# Patient Record
Sex: Female | Born: 1975 | Race: White | Hispanic: No | Marital: Single | Smoking: Former smoker
Health system: Southern US, Community
[De-identification: ages and names within clinical notes are randomized; demographics above are authoritative.]

## PROBLEM LIST (undated history)

## (undated) DIAGNOSIS — D696 Thrombocytopenia, unspecified: Secondary | ICD-10-CM

## (undated) DIAGNOSIS — Z992 Dependence on renal dialysis: Secondary | ICD-10-CM

## (undated) DIAGNOSIS — F329 Major depressive disorder, single episode, unspecified: Secondary | ICD-10-CM

## (undated) DIAGNOSIS — F319 Bipolar disorder, unspecified: Secondary | ICD-10-CM

## (undated) DIAGNOSIS — I8501 Esophageal varices with bleeding: Secondary | ICD-10-CM

## (undated) DIAGNOSIS — I9589 Other hypotension: Secondary | ICD-10-CM

## (undated) DIAGNOSIS — F32A Depression, unspecified: Secondary | ICD-10-CM

## (undated) DIAGNOSIS — K920 Hematemesis: Secondary | ICD-10-CM

## (undated) DIAGNOSIS — A0472 Enterocolitis due to Clostridium difficile, not specified as recurrent: Secondary | ICD-10-CM

## (undated) DIAGNOSIS — J189 Pneumonia, unspecified organism: Secondary | ICD-10-CM

## (undated) DIAGNOSIS — E538 Deficiency of other specified B group vitamins: Secondary | ICD-10-CM

## (undated) DIAGNOSIS — K7460 Unspecified cirrhosis of liver: Secondary | ICD-10-CM

## (undated) DIAGNOSIS — F419 Anxiety disorder, unspecified: Secondary | ICD-10-CM

## (undated) DIAGNOSIS — K259 Gastric ulcer, unspecified as acute or chronic, without hemorrhage or perforation: Secondary | ICD-10-CM

## (undated) DIAGNOSIS — R601 Generalized edema: Secondary | ICD-10-CM

## (undated) DIAGNOSIS — N186 End stage renal disease: Secondary | ICD-10-CM

## (undated) DIAGNOSIS — K652 Spontaneous bacterial peritonitis: Secondary | ICD-10-CM

## (undated) DIAGNOSIS — R188 Other ascites: Secondary | ICD-10-CM

## (undated) DIAGNOSIS — N179 Acute kidney failure, unspecified: Secondary | ICD-10-CM

## (undated) DIAGNOSIS — D7589 Other specified diseases of blood and blood-forming organs: Secondary | ICD-10-CM

## (undated) DIAGNOSIS — K219 Gastro-esophageal reflux disease without esophagitis: Secondary | ICD-10-CM

## (undated) DIAGNOSIS — D62 Acute posthemorrhagic anemia: Secondary | ICD-10-CM

## (undated) HISTORY — PX: OTHER SURGICAL HISTORY: SHX169

## (undated) HISTORY — PX: CENTRAL VENOUS CATHETER INSERTION: SHX401

---

## 2006-06-24 ENCOUNTER — Emergency Department (HOSPITAL_COMMUNITY): Admission: EM | Admit: 2006-06-24 | Discharge: 2006-06-24 | Payer: Self-pay | Admitting: Emergency Medicine

## 2013-09-10 DIAGNOSIS — N179 Acute kidney failure, unspecified: Secondary | ICD-10-CM

## 2013-09-10 DIAGNOSIS — E538 Deficiency of other specified B group vitamins: Secondary | ICD-10-CM

## 2013-09-10 HISTORY — DX: Acute kidney failure, unspecified: N17.9

## 2013-09-10 HISTORY — PX: PARACENTESIS: SHX844

## 2013-09-10 HISTORY — DX: Deficiency of other specified B group vitamins: E53.8

## 2013-09-11 ENCOUNTER — Emergency Department (HOSPITAL_COMMUNITY): Payer: Medicare Other

## 2013-09-11 ENCOUNTER — Encounter (HOSPITAL_COMMUNITY): Payer: Self-pay | Admitting: Emergency Medicine

## 2013-09-11 ENCOUNTER — Inpatient Hospital Stay (HOSPITAL_COMMUNITY)
Admission: EM | Admit: 2013-09-11 | Discharge: 2013-09-13 | DRG: 445 | Disposition: A | Discharge status: Home / Self Care | Payer: Medicare Other | Attending: Internal Medicine | Admitting: Internal Medicine

## 2013-09-11 DIAGNOSIS — K7031 Alcoholic cirrhosis of liver with ascites: Secondary | ICD-10-CM | POA: Diagnosis present

## 2013-09-11 DIAGNOSIS — D638 Anemia in other chronic diseases classified elsewhere: Secondary | ICD-10-CM | POA: Diagnosis present

## 2013-09-11 DIAGNOSIS — E860 Dehydration: Secondary | ICD-10-CM | POA: Diagnosis present

## 2013-09-11 DIAGNOSIS — K766 Portal hypertension: Secondary | ICD-10-CM | POA: Diagnosis present

## 2013-09-11 DIAGNOSIS — R188 Other ascites: Secondary | ICD-10-CM

## 2013-09-11 DIAGNOSIS — D72829 Elevated white blood cell count, unspecified: Secondary | ICD-10-CM | POA: Diagnosis present

## 2013-09-11 DIAGNOSIS — D539 Nutritional anemia, unspecified: Secondary | ICD-10-CM | POA: Diagnosis present

## 2013-09-11 DIAGNOSIS — D62 Acute posthemorrhagic anemia: Secondary | ICD-10-CM | POA: Diagnosis present

## 2013-09-11 DIAGNOSIS — K5289 Other specified noninfective gastroenteritis and colitis: Secondary | ICD-10-CM | POA: Diagnosis present

## 2013-09-11 DIAGNOSIS — R17 Unspecified jaundice: Secondary | ICD-10-CM | POA: Diagnosis present

## 2013-09-11 DIAGNOSIS — K219 Gastro-esophageal reflux disease without esophagitis: Secondary | ICD-10-CM | POA: Diagnosis present

## 2013-09-11 DIAGNOSIS — K838 Other specified diseases of biliary tract: Principal | ICD-10-CM | POA: Diagnosis present

## 2013-09-11 DIAGNOSIS — Z87891 Personal history of nicotine dependence: Secondary | ICD-10-CM

## 2013-09-11 DIAGNOSIS — K5900 Constipation, unspecified: Secondary | ICD-10-CM | POA: Diagnosis present

## 2013-09-11 DIAGNOSIS — Z8249 Family history of ischemic heart disease and other diseases of the circulatory system: Secondary | ICD-10-CM

## 2013-09-11 DIAGNOSIS — E8809 Other disorders of plasma-protein metabolism, not elsewhere classified: Secondary | ICD-10-CM | POA: Diagnosis present

## 2013-09-11 DIAGNOSIS — K529 Noninfective gastroenteritis and colitis, unspecified: Secondary | ICD-10-CM | POA: Diagnosis present

## 2013-09-11 DIAGNOSIS — E876 Hypokalemia: Secondary | ICD-10-CM | POA: Diagnosis present

## 2013-09-11 DIAGNOSIS — D529 Folate deficiency anemia, unspecified: Secondary | ICD-10-CM | POA: Diagnosis present

## 2013-09-11 DIAGNOSIS — E669 Obesity, unspecified: Secondary | ICD-10-CM | POA: Diagnosis present

## 2013-09-11 HISTORY — DX: Gastro-esophageal reflux disease without esophagitis: K21.9

## 2013-09-11 LAB — CBC WITH DIFFERENTIAL/PLATELET
BASOS ABS: 0.2 10*3/uL — AB (ref 0.0–0.1)
Basophils Relative: 1 % (ref 0–1)
EOS ABS: 0 10*3/uL (ref 0.0–0.7)
Eosinophils Relative: 0 % (ref 0–5)
HCT: 30.5 % — ABNORMAL LOW (ref 36.0–46.0)
HEMOGLOBIN: 10.3 g/dL — AB (ref 12.0–15.0)
LYMPHS PCT: 13 % (ref 12–46)
Lymphs Abs: 2.3 10*3/uL (ref 0.7–4.0)
MCH: 39.3 pg — ABNORMAL HIGH (ref 26.0–34.0)
MCHC: 33.8 g/dL (ref 30.0–36.0)
MCV: 116.4 fL — ABNORMAL HIGH (ref 78.0–100.0)
MONOS PCT: 7 % (ref 3–12)
Monocytes Absolute: 1.2 10*3/uL — ABNORMAL HIGH (ref 0.1–1.0)
Neutro Abs: 13.7 10*3/uL — ABNORMAL HIGH (ref 1.7–7.7)
Neutrophils Relative %: 79 % — ABNORMAL HIGH (ref 43–77)
Platelets: 345 10*3/uL (ref 150–400)
RBC: 2.62 MIL/uL — ABNORMAL LOW (ref 3.87–5.11)
RDW: 17.2 % — AB (ref 11.5–15.5)
WBC: 17.4 10*3/uL — AB (ref 4.0–10.5)

## 2013-09-11 LAB — URINE MICROSCOPIC-ADD ON

## 2013-09-11 LAB — COMPREHENSIVE METABOLIC PANEL
ALT: 34 U/L (ref 0–35)
AST: 104 U/L — AB (ref 0–37)
Albumin: 3 g/dL — ABNORMAL LOW (ref 3.5–5.2)
Alkaline Phosphatase: 168 U/L — ABNORMAL HIGH (ref 39–117)
BUN: 35 mg/dL — ABNORMAL HIGH (ref 6–23)
CALCIUM: 9.6 mg/dL (ref 8.4–10.5)
CO2: 29 mEq/L (ref 19–32)
CREATININE: 1.08 mg/dL (ref 0.50–1.10)
Chloride: 96 mEq/L (ref 96–112)
GFR calc Af Amer: 74 mL/min — ABNORMAL LOW (ref >90)
GFR calc non Af Amer: 64 mL/min — ABNORMAL LOW (ref >90)
Glucose, Bld: 118 mg/dL — ABNORMAL HIGH (ref 70–99)
Potassium: 2.6 mEq/L — CL (ref 3.7–5.3)
Sodium: 144 mEq/L (ref 137–147)
TOTAL PROTEIN: 7.7 g/dL (ref 6.0–8.3)
Total Bilirubin: 5.2 mg/dL — ABNORMAL HIGH (ref 0.3–1.2)

## 2013-09-11 LAB — MAGNESIUM: MAGNESIUM: 1.6 mg/dL (ref 1.5–2.5)

## 2013-09-11 LAB — URINALYSIS, ROUTINE W REFLEX MICROSCOPIC
Glucose, UA: 100 mg/dL — AB
LEUKOCYTES UA: NEGATIVE
NITRITE: NEGATIVE
PH: 5 (ref 5.0–8.0)
Protein, ur: 30 mg/dL — AB
Urobilinogen, UA: 4 mg/dL — ABNORMAL HIGH (ref 0.0–1.0)

## 2013-09-11 LAB — APTT: APTT: 34 s (ref 24–37)

## 2013-09-11 LAB — LACTIC ACID, PLASMA: LACTIC ACID, VENOUS: 1.7 mmol/L (ref 0.5–2.2)

## 2013-09-11 LAB — AMMONIA: AMMONIA: 46 umol/L (ref 11–60)

## 2013-09-11 LAB — PROTIME-INR
INR: 1.26 (ref 0.00–1.49)
Prothrombin Time: 15.5 seconds — ABNORMAL HIGH (ref 11.6–15.2)

## 2013-09-11 LAB — HCG, QUANTITATIVE, PREGNANCY: hCG, Beta Chain, Quant, S: <1 m[IU]/mL (ref <5)

## 2013-09-11 MED ORDER — CIPROFLOXACIN IN D5W 400 MG/200ML IV SOLN
400.0000 mg | Freq: Two times a day (BID) | INTRAVENOUS | Status: DC
  Administered 2013-09-12 (×2): 400 mg via INTRAVENOUS
  Filled 2013-09-11 (×4): qty 200

## 2013-09-11 MED ORDER — POTASSIUM CHLORIDE 10 MEQ/100ML IV SOLN
10.0000 meq | Freq: Once | INTRAVENOUS | Status: AC
  Administered 2013-09-11: 10 meq via INTRAVENOUS

## 2013-09-11 MED ORDER — SODIUM CHLORIDE 0.9 % IV BOLUS (SEPSIS)
1000.0000 mL | Freq: Once | INTRAVENOUS | Status: AC
  Administered 2013-09-11: 1000 mL via INTRAVENOUS

## 2013-09-11 MED ORDER — ALBUTEROL SULFATE (2.5 MG/3ML) 0.083% IN NEBU: 2.5000 mg | INHALATION_SOLUTION | RESPIRATORY_TRACT | Status: DC | PRN

## 2013-09-11 MED ORDER — SODIUM CHLORIDE 0.9 % IJ SOLN: 3.0000 mL | Freq: Two times a day (BID) | INTRAMUSCULAR | Status: DC

## 2013-09-11 MED ORDER — POTASSIUM CHLORIDE CRYS ER 20 MEQ PO TBCR
40.0000 meq | EXTENDED_RELEASE_TABLET | Freq: Once | ORAL | Status: AC
  Administered 2013-09-11: 40 meq via ORAL
  Filled 2013-09-11: qty 2

## 2013-09-11 MED ORDER — METRONIDAZOLE IN NACL 5-0.79 MG/ML-% IV SOLN
500.0000 mg | Freq: Three times a day (TID) | INTRAVENOUS | Status: DC
Start: 2013-09-11 — End: 2013-09-12
  Administered 2013-09-11: 500 mg via INTRAVENOUS
  Filled 2013-09-11 (×2): qty 100

## 2013-09-11 MED ORDER — POTASSIUM CHLORIDE 10 MEQ/100ML IV SOLN
INTRAVENOUS | Status: AC
  Filled 2013-09-11: qty 100

## 2013-09-11 MED ORDER — POTASSIUM CHLORIDE IN NACL 20-0.9 MEQ/L-% IV SOLN
INTRAVENOUS | Status: AC
  Administered 2013-09-11 – 2013-09-13 (×2): via INTRAVENOUS

## 2013-09-11 MED ORDER — POTASSIUM CHLORIDE 10 MEQ/100ML IV SOLN
10.0000 meq | Freq: Once | INTRAVENOUS | Status: AC
  Administered 2013-09-11: 10 meq via INTRAVENOUS
  Filled 2013-09-11: qty 100

## 2013-09-11 MED ORDER — ONDANSETRON HCL 4 MG PO TABS: 4.0000 mg | ORAL_TABLET | Freq: Four times a day (QID) | ORAL | Status: DC | PRN

## 2013-09-11 MED ORDER — ENOXAPARIN SODIUM 40 MG/0.4ML ~~LOC~~ SOLN
40.0000 mg | SUBCUTANEOUS | Status: DC
  Administered 2013-09-12: 40 mg via SUBCUTANEOUS
  Filled 2013-09-11: qty 0.4

## 2013-09-11 MED ORDER — ONDANSETRON HCL 4 MG/2ML IJ SOLN: 4.0000 mg | Freq: Four times a day (QID) | INTRAMUSCULAR | Status: DC | PRN

## 2013-09-11 MED ORDER — IOHEXOL 300 MG/ML  SOLN
50.0000 mL | Freq: Once | INTRAMUSCULAR | Status: AC | PRN
  Administered 2013-09-11: 50 mL via ORAL

## 2013-09-11 MED ORDER — IOHEXOL 300 MG/ML  SOLN
100.0000 mL | Freq: Once | INTRAMUSCULAR | Status: AC | PRN
  Administered 2013-09-11: 100 mL via INTRAVENOUS

## 2013-09-11 NOTE — ED Notes (Signed)
CRITICAL VALUE ALERT  Critical value received:  K-2.6  Date of notification:  09/11/13  Time of notification:  1819  Critical value read back:yes  Nurse who received alert:  T Gaines Cartmell RN  MD notified (1st page):    Time of first page:    MD notified (2nd page):  Time of second page:  Responding MD:  Roderic Palau  Time MD responded:

## 2013-09-11 NOTE — ED Notes (Signed)
Patient brought in via EMS. Airway patent. Alert and oriented. Airway patent. No acute distress noted. Per patient felt constipated with some flatulence x1 week, then today diarrhea darker in color "than usual" per patient. Denies any nausea or vomiting.  Patient reports generalized weakness. Patient reports decrease in urination. Denies any painful urination.

## 2013-09-11 NOTE — ED Provider Notes (Signed)
CSN: [id number]     Arrival date & time 09/11/13  1516 History   This chart was scribed for Maudry Diego, MD by Zettie Pho, ED Scribe. This patient was seen in room APA03/APA03 and the patient's care was started at 4:55 PM.    Chief Complaint  Patient presents with  . Fatigue  . Diarrhea   Patient is a 38 y.o. female presenting with diarrhea. The history is provided by the patient. No language interpreter was used.  Diarrhea Quality:  Semi-solid Severity:  Mild Onset quality:  Sudden Timing:  Intermittent Associated symptoms: no abdominal pain, no headaches and no vomiting    HPI Comments: Wanda [name] is a 38 y.o. female brought in by EMS who presents to the Emergency Department complaining of multiple episodes of thick, dark-colored diarrhea with associated fatigue, leg weakness, and decreased urine output onset yesterday. Patient reports noticing some blood on the toilet paper after a BM today. Patient reports some associated constipation and increased flatulence last week that has since resolved. She denies history of liver problems and does not drink. Patient has no other pertinent medical history.   History reviewed. No pertinent past medical history. History reviewed. No pertinent past surgical history. History reviewed. No pertinent family history. History  Substance Use Topics  . Smoking status: Former Smoker -- 0.25 packs/day for 20 years    Types: Cigarettes  . Smokeless tobacco: Never Used  . Alcohol Use: No   OB History   Grav Para Term Preterm Abortions TAB SAB Ect Mult Living   1 1 1       1      Review of Systems  Constitutional: Positive for fatigue. Negative for appetite change.  HENT: Negative for congestion, ear discharge and sinus pressure.   Eyes: Negative for discharge.  Respiratory: Negative for cough.   Cardiovascular: Negative for chest pain.  Gastrointestinal: Positive for diarrhea, constipation (resolved) and anal bleeding. Negative for vomiting  and abdominal pain.  Genitourinary: Positive for decreased urine volume. Negative for frequency and hematuria.  Musculoskeletal: Negative for back pain.  Skin: Negative for rash.  Neurological: Positive for weakness. Negative for seizures and headaches.  Psychiatric/Behavioral: Negative for hallucinations.    Allergies  Review of patient's allergies indicates no known allergies.  Home Medications  No current outpatient prescriptions on file.  Triage Vitals: BP 109/59  Pulse 122  Temp(Src) 98.6 F (37 C) (Oral)  Resp 18  Ht 5\' 2"  (1.575 m)  Wt 200 lb (90.719 kg)  BMI 36.57 kg/m2  SpO2 98%  LMP 08/28/2013  Physical Exam  Constitutional: She is oriented to person, place, and time. She appears well-developed.  HENT:  Head: Normocephalic.  Dry mucus membranes.  Eyes: Conjunctivae and EOM are normal. No scleral icterus.  Neck: Neck supple. No thyromegaly present.  Cardiovascular: Normal rate and regular rhythm.  Exam reveals no gallop and no friction rub.   No murmur heard. Pulmonary/Chest: No stridor. She has no wheezes. She has no rales. She exhibits no tenderness.  Abdominal: She exhibits no distension. There is no tenderness. There is no rebound.  Musculoskeletal: Normal range of motion. She exhibits no edema.  Lymphadenopathy:    She has no cervical adenopathy.  Neurological: She is oriented to person, place, and time. She exhibits normal muscle tone. Coordination normal.  Skin: No rash noted. No erythema.  Psychiatric: She has a normal mood and affect. Her behavior is normal.    ED Course  Procedures (including critical care time)  DIAGNOSTIC STUDIES: Oxygen Saturation is 98% on room air, normal by my interpretation.    COORDINATION OF CARE: 4:59 PM- Will order an x-ray of the abdomen, blood labs, and UA. Will order IV fluids to manage symptoms. Discussed treatment plan with patient at bedside and patient verbalized agreement.   7:15 PM- Discussed that lab results  indicated an elevated WBC. Discussed that the x-ray of the abdomen was normal. Will order a CT of the abdomen. Will order Omnipaque and potassium chloride to manage symptoms. Discussed treatment plan with patient at bedside and patient verbalized agreement.   9:45 PM- Discussed that CT results indicate some fluid retention in the abdomen and some intestinal inflammation. Discussed that the patient may need to be admitted to the hospital. Discussed treatment plan with patient at bedside and patient verbalized agreement.   Labs Review Labs Reviewed  CBC WITH DIFFERENTIAL - Abnormal; Notable for the following:    WBC 17.4 (*)    RBC 2.62 (*)    Hemoglobin 10.3 (*)    HCT 30.5 (*)    MCV 116.4 (*)    MCH 39.3 (*)    RDW 17.2 (*)    Neutrophils Relative % 79 (*)    Neutro Abs 13.7 (*)    Monocytes Absolute 1.2 (*)    Basophils Absolute 0.2 (*)    All other components within normal limits  COMPREHENSIVE METABOLIC PANEL - Abnormal; Notable for the following:    Potassium 2.6 (*)    Glucose, Bld 118 (*)    BUN 35 (*)    Albumin 3.0 (*)    AST 104 (*)    Alkaline Phosphatase 168 (*)    Total Bilirubin 5.2 (*)    GFR calc non Af Amer 64 (*)    GFR calc Af Amer 74 (*)    All other components within normal limits  HCG, QUANTITATIVE, PREGNANCY  LACTIC ACID, PLASMA  URINALYSIS, ROUTINE W REFLEX MICROSCOPIC   Imaging Review Dg Abd Acute W/chest  09/11/2013   CLINICAL DATA:  Abdominal pain. Diarrhea. Decreased urine output. Weakness.  EXAM: ACUTE ABDOMEN SERIES (ABDOMEN 2 VIEW & CHEST 1 VIEW)  COMPARISON:  None.  FINDINGS: There is no evidence of dilated bowel loops or free intraperitoneal air. No radiopaque calculi or other significant radiographic abnormality is seen. Heart size and mediastinal contours are within normal limits. Both lungs are clear.  IMPRESSION: Negative abdominal radiographs.  No acute cardiopulmonary disease.   Electronically Signed   By: Earle Gell M.D.   On: 09/11/2013  18:38   Ct Abdomen Pelvis W Contrast  09/11/2013   CLINICAL DATA:  Diarrhea.  Leg weakness.  Gastrointestinal bleeding.  EXAM: CT ABDOMEN AND PELVIS WITH CONTRAST  TECHNIQUE: Multidetector CT imaging of the abdomen and pelvis was performed using the standard protocol following bolus administration of intravenous contrast.  CONTRAST:  83mL OMNIPAQUE IOHEXOL 300 MG/ML SOLN, 145mL OMNIPAQUE IOHEXOL 300 MG/ML SOLN  COMPARISON:  None.  FINDINGS: Lung bases are clear.  No pleural or pericardial fluid.  There is a large amount of ascites distributed freely throughout the peritoneal space. No loculated components. The liver is markedly enlarged and extremely fatty. Certain areas of the liver show extreme fatty change, particularly the ventral aspect and the caudate lobe. No suspicion of mass lesion. No calcified gallstones. No sign of ductal dilatation. The spleen is normal. Hepatic vein and portal veins are patent. The pancreas is normal. The adrenal glands are normal. The kidneys are normal. The aorta shows  atherosclerosis but no aneurysm. The IVC is normal. No retroperitoneal mass or adenopathy. No free intraperitoneal air. No definite focal bowel lesion. The colon may show slight wall thickening diffusely which could be seen with colitis or with low systemic protein. Uterus and adnexal regions appear normal. The appendix is normal. No bony abnormality.  IMPRESSION: Ascites diffusely distributed.  Profound hepatomegaly and fatty change of the liver.  Possible wall thickening of the colon, which could be seen with colitis or with low protein.  Considerations include primary steatosis, toxin exposure, and viral hepatitis.   Electronically Signed   By: Nelson Chimes M.D.   On: 09/11/2013 21:18    EKG Interpretation   None       MDM  The chart was scribed for me under my direct supervision.  I personally performed the history, physical, and medical decision making and all procedures in the evaluation of this  patient.Maudry Diego, MD 09/11/13 2151

## 2013-09-11 NOTE — H&P (Signed)
[facility] History and Physical  DEANETTE TULLIUS DVV:[id number] DOB: 05-Aug-1976 DOA: 09/11/2013   PCP: None Specialists: None  Chief Complaint: Diarrhea and weakness  HPI: Wanda [name] is a 38 y.o. female with no significant past medical history. Patient is not a great historian. She tells me that for the past 2 weeks she has been constipated. She has had decreased by mouth intake in the last few weeks because of cough and cold like symptoms which is now better. Denies any fever or chills. No nausea, vomiting. She denies taking any laxatives or stool softeners or pepto bismol. She has been having bowel movements, but she's had to strain a lot, and stool has been extremely hard. And then yesterday she was able to pass a significant amount of stool and then subsequently started having loose bowel movements. Denies any recent antibiotic use. She has felt weak. Has felt dizzy, but denies any syncopal episodes. No wheezing. No chest pain. She's never had similar symptoms before. She has also noted her abdomen to be distended. But she hadn't realized that her eyes were yellow.  Home Medications: Prior to Admission medications   Not on File    Allergies: No Known Allergies  Past Medical History: She denies any medical problems in the past. Denies any surgeries in the past.  Social History: She lives with her fiance. Currently, is unemployed. Denies any smoking or illicit drug use. She absolutely denies any alcohol use in the last 5 years. She also tells me that she wasn't a heavy drinker before that either. Usually independent with daily activities.  Family History:  Family History  Problem Relation Age of Onset  . Heart disease Mother      Review of Systems - History obtained from the patient General ROS: positive for  - fatigue Psychological ROS: negative Ophthalmic ROS: negative ENT ROS: negative Allergy and Immunology ROS: negative Hematological and Lymphatic ROS:  negative Endocrine ROS: negative Respiratory ROS: no cough, shortness of breath, or wheezing Cardiovascular ROS: no chest pain or dyspnea on exertion Gastrointestinal ROS: as in hpi Genito-Urinary ROS: decreased urination Musculoskeletal ROS: negative Neurological ROS: no TIA or stroke symptoms Dermatological ROS: negative  Physical Examination  Filed Vitals:   09/11/13 1801 09/11/13 2007 09/11/13 2008 09/11/13 2009  BP: 113/52 101/52 103/52 92/57  Pulse: 114 110 113 117  Temp: 98.4 F (36.9 C)     TempSrc:      Resp: 20     Height:      Weight:      SpO2: 100%       General appearance: alert, cooperative, appears stated age and no distress Head: Normocephalic, without obvious abnormality, atraumatic Eyes: Pupils are equal, reacting. Extraocular movements are intact. Icterus is appreciated. Throat: lips, mucosa, and tongue normal; teeth and gums normal Neck: no adenopathy, no carotid bruit, no JVD, supple, symmetrical, trachea midline and thyroid not enlarged, symmetric, no tenderness/mass/nodules Resp: clear to auscultation bilaterally Cardio: regular rate and rhythm, S1, S2 normal, no murmur, click, rub or gallop GI: Abdomen is distended. Ascites is present. Hepatomegaly is appreciated. No areas of tenderness. Bowel sounds are present. Extremities: Some edema is appreciated but no pitting is noted Pulses: 2+ and symmetric Skin: Jaundice is present Lymph nodes: Cervical, supraclavicular, and axillary nodes normal. Neurologic: Alert and oriented X 3, normal strength and tone. Normal symmetric reflexes. Normal coordination and gait  Laboratory Data: Results for orders placed during the hospital encounter of 09/11/13 (from the past 48 hour(s))  CBC WITH DIFFERENTIAL     Status: Abnormal   Collection Time    09/11/13  5:25 PM      Result Value Range   WBC 17.4 (*) 4.0 - 10.5 K/uL   RBC 2.62 (*) 3.87 - 5.11 MIL/uL   Hemoglobin 10.3 (*) 12.0 - 15.0 g/dL   HCT 30.5 (*) 36.0  - 46.0 %   MCV 116.4 (*) 78.0 - 100.0 fL   MCH 39.3 (*) 26.0 - 34.0 pg   MCHC 33.8  30.0 - 36.0 g/dL   RDW 17.2 (*) 11.5 - 15.5 %   Platelets 345  150 - 400 K/uL   Neutrophils Relative % 79 (*) 43 - 77 %   Lymphocytes Relative 13  12 - 46 %   Monocytes Relative 7  3 - 12 %   Eosinophils Relative 0  0 - 5 %   Basophils Relative 1  0 - 1 %   Neutro Abs 13.7 (*) 1.7 - 7.7 K/uL   Lymphs Abs 2.3  0.7 - 4.0 K/uL   Monocytes Absolute 1.2 (*) 0.1 - 1.0 K/uL   Eosinophils Absolute 0.0  0.0 - 0.7 K/uL   Basophils Absolute 0.2 (*) 0.0 - 0.1 K/uL   RBC Morphology POLYCHROMASIA PRESENT     Comment: RARE NRBCs     TARGET CELLS   WBC Morphology WHITE COUNT CONFIRMED ON SMEAR     Comment: INCREASED BANDS (>20% BANDS)     VACUOLATED NEUTROPHILS   Smear Review LARGE PLATELETS PRESENT     Comment: GIANT PLATELETS SEEN  COMPREHENSIVE METABOLIC PANEL     Status: Abnormal   Collection Time    09/11/13  5:25 PM      Result Value Range   Sodium 144  137 - 147 mEq/L   Potassium 2.6 (*) 3.7 - 5.3 mEq/L   Comment: CRITICAL RESULT CALLED TO, READ BACK BY AND VERIFIED WITH:     VOGLER,T ON 09/11/13 AT 1820 BY LOY,C   Chloride 96  96 - 112 mEq/L   CO2 29  19 - 32 mEq/L   Glucose, Bld 118 (*) 70 - 99 mg/dL   BUN 35 (*) 6 - 23 mg/dL   Creatinine, Ser 1.08  0.50 - 1.10 mg/dL   Calcium 9.6  8.4 - 10.5 mg/dL   Total Protein 7.7  6.0 - 8.3 g/dL   Albumin 3.0 (*) 3.5 - 5.2 g/dL   AST 104 (*) 0 - 37 U/L   ALT 34  0 - 35 U/L   Alkaline Phosphatase 168 (*) 39 - 117 U/L   Total Bilirubin 5.2 (*) 0.3 - 1.2 mg/dL   GFR calc non Af Amer 64 (*) >90 mL/min   GFR calc Af Amer 74 (*) >90 mL/min   Comment: (NOTE)     The eGFR has been calculated using the CKD EPI equation.     This calculation has not been validated in all clinical situations.     eGFR's persistently <90 mL/min signify possible Chronic Kidney     Disease.  HCG, QUANTITATIVE, PREGNANCY     Status: None   Collection Time    09/11/13  5:25 PM       Result Value Range   hCG, Beta Chain, Quant, S <1  <5 mIU/mL   Comment:              GEST. AGE      CONC.  (mIU/mL)       <=1 WEEK  5 - 50         2 WEEKS       50 - 500         3 WEEKS       100 - 10,000         4 WEEKS     1,000 - 30,000         5 WEEKS     3,500 - 115,000       6-8 WEEKS     12,000 - 270,000        12 WEEKS     15,000 - 220,000                FEMALE AND NON-PREGNANT FEMALE:         LESS THAN 5 mIU/mL  LACTIC ACID, PLASMA     Status: None   Collection Time    09/11/13  7:20 PM      Result Value Range   Lactic Acid, Venous 1.7  0.5 - 2.2 mmol/L  URINALYSIS, ROUTINE W REFLEX MICROSCOPIC     Status: Abnormal   Collection Time    09/11/13  9:45 PM      Result Value Range   Color, Urine AMBER (*) YELLOW   Comment: BIOCHEMICALS MAY BE AFFECTED BY COLOR   APPearance CLEAR  CLEAR   Specific Gravity, Urine <1.005 (*) 1.005 - 1.030   pH 5.0  5.0 - 8.0   Glucose, UA 100 (*) NEGATIVE mg/dL   Hgb urine dipstick LARGE (*) NEGATIVE   Bilirubin Urine LARGE (*) NEGATIVE   Ketones, ur TRACE (*) NEGATIVE mg/dL   Protein, ur 30 (*) NEGATIVE mg/dL   Urobilinogen, UA 4.0 (*) 0.0 - 1.0 mg/dL   Nitrite NEGATIVE  NEGATIVE   Leukocytes, UA NEGATIVE  NEGATIVE  URINE MICROSCOPIC-ADD ON     Status: Abnormal   Collection Time    09/11/13  9:45 PM      Result Value Range   Squamous Epithelial / LPF FEW (*) RARE   WBC, UA 3-6  <3 WBC/hpf   RBC / HPF 7-10  <3 RBC/hpf   Bacteria, UA FEW (*) RARE    Radiology Reports: Ct Abdomen Pelvis W Contrast  09/11/2013   CLINICAL DATA:  Diarrhea.  Leg weakness.  Gastrointestinal bleeding.  EXAM: CT ABDOMEN AND PELVIS WITH CONTRAST  TECHNIQUE: Multidetector CT imaging of the abdomen and pelvis was performed using the standard protocol following bolus administration of intravenous contrast.  CONTRAST:  67m OMNIPAQUE IOHEXOL 300 MG/ML SOLN, 1064mOMNIPAQUE IOHEXOL 300 MG/ML SOLN  COMPARISON:  None.  FINDINGS: Lung bases are clear.  No pleural  or pericardial fluid.  There is a large amount of ascites distributed freely throughout the peritoneal space. No loculated components. The liver is markedly enlarged and extremely fatty. Certain areas of the liver show extreme fatty change, particularly the ventral aspect and the caudate lobe. No suspicion of mass lesion. No calcified gallstones. No sign of ductal dilatation. The spleen is normal. Hepatic vein and portal veins are patent. The pancreas is normal. The adrenal glands are normal. The kidneys are normal. The aorta shows atherosclerosis but no aneurysm. The IVC is normal. No retroperitoneal mass or adenopathy. No free intraperitoneal air. No definite focal bowel lesion. The colon may show slight wall thickening diffusely which could be seen with colitis or with low systemic protein. Uterus and adnexal regions appear normal. The appendix is normal. No bony abnormality.  IMPRESSION: Ascites diffusely distributed.  Profound hepatomegaly and fatty change of the liver.  Possible wall thickening of the colon, which could be seen with colitis or with low protein.  Considerations include primary steatosis, toxin exposure, and viral hepatitis.   Electronically Signed   By: Nelson Chimes M.D.   On: 09/11/2013 21:18   Dg Abd Acute W/chest  09/11/2013   CLINICAL DATA:  Abdominal pain. Diarrhea. Decreased urine output. Weakness.  EXAM: ACUTE ABDOMEN SERIES (ABDOMEN 2 VIEW & CHEST 1 VIEW)  COMPARISON:  None.  FINDINGS: There is no evidence of dilated bowel loops or free intraperitoneal air. No radiopaque calculi or other significant radiographic abnormality is seen. Heart size and mediastinal contours are within normal limits. Both lungs are clear.  IMPRESSION: Negative abdominal radiographs.  No acute cardiopulmonary disease.   Electronically Signed   By: Earle Gell M.D.   On: 09/11/2013 18:38    Problem List  Principal Problem:   Jaundice Active Problems:   Colitis   Ascites   Dehydration   Hypokalemia    Macrocytic anemia   Assessment: This is a 38 year old, Caucasian female, who presents with weakness, and loose stools. She was noted to have jaundice. She has elevated bilirubin, as well as elevated AST. She absolutely denies any alcohol intake. The reason for her abnormal LFTs and hepatomegaly is not entirely clear at this time. Differential diagnosis is broad. She's also dehydrated, and has hypokalemia.  Plan: #1 diarrhea after weeks of constipation: CT raises possibility of colitis. We will initiate ciprofloxacin and Flagyl for now considering leukocytosis and bandemia. Stool will be sent for C. difficile. Lactic acid level is normal.  #2 dehydration with hyperkalemia: She'll be given IV fluids. Potassium will be repleted. Magnesium level be checked.  #3 hyperbilirubinemia/elevated AST/hepatomegaly: She could have NASH if what she states about her alcohol intake is true. We will check ammonia. Check PT/INR. Ultrasound of the abdomen will be ordered for tomorrow. Bilirubin will be fractionated. She does have ascites, however, abdomen is not tender so SBP is less likely. Consult gastroenterology. Hepatitis panel will be ordered as well. Further workup to be considered based on results of these tests. She may end up requiring liver biopsy at some point.  #4 microcytic anemia: Check anemia panel and TSH.   DVT Prophylaxis: Lovenox if her PT/INR are normal Code Status: Full code Family Communication: Discussed with the patient  Disposition Plan: Admit to telemetry   Further management decisions will depend on results of further testing and patient's response to treatment.  [facility]  [facility] Pager [phone]  If 7PM-7AM, please contact night-coverage [email or web address] Password [email or web address]  09/11/2013, 10:22 PM

## 2013-09-12 ENCOUNTER — Inpatient Hospital Stay (HOSPITAL_COMMUNITY): Payer: Medicare Other

## 2013-09-12 ENCOUNTER — Encounter (HOSPITAL_COMMUNITY): Payer: Self-pay | Admitting: Gastroenterology

## 2013-09-12 DIAGNOSIS — D72829 Elevated white blood cell count, unspecified: Secondary | ICD-10-CM | POA: Diagnosis present

## 2013-09-12 DIAGNOSIS — R17 Unspecified jaundice: Secondary | ICD-10-CM

## 2013-09-12 DIAGNOSIS — D62 Acute posthemorrhagic anemia: Secondary | ICD-10-CM

## 2013-09-12 DIAGNOSIS — K5289 Other specified noninfective gastroenteritis and colitis: Secondary | ICD-10-CM

## 2013-09-12 LAB — VITAMIN B12: Vitamin B-12: 741 pg/mL (ref 211–911)

## 2013-09-12 LAB — ALBUMIN, FLUID (OTHER): Albumin, Fluid: 0.7 g/dL

## 2013-09-12 LAB — COMPREHENSIVE METABOLIC PANEL
ALT: 27 U/L (ref 0–35)
AST: 89 U/L — ABNORMAL HIGH (ref 0–37)
Albumin: 2.3 g/dL — ABNORMAL LOW (ref 3.5–5.2)
Alkaline Phosphatase: 127 U/L — ABNORMAL HIGH (ref 39–117)
BUN: 30 mg/dL — ABNORMAL HIGH (ref 6–23)
CALCIUM: 7.8 mg/dL — AB (ref 8.4–10.5)
CO2: 23 mEq/L (ref 19–32)
Chloride: 102 mEq/L (ref 96–112)
Creatinine, Ser: 0.89 mg/dL (ref 0.50–1.10)
GFR calc non Af Amer: 81 mL/min — ABNORMAL LOW (ref >90)
GLUCOSE: 89 mg/dL (ref 70–99)
Potassium: 3 mEq/L — ABNORMAL LOW (ref 3.7–5.3)
Sodium: 140 mEq/L (ref 137–147)
Total Bilirubin: 3.8 mg/dL — ABNORMAL HIGH (ref 0.3–1.2)
Total Protein: 5.8 g/dL — ABNORMAL LOW (ref 6.0–8.3)

## 2013-09-12 LAB — IRON AND TIBC: Iron: 112 ug/dL (ref 42–135)

## 2013-09-12 LAB — FOLATE: Folate: 1.3 ng/mL — ABNORMAL LOW

## 2013-09-12 LAB — BILIRUBIN, DIRECT: Bilirubin, Direct: 2.7 mg/dL — ABNORMAL HIGH (ref 0.0–0.3)

## 2013-09-12 LAB — HEPATITIS PANEL, ACUTE
HCV Ab: NEGATIVE
Hep A IgM: NONREACTIVE
Hep B C IgM: NONREACTIVE
Hepatitis B Surface Ag: NEGATIVE

## 2013-09-12 LAB — BODY FLUID CELL COUNT WITH DIFFERENTIAL
Eos, Fluid: 1 %
LYMPHS FL: 34 %
Monocyte-Macrophage-Serous Fluid: 50 % (ref 50–90)
NEUTROPHIL FLUID: 15 % (ref 0–25)
Total Nucleated Cell Count, Fluid: 218 cu mm (ref 0–1000)

## 2013-09-12 LAB — RETICULOCYTES
RBC.: 1.96 MIL/uL — AB (ref 3.87–5.11)
Retic Count, Absolute: 49 10*3/uL (ref 19.0–186.0)
Retic Ct Pct: 2.5 % (ref 0.4–3.1)

## 2013-09-12 LAB — RAPID URINE DRUG SCREEN, HOSP PERFORMED
Amphetamines: NOT DETECTED
BENZODIAZEPINES: NOT DETECTED
Barbiturates: NOT DETECTED
COCAINE: NOT DETECTED
OPIATES: NOT DETECTED
Tetrahydrocannabinol: NOT DETECTED

## 2013-09-12 LAB — TSH: TSH: 3.713 u[IU]/mL (ref 0.350–4.500)

## 2013-09-12 LAB — ACETAMINOPHEN LEVEL: Acetaminophen (Tylenol), Serum: <15 ug/mL (ref 10–30)

## 2013-09-12 LAB — CBC
HCT: 23 % — ABNORMAL LOW (ref 36.0–46.0)
HEMOGLOBIN: 7.8 g/dL — AB (ref 12.0–15.0)
MCH: 39.8 pg — ABNORMAL HIGH (ref 26.0–34.0)
MCHC: 33.9 g/dL (ref 30.0–36.0)
MCV: 117.3 fL — AB (ref 78.0–100.0)
Platelets: 244 10*3/uL (ref 150–400)
RBC: 1.96 MIL/uL — ABNORMAL LOW (ref 3.87–5.11)
RDW: 17.8 % — ABNORMAL HIGH (ref 11.5–15.5)
WBC: 13.1 10*3/uL — AB (ref 4.0–10.5)

## 2013-09-12 LAB — ETHANOL: Alcohol, Ethyl (B): <11 mg/dL (ref 0–11)

## 2013-09-12 LAB — PROTIME-INR
INR: 1.28 (ref 0.00–1.49)
Prothrombin Time: 15.7 seconds — ABNORMAL HIGH (ref 11.6–15.2)

## 2013-09-12 LAB — CLOSTRIDIUM DIFFICILE BY PCR: Toxigenic C. Difficile by PCR: NEGATIVE

## 2013-09-12 LAB — FERRITIN: FERRITIN: 585 ng/mL — AB (ref 10–291)

## 2013-09-12 LAB — HIV ANTIBODY (ROUTINE TESTING W REFLEX): HIV: NONREACTIVE

## 2013-09-12 LAB — APTT: APTT: 33 s (ref 24–37)

## 2013-09-12 MED ORDER — FUROSEMIDE 10 MG/ML IJ SOLN
40.0000 mg | Freq: Every day | INTRAMUSCULAR | Status: DC
  Administered 2013-09-13: 40 mg via INTRAVENOUS
  Filled 2013-09-12: qty 4

## 2013-09-12 MED ORDER — METRONIDAZOLE IN NACL 5-0.79 MG/ML-% IV SOLN
500.0000 mg | Freq: Three times a day (TID) | INTRAVENOUS | Status: DC
Start: 2013-09-12 — End: 2013-09-12
  Administered 2013-09-12 (×2): 500 mg via INTRAVENOUS
  Filled 2013-09-12 (×4): qty 100

## 2013-09-12 MED ORDER — METRONIDAZOLE 500 MG PO TABS
500.0000 mg | ORAL_TABLET | Freq: Three times a day (TID) | ORAL | Status: DC
  Administered 2013-09-12 – 2013-09-13 (×2): 500 mg via ORAL
  Filled 2013-09-12 (×2): qty 1

## 2013-09-12 MED ORDER — ZOLPIDEM TARTRATE 5 MG PO TABS
5.0000 mg | ORAL_TABLET | Freq: Once | ORAL | Status: AC
  Administered 2013-09-12: 5 mg via ORAL
  Filled 2013-09-12: qty 1

## 2013-09-12 MED ORDER — LIDOCAINE HCL (PF) 1 % IJ SOLN
INTRAMUSCULAR | Status: AC
  Filled 2013-09-12: qty 5

## 2013-09-12 MED ORDER — CIPROFLOXACIN IN D5W 400 MG/200ML IV SOLN
INTRAVENOUS | Status: AC
  Filled 2013-09-12: qty 200

## 2013-09-12 MED ORDER — POTASSIUM CHLORIDE CRYS ER 20 MEQ PO TBCR
40.0000 meq | EXTENDED_RELEASE_TABLET | Freq: Once | ORAL | Status: AC
  Administered 2013-09-12: 40 meq via ORAL
  Filled 2013-09-12: qty 2

## 2013-09-12 MED ORDER — PANTOPRAZOLE SODIUM 40 MG PO TBEC
40.0000 mg | DELAYED_RELEASE_TABLET | Freq: Every day | ORAL | Status: DC
  Administered 2013-09-13: 40 mg via ORAL
  Filled 2013-09-12 (×2): qty 1

## 2013-09-12 MED ORDER — CIPROFLOXACIN HCL 250 MG PO TABS
500.0000 mg | ORAL_TABLET | Freq: Two times a day (BID) | ORAL | Status: DC
  Administered 2013-09-12 – 2013-09-13 (×2): 500 mg via ORAL
  Filled 2013-09-12 (×2): qty 2

## 2013-09-12 MED ORDER — SPIRONOLACTONE 25 MG PO TABS
100.0000 mg | ORAL_TABLET | Freq: Every day | ORAL | Status: DC
  Administered 2013-09-13: 100 mg via ORAL
  Filled 2013-09-12: qty 4

## 2013-09-12 NOTE — Progress Notes (Signed)
[facility] PROGRESS NOTE  Wanda [name] JME:[id number] DOB: 12-13-1975 DOA: 09/11/2013 PCP: No primary provider on file.  Brief narrative: 38 y.o. female with no significant past medical history who presented to [facility] 09/11/2013 with decreased by mouth intake for past few weeks due to flu like symptoms and then subsequently over past 1-2 days increased abdominal girth, jaundice and diarrhea. CT abd doen in ED revealed ascites diffusely, profound hepatomegaly and fatty liver; possible wall thickening of the colon. Pt was started on cipro and flagyl on the admission for possible colitis.   Assessment/Plan:  Principal Problem:   Obstructive jaundice - AST/ALT 89/27; ALP 127, Bilirubin total 3.8 and direct bili 2.7 - CT abdomen revealed ascites diffusely, profound hepatomegaly and fatty liver; possible wall thickening of the colon - follow up abd [place], alcohol level, UDS, tylenol level - follow up on acute hepatitis panel - appreciate GI consult and their recomendations Active Problems:   Diarrhea - rule out C.diff - continue IV fluids   Dehydration - possible viral etiology, colitis - continue IV fluids    Hypokalemia - secondary to GI losses - repleted in IV fludis   Leukocytosis, unspecified - likely due to colitis - continue cipro and flagyl    Acute blood loss anemia - reported dark stool - hemoglobin 10.3 on admission and now 7.8, possibly dilutional - repeat CBC this am and if still low will give 1 unit PRBC transfusion    Code Status: full code Family Communication: no family at the bedside Disposition Plan: remains inpatient   Leisa Lenz, MD  [facility] Pager [phone]  If 7PM-7AM, please contact night-coverage [email or web address] Password [email or web address] 09/12/2013, 10:48 AM   LOS: 1 day   Consultants:  Gastroenterology   Procedures:  None   Antibiotics:  cipro 09/11/2013 -->  Flagyl 09/11/2013 -->  HPI/Subjective: No acute overnight events.    Objective: Filed Vitals:   09/11/13 2232 09/11/13 2331 09/12/13 0521 09/12/13 0939  BP: 98/58 92/59 91/56    Pulse: 108 111 111 109  Temp:  98 F (36.7 C) 98 F (36.7 C)   TempSrc:  Oral Oral   Resp: 16 18 18 18   Height:  5\' 2"  (1.575 m)    Weight:  80.604 kg (177 lb 11.2 oz)    SpO2: 98% 95% 97% 98%   No intake or output data in the 24 hours ending 09/12/13 1048  Exam:   General:  Pt is alert, follows commands appropriately, not in acute distress; icteric sclera improving   Cardiovascular: Regular rate and rhythm, S1/S2, no murmurs, no rubs, no gallops  Respiratory: Clear to auscultation bilaterally, no wheezing, no crackles, no rhonchi  Abdomen: Soft, non tender, non distended, bowel sounds present, no guarding  Extremities: No edema, pulses DP and PT palpable bilaterally  Neuro: Grossly nonfocal  Data Reviewed: Basic Metabolic Panel:  Recent Labs Lab 09/11/13 1725 09/11/13 2215 09/12/13 0626  NA 144  --  140  K 2.6*  --  3.0*  CL 96  --  102  CO2 29  --  23  GLUCOSE 118*  --  89  BUN 35*  --  30*  CREATININE 1.08  --  0.89  CALCIUM 9.6  --  7.8*  MG  --  1.6  --    Liver Function Tests:  Recent Labs Lab 09/11/13 1725 09/12/13 0626  AST 104* 89*  ALT 34 27  ALKPHOS 168* 127*  BILITOT 5.2* 3.8*  PROT 7.7 5.8*  ALBUMIN 3.0*  2.3*   No results found for this basename: LIPASE, AMYLASE,  in the last 168 hours  Recent Labs Lab 09/11/13 2215  AMMONIA 46   CBC:  Recent Labs Lab 09/11/13 1725 09/12/13 0626  WBC 17.4* 13.1*  NEUTROABS 13.7*  --   HGB 10.3* 7.8*  HCT 30.5* 23.0*  MCV 116.4* 117.3*  PLT 345 244   Cardiac Enzymes: No results found for this basename: CKTOTAL, CKMB, CKMBINDEX, TROPONINI,  in the last 168 hours BNP: No components found with this basename: POCBNP,  CBG: No results found for this basename: GLUCAP,  in the last 168 hours  No results found for this or any previous visit (from the past 240 hour(s)).    Studies: Ct Abdomen Pelvis W Contrast 09/11/2013    IMPRESSION: Ascites diffusely distributed.  Profound hepatomegaly and fatty change of the liver.  Possible wall thickening of the colon, which could be seen with colitis or with low protein.  Considerations include primary steatosis, toxin exposure, and viral hepatitis.    Dg Abd Acute W/chest 09/11/2013   MPRESSION: Negative abdominal radiographs.  No acute cardiopulmonary disease.      Scheduled Meds: . ciprofloxacin  400 mg Intravenous Q12H  . metronidazole  500 mg Intravenous Q8H  . [START ON 09/13/2013] pantoprazole  40 mg Oral QAC breakfast  . potassium chloride  40 mEq Oral Once  . sodium chloride  3 mL Intravenous Q12H   Continuous Infusions: . 0.9 % NaCl with KCl 20 mEq / L 100 mL/hr at 09/11/13 2359

## 2013-09-12 NOTE — Progress Notes (Signed)
Paracentesis complete no signs of distress. 1123ml yellow colored abdominal fluid removed.

## 2013-09-12 NOTE — Consult Note (Signed)
Referring Provider: Dr. [place] Pink Primary Care Physician:  No primary provider on file. Primary Gastroenterologist:  Dr. Oneida Alar   Date of Admission: 09/11/13 Date of Consultation: 09/12/13  Reason for Consultation:  Jaundice  HPI:  Wanda [name] is a 38 year old female who presented to the ED with diarrhea, weakness, dizziness, abdominal distension. Found to be jaundiced, with Tbili 5.2 on admission. AST 104, ALT normal. CT showed diffuse ascites, profound hepatomegaly and fatty liver. Possible wall-thickening of colon.   Patient states she had been constipated for about 2 weeks. States unusual for her. Felt like she had to go but couldn't. Had to pull out small pieces, balls of stool with a wipe. Yesterday soft, mushy, thick stool. Now with watery diarrhea. Feels like she has to go about every hour. No rectal bleeding. Denies melena.  Believes she is "spotting" due to menses. No recent antibiotics. Decided to come to ED after having diarrhea, stating thighs didn't want to work, felt weak. Denies abdominal pain. No N/V. A few weeks ago had cold/sinus issues. States she did not notice any yellowing of her eyes or skin until she was told in the ED. No fever, chills. Denies any prior liver disease. Denies any unexplained weight loss, appetite changes.   Denies drinking any alcohol. No IV drug use. No tattoos.  Denies Tylenol use. Took 1 aspirin recently. No OTC herbal medications. Vague historian.   Past Medical History  Diagnosis Date  . GERD (gastroesophageal reflux disease)     Past Surgical History  Procedure Laterality Date  . None      Prior to Admission medications   Not on File    Current Facility-Administered Medications  Medication Dose Route Frequency Provider Last Rate Last Dose  . 0.9 % NaCl with KCl 20 mEq/ L  infusion   Intravenous Continuous Bonnielee Haff, MD 100 mL/hr at 09/11/13 2359    . albuterol (PROVENTIL) (2.5 MG/3ML) 0.083% nebulizer solution 2.5 mg  2.5 mg  Nebulization Q2H PRN Bonnielee Haff, MD      . ciprofloxacin (CIPRO) IVPB 400 mg  400 mg Intravenous Q12H Bonnielee Haff, MD   400 mg at 09/12/13 0033  . metroNIDAZOLE (FLAGYL) IVPB 500 mg  500 mg Intravenous Q8H Robbie Lis, MD      . ondansetron [facility]) tablet 4 mg  4 mg Oral Q6H PRN Bonnielee Haff, MD       Or  . ondansetron (ZOFRAN) injection 4 mg  4 mg Intravenous Q6H PRN Bonnielee Haff, MD      . Derrill Memo ON 09/13/2013] pantoprazole (PROTONIX) EC tablet 40 mg  40 mg Oral QAC breakfast Danie Binder, MD      . sodium chloride 0.9 % injection 3 mL  3 mL Intravenous Q12H Bonnielee Haff, MD        Allergies as of 09/11/2013  . (No Known Allergies)    Family History  Problem Relation Age of Onset  . Heart disease Mother   . Colon cancer Neg Hx   . Liver disease Neg Hx     History   Social History  . Marital Status: Single    Spouse Name: N/A    Number of Children: 1  . Years of Education: N/A   Occupational History  . unemployed    Social History Main Topics  . Smoking status: Former Smoker -- 0.25 packs/day for 20 years    Types: Cigarettes  . Smokeless tobacco: Never Used  . Alcohol Use: No  . Drug Use:  No  . Sexual Activity: Yes    Partners: Male    Birth Control/ Protection: None   Other Topics Concern  . Not on file   Social History Narrative  . No narrative on file    Review of Systems: Gen: see HPI CV: Denies chest pain, heart palpitations, syncope, edema  Resp: Denies shortness of breath with rest, cough, wheezing GI: see HPI GU : Denies urinary burning, urinary frequency, urinary incontinence.  MS: ankle discomfort  Derm: Denies rash, itching, dry skin Psych: Denies depression, anxiety,confusion, or memory loss Heme: Denies bruising, bleeding, and enlarged lymph nodes.  Physical Exam: Vital signs in last 24 hours: Temp:  [98 F (36.7 C)-98.6 F (37 C)] 98 F (36.7 C) (02/03 0521) Pulse Rate:  [108-122] 109 (02/03 0939) Resp:  [16-20] 18  (02/03 0939) BP: (91-113)/(52-59) 91/56 mmHg (02/03 0521) SpO2:  [95 %-100 %] 98 % (02/03 0939) Weight:  [177 lb 11.2 oz (80.604 kg)-200 lb (90.719 kg)] 177 lb 11.2 oz (80.604 kg) (02/02 2331) Last BM Date: 09/12/13 General:   Alert and oriented X 3, appears older than stated age, mild jaundice Head:  Normocephalic and atraumatic. Eyes:  Sclera clear, mild icterus Ears:  Normal auditory acuity. Nose:  No deformity, discharge,  or lesions. Mouth:  No deformity or lesions, dentition normal. Neck:  Supple; no masses or thyromegaly. Lungs:  Clear throughout to auscultation.   No wheezes, crackles, or rhonchi. No acute distress. Heart:  S1 S2 present with slight tachycardia; no murmurs, clicks, rubs,  or gallops. Abdomen:  Obese, full, non-tense. Obvious hepatomegaly felt 3 fingerbreadths below right costal margin. Non-tender.  Rectal:  Deferred  Msk:  Symmetrical without gross deformities. Normal posture. Extremities:  Trace pretibial edema.  Neurologic:  Alert and  oriented x4;  grossly normal neurologically. Skin:  Intact without significant lesions or rashes. Cervical Nodes:  No significant cervical adenopathy. Psych:  Alert and cooperative. Normal mood and affect.  Intake/Output from previous day:   Intake/Output this shift:    Lab Results:  Recent Labs  09/11/13 1725 09/12/13 0626  WBC 17.4* 13.1*  HGB 10.3* 7.8*  HCT 30.5* 23.0*  PLT 345 244   BMET  Recent Labs  09/11/13 1725 09/12/13 0626  NA 144 140  K 2.6* 3.0*  CL 96 102  CO2 29 23  GLUCOSE 118* 89  BUN 35* 30*  CREATININE 1.08 0.89  CALCIUM 9.6 7.8*   LFT  Recent Labs  09/11/13 1725 09/12/13 0626  PROT 7.7 5.8*  ALBUMIN 3.0* 2.3*  AST 104* 89*  ALT 34 27  ALKPHOS 168* 127*  BILITOT 5.2* 3.8*  BILIDIR  --  2.7*   PT/INR  Recent Labs  09/11/13 2215 09/12/13 0626  LABPROT 15.5* 15.7*  INR 1.26 1.28    Studies/Results: Ct Abdomen Pelvis W Contrast  09/11/2013   CLINICAL DATA:   Diarrhea.  Leg weakness.  Gastrointestinal bleeding.  EXAM: CT ABDOMEN AND PELVIS WITH CONTRAST  TECHNIQUE: Multidetector CT imaging of the abdomen and pelvis was performed using the standard protocol following bolus administration of intravenous contrast.  CONTRAST:  69mL OMNIPAQUE IOHEXOL 300 MG/ML SOLN, 170mL OMNIPAQUE IOHEXOL 300 MG/ML SOLN  COMPARISON:  None.  FINDINGS: Lung bases are clear.  No pleural or pericardial fluid.  There is a large amount of ascites distributed freely throughout the peritoneal space. No loculated components. The liver is markedly enlarged and extremely fatty. Certain areas of the liver show extreme fatty change, particularly the ventral aspect and  the caudate lobe. No suspicion of mass lesion. No calcified gallstones. No sign of ductal dilatation. The spleen is normal. Hepatic vein and portal veins are patent. The pancreas is normal. The adrenal glands are normal. The kidneys are normal. The aorta shows atherosclerosis but no aneurysm. The IVC is normal. No retroperitoneal mass or adenopathy. No free intraperitoneal air. No definite focal bowel lesion. The colon may show slight wall thickening diffusely which could be seen with colitis or with low systemic protein. Uterus and adnexal regions appear normal. The appendix is normal. No bony abnormality.  IMPRESSION: Ascites diffusely distributed.  Profound hepatomegaly and fatty change of the liver.  Possible wall thickening of the colon, which could be seen with colitis or with low protein.  Considerations include primary steatosis, toxin exposure, and viral hepatitis.   Electronically Signed   By: Nelson Chimes M.D.   On: 09/11/2013 21:18   Dg Abd Acute W/chest  09/11/2013   CLINICAL DATA:  Abdominal pain. Diarrhea. Decreased urine output. Weakness.  EXAM: ACUTE ABDOMEN SERIES (ABDOMEN 2 VIEW & CHEST 1 VIEW)  COMPARISON:  None.  FINDINGS: There is no evidence of dilated bowel loops or free intraperitoneal air. No radiopaque calculi  or other significant radiographic abnormality is seen. Heart size and mediastinal contours are within normal limits. Both lungs are clear.  IMPRESSION: Negative abdominal radiographs.  No acute cardiopulmonary disease.   Electronically Signed   By: Earle Gell M.D.   On: 09/11/2013 18:38    Impression: 38 year old female presenting with painless jaundice of unknown etiology; also noted loose stools after significant bout with constipation. CT without ductal dilation and possible colitis. However, more likely to be secondary to hypoalbuminemia; stool studies ordered. LFTs improving from admission, viral markers pending, drug screen pending. No use of illicit drugs, alcohol abuse, OTC agents. Significant hepatomegaly on exam. Also notable, drop in Hgb but no overt signs of GI bleeding. Likely multifactorial.   She is without tense ascites on exam; [place] of abdomen ordered. If fluid available for paracentesis, recommend fluid analysis. Further recommendations as database expands.   Plan: Await pending labs H/H in am LFTs in am Follow-up pending [place] of abdomen; fluid analysis if appropriate Obtain stool samples Empiric abx for now for possible colitis Agree with urine drug screen, viral markers, iron studies PPI for GI prophylaxis   Orvil Feil, ANP-BC [facility]     LOS: 1 day    09/12/2013, 9:46 AM

## 2013-09-12 NOTE — Consult Note (Addendum)
REVIEWED. IF ACUTE HEP PANEL NEG, PT NEEDS SEROLOGY FOR PBC, AUTOIMMUNE HEPATITIS, AND WILSON'S DISEASE. RESTRICT fluid and Na. Add Lasix and Aldactone FEB 4. NUTRITION CONSULT. CHANGE IV ABX TO PO.

## 2013-09-12 NOTE — Progress Notes (Signed)
Utilization Review Complete  

## 2013-09-13 ENCOUNTER — Telehealth: Payer: Self-pay | Admitting: Gastroenterology

## 2013-09-13 LAB — COMPREHENSIVE METABOLIC PANEL
ALT: 30 U/L (ref 0–35)
AST: 105 U/L — ABNORMAL HIGH (ref 0–37)
Albumin: 2.5 g/dL — ABNORMAL LOW (ref 3.5–5.2)
Alkaline Phosphatase: 136 U/L — ABNORMAL HIGH (ref 39–117)
BILIRUBIN TOTAL: 3.4 mg/dL — AB (ref 0.3–1.2)
BUN: 18 mg/dL (ref 6–23)
CHLORIDE: 104 meq/L (ref 96–112)
CO2: 20 meq/L (ref 19–32)
Calcium: 7.9 mg/dL — ABNORMAL LOW (ref 8.4–10.5)
Creatinine, Ser: 0.7 mg/dL (ref 0.50–1.10)
GFR calc Af Amer: >90 mL/min (ref >90)
Glucose, Bld: 120 mg/dL — ABNORMAL HIGH (ref 70–99)
POTASSIUM: 3.2 meq/L — AB (ref 3.7–5.3)
SODIUM: 139 meq/L (ref 137–147)
Total Protein: 6.4 g/dL (ref 6.0–8.3)

## 2013-09-13 LAB — GI PATHOGEN PANEL BY PCR, STOOL
C difficile toxin A/B: NEGATIVE
CRYPTOSPORIDIUM BY PCR: NEGATIVE
Campylobacter by PCR: NEGATIVE
E coli (ETEC) LT/ST: NEGATIVE
E coli (STEC): NEGATIVE
E coli 0157 by PCR: NEGATIVE
G LAMBLIA BY PCR: NEGATIVE
NOROVIRUS G1/G2: NEGATIVE
Rotavirus A by PCR: NEGATIVE
Salmonella by PCR: NEGATIVE
Shigella by PCR: NEGATIVE

## 2013-09-13 LAB — URINE CULTURE: Colony Count: 30000

## 2013-09-13 LAB — CBC
HCT: 23.3 % — ABNORMAL LOW (ref 36.0–46.0)
Hemoglobin: 7.9 g/dL — ABNORMAL LOW (ref 12.0–15.0)
MCH: 40.5 pg — ABNORMAL HIGH (ref 26.0–34.0)
MCHC: 33.9 g/dL (ref 30.0–36.0)
MCV: 119.5 fL — ABNORMAL HIGH (ref 78.0–100.0)
Platelets: 255 10*3/uL (ref 150–400)
RBC: 1.95 MIL/uL — ABNORMAL LOW (ref 3.87–5.11)
RDW: 18.8 % — ABNORMAL HIGH (ref 11.5–15.5)
WBC: 12.3 10*3/uL — ABNORMAL HIGH (ref 4.0–10.5)

## 2013-09-13 LAB — TYPE AND SCREEN
ABO/RH(D): A POS
Antibody Screen: NEGATIVE

## 2013-09-13 MED ORDER — PANTOPRAZOLE SODIUM 40 MG PO TBEC: 40.0000 mg | DELAYED_RELEASE_TABLET | Freq: Every day | ORAL | Status: DC

## 2013-09-13 MED ORDER — FUROSEMIDE 40 MG PO TABS: 40.0000 mg | ORAL_TABLET | Freq: Every day | ORAL | Status: DC

## 2013-09-13 MED ORDER — FOLIC ACID 1 MG PO TABS: 1.0000 mg | ORAL_TABLET | Freq: Every day | ORAL | Status: DC

## 2013-09-13 MED ORDER — CIPROFLOXACIN HCL 500 MG PO TABS: 500.0000 mg | ORAL_TABLET | Freq: Two times a day (BID) | ORAL | Status: DC

## 2013-09-13 MED ORDER — POTASSIUM CHLORIDE CRYS ER 20 MEQ PO TBCR
40.0000 meq | EXTENDED_RELEASE_TABLET | Freq: Once | ORAL | Status: AC
  Administered 2013-09-13: 40 meq via ORAL
  Filled 2013-09-13: qty 2

## 2013-09-13 MED ORDER — SPIRONOLACTONE 100 MG PO TABS: 100.0000 mg | ORAL_TABLET | Freq: Every day | ORAL | Status: DC

## 2013-09-13 MED ORDER — METRONIDAZOLE 500 MG PO TABS: 500.0000 mg | ORAL_TABLET | Freq: Three times a day (TID) | ORAL | Status: DC

## 2013-09-13 NOTE — Progress Notes (Signed)
Nutrition Brief Note  Received MD consult for diet education on low sodium diet.  Attempted education x 2, however, pt was unavailable at times of visits. Noted that pt is being discharged today per her request with close outpatient follow-up.   Paiton Boultinghouse A. Jimmye Norman, RD, LDN Pager: [phone]

## 2013-09-13 NOTE — Progress Notes (Addendum)
Subjective: Continues with loose stool. No rectal bleeding. No abdominal pain, N/V. Tolerating diet. Wants to go home.   Objective: Vital signs in last 24 hours: Temp:  [98.1 F (36.7 C)-98.4 F (36.9 C)] 98.4 F (36.9 C) (02/04 0440) Pulse Rate:  [109-110] 110 (02/04 0440) Resp:  [18] 18 (02/04 0440) BP: (101-113)/(63-64) 103/64 mmHg (02/04 0440) SpO2:  [95 %-100 %] 95 % (02/04 0440) Last BM Date: 09/12/13 General:   Alert and oriented, pleasant, mildly jaundiced Head:  Normocephalic and atraumatic. Eyes:  +scleral icterus Heart:  S1, S2 present, no murmurs noted.  Lungs: Clear to auscultation bilaterally, without wheezing, rales, or rhonchi.  Abdomen:  Bowel sounds present, soft, full, obese. Obvious hepatomegaly on exam. No TTP.  Extremities:  2+ lower extremity edema Neurologic:  Alert and  oriented x4;  grossly normal neurologically.  Intake/Output from previous day: 02/03 0701 - 02/04 0700 In: 511.7 [I.V.:511.7] Out: -  Intake/Output this shift:    Lab Results:  Recent Labs  09/11/13 1725 09/12/13 0626 09/13/13 0848  WBC 17.4* 13.1* 12.3*  HGB 10.3* 7.8* 7.9*  HCT 30.5* 23.0* 23.3*  PLT 345 244 255   BMET  Recent Labs  09/11/13 1725 09/12/13 0626  NA 144 140  K 2.6* 3.0*  CL 96 102  CO2 29 23  GLUCOSE 118* 89  BUN 35* 30*  CREATININE 1.08 0.89  CALCIUM 9.6 7.8*   LFT  Recent Labs  09/11/13 1725 09/12/13 0626  PROT 7.7 5.8*  ALBUMIN 3.0* 2.3*  AST 104* 89*  ALT 34 27  ALKPHOS 168* 127*  BILITOT 5.2* 3.8*  BILIDIR  --  2.7*   PT/INR  Recent Labs  09/11/13 2215 09/12/13 0626  LABPROT 15.5* 15.7*  INR 1.26 1.28   Hepatitis Panel  Recent Labs  09/12/13 0626  HEPBSAG NEGATIVE  HCVAB NEGATIVE  HEPAIGM NON REACTIVE  HEPBIGM NON REACTIVE     Studies/Results: US Abdomen Complete  09/12/2013   CLINICAL DATA:  Jaundice  EXAM: ULTRASOUND ABDOMEN COMPLETE  COMPARISON:  CT abdomen and pelvis 09/11/2013  FINDINGS: Gallbladder:   Sludge within gallbladder. No gallbladder wall thickening or sonographic Murphy sign.  Common bile duct:  Diameter: 3 mm diameter, normal  Liver:  Echogenic, which can be seen with fatty infiltration and cirrhosis is well as certain infiltrative disorders. A few images suggests minimal nodularity of the hepatic surface. No discrete hepatic mass identified. No intrahepatic biliary dilatation. Diminished blood flow is identified in the portal vein.  IVC:  No abnormality visualized.  Pancreas:  Limited due to hepatic enlargement and increased hepatic echogenicity. Small portion of the pancreatic body and proximal tail is normal appearance, remainder obscured.  Spleen:  Normal appearance, 10.5 cm length  Right Kidney:  Length: 11.7 cm.  Normal morphology without mass or hydronephrosis.  Left Kidney:  Length: 11.2 cm.  Normal morphology without mass or hydronephrosis.  Abdominal aorta:  Inadequately visualized for assessment due to combination of enlarged echogenic liver and Central bowel loops due to ascites.  Other findings:  Free intraperitoneal fluid consistent with ascites.  IMPRESSION: Enlarged echogenic liver which can be seen with fatty infiltration and cirrhosis as well as certain infiltrative disorders.  Question minimal nodularity of the hepatic surface on a few images, unable to exclude cirrhosis.  Ascites.  Question diminished flow in the portal vein, see visceral Doppler ultrasound report.   Electronically Signed   By: Lavonia Dana M.D.   On: 09/12/2013 14:55   Ct Abdomen Pelvis W  Contrast  09/11/2013   CLINICAL DATA:  Diarrhea.  Leg weakness.  Gastrointestinal bleeding.  EXAM: CT ABDOMEN AND PELVIS WITH CONTRAST  TECHNIQUE: Multidetector CT imaging of the abdomen and pelvis was performed using the standard protocol following bolus administration of intravenous contrast.  CONTRAST:  32mL OMNIPAQUE IOHEXOL 300 MG/ML SOLN, 134mL OMNIPAQUE IOHEXOL 300 MG/ML SOLN  COMPARISON:  None.  FINDINGS: Lung bases are  clear.  No pleural or pericardial fluid.  There is a large amount of ascites distributed freely throughout the peritoneal space. No loculated components. The liver is markedly enlarged and extremely fatty. Certain areas of the liver show extreme fatty change, particularly the ventral aspect and the caudate lobe. No suspicion of mass lesion. No calcified gallstones. No sign of ductal dilatation. The spleen is normal. Hepatic vein and portal veins are patent. The pancreas is normal. The adrenal glands are normal. The kidneys are normal. The aorta shows atherosclerosis but no aneurysm. The IVC is normal. No retroperitoneal mass or adenopathy. No free intraperitoneal air. No definite focal bowel lesion. The colon may show slight wall thickening diffusely which could be seen with colitis or with low systemic protein. Uterus and adnexal regions appear normal. The appendix is normal. No bony abnormality.  IMPRESSION: Ascites diffusely distributed.  Profound hepatomegaly and fatty change of the liver.  Possible wall thickening of the colon, which could be seen with colitis or with low protein.  Considerations include primary steatosis, toxin exposure, and viral hepatitis.   Electronically Signed   By: Nelson Chimes M.D.   On: 09/11/2013 21:18   [place] Art/ven Flow Abd Pelv Doppler Limited  09/12/2013   CLINICAL DATA:  Jaundice, hepatomegaly, ascites  EXAM: DUPLEX ULTRASOUND OF LIVER LIMITED  TECHNIQUE: Color and duplex Doppler ultrasound was performed to evaluate the hepatic in-flow and out-flow vessels.  COMPARISON:  Ultrasound abdomen 09/12/2013, CT abdomen and pelvis 09/11/2013  FINDINGS: Portal Vein: Suboptimally visualized due to increased hepatic echogenicity. Main portal vein appears somewhat narrow and compressed question secondary to diffuse hepatic edema as identified on prior CT. Blood flow is present within the portal vein on color Doppler imaging, hepatopetal in direction. Small amount of periportal edema is also  noted.  Hepatic veins: Grossly patent, also suboptimally evaluated due to poor sound transmission through echogenic hepatic parenchyma.  Hepatic artery: Patent, with elevated velocity of 382 cm/sec distally at porta hepatis.  Varices: No upper abdominal varices identified.  Ascites: Mild ascites present in upper abdomen.  Markedly increased hepatic echogenicity noted.  IMPRESSION: Right upper quadrant ascites.  Hepatic and portal veins appear patent though somewhat narrow, suspect mildly compressed by edematous hepatic parenchyma.  Hepatic and portal veins were patent on prior CT of 09/11/2013 as well.   Electronically Signed   By: Lavonia Dana M.D.   On: 09/12/2013 15:56   US Paracentesis  09/12/2013   CLINICAL DATA:  New onset ascites, jaundice  EXAM: ULTRASOUND GUIDED DIAGNOSTIC AND THERAPEUTIC PARACENTESIS  COMPARISON:  CT abdomen and pelvis 09/11/2013.  PROCEDURE: Procedure, benefits, and risks of procedure were discussed with patient.  Written informed consent for procedure was obtained.  Time out protocol followed.  Adequate collection of ascites localized in left lower quadrant.  Skin prepped and draped in usual sterile fashion.  Skin and soft tissues anesthetized with 9 mL of 1% lidocaine.  5 [place] Yueh catheter placed into peritoneal cavity.  1180 mL of yellow fluid aspirated by vacuum bottle suction.  Procedure tolerated well by patient without immediate complication.  FINDINGS: A total of approximately 1180 mL of yellow fluid was removed. A fluid sample of 180 mL was sent for laboratory analysis.  IMPRESSION: Successful ultrasound guided paracentesis yielding 1180 mL of ascites.   Electronically Signed   By: Lavonia Dana M.D.   On: 09/12/2013 17:38   Dg Abd Acute W/chest  09/11/2013   CLINICAL DATA:  Abdominal pain. Diarrhea. Decreased urine output. Weakness.  EXAM: ACUTE ABDOMEN SERIES (ABDOMEN 2 VIEW & CHEST 1 VIEW)  COMPARISON:  None.  FINDINGS: There is no evidence of dilated bowel loops or free  intraperitoneal air. No radiopaque calculi or other significant radiographic abnormality is seen. Heart size and mediastinal contours are within normal limits. Both lungs are clear.  IMPRESSION: Negative abdominal radiographs.  No acute cardiopulmonary disease.   Electronically Signed   By: Earle Gell M.D.   On: 09/11/2013 18:38    Assessment: 38 year old admitted with painless jaundice, ascites, loose stool and possible colitis on CT. Negative viral markers; paracentesis yesterday with close to 1.2 liters removed. Cytology and smear review pending but thus far no significant findings. SAAG score (serum-ascites albumin gradient) is 1.6, indicating likely portal hypertension. Significant hepatomegaly on exam and radiological studies, no known hx of liver disease or family history of liver disease.   Needs further evaluation with serologies for PBC, autoimmune, Wilson's disease. Iron studies on file already. May ultimately need liver biopsy if unclear etiology.   Persistent anemia noted without overt signs of GI bleeding; likely multifactorial. Recommend  1 uPRBCs if Hgb remains less than 8.   1 loose stool documented in past 24 hours; unclear if this is accurate. Continue empiric abx. Cdiff PCR negative, GI pathogen panel still pending.   Clinically, patient is stable without worsening of presentation. She is desiring discharge, which is acceptable with close outpatient follow-up.   Plan: Lasix 40 mg and Aldactone 100 mg daily 2 gram Na diet PPI daily Cipro and Flagyl orally Further serologies as described above Follow-up on pending fluid analysis Recommend 1 uPRBCs Possible liver biopsy as outpatient if clinical picture remains unclear Hopeful discharge later today with close outpatient follow-up; our office to arrange   Wanda [name], ANP-BC [facility]    LOS: 2 days    09/13/2013, 9:33 AM    Spoke with Dr. Algis Liming, [facility] hospitalist. Will hold on transfusion.  Patient asymptomatic. I have arranged for her to be seen with me on Feb 11th at 11:30. Repeat labs to include CBC, CMP at that time. Will follow-up pending labs as they come available. Complete 7 days of abx. Close follow-up as outpatient.

## 2013-09-13 NOTE — Progress Notes (Signed)
Patient with orders to be discharge home. Discharge instructions given, patient verbalized understanding. Prescriptions given. Patient stable. Patient left with family in private vehicle.  

## 2013-09-13 NOTE — Telephone Encounter (Signed)
I went ahead and mailed appt card to patient as well.

## 2013-09-13 NOTE — Progress Notes (Signed)
Patient voiced concerns and anxiety about being in hospital.  Patient states she has many personal issues at this time, and would like to be discharged.  Patient is open to seeing medical doctors outpatient.

## 2013-09-13 NOTE — Discharge Summary (Signed)
Physician Discharge Summary  Wanda [name] ZOX:[id number] DOB: [birth year] DOA: 09/11/2013  PCP: No primary provider on file.  Admit date: 09/11/2013 Discharge date: 09/13/2013  Time spent: Greater than 30 minutes  Recommendations for Outpatient Follow-up:  1. Ms. Wanda [name], ANP-BC/GI on 09/20/2013 at 11:30AM with repeat labs (CMP & CBC) 2. PCP in 2 weeks 3. Followup GI pathogen panel PCR 4. Followup autoimmune workup that has been sent from the hospital (ANA, anti-smooth muscle antibody, IgG, IgA, IgM, ceruloplasmin) 5. Followup ascitic fluid cytology report.  Discharge Diagnoses:  Principal Problem:   Jaundice Active Problems:   Colitis   Ascites   Dehydration   Hypokalemia   Leukocytosis, unspecified   Acute blood loss anemia   Discharge Condition: Improved & Stable  Diet recommendation: Heart Healthy/low sodium diet.  Filed Weights   09/11/13 1559 09/11/13 2331  Weight: 90.719 kg (200 lb) 80.604 kg (177 lb 11.2 oz)    History of present illness:  38 year old female patient with no significant past medical history, was admitted on 09/11/13 with 2 week history of constipation followed by decreased oral intake, cough and cold-like symptoms and then diarrhea. She denied recent antibiotic use. She complained of feeling weak and dizzy but denied syncopal episodes. She did not notice yellowish discoloration of her eyes. She denied rectal bleeding, melena, or hematemesis. In the ED, she was found to be jaundiced, total bilirubin 5.2, AST 104, ALT normal and CT abdomen showed diffuse ascites, profound hepatomegaly and fatty liver with possible wall thickening of colon. Denies alcohol use, IV drug abuse or tattoos. Denies Tylenol use. No history of over-the-counter herbal medications use either. Apparently vague historian.  Hospital Course:   Painless jaundice - Unclear etiology. CT abdomen without to dietitian and possible colitis. However her colonic findings are probably more likely  secondary to hypoalbuminemia. LFTs improving from admission. Acute hepatitis screen and HIV antibodies negative.  - GI was consulted. Ultrasound abdomen was done and patient underwent diagnostic and therapeutic paracentesis of approximately 1.2 L on 09/12/13. Cytology and smear review pending but thus far no significant findings. SAAG score is 1.6 indicating likely portal hypertension. Significant hepatomegaly on exam and radiological studies but no history of liver disease or family history of liver disease.  - As per GI, she needs further evaluation with serologies for PBC, autoimmune, Wilson's disease. Iron studies are as below. She may ultimately need liver biopsy if unclear etiology. - GI has started patient on Lasix and Aldactone for ascites, recommend low sodium diet, light daily, followup pending ascitic fluid analysis. Patient is insistent on going home and appears stable. Discussed with Ms. Wanda [name] who agrees and has cleared her for discharge home and has arranged close outpatient followup with her in a week's time with repeat labs.  Ascites - Management as above. Continue diuretics.  Possible infectious colitis/diarrhea - Infectious workup thus far negative. As recommended by GI, complete total one week of Cipro and Flagyl. C. difficile PCR: Negative  Anemia/folate deficiency - Likely chronic chronic disease. Stable. No overt bleeding except menstrual spotting. - Folate supplements. Followup CBC closely as outpatient.  Hypokalemia - Replace prior to discharge and follow closely as outpatient. Started newly on Aldactone.  Dehydration - Resolved after IV fluid hydration.  Leukocytosis - Improving.      Consultations:  Gastroenterology  Procedures:  Paracentesis    Discharge Exam:  Complaints:  Patient anxious and insisting on going home. Denies abdominal pain or distention. No nausea vomiting. Still some diarrhea but improving.  No dizziness or  lightheadedness.  Filed Vitals:   09/12/13 0939 09/12/13 1434 09/12/13 2037 09/13/13 0440  BP:  101/64 113/63 103/64  Pulse: 109 110 110 110  Temp:   98.1 F (36.7 C) 98.4 F (36.9 C)  TempSrc:   Oral Oral  Resp: 18 18 18 18   Height:      Weight:      SpO2: 98% 100% 100% 95%    General exam: Young pleasant female lying comfortably in bed. Scleral icterus. Respiratory system: Clear. No increased work of breathing. Cardiovascular system: S1 & S2 heard, RRR. No JVD, murmurs, gallops, clicks or pedal edema. Telemetry: Sinus tachycardia in the low 100s. Gastrointestinal system: Abdomen is mildly distended, soft and nontender. Normal bowel sounds heard. Central nervous system: Alert and oriented. No focal neurological deficits. Extremities: Symmetric 5 x 5 power.  Discharge Instructions      Discharge Orders   Future Orders Complete By Expires   Call MD for:  extreme fatigue  As directed    Call MD for:  persistant dizziness or light-headedness  As directed    Call MD for:  persistant nausea and vomiting  As directed    Call MD for:  severe uncontrolled pain  As directed    Call MD for:  temperature >100.4  As directed    Call MD for:  As directed    Comments:     Worsening abdominal distention, diarrhea or jaundice.   Diet - low sodium heart healthy  As directed    Increase activity slowly  As directed        Medication List         ciprofloxacin 500 MG tablet  Commonly known as:  CIPRO  Take 1 tablet (500 mg total) by mouth 2 (two) times daily.     folic acid 1 MG tablet  Commonly known as:  FOLVITE  Take 1 tablet (1 mg total) by mouth daily.     furosemide 40 MG tablet  Commonly known as:  LASIX  Take 1 tablet (40 mg total) by mouth daily with breakfast.     metroNIDAZOLE 500 MG tablet  Commonly known as:  FLAGYL  Take 1 tablet (500 mg total) by mouth every 8 (eight) hours.     pantoprazole 40 MG tablet  Commonly known as:  PROTONIX  Take 1 tablet (40 mg  total) by mouth daily before breakfast.     spironolactone 100 MG tablet  Commonly known as:  ALDACTONE  Take 1 tablet (100 mg total) by mouth daily with breakfast.       Follow-up Information   Follow up with Wanda Emperor, NP On 09/20/2013. (at 11:30 AM. To be seen with repeat labs (CMP & CBC))    Specialty:  Gastroenterology   Contact information:   [address] [place] [address] [phone]       Follow up with Primary Medical Doctor. Schedule an appointment as soon as possible for a visit in 2 weeks.       The results of significant diagnostics from this hospitalization (including imaging, microbiology, ancillary and laboratory) are listed below for reference.    Significant Diagnostic Studies: US Abdomen Complete  09/12/2013   CLINICAL DATA:  Jaundice  EXAM: ULTRASOUND ABDOMEN COMPLETE  COMPARISON:  CT abdomen and pelvis 09/11/2013  FINDINGS: Gallbladder:  Sludge within gallbladder. No gallbladder wall thickening or sonographic Murphy sign.  Common bile duct:  Diameter: 3 mm diameter, normal  Liver:  Echogenic, which can  be seen with fatty infiltration and cirrhosis is well as certain infiltrative disorders. A few images suggests minimal nodularity of the hepatic surface. No discrete hepatic mass identified. No intrahepatic biliary dilatation. Diminished blood flow is identified in the portal vein.  IVC:  No abnormality visualized.  Pancreas:  Limited due to hepatic enlargement and increased hepatic echogenicity. Small portion of the pancreatic body and proximal tail is normal appearance, remainder obscured.  Spleen:  Normal appearance, 10.5 cm length  Right Kidney:  Length: 11.7 cm.  Normal morphology without mass or hydronephrosis.  Left Kidney:  Length: 11.2 cm.  Normal morphology without mass or hydronephrosis.  Abdominal aorta:  Inadequately visualized for assessment due to combination of enlarged echogenic liver and Central bowel loops due to ascites.  Other findings:  Free  intraperitoneal fluid consistent with ascites.  IMPRESSION: Enlarged echogenic liver which can be seen with fatty infiltration and cirrhosis as well as certain infiltrative disorders.  Question minimal nodularity of the hepatic surface on a few images, unable to exclude cirrhosis.  Ascites.  Question diminished flow in the portal vein, see visceral Doppler ultrasound report.   Electronically Signed   By: Lavonia Dana M.D.   On: 09/12/2013 14:55   Ct Abdomen Pelvis W Contrast  09/11/2013   CLINICAL DATA:  Diarrhea.  Leg weakness.  Gastrointestinal bleeding.  EXAM: CT ABDOMEN AND PELVIS WITH CONTRAST  TECHNIQUE: Multidetector CT imaging of the abdomen and pelvis was performed using the standard protocol following bolus administration of intravenous contrast.  CONTRAST:  72mL OMNIPAQUE IOHEXOL 300 MG/ML SOLN, 168mL OMNIPAQUE IOHEXOL 300 MG/ML SOLN  COMPARISON:  None.  FINDINGS: Lung bases are clear.  No pleural or pericardial fluid.  There is a large amount of ascites distributed freely throughout the peritoneal space. No loculated components. The liver is markedly enlarged and extremely fatty. Certain areas of the liver show extreme fatty change, particularly the ventral aspect and the caudate lobe. No suspicion of mass lesion. No calcified gallstones. No sign of ductal dilatation. The spleen is normal. Hepatic vein and portal veins are patent. The pancreas is normal. The adrenal glands are normal. The kidneys are normal. The aorta shows atherosclerosis but no aneurysm. The IVC is normal. No retroperitoneal mass or adenopathy. No free intraperitoneal air. No definite focal bowel lesion. The colon may show slight wall thickening diffusely which could be seen with colitis or with low systemic protein. Uterus and adnexal regions appear normal. The appendix is normal. No bony abnormality.  IMPRESSION: Ascites diffusely distributed.  Profound hepatomegaly and fatty change of the liver.  Possible wall thickening of the  colon, which could be seen with colitis or with low protein.  Considerations include primary steatosis, toxin exposure, and viral hepatitis.   Electronically Signed   By: Nelson Chimes M.D.   On: 09/11/2013 21:18   [place] Art/ven Flow Abd Pelv Doppler Limited  09/12/2013   CLINICAL DATA:  Jaundice, hepatomegaly, ascites  EXAM: DUPLEX ULTRASOUND OF LIVER LIMITED  TECHNIQUE: Color and duplex Doppler ultrasound was performed to evaluate the hepatic in-flow and out-flow vessels.  COMPARISON:  Ultrasound abdomen 09/12/2013, CT abdomen and pelvis 09/11/2013  FINDINGS: Portal Vein: Suboptimally visualized due to increased hepatic echogenicity. Main portal vein appears somewhat narrow and compressed question secondary to diffuse hepatic edema as identified on prior CT. Blood flow is present within the portal vein on color Doppler imaging, hepatopetal in direction. Small amount of periportal edema is also noted.  Hepatic veins: Grossly patent, also suboptimally evaluated  due to poor sound transmission through echogenic hepatic parenchyma.  Hepatic artery: Patent, with elevated velocity of 382 cm/sec distally at porta hepatis.  Varices: No upper abdominal varices identified.  Ascites: Mild ascites present in upper abdomen.  Markedly increased hepatic echogenicity noted.  IMPRESSION: Right upper quadrant ascites.  Hepatic and portal veins appear patent though somewhat narrow, suspect mildly compressed by edematous hepatic parenchyma.  Hepatic and portal veins were patent on prior CT of 09/11/2013 as well.   Electronically Signed   By: Lavonia Dana M.D.   On: 09/12/2013 15:56   US Paracentesis  09/12/2013   CLINICAL DATA:  New onset ascites, jaundice  EXAM: ULTRASOUND GUIDED DIAGNOSTIC AND THERAPEUTIC PARACENTESIS  COMPARISON:  CT abdomen and pelvis 09/11/2013.  PROCEDURE: Procedure, benefits, and risks of procedure were discussed with patient.  Written informed consent for procedure was obtained.  Time out protocol followed.   Adequate collection of ascites localized in left lower quadrant.  Skin prepped and draped in usual sterile fashion.  Skin and soft tissues anesthetized with 9 mL of 1% lidocaine.  5 [place] Yueh catheter placed into peritoneal cavity.  1180 mL of yellow fluid aspirated by vacuum bottle suction.  Procedure tolerated well by patient without immediate complication.  FINDINGS: A total of approximately 1180 mL of yellow fluid was removed. A fluid sample of 180 mL was sent for laboratory analysis.  IMPRESSION: Successful ultrasound guided paracentesis yielding 1180 mL of ascites.   Electronically Signed   By: Lavonia Dana M.D.   On: 09/12/2013 17:38   Dg Abd Acute W/chest  09/11/2013   CLINICAL DATA:  Abdominal pain. Diarrhea. Decreased urine output. Weakness.  EXAM: ACUTE ABDOMEN SERIES (ABDOMEN 2 VIEW & CHEST 1 VIEW)  COMPARISON:  None.  FINDINGS: There is no evidence of dilated bowel loops or free intraperitoneal air. No radiopaque calculi or other significant radiographic abnormality is seen. Heart size and mediastinal contours are within normal limits. Both lungs are clear.  IMPRESSION: Negative abdominal radiographs.  No acute cardiopulmonary disease.   Electronically Signed   By: Earle Gell M.D.   On: 09/11/2013 18:38    Microbiology: Recent Results (from the past 240 hour(s))  URINE CULTURE     Status: None   Collection Time    09/11/13  9:45 PM      Result Value Range Status   Specimen Description URINE, CLEAN CATCH   Final   Special Requests NONE   Final   Culture  Setup Time     Final   Value: 09/11/2013 22:30     Performed at [facility]     Final   Value: 30,000 COLONIES/ML     Performed at [facility]   Culture     Final   Value: Multiple bacterial morphotypes present, none predominant. Suggest appropriate recollection if clinically indicated.     Performed at [facility]   Report Status 09/13/2013 FINAL   Final  CLOSTRIDIUM DIFFICILE BY PCR      Status: None   Collection Time    09/12/13 11:55 AM      Result Value Range Status   C difficile by pcr NEGATIVE  NEGATIVE Final  BODY FLUID CULTURE     Status: None   Collection Time    09/12/13  2:15 PM      Result Value Range Status   Specimen Description FLUID ASCITIC   Final   Special Requests NONE   Final  Gram Stain     Final   Value: CYTOSPIN WBC PRESENT,BOTH PMN AND MONONUCLEAR     NO ORGANISMS SEEN     Performed at [facility]     Performed at [facility]   Culture PENDING   Incomplete   Report Status PENDING   Incomplete     Labs: Basic Metabolic Panel:  Recent Labs Lab 09/11/13 1725 09/11/13 2215 09/12/13 0626 09/13/13 0848  NA 144  --  140 139  K 2.6*  --  3.0* 3.2*  CL 96  --  102 104  CO2 29  --  23 20  GLUCOSE 118*  --  89 120*  BUN 35*  --  30* 18  CREATININE 1.08  --  0.89 0.70  CALCIUM 9.6  --  7.8* 7.9*  MG  --  1.6  --   --    Liver Function Tests:  Recent Labs Lab 09/11/13 1725 09/12/13 0626 09/13/13 0848  AST 104* 89* 105*  ALT 34 27 30  ALKPHOS 168* 127* 136*  BILITOT 5.2* 3.8* 3.4*  PROT 7.7 5.8* 6.4  ALBUMIN 3.0* 2.3* 2.5*   No results found for this basename: LIPASE, AMYLASE,  in the last 168 hours  Recent Labs Lab 09/11/13 2215  AMMONIA 46   CBC:  Recent Labs Lab 09/11/13 1725 09/12/13 0626 09/13/13 0848  WBC 17.4* 13.1* 12.3*  NEUTROABS 13.7*  --   --   HGB 10.3* 7.8* 7.9*  HCT 30.5* 23.0* 23.3*  MCV 116.4* 117.3* 119.5*  PLT 345 244 255   Cardiac Enzymes: No results found for this basename: CKTOTAL, CKMB, CKMBINDEX, TROPONINI,  in the last 168 hours BNP: BNP (last 3 results) No results found for this basename: PROBNP,  in the last 8760 hours CBG: No results found for this basename: GLUCAP,  in the last 168 hours  Additional labs: 1. Anemia panel: Iron 112, TIBC not calculated, ferritin 585, folate 1.3, B12: 741. Reticulocytes 49 2. Beta-hCG: <1 3. INR: 1.28 4. Acetaminophen level  <15 5. TSH: 3.713 6. Hepatitis A IgM: Nonreactive, hepatitis B surface antigen: Negative, hepatitis B C. IgM: Nonreactive, HCV antibody: Negative 7. HIV-antibody: Non reactive 8. Ascitic fluid: 50 monocytes-macrophages, 0.7 albumin, 218 WBC, 34 lymphocytes, 1 eosinophil, 15 neutrophils 9. UDS: Negative 10. Blood alcohol level <11   Signed:  Vernell Leep, MD, FACP, FHM. [facility] Pager [phone]  If 7PM-7AM, please contact night-coverage [email or web address] Password [facility] 09/13/2013, 11:47 AM

## 2013-09-13 NOTE — Telephone Encounter (Signed)
Please put patient in with me at 11:30 on Feb 11th. Thanks, Manuela Schwartz! The hospitalist (Dr. Algis Liming) is aware of this appt and will add to d/c summary.

## 2013-09-14 LAB — ANTI-SMOOTH MUSCLE ANTIBODY, IGG: F-Actin IgG: 18 U (ref <20)

## 2013-09-14 LAB — ANA: ANA: NEGATIVE

## 2013-09-16 LAB — IGG, IGA, IGM
IGA: 528 mg/dL — AB (ref 69–380)
IGM, SERUM: 71 mg/dL (ref 52–322)
IgG (Immunoglobin G), Serum: 1260 mg/dL (ref 690–1700)

## 2013-09-16 LAB — BODY FLUID CULTURE: CULTURE: NO GROWTH

## 2013-09-16 LAB — CERULOPLASMIN: Ceruloplasmin: 37 mg/dL (ref 20–60)

## 2013-09-19 LAB — MITOCHONDRIAL ANTIBODIES: Mitochondrial M2 Ab, IgG: 0.42 (ref <0.91)

## 2013-09-20 ENCOUNTER — Telehealth: Payer: Self-pay | Admitting: Gastroenterology

## 2013-09-20 ENCOUNTER — Ambulatory Visit: Payer: Medicare Other | Admitting: Gastroenterology

## 2013-09-20 ENCOUNTER — Encounter: Payer: Self-pay | Admitting: Gastroenterology

## 2013-09-20 NOTE — Telephone Encounter (Signed)
Pt was a no show

## 2013-09-20 NOTE — Telephone Encounter (Signed)
It is imperative patient is seen; if needed, can put in an urgent slot. She was admitted with elevated LFTs, painless jaundice, unknown etiology. Plan was for repeat LFTs today and further investigation of etiology. We need to try and contact her; need updated LFTs stat. Needs appt asap.

## 2013-09-20 NOTE — Telephone Encounter (Signed)
Routing to DS- this is a [facility] pt.

## 2013-09-20 NOTE — Telephone Encounter (Signed)
MAILED LETTER °

## 2013-09-21 ENCOUNTER — Other Ambulatory Visit: Payer: Self-pay

## 2013-09-21 DIAGNOSIS — R945 Abnormal results of liver function studies: Principal | ICD-10-CM

## 2013-09-21 DIAGNOSIS — R7989 Other specified abnormal findings of blood chemistry: Secondary | ICD-10-CM

## 2013-09-21 NOTE — Telephone Encounter (Signed)
I tried to call pt and VM not set up. I called her emergency contact number for [facility] at [phone] and left VM that it was imperative that we get in touch with the pt at once for her to come in.   Lab order has been faxed to [facility] for STAT LFT'S.

## 2013-09-26 NOTE — Progress Notes (Signed)
Quick Note:  Autoimmune, PBC work-up negative. Ceruloplasmin normal.  Let's keep trying to reach patient. She did not show for her follow-up appt. ______

## 2013-09-27 NOTE — Progress Notes (Signed)
Quick Note:  Called. VM not set up. Mailing a letter for her to call. ______

## 2013-09-27 NOTE — Telephone Encounter (Signed)
Tried to call pt. Could not leave a VM. Letter mailed to pt.

## 2013-10-05 ENCOUNTER — Inpatient Hospital Stay (HOSPITAL_COMMUNITY)
Admission: EM | Admit: 2013-10-05 | Discharge: 2013-10-13 | DRG: 683 | Disposition: A | Discharge status: Home / Self Care | Payer: Medicare Other | Attending: Internal Medicine | Admitting: Internal Medicine

## 2013-10-05 ENCOUNTER — Encounter (HOSPITAL_COMMUNITY): Payer: Self-pay | Admitting: Emergency Medicine

## 2013-10-05 DIAGNOSIS — D62 Acute posthemorrhagic anemia: Secondary | ICD-10-CM

## 2013-10-05 DIAGNOSIS — R748 Abnormal levels of other serum enzymes: Secondary | ICD-10-CM

## 2013-10-05 DIAGNOSIS — R531 Weakness: Secondary | ICD-10-CM | POA: Diagnosis present

## 2013-10-05 DIAGNOSIS — R17 Unspecified jaundice: Secondary | ICD-10-CM

## 2013-10-05 DIAGNOSIS — R188 Other ascites: Secondary | ICD-10-CM

## 2013-10-05 DIAGNOSIS — T502X5A Adverse effect of carbonic-anhydrase inhibitors, benzothiadiazides and other diuretics, initial encounter: Secondary | ICD-10-CM | POA: Diagnosis present

## 2013-10-05 DIAGNOSIS — N390 Urinary tract infection, site not specified: Secondary | ICD-10-CM | POA: Diagnosis present

## 2013-10-05 DIAGNOSIS — F411 Generalized anxiety disorder: Secondary | ICD-10-CM | POA: Diagnosis present

## 2013-10-05 DIAGNOSIS — R7402 Elevation of levels of lactic acid dehydrogenase (LDH): Secondary | ICD-10-CM | POA: Diagnosis present

## 2013-10-05 DIAGNOSIS — K529 Noninfective gastroenteritis and colitis, unspecified: Secondary | ICD-10-CM

## 2013-10-05 DIAGNOSIS — R601 Generalized edema: Secondary | ICD-10-CM

## 2013-10-05 DIAGNOSIS — J189 Pneumonia, unspecified organism: Secondary | ICD-10-CM

## 2013-10-05 DIAGNOSIS — D500 Iron deficiency anemia secondary to blood loss (chronic): Secondary | ICD-10-CM | POA: Diagnosis present

## 2013-10-05 DIAGNOSIS — J8409 Other alveolar and parieto-alveolar conditions: Secondary | ICD-10-CM | POA: Diagnosis present

## 2013-10-05 DIAGNOSIS — K219 Gastro-esophageal reflux disease without esophagitis: Secondary | ICD-10-CM | POA: Diagnosis present

## 2013-10-05 DIAGNOSIS — K769 Liver disease, unspecified: Secondary | ICD-10-CM

## 2013-10-05 DIAGNOSIS — K7031 Alcoholic cirrhosis of liver with ascites: Secondary | ICD-10-CM | POA: Diagnosis present

## 2013-10-05 DIAGNOSIS — E873 Alkalosis: Secondary | ICD-10-CM

## 2013-10-05 DIAGNOSIS — I498 Other specified cardiac arrhythmias: Secondary | ICD-10-CM | POA: Diagnosis present

## 2013-10-05 DIAGNOSIS — E8809 Other disorders of plasma-protein metabolism, not elsewhere classified: Secondary | ICD-10-CM | POA: Diagnosis present

## 2013-10-05 DIAGNOSIS — Z8249 Family history of ischemic heart disease and other diseases of the circulatory system: Secondary | ICD-10-CM

## 2013-10-05 DIAGNOSIS — N179 Acute kidney failure, unspecified: Secondary | ICD-10-CM

## 2013-10-05 DIAGNOSIS — R7989 Other specified abnormal findings of blood chemistry: Secondary | ICD-10-CM

## 2013-10-05 DIAGNOSIS — D72829 Elevated white blood cell count, unspecified: Secondary | ICD-10-CM

## 2013-10-05 DIAGNOSIS — R740 Nonspecific elevation of levels of transaminase and lactic acid dehydrogenase [LDH]: Secondary | ICD-10-CM

## 2013-10-05 DIAGNOSIS — D529 Folate deficiency anemia, unspecified: Secondary | ICD-10-CM | POA: Diagnosis present

## 2013-10-05 DIAGNOSIS — K922 Gastrointestinal hemorrhage, unspecified: Secondary | ICD-10-CM | POA: Diagnosis present

## 2013-10-05 DIAGNOSIS — Z87891 Personal history of nicotine dependence: Secondary | ICD-10-CM

## 2013-10-05 DIAGNOSIS — D7589 Other specified diseases of blood and blood-forming organs: Secondary | ICD-10-CM | POA: Diagnosis present

## 2013-10-05 DIAGNOSIS — E860 Dehydration: Secondary | ICD-10-CM

## 2013-10-05 DIAGNOSIS — R7401 Elevation of levels of liver transaminase levels: Secondary | ICD-10-CM

## 2013-10-05 DIAGNOSIS — K7460 Unspecified cirrhosis of liver: Secondary | ICD-10-CM

## 2013-10-05 DIAGNOSIS — E876 Hypokalemia: Secondary | ICD-10-CM

## 2013-10-05 DIAGNOSIS — E538 Deficiency of other specified B group vitamins: Secondary | ICD-10-CM

## 2013-10-05 DIAGNOSIS — R000 Tachycardia, unspecified: Secondary | ICD-10-CM

## 2013-10-05 DIAGNOSIS — D649 Anemia, unspecified: Secondary | ICD-10-CM

## 2013-10-05 DIAGNOSIS — E669 Obesity, unspecified: Secondary | ICD-10-CM | POA: Diagnosis present

## 2013-10-05 DIAGNOSIS — D638 Anemia in other chronic diseases classified elsewhere: Secondary | ICD-10-CM | POA: Diagnosis present

## 2013-10-05 DIAGNOSIS — N059 Unspecified nephritic syndrome with unspecified morphologic changes: Secondary | ICD-10-CM | POA: Diagnosis present

## 2013-10-05 DIAGNOSIS — K5900 Constipation, unspecified: Secondary | ICD-10-CM | POA: Diagnosis present

## 2013-10-05 DIAGNOSIS — D696 Thrombocytopenia, unspecified: Secondary | ICD-10-CM | POA: Diagnosis present

## 2013-10-05 HISTORY — DX: Acute kidney failure, unspecified: N17.9

## 2013-10-05 HISTORY — DX: Generalized edema: R60.1

## 2013-10-05 HISTORY — DX: Deficiency of other specified B group vitamins: E53.8

## 2013-10-05 HISTORY — DX: Unspecified cirrhosis of liver: K74.60

## 2013-10-05 LAB — COMPREHENSIVE METABOLIC PANEL
ALT: 38 U/L — AB (ref 0–35)
AST: 179 U/L — ABNORMAL HIGH (ref 0–37)
Albumin: 2.5 g/dL — ABNORMAL LOW (ref 3.5–5.2)
Alkaline Phosphatase: 283 U/L — ABNORMAL HIGH (ref 39–117)
BUN: 24 mg/dL — ABNORMAL HIGH (ref 6–23)
CALCIUM: 8.6 mg/dL (ref 8.4–10.5)
CO2: 24 mEq/L (ref 19–32)
CREATININE: 3.11 mg/dL — AB (ref 0.50–1.10)
Chloride: 81 mEq/L — ABNORMAL LOW (ref 96–112)
GFR calc Af Amer: 21 mL/min — ABNORMAL LOW (ref >90)
GFR calc non Af Amer: 18 mL/min — ABNORMAL LOW (ref >90)
Glucose, Bld: 102 mg/dL — ABNORMAL HIGH (ref 70–99)
Potassium: 2.6 mEq/L — CL (ref 3.7–5.3)
Sodium: 134 mEq/L — ABNORMAL LOW (ref 137–147)
TOTAL PROTEIN: 7.8 g/dL (ref 6.0–8.3)
Total Bilirubin: 4.5 mg/dL — ABNORMAL HIGH (ref 0.3–1.2)

## 2013-10-05 LAB — CBC WITH DIFFERENTIAL/PLATELET
BASOS ABS: 0 10*3/uL (ref 0.0–0.1)
Basophils Relative: 0 % (ref 0–1)
Eosinophils Absolute: 0.2 10*3/uL (ref 0.0–0.7)
Eosinophils Relative: 1 % (ref 0–5)
HCT: 27.5 % — ABNORMAL LOW (ref 36.0–46.0)
Hemoglobin: 9.1 g/dL — ABNORMAL LOW (ref 12.0–15.0)
LYMPHS ABS: 2.2 10*3/uL (ref 0.7–4.0)
Lymphocytes Relative: 12 % (ref 12–46)
MCH: 38.2 pg — AB (ref 26.0–34.0)
MCHC: 33.1 g/dL (ref 30.0–36.0)
MCV: 115.5 fL — AB (ref 78.0–100.0)
MONO ABS: 1.1 10*3/uL — AB (ref 0.1–1.0)
Monocytes Relative: 6 % (ref 3–12)
Neutro Abs: 14.5 10*3/uL — ABNORMAL HIGH (ref 1.7–7.7)
Neutrophils Relative %: 81 % — ABNORMAL HIGH (ref 43–77)
PLATELETS: 151 10*3/uL (ref 150–400)
RBC: 2.38 MIL/uL — ABNORMAL LOW (ref 3.87–5.11)
RDW: 17.6 % — ABNORMAL HIGH (ref 11.5–15.5)
WBC: 18 10*3/uL — AB (ref 4.0–10.5)

## 2013-10-05 LAB — AMMONIA: AMMONIA: 42 umol/L (ref 11–60)

## 2013-10-05 LAB — LIPASE, BLOOD: Lipase: 179 U/L — ABNORMAL HIGH (ref 11–59)

## 2013-10-05 LAB — I-STAT CG4 LACTIC ACID, ED: Lactic Acid, Venous: 5.72 mmol/L — ABNORMAL HIGH (ref 0.5–2.2)

## 2013-10-05 MED ORDER — SODIUM CHLORIDE 0.9 % IV SOLN
1000.0000 mL | INTRAVENOUS | Status: DC
  Administered 2013-10-06: 1000 mL via INTRAVENOUS

## 2013-10-05 MED ORDER — SODIUM CHLORIDE 0.9 % IV SOLN
1000.0000 mL | Freq: Once | INTRAVENOUS | Status: AC
  Administered 2013-10-05: 1000 mL via INTRAVENOUS

## 2013-10-05 NOTE — ED Provider Notes (Signed)
CSN: [id number]     Arrival date & time 10/05/13  2228 History  This chart was scribed for Delora Fuel, MD by Terressa Koyanagi, ED Scribe. This patient was seen in room APA18/APA18 and the patient's care was started at 11:30pm.   Chief Complaint  Patient presents with  . Abdominal Pain    The history is provided by the patient. No language interpreter was used.    HPI Comments: Wanda [name] is a 38 y.o. Female, with a history of  GERD, who presents to the Emergency Department complaining of bilateral lower leg swelling since December of 2014 and this is a recent flare up. Pt complains of associated weakness and abdominal pain. Pt also complains of associated constipation. She also states that she has had decreased urine output with only 1 void per day. Pt states because of her lack of strength she is unable to turn over when she is laying on her bed, and also unable to walk to the bathroom on her own. Pt denies fever, chills, sweats, nausea, vomiting or diarrhea. Pt denies taking any narcotics, cold medications or over the counter medications to help her symptoms. Pt admits she had a f/u appointment with Internal Medicine regarding her symptoms to which she was a no show.   PCP- None but has been seen at [facility]     Past Medical History  Diagnosis Date  . GERD (gastroesophageal reflux disease)    Past Surgical History  Procedure Laterality Date  . None     Family History  Problem Relation Age of Onset  . Heart disease Mother   . Colon cancer Neg Hx   . Liver disease Neg Hx    History  Substance Use Topics  . Smoking status: Former Smoker -- 0.25 packs/day for 20 years    Types: Cigarettes  . Smokeless tobacco: Never Used  . Alcohol Use: No   OB History   Grav Para Term Preterm Abortions TAB SAB Ect Mult Living   1 1 1       1      Review of Systems  Constitutional: Negative for fever, chills and diaphoresis.  Cardiovascular: Positive for leg swelling.   Gastrointestinal: Positive for abdominal pain and constipation. Negative for nausea, vomiting and diarrhea.  Neurological: Positive for weakness.  All other systems reviewed and are negative.    Allergies  Review of patient's allergies indicates no known allergies.  Home Medications   Current Outpatient Rx  Name  Route  Sig  Dispense  Refill  . ciprofloxacin (CIPRO) 500 MG tablet   Oral   Take 1 tablet (500 mg total) by mouth 2 (two) times daily.   12 tablet   0   . folic acid (FOLVITE) 1 MG tablet   Oral   Take 1 tablet (1 mg total) by mouth daily.   30 tablet   0   . furosemide (LASIX) 40 MG tablet   Oral   Take 1 tablet (40 mg total) by mouth daily with breakfast.   30 tablet   0   . metroNIDAZOLE (FLAGYL) 500 MG tablet   Oral   Take 1 tablet (500 mg total) by mouth every 8 (eight) hours.   18 tablet   0   . pantoprazole (PROTONIX) 40 MG tablet   Oral   Take 1 tablet (40 mg total) by mouth daily before breakfast.   30 tablet   0   . spironolactone (ALDACTONE) 100 MG tablet   Oral  Take 1 tablet (100 mg total) by mouth daily with breakfast.   30 tablet   0    Triage Vitals: BP 119/54  Pulse 123  Temp(Src) 98.3 F (36.8 C) (Oral)  Resp 18  SpO2 96%  LMP 08/28/2013  Physical Exam  Nursing note and vitals reviewed. Constitutional: She is oriented to person, place, and time. She appears well-developed and well-nourished. No distress.  Lethargic but arousable  HENT:  Head: Normocephalic and atraumatic.  Eyes: EOM are normal. Scleral icterus (Mild) is present.  Subconjunctival hemorrhage present on right  Neck: Neck supple. No tracheal deviation present.  Cardiovascular: Normal rate.   Pulmonary/Chest: Effort normal. No respiratory distress.  Abdominal: She exhibits distension (with ascites).  Liver enlarged to 4 cm below the costal margin  Musculoskeletal: Normal range of motion. She exhibits edema (1+).  Neurological: She is alert and oriented  to person, place, and time.  Skin: Skin is warm and dry.    ED Course  Procedures (including critical care time)  DIAGNOSTIC STUDIES: Oxygen Saturation is 96% on RA, normal by my interpretation.    COORDINATION OF CARE: 11:38 PM- Discussed ordering labs, giving IV fluids and placing a catheter to obtain urine sample with pt. Pt advised of plan for treatment and pt agrees.  Labs Review Results for orders placed during the hospital encounter of 10/05/13  CBC WITH DIFFERENTIAL      Result Value Ref Range   WBC 18.0 (*) 4.0 - 10.5 K/uL   RBC 2.38 (*) 3.87 - 5.11 MIL/uL   Hemoglobin 9.1 (*) 12.0 - 15.0 g/dL   HCT 27.5 (*) 36.0 - 46.0 %   MCV 115.5 (*) 78.0 - 100.0 fL   MCH 38.2 (*) 26.0 - 34.0 pg   MCHC 33.1  30.0 - 36.0 g/dL   RDW 17.6 (*) 11.5 - 15.5 %   Platelets 151  150 - 400 K/uL   Neutrophils Relative % 81 (*) 43 - 77 %   Lymphocytes Relative 12  12 - 46 %   Monocytes Relative 6  3 - 12 %   Eosinophils Relative 1  0 - 5 %   Basophils Relative 0  0 - 1 %   Neutro Abs 14.5 (*) 1.7 - 7.7 K/uL   Lymphs Abs 2.2  0.7 - 4.0 K/uL   Monocytes Absolute 1.1 (*) 0.1 - 1.0 K/uL   Eosinophils Absolute 0.2  0.0 - 0.7 K/uL   Basophils Absolute 0.0  0.0 - 0.1 K/uL   RBC Morphology STOMATOCYTES    COMPREHENSIVE METABOLIC PANEL      Result Value Ref Range   Sodium 134 (*) 137 - 147 mEq/L   Potassium 2.6 (*) 3.7 - 5.3 mEq/L   Chloride 81 (*) 96 - 112 mEq/L   CO2 24  19 - 32 mEq/L   Glucose, Bld 102 (*) 70 - 99 mg/dL   BUN 24 (*) 6 - 23 mg/dL   Creatinine, Ser 3.11 (*) 0.50 - 1.10 mg/dL   Calcium 8.6  8.4 - 10.5 mg/dL   Total Protein 7.8  6.0 - 8.3 g/dL   Albumin 2.5 (*) 3.5 - 5.2 g/dL   AST 179 (*) 0 - 37 U/L   ALT 38 (*) 0 - 35 U/L   Alkaline Phosphatase 283 (*) 39 - 117 U/L   Total Bilirubin 4.5 (*) 0.3 - 1.2 mg/dL   GFR calc non Af Amer 18 (*) >90 mL/min   GFR calc Af Amer 21 (*) >90 mL/min  LIPASE, BLOOD      Result Value Ref Range   Lipase 179 (*) 11 - 59 U/L  AMMONIA       Result Value Ref Range   Ammonia 42  11 - 60 umol/L  PROTIME-INR      Result Value Ref Range   Prothrombin Time 15.5 (*) 11.6 - 15.2 seconds   INR 1.26  0.00 - 1.49  I-STAT CG4 LACTIC ACID, ED      Result Value Ref Range   Lactic Acid, Venous 5.72 (*) 0.5 - 2.2 mmol/L   MDM   Final diagnoses:  Acute renal failure  Jaundice  Elevated liver enzymes  Elevated lactic acid level  Elevated lipase  Hypokalemia    Patient is an extremely poor historian but clearly has signs of hepatic failure. Old records are reviewed and she had been admitted for jaundice and cirrhosis and was supposed to followup with gastroenterology but missed the appointment and they had not been able to get in touch with her. Patient relates decreasing urine output and creatinine has come back significantly elevated compared with baseline and the 10 2 catheterize her for urine sample showed an empty bladder. She is tachycardic and IV hydration was started in spite of her obvious total body fluid overload status as evidenced by ascites and peripheral edema. Lactic acid has come back elevated consistent with intravascular volume depletion. Potassium is come back for a low and intravenous potassium is ordered. Liver enzymes including bilirubin are at about the same level that they were with her recent hospitalization. Case is discussed with Dr. [place] Pink [facility] who agrees to admit the patient.   I personally performed the services described in this documentation, which was scribed in my presence. The recorded information has been reviewed and is accurate.     Delora Fuel, MD 17/49/44 [id number]

## 2013-10-05 NOTE — ED Notes (Signed)
Pt was seen here last week for abd pain and bloating, states she has been too sick to even go to her doctor to be seen.

## 2013-10-06 ENCOUNTER — Encounter (HOSPITAL_COMMUNITY): Payer: Self-pay | Admitting: Internal Medicine

## 2013-10-06 DIAGNOSIS — N179 Acute kidney failure, unspecified: Secondary | ICD-10-CM | POA: Diagnosis present

## 2013-10-06 DIAGNOSIS — D72829 Elevated white blood cell count, unspecified: Secondary | ICD-10-CM

## 2013-10-06 DIAGNOSIS — R7989 Other specified abnormal findings of blood chemistry: Secondary | ICD-10-CM

## 2013-10-06 DIAGNOSIS — R188 Other ascites: Secondary | ICD-10-CM

## 2013-10-06 DIAGNOSIS — R7401 Elevation of levels of liver transaminase levels: Secondary | ICD-10-CM | POA: Diagnosis present

## 2013-10-06 DIAGNOSIS — R160 Hepatomegaly, not elsewhere classified: Secondary | ICD-10-CM

## 2013-10-06 DIAGNOSIS — R531 Weakness: Secondary | ICD-10-CM | POA: Diagnosis present

## 2013-10-06 DIAGNOSIS — D649 Anemia, unspecified: Secondary | ICD-10-CM | POA: Diagnosis present

## 2013-10-06 DIAGNOSIS — R000 Tachycardia, unspecified: Secondary | ICD-10-CM | POA: Diagnosis present

## 2013-10-06 DIAGNOSIS — E876 Hypokalemia: Secondary | ICD-10-CM

## 2013-10-06 DIAGNOSIS — R740 Nonspecific elevation of levels of transaminase and lactic acid dehydrogenase [LDH]: Secondary | ICD-10-CM

## 2013-10-06 LAB — LACTATE DEHYDROGENASE: LDH: 367 U/L — ABNORMAL HIGH (ref 94–250)

## 2013-10-06 LAB — COMPREHENSIVE METABOLIC PANEL
ALT: 34 U/L (ref 0–35)
AST: 160 U/L — ABNORMAL HIGH (ref 0–37)
Albumin: 2.2 g/dL — ABNORMAL LOW (ref 3.5–5.2)
Alkaline Phosphatase: 250 U/L — ABNORMAL HIGH (ref 39–117)
BUN: 24 mg/dL — ABNORMAL HIGH (ref 6–23)
CO2: 22 meq/L (ref 19–32)
Calcium: 7.9 mg/dL — ABNORMAL LOW (ref 8.4–10.5)
Chloride: 86 mEq/L — ABNORMAL LOW (ref 96–112)
Creatinine, Ser: 3.1 mg/dL — ABNORMAL HIGH (ref 0.50–1.10)
GFR calc Af Amer: 21 mL/min — ABNORMAL LOW (ref >90)
GFR calc non Af Amer: 18 mL/min — ABNORMAL LOW (ref >90)
GLUCOSE: 81 mg/dL (ref 70–99)
Potassium: 2.7 mEq/L — CL (ref 3.7–5.3)
Sodium: 134 mEq/L — ABNORMAL LOW (ref 137–147)
Total Bilirubin: 4.2 mg/dL — ABNORMAL HIGH (ref 0.3–1.2)
Total Protein: 6.9 g/dL (ref 6.0–8.3)

## 2013-10-06 LAB — PROTIME-INR
INR: 1.26 (ref 0.00–1.49)
PROTHROMBIN TIME: 15.5 s — AB (ref 11.6–15.2)

## 2013-10-06 LAB — RHEUMATOID FACTOR

## 2013-10-06 LAB — MAGNESIUM: Magnesium: 1.5 mg/dL (ref 1.5–2.5)

## 2013-10-06 LAB — DIRECT ANTIGLOBULIN TEST (NOT AT ARMC)
DAT, IgG: NEGATIVE
DAT, complement: NEGATIVE

## 2013-10-06 LAB — HEPATITIS B SURFACE ANTIGEN: HEP B S AG: NEGATIVE

## 2013-10-06 LAB — RETICULOCYTES
RBC.: 2.23 MIL/uL — ABNORMAL LOW (ref 3.87–5.11)
Retic Count, Absolute: 62.4 10*3/uL (ref 19.0–186.0)
Retic Ct Pct: 2.8 % (ref 0.4–3.1)

## 2013-10-06 LAB — HAPTOGLOBIN: Haptoglobin: 218 mg/dL — ABNORMAL HIGH (ref 45–215)

## 2013-10-06 LAB — CBC
HCT: 24.7 % — ABNORMAL LOW (ref 36.0–46.0)
HEMOGLOBIN: 8.2 g/dL — AB (ref 12.0–15.0)
MCH: 38.7 pg — ABNORMAL HIGH (ref 26.0–34.0)
MCHC: 33.2 g/dL (ref 30.0–36.0)
MCV: 116.5 fL — ABNORMAL HIGH (ref 78.0–100.0)
Platelets: 114 10*3/uL — ABNORMAL LOW (ref 150–400)
RBC: 2.12 MIL/uL — AB (ref 3.87–5.11)
RDW: 17.7 % — ABNORMAL HIGH (ref 11.5–15.5)
WBC: 15.5 10*3/uL — ABNORMAL HIGH (ref 4.0–10.5)

## 2013-10-06 LAB — HEPATITIS C ANTIBODY: HCV Ab: NEGATIVE

## 2013-10-06 LAB — PHOSPHORUS: PHOSPHORUS: 2.4 mg/dL (ref 2.3–4.6)

## 2013-10-06 LAB — LACTIC ACID, PLASMA: Lactic Acid, Venous: 4.4 mmol/L — ABNORMAL HIGH (ref 0.5–2.2)

## 2013-10-06 MED ORDER — POTASSIUM CHLORIDE CRYS ER 20 MEQ PO TBCR
40.0000 meq | EXTENDED_RELEASE_TABLET | Freq: Two times a day (BID) | ORAL | Status: DC
  Administered 2013-10-06 (×2): 40 meq via ORAL
  Filled 2013-10-06 (×2): qty 2

## 2013-10-06 MED ORDER — SODIUM CHLORIDE 0.9 % IV BOLUS (SEPSIS)
500.0000 mL | Freq: Once | INTRAVENOUS | Status: AC
  Administered 2013-10-06: 500 mL via INTRAVENOUS

## 2013-10-06 MED ORDER — POTASSIUM CHLORIDE CRYS ER 20 MEQ PO TBCR
40.0000 meq | EXTENDED_RELEASE_TABLET | Freq: Once | ORAL | Status: AC
  Administered 2013-10-06: 40 meq via ORAL
  Filled 2013-10-06: qty 2

## 2013-10-06 MED ORDER — SPIRONOLACTONE 25 MG PO TABS
50.0000 mg | ORAL_TABLET | Freq: Every day | ORAL | Status: DC
  Administered 2013-10-06: 50 mg via ORAL
  Filled 2013-10-06: qty 2

## 2013-10-06 MED ORDER — SODIUM CHLORIDE 0.9 % IJ SOLN
3.0000 mL | Freq: Two times a day (BID) | INTRAMUSCULAR | Status: DC
  Administered 2013-10-06 – 2013-10-13 (×14): 3 mL via INTRAVENOUS

## 2013-10-06 MED ORDER — ONDANSETRON HCL 4 MG PO TABS: 4.0000 mg | ORAL_TABLET | Freq: Four times a day (QID) | ORAL | Status: DC | PRN

## 2013-10-06 MED ORDER — SODIUM CHLORIDE 0.9 % IV SOLN
INTRAVENOUS | Status: DC
  Administered 2013-10-06: 02:00:00 via INTRAVENOUS

## 2013-10-06 MED ORDER — POTASSIUM CHLORIDE IN NACL 40-0.9 MEQ/L-% IV SOLN
INTRAVENOUS | Status: DC
  Administered 2013-10-06: 125 mL/h via INTRAVENOUS

## 2013-10-06 MED ORDER — FOLIC ACID 1 MG PO TABS
1.0000 mg | ORAL_TABLET | Freq: Every day | ORAL | Status: DC
  Administered 2013-10-06 – 2013-10-13 (×8): 1 mg via ORAL
  Filled 2013-10-06 (×8): qty 1

## 2013-10-06 MED ORDER — POTASSIUM CHLORIDE IN NACL 40-0.9 MEQ/L-% IV SOLN
INTRAVENOUS | Status: AC
  Administered 2013-10-06: 125 mL/h via INTRAVENOUS

## 2013-10-06 MED ORDER — TRAZODONE HCL 50 MG PO TABS
25.0000 mg | ORAL_TABLET | Freq: Once | ORAL | Status: AC
  Administered 2013-10-06: 25 mg via ORAL
  Filled 2013-10-06: qty 1

## 2013-10-06 MED ORDER — SODIUM CHLORIDE 0.9 % IV SOLN: INTRAVENOUS | Status: AC

## 2013-10-06 MED ORDER — ONDANSETRON HCL 4 MG/2ML IJ SOLN: 4.0000 mg | Freq: Four times a day (QID) | INTRAMUSCULAR | Status: DC | PRN

## 2013-10-06 MED ORDER — POTASSIUM CHLORIDE 10 MEQ/100ML IV SOLN
10.0000 meq | Freq: Once | INTRAVENOUS | Status: AC
  Administered 2013-10-06: 10 meq via INTRAVENOUS
  Filled 2013-10-06: qty 100

## 2013-10-06 MED ORDER — SPIRONOLACTONE 25 MG PO TABS
25.0000 mg | ORAL_TABLET | Freq: Every day | ORAL | Status: DC
  Administered 2013-10-07 – 2013-10-13 (×7): 25 mg via ORAL
  Filled 2013-10-06 (×7): qty 1

## 2013-10-06 MED ORDER — ONDANSETRON HCL 4 MG/2ML IJ SOLN: 4.0000 mg | Freq: Three times a day (TID) | INTRAMUSCULAR | Status: AC | PRN

## 2013-10-06 MED ORDER — PANTOPRAZOLE SODIUM 40 MG PO TBEC
40.0000 mg | DELAYED_RELEASE_TABLET | Freq: Every day | ORAL | Status: DC
  Administered 2013-10-06 – 2013-10-13 (×8): 40 mg via ORAL
  Filled 2013-10-06 (×8): qty 1

## 2013-10-06 MED ORDER — ACETAMINOPHEN 650 MG RE SUPP: 650.0000 mg | Freq: Four times a day (QID) | RECTAL | Status: DC | PRN

## 2013-10-06 MED ORDER — ACETAMINOPHEN 325 MG PO TABS
650.0000 mg | ORAL_TABLET | Freq: Four times a day (QID) | ORAL | Status: DC | PRN
  Administered 2013-10-06: 650 mg via ORAL
  Filled 2013-10-06: qty 2

## 2013-10-06 NOTE — H&P (Signed)
[facility] History and Physical  Wanda [name] [id number] DOB: 12/21/1975 DOA: 10/05/2013   PCP:  None Specialists: Gastroenterology  Chief Complaint: Weakness  HPI: Wanda [name] is a 38 y.o. female with a past medical history of liver disease of unclear etiology with jaundice and ascites. She was recently hospitalized earlier this month and was discharged on February 4. She was discharged on diuretics and antibiotics. She mentioned that she has been taking her medications regularly. She's always had generalized weakness which she attributes to her liver disease. However, the weakness has been worse in the last week or so. Unfortunately, patient is a very poor historian. It was very difficult to get any information from her. She denies any abdominal pain, per se, but does have some discomfort due to the ascites. No fever. No chills. Denies any diarrhea. Has had small quantities of vomiting occasionally. Has noticed a decreased amount of urination over the last week or so.  Home Medications: Prior to Admission medications   Medication Sig Start Date End Date Taking? Authorizing Provider  folic acid (FOLVITE) 1 MG tablet Take 1 tablet (1 mg total) by mouth daily. 09/13/13   Modena Jansky, MD  furosemide (LASIX) 40 MG tablet Take 1 tablet (40 mg total) by mouth daily with breakfast. 09/13/13   Modena Jansky, MD  pantoprazole (PROTONIX) 40 MG tablet Take 1 tablet (40 mg total) by mouth daily before breakfast. 09/13/13   Modena Jansky, MD  spironolactone (ALDACTONE) 100 MG tablet Take 1 tablet (100 mg total) by mouth daily with breakfast. 09/13/13   Modena Jansky, MD    Allergies: No Known Allergies  Past Medical History: Past Medical History  Diagnosis Date  . GERD (gastroesophageal reflux disease)     Past Surgical History  Procedure Laterality Date  . None      Social History:   History   Social History  . Marital Status: Single    Spouse Name: N/A   Number of Children: 1  . Years of Education: N/A   Occupational History  . unemployed    Social History Main Topics  . Smoking status: Former Smoker -- 0.25 packs/day for 20 years    Types: Cigarettes  . Smokeless tobacco: Never Used  . Alcohol Use: No  . Drug Use: No  . Sexual Activity: Yes    Partners: Male    Birth Control/ Protection: None   Other Topics Concern  . Not on file   Social History Narrative  . No narrative on file    Family History:  Family History  Problem Relation Age of Onset  . Heart disease Mother   . Colon cancer Neg Hx   . Liver disease Neg Hx      Review of Systems - difficult to obtain due to her lack of cooperation  Physical Examination  Filed Vitals:   10/05/13 2330 10/05/13 2343 10/06/13 0000 10/06/13 0030  BP: 102/42 103/47 111/51 106/49  Pulse: 116 121 119 120  Temp:  97.8 F (36.6 C)    TempSrc:  Oral    Resp:      SpO2:  97%      BP 106/49  Pulse 120  Temp(Src) 97.8 F (36.6 C) (Oral)  Resp 18  SpO2 97%  LMP 08/28/2013  General appearance: alert, cooperative, appears stated age and no distress Head: Normocephalic, without obvious abnormality, atraumatic Eyes: icteric sclera. Throat: lips, mucosa, and tongue normal; teeth and gums normal Neck: no adenopathy, no  carotid bruit, no JVD, supple, symmetrical, trachea midline and thyroid not enlarged, symmetric, no tenderness/mass/nodules Resp: Decreased air entry at the bases without any crackles or wheezing. Cardio: S1-S2 is tachycardic, regular no S3, S4. No rubs, murmurs, or bruit. Minimal Pedal edema is present GI: Abdomen is distended. Hepatomegaly and splenomegaly is appreciated. Nontender. Ascites is present. Bowel sounds are heard. Extremities: edema Minimal Pulses: 2+ and symmetric Skin: Skin color, texture, turgor normal. No rashes or lesions Lymph nodes: Cervical, supraclavicular, and axillary nodes normal. Neurologic: She is alert. No cranial nerve deficits. No  focal motor strength weakness is appreciated.  Laboratory Data: Results for orders placed during the hospital encounter of 10/05/13 (from the past 48 hour(s))  CBC WITH DIFFERENTIAL     Status: Abnormal   Collection Time    10/05/13 10:47 PM      Result Value Ref Range   WBC 18.0 (*) 4.0 - 10.5 K/uL   RBC 2.38 (*) 3.87 - 5.11 MIL/uL   Hemoglobin 9.1 (*) 12.0 - 15.0 g/dL   HCT 27.5 (*) 36.0 - 46.0 %   MCV 115.5 (*) 78.0 - 100.0 fL   MCH 38.2 (*) 26.0 - 34.0 pg   MCHC 33.1  30.0 - 36.0 g/dL   RDW 17.6 (*) 11.5 - 15.5 %   Platelets 151  150 - 400 K/uL   Neutrophils Relative % 81 (*) 43 - 77 %   Lymphocytes Relative 12  12 - 46 %   Monocytes Relative 6  3 - 12 %   Eosinophils Relative 1  0 - 5 %   Basophils Relative 0  0 - 1 %   Neutro Abs 14.5 (*) 1.7 - 7.7 K/uL   Lymphs Abs 2.2  0.7 - 4.0 K/uL   Monocytes Absolute 1.1 (*) 0.1 - 1.0 K/uL   Eosinophils Absolute 0.2  0.0 - 0.7 K/uL   Basophils Absolute 0.0  0.0 - 0.1 K/uL   RBC Morphology STOMATOCYTES     Comment: TARGET CELLS  COMPREHENSIVE METABOLIC PANEL     Status: Abnormal   Collection Time    10/05/13 10:47 PM      Result Value Ref Range   Sodium 134 (*) 137 - 147 mEq/L   Potassium 2.6 (*) 3.7 - 5.3 mEq/L   Comment: CRITICAL RESULT CALLED TO, READ BACK BY AND VERIFIED WITH:     MOORE S AT 2347 ON [id number] BY FORSYTH K   Chloride 81 (*) 96 - 112 mEq/L   CO2 24  19 - 32 mEq/L   Glucose, Bld 102 (*) 70 - 99 mg/dL   BUN 24 (*) 6 - 23 mg/dL   Creatinine, Ser 3.11 (*) 0.50 - 1.10 mg/dL   Calcium 8.6  8.4 - 10.5 mg/dL   Total Protein 7.8  6.0 - 8.3 g/dL   Albumin 2.5 (*) 3.5 - 5.2 g/dL   AST 179 (*) 0 - 37 U/L   ALT 38 (*) 0 - 35 U/L   Alkaline Phosphatase 283 (*) 39 - 117 U/L   Total Bilirubin 4.5 (*) 0.3 - 1.2 mg/dL   GFR calc non Af Amer 18 (*) >90 mL/min   GFR calc Af Amer 21 (*) >90 mL/min   Comment: (NOTE)     The eGFR has been calculated using the CKD EPI equation.     This calculation has not been validated in all  clinical situations.     eGFR's persistently <90 mL/min signify possible Chronic Kidney  Disease.  LIPASE, BLOOD     Status: Abnormal   Collection Time    10/05/13 10:47 PM      Result Value Ref Range   Lipase 179 (*) 11 - 59 U/L  AMMONIA     Status: None   Collection Time    10/05/13 10:48 PM      Result Value Ref Range   Ammonia 42  11 - 60 umol/L  I-STAT CG4 LACTIC ACID, ED     Status: Abnormal   Collection Time    10/05/13 11:55 PM      Result Value Ref Range   Lactic Acid, Venous 5.72 (*) 0.5 - 2.2 mmol/L  PROTIME-INR     Status: Abnormal   Collection Time    10/06/13 12:00 AM      Result Value Ref Range   Prothrombin Time 15.5 (*) 11.6 - 15.2 seconds   INR 1.26  0.00 - 1.49    Radiology Reports: No results found.  Problem List  Principal Problem:   ARF (acute renal failure) Active Problems:   Jaundice   Ascites   Hypokalemia   Leukocytosis, unspecified   Generalized weakness   Transaminitis   Acute renal failure   Assessment: This is a 38 year old, Caucasian female, who presents with generalized weakness. She's found to be tachycardic and has acute renal failure. Is also hypochloremic. She has elevated lactic acid level. It is quite possible that all these findings are due to recent diuretic use. She could be hypovolemic, although she definitely has significant ascites. She's always had elevated WBC.  Plan: #1 acute renal failure: Could be due to diuretic use. Hepatorenal is a possibility. We will give her IV fluids for now. Repeat renal function in the morning. Due to her tenuous volume status we will consult nephrology to assist with management. Hold her diuretics for now. She may require a renal ultrasound of her renal function does not improve.  #2 unspecified liver disease with ascites and jaundice: Consult gastroenterology to assist with management. She may require liver biopsy. She also has an elevated lipase level. She does have significantly distended  abdomen, but is nontender.  #3 severe hypokalemia: This will be repleted orally and intravenously. Repeat potassium level in the morning.  #4 unspecified leukocytosis: She is afebrile. She has had elevated WBC in the past. Etiology remains unclear. Continue to monitor. No definite signs of infection.  #5 macrocytic anemia: Hemoglobin appears to be stable compared to previous values. Continue to monitor.  #6 sinus tachycardia: Possibly due to volume depletion: Monitor on telemetry. See response to IV fluids. May require further workup if heart rate does not improve. TSH was normal earlier this month.   DVT Prophylaxis: SCDs Code Status: Full code Family Communication: Discussed with the patient  Disposition Plan: Admit to telemetry   Further management decisions will depend on results of further testing and patient's response to treatment.  [facility]  [facility] Pager [phone]  If 7PM-7AM, please contact night-coverage [email or web address] Password [facility]  10/06/2013, 12:52 AM

## 2013-10-06 NOTE — Consult Note (Signed)
Reason for Consult:AKI Referring Physician: [name] is an 38 y.o. female.  HPI: She is a patient with history of Liver disease of unknown etiology came to the hospital with severe weakness. Patient has been here about 3 weeks ago with diagnosis of liver disease and hypokalemia. When she is readmitted patient was found to have elvatted BUN and Creatinine and hypokalemia. Patient denies any previous history of renal failure of kidney stone. No history of NSAID use. Presently denies any nausea or vomiting.   Past Medical History  Diagnosis Date  . GERD (gastroesophageal reflux disease)     Past Surgical History  Procedure Laterality Date  . None      Family History  Problem Relation Age of Onset  . Heart disease Mother   . Colon cancer Neg Hx   . Liver disease Neg Hx     Social History:  reports that she has quit smoking. Her smoking use included Cigarettes. She has a 5 pack-year smoking history. She has never used smokeless tobacco. She reports that she does not drink alcohol or use illicit drugs.  Allergies: No Known Allergies  Medications: I have reviewed the patient's current medications.  Results for orders placed during the hospital encounter of 10/05/13 (from the past 48 hour(s))  CBC WITH DIFFERENTIAL     Status: Abnormal   Collection Time    10/05/13 10:47 PM      Result Value Ref Range   WBC 18.0 (*) 4.0 - 10.5 K/uL   RBC 2.38 (*) 3.87 - 5.11 MIL/uL   Hemoglobin 9.1 (*) 12.0 - 15.0 g/dL   HCT 27.5 (*) 36.0 - 46.0 %   MCV 115.5 (*) 78.0 - 100.0 fL   MCH 38.2 (*) 26.0 - 34.0 pg   MCHC 33.1  30.0 - 36.0 g/dL   RDW 17.6 (*) 11.5 - 15.5 %   Platelets 151  150 - 400 K/uL   Neutrophils Relative % 81 (*) 43 - 77 %   Lymphocytes Relative 12  12 - 46 %   Monocytes Relative 6  3 - 12 %   Eosinophils Relative 1  0 - 5 %   Basophils Relative 0  0 - 1 %   Neutro Abs 14.5 (*) 1.7 - 7.7 K/uL   Lymphs Abs 2.2  0.7 - 4.0 K/uL   Monocytes Absolute 1.1 (*) 0.1 -  1.0 K/uL   Eosinophils Absolute 0.2  0.0 - 0.7 K/uL   Basophils Absolute 0.0  0.0 - 0.1 K/uL   RBC Morphology STOMATOCYTES     Comment: TARGET CELLS  COMPREHENSIVE METABOLIC PANEL     Status: Abnormal   Collection Time    10/05/13 10:47 PM      Result Value Ref Range   Sodium 134 (*) 137 - 147 mEq/L   Potassium 2.6 (*) 3.7 - 5.3 mEq/L   Comment: CRITICAL RESULT CALLED TO, READ BACK BY AND VERIFIED WITH:     MOORE S AT 2347 ON [id number] BY FORSYTH K   Chloride 81 (*) 96 - 112 mEq/L   CO2 24  19 - 32 mEq/L   Glucose, Bld 102 (*) 70 - 99 mg/dL   BUN 24 (*) 6 - 23 mg/dL   Creatinine, Ser 3.11 (*) 0.50 - 1.10 mg/dL   Calcium 8.6  8.4 - 10.5 mg/dL   Total Protein 7.8  6.0 - 8.3 g/dL   Albumin 2.5 (*) 3.5 - 5.2 g/dL   AST 179 (*) 0 -  37 U/L   ALT 38 (*) 0 - 35 U/L   Alkaline Phosphatase 283 (*) 39 - 117 U/L   Total Bilirubin 4.5 (*) 0.3 - 1.2 mg/dL   GFR calc non Af Amer 18 (*) >90 mL/min   GFR calc Af Amer 21 (*) >90 mL/min   Comment: (NOTE)     The eGFR has been calculated using the CKD EPI equation.     This calculation has not been validated in all clinical situations.     eGFR's persistently <90 mL/min signify possible Chronic Kidney     Disease.  LIPASE, BLOOD     Status: Abnormal   Collection Time    10/05/13 10:47 PM      Result Value Ref Range   Lipase 179 (*) 11 - 59 U/L  AMMONIA     Status: None   Collection Time    10/05/13 10:48 PM      Result Value Ref Range   Ammonia 42  11 - 60 umol/L  I-STAT CG4 LACTIC ACID, ED     Status: Abnormal   Collection Time    10/05/13 11:55 PM      Result Value Ref Range   Lactic Acid, Venous 5.72 (*) 0.5 - 2.2 mmol/L  PROTIME-INR     Status: Abnormal   Collection Time    10/06/13 12:00 AM      Result Value Ref Range   Prothrombin Time 15.5 (*) 11.6 - 15.2 seconds   INR 1.26  0.00 - 1.49  COMPREHENSIVE METABOLIC PANEL     Status: Abnormal   Collection Time    10/06/13  4:47 AM      Result Value Ref Range   Sodium 134 (*) 137 -  147 mEq/L   Potassium 2.7 (*) 3.7 - 5.3 mEq/L   Comment: CRITICAL RESULT CALLED TO, READ BACK BY AND VERIFIED WITH:     FRANK,L AT 6:05AM ON 10/06/13 BY FESTERMAN,C   Chloride 86 (*) 96 - 112 mEq/L   CO2 22  19 - 32 mEq/L   Glucose, Bld 81  70 - 99 mg/dL   BUN 24 (*) 6 - 23 mg/dL   Creatinine, Ser 3.10 (*) 0.50 - 1.10 mg/dL   Calcium 7.9 (*) 8.4 - 10.5 mg/dL   Total Protein 6.9  6.0 - 8.3 g/dL   Albumin 2.2 (*) 3.5 - 5.2 g/dL   AST 160 (*) 0 - 37 U/L   ALT 34  0 - 35 U/L   Alkaline Phosphatase 250 (*) 39 - 117 U/L   Total Bilirubin 4.2 (*) 0.3 - 1.2 mg/dL   GFR calc non Af Amer 18 (*) >90 mL/min   GFR calc Af Amer 21 (*) >90 mL/min   Comment: (NOTE)     The eGFR has been calculated using the CKD EPI equation.     This calculation has not been validated in all clinical situations.     eGFR's persistently <90 mL/min signify possible Chronic Kidney     Disease.  CBC     Status: Abnormal   Collection Time    10/06/13  4:47 AM      Result Value Ref Range   WBC 15.5 (*) 4.0 - 10.5 K/uL   RBC 2.12 (*) 3.87 - 5.11 MIL/uL   Hemoglobin 8.2 (*) 12.0 - 15.0 g/dL   HCT 24.7 (*) 36.0 - 46.0 %   MCV 116.5 (*) 78.0 - 100.0 fL   MCH 38.7 (*) 26.0 - 34.0 pg  MCHC 33.2  30.0 - 36.0 g/dL   RDW 17.7 (*) 11.5 - 15.5 %   Platelets 114 (*) 150 - 400 K/uL   Comment: SPECIMEN CHECKED FOR CLOTS     PLATELET COUNT CONFIRMED BY SMEAR  LACTIC ACID, PLASMA     Status: Abnormal   Collection Time    10/06/13  4:47 AM      Result Value Ref Range   Lactic Acid, Venous 4.4 (*) 0.5 - 2.2 mmol/L  MAGNESIUM     Status: None   Collection Time    10/06/13  4:47 AM      Result Value Ref Range   Magnesium 1.5  1.5 - 2.5 mg/dL    No results found.  Review of Systems  Constitutional: Positive for malaise/fatigue.  Respiratory: Negative for shortness of breath.   Cardiovascular: Positive for leg swelling. Negative for orthopnea.  Gastrointestinal: Positive for nausea and abdominal pain. Negative for  vomiting and diarrhea.  Neurological: Positive for weakness.   Blood pressure 91/52, pulse 123, temperature 98.2 F (36.8 C), temperature source Oral, resp. rate 18, height 5' 2"  (1.575 m), weight 80.74 kg (178 lb), last menstrual period 08/28/2013, SpO2 94.00%. Physical Exam  Constitutional: No distress.  Eyes: No scleral icterus.  Neck: No JVD present.  Cardiovascular: Normal rate and regular rhythm.   Respiratory: She has no wheezes. She has no rales.  GI: She exhibits distension. There is no tenderness. There is no rebound and no guarding.  Musculoskeletal: She exhibits edema.  Neurological: She is alert.    Assessment/Plan: Problem #1 AKI: Patient's creatinine was 0.8 on 09/13/2013 and presently has increased signicantly from her base line. Etiology could be ATN vs Prerenal as patient was on diuretics with poor po intake. How ever with boarder line low blood pressure and hyponatrimia posiblity of Hepatorenal syndrome also need to be entertained. Problem#2 : Hypokalemia: Possibly from diuretics Problem #3 Chronic Liver disease Problem#4 Anemia Problem#5 Weakness possibly from her hypokalemia Plan: Check UA, Fractional excretion of sodium and potassium Change her ivf to ns with 40 meq kcl at 75 cc/hr Restart on aldactone 50 mg po once a day  Wanda [name] S 10/06/2013, 9:06 AM

## 2013-10-06 NOTE — Consult Note (Signed)
Referring Provider: Charlynne Cousins, MD Primary Care Physician:  No primary provider on file. Primary Gastroenterologist:  Barney Drain, MD  Reason for Consultation:  Liver disease, ascites  HPI: Wanda [name] is a 38 y.o. female admitted with complaints of generalized weakness and worsening anasarca, decreased urinary output.  Patient was initially seen back at the beginning of this month when she presented with painless jaundice, loose stools (after constipation), some ascites, hepatomegaly. Work up that time was negative for viral hepatitis, PBC, autoimmune hepatitis. Small volume paracentesis was unremarkable including negative cytology. She did have mildly elevated IgA and ferritin elevated as well. Her iron level was normal but saturations could not be calculated due to low UIBC. Her ceruloplasmin was normal. She denied alcohol use or drug use. Denied any concerning medications. At time of her discharge earlier this month, her bilirubin had dropped from 5.2-3.4 in 2 days. Her alkaline phosphatase had dropped from 168-136. Her AST was essentially unchanged at 105. Her ALT normal at 30. She was supposed to have followup blood work and an office visit with [place] that she failed to do this. We could not get in touch with the patient.  Per discharge summary she was sent out on folic acid 1 mg daily, Lasix 40 mg daily, pantoprazole 40 mg daily, aldactone 100 mg daily, Cipro and Flagyl. She reports she stopped her antibiotics and fluid pills due to nausea and decreased urinary. She was taken folic acid and MVI on occasional. Denies other medication including herbal medications and tylenol. Again denies etoh use. States she has had a drink on occasion but nothing recent or regular. Boyfriend offered up that she had had a drink at times.  She presented back to the emergency department yesterday with complaints of generalized weakness. She was noted to have a lipase of 179. Of note her lipase was not  checked last admission. She was also noted to have acute renal failure with a creatinine of 3.11, had been entirely normal previously. Her potassium was 2.6. Her total bilirubin was up to 4.5, alkaline phosphatase up to 283, AST up to 179, ALT up to 38, albumin 2.5. Her white blood cell count was up to 18,000, hemoglobin 9.1, hematocrit 27.5, MCV 115.5, platelets 151,000. Of note patient had abnormally low folate level last admission. Her lactic acid level was elevated.   She denies abdominal pain. No vomiting but complains of nausea. No heartburn. Increased abdominal swelling. Poor appetite. 5 days ago she noted that her urine output had diminished. She was going to the bathroom only once per day. She had a bowel movement yesterday. No melena or rectal bleeding. Had been 5 days without stool. She's had a cough for weeks. Denies any shortness of breath or dyspnea on exertion. States her menstrual cycles have been regular, last one a month ago. Denies any episodes of confusion but has been sleeping a lot.  Prior to Admission medications   Not on File  Patient reports taking her folic acid and MVI on occasion.  Current Facility-Administered Medications  Medication Dose Route Frequency Provider Last Rate Last Dose  . 0.9 %  sodium chloride infusion   Intravenous STAT Delora Fuel, MD      . 0.9 % NaCl with KCl 40 mEq / L  infusion   Intravenous Continuous Charlynne Cousins, MD      . acetaminophen (TYLENOL) tablet 650 mg  650 mg Oral Q6H PRN Bonnielee Haff, MD       Or  .  acetaminophen (TYLENOL) suppository 650 mg  650 mg Rectal Q6H PRN Bonnielee Haff, MD      . folic acid (FOLVITE) tablet 1 mg  1 mg Oral Daily Bonnielee Haff, MD   1 mg at 10/06/13 1025  . ondansetron (ZOFRAN) tablet 4 mg  4 mg Oral Q6H PRN Bonnielee Haff, MD       Or  . ondansetron (ZOFRAN) injection 4 mg  4 mg Intravenous Q6H PRN Bonnielee Haff, MD      . ondansetron [facility]) injection 4 mg  4 mg Intravenous R5J PRN Delora Fuel,  MD      . pantoprazole (PROTONIX) EC tablet 40 mg  40 mg Oral QAC breakfast Bonnielee Haff, MD   40 mg at 10/06/13 1025  . potassium chloride SA (K-DUR,KLOR-CON) CR tablet 40 mEq  40 mEq Oral BID Lezlie Octave Black, NP      . sodium chloride 0.9 % injection 3 mL  3 mL Intravenous Q12H Bonnielee Haff, MD   3 mL at 10/06/13 1024  . spironolactone (ALDACTONE) tablet 25 mg  25 mg Oral Daily Charlynne Cousins, MD        Allergies as of 10/05/2013  . (No Known Allergies)    Past Medical History  Diagnosis Date  . GERD (gastroesophageal reflux disease)   . Liver disease   . Acute renal failure     Past Surgical History  Procedure Laterality Date  . None      Family History  Problem Relation Age of Onset  . Heart disease Mother   . Colon cancer Neg Hx   . Liver disease Neg Hx     History   Social History  . Marital Status: Single    Spouse Name: N/A    Number of Children: 1  . Years of Education: N/A   Occupational History  . unemployed    Social History Main Topics  . Smoking status: Former Smoker -- 0.25 packs/day for 20 years    Types: Cigarettes  . Smokeless tobacco: Never Used  . Alcohol Use: No  . Drug Use: No  . Sexual Activity: Yes    Partners: Male    Birth Control/ Protection: None   Other Topics Concern  . Not on file   Social History Narrative  . No narrative on file     ROS:  General: see hpi. Eyes: Negative for vision changes.  ENT: Negative for hoarseness, difficulty swallowing , nasal congestion. CV: Negative for chest pain, angina, palpitations, dyspnea on exertion. + peripheral edema.  Respiratory: Negative for dyspnea at rest, dyspnea on exertion,sputum, wheezing. See hpi. GI: See history of present illness. GU:  Negative for dysuria, hematuria, urinary incontinence, urinary frequency, nocturnal urination. C/o oliguria. MS: Negative for joint pain, low back pain.  Derm: + for rash and itching.  Neuro: Negative for weakness, abnormal  sensation, seizure, frequent headaches, memory loss, confusion.  Psych: Negative for anxiety, depression, suicidal ideation, hallucinations.  Endo: see hpi.  Heme: Negative for bruising or bleeding. Allergy: Negative for rash or hives.       Physical Examination: Vital signs in last 24 hours: Temp:  [97.8 F (36.6 C)-98.4 F (36.9 C)] 98.2 F (36.8 C) (02/27 0455) Pulse Rate:  [116-125] 123 (02/27 0455) Resp:  [18] 18 (02/26 2235) BP: (91-119)/(41-62) 91/52 mmHg (02/27 0455) SpO2:  [91 %-97 %] 94 % (02/27 0455) Weight:  [178 lb (80.74 kg)] 178 lb (80.74 kg) (02/27 0500) Last BM Date: 09/30/13  General: chronically ill-appearing  WF in no acute distress. Boyfriend at bedside. Head: Normocephalic, atraumatic.   Eyes: Conjunctiva pale, + icterus. Mouth: Oropharyngeal mucosa dry, no lesions erythema or exudate. Neck: Supple without thyromegaly, masses, or lymphadenopathy.  Lungs: Clear to auscultation bilaterally.  Heart: Regular rate and rhythm, no murmurs rubs or gallops.  Abdomen: Bowel sounds are normal, nontender. Distended but not tense. +fluid wave.   Rectal: not performed Extremities: No lower extremity edema, clubbing, deformity.  Neuro: Alert and oriented x 4 , grossly normal neurologically.  Skin: Warm and dry, erythematous fine rash noted on upper extremities. +spider telangiectasias on face, +jaundice  Psych: Alert and cooperative, normal mood and affect.        Intake/Output from previous day: 02/26 0701 - 02/27 0700 In: 323.3 [I.V.:323.3] Out: -  Intake/Output this shift: Total I/O In: 240 [P.O.:240] Out: -   Lab Results: CBC  Recent Labs  10/05/13 2247 10/06/13 0447  WBC 18.0* 15.5*  HGB 9.1* 8.2*  HCT 27.5* 24.7*  MCV 115.5* 116.5*  PLT 151 114*   BMET  Recent Labs  10/05/13 2247 10/06/13 0447  NA 134* 134*  K 2.6* 2.7*  CL 81* 86*  CO2 24 22  GLUCOSE 102* 81  BUN 24* 24*  CREATININE 3.11* 3.10*  CALCIUM 8.6 7.9*   LFT  Recent  Labs  10/05/13 2247 10/06/13 0447  BILITOT 4.5* 4.2*  ALKPHOS 283* 250*  AST 179* 160*  ALT 38* 34  PROT 7.8 6.9  ALBUMIN 2.5* 2.2*   09/12/13 Hep B surf Ag: neg HCV Ab: neg Hep A IgM: neg Hep B C IgM: neg Ceruloplasmin:37 (normal) IgA elevated 528 Antismooth muscle Ab: neg ANA: neg Mitochondrial Ab: neg  Lab Results  Component Value Date   IRON 112 09/12/2013   TIBC NOT CALC 09/12/2013   FERRITIN 585* 09/12/2013   Lab Results  Component Value Date   FOLATE 1.3* 09/12/2013   Lab Results  Component Value Date   VITAMINB12 741 09/12/2013   Lab Results  Component Value Date   LIPASE 179* 10/05/2013     PT/INR ) Recent Labs  10/06/13  LABPROT 15.5*  INR 1.26    Imaging Studies: US Abdomen Complete  09/12/2013   CLINICAL DATA:  Jaundice  EXAM: ULTRASOUND ABDOMEN COMPLETE  COMPARISON:  CT abdomen and pelvis 09/11/2013  FINDINGS: Gallbladder:  Sludge within gallbladder. No gallbladder wall thickening or sonographic Murphy sign.  Common bile duct:  Diameter: 3 mm diameter, normal  Liver:  Echogenic, which can be seen with fatty infiltration and cirrhosis is well as certain infiltrative disorders. A few images suggests minimal nodularity of the hepatic surface. No discrete hepatic mass identified. No intrahepatic biliary dilatation. Diminished blood flow is identified in the portal vein.  IVC:  No abnormality visualized.  Pancreas:  Limited due to hepatic enlargement and increased hepatic echogenicity. Small portion of the pancreatic body and proximal tail is normal appearance, remainder obscured.  Spleen:  Normal appearance, 10.5 cm length  Right Kidney:  Length: 11.7 cm.  Normal morphology without mass or hydronephrosis.  Left Kidney:  Length: 11.2 cm.  Normal morphology without mass or hydronephrosis.  Abdominal aorta:  Inadequately visualized for assessment due to combination of enlarged echogenic liver and Central bowel loops due to ascites.  Other findings:  Free intraperitoneal  fluid consistent with ascites.  IMPRESSION: Enlarged echogenic liver which can be seen with fatty infiltration and cirrhosis as well as certain infiltrative disorders.  Question minimal nodularity of the hepatic surface on  a few images, unable to exclude cirrhosis.  Ascites.  Question diminished flow in the portal vein, see visceral Doppler ultrasound report.   Electronically Signed   By: Lavonia Dana M.D.   On: 09/12/2013 14:55   Ct Abdomen Pelvis W Contrast  09/11/2013   CLINICAL DATA:  Diarrhea.  Leg weakness.  Gastrointestinal bleeding.  EXAM: CT ABDOMEN AND PELVIS WITH CONTRAST  TECHNIQUE: Multidetector CT imaging of the abdomen and pelvis was performed using the standard protocol following bolus administration of intravenous contrast.  CONTRAST:  63mL OMNIPAQUE IOHEXOL 300 MG/ML SOLN, 136mL OMNIPAQUE IOHEXOL 300 MG/ML SOLN  COMPARISON:  None.  FINDINGS: Lung bases are clear.  No pleural or pericardial fluid.  There is a large amount of ascites distributed freely throughout the peritoneal space. No loculated components. The liver is markedly enlarged and extremely fatty. Certain areas of the liver show extreme fatty change, particularly the ventral aspect and the caudate lobe. No suspicion of mass lesion. No calcified gallstones. No sign of ductal dilatation. The spleen is normal. Hepatic vein and portal veins are patent. The pancreas is normal. The adrenal glands are normal. The kidneys are normal. The aorta shows atherosclerosis but no aneurysm. The IVC is normal. No retroperitoneal mass or adenopathy. No free intraperitoneal air. No definite focal bowel lesion. The colon may show slight wall thickening diffusely which could be seen with colitis or with low systemic protein. Uterus and adnexal regions appear normal. The appendix is normal. No bony abnormality.  IMPRESSION: Ascites diffusely distributed.  Profound hepatomegaly and fatty change of the liver.  Possible wall thickening of the colon, which could  be seen with colitis or with low protein.  Considerations include primary steatosis, toxin exposure, and viral hepatitis.   Electronically Signed   By: Nelson Chimes M.D.   On: 09/11/2013 21:18   [place] Art/ven Flow Abd Pelv Doppler Limited  09/12/2013   CLINICAL DATA:  Jaundice, hepatomegaly, ascites  EXAM: DUPLEX ULTRASOUND OF LIVER LIMITED  TECHNIQUE: Color and duplex Doppler ultrasound was performed to evaluate the hepatic in-flow and out-flow vessels.  COMPARISON:  Ultrasound abdomen 09/12/2013, CT abdomen and pelvis 09/11/2013  FINDINGS: Portal Vein: Suboptimally visualized due to increased hepatic echogenicity. Main portal vein appears somewhat narrow and compressed question secondary to diffuse hepatic edema as identified on prior CT. Blood flow is present within the portal vein on color Doppler imaging, hepatopetal in direction. Small amount of periportal edema is also noted.  Hepatic veins: Grossly patent, also suboptimally evaluated due to poor sound transmission through echogenic hepatic parenchyma.  Hepatic artery: Patent, with elevated velocity of 382 cm/sec distally at porta hepatis.  Varices: No upper abdominal varices identified.  Ascites: Mild ascites present in upper abdomen.  Markedly increased hepatic echogenicity noted.  IMPRESSION: Right upper quadrant ascites.  Hepatic and portal veins appear patent though somewhat narrow, suspect mildly compressed by edematous hepatic parenchyma.  Hepatic and portal veins were patent on prior CT of 09/11/2013 as well.   Electronically Signed   By: Lavonia Dana M.D.   On: 09/12/2013 15:56   US Paracentesis  09/12/2013   CLINICAL DATA:  New onset ascites, jaundice  EXAM: ULTRASOUND GUIDED DIAGNOSTIC AND THERAPEUTIC PARACENTESIS  COMPARISON:  CT abdomen and pelvis 09/11/2013.  PROCEDURE: Procedure, benefits, and risks of procedure were discussed with patient.  Written informed consent for procedure was obtained.  Time out protocol followed.  Adequate collection  of ascites localized in left lower quadrant.  Skin prepped and draped in usual  sterile fashion.  Skin and soft tissues anesthetized with 9 mL of 1% lidocaine.  5 [place] Yueh catheter placed into peritoneal cavity.  1180 mL of yellow fluid aspirated by vacuum bottle suction.  Procedure tolerated well by patient without immediate complication.  FINDINGS: A total of approximately 1180 mL of yellow fluid was removed. A fluid sample of 180 mL was sent for laboratory analysis.  IMPRESSION: Successful ultrasound guided paracentesis yielding 1180 mL of ascites.   Electronically Signed   By: Lavonia Dana M.D.   On: 09/12/2013 17:38   Impression: 38 year old lady who presents for readmission due to generalized weakness, anasarca, now with diminished urinary output and acute renal failure. Extensive evaluation of her liver disease as outlined above. Etiology remains unclear at this time. She continues to have decline in hepatic function. Her LFTs continue to rise. She has significant hypoalbuminemia. Initial screening for Wilson's was unremarkable, but detailed testing to be done. She continues to deny any alcohol use. Even though ratio of AST to ALT is greater than 2 this can be seen with disorders other than alcoholic hepatitis such as Wilson's disease. She has mildly elevated lipase but may not be significant in the setting of acute renal failure. CT earlier this month showed no abnormalities of the pancreas.  She continues to have macrocytic anemia, her folate level was significantly low. Now with mild thrombocytopenia. Cannot rule out cirrhosis at this point.  Leukocytosis, improved. Etiology unclear. Doubt pancreatitis given patient has no abdominal pain. Patient currently not on any antibiotics.  Acute renal failure, being followed by nephrology. Hepatorenal syndrome consideration.  Rash upper extremities.  Plan: 1. Further labs/urine for Wilson's disease. 2. If unremarkable, consider HFE  genetics. 3. Patient undecided about additional abd paracentesis at this time. She is not complaining of discomfort or associated SOB. Doubt SBP. To discuss with Dr. Gala Romney. 4. She may need a liver biopsy in very near future if testing remains inconclusive.  5. Consider reimaging of the liver/portal venous system. To discuss with Dr. Gala Romney.  We would like to thank you for the opportunity to participate in the care of KAISHA CULLIGAN.   LOS: 1 day   Neil Crouch  10/06/2013, 11:10 AM  Addendum: Discussed with Dr. Gala Romney. I have made arrangements for a transjugular liver biopsy with hepatic vein wedge pressures through interventional radiology on Monday. Jannifer Franklin, PA from IR to make final arrangements.   I have also discussed with Dr. Tresa Moore, [facility] Pathologist. Notified of upcoming liver biopsy and need to evaluate copper levels and for occult autoimmune hepatitis.

## 2013-10-06 NOTE — Consult Note (Addendum)
Attending note:  As outlined above. This relatively young pt has a markedly abnormal liver on cross-sectional imaging, elevated lft's in a setting of multiorgan dysfunction.  Etiology not known at this time. Obtaining histology along with hepatic / portal vein hemodynamic measurements needed to help sort out liver issues including degree of fibrosis / cirrhosis , if any.  I have discussed the risk and benefits of liver biopsy and hemodynamic measurements. The patient wants to think about it over the weekend. We will also screen for celiac disease.

## 2013-10-06 NOTE — Plan of Care (Signed)
Problem: Phase I Progression Outcomes Goal: Hemodynamically stable Outcome: Progressing Continuing to work with the team in regards to her blood pressure.

## 2013-10-06 NOTE — Progress Notes (Signed)
[facility] PROGRESS NOTE  NIMRAT WOOLWORTH XLK:[id number] DOB: [birth year] DOA: 10/05/2013 PCP: No primary provider on file.   Brief summary: KAYLEENA EKE is a 38 y.o. female with a past medical history of recently diagnosed liver disease of unclear etiology with jaundice and ascites. She was recently hospitalized earlier this month and was discharged on February 4. She was discharged on diuretics and antibiotics. Presented with worsening weakness and admitted with acute renal failure and hypokalemia, tachycardia, hypochloremia with elevated lactic acid. Of note, chart review indicates patient has been difficult to contact to complete liver disease workup.   Assessment/Plan: #1 acute renal failure: Could be due to diuretic use as she was recently prescribed but also a concern for Hepatorenal syndrome. Continue  IV fluids per nephrology. Resume aldactone per renal.  Hold her diuretics for now. Recheck in am. If no improvement consider renal ultrasound.   #2 unspecified liver disease with ascites and jaundice: Worsening transaminitis. Total bili 4.2.  Consult gastroenterology to assist with management. She may require liver biopsy. She also has an elevated lipase level. She does have significantly distended abdomen, but is nontender.   #3 severe hypokalemia: only minimal improvement.  This will be repleted orally and intravenously. Repeat potassium level in the morning.   #4 unspecified leukocytosis: She is afebrile. She has had elevated WBC in the past. Etiology remains unclear. Continue to monitor. No definite signs of infection.   #5 macrocytic anemia: Hemoglobin appears to be stable compared to previous values. Continue to monitor.   #6 sinus tachycardia: Possibly due to volume depletion: Monitor on telemetry.  May require further workup if heart rate does not improve. Little improvement since last night.  TSH was normal earlier this month.    Code Status: full Family Communication:  none present Disposition Plan: when clinically improved   Consultants:  Dr. Lowanda Foster Nephrology  Procedures:  none  Antibiotics:  none  HPI/Subjective: Sitting on side of bed. Denies pain/discomfort. Denies any improvement in weakness  Objective: Filed Vitals:   10/06/13 0455  BP: 91/52  Pulse: 123  Temp: 98.2 F (36.8 C)  Resp:     Intake/Output Summary (Last 24 hours) at 10/06/13 1004 Last data filed at 10/06/13 0900  Gross per 24 hour  Intake 563.33 ml  Output      0 ml  Net 563.33 ml   Filed Weights   10/06/13 0500  Weight: 80.74 kg (178 lb)    Exam:   General:  Well nourished  Cardiovascular: tachycardic but regular. No MGR trace LE edema  Respiratory: normal effort BS somewhat diminished but clear. No wheeze no rhonchi  Abdomen: distended but non-tender. Ascites. +BS.   Musculoskeletal: no clubbing or cyanosis  Data Reviewed: Basic Metabolic Panel:  Recent Labs Lab 10/05/13 2247 10/06/13 0447  NA 134* 134*  K 2.6* 2.7*  CL 81* 86*  CO2 24 22  GLUCOSE 102* 81  BUN 24* 24*  CREATININE 3.11* 3.10*  CALCIUM 8.6 7.9*  MG  --  1.5   Liver Function Tests:  Recent Labs Lab 10/05/13 2247 10/06/13 0447  AST 179* 160*  ALT 38* 34  ALKPHOS 283* 250*  BILITOT 4.5* 4.2*  PROT 7.8 6.9  ALBUMIN 2.5* 2.2*    Recent Labs Lab 10/05/13 2247  LIPASE 179*    Recent Labs Lab 10/05/13 2248  AMMONIA 42   CBC:  Recent Labs Lab 10/05/13 2247 10/06/13 0447  WBC 18.0* 15.5*  NEUTROABS 14.5*  --   HGB 9.1*  8.2*  HCT 27.5* 24.7*  MCV 115.5* 116.5*  PLT 151 114*   Cardiac Enzymes: No results found for this basename: CKTOTAL, CKMB, CKMBINDEX, TROPONINI,  in the last 168 hours BNP (last 3 results) No results found for this basename: PROBNP,  in the last 8760 hours CBG: No results found for this basename: GLUCAP,  in the last 168 hours  No results found for this or any previous visit (from the past 240 hour(s)).   Studies: No  results found.  Scheduled Meds: . sodium chloride   Intravenous STAT  . folic acid  1 mg Oral Daily  . pantoprazole  40 mg Oral QAC breakfast  . sodium chloride  3 mL Intravenous Q12H  . spironolactone  50 mg Oral Daily   Continuous Infusions: . 0.9 % NaCl with KCl 40 mEq / L      Principal Problem:   ARF (acute renal failure) Active Problems:   Jaundice   Ascites   Hypokalemia   Leukocytosis, unspecified   Generalized weakness   Transaminitis   Acute renal failure    Time spent: 35 minutes    [facility] Pager [phone]. If 7PM-7AM, please contact night-coverage at [email or web address], password [facility] 10/06/2013, 10:04 AM  LOS: 1 day

## 2013-10-06 NOTE — Plan of Care (Signed)
Problem: Phase I Progression Outcomes Goal: Voiding-avoid urinary catheter unless indicated Outcome: Not Progressing Patient requires strict I/o's.  She has very little output.

## 2013-10-06 NOTE — ED Notes (Signed)
No urine obtained with in and out cath

## 2013-10-06 NOTE — ED Notes (Signed)
CRITICAL VALUE ALERT  Critical value received:  Potassium 2.6  Date of notification: 10/05/13 Time of notification: 2344 Critical value read back:yes Nurse who received alert: smoore rn MD notified (1st page): dr Roxanne Mins Time of first page: 2346 MD notified (2nd page):  Time of second page:  Responding MD:  Dr Roxanne Mins Time MD responded: [phone]

## 2013-10-06 NOTE — Progress Notes (Signed)
UR chart review completed.  

## 2013-10-06 NOTE — Progress Notes (Signed)
CRITICAL VALUE ALERT  Critical value received:  Potassium 2.7  Date of notification:  10/06/13  Time of notification:  0605  Critical value read back:yes  Nurse who received alert:  S. Chrisandra Carota  MD notified (1st page):  [place] Pink  Time of first page:  0608  MD notified (2nd page):  Time of second page:  Responding MD:  [place] Pink  Time MD responded:  [phone]

## 2013-10-06 NOTE — Progress Notes (Signed)
-   Check Urine NA. - Strict I and Os', place foley. appreciate renal assistance. - Start Lactulose 3-4 BM.

## 2013-10-07 DIAGNOSIS — R748 Abnormal levels of other serum enzymes: Secondary | ICD-10-CM

## 2013-10-07 DIAGNOSIS — D62 Acute posthemorrhagic anemia: Secondary | ICD-10-CM

## 2013-10-07 LAB — URINALYSIS, ROUTINE W REFLEX MICROSCOPIC
GLUCOSE, UA: 100 mg/dL — AB
KETONES UR: 15 mg/dL — AB
NITRITE: POSITIVE — AB
PH: 5 (ref 5.0–8.0)
Protein, ur: 100 mg/dL — AB
Specific Gravity, Urine: >1.03 — ABNORMAL HIGH (ref 1.005–1.030)
Urobilinogen, UA: 1 mg/dL (ref 0.0–1.0)

## 2013-10-07 LAB — BASIC METABOLIC PANEL
BUN: 29 mg/dL — AB (ref 6–23)
CALCIUM: 8.3 mg/dL — AB (ref 8.4–10.5)
CO2: 23 mEq/L (ref 19–32)
Chloride: 97 mEq/L (ref 96–112)
Creatinine, Ser: 2.79 mg/dL — ABNORMAL HIGH (ref 0.50–1.10)
GFR calc Af Amer: 23 mL/min — ABNORMAL LOW (ref >90)
GFR calc non Af Amer: 20 mL/min — ABNORMAL LOW (ref >90)
GLUCOSE: 102 mg/dL — AB (ref 70–99)
POTASSIUM: 5.1 meq/L (ref 3.7–5.3)
Sodium: 135 mEq/L — ABNORMAL LOW (ref 137–147)

## 2013-10-07 LAB — CREATININE, URINE, RANDOM: Creatinine, Urine: 359.05 mg/dL

## 2013-10-07 LAB — URINE MICROSCOPIC-ADD ON

## 2013-10-07 LAB — C3 COMPLEMENT: C3 Complement: 157 mg/dL (ref 90–180)

## 2013-10-07 LAB — CBC
HCT: 23.4 % — ABNORMAL LOW (ref 36.0–46.0)
HEMOGLOBIN: 7.7 g/dL — AB (ref 12.0–15.0)
MCH: 39.3 pg — AB (ref 26.0–34.0)
MCHC: 32.9 g/dL (ref 30.0–36.0)
MCV: 119.4 fL — AB (ref 78.0–100.0)
Platelets: 124 10*3/uL — ABNORMAL LOW (ref 150–400)
RBC: 1.96 MIL/uL — ABNORMAL LOW (ref 3.87–5.11)
RDW: 18.6 % — ABNORMAL HIGH (ref 11.5–15.5)
WBC: 14.5 10*3/uL — ABNORMAL HIGH (ref 4.0–10.5)

## 2013-10-07 LAB — PREGNANCY, URINE: PREG TEST UR: NEGATIVE

## 2013-10-07 LAB — ALPHA-1-ANTITRYPSIN: A-1 Antitrypsin, Ser: 362 mg/dL — ABNORMAL HIGH (ref 90–200)

## 2013-10-07 LAB — NA AND K (SODIUM & POTASSIUM), RAND UR
Potassium Urine: 52 mEq/L
Sodium, Ur: <20 mEq/L

## 2013-10-07 LAB — C4 COMPLEMENT: Complement C4, Body Fluid: 24 mg/dL (ref 10–40)

## 2013-10-07 MED ORDER — FUROSEMIDE 10 MG/ML IJ SOLN
100.0000 mg | Freq: Two times a day (BID) | INTRAVENOUS | Status: DC
  Administered 2013-10-07 – 2013-10-08 (×3): 100 mg via INTRAVENOUS
  Filled 2013-10-07 (×5): qty 10

## 2013-10-07 MED ORDER — OXYCODONE-ACETAMINOPHEN 5-325 MG PO TABS
1.0000 | ORAL_TABLET | ORAL | Status: DC | PRN
  Administered 2013-10-08 – 2013-10-10 (×6): 2 via ORAL
  Filled 2013-10-07 (×3): qty 2
  Filled 2013-10-07: qty 1
  Filled 2013-10-07 (×3): qty 2

## 2013-10-07 MED ORDER — HYDROMORPHONE HCL PF 1 MG/ML IJ SOLN
0.5000 mg | INTRAMUSCULAR | Status: DC | PRN
  Administered 2013-10-07 – 2013-10-09 (×4): 0.5 mg via INTRAVENOUS
  Filled 2013-10-07 (×4): qty 1

## 2013-10-07 MED ORDER — DEXTROSE 5 % IV SOLN
1.0000 g | INTRAVENOUS | Status: DC
  Administered 2013-10-07 – 2013-10-09 (×3): 1 g via INTRAVENOUS
  Filled 2013-10-07 (×4): qty 10

## 2013-10-07 NOTE — Progress Notes (Signed)
Notified via text page  Dr. Percell Belt of the patients abnormal labs.  Esp. The U/a and urine Microscopic, Hgb.  I also stated that the patient has had poor urinary out put.

## 2013-10-07 NOTE — Progress Notes (Signed)
Subjective: Interval History: has complaints weakness and leg pain when she is walking back and forth from the bathroom. Her appetite is poor but she doesn't have any nausea no vomiting. Patient also denies any difficulty in breathing..  Objective: Vital signs in last 24 hours: Temp:  [98 F (36.7 C)-98.4 F (36.9 C)] 98.4 F (36.9 C) (02/28 0551) Pulse Rate:  [122-123] 122 (02/28 0551) Resp:  [20-22] 20 (02/28 0551) BP: (112-121)/(67-72) 112/72 mmHg (02/28 0551) SpO2:  [93 %-97 %] 93 % (02/28 0551) Weight:  [84.369 kg (186 lb)] 84.369 kg (186 lb) (02/28 0551) Weight change: 3.629 kg (8 lb)  Intake/Output from previous day: 02/27 0701 - 02/28 0700 In: 720 [P.O.:720] Out: 190 [Urine:190] Intake/Output this shift:    General appearance: alert, cooperative and no distress Resp: diminished breath sounds bilaterally Cardio: regular rate and rhythm, S1, S2 normal, no murmur, click, rub or gallop GI: Distended, nontender and no rebound tenderness. Extremities: edema 2+ edema bilaterally.  Lab Results:  Recent Labs  10/06/13 0447 10/07/13 0554  WBC 15.5* 14.5*  HGB 8.2* 7.7*  HCT 24.7* 23.4*  PLT 114* 124*   BMET:  Recent Labs  10/06/13 0447 10/07/13 0554  NA 134* 135*  K 2.7* 5.1  CL 86* 97  CO2 22 23  GLUCOSE 81 102*  BUN 24* 29*  CREATININE 3.10* 2.79*  CALCIUM 7.9* 8.3*   No results found for this basename: PTH,  in the last 72 hours Iron Studies: No results found for this basename: IRON, TIBC, TRANSFERRIN, FERRITIN,  in the last 72 hours  Studies/Results: No results found.  I have reviewed the patient's current medications.  Assessment/Plan: Problem #1 acute kidney injury her BUN is 29 creatinine is 2.79. Presently even though her BUN and creatinine seems to be coming down patient remained oliguric. Problem #2 hypokalemia: Potassium is 5.1. Patient on potassium supplement and also Aldactone. Problem #3 anemia her hemoglobin 7.7 hematocrit was 3.4 seems to  be declining. Problem #4 anasarca: Presently patient with poor urine output. And her anasarca seems to be worsening. Problem #5 liver cirrhosis  Problem #6 metabolic bone disease: Calcium and phosphorus in range Problem #7 possible UTI. Patient on antibiotics. Her white blood cell count is high but improving. Plan: DC potassium supplement Will start patient on Lasix 100 mg IV twice a day We'll DC IV fluid We'll check her basic metabolic panel and CBC in the morning. We'll check iron studies in the morning.   LOS: 2 days   Willamae Demby S 10/07/2013,8:42 AM

## 2013-10-07 NOTE — Progress Notes (Signed)
Patient ID: Wanda [name], female   DOB: 05-Oct-1975, 38 y.o.   MRN: [id number]  [facility] PROGRESS NOTE  Wanda [name] QVZ:[id number] DOB: 05/30/1976 DOA: 10/05/2013 PCP: No primary provider on file.  Brief narrative: Pt is 38 y.o. female with recently diagnosed liver disease of unclear etiology with jaundice and ascites. She was recently hospitalized earlier this month and was discharged on February 4th. She was discharged on diuretics and antibiotics. Presented with worsening weakness and admitted with acute renal failure and hypokalemia, tachycardia, with elevated lactic acid.  Of note, chart review indicates patient has been difficult to contact to complete liver disease workup.   Assessment/Plan:  Principal Problem:   ARF (acute renal failure) - unclear etiology at this time, pre renal vs ATN - Cr is trending down - appreciate nephrology assistance - pt currently on spironolactone, Lasix was startled this AM - pt with poor urine output, will ask for bladder scan, lasix started this AM as noted above  - continue to monitor intake and output, daily weights, weight today 186 lbs Active Problems:   UTI - UA and pt's symptoms suggestive of UTI - will place on Rocephin today 2/28 and will order urine culture    Hypokalemia - supplemented and WNL this AM, slightly on the high end of normal - hold off on supplementation for now and repeat BMP in AM   Transaminitis with jaundice and ascites  - unclear etiology at this time - current work up unrevealing, including unremarkable hepatitis panel, complement levels WNL - anti smooth muscle ab, anti mitochondrial ab WNL, ANA negative  - A1A (alpha 1 antitrypsin) elevated  - plan for liver transjugular liver biopsy with hepatic vein wedge pressures through interventional radiology on Monday. Pt initially declined to have any procedures. I spoke with her extensively what would be the benefit of doing biopsy, we talked about A1A being  elevated and what it means and how it can affect other organs. She said she will think about it. - appreciate GI input - LFT's are slighlty better this AM, continue to monitor  - repeat lipase in AM   Leukocytosis, unspecified - most likely secondary to UTI, WBC trending down slowly  - UA suggestive of it and will therefore place on Rocephin - order urine culture as noted above  - repeat lactic acid in AM   Anemia, acute on chronic blood loss with macrocytosis - Hg on admission 9.1 --> 7.7 this AM - no signs of active bleeding - hold off on transfusion at this time, transfuse ig Hg < 7.5 - repeat CBC in AM   Sinus tachycardia - continue to monitor on telemetry  - no complaints of chest pain    Thrombocytopenia - overall remains stable - no signs of active bleeding - repeat CBC in AM  Code Status: Full Family Communication: Pt at bedside  Disposition Plan: Remains inpatient   Leisa Lenz, MD  [facility] Pager [phone]  If 7PM-7AM, please contact night-coverage [email or web address] Password [email or web address] 10/07/2013, 8:19 AM   LOS: 2 days   Consultants:  Nephrology   GI  Procedures:  None  Antibiotics:  Rocephin 2/28 -->  HPI/Subjective: No events overnight.   Objective: Filed Vitals:   10/06/13 0500 10/06/13 1507 10/06/13 2151 10/07/13 0551  BP:  121/67 121/68 112/72  Pulse:  123 122 122  Temp:  98.1 F (36.7 C) 98 F (36.7 C) 98.4 F (36.9 C)  TempSrc:  Oral Oral Oral  Resp:  20 22  20  Height: 5\' 2"  (1.575 m)     Weight: 80.74 kg (178 lb)   84.369 kg (186 lb)  SpO2:  97% 97% 93%    Intake/Output Summary (Last 24 hours) at 10/07/13 0819 Last data filed at 10/07/13 0400  Gross per 24 hour  Intake    720 ml  Output    190 ml  Net    530 ml    Exam:   General:  Pt is alert, follows commands appropriately, not in acute distress  Cardiovascular: Regular rhythm, tachycardic, S1/S2, no murmurs, no rubs, no gallops  Respiratory: Clear to auscultation  bilaterally, no wheezing, no crackles, no rhonchi  Abdomen: Soft, non tender, non distended, bowel sounds present, no guarding  Extremities: +2-3 LE pitting edema, pulses DP and PT palpable bilaterally  Neuro: Grossly nonfocal  Data Reviewed: Basic Metabolic Panel:  Recent Labs Lab 10/05/13 2247 10/06/13 0447 10/06/13 1222 10/07/13 0554  NA 134* 134*  --  135*  K 2.6* 2.7*  --  5.1  CL 81* 86*  --  97  CO2 24 22  --  23  GLUCOSE 102* 81  --  102*  BUN 24* 24*  --  29*  CREATININE 3.11* 3.10*  --  2.79*  CALCIUM 8.6 7.9*  --  8.3*  MG  --  1.5  --   --   PHOS  --   --  2.4  --    Liver Function Tests:  Recent Labs Lab 10/05/13 2247 10/06/13 0447  AST 179* 160*  ALT 38* 34  ALKPHOS 283* 250*  BILITOT 4.5* 4.2*  PROT 7.8 6.9  ALBUMIN 2.5* 2.2*    Recent Labs Lab 10/05/13 2247  LIPASE 179*    Recent Labs Lab 10/05/13 2248  AMMONIA 42   CBC:  Recent Labs Lab 10/05/13 2247 10/06/13 0447 10/07/13 0554  WBC 18.0* 15.5* 14.5*  NEUTROABS 14.5*  --   --   HGB 9.1* 8.2* 7.7*  HCT 27.5* 24.7* 23.4*  MCV 115.5* 116.5* 119.4*  PLT 151 114* 124*    Studies: No results found.  Scheduled Meds: . folic acid  1 mg Oral Daily  . pantoprazole  40 mg Oral QAC breakfast  . potassium chloride  40 mEq Oral BID  . spironolactone  25 mg Oral Daily

## 2013-10-07 NOTE — Progress Notes (Signed)
Notified Dr. Rogers Blocker of the patients c/o pain with no relief from the tylenol order. She rates the pain as a constant ache, and rates a 8 non a scale of 8-10.  New orders given and followed  Wanda [name]

## 2013-10-07 NOTE — Progress Notes (Addendum)
Order placed for Rocephin and urine culture.Also, order placed for bladder scan to determine cause of poor urine output. May need IVF but will hold off until bladder scan done.   Leisa Lenz, MD Pager [phone] Cell [phone]

## 2013-10-08 DIAGNOSIS — R7989 Other specified abnormal findings of blood chemistry: Secondary | ICD-10-CM

## 2013-10-08 DIAGNOSIS — D649 Anemia, unspecified: Secondary | ICD-10-CM

## 2013-10-08 HISTORY — PX: PARACENTESIS: SHX844

## 2013-10-08 LAB — CBC
HEMATOCRIT: 23.4 % — AB (ref 36.0–46.0)
Hemoglobin: 7.6 g/dL — ABNORMAL LOW (ref 12.0–15.0)
MCH: 38.6 pg — ABNORMAL HIGH (ref 26.0–34.0)
MCHC: 32.5 g/dL (ref 30.0–36.0)
MCV: 118.8 fL — ABNORMAL HIGH (ref 78.0–100.0)
Platelets: 123 10*3/uL — ABNORMAL LOW (ref 150–400)
RBC: 1.97 MIL/uL — AB (ref 3.87–5.11)
RDW: 18.6 % — AB (ref 11.5–15.5)
WBC: 13.8 10*3/uL — AB (ref 4.0–10.5)

## 2013-10-08 LAB — COMPREHENSIVE METABOLIC PANEL
ALK PHOS: 235 U/L — AB (ref 39–117)
ALT: 43 U/L — ABNORMAL HIGH (ref 0–35)
AST: 144 U/L — ABNORMAL HIGH (ref 0–37)
Albumin: 2.4 g/dL — ABNORMAL LOW (ref 3.5–5.2)
BILIRUBIN TOTAL: 4.8 mg/dL — AB (ref 0.3–1.2)
BUN: 34 mg/dL — AB (ref 6–23)
CHLORIDE: 93 meq/L — AB (ref 96–112)
CO2: 25 meq/L (ref 19–32)
Calcium: 8.5 mg/dL (ref 8.4–10.5)
Creatinine, Ser: 1.99 mg/dL — ABNORMAL HIGH (ref 0.50–1.10)
GFR calc Af Amer: 35 mL/min — ABNORMAL LOW (ref >90)
GFR calc non Af Amer: 30 mL/min — ABNORMAL LOW (ref >90)
Glucose, Bld: 110 mg/dL — ABNORMAL HIGH (ref 70–99)
Potassium: 4.9 mEq/L (ref 3.7–5.3)
Sodium: 132 mEq/L — ABNORMAL LOW (ref 137–147)
Total Protein: 7.2 g/dL (ref 6.0–8.3)

## 2013-10-08 LAB — LACTIC ACID, PLASMA: Lactic Acid, Venous: 1.1 mmol/L (ref 0.5–2.2)

## 2013-10-08 LAB — IRON AND TIBC: Iron: 116 ug/dL (ref 42–135)

## 2013-10-08 LAB — COPPER, SERUM: COPPER: 108 ug/dL (ref 70–175)

## 2013-10-08 LAB — LIPASE, BLOOD: LIPASE: 188 U/L — AB (ref 11–59)

## 2013-10-08 LAB — AMMONIA: AMMONIA: 53 umol/L (ref 11–60)

## 2013-10-08 LAB — FERRITIN: Ferritin: 895 ng/mL — ABNORMAL HIGH (ref 10–291)

## 2013-10-08 MED ORDER — DEXTROSE 5 % IV SOLN
160.0000 mg | Freq: Two times a day (BID) | INTRAVENOUS | Status: DC
  Administered 2013-10-08 – 2013-10-09 (×2): 160 mg via INTRAVENOUS
  Filled 2013-10-08 (×2): qty 16

## 2013-10-08 MED ORDER — LACTULOSE 10 GM/15ML PO SOLN
10.0000 g | Freq: Every day | ORAL | Status: DC
  Administered 2013-10-08 – 2013-10-09 (×2): 10 g via ORAL
  Administered 2013-10-10: 09:00:00 via ORAL
  Filled 2013-10-08 (×3): qty 30

## 2013-10-08 NOTE — Progress Notes (Signed)
Discussed with the patient that I was notified by Merryl Hacker, PA-C that the patient has been set up to come over to [facility] IR for procedure in the am.  The patient stated that "we could unsign her up" for the procedure because she is not going to do it.  She stated her point blank refusal multiple times even with explained benefit of possibly finding out what was causing her to be so sick.  I will notify the MD of the situation today.  I will not notify [facility]link due to the patients refusal to go at this time.

## 2013-10-08 NOTE — Progress Notes (Signed)
Late Entry   I did notify Dr. Gala Romney of the patients refusal of her going to IR for the liver biopsy and measurements.

## 2013-10-08 NOTE — Progress Notes (Signed)
Notified [facility] link scheduler of the patient requiring transportation from them.  Voiced to Linna Hoff that the patient would have to be at the site by 0830 10/09/13 according to Ascencion Dike PA from [facility].  He stated he would put the transport time in for 0745.

## 2013-10-08 NOTE — Progress Notes (Signed)
Patient ID: Wanda [name], female   DOB: 1976/02/05, 38 y.o.   MRN: [id number]  [facility] PROGRESS NOTE  NADALYN DERINGER MHD:[id number] DOB: [birth year] DOA: 10/05/2013 PCP: No primary provider on file.  Brief narrative: Pt is 38 y.o. female with recently diagnosed liver disease of unclear etiology with jaundice and ascites. She was recently hospitalized earlier this month and was discharged on February 4th. She was discharged on diuretics and antibiotics. Presented with worsening weakness and admitted with acute renal failure and hypokalemia, tachycardia, with elevated lactic acid.  Of note, chart review indicates patient has been difficult to contact to complete liver disease workup.   Assessment/Plan:    ARF (acute renal failure) - unclear etiology at this time, pre renal vs ATN - Cr is trending down today at 1.9.  - appreciate nephrology assistance - pt currently on spironolactone, Lasix 160 BID.  - continue to monitor intake and output.  -Decrease urine out put.     UTI -UA and pt's symptoms suggestive of UTI -Continue with Rocephin day 2.  -Follow urine culture     Hypokalemia - Resolved.     Transaminitis with jaundice and ascites  - unclear etiology at this time - current work up unrevealing, including unremarkable hepatitis panel, complement levels WNL - anti smooth muscle ab, anti mitochondrial ab WNL, ANA negative  - A1A (alpha 1 antitrypsin) elevated  - plan for liver transjugular liver biopsy with hepatic vein wedge pressures through interventional radiology on Monday. -Still thinking about biopsy.  - appreciate GI input - LFT's stable., continue to monitor  - repeat lipase in AM -Started on lactulose.  -Check ammonia level.     Leukocytosis, unspecified - most likely secondary to UTI, WBC trending down slowly  - UA suggestive of it and will therefore place on Rocephin - order urine culture as noted above      Anemia, acute on chronic blood loss  with macrocytosis - Hg on admission 9.1 --> 7.7 this AM - no signs of active bleeding - hold off on transfusion at this time, transfuse ig Hg < 7.5 - repeat CBC in AM    Sinus tachycardia - continue to monitor on telemetry  - no complaints of chest pain     Thrombocytopenia - overall remains stable - no signs of active bleeding - repeat CBC in AM  Code Status: Full Family Communication: Pt at bedside  Disposition Plan: [place], MD  [facility] Pager [phone]  If 7PM-7AM, please contact night-coverage [email or web address] Password [email or web address] 10/08/2013, 10:57 AM   LOS: 3 days   Consultants:  Nephrology   GI  Procedures:  None  Antibiotics:  Rocephin 2/28 -->  HPI/Subjective: No events overnight. Still thinking about liver biopsy. Dyspnea on exertion.   Objective: Filed Vitals:   10/07/13 0551 10/07/13 1347 10/07/13 2142 10/08/13 0604  BP: 112/72 121/84 116/75 115/59  Pulse: 122 124 124 121  Temp: 98.4 F (36.9 C) 98.5 F (36.9 C) 98.5 F (36.9 C) 98 F (36.7 C)  TempSrc: Oral Oral Oral Oral  Resp: 20 20 20 20   Height:      Weight: 84.369 kg (186 lb)   85.548 kg (188 lb 9.6 oz)  SpO2: 93% 94% 95% 98%    Intake/Output Summary (Last 24 hours) at 10/08/13 1057 Last data filed at 10/08/13 0928  Gross per 24 hour  Intake   1440 ml  Output    420 ml  Net   1020 ml  Exam:   General:  Pt is alert, follows commands appropriately, not in acute distress  Cardiovascular: Regular rhythm, tachycardic, S1/S2, no murmurs, no rubs, no gallops  Respiratory: Clear to auscultation bilaterally, no wheezing, no crackles, no rhonchi  Abdomen: Soft, non tender, non distended, bowel sounds present, no guarding  Extremities: +2-3 LE pitting edema, pulses DP and PT palpable bilaterally  Neuro: Grossly nonfocal  Data Reviewed: Basic Metabolic Panel:  Recent Labs Lab 10/05/13 2247 10/06/13 0447 10/06/13 1222 10/07/13 0554  10/08/13 0636  NA 134* 134*  --  135* 132*  K 2.6* 2.7*  --  5.1 4.9  CL 81* 86*  --  97 93*  CO2 24 22  --  23 25  GLUCOSE 102* 81  --  102* 110*  BUN 24* 24*  --  29* 34*  CREATININE 3.11* 3.10*  --  2.79* 1.99*  CALCIUM 8.6 7.9*  --  8.3* 8.5  MG  --  1.5  --   --   --   PHOS  --   --  2.4  --   --    Liver Function Tests:  Recent Labs Lab 10/05/13 2247 10/06/13 0447 10/08/13 0636  AST 179* 160* 144*  ALT 38* 34 43*  ALKPHOS 283* 250* 235*  BILITOT 4.5* 4.2* 4.8*  PROT 7.8 6.9 7.2  ALBUMIN 2.5* 2.2* 2.4*    Recent Labs Lab 10/05/13 2247 10/08/13 0636  LIPASE 179* 188*    Recent Labs Lab 10/05/13 2248  AMMONIA 42   CBC:  Recent Labs Lab 10/05/13 2247 10/06/13 0447 10/07/13 0554 10/08/13 0636  WBC 18.0* 15.5* 14.5* 13.8*  NEUTROABS 14.5*  --   --   --   HGB 9.1* 8.2* 7.7* 7.6*  HCT 27.5* 24.7* 23.4* 23.4*  MCV 115.5* 116.5* 119.4* 118.8*  PLT 151 114* 124* 123*    Studies: No results found.  Scheduled Meds: . folic acid  1 mg Oral Daily  . pantoprazole  40 mg Oral QAC breakfast  . potassium chloride  40 mEq Oral BID  . spironolactone  25 mg Oral Daily

## 2013-10-08 NOTE — Progress Notes (Signed)
Subjective: Interval History:Still complains of weakness . Her appetite is poor but improving. Patient also denies any difficulty in breathing..  Objective: Vital signs in last 24 hours: Temp:  [98 F (36.7 C)-98.5 F (36.9 C)] 98 F (36.7 C) (03/01 0604) Pulse Rate:  [121-124] 121 (03/01 0604) Resp:  [20] 20 (03/01 0604) BP: (115-121)/(59-84) 115/59 mmHg (03/01 0604) SpO2:  [94 %-98 %] 98 % (03/01 0604) Weight:  [85.548 kg (188 lb 9.6 oz)] 85.548 kg (188 lb 9.6 oz) (03/01 0604) Weight change: 1.179 kg (2 lb 9.6 oz)  Intake/Output from previous day: 02/28 0701 - 03/01 0700 In: 1440 [P.O.:1440] Out: 420 [Urine:420] Intake/Output this shift:    General appearance: alert, cooperative and no distress Resp: diminished breath sounds bilaterally Cardio: regular rate and rhythm, S1, S2 normal, no murmur, click, rub or gallop GI: Distended, nontender and no rebound tenderness. Extremities: edema 2+ edema bilaterally.  Lab Results:  Recent Labs  10/07/13 0554 10/08/13 0636  WBC 14.5* 13.8*  HGB 7.7* 7.6*  HCT 23.4* 23.4*  PLT 124* 123*   BMET:   Recent Labs  10/07/13 0554 10/08/13 0636  NA 135* 132*  K 5.1 4.9  CL 97 93*  CO2 23 25  GLUCOSE 102* 110*  BUN 29* 34*  CREATININE 2.79* 1.99*  CALCIUM 8.3* 8.5   No results found for this basename: PTH,  in the last 72 hours Iron Studies: No results found for this basename: IRON, TIBC, TRANSFERRIN, FERRITIN,  in the last 72 hours  Studies/Results: No results found.  I have reviewed the patient's current medications.  Assessment/Plan: Problem #1 acute kidney injury her BUN is 34 and her  creatinine is 1.99. Presently even though her BUN and creatinine is improving. Problem #2 hypokalemia: Potassium is 4.9 . Patient on potassium supplement and also Aldactone. Problem #3 anemia her hemoglobin remains low Problem #4 anasarca: Presently patient with poor urine output. And her anasarca seems to be worsening. Patient on  lasix and her urine out put is better but still with significant signs of fluid over load Problem #5 liver cirrhosis  Problem #6 metabolic bone disease: Calcium and phosphorus in range Problem #7 possible UTI. Patient on antibiotics. Her white blood cell count is high but improving. Plan: DC potassium supplement Will increase lasix to 160 mg iv bid We'll check her basic metabolic panel and CBC in the morning.   LOS: 3 days   Altagracia Rone S 10/08/2013,8:56 AM   is 1

## 2013-10-08 NOTE — Progress Notes (Signed)
Patient complains of being weak and "sore all over". She has yet to make up her mind whether or not she wants a liver biopsy She again denies alcohol intake of any sort. She denies over-the-counter vitamin herbal remedies beyond a one day vitamin. Alpha-1 anti-trypsin level elevated-not typically associated with liver disease 24 hour urine for copper determination remains pending. Celiac screen remains pending.  Minimally elevated lipase. Progressive anemia with a hemoglobin in the 7 range.  Vital signs in last 24 hours: Temp:  [98 F (36.7 C)-98.5 F (36.9 C)] 98 F (36.7 C) (03/01 0604) Pulse Rate:  [121-124] 121 (03/01 0604) Resp:  [20] 20 (03/01 0604) BP: (115-121)/(59-84) 115/59 mmHg (03/01 0604) SpO2:  [94 %-98 %] 98 % (03/01 0604) Weight:  [188 lb 9.6 oz (85.548 kg)] 188 lb 9.6 oz (85.548 kg) (03/01 0604) Last BM Date: 09/30/13 General:   Slightly lethargic. She has no asterixis. She appears to be in no acute distress. Abdomen:  Distended. Positive bowel sounds. Some shifting dullness and fluid wave. Minimal diffuse tenderness. Extremities:  Without clubbing or edema.    Intake/Output from previous day: 02/28 0701 - 03/01 0700 In: 1440 [P.O.:1440] Out: 420 [Urine:420] Intake/Output this shift: Total I/O In: 240 [P.O.:240] Out: -   Lab Results:  Recent Labs  10/06/13 0447 10/07/13 0554 10/08/13 0636  WBC 15.5* 14.5* 13.8*  HGB 8.2* 7.7* 7.6*  HCT 24.7* 23.4* 23.4*  PLT 114* 124* 123*   BMET  Recent Labs  10/06/13 0447 10/07/13 0554 10/08/13 0636  NA 134* 135* 132*  K 2.7* 5.1 4.9  CL 86* 97 93*  CO2 22 23 25   GLUCOSE 81 102* 110*  BUN 24* 29* 34*  CREATININE 3.10* 2.79* 1.99*  CALCIUM 7.9* 8.3* 8.5   LFT  Recent Labs  10/08/13 0636  PROT 7.2  ALBUMIN 2.4*  AST 144*  ALT 43*  ALKPHOS 235*  BILITOT 4.8*   PT/INR  Recent Labs  10/06/13  LABPROT 15.5*  INR 1.26   Hepatitis Panel  Recent Labs  10/06/13 1120  HEPBSAG NEGATIVE  HCVAB  NEGATIVE    Impression: Renal failure modestly improved. Bilirubin and aminotransferase somewhat more elevated. Liver biopsy needed to help sort out. Had a lengthy discussion about pros and cons of liver biopsy along with the known risks and benefits. Not having a liver biopsy may delay making a significant diagnosis which could negatively impact on her health. I believe she understands the situation.  Very mildly elevated lipase. This is nonspecific. I doubt pancreatitis. Liver biopsy in [place] remains scheduled for tomorrow. Recommendations: Hopefully, liver biopsy tomorrow. Followup on urine copper determination, celiac screen. Follow hemoglobin; she may end up with a transfusion.

## 2013-10-09 DIAGNOSIS — R17 Unspecified jaundice: Secondary | ICD-10-CM

## 2013-10-09 DIAGNOSIS — D649 Anemia, unspecified: Secondary | ICD-10-CM

## 2013-10-09 DIAGNOSIS — R7989 Other specified abnormal findings of blood chemistry: Secondary | ICD-10-CM

## 2013-10-09 LAB — URINE CULTURE
CULTURE: NO GROWTH
Colony Count: NO GROWTH

## 2013-10-09 LAB — COMPLEMENT, TOTAL: Compl, Total (CH50): >60 U/mL — ABNORMAL HIGH (ref 31–60)

## 2013-10-09 LAB — CBC
HCT: 22.2 % — ABNORMAL LOW (ref 36.0–46.0)
Hemoglobin: 7.2 g/dL — ABNORMAL LOW (ref 12.0–15.0)
MCH: 38.5 pg — ABNORMAL HIGH (ref 26.0–34.0)
MCHC: 32.4 g/dL (ref 30.0–36.0)
MCV: 118.7 fL — ABNORMAL HIGH (ref 78.0–100.0)
Platelets: 122 10*3/uL — ABNORMAL LOW (ref 150–400)
RBC: 1.87 MIL/uL — AB (ref 3.87–5.11)
RDW: 18.7 % — ABNORMAL HIGH (ref 11.5–15.5)
WBC: 11.6 10*3/uL — AB (ref 4.0–10.5)

## 2013-10-09 LAB — BASIC METABOLIC PANEL
BUN: 35 mg/dL — ABNORMAL HIGH (ref 6–23)
CALCIUM: 8.7 mg/dL (ref 8.4–10.5)
CO2: 27 meq/L (ref 19–32)
Chloride: 95 mEq/L — ABNORMAL LOW (ref 96–112)
Creatinine, Ser: 1.56 mg/dL — ABNORMAL HIGH (ref 0.50–1.10)
GFR calc Af Amer: 47 mL/min — ABNORMAL LOW (ref >90)
GFR, EST NON AFRICAN AMERICAN: 41 mL/min — AB (ref >90)
Glucose, Bld: 105 mg/dL — ABNORMAL HIGH (ref 70–99)
Potassium: 4.1 mEq/L (ref 3.7–5.3)
SODIUM: 136 meq/L — AB (ref 137–147)

## 2013-10-09 LAB — TISSUE TRANSGLUTAMINASE, IGA: Tissue Transglutaminase Ab, IgA: 12.7 U/mL (ref <20)

## 2013-10-09 LAB — LIPASE, BLOOD: LIPASE: 178 U/L — AB (ref 11–59)

## 2013-10-09 MED ORDER — FUROSEMIDE 10 MG/ML IJ SOLN
200.0000 mg | Freq: Two times a day (BID) | INTRAMUSCULAR | Status: DC
  Administered 2013-10-09 – 2013-10-12 (×6): 200 mg via INTRAVENOUS
  Filled 2013-10-09 (×6): qty 20

## 2013-10-09 MED ORDER — METOLAZONE 5 MG PO TABS
5.0000 mg | ORAL_TABLET | Freq: Two times a day (BID) | ORAL | Status: DC
  Administered 2013-10-09 – 2013-10-12 (×8): 5 mg via ORAL
  Filled 2013-10-09 (×9): qty 1

## 2013-10-09 NOTE — Care Management Note (Addendum)
    Page 1 of 1   10/13/2013     11:10:44 AM   CARE MANAGEMENT NOTE 10/13/2013  Patient:  Wanda [name], Wanda [name]   Account Number:  [id number]  Date Initiated:  10/09/2013  Documentation initiated by:  Theophilus Kinds  Subjective/Objective Assessment:   Pt admitted from home with ARF and UTI. Pt lives with her boyfriend and will return home at discharge. Pt is fairly independent with ADL's but boyfriend assist as well when needed. Pt states that she has been pt at [facility]     Action/Plan:   Will continue to monitor for discharge planning needs.   Anticipated DC Date:  10/12/2013   Anticipated DC Plan:  [place]  CM consult      Choice offered to / List presented to:             Status of service:  Completed, signed off Medicare Important Message given?  YES (If response is "NO", the following Medicare IM given date fields will be blank) Date Medicare IM given:  10/13/2013 Date Additional Medicare IM given:    Discharge Disposition:  HOME/SELF CARE  Per UR Regulation:    If discussed at Long Length of Stay Meetings, dates discussed:   10/10/2013  10/12/2013    Comments:  10/13/13 1110 Christinia Gully, RN BSN CM Pt to be discharged home today. PCP followup to be arranged and documented on AVS. No other CM needs noted.  10/09/13 [place], RN BSN CM

## 2013-10-09 NOTE — Progress Notes (Addendum)
Patient seen, independently examined and chart reviewed. I agree with exam, assessment and plan discussed with Dyanne Carrel, NP. Because of Epic software malfunction I am unable to sign progress note 3/2. This note serves as my Optician, dispensing.  38 year old woman with liver disease of unclear etiology with associated jaundice and ascites. Presented with generalized weakness and vague symptomology. She was admitted for unspecified liver disease with ascites and jaundice, acute renal failure, severe hypokalemia.   Nephrology and GI following.  PMH GERD Liver disease  Subjective: Patient refused liver biopsy. She does not offer reason except "we just need to give this time to get better".  Objective: Afebrile, sinus tachycardia noted. Appears calm and comfortable. Speech is fluent and clear. Cardiovascular regular rate and rhythm. Respiratory clear to auscultation bilaterally. No wheezes, rales or rhonchi. Abdomen is distended, massive bilateral lower extremity edema noted. Respiratory status does appear to be stable.  Creatinine is slowly trending back towards normal, 1.56 today. Hgb 7.2, similar to previous values.  Complex case, 38 year old with unexplained liver failure and renal failure.  Acute renal failure. Improving. Management is deferred to nephrology.  Liver disease. Per GI workup has been negative for cause. Patient refused liver biopsy but does not offer reason. Further workup and recommendations deferred to GI.  Anemia. Slightly trending downwards. May need to consider transfusion. Further workup per gastroenterology.  Thrombocytopenia.  End point unclear, patient has not pursued outpatient followup.  Murray Hodgkins, MD [facility] [phone]

## 2013-10-09 NOTE — Progress Notes (Signed)
    Subjective: Refusing liver biopsy. "don't you have enough information to figure out what is going on? You all are smart". States it is "scary" but not the main reason for refusing liver biopsy. Asking about fibromyalgia. Thinks a liver biopsy is not in her best interest right now. Despite multiple attempts at questioning, she is not volunteering any further information to support her decision to decline the procedure.   Objective: Vital signs in last 24 hours: Temp:  [97.4 F (36.3 C)-97.5 F (36.4 C)] 97.4 F (36.3 C) (03/02 0422) Pulse Rate:  [118-121] 119 (03/02 0422) Resp:  [20] 20 (03/02 0422) BP: (104-129)/(56-67) 129/56 mmHg (03/02 0422) SpO2:  [99 %-100 %] 99 % (03/02 0422) Weight:  [187 lb 9.6 oz (85.095 kg)] 187 lb 9.6 oz (85.095 kg) (03/02 0529) Last BM Date: 10/08/13 General:   Alert and oriented X 4, flat affect. Appears depressed.  Head:  Normocephalic and atraumatic. Eyes:  +icterus, left eye with reddened sclera. ? Small burst blood vessel Heart:  S1, S2 present, no murmurs noted.  Lungs: Clear to auscultation bilaterally, without wheezing, rales, or rhonchi.  Abdomen:  Bowel sounds present, non-tense ascites, obese. Non-tender. Fluid wave Extremities:  2-3+ lower extremity edema Neurologic:  Alert and  oriented x4;  grossly normal neurologically. No asterixis.  Psych:  Alert. Flat affect. Short with responses, does not volunteer additional information.   Intake/Output from previous day: 03/01 0701 - 03/02 0700 In: 2020 [P.O.:600; I.V.:1200; IV Piggyback:220] Out: 950 [Urine:950] Intake/Output this shift:    Lab Results:  Recent Labs  10/07/13 0554 10/08/13 0636 10/09/13 0553  WBC 14.5* 13.8* 11.6*  HGB 7.7* 7.6* 7.2*  HCT 23.4* 23.4* 22.2*  PLT 124* 123* 122*   BMET  Recent Labs  10/07/13 0554 10/08/13 0636 10/09/13 0553  NA 135* 132* 136*  K 5.1 4.9 4.1  CL 97 93* 95*  CO2 23 25 27   GLUCOSE 102* 110* 105*  BUN 29* 34* 35*  CREATININE  2.79* 1.99* 1.56*  CALCIUM 8.3* 8.5 8.7   LFT  Recent Labs  10/08/13 0636  PROT 7.2  ALBUMIN 2.4*  AST 144*  ALT 43*  ALKPHOS 235*  BILITOT 4.8*   Hepatitis Panel  Recent Labs  10/06/13 1120  HEPBSAG NEGATIVE  HCVAB NEGATIVE    Assessment: 38 year old female with elevated LFTs, acute renal failure, extensive previous work-up recently without definite etiology; 24 hour urine for copper pending, celiac screen pending. Was scheduled for liver biopsy but REFUSING despite a lengthy (at least 20 minute) conversation regarding the grim, poor prognosis without further work-up of liver disease. She is unable to give a clear reason for refusing this. Liver biopsy has been cancelled.    Appears weight was 178 on admission and 187 now, which it has been trending for past few days. Generalized anasarca and non-tense ascites appreciated. Diuretics managed by Nephrology.    Elevated lipase: doubt pancreatitis, especially in setting of acute renal failure.   Anemia: persistent without overt signs of GI bleeding.   Plan: Patient refusing liver biopsy; cancelled for today Diuretic management per Nephrology Follow-up on pending urine copper, celiac serology Follow H/H; may need transfusion if continues to drop Continue PPI daily Will restart 2gram sodium diet with fluid restriction Consider social work consult/psych consult: will defer to hospitalist.    Orvil Feil, ANP-BC [facility]    LOS: 4 days    10/09/2013, 7:49 AM

## 2013-10-09 NOTE — Progress Notes (Signed)
Subjective: Interval History:Still complains of weakness . Denies any nausea or vomiting. Patient also denies any difficulty in breathing..  Objective: Vital signs in last 24 hours: Temp:  [97.4 F (36.3 C)-97.5 F (36.4 C)] 97.4 F (36.3 C) (03/02 0422) Pulse Rate:  [118-121] 119 (03/02 0422) Resp:  [20] 20 (03/02 0422) BP: (104-129)/(56-67) 129/56 mmHg (03/02 0422) SpO2:  [99 %-100 %] 99 % (03/02 0422) Weight:  [85.095 kg (187 lb 9.6 oz)] 85.095 kg (187 lb 9.6 oz) (03/02 0529) Weight change: -0.454 kg (-1 lb)  Intake/Output from previous day: 03/01 0701 - 03/02 0700 In: 2020 [P.O.:600; I.V.:1200; IV Piggyback:220] Out: 950 [Urine:950] Intake/Output this shift:    General appearance: alert, cooperative and no distress Resp: diminished breath sounds bilaterally Cardio: regular rate and rhythm, S1, S2 normal, no murmur, click, rub or gallop GI: Distended, nontender and no rebound tenderness. Extremities: edema 2+ edema bilaterally.  Lab Results:  Recent Labs  10/08/13 0636 10/09/13 0553  WBC 13.8* 11.6*  HGB 7.6* 7.2*  HCT 23.4* 22.2*  PLT 123* 122*   BMET:   Recent Labs  10/08/13 0636 10/09/13 0553  NA 132* 136*  K 4.9 4.1  CL 93* 95*  CO2 25 27  GLUCOSE 110* 105*  BUN 34* 35*  CREATININE 1.99* 1.56*  CALCIUM 8.5 8.7   No results found for this basename: PTH,  in the last 72 hours Iron Studies:   Recent Labs  10/08/13 0636  IRON 116  TIBC NOT CALC  FERRITIN 895*    Studies/Results: No results found.  I have reviewed the patient's current medications.  Assessment/Plan: Problem #1 acute kidney injury her BUN is 35 and her  creatinine is 1.56. Presently even though her BUN and creatinine is improving. Problem #2 hypokalemia: Potassium is 4.1 . Patient on potassium supplement and also Aldactone. Problem #3 anemia her hemoglobin remains low Problem #4 anasarca: Presently patient with poor urine output. And her anasarca seems to be worsening.  Patient on lasix and her urine out put is better but still with significant signs of fluid over load Problem #5 liver cirrhosis  Problem #6 metabolic bone disease: Calcium and phosphorus in range Problem #7 possible UTI. Patient on antibiotics. Her white blood cell count is high but improving. Plan:  Will increase lasix to 200 mg iv bid Metolazone 5 mg po bid We'll check her basic metabolic panel  in the morning.   LOS: 4 days   Wanda [name] S 10/09/2013,8:35 AM   is 1

## 2013-10-09 NOTE — Progress Notes (Signed)
55 Went in to talk with the patient about plans for the scheduled transfer to [facility] for the Liver Biopsy today.  She stated that she was not going for the procedure.  I voiced to the risk of not being able to effectively treat her without proper assessment of the test.  She still refused to go.  I voiced to her that I would be notifying the MD and cancelling the procedure.  She verbalized understanding and stated it was fine because she does not want to have the procedure done.  62 Was notified for report by Rodman Key of [facility].  I voiced to him that the night nurse and I both discussed the transport plan to go to [facility] for the procedure and on both occassions she is refusing to go.  Mathew Verbalized understanding.  [place] with Corrine Morgan-[facility] about the patient refusing to come over to the facility for the procedure.  She verbalized understanding.  I voiced to her I would notify the MD here.  She verbalized understanding.  0800 Notified Buena Irish Sams- GI PA of the patient refusing to come over to the facility for the procedure. She verbalized understanding.  0820 Patient wants to eat since she is not going for the procedure.  Discussed this with Dyanne Carrel, NP she states she wants to keep her NPO for now.

## 2013-10-09 NOTE — Progress Notes (Signed)
IR aware of request for liver biopsy, patient was scheduled for procedure at [facility] 10/09/13 but refused to have procedure done. Procedure cancelled.  Tsosie Billing PA-C Interventional Radiology  10/09/13  2:08 PM

## 2013-10-09 NOTE — Progress Notes (Signed)
[facility] PROGRESS NOTE  Wanda [name] MOQ:[id number] DOB: [birth year] DOA: 10/05/2013 PCP: No primary provider on file.  Assessment/Plan: ARF (acute renal failure)  - unclear etiology at this time, pre renal vs ATN concern for hepatorenal syndrome - Cr continues to  trend down today at 1.5.  - appreciate nephrology assistance  - pt currently on spironolactone, Lasix increase to 200 BID.  - continue to monitor intake and output.  -Decrease urine out put.  UTI  -UA and pt's symptoms suggestive of UTI  -Continue with Rocephin day 3.  -Follow urine culture  Hypokalemia  - Resolved.  Transaminitis with jaundice and ascites  - unclear etiology at this time  - current work up unrevealing, including unremarkable hepatitis panel, complement levels WNL  - anti smooth muscle ab, anti mitochondrial ab WNL, ANA negative. Await 24 hour urine copper pending, celiac screen pending.  - A1A (alpha 1 antitrypsin) elevated  - plan for liver transjugular liver biopsy with hepatic vein wedge pressures through interventional radiology but patient refused.   - appreciate GI input  - LFT's stable., continue to monitor  - lipase remains elevated but stable 178.  -Started on lactulose.  -ammonia level 53 10/08/13.  Leukocytosis, unspecified  - most likely secondary to UTI, WBC trending down slowly  - UA suggestive UTI. Rocephin day #2  - await urine culture as noted above  Anemia, acute on chronic blood loss with macrocytosis  - Hg on admission 9.1 --> 7.2 this AM  - no signs of active bleeding  - hold off on transfusion at this time, transfuse ig Hg < 7.0  - repeat CBC in AM  Sinus tachycardia  - continue to monitor on telemetry  - no complaints of chest pain  Thrombocytopenia  - overall remains stable  - no signs of active bleeding  - repeat CBC in AM   Code Status: full Family Communication: none available Disposition Plan: home when clinically ready   Consultants:  Dr. Lowanda Foster  nephrology  Dr. Gala Romney GI  Procedures:  none  Antibiotics:  Rocephin 10/07/13>>  HPI/Subjective: Sitting on side of bed. Denies pain but complains of weakness  Objective: Filed Vitals:   10/09/13 0422  BP: 129/56  Pulse: 119  Temp: 97.4 F (36.3 C)  Resp: 20    Intake/Output Summary (Last 24 hours) at 10/09/13 1209 Last data filed at 10/09/13 1037  Gross per 24 hour  Intake   1620 ml  Output    950 ml  Net    670 ml   Filed Weights   10/07/13 0551 10/08/13 0604 10/09/13 0529  Weight: 84.369 kg (186 lb) 85.548 kg (188 lb 9.6 oz) 85.095 kg (187 lb 9.6 oz)    Exam:   General:  Well nourished NAD  Cardiovascular: tachycardic but regular, no m/g/r 2+LE edema bilaterally to knees  Respiratory: normal effort BS clear bilaterally no wheeze no rhonchi  Abdomen: obese, +fluid wave, +BS mild tenderness to palpation  Musculoskeletal: steady gait  Data Reviewed: Basic Metabolic Panel:  Recent Labs Lab 10/05/13 2247 10/06/13 0447 10/06/13 1222 10/07/13 0554 10/08/13 0636 10/09/13 0553  NA 134* 134*  --  135* 132* 136*  K 2.6* 2.7*  --  5.1 4.9 4.1  CL 81* 86*  --  97 93* 95*  CO2 24 22  --  23 25 27   GLUCOSE 102* 81  --  102* 110* 105*  BUN 24* 24*  --  29* 34* 35*  CREATININE 3.11* 3.10*  --  2.79* 1.99* 1.56*  CALCIUM 8.6 7.9*  --  8.3* 8.5 8.7  MG  --  1.5  --   --   --   --   PHOS  --   --  2.4  --   --   --    Liver Function Tests:  Recent Labs Lab 10/05/13 2247 10/06/13 0447 10/08/13 0636  AST 179* 160* 144*  ALT 38* 34 43*  ALKPHOS 283* 250* 235*  BILITOT 4.5* 4.2* 4.8*  PROT 7.8 6.9 7.2  ALBUMIN 2.5* 2.2* 2.4*    Recent Labs Lab 10/05/13 2247 10/08/13 0636 10/09/13 0553  LIPASE 179* 188* 178*    Recent Labs Lab 10/05/13 2248 10/08/13 1147  AMMONIA 42 53   CBC:  Recent Labs Lab 10/05/13 2247 10/06/13 0447 10/07/13 0554 10/08/13 0636 10/09/13 0553  WBC 18.0* 15.5* 14.5* 13.8* 11.6*  NEUTROABS 14.5*  --   --   --   --    HGB 9.1* 8.2* 7.7* 7.6* 7.2*  HCT 27.5* 24.7* 23.4* 23.4* 22.2*  MCV 115.5* 116.5* 119.4* 118.8* 118.7*  PLT 151 114* 124* 123* 122*   Cardiac Enzymes: No results found for this basename: CKTOTAL, CKMB, CKMBINDEX, TROPONINI,  in the last 168 hours BNP (last 3 results) No results found for this basename: PROBNP,  in the last 8760 hours CBG: No results found for this basename: GLUCAP,  in the last 168 hours  No results found for this or any previous visit (from the past 240 hour(s)).   Studies: No results found.  Scheduled Meds: . cefTRIAXone (ROCEPHIN)  IV  1 g Intravenous Q24H  . folic acid  1 mg Oral Daily  . furosemide  200 mg Intravenous BID  . lactulose  10 g Oral Daily  . metolazone  5 mg Oral BID  . pantoprazole  40 mg Oral QAC breakfast  . sodium chloride  3 mL Intravenous Q12H  . spironolactone  25 mg Oral Daily   Continuous Infusions:   Principal Problem:   ARF (acute renal failure) Active Problems:   Jaundice   Ascites   Hypokalemia   Leukocytosis, unspecified   Generalized weakness   Transaminitis   Acute renal failure   Anemia   Sinus tachycardia    Time spent: 30 minutes    [facility] Pager [phone]. If 7PM-7AM, please contact night-coverage at [email or web address], password [facility] 10/09/2013, 12:09 PM  LOS: 4 days

## 2013-10-09 NOTE — Progress Notes (Signed)
Pt. Was requesting something to drink.  Educated patient on why she was NPO and patient stated that she did not want to have the liver biopsy done.  Patient also stated that she was in 10/10 pain and requested Percocet.  Gave Percocet with sip of water.

## 2013-10-10 DIAGNOSIS — R601 Generalized edema: Secondary | ICD-10-CM

## 2013-10-10 HISTORY — DX: Generalized edema: R60.1

## 2013-10-10 LAB — UIFE/LIGHT CHAINS/TP QN, 24-HR UR
ALPHA 2 UR: DETECTED — AB
Albumin, U: DETECTED
Alpha 1, Urine: DETECTED — AB
Beta, Urine: DETECTED — AB
FREE KAPPA/LAMBDA RATIO: 6.01 ratio (ref 2.04–10.37)
Free Kappa Lt Chains,Ur: 45.5 mg/dL — ABNORMAL HIGH (ref 0.14–2.42)
Free Lambda Lt Chains,Ur: 7.57 mg/dL — ABNORMAL HIGH (ref 0.02–0.67)
Gamma Globulin, Urine: DETECTED — AB
TOTAL PROTEIN, URINE-UPE24: 76.6 mg/dL

## 2013-10-10 LAB — BASIC METABOLIC PANEL
BUN: 33 mg/dL — ABNORMAL HIGH (ref 6–23)
CO2: 28 mEq/L (ref 19–32)
Calcium: 8.8 mg/dL (ref 8.4–10.5)
Chloride: 94 mEq/L — ABNORMAL LOW (ref 96–112)
Creatinine, Ser: 1.36 mg/dL — ABNORMAL HIGH (ref 0.50–1.10)
GFR calc non Af Amer: 48 mL/min — ABNORMAL LOW (ref >90)
GFR, EST AFRICAN AMERICAN: 56 mL/min — AB (ref >90)
Glucose, Bld: 102 mg/dL — ABNORMAL HIGH (ref 70–99)
POTASSIUM: 3.3 meq/L — AB (ref 3.7–5.3)
SODIUM: 137 meq/L (ref 137–147)

## 2013-10-10 LAB — HEPATIC FUNCTION PANEL
ALT: 38 U/L — ABNORMAL HIGH (ref 0–35)
AST: 113 U/L — AB (ref 0–37)
Albumin: 2.3 g/dL — ABNORMAL LOW (ref 3.5–5.2)
Alkaline Phosphatase: 204 U/L — ABNORMAL HIGH (ref 39–117)
BILIRUBIN TOTAL: 3.9 mg/dL — AB (ref 0.3–1.2)
Bilirubin, Direct: 2.5 mg/dL — ABNORMAL HIGH (ref 0.0–0.3)
Indirect Bilirubin: 1.4 mg/dL — ABNORMAL HIGH (ref 0.3–0.9)
Total Protein: 7 g/dL (ref 6.0–8.3)

## 2013-10-10 LAB — URINALYSIS, ROUTINE W REFLEX MICROSCOPIC
BILIRUBIN URINE: NEGATIVE
Glucose, UA: NEGATIVE mg/dL
KETONES UR: NEGATIVE mg/dL
Nitrite: NEGATIVE
PH: 5 (ref 5.0–8.0)
Protein, ur: NEGATIVE mg/dL
Specific Gravity, Urine: 1.015 (ref 1.005–1.030)
Urobilinogen, UA: 0.2 mg/dL (ref 0.0–1.0)

## 2013-10-10 LAB — CBC
HEMATOCRIT: 22.1 % — AB (ref 36.0–46.0)
HEMOGLOBIN: 7.3 g/dL — AB (ref 12.0–15.0)
MCH: 38.6 pg — ABNORMAL HIGH (ref 26.0–34.0)
MCHC: 33 g/dL (ref 30.0–36.0)
MCV: 116.9 fL — ABNORMAL HIGH (ref 78.0–100.0)
Platelets: 150 10*3/uL (ref 150–400)
RBC: 1.89 MIL/uL — ABNORMAL LOW (ref 3.87–5.11)
RDW: 18.7 % — AB (ref 11.5–15.5)
WBC: 11.3 10*3/uL — AB (ref 4.0–10.5)

## 2013-10-10 LAB — URINE MICROSCOPIC-ADD ON

## 2013-10-10 MED ORDER — ZOLPIDEM TARTRATE 5 MG PO TABS
5.0000 mg | ORAL_TABLET | Freq: Once | ORAL | Status: AC
  Administered 2013-10-11: 5 mg via ORAL
  Filled 2013-10-10: qty 1

## 2013-10-10 MED ORDER — POTASSIUM CHLORIDE CRYS ER 20 MEQ PO TBCR
40.0000 meq | EXTENDED_RELEASE_TABLET | ORAL | Status: AC
  Administered 2013-10-10 (×2): 40 meq via ORAL
  Filled 2013-10-10 (×2): qty 2

## 2013-10-10 MED ORDER — LACTULOSE 10 GM/15ML PO SOLN
10.0000 g | Freq: Two times a day (BID) | ORAL | Status: DC
  Administered 2013-10-10 – 2013-10-12 (×4): 10 g via ORAL
  Filled 2013-10-10 (×4): qty 30

## 2013-10-10 MED ORDER — OXYCODONE HCL 5 MG PO TABS
5.0000 mg | ORAL_TABLET | ORAL | Status: DC | PRN
  Administered 2013-10-10 – 2013-10-13 (×10): 5 mg via ORAL
  Filled 2013-10-10 (×10): qty 1

## 2013-10-10 MED ORDER — ENSURE COMPLETE PO LIQD
237.0000 mL | Freq: Two times a day (BID) | ORAL | Status: DC
  Administered 2013-10-10 – 2013-10-13 (×7): 237 mL via ORAL

## 2013-10-10 NOTE — Progress Notes (Signed)
   I agree with the History/assesment & plan per Midlevel provider as per above Further details as follows:-         38 y/o ?, prior bland history, status post admission 2/2 2015-2/4 2015 for painless jaundice, profound hepatomegaly fatty liver-paracentesis performed on that admission indicating portal hypertension. Further workup for primary biliary cirrhosis, autoimmune hepatitis, Wilson's disease was performed she missed outpatient followup appointment 09/20/13 and was readmitted 10/05/13 with acute kidney injury.  She states she noticed a decrease in her urine output over the course of time from when she was discharged.  She was admitted with acute kidney injury, BUN 24/3.11, potassium 2.6, alkaline phosphatase 283, lipase 179, AST 179, ALT 38, total bilirubin 4.5, ammonia 42    Patient doing fair.  No n/v/cp-no abdominal pain however is still massively swollen and is not hungry because of the abdominal swelling  current output so far for today -1.4 L on high doses of Lasix as per nephrology  Anemia indices confirm potential bimodal macrocytic and microcytic anemia probably from liver dysfunction and poor nutritional state.  Labs as of today show BUN 33/creatinine 1.3--lipase done 3/2 still holding steady 178, AST 113, ALT 38, total bilirubin 3.9,  direct fraction 2.5, indirect 1.4, albumin 2.3 -labs including chem 12, CBC, and now will be repeated in the morning  Discussed patient's care with nephrology Dr. 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  I have ordered another urinalysis to determine if she still is spilling large amount of protein in her urine . Potentially she will need discussion between nephrology and gastroenterology regarding either steroids vs. immunomodulators and we might need to involve hematology in this discussion    Unfortunately patient  is Still vacillating between liver biopsy/even simple paracentesis, and hematology consult may not offer much without  tissue biopsy   I suspect patient will need to be here at least until her oliguric phase is completely resolved and she passes a lot more urine.  Have ordered lactulose 15 g twice a day until diarrhea, her abdomen is 2.3 and my discussion with nephrology was to replace this if it is below 2.0 we will consider an abdominal fat biopsy and a 24-hour urine depending on further diuresis and results from tomorrow. I greatly appreciate nephrology and hepatology's input into her care   Verneita Griffes, MD [facility] [phone]

## 2013-10-10 NOTE — Progress Notes (Signed)
INITIAL NUTRITION ASSESSMENT  DOCUMENTATION CODES Per approved criteria  -Obesity Unspecified   INTERVENTION: Ensure Complete po BID, each supplement provides 350 kcal and 13 grams of protein  NUTRITION DIAGNOSIS: Inadequate oral intake related to decreased appetite as evidenced by PO: 0-50%.   Goal: Pt will meet >90% of estimated nutritional needs  Monitor:  PO intake, labs, weight changes, skin assessments, I/O's  Reason for Assessment: LOS, poor po intake  38 y.o. female  Admitting Dx: ARF (acute renal failure)  ASSESSMENT: Pt admitted for ARF, jaundice, and ascites. Noted recent discharged from [facility] on 09/13/13, with close outpatient follow-up, however, noted no-show to outpatient appointment.  Appetite has been poor. No n/v. PO: 0-50%. Wt hx reveals stable wt.  Per MD notes, pt continues to refuse liver biopsy.  Pt somnolent during visit. Unable to participate in interview or exam at this time.  Pt does not meet criteria for malnutrition, however, is at risk for malnutrition given poor po intake and increased nutritional needs due to liver disease.   Height: Ht Readings from Last 1 Encounters:  10/06/13 5\' 2"  (1.575 m)    Weight: Wt Readings from Last 1 Encounters:  10/10/13 177 lb 1.6 oz (80.332 kg)    Ideal Body Weight: 110#  % Ideal Body Weight: 161%  Wt Readings from Last 10 Encounters:  10/10/13 177 lb 1.6 oz (80.332 kg)  09/11/13 177 lb 11.2 oz (80.604 kg)    Usual Body Weight: 171#  % Usual Body Weight: 100%  BMI:  Body mass index is 32.38 kg/(m^2). Meets criteria for obesity, class I.  Estimated Nutritional Needs: Kcal: 2290-2674 daily Protein: 120-161 grams daily Fluid: 2.3-2.7 L daily  Skin: Intact  Diet Order: Sodium Restricted  EDUCATION NEEDS: -Education not appropriate at this time   Intake/Output Summary (Last 24 hours) at 10/10/13 1542 Last data filed at 10/10/13 1237  Gross per 24 hour  Intake    843 ml  Output   3475 ml   Net  -2632 ml    Last BM: 10/09/13  Labs:   Recent Labs Lab 10/06/13 0447 10/06/13 1222  10/08/13 0636 10/09/13 0553 10/10/13 0508  NA 134*  --   < > 132* 136* 137  K 2.7*  --   < > 4.9 4.1 3.3*  CL 86*  --   < > 93* 95* 94*  CO2 22  --   < > 25 27 28   BUN 24*  --   < > 34* 35* 33*  CREATININE 3.10*  --   < > 1.99* 1.56* 1.36*  CALCIUM 7.9*  --   < > 8.5 8.7 8.8  MG 1.5  --   --   --   --   --   PHOS  --  2.4  --   --   --   --   GLUCOSE 81  --   < > 110* 105* 102*  < > = values in this interval not displayed.  CBG (last 3)  No results found for this basename: GLUCAP,  in the last 72 hours  Scheduled Meds: . folic acid  1 mg Oral Daily  . furosemide  200 mg Intravenous BID  . lactulose  10 g Oral Daily  . metolazone  5 mg Oral BID  . pantoprazole  40 mg Oral QAC breakfast  . sodium chloride  3 mL Intravenous Q12H  . spironolactone  25 mg Oral Daily    Continuous Infusions:   Past Medical History  Diagnosis Date  . GERD (gastroesophageal reflux disease)   . Liver disease   . Acute renal failure     Past Surgical History  Procedure Laterality Date  . None      Sonita Michiels A. Jimmye Norman, RD, LDN Pager: [phone]

## 2013-10-10 NOTE — Progress Notes (Signed)
    Subjective: Denies abdominal pain, N/V. Continues to refuse liver biopsy.   Objective: Vital signs in last 24 hours: Temp:  [97.8 F (36.6 C)-98.4 F (36.9 C)] 97.8 F (36.6 C) (03/03 0500) Pulse Rate:  [114-121] 114 (03/03 0500) Resp:  [20] 20 (03/03 0500) BP: (93-121)/(56-83) 93/56 mmHg (03/03 0500) SpO2:  [92 %-100 %] 100 % (03/03 0500) Weight:  [177 lb 1.6 oz (80.332 kg)] 177 lb 1.6 oz (80.332 kg) (03/03 0500) Last BM Date: 10/08/13 General:   Alert and oriented, flat affect.  Eyes:  +scleral icterus Heart:  S1, S2 present, no murmurs noted.  Lungs: Clear to auscultation bilaterally, without wheezing, rales, or rhonchi.  Abdomen:  Bowel sounds present, obese, +fluid wave, non-tense ascites. Non-tender Extremities:  3+ lower extremity pitting edema Neurologic:  Alert and  oriented x4;  grossly normal neurologically.   Intake/Output from previous day: 03/02 0701 - 03/03 0700 In: 905 [P.O.:360; I.V.:3; IV Piggyback:182] Out: 2475 [Urine:2475] Intake/Output this shift:    Lab Results:  Recent Labs  10/08/13 0636 10/09/13 0553 10/10/13 0508  WBC 13.8* 11.6* 11.3*  HGB 7.6* 7.2* 7.3*  HCT 23.4* 22.2* 22.1*  PLT 123* 122* 150   BMET  Recent Labs  10/08/13 0636 10/09/13 0553 10/10/13 0508  NA 132* 136* 137  K 4.9 4.1 3.3*  CL 93* 95* 94*  CO2 25 27 28   GLUCOSE 110* 105* 102*  BUN 34* 35* 33*  CREATININE 1.99* 1.56* 1.36*  CALCIUM 8.5 8.7 8.8   LFT  Recent Labs  10/08/13 0636 10/10/13 0817  PROT 7.2 7.0  ALBUMIN 2.4* 2.3*  AST 144* 113*  ALT 43* 38*  ALKPHOS 235* 204*  BILITOT 4.8* 3.9*  BILIDIR  --  2.5*  IBILI  --  1.4*   Transglutaminase IgA assay normal  Assessment: 38 year old female with elevated LFTs, acute renal failure but improving, extensive previous work-up recently without definite etiology; 24 hour urine for copper pending, celiac serology normal. Continues to decline liver biopsy. Etiology of underlying liver disease still  remains unclear; without liver biopsy, limited further options for patient while hospitalized other than supportive care.   Generalized anasarca and non-tense ascites appreciated. Diuretics managed by Nephrology. 10 lbs down from yesterday; responding to diuretic therapy per nephrology.     Plan: Follow-up on pending urine copper PPI daily 2 gram sodium diet with fluid restriction Diuretic management per nephrology Follow H/H; hold on transfusion unless Hgb significantly declines Continue to recommend liver biopsy; other than supportive care, options limited for further investigation of underlying liver disease while inpatient.  Consider social work consult/psych consult: will defer to hospitalist.    Orvil Feil, ANP-BC [facility]    LOS: 5 days    10/10/2013, 9:13 AM

## 2013-10-10 NOTE — Progress Notes (Signed)
Subjective: Interval History:No new complanit . Denies any nausea or vomiting. Patient also denies any difficulty in breathing. Feels some what better  Objective: Vital signs in last 24 hours: Temp:  [97.8 F (36.6 C)-98.4 F (36.9 C)] 97.8 F (36.6 C) (03/03 0500) Pulse Rate:  [114-121] 114 (03/03 0500) Resp:  [20] 20 (03/03 0500) BP: (93-121)/(56-83) 93/56 mmHg (03/03 0500) SpO2:  [92 %-100 %] 100 % (03/03 0500) Weight:  [80.332 kg (177 lb 1.6 oz)] 80.332 kg (177 lb 1.6 oz) (03/03 0500) Weight change: -4.763 kg (-10 lb 8 oz)  Intake/Output from previous day: 03/02 0701 - 03/03 0700 In: 905 [P.O.:360; I.V.:3; IV Piggyback:182] Out: 2475 [Urine:2475] Intake/Output this shift:    General appearance: alert, cooperative and no distress Resp: diminished breath sounds bilaterally Cardio: regular rate and rhythm, S1, S2 normal, no murmur, click, rub or gallop GI: Distended, nontender and no rebound tenderness. Extremities: edema 2+ edema bilaterally.  Lab Results:  Recent Labs  10/09/13 0553 10/10/13 0508  WBC 11.6* 11.3*  HGB 7.2* 7.3*  HCT 22.2* 22.1*  PLT 122* 150   BMET:   Recent Labs  10/09/13 0553 10/10/13 0508  NA 136* 137  K 4.1 3.3*  CL 95* 94*  CO2 27 28  GLUCOSE 105* 102*  BUN 35* 33*  CREATININE 1.56* 1.36*  CALCIUM 8.7 8.8   No results found for this basename: PTH,  in the last 72 hours Iron Studies:   Recent Labs  10/08/13 0636  IRON 116  TIBC NOT CALC  FERRITIN 895*    Studies/Results: No results found.  I have reviewed the patient's current medications.  Assessment/Plan: Problem #1 acute kidney injury her BUN is 33 and her  creatinine is 1.36. Her renal function is progressively getting better Problem #2 hypokalemia: Potassium is 3.3 Her potassium is declining . Patient on potassium supplement and also Aldactone. Problem #3 anemia her hemoglobin remains low Problem #4 anasarca: Presently patient on iv lasix and oral metolazone and  her urine out put has increased significantly  But still patient with anasarca Problem #5 liver cirrhosis  Problem #6 metabolic bone disease: Calcium and phosphorus in range Problem #7 possible UTI. Patient on antibiotics. Her white blood cell count is high but improving. Plan: Continue with potassium supplemnet We'll check her basic metabolic panel  in the morning.   LOS: 5 days   Yaritsa Savarino S 10/10/2013,7:36 AM

## 2013-10-10 NOTE — Progress Notes (Signed)
[facility] PROGRESS NOTE  Wanda [name] OAC:[id number] DOB: 04-14-1976 DOA: 10/05/2013 PCP: No primary provider on file.  38 year old woman with liver disease of unclear etiology with associated jaundice and ascites. Presented with generalized weakness and vague symptomology. She was admitted for unspecified liver disease with ascites and jaundice, acute renal failure, severe hypokalemia.   Assessment/Plan: ARF (acute renal failure)  - unclear etiology. pre renal vs ATN concern for hepatorenal syndrome  - Cr continues to trend down today at 1.3 from 3.11 on admission. Appreciate nephrology assistance. Currently on spironolactone, Lasix increase to 200 BID per renal. Urine output improved over last 24 hours. Continue to monitor intake and output.    UTI  -UA and pt's symptoms suggestive of UTI. Rocephin for 3 days. Will discontinue. Urine culture with no growth.   Hypokalemia  -related to diuresis. Replace and recheck.   Transaminitis with jaundice and ascites  - unclear etiology at this time. Liver function tests improving but remain elevated. Await 24 hour urine copper. Celiac serology within the limits of normal. Anti smooth muscle ab, anti mitochondrial ab WNL, ANA negative. - A1A (alpha 1 antitrypsin) elevated. Continues to refuse liver biopsy. Appreciate GI input.Lipase remains elevated but stable 178. Continue lactulose. ammonia level 53 10/08/13.  Leukocytosis, unspecified  - WBC stable at 11. Chart review indicates chronic and current level near baseline.  She remains afebrile and non-toxic appearing.  trending down slowly. Completed 3 days of rocephin for presumed UTI.   Anemia, acute on chronic blood loss with macrocytosis  - Hg on admission 9.1 --> 7.3 this AM. No signs of active bleeding.Hold off on transfusion at this time. Repeat CBC in AM  Sinus tachycardia  - continue to monitor on telemetry. HR range 114-120. No complaints of chest pain  Thrombocytopenia  - overall  remains stable  - no signs of active bleeding  - repeat CBC in AM    Code Status: full Family Communication: none present Disposition Plan: home when ready hopefully 24-48 hours   Consultants:  GI  Nephrology  Procedures:  none  Antibiotics:  Rocephin 10/07/13-10/09/13  HPI/Subjective: Sitting on side of bed. Denies pain/nausea.  Objective: Filed Vitals:   10/10/13 0500  BP: 93/56  Pulse: 114  Temp: 97.8 F (36.6 C)  Resp: 20    Intake/Output Summary (Last 24 hours) at 10/10/13 1031 Last data filed at 10/10/13 0504  Gross per 24 hour  Intake    773 ml  Output   2475 ml  Net  -1702 ml   Filed Weights   10/08/13 0604 10/09/13 0529 10/10/13 0500  Weight: 85.548 kg (188 lb 9.6 oz) 85.095 kg (187 lb 9.6 oz) 80.332 kg (177 lb 1.6 oz)    Exam:   General:  Well nourished NAD  Cardiovascular: tachycardic but regular, No MGR 2-3+ LE pitting edema to knees  Respiratory: normal effort BS clear bilaterally no wheeze or crackles  Abdomen: Ascites with +fluid wave. Non-tender to palpation. +BS   Musculoskeletal: no clubbing or cyanosis  Data Reviewed: Basic Metabolic Panel:  Recent Labs Lab 10/06/13 0447 10/06/13 1222 10/07/13 0554 10/08/13 0636 10/09/13 0553 10/10/13 0508  NA 134*  --  135* 132* 136* 137  K 2.7*  --  5.1 4.9 4.1 3.3*  CL 86*  --  97 93* 95* 94*  CO2 22  --  23 25 27 28   GLUCOSE 81  --  102* 110* 105* 102*  BUN 24*  --  29* 34* 35* 33*  CREATININE 3.10*  --  2.79* 1.99* 1.56* 1.36*  CALCIUM 7.9*  --  8.3* 8.5 8.7 8.8  MG 1.5  --   --   --   --   --   PHOS  --  2.4  --   --   --   --    Liver Function Tests:  Recent Labs Lab 10/05/13 2247 10/06/13 0447 10/08/13 0636 10/10/13 0817  AST 179* 160* 144* 113*  ALT 38* 34 43* 38*  ALKPHOS 283* 250* 235* 204*  BILITOT 4.5* 4.2* 4.8* 3.9*  PROT 7.8 6.9 7.2 7.0  ALBUMIN 2.5* 2.2* 2.4* 2.3*    Recent Labs Lab 10/05/13 2247 10/08/13 0636 10/09/13 0553  LIPASE 179* 188* 178*     Recent Labs Lab 10/05/13 2248 10/08/13 1147  AMMONIA 42 53   CBC:  Recent Labs Lab 10/05/13 2247 10/06/13 0447 10/07/13 0554 10/08/13 0636 10/09/13 0553 10/10/13 0508  WBC 18.0* 15.5* 14.5* 13.8* 11.6* 11.3*  NEUTROABS 14.5*  --   --   --   --   --   HGB 9.1* 8.2* 7.7* 7.6* 7.2* 7.3*  HCT 27.5* 24.7* 23.4* 23.4* 22.2* 22.1*  MCV 115.5* 116.5* 119.4* 118.8* 118.7* 116.9*  PLT 151 114* 124* 123* 122* 150   Cardiac Enzymes: No results found for this basename: CKTOTAL, CKMB, CKMBINDEX, TROPONINI,  in the last 168 hours BNP (last 3 results) No results found for this basename: PROBNP,  in the last 8760 hours CBG: No results found for this basename: GLUCAP,  in the last 168 hours  Recent Results (from the past 240 hour(s))  URINE CULTURE     Status: None   Collection Time    10/08/13  3:10 PM      Result Value Ref Range Status   Specimen Description URINE, CATHETERIZED   Final   Special Requests NONE   Final   Culture  Setup Time     Final   Value: 10/09/2013 00:07     Performed at [place]     Final   Value: NO GROWTH     Performed at [facility]   Culture     Final   Value: NO GROWTH     Performed at [facility]   Report Status 10/09/2013 FINAL   Final     Studies: No results found.  Scheduled Meds: . cefTRIAXone (ROCEPHIN)  IV  1 g Intravenous Q24H  . folic acid  1 mg Oral Daily  . furosemide  200 mg Intravenous BID  . lactulose  10 g Oral Daily  . metolazone  5 mg Oral BID  . pantoprazole  40 mg Oral QAC breakfast  . potassium chloride  40 mEq Oral Q4H  . sodium chloride  3 mL Intravenous Q12H  . spironolactone  25 mg Oral Daily   Continuous Infusions:   Principal Problem:   ARF (acute renal failure) Active Problems:   Jaundice   Ascites   Hypokalemia   Leukocytosis, unspecified   Generalized weakness   Transaminitis   Acute renal failure   Anemia   Sinus tachycardia   Anasarca    Time  spent: 35 minutes    [facility] Pager [phone]. If 7PM-7AM, please contact night-coverage at [email or web address], password [facility] 10/10/2013, 10:31 AM  LOS: 5 days

## 2013-10-11 ENCOUNTER — Encounter (HOSPITAL_COMMUNITY): Payer: Self-pay | Admitting: Oncology

## 2013-10-11 DIAGNOSIS — I498 Other specified cardiac arrhythmias: Secondary | ICD-10-CM

## 2013-10-11 LAB — COMPREHENSIVE METABOLIC PANEL
ALK PHOS: 192 U/L — AB (ref 39–117)
ALT: 34 U/L (ref 0–35)
AST: 102 U/L — ABNORMAL HIGH (ref 0–37)
Albumin: 2.3 g/dL — ABNORMAL LOW (ref 3.5–5.2)
BILIRUBIN TOTAL: 3.6 mg/dL — AB (ref 0.3–1.2)
BUN: 31 mg/dL — AB (ref 6–23)
CHLORIDE: 91 meq/L — AB (ref 96–112)
CO2: 34 meq/L — AB (ref 19–32)
Calcium: 8.8 mg/dL (ref 8.4–10.5)
Creatinine, Ser: 1.18 mg/dL — ABNORMAL HIGH (ref 0.50–1.10)
GFR calc non Af Amer: 57 mL/min — ABNORMAL LOW (ref >90)
GFR, EST AFRICAN AMERICAN: 66 mL/min — AB (ref >90)
GLUCOSE: 106 mg/dL — AB (ref 70–99)
POTASSIUM: 2.7 meq/L — AB (ref 3.7–5.3)
SODIUM: 139 meq/L (ref 137–147)
Total Protein: 6.9 g/dL (ref 6.0–8.3)

## 2013-10-11 LAB — CBC
HEMATOCRIT: 21.9 % — AB (ref 36.0–46.0)
Hemoglobin: 7.2 g/dL — ABNORMAL LOW (ref 12.0–15.0)
MCH: 38.1 pg — AB (ref 26.0–34.0)
MCHC: 32.9 g/dL (ref 30.0–36.0)
MCV: 115.9 fL — AB (ref 78.0–100.0)
PLATELETS: 177 10*3/uL (ref 150–400)
RBC: 1.89 MIL/uL — AB (ref 3.87–5.11)
RDW: 18.6 % — ABNORMAL HIGH (ref 11.5–15.5)
WBC: 11.9 10*3/uL — ABNORMAL HIGH (ref 4.0–10.5)

## 2013-10-11 LAB — BASIC METABOLIC PANEL
BUN: 29 mg/dL — AB (ref 6–23)
CHLORIDE: 91 meq/L — AB (ref 96–112)
CO2: 35 meq/L — AB (ref 19–32)
CREATININE: 1.2 mg/dL — AB (ref 0.50–1.10)
Calcium: 8.9 mg/dL (ref 8.4–10.5)
GFR calc Af Amer: 65 mL/min — ABNORMAL LOW (ref >90)
GFR calc non Af Amer: 56 mL/min — ABNORMAL LOW (ref >90)
Glucose, Bld: 125 mg/dL — ABNORMAL HIGH (ref 70–99)
Potassium: 3 mEq/L — ABNORMAL LOW (ref 3.7–5.3)
Sodium: 139 mEq/L (ref 137–147)

## 2013-10-11 LAB — PROTIME-INR
INR: 1.3 (ref 0.00–1.49)
Prothrombin Time: 15.9 seconds — ABNORMAL HIGH (ref 11.6–15.2)

## 2013-10-11 LAB — URINALYSIS, ROUTINE W REFLEX MICROSCOPIC
Bilirubin Urine: NEGATIVE
Glucose, UA: NEGATIVE mg/dL
Ketones, ur: NEGATIVE mg/dL
Leukocytes, UA: NEGATIVE
NITRITE: NEGATIVE
PH: 5.5 (ref 5.0–8.0)
PROTEIN: NEGATIVE mg/dL
Specific Gravity, Urine: 1.015 (ref 1.005–1.030)
Urobilinogen, UA: 0.2 mg/dL (ref 0.0–1.0)

## 2013-10-11 LAB — URINE MICROSCOPIC-ADD ON

## 2013-10-11 LAB — OCCULT BLOOD X 1 CARD TO LAB, STOOL: FECAL OCCULT BLD: POSITIVE — AB

## 2013-10-11 MED ORDER — POTASSIUM CHLORIDE CRYS ER 20 MEQ PO TBCR
40.0000 meq | EXTENDED_RELEASE_TABLET | ORAL | Status: AC
  Administered 2013-10-11 (×2): 40 meq via ORAL
  Filled 2013-10-11 (×2): qty 2

## 2013-10-11 NOTE — Progress Notes (Signed)
Charge RN notified me that pt has foley, but no order from MD to keep foley in. NP was paged about obtaining order and she told me it would be better for the pt to remove the foley. I explained to the NP that pt has had bowel incontinence and may have some urinary incontinence as well, making accurate I&Os difficult. NP suggested using a BSC with hat instead of walking to bathroom to monitor I&OS, and still wished to remove foley to reduce risk of CAUTI. Will remove foley and continue to monitor.

## 2013-10-11 NOTE — Progress Notes (Signed)
IR PA has been following the patient's chart, patient continues to decline liver biopsy procedure. IR will cancel procedure order, please reorder if needed. Thank you  Tsosie Billing PA-C Interventional Radiology  10/11/13  11:14 AM

## 2013-10-11 NOTE — Progress Notes (Addendum)
    Subjective: Tolerating diet. Had episode of loose stool this morning while getting out of bed. No overt signs of GI bleeding. No N/V, abdominal pain. Denies confusion. Continues to decline liver biopsy.   Objective: Vital signs in last 24 hours: Temp:  [97.4 F (36.3 C)-97.6 F (36.4 C)] 97.6 F (36.4 C) (03/04 0421) Pulse Rate:  [117] 117 (03/04 0421) Resp:  [20] 20 (03/04 0421) BP: (108-121)/(69-75) 108/69 mmHg (03/04 0421) SpO2:  [90 %-96 %] 90 % (03/04 0421) Weight:  [177 lb 11.8 oz (80.621 kg)] 177 lb 11.8 oz (80.621 kg) (03/04 0412) Last BM Date: 10/10/13 General:   Alert and oriented, flat affect Head:  Normocephalic and atraumatic. Eyes:  +scleral icterus Heart:  S1, S2 present, no murmurs noted.  Lungs: Clear to auscultation bilaterally, without wheezing, rales, or rhonchi.  Abdomen:  Bowel sounds present, obese, full, +shifting dullness, fluid wave, non-tense ascites.  Extremities:  3+ pitting edema bilateral lower extremities but objectively appears to be mildly improved from several days ago  Neurologic:  Alert and  oriented x4; negative asterixis   Intake/Output from previous day: 03/03 0701 - 03/04 0700 In: 620 [P.O.:480; IV Piggyback:140] Out: 3650 [Urine:3650] Intake/Output this shift:    Lab Results:  Recent Labs  10/09/13 0553 10/10/13 0508 10/11/13 0512  WBC 11.6* 11.3* 11.9*  HGB 7.2* 7.3* 7.2*  HCT 22.2* 22.1* 21.9*  PLT 122* 150 177   BMET  Recent Labs  10/09/13 0553 10/10/13 0508 10/11/13 0512  NA 136* 137 139  K 4.1 3.3* 2.7*  CL 95* 94* 91*  CO2 27 28 34*  GLUCOSE 105* 102* 106*  BUN 35* 33* 31*  CREATININE 1.56* 1.36* 1.18*  CALCIUM 8.7 8.8 8.8   LFT  Recent Labs  10/10/13 0817 10/11/13 0512  PROT 7.0 6.9  ALBUMIN 2.3* 2.3*  AST 113* 102*  ALT 38* 34  ALKPHOS 204* 192*  BILITOT 3.9* 3.6*  BILIDIR 2.5*  --   IBILI 1.4*  --    PT/INR  Recent Labs  10/11/13 0512  LABPROT 15.9*  INR 1.30   Lab Results   Component Value Date   IRON 116 10/08/2013   TIBC NOT CALC 10/08/2013   FERRITIN 895* 10/08/2013     Assessment: 38 year old female with elevated LFTs, acute renal failure but continues to improve, extensive previous work-up recently without definite etiology; 24 hour urine for copper pending, celiac serology normal. Continues to decline liver biopsy. Etiology of underlying liver disease still remains unclear; without liver biopsy, limited further options for patient while hospitalized other than supportive care.   Mild improvement in anasarca/non-tense ascites. Diuretics managed by Nephrology. Diuresing well over past 24 hours, now with hypokalemia. Management per attending.    Plan: Follow-up on pending urine copper  PPI daily  2 gram sodium diet with fluid restriction  Diuretic management per nephrology  Monitor for any further loose stool; may need to adjust lactulose dosing Follow H/H; hold on transfusion unless Hgb significantly declines  Continue to recommend liver biopsy; other than supportive care, options limited for further investigation of underlying liver disease while inpatient.  Will need outpatient EGD for variceal screening, possible TCS (hx of ?colitis recent admission)  Orvil Feil, ANP-BC [facility]    LOS: 6 days    10/11/2013, 7:45 AM  Attending note:  Patient seen this afternoon. Agree with above assessment and recommendations.

## 2013-10-11 NOTE — Progress Notes (Signed)
The patient was seen and examined. She was discussed with nurse practitioner, Ms. black. Agree with her findings, assessment, and plan. We'll continue to monitor her serum potassium and add additional supplementation as appropriate. She has heme positive stool, but she denies gross bright red blood (or melena. All ask GI to address this. Will hold off on packed red blood cell transfusion for now, but if her hemoglobin continues to drift down to 7.0 below, would favor transfusion. Continue PPI. Consider discharge in the next 24-48 hours if she does not agree to liver biopsy.

## 2013-10-11 NOTE — Progress Notes (Signed)
Wanda [name]  MRN: [id number]  DOB/AGE: September 02, 1975 38 y.o.  Primary Care Physician:No primary provider on file.  Admit date: 10/05/2013  Chief Complaint:  Chief Complaint  Patient presents with  . Abdominal Pain    S-Pt presented on  10/05/2013 with  Chief Complaint  Patient presents with  . Abdominal Pain  .    Pt today feels better.Pt offers no new complaints.  Meds . feeding supplement (ENSURE COMPLETE)  237 mL Oral BID BM  . folic acid  1 mg Oral Daily  . furosemide  200 mg Intravenous BID  . lactulose  10 g Oral BID  . metolazone  5 mg Oral BID  . pantoprazole  40 mg Oral QAC breakfast  . sodium chloride  3 mL Intravenous Q12H  . spironolactone  25 mg Oral Daily     Physical Exam: Vital signs in last 24 hours: Temp:  [97.4 F (36.3 C)-97.6 F (36.4 C)] 97.6 F (36.4 C) (03/04 0421) Pulse Rate:  [117] 117 (03/04 0421) Resp:  [20] 20 (03/04 0421) BP: (108-121)/(69-75) 108/69 mmHg (03/04 0421) SpO2:  [90 %-96 %] 90 % (03/04 0421) Weight:  [177 lb 11.8 oz (80.621 kg)] 177 lb 11.8 oz (80.621 kg) (03/04 0412) Weight change: 10.2 oz (0.289 kg) Last BM Date: 10/11/13  Intake/Output from previous day: 03/03 0701 - 03/04 0700 In: 620 [P.O.:480; IV Piggyback:140] Out: 3650 [Urine:3650] Total I/O In: 190 [P.O.:120; IV Piggyback:70] Out: 850 [Urine:850]   Physical Exam: General- pt is awake,alert, oriented to time place and person Resp- No acute REsp distress, CTA B/L NO Rhonchi CVS- S1S2 regular ij rate and rhythm GIT- BS+, soft, NT, ND EXT- NO LE Edema, Cyanosis   Lab Results: CBC  Recent Labs  10/10/13 0508 10/11/13 0512  WBC 11.3* 11.9*  HGB 7.3* 7.2*  HCT 22.1* 21.9*  PLT 150 177    BMET  Recent Labs  10/10/13 0508 10/11/13 0512  NA 137 139  K 3.3* 2.7*  CL 94* 91*  CO2 28 34*  GLUCOSE 102* 106*  BUN 33* 31*  CREATININE 1.36* 1.18*  CALCIUM 8.8 8.8   Trend Creat 2015  3.11==>1.36==>1.18  MICRO Recent Results (from the past  240 hour(s))  URINE CULTURE     Status: None   Collection Time    10/08/13  3:10 PM      Result Value Ref Range Status   Specimen Description URINE, CATHETERIZED   Final   Special Requests NONE   Final   Culture  Setup Time     Final   Value: 10/09/2013 00:07     Performed at [place]     Final   Value: NO GROWTH     Performed at [facility]   Culture     Final   Value: NO GROWTH     Performed at [facility]   Report Status 10/09/2013 FINAL   Final      Lab Results  Component Value Date   CALCIUM 8.8 10/11/2013   PHOS 2.4 10/06/2013               Impression: 1)Renal  AKI secondary to Prerenal                AKI now better              2)CVS- Hemodynamically stable  3)Anemia HGb not at goal (9--11)   4)LIver- Unclear etiology of Hepatomegaly Pt refusing Liver  biopsy  5)Hypokalemia Being repleted   6)GERD on PPI  7)Acid base Co2 at goal     Plan:  Will continue current care.      Eniya Cannady S 10/11/2013, 2:11 PM

## 2013-10-11 NOTE — Progress Notes (Addendum)
Pt has had 5 dark loose BMs this AM. NP has been made aware, will check for fecal occult not testing for c. Diff as patient has been started on lactulose. Pt also asking for a higher dose of pain medication. NP has been notified, no new orders at this time. Will continue to monitor.

## 2013-10-11 NOTE — Progress Notes (Signed)
CRITICAL VALUE ALERT  Critical value received:  Potassium = 2.7  Date of notification:  10/11/13  Time of notification:  0640  Critical value read back:yes  Nurse who received alert:  Manuela Schwartz, RN  MD notified (1st page):  [place] Pink  Time of first page:  [phone]  MD notified (2nd page):  Time of second page:  Responding MD:  [place] Pink  Time MD responded:  [phone] Order placed in [place].

## 2013-10-11 NOTE — Progress Notes (Signed)
[facility] PROGRESS NOTE  Wanda [name] PNT:[id number] DOB: 01-21-1976 DOA: 10/05/2013 PCP: No primary provider on file.  38 year old woman with liver disease of unclear etiology with associated jaundice and ascites. Presented with generalized weakness and vague symptomology. She was admitted for unspecified liver disease with ascites and jaundice, acute renal failure, severe hypokalemia. Followed by nephrology and GI.  Assessment/Plan: ARF (acute renal failure)  - unclear etiology. Continues to improve.  Cr continues to trend down today at 1.18 from 3.11 on admission. Appreciate nephrology assistance. Case discussed with nephrology Dr. Donell Sievert IgA nephropathy vs Amyloidosis mild hepatorenal syndrome-typically hepatorenal syndrome would not resolve this quickly with minimal management or steroids Currently on spironolactone, Lasix increase to 200 BID per renal. Urine output continues to improve. Continue to monitor intake and output.  UTI  -UA and pt's symptoms suggestive of UTI. Rocephin for 3 days. Will discontinue. Urine culture with no growth.  Hypokalemia  -related to diuresis. Replace and recheck.  Transaminitis with jaundice and ascites  - unclear etiology at this time. Liver function tests improving but remain elevated. Await 24 hour urine copper. Celiac serology within the limits of normal. Anti smooth muscle ab, anti mitochondrial ab WNL, ANA negative. - A1A (alpha 1 antitrypsin) elevated. Continues to refuse liver biopsy. Appreciate GI input.Lipase remains elevated but stable 178. Continue lactulose. Has had several loose stools. Consider adjusting dosage. ammonia level 53 10/08/13.  Leukocytosis, unspecified  - WBC stable at 11.9 Chart review indicates chronic and current level near baseline. She remains afebrile and non-toxic appearing. trending down slowly. Completed 3 days of rocephin for presumed UTI.  Anemia, acute on chronic blood loss with macrocytosis  - Hg  on admission 9.1 --> 7.2 this AM. FOBT +. GI following. Will notify. Hold off on transfusion at this time. Repeat CBC in AM  Sinus tachycardia  - continue to monitor on telemetry. HR range 114. No complaints of chest pain  Thrombocytopenia  - overall remains stable  - no signs of active bleeding  - repeat CBC in AM    Code Status: full Family Communication: none present Disposition Plan: home when ready   Consultants:  Nephrology  GI  Procedures:    Antibiotics: Rocephin 10/07/13-10/09/13  HPI/Subjective: Lying in bed. Complains of generalized pain. Denies nausea or sob  Objective: Filed Vitals:   10/11/13 0421  BP: 108/69  Pulse: 117  Temp: 97.6 F (36.4 C)  Resp: 20    Intake/Output Summary (Last 24 hours) at 10/11/13 1406 Last data filed at 10/11/13 1346  Gross per 24 hour  Intake    380 ml  Output   3500 ml  Net  -3120 ml   Filed Weights   10/09/13 0529 10/10/13 0500 10/11/13 0412  Weight: 85.095 kg (187 lb 9.6 oz) 80.332 kg (177 lb 1.6 oz) 80.621 kg (177 lb 11.8 oz)    Exam:   General:  Appears comfortable  Cardiovascular: tachycardia but regular, no m/g/r/ 2+ pitting edema bilaterally  Respiratory: normal effort BS clear bilaterally to ausculation no wheeze no rhonchi  Abdomen: ascites with +Fluid wave. Not tense. Non-tender to palpation. +BS  Musculoskeletal: no clubbing or cyanosis. Joints without swelling/erythema   Data Reviewed: Basic Metabolic Panel:  Recent Labs Lab 10/06/13 0447 10/06/13 1222 10/07/13 0554 10/08/13 0636 10/09/13 0553 10/10/13 0508 10/11/13 0512  NA 134*  --  135* 132* 136* 137 139  K 2.7*  --  5.1 4.9 4.1 3.3* 2.7*  CL 86*  --  97 93* 95*  94* 91*  CO2 22  --  23 25 27 28  34*  GLUCOSE 81  --  102* 110* 105* 102* 106*  BUN 24*  --  29* 34* 35* 33* 31*  CREATININE 3.10*  --  2.79* 1.99* 1.56* 1.36* 1.18*  CALCIUM 7.9*  --  8.3* 8.5 8.7 8.8 8.8  MG 1.5  --   --   --   --   --   --   PHOS  --  2.4  --   --    --   --   --    Liver Function Tests:  Recent Labs Lab 10/05/13 2247 10/06/13 0447 10/08/13 0636 10/10/13 0817 10/11/13 0512  AST 179* 160* 144* 113* 102*  ALT 38* 34 43* 38* 34  ALKPHOS 283* 250* 235* 204* 192*  BILITOT 4.5* 4.2* 4.8* 3.9* 3.6*  PROT 7.8 6.9 7.2 7.0 6.9  ALBUMIN 2.5* 2.2* 2.4* 2.3* 2.3*    Recent Labs Lab 10/05/13 2247 10/08/13 0636 10/09/13 0553  LIPASE 179* 188* 178*    Recent Labs Lab 10/05/13 2248 10/08/13 1147  AMMONIA 42 53   CBC:  Recent Labs Lab 10/05/13 2247  10/07/13 0554 10/08/13 0636 10/09/13 0553 10/10/13 0508 10/11/13 0512  WBC 18.0*  < > 14.5* 13.8* 11.6* 11.3* 11.9*  NEUTROABS 14.5*  --   --   --   --   --   --   HGB 9.1*  < > 7.7* 7.6* 7.2* 7.3* 7.2*  HCT 27.5*  < > 23.4* 23.4* 22.2* 22.1* 21.9*  MCV 115.5*  < > 119.4* 118.8* 118.7* 116.9* 115.9*  PLT 151  < > 124* 123* 122* 150 177  < > = values in this interval not displayed. Cardiac Enzymes: No results found for this basename: CKTOTAL, CKMB, CKMBINDEX, TROPONINI,  in the last 168 hours BNP (last 3 results) No results found for this basename: PROBNP,  in the last 8760 hours CBG: No results found for this basename: GLUCAP,  in the last 168 hours  Recent Results (from the past 240 hour(s))  URINE CULTURE     Status: None   Collection Time    10/08/13  3:10 PM      Result Value Ref Range Status   Specimen Description URINE, CATHETERIZED   Final   Special Requests NONE   Final   Culture  Setup Time     Final   Value: 10/09/2013 00:07     Performed at [place]     Final   Value: NO GROWTH     Performed at [facility]   Culture     Final   Value: NO GROWTH     Performed at [facility]   Report Status 10/09/2013 FINAL   Final     Studies: No results found.  Scheduled Meds: . feeding supplement (ENSURE COMPLETE)  237 mL Oral BID BM  . folic acid  1 mg Oral Daily  . furosemide  200 mg Intravenous BID  . lactulose   10 g Oral BID  . metolazone  5 mg Oral BID  . pantoprazole  40 mg Oral QAC breakfast  . sodium chloride  3 mL Intravenous Q12H  . spironolactone  25 mg Oral Daily   Continuous Infusions:   Principal Problem:   IgA nephropathy associated with liver disease Active Problems:   Jaundice   Ascites   Hypokalemia   Leukocytosis, unspecified   ARF (acute renal failure)  Generalized weakness   Transaminitis   Acute renal failure   Anemia   Sinus tachycardia   Anasarca    Time spent: 35 minutes    [place] [facility] Pager [phone]. If 7PM-7AM, please contact night-coverage at [email or web address], password [facility] 10/11/2013, 2:06 PM  LOS: 6 days

## 2013-10-11 NOTE — Progress Notes (Signed)
See my progress note same day, Epic has prevented me from signing this note until today 3/4.  Murray Hodgkins, MD [facility] [phone]

## 2013-10-12 ENCOUNTER — Inpatient Hospital Stay (HOSPITAL_COMMUNITY): Payer: Medicare Other

## 2013-10-12 DIAGNOSIS — R7989 Other specified abnormal findings of blood chemistry: Secondary | ICD-10-CM

## 2013-10-12 DIAGNOSIS — D649 Anemia, unspecified: Secondary | ICD-10-CM

## 2013-10-12 DIAGNOSIS — R609 Edema, unspecified: Secondary | ICD-10-CM

## 2013-10-12 LAB — BASIC METABOLIC PANEL
BUN: 27 mg/dL — AB (ref 6–23)
BUN: 26 mg/dL — ABNORMAL HIGH (ref 6–23)
CALCIUM: 8.9 mg/dL (ref 8.4–10.5)
CALCIUM: 9 mg/dL (ref 8.4–10.5)
CO2: 38 mEq/L — ABNORMAL HIGH (ref 19–32)
CO2: 38 meq/L — AB (ref 19–32)
CREATININE: 1.02 mg/dL (ref 0.50–1.10)
Chloride: 89 mEq/L — ABNORMAL LOW (ref 96–112)
Chloride: 89 mEq/L — ABNORMAL LOW (ref 96–112)
Creatinine, Ser: 1.09 mg/dL (ref 0.50–1.10)
GFR calc Af Amer: 73 mL/min — ABNORMAL LOW (ref >90)
GFR calc Af Amer: 79 mL/min — ABNORMAL LOW (ref >90)
GFR calc non Af Amer: 63 mL/min — ABNORMAL LOW (ref >90)
GFR, EST NON AFRICAN AMERICAN: 68 mL/min — AB (ref >90)
Glucose, Bld: 116 mg/dL — ABNORMAL HIGH (ref 70–99)
Glucose, Bld: 114 mg/dL — ABNORMAL HIGH (ref 70–99)
Potassium: 2.5 mEq/L — CL (ref 3.7–5.3)
Potassium: 3.2 mEq/L — ABNORMAL LOW (ref 3.7–5.3)
Sodium: 141 mEq/L (ref 137–147)
Sodium: 140 mEq/L (ref 137–147)

## 2013-10-12 LAB — CBC
HEMATOCRIT: 22.1 % — AB (ref 36.0–46.0)
Hemoglobin: 7.2 g/dL — ABNORMAL LOW (ref 12.0–15.0)
MCH: 38.1 pg — AB (ref 26.0–34.0)
MCHC: 32.6 g/dL (ref 30.0–36.0)
MCV: 116.9 fL — ABNORMAL HIGH (ref 78.0–100.0)
Platelets: 207 10*3/uL (ref 150–400)
RBC: 1.89 MIL/uL — ABNORMAL LOW (ref 3.87–5.11)
RDW: 18.6 % — ABNORMAL HIGH (ref 11.5–15.5)
WBC: 13.5 10*3/uL — ABNORMAL HIGH (ref 4.0–10.5)

## 2013-10-12 LAB — HEMOGLOBIN AND HEMATOCRIT, BLOOD
HCT: 25.4 % — ABNORMAL LOW (ref 36.0–46.0)
HEMOGLOBIN: 8.2 g/dL — AB (ref 12.0–15.0)

## 2013-10-12 LAB — HEPATIC FUNCTION PANEL
ALK PHOS: 180 U/L — AB (ref 39–117)
ALT: 33 U/L (ref 0–35)
AST: 97 U/L — ABNORMAL HIGH (ref 0–37)
Albumin: 2.3 g/dL — ABNORMAL LOW (ref 3.5–5.2)
BILIRUBIN INDIRECT: 1.1 mg/dL — AB (ref 0.3–0.9)
Bilirubin, Direct: 1.8 mg/dL — ABNORMAL HIGH (ref 0.0–0.3)
TOTAL PROTEIN: 7.1 g/dL (ref 6.0–8.3)
Total Bilirubin: 2.9 mg/dL — ABNORMAL HIGH (ref 0.3–1.2)

## 2013-10-12 LAB — COPPER, URINE, 24 HOUR
COPPER, URINE (24 HR): 39 ug/L — AB (ref 2–30)
Creatinine, Urine mg/day-CUURI: 0.97 g/(24.h) (ref 0.63–2.50)
TOTAL VOLUME - CURRI: 725 mL

## 2013-10-12 LAB — PREPARE RBC (CROSSMATCH)

## 2013-10-12 MED ORDER — POTASSIUM CHLORIDE 10 MEQ/100ML IV SOLN
10.0000 meq | INTRAVENOUS | Status: AC
  Administered 2013-10-12 (×4): 10 meq via INTRAVENOUS
  Filled 2013-10-12 (×4): qty 100

## 2013-10-12 MED ORDER — POTASSIUM CHLORIDE CRYS ER 20 MEQ PO TBCR
40.0000 meq | EXTENDED_RELEASE_TABLET | Freq: Once | ORAL | Status: AC
  Administered 2013-10-12: 40 meq via ORAL
  Filled 2013-10-12: qty 2

## 2013-10-12 MED ORDER — LEVOFLOXACIN 500 MG PO TABS: 500.0000 mg | ORAL_TABLET | Freq: Every day | ORAL | Status: DC

## 2013-10-12 MED ORDER — DIPHENHYDRAMINE HCL 25 MG PO CAPS
25.0000 mg | ORAL_CAPSULE | Freq: Once | ORAL | Status: AC
  Administered 2013-10-12: 25 mg via ORAL
  Filled 2013-10-12: qty 1

## 2013-10-12 MED ORDER — LACTULOSE 10 GM/15ML PO SOLN: 10.0000 g | Freq: Every day | ORAL | Status: DC

## 2013-10-12 MED ORDER — BENZONATATE 100 MG PO CAPS
100.0000 mg | ORAL_CAPSULE | Freq: Three times a day (TID) | ORAL | Status: DC | PRN
  Filled 2013-10-12: qty 1

## 2013-10-12 MED ORDER — ZOLPIDEM TARTRATE 5 MG PO TABS
5.0000 mg | ORAL_TABLET | Freq: Once | ORAL | Status: AC
  Administered 2013-10-12: 5 mg via ORAL
  Filled 2013-10-12: qty 1

## 2013-10-12 MED ORDER — POTASSIUM CHLORIDE CRYS ER 20 MEQ PO TBCR
40.0000 meq | EXTENDED_RELEASE_TABLET | Freq: Two times a day (BID) | ORAL | Status: DC
  Administered 2013-10-12 (×2): 40 meq via ORAL
  Filled 2013-10-12 (×2): qty 2

## 2013-10-12 MED ORDER — TORSEMIDE 20 MG PO TABS
50.0000 mg | ORAL_TABLET | Freq: Every day | ORAL | Status: DC
  Administered 2013-10-12 – 2013-10-13 (×2): 50 mg via ORAL
  Filled 2013-10-12 (×2): qty 3

## 2013-10-12 MED ORDER — ACETAMINOPHEN 325 MG PO TABS
650.0000 mg | ORAL_TABLET | Freq: Once | ORAL | Status: AC
  Administered 2013-10-12: 650 mg via ORAL
  Filled 2013-10-12: qty 2

## 2013-10-12 NOTE — Progress Notes (Signed)
UR chart review completed.  

## 2013-10-12 NOTE — Progress Notes (Signed)
The patient was seen and examined. She was discussed with nurse practitioner, Ms. Renard Hamper. Agree with her assessment and findings with additions below. The patient is noted to have a mild persistent leukocytosis which has increased 2 point since yesterday. She denies subjective fever and chills. Her loose stools have been attributed to lactulose which has now been discontinued. She does have a cough which has been intermittent with clear and brown sputum for several days, but she denies pleurisy or shortness of breath. Therefore, we will order a chest x-ray to rule out underlying pneumonia. We'll order [employer] as needed for cough.   Clearly her renal function has improved. I have discussed this patient with nephrologist, Dr. Lowanda Foster. He believes that the patient had prerenal azotemia from hypoperfusion from her anasarca and liver disease and not IgA nephropathy or amyloidosis or hepatorenal syndrome. He does note that the patient has a poly-gammopathy of unknown significance. This can be followed as an outpatient. Given that the patient has refused liver biopsy, it is unlikely that she would agree to undergo a bone marrow biopsy. Following aggressive IV Lasix, her renal function has normalized. Demadex was started as stated by Ms. Black.  Of note, the patient has persistent sinus tachycardia. Her TSH was within normal limits. Agree with packed red blood cell transfusion as ordered by GI.

## 2013-10-12 NOTE — Progress Notes (Signed)
[facility] PROGRESS NOTE  DENAYAH SHERRICK [id number] DOB: Nov 05, 1975 DOA: 10/05/2013 PCP: No primary provider on file.  38 year old woman with liver disease of unclear etiology with associated jaundice and ascites. Presented with generalized weakness and vague symptomology. She was admitted for unspecified liver disease with ascites and jaundice, acute renal failure, severe hypokalemia. Followed by nephrology and GI. Transfused 1 unit PRBC for Hg 7.2.  Assessment/Plan: ARF (acute renal failure)  - Likely prerenal as opposed to amyloidosis or hepatorenal syndrome. Continues to improve. Cr continues to trend down today at 1.02 from 3.11 on admission. Appreciate nephrology assistance. Currently on spironolactone, Lasix discontinued  per renal. Demadex started.  Urine output good. Continue to monitor intake and output.  UTI  -UA and pt's symptoms suggestive of UTI. Rocephin for 3 days. Will discontinue. Urine culture with no growth.  Hypokalemia  -related to diuresis. Replace and recheck.  Transaminitis with jaundice and ascites  - unclear etiology at this time. Liver function tests improving but remain elevated. Await 24 hour urine copper. Celiac serology within the limits of normal. Anti smooth muscle ab, anti mitochondrial ab WNL, ANA negative. - A1A (alpha 1 antitrypsin) elevated. Continues to refuse liver biopsy. Appreciate GI input.Lipase remains elevated but stable 178. Will discontinue lactulose. Has had several loose stools. Ammonia level within the limits of normal 10/08/13.   Leukocytosis, unspecified  - WBC trending upward. She remains afebrile an non-toxic. Received 3 days rocephin for presumed UTI. Consider chest xray. Encourage Incentive spirometry.  Chart review indicates chronic.  Anemia, acute on chronic blood loss with macrocytosis  - Hg on admission 9.1 --> 7.2 this AM. FOBT +. GI following. Transfusing 1 unit PRBC's. Concern for GI bleed. May need to schedule work up as  OP.  CBC in AM  Sinus tachycardia  - continue to monitor on telemetry. HR range 114. No complaints of chest pain  Thrombocytopenia  - resolved today. - repeat CBC in AM  GI bleed FOBT +. GI following. Will transfuse 1 unit PRBC's. Defer work up to GI  Code Status: full Family Communication: non present Disposition Plan: home when ready   Consultants:  Nephrology  GI  Procedures:  none  Antibiotics:  Rocephin 10/07/13>>>10/09/13  HPI/Subjective: Sitting on side of be. Report "anxiety" and "weakness".   Objective: Filed Vitals:   10/12/13 0624  BP: 109/58  Pulse: 111  Temp: 97.2 F (36.2 C)  Resp: 20    Intake/Output Summary (Last 24 hours) at 10/12/13 1134 Last data filed at 10/12/13 0800  Gross per 24 hour  Intake    500 ml  Output   2000 ml  Net  -1500 ml   Filed Weights   10/10/13 0500 10/11/13 0412 10/12/13 0624  Weight: 80.332 kg (177 lb 1.6 oz) 80.621 kg (177 lb 11.8 oz) 78.019 kg (172 lb)    Exam:   General:  Appears calm and comfortable  Cardiovascular: tachycardic but regular S1 and S2. No murmur gallup or rub. 2+ bilateral pitting LE edema  Respiratory: normal effort but respirations a little shallow. i hear no wheeze or rhonchi  Abdomen: ascites but no tense. +fluid wave non-tender to palpation  Musculoskeletal: no clubbing or cyanosis   Data Reviewed: Basic Metabolic Panel:  Recent Labs Lab 10/06/13 0447 10/06/13 1222  10/10/13 0508 10/11/13 0512 10/11/13 2006 10/12/13 0506 10/12/13 1020  NA 134*  --   < > 137 139 139 141 140  K 2.7*  --   < > 3.3* 2.7* 3.0*  2.5* 3.2*  CL 86*  --   < > 94* 91* 91* 89* 89*  CO2 22  --   < > 28 34* 35* 38* 38*  GLUCOSE 81  --   < > 102* 106* 125* 116* 114*  BUN 24*  --   < > 33* 31* 29* 27* 26*  CREATININE 3.10*  --   < > 1.36* 1.18* 1.20* 1.09 1.02  CALCIUM 7.9*  --   < > 8.8 8.8 8.9 8.9 9.0  MG 1.5  --   --   --   --   --   --   --   PHOS  --  2.4  --   --   --   --   --   --   < > =  values in this interval not displayed. Liver Function Tests:  Recent Labs Lab 10/06/13 0447 10/08/13 0636 10/10/13 0817 10/11/13 0512 10/12/13 0506  AST 160* 144* 113* 102* 97*  ALT 34 43* 38* 34 33  ALKPHOS 250* 235* 204* 192* 180*  BILITOT 4.2* 4.8* 3.9* 3.6* 2.9*  PROT 6.9 7.2 7.0 6.9 7.1  ALBUMIN 2.2* 2.4* 2.3* 2.3* 2.3*    Recent Labs Lab 10/05/13 2247 10/08/13 0636 10/09/13 0553  LIPASE 179* 188* 178*    Recent Labs Lab 10/05/13 2248 10/08/13 1147  AMMONIA 42 53   CBC:  Recent Labs Lab 10/05/13 2247  10/08/13 0636 10/09/13 0553 10/10/13 0508 10/11/13 0512 10/12/13 0506  WBC 18.0*  < > 13.8* 11.6* 11.3* 11.9* 13.5*  NEUTROABS 14.5*  --   --   --   --   --   --   HGB 9.1*  < > 7.6* 7.2* 7.3* 7.2* 7.2*  HCT 27.5*  < > 23.4* 22.2* 22.1* 21.9* 22.1*  MCV 115.5*  < > 118.8* 118.7* 116.9* 115.9* 116.9*  PLT 151  < > 123* 122* 150 177 207  < > = values in this interval not displayed. Cardiac Enzymes: No results found for this basename: CKTOTAL, CKMB, CKMBINDEX, TROPONINI,  in the last 168 hours BNP (last 3 results) No results found for this basename: PROBNP,  in the last 8760 hours CBG: No results found for this basename: GLUCAP,  in the last 168 hours  Recent Results (from the past 240 hour(s))  URINE CULTURE     Status: None   Collection Time    10/08/13  3:10 PM      Result Value Ref Range Status   Specimen Description URINE, CATHETERIZED   Final   Special Requests NONE   Final   Culture  Setup Time     Final   Value: 10/09/2013 00:07     Performed at [facility]     Final   Value: NO GROWTH     Performed at [facility]   Culture     Final   Value: NO GROWTH     Performed at [facility]   Report Status 10/09/2013 FINAL   Final     Studies: No results found.  Scheduled Meds: . acetaminophen  650 mg Oral Once  . diphenhydrAMINE  25 mg Oral Once  . feeding supplement (ENSURE COMPLETE)  237 mL Oral  BID BM  . folic acid  1 mg Oral Daily  . metolazone  5 mg Oral BID  . pantoprazole  40 mg Oral QAC breakfast  . potassium chloride  10 mEq Intravenous Q1 Hr x  4  . potassium chloride  40 mEq Oral BID  . sodium chloride  3 mL Intravenous Q12H  . spironolactone  25 mg Oral Daily  . torsemide  50 mg Oral Daily   Continuous Infusions:   Principal Problem:   ARF (acute renal failure) Active Problems:   Jaundice   Ascites   Hypokalemia   Leukocytosis, unspecified   Generalized weakness   Transaminitis   Acute renal failure   Anemia   Sinus tachycardia   Anasarca    Time spent: 30 minutes    [facility] Pager [phone]. If 7PM-7AM, please contact night-coverage at [email or web address], password [facility] 10/12/2013, 11:34 AM  LOS: 7 days

## 2013-10-12 NOTE — Progress Notes (Signed)
CRITICAL VALUE ALERT  Critical value received:  Potassium 2.5  Date of notification: 10/12/13  Time of notification:  0630  Critical value read back:yes  Nurse who received alert:  [place]  MD notified (1st page): yes  Time of first page:  [phone]  MD notified (2nd page):  Time of second page:  Responding MD:    Time MD responded:

## 2013-10-12 NOTE — Progress Notes (Signed)
Patient has been up to chair today. Back to bed at this time.  Tolerating blood well.

## 2013-10-12 NOTE — Progress Notes (Signed)
Subjective: Interval History:No new complanit . Denies any nausea or vomiting. Patient also denies any difficulty in breathing. Still feels weak  Objective: Vital signs in last 24 hours: Temp:  [97.2 F (36.2 C)-98.2 F (36.8 C)] 97.2 F (36.2 C) (03/05 0624) Pulse Rate:  [111-121] 111 (03/05 0624) Resp:  [20] 20 (03/05 0624) BP: (109-125)/(58-72) 109/58 mmHg (03/05 0624) SpO2:  [94 %-100 %] 100 % (03/05 0624) Weight:  [78.019 kg (172 lb)] 78.019 kg (172 lb) (03/05 0624) Weight change: -2.602 kg (-5 lb 11.8 oz)  Intake/Output from previous day: 03/04 0701 - 03/05 0700 In: 500 [P.O.:360; IV Piggyback:140] Out: 2000 [Urine:2000] Intake/Output this shift: Total I/O In: 120 [P.O.:120] Out: -   General appearance: alert, cooperative and no distress Resp: diminished breath sounds bilaterally Cardio: regular rate and rhythm, S1, S2 normal, no murmur, click, rub or gallop GI: Distended, nontender and no rebound tenderness. Extremities: edema 2+ edema bilaterally.  Lab Results:  Recent Labs  10/11/13 0512 10/12/13 0506  WBC 11.9* 13.5*  HGB 7.2* 7.2*  HCT 21.9* 22.1*  PLT 177 207   BMET:   Recent Labs  10/11/13 2006 10/12/13 0506  NA 139 141  K 3.0* 2.5*  CL 91* 89*  CO2 35* 38*  GLUCOSE 125* 116*  BUN 29* 27*  CREATININE 1.20* 1.09  CALCIUM 8.9 8.9   No results found for this basename: PTH,  in the last 72 hours Iron Studies:  No results found for this basename: IRON, TIBC, TRANSFERRIN, FERRITIN,  in the last 72 hours  Studies/Results: No results found.  I have reviewed the patient's current medications.  Assessment/Plan: Problem #1 acute kidney injury her BUN is 27 and her  creatinine is 1.09. Her renal function is progressively getting better Problem #2 hypokalemia: Potassium is 2.5 Her potassium is declining . Patient on potassium supplement and also Aldactone. Problem #3 anemia her hemoglobin remains low. Patient with Gi bleeding and being followed by  GI Problem #4 anasarca: Presently patient on iv lasix and oral metolazone and her urine out put has increased significantly  But still patient with anasarca Problem #5 liver cirrhosis  Problem #6 metabolic bone disease: Calcium and phosphorus in range Problem #7 possible UTI. Patient on antibiotics. Her white blood cell count is high but improving. Plan: Continue with potassium supplemnet We'll check her basic metabolic panel  in the morning. D/c lasix Demadex 50 mg po once a day   LOS: 7 days   Wanda [name] S 10/12/2013,10:45 AM

## 2013-10-12 NOTE — Progress Notes (Signed)
Subjective:  Patient continues to complain of loose stools. No overt GI bleeding. No n/v, abd pain. She complains of feeling weak and dizzy with ambulation. Continues to decline liver biopsy. Discussed possibility of EGD/colonoscopy at later date and patient states she is not sure about that.  Objective: Vital signs in last 24 hours: Temp:  [97.2 F (36.2 C)-98.2 F (36.8 C)] 97.2 F (36.2 C) (03/05 0624) Pulse Rate:  [111-121] 111 (03/05 0624) Resp:  [20] 20 (03/05 0624) BP: (109-125)/(58-72) 109/58 mmHg (03/05 0624) SpO2:  [94 %-100 %] 100 % (03/05 0624) Weight:  [172 lb (78.019 kg)] 172 lb (78.019 kg) (03/05 0624) Last BM Date: 10/12/13 General:   Alert,  Well-developed, well-nourished, pleasant and cooperative in NAD Head:  Normocephalic and atraumatic. Eyes:  Sclera clear, no icterus.   Abdomen:  Soft, distended but not tense. NT.  Normal bowel sounds, without guarding, and without rebound.   Extremities:  Without clubbing, deformity. 2+ pitting edema to knees. Neurologic:  Alert and  oriented x4;  grossly normal neurologically. Skin:  Intact without significant lesions or rashes. Psych:  Alert and cooperative. Normal mood and affect.  Intake/Output from previous day: 03/04 0701 - 03/05 0700 In: 500 [P.O.:360; IV Piggyback:140] Out: 2000 [Urine:2000] Intake/Output this shift: Total I/O In: 120 [P.O.:120] Out: -   Lab Results: CBC  Recent Labs  10/10/13 0508 10/11/13 0512 10/12/13 0506  WBC 11.3* 11.9* 13.5*  HGB 7.3* 7.2* 7.2*  HCT 22.1* 21.9* 22.1*  MCV 116.9* 115.9* 116.9*  PLT 150 177 207   BMET  Recent Labs  10/11/13 2006 10/12/13 0506 10/12/13 1020  NA 139 141 140  K 3.0* 2.5* 3.2*  CL 91* 89* 89*  CO2 35* 38* 38*  GLUCOSE 125* 116* 114*  BUN 29* 27* 26*  CREATININE 1.20* 1.09 1.02  CALCIUM 8.9 8.9 9.0   LFTs  Recent Labs  10/10/13 0817 10/11/13 0512 10/12/13 0506  BILITOT 3.9* 3.6* 2.9*  BILIDIR 2.5*  --  1.8*  IBILI 1.4*  --  1.1*   ALKPHOS 204* 192* 180*  AST 113* 102* 97*  ALT 38* 34 33  PROT 7.0 6.9 7.1  ALBUMIN 2.3* 2.3* 2.3*   No results found for this basename: LIPASE,  in the last 72 hours PT/INR  Recent Labs  10/11/13 0512  LABPROT 15.9*  INR 1.30      Imaging Studies: US Abdomen Complete  09/12/2013   CLINICAL DATA:  Jaundice  EXAM: ULTRASOUND ABDOMEN COMPLETE  COMPARISON:  CT abdomen and pelvis 09/11/2013  FINDINGS: Gallbladder:  Sludge within gallbladder. No gallbladder wall thickening or sonographic Murphy sign.  Common bile duct:  Diameter: 3 mm diameter, normal  Liver:  Echogenic, which can be seen with fatty infiltration and cirrhosis is well as certain infiltrative disorders. A few images suggests minimal nodularity of the hepatic surface. No discrete hepatic mass identified. No intrahepatic biliary dilatation. Diminished blood flow is identified in the portal vein.  IVC:  No abnormality visualized.  Pancreas:  Limited due to hepatic enlargement and increased hepatic echogenicity. Small portion of the pancreatic body and proximal tail is normal appearance, remainder obscured.  Spleen:  Normal appearance, 10.5 cm length  Right Kidney:  Length: 11.7 cm.  Normal morphology without mass or hydronephrosis.  Left Kidney:  Length: 11.2 cm.  Normal morphology without mass or hydronephrosis.  Abdominal aorta:  Inadequately visualized for assessment due to combination of enlarged echogenic liver and Central bowel loops due to ascites.  Other findings:  Free intraperitoneal fluid consistent with ascites.  IMPRESSION: Enlarged echogenic liver which can be seen with fatty infiltration and cirrhosis as well as certain infiltrative disorders.  Question minimal nodularity of the hepatic surface on a few images, unable to exclude cirrhosis.  Ascites.  Question diminished flow in the portal vein, see visceral Doppler ultrasound report.   Electronically Signed   By: Lavonia Dana M.D.   On: 09/12/2013 14:55        Assessment: 38 year old female with anasarca, elevated LFTs, acute renal failure (which continues to improve). Etiology of underlying liver disease remains unclear; patient continues to decline liver biopsy. Extensive work-up without definite etiology for liver disease. Still concerned about possible etoh use although patient does not admit. Her LFTs continue to improve this admission.   Her 24 hour urinary copper is minimally elevated (may not be clinically significant) and without other positive parameters, cannot make diagnosis of Wilson's disease.   Anemia, symptomatic. Plan for one unit. She will need colonoscopy and EGD at later date for heme + stool and variceal screening. Patient currently does not consent to procedures. We will plan for one unit of prbcs, patient agrees.   Folate deficiency.  Weight down 5 pounds since yesterday, continues to diurese.  Plan: 1. Stop lactulose. 2. EGD/colonoscopy later if patient agreeable. 3. Transfuse one unit prbcs today. 4. Continue to recommend liver bx but patient refuses.  5. Discussed need for close outpatient f/u, patient states she may not be able to commit to it.  6. From a GI standpoint, there is not much more to offer as inpatient.    LOS: 7 days   Neil Crouch  10/12/2013, 1:38 PM  Attending:  Patient seen and examined earlier today. Patient's somewhat evasive behavior has impeded our ability to evaluate her during this hospitalization. I also wonder about the possibility of occult alcohol ingestion. As previously recommended, we will offer her the opportunity to see [place] in the office to continue her evaluation as an outpatient.

## 2013-10-13 ENCOUNTER — Encounter (HOSPITAL_COMMUNITY): Payer: Self-pay | Admitting: Internal Medicine

## 2013-10-13 ENCOUNTER — Telehealth: Payer: Self-pay | Admitting: Gastroenterology

## 2013-10-13 DIAGNOSIS — K769 Liver disease, unspecified: Secondary | ICD-10-CM | POA: Diagnosis present

## 2013-10-13 DIAGNOSIS — E538 Deficiency of other specified B group vitamins: Secondary | ICD-10-CM | POA: Diagnosis present

## 2013-10-13 DIAGNOSIS — J189 Pneumonia, unspecified organism: Secondary | ICD-10-CM

## 2013-10-13 DIAGNOSIS — E873 Alkalosis: Secondary | ICD-10-CM | POA: Diagnosis not present

## 2013-10-13 HISTORY — DX: Pneumonia, unspecified organism: J18.9

## 2013-10-13 LAB — TYPE AND SCREEN
ABO/RH(D): A POS
Antibody Screen: NEGATIVE
Unit division: 0

## 2013-10-13 LAB — CBC
HCT: 27 % — ABNORMAL LOW (ref 36.0–46.0)
Hemoglobin: 8.7 g/dL — ABNORMAL LOW (ref 12.0–15.0)
MCH: 35.1 pg — ABNORMAL HIGH (ref 26.0–34.0)
MCHC: 32.2 g/dL (ref 30.0–36.0)
MCV: 108.9 fL — ABNORMAL HIGH (ref 78.0–100.0)
Platelets: 239 10*3/uL (ref 150–400)
RBC: 2.48 MIL/uL — ABNORMAL LOW (ref 3.87–5.11)
RDW: 26.1 % — AB (ref 11.5–15.5)
WBC: 12.1 10*3/uL — ABNORMAL HIGH (ref 4.0–10.5)

## 2013-10-13 LAB — BASIC METABOLIC PANEL
BUN: 24 mg/dL — ABNORMAL HIGH (ref 6–23)
CHLORIDE: 85 meq/L — AB (ref 96–112)
CO2: 41 mEq/L (ref 19–32)
Calcium: 9.2 mg/dL (ref 8.4–10.5)
Creatinine, Ser: 1.09 mg/dL (ref 0.50–1.10)
GFR calc non Af Amer: 63 mL/min — ABNORMAL LOW (ref >90)
GFR, EST AFRICAN AMERICAN: 73 mL/min — AB (ref >90)
GLUCOSE: 136 mg/dL — AB (ref 70–99)
POTASSIUM: 3.6 meq/L — AB (ref 3.7–5.3)
Sodium: 139 mEq/L (ref 137–147)

## 2013-10-13 LAB — BLOOD GAS, ARTERIAL
ACID-BASE EXCESS: 16.8 mmol/L — AB (ref 0.0–2.0)
Bicarbonate: 41 mEq/L — ABNORMAL HIGH (ref 20.0–24.0)
FIO2: 21 %
O2 Saturation: 90.5 %
PCO2 ART: 46.6 mmHg — AB (ref 35.0–45.0)
PH ART: 7.553 — AB (ref 7.350–7.450)
Patient temperature: 37
TCO2: 37.9 mmol/L (ref 0–100)
pO2, Arterial: 55.9 mmHg — ABNORMAL LOW (ref 80.0–100.0)

## 2013-10-13 LAB — COMPREHENSIVE METABOLIC PANEL
ALBUMIN: 2.5 g/dL — AB (ref 3.5–5.2)
ALT: 33 U/L (ref 0–35)
AST: 107 U/L — ABNORMAL HIGH (ref 0–37)
Alkaline Phosphatase: 179 U/L — ABNORMAL HIGH (ref 39–117)
BUN: 24 mg/dL — ABNORMAL HIGH (ref 6–23)
CO2: 40 mEq/L (ref 19–32)
Calcium: 9 mg/dL (ref 8.4–10.5)
Chloride: 87 mEq/L — ABNORMAL LOW (ref 96–112)
Creatinine, Ser: 1.05 mg/dL (ref 0.50–1.10)
GFR calc Af Amer: 76 mL/min — ABNORMAL LOW (ref >90)
GFR calc non Af Amer: 66 mL/min — ABNORMAL LOW (ref >90)
Glucose, Bld: 104 mg/dL — ABNORMAL HIGH (ref 70–99)
POTASSIUM: 2.9 meq/L — AB (ref 3.7–5.3)
Sodium: 141 mEq/L (ref 137–147)
TOTAL PROTEIN: 7.5 g/dL (ref 6.0–8.3)
Total Bilirubin: 3.2 mg/dL — ABNORMAL HIGH (ref 0.3–1.2)

## 2013-10-13 MED ORDER — FOLIC ACID 1 MG PO TABS: 1.0000 mg | ORAL_TABLET | Freq: Every day | ORAL | Status: DC

## 2013-10-13 MED ORDER — SPIRONOLACTONE 25 MG PO TABS: 50.0000 mg | ORAL_TABLET | Freq: Every day | ORAL | Status: DC

## 2013-10-13 MED ORDER — TORSEMIDE 10 MG PO TABS: 50.0000 mg | ORAL_TABLET | Freq: Every day | ORAL | Status: DC

## 2013-10-13 MED ORDER — LEVOFLOXACIN 500 MG PO TABS
500.0000 mg | ORAL_TABLET | Freq: Every day | ORAL | Status: DC
  Administered 2013-10-13: 500 mg via ORAL
  Filled 2013-10-13: qty 1

## 2013-10-13 MED ORDER — POTASSIUM CHLORIDE CRYS ER 20 MEQ PO TBCR
40.0000 meq | EXTENDED_RELEASE_TABLET | Freq: Three times a day (TID) | ORAL | Status: DC
  Administered 2013-10-13 (×2): 40 meq via ORAL
  Filled 2013-10-13 (×2): qty 2

## 2013-10-13 MED ORDER — LEVOFLOXACIN 500 MG PO TABS: 500.0000 mg | ORAL_TABLET | Freq: Every day | ORAL | Status: DC

## 2013-10-13 MED ORDER — POTASSIUM CHLORIDE CRYS ER 20 MEQ PO TBCR: 30.0000 meq | EXTENDED_RELEASE_TABLET | Freq: Every day | ORAL | Status: DC

## 2013-10-13 MED ORDER — DICLOFENAC SODIUM 1 % TD GEL: 2.0000 g | Freq: Three times a day (TID) | TRANSDERMAL | Status: DC | PRN

## 2013-10-13 MED ORDER — THERA VITAL M PO TABS: 1.0000 | ORAL_TABLET | Freq: Every day | ORAL | Status: DC

## 2013-10-13 MED ORDER — SPIRONOLACTONE 50 MG PO TABS: 50.0000 mg | ORAL_TABLET | Freq: Every day | ORAL | Status: DC

## 2013-10-13 MED ORDER — DICLOFENAC SODIUM 1 % TD GEL
2.0000 g | Freq: Four times a day (QID) | TRANSDERMAL | Status: DC
  Administered 2013-10-13: 2 g via TOPICAL
  Filled 2013-10-13: qty 100

## 2013-10-13 MED ORDER — POTASSIUM CHLORIDE 10 MEQ/100ML IV SOLN
10.0000 meq | INTRAVENOUS | Status: AC
  Administered 2013-10-13 (×3): 10 meq via INTRAVENOUS
  Filled 2013-10-13 (×3): qty 100

## 2013-10-13 MED ORDER — OXYCODONE HCL 5 MG PO TABS: 5.0000 mg | ORAL_TABLET | ORAL | Status: DC | PRN

## 2013-10-13 MED ORDER — PANTOPRAZOLE SODIUM 40 MG PO TBEC: 40.0000 mg | DELAYED_RELEASE_TABLET | Freq: Every day | ORAL | Status: DC

## 2013-10-13 NOTE — Discharge Summary (Signed)
Physician Discharge Summary  JORDAN CARAVEO WUJ:[id number] DOB: 05-09-1976 DOA: 10/05/2013  PCP: Reola Calkins) [place] date: 10/05/2013 Discharge date: 10/13/2013  Time spent: Greater than 30 minutes  Recommendations for Outpatient Follow-up:  1. The patient will need a close followup of her serum potassium, bicarbonate, and renal function on multiple occasions. 2. She will need a followup of her liver transaminases and hemoglobin/hematocrit.   Discharge Diagnoses:    1. Liver disease/hepatopathy of unknown etiology. (The patient repeatedly refused liver biopsy). 2. Associated hepatic transaminitis, hyperbilirubinemia, anasarca/ascites, jaundice, and mild coagulopathy. 3. Acute renal failure, secondary to prerenal azotemia/hypoperfusion and possibly ATN. Resolved. Creatinine was 3.11 on admission and 1.09 at the time of discharge. 4. Multifactorial anemia, including anemia of chronic disease and folic acid deficiency anemia. Status post 1 unit of packed red blood cell transfusion. 5. Mild interstitial pneumonia. 6. Chronic sinus tachycardia. TSH was within normal limits. 7. Metabolic alkalosis, likely contraction alkalosis following diuretic therapy. 8. Mild persistent leukocytosis. 9. Hypokalemia, likely secondary to diuretic therapy. 10. Generalized weakness/deconditioning.  Discharge Condition: Improved and stable.  Diet recommendation: Heart healthy.  Filed Weights   10/10/13 0500 10/11/13 0412 10/12/13 0624  Weight: 80.332 kg (177 lb 1.6 oz) 80.621 kg (177 lb 11.8 oz) 78.019 kg (172 lb)    History of present illness:   Wanda [name] is a 38 y.o. female with a past medical history of liver disease of unclear etiology with jaundice and ascites. She was recently hospitalized earlier this month and was discharged on February 4. She was discharged on diuretics and antibiotics. She mentioned that she had been taking her medications regularly. She has always had generalized  weakness which she attributes to her liver disease. However, the weakness has been worse in the last week or so. Unfortunately, patient was a very poor historian. It was very difficult to get any information from her. She denied any abdominal pain, per se, but she did have some discomfort due to the ascites. No fever. No chills. Denied any diarrhea. She had small quantities of vomiting occasionally. She had noticed a decreased amount of urination over the last week or so.  In the ED, she was afebrile and tachycardic with a heart rate ranging from 116 beats per minute to 133 beats per minute. Her EKG reveals sinus tachycardia. Her blood pressure ranged from 956-213 systolically. She was oxygenating 97% on supplemental oxygen and she was afebrile. Her lab data were significant for a WBC of 18.0, hemoglobin of 9.1, MCV of 115, platelet count of 81, potassium of 2.6, BUN of 24, creatinine of 3.11, AST of 179, ALT of 38, total bilirubin of 4.5, lipase of 179, and PT of 15.5. She was admitted for further evaluation and management.     Hospital Course:   1. Acute renal failure. During the previous hospitalization in February for treatment of anasarca and ascites, she was treated with diuretic therapy. She was discharged on furosemide and Aldactone. Her creatinine was 0.70 on 09/13/13. On admission, it was 3.11. It was felt that she was volume depleted. She was started on IV fluid hydration and her diuretics were initially withheld. Nephrology was consulted. Dr. Lowanda Foster changed and modified her IV fluids and restarted Aldactone and then Lasix. Over the course of the hospitalization, IV fluids were eventually discontinued and she was started on high dose IV Lasix for several days. IV Lasix was discontinued in favor of Zaroxolyn, torsemide, and Aldactone. Her urine output improved. Her creatinine progressively decreased.  At the time of hospital discharge, it was 1.09. Because of possible contraction alkalosis,  Zaroxolyn was discontinued. She was discharged on torsemide and Aldactone. The differential diagnosis was wide, but it appeared that her acute renal failure was likely the result of prerenal azotemia and hypoperfusion from chronic liver disease and not from hepatorenal syndrome or IgA nephropathy.  2. Hepatopathy/liver disease of unknown etiology (associated ascites, coagulopathy, liver transaminitis, hyperbilirubinemia, thrombocytopenia) The patient was hospitalized in early February and was found to have liver disease of unknown etiology. The plan was for her to followup with gastroenterology as an outpatient.  During that hospitalization, a number of laboratory studies were ordered without yielding a definitive diagnosis. The gastroenterology team was consulted and followed the patient during the hospital course. Additional studies were ordered during this hospitalization. Her alpha 1 antitrypsin level was mildly elevated, but this elevation was not typically associated with liver disease. Her 24 hour urine copper was minimally elevated, but gastroenterology did not believe this isolated finding was clinically significant enough to make a diagnosis of Wilson's disease without other positive parameters. Celiac serology was negative. Liver biopsy was recommended for a definitive diagnosis. Actually this was arranged with interventional radiology at [facility], but the patient refused the liver biopsy. She was asked again on several occasions and was given the potential benefits of the biopsy and knowing the definitive diagnosis, but the patient continued to refuse without a clear explanation why. Dr. Jena Gauss and colleagues stated that their role was limited by the patient's refusal to undergo the biopsy, but they were wiling tol follow the patient as an outpatient if she so desires. Her liver transaminases and total bilirubin levels waxed and waned, but overall, they did decrease. Thrombocytopenia  resolved. She was less coagulopathic.  3. Macrocytic Anemia; Folate deficiency The patient was anemic throughout the hospitalization as she had been during the previous hospitalization. Her Hb was 7.9 on 09/13/2013 and 8.2 on admission. It continued to drift down to 7.2 without evidence of gross bleeding, but her stool was heme positive. Protonix was started emprically. Her folate was low at 1.3 on 09/12/12, but her Vitamin B12 was WNL. Follow up iron was 116 and ferritin was 895. Haptoglobin was essentially normal. She was eventually transfused 1 unit of prb cells. Her Hb was 8.7 at  The time of discharge.GI offered an elective EGD and colonoscopy, but the patient did not commit to the procedures.  4.Hypokalemia The patient was supplemented in the IV fluids and orally for persistent hypokalemia from diuretic therapy. she was discharged on potassium chloride supplementation. The dose of Aldactone was increased by nephrology, which will help. She will need a close followup of her serum potassium.  5. Mild persistent leukocytosis/UTI/interstitial pneumonia.  She was treated early in the hospital course with Rocephin for 3 days for treatment of a mild urinary tract infection. Following treatment, her white blood cell count still ranged from 11,000 -13,000. She remained afebrile throughout the hospitalization. A chest x-ray was ordered and was suggestive of interstitial pneumonia. Therefore, Levaquin was started. She was discharged on Levaquin for a few more days.    6. Chronic tachycardia.  Etiology was unclear, but she had no complaints of chest pain or shortness of breath. Her TSH was within normal limits.  7. Metabolic alkalosis.  Her CO2 gradually increased to 40 and then 41 at the time of discharge. This was thought to be secondary to contraction alkalosis from aggressive diuresis. Diuretic therapy was decreased and changed  as above. She was given gentle IV fluids with potassium runs. Her bicarbonate  level will need to be monitored in the outpatient setting.        Procedures:  None  Consultations:  Gastroenterology  Nephrology  Discharge Exam: Filed Vitals:   10/13/13 1448  BP: 106/64  Pulse: 116  Temp: 97.8 F (36.6 C)  Resp: 20    General: debilitated-appearing 37 year old woman who appears older than her stated age.  Cardiovascular: S1, S2, with tachycardia.  Respiratory: Clear to auscultation bilaterally. Abdomen: Mild ascites, nontender. Positive bowel sounds, non-tense. Extremities: 1+ mildly pitting edema bilaterally. Neurologic: She is alert and oriented x3. Cranial nerves II through XII are intact.   Discharge Instructions  Discharge Orders   Future Orders Complete By Expires   Diet - low sodium heart healthy  As directed    Discharge instructions  As directed    Comments:     #1. You will need to have your blood potassium and your kidney function test rechecked at your hospital followup appointments.  2. Avoid taking acetaminophen or Tylenol. 3. Avoid drinking alcohol.   Increase activity slowly  As directed        Medication List    STOP taking these medications       ciprofloxacin 500 MG tablet  Commonly known as:  CIPRO     metroNIDAZOLE 500 MG tablet  Commonly known as:  FLAGYL      TAKE these medications       diclofenac sodium 1 % Gel  Commonly known as:  VOLTAREN  Apply 2 g topically 3 (three) times daily as needed (For joint pain).     folic acid 1 MG tablet  Commonly known as:  FOLVITE  Take 1 tablet (1 mg total) by mouth daily.     levofloxacin 500 MG tablet  Commonly known as:  LEVAQUIN  Take 1 tablet (500 mg total) by mouth daily. Antibiotic to be taken for 6 more days.     multivitamin tablet  Take 1 tablet by mouth daily.     oxyCODONE 5 MG immediate release tablet  Commonly known as:  Oxy IR/ROXICODONE  Take 1 tablet (5 mg total) by mouth every 4 (four) hours as needed for severe pain.     pantoprazole 40 MG  tablet  Commonly known as:  PROTONIX  Take 1 tablet (40 mg total) by mouth daily before breakfast.     potassium chloride SA 20 MEQ tablet  Commonly known as:  K-DUR,KLOR-CON  Take 1.5 tablets (30 mEq total) by mouth daily. DO NOT TAKE THIS MEDICATION IF YOU MISS YOUR TORSEMIDE DOSE.     spironolactone 50 MG tablet  Commonly known as:  ALDACTONE  Take 1 tablet (50 mg total) by mouth daily. Medicine to help decrease swelling.  Start taking on:  10/14/2013     torsemide 10 MG tablet  Commonly known as:  DEMADEX  Take 5 tablets (50 mg total) by mouth daily. Medicine to help decrease swelling.       No Known Allergies     Follow-up Information   Follow up with [facility] S, MD In 4 weeks.   Specialty:  Nephrology   Contact information:   [address] [address] [phone]       Follow up On 10/19/2013. (at 9:00)    Contact information:   [facility] [address] [place], [place] [address] [id number]      Follow up with Neil Crouch,  Hershal Coria [name], PA or Dr. Roseanne Kaufman office will call you. Their phone # is [phone])    Specialty:  Gastroenterology   Contact information:   [address] [place] [address] [phone]        The results of significant diagnostics from this hospitalization (including imaging, microbiology, ancillary and laboratory) are listed below for reference.    Significant Diagnostic Studies: Dg Chest 2 View  10/12/2013   CLINICAL DATA:  Cough  EXAM: CHEST  2 VIEW  COMPARISON:  CT ABD - PELV W/ CM dated 09/11/2013; DG ABD ACUTE W/CHEST dated 09/11/2013; DG CHEST 1V PORT dated 08/10/2007  FINDINGS: Low lung volumes. Cardiac silhouette within normal limits. With technique taking into consideration there is mild prominence of the interstitial markings without evidence of peribronchial cuffing. No focal regions of consolidation or focal infiltrates. Osseous structures are unremarkable.   IMPRESSION: Mild diffuse interstitial infiltrate infectious versus inflammatory.   Electronically Signed   By: Margaree Mackintosh M.D.   On: 10/12/2013 15:55    Microbiology: Recent Results (from the past 240 hour(s))  URINE CULTURE     Status: None   Collection Time    10/08/13  3:10 PM      Result Value Ref Range Status   Specimen Description URINE, CATHETERIZED   Final   Special Requests NONE   Final   Culture  Setup Time     Final   Value: 10/09/2013 00:07     Performed at [facility]     Final   Value: NO GROWTH     Performed at [facility]   Culture     Final   Value: NO GROWTH     Performed at [facility]   Report Status 10/09/2013 FINAL   Final     Labs: Basic Metabolic Panel:  Recent Labs Lab 10/11/13 2006 10/12/13 0506 10/12/13 1020 10/13/13 0456 10/13/13 1350  NA 139 141 140 141 139  K 3.0* 2.5* 3.2* 2.9* 3.6*  CL 91* 89* 89* 87* 85*  CO2 35* 38* 38* 40* 41*  GLUCOSE 125* 116* 114* 104* 136*  BUN 29* 27* 26* 24* 24*  CREATININE 1.20* 1.09 1.02 1.05 1.09  CALCIUM 8.9 8.9 9.0 9.0 9.2   Liver Function Tests:  Recent Labs Lab 10/08/13 0636 10/10/13 0817 10/11/13 0512 10/12/13 0506 10/13/13 0456  AST 144* 113* 102* 97* 107*  ALT 43* 38* 34 33 33  ALKPHOS 235* 204* 192* 180* 179*  BILITOT 4.8* 3.9* 3.6* 2.9* 3.2*  PROT 7.2 7.0 6.9 7.1 7.5  ALBUMIN 2.4* 2.3* 2.3* 2.3* 2.5*    Recent Labs Lab 10/08/13 0636 10/09/13 0553  LIPASE 188* 178*    Recent Labs Lab 10/08/13 1147  AMMONIA 53   CBC:  Recent Labs Lab 10/09/13 0553 10/10/13 0508 10/11/13 0512 10/12/13 0506 10/12/13 1955 10/13/13 0456  WBC 11.6* 11.3* 11.9* 13.5*  --  12.1*  HGB 7.2* 7.3* 7.2* 7.2* 8.2* 8.7*  HCT 22.2* 22.1* 21.9* 22.1* 25.4* 27.0*  MCV 118.7* 116.9* 115.9* 116.9*  --  108.9*  PLT 122* 150 177 207  --  239   Cardiac Enzymes: No results found for this basename: CKTOTAL, CKMB, CKMBINDEX, TROPONINI,  in the last 168  hours BNP: BNP (last 3 results) No results found for this basename: PROBNP,  in the last 8760 hours CBG: No results found for this basename: GLUCAP,  in the last 168 hours  Signed:  Cayla Wiegand  [facility] 10/13/2013, 3:59 PM

## 2013-10-13 NOTE — Progress Notes (Signed)
Subjective:  No bowel movement today. Denies abdominal pain, vomiting.  Objective: Vital signs in last 24 hours: Temp:  [97.4 F (36.3 C)-98.4 F (36.9 C)] 97.8 F (36.6 C) (03/06 0512) Pulse Rate:  [114-120] 120 (03/06 0512) Resp:  [18-22] 20 (03/06 0512) BP: (95-117)/(58-73) 115/64 mmHg (03/06 0512) SpO2:  [93 %-98 %] 94 % (03/06 0512) Last BM Date: 10/13/13 General:   Alert,  Well-developed, well-nourished, pleasant and cooperative in NAD Head:  Normocephalic and atraumatic. Eyes:  Sclera clear, no icterus.  Abdomen:  Soft, distended but not tense. Nontender. Normal bowel sounds, without guarding, and without rebound.   Extremities:  Without clubbing, deformity. 2+ pitting edema to knees bilaterally. Neurologic:  Alert and  oriented x4;  grossly normal neurologically. Skin:  Intact without significant lesions or rashes. Psych:  Alert and cooperative. Normal mood and affect.  Intake/Output from previous day: 03/05 0701 - 03/06 0700 In: 1240 [P.O.:360; I.V.:100; Blood:310; IV Piggyback:470] Out: 700 [Urine:700] Intake/Output this shift: Total I/O In: 240 [P.O.:240] Out: 550 [Urine:550]  Lab Results: CBC  Recent Labs  10/11/13 0512 10/12/13 0506 10/12/13 1955 10/13/13 0456  WBC 11.9* 13.5*  --  12.1*  HGB 7.2* 7.2* 8.2* 8.7*  HCT 21.9* 22.1* 25.4* 27.0*  MCV 115.9* 116.9*  --  108.9*  PLT 177 207  --  239   BMET  Recent Labs  10/12/13 0506 10/12/13 1020 10/13/13 0456  NA 141 140 141  K 2.5* 3.2* 2.9*  CL 89* 89* 87*  CO2 38* 38* 40*  GLUCOSE 116* 114* 104*  BUN 27* 26* 24*  CREATININE 1.09 1.02 1.05  CALCIUM 8.9 9.0 9.0   LFTs  Recent Labs  10/11/13 0512 10/12/13 0506 10/13/13 0456  BILITOT 3.6* 2.9* 3.2*  BILIDIR  --  1.8*  --   IBILI  --  1.1*  --   ALKPHOS 192* 180* 179*  AST 102* 97* 107*  ALT 34 33 33  PROT 6.9 7.1 7.5  ALBUMIN 2.3* 2.3* 2.5*   No results found for this basename: LIPASE,  in the last 72 hours PT/INR  Recent  Labs  10/11/13 0512  LABPROT 15.9*  INR 1.30      Imaging Studies: Dg Chest 2 View  10/12/2013   CLINICAL DATA:  Cough  EXAM: CHEST  2 VIEW  COMPARISON:  CT ABD - PELV W/ CM dated 09/11/2013; DG ABD ACUTE W/CHEST dated 09/11/2013; DG CHEST 1V PORT dated 08/10/2007  FINDINGS: Low lung volumes. Cardiac silhouette within normal limits. With technique taking into consideration there is mild prominence of the interstitial markings without evidence of peribronchial cuffing. No focal regions of consolidation or focal infiltrates. Osseous structures are unremarkable.  IMPRESSION: Mild diffuse interstitial infiltrate infectious versus inflammatory.   Electronically Signed   By: Margaree Mackintosh M.D.   On: 10/12/2013 15:55  [2 weeks]   Assessment:  38 year old female with anasarca, elevated LFTs, acute renal failure (which continues to improve). Etiology of underlying liver disease remains unclear; patient continues to decline liver biopsy. Extensive work-up without definite etiology for liver disease. Still concerned about possible etoh use although patient does not admit. Her LFTs continue to improve this admission.   Her 24 hour urinary copper is minimally elevated (may not be clinically significant) and without other positive parameters, cannot make diagnosis of Wilson's disease.   Anemia, symptomatic. Received one unit of packed red blood cells yesterday. She will need colonoscopy and EGD at later date for heme + stool and variceal screening. Patient  currently does not consent to procedures.  Folate deficiency.   Plan: 1. Plan for short interval followup with patient. We will see her in 2 weeks. To obtain LFTs, CBC, met-7 in one week. 2. Continue folic acid.   LOS: 8 days   Neil Crouch  10/13/2013, 2:36 PM

## 2013-10-13 NOTE — Progress Notes (Signed)
Subjective: Interval History:No new complanit . She complains of weakness.  Objective: Vital signs in last 24 hours: Temp:  [97.4 F (36.3 C)-98.4 F (36.9 C)] 97.8 F (36.6 C) (03/06 0512) Pulse Rate:  [114-120] 120 (03/06 0512) Resp:  [18-22] 20 (03/06 0512) BP: (95-117)/(58-73) 115/64 mmHg (03/06 0512) SpO2:  [93 %-98 %] 94 % (03/06 0512) Weight change:   Intake/Output from previous day: 03/05 0701 - 03/06 0700 In: 1240 [P.O.:360; I.V.:100; Blood:310; IV Piggyback:470] Out: 700 [Urine:700] Intake/Output this shift:    General appearance: alert, cooperative and no distress Resp: diminished breath sounds bilaterally Cardio: regular rate and rhythm, S1, S2 normal, no murmur, click, rub or gallop GI: Distended, nontender and no rebound tenderness. Extremities: edema 2+ edema bilaterally.  Lab Results:  Recent Labs  10/12/13 0506 10/12/13 1955 10/13/13 0456  WBC 13.5*  --  12.1*  HGB 7.2* 8.2* 8.7*  HCT 22.1* 25.4* 27.0*  PLT 207  --  239   BMET:   Recent Labs  10/12/13 1020 10/13/13 0456  NA 140 141  K 3.2* 2.9*  CL 89* 87*  CO2 38* 40*  GLUCOSE 114* 104*  BUN 26* 24*  CREATININE 1.02 1.05  CALCIUM 9.0 9.0   No results found for this basename: PTH,  in the last 72 hours Iron Studies:  No results found for this basename: IRON, TIBC, TRANSFERRIN, FERRITIN,  in the last 72 hours  Studies/Results: Dg Chest 2 View  10/12/2013   CLINICAL DATA:  Cough  EXAM: CHEST  2 VIEW  COMPARISON:  CT ABD - PELV W/ CM dated 09/11/2013; DG ABD ACUTE W/CHEST dated 09/11/2013; DG CHEST 1V PORT dated 08/10/2007  FINDINGS: Low lung volumes. Cardiac silhouette within normal limits. With technique taking into consideration there is mild prominence of the interstitial markings without evidence of peribronchial cuffing. No focal regions of consolidation or focal infiltrates. Osseous structures are unremarkable.  IMPRESSION: Mild diffuse interstitial infiltrate infectious versus  inflammatory.   Electronically Signed   By: Margaree Mackintosh M.D.   On: 10/12/2013 15:55    I have reviewed the patient's current medications.  Assessment/Plan: Problem #1 acute kidney injury her BUN is 24 and her  creatinine is 1.05. Her renal function is progressively getting better. Problem #2 hypokalemia: Potassium is 2.9 Her potassium is declining . Patient on potassium supplement and also Aldactone. Problem #3 anemia her hemoglobin remains low. Patient with Gi bleeding and being followed by GI Problem #4 anasarca: Presently patient on iv lasix and oral metolazone and her urine out put has increased significantly  But still patient with anasarca Problem #5 liver cirrhosis  Problem #6 metabolic bone disease: Calcium and phosphorus in range Problem #7 possible UTI. Patient on antibiotics. Her white blood cell count is high but improving. Problem #8 high CO2. This could be secondary to respiratory alkalosis as patient has liver disease however this could be secondary to metabolic alkalosis. Plan: Continue with potassium supplemnet We'll check her basic metabolic panel  in the morning. D/c metolazone Increase Aldactone to 50 mg once a day. We'll check her ABG.   LOS: 8 days   Honi Name S 10/13/2013,8:57 AM

## 2013-10-13 NOTE — Progress Notes (Signed)
CRITICAL VALUE ALERT  Critical value received:  CO2 41   Date of notification:  10/13/2013  Time of notification:  1400  Critical value read back: yes  Nurse who received alert:  Jomarie Longs   MD notified (1st page):  D. Fisher  Time of first page:  1400  MD notified (2nd page):  Time of second page:  Responding MD:  Dr. Caryn Section  Time MD responded:  [phone]

## 2013-10-13 NOTE — Telephone Encounter (Signed)
Patient needs E30 hospital f/u in 2 week.s Needs CMET, CBC in 1 week.

## 2013-10-13 NOTE — Progress Notes (Addendum)
REVIEWED. CMP IMPROVING. MAY CONSIDER CBC IN 2 WEEKS.

## 2013-10-13 NOTE — Progress Notes (Signed)
Pt has critical potassium of 2.9 and CO2 40. Md paged.

## 2013-10-16 NOTE — Telephone Encounter (Signed)
Please note. Discussed with Dr. Oneida Alar.  Check CBC in one week. OV in 2 weeks. We will check CMET later.

## 2013-10-16 NOTE — Progress Notes (Signed)
UR chart review completed.  

## 2013-10-17 ENCOUNTER — Other Ambulatory Visit: Payer: Self-pay

## 2013-10-17 ENCOUNTER — Encounter: Payer: Self-pay | Admitting: Gastroenterology

## 2013-10-17 DIAGNOSIS — D649 Anemia, unspecified: Secondary | ICD-10-CM

## 2013-10-17 NOTE — Telephone Encounter (Signed)
Tried to call pt and VM not set up. Mailing the lab order with a note to please do on 10/23/2013.

## 2013-10-17 NOTE — Telephone Encounter (Signed)
Routing to DS. This is an [facility] pt. 

## 2013-10-17 NOTE — Telephone Encounter (Signed)
Pt is aware of OV on 3/31 at 10 with [facility] and appt card was mailed

## 2013-11-05 ENCOUNTER — Emergency Department (HOSPITAL_COMMUNITY): Payer: Medicare Other

## 2013-11-05 ENCOUNTER — Encounter (HOSPITAL_COMMUNITY): Payer: Self-pay | Admitting: Emergency Medicine

## 2013-11-05 ENCOUNTER — Inpatient Hospital Stay (HOSPITAL_COMMUNITY)
Admission: EM | Admit: 2013-11-05 | Discharge: 2013-11-17 | DRG: 871 | Disposition: A | Discharge status: Home Health | Payer: Medicare Other | Attending: Internal Medicine | Admitting: Internal Medicine

## 2013-11-05 DIAGNOSIS — E669 Obesity, unspecified: Secondary | ICD-10-CM | POA: Diagnosis present

## 2013-11-05 DIAGNOSIS — J189 Pneumonia, unspecified organism: Secondary | ICD-10-CM

## 2013-11-05 DIAGNOSIS — R000 Tachycardia, unspecified: Secondary | ICD-10-CM

## 2013-11-05 DIAGNOSIS — E860 Dehydration: Secondary | ICD-10-CM

## 2013-11-05 DIAGNOSIS — D6959 Other secondary thrombocytopenia: Secondary | ICD-10-CM | POA: Diagnosis present

## 2013-11-05 DIAGNOSIS — K769 Liver disease, unspecified: Secondary | ICD-10-CM

## 2013-11-05 DIAGNOSIS — Z87891 Personal history of nicotine dependence: Secondary | ICD-10-CM

## 2013-11-05 DIAGNOSIS — R7401 Elevation of levels of liver transaminase levels: Secondary | ICD-10-CM

## 2013-11-05 DIAGNOSIS — E876 Hypokalemia: Secondary | ICD-10-CM

## 2013-11-05 DIAGNOSIS — D72829 Elevated white blood cell count, unspecified: Secondary | ICD-10-CM

## 2013-11-05 DIAGNOSIS — R17 Unspecified jaundice: Secondary | ICD-10-CM

## 2013-11-05 DIAGNOSIS — D649 Anemia, unspecified: Secondary | ICD-10-CM

## 2013-11-05 DIAGNOSIS — K766 Portal hypertension: Secondary | ICD-10-CM | POA: Diagnosis present

## 2013-11-05 DIAGNOSIS — K7460 Unspecified cirrhosis of liver: Secondary | ICD-10-CM

## 2013-11-05 DIAGNOSIS — R609 Edema, unspecified: Secondary | ICD-10-CM

## 2013-11-05 DIAGNOSIS — R740 Nonspecific elevation of levels of transaminase and lactic acid dehydrogenase [LDH]: Secondary | ICD-10-CM

## 2013-11-05 DIAGNOSIS — A419 Sepsis, unspecified organism: Principal | ICD-10-CM | POA: Diagnosis present

## 2013-11-05 DIAGNOSIS — D638 Anemia in other chronic diseases classified elsewhere: Secondary | ICD-10-CM | POA: Diagnosis present

## 2013-11-05 DIAGNOSIS — K7290 Hepatic failure, unspecified without coma: Secondary | ICD-10-CM

## 2013-11-05 DIAGNOSIS — K7682 Hepatic encephalopathy: Secondary | ICD-10-CM

## 2013-11-05 DIAGNOSIS — T502X5A Adverse effect of carbonic-anhydrase inhibitors, benzothiadiazides and other diuretics, initial encounter: Secondary | ICD-10-CM | POA: Diagnosis present

## 2013-11-05 DIAGNOSIS — K7031 Alcoholic cirrhosis of liver with ascites: Secondary | ICD-10-CM | POA: Diagnosis present

## 2013-11-05 DIAGNOSIS — I509 Heart failure, unspecified: Secondary | ICD-10-CM | POA: Diagnosis present

## 2013-11-05 DIAGNOSIS — F329 Major depressive disorder, single episode, unspecified: Secondary | ICD-10-CM | POA: Diagnosis present

## 2013-11-05 DIAGNOSIS — N189 Chronic kidney disease, unspecified: Secondary | ICD-10-CM | POA: Diagnosis present

## 2013-11-05 DIAGNOSIS — D62 Acute posthemorrhagic anemia: Secondary | ICD-10-CM

## 2013-11-05 DIAGNOSIS — E0781 Sick-euthyroid syndrome: Secondary | ICD-10-CM | POA: Diagnosis present

## 2013-11-05 DIAGNOSIS — I8510 Secondary esophageal varices without bleeding: Secondary | ICD-10-CM | POA: Diagnosis present

## 2013-11-05 DIAGNOSIS — I498 Other specified cardiac arrhythmias: Secondary | ICD-10-CM

## 2013-11-05 DIAGNOSIS — K7581 Nonalcoholic steatohepatitis (NASH): Secondary | ICD-10-CM

## 2013-11-05 DIAGNOSIS — N179 Acute kidney failure, unspecified: Secondary | ICD-10-CM

## 2013-11-05 DIAGNOSIS — K529 Noninfective gastroenteritis and colitis, unspecified: Secondary | ICD-10-CM

## 2013-11-05 DIAGNOSIS — R601 Generalized edema: Secondary | ICD-10-CM

## 2013-11-05 DIAGNOSIS — K319 Disease of stomach and duodenum, unspecified: Secondary | ICD-10-CM | POA: Diagnosis present

## 2013-11-05 DIAGNOSIS — F319 Bipolar disorder, unspecified: Secondary | ICD-10-CM | POA: Diagnosis present

## 2013-11-05 DIAGNOSIS — R600 Localized edema: Secondary | ICD-10-CM

## 2013-11-05 DIAGNOSIS — F0631 Mood disorder due to known physiological condition with depressive features: Secondary | ICD-10-CM

## 2013-11-05 DIAGNOSIS — G4700 Insomnia, unspecified: Secondary | ICD-10-CM | POA: Diagnosis present

## 2013-11-05 DIAGNOSIS — N289 Disorder of kidney and ureter, unspecified: Secondary | ICD-10-CM

## 2013-11-05 DIAGNOSIS — R531 Weakness: Secondary | ICD-10-CM

## 2013-11-05 DIAGNOSIS — K219 Gastro-esophageal reflux disease without esophagitis: Secondary | ICD-10-CM | POA: Diagnosis present

## 2013-11-05 DIAGNOSIS — R188 Other ascites: Secondary | ICD-10-CM

## 2013-11-05 DIAGNOSIS — K652 Spontaneous bacterial peritonitis: Secondary | ICD-10-CM

## 2013-11-05 DIAGNOSIS — R809 Proteinuria, unspecified: Secondary | ICD-10-CM | POA: Diagnosis present

## 2013-11-05 DIAGNOSIS — K2960 Other gastritis without bleeding: Secondary | ICD-10-CM | POA: Diagnosis present

## 2013-11-05 DIAGNOSIS — E039 Hypothyroidism, unspecified: Secondary | ICD-10-CM | POA: Diagnosis present

## 2013-11-05 DIAGNOSIS — E873 Alkalosis: Secondary | ICD-10-CM

## 2013-11-05 DIAGNOSIS — R7402 Elevation of levels of lactic acid dehydrogenase (LDH): Secondary | ICD-10-CM | POA: Diagnosis present

## 2013-11-05 DIAGNOSIS — E538 Deficiency of other specified B group vitamins: Secondary | ICD-10-CM

## 2013-11-05 DIAGNOSIS — Z8249 Family history of ischemic heart disease and other diseases of the circulatory system: Secondary | ICD-10-CM

## 2013-11-05 LAB — COMPREHENSIVE METABOLIC PANEL
ALK PHOS: 179 U/L — AB (ref 39–117)
ALT: 24 U/L (ref 0–35)
AST: 111 U/L — AB (ref 0–37)
Albumin: 2.3 g/dL — ABNORMAL LOW (ref 3.5–5.2)
BILIRUBIN TOTAL: 4.2 mg/dL — AB (ref 0.3–1.2)
BUN: 36 mg/dL — ABNORMAL HIGH (ref 6–23)
CHLORIDE: 90 meq/L — AB (ref 96–112)
CO2: 23 mEq/L (ref 19–32)
Calcium: 8.7 mg/dL (ref 8.4–10.5)
Creatinine, Ser: 1.41 mg/dL — ABNORMAL HIGH (ref 0.50–1.10)
GFR calc Af Amer: 54 mL/min — ABNORMAL LOW (ref >90)
GFR calc non Af Amer: 46 mL/min — ABNORMAL LOW (ref >90)
Glucose, Bld: 114 mg/dL — ABNORMAL HIGH (ref 70–99)
Potassium: 4.1 mEq/L (ref 3.7–5.3)
SODIUM: 133 meq/L — AB (ref 137–147)
TOTAL PROTEIN: 7 g/dL (ref 6.0–8.3)

## 2013-11-05 LAB — URINALYSIS, ROUTINE W REFLEX MICROSCOPIC
GLUCOSE, UA: NEGATIVE mg/dL
Hgb urine dipstick: NEGATIVE
KETONES UR: 15 mg/dL — AB
Leukocytes, UA: NEGATIVE
NITRITE: NEGATIVE
PROTEIN: 30 mg/dL — AB
Specific Gravity, Urine: >1.03 — ABNORMAL HIGH (ref 1.005–1.030)
UROBILINOGEN UA: 4 mg/dL — AB (ref 0.0–1.0)
pH: 5 (ref 5.0–8.0)

## 2013-11-05 LAB — CBC WITH DIFFERENTIAL/PLATELET
BASOS ABS: 0 10*3/uL (ref 0.0–0.1)
Basophils Relative: 0 % (ref 0–1)
EOS PCT: 1 % (ref 0–5)
Eosinophils Absolute: 0.2 10*3/uL (ref 0.0–0.7)
HCT: 21.7 % — ABNORMAL LOW (ref 36.0–46.0)
Hemoglobin: 7.1 g/dL — ABNORMAL LOW (ref 12.0–15.0)
LYMPHS PCT: 12 % (ref 12–46)
Lymphs Abs: 2 10*3/uL (ref 0.7–4.0)
MCH: 36.2 pg — ABNORMAL HIGH (ref 26.0–34.0)
MCHC: 32.7 g/dL (ref 30.0–36.0)
MCV: 110.7 fL — ABNORMAL HIGH (ref 78.0–100.0)
MONOS PCT: 9 % (ref 3–12)
Monocytes Absolute: 1.5 10*3/uL — ABNORMAL HIGH (ref 0.1–1.0)
Neutro Abs: 13 10*3/uL — ABNORMAL HIGH (ref 1.7–7.7)
Neutrophils Relative %: 78 % — ABNORMAL HIGH (ref 43–77)
Platelets: 162 10*3/uL (ref 150–400)
RBC: 1.96 MIL/uL — ABNORMAL LOW (ref 3.87–5.11)
RDW: 24.9 % — AB (ref 11.5–15.5)
WBC: 16.7 10*3/uL — AB (ref 4.0–10.5)

## 2013-11-05 LAB — LACTIC ACID, PLASMA: LACTIC ACID, VENOUS: 3.3 mmol/L — AB (ref 0.5–2.2)

## 2013-11-05 LAB — URINE MICROSCOPIC-ADD ON

## 2013-11-05 LAB — PREPARE RBC (CROSSMATCH)

## 2013-11-05 LAB — AMMONIA: Ammonia: 77 umol/L — ABNORMAL HIGH (ref 11–60)

## 2013-11-05 LAB — PROTIME-INR
INR: 1.32 (ref 0.00–1.49)
Prothrombin Time: 16.1 seconds — ABNORMAL HIGH (ref 11.6–15.2)

## 2013-11-05 LAB — MAGNESIUM: MAGNESIUM: 1.2 mg/dL — AB (ref 1.5–2.5)

## 2013-11-05 LAB — MRSA PCR SCREENING: MRSA BY PCR: NEGATIVE

## 2013-11-05 LAB — APTT: APTT: 34 s (ref 24–37)

## 2013-11-05 MED ORDER — ONDANSETRON HCL 4 MG/2ML IJ SOLN: 4.0000 mg | Freq: Four times a day (QID) | INTRAMUSCULAR | Status: DC | PRN

## 2013-11-05 MED ORDER — FOLIC ACID 5 MG/ML IJ SOLN
INTRAMUSCULAR | Status: AC
  Filled 2013-11-05: qty 0.2

## 2013-11-05 MED ORDER — DEXTROSE 5 % IV SOLN
INTRAVENOUS | Status: AC
  Filled 2013-11-05: qty 10

## 2013-11-05 MED ORDER — DEXTROSE 5 % IV SOLN
1.0000 g | INTRAVENOUS | Status: DC
  Administered 2013-11-05: 1 g via INTRAVENOUS
  Filled 2013-11-05: qty 10

## 2013-11-05 MED ORDER — NOREPINEPHRINE BITARTRATE 1 MG/ML IJ SOLN: 0.0500 ug/kg/min | INTRAVENOUS | Status: DC

## 2013-11-05 MED ORDER — MAGNESIUM SULFATE 40 MG/ML IJ SOLN
2.0000 g | Freq: Once | INTRAMUSCULAR | Status: AC
  Administered 2013-11-05: 2 g via INTRAVENOUS
  Filled 2013-11-05: qty 50

## 2013-11-05 MED ORDER — THIAMINE HCL 100 MG/ML IJ SOLN
100.0000 mg | Freq: Every day | INTRAMUSCULAR | Status: DC
  Administered 2013-11-05 – 2013-11-06 (×2): 100 mg via INTRAVENOUS
  Filled 2013-11-05 (×2): qty 2

## 2013-11-05 MED ORDER — FOLIC ACID 5 MG/ML IJ SOLN
1.0000 mg | Freq: Every day | INTRAMUSCULAR | Status: DC
  Administered 2013-11-05 – 2013-11-06 (×2): 1 mg via INTRAVENOUS
  Filled 2013-11-05 (×4): qty 0.2

## 2013-11-05 MED ORDER — POTASSIUM CHLORIDE IN NACL 20-0.9 MEQ/L-% IV SOLN
INTRAVENOUS | Status: DC
  Administered 2013-11-05: 1000 mL via INTRAVENOUS
  Filled 2013-11-05: qty 1000

## 2013-11-05 MED ORDER — SODIUM CHLORIDE 0.9 % IJ SOLN
3.0000 mL | Freq: Two times a day (BID) | INTRAMUSCULAR | Status: DC
  Administered 2013-11-05 – 2013-11-16 (×13): 3 mL via INTRAVENOUS

## 2013-11-05 MED ORDER — SODIUM CHLORIDE 0.9 % IV SOLN
Freq: Once | INTRAVENOUS | Status: AC
  Administered 2013-11-05: 250 mL via INTRAVENOUS

## 2013-11-05 MED ORDER — ADULT MULTIVITAMIN W/MINERALS CH
1.0000 | ORAL_TABLET | Freq: Every day | ORAL | Status: DC
  Administered 2013-11-05 – 2013-11-17 (×13): 1 via ORAL
  Filled 2013-11-05 (×13): qty 1

## 2013-11-05 MED ORDER — ONDANSETRON HCL 4 MG PO TABS
4.0000 mg | ORAL_TABLET | Freq: Four times a day (QID) | ORAL | Status: DC | PRN
  Administered 2013-11-07: 4 mg via ORAL
  Filled 2013-11-05: qty 1

## 2013-11-05 MED ORDER — NOREPINEPHRINE BITARTRATE 1 MG/ML IJ SOLN
2.0000 ug/min | INTRAVENOUS | Status: DC
  Filled 2013-11-05: qty 4

## 2013-11-05 MED ORDER — ALUM & MAG HYDROXIDE-SIMETH 200-200-20 MG/5ML PO SUSP: 30.0000 mL | Freq: Four times a day (QID) | ORAL | Status: DC | PRN

## 2013-11-05 NOTE — H&P (Addendum)
[facility] History and Physical  Aolani Piggott UXN:[id number] DOB: [birth year] DOA: 11/05/2013  Referring physician: Rolland Porter, MD PCP: No PCP Per Patient   Chief Complaint: Swollen Legs  HPI: Wanda [name] is a 38 y.o. female who has been ion and out of the ED for similar complaints of swelling of her legs. This time she presents with weakness. She states that she is not able to stay standing for prolonged periods of time. She states that she has also noted that her legs have been more swollen of late. She states this has been going on for several months now. She notes that there is an increase in the girth of her abdomen. She has been taking torsemide for the past 3 weeks or so. When questioned about who she is getting these medications from she did not reall yknow. In the ED she has labs done showing elevation of her liver enzymes and also an ammonia level of 77. She has also been found to have a slight elevation of her creatinine from baseline and has a drop in her hemoglobin 7.1 from 8.7 on October 13 2013. On questioning she denies drinking alcohol. She states that she has never been told she might have liver disease. She states taht she has not been using any drugs and denies current smoking. She has no CP no palpitations noted.   Review of Systems:  Constitutional:  ++weight gain, no night sweats, Fevers, chills, ++fatigue.  HEENT:  No headaches, Sore throat,  Cardio-vascular:  No chest pain, ++Orthopnea, PND, ++swelling in lower extremities,  GI:  No heartburn, indigestion, ++abdominal pain, nausea, vomiting, ++diarrhea, loss of appetite  Resp:  ++ shortness of breath with exertion No wheezing.  Skin:  no rash or lesions.  GU:  ++change in color of urine, no frequency. No flank pain.  Musculoskeletal:  No joint pain or swelling. No decreased range of motion. No back pain.  Psych:  No change in mood or affect. No depression or anxiety. No memory loss.   Past Medical  History  Diagnosis Date  . GERD (gastroesophageal reflux disease)   . Liver disease 10/05/13    Unknwn etiology. Pt refused biopsy  . Acute renal failure 09/2013    Pre-renal- resolved  . Folate deficiency 09/2013  . Anasarca 10/10/2013   Past Surgical History  Procedure Laterality Date  . None     Social History:  reports that she has quit smoking. Her smoking use included Cigarettes. She has a 5 pack-year smoking history. She has never used smokeless tobacco. She reports that she does not drink alcohol or use illicit drugs.  No Known Allergies  Family History  Problem Relation Age of Onset  . Heart disease Mother   . Colon cancer Neg Hx   . Liver disease Neg Hx      Prior to Admission medications   Medication Sig Start Date End Date Taking? Authorizing Provider  potassium chloride SA (K-DUR,KLOR-CON) 20 MEQ tablet Take 1.5 tablets (30 mEq total) by mouth daily. DO NOT TAKE THIS MEDICATION IF YOU MISS YOUR TORSEMIDE DOSE. 10/13/13  Yes Rexene Alberts, MD  torsemide (DEMADEX) 10 MG tablet Take 50 mg by mouth daily. To decrease swelling   Yes Historical Provider, MD   Physical Exam: Filed Vitals:   11/05/13 1750  BP:   Pulse:   Temp: 98.1 F (36.7 C)  Resp:     BP 103/37  Pulse 130  Temp(Src) 98.1 F (36.7 C) (Oral)  Resp 21  Ht 5\' 2"  (1.575 m)  Wt 79.379 kg (175 lb)  BMI 32.00 kg/m2  SpO2 92%  LMP 09/12/2013  General:  Appears comfortable Eyes: PERRL, normal lids, ++icterus ENT: grossly normal hearing, lips & tongue Neck: no LAD, masses or thyromegaly Cardiovascular: RRR, ++gallop and heave. ++ LE edema. Telemetry: sinus tachycardia  Respiratory: CTA bilaterally, few basilar crackles Abdomen: soft, ntnd Skin: evidence of spiders and palmar erythema stigmata of chronic liver disease Musculoskeletal: grossly normal tone BUE/BLE Psychiatric: flat affect, appears anxious Neurologic: grossly non-focal. Gait was not checked          Labs on Admission:  Basic  Metabolic Panel:  Recent Labs Lab 11/05/13 1459  NA 133*  K 4.1  CL 90*  CO2 23  GLUCOSE 114*  BUN 36*  CREATININE 1.41*  CALCIUM 8.7  MG 1.2*   Liver Function Tests:  Recent Labs Lab 11/05/13 1459  AST 111*  ALT 24  ALKPHOS 179*  BILITOT 4.2*  PROT 7.0  ALBUMIN 2.3*   No results found for this basename: LIPASE, AMYLASE,  in the last 168 hours  Recent Labs Lab 11/05/13 1500  AMMONIA 77*   CBC:  Recent Labs Lab 11/05/13 1459  WBC 16.7*  NEUTROABS 13.0*  HGB 7.1*  HCT 21.7*  MCV 110.7*  PLT 162   Cardiac Enzymes: No results found for this basename: CKTOTAL, CKMB, CKMBINDEX, TROPONINI,  in the last 168 hours  BNP (last 3 results) No results found for this basename: PROBNP,  in the last 8760 hours CBG: No results found for this basename: GLUCAP,  in the last 168 hours  Radiological Exams on Admission: Ct Head Wo Contrast  11/05/2013   CLINICAL DATA:  Weakness.  Difficulty walking.  EXAM: CT HEAD WITHOUT CONTRAST  TECHNIQUE: Contiguous axial images were obtained from the base of the skull through the vertex without intravenous contrast.  COMPARISON:  None.  FINDINGS: No acute intracranial abnormality. Specifically, no hemorrhage, hydrocephalus, mass lesion, acute infarction, or significant intracranial injury. No acute calvarial abnormality. Visualized paranasal sinuses and mastoids clear. Orbital soft tissues unremarkable.  IMPRESSION: No acute intracranial abnormality.   Electronically Signed   By: Rolm Baptise M.D.   On: 11/05/2013 15:17     Assessment/Plan Principal Problem:   Acute blood loss anemia Active Problems:   Jaundice   Ascites   Dehydration   ARF (acute renal failure)   Transaminitis   Hepatopathy   1. Acute Anemia -she is tachycardic and currently BP is stable -due to tachycardia will transfuse PRBC now -Will get iron studies  2. Jaundice/Ascites/Hepatic failure/Elevated Transaminases -will check hepatitis panel -check HIV  status -will get [place] of liver in am -may consider workup for autoimmune hepatitis -also consider ceruloplasmin -consider spontaneous bacterial peritonitis due to elevated WBC -GI consult ordered  3. CHF -likely related to hyperdynamic state -will get an echo ordered for the morning -diuretics being held due to elevated creatinine  4. Elevated Creatinine -may be related intravascular depletion and use of torsemide  Code Status: Full Code (must indicate code status--if unknown or must be presumed, indicate so) Family Communication: No family Present (indicate person spoken with, if applicable, with phone number if by telephone) Disposition Plan: Home (indicate anticipated LOS)  Time spent: [place] 70 minutes  [facility] Pager [phone]

## 2013-11-05 NOTE — ED Provider Notes (Signed)
CSN: [id number]     Arrival date & time 11/05/13  1359 History   First MD Initiated Contact with Patient 11/05/13 1403     This chart was scribed for Wanda Norrie, MD by Forrestine Him, ED Scribe. This patient was seen in room APA18/APA18 and the patient's care was started 2:25 PM.   Chief Complaint  Patient presents with  . Fatigue   The history is provided by the patient. No language interpreter was used.     HPI Comments: Wanda [name] brought in by EMS is a 38 y.o. female who presents to the Emergency Department complaining of constant generalized weakness that started yesterday. She states she is so weak that yesterday she was going to the door to let someone in to help stay with her and she fell. She denies any injury from that fall. Pt also reports swelling to lower extremitites bilaterally onset a few months with diffuse swelling including in her hands.. Per EMS pt was recently started on Furosemide and pt has been taking this medication for 5 days now. Patient states she has been on torsemide for the past 3 weeks. She does not describe Lasix. Pt denies weighing herself daily to measure fluid intake. She states she is getting over bronchitis and has recently finished a course of Levaquin. She states her cough is improving. At this time she denies any fever, SOB, abdominal pain, abdominal swelling, nausea, SOB, vomiting, or diarrhea. She denies any headache or feeling confused. Pt states she stopped drinking alcohol about 5 years ago. Denies currently being a smoker.  Pt states she currently lives with her fiance.Pt is currently on disability for Bipolar Disorder. No other concerns this visit.   PCP Dr. Sandi Mariscal Dr. [facility]  Past Medical History  Diagnosis Date  . GERD (gastroesophageal reflux disease)   . Liver disease 10/05/13    Unknwn etiology. Pt refused biopsy  . Acute renal failure 09/2013    Pre-renal- resolved  . Folate deficiency 09/2013  . Anasarca 10/10/2013   Past Surgical  History  Procedure Laterality Date  . None     Family History  Problem Relation Age of Onset  . Heart disease Mother   . Colon cancer Neg Hx   . Liver disease Neg Hx    History  Substance Use Topics  . Smoking status: Former Smoker -- 0.25 packs/day for 20 years    Types: Cigarettes  . Smokeless tobacco: Never Used  . Alcohol Use: No  lives at home Lives with fiance Quit drinking 5 years ago On disability for bipolar disorder  OB History   Grav Para Term Preterm Abortions TAB SAB Ect Mult Living   1 1 1       1      Review of Systems  Constitutional: Negative for fever and chills.  HENT: Positive for congestion.   Respiratory: Positive for cough. Negative for shortness of breath.   Cardiovascular: Positive for leg swelling.  Gastrointestinal: Negative for nausea, vomiting and diarrhea.  Neurological: Positive for weakness.      Allergies  Review of patient's allergies indicates no known allergies.  Home Medications   Current Outpatient Rx  Name  Route  Sig  Dispense  Refill  . potassium chloride SA (K-DUR,KLOR-CON) 20 MEQ tablet   Oral   Take 1.5 tablets (30 mEq total) by mouth daily. DO NOT TAKE THIS MEDICATION IF YOU MISS YOUR TORSEMIDE DOSE.   45 tablet   0   . torsemide (DEMADEX)  10 MG tablet   Oral   Take 50 mg by mouth daily. To decrease swelling          Triage Vitals: BP 110/37  Pulse 121  Temp(Src) 97.6 F (36.4 C) (Oral)  Resp 18  Ht 5\' 2"  (1.575 m)  Wt 175 lb (79.379 kg)  BMI 32.00 kg/m2  SpO2 96%   Vital signs normal except tachycardia   Physical Exam  Nursing note and vitals reviewed. Constitutional: She is oriented to person, place, and time. She appears well-developed and well-nourished.  Non-toxic appearance. She does not appear ill. No distress.  HENT:  Head: Normocephalic and atraumatic.  Right Ear: External ear normal.  Left Ear: External ear normal.  Nose: Nose normal. No mucosal edema or rhinorrhea.  Mouth/Throat:  Oropharynx is clear and moist and mucous membranes are normal. No dental abscesses or uvula swelling.  Eyes: Conjunctivae and EOM are normal. Pupils are equal, round, and reactive to light. Scleral icterus is present.  Neck: Normal range of motion and full passive range of motion without pain. Neck supple.  Cardiovascular: Normal rate, regular rhythm and normal heart sounds.  Exam reveals no gallop and no friction rub.   No murmur heard. Pulmonary/Chest: Effort normal and breath sounds normal. No respiratory distress. She has no wheezes. She has no rhonchi. She has no rales. She exhibits no tenderness and no crepitus.  Abdominal: Soft. Normal appearance and bowel sounds are normal. She exhibits distension. There is no tenderness. There is no rebound and no guarding.  Abdomen appears distended but is soft  Musculoskeletal: Normal range of motion. She exhibits no edema and no tenderness.  Moves all extremities well.  Palmer erythema Asterixis  Pitting edema up to the knees bilaterally   Neurological: She is alert and oriented to person, place, and time. She has normal strength. No cranial nerve deficit.  + asterixis   Skin: Skin is warm, dry and intact. No rash noted. No erythema. No pallor.  Petechial  hemorrhaging on L wrist Dilated blood vessels on cheeks and nose c/w liver disease, palmer erythema c/w liver disease  Psychiatric: She has a normal mood and affect. Her speech is normal and behavior is normal. Her mood appears not anxious.    ED Course  Procedures (including critical care time)  Medications  magnesium sulfate IVPB 2 g 50 mL (0 g Intravenous Stopped 11/05/13 1740)     DIAGNOSTIC STUDIES: Oxygen Saturation is 96% on RA, adequate by my interpretation.    COORDINATION OF CARE: 2:35 PM- Will order Magnesium, CBC, comp. Metabolic panel, EKG, amnonia, APTT, PT-INR, and urnialysis. Discussed treatment plan with pt at bedside and pt agreed to plan.    Patient given 2 g of IV  magnesium for her low magnesium.  Review prior chart shows patient was admitted the first part of this month and received a blood transfusion when her hemoglobin dropped to the low 7 range. She normally is in the 8 range. She also was admitted in February when she had acute renal failure with her creatinine in the 3 range.  Patient had some tachycardia and her labs are consistent with some dehydration. However she has peripheral edema. Her IV fluid status will need to be managed very carefully so that the IV fluids do not go into her abdomen as ascites or worsening peripheral edema. This will be left to the discretion of the admitting doctors.  17:45 Dr Humphrey Rolls, will see patient and do admission orders.    Labs  Review Results for orders placed during the hospital encounter of 11/05/13  CBC WITH DIFFERENTIAL      Result Value Ref Range   WBC 16.7 (*) 4.0 - 10.5 K/uL   RBC 1.96 (*) 3.87 - 5.11 MIL/uL   Hemoglobin 7.1 (*) 12.0 - 15.0 g/dL   HCT 24.8 (*) 25.0 - 03.7 %   MCV 110.7 (*) 78.0 - 100.0 fL   MCH 36.2 (*) 26.0 - 34.0 pg   MCHC 32.7  30.0 - 36.0 g/dL   RDW 04.8 (*) 88.9 - 16.9 %   Platelets 162  150 - 400 K/uL   Neutrophils Relative % 78 (*) 43 - 77 %   Lymphocytes Relative 12  12 - 46 %   Monocytes Relative 9  3 - 12 %   Eosinophils Relative 1  0 - 5 %   Basophils Relative 0  0 - 1 %   Neutro Abs 13.0 (*) 1.7 - 7.7 K/uL   Lymphs Abs 2.0  0.7 - 4.0 K/uL   Monocytes Absolute 1.5 (*) 0.1 - 1.0 K/uL   Eosinophils Absolute 0.2  0.0 - 0.7 K/uL   Basophils Absolute 0.0  0.0 - 0.1 K/uL   RBC Morphology POLYCHROMASIA PRESENT     WBC Morphology TOXIC GRANULATION    COMPREHENSIVE METABOLIC PANEL      Result Value Ref Range   Sodium 133 (*) 137 - 147 mEq/L   Potassium 4.1  3.7 - 5.3 mEq/L   Chloride 90 (*) 96 - 112 mEq/L   CO2 23  19 - 32 mEq/L   Glucose, Bld 114 (*) 70 - 99 mg/dL   BUN 36 (*) 6 - 23 mg/dL   Creatinine, Ser 4.50 (*) 0.50 - 1.10 mg/dL   Calcium 8.7  8.4 - 38.8 mg/dL    Total Protein 7.0  6.0 - 8.3 g/dL   Albumin 2.3 (*) 3.5 - 5.2 g/dL   AST 828 (*) 0 - 37 U/L   ALT 24  0 - 35 U/L   Alkaline Phosphatase 179 (*) 39 - 117 U/L   Total Bilirubin 4.2 (*) 0.3 - 1.2 mg/dL   GFR calc non Af Amer 46 (*) >90 mL/min   GFR calc Af Amer 54 (*) >90 mL/min  AMMONIA      Result Value Ref Range   Ammonia 77 (*) 11 - 60 umol/L  APTT      Result Value Ref Range   aPTT 34  24 - 37 seconds  PROTIME-INR      Result Value Ref Range   Prothrombin Time 16.1 (*) 11.6 - 15.2 seconds   INR 1.32  0.00 - 1.49  URINALYSIS, ROUTINE W REFLEX MICROSCOPIC      Result Value Ref Range   Color, Urine BROWN (*) YELLOW   APPearance CLOUDY (*) CLEAR   Specific Gravity, Urine >1.030 (*) 1.005 - 1.030   pH 5.0  5.0 - 8.0   Glucose, UA NEGATIVE  NEGATIVE mg/dL   Hgb urine dipstick NEGATIVE  NEGATIVE   Bilirubin Urine LARGE (*) NEGATIVE   Ketones, ur 15 (*) NEGATIVE mg/dL   Protein, ur 30 (*) NEGATIVE mg/dL   Urobilinogen, UA 4.0 (*) 0.0 - 1.0 mg/dL   Nitrite NEGATIVE  NEGATIVE   Leukocytes, UA NEGATIVE  NEGATIVE  MAGNESIUM      Result Value Ref Range   Magnesium 1.2 (*) 1.5 - 2.5 mg/dL  URINE MICROSCOPIC-ADD ON      Result Value Ref Range  Squamous Epithelial / LPF MANY (*) RARE   WBC, UA 3-6  <3 WBC/hpf   Bacteria, UA MANY (*) RARE   Urine-Other AMORPHOUS URATES/PHOSPHATES     Laboratory interpretation all normal except worsening of her chronic anemia, new renal insufficiency since the beginning of the month, low magnesium, bilirubin in the urine, elevated total bilirubin with minor elevation of her LFTs, elevated ammonia compared to her baseline, leukocytosis   Imaging Review Ct Head Wo Contrast  11/05/2013   CLINICAL DATA:  Weakness.  Difficulty walking.  EXAM: CT HEAD WITHOUT CONTRAST  TECHNIQUE: Contiguous axial images were obtained from the base of the skull through the vertex without intravenous contrast.  COMPARISON:  None.  FINDINGS: No acute intracranial abnormality.  Specifically, no hemorrhage, hydrocephalus, mass lesion, acute infarction, or significant intracranial injury. No acute calvarial abnormality. Visualized paranasal sinuses and mastoids clear. Orbital soft tissues unremarkable.  IMPRESSION: No acute intracranial abnormality.   Electronically Signed   By: Rolm Baptise M.D.   On: 11/05/2013 15:17     EKG Interpretation   Date/Time:  Sunday November 05 2013 14:05:01 EDT Ventricular Rate:  121 PR Interval:  138 QRS Duration: 74 QT Interval:  340 QTC Calculation: 482 R Axis:   69 Text Interpretation:  Sinus tachycardia Nonspecific ST and T wave  abnormality Abnormal ECG No previous ECGs available Confirmed by Wilson Singer   MD, STEPHEN ([id number]) on 11/05/2013 2:10:18 PM      MDM   Final diagnoses:  Anemia  Hepatic encephalopathy  Peripheral edema  Acute renal insufficiency    I personally performed the services described in this documentation, which was scribed in my presence. The recorded information has been reviewed and considered.  Rolland Porter, MD, FACEP   Wanda Norrie, MD 11/05/13 [facility] [place]

## 2013-11-05 NOTE — ED Notes (Signed)
Pt pulled up in bed, O2 applied 2lnc. Mentation same with no complaints

## 2013-11-05 NOTE — ED Notes (Addendum)
Discussed pt v/s with EDP. No new orders given at this time.

## 2013-11-05 NOTE — ED Notes (Addendum)
Per EMS, pt reports generalized weakness that began yesterday. Pt also reports BLE pain. Per EMS, pt was recently started on potassium and furosemide. pt reports has been taking 5 "fluid pills a day" for BLE edema. Pt alert and oriented. Pt denies any sob,cp,n/v/d. Pt jaundice,sclera yellow. Pt reports was seen for edema and productive cough recently with no relief.

## 2013-11-05 NOTE — ED Notes (Signed)
Pt oxygen saturation on room air 88-93%. Pt placed on 2 liters for comfort.

## 2013-11-06 ENCOUNTER — Inpatient Hospital Stay (HOSPITAL_COMMUNITY): Payer: Medicare Other

## 2013-11-06 ENCOUNTER — Encounter (HOSPITAL_COMMUNITY): Payer: Self-pay | Admitting: Gastroenterology

## 2013-11-06 DIAGNOSIS — R188 Other ascites: Secondary | ICD-10-CM

## 2013-11-06 DIAGNOSIS — I517 Cardiomegaly: Secondary | ICD-10-CM

## 2013-11-06 DIAGNOSIS — N289 Disorder of kidney and ureter, unspecified: Secondary | ICD-10-CM

## 2013-11-06 DIAGNOSIS — K7290 Hepatic failure, unspecified without coma: Secondary | ICD-10-CM

## 2013-11-06 DIAGNOSIS — D649 Anemia, unspecified: Secondary | ICD-10-CM

## 2013-11-06 DIAGNOSIS — D62 Acute posthemorrhagic anemia: Secondary | ICD-10-CM

## 2013-11-06 DIAGNOSIS — R609 Edema, unspecified: Secondary | ICD-10-CM

## 2013-11-06 DIAGNOSIS — R740 Nonspecific elevation of levels of transaminase and lactic acid dehydrogenase [LDH]: Secondary | ICD-10-CM

## 2013-11-06 DIAGNOSIS — K7682 Hepatic encephalopathy: Secondary | ICD-10-CM

## 2013-11-06 DIAGNOSIS — R7401 Elevation of levels of liver transaminase levels: Secondary | ICD-10-CM

## 2013-11-06 LAB — HIV ANTIBODY (ROUTINE TESTING W REFLEX): HIV: NONREACTIVE

## 2013-11-06 LAB — RAPID URINE DRUG SCREEN, HOSP PERFORMED
Amphetamines: NOT DETECTED
Barbiturates: NOT DETECTED
Benzodiazepines: NOT DETECTED
Cocaine: NOT DETECTED
OPIATES: NOT DETECTED
TETRAHYDROCANNABINOL: NOT DETECTED

## 2013-11-06 LAB — HEPATITIS PANEL, ACUTE
HCV Ab: NEGATIVE
HEP A IGM: NONREACTIVE
Hep B C IgM: NONREACTIVE
Hepatitis B Surface Ag: NEGATIVE

## 2013-11-06 LAB — COMPREHENSIVE METABOLIC PANEL
ALT: 24 U/L (ref 0–35)
AST: 105 U/L — AB (ref 0–37)
Albumin: 2.1 g/dL — ABNORMAL LOW (ref 3.5–5.2)
Alkaline Phosphatase: 168 U/L — ABNORMAL HIGH (ref 39–117)
BUN: 51 mg/dL — ABNORMAL HIGH (ref 6–23)
CALCIUM: 8.6 mg/dL (ref 8.4–10.5)
CO2: 29 mEq/L (ref 19–32)
CREATININE: 1.56 mg/dL — AB (ref 0.50–1.10)
Chloride: 94 mEq/L — ABNORMAL LOW (ref 96–112)
GFR calc Af Amer: 47 mL/min — ABNORMAL LOW (ref >90)
GFR calc non Af Amer: 41 mL/min — ABNORMAL LOW (ref >90)
Glucose, Bld: 114 mg/dL — ABNORMAL HIGH (ref 70–99)
Potassium: 4.3 mEq/L (ref 3.7–5.3)
Sodium: 136 mEq/L — ABNORMAL LOW (ref 137–147)
TOTAL PROTEIN: 6.6 g/dL (ref 6.0–8.3)
Total Bilirubin: 6.3 mg/dL — ABNORMAL HIGH (ref 0.3–1.2)

## 2013-11-06 LAB — IRON AND TIBC
Iron: 142 ug/dL — ABNORMAL HIGH (ref 42–135)
Iron: 127 ug/dL (ref 42–135)
Saturation Ratios: 85 % — ABNORMAL HIGH (ref 20–55)
TIBC: 149 ug/dL — ABNORMAL LOW (ref 250–470)
UIBC: <15 ug/dL — ABNORMAL LOW (ref 125–400)
UIBC: 22 ug/dL — ABNORMAL LOW (ref 125–400)

## 2013-11-06 LAB — RETICULOCYTES
RBC.: 2.31 MIL/uL — ABNORMAL LOW (ref 3.87–5.11)
RETIC COUNT ABSOLUTE: 83.2 10*3/uL (ref 19.0–186.0)
Retic Ct Pct: 3.6 % — ABNORMAL HIGH (ref 0.4–3.1)

## 2013-11-06 LAB — CBC
HCT: 25.7 % — ABNORMAL LOW (ref 36.0–46.0)
HEMOGLOBIN: 8.7 g/dL — AB (ref 12.0–15.0)
MCH: 35.2 pg — AB (ref 26.0–34.0)
MCHC: 33.9 g/dL (ref 30.0–36.0)
MCV: 104 fL — ABNORMAL HIGH (ref 78.0–100.0)
Platelets: 146 10*3/uL — ABNORMAL LOW (ref 150–400)
RBC: 2.47 MIL/uL — ABNORMAL LOW (ref 3.87–5.11)
RDW: 25.5 % — ABNORMAL HIGH (ref 11.5–15.5)
WBC: 16.1 10*3/uL — ABNORMAL HIGH (ref 4.0–10.5)

## 2013-11-06 LAB — HEPATITIS B CORE ANTIBODY, TOTAL: Hep B Core Total Ab: NONREACTIVE

## 2013-11-06 LAB — BILIRUBIN, FRACTIONATED(TOT/DIR/INDIR)
BILIRUBIN DIRECT: 3.7 mg/dL — AB (ref 0.0–0.3)
Indirect Bilirubin: 2.4 mg/dL — ABNORMAL HIGH (ref 0.3–0.9)
Total Bilirubin: 6.1 mg/dL — ABNORMAL HIGH (ref 0.3–1.2)

## 2013-11-06 LAB — FERRITIN: FERRITIN: 590 ng/mL — AB (ref 10–291)

## 2013-11-06 LAB — ACETAMINOPHEN LEVEL: Acetaminophen (Tylenol), Serum: <15 ug/mL (ref 10–30)

## 2013-11-06 LAB — PROTIME-INR
INR: 1.4 (ref 0.00–1.49)
Prothrombin Time: 16.8 seconds — ABNORMAL HIGH (ref 11.6–15.2)

## 2013-11-06 LAB — HEPATITIS C ANTIBODY (REFLEX): HCV Ab: NEGATIVE

## 2013-11-06 LAB — APTT: aPTT: 35 seconds (ref 24–37)

## 2013-11-06 MED ORDER — OXYCODONE HCL 5 MG PO TABS
5.0000 mg | ORAL_TABLET | Freq: Four times a day (QID) | ORAL | Status: DC | PRN
  Administered 2013-11-06 (×2): 5 mg via ORAL
  Filled 2013-11-06 (×3): qty 1

## 2013-11-06 MED ORDER — NADOLOL 20 MG PO TABS
20.0000 mg | ORAL_TABLET | Freq: Every day | ORAL | Status: DC
  Administered 2013-11-06: 20 mg via ORAL
  Filled 2013-11-06 (×3): qty 1

## 2013-11-06 MED ORDER — PIPERACILLIN-TAZOBACTAM 3.375 G IVPB
3.3750 g | Freq: Three times a day (TID) | INTRAVENOUS | Status: DC
  Administered 2013-11-06 – 2013-11-08 (×6): 3.375 g via INTRAVENOUS
  Filled 2013-11-06 (×7): qty 50

## 2013-11-06 MED ORDER — PANTOPRAZOLE SODIUM 40 MG PO TBEC
40.0000 mg | DELAYED_RELEASE_TABLET | Freq: Every day | ORAL | Status: DC
  Administered 2013-11-07 – 2013-11-17 (×10): 40 mg via ORAL
  Filled 2013-11-06 (×12): qty 1

## 2013-11-06 MED ORDER — ENSURE COMPLETE PO LIQD
237.0000 mL | Freq: Two times a day (BID) | ORAL | Status: DC
  Administered 2013-11-06: 16:00:00 via ORAL
  Administered 2013-11-07 – 2013-11-17 (×10): 237 mL via ORAL

## 2013-11-06 MED ORDER — SODIUM CHLORIDE 0.9 % IV SOLN
INTRAVENOUS | Status: DC
  Administered 2013-11-06 – 2013-11-07 (×2): via INTRAVENOUS

## 2013-11-06 MED ORDER — LACTULOSE 10 GM/15ML PO SOLN
10.0000 g | Freq: Two times a day (BID) | ORAL | Status: DC
  Administered 2013-11-06 – 2013-11-07 (×3): 10 g via ORAL
  Filled 2013-11-06 (×3): qty 30

## 2013-11-06 MED ORDER — SODIUM CHLORIDE 0.9 % IV BOLUS (SEPSIS)
1000.0000 mL | Freq: Once | INTRAVENOUS | Status: AC
  Administered 2013-11-06: 1000 mL via INTRAVENOUS

## 2013-11-06 MED ORDER — VANCOMYCIN HCL IN DEXTROSE 750-5 MG/150ML-% IV SOLN
750.0000 mg | Freq: Two times a day (BID) | INTRAVENOUS | Status: DC
  Administered 2013-11-06 – 2013-11-07 (×3): 750 mg via INTRAVENOUS
  Filled 2013-11-06 (×4): qty 150

## 2013-11-06 MED ORDER — VANCOMYCIN HCL 10 G IV SOLR
1500.0000 mg | Freq: Once | INTRAVENOUS | Status: AC
  Administered 2013-11-06: 1500 mg via INTRAVENOUS
  Filled 2013-11-06: qty 1500

## 2013-11-06 NOTE — Plan of Care (Signed)
Problem: Consults Goal: General Medical Patient Education See Patient Education Module for specific education. Outcome: Progressing Admitted with anemia, 2 units of blood to be administered, Knee high pitting edema +2 calve, +3 feet bilaterally.  Congested cough.  First unit being administered at present, will monitor closely for fluid overload Goal: Skin Care Protocol Initiated - if Braden Score 18 or less If consults are not indicated, leave blank or document N/A Outcome: Progressing Multiple bruising to left side, right palm of hand, back of right thigh, patient admitted to multiple falls at home prior to admission. Uses walker at home.  Problem: Phase I Progression Outcomes Goal: Pain controlled with appropriate interventions Outcome: Progressing Need to contact md for pain management, lower extremity aching  Goal: OOB as tolerated unless otherwise ordered Outcome: Not Progressing Strict bedrest for hypotension Goal: Initial discharge plan identified Outcome: [place] with fiance [name] Goal: Hemodynamically stable Outcome: Progressing Hypotensive, and tachycardic

## 2013-11-06 NOTE — Consult Note (Addendum)
Referring Provider: Dr. Devona Konig Primary Care Physician:  No PCP Per Patient Primary Gastroenterologist:  Dr. Oneida Alar   Date of Admission: 11/05/13 Date of Consultation: 11/06/13  Reason for Consultation:  Probable cirrhosis  HPI:  Wanda [name] is a 38 year old female who is well known from recent hospitalizations secondary to anasarca, elevated LFTs, and acute renal failure. At that time, she was evaluated extensively for possible etiology to underlying liver disease, to include 24 hour urinary copper minimally elevated without other positive parameters for Wilson's disease. Negative viral markers, negative autoimmune, normal celiac serologies Feb 2015. Paracentesis Feb 2015 with 1180 ml ascitic fluid removed. No significant findings on fluid analysis, but her SAAG score was 1.6 indicating likely portal hypertension.  She had continued to refuse liver biopsy during last hospitalization. There was concern for occult ETOH use, but patient adamantly denied after repeated questioning. She did not show for her outpatient follow-up appointment with our office, and we were unable to reach her despite multiple attempts from our office.   Admitted with Hgb 7.1, which is close to her baseline from prior admission. 2 units PRBCs transfused with improvement to 8.7 . States she doesn't know why she missed our appointment as outpatient. Presented to the ED due to lower extremity edema. Denies abdominal pain. No N/V. Good appetite. Denies confusion. "just sleepy right now". Notes pain in lower extremities and feet. Still thinking about liver biopsy and not sure if she wants to proceed. Denies constipation, diarrhea. No hematochezia or melena. Good appetite. Demadex 10 mg at home daily. Weight 172 last admission. Admitting weight 175.    Past Medical History  Diagnosis Date  . GERD (gastroesophageal reflux disease)   . Liver disease 10/05/13    Unknwn etiology. Pt refused biopsy  . Acute renal failure  09/2013    Pre-renal- resolved  . Folate deficiency 09/2013  . Anasarca 10/10/2013    Past Surgical History  Procedure Laterality Date  . None    . Paracentesis  Feb 2015    1180 fluid, negative fluid analysis.     Prior to Admission medications   Medication Sig Start Date End Date Taking? Authorizing Provider  potassium chloride SA (K-DUR,KLOR-CON) 20 MEQ tablet Take 1.5 tablets (30 mEq total) by mouth daily. DO NOT TAKE THIS MEDICATION IF YOU MISS YOUR TORSEMIDE DOSE. 10/13/13  Yes Rexene Alberts, MD  torsemide (DEMADEX) 10 MG tablet Take 50 mg by mouth daily. To decrease swelling   Yes Historical Provider, MD    Current Facility-Administered Medications  Medication Dose Route Frequency Provider Last Rate Last Dose  . 0.9 % NaCl with KCl 20 mEq/ L  infusion   Intravenous Continuous Allyne Gee, MD 50 mL/hr at 11/06/13 1000    . alum & mag hydroxide-simeth (MAALOX/MYLANTA) 200-200-20 MG/5ML suspension 30 mL  30 mL Oral Q6H PRN Allyne Gee, MD      . folic acid injection 1 mg  1 mg Intravenous Daily Allyne Gee, MD   1 mg at 11/06/13 1103  . multivitamin with minerals tablet 1 tablet  1 tablet Oral Daily Allyne Gee, MD   1 tablet at 11/06/13 1103  . norepinephrine (LEVOPHED) 4 mg in dextrose 5 % 250 mL infusion  2-50 mcg/min Intravenous Titrated Allyne Gee, MD      . ondansetron [facility]) tablet 4 mg  4 mg Oral Q6H PRN Allyne Gee, MD       Or  . ondansetron [facility]) injection 4 mg  4 mg Intravenous Q6H PRN Allyne Gee, MD      . oxyCODONE (Oxy IR/ROXICODONE) immediate release tablet 5 mg  5 mg Oral Q6H PRN Gardiner Barefoot, NP   5 mg at 11/06/13 0113  . piperacillin-tazobactam (ZOSYN) IVPB 3.375 g  3.375 g Intravenous Q8H Domenic Polite, MD   3.375 g at 11/06/13 1102  . sodium chloride 0.9 % injection 3 mL  3 mL Intravenous Q12H Allyne Gee, MD   3 mL at 11/05/13 2227  . thiamine (B-1) injection 100 mg  100 mg Intravenous Daily Allyne Gee, MD   100 mg at 11/06/13  1103  . vancomycin (VANCOCIN) 1,500 mg in sodium chloride 0.9 % 500 mL IVPB  1,500 mg Intravenous Once Domenic Polite, MD   1,500 mg at 11/06/13 1104  . vancomycin (VANCOCIN) IVPB 750 mg/150 ml premix  750 mg Intravenous Q12H Domenic Polite, MD        Allergies as of 11/05/2013  . (No Known Allergies)    Family History  Problem Relation Age of Onset  . Heart disease Mother   . Colon cancer Neg Hx   . Liver disease Neg Hx     History   Social History  . Marital Status: Single    Spouse Name: N/A    Number of Children: 1  . Years of Education: N/A   Occupational History  . unemployed    Social History Main Topics  . Smoking status: Former Smoker -- 0.25 packs/day for 20 years    Types: Cigarettes    Quit date: 11/05/2008  . Smokeless tobacco: Never Used  . Alcohol Use: No  . Drug Use: No  . Sexual Activity: Yes    Partners: Male    Birth Control/ Protection: None   Other Topics Concern  . Not on file   Social History Narrative  . No narrative on file    Review of Systems: Gen: Denies fever, chills, loss of appetite, change in weight or weight loss CV: Denies chest pain, heart palpitations, syncope, edema  Resp: Denies shortness of breath with rest, cough, wheezing GI: see HPI GU : Denies urinary burning, urinary frequency, urinary incontinence.  MS: see HPI Derm: Denies rash, itching, dry skin Psych: Denies depression, anxiety,confusion, or memory loss Heme: Denies bruising, bleeding, and enlarged lymph nodes.  Physical Exam: Vital signs in last 24 hours: Temp:  [97.5 F (36.4 C)-99.1 F (37.3 C)] 97.5 F (36.4 C) (03/30 0732) Pulse Rate:  [121-133] 124 (03/30 1000) Resp:  [18-44] 23 (03/30 1000) BP: (86-133)/(20-118) 125/55 mmHg (03/30 1000) SpO2:  [91 %-99 %] 99 % (03/30 1000) Weight:  [165 lb 5.5 oz (75 kg)-175 lb (79.379 kg)] 170 lb 10.2 oz (77.4 kg) (03/30 0530) Last BM Date: 11/06/13 General:   Alert,  Appears older than stated age.  Jaundiced Head:  Normocephalic and atraumatic. Eyes:  Scleral icterus Ears:  Normal auditory acuity. Nose:  No deformity, discharge,  or lesions. Mouth:  No deformity or lesions, dentition normal. Neck:  Supple; no masses or thyromegaly. Lungs:  Clear throughout to auscultation.   No wheezes, crackles, or rhonchi. No acute distress. Heart:  S1 S2 present, tachycardic Abdomen:  +BS, distended but soft, +fluid wave, non-tense ascites. Hepatomegaly on exam Rectal:  Deferred until time of colonoscopy.   Msk:  Symmetrical without gross deformities. Normal posture. Extremities:  Trace lower extremity edema Neurologic:  Alert and  oriented x4;  Mild asterixis Skin:  Intact without significant lesions or  rashes. Cervical Nodes:  No significant cervical adenopathy. Psych:  Alert and cooperative. Flat affect  Intake/Output from previous day: 03/29 0701 - 03/30 0700 In: 2275 [I.V.:1555; Blood:670; IV Piggyback:50] Out: 150 [Urine:150] Intake/Output this shift: Total I/O In: 150 [I.V.:150] Out: 100 [Urine:100]  Lab Results:  Recent Labs  11/05/13 1459 11/06/13 0646  WBC 16.7* 16.1*  HGB 7.1* 8.7*  HCT 21.7* 25.7*  PLT 162 146*   BMET  Recent Labs  11/05/13 1459 11/06/13 0646  NA 133* 136*  K 4.1 4.3  CL 90* 94*  CO2 23 29  GLUCOSE 114* 114*  BUN 36* 51*  CREATININE 1.41* 1.56*  CALCIUM 8.7 8.6   LFT  Recent Labs  11/05/13 1459 11/06/13 0646  PROT 7.0 6.6  ALBUMIN 2.3* 2.1*  AST 111* 105*  ALT 24 24  ALKPHOS 179* 168*  BILITOT 4.2* 6.3*   PT/INR  Recent Labs  11/05/13 1459 11/06/13 0646  LABPROT 16.1* 16.8*  INR 1.32 1.40    Studies/Results: Ct Head Wo Contrast  11/05/2013   CLINICAL DATA:  Weakness.  Difficulty walking.  EXAM: CT HEAD WITHOUT CONTRAST  TECHNIQUE: Contiguous axial images were obtained from the base of the skull through the vertex without intravenous contrast.  COMPARISON:  None.  FINDINGS: No acute intracranial abnormality.  Specifically, no hemorrhage, hydrocephalus, mass lesion, acute infarction, or significant intracranial injury. No acute calvarial abnormality. Visualized paranasal sinuses and mastoids clear. Orbital soft tissues unremarkable.  IMPRESSION: No acute intracranial abnormality.   Electronically Signed   By: Rolm Baptise M.D.   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 Previously has refused liver biopsy without supporting reason. Recurrent ascites noted but doubt dealing with SBP. Agree with paracentesis and fluid analysis. Mildly elevated ammonia, patient with mild asterixis on exam. Start Lactulose dosing.   Persistent anemia without overt signs of GI bleeding. Improved with 2 units PRBCs; ultimately needs colonoscopy with EGD as outpatient due to  heme positive stool and variceal screening.   Leukocytosis: blood cultures obtained, send ascitic fluid for analysis.  Elevated LFTs: ultimately needs liver biopsy. Still concern for occult ETOH abuse. Tbili 6.3, check fractionated bilirubin. Options limited for management in this patient if she continues to decline liver biopsy.    Plan: Lactulose titration to achieve 2-3 soft BMs daily PPI for GI prophylaxis US paracentesis today with fluid analysis Liver biopsy still recommended; patient declines currently 2 gram Na diet after [place] completed Monitor H/H Outpatient colonoscopy/EGD Check fractionated bilirubin, repeat HFP in am  Orvil Feil, ANP-BC [facility]      LOS: 1 day    11/06/2013, 11:42 AM    Addendum at 1320: fluid analysis not completed as orders were placed after fluid already drawn and discarded. Verified this with  radiology.  Orvil Feil, ANP-BC [facility]    Attending note:  Patient seen and examined this evening. Agree with above assessment and recommendations as outlined. Dr. Oneida Alar to see tomorrow.

## 2013-11-06 NOTE — Progress Notes (Signed)
Patient had not voided since 1600 in the ED.  At [facility] bladder scanned and 566cc showed.  This was probably ascites in retrospect. But at the time suprapubic was firm and patient had been trying to void on bedpan.  Notified Mid level MD for catheter placement due to urinary retention/Acute critical 24 hrs.  Only 150cc brown/amber urine noted this am

## 2013-11-06 NOTE — Progress Notes (Signed)
MD notified of patient's sustained heart rate at 120.  New orders received and carried out.  Will continue to monitor. Schonewitz, Eulis Canner 11/06/2013

## 2013-11-06 NOTE — Progress Notes (Signed)
INITIAL NUTRITION ASSESSMENT  DOCUMENTATION CODES Per approved criteria  -Obesity Unspecified   INTERVENTION: Ensure Complete po BID, each supplement provides 350 kcal and 13 grams of protein  NUTRITION DIAGNOSIS: Inadequate oral intake related to decreased appetite as evidenced by miimal PO intake.   Goal: Pt will meet >90% of estimated nutritional needs  Monitor:  PO intake, diet advancement, skin assessments, labs, weight changes, I/O's  Reason for Assessment: MST=2  38 y.o. female  Admitting Dx: Acute blood loss anemia  ASSESSMENT: Pt admitted with anemia and hepatic encephalopathy. Pt with admission in February 2015, where she underwent extensive work-up for liver disease, however, pt refused liver biopsy. She also was noncompliant with outpatient GI follow-up.  Wt hx reveals 2# (1.2%) wt loss x 1 month, which is not significant.  Pt is currently on a full liquid diet. Noted minimal intake of lunch tray. Pt was unavailable at time of visit, due to paracentesis.  Pt does not meet criteria for malnutrition at this time, however, is at risk given multiple medical problems and poor po intake.   Height: Ht Readings from Last 1 Encounters:  11/05/13 5\' 2"  (1.575 m)    Weight: Wt Readings from Last 1 Encounters:  11/06/13 170 lb 10.2 oz (77.4 kg)    Ideal Body Weight: 110#  % Ideal Body Weight: 155%  Wt Readings from Last 10 Encounters:  11/06/13 170 lb 10.2 oz (77.4 kg)  10/12/13 172 lb (78.019 kg)  09/11/13 177 lb 11.2 oz (80.604 kg)    Usual Body Weight: 177#  % Usual Body Weight: 96%  BMI:  Body mass index is 31.2 kg/(m^2). Meets criteria for obesity, class I.   Estimated Nutritional Needs: Kcal: 2247-2262 daily Protein: 93-116 grams daily Fluid: 2.2-2.6 L daily  Skin: Intact  Diet Order: Full Liquid  EDUCATION NEEDS: -Education not appropriate at this time   Intake/Output Summary (Last 24 hours) at 11/06/13 1310 Last data filed at 11/06/13  1300  Gross per 24 hour  Intake   3125 ml  Output    250 ml  Net   2875 ml    Last BM: 11/06/13   Labs:   Recent Labs Lab 11/05/13 1459 11/06/13 0646  NA 133* 136*  K 4.1 4.3  CL 90* 94*  CO2 23 29  BUN 36* 51*  CREATININE 1.41* 1.56*  CALCIUM 8.7 8.6  MG 1.2*  --   GLUCOSE 114* 114*    CBG (last 3)  No results found for this basename: GLUCAP,  in the last 72 hours  Scheduled Meds: . folic acid  1 mg Intravenous Daily  . lactulose  10 g Oral BID  . multivitamin with minerals  1 tablet Oral Daily  . pantoprazole  40 mg Oral Daily  . piperacillin-tazobactam (ZOSYN)  IV  3.375 g Intravenous Q8H  . sodium chloride  3 mL Intravenous Q12H  . thiamine  100 mg Intravenous Daily  . vancomycin  750 mg Intravenous Q12H    Continuous Infusions: . 0.9 % NaCl with KCl 20 mEq / L 50 mL/hr at 11/06/13 1300  . norepinephrine (LEVOPHED) Adult infusion      Past Medical History  Diagnosis Date  . GERD (gastroesophageal reflux disease)   . Liver disease 10/05/13    Unknwn etiology. Pt refused biopsy  . Acute renal failure 09/2013    Pre-renal- resolved  . Folate deficiency 09/2013  . Anasarca 10/10/2013    Past Surgical History  Procedure Laterality Date  . None    .  Paracentesis  Feb 2015    1180 fluid, negative fluid analysis.     Shray Hunley A. Jimmye Norman, RD, LDN Pager: [phone]

## 2013-11-06 NOTE — Progress Notes (Signed)
Paracentesis complete no signs of distress. 2000 ml red colored abdominal fluid removed.

## 2013-11-06 NOTE — Progress Notes (Signed)
UR chart review completed.  

## 2013-11-06 NOTE — Progress Notes (Signed)
[facility] PROGRESS NOTE  Wanda [name] BDZ:[id number] DOB: Aug 10, 1976 DOA: 11/05/2013 PCP: No PCP Per Patient  Assessment/Plan: 1. Anemia -likely due to chronic disease -no overt bleeding per pt -GI following -1unit PRBC transfused 3/29 -check anemia panel  2. SIRS/early sepsis -lactic acid up -check blood Cx -empiric Abx for now -agree with US paracentesis to r/o SBP  3. Mild hepatic encephalopathy -agree with lactulose  4. Cirrhosis of unclear etiology -extensive workup recently no cause found -she declined biopsy last admission, have asked her to reconsider this now  DVT proph: SCDs  Code Status: Full COde Family Communication: no family at bedside Disposition Plan: keep in SDU today   Consultants:  GI  Antibiotics:  Vanc/Zosyn  HPI/Subjective: Feels ok, but weak, no specific symptoms and very poor historian, didn't FU with GI after recent discharge   Objective: Filed Vitals:   11/06/13 1300  BP:   Pulse: 121  Temp:   Resp: 23    Intake/Output Summary (Last 24 hours) at 11/06/13 1307 Last data filed at 11/06/13 1300  Gross per 24 hour  Intake   3125 ml  Output    250 ml  Net   2875 ml   Filed Weights   11/05/13 1403 11/05/13 2146 11/06/13 0530  Weight: 79.379 kg (175 lb) 75 kg (165 lb 5.5 oz) 77.4 kg (170 lb 10.2 oz)    Exam:   General:  AAOx3, no distress  Cardiovascular: S1S2/RRR  Respiratory: CTAB  Abdomen: soft, obese, distended, fluid thrill noted, non tender  Musculoskeletal: positive edema no c/c  Data Reviewed: Basic Metabolic Panel:  Recent Labs Lab 11/05/13 1459 11/06/13 0646  NA 133* 136*  K 4.1 4.3  CL 90* 94*  CO2 23 29  GLUCOSE 114* 114*  BUN 36* 51*  CREATININE 1.41* 1.56*  CALCIUM 8.7 8.6  MG 1.2*  --    Liver Function Tests:  Recent Labs Lab 11/05/13 1459 11/06/13 0646  AST 111* 105*  ALT 24 24  ALKPHOS 179* 168*  BILITOT 4.2* 6.3*  PROT 7.0 6.6  ALBUMIN 2.3* 2.1*   No results found  for this basename: LIPASE, AMYLASE,  in the last 168 hours  Recent Labs Lab 11/05/13 1500  AMMONIA 77*   CBC:  Recent Labs Lab 11/05/13 1459 11/06/13 0646  WBC 16.7* 16.1*  NEUTROABS 13.0*  --   HGB 7.1* 8.7*  HCT 21.7* 25.7*  MCV 110.7* 104.0*  PLT 162 146*   Cardiac Enzymes: No results found for this basename: CKTOTAL, CKMB, CKMBINDEX, TROPONINI,  in the last 168 hours BNP (last 3 results) No results found for this basename: PROBNP,  in the last 8760 hours CBG: No results found for this basename: GLUCAP,  in the last 168 hours  Recent Results (from the past 240 hour(s))  MRSA PCR SCREENING     Status: None   Collection Time    11/05/13  9:23 PM      Result Value Ref Range Status   MRSA by PCR NEGATIVE  NEGATIVE Final   Comment:            The GeneXpert MRSA Assay (FDA     approved for NASAL specimens     only), is one component of a     comprehensive MRSA colonization     surveillance program. It is not     intended to diagnose MRSA     infection nor to guide or     monitor treatment for  MRSA infections.  CULTURE, BLOOD (ROUTINE X 2)     Status: None   Collection Time    11/06/13  8:16 AM      Result Value Ref Range Status   Specimen Description Blood   Final   Special Requests Normal   Final   Culture NO GROWTH <24 HRS   Final   Report Status PENDING   Incomplete  CULTURE, BLOOD (ROUTINE X 2)     Status: None   Collection Time    11/06/13  8:32 AM      Result Value Ref Range Status   Specimen Description Blood   Final   Special Requests Normal   Final   Culture NO GROWTH <24 HRS   Final   Report Status PENDING   Incomplete     Studies: Ct Head Wo Contrast  11/05/2013   CLINICAL DATA:  Weakness.  Difficulty walking.  EXAM: CT HEAD WITHOUT CONTRAST  TECHNIQUE: Contiguous axial images were obtained from the base of the skull through the vertex without intravenous contrast.  COMPARISON:  None.  FINDINGS: No acute intracranial abnormality.  Specifically, no hemorrhage, hydrocephalus, mass lesion, acute infarction, or significant intracranial injury. No acute calvarial abnormality. Visualized paranasal sinuses and mastoids clear. Orbital soft tissues unremarkable.  IMPRESSION: No acute intracranial abnormality.   Electronically Signed   By: Rolm Baptise M.D.   On: 11/05/2013 15:17    Scheduled Meds: . folic acid  1 mg Intravenous Daily  . lactulose  10 g Oral BID  . multivitamin with minerals  1 tablet Oral Daily  . pantoprazole  40 mg Oral Daily  . piperacillin-tazobactam (ZOSYN)  IV  3.375 g Intravenous Q8H  . sodium chloride  3 mL Intravenous Q12H  . thiamine  100 mg Intravenous Daily  . vancomycin  750 mg Intravenous Q12H   Continuous Infusions: . 0.9 % NaCl with KCl 20 mEq / L 50 mL/hr at 11/06/13 1300  . norepinephrine (LEVOPHED) Adult infusion     Antibiotics Given (last 72 hours)   Date/Time Action Medication Dose Rate   11/05/13 2227 Given   cefTRIAXone (ROCEPHIN) 1 g in dextrose 5 % 50 mL IVPB 1 g 100 mL/hr   11/06/13 1102 Given   piperacillin-tazobactam (ZOSYN) IVPB 3.375 g 3.375 g 12.5 mL/hr   11/06/13 1104 Given   vancomycin (VANCOCIN) 1,500 mg in sodium chloride 0.9 % 500 mL IVPB 1,500 mg 250 mL/hr      Principal Problem:   Acute blood loss anemia Active Problems:   Jaundice   Ascites   Dehydration   ARF (acute renal failure)   Transaminitis   Hepatopathy    Time spent: 12min    Tenea Sens  [facility] Pager [phone]. If 7PM-7AM, please contact night-coverage at [email or web address], password [facility] 11/06/2013, 1:07 PM  LOS: 1 day

## 2013-11-06 NOTE — Progress Notes (Signed)
ANTIBIOTIC CONSULT NOTE - INITIAL  Pharmacy Consult for Vancomycin and Zosyn Indication: sepsis  No Known Allergies  Patient Measurements: Height: 5\' 2"  (157.5 cm) Weight: 170 lb 10.2 oz (77.4 kg) IBW/kg (Calculated) : 50.1  Vital Signs: Temp: 97.5 F (36.4 C) (03/30 0732) Temp src: Axillary (03/30 0732) BP: 125/55 mmHg (03/30 1000) Pulse Rate: 124 (03/30 1000) Intake/Output from previous day: 03/29 0701 - 03/30 0700 In: 2275 [I.V.:1555; Blood:670; IV Piggyback:50] Out: 150 [Urine:150] Intake/Output from this shift: Total I/O In: 150 [I.V.:150] Out: 100 [Urine:100]  Labs:  Recent Labs  11/05/13 1459 11/06/13 0646  WBC 16.7* 16.1*  HGB 7.1* 8.7*  PLT 162 146*  CREATININE 1.41* 1.56*   Estimated Creatinine Clearance: 46.6 ml/min (by C-G formula based on Cr of 1.56). No results found for this basename: Letta Median, VANCORANDOM, GENTTROUGH, GENTPEAK, GENTRANDOM, TOBRATROUGH, TOBRAPEAK, TOBRARND, AMIKACINPEAK, AMIKACINTROU, AMIKACIN,  in the last 72 hours   Microbiology: Recent Results (from the past 720 hour(s))  URINE CULTURE     Status: None   Collection Time    10/08/13  3:10 PM      Result Value Ref Range Status   Specimen Description URINE, CATHETERIZED   Final   Special Requests NONE   Final   Culture  Setup Time     Final   Value: 10/09/2013 00:07     Performed at [facility]     Final   Value: NO GROWTH     Performed at [facility]   Culture     Final   Value: NO GROWTH     Performed at [facility]   Report Status 10/09/2013 FINAL   Final  MRSA PCR SCREENING     Status: None   Collection Time    11/05/13  9:23 PM      Result Value Ref Range Status   MRSA by PCR NEGATIVE  NEGATIVE Final   Comment:            The GeneXpert MRSA Assay (FDA     approved for NASAL specimens     only), is one component of a     comprehensive MRSA colonization     surveillance program. It is not     intended to  diagnose MRSA     infection nor to guide or     monitor treatment for     MRSA infections.  CULTURE, BLOOD (ROUTINE X 2)     Status: None   Collection Time    11/06/13  8:16 AM      Result Value Ref Range Status   Specimen Description Blood   Final   Special Requests Normal   Final   Culture NO GROWTH <24 HRS   Final   Report Status PENDING   Incomplete  CULTURE, BLOOD (ROUTINE X 2)     Status: None   Collection Time    11/06/13  8:32 AM      Result Value Ref Range Status   Specimen Description Blood   Final   Special Requests Normal   Final   Culture NO GROWTH <24 HRS   Final   Report Status PENDING   Incomplete    Medical History: Past Medical History  Diagnosis Date  . GERD (gastroesophageal reflux disease)   . Liver disease 10/05/13    Unknwn etiology. Pt refused biopsy  . Acute renal failure 09/2013    Pre-renal- resolved  . Folate deficiency 09/2013  .  Anasarca 10/10/2013    Medications:  Scheduled:  . folic acid  1 mg Intravenous Daily  . multivitamin with minerals  1 tablet Oral Daily  . piperacillin-tazobactam (ZOSYN)  IV  3.375 g Intravenous Q8H  . sodium chloride  3 mL Intravenous Q12H  . thiamine  100 mg Intravenous Daily  . vancomycin  1,500 mg Intravenous Once  . vancomycin  750 mg Intravenous Q12H   Assessment: 38yo female presents with weakness and swelling in the legs.  Being started on Zosyn and Vancomycin for suspected sepsis. Estimated Creatinine Clearance: 46.6 ml/min (by C-G formula based on Cr of 1.56).  ANTIBIOTICS: Vancomycin 3/30 >> Zosyn 3/30 >>  Goal of Therapy:  Vancomycin trough level 15-20 mcg/ml  Plan:  Zosyn 3.375gm IV q8h, each dose over 4 hrs Vancomycin 1500mg  IV x 1 (loading dose) then Vancomycin 750mg  IV q12hrs Check trough at steady state Monitor labs, renal fxn, and cultures  Hart Robinsons A 11/06/2013,10:19 AM

## 2013-11-06 NOTE — Progress Notes (Signed)
Initial visit. Discussed Advance Directives information. She stated she wasn't feeling like considering this now.  Offered emotional/spiritual support also. Shared I would return tomorrow to check with her.

## 2013-11-06 NOTE — Progress Notes (Signed)
*  PRELIMINARY RESULTS* Echocardiogram 2D Echocardiogram has been performed.  [place], [place] 11/06/2013, 10:06 AM

## 2013-11-07 ENCOUNTER — Encounter: Payer: Self-pay | Admitting: *Deleted

## 2013-11-07 ENCOUNTER — Encounter: Payer: Self-pay | Admitting: Gastroenterology

## 2013-11-07 ENCOUNTER — Telehealth: Payer: Self-pay | Admitting: *Deleted

## 2013-11-07 ENCOUNTER — Ambulatory Visit: Payer: Medicare Other | Admitting: Gastroenterology

## 2013-11-07 ENCOUNTER — Telehealth: Payer: Self-pay | Admitting: Gastroenterology

## 2013-11-07 DIAGNOSIS — K7460 Unspecified cirrhosis of liver: Secondary | ICD-10-CM | POA: Diagnosis present

## 2013-11-07 DIAGNOSIS — K7581 Nonalcoholic steatohepatitis (NASH): Secondary | ICD-10-CM

## 2013-11-07 DIAGNOSIS — R5383 Other fatigue: Secondary | ICD-10-CM

## 2013-11-07 DIAGNOSIS — R5381 Other malaise: Secondary | ICD-10-CM

## 2013-11-07 LAB — CBC
HCT: 21 % — ABNORMAL LOW (ref 36.0–46.0)
HEMATOCRIT: 20.3 % — AB (ref 36.0–46.0)
Hemoglobin: 6.9 g/dL — CL (ref 12.0–15.0)
Hemoglobin: 6.9 g/dL — CL (ref 12.0–15.0)
MCH: 34.8 pg — ABNORMAL HIGH (ref 26.0–34.0)
MCH: 35.8 pg — ABNORMAL HIGH (ref 26.0–34.0)
MCHC: 32.9 g/dL (ref 30.0–36.0)
MCHC: 34 g/dL (ref 30.0–36.0)
MCV: 106.1 fL — ABNORMAL HIGH (ref 78.0–100.0)
MCV: 105.2 fL — ABNORMAL HIGH (ref 78.0–100.0)
PLATELETS: 115 10*3/uL — AB (ref 150–400)
PLATELETS: 107 10*3/uL — AB (ref 150–400)
RBC: 1.98 MIL/uL — AB (ref 3.87–5.11)
RBC: 1.93 MIL/uL — AB (ref 3.87–5.11)
RDW: 26.9 % — ABNORMAL HIGH (ref 11.5–15.5)
RDW: 26.9 % — ABNORMAL HIGH (ref 11.5–15.5)
WBC: 12.4 10*3/uL — ABNORMAL HIGH (ref 4.0–10.5)
WBC: 12.2 10*3/uL — AB (ref 4.0–10.5)

## 2013-11-07 LAB — LACTIC ACID, PLASMA: LACTIC ACID, VENOUS: 1.2 mmol/L (ref 0.5–2.2)

## 2013-11-07 LAB — BASIC METABOLIC PANEL
BUN: 75 mg/dL — ABNORMAL HIGH (ref 6–23)
CHLORIDE: 98 meq/L (ref 96–112)
CO2: 26 mEq/L (ref 19–32)
Calcium: 8.3 mg/dL — ABNORMAL LOW (ref 8.4–10.5)
Creatinine, Ser: 1.65 mg/dL — ABNORMAL HIGH (ref 0.50–1.10)
GFR calc non Af Amer: 38 mL/min — ABNORMAL LOW (ref >90)
GFR, EST AFRICAN AMERICAN: 44 mL/min — AB (ref >90)
Glucose, Bld: 105 mg/dL — ABNORMAL HIGH (ref 70–99)
POTASSIUM: 3.7 meq/L (ref 3.7–5.3)
SODIUM: 138 meq/L (ref 137–147)

## 2013-11-07 LAB — FERRITIN: Ferritin: 554 ng/mL — ABNORMAL HIGH (ref 10–291)

## 2013-11-07 LAB — FOLATE

## 2013-11-07 LAB — HEPATIC FUNCTION PANEL
ALBUMIN: 1.8 g/dL — AB (ref 3.5–5.2)
ALT: 23 U/L (ref 0–35)
AST: 87 U/L — AB (ref 0–37)
Alkaline Phosphatase: 142 U/L — ABNORMAL HIGH (ref 39–117)
BILIRUBIN TOTAL: 3.7 mg/dL — AB (ref 0.3–1.2)
Bilirubin, Direct: 2.4 mg/dL — ABNORMAL HIGH (ref 0.0–0.3)
Indirect Bilirubin: 1.3 mg/dL — ABNORMAL HIGH (ref 0.3–0.9)
TOTAL PROTEIN: 5.7 g/dL — AB (ref 6.0–8.3)

## 2013-11-07 LAB — PREPARE RBC (CROSSMATCH)

## 2013-11-07 LAB — VITAMIN B12: VITAMIN B 12: 804 pg/mL (ref 211–911)

## 2013-11-07 MED ORDER — OXYCODONE HCL 5 MG PO TABS
5.0000 mg | ORAL_TABLET | Freq: Four times a day (QID) | ORAL | Status: DC | PRN
  Administered 2013-11-07 – 2013-11-17 (×24): 5 mg via ORAL
  Filled 2013-11-07 (×24): qty 1

## 2013-11-07 MED ORDER — LACTULOSE 10 GM/15ML PO SOLN: 10.0000 g | Freq: Every day | ORAL | Status: DC | PRN

## 2013-11-07 MED ORDER — LACTULOSE 10 GM/15ML PO SOLN: 10.0000 g | Freq: Every day | ORAL | Status: DC

## 2013-11-07 NOTE — Progress Notes (Signed)
  CRITICAL VALUE ALERT  Critical value received:  Hgb 6.9   Date of notification:  11/07/13   Time of notification:  0626   Critical value read back: yes    Nurse who received alert:  Jonetta Osgood   MD notified (1st page):  Rizwan  Time of first page:  0628  MD notified (2nd page): Rizwan   Time of second page: [phone]  Responding MD:  Wynelle Cleveland  Time MD responded:  [email or web address]  MD ordered CBC to recheck Hgb value

## 2013-11-07 NOTE — Progress Notes (Signed)
MD paged in regards to the patients BP 90'40's HR 80's during the shift. Hold scheduled Nadolol at this time per MD. Will cont to monitor.

## 2013-11-07 NOTE — Care Management Note (Addendum)
    Page 1 of 2   11/17/2013     3:33:30 PM   CARE MANAGEMENT NOTE 11/17/2013  Patient:  Wanda [name], Wanda [name]   Account Number:  [id number]  Date Initiated:  11/07/2013  Documentation initiated by:  Theophilus Kinds  Subjective/Objective Assessment:   Pt admitted from home with anemia and liver failure. Pt lives with her fiance and will return home at discharge. Pt has been fairly independent with ADL's. Pt does have a walker for prn use. Pt PCP was arranged with [facility] last     Action/Plan:   hospital visit. Pt stated that she was compliant with that visit but was not compliant with her GI followup. CM explained importance of compliance with appts to prevent repeat hospitalizations. Pt verbalized understanding.   Anticipated DC Date:  11/10/2013   Anticipated DC Plan:  [place]  CM consult      [facility] Choice  HOME HEALTH   Choice offered to / List presented to:  C-1 Patient        [place] arranged  HH-1 RN  [place].   Status of service:  Completed, signed off Medicare Important Message given?  YES (If response is "NO", the following Medicare IM given date fields will be blank) Date Medicare IM given:  11/17/2013 Date Additional Medicare IM given:    Discharge Disposition:  [place]  Per UR Regulation:    If discussed at Long Length of Stay Meetings, dates discussed:   11/14/2013  11/16/2013    Comments:  11/17/13 [place], RN BSN CM Pt discharged home today with [facility] RN and PT (per pts choice). Romualdo Bolk of [facility] is aware and will collect the pts information from the chart. [place] services to start within 48 hours of discharge. No DME needs noted. Pt and pts nurse aware of discharge arrangements.  11/07/13 [place], RN BSN CM

## 2013-11-07 NOTE — Telephone Encounter (Signed)
Levada Dy from [facility] called about pt's appointment she missed today, I rescheduled appointment with Levada Dy for pt's new appointment. Levada Dy stated she will let the pt be aware of this new appointment, I also mailed pt a letter and appointment card. Levada Dy stated that pt is in the hospital that is why pt missed appointment for today 11/07/13. Pt's new appointment is 12/05/13 at 8:30AM with Neil Crouch

## 2013-11-07 NOTE — Progress Notes (Signed)
Subjective: Denies abdominal pain, N/V. No overt GI bleeding. Appears 15 stools yesterday but patient denies diarrhea. Spoke with nursing who states she had multiple soft stools overnight. Appears dark brown/black but not consistent with melena.   Objective: Vital signs in last 24 hours: Temp:  [97.8 F (36.6 C)-98.3 F (36.8 C)] 97.9 F (36.6 C) (03/31 0400) Pulse Rate:  [74-124] 84 (03/31 0634) Resp:  [12-25] 19 (03/31 0634) BP: (77-125)/(25-61) 98/42 mmHg (03/31 0634) SpO2:  [92 %-100 %] 98 % (03/31 0634) Weight:  [172 lb 6.4 oz (78.2 kg)] 172 lb 6.4 oz (78.2 kg) (03/31 0500) Last BM Date: 11/06/13 General:   Alert and oriented, flat affect Eyes:  +icterus Heart:  S1, S2 present, no murmurs noted.  Abdomen:  Bowel sounds present, soft but distended, hepatomegaly, no tenderness.  Extremities:  2+ lower extremity pitting edema Neurologic:  Alert and  oriented x4, no asterixis Psych:  Alert and cooperative. Flat affect  Intake/Output from previous day: 03/30 0701 - 03/31 0700 In: 2536.3 [P.O.:240; I.V.:1496.3; IV Piggyback:800] Out: 580 [Urine:580] Intake/Output this shift:    Lab Results:  Recent Labs  11/06/13 0646 11/07/13 0423 11/07/13 0745  WBC 16.1* 12.4* 12.2*  HGB 8.7* 6.9* 6.9*  HCT 25.7* 21.0* 20.3*  PLT 146* 115* 107*   BMET  Recent Labs  11/05/13 1459 11/06/13 0646 11/07/13 0423  NA 133* 136* 138  K 4.1 4.3 3.7  CL 90* 94* 98  CO2 23 29 26   GLUCOSE 114* 114* 105*  BUN 36* 51* 75*  CREATININE 1.41* 1.56* 1.65*  CALCIUM 8.7 8.6 8.3*   LFT  Recent Labs  11/05/13 1459 11/06/13 0641 11/06/13 0646  PROT 7.0  --  6.6  ALBUMIN 2.3*  --  2.1*  AST 111*  --  105*  ALT 24  --  24  ALKPHOS 179*  --  168*  BILITOT 4.2* 6.1* 6.3*  BILIDIR  --  3.7*  --   IBILI  --  2.4*  --    PT/INR  Recent Labs  11/05/13 1459 11/06/13 0646  LABPROT 16.1* 16.8*  INR 1.32 1.40   Hepatitis Panel  Recent Labs  11/05/13 2030 11/06/13 0816  HEPBSAG  NEGATIVE  --   HCVAB NEGATIVE NEGATIVE  HEPAIGM NON REACTIVE  --   HEPBIGM NON REACTIVE  --     Studies/Results: Ct Head Wo Contrast  11/05/2013   CLINICAL DATA:  Weakness.  Difficulty walking.  EXAM: CT HEAD WITHOUT CONTRAST  TECHNIQUE: Contiguous axial images were obtained from the base of the skull through the vertex without intravenous contrast.  COMPARISON:  None.  FINDINGS: No acute intracranial abnormality. Specifically, no hemorrhage, hydrocephalus, mass lesion, acute infarction, or significant intracranial injury. No acute calvarial abnormality. Visualized paranasal sinuses and mastoids clear. Orbital soft tissues unremarkable.  IMPRESSION: No acute intracranial abnormality.   Electronically Signed   By: Rolm Baptise M.D.   On: 11/05/2013 15:17   US Paracentesis  11/06/2013   CLINICAL DATA:  Ascites.  EXAM: ULTRASOUND GUIDED THERAPEUTIC PARACENTESIS  COMPARISON:  US PARACENTESIS dated 09/12/2013; US ABDOMEN COMPLETE dated 09/12/2013; [place] ART/VEN FLOW ABD PELV DOPPLER LTD dated 09/12/2013  PROCEDURE: An ultrasound guided paracentesis was thoroughly discussed with the patient and questions answered. The benefits, risks, alternatives and complications were also discussed. The patient understands and wishes to proceed with the procedure. Written consent was obtained.  Ultrasound was performed to localize and mark an adequate pocket of fluid in the right lower quadrant of the abdomen.  The area was then prepped and draped in the normal sterile fashion. 1% Lidocaine was used for local anesthesia. Under ultrasound guidance a 5 [place] Yueh catheter was introduced. Paracentesis was performed. The catheter was removed and a dressing applied.  Complications: None.  FINDINGS: A total of approximately 2000 ml of pinkish thin  fluid was removed.  IMPRESSION: Successful ultrasound guided paracentesis yielding 2000 mL of ascites.   Electronically Signed   By: Lovey Newcomer M.D.   On: 11/06/2013 14:08     Assessment: 38 year old female with likely cirrhosis of unknown etiology, extensively evaluated during last 2 hospitalizations with negative viral markers, autoimmune work-up, celiac serologies, ceruloplasmin, and 24 hour urine copper; occult alcohol abuse remains in differential although patient adamantly denies. Previously has refused liver biopsy without supporting reason and continues to decline.  Paracentesis with 2 liters removed but no fluid analysis sent off as orders were not seen in time. Doubt SBP.   Acute on chronic anemia without overt signs of GI bleeding. Nursing states stool is dark brown/black but not consistent with melena. Agree with hemoccult stools. 2 units PRBCs already received this admission with improvement in Hgb; however, overnight Hgb dropped to 6.9. 1 additional unit PRBCs has been ordered. Ultimately needs colonoscopy with EGD as outpatient due to heme positive stool and variceal screening.   Loose stool: documentation in epic is "15". Lactulose started yesterday. Likely secondary to lactulose dosing. 1 stool thus far this morning; if further loose stools, check stool studies but doubt infectious etiology.      Plan: Hold lactulose if diarrhea PPI for GI prophylaxis  Liver biopsy still recommended; patient declines currently  2 gram Na diet  HFP today Agree with hemoccult stools Agree with additional 1 unit PRBCs Outpatient colonoscopy/EGD unless overt evidence of GI bleeding, then consider inpatient. Patient is hesitant to pursue this as well.    Orvil Feil, ANP-BC [facility] Gastroenterology     LOS: 2 days    11/07/2013, 8:07 AM

## 2013-11-07 NOTE — Telephone Encounter (Signed)
Pt was a no show

## 2013-11-07 NOTE — Telephone Encounter (Signed)
Patient currently inpatient.

## 2013-11-07 NOTE — Telephone Encounter (Signed)
PCP is aware and pt has been [facility]

## 2013-11-07 NOTE — Progress Notes (Signed)
[facility] PROGRESS NOTE  Wanda [name] BJY:[id number] DOB: 1975-09-05 DOA: 11/05/2013 PCP: No PCP Per Patient  Assessment/Plan: 1. Anemia-acute on chronic -likely due to chronic disease -no overt bleeding per pt -GI following -1unit PRBC transfused 3/29, repeat today -anemia panel c/w chornic disease -also hemoccult stool  2. SIRS/early sepsis -lactic acid improving -FU blood Cx -empiric Abx for now, stop tomorrow if cultures and ascitic fluid no c/w SBP -s/p US paracentesis, with 2L removal, fluid studies pending, will FU  3. Mild hepatic encephalopathy -agree with lactulose  4. Cirrhosis of unclear etiology -extensive workup recently no cause found -she declined biopsy last admission, have asked her to reconsider this now  5. Thrombocytopenia -due to liver disease  DVT proph: SCDs  Code Status: Full COde Family Communication: no family at bedside Disposition Plan: keep in SDU today   Consultants:  GI  Antibiotics:  Vanc/Zosyn  HPI/Subjective: Still tired, no abd pain,   Objective: Filed Vitals:   11/07/13 0634  BP: 98/42  Pulse: 84  Temp:   Resp: 19    Intake/Output Summary (Last 24 hours) at 11/07/13 0752 Last data filed at 11/07/13 0600  Gross per 24 hour  Intake 2536.25 ml  Output    580 ml  Net 1956.25 ml   Filed Weights   11/05/13 2146 11/06/13 0530 11/07/13 0500  Weight: 75 kg (165 lb 5.5 oz) 77.4 kg (170 lb 10.2 oz) 78.2 kg (172 lb 6.4 oz)    Exam:   General:  A lil drowsy, arousible, oriented x3, no distress  Cardiovascular: S1S2/RRR  Respiratory: CTAB  Abdomen: soft, obese, less distended, fluid thrill noted, non tender  Musculoskeletal: positive edema no c/c  Neuro: non focal, mild tremors, no asterixis today  Data Reviewed: Basic Metabolic Panel:  Recent Labs Lab 11/05/13 1459 11/06/13 0646 11/07/13 0423  NA 133* 136* 138  K 4.1 4.3 3.7  CL 90* 94* 98  CO2 23 29 26   GLUCOSE 114* 114* 105*  BUN 36* 51*  75*  CREATININE 1.41* 1.56* 1.65*  CALCIUM 8.7 8.6 8.3*  MG 1.2*  --   --    Liver Function Tests:  Recent Labs Lab 11/05/13 1459 11/06/13 0641 11/06/13 0646  AST 111*  --  105*  ALT 24  --  24  ALKPHOS 179*  --  168*  BILITOT 4.2* 6.1* 6.3*  PROT 7.0  --  6.6  ALBUMIN 2.3*  --  2.1*   No results found for this basename: LIPASE, AMYLASE,  in the last 168 hours  Recent Labs Lab 11/05/13 1500  AMMONIA 77*   CBC:  Recent Labs Lab 11/05/13 1459 11/06/13 0646 11/07/13 0423  WBC 16.7* 16.1* 12.4*  NEUTROABS 13.0*  --   --   HGB 7.1* 8.7* 6.9*  HCT 21.7* 25.7* 21.0*  MCV 110.7* 104.0* 106.1*  PLT 162 146* 115*   Cardiac Enzymes: No results found for this basename: CKTOTAL, CKMB, CKMBINDEX, TROPONINI,  in the last 168 hours BNP (last 3 results) No results found for this basename: PROBNP,  in the last 8760 hours CBG: No results found for this basename: GLUCAP,  in the last 168 hours  Recent Results (from the past 240 hour(s))  MRSA PCR SCREENING     Status: None   Collection Time    11/05/13  9:23 PM      Result Value Ref Range Status   MRSA by PCR NEGATIVE  NEGATIVE Final   Comment:  The GeneXpert MRSA Assay (FDA     approved for NASAL specimens     only), is one component of a     comprehensive MRSA colonization     surveillance program. It is not     intended to diagnose MRSA     infection nor to guide or     monitor treatment for     MRSA infections.  CULTURE, BLOOD (ROUTINE X 2)     Status: None   Collection Time    11/06/13  8:16 AM      Result Value Ref Range Status   Specimen Description Blood   Final   Special Requests Normal   Final   Culture NO GROWTH <24 HRS   Final   Report Status PENDING   Incomplete  CULTURE, BLOOD (ROUTINE X 2)     Status: None   Collection Time    11/06/13  8:32 AM      Result Value Ref Range Status   Specimen Description Blood   Final   Special Requests Normal   Final   Culture NO GROWTH <24 HRS   Final    Report Status PENDING   Incomplete     Studies: Ct Head Wo Contrast  11/05/2013   CLINICAL DATA:  Weakness.  Difficulty walking.  EXAM: CT HEAD WITHOUT CONTRAST  TECHNIQUE: Contiguous axial images were obtained from the base of the skull through the vertex without intravenous contrast.  COMPARISON:  None.  FINDINGS: No acute intracranial abnormality. Specifically, no hemorrhage, hydrocephalus, mass lesion, acute infarction, or significant intracranial injury. No acute calvarial abnormality. Visualized paranasal sinuses and mastoids clear. Orbital soft tissues unremarkable.  IMPRESSION: No acute intracranial abnormality.   Electronically Signed   By: Rolm Baptise M.D.   On: 11/05/2013 15:17   US Paracentesis  11/06/2013   CLINICAL DATA:  Ascites.  EXAM: ULTRASOUND GUIDED THERAPEUTIC PARACENTESIS  COMPARISON:  US PARACENTESIS dated 09/12/2013; US ABDOMEN COMPLETE dated 09/12/2013; [place] ART/VEN FLOW ABD PELV DOPPLER LTD dated 09/12/2013  PROCEDURE: An ultrasound guided paracentesis was thoroughly discussed with the patient and questions answered. The benefits, risks, alternatives and complications were also discussed. The patient understands and wishes to proceed with the procedure. Written consent was obtained.  Ultrasound was performed to localize and mark an adequate pocket of fluid in the right lower quadrant of the abdomen. The area was then prepped and draped in the normal sterile fashion. 1% Lidocaine was used for local anesthesia. Under ultrasound guidance a 5 [place] Yueh catheter was introduced. Paracentesis was performed. The catheter was removed and a dressing applied.  Complications: None.  FINDINGS: A total of approximately 2000 ml of pinkish thin  fluid was removed.  IMPRESSION: Successful ultrasound guided paracentesis yielding 2000 mL of ascites.   Electronically Signed   By: Lovey Newcomer M.D.   On: 11/06/2013 14:08    Scheduled Meds: . feeding supplement (ENSURE COMPLETE)  237 mL Oral BID BM  .  lactulose  10 g Oral BID  . multivitamin with minerals  1 tablet Oral Daily  . nadolol  20 mg Oral Daily  . pantoprazole  40 mg Oral Daily  . piperacillin-tazobactam (ZOSYN)  IV  3.375 g Intravenous Q8H  . sodium chloride  3 mL Intravenous Q12H  . vancomycin  750 mg Intravenous Q12H   Continuous Infusions: . sodium chloride 75 mL/hr at 11/07/13 0646   Antibiotics Given (last 72 hours)   Date/Time Action Medication Dose Rate   11/05/13 2227 Given  cefTRIAXone (ROCEPHIN) 1 g in dextrose 5 % 50 mL IVPB 1 g 100 mL/hr   11/06/13 1102 Given   piperacillin-tazobactam (ZOSYN) IVPB 3.375 g 3.375 g 12.5 mL/hr   11/06/13 1104 Given   vancomycin (VANCOCIN) 1,500 mg in sodium chloride 0.9 % 500 mL IVPB 1,500 mg 250 mL/hr   11/06/13 1645 Given   piperacillin-tazobactam (ZOSYN) IVPB 3.375 g 3.375 g 12.5 mL/hr   11/06/13 2122 Given   vancomycin (VANCOCIN) IVPB 750 mg/150 ml premix 750 mg 150 mL/hr   11/07/13 5916 Given   piperacillin-tazobactam (ZOSYN) IVPB 3.375 g 3.375 g 12.5 mL/hr      Principal Problem:   Acute blood loss anemia Active Problems:   Jaundice   Ascites   Dehydration   ARF (acute renal failure)   Transaminitis   Hepatopathy    Time spent: 67min    Joandy Burget  [facility] Pager [phone]. If 7PM-7AM, please contact night-coverage at [email or web address], password [email or web address] 11/07/2013, 7:52 AM  LOS: 2 days

## 2013-11-07 NOTE — Telephone Encounter (Signed)
Mailed letter °

## 2013-11-07 NOTE — Progress Notes (Addendum)
REVIEWED. MELD SCORE 18-19.  FLUID NOT SENT FOR ANALYSIS. COMPLETE 7 DAYS ABX FOR SBP IN LIGHT OF PT's HYPOTN AND NO OTHER SOURCE FOR INFECTION IDENTIFIED. BP IMPROVED AFTER ABX. pRBC INFUSING. REDUCE LACTULOSE TO ONCE DAILY PRN. PT NOT TAKING AT HOME MONITOR FOR S/SX OF GI BLEED OR ENCEPHALOPATHY. HOLD OXY FOR SBP < 85. DISCUSSED BENEFITS OF LIVER Bx. EXPLAINED TO PT SHE IS YOUNG AND SHE SHOULD HAVE A LIVER Bx BEFORE SHE BECOMES MORE ILL. SHE IS NOT A CANDIDATE FOR LIVER TRANSPLANT IF ALL CAUSES FOR LIVER DISEASE HAVE NOT BEEN RULED OUT. PT WILL THINK ABOUT AND LET us KNOW.

## 2013-11-08 ENCOUNTER — Other Ambulatory Visit (HOSPITAL_COMMUNITY): Payer: Self-pay | Admitting: Physician Assistant

## 2013-11-08 DIAGNOSIS — F329 Major depressive disorder, single episode, unspecified: Secondary | ICD-10-CM

## 2013-11-08 DIAGNOSIS — K7460 Unspecified cirrhosis of liver: Secondary | ICD-10-CM

## 2013-11-08 DIAGNOSIS — N179 Acute kidney failure, unspecified: Secondary | ICD-10-CM

## 2013-11-08 LAB — TYPE AND SCREEN
ABO/RH(D): A POS
Antibody Screen: NEGATIVE
UNIT DIVISION: 0
Unit division: 0
Unit division: 0

## 2013-11-08 LAB — CERULOPLASMIN: CERULOPLASMIN: 30 mg/dL (ref 18–53)

## 2013-11-08 LAB — COMPREHENSIVE METABOLIC PANEL
ALT: 29 U/L (ref 0–35)
AST: 138 U/L — ABNORMAL HIGH (ref 0–37)
Albumin: 1.9 g/dL — ABNORMAL LOW (ref 3.5–5.2)
Alkaline Phosphatase: 155 U/L — ABNORMAL HIGH (ref 39–117)
BUN: 90 mg/dL — AB (ref 6–23)
CO2: 26 meq/L (ref 19–32)
Calcium: 8.6 mg/dL (ref 8.4–10.5)
Chloride: 98 mEq/L (ref 96–112)
Creatinine, Ser: 1.71 mg/dL — ABNORMAL HIGH (ref 0.50–1.10)
GFR calc Af Amer: 42 mL/min — ABNORMAL LOW (ref >90)
GFR calc non Af Amer: 37 mL/min — ABNORMAL LOW (ref >90)
GLUCOSE: 108 mg/dL — AB (ref 70–99)
POTASSIUM: 3.5 meq/L — AB (ref 3.7–5.3)
Sodium: 138 mEq/L (ref 137–147)
TOTAL PROTEIN: 6.3 g/dL (ref 6.0–8.3)
Total Bilirubin: 3.4 mg/dL — ABNORMAL HIGH (ref 0.3–1.2)

## 2013-11-08 LAB — CBC
HEMATOCRIT: 21.9 % — AB (ref 36.0–46.0)
Hemoglobin: 7.3 g/dL — ABNORMAL LOW (ref 12.0–15.0)
MCH: 33.6 pg (ref 26.0–34.0)
MCHC: 33.3 g/dL (ref 30.0–36.0)
MCV: 100.9 fL — ABNORMAL HIGH (ref 78.0–100.0)
Platelets: 112 10*3/uL — ABNORMAL LOW (ref 150–400)
RBC: 2.17 MIL/uL — ABNORMAL LOW (ref 3.87–5.11)
RDW: 28.7 % — AB (ref 11.5–15.5)
WBC: 8.5 10*3/uL (ref 4.0–10.5)

## 2013-11-08 MED ORDER — DEXTROSE 5 % IV SOLN
2.0000 g | Freq: Three times a day (TID) | INTRAVENOUS | Status: DC
  Administered 2013-11-08 – 2013-11-13 (×16): 2 g via INTRAVENOUS
  Filled 2013-11-08 (×19): qty 2

## 2013-11-08 MED ORDER — CEFOTAXIME SODIUM 1 G IJ SOLR: 1.0000 g | Freq: Two times a day (BID) | INTRAMUSCULAR | Status: DC

## 2013-11-08 MED ORDER — SERTRALINE HCL 50 MG PO TABS
25.0000 mg | ORAL_TABLET | Freq: Every day | ORAL | Status: DC
  Administered 2013-11-09 – 2013-11-17 (×9): 25 mg via ORAL
  Filled 2013-11-08 (×9): qty 1

## 2013-11-08 NOTE — Progress Notes (Signed)
Per Dr. Oneida Alar, who is primary gastroenterologist for patient, will place psych consult for evaluation of ?depression.  Orvil Feil, ANP-BC [facility] G[facility]enter[facility]

## 2013-11-08 NOTE — Progress Notes (Signed)
Subjective: Slow to answer. Short responses. No overt GI bleeding. States diarrhea has improved; however, documented 9 occurrences during day shift yesterday and 1 overnight. Lactulose has been changed to prn.   Objective: Vital signs in last 24 hours: Temp:  [97.6 F (36.4 C)-98.2 F (36.8 C)] 97.6 F (36.4 C) (04/01 0756) Pulse Rate:  [83-93] 89 (04/01 0600) Resp:  [12-22] 16 (04/01 0600) BP: (57-101)/(36-50) 96/42 mmHg (04/01 0600) SpO2:  [91 %-100 %] 96 % (04/01 0600) Weight:  [171 lb 15.3 oz (78 kg)] 171 lb 15.3 oz (78 kg) (04/01 0500) Last BM Date: 11/07/13 General:   Alert and oriented, flat affect Head:  Normocephalic and atraumatic. Eyes:  +icterus Abdomen:  Bowel sounds present, soft, obese, non-tense ascites, significant hepatomegaly appreciated Extremities:  2+ lower extremity edema Neurologic:  Alert and  oriented x4; negative asterixis Psych:  Alert, flat affect, appears depressed.  Intake/Output from previous day: 03/31 0701 - 04/01 0700 In: 2262.5 [I.V.:1800; Blood:12.5; IV Piggyback:450] Out: 10 [Urine:10] Intake/Output this shift: Total I/O In: -  Out: 1 [Urine:1]  Lab Results:  Recent Labs  11/07/13 0423 11/07/13 0745 11/08/13 0718  WBC 12.4* 12.2* 8.5  HGB 6.9* 6.9* 7.3*  HCT 21.0* 20.3* 21.9*  PLT 115* 107* 112*   BMET  Recent Labs  11/06/13 0646 11/07/13 0423 11/08/13 0718  NA 136* 138 138  K 4.3 3.7 3.5*  CL 94* 98 98  CO2 29 26 26   GLUCOSE 114* 105* 108*  BUN 51* 75* 90*  CREATININE 1.56* 1.65* 1.71*  CALCIUM 8.6 8.3* 8.6   LFT  Recent Labs  11/06/13 0641 11/06/13 0646 11/07/13 0552 11/08/13 0718  PROT  --  6.6 5.7* 6.3  ALBUMIN  --  2.1* 1.8* 1.9*  AST  --  105* 87* 138*  ALT  --  24 23 29   ALKPHOS  --  168* 142* 155*  BILITOT 6.1* 6.3* 3.7* 3.4*  BILIDIR 3.7*  --  2.4*  --   IBILI 2.4*  --  1.3*  --    PT/INR  Recent Labs  11/05/13 1459 11/06/13 0646  LABPROT 16.1* 16.8*  INR 1.32 1.40   Hepatitis  Panel  Recent Labs  11/05/13 2030 11/06/13 0816  HEPBSAG NEGATIVE  --   HCVAB NEGATIVE NEGATIVE  HEPAIGM NON REACTIVE  --   HEPBIGM NON REACTIVE  --     Studies/Results: US Paracentesis  11/06/2013   CLINICAL DATA:  Ascites.  EXAM: ULTRASOUND GUIDED THERAPEUTIC PARACENTESIS  COMPARISON:  US PARACENTESIS dated 09/12/2013; US ABDOMEN COMPLETE dated 09/12/2013; [place] ART/VEN FLOW ABD PELV DOPPLER LTD dated 09/12/2013  PROCEDURE: An ultrasound guided paracentesis was thoroughly discussed with the patient and questions answered. The benefits, risks, alternatives and complications were also discussed. The patient understands and wishes to proceed with the procedure. Written consent was obtained.  Ultrasound was performed to localize and mark an adequate pocket of fluid in the right lower quadrant of the abdomen. The area was then prepped and draped in the normal sterile fashion. 1% Lidocaine was used for local anesthesia. Under ultrasound guidance a 5 [place] Yueh catheter was introduced. Paracentesis was performed. The catheter was removed and a dressing applied.  Complications: None.  FINDINGS: A total of approximately 2000 ml of pinkish thin  fluid was removed.  IMPRESSION: Successful ultrasound guided paracentesis yielding 2000 mL of ascites.   Electronically Signed   By: Lovey Newcomer M.D.   On: 11/06/2013 14:08    Assessment: 38 year old female with  likely cirrhosis of unknown etiology, extensively evaluated during last 2 hospitalizations with negative viral markers, autoimmune work-up, celiac serologies, ceruloplasmin, and 24 hour urine copper; occult alcohol abuse remains in differential although patient adamantly denies. Previously has refused liver biopsy without supporting reason and continues to decline. Paracentesis with 2 liters removed but no fluid analysis. Empirically treat for SBP for total of 7 days.   Acute on chronic anemia without overt signs of GI bleeding. Ultimately needs colonoscopy with  EGD as outpatient due to heme positive stool and variceal screening. Hemoccults have been ordered but none on file for this admission. Hgb still in 7 range, but no overt signs of GI bleeding. Patient has been hesitant regarding any invasive procedures.   Diarrhea: likely secondary to Lactulose. This has been changed to only prn. 1 loose stool overnight. Stool studies if persistent.   Acute Renal Failure: BUN/Cr continue to climb. Inadequate documentation of I/Os. Unclear exact urinary output. Needs strict recording and measurement of urine output. Consider Nephrology consult.   Options limited for patient as she continues to decline liver biopsy and is even hesitant about outpatient procedures (colonoscopy/EGD). Question a psychological component. Would consider psychological assessment during admission.   Plan: Lactulose only prn. Stool studies if persistent diarrhea despite stopping Lactulose PPI  2 gram Na diet Hemoccult stools Continue to recommend liver biopsy Outpatient colonoscopy/EGD Strict documentation of I/Os, specifically urine output.  Consider Nephrology and Psych consult   Orvil Feil, ANP-BC [facility] Gastroenterology    LOS: 3 days    11/08/2013, 8:30 AM

## 2013-11-08 NOTE — Progress Notes (Addendum)
[facility] PROGRESS NOTE  Wanda [name] OIZ:[id number] DOB: [birth year] DOA: 11/05/2013 PCP: No PCP Per Patient HPI:  Wanda [name] is a 38 year old female with recent hospitalizations secondary to anasarca, liver disease, and acute renal failure. During last admission she was evaluated extensively for possible etiology to underlying liver disease, all workup negative. Paracentesis Feb 2015 with 1180 ml ascitic fluid removed. No significant findings on fluid analysis, but her SAAG score was 1.6 indicating likely portal hypertension. She had continued to refuse liver biopsy during last hospitalization. She did not FU with Gi in office and presented to ER with weakness, leg swelling, low BP, worsening anemia, leukocytosis and elevated lactic acid. Since admission, was on empiric Iv Abx, now Cefotaxime for possible SBP, s/p paracentesis. Also transfused 2 units PRBC, GI following, care complicated by her refusal for tests  Assessment/Plan: 1. Anemia-acute on chronic -likely due to chronic disease -no overt bleeding per pt -GI following, d/w pt regarding endoscopy -s/p 2units PRBC 3/20 and 3/31 -CBC pending this am -anemia panel c/w chornic disease -also hemoccult stool  2. SIRS/early sepsis -lactic acid improving -FU blood Cx NGTD - was on empiric Abx till now, unfortunately ascitic fluid studies not done and specimen discarded. -agree with GI re: treating for SBP with empiric course of Abx, will change to Cefotaxime for 3 more days for 7 days of Abx, s/p 4days of VAnc/Zosyn -s/p US paracentesis, with 2L removal  3. Mild hepatic encephalopathy -  Lactulose cut down due to increased BMs on this  4. Cirrhosis of unclear etiology -extensive workup recently no cause found -she declined biopsy last admission, have asked her to reconsider this now since at risk of progressive liver failure and death -she is thinking about having this done, but not decided yet  5. Thrombocytopenia -due  to liver disease  DVT proph: SCDs  Code Status: Full COde Family Communication: no family at bedside Disposition Plan: transfer to floor   Consultants:  GI  Antibiotics:  Vanc/Zosyn 3/29-4/1  Cefotaxime 4/1  HPI/Subjective: Still tired, no abd pain, eating ok, still wishy washy about liver biopsy  Objective: Filed Vitals:   11/08/13 0600  BP: 96/42  Pulse: 89  Temp:   Resp: 16    Intake/Output Summary (Last 24 hours) at 11/08/13 0744 Last data filed at 11/08/13 0600  Gross per 24 hour  Intake 2262.5 ml  Output     10 ml  Net 2252.5 ml   Filed Weights   11/06/13 0530 11/07/13 0500 11/08/13 0500  Weight: 77.4 kg (170 lb 10.2 oz) 78.2 kg (172 lb 6.4 oz) 78 kg (171 lb 15.3 oz)    Exam:   General:  A lil drowsy, arousible, oriented x3, no distress  Cardiovascular: S1S2/RRR  Respiratory: CTAB  Abdomen: soft, obese, less distended, fluid thrill noted, non tender  Musculoskeletal: 2 plus edema now,  no c/c  Neuro: non focal, mild tremors, no asterixis today  Data Reviewed: Basic Metabolic Panel:  Recent Labs Lab 11/05/13 1459 11/06/13 0646 11/07/13 0423  NA 133* 136* 138  K 4.1 4.3 3.7  CL 90* 94* 98  CO2 23 29 26   GLUCOSE 114* 114* 105*  BUN 36* 51* 75*  CREATININE 1.41* 1.56* 1.65*  CALCIUM 8.7 8.6 8.3*  MG 1.2*  --   --    Liver Function Tests:  Recent Labs Lab 11/05/13 1459 11/06/13 0641 11/06/13 0646 11/07/13 0552  AST 111*  --  105* 87*  ALT 24  --  24 23  ALKPHOS 179*  --  168* 142*  BILITOT 4.2* 6.1* 6.3* 3.7*  PROT 7.0  --  6.6 5.7*  ALBUMIN 2.3*  --  2.1* 1.8*   No results found for this basename: LIPASE, AMYLASE,  in the last 168 hours  Recent Labs Lab 11/05/13 1500  AMMONIA 77*   CBC:  Recent Labs Lab 11/05/13 1459 11/06/13 0646 11/07/13 0423 11/07/13 0745  WBC 16.7* 16.1* 12.4* 12.2*  NEUTROABS 13.0*  --   --   --   HGB 7.1* 8.7* 6.9* 6.9*  HCT 21.7* 25.7* 21.0* 20.3*  MCV 110.7* 104.0* 106.1* 105.2*   PLT 162 146* 115* 107*   Cardiac Enzymes: No results found for this basename: CKTOTAL, CKMB, CKMBINDEX, TROPONINI,  in the last 168 hours BNP (last 3 results) No results found for this basename: PROBNP,  in the last 8760 hours CBG: No results found for this basename: GLUCAP,  in the last 168 hours  Recent Results (from the past 240 hour(s))  MRSA PCR SCREENING     Status: None   Collection Time    11/05/13  9:23 PM      Result Value Ref Range Status   MRSA by PCR NEGATIVE  NEGATIVE Final   Comment:            The GeneXpert MRSA Assay (FDA     approved for NASAL specimens     only), is one component of a     comprehensive MRSA colonization     surveillance program. It is not     intended to diagnose MRSA     infection nor to guide or     monitor treatment for     MRSA infections.  CULTURE, BLOOD (ROUTINE X 2)     Status: None   Collection Time    11/06/13  8:16 AM      Result Value Ref Range Status   Specimen Description BLOOD RIGHT HAND   Final   Special Requests     Final   Value: BOTTLES DRAWN AEROBIC AND ANAEROBIC AEB=10CC ANA=8CC   Culture NO GROWTH 1 DAY   Final   Report Status PENDING   Incomplete  CULTURE, BLOOD (ROUTINE X 2)     Status: None   Collection Time    11/06/13  8:32 AM      Result Value Ref Range Status   Specimen Description BLOOD LEFT ANTECUBITAL   Final   Special Requests BOTTLES DRAWN AEROBIC ONLY 5CC   Final   Culture NO GROWTH 1 DAY   Final   Report Status PENDING   Incomplete     Studies: US Paracentesis  11/06/2013   CLINICAL DATA:  Ascites.  EXAM: ULTRASOUND GUIDED THERAPEUTIC PARACENTESIS  COMPARISON:  US PARACENTESIS dated 09/12/2013; US ABDOMEN COMPLETE dated 09/12/2013; [place] ART/VEN FLOW ABD PELV DOPPLER LTD dated 09/12/2013  PROCEDURE: An ultrasound guided paracentesis was thoroughly discussed with the patient and questions answered. The benefits, risks, alternatives and complications were also discussed. The patient understands and wishes to  proceed with the procedure. Written consent was obtained.  Ultrasound was performed to localize and mark an adequate pocket of fluid in the right lower quadrant of the abdomen. The area was then prepped and draped in the normal sterile fashion. 1% Lidocaine was used for local anesthesia. Under ultrasound guidance a 5 [place] Yueh catheter was introduced. Paracentesis was performed. The catheter was removed and a dressing applied.  Complications: None.  FINDINGS: A total of approximately 2000 ml  of pinkish thin  fluid was removed.  IMPRESSION: Successful ultrasound guided paracentesis yielding 2000 mL of ascites.   Electronically Signed   By: Lovey Newcomer M.D.   On: 11/06/2013 14:08    Scheduled Meds: . cefoTAXime (CLAFORAN) IV  1 g Intravenous Q12H  . feeding supplement (ENSURE COMPLETE)  237 mL Oral BID BM  . multivitamin with minerals  1 tablet Oral Daily  . pantoprazole  40 mg Oral Daily  . sodium chloride  3 mL Intravenous Q12H   Continuous Infusions:   Antibiotics Given (last 72 hours)   Date/Time Action Medication Dose Rate   11/05/13 2227 Given   cefTRIAXone (ROCEPHIN) 1 g in dextrose 5 % 50 mL IVPB 1 g 100 mL/hr   11/06/13 1102 Given   piperacillin-tazobactam (ZOSYN) IVPB 3.375 g 3.375 g 12.5 mL/hr   11/06/13 1104 Given   vancomycin (VANCOCIN) 1,500 mg in sodium chloride 0.9 % 500 mL IVPB 1,500 mg 250 mL/hr   11/06/13 1645 Given   piperacillin-tazobactam (ZOSYN) IVPB 3.375 g 3.375 g 12.5 mL/hr   11/06/13 2122 Given   vancomycin (VANCOCIN) IVPB 750 mg/150 ml premix 750 mg 150 mL/hr   11/07/13 0137 Given   piperacillin-tazobactam (ZOSYN) IVPB 3.375 g 3.375 g 12.5 mL/hr   11/07/13 0816 Given   piperacillin-tazobactam (ZOSYN) IVPB 3.375 g 3.375 g 12.5 mL/hr   11/07/13 0900 Given   vancomycin (VANCOCIN) IVPB 750 mg/150 ml premix 750 mg 150 mL/hr   11/07/13 1630 Given   piperacillin-tazobactam (ZOSYN) IVPB 3.375 g 3.375 g 12.5 mL/hr   11/07/13 2117 Given   vancomycin (VANCOCIN) IVPB  750 mg/150 ml premix 750 mg 150 mL/hr   11/08/13 0041 Given   piperacillin-tazobactam (ZOSYN) IVPB 3.375 g 3.375 g 12.5 mL/hr      Principal Problem:   Acute blood loss anemia Active Problems:   Jaundice   Ascites   Dehydration   ARF (acute renal failure)   Transaminitis   Hepatopathy   Cirrhosis    Time spent: 59min    Nakyiah Kuck  [facility] Pager [phone]. If 7PM-7AM, please contact night-coverage at [email or web address], password [facility] 11/08/2013, 7:44 AM  LOS: 3 days

## 2013-11-08 NOTE — Consult Note (Signed)
Reason for Consult:Evaluation for MDD Referring Physician: Oneida Alar MD  Wanda [name] is an 38 y.o. female.  HPI: The patient is a 38 y/o WF, looking older than stated age, admitted to [facility] on 3/29 due to swelling of her LE and generalized weakness concerns. During the course of her hospitalization she has displayed to her treatment team but not actively endorsed MDD features which have been concerning. The patient is being w/u for Liver d/z and concurrent hepatic encephalopathy. The patient is denying any depressive sx to include, racing thoughts, problems with concentration, irritability, mood swings, changes with appetite, changes with hygiene or appearance, crying spells, sadness, SI,HI, AVH, paranoia and or delusional thoughts. The patient is endorsing insomnia and states she has an erratic sleep pattern over a 6 - 8 hour session daily over the last couple months. When asked to rate her depression she again endorsed not feeling depressed but rated her applicable symptomatolgy a 1/10. The patient is also denying any anxiety concerns, hx GAD, panic attacks, PTSD, OCD, bipolar d/o or psychotic episodes. The patient is denying any hx of mental health illnesses, prior outpatient evaluations and or hospitalizations. The patient is also denying any family hx of psychiatric illnesses, alcoholism, substance abuse and or physical abuse. The patient is a reformed smoker, non drinker and denies use of illicit drugs. The patient is reportedly on disability but cannot remember why?  Past Medical History  Diagnosis Date  . GERD (gastroesophageal reflux disease)   . Liver disease 10/05/13    Unknwn etiology. Pt refused biopsy  . Acute renal failure 09/2013    Pre-renal- resolved  . Folate deficiency 09/2013  . Anasarca 10/10/2013    Past Surgical History  Procedure Laterality Date  . None    . Paracentesis  Feb 2015    1180 fluid, negative fluid analysis.     Family History  Problem Relation  Age of Onset  . Heart disease Mother   . Colon cancer Neg Hx   . Liver disease Neg Hx     Social History:  reports that she quit smoking about 5 years ago. Her smoking use included Cigarettes. She has a 5 pack-year smoking history. She has never used smokeless tobacco. She reports that she does not drink alcohol or use illicit drugs.  Allergies: No Known Allergies  Medications: I have reviewed the patient's current medications.  Results for orders placed during the hospital encounter of 11/05/13 (from the past 48 hour(s))  BASIC METABOLIC PANEL     Status: Abnormal   Collection Time    11/07/13  4:23 AM      Result Value Ref Range   Sodium 138  137 - 147 mEq/L   Potassium 3.7  3.7 - 5.3 mEq/L   Chloride 98  96 - 112 mEq/L   CO2 26  19 - 32 mEq/L   Glucose, Bld 105 (*) 70 - 99 mg/dL   BUN 75 (*) 6 - 23 mg/dL   Creatinine, Ser 1.65 (*) 0.50 - 1.10 mg/dL   Calcium 8.3 (*) 8.4 - 10.5 mg/dL   GFR calc non Af Amer 38 (*) >90 mL/min   GFR calc Af Amer 44 (*) >90 mL/min   Comment: (NOTE)     The eGFR has been calculated using the CKD EPI equation.     This calculation has not been validated in all clinical situations.     eGFR's persistently <90 mL/min signify possible Chronic Kidney     Disease.  LACTIC  ACID, PLASMA     Status: None   Collection Time    11/07/13  4:23 AM      Result Value Ref Range   Lactic Acid, Venous 1.2  0.5 - 2.2 mmol/L  CBC     Status: Abnormal   Collection Time    11/07/13  4:23 AM      Result Value Ref Range   WBC 12.4 (*) 4.0 - 10.5 K/uL   RBC 1.98 (*) 3.87 - 5.11 MIL/uL   Hemoglobin 6.9 (*) 12.0 - 15.0 g/dL   Comment: RESULT REPEATED AND VERIFIED     CRITICAL RESULT CALLED TO, READ BACK BY AND VERIFIED WITH:     MICHAEL GRAY RN ON [id number] AT 0625 BY RESSEGGER R   HCT 21.0 (*) 36.0 - 46.0 %   MCV 106.1 (*) 78.0 - 100.0 fL   MCH 34.8 (*) 26.0 - 34.0 pg   MCHC 32.9  30.0 - 36.0 g/dL   RDW 26.9 (*) 11.5 - 15.5 %   Platelets 115 (*) 150 - 400 K/uL    Comment: SPECIMEN CHECKED FOR CLOTS     PLATELET COUNT CONFIRMED BY SMEAR  HEPATIC FUNCTION PANEL     Status: Abnormal   Collection Time    11/07/13  5:52 AM      Result Value Ref Range   Total Protein 5.7 (*) 6.0 - 8.3 g/dL   Albumin 1.8 (*) 3.5 - 5.2 g/dL   AST 87 (*) 0 - 37 U/L   ALT 23  0 - 35 U/L   Alkaline Phosphatase 142 (*) 39 - 117 U/L   Total Bilirubin 3.7 (*) 0.3 - 1.2 mg/dL   Bilirubin, Direct 2.4 (*) 0.0 - 0.3 mg/dL   Indirect Bilirubin 1.3 (*) 0.3 - 0.9 mg/dL  CBC     Status: Abnormal   Collection Time    11/07/13  7:45 AM      Result Value Ref Range   WBC 12.2 (*) 4.0 - 10.5 K/uL   RBC 1.93 (*) 3.87 - 5.11 MIL/uL   Hemoglobin 6.9 (*) 12.0 - 15.0 g/dL   Comment: RESULT REPEATED AND VERIFIED     CRITICAL VALUE NOTED.  VALUE IS CONSISTENT WITH PREVIOUSLY REPORTED AND CALLED VALUE.   HCT 20.3 (*) 36.0 - 46.0 %   MCV 105.2 (*) 78.0 - 100.0 fL   MCH 35.8 (*) 26.0 - 34.0 pg   MCHC 34.0  30.0 - 36.0 g/dL   RDW 26.9 (*) 11.5 - 15.5 %   Platelets 107 (*) 150 - 400 K/uL  PREPARE RBC (CROSSMATCH)     Status: None   Collection Time    11/07/13  7:46 AM      Result Value Ref Range   Order Confirmation ORDER PROCESSED BY BLOOD BANK    COMPREHENSIVE METABOLIC PANEL     Status: Abnormal   Collection Time    11/08/13  7:18 AM      Result Value Ref Range   Sodium 138  137 - 147 mEq/L   Potassium 3.5 (*) 3.7 - 5.3 mEq/L   Chloride 98  96 - 112 mEq/L   CO2 26  19 - 32 mEq/L   Glucose, Bld 108 (*) 70 - 99 mg/dL   BUN 90 (*) 6 - 23 mg/dL   Creatinine, Ser 1.71 (*) 0.50 - 1.10 mg/dL   Calcium 8.6  8.4 - 10.5 mg/dL   Total Protein 6.3  6.0 - 8.3 g/dL   Albumin 1.9 (*)  3.5 - 5.2 g/dL   AST 138 (*) 0 - 37 U/L   ALT 29  0 - 35 U/L   Alkaline Phosphatase 155 (*) 39 - 117 U/L   Total Bilirubin 3.4 (*) 0.3 - 1.2 mg/dL   GFR calc non Af Amer 37 (*) >90 mL/min   GFR calc Af Amer 42 (*) >90 mL/min   Comment: (NOTE)     The eGFR has been calculated using the CKD EPI equation.      This calculation has not been validated in all clinical situations.     eGFR's persistently <90 mL/min signify possible Chronic Kidney     Disease.  CBC     Status: Abnormal   Collection Time    11/08/13  7:18 AM      Result Value Ref Range   WBC 8.5  4.0 - 10.5 K/uL   RBC 2.17 (*) 3.87 - 5.11 MIL/uL   Hemoglobin 7.3 (*) 12.0 - 15.0 g/dL   HCT 21.9 (*) 36.0 - 46.0 %   MCV 100.9 (*) 78.0 - 100.0 fL   MCH 33.6  26.0 - 34.0 pg   MCHC 33.3  30.0 - 36.0 g/dL   RDW 28.7 (*) 11.5 - 15.5 %   Platelets 112 (*) 150 - 400 K/uL   Comment: CONSISTENT WITH PREVIOUS RESULT    No results found.  Review of Systems  Constitutional: Positive for malaise/fatigue. Negative for fever, chills and weight loss.  HENT: Negative.   Eyes: Negative.   Respiratory: Negative.   Cardiovascular: Positive for orthopnea, leg swelling and PND. Negative for chest pain.  Gastrointestinal: Positive for nausea, abdominal pain and diarrhea. Negative for heartburn, vomiting, constipation, blood in stool and melena.  Genitourinary: Negative for dysuria, urgency, frequency and flank pain.       Positive dark urine  Musculoskeletal: Negative.   Skin: Negative.   Neurological: Positive for focal weakness and weakness. Negative for dizziness, sensory change, speech change, seizures and loss of consciousness.  Endo/Heme/Allergies: Negative.   Psychiatric/Behavioral:       Patient is denying hx of mood d/o, anxiety and or panic attacks, phobias, OCD, delusional thoughts, paranoia or AVH.   Blood pressure 100/50, pulse 87, temperature 98.1 F (36.7 C), temperature source Oral, resp. rate 20, height _0  (1.575 m), weight 171 lb 15.3 oz (78 kg), last menstrual period 09/12/2013, SpO2 96.00%. Physical Exam  Vitals reviewed. Constitutional: She is oriented to person, place, and time. She appears well-developed and well-nourished.  Neurological: She is alert and oriented to person, place, and time.  Psychiatric:  Patient  appears older than stated age, casually dressed and disheveled. Patient maintains fair eye contact throughout the exam. Patient with flat affect and over all depressed congruent mood. Voice with normal rate and soft tone. Thought processes is confused and and initially constricted. Patient is denying any SI/HI/AVH, paranoia or delusional thoughts. Patient is demonstrating limited insight but not problems with impulse controls.    Assessment/Plan: 1) MDD without psychotic features, SI/HI or AVH, in setting of newly Dx Liver d/z/hepatic encephalopathy.  * Check TSH with AM labs * Will start Zoloft 25 mg daily * Recommend Out patient reevaluation with [facility]  upon discharge in 2 - 4 weeks, social work to assist with appointment * Patient not meeting criteria for IP psychiatric evaluation and or crises mgmt, safety and stabilization. * This case and plan of care was discussed and agreed upon with my attending physician Dr Sabra Heck (Psychiatry)  Laverle Hobby  11/08/2013, 10:38 PM   Case discussed, recommendations given Geralyn Flash A. Sabra Heck, M.D.

## 2013-11-08 NOTE — BH Assessment (Signed)
Consulted with Lindsi ,RN pt's nurse to coordinate tele-assessment time and pt's nurse is in the middle of report and needs to clarify with provider if TA versusTP is requested.   Shaune Pollack, MS, [place] Assessment Counselor

## 2013-11-08 NOTE — Progress Notes (Signed)
Report called to RN for 308. Pt family updated.

## 2013-11-08 NOTE — BH Assessment (Signed)
Lindsi, RN called back to inform TTS that a TelePysch Consult is requested for Psychiatric Extender to evaluate patient. Lonzo Cloud notified of this and agreed to evaluate pt.  Shaune Pollack, MS, [place] Assessment Counselor

## 2013-11-09 LAB — COMPREHENSIVE METABOLIC PANEL
ALK PHOS: 167 U/L — AB (ref 39–117)
ALT: 35 U/L (ref 0–35)
AST: 173 U/L — AB (ref 0–37)
Albumin: 2.1 g/dL — ABNORMAL LOW (ref 3.5–5.2)
BUN: 87 mg/dL — ABNORMAL HIGH (ref 6–23)
CHLORIDE: 98 meq/L (ref 96–112)
CO2: 24 meq/L (ref 19–32)
Calcium: 9.1 mg/dL (ref 8.4–10.5)
Creatinine, Ser: 1.59 mg/dL — ABNORMAL HIGH (ref 0.50–1.10)
GFR, EST AFRICAN AMERICAN: 46 mL/min — AB (ref >90)
GFR, EST NON AFRICAN AMERICAN: 40 mL/min — AB (ref >90)
GLUCOSE: 149 mg/dL — AB (ref 70–99)
POTASSIUM: 3.7 meq/L (ref 3.7–5.3)
SODIUM: 137 meq/L (ref 137–147)
Total Bilirubin: 2.7 mg/dL — ABNORMAL HIGH (ref 0.3–1.2)
Total Protein: 6.8 g/dL (ref 6.0–8.3)

## 2013-11-09 LAB — CBC
HCT: 22.1 % — ABNORMAL LOW (ref 36.0–46.0)
HEMOGLOBIN: 7.3 g/dL — AB (ref 12.0–15.0)
MCH: 34 pg (ref 26.0–34.0)
MCHC: 33 g/dL (ref 30.0–36.0)
MCV: 102.8 fL — ABNORMAL HIGH (ref 78.0–100.0)
PLATELETS: 94 10*3/uL — AB (ref 150–400)
RBC: 2.15 MIL/uL — AB (ref 3.87–5.11)
RDW: 28.6 % — ABNORMAL HIGH (ref 11.5–15.5)
WBC: 7.4 10*3/uL (ref 4.0–10.5)

## 2013-11-09 LAB — TSH: TSH: 7.51 u[IU]/mL — ABNORMAL HIGH (ref 0.350–4.500)

## 2013-11-09 MED ORDER — TORSEMIDE 20 MG PO TABS
50.0000 mg | ORAL_TABLET | Freq: Every day | ORAL | Status: DC
  Administered 2013-11-09: 50 mg via ORAL
  Filled 2013-11-09: qty 3

## 2013-11-09 MED ORDER — SPIRONOLACTONE 25 MG PO TABS: 50.0000 mg | ORAL_TABLET | Freq: Every day | ORAL | Status: DC

## 2013-11-09 NOTE — Progress Notes (Signed)
PROGRESS NOTE  Wanda [name] CVE:[id number] DOB: 1976/03/18 DOA: 11/05/2013 PCP: No PCP Per Patient [facility]  Summary: 38 year old woman with cirrhosis of unclear etiology who presented with bilateral lower extremity edema and generalized weakness. Initial evaluation revealed decompensated cirrhosis, anemia and possible early sepsis, SBP, hepatic encephalopathy.  Assessment/Plan: 1. SIRS/early sepsis. Clinically resolved. Afebrile, no leukocytosis. 2. Possible SBP. Continue empiric treatment. No studies were sent. 3. Hepatic encephalopathy. Appears resolved at this point. 4. Diarrhea. Thought to be secondary to lactulose. Resolved. 5. Acute renal failure. Progressively has worsened while off diuretics. 6. Cirrhosis of unclear etiology. 24 hour urinary copper minimally elevated without other positive parameters for Wilson's disease. Negative viral markers, negative autoimmune, normal celiac serologies Feb 2015. Paracentesis Feb 2015 with 1180 ml ascitic fluid removed. No significant findings on fluid analysis, but her SAAG score was 1.6 indicating likely portal hypertension. 7. 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 8. Anemia, acute on chronic. Likely secondary to chronic disease with history of folate deficiency. Status post 1 unit packed red blood cells 3/29 and 1 unit 3/31. The patient declined endoscopy in the past. Will need outpatient endoscopy. 9. Possible alcohol use: Patient denies. 10. MDD without psychotic features. Psychiatry recommended outpatient followup with the patient declined this. Continue Zoloft. 11. Elevated TSH.   Complete 7 days of antibiotics for SBP, change to oral therapy in the morning  Check BMP in the morning. Resume diuretics.   Check T4, T3  Code Status: full code DVT prophylaxis: SCDs Family Communication: discussed with aunt Disposition Plan: home when improved  Murray Hodgkins, MD  [facility]  Pager [phone] If 7PM-7AM,  please contact night-coverage at [email or web address], password [facility] 11/09/2013, 1:48 PM  LOS: 4 days   Consultants:  GI  Psychiatry  Procedures:  3/30 Large-volume paracentesis with removal of 2 L of fluid.   2-D echocardiogram: Left ventricular ejection fraction 65-70%. Normal wall motion. Diastolic dysfunction noted.  Antibiotics:  Vanc/Zosyn 3/29-4/1  Cefotaxime 4/1 >>   HPI/Subjective: Overall feeling better with minimal abdominal pain. Still has significant lower extremity edema.  Objective: Filed Vitals:   11/08/13 1300 11/08/13 1400 11/08/13 2019 11/09/13 0451  BP: 91/52 99/49 100/50 103/45  Pulse: 92 97 87 94  Temp:   98.1 F (36.7 C) 97.6 F (36.4 C)  TempSrc:   Oral Oral  Resp: 15 14 20 20   Height:      Weight:    81.149 kg (178 lb 14.4 oz)  SpO2: 98% 96% 96% 100%    Intake/Output Summary (Last 24 hours) at 11/09/13 1348 Last data filed at 11/09/13 1010  Gross per 24 hour  Intake    340 ml  Output    550 ml  Net   -210 ml     Filed Weights   11/07/13 0500 11/08/13 0500 11/09/13 0451  Weight: 78.2 kg (172 lb 6.4 oz) 78 kg (171 lb 15.3 oz) 81.149 kg (178 lb 14.4 oz)    Exam:   Afebrile, vital signs are stable.  Cardiovascular regular rate and rhythm. No murmur, rub or gallop. 3+ bilateral lower extremity edema.  Gen. Appears calm and comfortable.  Respiratory clear to auscultation bilaterally. No wheezes, rales or rhonchi. Normal respiratory effort.  Abdomen somewhat distended. Soft, nontender.  Skin appears grossly unremarkable.  Data Reviewed:  Voids not recorded, I/O unreliable (recorded as +6 LITERS)  BUN and creatinine with modest improvement, 87/1.59. Potassium normal. Total bilirubin modestly improved, AST stable.  Hemoglobin stable some 0.3. Platelet count 94.  TSH  7.51  Scheduled Meds: . cefoTAXime (CLAFORAN) IV  2 g Intravenous Q8H  . feeding supplement (ENSURE COMPLETE)  237 mL Oral BID BM  . multivitamin with minerals  1 tablet  Oral Daily  . pantoprazole  40 mg Oral Daily  . sertraline  25 mg Oral Daily  . sodium chloride  3 mL Intravenous Q12H   Continuous Infusions:   Principal Problem:   Acute blood loss anemia Active Problems:   Jaundice   Ascites   Dehydration   ARF (acute renal failure)   Transaminitis   Hepatopathy   Cirrhosis   Time spent 35 minutes

## 2013-11-09 NOTE — Progress Notes (Signed)
    Subjective: Denies N/V, confusion, overt GI bleeding. Willing to proceed with liver biopsy if necessary. Diarrhea resolved per patient.   Objective: Vital signs in last 24 hours: Temp:  [97.6 F (36.4 C)-98.1 F (36.7 C)] 97.6 F (36.4 C) (04/02 0451) Pulse Rate:  [87-103] 94 (04/02 0451) Resp:  [14-21] 20 (04/02 0451) BP: (84-103)/(39-52) 103/45 mmHg (04/02 0451) SpO2:  [80 %-100 %] 100 % (04/02 0451) Weight:  [178 lb 14.4 oz (81.149 kg)] 178 lb 14.4 oz (81.149 kg) (04/02 0451) Last BM Date: 11/08/13 General:   Alert and oriented, flat affect Head:  Normocephalic and atraumatic. Eyes:  +scleral icterus Abdomen:  Limited exam with patient sitting up in chair; abdomen obese, non-tense, non-tender.  Extremities:  2-3+ lower extremity pitting edema Neurologic:  Alert and  oriented x4;  No asterixis Psych:  Alert and cooperative. Flat affect  Intake/Output from previous day: 04/01 0701 - 04/02 0700 In: 415 [P.O.:120; I.V.:145; IV Piggyback:150] Out: 376 [Urine:376] Intake/Output this shift:    Lab Results:  Recent Labs  11/07/13 0745 11/08/13 0718 11/09/13 0609  WBC 12.2* 8.5 7.4  HGB 6.9* 7.3* 7.3*  HCT 20.3* 21.9* 22.1*  PLT 107* 112* 94*   BMET  Recent Labs  11/07/13 0423 11/08/13 0718 11/09/13 0609  NA 138 138 137  K 3.7 3.5* 3.7  CL 98 98 98  CO2 26 26 24   GLUCOSE 105* 108* 149*  BUN 75* 90* 87*  CREATININE 1.65* 1.71* 1.59*  CALCIUM 8.3* 8.6 9.1   LFT  Recent Labs  11/07/13 0552 11/08/13 0718 11/09/13 0609  PROT 5.7* 6.3 6.8  ALBUMIN 1.8* 1.9* 2.1*  AST 87* 138* 173*  ALT 23 29 35  ALKPHOS 142* 155* 167*  BILITOT 3.7* 3.4* 2.7*  BILIDIR 2.4*  --   --   IBILI 1.3*  --   --    Hepatitis Panel  Recent Labs  11/06/13 0816  HCVAB NEGATIVE    Assessment: 38 year old female with likely cirrhosis of unknown etiology, extensively evaluated during last 2 hospitalizations with negative viral markers, autoimmune work-up, celiac  serologies, ceruloplasmin, and 24 hour urine copper; occult alcohol abuse remains in differential although patient adamantly denies. Previously has refused liver biopsy without supporting reason but is willing to proceed now if necessary.  Paracentesis with 2 liters removed but no fluid analysis. Empirically treat for SBP for total of 7 days.   Acute on chronic anemia without overt signs of GI bleeding. Ultimately needs colonoscopy with EGD as outpatient due to heme positive stool and variceal screening. Hemoccults have been ordered but none on file for this admission. Hgb still in 7 range, but no overt signs of GI bleeding.   Diarrhea:resolved with changing to Lactulose prn.   Appreciate psych consult and recommendations.   Plan: Lactulose prn PPI daily 2 gram Na diet Hemoccult stools Patient willing to proceed with liver biopsy; will hopefully be able to set up for 4/3 Outpatient colonoscopy/EGD  Orvil Feil, ANP-BC [facility] Gastroenter[facility]    LOS: 4 days    11/09/2013, 8:09 AM    ADDENDUM: Liver biopsy to be arranged as outpatient.  Orvil Feil, ANP-BC [facility] Gastroenter[facility]

## 2013-11-09 NOTE — Clinical Social Work Note (Signed)
CSW followed up with pt this morning to set up outpatient re-evaluation as recommended by [facility] assessment yesterday. She states she is not interested in any follow up after hospital stay. CSW encouraged pt to consider allowing appointment to be made so she could think about it for awhile as it would be several weeks out. Pt continues to refuse. CSW discussed with assessment counselor Arby Barrette who faxed several referral pages over in case pt changes her mind after d/c. These were provided to pt. Will sign off, but can be reconsulted if needed.  Benay Pike, [place]

## 2013-11-09 NOTE — Progress Notes (Signed)
UR chart review completed.  

## 2013-11-09 NOTE — Progress Notes (Signed)
REVIEWED. Set up LIVER Bx NEXT WEEK AS AN OUTPATIENT.

## 2013-11-09 NOTE — BHH Counselor (Signed)
TC from pt's RN Santiago Glad. Recommended disposition is outpatient therapy through [facility] but Santiago Glad reports pt refuses to go to outpatient tx. Writer faxed outpatient resources for pt to take home upon d/c. Hopefully pt will change her mind re: outpatient therapy and be open to seeing a therapist or psychiatrist in the near future. Pt denies SI.   The following resources were faxed from Probation officer pt's RN:  [facility] for Pain)  [facility] [address] [place]     [address] Laurina Bustle [place], [place] [address]      [place]  [place]        [facility]      [facility] [place], [place]     [address] [place], [place] [address]      [employer] [phone]        [phone]   [facility]rap[facility] [address]  [address]   [address] [place] [address]   [place], [place] [address]   [place] [address] [phone]    [phone]    [phone]  [facility] [place] [address]    [place], [place] [address]  [place], [place]    [phone]  Psychiatrists [facility]  [facility] [address], [place]   [address], [place], [place] [address]   [place], [place] [address] [phone]     [phone]  Intensive Outpatient Programs [place]    [facility] [address]     [place] [address] [place],  [place], [place]      [phone] Both a day and evening program  [place] [facility]  [facility]stance abuse treatment ctr [address]     [address] [place], [place] [address] [phone]      [phone]  [facility]: Alcohol & Drug Services    [facility] - Intensive Outpatient [place] [address] [place], [place] [address]     [place], [place]       [phone]  Residential Treatment Programs ASAP Residential Treatment    [facility] [address]     [address] [place], [place]      [phone] or [place]     The [address] (Several in [place]) [place] [address]    [place] [address]     [place], [place]      [place]   [facility] (RTS) [place]     [address] [place],  [address]     [place], [place]      [phone] Admissions: 8am-3pm M-F  Self-Help/Support Groups [facility]   Narc[facility] [place] (NA) Variety of support groups    [facility] [phone] (call for more info)    [place] - 2 meetings  at this location  [place] - free/county paid [facility]  [address] [place], [place]  [address] [phone] or [phone] - [email or web address]  Psychiatrists [facility]   [facility] [address] [place]    [address] [place], [place] [address]    [place], [place] [address] [phone]      [phone]  Dr. Norma Fredrickson     [facility] [address]    [place] [address]    [place] [address] [phone]      [phone]  Therapists [facility]    [facility] [address] [place], [place]      [phone]  [facility] Outpatient Services [facility] [address]     [address] [place] [address]    [place],  [place] [address] [place] [address] [phone]  Mobile Crisis Teams [facility]    Assertive  Mobile Crisis Care Unit    Psychotherapeutic Services [phone]     [address] [place], [place]  [place], [place] Assessment Counselor

## 2013-11-09 NOTE — Evaluation (Signed)
Physical Therapy Evaluation Patient Details Name: Wanda [name] MRN: [id number] DOB: 1976-05-20 Today's Date: 11/09/2013   History of Present Illness   Pt was admitted with c/o chronic LE edema.  She was found to have hepatic failure, acute anemia and CHF.  Pt lives with her fiancee and had been independent with ADLs, however recently she has been needing to use a walker for gait.  She states that her legs are very heavy and cannot lift them.  She also states that she has lost a lot of muscle mass due to bedrest here in the hospital (admitted 11-05-13).  Clinical Impression   Pt was seen for evaluation.  She was alert and cooperative with a flat affect.  She is very focused on her LE edema which I would describe as moderate.  A manual muscle test was done and pt initially gave poor effort.  However, with encouragement, she displayed strength to WNL.  Her balance was not formally tested but coordination was WNL.  She needed instruction for correct technique in transferring sit to stand and when done properly she was able to perform independently (pt states that she is unable to stand from sitting).  She needs a walker for gait due to her perceived weakness and was able to ambulate 100' with SBA.   Generally, pt does appear to be deconditioned, especially for her age.  She may be tending to magnify her weakness a bit and hopefully as she becomes more active she can focus on things beyond this.  I am recommending HHPT at d/c and then perhaps progression to OP PT.  We will follow her in house for generalized strengthening.    Follow Up Recommendations Home health PT    Equipment Recommendations  None recommended by PT    Recommendations for Other Services   OT    Precautions / Restrictions Precautions Precautions: Fall Restrictions Weight Bearing Restrictions: No      Mobility  Bed Mobility Overal bed mobility: Modified Independent                Transfers Overall transfer level:  Needs assistance Equipment used: Rolling walker (2 wheeled) Transfers: Sit to/from Stand Sit to Stand: Supervision         General transfer comment: pt needed instruction to transfer weight anteriorly and use UE to help with push off  Ambulation/Gait Ambulation/Gait assistance: Supervision Ambulation Distance (Feet): 100 Feet Assistive device: Rolling walker (2 wheeled) Gait Pattern/deviations: Trunk flexed;Shuffle   Gait velocity interpretation: Below normal speed for age/gender General Gait Details: pt encouraged to increase hip and knee flexion during swing through phase of gait  Stairs N/T                 Balance Overall balance assessment: Needs assistance Sitting-balance support: No upper extremity supported;Feet supported Sitting balance-Leahy Scale: Good     Standing balance support: No upper extremity supported Standing balance-Leahy Scale: Good                                Home Living Family/patient expects to be discharged to:: Private residence Living Arrangements: Spouse/significant other Available Help at Discharge: Family;Available 24 hours/day Type of Home: House Home Access: Stairs to enter Entrance Stairs-Rails: None Entrance Stairs-Number of Steps: 1 Home Layout: One level Home Equipment: Walker - 2 wheels      Prior Function Level of Independence: Independent with assistive device(s)  Extremity/Trunk Assessment               Lower Extremity Assessment: Overall WFL for tasks assessed (pt tends to give poor effort with MMT but does better with encouragement)      Cervical / Trunk Assessment: Normal  Communication   Communication: No difficulties  Cognition Arousal/Alertness: Awake/alert Behavior During Therapy: Flat affect Overall Cognitive Status: Within Functional Limits for tasks assessed                                    Assessment/Plan    PT Assessment  Patient needs continued PT services  PT Diagnosis Difficulty walking (general deconditioning)   PT Problem List Decreased activity tolerance;Decreased mobility  PT Treatment Interventions Gait training;Therapeutic exercise   PT Goals (Current goals can be found in the Care Plan section) Acute Rehab PT Goals Patient Stated Goal: none stated PT Goal Formulation: With patient Time For Goal Achievement: 11/23/13 Potential to Achieve Goals: Good    Frequency Min 3X/week   Barriers to discharge   none                   End of Session Equipment Utilized During Treatment: Gait belt Activity Tolerance: Patient tolerated treatment well Patient left: in bed;with call bell/phone within reach;with nursing/sitter in room;with bed alarm set Nurse Communication: Mobility status         Time: 8466-5993 PT Time Calculation (min): 51 min   Charges:   PT Evaluation $Initial PT Evaluation Tier I: 1 Procedure     PT G CodesDemetrios Isaacs L 11/09/2013, 4:20 PM

## 2013-11-10 DIAGNOSIS — K652 Spontaneous bacterial peritonitis: Secondary | ICD-10-CM

## 2013-11-10 HISTORY — DX: Spontaneous bacterial peritonitis: K65.2

## 2013-11-10 LAB — HEPATIC FUNCTION PANEL
ALK PHOS: 163 U/L — AB (ref 39–117)
ALT: 37 U/L — ABNORMAL HIGH (ref 0–35)
AST: 156 U/L — AB (ref 0–37)
Albumin: 2.2 g/dL — ABNORMAL LOW (ref 3.5–5.2)
BILIRUBIN TOTAL: 2.4 mg/dL — AB (ref 0.3–1.2)
Bilirubin, Direct: 1.5 mg/dL — ABNORMAL HIGH (ref 0.0–0.3)
Indirect Bilirubin: 0.9 mg/dL (ref 0.3–0.9)
TOTAL PROTEIN: 7.2 g/dL (ref 6.0–8.3)

## 2013-11-10 LAB — BASIC METABOLIC PANEL
BUN: 87 mg/dL — ABNORMAL HIGH (ref 6–23)
CO2: 21 mEq/L (ref 19–32)
CREATININE: 1.76 mg/dL — AB (ref 0.50–1.10)
Calcium: 9 mg/dL (ref 8.4–10.5)
Chloride: 95 mEq/L — ABNORMAL LOW (ref 96–112)
GFR, EST AFRICAN AMERICAN: 41 mL/min — AB (ref >90)
GFR, EST NON AFRICAN AMERICAN: 35 mL/min — AB (ref >90)
Glucose, Bld: 126 mg/dL — ABNORMAL HIGH (ref 70–99)
POTASSIUM: 3.7 meq/L (ref 3.7–5.3)
Sodium: 134 mEq/L — ABNORMAL LOW (ref 137–147)

## 2013-11-10 LAB — T3: T3, Total: 72.6 ng/dl — ABNORMAL LOW (ref 80.0–204.0)

## 2013-11-10 LAB — PROTIME-INR
INR: 1.2 (ref 0.00–1.49)
Prothrombin Time: 14.9 seconds (ref 11.6–15.2)

## 2013-11-10 LAB — T4, FREE: Free T4: 1.46 ng/dL (ref 0.80–1.80)

## 2013-11-10 MED ORDER — TORSEMIDE 20 MG PO TABS
50.0000 mg | ORAL_TABLET | Freq: Every day | ORAL | Status: DC
  Administered 2013-11-10: 50 mg via ORAL
  Filled 2013-11-10: qty 3

## 2013-11-10 MED ORDER — SPIRONOLACTONE 25 MG PO TABS
50.0000 mg | ORAL_TABLET | Freq: Every day | ORAL | Status: DC
  Administered 2013-11-10 – 2013-11-17 (×8): 50 mg via ORAL
  Filled 2013-11-10 (×8): qty 2

## 2013-11-10 NOTE — Progress Notes (Signed)
Physical Therapy Treatment Patient Details Name: Wanda [name] MRN: [id number] DOB: 09-05-1975 Today's Date: 10-Dec-2013    History of Present Illness Pt has no c/o.    PT Comments    Pt shows increased general strength today.  Her fiancee is present and pt seems more cheerful.  She was instructed in a home exercise program and was able to do 10-15 repetitions on all.    She was instructed in sit to stand transfer and now is independent with this.  She was able to ambulate 150' with a walker and good stability.  She was given a written home exercise program and appears to understand all teaching.  Follow Up Recommendations     HHPT   Equipment Recommendations    none   Recommendations for Other Services  none     Precautions / Restrictions Precautions Precautions: Fall Restrictions Weight Bearing Restrictions: No    Mobility  Bed Mobility    independent              Transfers   Equipment used: Rolling walker (2 wheeled)   Sit to Stand: Modified independent (Device/Increase time)         General transfer comment: transfer is less labored today  Ambulation/Gait Ambulation/Gait assistance: Supervision Ambulation Distance (Feet): 150 Feet Assistive device: Rolling walker (2 wheeled) Gait Pattern/deviations: WFL(Within Functional Limits)   Gait velocity interpretation: at or above normal speed for age/gender General Gait Details: gait much more fluid with no shuffle of feet   Stairs  N/T                   Balance             Standing balance-Leahy Scale: Good                      Cognition Arousal/Alertness: Awake/alert Behavior During Therapy: WFL for tasks assessed/performed Overall Cognitive Status: Within Functional Limits for tasks assessed                      Exercises General Exercises - Lower Extremity Ankle Circles/Pumps: AROM;Both;10 reps;Supine Short Arc Quad: AROM;Both;15 reps;Supine Heel Slides:  AROM;Both;10 reps;Supine Hip ABduction/ADduction: AROM;Both;10 reps;Supine Straight Leg Raises: AROM;Both;10 reps;Supine Other Exercises Other Exercises: bridging x 10 repetitions    General Comments  Pt is very cooperative                                 Prior Function   independent         PT Goals (current goals can now be found in the care plan section) Progress towards PT goals: Progressing toward goals          PT Plan Current plan remains appropriate                 End of Session Equipment Utilized During Treatment: Gait belt Activity Tolerance: Patient tolerated treatment well Patient left: in chair;with call bell/phone within reach;with family/visitor present     Time: 5003-7048 PT Time Calculation (min): 36 min  Charges:  $Gait Training: 8-22 mins $Therapeutic Exercise: 8-22 mins                    G Codes:      Sable Feil Dec 10, 2013, 2:43 PM

## 2013-11-10 NOTE — Consult Note (Signed)
Reason for Consult:Renal Failure worsening of anasarca Referring Physician: Dr. Lyndal Rainbow Wanda [name] is an 38 y.o. female.  HPI: She is a patient who has history of  chronic liver disease  and  acute kidney injury last seen because of worsening of renal failure and anasarca. At that time patient was started on diuretics and renal function improved and discharged home on the workup for her renal failure was negative except elevated serum IGA and urine with elvated Kappa and lambda chain.. Presently patient came because of that has weakness and feeling tired. Patient also that he for leg swelling has been increasing. Patient denies any difficulty in breathing. Her appetite is not that good she doesn't have any nausea or vomiting. The etiology for her chronic liver disease and liver failure was not clear.  Past Medical History  Diagnosis Date  . GERD (gastroesophageal reflux disease)   . Liver disease 10/05/13    Unknwn etiology. Pt refused biopsy  . Acute renal failure 09/2013    Pre-renal- resolved  . Folate deficiency 09/2013  . Anasarca 10/10/2013    Past Surgical History  Procedure Laterality Date  . None    . Paracentesis  Feb 2015    1180 fluid, negative fluid analysis.     Family History  Problem Relation Age of Onset  . Heart disease Mother   . Colon cancer Neg Hx   . Liver disease Neg Hx     Social History:  reports that she quit smoking about 5 years ago. Her smoking use included Cigarettes. She has a 5 pack-year smoking history. She has never used smokeless tobacco. She reports that she does not drink alcohol or use illicit drugs.  Allergies: No Known Allergies  Medications: I have reviewed the patient's current medications.  Results for orders placed during the hospital encounter of 11/05/13 (from the past 48 hour(s))  CBC     Status: Abnormal   Collection Time    11/09/13  6:09 AM      Result Value Ref Range   WBC 7.4  4.0 - 10.5 K/uL   RBC 2.15 (*) 3.87 - 5.11  MIL/uL   Hemoglobin 7.3 (*) 12.0 - 15.0 g/dL   HCT 22.1 (*) 36.0 - 46.0 %   MCV 102.8 (*) 78.0 - 100.0 fL   MCH 34.0  26.0 - 34.0 pg   MCHC 33.0  30.0 - 36.0 g/dL   RDW 28.6 (*) 11.5 - 15.5 %   Platelets 94 (*) 150 - 400 K/uL   Comment: CONSISTENT WITH PREVIOUS RESULT  COMPREHENSIVE METABOLIC PANEL     Status: Abnormal   Collection Time    11/09/13  6:09 AM      Result Value Ref Range   Sodium 137  137 - 147 mEq/L   Potassium 3.7  3.7 - 5.3 mEq/L   Chloride 98  96 - 112 mEq/L   CO2 24  19 - 32 mEq/L   Glucose, Bld 149 (*) 70 - 99 mg/dL   BUN 87 (*) 6 - 23 mg/dL   Creatinine, Ser 1.59 (*) 0.50 - 1.10 mg/dL   Calcium 9.1  8.4 - 10.5 mg/dL   Total Protein 6.8  6.0 - 8.3 g/dL   Albumin 2.1 (*) 3.5 - 5.2 g/dL   AST 173 (*) 0 - 37 U/L   ALT 35  0 - 35 U/L   Alkaline Phosphatase 167 (*) 39 - 117 U/L   Total Bilirubin 2.7 (*) 0.3 - 1.2 mg/dL  GFR calc non Af Amer 40 (*) >90 mL/min   GFR calc Af Amer 46 (*) >90 mL/min   Comment: (NOTE)     The eGFR has been calculated using the CKD EPI equation.     This calculation has not been validated in all clinical situations.     eGFR's persistently <90 mL/min signify possible Chronic Kidney     Disease.  TSH     Status: Abnormal   Collection Time    11/09/13  6:09 AM      Result Value Ref Range   TSH 7.510 (*) 0.350 - 4.500 uIU/mL   Comment: Please note change in reference range.     Performed at [place] PANEL     Status: Abnormal   Collection Time    11/10/13  6:24 AM      Result Value Ref Range   Sodium 134 (*) 137 - 147 mEq/L   Potassium 3.7  3.7 - 5.3 mEq/L   Chloride 95 (*) 96 - 112 mEq/L   CO2 21  19 - 32 mEq/L   Glucose, Bld 126 (*) 70 - 99 mg/dL   BUN 87 (*) 6 - 23 mg/dL   Creatinine, Ser 1.76 (*) 0.50 - 1.10 mg/dL   Calcium 9.0  8.4 - 10.5 mg/dL   GFR calc non Af Amer 35 (*) >90 mL/min   GFR calc Af Amer 41 (*) >90 mL/min   Comment: (NOTE)     The eGFR has been calculated using the CKD EPI  equation.     This calculation has not been validated in all clinical situations.     eGFR's persistently <90 mL/min signify possible Chronic Kidney     Disease.    No results found.  Review of Systems  Constitutional: Positive for malaise/fatigue.  Respiratory: Negative for shortness of breath and wheezing.   Cardiovascular: Positive for leg swelling.  Gastrointestinal: Negative for nausea, vomiting and abdominal pain.  Neurological: Positive for weakness.   Blood pressure 80/48, pulse 86, temperature 98.7 F (37.1 C), temperature source Oral, resp. rate 18, height 5' 2"  (1.575 m), weight 81.149 kg (178 lb 14.4 oz), last menstrual period 09/12/2013, SpO2 96.00%. Physical Exam  Constitutional: She is oriented to person, place, and time.  Eyes: No scleral icterus.  Neck: No JVD present.  Cardiovascular: Normal rate and regular rhythm.   Respiratory: No respiratory distress. She has no wheezes. She has no rales.  GI: She exhibits distension. There is no tenderness. There is no rebound.  Musculoskeletal: She exhibits edema.  Neurological: She is alert and oriented to person, place, and time.    Assessment/Plan: Problem #1 renal failure: Her presently etiology not clear. It is to be possibly secondary to ATN/prerenal. Presently because of her proteinuria other etiologies cannot ruled out. Problem #2 polyclonal gammopathy: Urine with high Kappa and Lambda chain elevation how ever the ratio of Kappa to Lambda chain is normal. With presence of proteinuria ,Chronic liver disease of unknown etiology possibelity of Paraprotien related disease scuch as Ameloidosis need to be ruled out. Problem #3 anemia: Her hemoglobin and hematocrit is low. Previous studies showed patient with normal iron saturation and high ferritin possibly related to chronic disease associated anemia. As this moment can't ruled out iron deficiency anemia. Problem #4 chronic liver disease/cirrhosis:. Patient is being followed  by GI.  Problem #5 hypokalemia: Potassium has improved Problem #6 anasarca: Patient next week seems to be worsening. Problem #7 possible depression Plan: We'll  check serum to light chain Will start patient on Aldactone 50 mg once a day Will start patient also her on Demadex 40 mg once a day. We'll follow her blood work in [employer].  Amaiah Cristiano S 11/10/2013, 9:32 AM

## 2013-11-10 NOTE — Progress Notes (Signed)
PROGRESS NOTE  Wanda [name] ZDG:[id number] DOB: 11-18-1975 DOA: 11/05/2013 PCP: No PCP Per [facility]  Summary: 38 year old woman with cirrhosis of unclear etiology who presented with bilateral lower extremity edema and generalized weakness. Initial evaluation revealed decompensated cirrhosis, anemia and possible early sepsis, SBP, hepatic encephalopathy.  Assessment/Plan: 1. SIRS/early sepsis. Resolved. Afebrile, no leukocytosis. 2. Possible SBP. Continue empiric treatment. No studies were sent. 3. Hepatic encephalopathy. Resolved at this point. 4. Acute renal failure. Progressively has worsened while off diuretics. Discussed with nephrology, plan to restart diuretics. 5. Cirrhosis of unclear etiology. 24 hour urinary copper minimally elevated without other positive parameters for Wilson's disease. Negative viral markers, negative autoimmune, normal celiac serologies Feb 2015. Paracentesis Feb 2015 with 1180 ml ascitic fluid removed. No significant findings on fluid analysis, but her SAAG score was 1.6 indicating likely portal hypertension. 6. 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 7. Anemia, acute on chronic. Likely secondary to chronic disease with history of folate deficiency. Status post 1 unit packed red blood cells 3/29 and 1 unit 3/31. The patient declined endoscopy in the past. Will need outpatient endoscopy. 8. Possible alcohol use: Patient denies. 9. 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 Psychiatry recommended outpatient followup with the patient declined this. Continue SSRI. 10. Elevated TSH. Normal T4, slightly low T3. Follow-up as an outpatient.   Complete 7 days of antibiotics for SBP, change to oral therapy 4/4.  Restart diuretics per nephrology  Check BMP, CBC in the morning.    Patient agrees to pursue liver biopsy, wants to do as outpatient. We discussed how important as well as to establish diagnosis so that further treatment may be  pursued.  Code Status: full code DVT prophylaxis: SCDs Family Communication: none present Disposition Plan: home when improved  Murray Hodgkins, MD  [facility]  Pager [phone] If 7PM-7AM, please contact night-coverage at [email or web address], password [facility] 11/10/2013, 4:50 PM  LOS: 5 days   Consultants:  GI  Psychiatry  Procedures:  3/30 Large-volume paracentesis with removal of 2 L of fluid.   2-D echocardiogram: Left ventricular ejection fraction 65-70%. Normal wall motion. Diastolic dysfunction noted.  Antibiotics:  Vanc/Zosyn 3/29-4/1  Cefotaxime 4/1 >>   HPI/Subjective: She is feeling somewhat better. Did better with physical therapy, ambulating well. Still has generalized body pain from edema. Minimal abdominal pain.  Objective: Filed Vitals:   11/10/13 0441 11/10/13 1057 11/10/13 1100 11/10/13 1343  BP: 80/48 80/47 90/50  96/61  Pulse: 86 81  89  Temp: 98.7 F (37.1 C)   97.2 F (36.2 C)  TempSrc: Oral   Oral  Resp: 18   18  Height:      Weight:      SpO2: 96%   97%    Intake/Output Summary (Last 24 hours) at 11/10/13 1650 Last data filed at 11/10/13 1344  Gross per 24 hour  Intake    990 ml  Output    250 ml  Net    740 ml     Filed Weights   11/07/13 0500 11/08/13 0500 11/09/13 0451  Weight: 78.2 kg (172 lb 6.4 oz) 78 kg (171 lb 15.3 oz) 81.149 kg (178 lb 14.4 oz)    Exam:   Afebrile, vital signs are stable with borderline hypotension at times. No hypoxia.  Gen. Appears calm and comfortable.  Psychiatric. Grossly normal mood and affect. Speech fluent and appropriate.  Cardiovascular regular rate and rhythm. No murmur, rub or gallop. No change in 3+ bilateral lower extremity edema.  Respiratory clear to auscultation  bilaterally. No wheezes, rales or rhonchi. Normal respiratory effort.  Abdomen soft nontender and mildly distended.  Skin appears grossly unremarkable.  Data Reviewed:  I/O incomplete with multiple voids. No weight  today.  BUN without change, 87. Creatinine somewhat worse 1.76.  Alkaline phosphatase, AST, ALT, total bilirubin without significant change.  Total T3 slightly low. Free T4 normal.  Scheduled Meds: . cefoTAXime (CLAFORAN) IV  2 g Intravenous Q8H  . feeding supplement (ENSURE COMPLETE)  237 mL Oral BID BM  . multivitamin with minerals  1 tablet Oral Daily  . pantoprazole  40 mg Oral Daily  . sertraline  25 mg Oral Daily  . sodium chloride  3 mL Intravenous Q12H  . spironolactone  50 mg Oral Daily  . torsemide  50 mg Oral Daily   Continuous Infusions:   Principal Problem:   Acute blood loss anemia Active Problems:   Jaundice   Ascites   Dehydration   ARF (acute renal failure)   Transaminitis   Hepatopathy   Cirrhosis   SBP (spontaneous bacterial peritonitis)   Time spent 25 minutes

## 2013-11-10 NOTE — Progress Notes (Signed)
Subjective: Since I last evaluated the patient HAD LOW BLOOD PRESSURE. Feels cold. NO ABDOMINAL PAIN. CONTINUES WITH EDEMA IN HER LEGS. WOULD LIKE TO WALK OUTSIDE.  Objective: Vital signs in last 24 hours: Temp:  [97.4 F (36.3 C)-98.7 F (37.1 C)] 98.7 F (37.1 C) (04/03 0441) Pulse Rate:  [81-88] 81 (04/03 1057) Resp:  [18] 18 (04/03 0441) BP: (80-90)/(46-58) 90/50 mmHg (04/03 1100) SpO2:  [96 %] 96 % (04/03 0441) Last BM Date: 11/09/13  Intake/Output from previous day: 04/02 0701 - 04/03 0700 In: 990 [P.O.:840; IV Piggyback:150] Out: 550 [Urine:550] Intake/Output this shift: Total I/O In: 240 [P.O.:240] Out: -   General appearance: alert, cooperative and no distress. SCLERA ICTERIC Resp: clear to auscultation bilaterally Cardio: regular rate and rhythm GI: soft, non-tender; MILDLY DISTENDED, bowel sounds normal;  EXTREMITIES: PALMAR ERYTHEMA, MACULOPAPULAR RASH ON UPPER EXTREMITIES.  Lab Results:  Recent Labs  11/08/13 0718 11/09/13 0609  WBC 8.5 7.4  HGB 7.3* 7.3*  HCT 21.9* 22.1*  PLT 112* 94*   BMET  Recent Labs  11/08/13 0718 11/09/13 0609 11/10/13 0624  NA 138 137 134*  K 3.5* 3.7 3.7  CL 98 98 95*  CO2 26 24 21   GLUCOSE 108* 149* 126*  BUN 90* 87* 87*  CREATININE 1.71* 1.59* 1.76*  CALCIUM 8.6 9.1 9.0   LFT  Recent Labs  11/09/13 0609  PROT 6.8  ALBUMIN 2.1*  AST 173*  ALT 35  ALKPHOS 167*  BILITOT 2.7*   Medications: I have reviewed the patient's current medications.  Assessment/Plan: ADMITTED WITH MENTAL STATUS CHANGES AND ABD PAIN. PRESUMED SBP. ON DAY 5 ABX.  PLAN: 1. CHECK HFP/PT/INR TODAY 2. MONITOR FOR WORSENING RASH 3. PT MAY MOVE AROUND IN HALLWAY WITH A WHEEL CHAIR. DISCUSSED WITH DR. Sarajane Jews. 4. CONTINUE CLAFORAN.    LOS: 5 days   Jesseka Drinkard 11/10/2013, 12:48 PM

## 2013-11-10 NOTE — Progress Notes (Signed)
Patient's B/P 80/47, heart rate 81,says she feels tired,rechecked B/P manual 90/50,Dr Sarajane Jews notified.Will continue to monitor patient fiancee at bedside.No orders received at this time.

## 2013-11-11 ENCOUNTER — Inpatient Hospital Stay (HOSPITAL_COMMUNITY): Payer: Medicare Other

## 2013-11-11 LAB — BASIC METABOLIC PANEL
BUN: 87 mg/dL — AB (ref 6–23)
CO2: 22 meq/L (ref 19–32)
Calcium: 8.9 mg/dL (ref 8.4–10.5)
Chloride: 93 mEq/L — ABNORMAL LOW (ref 96–112)
Creatinine, Ser: 2.21 mg/dL — ABNORMAL HIGH (ref 0.50–1.10)
GFR calc Af Amer: 31 mL/min — ABNORMAL LOW (ref >90)
GFR, EST NON AFRICAN AMERICAN: 27 mL/min — AB (ref >90)
Glucose, Bld: 112 mg/dL — ABNORMAL HIGH (ref 70–99)
Potassium: 4 mEq/L (ref 3.7–5.3)
Sodium: 132 mEq/L — ABNORMAL LOW (ref 137–147)

## 2013-11-11 LAB — CBC
HEMATOCRIT: 21.2 % — AB (ref 36.0–46.0)
Hemoglobin: 7 g/dL — ABNORMAL LOW (ref 12.0–15.0)
MCH: 34.3 pg — AB (ref 26.0–34.0)
MCHC: 33 g/dL (ref 30.0–36.0)
MCV: 103.9 fL — ABNORMAL HIGH (ref 78.0–100.0)
Platelets: 81 10*3/uL — ABNORMAL LOW (ref 150–400)
RBC: 2.04 MIL/uL — ABNORMAL LOW (ref 3.87–5.11)
RDW: 28.4 % — ABNORMAL HIGH (ref 11.5–15.5)
WBC: 6.5 10*3/uL (ref 4.0–10.5)

## 2013-11-11 LAB — CULTURE, BLOOD (ROUTINE X 2)
Culture: NO GROWTH
Culture: NO GROWTH

## 2013-11-11 LAB — PREPARE RBC (CROSSMATCH)

## 2013-11-11 MED ORDER — FUROSEMIDE 10 MG/ML IJ SOLN
160.0000 mg | Freq: Two times a day (BID) | INTRAMUSCULAR | Status: DC
  Administered 2013-11-11: 160 mg via INTRAVENOUS
  Filled 2013-11-11 (×5): qty 16

## 2013-11-11 MED ORDER — FUROSEMIDE 10 MG/ML IJ SOLN: 200.0000 mg | Freq: Two times a day (BID) | INTRAVENOUS | Status: DC

## 2013-11-11 NOTE — Progress Notes (Signed)
PROGRESS NOTE  Naiya Corral ZOX:[id number] DOB: [birth year] DOA: 11/05/2013 PCP: No PCP Per [facility]  Summary: 38 year old woman with cirrhosis of unclear etiology who presented with bilateral lower extremity edema and generalized weakness. Initial evaluation revealed decompensated cirrhosis, anemia and possible early sepsis, SBP, hepatic encephalopathy.  Assessment/Plan: 1. SIRS/early sepsis. Resolved. Afebrile, no leukocytosis. 2. Possible SBP. Continue empiric treatment. No studies were sent. 3. Hepatic encephalopathy. Resolved. 4. Acute renal failure. Somewhat worse today. Continue to monitor, diuretics adjusted today. Renal ultrasound unremarkable. 5. Cirrhosis with anasara/massive volume overload of unclear etiology. 24 hour urinary copper minimally elevated without other positive parameters for Wilson's disease. Negative viral markers, negative autoimmune, normal celiac serologies Feb 2015. Paracentesis Feb 2015 with 1180 ml ascitic fluid removed. No significant findings on fluid analysis, but her SAAG score was 1.6 indicating likely portal hypertension. 6. 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 Continue to monitor. 7. Anemia, acute on chronic. Likely secondary to chronic disease with history of folate deficiency exacerbated by acute illness. Status post 1 unit packed red blood cells 3/29 and 1 unit 3/31. The patient declined endoscopy in the past but the plan is now to pursue EGD in 48 hours. 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 She is alert, oriented and appears to understand her current issues. Psychiatry recommended outpatient followup with the patient declined this. Continue SSRI. 9. Elevated TSH. Normal T4, slightly low T3. Follow-up as an outpatient.   Hepatic function appears to be stable but kidney function declining. Etiology unclear. Unless dramatically improves next 48 hours, may need to consider transfer to a higher level of care for inpatient  biopsy and treatment plan. Prognosis long term remains guarded. She remains with low normal blood pressure overall asymptomatic. No signs or symptoms of sepsis.  Complete 7 days of antibiotics for SBP, last dose 4/5  Diuretics per nephrology, changed to Lasix. Fluid restriction. i requested a Foley catheter but patient refuses. I discussed with her the importance of accurate urine output measurement.  Transfuse PRBC per GI. EGD planned Monday to assess for bleeding.  Patient agrees to pursue liver biopsy.   Code Status: full code DVT prophylaxis: SCDs (thrombocytopenia). Family Communication: none present Disposition Plan: home when improved  Murray Hodgkins, MD  [facility]  Pager [phone] If 7PM-7AM, please contact night-coverage at [email or web address], password [facility] 11/11/2013, 4:12 PM  LOS: 6 days   Consultants:  GI  Psychiatry  Procedures:  3/30 Large-volume paracentesis with removal of 2 L of fluid.   2-D echocardiogram: Left ventricular ejection fraction 65-70%. Normal wall motion. Diastolic dysfunction noted.  Antibiotics:  Vanc/Zosyn 3/29-4/1  Cefotaxime 4/1 >> 4/5  HPI/Subjective: Ambulated with staff this morning when he came generally weak and slipped to her knees. No injury noted. Currently feels okay, complains of generalized pain from whole body swelling.  Objective: Filed Vitals:   11/11/13 0809 11/11/13 1120 11/11/13 1526 11/11/13 1555  BP: 90/61 92/54 84/50  99/52  Pulse: 67 60 88 83  Temp:   97.4 F (36.3 C) 97.9 F (36.6 C)  TempSrc:   Oral Oral  Resp:   18   Height:      Weight:      SpO2:   97%     Intake/Output Summary (Last 24 hours) at 11/11/13 1612 Last data filed at 11/11/13 1056  Gross per 24 hour  Intake    630 ml  Output    626 ml  Net      4 ml     Filed  Weights   11/09/13 0451 11/10/13 1724 11/11/13 0517  Weight: 81.149 kg (178 lb 14.4 oz) 83.8 kg (184 lb 11.9 oz) 84.052 kg (185 lb 4.8 oz)    Exam:   Afebrile, vital  signs are stable with borderline hypotension at times. No hypoxia.  Gen. Appears calm, comfortable.  Psychiatric grossly normal mood and affect. Speech fluent and appropriate. Oriented to self, month, year, location.  Cardiovascular regular rate and rhythm. No murmur, rub or gallop. No change in anasarca, 3+ bilateral lower extremity edema.  Respiratory clear to auscultation bilaterally. No wheezes, rales or rhonchi. Normal respiratory effort.  Abdomen soft nontender and nondistended.  Data Reviewed:  Weight of unclear significance, with widely varying values appears to be several pounds heavier since admission. I/O positive but multiple unmeasured voids. Patient refuses foley.  Creatinine has increased, 2.21. Urine output improved compared to previous day. 625+  Hemoglobin somewhat lower, 7.0. Platelet count 81.  Renal ultrasound unremarkable.  Scheduled Meds: . cefoTAXime (CLAFORAN) IV  2 g Intravenous Q8H  . feeding supplement (ENSURE COMPLETE)  237 mL Oral BID BM  . furosemide  160 mg Intravenous BID  . multivitamin with minerals  1 tablet Oral Daily  . pantoprazole  40 mg Oral Daily  . sertraline  25 mg Oral Daily  . sodium chloride  3 mL Intravenous Q12H  . spironolactone  50 mg Oral Daily   Continuous Infusions:   Principal Problem:   Cirrhosis Active Problems:   Jaundice   Ascites   Dehydration   Acute blood loss anemia   ARF (acute renal failure)   Transaminitis   Hepatopathy   SBP (spontaneous bacterial peritonitis)   Time spent 20 minutes

## 2013-11-11 NOTE — Progress Notes (Signed)
Subjective: Interval History: has complaints increasing leg swelling. Patient however denies any difficulty increasing. She complains of some weakness..  Objective: Vital signs in last 24 hours: Temp:  [97.2 F (36.2 C)-97.5 F (36.4 C)] 97.3 F (36.3 C) (04/04 0517) Pulse Rate:  [80-89] 88 (04/04 0517) Resp:  [16-20] 20 (04/04 0517) BP: (80-96)/(36-61) 80/36 mmHg (04/04 0517) SpO2:  [97 %] 97 % (04/04 0517) Weight:  [83.8 kg (184 lb 11.9 oz)-84.052 kg (185 lb 4.8 oz)] 84.052 kg (185 lb 4.8 oz) (04/04 0517) Weight change:   Intake/Output from previous day: 04/03 0701 - 04/04 0700 In: 990 [P.O.:840; IV Piggyback:150] Out: 626 [Urine:625; Stool:1] Intake/Output this shift:    General appearance: alert, cooperative and no distress Resp: diminished breath sounds bilaterally Cardio: regular rate and rhythm, S1, S2 normal, no murmur, click, rub or gallop GI: soft, non-tender; bowel sounds normal; no masses,  no organomegaly Extremities: edema She has 3+ edema bilaterally.  Lab Results:  Recent Labs  11/09/13 0609 11/11/13 0511  WBC 7.4 6.5  HGB 7.3* 7.0*  HCT 22.1* 21.2*  PLT 94* 81*   BMET:  Recent Labs  11/10/13 0624 11/11/13 0511  NA 134* 132*  K 3.7 4.0  CL 95* 93*  CO2 21 22  GLUCOSE 126* 112*  BUN 87* 87*  CREATININE 1.76* 2.21*  CALCIUM 9.0 8.9   No results found for this basename: PTH,  in the last 72 hours Iron Studies: No results found for this basename: IRON, TIBC, TRANSFERRIN, FERRITIN,  in the last 72 hours  Studies/Results: No results found.  I have reviewed the patient's current medications.  Assessment/Plan: Problem #1 her renal failure: Her BUN is 87 and creatinine is 2.21 renal function seems to be worsening. Patient presently does not have any nausea or vomiting. Problem #2 hypokalemia: Potassium is corrected. Patient presently on Aldactone. Problem #3 anasarca: That seems to be also worsening. Presently she is on Demadex and Aldactone.  She is none oliguric. Problem #4 anemia her hemoglobin and hematocrit is declining. Anemia of chronic disease. Problem #5 liver cirrhosis Problem #6 history of depression Problem #7 proteinuria Plan: DC Demadex Will start patient on Lasix 160 mg IV twice a day We'll put her on 1 L per day fluid restriction We'll check her compressive metabolic panel and phosphorus in the morning.   LOS: 6 days   Shigeko Manard S 11/11/2013,8:48 AM

## 2013-11-11 NOTE — Progress Notes (Signed)
This am,I was called out of the hallway, by nurse tech Alexis,to go into room 308,upon entering the room,patient was seen in floor on her knees at the sink,I asked how did she get there,and patient states," the nurse tech was helping me to get to the mirror so I could see myself,when going I became weak in both my legs,and went down to the floor on my knees,with Alexis helping me to floor". Patient assisted off floor by myself,Yetzali Weld,and Alexis back to bed. While assessing patient, redness noted to her knees, patient says she feels ok,just a little nervous.B/P 91/60,heart rate 67.No broken skin noted to knees,Dr Sarajane Jews notified. Emotional support provided to patient,and I also informed her that she has a mirror on her over bed table,she smiled,and said" I don't know that ". Will continue to monitor patient.

## 2013-11-11 NOTE — Progress Notes (Addendum)
Subjective: Since I last evaluated the patient Hb trending down. NO BRBPR OR MELENA. C/o pain not being adequately controlled. Ambulated in hall yesterday. HAD RENAL U/S THIS AM. FEELING TIRED & COLD. CAN'T LAY ON SIDE BECAUSE IT MAKES HER STOMACH HURT.  Objective: Vital signs in last 24 hours: Temp:  [97.2 F (36.2 C)-97.5 F (36.4 C)] 97.3 F (36.3 C) (04/04 0517) Pulse Rate:  [80-89] 88 (04/04 0517) Resp:  [16-20] 20 (04/04 0517) BP: (80-96)/(36-61) 80/36 mmHg (04/04 0517) SpO2:  [97 %] 97 % (04/04 0517) Weight:  [184 lb 11.9 oz (83.8 kg)-185 lb 4.8 oz (84.052 kg)] 185 lb 4.8 oz (84.052 kg) (04/04 0517) Last BM Date: 11/09/13  Intake/Output from previous day: 04/03 0701 - 04/04 0700 In: 990 [P.O.:840; IV Piggyback:150] Out: 626 [Urine:625; Stool:1] Intake/Output this shift:    General appearance: alert, cooperative and no distress Resp: clear to auscultation bilaterally Cardio: regular rate and rhythm GI: soft, non-tender; DISTENDED; bowel sounds normal; EXTREMITIES: BILATERAL 4+ PITTING EDEMA  Lab Results:  Recent Labs  11/09/13 0609 11/11/13 0511  WBC 7.4 6.5  HGB 7.3* 7.0*  HCT 22.1* 21.2*  PLT 94* 81*   BMET  Recent Labs  11/09/13 0609 11/10/13 0624 11/11/13 0511  NA 137 134* 132*  K 3.7 3.7 4.0  CL 98 95* 93*  CO2 24 21 22   GLUCOSE 149* 126* 112*  BUN 87* 87* 87*  CREATININE 1.59* 1.76* 2.21*  CALCIUM 9.1 9.0 8.9   LFT  Recent Labs  11/10/13 1347  PROT 7.2  ALBUMIN 2.2*  AST 156*  ALT 37*  ALKPHOS 163*  BILITOT 2.4*  BILIDIR 1.5*  IBILI 0.9   PT/INR  Recent Labs  11/10/13 1347  LABPROT 14.9  INR 1.20   Studies/Results: PENDING RENAL U/S  Medications: I have reviewed the patient's current medications.  Assessment/Plan: ADMITTED WITH CIRRHOSIS/ascites/mental status changes. ETIOLOGY UNCLEAR. PT DENIES ETOH USE. LIVER ENZYMES/INR STABLE. ANEMIA MOST LIKELY DUE TO CHRONIC DISEASE, LESS LIKELY STRESS  ULCER/GASTRITIS/ESOPHAGITIS  PLAN: 1. EGD ON MON 2. TRANSFUSE 1upRBCs today. 3. D/c cefotaxime after last dose 4/5.    LOS: 6 days   [facility] 11/11/2013, 10:34 AM

## 2013-11-12 LAB — CBC
HCT: 23.8 % — ABNORMAL LOW (ref 36.0–46.0)
Hemoglobin: 7.9 g/dL — ABNORMAL LOW (ref 12.0–15.0)
MCH: 33.3 pg (ref 26.0–34.0)
MCHC: 33.2 g/dL (ref 30.0–36.0)
MCV: 100.4 fL — AB (ref 78.0–100.0)
PLATELETS: 101 10*3/uL — AB (ref 150–400)
RBC: 2.37 MIL/uL — ABNORMAL LOW (ref 3.87–5.11)
RDW: 27.3 % — AB (ref 11.5–15.5)
WBC: 6.1 10*3/uL (ref 4.0–10.5)

## 2013-11-12 LAB — COMPREHENSIVE METABOLIC PANEL
ALBUMIN: 2.2 g/dL — AB (ref 3.5–5.2)
ALK PHOS: 147 U/L — AB (ref 39–117)
ALT: 33 U/L (ref 0–35)
AST: 122 U/L — ABNORMAL HIGH (ref 0–37)
BUN: 90 mg/dL — ABNORMAL HIGH (ref 6–23)
CALCIUM: 9.1 mg/dL (ref 8.4–10.5)
CO2: 21 mEq/L (ref 19–32)
Chloride: 94 mEq/L — ABNORMAL LOW (ref 96–112)
Creatinine, Ser: 2.3 mg/dL — ABNORMAL HIGH (ref 0.50–1.10)
GFR calc Af Amer: 30 mL/min — ABNORMAL LOW (ref >90)
GFR calc non Af Amer: 26 mL/min — ABNORMAL LOW (ref >90)
Glucose, Bld: 107 mg/dL — ABNORMAL HIGH (ref 70–99)
POTASSIUM: 3.8 meq/L (ref 3.7–5.3)
SODIUM: 132 meq/L — AB (ref 137–147)
TOTAL PROTEIN: 7.3 g/dL (ref 6.0–8.3)
Total Bilirubin: 2.6 mg/dL — ABNORMAL HIGH (ref 0.3–1.2)

## 2013-11-12 LAB — PHOSPHORUS: Phosphorus: 6.2 mg/dL — ABNORMAL HIGH (ref 2.3–4.6)

## 2013-11-12 LAB — PREPARE RBC (CROSSMATCH)

## 2013-11-12 LAB — ALBUMIN: Albumin: 2.2 g/dL — ABNORMAL LOW (ref 3.5–5.2)

## 2013-11-12 MED ORDER — FUROSEMIDE 10 MG/ML IJ SOLN
200.0000 mg | Freq: Two times a day (BID) | INTRAVENOUS | Status: DC
  Administered 2013-11-12 – 2013-11-13 (×3): 200 mg via INTRAVENOUS
  Filled 2013-11-12 (×4): qty 20

## 2013-11-12 MED ORDER — METOLAZONE 5 MG PO TABS
5.0000 mg | ORAL_TABLET | Freq: Two times a day (BID) | ORAL | Status: DC
  Administered 2013-11-12 – 2013-11-17 (×11): 5 mg via ORAL
  Filled 2013-11-12 (×11): qty 1

## 2013-11-12 MED ORDER — LORAZEPAM 0.5 MG PO TABS
0.5000 mg | ORAL_TABLET | Freq: Four times a day (QID) | ORAL | Status: DC | PRN
  Administered 2013-11-12 – 2013-11-17 (×14): 0.5 mg via ORAL
  Filled 2013-11-12 (×14): qty 1

## 2013-11-12 MED ORDER — ALBUMIN HUMAN 25 % IV SOLN
25.0000 g | Freq: Two times a day (BID) | INTRAVENOUS | Status: AC
  Administered 2013-11-12 – 2013-11-13 (×4): 25 g via INTRAVENOUS
  Filled 2013-11-12 (×4): qty 100

## 2013-11-12 NOTE — Progress Notes (Signed)
PROGRESS NOTE  Wanda [name] VOJ:[id number] DOB: [birth year] DOA: 11/05/2013 PCP: No PCP [facility]  Summary: 38 year old woman with cirrhosis of unclear etiology who presented with bilateral lower extremity edema and generalized weakness. Initial evaluation revealed decompensated cirrhosis, anemia and possible early sepsis, SBP, hepatic encephalopathy.  Assessment/Plan: 1. Acute renal failure. Somewhat worse today. Continue to monitor. Renal ultrasound unremarkable. 2. Cirrhosis with anasara/massive volume overload of unclear etiology. 24 hour urinary copper minimally elevated without other positive parameters for Wilson's disease. Negative viral markers, negative autoimmune, normal celiac serologies Feb 2015. Paracentesis Feb 2015 with 1180 ml ascitic fluid removed. No significant findings on fluid analysis, but her SAAG score was 1.6 indicating likely portal hypertension. 3. 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 Stable. 4. Anemia, acute on chronic. Likely secondary to chronic disease with history of folate deficiency exacerbated by acute illness. Status post 1 unit packed red blood cells 3/29, 1 unit 3/31, 1 unit 4/5. The patient declined endoscopy in the past but the plan is now to pursue EGD in 24 hours. 5. SIRS/early sepsis. Resolved. Afebrile, no leukocytosis. 6. Possible SBP. This is empiric treatment today. No studies were sent. 7. Hepatic encephalopathy. Resolved. 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  Psychiatry recommended outpatient followup with the patient declined this. Continue SSRI. 9. Elevated TSH. Normal T4, slightly low T3. Follow-up as an outpatient.   Hepatic function stable, kidney function slowly worsening. Now Foley catheter in place, continue to monitor. Discussed in detail with Dr. Lowanda Foster today, increase Lasix. No role for vasopressors.  Complete 7 days of antibiotics for SBP, last dose 4/5  Diuretics per nephrology. Continue Fluid  restriction and Foley catheter. Renal ultrasound unremarkable.  EGD planned Monday to assess for bleeding.  Plan liver biopsy in near future.  Code Status: full code DVT prophylaxis: SCDs (thrombocytopenia). Family Communication: none present Disposition Plan: home when improved  Murray Hodgkins, MD  [facility]  Pager [phone] If 7PM-7AM, please contact night-coverage at [email or web address], password [facility] 11/12/2013, 10:43 AM  LOS: 7 days   Consultants:  GI  Psychiatry  Procedures:  3/30 Large-volume paracentesis with removal of 2 L of fluid.   2-D echocardiogram: Left ventricular ejection fraction 65-70%. Normal wall motion. Diastolic dysfunction noted.  Antibiotics:  Vanc/Zosyn 3/29-4/1  Cefotaxime 4/1 >> 4/5  HPI/Subjective: No issues overnight. No new complaints. Tolerating diet.  Objective: Filed Vitals:   11/11/13 1940 11/11/13 2020 11/12/13 0348 11/12/13 0954  BP: 90/56 93/60 89/51    Pulse: 84 88 91   Temp: 97.6 F (36.4 C) 97.3 F (36.3 C) 98.2 F (36.8 C)   TempSrc: Oral Oral Oral   Resp: 18 18 18    Height:      Weight:    81.557 kg (179 lb 12.8 oz)  SpO2: 96% 97% 95%     Intake/Output Summary (Last 24 hours) at 11/12/13 1043 Last data filed at 11/12/13 0351  Gross per 24 hour  Intake     50 ml  Output    300 ml  Net   -250 ml     Filed Weights   11/10/13 1724 11/11/13 0517 11/12/13 0954  Weight: 83.8 kg (184 lb 11.9 oz) 84.052 kg (185 lb 4.8 oz) 81.557 kg (179 lb 12.8 oz)    Exam:   Afebrile, vital signs are stable with borderline hypotension at times. No hypoxia.  Gen. Appears calm, comfortable.  Psychiatric grossly normal mood and affect. Speech is fluent and clear.  Cardiovascular regular rate and rhythm. No murmur, rub  or gallop. No change in anasarca, 3+ bilateral lower extremity edema.  Respiratory normal respiratory effort. No wheezes, rales or rhonchi. Clear to auscultation.  Abdomen remains soft, nontender,  nondistended.  Data Reviewed:  Weight of unclear significance, with widely varying values again today. I/O positive but multiple unmeasured voids. Foley catheter in place.  Creatinine has increased, 2.30.   Elevated LFTs without significant change.  Hemoglobin 7.9 after 1 unit of blood. Platelet count stable 101.  Scheduled Meds: . albumin human  25 g Intravenous BID  . cefoTAXime (CLAFORAN) IV  2 g Intravenous Q8H  . feeding supplement (ENSURE COMPLETE)  237 mL Oral BID BM  . furosemide  200 mg Intravenous BID  . metolazone  5 mg Oral BID  . multivitamin with minerals  1 tablet Oral Daily  . pantoprazole  40 mg Oral Daily  . sertraline  25 mg Oral Daily  . sodium chloride  3 mL Intravenous Q12H  . spironolactone  50 mg Oral Daily   Continuous Infusions:   Principal Problem:   Cirrhosis Active Problems:   Jaundice   Ascites   Dehydration   Acute blood loss anemia   ARF (acute renal failure)   Transaminitis   Hepatopathy   SBP (spontaneous bacterial peritonitis)   Time spent 25 minutes

## 2013-11-12 NOTE — Progress Notes (Signed)
MD asked that nursing makes sure to elevate pt's feet and put on heel protectors. Pt has heel protectors on both feet and they are elevated by pillows at this time. Will continue to monitor.

## 2013-11-12 NOTE — Progress Notes (Signed)
Subjective: Interval History: Still she  Feels  weak but  is   getting better. Patient presently denies any difficulty in breathing and no orthopnea. Patient also does not have any nausea or vomiting. Her appetite is reasonable.  Objective: Vital signs in last 24 hours: Temp:  [97.1 F (36.2 C)-98.2 F (36.8 C)] 98.2 F (36.8 C) (04/05 0348) Pulse Rate:  [60-91] 91 (04/05 0348) Resp:  [18-20] 18 (04/05 0348) BP: (84-99)/(37-62) 89/51 mmHg (04/05 0348) SpO2:  [83 %-99 %] 95 % (04/05 0348) Weight change:   Intake/Output from previous day: 04/04 0701 - 04/05 0700 In: 50 [IV Piggyback:50] Out: 300 [Urine:300] Intake/Output this shift:    General appearance: alert, cooperative and no distress Resp: diminished breath sounds bilaterally Cardio: regular rate and rhythm, S1, S2 normal, no murmur, click, rub or gallop GI: soft, non-tender; bowel sounds normal; no masses,  no organomegaly Extremities: edema She has 3+ edema bilaterally.  Lab Results:  Recent Labs  11/11/13 0511 11/12/13 0612  WBC 6.5 6.1  HGB 7.0* 7.9*  HCT 21.2* 23.8*  PLT 81* 101*   BMET:   Recent Labs  11/11/13 0511 11/12/13 0612  NA 132* 132*  K 4.0 3.8  CL 93* 94*  CO2 22 21  GLUCOSE 112* 107*  BUN 87* 90*  CREATININE 2.21* 2.30*  CALCIUM 8.9 9.1   No results found for this basename: PTH,  in the last 72 hours Iron Studies: No results found for this basename: IRON, TIBC, TRANSFERRIN, FERRITIN,  in the last 72 hours  Studies/Results: US Renal  11/11/2013   CLINICAL DATA:  Acute renal failure.  EXAM: RENAL/URINARY TRACT ULTRASOUND COMPLETE  COMPARISON:  US PARACENTESIS dated 11/06/2013; US ABDOMEN COMPLETE dated 09/12/2013; CT ABD - PELV W/ CM dated 09/11/2013  FINDINGS: Right Kidney:  Length: 11.2 cm. Echogenicity within normal limits. No mass or hydronephrosis visualized.  Left Kidney:  Length: 12.5 cm. Echogenicity within normal limits. No mass or hydronephrosis visualized.  Bladder:  Decompressed by a  Foley catheter.  Pelvic ascites noted.  Other: Markedly increased hepatic echogenicity is again noted corresponding with steatosis on CT.  IMPRESSION: 1. Both kidneys remain normal in size and without hydronephrosis. 2. Hepatic steatosis and ascites again noted.   Electronically Signed   By: Camie Patience M.D.   On: 11/11/2013 11:05    I have reviewed the patient's current medications.  Assessment/Plan: Problem #1 her renal failure: Her BUN is 90 and creatinine is 2.3  renal function is worsening. Patient presently does not have any nausea or vomiting. Problem #2 hypokalemia: Potassium is corrected.  Problem #3 anasarca: That seems to be also worsening. Presently she is on lasix . She is none oliguric. Problem #4 anemia her hemoglobin and hematocrit is stable. Anemia of chronic disease. Problem #5 liver cirrhosis Problem #6 history of depression Problem #7 proteinuria Plan: We'll increase Lasix to 200 mg IV twice a day Will start patient on metolazone 5 mg by mouth twice a day We'll check her compressive metabolic panel and phosphorus in the morning.   LOS: 7 days   Mylz Yuan S 11/12/2013,8:27 AM

## 2013-11-12 NOTE — Progress Notes (Signed)
Subjective: Since I last evaluated the patient is feeling a little better. Still c/o discomfort in her abdomen(mild) that prevents her from laying on her side. couldn't work wit PT YESTERDAY DUE TO pRBCs. WANTS HER PAIN MEDS.  Objective: Vital signs in last 24 hours: Temp:  [97.1 F (36.2 C)-98.2 F (36.8 C)] 98.2 F (36.8 C) (04/05 0348) Pulse Rate:  [60-91] 91 (04/05 0348) Resp:  [18-20] 18 (04/05 0348) BP: (84-99)/(37-62) 89/51 mmHg (04/05 0348) SpO2:  [83 %-99 %] 95 % (04/05 0348) Last BM Date: 11/11/13  Intake/Output from previous day: 04/04 0701 - 04/05 0700 In: 50 [IV Piggyback:50] Out: 300 [Urine:300] Intake/Output this shift:    General appearance: alert, cooperative and no distress Resp: clear to auscultation bilaterally Cardio: regular rate and rhythm GI: soft, non-tender; DISTENDED, NO REBOUND OR GUARDING, bowel sounds normal; no masses,  no organomegaly Extremities: edema 4+ BIL LE  Lab Results:  Recent Labs  11/11/13 0511 11/12/13 0612  WBC 6.5 6.1  HGB 7.0* 7.9*  HCT 21.2* 23.8*  PLT 81* 101*   BMET  Recent Labs  11/10/13 0624 11/11/13 0511 11/12/13 0612  NA 134* 132* 132*  K 3.7 4.0 3.8  CL 95* 93* 94*  CO2 21 22 21   GLUCOSE 126* 112* 107*  BUN 87* 87* 90*  CREATININE 1.76* 2.21* 2.30*  CALCIUM 9.0 8.9 9.1   LFT  Recent Labs  11/10/13 1347 11/12/13 0612  PROT 7.2 7.3  ALBUMIN 2.2* 2.2*  AST 156* 122*  ALT 37* 33  ALKPHOS 163* 147*  BILITOT 2.4* 2.6*  BILIDIR 1.5*  --   IBILI 0.9  --    PT/INR  Recent Labs  11/10/13 1347  LABPROT 14.9  INR 1.20  Studies/Results: US Renal  11/11/2013   CLINICAL DATA:  Acute renal failure.  EXAM: RENAL/URINARY TRACT ULTRASOUND COMPLETE  COMPARISON:  US PARACENTESIS dated 11/06/2013; US ABDOMEN COMPLETE dated 09/12/2013; CT ABD - PELV W/ CM dated 09/11/2013  FINDINGS: Right Kidney:  Length: 11.2 cm. Echogenicity within normal limits. No mass or hydronephrosis visualized.  Left Kidney:  Length: 12.5  cm. Echogenicity within normal limits. No mass or hydronephrosis visualized.  Bladder:  Decompressed by a Foley catheter.  Pelvic ascites noted.  Other: Markedly increased hepatic echogenicity is again noted corresponding with steatosis on CT.  IMPRESSION: 1. Both kidneys remain normal in size and without hydronephrosis. 2. Hepatic steatosis and ascites again noted.   Electronically Signed   By: Camie Patience M.D.   On: 11/11/2013 11:05    Medications: I have reviewed the patient's current medications.  Assessment/Plan: ADMITTED WITH RENAL FAILURE AND ANASARCA IN SETTING OF CIRRHOSIS. LIVER ENZYMES STABLE. 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 HB RESPONDED TO pRBC. SBP REMAINS BORDERLINE. NO BRBPR OR MELENA.  PLAN: 1. 1u pRBCS TODAY 2. ELEVATE HEELS OFF BED 3. NPO AFTER MN. EGD APR 6 FOR ANEMIA/VARICEAL SCREENING 4. LV PARACENTESIS APR 6. PT RECEIVING ALBUMIN Q12H FOR 4 DOSES.   LOS: 7 days   Jabri Blancett 11/12/2013, 8:21 AM

## 2013-11-13 ENCOUNTER — Inpatient Hospital Stay (HOSPITAL_COMMUNITY): Payer: Medicare Other

## 2013-11-13 ENCOUNTER — Encounter (HOSPITAL_COMMUNITY): Payer: Self-pay

## 2013-11-13 DIAGNOSIS — E859 Amyloidosis, unspecified: Secondary | ICD-10-CM

## 2013-11-13 DIAGNOSIS — E039 Hypothyroidism, unspecified: Secondary | ICD-10-CM

## 2013-11-13 DIAGNOSIS — D696 Thrombocytopenia, unspecified: Secondary | ICD-10-CM

## 2013-11-13 DIAGNOSIS — N19 Unspecified kidney failure: Secondary | ICD-10-CM

## 2013-11-13 LAB — BASIC METABOLIC PANEL
BUN: 83 mg/dL — ABNORMAL HIGH (ref 6–23)
CALCIUM: 9.1 mg/dL (ref 8.4–10.5)
CO2: 23 meq/L (ref 19–32)
Chloride: 95 mEq/L — ABNORMAL LOW (ref 96–112)
Creatinine, Ser: 1.83 mg/dL — ABNORMAL HIGH (ref 0.50–1.10)
GFR calc Af Amer: 39 mL/min — ABNORMAL LOW (ref >90)
GFR, EST NON AFRICAN AMERICAN: 34 mL/min — AB (ref >90)
GLUCOSE: 97 mg/dL (ref 70–99)
POTASSIUM: 2.6 meq/L — AB (ref 3.7–5.3)
SODIUM: 138 meq/L (ref 137–147)

## 2013-11-13 LAB — TYPE AND SCREEN
ABO/RH(D): A POS
ANTIBODY SCREEN: NEGATIVE
UNIT DIVISION: 0
UNIT DIVISION: 0

## 2013-11-13 LAB — KAPPA/LAMBDA LIGHT CHAINS
Kappa free light chain: 9.42 mg/dL — ABNORMAL HIGH (ref 0.33–1.94)
Kappa, lambda light chain ratio: 1.17 (ref 0.26–1.65)
Lambda free light chains: 8.02 mg/dL — ABNORMAL HIGH (ref 0.57–2.63)

## 2013-11-13 MED ORDER — SODIUM CHLORIDE 0.9 % IJ SOLN
10.0000 mL | Freq: Two times a day (BID) | INTRAMUSCULAR | Status: DC
  Administered 2013-11-13 (×2): 20 mL
  Administered 2013-11-13 – 2013-11-17 (×8): 10 mL

## 2013-11-13 MED ORDER — SODIUM CHLORIDE 0.9 % IJ SOLN
10.0000 mL | INTRAMUSCULAR | Status: DC | PRN
  Administered 2013-11-17: 10 mL

## 2013-11-13 MED ORDER — POTASSIUM CHLORIDE 10 MEQ/100ML IV SOLN
10.0000 meq | INTRAVENOUS | Status: AC
  Administered 2013-11-13 (×4): 10 meq via INTRAVENOUS
  Filled 2013-11-13: qty 100

## 2013-11-13 MED ORDER — POTASSIUM CHLORIDE CRYS ER 20 MEQ PO TBCR
60.0000 meq | EXTENDED_RELEASE_TABLET | Freq: Once | ORAL | Status: AC
  Administered 2013-11-13: 60 meq via ORAL
  Filled 2013-11-13: qty 6

## 2013-11-13 NOTE — Progress Notes (Addendum)
Subjective: Since I last evaluated the patient HER K IS LOW AND SHE HAS LIMITED IV ACCESS. NO BRBPR OR MELENA. NOT ENOUGH FLUID FOR PARACENTETSIS.  Objective: Vital signs in last 24 hours: Temp:  [97.1 F (36.2 C)-98.3 F (36.8 C)] 97.9 F (36.6 C) (04/06 0438) Pulse Rate:  [82-103] 103 (04/06 0438) Resp:  [18-20] 20 (04/06 0438) BP: (88-111)/(56-69) 106/63 mmHg (04/06 0438) SpO2:  [95 %-98 %] 95 % (04/06 0438) Weight:  [180 lb 3.2 oz (81.738 kg)] 180 lb 3.2 oz (81.738 kg) (04/06 0438) Last BM Date: 11/12/13  Intake/Output from previous day: 04/05 0701 - 04/06 0700 In: 1550 [P.O.:350; I.V.:250; Blood:500; IV Piggyback:450] Out: 2200 [Urine:2200] Intake/Output this shift: Total I/O In: 20 [I.V.:20] Out: -   General appearance: alert, cooperative and no distress Resp: clear to auscultation bilaterally Cardio: regular rate and rhythm GI: soft, non-tender; bowel sounds normal;   Lab Results:  Recent Labs  11/11/13 0511 11/12/13 0612  WBC 6.5 6.1  HGB 7.0* 7.9*  HCT 21.2* 23.8*  PLT 81* 101*   BMET  Recent Labs  11/11/13 0511 11/12/13 0612 11/13/13 0506  NA 132* 132* 138  K 4.0 3.8 2.6*  CL 93* 94* 95*  CO2 22 21 23   GLUCOSE 112* 107* 97  BUN 87* 90* 83*  CREATININE 2.21* 2.30* 1.83*  CALCIUM 8.9 9.1 9.1   LFT  Recent Labs  11/10/13 1347 11/12/13 0612 11/12/13 0900  PROT 7.2 7.3  --   ALBUMIN 2.2* 2.2* 2.2*  AST 156* 122*  --   ALT 37* 33  --   ALKPHOS 163* 147*  --   BILITOT 2.4* 2.6*  --   BILIDIR 1.5*  --   --   IBILI 0.9  --   --    PT/INR  Recent Labs  11/10/13 1347  LABPROT 14.9  INR 1.20   Hepatitis Panel No results found for this basename: HEPBSAG, HCVAB, HEPAIGM, HEPBIGM,  in the last 72 hours C-Diff No results found for this basename: CDIFFTOX,  in the last 72 hours Fecal Lactopherrin No results found for this basename: FECLLACTOFRN,  in the last 72 hours  Studies/Results: US Abdomen Limited  11/13/2013   CLINICAL DATA:   Ascites.  Cirrhosis.  EXAM: LIMITED ABDOMEN ULTRASOUND FOR ASCITES  TECHNIQUE: Limited ultrasound survey for ascites was performed in all four abdominal quadrants.  COMPARISON:  None.  US RENAL dated 11/11/2013; US PARACENTESIS dated 11/06/2013  FINDINGS: Small amount of ascites present.  IMPRESSION: Small amount ascites.   Electronically Signed   By: Marcello Moores  Register   On: 11/13/2013 13:03   Dg Chest Port 1 View  11/13/2013   CLINICAL DATA:  PICC line placement  EXAM: PORTABLE CHEST - 1 VIEW  COMPARISON:  10/12/2013.  FINDINGS: Right PICC line is in place. Wire appears to be in place. Tip of the PICC line is at the level of the distal superior vena cava. No gross pneumothorax.  Cardiomegaly.  Pulmonary vascular prominence most notable centrally.  Subsegmental atelectasis lung bases.  IMPRESSION: PICC line tip distal superior vena cava level.  Cardiomegaly.  Pulmonary vascular prominence most notable centrally.  This is a call report.   Electronically Signed   By: Chauncey Cruel M.D.   On: 11/13/2013 11:23    Medications: I have reviewed the patient's current medications.  Assessment/Plan: ADMITTED WITH FLUID OVERLOAD/CIRRHOSIS/RENAL FAILURE. DEVELOPED TRANSFUION DEPENDENT ANEMIA W/O EVIDENCE FOR GI BLEED. K < 3.0 NOW WITH ONGOING REPLACEMENT  PLAN: 1. PICC LINE TODAY  2. EGD WHEN K > 3.5. LIKELY APR 7 3. 2 GM NA DIET.   LOS: 8 days   Raife Lizer 11/13/2013, 1:15 PM

## 2013-11-13 NOTE — Progress Notes (Signed)
Discussed with Threasa Beards in ENDO that the patient is scheduled to have a large dose of po potassium along with IV potassium.  I voiced that the patient had poor access and that I had already spoken to Dr. Sarajane Jews in regards to a PICC.  She said it would be okay to go ahead and give the meds due her EGD will be done this afternoon.  I voiced to her that the IV potassium would be given slow due to her access.  Melanie verbalized understanding.

## 2013-11-13 NOTE — Progress Notes (Signed)
NUTRITION FOLLOW UP  Intervention:   Continue with current plan of care  Nutrition Dx:   Inadequate oral intake related to decreased appetite as evidenced by minimal PO intake; progressing  Goal:   Inadequate oral intake related to decreased appetite as evidenced by miimal PO intake; progressing  Monitor:   PO intake, skin assessments, labs, weight changes, I/O's  Assessment:   Pt continues with edema, which has increased. Noted a 10# (5.9%) wt gain x 1 week, due to edema. Appetite has improved; PO: 25-100. Pt also receives Ensure Complete po BID, each supplement provides 350 kcal and 13 grams of protein. Diet has been advanced to 2 gm NA.  Per MD notes, pt is now agreeable to liver biopsy to further investigate etiology of liver failure (nephrology is also considering abdominal fat biopsy if oncology is agreeable). Per MD,. hepatic function is improving, but renal function is deteriorating.   Height: Ht Readings from Last 1 Encounters:  11/05/13 5\' 2"  (1.575 m)    Weight Status:   Wt Readings from Last 1 Encounters:  11/13/13 180 lb 3.2 oz (81.738 kg)    Re-estimated needs:  Kcal: 2310-2695 daily Protein: 82-103 grams daily Fluid: 2.3-2.7 L daily  Skin: Intact  Diet Order: Sodium Restricted   Intake/Output Summary (Last 24 hours) at 11/13/13 1448 Last data filed at 11/13/13 1055  Gross per 24 hour  Intake    820 ml  Output   2200 ml  Net  -1380 ml    Last BM: 11/13/13   Labs:   Recent Labs Lab 11/11/13 0511 11/12/13 0612 11/13/13 0506  NA 132* 132* 138  K 4.0 3.8 2.6*  CL 93* 94* 95*  CO2 22 21 23   BUN 87* 90* 83*  CREATININE 2.21* 2.30* 1.83*  CALCIUM 8.9 9.1 9.1  PHOS  --  6.2*  --   GLUCOSE 112* 107* 97    CBG (last 3)  No results found for this basename: GLUCAP,  in the last 72 hours  Scheduled Meds: . albumin human  25 g Intravenous BID  . cefoTAXime (CLAFORAN) IV  2 g Intravenous Q8H  . feeding supplement (ENSURE COMPLETE)  237 mL Oral  BID BM  . furosemide  200 mg Intravenous BID  . metolazone  5 mg Oral BID  . multivitamin with minerals  1 tablet Oral Daily  . pantoprazole  40 mg Oral Daily  . [COMPLETED] potassium chloride  10 mEq Intravenous Q1 Hr x 4  . sertraline  25 mg Oral Daily  . sodium chloride  10-40 mL Intracatheter Q12H  . sodium chloride  3 mL Intravenous Q12H  . spironolactone  50 mg Oral Daily    Continuous Infusions:   Britanie Harshman A. Jimmye Norman, RD, LDN Pager: [phone]

## 2013-11-13 NOTE — Progress Notes (Signed)
Subjective: Interval History: Patient is feeling a little better. No difficulty in breathing. Still she has swelling of leg.  Objective: Vital signs in last 24 hours: Temp:  [97.1 F (36.2 C)-98.3 F (36.8 C)] 97.9 F (36.6 C) (04/06 0438) Pulse Rate:  [82-103] 103 (04/06 0438) Resp:  [18-20] 20 (04/06 0438) BP: (88-111)/(56-69) 106/63 mmHg (04/06 0438) SpO2:  [95 %-98 %] 95 % (04/06 0438) Weight:  [81.557 kg (179 lb 12.8 oz)-81.738 kg (180 lb 3.2 oz)] 81.738 kg (180 lb 3.2 oz) (04/06 0438) Weight change:   Intake/Output from previous day: 04/05 0701 - 04/06 0700 In: 1550 [P.O.:350; I.V.:250; Blood:500; IV Piggyback:450] Out: 2200 [Urine:2200] Intake/Output this shift:    General appearance: alert, cooperative and no distress Resp: diminished breath sounds bilaterally Cardio: regular rate and rhythm, S1, S2 normal, no murmur, click, rub or gallop GI: soft, non-tender; bowel sounds normal; no masses,  no organomegaly Extremities: edema She has 3+ edema bilaterally.  Lab Results:  Recent Labs  11/11/13 0511 11/12/13 0612  WBC 6.5 6.1  HGB 7.0* 7.9*  HCT 21.2* 23.8*  PLT 81* 101*   BMET:   Recent Labs  11/12/13 0612 11/13/13 0506  NA 132* 138  K 3.8 2.6*  CL 94* 95*  CO2 21 23  GLUCOSE 107* 97  BUN 90* 83*  CREATININE 2.30* 1.83*  CALCIUM 9.1 9.1   No results found for this basename: PTH,  in the last 72 hours Iron Studies: No results found for this basename: IRON, TIBC, TRANSFERRIN, FERRITIN,  in the last 72 hours  Studies/Results: US Renal  11/11/2013   CLINICAL DATA:  Acute renal failure.  EXAM: RENAL/URINARY TRACT ULTRASOUND COMPLETE  COMPARISON:  US PARACENTESIS dated 11/06/2013; US ABDOMEN COMPLETE dated 09/12/2013; CT ABD - PELV W/ CM dated 09/11/2013  FINDINGS: Right Kidney:  Length: 11.2 cm. Echogenicity within normal limits. No mass or hydronephrosis visualized.  Left Kidney:  Length: 12.5 cm. Echogenicity within normal limits. No mass or hydronephrosis  visualized.  Bladder:  Decompressed by a Foley catheter.  Pelvic ascites noted.  Other: Markedly increased hepatic echogenicity is again noted corresponding with steatosis on CT.  IMPRESSION: 1. Both kidneys remain normal in size and without hydronephrosis. 2. Hepatic steatosis and ascites again noted.   Electronically Signed   By: Camie Patience M.D.   On: 11/11/2013 11:05    I have reviewed the patient's current medications.  Assessment/Plan: Problem #1 her renal failure: Her BUN is 83 and creatinine is 1.83  renal function is imporving. Patient presently does not have any nausea or vomiting. Problem #2 hypokalemia: Potassium is low and declining . Presently already started on iv and po potassium supplement. Problem #3 anasarca: That seems to be also worsening. Presently she is on lasix/Metolazone/Albumin  She had 2200 cc of urine has improved Problem #4 anemia her hemoglobin and hematocrit is stable. Anemia of chronic disease. Problem #5 liver cirrhosis Problem #6 history of depression Problem #7 proteinuria Plan: We will continue with present treatment If patient is not going to have liver biopsy she may require Oncology consult and if oncology is aggreable abdominal fat biopsy may be reasonable next step Basic metabolic panel.   LOS: 8 days   Cadynce Garrette S 11/13/2013,8:55 AM

## 2013-11-13 NOTE — Progress Notes (Signed)
Physical Therapy Treatment Patient Details Name: Wanda [name] MRN: [id number] DOB: Feb 20, 1976 Today's Date: 11/13/2013    PT Comments    Pt found resting in recliner with feet elevated at PTA entry. Pt willing to completes therex and gait training though she does display some hesitation most likely due to fatigue and LE swelling. Pt completes therex well after initial cueing and demo. Pt requires VCs for safety with sit to stand. Gait is steady with front wheeled walker and gait mechanics are WFL. Pt appears to tolerate tx well.  Precautions / Restrictions      Mobility   Transfers Overall transfer level: Modified independent Equipment used: Rolling walker (2 wheeled) Transfers: Sit to/from Stand Sit to Stand: Modified independent (Device/Increase time)            Ambulation/Gait Ambulation/Gait assistance: Modified independent (Device/Increase time) Ambulation Distance (Feet): 120 Feet Assistive device: Rolling walker (2 wheeled) Gait Pattern/deviations: WFL(Within Functional Limits)   Gait velocity interpretation: at or above normal speed for age/gender                Exercises General Exercises - Lower Extremity Ankle Circles/Pumps: AROM;Both;10 reps;Seated (BLE elevated in recliner) Quad Sets: AROM;Both;10 reps;Seated (BLE elevated in recliner) Long Arc Quad: AROM;Both;10 reps;Seated Hip Flexion/Marching: AROM;Both;10 reps;Seated        Pertinent Vitals/Pain Pt reports discomfort in hands from swelling.     PT Goals (current goals can now be found in the care plan section) Progress towards PT goals: Progressing toward goals    PT Plan Current plan remains appropriate    End of Session Equipment Utilized During Treatment: Gait belt Activity Tolerance: Patient tolerated treatment well Patient left: in chair;with call bell/phone within reach;with chair alarm set;with nursing/sitter in room     Time: 0855-0920 PT Time Calculation (min): 25  min  Charges:  $Gait Training: 8-22 mins $Therapeutic Exercise: 8-22 mins                     Rachelle Hora, PTA  11/13/2013, 9:37 AM

## 2013-11-13 NOTE — Progress Notes (Addendum)
PROGRESS NOTE  Wanda [name] RKY:[id number] DOB: 1976/02/22 DOA: 11/05/2013 PCP: No PCP Per [facility]  Summary: 38 year old woman with cirrhosis of unclear etiology who presented with bilateral lower extremity edema and generalized weakness. Initial evaluation revealed decompensated cirrhosis, anemia and possible early sepsis, SBP, hepatic encephalopathy. She was treated empirically with antibiotics for SBP although no fluid studies were sent. Hepatic encephalopathy rapidly resolved. Her hospitalization and prolonged by progressive renal failure and recurrent anemia. With high-dose diuretics kidney function is now improving. Etiology of anemia is unclear but currently stable. EGD planned 4/7. Hematology also consulted, consider fat pad biopsy to rule out amyloidosis. Liver biopsy has been agreed to by the patient and will be planned in the near future. Likely home in the next few days as kidney function improves.  Assessment/Plan: 1. Acute renal failure. Somewhat better today. Renal ultrasound unremarkable. 2. Cirrhosis with anasara/massive volume overload of unclear etiology. Appears stable. 24 hour urinary copper minimally elevated without other positive parameters for Wilson's disease. Negative viral markers, negative autoimmune, normal celiac serologies Feb 2015. Paracentesis Feb 2015 with 1180 ml ascitic fluid removed. No significant findings on fluid analysis, but her SAAG score was 1.6 indicating likely portal hypertension. 3. 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 Stable. 4. Anemia, acute on chronic. Likely secondary to chronic disease exacerbated by acute illness. Status post 1 unit packed red blood cells 3/29, 1 unit 3/31, 1 unit 4/5. The patient declined endoscopy in the past but the plan is now to pursue EGD in 24 hours. 5. SIRS/early sepsis. Resolved. Afebrile, no leukocytosis. 6. Possible SBP. Completed empiric treatment. No studies were sent. 7. Hepatic  encephalopathy. Resolved. 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  Psychiatry recommended outpatient followup with the patient declined this. Continue SSRI. 9. Elevated TSH. Normal T4, slightly low T3. Follow-up as an outpatient.   Overall appears stable with improved blood pressure and dysfunction. Continue diuretics per nephrology.  Replete potassium.  Plan for EGD because of anemia requiring several units of blood.  Discussed with hematology, consider fat pad biopsy to exclude amyloidosis. Consider liver biopsy, patient wishes to pursue as an outpatient if possible.  Code Status: full code DVT prophylaxis: SCDs (thrombocytopenia). Family Communication: none present Disposition Plan: home when improved  Murray Hodgkins, MD  [facility]  Pager [phone] If 7PM-7AM, please contact night-coverage at [email or web address], password [email or web address] 11/13/2013, 6:07 PM  LOS: 8 days   Consultants:  GI  Psychiatry  Procedures:  3/30 Large-volume paracentesis with removal of 2 L of fluid.   2-D echocardiogram: Left ventricular ejection fraction 65-70%. Normal wall motion. Diastolic dysfunction noted.  PICC line 4/6  Antibiotics:  Vanc/Zosyn 3/29-4/1  Cefotaxime 4/1 >> 4/5  HPI/Subjective: Feels about the same today. Has an appetite today. Still has generalized pain.  Objective: Filed Vitals:   11/12/13 2348 11/13/13 0042 11/13/13 0438 11/13/13 1517  BP: 99/56 111/61 106/63 105/66  Pulse: 96 98 103 101  Temp: 97.6 F (36.4 C)  97.9 F (36.6 C) 97.8 F (36.6 C)  TempSrc: Oral  Oral Oral  Resp:   20 20  Height:      Weight:   81.738 kg (180 lb 3.2 oz)   SpO2: 98% 96% 95% 96%    Intake/Output Summary (Last 24 hours) at 11/13/13 1807 Last data filed at 11/13/13 1300  Gross per 24 hour  Intake    690 ml  Output   3700 ml  Net  -3010 ml     Filed Weights   11/11/13  1478 11/12/13 0954 11/13/13 0438  Weight: 84.052 kg (185 lb 4.8 oz) 81.557 kg (179 lb 12.8 oz) 81.738 kg (180  lb 3.2 oz)    Exam:   Afebrile, vital signs stable.  Gen. Sitting on side of the bed eating dinner. Appears calm and comfortable.  Respiratory clear to auscultation bilaterally. No wheezes, rales or rhonchi. Normal respiratory effort.  Cardiovascular regular rate and rhythm. No murmur, rub or gallop. No change in 3+ bilateral lower extremity edema.  Psychiatric. Grossly normal mood and affect. Speech fluent and appropriate.  Data Reviewed:  Weight difficult to interpret but appears to be stable over the last 4 days. Excellent urine output, -2200 last 24 hours.  Creatinine has decreased to 1.83  Scheduled Meds: . albumin human  25 g Intravenous BID  . cefoTAXime (CLAFORAN) IV  2 g Intravenous Q8H  . feeding supplement (ENSURE COMPLETE)  237 mL Oral BID BM  . furosemide  200 mg Intravenous BID  . metolazone  5 mg Oral BID  . multivitamin with minerals  1 tablet Oral Daily  . pantoprazole  40 mg Oral Daily  . sertraline  25 mg Oral Daily  . sodium chloride  10-40 mL Intracatheter Q12H  . sodium chloride  3 mL Intravenous Q12H  . spironolactone  50 mg Oral Daily   Continuous Infusions:   Principal Problem:   Cirrhosis Active Problems:   Jaundice   Ascites   Dehydration   Acute blood loss anemia   ARF (acute renal failure)   Transaminitis   Hepatopathy   SBP (spontaneous bacterial peritonitis)   Time spent 20 minutes

## 2013-11-13 NOTE — Consult Note (Signed)
[facility] Consultation Oncology  Name: Wanda [name]      MRN: [id number]    Location: A308/A308-01  Date: 11/13/2013 Time:7:09 PM   REFERRING PHYSICIAN:  Samuella Cota, MD  REASON FOR CONSULT:  Evaluate for amyloidosis   DIAGNOSIS: Macrocytic, normochromic anemia with elevated RDW with stable thrombocytopenia with probable cirrhosis, anasarca, and renal failure  HISTORY OF PRESENT ILLNESS:   Wanda [name] is a chronically ill appearing woman who is 38 years old with a complicated past medical history including probable cirrhosis of liver versus fatty infiltration of liver, chronic renal failure, and folate deficiency who reported to the ED on 11/05/2013 swollen legs.  She notes that it was progressive over a few months.   The patient's chart is reviewed.   I personally reviewed and went over laboratory results with the patient.  The results are noted within this dictation.  Anemia panel is unimpressive except for iron studies demonstrating an anemia of chronic disease picture.  I personally reviewed and went over radiographic studies with the patient.  The results are noted within this dictation.    Hematology was consulted for evaluation of amyloidosis.  It is noted that the patient's kappa and lambda light chains are both elevated with a normal ration, indicative of reactivity.  No SPEP or IFE is ordered.  I will order one tonight.    The patient denies any complaints tonight except for her fluid in her legs and fingers.   Diagnostic criteria for AL Amyloidosis (Am. J. Hem 2011; 86:57)  A. Presence of amyloid-related systemic syndrome  B. Amyloid staining by Congo Red in any tissue  C. Evidence that amyloid is light-chain related by direct exam of the amyloid  D. Evidence of monoclonal plasma cell proliferative disorder.  Amyloid is a clonal B cell disorder with 98% due to plasma cell disorders with amyloid protein derived from Ig light chain fragments (ratio of Kappa to  Lambda clones is 1:4). Amyloid can occur in the the context of multiple myeloma or MGUS  Primary amyloidosis is associated with a monoclonal kappa or lambda light chain and can affect renal, cardiac, GI, neuro, cutaneous, hepatic, pulmonary, hematology, and musculoskeletal organ systems.  Secondary amyloidosis associated with chronic illness is associated with serum amyloid A protein and can affect renal, GI, hepatic, neuro, and cutaneous organ systems.    PAST MEDICAL HISTORY:   Past Medical History  Diagnosis Date  . GERD (gastroesophageal reflux disease)   . Liver disease 10/05/13    Unknwn etiology. Pt refused biopsy  . Acute renal failure 09/2013    Pre-renal- resolved  . Folate deficiency 09/2013  . Anasarca 10/10/2013    ALLERGIES: No Known Allergies    MEDICATIONS: I have reviewed the patient's current medications.     PAST SURGICAL HISTORY Past Surgical History  Procedure Laterality Date  . None    . Paracentesis  Feb 2015    1180 fluid, negative fluid analysis.     FAMILY HISTORY: Family History  Problem Relation Age of Onset  . Heart disease Mother   . Colon cancer Neg Hx   . Liver disease Neg Hx     SOCIAL HISTORY:  reports that she quit smoking about 5 years ago. Her smoking use included Cigarettes. She has a 5 pack-year smoking history. She has never used smokeless tobacco. She reports that she does not drink alcohol or use illicit drugs.  PERFORMANCE STATUS: The patient's performance status is 3 - Symptomatic, >50% confined to bed  PHYSICAL EXAM: Most Recent Vital Signs: Blood pressure 105/66, pulse 101, temperature 97.8 F (36.6 C), temperature source Oral, resp. rate 20, height 5' 2"  (1.575 m), weight 180 lb 3.2 oz (81.738 kg), last menstrual period 09/12/2013, SpO2 96.00%. General appearance: alert, cooperative, appears older than stated age, no distress and slowed mentation Head: Normocephalic, without obvious abnormality, atraumatic Eyes: positive  findings: exophthalmos of eyes Neck: supple, symmetrical, trachea midline Heart: regular rate and rhythm Extremities: edema 3+ pitting edema B/L in LE Skin: Skin color, texture, turgor normal. No rashes or lesions Neurologic: Grossly normal  LABORATORY DATA:  Results for orders placed during the hospital encounter of 11/05/13 (from the past 48 hour(s))  CBC     Status: Abnormal   Collection Time    11/12/13  6:12 AM      Result Value Ref Range   WBC 6.1  4.0 - 10.5 K/uL   RBC 2.37 (*) 3.87 - 5.11 MIL/uL   Hemoglobin 7.9 (*) 12.0 - 15.0 g/dL   HCT 23.8 (*) 36.0 - 46.0 %   MCV 100.4 (*) 78.0 - 100.0 fL   MCH 33.3  26.0 - 34.0 pg   MCHC 33.2  30.0 - 36.0 g/dL   RDW 27.3 (*) 11.5 - 15.5 %   Platelets 101 (*) 150 - 400 K/uL   Comment: SPECIMEN CHECKED FOR CLOTS     PLATELET COUNT CONFIRMED BY SMEAR  COMPREHENSIVE METABOLIC PANEL     Status: Abnormal   Collection Time    11/12/13  6:12 AM      Result Value Ref Range   Sodium 132 (*) 137 - 147 mEq/L   Potassium 3.8  3.7 - 5.3 mEq/L   Chloride 94 (*) 96 - 112 mEq/L   CO2 21  19 - 32 mEq/L   Glucose, Bld 107 (*) 70 - 99 mg/dL   BUN 90 (*) 6 - 23 mg/dL   Creatinine, Ser 2.30 (*) 0.50 - 1.10 mg/dL   Calcium 9.1  8.4 - 10.5 mg/dL   Total Protein 7.3  6.0 - 8.3 g/dL   Albumin 2.2 (*) 3.5 - 5.2 g/dL   AST 122 (*) 0 - 37 U/L   ALT 33  0 - 35 U/L   Alkaline Phosphatase 147 (*) 39 - 117 U/L   Total Bilirubin 2.6 (*) 0.3 - 1.2 mg/dL   GFR calc non Af Amer 26 (*) >90 mL/min   GFR calc Af Amer 30 (*) >90 mL/min   Comment: (NOTE)     The eGFR has been calculated using the CKD EPI equation.     This calculation has not been validated in all clinical situations.     eGFR's persistently <90 mL/min signify possible Chronic Kidney     Disease.  PHOSPHORUS     Status: Abnormal   Collection Time    11/12/13  6:12 AM      Result Value Ref Range   Phosphorus 6.2 (*) 2.3 - 4.6 mg/dL  ALBUMIN     Status: Abnormal   Collection Time    11/12/13   9:00 AM      Result Value Ref Range   Albumin 2.2 (*) 3.5 - 5.2 g/dL  BASIC METABOLIC PANEL     Status: Abnormal   Collection Time    11/13/13  5:06 AM      Result Value Ref Range   Sodium 138  137 - 147 mEq/L   Potassium 2.6 (*) 3.7 - 5.3 mEq/L   Comment: CRITICAL  RESULT CALLED TO, READ BACK BY AND VERIFIED WITH:     FORTE,L AT 6:50 AM ON 11/13/13 BY FESTERMAN,C   Chloride 95 (*) 96 - 112 mEq/L   CO2 23  19 - 32 mEq/L   Glucose, Bld 97  70 - 99 mg/dL   BUN 83 (*) 6 - 23 mg/dL   Creatinine, Ser 1.83 (*) 0.50 - 1.10 mg/dL   Calcium 9.1  8.4 - 10.5 mg/dL   GFR calc non Af Amer 34 (*) >90 mL/min   GFR calc Af Amer 39 (*) >90 mL/min   Comment: (NOTE)     The eGFR has been calculated using the CKD EPI equation.     This calculation has not been validated in all clinical situations.     eGFR's persistently <90 mL/min signify possible Chronic Kidney     Disease.      RADIOGRAPHY: US Abdomen Limited  11/13/2013   CLINICAL DATA:  Ascites.  Cirrhosis.  EXAM: LIMITED ABDOMEN ULTRASOUND FOR ASCITES  TECHNIQUE: Limited ultrasound survey for ascites was performed in all four abdominal quadrants.  COMPARISON:  None.  US RENAL dated 11/11/2013; US PARACENTESIS dated 11/06/2013  FINDINGS: Small amount of ascites present.  IMPRESSION: Small amount ascites.   Electronically Signed   By: Marcello Moores  Register   On: 11/13/2013 13:03   Dg Chest Port 1 View  11/13/2013   CLINICAL DATA:  PICC line placement  EXAM: PORTABLE CHEST - 1 VIEW  COMPARISON:  10/12/2013.  FINDINGS: Right PICC line is in place. Wire appears to be in place. Tip of the PICC line is at the level of the distal superior vena cava. No gross pneumothorax.  Cardiomegaly.  Pulmonary vascular prominence most notable centrally.  Subsegmental atelectasis lung bases.  IMPRESSION: PICC line tip distal superior vena cava level.  Cardiomegaly.  Pulmonary vascular prominence most notable centrally.  This is a call report.   Electronically Signed   By: Chauncey Cruel M.D.   On: 11/13/2013 11:23       PATHOLOGY:  None  ASSESSMENT:  1. Macrocytic, normochromic anemia with elevated RDW. Anemia panel is unimpressive, but iron studies demonstrate an anemia of chronic disease picture and uncompensated retic count. 2. Hypothyroidism with TSH mildly elevated and T3 minimally low, will need addressed as an outpatient.  Exophthalmos on clinical exam may be the patient's baseline given an elevated TSH ruling out Grave's disease. 3. Probable cirrhosis of liver, patient declined biopsy 4. Renal failure, stable.  Followed by nephrology 5. Anasarca 6. Elevation of Kappa and Lambda light chains with normal ratio. 7. Slowed mentation  Patient Active Problem List   Diagnosis Date Noted  . SBP (spontaneous bacterial peritonitis) 11/10/2013  . Cirrhosis 11/07/2013  . PNA (pneumonia) 10/13/2013  . Metabolic alkalosis 15/12/6977  . Folate deficiency 10/13/2013  . Hepatopathy 10/13/2013  . Anasarca 10/10/2013  . ARF (acute renal failure) 10/06/2013  . Generalized weakness 10/06/2013  . Transaminitis 10/06/2013  . Acute renal failure 10/06/2013  . Anemia 10/06/2013  . Sinus tachycardia 10/06/2013  . Leukocytosis, unspecified 09/12/2013  . Acute blood loss anemia 09/12/2013  . Jaundice 09/11/2013  . Colitis 09/11/2013  . Ascites 09/11/2013  . Dehydration 09/11/2013  . Hypokalemia 09/11/2013     PLAN:  1. I personally reviewed and went over laboratory results with the patient.  The results are noted within this dictation. 2. I personally reviewed and went over radiographic studies with the patient.  The results are noted within this dictation.  3. Chart reviewed 4. Labs ordered for anemia: Erythropoietin level, methylmalonic acid level, homocysteine level 5. Labs ordered due to concerns for amyloidosis and anemia/thrombocytopenia: SPEP with IFE, B2M. 6. Gen Surgery consult for fat pad biopsy for Congo Red Stain testing 7. Clinical suspicion for  amyloidosis is low, given elevation of kappa and lambda light chains with a normal ratio.  However, with the patient's clinical situation and no obvious etiology, we will evaluate for amyloidosis with a fat pad biopsy and further lab testing. 8. Diagnostic criteria for AL Amyloidosis (Am. J. Hem 2011; 86:57)  A. Presence of amyloid-related systemic syndrome  B. Amyloid staining by Congo Red in any tissue  C. Evidence that amyloid is light-chain related by direct exam of the amyloid  D. Evidence of monoclonal plasma cell proliferative disorder.  Amyloid is a clonal B cell disorder with 98% due to plasma cell disorders with amyloid protein derived from Ig light chain fragments (ratio of Kappa to Lambda clones is 1:4). Amyloid can occur in the the context of multiple myeloma or MGUS  Primary amyloidosis is associated with a monoclonal kappa or lambda light chain and can affect renal, cardiac, GI, neuro, cutaneous, hepatic, pulmonary, hematology, and musculoskeletal organ systems.  Secondary amyloidosis associated with chronic illness is associated with serum amyloid A protein and can affect renal, GI, hepatic, neuro, and cutaneous organ systems.  8. Will follow along as an inpatient.    All questions were answered. The patient knows to call the clinic with any problems, questions or concerns. We can certainly see the patient much sooner if necessary.  Patient and plan discussed with Dr. Farrel Gobble and he is in agreement with the aforementioned.    KEFALAS,THOMAS 11/13/2013

## 2013-11-14 ENCOUNTER — Encounter (HOSPITAL_COMMUNITY)
Admission: EM | Disposition: A | Discharge status: Home Health | Payer: Self-pay | Source: Home / Self Care | Attending: Family Medicine

## 2013-11-14 HISTORY — PX: ESOPHAGOGASTRODUODENOSCOPY: SHX5428

## 2013-11-14 LAB — BASIC METABOLIC PANEL
BUN: 70 mg/dL — ABNORMAL HIGH (ref 6–23)
BUN: 62 mg/dL — ABNORMAL HIGH (ref 6–23)
CALCIUM: 9.7 mg/dL (ref 8.4–10.5)
CO2: 29 mEq/L (ref 19–32)
CO2: 29 meq/L (ref 19–32)
Calcium: 9.5 mg/dL (ref 8.4–10.5)
Chloride: 96 mEq/L (ref 96–112)
Chloride: 96 mEq/L (ref 96–112)
Creatinine, Ser: 1.36 mg/dL — ABNORMAL HIGH (ref 0.50–1.10)
Creatinine, Ser: 1.21 mg/dL — ABNORMAL HIGH (ref 0.50–1.10)
GFR calc Af Amer: 56 mL/min — ABNORMAL LOW (ref >90)
GFR calc Af Amer: 64 mL/min — ABNORMAL LOW (ref >90)
GFR calc non Af Amer: 56 mL/min — ABNORMAL LOW (ref >90)
GFR, EST NON AFRICAN AMERICAN: 48 mL/min — AB (ref >90)
GLUCOSE: 103 mg/dL — AB (ref 70–99)
Glucose, Bld: 98 mg/dL (ref 70–99)
POTASSIUM: 3.8 meq/L (ref 3.7–5.3)
Potassium: 2.6 mEq/L — CL (ref 3.7–5.3)
SODIUM: 141 meq/L (ref 137–147)
Sodium: 139 mEq/L (ref 137–147)

## 2013-11-14 LAB — HOMOCYSTEINE: Homocysteine: 28.4 umol/L — ABNORMAL HIGH (ref 4.0–15.4)

## 2013-11-14 LAB — MAGNESIUM: MAGNESIUM: 2 mg/dL (ref 1.5–2.5)

## 2013-11-14 SURGERY — EGD (ESOPHAGOGASTRODUODENOSCOPY)
Anesthesia: Moderate Sedation

## 2013-11-14 MED ORDER — DEXTROSE 5 % IV SOLN
100.0000 mg | Freq: Two times a day (BID) | INTRAVENOUS | Status: DC
  Administered 2013-11-14 – 2013-11-15 (×3): 100 mg via INTRAVENOUS
  Filled 2013-11-14 (×4): qty 10

## 2013-11-14 MED ORDER — FOLIC ACID 1 MG PO TABS
1.0000 mg | ORAL_TABLET | Freq: Every day | ORAL | Status: DC
  Administered 2013-11-15 – 2013-11-17 (×3): 1 mg via ORAL
  Filled 2013-11-14 (×3): qty 1

## 2013-11-14 MED ORDER — LIDOCAINE VISCOUS 2 % MT SOLN
OROMUCOSAL | Status: DC | PRN
  Administered 2013-11-14: 1 via OROMUCOSAL

## 2013-11-14 MED ORDER — STERILE WATER FOR IRRIGATION IR SOLN
Status: DC | PRN
  Administered 2013-11-14: 15:00:00

## 2013-11-14 MED ORDER — MEPERIDINE HCL 100 MG/ML IJ SOLN
INTRAMUSCULAR | Status: AC
  Filled 2013-11-14: qty 2

## 2013-11-14 MED ORDER — MEPERIDINE HCL 100 MG/ML IJ SOLN
INTRAMUSCULAR | Status: DC | PRN
  Administered 2013-11-14 (×2): 25 mg via INTRAVENOUS

## 2013-11-14 MED ORDER — LACTULOSE 10 GM/15ML PO SOLN
10.0000 g | Freq: Every day | ORAL | Status: DC
  Administered 2013-11-14 – 2013-11-15 (×2): 10 g via ORAL
  Filled 2013-11-14 (×3): qty 30

## 2013-11-14 MED ORDER — LIDOCAINE VISCOUS 2 % MT SOLN
OROMUCOSAL | Status: AC
  Filled 2013-11-14: qty 15

## 2013-11-14 MED ORDER — POTASSIUM CHLORIDE 10 MEQ/100ML IV SOLN
10.0000 meq | INTRAVENOUS | Status: AC
  Administered 2013-11-14 (×6): 10 meq via INTRAVENOUS
  Filled 2013-11-14 (×5): qty 100

## 2013-11-14 MED ORDER — MIDAZOLAM HCL 5 MG/5ML IJ SOLN
INTRAMUSCULAR | Status: DC | PRN
  Administered 2013-11-14 (×3): 1 mg via INTRAVENOUS
  Administered 2013-11-14: 2 mg via INTRAVENOUS

## 2013-11-14 MED ORDER — MIDAZOLAM HCL 5 MG/5ML IJ SOLN
INTRAMUSCULAR | Status: AC
  Filled 2013-11-14: qty 10

## 2013-11-14 MED ORDER — POTASSIUM CHLORIDE CRYS ER 20 MEQ PO TBCR
40.0000 meq | EXTENDED_RELEASE_TABLET | ORAL | Status: AC
  Administered 2013-11-14 (×2): 40 meq via ORAL
  Filled 2013-11-14: qty 4
  Filled 2013-11-14: qty 2

## 2013-11-14 NOTE — Progress Notes (Signed)
[facility] PROGRESS NOTE  Wanda [name] EGB:[id number] DOB: 05-20-1976 DOA: 11/05/2013 PCP: No PCP Per Patient  Summary:  38 year old woman with cirrhosis of unclear etiology who presented with bilateral lower extremity edema and generalized weakness. Initial evaluation revealed decompensated cirrhosis, anemia and possible early sepsis, SBP, hepatic encephalopathy. She was treated empirically with antibiotics for SBP although no fluid studies were sent. Hepatic encephalopathy rapidly resolved. Her hospitalization and prolonged by progressive renal failure and recurrent anemia. With high-dose diuretics kidney function is now improving. Etiology of anemia is unclear but currently stable. EGD planned 4/7. Hematology also consulted, consider fat pad biopsy to rule out amyloidosis. Liver biopsy has been agreed to by the patient and will be planned in the near future. Likely home in the next few days as kidney function improves.   Assessment/Plan  1. Acute renal failure. Somewhat better today. Renal ultrasound unremarkable. -  Appreciate nephrology assistance 2. Cirrhosis with anasara/massive volume overload of unclear etiology. Appears stable. 24 hour urinary copper minimally elevated without other positive parameters for Wilson's disease. Negative viral markers, negative autoimmune, normal celiac serologies Feb 2015. Paracentesis Feb 2015 with 1180 ml ascitic fluid removed. No significant findings on fluid analysis, but her SAAG score was 1.6 indicating likely portal hypertension.   -  Continue diuresis with metolazone and lasix gtt -  Goal of about 3L per day -  Consider repeat liver biopsy as outpatient 3. 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 Stable.   4. Anemia, acute on chronic. Likely secondary to chronic disease exacerbated by acute illness. Status post 1 unit packed red blood cells 3/29, 1 unit 3/31, 1 unit 4/5.  -  EGD today -  Fat pad biopsy by general surgery 5. SIRS/early  sepsis. Resolved. Afebrile, no leukocytosis. 6. Possible SBP. Completed empiric treatment. No studies were sent. 7. Hepatic encephalopathy. Resolved. 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 Psychiatry recommended outpatient followup with the patient declined this. Continue SSRI. 9. Sick euthyroid, elevated TSH. Normal T4, slightly low T3.  -  Repeat TFTs in about 4 weeks once clinically improved 10. Hypokalemia due to diuresis.   -  Oral and IV potassium repletion -  Repeat potassium level this afternoon  Diet:  NPO for EGD Access:  PICC  IVF:  none Proph:  SCDs prior to procedure  Code Status: full Family Communication: patient alone Disposition Plan: pending further diuresis, fat pad biopsy, EGD   Consultants:  GI  Psychiatry Nephrology Hematology General surgery Procedures:  3/30 Large-volume paracentesis with removal of 2 L of fluid.  2-D echocardiogram: Left ventricular ejection fraction 65-70%. Normal wall motion. Diastolic dysfunction noted.  PICC line 4/6 Antibiotics:  Vanc/Zosyn 3/29-4/1  Cefotaxime 4/1 >> 4/5  HPI/Subjective:  Patient states that she feels better overall except for walking which is difficult secondary to persistently numb feet and legs. Her swelling has dramatically improved.  Objective: Filed Vitals:   11/14/13 0001 11/14/13 0020 11/14/13 0528 11/14/13 0615  BP: 115/58 118/65  116/70  Pulse: 99 100  100  Temp: 97.6 F (36.4 C) 97.5 F (36.4 C)  97.6 F (36.4 C)  TempSrc: Oral Oral  Oral  Resp: 18 18  20   Height:      Weight:   77.3 kg (170 lb 6.7 oz)   SpO2: 100% 99%  94%    Intake/Output Summary (Last 24 hours) at 11/14/13 1113 Last data filed at 11/14/13 1054  Gross per 24 hour  Intake    240 ml  Output   5450 ml  Net  -5210 ml   Filed Weights   11/12/13 0954 11/13/13 0438 11/14/13 0528  Weight: 81.557 kg (179 lb 12.8 oz) 81.738 kg (180 lb 3.2 oz) 77.3 kg (170 lb 6.7 oz)    Exam:   General:  Causasian female, No  acute distress  HEENT:  NCAT, MMM, mild scleral icterus  Cardiovascular:  RRR, nl S1, S2 no mrg, 2+ pulses, warm extremities  Respiratory:  CTAB, no increased WOB  Abdomen:   NABS, soft, mildly distended, nontender  MSK:   Normal tone and bulk, 3+ soft pitting LEE  Neuro:  Grossly intact, somewhat slow to answer questions  Data Reviewed: Basic Metabolic Panel:  Recent Labs Lab 11/10/13 0624 11/11/13 0511 11/12/13 0612 11/13/13 0506 11/14/13 0512  NA 134* 132* 132* 138 141  K 3.7 4.0 3.8 2.6* 2.6*  CL 95* 93* 94* 95* 96  CO2 21 22 21 23 29   GLUCOSE 126* 112* 107* 97 98  BUN 87* 87* 90* 83* 70*  CREATININE 1.76* 2.21* 2.30* 1.83* 1.36*  CALCIUM 9.0 8.9 9.1 9.1 9.7  MG  --   --   --   --  2.0  PHOS  --   --  6.2*  --   --    Liver Function Tests:  Recent Labs Lab 11/08/13 0718 11/09/13 0609 11/10/13 1347 11/12/13 0612 11/12/13 0900  AST 138* 173* 156* 122*  --   ALT 29 35 37* 33  --   ALKPHOS 155* 167* 163* 147*  --   BILITOT 3.4* 2.7* 2.4* 2.6*  --   PROT 6.3 6.8 7.2 7.3  --   ALBUMIN 1.9* 2.1* 2.2* 2.2* 2.2*   No results found for this basename: LIPASE, AMYLASE,  in the last 168 hours No results found for this basename: AMMONIA,  in the last 168 hours CBC:  Recent Labs Lab 11/08/13 0718 11/09/13 0609 11/11/13 0511 11/12/13 0612  WBC 8.5 7.4 6.5 6.1  HGB 7.3* 7.3* 7.0* 7.9*  HCT 21.9* 22.1* 21.2* 23.8*  MCV 100.9* 102.8* 103.9* 100.4*  PLT 112* 94* 81* 101*   Cardiac Enzymes: No results found for this basename: CKTOTAL, CKMB, CKMBINDEX, TROPONINI,  in the last 168 hours BNP (last 3 results) No results found for this basename: PROBNP,  in the last 8760 hours CBG: No results found for this basename: GLUCAP,  in the last 168 hours  Recent Results (from the past 240 hour(s))  MRSA PCR SCREENING     Status: None   Collection Time    11/05/13  9:23 PM      Result Value Ref Range Status   MRSA by PCR NEGATIVE  NEGATIVE Final   Comment:             The GeneXpert MRSA Assay (FDA     approved for NASAL specimens     only), is one component of a     comprehensive MRSA colonization     surveillance program. It is not     intended to diagnose MRSA     infection nor to guide or     monitor treatment for     MRSA infections.  CULTURE, BLOOD (ROUTINE X 2)     Status: None   Collection Time    11/06/13  8:16 AM      Result Value Ref Range Status   Specimen Description BLOOD RIGHT HAND   Final   Special Requests     Final   Value: BOTTLES DRAWN AEROBIC AND  ANAEROBIC AEB=10CC ANA=8CC   Culture NO GROWTH 5 DAYS   Final   Report Status 11/11/2013 FINAL   Final  CULTURE, BLOOD (ROUTINE X 2)     Status: None   Collection Time    11/06/13  8:32 AM      Result Value Ref Range Status   Specimen Description BLOOD LEFT ANTECUBITAL   Final   Special Requests BOTTLES DRAWN AEROBIC ONLY 5CC   Final   Culture NO GROWTH 5 DAYS   Final   Report Status 11/11/2013 FINAL   Final     Studies: US Abdomen Limited  11/13/2013   CLINICAL DATA:  Ascites.  Cirrhosis.  EXAM: LIMITED ABDOMEN ULTRASOUND FOR ASCITES  TECHNIQUE: Limited ultrasound survey for ascites was performed in all four abdominal quadrants.  COMPARISON:  None.  US RENAL dated 11/11/2013; US PARACENTESIS dated 11/06/2013  FINDINGS: Small amount of ascites present.  IMPRESSION: Small amount ascites.   Electronically Signed   By: Marcello Moores  Register   On: 11/13/2013 13:03   Dg Chest Port 1 View  11/13/2013   CLINICAL DATA:  PICC line placement  EXAM: PORTABLE CHEST - 1 VIEW  COMPARISON:  10/12/2013.  FINDINGS: Right PICC line is in place. Wire appears to be in place. Tip of the PICC line is at the level of the distal superior vena cava. No gross pneumothorax.  Cardiomegaly.  Pulmonary vascular prominence most notable centrally.  Subsegmental atelectasis lung bases.  IMPRESSION: PICC line tip distal superior vena cava level.  Cardiomegaly.  Pulmonary vascular prominence most notable centrally.  This is a  call report.   Electronically Signed   By: Chauncey Cruel M.D.   On: 11/13/2013 11:23    Scheduled Meds: . feeding supplement (ENSURE COMPLETE)  237 mL Oral BID BM  . furosemide  100 mg Intravenous BID  . metolazone  5 mg Oral BID  . multivitamin with minerals  1 tablet Oral Daily  . pantoprazole  40 mg Oral Daily  . potassium chloride  10 mEq Intravenous Q1 Hr x 6  . potassium chloride  40 mEq Oral Q4H  . sertraline  25 mg Oral Daily  . sodium chloride  10-40 mL Intracatheter Q12H  . sodium chloride  3 mL Intravenous Q12H  . spironolactone  50 mg Oral Daily   Continuous Infusions:   Principal Problem:   Cirrhosis Active Problems:   Jaundice   Ascites   Dehydration   Acute blood loss anemia   ARF (acute renal failure)   Transaminitis   Hepatopathy   SBP (spontaneous bacterial peritonitis)    Time spent: 30 min    Yvan Dority, [facility] Pager [phone]. If 7PM-7AM, please contact night-coverage at [email or web address], password [facility] 11/14/2013, 11:13 AM  LOS: 9 days

## 2013-11-14 NOTE — H&P (View-Only) (Signed)
Subjective: Since I last evaluated the patient is feeling a little better. Still c/o discomfort in her abdomen(mild) that prevents her from laying on her side. couldn't work wit PT YESTERDAY DUE TO pRBCs. WANTS HER PAIN MEDS.  Objective: Vital signs in last 24 hours: Temp:  [97.1 F (36.2 C)-98.2 F (36.8 C)] 98.2 F (36.8 C) (04/05 0348) Pulse Rate:  [60-91] 91 (04/05 0348) Resp:  [18-20] 18 (04/05 0348) BP: (84-99)/(37-62) 89/51 mmHg (04/05 0348) SpO2:  [83 %-99 %] 95 % (04/05 0348) Last BM Date: 11/11/13  Intake/Output from previous day: 04/04 0701 - 04/05 0700 In: 50 [IV Piggyback:50] Out: 300 [Urine:300] Intake/Output this shift:    General appearance: alert, cooperative and no distress Resp: clear to auscultation bilaterally Cardio: regular rate and rhythm GI: soft, non-tender; DISTENDED, NO REBOUND OR GUARDING, bowel sounds normal; no masses,  no organomegaly Extremities: edema 4+ BIL LE  Lab Results:  Recent Labs  11/11/13 0511 11/12/13 0612  WBC 6.5 6.1  HGB 7.0* 7.9*  HCT 21.2* 23.8*  PLT 81* 101*   BMET  Recent Labs  11/10/13 0624 11/11/13 0511 11/12/13 0612  NA 134* 132* 132*  K 3.7 4.0 3.8  CL 95* 93* 94*  CO2 21 22 21  GLUCOSE 126* 112* 107*  BUN 87* 87* 90*  CREATININE 1.76* 2.21* 2.30*  CALCIUM 9.0 8.9 9.1   LFT  Recent Labs  11/10/13 1347 11/12/13 0612  PROT 7.2 7.3  ALBUMIN 2.2* 2.2*  AST 156* 122*  ALT 37* 33  ALKPHOS 163* 147*  BILITOT 2.4* 2.6*  BILIDIR 1.5*  --   IBILI 0.9  --    PT/INR  Recent Labs  11/10/13 1347  LABPROT 14.9  INR 1.20  Studies/Results: Us Renal  11/11/2013   CLINICAL DATA:  Acute renal failure.  EXAM: RENAL/URINARY TRACT ULTRASOUND COMPLETE  COMPARISON:  US PARACENTESIS dated 11/06/2013; US ABDOMEN COMPLETE dated 09/12/2013; CT ABD - PELV W/ CM dated 09/11/2013  FINDINGS: Right Kidney:  Length: 11.2 cm. Echogenicity within normal limits. No mass or hydronephrosis visualized.  Left Kidney:  Length: 12.5  cm. Echogenicity within normal limits. No mass or hydronephrosis visualized.  Bladder:  Decompressed by a Foley catheter.  Pelvic ascites noted.  Other: Markedly increased hepatic echogenicity is again noted corresponding with steatosis on CT.  IMPRESSION: 1. Both kidneys remain normal in size and without hydronephrosis. 2. Hepatic steatosis and ascites again noted.   Electronically Signed   By: Bill  Veazey M.D.   On: 11/11/2013 11:05    Medications: I have reviewed the patient's current medications.  Assessment/Plan: ADMITTED WITH RENAL FAILURE AND ANASARCA IN SETTING OF CIRRHOSIS. LIVER ENZYMES STABLE. 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 HB RESPONDED TO pRBC. SBP REMAINS BORDERLINE. NO BRBPR OR MELENA.  PLAN: 1. 1u pRBCS TODAY 2. ELEVATE HEELS OFF BED 3. NPO AFTER MN. EGD APR 6 FOR ANEMIA/VARICEAL SCREENING 4. LV PARACENTESIS APR 6. PT RECEIVING ALBUMIN Q12H FOR 4 DOSES.   LOS: 7 days   Darianne Muralles 11/12/2013, 8:21 AM   

## 2013-11-14 NOTE — Progress Notes (Signed)
  Patient Details  Name: Vaudine Dutan MRN: [id number] Date of Birth: [birth year]  Today's Date: 11/14/2013 Time:  1530- 2111   Visit#: no treatment pt is in surgery   Yashica Sterbenz,CINDY 11/14/2013, 3:39 PM

## 2013-11-14 NOTE — Progress Notes (Signed)
REVIEWED. CIPRO DAILY FOR SBP PROPHYLAXIS.

## 2013-11-14 NOTE — Interval H&P Note (Signed)
History and Physical Interval Note:  11/14/2013 3:09 PM  Wanda [name]  has presented today for surgery, with the diagnosis of TRANSFUSION DEPENDENT ANEMIA  The various methods of treatment have been discussed with the patient and family. After consideration of risks, benefits and other options for treatment, the patient has consented to  Procedure(s): ESOPHAGOGASTRODUODENOSCOPY (EGD) (N/A) as a surgical intervention .  The patient's history has been reviewed, patient examined, no change in status, stable for surgery.  I have reviewed the patient's chart and labs.  Questions were answered to the patient's satisfaction.     [employer]

## 2013-11-14 NOTE — Progress Notes (Signed)
Subjective: Interval History: Patient denies any difficulty i breathing. Feels sleepy. No new complaint  Objective: Vital signs in last 24 hours: Temp:  [97.3 F (36.3 C)-97.8 F (36.6 C)] 97.6 F (36.4 C) (04/07 0615) Pulse Rate:  [97-101] 100 (04/07 0615) Resp:  [18-20] 20 (04/07 0615) BP: (105-118)/(58-70) 116/70 mmHg (04/07 0615) SpO2:  [94 %-100 %] 94 % (04/07 0615) Weight:  [77.3 kg (170 lb 6.7 oz)] 77.3 kg (170 lb 6.7 oz) (04/07 0528) Weight change: -4.257 kg (-9 lb 6.2 oz)  Intake/Output from previous day: 04/06 0701 - 04/07 0700 In: 260 [P.O.:240; I.V.:20] Out: 5450 [Urine:5450] Intake/Output this shift:    General appearance: alert, cooperative and no distress Resp: diminished breath sounds bilaterally Cardio: regular rate and rhythm, S1, S2 normal, no murmur, click, rub or gallop GI: soft, non-tender; bowel sounds normal; no masses,  no organomegaly Extremities: edema She has 3+ edema bilaterally.  Lab Results:  Recent Labs  11/12/13 0612  WBC 6.1  HGB 7.9*  HCT 23.8*  PLT 101*   BMET:   Recent Labs  11/13/13 0506 11/14/13 0512  NA 138 141  K 2.6* 2.6*  CL 95* 96  CO2 23 29  GLUCOSE 97 98  BUN 83* 70*  CREATININE 1.83* 1.36*  CALCIUM 9.1 9.7   No results found for this basename: PTH,  in the last 72 hours Iron Studies: No results found for this basename: IRON, TIBC, TRANSFERRIN, FERRITIN,  in the last 72 hours  Studies/Results: US Abdomen Limited  11/13/2013   CLINICAL DATA:  Ascites.  Cirrhosis.  EXAM: LIMITED ABDOMEN ULTRASOUND FOR ASCITES  TECHNIQUE: Limited ultrasound survey for ascites was performed in all four abdominal quadrants.  COMPARISON:  None.  US RENAL dated 11/11/2013; US PARACENTESIS dated 11/06/2013  FINDINGS: Small amount of ascites present.  IMPRESSION: Small amount ascites.   Electronically Signed   By: Marcello Moores  Register   On: 11/13/2013 13:03   Dg Chest Port 1 View  11/13/2013   CLINICAL DATA:  PICC line placement  EXAM: PORTABLE  CHEST - 1 VIEW  COMPARISON:  10/12/2013.  FINDINGS: Right PICC line is in place. Wire appears to be in place. Tip of the PICC line is at the level of the distal superior vena cava. No gross pneumothorax.  Cardiomegaly.  Pulmonary vascular prominence most notable centrally.  Subsegmental atelectasis lung bases.  IMPRESSION: PICC line tip distal superior vena cava level.  Cardiomegaly.  Pulmonary vascular prominence most notable centrally.  This is a call report.   Electronically Signed   By: Chauncey Cruel M.D.   On: 11/13/2013 11:23    I have reviewed the patient's current medications.  Assessment/Plan: Problem #1 her renal failure: Her BUN is 70 and creatinine is 1.36  renal function is imporving. Patient presently does not have any nausea or vomiting. Problem #2 hypokalemia: Potassium is low but stable . Presently already started on iv and po potassium supplement. Problem #3 anasarca: Improving. Presently she is on lasix/Metolazone/Albumin  She had 5.5 liters of out put  has improved Problem #4 anemia her hemoglobin and hematocrit is stable. Anemia of chronic disease. Problem #5 liver cirrhosis Problem #6 history of depression Problem #7 proteinuria Plan: Decrease lasix to 100 mg iv bid Basic metabolic panel.   LOS: 9 days   Joni Norrod S 11/14/2013,8:36 AM

## 2013-11-14 NOTE — Op Note (Signed)
[facility] [address] [place], [address]   ENDOSCOPY PROCEDURE REPORT  PATIENT: Wanda, [name]  MR#: [id number] BIRTHDATE: 1976/07/24 , 37  yrs. old GENDER: Female  ENDOSCOPIST: Barney Drain, MD REFERRED BY:  PROCEDURE DATE: 11/14/2013 PROCEDURE:   EGD w/ biopsy  INDICATIONS:transfusion dependent anemia and cirrhosis-screen for varices. MEDICATIONS: Versed 5 mg IV and Demerol 50 mg IV TOPICAL ANESTHETIC:   Viscous Xylocaine  DESCRIPTION OF PROCEDURE:     Physical exam was performed.  Informed consent was obtained from the patient after explaining the benefits, risks, and alternatives to the procedure.  The patient was connected to the monitor and placed in the left lateral position.  Continuous oxygen was provided by nasal cannula and IV medicine administered through an indwelling cannula.  After administration of sedation, the patients esophagus was intubated and the EG-[id number] ([id number])  endoscope was advanced under direct visualization to the second portion of the duodenum.  The scope was removed slowly by carefully examining the color, texture, anatomy, and integrity of the mucosa on the way out.  The patient was recovered in endoscopy and discharged home in satisfactory condition.   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 Moderate erosive gastritis (inflammation) was found in the gastric antrum.  Multiple biopsies were performed using cold forceps.  PT HAD ACTIVE OOZING FROM HER BIOPSYS SITES FOR  2-3 MINS. SPONTANEOUS HEOMSTASIS ACHIEVED. DUODENUM: The duodenal mucosa showed no abnormalities in the bulb and second portion of the duodenum. NO VARICES IN THE CARDIA, FUNDUS, OR DUODENUM.  COMPLICATIONS:   None  ENDOSCOPIC IMPRESSION: 1.   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 2.   MODERATE PORTAL GASTROPATHY IN PROXIMAL STOMACH. 3.   MODERATE erosive  gastritis  RECOMMENDATIONS: DAILY PPI MONITOR FOR S/SX OF ACTIVE GI BLEED: MELENA OR BRBPR AWAIT BIOPSY 2GM Na DIET CIPRO DAILY FOR SBP PROPHYLAXIS   REPEAT EXAM:   _______________________________ Lorrin MaisBarney Drain, MD 11/14/2013 3:50 PM

## 2013-11-14 NOTE — Progress Notes (Signed)
Subjective: Patient agreeable to liver biopsy now.  As a result, we will hold off on fat pad biopsy.  Liver biopsy will need to be tested with Congo Red Stain to evaluate for Amyloid.  If negative, the patient may still require fat pad biopsy as liver may not be involved with Amyloid.    Objective: Vital signs in last 24 hours: Temp:  [96.6 F (35.9 C)-98.1 F (36.7 C)] 97.4 F (36.3 C) (04/07 1639) Pulse Rate:  [91-105] 102 (04/07 1639) Resp:  [16-22] 18 (04/07 1639) BP: (103-131)/(57-73) 119/70 mmHg (04/07 1639) SpO2:  [91 %-100 %] 91 % (04/07 1639) Weight:  [170 lb 6.7 oz (77.3 kg)] 170 lb 6.7 oz (77.3 kg) (04/07 0528)  Intake/Output from previous day: 04/06 0800 - 04/07 0759 In: 260 [P.O.:240; I.V.:20] Out: 5450 [Urine:5450] Intake/Output this shift: Total I/O In: 0  Out: 900 [Urine:900]  General appearance: appears older than stated age  Lab Results:   Recent Labs  11/12/13 0612  WBC 6.1  HGB 7.9*  HCT 23.8*  PLT 101*   BMET  Recent Labs  11/14/13 0512 11/14/13 1354  NA 141 139  K 2.6* 3.8  CL 96 96  CO2 29 29  GLUCOSE 98 103*  BUN 70* 62*  CREATININE 1.36* 1.21*  CALCIUM 9.7 9.5    Studies/Results: US Abdomen Limited  11/13/2013   CLINICAL DATA:  Ascites.  Cirrhosis.  EXAM: LIMITED ABDOMEN ULTRASOUND FOR ASCITES  TECHNIQUE: Limited ultrasound survey for ascites was performed in all four abdominal quadrants.  COMPARISON:  None.  US RENAL dated 11/11/2013; US PARACENTESIS dated 11/06/2013  FINDINGS: Small amount of ascites present.  IMPRESSION: Small amount ascites.   Electronically Signed   By: Marcello Moores  Register   On: 11/13/2013 13:03   Dg Chest Port 1 View  11/13/2013   CLINICAL DATA:  PICC line placement  EXAM: PORTABLE CHEST - 1 VIEW  COMPARISON:  10/12/2013.  FINDINGS: Right PICC line is in place. Wire appears to be in place. Tip of the PICC line is at the level of the distal superior vena cava. No gross pneumothorax.  Cardiomegaly.  Pulmonary vascular  prominence most notable centrally.  Subsegmental atelectasis lung bases.  IMPRESSION: PICC line tip distal superior vena cava level.  Cardiomegaly.  Pulmonary vascular prominence most notable centrally.  This is a call report.   Electronically Signed   By: Chauncey Cruel M.D.   On: 11/13/2013 11:23    Medications: I have reviewed the patient's current medications.  Assessment/Plan: 1. Macrocytic, normochromic anemia with elevated RDW. Anemia panel is unimpressive, but iron studies demonstrate an anemia of chronic disease picture and uncompensated retic count.  CBC diff ordered for tomorrow. 2. Hypothyroidism with TSH mildly elevated and T3 minimally low, will need addressed as an outpatient. Exophthalmos on clinical exam may be the patient's baseline given an elevated TSH ruling out Grave's disease.  3. Probable cirrhosis of liver, patient now agreeable to biopsy.  Please test sample for Red Congo Stain.  If negative for amyloid, this does not rule out the disease as the liver may not be involved with Amyloid.  Will cancel Gen Surg consult for now given liver biopsy is upcoming. 4. Renal failure, stable. Followed by nephrology  5. Anasarca  6. Elevation of Kappa and Lambda light chains with normal ratio, decreasing likelihood of amyloidosis.  7. Slowed mentation 8. Hyperhomocytenemia, started on Folic acid 1 mg daily and this should be continued as an outpatient.   Patient and plan  discussed with Dr. Farrel Gobble and he is in agreement with the aforementioned.     LOS: 9 days    KEFALAS,THOMAS 11/14/2013

## 2013-11-14 NOTE — Progress Notes (Signed)
Subjective: Denies abdominal pain. NPO for EGD today. No rectal bleeding or diarrhea.   Objective: Vital signs in last 24 hours: Temp:  [97.3 F (36.3 C)-97.8 F (36.6 C)] 97.6 F (36.4 C) (04/07 0615) Pulse Rate:  [97-101] 100 (04/07 0615) Resp:  [18-20] 20 (04/07 0615) BP: (105-118)/(58-70) 116/70 mmHg (04/07 0615) SpO2:  [94 %-100 %] 94 % (04/07 0615) Weight:  [170 lb 6.7 oz (77.3 kg)] 170 lb 6.7 oz (77.3 kg) (04/07 0528) Last BM Date: 11/13/13 General:   Alert and oriented, flat affect Head:  Normocephalic and atraumatic. Eyes:  icterus Heart:  S1, S2 present, no murmurs noted.  Lungs: Clear to auscultation bilaterally Abdomen:  Bowel sounds present, non-tender, full. Hepatomegaly  Extremities:  2-3+ lower extremity pitting edema Neurologic:  Alert and  oriented x4;  grossly normal neurologically. Psych:  Alert and cooperative. Flat affect.   Intake/Output from previous day: 04/06 0701 - 04/07 0700 In: 260 [P.O.:240; I.V.:20] Out: 5450 [Urine:5450] Intake/Output this shift:    Lab Results:  Recent Labs  11/12/13 0612  WBC 6.1  HGB 7.9*  HCT 23.8*  PLT 101*   BMET  Recent Labs  11/12/13 0612 11/13/13 0506 11/14/13 0512  NA 132* 138 141  K 3.8 2.6* 2.6*  CL 94* 95* 96  CO2 21 23 29   GLUCOSE 107* 97 98  BUN 90* 83* 70*  CREATININE 2.30* 1.83* 1.36*  CALCIUM 9.1 9.1 9.7   LFT  Recent Labs  11/12/13 0612 11/12/13 0900  PROT 7.3  --   ALBUMIN 2.2* 2.2*  AST 122*  --   ALT 33  --   ALKPHOS 147*  --   BILITOT 2.6*  --      Studies/Results: US Abdomen Limited  11/13/2013   CLINICAL DATA:  Ascites.  Cirrhosis.  EXAM: LIMITED ABDOMEN ULTRASOUND FOR ASCITES  TECHNIQUE: Limited ultrasound survey for ascites was performed in all four abdominal quadrants.  COMPARISON:  None.  US RENAL dated 11/11/2013; US PARACENTESIS dated 11/06/2013  FINDINGS: Small amount of ascites present.  IMPRESSION: Small amount ascites.   Electronically Signed   By: Marcello Moores   Register   On: 11/13/2013 13:03   Dg Chest Port 1 View  11/13/2013   CLINICAL DATA:  PICC line placement  EXAM: PORTABLE CHEST - 1 VIEW  COMPARISON:  10/12/2013.  FINDINGS: Right PICC line is in place. Wire appears to be in place. Tip of the PICC line is at the level of the distal superior vena cava. No gross pneumothorax.  Cardiomegaly.  Pulmonary vascular prominence most notable centrally.  Subsegmental atelectasis lung bases.  IMPRESSION: PICC line tip distal superior vena cava level.  Cardiomegaly.  Pulmonary vascular prominence most notable centrally.  This is a call report.   Electronically Signed   By: Chauncey Cruel M.D.   On: 11/13/2013 11:23    Assessment: 38 year old female with likely cirrhosis of unknown etiology, extensively evaluated during last 2 hospitalizations with negative viral markers, autoimmune work-up, celiac serologies, ceruloplasmin, and 24 hour urine copper; admitted with anasarca, acute renal failure. Empirically treated for SBP this admission, abx completed.   Acute on chronic anemia without overt signs of GI bleeding. Will proceed with EGD today; potassium replacements ordered per attending and PICC line in place. Consider colonoscopy as outpatient.  ARF: followed by Nephrology. Improving  Concern for amyloidosis: Hematology consulted  Plan: EGD today with Dr. Oneida Alar. Remain NPO PPI daily Resume 2 gram Na diet after EGD Potassium replacements per attending  Orvil Feil, ANP-BC [facility]    LOS: 9 days    11/14/2013, 7:51 AM

## 2013-11-15 ENCOUNTER — Encounter: Payer: Self-pay | Admitting: Gastroenterology

## 2013-11-15 LAB — BASIC METABOLIC PANEL
BUN: 53 mg/dL — AB (ref 6–23)
CHLORIDE: 98 meq/L (ref 96–112)
CO2: 32 meq/L (ref 19–32)
Calcium: 9.5 mg/dL (ref 8.4–10.5)
Creatinine, Ser: 1.08 mg/dL (ref 0.50–1.10)
GFR calc Af Amer: 74 mL/min — ABNORMAL LOW (ref >90)
GFR calc non Af Amer: 64 mL/min — ABNORMAL LOW (ref >90)
GLUCOSE: 90 mg/dL (ref 70–99)
POTASSIUM: 3.2 meq/L — AB (ref 3.7–5.3)
Sodium: 142 mEq/L (ref 137–147)

## 2013-11-15 LAB — CBC WITH DIFFERENTIAL/PLATELET
Basophils Absolute: 0.1 10*3/uL (ref 0.0–0.1)
Basophils Relative: 2 % — ABNORMAL HIGH (ref 0–1)
EOS ABS: 0.1 10*3/uL (ref 0.0–0.7)
Eosinophils Relative: 1 % (ref 0–5)
HEMATOCRIT: 28 % — AB (ref 36.0–46.0)
HEMOGLOBIN: 9.3 g/dL — AB (ref 12.0–15.0)
LYMPHS PCT: 22 % (ref 12–46)
Lymphs Abs: 1.4 10*3/uL (ref 0.7–4.0)
MCH: 33.3 pg (ref 26.0–34.0)
MCHC: 33.2 g/dL (ref 30.0–36.0)
MCV: 100.4 fL — ABNORMAL HIGH (ref 78.0–100.0)
MONO ABS: 0.8 10*3/uL (ref 0.1–1.0)
Monocytes Relative: 13 % — ABNORMAL HIGH (ref 3–12)
NEUTROS ABS: 4 10*3/uL (ref 1.7–7.7)
Neutrophils Relative %: 62 % (ref 43–77)
Platelets: 219 10*3/uL (ref 150–400)
RBC: 2.79 MIL/uL — ABNORMAL LOW (ref 3.87–5.11)
RDW: 25.7 % — ABNORMAL HIGH (ref 11.5–15.5)
WBC: 6.4 10*3/uL (ref 4.0–10.5)

## 2013-11-15 MED ORDER — CIPROFLOXACIN HCL 250 MG PO TABS
500.0000 mg | ORAL_TABLET | Freq: Every day | ORAL | Status: DC
  Administered 2013-11-15 – 2013-11-17 (×3): 500 mg via ORAL
  Filled 2013-11-15 (×3): qty 2

## 2013-11-15 MED ORDER — POTASSIUM CHLORIDE CRYS ER 20 MEQ PO TBCR
40.0000 meq | EXTENDED_RELEASE_TABLET | Freq: Two times a day (BID) | ORAL | Status: DC
  Administered 2013-11-15 – 2013-11-16 (×4): 40 meq via ORAL
  Filled 2013-11-15 (×6): qty 2

## 2013-11-15 NOTE — Progress Notes (Addendum)
Subjective: Denies abdominal pain, overt signs of GI bleeding. Hungry. Agreeable to outpatient liver biopsy.   Objective: Vital signs in last 24 hours: Temp:  [96.6 F (35.9 C)-98.1 F (36.7 C)] 97.9 F (36.6 C) (04/08 0438) Pulse Rate:  [91-108] 108 (04/08 0438) Resp:  [16-22] 20 (04/08 0438) BP: (103-131)/(57-75) 105/64 mmHg (04/08 0438) SpO2:  [91 %-98 %] 95 % (04/08 0438) Weight:  [167 lb (75.751 kg)-174 lb (78.926 kg)] 167 lb (75.751 kg) (04/08 0500) Last BM Date: 11/13/13 General:   Alert and oriented, flat affect. Jaundiced Head:  Normocephalic and atraumatic. Eyes:  Scleral icterus Abdomen:  Bowel sounds present, soft, obese, non-tender, non-distended. Hepatomegaly Extremities:  2+ lower extremity edema Neurologic:  Alert and  oriented x4;  grossly normal neurologically. Psych:  Alert and cooperative. Flat, depressed affect.   Intake/Output from previous day: 04/07 0701 - 04/08 0700 In: 0  Out: 3800 [Urine:3800] Intake/Output this shift:    Lab Results:  Recent Labs  11/15/13 0439  WBC 6.4  HGB 9.3*  HCT 28.0*  PLT 219   BMET  Recent Labs  11/14/13 0512 11/14/13 1354 11/15/13 0439  NA 141 139 142  K 2.6* 3.8 3.2*  CL 96 96 98  CO2 29 29 32  GLUCOSE 98 103* 90  BUN 70* 62* 53*  CREATININE 1.36* 1.21* 1.08  CALCIUM 9.7 9.5 9.5   LFT  Recent Labs  11/12/13 0900  ALBUMIN 2.2*    Studies/Results: US Abdomen Limited  11/13/2013   CLINICAL DATA:  Ascites.  Cirrhosis.  EXAM: LIMITED ABDOMEN ULTRASOUND FOR ASCITES  TECHNIQUE: Limited ultrasound survey for ascites was performed in all four abdominal quadrants.  COMPARISON:  None.  US RENAL dated 11/11/2013; US PARACENTESIS dated 11/06/2013  FINDINGS: Small amount of ascites present.  IMPRESSION: Small amount ascites.   Electronically Signed   By: Marcello Moores  Register   On: 11/13/2013 13:03   Dg Chest Port 1 View  11/13/2013   CLINICAL DATA:  PICC line placement  EXAM: PORTABLE CHEST - 1 VIEW   COMPARISON:  10/12/2013.  FINDINGS: Right PICC line is in place. Wire appears to be in place. Tip of the PICC line is at the level of the distal superior vena cava. No gross pneumothorax.  Cardiomegaly.  Pulmonary vascular prominence most notable centrally.  Subsegmental atelectasis lung bases.  IMPRESSION: PICC line tip distal superior vena cava level.  Cardiomegaly.  Pulmonary vascular prominence most notable centrally.  This is a call report.   Electronically Signed   By: Chauncey Cruel M.D.   On: 11/13/2013 11:23    Assessment: 38 year old female with likely cirrhosis of unknown etiology, extensively evaluated during last 2 hospitalizations with negative viral markers, autoimmune work-up, celiac serologies, ceruloplasmin, and 24 hour urine copper; admitted with anasarca, acute renal failure. Clinically improving from admission and stable from a GI standpoint for discharge. Empirically treated for SBP this admission, IV abx completed. Will need daily Cipro for prophylaxis.   Acute on chronic anemia without overt signs of GI bleeding. 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 Will need outpatient colonoscopy at some point in the future.    ARF: followed by Nephrology. Improving   Concern for amyloidosis: Hematology consulted. Fat pad biopsy cancelled as patient now agreeable for liver biopsy, will need test sample for Red Congo Stain per Hematology. Liver biopsy can be set up as outpatient. Patient agreeable to this.    Plan: Cipro  500 mg daily Daily PPI Follow-up on pending biopsies 2 g Na diet Liver biopsy as outpatient next week: our office will arrange Colonoscopy as outpatient, non-urgent Will follow peripherally. Patient stable from a GI standpoint for discharge.    Orvil Feil, ANP-BC [facility]       LOS: 10 days    11/15/2013, 7:42 AM    Attending note: Patient seen and examined.  Started on Cipro daily as prophylaxis against recurrent SBP per Dr. Oneida Alar. Agree with plans for outpatient liver biopsy.

## 2013-11-15 NOTE — Progress Notes (Signed)
PT Cancellation Note  Patient Details Name: Wanda [name] MRN: [id number] DOB: [birth year]   Cancelled Treatment:    Reason Eval/Treat Not Completed: Other (comment) Pt refuses PT this morning as she has not had breakfast yet. Will attempt to see pt later to day if time allows.  Rachelle Hora, PTA  11/15/2013, 8:55 AM

## 2013-11-15 NOTE — Progress Notes (Signed)
Patient's potassium level is 3.2,Dr Short notified. Will continue to monitor patient.

## 2013-11-15 NOTE — Progress Notes (Signed)
[facility] PROGRESS NOTE  Wanda [name] VEL:[id number] DOB: 09-02-1975 DOA: 11/05/2013 PCP: No PCP Per Patient  Summary:  38 year old woman with cirrhosis of unclear etiology who presented with bilateral lower extremity edema and generalized weakness. Initial evaluation revealed decompensated cirrhosis, anemia and possible early sepsis, SBP, hepatic encephalopathy. She was treated empirically with antibiotics for SBP although no fluid studies were sent. Hepatic encephalopathy rapidly resolved. Her hospitalization and prolonged by progressive renal failure and recurrent anemia. With high-dose diuretics kidney function is now improving. Etiology of anemia is unclear but currently stable. EGD planned 4/7. Hematology also consulted, consider fat pad biopsy to rule out amyloidosis. Liver biopsy has been agreed to by the patient and will be planned in the near future. Likely home in the next few days as kidney function improves.   Assessment/Plan  Acute renal failure with anasarca.  Renal ultrasound unremarkable.  BUN and creatinine trending down with diuresis -  Appreciate nephrology assistance -  Continue diuresis with lasix 100mg  BID and metolazone until near euvolemic  Cirrhosis with anasara/massive volume overload of unclear etiology.   24 hour urinary copper minimally elevated without other positive parameters for Wilson's disease. Negative viral markers, negative autoimmune, normal celiac serologies Feb 2015.  Paracentesis Feb 2015 with 1180 ml ascitic fluid removed. No significant findings on fluid analysis, but her SAAG score was 1.6 indicating likely portal hypertension.   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 Stable.    Anemia, acute on chronic. Likely secondary to chronic disease  exacerbated by acute illness. Status post 1 unit packed red blood cells 3/29, 1 unit 3/31, 1 unit 4/5.  -  EGD demonstrated portal hypertensive gastropathy and small grade 1 varices, no obvious hemorrhage or explanation for anemia -  Appreciate hematology assistance -  Plan to add congo red stain to liver biopsy sample to eval for amyloid  Possible SBP. Completed empiric treatment followed by prophylaxis. No studies were sent.  Hepatic encephalopathy with some slow thinking.  Increase lactulose until having approximately 2 BMs per day.  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  -  Psychiatry recommended outpatient followup with the patient declined this. Continue SSRI.  Sick euthyroid, elevated TSH. Normal T4, slightly low T3.  -  Repeat TFTs in about 4 weeks once clinically improved  Hypokalemia due to diuresis.   -  Schedule potassium BID today  Hyperhomocysteinemia, stable.  Continue folic acid 1mg  daily indefinitely  Diet:  Low sodium Access:  PICC  IVF:  none Proph:  SCDs prior to procedure  Code Status: full Family Communication: patient alone Disposition Plan: pending further diuresis, fat pad biopsy, EGD   Consultants:  GI  Psychiatry Nephrology Hematology General surgery Procedures:  3/30 Large-volume paracentesis with removal of 2 L of fluid.  2-D echocardiogram: Left ventricular ejection fraction 65-70%. Normal wall motion. Diastolic dysfunction noted.  PICC line 4/6 Antibiotics:  Vanc/Zosyn 3/29-4/1  Cefotaxime 4/1 >> 4/5  HPI/Subjective:  Patient states that she feels tired and continues to have aches in legs.    Objective: Filed Vitals:   11/14/13 1639 11/14/13 2117 11/15/13 0438 11/15/13 0500  BP: 119/70 118/75 105/64   Pulse: 102 97 108   Temp: 97.4 F (36.3 C) 97.7 F (36.5 C) 97.9 F (36.6 C)   TempSrc: Oral Oral Oral   Resp: 18 20 20  Height:      Weight:   78.926 kg (174 lb) 75.751 kg (167 lb)  SpO2: 91% 95% 95%     Intake/Output  Summary (Last 24 hours) at 11/15/13 1018 Last data filed at 11/15/13 0900  Gross per 24 hour  Intake      0 ml  Output   3800 ml  Net  -3800 ml   Filed Weights   11/14/13 0528 11/15/13 0438 11/15/13 0500  Weight: 77.3 kg (170 lb 6.7 oz) 78.926 kg (174 lb) 75.751 kg (167 lb)    Exam:   General:  Causasian female, No acute distress  HEENT:  NCAT, MMM, mild scleral icterus, bronzed skin.  Telangiectasias on face/chest  Cardiovascular:  RRR, nl S1, S2 no mrg, 2+ pulses, warm extremities  Respiratory:  CTAB, no increased WOB  Abdomen:   NABS, soft, mildly distended, nontender  MSK:   Normal tone and bulk, 2+ soft pitting LEE  Neuro:  Grossly intact, somewhat slow to answer questions  Data Reviewed: Basic Metabolic Panel:  Recent Labs Lab 11/12/13 0612 11/13/13 0506 11/14/13 0512 11/14/13 1354 11/15/13 0439  NA 132* 138 141 139 142  K 3.8 2.6* 2.6* 3.8 3.2*  CL 94* 95* 96 96 98  CO2 21 23 29 29  32  GLUCOSE 107* 97 98 103* 90  BUN 90* 83* 70* 62* 53*  CREATININE 2.30* 1.83* 1.36* 1.21* 1.08  CALCIUM 9.1 9.1 9.7 9.5 9.5  MG  --   --  2.0  --   --   PHOS 6.2*  --   --   --   --    Liver Function Tests:  Recent Labs Lab 11/09/13 0609 11/10/13 1347 11/12/13 0612 11/12/13 0900  AST 173* 156* 122*  --   ALT 35 37* 33  --   ALKPHOS 167* 163* 147*  --   BILITOT 2.7* 2.4* 2.6*  --   PROT 6.8 7.2 7.3  --   ALBUMIN 2.1* 2.2* 2.2* 2.2*   No results found for this basename: LIPASE, AMYLASE,  in the last 168 hours No results found for this basename: AMMONIA,  in the last 168 hours CBC:  Recent Labs Lab 11/09/13 0609 11/11/13 0511 11/12/13 0612 11/15/13 0439  WBC 7.4 6.5 6.1 6.4  NEUTROABS  --   --   --  4.0  HGB 7.3* 7.0* 7.9* 9.3*  HCT 22.1* 21.2* 23.8* 28.0*  MCV 102.8* 103.9* 100.4* 100.4*  PLT 94* 81* 101* 219   Cardiac Enzymes: No results found for this basename: CKTOTAL, CKMB, CKMBINDEX, TROPONINI,  in the last 168 hours BNP (last 3 results) No  results found for this basename: PROBNP,  in the last 8760 hours CBG: No results found for this basename: GLUCAP,  in the last 168 hours  Recent Results (from the past 240 hour(s))  MRSA PCR SCREENING     Status: None   Collection Time    11/05/13  9:23 PM      Result Value Ref Range Status   MRSA by PCR NEGATIVE  NEGATIVE Final   Comment:            The GeneXpert MRSA Assay (FDA     approved for NASAL specimens     only), is one component of a     comprehensive MRSA colonization     surveillance program. It is not     intended to diagnose MRSA     infection nor to guide or     monitor  treatment for     MRSA infections.  CULTURE, BLOOD (ROUTINE X 2)     Status: None   Collection Time    11/06/13  8:16 AM      Result Value Ref Range Status   Specimen Description BLOOD RIGHT HAND   Final   Special Requests     Final   Value: BOTTLES DRAWN AEROBIC AND ANAEROBIC AEB=10CC ANA=8CC   Culture NO GROWTH 5 DAYS   Final   Report Status 11/11/2013 FINAL   Final  CULTURE, BLOOD (ROUTINE X 2)     Status: None   Collection Time    11/06/13  8:32 AM      Result Value Ref Range Status   Specimen Description BLOOD LEFT ANTECUBITAL   Final   Special Requests BOTTLES DRAWN AEROBIC ONLY 5CC   Final   Culture NO GROWTH 5 DAYS   Final   Report Status 11/11/2013 FINAL   Final     Studies: US Abdomen Limited  11/13/2013   CLINICAL DATA:  Ascites.  Cirrhosis.  EXAM: LIMITED ABDOMEN ULTRASOUND FOR ASCITES  TECHNIQUE: Limited ultrasound survey for ascites was performed in all four abdominal quadrants.  COMPARISON:  None.  US RENAL dated 11/11/2013; US PARACENTESIS dated 11/06/2013  FINDINGS: Small amount of ascites present.  IMPRESSION: Small amount ascites.   Electronically Signed   By: Marcello Moores  Register   On: 11/13/2013 13:03   Dg Chest Port 1 View  11/13/2013   CLINICAL DATA:  PICC line placement  EXAM: PORTABLE CHEST - 1 VIEW  COMPARISON:  10/12/2013.  FINDINGS: Right PICC line is in place. Wire  appears to be in place. Tip of the PICC line is at the level of the distal superior vena cava. No gross pneumothorax.  Cardiomegaly.  Pulmonary vascular prominence most notable centrally.  Subsegmental atelectasis lung bases.  IMPRESSION: PICC line tip distal superior vena cava level.  Cardiomegaly.  Pulmonary vascular prominence most notable centrally.  This is a call report.   Electronically Signed   By: Chauncey Cruel M.D.   On: 11/13/2013 11:23    Scheduled Meds: . ciprofloxacin  500 mg Oral QPC breakfast  . feeding supplement (ENSURE COMPLETE)  237 mL Oral BID BM  . folic acid  1 mg Oral Daily  . furosemide  100 mg Intravenous BID  . lactulose  10 g Oral Daily  . metolazone  5 mg Oral BID  . multivitamin with minerals  1 tablet Oral Daily  . pantoprazole  40 mg Oral Daily  . potassium chloride  40 mEq Oral BID  . sertraline  25 mg Oral Daily  . sodium chloride  10-40 mL Intracatheter Q12H  . sodium chloride  3 mL Intravenous Q12H  . spironolactone  50 mg Oral Daily   Continuous Infusions:   Principal Problem:   Cirrhosis Active Problems:   Jaundice   Ascites   Dehydration   Acute blood loss anemia   ARF (acute renal failure)   Transaminitis   Hepatopathy   SBP (spontaneous bacterial peritonitis)    Time spent: 30 min    [facility] Pager [phone]. If 7PM-7AM, please contact night-coverage at [email or web address], password [facility] 11/15/2013, 10:18 AM  LOS: 10 days

## 2013-11-15 NOTE — Progress Notes (Signed)
Wanda [name]  MRN: [id number]  DOB/AGE: 12-15-1975 38 y.o.  Primary Care Physician:No PCP Per Patient  Admit date: 11/05/2013  Chief Complaint:  Chief Complaint  Patient presents with  . Fatigue    S-Pt presented on  11/05/2013 with  Chief Complaint  Patient presents with  . Fatigue  .    Pt today feels better.Pt offers no new complaints.  Meds . ciprofloxacin  500 mg Oral QPC breakfast  . feeding supplement (ENSURE COMPLETE)  237 mL Oral BID BM  . folic acid  1 mg Oral Daily  . furosemide  100 mg Intravenous BID  . lactulose  10 g Oral Daily  . metolazone  5 mg Oral BID  . multivitamin with minerals  1 tablet Oral Daily  . pantoprazole  40 mg Oral Daily  . potassium chloride  40 mEq Oral BID  . sertraline  25 mg Oral Daily  . sodium chloride  10-40 mL Intracatheter Q12H  . sodium chloride  3 mL Intravenous Q12H  . spironolactone  50 mg Oral Daily     Physical Exam: Vital signs in last 24 hours: Temp:  [96.6 F (35.9 C)-98.1 F (36.7 C)] 97.9 F (36.6 C) (04/08 0438) Pulse Rate:  [91-108] 108 (04/08 0438) Resp:  [16-22] 20 (04/08 0438) BP: (103-131)/(57-75) 105/64 mmHg (04/08 0438) SpO2:  [91 %-98 %] 95 % (04/08 0438) Weight:  [167 lb (75.751 kg)-174 lb (78.926 kg)] 167 lb (75.751 kg) (04/08 0500) Weight change: 3 lb 9.4 oz (1.626 kg) Last BM Date: 11/13/13  Intake/Output from previous day: 04/07 0701 - 04/08 0700 In: 0  Out: 3800 [Urine:3800]     Physical Exam: General- pt is awake,alert, oriented to time place and person Resp- No acute REsp distress, CTA B/L NO Rhonchi CVS- S1S2 regular in rate and rhythm GIT- BS+, soft, NT, ND EXT- NO LE Edema, Cyanosis   Lab Results: CBC  Recent Labs  11/15/13 0439  WBC 6.4  HGB 9.3*  HCT 28.0*  PLT 219    BMET  Recent Labs  11/14/13 1354 11/15/13 0439  NA 139 142  K 3.8 3.2*  CL 96 98  CO2 29 32  GLUCOSE 103* 90  BUN 62* 53*  CREATININE 1.21* 1.08  CALCIUM 9.5 9.5   Trend Creat 2015   2.3==>1.21=> 1.08           3.11==>1.36==>1.18 ( Last admission)  MICRO Recent Results (from the past 240 hour(s))  MRSA PCR SCREENING     Status: None   Collection Time    11/05/13  9:23 PM      Result Value Ref Range Status   MRSA by PCR NEGATIVE  NEGATIVE Final   Comment:            The GeneXpert MRSA Assay (FDA     approved for NASAL specimens     only), is one component of a     comprehensive MRSA colonization     surveillance program. It is not     intended to diagnose MRSA     infection nor to guide or     monitor treatment for     MRSA infections.  CULTURE, BLOOD (ROUTINE X 2)     Status: None   Collection Time    11/06/13  8:16 AM      Result Value Ref Range Status   Specimen Description BLOOD RIGHT HAND   Final   Special Requests     Final   Value: BOTTLES  DRAWN AEROBIC AND ANAEROBIC AEB=10CC ANA=8CC   Culture NO GROWTH 5 DAYS   Final   Report Status 11/11/2013 FINAL   Final  CULTURE, BLOOD (ROUTINE X 2)     Status: None   Collection Time    11/06/13  8:32 AM      Result Value Ref Range Status   Specimen Description BLOOD LEFT ANTECUBITAL   Final   Special Requests BOTTLES DRAWN AEROBIC ONLY 5CC   Final   Culture NO GROWTH 5 DAYS   Final   Report Status 11/11/2013 FINAL   Final      Lab Results  Component Value Date   CALCIUM 9.5 11/15/2013   PHOS 6.2* 11/12/2013       Impression: 1)Renal  AKI secondary to Prerenal                AKI now better              2)CVS- Hemodynamically stable  3)Anemia HGb not at goal (9--11)   4)LIver- Unclear etiology of Hepatomegaly Pt has been refusing Liver biopsy,agreeable now  5)Hypokalemia Being repleted   6)GERD on PPI  7)Acid base Co2 at goal     Plan:  Will continue current care.      Wanda [name] 11/15/2013, 9:55 AM

## 2013-11-15 NOTE — Progress Notes (Unsigned)
Currently inpatient. Needs appointment for outpatient liver biopsy through Interventional Radiology in [place] later next week. Reason: cirrhosis, unknown etiology. Also needs to have liver biopsy sample tested for  [place] Stain per Hematology request.

## 2013-11-16 ENCOUNTER — Other Ambulatory Visit: Payer: Self-pay | Admitting: Gastroenterology

## 2013-11-16 DIAGNOSIS — K7460 Unspecified cirrhosis of liver: Secondary | ICD-10-CM

## 2013-11-16 LAB — BASIC METABOLIC PANEL
BUN: 35 mg/dL — ABNORMAL HIGH (ref 6–23)
CHLORIDE: 94 meq/L — AB (ref 96–112)
CO2: 36 mEq/L — ABNORMAL HIGH (ref 19–32)
Calcium: 9.6 mg/dL (ref 8.4–10.5)
Creatinine, Ser: 0.98 mg/dL (ref 0.50–1.10)
GFR calc non Af Amer: 72 mL/min — ABNORMAL LOW (ref >90)
GFR, EST AFRICAN AMERICAN: 83 mL/min — AB (ref >90)
Glucose, Bld: 133 mg/dL — ABNORMAL HIGH (ref 70–99)
Potassium: 3.5 mEq/L — ABNORMAL LOW (ref 3.7–5.3)
Sodium: 141 mEq/L (ref 137–147)

## 2013-11-16 LAB — PROTEIN ELECTROPHORESIS, SERUM
ALPHA-1-GLOBULIN: 6 % — AB (ref 2.9–4.9)
ALPHA-2-GLOBULIN: 11.3 % (ref 7.1–11.8)
Albumin ELP: 49.2 % — ABNORMAL LOW (ref 55.8–66.1)
Beta 2: 8.7 % — ABNORMAL HIGH (ref 3.2–6.5)
Beta Globulin: 5.1 % (ref 4.7–7.2)
GAMMA GLOBULIN: 19.7 % — AB (ref 11.1–18.8)
M-Spike, %: NOT DETECTED g/dL
TOTAL PROTEIN ELP: 6.6 g/dL (ref 6.0–8.3)

## 2013-11-16 LAB — METHYLMALONIC ACID, SERUM: METHYLMALONIC ACID, QUANTITATIVE: 0.49 umol/L — AB (ref <0.40)

## 2013-11-16 LAB — CBC
HEMATOCRIT: 28.8 % — AB (ref 36.0–46.0)
Hemoglobin: 9.3 g/dL — ABNORMAL LOW (ref 12.0–15.0)
MCH: 33 pg (ref 26.0–34.0)
MCHC: 32.3 g/dL (ref 30.0–36.0)
MCV: 102.1 fL — AB (ref 78.0–100.0)
Platelets: 249 10*3/uL (ref 150–400)
RBC: 2.82 MIL/uL — ABNORMAL LOW (ref 3.87–5.11)
RDW: 25 % — AB (ref 11.5–15.5)
WBC: 8.2 10*3/uL (ref 4.0–10.5)

## 2013-11-16 MED ORDER — LACTULOSE 10 GM/15ML PO SOLN: 10.0000 g | ORAL | Status: DC

## 2013-11-16 MED ORDER — TORSEMIDE 20 MG PO TABS
40.0000 mg | ORAL_TABLET | Freq: Every day | ORAL | Status: DC
  Administered 2013-11-16 – 2013-11-17 (×2): 40 mg via ORAL
  Filled 2013-11-16 (×2): qty 2

## 2013-11-16 NOTE — Progress Notes (Addendum)
[facility] PROGRESS NOTE  Wanda [name] OZD:[id number] DOB: Sep 16, 1975 DOA: 11/05/2013 PCP: No PCP Per Patient  Summary:  38 year old woman with cirrhosis of unclear etiology who presented with bilateral lower extremity edema and generalized weakness. Initial evaluation revealed decompensated cirrhosis, anemia and possible early sepsis, SBP, hepatic encephalopathy. She was treated empirically with antibiotics for SBP although no fluid studies were sent. Hepatic encephalopathy rapidly resolved. Her hospitalization and prolonged by progressive renal failure and recurrent anemia. With high-dose diuretics kidney function is now improving. Etiology of anemia is unclear but currently stable. EGD planned 4/7. Hematology also consulted, consider fat pad biopsy to rule out amyloidosis. Liver biopsy has been agreed to by the patient and will be planned in the near future. Likely home in the next few days as kidney function improves.   Assessment/Plan  Acute renal failure with anasarca.  Renal ultrasound unremarkable.  BUN and creatinine trending down with diuresis -  Appreciate nephrology assistance -  Transitioned to oral diuretics today  Cirrhosis with anasara/massive volume overload of unclear etiology.   24 hour urinary copper minimally elevated without other positive parameters for Wilson's disease. Negative viral markers, negative autoimmune, normal celiac serologies Feb 2015.  Paracentesis Feb 2015 with 1180 ml ascitic fluid removed. No significant findings on fluid analysis, but her SAAG score was 1.6 indicating likely portal hypertension.   -  Liver biopsy as outpatient -  Decrease lactulose due to diarrhea.  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 Stable.    Anemia, acute on chronic. Likely secondary to chronic disease exacerbated by acute illness.  Status post 1 unit packed red blood cells 3/29, 1 unit 3/31, 1 unit 4/5.  -  EGD demonstrated portal hypertensive gastropathy and small grade 1 varices, no obvious hemorrhage or explanation for anemia -  Appreciate hematology assistance -  Plan to add congo red stain to liver biopsy sample to eval for amyloid  Possible SBP. Completed empiric treatment followed by prophylaxis. No studies were sent.  Hepatic encephalopathy with some slow thinking.  Increase lactulose until having approximately 2 BMs per day.  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  -  Psychiatry recommended outpatient followup with the patient declined this. Continue SSRI.  Sick euthyroid, elevated TSH. Normal T4, slightly low T3.  -  Repeat TFTs in about 4 weeks once clinically improved  Hypokalemia improving -  Continue potassium BID today but may need to reduce tomorrow as diuretics are being decreased  Hyperhomocysteinemia, stable.  Continue folic acid 1mg  daily indefinitely  Generalized weakness -  D/c foley -  OOB to chair -  PT eval  Diet:  Low sodium Access:  PICC  IVF:  none Proph:  SCDs prior to procedure  Code Status: full Family Communication: patient alone Disposition Plan:  Likely will need a skilled nursing facility, however she has not been willing to work with physical therapy. Encourage her to work with physical therapy.    Consultants:  GI  Psychiatry Nephrology Hematology General surgery Procedures:  3/30 Large-volume paracentesis with removal of 2 L of fluid.  2-D echocardiogram: Left ventricular ejection fraction 65-70%. Normal wall motion. Diastolic dysfunction noted.  PICC line 4/6 Antibiotics:  Vanc/Zosyn 3/29-4/1  Cefotaxime 4/1 >> 4/5  HPI/Subjective:  Patient states that she feels tired.  Legs ache.  Denies nausea, vomiting.  Had 8 stools yesterday  Objective: Filed Vitals:   11/15/13  0500 11/15/13 1252 11/15/13 2008 11/16/13 0451  BP:  104/63 108/69 131/82  Pulse:   107 106 108  Temp:  97.7 F (36.5 C) 97.8 F (36.6 C) 97 F (36.1 C)  TempSrc:  Oral Oral Oral  Resp:  20 20 20   Height:      Weight: 75.751 kg (167 lb)   73.9 kg (162 lb 14.7 oz)  SpO2:  96% 91% 96%    Intake/Output Summary (Last 24 hours) at 11/16/13 1116 Last data filed at 11/16/13 3086  Gross per 24 hour  Intake    760 ml  Output   3475 ml  Net  -2715 ml   Filed Weights   11/15/13 0438 11/15/13 0500 11/16/13 0451  Weight: 78.926 kg (174 lb) 75.751 kg (167 lb) 73.9 kg (162 lb 14.7 oz)    Exam:   General:  Causasian female, No acute distress  HEENT:  NCAT, MMM, mild scleral icterus, bronzed skin.  Telangiectasias on face/chest  Cardiovascular:  RRR, nl S1, S2 no mrg, 2+ pulses, warm extremities  Respiratory:  CTAB, no increased WOB  Abdomen:   NABS, soft, mildly distended, nontender  MSK:   Normal tone and bulk, 2+ soft pitting LEE  Neuro:  Grossly intact, faster to answer questions today  Data Reviewed: Basic Metabolic Panel:  Recent Labs Lab 11/12/13 0612 11/13/13 0506 11/14/13 0512 11/14/13 1354 11/15/13 0439 11/16/13 0858  NA 132* 138 141 139 142 141  K 3.8 2.6* 2.6* 3.8 3.2* 3.5*  CL 94* 95* 96 96 98 94*  CO2 21 23 29 29  32 36*  GLUCOSE 107* 97 98 103* 90 133*  BUN 90* 83* 70* 62* 53* 35*  CREATININE 2.30* 1.83* 1.36* 1.21* 1.08 0.98  CALCIUM 9.1 9.1 9.7 9.5 9.5 9.6  MG  --   --  2.0  --   --   --   PHOS 6.2*  --   --   --   --   --    Liver Function Tests:  Recent Labs Lab 11/10/13 1347 11/12/13 0612 11/12/13 0900  AST 156* 122*  --   ALT 37* 33  --   ALKPHOS 163* 147*  --   BILITOT 2.4* 2.6*  --   PROT 7.2 7.3  --   ALBUMIN 2.2* 2.2* 2.2*   No results found for this basename: LIPASE, AMYLASE,  in the last 168 hours No results found for this basename: AMMONIA,  in the last 168 hours CBC:  Recent Labs Lab 11/11/13 0511 11/12/13 0612 11/15/13 0439 11/16/13 0858  WBC 6.5 6.1 6.4 8.2  NEUTROABS  --   --  4.0  --   HGB 7.0*  7.9* 9.3* 9.3*  HCT 21.2* 23.8* 28.0* 28.8*  MCV 103.9* 100.4* 100.4* 102.1*  PLT 81* 101* 219 249   Cardiac Enzymes: No results found for this basename: CKTOTAL, CKMB, CKMBINDEX, TROPONINI,  in the last 168 hours BNP (last 3 results) No results found for this basename: PROBNP,  in the last 8760 hours CBG: No results found for this basename: GLUCAP,  in the last 168 hours  No results found for this or any previous visit (from the past 240 hour(s)).   Studies: No results found.  Scheduled Meds: . ciprofloxacin  500 mg Oral QPC breakfast  . feeding supplement (ENSURE COMPLETE)  237 mL Oral BID BM  . folic acid  1 mg Oral Daily  . lactulose  10 g Oral Daily  . metolazone  5 mg Oral  BID  . multivitamin with minerals  1 tablet Oral Daily  . pantoprazole  40 mg Oral Daily  . potassium chloride  40 mEq Oral BID  . sertraline  25 mg Oral Daily  . sodium chloride  10-40 mL Intracatheter Q12H  . sodium chloride  3 mL Intravenous Q12H  . spironolactone  50 mg Oral Daily  . torsemide  40 mg Oral Daily   Continuous Infusions:   Principal Problem:   Cirrhosis Active Problems:   Jaundice   Ascites   Dehydration   Acute blood loss anemia   ARF (acute renal failure)   Transaminitis   Hepatopathy   SBP (spontaneous bacterial peritonitis)    Time spent: 30 min    [facility] Pager [phone]. If 7PM-7AM, please contact night-coverage at [email or web address], password [facility] 11/16/2013, 11:16 AM  LOS: 11 days

## 2013-11-16 NOTE — Progress Notes (Signed)
Order has been sent for Liver Biopsy to [facility]

## 2013-11-16 NOTE — Progress Notes (Signed)
Subjective: Interval History:  No new complaint. No nausea or vomiting  Objective: Vital signs in last 24 hours: Temp:  [97 F (36.1 C)-97.8 F (36.6 C)] 97 F (36.1 C) (04/09 0451) Pulse Rate:  [106-108] 108 (04/09 0451) Resp:  [20] 20 (04/09 0451) BP: (104-131)/(63-82) 131/82 mmHg (04/09 0451) SpO2:  [91 %-96 %] 96 % (04/09 0451) Weight:  [73.9 kg (162 lb 14.7 oz)] 73.9 kg (162 lb 14.7 oz) (04/09 0451) Weight change: -5.026 kg (-11 lb 1.3 oz)  Intake/Output from previous day: 04/08 0701 - 04/09 0700 In: 560 [P.O.:380; IV Piggyback:180] Out: 4098 [Urine:3475] Intake/Output this shift:    General appearance: alert, cooperative and no distress Resp: diminished breath sounds bilaterally Cardio: regular rate and rhythm, S1, S2 normal, no murmur, click, rub or gallop GI: soft, non-tender; bowel sounds normal; no masses,  no organomegaly Extremities: edema She has 3+ edema bilaterally.  Lab Results:  Recent Labs  11/15/13 0439  WBC 6.4  HGB 9.3*  HCT 28.0*  PLT 219   BMET:   Recent Labs  11/14/13 1354 11/15/13 0439  NA 139 142  K 3.8 3.2*  CL 96 98  CO2 29 32  GLUCOSE 103* 90  BUN 62* 53*  CREATININE 1.21* 1.08  CALCIUM 9.5 9.5   No results found for this basename: PTH,  in the last 72 hours Iron Studies: No results found for this basename: IRON, TIBC, TRANSFERRIN, FERRITIN,  in the last 72 hours  Studies/Results: No results found.  I have reviewed the patient'Wanda current medications.  Assessment/Plan: Problem #1 her renal failure: Her BUN is 53 and creatinine is 1.08  renal function is recovering. Patient presently does not have any nausea or vomiting. Problem #2 hypokalemia: Potassium is low and declining .  Problem #3 anasarca: Improving. Presently she is on lasix/Metolazone.  She had 3.5 liters of out put  has improved. Patient has lost about 11 kg for the last couple of days Problem #4 anemia her hemoglobin and hematocrit is stable. Anemia of chronic  disease. Problem #5 liver cirrhosis Problem #6 history of depression Problem #7 proteinuria Plan: D/C Lasix iv  Start on Demadex 40 mg po once a day Basic metabolic panel.   LOS: 11 days   Wanda [name] Wanda [name] 11/16/2013,9:01 AM

## 2013-11-16 NOTE — Progress Notes (Signed)
PT Cancellation Note  Patient Details Name: Wanda [name] MRN: [id number] DOB: Jun 24, 1976   Cancelled Treatment:    Reason Eval/Treat Not Completed: Other (comment) Pt refuses PT. She states: "I just don't feel like it today." Educated pt on the importance of exercising and getting out of bed to avoid deconditioning. Encouraged pt to complete bed exercises throughout the day to maintain strength.  Rachelle Hora, PTA  11/16/2013, 10:16 AM

## 2013-11-17 ENCOUNTER — Encounter (HOSPITAL_COMMUNITY): Payer: Self-pay | Admitting: Gastroenterology

## 2013-11-17 DIAGNOSIS — N289 Disorder of kidney and ureter, unspecified: Secondary | ICD-10-CM

## 2013-11-17 LAB — CBC
HCT: 31.1 % — ABNORMAL LOW (ref 36.0–46.0)
Hemoglobin: 9.9 g/dL — ABNORMAL LOW (ref 12.0–15.0)
MCH: 32.7 pg (ref 26.0–34.0)
MCHC: 31.8 g/dL (ref 30.0–36.0)
MCV: 102.6 fL — ABNORMAL HIGH (ref 78.0–100.0)
Platelets: 282 10*3/uL (ref 150–400)
RBC: 3.03 MIL/uL — ABNORMAL LOW (ref 3.87–5.11)
RDW: 24.2 % — AB (ref 11.5–15.5)
WBC: 9.9 10*3/uL (ref 4.0–10.5)

## 2013-11-17 LAB — BASIC METABOLIC PANEL
BUN: 28 mg/dL — AB (ref 6–23)
CO2: 40 mEq/L (ref 19–32)
CREATININE: 1.01 mg/dL (ref 0.50–1.10)
Calcium: 9.8 mg/dL (ref 8.4–10.5)
Chloride: 95 mEq/L — ABNORMAL LOW (ref 96–112)
GFR calc Af Amer: 80 mL/min — ABNORMAL LOW (ref >90)
GFR, EST NON AFRICAN AMERICAN: 69 mL/min — AB (ref >90)
Glucose, Bld: 103 mg/dL — ABNORMAL HIGH (ref 70–99)
POTASSIUM: 3.5 meq/L — AB (ref 3.7–5.3)
Sodium: 145 mEq/L (ref 137–147)

## 2013-11-17 LAB — IMMUNOFIXATION ELECTROPHORESIS
IGG (IMMUNOGLOBIN G), SERUM: 1370 mg/dL (ref 690–1700)
IGM, SERUM: 60 mg/dL (ref 52–322)
IgA: 568 mg/dL — ABNORMAL HIGH (ref 69–380)
TOTAL PROTEIN ELP: 6.8 g/dL (ref 6.0–8.3)

## 2013-11-17 LAB — ERYTHROPOIETIN: Erythropoietin: 10.8 m[IU]/mL (ref 2.6–18.5)

## 2013-11-17 LAB — BETA 2 MICROGLOBULIN, SERUM: BETA 2 MICROGLOBULIN: 13.1 mg/L — AB (ref <=2.51)

## 2013-11-17 MED ORDER — FOLIC ACID 1 MG PO TABS
1.0000 mg | ORAL_TABLET | Freq: Every day | ORAL | Status: DC
Start: 2013-11-17 — End: 2014-02-24

## 2013-11-17 MED ORDER — LACTULOSE 10 GM/15ML PO SOLN: 10.0000 g | ORAL | Status: DC

## 2013-11-17 MED ORDER — CIPROFLOXACIN HCL 500 MG PO TABS: 500.0000 mg | ORAL_TABLET | Freq: Every day | ORAL | Status: DC

## 2013-11-17 MED ORDER — ADULT MULTIVITAMIN W/MINERALS CH: 1.0000 | ORAL_TABLET | Freq: Every day | ORAL | Status: DC

## 2013-11-17 MED ORDER — PANTOPRAZOLE SODIUM 40 MG PO TBEC: 40.0000 mg | DELAYED_RELEASE_TABLET | Freq: Every day | ORAL | Status: DC

## 2013-11-17 MED ORDER — PROPRANOLOL HCL 20 MG PO TABS: 20.0000 mg | ORAL_TABLET | Freq: Two times a day (BID) | ORAL | Status: DC

## 2013-11-17 MED ORDER — PROPRANOLOL HCL 20 MG PO TABS
20.0000 mg | ORAL_TABLET | Freq: Two times a day (BID) | ORAL | Status: DC
  Administered 2013-11-17: 20 mg via ORAL
  Filled 2013-11-17: qty 1

## 2013-11-17 MED ORDER — METOLAZONE 5 MG PO TABS: 5.0000 mg | ORAL_TABLET | Freq: Every day | ORAL | Status: DC

## 2013-11-17 MED ORDER — SERTRALINE HCL 25 MG PO TABS: 25.0000 mg | ORAL_TABLET | Freq: Every day | ORAL | Status: DC

## 2013-11-17 MED ORDER — OXYCODONE HCL 5 MG PO TABS: 5.0000 mg | ORAL_TABLET | Freq: Four times a day (QID) | ORAL | Status: DC | PRN

## 2013-11-17 MED ORDER — POTASSIUM CHLORIDE CRYS ER 20 MEQ PO TBCR
40.0000 meq | EXTENDED_RELEASE_TABLET | Freq: Every day | ORAL | Status: DC
  Administered 2013-11-17: 40 meq via ORAL

## 2013-11-17 MED ORDER — SPIRONOLACTONE 50 MG PO TABS: 50.0000 mg | ORAL_TABLET | Freq: Every day | ORAL | Status: DC

## 2013-11-17 NOTE — Discharge Summary (Addendum)
Physician Discharge Summary  Wanda [name] AYO:[id number] DOB: 09/25/1975 DOA: 11/05/2013  PCP: No PCP Per Patient  Admit date: 11/05/2013 Discharge date: 11/20/2013  Recommendations for Outpatient Follow-up:  1. Dr. Lowanda Foster in 4 weeks for repeat BMP and anasarca.  Patient started on diuretics.  2. Follow up with Gastroenterology for liver biopsy which will be arranged by GI office.  Please also perform Congo Red Stain on liver biopsy at hematology request.  Will need nonurgery colonoscopy.  Patient started on PPI and cipro for SBP prophylaxis.   3. Follow up with Hematology in 2 weeks for follow up anemia and hyperhomocystenemia.  Started on daily folic acid.   4. Follow up with primary care doctor in 1 week.  Repeat BMP and weight check.  Repeat TFTs in 1 month.   5. Home health PT/OT/RN  Discharge Diagnoses:  Principal Problem:   Cirrhosis Active Problems:   Jaundice   Ascites   Dehydration   Acute blood loss anemia   ARF (acute renal failure)   Transaminitis   Hepatopathy   SBP (spontaneous bacterial peritonitis)   Discharge Condition: stable, improved  Diet recommendation: low sodium  Wt Readings from Last 3 Encounters:  11/17/13 76.6 kg (168 lb 14 oz)  11/17/13 76.6 kg (168 lb 14 oz)  10/12/13 78.019 kg (172 lb)    History of present illness:  Wanda [name] is a 38 y.o. female who has been ion and out of the ED for similar complaints of swelling of her legs. This time she presents with weakness. She states that she is not able to stay standing for prolonged periods of time. She states that she has also noted that her legs have been more swollen of late. She states this has been going on for several months now. She notes that there is an increase in the girth of her abdomen. She has been taking torsemide for the past 3 weeks or so. When questioned about who she is getting these medications from she did not reall yknow. In the ED she has labs done showing elevation of her  liver enzymes and also an ammonia level of 77. She has also been found to have a slight elevation of her creatinine from baseline and has a drop in her hemoglobin 7.1 from 8.7 on October 13 2013. On questioning she denies drinking alcohol. She states that she has never been told she might have liver disease. She states taht she has not been using any drugs and denies current smoking. She has no CP no palpitations noted.   Hospital Course:   Cirrhosis with anasarca and massive volume overload. Unclear etiology of cirrhosis. 24 hour urinary copper level was minimally elevated without other positive parameters for Wilson's disease. Viral markers were negative. I do immune workup was negative. She had normal celiac serologies in February 2015. She underwent paracentesis in February with removal of approximately 1.2 L of ascites. There is no significant abnormality of fluid however her S. AHG scorer was 1.6, suggestive of portal hypertension. She underwent EGD which demonstrated moderate portal hypertensive gastropathy and grade 1 varix. There was concern that she may have SBP, and she was started on antibiotics. She should continue ciprofloxacin indefinitely for SBP prophylaxis.  She will be discharged on diuretics, propranolol, and  Lactulose.  She will followup in approximately one week for a liver biopsy. This will be arranged by the gastroenterology office.  Acute renal failure with anasarca. Her renal ultrasound was unremarkable. Her BUN and creatinine trended down  with diuresis.  She was diureses to with Lasix 100 mg twice a day and metolazone. Nephrology was consulted.  She will be discharged on oral diuretics. She will need a repeat BMP done in approximately one week to make sure her electrolytes are stable. She will followup with nephrology.  Anemia, acute on chronic. This may have been due to chronic disease and exacerbated by acute illness. She underwent EGD to evaluate for upper GI bleed because she  had received multiple blood transfusions (total 3 units) during this admission. There were no obvious areas of hemorrhage on EGD, however she had moderate portal hypertensive gastropathy and grade 1 varix. Hematology was consulted. Iron studies were consistent with anemia of chronic disease. Her thyroid function tests were minimally abnormal, and should be repeated as an outpatient. She had some mild elevation of kappa and lambda light chains however her ratio is normal, decreasing the likelihood of amyloidosis or multiple myeloma. Her hemoglobin has been stable for several days, and she should followup with hematology in approximately one to 2 weeks for repeat CBC.  Thrombocytopenia, likely secondary to cirrhosis.  Platelets stable.  Erosive gastritis, continue PPI.  Hepatic encephalopathy, improved with increasing doses of lactulose. Currently she is only tolerating approximately 10 g every other day. Higher doses resulted in very frequent diarrhea. She may need to consider rifaximin. Defer to gastroenterology as outpatient.  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 Psychiatry recommended outpatient followup however patient declined. She continued to SSRI.  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  Hypokalemia, but we secondary to diuresis, resolving. She will continue some potassium supplementation.  Sick euthyroid with elevated TSH and normal T4. She should have repeat TFTs in approximately 4 weeks after she is clinically improved to ensure that this has normalized.  Generalized weakness. She worked with physical therapy who recommended equipment and home health services. These will be arranged.  Consultants:  GI  Psychiatry  Nephrology  Hematology  General surgery Procedures:  3/30 Large-volume paracentesis with removal of 2 L of fluid.  2-D echocardiogram: Left ventricular ejection fraction 65-70%. Normal wall motion. Diastolic dysfunction noted.  PICC line  4/6 Antibiotics:  Vanc/Zosyn 3/29-4/1  Cefotaxime 4/1 >> 4/5   Discharge Exam: Filed Vitals:   11/17/13 0458  BP: 120/75  Pulse: 112  Temp: 98.4 F (36.9 C)  Resp: 20   Filed Vitals:   11/16/13 0451 11/16/13 1306 11/16/13 2116 11/17/13 0458  BP: 131/82 117/70 113/72 120/75  Pulse: 108 112 117 112  Temp: 97 F (36.1 C) 97.4 F (36.3 C) 97.9 F (36.6 C) 98.4 F (36.9 C)  TempSrc: Oral Oral Oral Oral  Resp: 20 20 20 20   Height:      Weight: 73.9 kg (162 lb 14.7 oz)   76.6 kg (168 lb 14 oz)  SpO2: 96% 97% 98% 96%    General: Causasian female, No acute distress  HEENT: NCAT, MMM, mild scleral icterus, bronzed skin. Telangiectasias on face/chest  Cardiovascular: RRR, nl S1, S2 no mrg, 2+ pulses, warm extremities  Respiratory: CTAB, no increased WOB  Abdomen: NABS, soft, mildly distended, nontender  MSK: Normal tone and bulk, 2+ soft pitting LEE  Neuro: Grossly intact, faster to answer questions today   Discharge Instructions      Discharge Orders   Future Appointments Provider Department Dept Phone   12/05/2013 8:30 AM Westly Pam [facility] [phone]   Future Orders Complete By Expires   Call MD for:  difficulty breathing, headache or  visual disturbances  As directed    Call MD for:  extreme fatigue  As directed    Call MD for:  hives  As directed    Call MD for:  persistant dizziness or light-headedness  As directed    Call MD for:  persistant nausea and vomiting  As directed    Call MD for:  severe uncontrolled pain  As directed    Call MD for:  temperature >100.4  As directed    Diet - low sodium heart healthy  As directed    Discharge instructions  As directed    Driving Restrictions  As directed    Increase activity slowly  As directed        Medication List         ciprofloxacin 500 MG tablet  Commonly known as:  CIPRO  Take 1 tablet (500 mg total) by mouth daily after breakfast.     folic acid 1 MG tablet   Commonly known as:  FOLVITE  Take 1 tablet (1 mg total) by mouth daily.     lactulose 10 GM/15ML solution  Commonly known as:  CHRONULAC  Take 15 mLs (10 g total) by mouth every other day.     metolazone 5 MG tablet  Commonly known as:  ZAROXOLYN  Take 1 tablet (5 mg total) by mouth daily.     multivitamin with minerals Tabs tablet  Take 1 tablet by mouth daily.     oxyCODONE 5 MG immediate release tablet  Commonly known as:  Oxy IR/ROXICODONE  Take 1 tablet (5 mg total) by mouth every 6 (six) hours as needed for severe pain (hold if BP less than 85).     pantoprazole 40 MG tablet  Commonly known as:  PROTONIX  Take 1 tablet (40 mg total) by mouth daily.     potassium chloride SA 20 MEQ tablet  Commonly known as:  K-DUR,KLOR-CON  Take 1.5 tablets (30 mEq total) by mouth daily. DO NOT TAKE THIS MEDICATION IF YOU MISS YOUR TORSEMIDE DOSE.     propranolol 20 MG tablet  Commonly known as:  INDERAL  Take 1 tablet (20 mg total) by mouth 2 (two) times daily.     sertraline 25 MG tablet  Commonly known as:  ZOLOFT  Take 1 tablet (25 mg total) by mouth daily.     spironolactone 50 MG tablet  Commonly known as:  ALDACTONE  Take 1 tablet (50 mg total) by mouth daily.     torsemide 10 MG tablet  Commonly known as:  DEMADEX  Take 50 mg by mouth daily. To decrease swelling       Follow-up Information   Follow up with [facility], MD In 4 weeks.   Specialty:  Nephrology   Contact information:   [address] [place] [address] [phone]       Follow up with Manus Rudd, MD In 1 week. (for biopsy)    Specialty:  Gastroenterology   Contact information:   [place] [address] [place] [address] [phone]       Follow up with Doroteo Bradford, MD. Schedule an appointment as soon as possible for a visit in 2 weeks.   Specialty:  Hematology and Oncology   Contact information:   [place]  [address] [phone]       Follow up with Primary care doctor In 1 month.      Follow up  On 11/23/2013. (at 3:00)    Contact information:   [facility] [phone]      Follow up with [facility].   Contact information:   [address] [place] [address] [phone]       The results of significant diagnostics from this hospitalization (including imaging, microbiology, ancillary and laboratory) are listed below for reference.    Significant Diagnostic Studies: Ct Head Wo Contrast  11/05/2013   CLINICAL DATA:  Weakness.  Difficulty walking.  EXAM: CT HEAD WITHOUT CONTRAST  TECHNIQUE: Contiguous axial images were obtained from the base of the skull through the vertex without intravenous contrast.  COMPARISON:  None.  FINDINGS: No acute intracranial abnormality. Specifically, no hemorrhage, hydrocephalus, mass lesion, acute infarction, or significant intracranial injury. No acute calvarial abnormality. Visualized paranasal sinuses and mastoids clear. Orbital soft tissues unremarkable.  IMPRESSION: No acute intracranial abnormality.   Electronically Signed   By: Rolm Baptise M.D.   On: 11/05/2013 15:17   US Renal  11/11/2013   CLINICAL DATA:  Acute renal failure.  EXAM: RENAL/URINARY TRACT ULTRASOUND COMPLETE  COMPARISON:  US PARACENTESIS dated 11/06/2013; US ABDOMEN COMPLETE dated 09/12/2013; CT ABD - PELV W/ CM dated 09/11/2013  FINDINGS: Right Kidney:  Length: 11.2 cm. Echogenicity within normal limits. No mass or hydronephrosis visualized.  Left Kidney:  Length: 12.5 cm. Echogenicity within normal limits. No mass or hydronephrosis visualized.  Bladder:  Decompressed by a Foley catheter.  Pelvic ascites noted.  Other: Markedly increased hepatic echogenicity is again noted corresponding with steatosis on CT.  IMPRESSION: 1. Both kidneys remain normal in size and without hydronephrosis. 2. Hepatic steatosis and ascites again noted.   Electronically Signed   By: Camie Patience M.D.   On: 11/11/2013 11:05   US Abdomen Limited  11/13/2013   CLINICAL DATA:  Ascites.  Cirrhosis.  EXAM: LIMITED ABDOMEN ULTRASOUND FOR ASCITES  TECHNIQUE: Limited ultrasound survey for ascites was performed in all four abdominal quadrants.  COMPARISON:  None.  US RENAL dated 11/11/2013; US PARACENTESIS dated 11/06/2013  FINDINGS: Small amount of ascites present.  IMPRESSION: Small amount ascites.   Electronically Signed   By: Marcello Moores  Register   On: 11/13/2013 13:03   US Paracentesis  11/06/2013   CLINICAL DATA:  Ascites.  EXAM: ULTRASOUND GUIDED THERAPEUTIC PARACENTESIS  COMPARISON:  US PARACENTESIS dated 09/12/2013; US ABDOMEN COMPLETE dated 09/12/2013; [place] ART/VEN FLOW ABD PELV DOPPLER LTD dated 09/12/2013  PROCEDURE: An ultrasound guided paracentesis was thoroughly discussed with the patient and questions answered. The benefits, risks, alternatives and complications were also discussed. The patient understands and wishes to proceed with the procedure. Written consent was obtained.  Ultrasound was performed to localize and mark an adequate pocket of fluid in the right lower quadrant of the abdomen. The area was then prepped and draped in the normal sterile fashion. 1% Lidocaine was used for local anesthesia. Under ultrasound guidance a 5 [place] Yueh catheter was introduced. Paracentesis was performed. The catheter was removed and a dressing applied.  Complications: None.  FINDINGS: A total of approximately 2000 ml of pinkish thin  fluid was removed.  IMPRESSION: Successful ultrasound guided paracentesis yielding 2000 mL of ascites.   Electronically Signed   By: Lovey Newcomer M.D.   On: 11/06/2013 14:08   Dg Chest Port 1 View  11/13/2013   CLINICAL DATA:  PICC line placement  EXAM: PORTABLE CHEST - 1 VIEW  COMPARISON:  10/12/2013.  FINDINGS: Right PICC line is in place.  Wire appears to be in place. Tip of the PICC line is at the level of the distal superior vena cava. No gross pneumothorax.  Cardiomegaly.   Pulmonary vascular prominence most notable centrally.  Subsegmental atelectasis lung bases.  IMPRESSION: PICC line tip distal superior vena cava level.  Cardiomegaly.  Pulmonary vascular prominence most notable centrally.  This is a call report.   Electronically Signed   By: Chauncey Cruel M.D.   On: 11/13/2013 11:23    Microbiology: No results found for this or any previous visit (from the past 240 hour(s)).   Labs: Basic Metabolic Panel:  Recent Labs Lab 11/14/13 0512 11/14/13 1354 11/15/13 0439 11/16/13 0858 11/17/13 0512  NA 141 139 142 141 145  K 2.6* 3.8 3.2* 3.5* 3.5*  CL 96 96 98 94* 95*  CO2 29 29 32 36* 40*  GLUCOSE 98 103* 90 133* 103*  BUN 70* 62* 53* 35* 28*  CREATININE 1.36* 1.21* 1.08 0.98 1.01  CALCIUM 9.7 9.5 9.5 9.6 9.8  MG 2.0  --   --   --   --    Liver Function Tests: No results found for this basename: AST, ALT, ALKPHOS, BILITOT, PROT, ALBUMIN,  in the last 168 hours No results found for this basename: LIPASE, AMYLASE,  in the last 168 hours No results found for this basename: AMMONIA,  in the last 168 hours CBC:  Recent Labs Lab 11/15/13 0439 11/16/13 0858 11/17/13 0512  WBC 6.4 8.2 9.9  NEUTROABS 4.0  --   --   HGB 9.3* 9.3* 9.9*  HCT 28.0* 28.8* 31.1*  MCV 100.4* 102.1* 102.6*  PLT 219 249 282   Cardiac Enzymes: No results found for this basename: CKTOTAL, CKMB, CKMBINDEX, TROPONINI,  in the last 168 hours BNP: BNP (last 3 results) No results found for this basename: PROBNP,  in the last 8760 hours CBG: No results found for this basename: GLUCAP,  in the last 168 hours  Time coordinating discharge: 45 minutes  Signed:  Janece Canterbury  [facility] 11/20/2013, 12:55 PM

## 2013-11-17 NOTE — Progress Notes (Signed)
Subjective: Interval History:  No new complaint. No nausea or vomiting  Objective: Vital signs in last 24 hours: Temp:  [97.4 F (36.3 C)-98.4 F (36.9 C)] 98.4 F (36.9 C) (04/10 0458) Pulse Rate:  [112-117] 112 (04/10 0458) Resp:  [20] 20 (04/10 0458) BP: (113-120)/(70-75) 120/75 mmHg (04/10 0458) SpO2:  [96 %-98 %] 96 % (04/10 0458) Weight:  [76.6 kg (168 lb 14 oz)] 76.6 kg (168 lb 14 oz) (04/10 0458) Weight change: 2.7 kg (5 lb 15.2 oz)  Intake/Output from previous day: 04/09 0701 - 04/10 0700 In: 1130 [P.O.:1130] Out: 2301 [Urine:2300; Stool:1] Intake/Output this shift:    Generally she is alert in no apparent distress. Chest decreased breath sound. Patient doesn't have any rales rhonchi or egophony. She doesn't have also any wheezing. Heart exam regular rate and rhythm no murmur no history Abdomen full, none tender and positive bowel sound. Extremities she has 1-2+ edema.  Lab Results:  Recent Labs  11/16/13 0858 11/17/13 0512  WBC 8.2 9.9  HGB 9.3* 9.9*  HCT 28.8* 31.1*  PLT 249 282   BMET:   Recent Labs  11/16/13 0858 11/17/13 0512  NA 141 145  K 3.5* 3.5*  CL 94* 95*  CO2 36* 40*  GLUCOSE 133* 103*  BUN 35* 28*  CREATININE 0.98 1.01  CALCIUM 9.6 9.8   No results found for this basename: PTH,  in the last 72 hours Iron Studies: No results found for this basename: IRON, TIBC, TRANSFERRIN, FERRITIN,  in the last 72 hours  Studies/Results: No results found.  I have reviewed the patient's current medications.  Assessment/Plan: Problem #1 her renal failure: Her BUN is 28 and creatinine is 1.01  renal function is stable. Patient presently does not have any nausea or vomiting. Problem #2 hypokalemia: Potassium has corected  Problem #3 anasarca: Improving. Presently she is on Demadex/Metolazone.  She has 2300 cc of urine out put Problem #4 anemia her hemoglobin and hematocrit is stable. Anemia of chronic disease. Problem #5 liver cirrhosis Problem  #6 history of depression Problem #7 proteinuria Plan: We will continue with present treatment We will follow patient as out patient when she is discharged Basic metabolic panel.   LOS: 12 days   Delorse Shane S Livie Vanderhoof 11/17/2013,8:23 AM

## 2013-11-17 NOTE — Progress Notes (Signed)
NUTRITION FOLLOW UP  Intervention:   Continue with current plan of care  Nutrition Dx:   Inadequate oral intake related to decreased appetite as evidenced by minimal PO intake; progressing  Goal:   Inadequate oral intake related to decreased appetite as evidenced by miimal PO intake; progressing  Monitor:   PO intake, skin assessments, labs, weight changes, I/O's  Assessment:   Intake and appetite remains stable, however, variable; meal intake ranges from 10-100%. Pt also receives Ensure Complete po BID, each supplement provides 350 kcal and 13 grams of protein.  Pt is agreeable to liver biopsy as an outpatient to further determine cause of liver disease and this will arranged by [facility] gastroenterology. Additionally, biopsy will be tested for possible amyloidosis and pt may need a fat pad biopsy for further testing. Pt s/p EGD on 11/14/13 which revealed erosive gastritis. Wt hx reveals a 12# (6.7%) wt loss x 4 days, which is significant, however, likely due to to fluid loss. Pt with edema and was recently put on lasix for diuresis.  Discharge disposition is potentially SNF, however, pt has refused working with PT.   Height: Ht Readings from Last 1 Encounters:  11/05/13 5\' 2"  (1.575 m)    Weight Status:   Wt Readings from Last 1 Encounters:  11/17/13 168 lb 14 oz (76.6 kg)    Re-estimated needs:  Kcal: 2235-2608 Protein: 77-92 grams daily Fluid: 2.6-2.6 L daily  Skin: Intact  Diet Order: Sodium Restricted   Intake/Output Summary (Last 24 hours) at 11/17/13 1541 Last data filed at 11/17/13 0950  Gross per 24 hour  Intake    590 ml  Output   1751 ml  Net  -1161 ml    Last BM: 11/17/2013   Labs:   Recent Labs Lab 11/12/13 0612  11/14/13 0512  11/15/13 0439 11/16/13 0858 11/17/13 0512  NA 132*  < > 141  < > 142 141 145  K 3.8  < > 2.6*  < > 3.2* 3.5* 3.5*  CL 94*  < > 96  < > 98 94* 95*  CO2 21  < > 29  < > 32 36* 40*  BUN 90*  < > 70*  < > 53* 35* 28*   CREATININE 2.30*  < > 1.36*  < > 1.08 0.98 1.01  CALCIUM 9.1  < > 9.7  < > 9.5 9.6 9.8  MG  --   --  2.0  --   --   --   --   PHOS 6.2*  --   --   --   --   --   --   GLUCOSE 107*  < > 98  < > 90 133* 103*  < > = values in this interval not displayed.  CBG (last 3)  No results found for this basename: GLUCAP,  in the last 72 hours  Scheduled Meds: . ciprofloxacin  500 mg Oral QPC breakfast  . feeding supplement (ENSURE COMPLETE)  237 mL Oral BID BM  . folic acid  1 mg Oral Daily  . [START ON 11/18/2013] lactulose  10 g Oral QODAY  . metolazone  5 mg Oral BID  . multivitamin with minerals  1 tablet Oral Daily  . pantoprazole  40 mg Oral Daily  . potassium chloride  40 mEq Oral Daily  . propranolol  20 mg Oral BID  . sertraline  25 mg Oral Daily  . sodium chloride  10-40 mL Intracatheter Q12H  . sodium chloride  3 mL Intravenous Q12H  . spironolactone  50 mg Oral Daily  . torsemide  40 mg Oral Daily    Continuous Infusions:   Jaquil Todt A. Jimmye Norman, RD, LDN Pager: [phone]

## 2013-11-17 NOTE — Progress Notes (Signed)
Pt discharged home today per Dr. Sheran Fava. Pt's IV site D/C'd and WNL. Pt's VSS. Pt provided with home medication list, discharge instructions and prescriptions. Verbalized understanding. Pt also made aware of need for making follow up appointments on Monday, as MD's offices were closed already. Verbalized understanding. Pt left floor via WC in stable condition accompanied by NT.

## 2013-11-17 NOTE — Care Management Utilization Note (Signed)
UR completed 

## 2013-11-17 NOTE — Progress Notes (Signed)
Physical Therapy Treatment Patient Details Name: Wanda [name] MRN: [id number] DOB: Apr 23, 1976 Today's Date: Dec 16, 2013       PT Comments    This is a late entry.  Therapist entered info yesterday but is not available.  Pt was able come supine to sit I don socks.  Able to ambulate with rolling walker and stand at sink to wash hands I.  Pt current discharge of HH is appropriate.    Follow Up Recommendations  Home health PT;Outpatient PT     Equipment Recommendations  None recommended by PT       Precautions / Restrictions Precautions Precautions: None Restrictions Weight Bearing Restrictions: No    Mobility  Bed Mobility Overal bed mobility: Modified Independent         Transfers Overall transfer level: Modified independent Equipment used: Rolling walker (2 wheeled) Transfers: Sit to/from Stand Sit to Stand: Modified independent (Device/Increase time)      Ambulation/Gait Ambulation/Gait assistance: Modified independent (Device/Increase time) Ambulation Distance (Feet): 200 Feet Assistive device: Rolling walker (2 wheeled)     Gait velocity interpretation: Below normal speed for age/gender                       Cognition Arousal/Alertness: Awake/alert Behavior During Therapy: WFL for tasks assessed/performed Overall Cognitive Status: Within Functional Limits for tasks assessed             Exercises General Exercises - Lower Extremity Ankle Circles/Pumps: 10 reps Quad Sets: 10 reps Long Arc Quad: 10 reps Heel Slides: 10 reps Straight Leg Raises: 10 reps        Pertinent Vitals/Pain None verbalized            PT Goals (current goals can now be found in the care plan section) Acute Rehab PT Goals PT Goal Formulation: With patient Time For Goal Achievement: 11/23/13 Potential to Achieve Goals: Good Progress towards PT goals: Progressing toward goals    Frequency  Min 3X/week    PT Plan Current plan remains appropriate        End of Session Equipment Utilized During Treatment: Gait belt Activity Tolerance: Patient tolerated treatment well Patient left: in chair;with call bell/phone within reach;with chair alarm set     Time: 1550-1600 PT Time Calculation (min): 10 min  Charges:  $Gait Training: 8-22 mins $Therapeutic Exercise: 8-22 mins                    G Codes:      Leeroy Cha 16-Dec-2013, 1:07 PM

## 2013-11-21 ENCOUNTER — Other Ambulatory Visit: Payer: Self-pay | Admitting: Radiology

## 2013-11-23 ENCOUNTER — Ambulatory Visit (HOSPITAL_COMMUNITY)
Admission: RE | Admit: 2013-11-23 | Discharge: 2013-11-23 | Disposition: A | Discharge status: Home / Self Care | Payer: Medicare Other | Source: Ambulatory Visit | Attending: Gastroenterology | Admitting: Gastroenterology

## 2013-11-23 VITALS — BP 111/66 | HR 115 | Temp 98.5°F | Resp 20 | Ht 62.0 in | Wt 160.0 lb

## 2013-11-23 DIAGNOSIS — R52 Pain, unspecified: Secondary | ICD-10-CM

## 2013-11-23 DIAGNOSIS — K7460 Unspecified cirrhosis of liver: Secondary | ICD-10-CM | POA: Insufficient documentation

## 2013-11-23 DIAGNOSIS — K7689 Other specified diseases of liver: Secondary | ICD-10-CM | POA: Insufficient documentation

## 2013-11-23 LAB — PROTIME-INR
INR: 1.19 (ref 0.00–1.49)
Prothrombin Time: 14.8 seconds (ref 11.6–15.2)

## 2013-11-23 LAB — CBC
HEMATOCRIT: 34.5 % — AB (ref 36.0–46.0)
HEMOGLOBIN: 11.7 g/dL — AB (ref 12.0–15.0)
MCH: 33.2 pg (ref 26.0–34.0)
MCHC: 33.9 g/dL (ref 30.0–36.0)
MCV: 98 fL (ref 78.0–100.0)
Platelets: 218 10*3/uL (ref 150–400)
RBC: 3.52 MIL/uL — AB (ref 3.87–5.11)
RDW: 21.5 % — ABNORMAL HIGH (ref 11.5–15.5)
WBC: 15.9 10*3/uL — AB (ref 4.0–10.5)

## 2013-11-23 LAB — APTT: APTT: 34 s (ref 24–37)

## 2013-11-23 MED ORDER — MIDAZOLAM HCL 2 MG/2ML IJ SOLN
INTRAMUSCULAR | Status: AC | PRN
  Administered 2013-11-23: 1 mg via INTRAVENOUS
  Administered 2013-11-23: 2 mg via INTRAVENOUS
  Administered 2013-11-23: 1 mg via INTRAVENOUS

## 2013-11-23 MED ORDER — FENTANYL CITRATE 0.05 MG/ML IJ SOLN
INTRAMUSCULAR | Status: AC | PRN
  Administered 2013-11-23 (×2): 50 ug via INTRAVENOUS

## 2013-11-23 MED ORDER — HYDROCODONE-ACETAMINOPHEN 5-325 MG PO TABS
1.0000 | ORAL_TABLET | ORAL | Status: DC | PRN
  Filled 2013-11-23: qty 2

## 2013-11-23 MED ORDER — FENTANYL CITRATE 0.05 MG/ML IJ SOLN
INTRAMUSCULAR | Status: AC
  Filled 2013-11-23: qty 2

## 2013-11-23 MED ORDER — SODIUM CHLORIDE 0.9 % IV SOLN
INTRAVENOUS | Status: DC
  Administered 2013-11-23: 10:00:00 via INTRAVENOUS

## 2013-11-23 MED ORDER — MIDAZOLAM HCL 2 MG/2ML IJ SOLN
INTRAMUSCULAR | Status: AC
  Filled 2013-11-23: qty 4

## 2013-11-23 NOTE — Discharge Instructions (Signed)
Liver Biopsy  Care After  These instructions give you information on caring for yourself after your procedure. Your doctor may also give you more specific instructions. Call your doctor if you have any problems or questions after your procedure.  HOME CARE  · Watch for bleeding at your biopsy site.  · No heavy lifting, pushing, or pulling for 48 hours (2 days).  · No exercise, jogging, or sex for 48 hours (2 days).  · Do not drive or use heavy machinery for 24 hours (1 day).  · Go back to your usual diet and medicines as told by your doctor.  · Do not take the bandage off until the next morning.  · Only take medicine as told by your doctor.  · Do not shower or bathe until the next day.  GET HELP RIGHT AWAY IF:  · You have shortness of breath or trouble breathing.  · You have pain or cramping in your belly (abdomen).  · You feel sick to your stomach (nauseous) or throw up (vomit).  · Bleeding does not stop from the place where the needle was put in. Press on the place that is bleeding until you are checked in the Emergency Room.  · Yellowish white fluid (pus) is coming from the place where the needle was put in.  · You have any unusual pain that will not stop.  · You have puffiness (swelling) or redness at the place where the needle was put in, or if the place is very sore or hot when you touch it.  · You have a fever of more than 102° F (38.9° C) for 2 or more days.  · You have black, smelly poops (bowel movements).  If you go to the Emergency Room, tell the nurse that you had a liver biopsy. Take this paper with you and show it to the nurse. Keep your follow-up appointment.  MAKE SURE YOU:  · Understand these instructions.  · Will watch your condition.  · Will get help right away if you are not doing well or get worse.  Document Released: 05/05/2008 Document Revised: 10/19/2011 Document Reviewed: 05/05/2008  Exit[employer]® Patient Information ©2014 [employer].

## 2013-11-23 NOTE — Procedures (Signed)
US liver core 18g x3 to surg path No complication No blood loss. See complete dictation in [facility] PACS.  

## 2013-11-23 NOTE — H&P (Signed)
Wanda [name] is an 38 y.o. female.   Chief Complaint: "I'm having a liver biopsy" HPI: Patient with history of elevated LFT's, ascites,  moderate portal hypertensive gastropathy with grade 1 esophageal varices, anemia and findings suggestive of cirrhosis vs fatty liver on recent imaging presents today for US guided random core liver biopsy with additional Congo red staining/possible paracentesis.   Past Medical History  Diagnosis Date  . GERD (gastroesophageal reflux disease)   . Liver disease 10/05/13    Unknwn etiology. Pt refused biopsy  . Acute renal failure 09/2013    Pre-renal- resolved  . Folate deficiency 09/2013  . Anasarca 10/10/2013    Past Surgical History  Procedure Laterality Date  . None    . Paracentesis  Feb 2015    1180 fluid, negative fluid analysis.   [name] Esophagogastroduodenoscopy N/A 11/14/2013    Procedure: ESOPHAGOGASTRODUODENOSCOPY (EGD);  Surgeon: Danie Binder, MD;  Location: [facility];  Service: Endoscopy;  Laterality: N/A;    Family History  Problem Relation Age of Onset  . Heart disease Mother   . Colon cancer Neg Hx   . Liver disease Neg Hx    Social History:  reports that she quit smoking about 5 years ago. Her smoking use included Cigarettes. She has a 5 pack-year smoking history. She has never used smokeless tobacco. She reports that she does not drink alcohol or use illicit drugs.  Allergies: No Known Allergies  Current outpatient prescriptions:ciprofloxacin (CIPRO) 500 MG tablet, Take 1 tablet (500 mg total) by mouth daily after breakfast., Disp: 30 tablet, Rfl: 0;  folic acid (FOLVITE) 1 MG tablet, Take 1 tablet (1 mg total) by mouth daily., Disp: 30 tablet, Rfl: 0;  metolazone (ZAROXOLYN) 5 MG tablet, Take 1 tablet (5 mg total) by mouth daily., Disp: 30 tablet, Rfl: 0 Multiple Vitamin (MULTIVITAMIN WITH MINERALS) TABS tablet, Take 1 tablet by mouth daily., Disp: 30 tablet, Rfl: 0;  oxyCODONE (OXY IR/ROXICODONE) 5 MG immediate release tablet, Take  1 tablet (5 mg total) by mouth every 6 (six) hours as needed for severe pain (hold if BP less than 85)., Disp: 10 tablet, Rfl: 0;  pantoprazole (PROTONIX) 40 MG tablet, Take 1 tablet (40 mg total) by mouth daily., Disp: 30 tablet, Rfl: 0 potassium chloride SA (K-DUR,KLOR-CON) 20 MEQ tablet, Take 1.5 tablets (30 mEq total) by mouth daily. DO NOT TAKE THIS MEDICATION IF YOU MISS YOUR TORSEMIDE DOSE., Disp: 45 tablet, Rfl: 0;  sertraline (ZOLOFT) 25 MG tablet, Take 1 tablet (25 mg total) by mouth daily., Disp: 30 tablet, Rfl: 0;  spironolactone (ALDACTONE) 100 MG tablet, Take 100 mg by mouth daily with breakfast., Disp: , Rfl:  torsemide (DEMADEX) 10 MG tablet, Take 50 mg by mouth daily. To decrease swelling, Disp: , Rfl: ;  lactulose (CHRONULAC) 10 GM/15ML solution, Take 15 mLs (10 g total) by mouth every other day., Disp: 960 mL, Rfl: 0;  propranolol (INDERAL) 20 MG tablet, Take 1 tablet (20 mg total) by mouth 2 (two) times daily., Disp: 60 tablet, Rfl: 0 Current facility-administered medications:0.9 %  sodium chloride infusion, , Intravenous, Continuous, Ascencion Dike, PA-C, Last Rate: 20 mL/hr at 11/23/13 5409   Results for orders placed during the hospital encounter of 11/23/13 (from the past 48 hour(s))  CBC     Status: Abnormal   Collection Time    11/23/13  9:32 AM      Result Value Ref Range   WBC 15.9 (*) 4.0 - 10.5 K/uL   RBC 3.52 (*)  3.87 - 5.11 MIL/uL   Hemoglobin 11.7 (*) 12.0 - 15.0 g/dL   HCT 34.5 (*) 36.0 - 46.0 %   MCV 98.0  78.0 - 100.0 fL   MCH 33.2  26.0 - 34.0 pg   MCHC 33.9  30.0 - 36.0 g/dL   RDW 21.5 (*) 11.5 - 15.5 %   Platelets 218  150 - 400 K/uL   Results for orders placed during the hospital encounter of 11/23/13  CBC      Result Value Ref Range   WBC 15.9 (*) 4.0 - 10.5 K/uL   RBC 3.52 (*) 3.87 - 5.11 MIL/uL   Hemoglobin 11.7 (*) 12.0 - 15.0 g/dL   HCT 34.5 (*) 36.0 - 46.0 %   MCV 98.0  78.0 - 100.0 fL   MCH 33.2  26.0 - 34.0 pg   MCHC 33.9  30.0 - 36.0 g/dL    RDW 21.5 (*) 11.5 - 15.5 %   Platelets 218  150 - 400 K/uL  PROTIME-INR      Result Value Ref Range   Prothrombin Time 14.8  11.6 - 15.2 seconds   INR 1.19  0.00 - 1.49    Review of Systems  Constitutional: Negative for fever and chills.  Respiratory: Negative for hemoptysis and shortness of breath.        Occ cough  Cardiovascular: Negative for chest pain.  Gastrointestinal: Positive for abdominal pain. Negative for nausea, vomiting and blood in stool.  Genitourinary: Negative for hematuria.  Musculoskeletal:       Occ back pain  Skin: Positive for rash.       Maculopapular rash both UE's- has been present for few weeks  Neurological: Positive for weakness. Negative for headaches.  Psychiatric/Behavioral: Positive for depression. The patient is nervous/anxious.     Blood pressure 127/78, pulse 114, temperature 98.4 F (36.9 C), resp. rate 20, height 5\' 2"  (1.575 m), weight 160 lb (72.576 kg), last menstrual period 09/12/2013, SpO2 20.00%. Physical Exam  Constitutional: She is oriented to person, place, and time.  Sl disheveled appearing WF in NAD  Eyes: Scleral icterus is present.  Cardiovascular:  Tachy but reg rhythm  Respiratory: Effort normal.  Few scattered crackles throughout both fields; no wheezing  GI: Soft. Bowel sounds are normal.  Mild epigastric /lower pelvic tenderness  Musculoskeletal: Normal range of motion.  Trace LE edema  Neurological: She is alert and oriented to person, place, and time.     Assessment/Plan Patient with history of elevated LFT's, ascites,  moderate portal hypertensive gastropathy with grade 1 esophageal varices, anemia and findings suggestive of cirrhosis vs fatty liver on recent imaging presents today for US guided random core liver biopsy with additional Congo red staining/possible paracentesis. Details/risks of procedure d/w pt/boyfriend with their understanding and consent. Due to recent hx of pneumonia and current leukocytosis  will check f/u CXR.   Wanda [name] 11/23/2013, 10:10 AM

## 2013-12-04 ENCOUNTER — Telehealth: Payer: Self-pay | Admitting: Gastroenterology

## 2013-12-04 DIAGNOSIS — F319 Bipolar disorder, unspecified: Secondary | ICD-10-CM

## 2013-12-04 DIAGNOSIS — K7460 Unspecified cirrhosis of liver: Secondary | ICD-10-CM

## 2013-12-04 HISTORY — DX: Bipolar disorder, unspecified: F31.9

## 2013-12-04 NOTE — Telephone Encounter (Signed)
Please call pt. HER stomach Bx shows GASTROPATHY, WHICH IS A CHANGE IN THE LINING OF HER STOMACH DUE TO CIRRHOSIS. HER LIVER BIOPSY SHOWS CIRRHOSIS DUE TO STEATOHEPATITIS, WHICH COMES FROM CARRYING TO MUCH WEIGHT IN THE PAST AND FAT IN HER LIVER CAUSES SCARRING AS IF SHE DRANK ETOH. SHE SHOULD SCHEDULE A TCS(MOVIPREP) IN MAY 2015 TO COMPLETE HER EVALUATION FOR LOW BLOOD COUNT AND COLITIS. OPV E30 JUN 2015 DX: CIRRHOSIS.

## 2013-12-04 NOTE — Telephone Encounter (Signed)
Pt has OV with [facility] tomorrow 4/28 at 0830 and I have also made OV with SF for 6/3 at 2.

## 2013-12-05 ENCOUNTER — Other Ambulatory Visit: Payer: Self-pay | Admitting: Gastroenterology

## 2013-12-05 ENCOUNTER — Encounter: Payer: Self-pay | Admitting: Gastroenterology

## 2013-12-05 ENCOUNTER — Ambulatory Visit (INDEPENDENT_AMBULATORY_CARE_PROVIDER_SITE_OTHER): Payer: Medicare Other | Admitting: Gastroenterology

## 2013-12-05 VITALS — BP 143/92 | HR 128 | Temp 97.2°F | Ht 62.0 in | Wt 134.6 lb

## 2013-12-05 DIAGNOSIS — K7460 Unspecified cirrhosis of liver: Secondary | ICD-10-CM

## 2013-12-05 DIAGNOSIS — D649 Anemia, unspecified: Secondary | ICD-10-CM

## 2013-12-05 DIAGNOSIS — K529 Noninfective gastroenteritis and colitis, unspecified: Secondary | ICD-10-CM

## 2013-12-05 DIAGNOSIS — K5289 Other specified noninfective gastroenteritis and colitis: Secondary | ICD-10-CM

## 2013-12-05 DIAGNOSIS — I498 Other specified cardiac arrhythmias: Secondary | ICD-10-CM

## 2013-12-05 DIAGNOSIS — R000 Tachycardia, unspecified: Secondary | ICD-10-CM

## 2013-12-05 NOTE — Progress Notes (Signed)
Primary Care Physician: No PCP Per Patient  Primary Gastroenterologist:  Barney Drain, MD   Chief Complaint  Patient presents with  . Follow-up    HPI: Wanda [name] is a 38 y.o. female here for f/u cirrhosis. Recent hospitalization from 11/05/13 to 11/17/13. Finally had her liver biopsy on 11/23/2013. Biopsy result consistent with steatohepatitis/cirrhosis. Congo red stain was negative for amyloidosis. Minimally elevated 24 hour urinary copper.  Patient presents today to schedule colonoscopy not only for history of anemia but previous CT suggested colitis. History of diarrhea during hospitalizations with negative GI pathogen panel. Taking lactulose, several BMs daily. No melena, brbpr. Fatigue improved. No melena, brbpr. No abdominal pain. No heartburn. Some epigastric pressure when lay on back. Poor appetite. Denies any lower extremity edema. She canceled her physical therapy/home health. States it was not useful. Stays in bed most of the time, ambulates to the kitchen at times. Uses bedside commode.  Has concerns about persistent tachycardia. Noted throughout her hospitalization and currently today. Agree with patient, will get a cardiology evaluation prior to sending her for liver transplant center consultation.  Patient continues to decline history of significant alcohol use. Denies any recent alcohol use.   Current Outpatient Prescriptions  Medication Sig Dispense Refill  . ciprofloxacin (CIPRO) 500 MG tablet Take 1 tablet (500 mg total) by mouth daily after breakfast.  30 tablet  0  . folic acid (FOLVITE) 1 MG tablet Take 1 tablet (1 mg total) by mouth daily.  30 tablet  0  . lactulose (CHRONULAC) 10 GM/15ML solution Take 15 mLs (10 g total) by mouth every other day.  960 mL  0  . metolazone (ZAROXOLYN) 5 MG tablet Take 1 tablet (5 mg total) by mouth daily.  30 tablet  0  . Multiple Vitamin (MULTIVITAMIN WITH MINERALS) TABS tablet Take 1 tablet by mouth daily.  30 tablet  0    . oxyCODONE (OXY IR/ROXICODONE) 5 MG immediate release tablet Take 1 tablet (5 mg total) by mouth every 6 (six) hours as needed for severe pain (hold if BP less than 85).  10 tablet  0  . pantoprazole (PROTONIX) 40 MG tablet Take 1 tablet (40 mg total) by mouth daily.  30 tablet  0  . potassium chloride SA (K-DUR,KLOR-CON) 20 MEQ tablet Take 1.5 tablets (30 mEq total) by mouth daily. DO NOT TAKE THIS MEDICATION IF YOU MISS YOUR TORSEMIDE DOSE.  45 tablet  0  . propranolol (INDERAL) 20 MG tablet Take 1 tablet (20 mg total) by mouth 2 (two) times daily.  60 tablet  0  . sertraline (ZOLOFT) 25 MG tablet Take 1 tablet (25 mg total) by mouth daily.  30 tablet  0  . spironolactone (ALDACTONE) 100 MG tablet Take 100 mg by mouth daily with breakfast.      . torsemide (DEMADEX) 10 MG tablet Take 50 mg by mouth daily. To decrease swelling       No current facility-administered medications for this visit.    Allergies as of 12/05/2013  . (No Known Allergies)   Past Medical History  Diagnosis Date  . GERD (gastroesophageal reflux disease)   . Liver disease 10/05/13    Liver bx 11/23/13 (delayed initially due to patient refusal). c/w steatohepatitis  . Acute renal failure 09/2013    Pre-renal- resolved  . Folate deficiency 09/2013  . Anasarca 10/10/2013   Past Surgical History  Procedure Laterality Date  . None    . Paracentesis  Feb 2015  1180 fluid, negative fluid analysis.   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 Paracentesis  10/2013   Family History  Problem Relation Age of Onset  . Heart disease Mother   . Colon cancer Neg Hx   . Liver disease Neg Hx    History   Social History  . Marital Status: Single    Spouse Name: N/A    Number of Children: 1  . Years of Education: N/A   Occupational History  . unemployed    Social History Main Topics   . Smoking status: Former Smoker -- 0.25 packs/day for 20 years    Types: Cigarettes    Quit date: 11/05/2008  . Smokeless tobacco: Never Used  . Alcohol Use: No  . Drug Use: No  . Sexual Activity: Yes    Partners: Male    Birth Control/ Protection: None   Other Topics Concern  . None   Social History Narrative  . None    ROS:  General: See history of present illness. Complains of muscle wasting ENT: Negative for hoarseness, difficulty swallowing , nasal congestion. CV: Negative for chest pain, angina, palpitations, dyspnea on exertion, peripheral edema.  Respiratory: Negative for dyspnea at rest, dyspnea on exertion, cough, sputum, wheezing.  GI: See history of present illness. GU:  Negative for dysuria, hematuria, urinary incontinence, urinary frequency, nocturnal urination.  Endo: Negative for unusual weight change.    Physical Examination:   BP 143/92  Pulse 128  Temp(Src) 97.2 F (36.2 C) (Oral)  Ht 5\' 2"  (1.575 m)  Wt 134 lb 9.6 oz (61.054 kg)  BMI 24.61 kg/m2  LMP 09/12/2013  General: Chronically ill-appearing female in no acute distress. Accompanied by boyfriend. Eyes: Slight icterus  Mouth: Oropharyngeal mucosa moist and pink , no lesions erythema or exudate. Lungs: Clear to auscultation bilaterally.  Heart: Regular rate and rhythm, no murmurs rubs or gallops.  Abdomen: Bowel sounds are normal, nontender, nondistended, no hepatosplenomegaly or masses, no abdominal bruits or hernia , no rebound or guarding.   Extremities: No lower extremity edema. No clubbing or deformities. Neuro: Alert and oriented x 4. No asterixis. Skin: Warm and dry, no jaundice.   Psych: Alert and cooperative, normal mood and affect.  Labs:  Lab Results  Component Value Date   WBC 15.9* 11/23/2013   HGB 11.7* 11/23/2013   HCT 34.5* 11/23/2013   MCV 98.0 11/23/2013   PLT 218 11/23/2013   Lab Results  Component Value Date   INR 1.19 11/23/2013   INR 1.20 11/10/2013   INR 1.40  11/06/2013   Lab Results  Component Value Date   CREATININE 1.01 11/17/2013   BUN 28* 11/17/2013   NA 145 11/17/2013   K 3.5* 11/17/2013   CL 95* 11/17/2013   CO2 40* 11/17/2013   Lab Results  Component Value Date   ALT 33 11/12/2013   AST 122* 11/12/2013   ALKPHOS 147* 11/12/2013   BILITOT 2.6* 11/12/2013    Imaging Studies:   US Abdomen Limited  11/13/2013   CLINICAL DATA:  Ascites.  Cirrhosis.  EXAM: LIMITED ABDOMEN ULTRASOUND FOR ASCITES  TECHNIQUE: Limited ultrasound survey for ascites was performed in all four abdominal quadrants.  COMPARISON:  None.  US RENAL dated 11/11/2013; US PARACENTESIS dated 11/06/2013  FINDINGS: Small amount of ascites present.  IMPRESSION: Small amount ascites.   Electronically Signed   By: Marcello Moores  Register  On: 11/13/2013 13:03   US Biopsy  11/23/2013   CLINICAL DATA:  Cirrhosis  EXAM: ULTRASOUND-GUIDED CORE LIVER BIOPSY  TECHNIQUE: An ultrasound guided liver biopsy was thoroughly discussed with the patient and questions were answered. The benefits, risks, alternatives, and complications were also discussed. The patient understands and wishes to proceed with the procedure. A verbal as well as written consent was obtained.  Survey ultrasound of the liver was performed. No significant abdominal ascites identified. An appropriate skin entry site was determined. Skin site was marked, prepped with Betadine, and draped in usual sterile fashion, and infiltrated locally with 1% lidocaine.  Intravenous Fentanyl and Versed were administered as conscious sedation during continuous cardiorespiratory monitoring by the radiology RN, with a total moderate sedation time of six minutes.  A 17 gauge trocar needle was advanced under ultrasound guidance into the liver. 4 coaxial 18gauge core samples were then obtained through the guide needle. The guide needle was removed. Post procedure scans demonstrate no apparent complication.  FINDINGS: No significant abdominal ascites. No focal liver  lesion identified. Ultrasound-guided random core liver biopsy obtained.  IMPRESSION: 1. Technically successful ultrasound guided core liver biopsy.   Electronically Signed   By: Arne Cleveland M.D.   On: 11/23/2013 15:42   US Paracentesis  11/06/2013   CLINICAL DATA:  Ascites.  EXAM: ULTRASOUND GUIDED THERAPEUTIC PARACENTESIS  COMPARISON:  US PARACENTESIS dated 09/12/2013; US ABDOMEN COMPLETE dated 09/12/2013; [place] ART/VEN FLOW ABD PELV DOPPLER LTD dated 09/12/2013  PROCEDURE: An ultrasound guided paracentesis was thoroughly discussed with the patient and questions answered. The benefits, risks, alternatives and complications were also discussed. The patient understands and wishes to proceed with the procedure. Written consent was obtained.  Ultrasound was performed to localize and mark an adequate pocket of fluid in the right lower quadrant of the abdomen. The area was then prepped and draped in the normal sterile fashion. 1% Lidocaine was used for local anesthesia. Under ultrasound guidance a 5 [place] Yueh catheter was introduced. Paracentesis was performed. The catheter was removed and a dressing applied.  Complications: None.  FINDINGS: A total of approximately 2000 ml of pinkish thin  fluid was removed.  IMPRESSION: Successful ultrasound guided paracentesis yielding 2000 mL of ascites.   Electronically Signed   By: Lovey Newcomer M.D.   On: 11/06/2013 14:08

## 2013-12-05 NOTE — Assessment & Plan Note (Signed)
Cardiology evaluation at this time. Interestingly, patient is on propanolol for esophageal varices but continues to have tachycardia.

## 2013-12-05 NOTE — Assessment & Plan Note (Signed)
Liver biopsy consistent with steatohepatitis/cirrhosis. Patient continues to deny significant alcohol use in the past or current use. She has a followup with Dr. Oneida Alar in about a month for cirrhosis care. Discussed briefly with patient today, she will likely be referred to liver transplant center for evaluation. At this time she has no evidence of anasarca. Encouraged to increase oral intake, may add Ensure 2-3 times a day if needed.

## 2013-12-05 NOTE — Telephone Encounter (Signed)
TCS is scheduled w/[facility] on May 12th and I gave her instructions at her office visit

## 2013-12-05 NOTE — Assessment & Plan Note (Signed)
History of diarrhea, anemia. CT with possible colitis versus findings related to hypoalbuminemia. Colonoscopy has been recommended. Pursue in the near future.  I have discussed the risks, alternatives, benefits with regards to but not limited to the risk of reaction to medication, bleeding, infection, perforation and the patient is agreeable to proceed. Written consent to be obtained.

## 2013-12-05 NOTE — Assessment & Plan Note (Signed)
Evaluate at time of colonoscopy. Known gastropathy related to cirrhosis. Has required transfusion.

## 2013-12-05 NOTE — Patient Instructions (Signed)
1. Keep upcoming appointment on June 3 at 2 PM with Dr. Oneida Alar. 2. Colonoscopy as scheduled. Please see separate instructions. 3. Cardiology consultation for persistent tachycardia.

## 2013-12-06 ENCOUNTER — Encounter (HOSPITAL_COMMUNITY): Payer: Self-pay | Admitting: Pharmacy Technician

## 2013-12-06 NOTE — Progress Notes (Signed)
No PCP 

## 2013-12-19 ENCOUNTER — Ambulatory Visit (HOSPITAL_COMMUNITY)
Admission: RE | Admit: 2013-12-19 | Discharge: 2013-12-19 | Disposition: A | Discharge status: Home / Self Care | Payer: Medicare Other | Source: Ambulatory Visit | Attending: Gastroenterology | Admitting: Gastroenterology

## 2013-12-19 ENCOUNTER — Encounter (HOSPITAL_COMMUNITY): Payer: Self-pay | Admitting: *Deleted

## 2013-12-19 ENCOUNTER — Encounter (HOSPITAL_COMMUNITY)
Admission: RE | Disposition: A | Discharge status: Home / Self Care | Payer: Self-pay | Source: Ambulatory Visit | Attending: Gastroenterology

## 2013-12-19 DIAGNOSIS — Z79899 Other long term (current) drug therapy: Secondary | ICD-10-CM | POA: Insufficient documentation

## 2013-12-19 DIAGNOSIS — K648 Other hemorrhoids: Secondary | ICD-10-CM | POA: Insufficient documentation

## 2013-12-19 DIAGNOSIS — K529 Noninfective gastroenteritis and colitis, unspecified: Secondary | ICD-10-CM

## 2013-12-19 DIAGNOSIS — Z87891 Personal history of nicotine dependence: Secondary | ICD-10-CM | POA: Insufficient documentation

## 2013-12-19 DIAGNOSIS — R609 Edema, unspecified: Secondary | ICD-10-CM | POA: Insufficient documentation

## 2013-12-19 DIAGNOSIS — K7460 Unspecified cirrhosis of liver: Secondary | ICD-10-CM

## 2013-12-19 DIAGNOSIS — D529 Folate deficiency anemia, unspecified: Secondary | ICD-10-CM | POA: Insufficient documentation

## 2013-12-19 DIAGNOSIS — D649 Anemia, unspecified: Secondary | ICD-10-CM | POA: Insufficient documentation

## 2013-12-19 DIAGNOSIS — K769 Liver disease, unspecified: Secondary | ICD-10-CM | POA: Insufficient documentation

## 2013-12-19 DIAGNOSIS — R000 Tachycardia, unspecified: Secondary | ICD-10-CM

## 2013-12-19 DIAGNOSIS — D126 Benign neoplasm of colon, unspecified: Secondary | ICD-10-CM | POA: Insufficient documentation

## 2013-12-19 HISTORY — PX: COLONOSCOPY: SHX5424

## 2013-12-19 LAB — COMPREHENSIVE METABOLIC PANEL
ALBUMIN: 3.5 g/dL (ref 3.5–5.2)
ALT: 33 U/L (ref 0–35)
AST: 60 U/L — ABNORMAL HIGH (ref 0–37)
Alkaline Phosphatase: 116 U/L (ref 39–117)
BUN: 16 mg/dL (ref 6–23)
CALCIUM: 9.4 mg/dL (ref 8.4–10.5)
CO2: 18 mEq/L — ABNORMAL LOW (ref 19–32)
Chloride: 102 mEq/L (ref 96–112)
Creatinine, Ser: 1.21 mg/dL — ABNORMAL HIGH (ref 0.50–1.10)
GFR calc Af Amer: 64 mL/min — ABNORMAL LOW (ref >90)
GFR, EST NON AFRICAN AMERICAN: 56 mL/min — AB (ref >90)
Glucose, Bld: 100 mg/dL — ABNORMAL HIGH (ref 70–99)
Potassium: 3.8 mEq/L (ref 3.7–5.3)
SODIUM: 140 meq/L (ref 137–147)
TOTAL PROTEIN: 7.5 g/dL (ref 6.0–8.3)
Total Bilirubin: 1 mg/dL (ref 0.3–1.2)

## 2013-12-19 LAB — HCG, SERUM, QUALITATIVE: Preg, Serum: NEGATIVE

## 2013-12-19 LAB — CBC
HEMATOCRIT: 33.8 % — AB (ref 36.0–46.0)
HEMOGLOBIN: 11.8 g/dL — AB (ref 12.0–15.0)
MCH: 32.3 pg (ref 26.0–34.0)
MCHC: 34.9 g/dL (ref 30.0–36.0)
MCV: 92.6 fL (ref 78.0–100.0)
Platelets: 207 10*3/uL (ref 150–400)
RBC: 3.65 MIL/uL — AB (ref 3.87–5.11)
RDW: 17.3 % — ABNORMAL HIGH (ref 11.5–15.5)
WBC: 9.1 10*3/uL (ref 4.0–10.5)

## 2013-12-19 SURGERY — COLONOSCOPY
Anesthesia: Moderate Sedation

## 2013-12-19 MED ORDER — MEPERIDINE HCL 100 MG/ML IJ SOLN
INTRAMUSCULAR | Status: AC
  Filled 2013-12-19: qty 2

## 2013-12-19 MED ORDER — PANTOPRAZOLE SODIUM 40 MG PO TBEC: DELAYED_RELEASE_TABLET | ORAL | Status: DC

## 2013-12-19 MED ORDER — MIDAZOLAM HCL 5 MG/5ML IJ SOLN
INTRAMUSCULAR | Status: AC
  Filled 2013-12-19: qty 10

## 2013-12-19 MED ORDER — METOLAZONE 5 MG PO TABS: 5.0000 mg | ORAL_TABLET | Freq: Every day | ORAL | Status: DC

## 2013-12-19 MED ORDER — MIDAZOLAM HCL 5 MG/5ML IJ SOLN
INTRAMUSCULAR | Status: DC | PRN
  Administered 2013-12-19 (×5): 2 mg via INTRAVENOUS

## 2013-12-19 MED ORDER — PROMETHAZINE HCL 25 MG/ML IJ SOLN
INTRAMUSCULAR | Status: DC | PRN
Start: 2013-12-19 — End: 2013-12-19
  Administered 2013-12-19: 12.5 mg via INTRAVENOUS

## 2013-12-19 MED ORDER — STERILE WATER FOR IRRIGATION IR SOLN
Status: DC | PRN
  Administered 2013-12-19: 10:00:00

## 2013-12-19 MED ORDER — SODIUM CHLORIDE 0.9 % IV SOLN: INTRAVENOUS | Status: DC

## 2013-12-19 MED ORDER — MEPERIDINE HCL 100 MG/ML IJ SOLN
INTRAMUSCULAR | Status: DC | PRN
  Administered 2013-12-19 (×5): 25 mg via INTRAVENOUS

## 2013-12-19 MED ORDER — PROMETHAZINE HCL 25 MG/ML IJ SOLN
INTRAMUSCULAR | Status: AC
  Filled 2013-12-19: qty 1

## 2013-12-19 NOTE — Progress Notes (Signed)
REVIEWED.  

## 2013-12-19 NOTE — Discharge Instructions (Signed)
You had 1 polyps removed FROM YOUR COLON. You have internal hemorrhoids.   CONTINUE PROTONIX AND METOLAZONE.  I WILL CHECK YOU KIDNEY, LIVER, AND BLOOD COUNT TODAY.  FOLLOW A HIGH FIBER DIET. AVOID ITEMS THAT CAUSE BLOATING & GAS. SEE INFO BELOW.  YOUR BIOPSY RESULTS SHOULD BE BACK IN 7 DAYS.  Next colonoscopy in 10 years.    Colonoscopy Care After Read the instructions outlined below and refer to this sheet in the next week. These discharge instructions provide you with general information on caring for yourself after you leave the hospital. While your treatment has been planned according to the most current medical practices available, unavoidable complications occasionally occur. If you have any problems or questions after discharge, call DR. Elyce Zollinger, [phone].  ACTIVITY  You may resume your regular activity, but move at a slower pace for the next 24 hours.   Take frequent rest periods for the next 24 hours.   Walking will help get rid of the air and reduce the bloated feeling in your belly (abdomen).   No driving for 24 hours (because of the medicine (anesthesia) used during the test).   You may shower.   Do not sign any important legal documents or operate any machinery for 24 hours (because of the anesthesia used during the test).    NUTRITION  Drink plenty of fluids.   You may resume your normal diet as instructed by your doctor.   Begin with a light meal and progress to your normal diet. Heavy or fried foods are harder to digest and may make you feel sick to your stomach (nauseated).   Avoid alcoholic beverages for 24 hours or as instructed.    MEDICATIONS  You may resume your normal medications.   WHAT YOU CAN EXPECT TODAY  Some feelings of bloating in the abdomen.   Passage of more gas than usual.   Spotting of blood in your stool or on the toilet paper  .  IF YOU HAD POLYPS REMOVED DURING THE COLONOSCOPY:  Eat a soft diet IF YOU HAVE NAUSEA,  BLOATING, ABDOMINAL PAIN, OR VOMITING.    FINDING OUT THE RESULTS OF YOUR TEST Not all test results are available during your visit. DR. Oneida Alar WILL CALL YOU WITHIN 7 DAYS OF YOUR PROCEDUE WITH YOUR RESULTS. Do not assume everything is normal if you have not heard from DR. Shondrika Hoque IN ONE WEEK, CALL HER OFFICE AT [phone].  SEEK IMMEDIATE MEDICAL ATTENTION AND CALL THE OFFICE: [phone] IF:  You have more than a spotting of blood in your stool.   Your belly is swollen (abdominal distention).   You are nauseated or vomiting.   You have a temperature over 101F.   You have abdominal pain or discomfort that is severe or gets worse throughout the day.   High-Fiber Diet A high-fiber diet changes your normal diet to include more whole grains, legumes, fruits, and vegetables. Changes in the diet involve replacing refined carbohydrates with unrefined foods. The calorie level of the diet is essentially unchanged. The Dietary Reference Intake (recommended amount) for adult males is 38 grams per day. For adult females, it is 25 grams per day. Pregnant and lactating women should consume 28 grams of fiber per day. Fiber is the intact part of a plant that is not broken down during digestion. Functional fiber is fiber that has been isolated from the plant to provide a beneficial effect in the body. PURPOSE  Increase stool bulk.   Ease and regulate bowel movements.  Lower cholesterol.  INDICATIONS THAT YOU NEED MORE FIBER  Constipation and hemorrhoids.   Uncomplicated diverticulosis (intestine condition) and irritable bowel syndrome.   Weight management.   As a protective measure against hardening of the arteries (atherosclerosis), diabetes, and cancer.   GUIDELINES FOR INCREASING FIBER IN THE DIET  Start adding fiber to the diet slowly. A gradual increase of about 5 more grams (2 slices of whole-wheat bread, 2 servings of most fruits or vegetables, or 1 bowl of high-fiber cereal) per  day is best. Too rapid an increase in fiber may result in constipation, flatulence, and bloating.   Drink enough water and fluids to keep your urine clear or pale yellow. Water, juice, or caffeine-free drinks are recommended. Not drinking enough fluid may cause constipation.   Eat a variety of high-fiber foods rather than one type of fiber.   Try to increase your intake of fiber through using high-fiber foods rather than fiber pills or supplements that contain small amounts of fiber.   The goal is to change the types of food eaten. Do not supplement your present diet with high-fiber foods, but replace foods in your present diet.  INCLUDE A VARIETY OF FIBER SOURCES  Replace refined and processed grains with whole grains, canned fruits with fresh fruits, and incorporate other fiber sources. White rice, white breads, and most bakery goods contain little or no fiber.   Brown whole-grain rice, buckwheat oats, and many fruits and vegetables are all good sources of fiber. These include: broccoli, Brussels sprouts, cabbage, cauliflower, beets, sweet potatoes, white potatoes (skin on), carrots, tomatoes, eggplant, squash, berries, fresh fruits, and dried fruits.   Cereals appear to be the richest source of fiber. Cereal fiber is found in whole grains and bran. Bran is the fiber-rich outer coat of cereal grain, which is largely removed in refining. In whole-grain cereals, the bran remains. In breakfast cereals, the largest amount of fiber is found in those with "bran" in their names. The fiber content is sometimes indicated on the label.   You may need to include additional fruits and vegetables each day.   In baking, for 1 cup white flour, you may use the following substitutions:   1 cup whole-wheat flour minus 2 tablespoons.   1/2 cup white flour plus 1/2 cup whole-wheat flour.   Polyps, Colon  A polyp is extra tissue that grows inside your body. Colon polyps grow in the large intestine. The large  intestine, also called the colon, is part of your digestive system. It is a long, hollow tube at the end of your digestive tract where your body makes and stores stool. Most polyps are not dangerous. They are benign. This means they are not cancerous. But over time, some types of polyps can turn into cancer. Polyps that are smaller than a pea are usually not harmful. But larger polyps could someday become or may already be cancerous. To be safe, doctors remove all polyps and test them.   WHO GETS POLYPS? Anyone can get polyps, but certain people are more likely than others. You may have a greater chance of getting polyps if:  You are over 50.   You have had polyps before.   Someone in your family has had polyps.   Someone in your family has had cancer of the large intestine.   Find out if someone in your family has had polyps. You may also be more likely to get polyps if you:   Eat a lot of fatty foods  Smoke   Drink alcohol   Do not exercise  Eat too much   TREATMENT  The caregiver will remove the polyp during sigmoidoscopy or colonoscopy.    PREVENTION There is not one sure way to prevent polyps. You might be able to lower your risk of getting them if you:  Eat more fruits and vegetables and less fatty food.   Do not smoke.   Avoid alcohol.   Exercise every day.   Lose weight if you are overweight.   Eating more calcium and folate can also lower your risk of getting polyps. Some foods that are rich in calcium are milk, cheese, and broccoli. Some foods that are rich in folate are chickpeas, kidney beans, and spinach.   Hemorrhoids Hemorrhoids are dilated (enlarged) veins around the rectum. Sometimes clots will form in the veins. This makes them swollen and painful. These are called thrombosed hemorrhoids. Causes of hemorrhoids include:  Constipation.   Straining to have a bowel movement.   HEAVY LIFTING HOME CARE INSTRUCTIONS  Eat a well balanced diet and drink  6 to 8 glasses of water every day to avoid constipation. You may also use a bulk laxative.   Avoid straining to have bowel movements.   Keep anal area dry and clean.   Do not use a donut shaped pillow or sit on the toilet for long periods. This increases blood pooling and pain.   Move your bowels when your body has the urge; this will require less straining and will decrease pain and pressure.

## 2013-12-19 NOTE — Op Note (Signed)
[facility] [address] [place], [address]   COLONOSCOPY PROCEDURE REPORT  PATIENT: Wanda [name], Wanda [name]  MR#: [id number] BIRTHDATE: Jul 20, 1976 , 37  yrs. old GENDER: Female ENDOSCOPIST: Barney Drain, MD REFERRED BY:   Estrella Myrtle, MD PROCEDURE DATE:  12/19/2013 PROCEDURE:   Colonoscopy with snare polypectomy INDICATIONS:Anemia, non-specific.  PT OUT OF DEMADEX AND KCL FOR PAST 3 WEEKS. NO BRBPR OR MELENA. CBC APR 2015: HB 11.7, PLT 213k, INR: 1.42 MEDICATIONS: Demerol 125 mg IV, Versed 10 mg IV, and Promethazine (Phenergan) 12.5mg  IV  DESCRIPTION OF PROCEDURE:    Physical exam was performed.  Informed consent was obtained from the patient after explaining the benefits, risks, and alternatives to procedure.  The patient was connected to monitor and placed in left lateral position. Continuous oxygen was provided by nasal cannula and IV medicine administered through an indwelling cannula.  After administration of sedation and rectal exam, the patients rectum was intubated and the EC-[id number] ([id number])  colonoscope was advanced under direct visualization to the ileum.  The scope was removed slowly by carefully examining the color, texture, anatomy, and integrity mucosa on the way out.  The patient was recovered in endoscopy and discharged home in satisfactory condition.    COLON FINDINGS: The mucosa appeared normal in the terminal ileum.  , A sessile polyp measuring 6 mm in size was found in the sigmoid colon.  A polypectomy was performed using snare cautery.  , The colon mucosa was otherwise normal.  , and Small internal hemorrhoids were found.  PREP QUALITY: good.  CECAL W/D TIME: 14 minutes     COMPLICATIONS: None  ENDOSCOPIC IMPRESSION: 1.   NO OBVIOUS SOURCE FOR ANEMIA IDETIFIED. 2.   ONE COLON POLYP REMOVED 3.   Small internal hemorrhoids  RECOMMENDATIONS: CONTINUE PROTONIX AND METOLAZONE. CHECK CMP AND CBC TODAY. HOLD KCL, ALDACTONE, AND  DEMADEX. FOLLOW A HIGH FIBER DIET.  AVOID ITEMS THAT CAUSE BLOATING & GAS. BIOPSY RESULTS SHOULD BE BACK IN 7 DAYS.  Next colonoscopy in 10 years.       _______________________________ Lorrin MaisBarney Drain, MD 12/19/2013 12:07 PM

## 2013-12-19 NOTE — H&P (Signed)
Primary Care Physician:  No PCP Per Patient Primary Gastroenterologist:  Dr. Oneida Alar  Pre-Procedure History & Physical: HPI:  Wanda [name] is a 38 y.o. female here for Anemia.  Past Medical History  Diagnosis Date  . GERD (gastroesophageal reflux disease)   . Liver disease 10/05/13    Liver bx 11/23/13 (delayed initially due to patient refusal). c/w steatohepatitis  . Acute renal failure 09/2013    Pre-renal- resolved  . Folate deficiency 09/2013  . Anasarca 10/10/2013    Past Surgical History  Procedure Laterality Date  . None    . Paracentesis  Feb 2015    1180 fluid, negative fluid analysis.   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 Paracentesis  10/2013    Prior to Admission medications   Medication Sig Start Date End Date Taking? Authorizing Provider  folic acid (FOLVITE) 1 MG tablet Take 1 tablet (1 mg total) by mouth daily. 11/17/13  Yes Janece Canterbury, MD  lactulose (CHRONULAC) 10 GM/15ML solution Take 15 mLs (10 g total) by mouth every other day. 11/18/13  Yes Janece Canterbury, MD  metolazone (ZAROXOLYN) 5 MG tablet Take 1 tablet (5 mg total) by mouth daily. 11/17/13  Yes Janece Canterbury, MD  Multiple Vitamin (MULTIVITAMIN WITH MINERALS) TABS tablet Take 1 tablet by mouth daily. 11/17/13  Yes Janece Canterbury, MD  pantoprazole (PROTONIX) 40 MG tablet Take 40 mg by mouth daily as needed (Acid Reflux). 11/17/13  Yes Janece Canterbury, MD  potassium chloride SA (K-DUR,KLOR-CON) 20 MEQ tablet Take 1.5 tablets (30 mEq total) by mouth daily. DO NOT TAKE THIS MEDICATION IF YOU MISS YOUR TORSEMIDE DOSE. 10/13/13  Yes Rexene Alberts, MD  sertraline (ZOLOFT) 25 MG tablet Take 1 tablet (25 mg total) by mouth daily. 11/17/13  Yes Janece Canterbury, MD  spironolactone (ALDACTONE) 100 MG tablet Take 100 mg by mouth daily with breakfast.   Yes Historical Provider, MD   torsemide (DEMADEX) 10 MG tablet Take 10 mg by mouth daily.    Yes Historical Provider, MD    Allergies as of 12/05/2013  . (No Known Allergies)    Family History  Problem Relation Age of Onset  . Heart disease Mother   . Colon cancer Neg Hx   . Liver disease Neg Hx     History   Social History  . Marital Status: Single    Spouse Name: N/A    Number of Children: 1  . Years of Education: N/A   Occupational History  . unemployed    Social History Main Topics  . Smoking status: Former Smoker -- 0.25 packs/day for 20 years    Types: Cigarettes    Quit date: 11/05/2008  . Smokeless tobacco: Never Used  . Alcohol Use: No  . Drug Use: No  . Sexual Activity: Yes    Partners: Male    Birth Control/ Protection: None   Other Topics Concern  . Not on file   Social History Narrative  . No narrative on file    Review of Systems: See HPI, otherwise negative ROS   Physical Exam: BP 133/90  Pulse 122  Temp(Src) 97.9 F (36.6 C) (Oral)  Resp 14  Ht 5\' 2"  (1.575 m)  Wt 145 lb (65.772 kg)  BMI 26.51 kg/m2  SpO2 99%  LMP 09/21/2013 General:   Alert,  pleasant and cooperative in NAD Head:  Normocephalic and atraumatic. Neck:  Supple;  Lungs:  Clear throughout to auscultation.    Heart:  Regular rate and rhythm. Abdomen:  Soft, nontender and nondistended. Normal bowel sounds, without guarding, and without rebound.   Neurologic:  Alert and  oriented x4;  grossly normal neurologically.  Impression/Plan:     Anemia  PLAN:  1. TCSTODAY

## 2013-12-21 ENCOUNTER — Encounter (HOSPITAL_COMMUNITY): Payer: Self-pay | Admitting: Gastroenterology

## 2014-01-02 ENCOUNTER — Telehealth: Payer: Self-pay | Admitting: Gastroenterology

## 2014-01-02 NOTE — Telephone Encounter (Signed)
Please call pt. She had a simple adenoma removed from her colon. HER BLOOD TEST SHOW A NEAR NORMAL BLOOD COUNT AND LIVER TEST. NO ADDITIONAL LABS TESTS OR PROCEDURES ARE NEEDED AT THIS TIME.     SHE DOES NOT NEED DEMADEX OR POTASSIUM.  CONTINUE PROTONIX AND METOLAZONE.  FOLLOW A HIGH FIBER DIET. AVOID ITEMS THAT CAUSE BLOATING & GAS. SEE INFO BELOW.  OPV E30 CIRRHOSIS AUG 2015. SHE WILL NEED REPEAT CMP, PT/INR, AFP, HAV TOTAL, AND CBC ONE WEEK PRIOR TO HER APPT.  Next colonoscopy in 5 years.

## 2014-01-03 NOTE — Telephone Encounter (Signed)
Reminder in epic and pt also has OV scheduled for 6/3 at 2 with SF for follow up of cirrhosis

## 2014-01-03 NOTE — Telephone Encounter (Signed)
Vm not set up. Will mail a letter to call.

## 2014-01-08 ENCOUNTER — Telehealth: Payer: Self-pay | Admitting: *Deleted

## 2014-01-08 NOTE — Telephone Encounter (Signed)
Pt was returning a call to the nurse for the letter she got on Saturday. Please advise [phone].

## 2014-01-08 NOTE — Telephone Encounter (Signed)
I returned pt's call and informed her of results. She is aware of OV appt with Dr. Oneida Alar on 01/10/2014 at 2:00 PM.

## 2014-01-10 ENCOUNTER — Ambulatory Visit: Payer: Self-pay | Admitting: Gastroenterology

## 2014-02-11 HISTORY — PX: ESOPHAGOGASTRODUODENOSCOPY: SHX5428

## 2014-02-22 ENCOUNTER — Telehealth: Payer: Self-pay | Admitting: Gastroenterology

## 2014-02-22 ENCOUNTER — Encounter (INDEPENDENT_AMBULATORY_CARE_PROVIDER_SITE_OTHER): Payer: Medicare Other | Admitting: Gastroenterology

## 2014-02-22 NOTE — Telephone Encounter (Signed)
Pt was a no show

## 2014-02-22 NOTE — Progress Notes (Signed)
   Subjective:    Patient ID: Wanda [name], female    DOB: [birth year], 38 y.o.   MRN: [id number]  No PCP Per Patient   HPI  Past Medical History  Diagnosis Date  . GERD (gastroesophageal reflux disease)   . Liver disease 10/05/13    Liver bx 11/23/13 (delayed initially due to patient refusal). c/w steatohepatitis  . Acute renal failure 09/2013    Pre-renal- resolved  . Folate deficiency 09/2013  . Anasarca 10/10/2013   Past Surgical History  Procedure Laterality Date  . None    . Paracentesis  Feb 2015    1180 fluid, negative fluid analysis.   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 Paracentesis  10/2013  . Colonoscopy N/A 12/19/2013    Procedure: COLONOSCOPY;  Surgeon: Danie Binder, MD;  Location: [facility];  Service: Endoscopy;  Laterality: N/A;  9:30      Review of Systems     Objective:   Physical Exam        Assessment & Plan:

## 2014-02-24 ENCOUNTER — Encounter (HOSPITAL_COMMUNITY): Payer: Self-pay | Admitting: Emergency Medicine

## 2014-02-24 ENCOUNTER — Inpatient Hospital Stay (HOSPITAL_COMMUNITY)
Admission: EM | Admit: 2014-02-24 | Discharge: 2014-03-02 | DRG: 432 | Disposition: A | Discharge status: Home / Self Care | Payer: Medicare Other | Attending: Internal Medicine | Admitting: Internal Medicine

## 2014-02-24 ENCOUNTER — Emergency Department (HOSPITAL_COMMUNITY): Payer: Medicare Other

## 2014-02-24 DIAGNOSIS — R188 Other ascites: Secondary | ICD-10-CM | POA: Diagnosis present

## 2014-02-24 DIAGNOSIS — D62 Acute posthemorrhagic anemia: Secondary | ICD-10-CM | POA: Diagnosis present

## 2014-02-24 DIAGNOSIS — E538 Deficiency of other specified B group vitamins: Secondary | ICD-10-CM

## 2014-02-24 DIAGNOSIS — I8500 Esophageal varices without bleeding: Secondary | ICD-10-CM

## 2014-02-24 DIAGNOSIS — I8511 Secondary esophageal varices with bleeding: Secondary | ICD-10-CM | POA: Diagnosis present

## 2014-02-24 DIAGNOSIS — E876 Hypokalemia: Secondary | ICD-10-CM | POA: Diagnosis present

## 2014-02-24 DIAGNOSIS — I959 Hypotension, unspecified: Secondary | ICD-10-CM | POA: Diagnosis present

## 2014-02-24 DIAGNOSIS — Z87891 Personal history of nicotine dependence: Secondary | ICD-10-CM

## 2014-02-24 DIAGNOSIS — K529 Noninfective gastroenteritis and colitis, unspecified: Secondary | ICD-10-CM

## 2014-02-24 DIAGNOSIS — K7031 Alcoholic cirrhosis of liver with ascites: Secondary | ICD-10-CM | POA: Diagnosis present

## 2014-02-24 DIAGNOSIS — N179 Acute kidney failure, unspecified: Secondary | ICD-10-CM | POA: Diagnosis present

## 2014-02-24 DIAGNOSIS — R601 Generalized edema: Secondary | ICD-10-CM

## 2014-02-24 DIAGNOSIS — K319 Disease of stomach and duodenum, unspecified: Secondary | ICD-10-CM | POA: Diagnosis present

## 2014-02-24 DIAGNOSIS — K7460 Unspecified cirrhosis of liver: Principal | ICD-10-CM

## 2014-02-24 DIAGNOSIS — Z9119 Patient's noncompliance with other medical treatment and regimen: Secondary | ICD-10-CM | POA: Diagnosis not present

## 2014-02-24 DIAGNOSIS — I498 Other specified cardiac arrhythmias: Secondary | ICD-10-CM

## 2014-02-24 DIAGNOSIS — D7589 Other specified diseases of blood and blood-forming organs: Secondary | ICD-10-CM | POA: Diagnosis present

## 2014-02-24 DIAGNOSIS — K7581 Nonalcoholic steatohepatitis (NASH): Secondary | ICD-10-CM

## 2014-02-24 DIAGNOSIS — E861 Hypovolemia: Secondary | ICD-10-CM | POA: Diagnosis present

## 2014-02-24 DIAGNOSIS — K7469 Other cirrhosis of liver: Secondary | ICD-10-CM

## 2014-02-24 DIAGNOSIS — N189 Chronic kidney disease, unspecified: Secondary | ICD-10-CM | POA: Diagnosis present

## 2014-02-24 DIAGNOSIS — K769 Liver disease, unspecified: Secondary | ICD-10-CM

## 2014-02-24 DIAGNOSIS — Z91199 Patient's noncompliance with other medical treatment and regimen due to unspecified reason: Secondary | ICD-10-CM | POA: Diagnosis not present

## 2014-02-24 DIAGNOSIS — I8501 Esophageal varices with bleeding: Secondary | ICD-10-CM

## 2014-02-24 DIAGNOSIS — K920 Hematemesis: Secondary | ICD-10-CM

## 2014-02-24 DIAGNOSIS — R000 Tachycardia, unspecified: Secondary | ICD-10-CM

## 2014-02-24 DIAGNOSIS — K3189 Other diseases of stomach and duodenum: Secondary | ICD-10-CM

## 2014-02-24 DIAGNOSIS — Z8249 Family history of ischemic heart disease and other diseases of the circulatory system: Secondary | ICD-10-CM

## 2014-02-24 DIAGNOSIS — K766 Portal hypertension: Secondary | ICD-10-CM | POA: Diagnosis present

## 2014-02-24 DIAGNOSIS — D509 Iron deficiency anemia, unspecified: Secondary | ICD-10-CM | POA: Diagnosis present

## 2014-02-24 DIAGNOSIS — K219 Gastro-esophageal reflux disease without esophagitis: Secondary | ICD-10-CM | POA: Diagnosis present

## 2014-02-24 DIAGNOSIS — K652 Spontaneous bacterial peritonitis: Secondary | ICD-10-CM

## 2014-02-24 DIAGNOSIS — F319 Bipolar disorder, unspecified: Secondary | ICD-10-CM

## 2014-02-24 DIAGNOSIS — T502X5A Adverse effect of carbonic-anhydrase inhibitors, benzothiadiazides and other diuretics, initial encounter: Secondary | ICD-10-CM | POA: Diagnosis present

## 2014-02-24 HISTORY — DX: Acute posthemorrhagic anemia: D62

## 2014-02-24 HISTORY — DX: Esophageal varices with bleeding: I85.01

## 2014-02-24 HISTORY — DX: Hematemesis: K92.0

## 2014-02-24 HISTORY — DX: Unspecified cirrhosis of liver: K74.60

## 2014-02-24 HISTORY — DX: Other specified diseases of blood and blood-forming organs: D75.89

## 2014-02-24 LAB — COMPREHENSIVE METABOLIC PANEL
ALT: 15 U/L (ref 0–35)
AST: 68 U/L — ABNORMAL HIGH (ref 0–37)
Albumin: 2.7 g/dL — ABNORMAL LOW (ref 3.5–5.2)
Alkaline Phosphatase: 156 U/L — ABNORMAL HIGH (ref 39–117)
Anion gap: 15 (ref 5–15)
BUN: 39 mg/dL — ABNORMAL HIGH (ref 6–23)
CALCIUM: 8.9 mg/dL (ref 8.4–10.5)
CO2: 29 mEq/L (ref 19–32)
CREATININE: 1.03 mg/dL (ref 0.50–1.10)
Chloride: 95 mEq/L — ABNORMAL LOW (ref 96–112)
GFR calc non Af Amer: 68 mL/min — ABNORMAL LOW (ref >90)
GFR, EST AFRICAN AMERICAN: 78 mL/min — AB (ref >90)
GLUCOSE: 104 mg/dL — AB (ref 70–99)
Potassium: 3 mEq/L — ABNORMAL LOW (ref 3.7–5.3)
Sodium: 139 mEq/L (ref 137–147)
TOTAL PROTEIN: 7.3 g/dL (ref 6.0–8.3)
Total Bilirubin: 3 mg/dL — ABNORMAL HIGH (ref 0.3–1.2)

## 2014-02-24 LAB — CBC WITH DIFFERENTIAL/PLATELET
BASOS ABS: 0.1 10*3/uL (ref 0.0–0.1)
Basophils Relative: 1 % (ref 0–1)
EOS ABS: 0 10*3/uL (ref 0.0–0.7)
EOS PCT: 0 % (ref 0–5)
HEMATOCRIT: 30.4 % — AB (ref 36.0–46.0)
Hemoglobin: 10.1 g/dL — ABNORMAL LOW (ref 12.0–15.0)
LYMPHS PCT: 20 % (ref 12–46)
Lymphs Abs: 2 10*3/uL (ref 0.7–4.0)
MCH: 34 pg (ref 26.0–34.0)
MCHC: 33.2 g/dL (ref 30.0–36.0)
MCV: 102.4 fL — AB (ref 78.0–100.0)
MONO ABS: 0.8 10*3/uL (ref 0.1–1.0)
Monocytes Relative: 8 % (ref 3–12)
Neutro Abs: 7.3 10*3/uL (ref 1.7–7.7)
Neutrophils Relative %: 71 % (ref 43–77)
Platelets: 212 10*3/uL (ref 150–400)
RBC: 2.97 MIL/uL — ABNORMAL LOW (ref 3.87–5.11)
RDW: 16.6 % — AB (ref 11.5–15.5)
WBC: 10.2 10*3/uL (ref 4.0–10.5)

## 2014-02-24 LAB — PROTIME-INR
INR: 1.2 (ref 0.00–1.49)
Prothrombin Time: 15.2 seconds (ref 11.6–15.2)

## 2014-02-24 LAB — I-STAT CG4 LACTIC ACID, ED: Lactic Acid, Venous: 1.6 mmol/L (ref 0.5–2.2)

## 2014-02-24 LAB — TROPONIN I: Troponin I: <0.3 ng/mL (ref <0.30)

## 2014-02-24 LAB — LIPASE, BLOOD: LIPASE: 36 U/L (ref 11–59)

## 2014-02-24 LAB — AMMONIA: Ammonia: 44 umol/L (ref 11–60)

## 2014-02-24 MED ORDER — OCTREOTIDE LOAD VIA INFUSION
50.0000 ug | Freq: Once | INTRAVENOUS | Status: AC
  Administered 2014-02-24: 50 ug via INTRAVENOUS
  Filled 2014-02-24: qty 25

## 2014-02-24 MED ORDER — OCTREOTIDE ACETATE 500 MCG/ML IJ SOLN
INTRAMUSCULAR | Status: AC
  Filled 2014-02-24: qty 1

## 2014-02-24 MED ORDER — PANTOPRAZOLE SODIUM 40 MG IV SOLR
40.0000 mg | Freq: Once | INTRAVENOUS | Status: AC
  Administered 2014-02-24: 40 mg via INTRAVENOUS
  Filled 2014-02-24: qty 40

## 2014-02-24 MED ORDER — SODIUM CHLORIDE 0.9 % IV SOLN
50.0000 ug/h | INTRAVENOUS | Status: AC
  Administered 2014-02-24 – 2014-02-28 (×9): 50 ug/h via INTRAVENOUS
  Filled 2014-02-24 (×12): qty 1

## 2014-02-24 MED ORDER — DEXTROSE 5 % IV SOLN
1.0000 g | Freq: Two times a day (BID) | INTRAVENOUS | Status: DC
  Administered 2014-02-24 – 2014-03-02 (×11): 1 g via INTRAVENOUS
  Filled 2014-02-24 (×18): qty 10

## 2014-02-24 NOTE — ED Notes (Signed)
Per EMS, patient has been vomiting today since 9am; states noticed blood in vomit this morning.  Denies abdominal pain.

## 2014-02-24 NOTE — H&P (Signed)
[facility] History and Physical  MARGERITE IMPASTATO GYI:[id number] DOB: [birth year] DOA: 02/24/2014   PCP: [facility]: Followed by Dr. Trinda Pascal with gastroenterology,   Chief Complaint: Vomiting blood  HPI: Wanda [name] is a 38 y.o. female with a past medical history of nonalcoholic liver cirrhosis, portal hypertension with ascites, status post paracentesis in the past, who was in her usual state of health earlier today when she started having vomiting. She vomited up blood. She's had at least 4-5 episodes of moderate quantity of blood in the emesis. Denies any fever, chills. Denies any abdominal pain currently. Has had some discomfort in the past. Denies any black stools. She has chronic loose stools. No recent changes to her bowel habits. Denies any chest pain or shortness of breath. Denies any dizziness, lightheadedness. She underwent an upper endoscopy in April, which showed gastropathy, esophagitis, and grade 1 varices.  Home Medications: Prior to Admission medications   Medication Sig Start Date End Date Taking? Authorizing Provider  lactulose (CHRONULAC) 10 GM/15ML solution Take 15 mLs (10 g total) by mouth every other day. 11/18/13  Yes Janece Canterbury, MD  metolazone (ZAROXOLYN) 5 MG tablet Take 1 tablet (5 mg total) by mouth daily. 12/19/13  Yes Danie Binder, MD    Allergies: No Known Allergies  Past Medical History: Past Medical History  Diagnosis Date  . GERD (gastroesophageal reflux disease)   . Liver disease 10/05/13    Liver bx 11/23/13 (delayed initially due to patient refusal). c/w steatohepatitis  . Acute renal failure 09/2013    Pre-renal- resolved  . Folate deficiency 09/2013  . Anasarca 10/10/2013    Past Surgical History  Procedure Laterality Date  . None    . Paracentesis  Feb 2015    1180 fluid, negative fluid analysis.   [name] Esophagogastroduodenoscopy N/A 11/14/2013    SLF:1 column of very small varices in distal esopahgus/MODERATE PORTAL  GASTROPATHY IN PROXIMAL STOMACH/MODERATE erosive gastritis  . Paracentesis  10/2013  . Colonoscopy N/A 12/19/2013    Procedure: COLONOSCOPY;  Surgeon: Danie Binder, MD;  Location: [facility];  Service: Endoscopy;  Laterality: N/A;  9:30    Social History: She lives in [place] with her boyfriend. Denies smoking, alcohol use and illicit drug use. Independent in daily activities usually  Family History:  Family History  Problem Relation Age of Onset  . Heart disease Mother   . Colon cancer Neg Hx   . Liver disease Neg Hx      Review of Systems - History obtained from the patient General ROS: positive for  - fatigue Psychological ROS: negative Ophthalmic ROS: negative ENT ROS: negative Allergy and Immunology ROS: negative Hematological and Lymphatic ROS: negative Endocrine ROS: negative Respiratory ROS: no cough, shortness of breath, or wheezing Cardiovascular ROS: no chest pain or dyspnea on exertion Gastrointestinal ROS: as in hpi Genito-Urinary ROS: no dysuria, trouble voiding, or hematuria Musculoskeletal ROS: negative Neurological ROS: no TIA or stroke symptoms Dermatological ROS: negative  Physical Examination  Filed Vitals:   02/24/14 2130 02/24/14 2200 02/24/14 2230 02/24/14 2245  BP: 119/65 104/58 114/66   Pulse: 134 126    Temp:      TempSrc:      Resp:    19  Height:      SpO2: 97% 96%      BP 114/66  Pulse 126  Temp(Src) 99.3 F (37.4 C) (Oral)  Resp 19  Ht _0  (1.575 m)  SpO2 96%  General appearance:  alert, cooperative, appears stated age and no distress Head: Normocephalic, without obvious abnormality, atraumatic Eyes: conjunctivae/corneas clear. PERRL, EOM's intact.  Throat: lips, mucosa, and tongue normal; teeth and gums normal Neck: no adenopathy, no carotid bruit, no JVD, supple, symmetrical, trachea midline and thyroid not enlarged, symmetric, no tenderness/mass/nodules Resp: clear to auscultation bilaterally Cardio: regular rate and rhythm,  S1, S2 normal, no murmur, click, rub or gallop GI: Abdomen is slightly distended. Vague diffuse tenderness without any rebound, rigidity, or guarding. No masses, or organomegaly. Ascites is present. Extremities: Minimal pedal edema bilaterally Pulses: 2+ and symmetric Neurologic: Alert and oriented x3. No focal neurological deficits are present.  Laboratory Data: Results for orders placed during the hospital encounter of 02/24/14 (from the past 48 hour(s))  CBC WITH DIFFERENTIAL     Status: Abnormal   Collection Time    02/24/14  9:05 PM      Result Value Ref Range   WBC 10.2  4.0 - 10.5 K/uL   RBC 2.97 (*) 3.87 - 5.11 MIL/uL   Hemoglobin 10.1 (*) 12.0 - 15.0 g/dL   HCT 30.4 (*) 36.0 - 46.0 %   MCV 102.4 (*) 78.0 - 100.0 fL   MCH 34.0  26.0 - 34.0 pg   MCHC 33.2  30.0 - 36.0 g/dL   RDW 16.6 (*) 11.5 - 15.5 %   Platelets 212  150 - 400 K/uL   Neutrophils Relative % 71  43 - 77 %   Neutro Abs 7.3  1.7 - 7.7 K/uL   Lymphocytes Relative 20  12 - 46 %   Lymphs Abs 2.0  0.7 - 4.0 K/uL   Monocytes Relative 8  3 - 12 %   Monocytes Absolute 0.8  0.1 - 1.0 K/uL   Eosinophils Relative 0  0 - 5 %   Eosinophils Absolute 0.0  0.0 - 0.7 K/uL   Basophils Relative 1  0 - 1 %   Basophils Absolute 0.1  0.0 - 0.1 K/uL  COMPREHENSIVE METABOLIC PANEL     Status: Abnormal   Collection Time    02/24/14  9:05 PM      Result Value Ref Range   Sodium 139  137 - 147 mEq/L   Potassium 3.0 (*) 3.7 - 5.3 mEq/L   Chloride 95 (*) 96 - 112 mEq/L   CO2 29  19 - 32 mEq/L   Glucose, Bld 104 (*) 70 - 99 mg/dL   BUN 39 (*) 6 - 23 mg/dL   Creatinine, Ser 1.03  0.50 - 1.10 mg/dL   Calcium 8.9  8.4 - 10.5 mg/dL   Total Protein 7.3  6.0 - 8.3 g/dL   Albumin 2.7 (*) 3.5 - 5.2 g/dL   AST 68 (*) 0 - 37 U/L   ALT 15  0 - 35 U/L   Alkaline Phosphatase 156 (*) 39 - 117 U/L   Total Bilirubin 3.0 (*) 0.3 - 1.2 mg/dL   GFR calc non Af Amer 68 (*) >90 mL/min   GFR calc Af Amer 78 (*) >90 mL/min   Comment: (NOTE)      The eGFR has been calculated using the CKD EPI equation.     This calculation has not been validated in all clinical situations.     eGFR's persistently <90 mL/min signify possible Chronic Kidney     Disease.   Anion gap 15  5 - 15  PROTIME-INR     Status: None   Collection Time    02/24/14  9:15  PM      Result Value Ref Range   Prothrombin Time 15.2  11.6 - 15.2 seconds   INR 1.20  0.00 - 1.49  TROPONIN I     Status: None   Collection Time    02/24/14  9:15 PM      Result Value Ref Range   Troponin I <0.30  <0.30 ng/mL   Comment:            Due to the release kinetics of cTnI,     a negative result within the first hours     of the onset of symptoms does not rule out     myocardial infarction with certainty.     If myocardial infarction is still suspected,     repeat the test at appropriate intervals.  LIPASE, BLOOD     Status: None   Collection Time    02/24/14  9:15 PM      Result Value Ref Range   Lipase 36  11 - 59 U/L  TYPE AND SCREEN     Status: None   Collection Time    02/24/14  9:15 PM      Result Value Ref Range   ABO/RH(D) A POS     Antibody Screen NEG     Sample Expiration 02/27/2014    AMMONIA     Status: None   Collection Time    02/24/14  9:15 PM      Result Value Ref Range   Ammonia 44  11 - 60 umol/L  I-STAT CG4 LACTIC ACID, ED     Status: None   Collection Time    02/24/14 10:26 PM      Result Value Ref Range   Lactic Acid, Venous 1.60  0.5 - 2.2 mmol/L    Radiology Reports: Dg Abd Acute W/chest  02/24/2014   CLINICAL DATA:  Abdominal pain, pressure  EXAM: ACUTE ABDOMEN SERIES (ABDOMEN 2 VIEW & CHEST 1 VIEW)  COMPARISON:  None.  FINDINGS: There is no evidence of dilated bowel loops or free intraperitoneal air. No radiopaque calculi or other significant radiographic abnormality is seen. Heart size and mediastinal contours are within normal limits. Both lungs are clear.  IMPRESSION: Negative abdominal radiographs.  No acute cardiopulmonary disease.    Electronically Signed   By: Kathreen Devoid   On: 02/24/2014 22:50    Electrocardiogram: Sinus tachycardia at 132 beats per minute. Normal axis. QT prolongation is noted. Possible Q wave in lead V3. No concerning ST or T-wave changes.  Problem List  Principal Problem:   Hematemesis/vomiting blood Active Problems:   Ascites   Sinus tachycardia   Cirrhosis   Hypokalemia   Assessment: This is a 38 year old, Caucasian female, who presents with hematemesis. She has a history of liver cirrhosis and portal hypertension. She has grade 1 varices but also has esophagitis. Reason for hematemesis is probably either of the 2. Denies any NSAID use.  Plan: #1 hematemesis: She'll be admitted to the step down unit. PPP and octreotide will be given. Gastroenterology has been consulted and they will evaluate her in the morning. She'll be kept n.p.o. for possible endoscopy.  #2 history of liver cirrhosis with portal hypertension and ascites: She does have abdominal distention worse from before. She does have vague abdominal discomfort. There is a concern for SBP. We will give her ceftriaxone for now. Further management per GI.  #3 sinus tachycardia: Heart rate has been elevated in the past. This elevation could be due to  acute illness. Lactic acid level is normal. We will give her gentle IV hydration and monitor her on telemetry. She denies any chest pain or shortness of breath.  #4 hypokalemia:  this will be repleted intravenously.  #5 microcytic anemia: Hemoglobin appears to be close to baseline. Continue to monitor due to acute blood loss. Transfuse as needed.   DVT Prophylaxis: SCDs Code Status: Full code Family Communication: Discussed with the patient  Disposition Plan: Admit to step down   Further management decisions will depend on results of further testing and patient's response to treatment.   [facility]  [facility] Pager [phone]  If 7PM-7AM, please contact  night-coverage [email or web address] Password [facility]  02/24/2014, 11:15 PM  Disclaimer: This note was dictated with voice recognition software. Similar sounding words can inadvertently be transcribed and may not be corrected upon review.

## 2014-02-24 NOTE — ED Provider Notes (Signed)
CSN: [id number]     Arrival date & time 02/24/14  2042 History   First MD Initiated Contact with Patient 02/24/14 2103     This chart was scribed for Wanda [name], * by Forrestine Him, ED Scribe. This patient was seen in room [facility]06/[facility]06 and the patient's care was started 9:10 PM.   Chief Complaint  Patient presents with  . Hematemesis   HPI  HPI Comments: [name] is a 38 y.o. female with a PMHx of GERD, liver disease, and acute renal failure who presents to the Emergency Department complaining of moderate hematemesis onset this morning approximately 9 AM. Pt states she initially noted mucous but soon after noted a large amount of blood in her vomit. She admits to only 1 episode today. She also reports associated nausea a this time. Pt recently had an esophagogastroduodenoscopy 11/14/2013 with very small varices in distal esophagus noted. At this time she denies any fever, chills, or abdominal pain. No black or tarry stool. No known allergies to medications. She has no other pertinent past medical history. No other concerns this visit.   Past Medical History  Diagnosis Date  . GERD (gastroesophageal reflux disease)   . Liver disease 10/05/13    Liver bx 11/23/13 (delayed initially due to patient refusal). c/w steatohepatitis  . Acute renal failure 09/2013    Pre-renal- resolved  . Folate deficiency 09/2013  . Anasarca 10/10/2013   Past Surgical History  Procedure Laterality Date  . None    . Paracentesis  Feb 2015    1180 fluid, negative fluid analysis.   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 Paracentesis  10/2013  . Colonoscopy N/A 12/19/2013    Procedure: COLONOSCOPY;  Surgeon: Danie Binder, MD;  Location: [facility];  Service: Endoscopy;  Laterality: N/A;  9:30   Family History  Problem Relation Age of Onset  . Heart disease  Mother   . Colon cancer Neg Hx   . Liver disease Neg Hx    History  Substance Use Topics  . Smoking status: Former Smoker -- 0.25 packs/day for 20 years    Types: Cigarettes    Quit date: 11/05/2008  . Smokeless tobacco: Never Used  . Alcohol Use: No   OB History   Grav Para Term Preterm Abortions TAB SAB Ect Mult Living   1 1 1       1      Review of Systems  Constitutional: Negative for fever and chills.  Gastrointestinal: Positive for vomiting and abdominal pain.  Skin: Negative for rash.  Psychiatric/Behavioral: Negative for confusion.      Allergies  Review of patient's allergies indicates no known allergies.  Home Medications   Prior to Admission medications   Medication Sig Start Date End Date Taking? Authorizing Provider  lactulose (CHRONULAC) 10 GM/15ML solution Take 15 mLs (10 g total) by mouth every other day. 11/18/13  Yes Janece Canterbury, MD  metolazone (ZAROXOLYN) 5 MG tablet Take 1 tablet (5 mg total) by mouth daily. 12/19/13  Yes Danie Binder, MD   Triage Vitals: BP 116/68  Pulse 133  Temp(Src) 99.3 F (37.4 C) (Oral)  Resp 18  Ht 5\' 2"  (1.575 m)  SpO2 100%   Physical Exam  Constitutional: She is oriented to person, place, and time. She appears well-developed and well-nourished. No distress.  Mildly jaundice and pale  HENT:  Head: Normocephalic and atraumatic.  Right Ear: Hearing normal.  Left Ear: Hearing normal.  Nose: Nose normal.  Mouth/Throat: Oropharynx is clear and moist and mucous membranes are normal.  Eyes: Conjunctivae and EOM are normal. Pupils are equal, round, and reactive to light.  Neck: Normal range of motion. Neck supple.  Cardiovascular: Regular rhythm, S1 normal and S2 normal.  Exam reveals no gallop and no friction rub.   No murmur heard. Pulmonary/Chest: Effort normal and breath sounds normal. No respiratory distress. She exhibits no tenderness.  Abdominal: Soft. Normal appearance and bowel sounds are normal. She exhibits  distension. There is no hepatosplenomegaly. There is tenderness. There is no rebound, no guarding, no tenderness at McBurney's point and negative Murphy's sign. No hernia.  Genitourinary: Guaiac positive stool (black stool).  Musculoskeletal: Normal range of motion.  Neurological: She is alert and oriented to person, place, and time. She has normal strength. No cranial nerve deficit or sensory deficit. Coordination normal. GCS eye subscore is 4. GCS verbal subscore is 5. GCS motor subscore is 6.  Skin: Skin is warm, dry and intact. No rash noted. No cyanosis.  Psychiatric: She has a normal mood and affect. Her speech is normal and behavior is normal. Thought content normal.    ED Course  Procedures (including critical care time)  DIAGNOSTIC STUDIES: Oxygen Saturation is 100% on RA, Normal by my interpretation.    COORDINATION OF CARE: 9:18 PM- Will order CBC, EKG, and CMP. Discussed treatment plan with pt at bedside and pt agreed to plan.     Labs Review Labs Reviewed  CBC WITH DIFFERENTIAL - Abnormal; Notable for the following:    RBC 2.97 (*)    Hemoglobin 10.1 (*)    HCT 30.4 (*)    MCV 102.4 (*)    RDW 16.6 (*)    All other components within normal limits  COMPREHENSIVE METABOLIC PANEL - Abnormal; Notable for the following:    Potassium 3.0 (*)    Chloride 95 (*)    Glucose, Bld 104 (*)    BUN 39 (*)    Albumin 2.7 (*)    AST 68 (*)    Alkaline Phosphatase 156 (*)    Total Bilirubin 3.0 (*)    GFR calc non Af Amer 68 (*)    GFR calc Af Amer 78 (*)    All other components within normal limits  CULTURE, BLOOD (ROUTINE X 2)  CULTURE, BLOOD (ROUTINE X 2)  PROTIME-INR  TROPONIN I  LIPASE, BLOOD  AMMONIA  URINALYSIS, ROUTINE W REFLEX MICROSCOPIC  I-STAT CG4 LACTIC ACID, ED  TYPE AND SCREEN  SAMPLE TO BLOOD BANK    Imaging Review No results found.   EKG Interpretation   Date/Time:  Saturday February 24 2014 20:48:19 EDT Ventricular Rate:  132 PR Interval:  114 QRS  Duration: 70 QT Interval:  431 QTC Calculation: 639 R Axis:   70 Text Interpretation:  Sinus tachycardia Non-specific ST-t changes  Prolonged QT interval No significant change since last tracing Confirmed  by POLLINA  MD, [place] [phone]) on 02/24/2014 9:22:50 PM      MDM   Final diagnoses:  None  Hematemesis Cirrhosis Tachycardia Possible SBP  Patient presents to the ER for evaluation of hematemesis. Patient reports a previous history of nonalcoholic liver disease. She has had a GI bleed in the past. Reviewing her records reveals EGD and colonoscopy in April of this year. She had erosive esophagitis and small varices. Patient complaining of some abdominal distention.  She has also had abdominal ascites requiring paracentesis. There is no clear evidence of spontaneous bacterial peritonitis at this time.  She appears. She is pallid and appears jaundiced. LFTs reveal mild elevation of her bilirubin above baseline. She does not have any significant coagulopathy, INR 1.2. Platelets are above 200. Hemoglobin is 10.1, baseline is in the 11s.  Patient has not had any hematemesis here in the ER. Stool is black, melanotic and heme-positive.  She was tachycardic upon arrival. Etiology is unclear at this time. She does not have any significant anemia. SBP would be a possibility, but is afebrile and has minimal abdominal tenderness.  Case discussed with Doctor Rourk, tetralogy. He recommends octreotide bolus and drip, Rocephin, Protonix. Will see the patient in the morning. Patient to be admitted to the hospitalist.  I personally performed the services described in this documentation, which was scribed in my presence. The recorded information has been reviewed and is accurate.    Wanda Greek, MD 02/24/14 2240

## 2014-02-25 ENCOUNTER — Inpatient Hospital Stay (HOSPITAL_COMMUNITY): Payer: Medicare Other

## 2014-02-25 ENCOUNTER — Encounter (HOSPITAL_COMMUNITY)
Admission: EM | Disposition: A | Discharge status: Home / Self Care | Payer: Self-pay | Source: Home / Self Care | Attending: Internal Medicine

## 2014-02-25 DIAGNOSIS — D62 Acute posthemorrhagic anemia: Secondary | ICD-10-CM

## 2014-02-25 DIAGNOSIS — R110 Nausea: Secondary | ICD-10-CM

## 2014-02-25 DIAGNOSIS — K920 Hematemesis: Secondary | ICD-10-CM

## 2014-02-25 HISTORY — DX: Acute posthemorrhagic anemia: D62

## 2014-02-25 LAB — CBC
HCT: 23.5 % — ABNORMAL LOW (ref 36.0–46.0)
HCT: 24.6 % — ABNORMAL LOW (ref 36.0–46.0)
HEMATOCRIT: 23.3 % — AB (ref 36.0–46.0)
HEMATOCRIT: 25 % — AB (ref 36.0–46.0)
Hemoglobin: 8 g/dL — ABNORMAL LOW (ref 12.0–15.0)
Hemoglobin: 7.7 g/dL — ABNORMAL LOW (ref 12.0–15.0)
Hemoglobin: 8.3 g/dL — ABNORMAL LOW (ref 12.0–15.0)
Hemoglobin: 8.1 g/dL — ABNORMAL LOW (ref 12.0–15.0)
MCH: 35.4 pg — AB (ref 26.0–34.0)
MCH: 33.9 pg (ref 26.0–34.0)
MCH: 33.6 pg (ref 26.0–34.0)
MCH: 33.2 pg (ref 26.0–34.0)
MCHC: 34.3 g/dL (ref 30.0–36.0)
MCHC: 32.8 g/dL (ref 30.0–36.0)
MCHC: 33.2 g/dL (ref 30.0–36.0)
MCHC: 32.9 g/dL (ref 30.0–36.0)
MCV: 103.1 fL — ABNORMAL HIGH (ref 78.0–100.0)
MCV: 103.5 fL — ABNORMAL HIGH (ref 78.0–100.0)
MCV: 101.2 fL — ABNORMAL HIGH (ref 78.0–100.0)
MCV: 100.8 fL — ABNORMAL HIGH (ref 78.0–100.0)
PLATELETS: 167 10*3/uL (ref 150–400)
PLATELETS: 154 10*3/uL (ref 150–400)
Platelets: 164 10*3/uL (ref 150–400)
Platelets: 156 10*3/uL (ref 150–400)
RBC: 2.26 MIL/uL — ABNORMAL LOW (ref 3.87–5.11)
RBC: 2.27 MIL/uL — AB (ref 3.87–5.11)
RBC: 2.47 MIL/uL — ABNORMAL LOW (ref 3.87–5.11)
RBC: 2.44 MIL/uL — AB (ref 3.87–5.11)
RDW: 16.6 % — AB (ref 11.5–15.5)
RDW: 16.8 % — ABNORMAL HIGH (ref 11.5–15.5)
RDW: 18.7 % — ABNORMAL HIGH (ref 11.5–15.5)
RDW: 19.2 % — ABNORMAL HIGH (ref 11.5–15.5)
WBC: 7.8 10*3/uL (ref 4.0–10.5)
WBC: 7.8 10*3/uL (ref 4.0–10.5)
WBC: 6.8 10*3/uL (ref 4.0–10.5)
WBC: 6.5 10*3/uL (ref 4.0–10.5)

## 2014-02-25 LAB — COMPREHENSIVE METABOLIC PANEL
ALT: 11 U/L (ref 0–35)
AST: 50 U/L — AB (ref 0–37)
Albumin: 2.1 g/dL — ABNORMAL LOW (ref 3.5–5.2)
Alkaline Phosphatase: 118 U/L — ABNORMAL HIGH (ref 39–117)
Anion gap: 13 (ref 5–15)
BUN: 47 mg/dL — ABNORMAL HIGH (ref 6–23)
CALCIUM: 8.2 mg/dL — AB (ref 8.4–10.5)
CO2: 28 meq/L (ref 19–32)
Chloride: 98 mEq/L (ref 96–112)
Creatinine, Ser: 1.15 mg/dL — ABNORMAL HIGH (ref 0.50–1.10)
GFR, EST AFRICAN AMERICAN: 69 mL/min — AB (ref >90)
GFR, EST NON AFRICAN AMERICAN: 59 mL/min — AB (ref >90)
Glucose, Bld: 118 mg/dL — ABNORMAL HIGH (ref 70–99)
POTASSIUM: 3.3 meq/L — AB (ref 3.7–5.3)
SODIUM: 139 meq/L (ref 137–147)
Total Bilirubin: 2.5 mg/dL — ABNORMAL HIGH (ref 0.3–1.2)
Total Protein: 5.9 g/dL — ABNORMAL LOW (ref 6.0–8.3)

## 2014-02-25 LAB — URINALYSIS, ROUTINE W REFLEX MICROSCOPIC
Glucose, UA: NEGATIVE mg/dL
HGB URINE DIPSTICK: NEGATIVE
Ketones, ur: NEGATIVE mg/dL
LEUKOCYTES UA: NEGATIVE
NITRITE: POSITIVE — AB
SPECIFIC GRAVITY, URINE: 1.01 (ref 1.005–1.030)
UROBILINOGEN UA: 0.2 mg/dL (ref 0.0–1.0)
pH: 5.5 (ref 5.0–8.0)

## 2014-02-25 LAB — URINE MICROSCOPIC-ADD ON

## 2014-02-25 LAB — SODIUM, URINE, RANDOM: Sodium, Ur: 20 mEq/L

## 2014-02-25 LAB — PREPARE RBC (CROSSMATCH)

## 2014-02-25 LAB — CREATININE, URINE, RANDOM: Creatinine, Urine: 141.77 mg/dL

## 2014-02-25 LAB — MRSA PCR SCREENING: MRSA by PCR: NEGATIVE

## 2014-02-25 SURGERY — EGD (ESOPHAGOGASTRODUODENOSCOPY)
Anesthesia: Moderate Sedation

## 2014-02-25 MED ORDER — FENTANYL CITRATE 0.05 MG/ML IJ SOLN: 100.0000 ug | INTRAMUSCULAR | Status: DC | PRN

## 2014-02-25 MED ORDER — SODIUM CHLORIDE 0.9 % IV SOLN
40.0000 ug/h | INTRAVENOUS | Status: DC
  Administered 2014-02-25: 40 ug/h via INTRAVENOUS
  Administered 2014-02-26: 30 ug/h via INTRAVENOUS
  Administered 2014-02-26: 20 ug/h via INTRAVENOUS
  Filled 2014-02-25: qty 50

## 2014-02-25 MED ORDER — SUCCINYLCHOLINE CHLORIDE 20 MG/ML IJ SOLN
INTRAMUSCULAR | Status: AC
  Filled 2014-02-25: qty 1

## 2014-02-25 MED ORDER — LIDOCAINE HCL (CARDIAC) 20 MG/ML IV SOLN
INTRAVENOUS | Status: AC
  Filled 2014-02-25: qty 5

## 2014-02-25 MED ORDER — PANTOPRAZOLE SODIUM 40 MG IV SOLR
40.0000 mg | Freq: Two times a day (BID) | INTRAVENOUS | Status: DC
  Administered 2014-02-25 – 2014-02-27 (×6): 40 mg via INTRAVENOUS
  Filled 2014-02-25 (×6): qty 40

## 2014-02-25 MED ORDER — SODIUM CHLORIDE 0.9 % IV SOLN
1.0000 mg/h | INTRAVENOUS | Status: AC
  Administered 2014-02-25: 1 mg/h via INTRAVENOUS
  Filled 2014-02-25: qty 10

## 2014-02-25 MED ORDER — MIDAZOLAM BOLUS VIA INFUSION
1.0000 mg | INTRAVENOUS | Status: DC | PRN
  Filled 2014-02-25: qty 2

## 2014-02-25 MED ORDER — MIDAZOLAM HCL 10 MG/2ML IJ SOLN
INTRAMUSCULAR | Status: AC
  Administered 2014-02-25: 4 mg
  Filled 2014-02-25: qty 2

## 2014-02-25 MED ORDER — MEPERIDINE HCL 100 MG/ML IJ SOLN
INTRAMUSCULAR | Status: AC
  Administered 2014-02-25: 50 mg
  Filled 2014-02-25: qty 2

## 2014-02-25 MED ORDER — SODIUM CHLORIDE 0.9 % IV SOLN
0.0000 mg/h | INTRAVENOUS | Status: DC
  Administered 2014-02-25 – 2014-02-26 (×2): 2 mg/h via INTRAVENOUS
  Filled 2014-02-25 (×7): qty 10

## 2014-02-25 MED ORDER — FENTANYL CITRATE 0.05 MG/ML IJ SOLN
INTRAMUSCULAR | Status: AC
  Administered 2014-02-25: 50 ug
  Filled 2014-02-25: qty 2

## 2014-02-25 MED ORDER — POTASSIUM CHLORIDE 10 MEQ/100ML IV SOLN
10.0000 meq | INTRAVENOUS | Status: AC
  Administered 2014-02-25 (×4): 10 meq via INTRAVENOUS
  Filled 2014-02-25 (×4): qty 100

## 2014-02-25 MED ORDER — ACETAMINOPHEN 650 MG RE SUPP: 650.0000 mg | Freq: Four times a day (QID) | RECTAL | Status: DC | PRN

## 2014-02-25 MED ORDER — SODIUM CHLORIDE 0.9 % IV BOLUS (SEPSIS)
250.0000 mL | Freq: Once | INTRAVENOUS | Status: AC
  Administered 2014-02-25: 250 mL via INTRAVENOUS

## 2014-02-25 MED ORDER — ONDANSETRON HCL 4 MG/2ML IJ SOLN: 4.0000 mg | Freq: Four times a day (QID) | INTRAMUSCULAR | Status: DC | PRN

## 2014-02-25 MED ORDER — FENTANYL CITRATE 0.05 MG/ML IJ SOLN
50.0000 ug | Freq: Once | INTRAMUSCULAR | Status: AC
  Administered 2014-02-25: 50 ug via INTRAVENOUS

## 2014-02-25 MED ORDER — SODIUM CHLORIDE 0.9 % IV SOLN
INTRAVENOUS | Status: AC
  Administered 2014-02-25: 01:00:00 via INTRAVENOUS

## 2014-02-25 MED ORDER — SODIUM CHLORIDE 0.9 % IV SOLN
10.0000 ug/h | INTRAVENOUS | Status: AC
  Administered 2014-02-25: 50 ug/h via INTRAVENOUS
  Filled 2014-02-25: qty 50

## 2014-02-25 MED ORDER — MIDAZOLAM HCL 5 MG/5ML IJ SOLN
INTRAMUSCULAR | Status: AC
  Filled 2014-02-25: qty 10

## 2014-02-25 MED ORDER — ETOMIDATE 2 MG/ML IV SOLN
INTRAVENOUS | Status: AC
  Filled 2014-02-25: qty 20

## 2014-02-25 MED ORDER — SODIUM CHLORIDE 0.9 % IJ SOLN: 10.0000 mL | INTRAMUSCULAR | Status: DC | PRN

## 2014-02-25 MED ORDER — BIOTENE DRY MOUTH MT LIQD
15.0000 mL | Freq: Two times a day (BID) | OROMUCOSAL | Status: DC
  Administered 2014-02-25 (×2): 15 mL via OROMUCOSAL

## 2014-02-25 MED ORDER — SODIUM CHLORIDE 0.9 % IJ SOLN
10.0000 mL | Freq: Two times a day (BID) | INTRAMUSCULAR | Status: DC
  Administered 2014-02-25 – 2014-03-01 (×9): 10 mL

## 2014-02-25 MED ORDER — CHLORHEXIDINE GLUCONATE 0.12 % MT SOLN
15.0000 mL | Freq: Two times a day (BID) | OROMUCOSAL | Status: DC
  Administered 2014-02-26 – 2014-02-28 (×5): 15 mL via OROMUCOSAL
  Filled 2014-02-25 (×5): qty 15

## 2014-02-25 MED ORDER — ALPRAZOLAM 0.25 MG PO TABS
0.2500 mg | ORAL_TABLET | Freq: Once | ORAL | Status: AC
Start: 2014-02-25 — End: 2014-02-25
  Administered 2014-02-25: 0.25 mg via ORAL
  Filled 2014-02-25: qty 1

## 2014-02-25 MED ORDER — ROCURONIUM BROMIDE 50 MG/5ML IV SOLN
INTRAVENOUS | Status: AC
  Filled 2014-02-25: qty 2

## 2014-02-25 MED ORDER — ONDANSETRON HCL 4 MG PO TABS: 4.0000 mg | ORAL_TABLET | Freq: Four times a day (QID) | ORAL | Status: DC | PRN

## 2014-02-25 MED ORDER — MIDAZOLAM HCL 2 MG/2ML IJ SOLN
INTRAMUSCULAR | Status: AC
  Administered 2014-02-25: 13:00:00
  Filled 2014-02-25: qty 2

## 2014-02-25 MED ORDER — EPINEPHRINE HCL 0.1 MG/ML IJ SOSY
PREFILLED_SYRINGE | INTRAMUSCULAR | Status: AC
  Filled 2014-02-25: qty 10

## 2014-02-25 MED ORDER — ONDANSETRON HCL 4 MG/2ML IJ SOLN
INTRAMUSCULAR | Status: AC
  Administered 2014-02-25: 4 mg
  Filled 2014-02-25: qty 2

## 2014-02-25 MED ORDER — ALBUTEROL SULFATE (2.5 MG/3ML) 0.083% IN NEBU: 2.5000 mg | INHALATION_SOLUTION | RESPIRATORY_TRACT | Status: DC | PRN

## 2014-02-25 MED ORDER — CHLORHEXIDINE GLUCONATE 0.12 % MT SOLN
15.0000 mL | Freq: Two times a day (BID) | OROMUCOSAL | Status: DC
  Administered 2014-02-25 (×2): 15 mL via OROMUCOSAL
  Filled 2014-02-25 (×2): qty 15

## 2014-02-25 MED ORDER — BIOTENE DRY MOUTH MT LIQD
15.0000 mL | Freq: Four times a day (QID) | OROMUCOSAL | Status: DC
  Administered 2014-02-26 – 2014-03-01 (×16): 15 mL via OROMUCOSAL

## 2014-02-25 MED ORDER — FENTANYL CITRATE 0.05 MG/ML IJ SOLN
100.0000 ug | INTRAMUSCULAR | Status: DC | PRN
  Administered 2014-02-26: 50 ug via INTRAVENOUS
  Administered 2014-02-27 – 2014-02-28 (×2): 100 ug via INTRAVENOUS
  Filled 2014-02-25 (×2): qty 2

## 2014-02-25 MED ORDER — ACETAMINOPHEN 325 MG PO TABS
650.0000 mg | ORAL_TABLET | Freq: Four times a day (QID) | ORAL | Status: DC | PRN
  Administered 2014-02-25: 650 mg via ORAL
  Filled 2014-02-25: qty 2

## 2014-02-25 MED ORDER — SODIUM CHLORIDE 0.9 % IJ SOLN
3.0000 mL | Freq: Two times a day (BID) | INTRAMUSCULAR | Status: DC
  Administered 2014-02-25 – 2014-03-02 (×10): 3 mL via INTRAVENOUS

## 2014-02-25 NOTE — Consult Note (Signed)
Referring Provider: No ref. provider found Primary Care Physician:  No PCP Per Patient Primary Gastroenterologist:  Dr. Oneida Alar  Reason for Consultation:  Hematemesis  HPI:   38 year old lady with steatohepatitis/cirrhosis admitted to the hospital last evening after multiple episodes of hematemesis at home. Tachycardic on presentation with a relatively normal blood pressure. Melena on rectal exam per Dr. Waverly Ferrari.  Admitted to the ICU. 3 g drop in hemoglobin overnight: Currently 7.7. Platelet count 160 K. INR 1.2. No further hematemesis since being admitted to the ICU last evening. Pulse in the 100 range. Blood pressure in the 90 systolic range.  EGD in March of this year revealed a small distal esophageal varix and portal gastropathy. Colonoscopy-adenoma removed. Patient denies NSAID use. She is supposed to be taking Cipro daily for SBP prophylaxis and Inderal. She tells me she ran out of former and the latter make her feel badly and she stopped this medication on her own.  She missed her recent appointment with Dr. Leonie Man she was "sick".  1 paracentesis previously-analysis negative for SBP  Hospitalist is planning to transfuse her 1 unit of packed red cells now. She was started on ceftriaxone and Sandostatin intravenously last evening.  IV PPI. Patient denies any alcohol ingestion.   Resting tachycardia in the 120s noted as an outpatient at relative baseline of uncertain significance. Supposedly was taking Inderal at that time. Cardiology consultation recommended but patient never followed through. TSH normal earlier this year.  Past Medical History  Diagnosis Date  . GERD (gastroesophageal reflux disease)   . Liver disease 10/05/13    Liver bx 11/23/13 (delayed initially due to patient refusal). c/w steatohepatitis  . Acute renal failure 09/2013    Pre-renal- resolved  . Folate deficiency 09/2013  . Anasarca 10/10/2013    Past Surgical History  Procedure Laterality Date  . None      . Paracentesis  Feb 2015    1180 fluid, negative fluid analysis.   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 Paracentesis  10/2013  . Colonoscopy N/A 12/19/2013    Procedure: COLONOSCOPY;  Surgeon: Danie Binder, MD;  Location: [facility];  Service: Endoscopy;  Laterality: N/A;  9:30    Prior to Admission medications   Medication Sig Start Date End Date Taking? Authorizing Provider  lactulose (CHRONULAC) 10 GM/15ML solution Take 15 mLs (10 g total) by mouth every other day. 11/18/13  Yes Janece Canterbury, MD  metolazone (ZAROXOLYN) 5 MG tablet Take 1 tablet (5 mg total) by mouth daily. 12/19/13  Yes Danie Binder, MD    Current Facility-Administered Medications  Medication Dose Route Frequency Provider Last Rate Last Dose  . 0.9 %  sodium chloride infusion   Intravenous Continuous Bonnielee Haff, MD 50 mL/hr at 02/25/14 0800    . acetaminophen (TYLENOL) tablet 650 mg  650 mg Oral Q6H PRN Bonnielee Haff, MD   650 mg at 02/25/14 1021   Or  . acetaminophen (TYLENOL) suppository 650 mg  650 mg Rectal Q6H PRN Bonnielee Haff, MD      . albuterol (PROVENTIL) (2.5 MG/3ML) 0.083% nebulizer solution 2.5 mg  2.5 mg Nebulization Q2H PRN Bonnielee Haff, MD      . antiseptic oral rinse (BIOTENE) solution 15 mL  15 mL Mouth Rinse q12n4p Bonnielee Haff, MD      . cefTRIAXone (ROCEPHIN) 1 g in dextrose 5 % 50 mL  IVPB  1 g Intravenous Q12H [name], MD 100 mL/hr at 02/25/14 1027 1 g at 02/25/14 1027  . chlorhexidine (PERIDEX) 0.12 % solution 15 mL  15 mL Mouth Rinse BID Bonnielee Haff, MD   15 mL at 02/25/14 0806  . octreotide (SANDOSTATIN) 2 mcg/mL in sodium chloride 0.9 % 250 mL infusion  50 mcg/hr Intravenous Continuous [name], MD 25 mL/hr at 02/25/14 0821 50 mcg/hr at 02/25/14 0821  . ondansetron (ZOFRAN) tablet 4 mg  4 mg Oral Q6H PRN  Bonnielee Haff, MD       Or  . ondansetron (ZOFRAN) injection 4 mg  4 mg Intravenous Q6H PRN Bonnielee Haff, MD      . pantoprazole (PROTONIX) injection 40 mg  40 mg Intravenous Q12H Bonnielee Haff, MD   40 mg at 02/25/14 1022  . sodium chloride 0.9 % injection 3 mL  3 mL Intravenous Q12H Bonnielee Haff, MD   3 mL at 02/25/14 0122    Allergies as of 02/24/2014  . (No Known Allergies)    Family History  Problem Relation Age of Onset  . Heart disease Mother   . Colon cancer Neg Hx   . Liver disease Neg Hx     History   Social History  . Marital Status: Single    Spouse Name: N/A    Number of Children: 1  . Years of Education: N/A   Occupational History  . unemployed    Social History Main Topics  . Smoking status: Former Smoker -- 0.25 packs/day for 20 years    Types: Cigarettes    Quit date: 11/05/2008  . Smokeless tobacco: Never Used  . Alcohol Use: No  . Drug Use: No  . Sexual Activity: Yes    Partners: Male    Birth Control/ Protection: None   Other Topics Concern  . Not on file   Social History Narrative  . No narrative on file    Review of Systems: Gen: Denies any fever, chills, sweats, anorexia, CV: Denies chest pain, angina, palpitations, syncope, orthopnea, PND, peripheral edema, and claudication. Resp: Denies dyspnea at rest, dyspnea with exercise, cough, sputum, wheezing, coughing up blood, and pleurisy. GI: jaundice, and fecal incontinence.   Denies dysphagia or odynophagia. Derm: Denies rash, itching, dry skin, hives, moles, warts, or unhealing ulcers.  Psych: Denies depression, anxiety, memory loss, suicidal ideation, hallucinations, paranoia, and confusion. Heme: Denies bruising, bleeding, and enlarged lymph nodes.   Physical Exam: Vital signs in last 24 hours: Temp:  [98.3 F (36.8 C)-99.3 F (37.4 C)] 98.3 F (36.8 C) (07/19 1100) Pulse Rate:  [93-134] 104 (07/19 1100) Resp:  [15-27] 20 (07/19 1100) BP: (82-119)/(39-68) 99/55 mmHg (07/19  1100) SpO2:  [92 %-100 %] 99 % (07/19 1100) Weight:  [138 lb 7.2 oz (62.8 kg)] 138 lb 7.2 oz (62.8 kg) (07/19 0043) Last BM Date: 02/25/14 General:  Awake conversant. Appears to have a flat affect. Looks out the window when she talks to me. No acute distress. Head:  Normocephalic and atraumatic. Eyes:  Sclera clear, no icterus.   Conjunctiva pink. Ears:  Normal auditory acuity. Nose:  No deformity, discharge,  or lesions. Mouth:  No deformity or lesions, dentition normal. Neck:  Supple; no masses or thyromegaly. Lungs:  Clear throughout to auscultation.   No wheezes, crackles, or rhonchi. No acute distress. Heart:  Regular rate and rhythm; no murmurs, clicks, rubs,  or gallops. Abdomen:  Distended but not tight. Positive bowel sounds. Positive fluid wave  and shifting dullness. No obvious mass or organomegaly. Msk:  Symmetrical without gross deformities. Normal posture. Pulses:  Normal pulses noted. Extremities:  Without clubbing or edema. Neurologic:  Alert and  oriented x4;  poor eye contact  Skin:  Intact without significant lesions or rashes.  Intake/Output from previous day: 07/18 0701 - 07/19 0700 In: 848.8 [I.V.:448.8; IV Piggyback:400] Out: -  Intake/Output this shift: Total I/O In: 184.6 [I.V.:158.8; Blood:25.8] Out: 200 [Urine:200]  Lab Results:  Recent Labs  02/24/14 2105 02/25/14 0533 02/25/14 0735  WBC 10.2 7.8 7.8  HGB 10.1* 8.0* 7.7*  HCT 30.4* 23.3* 23.5*  PLT 212 164 167   BMET  Recent Labs  02/24/14 2105 02/25/14 0533  NA 139 139  K 3.0* 3.3*  CL 95* 98  CO2 29 28  GLUCOSE 104* 118*  BUN 39* 47*  CREATININE 1.03 1.15*  CALCIUM 8.9 8.2*   LFT  Recent Labs  02/25/14 0533  PROT 5.9*  ALBUMIN 2.1*  AST 50*  ALT 11  ALKPHOS 118*  BILITOT 2.5*   PT/INR  Recent Labs  02/24/14 2115  LABPROT 15.2  INR 1.20   Impression:  38 year old lady with known steatohepatitis/cirrhosis admitted with hemodynamically significant upper GI bleed in  the setting of known esophageal varices and portal gastropathy.  She is relatively stable in the ICU at this point in time. She is receiving one unit of packed blood cells. She is on IV PPI, antibiotic and octreotide therapy. She denies alcohol ingestion although she appears somewhat evasive when questioned.  Somewhat noncompliant as an outpatient.   Mildly elevated bilirubin and aminotransferases - as seen previously. Interesting AST/ALT ratio.    Recommendations:  As discussed with Dr.Gherghe, agree with one unit of packed blood cells.  Plan for elective intubation to protect airway for EGD with therapeutic intent later today.  Dr. Arnoldo Morale to come in and place a central line. Continue IV PPI, antibiotics and octreotide for now.  I have discussed Tressie Stalker, CRNA.  She has been noted to have a fairly impressive tachycardia at baseline as an outpatient. TSH normal. With or without concomitant beta blocker therapy this is relatively unusual.  Would still consider elective cardiology evaluation at some point in the near future.   I have discussed the risks, benefits, limitations, alternatives and imponderables have been reviewed with the patient. Reasoning behind elective intubation also reviewed in some detail.  Questions have been answered.  she is agreeable.       Notice:  This dictation was prepared with Dragon dictation along with smaller phrase technology. Any transcriptional errors that result from this process are unintentional and may not be corrected upon review.

## 2014-02-25 NOTE — Progress Notes (Signed)
Noticed that pt's SBP in the 70's. Paged mid-level; orders received and carrie out. Will continue to monitor.

## 2014-02-25 NOTE — Procedures (Signed)
Central Venous Catheter Insertion Procedure Note Wanda [name] [id number] June 24, 1976  Procedure: Insertion of Central Venous Catheter Indications: Assessment of intravascular volume, Drug and/or fluid administration and Frequent blood sampling  Procedure Details Consent: Risks of procedure as well as the alternatives and risks of each were explained to the (patient/caregiver).  Consent for procedure obtained. Time Out: Verified patient identification, verified procedure, site/side was marked, verified correct patient position, special equipment/implants available, medications/allergies/relevent history reviewed, required imaging and test results available.  Performed  Maximum sterile technique was used including antiseptics, cap, gloves, gown, hand hygiene, mask and sheet. Skin prep: Chlorhexidine; local anesthetic administered A antimicrobial bonded/coated triple lumen catheter was placed in the right femoral vein due to emergent situation using the Seldinger technique.  Evaluation Blood flow good Complications: No apparent complications Patient did tolerate procedure well.                                               Wanda [name] A 02/25/2014, 12:32 PM

## 2014-02-25 NOTE — Progress Notes (Signed)
Patient was electively intubated prior to undergoing an EGD for esophageal varices. Fentanyl 100 mcg and Versed 4 mg IV were used for preinduction. She was preoxygenated with 100% O2. She was intubated to the 22 cm Quintavia Rogstad using a 7.5 ET tube. She tolerated the procedure well. Chest x-ray confirms adequate positioning of the ET tube. Good breath sounds were heard bilaterally. Capnography confirmed placement as well. Sedation protocol started.

## 2014-02-25 NOTE — Progress Notes (Signed)
PROGRESS NOTE  CARELI LUZADER DSK:[id number] DOB: [birth year] DOA: 02/24/2014 PCP: No PCP Per Patient  HPI: Wanda [name] is a 38 y.o. female with a past medical history of nonalcoholic liver cirrhosis, portal hypertension with ascites, status post paracentesis in the past, who was in her usual state of health, when she started having bloody vomiting.   Assessment/Plan: Hematemesis:  - stable overnight in the ICU, borderline hypotensive but asymptomatic.  - awaiting GI input today  - keep strict NPO for possible EGD Liver cirrhosis with portal hypertension and ascites: She does have abdominal distention worse from before. She does have vague abdominal discomfort. There is a concern for SBP. We will give her ceftriaxone for now. Further management per GI.   - may need paracentesis once #1 more stable Sinus tachycardia: Heart rate has been elevated in the past. This elevation could be due to acute illness. Lactic acid level is normal.  - IVF Hypokalemia: this will be repleted intravenously.  AKI - due to hypotension, IVF, closely monitor for HRS, obtain urinalysis and urine sodium Microcytic anemia:  - drop overnight, has a dilutional component will all cells down, but will transfuse 1U today given concern for brisk upper bleed with hypotension.   Diet: NPO Fluids: NS DVT Prophylaxis: SCD  Code Status: Full Family Communication: d/w patient  Disposition Plan: inpatient  Consultants:  GI  Procedures:  None    Antibiotics Ceftriaxone 7/18 >>  Studies  Filed Vitals:   02/25/14 0500 02/25/14 0530 02/25/14 0605 02/25/14 0611  BP: 92/51 96/50 82/42  84/39  Pulse: 101 101 100 100  Temp:      TempSrc:      Resp: 26 22 23 22   Height:      Weight:      SpO2: 97% 96% 100% 100%    Intake/Output Summary (Last 24 hours) at 02/25/14 0738 Last data filed at 02/25/14 0600  Gross per 24 hour  Intake 848.75 ml  Output      0 ml  Net 848.75 ml   Filed Weights   02/25/14  0043  Weight: 62.8 kg (138 lb 7.2 oz)    Exam:  General:  NAD  Cardiovascular: RRR, tachycardic  Respiratory: good air movement, clear, no wheezing  Abdomen: distended, diffusely tender to palpation  MSK: no edema  Neuro: AxOx4  Data Reviewed: Basic Metabolic Panel:  Recent Labs Lab 02/24/14 2105 02/25/14 0533  NA 139 139  K 3.0* 3.3*  CL 95* 98  CO2 29 28  GLUCOSE 104* 118*  BUN 39* 47*  CREATININE 1.03 1.15*  CALCIUM 8.9 8.2*   Liver Function Tests:  Recent Labs Lab 02/24/14 2105 02/25/14 0533  AST 68* 50*  ALT 15 11  ALKPHOS 156* 118*  BILITOT 3.0* 2.5*  PROT 7.3 5.9*  ALBUMIN 2.7* 2.1*    Recent Labs Lab 02/24/14 2115  LIPASE 36    Recent Labs Lab 02/24/14 2115  AMMONIA 44   CBC:  Recent Labs Lab 02/24/14 2105 02/25/14 0533  WBC 10.2 7.8  NEUTROABS 7.3  --   HGB 10.1* 8.0*  HCT 30.4* 23.3*  MCV 102.4* 103.1*  PLT 212 164   Cardiac Enzymes:  Recent Labs Lab 02/24/14 2115  TROPONINI <0.30   Recent Results (from the past 240 hour(s))  CULTURE, BLOOD (ROUTINE X 2)     Status: None   Collection Time    02/24/14 11:11 PM      Result Value Ref Range Status   Specimen  Description RIGHT ANTECUBITAL   Final   Special Requests BOTTLES DRAWN AEROBIC ONLY 12CC   Final   Culture NO GROWTH 1 DAY   Final   Report Status PENDING   Incomplete  CULTURE, BLOOD (ROUTINE X 2)     Status: None   Collection Time    02/24/14 11:11 PM      Result Value Ref Range Status   Specimen Description LEFT ANTECUBITAL   Final   Special Requests BOTTLES DRAWN AEROBIC ONLY 8CC   Final   Culture NO GROWTH 1 DAY   Final   Report Status PENDING   Incomplete  MRSA PCR SCREENING     Status: None   Collection Time    02/25/14  1:13 AM      Result Value Ref Range Status   MRSA by PCR NEGATIVE  NEGATIVE Final   Comment:            The GeneXpert MRSA Assay (FDA     approved for NASAL specimens     only), is one component of a     comprehensive MRSA  colonization     surveillance program. It is not     intended to diagnose MRSA     infection nor to guide or     monitor treatment for     MRSA infections.     Studies: Dg Abd Acute W/chest  02/24/2014   CLINICAL DATA:  Abdominal pain, pressure  EXAM: ACUTE ABDOMEN SERIES (ABDOMEN 2 VIEW & CHEST 1 VIEW)  COMPARISON:  None.  FINDINGS: There is no evidence of dilated bowel loops or free intraperitoneal air. No radiopaque calculi or other significant radiographic abnormality is seen. Heart size and mediastinal contours are within normal limits. Both lungs are clear.  IMPRESSION: Negative abdominal radiographs.  No acute cardiopulmonary disease.   Electronically Signed   By: Kathreen Devoid   On: 02/24/2014 22:50    Scheduled Meds: . antiseptic oral rinse  15 mL Mouth Rinse q12n4p  . cefTRIAXone (ROCEPHIN)  IV  1 g Intravenous Q12H  . chlorhexidine  15 mL Mouth Rinse BID  . pantoprazole (PROTONIX) IV  40 mg Intravenous Q12H  . sodium chloride  3 mL Intravenous Q12H   Continuous Infusions: . sodium chloride 50 mL/hr at 02/25/14 0051  . octreotide (SANDOSTATIN) infusion 50 mcg/hr (02/25/14 0100)    Principal Problem:   Hematemesis/vomiting blood Active Problems:   Ascites   Sinus tachycardia   Cirrhosis   Hypokalemia   Time spent: 35  This note has been created with Surveyor, quantity. Any transcriptional errors are unintentional.   Marzetta Board, MD [facility] Pager [phone]. If 7 PM - 7 AM, please contact night-coverage at [email or web address], password [facility] 02/25/2014, 7:38 AM  LOS: 1 day

## 2014-02-26 ENCOUNTER — Encounter (HOSPITAL_COMMUNITY): Payer: Self-pay | Admitting: Internal Medicine

## 2014-02-26 DIAGNOSIS — R188 Other ascites: Secondary | ICD-10-CM

## 2014-02-26 LAB — CBC
HCT: 24.1 % — ABNORMAL LOW (ref 36.0–46.0)
HCT: 23.2 % — ABNORMAL LOW (ref 36.0–46.0)
HCT: 27.1 % — ABNORMAL LOW (ref 36.0–46.0)
HCT: 28 % — ABNORMAL LOW (ref 36.0–46.0)
HEMOGLOBIN: 9.1 g/dL — AB (ref 12.0–15.0)
Hemoglobin: 8 g/dL — ABNORMAL LOW (ref 12.0–15.0)
Hemoglobin: 7.6 g/dL — ABNORMAL LOW (ref 12.0–15.0)
Hemoglobin: 9.3 g/dL — ABNORMAL LOW (ref 12.0–15.0)
MCH: 33.5 pg (ref 26.0–34.0)
MCH: 33.2 pg (ref 26.0–34.0)
MCH: 32.9 pg (ref 26.0–34.0)
MCH: 32.6 pg (ref 26.0–34.0)
MCHC: 33.2 g/dL (ref 30.0–36.0)
MCHC: 32.8 g/dL (ref 30.0–36.0)
MCHC: 33.6 g/dL (ref 30.0–36.0)
MCHC: 33.2 g/dL (ref 30.0–36.0)
MCV: 100.8 fL — AB (ref 78.0–100.0)
MCV: 101.3 fL — AB (ref 78.0–100.0)
MCV: 97.8 fL (ref 78.0–100.0)
MCV: 98.2 fL (ref 78.0–100.0)
PLATELETS: 150 10*3/uL (ref 150–400)
Platelets: 155 10*3/uL (ref 150–400)
Platelets: 148 10*3/uL — ABNORMAL LOW (ref 150–400)
Platelets: 166 10*3/uL (ref 150–400)
RBC: 2.39 MIL/uL — ABNORMAL LOW (ref 3.87–5.11)
RBC: 2.29 MIL/uL — AB (ref 3.87–5.11)
RBC: 2.77 MIL/uL — ABNORMAL LOW (ref 3.87–5.11)
RBC: 2.85 MIL/uL — ABNORMAL LOW (ref 3.87–5.11)
RDW: 19.5 % — AB (ref 11.5–15.5)
RDW: 19.5 % — ABNORMAL HIGH (ref 11.5–15.5)
RDW: 20.9 % — AB (ref 11.5–15.5)
RDW: 21.8 % — ABNORMAL HIGH (ref 11.5–15.5)
WBC: 7 10*3/uL (ref 4.0–10.5)
WBC: 6.5 10*3/uL (ref 4.0–10.5)
WBC: 7.4 10*3/uL (ref 4.0–10.5)
WBC: 7.7 10*3/uL (ref 4.0–10.5)

## 2014-02-26 LAB — COMPREHENSIVE METABOLIC PANEL
ALT: 17 U/L (ref 0–35)
AST: 169 U/L — ABNORMAL HIGH (ref 0–37)
Albumin: 2 g/dL — ABNORMAL LOW (ref 3.5–5.2)
Alkaline Phosphatase: 106 U/L (ref 39–117)
Anion gap: 13 (ref 5–15)
BILIRUBIN TOTAL: 2.7 mg/dL — AB (ref 0.3–1.2)
BUN: 46 mg/dL — ABNORMAL HIGH (ref 6–23)
CHLORIDE: 104 meq/L (ref 96–112)
CO2: 25 meq/L (ref 19–32)
CREATININE: 1.49 mg/dL — AB (ref 0.50–1.10)
Calcium: 7.9 mg/dL — ABNORMAL LOW (ref 8.4–10.5)
GFR, EST AFRICAN AMERICAN: 50 mL/min — AB (ref >90)
GFR, EST NON AFRICAN AMERICAN: 43 mL/min — AB (ref >90)
GLUCOSE: 94 mg/dL (ref 70–99)
Potassium: 3.1 mEq/L — ABNORMAL LOW (ref 3.7–5.3)
Sodium: 142 mEq/L (ref 137–147)
Total Protein: 5.8 g/dL — ABNORMAL LOW (ref 6.0–8.3)

## 2014-02-26 LAB — MAGNESIUM: Magnesium: 1.5 mg/dL (ref 1.5–2.5)

## 2014-02-26 LAB — PROTIME-INR
INR: 1.22 (ref 0.00–1.49)
Prothrombin Time: 15.4 seconds — ABNORMAL HIGH (ref 11.6–15.2)

## 2014-02-26 LAB — PREPARE RBC (CROSSMATCH)

## 2014-02-26 LAB — PHOSPHORUS: Phosphorus: 3.4 mg/dL (ref 2.3–4.6)

## 2014-02-26 MED ORDER — POTASSIUM CHLORIDE 10 MEQ/50ML IV SOLN: 10.0000 meq | INTRAVENOUS | Status: DC

## 2014-02-26 MED ORDER — FENTANYL CITRATE 0.05 MG/ML IJ SOLN
INTRAMUSCULAR | Status: AC
  Filled 2014-02-26: qty 50

## 2014-02-26 MED ORDER — POTASSIUM CHLORIDE 10 MEQ/100ML IV SOLN
10.0000 meq | INTRAVENOUS | Status: AC
  Administered 2014-02-26 (×5): 10 meq via INTRAVENOUS
  Filled 2014-02-26 (×5): qty 100

## 2014-02-26 NOTE — Telephone Encounter (Signed)
REVIEWED.  

## 2014-02-26 NOTE — Progress Notes (Signed)
INITIAL NUTRITION ASSESSMENT  DOCUMENTATION CODES Per approved criteria  -Not Applicable   INTERVENTION: Follow for nutrition plan of care  NUTRITION DIAGNOSIS: Inadequate oral intake related to variceal bleed as evidenced by NPO status and intubated for airway protection.   Goal: Pt to meet >/= 90% of their estimated nutrition needs    Monitor:  Respiratory status, Nutrition support measures, labs and wt trends   Reason for Assessment: mechanical intubation  38 y.o. female  Admitting Dx: Hematemesis/vomiting blood  ASSESSMENT: Patient is currently intubated (7/19) on ventilator support for airway protection.  ETT is in good position per radiology. Pt has cirrhosis of the liver (nonalcoholic), portal hypertension. She presented with variceal bleed. She is to receive 1 unit PRBC's today. Mag and phos WNL. Pt undergoing weaning trials.  MV: 5.4 L/min Temp (24hrs), Avg:97.9 F (36.6 C), Min:97.5 F (36.4 C), Max:98.5 F (36.9 C)  Height: Ht Readings from Last 1 Encounters:  02/26/14 5\' 2"  (1.575 m)    Weight: Wt Readings from Last 1 Encounters:  02/26/14 152 lb 8.9 oz (69.2 kg)    Ideal Body Weight: 110#  % Ideal Body Weight: 138%  Wt Readings from Last 10 Encounters:  02/26/14 152 lb 8.9 oz (69.2 kg)  02/26/14 152 lb 8.9 oz (69.2 kg)  12/19/13 145 lb (65.772 kg)  12/19/13 145 lb (65.772 kg)  12/05/13 134 lb 9.6 oz (61.054 kg)  11/23/13 160 lb (72.576 kg)  11/17/13 168 lb 14 oz (76.6 kg)  11/17/13 168 lb 14 oz (76.6 kg)  10/12/13 172 lb (78.019 kg)  09/11/13 177 lb 11.2 oz (80.604 kg)    Usual Body Weight: 160-170#  % Usual Body Weight: 96%  BMI:  Body mass index is 27.9 kg/(m^2).overweight  Estimated Nutritional Needs: Kcal: 1339 Protein: 104-117 gr Fluid: per MD goals  Skin: intact  Diet Order: NPO  EDUCATION NEEDS: -No education needs identified at this time   Intake/Output Summary (Last 24 hours) at 02/26/14 1439 Last data filed at  02/26/14 1217  Gross per 24 hour  Intake 986.64 ml  Output      0 ml  Net 986.64 ml    Last BM: 02/25/14 loose, brown stool, abdominal distention   Labs:   Recent Labs Lab 02/24/14 2105 02/25/14 0533 02/26/14 0500  NA 139 139 142  K 3.0* 3.3* 3.1*  CL 95* 98 104  CO2 29 28 25   BUN 39* 47* 46*  CREATININE 1.03 1.15* 1.49*  CALCIUM 8.9 8.2* 7.9*  MG  --   --  1.5  PHOS  --   --  3.4  GLUCOSE 104* 118* 94    CBG (last 3)  No results found for this basename: GLUCAP,  in the last 72 hours  Scheduled Meds: . antiseptic oral rinse  15 mL Mouth Rinse QID  . cefTRIAXone (ROCEPHIN)  IV  1 g Intravenous Q12H  . chlorhexidine  15 mL Mouth Rinse BID  . pantoprazole (PROTONIX) IV  40 mg Intravenous Q12H  . potassium chloride  10 mEq Intravenous Q1 Hr x 5  . sodium chloride  10-40 mL Intracatheter Q12H  . sodium chloride  3 mL Intravenous Q12H    Continuous Infusions: . fentaNYL infusion INTRAVENOUS 10 mcg/hr (02/26/14 1217)  . midazolam (VERSED) infusion Stopped (02/26/14 0749)  . octreotide (SANDOSTATIN) infusion 50 mcg/hr (02/26/14 0800)    Past Medical History  Diagnosis Date  . GERD (gastroesophageal reflux disease)   . Liver disease 10/05/13    Liver bx  11/23/13 (delayed initially due to patient refusal). c/w steatohepatitis  . Acute renal failure 09/2013    Pre-renal- resolved  . Folate deficiency 09/2013  . Anasarca 10/10/2013    Past Surgical History  Procedure Laterality Date  . None    . Paracentesis  Feb 2015    1180 fluid, negative fluid analysis.   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 Paracentesis  10/2013  . Colonoscopy N/A 12/19/2013    Procedure: COLONOSCOPY;  Surgeon: Danie Binder, MD;  Location: [facility];  Service: Endoscopy;  Laterality: N/A;  9:30    Colman Cater MS,RD,CSG,LDN Office: [phone] Pager:  [phone]

## 2014-02-26 NOTE — Progress Notes (Signed)
REVIEWED.  

## 2014-02-26 NOTE — Progress Notes (Signed)
Subjective: No overt GI bleeding per nursing staff. Patient intubated. Respiratory at bedside to begin weaning trials.   Objective: Vital signs in last 24 hours: Temp:  [97.7 F (36.5 C)-98.6 F (37 C)] 98.1 F (36.7 C) (07/20 0400) Pulse Rate:  [71-104] 79 (07/20 0700) Resp:  [12-24] 12 (07/20 0700) BP: (72-116)/(37-85) 82/39 mmHg (07/20 0700) SpO2:  [91 %-100 %] 100 % (07/20 0700) FiO2 (%):  [40 %] 40 % (07/20 0801) Weight:  [152 lb 8.9 oz (69.2 kg)] 152 lb 8.9 oz (69.2 kg) (07/20 0500) Last BM Date: 02/25/14 General:   Alert and oriented to person, intubated, follows command to squeeze hands.  Head:  Normocephalic and atraumatic. Eyes:  +scleral icterus Abdomen:  Bowel sounds present, distended but soft, full, fluid wave. No TTP Extremities:  Without  edema. Neurologic:  Alert and oriented to person .  Intake/Output from previous day: 07/19 0701 - 07/20 0700 In: 1377.5 [I.V.:951.7; Blood:325.8; IV Piggyback:100] Out: 200 [Urine:200] Intake/Output this shift:    Lab Results:  Recent Labs  02/25/14 2205 02/26/14 0500 02/26/14 0734  WBC 6.5 7.0 6.5  HGB 8.1* 8.0* 7.6*  HCT 24.6* 24.1* 23.2*  PLT 156 155 150   BMET  Recent Labs  02/24/14 2105 02/25/14 0533 02/26/14 0500  NA 139 139 142  K 3.0* 3.3* 3.1*  CL 95* 98 104  CO2 29 28 25   GLUCOSE 104* 118* 94  BUN 39* 47* 46*  CREATININE 1.03 1.15* 1.49*  CALCIUM 8.9 8.2* 7.9*   LFT  Recent Labs  02/24/14 2105 02/25/14 0533 02/26/14 0500  PROT 7.3 5.9* 5.8*  ALBUMIN 2.7* 2.1* 2.0*  AST 68* 50* 169*  ALT 15 11 17   ALKPHOS 156* 118* 106  BILITOT 3.0* 2.5* 2.7*   PT/INR  Recent Labs  02/24/14 2115 02/26/14 0500  LABPROT 15.2 15.4*  INR 1.20 1.22     Studies/Results: Dg Chest Port 1 View  02/25/2014   CLINICAL DATA:  Status post intubation.  EXAM: PORTABLE CHEST - 1 VIEW  COMPARISON:  Chest x-ray earlier today.  FINDINGS: Endotracheal tube is been placed with the tip approximately 2.5  cm above the carina and in good position. Lungs show mild bibasilar atelectasis. There is no evidence of pulmonary edema, consolidation, pneumothorax, nodule or pleural fluid. The heart size is stable and within normal limits.  IMPRESSION: Appropriate positioning of endotracheal tube. Bibasilar atelectasis without acute pulmonary process identified.   Electronically Signed   By: Aletta Edouard M.D.   On: 02/25/2014 13:26   Dg Abd Acute W/chest  02/24/2014   CLINICAL DATA:  Abdominal pain, pressure  EXAM: ACUTE ABDOMEN SERIES (ABDOMEN 2 VIEW & CHEST 1 VIEW)  COMPARISON:  None.  FINDINGS: There is no evidence of dilated bowel loops or free intraperitoneal air. No radiopaque calculi or other significant radiographic abnormality is seen. Heart size and mediastinal contours are within normal limits. Both lungs are clear.  IMPRESSION: Negative abdominal radiographs.  No acute cardiopulmonary disease.   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 Remains intubated for airway protection. Hgb drifting and 1 unit PRBCs ordered this morning. No overt signs of GI bleeding.   Persistent tachycardia: elective cardiology evaluation in future  Plan: Continue octreotide PPI IV BID Continue Rocephin Serial H/H Close monitoring of renal function Monitor for overt GI bleeding Remain  intubated for now    Orvil Feil, ANP-BC [facility]    LOS: 2 days    02/26/2014, 8:19 AM

## 2014-02-26 NOTE — Progress Notes (Signed)
PROGRESS NOTE  Wanda [name] NLZ:[id number] DOB: 07-Sep-1975 DOA: 02/24/2014 PCP: No PCP Per Patient  HPI: Wanda [name] is a 38 y.o. female with a past medical history of nonalcoholic liver cirrhosis, portal hypertension with ascites, status post paracentesis in the past, who was in her usual state of health, when she started having bloody vomiting.   Subjective/Past 24 h - intubates yesterday for airway protection, s/p EGD; to be re-evaluated by GI again  - opens eyes this morning, follows commands. ETT tube in place.  Assessment/Plan: Hematemesis:  - stable overnight in the ICU, borderline hypotensive. - Hb trending down, transfuse another unit.  Liver cirrhosis with portal hypertension and ascites: She does have abdominal distention worse from before. She does have vague abdominal discomfort. - Continue Ceftriaxone also for #1 - may need paracentesis once #1 more stable Sinus tachycardia: Heart rate has been elevated in the past. This elevation could be due to acute illness. Lactic acid level is normal.  - IVF Hypokalemia: this will be repleted intravenously.  AKI - due to hypotension, IVF - renal function worse, I will ask GI to see if albumin/midrodrine would be of consideration today Microcytic anemia:  - continue to transfuse another unit today   Diet: NPO Fluids: NS DVT Prophylaxis: SCD  Code Status: Full Family Communication: none this morning Disposition Plan: inpatient  Consultants:  GI  Procedures:  None    Antibiotics Ceftriaxone 7/18 >>  Studies  Filed Vitals:   02/26/14 0615 02/26/14 0630 02/26/14 0700 02/26/14 0801  BP: 89/47 90/47 82/39    Pulse: 90 89 79   Temp:      TempSrc:      Resp: 12 12 12    Height:    5\' 2"  (1.575 m)  Weight:      SpO2: 100% 100% 100%     Intake/Output Summary (Last 24 hours) at 02/26/14 0924 Last data filed at 02/26/14 0800  Gross per 24 hour  Intake 1268.75 ml  Output      0 ml  Net 1268.75 ml   Filed  Weights   02/25/14 0043 02/26/14 0500  Weight: 62.8 kg (138 lb 7.2 oz) 69.2 kg (152 lb 8.9 oz)   Exam:  General:  Drowsy, intubated  Cardiovascular: RRR, tachycardic  Respiratory: bilateral air movement, clear, no wheezing  Abdomen: distended, diffusely tender to palpation  MSK: no edema  Data Reviewed: Basic Metabolic Panel:  Recent Labs Lab 02/24/14 2105 02/25/14 0533 02/26/14 0500  NA 139 139 142  K 3.0* 3.3* 3.1*  CL 95* 98 104  CO2 29 28 25   GLUCOSE 104* 118* 94  BUN 39* 47* 46*  CREATININE 1.03 1.15* 1.49*  CALCIUM 8.9 8.2* 7.9*  MG  --   --  1.5  PHOS  --   --  3.4   Liver Function Tests:  Recent Labs Lab 02/24/14 2105 02/25/14 0533 02/26/14 0500  AST 68* 50* 169*  ALT 15 11 17   ALKPHOS 156* 118* 106  BILITOT 3.0* 2.5* 2.7*  PROT 7.3 5.9* 5.8*  ALBUMIN 2.7* 2.1* 2.0*    Recent Labs Lab 02/24/14 2115  LIPASE 36    Recent Labs Lab 02/24/14 2115  AMMONIA 44   CBC:  Recent Labs Lab 02/24/14 2105  02/25/14 0735 02/25/14 1544 02/25/14 2205 02/26/14 0500 02/26/14 0734  WBC 10.2  < > 7.8 6.8 6.5 7.0 6.5  NEUTROABS 7.3  --   --   --   --   --   --  HGB 10.1*  < > 7.7* 8.3* 8.1* 8.0* 7.6*  HCT 30.4*  < > 23.5* 25.0* 24.6* 24.1* 23.2*  MCV 102.4*  < > 103.5* 101.2* 100.8* 100.8* 101.3*  PLT 212  < > 167 154 156 155 150  < > = values in this interval not displayed. Cardiac Enzymes:  Recent Labs Lab 02/24/14 2115  TROPONINI <0.30   Recent Results (from the past 240 hour(s))  CULTURE, BLOOD (ROUTINE X 2)     Status: None   Collection Time    02/24/14 11:11 PM      Result Value Ref Range Status   Specimen Description RIGHT ANTECUBITAL   Final   Special Requests BOTTLES DRAWN AEROBIC ONLY 12CC   Final   Culture NO GROWTH 1 DAY   Final   Report Status PENDING   Incomplete  CULTURE, BLOOD (ROUTINE X 2)     Status: None   Collection Time    02/24/14 11:11 PM      Result Value Ref Range Status   Specimen Description LEFT  ANTECUBITAL   Final   Special Requests BOTTLES DRAWN AEROBIC ONLY 8CC   Final   Culture NO GROWTH 1 DAY   Final   Report Status PENDING   Incomplete  MRSA PCR SCREENING     Status: None   Collection Time    02/25/14  1:13 AM      Result Value Ref Range Status   MRSA by PCR NEGATIVE  NEGATIVE Final   Comment:            The GeneXpert MRSA Assay (FDA     approved for NASAL specimens     only), is one component of a     comprehensive MRSA colonization     surveillance program. It is not     intended to diagnose MRSA     infection nor to guide or     monitor treatment for     MRSA infections.     Studies: Dg Chest Port 1 View  02/25/2014   CLINICAL DATA:  Status post intubation.  EXAM: PORTABLE CHEST - 1 VIEW  COMPARISON:  Chest x-ray earlier today.  FINDINGS: Endotracheal tube is been placed with the tip approximately 2.5 cm above the carina and in good position. Lungs show mild bibasilar atelectasis. There is no evidence of pulmonary edema, consolidation, pneumothorax, nodule or pleural fluid. The heart size is stable and within normal limits.  IMPRESSION: Appropriate positioning of endotracheal tube. Bibasilar atelectasis without acute pulmonary process identified.   Electronically Signed   By: Aletta Edouard M.D.   On: 02/25/2014 13:26    Scheduled Meds: . antiseptic oral rinse  15 mL Mouth Rinse QID  . cefTRIAXone (ROCEPHIN)  IV  1 g Intravenous Q12H  . chlorhexidine  15 mL Mouth Rinse BID  . pantoprazole (PROTONIX) IV  40 mg Intravenous Q12H  . sodium chloride  10-40 mL Intracatheter Q12H  . sodium chloride  3 mL Intravenous Q12H   Continuous Infusions: . fentaNYL infusion INTRAVENOUS Stopped (02/26/14 0749)  . midazolam (VERSED) infusion Stopped (02/26/14 0749)  . octreotide (SANDOSTATIN) infusion 50 mcg/hr (02/26/14 0800)    Principal Problem:   Hematemesis/vomiting blood Active Problems:   Ascites   Sinus tachycardia   Cirrhosis   Hypokalemia  Time spent:  25  This note has been created with Surveyor, quantity. Any transcriptional errors are unintentional.   Marzetta Board, MD [facility] Pager [phone]. If  7 PM - 7 AM, please contact night-coverage at [email or web address], password [facility] 02/26/2014, 9:24 AM  LOS: 2 days

## 2014-02-27 LAB — BLOOD GAS, ARTERIAL
Acid-base deficit: 3.7 mmol/L — ABNORMAL HIGH (ref 0.0–2.0)
Bicarbonate: 20.8 mEq/L (ref 20.0–24.0)
DRAWN BY: 234301
FIO2: 40 %
Mode: POSITIVE
O2 SAT: 97.6 %
PEEP/CPAP: 5 cmH2O
PH ART: 7.359 (ref 7.350–7.450)
PO2 ART: 104 mmHg — AB (ref 80.0–100.0)
PRESSURE SUPPORT: 5 cmH2O
Patient temperature: 37
TCO2: 19.6 mmol/L (ref 0–100)
pCO2 arterial: 37.8 mmHg (ref 35.0–45.0)

## 2014-02-27 LAB — URINE MICROSCOPIC-ADD ON

## 2014-02-27 LAB — CBC
HCT: 28.5 % — ABNORMAL LOW (ref 36.0–46.0)
HCT: 28.7 % — ABNORMAL LOW (ref 36.0–46.0)
HEMATOCRIT: 29.9 % — AB (ref 36.0–46.0)
HEMOGLOBIN: 9.5 g/dL — AB (ref 12.0–15.0)
Hemoglobin: 9.3 g/dL — ABNORMAL LOW (ref 12.0–15.0)
Hemoglobin: 9.8 g/dL — ABNORMAL LOW (ref 12.0–15.0)
MCH: 33 pg (ref 26.0–34.0)
MCH: 32.1 pg (ref 26.0–34.0)
MCH: 32.8 pg (ref 26.0–34.0)
MCHC: 33.3 g/dL (ref 30.0–36.0)
MCHC: 32.4 g/dL (ref 30.0–36.0)
MCHC: 32.8 g/dL (ref 30.0–36.0)
MCV: 99 fL (ref 78.0–100.0)
MCV: 99 fL (ref 78.0–100.0)
MCV: 100 fL (ref 78.0–100.0)
Platelets: 153 10*3/uL (ref 150–400)
Platelets: 160 10*3/uL (ref 150–400)
Platelets: ADEQUATE 10*3/uL (ref 150–400)
RBC: 2.88 MIL/uL — AB (ref 3.87–5.11)
RBC: 2.9 MIL/uL — AB (ref 3.87–5.11)
RBC: 2.99 MIL/uL — ABNORMAL LOW (ref 3.87–5.11)
RDW: 21.9 % — ABNORMAL HIGH (ref 11.5–15.5)
RDW: 21.9 % — ABNORMAL HIGH (ref 11.5–15.5)
RDW: 21.7 % — ABNORMAL HIGH (ref 11.5–15.5)
WBC: 8.1 10*3/uL (ref 4.0–10.5)
WBC: 7.4 10*3/uL (ref 4.0–10.5)
WBC: 9 10*3/uL (ref 4.0–10.5)

## 2014-02-27 LAB — TYPE AND SCREEN
ABO/RH(D): A POS
Antibody Screen: NEGATIVE
Unit division: 0
Unit division: 0

## 2014-02-27 LAB — COMPREHENSIVE METABOLIC PANEL
ALK PHOS: 102 U/L (ref 39–117)
ALT: 16 U/L (ref 0–35)
AST: 121 U/L — ABNORMAL HIGH (ref 0–37)
Albumin: 2.1 g/dL — ABNORMAL LOW (ref 3.5–5.2)
Anion gap: 14 (ref 5–15)
BUN: 46 mg/dL — AB (ref 6–23)
CALCIUM: 8.1 mg/dL — AB (ref 8.4–10.5)
CO2: 22 meq/L (ref 19–32)
Chloride: 103 mEq/L (ref 96–112)
Creatinine, Ser: 1.95 mg/dL — ABNORMAL HIGH (ref 0.50–1.10)
GFR calc Af Amer: 36 mL/min — ABNORMAL LOW (ref >90)
GFR, EST NON AFRICAN AMERICAN: 31 mL/min — AB (ref >90)
GLUCOSE: 99 mg/dL (ref 70–99)
Potassium: 3.6 mEq/L — ABNORMAL LOW (ref 3.7–5.3)
SODIUM: 139 meq/L (ref 137–147)
Total Bilirubin: 3.5 mg/dL — ABNORMAL HIGH (ref 0.3–1.2)
Total Protein: 6 g/dL (ref 6.0–8.3)

## 2014-02-27 LAB — URINALYSIS, ROUTINE W REFLEX MICROSCOPIC
GLUCOSE, UA: NEGATIVE mg/dL
Ketones, ur: NEGATIVE mg/dL
Nitrite: NEGATIVE
Specific Gravity, Urine: 1.015 (ref 1.005–1.030)
Urobilinogen, UA: 0.2 mg/dL (ref 0.0–1.0)
pH: 5 (ref 5.0–8.0)

## 2014-02-27 LAB — PROTIME-INR
INR: 1.22 (ref 0.00–1.49)
Prothrombin Time: 15.4 seconds — ABNORMAL HIGH (ref 11.6–15.2)

## 2014-02-27 LAB — SODIUM, URINE, RANDOM: Sodium, Ur: 32 mEq/L

## 2014-02-27 LAB — PHOSPHORUS: Phosphorus: 3.2 mg/dL (ref 2.3–4.6)

## 2014-02-27 LAB — MAGNESIUM: Magnesium: 1.6 mg/dL (ref 1.5–2.5)

## 2014-02-27 MED ORDER — SODIUM CHLORIDE 0.9 % IV SOLN
INTRAVENOUS | Status: DC
  Administered 2014-02-27 – 2014-03-01 (×3): via INTRAVENOUS

## 2014-02-27 NOTE — Progress Notes (Signed)
    Subjective: Denies abdominal pain. Remains intubated. Weaning protocol per respiratory. Hope for extubation later today. No overt signs of GI bleeding.   Objective: Vital signs in last 24 hours: Temp:  [97.1 F (36.2 C)-98.5 F (36.9 C)] 97.7 F (36.5 C) (07/21 0400) Pulse Rate:  [68-105] 74 (07/21 0600) Resp:  [11-23] 12 (07/21 0645) BP: (70-104)/(33-63) 86/45 mmHg (07/21 0645) SpO2:  [83 %-100 %] 83 % (07/21 0600) FiO2 (%):  [40 %] 40 % (07/21 0820) Weight:  [138 lb 14.2 oz (63 kg)] 138 lb 14.2 oz (63 kg) (07/21 0500) Last BM Date: 02/25/14 General:   Alert and oriented, ventilator support, nods head in response to questions. Jaundiced.  Abdomen:  Bowel sounds present, distended with tense ascites, no rebound or guarding. Extremities:  Without  edema. Neurologic:  Alert and  oriented to person Psych:  Flat affect.   Intake/Output from previous day: 07/20 0701 - 07/21 0700 In: 1109.9 [I.V.:620.8; Blood:139.2; IV Piggyback:350] Out: 350 [Urine:350] Intake/Output this shift:    Lab Results:  Recent Labs  02/26/14 2017 02/27/14 0303 02/27/14 0716  WBC 7.7 8.1 7.4  HGB 9.3* 9.5* 9.3*  HCT 28.0* 28.5* 28.7*  PLT 166 153 160   BMET  Recent Labs  02/25/14 0533 02/26/14 0500 02/27/14 0303  NA 139 142 139  K 3.3* 3.1* 3.6*  CL 98 104 103  CO2 28 25 22   GLUCOSE 118* 94 99  BUN 47* 46* 46*  CREATININE 1.15* 1.49* 1.95*  CALCIUM 8.2* 7.9* 8.1*   LFT  Recent Labs  02/25/14 0533 02/26/14 0500 02/27/14 0303  PROT 5.9* 5.8* 6.0  ALBUMIN 2.1* 2.0* 2.1*  AST 50* 169* 121*  ALT 11 17 16   ALKPHOS 118* 106 102  BILITOT 2.5* 2.7* 3.5*   PT/INR  Recent Labs  02/26/14 0500 02/27/14 0303  LABPROT 15.4* 15.4*  INR 1.22 1.22     Studies/Results: Dg Chest Port 1 View  02/25/2014   CLINICAL DATA:  Status post intubation.  EXAM: PORTABLE CHEST - 1 VIEW  COMPARISON:  Chest x-ray earlier today.  FINDINGS: Endotracheal tube is been placed with the tip  approximately 2.5 cm above the carina and in good position. Lungs show mild bibasilar atelectasis. There is no evidence of pulmonary edema, consolidation, pneumothorax, nodule or pleural fluid. The heart size is stable and within normal limits.  IMPRESSION: Appropriate positioning of endotracheal tube. Bibasilar atelectasis without acute pulmonary process identified.   Electronically Signed   By: Aletta Edouard M.D.   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 Remains intubated for airway protection but weaning protocols in place. Hopeful extubation later today. Without any further signs of overt GI bleeding. Hgb stable.  Ascites: paracentesis with fluid analysis once extubated.  AKI: nephrology consulted.   Persistent tachycardia: elective cardiology evaluation in future  Plan: Octreotide X 72 hours PPI IV BID Continue Rocephin Serial H/H Nephrology to follow for renal function Weaning protocol for extubation Likely paracentesis on 7/22 after extubated  Orvil Feil, ANP-BC [facility]    LOS: 3 days    02/27/2014, 8:33 AM   Attending note:  Patient seen and examined this morning. Hopefully, we'll be extubated later today. Agree with assessment and recommendations as outlined above.

## 2014-02-27 NOTE — Progress Notes (Signed)
PROGRESS NOTE  Wanda [name] LPF:[id number] DOB: [birth year] DOA: 02/24/2014 PCP: No PCP Per Patient  HPI: Wanda [name] is a 38 y.o. female with a past medical history of nonalcoholic liver cirrhosis, portal hypertension with ascites, status post paracentesis in the past, who was in her usual state of health, when she started having bloody vomiting.   Subjective/Past 24 h - intubates 7/19 for airway protection, EGD, extubated 7/21 - doing better this morning after extubation  Assessment/Plan: Hematemesis:  - s/p EGD on 7/19, note from Dr. Felipe Drone to follow regarding findings - resolved after EGD - patient intubated before EGD for airway protection, extubated this morning, doing well.  Liver cirrhosis with portal hypertension and ascites: She does have abdominal distention worse from before. She does have vague abdominal discomfort. - Continue Ceftriaxone also for #1 - may need paracentesis once #1 more stable Sinus tachycardia: Heart rate has been elevated in the past. This elevation could be due to acute illness. Lactic acid level is normal.  - IVF Hypokalemia: this will be repleted intravenously.  AKI - due to hypotension, IVF - renal function worse, nephrology consult today - increase fluid rate Microcytic anemia:  - stable now   Diet: NPO, advance per GI Fluids: NS DVT Prophylaxis: SCD  Code Status: Full Family Communication: none this morning Disposition Plan: inpatient  Consultants:  GI  Procedures:  None    Antibiotics Ceftriaxone 7/18 >>  Studies  Filed Vitals:   02/27/14 0600 02/27/14 0615 02/27/14 0630 02/27/14 0645  BP: 84/45 79/47 104/61 86/45  Pulse: 74     Temp:      TempSrc:      Resp: 12 12 16 12   Height:      Weight:      SpO2: 83%       Intake/Output Summary (Last 24 hours) at 02/27/14 0731 Last data filed at 02/27/14 0615  Gross per 24 hour  Intake 1109.92 ml  Output    350 ml  Net 759.92 ml   Filed Weights   02/25/14 0043  02/26/14 0500 02/27/14 0500  Weight: 62.8 kg (138 lb 7.2 oz) 69.2 kg (152 lb 8.9 oz) 63 kg (138 lb 14.2 oz)   Exam:  General:  NAD  Cardiovascular: RRR, tachycardic  Respiratory: bilateral air movement, clear, no wheezing  Abdomen: distended, diffusely tender to palpation  MSK: trace edema  Data Reviewed: Basic Metabolic Panel:  Recent Labs Lab 02/24/14 2105 02/25/14 0533 02/26/14 0500 02/27/14 0303  NA 139 139 142 139  K 3.0* 3.3* 3.1* 3.6*  CL 95* 98 104 103  CO2 29 28 25 22   GLUCOSE 104* 118* 94 99  BUN 39* 47* 46* 46*  CREATININE 1.03 1.15* 1.49* 1.95*  CALCIUM 8.9 8.2* 7.9* 8.1*  MG  --   --  1.5 1.6  PHOS  --   --  3.4 3.2   Liver Function Tests:  Recent Labs Lab 02/24/14 2105 02/25/14 0533 02/26/14 0500 02/27/14 0303  AST 68* 50* 169* 121*  ALT 15 11 17 16   ALKPHOS 156* 118* 106 102  BILITOT 3.0* 2.5* 2.7* 3.5*  PROT 7.3 5.9* 5.8* 6.0  ALBUMIN 2.7* 2.1* 2.0* 2.1*    Recent Labs Lab 02/24/14 2115  LIPASE 36    Recent Labs Lab 02/24/14 2115  AMMONIA 44   CBC:  Recent Labs Lab 02/24/14 2105  02/26/14 0500 02/26/14 0734 02/26/14 1409 02/26/14 2017 02/27/14 0303  WBC 10.2  < > 7.0 6.5 7.4 7.7  8.1  NEUTROABS 7.3  --   --   --   --   --   --   HGB 10.1*  < > 8.0* 7.6* 9.1* 9.3* 9.5*  HCT 30.4*  < > 24.1* 23.2* 27.1* 28.0* 28.5*  MCV 102.4*  < > 100.8* 101.3* 97.8 98.2 99.0  PLT 212  < > 155 150 148* 166 153  < > = values in this interval not displayed. Cardiac Enzymes:  Recent Labs Lab 02/24/14 2115  TROPONINI <0.30   Recent Results (from the past 240 hour(s))  CULTURE, BLOOD (ROUTINE X 2)     Status: None   Collection Time    02/24/14 11:11 PM      Result Value Ref Range Status   Specimen Description RIGHT ANTECUBITAL   Final   Special Requests BOTTLES DRAWN AEROBIC ONLY 12CC   Final   Culture NO GROWTH 2 DAYS   Final   Report Status PENDING   Incomplete  CULTURE, BLOOD (ROUTINE X 2)     Status: None   Collection Time     02/24/14 11:11 PM      Result Value Ref Range Status   Specimen Description LEFT ANTECUBITAL   Final   Special Requests BOTTLES DRAWN AEROBIC ONLY 8CC   Final   Culture NO GROWTH 2 DAYS   Final   Report Status PENDING   Incomplete  MRSA PCR SCREENING     Status: None   Collection Time    02/25/14  1:13 AM      Result Value Ref Range Status   MRSA by PCR NEGATIVE  NEGATIVE Final   Comment:            The GeneXpert MRSA Assay (FDA     approved for NASAL specimens     only), is one component of a     comprehensive MRSA colonization     surveillance program. It is not     intended to diagnose MRSA     infection nor to guide or     monitor treatment for     MRSA infections.     Studies: No results found.  Scheduled Meds: . antiseptic oral rinse  15 mL Mouth Rinse QID  . cefTRIAXone (ROCEPHIN)  IV  1 g Intravenous Q12H  . chlorhexidine  15 mL Mouth Rinse BID  . pantoprazole (PROTONIX) IV  40 mg Intravenous Q12H  . sodium chloride  10-40 mL Intracatheter Q12H  . sodium chloride  3 mL Intravenous Q12H   Continuous Infusions: . fentaNYL infusion INTRAVENOUS 10 mcg/hr (02/27/14 0615)  . midazolam (VERSED) infusion 1 mg/hr (02/27/14 0115)  . octreotide (SANDOSTATIN) infusion 50 mcg/hr (02/27/14 0226)    Principal Problem:   Hematemesis/vomiting blood Active Problems:   Ascites   Sinus tachycardia   Cirrhosis   Hypokalemia  Time spent: 25  This note has been created with Surveyor, quantity. Any transcriptional errors are unintentional.   Marzetta Board, MD [facility] Pager [phone]. If 7 PM - 7 AM, please contact night-coverage at [email or web address], password [facility] 02/27/2014, 7:31 AM  LOS: 3 days

## 2014-02-27 NOTE — Progress Notes (Signed)
PT EXTUBATED. TOLERATED WELL. O2 AT 6L/MIN VIA [place]. O2 SAT REMAINS 100%

## 2014-02-27 NOTE — Procedures (Signed)
Extubation Procedure Note  Patient Details:   Name: Wanda [name] DOB: February 16, 1976 MRN: [id number]   Airway Documentation:   AT 0940 RT performed weaning parameters on patient, patient NIF -50, FVC 2L and audible air leakage around cuff. ABG performed and results were within normal range. Parameters called to physician and RT received orders to extubate. RT placed patient on 4L O2 via nasal cannula. SATs maintaining 100%,  HR 104 and RR 10. RN at bedside, RT will continue to monitor  Evaluation  O2 sats: stable throughout Complications: No apparent complications Patient did tolerate procedure well. Bilateral Breath Sounds: Clear;Diminished Suctioning: Airway Yes  Lana Fish 02/27/2014, 10:18 AM

## 2014-02-27 NOTE — Consult Note (Signed)
Reason for Consult: Worsening of renal failure Referring Physician: Dr.Gherghe  Wanda [name] is an 38 y.o. female.  HPI: She is a patient who has liver cirrhosis, chronic renal failure, bipolar disorder and anasarca presently came with history of hematemesis. Patient presently intubated. She is alert and in no apparent distress. Patient is known to me and she had multiple admission for anasarca and worsening of renal failure. During her last admission patient was managed with albumin, IV Lasix and metolazone. Her anasarca improved and discharged home to be followed as an outpatient. Presently patient states that she is and very well with her edema and swelling and she came to the hospital.  Past Medical History  Diagnosis Date  . GERD (gastroesophageal reflux disease)   . Liver disease 10/05/13    Liver bx 11/23/13 (delayed initially due to patient refusal). c/w steatohepatitis  . Acute renal failure 09/2013    Pre-renal- resolved  . Folate deficiency 09/2013  . Anasarca 10/10/2013    Past Surgical History  Procedure Laterality Date  . None    . Paracentesis  Feb 2015    1180 fluid, negative fluid analysis.   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 Paracentesis  10/2013  . Colonoscopy N/A 12/19/2013    Procedure: COLONOSCOPY;  Surgeon: Danie Binder, MD;  Location: [facility];  Service: Endoscopy;  Laterality: N/A;  9:30  . Esophagogastroduodenoscopy N/A 02/11/2014    Procedure: ESOPHAGOGASTRODUODENOSCOPY (EGD);  Surgeon: Daneil Dolin, MD;  Location: [facility];  Service: Endoscopy;  Laterality: N/A;    Family History  Problem Relation Age of Onset  . Heart disease Mother   . Colon cancer Neg Hx   . Liver disease Neg Hx     Social History:  reports that she quit smoking about 5 years ago. Her smoking use included Cigarettes. She has a 5  pack-year smoking history. She has never used smokeless tobacco. She reports that she does not drink alcohol or use illicit drugs.  Allergies: No Known Allergies  Medications: I have reviewed the patient's current medications.  Results for orders placed during the hospital encounter of 02/24/14 (from the past 48 hour(s))  URINALYSIS, ROUTINE W REFLEX MICROSCOPIC     Status: Abnormal   Collection Time    02/25/14  3:31 PM      Result Value Ref Range   Color, Urine ORANGE (*) YELLOW   Comment: BIOCHEMICALS MAY BE AFFECTED BY COLOR   APPearance HAZY (*) CLEAR   Specific Gravity, Urine 1.010  1.005 - 1.030   pH 5.5  5.0 - 8.0   Glucose, UA NEGATIVE  NEGATIVE mg/dL   Hgb urine dipstick NEGATIVE  NEGATIVE   Bilirubin Urine MODERATE (*) NEGATIVE   Ketones, ur NEGATIVE  NEGATIVE mg/dL   Protein, ur TRACE (*) NEGATIVE mg/dL   Urobilinogen, UA 0.2  0.0 - 1.0 mg/dL   Nitrite POSITIVE (*) NEGATIVE   Leukocytes, UA NEGATIVE  NEGATIVE  SODIUM, URINE, RANDOM     Status: None   Collection Time    02/25/14  3:31 PM      Result Value Ref Range   Sodium, Ur 20    CREATININE, URINE, RANDOM     Status: None   Collection Time    02/25/14  3:31 PM      Result Value Ref Range   Creatinine, Urine 141.77  URINE MICROSCOPIC-ADD ON     Status: Abnormal   Collection Time    02/25/14  3:31 PM      Result Value Ref Range   Squamous Epithelial / LPF FEW (*) RARE   WBC, UA 0-2  <3 WBC/hpf   Bacteria, UA FEW (*) RARE  CBC     Status: Abnormal   Collection Time    02/25/14  3:44 PM      Result Value Ref Range   WBC 6.8  4.0 - 10.5 K/uL   RBC 2.47 (*) 3.87 - 5.11 MIL/uL   Hemoglobin 8.3 (*) 12.0 - 15.0 g/dL   HCT 25.0 (*) 36.0 - 46.0 %   MCV 101.2 (*) 78.0 - 100.0 fL   MCH 33.6  26.0 - 34.0 pg   MCHC 33.2  30.0 - 36.0 g/dL   RDW 18.7 (*) 11.5 - 15.5 %   Platelets 154  150 - 400 K/uL  CBC     Status: Abnormal   Collection Time    02/25/14 10:05 PM      Result Value Ref Range   WBC 6.5  4.0 -  10.5 K/uL   RBC 2.44 (*) 3.87 - 5.11 MIL/uL   Hemoglobin 8.1 (*) 12.0 - 15.0 g/dL   HCT 24.6 (*) 36.0 - 46.0 %   MCV 100.8 (*) 78.0 - 100.0 fL   MCH 33.2  26.0 - 34.0 pg   MCHC 32.9  30.0 - 36.0 g/dL   RDW 19.2 (*) 11.5 - 15.5 %   Platelets 156  150 - 400 K/uL  CBC     Status: Abnormal   Collection Time    02/26/14  5:00 AM      Result Value Ref Range   WBC 7.0  4.0 - 10.5 K/uL   RBC 2.39 (*) 3.87 - 5.11 MIL/uL   Hemoglobin 8.0 (*) 12.0 - 15.0 g/dL   HCT 24.1 (*) 36.0 - 46.0 %   MCV 100.8 (*) 78.0 - 100.0 fL   MCH 33.5  26.0 - 34.0 pg   MCHC 33.2  30.0 - 36.0 g/dL   RDW 19.5 (*) 11.5 - 15.5 %   Platelets 155  150 - 400 K/uL  COMPREHENSIVE METABOLIC PANEL     Status: Abnormal   Collection Time    02/26/14  5:00 AM      Result Value Ref Range   Sodium 142  137 - 147 mEq/L   Potassium 3.1 (*) 3.7 - 5.3 mEq/L   Chloride 104  96 - 112 mEq/L   CO2 25  19 - 32 mEq/L   Glucose, Bld 94  70 - 99 mg/dL   BUN 46 (*) 6 - 23 mg/dL   Creatinine, Ser 1.49 (*) 0.50 - 1.10 mg/dL   Calcium 7.9 (*) 8.4 - 10.5 mg/dL   Total Protein 5.8 (*) 6.0 - 8.3 g/dL   Albumin 2.0 (*) 3.5 - 5.2 g/dL   AST 169 (*) 0 - 37 U/L   ALT 17  0 - 35 U/L   Alkaline Phosphatase 106  39 - 117 U/L   Total Bilirubin 2.7 (*) 0.3 - 1.2 mg/dL   GFR calc non Af Amer 43 (*) >90 mL/min   GFR calc Af Amer 50 (*) >90 mL/min   Comment: (NOTE)     The eGFR has been calculated using the CKD EPI equation.     This calculation has not been validated in all clinical situations.     eGFR's persistently <  90 mL/min signify possible Chronic Kidney     Disease.   Anion gap 13  5 - 15  PROTIME-INR     Status: Abnormal   Collection Time    02/26/14  5:00 AM      Result Value Ref Range   Prothrombin Time 15.4 (*) 11.6 - 15.2 seconds   INR 1.22  0.00 - 1.49  MAGNESIUM     Status: None   Collection Time    02/26/14  5:00 AM      Result Value Ref Range   Magnesium 1.5  1.5 - 2.5 mg/dL  PHOSPHORUS     Status: None   Collection  Time    02/26/14  5:00 AM      Result Value Ref Range   Phosphorus 3.4  2.3 - 4.6 mg/dL  CBC     Status: Abnormal   Collection Time    02/26/14  7:34 AM      Result Value Ref Range   WBC 6.5  4.0 - 10.5 K/uL   RBC 2.29 (*) 3.87 - 5.11 MIL/uL   Hemoglobin 7.6 (*) 12.0 - 15.0 g/dL   HCT 23.2 (*) 36.0 - 46.0 %   MCV 101.3 (*) 78.0 - 100.0 fL   MCH 33.2  26.0 - 34.0 pg   MCHC 32.8  30.0 - 36.0 g/dL   RDW 19.5 (*) 11.5 - 15.5 %   Platelets 150  150 - 400 K/uL  PREPARE RBC (CROSSMATCH)     Status: None   Collection Time    02/26/14  8:00 AM      Result Value Ref Range   Order Confirmation ORDER PROCESSED BY BLOOD BANK    CBC     Status: Abnormal   Collection Time    02/26/14  2:09 PM      Result Value Ref Range   WBC 7.4  4.0 - 10.5 K/uL   RBC 2.77 (*) 3.87 - 5.11 MIL/uL   Hemoglobin 9.1 (*) 12.0 - 15.0 g/dL   HCT 27.1 (*) 36.0 - 46.0 %   MCV 97.8  78.0 - 100.0 fL   MCH 32.9  26.0 - 34.0 pg   MCHC 33.6  30.0 - 36.0 g/dL   RDW 20.9 (*) 11.5 - 15.5 %   Platelets 148 (*) 150 - 400 K/uL  CBC     Status: Abnormal   Collection Time    02/26/14  8:17 PM      Result Value Ref Range   WBC 7.7  4.0 - 10.5 K/uL   RBC 2.85 (*) 3.87 - 5.11 MIL/uL   Hemoglobin 9.3 (*) 12.0 - 15.0 g/dL   HCT 28.0 (*) 36.0 - 46.0 %   MCV 98.2  78.0 - 100.0 fL   MCH 32.6  26.0 - 34.0 pg   MCHC 33.2  30.0 - 36.0 g/dL   RDW 21.8 (*) 11.5 - 15.5 %   Platelets 166  150 - 400 K/uL  CBC     Status: Abnormal   Collection Time    02/27/14  3:03 AM      Result Value Ref Range   WBC 8.1  4.0 - 10.5 K/uL   RBC 2.88 (*) 3.87 - 5.11 MIL/uL   Hemoglobin 9.5 (*) 12.0 - 15.0 g/dL   HCT 28.5 (*) 36.0 - 46.0 %   MCV 99.0  78.0 - 100.0 fL   MCH 33.0  26.0 - 34.0 pg   MCHC 33.3  30.0 - 36.0 g/dL  RDW 21.9 (*) 11.5 - 15.5 %   Platelets 153  150 - 400 K/uL  COMPREHENSIVE METABOLIC PANEL     Status: Abnormal   Collection Time    02/27/14  3:03 AM      Result Value Ref Range   Sodium 139  137 - 147 mEq/L    Potassium 3.6 (*) 3.7 - 5.3 mEq/L   Chloride 103  96 - 112 mEq/L   CO2 22  19 - 32 mEq/L   Glucose, Bld 99  70 - 99 mg/dL   BUN 46 (*) 6 - 23 mg/dL   Creatinine, Ser 1.95 (*) 0.50 - 1.10 mg/dL   Calcium 8.1 (*) 8.4 - 10.5 mg/dL   Total Protein 6.0  6.0 - 8.3 g/dL   Albumin 2.1 (*) 3.5 - 5.2 g/dL   AST 121 (*) 0 - 37 U/L   ALT 16  0 - 35 U/L   Alkaline Phosphatase 102  39 - 117 U/L   Total Bilirubin 3.5 (*) 0.3 - 1.2 mg/dL   GFR calc non Af Amer 31 (*) >90 mL/min   GFR calc Af Amer 36 (*) >90 mL/min   Comment: (NOTE)     The eGFR has been calculated using the CKD EPI equation.     This calculation has not been validated in all clinical situations.     eGFR's persistently <90 mL/min signify possible Chronic Kidney     Disease.   Anion gap 14  5 - 15  PROTIME-INR     Status: Abnormal   Collection Time    02/27/14  3:03 AM      Result Value Ref Range   Prothrombin Time 15.4 (*) 11.6 - 15.2 seconds   INR 1.22  0.00 - 1.49  MAGNESIUM     Status: None   Collection Time    02/27/14  3:03 AM      Result Value Ref Range   Magnesium 1.6  1.5 - 2.5 mg/dL  PHOSPHORUS     Status: None   Collection Time    02/27/14  3:03 AM      Result Value Ref Range   Phosphorus 3.2  2.3 - 4.6 mg/dL  CBC     Status: Abnormal   Collection Time    02/27/14  7:16 AM      Result Value Ref Range   WBC 7.4  4.0 - 10.5 K/uL   RBC 2.90 (*) 3.87 - 5.11 MIL/uL   Hemoglobin 9.3 (*) 12.0 - 15.0 g/dL   HCT 28.7 (*) 36.0 - 46.0 %   MCV 99.0  78.0 - 100.0 fL   MCH 32.1  26.0 - 34.0 pg   MCHC 32.4  30.0 - 36.0 g/dL   RDW 21.9 (*) 11.5 - 15.5 %   Platelets 160  150 - 400 K/uL    Dg Chest Port 1 View  02/25/2014   CLINICAL DATA:  Status post intubation.  EXAM: PORTABLE CHEST - 1 VIEW  COMPARISON:  Chest x-ray earlier today.  FINDINGS: Endotracheal tube is been placed with the tip approximately 2.5 cm above the carina and in good position. Lungs show mild bibasilar atelectasis. There is no evidence of pulmonary  edema, consolidation, pneumothorax, nodule or pleural fluid. The heart size is stable and within normal limits.  IMPRESSION: Appropriate positioning of endotracheal tube. Bibasilar atelectasis without acute pulmonary process identified.   Electronically Signed   By: Aletta Edouard M.D.   On: 02/25/2014 13:26    Review  of Systems  Cardiovascular: Positive for leg swelling.  Gastrointestinal: Positive for melena.       Patient came to the hospital because of hematemesis.   Blood pressure 86/45, pulse 74, temperature 97.7 F (36.5 C), temperature source Axillary, resp. rate 12, height _0  (1.575 m), weight 63 kg (138 lb 14.2 oz), SpO2 83.00%. Physical Exam  Vitals reviewed. Constitutional: She is oriented to person, place, and time.  Patient presently intubated. Her she is alert in, can by writing and also by shaking her head. Patient not on the paper saying that she has been doing good with the swelling and no problem with her breathing before she came  Eyes: No scleral icterus.  Neck: No JVD present.  Cardiovascular: Regular rhythm.   Respiratory: She has no wheezes. She has no rales.  GI: She exhibits distension.  Musculoskeletal: She exhibits edema.  Neurological: She is alert and oriented to person, place, and time.    Assessment/Plan: Problem #1 renal failure: Chronic. Presently slightly higher than her baseline. Most likely this is secondary to fluid removal associated with her diuretics. Problem #2 history of GI bleeding: Patient is being followed by GI Problem #3 history of anasarca: Patient has shown significant improvement probable last admission. She has some ascites but her leg edema seems to be much better. Problem #4 liver cirrhosis Problem #5 bipolar disorder Problem #6 anemia: She status post blood transfusion. Problem #7 hypokalemia: Most likely secondary to diuretics: Her potassium is replaced and presently with the normal range. Problem #8 history of Kappa and  Lambda chain proteinuria. Plan: Agree with slow hydration.  Agree with holding her diuretics We'll continue to follow patient.   Marialuisa Basara S 02/27/2014, 8:04 AM

## 2014-02-28 ENCOUNTER — Encounter (HOSPITAL_COMMUNITY): Payer: Self-pay | Admitting: Internal Medicine

## 2014-02-28 ENCOUNTER — Inpatient Hospital Stay (HOSPITAL_COMMUNITY): Payer: Medicare Other

## 2014-02-28 DIAGNOSIS — D62 Acute posthemorrhagic anemia: Secondary | ICD-10-CM

## 2014-02-28 DIAGNOSIS — D7589 Other specified diseases of blood and blood-forming organs: Secondary | ICD-10-CM

## 2014-02-28 DIAGNOSIS — R609 Edema, unspecified: Secondary | ICD-10-CM

## 2014-02-28 DIAGNOSIS — N179 Acute kidney failure, unspecified: Secondary | ICD-10-CM | POA: Diagnosis not present

## 2014-02-28 DIAGNOSIS — I8501 Esophageal varices with bleeding: Secondary | ICD-10-CM

## 2014-02-28 HISTORY — DX: Other specified diseases of blood and blood-forming organs: D75.89

## 2014-02-28 HISTORY — DX: Esophageal varices with bleeding: I85.01

## 2014-02-28 LAB — BODY FLUID CELL COUNT WITH DIFFERENTIAL
EOS FL: 0 %
Lymphs, Fluid: 23 %
Monocyte-Macrophage-Serous Fluid: 40 % — ABNORMAL LOW (ref 50–90)
NEUTROPHIL FLUID: 37 % — AB (ref 0–25)
Total Nucleated Cell Count, Fluid: 114 cu mm (ref 0–1000)

## 2014-02-28 LAB — COMPREHENSIVE METABOLIC PANEL
ALT: 15 U/L (ref 0–35)
AST: 78 U/L — ABNORMAL HIGH (ref 0–37)
Albumin: 2.3 g/dL — ABNORMAL LOW (ref 3.5–5.2)
Alkaline Phosphatase: 108 U/L (ref 39–117)
Anion gap: 14 (ref 5–15)
BUN: 39 mg/dL — AB (ref 6–23)
CHLORIDE: 106 meq/L (ref 96–112)
CO2: 23 meq/L (ref 19–32)
CREATININE: 1.63 mg/dL — AB (ref 0.50–1.10)
Calcium: 8.3 mg/dL — ABNORMAL LOW (ref 8.4–10.5)
GFR, EST AFRICAN AMERICAN: 45 mL/min — AB (ref >90)
GFR, EST NON AFRICAN AMERICAN: 39 mL/min — AB (ref >90)
Glucose, Bld: 120 mg/dL — ABNORMAL HIGH (ref 70–99)
Potassium: 3.4 mEq/L — ABNORMAL LOW (ref 3.7–5.3)
Sodium: 143 mEq/L (ref 137–147)
Total Bilirubin: 2.8 mg/dL — ABNORMAL HIGH (ref 0.3–1.2)
Total Protein: 6.2 g/dL (ref 6.0–8.3)

## 2014-02-28 LAB — CBC
HEMATOCRIT: 28.4 % — AB (ref 36.0–46.0)
Hemoglobin: 9.4 g/dL — ABNORMAL LOW (ref 12.0–15.0)
MCH: 32.9 pg (ref 26.0–34.0)
MCHC: 33.1 g/dL (ref 30.0–36.0)
MCV: 99.3 fL (ref 78.0–100.0)
Platelets: 191 10*3/uL (ref 150–400)
RBC: 2.86 MIL/uL — ABNORMAL LOW (ref 3.87–5.11)
RDW: 20.9 % — AB (ref 11.5–15.5)
WBC: 9 10*3/uL (ref 4.0–10.5)

## 2014-02-28 LAB — PHOSPHORUS: Phosphorus: 3.5 mg/dL (ref 2.3–4.6)

## 2014-02-28 LAB — PROTIME-INR
INR: 1.22 (ref 0.00–1.49)
PROTHROMBIN TIME: 15.4 s — AB (ref 11.6–15.2)

## 2014-02-28 LAB — MAGNESIUM: Magnesium: 1.4 mg/dL — ABNORMAL LOW (ref 1.5–2.5)

## 2014-02-28 LAB — AMMONIA: AMMONIA: 31 umol/L (ref 11–60)

## 2014-02-28 MED ORDER — FENTANYL CITRATE 0.05 MG/ML IJ SOLN
50.0000 ug | INTRAMUSCULAR | Status: DC | PRN
  Administered 2014-03-01 – 2014-03-02 (×6): 50 ug via INTRAVENOUS
  Filled 2014-02-28 (×6): qty 2

## 2014-02-28 MED ORDER — POTASSIUM CHLORIDE CRYS ER 20 MEQ PO TBCR
20.0000 meq | EXTENDED_RELEASE_TABLET | Freq: Two times a day (BID) | ORAL | Status: AC
  Administered 2014-02-28 (×2): 20 meq via ORAL
  Filled 2014-02-28 (×2): qty 1

## 2014-02-28 MED ORDER — TEMAZEPAM 7.5 MG PO CAPS
7.5000 mg | ORAL_CAPSULE | Freq: Once | ORAL | Status: AC
  Administered 2014-02-28: 7.5 mg via ORAL
  Filled 2014-02-28: qty 1

## 2014-02-28 MED ORDER — MAGNESIUM SULFATE 50 % IJ SOLN: 1.0000 g | Freq: Once | INTRAVENOUS | Status: DC

## 2014-02-28 MED ORDER — BOOST PLUS PO LIQD
237.0000 mL | Freq: Two times a day (BID) | ORAL | Status: DC
  Administered 2014-02-28 – 2014-03-02 (×3): 237 mL via ORAL
  Filled 2014-02-28 (×11): qty 237

## 2014-02-28 MED ORDER — ALBUMIN HUMAN 25 % IV SOLN
25.0000 g | INTRAVENOUS | Status: AC
  Administered 2014-02-28: 25 g via INTRAVENOUS
  Filled 2014-02-28: qty 100

## 2014-02-28 MED ORDER — PANTOPRAZOLE SODIUM 40 MG PO TBEC
40.0000 mg | DELAYED_RELEASE_TABLET | Freq: Two times a day (BID) | ORAL | Status: DC
  Administered 2014-02-28 – 2014-03-01 (×2): 40 mg via ORAL
  Filled 2014-02-28 (×5): qty 1

## 2014-02-28 MED ORDER — MAGNESIUM SULFATE IN D5W 10-5 MG/ML-% IV SOLN
1.0000 g | Freq: Once | INTRAVENOUS | Status: AC
  Administered 2014-02-28: 1 g via INTRAVENOUS
  Filled 2014-02-28: qty 100

## 2014-02-28 NOTE — Progress Notes (Signed)
Attending note:  Seen and examined in the ICU this morning. Agree with above assessment and recommendations as outlined.

## 2014-02-28 NOTE — Progress Notes (Signed)
[facility] PROGRESS NOTE  Wanda [name] GMW:[id number] DOB: 02-28-1976 DOA: 02/24/2014 PCP: No PCP Per Patient    Code Status: Full code Family Communication: Family not available Disposition Plan: Discharge to home when clinically appropriate.   Consultants:  Gastroenterology  Nephrology  Procedures:  Therapeutic paracentesis, pending  Intubation 7/19 for airway protection, extubated 7/21  EGD, Dr. Gala Romney 7/19  Insertion of right femoral vein central line on 7/19, Dr. Arnoldo Morale.  Antibiotics:  Rocephin 7/18>>  HPI/Subjective: The patient complains of abdominal swelling and tightness. Otherwise, no complaints.  Objective: Filed Vitals:   02/28/14 0800  BP:   Pulse:   Temp: 97.9 F (36.6 C)  Resp:    temperature 97.9. Pulse 83. Respiratory rate 17. Blood pressure 90/53. Oxygen saturation 97% on room air.  Intake/Output Summary (Last 24 hours) at 02/28/14 1002 Last data filed at 02/28/14 0800  Gross per 24 hour  Intake   4105 ml  Output    900 ml  Net   3205 ml   Filed Weights   02/26/14 0500 02/27/14 0500 02/28/14 0500  Weight: 69.2 kg (152 lb 8.9 oz) 63 kg (138 lb 14.2 oz) 69.9 kg (154 lb 1.6 oz)    Exam:   General:  Debilitated-appearing 38 year old woman in no acute distress.  Cardiovascular: S1, S2, no murmurs rubs or gallops.  Respiratory: Rare crackles in the bases, otherwise clear anteriorly. Breathing nonlabored.  Abdomen: Positive bowel sounds, diffusely distended, mild generalized tenderness, tympanic.  Musculoskeletal: Trace pedal edema. No acute hot joints.  Neurologic: She is alert and oriented x3. Cranial nerves II through XII are grossly intact.   Data Reviewed: Basic Metabolic Panel:  Recent Labs Lab 02/24/14 2105 02/25/14 0533 02/26/14 0500 02/27/14 0303 02/28/14 0406  NA 139 139 142 139 143  K 3.0* 3.3* 3.1* 3.6* 3.4*  CL 95* 98 104 103 106  CO2 29 28 25 22 23   GLUCOSE 104* 118* 94 99 120*  BUN 39* 47* 46*  46* 39*  CREATININE 1.03 1.15* 1.49* 1.95* 1.63*  CALCIUM 8.9 8.2* 7.9* 8.1* 8.3*  MG  --   --  1.5 1.6 1.4*  PHOS  --   --  3.4 3.2 3.5   Liver Function Tests:  Recent Labs Lab 02/24/14 2105 02/25/14 0533 02/26/14 0500 02/27/14 0303 02/28/14 0406  AST 68* 50* 169* 121* 78*  ALT 15 11 17 16 15   ALKPHOS 156* 118* 106 102 108  BILITOT 3.0* 2.5* 2.7* 3.5* 2.8*  PROT 7.3 5.9* 5.8* 6.0 6.2  ALBUMIN 2.7* 2.1* 2.0* 2.1* 2.3*    Recent Labs Lab 02/24/14 2115  LIPASE 36    Recent Labs Lab 02/24/14 2115  AMMONIA 44   CBC:  Recent Labs Lab 02/24/14 2105  02/26/14 2017 02/27/14 0303 02/27/14 0716 02/27/14 1246 02/28/14 0406  WBC 10.2  < > 7.7 8.1 7.4 9.0 9.0  NEUTROABS 7.3  --   --   --   --   --   --   HGB 10.1*  < > 9.3* 9.5* 9.3* 9.8* 9.4*  HCT 30.4*  < > 28.0* 28.5* 28.7* 29.9* 28.4*  MCV 102.4*  < > 98.2 99.0 99.0 100.0 99.3  PLT 212  < > 166 153 160 PLATELET CLUMPS NOTED ON SMEAR, COUNT APPEARS ADEQUATE 191  < > = values in this interval not displayed. Cardiac Enzymes:  Recent Labs Lab 02/24/14 2115  TROPONINI <0.30   BNP (last 3 results) No results found for this basename: PROBNP,  in  the last 8760 hours CBG: No results found for this basename: GLUCAP,  in the last 168 hours  Recent Results (from the past 240 hour(s))  CULTURE, BLOOD (ROUTINE X 2)     Status: None   Collection Time    02/24/14 11:11 PM      Result Value Ref Range Status   Specimen Description BLOOD RIGHT ANTECUBITAL DRAWN BY RN   Final   Special Requests BOTTLES DRAWN AEROBIC ONLY 12CC   Final   Culture NO GROWTH 3 DAYS   Final   Report Status PENDING   Incomplete  CULTURE, BLOOD (ROUTINE X 2)     Status: None   Collection Time    02/24/14 11:11 PM      Result Value Ref Range Status   Specimen Description BLOOD LEFT ANTECUBITAL DRAWN BY RN   Final   Special Requests BOTTLES DRAWN AEROBIC ONLY 8CC   Final   Culture NO GROWTH 3 DAYS   Final   Report Status PENDING   Incomplete   MRSA PCR SCREENING     Status: None   Collection Time    02/25/14  1:13 AM      Result Value Ref Range Status   MRSA by PCR NEGATIVE  NEGATIVE Final   Comment:            The GeneXpert MRSA Assay (FDA     approved for NASAL specimens     only), is one component of a     comprehensive MRSA colonization     surveillance program. It is not     intended to diagnose MRSA     infection nor to guide or     monitor treatment for     MRSA infections.     Studies: No results found.  Scheduled Meds: . albumin human  25 g Intravenous On Call  . antiseptic oral rinse  15 mL Mouth Rinse QID  . cefTRIAXone (ROCEPHIN)  IV  1 g Intravenous Q12H  . chlorhexidine  15 mL Mouth Rinse BID  . pantoprazole  40 mg Oral BID AC  . sodium chloride  10-40 mL Intracatheter Q12H  . sodium chloride  3 mL Intravenous Q12H   Continuous Infusions: . sodium chloride 100 mL/hr at 02/28/14 0800  . octreotide (SANDOSTATIN) infusion 50 mcg/hr (02/28/14 0800)    Assessment and plan:  Principal Problem:   Hematemesis/vomiting blood Active Problems:   Ascites   Sinus tachycardia   Cirrhosis   Hypokalemia   Acute renal injury   Acute blood loss anemia   Macrocytosis   1. Upper GI bleed secondary to esophageal varices. Status post EGD on 7/19. (Awaiting the official report) Currently on octreotide and IV Protonix. Bleeding appears to have stopped. Continue to monitor. Appreciate GI assistance.  Abdominal ascites, secondary to [place] cirrhosis. Paracentesis is pending. With her peripheral edema, she may need restart of Zaroxolyn. This will be deferred to nephrology.  NASH cirrhosis. We'll continue supportive treatment. Lactulose and Zaroxolyn apparently on hold, but no signs of hepatic encephalopathy. We'll check an ammonia level tomorrow morning.  Acute blood loss anemia superimposed on chronic anemia. Status post transfusion on 7/20 when her hemoglobin gradually fell from 10.1 on admission to 7.6.  Hemoglobin is currently stable. Continue to monitor  Acute renal failure. Creatinine 1.03 on admission and reached a high of 1.95. Etiology possibly ATN versus prerenal azotemia from blood loss. Nephrology is assisting with management, appreciate his assistance. She is being hydrated with IV fluids.  Currently, Zaroxolyn is on hold, but may need to be restarted. Her creatinine has improved some.  Macrocytosis. Likely secondary to cirrhosis. Both her vitamin B12 and folate levels were within normal limits March 2015.  Chronic sinus tachycardia. Acute tachycardia likely secondary to volume depletion and blood loss. TSH in April 2015 was modestly elevated at 7, but free T4 was within normal limits.  Hypokalemia. Her magnesium level is borderline low. Will replete both cautiously. We'll give 1 g of magnesium sulfate and oral potassium on a day-to-day basis.    Time spent: 40 minutes    [facility] Pager [phone]. If 7PM-7AM, please contact night-coverage at [email or web address], password [facility] 02/28/2014, 10:02 AM  LOS: 4 days

## 2014-02-28 NOTE — Plan of Care (Signed)
Problem: Phase I Progression Outcomes Goal: Pain controlled with appropriate interventions Outcome: Progressing C/o pain decreasing PRN pain medications ordered Goal: OOB as tolerated unless otherwise ordered Outcome: Progressing Currently on bedrest Goal: Initial discharge plan identified Outcome: Progressing Home with family Goal: Voiding-avoid urinary catheter unless indicated Outcome: Progressing Currently has a catheter; extubated less than 24 hours Goal: Hemodynamically stable Outcome: Progressing Vital signs stable

## 2014-02-28 NOTE — Progress Notes (Signed)
Wanda [name]  MRN: [id number]  DOB/AGE: [birth year] 38 y.o.  Primary Care Physician:No PCP Per Patient  Admit date: 02/24/2014  Chief Complaint:  Chief Complaint  Patient presents with  . Hematemesis    [name]-Pt presented on  02/24/2014 with  Chief Complaint  Patient presents with  . Hematemesis  .    Pt today feels better.    Pt offers no new complaints.  Meds . antiseptic oral rinse  15 mL Mouth Rinse QID  . cefTRIAXone (ROCEPHIN)  IV  1 g Intravenous Q12H  . chlorhexidine  15 mL Mouth Rinse BID  . pantoprazole (PROTONIX) IV  40 mg Intravenous Q12H  . sodium chloride  10-40 mL Intracatheter Q12H  . sodium chloride  3 mL Intravenous Q12H     Physical Exam: Vital signs in last 24 hours: Temp:  [97.2 F (36.2 C)-98.3 F (36.8 C)] 98.1 F (36.7 C) (07/22 0400) Pulse Rate:  [78-121] 87 (07/22 0630) Resp:  [10-21] 14 (07/22 0630) BP: (91-132)/(50-82) 92/53 mmHg (07/22 0630) SpO2:  [90 %-100 %] 95 % (07/22 0630) FiO2 (%):  [40 %] 40 % (07/21 0820) Weight:  [154 lb 1.6 oz (69.9 kg)] 154 lb 1.6 oz (69.9 kg) (07/22 0500) Weight change: 15 lb 3.4 oz (6.9 kg) Last BM Date: 02/25/14  Intake/Output from previous day: 07/21 0701 - 07/22 0700 In: 3960 [P.O.:1320; I.V.:2540; IV Piggyback:100] Out: 550 [Urine:550]     Physical Exam: General- pt is awake,alert, oriented to time place and person Resp- No acute REsp distress, CTA B/L NO Rhonchi CVS- S1S2 regular in rate and rhythm GIT- BS+, soft, distended +, ascitics+ EXT- trace LE Edema,NO Cyanosis   Lab Results: CBC  Recent Labs  02/27/14 1246 02/28/14 0406  WBC 9.0 9.0  HGB 9.8* 9.4*  HCT 29.9* 28.4*  PLT PLATELET CLUMPS NOTED ON SMEAR, COUNT APPEARS ADEQUATE 191    BMET  Recent Labs  02/27/14 0303 02/28/14 0406  NA 139 143  K 3.6* 3.4*  CL 103 106  CO2 22 23  GLUCOSE 99 120*  BUN 46* 39*  CREATININE 1.95* 1.63*  CALCIUM 8.1* 8.3*   Trend Creat 2015  1.15=>1.95=>1.63            2.3==>1.21=>  1.08 ( April admission)           3.11==>1.36==>1.18 ( February admission)  MICRO Recent Results (from the past 240 hour([name]))  CULTURE, BLOOD (ROUTINE X 2)     Status: None   Collection Time    02/24/14 11:11 PM      Result Value Ref Range Status   Specimen Description BLOOD RIGHT ANTECUBITAL DRAWN BY RN   Final   Special Requests BOTTLES DRAWN AEROBIC ONLY 12CC   Final   Culture NO GROWTH 3 DAYS   Final   Report Status PENDING   Incomplete  CULTURE, BLOOD (ROUTINE X 2)     Status: None   Collection Time    02/24/14 11:11 PM      Result Value Ref Range Status   Specimen Description BLOOD LEFT ANTECUBITAL DRAWN BY RN   Final   Special Requests BOTTLES DRAWN AEROBIC ONLY 8CC   Final   Culture NO GROWTH 3 DAYS   Final   Report Status PENDING   Incomplete  MRSA PCR SCREENING     Status: None   Collection Time    02/25/14  1:13 AM      Result Value Ref Range Status   MRSA by PCR NEGATIVE  NEGATIVE Final   Comment:            The GeneXpert MRSA Assay (FDA     approved for NASAL specimens     only), is one component of a     comprehensive MRSA colonization     surveillance program. It is not     intended to diagnose MRSA     infection nor to guide or     monitor treatment for     MRSA infections.      Lab Results  Component Value Date   CALCIUM 8.3* 02/28/2014   PHOS 3.5 02/28/2014       Impression: 1)Renal  AKI secondary to Prerenal/ATN               AKI on CKD               CKD sec to Multiple AKI              2)CVS- Hemodynamically stable  3)Anemia HGb  at goal (9--11) Admitted with GI bleed  4)Liver- Cirrhosis Sec to [place] hepatitis  5)Hypokalemia Being repleted   6)GERD on PPI  7)Acid base Co2 at goal  8) Ascitics   Sec to Cirrhosis To be tapped today  9) GI-admitted with GI bleed  [name]/p EGD GI following  Plan:  Will reduce IVf rate. Will probably need lasix soon.     Wanda [name] 02/28/2014, 7:39 AM

## 2014-02-28 NOTE — Progress Notes (Addendum)
    Subjective: No evidence of overt GI bleeding. Patient alert and oriented. Extubated. Desires to eat. Vague abdominal discomfort. No N/V.   Objective: Vital signs in last 24 hours: Temp:  [97.5 F (36.4 C)-98.3 F (36.8 C)] 98.1 F (36.7 C) (07/22 0400) Pulse Rate:  [78-121] 87 (07/22 0630) Resp:  [10-21] 14 (07/22 0630) BP: (91-132)/(50-82) 92/53 mmHg (07/22 0630) SpO2:  [90 %-100 %] 95 % (07/22 0630) FiO2 (%):  [40 %] 40 % (07/21 0820) Weight:  [154 lb 1.6 oz (69.9 kg)] 154 lb 1.6 oz (69.9 kg) (07/22 0500) Last BM Date: 02/25/14 General:   Alert and oriented, pleasant Head:  Normocephalic and atraumatic. Eyes:  +mild scleral icterus Abdomen:  Bowel sounds present, round, tense ascites, no rebound or guarding, no significant tenderness to palpation. Extremities:  Without edema. Neurologic:  Alert and  oriented x4 Psych:  Alert and cooperative. Flat affect  Intake/Output from previous day: 07/21 0701 - 07/22 0700 In: 3960 [P.O.:1320; I.V.:2540; IV Piggyback:100] Out: 550 [Urine:550] Intake/Output this shift:    Lab Results: Lab Results  Component Value Date   WBC 9.0 02/28/2014   HGB 9.4* 02/28/2014   HCT 28.4* 02/28/2014   MCV 99.3 02/28/2014   PLT 191 02/28/2014   BMET  Recent Labs  02/26/14 0500 02/27/14 0303 02/28/14 0406  NA 142 139 143  K 3.1* 3.6* 3.4*  CL 104 103 106  CO2 25 22 23   GLUCOSE 94 99 120*  BUN 46* 46* 39*  CREATININE 1.49* 1.95* 1.63*  CALCIUM 7.9* 8.1* 8.3*   LFT  Recent Labs  02/26/14 0500 02/27/14 0303 02/28/14 0406  PROT 5.8* 6.0 6.2  ALBUMIN 2.0* 2.1* 2.3*  AST 169* 121* 78*  ALT 17 16 15   ALKPHOS 106 102 108  BILITOT 2.7* 3.5* 2.8*   PT/INR  Recent Labs  02/27/14 0303 02/28/14 0406  LABPROT 15.4* 15.4*  INR 1.22 1.22     Assessment: 38 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 Extubated on 7/21. Without any further signs of overt GI bleeding. Hgb  stable.   Ascites: paracentesis with fluid analysis to be ordered today.   AKI: nephrology consulted. Felt to be secondary to diuretics. Improving renal function.   Persistent tachycardia: some improvement. Consider elective cardiology evaluation in future.      Plan: US paracentesis with fluid analysis today Soft, low fat diet Octreotide drip for 72 hours total, may discontinue this evening. Antibiotics for total of 7 days PPI BID   Orvil Feil, ANP-BC [facility]     LOS: 4 days    02/28/2014, 8:01 AM    Addendum: not mentioned above, patient needs to be on non-selective beta-blocker due to presence of varices. However, with persistent hypotension, would recommend evaluating as outpatient once current issue resolved.

## 2014-02-28 NOTE — Progress Notes (Signed)
Nutrition Follow up (change in status)  INTERVENTION:  Boost High Protein BID (provides 360 kcal, 14 gr protein each 237 ml).  NUTRITION DIAGNOSIS: Inadequate oral intake; ongoing   Goal: Pt to meet >/= 90% of their estimated nutrition needs; not met   Monitor:  Diet advancement, meal and supplement intake, labs and wt trends   38 y.o. female  ASSESSMENT: Pt successfully extubated and wanting to eat. Diet advanced to Soft per GI note. She has ascites and paracentesis likely today. Magnesium is being repleted. Will follow her po intake and evaluate adequacy. Her nutrition requirements were reassessed now that she is off the vent.  Height: Ht Readings from Last 1 Encounters:  02/27/14 5' 2"  (1.575 m)    Weight: Wt Readings from Last 1 Encounters:  02/28/14 154 lb 1.6 oz (69.9 kg)    Ideal Body Weight: 110#  Wt Readings from Last 10 Encounters:  02/28/14 154 lb 1.6 oz (69.9 kg)  02/28/14 154 lb 1.6 oz (69.9 kg)  12/19/13 145 lb (65.772 kg)  12/19/13 145 lb (65.772 kg)  12/05/13 134 lb 9.6 oz (61.054 kg)  11/23/13 160 lb (72.576 kg)  11/17/13 168 lb 14 oz (76.6 kg)  11/17/13 168 lb 14 oz (76.6 kg)  10/12/13 172 lb (78.019 kg)  09/11/13 177 lb 11.2 oz (80.604 kg)    Usual Body Weight: 160-170#  BMI:  Body mass index is 28.18 kg/(m^2).overweight  Estimated Nutritional Needs: Kcal: 1540-1680 Protein: 95-105 gr Fluid: per MD goals  Skin: intact  Diet Order: Clear Liquid  EDUCATION NEEDS: -No education needs identified at this time   Intake/Output Summary (Last 24 hours) at 02/28/14 1206 Last data filed at 02/28/14 1115  Gross per 24 hour  Intake   4470 ml  Output   1000 ml  Net   3470 ml    Last BM: 02/25/14 loose, brown stool, abdominal distention   Labs:   Recent Labs Lab 02/26/14 0500 02/27/14 0303 02/28/14 0406  NA 142 139 143  K 3.1* 3.6* 3.4*  CL 104 103 106  CO2 25 22 23   BUN 46* 46* 39*  CREATININE 1.49* 1.95* 1.63*  CALCIUM 7.9*  8.1* 8.3*  MG 1.5 1.6 1.4*  PHOS 3.4 3.2 3.5  GLUCOSE 94 99 120*    CBG (last 3)  No results found for this basename: GLUCAP,  in the last 72 hours  Scheduled Meds: . albumin human  25 g Intravenous On Call  . antiseptic oral rinse  15 mL Mouth Rinse QID  . cefTRIAXone (ROCEPHIN)  IV  1 g Intravenous Q12H  . chlorhexidine  15 mL Mouth Rinse BID  . pantoprazole  40 mg Oral BID AC  . potassium chloride  20 mEq Oral BID  . sodium chloride  10-40 mL Intracatheter Q12H  . sodium chloride  3 mL Intravenous Q12H    Continuous Infusions: . sodium chloride 75 mL/hr at 02/28/14 1102  . octreotide (SANDOSTATIN) infusion 50 mcg/hr (02/28/14 1102)    Past Medical History  Diagnosis Date  . GERD (gastroesophageal reflux disease)   . 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 c/w steatohepatitis  . Acute renal failure 09/2013    Pre-renal- resolved  . Folate deficiency 09/2013  . Anasarca 10/10/2013  . Hematemesis/vomiting blood 02/24/2014  . Acute blood loss anemia 02/25/2014    Status post transfusion  . Macrocytosis 02/28/2014  . Bleeding esophageal varices 02/28/2014    Past Surgical History  Procedure Laterality Date  . None    . Paracentesis  Feb 2015    1180 fluid, negative fluid analysis.   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 Paracentesis  10/2013  . Colonoscopy N/A 12/19/2013    Procedure: COLONOSCOPY;  Surgeon: Danie Binder, MD;  Location: [facility];  Service: Endoscopy;  Laterality: N/A;  9:30  . Esophagogastroduodenoscopy N/A 02/11/2014    Procedure: ESOPHAGOGASTRODUODENOSCOPY (EGD);  Surgeon: Daneil Dolin, MD;  Location: [facility];  Service: Endoscopy;  Laterality: N/A;    Colman Cater MS,RD,CSG,LDN Office: [phone] Pager: [phone]

## 2014-03-01 DIAGNOSIS — N179 Acute kidney failure, unspecified: Secondary | ICD-10-CM

## 2014-03-01 LAB — BASIC METABOLIC PANEL
ANION GAP: 11 (ref 5–15)
BUN: 28 mg/dL — ABNORMAL HIGH (ref 6–23)
CALCIUM: 8 mg/dL — AB (ref 8.4–10.5)
CO2: 22 mEq/L (ref 19–32)
Chloride: 110 mEq/L (ref 96–112)
Creatinine, Ser: 1.23 mg/dL — ABNORMAL HIGH (ref 0.50–1.10)
GFR calc Af Amer: 63 mL/min — ABNORMAL LOW (ref >90)
GFR, EST NON AFRICAN AMERICAN: 55 mL/min — AB (ref >90)
GLUCOSE: 122 mg/dL — AB (ref 70–99)
POTASSIUM: 3.7 meq/L (ref 3.7–5.3)
SODIUM: 143 meq/L (ref 137–147)

## 2014-03-01 LAB — CULTURE, BLOOD (ROUTINE X 2)
Culture: NO GROWTH
Culture: NO GROWTH

## 2014-03-01 LAB — CBC
HCT: 26.5 % — ABNORMAL LOW (ref 36.0–46.0)
HEMOGLOBIN: 8.9 g/dL — AB (ref 12.0–15.0)
MCH: 33.5 pg (ref 26.0–34.0)
MCHC: 33.6 g/dL (ref 30.0–36.0)
MCV: 99.6 fL (ref 78.0–100.0)
PLATELETS: 181 10*3/uL (ref 150–400)
RBC: 2.66 MIL/uL — ABNORMAL LOW (ref 3.87–5.11)
RDW: 20.4 % — ABNORMAL HIGH (ref 11.5–15.5)
WBC: 7.1 10*3/uL (ref 4.0–10.5)

## 2014-03-01 MED ORDER — SUCRALFATE 1 GM/10ML PO SUSP
1.0000 g | Freq: Three times a day (TID) | ORAL | Status: DC
  Administered 2014-03-01 – 2014-03-02 (×4): 1 g via ORAL
  Filled 2014-03-01 (×4): qty 10

## 2014-03-01 MED ORDER — TORSEMIDE 20 MG PO TABS
20.0000 mg | ORAL_TABLET | Freq: Every day | ORAL | Status: DC
  Administered 2014-03-01 – 2014-03-02 (×2): 20 mg via ORAL
  Filled 2014-03-01 (×2): qty 1

## 2014-03-01 NOTE — Progress Notes (Signed)
Subjective: Interval History: has no complaint of difficulty in breathing. She has some abdominal pain but no nausea or vomiting. Her appetite is still remains poor..  Objective: Vital signs in last 24 hours: Temp:  [97.3 F (36.3 C)-98.2 F (36.8 C)] 98.2 F (36.8 C) (07/23 0700) Pulse Rate:  [74-95] 95 (07/23 0700) Resp:  [13-26] 26 (07/23 0900) BP: (69-120)/(28-73) 90/51 mmHg (07/23 0900) SpO2:  [96 %-100 %] 100 % (07/23 0700) FiO2 (%):  [21 %] 21 % (07/23 0400) Weight:  [65.8 kg (145 lb 1 oz)-68 kg (149 lb 14.6 oz)] 68 kg (149 lb 14.6 oz) (07/23 0500) Weight change: -4.1 kg (-9 lb 0.6 oz)  Intake/Output from previous day: 07/22 0701 - 07/23 0700 In: 3543 [P.O.:1080; I.V.:2163; IV Piggyback:300] Out: 7591 [Urine:1175; Stool:1] Intake/Output this shift: Total I/O In: 360 [P.O.:360] Out: -   General appearance: alert, cooperative and no distress Resp: clear to auscultation bilaterally Cardio: regular rate and rhythm, S1, S2 normal, no murmur, click, rub or gallop GI: She is some distention, non-tender, positive bowel sound Extremities: extremities normal, atraumatic, no cyanosis or edema  Lab Results:  Recent Labs  02/28/14 0406 03/01/14 0410  WBC 9.0 7.1  HGB 9.4* 8.9*  HCT 28.4* 26.5*  PLT 191 181   BMET:  Recent Labs  02/28/14 0406 03/01/14 0410  NA 143 143  K 3.4* 3.7  CL 106 110  CO2 23 22  GLUCOSE 120* 122*  BUN 39* 28*  CREATININE 1.63* 1.23*  CALCIUM 8.3* 8.0*   No results found for this basename: PTH,  in the last 72 hours Iron Studies: No results found for this basename: IRON, TIBC, TRANSFERRIN, FERRITIN,  in the last 72 hours  Studies/Results: US Paracentesis  02/28/2014   CLINICAL DATA:  Ascites  EXAM: ULTRASOUND GUIDED  PARACENTESIS  COMPARISON:  None.  PROCEDURE: An ultrasound guided paracentesis was thoroughly discussed with the patient and questions answered. The benefits, risks, alternatives and complications were also discussed. The  patient understands and wishes to proceed with the procedure. Written consent was obtained.  Ultrasound was performed to localize and mark an adequate pocket of fluid in the right lower quadrant of the abdomen. The area was then prepped and draped in the normal sterile fashion. 1% Lidocaine was used for local anesthesia. Under ultrasound guidance a 19 gauge Yueh catheter was introduced. Paracentesis was performed. The catheter was removed and a dressing applied.  Complications: None.  FINDINGS: A total of approximately 6900 mL of clear straw-colored fluid was removed. A fluid sample was sent for laboratory analysis.  IMPRESSION: Successful ultrasound guided paracentesis yielding 6900 mL of ascites.   Electronically Signed   By: Rolm Baptise M.D.   On: 02/28/2014 12:48    I have reviewed the patient's current medications.  Assessment/Plan: Problem #1 renal failure: Acute on chronic. Possibly secondary to fluid removal. Presently her BUN and creatinine has returned to baseline. Problem #2 hypokalemia potassium has corrected Problem #3 anemia: H&H has declined slightly. Patient with history of GI bleeding Problem #4 anasarca: Status post paracentesis. Patient presently does not have significant sign of fluid overload. Problem #5 liver cirrhosis Problem #6 bipolar disorder Plan: Will DC his IV fluid Will start patient on Demadex 20 mg by mouth once a day on when necessary    LOS: 5 days   Gizell Danser S 03/01/2014,11:11 AM

## 2014-03-01 NOTE — Op Note (Addendum)
[facility] [address] [place], [address]   ENDOSCOPY PROCEDURE REPORT  PATIENT: Wanda [name], Wanda [name]  MR#: [id number] BIRTHDATE: 04/25/1976 , 37  yrs. old GENDER: Female ENDOSCOPIST: R.  Garfield Cornea, MD FACP [facility] REFERRED BY:     Dr. Cruzita Lederer PROCEDURE DATE:  02/25/2014 PROCEDURE:     EGD with bleeding control therapy/band ligation esophageal varices  INDICATIONS:     Hematemesis;  drop in hemoglobin; known cirrhosis and esophageal varices  INFORMED CONSENT:   The risks, benefits, limitations, alternatives and imponderables have been discussed.  The potential for biopsy, esophogeal dilation, etc. have also been reviewed.  Questions have been answered.  All parties agreeable.  Please see the history and physical in the medical record for more information.  MEDICATIONS:     Intubated on Versed and fentanyl sedation protocol  DESCRIPTION OF PROCEDURE:   The     endoscope was introduced through the mouth and advanced to the second portion of the duodenum without difficulty or limitations.  The mucosal surfaces were surveyed very carefully during advancement of the scope and upon withdrawal.  Retroflexion view of the proximal stomach and esophagogastric junction was performed.      FINDINGS:  4 columns grade 2 esophageal varices - one of the columns had a 5 mm erosion overlying it just above the GE junction. Stomach had a large amount of fresh blood/clot contained within it. Diffuse changes of portal gastropathy;  antrum seen well and appeared normal. Patent pylorus. Normal first and second portion of the duodenum.  Utilizing the bio Vac, it took quite a while to suction out the clot and fluid in the proximal stomach to again adequate visualization of the entire gastric mucosa. Eventually, with suctioning and changing of the patient's position, I was able to see the entire gastric mucosa adequately. Again, portal gastropathy with diffuse friability of the  gastric mucosa. No gastric varices. No ulcer or infiltrating process.  THERAPEUTIC / DIAGNOSTIC MANEUVERS PERFORMED:  The decision was made to band the esophageal varices. Scope was withdrawn and the Microvasive 7 shot bander was loaded up on the scope was it was reintroduced into the esophagus; a total of 5 bands were placed  - 2 bands on one the suspect varix and one each on the remaining 3. Good hemostasis was maintained with these maneuvers.   COMPLICATIONS:  None  IMPRESSION:   Esophageal varices with bleeding stigmata-status post esophageal band ligation therapy. Portal gastropathy.  RECOMMENDATIONS:   Patient to remain intubated overnight; if stable tomorrow, move towards extubation. IV Sandostatin for a total of 72 hours. Continue antibiotics for total of 5 days for prophylaxis against infection in the setting of GI bleeding in a cirrhotic.  Resume beta blockade when appropriate. Once daily PPI. Consider cardiology evaluation as previously recommended. Repeat banding session with Dr. Oneida Alar in 2-3 weeks as appropriate.    _______________________________ R. Garfield Cornea, MD FACP [facility] eSigned:  R. Garfield Cornea, MD FACP [facility] 02/28/2014 8:03 AM Revised: 02/28/2014 8:03 AM    CC:  PATIENT NAME:  Landrie, Beale MR#: #[id number]

## 2014-03-01 NOTE — Progress Notes (Addendum)
Subjective:  Complains of pain in substernal lower chest with swallowing. States it started when switched from IV protonix to oral. She questions having reaction to the protonix. Denies abdominal pain. No further GI bleeding.   Objective: Vital signs in last 24 hours: Temp:  [97.3 F (36.3 C)-98.2 F (36.8 C)] 98.2 F (36.8 C) (07/23 0700) Pulse Rate:  [74-95] 95 (07/23 0700) Resp:  [13-26] 26 (07/23 0900) BP: (69-120)/(28-73) 90/51 mmHg (07/23 0900) SpO2:  [96 %-100 %] 100 % (07/23 0700) FiO2 (%):  [21 %] 21 % (07/23 0400) Weight:  [145 lb 1 oz (65.8 kg)-149 lb 14.6 oz (68 kg)] 149 lb 14.6 oz (68 kg) (07/23 0500) Last BM Date: 03/01/14 General:   Alert,  Well-developed, well-nourished, pleasant and cooperative in NAD Head:  Normocephalic and atraumatic. Eyes:  Sclera clear, no icterus.   Abdomen:  Soft, nontender and mild distention. Normal bowel sounds, without guarding, and without rebound.   Extremities:  Without clubbing, deformity. 1+lower extremity edema. Neurologic:  Alert and  oriented x4;  grossly normal neurologically. Skin:  Intact without significant lesions or rashes. Psych:  Alert and cooperative. Normal mood and affect.  Intake/Output from previous day: 07/22 0701 - 07/23 0700 In: 3543 [P.O.:1080; I.V.:2163; IV Piggyback:300] Out: 7939 [Urine:1175; Stool:1] Intake/Output this shift: Total I/O In: 360 [P.O.:360] Out: -   Lab Results: CBC  Recent Labs  02/27/14 1246 02/28/14 0406 03/01/14 0410  WBC 9.0 9.0 7.1  HGB 9.8* 9.4* 8.9*  HCT 29.9* 28.4* 26.5*  MCV 100.0 99.3 99.6  PLT PLATELET CLUMPS NOTED ON SMEAR, COUNT APPEARS ADEQUATE 191 181   BMET  Recent Labs  02/27/14 0303 02/28/14 0406 03/01/14 0410  NA 139 143 143  K 3.6* 3.4* 3.7  CL 103 106 110  CO2 22 23 22   GLUCOSE 99 120* 122*  BUN 46* 39* 28*  CREATININE 1.95* 1.63* 1.23*  CALCIUM 8.1* 8.3* 8.0*   LFTs  Recent Labs  02/27/14 0303 02/28/14 0406  BILITOT 3.5* 2.8*  ALKPHOS  102 108  AST 121* 78*  ALT 16 15  PROT 6.0 6.2  ALBUMIN 2.1* 2.3*   No results found for this basename: LIPASE,  in the last 72 hours PT/INR  Recent Labs  02/27/14 0303 02/28/14 0406  LABPROT 15.4* 15.4*  INR 1.22 1.22      Imaging Studies: US Paracentesis  02/28/2014   CLINICAL DATA:  Ascites  EXAM: ULTRASOUND GUIDED  PARACENTESIS  COMPARISON:  None.  PROCEDURE: An ultrasound guided paracentesis was thoroughly discussed with the patient and questions answered. The benefits, risks, alternatives and complications were also discussed. The patient understands and wishes to proceed with the procedure. Written consent was obtained.  Ultrasound was performed to localize and mark an adequate pocket of fluid in the right lower quadrant of the abdomen. The area was then prepped and draped in the normal sterile fashion. 1% Lidocaine was used for local anesthesia. Under ultrasound guidance a 19 gauge Yueh catheter was introduced. Paracentesis was performed. The catheter was removed and a dressing applied.  Complications: None.  FINDINGS: A total of approximately 6900 mL of clear straw-colored fluid was removed. A fluid sample was sent for laboratory analysis.  IMPRESSION: Successful ultrasound guided paracentesis yielding 6900 mL of ascites.   Electronically Signed   By: Rolm Baptise M.D.   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 Extubated on  7/21. Without any further signs of overt GI bleeding. Hgb stable. Octreotide complete.   Ascites: paracentesis with fluid analysis, cell count- insignificant findings, culture and cytology pending.  AKI: nephrology consulted. Felt to be secondary to diuretics. Improving renal function.   Persistent tachycardia: some improvement. Consider elective cardiology evaluation in future.   Plan: 1. Complete seven days of abx for GI  bleeding/cirrhosis. 2.  3. F/U pending cultures and cytology. 4. Add Carafate slurries qid for esophageal irritation. Likely related to banding. Continue protonix for now.    LOS: 5 days   Neil Crouch  03/01/2014, 12:05 PM   Attending note:  Patient seen and examined this afternoon. Agree with above.

## 2014-03-01 NOTE — Progress Notes (Signed)
Patient refused IV antibiotics stating that the doctor said she didn't have to get anymore doses.  I explained to the patient that there was no notation of the discontinuation of her antibiotics in any of the doctors notes and that it was still ordered for her to get.  Patient stated that she feels like she doesn't need it because the doctor said she didn't.  Will pass on to the day dime RN and will paged the midlevel MD to make them aware.

## 2014-03-01 NOTE — Progress Notes (Signed)
Pt a/o.vss. IV patent. No complaints of any distress at this time. Report called to M.Bullins, Therapist, sports. Pt to  Be transferred to room 331 via wheelchair by nursing staff.

## 2014-03-01 NOTE — Progress Notes (Signed)
[facility] PROGRESS NOTE  Wanda [name] HYI:[id number] DOB: 1975-10-11 DOA: 02/24/2014 PCP: No PCP Per Patient    Code Status: Full code Family Communication: Family not available Disposition Plan: Discharge to home when clinically appropriate, possibly in 24-48 hours.   Consultants:  Gastroenterology  Nephrology  Procedures:  Therapeutic paracentesis, 7/22 yielding 6.9 L.  Intubation 7/19 for airway protection, extubated 7/21  EGD, Dr. Gala Romney 7/19  Insertion of right femoral vein central line on 7/19, Dr. Arnoldo Morale.  Antibiotics:  Rocephin 7/18>>  HPI/Subjective: The patient feels better than she did yesterday, less abdominal tightness. No complaints of nausea, vomiting, or abdominal pain. She had no bowel movements overnight.  Objective: Filed Vitals:   03/01/14 0700  BP: 109/73  Pulse: 95  Temp: 98.2 F (36.8 C)  Resp: 17    Oxygen saturation 100% on room air.  Intake/Output Summary (Last 24 hours) at 03/01/14 0803 Last data filed at 03/01/14 0600  Gross per 24 hour  Intake   3418 ml  Output   7726 ml  Net  -4308 ml   Filed Weights   02/28/14 0500 02/28/14 1206 03/01/14 0500  Weight: 69.9 kg (154 lb 1.6 oz) 65.8 kg (145 lb 1 oz) 68 kg (149 lb 14.6 oz)    Exam:   General:  38 year old woman in no acute distress.  Cardiovascular: S1, S2, no murmurs rubs or gallops.  Respiratory: Rare crackles in the bases, otherwise clear anteriorly. Breathing nonlabored.  Abdomen: Positive bowel sounds, significantly less distended, minimal fluid wave, nontender, no masses palpated.  Musculoskeletal: Trace pedal edema. No acute hot joints.  Neurologic: She is alert and oriented x3. Cranial nerves II through XII are grossly intact.   Data Reviewed: Basic Metabolic Panel:  Recent Labs Lab 02/25/14 0533 02/26/14 0500 02/27/14 0303 02/28/14 0406 03/01/14 0410  NA 139 142 139 143 143  K 3.3* 3.1* 3.6* 3.4* 3.7  CL 98 104 103 106 110  CO2 28 25 22  23 22   GLUCOSE 118* 94 99 120* 122*  BUN 47* 46* 46* 39* 28*  CREATININE 1.15* 1.49* 1.95* 1.63* 1.23*  CALCIUM 8.2* 7.9* 8.1* 8.3* 8.0*  MG  --  1.5 1.6 1.4*  --   PHOS  --  3.4 3.2 3.5  --    Liver Function Tests:  Recent Labs Lab 02/24/14 2105 02/25/14 0533 02/26/14 0500 02/27/14 0303 02/28/14 0406  AST 68* 50* 169* 121* 78*  ALT 15 11 17 16 15   ALKPHOS 156* 118* 106 102 108  BILITOT 3.0* 2.5* 2.7* 3.5* 2.8*  PROT 7.3 5.9* 5.8* 6.0 6.2  ALBUMIN 2.7* 2.1* 2.0* 2.1* 2.3*    Recent Labs Lab 02/24/14 2115  LIPASE 36    Recent Labs Lab 02/24/14 2115 02/28/14 1049  AMMONIA 44 31   CBC:  Recent Labs Lab 02/24/14 2105  02/27/14 0303 02/27/14 0716 02/27/14 1246 02/28/14 0406 03/01/14 0410  WBC 10.2  < > 8.1 7.4 9.0 9.0 7.1  NEUTROABS 7.3  --   --   --   --   --   --   HGB 10.1*  < > 9.5* 9.3* 9.8* 9.4* 8.9*  HCT 30.4*  < > 28.5* 28.7* 29.9* 28.4* 26.5*  MCV 102.4*  < > 99.0 99.0 100.0 99.3 99.6  PLT 212  < > 153 160 PLATELET CLUMPS NOTED ON SMEAR, COUNT APPEARS ADEQUATE 191 181  < > = values in this interval not displayed. Cardiac Enzymes:  Recent Labs Lab 02/24/14 2115  TROPONINI <  0.30   BNP (last 3 results) No results found for this basename: PROBNP,  in the last 8760 hours CBG: No results found for this basename: GLUCAP,  in the last 168 hours  Recent Results (from the past 240 hour(s))  CULTURE, BLOOD (ROUTINE X 2)     Status: None   Collection Time    02/24/14 11:11 PM      Result Value Ref Range Status   Specimen Description BLOOD RIGHT ANTECUBITAL DRAWN BY RN   Final   Special Requests BOTTLES DRAWN AEROBIC ONLY 12CC   Final   Culture NO GROWTH 4 DAYS   Final   Report Status PENDING   Incomplete  CULTURE, BLOOD (ROUTINE X 2)     Status: None   Collection Time    02/24/14 11:11 PM      Result Value Ref Range Status   Specimen Description BLOOD LEFT ANTECUBITAL DRAWN BY RN   Final   Special Requests BOTTLES DRAWN AEROBIC ONLY 8CC   Final    Culture NO GROWTH 4 DAYS   Final   Report Status PENDING   Incomplete  MRSA PCR SCREENING     Status: None   Collection Time    02/25/14  1:13 AM      Result Value Ref Range Status   MRSA by PCR NEGATIVE  NEGATIVE Final   Comment:            The GeneXpert MRSA Assay (FDA     approved for NASAL specimens     only), is one component of a     comprehensive MRSA colonization     surveillance program. It is not     intended to diagnose MRSA     infection nor to guide or     monitor treatment for     MRSA infections.  BODY FLUID CULTURE     Status: None   Collection Time    02/28/14 11:15 AM      Result Value Ref Range Status   Specimen Description ASCITIC FLUID   Final   Special Requests NONE   Final   Gram Stain     Final   Value: RARE WBC PRESENT, PREDOMINANTLY MONONUCLEAR     NO ORGANISMS SEEN     Performed at [facility]   Culture PENDING   Incomplete   Report Status PENDING   Incomplete     Studies: US Paracentesis  02/28/2014   CLINICAL DATA:  Ascites  EXAM: ULTRASOUND GUIDED  PARACENTESIS  COMPARISON:  None.  PROCEDURE: An ultrasound guided paracentesis was thoroughly discussed with the patient and questions answered. The benefits, risks, alternatives and complications were also discussed. The patient understands and wishes to proceed with the procedure. Written consent was obtained.  Ultrasound was performed to localize and mark an adequate pocket of fluid in the right lower quadrant of the abdomen. The area was then prepped and draped in the normal sterile fashion. 1% Lidocaine was used for local anesthesia. Under ultrasound guidance a 19 gauge Yueh catheter was introduced. Paracentesis was performed. The catheter was removed and a dressing applied.  Complications: None.  FINDINGS: A total of approximately 6900 mL of clear straw-colored fluid was removed. A fluid sample was sent for laboratory analysis.  IMPRESSION: Successful ultrasound guided paracentesis yielding  6900 mL of ascites.   Electronically Signed   By: Rolm Baptise M.D.   On: 02/28/2014 12:48    Scheduled Meds: . antiseptic oral rinse  15 mL  Mouth Rinse QID  . cefTRIAXone (ROCEPHIN)  IV  1 g Intravenous Q12H  . lactose free nutrition  237 mL Oral BID BM  . pantoprazole  40 mg Oral BID AC  . sodium chloride  10-40 mL Intracatheter Q12H  . sodium chloride  3 mL Intravenous Q12H   Continuous Infusions: . sodium chloride 75 mL/hr at 03/01/14 0645    Assessment and plan:  Principal Problem:   Hematemesis/vomiting blood Active Problems:   Bleeding esophageal varices   Ascites   Sinus tachycardia   Cirrhosis   Hypokalemia   Acute renal injury   Acute blood loss anemia   Macrocytosis   1. Upper GI bleed secondary to esophageal varices. Status post EGD on 7/19. (Awaiting the official report) status post octreotide time 72 hours and IV Protonix. Now on oral Protonix. Bleeding appears to have stopped. Continue to monitor. Appreciate GI assistance.  Abdominal ascites, secondary to [place] cirrhosis. Paracentesis on 7/22 yielded 6.9 L of ascitic fluid. Gram stain revealed no organisms and rare WBCs. Cell count revealed 114 WBCs with 37% neutrophils. We'll continue Rocephin empirically. She has mild peripheral edema so Zaroxolyn may need to be restarted. Will defer this to nephrology as her renal function has improved.  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 We'll continue supportive treatment. Lactulose and Zaroxolyn are on hold, but no signs of hepatic encephalopathy. Her ammonia level is within normal limits at 31. We'll defer starting lactulose to GI.  Acute blood loss anemia superimposed on chronic anemia. Status post transfusion on 7/20 when her hemoglobin gradually fell from 10.1 on admission to a nadir of 7.6. Hemoglobin is stabilizing between 9 and 9.4.. Continue to monitor  Acute renal failure. Creatinine 1.03 on admission and reached a high of 1.95.  Etiology possibly ATN versus prerenal azotemia from blood loss. Nephrology is assisting with management, appreciate the assistance. She is being hydrated with IV fluids. Currently, Zaroxolyn is on hold, but may need to be restarted. Her creatinine has improved significantly.  Macrocytosis. Likely secondary to cirrhosis. Both her vitamin B12 and folate levels were within normal limits March 2015.  Chronic sinus tachycardia. Acute tachycardia likely secondary to volume depletion and blood loss. TSH in April 2015 was modestly elevated at 7, but free T4 was within normal limits.  Hypokalemia. Her magnesium level was borderline low. This is being supplemented cautiously. She is status post 1 g of magnesium sulfate IV. Her serum potassium is better.    We'll transfer the patient out of the ICU. Last nursing staff to ambulate the patient. Consider discharge over the next 24-48 hours.   Time spent: 30 minutes    [facility] Pager [phone]. If 7PM-7AM, please contact night-coverage at [email or web address], password [email or web address] 03/01/2014, 8:03 AM  LOS: 5 days

## 2014-03-02 ENCOUNTER — Telehealth: Payer: Self-pay | Admitting: Gastroenterology

## 2014-03-02 ENCOUNTER — Encounter (HOSPITAL_COMMUNITY): Payer: Self-pay | Admitting: Internal Medicine

## 2014-03-02 LAB — CBC
HCT: 30.4 % — ABNORMAL LOW (ref 36.0–46.0)
Hemoglobin: 10.2 g/dL — ABNORMAL LOW (ref 12.0–15.0)
MCH: 33.1 pg (ref 26.0–34.0)
MCHC: 33.6 g/dL (ref 30.0–36.0)
MCV: 98.7 fL (ref 78.0–100.0)
PLATELETS: 223 10*3/uL (ref 150–400)
RBC: 3.08 MIL/uL — AB (ref 3.87–5.11)
RDW: 20 % — ABNORMAL HIGH (ref 11.5–15.5)
WBC: 7.8 10*3/uL (ref 4.0–10.5)

## 2014-03-02 LAB — BASIC METABOLIC PANEL
Anion gap: 12 (ref 5–15)
BUN: 21 mg/dL (ref 6–23)
CHLORIDE: 108 meq/L (ref 96–112)
CO2: 24 meq/L (ref 19–32)
Calcium: 8.6 mg/dL (ref 8.4–10.5)
Creatinine, Ser: 1.19 mg/dL — ABNORMAL HIGH (ref 0.50–1.10)
GFR calc non Af Amer: 57 mL/min — ABNORMAL LOW (ref >90)
GFR, EST AFRICAN AMERICAN: 66 mL/min — AB (ref >90)
Glucose, Bld: 119 mg/dL — ABNORMAL HIGH (ref 70–99)
POTASSIUM: 3.2 meq/L — AB (ref 3.7–5.3)
Sodium: 144 mEq/L (ref 137–147)

## 2014-03-02 MED ORDER — OXYCODONE HCL 5 MG PO TABS: 5.0000 mg | ORAL_TABLET | ORAL | Status: DC | PRN

## 2014-03-02 MED ORDER — TORSEMIDE 20 MG PO TABS: 40.0000 mg | ORAL_TABLET | Freq: Every day | ORAL | Status: DC

## 2014-03-02 MED ORDER — SPIRONOLACTONE 25 MG PO TABS: 25.0000 mg | ORAL_TABLET | Freq: Every day | ORAL | Status: DC

## 2014-03-02 MED ORDER — BOOST PLUS PO LIQD: 237.0000 mL | Freq: Two times a day (BID) | ORAL | Status: DC

## 2014-03-02 MED ORDER — PANTOPRAZOLE SODIUM 40 MG PO TBEC: 40.0000 mg | DELAYED_RELEASE_TABLET | Freq: Two times a day (BID) | ORAL | Status: DC

## 2014-03-02 MED ORDER — POTASSIUM CHLORIDE 20 MEQ/15ML (10%) PO LIQD
40.0000 meq | Freq: Two times a day (BID) | ORAL | Status: DC
  Administered 2014-03-02: 40 meq via ORAL
  Filled 2014-03-02: qty 30

## 2014-03-02 MED ORDER — POTASSIUM CHLORIDE 20 MEQ/15ML (10%) PO LIQD: 20.0000 meq | Freq: Two times a day (BID) | ORAL | Status: DC

## 2014-03-02 MED ORDER — SUCRALFATE 1 GM/10ML PO SUSP: 1.0000 g | Freq: Three times a day (TID) | ORAL | Status: DC

## 2014-03-02 MED ORDER — SPIRONOLACTONE 25 MG PO TABS
25.0000 mg | ORAL_TABLET | Freq: Every day | ORAL | Status: DC
  Administered 2014-03-02: 25 mg via ORAL
  Filled 2014-03-02: qty 1

## 2014-03-02 NOTE — Progress Notes (Signed)
UR chart review completed.  

## 2014-03-02 NOTE — Progress Notes (Signed)
Pt states her pain medication is not working.  Pt already spoken with MD concerning this and waiting for medication changes.  Pt states if the doctor does not change it then just keep it the way it is.  Pt consumed 100% of breakfast without c/o o nausea.

## 2014-03-02 NOTE — Progress Notes (Signed)
Subjective:  Concerned about swelling. No further bleeding. Tolerating diet.   Objective: Vital signs in last 24 hours: Temp:  [98.2 F (36.8 C)-98.6 F (37 C)] 98.6 F (37 C) (07/24 0546) Pulse Rate:  [84-88] 84 (07/24 0546) Resp:  [16-26] 18 (07/24 0546) BP: (90-101)/(43-67) 101/55 mmHg (07/24 0546) SpO2:  [100 %] 100 % (07/24 0546) Last BM Date: 03/01/14    Weight overall down 5 pounds since 02/28/14. General:   Alert,  Well-developed, chronically-ill appearing, pleasant and cooperative in NAD Head:  Normocephalic and atraumatic. Eyes:  Sclera clear, no icterus.   Abdomen:  Soft, nontender, moderate distention. Normal bowel sounds, without guarding, and without rebound.   Extremities:  Without clubbing, deformity. 2+lower ext edema. Neurologic:  Alert and  oriented x4;  grossly normal neurologically. Skin:  Intact without significant lesions or rashes. Psych:  Alert and cooperative. Normal mood and affect.  Intake/Output from previous day: 07/23 0701 - 07/24 0700 In: 600 [P.O.:600] Out: 1200 [Urine:1200] Intake/Output this shift:    Lab Results: CBC  Recent Labs  02/28/14 0406 03/01/14 0410 03/02/14 0534  WBC 9.0 7.1 7.8  HGB 9.4* 8.9* 10.2*  HCT 28.4* 26.5* 30.4*  MCV 99.3 99.6 98.7  PLT 191 181 223   BMET  Recent Labs  02/28/14 0406 03/01/14 0410 03/02/14 0534  NA 143 143 144  K 3.4* 3.7 3.2*  CL 106 110 108  CO2 23 22 24   GLUCOSE 120* 122* 119*  BUN 39* 28* 21  CREATININE 1.63* 1.23* 1.19*  CALCIUM 8.3* 8.0* 8.6   LFTs  Recent Labs  02/28/14 0406  BILITOT 2.8*  ALKPHOS 108  AST 78*  ALT 15  PROT 6.2  ALBUMIN 2.3*   No results found for this basename: LIPASE,  in the last 72 hours PT/INR  Recent Labs  02/28/14 0406  LABPROT 15.4*  INR 1.22      Imaging Studies: US Paracentesis  02/28/2014   CLINICAL DATA:  Ascites  EXAM: ULTRASOUND GUIDED  PARACENTESIS  COMPARISON:  None.  PROCEDURE: An ultrasound guided paracentesis was  thoroughly discussed with the patient and questions answered. The benefits, risks, alternatives and complications were also discussed. The patient understands and wishes to proceed with the procedure. Written consent was obtained.  Ultrasound was performed to localize and mark an adequate pocket of fluid in the right lower quadrant of the abdomen. The area was then prepped and draped in the normal sterile fashion. 1% Lidocaine was used for local anesthesia. Under ultrasound guidance a 19 gauge Yueh catheter was introduced. Paracentesis was performed. The catheter was removed and a dressing applied.  Complications: None.  FINDINGS: A total of approximately 6900 mL of clear straw-colored fluid was removed. A fluid sample was sent for laboratory analysis.  IMPRESSION: Successful ultrasound guided paracentesis yielding 6900 mL of ascites.   Electronically Signed   By: Rolm Baptise M.D.   On: 02/28/2014 12:48     Assessment:  38 year old female with NASH cirrhosis admitted with variceal bleed, s/p EGD on 7/19. Without any further signs of overt GI bleeding. Hgb stable. Octreotide complete. Some discomfort related to banding.  Ascites: paracentesis with fluid analysis, cell count- insignificant findings, culture and cytology (no malignancy).   AKI: nephrology consulted. Felt to be secondary to diuretics. Improving renal function.   Persistent tachycardia: some improvement. Consider elective cardiology evaluation in future.    Plan: 1. Complete five days of abx for GI bleeding/cirrhosis. 2. Continue carafate slurries for 7 days.  3.  Continue PPI. 4. F/U cultures. 5. EGD with repeat EV banding in 2-3 weeks with Dr. Oneida Alar.    LOS: 6 days   Neil Crouch  03/02/2014, 7:53 AM

## 2014-03-02 NOTE — Progress Notes (Signed)
Subjective: Interval History: has no complaint of difficulty in swallowing. Her appetite is ok  Objective: Vital signs in last 24 hours: Temp:  [98.2 F (36.8 C)-98.6 F (37 C)] 98.6 F (37 C) (07/24 0546) Pulse Rate:  [84-88] 84 (07/24 0546) Resp:  [16-18] 18 (07/24 0546) BP: (94-101)/(43-55) 101/55 mmHg (07/24 0546) SpO2:  [100 %] 100 % (07/24 0546) Weight change:   Intake/Output from previous day: 07/23 0701 - 07/24 0700 In: 600 [P.O.:600] Out: 1200 [Urine:1200] Intake/Output this shift:    Generally: Patient is alert in no apparent distress Chest is clear Heart :RRR Abdomen: is distended but none tender Extremities: She has 1+ edema  Lab Results:  Recent Labs  03/01/14 0410 03/02/14 0534  WBC 7.1 7.8  HGB 8.9* 10.2*  HCT 26.5* 30.4*  PLT 181 223   BMET:   Recent Labs  03/01/14 0410 03/02/14 0534  NA 143 144  K 3.7 3.2*  CL 110 108  CO2 22 24  GLUCOSE 122* 119*  BUN 28* 21  CREATININE 1.23* 1.19*  CALCIUM 8.0* 8.6   No results found for this basename: PTH,  in the last 72 hours Iron Studies: No results found for this basename: IRON, TIBC, TRANSFERRIN, FERRITIN,  in the last 72 hours  Studies/Results: US Paracentesis  02/28/2014   CLINICAL DATA:  Ascites  EXAM: ULTRASOUND GUIDED  PARACENTESIS  COMPARISON:  None.  PROCEDURE: An ultrasound guided paracentesis was thoroughly discussed with the patient and questions answered. The benefits, risks, alternatives and complications were also discussed. The patient understands and wishes to proceed with the procedure. Written consent was obtained.  Ultrasound was performed to localize and mark an adequate pocket of fluid in the right lower quadrant of the abdomen. The area was then prepped and draped in the normal sterile fashion. 1% Lidocaine was used for local anesthesia. Under ultrasound guidance a 19 gauge Yueh catheter was introduced. Paracentesis was performed. The catheter was removed and a dressing applied.   Complications: None.  FINDINGS: A total of approximately 6900 mL of clear straw-colored fluid was removed. A fluid sample was sent for laboratory analysis.  IMPRESSION: Successful ultrasound guided paracentesis yielding 6900 mL of ascites.   Electronically Signed   By: Rolm Baptise M.D.   On: 02/28/2014 12:48    I have reviewed the patient's current medications.  Assessment/Plan: Problem #1 renal failure: Acute on chronic. Possibly secondary to fluid removal. Presently her BUN and creatinine is improving and has returned to her base line Problem #2 hypokalemia potassium has declined again possibly from diuretic. Problem #3 anemia: H&H has declined slightly. Patient with history of GI bleeding Problem #4 anasarca: Status post paracentesis. Patient presently her leg swelling seems to be increasing but denies any difficulty in breathing. Problem #5 liver cirrhosis Problem #6 bipolar disorder Plan: Will start on kcl elixer 40 mg po bid We will increase her  Demadex 40 mg by mouth once a day on when necessary  Add Aldactone 25 mg po once a day   LOS: 6 days   Gayna Braddy S 03/02/2014,10:14 AM

## 2014-03-02 NOTE — Care Management Note (Unsigned)
    Page 1 of 1   03/02/2014     2:04:36 PM CARE MANAGEMENT NOTE 03/02/2014  Patient:  Wanda [name], Wanda [name]   Account Number:  [id number]  Date Initiated:  02/26/2014  Documentation initiated by:  [facility]  Subjective/Objective Assessment:   38 yo with hx NASH cirrhosis adm thru ED with variceal bleed. IPTA at home  with family support.     Action/Plan:   CM will follow for DC planning needs.   Anticipated DC Date:  03/02/2014   Anticipated DC Plan:  HOME/SELF CARE         Choice offered to / List presented to:             Status of service:  Completed, signed off Medicare Important Message given?  YES (If response is "NO", the following Medicare IM given date fields will be blank) Date Medicare IM given:  03/02/2014 Medicare IM given by:  Theophilus Kinds Date Additional Medicare IM given:   Additional Medicare IM given by:    Discharge Disposition:  HOME/SELF CARE  Per UR Regulation:  Reviewed for med. necessity/level of care/duration of stay  If discussed at [place] of Stay Meetings, dates discussed:   03/01/2014    Comments:  03/02/14 1400 Christinia Gully, RN BSN CM Pt discharged home today. No CM needs noted.

## 2014-03-02 NOTE — Discharge Summary (Signed)
Physician Discharge Summary  Wanda [name] ZDG:[id number] DOB: 11-14-1975 DOA: 02/24/2014  PCP: No PCP Per Patient  Admit date: 02/24/2014 Discharge date: 03/02/2014  Time spent: Greater than 30 minutes  Recommendations for Outpatient Follow-up:  1. Recommend followup of the patient's serum potassium and renal function.  2. Recommend rechecking her hemoglobin and hematocrit at the followup appointments.   Discharge Diagnoses:   1. Upper GI bleed, secondary to bleeding esophageal varices. Status post bleeding controlled therapy/band ligation on 02/25/14. 2. Acute blood loss anemia, status post 2 units of packed red blood cell transfusions. Hemoglobin fell to a nadir of 7.6. Hemoglobin 10.2 at the time of discharge. 3. Acute renal failure secondary to prerenal azotemia from blood loss. Creatinine reached a high of 1.95. Creatinine at the time of discharge was 1.19. 4. NASH cirrhosis. 5. Recurrent abdominal ascites, secondary to cirrhosis. Status post paracentesis yielding 6.9 L of fluid. 6. Macrocytosis. Vitamin B12 level and folate level were within normal limits in March 2015. 7. Acute on chronic tachycardia. Resolved. Free T4 was within normal limits in April 2015. 8. Hypokalemia, secondary to home diuretic without potassium chloride supplementation. 9. Borderline low hypomagnesemia. Status post repletion with IV magnesium sulfate.  Discharge Condition: Improved.  Diet recommendation: Soft/heart healthy.  Filed Weights   02/28/14 0500 02/28/14 1206 03/01/14 0500  Weight: 69.9 kg (154 lb 1.6 oz) 65.8 kg (145 lb 1 oz) 68 kg (149 lb 14.6 oz)    History of present illness:   The patient is a 38 year old woman with a history of non-alcoholic liver cirrhosis, recurrent ascites, esophageal varices and portal hypertension who presented to the emergency department on 02/24/14 with a chief complaint of vomiting blood. In the ED, she was afebrile and tachycardic with a heart rate ranging from  117-138. She was oxygenating 93-100%. Her blood pressure was in the 951O systolically. Her lab data were significant for a hemoglobin of 10.1, MCV of 102.4, normal troponin I., normal lactic acid level, low serum potassium of 3.0, normal lipase of 36, elevated AST of 68, and elevated total bilirubin of 3.0. She was admitted for further evaluation and management.   Hospital Course:   1. Upper GI bleed secondary to esophageal varices.  Gastroenterology was consulted in the emergency department. She was started on treatment with IV Protonix and octreotide drip. She was hydrated with IV fluids. Her vital signs and hematocrit/hemoglobin were monitored closely. She was made to be n.p.o. Gastroenterologist, Dr. Gala Romney evaluated her and performed an EGD on 7/19. The EGD revealed esophageal varices with bleeding stigmata and portal gastropathy. Bleeding therapy was obtained with esophageal band ligation. She was continued on the octreotide drip for 72 hours. Carafate was added. Protonix was changed to by mouth. The bleeding had resolved. Her diet was advanced. She remained stable. She was discharged on 7 more days of Carafate and Protonix indefinitely. She will followup with Dr. Oneida Alar in 2-3 weeks  Abdominal ascites, secondary to [facility] cirrhosis.  The patient was noted to have significant ascites on exam. Zaroxolyn was withheld because of low-normal blood pressures and hypovolemia. Paracentesis was ordered and it  yielded 6.9 L of ascitic fluid. Gram stain revealed no organisms and rare WBCs. Cell count revealed 114 WBCs with 37% neutrophils. Rocephin was started empirically and continued for a total of 6 days of therapy. She remained afebrile and her white blood cell count was within normal limits at the time of discharge. Torsemide was started and continued following an increase in peripheral edema from  volume resuscitation. Aldactone was added subsequently. She was discharged on torsemide and Aldactone as  recommended by nephrologist, Dr. Lowanda Foster. NASH cirrhosis.  She was noted to have elevated liver transaminases and hyperbilirubinemia. They trended downward. Lactulose was withheld temporarily. Her ammonia level was within normal limits at 31. There were no signs of hepatic encephalopathy. Lactulose was resumed upon discharge.  Acute blood loss anemia superimposed on chronic anemia.  The patient's hemoglobin fell to a nadir of 7.6. She was transfused 2 units of packed red blood cells. Her hemoglobin waxed and waned, but stabilized at 10.2 at the time of discharge.  Acute renal failure.  The patient's creatinine was 1.03 on admission and reached a high of 1.95. Etiology was possibly ATN versus prerenal azotemia from blood loss and hypovolemia/hypoperfusion. Zaroxolyn was withheld. Nephrology was consulted and assisted with management. IV fluids were given and adjusted for several days. When she developed mild peripheral edema, the IV fluids were discontinued. Demadex and then Aldactone were started. Her creatinine improved and stabilized at 1.19 at the time of discharge. She will followup with nephrologist Dr. Lowanda Foster in one to 2 weeks as scheduled.   Macrocytosis.  Likely secondary to cirrhosis. Both her vitamin B12 and folate levels were within normal limits March 2015.  Chronic sinus tachycardia.  Acute tachycardia was likely secondary to volume depletion and blood loss. TSH in April 2015 was modestly elevated at 7, but her free T4 was within normal limits. Her tachycardia resolved at the time of discharge. Hypokalemia.  Her hypokalemia on admission was thought to be secondary to diuretic therapy without potassium chloride supplementation. She was repleted intravenously initially, and then orally after she was able to take oral medications. Her magnesium level was assessed and was borderline low. She was given 1 g of magnesium sulfate. She was discharged on twice a day dosing of potassium chloride.      Procedures: -Therapeutic paracentesis, 7/22 yielding 6.9 L. - Intubation 7/19 for airway protection, extubated 7/21. -EGD with bleeding controlled therapy/band ligation esophageal varices Dr. Gala Romney 7/19  -Insertion of right femoral vein central line on 7/19, Dr. Arnoldo Morale. Discontinued.   Consultations:  Gastroenterology  Nephrology  Discharge Exam: Filed Vitals:   03/02/14 0546  BP: 101/55  Pulse: 84  Temp: 98.6 F (37 C)  Resp: 18   General: 38 year old woman in no acute distress.  Cardiovascular: S1, S2, no murmurs rubs or gallops.  Respiratory: Rare crackles in the bases, otherwise clear anteriorly. Breathing nonlabored.  Abdomen: Positive bowel sounds, significantly less distended, minimal fluid wave, nontender, no masses palpated.  Musculoskeletal: Trace pedal edema. No acute hot joints.  Neurologic: She is alert and oriented x3. Cranial nerves II through XII are grossly intact.     Discharge Instructions You were cared for by a hospitalist during your hospital stay. If you have any questions about your discharge medications or the care you received while you were in the hospital after you are discharged, you can call the unit and asked to speak with the hospitalist on call if the hospitalist that took care of you is not available. Once you are discharged, your primary care physician will handle any further medical issues. Please note that NO REFILLS for any discharge medications will be authorized once you are discharged, as it is imperative that you return to your primary care physician (or establish a relationship with a primary care physician if you do not have one) for your aftercare needs so that they can reassess your need for  medications and monitor your lab values.  Discharge Instructions   Diet - low sodium heart healthy    Complete by:  As directed      Discharge instructions    Complete by:  As directed   Take medications as prescribed. Follow up with  your doctors.     Increase activity slowly    Complete by:  As directed             Medication List    STOP taking these medications       metolazone 5 MG tablet  Commonly known as:  ZAROXOLYN      TAKE these medications       lactose free nutrition Liqd  Take 237 mLs by mouth 2 (two) times daily between meals.     lactulose 10 GM/15ML solution  Commonly known as:  CHRONULAC  Take 15 mLs (10 g total) by mouth every other day.     oxyCODONE 5 MG immediate release tablet  Commonly known as:  Oxy IR/ROXICODONE  Take 1 tablet (5 mg total) by mouth every 4 (four) hours as needed for severe pain.     pantoprazole 40 MG tablet  Commonly known as:  PROTONIX  Take 1 tablet (40 mg total) by mouth 2 (two) times daily before a meal. For management of GI bleeding.     potassium chloride 20 MEQ/15ML (10%) solution  Take 15 mLs (20 mEq total) by mouth 2 (two) times daily.     spironolactone 25 MG tablet  Commonly known as:  ALDACTONE  Take 1 tablet (25 mg total) by mouth daily. For treatment of swelling     sucralfate 1 GM/10ML suspension  Commonly known as:  CARAFATE  Take 10 mLs (1 g total) by mouth 4 (four) times daily -  with meals and at bedtime. Take for 7 more days for management of GI bleeding.     torsemide 20 MG tablet  Commonly known as:  DEMADEX  Take 2 tablets (40 mg total) by mouth daily. Take for treatment of swelling.  Start taking on:  03/03/2014       No Known Allergies     Follow-up Information   Follow up with [facility], [place] On 03/14/2014. (At 2:00 pm)    Specialty:  Nephrology   Contact information:   [address] [address] [phone]       Follow up with Barney Drain, MD. (Dr. Oneida Alar office will call you with the followup appointment.)    Specialty:  Gastroenterology   Contact information:   [place] [address] [place]  [address] [phone]        The results of significant  diagnostics from this hospitalization (including imaging, microbiology, ancillary and laboratory) are listed below for reference.    Significant Diagnostic Studies: US Paracentesis  02/28/2014   CLINICAL DATA:  Ascites  EXAM: ULTRASOUND GUIDED  PARACENTESIS  COMPARISON:  None.  PROCEDURE: An ultrasound guided paracentesis was thoroughly discussed with the patient and questions answered. The benefits, risks, alternatives and complications were also discussed. The patient understands and wishes to proceed with the procedure. Written consent was obtained.  Ultrasound was performed to localize and mark an adequate pocket of fluid in the right lower quadrant of the abdomen. The area was then prepped and draped in the normal sterile fashion. 1% Lidocaine was used for local anesthesia. Under ultrasound guidance a 19 gauge Yueh catheter was introduced. Paracentesis was performed. The  catheter was removed and a dressing applied.  Complications: None.  FINDINGS: A total of approximately 6900 mL of clear straw-colored fluid was removed. A fluid sample was sent for laboratory analysis.  IMPRESSION: Successful ultrasound guided paracentesis yielding 6900 mL of ascites.   Electronically Signed   By: Rolm Baptise M.D.   On: 02/28/2014 12:48   Dg Chest Port 1 View  02/25/2014   CLINICAL DATA:  Status post intubation.  EXAM: PORTABLE CHEST - 1 VIEW  COMPARISON:  Chest x-ray earlier today.  FINDINGS: Endotracheal tube is been placed with the tip approximately 2.5 cm above the carina and in good position. Lungs show mild bibasilar atelectasis. There is no evidence of pulmonary edema, consolidation, pneumothorax, nodule or pleural fluid. The heart size is stable and within normal limits.  IMPRESSION: Appropriate positioning of endotracheal tube. Bibasilar atelectasis without acute pulmonary process identified.   Electronically Signed   By: Aletta Edouard M.D.   On: 02/25/2014 13:26   Dg Abd Acute W/chest  02/24/2014    CLINICAL DATA:  Abdominal pain, pressure  EXAM: ACUTE ABDOMEN SERIES (ABDOMEN 2 VIEW & CHEST 1 VIEW)  COMPARISON:  None.  FINDINGS: There is no evidence of dilated bowel loops or free intraperitoneal air. No radiopaque calculi or other significant radiographic abnormality is seen. Heart size and mediastinal contours are within normal limits. Both lungs are clear.  IMPRESSION: Negative abdominal radiographs.  No acute cardiopulmonary disease.   Electronically Signed   By: Kathreen Devoid   On: 02/24/2014 22:50    Microbiology: Recent Results (from the past 240 hour(s))  CULTURE, BLOOD (ROUTINE X 2)     Status: None   Collection Time    02/24/14 11:11 PM      Result Value Ref Range Status   Specimen Description BLOOD RIGHT ANTECUBITAL DRAWN BY RN   Final   Special Requests BOTTLES DRAWN AEROBIC ONLY 12CC   Final   Culture NO GROWTH 5 DAYS   Final   Report Status 03/01/2014 FINAL   Final  CULTURE, BLOOD (ROUTINE X 2)     Status: None   Collection Time    02/24/14 11:11 PM      Result Value Ref Range Status   Specimen Description BLOOD LEFT ANTECUBITAL DRAWN BY RN   Final   Special Requests BOTTLES DRAWN AEROBIC ONLY 8CC   Final   Culture NO GROWTH 5 DAYS   Final   Report Status 03/01/2014 FINAL   Final  MRSA PCR SCREENING     Status: None   Collection Time    02/25/14  1:13 AM      Result Value Ref Range Status   MRSA by PCR NEGATIVE  NEGATIVE Final   Comment:            The GeneXpert MRSA Assay (FDA     approved for NASAL specimens     only), is one component of a     comprehensive MRSA colonization     surveillance program. It is not     intended to diagnose MRSA     infection nor to guide or     monitor treatment for     MRSA infections.  BODY FLUID CULTURE     Status: None   Collection Time    02/28/14 11:15 AM      Result Value Ref Range Status   Specimen Description ASCITIC FLUID   Final   Special Requests NONE   Final   Gram Stain  Final   Value: RARE WBC PRESENT,  PREDOMINANTLY MONONUCLEAR     NO ORGANISMS SEEN     Performed at [facility]   Culture     Final   Value: NO GROWTH 1 DAY     Performed at [facility]   Report Status PENDING   Incomplete     Labs: Basic Metabolic Panel:  Recent Labs Lab 02/26/14 0500 02/27/14 0303 02/28/14 0406 03/01/14 0410 03/02/14 0534  NA 142 139 143 143 144  K 3.1* 3.6* 3.4* 3.7 3.2*  CL 104 103 106 110 108  CO2 25 22 23 22 24   GLUCOSE 94 99 120* 122* 119*  BUN 46* 46* 39* 28* 21  CREATININE 1.49* 1.95* 1.63* 1.23* 1.19*  CALCIUM 7.9* 8.1* 8.3* 8.0* 8.6  MG 1.5 1.6 1.4*  --   --   PHOS 3.4 3.2 3.5  --   --    Liver Function Tests:  Recent Labs Lab 02/24/14 2105 02/25/14 0533 02/26/14 0500 02/27/14 0303 02/28/14 0406  AST 68* 50* 169* 121* 78*  ALT 15 11 17 16 15   ALKPHOS 156* 118* 106 102 108  BILITOT 3.0* 2.5* 2.7* 3.5* 2.8*  PROT 7.3 5.9* 5.8* 6.0 6.2  ALBUMIN 2.7* 2.1* 2.0* 2.1* 2.3*    Recent Labs Lab 02/24/14 2115  LIPASE 36    Recent Labs Lab 02/24/14 2115 02/28/14 1049  AMMONIA 44 31   CBC:  Recent Labs Lab 02/24/14 2105  02/27/14 0716 02/27/14 1246 02/28/14 0406 03/01/14 0410 03/02/14 0534  WBC 10.2  < > 7.4 9.0 9.0 7.1 7.8  NEUTROABS 7.3  --   --   --   --   --   --   HGB 10.1*  < > 9.3* 9.8* 9.4* 8.9* 10.2*  HCT 30.4*  < > 28.7* 29.9* 28.4* 26.5* 30.4*  MCV 102.4*  < > 99.0 100.0 99.3 99.6 98.7  PLT 212  < > 160 PLATELET CLUMPS NOTED ON SMEAR, COUNT APPEARS ADEQUATE 191 181 223  < > = values in this interval not displayed. Cardiac Enzymes:  Recent Labs Lab 02/24/14 2115  TROPONINI <0.30   BNP: BNP (last 3 results) No results found for this basename: PROBNP,  in the last 8760 hours CBG: No results found for this basename: GLUCAP,  in the last 168 hours     Signed:  Esta Carmon  [facility] 03/02/2014, 1:23 PM

## 2014-03-02 NOTE — Telephone Encounter (Signed)
Please schedule EGD with esophageal variceal banding in 2-3 weeks with Dr. Oneida Alar.

## 2014-03-02 NOTE — Progress Notes (Addendum)
REVIEWED. AGREE. EGD/EVL AUG 10 AT 0830.

## 2014-03-03 LAB — BODY FLUID CULTURE: Culture: NO GROWTH

## 2014-03-05 ENCOUNTER — Other Ambulatory Visit: Payer: Self-pay | Admitting: Gastroenterology

## 2014-03-05 DIAGNOSIS — K2289 Other specified disease of esophagus: Secondary | ICD-10-CM

## 2014-03-05 DIAGNOSIS — K228 Other specified diseases of esophagus: Secondary | ICD-10-CM

## 2014-03-05 NOTE — Telephone Encounter (Signed)
EGD w/EVL is scheduled on Aug 10th at 8:30 am with [facility] and Instructions have been mailed to Wanda [name]

## 2014-03-05 NOTE — Telephone Encounter (Signed)
Message copied by Rodney Langton on Mon Mar 05, 2014  8:22 AM ------      Message from: Danie Binder      Created: Fri Mar 02, 2014  1:53 PM       EGD WITH EVL AUG 10 Dx: VARICEAL BLEED ------

## 2014-03-07 ENCOUNTER — Telehealth: Payer: Self-pay | Admitting: *Deleted

## 2014-03-07 ENCOUNTER — Telehealth: Payer: Self-pay | Admitting: Gastroenterology

## 2014-03-07 NOTE — Telephone Encounter (Signed)
REVIEWED.  

## 2014-03-07 NOTE — Telephone Encounter (Signed)
Tried to call no answer nor machine

## 2014-03-07 NOTE — Telephone Encounter (Signed)
Please ask pathology to see if they can give evaluation of copper content on liver biopsy done 11/23/13.   Patient had mildly elevated 24hr urinary copper, but serum copper/ceruloplasm were normal.

## 2014-03-07 NOTE — Progress Notes (Signed)
Versed Drip 50mg /79ml x 3 wasted in Sink  By Pricilla Larsson, [facility] Witnessed by Shanna Cisco, CPHT  Pricilla Larsson, [facility]  03/07/2014 10:24 AM

## 2014-03-07 NOTE — Telephone Encounter (Signed)
Pt called wanting to reschedule her EGD due to transportation. Please advise [phone]

## 2014-03-08 NOTE — Telephone Encounter (Signed)
I called [facility] and spoke to [place] this morning before lunch.  She said she will check with their vendor and will be calling me back.

## 2014-03-09 ENCOUNTER — Other Ambulatory Visit (HOSPITAL_COMMUNITY)
Admission: RE | Admit: 2014-03-09 | Discharge: 2014-03-09 | Disposition: A | Discharge status: Home / Self Care | Payer: Medicare Other | Source: Ambulatory Visit | Attending: Gastroenterology | Admitting: Gastroenterology

## 2014-03-09 DIAGNOSIS — K7460 Unspecified cirrhosis of liver: Secondary | ICD-10-CM | POA: Insufficient documentation

## 2014-03-09 DIAGNOSIS — K7689 Other specified diseases of liver: Secondary | ICD-10-CM | POA: Insufficient documentation

## 2014-03-13 ENCOUNTER — Telehealth: Payer: Self-pay

## 2014-03-13 NOTE — Telephone Encounter (Signed)
No narcotics. May use an OTC throat spray.

## 2014-03-13 NOTE — Telephone Encounter (Signed)
Tried to call pt and VM not set up.  

## 2014-03-13 NOTE — Telephone Encounter (Signed)
Called and informed pt.  

## 2014-03-13 NOTE — Telephone Encounter (Signed)
Pt is calling for some pain medication. She is having some pain in her throat/stomach and hurts to swallow. She goes to [facility]. Please advise

## 2014-03-13 NOTE — Telephone Encounter (Signed)
Pt called back. She said she had endoscopy a couple of weeks ago and was given Rx for some pain meds. She is out of them now, and she is still having some pain in her throat. She is scheduled for another endoscopy on 03/23/2014. She would like to know if she can get prescription for more pain pills. Please advise!

## 2014-03-19 NOTE — Telephone Encounter (Signed)
I called [facility] @ [phone] and spoke to [place]. She said the specimen was sent on 03/09/2014 and is usually back in 5-10 business days. It will be scanned into epic and listed under the date of the biopsy, 11/2013.

## 2014-03-23 ENCOUNTER — Encounter (HOSPITAL_COMMUNITY): Admission: RE | Payer: Self-pay | Source: Ambulatory Visit

## 2014-03-23 ENCOUNTER — Ambulatory Visit (HOSPITAL_COMMUNITY): Admission: RE | Admit: 2014-03-23 | Payer: Medicare Other | Source: Ambulatory Visit | Admitting: Gastroenterology

## 2014-03-23 SURGERY — EGD (ESOPHAGOGASTRODUODENOSCOPY)
Anesthesia: Moderate Sedation

## 2014-03-23 NOTE — Telephone Encounter (Signed)
Megan from Endo called and stated that Wanda [name] was a noshow for her EGD w/EVL today, I tried to call and no way to leave a message nor did I get an answer

## 2014-03-27 ENCOUNTER — Encounter (HOSPITAL_COMMUNITY): Payer: Self-pay

## 2014-03-27 NOTE — Telephone Encounter (Signed)
REVIEWED.  

## 2014-04-11 ENCOUNTER — Telehealth: Payer: Self-pay

## 2014-04-11 NOTE — Telephone Encounter (Signed)
PER DR. FIELDS WE HAVE HER COMING IN TOMORROW WITH Wanda [name] FOR OV AND DR. FIELDS SAID WE WILL SCHEDULE HER PARA TODAY IF POSSIBLE.

## 2014-04-11 NOTE — Telephone Encounter (Signed)
CALLED DR. Theador Hawthorne. SCHEDULE PT FOR LV PARACENTESIS TODAY.  PLEASE CALL PT. She needs a large volume paracentesis ASAP. SHE NEEDS ALBUMIN 25 GMS IV AT ONSET AND REPEAT AFTER 4 LS. SHE SHOULD HOLD HER DIURETICS TODAY. SHE NEEDS TO STRICTLY FOLLOW A LOW SALT DIET.

## 2014-04-11 NOTE — Telephone Encounter (Signed)
T/C from [place] at [facility] ( Dr. Florentina Addison office) she said that the doctor there today ( Dr. Theador Hawthorne) would like to speak to Dr. Oneida Alar. He would like this pt seen ASAP due to ascites. I spoke to Dr. Oneida Alar, she is on phone and she will call him back shortly at [phone].

## 2014-04-11 NOTE — Telephone Encounter (Signed)
Please f/u on this. I don't see anything additional added to liver biopsy results for copper.

## 2014-04-11 NOTE — Telephone Encounter (Signed)
I tried to call pt and message said she could not be reached now, to try the call later. Routing to Darius Bump to schedule the PARA.

## 2014-04-11 NOTE — Telephone Encounter (Signed)
Forward

## 2014-04-12 ENCOUNTER — Other Ambulatory Visit: Payer: Self-pay | Admitting: Gastroenterology

## 2014-04-12 ENCOUNTER — Ambulatory Visit (INDEPENDENT_AMBULATORY_CARE_PROVIDER_SITE_OTHER): Payer: Medicare Other | Admitting: Gastroenterology

## 2014-04-12 VITALS — BP 102/62 | HR 126 | Temp 98.8°F | Ht 62.0 in | Wt 134.0 lb

## 2014-04-12 DIAGNOSIS — R188 Other ascites: Secondary | ICD-10-CM

## 2014-04-12 DIAGNOSIS — K7460 Unspecified cirrhosis of liver: Secondary | ICD-10-CM

## 2014-04-12 DIAGNOSIS — R601 Generalized edema: Secondary | ICD-10-CM

## 2014-04-12 DIAGNOSIS — R609 Edema, unspecified: Secondary | ICD-10-CM

## 2014-04-12 MED ORDER — OXYCODONE HCL 5 MG PO TABS: 5.0000 mg | ORAL_TABLET | Freq: Four times a day (QID) | ORAL | Status: DC | PRN

## 2014-04-12 NOTE — Progress Notes (Signed)
Primary Care Physician: No PCP Per Patient  Primary Gastroenterologist:  Barney Drain, MD   Chief Complaint  Patient presents with  . Ascites    HPI: Wanda [name] is a 38 y.o. female here for urgent office visit for ascites at the request of Dr. Gaston Islam. She was seen by her nephrologist yesterday and noted to have significant ascites/peripheral edema. Patient tells me her diuretics were not adjusted and she had no labs drawn. We last saw the patient during 02/2014 hospitalization for esophageal variceal bleeding.   Patient complains of poor appetite, nausea especially with ascites. No heartburn but does have some regurgitation. Admits to laying down within 30 minutes after a meal due to extreme fatigue. States she continues to have diarrhea even off of lactulose. BM "all day long". No melena, brbpr. ?hemorrhoid pain at times. Strained her left thigh yesterday when she had to go up/down steps. Basically does very little ambulation due to fatigue. Lays in bed all the time. Very anxious about dying.   Boyfriend concerned about her loss of muscle. Her weight is down 15 pounds since 03/01/14 (day after last LVAP).   Has discussion with patient/boyfriend today about need for repeat EGD with EV banding, need for cardiology evaluation for tachycardia, consideration of liver transplant center referral. Patient seemed overwhelmed and became tearful. She gets very tired when she goes out and it worries her to have to make appointments. She wants to feel well and wants to live.    Continues to denies is past or present history of etoh use.   Wt Readings from Last 3 Encounters:  04/12/14 134 lb (60.782 kg)  03/01/14 149 lb 14.6 oz (68 kg)  03/01/14 149 lb 14.6 oz (68 kg)    Current Outpatient Prescriptions  Medication Sig Dispense Refill  . pantoprazole (PROTONIX) 40 MG tablet Take 1 tablet (40 mg total) by mouth 2 (two) times daily before a meal. For management of GI bleeding.  60 tablet   3  . potassium chloride 20 MEQ/15ML (10%) solution Take 15 mLs (20 mEq total) by mouth 2 (two) times daily.  500 mL  3  . spironolactone (ALDACTONE) 25 MG tablet Take 1 tablet (25 mg total) by mouth daily. For treatment of swelling  30 tablet  3  . torsemide (DEMADEX) 20 MG tablet Take 2 tablets (40 mg total) by mouth daily. Take for treatment of swelling.  60 tablet  3  . lactulose (CHRONULAC) 10 GM/15ML solution currently on hold Take 15 mLs (10 g total) by mouth every other day.  960 mL  0   No current facility-administered medications for this visit.    Allergies as of 04/12/2014 - Review Complete 04/12/2014  Allergen Reaction Noted  . Lasix [furosemide]  04/12/2014   Past Medical History  Diagnosis Date  . GERD (gastroesophageal reflux disease)   . 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 c/w steatohepatitis  . Acute renal failure 09/2013    Pre-renal- resolved  . Folate deficiency 09/2013  . Anasarca 10/10/2013  . Hematemesis/vomiting blood 02/24/2014  . Acute blood loss anemia 02/25/2014    Status post transfusion  . Macrocytosis 02/28/2014  . Bleeding esophageal varices 02/28/2014    s/p banding   Past Surgical History  Procedure Laterality Date  . None    . Paracentesis  Feb 2015    1180 fluid, negative fluid analysis.   [name] Esophagogastroduodenoscopy N/A 11/14/2013  SLF:1 column of very small varices in distal esopahgus/MODERATE PORTAL GASTROPATHY IN PROXIMAL STOMACH/MODERATE erosive gastritis  . Paracentesis  10/2013  . Colonoscopy N/A 12/19/2013    SLF:NO OBVIOUS SOURCE FOR ANEMIA IDETIFIED/ONE COLON POLYP REMOVED/Small internal hemorrhoids  . Esophagogastroduodenoscopy N/A 02/11/2014    ZHY:QMVHQIONGE varices with bleeding stigmata-status post esophageal band ligation therapy. Portal gastropathy   Family History  Problem Relation Age of Onset  . Heart disease Mother   . Colon cancer Neg Hx   . Liver disease Neg Hx    History    Social History  . Marital Status: Single    Spouse Name: N/A    Number of Children: 1  . Years of Education: N/A   Occupational History  . unemployed    Social History Main Topics  . Smoking status: Former Smoker -- 0.25 packs/day for 20 years    Types: Cigarettes    Quit date: 11/05/2008  . Smokeless tobacco: Never Used  . Alcohol Use: No  . Drug Use: No  . Sexual Activity: Yes    Partners: Male    Birth Control/ Protection: None   Other Topics Concern  . None   Social History Narrative  . None    ROS:  General: see hpi ENT: Negative for hoarseness, difficulty swallowing , nasal congestion. CV: Negative for chest pain, angina, palpitations, dyspnea on exertion. + peripheral edema.  Respiratory: Negative for dyspnea at rest, dyspnea on exertion, cough, sputum, wheezing.  GI: See history of present illness. GU:  Negative for dysuria, hematuria, urinary incontinence, urinary frequency, nocturnal urination.  Endo: see hpi Physical Examination:   BP 102/62  Pulse 126  Temp(Src) 98.8 F (37.1 C) (Oral)  Ht 5\' 2"  (1.575 m)  Wt 134 lb (60.782 kg)  BMI 24.50 kg/m2  General: chronically ill-appearing, in NAD. Boyfriend present Eyes: + icterus. Mouth: Oropharyngeal mucosa moist and pink , no lesions erythema or exudate. Lungs: Clear to auscultation bilaterally.  Heart: Regular rate and slight tachycardia, no murmurs rubs or gallops.  Abdomen: Bowel sounds are normal, tense (exam done in wheelchair per patient refusal to climb onto table) nontender, no rebound or guarding.   Extremities: 2+ lower extremity edema. No clubbing or deformities. Neuro: Alert and oriented x 4   Skin: Warm and dry, + jaundice.   Psych: Alert and cooperative, normal mood and affect.  Labs:  Lab Results  Component Value Date   ALT 15 02/28/2014   AST 78* 02/28/2014   ALKPHOS 108 02/28/2014   BILITOT 2.8* 02/28/2014   Lab Results  Component Value Date   CREATININE 1.19* 03/02/2014   BUN  21 03/02/2014   NA 144 03/02/2014   K 3.2* 03/02/2014   CL 108 03/02/2014   CO2 24 03/02/2014   Lab Results  Component Value Date   WBC 7.8 03/02/2014   HGB 10.2* 03/02/2014   HCT 30.4* 03/02/2014   MCV 98.7 03/02/2014   PLT 223 03/02/2014   Lab Results  Component Value Date   INR 1.22 02/28/2014   INR 1.22 02/27/2014   INR 1.22 02/26/2014    Imaging Studies: No results found.

## 2014-04-12 NOTE — Progress Notes (Signed)
No pcp

## 2014-04-12 NOTE — Telephone Encounter (Signed)
Pt in office now 

## 2014-04-12 NOTE — Patient Instructions (Signed)
1. Please have your labs done and your stool test done. 2. We will arrange for you to have fluid drawn off your belly. 3. You need to have an upper endoscopy to band the blood vessels in your esophagus again. Call me when you are ready to have this done. 4. Office visit in 6 weeks with Dr. Oneida Alar.

## 2014-04-12 NOTE — Assessment & Plan Note (Addendum)
38 y/o with biopsy proven steatohepatitis/cirrhosis. Previously 24 hour urinary copper minimally elevated. I have requested copper studies on the liver tissue but waiting on results. She has had more difficulty with anasarca over the past few months. Presents wanting LVAP. Prior tap with reactive mesothelial cells/acute inflammation. Cultures/cell ct unremarkable.   She has multiple issues. She continues to lose weight even in setting of fluid retention. Evidence of muscle wasting. She has poor appetite. Continues to have tachycardia, diarrhea.   Plans for LVAP with cytology, cell ct/diff, culture. She will receive IV albumin 25g at onset and additional 25g after 4 liters.  Check labs and C.Diff PCR. Cannot adjust diuretics until labs reviewed.  Short course of oxycodone 5mg  every 6hours prn pain for leg pain.  She needs repeat EGD with EV banding but patient currently declines.  Offered her cardiology referral for tachycardia (she failed to follow through previously) but patient declined for now. She should consider evaluation at liver transplant center in very near future. Currently declines. Follow up on liver bx copper testing.  Encouraged frequent small meals.

## 2014-04-13 ENCOUNTER — Ambulatory Visit (HOSPITAL_COMMUNITY)
Admission: RE | Admit: 2014-04-13 | Discharge: 2014-04-13 | Disposition: A | Discharge status: Home / Self Care | Payer: Medicare Other | Source: Ambulatory Visit | Attending: Gastroenterology | Admitting: Gastroenterology

## 2014-04-13 DIAGNOSIS — R188 Other ascites: Secondary | ICD-10-CM | POA: Diagnosis not present

## 2014-04-13 DIAGNOSIS — K7460 Unspecified cirrhosis of liver: Secondary | ICD-10-CM | POA: Insufficient documentation

## 2014-04-13 LAB — BODY FLUID CELL COUNT WITH DIFFERENTIAL
EOS FL: 0 %
LYMPHS FL: 38 %
Monocyte-Macrophage-Serous Fluid: 55 % (ref 50–90)
NEUTROPHIL FLUID: 7 % (ref 0–25)
Total Nucleated Cell Count, Fluid: 79 cu mm (ref 0–1000)

## 2014-04-13 LAB — COMPREHENSIVE METABOLIC PANEL
ALBUMIN: 2.8 g/dL — AB (ref 3.5–5.2)
ALT: 13 U/L (ref 0–35)
AST: 82 U/L — ABNORMAL HIGH (ref 0–37)
Alkaline Phosphatase: 197 U/L — ABNORMAL HIGH (ref 39–117)
BUN: 27 mg/dL — AB (ref 6–23)
CALCIUM: 7.9 mg/dL — AB (ref 8.4–10.5)
CHLORIDE: 95 meq/L — AB (ref 96–112)
CO2: 25 meq/L (ref 19–32)
Creat: 2.15 mg/dL — ABNORMAL HIGH (ref 0.50–1.10)
GLUCOSE: 118 mg/dL — AB (ref 70–99)
Potassium: 2.9 mEq/L — ABNORMAL LOW (ref 3.5–5.3)
SODIUM: 136 meq/L (ref 135–145)
TOTAL PROTEIN: 7.5 g/dL (ref 6.0–8.3)
Total Bilirubin: 9.4 mg/dL — ABNORMAL HIGH (ref 0.2–1.2)

## 2014-04-13 LAB — CBC WITH DIFFERENTIAL/PLATELET
Basophils Absolute: 0.1 10*3/uL (ref 0.0–0.1)
Basophils Relative: 1 % (ref 0–1)
Eosinophils Absolute: 0.1 10*3/uL (ref 0.0–0.7)
Eosinophils Relative: 1 % (ref 0–5)
HEMATOCRIT: 20.8 % — AB (ref 36.0–46.0)
Hemoglobin: 7.5 g/dL — ABNORMAL LOW (ref 12.0–15.0)
LYMPHS ABS: 2.2 10*3/uL (ref 0.7–4.0)
Lymphocytes Relative: 16 % (ref 12–46)
MCH: 32.9 pg (ref 26.0–34.0)
MCHC: 36.1 g/dL — ABNORMAL HIGH (ref 30.0–36.0)
MCV: 91.2 fL (ref 78.0–100.0)
MONO ABS: 1 10*3/uL (ref 0.1–1.0)
Monocytes Relative: 7 % (ref 3–12)
NEUTROS ABS: 10.3 10*3/uL — AB (ref 1.7–7.7)
NEUTROS PCT: 75 % (ref 43–77)
Platelets: 230 10*3/uL (ref 150–400)
RBC: 2.28 MIL/uL — AB (ref 3.87–5.11)
RDW: 19.2 % — ABNORMAL HIGH (ref 11.5–15.5)
WBC: 13.7 10*3/uL — ABNORMAL HIGH (ref 4.0–10.5)

## 2014-04-13 IMAGING — CR DG ABDOMEN ACUTE W/ 1V CHEST
4 series · 4 of 4 positions shown · non-contrast
Comparison: None.

CLINICAL DATA: Abdominal pain. Diarrhea. Decreased urine output.
Weakness.

EXAM:
ACUTE ABDOMEN SERIES (ABDOMEN 2 VIEW & CHEST 1 VIEW)

[view not recorded (1 of 4)]
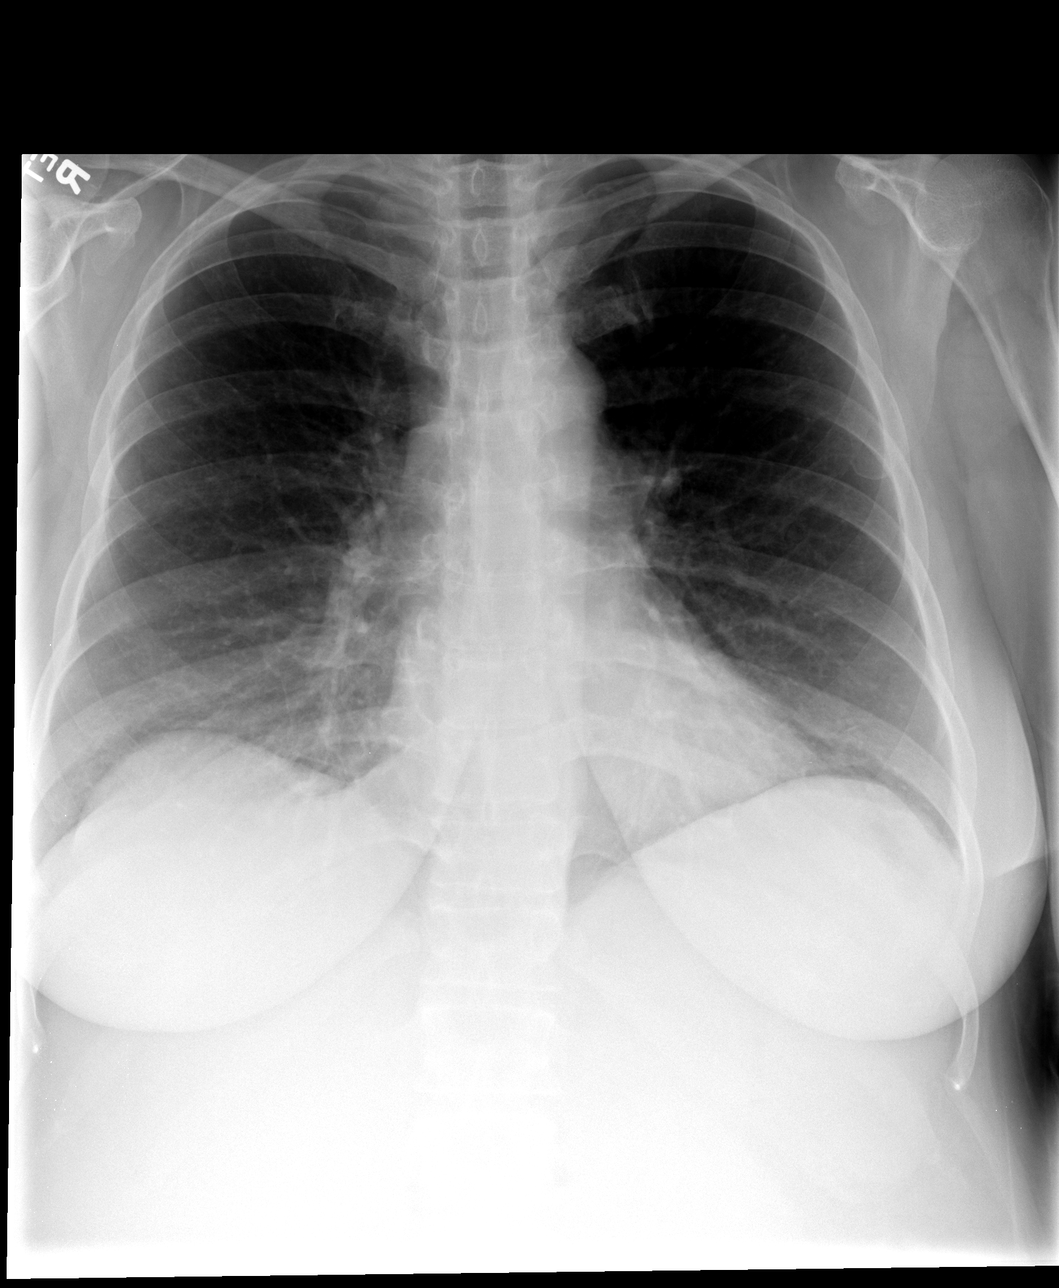

[view not recorded (2 of 4)]
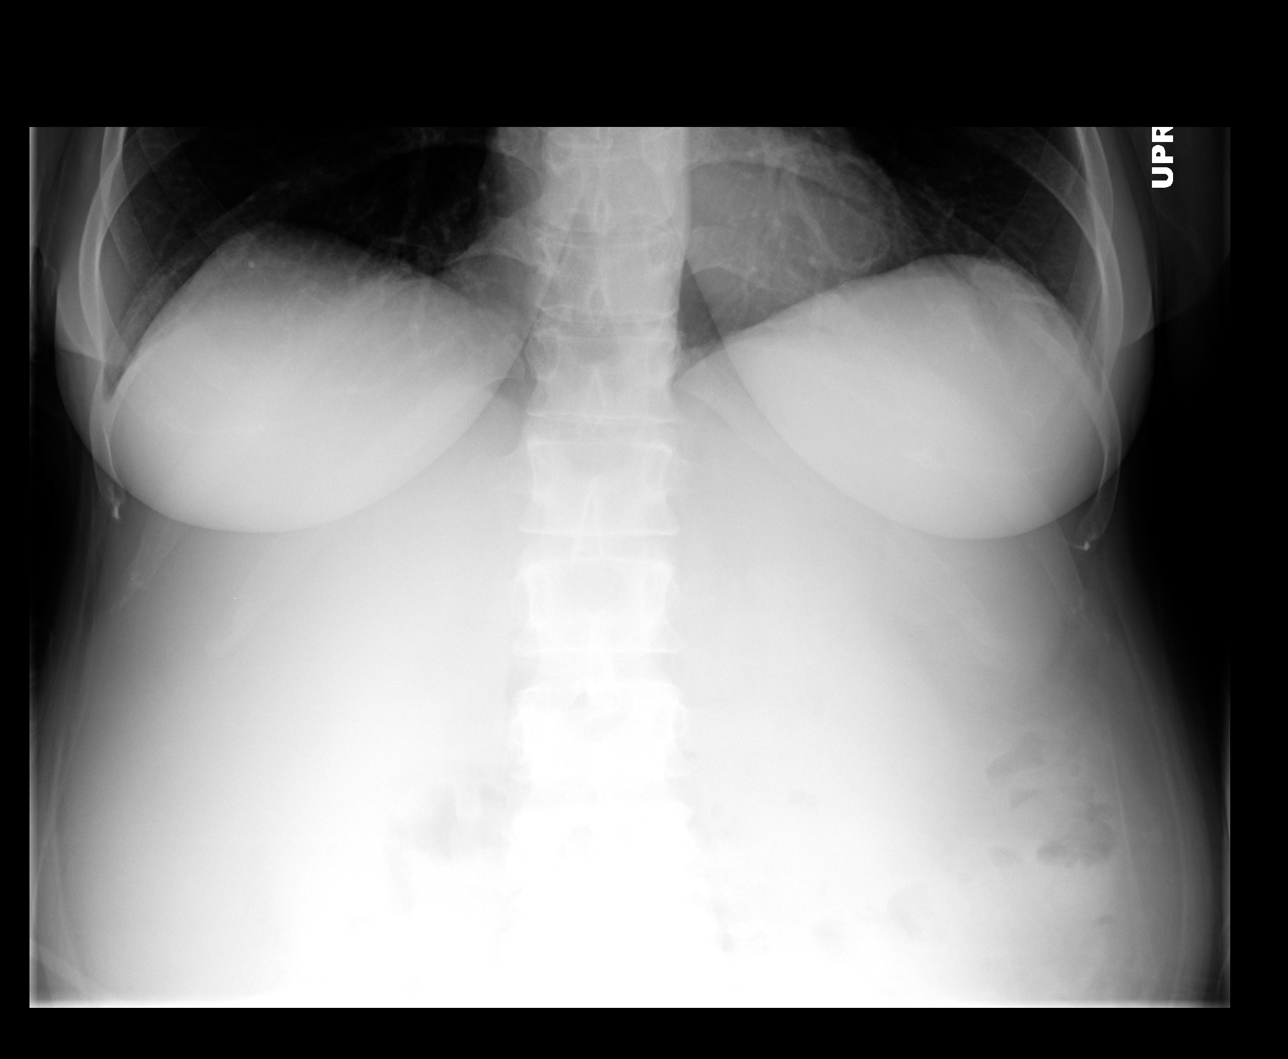

[view not recorded (3 of 4)]
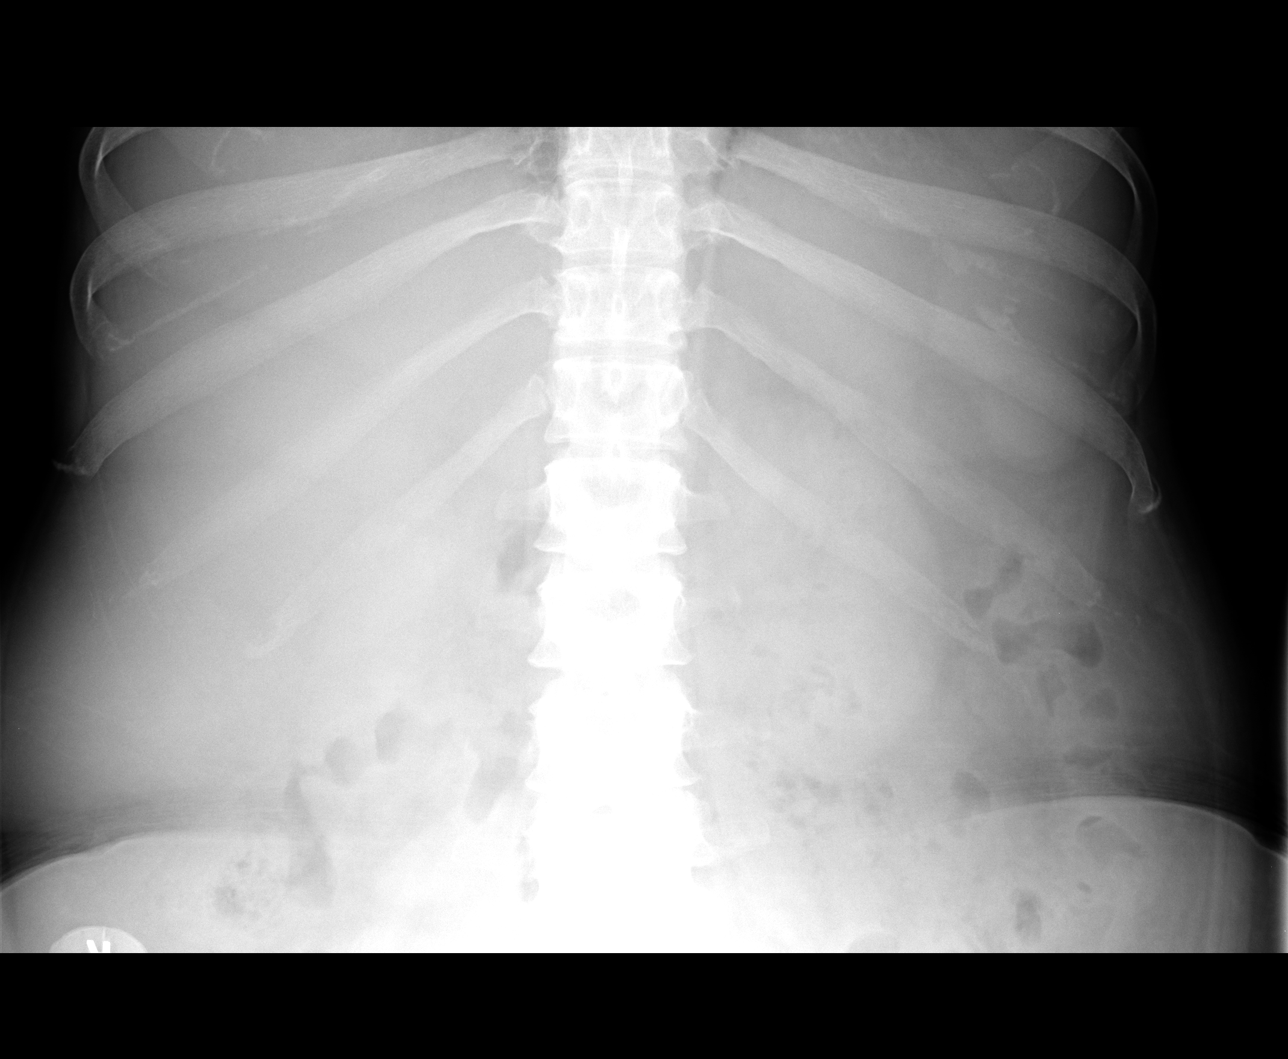

[view not recorded (4 of 4)]
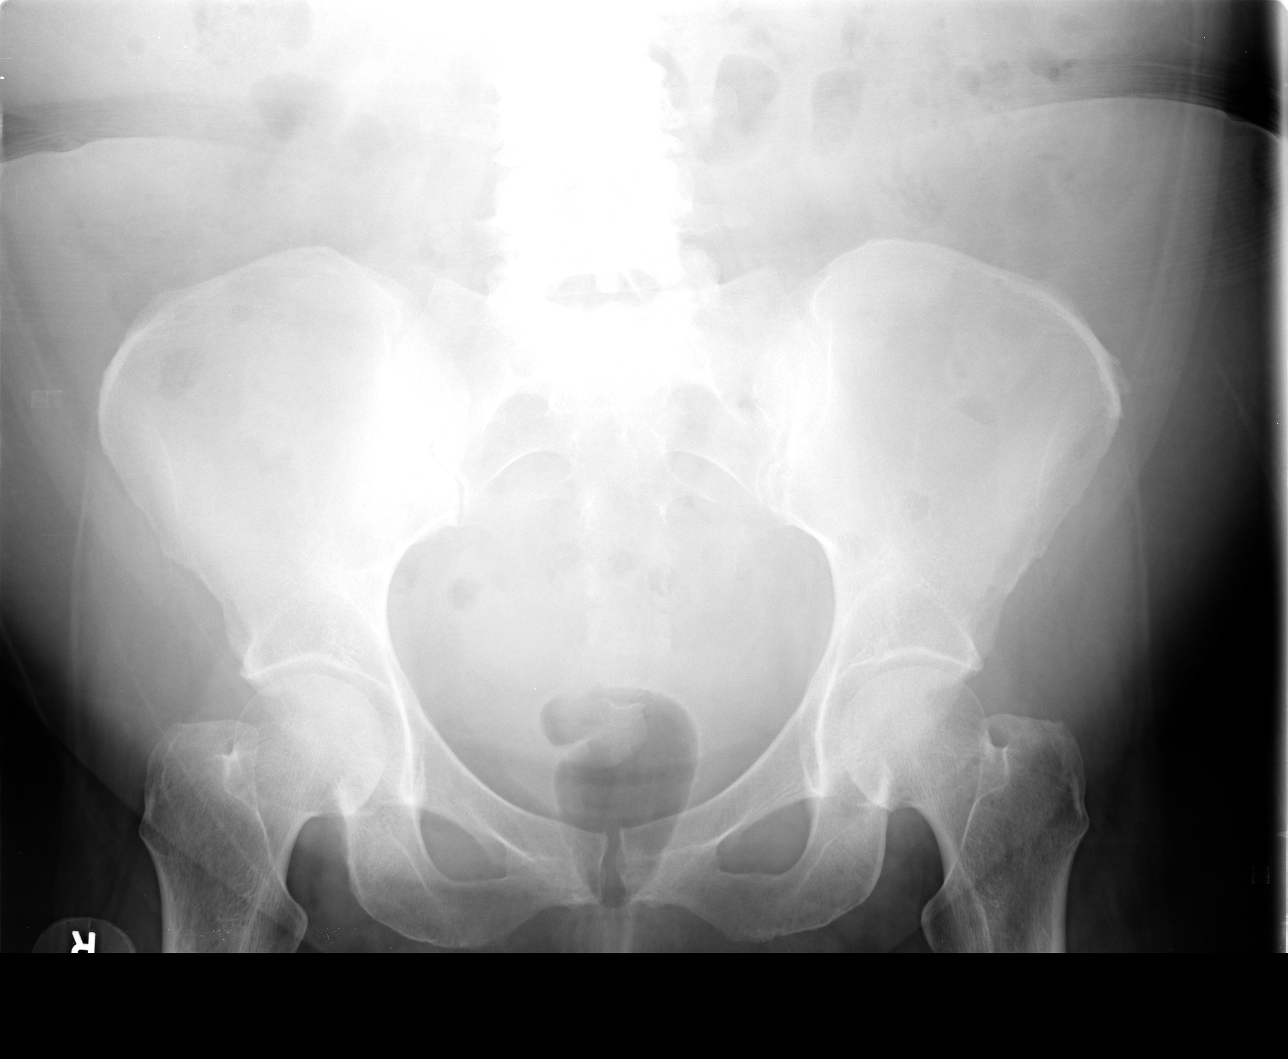

[4 of 4 positions shown; findings below may reference images not displayed]

FINDINGS: There is no evidence of dilated bowel loops or free intraperitoneal
air. No radiopaque calculi or other significant radiographic
abnormality is seen. Heart size and mediastinal contours are within
normal limits. Both lungs are clear.
IMPRESSION: Negative abdominal radiographs.  No acute cardiopulmonary disease.

## 2014-04-13 IMAGING — CT CT ABD-PELV W/ CM
2 of 4 series · 16 of 46 positions shown, 18 images · IV contrast (Omnipaque 300)
Comparison: None.

CLINICAL DATA: Diarrhea.  Leg weakness.  Gastrointestinal bleeding.

EXAM:
CT ABDOMEN AND PELVIS WITH CONTRAST
TECHNIQUE: Multidetector CT imaging of the abdomen and pelvis was performed
using the standard protocol following bolus administration of
intravenous contrast.
CONTRAST:  50mL OMNIPAQUE IOHEXOL 300 MG/ML SOLN, 100mL OMNIPAQUE
IOHEXOL 300 MG/ML SOLN

[Series 2: abd_pel_with 5.0 b40f · axial · 0.86mm/px · z∈[-448,-32]mm · 13 of 91 slices shown, 15 images]
[im 4/91  soft-tissue]
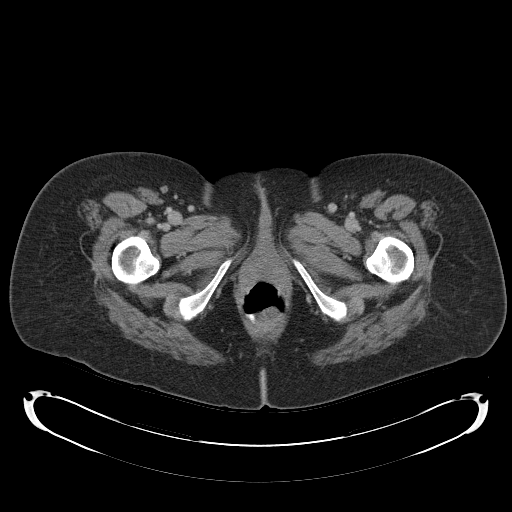
[im 4/91  bone]
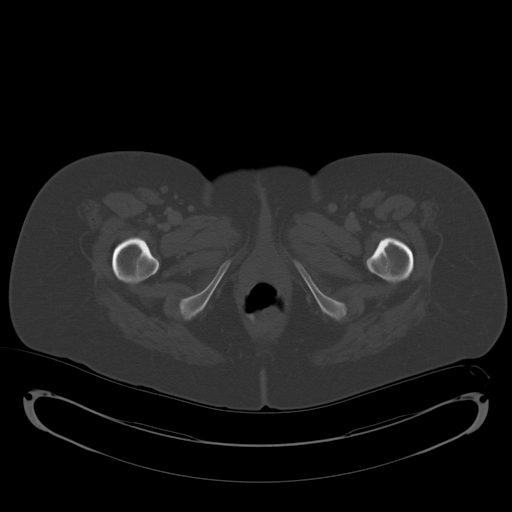
[im 11/91  soft-tissue]
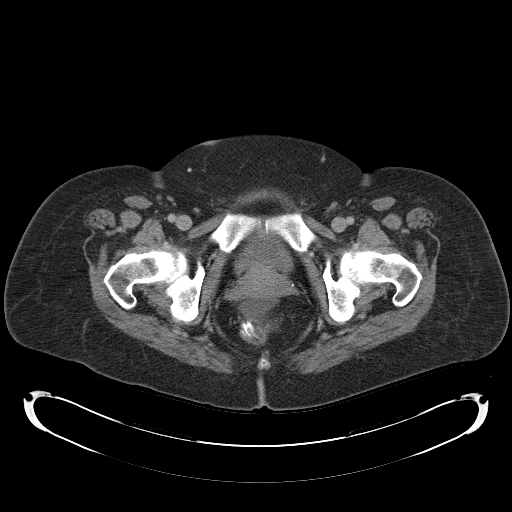
[im 19/91  soft-tissue]
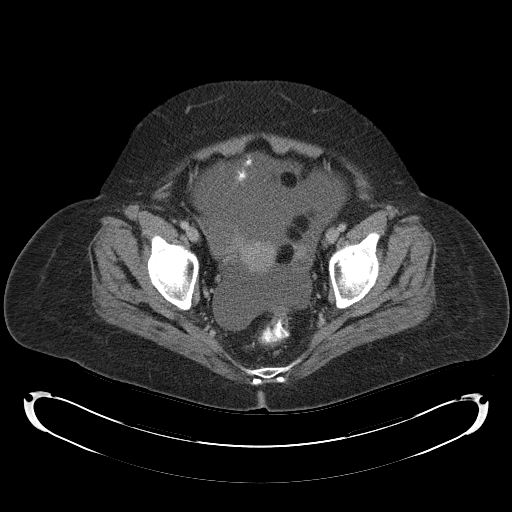
[im 26/91  soft-tissue]
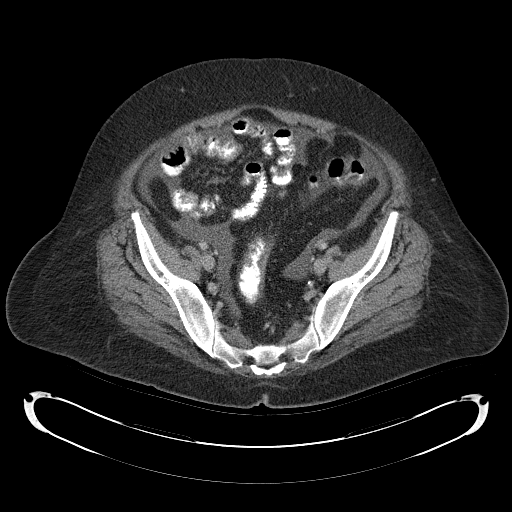
[im 33/91  soft-tissue]
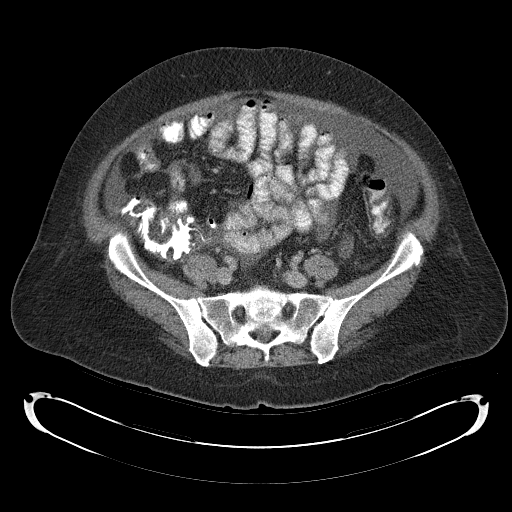
[im 40/91  soft-tissue]
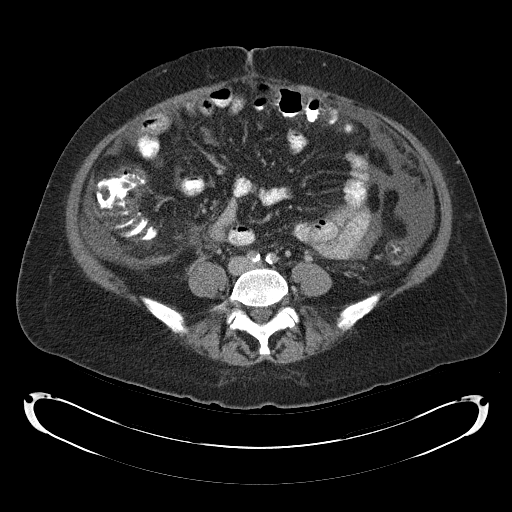
[im 47/91  soft-tissue]
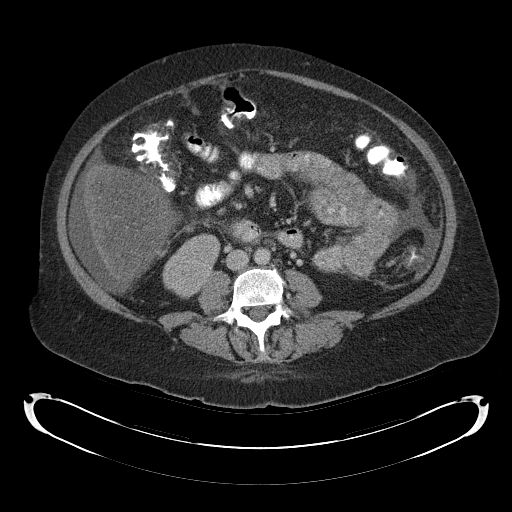
[im 51/91  soft-tissue]
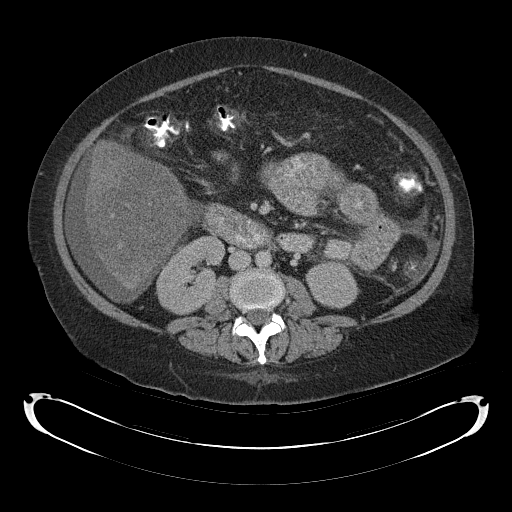
[im 58/91  soft-tissue]
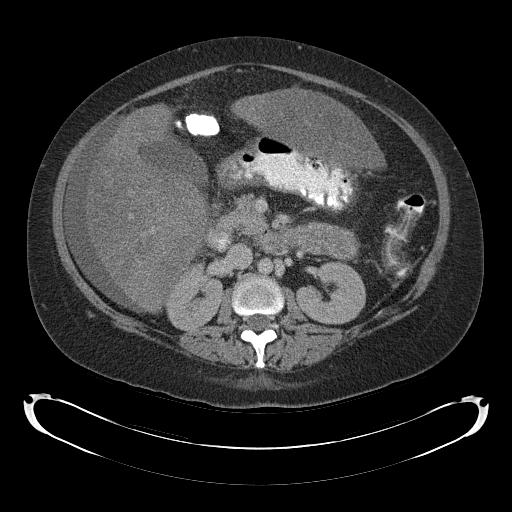
[im 58/91  bone]
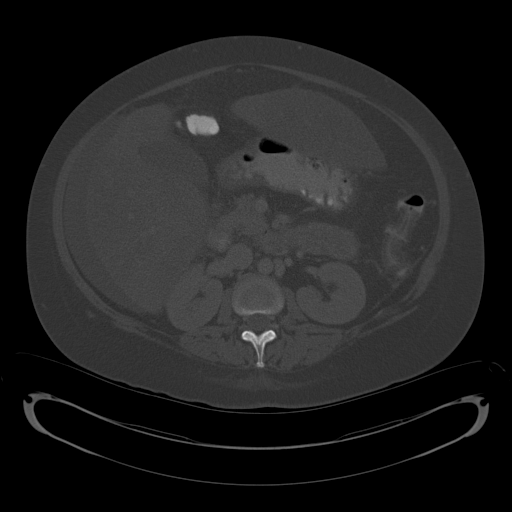
[im 65/91  soft-tissue]
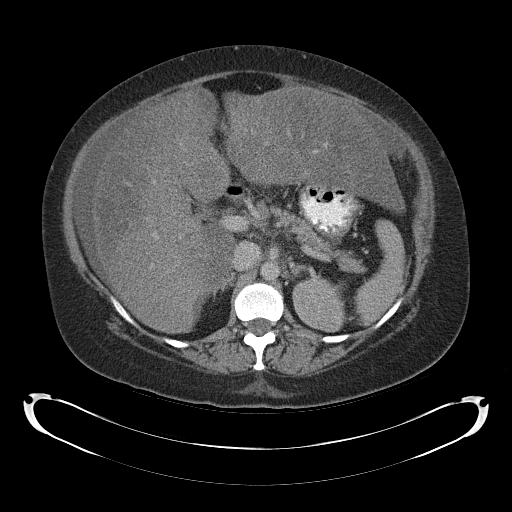
[im 73/91  soft-tissue]
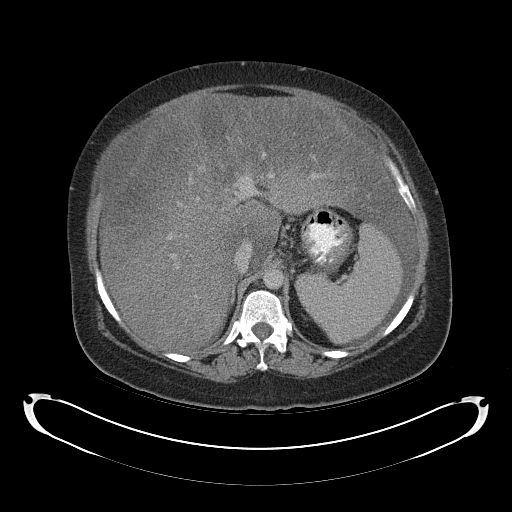
[im 80/91  soft-tissue]
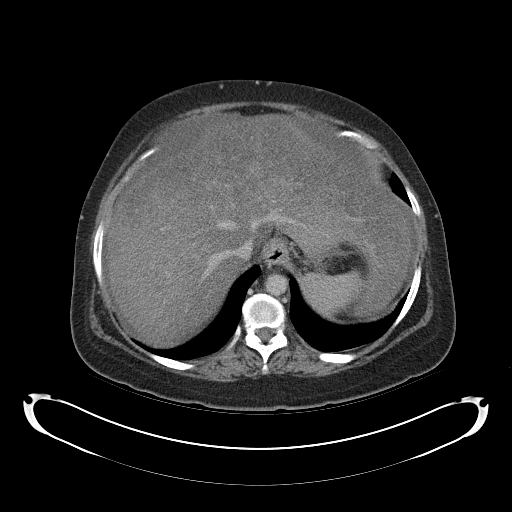
[im 87/91  soft-tissue]
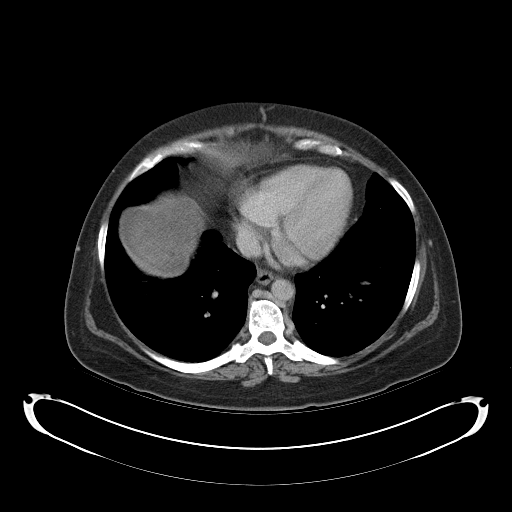

[Series 4: abd_pel_with 3.0 spo cor · coronal · 0.70mm/px · 3 of 99 slices shown]
[im 33/99  soft-tissue]
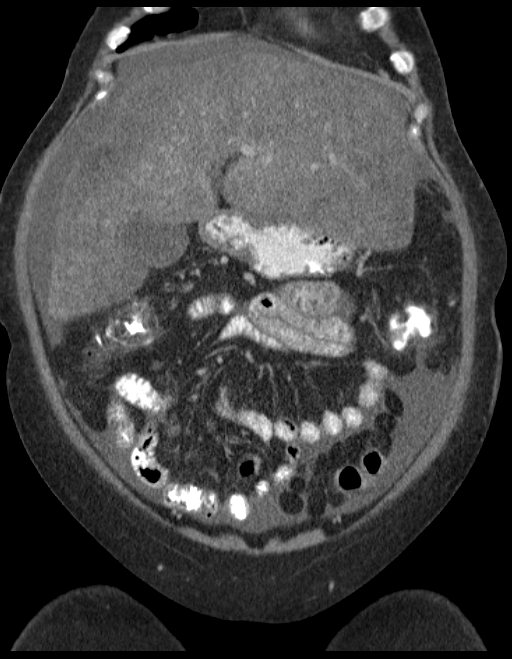
[im 44/99  soft-tissue]
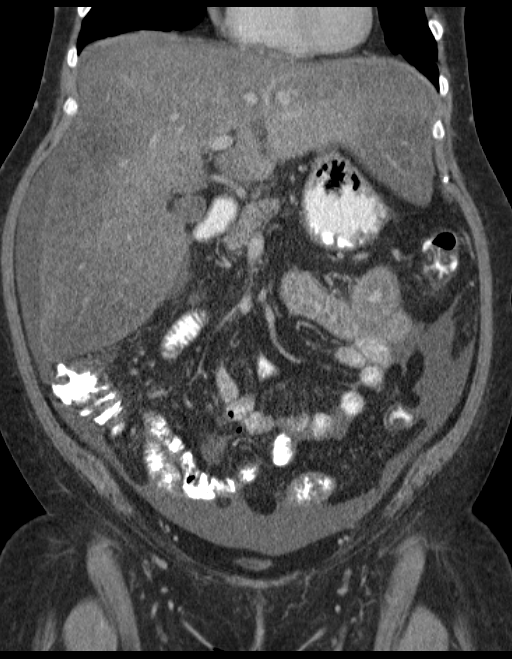
[im 55/99  soft-tissue]
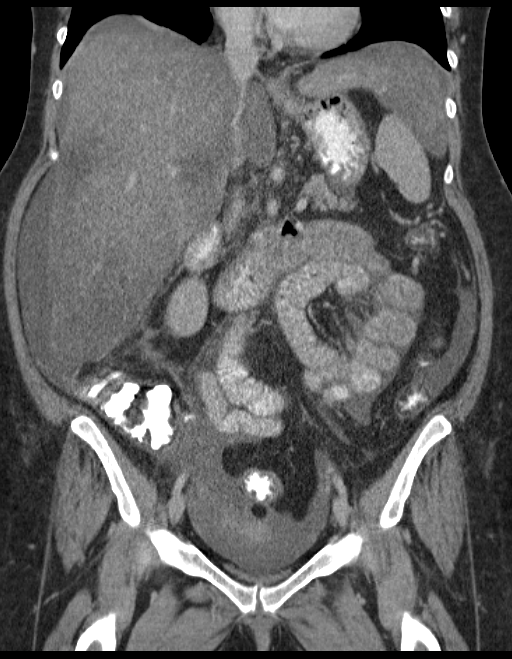

[16 of 46 positions shown; findings below may reference images not displayed]

FINDINGS: Lung bases are clear.  No pleural or pericardial fluid.

There is a large amount of ascites distributed freely throughout the
peritoneal space. No loculated components. The liver is markedly
enlarged and extremely fatty. Certain areas of the liver show
extreme fatty change, particularly the ventral aspect and the
caudate lobe. No suspicion of mass lesion. No calcified gallstones.
No sign of ductal dilatation. The spleen is normal. Hepatic vein and
portal veins are patent. The pancreas is normal. The adrenal glands
are normal. The kidneys are normal. The aorta shows atherosclerosis
but no aneurysm. The IVC is normal. No retroperitoneal mass or
adenopathy. No free intraperitoneal air. No definite focal bowel
lesion. The colon may show slight wall thickening diffusely which
could be seen with colitis or with low systemic protein. Uterus and
adnexal regions appear normal. The appendix is normal. No bony
abnormality.
IMPRESSION: Ascites diffusely distributed.

Profound hepatomegaly and fatty change of the liver.

Possible wall thickening of the colon, which could be seen with
colitis or with low protein.

Considerations include primary steatosis, toxin exposure, and viral
hepatitis.

## 2014-04-13 MED ORDER — ALBUMIN HUMAN 25 % IV SOLN
INTRAVENOUS | Status: AC
  Administered 2014-04-13: 25 g
  Filled 2014-04-13: qty 100

## 2014-04-13 NOTE — Procedures (Signed)
PreOperative Dx: Cirrhosis, ascites Postoperative Dx: Cirrhosis, ascites Procedure:   US guided paracentesis Radiologist:  Thornton Papas Anesthesia:  10 ml of 1% lidocaine Specimen:  5300 ml of yellow colored ascitic fluid EBL:   < 1 ml Complications: None

## 2014-04-13 NOTE — Progress Notes (Signed)
Paracentesis complete no signs of distress. 5300 ml yellow colored ascites removed.  

## 2014-04-14 LAB — PROTIME-INR
INR: 1.34 (ref <1.50)
Prothrombin Time: 16.6 seconds — ABNORMAL HIGH (ref 11.6–15.2)

## 2014-04-14 IMAGING — US US PARACENTESIS
1 series · 1 of 1 positions shown · non-contrast
Comparison: CT abdomen and pelvis 09/11/2013.

CLINICAL DATA: New onset ascites, jaundice

EXAM:
ULTRASOUND GUIDED DIAGNOSTIC AND THERAPEUTIC PARACENTESIS

[Series 1: us paracentesis · 0.17mm/px · 1 of 1 slices shown]
[im 1/1]
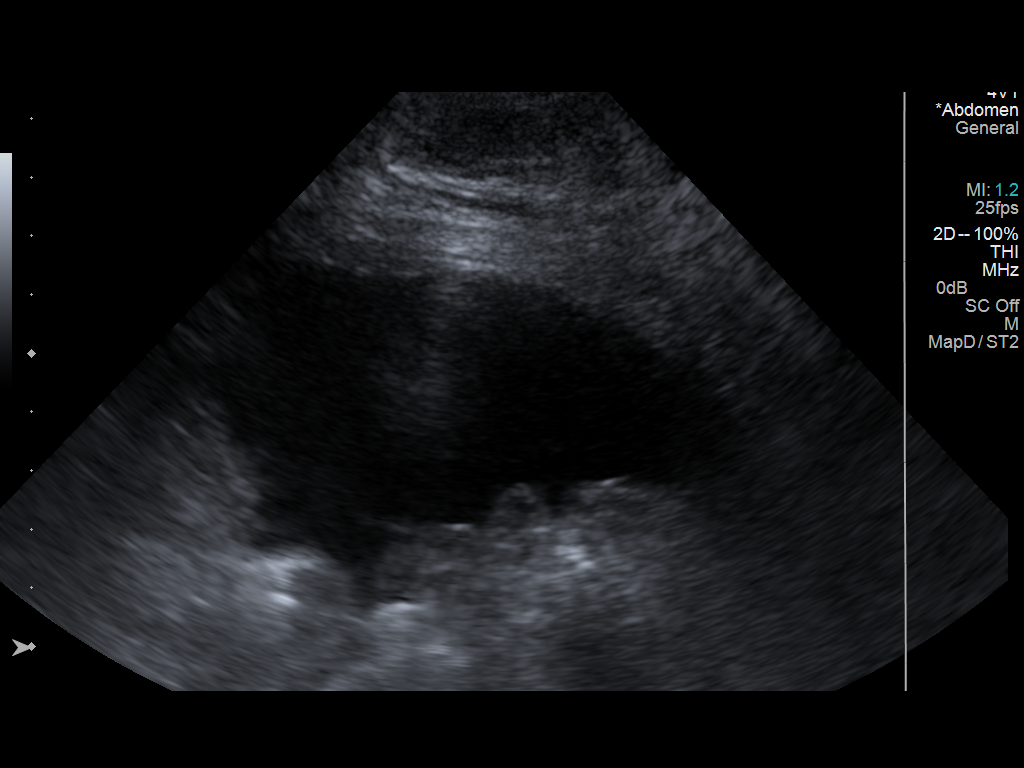

[1 of 1 positions shown; findings below may reference images not displayed]

PROCEDURE:
Procedure, benefits, and risks of procedure were discussed with
patient.

Written informed consent for procedure was obtained.

Time out protocol followed.

Adequate collection of ascites localized in left lower quadrant.

Skin prepped and draped in usual sterile fashion.

Skin and soft tissues anesthetized with 9 mL of 1% lidocaine.

5 French Yueh catheter placed into peritoneal cavity.

3359 mL of yellow fluid aspirated by vacuum bottle suction.

Procedure tolerated well by patient without immediate complication.
FINDINGS: A total of approximately 3359 mL of yellow fluid was removed. A
fluid sample of 180 mL was sent for laboratory analysis.
IMPRESSION: Successful ultrasound guided paracentesis yielding 3359 mL of
ascites.

## 2014-04-14 IMAGING — US US ABDOMEN COMPLETE
1 series · 13 of 25 positions shown · non-contrast
Comparison: CT abdomen and pelvis 09/11/2013

CLINICAL DATA: Jaundice

EXAM:
ULTRASOUND ABDOMEN COMPLETE

[Series 1: us abdomen complete · 0.26mm/px · 13 of 113 slices shown]
[im 1/113]
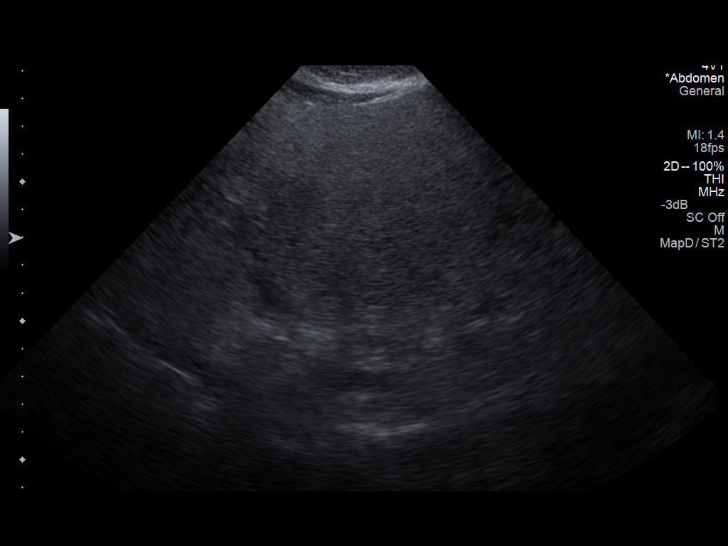
[im 10/113]
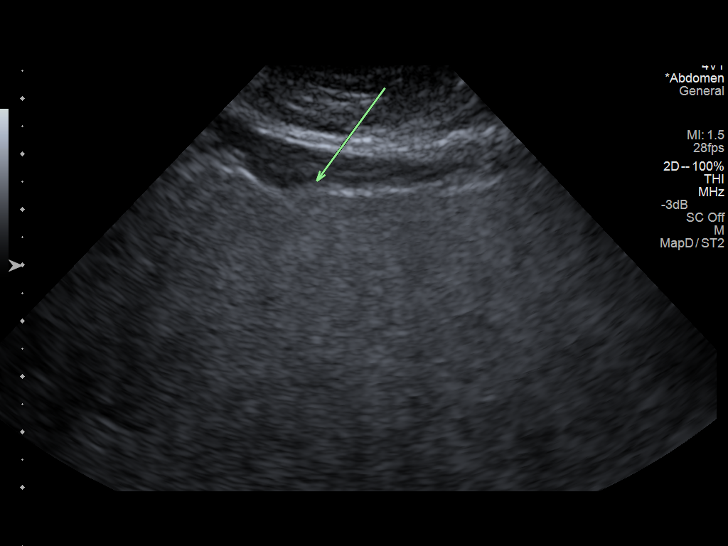
[im 19/113]
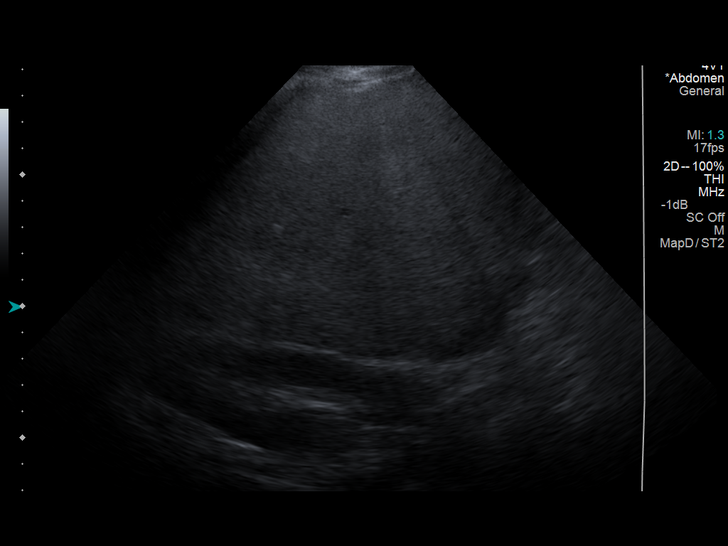
[im 29/113]
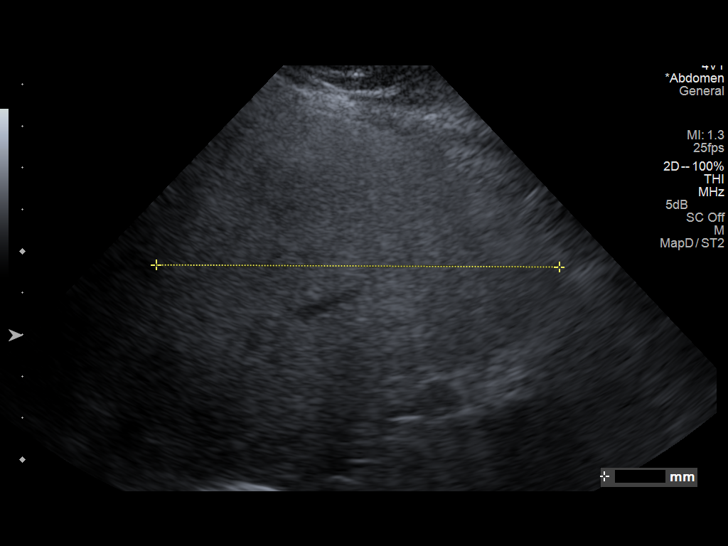
[im 38/113]
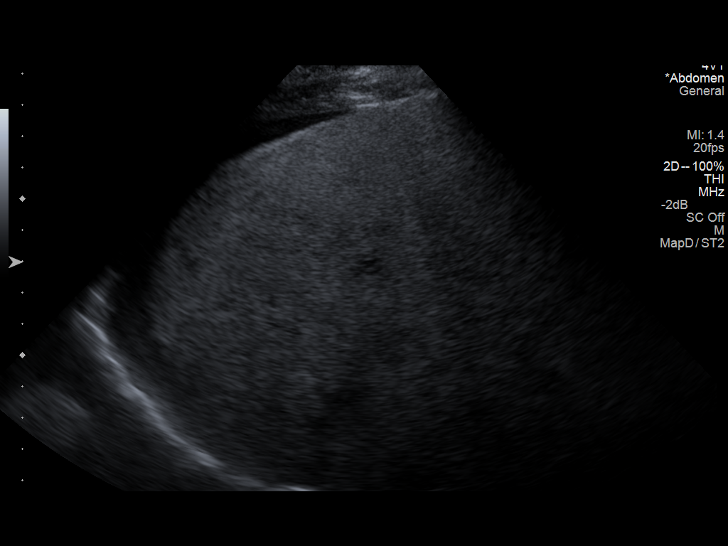
[im 47/113]
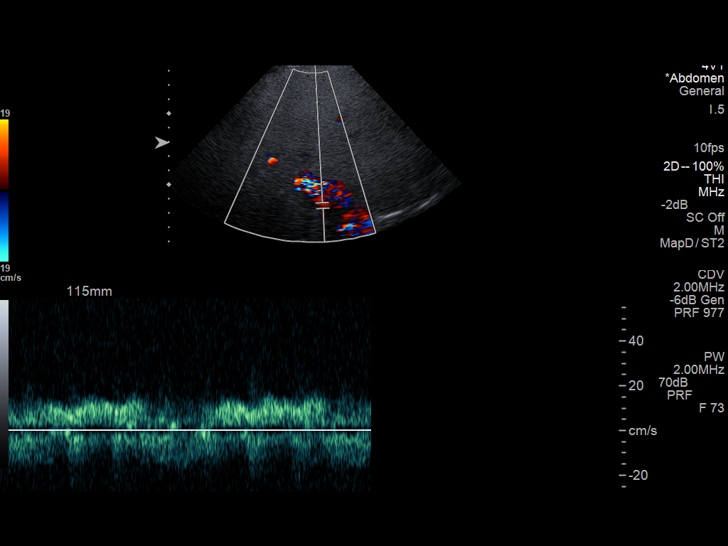
[im 57/113]
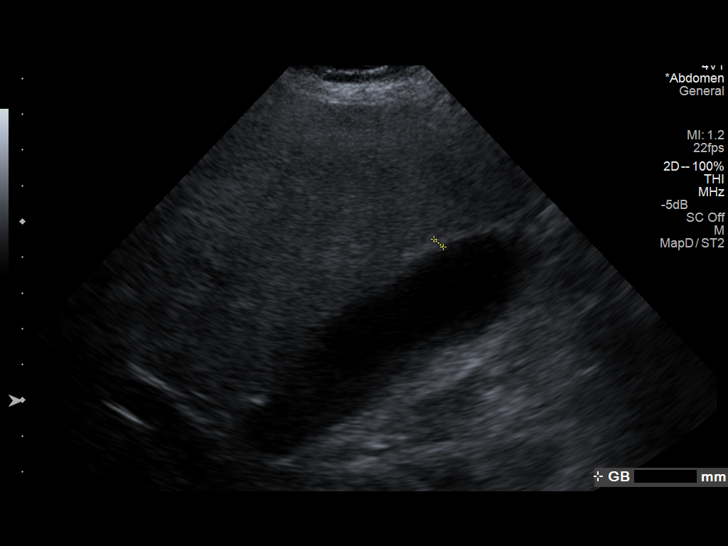
[im 66/113]
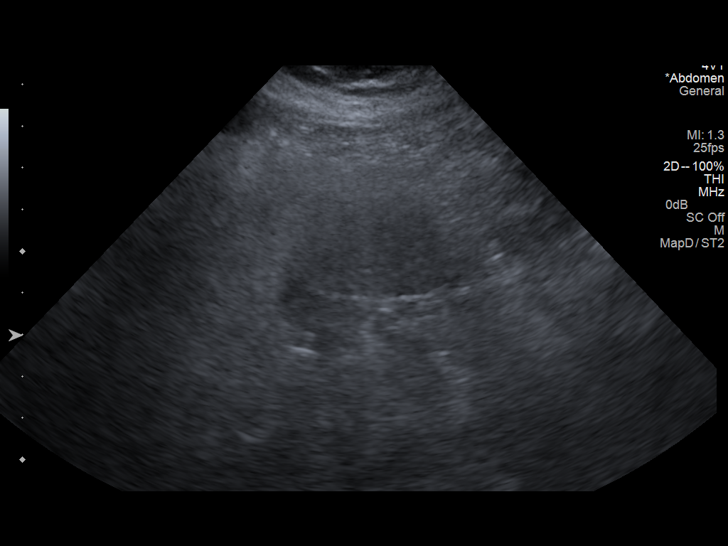
[im 75/113]
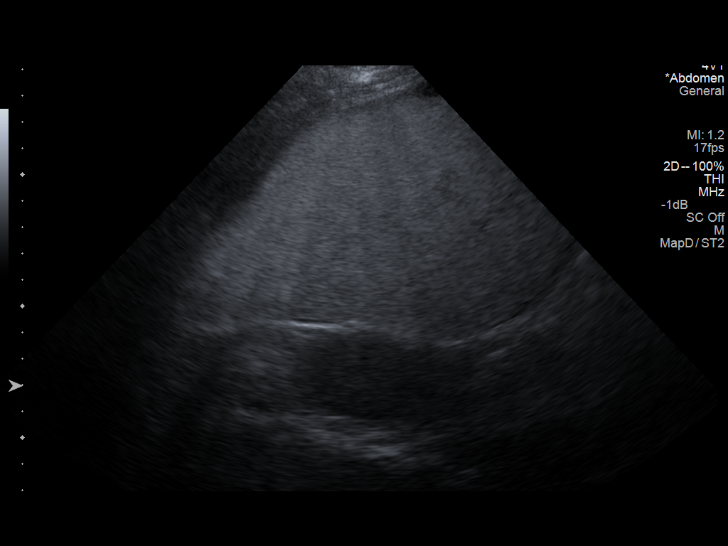
[im 85/113]
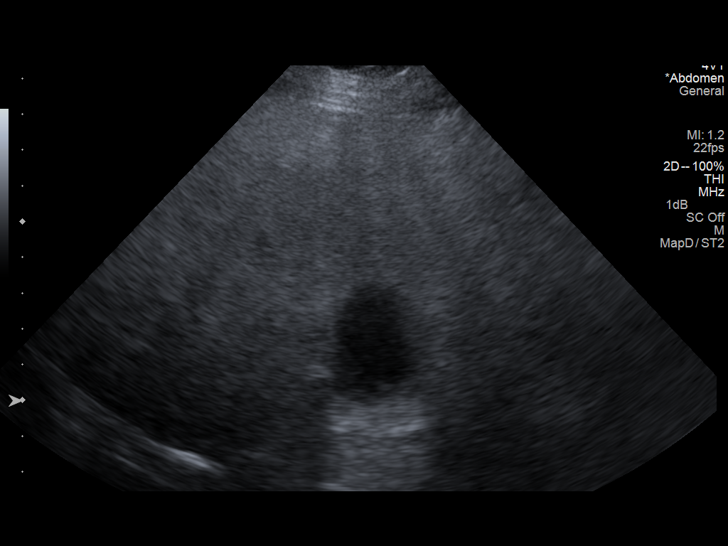
[im 94/113]
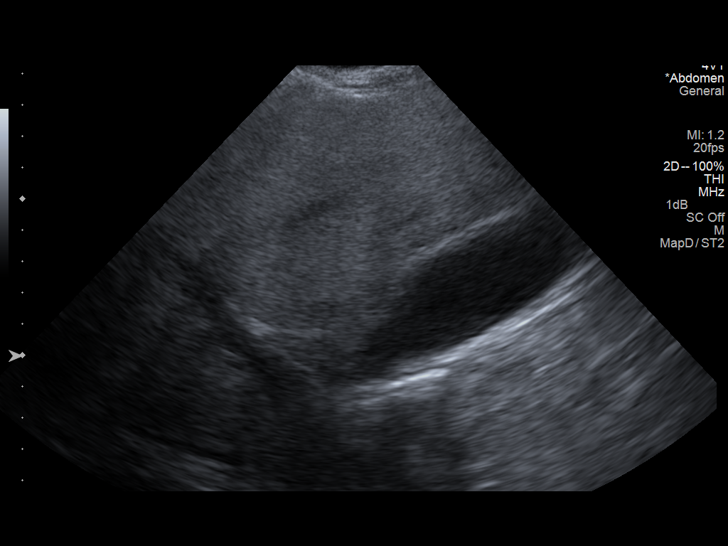
[im 103/113]
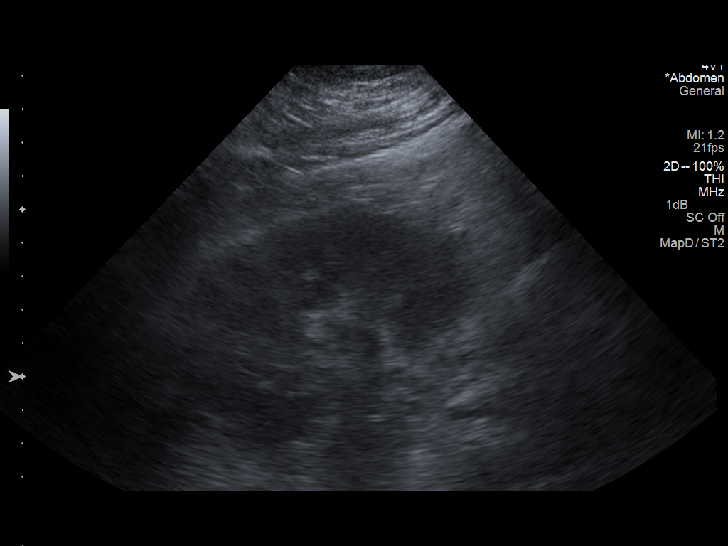
[im 113/113]
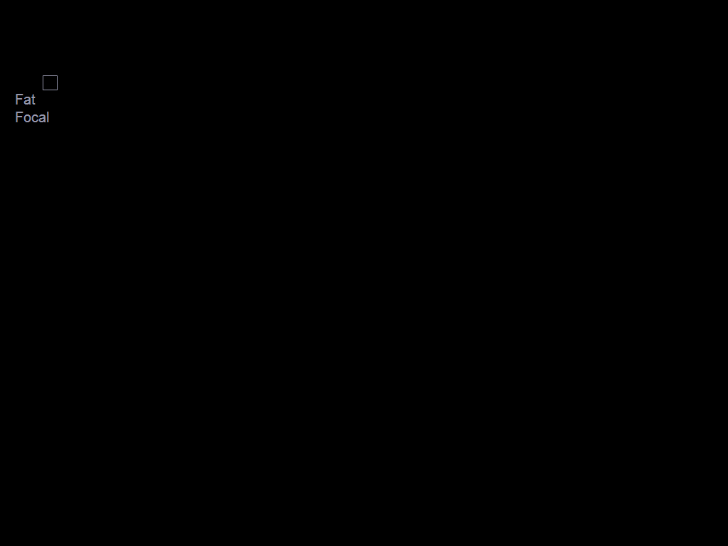

[13 of 25 positions shown; findings below may reference images not displayed]

FINDINGS: Gallbladder:

Sludge within gallbladder. No gallbladder wall thickening or
sonographic Murphy sign.

Common bile duct:

Diameter: 3 mm diameter, normal

Liver:

Echogenic, which can be seen with fatty infiltration and cirrhosis
is well as certain infiltrative disorders. A few images suggests
minimal nodularity of the hepatic surface. No discrete hepatic mass
identified. No intrahepatic biliary dilatation. Diminished blood
flow is identified in the portal vein.

IVC:

No abnormality visualized.

Pancreas:

Limited due to hepatic enlargement and increased hepatic
echogenicity. Small portion of the pancreatic body and proximal tail
is normal appearance, remainder obscured.

Spleen:

Normal appearance, 10.5 cm length

Right Kidney:

Length: 11.7 cm.  Normal morphology without mass or hydronephrosis.

Left Kidney:

Length: 11.2 cm.  Normal morphology without mass or hydronephrosis.

Abdominal aorta:

Inadequately visualized for assessment due to combination of
enlarged echogenic liver and Central bowel loops due to ascites.

Other findings:

Free intraperitoneal fluid consistent with ascites.
IMPRESSION: Enlarged echogenic liver which can be seen with fatty infiltration
and cirrhosis as well as certain infiltrative disorders.

Question minimal nodularity of the hepatic surface on a few images,
unable to exclude cirrhosis.

Ascites.

Question diminished flow in the portal vein, see visceral Doppler
ultrasound report.

## 2014-04-15 LAB — CLOSTRIDIUM DIFFICILE BY PCR: Toxigenic C. Difficile by PCR: DETECTED — CR

## 2014-04-17 ENCOUNTER — Telehealth: Payer: Self-pay | Admitting: Gastroenterology

## 2014-04-17 ENCOUNTER — Encounter (HOSPITAL_COMMUNITY): Payer: Self-pay | Admitting: Emergency Medicine

## 2014-04-17 ENCOUNTER — Emergency Department (HOSPITAL_COMMUNITY)
Admission: EM | Admit: 2014-04-17 | Discharge: 2014-04-17 | Disposition: A | Discharge status: Home / Self Care | Payer: Medicare Other | Attending: Emergency Medicine | Admitting: Emergency Medicine

## 2014-04-17 DIAGNOSIS — N289 Disorder of kidney and ureter, unspecified: Secondary | ICD-10-CM | POA: Diagnosis not present

## 2014-04-17 DIAGNOSIS — K7460 Unspecified cirrhosis of liver: Secondary | ICD-10-CM

## 2014-04-17 DIAGNOSIS — R188 Other ascites: Secondary | ICD-10-CM | POA: Diagnosis not present

## 2014-04-17 DIAGNOSIS — Z79899 Other long term (current) drug therapy: Secondary | ICD-10-CM | POA: Diagnosis not present

## 2014-04-17 DIAGNOSIS — Z87891 Personal history of nicotine dependence: Secondary | ICD-10-CM | POA: Insufficient documentation

## 2014-04-17 DIAGNOSIS — Z792 Long term (current) use of antibiotics: Secondary | ICD-10-CM | POA: Diagnosis not present

## 2014-04-17 DIAGNOSIS — R7989 Other specified abnormal findings of blood chemistry: Secondary | ICD-10-CM | POA: Insufficient documentation

## 2014-04-17 DIAGNOSIS — Z862 Personal history of diseases of the blood and blood-forming organs and certain disorders involving the immune mechanism: Secondary | ICD-10-CM | POA: Insufficient documentation

## 2014-04-17 DIAGNOSIS — R609 Edema, unspecified: Secondary | ICD-10-CM | POA: Diagnosis not present

## 2014-04-17 DIAGNOSIS — K219 Gastro-esophageal reflux disease without esophagitis: Secondary | ICD-10-CM | POA: Insufficient documentation

## 2014-04-17 LAB — CBC WITH DIFFERENTIAL/PLATELET
BASOS ABS: 0.1 10*3/uL (ref 0.0–0.1)
Basophils Relative: 1 % (ref 0–1)
EOS ABS: 0.1 10*3/uL (ref 0.0–0.7)
EOS PCT: 1 % (ref 0–5)
HCT: 21.8 % — ABNORMAL LOW (ref 36.0–46.0)
Hemoglobin: 7.5 g/dL — ABNORMAL LOW (ref 12.0–15.0)
Lymphocytes Relative: 13 % (ref 12–46)
Lymphs Abs: 1.6 10*3/uL (ref 0.7–4.0)
MCH: 33.5 pg (ref 26.0–34.0)
MCHC: 34.4 g/dL (ref 30.0–36.0)
MCV: 97.3 fL (ref 78.0–100.0)
Monocytes Absolute: 0.9 10*3/uL (ref 0.1–1.0)
Monocytes Relative: 8 % (ref 3–12)
NEUTROS PCT: 78 % — AB (ref 43–77)
Neutro Abs: 9.7 10*3/uL — ABNORMAL HIGH (ref 1.7–7.7)
Platelets: 262 10*3/uL (ref 150–400)
RBC: 2.24 MIL/uL — ABNORMAL LOW (ref 3.87–5.11)
RDW: 21.9 % — AB (ref 11.5–15.5)
WBC: 12.5 10*3/uL — ABNORMAL HIGH (ref 4.0–10.5)

## 2014-04-17 LAB — URINALYSIS, ROUTINE W REFLEX MICROSCOPIC
GLUCOSE, UA: 100 mg/dL — AB
Hgb urine dipstick: NEGATIVE
Leukocytes, UA: NEGATIVE
Nitrite: POSITIVE — AB
PH: 5 (ref 5.0–8.0)
Protein, ur: 30 mg/dL — AB
Specific Gravity, Urine: 1.025 (ref 1.005–1.030)
Urobilinogen, UA: 4 mg/dL — ABNORMAL HIGH (ref 0.0–1.0)

## 2014-04-17 LAB — COMPREHENSIVE METABOLIC PANEL
ALBUMIN: 2.5 g/dL — AB (ref 3.5–5.2)
ALT: 17 U/L (ref 0–35)
AST: 130 U/L — AB (ref 0–37)
Alkaline Phosphatase: 196 U/L — ABNORMAL HIGH (ref 39–117)
Anion gap: 18 — ABNORMAL HIGH (ref 5–15)
BUN: 42 mg/dL — ABNORMAL HIGH (ref 6–23)
CALCIUM: 8.4 mg/dL (ref 8.4–10.5)
CO2: 25 mEq/L (ref 19–32)
CREATININE: 2.9 mg/dL — AB (ref 0.50–1.10)
Chloride: 92 mEq/L — ABNORMAL LOW (ref 96–112)
GFR calc Af Amer: 22 mL/min — ABNORMAL LOW (ref >90)
GFR calc non Af Amer: 19 mL/min — ABNORMAL LOW (ref >90)
Glucose, Bld: 124 mg/dL — ABNORMAL HIGH (ref 70–99)
Potassium: 3 mEq/L — ABNORMAL LOW (ref 3.7–5.3)
SODIUM: 135 meq/L — AB (ref 137–147)
Total Bilirubin: 10.9 mg/dL — ABNORMAL HIGH (ref 0.3–1.2)
Total Protein: 8.5 g/dL — ABNORMAL HIGH (ref 6.0–8.3)

## 2014-04-17 LAB — BODY FLUID CULTURE: CULTURE: NO GROWTH

## 2014-04-17 LAB — AMMONIA: AMMONIA: 60 umol/L (ref 11–60)

## 2014-04-17 LAB — URINE MICROSCOPIC-ADD ON

## 2014-04-17 MED ORDER — POTASSIUM CHLORIDE CRYS ER 20 MEQ PO TBCR
20.0000 meq | EXTENDED_RELEASE_TABLET | Freq: Once | ORAL | Status: AC
  Administered 2014-04-17: 20 meq via ORAL
  Filled 2014-04-17: qty 1

## 2014-04-17 MED ORDER — VANCOMYCIN HCL 125 MG PO CAPS: 125.0000 mg | ORAL_CAPSULE | Freq: Three times a day (TID) | ORAL | Status: DC

## 2014-04-17 MED ORDER — ONDANSETRON HCL 8 MG PO TABS: 8.0000 mg | ORAL_TABLET | ORAL | Status: DC | PRN

## 2014-04-17 NOTE — ED Notes (Signed)
Gastro office called to inform us that pt was called this morning with a positive C-diff result. MD advised

## 2014-04-17 NOTE — Progress Notes (Signed)
Quick Note:  I called pt and she said she is still having trouble getting liquids down and she is not urinating but just a dribble.   Per Neil Crouch, PA, I told the pt to go to the ED and to tell them she has been diagnosed with C-Diff and she is not urinating but just a dribble and she has abnormal labs.   Pt said that she will get up and get dressed and go on to the ED. ______

## 2014-04-17 NOTE — Discharge Instructions (Signed)
Antibiotic for C. Difficile.    Increase fluids. Medication for nausea. Will culture your urine. Kidney functions are elevated. Followup with kidney Dr. this week. Hold fluid pills for 2 more days

## 2014-04-17 NOTE — ED Notes (Signed)
Patient given discharge instruction, verbalized understand. IV removed, band aid applied. Patient wheelchair out of the department.  

## 2014-04-17 NOTE — ED Notes (Signed)
Pt reports 9/10 pain when standing in ankles "because of the swelling." bilateral ankle swelling noted. Pt reports 7/10 pain in lower right abd where draining was performed.

## 2014-04-17 NOTE — Progress Notes (Signed)
Quick Note:  Please call and check on patient this morning. See how she is doing. If she is weak, having trouble getting in liquids, or still having large amounts of diarrhea, I would have her go to ER.   CDiff is positive. No documentation in chart that on call MD was called by lab and addressed this. Her potassium is low and kidney function worse.  Her Bilirubin is much worse.   If she does not go to ER. , she needs to have labs done again this morning  She needs to start potassium 41meq PO BID for five days now. She used to have some at home, if she is let me know because I will need to increase dose.  Start Flagyl 500mg  PO TID for 14 days. No refills. ______

## 2014-04-17 NOTE — Telephone Encounter (Signed)
I called and LMOM for Wanda [name] to call me in reference to this pt.

## 2014-04-17 NOTE — Telephone Encounter (Signed)
I called the ED and spoke to [facility] and she said the ED physician has put in a call for a GI consult.  I informed Wells Guiles that she is positive for C-Diff and she will make them aware.

## 2014-04-17 NOTE — ED Provider Notes (Signed)
CSN: [id number]     Arrival date & time 04/17/14  1001 History  This chart was scribed for Wanda Christen, MD by Wanda [name], ED Scribe. This patient was seen in room APA04/APA04 and the patient's care was started 11:11 AM.    Chief Complaint  Patient presents with  . fluid retention    The history is provided by the patient. No language interpreter was used.    HPI Comments: Wanda [name] is a 38 y.o. female with past medical history of cirrhosis, acute renal failure who presents to the Emergency Department complaining of abnormal lab results that were performed on 04/12/14. Patient states she also required paracentesis on 04/13/14. Patient states she was notified of the abnormal results this morning which is why she came to the ED for evaluation. Patient states she feels fatigued and nauseated currently. Patient states she has not been urinating normally even though she takes Demadex and Aldactone. She denies chest pain, vomiting.   Past Medical History  Diagnosis Date  . GERD (gastroesophageal reflux disease)   . 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 c/w steatohepatitis  . Acute renal failure 09/2013    Pre-renal- resolved  . Folate deficiency 09/2013  . Anasarca 10/10/2013  . Hematemesis/vomiting blood 02/24/2014  . Acute blood loss anemia 02/25/2014    Status post transfusion  . Macrocytosis 02/28/2014  . Bleeding esophageal varices 02/28/2014    s/p banding   Past Surgical History  Procedure Laterality Date  . None    . Paracentesis  Feb 2015    1180 fluid, negative fluid analysis.   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 Paracentesis  10/2013  . Colonoscopy N/A 12/19/2013    SLF:NO OBVIOUS SOURCE FOR ANEMIA IDETIFIED/ONE COLON POLYP REMOVED/Small internal hemorrhoids  . Esophagogastroduodenoscopy N/A 02/11/2014     EXN:[id number] varices with bleeding stigmata-status post esophageal band ligation therapy. Portal gastropathy   Family History  Problem Relation Age of Onset  . Heart disease Mother   . Colon cancer Neg Hx   . Liver disease Neg Hx    History  Substance Use Topics  . Smoking status: Former Smoker -- 0.25 packs/day for 20 years    Types: Cigarettes    Quit date: 11/05/2008  . Smokeless tobacco: Never Used  . Alcohol Use: No   OB History   Grav Para Term Preterm Abortions TAB SAB Ect Mult Living   1 1 1       1      Review of Systems  A complete 10 system review of systems was obtained and all systems are negative except as noted in the HPI and PMH.    Allergies  Lasix  Home Medications   Prior to Admission medications   Medication Sig Start Date End Date Taking? Authorizing Provider  oxyCODONE (ROXICODONE) 5 MG immediate release tablet Take 1 tablet (5 mg total) by mouth every 6 (six) hours as needed for severe pain. 04/12/14  Yes Mahala Menghini, PA-C  pantoprazole (PROTONIX) 40 MG tablet Take 1 tablet (40 mg total) by mouth 2 (two) times daily before a meal. For management of GI bleeding. 03/02/14  Yes Rexene Alberts, MD  potassium chloride (MICRO-K) 10 MEQ CR capsule Take 10 mEq by mouth 2 (two) times daily.   Yes Historical Provider, MD  spironolactone (ALDACTONE) 25 MG tablet Take 1  tablet (25 mg total) by mouth daily. For treatment of swelling 03/02/14  Yes Rexene Alberts, MD  torsemide (DEMADEX) 20 MG tablet Take 2 tablets (40 mg total) by mouth daily. Take for treatment of swelling. 03/03/14  Yes Rexene Alberts, MD  ondansetron (ZOFRAN) 8 MG tablet Take 1 tablet (8 mg total) by mouth every 4 (four) hours as needed. 04/17/14   Wanda Christen, MD  vancomycin (VANCOCIN) 125 MG capsule Take 1 capsule (125 mg total) by mouth 3 (three) times daily. 04/17/14   Wanda Christen, MD   BP 98/52  Pulse 124  Temp(Src) 97.1 F (36.2 C) (Oral)  Resp 18  Ht 5\' 2"  (1.575 m)  Wt 135 lb (61.236 kg)   BMI 24.69 kg/m2  SpO2 98% Physical Exam  Nursing note and vitals reviewed. Constitutional: She is oriented to person, place, and time. She appears well-developed and well-nourished.  Jaundiced appearing  HENT:  Head: Normocephalic and atraumatic.  Eyes: Conjunctivae and EOM are normal. Pupils are equal, round, and reactive to light.  Neck: Normal range of motion. Neck supple.  Cardiovascular: Normal rate, regular rhythm and normal heart sounds.   Pulmonary/Chest: Effort normal and breath sounds normal.  Abdominal: Soft. Bowel sounds are normal. She exhibits ascites.  Musculoskeletal: Normal range of motion. She exhibits edema (2+ peripheral edema).  Neurological: She is alert and oriented to person, place, and time.  Skin: Skin is warm and dry.  Psychiatric: She has a normal mood and affect. Her behavior is normal.    ED Course  Procedures (including critical care time)  DIAGNOSTIC STUDIES: Oxygen Saturation is 100% on RA, normal by my interpretation.    COORDINATION OF CARE: 11:15 AM Will order lab work and EKG. Discussed treatment plan with pt at bedside and pt agreed to plan.   Labs Review Labs Reviewed  CBC WITH DIFFERENTIAL - Abnormal; Notable for the following:    WBC 12.5 (*)    RBC 2.24 (*)    Hemoglobin 7.5 (*)    HCT 21.8 (*)    RDW 21.9 (*)    Neutrophils Relative % 78 (*)    Neutro Abs 9.7 (*)    All other components within normal limits  COMPREHENSIVE METABOLIC PANEL - Abnormal; Notable for the following:    Sodium 135 (*)    Potassium 3.0 (*)    Chloride 92 (*)    Glucose, Bld 124 (*)    BUN 42 (*)    Creatinine, Ser 2.90 (*)    Total Protein 8.5 (*)    Albumin 2.5 (*)    AST 130 (*)    Alkaline Phosphatase 196 (*)    Total Bilirubin 10.9 (*)    GFR calc non Af Amer 19 (*)    GFR calc Af Amer 22 (*)    Anion gap 18 (*)    All other components within normal limits  URINALYSIS, ROUTINE W REFLEX MICROSCOPIC - Abnormal; Notable for the following:     Glucose, UA 100 (*)    Bilirubin Urine LARGE (*)    Ketones, ur TRACE (*)    Protein, ur 30 (*)    Urobilinogen, UA 4.0 (*)    Nitrite POSITIVE (*)    All other components within normal limits  URINE MICROSCOPIC-ADD ON - Abnormal; Notable for the following:    Squamous Epithelial / LPF FEW (*)    Bacteria, UA MANY (*)    Casts GRANULAR CAST (*)    All other components within normal limits  URINE CULTURE  AMMONIA    Imaging Review No results found.   EKG Interpretation   Date/Time:  Tuesday April 17 2014 10:25:26 EDT Ventricular Rate:  121 PR Interval:    QRS Duration: 71 QT Interval:  421 QTC Calculation: 597 R Axis:   72 Text Interpretation:  Atrial fibrillation Borderline repolarization  abnormality Prolonged QT interval Baseline wander in lead(s) V5 Confirmed  by Natsha Guidry  MD, Errick Salts ([id number]) on 04/17/2014 12:48:33 PM      MDM   Final diagnoses:  Hepatic cirrhosis, unspecified hepatic cirrhosis type  Renal insufficiency   Patient has a very serious chronic health problem in her liver disease and ascites. Status post paracentesis past Friday.  She is complaining of mild edema but no chest pain or shortness of breath. She would have not come to the emergency department today if it wasn't for her doctor's office suggesting the visit for a positive C. difficile culture. She is alert. No obvious dyspnea. Tachycardia noted. She is hemodynamically stable. No sign of sepsis. Will Rx vancomycin for C. Difficile.  Discussed with her gastroenterologist Dr. Oneida Alar. Also discussed her elevated creatinine. Discussed the serious nature of her illness with the patient and her significant other. She will followup with her gastroenterologist and nephrologist.   I personally performed the services described in this documentation, which was scribed in my presence. The recorded information has been reviewed and is accurate.   Wanda Christen, MD 04/18/14 [phone]

## 2014-04-17 NOTE — Telephone Encounter (Signed)
Routing to Wanda [name], this is a [facility] pt. 

## 2014-04-17 NOTE — Telephone Encounter (Signed)
Wanda [name], please follow up on liver biopsy result for copper. I don't see any addition to the previous results as they indicated they would do.

## 2014-04-17 NOTE — Telephone Encounter (Signed)
Please notify ER that patient tested positive for C.Diff. She has not been provided with prescription since she went to ER. Thanks!

## 2014-04-17 NOTE — ED Notes (Signed)
Sent by PCP for abnormal lab work.  States was told it was CHF.

## 2014-04-18 ENCOUNTER — Other Ambulatory Visit: Payer: Self-pay

## 2014-04-18 ENCOUNTER — Telehealth: Payer: Self-pay | Admitting: Gastroenterology

## 2014-04-18 ENCOUNTER — Telehealth: Payer: Self-pay | Admitting: Internal Medicine

## 2014-04-18 DIAGNOSIS — K7460 Unspecified cirrhosis of liver: Secondary | ICD-10-CM

## 2014-04-18 DIAGNOSIS — R188 Other ascites: Principal | ICD-10-CM

## 2014-04-18 NOTE — Telephone Encounter (Signed)
Report received and placed on Neil Crouch, PA's desk.

## 2014-04-18 NOTE — Telephone Encounter (Signed)
Order has been faxed

## 2014-04-18 NOTE — Telephone Encounter (Signed)
Can we also check her CBC STAT as well. Thanks.

## 2014-04-18 NOTE — Telephone Encounter (Signed)
Copper content in liver tissues within normal reference range. See 11/23/13 molecular pathology report under labs.

## 2014-04-18 NOTE — Telephone Encounter (Signed)
Lab orders have been faxed to [facility] for pt to do STAT on 04/20/2014. I will call her in the morning for progress report and remind her to go early on Friday to the lab.

## 2014-04-18 NOTE — Telephone Encounter (Signed)
[facility] called regarding patient and labs.  Please call back at [phone]

## 2014-04-18 NOTE — Telephone Encounter (Signed)
Spoke to [place] at [facility] and she will fax over the info after she has a meeting this AM.

## 2014-04-18 NOTE — Telephone Encounter (Signed)
Spoke with patient. No vomiting. Taking Zofran for nausea. Has consumed about 6 ounces of fluid this morning. Poor urine output. She is waiting on the vancomycin to arrive at pharmacy, had to be ordered. She is holding torsemide and aldactone per ER recommendation for next couple of days. Her boyfriend placed a call to Dr. Gaston Islam for recommendations.   Discussed referral to transplant center again with patient and she is still undecided.   Offered her to go back to ER for reevaluation today but she would like to try to stay at home today and give meds chance to work.  Discussed with her, if urine output decreases more, she has lightheadedness, confusion, etc. She should go back to ER for admission.   Advised of ways to improve hydration. Hold lactulose until vancomycin complete.   She needs to have CMET, CBC on Friday morning, order STAT please.   Please call patient in AM to get progress report and remind her to do her labs. Thanks!

## 2014-04-18 NOTE — Telephone Encounter (Signed)
See phone note dated 03/07/2014.

## 2014-04-19 ENCOUNTER — Encounter (HOSPITAL_COMMUNITY): Payer: Self-pay | Admitting: Emergency Medicine

## 2014-04-19 ENCOUNTER — Emergency Department (HOSPITAL_COMMUNITY): Payer: Medicare Other

## 2014-04-19 ENCOUNTER — Inpatient Hospital Stay (HOSPITAL_COMMUNITY)
Admission: EM | Admit: 2014-04-19 | Discharge: 2014-04-27 | DRG: 872 | Disposition: A | Discharge status: Home / Self Care | Payer: Medicare Other | Attending: Internal Medicine | Admitting: Internal Medicine

## 2014-04-19 DIAGNOSIS — Z8249 Family history of ischemic heart disease and other diseases of the circulatory system: Secondary | ICD-10-CM

## 2014-04-19 DIAGNOSIS — E860 Dehydration: Secondary | ICD-10-CM | POA: Diagnosis present

## 2014-04-19 DIAGNOSIS — K7031 Alcoholic cirrhosis of liver with ascites: Secondary | ICD-10-CM | POA: Diagnosis present

## 2014-04-19 DIAGNOSIS — I8510 Secondary esophageal varices without bleeding: Secondary | ICD-10-CM | POA: Diagnosis present

## 2014-04-19 DIAGNOSIS — F319 Bipolar disorder, unspecified: Secondary | ICD-10-CM | POA: Diagnosis present

## 2014-04-19 DIAGNOSIS — G8929 Other chronic pain: Secondary | ICD-10-CM | POA: Diagnosis present

## 2014-04-19 DIAGNOSIS — R17 Unspecified jaundice: Secondary | ICD-10-CM

## 2014-04-19 DIAGNOSIS — W19XXXA Unspecified fall, initial encounter: Secondary | ICD-10-CM | POA: Diagnosis present

## 2014-04-19 DIAGNOSIS — A0472 Enterocolitis due to Clostridium difficile, not specified as recurrent: Secondary | ICD-10-CM | POA: Diagnosis present

## 2014-04-19 DIAGNOSIS — I498 Other specified cardiac arrhythmias: Secondary | ICD-10-CM | POA: Diagnosis present

## 2014-04-19 DIAGNOSIS — K219 Gastro-esophageal reflux disease without esophagitis: Secondary | ICD-10-CM | POA: Diagnosis present

## 2014-04-19 DIAGNOSIS — K7689 Other specified diseases of liver: Secondary | ICD-10-CM | POA: Diagnosis present

## 2014-04-19 DIAGNOSIS — J209 Acute bronchitis, unspecified: Secondary | ICD-10-CM | POA: Diagnosis not present

## 2014-04-19 DIAGNOSIS — A419 Sepsis, unspecified organism: Secondary | ICD-10-CM | POA: Diagnosis present

## 2014-04-19 DIAGNOSIS — N189 Chronic kidney disease, unspecified: Secondary | ICD-10-CM | POA: Diagnosis present

## 2014-04-19 DIAGNOSIS — N179 Acute kidney failure, unspecified: Secondary | ICD-10-CM

## 2014-04-19 DIAGNOSIS — D649 Anemia, unspecified: Secondary | ICD-10-CM

## 2014-04-19 DIAGNOSIS — Y9210 Unspecified residential institution as the place of occurrence of the external cause: Secondary | ICD-10-CM | POA: Diagnosis present

## 2014-04-19 DIAGNOSIS — K769 Liver disease, unspecified: Secondary | ICD-10-CM

## 2014-04-19 DIAGNOSIS — I8501 Esophageal varices with bleeding: Secondary | ICD-10-CM

## 2014-04-19 DIAGNOSIS — E876 Hypokalemia: Secondary | ICD-10-CM | POA: Diagnosis present

## 2014-04-19 DIAGNOSIS — Z87891 Personal history of nicotine dependence: Secondary | ICD-10-CM

## 2014-04-19 DIAGNOSIS — E872 Acidosis, unspecified: Secondary | ICD-10-CM | POA: Diagnosis present

## 2014-04-19 DIAGNOSIS — D638 Anemia in other chronic diseases classified elsewhere: Secondary | ICD-10-CM | POA: Diagnosis present

## 2014-04-19 DIAGNOSIS — E8809 Other disorders of plasma-protein metabolism, not elsewhere classified: Secondary | ICD-10-CM | POA: Diagnosis present

## 2014-04-19 DIAGNOSIS — D7589 Other specified diseases of blood and blood-forming organs: Secondary | ICD-10-CM

## 2014-04-19 DIAGNOSIS — R000 Tachycardia, unspecified: Secondary | ICD-10-CM | POA: Diagnosis present

## 2014-04-19 DIAGNOSIS — K652 Spontaneous bacterial peritonitis: Secondary | ICD-10-CM

## 2014-04-19 DIAGNOSIS — A414 Sepsis due to anaerobes: Secondary | ICD-10-CM | POA: Diagnosis present

## 2014-04-19 DIAGNOSIS — R188 Other ascites: Secondary | ICD-10-CM | POA: Diagnosis present

## 2014-04-19 DIAGNOSIS — K7460 Unspecified cirrhosis of liver: Secondary | ICD-10-CM | POA: Diagnosis present

## 2014-04-19 DIAGNOSIS — R197 Diarrhea, unspecified: Secondary | ICD-10-CM | POA: Diagnosis present

## 2014-04-19 DIAGNOSIS — K7581 Nonalcoholic steatohepatitis (NASH): Secondary | ICD-10-CM

## 2014-04-19 DIAGNOSIS — D62 Acute posthemorrhagic anemia: Secondary | ICD-10-CM

## 2014-04-19 DIAGNOSIS — R601 Generalized edema: Secondary | ICD-10-CM

## 2014-04-19 DIAGNOSIS — T380X5A Adverse effect of glucocorticoids and synthetic analogues, initial encounter: Secondary | ICD-10-CM | POA: Diagnosis present

## 2014-04-19 DIAGNOSIS — K529 Noninfective gastroenteritis and colitis, unspecified: Secondary | ICD-10-CM

## 2014-04-19 DIAGNOSIS — E538 Deficiency of other specified B group vitamins: Secondary | ICD-10-CM

## 2014-04-19 HISTORY — DX: Enterocolitis due to Clostridium difficile, not specified as recurrent: A04.72

## 2014-04-19 LAB — LIPASE, BLOOD: LIPASE: 79 U/L — AB (ref 11–59)

## 2014-04-19 LAB — CBC WITH DIFFERENTIAL/PLATELET
BASOS ABS: 0.1 10*3/uL (ref 0.0–0.1)
Basophils Relative: 1 % (ref 0–1)
Eosinophils Absolute: 0.1 10*3/uL (ref 0.0–0.7)
Eosinophils Relative: 1 % (ref 0–5)
HEMATOCRIT: 18.7 % — AB (ref 36.0–46.0)
HEMOGLOBIN: 6.5 g/dL — AB (ref 12.0–15.0)
LYMPHS PCT: 14 % (ref 12–46)
Lymphs Abs: 2 10*3/uL (ref 0.7–4.0)
MCH: 34 pg (ref 26.0–34.0)
MCHC: 34.8 g/dL (ref 30.0–36.0)
MCV: 97.9 fL (ref 78.0–100.0)
Monocytes Absolute: 1.4 10*3/uL — ABNORMAL HIGH (ref 0.1–1.0)
Monocytes Relative: 10 % (ref 3–12)
NEUTROS ABS: 10.7 10*3/uL — AB (ref 1.7–7.7)
Neutrophils Relative %: 74 % (ref 43–77)
Platelets: 254 10*3/uL (ref 150–400)
RBC: 1.91 MIL/uL — ABNORMAL LOW (ref 3.87–5.11)
RDW: 23 % — AB (ref 11.5–15.5)
WBC: 14.3 10*3/uL — AB (ref 4.0–10.5)

## 2014-04-19 LAB — PREPARE RBC (CROSSMATCH)

## 2014-04-19 LAB — COMPREHENSIVE METABOLIC PANEL
ALT: 19 U/L (ref 0–35)
AST: 128 U/L — AB (ref 0–37)
Albumin: 2.2 g/dL — ABNORMAL LOW (ref 3.5–5.2)
Alkaline Phosphatase: 185 U/L — ABNORMAL HIGH (ref 39–117)
Anion gap: 19 — ABNORMAL HIGH (ref 5–15)
BILIRUBIN TOTAL: 8.6 mg/dL — AB (ref 0.3–1.2)
BUN: 44 mg/dL — ABNORMAL HIGH (ref 6–23)
CALCIUM: 7.5 mg/dL — AB (ref 8.4–10.5)
CHLORIDE: 93 meq/L — AB (ref 96–112)
CO2: 22 mEq/L (ref 19–32)
CREATININE: 4.13 mg/dL — AB (ref 0.50–1.10)
GFR calc Af Amer: 15 mL/min — ABNORMAL LOW (ref >90)
GFR calc non Af Amer: 13 mL/min — ABNORMAL LOW (ref >90)
Glucose, Bld: 132 mg/dL — ABNORMAL HIGH (ref 70–99)
Potassium: 2.9 mEq/L — CL (ref 3.7–5.3)
Sodium: 134 mEq/L — ABNORMAL LOW (ref 137–147)
Total Protein: 7.9 g/dL (ref 6.0–8.3)

## 2014-04-19 LAB — URINALYSIS, ROUTINE W REFLEX MICROSCOPIC
GLUCOSE, UA: 100 mg/dL — AB
Hgb urine dipstick: NEGATIVE
Leukocytes, UA: NEGATIVE
Nitrite: POSITIVE — AB
PH: 5 (ref 5.0–8.0)
Protein, ur: 30 mg/dL — AB
Specific Gravity, Urine: 1.015 (ref 1.005–1.030)
Urobilinogen, UA: 2 mg/dL — ABNORMAL HIGH (ref 0.0–1.0)

## 2014-04-19 LAB — URINE MICROSCOPIC-ADD ON

## 2014-04-19 LAB — ANAEROBIC CULTURE

## 2014-04-19 LAB — LACTIC ACID, PLASMA: Lactic Acid, Venous: 2.2 mmol/L (ref 0.5–2.2)

## 2014-04-19 LAB — PROTIME-INR
INR: 1.55 — AB (ref 0.00–1.49)
PROTHROMBIN TIME: 18.6 s — AB (ref 11.6–15.2)

## 2014-04-19 LAB — AMMONIA: AMMONIA: 69 umol/L — AB (ref 11–60)

## 2014-04-19 LAB — PREGNANCY, URINE: PREG TEST UR: NEGATIVE

## 2014-04-19 MED ORDER — VANCOMYCIN 50 MG/ML ORAL SOLUTION
ORAL | Status: AC
  Filled 2014-04-19: qty 10

## 2014-04-19 MED ORDER — PANTOPRAZOLE SODIUM 40 MG IV SOLR
40.0000 mg | Freq: Two times a day (BID) | INTRAVENOUS | Status: DC
  Administered 2014-04-20 – 2014-04-25 (×12): 40 mg via INTRAVENOUS
  Filled 2014-04-19 (×12): qty 40

## 2014-04-19 MED ORDER — VANCOMYCIN 50 MG/ML ORAL SOLUTION
125.0000 mg | Freq: Four times a day (QID) | ORAL | Status: DC
  Administered 2014-04-20 – 2014-04-27 (×31): 125 mg via ORAL
  Filled 2014-04-19 (×50): qty 2.5

## 2014-04-19 MED ORDER — ONDANSETRON HCL 4 MG PO TABS
8.0000 mg | ORAL_TABLET | ORAL | Status: DC | PRN
  Administered 2014-04-27: 8 mg via ORAL
  Filled 2014-04-19: qty 2

## 2014-04-19 MED ORDER — SODIUM CHLORIDE 0.9 % IV SOLN: INTRAVENOUS | Status: DC

## 2014-04-19 MED ORDER — SODIUM CHLORIDE 0.9 % IV SOLN
Freq: Once | INTRAVENOUS | Status: AC
  Administered 2014-04-20: via INTRAVENOUS

## 2014-04-19 MED ORDER — OXYCODONE HCL 5 MG PO TABS
5.0000 mg | ORAL_TABLET | Freq: Four times a day (QID) | ORAL | Status: DC | PRN
  Administered 2014-04-20 – 2014-04-23 (×13): 5 mg via ORAL
  Filled 2014-04-19 (×14): qty 1

## 2014-04-19 MED ORDER — SODIUM CHLORIDE 0.9 % IV SOLN
INTRAVENOUS | Status: DC
  Administered 2014-04-19 – 2014-04-21 (×6): via INTRAVENOUS

## 2014-04-19 MED ORDER — POTASSIUM CHLORIDE 20 MEQ/15ML (10%) PO LIQD
40.0000 meq | Freq: Once | ORAL | Status: AC
  Administered 2014-04-20: 40 meq via ORAL
  Filled 2014-04-19: qty 30

## 2014-04-19 MED ORDER — DEXTROSE 5 % IV SOLN
1.0000 g | INTRAVENOUS | Status: DC
  Administered 2014-04-20 – 2014-04-22 (×4): 1 g via INTRAVENOUS
  Filled 2014-04-19 (×3): qty 10

## 2014-04-19 MED ORDER — POTASSIUM CHLORIDE 20 MEQ/15ML (10%) PO LIQD
40.0000 meq | Freq: Once | ORAL | Status: AC
  Administered 2014-04-19: 40 meq via ORAL
  Filled 2014-04-19: qty 30

## 2014-04-19 MED ORDER — SODIUM CHLORIDE 0.9 % IV BOLUS (SEPSIS)
500.0000 mL | Freq: Once | INTRAVENOUS | Status: AC
  Administered 2014-04-19: 500 mL via INTRAVENOUS

## 2014-04-19 MED ORDER — SODIUM CHLORIDE 0.9 % IV SOLN
10.0000 mL/h | Freq: Once | INTRAVENOUS | Status: AC
  Administered 2014-04-20: 10 mL/h via INTRAVENOUS

## 2014-04-19 MED ORDER — SODIUM CHLORIDE 0.9 % IJ SOLN
3.0000 mL | Freq: Two times a day (BID) | INTRAMUSCULAR | Status: DC
  Administered 2014-04-20 – 2014-04-27 (×13): 3 mL via INTRAVENOUS

## 2014-04-19 NOTE — ED Notes (Signed)
CRITICAL VALUE ALERT  Critical value received:  Potassium 2.5  Date of notification:  04/19/14  Time of notification:  2129  Critical value read back:Yes.    Nurse who received alert:  Angela Burke  MD notified (1st page):  2130  Time of first page:    MD notified (2nd page):  Time of second page:  Responding MD:  Dr. Thurnell Garbe

## 2014-04-19 NOTE — Telephone Encounter (Signed)
I called pt and she said she is still very weak and still having a lot of diarrhea. She took in 20 oz of coke yesterday and a little less than 1/2 of a meatloaf sandwich. She urinated a little x 3 , it was a little more than a dribble. The Zofran is not helping her nausea, but she has not vomited. She said her boyfriend got a call in to the other doctor but they did not get a return call. I reminded her to do labs tomorrow morning and try to go early, and she said she did not believe she could go because she is having so much diarrhea. I told her that [name] has to have the labs to check on her or she will be back at the ED. Pt sounds very weak. I told her I will inform [name] and call with any recommendations.

## 2014-04-19 NOTE — Telephone Encounter (Signed)
I called and informed pt. She said she couldn't go now, doesn't have a ride but he ( ?) will be back in a little while. I encouraged her to go and she never would commit to go, just said OK.

## 2014-04-19 NOTE — Progress Notes (Signed)
ANTIBIOTIC CONSULT NOTE-Preliminary  Pharmacy Consult for Rocephin Indication: Urinary Tract Infection   Allergies  Allergen Reactions  . Lasix [Furosemide]     "doesn't work"    Patient Measurements: Height: 5\' 2"  (157.5 cm) Weight: 140 lb (63.504 kg) IBW/kg (Calculated) : 50.1  Vital Signs: Temp: 98 F (36.7 C) (09/10 2037) Temp src: Oral (09/10 2037) BP: 105/63 mmHg (09/10 2037) Pulse Rate: 117 (09/10 2037)  Labs:  Recent Labs  04/17/14 1110 04/19/14 2049  WBC 12.5* 14.3*  HGB 7.5* 6.5*  PLT 262 254  CREATININE 2.90* 4.13*    Estimated Creatinine Clearance: 16 ml/min (by C-G formula based on Cr of 4.13).  No results found for this basename: VANCOTROUGH, Corlis Leak, VANCORANDOM, GENTTROUGH, GENTPEAK, GENTRANDOM, TOBRATROUGH, TOBRAPEAK, TOBRARND, AMIKACINPEAK, AMIKACINTROU, AMIKACIN,  in the last 72 hours   Microbiology: Recent Results (from the past 720 hour(s))  CLOSTRIDIUM DIFFICILE BY PCR     Status: Abnormal   Collection Time    04/12/14  9:40 PM      Result Value Ref Range Status   C difficile by pcr Detected (*) Not Detected Final   Comment: This test is for use only with liquid or soft stools;     performance characteristics of other clinical specimen     types have not been established.           This assay was performed by Cepheid GeneXpert(R) PCR.     The performance characteristics of this assay have     been determined by [facility]. Performance     characteristics refer to the analytical performance     of the test.  ANAEROBIC CULTURE     Status: None   Collection Time    04/13/14  3:25 PM      Result Value Ref Range Status   Specimen Description ASCITIC FLUID   Final   Special Requests NONE   Final   Gram Stain     Final   Value: FEW WBC PRESENT, PREDOMINANTLY MONONUCLEAR     NO ORGANISMS SEEN     Performed at [facility]   Culture     Final   Value: NO ANAEROBES ISOLATED     Performed at [facility]   Report Status 04/19/2014 FINAL   Final  BODY FLUID CULTURE     Status: None   Collection Time    04/13/14  3:25 PM      Result Value Ref Range Status   Specimen Description PERITONEAL FLUID   Final   Special Requests NONE   Final   Gram Stain     Final   Value: FEW WBC PRESENT, PREDOMINANTLY MONONUCLEAR     NO ORGANISMS SEEN     Performed at [facility]   Culture     Final   Value: NO GROWTH 3 DAYS     Performed at [facility]   Report Status 04/17/2014 FINAL   Final  URINE CULTURE     Status: None   Collection Time    04/17/14 12:12 PM      Result Value Ref Range Status   Specimen Description URINE, CLEAN CATCH   Final   Special Requests IMMUNE:COMPROMISED   Final   Culture  Setup Time     Final   Value: 04/17/2014 18:58     Performed at [facility]     Final   Value: 65,000 COLONIES/ML     Performed at  [facility]     Final   Value: ENTEROCOCCUS SPECIES     Performed at [facility]   Report Status PENDING   Incomplete    Medical History: Past Medical History  Diagnosis Date  . GERD (gastroesophageal reflux disease)   . 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 c/w steatohepatitis  . Folate deficiency 09/2013  . Anasarca 10/10/2013  . Hematemesis/vomiting blood 02/24/2014  . Acute blood loss anemia 02/25/2014    Status post transfusion  . Macrocytosis 02/28/2014  . 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 Acute renal failure 09/2013    Pre-renal- resolved    Medications:   (Not in a hospital admission) Scheduled:  . potassium chloride  40 mEq Oral Once  . sodium chloride  3 mL Intravenous Q12H    Assessment: Okay for Protocol  Goal of Therapy:  Eradicate infection.  Plan:  Preliminary review of pertinent patient information completed.  Protocol will be initiated with Rocephin 1gm IV every 24 hours.  Forestine Na clinical pharmacist will  complete review during morning rounds to assess patient and finalize treatment regimen.  Pricilla Larsson, [facility] 04/19/2014,10:24 PM

## 2014-04-19 NOTE — H&P (Signed)
History and Physical    Wanda [name] BMW:[id number] DOB: 1975-09-11 DOA: 04/19/2014  Referring physician: Dr. Thurnell Garbe PCP: Default, Provider, MD  Specialists: none   Chief Complaint: Weakness and diarrhea  HPI: Wanda [name] is a 38 y.o. female has a past medical history significant for NASH cirrhosis, decompensated with history of GI bleed due to esophageal varices, recurrent ascites with a recent paracentesis last week with 5 L of fluid removed, coagulopathy, acute on chronic renal failure, presents to the emergency room with the chief complaint of weakness, dehydration and ongoing diarrhea. She was seen on September 3 as an outpatient for her diarrhea, and a stool sample was positive for C. difficile. She was started on by mouth metronidazole, however was unable to fill it out that the pharmacy when she got the prescription because the pharmacy needed to order it per patient. She was able to get it filled yesterday and she only took 3-4 doses until now. She describes watery perfuse diarrhea which is almost "continuous". She also endorses nausea and intermittent vomiting. She denies any blood in her emesis or blood in her stool, she denies any coffee ground material, she denies any dark tarry stools. She has no fever or chills, she has no shortness of breath or chest pain. She endorses mild lightheadedness when she stands up. She endorses decreased urinary output for the last few days. She endorses abdominal distention and should have paracenteses last week, she has been getting progressively distended in the last week, does not feel tense or to the point where her breathing is impaired. In the emergency room, she was found to be in acute on chronic renal failure with an elevated creatinine, was found to be hypokalemic, and also anemic with a hemoglobin of 6.5. She has an elevated bilirubin at 8.6, and an INR of 1.5. [facility] asked for admission for severe dehydration and ongoing diarrhea.  Review of  Systems: As per history of present illness, otherwise negative  Past Medical History  Diagnosis Date  . GERD (gastroesophageal reflux disease)   . 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 c/w steatohepatitis  . Folate deficiency 09/2013  . Anasarca 10/10/2013  . Hematemesis/vomiting blood 02/24/2014  . Acute blood loss anemia 02/25/2014    Status post transfusion  . Macrocytosis 02/28/2014  . 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 Acute renal failure 09/2013    Pre-renal- resolved   Past Surgical History  Procedure Laterality Date  . None    . Paracentesis  Feb 2015    1180 fluid, negative fluid analysis.   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 Paracentesis  10/2013  . Colonoscopy N/A 12/19/2013    SLF:NO OBVIOUS SOURCE FOR ANEMIA IDETIFIED/ONE COLON POLYP REMOVED/Small internal hemorrhoids  . Esophagogastroduodenoscopy N/A 02/11/2014    UVO:[id number] varices with bleeding stigmata-status post esophageal band ligation therapy. Portal gastropathy   Social History:  reports that she quit smoking about 5 years ago. Her smoking use included Cigarettes. She has a 5 pack-year smoking history. She has never used smokeless tobacco. She reports that she does not drink alcohol or use illicit drugs.  Allergies  Allergen Reactions  . Lasix [Furosemide]     "doesn't work"    Family History  Problem Relation Age of Onset  . Heart disease Mother   . Colon  cancer Neg Hx   . Liver disease Neg Hx    Prior to Admission medications   Medication Sig Start Date End Date Taking? Authorizing Provider  ondansetron (ZOFRAN) 8 MG tablet Take 1 tablet (8 mg total) by mouth every 4 (four) hours as needed. 04/17/14  Yes [name], MD  oxyCODONE (ROXICODONE) 5 MG immediate release tablet Take 1 tablet (5 mg total)  by mouth every 6 (six) hours as needed for severe pain. 04/12/14  Yes Mahala Menghini, PA-C  pantoprazole (PROTONIX) 40 MG tablet Take 1 tablet (40 mg total) by mouth 2 (two) times daily before a meal. For management of GI bleeding. 03/02/14  Yes Rexene Alberts, MD  potassium chloride (MICRO-K) 10 MEQ CR capsule Take 10 mEq by mouth 2 (two) times daily.   Yes Historical Provider, MD  spironolactone (ALDACTONE) 25 MG tablet Take 1 tablet (25 mg total) by mouth daily. For treatment of swelling 03/02/14  Yes Rexene Alberts, MD  torsemide (DEMADEX) 20 MG tablet Take 2 tablets (40 mg total) by mouth daily. Take for treatment of swelling. 03/03/14  Yes Rexene Alberts, MD  vancomycin (VANCOCIN) 125 MG capsule Take 1 capsule (125 mg total) by mouth 3 (three) times daily. 04/17/14  Yes [name], MD   Physical Exam: Filed Vitals:   04/19/14 1627 04/19/14 1635 04/19/14 1820 04/19/14 2037  BP: 108/51 90/74 95/51  105/63  Pulse: 115 112 112 117  Temp: 97.6 F (36.4 C) 98 F (36.7 C)  98 F (36.7 C)  TempSrc: Oral Oral  Oral  Resp: 20 20 20 18   Height: 5\' 2"  (1.575 m) 5\' 2"  (1.575 m)    Weight: 63.504 kg (140 lb) 63.504 kg (140 lb)    SpO2: 99% 100% 98% 99%     General:  No apparent distress, visibly jaundiced  ENT: dry oropharynx  Neck: supple, no JVD  Cardiovascular: regular rate without MRG; 2+ peripheral pulses  Respiratory: CTA biL, good air movement without wheezing, rhonchi or crackled  Abdomen: Soft, mildly distended, nontender to palpation, positive bowel sounds, clinically with ascites  Skin: no rashes  Musculoskeletal: no peripheral edema  Neurologic: Grossly nonfocal  Labs on Admission:  Basic Metabolic Panel:  Recent Labs Lab 04/17/14 1110 04/19/14 2049  NA 135* 134*  K 3.0* 2.9*  CL 92* 93*  CO2 25 22  GLUCOSE 124* 132*  BUN 42* 44*  CREATININE 2.90* 4.13*  CALCIUM 8.4 7.5*   Liver Function Tests:  Recent Labs Lab 04/17/14 1110 04/19/14 2049  AST 130* 128*  ALT  17 19  ALKPHOS 196* 185*  BILITOT 10.9* 8.6*  PROT 8.5* 7.9  ALBUMIN 2.5* 2.2*    Recent Labs Lab 04/19/14 2049  LIPASE 79*    Recent Labs Lab 04/17/14 1143 04/19/14 2049  AMMONIA 60 69*   CBC:  Recent Labs Lab 04/17/14 1110 04/19/14 2049  WBC 12.5* 14.3*  NEUTROABS 9.7* 10.7*  HGB 7.5* 6.5*  HCT 21.8* 18.7*  MCV 97.3 97.9  PLT 262 254   Radiological Exams on Admission: No results found.  EKG: Independently reviewed.  Assessment/Plan Active Problems:   Colitis   Ascites   Acute renal failure   Sinus tachycardia   Anemia   Hypokalemia   C. difficile diarrhea   #1 C. difficile diarrhea - diagnosed about a week ago, however patient started antibiotics a day ago. She is very dehydrated in renal failure at this point, will start by mouth vancomycin. Infectious stool studies re-sent per EDP.  #  2 Acute on chronic renal failure - she is clinically dehydrated, we'll provide IV fluids overnight as well as blood transfusion, repeat renal function in the morning. Last time she was in the hospital nephrology was consulted and is being followed as an outpatient. If renal function is not improving with hydration, may need to reconsult. She is also hypotensive which may contribute. She has advanced liver disease and she is at risk for hepatorenal syndrome.  #3 NASH cirrhosis - with evidence of decompensation with coagulopathy, ascites, elevated bilirubin. MELD score on admission 33, which unfortunately places her at about 50% mortality in the next 3 months. Will hold home diuretics for now. May need paracentesis in couple of days.  #4 hypokalemia - due to #1, replete and recheck in the morning.  #5 possible urinary tract infection, may also be due to concentrated urine, will start empiric ceftriaxone, discontinue antibiotics if urine culture is negative especially in the light of #1  #6 - anemia - likely multifactorial due to liver disease, renal failure. Clinically there is  no evidence of a GI bleed currently, we'll continue PPI, obtain a fecal occult, transfuse 2 units of packed red blood cells for now. Her platelets are within normal limits.  #7 Leukocytosis - likely due to infectious diarrhea, continue oral vancomycin  Diet: Regular Fluids: Normal saline at 100 cc per hour DVT Prophylaxis: SCDs  Code Status: Presumed full code per previous admissions  Family Communication: None  Disposition Plan: Monitor in step down overnight and if stable and hemoglobin improves with transfusions consider transfer to the floor tomorrow  Time spent: 70  This note has been created with Surveyor, quantity. Any transcriptional errors are unintentional.   Kwinton Maahs M. Cruzita Lederer, MD [facility] Pager [phone]  If 7PM-7AM, please contact night-coverage [email or web address] Password [email or web address] 04/19/2014, 9:59 PM

## 2014-04-19 NOTE — ED Notes (Signed)
CRITICAL VALUE ALERT  Critical value received:  Hemoglobin 6.5  Date of notification:  04/19/14  Time of notification:  2106  Critical value read back:Yes.    Nurse who received alert:  Derek Mound RN  MD notified (1st page):  [place]  Time of first page:  2107  MD notified (2nd page):  Time of second page:  Responding MD:  Mcmanus  Time MD responded:  2107

## 2014-04-19 NOTE — ED Provider Notes (Signed)
CSN: [id number]     Arrival date & time 04/19/14  1625 History   First MD Initiated Contact with Patient 04/19/14 2027     Chief Complaint  Patient presents with  . Weakness     HPI Pt was seen at 2035. Per pt, c/o gradual onset and persistence of multiple intermittent episodes of N/V/D that began last week. Describes the stools as "watery." Pt was evaluated by her PMD for same, dx cdiff on Tuesday, and started on abx. Pt states she is "hardly urinating," and is unable to tol PO due to nausea. States she has "had so much diarrhea I've lost count." Has been associated with generalized weakness/fatigue. Pt called her PMD today and was told to come to the ED for further evaluation and admission for probable dehydration. Denies abd pain, no CP/SOB, no back pain, no fevers, no black or blood in stools or emesis.    Past Medical History  Diagnosis Date  . GERD (gastroesophageal reflux disease)   . 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 c/w steatohepatitis  . Folate deficiency 09/2013  . Anasarca 10/10/2013  . Hematemesis/vomiting blood 02/24/2014  . Acute blood loss anemia 02/25/2014    Status post transfusion  . Macrocytosis 02/28/2014  . 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 Acute renal failure 09/2013    Pre-renal- resolved   Past Surgical History  Procedure Laterality Date  . None    . Paracentesis  Feb 2015    1180 fluid, negative fluid analysis.   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 Paracentesis  10/2013  . Colonoscopy N/A 12/19/2013    SLF:NO OBVIOUS SOURCE FOR ANEMIA IDETIFIED/ONE COLON POLYP REMOVED/Small internal hemorrhoids  . Esophagogastroduodenoscopy N/A 02/11/2014    WUX:[id number] varices with bleeding stigmata-status post esophageal band ligation therapy. Portal gastropathy    Family History  Problem Relation Age of Onset  . Heart disease Mother   . Colon cancer Neg Hx   . Liver disease Neg Hx    History  Substance Use Topics  . Smoking status: Former Smoker -- 0.25 packs/day for 20 years    Types: Cigarettes    Quit date: 11/05/2008  . Smokeless tobacco: Never Used  . Alcohol Use: No   OB History   Grav Para Term Preterm Abortions TAB SAB Ect Mult Living   1 1 1       1      Review of Systems ROS: Statement: All systems negative except as marked or noted in the HPI; Constitutional: Negative for fever and chills. +generalized weakness/fatigue. ; ; Eyes: Negative for eye pain, redness and discharge. ; ; ENMT: Negative for ear pain, hoarseness, nasal congestion, sinus pressure and sore throat. ; ; Cardiovascular: Negative for chest pain, palpitations, diaphoresis, dyspnea and peripheral edema. ; ; Respiratory: Negative for cough, wheezing and stridor. ; ; Gastrointestinal: +N/V/D. Negative for abdominal pain, blood in stool, hematemesis, jaundice and rectal bleeding. . ; ; Genitourinary: +decreased urination. Negative for dysuria, flank pain and hematuria. ; ; Musculoskeletal: Negative for back pain and neck pain. Negative for swelling and trauma.; ; Skin: Negative for pruritus, rash, abrasions, blisters, bruising and skin lesion.; ; Neuro: Negative for headache, lightheadedness and neck stiffness. Negative for altered level of consciousness , altered mental status, extremity weakness, paresthesias, involuntary movement, seizure and syncope.  Allergies  Lasix  Home Medications   Prior to Admission medications   Medication Sig Start Date End Date Taking? Authorizing Provider  ondansetron (ZOFRAN) 8 MG tablet Take 1 tablet (8 mg total) by mouth every 4 (four) hours as needed. 04/17/14  Yes [name], MD  oxyCODONE (ROXICODONE) 5 MG immediate release tablet Take 1 tablet (5 mg total) by mouth every 6 (six) hours as needed for severe pain. 04/12/14  Yes  Mahala Menghini, PA-C  pantoprazole (PROTONIX) 40 MG tablet Take 1 tablet (40 mg total) by mouth 2 (two) times daily before a meal. For management of GI bleeding. 03/02/14  Yes Rexene Alberts, MD  potassium chloride (MICRO-K) 10 MEQ CR capsule Take 10 mEq by mouth 2 (two) times daily.   Yes Historical Provider, MD  spironolactone (ALDACTONE) 25 MG tablet Take 1 tablet (25 mg total) by mouth daily. For treatment of swelling 03/02/14  Yes Rexene Alberts, MD  torsemide (DEMADEX) 20 MG tablet Take 2 tablets (40 mg total) by mouth daily. Take for treatment of swelling. 03/03/14  Yes Rexene Alberts, MD  vancomycin (VANCOCIN) 125 MG capsule Take 1 capsule (125 mg total) by mouth 3 (three) times daily. 04/17/14  Yes [name], MD   BP 105/63  Pulse 117  Temp(Src) 98 F (36.7 C) (Oral)  Resp 18  Ht 5\' 2"  (1.575 m)  Wt 140 lb (63.504 kg)  BMI 25.60 kg/m2  SpO2 99%  LMP 09/19/2013 Physical Exam 2040: Physical examination:  Nursing notes reviewed; Vital signs and O2 SAT reviewed;  Constitutional: Well developed, Well nourished, In no acute distress; Head:  Normocephalic, atraumatic; Eyes: EOMI, PERRL, +scleral icterus; ENMT: Mouth and pharynx normal, Mucous membranes cracked and dry;; Neck: Supple, Full range of motion, No lymphadenopathy; Cardiovascular: Tachycardic rate and rhythm, No gallop; Respiratory: Breath sounds clear & equal bilaterally, No rales, rhonchi, wheezes.  Speaking full sentences with ease, Normal respiratory effort/excursion; Chest: Nontender, Movement normal; Abdomen: Soft, Nontender, Nondistended, Normal bowel sounds; Genitourinary: No CVA tenderness; Extremities: Pulses normal, No tenderness, No edema, No calf edema or asymmetry.; Neuro: AA&Ox3, Major CN grossly intact.  Speech clear. No gross focal motor or sensory deficits in extremities.; Skin: Color jaundiced, Warm, Dry.   ED Course  Procedures     MDM  MDM Reviewed: previous chart, vitals and nursing note Reviewed previous:  labs Interpretation: labs Total time providing critical care: 30-74 minutes. This excludes time spent performing separately reportable procedures and services. Consults: admitting MD    CRITICAL CARE Performed by: Alfonzo Feller Total critical care time: 35 Critical care time was exclusive of separately billable procedures and treating other patients. Critical care was necessary to treat or prevent imminent or life-threatening deterioration. Critical care was time spent personally by me on the following activities: development of treatment plan with patient and/or surrogate as well as nursing, discussions with consultants, evaluation of patient's response to treatment, examination of patient, obtaining history from patient or surrogate, ordering and performing treatments and interventions, ordering and review of laboratory studies, ordering and review of radiographic studies, pulse oximetry and re-evaluation of patient's condition.   Results for orders placed during the hospital encounter of 04/19/14  CBC WITH DIFFERENTIAL      Result Value Ref Range   WBC 14.3 (*) 4.0 - 10.5 K/uL   RBC 1.91 (*) 3.87 - 5.11 MIL/uL   Hemoglobin 6.5 (*) 12.0 - 15.0 g/dL   HCT 18.7 (*) 36.0 - 46.0 %   MCV 97.9  78.0 -  100.0 fL   MCH 34.0  26.0 - 34.0 pg   MCHC 34.8  30.0 - 36.0 g/dL   RDW 23.0 (*) 11.5 - 15.5 %   Platelets 254  150 - 400 K/uL   Neutrophils Relative % 74  43 - 77 %   Lymphocytes Relative 14  12 - 46 %   Monocytes Relative 10  3 - 12 %   Eosinophils Relative 1  0 - 5 %   Basophils Relative 1  0 - 1 %   Neutro Abs 10.7 (*) 1.7 - 7.7 K/uL   Lymphs Abs 2.0  0.7 - 4.0 K/uL   Monocytes Absolute 1.4 (*) 0.1 - 1.0 K/uL   Eosinophils Absolute 0.1  0.0 - 0.7 K/uL   Basophils Absolute 0.1  0.0 - 0.1 K/uL   RBC Morphology RARE NRBCs     WBC Morphology TOXIC GRANULATION    PROTIME-INR      Result Value Ref Range   Prothrombin Time 18.6 (*) 11.6 - 15.2 seconds   INR 1.55 (*) 0.00 - 1.49   COMPREHENSIVE METABOLIC PANEL      Result Value Ref Range   Sodium 134 (*) 137 - 147 mEq/L   Potassium 2.9 (*) 3.7 - 5.3 mEq/L   Chloride 93 (*) 96 - 112 mEq/L   CO2 22  19 - 32 mEq/L   Glucose, Bld 132 (*) 70 - 99 mg/dL   BUN 44 (*) 6 - 23 mg/dL   Creatinine, Ser 4.13 (*) 0.50 - 1.10 mg/dL   Calcium 7.5 (*) 8.4 - 10.5 mg/dL   Total Protein 7.9  6.0 - 8.3 g/dL   Albumin 2.2 (*) 3.5 - 5.2 g/dL   AST 128 (*) 0 - 37 U/L   ALT 19  0 - 35 U/L   Alkaline Phosphatase 185 (*) 39 - 117 U/L   Total Bilirubin 8.6 (*) 0.3 - 1.2 mg/dL   GFR calc non Af Amer 13 (*) >90 mL/min   GFR calc Af Amer 15 (*) >90 mL/min   Anion gap 19 (*) 5 - 15  AMMONIA      Result Value Ref Range   Ammonia 69 (*) 11 - 60 umol/L  LIPASE, BLOOD      Result Value Ref Range   Lipase 79 (*) 11 - 59 U/L  LACTIC ACID, PLASMA      Result Value Ref Range   Lactic Acid, Venous 2.2  0.5 - 2.2 mmol/L    Results for Wanda [name], Wanda [name] (MRN [id number]) as of 04/19/2014 22:05  Ref. Range 03/02/2014 05:34 04/12/2014 11:36 04/17/2014 11:10 04/19/2014 20:49  BUN Latest Range: 6-23 mg/dL 21 27 (H) 42 (H) 44 (H)  Creatinine Latest Range: 0.50-1.10 mg/dL 1.19 (H) 2.15 (H) 2.90 (H) 4.13 (H)    2140:   ARF; will continue IVF. H/H lower than previous; will start PRBC transfusion. Pt has not stooled while in the ED. Potassium repleted PO. Pt is tachycardic with SBP 90-100's which is c/w her baseline per EPIC chart review. Afebrile while in the ED. Dx and testing d/w pt and family.  Questions answered.  Verb understanding, agreeable to admit.  T/C to [facility] Dr. Cruzita Lederer, case discussed, including:  HPI, pertinent PM/SHx, VS/PE, dx testing, ED course and treatment:  Agreeable to admit, requests to write temporary orders, obtain stepdown bed.   Francine Graven, DO 04/20/14 [phone]

## 2014-04-19 NOTE — Telephone Encounter (Signed)
Sounds like hydration continues to be significant problem. I would suggest she go back to ER and request admission for dehydration, C.Diff. She has very little reserves given renal/liver failure.

## 2014-04-19 NOTE — Telephone Encounter (Signed)
Tried to call. Many rings and no answer.  

## 2014-04-19 NOTE — ED Notes (Signed)
Pt says she is weak, recently here with C diff.  Pt is jaundiced Having diarrhea, nausea, vomiting No fever or chills

## 2014-04-20 DIAGNOSIS — K7460 Unspecified cirrhosis of liver: Secondary | ICD-10-CM

## 2014-04-20 DIAGNOSIS — R17 Unspecified jaundice: Secondary | ICD-10-CM

## 2014-04-20 DIAGNOSIS — D649 Anemia, unspecified: Secondary | ICD-10-CM

## 2014-04-20 DIAGNOSIS — A0472 Enterocolitis due to Clostridium difficile, not specified as recurrent: Secondary | ICD-10-CM

## 2014-04-20 LAB — COMPREHENSIVE METABOLIC PANEL
ALT: 17 U/L (ref 0–35)
AST: 121 U/L — ABNORMAL HIGH (ref 0–37)
Albumin: 1.9 g/dL — ABNORMAL LOW (ref 3.5–5.2)
Alkaline Phosphatase: 160 U/L — ABNORMAL HIGH (ref 39–117)
Anion gap: 15 (ref 5–15)
BILIRUBIN TOTAL: 8.3 mg/dL — AB (ref 0.3–1.2)
BUN: 41 mg/dL — ABNORMAL HIGH (ref 6–23)
CALCIUM: 7 mg/dL — AB (ref 8.4–10.5)
CO2: 19 mEq/L (ref 19–32)
Chloride: 100 mEq/L (ref 96–112)
Creatinine, Ser: 3.61 mg/dL — ABNORMAL HIGH (ref 0.50–1.10)
GFR calc Af Amer: 17 mL/min — ABNORMAL LOW (ref >90)
GFR, EST NON AFRICAN AMERICAN: 15 mL/min — AB (ref >90)
GLUCOSE: 126 mg/dL — AB (ref 70–99)
Potassium: 3.9 mEq/L (ref 3.7–5.3)
Sodium: 134 mEq/L — ABNORMAL LOW (ref 137–147)
Total Protein: 7.2 g/dL (ref 6.0–8.3)

## 2014-04-20 LAB — PROTIME-INR
INR: 1.67 — ABNORMAL HIGH (ref 0.00–1.49)
PROTHROMBIN TIME: 19.7 s — AB (ref 11.6–15.2)

## 2014-04-20 LAB — CBC
HCT: 24.1 % — ABNORMAL LOW (ref 36.0–46.0)
Hemoglobin: 8.4 g/dL — ABNORMAL LOW (ref 12.0–15.0)
MCH: 31.9 pg (ref 26.0–34.0)
MCHC: 34.9 g/dL (ref 30.0–36.0)
MCV: 91.6 fL (ref 78.0–100.0)
PLATELETS: 201 10*3/uL (ref 150–400)
RBC: 2.63 MIL/uL — AB (ref 3.87–5.11)
RDW: 22.1 % — AB (ref 11.5–15.5)
WBC: 10.2 10*3/uL (ref 4.0–10.5)

## 2014-04-20 LAB — MRSA PCR SCREENING: MRSA BY PCR: NEGATIVE

## 2014-04-20 LAB — URINE CULTURE

## 2014-04-20 LAB — PHOSPHORUS: Phosphorus: 1.3 mg/dL — ABNORMAL LOW (ref 2.3–4.6)

## 2014-04-20 LAB — MAGNESIUM: Magnesium: 1 mg/dL — ABNORMAL LOW (ref 1.5–2.5)

## 2014-04-20 MED ORDER — K PHOS MONO-SOD PHOS DI & MONO 155-852-130 MG PO TABS
250.0000 mg | ORAL_TABLET | Freq: Two times a day (BID) | ORAL | Status: DC
  Administered 2014-04-20 – 2014-04-23 (×7): 250 mg via ORAL
  Filled 2014-04-20 (×7): qty 1

## 2014-04-20 MED ORDER — DEXTROSE 5 % IV SOLN
INTRAVENOUS | Status: AC
  Filled 2014-04-20: qty 10

## 2014-04-20 MED ORDER — LORAZEPAM 0.5 MG PO TABS
0.5000 mg | ORAL_TABLET | Freq: Once | ORAL | Status: AC
  Administered 2014-04-20: 0.5 mg via ORAL
  Filled 2014-04-20: qty 1

## 2014-04-20 MED ORDER — SODIUM BICARBONATE 650 MG PO TABS
650.0000 mg | ORAL_TABLET | Freq: Two times a day (BID) | ORAL | Status: DC
  Administered 2014-04-20 – 2014-04-21 (×3): 650 mg via ORAL
  Filled 2014-04-20 (×3): qty 1

## 2014-04-20 MED ORDER — MAGNESIUM SULFATE 4000MG/100ML IJ SOLN
4.0000 g | Freq: Once | INTRAMUSCULAR | Status: AC
  Administered 2014-04-20: 4 g via INTRAVENOUS
  Filled 2014-04-20: qty 100

## 2014-04-20 NOTE — Telephone Encounter (Signed)
Pt is inpatient at the hospital.

## 2014-04-20 NOTE — Telephone Encounter (Signed)
Noted  

## 2014-04-20 NOTE — Progress Notes (Signed)
[facility] PROGRESS NOTE  Wanda [name] FBP:[id number] DOB: 07-15-1976 DOA: 04/19/2014 PCP: Default, Provider, MD  Assessment/Plan: 1. Clostridium difficile colitis. Patient started on oral vancomycin 9/10. She's continued diarrhea. Continue current treatments. Include probiotics. 2. Acute renal failure. Possibly related to volume depletion and dehydration. [facility] request nephrology consultation. Continue with IV fluids. 3. Hypotension. Likely related to volume depletion and underlying cirrhosis. Patient appears to be mentating appropriately. We'll continue to follow with IV hydration. 4. 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 Followed by GI as an outpatient. 5. Anemia, likely related to chronic disease as well as acute illness. Transfuse 2 units PRBCs since admission. Stool occult blood is pending. 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 Continue to follow 6. Possible urinary tract infection. Started on Rocephin. Discontinue if urine culture does not show any growth  Code Status: full code Family Communication: discussed with patient Disposition Plan: discharge home once improved   Consultants:  Nephrology  Procedures:    Antibiotics:  Vancomycin 9/10  Rocephin 9/10  HPI/Subjective: Complains of pain in her legs and all over her body.  Has continued diarrhea, no vomiting.  Objective: Filed Vitals:   04/20/14 0900  BP: 85/49  Pulse:   Temp:   Resp:     Intake/Output Summary (Last 24 hours) at 04/20/14 1105 Last data filed at 04/20/14 0900  Gross per 24 hour  Intake 1749.16 ml  Output    400 ml  Net 1349.16 ml   Filed Weights   04/19/14 1627 04/19/14 1635 04/19/14 2315  Weight: 63.504 kg (140 lb) 63.504 kg (140 lb) 55.7 kg (122 lb 12.7 oz)    Exam:   General:  NAD, jaundiced  Cardiovascular: s1, s2, rrr  Respiratory: cta b  Abdomen: soft, mildly distended, non tender  Musculoskeletal: 1+ edema b/l   Data Reviewed: Basic Metabolic  Panel:  Recent Labs Lab 04/17/14 1110 04/19/14 2049 04/20/14 0710  NA 135* 134* 134*  K 3.0* 2.9* 3.9  CL 92* 93* 100  CO2 25 22 19   GLUCOSE 124* 132* 126*  BUN 42* 44* 41*  CREATININE 2.90* 4.13* 3.61*  CALCIUM 8.4 7.5* 7.0*  MG  --   --  1.0*  PHOS  --   --  1.3*   Liver Function Tests:  Recent Labs Lab 04/17/14 1110 04/19/14 2049 04/20/14 0710  AST 130* 128* 121*  ALT 17 19 17   ALKPHOS 196* 185* 160*  BILITOT 10.9* 8.6* 8.3*  PROT 8.5* 7.9 7.2  ALBUMIN 2.5* 2.2* 1.9*    Recent Labs Lab 04/19/14 2049  LIPASE 79*    Recent Labs Lab 04/17/14 1143 04/19/14 2049  AMMONIA 60 69*   CBC:  Recent Labs Lab 04/17/14 1110 04/19/14 2049 04/20/14 0710  WBC 12.5* 14.3* 10.2  NEUTROABS 9.7* 10.7*  --   HGB 7.5* 6.5* 8.4*  HCT 21.8* 18.7* 24.1*  MCV 97.3 97.9 91.6  PLT 262 254 201   Cardiac Enzymes: No results found for this basename: CKTOTAL, CKMB, CKMBINDEX, TROPONINI,  in the last 168 hours BNP (last 3 results) No results found for this basename: PROBNP,  in the last 8760 hours CBG: No results found for this basename: GLUCAP,  in the last 168 hours  Recent Results (from the past 240 hour(s))  CLOSTRIDIUM DIFFICILE BY PCR     Status: Abnormal   Collection Time    04/12/14  9:40 PM      Result Value Ref Range Status   C difficile by pcr Detected (*) Not Detected  Final   Comment: This test is for use only with liquid or soft stools;     performance characteristics of other clinical specimen     types have not been established.           This assay was performed by Cepheid GeneXpert(R) PCR.     The performance characteristics of this assay have     been determined by [facility]. Performance     characteristics refer to the analytical performance     of the test.  ANAEROBIC CULTURE     Status: None   Collection Time    04/13/14  3:25 PM      Result Value Ref Range Status   Specimen Description ASCITIC FLUID   Final   Special Requests  NONE   Final   Gram Stain     Final   Value: FEW WBC PRESENT, PREDOMINANTLY MONONUCLEAR     NO ORGANISMS SEEN     Performed at [facility]   Culture     Final   Value: NO ANAEROBES ISOLATED     Performed at [facility]   Report Status 04/19/2014 FINAL   Final  BODY FLUID CULTURE     Status: None   Collection Time    04/13/14  3:25 PM      Result Value Ref Range Status   Specimen Description PERITONEAL FLUID   Final   Special Requests NONE   Final   Gram Stain     Final   Value: FEW WBC PRESENT, PREDOMINANTLY MONONUCLEAR     NO ORGANISMS SEEN     Performed at [facility]   Culture     Final   Value: NO GROWTH 3 DAYS     Performed at [facility]   Report Status 04/17/2014 FINAL   Final  URINE CULTURE     Status: None   Collection Time    04/17/14 12:12 PM      Result Value Ref Range Status   Specimen Description URINE, CLEAN CATCH   Final   Special Requests IMMUNE:COMPROMISED   Final   Culture  Setup Time     Final   Value: 04/17/2014 18:58     Performed at [place]     Final   Value: 65,000 COLONIES/ML     Performed at [facility]   Culture     Final   Value: ENTEROCOCCUS SPECIES     Performed at [facility]   Report Status 04/20/2014 FINAL   Final   Organism ID, Bacteria ENTEROCOCCUS SPECIES   Final  MRSA PCR SCREENING     Status: None   Collection Time    04/20/14  6:23 AM      Result Value Ref Range Status   MRSA by PCR NEGATIVE  NEGATIVE Final   Comment:            The GeneXpert MRSA Assay (FDA     approved for NASAL specimens     only), is one component of a     comprehensive MRSA colonization     surveillance program. It is not     intended to diagnose MRSA     infection nor to guide or     monitor treatment for     MRSA infections.     Studies: Dg Abd Acute W/chest  04/19/2014   CLINICAL DATA:  Dehydration and weakness for a week. Fluid  in the abdomen.  EXAM: ACUTE ABDOMEN  SERIES (ABDOMEN 2 VIEW & CHEST 1 VIEW)  COMPARISON:  Chest 02/25/2014.  Abdomen series 718 2015.  FINDINGS: Shallow inspiration. Atelectasis in the lung bases. Normal heart size and pulmonary vascularity. No focal airspace disease or consolidation in the lungs. No blunting of costophrenic angles. No pneumothorax. Mediastinal contours appear intact.  Abdominal distention with diffuse underpenetration consistent with ascites. Scattered gas and stool in the colon. No small or large bowel distention. No free intra-abdominal air. No abnormal air-fluid levels noted.  IMPRESSION: Shallow inspiration with atelectasis in the lung bases. Nonobstructive bowel gas pattern. Abdominal distention with diffuse underpenetration suggesting ascites.   Electronically Signed   By: Lucienne Capers M.D.   On: 04/19/2014 23:22    Scheduled Meds: . cefTRIAXone (ROCEPHIN)  IV  1 g Intravenous Q24H  . pantoprazole (PROTONIX) IV  40 mg Intravenous Q12H  . sodium chloride  3 mL Intravenous Q12H  . vancomycin  125 mg Oral 4 times per day   Continuous Infusions: . sodium chloride 100 mL/hr at 04/20/14 0900    Active Problems:   Colitis   Ascites   Acute renal failure   Sinus tachycardia   Anemia   Hypokalemia   C. difficile diarrhea    Time spent: 42mins    MEMON,JEHANZEB  [facility] Pager [phone]. If 7PM-7AM, please contact night-coverage at [email or web address], password [facility] 04/20/2014, 11:05 AM  LOS: 1 day

## 2014-04-20 NOTE — Consult Note (Signed)
Reason for Consult: Acute kidney injury superimposed on chronic Referring Physician: Dr. Beather Arbour Wanda [name] is an 38 y.o. female.  HPI: She is a patient who has history of liver cirrhosis, chronic renal failure, Kappa and lambda chain proteinuria . Presently came with complaints of diarrhea for about 1 week. Patient states that she was having diarrhea and went to GI clinic where  they check her stool and was told that she has C.difficile and was put on antibiotics. Presently came to the hospital because she continues to have diarrhea and also she  was very late in getting her antibiotics. Presently patient denies any nausea vomiting. She says that she was feeling dizzy when she came but today seems to be feeling better. Consult is called because of woresning of her renal failure  Past Medical History  Diagnosis Date  . GERD (gastroesophageal reflux disease)   . 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 c/w steatohepatitis  . Folate deficiency 09/2013  . Anasarca 10/10/2013  . Hematemesis/vomiting blood 02/24/2014  . Acute blood loss anemia 02/25/2014    Status post transfusion  . Macrocytosis 02/28/2014  . 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 Acute renal failure 09/2013    Pre-renal- resolved    Past Surgical History  Procedure Laterality Date  . None    . Paracentesis  Feb 2015    1180 fluid, negative fluid analysis.   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 Paracentesis  10/2013  . Colonoscopy N/A 12/19/2013    SLF:NO OBVIOUS SOURCE FOR ANEMIA IDETIFIED/ONE COLON POLYP REMOVED/Small internal hemorrhoids  . Esophagogastroduodenoscopy N/A 02/11/2014    JWJ:[id number] varices with bleeding stigmata-status post esophageal band ligation therapy. Portal gastropathy    Family History  Problem  Relation Age of Onset  . Heart disease Mother   . Colon cancer Neg Hx   . Liver disease Neg Hx     Social History:  reports that she quit smoking about 5 years ago. Her smoking use included Cigarettes. She has a 5 pack-year smoking history. She has never used smokeless tobacco. She reports that she does not drink alcohol or use illicit drugs.  Allergies:  Allergies  Allergen Reactions  . Lasix [Furosemide]     "doesn't work"    Medications: I have reviewed the patient's current medications.  Results for orders placed during the hospital encounter of 04/19/14 (from the past 48 hour(s))  CBC WITH DIFFERENTIAL     Status: Abnormal   Collection Time    04/19/14  8:49 PM      Result Value Ref Range   WBC 14.3 (*) 4.0 - 10.5 K/uL   RBC 1.91 (*) 3.87 - 5.11 MIL/uL   Hemoglobin 6.5 (*) 12.0 - 15.0 g/dL   Comment: RESULT REPEATED AND VERIFIED     CRITICAL RESULT CALLED TO, READ BACK BY AND VERIFIED WITH:     HAYMORE R AT 2106 ON [id number] BY FORSYTH K   HCT 18.7 (*) 36.0 - 46.0 %   MCV 97.9  78.0 - 100.0 fL   MCH 34.0  26.0 - 34.0 pg   MCHC 34.8  30.0 - 36.0 g/dL   RDW 23.0 (*) 11.5 - 15.5 %   Platelets 254  150 - 400 K/uL   Neutrophils Relative % 74  43 - 77 %  Lymphocytes Relative 14  12 - 46 %   Monocytes Relative 10  3 - 12 %   Eosinophils Relative 1  0 - 5 %   Basophils Relative 1  0 - 1 %   Neutro Abs 10.7 (*) 1.7 - 7.7 K/uL   Lymphs Abs 2.0  0.7 - 4.0 K/uL   Monocytes Absolute 1.4 (*) 0.1 - 1.0 K/uL   Eosinophils Absolute 0.1  0.0 - 0.7 K/uL   Basophils Absolute 0.1  0.0 - 0.1 K/uL   RBC Morphology RARE NRBCs     Comment: TARGET CELLS     BASOPHILIC STIPPLING     ROULEAUX   WBC Morphology TOXIC GRANULATION     Comment: VACUOLATED NEUTROPHILS  PROTIME-INR     Status: Abnormal   Collection Time    04/19/14  8:49 PM      Result Value Ref Range   Prothrombin Time 18.6 (*) 11.6 - 15.2 seconds   INR 1.55 (*) 0.00 - 1.49  COMPREHENSIVE METABOLIC PANEL     Status: Abnormal    Collection Time    04/19/14  8:49 PM      Result Value Ref Range   Sodium 134 (*) 137 - 147 mEq/L   Potassium 2.9 (*) 3.7 - 5.3 mEq/L   Comment: CRITICAL RESULT CALLED TO, READ BACK BY AND VERIFIED WITH:     WALL,Y ON 04/19/14 AT 2125 BY LOY,C   Chloride 93 (*) 96 - 112 mEq/L   CO2 22  19 - 32 mEq/L   Glucose, Bld 132 (*) 70 - 99 mg/dL   BUN 44 (*) 6 - 23 mg/dL   Creatinine, Ser 4.13 (*) 0.50 - 1.10 mg/dL   Calcium 7.5 (*) 8.4 - 10.5 mg/dL   Total Protein 7.9  6.0 - 8.3 g/dL   Albumin 2.2 (*) 3.5 - 5.2 g/dL   AST 128 (*) 0 - 37 U/L   ALT 19  0 - 35 U/L   Alkaline Phosphatase 185 (*) 39 - 117 U/L   Total Bilirubin 8.6 (*) 0.3 - 1.2 mg/dL   GFR calc non Af Amer 13 (*) >90 mL/min   GFR calc Af Amer 15 (*) >90 mL/min   Comment: (NOTE)     The eGFR has been calculated using the CKD EPI equation.     This calculation has not been validated in all clinical situations.     eGFR's persistently <90 mL/min signify possible Chronic Kidney     Disease.   Anion gap 19 (*) 5 - 15  AMMONIA     Status: Abnormal   Collection Time    04/19/14  8:49 PM      Result Value Ref Range   Ammonia 69 (*) 11 - 60 umol/L  LIPASE, BLOOD     Status: Abnormal   Collection Time    04/19/14  8:49 PM      Result Value Ref Range   Lipase 79 (*) 11 - 59 U/L  LACTIC ACID, PLASMA     Status: None   Collection Time    04/19/14  8:49 PM      Result Value Ref Range   Lactic Acid, Venous 2.2  0.5 - 2.2 mmol/L  TYPE AND SCREEN     Status: None   Collection Time    04/19/14  9:40 PM      Result Value Ref Range   ABO/RH(D) A POS     Antibody Screen NEG     Sample Expiration  04/22/2014     Unit Number [id number]     Blood Component Type RED CELLS,LR     Unit division 00     Status of Unit ISSUED     Transfusion Status OK TO TRANSFUSE     Crossmatch Result Compatible     Unit Number [id number]     Blood Component Type RED CELLS,LR     Unit division 00     Status of Unit ISSUED     Transfusion  Status OK TO TRANSFUSE     Crossmatch Result Compatible    PREPARE RBC (CROSSMATCH)     Status: None   Collection Time    04/19/14  9:40 PM      Result Value Ref Range   Order Confirmation ORDER PROCESSED BY BLOOD BANK    URINALYSIS, ROUTINE W REFLEX MICROSCOPIC     Status: Abnormal   Collection Time    04/19/14  9:58 PM      Result Value Ref Range   Color, Urine AMBER (*) YELLOW   Comment: BIOCHEMICALS MAY BE AFFECTED BY COLOR   APPearance CLEAR  CLEAR   Specific Gravity, Urine 1.015  1.005 - 1.030   pH 5.0  5.0 - 8.0   Glucose, UA 100 (*) NEGATIVE mg/dL   Hgb urine dipstick NEGATIVE  NEGATIVE   Bilirubin Urine LARGE (*) NEGATIVE   Ketones, ur TRACE (*) NEGATIVE mg/dL   Protein, ur 30 (*) NEGATIVE mg/dL   Urobilinogen, UA 2.0 (*) 0.0 - 1.0 mg/dL   Nitrite POSITIVE (*) NEGATIVE   Leukocytes, UA NEGATIVE  NEGATIVE  PREGNANCY, URINE     Status: None   Collection Time    04/19/14  9:58 PM      Result Value Ref Range   Preg Test, Ur NEGATIVE  NEGATIVE   Comment:            THE SENSITIVITY OF THIS     METHODOLOGY IS >20 mIU/mL.  URINE MICROSCOPIC-ADD ON     Status: Abnormal   Collection Time    04/19/14  9:58 PM      Result Value Ref Range   Squamous Epithelial / LPF RARE  RARE   WBC, UA 0-2  <3 WBC/hpf   Bacteria, UA FEW (*) RARE  MRSA PCR SCREENING     Status: None   Collection Time    04/20/14  6:23 AM      Result Value Ref Range   MRSA by PCR NEGATIVE  NEGATIVE   Comment:            The GeneXpert MRSA Assay (FDA     approved for NASAL specimens     only), is one component of a     comprehensive MRSA colonization     surveillance program. It is not     intended to diagnose MRSA     infection nor to guide or     monitor treatment for     MRSA infections.  COMPREHENSIVE METABOLIC PANEL     Status: Abnormal   Collection Time    04/20/14  7:10 AM      Result Value Ref Range   Sodium 134 (*) 137 - 147 mEq/L   Potassium 3.9  3.7 - 5.3 mEq/L   Comment: DELTA CHECK  NOTED   Chloride 100  96 - 112 mEq/L   CO2 19  19 - 32 mEq/L   Glucose, Bld 126 (*) 70 - 99 mg/dL   BUN 41 (*) 6 -  23 mg/dL   Creatinine, Ser 3.61 (*) 0.50 - 1.10 mg/dL   Calcium 7.0 (*) 8.4 - 10.5 mg/dL   Total Protein 7.2  6.0 - 8.3 g/dL   Albumin 1.9 (*) 3.5 - 5.2 g/dL   AST 121 (*) 0 - 37 U/L   ALT 17  0 - 35 U/L   Alkaline Phosphatase 160 (*) 39 - 117 U/L   Total Bilirubin 8.3 (*) 0.3 - 1.2 mg/dL   GFR calc non Af Amer 15 (*) >90 mL/min   GFR calc Af Amer 17 (*) >90 mL/min   Comment: (NOTE)     The eGFR has been calculated using the CKD EPI equation.     This calculation has not been validated in all clinical situations.     eGFR's persistently <90 mL/min signify possible Chronic Kidney     Disease.   Anion gap 15  5 - 15  CBC     Status: Abnormal   Collection Time    04/20/14  7:10 AM      Result Value Ref Range   WBC 10.2  4.0 - 10.5 K/uL   RBC 2.63 (*) 3.87 - 5.11 MIL/uL   Hemoglobin 8.4 (*) 12.0 - 15.0 g/dL   Comment: DELTA CHECK NOTED   HCT 24.1 (*) 36.0 - 46.0 %   MCV 91.6  78.0 - 100.0 fL   MCH 31.9  26.0 - 34.0 pg   MCHC 34.9  30.0 - 36.0 g/dL   RDW 22.1 (*) 11.5 - 15.5 %   Platelets 201  150 - 400 K/uL  PROTIME-INR     Status: Abnormal   Collection Time    04/20/14  7:10 AM      Result Value Ref Range   Prothrombin Time 19.7 (*) 11.6 - 15.2 seconds   INR 1.67 (*) 0.00 - 1.49  PHOSPHORUS     Status: Abnormal   Collection Time    04/20/14  7:10 AM      Result Value Ref Range   Phosphorus 1.3 (*) 2.3 - 4.6 mg/dL  MAGNESIUM     Status: Abnormal   Collection Time    04/20/14  7:10 AM      Result Value Ref Range   Magnesium 1.0 (*) 1.5 - 2.5 mg/dL    Dg Abd Acute W/chest  04/19/2014   CLINICAL DATA:  Dehydration and weakness for a week. Fluid in the abdomen.  EXAM: ACUTE ABDOMEN SERIES (ABDOMEN 2 VIEW & CHEST 1 VIEW)  COMPARISON:  Chest 02/25/2014.  Abdomen series 718 2015.  FINDINGS: Shallow inspiration. Atelectasis in the lung bases. Normal heart size  and pulmonary vascularity. No focal airspace disease or consolidation in the lungs. No blunting of costophrenic angles. No pneumothorax. Mediastinal contours appear intact.  Abdominal distention with diffuse underpenetration consistent with ascites. Scattered gas and stool in the colon. No small or large bowel distention. No free intra-abdominal air. No abnormal air-fluid levels noted.  IMPRESSION: Shallow inspiration with atelectasis in the lung bases. Nonobstructive bowel gas pattern. Abdominal distention with diffuse underpenetration suggesting ascites.   Electronically Signed   By: Lucienne Capers M.D.   On: 04/19/2014 23:22    Review of Systems  Constitutional: Positive for malaise/fatigue.  Respiratory: Negative for shortness of breath.   Cardiovascular: Negative for orthopnea and leg swelling.  Gastrointestinal: Positive for diarrhea. Negative for nausea and vomiting.  Neurological: Positive for weakness.   Blood pressure 85/49, pulse 95, temperature 98.5 F (36.9 C), temperature source Oral, resp.  rate 24, height 5' 2"  (1.575 m), weight 55.7 kg (122 lb 12.7 oz), last menstrual period 09/19/2013, SpO2 100.00%. Physical Exam  Constitutional: No distress.  Eyes: Left eye exhibits no discharge.  Neck: No JVD present.  Cardiovascular: Normal rate and regular rhythm.   Respiratory: She has no wheezes.  GI: She exhibits distension. There is no tenderness. There is no rebound.  Musculoskeletal: She exhibits no edema.    Assessment/Plan: Problem #1 acute kidney injury superimposed on chronic. Presently her BUN and creatinine has increased significantly. Possibly from dehydration associated with her diuretics and also C. difficile colitis. Problem #2 history of liver cirrhosis Problem #3 hypokalemia Problem #4 anasarca: Status post paracentesis. Presently patient doesn't seem to have any edema. Problem #5 history of proteinuria Problem #6 anemia: Patient with recent history of GI  bleeding. Problem #7 bipolar disorder Plan: Agree with discontinuation of diuretics and slowly dehydration. We'll check her basic metabolic panel and phosphorus in the morning and we'll follow patient.  Hameed Kolar S 04/20/2014, 11:04 AM

## 2014-04-20 NOTE — Plan of Care (Signed)
Problem: Consults Goal: General Medical Patient Education See Patient Education Module for specific education. Outcome: Progressing Pt admitted for complaint of weakness/dehydration &diarhea. Pt to receive 2 prbc's for low hgb  & antibiotics Rx  Problem: Phase I Progression Outcomes Goal: Pain controlled with appropriate interventions Outcome: Progressing Pt c/o BLE &hands with pain. Pt given RX'd pain med oxycodone 5mg 

## 2014-04-21 DIAGNOSIS — R609 Edema, unspecified: Secondary | ICD-10-CM

## 2014-04-21 DIAGNOSIS — A419 Sepsis, unspecified organism: Secondary | ICD-10-CM

## 2014-04-21 DIAGNOSIS — R188 Other ascites: Secondary | ICD-10-CM

## 2014-04-21 LAB — TYPE AND SCREEN
ABO/RH(D): A POS
Antibody Screen: NEGATIVE
UNIT DIVISION: 0
Unit division: 0

## 2014-04-21 LAB — URINE CULTURE
Colony Count: NO GROWTH
Culture: NO GROWTH

## 2014-04-21 LAB — BASIC METABOLIC PANEL
Anion gap: 17 — ABNORMAL HIGH (ref 5–15)
BUN: 35 mg/dL — ABNORMAL HIGH (ref 6–23)
CHLORIDE: 98 meq/L (ref 96–112)
CO2: 17 meq/L — AB (ref 19–32)
CREATININE: 3.03 mg/dL — AB (ref 0.50–1.10)
Calcium: 6.9 mg/dL — ABNORMAL LOW (ref 8.4–10.5)
GFR calc Af Amer: 21 mL/min — ABNORMAL LOW (ref >90)
GFR calc non Af Amer: 18 mL/min — ABNORMAL LOW (ref >90)
Glucose, Bld: 112 mg/dL — ABNORMAL HIGH (ref 70–99)
Potassium: 3.7 mEq/L (ref 3.7–5.3)
Sodium: 132 mEq/L — ABNORMAL LOW (ref 137–147)

## 2014-04-21 LAB — CBC
HEMATOCRIT: 23.9 % — AB (ref 36.0–46.0)
Hemoglobin: 8.3 g/dL — ABNORMAL LOW (ref 12.0–15.0)
MCH: 32.4 pg (ref 26.0–34.0)
MCHC: 34.7 g/dL (ref 30.0–36.0)
MCV: 93.4 fL (ref 78.0–100.0)
Platelets: 202 10*3/uL (ref 150–400)
RBC: 2.56 MIL/uL — ABNORMAL LOW (ref 3.87–5.11)
RDW: 23.3 % — ABNORMAL HIGH (ref 11.5–15.5)
WBC: 10.8 10*3/uL — ABNORMAL HIGH (ref 4.0–10.5)

## 2014-04-21 LAB — PHOSPHORUS: Phosphorus: 2.3 mg/dL (ref 2.3–4.6)

## 2014-04-21 MED ORDER — SACCHAROMYCES BOULARDII 250 MG PO CAPS
250.0000 mg | ORAL_CAPSULE | Freq: Two times a day (BID) | ORAL | Status: DC
  Administered 2014-04-21 – 2014-04-27 (×13): 250 mg via ORAL
  Filled 2014-04-21 (×13): qty 1

## 2014-04-21 MED ORDER — SODIUM BICARBONATE 650 MG PO TABS
1300.0000 mg | ORAL_TABLET | Freq: Two times a day (BID) | ORAL | Status: DC
  Administered 2014-04-21 – 2014-04-24 (×7): 1300 mg via ORAL
  Filled 2014-04-21 (×7): qty 2

## 2014-04-21 MED ORDER — ZOLPIDEM TARTRATE 5 MG PO TABS
5.0000 mg | ORAL_TABLET | Freq: Once | ORAL | Status: AC
  Administered 2014-04-21: 5 mg via ORAL
  Filled 2014-04-21: qty 1

## 2014-04-21 MED ORDER — LORAZEPAM 0.5 MG PO TABS
0.5000 mg | ORAL_TABLET | Freq: Once | ORAL | Status: AC
  Administered 2014-04-21: 0.5 mg via ORAL
  Filled 2014-04-21: qty 1

## 2014-04-21 MED ORDER — ALBUMIN HUMAN 25 % IV SOLN
25.0000 g | Freq: Four times a day (QID) | INTRAVENOUS | Status: AC
  Administered 2014-04-21 – 2014-04-22 (×4): 25 g via INTRAVENOUS
  Filled 2014-04-21 (×4): qty 100

## 2014-04-21 NOTE — Progress Notes (Signed)
[facility] PROGRESS NOTE  Wanda [name] LZJ:[id number] DOB: [birth year] DOA: 04/19/2014 PCP: Default, Provider, MD  Assessment/Plan: 1. Clostridium difficile colitis. Patient started on oral vancomycin 9/10. She's continued diarrhea. Continue current treatments. Include probiotics. 2. Sepsis related to #1. Blood pressure is improved with IV hydration. Continue antibiotics. She's been afebrile. We'll follow cultures 3. Acute renal failure. Possibly related to volume depletion and dehydration. Appreciate nephrology input. Improving with IV hydration. 4. Hypotension. Likely related to volume depletion and underlying cirrhosis. Patient appears to be mentating appropriately. We'll continue to follow with IV hydration. 5. Metabolic acidosis, likely related to persistent diarrhea/sepsis. She's receiving IV fluids and has been started on oral bicarbonate replacement 6. 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 Followed by GI as an outpatient. Patient has worsening ascites related to hypoalbuminemia and fluid resuscitation. Will give albumin for today. Will likely need repeat paracentesis during this admission. 7. Anemia, likely related to chronic disease as well as acute illness. Transfuse 2 units PRBCs since admission. Stool occult blood is pending. 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 Continue to follow. Hemoglobin currently stable 8. Possible urinary tract infection. Urine culture shows no growth. We'll discontinue Rocephin  Code Status: full code Family Communication: discussed with patient Disposition Plan: discharge home once improved   Consultants:  Nephrology  Procedures:    Antibiotics:  Vancomycin 9/10>>  Rocephin 9/10>>9/12  HPI/Subjective: Feels that diarrhea is improving.  Feels that ascites and LE edema is getting worse  Objective: Filed Vitals:   04/21/14 1200  BP: 109/70  Pulse:   Temp:   Resp:     Intake/Output Summary (Last 24 hours) at 04/21/14  1241 Last data filed at 04/21/14 1105  Gross per 24 hour  Intake 2974.17 ml  Output    700 ml  Net 2274.17 ml   Filed Weights   04/19/14 1635 04/19/14 2315 04/21/14 0500  Weight: 63.504 kg (140 lb) 55.7 kg (122 lb 12.7 oz) 59.4 kg (130 lb 15.3 oz)    Exam:   General:  NAD, jaundiced  Cardiovascular: s1, s2, rrr  Respiratory: cta b  Abdomen: soft, distended, non tender  Musculoskeletal: 1+ edema b/l   Data Reviewed: Basic Metabolic Panel:  Recent Labs Lab 04/17/14 1110 04/19/14 2049 04/20/14 0710 04/21/14 0537  NA 135* 134* 134* 132*  K 3.0* 2.9* 3.9 3.7  CL 92* 93* 100 98  CO2 25 22 19  17*  GLUCOSE 124* 132* 126* 112*  BUN 42* 44* 41* 35*  CREATININE 2.90* 4.13* 3.61* 3.03*  CALCIUM 8.4 7.5* 7.0* 6.9*  MG  --   --  1.0*  --   PHOS  --   --  1.3* 2.3   Liver Function Tests:  Recent Labs Lab 04/17/14 1110 04/19/14 2049 04/20/14 0710  AST 130* 128* 121*  ALT 17 19 17   ALKPHOS 196* 185* 160*  BILITOT 10.9* 8.6* 8.3*  PROT 8.5* 7.9 7.2  ALBUMIN 2.5* 2.2* 1.9*    Recent Labs Lab 04/19/14 2049  LIPASE 79*    Recent Labs Lab 04/17/14 1143 04/19/14 2049  AMMONIA 60 69*   CBC:  Recent Labs Lab 04/17/14 1110 04/19/14 2049 04/20/14 0710 04/21/14 0537  WBC 12.5* 14.3* 10.2 10.8*  NEUTROABS 9.7* 10.7*  --   --   HGB 7.5* 6.5* 8.4* 8.3*  HCT 21.8* 18.7* 24.1* 23.9*  MCV 97.3 97.9 91.6 93.4  PLT 262 254 201 202   Cardiac Enzymes: No results found for this basename: CKTOTAL, CKMB, CKMBINDEX, TROPONINI,  in the  last 168 hours BNP (last 3 results) No results found for this basename: PROBNP,  in the last 8760 hours CBG: No results found for this basename: GLUCAP,  in the last 168 hours  Recent Results (from the past 240 hour(s))  CLOSTRIDIUM DIFFICILE BY PCR     Status: Abnormal   Collection Time    04/12/14  9:40 PM      Result Value Ref Range Status   C difficile by pcr Detected (*) Not Detected Final   Comment: This test is for use  only with liquid or soft stools;     performance characteristics of other clinical specimen     types have not been established.           This assay was performed by Cepheid GeneXpert(R) PCR.     The performance characteristics of this assay have     been determined by [facility]. Performance     characteristics refer to the analytical performance     of the test.  ANAEROBIC CULTURE     Status: None   Collection Time    04/13/14  3:25 PM      Result Value Ref Range Status   Specimen Description ASCITIC FLUID   Final   Special Requests NONE   Final   Gram Stain     Final   Value: FEW WBC PRESENT, PREDOMINANTLY MONONUCLEAR     NO ORGANISMS SEEN     Performed at [facility]   Culture     Final   Value: NO ANAEROBES ISOLATED     Performed at [facility]   Report Status 04/19/2014 FINAL   Final  BODY FLUID CULTURE     Status: None   Collection Time    04/13/14  3:25 PM      Result Value Ref Range Status   Specimen Description PERITONEAL FLUID   Final   Special Requests NONE   Final   Gram Stain     Final   Value: FEW WBC PRESENT, PREDOMINANTLY MONONUCLEAR     NO ORGANISMS SEEN     Performed at [facility]   Culture     Final   Value: NO GROWTH 3 DAYS     Performed at [facility]   Report Status 04/17/2014 FINAL   Final  URINE CULTURE     Status: None   Collection Time    04/17/14 12:12 PM      Result Value Ref Range Status   Specimen Description URINE, CLEAN CATCH   Final   Special Requests IMMUNE:COMPROMISED   Final   Culture  Setup Time     Final   Value: 04/17/2014 18:58     Performed at [place]     Final   Value: 65,000 COLONIES/ML     Performed at [facility]   Culture     Final   Value: ENTEROCOCCUS SPECIES     Performed at [facility]   Report Status 04/20/2014 FINAL   Final   Organism ID, Bacteria ENTEROCOCCUS SPECIES   Final  URINE CULTURE     Status: None    Collection Time    04/19/14  9:58 PM      Result Value Ref Range Status   Specimen Description URINE, CATHETERIZED   Final   Special Requests NONE   Final   Culture  Setup Time     Final   Value:  04/20/2014 13:56     Performed at [place]     Final   Value: NO GROWTH     Performed at [facility]   Culture     Final   Value: NO GROWTH     Performed at [facility]   Report Status 04/21/2014 FINAL   Final  MRSA PCR SCREENING     Status: None   Collection Time    04/20/14  6:23 AM      Result Value Ref Range Status   MRSA by PCR NEGATIVE  NEGATIVE Final   Comment:            The GeneXpert MRSA Assay (FDA     approved for NASAL specimens     only), is one component of a     comprehensive MRSA colonization     surveillance program. It is not     intended to diagnose MRSA     infection nor to guide or     monitor treatment for     MRSA infections.  CLOSTRIDIUM DIFFICILE BY PCR     Status: Abnormal   Collection Time    04/21/14  3:29 AM      Result Value Ref Range Status   C difficile by pcr POSITIVE (*) NEGATIVE Final   Comment: REPEATED TO VERIFY     HEATH,S AT 2831 BY GODFREY,O ON 04/21/14     Studies: Dg Abd Acute W/chest  04/19/2014   CLINICAL DATA:  Dehydration and weakness for a week. Fluid in the abdomen.  EXAM: ACUTE ABDOMEN SERIES (ABDOMEN 2 VIEW & CHEST 1 VIEW)  COMPARISON:  Chest 02/25/2014.  Abdomen series 718 2015.  FINDINGS: Shallow inspiration. Atelectasis in the lung bases. Normal heart size and pulmonary vascularity. No focal airspace disease or consolidation in the lungs. No blunting of costophrenic angles. No pneumothorax. Mediastinal contours appear intact.  Abdominal distention with diffuse underpenetration consistent with ascites. Scattered gas and stool in the colon. No small or large bowel distention. No free intra-abdominal air. No abnormal air-fluid levels noted.  IMPRESSION: Shallow inspiration with atelectasis  in the lung bases. Nonobstructive bowel gas pattern. Abdominal distention with diffuse underpenetration suggesting ascites.   Electronically Signed   By: Lucienne Capers M.D.   On: 04/19/2014 23:22    Scheduled Meds: . albumin human  25 g Intravenous Q6H  . cefTRIAXone (ROCEPHIN)  IV  1 g Intravenous Q24H  . pantoprazole (PROTONIX) IV  40 mg Intravenous Q12H  . phosphorus  250 mg Oral BID  . saccharomyces boulardii  250 mg Oral BID  . sodium bicarbonate  1,300 mg Oral BID  . sodium chloride  3 mL Intravenous Q12H  . vancomycin  125 mg Oral 4 times per day   Continuous Infusions: . sodium chloride Stopped (04/21/14 1105)    Principal Problem:   Sepsis Active Problems:   Colitis   Ascites   Acute renal failure   Sinus tachycardia   Cirrhosis   Anemia   Hypokalemia   C. difficile diarrhea    Time spent: 37mins    Hersey Maclellan  [facility] Pager [phone]. If 7PM-7AM, please contact night-coverage at [email or web address], password [facility] 04/21/2014, 12:41 PM  LOS: 2 days

## 2014-04-21 NOTE — Progress Notes (Addendum)
Subjective: Interval History: has complaints diarrhea but is getting better. She had only 2 bowel movements since last night. Consistency also has has changed and is becoming less watery. Presently she doesn't have any abdominal pain..  Objective: Vital signs in last 24 hours: Temp:  [97.6 F (36.4 C)-98.2 F (36.8 C)] 97.6 F (36.4 C) (09/12 0800) Pulse Rate:  [84-100] 89 (09/12 0700) Resp:  [13-26] 13 (09/12 0700) BP: (84-101)/(41-62) 96/59 mmHg (09/12 0821) SpO2:  [86 %-100 %] 86 % (09/12 0700) Weight:  [59.4 kg (130 lb 15.3 oz)] 59.4 kg (130 lb 15.3 oz) (09/12 0500) Weight change: -4.104 kg (-9 lb 0.8 oz)  Intake/Output from previous day: 09/11 0701 - 09/12 0700 In: 2850 [P.O.:500; I.V.:2300; IV Piggyback:50] Out: 700 [Urine:700] Intake/Output this shift:    General appearance: alert, cooperative and no distress Resp: clear to auscultation bilaterally Cardio: regular rate and rhythm, S1, S2 normal, no murmur, click, rub or gallop GI: Distended but nontender Extremities: edema Trace to 1+ edema bilaterally  Lab Results:  Recent Labs  04/20/14 0710 04/21/14 0537  WBC 10.2 10.8*  HGB 8.4* 8.3*  HCT 24.1* 23.9*  PLT 201 202   BMET:  Recent Labs  04/20/14 0710 04/21/14 0537  NA 134* 132*  K 3.9 3.7  CL 100 98  CO2 19 17*  GLUCOSE 126* 112*  BUN 41* 35*  CREATININE 3.61* 3.03*  CALCIUM 7.0* 6.9*   No results found for this basename: PTH,  in the last 72 hours Iron Studies: No results found for this basename: IRON, TIBC, TRANSFERRIN, FERRITIN,  in the last 72 hours  Studies/Results: Dg Abd Acute W/chest  04/19/2014   CLINICAL DATA:  Dehydration and weakness for a week. Fluid in the abdomen.  EXAM: ACUTE ABDOMEN SERIES (ABDOMEN 2 VIEW & CHEST 1 VIEW)  COMPARISON:  Chest 02/25/2014.  Abdomen series 718 2015.  FINDINGS: Shallow inspiration. Atelectasis in the lung bases. Normal heart size and pulmonary vascularity. No focal airspace disease or consolidation in the  lungs. No blunting of costophrenic angles. No pneumothorax. Mediastinal contours appear intact.  Abdominal distention with diffuse underpenetration consistent with ascites. Scattered gas and stool in the colon. No small or large bowel distention. No free intra-abdominal air. No abnormal air-fluid levels noted.  IMPRESSION: Shallow inspiration with atelectasis in the lung bases. Nonobstructive bowel gas pattern. Abdominal distention with diffuse underpenetration suggesting ascites.   Electronically Signed   By: Lucienne Capers M.D.   On: 04/19/2014 23:22    I have reviewed the patient's current medications.  Assessment/Plan: Problem #1 acute kidney injury superimposed on chronic. Her BUN and creatinine is slightly improving. Presently she is none oliguric. Problem #2 anemia: Her hemoglobin and hematocrit is below her target range Problem #3 metabolic bone disease: Her calcium is was in range but phosphorus was low. Patient was put on Neutra-Phos and her phosphorus has improved. Problem #4 C. difficile colitis colon improving Problem #5 liver cirrhosis Problem #6 bipolar disorder Problem #7 anasarca: Patient is ascites, she started having some leg swelling possibly related to hydration. Problem #8 metabolic acidosis Problem #9 hypokalemia: Potassium has corrected. Plan: We'll continue his Neutra-Phos for today Will increase her  sodium bicarbonate to  650 mg 2 tablets by mouth twice a day We'll decrease her IV fluid 50 cc per hour We'll check her basic metabolic panel, phosphorus in the morning.     LOS: 2 days   Wanda [name] S 04/21/2014,8:28 AM

## 2014-04-21 NOTE — Progress Notes (Signed)
CRITICAL VALUE ALERT  Critical value received:  Stool Positive for C. Diff   Date of notification:  04/21/14  Time of notification:  0712  Critical value read back:Yes.    Nurse who received alert:  Penni Homans, RN   MD notified (1st page):  Dr. Roderic Palau  Time of first page:  0712  MD notified (2nd page):  Time of second page:  Responding MD:  Dr. Roderic Palau  Time MD responded:

## 2014-04-22 LAB — COMPREHENSIVE METABOLIC PANEL
ALK PHOS: 135 U/L — AB (ref 39–117)
ALT: 13 U/L (ref 0–35)
AST: 76 U/L — ABNORMAL HIGH (ref 0–37)
Albumin: 2.5 g/dL — ABNORMAL LOW (ref 3.5–5.2)
Anion gap: 17 — ABNORMAL HIGH (ref 5–15)
BUN: 29 mg/dL — AB (ref 6–23)
CALCIUM: 7.3 mg/dL — AB (ref 8.4–10.5)
CO2: 17 mEq/L — ABNORMAL LOW (ref 19–32)
Chloride: 100 mEq/L (ref 96–112)
Creatinine, Ser: 2.08 mg/dL — ABNORMAL HIGH (ref 0.50–1.10)
GFR calc Af Amer: 33 mL/min — ABNORMAL LOW (ref >90)
GFR calc non Af Amer: 29 mL/min — ABNORMAL LOW (ref >90)
Glucose, Bld: 99 mg/dL (ref 70–99)
POTASSIUM: 3.4 meq/L — AB (ref 3.7–5.3)
Sodium: 134 mEq/L — ABNORMAL LOW (ref 137–147)
TOTAL PROTEIN: 6.7 g/dL (ref 6.0–8.3)
Total Bilirubin: 6.6 mg/dL — ABNORMAL HIGH (ref 0.3–1.2)

## 2014-04-22 LAB — CBC
HEMATOCRIT: 22.2 % — AB (ref 36.0–46.0)
Hemoglobin: 7.7 g/dL — ABNORMAL LOW (ref 12.0–15.0)
MCH: 31.8 pg (ref 26.0–34.0)
MCHC: 34.7 g/dL (ref 30.0–36.0)
MCV: 91.7 fL (ref 78.0–100.0)
Platelets: 179 10*3/uL (ref 150–400)
RBC: 2.42 MIL/uL — ABNORMAL LOW (ref 3.87–5.11)
RDW: 23.8 % — AB (ref 11.5–15.5)
WBC: 9.3 10*3/uL (ref 4.0–10.5)

## 2014-04-22 LAB — MAGNESIUM: Magnesium: 1.6 mg/dL (ref 1.5–2.5)

## 2014-04-22 LAB — PHOSPHORUS: Phosphorus: 2.8 mg/dL (ref 2.3–4.6)

## 2014-04-22 LAB — CLOSTRIDIUM DIFFICILE BY PCR: CDIFFPCR: POSITIVE — AB

## 2014-04-22 MED ORDER — TORSEMIDE 20 MG PO TABS
40.0000 mg | ORAL_TABLET | Freq: Every day | ORAL | Status: DC
  Administered 2014-04-22 – 2014-04-25 (×4): 40 mg via ORAL
  Filled 2014-04-22 (×4): qty 2

## 2014-04-22 MED ORDER — POTASSIUM CHLORIDE CRYS ER 20 MEQ PO TBCR
40.0000 meq | EXTENDED_RELEASE_TABLET | Freq: Every day | ORAL | Status: DC
  Administered 2014-04-22 – 2014-04-23 (×2): 40 meq via ORAL
  Filled 2014-04-22 (×2): qty 2

## 2014-04-22 MED ORDER — VANCOMYCIN 50 MG/ML ORAL SOLUTION
ORAL | Status: AC
  Filled 2014-04-22: qty 5

## 2014-04-22 NOTE — Progress Notes (Signed)
Spoke with Dr. Ulice Bold about pt's request for anti-anxiety med. No new orders for this received. Patient advised. She requested to speak with Dr. Ulice Bold who is on the unit and was notified of her request.

## 2014-04-22 NOTE — Progress Notes (Signed)
ANTIBIOTIC CONSULT NOTE follow up  Pharmacy Consult for Rocephin Indication: Urinary Tract Infection   Allergies  Allergen Reactions  . Lasix [Furosemide]     "doesn't work"   Patient Measurements: Height: 5\' 2"  (157.5 cm) Weight: 146 lb 2.6 oz (66.3 kg) IBW/kg (Calculated) : 50.1  Vital Signs: Temp: 98.3 F (36.8 C) (09/13 0744) Temp src: Oral (09/13 0400) BP: 113/58 mmHg (09/13 0820) Pulse Rate: 112 (09/13 0820)  Labs:  Recent Labs  04/20/14 0710 04/21/14 0537 04/22/14 0504  WBC 10.2 10.8* 9.3  HGB 8.4* 8.3* 7.7*  PLT 201 202 179  CREATININE 3.61* 3.03* 2.08*   Estimated Creatinine Clearance: 32.4 ml/min (by C-G formula based on Cr of 2.08).  No results found for this basename: VANCOTROUGH, Corlis Leak, VANCORANDOM, GENTTROUGH, GENTPEAK, GENTRANDOM, TOBRATROUGH, TOBRAPEAK, TOBRARND, AMIKACINPEAK, AMIKACINTROU, AMIKACIN,  in the last 72 hours   Microbiology: Recent Results (from the past 720 hour(s))  CLOSTRIDIUM DIFFICILE BY PCR     Status: Abnormal   Collection Time    04/12/14  9:40 PM      Result Value Ref Range Status   C difficile by pcr Detected (*) Not Detected Final   Comment: This test is for use only with liquid or soft stools;     performance characteristics of other clinical specimen     types have not been established.           This assay was performed by Cepheid GeneXpert(R) PCR.     The performance characteristics of this assay have     been determined by [facility]. Performance     characteristics refer to the analytical performance     of the test.  ANAEROBIC CULTURE     Status: None   Collection Time    04/13/14  3:25 PM      Result Value Ref Range Status   Specimen Description ASCITIC FLUID   Final   Special Requests NONE   Final   Gram Stain     Final   Value: FEW WBC PRESENT, PREDOMINANTLY MONONUCLEAR     NO ORGANISMS SEEN     Performed at [facility]   Culture     Final   Value: NO ANAEROBES ISOLATED   Performed at [facility]   Report Status 04/19/2014 FINAL   Final  BODY FLUID CULTURE     Status: None   Collection Time    04/13/14  3:25 PM      Result Value Ref Range Status   Specimen Description PERITONEAL FLUID   Final   Special Requests NONE   Final   Gram Stain     Final   Value: FEW WBC PRESENT, PREDOMINANTLY MONONUCLEAR     NO ORGANISMS SEEN     Performed at [facility]   Culture     Final   Value: NO GROWTH 3 DAYS     Performed at [facility]   Report Status 04/17/2014 FINAL   Final  URINE CULTURE     Status: None   Collection Time    04/17/14 12:12 PM      Result Value Ref Range Status   Specimen Description URINE, CLEAN CATCH   Final   Special Requests IMMUNE:COMPROMISED   Final   Culture  Setup Time     Final   Value: 04/17/2014 18:58     Performed at [place]     Final   Value: 65,000 COLONIES/ML  Performed at [facility]     Final   Value: ENTEROCOCCUS SPECIES     Performed at [facility]   Report Status 04/20/2014 FINAL   Final   Organism ID, Bacteria ENTEROCOCCUS SPECIES   Final  URINE CULTURE     Status: None   Collection Time    04/19/14  9:58 PM      Result Value Ref Range Status   Specimen Description URINE, CATHETERIZED   Final   Special Requests NONE   Final   Culture  Setup Time     Final   Value: 04/20/2014 13:56     Performed at [place]     Final   Value: NO GROWTH     Performed at [facility]   Culture     Final   Value: NO GROWTH     Performed at [facility]   Report Status 04/21/2014 FINAL   Final  MRSA PCR SCREENING     Status: None   Collection Time    04/20/14  6:23 AM      Result Value Ref Range Status   MRSA by PCR NEGATIVE  NEGATIVE Final   Comment:            The GeneXpert MRSA Assay (FDA     approved for NASAL specimens     only), is one component of a     comprehensive MRSA colonization      surveillance program. It is not     intended to diagnose MRSA     infection nor to guide or     monitor treatment for     MRSA infections.  CLOSTRIDIUM DIFFICILE BY PCR     Status: Abnormal   Collection Time    04/21/14  3:29 AM      Result Value Ref Range Status   C difficile by pcr POSITIVE (*) NEGATIVE Final   Comment: REPEATED TO VERIFY     HEATH,S AT 0712 BY GODFREY,O ON 04/21/14     CRITICAL RESULT CALLED TO, READ BACK BY AND VERIFIED WITH:     HEATH,S AT 0712 BY GODFREY,O ON 04/21/14   Medical History: Past Medical History  Diagnosis Date  . GERD (gastroesophageal reflux disease)   . 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 c/w steatohepatitis  . Folate deficiency 09/2013  . Anasarca 10/10/2013  . Hematemesis/vomiting blood 02/24/2014  . Acute blood loss anemia 02/25/2014    Status post transfusion  . Macrocytosis 02/28/2014  . 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 Acute renal failure 09/2013    Pre-renal- resolved   Medications:  Prescriptions prior to admission  Medication Sig Dispense Refill  . ondansetron (ZOFRAN) 8 MG tablet Take 1 tablet (8 mg total) by mouth every 4 (four) hours as needed.  8 tablet  0  . oxyCODONE (ROXICODONE) 5 MG immediate release tablet Take 1 tablet (5 mg total) by mouth every 6 (six) hours as needed for severe pain.  20 tablet  0  . pantoprazole (PROTONIX) 40 MG tablet Take 1 tablet (40 mg total) by mouth 2 (two) times daily before a meal. For management of GI bleeding.  60 tablet  3  . potassium chloride (MICRO-K) 10 MEQ CR capsule Take 10 mEq by mouth 2 (two) times daily.      [name] spironolactone (ALDACTONE) 25 MG tablet Take  1 tablet (25 mg total) by mouth daily. For treatment of swelling  30 tablet  3  . torsemide (DEMADEX) 20 MG tablet Take 2 tablets (40 mg total) by mouth daily. Take for treatment of swelling.  60 tablet  3  . vancomycin (VANCOCIN) 125 MG capsule Take 1 capsule (125 mg  total) by mouth 3 (three) times daily.  30 capsule  0   Scheduled:  . cefTRIAXone (ROCEPHIN)  IV  1 g Intravenous Q24H  . pantoprazole (PROTONIX) IV  40 mg Intravenous Q12H  . phosphorus  250 mg Oral BID  . saccharomyces boulardii  250 mg Oral BID  . sodium bicarbonate  1,300 mg Oral BID  . sodium chloride  3 mL Intravenous Q12H  . vancomycin  125 mg Oral 4 times per day   Assessment: Improving.  No renal adjustment needed for Rocephin.  Goal of Therapy:  Eradicate infection.  Plan:  Continue Rocephin 1gm IV every 24 hours.  Switch to oral antibiotic when appropriate per physician assessment Pharmacy will sign off.    Ena Dawley, [facility] 04/22/2014,9:02 AM

## 2014-04-22 NOTE — Progress Notes (Signed)
Subjective: Interval History: Patient feels much better. Her diarrhea has improved and had only 2 times yesterday. She denies difficulty in breathing  Objective: Vital signs in last 24 hours: Temp:  [97.3 F (36.3 C)-98.5 F (36.9 C)] 98.3 F (36.8 C) (09/13 0744) Pulse Rate:  [87-114] 112 (09/13 0820) Resp:  [14-29] 25 (09/13 0820) BP: (81-113)/(43-76) 113/58 mmHg (09/13 0820) SpO2:  [94 %-100 %] 99 % (09/13 0820) Weight:  [66.3 kg (146 lb 2.6 oz)] 66.3 kg (146 lb 2.6 oz) (09/13 0500) Weight change: 6.9 kg (15 lb 3.4 oz)  Intake/Output from previous day: 09/12 0701 - 09/13 0700 In: 2371.7 [P.O.:820; I.V.:1101.7; IV Piggyback:450] Out: 800 [Urine:800] Intake/Output this shift: Total I/O In: -  Out: 300 [Urine:300]  General appearance: alert, cooperative and no distress Resp: clear to auscultation bilaterally Cardio: regular rate and rhythm, S1, S2 normal, no murmur, click, rub or gallop GI: Distended but nontender Extremities: edema Trace to 1+ edema bilaterally  Lab Results:  Recent Labs  04/21/14 0537 04/22/14 0504  WBC 10.8* 9.3  HGB 8.3* 7.7*  HCT 23.9* 22.2*  PLT 202 179   BMET:   Recent Labs  04/21/14 0537 04/22/14 0504  NA 132* 134*  K 3.7 3.4*  CL 98 100  CO2 17* 17*  GLUCOSE 112* 99  BUN 35* 29*  CREATININE 3.03* 2.08*  CALCIUM 6.9* 7.3*   No results found for this basename: PTH,  in the last 72 hours Iron Studies: No results found for this basename: IRON, TIBC, TRANSFERRIN, FERRITIN,  in the last 72 hours  Studies/Results: No results found.  I have reviewed the patient's current medications.  Assessment/Plan: Problem #1 acute kidney injury superimposed on chronic. Her renal function is improving progressively Problem #2 anemia: Her hemoglobin and hematocrit is below her target range . It is also declining Problem #3 metabolic bone disease: Her calcium is was in range but phosphorus was low. Patient was put on Neutra-Phos and her phosphorus  has improved. Problem #4 C. difficile colitis presently she is improving Problem #5 liver cirrhosis Problem #6 bipolar disorder Problem #7 anasarca: Patient is ascites: patient started to have increasing edema .Presently off diuretics and on hydration Problem #8 metabolic acidosis:She is on sodium bicarbonate her CO2 is still low Problem #9 hypokalemia: Potassium is low todays. Plan: We'll continue his Neutra-Phos for today Will d/c ivf We'll star on Demadex 40 mg po once a day We will start patient on Kcl 40 meq po once a day We'll check her basic metabolic panel, phosphorus in the morning.     LOS: 3 days   Javaughn Opdahl S 04/22/2014,8:52 AM

## 2014-04-22 NOTE — Progress Notes (Signed)
Dr. Ulice Bold at [facility]. He spoke with the patient about anxiety treatment.

## 2014-04-22 NOTE — Progress Notes (Signed)
[facility] PROGRESS NOTE  Wanda [name] XBD:[id number] DOB: [birth year] DOA: 04/19/2014 PCP: Default, Provider, MD  Assessment/Plan: 1. Clostridium difficile colitis. Patient started on oral vancomycin 9/10. She is also on florastor. Diarrhea is improving. Continue current treatments. Will plan on total of 14 days of antibiotics. 2. Sepsis related to #1. Blood pressure is improved with IV hydration. Continue antibiotics. She's been afebrile. We'll follow cultures 3. Acute renal failure. Possibly related to volume depletion and dehydration. Appreciate nephrology input. Creatinine improving with IV hydration.  IV fluids discontinued today and she is being started on demadex. She has significant edema. 4. Hypotension. Likely related to volume depletion and underlying cirrhosis. Patient appears to be mentating appropriately. Blood pressure improving, but still having some lows. 5. Metabolic acidosis, likely related to persistent diarrhea/sepsis. She's receiving IV fluids and is on oral bicarbonate replacement 6. 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 Followed by GI as an outpatient. Patient has worsening ascites related to hypoalbuminemia and fluid resuscitation. She has received albumin infusion. Will order repeat paracentesis for AM. 7. Anemia, likely related to chronic disease as well as acute illness. Transfused 2 units PRBCs since admission. Stool occult blood is pending. 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 Continue to follow.    Code Status: full code Family Communication: discussed with patient Disposition Plan: discharge home once improved   Consultants:  Nephrology  Procedures:    Antibiotics:  Vancomycin 9/10>>  Rocephin 9/10>>9/12  HPI/Subjective: Feels that diarrhea is improving.  Had 2 bowel movements yesterday that were reported as brown. Denies shortness of breath.  Feels that abdomen is more distended  Objective: Filed Vitals:   04/22/14 0900  BP:  101/57  Pulse: 103  Temp:   Resp: 27    Intake/Output Summary (Last 24 hours) at 04/22/14 1004 Last data filed at 04/22/14 0900  Gross per 24 hour  Intake 2141.67 ml  Output   1100 ml  Net 1041.67 ml   Filed Weights   04/19/14 2315 04/21/14 0500 04/22/14 0500  Weight: 55.7 kg (122 lb 12.7 oz) 59.4 kg (130 lb 15.3 oz) 66.3 kg (146 lb 2.6 oz)    Exam:   General:  NAD, jaundiced  Cardiovascular: s1, s2, tachycardic  Respiratory: cta b  Abdomen: soft, increased distention, non tender  Musculoskeletal: 1+ edema b/l   Data Reviewed: Basic Metabolic Panel:  Recent Labs Lab 04/17/14 1110 04/19/14 2049 04/20/14 0710 04/21/14 0537 04/22/14 0504  NA 135* 134* 134* 132* 134*  K 3.0* 2.9* 3.9 3.7 3.4*  CL 92* 93* 100 98 100  CO2 25 22 19  17* 17*  GLUCOSE 124* 132* 126* 112* 99  BUN 42* 44* 41* 35* 29*  CREATININE 2.90* 4.13* 3.61* 3.03* 2.08*  CALCIUM 8.4 7.5* 7.0* 6.9* 7.3*  MG  --   --  1.0*  --  1.6  PHOS  --   --  1.3* 2.3 2.8   Liver Function Tests:  Recent Labs Lab 04/17/14 1110 04/19/14 2049 04/20/14 0710 04/22/14 0504  AST 130* 128* 121* 76*  ALT 17 19 17 13   ALKPHOS 196* 185* 160* 135*  BILITOT 10.9* 8.6* 8.3* 6.6*  PROT 8.5* 7.9 7.2 6.7  ALBUMIN 2.5* 2.2* 1.9* 2.5*    Recent Labs Lab 04/19/14 2049  LIPASE 79*    Recent Labs Lab 04/17/14 1143 04/19/14 2049  AMMONIA 60 69*   CBC:  Recent Labs Lab 04/17/14 1110 04/19/14 2049 04/20/14 0710 04/21/14 0537 04/22/14 0504  WBC 12.5* 14.3* 10.2 10.8* 9.3  NEUTROABS  9.7* 10.7*  --   --   --   HGB 7.5* 6.5* 8.4* 8.3* 7.7*  HCT 21.8* 18.7* 24.1* 23.9* 22.2*  MCV 97.3 97.9 91.6 93.4 91.7  PLT 262 254 201 202 179   Cardiac Enzymes: No results found for this basename: CKTOTAL, CKMB, CKMBINDEX, TROPONINI,  in the last 168 hours BNP (last 3 results) No results found for this basename: PROBNP,  in the last 8760 hours CBG: No results found for this basename: GLUCAP,  in the last 168  hours  Recent Results (from the past 240 hour(s))  CLOSTRIDIUM DIFFICILE BY PCR     Status: Abnormal   Collection Time    04/12/14  9:40 PM      Result Value Ref Range Status   C difficile by pcr Detected (*) Not Detected Final   Comment: This test is for use only with liquid or soft stools;     performance characteristics of other clinical specimen     types have not been established.           This assay was performed by Cepheid GeneXpert(R) PCR.     The performance characteristics of this assay have     been determined by [facility]. Performance     characteristics refer to the analytical performance     of the test.  ANAEROBIC CULTURE     Status: None   Collection Time    04/13/14  3:25 PM      Result Value Ref Range Status   Specimen Description ASCITIC FLUID   Final   Special Requests NONE   Final   Gram Stain     Final   Value: FEW WBC PRESENT, PREDOMINANTLY MONONUCLEAR     NO ORGANISMS SEEN     Performed at [facility]   Culture     Final   Value: NO ANAEROBES ISOLATED     Performed at [facility]   Report Status 04/19/2014 FINAL   Final  BODY FLUID CULTURE     Status: None   Collection Time    04/13/14  3:25 PM      Result Value Ref Range Status   Specimen Description PERITONEAL FLUID   Final   Special Requests NONE   Final   Gram Stain     Final   Value: FEW WBC PRESENT, PREDOMINANTLY MONONUCLEAR     NO ORGANISMS SEEN     Performed at [facility]   Culture     Final   Value: NO GROWTH 3 DAYS     Performed at [facility]   Report Status 04/17/2014 FINAL   Final  URINE CULTURE     Status: None   Collection Time    04/17/14 12:12 PM      Result Value Ref Range Status   Specimen Description URINE, CLEAN CATCH   Final   Special Requests IMMUNE:COMPROMISED   Final   Culture  Setup Time     Final   Value: 04/17/2014 18:58     Performed at [place]     Final   Value: 65,000 COLONIES/ML      Performed at [facility]   Culture     Final   Value: ENTEROCOCCUS SPECIES     Performed at [facility]   Report Status 04/20/2014 FINAL   Final   Organism ID, Bacteria ENTEROCOCCUS SPECIES   Final  URINE CULTURE  Status: None   Collection Time    04/19/14  9:58 PM      Result Value Ref Range Status   Specimen Description URINE, CATHETERIZED   Final   Special Requests NONE   Final   Culture  Setup Time     Final   Value: 04/20/2014 13:56     Performed at [place]     Final   Value: NO GROWTH     Performed at [facility]   Culture     Final   Value: NO GROWTH     Performed at [facility]   Report Status 04/21/2014 FINAL   Final  MRSA PCR SCREENING     Status: None   Collection Time    04/20/14  6:23 AM      Result Value Ref Range Status   MRSA by PCR NEGATIVE  NEGATIVE Final   Comment:            The GeneXpert MRSA Assay (FDA     approved for NASAL specimens     only), is one component of a     comprehensive MRSA colonization     surveillance program. It is not     intended to diagnose MRSA     infection nor to guide or     monitor treatment for     MRSA infections.  CLOSTRIDIUM DIFFICILE BY PCR     Status: Abnormal   Collection Time    04/21/14  3:29 AM      Result Value Ref Range Status   C difficile by pcr POSITIVE (*) NEGATIVE Final   Comment: REPEATED TO VERIFY     HEATH,S AT 0712 BY GODFREY,O ON 04/21/14     CRITICAL RESULT CALLED TO, READ BACK BY AND VERIFIED WITH:     HEATH,S AT 2778 BY GODFREY,O ON 04/21/14     Studies: No results found.  Scheduled Meds: . cefTRIAXone (ROCEPHIN)  IV  1 g Intravenous Q24H  . pantoprazole (PROTONIX) IV  40 mg Intravenous Q12H  . phosphorus  250 mg Oral BID  . potassium chloride  40 mEq Oral Daily  . saccharomyces boulardii  250 mg Oral BID  . sodium bicarbonate  1,300 mg Oral BID  . sodium chloride  3 mL Intravenous Q12H  . torsemide  40 mg Oral Daily   . vancomycin  125 mg Oral 4 times per day   Continuous Infusions:    Principal Problem:   Sepsis Active Problems:   Colitis   Ascites   Acute renal failure   Sinus tachycardia   Cirrhosis   Anemia   Hypokalemia   C. difficile diarrhea    Time spent: 78mins    Rodneshia Greenhouse  [facility] Pager [phone]. If 7PM-7AM, please contact night-coverage at [email or web address], password [facility] 04/22/2014, 10:04 AM  LOS: 3 days

## 2014-04-22 NOTE — Progress Notes (Signed)
Assumed care of patient from [employer].

## 2014-04-23 ENCOUNTER — Inpatient Hospital Stay (HOSPITAL_COMMUNITY): Payer: Medicare Other

## 2014-04-23 LAB — GI PATHOGEN PANEL BY PCR, STOOL
C DIFFICILE TOXIN A/B: NEGATIVE
CRYPTOSPORIDIUM BY PCR: NEGATIVE
Campylobacter by PCR: NEGATIVE
E coli (ETEC) LT/ST: NEGATIVE
E coli (STEC): NEGATIVE
E coli 0157 by PCR: NEGATIVE
G lamblia by PCR: NEGATIVE
Norovirus GI/GII: NEGATIVE
ROTAVIRUS A BY PCR: NEGATIVE
Salmonella by PCR: NEGATIVE
Shigella by PCR: NEGATIVE

## 2014-04-23 LAB — CBC
HEMATOCRIT: 23.2 % — AB (ref 36.0–46.0)
HEMOGLOBIN: 8 g/dL — AB (ref 12.0–15.0)
MCH: 31.7 pg (ref 26.0–34.0)
MCHC: 34.5 g/dL (ref 30.0–36.0)
MCV: 92.1 fL (ref 78.0–100.0)
Platelets: 173 10*3/uL (ref 150–400)
RBC: 2.52 MIL/uL — AB (ref 3.87–5.11)
RDW: 24.3 % — ABNORMAL HIGH (ref 11.5–15.5)
WBC: 10.6 10*3/uL — AB (ref 4.0–10.5)

## 2014-04-23 LAB — BODY FLUID CELL COUNT WITH DIFFERENTIAL
Eos, Fluid: 0 %
LYMPHS FL: 23 %
MONOCYTE-MACROPHAGE-SEROUS FLUID: 66 % (ref 50–90)
NEUTROPHIL FLUID: 9 % (ref 0–25)
Other Cells, Fluid: 2 %
WBC FLUID: 53 uL (ref 0–1000)

## 2014-04-23 LAB — BASIC METABOLIC PANEL
Anion gap: 18 — ABNORMAL HIGH (ref 5–15)
BUN: 24 mg/dL — ABNORMAL HIGH (ref 6–23)
CALCIUM: 7.4 mg/dL — AB (ref 8.4–10.5)
CO2: 19 meq/L (ref 19–32)
Chloride: 101 mEq/L (ref 96–112)
Creatinine, Ser: 1.68 mg/dL — ABNORMAL HIGH (ref 0.50–1.10)
GFR calc Af Amer: 43 mL/min — ABNORMAL LOW (ref >90)
GFR calc non Af Amer: 37 mL/min — ABNORMAL LOW (ref >90)
GLUCOSE: 96 mg/dL (ref 70–99)
Potassium: 3.2 mEq/L — ABNORMAL LOW (ref 3.7–5.3)
SODIUM: 138 meq/L (ref 137–147)

## 2014-04-23 LAB — LACTATE DEHYDROGENASE, PLEURAL OR PERITONEAL FLUID: LD, Fluid: 48 U/L — ABNORMAL HIGH (ref 3–23)

## 2014-04-23 LAB — PHOSPHORUS: Phosphorus: 3.8 mg/dL (ref 2.3–4.6)

## 2014-04-23 MED ORDER — OXYCODONE HCL 5 MG PO TABS
5.0000 mg | ORAL_TABLET | Freq: Four times a day (QID) | ORAL | Status: DC | PRN
  Administered 2014-04-23 – 2014-04-27 (×15): 10 mg via ORAL
  Filled 2014-04-23 (×15): qty 2

## 2014-04-23 MED ORDER — POTASSIUM CHLORIDE CRYS ER 20 MEQ PO TBCR
40.0000 meq | EXTENDED_RELEASE_TABLET | Freq: Two times a day (BID) | ORAL | Status: DC
  Administered 2014-04-23 – 2014-04-24 (×3): 40 meq via ORAL
  Filled 2014-04-23 (×3): qty 2

## 2014-04-23 MED ORDER — K PHOS MONO-SOD PHOS DI & MONO 155-852-130 MG PO TABS
250.0000 mg | ORAL_TABLET | Freq: Every day | ORAL | Status: DC
  Administered 2014-04-24 – 2014-04-25 (×2): 250 mg via ORAL
  Filled 2014-04-23 (×4): qty 1

## 2014-04-23 NOTE — Progress Notes (Signed)
[facility] PROGRESS NOTE  Wanda [name] ZOX:[id number] DOB: 10/15/1975 DOA: 04/19/2014 PCP: Default, Provider, MD  Assessment/Plan: 1. Clostridium difficile colitis. Patient started on oral vancomycin 9/10. She is also on florastor. Diarrhea is improving. Continue current treatments. Will plan on total of 14 days of antibiotics. 2. Sepsis related to #1. Blood pressure is improved with IV hydration. Continue antibiotics. She's been afebrile. We'll follow cultures 3. Acute renal failure. Possibly related to volume depletion and dehydration. Appreciate nephrology input. Creatinine was improving with IV hydration.  IV fluids were then discontinued and she was started on demadex. She has significant edema. Creatinine continues to improve. 4. Hypotension. Likely related to volume depletion and underlying cirrhosis. Patient appears to be mentating appropriately. Blood pressure improving, but still having some lows. 5. Metabolic acidosis, likely related to persistent diarrhea/sepsis. She's receiving IV fluids and is on oral bicarbonate replacement. Appears to be improving. 6. 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 Followed by GI as an outpatient. Patient has worsening ascites related to hypoalbuminemia and fluid resuscitation. She has received albumin infusions. Paracentesis has been ordered. 7. Anemia, likely related to chronic disease as well as acute illness. Transfused 2 units PRBCs since admission. No evidence of gross bleeding. 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 Hemoglobin appears to be stable. Continue to follow. 8. Hypokalemia. replace    Code Status: full code Family Communication: discussed with patient Disposition Plan: discharge home once improved   Consultants:  Nephrology  Procedures:    Antibiotics:  Vancomycin 9/10>>  Rocephin 9/10>>9/12  HPI/Subjective: Had 3 bowel movements overnight.  Complains of chronic pain in her legs.  Objective: Filed Vitals:    04/23/14 0900  BP: 102/56  Pulse: 102  Temp:   Resp: 21    Intake/Output Summary (Last 24 hours) at 04/23/14 0958 Last data filed at 04/23/14 0700  Gross per 24 hour  Intake 934.17 ml  Output   2700 ml  Net -1765.83 ml   Filed Weights   04/21/14 0500 04/22/14 0500 04/23/14 0500  Weight: 59.4 kg (130 lb 15.3 oz) 66.3 kg (146 lb 2.6 oz) 66.8 kg (147 lb 4.3 oz)    Exam:   General:  NAD, jaundiced  Cardiovascular: s1, s2, tachycardic  Respiratory: cta b  Abdomen: soft, increased distention, non tender, bs+  Musculoskeletal: 1+ edema b/l   Data Reviewed: Basic Metabolic Panel:  Recent Labs Lab 04/19/14 2049 04/20/14 0710 04/21/14 0537 04/22/14 0504 04/23/14 0449  NA 134* 134* 132* 134* 138  K 2.9* 3.9 3.7 3.4* 3.2*  CL 93* 100 98 100 101  CO2 22 19 17* 17* 19  GLUCOSE 132* 126* 112* 99 96  BUN 44* 41* 35* 29* 24*  CREATININE 4.13* 3.61* 3.03* 2.08* 1.68*  CALCIUM 7.5* 7.0* 6.9* 7.3* 7.4*  MG  --  1.0*  --  1.6  --   PHOS  --  1.3* 2.3 2.8 3.8   Liver Function Tests:  Recent Labs Lab 04/17/14 1110 04/19/14 2049 04/20/14 0710 04/22/14 0504  AST 130* 128* 121* 76*  ALT 17 19 17 13   ALKPHOS 196* 185* 160* 135*  BILITOT 10.9* 8.6* 8.3* 6.6*  PROT 8.5* 7.9 7.2 6.7  ALBUMIN 2.5* 2.2* 1.9* 2.5*    Recent Labs Lab 04/19/14 2049  LIPASE 79*    Recent Labs Lab 04/17/14 1143 04/19/14 2049  AMMONIA 60 69*   CBC:  Recent Labs Lab 04/17/14 1110 04/19/14 2049 04/20/14 0710 04/21/14 0537 04/22/14 0504 04/23/14 0449  WBC 12.5* 14.3* 10.2 10.8* 9.3  10.6*  NEUTROABS 9.7* 10.7*  --   --   --   --   HGB 7.5* 6.5* 8.4* 8.3* 7.7* 8.0*  HCT 21.8* 18.7* 24.1* 23.9* 22.2* 23.2*  MCV 97.3 97.9 91.6 93.4 91.7 92.1  PLT 262 254 201 202 179 173   Cardiac Enzymes: No results found for this basename: CKTOTAL, CKMB, CKMBINDEX, TROPONINI,  in the last 168 hours BNP (last 3 results) No results found for this basename: PROBNP,  in the last 8760  hours CBG: No results found for this basename: GLUCAP,  in the last 168 hours  Recent Results (from the past 240 hour(s))  ANAEROBIC CULTURE     Status: None   Collection Time    04/13/14  3:25 PM      Result Value Ref Range Status   Specimen Description ASCITIC FLUID   Final   Special Requests NONE   Final   Gram Stain     Final   Value: FEW WBC PRESENT, PREDOMINANTLY MONONUCLEAR     NO ORGANISMS SEEN     Performed at [facility]   Culture     Final   Value: NO ANAEROBES ISOLATED     Performed at [facility]   Report Status 04/19/2014 FINAL   Final  BODY FLUID CULTURE     Status: None   Collection Time    04/13/14  3:25 PM      Result Value Ref Range Status   Specimen Description PERITONEAL FLUID   Final   Special Requests NONE   Final   Gram Stain     Final   Value: FEW WBC PRESENT, PREDOMINANTLY MONONUCLEAR     NO ORGANISMS SEEN     Performed at [facility]   Culture     Final   Value: NO GROWTH 3 DAYS     Performed at [facility]   Report Status 04/17/2014 FINAL   Final  URINE CULTURE     Status: None   Collection Time    04/17/14 12:12 PM      Result Value Ref Range Status   Specimen Description URINE, CLEAN CATCH   Final   Special Requests IMMUNE:COMPROMISED   Final   Culture  Setup Time     Final   Value: 04/17/2014 18:58     Performed at [place]     Final   Value: 65,000 COLONIES/ML     Performed at [facility]   Culture     Final   Value: ENTEROCOCCUS SPECIES     Performed at [facility]   Report Status 04/20/2014 FINAL   Final   Organism ID, Bacteria ENTEROCOCCUS SPECIES   Final  URINE CULTURE     Status: None   Collection Time    04/19/14  9:58 PM      Result Value Ref Range Status   Specimen Description URINE, CATHETERIZED   Final   Special Requests NONE   Final   Culture  Setup Time     Final   Value: 04/20/2014 13:56     Performed at [place]     Final   Value: NO GROWTH     Performed at [facility]   Culture     Final   Value: NO GROWTH     Performed at [facility]   Report Status 04/21/2014 FINAL   Final  MRSA  PCR SCREENING     Status: None   Collection Time    04/20/14  6:23 AM      Result Value Ref Range Status   MRSA by PCR NEGATIVE  NEGATIVE Final   Comment:            The GeneXpert MRSA Assay (FDA     approved for NASAL specimens     only), is one component of a     comprehensive MRSA colonization     surveillance program. It is not     intended to diagnose MRSA     infection nor to guide or     monitor treatment for     MRSA infections.  CLOSTRIDIUM DIFFICILE BY PCR     Status: Abnormal   Collection Time    04/21/14  3:29 AM      Result Value Ref Range Status   C difficile by pcr POSITIVE (*) NEGATIVE Final   Comment: REPEATED TO VERIFY     HEATH,S AT 0712 BY GODFREY,O ON 04/21/14     CRITICAL RESULT CALLED TO, READ BACK BY AND VERIFIED WITH:     HEATH,S AT 4496 BY GODFREY,O ON 04/21/14     Studies: No results found.  Scheduled Meds: . pantoprazole (PROTONIX) IV  40 mg Intravenous Q12H  . [START ON 04/24/2014] phosphorus  250 mg Oral Daily  . potassium chloride  40 mEq Oral BID  . saccharomyces boulardii  250 mg Oral BID  . sodium bicarbonate  1,300 mg Oral BID  . sodium chloride  3 mL Intravenous Q12H  . torsemide  40 mg Oral Daily  . vancomycin  125 mg Oral 4 times per day   Continuous Infusions:    Principal Problem:   Sepsis Active Problems:   Colitis   Ascites   Acute renal failure   Sinus tachycardia   Cirrhosis   Anemia   Hypokalemia   C. difficile diarrhea    Time spent: 40mins    Marquelle Balow  [facility] Pager [phone]. If 7PM-7AM, please contact night-coverage at [email or web address], password [facility] 04/23/2014, 9:58 AM  LOS: 4 days

## 2014-04-23 NOTE — Progress Notes (Signed)
Subjective: Interval History: Still has diarrhea about 3 times yesterday and is watery but no abdominal pain. She states also her leg swelling is getting a little bit more than before. She denies any difficulty in breathing Objective: Vital signs in last 24 hours: Temp:  [97.9 F (36.6 C)-99.1 F (37.3 C)] 98.3 F (36.8 C) (09/14 0700) Pulse Rate:  [82-117] 87 (09/14 0700) Resp:  [13-27] 20 (09/14 0700) BP: (81-116)/(42-92) 93/47 mmHg (09/14 0700) SpO2:  [91 %-99 %] 92 % (09/14 0700) Weight:  [66.8 kg (147 lb 4.3 oz)] 66.8 kg (147 lb 4.3 oz) (09/14 0500) Weight change: 0.5 kg (1 lb 1.6 oz)  Intake/Output from previous day: 09/13 0701 - 09/14 0700 In: 1174.2 [P.O.:960; I.V.:164.2; IV Piggyback:50] Out: 2800 [Urine:2800] Intake/Output this shift:    Generally patient is alert and in no apparent distress No elevated JVD Chest is clear Heart: RRR no murumer Abdomen : Distended but none tender Extremities: 2+ edema   Lab Results:  Recent Labs  04/22/14 0504 04/23/14 0449  WBC 9.3 10.6*  HGB 7.7* 8.0*  HCT 22.2* 23.2*  PLT 179 173   BMET:   Recent Labs  04/22/14 0504 04/23/14 0449  NA 134* 138  K 3.4* 3.2*  CL 100 101  CO2 17* 19  GLUCOSE 99 96  BUN 29* 24*  CREATININE 2.08* 1.68*  CALCIUM 7.3* 7.4*   No results found for this basename: PTH,  in the last 72 hours Iron Studies: No results found for this basename: IRON, TIBC, TRANSFERRIN, FERRITIN,  in the last 72 hours  Studies/Results: No results found.  I have reviewed the patient's current medications.  Assessment/Plan: Problem #1 acute kidney injury superimposed on chronic. Her renal function continue to improve presently we are reaching her base line renal function Problem #2 anemia: Her hemoglobin and hematocrit is below her target range . Patient has received blood transfussion Problem #3 metabolic bone disease: Her calcium and her phosphorus in range. Patient Presently on K phos neutral 1 table po  bid Problem #4 C. difficile colitis presently she is improving Problem #5 liver cirrhosis Problem #6 bipolar disorder Problem #7 anasarca: Patient is ascites: patient started to have increasing edema . Patient on Demadex and had 2800 cc of urine out put which is reasonable Problem #8 metabolic acidosis:She is on sodium bicarbonate her CO2 is still low but improving Problem #9 hypokalemia: Patient on potassium supplement her potassium is low and declining Plan: Decrease her K phos neutral to 250 mg po once a day We'll continue with Demadex  We will increase her  Kcl 40 meq po BID Put patient on 1500 cc/day fluid restriction We'll check her basic metabolic panel, phosphorus in the morning.     LOS: 4 days   Zaquan Duffner S 04/23/2014,8:27 AM

## 2014-04-23 NOTE — Progress Notes (Signed)
Enteric Precautions and handwashing education discussed with patient. The patient verbalized understanding of education discussed with her.

## 2014-04-23 NOTE — Progress Notes (Signed)
Paracantesis complete no signs of distress 4828ml ascites removed.

## 2014-04-24 LAB — COMPREHENSIVE METABOLIC PANEL
ALT: 12 U/L (ref 0–35)
ANION GAP: 18 — AB (ref 5–15)
AST: 59 U/L — ABNORMAL HIGH (ref 0–37)
Albumin: 2.2 g/dL — ABNORMAL LOW (ref 3.5–5.2)
Alkaline Phosphatase: 128 U/L — ABNORMAL HIGH (ref 39–117)
BILIRUBIN TOTAL: 8.1 mg/dL — AB (ref 0.3–1.2)
BUN: 24 mg/dL — AB (ref 6–23)
CHLORIDE: 99 meq/L (ref 96–112)
CO2: 21 mEq/L (ref 19–32)
Calcium: 7.5 mg/dL — ABNORMAL LOW (ref 8.4–10.5)
Creatinine, Ser: 1.59 mg/dL — ABNORMAL HIGH (ref 0.50–1.10)
GFR calc non Af Amer: 40 mL/min — ABNORMAL LOW (ref >90)
GFR, EST AFRICAN AMERICAN: 46 mL/min — AB (ref >90)
Glucose, Bld: 112 mg/dL — ABNORMAL HIGH (ref 70–99)
Potassium: 3.3 mEq/L — ABNORMAL LOW (ref 3.7–5.3)
Sodium: 138 mEq/L (ref 137–147)
Total Protein: 6.3 g/dL (ref 6.0–8.3)

## 2014-04-24 LAB — CBC
HEMATOCRIT: 23.6 % — AB (ref 36.0–46.0)
Hemoglobin: 8.2 g/dL — ABNORMAL LOW (ref 12.0–15.0)
MCH: 32.3 pg (ref 26.0–34.0)
MCHC: 34.7 g/dL (ref 30.0–36.0)
MCV: 92.9 fL (ref 78.0–100.0)
Platelets: 157 10*3/uL (ref 150–400)
RBC: 2.54 MIL/uL — ABNORMAL LOW (ref 3.87–5.11)
RDW: 24.2 % — AB (ref 11.5–15.5)
WBC: 12 10*3/uL — ABNORMAL HIGH (ref 4.0–10.5)

## 2014-04-24 LAB — PHOSPHORUS: PHOSPHORUS: 4.1 mg/dL (ref 2.3–4.6)

## 2014-04-24 MED ORDER — MIDODRINE HCL 5 MG PO TABS
5.0000 mg | ORAL_TABLET | Freq: Three times a day (TID) | ORAL | Status: DC
  Administered 2014-04-24 – 2014-04-25 (×4): 5 mg via ORAL
  Filled 2014-04-24 (×4): qty 1

## 2014-04-24 NOTE — Progress Notes (Signed)
Wanda [name]  MRN: [id number]  DOB/AGE: 12-21-1975 38 y.o.  Primary Care Physician:Default, Provider, MD  Admit date: 04/19/2014  Chief Complaint:  Chief Complaint  Patient presents with  . Weakness    S-Pt presented on  04/19/2014 with  Chief Complaint  Patient presents with  . Weakness  .    Pt today feels better.    Pt offers no new complaints.    Pt had some questions regarding spironolactone.   Meds . pantoprazole (PROTONIX) IV  40 mg Intravenous Q12H  . phosphorus  250 mg Oral Daily  . potassium chloride  40 mEq Oral BID  . saccharomyces boulardii  250 mg Oral BID  . sodium bicarbonate  1,300 mg Oral BID  . sodium chloride  3 mL Intravenous Q12H  . torsemide  40 mg Oral Daily  . vancomycin  125 mg Oral 4 times per day     Physical Exam: Vital signs in last 24 hours: Temp:  [97.4 F (36.3 C)-98.5 F (36.9 C)] 97.7 F (36.5 C) (09/15 0800) Pulse Rate:  [96-116] 96 (09/14 1500) Resp:  [13-22] 16 (09/15 0800) BP: (80-104)/(39-57) 96/54 mmHg (09/15 0800) SpO2:  [88 %-99 %] 99 % (09/15 0400) Weight:  [131 lb 9.8 oz (59.7 kg)] 131 lb 9.8 oz (59.7 kg) (09/15 0500) Weight change: -15 lb 10.5 oz (-7.1 kg) Last BM Date: 04/24/14  Intake/Output from previous day: 09/14 0701 - 09/15 0700 In: 150 [P.O.:150] Out: 1250 [Urine:1250]     Physical Exam: General- pt is awake,alert, oriented to time place and person Resp- No acute REsp distress, CTA B/L NO Rhonchi CVS- S1S2 regular in rate and rhythm GIT- BS+, soft, distended present ( better) EXT- 1+ LE Edema,NO Cyanosis   Lab Results: CBC  Recent Labs  04/23/14 0449 04/24/14 0456  WBC 10.6* 12.0*  HGB 8.0* 8.2*  HCT 23.2* 23.6*  PLT 173 157    BMET  Recent Labs  04/23/14 0449 04/24/14 0456  NA 138 138  K 3.2* 3.3*  CL 101 99  CO2 19 21  GLUCOSE 96 112*  BUN 24* 24*  CREATININE 1.68* 1.59*  CALCIUM 7.4* 7.5*   Trend Creat 2015  2.15=>4.13=>1.68=>1.59           1.15=>1.95=>1.19 ( July  admission)           2.3==>1.21=> 1.08 ( April admission)           3.11==>1.36==>1.18 ( February admission)  MICRO Recent Results (from the past 240 hour(s))  URINE CULTURE     Status: None   Collection Time    04/17/14 12:12 PM      Result Value Ref Range Status   Specimen Description URINE, CLEAN CATCH   Final   Special Requests IMMUNE:COMPROMISED   Final   Culture  Setup Time     Final   Value: 04/17/2014 18:58     Performed at [place]     Final   Value: 65,000 COLONIES/ML     Performed at [facility]   Culture     Final   Value: ENTEROCOCCUS SPECIES     Performed at [facility]   Report Status 04/20/2014 FINAL   Final   Organism ID, Bacteria ENTEROCOCCUS SPECIES   Final  URINE CULTURE     Status: None   Collection Time    04/19/14  9:58 PM      Result Value Ref Range Status   Specimen Description  URINE, CATHETERIZED   Final   Special Requests NONE   Final   Culture  Setup Time     Final   Value: 04/20/2014 13:56     Performed at [place]     Final   Value: NO GROWTH     Performed at [facility]   Culture     Final   Value: NO GROWTH     Performed at [facility]   Report Status 04/21/2014 FINAL   Final  MRSA PCR SCREENING     Status: None   Collection Time    04/20/14  6:23 AM      Result Value Ref Range Status   MRSA by PCR NEGATIVE  NEGATIVE Final   Comment:            The GeneXpert MRSA Assay (FDA     approved for NASAL specimens     only), is one component of a     comprehensive MRSA colonization     surveillance program. It is not     intended to diagnose MRSA     infection nor to guide or     monitor treatment for     MRSA infections.  CLOSTRIDIUM DIFFICILE BY PCR     Status: Abnormal   Collection Time    04/21/14  3:29 AM      Result Value Ref Range Status   C difficile by pcr POSITIVE (*) NEGATIVE Final   Comment: REPEATED TO VERIFY     HEATH,S AT 0712 BY  GODFREY,O ON 04/21/14     CRITICAL RESULT CALLED TO, READ BACK BY AND VERIFIED WITH:     HEATH,S AT 0712 BY GODFREY,O ON 04/21/14  BODY FLUID CULTURE     Status: None   Collection Time    04/23/14  2:20 PM      Result Value Ref Range Status   Specimen Description ASCITIC FLUID   Final   Special Requests NONE   Final   Gram Stain     Final   Value: NO WBC SEEN     NO ORGANISMS SEEN     Performed at [facility]   Culture PENDING   Incomplete   Report Status PENDING   Incomplete      Lab Results  Component Value Date   CALCIUM 7.5* 04/24/2014   PHOS 4.1 04/24/2014       Impression: 1)Renal  AKI secondary to Prerenal/ATN               AKI improving               Creat trending dwon                AKI on CKD               CKD sec to Multiple AKI              2)CVS- Hemodynamically stable  3)Anemia HGb  Stable   4)Liver- Cirrhosis Sec to Steato hepatitis S/p paracentesis  5)Hypokalemia Being repleted   6)GERD on PPI  7)Acid base Co2 at goal  8) ID- admitted with C diff colitis Now better  Plan:  Will contnue current tx. Will discuss with primary team and GI regarding starting spironolactone for  Ascitics. This should help with cirrhosis, hypokalemia and fluid status.     Janard Culp S 04/24/2014, 9:02 AM

## 2014-04-24 NOTE — Progress Notes (Signed)
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 She was found to be in acute renal failure with a creatinine greater than 4 as well as testing positive for Clostridium difficile. She was admitted to the step down unit due to low blood pressures related to sepsis. She was started on oral vancomycin as well as intravenous hydration. She was followed by nephrology. Her renal function has since improved. IV fluids have since been discontinued and she has been started on Demadex. With IV hydration, she developed worsening ascites and required paracentesis. At this point, she appears to be clinically improving although her blood pressure remains on the lower side. We will add midodrine. She does have a leukocytosis it is slowly trending up. This will need to be watched. She's not had any fevers. If her hemodynamics stabilize and her renal function continues to improve, then I anticipate that she should be able to discharge in the next one to 2 days  Assessment/Plan: 1. Clostridium difficile colitis. Patient started on oral vancomycin 9/10. She is also on florastor. Diarrhea is improving. Continue current treatments. Will plan on total of 14 days of antibiotics. 2. Sepsis related to #1. Blood pressure is improved with IV hydration. Continue antibiotics. She's been afebrile. Urine culture showed no growth 3. Acute renal failure. Possibly related to volume depletion and dehydration. Appreciate nephrology input. Creatinine was improving with IV hydration.  IV fluids were then discontinued and she was started on demadex. She has significant edema. Creatinine continues to improve. 4. Hypotension. Likely related to volume depletion and underlying cirrhosis. Patient appears to be  mentating appropriately. Blood pressure remains on lower side. She appears to be adequately hydrated. Will start midodrine. 5. Metabolic acidosis, likely related to diarrhea/sepsis. She's receiving IV fluids and is on oral bicarbonate replacement. Appears to be improving. 6. 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 Followed by GI as an outpatient. She underwent paracentesis on 9/14 with removal of 4.8L. Fluid analysis did not indicate any signs of infection. 7. Anemia, likely related to chronic disease as well as acute illness. Transfused 2 units PRBCs since admission. No evidence of gross bleeding. 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 Hemoglobin appears to be stable. Continue to follow. 8. Hypokalemia. replace    Code Status: full code Family Communication: discussed with patient Disposition Plan: discharge home once improved   Consultants:  Nephrology  Procedures:  Paracentesis on 9/14 with removal of 4831ml  Antibiotics:  Vancomycin 9/10>>  Rocephin 9/10>>9/12  HPI/Subjective: Diarrhea improving. Has some soreness in abdomen after paracentesis yesterday. Feels that legs are still "tight".  Objective: Filed Vitals:   04/24/14 1000  BP: 90/46  Pulse:   Temp:   Resp: 17    Intake/Output Summary (Last 24 hours) at 04/24/14 1058 Last data filed at 04/24/14 0645  Gross per 24 hour  Intake    150 ml  Output   1250 ml  Net  -1100 ml   Filed Weights   04/22/14 0500 04/23/14 0500 04/24/14 0500  Weight: 66.3 kg (146 lb 2.6 oz) 66.8 kg (147 lb 4.3 oz) 59.7 kg (131 lb 9.8 oz)    Exam:   General:  NAD, jaundiced  Cardiovascular: s1, s2, tachycardic  Respiratory: cta b  Abdomen: soft, abdominal distention improved from yesterday, non tender, bs+  Musculoskeletal: 1-2+ edema b/l  Data Reviewed: Basic Metabolic Panel:  Recent Labs Lab 04/20/14 0710 04/21/14 0537 04/22/14 0504 04/23/14 0449 04/24/14 0456  NA 134* 132* 134* 138 138  K 3.9 3.7 3.4* 3.2*  3.3*  CL 100 98 100 101 99  CO2 19 17* 17* 19 21  GLUCOSE 126* 112* 99 96 112*  BUN 41* 35* 29* 24* 24*  CREATININE 3.61* 3.03* 2.08* 1.68* 1.59*  CALCIUM 7.0* 6.9* 7.3* 7.4* 7.5*  MG 1.0*  --  1.6  --   --   PHOS 1.3* 2.3 2.8 3.8 4.1   Liver Function Tests:  Recent Labs Lab 04/17/14 1110 04/19/14 2049 04/20/14 0710 04/22/14 0504 04/24/14 0456  AST 130* 128* 121* 76* 59*  ALT 17 19 17 13 12   ALKPHOS 196* 185* 160* 135* 128*  BILITOT 10.9* 8.6* 8.3* 6.6* 8.1*  PROT 8.5* 7.9 7.2 6.7 6.3  ALBUMIN 2.5* 2.2* 1.9* 2.5* 2.2*    Recent Labs Lab 04/19/14 2049  LIPASE 79*    Recent Labs Lab 04/17/14 1143 04/19/14 2049  AMMONIA 60 69*   CBC:  Recent Labs Lab 04/17/14 1110 04/19/14 2049 04/20/14 0710 04/21/14 0537 04/22/14 0504 04/23/14 0449 04/24/14 0456  WBC 12.5* 14.3* 10.2 10.8* 9.3 10.6* 12.0*  NEUTROABS 9.7* 10.7*  --   --   --   --   --   HGB 7.5* 6.5* 8.4* 8.3* 7.7* 8.0* 8.2*  HCT 21.8* 18.7* 24.1* 23.9* 22.2* 23.2* 23.6*  MCV 97.3 97.9 91.6 93.4 91.7 92.1 92.9  PLT 262 254 201 202 179 173 157   Cardiac Enzymes: No results found for this basename: CKTOTAL, CKMB, CKMBINDEX, TROPONINI,  in the last 168 hours BNP (last 3 results) No results found for this basename: PROBNP,  in the last 8760 hours CBG: No results found for this basename: GLUCAP,  in the last 168 hours  Recent Results (from the past 240 hour(s))  URINE CULTURE     Status: None   Collection Time    04/17/14 12:12 PM      Result Value Ref Range Status   Specimen Description URINE, CLEAN CATCH   Final   Special Requests IMMUNE:COMPROMISED   Final   Culture  Setup Time     Final   Value: 04/17/2014 18:58     Performed at [place]     Final   Value: 65,000 COLONIES/ML     Performed at [facility]   Culture     Final   Value: ENTEROCOCCUS SPECIES     Performed at [facility]   Report Status 04/20/2014 FINAL   Final   Organism ID, Bacteria  ENTEROCOCCUS SPECIES   Final  URINE CULTURE     Status: None   Collection Time    04/19/14  9:58 PM      Result Value Ref Range Status   Specimen Description URINE, CATHETERIZED   Final   Special Requests NONE   Final   Culture  Setup Time     Final   Value: 04/20/2014 13:56     Performed at [place]     Final   Value: NO GROWTH     Performed at [facility]   Culture     Final   Value: NO GROWTH     Performed at [facility]   Report Status 04/21/2014 FINAL   Final  MRSA PCR SCREENING     Status: None  Collection Time    04/20/14  6:23 AM      Result Value Ref Range Status   MRSA by PCR NEGATIVE  NEGATIVE Final   Comment:            The GeneXpert MRSA Assay (FDA     approved for NASAL specimens     only), is one component of a     comprehensive MRSA colonization     surveillance program. It is not     intended to diagnose MRSA     infection nor to guide or     monitor treatment for     MRSA infections.  CLOSTRIDIUM DIFFICILE BY PCR     Status: Abnormal   Collection Time    04/21/14  3:29 AM      Result Value Ref Range Status   C difficile by pcr POSITIVE (*) NEGATIVE Final   Comment: REPEATED TO VERIFY     HEATH,S AT 0712 BY GODFREY,O ON 04/21/14     CRITICAL RESULT CALLED TO, READ BACK BY AND VERIFIED WITH:     HEATH,S AT 0712 BY GODFREY,O ON 04/21/14  BODY FLUID CULTURE     Status: None   Collection Time    04/23/14  2:20 PM      Result Value Ref Range Status   Specimen Description ASCITIC FLUID   Final   Special Requests NONE   Final   Gram Stain     Final   Value: NO WBC SEEN     NO ORGANISMS SEEN     Performed at [facility]   Culture PENDING   Incomplete   Report Status PENDING   Incomplete     Studies: US Paracentesis  04/23/2014   CLINICAL DATA:  Ascites.  EXAM: ULTRASOUND GUIDED PARACENTESIS  COMPARISON:  April 13, 2014.  PROCEDURE: An ultrasound guided paracentesis was thoroughly discussed with  the patient and questions answered. The benefits, risks, alternatives and complications were also discussed. The patient understands and wishes to proceed with the procedure. Written consent was obtained.  Ultrasound was performed to localize and mark an adequate pocket of fluid in the right lower quadrant of the abdomen. The area was then prepped and draped in the normal sterile fashion. 1% Lidocaine was used for local anesthesia. Under ultrasound guidance a paracentesis catheter was introduced. Paracentesis was performed. The catheter was removed and a dressing applied.  COMPLICATIONS: None immediate.  FINDINGS: A total of approximately 4800 mL of serous fluid was removed. A fluid sample was sent for laboratory analysis.  IMPRESSION: Successful ultrasound guided paracentesis yielding 4800 mL of ascites.   Electronically Signed   By: Sabino Dick M.D.   On: 04/23/2014 15:08    Scheduled Meds: . midodrine  5 mg Oral TID WC  . pantoprazole (PROTONIX) IV  40 mg Intravenous Q12H  . phosphorus  250 mg Oral Daily  . potassium chloride  40 mEq Oral BID  . saccharomyces boulardii  250 mg Oral BID  . sodium bicarbonate  1,300 mg Oral BID  . sodium chloride  3 mL Intravenous Q12H  . torsemide  40 mg Oral Daily  . vancomycin  125 mg Oral 4 times per day   Continuous Infusions:    Principal Problem:   Sepsis Active Problems:   Colitis   Ascites   Acute renal failure   Sinus tachycardia   Cirrhosis   Anemia   Hypokalemia   C. difficile diarrhea    Time spent: 24mins  [facility] Pager [phone]. If 7PM-7AM, please contact night-coverage at [email or web address], password [facility] 04/24/2014, 10:58 AM  LOS: 5 days

## 2014-04-25 ENCOUNTER — Inpatient Hospital Stay (HOSPITAL_COMMUNITY): Payer: Medicare Other

## 2014-04-25 ENCOUNTER — Inpatient Hospital Stay (HOSPITAL_COMMUNITY): Admit: 2014-04-25 | Payer: Self-pay

## 2014-04-25 ENCOUNTER — Encounter (HOSPITAL_COMMUNITY): Payer: Self-pay | Admitting: Internal Medicine

## 2014-04-25 DIAGNOSIS — E876 Hypokalemia: Secondary | ICD-10-CM

## 2014-04-25 LAB — PATHOLOGIST SMEAR REVIEW

## 2014-04-25 LAB — CBC
HCT: 25.2 % — ABNORMAL LOW (ref 36.0–46.0)
HEMOGLOBIN: 8.6 g/dL — AB (ref 12.0–15.0)
MCH: 32.3 pg (ref 26.0–34.0)
MCHC: 34.1 g/dL (ref 30.0–36.0)
MCV: 94.7 fL (ref 78.0–100.0)
Platelets: 169 10*3/uL (ref 150–400)
RBC: 2.66 MIL/uL — AB (ref 3.87–5.11)
RDW: 23.1 % — ABNORMAL HIGH (ref 11.5–15.5)
WBC: 13.6 10*3/uL — ABNORMAL HIGH (ref 4.0–10.5)

## 2014-04-25 LAB — BASIC METABOLIC PANEL
ANION GAP: 16 — AB (ref 5–15)
BUN: 29 mg/dL — ABNORMAL HIGH (ref 6–23)
CO2: 22 meq/L (ref 19–32)
Calcium: 7.9 mg/dL — ABNORMAL LOW (ref 8.4–10.5)
Chloride: 97 mEq/L (ref 96–112)
Creatinine, Ser: 2.12 mg/dL — ABNORMAL HIGH (ref 0.50–1.10)
GFR calc non Af Amer: 28 mL/min — ABNORMAL LOW (ref >90)
GFR, EST AFRICAN AMERICAN: 33 mL/min — AB (ref >90)
Glucose, Bld: 106 mg/dL — ABNORMAL HIGH (ref 70–99)
POTASSIUM: 4.1 meq/L (ref 3.7–5.3)
Sodium: 135 mEq/L — ABNORMAL LOW (ref 137–147)

## 2014-04-25 LAB — MAGNESIUM: Magnesium: 1 mg/dL — ABNORMAL LOW (ref 1.5–2.5)

## 2014-04-25 MED ORDER — POTASSIUM CHLORIDE CRYS ER 20 MEQ PO TBCR
20.0000 meq | EXTENDED_RELEASE_TABLET | Freq: Two times a day (BID) | ORAL | Status: DC
  Administered 2014-04-25 (×2): 20 meq via ORAL
  Filled 2014-04-25 (×2): qty 1

## 2014-04-25 MED ORDER — LORAZEPAM 0.5 MG PO TABS
0.2500 mg | ORAL_TABLET | Freq: Two times a day (BID) | ORAL | Status: DC | PRN
  Administered 2014-04-25 – 2014-04-27 (×4): 0.25 mg via ORAL
  Filled 2014-04-25 (×4): qty 1

## 2014-04-25 MED ORDER — LORATADINE 10 MG PO TABS
10.0000 mg | ORAL_TABLET | Freq: Every day | ORAL | Status: DC
  Administered 2014-04-25 – 2014-04-27 (×3): 10 mg via ORAL
  Filled 2014-04-25 (×3): qty 1

## 2014-04-25 MED ORDER — MAGNESIUM SULFATE 40 MG/ML IJ SOLN
2.0000 g | Freq: Once | INTRAMUSCULAR | Status: AC
Start: 2014-04-25 — End: 2014-04-25
  Administered 2014-04-25: 2 g via INTRAVENOUS
  Filled 2014-04-25: qty 50

## 2014-04-25 MED ORDER — PANTOPRAZOLE SODIUM 40 MG PO TBEC
40.0000 mg | DELAYED_RELEASE_TABLET | Freq: Two times a day (BID) | ORAL | Status: DC
  Administered 2014-04-25 – 2014-04-27 (×4): 40 mg via ORAL
  Filled 2014-04-25 (×5): qty 1

## 2014-04-25 MED ORDER — MAGNESIUM SULFATE 50 % IJ SOLN: 2.0000 g | Freq: Once | INTRAVENOUS | Status: DC

## 2014-04-25 MED ORDER — SALINE SPRAY 0.65 % NA SOLN: 1.0000 | NASAL | Status: DC | PRN

## 2014-04-25 MED ORDER — TORSEMIDE 20 MG PO TABS
30.0000 mg | ORAL_TABLET | Freq: Every day | ORAL | Status: DC
  Administered 2014-04-26 – 2014-04-27 (×2): 30 mg via ORAL
  Filled 2014-04-25 (×2): qty 2

## 2014-04-25 MED ORDER — SODIUM BICARBONATE 650 MG PO TABS
650.0000 mg | ORAL_TABLET | Freq: Two times a day (BID) | ORAL | Status: DC
  Administered 2014-04-25 – 2014-04-27 (×5): 650 mg via ORAL
  Filled 2014-04-25 (×5): qty 1

## 2014-04-25 MED ORDER — MIDODRINE HCL 5 MG PO TABS
10.0000 mg | ORAL_TABLET | Freq: Three times a day (TID) | ORAL | Status: DC
  Administered 2014-04-25 – 2014-04-27 (×6): 10 mg via ORAL
  Filled 2014-04-25 (×6): qty 2

## 2014-04-25 NOTE — Progress Notes (Signed)
Notified MD that patient is requesting Aldactone 25mg  that she takes at home for swelling.  Will continue to monitor.

## 2014-04-25 NOTE — Progress Notes (Signed)
Wanda [name]  MRN: [id number]  DOB/AGE: August 31, 1975 38 y.o.  Primary Care Physician:Default, Provider, MD  Admit date: 04/19/2014  Chief Complaint:  Chief Complaint  Patient presents with  . Weakness    S-Pt presented on  04/19/2014 with  Chief Complaint  Patient presents with  . Weakness  .    Pt today feels better.    Pt offers no new complaints.    Meds . loratadine  10 mg Oral Daily  . midodrine  5 mg Oral TID WC  . pantoprazole  40 mg Oral BID AC  . phosphorus  250 mg Oral Daily  . potassium chloride  20 mEq Oral BID  . saccharomyces boulardii  250 mg Oral BID  . sodium bicarbonate  650 mg Oral BID  . sodium chloride  3 mL Intravenous Q12H  . torsemide  40 mg Oral Daily  . vancomycin  125 mg Oral 4 times per day     Physical Exam: Vital signs in last 24 hours: Temp:  [97.2 F (36.2 C)-98.4 F (36.9 C)] 97.2 F (36.2 C) (09/16 0803) Resp:  [10-21] 14 (09/16 1000) BP: (83-107)/(36-58) 92/43 mmHg (09/16 1000) SpO2:  [97 %-100 %] 98 % (09/16 0530) Weight:  [136 lb 0.4 oz (61.7 kg)] 136 lb 0.4 oz (61.7 kg) (09/16 0500) Weight change: 4 lb 6.6 oz (2 kg) Last BM Date: 04/25/14  Intake/Output from previous day: 09/15 0701 - 09/16 0700 In: 430 [P.O.:420; I.V.:10] Out: 500 [Urine:500] Total I/O In: 360 [P.O.:360] Out: -    Physical Exam: General- pt is awake,alert, oriented to time place and person Resp- No acute REsp distress, CTA B/L NO Rhonchi CVS- S1S2 regular in rate and rhythm GIT- BS+, soft, distended. EXT- 1+ LE Edema( better),NO Cyanosis   Lab Results: CBC  Recent Labs  04/24/14 0456 04/25/14 0830  WBC 12.0* 13.6*  HGB 8.2* 8.6*  HCT 23.6* 25.2*  PLT 157 169    BMET  Recent Labs  04/24/14 0456 04/25/14 0830  NA 138 135*  K 3.3* 4.1  CL 99 97  CO2 21 22  GLUCOSE 112* 106*  BUN 24* 29*  CREATININE 1.59* 2.12*  CALCIUM 7.5* 7.9*   Trend Creat 2015  2.15=>4.13=>1.68=>1.59=>2.12           1.15=>1.95=>1.19 ( July  admission)           2.3==>1.21=> 1.08 ( April admission)           3.11==>1.36==>1.18 ( February admission)  MICRO Recent Results (from the past 240 hour(s))  URINE CULTURE     Status: None   Collection Time    04/17/14 12:12 PM      Result Value Ref Range Status   Specimen Description URINE, CLEAN CATCH   Final   Special Requests IMMUNE:COMPROMISED   Final   Culture  Setup Time     Final   Value: 04/17/2014 18:58     Performed at [place]     Final   Value: 65,000 COLONIES/ML     Performed at [facility]   Culture     Final   Value: ENTEROCOCCUS SPECIES     Performed at [facility]   Report Status 04/20/2014 FINAL   Final   Organism ID, Bacteria ENTEROCOCCUS SPECIES   Final  URINE CULTURE     Status: None   Collection Time    04/19/14  9:58 PM      Result Value Ref  Range Status   Specimen Description URINE, CATHETERIZED   Final   Special Requests NONE   Final   Culture  Setup Time     Final   Value: 04/20/2014 13:56     Performed at [place]     Final   Value: NO GROWTH     Performed at [facility]   Culture     Final   Value: NO GROWTH     Performed at [facility]   Report Status 04/21/2014 FINAL   Final  MRSA PCR SCREENING     Status: None   Collection Time    04/20/14  6:23 AM      Result Value Ref Range Status   MRSA by PCR NEGATIVE  NEGATIVE Final   Comment:            The GeneXpert MRSA Assay (FDA     approved for NASAL specimens     only), is one component of a     comprehensive MRSA colonization     surveillance program. It is not     intended to diagnose MRSA     infection nor to guide or     monitor treatment for     MRSA infections.  CLOSTRIDIUM DIFFICILE BY PCR     Status: Abnormal   Collection Time    04/21/14  3:29 AM      Result Value Ref Range Status   C difficile by pcr POSITIVE (*) NEGATIVE Final   Comment: REPEATED TO VERIFY     HEATH,S AT 0712 BY  GODFREY,O ON 04/21/14     CRITICAL RESULT CALLED TO, READ BACK BY AND VERIFIED WITH:     HEATH,S AT 0712 BY GODFREY,O ON 04/21/14  BODY FLUID CULTURE     Status: None   Collection Time    04/23/14  2:20 PM      Result Value Ref Range Status   Specimen Description ASCITIC FLUID   Final   Special Requests NONE   Final   Gram Stain     Final   Value: NO WBC SEEN     NO ORGANISMS SEEN     Performed at [facility]   Culture     Final   Value: NO GROWTH 1 DAY     Performed at [facility]   Report Status PENDING   Incomplete      Lab Results  Component Value Date   CALCIUM 7.9* 04/25/2014   PHOS 4.1 04/24/2014       Impression: 1)Renal  AKI secondary to Prerenal/ATN               AKI was improving               Creat increased today                Most likely sec to Hypotension               AKI on CKD               CKD sec to Multiple AKI              2)CVS- Hemodynamically fragile  3)Anemia HGb  Stable   4)Liver- Cirrhosis Sec to Steato hepatitis S/p paracentesis  5)Hypokalemia Being repleted   6)GERD on PPI  7)Acid base Co2 at goal  8) ID- admitted with C diff colitis Now better  Plan:  Will increase Midodrine dose to 10mg  po TID. Will reduce torsemide to 30mg  . If BP better in am , may suggest to add spironolactone .    BHUTANI,MANPREET S 04/25/2014, 3:29 PM

## 2014-04-25 NOTE — Progress Notes (Signed)
PT TRANSFERRING  TO ROOM 303. PT ALERT AND ORIENTED. LUNGS CLEAR. SKIN IS JAUNDICE.  HR 78 IN NSL. PT ABLE TO AMBULATE W/ ROLLING WALKER. NSL IN RTFA PATENT. TRANSFER REPORT GIVEN TO LINDSEY RN ON 300.

## 2014-04-25 NOTE — Care Management Note (Addendum)
    Page 1 of 1   04/27/2014     1:58:11 PM CARE MANAGEMENT NOTE 04/27/2014  Patient:  Wanda [name], Wanda [name]   Account Number:  [id number]  Date Initiated:  04/25/2014  Documentation initiated by:  Theophilus Kinds  Subjective/Objective Assessment:   Pt admitted from home with sepsis and renal failure. Pt lives with her significant other and will return home at discharge. Pt has a walker and BSC for home use. Pt PCP is with [facility].     Action/Plan:   Will continue to follow for discharge planning needs.   Anticipated DC Date:  04/28/2014   Anticipated DC Plan:  [place]  CM consult      Choice offered to / List presented to:             Status of service:  Completed, signed off Medicare Important Message given?  YES (If response is "NO", the following Medicare IM given date fields will be blank) Date Medicare IM given:  04/27/2014 Medicare IM given by:  Theophilus Kinds Date Additional Medicare IM given:   Additional Medicare IM given by:    Discharge Disposition:  HOME/SELF CARE  Per UR Regulation:    If discussed at Long Length of Stay Meetings, dates discussed:   04/26/2014    Comments:  04/27/14 [place], RN BSN CM Pt discharged home today. Pt given a script for lab work on 04/30/14 and instructions for lab to call results to Dr. Caryn Section. Pt refuses any home health. No DME needs noted. Pt and pts nurse aware of discharge arrangements.  04/25/14 [place], RN BSN CM

## 2014-04-25 NOTE — Progress Notes (Signed)
UR chart review completed.  

## 2014-04-25 NOTE — Progress Notes (Signed)
The patient is receiving Protonix by the intravenous route.  Based on criteria approved by the Pharmacy and [place], the medication is being converted to the equivalent oral dose form.  These criteria include: -No Active GI bleeding -Able to tolerate diet of full liquids (or better) or tube feeding OR able to tolerate other medications by the oral or enteral route  If you have any questions about this conversion, please contact the Pharmacy Department (ext [phone]).  Thank you.  Biagio Borg, [facility] 04/25/2014 11:46 AM

## 2014-04-25 NOTE — Progress Notes (Signed)
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 She was found to be in acute renal failure with a creatinine greater than 4 as well as testing positive for Clostridium difficile. She was admitted to the step down unit due to low blood pressures related to sepsis. She was started on oral vancomycin as well as intravenous hydration. She was followed by nephrology. Her renal function has since improved. IV fluids have since been discontinued and she has been started on Demadex. With IV hydration, she developed worsening ascites and required paracentesis. At this point, she appears to be clinically improving although her blood pressure remains on the lower side. We will add midodrine. She does have a leukocytosis it is slowly trending up. This will need to be watched. She's not had any fevers. If her hemodynamics stabilize and her renal function continues to improve.  Assessment/Plan: 1. Clostridium difficile colitis. Patient started on oral vancomycin 9/10. She is also on florastor. Diarrhea is resolved. Continue current treatments. Will plan on total of 14 days of oral vancomycin treatment. 2. Sepsis related to #1. Blood pressure has improved with IV hydration. Her white blood cell count is trending up, but she is afebrile. We'll continue vancomycin. Rocephin discontinued for a possible urinary tract infection after 2 days of treatment. The urine culture revealed no growth.  3. Leukocytosis. Her white blood cell count has trended upward. Urine culture revealed no growth on 9/10, but her urine culture on 9/8 did reveal some enterococcus. We'll order another urinalysis for clarification. If grossly positive, will start Levaquin or vancomycin IV. Ascitic fluid drawn on  04/23/14 revealed no WBCs and no organisms seen so far. She does complain of a cough and nasal congestion which she attributes to seasonal allergies. We'll order a chest x-ray for further evaluation. 4. Acute renal failure. Possibly related to volume depletion and dehydration. Her creatinine in July was 1.19, following treatment for upper GI bleeding. Her creatinine has improved overall, but not yet back to baseline; but we are awaiting the creatinine for this morning. Nephrology was consulted. She was hydrated with IV fluids for several days. IV fluids were eventually discontinued and she was restarted on torsemide for generalized anasarca from cirrhosis.[name]   5. Hypotension. Likely related to volume depletion and underlying cirrhosis. Patient appears to be mentating appropriately. Blood pressure remains on lower side. She appears to be adequately hydrated. Midodrine was started. This will be titrated up or down accordingly. 6. Metabolic acidosis, secondary to diarrhea/sepsis. With hydration and the start of sodium bicarbonate, her CO2 has improved. We'll continue to monitor. The dose of sodium bicarbonate could possibly be decreased. We'll await the followup basic metabolic panel results. 7. 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 Followed by GI as an outpatient. She underwent paracentesis on 9/14 with removal of 4.8L. Fluid analysis did not indicate any signs of infection. 8. Generalized anasarca secondary to cirrhosis. Torsemide has been restarted. 9. Anemia- acute on chronic, likely related to chronic disease as well as acute illness, but could still have chronic slow GI bleeding. The patient denies hematemesis. No obvious evidence of gross GI bleeding currently. She is status post banding of bleeding esophageal varices. Her hemoglobin was 6.5 on admission. She was ransfused 2 units PRBCs. Her hemoglobin has been stable since the transfusion. 10. Hypokalemia secondary to  diarrhea and restart torsemide.  Continue to replace. We'll order a magnesium level to rule out deficiency. 11. Seasonal allergies. Will start Claritin and saline nasal spray. 12. Anxiousness. We'll start when necessary Ativan. 13. Will encourage the patient to ambulate in the room with assistance and to be out of bed to the chair for several hours.   Code Status: full code Family Communication: discussed with patient Disposition Plan: discharge home once improved   Consultants:  Nephrology  Procedures:  Paracentesis on 9/14 with removal of 4821ml  Antibiotics:  Vancomycin 9/10>>  Rocephin 9/10>>9/12  HPI/Subjective: Patient states that the diarrhea has slowed a lot. She complains of some anxiousness. She also has seasonal allergies with a cough and nasal congestion. She denies subjective fever. She denies pleurisy.  Objective: Filed Vitals:   04/25/14 0803  BP:   Pulse:   Temp: 97.2 F (36.2 C)  Resp:    temperature 97.2. Respiratory rate 11. Blood pressure 92/50. Oxygen saturation 98% on room air.  Intake/Output Summary (Last 24 hours) at 04/25/14 0853 Last data filed at 04/25/14 0700  Gross per 24 hour  Intake    430 ml  Output    500 ml  Net    -70 ml   Filed Weights   04/23/14 0500 04/24/14 0500 04/25/14 0500  Weight: 66.8 kg (147 lb 4.3 oz) 59.7 kg (131 lb 9.8 oz) 61.7 kg (136 lb 0.4 oz)    Exam:   General: Ill-appearing 38 year old woman in no acute distress.  Skin: Grossly jaundiced.  Nasal mucosa moist with mild rhinorrhea.  Cardiovascular: S1, S2, with no murmurs rubs or gallops.  Respiratory: Clear to auscultation bilaterally with decreased breath sounds in the bases.  Abdomen: soft, positive bowel sounds, mild to moderate fluid wave, mild generalized tenderness.  Musculoskeletal: 1 plus bilateral lower extremity edema.  Neuropsychiatric: She is alert and oriented x3. She is occasionally tearful and slightly anxious.  Data Reviewed: Basic Metabolic Panel:  Recent  Labs Lab 04/20/14 0710 04/21/14 0537 04/22/14 0504 04/23/14 0449 04/24/14 0456  NA 134* 132* 134* 138 138  K 3.9 3.7 3.4* 3.2* 3.3*  CL 100 98 100 101 99  CO2 19 17* 17* 19 21  GLUCOSE 126* 112* 99 96 112*  BUN 41* 35* 29* 24* 24*  CREATININE 3.61* 3.03* 2.08* 1.68* 1.59*  CALCIUM 7.0* 6.9* 7.3* 7.4* 7.5*  MG 1.0*  --  1.6  --   --   PHOS 1.3* 2.3 2.8 3.8 4.1   Liver Function Tests:  Recent Labs Lab 04/19/14 2049 04/20/14 0710 04/22/14 0504 04/24/14 0456  AST 128* 121* 76* 59*  ALT 19 17 13 12   ALKPHOS 185* 160* 135* 128*  BILITOT 8.6* 8.3* 6.6* 8.1*  PROT 7.9 7.2 6.7 6.3  ALBUMIN 2.2* 1.9* 2.5* 2.2*    Recent Labs Lab 04/19/14 2049  LIPASE 79*    Recent Labs Lab 04/19/14 2049  AMMONIA 69*   CBC:  Recent Labs Lab 04/19/14 2049  04/21/14 0537 04/22/14 0504 04/23/14 0449 04/24/14 0456 04/25/14 0830  WBC 14.3*  < > 10.8* 9.3 10.6* 12.0* 13.6*  NEUTROABS 10.7*  --   --   --   --   --   --   HGB 6.5*  < > 8.3* 7.7* 8.0* 8.2* 8.6*  HCT 18.7*  < > 23.9* 22.2* 23.2* 23.6* 25.2*  MCV 97.9  < > 93.4 91.7 92.1 92.9 94.7  PLT 254  < > 202 179 173 157 169  < > =  values in this interval not displayed. Cardiac Enzymes: No results found for this basename: CKTOTAL, CKMB, CKMBINDEX, TROPONINI,  in the last 168 hours BNP (last 3 results) No results found for this basename: PROBNP,  in the last 8760 hours CBG: No results found for this basename: GLUCAP,  in the last 168 hours  Recent Results (from the past 240 hour(s))  URINE CULTURE     Status: None   Collection Time    04/17/14 12:12 PM      Result Value Ref Range Status   Specimen Description URINE, CLEAN CATCH   Final   Special Requests IMMUNE:COMPROMISED   Final   Culture  Setup Time     Final   Value: 04/17/2014 18:58     Performed at [place]     Final   Value: 65,000 COLONIES/ML     Performed at [facility]   Culture     Final   Value: ENTEROCOCCUS SPECIES      Performed at [facility]   Report Status 04/20/2014 FINAL   Final   Organism ID, Bacteria ENTEROCOCCUS SPECIES   Final  URINE CULTURE     Status: None   Collection Time    04/19/14  9:58 PM      Result Value Ref Range Status   Specimen Description URINE, CATHETERIZED   Final   Special Requests NONE   Final   Culture  Setup Time     Final   Value: 04/20/2014 13:56     Performed at [place]     Final   Value: NO GROWTH     Performed at [facility]   Culture     Final   Value: NO GROWTH     Performed at [facility]   Report Status 04/21/2014 FINAL   Final  MRSA PCR SCREENING     Status: None   Collection Time    04/20/14  6:23 AM      Result Value Ref Range Status   MRSA by PCR NEGATIVE  NEGATIVE Final   Comment:            The GeneXpert MRSA Assay (FDA     approved for NASAL specimens     only), is one component of a     comprehensive MRSA colonization     surveillance program. It is not     intended to diagnose MRSA     infection nor to guide or     monitor treatment for     MRSA infections.  CLOSTRIDIUM DIFFICILE BY PCR     Status: Abnormal   Collection Time    04/21/14  3:29 AM      Result Value Ref Range Status   C difficile by pcr POSITIVE (*) NEGATIVE Final   Comment: REPEATED TO VERIFY     HEATH,S AT 0712 BY GODFREY,O ON 04/21/14     CRITICAL RESULT CALLED TO, READ BACK BY AND VERIFIED WITH:     HEATH,S AT 0712 BY GODFREY,O ON 04/21/14  BODY FLUID CULTURE     Status: None   Collection Time    04/23/14  2:20 PM      Result Value Ref Range Status   Specimen Description ASCITIC FLUID   Final   Special Requests NONE   Final   Gram Stain     Final   Value: NO WBC SEEN  NO ORGANISMS SEEN     Performed at [facility]   Culture PENDING   Incomplete   Report Status PENDING   Incomplete     Studies: US Paracentesis  04/23/2014   CLINICAL DATA:  Ascites.  EXAM: ULTRASOUND GUIDED PARACENTESIS   COMPARISON:  April 13, 2014.  PROCEDURE: An ultrasound guided paracentesis was thoroughly discussed with the patient and questions answered. The benefits, risks, alternatives and complications were also discussed. The patient understands and wishes to proceed with the procedure. Written consent was obtained.  Ultrasound was performed to localize and mark an adequate pocket of fluid in the right lower quadrant of the abdomen. The area was then prepped and draped in the normal sterile fashion. 1% Lidocaine was used for local anesthesia. Under ultrasound guidance a paracentesis catheter was introduced. Paracentesis was performed. The catheter was removed and a dressing applied.  COMPLICATIONS: None immediate.  FINDINGS: A total of approximately 4800 mL of serous fluid was removed. A fluid sample was sent for laboratory analysis.  IMPRESSION: Successful ultrasound guided paracentesis yielding 4800 mL of ascites.   Electronically Signed   By: Sabino Dick M.D.   On: 04/23/2014 15:08    Scheduled Meds: . midodrine  5 mg Oral TID WC  . pantoprazole (PROTONIX) IV  40 mg Intravenous Q12H  . phosphorus  250 mg Oral Daily  . potassium chloride  40 mEq Oral BID  . saccharomyces boulardii  250 mg Oral BID  . sodium bicarbonate  1,300 mg Oral BID  . sodium chloride  3 mL Intravenous Q12H  . torsemide  40 mg Oral Daily  . vancomycin  125 mg Oral 4 times per day   Continuous Infusions:    Principal Problem:   Sepsis Active Problems:   C. difficile colitis   Acute renal failure   Ascites   Sinus tachycardia   Cirrhosis   Anemia   Hypokalemia    Time spent: 74mins    Chantea Surace  [facility] Pager [phone]. If 7PM-7AM, please contact night-coverage at [email or web address], password [facility] 04/25/2014, 8:53 AM  LOS: 6 days

## 2014-04-26 DIAGNOSIS — J209 Acute bronchitis, unspecified: Secondary | ICD-10-CM | POA: Diagnosis not present

## 2014-04-26 LAB — BASIC METABOLIC PANEL
Anion gap: 16 — ABNORMAL HIGH (ref 5–15)
BUN: 34 mg/dL — ABNORMAL HIGH (ref 6–23)
CHLORIDE: 96 meq/L (ref 96–112)
CO2: 22 mEq/L (ref 19–32)
CREATININE: 2.44 mg/dL — AB (ref 0.50–1.10)
Calcium: 8.6 mg/dL (ref 8.4–10.5)
GFR calc non Af Amer: 24 mL/min — ABNORMAL LOW (ref >90)
GFR, EST AFRICAN AMERICAN: 28 mL/min — AB (ref >90)
Glucose, Bld: 94 mg/dL (ref 70–99)
POTASSIUM: 4.6 meq/L (ref 3.7–5.3)
Sodium: 134 mEq/L — ABNORMAL LOW (ref 137–147)

## 2014-04-26 LAB — URINALYSIS, ROUTINE W REFLEX MICROSCOPIC
Glucose, UA: NEGATIVE mg/dL
Hgb urine dipstick: NEGATIVE
Ketones, ur: NEGATIVE mg/dL
LEUKOCYTES UA: NEGATIVE
NITRITE: NEGATIVE
PH: 5 (ref 5.0–8.0)
Protein, ur: NEGATIVE mg/dL
SPECIFIC GRAVITY, URINE: 1.015 (ref 1.005–1.030)
Urobilinogen, UA: 0.2 mg/dL (ref 0.0–1.0)

## 2014-04-26 MED ORDER — AZITHROMYCIN 250 MG PO TABS
500.0000 mg | ORAL_TABLET | Freq: Every day | ORAL | Status: AC
  Administered 2014-04-26: 500 mg via ORAL
  Filled 2014-04-26: qty 2

## 2014-04-26 MED ORDER — SODIUM CHLORIDE 0.9 % IV SOLN
INTRAVENOUS | Status: DC
  Administered 2014-04-26: 11:00:00 via INTRAVENOUS

## 2014-04-26 MED ORDER — ALBUTEROL SULFATE (2.5 MG/3ML) 0.083% IN NEBU
2.5000 mg | INHALATION_SOLUTION | Freq: Three times a day (TID) | RESPIRATORY_TRACT | Status: DC
  Administered 2014-04-26: 2.5 mg via RESPIRATORY_TRACT
  Filled 2014-04-26: qty 3

## 2014-04-26 MED ORDER — PREDNISONE 20 MG PO TABS
20.0000 mg | ORAL_TABLET | Freq: Every day | ORAL | Status: DC
  Administered 2014-04-26 – 2014-04-27 (×2): 20 mg via ORAL
  Filled 2014-04-26 (×2): qty 1

## 2014-04-26 MED ORDER — POTASSIUM CHLORIDE CRYS ER 20 MEQ PO TBCR
20.0000 meq | EXTENDED_RELEASE_TABLET | Freq: Every day | ORAL | Status: DC
  Administered 2014-04-26: 20 meq via ORAL
  Filled 2014-04-26: qty 1

## 2014-04-26 MED ORDER — NYSTATIN 100000 UNIT/GM EX CREA
TOPICAL_CREAM | Freq: Three times a day (TID) | CUTANEOUS | Status: DC
  Administered 2014-04-26: 21:00:00 via TOPICAL
  Administered 2014-04-26: 1 via TOPICAL
  Administered 2014-04-26 – 2014-04-27 (×2): via TOPICAL
  Filled 2014-04-26: qty 15

## 2014-04-26 MED ORDER — MAGNESIUM OXIDE 400 (241.3 MG) MG PO TABS
400.0000 mg | ORAL_TABLET | Freq: Two times a day (BID) | ORAL | Status: DC
  Administered 2014-04-26 – 2014-04-27 (×3): 400 mg via ORAL
  Filled 2014-04-26 (×3): qty 1

## 2014-04-26 MED ORDER — BENZONATATE 100 MG PO CAPS
100.0000 mg | ORAL_CAPSULE | Freq: Three times a day (TID) | ORAL | Status: DC
  Administered 2014-04-26 – 2014-04-27 (×4): 100 mg via ORAL
  Filled 2014-04-26 (×5): qty 1

## 2014-04-26 MED ORDER — ALBUTEROL SULFATE (2.5 MG/3ML) 0.083% IN NEBU: 2.5000 mg | INHALATION_SOLUTION | Freq: Four times a day (QID) | RESPIRATORY_TRACT | Status: DC | PRN

## 2014-04-26 MED ORDER — AZITHROMYCIN 250 MG PO TABS
250.0000 mg | ORAL_TABLET | Freq: Every day | ORAL | Status: DC
  Administered 2014-04-27: 250 mg via ORAL
  Filled 2014-04-26: qty 1

## 2014-04-26 MED ORDER — SPIRONOLACTONE 25 MG PO TABS
25.0000 mg | ORAL_TABLET | Freq: Every day | ORAL | Status: DC
  Administered 2014-04-26: 25 mg via ORAL
  Filled 2014-04-26: qty 1

## 2014-04-26 NOTE — Progress Notes (Signed)
Pt BP was 75/34, assessed patient and she is asymptomatic. This is not new for the patient, paged the on call physician, no new orders given at this time.

## 2014-04-26 NOTE — Progress Notes (Signed)
UR chart review completed.  

## 2014-04-26 NOTE — Progress Notes (Signed)
Patient up ambulating in room. Sitting up in chair. Tolerating well. No complaints voiced at this time.

## 2014-04-26 NOTE — Progress Notes (Signed)
Subjective: Interval History: Diarrhea is better > Denies any difficulty in breathing. She complains of increase in leg swelling Objective: Vital signs in last 24 hours: Temp:  [97.2 F (36.2 C)-97.9 F (36.6 C)] 97.9 F (36.6 C) (09/16 2143) Pulse Rate:  [73-92] 74 (09/17 0609) Resp:  [10-20] 20 (09/17 0609) BP: (75-92)/(34-58) 75/34 mmHg (09/17 0609) SpO2:  [99 %-100 %] 100 % (09/17 0609) Weight change:   Intake/Output from previous day: 09/16 0701 - 09/17 0700 In: 650 [P.O.:600; IV Piggyback:50] Out: -  Intake/Output this shift:    Generally patient is alert and in no apparent distress No elevated JVD Chest is clear Heart: RRR no murumer Abdomen : Distended but none tender Extremities: 2+ edema   Lab Results:  Recent Labs  04/24/14 0456 04/25/14 0830  WBC 12.0* 13.6*  HGB 8.2* 8.6*  HCT 23.6* 25.2*  PLT 157 169   BMET:   Recent Labs  04/25/14 0830 04/26/14 0550  NA 135* 134*  K 4.1 4.6  CL 97 96  CO2 22 22  GLUCOSE 106* 94  BUN 29* 34*  CREATININE 2.12* 2.44*  CALCIUM 7.9* 8.6   No results found for this basename: PTH,  in the last 72 hours Iron Studies: No results found for this basename: IRON, TIBC, TRANSFERRIN, FERRITIN,  in the last 72 hours  Studies/Results: Dg Chest Port 1 View  04/25/2014   CLINICAL DATA:  Edema, possible pneumonia  EXAM: PORTABLE CHEST - 1 VIEW  COMPARISON:  04/19/2014  FINDINGS: Cardiomediastinal silhouette is stable. No acute infiltrate or pleural effusion. No pulmonary edema. Bony thorax is unremarkable.  IMPRESSION: No active disease.   Electronically Signed   By: Lahoma Crocker M.D.   On: 04/25/2014 10:05    I have reviewed the patient's current medications.  Assessment/Plan: Problem #1 acute kidney injury superimposed on chronic. Her renal function shows slight increase but over all stable Problem #2 anemia: Her hemoglobin and hematocrit is below her target range . It is stable Problem #3 metabolic bone disease: Her  calcium and her phosphorus in range. Patient Presently on K phos neutral 1 table po once a day Problem #4 C. difficile colitis has improved. She had one episode of diarrhea yesterday. Problem #5 liver cirrhosis Problem #6 bipolar disorder Problem #7 anasarca: Patient is ascites: patient started to have increasing edema . She is on Demadex and dose has been reduced because of hypotension and her urine out put has declined Problem #8 metabolic acidosis:She is on sodium bicarbonate her CO2 is still low but improving Problem #9 hypokalemia: Patient on potassium supplement her potassium has corrected Problem#10 Hypotesion: Her blood pressure remains low . Presently on Midodrine Plan: D/cK phos neutral We'll continue with Demadex  We will decrease  her  Kcl 72meq po once aday Start on Aldactone 25 mg po once a day Magnesium oxide 400 mg po bid We'll check her basic metabolic panel, magnesium and phosphorus in the morning.     LOS: 7 days   Bassel Gaskill S 04/26/2014,7:50 AM

## 2014-04-26 NOTE — Progress Notes (Signed)
Patient complaining that her legs are hurting more than usual and starting to feel really tight. Paging the on-call MD, will follow any orders given and continue to monitor.

## 2014-04-26 NOTE — Progress Notes (Signed)
[facility] PROGRESS NOTE  Wanda [name] LFY:[id number] DOB: 1976-07-22 DOA: 04/19/2014 PCP: Default, Provider, MD    Assessment/Plan: 1. Clostridium difficile colitis. Patient started on oral vancomycin 9/10. She is also on florastor. Diarrhea has resolved. Continue current treatments. Will plan on total of 14 days of oral vancomycin treatment. 2. Sepsis related to #1. Blood pressure has waxed and waned between low and low-normal. This is now mostly associated with cirrhosis. Her white blood cell count is trending up, but she is afebrile. We'll continue vancomycin. Rocephin discontinued for a possible urinary tract infection after 2 days of treatment. The urine culture revealed no growth. Another urinalysis was ordered and is pending. 3. Leukocytosis. Her white blood cell count has trended upward. Urine culture revealed no growth on 9/10, but her urine culture on 9/8 did reveal some enterococcus. We'll order another urinalysis for clarification. If grossly positive, will start Levaquin or vancomycin IV. Ascitic fluid drawn on 04/23/14 revealed no WBCs and no organisms seen so far. She does complain of a cough and nasal congestion which she attributes to seasonal allergies. Her chest x-ray revealed no infiltrate, but her symptomatology is consistent with acute bronchitis. 4. Probable acute bronchitis. Her chest x-ray on 9/16 revealed no infiltrate or edema. However, her symptomatology is consistent with acute bronchitis. Will therefore start azithromycin, low-dose prednisone, nebulizers, and Tessalon Perles. 5. Acute renal failure. Possibly related to volume depletion and dehydration. Her creatinine in July was 1.19, following treatment for upper GI bleeding. Her creatinine has improved overall, but not yet back to baseline. In fact, over the past 24 hours, it has trended up.  Nephrology was consulted. She was hydrated with IV fluids for several days. IV fluids were eventually discontinued and she  was restarted on torsemide for generalized anasarca from cirrhosis. The dose was decreased from 40 mg to 30 mg on 9/16. Aldactone restarted on 9/16 by nephrology. Will await nephrology's followup recommendation, but in the meantime, we'll start gentle normal saline.   6. Hypotension. Likely related to volume depletion and underlying cirrhosis. Patient appears to be mentating appropriately. Blood pressure remains on lower side. She appears to be adequately hydrated. Midodrine was started and titrated up to 10 mg 3 times a day.  7. Metabolic acidosis, secondary to diarrhea/sepsis. With hydration and the start of sodium bicarbonate, her CO2 has improved. We'll continue to monitor. The dose of sodium bicarbonate was decreased to 650 mg twice a day on 9/16. It could possibly be discontinued in the next day or 2 after diarrhea has resolved. 8. 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 Followed by GI as an outpatient. She underwent paracentesis on 9/14 with removal of 4.8L. Fluid analysis did not indicate any signs of infection. She may need another paracentesis before discharge. 9. Generalized anasarca secondary to cirrhosis. Torsemide was restarted. Aldactone was restarted on 9/16. 10. Anemia- acute on chronic, likely related to chronic disease as well as acute illness, but could still have chronic slow GI bleeding. The patient denies hematemesis. No obvious evidence of gross GI bleeding currently. She is status post banding of bleeding esophageal varices. Her hemoglobin was 6.5 on admission. She was ransfused 2 units PRBCs. Her hemoglobin has been stable since the transfusion. 11. Hypokalemia secondary to diarrhea and restart torsemide. And now hypomagnesemia. Her potassium level is better. We'll stop supplementation in light of the restart of Aldactone. She did receive 2 g of magnesium sulfate on 9/16. 12. Hypomagnesemia. She was given 2 g of magnesium sulfate on 9/16. 13. Seasonal  allergies. Claritin and saline  nasal spray were started. 14. Anxiousness. We'll continue when necessary Ativan. 15. Will encourage the patient to ambulate in the room with assistance and to be out of bed to the chair for several hours.   Code Status: full code Family Communication: discussed with patient Disposition Plan: discharge home once improved   Consultants:  Nephrology  Procedures:  Paracentesis on 9/14 with removal of 4836ml  Antibiotics:  Oral azithromycin, 9/17>>  Vancomycin 9/10>>  Rocephin 9/10>>9/12  HPI/Subjective: Patient states her last bowel movement was yesterday afternoon. She denies abdominal pain. She complains of some abdominal swelling and feels that her abdomen is feeling back up with fluid. The Claritin has helped with nasal congestion, but she has a rattly cough.  Objective: Filed Vitals:   04/26/14 1027  BP: 105/49  Pulse: 81  Temp:   Resp:    temperature 97.9. Respiratory rate 20. Oxygen saturation 100% on room air.  Intake/Output Summary (Last 24 hours) at 04/26/14 1034 Last data filed at 04/25/14 1743  Gross per 24 hour  Intake    410 ml  Output      0 ml  Net    410 ml   Filed Weights   04/23/14 0500 04/24/14 0500 04/25/14 0500  Weight: 66.8 kg (147 lb 4.3 oz) 59.7 kg (131 lb 9.8 oz) 61.7 kg (136 lb 0.4 oz)    Exam:   General: Ill-appearing 38 year old woman in no acute distress.  Skin: Grossly jaundiced.  Nasal mucosa moist with mild rhinorrhea.  Cardiovascular: S1, S2, with no murmurs rubs or gallops.  Respiratory: Occasional scattered wheezes, good air movement, nonlabored breathing.  Abdomen: soft, positive bowel sounds, mild to moderate fluid wave, mild generalized tenderness.  Musculoskeletal: 1 plus bilateral lower extremity edema.  Neuropsychiatric: She is alert and oriented x3.   Data Reviewed: Basic Metabolic Panel:  Recent Labs Lab 04/20/14 0710 04/21/14 0537 04/22/14 0504 04/23/14 0449 04/24/14 0456 04/25/14 0830  04/25/14 0906 04/26/14 0550  NA 134* 132* 134* 138 138 135*  --  134*  K 3.9 3.7 3.4* 3.2* 3.3* 4.1  --  4.6  CL 100 98 100 101 99 97  --  96  CO2 19 17* 17* 19 21 22   --  22  GLUCOSE 126* 112* 99 96 112* 106*  --  94  BUN 41* 35* 29* 24* 24* 29*  --  34*  CREATININE 3.61* 3.03* 2.08* 1.68* 1.59* 2.12*  --  2.44*  CALCIUM 7.0* 6.9* 7.3* 7.4* 7.5* 7.9*  --  8.6  MG 1.0*  --  1.6  --   --   --  1.0*  --   PHOS 1.3* 2.3 2.8 3.8 4.1  --   --   --    Liver Function Tests:  Recent Labs Lab 04/19/14 2049 04/20/14 0710 04/22/14 0504 04/24/14 0456  AST 128* 121* 76* 59*  ALT 19 17 13 12   ALKPHOS 185* 160* 135* 128*  BILITOT 8.6* 8.3* 6.6* 8.1*  PROT 7.9 7.2 6.7 6.3  ALBUMIN 2.2* 1.9* 2.5* 2.2*    Recent Labs Lab 04/19/14 2049  LIPASE 79*    Recent Labs Lab 04/19/14 2049  AMMONIA 69*   CBC:  Recent Labs Lab 04/19/14 2049  04/21/14 0537 04/22/14 0504 04/23/14 0449 04/24/14 0456 04/25/14 0830  WBC 14.3*  < > 10.8* 9.3 10.6* 12.0* 13.6*  NEUTROABS 10.7*  --   --   --   --   --   --  HGB 6.5*  < > 8.3* 7.7* 8.0* 8.2* 8.6*  HCT 18.7*  < > 23.9* 22.2* 23.2* 23.6* 25.2*  MCV 97.9  < > 93.4 91.7 92.1 92.9 94.7  PLT 254  < > 202 179 173 157 169  < > = values in this interval not displayed. Cardiac Enzymes: No results found for this basename: CKTOTAL, CKMB, CKMBINDEX, TROPONINI,  in the last 168 hours BNP (last 3 results) No results found for this basename: PROBNP,  in the last 8760 hours CBG: No results found for this basename: GLUCAP,  in the last 168 hours  Recent Results (from the past 240 hour(s))  URINE CULTURE     Status: None   Collection Time    04/17/14 12:12 PM      Result Value Ref Range Status   Specimen Description URINE, CLEAN CATCH   Final   Special Requests IMMUNE:COMPROMISED   Final   Culture  Setup Time     Final   Value: 04/17/2014 18:58     Performed at [facility]     Final   Value: 65,000 COLONIES/ML      Performed at [facility]   Culture     Final   Value: ENTEROCOCCUS SPECIES     Performed at [facility]   Report Status 04/20/2014 FINAL   Final   Organism ID, Bacteria ENTEROCOCCUS SPECIES   Final  URINE CULTURE     Status: None   Collection Time    04/19/14  9:58 PM      Result Value Ref Range Status   Specimen Description URINE, CATHETERIZED   Final   Special Requests NONE   Final   Culture  Setup Time     Final   Value: 04/20/2014 13:56     Performed at [name]     Final   Value: NO GROWTH     Performed at [facility]   Culture     Final   Value: NO GROWTH     Performed at [facility]   Report Status 04/21/2014 FINAL   Final  MRSA PCR SCREENING     Status: None   Collection Time    04/20/14  6:23 AM      Result Value Ref Range Status   MRSA by PCR NEGATIVE  NEGATIVE Final   Comment:            The GeneXpert MRSA Assay (FDA     approved for NASAL specimens     only), is one component of a     comprehensive MRSA colonization     surveillance program. It is not     intended to diagnose MRSA     infection nor to guide or     monitor treatment for     MRSA infections.  CLOSTRIDIUM DIFFICILE BY PCR     Status: Abnormal   Collection Time    04/21/14  3:29 AM      Result Value Ref Range Status   C difficile by pcr POSITIVE (*) NEGATIVE Final   Comment: REPEATED TO VERIFY     HEATH,S AT 0712 BY GODFREY,O ON 04/21/14     CRITICAL RESULT CALLED TO, READ BACK BY AND VERIFIED WITH:     HEATH,S AT 0712 BY GODFREY,O ON 04/21/14  BODY FLUID CULTURE     Status: None   Collection Time    04/23/14  2:20  PM      Result Value Ref Range Status   Specimen Description ASCITIC FLUID   Final   Special Requests NONE   Final   Gram Stain     Final   Value: NO WBC SEEN     NO ORGANISMS SEEN     Performed at [facility]   Culture     Final   Value: NO GROWTH 1 DAY     Performed at [facility]   Report  Status PENDING   Incomplete     Studies: Dg Chest Port 1 View  04/25/2014   CLINICAL DATA:  Edema, possible pneumonia  EXAM: PORTABLE CHEST - 1 VIEW  COMPARISON:  04/19/2014  FINDINGS: Cardiomediastinal silhouette is stable. No acute infiltrate or pleural effusion. No pulmonary edema. Bony thorax is unremarkable.  IMPRESSION: No active disease.   Electronically Signed   By: Lahoma Crocker M.D.   On: 04/25/2014 10:05    Scheduled Meds: . loratadine  10 mg Oral Daily  . magnesium oxide  400 mg Oral BID  . midodrine  10 mg Oral TID WC  . pantoprazole  40 mg Oral BID AC  . potassium chloride  20 mEq Oral Daily  . saccharomyces boulardii  250 mg Oral BID  . sodium bicarbonate  650 mg Oral BID  . sodium chloride  3 mL Intravenous Q12H  . spironolactone  25 mg Oral Daily  . torsemide  30 mg Oral Daily  . vancomycin  125 mg Oral 4 times per day   Continuous Infusions:    Principal Problem:   Sepsis Active Problems:   C. difficile colitis   Acute renal failure   Ascites   Sinus tachycardia   Cirrhosis   Anemia   Hypokalemia   Hypomagnesemia    Time spent: 65mins    Strother Everitt  [facility] Pager [phone]. If 7PM-7AM, please contact night-coverage at [email or web address], password [facility] 04/26/2014, 10:34 AM  LOS: 7 days

## 2014-04-27 ENCOUNTER — Encounter (HOSPITAL_COMMUNITY): Payer: Self-pay

## 2014-04-27 ENCOUNTER — Inpatient Hospital Stay (HOSPITAL_COMMUNITY): Payer: Medicare Other

## 2014-04-27 LAB — CBC
HCT: 25.7 % — ABNORMAL LOW (ref 36.0–46.0)
Hemoglobin: 8.7 g/dL — ABNORMAL LOW (ref 12.0–15.0)
MCH: 31.6 pg (ref 26.0–34.0)
MCHC: 33.9 g/dL (ref 30.0–36.0)
MCV: 93.5 fL (ref 78.0–100.0)
Platelets: 187 10*3/uL (ref 150–400)
RBC: 2.75 MIL/uL — AB (ref 3.87–5.11)
RDW: 22.5 % — AB (ref 11.5–15.5)
WBC: 15.5 10*3/uL — AB (ref 4.0–10.5)

## 2014-04-27 LAB — COMPREHENSIVE METABOLIC PANEL
ALBUMIN: 2.5 g/dL — AB (ref 3.5–5.2)
ALT: 16 U/L (ref 0–35)
AST: 91 U/L — AB (ref 0–37)
Alkaline Phosphatase: 127 U/L — ABNORMAL HIGH (ref 39–117)
Anion gap: 16 — ABNORMAL HIGH (ref 5–15)
BILIRUBIN TOTAL: 7.3 mg/dL — AB (ref 0.3–1.2)
BUN: 41 mg/dL — ABNORMAL HIGH (ref 6–23)
CHLORIDE: 97 meq/L (ref 96–112)
CO2: 21 mEq/L (ref 19–32)
CREATININE: 2.15 mg/dL — AB (ref 0.50–1.10)
Calcium: 8.9 mg/dL (ref 8.4–10.5)
GFR calc Af Amer: 32 mL/min — ABNORMAL LOW (ref >90)
GFR calc non Af Amer: 28 mL/min — ABNORMAL LOW (ref >90)
Glucose, Bld: 108 mg/dL — ABNORMAL HIGH (ref 70–99)
POTASSIUM: 4.6 meq/L (ref 3.7–5.3)
Sodium: 134 mEq/L — ABNORMAL LOW (ref 137–147)
Total Protein: 7.7 g/dL (ref 6.0–8.3)

## 2014-04-27 LAB — BODY FLUID CULTURE
Culture: NO GROWTH
Gram Stain: NONE SEEN

## 2014-04-27 LAB — PROTIME-INR
INR: 1.48 (ref 0.00–1.49)
Prothrombin Time: 17.9 seconds — ABNORMAL HIGH (ref 11.6–15.2)

## 2014-04-27 LAB — MAGNESIUM
MAGNESIUM: 1.6 mg/dL (ref 1.5–2.5)
Magnesium: 1.6 mg/dL (ref 1.5–2.5)

## 2014-04-27 LAB — GLUCOSE, CAPILLARY: Glucose-Capillary: 111 mg/dL — ABNORMAL HIGH (ref 70–99)

## 2014-04-27 LAB — PHOSPHORUS: Phosphorus: 4.7 mg/dL — ABNORMAL HIGH (ref 2.3–4.6)

## 2014-04-27 MED ORDER — OXYCODONE HCL 5 MG PO TABS: 5.0000 mg | ORAL_TABLET | Freq: Four times a day (QID) | ORAL | Status: DC | PRN

## 2014-04-27 MED ORDER — LORATADINE 10 MG PO TABS: 10.0000 mg | ORAL_TABLET | Freq: Every day | ORAL | Status: DC

## 2014-04-27 MED ORDER — BENZONATATE 100 MG PO CAPS: 100.0000 mg | ORAL_CAPSULE | Freq: Three times a day (TID) | ORAL | Status: DC | PRN

## 2014-04-27 MED ORDER — SPIRONOLACTONE 25 MG PO TABS: ORAL_TABLET | ORAL | Status: DC

## 2014-04-27 MED ORDER — MIDODRINE HCL 10 MG PO TABS: 10.0000 mg | ORAL_TABLET | Freq: Two times a day (BID) | ORAL | Status: DC

## 2014-04-27 MED ORDER — TORSEMIDE 10 MG PO TABS: 30.0000 mg | ORAL_TABLET | Freq: Every day | ORAL | Status: DC

## 2014-04-27 MED ORDER — PANTOPRAZOLE SODIUM 40 MG PO TBEC: DELAYED_RELEASE_TABLET | ORAL | Status: DC

## 2014-04-27 MED ORDER — MAGNESIUM OXIDE 400 (241.3 MG) MG PO TABS: 400.0000 mg | ORAL_TABLET | Freq: Every day | ORAL | Status: DC

## 2014-04-27 MED ORDER — NYSTATIN 100000 UNIT/GM EX CREA: TOPICAL_CREAM | Freq: Two times a day (BID) | CUTANEOUS | Status: DC | PRN

## 2014-04-27 MED ORDER — VANCOMYCIN HCL 125 MG PO CAPS: 125.0000 mg | ORAL_CAPSULE | Freq: Four times a day (QID) | ORAL | Status: DC

## 2014-04-27 MED ORDER — SPIRONOLACTONE 50 MG PO TABS: ORAL_TABLET | ORAL | Status: DC

## 2014-04-27 MED ORDER — ALBUTEROL SULFATE HFA 108 (90 BASE) MCG/ACT IN AERS: 2.0000 | INHALATION_SPRAY | Freq: Four times a day (QID) | RESPIRATORY_TRACT | Status: DC | PRN

## 2014-04-27 MED ORDER — FLUCONAZOLE 100 MG PO TABS
50.0000 mg | ORAL_TABLET | Freq: Once | ORAL | Status: AC
  Administered 2014-04-27: 50 mg via ORAL
  Filled 2014-04-27: qty 1

## 2014-04-27 MED ORDER — PREDNISONE 10 MG PO TABS: ORAL_TABLET | ORAL | Status: DC

## 2014-04-27 MED ORDER — SPIRONOLACTONE 25 MG PO TABS
50.0000 mg | ORAL_TABLET | Freq: Every day | ORAL | Status: DC
  Administered 2014-04-27: 50 mg via ORAL
  Filled 2014-04-27: qty 2

## 2014-04-27 MED ORDER — VANCOMYCIN HCL 125 MG PO CAPS: ORAL_CAPSULE | ORAL | Status: DC

## 2014-04-27 NOTE — Progress Notes (Signed)
Subjective: Interval History: Patient feels better but complains of increasing her leg edema and ascites. Denies any dizyness. Diarrhea is also improving Objective: Vital signs in last 24 hours: Temp:  [97.8 F (36.6 C)-98 F (36.7 C)] 97.8 F (36.6 C) (09/17 2123) Pulse Rate:  [57-81] 76 (09/17 2123) Resp:  [20] 20 (09/17 2123) BP: (62-105)/(40-54) 62/40 mmHg (09/17 2123) SpO2:  [97 %-99 %] 99 % (09/17 2123) Weight change:   Intake/Output from previous day: 09/17 0701 - 09/18 0700 In: 1040 [P.O.:720; I.V.:320] Out: 450 [Urine:450] Intake/Output this shift:    Generally patient is alert and in no apparent distress No elevated JVD Chest is clear Heart: RRR no murumer Abdomen : Distended but none tender Extremities: 2+ edema   Lab Results:  Recent Labs  04/25/14 0830 04/27/14 0730  WBC 13.6* 15.5*  HGB 8.6* 8.7*  HCT 25.2* 25.7*  PLT 169 187   BMET:   Recent Labs  04/26/14 0550 04/27/14 0730  NA 134* 134*  K 4.6 4.6  CL 96 97  CO2 22 21  GLUCOSE 94 108*  BUN 34* 41*  CREATININE 2.44* 2.15*  CALCIUM 8.6 8.9   No results found for this basename: PTH,  in the last 72 hours Iron Studies: No results found for this basename: IRON, TIBC, TRANSFERRIN, FERRITIN,  in the last 72 hours  Studies/Results: Dg Chest Port 1 View  04/25/2014   CLINICAL DATA:  Edema, possible pneumonia  EXAM: PORTABLE CHEST - 1 VIEW  COMPARISON:  04/19/2014  FINDINGS: Cardiomediastinal silhouette is stable. No acute infiltrate or pleural effusion. No pulmonary edema. Bony thorax is unremarkable.  IMPRESSION: No active disease.   Electronically Signed   By: Lahoma Crocker M.D.   On: 04/25/2014 10:05    I have reviewed the patient's current medications.  Assessment/Plan: Problem #1 acute kidney injury superimposed on chronic. Her renal function is stable Problem #2 anemia: Her hemoglobin and hematocrit is below her target range . It is stable Problem #3 metabolic bone disease: Her calcium  and her phosphorus in range.  Problem #4 C. difficile colitis has improved. She had one episode of diarrhea yesterday. Problem #5 liver cirrhosis Problem #6 bipolar disorder Problem #7 anasarca: Patient is ascites: Seems to be worsening. Patient presently on Demadex /Aldactone. Patient was started on ivf possibly for hypotesnsion Problem #8 metabolic acidosis:She is on sodium bicarbonate her CO2 is still low but stable. Problem #9 hypokalemia: Patient potassium has corrected Problem#10 Hypotesion: Her blood pressure remains low . Presently on Midodrine. There is no siginficant difference with hydration.  Plan: d/c ivf Continue with Demadex Increase her aldactone to 50 mg once a day Basic metabolic panel in am      LOS: 8 days   Wanda [name] S 04/27/2014,8:15 AM

## 2014-04-27 NOTE — Telephone Encounter (Signed)
PT NEEDS OPV E30 C DIFF/CIRRHOSIS IN 4-6 WEEKS.

## 2014-04-27 NOTE — Discharge Summary (Signed)
Physician Discharge Summary  Wanda [name] XFG:[id number] DOB: 1975/11/27 DOA: 04/19/2014  PCP: Default, Provider, MD  Admit date: 04/19/2014 Discharge date: 04/27/2014  Time spent: Greater than 30 minutes  Recommendations for Outpatient Follow-up:  1. The patient was instructed to go to [facility] lab on 05/01/14 for basic metabolic panel to assess her renal function and potassium.     Discharge Diagnoses:  1. Sepsis secondary to C. difficile colitis. 2. C. difficile colitis. 3. Mild acute bronchitis. 4. Leukocytosis, secondary to infection and steroid treatment. 5. Hypotension secondary to volume depletion/dehydration initially, and secondarily from chronic cirrhosis. 6. Acute renal injury secondary to prerenal azotemia and hypoperfusion. Creatinine 2.15 at the time of discharge. 7. Metabolic acidosis secondary to diarrhea, sepsis, and acute renal failure. 8. Hypomagnesemia and hypokalemia. 9. 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 --- Coagulopathy, ascites, esophageal varices. 10. Recurrent ascites, status post paracentesis 04/27/14 yielding 2.9 L of fluid and on 04/23/14 yielding 4.8 L of fluid. 11. Acute on chronic anemia, likely associated with chronic disease superimposed on acute illness. Status post 2 units of packed red blood cells. The patient's hemoglobin was 6.5 on admission and 8.7 at the time of discharge. 12. Anxiousness.  Discharge Condition: Improved.  Diet recommendation: Heart healthy.  Filed Weights   04/23/14 0500 04/24/14 0500 04/25/14 0500  Weight: 66.8 kg (147 lb 4.3 oz) 59.7 kg (131 lb 9.8 oz) 61.7 kg (136 lb 0.4 oz)    History of present illness:   Wanda [name] is a 38 y.o. female has a past medical history significant for NASH cirrhosis, decompensated with history of GI bleed due to esophageal varices, recurrent ascites with a recent paracentesis last week with 5 L of fluid removed, coagulopathy, acute on chronic renal failure. She  presented to the emergency room with the chief complaint of weakness, dehydration and ongoing diarrhea. She was seen on September 3 as an outpatient for her diarrhea, and a stool sample was positive for C. difficile. She was started on by mouth vancomycin, however was unable to fill it because the pharmacy needed to order it per patient. She was able to get it filled later and she only took 3-4 doses. She described watery perfuse diarrhea which was almost "continuous". She also endorsed nausea and intermittent vomiting. She denied any blood in her emesis or blood in her stool. She denied any coffee ground material and she denied any dark tarry stools. She had no fever or chills and she had no shortness of breath or chest pain. She endorsed mild lightheadedness when she stood up. She endorsed decreased urinary output. She endorsed abdominal distention.  In the emergency room, she was found to be in acute on chronic renal failure with an elevated creatinine and was found to be hypokalemic. Also anemic with a hemoglobin of 6.5. She had an elevated bilirubin at 8.6, and an INR of 1.5. TRH was asked for admission for severe dehydration and ongoing diarrhea.    Hospital Course:   1. C. difficile colitis.  The patient was diagnosed with C. difficile diarrhea several days prior to hospital admission. She was started on oral vancomycin in the outpatient setting, but was only able to take a few doses. She was restarted on treatment with oral vancomycin. Diuretic therapy was held as she was vigorously hydrated. Probiotic therapy was also initiated. Additional stool studies were ordered. The GI pathogen panel was otherwise negative. She continued to improve clinically and symptomatically. At the time of hospital discharge, she was only having  one small loose bowel movement daily. She receives 7-1/2 days of therapy during hospitalization. She was instructed to complete the course of vancomycin prescribed earlier,  however, she was given another prescription for vancomycin to complete the course because she received 2 days of azithromycin treatment for acute bronchitis during hospitalization. Therefore, she should complete the course on 05/06/14. 2. Sepsis secondary to C. difficile colitis.  She was treated as above with vancomycin and vigorous IV fluid hydration. Her blood pressure waxed and waned, but overall improved. 3. Acute renal failure, secondary to prerenal azotemia/dehydration/hypoperfusion from hypotension. The patient's creatinine during the July hospitalization was 1.19 prior to discharge. On admission, during this hospitalization, it was 4.13, although, it had been 2.90 a couple days prior in the ED. She was started on vigorous IV fluid hydration. spironolactone and furosemide were withheld initially. Nephrology was consulted. They adjusted the IV fluids. Following improvement in her renal function, IV fluids were tapered down and she was restarted on spironolactone and then restarted on torsemide. Her urine output improved. At the time of hospital discharge, her creatinine was 2.15, above her baseline, but improved nevertheless. She was discharged on a slightly different dosing of spironolactone and torsemide in  that the dose of spironolactone was increased and the dose of torsemide was decreased compared to her home dosing. She will followup with nephrologist, Dr. Lowanda Foster in a few weeks.  4. Metabolic acidosis. The patient CO2 fell to 17. This was thought to be secondary to diarrheal losses and acute renal failure. She was supplemented with oral bicarbonate. Her CO2 improved and normalized. Sodium bicarbonate was discontinued at the time of discharge as her diarrhea had resolved. 5. Hypokalemia and hypomagnesemia. The patient's serum potassium fell during hospitalization which was secondary to a combination of diarrhea and diuretic therapy, coupled with sodium bicarbonate therapy. She was supplemented  and repleted in the IV fluids and orally. Magnesium level was assessed and it was low. She was given 2 g of magnesium sulfate IV and oral magnesium oxide. At the time of hospital discharge, both normalized. Because the spironolactone dosing was increased, potassium chloride supplementation was discontinued upon discharge as her serum potassium was 4.6. She was continued on a small dose of oral magnesium oxide. She will need to have close followup of her electrolytes as well as her renal function. She will be scheduled for an outpatient BMET next week and results will be called to the dictating physician. 6. Acute on chronic anemia. The patient's hemoglobin was 6.5 at the time of hospital admission. She had no complaints of hematemesis, black tarry stools, or bright red blood per. She had been recently hospitalized in July for an upper GI bleed secondary to esophageal varices. There was no evidence of this during hospitalization. She was inadvertently continued on protonix, but this was discontinued upon discharge. The patient was instructed to restart Protonix following the treatment with vancomycin. Her acute anemia was thought to be secondary to acute illness/infection. She was transfused 2 units of packed red blood cells. Her hemoglobin improved appropriately. It was 8.7 the time of discharge. 7. Hepatic cirrhosis secondary to NASH with associated coagulopathy, ascites, esophageal varices. Of note, the patient was offered evaluation for liver transplant in the recent past per [name], GI PA. but the patient had refused. As above, the patient was started on vigorous IV fluid hydration while her diuretic therapy was withheld. Eventually she needed to have paracentesis for worsening ascites. She underwent paracentesis on 2 occasions yielding 4.8 L and  2.9 L respectively. She did have some white blood cells in her ascitic fluid, but this was not considered to be high enough to be classified as SBP. The cultures  were negative at the time of hospital discharge. She will followup with GI in several weeks. 8. Mild acute bronchitis with superimposed seasonal allergies. The patient developed a cough and nasal congestion. A chest x-ray was ordered, but he did not reveal pneumonia. She was started on treatment with Claritin, azithromycin, small prednisone taper, and nebulizers. Her symptoms improved. Azithromycin was discontinued upon discharge. She was continued on a prednisone taper and albuterol inhaler as needed. 9. Leukocytosis. Her leukocytosis improved, but her white blood cell count increased prior to hospital discharge. This was secondary to steroid therapy. She was afebrile and clinically improved at the time of discharge.    Procedures:  Ultrasound-guided paracentesis on 04/27/14 yielding 2.9 L and on 04/23/14 yielding 4.5 L.  Consultations:  Nephrology  Discharge Exam: Filed Vitals:   04/27/14 1042  BP: 100/52  Pulse: 92  Temp:   Resp:    temperature 97.9. Respiratory rate 18. Oxygen saturation 99% on room air.  General: Ill-appearing 38 year old woman in no acute distress. Skin: Grossly jaundiced. Nasal mucosa moist with mild rhinorrhea. Cardiovascular: S1, S2, with no murmurs rubs or gallops. Respiratory: Occasional scattered wheezes, good air movement, nonlabored breathing. Abdomen: soft, positive bowel sounds, mild fluid wave, mild generalized tenderness. Musculoskeletal: 1 plus bilateral lower extremity edema. Neuropsychiatric: She is alert and oriented x3.    Discharge Instructions You were cared for by a hospitalist during your hospital stay. If you have any questions about your discharge medications or the care you received while you were in the hospital after you are discharged, you can call the unit and asked to speak with the hospitalist on call if the hospitalist that took care of you is not available. Once you are discharged, your primary care physician will handle any further  medical issues. Please note that NO REFILLS for any discharge medications will be authorized once you are discharged, as it is imperative that you return to your primary care physician (or establish a relationship with a primary care physician if you do not have one) for your aftercare needs so that they can reassess your need for medications and monitor your lab values.  Discharge Instructions   Diet - low sodium heart healthy    Complete by:  As directed      Discharge instructions    Complete by:  As directed   You will need blood work taken on next Tuesday on 05/01/14 to check your kidney test and potassium.     Increase activity slowly    Complete by:  As directed           Current Discharge Medication List    START taking these medications   Details  albuterol (PROVENTIL HFA;VENTOLIN HFA) 108 (90 BASE) MCG/ACT inhaler Inhale 2 puffs into the lungs every 6 (six) hours as needed for wheezing or shortness of breath. Qty: 1 Inhaler, Refills: 2    benzonatate (TESSALON) 100 MG capsule Take 1 capsule (100 mg total) by mouth 3 (three) times daily as needed for cough. Qty: 20 capsule, Refills: 0    loratadine (CLARITIN) 10 MG tablet Take 1 tablet (10 mg total) by mouth daily.    magnesium oxide (MAG-OX) 400 (241.3 MG) MG tablet Take 1 tablet (400 mg total) by mouth daily. Qty: 30 tablet, Refills: 3    midodrine (PROAMATINE)  10 MG tablet Take 1 tablet (10 mg total) by mouth 2 (two) times daily with a meal. Medication to keep your blood pressure from falling. Qty: 60 tablet, Refills: 3    nystatin cream (MYCOSTATIN) Apply topically 2 (two) times daily as needed. Yeast infection    predniSONE (DELTASONE) 10 MG tablet Starting tomorrow take a take 1 tablet daily for 3 more days for treatment of bronchitis. Qty: 3 tablet, Refills: 0      CONTINUE these medications which have CHANGED   Details  oxyCODONE (ROXICODONE) 5 MG immediate release tablet Take 1 tablet (5 mg total) by mouth  every 6 (six) hours as needed for severe pain. Qty: 20 tablet, Refills: 0    pantoprazole (PROTONIX) 40 MG tablet Restart this medication 1 tablet 2 times daily after you have completed taking the course of Vancomycin. For management of GI bleeding.    !! spironolactone (ALDACTONE) 25 MG tablet Take 2 tablets once daily until you run out of the 25 mg tablets; then start the 50 mg tablets- 1 tablet daily thereafter.    !! spironolactone (ALDACTONE) 50 MG tablet Start this 50 mg tablet 1 time daily after you finish your 25 mg tablets. Qty: 30 tablet, Refills: 3    torsemide (DEMADEX) 10 MG tablet Take 3 tablets (30 mg total) by mouth daily. Qty: 90 tablet, Refills: 3    !! vancomycin (VANCOCIN) 125 MG capsule Take 1 capsule (125 mg total) by mouth 4 (four) times daily. After you finish this Vancomycin, start and complete the other Vancomycin prescription.    !! vancomycin (VANCOCIN) 125 MG capsule Start this Vancomycin prescription after you have completed the other Vancomycin course. Take 1 capsule four times daily until completed. Qty: 12 capsule, Refills: 0     !! - Potential duplicate medications found. Please discuss with provider.    CONTINUE these medications which have NOT CHANGED   Details  ondansetron (ZOFRAN) 8 MG tablet Take 1 tablet (8 mg total) by mouth every 4 (four) hours as needed. Qty: 8 tablet, Refills: 0      STOP taking these medications     potassium chloride (MICRO-K) 10 MEQ CR capsule        Allergies  Allergen Reactions  . Lasix [Furosemide]     "doesn't work"   Follow-up Information   Follow up with Barney Drain, MD On 06/13/2014. (at 8:30 am)    Specialty:  Gastroenterology   Contact information:   [address] [place] [address] [phone]       Follow up with [facility], MD On 05/29/2014. (at 12:00 pm. Please do blood work  one week prior appointment.)    Specialty:  Nephrology   Contact  information:   [address] [address] [phone]       Follow up with [name] On 05/03/2014. (10:30 am)    Contact information:   [place] [address] [phone]        The results of significant diagnostics from this hospitalization (including imaging, microbiology, ancillary and laboratory) are listed below for reference.    Significant Diagnostic Studies: US Paracentesis  04/27/2014   CLINICAL DATA:  Recurrent ascites.  EXAM: ULTRASOUND GUIDED PARACENTESIS  COMPARISON:  04/23/2014  PROCEDURE: An ultrasound guided paracentesis was thoroughly discussed with the patient and questions answered. The benefits, risks, alternatives and complications were also discussed. The patient understands and wishes to proceed with  the procedure. Written consent was obtained.  Ultrasound was performed to localize and mark an adequate pocket of fluid in the right lower quadrant quadrant of the abdomen. The area was then prepped and draped in the normal sterile fashion. 1% Lidocaine was used for local anesthesia. Under ultrasound guidance a 5 [place] Yueh catheter was introduced. Paracentesis was performed. The catheter was removed and a dressing applied.  Complications: None.  FINDINGS: A total of approximately 2900 mL of serous fluid was removed. A fluid sample was not sent for laboratory analysis.  IMPRESSION: Successful ultrasound guided paracentesis yielding 2900 mL of ascites.   Electronically Signed   By: Logan Bores   On: 04/27/2014 11:57   US Paracentesis  04/23/2014   CLINICAL DATA:  Ascites.  EXAM: ULTRASOUND GUIDED PARACENTESIS  COMPARISON:  April 13, 2014.  PROCEDURE: An ultrasound guided paracentesis was thoroughly discussed with the patient and questions answered. The benefits, risks, alternatives and complications were also discussed. The patient understands and wishes to proceed with the procedure. Written consent was obtained.   Ultrasound was performed to localize and mark an adequate pocket of fluid in the right lower quadrant of the abdomen. The area was then prepped and draped in the normal sterile fashion. 1% Lidocaine was used for local anesthesia. Under ultrasound guidance a paracentesis catheter was introduced. Paracentesis was performed. The catheter was removed and a dressing applied.  COMPLICATIONS: None immediate.  FINDINGS: A total of approximately 4800 mL of serous fluid was removed. A fluid sample was sent for laboratory analysis.  IMPRESSION: Successful ultrasound guided paracentesis yielding 4800 mL of ascites.   Electronically Signed   By: Sabino Dick M.D.   On: 04/23/2014 15:08   US Paracentesis  04/13/2014   CLINICAL DATA:  Cirrhosis, ascites  EXAM: ULTRASOUND GUIDED DIAGNOSTIC AND THERAPEUTIC PARACENTESIS  COMPARISON:  02/28/2014  PROCEDURE: Procedure, benefits, and risks of procedure were discussed with patient.  Written informed consent for procedure was obtained.  Time out protocol followed.  Adequate collection of ascites localized by ultrasound in RIGHT lower quadrant.  Skin prepped and draped in usual sterile fashion.  Skin and soft tissues anesthetized with 10 mL of 1% lidocaine.  5 [place] Yueh catheter placed into peritoneal cavity.  5300 mL of yellow fluid aspirated by vacuum bottle suction.  Procedure tolerated well by patient without immediate complication.  FINDINGS: A total of approximately 5300 mL of yellow ascitic fluid was removed. A fluid sample of 180 mL sent for laboratory analysis.  IMPRESSION: Successful ultrasound guided paracentesis yielding 5300 mL of ascites.   Electronically Signed   By: Lavonia Dana M.D.   On: 04/13/2014 16:08   Dg Chest Port 1 View  04/25/2014   CLINICAL DATA:  Edema, possible pneumonia  EXAM: PORTABLE CHEST - 1 VIEW  COMPARISON:  04/19/2014  FINDINGS: Cardiomediastinal silhouette is stable. No acute infiltrate or pleural effusion. No pulmonary edema. Bony thorax is  unremarkable.  IMPRESSION: No active disease.   Electronically Signed   By: Lahoma Crocker M.D.   On: 04/25/2014 10:05   Dg Abd Acute W/chest  04/19/2014   CLINICAL DATA:  Dehydration and weakness for a week. Fluid in the abdomen.  EXAM: ACUTE ABDOMEN SERIES (ABDOMEN 2 VIEW & CHEST 1 VIEW)  COMPARISON:  Chest 02/25/2014.  Abdomen series 718 2015.  FINDINGS: Shallow inspiration. Atelectasis in the lung bases. Normal heart size and pulmonary vascularity. No focal airspace disease or consolidation in the lungs. No blunting of costophrenic angles. No pneumothorax.  Mediastinal contours appear intact.  Abdominal distention with diffuse underpenetration consistent with ascites. Scattered gas and stool in the colon. No small or large bowel distention. No free intra-abdominal air. No abnormal air-fluid levels noted.  IMPRESSION: Shallow inspiration with atelectasis in the lung bases. Nonobstructive bowel gas pattern. Abdominal distention with diffuse underpenetration suggesting ascites.   Electronically Signed   By: Lucienne Capers M.D.   On: 04/19/2014 23:22    Microbiology: Recent Results (from the past 240 hour(s))  URINE CULTURE     Status: None   Collection Time    04/19/14  9:58 PM      Result Value Ref Range Status   Specimen Description URINE, CATHETERIZED   Final   Special Requests NONE   Final   Culture  Setup Time     Final   Value: 04/20/2014 13:56     Performed at [place]     Final   Value: NO GROWTH     Performed at [facility]   Culture     Final   Value: NO GROWTH     Performed at [facility]   Report Status 04/21/2014 FINAL   Final  MRSA PCR SCREENING     Status: None   Collection Time    04/20/14  6:23 AM      Result Value Ref Range Status   MRSA by PCR NEGATIVE  NEGATIVE Final   Comment:            The GeneXpert MRSA Assay (FDA     approved for NASAL specimens     only), is one component of a     comprehensive MRSA colonization      surveillance program. It is not     intended to diagnose MRSA     infection nor to guide or     monitor treatment for     MRSA infections.  CLOSTRIDIUM DIFFICILE BY PCR     Status: Abnormal   Collection Time    04/21/14  3:29 AM      Result Value Ref Range Status   C difficile by pcr POSITIVE (*) NEGATIVE Final   Comment: REPEATED TO VERIFY     HEATH,S AT 0712 BY GODFREY,O ON 04/21/14     CRITICAL RESULT CALLED TO, READ BACK BY AND VERIFIED WITH:     HEATH,S AT 0712 BY GODFREY,O ON 04/21/14  BODY FLUID CULTURE     Status: None   Collection Time    04/23/14  2:20 PM      Result Value Ref Range Status   Specimen Description ASCITIC FLUID   Final   Special Requests NONE   Final   Gram Stain     Final   Value: NO WBC SEEN     NO ORGANISMS SEEN     Performed at [facility]   Culture     Final   Value: NO GROWTH 3 DAYS     Performed at [facility]   Report Status 04/27/2014 FINAL   Final     Labs: Basic Metabolic Panel:  Recent Labs Lab 04/21/14 0537 04/22/14 0504 04/23/14 0449 04/24/14 0456 04/25/14 0830 04/25/14 0906 04/26/14 0550 04/27/14 0730  NA 132* 134* 138 138 135*  --  134* 134*  K 3.7 3.4* 3.2* 3.3* 4.1  --  4.6 4.6  CL 98 100 101 99 97  --  96 97  CO2 17* 17* 19 21 22   --  22 21  GLUCOSE 112* 99 96 112* 106*  --  94 108*  BUN 35* 29* 24* 24* 29*  --  34* 41*  CREATININE 3.03* 2.08* 1.68* 1.59* 2.12*  --  2.44* 2.15*  CALCIUM 6.9* 7.3* 7.4* 7.5* 7.9*  --  8.6 8.9  MG  --  1.6  --   --   --  1.0*  --  1.6  1.6  PHOS 2.3 2.8 3.8 4.1  --   --   --  4.7*   Liver Function Tests:  Recent Labs Lab 04/22/14 0504 04/24/14 0456 04/27/14 0730  AST 76* 59* 91*  ALT 13 12 16   ALKPHOS 135* 128* 127*  BILITOT 6.6* 8.1* 7.3*  PROT 6.7 6.3 7.7  ALBUMIN 2.5* 2.2* 2.5*   No results found for this basename: LIPASE, AMYLASE,  in the last 168 hours No results found for this basename: AMMONIA,  in the last 168 hours CBC:  Recent Labs Lab  04/22/14 0504 04/23/14 0449 04/24/14 0456 04/25/14 0830 04/27/14 0730  WBC 9.3 10.6* 12.0* 13.6* 15.5*  HGB 7.7* 8.0* 8.2* 8.6* 8.7*  HCT 22.2* 23.2* 23.6* 25.2* 25.7*  MCV 91.7 92.1 92.9 94.7 93.5  PLT 179 173 157 169 187   Cardiac Enzymes: No results found for this basename: CKTOTAL, CKMB, CKMBINDEX, TROPONINI,  in the last 168 hours BNP: BNP (last 3 results) No results found for this basename: PROBNP,  in the last 8760 hours CBG:  Recent Labs Lab 04/27/14 0748  GLUCAP 111*       Signed:  Shaeley Segall  [facility] 04/27/2014, 3:05 PM

## 2014-04-30 LAB — BODY FLUID CELL COUNT WITH DIFFERENTIAL
EOS FL: 0 %
Lymphs, Fluid: 23 %
Monocyte-Macrophage-Serous Fluid: 41 % — ABNORMAL LOW (ref 50–90)
NEUTROPHIL FLUID: 6 % (ref 0–25)
Total Nucleated Cell Count, Fluid: 149 cu mm (ref 0–1000)

## 2014-04-30 LAB — BODY FLUID CULTURE: CULTURE: NO GROWTH

## 2014-04-30 LAB — PATHOLOGIST SMEAR REVIEW

## 2014-04-30 NOTE — Telephone Encounter (Signed)
Scheduled 06/13/14  Letter sent

## 2014-05-02 NOTE — Progress Notes (Signed)
Patient's outpatient BMET: Na 141 K 3.2 Cl 99 C02 23 BUN 33 Cr 1.53 Glu 110  Patient was called and informed that her renal function had improved. She was instructed to restart KCL at 30 meq daily ( take 3-10 meq tabs daily). She voiced understanding.

## 2014-05-14 ENCOUNTER — Inpatient Hospital Stay (HOSPITAL_COMMUNITY)
Admission: EM | Admit: 2014-05-14 | Discharge: 2014-05-18 | DRG: 421 | Disposition: A | Discharge status: Home / Self Care | Payer: Medicare Other | Attending: Internal Medicine | Admitting: Internal Medicine

## 2014-05-14 ENCOUNTER — Encounter (HOSPITAL_COMMUNITY): Payer: Self-pay | Admitting: Emergency Medicine

## 2014-05-14 DIAGNOSIS — D62 Acute posthemorrhagic anemia: Secondary | ICD-10-CM

## 2014-05-14 DIAGNOSIS — R748 Abnormal levels of other serum enzymes: Secondary | ICD-10-CM

## 2014-05-14 DIAGNOSIS — R112 Nausea with vomiting, unspecified: Secondary | ICD-10-CM | POA: Diagnosis present

## 2014-05-14 DIAGNOSIS — K7290 Hepatic failure, unspecified without coma: Secondary | ICD-10-CM | POA: Diagnosis present

## 2014-05-14 DIAGNOSIS — I129 Hypertensive chronic kidney disease with stage 1 through stage 4 chronic kidney disease, or unspecified chronic kidney disease: Secondary | ICD-10-CM | POA: Diagnosis present

## 2014-05-14 DIAGNOSIS — D649 Anemia, unspecified: Secondary | ICD-10-CM | POA: Diagnosis present

## 2014-05-14 DIAGNOSIS — K219 Gastro-esophageal reflux disease without esophagitis: Secondary | ICD-10-CM | POA: Diagnosis present

## 2014-05-14 DIAGNOSIS — N183 Chronic kidney disease, stage 3 (moderate): Secondary | ICD-10-CM | POA: Diagnosis present

## 2014-05-14 DIAGNOSIS — E538 Deficiency of other specified B group vitamins: Secondary | ICD-10-CM

## 2014-05-14 DIAGNOSIS — E440 Moderate protein-calorie malnutrition: Secondary | ICD-10-CM | POA: Diagnosis present

## 2014-05-14 DIAGNOSIS — K920 Hematemesis: Secondary | ICD-10-CM | POA: Diagnosis present

## 2014-05-14 DIAGNOSIS — Z6821 Body mass index (BMI) 21.0-21.9, adult: Secondary | ICD-10-CM

## 2014-05-14 DIAGNOSIS — Z8249 Family history of ischemic heart disease and other diseases of the circulatory system: Secondary | ICD-10-CM | POA: Diagnosis not present

## 2014-05-14 DIAGNOSIS — E860 Dehydration: Secondary | ICD-10-CM | POA: Diagnosis present

## 2014-05-14 DIAGNOSIS — R4182 Altered mental status, unspecified: Secondary | ICD-10-CM

## 2014-05-14 DIAGNOSIS — E876 Hypokalemia: Secondary | ICD-10-CM

## 2014-05-14 DIAGNOSIS — K529 Noninfective gastroenteritis and colitis, unspecified: Secondary | ICD-10-CM

## 2014-05-14 DIAGNOSIS — I8501 Esophageal varices with bleeding: Secondary | ICD-10-CM

## 2014-05-14 DIAGNOSIS — Z87891 Personal history of nicotine dependence: Secondary | ICD-10-CM

## 2014-05-14 DIAGNOSIS — R188 Other ascites: Secondary | ICD-10-CM | POA: Diagnosis present

## 2014-05-14 DIAGNOSIS — N179 Acute kidney failure, unspecified: Secondary | ICD-10-CM | POA: Diagnosis present

## 2014-05-14 DIAGNOSIS — A0472 Enterocolitis due to Clostridium difficile, not specified as recurrent: Secondary | ICD-10-CM

## 2014-05-14 DIAGNOSIS — R197 Diarrhea, unspecified: Secondary | ICD-10-CM

## 2014-05-14 DIAGNOSIS — K7460 Unspecified cirrhosis of liver: Secondary | ICD-10-CM

## 2014-05-14 DIAGNOSIS — K7682 Hepatic encephalopathy: Secondary | ICD-10-CM | POA: Diagnosis present

## 2014-05-14 DIAGNOSIS — K769 Liver disease, unspecified: Secondary | ICD-10-CM

## 2014-05-14 DIAGNOSIS — D638 Anemia in other chronic diseases classified elsewhere: Secondary | ICD-10-CM

## 2014-05-14 DIAGNOSIS — R000 Tachycardia, unspecified: Secondary | ICD-10-CM

## 2014-05-14 DIAGNOSIS — R601 Generalized edema: Secondary | ICD-10-CM

## 2014-05-14 DIAGNOSIS — K7581 Nonalcoholic steatohepatitis (NASH): Secondary | ICD-10-CM | POA: Diagnosis present

## 2014-05-14 DIAGNOSIS — D7589 Other specified diseases of blood and blood-forming organs: Secondary | ICD-10-CM

## 2014-05-14 DIAGNOSIS — K652 Spontaneous bacterial peritonitis: Secondary | ICD-10-CM

## 2014-05-14 LAB — RAPID URINE DRUG SCREEN, HOSP PERFORMED
Amphetamines: NOT DETECTED
BARBITURATES: NOT DETECTED
BENZODIAZEPINES: NOT DETECTED
Cocaine: NOT DETECTED
Opiates: NOT DETECTED
Tetrahydrocannabinol: NOT DETECTED

## 2014-05-14 LAB — CBC WITH DIFFERENTIAL/PLATELET
BASOS PCT: 1 % (ref 0–1)
Basophils Absolute: 0.1 10*3/uL (ref 0.0–0.1)
EOS ABS: 0.2 10*3/uL (ref 0.0–0.7)
Eosinophils Relative: 2 % (ref 0–5)
HCT: 28 % — ABNORMAL LOW (ref 36.0–46.0)
Hemoglobin: 9.4 g/dL — ABNORMAL LOW (ref 12.0–15.0)
Lymphocytes Relative: 24 % (ref 12–46)
Lymphs Abs: 2.7 10*3/uL (ref 0.7–4.0)
MCH: 33.1 pg (ref 26.0–34.0)
MCHC: 33.6 g/dL (ref 30.0–36.0)
MCV: 98.6 fL (ref 78.0–100.0)
Monocytes Absolute: 0.7 10*3/uL (ref 0.1–1.0)
Monocytes Relative: 6 % (ref 3–12)
Neutro Abs: 7.6 10*3/uL (ref 1.7–7.7)
Neutrophils Relative %: 67 % (ref 43–77)
PLATELETS: 245 10*3/uL (ref 150–400)
RBC: 2.84 MIL/uL — ABNORMAL LOW (ref 3.87–5.11)
RDW: 19.3 % — ABNORMAL HIGH (ref 11.5–15.5)
WBC: 11.4 10*3/uL — ABNORMAL HIGH (ref 4.0–10.5)

## 2014-05-14 LAB — COMPREHENSIVE METABOLIC PANEL
ALBUMIN: 2.6 g/dL — AB (ref 3.5–5.2)
ALK PHOS: 173 U/L — AB (ref 39–117)
ALT: 20 U/L (ref 0–35)
ANION GAP: 18 — AB (ref 5–15)
AST: 122 U/L — AB (ref 0–37)
BILIRUBIN TOTAL: 2.9 mg/dL — AB (ref 0.3–1.2)
BUN: 19 mg/dL (ref 6–23)
CO2: 23 mEq/L (ref 19–32)
Calcium: 9 mg/dL (ref 8.4–10.5)
Chloride: 101 mEq/L (ref 96–112)
Creatinine, Ser: 1.03 mg/dL (ref 0.50–1.10)
GFR calc Af Amer: 78 mL/min — ABNORMAL LOW (ref >90)
GFR calc non Af Amer: 67 mL/min — ABNORMAL LOW (ref >90)
Glucose, Bld: 108 mg/dL — ABNORMAL HIGH (ref 70–99)
Potassium: 3.9 mEq/L (ref 3.7–5.3)
Sodium: 142 mEq/L (ref 137–147)
TOTAL PROTEIN: 8.6 g/dL — AB (ref 6.0–8.3)

## 2014-05-14 LAB — URINALYSIS, ROUTINE W REFLEX MICROSCOPIC
BILIRUBIN URINE: NEGATIVE
Glucose, UA: NEGATIVE mg/dL
Hgb urine dipstick: NEGATIVE
KETONES UR: NEGATIVE mg/dL
LEUKOCYTES UA: NEGATIVE
Nitrite: NEGATIVE
PH: 5.5 (ref 5.0–8.0)
PROTEIN: NEGATIVE mg/dL
Specific Gravity, Urine: 1.015 (ref 1.005–1.030)
Urobilinogen, UA: 0.2 mg/dL (ref 0.0–1.0)

## 2014-05-14 LAB — GLUCOSE, CAPILLARY: Glucose-Capillary: 96 mg/dL (ref 70–99)

## 2014-05-14 LAB — SAMPLE TO BLOOD BANK

## 2014-05-14 LAB — APTT: APTT: 37 s (ref 24–37)

## 2014-05-14 LAB — PROTIME-INR
INR: 1.31 (ref 0.00–1.49)
Prothrombin Time: 16.3 seconds — ABNORMAL HIGH (ref 11.6–15.2)

## 2014-05-14 LAB — AMMONIA
AMMONIA: 36 umol/L (ref 11–60)
Ammonia: 35 umol/L (ref 11–60)

## 2014-05-14 IMAGING — CR DG CHEST 2V
2 series · 2 of 2 positions shown · non-contrast
Comparison: CT ABD - PELV W/ CM dated 09/11/2013; DG ABD ACUTE
W/CHEST dated 09/11/2013; DG CHEST 1V PORT dated 08/10/2007

CLINICAL DATA: Cough

EXAM:
CHEST  2 VIEW

[view not recorded (1 of 2)]
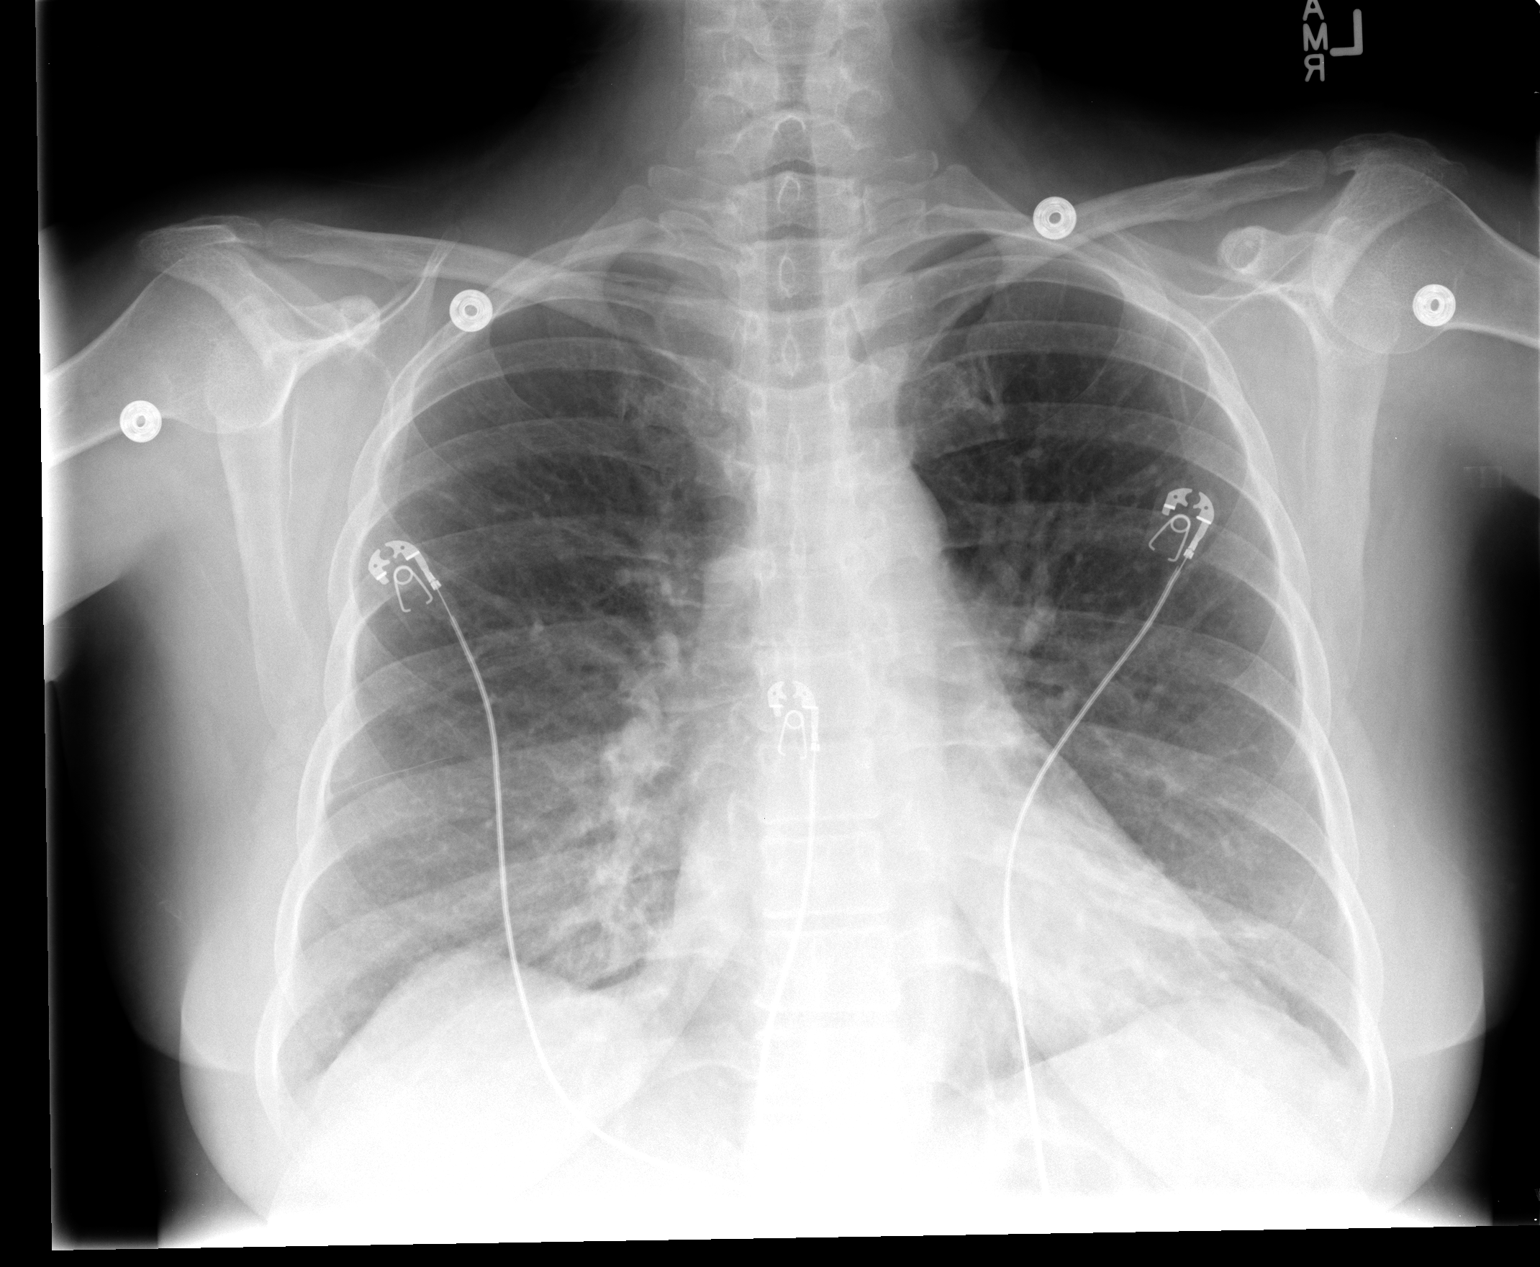

[view not recorded (2 of 2)]
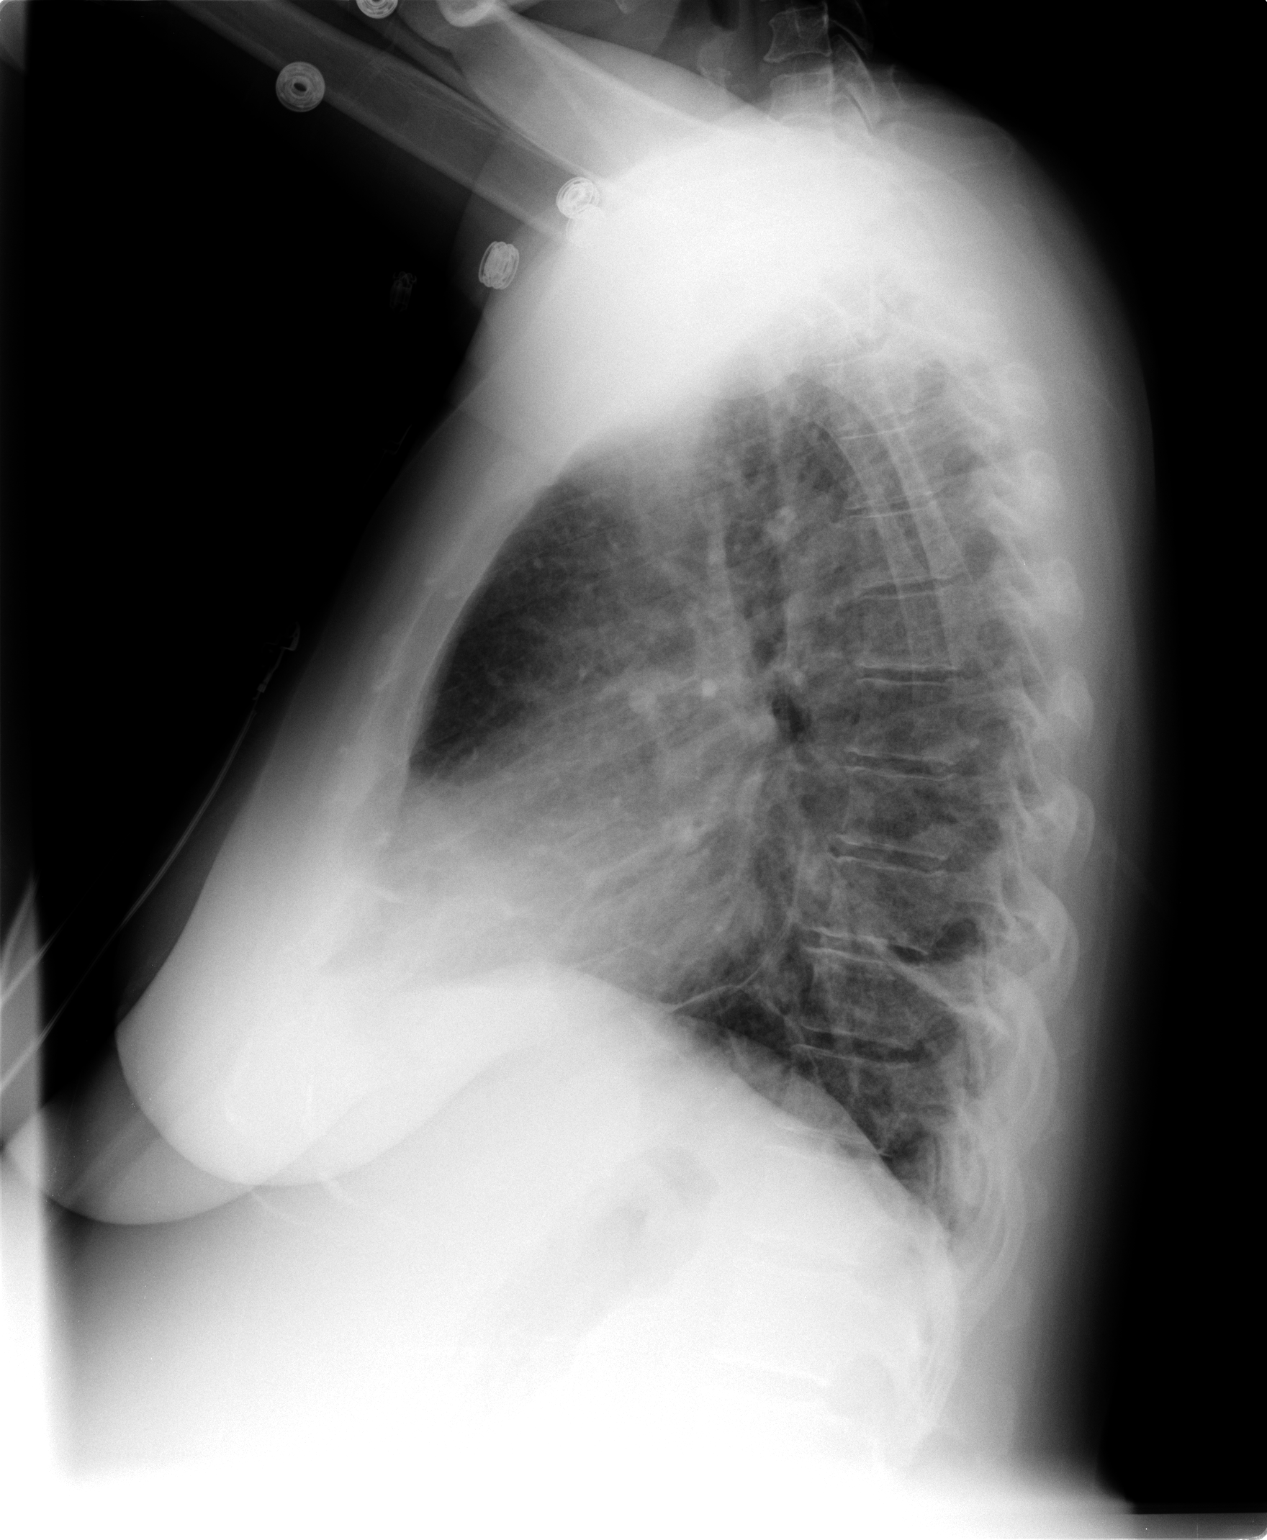

[2 of 2 positions shown; findings below may reference images not displayed]

FINDINGS: Low lung volumes. Cardiac silhouette within normal limits. With
technique taking into consideration there is mild prominence of the
interstitial markings without evidence of peribronchial cuffing. No
focal regions of consolidation or focal infiltrates. Osseous
structures are unremarkable.
IMPRESSION: Mild diffuse interstitial infiltrate infectious versus inflammatory.

## 2014-05-14 MED ORDER — SODIUM CHLORIDE 0.9 % IV SOLN
1000.0000 mL | INTRAVENOUS | Status: DC
  Administered 2014-05-14 (×2): 1000 mL via INTRAVENOUS

## 2014-05-14 MED ORDER — LACTULOSE 10 GM/15ML PO SOLN
30.0000 g | Freq: Three times a day (TID) | ORAL | Status: DC
  Filled 2014-05-14 (×4): qty 60

## 2014-05-14 MED ORDER — MORPHINE SULFATE 2 MG/ML IJ SOLN
2.0000 mg | INTRAMUSCULAR | Status: DC | PRN
  Filled 2014-05-14: qty 1

## 2014-05-14 MED ORDER — MIDODRINE HCL 5 MG PO TABS
10.0000 mg | ORAL_TABLET | Freq: Two times a day (BID) | ORAL | Status: DC
  Administered 2014-05-15 – 2014-05-18 (×7): 10 mg via ORAL
  Filled 2014-05-14 (×7): qty 2

## 2014-05-14 MED ORDER — ONDANSETRON HCL 4 MG/2ML IJ SOLN: 4.0000 mg | Freq: Four times a day (QID) | INTRAMUSCULAR | Status: DC | PRN

## 2014-05-14 MED ORDER — ONDANSETRON HCL 4 MG PO TABS
4.0000 mg | ORAL_TABLET | Freq: Four times a day (QID) | ORAL | Status: DC | PRN
  Administered 2014-05-18: 4 mg via ORAL
  Filled 2014-05-14: qty 1

## 2014-05-14 MED ORDER — SPIRONOLACTONE 25 MG PO TABS
50.0000 mg | ORAL_TABLET | Freq: Every day | ORAL | Status: DC
  Administered 2014-05-14 – 2014-05-18 (×5): 50 mg via ORAL
  Filled 2014-05-14 (×5): qty 2

## 2014-05-14 MED ORDER — PANTOPRAZOLE SODIUM 40 MG PO TBEC
40.0000 mg | DELAYED_RELEASE_TABLET | Freq: Every day | ORAL | Status: DC
  Administered 2014-05-14 – 2014-05-18 (×5): 40 mg via ORAL
  Filled 2014-05-14 (×5): qty 1

## 2014-05-14 MED ORDER — HEPARIN SODIUM (PORCINE) 5000 UNIT/ML IJ SOLN
5000.0000 [IU] | Freq: Three times a day (TID) | INTRAMUSCULAR | Status: DC
  Administered 2014-05-14 – 2014-05-18 (×11): 5000 [IU] via SUBCUTANEOUS
  Filled 2014-05-14 (×11): qty 1

## 2014-05-14 MED ORDER — SODIUM CHLORIDE 0.9 % IV SOLN
1000.0000 mL | Freq: Once | INTRAVENOUS | Status: AC
  Administered 2014-05-14: 1000 mL via INTRAVENOUS

## 2014-05-14 MED ORDER — POTASSIUM CHLORIDE CRYS ER 10 MEQ PO TBCR
10.0000 meq | EXTENDED_RELEASE_TABLET | Freq: Three times a day (TID) | ORAL | Status: DC
  Administered 2014-05-14 – 2014-05-16 (×5): 10 meq via ORAL
  Filled 2014-05-14 (×5): qty 1

## 2014-05-14 MED ORDER — MAGNESIUM OXIDE 400 (241.3 MG) MG PO TABS
400.0000 mg | ORAL_TABLET | Freq: Every day | ORAL | Status: DC
  Administered 2014-05-14 – 2014-05-18 (×5): 400 mg via ORAL
  Filled 2014-05-14 (×5): qty 1

## 2014-05-14 MED ORDER — TORSEMIDE 20 MG PO TABS
30.0000 mg | ORAL_TABLET | Freq: Every day | ORAL | Status: DC
  Administered 2014-05-14 – 2014-05-16 (×3): 30 mg via ORAL
  Filled 2014-05-14 (×3): qty 2

## 2014-05-14 MED ORDER — SODIUM CHLORIDE 0.45 % IV SOLN
INTRAVENOUS | Status: DC
  Administered 2014-05-15: 09:00:00 via INTRAVENOUS

## 2014-05-14 NOTE — ED Notes (Signed)
Per EMS patient had been vomiting and it was blood tinged. Pt states the first time she threw up was this morning and it was red.   CBG: 135

## 2014-05-14 NOTE — ED Notes (Signed)
Hemoccult not crossing over into chart - grossly positive hemoccult noted on card.  edp notified.

## 2014-05-14 NOTE — H&P (Addendum)
[facility] History and Physical  [name]:[id number] DOB: 01-11-1976 DOA: 05/14/2014  Referring physician: Dr Tomi Bamberger PCP: Default, Provider, MD  Specialists: none  Chief Complaint: Feeling dehydrated and bloody emesis  Assessment/Plan Active Problems: Acute Hepatic Encephalopathy Cirrhosis Leukocytosis Cdiff Colitis Hematemesis   Acute Hepatic Encephalopathy: Ammonia 70 baseline 40. Stopped taking lactulose at time of last discharge. Pt mood is labile and repeatedly states she is not sure about many basic daily details.  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 No need for emergent paracentesis. No sign of SBP. - continue home spironolactone, and torsemide - Continue Mag-ox and Kdur  Hypotension: BP nml on admission.  - continue home midodrine.  - 1/2NS 79ml/hr  Hematemesis: x1 though pt stating that it may have been some of the red velvet cake she ate recently. Stool guiac +. Hgb above baseline - Gastroccult - zofran PRN  CDiff: Previous admission for Cdiff infection and sepsis. Last PO van on 05/07/14. Still w/ diarrhea though improving per pt. WBC 11.4 and afebrile. WBC 15.5 at time of discharge. Unlikely to have active infection.  - Cdiff  PCR (which will most likely be positive. Cannot r/o active infection as there is a high false + rate given recent infection) - stool culture looking actively for Cdiff.  Restart Vanc if needed  Anemia: Hgb 9.4. Baseline around 8.5.  - CBC in am  GERD:  - contiue protonix  DVT Prophylaxis: Hep [place] TID (PLT count 245)  Code Status: FULL Family Communication: None Disposition Plan: pending improvement  HPI: Wanda [name] is a 38 y.o. female came to [facility] ed 05/14/2014 with possible bloody emesis x 1 today. DC from hospital on 9/18 for Cdiff. Last Vanc treatment on 05/07/14. Bowel movements improving but still having  7 per day. Stopped lactulose   Pt called EMS  Review of Systems: Per HPI w/ all other systems negative.   Past Medical History  Diagnosis Date  . GERD (gastroesophageal reflux disease)   . 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 c/w steatohepatitis  . Folate deficiency 09/2013  . Anasarca 10/10/2013  . Hematemesis/vomiting blood 02/24/2014  . Acute blood loss anemia 02/25/2014    Status post transfusion  . Macrocytosis 02/28/2014  . 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 Acute renal failure 09/2013    Pre-renal- resolved  . C. difficile colitis 04/19/2014   Past Surgical History  Procedure Laterality Date  . None    . Paracentesis  Feb 2015    1180 fluid, negative fluid analysis.   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 Paracentesis  10/2013  . Colonoscopy N/A 12/19/2013    SLF:NO OBVIOUS SOURCE FOR ANEMIA IDETIFIED/ONE COLON POLYP REMOVED/Small internal hemorrhoids  . Esophagogastroduodenoscopy N/A 02/11/2014    VEH:[id number] varices with bleeding stigmata-status post esophageal band ligation therapy. Portal gastropathy   Social History:  History   Social History Narrative  . No narrative on file    Allergies  Allergen Reactions  . Lasix [Furosemide]     "doesn't work"    Family History  Problem Relation Age of Onset  . Heart disease Mother   . Colon cancer Neg Hx   . Liver disease Neg Hx  Prior to Admission medications   Medication Sig Start Date End Date Taking? Authorizing Provider  loratadine (CLARITIN) 10 MG tablet Take 1 tablet (10 mg total) by mouth daily. 04/27/14  Yes Rexene Alberts, MD  magnesium oxide (MAG-OX) 400 (241.3 MG) MG tablet Take 1 tablet (400 mg total) by mouth daily. 04/27/14  Yes Rexene Alberts, MD  midodrine (PROAMATINE) 10 MG tablet Take 1 tablet  (10 mg total) by mouth 2 (two) times daily with a meal. Medication to keep your blood pressure from falling. 04/27/14  Yes Rexene Alberts, MD  pantoprazole (PROTONIX) 40 MG tablet Restart this medication 1 tablet 2 times daily after you have completed taking the course of Vancomycin. For management of GI bleeding. 04/27/14   Rexene Alberts, MD  potassium chloride (MICRO-K) 10 MEQ CR capsule Take 10 mEq by mouth 3 (three) times daily. 03/02/14   Historical Provider, MD  spironolactone (ALDACTONE) 25 MG tablet Take 2 tablets once daily until you run out of the 25 mg tablets; then start the 50 mg tablets- 1 tablet daily thereafter. 04/27/14   Rexene Alberts, MD  spironolactone (ALDACTONE) 50 MG tablet Start this 50 mg tablet 1 time daily after you finish your 25 mg tablets. 04/27/14   Rexene Alberts, MD  torsemide (DEMADEX) 10 MG tablet Take 3 tablets (30 mg total) by mouth daily. 04/27/14   Rexene Alberts, MD   Physical Exam: Filed Vitals:   05/14/14 1240 05/14/14 1330 05/14/14 1540  BP: 132/91 123/77 104/83  Pulse: 126  113  Temp: 98.2 F (36.8 C)    TempSrc: Oral    Resp: 16    Height: 5\' 2"  (1.575 m)    SpO2: 99%  99%     General:  Mild distress  Eyes: scleral icterus, EOMI   ENT: mmm,  Neck: FROM  Cardiovascular: RRR, no m/r/g  Respiratory: Nml WOB. No wheezes, ronchi. Good breath sounds throughout  Abdomen: Distended w/ + fluid wave, NABS, nonttp  Skin: jaundiced, warm, well perfused, intact  Musculoskeletal: 1+ LE edema, moves all extremities spontaneously   Psychiatric: labile affect  Neurologic: CN2-12 Grossly intact, cerebellar fxn nml  Labs on Admission:  Basic Metabolic Panel:  Recent Labs Lab 05/14/14 1430  NA 142  K 3.9  CL 101  CO2 23  GLUCOSE 108*  BUN 19  CREATININE 1.03  CALCIUM 9.0   Liver Function Tests:  Recent Labs Lab 05/14/14 1430  AST 122*  ALT 20  ALKPHOS 173*  BILITOT 2.9*  PROT 8.6*  ALBUMIN 2.6*   No results found for this basename:  LIPASE, AMYLASE,  in the last 168 hours  Recent Labs Lab 05/14/14 1430  AMMONIA 70*   CBC:  Recent Labs Lab 05/14/14 1430  WBC 11.4*  NEUTROABS 7.6  HGB 9.4*  HCT 28.0*  MCV 98.6  PLT 245   Cardiac Enzymes: No results found for this basename: CKTOTAL, CKMB, CKMBINDEX, TROPONINI,  in the last 168 hours  BNP (last 3 results) No results found for this basename: PROBNP,  in the last 8760 hours CBG: No results found for this basename: GLUCAP,  in the last 168 hours  Radiological Exams on Admission: No results found.     Time spent: >70 min in direct pt care adn coordination.    Darriona Dehaas, Grayling Congress, MD [facility] [email or web address] Password [email or web address] 05/14/2014, 5:04 PM

## 2014-05-14 NOTE — ED Notes (Signed)
Pt comes in from EMS stating she has had blood in her vomit. She is very anxious and frequently says that people "want to call me a little bitch".  She states, "I don't want you to draw blood." Then follows with how sorry she is.

## 2014-05-14 NOTE — ED Notes (Signed)
Pt is anxious and paranoid. Denies SI/HI or delusions/hallucinations. Pt has erratic thought process. When RN wanted to assess lung sounds, pt states, " the Vancomycin does that. I took 4 doses. I should 10 to 14." When asked how long she has had this cough and when she vomited, she denied she threw up.

## 2014-05-14 NOTE — Progress Notes (Signed)
Notifed Dr. Marily Memos, D. That the patients previous recorded Amonia level was charted in error.  According to the lab they stated the blood drawn was in the wrong tube and that it was re drawn and the correct level was 36 not the 70 that was previously documented.  MD stated to re draw the lab again for confirmation.

## 2014-05-14 NOTE — ED Provider Notes (Signed)
CSN: [id number]     Arrival date & time 05/14/14  1229 History  This chart was scribed for [name], MD by Rayfield Citizen, ED Scribe. This patient was seen in room APA10/APA10 and the patient's care was started at 1:36 PM.    Chief Complaint  Patient presents with  . Hematemesis    Level 5 Caveat for confusion  HPI  HPI Comments: Wanda [name] is a 38 y.o. female who presents to the Emergency Department complaining of bloody vomiting once this morning at 0900. Patient seems to have difficulty answering questions. Patient explains she "rolled over this morning while in bed and vomited, red and white stuff came out." She cannot clarify how much blood she saw.  She explains she has had diarrhea and was recently taking vancomycin for about a month for C. diff. She explains further that she feels dehydrated. She states her diarrhea is improving. However she cannot to me how many times a day she has diarrhea. She is unsure if she is taking lactulose. She notes previous experience with these symptoms one time previously. She denies fever. She has previously had a blood transfusion, she estimates in July. She states she called EMS. She states the police arrived and she does not know why the police were there. She told me she has been in the ER since 9:00 this morning. However it appears she arrived a little after 12:30 this afternoon. Patient keeps saying "I'm dehydrated". She cannot tell me if she's been eating or drinking well recently.  She is no longer a smoker, she does not drink alcohol and lives with her boyfriend.   Her PCP is Dr. Alford Highland working with Dr Sandi Mariscal.  Her GI is [facility].   Past Medical History  Diagnosis Date  . GERD (gastroesophageal reflux disease)   . 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 c/w steatohepatitis  . Folate deficiency 09/2013  . Anasarca 10/10/2013  . Hematemesis/vomiting blood 02/24/2014  . Acute blood loss anemia  02/25/2014    Status post transfusion  . Macrocytosis 02/28/2014  . 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 Acute renal failure 09/2013    Pre-renal- resolved  . C. difficile colitis 04/19/2014   Past Surgical History  Procedure Laterality Date  . None    . Paracentesis  Feb 2015    1180 fluid, negative fluid analysis.   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 Paracentesis  10/2013  . Colonoscopy N/A 12/19/2013    SLF:NO OBVIOUS SOURCE FOR ANEMIA IDETIFIED/ONE COLON POLYP REMOVED/Small internal hemorrhoids  . Esophagogastroduodenoscopy N/A 02/11/2014    JOI:NOMVEHMCNO varices with bleeding stigmata-status post esophageal band ligation therapy. Portal gastropathy   Family History  Problem Relation Age of Onset  . Heart disease Mother   . Colon cancer Neg Hx   . Liver disease Neg Hx    History  Substance Use Topics  . Smoking status: Former Smoker -- 0.25 packs/day for 20 years    Types: Cigarettes    Quit date: 11/05/2008  . Smokeless tobacco: Never Used  . Alcohol Use: No   Lives with boyfriend  OB History   Grav Para Term Preterm Abortions TAB SAB Ect Mult Living   1 1 1       1      Review of Systems  A  complete 10 system review of systems was obtained and all systems are negative except as noted in the HPI and PMH.   Allergies  Lasix  Home Medications   Prior to Admission medications   Medication Sig Start Date End Date Taking? Authorizing Provider  loratadine (CLARITIN) 10 MG tablet Take 1 tablet (10 mg total) by mouth daily. 04/27/14  Yes Rexene Alberts, MD  magnesium oxide (MAG-OX) 400 (241.3 MG) MG tablet Take 1 tablet (400 mg total) by mouth daily. 04/27/14  Yes Rexene Alberts, MD  midodrine (PROAMATINE) 10 MG tablet Take 1 tablet (10 mg total) by mouth 2 (two) times daily with a meal. Medication to keep your blood  pressure from falling. 04/27/14  Yes Rexene Alberts, MD  pantoprazole (PROTONIX) 40 MG tablet Restart this medication 1 tablet 2 times daily after you have completed taking the course of Vancomycin. For management of GI bleeding. 04/27/14   Rexene Alberts, MD  potassium chloride (MICRO-K) 10 MEQ CR capsule Take 10 mEq by mouth 3 (three) times daily. 03/02/14   Historical Provider, MD  spironolactone (ALDACTONE) 25 MG tablet Take 2 tablets once daily until you run out of the 25 mg tablets; then start the 50 mg tablets- 1 tablet daily thereafter. 04/27/14   Rexene Alberts, MD  spironolactone (ALDACTONE) 50 MG tablet Start this 50 mg tablet 1 time daily after you finish your 25 mg tablets. 04/27/14   Rexene Alberts, MD  torsemide (DEMADEX) 10 MG tablet Take 3 tablets (30 mg total) by mouth daily. 04/27/14   Rexene Alberts, MD   BP 132/91  Pulse 126  Temp(Src) 98.2 F (36.8 C) (Oral)  Resp 16  Ht 5\' 2"  (1.575 m)  SpO2 99%  LMP 09/19/2013  Vital signs normal except tachycardia  Physical Exam  Nursing note and vitals reviewed. Constitutional: She is oriented to person, place, and time. She appears well-developed and well-nourished.  Non-toxic appearance. She does not appear ill. No distress.  Patient seems confused. Has trouble giving her history.  HENT:  Head: Normocephalic and atraumatic.  Right Ear: External ear normal.  Left Ear: External ear normal.  Nose: Nose normal. No mucosal edema or rhinorrhea.  Mouth/Throat: Mucous membranes are normal. No dental abscesses or uvula swelling.  Dry tongue, sclera icterus   Eyes: Conjunctivae and EOM are normal. Pupils are equal, round, and reactive to light. Scleral icterus is present.  Neck: Normal range of motion and full passive range of motion without pain. Neck supple.  Cardiovascular: Normal rate, regular rhythm and normal heart sounds.  Exam reveals no gallop and no friction rub.   No murmur heard. Pulmonary/Chest: Effort normal and breath sounds  normal. No respiratory distress. She has no wheezes. She has no rhonchi. She has no rales. She exhibits no tenderness and no crepitus.  Abdominal: Soft. Normal appearance and bowel sounds are normal. She exhibits no distension. There is no tenderness. There is no rebound and no guarding.  Abd: soft, mildly distended, suspect ascites   Musculoskeletal: Normal range of motion. She exhibits no edema and no tenderness.  Moves all extremities well.   Neurological: She is alert and oriented to person, place, and time. She has normal strength. No cranial nerve deficit.  Seems confused  Skin: Skin is warm, dry and intact. No rash noted. No erythema. There is pallor.   Jaundiced in appearance.   Psychiatric: She has a normal mood and affect. Her speech is normal and behavior is normal. Her mood appears not anxious.  ED Course  Procedures  Medications  0.9 %  sodium chloride infusion (0 mLs Intravenous Stopped 05/14/14 1615)    Followed by  0.9 %  sodium chloride infusion (1,000 mLs Intravenous New Bag/Given 05/14/14 1616)     DIAGNOSTIC STUDIES: Oxygen Saturation is 99% on RA, normal by my interpretation.    COORDINATION OF CARE: 1:36 PM Discussed treatment plan with pt at bedside and pt agreed to plan. Nurses report patient refused to have them draw her blood when she first arrived in the ED. However she did say she would allow them to draw blood during my interview.  16:00 Pt given her test results. Although she knows the year, month she still seems confused. We discussed admission which she initially refused then decided she should stay.  Patient may have hepatic encephalopathy from not taking her lactulose due to her diarrhea from the C. difficile. She has been on treatment for about 2 weeks according to her medication record or possibly a month according to patient. Patient had positive C. difficile on 9/3, and 9/12.  17:07 Dr Marily Memos, admit to med-surg, team 1  Labs Review Results for  orders placed during the hospital encounter of 05/14/14  AMMONIA      Result Value Ref Range   Ammonia 70 (*) 11 - 60 umol/L  CBC WITH DIFFERENTIAL      Result Value Ref Range   WBC 11.4 (*) 4.0 - 10.5 K/uL   RBC 2.84 (*) 3.87 - 5.11 MIL/uL   Hemoglobin 9.4 (*) 12.0 - 15.0 g/dL   HCT 28.0 (*) 36.0 - 46.0 %   MCV 98.6  78.0 - 100.0 fL   MCH 33.1  26.0 - 34.0 pg   MCHC 33.6  30.0 - 36.0 g/dL   RDW 19.3 (*) 11.5 - 15.5 %   Platelets 245  150 - 400 K/uL   Neutrophils Relative % 67  43 - 77 %   Neutro Abs 7.6  1.7 - 7.7 K/uL   Lymphocytes Relative 24  12 - 46 %   Lymphs Abs 2.7  0.7 - 4.0 K/uL   Monocytes Relative 6  3 - 12 %   Monocytes Absolute 0.7  0.1 - 1.0 K/uL   Eosinophils Relative 2  0 - 5 %   Eosinophils Absolute 0.2  0.0 - 0.7 K/uL   Basophils Relative 1  0 - 1 %   Basophils Absolute 0.1  0.0 - 0.1 K/uL  COMPREHENSIVE METABOLIC PANEL      Result Value Ref Range   Sodium 142  137 - 147 mEq/L   Potassium 3.9  3.7 - 5.3 mEq/L   Chloride 101  96 - 112 mEq/L   CO2 23  19 - 32 mEq/L   Glucose, Bld 108 (*) 70 - 99 mg/dL   BUN 19  6 - 23 mg/dL   Creatinine, Ser 1.03  0.50 - 1.10 mg/dL   Calcium 9.0  8.4 - 10.5 mg/dL   Total Protein 8.6 (*) 6.0 - 8.3 g/dL   Albumin 2.6 (*) 3.5 - 5.2 g/dL   AST 122 (*) 0 - 37 U/L   ALT 20  0 - 35 U/L   Alkaline Phosphatase 173 (*) 39 - 117 U/L   Total Bilirubin 2.9 (*) 0.3 - 1.2 mg/dL   GFR calc non Af Amer 67 (*) >90 mL/min   GFR calc Af Amer 78 (*) >90 mL/min   Anion gap 18 (*) 5 - 15  APTT  Result Value Ref Range   aPTT 37  24 - 37 seconds  PROTIME-INR      Result Value Ref Range   Prothrombin Time 16.3 (*) 11.6 - 15.2 seconds   INR 1.31  0.00 - 1.49  URINALYSIS, ROUTINE W REFLEX MICROSCOPIC      Result Value Ref Range   Color, Urine AMBER (*) YELLOW   APPearance HAZY (*) CLEAR   Specific Gravity, Urine 1.015  1.005 - 1.030   pH 5.5  5.0 - 8.0   Glucose, UA NEGATIVE  NEGATIVE mg/dL   Hgb urine dipstick NEGATIVE  NEGATIVE    Bilirubin Urine NEGATIVE  NEGATIVE   Ketones, ur NEGATIVE  NEGATIVE mg/dL   Protein, ur NEGATIVE  NEGATIVE mg/dL   Urobilinogen, UA 0.2  0.0 - 1.0 mg/dL   Nitrite NEGATIVE  NEGATIVE   Leukocytes, UA NEGATIVE  NEGATIVE  hemoccult + per nursing staff  Laboratory interpretation all normal except for leukocytosis, stable anemia, elevation of some of her LFTs, metabolic acidosis with anion gap of 18   Imaging Review No results found.   US Paracentesis  04/27/2014   CLINICAL DATA:  Recurrent ascites.  FINDINGS: A total of approximately 2900 mL of serous fluid was removed. A fluid sample was not sent for laboratory analysis.  IMPRESSION: Successful ultrasound guided paracentesis yielding 2900 mL of ascites.   Electronically Signed   By: Logan Bores   On: 04/27/2014 11:57   US Paracentesis  04/23/2014   CLINICAL DATA:  Ascites.d.  COMPLICATIONS: None immediate.  FINDINGS: A total of approximately 4800 mL of serous fluid was removed. A fluid sample was sent for laboratory analysis.  IMPRESSION: Successful ultrasound guided paracentesis yielding 4800 mL of ascites.   Electronically Signed   By: Sabino Dick M.D.   On: 04/23/2014 15:08     EKG Interpretation None      MDM   Final diagnoses:  Altered mental status, unspecified altered mental status type  Hepatic encephalopathy  Hematemesis without nausea  Abnormal liver enzymes    Plan admission   Rolland Porter, MD, FACEP   I personally performed the services described in this documentation, which was scribed in my presence. The recorded information has been reviewed and considered.  Rolland Porter, MD, Abram Sander     [name], MD 05/14/14 2219

## 2014-05-14 NOTE — ED Notes (Signed)
Called to give report, wrong room assignment.

## 2014-05-15 ENCOUNTER — Inpatient Hospital Stay (HOSPITAL_COMMUNITY): Payer: Medicare Other

## 2014-05-15 DIAGNOSIS — K920 Hematemesis: Secondary | ICD-10-CM

## 2014-05-15 DIAGNOSIS — R4182 Altered mental status, unspecified: Secondary | ICD-10-CM

## 2014-05-15 DIAGNOSIS — E440 Moderate protein-calorie malnutrition: Secondary | ICD-10-CM | POA: Insufficient documentation

## 2014-05-15 LAB — CBC
HEMATOCRIT: 23.6 % — AB (ref 36.0–46.0)
Hemoglobin: 7.9 g/dL — ABNORMAL LOW (ref 12.0–15.0)
MCH: 33.1 pg (ref 26.0–34.0)
MCHC: 33.5 g/dL (ref 30.0–36.0)
MCV: 98.7 fL (ref 78.0–100.0)
Platelets: 176 10*3/uL (ref 150–400)
RBC: 2.39 MIL/uL — AB (ref 3.87–5.11)
RDW: 18.9 % — ABNORMAL HIGH (ref 11.5–15.5)
WBC: 6.9 10*3/uL (ref 4.0–10.5)

## 2014-05-15 LAB — COMPREHENSIVE METABOLIC PANEL
ALT: 17 U/L (ref 0–35)
AST: 101 U/L — ABNORMAL HIGH (ref 0–37)
Albumin: 2.4 g/dL — ABNORMAL LOW (ref 3.5–5.2)
Alkaline Phosphatase: 150 U/L — ABNORMAL HIGH (ref 39–117)
Anion gap: 16 — ABNORMAL HIGH (ref 5–15)
BILIRUBIN TOTAL: 4.4 mg/dL — AB (ref 0.3–1.2)
BUN: 17 mg/dL (ref 6–23)
CALCIUM: 8.9 mg/dL (ref 8.4–10.5)
CHLORIDE: 104 meq/L (ref 96–112)
CO2: 22 meq/L (ref 19–32)
CREATININE: 1.01 mg/dL (ref 0.50–1.10)
GFR, EST AFRICAN AMERICAN: 80 mL/min — AB (ref >90)
GFR, EST NON AFRICAN AMERICAN: 69 mL/min — AB (ref >90)
GLUCOSE: 103 mg/dL — AB (ref 70–99)
Potassium: 3.9 mEq/L (ref 3.7–5.3)
Sodium: 142 mEq/L (ref 137–147)
Total Protein: 7.7 g/dL (ref 6.0–8.3)

## 2014-05-15 LAB — OCCULT BLOOD, POC DEVICE: FECAL OCCULT BLD: POSITIVE — AB

## 2014-05-15 LAB — MAGNESIUM: Magnesium: 1.7 mg/dL (ref 1.5–2.5)

## 2014-05-15 LAB — GLUCOSE, CAPILLARY
GLUCOSE-CAPILLARY: 92 mg/dL (ref 70–99)
GLUCOSE-CAPILLARY: 103 mg/dL — AB (ref 70–99)
Glucose-Capillary: 110 mg/dL — ABNORMAL HIGH (ref 70–99)
Glucose-Capillary: 99 mg/dL (ref 70–99)

## 2014-05-15 LAB — CLOSTRIDIUM DIFFICILE BY PCR: CDIFFPCR: NEGATIVE

## 2014-05-15 LAB — PROTIME-INR
INR: 1.48 (ref 0.00–1.49)
PROTHROMBIN TIME: 17.9 s — AB (ref 11.6–15.2)

## 2014-05-15 MED ORDER — BOOST / RESOURCE BREEZE PO LIQD
1.0000 | Freq: Three times a day (TID) | ORAL | Status: DC
  Administered 2014-05-15 – 2014-05-18 (×4): 1 via ORAL

## 2014-05-15 MED ORDER — OXYCODONE HCL 5 MG PO TABS
5.0000 mg | ORAL_TABLET | Freq: Four times a day (QID) | ORAL | Status: DC | PRN
  Administered 2014-05-15 – 2014-05-16 (×2): 5 mg via ORAL
  Filled 2014-05-15 (×2): qty 1

## 2014-05-15 NOTE — Care Management Utilization Note (Signed)
UR completed 

## 2014-05-15 NOTE — Progress Notes (Signed)
[facility] PROGRESS NOTE  Wanda [name] BJS:[id number] DOB: [birth year] DOA: 05/14/2014 PCP: Default, Provider, MD Interim summary: Wanda [name] is a 38 y.o. female came in for for nausea, vomiting and diarrhea and one episode of hematemesis.  Assessment/Plan: Acute Hepatic Encephalopathy:  Improved.  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 No need for emergent paracentesis. No sign of SBP.  - continue home spironolactone, and torsemide  - Continue Mag-ox and Kdur .  - US abdomen ordered.  Hypotension: BP nml on admission.  - continue home midodrine.  Resolved with fluids.  Hematemesis: x1 though pt stating that it may have been some of the red velvet cake she ate recently. Stool guiac +. Hgb above baseline  - Gastroccult  - zofran PRN  Gastroenterology consulted for further recommendations.  Follow CBC.   CDiff: Previous admission for Cdiff infection and sepsis. Last PO van on 05/07/14. Still w/ diarrhea though improving per pt. WBC 11.4 and afebrile. WBC 15.5 at time of discharge. Unlikely to have active infection.  - Cdiff PCR (which will most likely be positive. Cannot r/o active infection as there is a high false + rate given recent infection)  - stool culture looking actively for Cdiff.  Restart Vanc if needed  Anemia: Hgb 9.4.admission, repeat hemoglobin is 7.9 , repeat  CBC in am , transfuse if less than 7.  GERD:  - contiue protonix  DVT Prophylaxis: Hep [place] TID (PLT count 245)  Code Status: FULL  Family Communication: None  Disposition Plan: pending improvement    Consultants:  none  Procedures:  US ABDOMEN pending.   Antibiotics:  none  HPI/Subjective: Comfortable  Objective: Filed Vitals:   05/15/14 0637  BP: 121/72  Pulse: 106  Temp: 98.6 F (37 C)  Resp: 18    Intake/Output Summary (Last 24 hours) at 05/15/14 1547 Last data  filed at 05/15/14 1400  Gross per 24 hour  Intake  607.5 ml  Output      0 ml  Net  607.5 ml   Filed Weights   05/14/14 2028  Weight: 61.689 kg (136 lb)    Exam:   General:  Alert afebrile comfortable  Cardiovascular: s1s2  Respiratory: ctab  Abdomen: soft distended, bowel sounds heard.   Musculoskeletal:  No pedal edema.   Data Reviewed: Basic Metabolic Panel:  Recent Labs Lab 05/14/14 1430 05/15/14 0610  NA 142 142  K 3.9 3.9  CL 101 104  CO2 23 22  GLUCOSE 108* 103*  BUN 19 17  CREATININE 1.03 1.01  CALCIUM 9.0 8.9  MG  --  1.7   Liver Function Tests:  Recent Labs Lab 05/14/14 1430 05/15/14 0610  AST 122* 101*  ALT 20 17  ALKPHOS 173* 150*  BILITOT 2.9* 4.4*  PROT 8.6* 7.7  ALBUMIN 2.6* 2.4*   No results found for this basename: LIPASE, AMYLASE,  in the last 168 hours  Recent Labs Lab 05/14/14 1430 05/14/14 1746 05/14/14 1950  AMMONIA PATIENT IDENTIFICATION ERROR. PLEASE DISREGARD RESULTS. ACCOUNT WILL BE CREDITED. 36 35   CBC:  Recent Labs Lab 05/14/14 1430 05/15/14 0610  WBC 11.4* 6.9  NEUTROABS 7.6  --   HGB 9.4* 7.9*  HCT 28.0* 23.6*  MCV 98.6 98.7  PLT 245 176   Cardiac Enzymes: No results found for this basename: CKTOTAL, CKMB, CKMBINDEX, TROPONINI,  in the last 168 hours BNP (last  3 results) No results found for this basename: PROBNP,  in the last 8760 hours CBG:  Recent Labs Lab 05/14/14 2032 05/15/14 0728 05/15/14 1133  GLUCAP 96 110* 92    No results found for this or any previous visit (from the past 240 hour(s)).   Studies: No results found.  Scheduled Meds: . feeding supplement (RESOURCE BREEZE)  1 Container Oral TID BM  . heparin  5,000 Units Subcutaneous 3 times per day  . lactulose  30 g Oral TID  . magnesium oxide  400 mg Oral Daily  . midodrine  10 mg Oral BID WC  . pantoprazole  40 mg Oral Daily  . potassium chloride  10 mEq Oral TID  . spironolactone  50 mg Oral Daily  . torsemide  30 mg  Oral Daily   Continuous Infusions: . sodium chloride 75 mL/hr at 05/15/14 0906  . sodium chloride Stopped (05/14/14 1738)    Active Problems:   Hepatic encephalopathy   Malnutrition of moderate degree    Time spent: 25 minutes.     [facility] Pager [phone]. If 7PM-7AM, please contact night-coverage at [email or web address], password [facility] 05/15/2014, 3:47 PM  LOS: 1 day

## 2014-05-15 NOTE — Progress Notes (Signed)
INITIAL NUTRITION ASSESSMENT  DOCUMENTATION CODES Per approved criteria  -Non-severe (moderate) malnutrition in the context of chronic illness   Pt meets criteria for moderate MALNUTRITION in the context of chronic illness as evidenced by mild fat and muscle depletion.  INTERVENTION: Resource Breeze po TID, each supplement provides 250 kcal and 9 grams of protein  NUTRITION DIAGNOSIS: Inadequate oral intake related to altered GI function as evidenced by poor po intake PTA, 19% wt loss x 6 months, mild muscle depletion.   Goal: Pt will meet >90% of estimated nutritional needs  Monitor:  PO/suplement intake, labs, weight changes, I/O's  Reason for Assessment: MST=2  38 y.o. female  Admitting Dx: <principal problem not specified>  ASSESSMENT: Wanda [name] is a 38 y.o. female came to [facility] ed 05/14/2014 with possible bloody emesis x 1 today. DC from hospital on 9/18 for Cdiff. Last Vanc treatment on 05/07/14. Bowel movements improving but still having 7 per day. Stopped lactulose  Pt complains of loose stools.  Pt unable to give a reliable diet hx. She is unable to report if she has had weight loss. Documented wt hx reveals a 19% wt loss x 6 months. She reports that her UBW is "somewhere between 140-160#". She reports she does not notice any weight loss and thinks she "weighs a lot". She reveals that she has had a paracentesis done, but unable to tell me when. She is attributing the weight loss due to the paracentsis.  Pt reports frustration that her home diet is limited to certain foods that she is able to tolerate. However, when asked to elaborate further, she would not go into more detail. She reports she ate some jello off her clear liquid tray this morning.  Labs reviewed. Glucose: 103. CBGS: 96-110.  Nutrition Focused Physical Exam:  Subcutaneous Fat:  Orbital Region: WDL Upper Arm Region: mild edepletion Thoracic and Lumbar Region: mild depletion  Muscle:  Temple Region:  WDL Clavicle Bone Region: WDL Clavicle and Acromion Bone Region: mild depletion Scapular Bone Region: mild depletion Dorsal Hand: mild depletion Patellar Region: mild depletion Anterior Thigh Region: mild depletion Posterior Calf Region: mild depletion  Edema: none present  Height: Ht Readings from Last 1 Encounters:  05/14/14 5\' 2"  (1.575 m)    Weight: Wt Readings from Last 1 Encounters:  05/14/14 136 lb (61.689 kg)    Ideal Body Weight: 110#  % Ideal Body Weight: 124%  Wt Readings from Last 50 Encounters:  05/14/14 136 lb (61.689 kg)  04/25/14 136 lb 0.4 oz (61.7 kg)  04/17/14 135 lb (61.236 kg)  04/12/14 134 lb (60.782 kg)  03/01/14 149 lb 14.6 oz (68 kg)  03/01/14 149 lb 14.6 oz (68 kg)  12/19/13 145 lb (65.772 kg)  12/19/13 145 lb (65.772 kg)  12/05/13 134 lb 9.6 oz (61.054 kg)  11/23/13 160 lb (72.576 kg)  11/17/13 168 lb 14 oz (76.6 kg)  11/17/13 168 lb 14 oz (76.6 kg)  10/12/13 172 lb (78.019 kg)  09/11/13 177 lb 11.2 oz (80.604 kg)   Usual Body Weight: 170#  % Usual Body Weight: 80%  BMI:  Body mass index is 24.87 kg/(m^2). Normal weight range  Estimated Nutritional Needs: Kcal: 4818-5631 Protein: 74-84 grams Fluid: 1.7-2.0 L  Skin: Intact  Diet Order: Clear Liquid  EDUCATION NEEDS: -Education needs addressed   Intake/Output Summary (Last 24 hours) at 05/15/14 1009 Last data filed at 05/14/14 2313  Gross per 24 hour  Intake    240 ml  Output  0 ml  Net    240 ml    Last BM: 05/14/14  Labs:   Recent Labs Lab 05/14/14 1430 05/15/14 0610  NA 142 142  K 3.9 3.9  CL 101 104  CO2 23 22  BUN 19 17  CREATININE 1.03 1.01  CALCIUM 9.0 8.9  MG  --  1.7  GLUCOSE 108* 103*    CBG (last 3)   Recent Labs  05/14/14 2032 05/15/14 0728  GLUCAP 96 110*    Scheduled Meds: . heparin  5,000 Units Subcutaneous 3 times per day  . lactulose  30 g Oral TID  . magnesium oxide  400 mg Oral Daily  . midodrine  10 mg Oral BID WC  .  pantoprazole  40 mg Oral Daily  . potassium chloride  10 mEq Oral TID  . spironolactone  50 mg Oral Daily  . torsemide  30 mg Oral Daily    Continuous Infusions: . sodium chloride 75 mL/hr at 05/15/14 0906  . sodium chloride Stopped (05/14/14 1738)    Past Medical History  Diagnosis Date  . GERD (gastroesophageal reflux disease)   . 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 c/w steatohepatitis  . Folate deficiency 09/2013  . Anasarca 10/10/2013  . Hematemesis/vomiting blood 02/24/2014  . Acute blood loss anemia 02/25/2014    Status post transfusion  . Macrocytosis 02/28/2014  . 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 Acute renal failure 09/2013    Pre-renal- resolved  . C. difficile colitis 04/19/2014    Past Surgical History  Procedure Laterality Date  . None    . Paracentesis  Feb 2015    1180 fluid, negative fluid analysis.   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 Paracentesis  10/2013  . Colonoscopy N/A 12/19/2013    SLF:NO OBVIOUS SOURCE FOR ANEMIA IDETIFIED/ONE COLON POLYP REMOVED/Small internal hemorrhoids  . Esophagogastroduodenoscopy N/A 02/11/2014    WHQ:[id number] varices with bleeding stigmata-status post esophageal band ligation therapy. Portal gastropathy    Amalea Ottey A. Jimmye Norman, RD, LDN Pager: [phone]

## 2014-05-16 ENCOUNTER — Inpatient Hospital Stay (HOSPITAL_COMMUNITY): Payer: Medicare Other

## 2014-05-16 DIAGNOSIS — R188 Other ascites: Secondary | ICD-10-CM

## 2014-05-16 DIAGNOSIS — K7290 Hepatic failure, unspecified without coma: Principal | ICD-10-CM

## 2014-05-16 DIAGNOSIS — A047 Enterocolitis due to Clostridium difficile: Secondary | ICD-10-CM

## 2014-05-16 LAB — COMPREHENSIVE METABOLIC PANEL
ALT: 15 U/L (ref 0–35)
AST: 83 U/L — ABNORMAL HIGH (ref 0–37)
Albumin: 2.5 g/dL — ABNORMAL LOW (ref 3.5–5.2)
Alkaline Phosphatase: 142 U/L — ABNORMAL HIGH (ref 39–117)
Anion gap: 15 (ref 5–15)
BILIRUBIN TOTAL: 5.7 mg/dL — AB (ref 0.3–1.2)
BUN: 18 mg/dL (ref 6–23)
CO2: 24 meq/L (ref 19–32)
CREATININE: 1.38 mg/dL — AB (ref 0.50–1.10)
Calcium: 9.2 mg/dL (ref 8.4–10.5)
Chloride: 101 mEq/L (ref 96–112)
GFR calc Af Amer: 55 mL/min — ABNORMAL LOW (ref >90)
GFR, EST NON AFRICAN AMERICAN: 47 mL/min — AB (ref >90)
GLUCOSE: 94 mg/dL (ref 70–99)
Potassium: 4 mEq/L (ref 3.7–5.3)
Sodium: 140 mEq/L (ref 137–147)
Total Protein: 7.5 g/dL (ref 6.0–8.3)

## 2014-05-16 LAB — PROTEIN, BODY FLUID: TOTAL PROTEIN, FLUID: 2 g/dL

## 2014-05-16 LAB — CBC
HCT: 24.5 % — ABNORMAL LOW (ref 36.0–46.0)
Hemoglobin: 8 g/dL — ABNORMAL LOW (ref 12.0–15.0)
MCH: 32.9 pg (ref 26.0–34.0)
MCHC: 32.7 g/dL (ref 30.0–36.0)
MCV: 100.8 fL — AB (ref 78.0–100.0)
PLATELETS: 155 10*3/uL (ref 150–400)
RBC: 2.43 MIL/uL — ABNORMAL LOW (ref 3.87–5.11)
RDW: 18.5 % — AB (ref 11.5–15.5)
WBC: 6.4 10*3/uL (ref 4.0–10.5)

## 2014-05-16 LAB — BODY FLUID CELL COUNT WITH DIFFERENTIAL
Eos, Fluid: 0 %
Lymphs, Fluid: 11 %
Monocyte-Macrophage-Serous Fluid: 88 % (ref 50–90)
Neutrophil Count, Fluid: 1 % (ref 0–25)
Total Nucleated Cell Count, Fluid: 149 cu mm (ref 0–1000)

## 2014-05-16 LAB — LACTATE DEHYDROGENASE, PLEURAL OR PERITONEAL FLUID: LD, Fluid: 45 U/L — ABNORMAL HIGH (ref 3–23)

## 2014-05-16 LAB — GLUCOSE, SEROUS FLUID: GLUCOSE FL: 95 mg/dL

## 2014-05-16 LAB — AMMONIA: Ammonia: 47 umol/L (ref 11–60)

## 2014-05-16 MED ORDER — SODIUM CHLORIDE 0.9 % IV SOLN
INTRAVENOUS | Status: DC
  Administered 2014-05-16 – 2014-05-18 (×3): via INTRAVENOUS

## 2014-05-16 MED ORDER — SODIUM CHLORIDE 0.9 % IJ SOLN
3.0000 mL | Freq: Two times a day (BID) | INTRAMUSCULAR | Status: DC
  Administered 2014-05-16: 3 mL via INTRAVENOUS

## 2014-05-16 MED ORDER — OXYCODONE HCL 5 MG PO TABS
5.0000 mg | ORAL_TABLET | Freq: Four times a day (QID) | ORAL | Status: DC | PRN
  Administered 2014-05-16 – 2014-05-18 (×5): 5 mg via ORAL
  Filled 2014-05-16 (×6): qty 1

## 2014-05-16 MED ORDER — SODIUM CHLORIDE 0.9 % IJ SOLN
3.0000 mL | INTRAMUSCULAR | Status: DC | PRN
Start: 2014-05-16 — End: 2014-05-18

## 2014-05-16 MED ORDER — SODIUM CHLORIDE 0.9 % IV SOLN: 250.0000 mL | INTRAVENOUS | Status: DC

## 2014-05-16 MED ORDER — SODIUM CHLORIDE 0.9 % IV SOLN: 250.0000 mL | INTRAVENOUS | Status: DC | PRN

## 2014-05-16 MED ORDER — LOPERAMIDE HCL 2 MG PO CAPS
2.0000 mg | ORAL_CAPSULE | Freq: Two times a day (BID) | ORAL | Status: DC | PRN
  Administered 2014-05-16 – 2014-05-17 (×2): 2 mg via ORAL
  Filled 2014-05-16 (×2): qty 1

## 2014-05-16 MED ORDER — METRONIDAZOLE 500 MG PO TABS
500.0000 mg | ORAL_TABLET | Freq: Three times a day (TID) | ORAL | Status: DC
  Administered 2014-05-16 – 2014-05-18 (×7): 500 mg via ORAL
  Filled 2014-05-16 (×7): qty 1

## 2014-05-16 NOTE — Consult Note (Addendum)
Reason for Consult: Hepatic encephalopathy Referring Physician: Hospitalist services Stool guaiac positive in the ED.  Wanda [name] is an 38 y.o. female.  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 No prior hx of etoh abuse. HPI: Admitted thru the ED Monday. She says she was dehydrated. She was having multiple bowel movements.  She says everytime she ate or drank anything she would have a BM.  Recent hx of c-diff.  C-diff 05/15/2014 negative. Had been taking Vancomycin for the C-diff.  She stopped the Lactulose her last discharge date 04/27/2014. She tells me Monday she ? vomited blood. She also tells me she had just eaten a small portion of red velvet cake before vomiting.   Stools was positive in the ED for blood.  Her BMs are yellow and watery.  She says she feels somewhat better.  Responds to questions appropriately. Ammonia level normal today.  Hemoglobin 8.0 today.  EGD with banding 02/25/2014 Controlled therapy/banding ligation esophageal. Portal gastropathy. EGD with Banding 11/24/2013: 1 column very small varices in distal esophagus. Moderate portal gastropathy in proximal stomach. Moderate erosive gastritis.   Has undergone multiple paracentesis this year. Most recent paracentesis this am.  Hx of NAFLD. Followed by Dr Rourk/Fields. Hep. A, B, C, markers negative.   Past Medical History  Diagnosis Date  . GERD (gastroesophageal reflux disease)   . 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 c/w steatohepatitis  . Folate deficiency 09/2013  . Anasarca 10/10/2013  . Hematemesis/vomiting blood 02/24/2014  . Acute blood loss anemia 02/25/2014    Status post transfusion  . Macrocytosis 02/28/2014  . 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 Acute renal failure 09/2013    Pre-renal- resolved  . C. difficile colitis 04/19/2014    Past Surgical History  Procedure Laterality Date  . None    . Paracentesis  Feb 2015    1180 fluid, negative fluid  analysis.   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 Paracentesis  10/2013  . Colonoscopy N/A 12/19/2013    SLF:NO OBVIOUS SOURCE FOR ANEMIA IDETIFIED/ONE COLON POLYP REMOVED/Small internal hemorrhoids  . Esophagogastroduodenoscopy N/A 02/11/2014    NID:[id number] varices with bleeding stigmata-status post esophageal band ligation therapy. Portal gastropathy    Family History  Problem Relation Age of Onset  . Heart disease Mother   . Colon cancer Neg Hx   . Liver disease Neg Hx     Social History:  reports that she quit smoking about 5 years ago. Her smoking use included Cigarettes. She has a 5 pack-year smoking history. She has never used smokeless tobacco. She reports that she does not drink alcohol or use illicit drugs.  Allergies:  Allergies  Allergen Reactions  . Lasix [Furosemide]     "doesn't work"    Medications: I have reviewed the patient's current medications.  Results for orders placed during the hospital encounter of 05/14/14 (from the past 48 hour(s))  URINALYSIS, ROUTINE W REFLEX MICROSCOPIC     Status: Abnormal   Collection Time    05/14/14  1:38 PM      Result Value Ref Range   Color, Urine AMBER (*) YELLOW   Comment: BIOCHEMICALS MAY BE AFFECTED BY COLOR   APPearance HAZY (*) CLEAR   Specific Gravity, Urine 1.015  1.005 - 1.030   pH 5.5  5.0 - 8.0  Glucose, UA NEGATIVE  NEGATIVE mg/dL   Hgb urine dipstick NEGATIVE  NEGATIVE   Bilirubin Urine NEGATIVE  NEGATIVE   Ketones, ur NEGATIVE  NEGATIVE mg/dL   Protein, ur NEGATIVE  NEGATIVE mg/dL   Urobilinogen, UA 0.2  0.0 - 1.0 mg/dL   Nitrite NEGATIVE  NEGATIVE   Leukocytes, UA NEGATIVE  NEGATIVE   Comment: MICROSCOPIC NOT DONE ON URINES WITH NEGATIVE PROTEIN, BLOOD, LEUKOCYTES, NITRITE, OR GLUCOSE <1000 mg/dL.  URINE RAPID DRUG SCREEN (HOSP PERFORMED)     Status: None    Collection Time    05/14/14  1:38 PM      Result Value Ref Range   Opiates NONE DETECTED  NONE DETECTED   Cocaine NONE DETECTED  NONE DETECTED   Benzodiazepines NONE DETECTED  NONE DETECTED   Amphetamines NONE DETECTED  NONE DETECTED   Tetrahydrocannabinol NONE DETECTED  NONE DETECTED   Barbiturates NONE DETECTED  NONE DETECTED   Comment:            DRUG SCREEN FOR MEDICAL PURPOSES     ONLY.  IF CONFIRMATION IS NEEDED     FOR ANY PURPOSE, NOTIFY LAB     WITHIN 5 DAYS.                LOWEST DETECTABLE LIMITS     FOR URINE DRUG SCREEN     Drug Class       Cutoff (ng/mL)     Amphetamine      1000     Barbiturate      200     Benzodiazepine   782     Tricyclics       423     Opiates          300     Cocaine          300     THC              50  AMMONIA     Status: None   Collection Time    05/14/14  2:30 PM      Result Value Ref Range   Ammonia    11 - 60 umol/L   Value: PATIENT IDENTIFICATION ERROR. PLEASE DISREGARD RESULTS. ACCOUNT WILL BE CREDITED.   Comment: Results Called to:     WATKINS T. AT 1822 ON [id number] BY THOMPSON S.     CORRECTED ON 10/05 AT 1820: PREVIOUSLY REPORTED AS 70  CBC WITH DIFFERENTIAL     Status: Abnormal   Collection Time    05/14/14  2:30 PM      Result Value Ref Range   WBC 11.4 (*) 4.0 - 10.5 K/uL   RBC 2.84 (*) 3.87 - 5.11 MIL/uL   Hemoglobin 9.4 (*) 12.0 - 15.0 g/dL   HCT 28.0 (*) 36.0 - 46.0 %   MCV 98.6  78.0 - 100.0 fL   MCH 33.1  26.0 - 34.0 pg   MCHC 33.6  30.0 - 36.0 g/dL   RDW 19.3 (*) 11.5 - 15.5 %   Platelets 245  150 - 400 K/uL   Neutrophils Relative % 67  43 - 77 %   Neutro Abs 7.6  1.7 - 7.7 K/uL   Lymphocytes Relative 24  12 - 46 %   Lymphs Abs 2.7  0.7 - 4.0 K/uL   Monocytes Relative 6  3 - 12 %   Monocytes Absolute 0.7  0.1 - 1.0 K/uL   Eosinophils Relative 2  0 - 5 %  Eosinophils Absolute 0.2  0.0 - 0.7 K/uL   Basophils Relative 1  0 - 1 %   Basophils Absolute 0.1  0.0 - 0.1 K/uL  COMPREHENSIVE METABOLIC PANEL      Status: Abnormal   Collection Time    05/14/14  2:30 PM      Result Value Ref Range   Sodium 142  137 - 147 mEq/L   Potassium 3.9  3.7 - 5.3 mEq/L   Chloride 101  96 - 112 mEq/L   CO2 23  19 - 32 mEq/L   Glucose, Bld 108 (*) 70 - 99 mg/dL   BUN 19  6 - 23 mg/dL   Creatinine, Ser 1.03  0.50 - 1.10 mg/dL   Calcium 9.0  8.4 - 10.5 mg/dL   Total Protein 8.6 (*) 6.0 - 8.3 g/dL   Albumin 2.6 (*) 3.5 - 5.2 g/dL   AST 122 (*) 0 - 37 U/L   ALT 20  0 - 35 U/L   Alkaline Phosphatase 173 (*) 39 - 117 U/L   Total Bilirubin 2.9 (*) 0.3 - 1.2 mg/dL   GFR calc non Af Amer 67 (*) >90 mL/min   GFR calc Af Amer 78 (*) >90 mL/min   Comment: (NOTE)     The eGFR has been calculated using the CKD EPI equation.     This calculation has not been validated in all clinical situations.     eGFR's persistently <90 mL/min signify possible Chronic Kidney     Disease.   Anion gap 18 (*) 5 - 15  APTT     Status: None   Collection Time    05/14/14  2:30 PM      Result Value Ref Range   aPTT 37  24 - 37 seconds   Comment:            IF BASELINE aPTT IS ELEVATED,     SUGGEST PATIENT RISK ASSESSMENT     BE USED TO DETERMINE APPROPRIATE     ANTICOAGULANT THERAPY.  PROTIME-INR     Status: Abnormal   Collection Time    05/14/14  2:30 PM      Result Value Ref Range   Prothrombin Time 16.3 (*) 11.6 - 15.2 seconds   INR 1.31  0.00 - 1.49  OCCULT BLOOD, POC DEVICE     Status: Abnormal   Collection Time    05/14/14  3:27 PM      Result Value Ref Range   Fecal Occult Bld POSITIVE (*) NEGATIVE  AMMONIA     Status: None   Collection Time    05/14/14  5:46 PM      Result Value Ref Range   Ammonia 36  11 - 60 umol/L  SAMPLE TO BLOOD BANK     Status: None   Collection Time    05/14/14  5:46 PM      Result Value Ref Range   Blood Bank Specimen SAMPLE AVAILABLE FOR TESTING     Sample Expiration 05/17/2014    AMMONIA     Status: None   Collection Time    05/14/14  7:50 PM      Result Value Ref Range    Ammonia 35  11 - 60 umol/L  GLUCOSE, CAPILLARY     Status: None   Collection Time    05/14/14  8:32 PM      Result Value Ref Range   Glucose-Capillary 96  70 - 99 mg/dL   Comment 1 Notify  RN    MAGNESIUM     Status: None   Collection Time    05/15/14  6:10 AM      Result Value Ref Range   Magnesium 1.7  1.5 - 2.5 mg/dL  CBC     Status: Abnormal   Collection Time    05/15/14  6:10 AM      Result Value Ref Range   WBC 6.9  4.0 - 10.5 K/uL   RBC 2.39 (*) 3.87 - 5.11 MIL/uL   Hemoglobin 7.9 (*) 12.0 - 15.0 g/dL   HCT 23.6 (*) 36.0 - 46.0 %   MCV 98.7  78.0 - 100.0 fL   MCH 33.1  26.0 - 34.0 pg   MCHC 33.5  30.0 - 36.0 g/dL   RDW 18.9 (*) 11.5 - 15.5 %   Platelets 176  150 - 400 K/uL   Comment: DELTA CHECK NOTED  COMPREHENSIVE METABOLIC PANEL     Status: Abnormal   Collection Time    05/15/14  6:10 AM      Result Value Ref Range   Sodium 142  137 - 147 mEq/L   Potassium 3.9  3.7 - 5.3 mEq/L   Chloride 104  96 - 112 mEq/L   CO2 22  19 - 32 mEq/L   Glucose, Bld 103 (*) 70 - 99 mg/dL   BUN 17  6 - 23 mg/dL   Creatinine, Ser 1.01  0.50 - 1.10 mg/dL   Calcium 8.9  8.4 - 10.5 mg/dL   Total Protein 7.7  6.0 - 8.3 g/dL   Albumin 2.4 (*) 3.5 - 5.2 g/dL   AST 101 (*) 0 - 37 U/L   ALT 17  0 - 35 U/L   Alkaline Phosphatase 150 (*) 39 - 117 U/L   Total Bilirubin 4.4 (*) 0.3 - 1.2 mg/dL   GFR calc non Af Amer 69 (*) >90 mL/min   GFR calc Af Amer 80 (*) >90 mL/min   Comment: (NOTE)     The eGFR has been calculated using the CKD EPI equation.     This calculation has not been validated in all clinical situations.     eGFR's persistently <90 mL/min signify possible Chronic Kidney     Disease.   Anion gap 16 (*) 5 - 15  PROTIME-INR     Status: Abnormal   Collection Time    05/15/14  6:10 AM      Result Value Ref Range   Prothrombin Time 17.9 (*) 11.6 - 15.2 seconds   INR 1.48  0.00 - 1.49  GLUCOSE, CAPILLARY     Status: Abnormal   Collection Time    05/15/14  7:28 AM      Result  Value Ref Range   Glucose-Capillary 110 (*) 70 - 99 mg/dL   Comment 1 Notify RN     Comment 2 Documented in Chart    GLUCOSE, CAPILLARY     Status: None   Collection Time    05/15/14 11:33 AM      Result Value Ref Range   Glucose-Capillary 92  70 - 99 mg/dL   Comment 1 Notify RN    CLOSTRIDIUM DIFFICILE BY PCR     Status: None   Collection Time    05/15/14 12:58 PM      Result Value Ref Range   C difficile by pcr NEGATIVE  NEGATIVE  GLUCOSE, CAPILLARY     Status: Abnormal   Collection Time    05/15/14  5:04  PM      Result Value Ref Range   Glucose-Capillary 103 (*) 70 - 99 mg/dL   Comment 1 Documented in Chart     Comment 2 Notify RN    GLUCOSE, CAPILLARY     Status: None   Collection Time    05/15/14  9:19 PM      Result Value Ref Range   Glucose-Capillary 99  70 - 99 mg/dL  AMMONIA     Status: None   Collection Time    05/16/14  6:17 AM      Result Value Ref Range   Ammonia 47  11 - 60 umol/L  COMPREHENSIVE METABOLIC PANEL     Status: Abnormal   Collection Time    05/16/14  6:19 AM      Result Value Ref Range   Sodium 140  137 - 147 mEq/L   Potassium 4.0  3.7 - 5.3 mEq/L   Chloride 101  96 - 112 mEq/L   CO2 24  19 - 32 mEq/L   Glucose, Bld 94  70 - 99 mg/dL   BUN 18  6 - 23 mg/dL   Creatinine, Ser 1.38 (*) 0.50 - 1.10 mg/dL   Calcium 9.2  8.4 - 10.5 mg/dL   Total Protein 7.5  6.0 - 8.3 g/dL   Albumin 2.5 (*) 3.5 - 5.2 g/dL   AST 83 (*) 0 - 37 U/L   ALT 15  0 - 35 U/L   Alkaline Phosphatase 142 (*) 39 - 117 U/L   Total Bilirubin 5.7 (*) 0.3 - 1.2 mg/dL   GFR calc non Af Amer 47 (*) >90 mL/min   GFR calc Af Amer 55 (*) >90 mL/min   Comment: (NOTE)     The eGFR has been calculated using the CKD EPI equation.     This calculation has not been validated in all clinical situations.     eGFR's persistently <90 mL/min signify possible Chronic Kidney     Disease.   Anion gap 15  5 - 15  CBC     Status: Abnormal   Collection Time    05/16/14  8:02 AM      Result  Value Ref Range   WBC 6.4  4.0 - 10.5 K/uL   RBC 2.43 (*) 3.87 - 5.11 MIL/uL   Hemoglobin 8.0 (*) 12.0 - 15.0 g/dL   HCT 24.5 (*) 36.0 - 46.0 %   MCV 100.8 (*) 78.0 - 100.0 fL   MCH 32.9  26.0 - 34.0 pg   MCHC 32.7  30.0 - 36.0 g/dL   RDW 18.5 (*) 11.5 - 15.5 %   Platelets 155  150 - 400 K/uL  BODY FLUID CELL COUNT WITH DIFFERENTIAL     Status: None   Collection Time    05/16/14  9:07 AM      Result Value Ref Range   Fluid Type-FCT ASCITIC     Comment: FLUID     CORRECTED ON 10/07 AT 1035: PREVIOUSLY REPORTED AS PARACENTESIS   Color, Fluid YELLOW  YELLOW   Appearance, Fluid HAZY  CLEAR   WBC, Fluid 149  0 - 1000 cu mm   Neutrophil Count, Fluid 1  0 - 25 %   Lymphs, Fluid 11     Monocyte-Macrophage-Serous Fluid 88  50 - 90 %   Eos, Fluid 0     Other Cells, Fluid FEW MESOTHELIAL CELLS    LACTATE DEHYDROGENASE, BODY FLUID     Status: Abnormal  Collection Time    05/16/14  9:07 AM      Result Value Ref Range   LD, Fluid 45 (*) 3 - 23 U/L   Fluid Type-FLDH ASCITIC     Comment: FLUID     CORRECTED ON 10/07 AT 1036: PREVIOUSLY REPORTED AS Peritoneal  PROTEIN, BODY FLUID     Status: None   Collection Time    05/16/14  9:07 AM      Result Value Ref Range   Total protein, fluid 2.0     Comment: NO NORMAL RANGE ESTABLISHED FOR THIS TEST   Fluid Type-FTP ASCITIC     Comment: FLUID     CORRECTED ON 10/07 AT 1037: PREVIOUSLY REPORTED AS Peritoneal  GLUCOSE, SEROUS FLUID     Status: None   Collection Time    05/16/14  9:07 AM      Result Value Ref Range   Glucose, Fluid 95     Comment:            FLUID GLUCOSE LEVELS OF <60     mg/dL OR VALUES OF 40 mg/dL     LESS THAN A SIMULTANEOUS     SERUM LEVEL ARE CONSIDERED     DECREASED.   Fluid Type-FGLU ASCITIC     Comment: FLUID     CORRECTED ON 10/07 AT 1037: PREVIOUSLY REPORTED AS Peritoneal    US Abdomen Limited  05/15/2014   CLINICAL DATA:  Ascites, personal history of cirrhosis, anasarca, macrocytosis, GERD, hepatic  encephalopathy, prior spontaneous bacterial peritonitis  EXAM: LIMITED ABDOMEN ULTRASOUND FOR ASCITES  TECHNIQUE: Limited ultrasound survey for ascites was performed in all four abdominal quadrants.  COMPARISON:  04/27/2014  FINDINGS: Significant ascites identified in all 4 quadrants.  Liver margin appears nodular and rounded on image obtained in the RIGHT upper quadrant.  IMPRESSION: Significant ascites, adequate for paracentesis.   Electronically Signed   By: Lavonia Dana M.D.   On: 05/15/2014 17:09    ROS Blood pressure 110/70, pulse 92, temperature 98.8 F (37.1 C), temperature source Oral, resp. rate 16, height 5' 2"  (1.575 m), weight 136 lb (61.689 kg), last menstrual period 09/19/2013, SpO2 98.00%. Physical Exam Alert. Skin jaundice. Sclera icteric. Lungs clear. HR regular. Abdomen soft. BS+. 1-2 edema to lower extremities.  Assessment/Plan: NAFLD with Ascites: Paracentesis today. Cultures are pending.  Hepatic encephalopathy. Ammonia level is normal. Underwent a paracentesis this am. Cultures are pending. Multiple prior paracentesis in past and no SBP. Guaiac positive stool in the ED. Guaiac negative today. Stool brown.     SETZER,TERRI W 05/16/2014, 11:23 AM   GI attending note; Patient interviewed and examined; Records reviewed. Briefly patient is a 38 year old Caucasian female who presented in February this year but jaundice and diagnosed with cirrhosis. She had liver biopsy in April 2015 confirming this diagnosis. Sclerosis is presumed to be due to non-alcoholic steatohepatitis the patient has no risk for fatty liver disease. Course been complicated by persistent cholestasis, hepatic encephalopathy and she required esophageal varices bleed  in July this year for variceal bleed. She was scheduled to return for repeat banding on 03/23/2014. She canceled the procedure because she did not feel well. Patient was hospitalized from 04/19/2014 through 04/27/2014 for diarrhea secondary to C.  difficile colitis. She took last dose of vancomycin on 05/07/2014 but has remained diarrhea when though she has not been taking lactulose. Patient presented to the emergency room 2 days ago with complaints of hematemesis that she felt dehydrated because of diarrhea. She is  been having at least 7 stools per day. Stools are watery. She denies melena or rectal bleeding. C. difficile by PCR was negative yesterday. Stool culture is pending. She was begun on metronidazole by mouth pending cultures and loperamide 2 mg by mouth twice a day. Patient's exam is pertinent for jaundice she does not have asterixis. Abdomen is full but soft with mild tenderness in right lower quadrant. She has dressing in right low quadrant site of abdominal paracenteses today. She has trace edema around the ankles.  Lab data; From 05/14/2014. WBC 11.4, H&H 9.4 and 28.0 and platelet count 245K Serum Ammonia 36. INR1.31 Serum sodium 142, potassium 3.9, chloride 101, CO2 23, BUN 19, creatinine 1.03 Bilirubin 2.9, BP 173, AST 122, ALT 20, total protein 8.6 albumin 2.6 and globulin 6.0  Lab data from today. WBC 6.4, H&H 8 and 24.5 Serum creatinine 1.38 Probe and 5.7, BP 142, AST 83, ALT 15 and albumin 2.5  Ascitic fluid analysis. WBC 149 and neutrophils 1%. Monocytes 88%  Assessment;  Patient's GI problems can be summarized as follows. #1. Decompensated cirrhosis presumed to be due to non-alcoholic liver disease. However she does not have risk factors for NAFLD.  ALT has been repeatedly normal despite elevation of AST. I am concerned that he may be dealing with alcoholic cirrhosis. Rising bilirubin is of concern. #2. Diarrhea. She was treated for C. difficile 4 weeks ago. She remained diarrhea while on vancomycin even though she did not take lactulose. Repeat C. difficile is negative; stool culture is pending; will look for other causes. Agree with slowing down the frequency with low dose loperamide and empiric therapy with  metronidazole. #3. Patient appears to be mildly dehydrated. Serum creatinine is up.   Recommendations; Normal saline at 50 mL per hour and reassess patient in a.m. Discontinue lactulose which she is refusing to take and does not need it at the present time. Discontinued torsemide and potassium for the time being. Daily weights on standing scale. GI pathogen panel. INR and LFTs with a.m. Lab.  Patient's GI care will be transferred to [facility] starting tomorrow morning.

## 2014-05-16 NOTE — Progress Notes (Signed)
Paracentesis complete no signs of distress. 3600 ml yellow colored ascites removed.  

## 2014-05-16 NOTE — Procedures (Signed)
PreOperative Dx: Cirrhosis, NASH, ascites Postoperative Dx: Cirrhosis, NASH, ascites Procedure:   US guided paracentesis Radiologist:  Thornton Papas Anesthesia:  10 ml of 1% lidocaine Specimen:  3600 ml of yellow ascitic fluid EBL:   < 1 ml Complications: None

## 2014-05-16 NOTE — Progress Notes (Signed)
[facility]                                                                              Patient Demographics  Wanda [name], is a 38 y.o. female, DOB - Sep 23, 1975, XNT:[id number]  Admit date - 05/14/2014   Admitting Physician Waldemar Dickens, MD  Outpatient Primary MD for the patient is Default, Provider, MD  LOS - 2   Chief Complaint  Patient presents with  . Hematemesis      HPI on 05/14/2014 Wanda [name] is a 38 year old female who presented to the ER with possible bloody emesis x 1 episode. Patient was recently discharged from the hospital on 04/27/2014 for C. difficile. Patient was treated with vancomycin orally through 05/07/2014. Patient continues to have several bowel movements, approximately 7 per day. Patient stopped taking her lactulose due to diarrhea.   Assessment & Plan   Acute hepatic encephalopathy -Resolved -Ammonia level 47 -Patient's heart shows was restarted however she refused to to her continuing episodes of diarrhea -Gastroenterology consulted appreciated  -may consider starting xifaxan -C. 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 Difficile -C. difficile was negative on 05/15/2014 -She last completed her vancomycin orally on  05/07/2014 -Patient currently has no leukocytosis -She continues to have several episodes of diarrhea -Will start patient on Imodium twice daily as needed  Normocytic Anemia -Hemoglobin appears stable, baseline approximately 8 -Will continue to monitor CBC and transfuse if hemoglobin drops below 7  GERD -Continue PPI  Code Status: Full  Family Communication: Friend at bedside  Disposition Plan: Admitted  Time Spent in minutes   30 minutes  Procedures  Ultrasound-guided paracentesis  Consults   Gastroenterology  DVT Prophylaxis  heparin  Lab Results  Component Value Date   PLT 155 05/16/2014    Medications  Scheduled Meds: . feeding supplement (RESOURCE BREEZE)  1 Container Oral TID BM  . heparin  5,000 Units Subcutaneous 3 times per day  . lactulose  30 g Oral TID  . magnesium oxide  400 mg Oral Daily  . metroNIDAZOLE  500 mg Oral 3 times per day  . midodrine  10 mg Oral BID WC  . pantoprazole  40 mg Oral Daily  . potassium chloride  10 mEq Oral TID  . spironolactone  50 mg Oral Daily  . torsemide  30 mg Oral Daily   Continuous Infusions: . sodium chloride Stopped (05/14/14 1738)   PRN Meds:.loperamide, morphine injection, ondansetron (ZOFRAN) IV, ondansetron, oxyCODONE  Antibiotics    Anti-infectives   Start     Dose/Rate Route Frequency Ordered Stop  05/16/14 1400  metroNIDAZOLE (FLAGYL) tablet 500 mg     500 mg Oral 3 times per day 05/16/14 1046          Subjective:   Wanda [name] seen and examined today.  Patient continues to complain of diarrhea, several episodes per day. She states it is not changed. She wishes to have something to stop her diarrhea. Patient denies any abdominal pain at this time. She denies any shortness of breath or chest pain.   Objective:   Filed Vitals:   05/15/14 2123 05/16/14 0718 05/16/14 0839 05/16/14 0934  BP: 120/70 114/66 117/76 110/70  Pulse: 93  90 92  Temp: 98.5 F (36.9 C) 98.8 F (37.1 C)    TempSrc:  Oral Oral    Resp: 18  16 16   Height:      Weight:      SpO2: 99% 100% 98% 98%    Wt Readings from Last 3 Encounters:  05/14/14 61.689 kg (136 lb)  04/25/14 61.7 kg (136 lb 0.4 oz)  04/17/14 61.236 kg (135 lb)     Intake/Output Summary (Last 24 hours) at 05/16/14 1056 Last data filed at 05/15/14 1811  Gross per 24 hour  Intake 792.92 ml  Output      0 ml  Net 792.92 ml    Exam  General: Well developed, well nourished, NAD, appears stated age  HEENT: NCAT, mucous membranes moist. Icteric sclera  Cardiovascular: S1 S2 auscultated, no rubs, murmurs or gallops. Regular rate and rhythm.  Respiratory: Clear to auscultation bilaterally with equal chest rise  Abdomen: Soft, nontender, nondistended, + bowel sounds, +fluid wave  Extremities: warm dry without cyanosis clubbing. Trace edema in LE B/L  Neuro: AAOx3, no focal deficits  Psych: Normal affect and demeanor with intact judgement and insight  Skin: Jaundice, no rash  Data Review   Micro Results Recent Results (from the past 240 hour(s))  CLOSTRIDIUM DIFFICILE BY PCR     Status: None   Collection Time    05/15/14 12:58 PM      Result Value Ref Range Status   C difficile by pcr NEGATIVE  NEGATIVE Final    Radiology Reports US Abdomen Limited  05/15/2014   CLINICAL DATA:  Ascites, personal history of cirrhosis, anasarca, macrocytosis, GERD, hepatic encephalopathy, prior spontaneous bacterial peritonitis  EXAM: LIMITED ABDOMEN ULTRASOUND FOR ASCITES  TECHNIQUE: Limited ultrasound survey for ascites was performed in all four abdominal quadrants.  COMPARISON:  04/27/2014  FINDINGS: Significant ascites identified in all 4 quadrants.  Liver margin appears nodular and rounded on image obtained in the RIGHT upper quadrant.  IMPRESSION: Significant ascites, adequate for paracentesis.   Electronically Signed   By: Lavonia Dana M.D.   On: 05/15/2014 17:09   US Paracentesis  04/27/2014   CLINICAL DATA:  Recurrent ascites.   EXAM: ULTRASOUND GUIDED PARACENTESIS  COMPARISON:  04/23/2014  PROCEDURE: An ultrasound guided paracentesis was thoroughly discussed with the patient and questions answered. The benefits, risks, alternatives and complications were also discussed. The patient understands and wishes to proceed with the procedure. Written consent was obtained.  Ultrasound was performed to localize and mark an adequate pocket of fluid in the right lower quadrant quadrant of the abdomen. The area was then prepped and draped in the normal sterile fashion. 1% Lidocaine was used for local anesthesia. Under ultrasound guidance a 5 [place] Yueh catheter was introduced. Paracentesis was performed. The catheter was removed and a dressing applied.  Complications: None.  FINDINGS: A total of  approximately 2900 mL of serous fluid was removed. A fluid sample was not sent for laboratory analysis.  IMPRESSION: Successful ultrasound guided paracentesis yielding 2900 mL of ascites.   Electronically Signed   By: Logan Bores   On: 04/27/2014 11:57   US Paracentesis  04/23/2014   CLINICAL DATA:  Ascites.  EXAM: ULTRASOUND GUIDED PARACENTESIS  COMPARISON:  April 13, 2014.  PROCEDURE: An ultrasound guided paracentesis was thoroughly discussed with the patient and questions answered. The benefits, risks, alternatives and complications were also discussed. The patient understands and wishes to proceed with the procedure. Written consent was obtained.  Ultrasound was performed to localize and mark an adequate pocket of fluid in the right lower quadrant of the abdomen. The area was then prepped and draped in the normal sterile fashion. 1% Lidocaine was used for local anesthesia. Under ultrasound guidance a paracentesis catheter was introduced. Paracentesis was performed. The catheter was removed and a dressing applied.  COMPLICATIONS: None immediate.  FINDINGS: A total of approximately 4800 mL of serous fluid was removed. A fluid sample was sent for  laboratory analysis.  IMPRESSION: Successful ultrasound guided paracentesis yielding 4800 mL of ascites.   Electronically Signed   By: Sabino Dick M.D.   On: 04/23/2014 15:08   Dg Chest Port 1 View  04/25/2014   CLINICAL DATA:  Edema, possible pneumonia  EXAM: PORTABLE CHEST - 1 VIEW  COMPARISON:  04/19/2014  FINDINGS: Cardiomediastinal silhouette is stable. No acute infiltrate or pleural effusion. No pulmonary edema. Bony thorax is unremarkable.  IMPRESSION: No active disease.   Electronically Signed   By: Lahoma Crocker M.D.   On: 04/25/2014 10:05   Dg Abd Acute W/chest  04/19/2014   CLINICAL DATA:  Dehydration and weakness for a week. Fluid in the abdomen.  EXAM: ACUTE ABDOMEN SERIES (ABDOMEN 2 VIEW & CHEST 1 VIEW)  COMPARISON:  Chest 02/25/2014.  Abdomen series 718 2015.  FINDINGS: Shallow inspiration. Atelectasis in the lung bases. Normal heart size and pulmonary vascularity. No focal airspace disease or consolidation in the lungs. No blunting of costophrenic angles. No pneumothorax. Mediastinal contours appear intact.  Abdominal distention with diffuse underpenetration consistent with ascites. Scattered gas and stool in the colon. No small or large bowel distention. No free intra-abdominal air. No abnormal air-fluid levels noted.  IMPRESSION: Shallow inspiration with atelectasis in the lung bases. Nonobstructive bowel gas pattern. Abdominal distention with diffuse underpenetration suggesting ascites.   Electronically Signed   By: Lucienne Capers M.D.   On: 04/19/2014 23:22    CBC  Recent Labs Lab 05/14/14 1430 05/15/14 0610 05/16/14 0802  WBC 11.4* 6.9 6.4  HGB 9.4* 7.9* 8.0*  HCT 28.0* 23.6* 24.5*  PLT 245 176 155  MCV 98.6 98.7 100.8*  MCH 33.1 33.1 32.9  MCHC 33.6 33.5 32.7  RDW 19.3* 18.9* 18.5*  LYMPHSABS 2.7  --   --   MONOABS 0.7  --   --   EOSABS 0.2  --   --   BASOSABS 0.1  --   --     Chemistries   Recent Labs Lab 05/14/14 1430 05/15/14 0610 05/16/14 0619  NA 142  142 140  K 3.9 3.9 4.0  CL 101 104 101  CO2 23 22 24   GLUCOSE 108* 103* 94  BUN 19 17 18   CREATININE 1.03 1.01 1.38*  CALCIUM 9.0 8.9 9.2  MG  --  1.7  --   AST 122* 101* 83*  ALT 20 17 15   ALKPHOS 173*  150* 142*  BILITOT 2.9* 4.4* 5.7*   ------------------------------------------------------------------------------------------------------------------ estimated creatinine clearance is 46.8 ml/min (by C-G formula based on Cr of 1.38). ------------------------------------------------------------------------------------------------------------------ No results found for this basename: HGBA1C,  in the last 72 hours ------------------------------------------------------------------------------------------------------------------ No results found for this basename: CHOL, HDL, LDLCALC, TRIG, CHOLHDL, LDLDIRECT,  in the last 72 hours ------------------------------------------------------------------------------------------------------------------ No results found for this basename: TSH, T4TOTAL, FREET3, T3FREE, THYROIDAB,  in the last 72 hours ------------------------------------------------------------------------------------------------------------------ No results found for this basename: VITAMINB12, FOLATE, FERRITIN, TIBC, IRON, RETICCTPCT,  in the last 72 hours  Coagulation profile  Recent Labs Lab 05/14/14 1430 05/15/14 0610  INR 1.31 1.48    No results found for this basename: DDIMER,  in the last 72 hours  Cardiac Enzymes No results found for this basename: CK, CKMB, TROPONINI, MYOGLOBIN,  in the last 168 hours ------------------------------------------------------------------------------------------------------------------ No components found with this basename: POCBNP,     Dontavious Emily D.O. on 05/16/2014 at 10:56 AM  Between 7am to 7pm - Pager - [phone]  After 7pm go to [email or web address] - password [email or web address]  And look for the night coverage person covering for me  after hours  [facility] Office  [phone]

## 2014-05-16 NOTE — Progress Notes (Signed)
Patient is refusing lactulose. Educated patient. Continues to refuse. Text-paged midlevel to make aware that patient has not received any lactulose since it was restarted on 05/14/14. No orders received at this time.

## 2014-05-16 NOTE — Clinical Documentation Improvement (Signed)
Presents with Hepatic Encephalopathy, NASH, Moderate Malnutrition, Diarrhea.   Upon presentation, patient stated she felt dehydrated  Creatinine has risen from 1.03 on admission to 1.38 with this mornings result  Please provide a diagnosis associated with the above clinical data and document response in next progress note and include in discharge summary if applicable.  Acute Renal Failure/Acute Kidney Injury Acute on Chronic Renal Failure Chronic Renal Failure Other Condition  Thank You, Zoila Shutter ,RN Clinical Documentation Specialist:  [facility] [employer]

## 2014-05-17 DIAGNOSIS — D649 Anemia, unspecified: Secondary | ICD-10-CM

## 2014-05-17 DIAGNOSIS — K7460 Unspecified cirrhosis of liver: Secondary | ICD-10-CM

## 2014-05-17 DIAGNOSIS — R748 Abnormal levels of other serum enzymes: Secondary | ICD-10-CM

## 2014-05-17 DIAGNOSIS — N179 Acute kidney failure, unspecified: Secondary | ICD-10-CM

## 2014-05-17 LAB — BASIC METABOLIC PANEL
ANION GAP: 15 (ref 5–15)
BUN: 21 mg/dL (ref 6–23)
CHLORIDE: 100 meq/L (ref 96–112)
CO2: 23 mEq/L (ref 19–32)
Calcium: 8.8 mg/dL (ref 8.4–10.5)
Creatinine, Ser: 1.74 mg/dL — ABNORMAL HIGH (ref 0.50–1.10)
GFR calc non Af Amer: 36 mL/min — ABNORMAL LOW (ref >90)
GFR, EST AFRICAN AMERICAN: 41 mL/min — AB (ref >90)
Glucose, Bld: 87 mg/dL (ref 70–99)
POTASSIUM: 4.1 meq/L (ref 3.7–5.3)
SODIUM: 138 meq/L (ref 137–147)

## 2014-05-17 LAB — HEPATIC FUNCTION PANEL
ALT: 14 U/L (ref 0–35)
AST: 76 U/L — ABNORMAL HIGH (ref 0–37)
Albumin: 2.5 g/dL — ABNORMAL LOW (ref 3.5–5.2)
Alkaline Phosphatase: 126 U/L — ABNORMAL HIGH (ref 39–117)
BILIRUBIN DIRECT: 2.2 mg/dL — AB (ref 0.0–0.3)
BILIRUBIN INDIRECT: 2.9 mg/dL — AB (ref 0.3–0.9)
Total Bilirubin: 5.1 mg/dL — ABNORMAL HIGH (ref 0.3–1.2)
Total Protein: 7.3 g/dL (ref 6.0–8.3)

## 2014-05-17 LAB — AMMONIA: Ammonia: 30 umol/L (ref 11–60)

## 2014-05-17 LAB — CBC
HCT: 24.2 % — ABNORMAL LOW (ref 36.0–46.0)
Hemoglobin: 8.1 g/dL — ABNORMAL LOW (ref 12.0–15.0)
MCH: 33.6 pg (ref 26.0–34.0)
MCHC: 33.5 g/dL (ref 30.0–36.0)
MCV: 100.4 fL — ABNORMAL HIGH (ref 78.0–100.0)
PLATELETS: 133 10*3/uL — AB (ref 150–400)
RBC: 2.41 MIL/uL — ABNORMAL LOW (ref 3.87–5.11)
RDW: 17.7 % — AB (ref 11.5–15.5)
WBC: 6 10*3/uL (ref 4.0–10.5)

## 2014-05-17 LAB — PROTIME-INR
INR: 1.37 (ref 0.00–1.49)
PROTHROMBIN TIME: 16.9 s — AB (ref 11.6–15.2)

## 2014-05-17 LAB — OCCULT BLOOD X 1 CARD TO LAB, STOOL: Fecal Occult Bld: NEGATIVE

## 2014-05-17 LAB — CLOSTRIDIUM DIFFICILE BY PCR: Toxigenic C. Difficile by PCR: NEGATIVE

## 2014-05-17 NOTE — Progress Notes (Signed)
Subjective:  No abdominal pain. Not that hungry. No BM overnight, just gas. Still denies any history of etoh abuse.  Objective: Vital signs in last 24 hours: Temp:  [98.2 F (36.8 C)-98.7 F (37.1 C)] 98.6 F (37 C) (10/08 0605) Pulse Rate:  [79-92] 79 (10/08 0605) Resp:  [16-18] 18 (10/08 0605) BP: (84-126)/(35-76) 84/35 mmHg (10/08 0605) SpO2:  [98 %-100 %] 98 % (10/08 0605) Weight:  [115 lb 9.6 oz (52.436 kg)-118 lb 9.7 oz (53.8 kg)] 118 lb 9.7 oz (53.8 kg) (10/08 0500) Last BM Date: 05/16/14 General:   Alert,  Chronically ill-appearing, cooperative in NAD Head:  Normocephalic and atraumatic. Eyes:  Sclera clear, no icterus.   Abdomen:  Soft, slightly distended, nontender. Normal bowel sounds, without guarding, and without rebound.   Extremities:  Without clubbing, deformity. 1+ pitting edema just above ankles bilaterally. Neurologic:  Alert and  oriented x4;  grossly normal neurologically. Skin:  Intact without significant lesions or rashes. Psych:  Alert and cooperative. Normal mood and affect.  Intake/Output from previous day: 10/07 0701 - 10/08 0700 In: 777.2 [P.O.:240; I.V.:537.2] Out: -  Intake/Output this shift:    Lab Results: CBC  Recent Labs  05/15/14 0610 05/16/14 0802 05/17/14 0829  WBC 6.9 6.4 6.0  HGB 7.9* 8.0* 8.1*  HCT 23.6* 24.5* 24.2*  MCV 98.7 100.8* 100.4*  PLT 176 155 133*   BMET  Recent Labs  05/15/14 0610 05/16/14 0619 05/17/14 0829  NA 142 140 138  K 3.9 4.0 4.1  CL 104 101 100  CO2 22 24 23   GLUCOSE 103* 94 87  BUN 17 18 21   CREATININE 1.01 1.38* 1.74*  CALCIUM 8.9 9.2 8.8   LFTs  Recent Labs  05/15/14 0610 05/16/14 0619 05/17/14 0829  BILITOT 4.4* 5.7* 5.1*  BILIDIR  --   --  2.2*  IBILI  --   --  2.9*  ALKPHOS 150* 142* 126*  AST 101* 83* 76*  ALT 17 15 14   PROT 7.7 7.5 7.3  ALBUMIN 2.4* 2.5* 2.5*   No results found for this basename: LIPASE,  in the last 72 hours PT/INR  Recent Labs  05/14/14 1430  05/15/14 0610 05/17/14 0829  LABPROT 16.3* 17.9* 16.9*  INR 1.31 1.48 1.37      Imaging Studies: US Abdomen Limited  05/15/2014   CLINICAL DATA:  Ascites, personal history of cirrhosis, anasarca, macrocytosis, GERD, hepatic encephalopathy, prior spontaneous bacterial peritonitis  EXAM: LIMITED ABDOMEN ULTRASOUND FOR ASCITES  TECHNIQUE: Limited ultrasound survey for ascites was performed in all four abdominal quadrants.  COMPARISON:  04/27/2014  FINDINGS: Significant ascites identified in all 4 quadrants.  Liver margin appears nodular and rounded on image obtained in the RIGHT upper quadrant.  IMPRESSION: Significant ascites, adequate for paracentesis.   Electronically Signed   By: Lavonia Dana M.D.   On: 05/15/2014 17:09   US Paracentesis  05/16/2014   CLINICAL DATA:  Cirrhosis, NASH, ascites  EXAM: ULTRASOUND GUIDED DIAGNOSTIC AND THERAPEUTIC PARACENTESIS  COMPARISON:  05/15/2014, 04/27/2014  PROCEDURE: Procedure, benefits, and risks of procedure were discussed with patient.  Written informed consent for procedure was obtained.  Time out protocol followed.  Adequate collection of ascites localized by ultrasound in RIGHT lower quadrant.  Skin prepped and draped in usual sterile fashion.  Skin and soft tissues anesthetized with 10 mL of 1% lidocaine.  5 [place] Yueh catheter placed into peritoneal cavity.  3600 mL of yellow ascitic fluid aspirated by vacuum bottle suction.  Procedure tolerated well  by patient without immediate complication.  FINDINGS: A total of approximately 3600 mL of yellow ascitic fluid was removed. A fluid sample of 180 mL was sent for laboratory analysis.  IMPRESSION: Successful ultrasound guided paracentesis yielding 3600 mL of ascites.   Electronically Signed   By: Lavonia Dana M.D.   On: 05/16/2014 12:04      Assessment: 38 y/o female with decompensated cirrhosis presumed to be NASH (liver bx) although etoh use has been suspected but patient adamantly denies. Recent prolonged  hospitalization for C.Diff, completed vancomycin one week ago but persistent diarrhea. Noted to be heme + in ER, ?single episode of hematemesis prior to arrival to ER. No further vomiting. MELD 22. C.diff PCR negative. GI pathogen panel ordered but not collected.  Her creatinine is up. Urine output unknown. Number of stools decreased.   Plan: 1. Increase fluids some. We will have to watch third spacing. 2. Discussed at length with patient, she is still not interested in pursuing transplant center evaluation.  3. Continue flagyl.    LOS: 3 days   Neil Crouch  05/17/2014, 8:03 AM  Attending note:  Patient seen and examined. Recurrent diarrhea. Additional stool studies pending. I have recommended she consider undergo a hepatology evaluation at a tertiary referral Center. She continues to be evasive with her responses.

## 2014-05-17 NOTE — Progress Notes (Signed)
[facility]                                                                              Patient Demographics  Wanda [name], is a 38 y.o. female, DOB - 06/27/1976, VVO:[id number]  Admit date - 05/14/2014   Admitting Physician Waldemar Dickens, MD  Outpatient Primary MD for the patient is Default, Provider, MD  LOS - 3   Chief Complaint  Patient presents with  . Hematemesis      HPI on 05/14/2014 Wanda [name] is a 38 year old female who presented to the ER with possible bloody emesis x 1 episode. Patient was recently discharged from the hospital on 04/27/2014 for C. difficile. Patient was treated with vancomycin orally through 05/07/2014. Patient continues to have several bowel movements, approximately 7 per day. Patient stopped taking her lactulose due to diarrhea.   Assessment & Plan   Acute hepatic encephalopathy -Resolved -Ammonia level 30 -Patient's heart shows was restarted however she refused to to her continuing episodes of diarrhea -Gastroenterology consulted appreciated  -may consider starting xifaxan -C. 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, 33 on previous admission -Patient does not have any clinical signs of SBP -Continue aspirin, spironolactone and furosemide as well as magnesium and potassium supplementation -Abdominal ultrasound: Significant ascites adequate for paracentesis -Patient did have US paracentesis, with 3.6L fluid removal -Body fluid culture shows rare WBC present, no organisms seen -LFTs trending downward, however bilirubin 5.1 -GI consulted and appreciated, and spoke with patient regarding transplant, patient does not wish to pursue  Acute on Chronic kidney Disease, Stage 3 -Likely secondary to dehydration from diarrhea vs diuretics used for cirrhosis -Will continue  IVF and to monitor Creatinine  Hypotension -Continue Midodrine  Hematemesis -One episode according to the patient however patient was eating regularly take -Patient seek local was positive -Continue antiemetics as needed -Gastroenterology consulted appreciated  Recent history of C. Difficile -C. difficile was negative on 05/15/2014 -She last completed her vancomycin orally on 05/07/2014 -Patient currently has no leukocytosis -She continues to have several episodes of diarrhea -Continue flagyl and imodium -Pending GI pathogen panel  Normocytic Anemia -Hemoglobin appears stable, baseline approximately 8 -Will continue to monitor CBC and transfuse if hemoglobin drops below 7  GERD -Continue PPI  Code Status: Full  Family Communication: None at bedside  Disposition Plan: Admitted  Time Spent in minutes   30 minutes  Procedures  Ultrasound-guided paracentesis  Consults   Gastroenterology  DVT Prophylaxis  heparin  Lab Results  Component Value Date   PLT 133* 05/17/2014    Medications  Scheduled Meds: . feeding supplement (RESOURCE BREEZE)  1 Container Oral TID BM  . heparin  5,000 Units Subcutaneous 3 times per day  . magnesium oxide  400 mg Oral Daily  . metroNIDAZOLE  500 mg Oral 3 times per day  . midodrine  10 mg Oral BID WC  . pantoprazole  40 mg Oral Daily  . sodium chloride  3 mL Intravenous Q12H  . spironolactone  50 mg Oral Daily  Continuous Infusions: . sodium chloride 50 mL/hr at 05/16/14 1819   PRN Meds:.loperamide, morphine injection, ondansetron (ZOFRAN) IV, ondansetron, oxyCODONE, sodium chloride  Antibiotics    Anti-infectives   Start     Dose/Rate Route Frequency Ordered Stop   05/16/14 1400  metroNIDAZOLE (FLAGYL) tablet 500 mg     500 mg Oral 3 times per day 05/16/14 1046          Subjective:   Wanda [name] seen and examined today.  Patient continues to complain of diarrhea, however, states she has had less episodes.  She  denies nausea, vomiting, abdominal pain today.  She states she does not have much of an appetite.  Denies chest pain, SOB, dizziness, headache.    Objective:   Filed Vitals:   05/16/14 2207 05/17/14 0500 05/17/14 0605 05/17/14 0800  BP: 104/46  84/35 89/40  Pulse: 80  79 85  Temp: 98.7 F (37.1 C)  98.6 F (37 C) 98.6 F (37 C)  TempSrc: Oral  Oral Oral  Resp: 18  18   Height:      Weight:  53.8 kg (118 lb 9.7 oz)    SpO2: 100%  98% 100%    Wt Readings from Last 3 Encounters:  05/17/14 53.8 kg (118 lb 9.7 oz)  04/25/14 61.7 kg (136 lb 0.4 oz)  04/17/14 61.236 kg (135 lb)     Intake/Output Summary (Last 24 hours) at 05/17/14 0947 Last data filed at 05/17/14 0900  Gross per 24 hour  Intake 777.17 ml  Output     75 ml  Net 702.17 ml    Exam  General: Well developed, well nourished, NAD, appears stated age  HEENT: NCAT, mucous membranes moist  Cardiovascular: S1 S2 auscultated, no rubs, murmurs or gallops. Regular rate and rhythm.  Respiratory: Clear to auscultation bilaterally with equal chest rise  Abdomen: Soft, nontender, nondistended, + bowel sounds  Extremities: warm dry without cyanosis clubbing. Trace edema in LE B/L  Neuro: AAOx3, no focal deficits  Psych: Depressed mood and flat affect  Skin: Jaundice, no rash  Data Review   Micro Results Recent Results (from the past 240 hour(s))  CLOSTRIDIUM DIFFICILE BY PCR     Status: None   Collection Time    05/15/14 12:58 PM      Result Value Ref Range Status   C difficile by pcr NEGATIVE  NEGATIVE Final  BODY FLUID CULTURE     Status: None   Collection Time    05/16/14  9:07 AM      Result Value Ref Range Status   Specimen Description ASCITIC FLUID   Final   Special Requests NONE   Final   Gram Stain     Final   Value: RARE WBC PRESENT, PREDOMINANTLY MONONUCLEAR     NO ORGANISMS SEEN     Performed at [facility]   Culture PENDING   Incomplete   Report Status PENDING   Incomplete     Radiology Reports US Abdomen Limited  05/15/2014   CLINICAL DATA:  Ascites, personal history of cirrhosis, anasarca, macrocytosis, GERD, hepatic encephalopathy, prior spontaneous bacterial peritonitis  EXAM: LIMITED ABDOMEN ULTRASOUND FOR ASCITES  TECHNIQUE: Limited ultrasound survey for ascites was performed in all four abdominal quadrants.  COMPARISON:  04/27/2014  FINDINGS: Significant ascites identified in all 4 quadrants.  Liver margin appears nodular and rounded on image obtained in the RIGHT upper quadrant.  IMPRESSION: Significant ascites, adequate for paracentesis.   Electronically Signed   By: Elta Guadeloupe  Thornton Papas M.D.   On: 05/15/2014 17:09   US Paracentesis  04/27/2014   CLINICAL DATA:  Recurrent ascites.  EXAM: ULTRASOUND GUIDED PARACENTESIS  COMPARISON:  04/23/2014  PROCEDURE: An ultrasound guided paracentesis was thoroughly discussed with the patient and questions answered. The benefits, risks, alternatives and complications were also discussed. The patient understands and wishes to proceed with the procedure. Written consent was obtained.  Ultrasound was performed to localize and mark an adequate pocket of fluid in the right lower quadrant quadrant of the abdomen. The area was then prepped and draped in the normal sterile fashion. 1% Lidocaine was used for local anesthesia. Under ultrasound guidance a 5 [place] Yueh catheter was introduced. Paracentesis was performed. The catheter was removed and a dressing applied.  Complications: None.  FINDINGS: A total of approximately 2900 mL of serous fluid was removed. A fluid sample was not sent for laboratory analysis.  IMPRESSION: Successful ultrasound guided paracentesis yielding 2900 mL of ascites.   Electronically Signed   By: Logan Bores   On: 04/27/2014 11:57   US Paracentesis  04/23/2014   CLINICAL DATA:  Ascites.  EXAM: ULTRASOUND GUIDED PARACENTESIS  COMPARISON:  April 13, 2014.  PROCEDURE: An ultrasound guided paracentesis was thoroughly  discussed with the patient and questions answered. The benefits, risks, alternatives and complications were also discussed. The patient understands and wishes to proceed with the procedure. Written consent was obtained.  Ultrasound was performed to localize and mark an adequate pocket of fluid in the right lower quadrant of the abdomen. The area was then prepped and draped in the normal sterile fashion. 1% Lidocaine was used for local anesthesia. Under ultrasound guidance a paracentesis catheter was introduced. Paracentesis was performed. The catheter was removed and a dressing applied.  COMPLICATIONS: None immediate.  FINDINGS: A total of approximately 4800 mL of serous fluid was removed. A fluid sample was sent for laboratory analysis.  IMPRESSION: Successful ultrasound guided paracentesis yielding 4800 mL of ascites.   Electronically Signed   By: Sabino Dick M.D.   On: 04/23/2014 15:08   Dg Chest Port 1 View  04/25/2014   CLINICAL DATA:  Edema, possible pneumonia  EXAM: PORTABLE CHEST - 1 VIEW  COMPARISON:  04/19/2014  FINDINGS: Cardiomediastinal silhouette is stable. No acute infiltrate or pleural effusion. No pulmonary edema. Bony thorax is unremarkable.  IMPRESSION: No active disease.   Electronically Signed   By: Lahoma Crocker M.D.   On: 04/25/2014 10:05   Dg Abd Acute W/chest  04/19/2014   CLINICAL DATA:  Dehydration and weakness for a week. Fluid in the abdomen.  EXAM: ACUTE ABDOMEN SERIES (ABDOMEN 2 VIEW & CHEST 1 VIEW)  COMPARISON:  Chest 02/25/2014.  Abdomen series 718 2015.  FINDINGS: Shallow inspiration. Atelectasis in the lung bases. Normal heart size and pulmonary vascularity. No focal airspace disease or consolidation in the lungs. No blunting of costophrenic angles. No pneumothorax. Mediastinal contours appear intact.  Abdominal distention with diffuse underpenetration consistent with ascites. Scattered gas and stool in the colon. No small or large bowel distention. No free intra-abdominal air.  No abnormal air-fluid levels noted.  IMPRESSION: Shallow inspiration with atelectasis in the lung bases. Nonobstructive bowel gas pattern. Abdominal distention with diffuse underpenetration suggesting ascites.   Electronically Signed   By: Lucienne Capers M.D.   On: 04/19/2014 23:22    CBC  Recent Labs Lab 05/14/14 1430 05/15/14 0610 05/16/14 0802 05/17/14 0829  WBC 11.4* 6.9 6.4 6.0  HGB 9.4* 7.9* 8.0* 8.1*  HCT  28.0* 23.6* 24.5* 24.2*  PLT 245 176 155 133*  MCV 98.6 98.7 100.8* 100.4*  MCH 33.1 33.1 32.9 33.6  MCHC 33.6 33.5 32.7 33.5  RDW 19.3* 18.9* 18.5* 17.7*  LYMPHSABS 2.7  --   --   --   MONOABS 0.7  --   --   --   EOSABS 0.2  --   --   --   BASOSABS 0.1  --   --   --     Chemistries   Recent Labs Lab 05/14/14 1430 05/15/14 0610 05/16/14 0619 05/17/14 0829  NA 142 142 140 138  K 3.9 3.9 4.0 4.1  CL 101 104 101 100  CO2 23 22 24 23   GLUCOSE 108* 103* 94 87  BUN 19 17 18 21   CREATININE 1.03 1.01 1.38* 1.74*  CALCIUM 9.0 8.9 9.2 8.8  MG  --  1.7  --   --   AST 122* 101* 83* 76*  ALT 20 17 15 14   ALKPHOS 173* 150* 142* 126*  BILITOT 2.9* 4.4* 5.7* 5.1*   ------------------------------------------------------------------------------------------------------------------ estimated creatinine clearance is 34 ml/min (by C-G formula based on Cr of 1.74). ------------------------------------------------------------------------------------------------------------------ No results found for this basename: HGBA1C,  in the last 72 hours ------------------------------------------------------------------------------------------------------------------ No results found for this basename: CHOL, HDL, LDLCALC, TRIG, CHOLHDL, LDLDIRECT,  in the last 72 hours ------------------------------------------------------------------------------------------------------------------ No results found for this basename: TSH, T4TOTAL, FREET3, T3FREE, THYROIDAB,  in the last 72  hours ------------------------------------------------------------------------------------------------------------------ No results found for this basename: VITAMINB12, FOLATE, FERRITIN, TIBC, IRON, RETICCTPCT,  in the last 72 hours  Coagulation profile  Recent Labs Lab 05/14/14 1430 05/15/14 0610 05/17/14 0829  INR 1.31 1.48 1.37    No results found for this basename: DDIMER,  in the last 72 hours  Cardiac Enzymes No results found for this basename: CK, CKMB, TROPONINI, MYOGLOBIN,  in the last 168 hours ------------------------------------------------------------------------------------------------------------------ No components found with this basename: POCBNP,     Wanda [name] D.O. on 05/17/2014 at 9:47 AM  Between 7am to 7pm - Pager - [phone]  After 7pm go to [email or web address] - password [email or web address]  And look for the night coverage person covering for me after hours  [facility] Office  [phone]

## 2014-05-18 ENCOUNTER — Telehealth: Payer: Self-pay | Admitting: Gastroenterology

## 2014-05-18 DIAGNOSIS — E440 Moderate protein-calorie malnutrition: Secondary | ICD-10-CM

## 2014-05-18 DIAGNOSIS — E876 Hypokalemia: Secondary | ICD-10-CM

## 2014-05-18 DIAGNOSIS — R197 Diarrhea, unspecified: Secondary | ICD-10-CM

## 2014-05-18 LAB — COMPREHENSIVE METABOLIC PANEL
ALBUMIN: 2.4 g/dL — AB (ref 3.5–5.2)
ALK PHOS: 111 U/L (ref 39–117)
ALT: 14 U/L (ref 0–35)
AST: 62 U/L — AB (ref 0–37)
Anion gap: 15 (ref 5–15)
BILIRUBIN TOTAL: 3.7 mg/dL — AB (ref 0.3–1.2)
BUN: 20 mg/dL (ref 6–23)
CO2: 21 mEq/L (ref 19–32)
CREATININE: 1.64 mg/dL — AB (ref 0.50–1.10)
Calcium: 8.8 mg/dL (ref 8.4–10.5)
Chloride: 102 mEq/L (ref 96–112)
GFR calc Af Amer: 44 mL/min — ABNORMAL LOW (ref >90)
GFR, EST NON AFRICAN AMERICAN: 38 mL/min — AB (ref >90)
GLUCOSE: 95 mg/dL (ref 70–99)
Potassium: 3.6 mEq/L — ABNORMAL LOW (ref 3.7–5.3)
Sodium: 138 mEq/L (ref 137–147)
Total Protein: 7.2 g/dL (ref 6.0–8.3)

## 2014-05-18 LAB — CBC
HEMATOCRIT: 22.7 % — AB (ref 36.0–46.0)
Hemoglobin: 7.5 g/dL — ABNORMAL LOW (ref 12.0–15.0)
MCH: 33.2 pg (ref 26.0–34.0)
MCHC: 33 g/dL (ref 30.0–36.0)
MCV: 100.4 fL — ABNORMAL HIGH (ref 78.0–100.0)
Platelets: 129 10*3/uL — ABNORMAL LOW (ref 150–400)
RBC: 2.26 MIL/uL — ABNORMAL LOW (ref 3.87–5.11)
RDW: 17.4 % — AB (ref 11.5–15.5)
WBC: 5.9 10*3/uL (ref 4.0–10.5)

## 2014-05-18 LAB — GI PATHOGEN PANEL BY PCR, STOOL
C difficile toxin A/B: NEGATIVE
CRYPTOSPORIDIUM BY PCR: NEGATIVE
Campylobacter by PCR: NEGATIVE
E coli (ETEC) LT/ST: NEGATIVE
E coli (STEC): NEGATIVE
E coli 0157 by PCR: NEGATIVE
G LAMBLIA BY PCR: NEGATIVE
Norovirus GI/GII: NEGATIVE
Rotavirus A by PCR: NEGATIVE
SHIGELLA BY PCR: NEGATIVE
Salmonella by PCR: NEGATIVE

## 2014-05-18 MED ORDER — METRONIDAZOLE 500 MG PO TABS: 500.0000 mg | ORAL_TABLET | Freq: Three times a day (TID) | ORAL | Status: DC

## 2014-05-18 MED ORDER — OXYCODONE HCL 10 MG PO TABS: 10.0000 mg | ORAL_TABLET | Freq: Four times a day (QID) | ORAL | Status: DC | PRN

## 2014-05-18 MED ORDER — OXYCODONE HCL 5 MG PO TABS
10.0000 mg | ORAL_TABLET | Freq: Four times a day (QID) | ORAL | Status: DC | PRN
  Administered 2014-05-18: 10 mg via ORAL
  Filled 2014-05-18: qty 2

## 2014-05-18 MED ORDER — BOOST / RESOURCE BREEZE PO LIQD: 1.0000 | Freq: Three times a day (TID) | ORAL | Status: DC

## 2014-05-18 MED ORDER — LOPERAMIDE HCL 2 MG PO CAPS: 2.0000 mg | ORAL_CAPSULE | Freq: Two times a day (BID) | ORAL | Status: DC | PRN

## 2014-05-18 MED ORDER — POTASSIUM CHLORIDE CRYS ER 20 MEQ PO TBCR
40.0000 meq | EXTENDED_RELEASE_TABLET | Freq: Once | ORAL | Status: AC
  Administered 2014-05-18: 40 meq via ORAL
  Filled 2014-05-18: qty 2

## 2014-05-18 NOTE — Progress Notes (Signed)
Subjective:  No abdominal pain. Has spit up some white stuff. "acid". Nausea intermittent. No BM today.   Objective: Vital signs in last 24 hours: Temp:  [98 F (36.7 C)-98.4 F (36.9 C)] 98 F (36.7 C) (10/09 0645) Pulse Rate:  [70-90] 90 (10/09 0645) Resp:  [18] 18 (10/09 0645) BP: (87-102)/(41-48) 102/47 mmHg (10/09 0645) SpO2:  [100 %] 100 % (10/09 0645) Weight:  [117 lb 4.6 oz (53.2 kg)] 117 lb 4.6 oz (53.2 kg) (10/09 0654) Last BM Date: 05/17/14 General:   Alert,  Well-developed, well-nourished, pleasant and cooperative in NAD Head:  Normocephalic and atraumatic. Eyes:  Sclera clear, no icterus.  Abdomen:  Soft, nontender and mildly distended.  Normal bowel sounds, without guarding, and without rebound.   Extremities:  Without clubbing, deformity. 1+ pitting edema just at ankles. Neurologic:  Alert and  oriented x4;  grossly normal neurologically. Skin:  Intact without significant lesions or rashes. Psych:  Alert and cooperative. Normal mood and affect.  Intake/Output from previous day: 10/08 0701 - 10/09 0700 In: 2028.8 [P.O.:360; I.V.:1668.8] Out: 351 [Urine:350; Emesis/NG output:1] Intake/Output this shift: Total I/O In: 120 [P.O.:120] Out: -   Lab Results: CBC  Recent Labs  05/16/14 0802 05/17/14 0829 05/18/14 0500  WBC 6.4 6.0 5.9  HGB 8.0* 8.1* 7.5*  HCT 24.5* 24.2* 22.7*  MCV 100.8* 100.4* 100.4*  PLT 155 133* 129*   BMET  Recent Labs  05/16/14 0619 05/17/14 0829 05/18/14 0500  NA 140 138 138  K 4.0 4.1 3.6*  CL 101 100 102  CO2 24 23 21   GLUCOSE 94 87 95  BUN 18 21 20   CREATININE 1.38* 1.74* 1.64*  CALCIUM 9.2 8.8 8.8   LFTs  Recent Labs  05/16/14 0619 05/17/14 0829 05/18/14 0500  BILITOT 5.7* 5.1* 3.7*  BILIDIR  --  2.2*  --   IBILI  --  2.9*  --   ALKPHOS 142* 126* 111  AST 83* 76* 62*  ALT 15 14 14   PROT 7.5 7.3 7.2  ALBUMIN 2.5* 2.5* 2.4*   No results found for this basename: LIPASE,  in the last 72  hours PT/INR  Recent Labs  05/17/14 0829  LABPROT 16.9*  INR 1.37      Imaging Studies: US Abdomen Limited  05/15/2014   CLINICAL DATA:  Ascites, personal history of cirrhosis, anasarca, macrocytosis, GERD, hepatic encephalopathy, prior spontaneous bacterial peritonitis  EXAM: LIMITED ABDOMEN ULTRASOUND FOR ASCITES  TECHNIQUE: Limited ultrasound survey for ascites was performed in all four abdominal quadrants.  COMPARISON:  04/27/2014  FINDINGS: Significant ascites identified in all 4 quadrants.  Liver margin appears nodular and rounded on image obtained in the RIGHT upper quadrant.  IMPRESSION: Significant ascites, adequate for paracentesis.   Electronically Signed   By: Lavonia Dana M.D.   On: 05/15/2014 17:09   US Paracentesis  05/16/2014   CLINICAL DATA:  Cirrhosis, NASH, ascites  EXAM: ULTRASOUND GUIDED DIAGNOSTIC AND THERAPEUTIC PARACENTESIS  COMPARISON:  05/15/2014, 04/27/2014  PROCEDURE: Procedure, benefits, and risks of procedure were discussed with patient.  Written informed consent for procedure was obtained.  Time out protocol followed.  Adequate collection of ascites localized by ultrasound in RIGHT lower quadrant.  Skin prepped and draped in usual sterile fashion.  Skin and soft tissues anesthetized with 10 mL of 1% lidocaine.  5 [place] Yueh catheter placed into peritoneal cavity.  3600 mL of yellow ascitic fluid aspirated by vacuum bottle suction.  Procedure tolerated well by patient without immediate complication.  FINDINGS: A total of approximately 3600 mL of yellow ascitic fluid was removed. A fluid sample of 180 mL was sent for laboratory analysis.  IMPRESSION: Successful ultrasound guided paracentesis yielding 3600 mL of ascites.   Electronically Signed   By: Lavonia Dana M.D.   On: 05/16/2014 12:04      Assessment: 38 y/o female with decompensated cirrhosis presumed to be NASH (liver bx) although etoh use has been suspected but patient adamantly denies. Recent prolonged  hospitalization for C.Diff, completed vancomycin one week ago but persistent diarrhea. Noted to be heme + in ER, ?single episode of hematemesis prior to arrival to ER. No further vomiting. MELD 22. C.diff PCR negative X2. GI pathogen panel ordered but pending.   Her creatinine is improved today. Urine output unknown. Number of stools decreased. She has not been eating/drinking well. States she doesn't know what she can have. Afraid of getting too much potassium. Provided reassurance.    Plan: 1. Discussed with Dr. Ree Kida. We will plan to follow up with patient in 2 weeks in our office. Complete 10 day course of Flagyl. We will set her up for surveillance EGD for varices at her upcoming OV. 2. Agree with need for labs in one week. Check met-7 and cbc.  3. F/u pending GI pathogen panel.   There will be no GI coverage this weekend.   LOS: 4 days   Neil Crouch  05/18/2014, 12:24 PM

## 2014-05-18 NOTE — Telephone Encounter (Signed)
Please schedule hospital f/u in 2 weeks. Consider setting up EGD for varices surveillance at that time.

## 2014-05-18 NOTE — Care Management Note (Signed)
    Page 1 of 1   05/18/2014     5:22:41 PM CARE MANAGEMENT NOTE 05/18/2014  Patient:  Wanda [name], Wanda [name]   Account Number:  [id number]  Date Initiated:  05/18/2014  Documentation initiated by:  Vladimir Creeks  Subjective/Objective Assessment:   admitted with CHF. Pt is from home, is independent,  and will be returning home at D/C     Action/Plan:   Pt reruses HH. She feels it is not needed.   Anticipated DC Date:  05/18/2014   Anticipated DC Plan:  HOME/SELF CARE         Choice offered to / List presented to:             Status of service:  Completed, signed off Medicare Important Message given?  YES (If response is "NO", the following Medicare IM given date fields will be blank) Date Medicare IM given:  05/18/2014 Medicare IM given by:  Vladimir Creeks Date Additional Medicare IM given:   Additional Medicare IM given by:    Discharge Disposition:  HOME/SELF CARE  Per UR Regulation:  Reviewed for med. necessity/level of care/duration of stay  If discussed at [place] of Stay Meetings, dates discussed:    Comments:  05/18/14 Gila RN/CM

## 2014-05-18 NOTE — Discharge Instructions (Signed)
Hepatic Encephalopathy °Hepatic encephalopathy is a syndrome. This is a set of symptoms that occur together. It is seen mostly in patients with damage to the liver known as cirrhosis. This is where normal liver tissue has been replaced by scar tissue.  °Symptoms of the syndrome include: °· Changes in personality. °· Mental impairment. °· A depressed level of consciousness. °These changes occur because toxins build up in the bloodstream. The build up occurs because the scarred liver cannot rid toxins from the body. The most important of these toxins is ammonia. Toxins can cause abnormal behavior and confusion. Toxins in the blood stream can impair your ability to take care of yourself or others. Some people become very sleepy and cannot be woken easily. In severe cases, the patient lapses into a coma.  °CAUSES  °There are many things that can cause liver damage that can lead to buildup of toxins. These include: °· Diseases that cause cirrhosis of the liver. °· Long-term alcohol use with progressive liver damage. °· Hepatitis B or C with ongoing infection and liver damage. °· Patients without cirrhosis who have undergone shunt surgery. °· Kidney failure. °· Bleeding in the stomach or intestines. °· Infection. °· Constipation. °· Medications that act upon the central nervous system. °· Diuretic therapy. °· Excessive dietary protein. °SYMPTOMS  °Symptoms of this syndrome are categorized or "staged" based on severity.  °· Stage 0. Minimal hepatic encephalopathy. No detectable changes in personality or behavior. Minimal changes in memory, concentration, mental function, and physical ability. °· Stage 1. Some lack of awareness. Shortened attention span. Problems with addition or subtraction. Possible problems with sleeping or a reversal of the normal sleep pattern. Euphoria, depression, or irritability may be present. Mild confusion. Slowing of mental ability. Tremors may be detected. °· Stage 2. Lethargy or apathy.  Disoriented. Strange behavior. Slurred speech. Obvious tremors. Drowsiness, unable to perform mental tasks. Personality changes, and confusion about time. °· Stage 3. Very sleepy but can be aroused. Unable to perform mental tasks, cannot keep track of time and place, marked confusion, amnesia, occasional fits of rage, speech cannot be understood. °· Stage 4. Coma with or without response to painful stimuli. °DIAGNOSIS  °In mild cases, a careful history and physical exam may lead your caregiver to consider possible mild hepatic encephalopathy as the cause of symptoms. The diagnosis is clearer in more severe cases. An elevated blood ammonia level is the classic blood test abnormality in patients with this syndrome. Other tests can be helpful to rule out other diseases.  °TREATMENT  °· Medications are often used to lower the ammonia level in the blood. This usually leads to improvement. °· Diets containing vegetable proteins are better than diets rich in animal protein, especially proteins derived from red meats. Eating well-cooked chicken and fish in addition to vegetable protein should be discussed with your caregiver. Malnourished patients are encouraged to add liquid nutritional supplements to their diet. °· Antibiotics are sometimes used to try to lessen the volume of bacteria in the intestines that produce ammonia. °· Moderate to severe cases of this syndrome usually require a hospital stay and medicine that is given directly into a vein (intravenously). °HOME CARE INSTRUCTIONS  °The goal at home is to avoid things that can make the condition worse and lead to a buildup of ammonia in the blood. °· Eat a well balanced diet. Your caregiver can help you with suggestions on this. °· Talk to your caregiver before taking vitamin supplements. Large doses of vitamins and minerals,   especially vitamin A, iron, or copper, can worsen liver damage. °· A low salt diet, water restriction, or diuretic medicine may be needed to  reduce fluid retention. °· Avoid alcohol and acetaminophen as well as any over-the-counter medications that contain acetaminophen (check labels). Only take over-the-counter or prescription medicines for pain, discomfort, or fever as directed by your caregiver. °· Avoid drugs that are toxic to the liver. Review your medications (both prescription and non-prescription) with your caregiver to make sure those you are taking will not be harmful. °· Blood tests may be needed. Follow your caregiver's advice regarding the timing of these. °· With this condition you play a critical role in maintaining your own good health. The failure to follow your caregiver's advice and these instructions may result in permanent disability or death. °SEEK MEDICAL CARE IF:  °· You have increasing fatigue or weakness. °· You develop increasing swelling of the abdomen, hands, feet, legs or face. °· You develop loss of appetite. °· You are feeling sick to your stomach (nausea) and vomiting. °· You develop jaundice. This is a yellow discoloration of the skin. °· You develop worsening problems with concentration, confusion, and/or problems with sleep. °SEEK IMMEDIATE MEDICAL CARE IF:  °· You vomit bright red blood or a coffee ground-looking material. °· You have blood in your stools. Or the stools turn black and tarry. °· You have a fever. °· You develop easy bruising or bleeding. °· You have a return of slurred speech, change in behavior, or confusion. °MAKE SURE YOU:  °· Understand these instructions. °· Will watch your condition. °· Will get help right away if you are not doing well or get worse. °Document Released: 10/06/2006 Document Revised: 10/19/2011 Document Reviewed: 07/13/2007 °[employer]® Patient Information ©2015 [employer]. This information is not intended to replace advice given to you by your health care provider. Make sure you discuss any questions you have with your health care provider. ° °

## 2014-05-18 NOTE — Progress Notes (Signed)
Please see other progress note for 05/18/14.

## 2014-05-18 NOTE — Progress Notes (Signed)
Patient discharged with instructions, prescription, and care notes.  Verbalized understanding via teach back.  IV was removed and the site was WNL. Patient voiced no further complaints or concerns at the time of discharge.  Appointments scheduled per instructions.  Patient left the floor via w/c with staff and family in stable condition. 

## 2014-05-18 NOTE — Discharge Summary (Signed)
Physician Discharge Summary  Wanda [name] YQM:[id number] DOB: 1976-05-16 DOA: 05/14/2014  PCP: Default, Provider, MD  Admit date: 05/14/2014 Discharge date: 05/18/2014  Time spent: 45 minutes  Recommendations for Outpatient Follow-up:  Patient will be discharged to home. She will need to continue her medications as prescribed. Patient was need to follow up with her primary care physician within one week of discharge and have repeat CBC as well as BMP. Patient should also follow up with Dr. Gala Romney, gastroenterologist, within 2-3 weeks of discharge. Patient should continue a soft bland diet and advance to a heart healthy diet as tolerated. Patient is to resume activity as tolerated.  Discharge Diagnoses:  Acute hepatic encephalopathy Wanda [name] cirrhosis Acute on chronic kidney disease, stage III Hypertension Hematemesis Recent history of C. difficile Normocytic anemia GERD Malnutrition  Discharge Condition: Stable  Diet recommendation: Soft bland diet  Filed Weights   05/16/14 1849 05/17/14 0500 05/18/14 0654  Weight: 52.436 kg (115 lb 9.6 oz) 53.8 kg (118 lb 9.7 oz) 53.2 kg (117 lb 4.6 oz)    History of present illness:  on 05/14/2014  Wanda [name] is a 38 year old female who presented to the ER with possible bloody emesis x 1 episode.  Patient was recently discharged from the hospital on 04/27/2014 for C. difficile. Patient was treated with vancomycin orally through 05/07/2014. Patient continues to have several bowel movements, approximately 7 per day. Patient stopped taking her lactulose due to diarrhea.  Hospital Course:  Acute hepatic encephalopathy  -Resolved  -Ammonia level 30  -lactulose was restarted however she refused to to her continuing episodes of diarrhea  -Gastroenterology consulted appreciated and recommended 10day course of Flagyl and imodium BID with outpatient followup in 2-3 weeks -C. difficile colitis was negative on 05/15/2014   NASH Cirrhosis  -Patient  had a meld score of 21, 33 on previous admission  -Patient does not have any clinical signs of SBP  -Continue aspirin, spironolactone and furosemide as well as magnesium and potassium supplementation  -Abdominal ultrasound: Significant ascites adequate for paracentesis  -Patient did have US paracentesis, with 3.6L fluid removal  -Body fluid culture shows rare WBC present, no organisms seen  -LFTs trending downward -GI consulted and appreciated, and spoke with patient regarding transplant, patient does not wish to pursue   Acute on Chronic kidney Disease, Stage 3  -Likely secondary to dehydration from diarrhea vs diuretics used for cirrhosis  -Creatinine trending downward -Encouraged PO intake -Patient should have repeat BMP within one week of discharge  Hypotension  -Continue Midodrine   Hematemesis  -One episode according to the patient however patient  -Patient fecal occult was positive  -Continue antiemetics as needed  -Gastroenterology consulted appreciated   Recent history of C. Difficile  -C. difficile was negative on 05/15/2014  -She last completed her vancomycin orally on 05/07/2014  -Patient currently has no leukocytosis  -She continues to have diarrhea, however episodes have decreased  -Continue flagyl and imodium  -Pending GI pathogen panel, which can be followed up as an outpatient   Normocytic Anemia  -Hemoglobin appears stable, baseline approximately 8  -Drop to 7.5 likely dilutional as patient was restarted on IVF for AKI -Patient should have repeat CBC in one week of discharge  GERD  -Continue PPI  Malnutrition -Continue feeding supplement  Procedures: Ultrasound-guided paracentesis  Consultations: Gastroenterology  Discharge Exam: Filed Vitals:   05/18/14 0645  BP: 102/47  Pulse: 90  Temp: 98 F (36.7 C)  Resp: 18   Exam  General: Well developed, well nourished, NAD, appears stated age  HEENT: NCAT, mucous membranes moist  Cardiovascular:  S1 S2 auscultated, no rubs, murmurs or gallops. Regular rate and rhythm.  Respiratory: Clear to auscultation bilaterally with equal chest rise  Abdomen: Soft, nontender, nondistended, + bowel sounds  Extremities: warm dry without cyanosis clubbing. Trace edema in LE B/L  Neuro: AAOx3, no focal deficits  Psych: Depressed mood and flat affect  Skin: Jaundice, no rash  Discharge Instructions      Discharge Instructions   Discharge instructions    Complete by:  As directed   Patient will be discharged to home. She will need to continue her medications as prescribed. Patient was need to follow up with her primary care physician within one week of discharge and have repeat CBC as well as BMP. Patient should also follow up with Dr. Gala Romney, gastroenterologist, within 2-3 weeks of discharge. Patient should continue a soft bland diet and advance to a heart healthy diet as tolerated. Patient is to resume activity as tolerated.            Medication List         feeding supplement (RESOURCE BREEZE) Liqd  Take 1 Container by mouth 3 (three) times daily between meals.     loperamide 2 MG capsule  Commonly known as:  IMODIUM  Take 1 capsule (2 mg total) by mouth 2 (two) times daily as needed for diarrhea or loose stools.     loratadine 10 MG tablet  Commonly known as:  CLARITIN  Take 1 tablet (10 mg total) by mouth daily.     magnesium oxide 400 (241.3 MG) MG tablet  Commonly known as:  MAG-OX  Take 1 tablet (400 mg total) by mouth daily.     metroNIDAZOLE 500 MG tablet  Commonly known as:  FLAGYL  Take 1 tablet (500 mg total) by mouth every 8 (eight) hours.     midodrine 10 MG tablet  Commonly known as:  PROAMATINE  Take 1 tablet (10 mg total) by mouth 2 (two) times daily with a meal. Medication to keep your blood pressure from falling.     Oxycodone HCl 10 MG Tabs  Take 1 tablet (10 mg total) by mouth every 6 (six) hours as needed for moderate pain.     pantoprazole 40 MG tablet    Commonly known as:  PROTONIX  Restart this medication 1 tablet 2 times daily after you have completed taking the course of Vancomycin. For management of GI bleeding.     potassium chloride 10 MEQ CR capsule  Commonly known as:  MICRO-K  Take 10 mEq by mouth 3 (three) times daily.     spironolactone 50 MG tablet  Commonly known as:  ALDACTONE  Start this 50 mg tablet 1 time daily after you finish your 25 mg tablets.     torsemide 10 MG tablet  Commonly known as:  DEMADEX  Take 3 tablets (30 mg total) by mouth daily.       Allergies  Allergen Reactions  . Lasix [Furosemide]     "doesn't work"   Follow-up Information   Follow up with Manus Rudd, MD. Schedule an appointment as soon as possible for a visit in 2 weeks. [facility] followup)    Specialty:  Gastroenterology   Contact information:   [address] [place] [address] [phone]       Follow up with PCP. Schedule an appointment as soon as possible for a visit in 1  week. [facility] follow up, repeat CBC and BMP)        The results of significant diagnostics from this hospitalization (including imaging, microbiology, ancillary and laboratory) are listed below for reference.    Significant Diagnostic Studies: US Abdomen Limited  05/15/2014   CLINICAL DATA:  Ascites, personal history of cirrhosis, anasarca, macrocytosis, GERD, hepatic encephalopathy, prior spontaneous bacterial peritonitis  EXAM: LIMITED ABDOMEN ULTRASOUND FOR ASCITES  TECHNIQUE: Limited ultrasound survey for ascites was performed in all four abdominal quadrants.  COMPARISON:  04/27/2014  FINDINGS: Significant ascites identified in all 4 quadrants.  Liver margin appears nodular and rounded on image obtained in the RIGHT upper quadrant.  IMPRESSION: Significant ascites, adequate for paracentesis.   Electronically Signed   By: Lavonia Dana M.D.   On: 05/15/2014 17:09   US Paracentesis  05/16/2014   CLINICAL DATA:  Cirrhosis, NASH, ascites  EXAM:  ULTRASOUND GUIDED DIAGNOSTIC AND THERAPEUTIC PARACENTESIS  COMPARISON:  05/15/2014, 04/27/2014  PROCEDURE: Procedure, benefits, and risks of procedure were discussed with patient.  Written informed consent for procedure was obtained.  Time out protocol followed.  Adequate collection of ascites localized by ultrasound in RIGHT lower quadrant.  Skin prepped and draped in usual sterile fashion.  Skin and soft tissues anesthetized with 10 mL of 1% lidocaine.  5 [place] Yueh catheter placed into peritoneal cavity.  3600 mL of yellow ascitic fluid aspirated by vacuum bottle suction.  Procedure tolerated well by patient without immediate complication.  FINDINGS: A total of approximately 3600 mL of yellow ascitic fluid was removed. A fluid sample of 180 mL was sent for laboratory analysis.  IMPRESSION: Successful ultrasound guided paracentesis yielding 3600 mL of ascites.   Electronically Signed   By: Lavonia Dana M.D.   On: 05/16/2014 12:04   US Paracentesis  04/27/2014   CLINICAL DATA:  Recurrent ascites.  EXAM: ULTRASOUND GUIDED PARACENTESIS  COMPARISON:  04/23/2014  PROCEDURE: An ultrasound guided paracentesis was thoroughly discussed with the patient and questions answered. The benefits, risks, alternatives and complications were also discussed. The patient understands and wishes to proceed with the procedure. Written consent was obtained.  Ultrasound was performed to localize and mark an adequate pocket of fluid in the right lower quadrant quadrant of the abdomen. The area was then prepped and draped in the normal sterile fashion. 1% Lidocaine was used for local anesthesia. Under ultrasound guidance a 5 [place] Yueh catheter was introduced. Paracentesis was performed. The catheter was removed and a dressing applied.  Complications: None.  FINDINGS: A total of approximately 2900 mL of serous fluid was removed. A fluid sample was not sent for laboratory analysis.  IMPRESSION: Successful ultrasound guided paracentesis  yielding 2900 mL of ascites.   Electronically Signed   By: Logan Bores   On: 04/27/2014 11:57   US Paracentesis  04/23/2014   CLINICAL DATA:  Ascites.  EXAM: ULTRASOUND GUIDED PARACENTESIS  COMPARISON:  April 13, 2014.  PROCEDURE: An ultrasound guided paracentesis was thoroughly discussed with the patient and questions answered. The benefits, risks, alternatives and complications were also discussed. The patient understands and wishes to proceed with the procedure. Written consent was obtained.  Ultrasound was performed to localize and mark an adequate pocket of fluid in the right lower quadrant of the abdomen. The area was then prepped and draped in the normal sterile fashion. 1% Lidocaine was used for local anesthesia. Under ultrasound guidance a paracentesis catheter was introduced. Paracentesis was performed. The catheter was removed and a dressing applied.  COMPLICATIONS:  None immediate.  FINDINGS: A total of approximately 4800 mL of serous fluid was removed. A fluid sample was sent for laboratory analysis.  IMPRESSION: Successful ultrasound guided paracentesis yielding 4800 mL of ascites.   Electronically Signed   By: Sabino Dick M.D.   On: 04/23/2014 15:08   Dg Chest Port 1 View  04/25/2014   CLINICAL DATA:  Edema, possible pneumonia  EXAM: PORTABLE CHEST - 1 VIEW  COMPARISON:  04/19/2014  FINDINGS: Cardiomediastinal silhouette is stable. No acute infiltrate or pleural effusion. No pulmonary edema. Bony thorax is unremarkable.  IMPRESSION: No active disease.   Electronically Signed   By: Lahoma Crocker M.D.   On: 04/25/2014 10:05   Dg Abd Acute W/chest  04/19/2014   CLINICAL DATA:  Dehydration and weakness for a week. Fluid in the abdomen.  EXAM: ACUTE ABDOMEN SERIES (ABDOMEN 2 VIEW & CHEST 1 VIEW)  COMPARISON:  Chest 02/25/2014.  Abdomen series 718 2015.  FINDINGS: Shallow inspiration. Atelectasis in the lung bases. Normal heart size and pulmonary vascularity. No focal airspace disease or  consolidation in the lungs. No blunting of costophrenic angles. No pneumothorax. Mediastinal contours appear intact.  Abdominal distention with diffuse underpenetration consistent with ascites. Scattered gas and stool in the colon. No small or large bowel distention. No free intra-abdominal air. No abnormal air-fluid levels noted.  IMPRESSION: Shallow inspiration with atelectasis in the lung bases. Nonobstructive bowel gas pattern. Abdominal distention with diffuse underpenetration suggesting ascites.   Electronically Signed   By: Lucienne Capers M.D.   On: 04/19/2014 23:22    Microbiology: Recent Results (from the past 240 hour(s))  CLOSTRIDIUM DIFFICILE BY PCR     Status: None   Collection Time    05/15/14 12:58 PM      Result Value Ref Range Status   C difficile by pcr NEGATIVE  NEGATIVE Final  BODY FLUID CULTURE     Status: None   Collection Time    05/16/14  9:07 AM      Result Value Ref Range Status   Specimen Description ASCITIC FLUID   Final   Special Requests NONE   Final   Gram Stain     Final   Value: RARE WBC PRESENT, PREDOMINANTLY MONONUCLEAR     NO ORGANISMS SEEN     Performed at [facility]   Culture     Final   Value: NO GROWTH 1 DAY     Performed at [facility]   Report Status PENDING   Incomplete  CLOSTRIDIUM DIFFICILE BY PCR     Status: None   Collection Time    05/17/14 10:24 AM      Result Value Ref Range Status   C difficile by pcr NEGATIVE  NEGATIVE Final     Labs: Basic Metabolic Panel:  Recent Labs Lab 05/14/14 1430 05/15/14 0610 05/16/14 0619 05/17/14 0829 05/18/14 0500  NA 142 142 140 138 138  K 3.9 3.9 4.0 4.1 3.6*  CL 101 104 101 100 102  CO2 23 22 24 23 21   GLUCOSE 108* 103* 94 87 95  BUN 19 17 18 21 20   CREATININE 1.03 1.01 1.38* 1.74* 1.64*  CALCIUM 9.0 8.9 9.2 8.8 8.8  MG  --  1.7  --   --   --    Liver Function Tests:  Recent Labs Lab 05/14/14 1430 05/15/14 0610 05/16/14 0619 05/17/14 0829 05/18/14 0500    AST 122* 101* 83* 76* 62*  ALT 20 17 15  14  14  ALKPHOS 173* 150* 142* 126* 111  BILITOT 2.9* 4.4* 5.7* 5.1* 3.7*  PROT 8.6* 7.7 7.5 7.3 7.2  ALBUMIN 2.6* 2.4* 2.5* 2.5* 2.4*   No results found for this basename: LIPASE, AMYLASE,  in the last 168 hours  Recent Labs Lab 05/14/14 1430 05/14/14 1746 05/14/14 1950 05/16/14 0617 05/17/14 0829  AMMONIA PATIENT IDENTIFICATION ERROR. PLEASE DISREGARD RESULTS. ACCOUNT WILL BE CREDITED. 36 35 47 30   CBC:  Recent Labs Lab 05/14/14 1430 05/15/14 0610 05/16/14 0802 05/17/14 0829 05/18/14 0500  WBC 11.4* 6.9 6.4 6.0 5.9  NEUTROABS 7.6  --   --   --   --   HGB 9.4* 7.9* 8.0* 8.1* 7.5*  HCT 28.0* 23.6* 24.5* 24.2* 22.7*  MCV 98.6 98.7 100.8* 100.4* 100.4*  PLT 245 176 155 133* 129*   Cardiac Enzymes: No results found for this basename: CKTOTAL, CKMB, CKMBINDEX, TROPONINI,  in the last 168 hours BNP: BNP (last 3 results) No results found for this basename: PROBNP,  in the last 8760 hours CBG:  Recent Labs Lab 05/14/14 2032 05/15/14 0728 05/15/14 1133 05/15/14 1704 05/15/14 2119  GLUCAP 96 110* 92 103* 99       Signed:  Vinson Tietze  [facility] 05/18/2014, 11:36 AM

## 2014-05-19 LAB — BODY FLUID CULTURE: Culture: NO GROWTH

## 2014-05-21 LAB — STOOL CULTURE

## 2014-05-21 NOTE — Progress Notes (Signed)
UR chart review completed.  

## 2014-05-21 NOTE — Telephone Encounter (Signed)
ON SCHEDULE 11/02 WITH [facility]

## 2014-06-11 ENCOUNTER — Encounter (HOSPITAL_COMMUNITY): Payer: Self-pay | Admitting: Emergency Medicine

## 2014-06-13 ENCOUNTER — Ambulatory Visit (INDEPENDENT_AMBULATORY_CARE_PROVIDER_SITE_OTHER): Payer: Medicare Other | Admitting: Gastroenterology

## 2014-06-13 ENCOUNTER — Encounter: Payer: Self-pay | Admitting: Gastroenterology

## 2014-06-13 VITALS — BP 113/71 | HR 114 | Temp 98.8°F | Ht 62.0 in | Wt 114.0 lb

## 2014-06-13 DIAGNOSIS — K7460 Unspecified cirrhosis of liver: Secondary | ICD-10-CM

## 2014-06-13 DIAGNOSIS — R188 Other ascites: Principal | ICD-10-CM

## 2014-06-13 LAB — CBC WITH DIFFERENTIAL/PLATELET
BASOS ABS: 0.1 10*3/uL (ref 0.0–0.1)
Basophils Relative: 1 % (ref 0–1)
Eosinophils Absolute: 0.1 10*3/uL (ref 0.0–0.7)
Eosinophils Relative: 1 % (ref 0–5)
HCT: 28.1 % — ABNORMAL LOW (ref 36.0–46.0)
HEMOGLOBIN: 9.9 g/dL — AB (ref 12.0–15.0)
LYMPHS PCT: 20 % (ref 12–46)
Lymphs Abs: 1.4 10*3/uL (ref 0.7–4.0)
MCH: 34.1 pg — ABNORMAL HIGH (ref 26.0–34.0)
MCHC: 35.2 g/dL (ref 30.0–36.0)
MCV: 96.9 fL (ref 78.0–100.0)
Monocytes Absolute: 0.5 10*3/uL (ref 0.1–1.0)
Monocytes Relative: 7 % (ref 3–12)
NEUTROS ABS: 5 10*3/uL (ref 1.7–7.7)
Neutrophils Relative %: 71 % (ref 43–77)
Platelets: 142 10*3/uL — ABNORMAL LOW (ref 150–400)
RBC: 2.9 MIL/uL — AB (ref 3.87–5.11)
RDW: 16.2 % — AB (ref 11.5–15.5)
WBC: 7 10*3/uL (ref 4.0–10.5)

## 2014-06-13 LAB — PROTIME-INR
INR: 1.31 (ref <1.50)
Prothrombin Time: 16.3 seconds — ABNORMAL HIGH (ref 11.6–15.2)

## 2014-06-13 NOTE — Patient Instructions (Signed)
USE BOOST, ENSURE, OR CARNATION INSTANT BREAKFAST FOUR TIMES A DAY.  REFERRAL TO [facility] FOR LIVER TRANSPLANT EVALUATION WILL BE MADE.  CHECK LABS TODAY.   PARACENTESIS THIS WEEK.  FOLLOW UP IN 2 MOS.

## 2014-06-13 NOTE — Progress Notes (Signed)
Past Medical History  Diagnosis Date  . GERD (gastroesophageal reflux disease)   . 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 c/w steatohepatitis  . Folate deficiency 09/2013  . Anasarca 10/10/2013  . Hematemesis/vomiting blood 02/24/2014  . Acute blood loss anemia 02/25/2014    Status post transfusion  . Macrocytosis 02/28/2014  . 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 Acute renal failure 09/2013    Pre-renal- resolved  . C. difficile colitis 04/19/2014

## 2014-06-13 NOTE — Progress Notes (Signed)
ON RECALL LIST  °

## 2014-06-13 NOTE — Assessment & Plan Note (Addendum)
DECOMPENSATED LIVER DISEASE ASSOCIATED WITH WEIGHT LOSS.  PT DECLINED PT APPT. USE BOOST, ENSURE, OR CARNATION INSTANT BREAKFAST FOUR TIMES A DAY. PT AGREES TO REFERRAL FOR LIVER TRANSPLANT EVALUATION CHECK LABS TODAY. ADJUST DIURETICS AFTER LABS KNOWN. PARACENTESIS THIS WEEK EGD IN ONE MO FOLLOW UP IN 2 MOS.

## 2014-06-13 NOTE — Progress Notes (Signed)
Subjective:    Patient ID: Wanda [name], female    DOB: June 13, 1976, 38 y.o.   MRN: [id number]  Default, Provider, MD  HPI Drinks something and white stuff comes back up. Comes out like throwing up. Feels nauseous and then throws up. Sometimes it looks bubbly-nor red, black, or brown. BOWELS SEEM OKAY. BMs: 2X/DAY(LOOSE TO SOFT). NO BRBPR OR MELENA. BLOOD IN STOOL OR BLACK STOOL. APPETITE: COMES AND GOES. LOST 20 LBS SINCE SEP 2015. VOMITING MAY HAPPEN: 1X/DAY BUT NOT EVERY DAY. WAS TRYING BOOST AND ENSURE. LAST LVP May 16, 2014. WATCHING HER SALT INTAKE. MAY HAVE INDIGESTION: AROUND [employer](DISCOMFORT, FREQUENCY DEPENDS ON WHAT SHE EATS/DRINKS).  PT DENIES FEVER, CHILLS, HEMATOCHEZIA, HEMATEMESIS, nausea, vomiting, melena, diarrhea, CHEST PAIN, SHORTNESS OF BREATH,   abdominal pain, problems swallowing, OR heartburn.  Past Medical History  Diagnosis Date  . GERD (gastroesophageal reflux disease)   . 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 c/w steatohepatitis  . Folate deficiency 09/2013  . Anasarca 10/10/2013  . Hematemesis/vomiting blood 02/24/2014  . Acute blood loss anemia 02/25/2014    Status post transfusion  . Macrocytosis 02/28/2014  . 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 Acute renal failure 09/2013    Pre-renal- resolved  . C. difficile colitis 04/19/2014   Past Surgical History  Procedure Laterality Date  . None    . Paracentesis  Feb 2015    1180 fluid, negative fluid analysis.   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 Paracentesis  10/2013  . Colonoscopy N/A 12/19/2013    SLF:NO OBVIOUS SOURCE FOR ANEMIA IDETIFIED/ONE COLON POLYP REMOVED/Small internal hemorrhoids  . Esophagogastroduodenoscopy N/A 02/11/2014    XNT:[id number] varices with bleeding stigmata-status post esophageal  band ligation therapy. Portal gastropathy    Allergies  Allergen Reactions  . Lasix [Furosemide]     "doesn't work"    Current Outpatient Prescriptions  Medication Sig Dispense Refill  . feeding supplement, RESOURCE BREEZE, (RESOURCE BREEZE) LIQD Take 1 Container by mouth 3 (three) times daily between meals.    [name] loperamide (IMODIUM) 2 MG capsule Take 1 capsule (2 mg total) by mouth 2 (two) times daily as needed for diarrhea or loose stools.    [name] loratadine (CLARITIN) 10 MG tablet Take 1 tablet (10 mg total) by mouth daily.    . magnesium oxide (MAG-OX) 400 (241.3 MG) MG tablet Take 1 tablet (400 mg total) by mouth daily.    .      . midodrine (PROAMATINE) 10 MG tablet Take 1 tablet (10 mg total) by mouth 2 (two) times daily with a meal. Medication to keep your blood pressure from falling.    [name] oxyCODONE 10 MG TABS Take 1 tablet (10 mg total) by mouth every 6 (six) hours as needed for moderate pain.    . pantoprazole (PROTONIX) 40 MG tablet  1 tablet 2 times daily     . KCL   OFF AND ON   . spironolactone (ALDACTONE) 50 MG tablet Start this 50 mg tablet 1 time daily after you finish your 25 mg tablets. 1 PO QD   . torsemide (DEMADEX) 10 MG tablet Take 3 tablets (30 mg total) by mouth daily. 1 PO BID       Review of Systems     Objective:   Physical Exam  Constitutional: She is  oriented to person, place, and time. She appears well-developed and well-nourished. No distress.  HENT:  Head: Normocephalic and atraumatic.  Mouth/Throat: Oropharynx is clear and moist. No oropharyngeal exudate.  Eyes: Pupils are equal, round, and reactive to light. No scleral icterus.  Neck: Normal range of motion. Neck supple.  Cardiovascular: Normal rate, regular rhythm and normal heart sounds.   Pulmonary/Chest: Effort normal and breath sounds normal. No respiratory distress.  Abdominal: Soft. Bowel sounds are normal. She exhibits distension. There is tenderness. There is no rebound and no guarding.    r abdominal bruit  Lymphadenopathy:    She has no cervical adenopathy.  Neurological: She is alert and oriented to person, place, and time.  NO FOCAL DEFICITS, good eye contact    Psychiatric:  FLAT AFFECT, NL MOOD   Vitals reviewed.         Assessment & Plan:

## 2014-06-14 LAB — COMPREHENSIVE METABOLIC PANEL
ALBUMIN: 3.1 g/dL — AB (ref 3.5–5.2)
ALK PHOS: 109 U/L (ref 39–117)
ALT: <8 U/L (ref 0–35)
AST: 58 U/L — ABNORMAL HIGH (ref 0–37)
BUN: 10 mg/dL (ref 6–23)
CO2: 30 mEq/L (ref 19–32)
Calcium: 8.9 mg/dL (ref 8.4–10.5)
Chloride: 90 mEq/L — ABNORMAL LOW (ref 96–112)
Creat: 1.46 mg/dL — ABNORMAL HIGH (ref 0.50–1.10)
GLUCOSE: 86 mg/dL (ref 70–99)
POTASSIUM: 3.2 meq/L — AB (ref 3.5–5.3)
Sodium: 136 mEq/L (ref 135–145)
TOTAL PROTEIN: 8.2 g/dL (ref 6.0–8.3)
Total Bilirubin: 3.2 mg/dL — ABNORMAL HIGH (ref 0.2–1.2)

## 2014-06-14 LAB — AFP TUMOR MARKER: AFP TUMOR MARKER: 4.2 ng/mL (ref <6.1)

## 2014-06-15 ENCOUNTER — Ambulatory Visit (HOSPITAL_COMMUNITY)
Admission: RE | Admit: 2014-06-15 | Discharge: 2014-06-15 | Disposition: A | Discharge status: Home / Self Care | Payer: Medicare Other | Source: Ambulatory Visit | Attending: Gastroenterology | Admitting: Gastroenterology

## 2014-06-15 DIAGNOSIS — K7460 Unspecified cirrhosis of liver: Secondary | ICD-10-CM | POA: Insufficient documentation

## 2014-06-15 DIAGNOSIS — R188 Other ascites: Secondary | ICD-10-CM | POA: Diagnosis not present

## 2014-06-15 IMAGING — US US ABDOMEN LIMITED
1 series · 7 of 7 positions shown · non-contrast
Comparison: None.

US RENAL dated 11/11/2013; US PARACENTESIS dated 11/06/2013

CLINICAL DATA: Ascites.  Cirrhosis.

EXAM:
LIMITED ABDOMEN ULTRASOUND FOR ASCITES
TECHNIQUE: Limited ultrasound survey for ascites was performed in all four
abdominal quadrants.

[Series 1: us abdomen limited · 0.21mm/px · 7 of 7 slices shown]
[im 1/7]
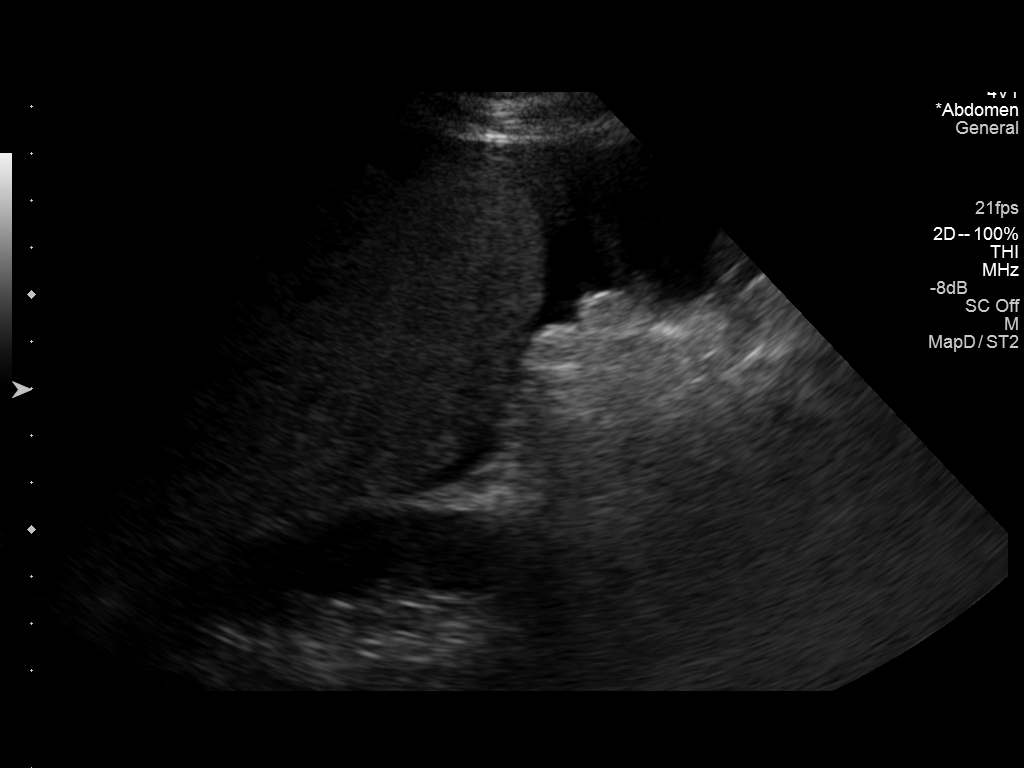
[im 2/7]
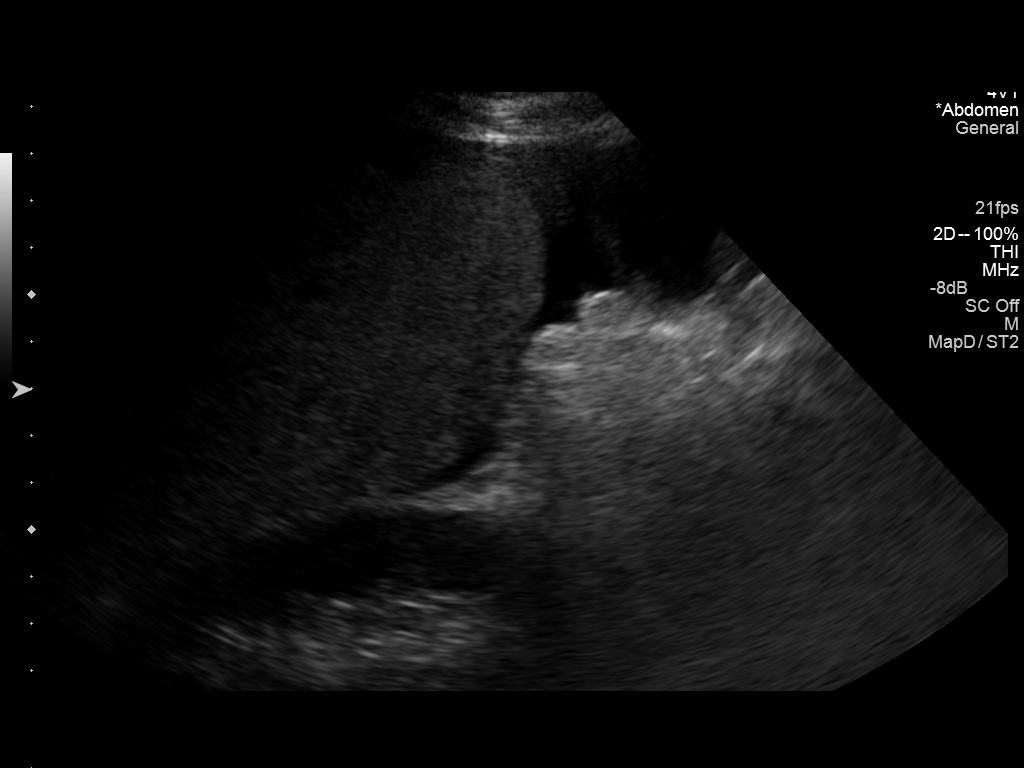
[im 3/7]
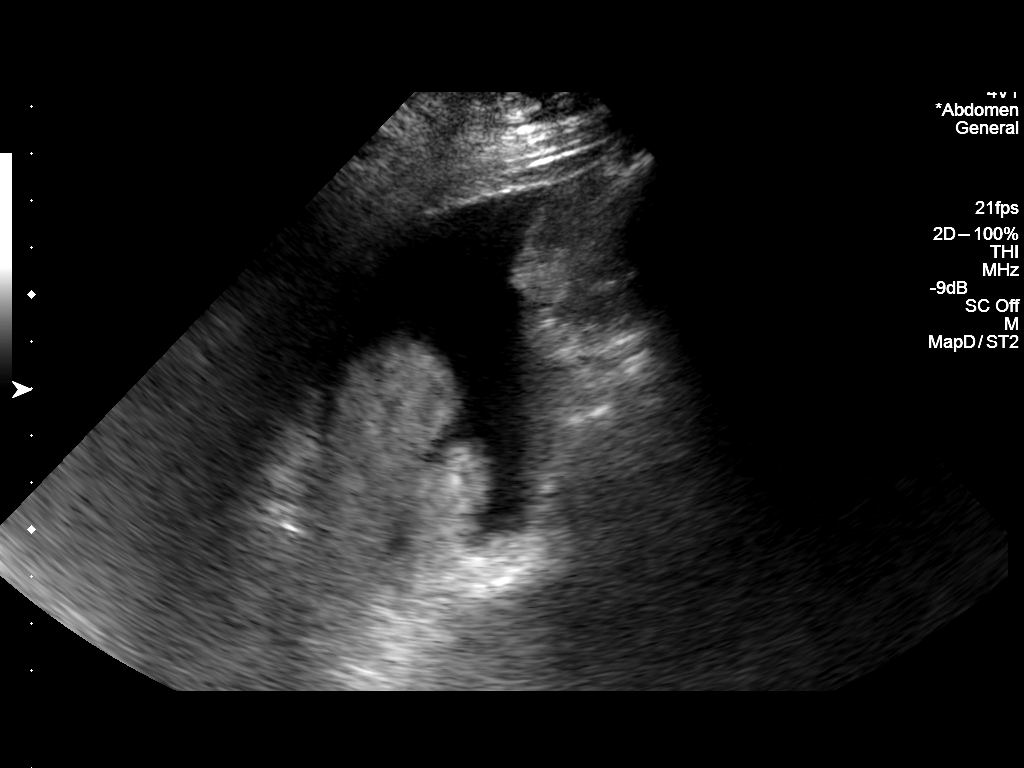
[im 4/7]
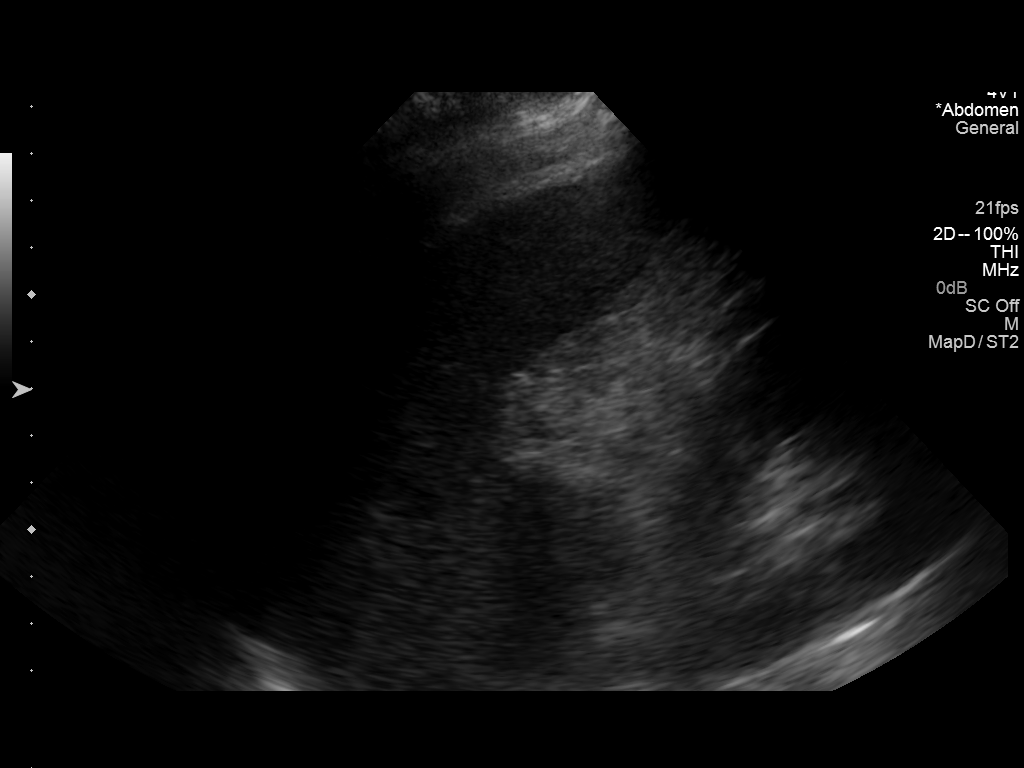
[im 5/7]
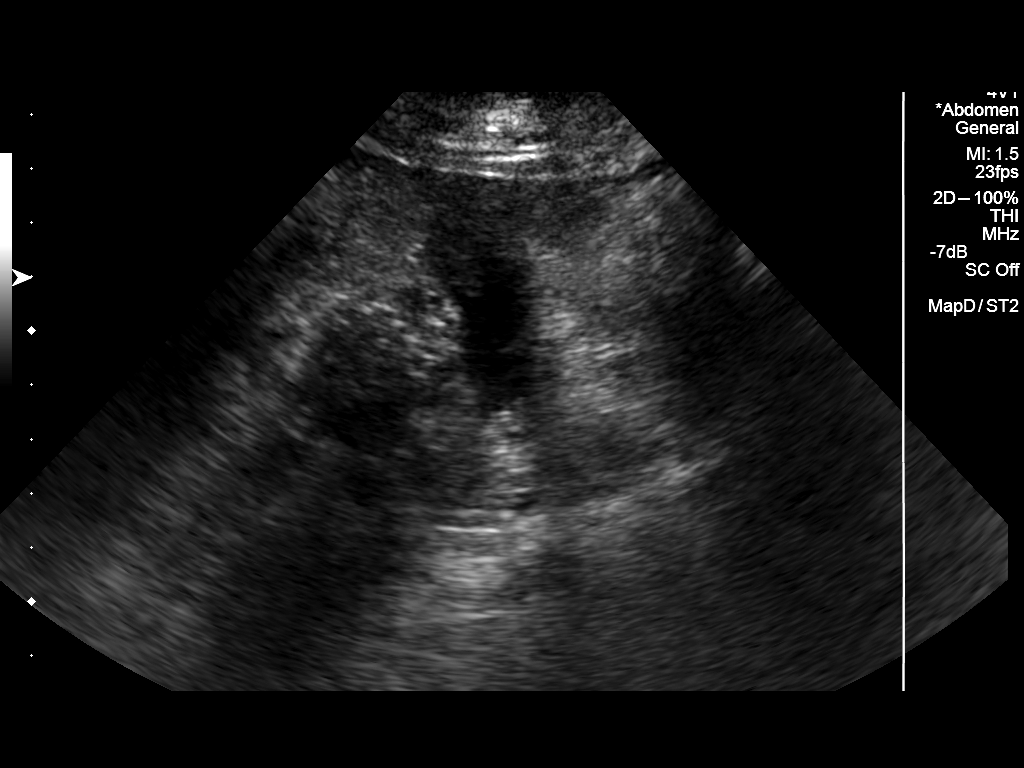
[im 6/7]
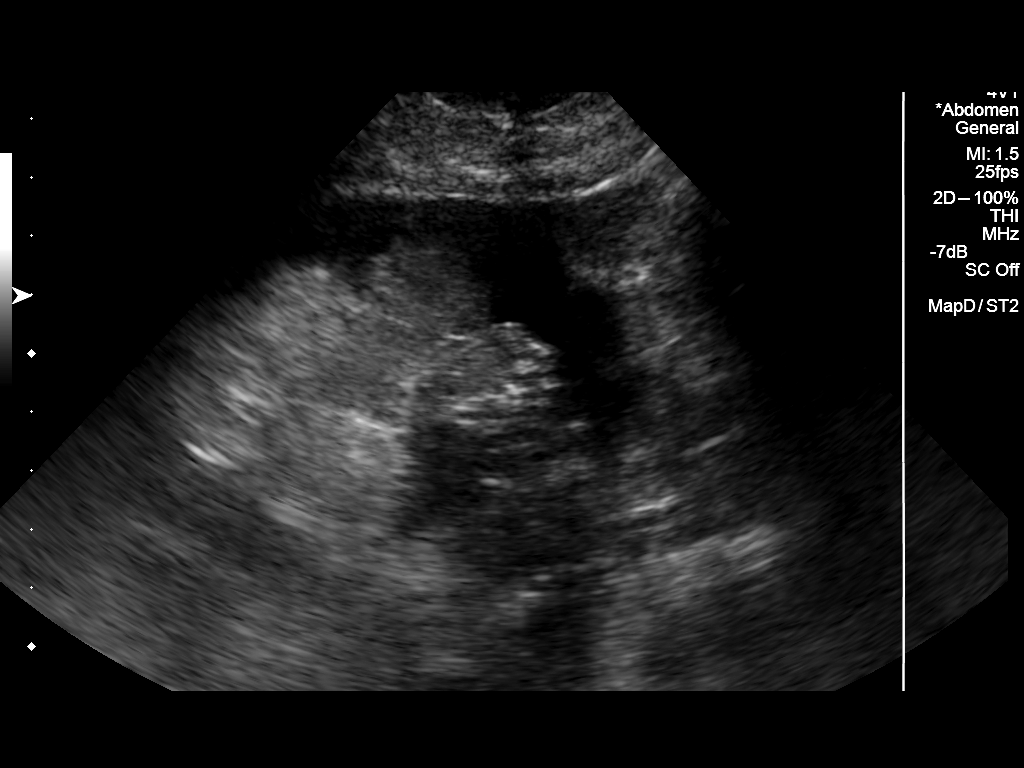
[im 7/7]
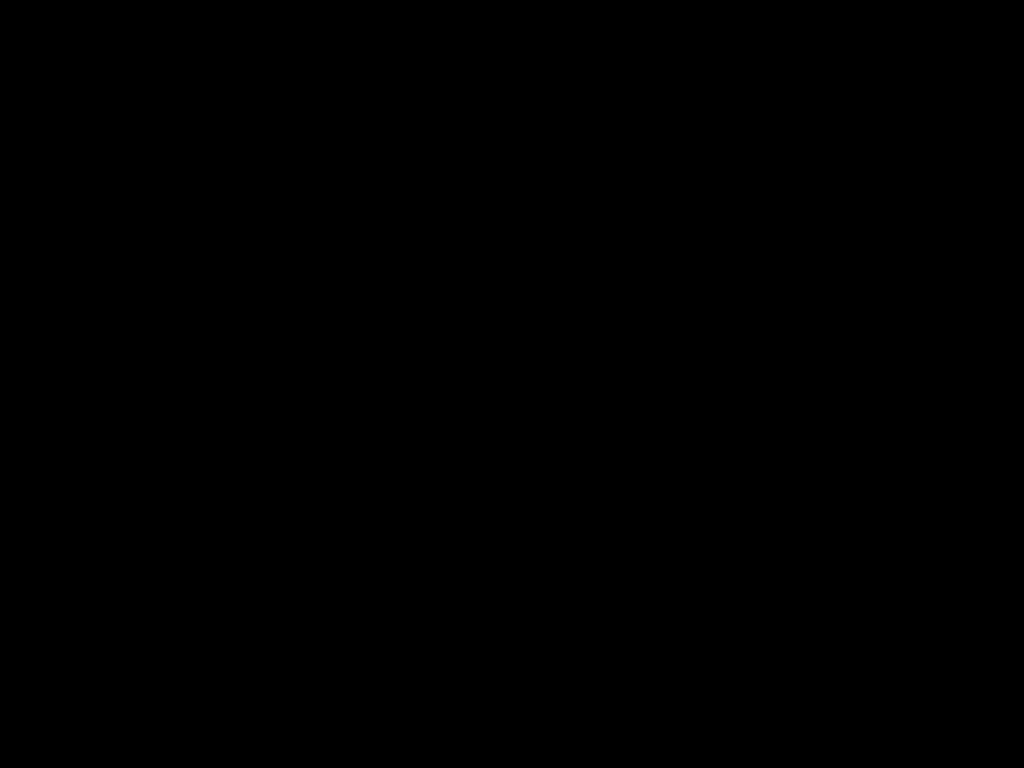

[7 of 7 positions shown; findings below may reference images not displayed]

FINDINGS: Small amount of ascites present.
IMPRESSION: Small amount ascites.

## 2014-06-15 IMAGING — CR DG CHEST 1V PORT
1 series · 1 of 1 positions shown · non-contrast
Comparison: 10/12/2013.

CLINICAL DATA: PICC line placement

EXAM:
PORTABLE CHEST - 1 VIEW

[portable]
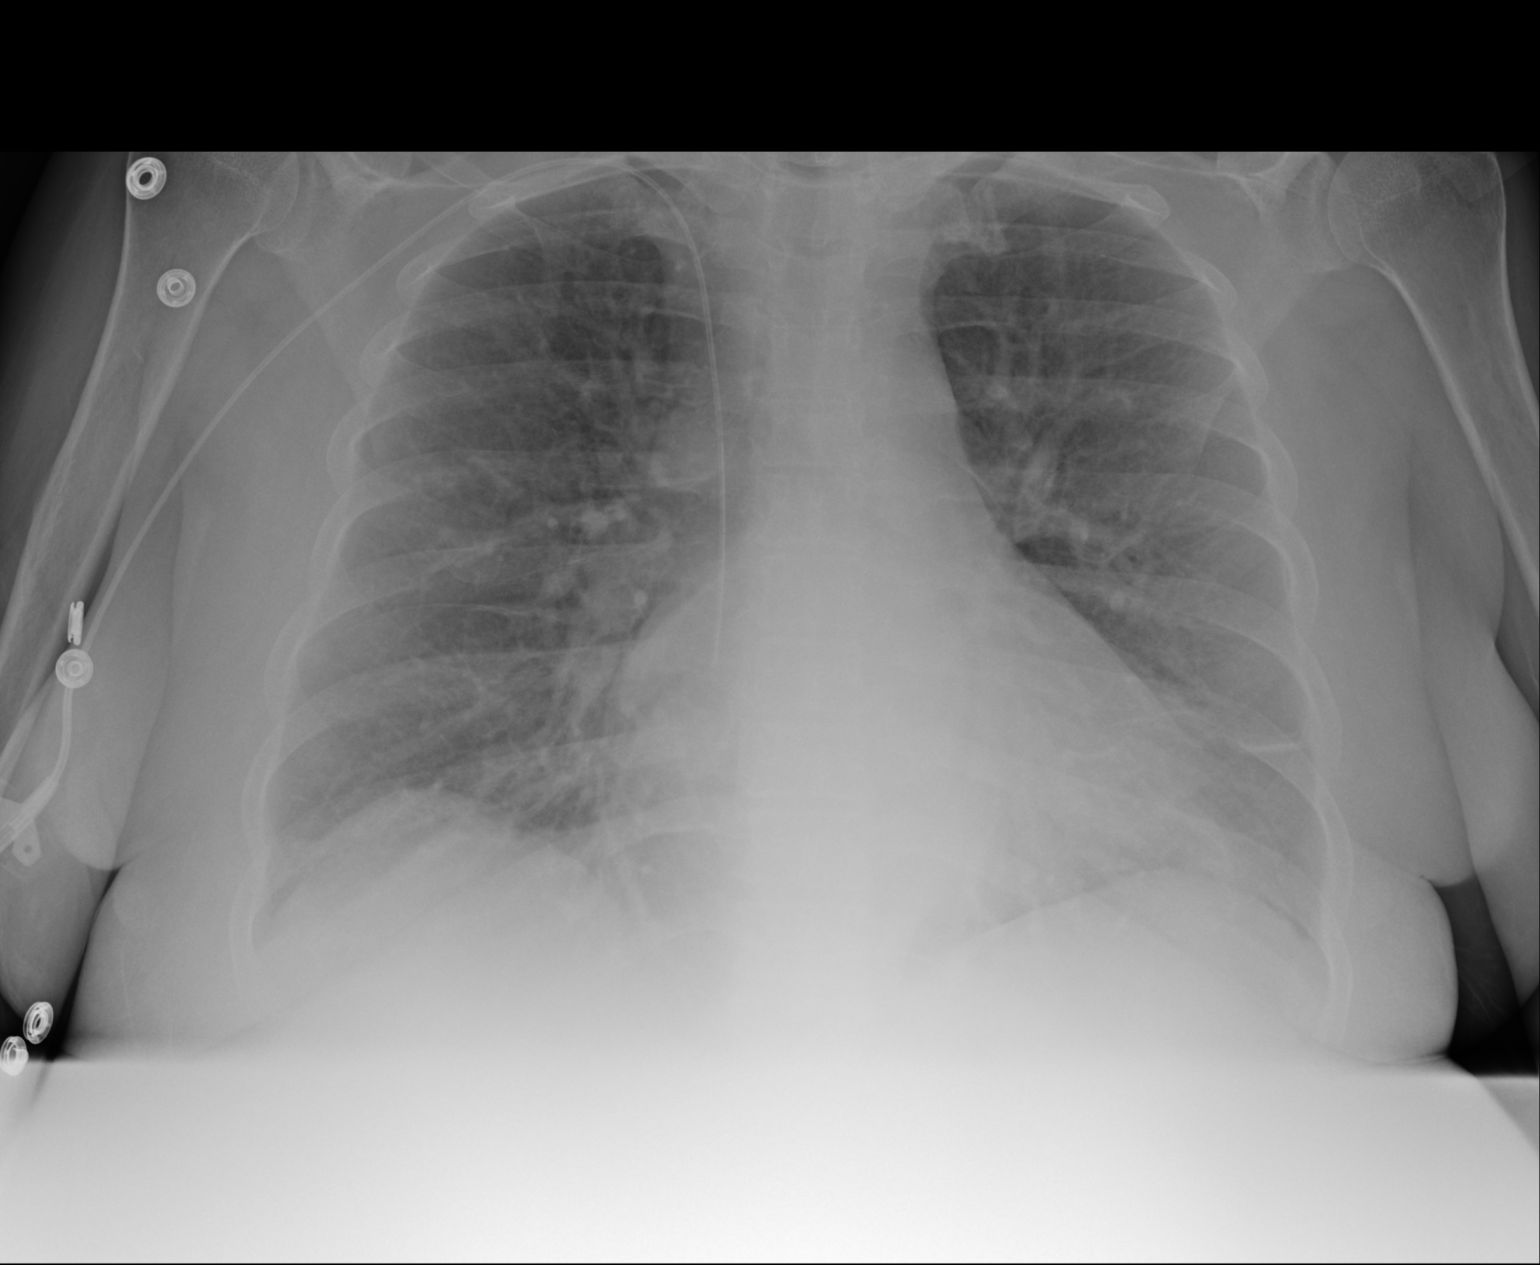

[1 of 1 positions shown; findings below may reference images not displayed]

FINDINGS: Right PICC line is in place. Wire appears to be in place. Tip of the
PICC line is at the level of the distal superior vena cava. No gross
pneumothorax.

Cardiomegaly.

Pulmonary vascular prominence most notable centrally.

Subsegmental atelectasis lung bases.
IMPRESSION: PICC line tip distal superior vena cava level.

Cardiomegaly.

Pulmonary vascular prominence most notable centrally.

This is a call report.

## 2014-06-19 ENCOUNTER — Other Ambulatory Visit: Payer: Self-pay

## 2014-06-19 DIAGNOSIS — Z01818 Encounter for other preprocedural examination: Secondary | ICD-10-CM

## 2014-06-20 ENCOUNTER — Other Ambulatory Visit: Payer: Self-pay

## 2014-06-20 DIAGNOSIS — K228 Other specified diseases of esophagus: Secondary | ICD-10-CM

## 2014-06-20 DIAGNOSIS — K2289 Other specified disease of esophagus: Secondary | ICD-10-CM

## 2014-06-20 NOTE — Progress Notes (Signed)
No pcp per patient 

## 2014-06-25 IMAGING — US US BIOPSY
1 series · 12 of 12 positions shown · non-contrast
Comparison: none

CLINICAL DATA: Cirrhosis

EXAM:
ULTRASOUND-GUIDED CORE LIVER BIOPSY
TECHNIQUE: An ultrasound guided liver biopsy was thoroughly discussed with the
patient and questions were answered. The benefits, risks,
alternatives, and complications were also discussed. The patient
understands and wishes to proceed with the procedure. A verbal as
well as written consent was obtained.

[Series 1: us biopsy · 0.24mm/px · 12 of 12 slices shown]
[im 1/12]
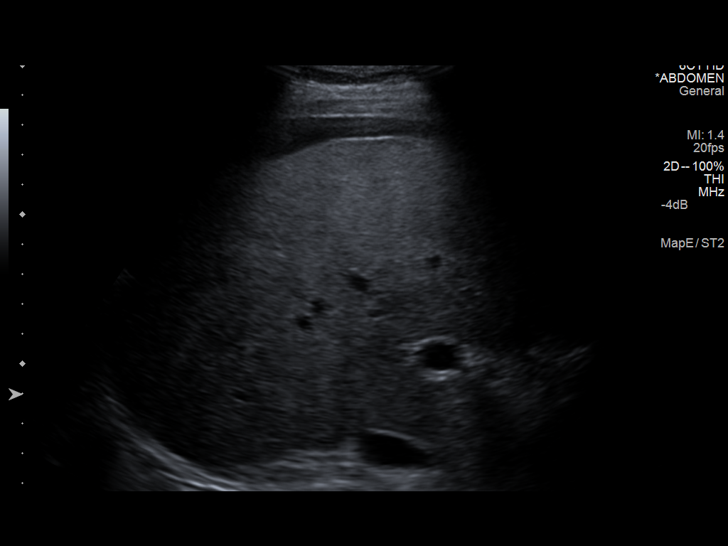
[im 2/12]
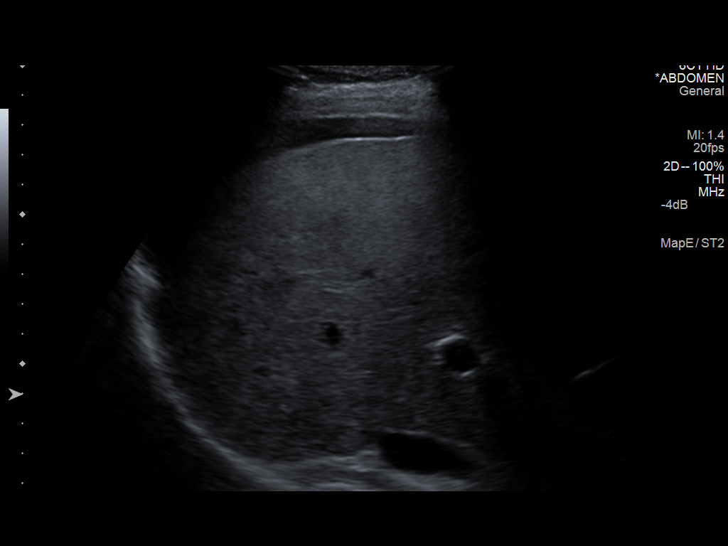
[im 3/12]
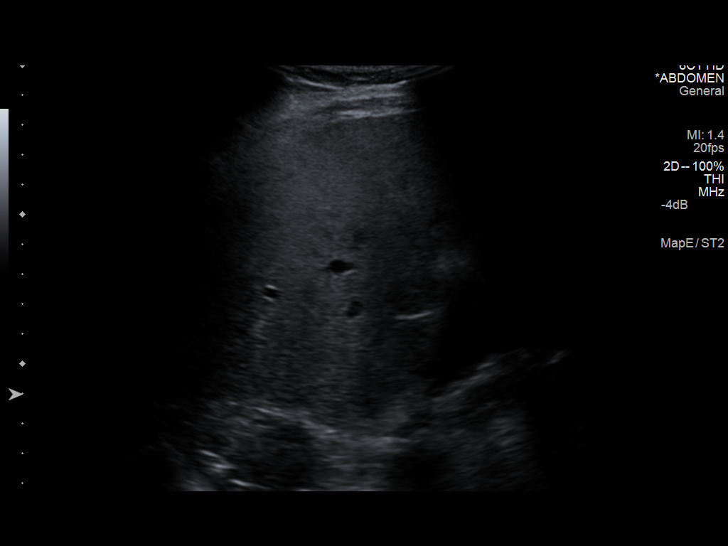
[im 4/12]
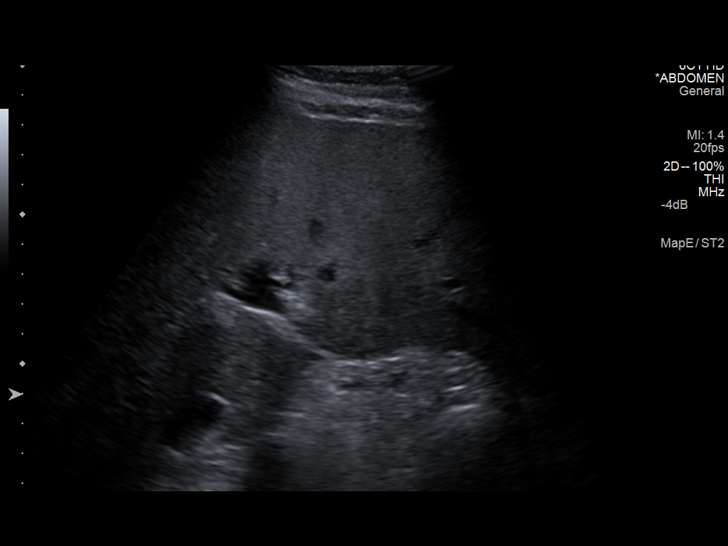
[im 5/12]
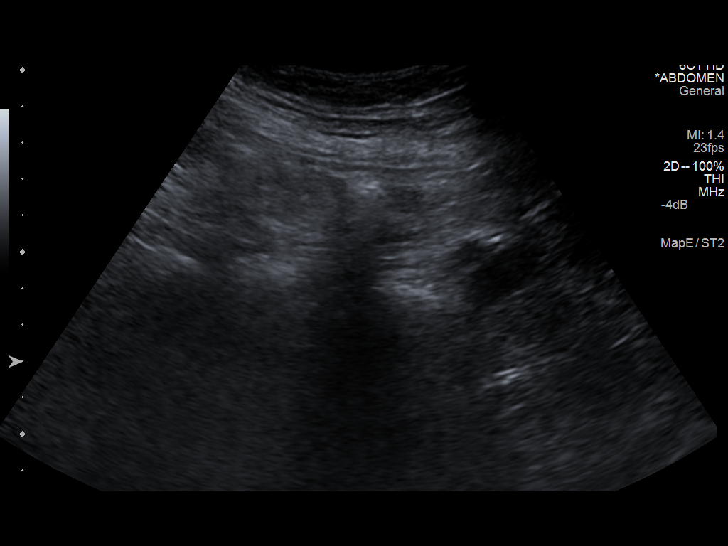
[im 6/12]
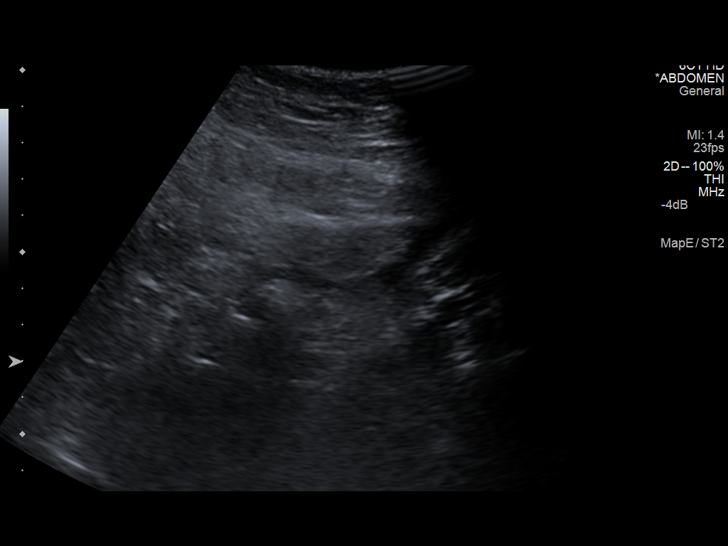
[im 7/12]
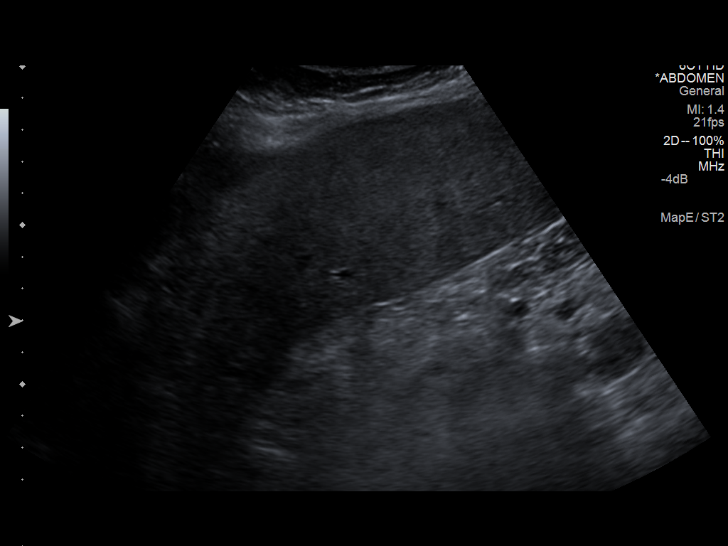
[im 8/12]
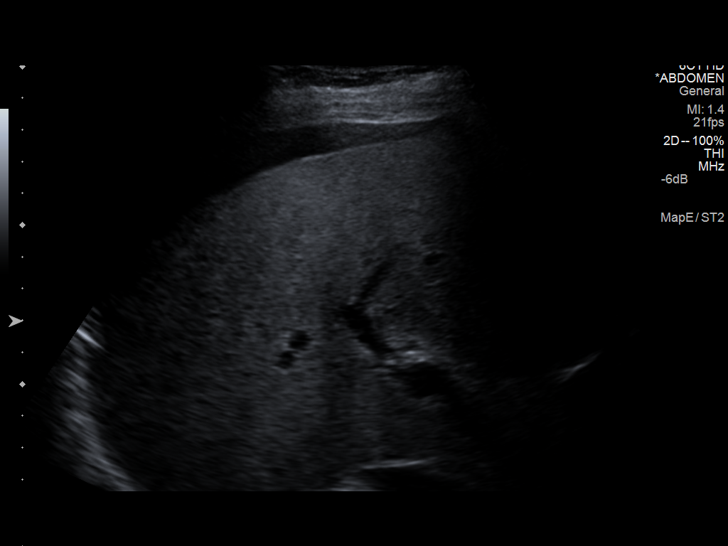
[im 9/12]
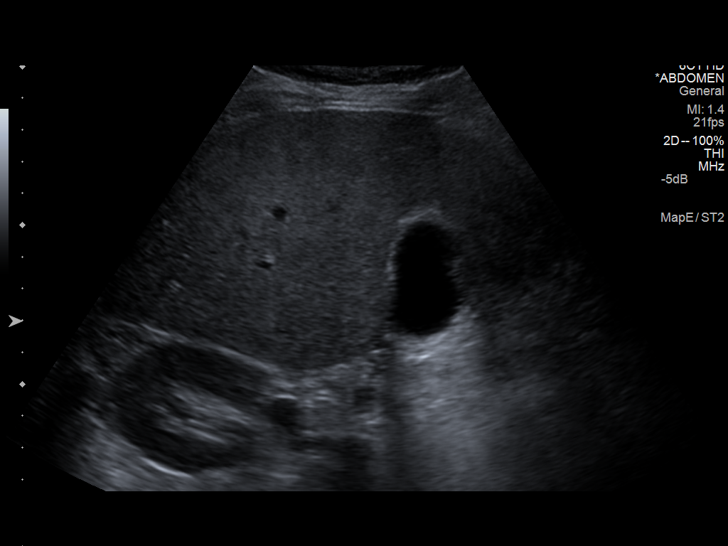
[im 10/12]
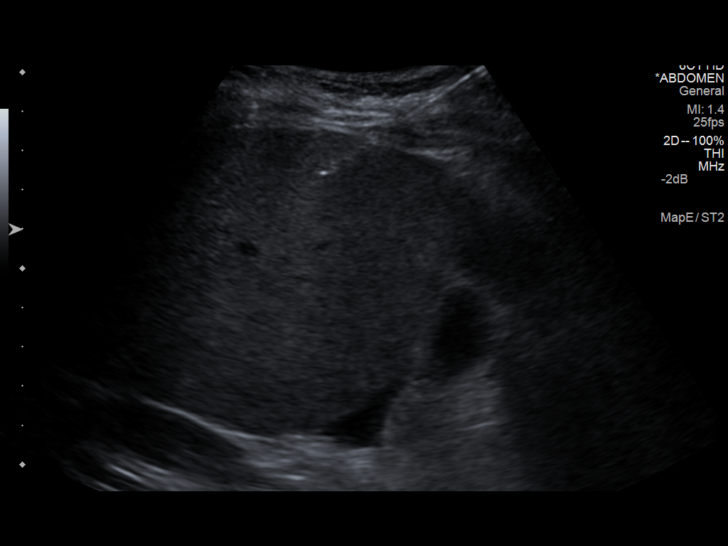
[im 11/12]
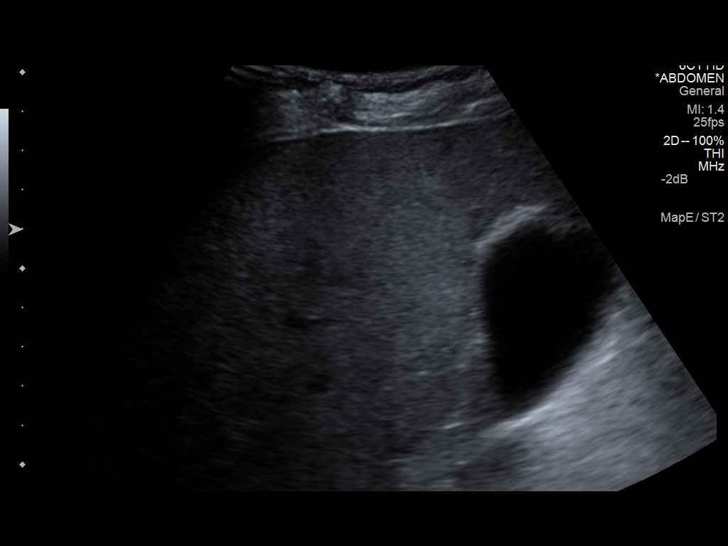
[im 12/12]
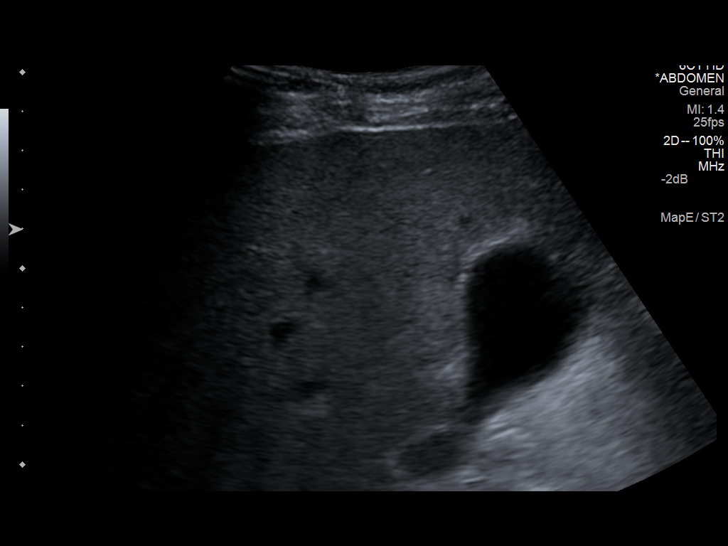

[12 of 12 positions shown; findings below may reference images not displayed]

Survey ultrasound of the liver was performed. No significant
abdominal ascites identified. An appropriate skin entry site was
determined. Skin site was marked, prepped with Betadine, and draped
in usual sterile fashion, and infiltrated locally with 1% lidocaine.

Intravenous Fentanyl and Versed were administered as conscious
sedation during continuous cardiorespiratory monitoring by the
radiology RN, with a total moderate sedation time of six minutes.

A 17 gauge trocar needle was advanced under ultrasound guidance into
the liver. 4 coaxial 39gauge core samples were then obtained through
the guide needle. The guide needle was removed. Post procedure scans
demonstrate no apparent complication.
FINDINGS: No significant abdominal ascites. No focal liver lesion identified.
Ultrasound-guided random core liver biopsy obtained.
IMPRESSION: 1. Technically successful ultrasound guided core liver biopsy.

## 2014-06-25 IMAGING — CR DG CHEST 1V
1 series · 1 of 1 positions shown · non-contrast
Comparison: 11/13/2013

CLINICAL DATA: Recent pneumonia

EXAM:
CHEST - 1 VIEW

[view not recorded]
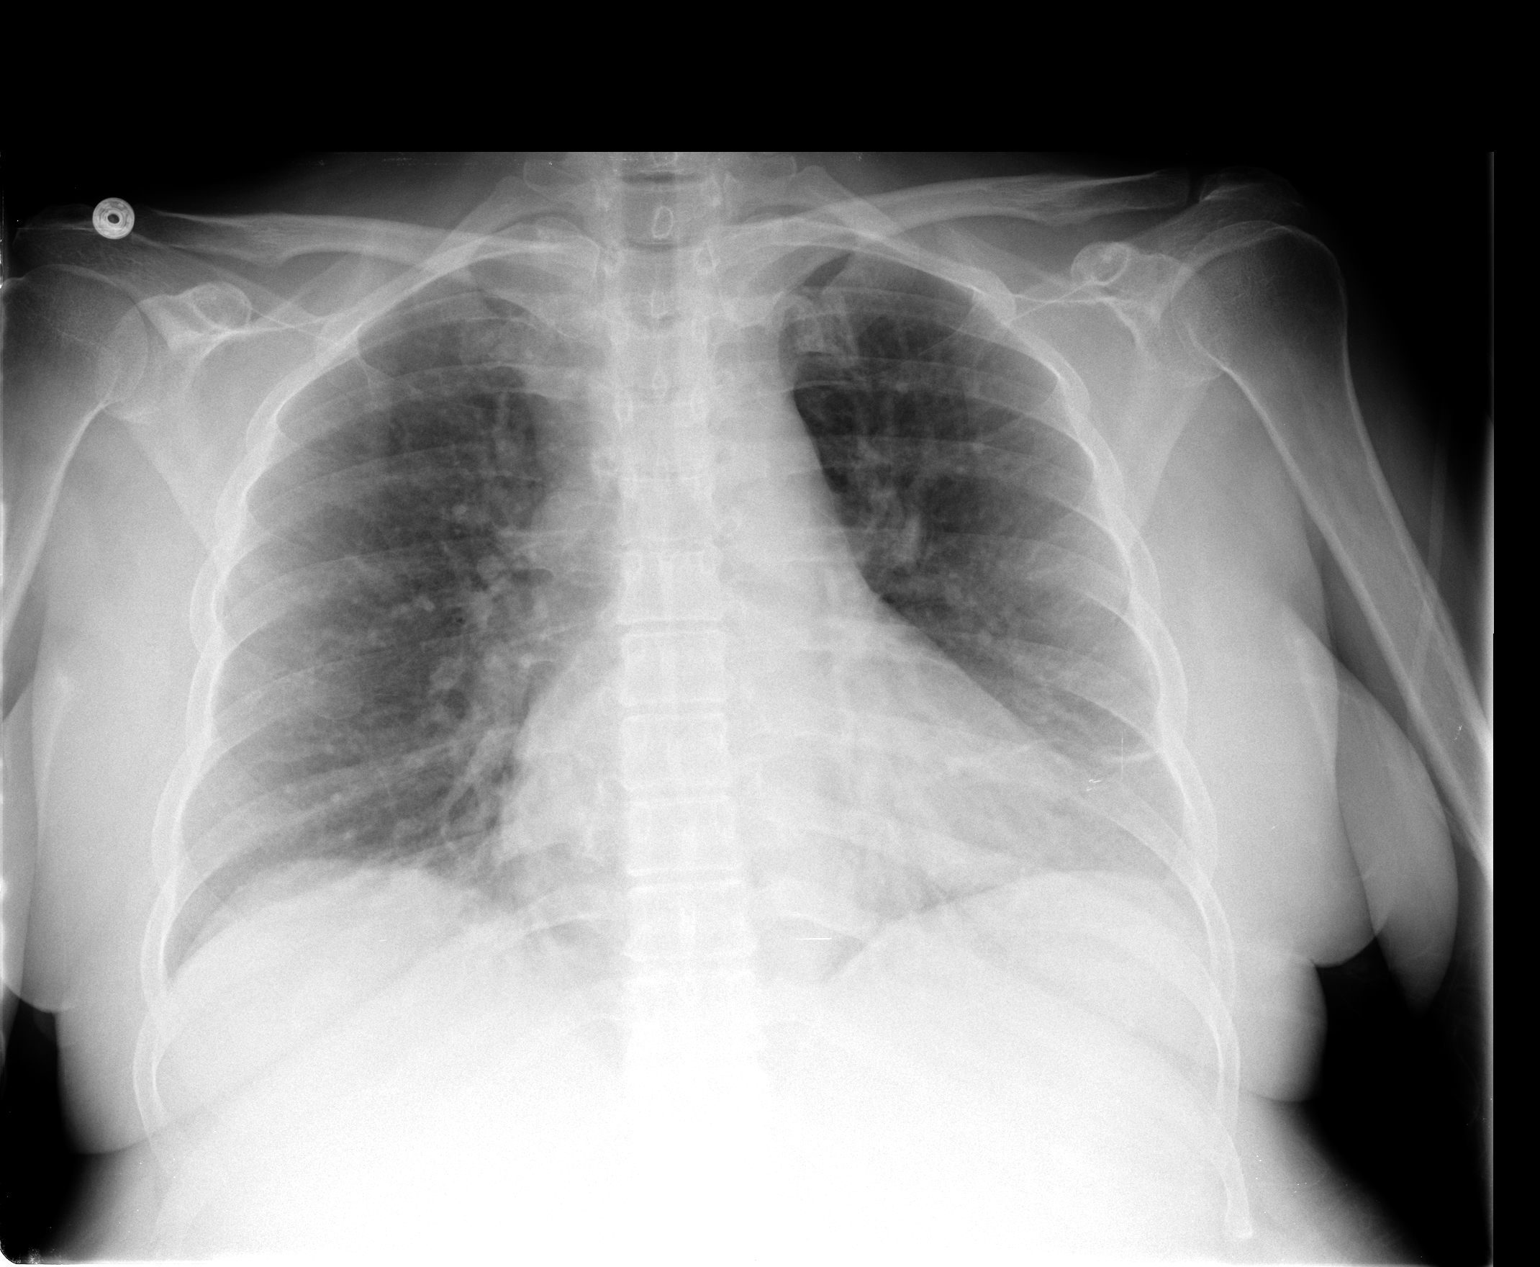

[1 of 1 positions shown; findings below may reference images not displayed]

FINDINGS: Chronic lingula peripheral subpleural scarring. Borderline heart
size with vascular prominence. No definite CHF or pneumonia. No
effusion or pneumothorax. Trachea midline.
IMPRESSION: Cardiomegaly with vascular congestion

Chronic lingula scarring

## 2014-07-03 ENCOUNTER — Inpatient Hospital Stay (HOSPITAL_COMMUNITY)
Admission: EM | Admit: 2014-07-03 | Discharge: 2014-07-07 | DRG: 368 | Disposition: A | Discharge status: Home / Self Care | Payer: Medicare Other | Attending: Internal Medicine | Admitting: Internal Medicine

## 2014-07-03 ENCOUNTER — Telehealth: Payer: Self-pay

## 2014-07-03 ENCOUNTER — Encounter (HOSPITAL_COMMUNITY): Payer: Self-pay | Admitting: *Deleted

## 2014-07-03 ENCOUNTER — Other Ambulatory Visit: Payer: Self-pay

## 2014-07-03 ENCOUNTER — Other Ambulatory Visit (HOSPITAL_COMMUNITY): Payer: Self-pay

## 2014-07-03 DIAGNOSIS — K920 Hematemesis: Secondary | ICD-10-CM

## 2014-07-03 DIAGNOSIS — Z888 Allergy status to other drugs, medicaments and biological substances status: Secondary | ICD-10-CM | POA: Diagnosis not present

## 2014-07-03 DIAGNOSIS — K219 Gastro-esophageal reflux disease without esophagitis: Secondary | ICD-10-CM | POA: Diagnosis present

## 2014-07-03 DIAGNOSIS — D649 Anemia, unspecified: Secondary | ICD-10-CM | POA: Diagnosis present

## 2014-07-03 DIAGNOSIS — R609 Edema, unspecified: Secondary | ICD-10-CM | POA: Diagnosis present

## 2014-07-03 DIAGNOSIS — Z6823 Body mass index (BMI) 23.0-23.9, adult: Secondary | ICD-10-CM

## 2014-07-03 DIAGNOSIS — I8501 Esophageal varices with bleeding: Principal | ICD-10-CM | POA: Diagnosis present

## 2014-07-03 DIAGNOSIS — K3189 Other diseases of stomach and duodenum: Secondary | ICD-10-CM | POA: Diagnosis present

## 2014-07-03 DIAGNOSIS — R188 Other ascites: Secondary | ICD-10-CM | POA: Diagnosis present

## 2014-07-03 DIAGNOSIS — E876 Hypokalemia: Secondary | ICD-10-CM | POA: Diagnosis present

## 2014-07-03 DIAGNOSIS — D62 Acute posthemorrhagic anemia: Secondary | ICD-10-CM | POA: Diagnosis present

## 2014-07-03 DIAGNOSIS — J9690 Respiratory failure, unspecified, unspecified whether with hypoxia or hypercapnia: Secondary | ICD-10-CM

## 2014-07-03 DIAGNOSIS — K7581 Nonalcoholic steatohepatitis (NASH): Secondary | ICD-10-CM | POA: Diagnosis present

## 2014-07-03 DIAGNOSIS — R110 Nausea: Secondary | ICD-10-CM

## 2014-07-03 DIAGNOSIS — I471 Supraventricular tachycardia: Secondary | ICD-10-CM

## 2014-07-03 DIAGNOSIS — R000 Tachycardia, unspecified: Secondary | ICD-10-CM | POA: Diagnosis present

## 2014-07-03 DIAGNOSIS — R195 Other fecal abnormalities: Secondary | ICD-10-CM

## 2014-07-03 DIAGNOSIS — I8500 Esophageal varices without bleeding: Secondary | ICD-10-CM | POA: Insufficient documentation

## 2014-07-03 DIAGNOSIS — K7460 Unspecified cirrhosis of liver: Secondary | ICD-10-CM | POA: Diagnosis present

## 2014-07-03 DIAGNOSIS — Z87891 Personal history of nicotine dependence: Secondary | ICD-10-CM | POA: Diagnosis not present

## 2014-07-03 DIAGNOSIS — Z978 Presence of other specified devices: Secondary | ICD-10-CM

## 2014-07-03 DIAGNOSIS — E43 Unspecified severe protein-calorie malnutrition: Secondary | ICD-10-CM | POA: Diagnosis present

## 2014-07-03 DIAGNOSIS — K766 Portal hypertension: Secondary | ICD-10-CM | POA: Diagnosis present

## 2014-07-03 DIAGNOSIS — E860 Dehydration: Secondary | ICD-10-CM | POA: Diagnosis present

## 2014-07-03 DIAGNOSIS — I959 Hypotension, unspecified: Secondary | ICD-10-CM | POA: Diagnosis present

## 2014-07-03 DIAGNOSIS — E46 Unspecified protein-calorie malnutrition: Secondary | ICD-10-CM

## 2014-07-03 LAB — APTT: aPTT: 36 seconds (ref 24–37)

## 2014-07-03 LAB — COMPREHENSIVE METABOLIC PANEL
ALT: 8 U/L (ref 0–35)
AST: 73 U/L — ABNORMAL HIGH (ref 0–37)
Albumin: 2.6 g/dL — ABNORMAL LOW (ref 3.5–5.2)
Alkaline Phosphatase: 90 U/L (ref 39–117)
Anion gap: 20 — ABNORMAL HIGH (ref 5–15)
BUN: 32 mg/dL — ABNORMAL HIGH (ref 6–23)
CALCIUM: 8.7 mg/dL (ref 8.4–10.5)
CO2: 24 mEq/L (ref 19–32)
Chloride: 95 mEq/L — ABNORMAL LOW (ref 96–112)
Creatinine, Ser: 1.22 mg/dL — ABNORMAL HIGH (ref 0.50–1.10)
GFR calc non Af Amer: 55 mL/min — ABNORMAL LOW (ref >90)
GFR, EST AFRICAN AMERICAN: 63 mL/min — AB (ref >90)
GLUCOSE: 80 mg/dL (ref 70–99)
Potassium: 2.7 mEq/L — CL (ref 3.7–5.3)
Sodium: 139 mEq/L (ref 137–147)
Total Bilirubin: 3 mg/dL — ABNORMAL HIGH (ref 0.3–1.2)
Total Protein: 7.7 g/dL (ref 6.0–8.3)

## 2014-07-03 LAB — BASIC METABOLIC PANEL
Anion gap: 18 — ABNORMAL HIGH (ref 5–15)
BUN: 32 mg/dL — AB (ref 6–23)
CALCIUM: 8.1 mg/dL — AB (ref 8.4–10.5)
CHLORIDE: 98 meq/L (ref 96–112)
CO2: 22 mEq/L (ref 19–32)
CREATININE: 1.19 mg/dL — AB (ref 0.50–1.10)
GFR calc non Af Amer: 56 mL/min — ABNORMAL LOW (ref >90)
GFR, EST AFRICAN AMERICAN: 65 mL/min — AB (ref >90)
Glucose, Bld: 90 mg/dL (ref 70–99)
Potassium: 3.9 mEq/L (ref 3.7–5.3)
Sodium: 138 mEq/L (ref 137–147)

## 2014-07-03 LAB — CBC WITH DIFFERENTIAL/PLATELET
BASOS PCT: 1 % (ref 0–1)
Basophils Absolute: 0.1 10*3/uL (ref 0.0–0.1)
EOS ABS: 0.1 10*3/uL (ref 0.0–0.7)
Eosinophils Relative: 1 % (ref 0–5)
HCT: 19.8 % — ABNORMAL LOW (ref 36.0–46.0)
HEMOGLOBIN: 6.7 g/dL — AB (ref 12.0–15.0)
Lymphocytes Relative: 20 % (ref 12–46)
Lymphs Abs: 1.6 10*3/uL (ref 0.7–4.0)
MCH: 36 pg — AB (ref 26.0–34.0)
MCHC: 33.8 g/dL (ref 30.0–36.0)
MCV: 106.5 fL — ABNORMAL HIGH (ref 78.0–100.0)
MONOS PCT: 6 % (ref 3–12)
Monocytes Absolute: 0.5 10*3/uL (ref 0.1–1.0)
NEUTROS PCT: 73 % (ref 43–77)
Neutro Abs: 5.8 10*3/uL (ref 1.7–7.7)
Platelets: 102 10*3/uL — ABNORMAL LOW (ref 150–400)
RBC: 1.86 MIL/uL — ABNORMAL LOW (ref 3.87–5.11)
RDW: 17.3 % — ABNORMAL HIGH (ref 11.5–15.5)
WBC: 8 10*3/uL (ref 4.0–10.5)

## 2014-07-03 LAB — TROPONIN I: Troponin I: <0.3 ng/mL (ref <0.30)

## 2014-07-03 LAB — AMMONIA: Ammonia: 34 umol/L (ref 11–60)

## 2014-07-03 LAB — MRSA PCR SCREENING: MRSA by PCR: NEGATIVE

## 2014-07-03 LAB — PROTIME-INR
INR: 1.34 (ref 0.00–1.49)
PROTHROMBIN TIME: 16.7 s — AB (ref 11.6–15.2)

## 2014-07-03 LAB — PREPARE RBC (CROSSMATCH)

## 2014-07-03 LAB — HEMOGLOBIN AND HEMATOCRIT, BLOOD
HCT: 18.3 % — ABNORMAL LOW (ref 36.0–46.0)
Hemoglobin: 6.1 g/dL — CL (ref 12.0–15.0)

## 2014-07-03 LAB — POC OCCULT BLOOD, ED: Fecal Occult Bld: POSITIVE — AB

## 2014-07-03 LAB — MAGNESIUM: Magnesium: 1.1 mg/dL — ABNORMAL LOW (ref 1.5–2.5)

## 2014-07-03 LAB — CG4 I-STAT (LACTIC ACID): Lactic Acid, Venous: 2.95 mmol/L — ABNORMAL HIGH (ref 0.5–2.2)

## 2014-07-03 LAB — LIPASE, BLOOD: Lipase: 52 U/L (ref 11–59)

## 2014-07-03 MED ORDER — SODIUM CHLORIDE 0.9 % IV SOLN
1000.0000 mL | Freq: Once | INTRAVENOUS | Status: AC
  Administered 2014-07-03: 1000 mL via INTRAVENOUS

## 2014-07-03 MED ORDER — SODIUM CHLORIDE 0.9 % IV SOLN
25.0000 ug/h | INTRAVENOUS | Status: DC
  Administered 2014-07-03 – 2014-07-07 (×9): 50 ug/h via INTRAVENOUS
  Filled 2014-07-03 (×17): qty 1

## 2014-07-03 MED ORDER — SENNA 8.6 MG PO TABS
1.0000 | ORAL_TABLET | Freq: Two times a day (BID) | ORAL | Status: DC
  Administered 2014-07-04 – 2014-07-07 (×6): 8.6 mg via ORAL
  Filled 2014-07-03 (×6): qty 1

## 2014-07-03 MED ORDER — DEXTROSE 5 % IV SOLN: 1.0000 g | INTRAVENOUS | Status: DC

## 2014-07-03 MED ORDER — SODIUM CHLORIDE 0.9 % IV SOLN: Freq: Once | INTRAVENOUS | Status: DC

## 2014-07-03 MED ORDER — SODIUM CHLORIDE 0.9 % IV SOLN
INTRAVENOUS | Status: AC
  Administered 2014-07-04: 11:00:00 via INTRAVENOUS

## 2014-07-03 MED ORDER — ONDANSETRON HCL 4 MG/2ML IJ SOLN
4.0000 mg | Freq: Once | INTRAMUSCULAR | Status: AC
  Administered 2014-07-03: 4 mg via INTRAVENOUS
  Filled 2014-07-03: qty 2

## 2014-07-03 MED ORDER — POTASSIUM CHLORIDE CRYS ER 20 MEQ PO TBCR
40.0000 meq | EXTENDED_RELEASE_TABLET | Freq: Once | ORAL | Status: AC
  Administered 2014-07-03: 40 meq via ORAL
  Filled 2014-07-03: qty 2

## 2014-07-03 MED ORDER — SPIRONOLACTONE 25 MG PO TABS
50.0000 mg | ORAL_TABLET | Freq: Every day | ORAL | Status: DC
  Administered 2014-07-05 – 2014-07-07 (×3): 50 mg via ORAL
  Filled 2014-07-03: qty 1
  Filled 2014-07-03: qty 2
  Filled 2014-07-03: qty 1
  Filled 2014-07-03 (×2): qty 2

## 2014-07-03 MED ORDER — ENSURE PUDDING PO PUDG
1.0000 | Freq: Three times a day (TID) | ORAL | Status: DC
Start: 2014-07-03 — End: 2014-07-07
  Administered 2014-07-04 – 2014-07-07 (×6): 1 via ORAL

## 2014-07-03 MED ORDER — TORSEMIDE 20 MG PO TABS
30.0000 mg | ORAL_TABLET | Freq: Every day | ORAL | Status: DC
  Filled 2014-07-03: qty 1

## 2014-07-03 MED ORDER — ONDANSETRON HCL 4 MG/2ML IJ SOLN: 4.0000 mg | Freq: Four times a day (QID) | INTRAMUSCULAR | Status: DC | PRN

## 2014-07-03 MED ORDER — MAGNESIUM SULFATE 2 GM/50ML IV SOLN
2.0000 g | Freq: Once | INTRAVENOUS | Status: AC
  Administered 2014-07-03: 2 g via INTRAVENOUS
  Filled 2014-07-03: qty 50

## 2014-07-03 MED ORDER — PANTOPRAZOLE SODIUM 40 MG IV SOLR
40.0000 mg | Freq: Once | INTRAVENOUS | Status: AC
  Administered 2014-07-03: 40 mg via INTRAVENOUS
  Filled 2014-07-03: qty 40

## 2014-07-03 MED ORDER — MIDODRINE HCL 5 MG PO TABS
10.0000 mg | ORAL_TABLET | Freq: Two times a day (BID) | ORAL | Status: DC
  Administered 2014-07-03 – 2014-07-07 (×7): 10 mg via ORAL
  Filled 2014-07-03 (×7): qty 2

## 2014-07-03 MED ORDER — ONDANSETRON HCL 4 MG PO TABS: 4.0000 mg | ORAL_TABLET | Freq: Four times a day (QID) | ORAL | Status: DC | PRN

## 2014-07-03 MED ORDER — OXYCODONE HCL 5 MG PO TABS
10.0000 mg | ORAL_TABLET | Freq: Four times a day (QID) | ORAL | Status: DC | PRN
  Administered 2014-07-03 – 2014-07-07 (×14): 10 mg via ORAL
  Filled 2014-07-03 (×14): qty 2

## 2014-07-03 MED ORDER — SODIUM CHLORIDE 0.9 % IV SOLN
INTRAVENOUS | Status: DC
  Administered 2014-07-04: 13:00:00 via INTRAVENOUS
  Administered 2014-07-04: 1000 mL via INTRAVENOUS
  Administered 2014-07-05 – 2014-07-06 (×3): via INTRAVENOUS

## 2014-07-03 MED ORDER — DEXTROSE 5 % IV SOLN
1.0000 g | INTRAVENOUS | Status: DC
  Filled 2014-07-03: qty 10

## 2014-07-03 MED ORDER — SODIUM CHLORIDE 0.9 % IV SOLN
8.0000 mg/h | INTRAVENOUS | Status: DC
  Administered 2014-07-03 – 2014-07-06 (×6): 8 mg/h via INTRAVENOUS
  Filled 2014-07-03 (×17): qty 80

## 2014-07-03 MED ORDER — POTASSIUM CHLORIDE 10 MEQ/100ML IV SOLN
10.0000 meq | INTRAVENOUS | Status: AC
  Administered 2014-07-03 (×2): 10 meq via INTRAVENOUS
  Filled 2014-07-03: qty 100

## 2014-07-03 MED ORDER — DEXTROSE 5 % IV SOLN: 1.0000 g | Freq: Once | INTRAVENOUS | Status: DC

## 2014-07-03 MED ORDER — POLYETHYLENE GLYCOL 3350 17 G PO PACK: 17.0000 g | PACK | Freq: Every day | ORAL | Status: DC | PRN

## 2014-07-03 NOTE — Telephone Encounter (Signed)
Is calling because she is vomiting dark stuff up (like maybe blood). It has only happened once. She is worried. No pain or fever. Please advise

## 2014-07-03 NOTE — Telephone Encounter (Signed)
Discussed with Dr. Gala Romney.  If patient is passing black stools, has repeated episodes of bloody emesis, feels lightheaded she should go directly to ER.   Otherwise,   He recommends offering patient outpatient EGD with variceal banding tomorrow. Given phenergan 12.5mg  IV 30 minutes before. Check STAT CBC upon arrival to endo.

## 2014-07-03 NOTE — H&P (Signed)
[facility] History and Physical  Wanda [name] GGE:[id number] DOB: 1975/12/28 DOA: 07/03/2014  Referring physician: Dr Wyvonnia Dusky - [facility] PCP: Default, Provider, MD   Chief Complaint: hematemesis   HPI: Wanda [name] is a 38 y.o. female  Nausea and hematemesis started last night. Emesis described as dark brown and coffee ground like. 2 total episodes prior to presentation to [place]. Called Dr. Roseanne Kaufman office and was told to come to ED for evaluation. Associated w/ dark stools and a pressure feeling. Denies fevers, abd pain, dysuria, CP, palpitaitons, SOB, syncope, AMS. Last paracentesis earlier in November.     Review of Systems:  Constitutional:  No weight loss, night sweats, Fevers, chills, fatigue.  HEENT:  No headaches, Difficulty swallowing,Tooth/dental problems,Sore throat,  No sneezing, itching, ear ache, nasal congestion, post nasal drip,  Cardio-vascular:  No chest pain, Orthopnea, PND, swelling in lower extremities, anasarca, dizziness, palpitations  GI:  Per HPI Resp:  No shortness of breath with exertion or at rest. No excess mucus, no productive cough, No non-productive cough, No coughing up of blood.No change in color of mucus.No wheezing.No chest wall deformity  Skin:  no rash or lesions.  GU:  no dysuria, change in color of urine, no urgency or frequency. No flank pain.  Musculoskeletal:  No joint pain or swelling. No decreased range of motion. No back pain.  Psych:  No change in mood or affect. No depression or anxiety. No memory loss.   Past Medical History  Diagnosis Date  . GERD (gastroesophageal reflux disease)   . 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 c/w steatohepatitis  . Folate deficiency 09/2013  . Anasarca 10/10/2013  . Hematemesis/vomiting blood 02/24/2014  . Acute blood loss anemia 02/25/2014    Status post transfusion  . Macrocytosis 02/28/2014  . Bleeding esophageal varices 02/28/2014    s/p  banding  . Acute renal failure 09/2013    Pre-renal- resolved  . C. difficile colitis 04/19/2014   Past Surgical History  Procedure Laterality Date  . None    . Paracentesis  Feb 2015    1180 fluid, negative fluid analysis.   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 Paracentesis  10/2013  . Colonoscopy N/A 12/19/2013    SLF:NO OBVIOUS SOURCE FOR ANEMIA IDETIFIED/ONE COLON POLYP REMOVED/Small internal hemorrhoids  . Esophagogastroduodenoscopy N/A 02/11/2014    Dr. Rourk:Esophageal varices with bleeding stigmata-status post esophageal band ligation therapy. Portal gastropathy   Social History:  reports that she quit smoking about 5 years ago. Her smoking use included Cigarettes. She has a 5 pack-year smoking history. She has never used smokeless tobacco. She reports that she does not drink alcohol or use illicit drugs.  Allergies  Allergen Reactions  . Lasix [Furosemide]     "doesn't work"    Family History  Problem Relation Age of Onset  . Heart disease Mother   . Colon cancer Neg Hx   . Liver disease Neg Hx      Prior to Admission medications   Medication Sig Start Date End Date Taking? Authorizing Provider  loperamide (IMODIUM) 2 MG capsule Take 1 capsule (2 mg total) by mouth 2 (two) times daily as needed for diarrhea or loose stools. 05/18/14  Yes Maryann Mikhail, DO  loratadine (CLARITIN) 10 MG tablet Take 1 tablet (10 mg total) by mouth daily. Patient taking differently: Take 10  mg by mouth daily as needed for allergies.  04/27/14  Yes Rexene Alberts, MD  magnesium oxide (MAG-OX) 400 (241.3 MG) MG tablet Take 1 tablet (400 mg total) by mouth daily. 04/27/14  Yes Rexene Alberts, MD  midodrine (PROAMATINE) 10 MG tablet Take 1 tablet (10 mg total) by mouth 2 (two) times daily with a meal. Medication to keep your blood pressure from falling. 04/27/14   Yes Rexene Alberts, MD  oxyCODONE 10 MG TABS Take 1 tablet (10 mg total) by mouth every 6 (six) hours as needed for moderate pain. 05/18/14  Yes Maryann Mikhail, DO  pantoprazole (PROTONIX) 40 MG tablet Restart this medication 1 tablet 2 times daily after you have completed taking the course of Vancomycin. For management of GI bleeding. Patient taking differently: Take 20 mg by mouth 2 (two) times daily.  04/27/14  Yes Rexene Alberts, MD  spironolactone (ALDACTONE) 50 MG tablet Start this 50 mg tablet 1 time daily after you finish your 25 mg tablets. Patient taking differently: Take 50 mg by mouth daily.  04/27/14  Yes Rexene Alberts, MD  torsemide (DEMADEX) 10 MG tablet Take 3 tablets (30 mg total) by mouth daily. 04/27/14  Yes Rexene Alberts, MD  metroNIDAZOLE (FLAGYL) 500 MG tablet Take 1 tablet (500 mg total) by mouth every 8 (eight) hours. Patient not taking: Reported on 06/22/2014 05/18/14   [name], DO  potassium chloride (MICRO-K) 10 MEQ CR capsule Take 10 mEq by mouth 3 (three) times daily. 03/02/14   Historical Provider, MD   Physical Exam: Filed Vitals:   07/03/14 1519 07/03/14 1530 07/03/14 1630 07/03/14 1700  BP: 116/75 128/91 110/75 104/62  Pulse: 124  103 105  Temp: 98.5 F (36.9 C)     TempSrc: Oral     Resp: 16  18 22   Height: 5\' 2"  (1.575 m)     Weight: 54.432 kg (120 lb)     SpO2: 99%       Wt Readings from Last 3 Encounters:  07/03/14 54.432 kg (120 lb)  06/13/14 51.71 kg (114 lb)  05/18/14 53.2 kg (117 lb 4.6 oz)    General: Appears calm and comfortable Eyes: mild scleral icterus ENT:  grossly normal hearing, lips & tongue Neck:  no LAD, masses or thyromegaly Cardiovascular:  RRR, II/VI systolic murmur JVD bilat, No LE edema. Telemetry: sinus tach, no arrhythmias  Respiratory:  CTA bilaterally, no w/r/r. Normal respiratory effort. Abdomen: distended, soft, nonttp, NABS, fluid wave present Skin: jaundiced, no rash or induration seen on limited  exam Musculoskeletal:  grossly normal tone BUE/BLE Psychiatric:  grossly normal mood and affect, speech fluent and appropriate Neurologic:  grossly non-focal.          Labs on Admission:  Basic Metabolic Panel:  Recent Labs Lab 07/03/14 1542  NA 139  K 2.7*  CL 95*  CO2 24  GLUCOSE 80  BUN 32*  CREATININE 1.22*  CALCIUM 8.7  MG 1.1*   Liver Function Tests:  Recent Labs Lab 07/03/14 1542  AST 73*  ALT 8  ALKPHOS 90  BILITOT 3.0*  PROT 7.7  ALBUMIN 2.6*    Recent Labs Lab 07/03/14 1542  LIPASE 52    Recent Labs Lab 07/03/14 1543  AMMONIA 34   CBC:  Recent Labs Lab 07/03/14 1542  WBC 8.0  NEUTROABS 5.8  HGB 6.7*  HCT 19.8*  MCV 106.5*  PLT 102*   Cardiac Enzymes:  Recent Labs Lab 07/03/14 1542  TROPONINI <0.30    BNP (  last 3 results) No results for input(s): PROBNP in the last 8760 hours. CBG: No results for input(s): GLUCAP in the last 168 hours.  Radiological Exams on Admission: No results found.  EKG: Independently reviewed. Sinus tach. No ACS  Assessment/Plan Principal Problem:   Hematemesis Active Problems:   Sinus tachycardia   Cirrhosis   Anemia   Hypokalemia   Hypomagnesemia   Protein calorie malnutrition   Hypotension  38yo F w/ PMH of Cirrhosis (NASH), anemia, GERD, hypokalemia presenting w/ multiple bouts of hematemesis and anemia.  Hematemesis: likely secondary to bleeding verices vs gastric ulcer. Dr. Gala Romney consulted by ED and will see pt tonight.  - Admit - NPO - f/u GI as likely to scope tonight or in the am - continue octreotide - Protonix drip  Anemia: Hgb 6.7, baseline 8. Acute blood loss anemia on compounded by anemia of chronic disease.  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 MELD score 16 (improved). Ascitic abdomen w/o sign of SBP. Last paracentesis earlier this month. CTX given in ED. Daily soft BM - consider adding  lactulose of this worsens (ammonia 34).  - monitor - consider paracentesis if abd fullness progresses.  - continue home torsemide and spironolactone.  - f/u GI recs.   Hypotension: stable and adequate. Mildly dehydrated. 1L bolus in ED w/ improvement.  - continue home midiodrine - NS 172ml/hr  Tachycardia: sinus. Likely secondary to dehydration and acute blood loss anemia. Improvement w/ fluids.  - monitor - fluids as above  Hypokalemia: 2.7 on admission. secondar to cirrhosis and lasix and hypomag (1.1 on admission). Pt not taking Magnesium at home.  EKG w/o arrhythmia. 30mEq K given in ED.  - Repeat K - Kdur 40 Qday (x1 now) - Repleat Mag (2gms)  Wt loss: 200lbs as of 09/11/13. Currently weighing 120lbs. Likely from protein calorie malnutrition - Nutrition consult - ensure between meals TID  Elevated Cr: Cr 1.22, baseline 1.0. Slight elevation likely from dehydration from GI loss. If Cr does not recover w/ fluids consider Dx of CKD.  - BMET in am   Code Status: FULL DVT Prophylaxis: none (GI bleed) Family Communication: none Disposition Plan: pending GI evaluation and improvement  Time spent: >70 min  [place], [facility] [email or web address] Password [email or web address]

## 2014-07-03 NOTE — Telephone Encounter (Signed)
Per instructions, if she is have black stools, she needs to go to ER. Option of elective EGD not available if she is having black stools. She can get in trouble very fast if she is having a variceal bleed.   I think she should go to the ER based on that history.

## 2014-07-03 NOTE — Telephone Encounter (Signed)
Talked with her and she stated that she did not know about going to the ER but I advise her to please go based on her history. She is set up for her EGD on 07/04/14 @ 9:45.

## 2014-07-03 NOTE — Telephone Encounter (Signed)
Talked with patient she would like to have the EGD done on 07/04/14. She is starting to have darker stools and feeling weak. She does not want to go to the ER if all possible. I told her that I would get her set up for the EGD.

## 2014-07-03 NOTE — ED Notes (Addendum)
Patient reports coughing up blood since last night; Called Dr. Roseanne Kaufman office and was advised to come to  ER; Patient reports coughing up dark brown blood x 2 times. Patient reports hx of bleeding esophageal varices

## 2014-07-03 NOTE — Telephone Encounter (Signed)
Noted  

## 2014-07-03 NOTE — ED Provider Notes (Signed)
CSN: [id number]     Arrival date & time 07/03/14  1516 History   This chart was scribed for Wanda Essex, MD by Lowella Petties, ED Scribe. The patient was seen in room APA08/APA08. Patient's care was started at 3:25 PM.   Chief Complaint  Patient presents with  . Hematemesis   The history is provided by the patient. No language interpreter was used.   HPI Comments: Wanda [name] is a 38 y.o. female with a history of Cirrhosis who presents to the Emergency Department complaining of hematemesis. She reports nausea and two episodes of vomiting last night and today with dark brown, coffee ground like, emesis. She denies seeing a red color in the emesis. She states that she called Dr. Roseanne Kaufman office today and they told her to come here because there might be blood in her emesis. She reports a history of esophageal varices and similar symptoms in July. She reports "pressure" in her abdomen, darker than normal stools, and generalized weakness. She reports having a paracentesis here. She denies abdominal pain.    PCP: health clinic  Past Medical History  Diagnosis Date  . GERD (gastroesophageal reflux disease)   . 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 c/w steatohepatitis  . Folate deficiency 09/2013  . Anasarca 10/10/2013  . Hematemesis/vomiting blood 02/24/2014  . Acute blood loss anemia 02/25/2014    Status post transfusion  . Macrocytosis 02/28/2014  . 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 Acute renal failure 09/2013    Pre-renal- resolved  . C. difficile colitis 04/19/2014   Past Surgical History  Procedure Laterality Date  . None    . Paracentesis  Feb 2015    1180 fluid, negative fluid analysis.   Wanda [name] Esophagogastroduodenoscopy N/A 11/14/2013    SLF:1 column of very small varices in distal esopahgus/MODERATE PORTAL GASTROPATHY IN PROXIMAL STOMACH/MODERATE erosive gastritis  . Paracentesis  10/2013  . Colonoscopy N/A 12/19/2013     SLF:NO OBVIOUS SOURCE FOR ANEMIA IDETIFIED/ONE COLON POLYP REMOVED/Small internal hemorrhoids  . Esophagogastroduodenoscopy N/A 02/11/2014    Dr. Rourk:Esophageal varices with bleeding stigmata-status post esophageal band ligation therapy. Portal gastropathy   Family History  Problem Relation Age of Onset  . Heart disease Mother   . Colon cancer Neg Hx   . Liver disease Neg Hx    History  Substance Use Topics  . Smoking status: Former Smoker -- 0.25 packs/day for 20 years    Types: Cigarettes    Quit date: 11/05/2008  . Smokeless tobacco: Never Used  . Alcohol Use: No   OB History    Gravida Para Term Preterm AB TAB SAB Ectopic Multiple Living   1 1 1       1      Review of Systems  Gastrointestinal: Positive for vomiting (hematemesis).  A complete 10 system review of systems was obtained and all systems are negative except as noted in the HPI and PMH.   Allergies  Lasix  Home Medications   Prior to Admission medications   Medication Sig Start Date End Date Taking? Authorizing Provider  loperamide (IMODIUM) 2 MG capsule Take 1 capsule (2 mg total) by mouth 2 (two) times daily as needed for diarrhea or loose stools. 05/18/14  Yes Maryann Mikhail, DO  loratadine (CLARITIN) 10 MG tablet Take 1 tablet (10 mg total) by mouth daily. Patient taking differently: Take 10 mg by mouth daily as needed for allergies.  04/27/14  Yes Rexene Alberts, MD  magnesium oxide (MAG-OX) 400 (241.3 MG) MG tablet Take 1 tablet (400 mg total) by mouth daily. 04/27/14  Yes Rexene Alberts, MD  midodrine (PROAMATINE) 10 MG tablet Take 1 tablet (10 mg total) by mouth 2 (two) times daily with a meal. Medication to keep your blood pressure from falling. 04/27/14  Yes Rexene Alberts, MD  oxyCODONE 10 MG TABS Take 1 tablet (10 mg total) by mouth every 6 (six) hours as needed for moderate pain. 05/18/14  Yes Maryann Mikhail, DO  pantoprazole (PROTONIX) 40 MG tablet Restart this medication 1 tablet 2 times daily after  you have completed taking the course of Vancomycin. For management of GI bleeding. Patient taking differently: Take 20 mg by mouth 2 (two) times daily.  04/27/14  Yes Rexene Alberts, MD  spironolactone (ALDACTONE) 50 MG tablet Start this 50 mg tablet 1 time daily after you finish your 25 mg tablets. Patient taking differently: Take 50 mg by mouth daily.  04/27/14  Yes Rexene Alberts, MD  torsemide (DEMADEX) 10 MG tablet Take 3 tablets (30 mg total) by mouth daily. 04/27/14  Yes Rexene Alberts, MD  metroNIDAZOLE (FLAGYL) 500 MG tablet Take 1 tablet (500 mg total) by mouth every 8 (eight) hours. Patient not taking: Reported on 06/22/2014 05/18/14   Wanda Addison Mikhail, DO  potassium chloride (MICRO-K) 10 MEQ CR capsule Take 10 mEq by mouth 3 (three) times daily. 03/02/14   Historical Provider, MD   Triage Vitals: BP 116/75 mmHg  Pulse 124  Temp(Src) 98.5 F (36.9 C) (Oral)  Resp 16  Ht 5\' 2"  (1.575 m)  Wt 120 lb (54.432 kg)  BMI 21.94 kg/m2  SpO2 99% Physical Exam  Constitutional: She is oriented to person, place, and time. No distress.  Chronically ill appearing. Jaundiced.   HENT:  Head: Normocephalic and atraumatic.  Mouth/Throat: Oropharynx is clear and moist. No oropharyngeal exudate.  Eyes: Conjunctivae and EOM are normal. Pupils are equal, round, and reactive to light.  Neck: Normal range of motion. Neck supple.  No meningismus.  Cardiovascular: Regular rhythm, normal heart sounds and intact distal pulses.  Tachycardia present.   No murmur heard. Pulmonary/Chest: Effort normal and breath sounds normal. No respiratory distress.  Abdominal: Soft. She exhibits distension. There is no tenderness. There is no rebound and no guarding.  + ascites  Genitourinary:  Chaperone present, no gross blood, guaiac positive.   Musculoskeletal: Normal range of motion. She exhibits no edema or tenderness.  Neurological: She is alert and oriented to person, place, and time. No cranial nerve deficit. She  exhibits normal muscle tone. Coordination normal.  No ataxia on finger to nose bilaterally. No pronator drift. 5/5 strength throughout. CN 2-12 intact. Negative Romberg. Equal grip strength. Sensation intact. Gait is normal. No asterixis. Alert and oriented x3  Skin: Skin is warm.  Psychiatric: She has a normal mood and affect. Her behavior is normal.  Nursing note and vitals reviewed.   ED Course  Procedures (including critical care time) DIAGNOSTIC STUDIES: Oxygen Saturation is 99% on room air, normal by my interpretation.    COORDINATION OF CARE: 3:36 PM-Discussed treatment plan which includes lab work, Zofran, and O2, cardiac monitoring with pt at bedside and pt agreed to plan.   Labs Review Labs Reviewed  CBC WITH DIFFERENTIAL - Abnormal; Notable for the following:    RBC 1.86 (*)    Hemoglobin 6.7 (*)    HCT 19.8 (*)    MCV 106.5 (*)    [facility]  36.0 (*)    RDW 17.3 (*)    Platelets 102 (*)    All other components within normal limits  COMPREHENSIVE METABOLIC PANEL - Abnormal; Notable for the following:    Potassium 2.7 (*)    Chloride 95 (*)    BUN 32 (*)    Creatinine, Ser 1.22 (*)    Albumin 2.6 (*)    AST 73 (*)    Total Bilirubin 3.0 (*)    GFR calc non Af Amer 55 (*)    GFR calc Af Amer 63 (*)    Anion gap 20 (*)    All other components within normal limits  PROTIME-INR - Abnormal; Notable for the following:    Prothrombin Time 16.7 (*)    All other components within normal limits  MAGNESIUM - Abnormal; Notable for the following:    Magnesium 1.1 (*)    All other components within normal limits  HEMOGLOBIN AND HEMATOCRIT, BLOOD - Abnormal; Notable for the following:    Hemoglobin 6.1 (*)    HCT 18.3 (*)    All other components within normal limits  BASIC METABOLIC PANEL - Abnormal; Notable for the following:    BUN 32 (*)    Creatinine, Ser 1.19 (*)    Calcium 8.1 (*)    GFR calc non Af Amer 56 (*)    GFR calc Af Amer 65 (*)    Anion gap 18 (*)    All  other components within normal limits  POC OCCULT BLOOD, ED - Abnormal; Notable for the following:    Fecal Occult Bld POSITIVE (*)    All other components within normal limits  MRSA PCR SCREENING  LIPASE, BLOOD  APTT  AMMONIA  TROPONIN I  HEMOGLOBIN AND HEMATOCRIT, BLOOD  CBC  PROTIME-INR  COMPREHENSIVE METABOLIC PANEL  I-STAT CG4 LACTIC ACID, ED  TYPE AND SCREEN  PREPARE RBC (CROSSMATCH)  PREPARE RBC (CROSSMATCH)    Imaging Review No results found.   EKG Interpretation None      MDM   Final diagnoses:  Hematemesis with nausea   patient reports 2 episodes of coffee-ground emesis since last night. History of esophageal varices, cirrhosis, rash. Denies any abdominal pain or chest pain. Denies any dizziness or lightheadedness. She is tachycardic  Fecal blood is positive. Abdomen is soft and nontender. She does have ascites.  Hemoglobin 6.7 from baseline of 8.5-9. Patient agreeable to blood transfusion. Discussed with Dr. Buford Dresser who will consult on patient and agrees with IV PPI, octreotide, antibiotics.  IV access x3.  HR improving to 100s.  BP 628-366Q systolic.  No vomiting in the ED. Dr. Buford Dresser to consult, Dr. Marily Memos to admit to ICU at [facility].  CRITICAL CARE Performed by: Wanda [name] Total critical care time:35 Critical care time was exclusive of separately billable procedures and treating other patients. Critical care was necessary to treat or prevent imminent or life-threatening deterioration. Critical care was time spent personally by me on the following activities: development of treatment plan with patient and/or surrogate as well as nursing, discussions with consultants, evaluation of patient's response to treatment, examination of patient, obtaining history from patient or surrogate, ordering and performing treatments and interventions, ordering and review of laboratory studies, ordering and review of radiographic studies, pulse oximetry and re-evaluation of  patient's condition.   I personally performed the services described in this documentation, which was scribed in my presence. The recorded information has been reviewed and is accurate.     Wanda Essex, MD 07/03/14  2336 

## 2014-07-03 NOTE — ED Notes (Signed)
CRITICAL VALUE ALERT  Critical value received:  Postassium 2.7  Date of notification:  07/03/14  Time of notification:  9892  Critical value read back:Yes.    Nurse who received alert:  S. Raquelle Pietro RN  MD notified (1st page):  DR. Wyvonnia Dusky Time of first page:  1645  MD notified (2nd page):  Time of second page:  Responding MD:  Dr. Wyvonnia Dusky  Time MD responded:  [phone]

## 2014-07-03 NOTE — Care Management Note (Signed)
ED/CM noted patient did not have health insurance and/or PCP listed in the computer.  Patient was given the [place] resource handout with information on the clinics, food pantries, and the handout for new health insurance sign-up.  Patient expressed appreciation for information received. 

## 2014-07-03 NOTE — Consult Note (Signed)
Referring Provider: Dr. Ezequiel Essex Primary Gastroenterologist:  Dr. Oneida Alar   Date of Admission: 07/03/14 Date of Consultation: 07/03/14  Reason for Consultation:  Hematemesis  HPI:  Wanda [name] is a 38 year old female well-known to our practice with a history of multiple prior hospitalizations secondary to decompensated cirrhosis, presumed NASH (liver biopsy), presenting with hematemesis. Last EGD in July 2015 with 4 columns of Grade 2 varices s/p banding, portal gastropathy. She never completed early interval EGD for reassessment. Last seen as an outpatient in early November this year. She has declined referral for liver transplant evaluation in the past.   Admitting Hgb 6.7. Baseline 8/9 range. Called our office this morning with reports of dark emesis, felt it was blood Felt weak. States was dark brown, some coffee-ground appearing emesis. Dark stool. Poor historian. No bright red blood in emesis. Denies abdominal pain. Last episode of hematemesis prior to calling 911. States significant anxiety at home but won't elaborate. Denies NSAIDs, aspirin powders.    Past Medical History  Diagnosis Date  . GERD (gastroesophageal reflux disease)   . 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 c/w steatohepatitis  . Folate deficiency 09/2013  . Anasarca 10/10/2013  . Hematemesis/vomiting blood 02/24/2014  . Acute blood loss anemia 02/25/2014    Status post transfusion  . Macrocytosis 02/28/2014  . 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 Acute renal failure 09/2013    Pre-renal- resolved  . C. difficile colitis 04/19/2014    Past Surgical History  Procedure Laterality Date  . None    . Paracentesis  Feb 2015    1180 fluid, negative fluid analysis.   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  Paracentesis  10/2013  . Colonoscopy N/A 12/19/2013    SLF:NO OBVIOUS SOURCE FOR ANEMIA IDETIFIED/ONE COLON POLYP REMOVED/Small internal hemorrhoids  . Esophagogastroduodenoscopy N/A 02/11/2014    Dr. Aireonna Bauer:Esophageal varices with bleeding stigmata-status post esophageal band ligation therapy. Portal gastropathy    Prior to Admission medications   Medication Sig Start Date End Date Taking? Authorizing Provider  loperamide (IMODIUM) 2 MG capsule Take 1 capsule (2 mg total) by mouth 2 (two) times daily as needed for diarrhea or loose stools. 05/18/14  Yes Maryann Mikhail, DO  loratadine (CLARITIN) 10 MG tablet Take 1 tablet (10 mg total) by mouth daily. Patient taking differently: Take 10 mg by mouth daily as needed for allergies.  04/27/14  Yes Rexene Alberts, MD  magnesium oxide (MAG-OX) 400 (241.3 MG) MG tablet Take 1 tablet (400 mg total) by mouth daily. 04/27/14  Yes Rexene Alberts, MD  midodrine (PROAMATINE) 10 MG tablet Take 1 tablet (10 mg total) by mouth 2 (two) times daily with a meal. Medication to keep your blood pressure from falling. 04/27/14  Yes Rexene Alberts, MD  oxyCODONE 10 MG TABS Take 1 tablet (10 mg total) by mouth every 6 (six) hours as needed for moderate pain. 05/18/14  Yes Maryann Mikhail, DO  pantoprazole (PROTONIX) 40 MG tablet Restart this medication 1 tablet 2 times daily after you have completed taking the course of Vancomycin. For management of GI bleeding. Patient taking differently: Take 20 mg by mouth 2 (two) times daily.  04/27/14  Yes Rexene Alberts, MD  spironolactone (ALDACTONE) 50 MG tablet Start this 50 mg tablet 1 time daily after you finish your 25 mg  tablets. Patient taking differently: Take 50 mg by mouth daily.  04/27/14  Yes Rexene Alberts, MD  torsemide (DEMADEX) 10 MG tablet Take 3 tablets (30 mg total) by mouth daily. 04/27/14  Yes Rexene Alberts, MD  metroNIDAZOLE (FLAGYL) 500 MG tablet Take 1 tablet (500 mg total) by mouth every 8 (eight) hours. Patient not  taking: Reported on 06/22/2014 05/18/14   [name], DO  potassium chloride (MICRO-K) 10 MEQ CR capsule Take 10 mEq by mouth 3 (three) times daily. 03/02/14   Historical Provider, MD    Current Facility-Administered Medications  Medication Dose Route Frequency Provider Last Rate Last Dose  . cefTRIAXone (ROCEPHIN) 1 g in dextrose 5 % 50 mL IVPB  1 g Intravenous Once Ezequiel Essex, MD      . octreotide (SANDOSTATIN) 500 mcg in sodium chloride 0.9 % 250 mL (2 mcg/mL) infusion  50 mcg/hr Intravenous Continuous Ezequiel Essex, MD 25 mL/hr at 07/03/14 1610 50 mcg/hr at 07/03/14 1610  . pantoprazole (PROTONIX) 80 mg in sodium chloride 0.9 % 250 mL (0.32 mg/mL) infusion  8 mg/hr Intravenous Continuous Ezequiel Essex, MD 25 mL/hr at 07/03/14 1624 8 mg/hr at 07/03/14 1624  . potassium chloride 10 mEq in 100 mL IVPB  10 mEq Intravenous Q1 Hr x 2 Ezequiel Essex, MD       Current Outpatient Prescriptions  Medication Sig Dispense Refill  . loperamide (IMODIUM) 2 MG capsule Take 1 capsule (2 mg total) by mouth 2 (two) times daily as needed for diarrhea or loose stools. 30 capsule 0  . loratadine (CLARITIN) 10 MG tablet Take 1 tablet (10 mg total) by mouth daily. (Patient taking differently: Take 10 mg by mouth daily as needed for allergies. )    . magnesium oxide (MAG-OX) 400 (241.3 MG) MG tablet Take 1 tablet (400 mg total) by mouth daily. 30 tablet 3  . midodrine (PROAMATINE) 10 MG tablet Take 1 tablet (10 mg total) by mouth 2 (two) times daily with a meal. Medication to keep your blood pressure from falling. 60 tablet 3  . oxyCODONE 10 MG TABS Take 1 tablet (10 mg total) by mouth every 6 (six) hours as needed for moderate pain. 30 tablet 0  . pantoprazole (PROTONIX) 40 MG tablet Restart this medication 1 tablet 2 times daily after you have completed taking the course of Vancomycin. For management of GI bleeding. (Patient taking differently: Take 20 mg by mouth 2 (two) times daily. )    .  spironolactone (ALDACTONE) 50 MG tablet Start this 50 mg tablet 1 time daily after you finish your 25 mg tablets. (Patient taking differently: Take 50 mg by mouth daily. ) 30 tablet 3  . torsemide (DEMADEX) 10 MG tablet Take 3 tablets (30 mg total) by mouth daily. 90 tablet 3  . metroNIDAZOLE (FLAGYL) 500 MG tablet Take 1 tablet (500 mg total) by mouth every 8 (eight) hours. (Patient not taking: Reported on 06/22/2014) 24 tablet 0  . potassium chloride (MICRO-K) 10 MEQ CR capsule Take 10 mEq by mouth 3 (three) times daily.      Allergies as of 07/03/2014 - Review Complete 07/03/2014  Allergen Reaction Noted  . Lasix [furosemide]  04/12/2014    Family History  Problem Relation Age of Onset  . Heart disease Mother   . Colon cancer Neg Hx   . Liver disease Neg Hx     History   Social History  . Marital Status: Single    Spouse Name: N/A    Number  of Children: 1  . Years of Education: N/A   Occupational History  . unemployed    Social History Main Topics  . Smoking status: Former Smoker -- 0.25 packs/day for 20 years    Types: Cigarettes    Quit date: 11/05/2008  . Smokeless tobacco: Never Used  . Alcohol Use: No  . Drug Use: No  . Sexual Activity:    Partners: Male    Birth Control/ Protection: None   Other Topics Concern  . Not on file   Social History Narrative    Review of Systems: Gen: Denies fever, chills, loss of appetite, change in weight or weight loss CV: Denies chest pain, heart palpitations, syncope, edema  Resp: Denies shortness of breath with rest, cough, wheezing GI: see HPI GU : Denies urinary burning, urinary frequency, urinary incontinence.  MS: intermittent lower extremity edema Derm: +dry skin Psych: +anxiety Heme: Denies bruising, bleeding, and enlarged lymph nodes.  Physical Exam: Vital signs in last 24 hours: Temp:  [98.5 F (36.9 C)] 98.5 F (36.9 C) (11/24 1519) Pulse Rate:  [124] 124 (11/24 1519) Resp:  [16] 16 (11/24 1519) BP:  (116-128)/(75-91) 128/91 mmHg (11/24 1530) SpO2:  [99 %] 99 % (11/24 1519) Weight:  [120 lb (54.432 kg)] 120 lb (54.432 kg) (11/24 1519)   General:   Alert, chronically-ill appearing, muscle wasting bilateral upper and lower extremities Head:  Normocephalic and atraumatic. Eyes:  Scleral icterus Ears:  Normal auditory acuity. Nose:  No deformity, discharge,  or lesions. Mouth:  No deformity or lesions, dentition normal. Heart:  S1 S2 present, tachycardic Abdomen:  Soft, +BS, +fluid wave consistent with ascites but non-tense. Liver margin palpable below right costal margin Rectal:  Deferred  Msk:  Symmetrical without gross deformities.  Extremities:  Without edema. Neurologic:  Alert and  oriented x4 Skin:  Jaundice Psych:  Alert and cooperative. Flat affect  Intake/Output from previous day:   Intake/Output this shift:    Lab Results:  Recent Labs  07/03/14 1542  WBC 8.0  HGB 6.7*  HCT 19.8*  PLT 102*   Lab Results  Component Value Date   ALT 8 07/03/2014   AST 73* 07/03/2014   ALKPHOS 90 07/03/2014   BILITOT 3.0* 07/03/2014   Lab Results  Component Value Date   CREATININE 1.22* 07/03/2014   BUN 32* 07/03/2014   NA 139 07/03/2014   K 2.7* 07/03/2014   CL 95* 07/03/2014   CO2 24 07/03/2014   PT/INR  Recent Labs  07/03/14 1542  LABPROT 16.7*  INR 1.34    Impression: 38 year old female with decompensated NASH cirrhosis and likely variceal bleed, acute on chronic anemia, hypokalemia, chronic renal failure, with need for endoscopic intervention after stabilized. Currently without further hematemesis for several hours; she is overall a poor historian and difficult to illicit specific information without repeated questioning. As of note, she does deny NSAIDs, aspirin powder usage. Last EGD with variceal banding in July 2015; early interval reassessment of varices not completed likely due to multiple hospitalizations. She is up-to-date on hepatoma surveillance with  last ultrasound this month. However, she has lost 50 lbs since Feb 2015, which is concerning.    Continue PPI infusion, Octreotide drip, Rocephin scheduled for prophylaxis, serial H/H, keep 4 units of PRBCs ahead in blood bank. Will reassess tomorrow morning prior to endoscopic evaluation, as she may need elective intubation for airway protection. EGD to be performed by Dr. Gala Romney on 11/25. Patient states understanding regarding plan of care. Risks and  benefits discussed in detail.   Plan: Protonix infusion Octreotide infusion Rocephin for antibiotic prophylaxis Serial H/H Will keep 4 units PRBCs ahead in blood bank EGD on 11/25 with Dr. Gala Romney  NPO Will continue to follow with you  Orvil Feil, ANP-BC [facility]     LOS: 0 days    07/03/2014, 5:04 PM   Attending note:  Patient seen and examined in emergency department room 8 this evening. She needs resuscitation prior to EGD. She tells me she is now interested in undergoing a transplant evaluation. Will get the ball rolling once she is over this acute illness. In addition, she will need to be on a non-specific beta blocker at discharge regardless of EGD findings tomorrow.

## 2014-07-03 NOTE — Telephone Encounter (Addendum)
I spoke with patient. Stools are darker than normal. Vomit dark brown stuff last night. Stools started turning dark with it. Several dark stools today. Feels weaker and light headed. Not sure if she can walk to car.   Advised her to call 911 and she agreed.   Doris called back after phone was disconnected and patient has called 911.

## 2014-07-04 ENCOUNTER — Inpatient Hospital Stay (HOSPITAL_COMMUNITY): Payer: Medicare Other | Admitting: Anesthesiology

## 2014-07-04 ENCOUNTER — Ambulatory Visit (HOSPITAL_COMMUNITY): Admission: RE | Admit: 2014-07-04 | Payer: Medicare Other | Source: Ambulatory Visit | Admitting: Internal Medicine

## 2014-07-04 ENCOUNTER — Telehealth: Payer: Self-pay | Admitting: Gastroenterology

## 2014-07-04 ENCOUNTER — Encounter (HOSPITAL_COMMUNITY): Payer: Self-pay | Admitting: Anesthesiology

## 2014-07-04 ENCOUNTER — Inpatient Hospital Stay (HOSPITAL_COMMUNITY): Payer: Medicare Other

## 2014-07-04 ENCOUNTER — Encounter (HOSPITAL_COMMUNITY)
Admission: EM | Disposition: A | Discharge status: Home / Self Care | Payer: Self-pay | Source: Home / Self Care | Attending: Internal Medicine

## 2014-07-04 DIAGNOSIS — E43 Unspecified severe protein-calorie malnutrition: Secondary | ICD-10-CM | POA: Insufficient documentation

## 2014-07-04 DIAGNOSIS — I8500 Esophageal varices without bleeding: Secondary | ICD-10-CM | POA: Insufficient documentation

## 2014-07-04 DIAGNOSIS — K766 Portal hypertension: Secondary | ICD-10-CM

## 2014-07-04 DIAGNOSIS — K7469 Other cirrhosis of liver: Secondary | ICD-10-CM

## 2014-07-04 DIAGNOSIS — K3189 Other diseases of stomach and duodenum: Secondary | ICD-10-CM | POA: Insufficient documentation

## 2014-07-04 DIAGNOSIS — I9589 Other hypotension: Secondary | ICD-10-CM

## 2014-07-04 HISTORY — PX: ESOPHAGOGASTRODUODENOSCOPY: SHX5428

## 2014-07-04 HISTORY — PX: ESOPHAGEAL BANDING: SHX5518

## 2014-07-04 LAB — BLOOD GAS, ARTERIAL
Acid-base deficit: 3.2 mmol/L — ABNORMAL HIGH (ref 0.0–2.0)
Acid-base deficit: 4.1 mmol/L — ABNORMAL HIGH (ref 0.0–2.0)
Bicarbonate: 20.3 mEq/L (ref 20.0–24.0)
Bicarbonate: 20 mEq/L (ref 20.0–24.0)
DRAWN BY: 234301
DRAWN BY: 234301
FIO2: 50 %
FIO2: 40 %
LHR: 15 {breaths}/min
MECHVT: 400 mL
Mode: POSITIVE
O2 SAT: 99.4 %
O2 SAT: 98.6 %
PEEP/CPAP: 5 cmH2O
PEEP/CPAP: 5 cmH2O
PH ART: 7.392 (ref 7.350–7.450)
Patient temperature: 37
Patient temperature: 37
Pressure support: 5 cmH2O
TCO2: 18.7 mmol/L (ref 0–100)
TCO2: 18.7 mmol/L (ref 0–100)
pCO2 arterial: 30.7 mmHg — ABNORMAL LOW (ref 35.0–45.0)
pCO2 arterial: 33.6 mmHg — ABNORMAL LOW (ref 35.0–45.0)
pH, Arterial: 7.437 (ref 7.350–7.450)
pO2, Arterial: 199 mmHg — ABNORMAL HIGH (ref 80.0–100.0)
pO2, Arterial: 129 mmHg — ABNORMAL HIGH (ref 80.0–100.0)

## 2014-07-04 LAB — COMPREHENSIVE METABOLIC PANEL
ALK PHOS: 91 U/L (ref 39–117)
ALT: 8 U/L (ref 0–35)
ANION GAP: 15 (ref 5–15)
AST: 71 U/L — ABNORMAL HIGH (ref 0–37)
Albumin: 2.5 g/dL — ABNORMAL LOW (ref 3.5–5.2)
BILIRUBIN TOTAL: 5.3 mg/dL — AB (ref 0.3–1.2)
BUN: 32 mg/dL — ABNORMAL HIGH (ref 6–23)
CHLORIDE: 101 meq/L (ref 96–112)
CO2: 22 mEq/L (ref 19–32)
Calcium: 8.5 mg/dL (ref 8.4–10.5)
Creatinine, Ser: 1.28 mg/dL — ABNORMAL HIGH (ref 0.50–1.10)
GFR calc non Af Amer: 52 mL/min — ABNORMAL LOW (ref >90)
GFR, EST AFRICAN AMERICAN: 60 mL/min — AB (ref >90)
GLUCOSE: 101 mg/dL — AB (ref 70–99)
POTASSIUM: 4.4 meq/L (ref 3.7–5.3)
Sodium: 138 mEq/L (ref 137–147)
TOTAL PROTEIN: 7.4 g/dL (ref 6.0–8.3)

## 2014-07-04 LAB — CBC
HCT: 26.3 % — ABNORMAL LOW (ref 36.0–46.0)
Hemoglobin: 8.9 g/dL — ABNORMAL LOW (ref 12.0–15.0)
MCH: 33.2 pg (ref 26.0–34.0)
MCHC: 33.8 g/dL (ref 30.0–36.0)
MCV: 98.1 fL (ref 78.0–100.0)
PLATELETS: 97 10*3/uL — AB (ref 150–400)
RBC: 2.68 MIL/uL — AB (ref 3.87–5.11)
RDW: 23.1 % — ABNORMAL HIGH (ref 11.5–15.5)
WBC: 6.1 10*3/uL (ref 4.0–10.5)

## 2014-07-04 LAB — PROTIME-INR
INR: 1.32 (ref 0.00–1.49)
Prothrombin Time: 16.5 seconds — ABNORMAL HIGH (ref 11.6–15.2)

## 2014-07-04 LAB — HEMOGLOBIN AND HEMATOCRIT, BLOOD
HCT: 25 % — ABNORMAL LOW (ref 36.0–46.0)
HCT: 26.4 % — ABNORMAL LOW (ref 36.0–46.0)
Hemoglobin: 8.3 g/dL — ABNORMAL LOW (ref 12.0–15.0)
Hemoglobin: 8.9 g/dL — ABNORMAL LOW (ref 12.0–15.0)

## 2014-07-04 SURGERY — EGD (ESOPHAGOGASTRODUODENOSCOPY)
Anesthesia: Moderate Sedation

## 2014-07-04 MED ORDER — ETHANOLAMINE OLEATE 5 % IV SOLN
INTRAVENOUS | Status: AC
  Filled 2014-07-04: qty 6

## 2014-07-04 MED ORDER — CEFTRIAXONE SODIUM IN DEXTROSE 40 MG/ML IV SOLN
2.0000 g | INTRAVENOUS | Status: DC
  Administered 2014-07-04 – 2014-07-07 (×4): 2 g via INTRAVENOUS
  Filled 2014-07-04 (×6): qty 50

## 2014-07-04 MED ORDER — CEFTRIAXONE SODIUM IN DEXTROSE 20 MG/ML IV SOLN
1.0000 g | INTRAVENOUS | Status: DC
  Filled 2014-07-04 (×3): qty 50

## 2014-07-04 MED ORDER — ALBUTEROL SULFATE (2.5 MG/3ML) 0.083% IN NEBU: 2.5000 mg | INHALATION_SOLUTION | RESPIRATORY_TRACT | Status: DC | PRN

## 2014-07-04 MED ORDER — PROPOFOL 10 MG/ML IV BOLUS
INTRAVENOUS | Status: DC | PRN
  Administered 2014-07-04: 100 mg via INTRAVENOUS

## 2014-07-04 MED ORDER — TORSEMIDE 20 MG PO TABS
30.0000 mg | ORAL_TABLET | Freq: Every day | ORAL | Status: DC
  Administered 2014-07-05 – 2014-07-07 (×3): 30 mg via ORAL
  Filled 2014-07-04 (×4): qty 2

## 2014-07-04 MED ORDER — PROPOFOL 10 MG/ML IV BOLUS
INTRAVENOUS | Status: AC
  Filled 2014-07-04: qty 20

## 2014-07-04 MED ORDER — PROPOFOL 10 MG/ML IV EMUL
5.0000 ug/kg/min | INTRAVENOUS | Status: DC
  Administered 2014-07-04: 50 ug/kg/min via INTRAVENOUS
  Filled 2014-07-04 (×2): qty 100

## 2014-07-04 MED ORDER — CETYLPYRIDINIUM CHLORIDE 0.05 % MT LIQD
7.0000 mL | Freq: Four times a day (QID) | OROMUCOSAL | Status: DC
  Administered 2014-07-04 – 2014-07-07 (×11): 7 mL via OROMUCOSAL

## 2014-07-04 MED ORDER — PROPOFOL 10 MG/ML IV EMUL
INTRAVENOUS | Status: AC
  Filled 2014-07-04: qty 100

## 2014-07-04 MED ORDER — MORPHINE SULFATE 2 MG/ML IJ SOLN
2.0000 mg | INTRAMUSCULAR | Status: DC | PRN
  Administered 2014-07-04 (×2): 2 mg via INTRAVENOUS
  Filled 2014-07-04 (×2): qty 1

## 2014-07-04 MED ORDER — MIDAZOLAM HCL 2 MG/2ML IJ SOLN
INTRAMUSCULAR | Status: AC
  Filled 2014-07-04: qty 4

## 2014-07-04 MED ORDER — STERILE WATER FOR IRRIGATION IR SOLN
Status: DC | PRN
  Administered 2014-07-04: 13:00:00

## 2014-07-04 MED ORDER — SUCCINYLCHOLINE CHLORIDE 20 MG/ML IJ SOLN
INTRAMUSCULAR | Status: DC | PRN
  Administered 2014-07-04: 80 mg via INTRAVENOUS

## 2014-07-04 MED ORDER — CHLORHEXIDINE GLUCONATE 0.12 % MT SOLN
15.0000 mL | Freq: Two times a day (BID) | OROMUCOSAL | Status: DC
  Administered 2014-07-04 – 2014-07-07 (×6): 15 mL via OROMUCOSAL
  Filled 2014-07-04 (×6): qty 15

## 2014-07-04 NOTE — Progress Notes (Signed)
Patient briefly seen this morning. Hgb improved after 2 units PRBCs. No further evidence of overt GI bleeding. Needs elective intubation for airway protection prior to EGD today. Called Melanie in endoscopy suite, who states the case will follow the OR case from this morning. Notified of plan. Contacted Kim, scheduling with anesthesia at [phone] regarding need for elective intubation. This was discussed with Maudie Mercury and Inez Catalina (charge nurse); Inez Catalina to inform CRNA of timing and need for elective intubation. I also notified Dr. Wyline Copas, with the hospitalist service, of elective intubation prior to EGD. Finally, Purcell Nails, ICU RN, notified of plan and expected time frame.   Patient is aware of plan and understands risks and benefits.   Orvil Feil, ANP-BC [facility]    Attending note Spoke with Mikki Santee, CRNA. Plan for elective intubation around noon. Procedure around 1:30 today.

## 2014-07-04 NOTE — Care Management Utilization Note (Signed)
UR review complete.  

## 2014-07-04 NOTE — Telephone Encounter (Signed)
Noted! Routing to Neil Crouch, PA who was already aware.

## 2014-07-04 NOTE — Plan of Care (Signed)
Problem: Phase I Progression Outcomes Goal: Pain controlled with appropriate interventions Outcome: Progressing Goal: Initial discharge plan identified Outcome: Progressing Goal: Voiding-avoid urinary catheter unless indicated Outcome: Completed/Met Date Met:  07/03/14 Goal: Hemodynamically stable Outcome: Progressing

## 2014-07-04 NOTE — Plan of Care (Signed)
Problem: Phase I Progression Outcomes Goal: Hemodynamically stable Outcome: Completed/Met Date Met:  07/04/14  Problem: Phase II Progression Outcomes Goal: No active bleeding Outcome: Completed/Met Date Met:  07/04/14 Goal: Progress activity as tolerated unless otherwise ordered Outcome: Progressing  Problem: Phase III Progression Outcomes Goal: Activity at appropriate level-compared to baseline (UP IN CHAIR FOR HEMODIALYSIS)  Outcome: Progressing

## 2014-07-04 NOTE — Telephone Encounter (Signed)
PATIENT BOYFRIEND CALLED WANTING TO LET THE OFFICE KNOW THAT THE PATIENT WAS ADMITTED TO [place].

## 2014-07-04 NOTE — Progress Notes (Signed)
[facility] PROGRESS NOTE  Wanda [name]:[id number] DOB: 29-Jan-1976 DOA: 07/03/2014 PCP: Default, Provider, MD  Assessment/Plan: 1. Acute blood loss anemia secondary to upper GI bleed 1. Hgb improved with prbc transfusion 2. Follow hgb and transfuse as needed 3. GI consulted 4. Pt for EGD today 2. Cirrhosis 1. Cont on lasix with spironolactone 2. Empiric rocephin was started in ED empirically secondary to soft BP 3. Hypotension 1. Suspect secondary to cirrhosis with acute blood loss anemia 4. Tachycardia 1. Suspect secondary to acute blood loss anemia with cirrhosis 2. Currently normal heart rate 5. Hypokalemia 1. Normalized 6. Unintentional weight loss 1. Nutrition consulted 7. DVT prophylaxis 1. SCD's  Code Status: Full Family Communication: Pt in room Disposition Plan: Pending  Consultants:  GI  Critical Care  Procedures:  Pending EGD  Antibiotics:  Rocephin 11/24>>>  HPI/Subjective: No complaints. No acute events.  Objective: Filed Vitals:   07/04/14 1340 07/04/14 1345 07/04/14 1350 07/04/14 1355  BP:  99/62    Pulse: 67 70 72   Temp:      TempSrc:      Resp: 15 16 15    Height:      Weight:      SpO2: 100% 100% 100% 100%    Intake/Output Summary (Last 24 hours) at 07/04/14 1409 Last data filed at 07/04/14 1129  Gross per 24 hour  Intake 2478.33 ml  Output    825 ml  Net 1653.33 ml   Filed Weights   07/03/14 1519 07/04/14 0500  Weight: 54.432 kg (120 lb) 54.4 kg (119 lb 14.9 oz)    Exam:   General:  Awake, in nad  Cardiovascular: regular, s1, s2  Respiratory: normal resp effort, no wheezing  Abdomen: soft,nondistended  Musculoskeletal: perfused, no clubbing   Data Reviewed: Basic Metabolic Panel:  Recent Labs Lab 07/03/14 1542 07/03/14 2003 07/04/14 0542  NA 139 138 138  K 2.7* 3.9 4.4  CL 95* 98 101  CO2 24 22 22   GLUCOSE 80 90 101*  BUN 32* 32* 32*  CREATININE 1.22* 1.19* 1.28*  CALCIUM 8.7 8.1* 8.5   MG 1.1*  --   --    Liver Function Tests:  Recent Labs Lab 07/03/14 1542 07/04/14 0542  AST 73* 71*  ALT 8 8  ALKPHOS 90 91  BILITOT 3.0* 5.3*  PROT 7.7 7.4  ALBUMIN 2.6* 2.5*    Recent Labs Lab 07/03/14 1542  LIPASE 52    Recent Labs Lab 07/03/14 1543  AMMONIA 34   CBC:  Recent Labs Lab 07/03/14 1542 07/03/14 2003 07/04/14 0542 07/04/14 1153  WBC 8.0  --  6.1  --   NEUTROABS 5.8  --   --   --   HGB 6.7* 6.1* 8.9* 8.3*  HCT 19.8* 18.3* 26.3* 25.0*  MCV 106.5*  --  98.1  --   PLT 102*  --  97*  --    Cardiac Enzymes:  Recent Labs Lab 07/03/14 1542  TROPONINI <0.30   BNP (last 3 results) No results for input(s): PROBNP in the last 8760 hours. CBG: No results for input(s): GLUCAP in the last 168 hours.  Recent Results (from the past 240 hour(s))  MRSA PCR Screening     Status: None   Collection Time: 07/03/14  6:05 PM  Result Value Ref Range Status   MRSA by PCR NEGATIVE NEGATIVE Final    Comment:        The GeneXpert MRSA Assay (FDA approved for NASAL specimens only), is  one component of a comprehensive MRSA colonization surveillance program. It is not intended to diagnose MRSA infection nor to guide or monitor treatment for MRSA infections.      Studies: Dg Chest Port 1 View  07/04/2014   CLINICAL DATA:  Endotracheal tube placement  EXAM: PORTABLE CHEST - 1 VIEW  COMPARISON:  04/25/2014  FINDINGS: Cardiomediastinal silhouette is stable. No acute infiltrate or pulmonary edema. Mild left basilar atelectasis. Endotracheal tube in place with tip 2.6 cm above the carina. No pneumothorax.  IMPRESSION: Endotracheal tube in place with tip 2.6 cm above the carina. No pneumothorax.   Electronically Signed   By: Lahoma Crocker M.D.   On: 07/04/2014 12:40    Scheduled Meds: . sodium chloride   Intravenous Once  . antiseptic oral rinse  7 mL Mouth Rinse QID  . cefTRIAXone (ROCEPHIN)  IV  2 g Intravenous Q24H  . chlorhexidine  15 mL Mouth Rinse BID  .  ethanolamine      . feeding supplement (ENSURE)  1 Container Oral TID BM  . midodrine  10 mg Oral BID WC  . propofol      . senna  1 tablet Oral BID  . spironolactone  50 mg Oral Daily  . torsemide  30 mg Oral Daily   Continuous Infusions: . sodium chloride 100 mL/hr at 07/04/14 1239  . octreotide  (SANDOSTATIN)    IV infusion 50 mcg/hr (07/04/14 1127)  . pantoprozole (PROTONIX) infusion 8 mg/hr (07/04/14 1051)  . propofol      Principal Problem:   Hematemesis Active Problems:   Sinus tachycardia   Cirrhosis   Anemia   Hypokalemia   Hypomagnesemia   Protein calorie malnutrition   Hypotension   Esophageal varices   Portal hypertensive gastropathy   Protein-calorie malnutrition, severe  Time spent: 66min  [facility], [facility] Pager [phone]. If 7PM-7AM, please contact night-coverage at [email or web address], password [facility] 07/04/2014, 2:09 PM  LOS: 1 day

## 2014-07-04 NOTE — Consult Note (Signed)
Consult requested by: [facility] Consult requested for ventilator management:  HPI: This is a 39 year old with a known history of cirrhosis of the liver who developed hematemesis with dark brown coffee ground material. She had 2 episodes and came to the emergency department. She has had previous paracentesis. She is getting ready to have endoscopy and has been intubated for airway protection. Consultation is requested for ventilator management. She does have a smoking history but does not have a diagnosis of COPD.  Past Medical History  Diagnosis Date  . GERD (gastroesophageal reflux disease)   . 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 c/w steatohepatitis  . Folate deficiency 09/2013  . Anasarca 10/10/2013  . Hematemesis/vomiting blood 02/24/2014  . Acute blood loss anemia 02/25/2014    Status post transfusion  . Macrocytosis 02/28/2014  . 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 Acute renal failure 09/2013    Pre-renal- resolved  . C. difficile colitis 04/19/2014     Family History  Problem Relation Age of Onset  . Heart disease Mother   . Colon cancer Neg Hx   . Liver disease Neg Hx      History   Social History  . Marital Status: Single    Spouse Name: N/A    Number of Children: 1  . Years of Education: N/A   Occupational History  . unemployed    Social History Main Topics  . Smoking status: Former Smoker -- 0.25 packs/day for 20 years    Types: Cigarettes    Quit date: 11/05/2008  . Smokeless tobacco: Never Used  . Alcohol Use: No  . Drug Use: No  . Sexual Activity:    Partners: Male    Birth Control/ Protection: None   Other Topics Concern  . None   Social History Narrative     ROS: Not obtainable    Objective: Vital signs in last 24 hours: Temp:  [97.2 F (36.2 C)-98.5 F (36.9 C)] 97.9 F (36.6 C) (11/25 1130) Pulse Rate:  [61-124] 66 (11/25 1320) Resp:  [10-22] 15 (11/25 1320) BP:  (79-128)/(37-91) 80/45 mmHg (11/25 1315) SpO2:  [90 %-100 %] 100 % (11/25 1320) FiO2 (%):  [50 %] 50 % (11/25 1300) Weight:  [54.4 kg (119 lb 14.9 oz)-54.432 kg (120 lb)] 54.4 kg (119 lb 14.9 oz) (11/25 0500) Weight change:  Last BM Date: 07/03/14  Intake/Output from previous day: 11/24 0701 - 11/25 0700 In: 2053.3 [I.V.:1133.3; Blood:670; IV Piggyback:250] Out: 600 [Urine:600]  PHYSICAL EXAM She is intubated sedated and on the ventilator. Pupils do react. Nose and throat are clear. Neck is supple. Chest shows some rhonchi. Her heart is regular without gallop. Her abdomen is somewhat protuberant. Extremities showed no edema sent from nervous system examination not really able to be evaluated.  Lab Results: Basic Metabolic Panel:  Recent Labs  07/03/14 1542 07/03/14 2003 07/04/14 0542  NA 139 138 138  K 2.7* 3.9 4.4  CL 95* 98 101  CO2 24 22 22   GLUCOSE 80 90 101*  BUN 32* 32* 32*  CREATININE 1.22* 1.19* 1.28*  CALCIUM 8.7 8.1* 8.5  MG 1.1*  --   --    Liver Function Tests:  Recent Labs  07/03/14 1542 07/04/14 0542  AST 73* 71*  ALT 8 8  ALKPHOS 90 91  BILITOT 3.0* 5.3*  PROT 7.7 7.4  ALBUMIN 2.6* 2.5*    Recent Labs  07/03/14 1542  LIPASE  52    Recent Labs  07/03/14 1543  AMMONIA 34   CBC:  Recent Labs  07/03/14 1542  07/04/14 0542 07/04/14 1153  WBC 8.0  --  6.1  --   NEUTROABS 5.8  --   --   --   HGB 6.7*  < > 8.9* 8.3*  HCT 19.8*  < > 26.3* 25.0*  MCV 106.5*  --  98.1  --   PLT 102*  --  97*  --   < > = values in this interval not displayed. Cardiac Enzymes:  Recent Labs  07/03/14 1542  TROPONINI <0.30   BNP: No results for input(s): PROBNP in the last 72 hours. D-Dimer: No results for input(s): DDIMER in the last 72 hours. CBG: No results for input(s): GLUCAP in the last 72 hours. Hemoglobin A1C: No results for input(s): HGBA1C in the last 72 hours. Fasting Lipid Panel: No results for input(s): CHOL, HDL, LDLCALC, TRIG,  CHOLHDL, LDLDIRECT in the last 72 hours. Thyroid Function Tests: No results for input(s): TSH, T4TOTAL, FREET4, T3FREE, THYROIDAB in the last 72 hours. Anemia Panel: No results for input(s): VITAMINB12, FOLATE, FERRITIN, TIBC, IRON, RETICCTPCT in the last 72 hours. Coagulation:  Recent Labs  07/03/14 1542 07/04/14 0542  LABPROT 16.7* 16.5*  INR 1.34 1.32   Urine Drug Screen: Drugs of Abuse     Component Value Date/Time   LABOPIA NONE DETECTED 05/14/2014 1338   COCAINSCRNUR NONE DETECTED 05/14/2014 1338   LABBENZ NONE DETECTED 05/14/2014 1338   AMPHETMU NONE DETECTED 05/14/2014 1338   THCU NONE DETECTED 05/14/2014 1338   LABBARB NONE DETECTED 05/14/2014 1338    Alcohol Level: No results for input(s): ETH in the last 72 hours. Urinalysis: No results for input(s): COLORURINE, LABSPEC, PHURINE, GLUCOSEU, HGBUR, BILIRUBINUR, KETONESUR, PROTEINUR, UROBILINOGEN, NITRITE, LEUKOCYTESUR in the last 72 hours.  Invalid input(s): APPERANCEUR Misc. Labs:   ABGS: No results for input(s): PHART, PO2ART, TCO2, HCO3 in the last 72 hours.  Invalid input(s): PCO2   MICROBIOLOGY: Recent Results (from the past 240 hour(s))  MRSA PCR Screening     Status: None   Collection Time: 07/03/14  6:05 PM  Result Value Ref Range Status   MRSA by PCR NEGATIVE NEGATIVE Final    Comment:        The GeneXpert MRSA Assay (FDA approved for NASAL specimens only), is one component of a comprehensive MRSA colonization surveillance program. It is not intended to diagnose MRSA infection nor to guide or monitor treatment for MRSA infections.     Studies/Results: Dg Chest Port 1 View  07/04/2014   CLINICAL DATA:  Endotracheal tube placement  EXAM: PORTABLE CHEST - 1 VIEW  COMPARISON:  04/25/2014  FINDINGS: Cardiomediastinal silhouette is stable. No acute infiltrate or pulmonary edema. Mild left basilar atelectasis. Endotracheal tube in place with tip 2.6 cm above the carina. No pneumothorax.   IMPRESSION: Endotracheal tube in place with tip 2.6 cm above the carina. No pneumothorax.   Electronically Signed   By: Lahoma Crocker M.D.   On: 07/04/2014 12:40    Medications:  Prior to Admission:  Prescriptions prior to admission  Medication Sig Dispense Refill Last Dose  . loperamide (IMODIUM) 2 MG capsule Take 1 capsule (2 mg total) by mouth 2 (two) times daily as needed for diarrhea or loose stools. 30 capsule 0 Past Month at Unknown time  . loratadine (CLARITIN) 10 MG tablet Take 1 tablet (10 mg total) by mouth daily. (Patient taking differently: Take 10 mg by  mouth daily as needed for allergies. )   more than a month  . magnesium oxide (MAG-OX) 400 (241.3 MG) MG tablet Take 1 tablet (400 mg total) by mouth daily. 30 tablet 3 Past Month at Unknown time  . midodrine (PROAMATINE) 10 MG tablet Take 1 tablet (10 mg total) by mouth 2 (two) times daily with a meal. Medication to keep your blood pressure from falling. 60 tablet 3 07/03/2014 at Unknown time  . oxyCODONE 10 MG TABS Take 1 tablet (10 mg total) by mouth every 6 (six) hours as needed for moderate pain. 30 tablet 0 Past Month at Unknown time  . pantoprazole (PROTONIX) 40 MG tablet Restart this medication 1 tablet 2 times daily after you have completed taking the course of Vancomycin. For management of GI bleeding. (Patient taking differently: Take 20 mg by mouth 2 (two) times daily. )   07/02/2014 at Unknown time  . spironolactone (ALDACTONE) 50 MG tablet Start this 50 mg tablet 1 time daily after you finish your 25 mg tablets. (Patient taking differently: Take 50 mg by mouth daily. ) 30 tablet 3 07/02/2014 at Unknown time  . torsemide (DEMADEX) 10 MG tablet Take 3 tablets (30 mg total) by mouth daily. 90 tablet 3 07/03/2014 at Unknown time  . metroNIDAZOLE (FLAGYL) 500 MG tablet Take 1 tablet (500 mg total) by mouth every 8 (eight) hours. (Patient not taking: Reported on 06/22/2014) 24 tablet 0 Taking  . potassium chloride (MICRO-K) 10 MEQ  CR capsule Take 10 mEq by mouth 3 (three) times daily.   Taking   Scheduled: . sodium chloride   Intravenous Once  . antiseptic oral rinse  7 mL Mouth Rinse QID  . chlorhexidine  15 mL Mouth Rinse BID  . ethanolamine      . feeding supplement (ENSURE)  1 Container Oral TID BM  . midodrine  10 mg Oral BID WC  . propofol      . senna  1 tablet Oral BID  . spironolactone  50 mg Oral Daily  . torsemide  30 mg Oral Daily   Continuous: . sodium chloride 100 mL/hr at 07/04/14 1239  . octreotide  (SANDOSTATIN)    IV infusion 50 mcg/hr (07/04/14 1127)  . pantoprozole (PROTONIX) infusion 8 mg/hr (07/04/14 1051)  . propofol     [id number], ondansetron **OR** ondansetron (ZOFRAN) IV, oxyCODONE, polyethylene glycol  Assesment: She has hematemesis. She has cirrhosis of the liver and presumably has bleeding from esophageal varices and she is getting ready to undergo emergency endoscopy. She has protein calorie malnutrition. She is intubated for airway protection and has a smoking history but does not have a diagnosis of COPD. Principal Problem:   Hematemesis Active Problems:   Sinus tachycardia   Cirrhosis   Anemia   Hypokalemia   Hypomagnesemia   Protein calorie malnutrition   Hypotension    Plan: Ventilator orders have been written. I will continue to follow.  Thanks for allowing me to see her with you    LOS: 1 day   Dyesha Henault L 07/04/2014, 1:22 PM

## 2014-07-04 NOTE — Progress Notes (Signed)
Extubated complaining of some stomach pain and throat pain. Did give a few ice chips to moisten mouth.. Received order to give morphine for pain control which seems to be working patient is not showing outward signs of pain and minimal level of pain a 2 on 0-10 scale.

## 2014-07-04 NOTE — Op Note (Signed)
[facility] [address] [place], [address]   ENDOSCOPY PROCEDURE REPORT  PATIENT: Wanda [name], Wanda [name]  MR#: [id number] BIRTHDATE: 1976-05-18 , 38  yrs. old GENDER: female ENDOSCOPIST: R.  Garfield Cornea, MD FACP [facility] REFERRED BY: PROCEDURE DATE:  07/11/2014 PROCEDURE:  EGD w/ band ligation of varices INDICATIONS:  hematemesis; known cirrhotic; prior history of variceal hemorrhage requiring banding earlier this year. MEDICATIONS: patient intubated; deeply sedated on propofol drip ASA CLASS:      Class III  CONSENT: The risks, benefits, limitations, alternatives and imponderables have been discussed.  The potential for biopsy, esophogeal dilation, etc. have also been reviewed.  Questions have been answered.  All parties agreeable.  Please see the history and physical in the medical record for more information.  DESCRIPTION OF PROCEDURE: After the risks benefits and alternatives of the procedure were thoroughly explained, informed consent was obtained.  The    endoscope was introduced through the mouth and advanced to the second portion of the duodenum , limited by Without limitations. The instrument was slowly withdrawn as the mucosa was fully examined.    Patient with grade 2 esophageal varices?"3 columns; (1) column of grade 1 varices.  One of the grade 2 columns had a cherry red mark just above the GE junction.  There was no esophagitis and no evidence of a Mallory-Weiss tear.  Stomach had a small amount of clot in the fundus but no fresh blood or bleeding lesions seen in the stomach.  The gastric mucosa was significantly congested and friable.  Diffuse mucosal changes of portal gastropathy.  Small hiatal hernia.  No ulcer or infiltrating process seen.  No gastric varices.  Patent pylorus.  Normal first and second portion of the duodenum  Scope was withdrawn and a 7 shot [employer] banding apparatus was loaded up.  Scope was reintroduced into the  esophagus and a single band was placed on each of the 3 columns of grade 2 varices. I placed a band directly on the cherry red mark.  Good hemostasis maintained throughout this procedure.  Retroflexed views revealed no abnormalities.     The scope was then withdrawn from the patient and the procedure completed.  COMPLICATIONS: There were no immediate complications.  ENDOSCOPIC IMPRESSION: Persisting grade 2 esophageal varices with bleeding stigmata?"status post band ligation. Significantly congested gastric mucosa with changes consistent with portal gastropathy  RECOMMENDATIONS: Hopefully, move towards extubation in the next 24 hours. Continue Sandostatin infusion for another 48 hours. Continue antibiotics for another 5 days. We'll need to begin a nonselective beta blocker while she is in the hospital with a target heart rate of 55.   REPEAT EXAM:  eSigned:  R. Garfield Cornea, MD Rosalita Chessman [facility] Jul 11, 2014 2:23 PM    CC:  CPT CODES: ICD CODES:  The ICD and CPT codes recommended by this software are interpretations from the data that the clinical staff has captured with the software.  The verification of the translation of this report to the ICD and CPT codes and modifiers is the sole responsibility of the health care institution and practicing physician where this report was generated.  [employer] will not be held responsible for the validity of the ICD and CPT codes included on this report.  AMA assumes no liability for data contained or not contained herein. CPT is a Designer, television/film set of the [employer].  PATIENT NAME:  Wanda [name], Wanda [name] MR#: [id number]

## 2014-07-04 NOTE — Progress Notes (Signed)
INITIAL NUTRITION ASSESSMENT  DOCUMENTATION CODES Per approved criteria  -Severe malnutrition in the context of chronic illness  Pt meets criteria for severe MALNUTRITION in the context of chronic illness as evidenced by weith loss of 25% past 7 months and energy intake </=75% for >/= 1 month  INTERVENTION: Ensure Complete po BID, each supplement provides 350 kcal and 13 grams of protein   NUTRITION DIAGNOSIS: Inadequate oral intake related to altered GI function as evidenced by poor po intake PTA, 12% wt loss x 2 months, 25% in 7 months and mild-moderate muscle depletion;ongoing.        Goal: Pt to meet >/= 90% of their estimated nutrition needs     Monitor: percent meal and supplement intake, labs and weight changes   Reason for Assessment: Malnutrition Screen and consult for assessment of nutrition status  38 y.o. female  Admitting Dx: Hematemesis  ASSESSMENT: pt has hx of cirrhosis, ascites (paracentesis earlier this month), esophogeal varices. She  Presents to ED with hematemesis, AMS. She received 2 units PRBC's and GI has planned to do an EGD today (1330) and she is to be intubated for airway protection around lunch.  Pt able  to provide limited hx. Per chart review: Pt was in to see Dr Oneida Alar earlier this month due to nausea, episodes of vomiting and loose stools. She has a hx of moderate malnutrition which was identified in September of this year.  She has worsening malnutrition with poor tolerance of oral intake and additional weight loss of 12% over past 2 months and 25% in past 7 months and pt weighed as much as 200# in February.   Abnormal Labs: BUN, Creat.and glucose  Height: Ht Readings from Last 1 Encounters:  07/03/14 5\' 2"  (1.575 m)    Weight: Wt Readings from Last 1 Encounters:  07/04/14 119 lb 14.9 oz (54.4 kg)    Ideal Body Weight: 110#  % Ideal Body Weight: 109%  Wt Readings from Last 10 Encounters:  07/04/14 119 lb 14.9 oz (54.4 kg)  06/13/14 114  lb (51.71 kg)  05/18/14 117 lb 4.6 oz (53.2 kg)  04/25/14 136 lb 0.4 oz (61.7 kg)  04/17/14 135 lb (61.236 kg)  04/12/14 134 lb (60.782 kg)  03/01/14 149 lb 14.6 oz (68 kg)  12/19/13 145 lb (65.772 kg)  12/05/13 134 lb 9.6 oz (61.054 kg)  11/23/13 160 lb (72.576 kg)    Usual Body Weight: 150-160#   % Usual Body Weight: 75%  BMI:  Body mass index is 21.93 kg/(m^2). normal range  Estimated Nutritional Needs: Kcal: 1620-1890 Protein: at least 81 gr  Fluid: 1.6-1.9 liters daily  Skin: intact  Diet Order: Diet NPO time specified  EDUCATION NEEDS: -Education not appropriate at this time   Intake/Output Summary (Last 24 hours) at 07/04/14 1003 Last data filed at 07/04/14 0900  Gross per 24 hour  Intake 2378.33 ml  Output    825 ml  Net 1553.33 ml    Last BM: 11/24 abdomen distended  Labs:   Recent Labs Lab 07/03/14 1542 07/03/14 2003 07/04/14 0542  NA 139 138 138  K 2.7* 3.9 4.4  CL 95* 98 101  CO2 24 22 22   BUN 32* 32* 32*  CREATININE 1.22* 1.19* 1.28*  CALCIUM 8.7 8.1* 8.5  MG 1.1*  --   --   GLUCOSE 80 90 101*    CBG (last 3)  No results for input(s): GLUCAP in the last 72 hours.  Scheduled Meds: . sodium chloride  Intravenous STAT  . sodium chloride   Intravenous Once  . feeding supplement (ENSURE)  1 Container Oral TID BM  . midodrine  10 mg Oral BID WC  . senna  1 tablet Oral BID  . spironolactone  50 mg Oral Daily  . torsemide  30 mg Oral Daily    Continuous Infusions: . sodium chloride 10 mL/hr at 07/03/14 2045  . octreotide  (SANDOSTATIN)    IV infusion 50 mcg/hr (07/04/14 0900)  . pantoprozole (PROTONIX) infusion 8 mg/hr (07/04/14 0900)    Past Medical History  Diagnosis Date  . GERD (gastroesophageal reflux disease)   . 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 c/w steatohepatitis  . Folate deficiency 09/2013  . Anasarca 10/10/2013  . Hematemesis/vomiting blood 02/24/2014  . Acute blood loss  anemia 02/25/2014    Status post transfusion  . Macrocytosis 02/28/2014  . 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 Acute renal failure 09/2013    Pre-renal- resolved  . C. difficile colitis 04/19/2014    Past Surgical History  Procedure Laterality Date  . None    . Paracentesis  Feb 2015    1180 fluid, negative fluid analysis.   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 Paracentesis  10/2013  . Colonoscopy N/A 12/19/2013    SLF:NO OBVIOUS SOURCE FOR ANEMIA IDETIFIED/ONE COLON POLYP REMOVED/Small internal hemorrhoids  . Esophagogastroduodenoscopy N/A 02/11/2014    Dr. Rourk:Esophageal varices with bleeding stigmata-status post esophageal band ligation therapy. Portal gastropathy    Colman Cater MS,RD,CSG,LDN Office: [phone] Pager: [phone]

## 2014-07-04 NOTE — Care Management Note (Addendum)
    Page 1 of 1   07/06/2014     12:54:30 PM CARE MANAGEMENT NOTE 07/06/2014  Patient:  Wanda [name], Wanda [name]   Account Number:  [id number]  Date Initiated:  07/04/2014  Documentation initiated by:  Jolene Provost  Subjective/Objective Assessment:   Pt is from home, lives with bf and is independent with ADL's. Pt has no HH services, DME's or med needs prior to admission.     Action/Plan:   Pt plans to discharge home. No CM needs identified at this time.   Anticipated DC Date:  07/07/2014   Anticipated DC Plan:  [place]  CM consult      Choice offered to / List presented to:             Status of service:  Completed, signed off Medicare Important Message given?  YES (If response is "NO", the following Medicare IM given date fields will be blank) Date Medicare IM given:  07/06/2014 Medicare IM given by:  Jolene Provost Date Additional Medicare IM given:   Additional Medicare IM given by:    Discharge Disposition:  HOME/SELF CARE  Per UR Regulation:    If discussed at Long Length of Stay Meetings, dates discussed:    Comments:  07/06/2014 [place], RN, MSN, PCCN Pt plans to discharge home with self care tomorrow. No CM needs identified at this time.  07/04/2014 0900 Jolene Provost, RN, MSN, [facility]

## 2014-07-04 NOTE — H&P (View-Only) (Signed)
Patient briefly seen this morning. Hgb improved after 2 units PRBCs. No further evidence of overt GI bleeding. Needs elective intubation for airway protection prior to EGD today. Called Melanie in endoscopy suite, who states the case will follow the OR case from this morning. Notified of plan. Contacted Kim, scheduling with anesthesia at [phone] regarding need for elective intubation. This was discussed with Maudie Mercury and Inez Catalina (charge nurse); Inez Catalina to inform CRNA of timing and need for elective intubation. I also notified Dr. Wyline Copas, with the hospitalist service, of elective intubation prior to EGD. Finally, Purcell Nails, ICU RN, notified of plan and expected time frame.   Patient is aware of plan and understands risks and benefits.   Orvil Feil, ANP-BC [facility]    Attending note Spoke with Mikki Santee, CRNA. Plan for elective intubation around noon. Procedure around 1:30 today.

## 2014-07-04 NOTE — Plan of Care (Signed)
Problem: Phase I Progression Outcomes Goal: Pain controlled with appropriate interventions Outcome: Completed/Met Date Met:  07/04/14 Goal: OOB as tolerated unless otherwise ordered Outcome: Progressing Goal: Initial discharge plan identified Outcome: Progressing Goal: Hemodynamically stable Outcome: Progressing  Problem: Phase II Progression Outcomes Goal: No active bleeding Outcome: Progressing Goal: Hemodynamically stable Outcome: Completed/Met Date Met:  07/04/14 Goal: Progress activity as tolerated unless otherwise ordered Outcome: Progressing

## 2014-07-04 NOTE — Interval H&P Note (Signed)
History and Physical Interval Note:  07/04/2014 1:20 PM  Wanda [name]  has presented today for surgery, with the diagnosis of Hematemesis/vomiting blood/dark stools  The various methods of treatment have been discussed with the patient and family. After consideration of risks, benefits and other options for treatment, the patient has consented to  Procedure(s) with comments: ESOPHAGOGASTRODUODENOSCOPY (EGD) (N/A) - 945 as a surgical intervention .  The patient's history has been reviewed, patient examined, no change in status, stable for surgery.  I have reviewed the patient's chart and labs.  Questions were answered to the patient's satisfaction.     [name]  No change .bedside EGD with therapuetic intervention. Pt now intubated.  The risks, benefits, limitations, alternatives and imponderables have been reviewed with the patient. Potential for esophageal dilation, biopsy, etc. have also been reviewed.  Questions have been answered. All parties agreeable.

## 2014-07-04 NOTE — Procedures (Signed)
Extubation Procedure Note RT performed weaning parameters FVC 1..35L and an ABG was performed,all results called to physician and RT received orders to extubate. At 1724hr patient was extubated to 2L O2 via nasal cannula, patient SATs 98%, HR 80 and BP 109/70. No complications noted, RN at bedside Patient Details:   Name: Wanda [name] DOB: 04/20/1976 MRN: [id number]   Airway Documentation:  AIRWAYS (Active)  Secured at (cm) 22 cm 07/04/2014 12:00 AM     Airway 7.5 mm (Active)  Secured at (cm) 22 cm 07/04/2014  3:38 PM  Measured From Lips 07/04/2014  3:38 PM  Secured Location Left 07/04/2014  3:38 PM  Secured By [employer] 07/04/2014  3:38 PM  Tube Holder Repositioned Yes 07/04/2014  3:38 PM  Site Condition Dry 07/04/2014  3:38 PM    Evaluation  O2 sats: stable throughout Complications: No apparent complications Patient did tolerate procedure well. Bilateral Breath Sounds: Diminished   Yes  Lana Fish 07/04/2014, 5:25 PM

## 2014-07-04 NOTE — Progress Notes (Addendum)
Patient in ICU Room9, EGD/Banding at Bedside.  Diprovan infusion per ICU RN Loni Muse, RN. No additional sedation needed.

## 2014-07-05 ENCOUNTER — Inpatient Hospital Stay (HOSPITAL_COMMUNITY): Payer: Medicare Other

## 2014-07-05 DIAGNOSIS — K920 Hematemesis: Secondary | ICD-10-CM | POA: Insufficient documentation

## 2014-07-05 DIAGNOSIS — K3189 Other diseases of stomach and duodenum: Secondary | ICD-10-CM

## 2014-07-05 LAB — COMPREHENSIVE METABOLIC PANEL
ALK PHOS: 86 U/L (ref 39–117)
ALT: 8 U/L (ref 0–35)
AST: 74 U/L — ABNORMAL HIGH (ref 0–37)
Albumin: 2.3 g/dL — ABNORMAL LOW (ref 3.5–5.2)
Anion gap: 15 (ref 5–15)
BUN: 27 mg/dL — ABNORMAL HIGH (ref 6–23)
CO2: 20 mEq/L (ref 19–32)
Calcium: 8.5 mg/dL (ref 8.4–10.5)
Chloride: 104 mEq/L (ref 96–112)
Creatinine, Ser: 1.34 mg/dL — ABNORMAL HIGH (ref 0.50–1.10)
GFR calc non Af Amer: 49 mL/min — ABNORMAL LOW (ref >90)
GFR, EST AFRICAN AMERICAN: 57 mL/min — AB (ref >90)
Glucose, Bld: 109 mg/dL — ABNORMAL HIGH (ref 70–99)
POTASSIUM: 3.9 meq/L (ref 3.7–5.3)
SODIUM: 139 meq/L (ref 137–147)
Total Bilirubin: 2.1 mg/dL — ABNORMAL HIGH (ref 0.3–1.2)
Total Protein: 7.2 g/dL (ref 6.0–8.3)

## 2014-07-05 LAB — CBC
HCT: 25.6 % — ABNORMAL LOW (ref 36.0–46.0)
HEMOGLOBIN: 8.9 g/dL — AB (ref 12.0–15.0)
MCH: 34.1 pg — ABNORMAL HIGH (ref 26.0–34.0)
MCHC: 34.8 g/dL (ref 30.0–36.0)
MCV: 98.1 fL (ref 78.0–100.0)
Platelets: 86 10*3/uL — ABNORMAL LOW (ref 150–400)
RBC: 2.61 MIL/uL — ABNORMAL LOW (ref 3.87–5.11)
RDW: 24.8 % — ABNORMAL HIGH (ref 11.5–15.5)
WBC: 5.4 10*3/uL (ref 4.0–10.5)

## 2014-07-05 MED ORDER — SUCRALFATE 1 GM/10ML PO SUSP
1.0000 g | Freq: Three times a day (TID) | ORAL | Status: DC
  Administered 2014-07-05 – 2014-07-07 (×10): 1 g via ORAL
  Filled 2014-07-05 (×10): qty 10

## 2014-07-05 MED ORDER — PROPRANOLOL HCL 20 MG PO TABS
20.0000 mg | ORAL_TABLET | Freq: Two times a day (BID) | ORAL | Status: DC
  Administered 2014-07-05 (×2): 20 mg via ORAL
  Filled 2014-07-05 (×5): qty 1

## 2014-07-05 MED ORDER — OCTREOTIDE ACETATE 500 MCG/ML IJ SOLN
INTRAMUSCULAR | Status: AC
  Filled 2014-07-05: qty 1

## 2014-07-05 NOTE — Progress Notes (Signed)
[facility] PROGRESS NOTE  Wanda [name] SWN:[id number] DOB: May 20, 1976 DOA: 07/03/2014 PCP: Default, Provider, MD  Assessment/Plan: 1. Acute blood loss anemia secondary to upper GI bleed 1. Hgb improved with prbc transfusion 2. Cont to follow hgb and transfuse as needed 3. GI followed 4. EGD on 11/25 with findings of grade 2 varices 5. GI recs for 48hrs more of Sandostatin, 5 more days of abx 6. Cont on qday PPI with carafate x 2 week 7. Now on propranalol with goal HR of 55 2. Cirrhosis 1. Cont on lasix with spironolactone 2. Empiric rocephin was started in ED empirically secondary to soft BP 3. GI recs for 5 more days of empiric abx 4. Will check ammonia in AM as pt seems slightly more confused this AM 3. Hypotension 1. Suspect secondary to cirrhosis with acute blood loss anemia 4. Tachycardia 1. Suspect secondary to acute blood loss anemia with cirrhosis 2. Currently normal heart rate 5. Hypokalemia 1. Normalized 6. Unintentional weight loss 1. Nutrition consulted 7. DVT prophylaxis 1. SCD's  Code Status: Full Family Communication: Pt in room Disposition Plan: Pending  Consultants:  GI  Critical Care  Procedures:  Pending EGD  Antibiotics:  Rocephin 11/24>>>  HPI/Subjective: No major events noted overnight. Reports feeling slightly more confused today  Objective: Filed Vitals:   07/05/14 0700 07/05/14 0820 07/05/14 1147 07/05/14 1149  BP: 97/62   110/55  Pulse:    90  Temp:  97.5 F (36.4 C) 97.1 F (36.2 C)   TempSrc:  Oral Oral   Resp: 14     Height:      Weight:      SpO2:        Intake/Output Summary (Last 24 hours) at 07/05/14 1447 Last data filed at 07/05/14 0600  Gross per 24 hour  Intake 2996.08 ml  Output    400 ml  Net 2596.08 ml   Filed Weights   07/03/14 1519 07/04/14 0500 07/05/14 0500  Weight: 54.432 kg (120 lb) 54.4 kg (119 lb 14.9 oz) 57.1 kg (125 lb 14.1 oz)    Exam:   General:  Awake, in  nad  Cardiovascular: regular, s1, s2  Respiratory: normal resp effort, no wheezing  Abdomen: soft,nondistended  Musculoskeletal: perfused, no clubbing   Data Reviewed: Basic Metabolic Panel:  Recent Labs Lab 07/03/14 1542 07/03/14 2003 07/04/14 0542 07/05/14 0548  NA 139 138 138 139  K 2.7* 3.9 4.4 3.9  CL 95* 98 101 104  CO2 24 22 22 20   GLUCOSE 80 90 101* 109*  BUN 32* 32* 32* 27*  CREATININE 1.22* 1.19* 1.28* 1.34*  CALCIUM 8.7 8.1* 8.5 8.5  MG 1.1*  --   --   --    Liver Function Tests:  Recent Labs Lab 07/03/14 1542 07/04/14 0542 07/05/14 0548  AST 73* 71* 74*  ALT 8 8 8   ALKPHOS 90 91 86  BILITOT 3.0* 5.3* 2.1*  PROT 7.7 7.4 7.2  ALBUMIN 2.6* 2.5* 2.3*    Recent Labs Lab 07/03/14 1542  LIPASE 52    Recent Labs Lab 07/03/14 1543  AMMONIA 34   CBC:  Recent Labs Lab 07/03/14 1542 07/03/14 2003 07/04/14 0542 07/04/14 1153 07/04/14 1959 07/05/14 0548  WBC 8.0  --  6.1  --   --  5.4  NEUTROABS 5.8  --   --   --   --   --   HGB 6.7* 6.1* 8.9* 8.3* 8.9* 8.9*  HCT 19.8* 18.3* 26.3* 25.0* 26.4* 25.6*  MCV 106.5*  --  98.1  --   --  98.1  PLT 102*  --  97*  --   --  86*   Cardiac Enzymes:  Recent Labs Lab 07/03/14 1542  TROPONINI <0.30   BNP (last 3 results) No results for input(s): PROBNP in the last 8760 hours. CBG: No results for input(s): GLUCAP in the last 168 hours.  Recent Results (from the past 240 hour(s))  MRSA PCR Screening     Status: None   Collection Time: 07/03/14  6:05 PM  Result Value Ref Range Status   MRSA by PCR NEGATIVE NEGATIVE Final    Comment:        The GeneXpert MRSA Assay (FDA approved for NASAL specimens only), is one component of a comprehensive MRSA colonization surveillance program. It is not intended to diagnose MRSA infection nor to guide or monitor treatment for MRSA infections.      Studies: Dg Chest Port 1 View  07/04/2014   CLINICAL DATA:  Endotracheal tube placement  EXAM:  PORTABLE CHEST - 1 VIEW  COMPARISON:  04/25/2014  FINDINGS: Cardiomediastinal silhouette is stable. No acute infiltrate or pulmonary edema. Mild left basilar atelectasis. Endotracheal tube in place with tip 2.6 cm above the carina. No pneumothorax.  IMPRESSION: Endotracheal tube in place with tip 2.6 cm above the carina. No pneumothorax.   Electronically Signed   By: Lahoma Crocker M.D.   On: 07/04/2014 12:40    Scheduled Meds: . sodium chloride   Intravenous Once  . antiseptic oral rinse  7 mL Mouth Rinse QID  . cefTRIAXone (ROCEPHIN)  IV  2 g Intravenous Q24H  . chlorhexidine  15 mL Mouth Rinse BID  . feeding supplement (ENSURE)  1 Container Oral TID BM  . midodrine  10 mg Oral BID WC  . propranolol  20 mg Oral BID  . senna  1 tablet Oral BID  . spironolactone  50 mg Oral Daily  . sucralfate  1 g Oral TID WC & HS  . torsemide  30 mg Oral Daily   Continuous Infusions: . sodium chloride 100 mL/hr at 07/05/14 0910  . octreotide  (SANDOSTATIN)    IV infusion 50 mcg/hr (07/05/14 0909)  . pantoprozole (PROTONIX) infusion 8 mg/hr (07/05/14 0700)  . propofol Stopped (07/04/14 1630)    Principal Problem:   Hematemesis Active Problems:   Sinus tachycardia   Cirrhosis   Anemia   Hypokalemia   Hypomagnesemia   Protein calorie malnutrition   Hypotension   Esophageal varices   Portal hypertensive gastropathy   Protein-calorie malnutrition, severe  Time spent: 60min  Skyler Dusing, [facility] Pager [phone]. If 7PM-7AM, please contact night-coverage at [email or web address], password [facility] 07/05/2014, 2:47 PM  LOS: 2 days

## 2014-07-05 NOTE — Progress Notes (Signed)
Patient arrive to unit alert and oriented. IV infusing no c/o pain voiced.

## 2014-07-05 NOTE — Progress Notes (Signed)
Patient has remained free of distress throughout the night.  Has complained of pain to right upper quad following along transverse toward the right side. This has been managed with pain medication ordered.  Otherwise has slept on and off through the night.

## 2014-07-05 NOTE — Progress Notes (Signed)
Subjective: She had EGD and banding of varices yesterday. She was not felt to need any other procedures and did not have any active bleeding. She had been intubated for airway protection and since it was not felt that she was going to require another procedure she was extubated yesterday and has done well  Objective: Vital signs in last 24 hours: Temp:  [97 F (36.1 C)-98.2 F (36.8 C)] 97.5 F (36.4 C) (11/26 0820) Pulse Rate:  [66-100] 80 (11/26 0600) Resp:  [10-19] 14 (11/26 0700) BP: (79-118)/(41-89) 97/62 mmHg (11/26 0700) SpO2:  [92 %-100 %] 100 % (11/26 0600) FiO2 (%):  [40 %-50 %] 40 % (11/25 1611) Weight:  [57.1 kg (125 lb 14.1 oz)] 57.1 kg (125 lb 14.1 oz) (11/26 0500) Weight change: 2.668 kg (5 lb 14.1 oz) Last BM Date: 07/03/14  Intake/Output from previous day: 11/25 0701 - 11/26 0700 In: 3421.1 [I.V.:3371.1; IV Piggyback:50] Out: 625 [Urine:625]  PHYSICAL EXAM General appearance: alert, cooperative and no distress Resp: clear to auscultation bilaterally Cardio: regular rate and rhythm, S1, S2 normal, no murmur, click, rub or gallop GI: soft, non-tender; bowel sounds normal; no masses,  no organomegaly Extremities: extremities normal, atraumatic, no cyanosis or edema  Lab Results:  Results for orders placed or performed during the hospital encounter of 07/03/14 (from the past 48 hour(s))  Type and screen     Status: None (Preliminary result)   Collection Time: 07/03/14  3:30 PM  Result Value Ref Range   ABO/RH(D) A POS    Antibody Screen NEG    Sample Expiration 07/06/2014    Unit Number [id number]    Blood Component Type RED CELLS,LR    Unit division 00    Status of Unit ISSUED,FINAL    Transfusion Status OK TO TRANSFUSE    Crossmatch Result Compatible    Unit Number [id number]    Blood Component Type RED CELLS,LR    Unit division 00    Status of Unit ISSUED,FINAL    Transfusion Status OK TO TRANSFUSE    Crossmatch Result Compatible    Unit Number  [id number]    Blood Component Type RED CELLS,LR    Unit division 00    Status of Unit ALLOCATED    Transfusion Status OK TO TRANSFUSE    Crossmatch Result Compatible    Unit Number [id number]    Blood Component Type RED CELLS,LR    Unit division 00    Status of Unit ISS'D ANOTH PT    Transfusion Status OK TO TRANSFUSE    Crossmatch Result Compatible    Unit Number [id number]    Blood Component Type RED CELLS,LR    Unit division 00    Status of Unit ISS'D ANOTH PT    Transfusion Status OK TO TRANSFUSE    Crossmatch Result Compatible    Unit Number [id number]    Blood Component Type RED CELLS,LR    Unit division 00    Status of Unit ALLOCATED    Transfusion Status OK TO TRANSFUSE    Crossmatch Result Compatible   CBC WITH DIFFERENTIAL     Status: Abnormal   Collection Time: 07/03/14  3:42 PM  Result Value Ref Range   WBC 8.0 4.0 - 10.5 K/uL   RBC 1.86 (L) 3.87 - 5.11 MIL/uL   Hemoglobin 6.7 (LL) 12.0 - 15.0 g/dL    Comment: RESULT REPEATED AND VERIFIED CRITICAL RESULT CALLED TO, READ BACK BY AND VERIFIED WITH: CRUISE,JJ ON 07/03/2014 AT 1615 BY ISLEY,B  HCT 19.8 (L) 36.0 - 46.0 %   MCV 106.5 (H) 78.0 - 100.0 fL   MCH 36.0 (H) 26.0 - 34.0 pg   MCHC 33.8 30.0 - 36.0 g/dL   RDW 17.3 (H) 11.5 - 15.5 %   Platelets 102 (L) 150 - 400 K/uL   Neutrophils Relative % 73 43 - 77 %   Neutro Abs 5.8 1.7 - 7.7 K/uL   Lymphocytes Relative 20 12 - 46 %   Lymphs Abs 1.6 0.7 - 4.0 K/uL   Monocytes Relative 6 3 - 12 %   Monocytes Absolute 0.5 0.1 - 1.0 K/uL   Eosinophils Relative 1 0 - 5 %   Eosinophils Absolute 0.1 0.0 - 0.7 K/uL   Basophils Relative 1 0 - 1 %   Basophils Absolute 0.1 0.0 - 0.1 K/uL   Smear Review SPECIMEN CHECKED FOR CLOTS     Comment: PLATELETS APPEAR DECREASED PLATELET COUNT CONFIRMED BY SMEAR   Comprehensive metabolic panel     Status: Abnormal   Collection Time: 07/03/14  3:42 PM  Result Value Ref Range   Sodium 139 137 - 147 mEq/L   Potassium  2.7 (LL) 3.7 - 5.3 mEq/L    Comment: CRITICAL RESULT CALLED TO, READ BACK BY AND VERIFIED WITH: HONEYCUTT,S ON 07/03/14 AT 1645 BY LOY,C    Chloride 95 (L) 96 - 112 mEq/L   CO2 24 19 - 32 mEq/L   Glucose, Bld 80 70 - 99 mg/dL   BUN 32 (H) 6 - 23 mg/dL   Creatinine, Ser 1.22 (H) 0.50 - 1.10 mg/dL   Calcium 8.7 8.4 - 10.5 mg/dL   Total Protein 7.7 6.0 - 8.3 g/dL   Albumin 2.6 (L) 3.5 - 5.2 g/dL   AST 73 (H) 0 - 37 U/L   ALT 8 0 - 35 U/L   Alkaline Phosphatase 90 39 - 117 U/L   Total Bilirubin 3.0 (H) 0.3 - 1.2 mg/dL   GFR calc non Af Amer 55 (L) >90 mL/min   GFR calc Af Amer 63 (L) >90 mL/min    Comment: (NOTE) The eGFR has been calculated using the CKD EPI equation. This calculation has not been validated in all clinical situations. eGFR's persistently <90 mL/min signify possible Chronic Kidney Disease.    Anion gap 20 (H) 5 - 15  Lipase, blood     Status: None   Collection Time: 07/03/14  3:42 PM  Result Value Ref Range   Lipase 52 11 - 59 U/L  Protime-INR     Status: Abnormal   Collection Time: 07/03/14  3:42 PM  Result Value Ref Range   Prothrombin Time 16.7 (H) 11.6 - 15.2 seconds   INR 1.34 0.00 - 1.49  APTT     Status: None   Collection Time: 07/03/14  3:42 PM  Result Value Ref Range   aPTT 36 24 - 37 seconds  Troponin I     Status: None   Collection Time: 07/03/14  3:42 PM  Result Value Ref Range   Troponin I <0.30 <0.30 ng/mL    Comment:        Due to the release kinetics of cTnI, a negative result within the first hours of the onset of symptoms does not rule out myocardial infarction with certainty. If myocardial infarction is still suspected, repeat the test at appropriate intervals.   Prepare RBC     Status: None   Collection Time: 07/03/14  3:42 PM  Result Value Ref Range  Order Confirmation ORDER PROCESSED BY BLOOD BANK   Magnesium     Status: Abnormal   Collection Time: 07/03/14  3:42 PM  Result Value Ref Range   Magnesium 1.1 (L) 1.5 - 2.5 mg/dL   Ammonia     Status: None   Collection Time: 07/03/14  3:43 PM  Result Value Ref Range   Ammonia 34 11 - 60 umol/L  POC occult blood, ED     Status: Abnormal   Collection Time: 07/03/14  4:06 PM  Result Value Ref Range   Fecal Occult Bld POSITIVE (A) NEGATIVE  Prepare RBC     Status: None   Collection Time: 07/03/14  6:00 PM  Result Value Ref Range   Order Confirmation ORDER PROCESSED BY BLOOD BANK   MRSA PCR Screening     Status: None   Collection Time: 07/03/14  6:05 PM  Result Value Ref Range   MRSA by PCR NEGATIVE NEGATIVE    Comment:        The GeneXpert MRSA Assay (FDA approved for NASAL specimens only), is one component of a comprehensive MRSA colonization surveillance program. It is not intended to diagnose MRSA infection nor to guide or monitor treatment for MRSA infections.   Hemoglobin and hematocrit, blood     Status: Abnormal   Collection Time: 07/03/14  8:03 PM  Result Value Ref Range   Hemoglobin 6.1 (LL) 12.0 - 15.0 g/dL    Comment: DELTA CHECK NOTED CRITICAL RESULT CALLED TO, READ BACK BY AND VERIFIED WITH: KEITH A AT 2023 ON [id number] BY FORSYTH K    HCT 18.3 (L) 36.0 - 95.2 %  Basic metabolic panel     Status: Abnormal   Collection Time: 07/03/14  8:03 PM  Result Value Ref Range   Sodium 138 137 - 147 mEq/L   Potassium 3.9 3.7 - 5.3 mEq/L    Comment: DELTA CHECK NOTED   Chloride 98 96 - 112 mEq/L   CO2 22 19 - 32 mEq/L   Glucose, Bld 90 70 - 99 mg/dL   BUN 32 (H) 6 - 23 mg/dL   Creatinine, Ser 1.19 (H) 0.50 - 1.10 mg/dL   Calcium 8.1 (L) 8.4 - 10.5 mg/dL   GFR calc non Af Amer 56 (L) >90 mL/min   GFR calc Af Amer 65 (L) >90 mL/min    Comment: (NOTE) The eGFR has been calculated using the CKD EPI equation. This calculation has not been validated in all clinical situations. eGFR's persistently <90 mL/min signify possible Chronic Kidney Disease.    Anion gap 18 (H) 5 - 15  CBC     Status: Abnormal   Collection Time: 07/04/14  5:42 AM  Result  Value Ref Range   WBC 6.1 4.0 - 10.5 K/uL   RBC 2.68 (L) 3.87 - 5.11 MIL/uL   Hemoglobin 8.9 (L) 12.0 - 15.0 g/dL    Comment: DELTA CHECK NOTED   HCT 26.3 (L) 36.0 - 46.0 %   MCV 98.1 78.0 - 100.0 fL    Comment: DELTA CHECK NOTED   MCH 33.2 26.0 - 34.0 pg   MCHC 33.8 30.0 - 36.0 g/dL   RDW 23.1 (H) 11.5 - 15.5 %   Platelets 97 (L) 150 - 400 K/uL    Comment: SPECIMEN CHECKED FOR CLOTS CORRECTED ON 11/25 AT 1120: PREVIOUSLY REPORTED AS 97   Protime-INR     Status: Abnormal   Collection Time: 07/04/14  5:42 AM  Result Value Ref Range   Prothrombin Time  16.5 (H) 11.6 - 15.2 seconds   INR 1.32 0.00 - 1.49  Comprehensive metabolic panel     Status: Abnormal   Collection Time: 07/04/14  5:42 AM  Result Value Ref Range   Sodium 138 137 - 147 mEq/L   Potassium 4.4 3.7 - 5.3 mEq/L   Chloride 101 96 - 112 mEq/L   CO2 22 19 - 32 mEq/L   Glucose, Bld 101 (H) 70 - 99 mg/dL   BUN 32 (H) 6 - 23 mg/dL   Creatinine, Ser 1.28 (H) 0.50 - 1.10 mg/dL   Calcium 8.5 8.4 - 10.5 mg/dL   Total Protein 7.4 6.0 - 8.3 g/dL   Albumin 2.5 (L) 3.5 - 5.2 g/dL   AST 71 (H) 0 - 37 U/L   ALT 8 0 - 35 U/L   Alkaline Phosphatase 91 39 - 117 U/L   Total Bilirubin 5.3 (H) 0.3 - 1.2 mg/dL   GFR calc non Af Amer 52 (L) >90 mL/min   GFR calc Af Amer 60 (L) >90 mL/min    Comment: (NOTE) The eGFR has been calculated using the CKD EPI equation. This calculation has not been validated in all clinical situations. eGFR's persistently <90 mL/min signify possible Chronic Kidney Disease.    Anion gap 15 5 - 15  Hemoglobin and hematocrit, blood     Status: Abnormal   Collection Time: 07/04/14 11:53 AM  Result Value Ref Range   Hemoglobin 8.3 (L) 12.0 - 15.0 g/dL   HCT 25.0 (L) 36.0 - 46.0 %  Draw ABG 1 hour after initiation of ventilator     Status: Abnormal   Collection Time: 07/04/14  2:00 PM  Result Value Ref Range   FIO2 50.00 %   Delivery systems VENTILATOR    Mode PRESSURE REGULATED VOLUME CONTROL    VT 400  mL   Rate 15 resp/min   Peep/cpap 5.0 cm H20   pH, Arterial 7.437 7.350 - 7.450   pCO2 arterial 30.7 (L) 35.0 - 45.0 mmHg   pO2, Arterial 199.0 (H) 80.0 - 100.0 mmHg   Bicarbonate 20.3 20.0 - 24.0 mEq/L   TCO2 18.7 0 - 100 mmol/L   Acid-base deficit 3.2 (H) 0.0 - 2.0 mmol/L   O2 Saturation 99.4 %   Patient temperature 37.0    Collection site LEFT BRACHIAL    Drawn by [id number]    Sample type ARTERIAL    Allens test (pass/fail) PASS PASS  Blood gas, arterial     Status: Abnormal   Collection Time: 07/04/14  4:40 PM  Result Value Ref Range   FIO2 40.00 %   Delivery systems VENTILATOR    Mode CONTINUOUS POSITIVE AIRWAY PRESSURE    Peep/cpap 5.0 cm H20   Pressure support 5 cm H20   pH, Arterial 7.392 7.350 - 7.450   pCO2 arterial 33.6 (L) 35.0 - 45.0 mmHg   pO2, Arterial 129.0 (H) 80.0 - 100.0 mmHg   Bicarbonate 20.0 20.0 - 24.0 mEq/L   TCO2 18.7 0 - 100 mmol/L   Acid-base deficit 4.1 (H) 0.0 - 2.0 mmol/L   O2 Saturation 98.6 %   Patient temperature 37.0    Collection site LEFT BRACHIAL    Drawn by [id number]    Sample type ARTERIAL   Hemoglobin and hematocrit, blood     Status: Abnormal   Collection Time: 07/04/14  7:59 PM  Result Value Ref Range   Hemoglobin 8.9 (L) 12.0 - 15.0 g/dL   HCT 26.4 (L) 36.0 -  46.0 %  CBC     Status: Abnormal   Collection Time: 07/05/14  5:48 AM  Result Value Ref Range   WBC 5.4 4.0 - 10.5 K/uL   RBC 2.61 (L) 3.87 - 5.11 MIL/uL   Hemoglobin 8.9 (L) 12.0 - 15.0 g/dL   HCT 25.6 (L) 36.0 - 46.0 %   MCV 98.1 78.0 - 100.0 fL   MCH 34.1 (H) 26.0 - 34.0 pg   MCHC 34.8 30.0 - 36.0 g/dL   RDW 24.8 (H) 11.5 - 15.5 %   Platelets 86 (L) 150 - 400 K/uL    Comment: SPECIMEN CHECKED FOR CLOTS LARGE PLATELETS PRESENT PLATELET COUNT CONFIRMED BY SMEAR   Comprehensive metabolic panel     Status: Abnormal   Collection Time: 07/05/14  5:48 AM  Result Value Ref Range   Sodium 139 137 - 147 mEq/L   Potassium 3.9 3.7 - 5.3 mEq/L   Chloride 104 96 - 112 mEq/L    CO2 20 19 - 32 mEq/L   Glucose, Bld 109 (H) 70 - 99 mg/dL   BUN 27 (H) 6 - 23 mg/dL   Creatinine, Ser 1.34 (H) 0.50 - 1.10 mg/dL   Calcium 8.5 8.4 - 10.5 mg/dL   Total Protein 7.2 6.0 - 8.3 g/dL   Albumin 2.3 (L) 3.5 - 5.2 g/dL   AST 74 (H) 0 - 37 U/L   ALT 8 0 - 35 U/L   Alkaline Phosphatase 86 39 - 117 U/L   Total Bilirubin 2.1 (H) 0.3 - 1.2 mg/dL    Comment: DELTA CHECK NOTED   GFR calc non Af Amer 49 (L) >90 mL/min   GFR calc Af Amer 57 (L) >90 mL/min    Comment: (NOTE) The eGFR has been calculated using the CKD EPI equation. This calculation has not been validated in all clinical situations. eGFR's persistently <90 mL/min signify possible Chronic Kidney Disease.    Anion gap 15 5 - 15    ABGS  Recent Labs  07/04/14 1640  PHART 7.392  PO2ART 129.0*  TCO2 18.7  HCO3 20.0   CULTURES Recent Results (from the past 240 hour(s))  MRSA PCR Screening     Status: None   Collection Time: 07/03/14  6:05 PM  Result Value Ref Range Status   MRSA by PCR NEGATIVE NEGATIVE Final    Comment:        The GeneXpert MRSA Assay (FDA approved for NASAL specimens only), is one component of a comprehensive MRSA colonization surveillance program. It is not intended to diagnose MRSA infection nor to guide or monitor treatment for MRSA infections.    Studies/Results: Dg Chest Port 1 View  07/04/2014   CLINICAL DATA:  Endotracheal tube placement  EXAM: PORTABLE CHEST - 1 VIEW  COMPARISON:  04/25/2014  FINDINGS: Cardiomediastinal silhouette is stable. No acute infiltrate or pulmonary edema. Mild left basilar atelectasis. Endotracheal tube in place with tip 2.6 cm above the carina. No pneumothorax.  IMPRESSION: Endotracheal tube in place with tip 2.6 cm above the carina. No pneumothorax.   Electronically Signed   By: Lahoma Crocker M.D.   On: 07/04/2014 12:40    Medications:  Prior to Admission:  Prescriptions prior to admission  Medication Sig Dispense Refill Last Dose  . loperamide  (IMODIUM) 2 MG capsule Take 1 capsule (2 mg total) by mouth 2 (two) times daily as needed for diarrhea or loose stools. 30 capsule 0 Past Month at Unknown time  . loratadine (CLARITIN) 10 MG tablet  Take 1 tablet (10 mg total) by mouth daily. (Patient taking differently: Take 10 mg by mouth daily as needed for allergies. )   more than a month  . magnesium oxide (MAG-OX) 400 (241.3 MG) MG tablet Take 1 tablet (400 mg total) by mouth daily. 30 tablet 3 Past Month at Unknown time  . midodrine (PROAMATINE) 10 MG tablet Take 1 tablet (10 mg total) by mouth 2 (two) times daily with a meal. Medication to keep your blood pressure from falling. 60 tablet 3 07/03/2014 at Unknown time  . oxyCODONE 10 MG TABS Take 1 tablet (10 mg total) by mouth every 6 (six) hours as needed for moderate pain. 30 tablet 0 Past Month at Unknown time  . pantoprazole (PROTONIX) 40 MG tablet Restart this medication 1 tablet 2 times daily after you have completed taking the course of Vancomycin. For management of GI bleeding. (Patient taking differently: Take 20 mg by mouth 2 (two) times daily. )   07/02/2014 at Unknown time  . spironolactone (ALDACTONE) 50 MG tablet Start this 50 mg tablet 1 time daily after you finish your 25 mg tablets. (Patient taking differently: Take 50 mg by mouth daily. ) 30 tablet 3 07/02/2014 at Unknown time  . torsemide (DEMADEX) 10 MG tablet Take 3 tablets (30 mg total) by mouth daily. 90 tablet 3 07/03/2014 at Unknown time  . metroNIDAZOLE (FLAGYL) 500 MG tablet Take 1 tablet (500 mg total) by mouth every 8 (eight) hours. (Patient not taking: Reported on 06/22/2014) 24 tablet 0 Taking  . potassium chloride (MICRO-K) 10 MEQ CR capsule Take 10 mEq by mouth 3 (three) times daily.   Taking   Scheduled: . sodium chloride   Intravenous Once  . antiseptic oral rinse  7 mL Mouth Rinse QID  . cefTRIAXone (ROCEPHIN)  IV  2 g Intravenous Q24H  . chlorhexidine  15 mL Mouth Rinse BID  . feeding supplement (ENSURE)  1  Container Oral TID BM  . midodrine  10 mg Oral BID WC  . senna  1 tablet Oral BID  . spironolactone  50 mg Oral Daily  . torsemide  30 mg Oral Daily   Continuous: . sodium chloride 100 mL/hr at 07/05/14 0910  . octreotide  (SANDOSTATIN)    IV infusion 50 mcg/hr (07/05/14 0909)  . pantoprozole (PROTONIX) infusion 8 mg/hr (07/05/14 0700)  . propofol Stopped (07/04/14 1630)   ZOX:[id number], morphine injection, ondansetron **OR** ondansetron (ZOFRAN) IV, oxyCODONE, polyethylene glycol  Assesment: She was admitted with hematemesis and apparently had bleeding esophageal varices. She is known to have cirrhosis of the liver and has had previous episode of variceal bleeding. She has protein calorie malnutrition. She was intubated for airway protection but the tube is out now she is off the ventilator has no respiratory problems at this point. Principal Problem:   Hematemesis Active Problems:   Sinus tachycardia   Cirrhosis   Anemia   Hypokalemia   Hypomagnesemia   Protein calorie malnutrition   Hypotension   Esophageal varices   Portal hypertensive gastropathy   Protein-calorie malnutrition, severe    Plan: I will plan to sign off. Thanks for allowing me to see her with you. I will be glad to be more active again if needed.    LOS: 2 days   Seline Enzor L 07/05/2014, 9:15 AM

## 2014-07-05 NOTE — Progress Notes (Signed)
Patient report called to nurse on 300. Patient is being transported by wheelchair and all medications taken with her.

## 2014-07-05 NOTE — Progress Notes (Signed)
Patient extubated. Has done well since procedure yesterday. Tolerating a soft but does have some pain with swallowing (not surprising).  No further bleeding. Hemoglobin remained stable at 8.9   Vital signs in last 24 hours: Temp:  [97 F (36.1 C)-98.2 F (36.8 C)] 97.5 F (36.4 C) (11/26 0820) Pulse Rate:  [66-89] 80 (11/26 0600) Resp:  [10-19] 14 (11/26 0700) BP: (79-108)/(41-71) 97/62 mmHg (11/26 0700) SpO2:  [92 %-100 %] 100 % (11/26 0600) FiO2 (%):  [40 %-50 %] 40 % (11/25 1611) Weight:  [125 lb 14.1 oz (57.1 kg)] 125 lb 14.1 oz (57.1 kg) (11/26 0500) Last BM Date: 07/03/14 General:   Alert,  Well-developed, well-nourished, pleasant and cooperative in NAD Abdomen:  Mildly distended.  Normal bowel sounds, without guarding, and without rebound.  No mass or organomegaly. Extremities:  Without clubbing or edema.    Intake/Output from previous day: 11/25 0701 - 11/26 0700 In: 3421.1 [I.V.:3371.1; IV Piggyback:50] Out: 625 [Urine:625] Intake/Output this shift:    Lab Results:  Recent Labs  07/03/14 1542  07/04/14 0542 07/04/14 1153 07/04/14 1959 07/05/14 0548  WBC 8.0  --  6.1  --   --  5.4  HGB 6.7*  < > 8.9* 8.3* 8.9* 8.9*  HCT 19.8*  < > 26.3* 25.0* 26.4* 25.6*  PLT 102*  --  97*  --   --  86*  < > = values in this interval not displayed. BMET  Recent Labs  07/03/14 2003 07/04/14 0542 07/05/14 0548  NA 138 138 139  K 3.9 4.4 3.9  CL 98 101 104  CO2 22 22 20   GLUCOSE 90 101* 109*  BUN 32* 32* 27*  CREATININE 1.19* 1.28* 1.34*  CALCIUM 8.1* 8.5 8.5   LFT  Recent Labs  07/05/14 0548  PROT 7.2  ALBUMIN 2.3*  AST 74*  ALT 8  ALKPHOS 60  BILITOT 2.1*     Impression:   38 year old with advanced chronic liver disease complicated by portal hypertension and recurrent variceal hemorrhage requiring a second banding session. At least for now, varices felt to have been fairly well obliterated with yesterday's session. Bleeding appears to have ceased. Mild  bump in creatinine noted. Blood pressure little on the soft side but she needs to be on a non-selective beta blocker  Recommendations:  48 more hours of Sandostatin IV then discontinue. 5 more days of antibiotics prophylactically may transition to oral agent today.  Agree with continuing PPI-once daily therapy should suffice. We'll add Carafate suspension 1 g 4 times a day 2 weeks.  Will start propranolol-target heart rate will be 55.  Continue to follow creatinine and blood pressure. Outpatient follow-up with Dr. Oneida Alar. Will initiate liver transplant referral if patient continues to be interested.

## 2014-07-06 DIAGNOSIS — E43 Unspecified severe protein-calorie malnutrition: Secondary | ICD-10-CM

## 2014-07-06 LAB — CBC
HCT: 25.1 % — ABNORMAL LOW (ref 36.0–46.0)
HEMOGLOBIN: 8.4 g/dL — AB (ref 12.0–15.0)
MCH: 33.2 pg (ref 26.0–34.0)
MCHC: 33.5 g/dL (ref 30.0–36.0)
MCV: 99.2 fL (ref 78.0–100.0)
Platelets: 103 10*3/uL — ABNORMAL LOW (ref 150–400)
RBC: 2.53 MIL/uL — ABNORMAL LOW (ref 3.87–5.11)
RDW: 24.7 % — ABNORMAL HIGH (ref 11.5–15.5)
WBC: 6.8 10*3/uL (ref 4.0–10.5)

## 2014-07-06 LAB — COMPREHENSIVE METABOLIC PANEL
ALT: 7 U/L (ref 0–35)
ANION GAP: 13 (ref 5–15)
AST: 56 U/L — ABNORMAL HIGH (ref 0–37)
Albumin: 2.4 g/dL — ABNORMAL LOW (ref 3.5–5.2)
Alkaline Phosphatase: 86 U/L (ref 39–117)
BUN: 22 mg/dL (ref 6–23)
CO2: 19 mEq/L (ref 19–32)
CREATININE: 1.53 mg/dL — AB (ref 0.50–1.10)
Calcium: 8.4 mg/dL (ref 8.4–10.5)
Chloride: 106 mEq/L (ref 96–112)
GFR calc Af Amer: 48 mL/min — ABNORMAL LOW (ref >90)
GFR calc non Af Amer: 42 mL/min — ABNORMAL LOW (ref >90)
Glucose, Bld: 113 mg/dL — ABNORMAL HIGH (ref 70–99)
Potassium: 3.5 mEq/L — ABNORMAL LOW (ref 3.7–5.3)
SODIUM: 138 meq/L (ref 137–147)
TOTAL PROTEIN: 7 g/dL (ref 6.0–8.3)
Total Bilirubin: 1.9 mg/dL — ABNORMAL HIGH (ref 0.3–1.2)

## 2014-07-06 LAB — AMMONIA: AMMONIA: 39 umol/L (ref 11–60)

## 2014-07-06 MED ORDER — POTASSIUM CHLORIDE CRYS ER 20 MEQ PO TBCR
40.0000 meq | EXTENDED_RELEASE_TABLET | Freq: Once | ORAL | Status: AC
  Administered 2014-07-06: 40 meq via ORAL
  Filled 2014-07-06: qty 2

## 2014-07-06 MED ORDER — PANTOPRAZOLE SODIUM 40 MG PO TBEC
40.0000 mg | DELAYED_RELEASE_TABLET | Freq: Every day | ORAL | Status: DC
  Administered 2014-07-06 – 2014-07-07 (×2): 40 mg via ORAL
  Filled 2014-07-06 (×2): qty 1

## 2014-07-06 NOTE — Progress Notes (Signed)
Pt c/o IVF causing hands and feet to swell. Notified Dr Rogue Bussing. Dr said he will decrease slightly, but d/t low BP, he will not d/c it at this time. Will continue to monitor pt frequently throughout night. Bed is in lowest position and call bell is within reach.

## 2014-07-06 NOTE — Progress Notes (Signed)
Dr informed of low  systolic b/p.[name]

## 2014-07-06 NOTE — Progress Notes (Signed)
Patient's discomfort with swallowing has improved. No further bleeding. Tolerating a soft diet.  Vital signs in last 24 hours: Temp:  [97.2 F (36.2 C)-97.9 F (36.6 C)] 97.8 F (36.6 C) (11/27 0551) Pulse Rate:  [63] 63 (11/27 0551) Resp:  [12-20] 20 (11/27 0551) BP: (84-111)/(51-75) 92/70 mmHg (11/27 0952) SpO2:  [100 %] 100 % (11/27 0551) Last BM Date: 07/05/14 General:   Awake alert pleasant. Abdomen:  Soft, nontender and nondistended.  Normal bowel sounds, without guarding, and without rebound.  No mass or organomegaly. Extremities:  Without clubbing or edema.    Intake/Output from previous day: 11/26 0701 - 11/27 0700 In: 610 [P.O.:560; IV Piggyback:50] Out: 350 [Urine:350] Intake/Output this shift: Total I/O In: 240 [P.O.:240] Out: -   Lab Results:  Recent Labs  07/04/14 0542  07/04/14 1959 07/05/14 0548 07/06/14 0624  WBC 6.1  --   --  5.4 6.8  HGB 8.9*  < > 8.9* 8.9* 8.4*  HCT 26.3*  < > 26.4* 25.6* 25.1*  PLT 97*  --   --  86* 103*  < > = values in this interval not displayed. BMET  Recent Labs  07/04/14 0542 07/05/14 0548 07/06/14 0624  NA 138 139 138  K 4.4 3.9 3.5*  CL 101 104 106  CO2 22 20 19   GLUCOSE 101* 109* 113*  BUN 32* 27* 22  CREATININE 1.28* 1.34* 1.53*  CALCIUM 8.5 8.5 8.4   LFT  Recent Labs  07/06/14 0624  PROT 7.0  ALBUMIN 2.4*  AST 56*  ALT 7  ALKPHOS 23  BILITOT 1.9*     Impression:  38 year old lady with cirrhosis and portal hypertension complicated by recurrent esophageal variceal hemorrhage status post esophageal band ligation recently. Bleeding has ceased. Hemoglobin stable. On Inderal 20 mg twice daily. Heart rate may not be at target yet. Blood pressures a little soft.  Recommendations:  Stop Sandostatin infusion tomorrow morning.  Continue Inderal 20 mg twice daily so long as vitals are reasonable and no symptoms. Further titration may be needed as an outpatient. Carafate 1 week.  Continue Protonix 40 mg  daily.  Transition antibiotics to oral Levaquin 5 days. Arrange follow-up with Dr. Oneida Alar in the next few weeks. If she continues to remain stable, she could probably go home tomorrow from a GI standpoint.

## 2014-07-06 NOTE — Progress Notes (Signed)
[facility] PROGRESS NOTE  Wanda [name] VXB:[id number] DOB: 02-Oct-1975 DOA: 07/03/2014 PCP: Default, Provider, MD  Assessment/Plan: 1. Acute blood loss anemia secondary to upper GI bleed 1. Hgb improved and stable following prbc transfusion 2. GI following 3. EGD on 11/25 with findings of grade 2 varices 4. GI recs for 24hrs more of Sandostatin, 5 more days of abx 5. Cont on qday PPI with carafate x 2 week 6. Cont on propranalol with goal HR of 55 2. Cirrhosis 1. Cont on lasix with spironolactone 2. Empiric rocephin was started in ED empirically secondary to soft BP 3. GI recs for 5 more days of empiric abx 4. Repeat ammonia level normal 3. Hypotension 1. Suspect secondary to cirrhosis with acute blood loss anemia 4. Tachycardia 1. Suspect secondary to acute blood loss anemia with cirrhosis 2. Rate controlled 5. Hypokalemia 1. Normalized 6. Unintentional weight loss 1. Nutrition consulted 7. DVT prophylaxis 1. SCD's  Code Status: Full Family Communication: Pt in room, significant other at bedside Disposition Plan: Pending  Consultants:  GI  Critical Care  Procedures:  Pending EGD  Antibiotics:  Rocephin 11/24>>>  HPI/Subjective: Feels well. No complaints.  Objective: Filed Vitals:   07/05/14 1628 07/05/14 2227 07/06/14 0551 07/06/14 0952  BP:  85/51 88/75 92/70   Pulse:  63 63   Temp: 97.2 F (36.2 C) 97.9 F (36.6 C) 97.8 F (36.6 C)   TempSrc: Oral Oral Oral   Resp:  20 20   Height:      Weight:      SpO2:  100% 100%     Intake/Output Summary (Last 24 hours) at 07/06/14 1359 Last data filed at 07/06/14 0930  Gross per 24 hour  Intake    560 ml  Output    350 ml  Net    210 ml   Filed Weights   07/03/14 1519 07/04/14 0500 07/05/14 0500  Weight: 54.432 kg (120 lb) 54.4 kg (119 lb 14.9 oz) 57.1 kg (125 lb 14.1 oz)    Exam:   General:  Awake, in nad  Cardiovascular: regular, s1, s2  Respiratory: normal resp effort, no  wheezing  Abdomen: soft,nondistended  Musculoskeletal: perfused, no clubbing   Data Reviewed: Basic Metabolic Panel:  Recent Labs Lab 07/03/14 1542 07/03/14 2003 07/04/14 0542 07/05/14 0548 07/06/14 0624  NA 139 138 138 139 138  K 2.7* 3.9 4.4 3.9 3.5*  CL 95* 98 101 104 106  CO2 24 22 22 20 19   GLUCOSE 80 90 101* 109* 113*  BUN 32* 32* 32* 27* 22  CREATININE 1.22* 1.19* 1.28* 1.34* 1.53*  CALCIUM 8.7 8.1* 8.5 8.5 8.4  MG 1.1*  --   --   --   --    Liver Function Tests:  Recent Labs Lab 07/03/14 1542 07/04/14 0542 07/05/14 0548 07/06/14 0624  AST 73* 71* 74* 56*  ALT 8 8 8 7   ALKPHOS 90 91 86 86  BILITOT 3.0* 5.3* 2.1* 1.9*  PROT 7.7 7.4 7.2 7.0  ALBUMIN 2.6* 2.5* 2.3* 2.4*    Recent Labs Lab 07/03/14 1542  LIPASE 52    Recent Labs Lab 07/03/14 1543 07/06/14 0624  AMMONIA 34 39   CBC:  Recent Labs Lab 07/03/14 1542  07/04/14 0542 07/04/14 1153 07/04/14 1959 07/05/14 0548 07/06/14 0624  WBC 8.0  --  6.1  --   --  5.4 6.8  NEUTROABS 5.8  --   --   --   --   --   --  HGB 6.7*  < > 8.9* 8.3* 8.9* 8.9* 8.4*  HCT 19.8*  < > 26.3* 25.0* 26.4* 25.6* 25.1*  MCV 106.5*  --  98.1  --   --  98.1 99.2  PLT 102*  --  97*  --   --  86* 103*  < > = values in this interval not displayed. Cardiac Enzymes:  Recent Labs Lab 07/03/14 1542  TROPONINI <0.30   BNP (last 3 results) No results for input(s): PROBNP in the last 8760 hours. CBG: No results for input(s): GLUCAP in the last 168 hours.  Recent Results (from the past 240 hour(s))  MRSA PCR Screening     Status: None   Collection Time: 07/03/14  6:05 PM  Result Value Ref Range Status   MRSA by PCR NEGATIVE NEGATIVE Final    Comment:        The GeneXpert MRSA Assay (FDA approved for NASAL specimens only), is one component of a comprehensive MRSA colonization surveillance program. It is not intended to diagnose MRSA infection nor to guide or monitor treatment for MRSA infections.       Studies: No results found.  Scheduled Meds: . antiseptic oral rinse  7 mL Mouth Rinse QID  . cefTRIAXone (ROCEPHIN)  IV  2 g Intravenous Q24H  . chlorhexidine  15 mL Mouth Rinse BID  . feeding supplement (ENSURE)  1 Container Oral TID BM  . midodrine  10 mg Oral BID WC  . pantoprazole  40 mg Oral Daily  . propranolol  20 mg Oral BID  . senna  1 tablet Oral BID  . spironolactone  50 mg Oral Daily  . sucralfate  1 g Oral TID WC & HS  . torsemide  30 mg Oral Daily   Continuous Infusions: . sodium chloride 100 mL/hr at 07/06/14 0913  . octreotide  (SANDOSTATIN)    IV infusion 50 mcg/hr (07/06/14 0912)    Principal Problem:   Hematemesis Active Problems:   Sinus tachycardia   Cirrhosis   Anemia   Hypokalemia   Hypomagnesemia   Protein calorie malnutrition   Hypotension   Esophageal varices   Portal hypertensive gastropathy   Protein-calorie malnutrition, severe   Hematemesis with nausea  Time spent: 38min  Tanielle Emigh, [facility] Pager [phone]. If 7PM-7AM, please contact night-coverage at [email or web address], password [facility] 07/06/2014, 1:59 PM  LOS: 3 days

## 2014-07-07 LAB — TYPE AND SCREEN
ABO/RH(D): A POS
Antibody Screen: NEGATIVE
UNIT DIVISION: 0
Unit division: 0
Unit division: 0
Unit division: 0
Unit division: 0
Unit division: 0

## 2014-07-07 LAB — CBC
HCT: 25.1 % — ABNORMAL LOW (ref 36.0–46.0)
Hemoglobin: 8.3 g/dL — ABNORMAL LOW (ref 12.0–15.0)
MCH: 33.2 pg (ref 26.0–34.0)
MCHC: 33.1 g/dL (ref 30.0–36.0)
MCV: 100.4 fL — ABNORMAL HIGH (ref 78.0–100.0)
PLATELETS: 100 10*3/uL — AB (ref 150–400)
RBC: 2.5 MIL/uL — ABNORMAL LOW (ref 3.87–5.11)
RDW: 24 % — AB (ref 11.5–15.5)
WBC: 6.8 10*3/uL (ref 4.0–10.5)

## 2014-07-07 MED ORDER — OXYCODONE HCL 10 MG PO TABS: 10.0000 mg | ORAL_TABLET | Freq: Four times a day (QID) | ORAL | Status: DC | PRN

## 2014-07-07 MED ORDER — LEVOFLOXACIN 500 MG PO TABS: 500.0000 mg | ORAL_TABLET | Freq: Every day | ORAL | Status: DC

## 2014-07-07 MED ORDER — PANTOPRAZOLE SODIUM 40 MG PO TBEC: 40.0000 mg | DELAYED_RELEASE_TABLET | Freq: Every day | ORAL | Status: DC

## 2014-07-07 MED ORDER — SUCRALFATE 1 GM/10ML PO SUSP: 1.0000 g | Freq: Three times a day (TID) | ORAL | Status: DC

## 2014-07-07 MED ORDER — FUROSEMIDE 10 MG/ML IJ SOLN: 40.0000 mg | Freq: Once | INTRAMUSCULAR | Status: DC

## 2014-07-07 MED ORDER — PROPRANOLOL HCL 20 MG PO TABS: 20.0000 mg | ORAL_TABLET | Freq: Two times a day (BID) | ORAL | Status: DC

## 2014-07-07 NOTE — Discharge Planning (Signed)
Will continue to titrate Octeotride per Nursing Communication order and pt should be able to be [facility] in couple hours.  However, pt's ride is working till 1900.  Pt's will remain till ride arrives - chrg notified.

## 2014-07-07 NOTE — Discharge Summary (Signed)
Physician Discharge Summary  Wanda [name] WOE:[id number] DOB: 03-11-1976 DOA: 07/03/2014  PCP: Default, Provider, MD  Admit date: 07/03/2014 Discharge date: 07/07/2014  Time spent: 30 minutes  Recommendations for Outpatient Follow-up:  1. Follow up with PCP in 1-2 weeks 2. Follow up with GI as scheduled  Discharge Diagnoses:  Principal Problem:   Hematemesis Active Problems:   Sinus tachycardia   Cirrhosis   Anemia   Hypokalemia   Hypomagnesemia   Protein calorie malnutrition   Hypotension   Esophageal varices   Portal hypertensive gastropathy   Protein-calorie malnutrition, severe   Hematemesis with nausea   Discharge Condition: Improved  Diet recommendation: Low sodium  Filed Weights   07/03/14 1519 07/04/14 0500 07/05/14 0500  Weight: 54.432 kg (120 lb) 54.4 kg (119 lb 14.9 oz) 57.1 kg (125 lb 14.1 oz)    History of present illness:  Please see admit h and p for details. Briefly, pt presents with hematemesis with coffee ground type emesis. Denied abd pain dysuria or sob. Pt was admitted for further work up.  Hospital Course:  1. Acute blood loss anemia secondary to upper GI bleed 1. Hgb improved and stable following prbc transfusion 2. GI had been following 3. EGD on 11/25 with findings of grade 2 varices 4. GI recs for Sandostatin through the day of discharge 5. GI recs for empiric to prevent SBP with 5 more days of levaquin on discharge 6. Cont on qday PPI with carafate x 2 week 7. Cont on propranalol with goal HR of 55 2. Cirrhosis 1. Cont on torsemide with spironolactone 2. Pt with increased LE edema. Recommended trial of IV lasix, however, pt strongly opposed this, stating "lasix doesn't work." Pt refused IV lasix. Pt to follow up with PCP closely as an outpatient. Lungs clear. Pt reports being ambulatory to bedside commode without issues. 3. Empiric rocephin was started in ED empirically secondary to soft BP 4. GI recs for 5 more days of empiric abx  on discharge 5. Repeat ammonia level normal 3. Hypotension 1. Suspect secondary to cirrhosis with acute blood loss anemia 4. Tachycardia 1. Suspect secondary to acute blood loss anemia with cirrhosis 2. Rate controlled 5. Hypokalemia 1. Normalized 6. Unintentional weight loss 1. Nutrition consulted 7. DVT prophylaxis 1. SCD's while inpatient  Procedures: EGD 11/26  Consultations:  GI  Discharge Exam: Filed Vitals:   07/06/14 1418 07/06/14 2201 07/07/14 0540 07/07/14 0915  BP: 78/51 76/65 81/43  92/61  Pulse: 61 56 54   Temp: 98.5 F (36.9 C) 97.7 F (36.5 C) 97.8 F (36.6 C)   TempSrc: Oral Oral Oral   Resp: 20 18 18    Height:      Weight:      SpO2: 99% 99% 100%     General: awake, in nad Cardiovascular: regular, s1, s2 Respiratory: normal resp effort, no wheezing  Discharge Instructions     Medication List    STOP taking these medications        metroNIDAZOLE 500 MG tablet  Commonly known as:  FLAGYL      TAKE these medications        levofloxacin 500 MG tablet  Commonly known as:  LEVAQUIN  Take 1 tablet (500 mg total) by mouth daily.     loperamide 2 MG capsule  Commonly known as:  IMODIUM  Take 1 capsule (2 mg total) by mouth 2 (two) times daily as needed for diarrhea or loose stools.     loratadine 10 MG tablet  Commonly known as:  CLARITIN  Take 1 tablet (10 mg total) by mouth daily.     magnesium oxide 400 (241.3 MG) MG tablet  Commonly known as:  MAG-OX  Take 1 tablet (400 mg total) by mouth daily.     midodrine 10 MG tablet  Commonly known as:  PROAMATINE  Take 1 tablet (10 mg total) by mouth 2 (two) times daily with a meal. Medication to keep your blood pressure from falling.     Oxycodone HCl 10 MG Tabs  Take 1 tablet (10 mg total) by mouth every 6 (six) hours as needed for moderate pain.     Oxycodone HCl 10 MG Tabs  Take 1 tablet (10 mg total) by mouth every 6 (six) hours as needed for moderate pain.     pantoprazole 40 MG  tablet  Commonly known as:  PROTONIX  Take 1 tablet (40 mg total) by mouth daily.     potassium chloride 10 MEQ CR capsule  Commonly known as:  MICRO-K  Take 10 mEq by mouth 3 (three) times daily.     propranolol 20 MG tablet  Commonly known as:  INDERAL  Take 1 tablet (20 mg total) by mouth 2 (two) times daily.     spironolactone 50 MG tablet  Commonly known as:  ALDACTONE  Start this 50 mg tablet 1 time daily after you finish your 25 mg tablets.     sucralfate 1 GM/10ML suspension  Commonly known as:  CARAFATE  Take 10 mLs (1 g total) by mouth 4 (four) times daily -  with meals and at bedtime.     torsemide 10 MG tablet  Commonly known as:  DEMADEX  Take 3 tablets (30 mg total) by mouth daily.       Allergies  Allergen Reactions  . Lasix [Furosemide]     "doesn't work"       Follow-up Information    Follow up with Default, Provider, MD.      Follow up with Follow up with your PCP in 1-2 weeks.      Follow up with Barney Drain, MD. Schedule an appointment as soon as possible for a visit in 2 weeks.   Specialty:  Gastroenterology   Contact information:   [place] [address] [place] [address] [phone]        The results of significant diagnostics from this hospitalization (including imaging, microbiology, ancillary and laboratory) are listed below for reference.    Significant Diagnostic Studies: US Abdomen Limited  06/15/2014   CLINICAL DATA:  Hepatic cirrhosis  EXAM: US ABDOMEN LIMITED - RIGHT UPPER QUADRANT  COMPARISON:  None.  FINDINGS: Gallbladder:  There is sludge within the gallbladder. No gallstones are seen. Gallbladder wall is mildly thickened. There is no pericholecystic fluid. No sonographic Murphy sign noted.  Common bile duct:  Diameter: 8 mm, borderline prominent. No mass or calculus is appreciated.  Liver:  No focal lesion identified. Liver is enlarged measuring approximately 19 cm in length. Liver echogenicity is  increased. The liver has a nodular contour consistent with underlying cirrhosis.  There is moderate ascites.  IMPRESSION: Ascites.  Liver has an appearance consistent with underlying cirrhosis. While no focal liver lesions are identified, it must be cautioned that the sensitivity of ultrasound for focal liver lesions is significantly diminished in this circumstance.  There is sludge in the gallbladder. Gallbladder wall is mildly thickened. Thickening of gallbladder wall may be secondary to the ascites which  is present. This finding also potentially could represent a degree of acalculus cholecystitis. If there is concern clinically for cholecystitis, nuclear medicine hepatobiliary imaging study could be helpful to assess for cystic duct patency.  Common bile duct mildly prominent without mass or calculus seen by ultrasound.   Electronically Signed   By: Lowella Grip M.D.   On: 06/15/2014 13:14   US Paracentesis  06/15/2014   CLINICAL DATA:  Ascites  EXAM: ULTRASOUND GUIDED  PARACENTESIS  COMPARISON:  None.  PROCEDURE: An ultrasound guided paracentesis was thoroughly discussed with the patient and questions answered. The benefits, risks, alternatives and complications were also discussed. The patient understands and wishes to proceed with the procedure. Written consent was obtained.  Ultrasound was performed to localize and mark an adequate pocket of fluid in the right lower quadrant of the abdomen. The area was then prepped and draped in the normal sterile fashion. 1% Lidocaine was used for local anesthesia. Under ultrasound guidance a 19 gauge Yueh catheter was introduced. Paracentesis was performed. The catheter was removed and a dressing applied.  Complications: None.  FINDINGS: A total of approximately 2.8 L of clear yellow fluid was removed. A fluid sample was not sent for laboratory analysis.  IMPRESSION: Successful ultrasound guided paracentesis yielding 2.8 L of ascites.   Electronically Signed   By:  Rolm Baptise M.D.   On: 06/15/2014 14:07   Dg Chest Port 1 View  07/04/2014   CLINICAL DATA:  Endotracheal tube placement  EXAM: PORTABLE CHEST - 1 VIEW  COMPARISON:  04/25/2014  FINDINGS: Cardiomediastinal silhouette is stable. No acute infiltrate or pulmonary edema. Mild left basilar atelectasis. Endotracheal tube in place with tip 2.6 cm above the carina. No pneumothorax.  IMPRESSION: Endotracheal tube in place with tip 2.6 cm above the carina. No pneumothorax.   Electronically Signed   By: Lahoma Crocker M.D.   On: 07/04/2014 12:40    Microbiology: Recent Results (from the past 240 hour(s))  MRSA PCR Screening     Status: None   Collection Time: 07/03/14  6:05 PM  Result Value Ref Range Status   MRSA by PCR NEGATIVE NEGATIVE Final    Comment:        The GeneXpert MRSA Assay (FDA approved for NASAL specimens only), is one component of a comprehensive MRSA colonization surveillance program. It is not intended to diagnose MRSA infection nor to guide or monitor treatment for MRSA infections.      Labs: Basic Metabolic Panel:  Recent Labs Lab 07/03/14 1542 07/03/14 2003 07/04/14 0542 07/05/14 0548 07/06/14 0624  NA 139 138 138 139 138  K 2.7* 3.9 4.4 3.9 3.5*  CL 95* 98 101 104 106  CO2 24 22 22 20 19   GLUCOSE 80 90 101* 109* 113*  BUN 32* 32* 32* 27* 22  CREATININE 1.22* 1.19* 1.28* 1.34* 1.53*  CALCIUM 8.7 8.1* 8.5 8.5 8.4  MG 1.1*  --   --   --   --    Liver Function Tests:  Recent Labs Lab 07/03/14 1542 07/04/14 0542 07/05/14 0548 07/06/14 0624  AST 73* 71* 74* 56*  ALT 8 8 8 7   ALKPHOS 90 91 86 86  BILITOT 3.0* 5.3* 2.1* 1.9*  PROT 7.7 7.4 7.2 7.0  ALBUMIN 2.6* 2.5* 2.3* 2.4*    Recent Labs Lab 07/03/14 1542  LIPASE 52    Recent Labs Lab 07/03/14 1543 07/06/14 0624  AMMONIA 34 39   CBC:  Recent Labs Lab 07/03/14 1542  07/04/14  2595 07/04/14 1153 07/04/14 1959 07/05/14 0548 07/06/14 0624 07/07/14 0949  WBC 8.0  --  6.1  --   --  5.4  6.8 6.8  NEUTROABS 5.8  --   --   --   --   --   --   --   HGB 6.7*  < > 8.9* 8.3* 8.9* 8.9* 8.4* 8.3*  HCT 19.8*  < > 26.3* 25.0* 26.4* 25.6* 25.1* 25.1*  MCV 106.5*  --  98.1  --   --  98.1 99.2 100.4*  PLT 102*  --  97*  --   --  86* 103* 100*  < > = values in this interval not displayed. Cardiac Enzymes:  Recent Labs Lab 07/03/14 1542  TROPONINI <0.30   BNP: BNP (last 3 results) No results for input(s): PROBNP in the last 8760 hours. CBG: No results for input(s): GLUCAP in the last 168 hours.   Signed:  CHIU, Orpah Melter  [facility] 07/07/2014, 1:43 PM

## 2014-07-07 NOTE — Progress Notes (Signed)
Patient without complaints. No further bleeding.  Discussed with nursing staff. Sandostatin has been running at 50 g per hour.   Vital signs in last 24 hours: Temp:  [97.7 F (36.5 C)-98.5 F (36.9 C)] 97.8 F (36.6 C) (11/28 0540) Pulse Rate:  [54-61] 54 (11/28 0540) Resp:  [18-20] 18 (11/28 0540) BP: (76-92)/(43-65) 92/61 mmHg (11/28 0915) SpO2:  [99 %-100 %] 100 % (11/28 0540) Last BM Date: 07/06/14 General:   Alert,  Well-developed, well-nourished, pleasant and cooperative in NAD Abdomen:  Mildly distended. Positive bowel sounds. Soft and nontender  Extremities:  Without clubbing or edema.    Intake/Output from previous day: 11/27 0701 - 11/28 0700 In: 720 [P.O.:720] Out: 200 [Urine:200] Intake/Output this shift:    Lab Results:  Recent Labs  07/05/14 0548 07/06/14 0624 07/07/14 0949  WBC 5.4 6.8 6.8  HGB 8.9* 8.4* 8.3*  HCT 25.6* 25.1* 25.1*  PLT 86* 103* 100*   BMET  Recent Labs  07/05/14 0548 07/06/14 0624  NA 139 138  K 3.9 3.5*  CL 104 106  CO2 20 19  GLUCOSE 109* 113*  BUN 27* 22  CREATININE 1.34* 1.53*  CALCIUM 8.5 8.4   LFT  Recent Labs  07/06/14 0624  PROT 7.0  ALBUMIN 2.4*  AST 56*  ALT 7  ALKPHOS 86  BILITOT 1.9*    Impression:   Variceal hemorrhage resolved. Patient interested in transplant evaluation.  Recommendations:   Taper Sandostatin. Levaquin 5 days. Carafate 5 days. Continue Protonix 40 mg once daily. I have arranged follow-up with Dr. Oneida Alar in 2 weeks. We will initiate referral for liver transplant evaluation.

## 2014-07-08 NOTE — Telephone Encounter (Signed)
PT NEEDS A CBC/PT/INR DEC 8. IF Hb < 10, SHE NEEDS 2u pRBCs TO BE TRANSFUSED. ON DEC 14 PT NEEDS TO BE TYPE & CROSS FOR 4u pRBCs, AND HOLD FOR EGD ON DEC 15. EGD FOR VARICEAL BANDING WITH PROPOFOL ON DEC 15.

## 2014-07-08 NOTE — Telephone Encounter (Signed)
REFER PT TO [facility] FOR LIVER TRANSPLANT EVALUATION.

## 2014-07-09 ENCOUNTER — Encounter (HOSPITAL_COMMUNITY): Payer: Self-pay | Admitting: Internal Medicine

## 2014-07-09 NOTE — Care Management Utilization Note (Signed)
UR review complete.  

## 2014-07-10 ENCOUNTER — Other Ambulatory Visit: Payer: Self-pay

## 2014-07-11 ENCOUNTER — Other Ambulatory Visit: Payer: Self-pay

## 2014-07-11 DIAGNOSIS — K920 Hematemesis: Secondary | ICD-10-CM

## 2014-07-11 DIAGNOSIS — K7460 Unspecified cirrhosis of liver: Secondary | ICD-10-CM

## 2014-07-11 DIAGNOSIS — K7581 Nonalcoholic steatohepatitis (NASH): Secondary | ICD-10-CM

## 2014-07-11 DIAGNOSIS — I8501 Esophageal varices with bleeding: Secondary | ICD-10-CM

## 2014-07-11 NOTE — Telephone Encounter (Signed)
Tried to call patient with no answer  

## 2014-07-11 NOTE — Telephone Encounter (Signed)
Talked with [name](boyfriend) and he is going to have her call me back

## 2014-07-13 NOTE — Telephone Encounter (Signed)
Talked with her today and she is aware of everything. Instruction are in the mail for her.

## 2014-07-18 ENCOUNTER — Telehealth: Payer: Self-pay

## 2014-07-18 LAB — CBC WITH DIFFERENTIAL/PLATELET
BASOS ABS: 0.1 10*3/uL (ref 0.0–0.1)
BASOS PCT: 1 % (ref 0–1)
Eosinophils Absolute: 0.1 10*3/uL (ref 0.0–0.7)
Eosinophils Relative: 1 % (ref 0–5)
HCT: 25.5 % — ABNORMAL LOW (ref 36.0–46.0)
Hemoglobin: 9.1 g/dL — ABNORMAL LOW (ref 12.0–15.0)
LYMPHS PCT: 23 % (ref 12–46)
Lymphs Abs: 1.6 10*3/uL (ref 0.7–4.0)
MCH: 32.9 pg (ref 26.0–34.0)
MCHC: 35.7 g/dL (ref 30.0–36.0)
MCV: 92.1 fL (ref 78.0–100.0)
MONO ABS: 0.4 10*3/uL (ref 0.1–1.0)
MPV: 9.2 fL — AB (ref 9.4–12.4)
Monocytes Relative: 5 % (ref 3–12)
NEUTROS ABS: 4.9 10*3/uL (ref 1.7–7.7)
NEUTROS PCT: 70 % (ref 43–77)
PLATELETS: 97 10*3/uL — AB (ref 150–400)
RBC: 2.77 MIL/uL — AB (ref 3.87–5.11)
RDW: 20.3 % — AB (ref 11.5–15.5)
WBC: 7 10*3/uL (ref 4.0–10.5)

## 2014-07-18 LAB — PROTIME-INR
INR: 1.22 (ref <1.50)
Prothrombin Time: 15.4 seconds — ABNORMAL HIGH (ref 11.6–15.2)

## 2014-07-18 NOTE — Telephone Encounter (Signed)
Hgb 9.1. Needs 2 units PRBCs, pre-medicated as per protocol.   Make sure that on Dec 14, she has type/cross done for 4 units PRBCs and HOLD for EGD on Dec 15th.

## 2014-07-18 NOTE — Telephone Encounter (Signed)
Per [facility] patient had blood work done on 07/17/14. Her results are in the computer but her HGB is 9.1 and [facility] said that she should receive 2 units of PRBC's (she phone note from 07/08/14).

## 2014-07-19 ENCOUNTER — Encounter (HOSPITAL_COMMUNITY)
Admission: RE | Admit: 2014-07-19 | Discharge: 2014-07-19 | Disposition: A | Discharge status: Home / Self Care | Payer: Medicare Other | Source: Ambulatory Visit | Attending: Gastroenterology | Admitting: Gastroenterology

## 2014-07-19 DIAGNOSIS — I8501 Esophageal varices with bleeding: Secondary | ICD-10-CM | POA: Insufficient documentation

## 2014-07-19 LAB — HEMOGLOBIN AND HEMATOCRIT, BLOOD
HEMATOCRIT: 26.7 % — AB (ref 36.0–46.0)
Hemoglobin: 9.1 g/dL — ABNORMAL LOW (ref 12.0–15.0)

## 2014-07-19 LAB — PREPARE RBC (CROSSMATCH)

## 2014-07-19 NOTE — Patient Instructions (Signed)
Wanda [name]  07/19/2014   Your procedure is scheduled on:  07/24/2014  Report to [facility] at  900  AM.  Call this number if you have problems the morning of surgery: [phone]   Remember:   Do not eat food or drink liquids after midnight.   Take these medicines the morning of surgery with A SIP OF WATER: midodrine, oxycodone, protonix, propranolol,spironalactone   Do not wear jewelry, make-up or nail polish.  Do not wear lotions, powders, or perfumes.   Do not shave 48 hours prior to surgery. Men may shave face and neck.  Do not bring valuables to the hospital.  [facility] is not responsible for any belongings or valuables.               Contacts, dentures or bridgework may not be worn into surgery.  Leave suitcase in the car. After surgery it may be brought to your room.  For patients admitted to the hospital, discharge time is determined by your treatment team.               Patients discharged the day of surgery will not be allowed to drive home.  Name and phone number of your driver: family  Special Instructions: N/A   Please read over the following fact sheets that you were given: Pain Booklet, Coughing and Deep Breathing, Surgical Site Infection Prevention, Anesthesia Post-op Instructions and Care and Recovery After Surgery Esophagogastroduodenoscopy Esophagogastroduodenoscopy (EGD) is a procedure to examine the lining of the esophagus, stomach, and first part of the small intestine (duodenum). A long, flexible, lighted tube with a camera attached (endoscope) is inserted down the throat to view these organs. This procedure is done to detect problems or abnormalities, such as inflammation, bleeding, ulcers, or growths, in order to treat them. The procedure lasts about 5-20 minutes. It is usually an outpatient procedure, but it may need to be performed in emergency cases in the hospital. LET YOUR CAREGIVER KNOW ABOUT:   Allergies to food or medicine.  All  medicines you are taking, including vitamins, herbs, eyedrops, and over-the-counter medicines and creams.  Use of steroids (by mouth or creams).  Previous problems you or members of your family have had with the use of anesthetics.  Any blood disorders you have.  Previous surgeries you have had.  Other health problems you have.  Possibility of pregnancy, if this applies. RISKS AND COMPLICATIONS  Generally, EGD is a safe procedure. However, as with any procedure, complications can occur. Possible complications include:  Infection.  Bleeding.  Tearing (perforation) of the esophagus, stomach, or duodenum.  Difficulty breathing or not being able to breath.  Excessive sweating.  Spasms of the larynx.  Slowed heartbeat.  Low blood pressure. BEFORE THE PROCEDURE  Do not eat or drink anything for 6-8 hours before the procedure or as directed by your caregiver.  Ask your caregiver about changing or stopping your regular medicines.  If you wear dentures, be prepared to remove them before the procedure.  Arrange for someone to drive you home after the procedure. PROCEDURE   A vein will be accessed to give medicines and fluids. A medicine to relax you (sedative) and a pain reliever will be given through that access into the vein.  A numbing medicine (local anesthetic) may be sprayed on your throat for comfort and to stop you from gagging or coughing.  A mouth guard may be placed in your mouth to protect your teeth and  to keep you from biting on the endoscope.  You will be asked to lie on your left side.  The endoscope is inserted down your throat and into the esophagus, stomach, and duodenum.  Air is put through the endoscope to allow your caregiver to view the lining of your esophagus clearly.  The esophagus, stomach, and duodenum is then examined. During the exam, your caregiver may:  Remove tissue to be examined under a microscope (biopsy) for inflammation, infection,  or other medical problems.  Remove growths.  Remove objects (foreign bodies) that are stuck.  Treat any bleeding with medicines or other devices that stop tissues from bleeding (hot cautery, clipping devices).  Widen (dilate) or stretch narrowed areas of the esophagus and stomach.  The endoscope will then be withdrawn. AFTER THE PROCEDURE  You will be taken to a recovery area to be monitored. You will be able to go home once you are stable and alert.  Do not eat or drink anything until the local anesthetic and numbing medicines have worn off. You may choke.  It is normal to feel bloated, have pain with swallowing, or have a sore throat for a short time. This will wear off.  Your caregiver should be able to discuss his or her findings with you. It will take longer to discuss the test results if any biopsies were taken. Document Released: 11/27/2004 Document Revised: 12/11/2013 Document Reviewed: 06/29/2012 [facility] Patient Information 2015 [place], [place]. This information is not intended to replace advice given to you by your health care provider. Make sure you discuss any questions you have with your health care provider. PATIENT INSTRUCTIONS POST-ANESTHESIA  IMMEDIATELY FOLLOWING SURGERY:  Do not drive or operate machinery for the first twenty four hours after surgery.  Do not make any important decisions for twenty four hours after surgery or while taking narcotic pain medications or sedatives.  If you develop intractable nausea and vomiting or a severe headache please notify your doctor immediately.  FOLLOW-UP:  Please make an appointment with your surgeon as instructed. You do not need to follow up with anesthesia unless specifically instructed to do so.  WOUND CARE INSTRUCTIONS (if applicable):  Keep a dry clean dressing on the anesthesia/puncture wound site if there is drainage.  Once the wound has quit draining you may leave it open to air.  Generally you should leave the bandage  intact for twenty four hours unless there is drainage.  If the epidural site drains for more than 36-48 hours please call the anesthesia department.  QUESTIONS?:  Please feel free to call your physician or the hospital operator if you have any questions, and they will be happy to assist you.

## 2014-07-19 NOTE — Telephone Encounter (Signed)
I called Wanda [name] at the hospital and she told me to fax the orders to her, and have pt come over today at 1:00 pm for type and cross.  I called and could not get pt on her number and I called the emergency contact and spoke to [place] ( her significant other) and he said he will tell her and get her to the hospital today.

## 2014-07-19 NOTE — Telephone Encounter (Signed)
Orders have been faxed to Manuela Schwartz at the hospital.

## 2014-07-20 ENCOUNTER — Ambulatory Visit (HOSPITAL_COMMUNITY): Admission: RE | Admit: 2014-07-20 | Payer: Medicare Other | Source: Ambulatory Visit | Admitting: Gastroenterology

## 2014-07-20 ENCOUNTER — Encounter (HOSPITAL_COMMUNITY)
Admission: RE | Admit: 2014-07-20 | Discharge: 2014-07-20 | Disposition: A | Discharge status: Home / Self Care | Payer: Medicare Other | Source: Ambulatory Visit | Attending: Gastroenterology | Admitting: Gastroenterology

## 2014-07-20 ENCOUNTER — Encounter (HOSPITAL_COMMUNITY): Admission: RE | Payer: Self-pay | Source: Ambulatory Visit

## 2014-07-20 DIAGNOSIS — I8501 Esophageal varices with bleeding: Secondary | ICD-10-CM | POA: Diagnosis not present

## 2014-07-20 SURGERY — EGD (ESOPHAGOGASTRODUODENOSCOPY)
Anesthesia: Moderate Sedation

## 2014-07-20 MED ORDER — ACETAMINOPHEN 325 MG PO TABS
ORAL_TABLET | ORAL | Status: AC
  Filled 2014-07-20: qty 2

## 2014-07-20 MED ORDER — DIPHENHYDRAMINE HCL 50 MG/ML IJ SOLN
INTRAMUSCULAR | Status: AC
  Filled 2014-07-20: qty 1

## 2014-07-20 MED ORDER — DIPHENHYDRAMINE HCL 50 MG/ML IJ SOLN
25.0000 mg | Freq: Once | INTRAMUSCULAR | Status: AC
  Administered 2014-07-20: 25 mg via INTRAVENOUS

## 2014-07-20 MED ORDER — SODIUM CHLORIDE 0.9 % IV SOLN
Freq: Once | INTRAVENOUS | Status: AC
  Administered 2014-07-20: 09:00:00 via INTRAVENOUS

## 2014-07-20 MED ORDER — ACETAMINOPHEN 325 MG PO TABS
650.0000 mg | ORAL_TABLET | Freq: Once | ORAL | Status: AC
  Administered 2014-07-20: 650 mg via ORAL

## 2014-07-20 NOTE — Progress Notes (Signed)
Tolerated 2 units blood without difficulty.

## 2014-07-21 LAB — TYPE AND SCREEN
ABO/RH(D): A POS
Antibody Screen: NEGATIVE
Unit division: 0
Unit division: 0

## 2014-07-23 ENCOUNTER — Encounter (HOSPITAL_COMMUNITY): Payer: Self-pay

## 2014-07-23 ENCOUNTER — Encounter (HOSPITAL_COMMUNITY)
Admission: RE | Admit: 2014-07-23 | Discharge: 2014-07-23 | Disposition: A | Discharge status: Home / Self Care | Payer: Medicare Other | Source: Ambulatory Visit | Attending: Gastroenterology | Admitting: Gastroenterology

## 2014-07-23 DIAGNOSIS — Z800 Family history of malignant neoplasm of digestive organs: Secondary | ICD-10-CM | POA: Diagnosis not present

## 2014-07-23 DIAGNOSIS — D7589 Other specified diseases of blood and blood-forming organs: Secondary | ICD-10-CM | POA: Diagnosis not present

## 2014-07-23 DIAGNOSIS — I8500 Esophageal varices without bleeding: Secondary | ICD-10-CM | POA: Diagnosis present

## 2014-07-23 DIAGNOSIS — K219 Gastro-esophageal reflux disease without esophagitis: Secondary | ICD-10-CM | POA: Diagnosis not present

## 2014-07-23 DIAGNOSIS — I8501 Esophageal varices with bleeding: Secondary | ICD-10-CM | POA: Diagnosis not present

## 2014-07-23 DIAGNOSIS — A047 Enterocolitis due to Clostridium difficile: Secondary | ICD-10-CM | POA: Diagnosis not present

## 2014-07-23 DIAGNOSIS — K760 Fatty (change of) liver, not elsewhere classified: Secondary | ICD-10-CM | POA: Diagnosis not present

## 2014-07-23 DIAGNOSIS — Z8249 Family history of ischemic heart disease and other diseases of the circulatory system: Secondary | ICD-10-CM | POA: Diagnosis not present

## 2014-07-23 DIAGNOSIS — Z888 Allergy status to other drugs, medicaments and biological substances status: Secondary | ICD-10-CM | POA: Diagnosis not present

## 2014-07-23 DIAGNOSIS — N179 Acute kidney failure, unspecified: Secondary | ICD-10-CM | POA: Diagnosis not present

## 2014-07-23 DIAGNOSIS — Z8379 Family history of other diseases of the digestive system: Secondary | ICD-10-CM | POA: Diagnosis not present

## 2014-07-23 DIAGNOSIS — Z87891 Personal history of nicotine dependence: Secondary | ICD-10-CM | POA: Diagnosis not present

## 2014-07-23 DIAGNOSIS — R601 Generalized edema: Secondary | ICD-10-CM | POA: Diagnosis not present

## 2014-07-23 DIAGNOSIS — K920 Hematemesis: Secondary | ICD-10-CM | POA: Diagnosis not present

## 2014-07-23 DIAGNOSIS — K7460 Unspecified cirrhosis of liver: Secondary | ICD-10-CM | POA: Diagnosis not present

## 2014-07-23 LAB — BASIC METABOLIC PANEL
BUN: 18 mg/dL (ref 6–23)
CALCIUM: 8.4 mg/dL (ref 8.4–10.5)
CO2: 26 mEq/L (ref 19–32)
CREATININE: 1.22 mg/dL — AB (ref 0.50–1.10)
Chloride: 90 mEq/L — ABNORMAL LOW (ref 96–112)
GFR, EST AFRICAN AMERICAN: 63 mL/min — AB (ref >90)
GFR, EST NON AFRICAN AMERICAN: 55 mL/min — AB (ref >90)
Glucose, Bld: 60 mg/dL — ABNORMAL LOW (ref 70–99)
Potassium: 2.7 mEq/L — CL (ref 3.7–5.3)
Sodium: 139 mEq/L (ref 137–147)

## 2014-07-23 LAB — HEMOGLOBIN AND HEMATOCRIT, BLOOD
HEMATOCRIT: 32.8 % — AB (ref 36.0–46.0)
HEMOGLOBIN: 11.2 g/dL — AB (ref 12.0–15.0)

## 2014-07-23 LAB — PREPARE RBC (CROSSMATCH)

## 2014-07-23 NOTE — Pre-Procedure Instructions (Signed)
Lab calls with critical K+- 2.7. Dr Patsey Berthold notified and wants I stat on arrival in am and to use as large an angiocath as possible in am in case patient needs blood. Dr Oneida Alar also notified and she wants patient to increase potassium today from 10 mEq TID to 30 mEq Tid. Attempted to call patient and no answer. Called patients significant other and left message for hime to call back concerning her low iron and procedure for tomorrow.

## 2014-07-24 ENCOUNTER — Encounter (HOSPITAL_COMMUNITY)
Admission: RE | Disposition: A | Discharge status: Home / Self Care | Payer: Self-pay | Source: Ambulatory Visit | Attending: Gastroenterology

## 2014-07-24 ENCOUNTER — Ambulatory Visit (HOSPITAL_COMMUNITY): Payer: Medicare Other | Admitting: Anesthesiology

## 2014-07-24 ENCOUNTER — Telehealth: Payer: Self-pay | Admitting: Gastroenterology

## 2014-07-24 ENCOUNTER — Other Ambulatory Visit: Payer: Self-pay

## 2014-07-24 ENCOUNTER — Ambulatory Visit (HOSPITAL_COMMUNITY)
Admission: RE | Admit: 2014-07-24 | Discharge: 2014-07-24 | Disposition: A | Discharge status: Home / Self Care | Payer: Medicare Other | Source: Ambulatory Visit | Attending: Gastroenterology | Admitting: Gastroenterology

## 2014-07-24 ENCOUNTER — Encounter (HOSPITAL_COMMUNITY): Payer: Self-pay

## 2014-07-24 DIAGNOSIS — Z888 Allergy status to other drugs, medicaments and biological substances status: Secondary | ICD-10-CM | POA: Insufficient documentation

## 2014-07-24 DIAGNOSIS — K7460 Unspecified cirrhosis of liver: Secondary | ICD-10-CM | POA: Insufficient documentation

## 2014-07-24 DIAGNOSIS — R601 Generalized edema: Secondary | ICD-10-CM | POA: Insufficient documentation

## 2014-07-24 DIAGNOSIS — Z87891 Personal history of nicotine dependence: Secondary | ICD-10-CM | POA: Insufficient documentation

## 2014-07-24 DIAGNOSIS — D7589 Other specified diseases of blood and blood-forming organs: Secondary | ICD-10-CM | POA: Insufficient documentation

## 2014-07-24 DIAGNOSIS — A047 Enterocolitis due to Clostridium difficile: Secondary | ICD-10-CM | POA: Insufficient documentation

## 2014-07-24 DIAGNOSIS — K760 Fatty (change of) liver, not elsewhere classified: Secondary | ICD-10-CM | POA: Insufficient documentation

## 2014-07-24 DIAGNOSIS — Z800 Family history of malignant neoplasm of digestive organs: Secondary | ICD-10-CM | POA: Insufficient documentation

## 2014-07-24 DIAGNOSIS — K920 Hematemesis: Secondary | ICD-10-CM | POA: Insufficient documentation

## 2014-07-24 DIAGNOSIS — I8501 Esophageal varices with bleeding: Secondary | ICD-10-CM | POA: Insufficient documentation

## 2014-07-24 DIAGNOSIS — I8500 Esophageal varices without bleeding: Secondary | ICD-10-CM | POA: Insufficient documentation

## 2014-07-24 DIAGNOSIS — Z8249 Family history of ischemic heart disease and other diseases of the circulatory system: Secondary | ICD-10-CM | POA: Insufficient documentation

## 2014-07-24 DIAGNOSIS — Z8379 Family history of other diseases of the digestive system: Secondary | ICD-10-CM | POA: Insufficient documentation

## 2014-07-24 DIAGNOSIS — N179 Acute kidney failure, unspecified: Secondary | ICD-10-CM | POA: Insufficient documentation

## 2014-07-24 DIAGNOSIS — K219 Gastro-esophageal reflux disease without esophagitis: Secondary | ICD-10-CM | POA: Diagnosis not present

## 2014-07-24 DIAGNOSIS — R188 Other ascites: Secondary | ICD-10-CM

## 2014-07-24 HISTORY — PX: ESOPHAGEAL BANDING: SHX5518

## 2014-07-24 HISTORY — PX: ESOPHAGOGASTRODUODENOSCOPY (EGD) WITH PROPOFOL: SHX5813

## 2014-07-24 LAB — POCT I-STAT 4, (NA,K, GLUC, HGB,HCT)
GLUCOSE: 78 mg/dL (ref 70–99)
HCT: 34 % — ABNORMAL LOW (ref 36.0–46.0)
Hemoglobin: 11.6 g/dL — ABNORMAL LOW (ref 12.0–15.0)
Potassium: 2.8 mEq/L — CL (ref 3.7–5.3)
SODIUM: 137 meq/L (ref 137–147)

## 2014-07-24 SURGERY — ESOPHAGOGASTRODUODENOSCOPY (EGD) WITH PROPOFOL
Anesthesia: Monitor Anesthesia Care

## 2014-07-24 MED ORDER — CIPROFLOXACIN IN D5W 400 MG/200ML IV SOLN
400.0000 mg | Freq: Once | INTRAVENOUS | Status: AC
  Administered 2014-07-24: 400 mg via INTRAVENOUS

## 2014-07-24 MED ORDER — CIPROFLOXACIN IN D5W 400 MG/200ML IV SOLN
INTRAVENOUS | Status: DC | PRN
  Administered 2014-07-24: 400 mg via INTRAVENOUS

## 2014-07-24 MED ORDER — STERILE WATER FOR IRRIGATION IR SOLN
Status: DC | PRN
  Administered 2014-07-24: 1000 mL

## 2014-07-24 MED ORDER — SODIUM CHLORIDE 0.9 % IV SOLN
INTRAVENOUS | Status: DC | PRN
  Administered 2014-07-24: 11:00:00 via INTRAVENOUS

## 2014-07-24 MED ORDER — SODIUM CHLORIDE 0.9 % IV SOLN: Freq: Once | INTRAVENOUS | Status: DC

## 2014-07-24 MED ORDER — FENTANYL CITRATE 0.05 MG/ML IJ SOLN
25.0000 ug | INTRAMUSCULAR | Status: AC
  Administered 2014-07-24 (×2): 25 ug via INTRAVENOUS

## 2014-07-24 MED ORDER — ONDANSETRON HCL 4 MG/2ML IJ SOLN
INTRAMUSCULAR | Status: AC
  Filled 2014-07-24: qty 2

## 2014-07-24 MED ORDER — LIDOCAINE VISCOUS 2 % MT SOLN
OROMUCOSAL | Status: AC
  Administered 2014-07-24: 11:00:00
  Filled 2014-07-24: qty 15

## 2014-07-24 MED ORDER — SODIUM CHLORIDE 0.9 % IV SOLN: INTRAVENOUS | Status: DC | PRN

## 2014-07-24 MED ORDER — FENTANYL CITRATE 0.05 MG/ML IJ SOLN
INTRAMUSCULAR | Status: AC
  Filled 2014-07-24: qty 2

## 2014-07-24 MED ORDER — PROPOFOL INFUSION 10 MG/ML OPTIME
INTRAVENOUS | Status: DC | PRN
  Administered 2014-07-24: 100 ug/kg/min via INTRAVENOUS

## 2014-07-24 MED ORDER — ONDANSETRON HCL 4 MG/2ML IJ SOLN: 4.0000 mg | Freq: Once | INTRAMUSCULAR | Status: DC | PRN

## 2014-07-24 MED ORDER — MIDAZOLAM HCL 2 MG/2ML IJ SOLN
INTRAMUSCULAR | Status: AC
  Filled 2014-07-24: qty 2

## 2014-07-24 MED ORDER — LACTATED RINGERS IV SOLN: INTRAVENOUS | Status: DC

## 2014-07-24 MED ORDER — PROPOFOL 10 MG/ML IV BOLUS
INTRAVENOUS | Status: DC | PRN
  Administered 2014-07-24 (×2): 50 mg via INTRAVENOUS
  Administered 2014-07-24: 25 mg via INTRAVENOUS
  Administered 2014-07-24: 75 mg via INTRAVENOUS

## 2014-07-24 MED ORDER — MIDAZOLAM HCL 2 MG/2ML IJ SOLN
1.0000 mg | INTRAMUSCULAR | Status: DC | PRN
  Administered 2014-07-24: 2 mg via INTRAVENOUS

## 2014-07-24 MED ORDER — CIPROFLOXACIN HCL 500 MG PO TABS: ORAL_TABLET | ORAL | Status: DC

## 2014-07-24 MED ORDER — PROPOFOL 10 MG/ML IV BOLUS
INTRAVENOUS | Status: AC
  Filled 2014-07-24: qty 20

## 2014-07-24 MED ORDER — FENTANYL CITRATE 0.05 MG/ML IJ SOLN: 25.0000 ug | INTRAMUSCULAR | Status: DC | PRN

## 2014-07-24 MED ORDER — CIPROFLOXACIN IN D5W 400 MG/200ML IV SOLN
INTRAVENOUS | Status: AC
  Filled 2014-07-24: qty 200

## 2014-07-24 MED ORDER — ONDANSETRON HCL 4 MG/2ML IJ SOLN
4.0000 mg | Freq: Once | INTRAMUSCULAR | Status: AC
  Administered 2014-07-24: 4 mg via INTRAVENOUS

## 2014-07-24 MED ORDER — LIDOCAINE HCL (CARDIAC) 10 MG/ML IV SOLN: INTRAVENOUS | Status: DC | PRN

## 2014-07-24 SURGICAL SUPPLY — 9 items
BAND LIGATOR SUPER 7 2.8 (MISCELLANEOUS) ×2 IMPLANT
BLOCK BITE 60FR ADLT L/F BLUE (MISCELLANEOUS) ×3 IMPLANT
FLOOR PAD 36X40 (MISCELLANEOUS) ×3
KIT CLEAN ENDO COMPLIANCE (KITS) ×2 IMPLANT
MANIFOLD NEPTUNE II (INSTRUMENTS) ×3 IMPLANT
PAD FLOOR 36X40 (MISCELLANEOUS) IMPLANT
SYR 50ML LL SCALE MARK (SYRINGE) ×2 IMPLANT
TUBING IRRIGATION ENDOGATOR (MISCELLANEOUS) ×3 IMPLANT
WATER STERILE IRR 1000ML POUR (IV SOLUTION) ×2 IMPLANT

## 2014-07-24 NOTE — Telephone Encounter (Signed)
Tried to call pt- NA 

## 2014-07-24 NOTE — Addendum Note (Signed)
Addendum  created 07/24/14 1243 by Mickel Baas, CRNA   Modules edited: Charges VN

## 2014-07-24 NOTE — Addendum Note (Signed)
Addendum  created 07/24/14 1148 by Michele Rockers, CRNA   Modules edited: Anesthesia Medication Administration

## 2014-07-24 NOTE — Discharge Instructions (Signed)
You have ULCERS IN YOUR ESOPHAGUS FROM PRIOR BANDING. YOU HAD A FEW esophageal varices. I PLACED TWO BANDS.    TAKE POTASSIUM A TOTAL OF 3 PILLS FOR NEXT 3 DOSES AND THEN 2 THREE TIMES A DAY.  REPEAT BMP DEC 17 OR 18. HAVE BLOOD DRAWN DURING PARACENTESIS THUR DEC 17 OR FRI DEC18.   REPEAT CBC IN 2 WEEKS.  TAKE PROTONIX every morning.  TAKE ALL OF YOUR CIPRO AS PRESCRIBED.  REPEAT CBC IN 2 WEEKS.  FOLLOW UP IN 6 WEEKS FOR ENDOSCOPY TO ASSESS NEED FOR BANDING.  FOLLOW UP IN APR 2016.  UPPER ENDOSCOPY AFTER CARE Read the instructions outlined below and refer to this sheet in the next week. These discharge instructions provide you with general information on caring for yourself after you leave the hospital. While your treatment has been planned according to the most current medical practices available, unavoidable complications occasionally occur. If you have any problems or questions after discharge, call DR. Evo Aderman, [phone].  ACTIVITY  You may resume your regular activity, but move at a slower pace for the next 24 hours.   Take frequent rest periods for the next 24 hours.   Walking will help get rid of the air and reduce the bloated feeling in your belly (abdomen).   No driving for 24 hours (because of the medicine (anesthesia) used during the test).   You may shower.   Do not sign any important legal documents or operate any machinery for 24 hours (because of the anesthesia used during the test).    NUTRITION  Drink plenty of fluids.   You may resume your normal diet as instructed by your doctor.   Begin with a light meal and progress to your normal diet. Heavy or fried foods are harder to digest and may make you feel sick to your stomach (nauseated).   Avoid alcoholic beverages for 24 hours or as instructed.    MEDICATIONS  You may resume your normal medications.   WHAT YOU CAN EXPECT TODAY  Some feelings of bloating in the abdomen.   Passage of more gas  than usual.    IF YOU HAD A BIOPSY TAKEN DURING THE UPPER ENDOSCOPY:  Eat a soft diet IF YOU HAVE NAUSEA, BLOATING, ABDOMINAL PAIN, OR VOMITING.    FINDING OUT THE RESULTS OF YOUR TEST Not all test results are available during your visit. DR. Oneida Alar WILL CALL YOU WITHIN 7 DAYS OF YOUR PROCEDUE WITH YOUR RESULTS. Do not assume everything is normal if you have not heard from DR. Elishia Kaczorowski IN ONE WEEK, CALL HER OFFICE AT [phone].  SEEK IMMEDIATE MEDICAL ATTENTION AND CALL THE OFFICE: [phone] IF:  You have more than a spotting of blood in your stool.   Your belly is swollen (abdominal distention).   You are nauseated or vomiting.   You have a temperature over 101F.   You have abdominal pain or discomfort that is severe or gets worse throughout the day.

## 2014-07-24 NOTE — Telephone Encounter (Addendum)
TAKE POTASSIUM A TOTAL OF 3 PILLS FOR NEXT 3 DOSES AND THEN 2 THREE TIMES A DAY.   REPEAT BMP DEC 17 OR 18. HAVE BLOOD DRAWN DURING LV PARACENTESIS THUR DEC 17 OR 18. SCHEDULE a large volume paracentesis DEC 17 OR 18. SHE NEEDS ALBUMIN 25 GMS IV AT ONSET AND REPEAT AFTER 4 LS. SHE SHOULD HOLD DIURETICS THE DAY OF HERPROCEDURE. SHE NEEDS TO STRICTLY FOLLOW A LOW SALT DIET OR SHE WILL CONTINUE TO GAIN FLUID IN HIS BELLY.  REPEAT CBC IN 2 WEEKS.   FOLLOW UP IN 6 WEEKS FOR EGD WITH BANDING W/ MAC-CIPRO 400 MG IV IN PREOP. FOLLOW UP IN APR 2016 E30 CIRRHOSIS, ASCITES, VARICEAL BLEEDING

## 2014-07-24 NOTE — Transfer of Care (Signed)
Immediate Anesthesia Transfer of Care Note  Patient: Wanda [name]  Procedure(s) Performed: Procedure(s): ESOPHAGOGASTRODUODENOSCOPY (EGD) WITH PROPOFOL (N/A) ESOPHAGEAL BANDING (2 bands applied) (N/A)  Patient Location: PACU  Anesthesia Type:MAC  Level of Consciousness: awake, alert  and patient cooperative  Airway & Oxygen Therapy: Patient Spontanous Breathing and Patient connected to nasal cannula oxygen  Post-op Assessment: Report given to PACU RN, Post -op Vital signs reviewed and stable and Patient moving all extremities  Post vital signs: Reviewed and stable  Complications: No apparent anesthesia complications

## 2014-07-24 NOTE — H&P (Signed)
Primary Care Physician:  Default, Provider, MD Primary Gastroenterologist:  Dr. Oneida Alar  Pre-Procedure History & Physical: HPI:  Wanda [name] is a 38 y.o. female here for ESOPHAGEAL VARICES/UGIB NOV 2015  Past Medical History  Diagnosis Date  . GERD (gastroesophageal reflux disease)   . 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 c/w steatohepatitis  . Folate deficiency 09/2013  . Anasarca 10/10/2013  . Hematemesis/vomiting blood 02/24/2014  . Acute blood loss anemia 02/25/2014    Status post transfusion  . Macrocytosis 02/28/2014  . 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 Acute renal failure 09/2013    Pre-renal- resolved  . C. difficile colitis 04/19/2014  . Fatty liver disease, nonalcoholic 6759    Past Surgical History  Procedure Laterality Date  . None    . Paracentesis  Feb 2015    1180 fluid, negative fluid analysis.   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 Paracentesis  10/2013  . Colonoscopy N/A 12/19/2013    SLF:NO OBVIOUS SOURCE FOR ANEMIA IDETIFIED/ONE COLON POLYP REMOVED/Small internal hemorrhoids  . Esophagogastroduodenoscopy N/A 02/11/2014    Dr. Rourk:Esophageal varices with bleeding stigmata-status post esophageal band ligation therapy. Portal gastropathy  . Esophagogastroduodenoscopy N/A 07/04/2014    Procedure: ESOPHAGOGASTRODUODENOSCOPY (EGD);  Surgeon: Daneil Dolin, MD;  Location: [facility];  Service: Endoscopy;  Laterality: N/A;  945  . Esophageal banding  07/04/2014    Procedure: ESOPHAGEAL BANDING;  Surgeon: Daneil Dolin, MD;  Location: [facility];  Service: Endoscopy;;    Prior to Admission medications   Medication Sig Start Date End Date Taking? Authorizing Provider  levofloxacin (LEVAQUIN) 500 MG tablet Take 1 tablet (500 mg total) by mouth daily.  07/07/14  Yes Donne Hazel, MD  loperamide (IMODIUM) 2 MG capsule Take 1 capsule (2 mg total) by mouth 2 (two) times daily as needed for diarrhea or loose stools. 05/18/14  Yes Maryann Mikhail, DO  loratadine (CLARITIN) 10 MG tablet Take 1 tablet (10 mg total) by mouth daily. Patient taking differently: Take 10 mg by mouth daily as needed for allergies.  04/27/14  Yes Rexene Alberts, MD  magnesium oxide (MAG-OX) 400 (241.3 MG) MG tablet Take 1 tablet (400 mg total) by mouth daily. 04/27/14  Yes Rexene Alberts, MD  midodrine (PROAMATINE) 10 MG tablet Take 1 tablet (10 mg total) by mouth 2 (two) times daily with a meal. Medication to keep your blood pressure from falling. 04/27/14  Yes Rexene Alberts, MD  oxyCODONE 10 MG TABS Take 1 tablet (10 mg total) by mouth every 6 (six) hours as needed for moderate pain. 05/18/14  Yes Maryann Mikhail, DO  oxyCODONE 10 MG TABS Take 1 tablet (10 mg total) by mouth every 6 (six) hours as needed for moderate pain. 07/07/14  Yes Donne Hazel, MD  pantoprazole (PROTONIX) 40 MG tablet Take 1 tablet (40 mg total) by mouth daily. 07/07/14  Yes Donne Hazel, MD  potassium chloride (MICRO-K) 10 MEQ CR capsule Take 10 mEq by mouth 3 (three) times daily. 03/02/14  Yes Historical Provider, MD  propranolol (INDERAL) 20 MG tablet Take 1 tablet (20 mg total) by mouth 2 (two) times daily. 07/07/14  Yes Donne Hazel, MD  spironolactone (ALDACTONE) 50 MG tablet Start this 50 mg tablet 1 time daily after you finish your 25 mg tablets.  Patient taking differently: Take 50 mg by mouth daily.  04/27/14  Yes Rexene Alberts, MD  torsemide (DEMADEX) 10 MG tablet Take 3 tablets (30 mg total) by mouth daily. 04/27/14   Rexene Alberts, MD    Allergies as of 07/11/2014 - Review Complete 07/04/2014  Allergen Reaction Noted  . Lasix [furosemide]  04/12/2014    Family History  Problem Relation Age of Onset  . Heart disease Mother   . Colon cancer Neg Hx   . Liver disease Neg Hx     History    Social History  . Marital Status: Single    Spouse Name: N/A    Number of Children: 1  . Years of Education: N/A   Occupational History  . unemployed    Social History Main Topics  . Smoking status: Former Smoker -- 0.25 packs/day for 20 years    Types: Cigarettes    Quit date: 11/05/2008  . Smokeless tobacco: Never Used  . Alcohol Use: No  . Drug Use: No  . Sexual Activity:    Partners: Male    Birth Control/ Protection: None   Other Topics Concern  . Not on file   Social History Narrative    Review of Systems: See HPI, otherwise negative ROS   Physical Exam: BP 133/91 mmHg  Pulse 105  Temp(Src) 97.9 F (36.6 C) (Oral)  Resp 15  SpO2 98% General:   Alert,  pleasant and cooperative in NAD Head:  Normocephalic and atraumatic. Neck:  Supple; Lungs:  Clear throughout to auscultation.    Heart:  Regular rate and rhythm. Abdomen:  Soft, nontender and nondistended. Normal bowel sounds, without guarding, and without rebound.   Neurologic:  Alert and  oriented x4;  grossly normal neurologically.  Impression/Plan:    VARICES SCREENING PLAN: EGD/?EVL TODAY

## 2014-07-24 NOTE — Anesthesia Procedure Notes (Signed)
Procedure Name: MAC Date/Time: 07/24/2014 11:05 AM Performed by: Michele Rockers Pre-anesthesia Checklist: Patient identified, Emergency Drugs available, Suction available, Timeout performed and Patient being monitored Patient Re-evaluated:Patient Re-evaluated prior to inductionOxygen Delivery Method: Non-rebreather mask

## 2014-07-24 NOTE — Anesthesia Preprocedure Evaluation (Signed)
Anesthesia Evaluation  Patient identified by MRN, date of birth, ID band Patient awake    Reviewed: Allergy & Precautions, H&P , NPO status , Patient's Chart, lab work & pertinent test results, reviewed documented beta blocker date and time   Airway Mallampati: II  TM Distance: >3 FB     Dental  (+) Teeth Intact   Pulmonary former smoker,  breath sounds clear to auscultation        Cardiovascular negative cardio ROS  Rhythm:Regular Rate:Normal     Neuro/Psych PSYCHIATRIC DISORDERS Bipolar Disorder    GI/Hepatic GERD-  ,(+) Cirrhosis -  Esophageal Varices    ,   Endo/Other    Renal/GU ARFRenal disease     Musculoskeletal   Abdominal   Peds  Hematology  (+) anemia ,   Anesthesia Other Findings   Reproductive/Obstetrics                             Anesthesia Physical Anesthesia Plan  ASA: IV  Anesthesia Plan: MAC   Post-op Pain Management:    Induction: Intravenous  Airway Management Planned: Simple Face Mask  Additional Equipment:   Intra-op Plan:   Post-operative Plan: Possible Post-op intubation/ventilation  Informed Consent: I have reviewed the patients History and Physical, chart, labs and discussed the procedure including the risks, benefits and alternatives for the proposed anesthesia with the patient or authorized representative who has indicated his/her understanding and acceptance.     Plan Discussed with: CRNA and Surgeon  Anesthesia Plan Comments: (Possible intubation to protect airway from upper GI bleeding discussed and pt agrees.)        Anesthesia Quick Evaluation

## 2014-07-24 NOTE — Telephone Encounter (Signed)
Tried to call pt to notify her about her paracentesis and labs. No answer. Pt is scheduled for 07/26/2014 @ 1:15pm

## 2014-07-24 NOTE — Anesthesia Postprocedure Evaluation (Signed)
  Anesthesia Post-op Note  Patient: Wanda [name]  Procedure(s) Performed: Procedure(s): ESOPHAGOGASTRODUODENOSCOPY (EGD) WITH PROPOFOL (N/A) ESOPHAGEAL BANDING (2 bands applied) (N/A)  Patient Location: PACU  Anesthesia Type:MAC  Level of Consciousness: awake, alert , pateint uncooperative and responds to stimulation  Airway and Oxygen Therapy: Patient Spontanous Breathing and Patient connected to nasal cannula oxygen  Post-op Pain: none  Post-op Assessment: Post-op Vital signs reviewed, Patient's Cardiovascular Status Stable, Respiratory Function Stable, Patent Airway and Pain level controlled  Post-op Vital Signs: Reviewed and stable  Last Vitals:  Filed Vitals:   07/24/14 1134  BP: 118/74  Pulse: 109  Temp: 36.7 C  Resp: 19    Complications: No apparent anesthesia complications

## 2014-07-24 NOTE — Anesthesia Postprocedure Evaluation (Signed)
  Anesthesia Post-op Note  Patient: Wanda [name]  Procedure(s) Performed: Procedure(s): ESOPHAGOGASTRODUODENOSCOPY (EGD) WITH PROPOFOL (N/A) ESOPHAGEAL BANDING (2 bands applied) (N/A)  Patient Location: PACU  Anesthesia Type:MAC  Level of Consciousness: awake, alert  and patient cooperative  Airway and Oxygen Therapy: Patient Spontanous Breathing and Patient connected to nasal cannula oxygen  Post-op Pain: none  Post-op Assessment: Post-op Vital signs reviewed, Patient's Cardiovascular Status Stable, Respiratory Function Stable and Patent Airway  Post-op Vital Signs: Reviewed and stable  Last Vitals:  Filed Vitals:   07/24/14 1134  BP: 118/74  Pulse: 109  Temp: 36.7 C  Resp: 19    Complications: No apparent anesthesia complications

## 2014-07-24 NOTE — Telephone Encounter (Signed)
PATIENT NEEDS CBC 2 WEEKS

## 2014-07-25 ENCOUNTER — Encounter (HOSPITAL_COMMUNITY): Payer: Self-pay | Admitting: Gastroenterology

## 2014-07-25 NOTE — Anesthesia Postprocedure Evaluation (Signed)
  Anesthesia Post-op Note  Patient: Wanda [name]  Procedure(s) Performed: Procedure(s): ESOPHAGOGASTRODUODENOSCOPY (EGD) WITH PROPOFOL (N/A) ESOPHAGEAL BANDING (2 bands applied) (N/A)  Patient Location: 3a  Anesthesia Type:MAC  Level of Consciousness: awake, alert , oriented and patient cooperative  Airway and Oxygen Therapy: Patient Spontanous Breathing  Post-op Pain: none  Post-op Assessment: Post-op Vital signs reviewed, Patient's Cardiovascular Status Stable, Respiratory Function Stable, Patent Airway and Pain level controlled  Post-op Vital Signs: Reviewed and stable  Last Vitals:  Filed Vitals:   07/24/14 1226  BP: 120/81  Pulse: 112  Temp:   Resp: 16    Complications: No apparent anesthesia complications

## 2014-07-25 NOTE — Telephone Encounter (Signed)
Called pt again to follow up about her procedure and labs for 07/26/2014. Unable to leave message. No answer.

## 2014-07-25 NOTE — Addendum Note (Signed)
Addendum  created 07/25/14 1338 by Charmaine Downs, CRNA   Modules edited: Notes Section   Notes Section:  File: [id number]

## 2014-07-25 NOTE — Op Note (Signed)
[facility] [address] [place], [address]   ENDOSCOPY PROCEDURE REPORT  PATIENT: Wanda [name], Wanda [name]  MR#: [id number] BIRTHDATE: 1975/12/15 , 38  yrs. old GENDER: female  ENDOSCOPIST: Barney Drain, MD REFERRED BY: PROCEDURE DATE: 07/24/2014 PROCEDURE:   EGD w/ band ligation of varices  INDICATIONS:screening for varices. MEDICATIONS: Monitored anesthesia care TOPICAL ANESTHETIC:   Viscous Xylocaine ASA CLASS:  DESCRIPTION OF PROCEDURE:     Physical exam was performed.  Informed consent was obtained from the patient after explaining the benefits, risks, and alternatives to the procedure.  The patient was connected to the monitor and placed in the left lateral position.  Continuous oxygen was provided by nasal cannula and IV medicine administered through an indwelling cannula.  After administration of sedation, the patients esophagus was intubated and the     endoscope was advanced under direct visualization to the second portion of the duodenum.  The scope was removed slowly by carefully examining the color, texture, anatomy, and integrity of the mucosa on the way out.  The patient was recovered in endoscopy and discharged home in satisfactory condition.   ESOPHAGUS: 2 columns grade 2-3 varices. 2 BANDS APPLIED.  MULTIPLE ESOPHAGEAL ULCERS FROM PRIOR BANDING.   STOMACH: Moderate gastropathy was found in the entire examined stomach.   DUODENUM: DUODENAL DIVERTICULA.   The duodenal mucosa showed no abnormalities in the duodenal bulb. COMPLICATIONS: There were no immediate complications.  ENDOSCOPIC IMPRESSION: 1.   2 columns grade 2-3 varices- 2 BANDS APPLIED. 2.   MODERATE Gastropathy 3.   DUODENAL DIVERTICULA  RECOMMENDATIONS: TAKE POTASSIUM A TOTAL OF 3 PILLS FOR NEXT 3 DOSES AND THEN 2 THREE TIMES A DAY. REPEAT BMP DEC 17 OR 18.  HAVE BLOOD DRAWN DURING PARACENTESIS THUR DEC 17 OR FRI DEC18. REPEAT CBC IN 2 WEEKS. TAKE PROTONIX every morning. TAKE  ALL OF YOUR CIPRO AS PRESCRIBED. REPEAT CBC IN 2 WEEKS. FOLLOW UP IN 6 WEEKS FOR ENDOSCOPY TO ASSESS NEED FOR BANDING. FOLLOW UP IN APR 2016.  REPEAT EXAM: _______________________________ eSignedBarney Drain, MD August 06, 2014 4:27 PM  CPT CODES: ICD CODES:  The ICD and CPT codes recommended by this software are interpretations from the data that the clinical staff has captured with the software.  The verification of the translation of this report to the ICD and CPT codes and modifiers is the sole responsibility of the health care institution and practicing physician where this report was generated.  [employer] will not be held responsible for the validity of the ICD and CPT codes included on this report.  AMA assumes no liability for data contained or not contained herein. CPT is a Designer, television/film set of the [employer].

## 2014-07-26 ENCOUNTER — Telehealth: Payer: Self-pay

## 2014-07-26 ENCOUNTER — Ambulatory Visit (HOSPITAL_COMMUNITY): Admission: RE | Admit: 2014-07-26 | Payer: Medicare Other | Source: Ambulatory Visit

## 2014-07-26 DIAGNOSIS — R188 Other ascites: Secondary | ICD-10-CM

## 2014-07-26 LAB — TYPE AND SCREEN
ABO/RH(D): A POS
Antibody Screen: NEGATIVE
Unit division: 0
Unit division: 0
Unit division: 0
Unit division: 0

## 2014-07-26 MED ORDER — LIDOCAINE VISCOUS 2 % MT SOLN: OROMUCOSAL | Status: DC

## 2014-07-26 NOTE — Telephone Encounter (Signed)
Called pt and spoke with her. She is aware of her appt today at [facility] at 1:15 pm

## 2014-07-26 NOTE — Telephone Encounter (Signed)
She is having pain in her abd area and chest. Trying to eat and drink and it hard to swallow. She is getting help to get out of the bed so she does not strain anything. She is having pressure when she does get up. She is just not feeling good. She is not going to be able to have the [place] PARA today. We have reschedule it to Friday @ 2:45. Please advise

## 2014-07-26 NOTE — Telephone Encounter (Signed)
Pt called in today to see if she could have something for pain. Please advise

## 2014-07-26 NOTE — Telephone Encounter (Signed)
Please nic ov's

## 2014-07-26 NOTE — Telephone Encounter (Signed)
Wanda [name] patients boyfriend is aware of what SLF said. HE is also aware of her new appointment time for the [place] PARA on Friday.

## 2014-07-26 NOTE — Telephone Encounter (Signed)
PLEASE CALL PT. SHE SHOULD FOLLOW A FULL LIQUID DIET FOR THE NEXT 3 DAYS: CREAMY SOUPS, MASHED POTATOES, CHICKEN/RICE SOUPS. SHE MAY SE VISCOUS LIDOCAINE 2 TSP QAA AND HS FOR CHEST AND ABDOMINAL PAIN. HER CHEST PAIN MAY BE DUE TO IRRITATION FROM BANDING. EACH SITE HAS AN ULCER WHEN THE BANDS FALL OFF. I PLACED TWO BANDS  TUES BUT IT SHOULD GET BETTER OVER THE NEXT 3 DAYS. HER ABDOMINAL PAIN MAY BE DUE TO TENSE ASCITES. IF THE ABDOMINAL PAIN GETS WORSE SHE SHOULD GO TO THE ED. OTHERWISE SHE SHOULD HAVE A THE PARACENTESIS TOMORROW. CONTINUE THE CIPRO.

## 2014-07-27 ENCOUNTER — Ambulatory Visit (HOSPITAL_COMMUNITY)
Admission: RE | Admit: 2014-07-27 | Discharge: 2014-07-27 | Disposition: A | Discharge status: Home / Self Care | Payer: Medicare Other | Source: Ambulatory Visit | Attending: Gastroenterology | Admitting: Gastroenterology

## 2014-07-27 ENCOUNTER — Encounter (HOSPITAL_COMMUNITY): Payer: Self-pay

## 2014-07-27 ENCOUNTER — Ambulatory Visit (HOSPITAL_COMMUNITY): Payer: Medicare Other

## 2014-07-27 DIAGNOSIS — R188 Other ascites: Secondary | ICD-10-CM | POA: Insufficient documentation

## 2014-07-27 DIAGNOSIS — K7460 Unspecified cirrhosis of liver: Secondary | ICD-10-CM | POA: Insufficient documentation

## 2014-07-27 LAB — BODY FLUID CELL COUNT WITH DIFFERENTIAL
EOS FL: 0 %
Lymphs, Fluid: 17 %
Monocyte-Macrophage-Serous Fluid: 83 % (ref 50–90)
Neutrophil Count, Fluid: 0 % (ref 0–25)
Total Nucleated Cell Count, Fluid: 177 cu mm (ref 0–1000)

## 2014-07-27 NOTE — Procedures (Signed)
PreOperative Dx: Cirrhosis, ascites Postoperative Dx: Cirrhosis, ascites Procedure:   US guided paracentesis Radiologist:  Thornton Papas Anesthesia:  10 ml of 1% lidocaine Specimen:  4300 ml of amber colored ascitic fluid EBL:   < 1 ml Complications: None

## 2014-07-27 NOTE — Progress Notes (Signed)
Paracentesis complete no signs of distress. 4300 ml amber colored ascites removed.

## 2014-07-30 ENCOUNTER — Other Ambulatory Visit: Payer: Self-pay

## 2014-07-30 DIAGNOSIS — D649 Anemia, unspecified: Secondary | ICD-10-CM

## 2014-07-30 NOTE — Telephone Encounter (Signed)
Tried to call and no answer. Mailed lab order to pt with a note to please do around 08/07/2014.

## 2014-08-09 NOTE — Telephone Encounter (Signed)
Reminders in epic °

## 2014-08-10 DIAGNOSIS — N186 End stage renal disease: Secondary | ICD-10-CM

## 2014-08-10 HISTORY — DX: Dependence on renal dialysis: N18.6

## 2014-08-10 LAB — CBC WITH DIFFERENTIAL/PLATELET
BASOS PCT: 1 % (ref 0–1)
Basophils Absolute: 0.1 10*3/uL (ref 0.0–0.1)
EOS PCT: 2 % (ref 0–5)
Eosinophils Absolute: 0.2 10*3/uL (ref 0.0–0.7)
HCT: 24.5 % — ABNORMAL LOW (ref 36.0–46.0)
HEMOGLOBIN: 8.6 g/dL — AB (ref 12.0–15.0)
Lymphocytes Relative: 23 % (ref 12–46)
Lymphs Abs: 2.5 10*3/uL (ref 0.7–4.0)
MCH: 32.5 pg (ref 26.0–34.0)
MCHC: 35.1 g/dL (ref 30.0–36.0)
MCV: 92.5 fL (ref 78.0–100.0)
MONOS PCT: 10 % (ref 3–12)
MPV: 8.5 fL — ABNORMAL LOW (ref 9.4–12.4)
Monocytes Absolute: 1.1 10*3/uL — ABNORMAL HIGH (ref 0.1–1.0)
NEUTROS ABS: 7 10*3/uL (ref 1.7–7.7)
Neutrophils Relative %: 64 % (ref 43–77)
PLATELETS: 215 10*3/uL (ref 150–400)
RBC: 2.65 MIL/uL — ABNORMAL LOW (ref 3.87–5.11)
RDW: 19.7 % — AB (ref 11.5–15.5)
WBC: 10.9 10*3/uL — ABNORMAL HIGH (ref 4.0–10.5)

## 2014-08-13 ENCOUNTER — Inpatient Hospital Stay (HOSPITAL_COMMUNITY)
Admission: EM | Admit: 2014-08-13 | Discharge: 2014-08-28 | DRG: 988 | Disposition: A | Discharge status: Home / Self Care | Payer: Medicare Other | Attending: Internal Medicine | Admitting: Internal Medicine

## 2014-08-13 ENCOUNTER — Encounter (HOSPITAL_COMMUNITY): Payer: Self-pay | Admitting: *Deleted

## 2014-08-13 DIAGNOSIS — E872 Acidosis, unspecified: Secondary | ICD-10-CM | POA: Diagnosis present

## 2014-08-13 DIAGNOSIS — I9589 Other hypotension: Secondary | ICD-10-CM | POA: Diagnosis present

## 2014-08-13 DIAGNOSIS — I8510 Secondary esophageal varices without bleeding: Secondary | ICD-10-CM | POA: Diagnosis present

## 2014-08-13 DIAGNOSIS — R601 Generalized edema: Secondary | ICD-10-CM | POA: Diagnosis present

## 2014-08-13 DIAGNOSIS — D649 Anemia, unspecified: Secondary | ICD-10-CM | POA: Diagnosis present

## 2014-08-13 DIAGNOSIS — N19 Unspecified kidney failure: Secondary | ICD-10-CM | POA: Diagnosis present

## 2014-08-13 DIAGNOSIS — Z87891 Personal history of nicotine dependence: Secondary | ICD-10-CM

## 2014-08-13 DIAGNOSIS — E871 Hypo-osmolality and hyponatremia: Secondary | ICD-10-CM | POA: Diagnosis present

## 2014-08-13 DIAGNOSIS — K766 Portal hypertension: Secondary | ICD-10-CM | POA: Diagnosis present

## 2014-08-13 DIAGNOSIS — F319 Bipolar disorder, unspecified: Secondary | ICD-10-CM | POA: Diagnosis present

## 2014-08-13 DIAGNOSIS — D638 Anemia in other chronic diseases classified elsewhere: Secondary | ICD-10-CM | POA: Diagnosis present

## 2014-08-13 DIAGNOSIS — K429 Umbilical hernia without obstruction or gangrene: Secondary | ICD-10-CM | POA: Diagnosis present

## 2014-08-13 DIAGNOSIS — K219 Gastro-esophageal reflux disease without esophagitis: Secondary | ICD-10-CM | POA: Diagnosis present

## 2014-08-13 DIAGNOSIS — Z8249 Family history of ischemic heart disease and other diseases of the circulatory system: Secondary | ICD-10-CM | POA: Diagnosis not present

## 2014-08-13 DIAGNOSIS — R188 Other ascites: Secondary | ICD-10-CM

## 2014-08-13 DIAGNOSIS — N179 Acute kidney failure, unspecified: Secondary | ICD-10-CM | POA: Diagnosis present

## 2014-08-13 DIAGNOSIS — Z7682 Awaiting organ transplant status: Secondary | ICD-10-CM

## 2014-08-13 DIAGNOSIS — D696 Thrombocytopenia, unspecified: Secondary | ICD-10-CM

## 2014-08-13 DIAGNOSIS — K7581 Nonalcoholic steatohepatitis (NASH): Secondary | ICD-10-CM | POA: Diagnosis present

## 2014-08-13 DIAGNOSIS — K7460 Unspecified cirrhosis of liver: Secondary | ICD-10-CM | POA: Diagnosis not present

## 2014-08-13 DIAGNOSIS — N183 Chronic kidney disease, stage 3 (moderate): Secondary | ICD-10-CM | POA: Diagnosis present

## 2014-08-13 DIAGNOSIS — Z992 Dependence on renal dialysis: Secondary | ICD-10-CM | POA: Diagnosis not present

## 2014-08-13 DIAGNOSIS — T829XXA Unspecified complication of cardiac and vascular prosthetic device, implant and graft, initial encounter: Secondary | ICD-10-CM

## 2014-08-13 LAB — COMPREHENSIVE METABOLIC PANEL
ALT: 15 U/L (ref 0–35)
ANION GAP: 12 (ref 5–15)
AST: 59 U/L — ABNORMAL HIGH (ref 0–37)
Albumin: 2.8 g/dL — ABNORMAL LOW (ref 3.5–5.2)
Alkaline Phosphatase: 95 U/L (ref 39–117)
BILIRUBIN TOTAL: 1.9 mg/dL — AB (ref 0.3–1.2)
BUN: 71 mg/dL — ABNORMAL HIGH (ref 6–23)
CHLORIDE: 107 meq/L (ref 96–112)
CO2: 17 mmol/L — AB (ref 19–32)
CREATININE: 4.89 mg/dL — AB (ref 0.50–1.10)
Calcium: 8.1 mg/dL — ABNORMAL LOW (ref 8.4–10.5)
GFR, EST AFRICAN AMERICAN: 12 mL/min — AB (ref >90)
GFR, EST NON AFRICAN AMERICAN: 10 mL/min — AB (ref >90)
GLUCOSE: 111 mg/dL — AB (ref 70–99)
Potassium: 4.3 mmol/L (ref 3.5–5.1)
Sodium: 136 mmol/L (ref 135–145)
Total Protein: 7.5 g/dL (ref 6.0–8.3)

## 2014-08-13 LAB — CBC WITH DIFFERENTIAL/PLATELET
BASOS PCT: 1 % (ref 0–1)
Basophils Absolute: 0.1 10*3/uL (ref 0.0–0.1)
Eosinophils Absolute: 0.1 10*3/uL (ref 0.0–0.7)
Eosinophils Relative: 2 % (ref 0–5)
HCT: 24.8 % — ABNORMAL LOW (ref 36.0–46.0)
Hemoglobin: 8.6 g/dL — ABNORMAL LOW (ref 12.0–15.0)
LYMPHS ABS: 1.7 10*3/uL (ref 0.7–4.0)
Lymphocytes Relative: 22 % (ref 12–46)
MCH: 33.7 pg (ref 26.0–34.0)
MCHC: 34.7 g/dL (ref 30.0–36.0)
MCV: 97.3 fL (ref 78.0–100.0)
Monocytes Absolute: 0.5 10*3/uL (ref 0.1–1.0)
Monocytes Relative: 7 % (ref 3–12)
NEUTROS ABS: 5.4 10*3/uL (ref 1.7–7.7)
NEUTROS PCT: 69 % (ref 43–77)
Platelets: 184 10*3/uL (ref 150–400)
RBC: 2.55 MIL/uL — AB (ref 3.87–5.11)
RDW: 21.9 % — ABNORMAL HIGH (ref 11.5–15.5)
WBC: 7.8 10*3/uL (ref 4.0–10.5)

## 2014-08-13 LAB — URINALYSIS, ROUTINE W REFLEX MICROSCOPIC
BILIRUBIN URINE: NEGATIVE
Glucose, UA: NEGATIVE mg/dL
Hgb urine dipstick: NEGATIVE
KETONES UR: NEGATIVE mg/dL
Leukocytes, UA: NEGATIVE
Nitrite: NEGATIVE
PROTEIN: NEGATIVE mg/dL
Specific Gravity, Urine: 1.025 (ref 1.005–1.030)
UROBILINOGEN UA: 0.2 mg/dL (ref 0.0–1.0)
pH: 5 (ref 5.0–8.0)

## 2014-08-13 MED ORDER — MIDODRINE HCL 5 MG PO TABS
10.0000 mg | ORAL_TABLET | Freq: Two times a day (BID) | ORAL | Status: DC
  Administered 2014-08-14 – 2014-08-16 (×6): 10 mg via ORAL
  Filled 2014-08-13 (×7): qty 2

## 2014-08-13 MED ORDER — PROPRANOLOL HCL 20 MG PO TABS
20.0000 mg | ORAL_TABLET | Freq: Two times a day (BID) | ORAL | Status: DC
  Administered 2014-08-13 – 2014-08-14 (×2): 20 mg via ORAL
  Filled 2014-08-13 (×4): qty 1

## 2014-08-13 MED ORDER — PANTOPRAZOLE SODIUM 40 MG PO TBEC
40.0000 mg | DELAYED_RELEASE_TABLET | Freq: Every day | ORAL | Status: DC
  Administered 2014-08-14 – 2014-08-28 (×14): 40 mg via ORAL
  Filled 2014-08-13 (×15): qty 1

## 2014-08-13 MED ORDER — OXYCODONE HCL 5 MG PO TABS
10.0000 mg | ORAL_TABLET | Freq: Four times a day (QID) | ORAL | Status: DC | PRN
  Administered 2014-08-13 – 2014-08-24 (×40): 10 mg via ORAL
  Filled 2014-08-13 (×41): qty 2

## 2014-08-13 MED ORDER — SODIUM CHLORIDE 0.9 % IJ SOLN
3.0000 mL | Freq: Two times a day (BID) | INTRAMUSCULAR | Status: DC
  Administered 2014-08-14 – 2014-08-28 (×22): 3 mL via INTRAVENOUS

## 2014-08-13 MED ORDER — ONDANSETRON HCL 4 MG/2ML IJ SOLN
4.0000 mg | Freq: Three times a day (TID) | INTRAMUSCULAR | Status: AC | PRN
  Filled 2014-08-13: qty 2

## 2014-08-13 MED ORDER — SODIUM CHLORIDE 0.9 % IV SOLN: INTRAVENOUS | Status: DC

## 2014-08-13 MED ORDER — SODIUM CHLORIDE 0.9 % IV SOLN
Freq: Once | INTRAVENOUS | Status: AC
  Administered 2014-08-13: 22:00:00 via INTRAVENOUS

## 2014-08-13 MED ORDER — SODIUM CHLORIDE 0.9 % IV SOLN
INTRAVENOUS | Status: AC
  Administered 2014-08-13 – 2014-08-14 (×2): via INTRAVENOUS

## 2014-08-13 NOTE — ED Provider Notes (Signed)
CSN: [id number]     Arrival date & time 08/13/14  2011 History  This chart was scribed for Johnna Acosta, MD by Rayfield Citizen, ED Scribe. This patient was seen in room APA05/[facility]05 and the patient's care was started at 9:08 PM.    Chief Complaint  Patient presents with  . sent by physician    The history is provided by the patient. No language interpreter was used.     HPI Comments: Wanda [name] is a 39 y.o. 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 Patient reports that she was having blood work done at [facility] today to evaluate transplant list options; she was told her kidneys were failing and that she needed to be evaluated in the ED. She states that she regularly experiences belly pain. She reports slightly decreased urine (urinated approx. 3x today) and occasional dysuria. She denies fever, chills.   She was recently taking an antibiotic for a recent "bacterial infection" (unable to specify the infection). Patient recently had esophageal banding for varices (07/04/14 and 07/24/14).   [facility] is managed [facility].  According to [facility], lab work today showed: creatinine 5, BUN 66, hemoglobin 8.7, INR 1.1.  Past Medical History  Diagnosis Date  . GERD (gastroesophageal reflux disease)   . 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 c/w steatohepatitis  . Folate deficiency 09/2013  . Anasarca 10/10/2013  . Hematemesis/vomiting blood 02/24/2014  . Acute blood loss anemia 02/25/2014    Status post transfusion  . Macrocytosis 02/28/2014  . 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 Acute renal failure 09/2013    Pre-renal- resolved  . C. difficile colitis 04/19/2014  . Fatty liver disease, nonalcoholic 8502   Past Surgical History  Procedure Laterality Date  . None    . Paracentesis  Feb 2015    1180 fluid,  negative fluid analysis.   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 Paracentesis  10/2013  . Colonoscopy N/A 12/19/2013    SLF:NO OBVIOUS SOURCE FOR ANEMIA IDETIFIED/ONE COLON POLYP REMOVED/Small internal hemorrhoids  . Esophagogastroduodenoscopy N/A 02/11/2014    Dr. Rourk:Esophageal varices with bleeding stigmata-status post esophageal band ligation therapy. Portal gastropathy  . Esophagogastroduodenoscopy N/A 07/04/2014    Procedure: ESOPHAGOGASTRODUODENOSCOPY (EGD);  Surgeon: Daneil Dolin, MD;  [facility];  Service: Endoscopy;  Laterality: N/A;  945  . Esophageal banding  07/04/2014    Procedure: ESOPHAGEAL BANDING;  Surgeon: Daneil Dolin, MD;  Location: [facility];  Service: Endoscopy;;  . Esophagogastroduodenoscopy (egd) with propofol N/A 07/24/2014    Procedure: ESOPHAGOGASTRODUODENOSCOPY (EGD) WITH PROPOFOL;  Surgeon: Danie Binder, MD;  Location: [facility];  Service: Endoscopy;  Laterality: N/A;  . Esophageal banding N/A 07/24/2014    Procedure: ESOPHAGEAL BANDING (2 bands applied);  Surgeon: Danie Binder, MD;  Location: [facility];  Service: Endoscopy;  Laterality: N/A;   Family History  Problem Relation Age of Onset  . Heart disease Mother   . Colon cancer Neg Hx   . Liver disease Neg Hx    History  Substance Use Topics  . Smoking status: Former Smoker -- 0.25 packs/day for 20 years    Types: Cigarettes    Quit date: 11/05/2008  .  Smokeless tobacco: Never Used  . Alcohol Use: No   OB History    Gravida Para Term Preterm AB TAB SAB Ectopic Multiple Living   1 1 1       1      Review of Systems  All other systems reviewed and are negative.  Allergies  Lasix  Home Medications   Prior to Admission medications   Medication Sig Start Date End Date Taking? Authorizing Provider  ciprofloxacin (CIPRO) 500 MG tablet 1 po bid  for 7 days 07/24/14   Danie Binder, MD  lidocaine (XYLOCAINE) 2 % solution 2 TSP  PO QAC AND HS TO PREVENT FOR CHEST PAIN WHILE EATING 07/26/14   Danie Binder, MD  loperamide (IMODIUM) 2 MG capsule Take 1 capsule (2 mg total) by mouth 2 (two) times daily as needed for diarrhea or loose stools. 05/18/14   Maryann Mikhail, DO  loratadine (CLARITIN) 10 MG tablet Take 1 tablet (10 mg total) by mouth daily. Patient taking differently: Take 10 mg by mouth daily as needed for allergies.  04/27/14   Rexene Alberts, MD  magnesium oxide (MAG-OX) 400 (241.3 MG) MG tablet Take 1 tablet (400 mg total) by mouth daily. 04/27/14   Rexene Alberts, MD  midodrine (PROAMATINE) 10 MG tablet Take 1 tablet (10 mg total) by mouth 2 (two) times daily with a meal. Medication to keep your blood pressure from falling. 04/27/14   Rexene Alberts, MD  oxyCODONE 10 MG TABS Take 1 tablet (10 mg total) by mouth every 6 (six) hours as needed for moderate pain. 05/18/14   Maryann Mikhail, DO  oxyCODONE 10 MG TABS Take 1 tablet (10 mg total) by mouth every 6 (six) hours as needed for moderate pain. 07/07/14   Donne Hazel, MD  pantoprazole (PROTONIX) 40 MG tablet Take 1 tablet (40 mg total) by mouth daily. 07/07/14   Donne Hazel, MD  potassium chloride (MICRO-K) 10 MEQ CR capsule Take 10 mEq by mouth 3 (three) times daily. 03/02/14   Historical Provider, MD  propranolol (INDERAL) 20 MG tablet Take 1 tablet (20 mg total) by mouth 2 (two) times daily. 07/07/14   Donne Hazel, MD  spironolactone (ALDACTONE) 50 MG tablet Start this 50 mg tablet 1 time daily after you finish your 25 mg tablets. Patient taking differently: Take 50 mg by mouth daily.  04/27/14   Rexene Alberts, MD  torsemide (DEMADEX) 10 MG tablet Take 3 tablets (30 mg total) by mouth daily. 04/27/14   Rexene Alberts, MD   BP 123/72 mmHg  Pulse 97  Temp(Src) 98 F (36.7 C) (Oral)  Resp 20  Ht 5\' 2"  (1.575 m)  Wt 125 lb (56.7 kg)  BMI 22.86 kg/m2  SpO2 100% Physical Exam   Constitutional: She appears well-developed and well-nourished.  HENT:  Head: Normocephalic and atraumatic.  Eyes: Right eye exhibits no discharge. Left eye exhibits no discharge.  Mild jaundice   Pulmonary/Chest: Effort normal. No respiratory distress.  Abdominal: Soft. Bowel sounds are normal. She exhibits no distension. There is tenderness. There is no rebound and no guarding.  Minimal diffuse tenderness; nonfocal, soft and nondistended without guarding. No CVA tenderness.   Musculoskeletal:  1+ pitting edema bilaterally below the knees  Neurological: She is alert. Coordination normal.  Skin: Skin is warm and dry. No rash noted. She is not diaphoretic. No erythema.  Psychiatric: She has a normal mood and affect.  Nursing note and vitals reviewed.   ED Course  Procedures  DIAGNOSTIC STUDIES: Oxygen Saturation is 100% on RA, normal by my interpretation.    COORDINATION OF CARE: 9:20 PM Discussed treatment plan with pt at bedside and pt agreed to plan.   Labs Review Labs Reviewed  CBC WITH DIFFERENTIAL - Abnormal; Notable for the following:    RBC 2.55 (*)    Hemoglobin 8.6 (*)    HCT 24.8 (*)    RDW 21.9 (*)    All other components within normal limits  COMPREHENSIVE METABOLIC PANEL  URINALYSIS, ROUTINE W REFLEX MICROSCOPIC    Imaging Review No results found.    MDM   Final diagnoses:  Renal failure    Labs reviewed in care everywhere from earlier today, this shows creatinine of 5, BUNs of 66, she appears to be in acute renal failure from an unknown cause, she does comment on some difficulty urinating, urinalysis pending, otherwise vital signs are unremarkable. She will need to be admitted to the hospital for further care, I suspect this is related to diuresis and dehydration, possibly postobstructive, discussed with Dr. Maudie Mercury who will admit.  Meds given in ED:  Medications  0.9 %  sodium chloride infusion ( Intravenous New Bag/Given 08/13/14 2133)    New  Prescriptions   No medications on file    I personally performed the services described in this documentation, which was scribed in my presence. The recorded information has been reviewed and is accurate.     Johnna Acosta, MD 08/13/14 2150

## 2014-08-13 NOTE — ED Notes (Signed)
Pt states she was seen at [facility] today and had some labs done, the doctors called her back and stated her kidneys were failing and she needed to come to ED to be seen

## 2014-08-14 ENCOUNTER — Encounter (HOSPITAL_COMMUNITY): Payer: Self-pay | Admitting: Internal Medicine

## 2014-08-14 ENCOUNTER — Inpatient Hospital Stay (HOSPITAL_COMMUNITY): Payer: Medicare Other

## 2014-08-14 DIAGNOSIS — E872 Acidosis, unspecified: Secondary | ICD-10-CM | POA: Diagnosis present

## 2014-08-14 DIAGNOSIS — R601 Generalized edema: Secondary | ICD-10-CM

## 2014-08-14 DIAGNOSIS — D649 Anemia, unspecified: Secondary | ICD-10-CM

## 2014-08-14 DIAGNOSIS — N179 Acute kidney failure, unspecified: Principal | ICD-10-CM

## 2014-08-14 DIAGNOSIS — K7581 Nonalcoholic steatohepatitis (NASH): Secondary | ICD-10-CM

## 2014-08-14 LAB — COMPREHENSIVE METABOLIC PANEL
ALT: 13 U/L (ref 0–35)
AST: 50 U/L — AB (ref 0–37)
Albumin: 2.5 g/dL — ABNORMAL LOW (ref 3.5–5.2)
Alkaline Phosphatase: 85 U/L (ref 39–117)
Anion gap: 12 (ref 5–15)
BUN: 67 mg/dL — ABNORMAL HIGH (ref 6–23)
CALCIUM: 7.4 mg/dL — AB (ref 8.4–10.5)
CO2: 15 mmol/L — ABNORMAL LOW (ref 19–32)
CREATININE: 4.53 mg/dL — AB (ref 0.50–1.10)
Chloride: 109 mEq/L (ref 96–112)
GFR calc Af Amer: 13 mL/min — ABNORMAL LOW (ref >90)
GFR calc non Af Amer: 11 mL/min — ABNORMAL LOW (ref >90)
Glucose, Bld: 127 mg/dL — ABNORMAL HIGH (ref 70–99)
Potassium: 3.8 mmol/L (ref 3.5–5.1)
Sodium: 136 mmol/L (ref 135–145)
Total Bilirubin: 1.6 mg/dL — ABNORMAL HIGH (ref 0.3–1.2)
Total Protein: 6.6 g/dL (ref 6.0–8.3)

## 2014-08-14 LAB — LACTIC ACID, PLASMA: LACTIC ACID, VENOUS: 1.7 mmol/L (ref 0.5–2.2)

## 2014-08-14 MED ORDER — CEFTRIAXONE SODIUM IN DEXTROSE 20 MG/ML IV SOLN
1.0000 g | Freq: Every evening | INTRAVENOUS | Status: DC | PRN
  Filled 2014-08-14: qty 50

## 2014-08-14 MED ORDER — ONDANSETRON HCL 4 MG/2ML IJ SOLN
4.0000 mg | Freq: Four times a day (QID) | INTRAMUSCULAR | Status: DC | PRN
  Administered 2014-08-14 – 2014-08-17 (×7): 4 mg via INTRAVENOUS
  Filled 2014-08-14 (×7): qty 2

## 2014-08-14 MED ORDER — CEFTRIAXONE SODIUM IN DEXTROSE 20 MG/ML IV SOLN
1.0000 g | Freq: Every day | INTRAVENOUS | Status: DC
  Administered 2014-08-14 – 2014-08-15 (×3): 1 g via INTRAVENOUS
  Filled 2014-08-14 (×4): qty 50

## 2014-08-14 MED ORDER — BUMETANIDE 0.25 MG/ML IJ SOLN
0.5000 mg | Freq: Two times a day (BID) | INTRAMUSCULAR | Status: DC
  Administered 2014-08-14 – 2014-08-15 (×3): 0.5 mg via INTRAVENOUS
  Filled 2014-08-14 (×4): qty 4

## 2014-08-14 MED ORDER — DEXTROSE 5 % IV SOLN
INTRAVENOUS | Status: AC
  Filled 2014-08-14: qty 10

## 2014-08-14 MED ORDER — SODIUM BICARBONATE 650 MG PO TABS
650.0000 mg | ORAL_TABLET | Freq: Two times a day (BID) | ORAL | Status: DC
  Administered 2014-08-14 – 2014-08-16 (×4): 650 mg via ORAL
  Filled 2014-08-14 (×4): qty 1

## 2014-08-14 NOTE — Consult Note (Addendum)
Reason for Consult: Acute kidney injury superimposed on chronic Referring Physician: Dr. Beather Arbour Wanda [name] is an 39 y.o. female.  HPI: She is a patient who has history of liver cirrhosis, recurrent acute kidney injury, chronic renal failure stage III and history of bipolar disorder presently came after she was found to have significantly elevated BUN and creatinine. According to the patient she had a blood work done at [facility] for possible liver transplant. Since her BUN and creatinine has increased significantly and she was told to come to the emergency room. Presently patient states that she is some nausea but no vomiting. She has some stuffiness of her nose but no difficulty breathing. According to the patient she was given Cipro about a week ago for some sort of infection which she is not doubly sure. She has paracentesis a couple of weeks ago. Presently she denies any fever or chills or sweating.  Past Medical History  Diagnosis Date  . GERD (gastroesophageal reflux disease)   . 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 c/w steatohepatitis  . Folate deficiency 09/2013  . Anasarca 10/10/2013  . Hematemesis/vomiting blood 02/24/2014  . Acute blood loss anemia 02/25/2014    Status post transfusion  . Macrocytosis 02/28/2014  . 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 Acute renal failure 09/2013    Pre-renal- resolved  . C. difficile colitis 04/19/2014  . Fatty liver disease, nonalcoholic 1937    Past Surgical History  Procedure Laterality Date  . None    . Paracentesis  Feb 2015    1180 fluid, negative fluid analysis.   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 Paracentesis  10/2013  . Colonoscopy N/A 12/19/2013    SLF:NO OBVIOUS SOURCE FOR ANEMIA IDETIFIED/ONE COLON POLYP REMOVED/Small  internal hemorrhoids  . Esophagogastroduodenoscopy N/A 02/11/2014    Dr. Rourk:Esophageal varices with bleeding stigmata-status post esophageal band ligation therapy. Portal gastropathy  . Esophagogastroduodenoscopy N/A 07/04/2014    Procedure: ESOPHAGOGASTRODUODENOSCOPY (EGD);  Surgeon: Daneil Dolin, MD;  [facility];  Service: Endoscopy;  Laterality: N/A;  945  . Esophageal banding  07/04/2014    Procedure: ESOPHAGEAL BANDING;  Surgeon: Daneil Dolin, MD;  [facility];  Service: Endoscopy;;  . Esophagogastroduodenoscopy (egd) with propofol N/A 07/24/2014    Procedure: ESOPHAGOGASTRODUODENOSCOPY (EGD) WITH PROPOFOL;  Surgeon: Danie Binder, MD;  Location: [facility];  Service: Endoscopy;  Laterality: N/A;  . Esophageal banding N/A 07/24/2014    Procedure: ESOPHAGEAL BANDING (2 bands applied);  Surgeon: Danie Binder, MD;  Location: [facility];  Service: Endoscopy;  Laterality: N/A;    Family History  Problem Relation Age of Onset  . Heart disease Mother   . Colon cancer Neg Hx   . Liver disease Neg Hx     Social History:  reports that she quit smoking about 5 years ago. Her smoking use included Cigarettes. She has a 5 pack-year smoking history. She has never used smokeless tobacco. She reports that she does not drink alcohol or use illicit drugs.  Allergies:  Allergies  Allergen Reactions  . Lasix [Furosemide]     "doesn't work"    Medications: I have reviewed the patient's current medications.  Results for orders placed or performed during the hospital encounter of 08/13/14 (from the past 48 hour(s))  Comprehensive  metabolic panel     Status: Abnormal   Collection Time: 08/13/14  9:21 PM  Result Value Ref Range   Sodium 136 135 - 145 mmol/L    Comment: Please note change in reference range.   Potassium 4.3 3.5 - 5.1 mmol/L    Comment: Please note change in reference range. DELTA CHECK NOTED    Chloride 107 96 - 112 mEq/L   CO2 17 (L) 19 - 32 mmol/L    Glucose, Bld 111 (H) 70 - 99 mg/dL   BUN 71 (H) 6 - 23 mg/dL   Creatinine, Ser 4.89 (H) 0.50 - 1.10 mg/dL   Calcium 8.1 (L) 8.4 - 10.5 mg/dL   Total Protein 7.5 6.0 - 8.3 g/dL   Albumin 2.8 (L) 3.5 - 5.2 g/dL   AST 59 (H) 0 - 37 U/L   ALT 15 0 - 35 U/L   Alkaline Phosphatase 95 39 - 117 U/L   Total Bilirubin 1.9 (H) 0.3 - 1.2 mg/dL   GFR calc non Af Amer 10 (L) >90 mL/min   GFR calc Af Amer 12 (L) >90 mL/min    Comment: (NOTE) The eGFR has been calculated using the CKD EPI equation. This calculation has not been validated in all clinical situations. eGFR's persistently <90 mL/min signify possible Chronic Kidney Disease.    Anion gap 12 5 - 15  CBC with Differential     Status: Abnormal   Collection Time: 08/13/14  9:21 PM  Result Value Ref Range   WBC 7.8 4.0 - 10.5 K/uL   RBC 2.55 (L) 3.87 - 5.11 MIL/uL   Hemoglobin 8.6 (L) 12.0 - 15.0 g/dL   HCT 24.8 (L) 36.0 - 46.0 %   MCV 97.3 78.0 - 100.0 fL   MCH 33.7 26.0 - 34.0 pg   MCHC 34.7 30.0 - 36.0 g/dL   RDW 21.9 (H) 11.5 - 15.5 %   Platelets 184 150 - 400 K/uL   Neutrophils Relative % 69 43 - 77 %   Neutro Abs 5.4 1.7 - 7.7 K/uL   Lymphocytes Relative 22 12 - 46 %   Lymphs Abs 1.7 0.7 - 4.0 K/uL   Monocytes Relative 7 3 - 12 %   Monocytes Absolute 0.5 0.1 - 1.0 K/uL   Eosinophils Relative 2 0 - 5 %   Eosinophils Absolute 0.1 0.0 - 0.7 K/uL   Basophils Relative 1 0 - 1 %   Basophils Absolute 0.1 0.0 - 0.1 K/uL  Urinalysis, Routine w reflex microscopic     Status: None   Collection Time: 08/13/14  9:38 PM  Result Value Ref Range   Color, Urine YELLOW YELLOW   APPearance CLEAR CLEAR   Specific Gravity, Urine 1.025 1.005 - 1.030   pH 5.0 5.0 - 8.0   Glucose, UA NEGATIVE NEGATIVE mg/dL   Hgb urine dipstick NEGATIVE NEGATIVE   Bilirubin Urine NEGATIVE NEGATIVE   Ketones, ur NEGATIVE NEGATIVE mg/dL   Protein, ur NEGATIVE NEGATIVE mg/dL   Urobilinogen, UA 0.2 0.0 - 1.0 mg/dL   Nitrite NEGATIVE NEGATIVE   Leukocytes, UA  NEGATIVE NEGATIVE    Comment: MICROSCOPIC NOT DONE ON URINES WITH NEGATIVE PROTEIN, BLOOD, LEUKOCYTES, NITRITE, OR GLUCOSE <1000 mg/dL.  Comprehensive metabolic panel     Status: Abnormal   Collection Time: 08/14/14  9:26 AM  Result Value Ref Range   Sodium 136 135 - 145 mmol/L    Comment: Please note change in reference range.   Potassium 3.8 3.5 - 5.1 mmol/L  Comment: Please note change in reference range.   Chloride 109 96 - 112 mEq/L   CO2 15 (L) 19 - 32 mmol/L   Glucose, Bld 127 (H) 70 - 99 mg/dL   BUN 67 (H) 6 - 23 mg/dL   Creatinine, Ser 4.53 (H) 0.50 - 1.10 mg/dL   Calcium 7.4 (L) 8.4 - 10.5 mg/dL   Total Protein 6.6 6.0 - 8.3 g/dL   Albumin 2.5 (L) 3.5 - 5.2 g/dL   AST 50 (H) 0 - 37 U/L   ALT 13 0 - 35 U/L   Alkaline Phosphatase 85 39 - 117 U/L   Total Bilirubin 1.6 (H) 0.3 - 1.2 mg/dL   GFR calc non Af Amer 11 (L) >90 mL/min   GFR calc Af Amer 13 (L) >90 mL/min    Comment: (NOTE) The eGFR has been calculated using the CKD EPI equation. This calculation has not been validated in all clinical situations. eGFR's persistently <90 mL/min signify possible Chronic Kidney Disease.    Anion gap 12 5 - 15    US Renal  08/14/2014   CLINICAL DATA:  39 year old with hepatic cirrhosis and portal hypertension, presenting with acute renal failure.  EXAM: RENAL/URINARY TRACT ULTRASOUND COMPLETE  COMPARISON:  No prior urinary tract ultrasound. CT abdomen and pelvis 09/11/2013.  FINDINGS: Right Kidney:  Length: Approximately 11.0 cm. Echogenic parenchyma. Well-preserved cortex. No focal parenchymal abnormality. No hydronephrosis.  Left Kidney:  Length: Approximately 10.1 cm. Echogenic parenchyma. Well-preserved cortex. No focal parenchymal abnormality. No hydronephrosis.  Bladder:  Likely decompressed, as it was not clearly visualized in the pelvis.  Other findings:  Large amount of pelvic ascites.  IMPRESSION: 1. No evidence of hydronephrosis involving either kidney. 2. Echogenic renal  parenchyma bilaterally consistent with medical renal disease. 3. Large amount of pelvic ascites.   Electronically Signed   By: Evangeline Dakin M.D.   On: 08/14/2014 15:46    Review of Systems  Constitutional: Positive for malaise/fatigue.  HENT: Positive for congestion.   Respiratory: Negative for cough and shortness of breath.   Cardiovascular: Positive for leg swelling. Negative for orthopnea and PND.  Gastrointestinal: Positive for nausea. Negative for vomiting and abdominal pain.   Blood pressure 85/39, pulse 73, temperature 97.8 F (36.6 C), temperature source Oral, resp. rate 20, height 5' 2"  (1.575 m), weight 54.3 kg (119 lb 11.4 oz), SpO2 100 %. Physical Exam  Constitutional: No distress.  HENT:  Mouth/Throat: No oropharyngeal exudate.  Neck: JVD present.  Cardiovascular: Normal rate and regular rhythm.   Respiratory: No respiratory distress. She has no wheezes. She has no rales.  GI: She exhibits distension. There is no tenderness. There is no rebound.  Musculoskeletal: She exhibits edema.    Assessment/Plan: Problem #1 acute kidney injury: Possibly multifactorial including prerenal syndrome/ATN/interstitial nephritis.. Presently her BUN/creatinine is slightly better. Problem #2 chronic renal failure: [place] to be  secondary to recurrent acute kidney injury/ischemia. Patient with baseline creatinine of 1.5-1.7. 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 Patient with recurrent ascites requiring paracentesis. She has presently some edema. Problem #4 hypotension: Possibly secondary to the above Problem #5 history of bipolar disorder Problem #6 proteinuria Problem #7 anemia: Her hemoglobin and hematocrit is low Problem #8 metabolic acidosis Plan: continiue with hydraiton We will start patient on Bumex .5 mg iv bid We will check iron studies/BMP/Phosphorus in am  [facility] S 08/14/2014, 4:54 PM

## 2014-08-14 NOTE — Care Management Note (Addendum)
    Page 1 of 2   08/28/2014     2:24:51 PM CARE MANAGEMENT NOTE 08/28/2014  Patient:  Wanda [name], Wanda [name]   Account Number:  [id number]  Date Initiated:  08/14/2014  Documentation initiated by:  Theophilus Kinds  Subjective/Objective Assessment:   Pt admitted from home with ARF. Pt lives with her husband and will return home at discharge. Pt PCP is with the [facility]. Pt has a walker for home use.     Action/Plan:   No CM needs noted.   Anticipated DC Date:  08/17/2014   Anticipated DC Plan:  HOME/SELF CARE  In-house referral  Clinical Social Worker      DC Planning Services  CM consult      Choice offered to / List presented to:             Status of service:  Completed, signed off Medicare Important Message given?  YES (If response is "NO", the following Medicare IM given date fields will be blank) Date Medicare IM given:  08/17/2014 Medicare IM given by:  Christinia Gully C Date Additional Medicare IM given:  08/28/2014 Additional Medicare IM given by:  Theophilus Kinds  Discharge Disposition:  HOME/SELF CARE  Per UR Regulation:    If discussed at Long Length of Stay Meetings, dates discussed:   08/21/2014  08/23/2014  08/28/2014    Comments:  08/28/14 1420 Christinia Gully, RN BSN CM Pt discharged home today. CSW has arranged outpt dialysis for pt and made pt aware of appt. Pt refuses any HH at this time. Pt has a walker for home use. No other CM needs noted.  08/24/14 [place], RN BSN CM Pt platelets low. MD anticipate pt being in hospital over the weekend as pt may need more plts and even IVIG. Will continue to follow for discharge planning needs.  08/23/14 [place], RN BSN CM Pt plts low and pt still oozing blood from dialysis cath site. May need another plt transfusion. Will continue to follow for discharge planning needs. 08/21/14 [place], RN BSN CM Pt has dialysis chair. MD aware.  08/17/14 [place], RN BSN  CM Pt having tunneled dialysis catheter placed on 08/18/14 and initiate dialysis. Will followup with pt on 08/20/14. CSW is aware that pt might need arrangements made for chronic dialysis.  08/14/14 [place], RN BSN CM

## 2014-08-14 NOTE — H&P (Signed)
Wanda [name] is an 39 y.o. female.   Pcp: unassigned   Chief Complaint: ARF HPI: 39 yo female with hx of [name], apparently seen at [facility] earlier today and told to go to ER due to acute renal failure with creatinine of 5.  Pt presented to ED.  Pt notes that slightly decreased urine output.  Denies any recent medication changes.  Pt denies nsaid use.  Pt was noted to have anemia and ARF.  Pt will be admitted for ARF.   Past Medical History  Diagnosis Date  . GERD (gastroesophageal reflux disease)   . 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 c/w steatohepatitis  . Folate deficiency 09/2013  . Anasarca 10/10/2013  . Hematemesis/vomiting blood 02/24/2014  . Acute blood loss anemia 02/25/2014    Status post transfusion  . Macrocytosis 02/28/2014  . 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 Acute renal failure 09/2013    Pre-renal- resolved  . C. difficile colitis 04/19/2014  . Fatty liver disease, nonalcoholic 5400    Past Surgical History  Procedure Laterality Date  . None    . Paracentesis  Feb 2015    1180 fluid, negative fluid analysis.   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 Paracentesis  10/2013  . Colonoscopy N/A 12/19/2013    SLF:NO OBVIOUS SOURCE FOR ANEMIA IDETIFIED/ONE COLON POLYP REMOVED/Small internal hemorrhoids  . Esophagogastroduodenoscopy N/A 02/11/2014    Dr. Rourk:Esophageal varices with bleeding stigmata-status post esophageal band ligation therapy. Portal gastropathy  . Esophagogastroduodenoscopy N/A 07/04/2014    Procedure: ESOPHAGOGASTRODUODENOSCOPY (EGD);  Surgeon: Daneil Dolin, MD;  Location: [facility];  Service: Endoscopy;  Laterality: N/A;  945  . Esophageal banding  07/04/2014    Procedure: ESOPHAGEAL BANDING;  Surgeon: Daneil Dolin, MD;  Location: [facility];   Service: Endoscopy;;  . Esophagogastroduodenoscopy (egd) with propofol N/A 07/24/2014    Procedure: ESOPHAGOGASTRODUODENOSCOPY (EGD) WITH PROPOFOL;  Surgeon: Danie Binder, MD;  Location: [facility] ORS;  Service: Endoscopy;  Laterality: N/A;  . Esophageal banding N/A 07/24/2014    Procedure: ESOPHAGEAL BANDING (2 bands applied);  Surgeon: Danie Binder, MD;  Location: [facility] OR[facility];  Service: Endoscopy;  Laterality: N/A;    Family History  Problem Relation Age of Onset  . Heart disease Mother   . Colon cancer Neg Hx   . Liver disease Neg Hx    Social History:  reports that she quit smoking about 5 years ago. Her smoking use included Cigarettes. She has a 5 pack-year smoking history. She has never used smokeless tobacco. She reports that she does not drink alcohol or use illicit drugs.  Allergies:  Allergies  Allergen Reactions  . Lasix [Furosemide]     "doesn't work"    Medications Prior to Admission  Medication Sig Dispense Refill  . lidocaine (XYLOCAINE) 2 % solution 2 TSP  PO QAC AND HS TO PREVENT FOR CHEST PAIN WHILE EATING (Patient taking differently: Use as directed 10 mLs in the mouth or throat See admin instructions. 2 TSP  PO QAC AND HS TO PREVENT FOR CHEST PAIN WHILE EATING) 500 mL 1  . loperamide (IMODIUM) 2 MG capsule Take 1 capsule (2 mg total) by mouth 2 (two) times daily as needed for diarrhea or loose stools. 30 capsule 0  . magnesium oxide (MAG-OX) 400 (241.3 MG) MG  tablet Take 1 tablet (400 mg total) by mouth daily. (Patient taking differently: Take 400 mg by mouth once a week. ) 30 tablet 3  . midodrine (PROAMATINE) 10 MG tablet Take 1 tablet (10 mg total) by mouth 2 (two) times daily with a meal. Medication to keep your blood pressure from falling. 60 tablet 3  . oxyCODONE 10 MG TABS Take 1 tablet (10 mg total) by mouth every 6 (six) hours as needed for moderate pain. 30 tablet 0  . pantoprazole (PROTONIX) 40 MG tablet Take 1 tablet (40 mg total) by mouth daily. 30 tablet 0  .  potassium chloride (MICRO-K) 10 MEQ CR capsule Take 10 mEq by mouth 3 (three) times daily.    [name] spironolactone (ALDACTONE) 50 MG tablet Start this 50 mg tablet 1 time daily after you finish your 25 mg tablets. (Patient taking differently: Take 50 mg by mouth daily. ) 30 tablet 3  . torsemide (DEMADEX) 10 MG tablet Take 3 tablets (30 mg total) by mouth daily. 90 tablet 3    Results for orders placed or performed during the hospital encounter of 08/13/14 (from the past 48 hour(s))  Comprehensive metabolic panel     Status: Abnormal   Collection Time: 08/13/14  9:21 PM  Result Value Ref Range   Sodium 136 135 - 145 mmol/L    Comment: Please note change in reference range.   Potassium 4.3 3.5 - 5.1 mmol/L    Comment: Please note change in reference range. DELTA CHECK NOTED    Chloride 107 96 - 112 mEq/L   CO2 17 (L) 19 - 32 mmol/L   Glucose, Bld 111 (H) 70 - 99 mg/dL   BUN 71 (H) 6 - 23 mg/dL   Creatinine, Ser 4.89 (H) 0.50 - 1.10 mg/dL   Calcium 8.1 (L) 8.4 - 10.5 mg/dL   Total Protein 7.5 6.0 - 8.3 g/dL   Albumin 2.8 (L) 3.5 - 5.2 g/dL   AST 59 (H) 0 - 37 U/L   ALT 15 0 - 35 U/L   Alkaline Phosphatase 95 39 - 117 U/L   Total Bilirubin 1.9 (H) 0.3 - 1.2 mg/dL   GFR calc non Af Amer 10 (L) >90 mL/min   GFR calc Af Amer 12 (L) >90 mL/min    Comment: (NOTE) The eGFR has been calculated using the CKD EPI equation. This calculation has not been validated in all clinical situations. eGFR's persistently <90 mL/min signify possible Chronic Kidney Disease.    Anion gap 12 5 - 15  CBC with Differential     Status: Abnormal   Collection Time: 08/13/14  9:21 PM  Result Value Ref Range   WBC 7.8 4.0 - 10.5 K/uL   RBC 2.55 (L) 3.87 - 5.11 MIL/uL   Hemoglobin 8.6 (L) 12.0 - 15.0 g/dL   HCT 24.8 (L) 36.0 - 46.0 %   MCV 97.3 78.0 - 100.0 fL   MCH 33.7 26.0 - 34.0 pg   MCHC 34.7 30.0 - 36.0 g/dL   RDW 21.9 (H) 11.5 - 15.5 %   Platelets 184 150 - 400 K/uL   Neutrophils Relative % 69 43 - 77  %   Neutro Abs 5.4 1.7 - 7.7 K/uL   Lymphocytes Relative 22 12 - 46 %   Lymphs Abs 1.7 0.7 - 4.0 K/uL   Monocytes Relative 7 3 - 12 %   Monocytes Absolute 0.5 0.1 - 1.0 K/uL   Eosinophils Relative 2 0 - 5 %  Eosinophils Absolute 0.1 0.0 - 0.7 K/uL   Basophils Relative 1 0 - 1 %   Basophils Absolute 0.1 0.0 - 0.1 K/uL  Urinalysis, Routine w reflex microscopic     Status: None   Collection Time: 08/13/14  9:38 PM  Result Value Ref Range   Color, Urine YELLOW YELLOW   APPearance CLEAR CLEAR   Specific Gravity, Urine 1.025 1.005 - 1.030   pH 5.0 5.0 - 8.0   Glucose, UA NEGATIVE NEGATIVE mg/dL   Hgb urine dipstick NEGATIVE NEGATIVE   Bilirubin Urine NEGATIVE NEGATIVE   Ketones, ur NEGATIVE NEGATIVE mg/dL   Protein, ur NEGATIVE NEGATIVE mg/dL   Urobilinogen, UA 0.2 0.0 - 1.0 mg/dL   Nitrite NEGATIVE NEGATIVE   Leukocytes, UA NEGATIVE NEGATIVE    Comment: MICROSCOPIC NOT DONE ON URINES WITH NEGATIVE PROTEIN, BLOOD, LEUKOCYTES, NITRITE, OR GLUCOSE <1000 mg/dL.   No results found.  Review of Systems  Constitutional: Negative for fever, chills, weight loss, malaise/fatigue and diaphoresis.  HENT: Negative for congestion, ear discharge, ear pain, hearing loss, nosebleeds, sore throat and tinnitus.   Eyes: Negative for blurred vision, double vision, photophobia, pain, discharge and redness.  Respiratory: Negative for cough, hemoptysis, sputum production, shortness of breath, wheezing and stridor.   Cardiovascular: Positive for leg swelling. Negative for chest pain, palpitations, orthopnea, claudication and PND.  Gastrointestinal: Positive for abdominal pain. Negative for heartburn, nausea, vomiting, diarrhea, constipation, blood in stool and melena.  Genitourinary: Negative for dysuria, urgency, frequency, hematuria and flank pain.  Musculoskeletal: Negative for myalgias, back pain, joint pain, falls and neck pain.  Skin: Negative for itching and rash.  Neurological: Negative for  dizziness, tingling, tremors, sensory change, speech change, focal weakness, seizures, loss of consciousness, weakness and headaches.  Endo/Heme/Allergies: Negative for environmental allergies and polydipsia. Does not bruise/bleed easily.  Psychiatric/Behavioral: Negative for depression, suicidal ideas, hallucinations, memory loss and substance abuse. The patient is not nervous/anxious and does not have insomnia.     Blood pressure 126/75, pulse 95, temperature 97.6 F (36.4 C), temperature source Oral, resp. rate 20, height 5' 2"  (1.575 m), weight 56.7 kg (125 lb), SpO2 100 %. Physical Exam  Constitutional: She is oriented to person, place, and time. She appears well-developed and well-nourished.  HENT:  Head: Normocephalic and atraumatic.  Eyes: Conjunctivae and EOM are normal. Pupils are equal, round, and reactive to light.  Neck: Normal range of motion. Neck supple. No JVD present. No tracheal deviation present. No thyromegaly present.  Cardiovascular: Normal rate and regular rhythm.  Exam reveals no gallop and no friction rub.   No murmur heard. Respiratory: Effort normal and breath sounds normal. No respiratory distress. She has no wheezes. She has no rales. She exhibits no tenderness.  GI: Soft. Bowel sounds are normal. She exhibits no distension. There is no tenderness. There is no rebound and no guarding.  + umbilical hernia  Musculoskeletal: Normal range of motion. She exhibits no edema or tenderness.  Lymphadenopathy:    She has no cervical adenopathy.  Neurological: She is alert and oriented to person, place, and time. She has normal reflexes. She displays normal reflexes. No cranial nerve deficit. She exhibits normal muscle tone. Coordination normal.  Skin: Skin is warm and dry. No rash noted. No erythema. No pallor.  Psychiatric: She has a normal mood and affect. Her behavior is normal. Judgment and thought content normal.     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  Otherwise continue to monitor    Jani Gravel 08/14/2014, 1:01 AM

## 2014-08-14 NOTE — Progress Notes (Signed)
Patient vomited x2 in ultrasound, continues to complain of nausea. Dr. Roderic Palau notified.

## 2014-08-14 NOTE — Progress Notes (Signed)
Patient was admitted to the hospital earlier this morning by Dr. Maudie Mercury  Patient seen and examined.  She is sitting up in bed, says she feels a little better. Has some nausea but no vomiting, trying to eat lunch. Denies any dizziness but does have weakness. She was found to have acute on chronic renal failure and has a history of cirrhosis. She has been started on IV fluids, but already has evidence of anasarca. Nephrology has been consulted to assist in her management. Will monitor urine output. She also has a metabolic acidosis which is likely related to renal failure. Per nephrology, she'll be started on oral bicarbonate replacement. Continue to follow.  Bertis Hustead

## 2014-08-14 NOTE — Progress Notes (Signed)
UR chart review completed.  

## 2014-08-15 LAB — IRON AND TIBC: Iron: 134 ug/dL (ref 42–145)

## 2014-08-15 LAB — CBC
HEMATOCRIT: 23.1 % — AB (ref 36.0–46.0)
Hemoglobin: 7.7 g/dL — ABNORMAL LOW (ref 12.0–15.0)
MCH: 32.5 pg (ref 26.0–34.0)
MCHC: 33.3 g/dL (ref 30.0–36.0)
MCV: 97.5 fL (ref 78.0–100.0)
Platelets: 169 10*3/uL (ref 150–400)
RBC: 2.37 MIL/uL — ABNORMAL LOW (ref 3.87–5.11)
RDW: 21.5 % — ABNORMAL HIGH (ref 11.5–15.5)
WBC: 7.6 10*3/uL (ref 4.0–10.5)

## 2014-08-15 LAB — BASIC METABOLIC PANEL
Anion gap: 12 (ref 5–15)
BUN: 66 mg/dL — ABNORMAL HIGH (ref 6–23)
CHLORIDE: 108 meq/L (ref 96–112)
CO2: 17 mmol/L — ABNORMAL LOW (ref 19–32)
CREATININE: 4.39 mg/dL — AB (ref 0.50–1.10)
Calcium: 7.8 mg/dL — ABNORMAL LOW (ref 8.4–10.5)
GFR calc Af Amer: 13 mL/min — ABNORMAL LOW (ref >90)
GFR, EST NON AFRICAN AMERICAN: 12 mL/min — AB (ref >90)
Glucose, Bld: 81 mg/dL (ref 70–99)
Potassium: 4.1 mmol/L (ref 3.5–5.1)
SODIUM: 137 mmol/L (ref 135–145)

## 2014-08-15 LAB — FERRITIN: FERRITIN: 1063 ng/mL — AB (ref 10–291)

## 2014-08-15 LAB — PHOSPHORUS: Phosphorus: 10.9 mg/dL — ABNORMAL HIGH (ref 2.3–4.6)

## 2014-08-15 NOTE — Progress Notes (Signed)
PROGRESS NOTE  Wanda [name] GDJ:[id number] DOB: Mar 12, 1976 DOA: 08/13/2014 PCP: [name]  Followed at [name] for consideration of transplant  Summary: 39 year old woman with history of NAFLD followed at [facility] for consideration of transplant who had blood work done at that facility 1/5 and was subsequently called and told to come to the emergency department for further evaluation because of creatinine of 5.  Assessment/Plan: 1. Acute renal failure superimposed on chronic kidney disease stage III with evidence of anasarca, etiology unclear, differential includes prerenal syndrome, HTN, interstitial nephritis urology suggests hypotension. Improving. Management per nephrology. 2. Metabolic acidosis secondary to acute renal failure. Started on oral bicarbonate. Management per nephrology. 3. 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 4. Anemia of chronic disease. Appears to be near baseline. No evidence of bleeding. 5. Mild generalized abdominal pain. Ultrasound is show significant pelvic ascites. She is afebrile, in no leukocytosis, exam and history not suggestive of SBP. 6. Chronic borderline hypotension, stop Inderal, continue midodrine. Likely secondary to underlying cirrhosis 7. Bipolar disorder.   Continue diuretics and management of renal failure and metabolic acidosis per nephrology.  Stop inderal, blood pressure cannot tolerate  Check CBC in the morning  Therapeutic paracentesis 1/7  Check urine pregnancy  Code Status: full code DVT prophylaxis: SCDs Family Communication: none present Disposition Plan: home  Murray Hodgkins, MD  [facility]  Pager [phone] If 7PM-7AM, please contact night-coverage at [email or web address], password [facility] 08/15/2014, 3:54 PM  LOS: 2 days   Consultants:  Nephrology  Procedures:    Antibiotics:  Ceftriaxone 1/5 >>  HPI/Subjective: Late entry. She reports some  nausea and some vomiting earlier today. Some abdominal swelling. Mild generalized abdominal pain.  Objective: Filed Vitals:   08/15/14 0541 08/15/14 0852 08/15/14 1056 08/15/14 1357  BP: 91/51 88/36 85/47  85/49  Pulse: 50 55 62 56  Temp: 98 F (36.7 C)   97.9 F (36.6 C)  TempSrc: Oral   Oral  Resp: 15   16  Height:      Weight: 59.5 kg (131 lb 2.8 oz) 56.11 kg (123 lb 11.2 oz)    SpO2: 100%  99% 100%    Intake/Output Summary (Last 24 hours) at 08/15/14 1554 Last data filed at 08/15/14 1200  Gross per 24 hour  Intake 3008.75 ml  Output    200 ml  Net 2808.75 ml     Filed Weights   08/13/14 2305 08/15/14 0541 08/15/14 0852  Weight: 54.3 kg (119 lb 11.4 oz) 59.5 kg (131 lb 2.8 oz) 56.11 kg (123 lb 11.2 oz)    Exam:     Afebrile, SBP 80-90s. No hypoxia. General:  Appears calm and comfortable Eyes: PERRL, normal lids, irises  ENT: grossly normal hearing, lips & tongue Cardiovascular: RRR, no m/r/g. 2+ bilateral lower extremity edema. Respiratory: CTA bilaterally, no w/r/r. Normal respiratory effort. Abdomen: Distended, umbilical hernia easily reducible, nontender. Mild generalized pain Skin: no rash or induration Musculoskeletal: grossly normal tone BUE/BLE Psychiatric: grossly normal mood and affect, speech fluent and appropriate Neurologic: grossly non-focal.  Data Reviewed:  Capillary blood sugar stable.  Creatinine 4.89 >> 4.53 >> 4.39. BUN trending down.  Hemoglobin 8.6 >> 7.7  Pertinent data: Admission Labs   Imaging   Renal ultrasound: No hydronephrosis. Medical renal disease. Large amount of pelvic ascites.  Scheduled Meds: . bumetanide  0.5 mg Intravenous BID  . cefTRIAXone (ROCEPHIN)  IV  1 g Intravenous QHS  . midodrine  10 mg  Oral BID WC  . pantoprazole  40 mg Oral Daily  . propranolol  20 mg Oral BID  . sodium bicarbonate  650 mg Oral BID  . sodium chloride  3 mL Intravenous Q12H   Continuous Infusions:   Active Problems:   Acute  renal failure   Anasarca   Anemia   Liver cirrhosis secondary to NASH   Renal failure   Metabolic acidosis   Time spent 20 minutes

## 2014-08-15 NOTE — Progress Notes (Signed)
Pt's BP low at 88/37, HR 55. Inderal due at this time. Held Inderal. Dr. Sarajane Jews text paged and made aware. Will continue to monitor.

## 2014-08-15 NOTE — Progress Notes (Signed)
Wanda [name]  MRN: [id number]  DOB/AGE: 1976-04-27 39 y.o.  Primary Care Physician:Default, Provider, MD  Admit date: 08/13/2014  Chief Complaint:  Chief Complaint  Patient presents with  . sent by physician     S-Pt presented on  08/13/2014 with  Chief Complaint  Patient presents with  . sent by physician   .    Pt today feels better.    Pt offers no new complaints.    Meds . bumetanide  0.5 mg Intravenous BID  . cefTRIAXone (ROCEPHIN)  IV  1 g Intravenous QHS  . midodrine  10 mg Oral BID WC  . pantoprazole  40 mg Oral Daily  . propranolol  20 mg Oral BID  . sodium bicarbonate  650 mg Oral BID  . sodium chloride  3 mL Intravenous Q12H     Physical Exam: Vital signs in last 24 hours: Temp:  [97.8 F (36.6 C)-98.1 F (36.7 C)] 98 F (36.7 C) (01/06 0541) Pulse Rate:  [50-73] 55 (01/06 0852) Resp:  [15-20] 15 (01/06 0541) BP: (85-96)/(36-62) 88/36 mmHg (01/06 0852) SpO2:  [100 %] 100 % (01/06 0541) Weight:  [123 lb 11.2 oz (56.11 kg)-131 lb 2.8 oz (59.5 kg)] 123 lb 11.2 oz (56.11 kg) (01/06 0852) Weight change: 6 lb 2.8 oz (2.8 kg) Last BM Date: 08/14/14  Intake/Output from previous day: 01/05 0701 - 01/06 0700 In: 2768.8 [P.O.:480; I.V.:2288.8] Out: 100 [Urine:100] Total I/O In: -  Out: 100 [Urine:100]   Physical Exam: General- pt is awake,alert, oriented to time place and person Resp- No acute REsp distress, CTA B/L NO Rhonchi CVS- S1S2 regular in rate and rhythm GIT- BS+, soft, distended+  EXT- 1+ LE Edema( better),NO Cyanosis   Lab Results: CBC  Recent Labs  08/13/14 2121 08/15/14 0611  WBC 7.8 7.6  HGB 8.6* 7.7*  HCT 24.8* 23.1*  PLT 184 169    BMET  Recent Labs  08/14/14 0926 08/15/14 0611  NA 136 137  K 3.8 4.1  CL 109 108  CO2 15* 17*  GLUCOSE 127* 81  BUN 67* 66*  CREATININE 4.53* 4.39*  CALCIUM 7.4* 7.8*   Trend Creat 2016  4.89=>4.53=>4.39 2015  2.15=>4.13=>1.68=>1.59=>2.12( SEpt admission)  1.15=>1.95=>1.19 ( July admission)           2.3==>1.21=> 1.08 ( April admission)           3.11==>1.36==>1.18 ( February admission)  MICRO No results found for this or any previous visit (from the past 240 hour(s)).    Lab Results  Component Value Date   CALCIUM 7.8* 08/15/2014   PHOS 10.9* 08/15/2014       Impression: 1)Renal  AKI secondary to Prerenal/ATN               AKI  improving               Creat trending down slowly               Most likely sec to Hypotension               AKI on CKD               CKD stage 3               CKD sec to Multiple AKI               CKD since 2015              2)CVS- Hemodynamically  stable  3)Anemia HGb  Stable   4)Liver- Cirrhosis Sec to Steato hepatitis   5)Electrolytes Normokalemia Normonatremic   6)GERD on PPI  7)Acid base Co2 at goal    Plan:  Will continue current care.    [place] Schaper S 08/15/2014, 8:59 AM

## 2014-08-16 ENCOUNTER — Encounter (HOSPITAL_COMMUNITY): Payer: Self-pay

## 2014-08-16 ENCOUNTER — Inpatient Hospital Stay (HOSPITAL_COMMUNITY): Payer: Medicare Other

## 2014-08-16 DIAGNOSIS — R188 Other ascites: Secondary | ICD-10-CM

## 2014-08-16 LAB — LACTATE DEHYDROGENASE, PLEURAL OR PERITONEAL FLUID: LD, Fluid: 35 U/L — ABNORMAL HIGH (ref 3–23)

## 2014-08-16 LAB — BODY FLUID CELL COUNT WITH DIFFERENTIAL
EOS FL: 0 %
Lymphs, Fluid: 24 %
MONOCYTE-MACROPHAGE-SEROUS FLUID: 65 % (ref 50–90)
Neutrophil Count, Fluid: 11 % (ref 0–25)
Total Nucleated Cell Count, Fluid: 182 cu mm (ref 0–1000)

## 2014-08-16 LAB — BASIC METABOLIC PANEL
ANION GAP: 13 (ref 5–15)
BUN: 70 mg/dL — ABNORMAL HIGH (ref 6–23)
CHLORIDE: 107 meq/L (ref 96–112)
CO2: 17 mmol/L — AB (ref 19–32)
Calcium: 8.1 mg/dL — ABNORMAL LOW (ref 8.4–10.5)
Creatinine, Ser: 4.78 mg/dL — ABNORMAL HIGH (ref 0.50–1.10)
GFR calc Af Amer: 12 mL/min — ABNORMAL LOW (ref >90)
GFR calc non Af Amer: 10 mL/min — ABNORMAL LOW (ref >90)
Glucose, Bld: 86 mg/dL (ref 70–99)
Potassium: 3.8 mmol/L (ref 3.5–5.1)
Sodium: 137 mmol/L (ref 135–145)

## 2014-08-16 LAB — ALBUMIN, FLUID (OTHER): Albumin, Fluid: 0.9 g/dL

## 2014-08-16 LAB — PREGNANCY, URINE: Preg Test, Ur: NEGATIVE

## 2014-08-16 MED ORDER — ALBUMIN HUMAN 25 % IV SOLN
50.0000 g | Freq: Once | INTRAVENOUS | Status: AC
  Administered 2014-08-16: 50 g via INTRAVENOUS
  Filled 2014-08-16: qty 200

## 2014-08-16 MED ORDER — DEXTROSE 5 % IV SOLN
INTRAVENOUS | Status: AC
  Filled 2014-08-16: qty 2

## 2014-08-16 MED ORDER — SODIUM BICARBONATE 650 MG PO TABS
650.0000 mg | ORAL_TABLET | Freq: Three times a day (TID) | ORAL | Status: DC
  Administered 2014-08-16 – 2014-08-20 (×12): 650 mg via ORAL
  Filled 2014-08-16 (×12): qty 1

## 2014-08-16 MED ORDER — DARBEPOETIN ALFA 60 MCG/0.3ML IJ SOSY
60.0000 ug | PREFILLED_SYRINGE | INTRAMUSCULAR | Status: DC
Start: 2014-08-16 — End: 2014-08-19
  Administered 2014-08-16: 60 ug via SUBCUTANEOUS
  Filled 2014-08-16: qty 0.3

## 2014-08-16 MED ORDER — SEVELAMER CARBONATE 800 MG PO TABS
2400.0000 mg | ORAL_TABLET | Freq: Three times a day (TID) | ORAL | Status: DC
  Administered 2014-08-16 – 2014-08-20 (×9): 2400 mg via ORAL
  Filled 2014-08-16 (×9): qty 3

## 2014-08-16 MED ORDER — CEFTRIAXONE SODIUM IN DEXTROSE 40 MG/ML IV SOLN
2.0000 g | Freq: Every day | INTRAVENOUS | Status: DC
  Administered 2014-08-16 – 2014-08-19 (×4): 2 g via INTRAVENOUS
  Filled 2014-08-16 (×5): qty 50

## 2014-08-16 MED ORDER — TORSEMIDE 20 MG PO TABS
100.0000 mg | ORAL_TABLET | Freq: Every day | ORAL | Status: DC
  Administered 2014-08-16 – 2014-08-20 (×5): 100 mg via ORAL
  Filled 2014-08-16 (×5): qty 5

## 2014-08-16 MED ORDER — MIDODRINE HCL 5 MG PO TABS
15.0000 mg | ORAL_TABLET | Freq: Two times a day (BID) | ORAL | Status: DC
  Administered 2014-08-17 – 2014-08-20 (×7): 15 mg via ORAL
  Filled 2014-08-16 (×7): qty 3

## 2014-08-16 MED ORDER — SPIRONOLACTONE 25 MG PO TABS
100.0000 mg | ORAL_TABLET | Freq: Every day | ORAL | Status: DC
  Administered 2014-08-16 – 2014-08-20 (×5): 100 mg via ORAL
  Filled 2014-08-16 (×5): qty 4

## 2014-08-16 NOTE — Progress Notes (Signed)
Subjective: edema Interval History: has complaints edema and no significant urine output improvement. Patient was on and off difficulty breathing. Presently her appetite is poor but she doesn't have any nausea vomiting..  Objective: Vital signs in last 24 hours: Temp:  [97.4 F (36.3 C)-98.2 F (36.8 C)] 98 F (36.7 C) (01/07 1304) Pulse Rate:  [59-63] 63 (01/07 1304) Resp:  [14-20] 14 (01/07 1304) BP: (76-103)/(32-48) 76/32 mmHg (01/07 1304) SpO2:  [100 %] 100 % (01/07 1304) Weight:  [56.7 kg (125 lb)] 56.7 kg (125 lb) (01/07 0601) Weight change: -3.39 kg (-7 lb 7.6 oz)  Intake/Output from previous day: 01/06 0701 - 01/07 0700 In: 990 [P.O.:840; IV Piggyback:150] Out: 300 [Urine:300] Intake/Output this shift: Total I/O In: 480 [P.O.:480] Out: -   General appearance: alert, cooperative and no distress Resp: diminished breath sounds posterior - bilateral and wheezes posterior - bilateral Cardio: regular rate and rhythm, S1, S2 normal, no murmur, click, rub or gallop Extremities: edema She has 2+ edema bilaterally right greater than left  Lab Results:  Recent Labs  08/13/14 2121 08/15/14 0611  WBC 7.8 7.6  HGB 8.6* 7.7*  HCT 24.8* 23.1*  PLT 184 169   BMET:  Recent Labs  08/15/14 0611 08/16/14 0610  NA 137 137  K 4.1 3.8  CL 108 107  CO2 17* 17*  GLUCOSE 81 86  BUN 66* 70*  CREATININE 4.39* 4.78*  CALCIUM 7.8* 8.1*   No results for input(s): PTH in the last 72 hours. Iron Studies:  Recent Labs  08/15/14 0611  IRON 134  TIBC NOT CALC  FERRITIN 1063*    Studies/Results: US Paracentesis  08/16/2014   CLINICAL DATA:  Cirrhosis due to NASH, ascites  EXAM: ULTRASOUND GUIDED DIAGNOSTIC AND THERAPEUTIC PARACENTESIS  COMPARISON:  08/14/2014 renal ultrasound  PROCEDURE: Procedure, benefits, and risks of procedure were discussed with patient.  Written informed consent for procedure was obtained.  Time out protocol followed.  Adequate collection of ascites  localized by ultrasound in RIGHT lower quadrant.  Skin prepped and draped in usual sterile fashion.  Skin and soft tissues anesthetized with 10 mL of 1% lidocaine.  5 [place] Yueh catheter placed into peritoneal cavity.  1000 mL of yellow fluid aspirated by vacuum bottle suction.  Volume of ascites removed was limited to 1 L at request of referring physician due to low blood pressure.  Procedure tolerated well by patient without immediate complication.  FINDINGS: A total of approximately 1000 mL of ascitic fluid was removed. A fluid sample of 180 mL was sent for laboratory analysis.  IMPRESSION: Successful ultrasound guided paracentesis yielding 1000 mL of ascites.   Electronically Signed   By: Lavonia Dana M.D.   On: 08/16/2014 12:03    I have reviewed the patient's current medications.  Assessment/Plan: Problem #1 acute on chronic renal failure. Her BUN and creatinine presently increasing. Patient this moment was low pressure and chronic liver disease with decreased urine output possibly we may be dealing with hepatorenal versus prerenal. However ATN also need to be put in differential diagnosis. Problem #2 anasarca shows put on Bumex without significant improvement. Patient usually respond to IV Lasix and also Demadex. Discussing with the patient she didn't have any true allergy to Lasix but according to her when she was taking it did work at home and she assumed that is an allergic reaction. Patient didn't have any skin rash, no itching and no difficulty breathing when she is taking Demadex at home. Problem #3 anemia: Her  iron saturation is not calculated because of low total iron binding capacity. However her ferritin is very high. Her hemoglobin and hematocrit is declining. Possibly the accommodation of anemia of chronic disease and iron deficiency. Problem #4 liver cirrhosis Problem #5 bipolar disorder Problem #6 metabolic acidosis: Sodium bicarbonate her CO2 remains low. Problem #7 metabolic or  disease her calcium is range but her phosphorus is very high and patient is not on any binder. Plan: We'll start patient on Demadex 100 mg by mouth once a day We'll DC Bumex We'll start patient on Aldactone 100 mg by mouth once a day We'll start patient on Renvela 800 mg 3 tablets by mouth 3 times a day with meals and 2 with snacks At this moment her progresses seems to be poor. I discussed with her about dialysis and patient doesn't seem to care whatever we do as long as his helping her. I mentioned to her because of her low blood pressure and liver cirrhosis she may not do that well. Patient seems to understand. Hence if renal function continued to get worse we'll consider starting her on hemodialysis. We'll start patient on Aranesp 60 g subcutaneous once a week. We'll check her basic metabolic panel and CBC in the morning    LOS: 3 days   Wanda [name] S 08/16/2014,3:35 PM

## 2014-08-16 NOTE — Progress Notes (Signed)
Pt's manual BP 76/40 and HR 56 on telemetry. Pt currently sleeping on and off. Dr. Sarajane Jews paged and made aware. MD to address shortly.

## 2014-08-16 NOTE — Progress Notes (Signed)
[facility] PROGRESS NOTE  Wanda [name] ZOX:[id number] DOB: 19-Feb-1976 DOA: 08/13/2014 PCP: [facility]  Assessment/Plan: Acute renal failure: may be multifactorial per nephrology i. E. Hepatorenal vs prereanal vs ATN. Creatinine trending up. Urine output 300. Evaluated by nephrology who recommend Demadex as well as Aldactone and discontinuation of Bumex. Renal note indicates prognosis poor and dialysis likely will be needed.     Anasarca: nursing reports improvement from yesterday. Reportedly allergy to lasix. Medication adjustment per nephrology as above    Anemia: below baseline yesterday. Aranesp weekly.    Liver cirrhosis secondary to NASH: s/p banding. Reportedly on [facility] list at [facility]. S/p banding 11/25 and 12/15.   Ascites: last paracentesis 6 weeks ago per patient. S/p paracentesis today with removal 1L. Await labs. She is non-toxic appearing.   Hypotension: chronic in setting of liver disease.  SBP range 88-103. Somewhat below baseline s/p paracentesis.  Albumin per renal. Monitor.     Metabolic acidosis: only slight improvement. Sodium bicarb supplement po started per renal.   Bipolar disorder: appears stable at baseline  Code Status: full Family Communication: none present Disposition Plan: home when ready   Consultants:  nephrology  Procedures:  Paracentesis 08/16/14  Antibiotics:  Ceftriaxone 08/14/14>>  HPI/Subjective: Continued pain LE and reports not much urine output  Objective: Filed Vitals:   08/16/14 1543  BP: 76/40  Pulse: 59  Temp:   Resp:     Intake/Output Summary (Last 24 hours) at 08/16/14 1611 Last data filed at 08/16/14 1200  Gross per 24 hour  Intake    990 ml  Output    200 ml  Net    790 ml   Filed Weights   08/15/14 0541 08/15/14 0852 08/16/14 0601  Weight: 59.5 kg (131 lb 2.8 oz) 56.11 kg (123 lb 11.2 oz) 56.7 kg (125 lb)    Exam:   General:  Appears chronically ill but no  distress  Cardiovascular: RRR No M/g/r. LE edema trace to 1+ with right >left mild tenderness to palpation  Respiratory: normal effort BS somewhat distant but clear  Abdomen: mildly distended +BS umbilical hernia  Musculoskeletal: joints without swelling/erythema   Data Reviewed: Basic Metabolic Panel:  Recent Labs Lab 08/13/14 2121 08/14/14 0926 08/15/14 0611 08/16/14 0610  NA 136 136 137 137  K 4.3 3.8 4.1 3.8  CL 107 109 108 107  CO2 17* 15* 17* 17*  GLUCOSE 111* 127* 81 86  BUN 71* 67* 66* 70*  CREATININE 4.89* 4.53* 4.39* 4.78*  CALCIUM 8.1* 7.4* 7.8* 8.1*  PHOS  --   --  10.9*  --    Liver Function Tests:  Recent Labs Lab 08/13/14 2121 08/14/14 0926  AST 59* 50*  ALT 15 13  ALKPHOS 95 85  BILITOT 1.9* 1.6*  PROT 7.5 6.6  ALBUMIN 2.8* 2.5*   No results for input(s): LIPASE, AMYLASE in the last 168 hours. No results for input(s): AMMONIA in the last 168 hours. CBC:  Recent Labs Lab 08/13/14 2121 08/15/14 0611  WBC 7.8 7.6  NEUTROABS 5.4  --   HGB 8.6* 7.7*  HCT 24.8* 23.1*  MCV 97.3 97.5  PLT 184 169   Cardiac Enzymes: No results for input(s): CKTOTAL, CKMB, CKMBINDEX, TROPONINI in the last 168 hours. BNP (last 3 results) No results for input(s): PROBNP in the last 8760 hours. CBG: No results for input(s): GLUCAP in the last 168 hours.  No results found for this or any previous visit (from the  past 240 hour(s)).   Studies: US Paracentesis  08/16/2014   CLINICAL DATA:  Cirrhosis due to NASH, ascites  EXAM: ULTRASOUND GUIDED DIAGNOSTIC AND THERAPEUTIC PARACENTESIS  COMPARISON:  08/14/2014 renal ultrasound  PROCEDURE: Procedure, benefits, and risks of procedure were discussed with patient.  Written informed consent for procedure was obtained.  Time out protocol followed.  Adequate collection of ascites localized by ultrasound in RIGHT lower quadrant.  Skin prepped and draped in usual sterile fashion.  Skin and soft tissues anesthetized with 10 mL of  1% lidocaine.  5 [place] Yueh catheter placed into peritoneal cavity.  1000 mL of yellow fluid aspirated by vacuum bottle suction.  Volume of ascites removed was limited to 1 L at request of referring physician due to low blood pressure.  Procedure tolerated well by patient without immediate complication.  FINDINGS: A total of approximately 1000 mL of ascitic fluid was removed. A fluid sample of 180 mL was sent for laboratory analysis.  IMPRESSION: Successful ultrasound guided paracentesis yielding 1000 mL of ascites.   Electronically Signed   By: Lavonia Dana M.D.   On: 08/16/2014 12:03    Scheduled Meds: . cefTRIAXone (ROCEPHIN)  IV  1 g Intravenous QHS  . Darbepoetin Alfa  60 mcg Subcutaneous Q7 days  . midodrine  10 mg Oral BID WC  . pantoprazole  40 mg Oral Daily  . sevelamer carbonate  2,400 mg Oral TID WC  . sodium bicarbonate  650 mg Oral TID  . sodium chloride  3 mL Intravenous Q12H  . spironolactone  100 mg Oral Daily  . torsemide  100 mg Oral Daily   Continuous Infusions:   Active Problems:     Time spent: 35 minutes    [facility] Pager [phone]. If 7PM-7AM, please contact night-coverage at [email or web address], password [facility] 08/16/2014, 4:11 PM  LOS: 3 days

## 2014-08-16 NOTE — Progress Notes (Signed)
Paracentesis complete no signs of distress. 1000 ml yellow colored ascites removed.

## 2014-08-16 NOTE — Procedures (Signed)
PreOperative Dx: Cirrhosis, ascites Postoperative Dx: Cirrhosis, ascites Procedure:   US guided paracentesis Radiologist:  Thornton Papas Anesthesia:  10 ml of 1% lidocaine Specimen:  1000 ml of yellow ascitic fluid                                     Limited to 1 L due to hypotension EBL:   < 1 ml Complications: None

## 2014-08-17 DIAGNOSIS — E871 Hypo-osmolality and hyponatremia: Secondary | ICD-10-CM | POA: Diagnosis not present

## 2014-08-17 LAB — CERULOPLASMIN: Ceruloplasmin: 21 mg/dL (ref 18–53)

## 2014-08-17 LAB — CBC
HEMATOCRIT: 21 % — AB (ref 36.0–46.0)
Hemoglobin: 7.3 g/dL — ABNORMAL LOW (ref 12.0–15.0)
MCH: 33.8 pg (ref 26.0–34.0)
MCHC: 34.8 g/dL (ref 30.0–36.0)
MCV: 97.2 fL (ref 78.0–100.0)
Platelets: 119 10*3/uL — ABNORMAL LOW (ref 150–400)
RBC: 2.16 MIL/uL — AB (ref 3.87–5.11)
RDW: 21.2 % — AB (ref 11.5–15.5)
WBC: 6.4 10*3/uL (ref 4.0–10.5)

## 2014-08-17 LAB — BASIC METABOLIC PANEL
Anion gap: 15 (ref 5–15)
BUN: 71 mg/dL — AB (ref 6–23)
CO2: 16 mmol/L — ABNORMAL LOW (ref 19–32)
CREATININE: 5.3 mg/dL — AB (ref 0.50–1.10)
Calcium: 8.1 mg/dL — ABNORMAL LOW (ref 8.4–10.5)
Chloride: 102 mEq/L (ref 96–112)
GFR calc non Af Amer: 9 mL/min — ABNORMAL LOW (ref >90)
GFR, EST AFRICAN AMERICAN: 11 mL/min — AB (ref >90)
GLUCOSE: 101 mg/dL — AB (ref 70–99)
Potassium: 3.5 mmol/L (ref 3.5–5.1)
Sodium: 133 mmol/L — ABNORMAL LOW (ref 135–145)

## 2014-08-17 LAB — CREATININE, URINE, RANDOM: Creatinine, Urine: 55.33 mg/dL

## 2014-08-17 LAB — SODIUM, URINE, RANDOM: SODIUM UR: 65 mmol/L

## 2014-08-17 LAB — AMYLASE, PERITONEAL FLUID: Amylase, peritoneal fluid: 37 U/L

## 2014-08-17 MED ORDER — SODIUM CHLORIDE 0.9 % IV SOLN
Freq: Once | INTRAVENOUS | Status: AC
  Administered 2014-08-17: 18:00:00 via INTRAVENOUS

## 2014-08-17 MED ORDER — ONDANSETRON HCL 4 MG/2ML IJ SOLN
4.0000 mg | INTRAMUSCULAR | Status: DC
  Administered 2014-08-17 – 2014-08-28 (×55): 4 mg via INTRAVENOUS
  Filled 2014-08-17 (×57): qty 2

## 2014-08-17 MED ORDER — CEFAZOLIN SODIUM-DEXTROSE 2-3 GM-% IV SOLR
2.0000 g | Freq: Once | INTRAVENOUS | Status: DC
  Filled 2014-08-17: qty 50

## 2014-08-17 NOTE — Progress Notes (Signed)
Patient ID: Wanda [name], female   DOB: 1976/04/21, 39 y.o.   MRN: [id number]   Pt scheduled for tunneled HD cath in IR at [facility] 1/9 Must be to [facility] via ambulance by 830 am 1/9---see Nursing order  Pt will return to [facility] after procedure

## 2014-08-17 NOTE — Progress Notes (Signed)
UR chart review completed.  

## 2014-08-17 NOTE — Clinical Social Work Psychosocial (Signed)
Clinical Social Work Department BRIEF PSYCHOSOCIAL ASSESSMENT 08/17/2014  Patient:  CHRISTELLA, APP     Account Number:  [id number]     Admit date:  08/13/2014  Clinical Social Worker:  Wyatt Haste  Date/Time:  08/17/2014 03:42 PM  Referred by:  Care Management  Date Referred:  08/17/2014 Referred for  Other - See comment   Other Referral:   outpatient dialysis   Interview type:  Patient Other interview type:    PSYCHOSOCIAL DATA Living Status:  SIGNIFICANT OTHER Admitted from facility:   Level of care:   Primary support name:  [name] Primary support relationship to patient:  FRIEND Degree of support available:   supportive per pt    CURRENT CONCERNS Current Concerns  Post-Acute Placement   Other Concerns:    SOCIAL WORK ASSESSMENT / PLAN CSW met with pt at bedside. Pt alert and oriented. She states she lives with her boyfriend, [name] who is her best support. Pt is not currently working. CSW discussed outpatient dialysis with pt. She indicates she is still processing her feelings regarding dialysis, but states, "I just don't know what to expect." Pt has never known anyone on dialysis. CSW provided support. Pt is going to have tunneled catheter placement tomorrow. She requests [facility] in [place]. Pt states she has transportation and although [name] works, she has friends that may be able to assist as well. CSW sent referral to [place] at [facility].   Assessment/plan status:  Psychosocial Support/Ongoing Assessment of Needs Other assessment/ plan:   Information/referral to community resources:   Theressa Stamps    PATIENT'S/FAMILY'S RESPONSE TO PLAN OF CARE: Pt to begin dialysis this weekend. CSW will follow up on Monday regarding outpatient dialysis schedule.       Benay Pike, [place]

## 2014-08-17 NOTE — Progress Notes (Signed)
Subjective: edema Interval History: Patient still says that she is now making that much amount of urine. Probably slightly better than yesterday. She denies any orthopnea but some intermittent difficulty breathing. Appetite is good..  Objective: Vital signs in last 24 hours: Temp:  [97.9 F (36.6 C)-98 F (36.7 C)] 98 F (36.7 C) (01/08 0519) Pulse Rate:  [59-64] 63 (01/08 0519) Resp:  [14-16] 16 (01/08 0519) BP: (76-95)/(32-48) 95/48 mmHg (01/08 0519) SpO2:  [100 %] 100 % (01/08 0519) Weight:  [57.8 kg (127 lb 6.8 oz)] 57.8 kg (127 lb 6.8 oz) (01/08 0519) Weight change: 1.69 kg (3 lb 11.6 oz)  Intake/Output from previous day: 01/07 0701 - 01/08 0700 In: 770 [P.O.:720; IV Piggyback:50] Out: 200 [Urine:200] Intake/Output this shift: Total I/O In: 3 [I.V.:3] Out: 250 [Urine:250]  Generally she is alert and in no apparent distress Chest decreased breath sounds bilaterally Heart exam reveals regular rate and rhythm no murmur Abdomen: Full but nontender Extremities she has 2+ edema bilaterally right greater than left.  Lab Results:  Recent Labs  08/15/14 0611 08/17/14 0638  WBC 7.6 6.4  HGB 7.7* 7.3*  HCT 23.1* 21.0*  PLT 169 119*   BMET:   Recent Labs  08/16/14 0610 08/17/14 0638  NA 137 133*  K 3.8 3.5  CL 107 102  CO2 17* 16*  GLUCOSE 86 101*  BUN 70* 71*  CREATININE 4.78* 5.30*  CALCIUM 8.1* 8.1*   No results for input(s): PTH in the last 72 hours. Iron Studies:   Recent Labs  08/15/14 0611  IRON 134  TIBC NOT CALC  FERRITIN 1063*    Studies/Results: US Paracentesis  08/16/2014   CLINICAL DATA:  Cirrhosis due to NASH, ascites  EXAM: ULTRASOUND GUIDED DIAGNOSTIC AND THERAPEUTIC PARACENTESIS  COMPARISON:  08/14/2014 renal ultrasound  PROCEDURE: Procedure, benefits, and risks of procedure were discussed with patient.  Written informed consent for procedure was obtained.  Time out protocol followed.  Adequate collection of ascites localized by  ultrasound in RIGHT lower quadrant.  Skin prepped and draped in usual sterile fashion.  Skin and soft tissues anesthetized with 10 mL of 1% lidocaine.  5 [place] Yueh catheter placed into peritoneal cavity.  1000 mL of yellow fluid aspirated by vacuum bottle suction.  Volume of ascites removed was limited to 1 L at request of referring physician due to low blood pressure.  Procedure tolerated well by patient without immediate complication.  FINDINGS: A total of approximately 1000 mL of ascitic fluid was removed. A fluid sample of 180 mL was sent for laboratory analysis.  IMPRESSION: Successful ultrasound guided paracentesis yielding 1000 mL of ascites.   Electronically Signed   By: Lavonia Dana M.D.   On: 08/16/2014 12:03    I have reviewed the patient's current medications.  Assessment/Plan: Problem #1 acute on chronic renal failure. Her BUN and creatinine presently increasing. Inpatient is oliguric. Problem #2 anasarca : Status post paracentesis. Presently she is on high dose of Demadex and Aldactone without significant improvement. Problem #3 anemia: Her iron saturation is not calculated because of low total iron binding capacity. However her ferritin is very high. Her hemoglobin and hematocrit is declining. Possibly the accommodation of anemia of chronic disease patient started on her up to 14. Problem #4 liver cirrhosis Problem #5 bipolar disorder Problem #6 metabolic acidosis: Sodium bicarbonate her CO2 remains low. Problem #7 metabolic or disease her calcium is range but her phosphorus is very high and patient is on binder. Plan: We'll continue  patient on Demadex and Aldactone. We'll send patient for tunneled catheter placement and initiate dialysis. At this moment her prognosis seems to be poor especially if this is due to hepatorenal syndrome. Have discussed with the patient and patient wants to try everything.Burnis Medin check her basic metabolic panel and CBC in the morning    LOS: 4 days    Jourdain Guay S 08/17/2014,12:26 PM

## 2014-08-17 NOTE — Progress Notes (Signed)
[facility] PROGRESS NOTE  COLLIER MONICA BTD:[id number] DOB: 13-May-1976 DOA: 08/13/2014 PCP: [facility]  Assessment/Plan: Acute renal failure: Creatinine continues to worsen. Urine output decreasing. Follow renal recommendations.  Renal note indicates prognosis poor and dialysis likely will be needed.    Anasarca: little improvement. Albumin given yesterday.  Medication adjustment per nephrology.   Anemia: below baseline. Hg 7.3 today down from 7.7 yesterday. Aranesp weekly. Consider transfusion if falls below 7. monitor   Liver cirrhosis secondary to NASH: s/p banding. Reportedly on transplant list at [facility]. S/p banding 11/25 and 12/15.   Ascites: last paracentesis 6 weeks ago per patient. S/p paracentesis 08/16/14 with removal 1L. Await labs. Rocephin day #2.  She remains afebrile and non-toxic appearing.   Hypotension: chronic in setting of liver disease.Below baseline s/p paracentesis. Albumin per renal. SBP range 88-95 last 16 hours. Remains asymptomatic. Consider transfusion.   Monitor.    Metabolic acidosis: no improvement.  Sodium bicarb supplement po started per renal.   Bipolar disorder: appears stable at baseline  Hyponatremia  Nausea: no vomiting and eating. Report prn zofran not helping. Will change zofran to scheduled.   Code Status: full Family Communication: none present Disposition Plan: home when ready   Consultants:  nephrology  Procedures:  Paracentesis 08/16/14  Antibiotics:  Ceftriaxone 08/14/14>>  HPI/Subjective: Sitting up in bed eating lunch. Reports current pain medicine and nausea medicine not helping  Objective: Filed Vitals:   08/17/14 0519  BP: 95/48  Pulse: 63  Temp: 98 F (36.7 C)  Resp: 16    Intake/Output Summary (Last 24 hours) at 08/17/14 1216 Last data filed at 08/17/14 1207  Gross per 24 hour  Intake    293 ml  Output    450 ml  Net   -157 ml   Filed Weights   08/15/14 0852 08/16/14 0601  08/17/14 0519  Weight: 56.11 kg (123 lb 11.2 oz) 56.7 kg (125 lb) 57.8 kg (127 lb 6.8 oz)    Exam:   General: thin,  Chronically ill appearing, color is grey  Cardiovascular: RRR, No MGR trace LE edema with R>L.   Respiratory: normal effort BS somewhat distant but clear bilaterally to ausculation  Abdomen: +BS mildly firm umbilical hernia, mild diffuse tenderness  Musculoskeletal: no clubbing or cyanosis   Data Reviewed: Basic Metabolic Panel:  Recent Labs Lab 08/13/14 2121 08/14/14 0926 08/15/14 0611 08/16/14 0610 08/17/14 0638  NA 136 136 137 137 133*  K 4.3 3.8 4.1 3.8 3.5  CL 107 109 108 107 102  CO2 17* 15* 17* 17* 16*  GLUCOSE 111* 127* 81 86 101*  BUN 71* 67* 66* 70* 71*  CREATININE 4.89* 4.53* 4.39* 4.78* 5.30*  CALCIUM 8.1* 7.4* 7.8* 8.1* 8.1*  PHOS  --   --  10.9*  --   --    Liver Function Tests:  Recent Labs Lab 08/13/14 2121 08/14/14 0926  AST 59* 50*  ALT 15 13  ALKPHOS 95 85  BILITOT 1.9* 1.6*  PROT 7.5 6.6  ALBUMIN 2.8* 2.5*   No results for input(s): LIPASE, AMYLASE in the last 168 hours. No results for input(s): AMMONIA in the last 168 hours. CBC:  Recent Labs Lab 08/13/14 2121 08/15/14 0611 08/17/14 0638  WBC 7.8 7.6 6.4  NEUTROABS 5.4  --   --   HGB 8.6* 7.7* 7.3*  HCT 24.8* 23.1* 21.0*  MCV 97.3 97.5 97.2  PLT 184 169 119*   Cardiac Enzymes: No results for input(s):  CKTOTAL, CKMB, CKMBINDEX, TROPONINI in the last 168 hours. BNP (last 3 results) No results for input(s): PROBNP in the last 8760 hours. CBG: No results for input(s): GLUCAP in the last 168 hours.  Recent Results (from the past 240 hour(s))  Body fluid culture     Status: None (Preliminary result)   Collection Time: 08/16/14 11:10 AM  Result Value Ref Range Status   Specimen Description ASCITIC FLUID  Final   Special Requests NONE  Final   Gram Stain   Final    RARE WBC PRESENT,BOTH PMN AND MONONUCLEAR NO ORGANISMS SEEN Performed at [facility]    Culture   Final    NO GROWTH 1 DAY Performed at [facility]    Report Status PENDING  Incomplete     Studies: US Paracentesis  08/16/2014   CLINICAL DATA:  Cirrhosis due to NASH, ascites  EXAM: ULTRASOUND GUIDED DIAGNOSTIC AND THERAPEUTIC PARACENTESIS  COMPARISON:  08/14/2014 renal ultrasound  PROCEDURE: Procedure, benefits, and risks of procedure were discussed with patient.  Written informed consent for procedure was obtained.  Time out protocol followed.  Adequate collection of ascites localized by ultrasound in RIGHT lower quadrant.  Skin prepped and draped in usual sterile fashion.  Skin and soft tissues anesthetized with 10 mL of 1% lidocaine.  5 [place] Yueh catheter placed into peritoneal cavity.  1000 mL of yellow fluid aspirated by vacuum bottle suction.  Volume of ascites removed was limited to 1 L at request of referring physician due to low blood pressure.  Procedure tolerated well by patient without immediate complication.  FINDINGS: A total of approximately 1000 mL of ascitic fluid was removed. A fluid sample of 180 mL was sent for laboratory analysis.  IMPRESSION: Successful ultrasound guided paracentesis yielding 1000 mL of ascites.   Electronically Signed   By: Lavonia Dana M.D.   On: 08/16/2014 12:03    Scheduled Meds: . cefTRIAXone (ROCEPHIN)  IV  2 g Intravenous QHS  . Darbepoetin Alfa  60 mcg Subcutaneous Q7 days  . midodrine  15 mg Oral BID WC  . ondansetron (ZOFRAN) IV  4 mg Intravenous Q4H  . pantoprazole  40 mg Oral Daily  . sevelamer carbonate  2,400 mg Oral TID WC  . sodium bicarbonate  650 mg Oral TID  . sodium chloride  3 mL Intravenous Q12H  . spironolactone  100 mg Oral Daily  . torsemide  100 mg Oral Daily   Continuous Infusions:   Active Problems:   Acute renal failure   Anasarca   Anemia   Liver cirrhosis secondary to NASH   Renal failure   Metabolic acidosis   Hyponatremia    Time spent: 35 minutes    [facility] Pager [phone]. If 7PM-7AM, please contact night-coverage at [email or web address], password [facility] 08/17/2014, 12:16 PM  LOS: 4 days

## 2014-08-18 ENCOUNTER — Encounter (HOSPITAL_COMMUNITY): Payer: Self-pay | Admitting: Radiology

## 2014-08-18 ENCOUNTER — Ambulatory Visit (HOSPITAL_COMMUNITY)
Admission: RE | Admit: 2014-08-18 | Discharge: 2014-08-18 | Disposition: A | Discharge status: Home / Self Care | Payer: Medicare Other | Source: Ambulatory Visit | Attending: Nephrology | Admitting: Nephrology

## 2014-08-18 DIAGNOSIS — Z992 Dependence on renal dialysis: Secondary | ICD-10-CM

## 2014-08-18 DIAGNOSIS — N186 End stage renal disease: Secondary | ICD-10-CM | POA: Insufficient documentation

## 2014-08-18 LAB — CBC
HEMATOCRIT: 24.5 % — AB (ref 36.0–46.0)
Hemoglobin: 8.5 g/dL — ABNORMAL LOW (ref 12.0–15.0)
MCH: 32.9 pg (ref 26.0–34.0)
MCHC: 34.7 g/dL (ref 30.0–36.0)
MCV: 95 fL (ref 78.0–100.0)
Platelets: 115 10*3/uL — ABNORMAL LOW (ref 150–400)
RBC: 2.58 MIL/uL — ABNORMAL LOW (ref 3.87–5.11)
RDW: 20.6 % — AB (ref 11.5–15.5)
WBC: 6.9 10*3/uL (ref 4.0–10.5)

## 2014-08-18 LAB — PROTIME-INR
INR: 1.29 (ref 0.00–1.49)
PROTHROMBIN TIME: 16.2 s — AB (ref 11.6–15.2)

## 2014-08-18 LAB — BASIC METABOLIC PANEL
ANION GAP: 14 (ref 5–15)
BUN: 70 mg/dL — AB (ref 6–23)
CO2: 18 mmol/L — AB (ref 19–32)
Calcium: 8.3 mg/dL — ABNORMAL LOW (ref 8.4–10.5)
Chloride: 103 mEq/L (ref 96–112)
Creatinine, Ser: 5.52 mg/dL — ABNORMAL HIGH (ref 0.50–1.10)
GFR calc Af Amer: 10 mL/min — ABNORMAL LOW (ref >90)
GFR, EST NON AFRICAN AMERICAN: 9 mL/min — AB (ref >90)
Glucose, Bld: 96 mg/dL (ref 70–99)
Potassium: 3.5 mmol/L (ref 3.5–5.1)
Sodium: 135 mmol/L (ref 135–145)

## 2014-08-18 LAB — APTT: aPTT: 36 seconds (ref 24–37)

## 2014-08-18 LAB — ALT: ALT: 11 U/L (ref 0–35)

## 2014-08-18 MED ORDER — LIDOCAINE-PRILOCAINE 2.5-2.5 % EX CREA: 1.0000 "application " | TOPICAL_CREAM | CUTANEOUS | Status: DC | PRN

## 2014-08-18 MED ORDER — SODIUM CHLORIDE 0.9 % IJ SOLN
10.0000 mL | INTRAMUSCULAR | Status: DC | PRN
  Administered 2014-08-21 – 2014-08-27 (×4): 10 mL via INTRAVENOUS
  Filled 2014-08-18 (×4): qty 10

## 2014-08-18 MED ORDER — SODIUM CHLORIDE 0.9 % IV SOLN: 100.0000 mL | INTRAVENOUS | Status: DC | PRN

## 2014-08-18 MED ORDER — SODIUM CHLORIDE 0.9 % IJ SOLN
INTRAMUSCULAR | Status: AC
  Filled 2014-08-18: qty 12

## 2014-08-18 MED ORDER — HEPARIN SODIUM (PORCINE) 1000 UNIT/ML IJ SOLN
INTRAMUSCULAR | Status: AC
  Filled 2014-08-18: qty 1

## 2014-08-18 MED ORDER — TUBERCULIN PPD 5 UNIT/0.1ML ID SOLN
5.0000 [IU] | Freq: Once | INTRADERMAL | Status: AC
  Administered 2014-08-18: 5 [IU] via INTRADERMAL
  Filled 2014-08-18: qty 0.1

## 2014-08-18 MED ORDER — NEPRO/CARBSTEADY PO LIQD: 237.0000 mL | ORAL | Status: DC | PRN

## 2014-08-18 MED ORDER — CEFAZOLIN SODIUM-DEXTROSE 2-3 GM-% IV SOLR
INTRAVENOUS | Status: AC
  Filled 2014-08-18: qty 50

## 2014-08-18 MED ORDER — LIDOCAINE-EPINEPHRINE (PF) 1 %-1:200000 IJ SOLN
INTRAMUSCULAR | Status: AC
  Filled 2014-08-18: qty 10

## 2014-08-18 MED ORDER — CEFAZOLIN (ANCEF) 1 G IV SOLR
INTRAVENOUS | Status: AC | PRN
  Administered 2014-08-18: 2 g

## 2014-08-18 MED ORDER — PENTAFLUOROPROP-TETRAFLUOROETH EX AERO: 1.0000 "application " | INHALATION_SPRAY | CUTANEOUS | Status: DC | PRN

## 2014-08-18 MED ORDER — HEPARIN SODIUM (PORCINE) 1000 UNIT/ML IJ SOLN
INTRAMUSCULAR | Status: AC
Start: 2014-08-18 — End: 2014-08-19
  Filled 2014-08-18: qty 5

## 2014-08-18 MED ORDER — CEFAZOLIN SODIUM-DEXTROSE 2-3 GM-% IV SOLR
2.0000 g | Freq: Once | INTRAVENOUS | Status: DC
  Filled 2014-08-18: qty 50

## 2014-08-18 MED ORDER — MIDAZOLAM HCL 2 MG/2ML IJ SOLN
INTRAMUSCULAR | Status: AC
Start: 2014-08-18 — End: 2014-08-18
  Filled 2014-08-18: qty 2

## 2014-08-18 MED ORDER — FENTANYL CITRATE 0.05 MG/ML IJ SOLN
INTRAMUSCULAR | Status: AC | PRN
  Administered 2014-08-18 (×2): 25 ug via INTRAVENOUS

## 2014-08-18 MED ORDER — ALTEPLASE 2 MG IJ SOLR
2.0000 mg | Freq: Once | INTRAMUSCULAR | Status: AC | PRN
  Filled 2014-08-18: qty 2

## 2014-08-18 MED ORDER — MIDAZOLAM HCL 2 MG/2ML IJ SOLN
INTRAMUSCULAR | Status: AC | PRN
  Administered 2014-08-18 (×2): 0.5 mg via INTRAVENOUS

## 2014-08-18 MED ORDER — FENTANYL CITRATE 0.05 MG/ML IJ SOLN
INTRAMUSCULAR | Status: AC
  Filled 2014-08-18: qty 2

## 2014-08-18 NOTE — Progress Notes (Signed)
Patient arrived back to room from dialysis. Patient is alert and oriented and has no complaints at this time. Patients VS were within normal limits. Tunnel cath dressing was dry, intact, and had old drainage. Will continue to monitor patient this time.

## 2014-08-18 NOTE — Progress Notes (Signed)
PROGRESS NOTE  Wanda [name] GQB:[id number] DOB: September 24, 1975 DOA: 08/13/2014 PCP: [name]  Followed at [facility] for consideration of transplant  Summary: 39 year old woman with history of NAFLD followed at [facility] for consideration of transplant who had blood work done at that facility 1/5 and was subsequently called and told to come to the emergency department for further evaluation because of creatinine of 5. Elevated creatinine was confirmed, she was seen in consultation with nephrology. Renal ultrasound was unremarkable. Despite nephrology management diuretics creatinine slowly worsened. Nephrology plans placement of dialysis catheter 1/9 and initiation of hemodialysis. Differential at this time includes prerenal, ATN, hepatorenal.  Assessment/Plan: 1. Acute renal failure superimposed on chronic kidney disease stage III, with anasarca. Etiology unclear, differential includes prerenal, ATN, hepatorenal. Urine output is improved by creatinine still trending upward. Nephrology plans initiation of hemodialysis. 2. Metabolic acidosis, secondary to renal failure. Continue oral bicarbonate, management per nephrology. 3. 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 Anemia of chronic disease with anemia of acute illness, likely related to acute renal failure. Status post 1 unit packed red blood cells 1/8. 5. Possible SBP. This was considered on admission although at this point it appears her pain is more chronic. Diagnostic paracentesis did not suggest SBP and culture no growth thus far. 6. Chronic hypotension, continue midodrine. Dose increased this admission. Inderal stopped. Secondary to cirrhosis. Asymptomatic.   Prognosis guarded. Creatinine still higher although urine output is increased. Overall appears clinically stable. Plan for initiation of hemodialysis after insertion of dialysis catheter as  recommended by nephrology.  Plan additional unit packed red blood cells after return from dialysis catheter insertion.  Discontinue telemetry  I have offered to discuss with anyone she would like but she wishes to keep her health information private. She appears to have a good understanding of all issues and inquires as to what her hemoglobin and creatinine as. She clearly understands that she has renal failure and liver disease.  Code Status: full code DVT prophylaxis: SCDs Family Communication: none present Disposition Plan: home  Murray Hodgkins, MD  [facility]  Pager [phone] If 7PM-7AM, please contact night-coverage at [email or web address], password [facility] 08/18/2014, 8:00 AM  LOS: 5 days   Consultants:  Nephrology  Procedures:  Diagnostic paracentesis 1/7  1/8 transfusion one unit packed red blood cells  Antibiotics:  Ceftriaxone 1/5 >>  HPI/Subjective: Overall feels okay. No shortness of breath. She does have some upper abdominal tightness. Chronic abdominal pain persists. She has been able to eat some ice cream but oral intake has been poor. Nausea ongoing.  Objective: Filed Vitals:   08/17/14 2342 08/18/14 0015 08/18/14 0300 08/18/14 0649  BP: 93/46 90/48 94/52  92/55  Pulse: 57 56 56 62  Temp: 98.3 F (36.8 C) 97.5 F (36.4 C) 98.2 F (36.8 C) 98.4 F (36.9 C)  TempSrc: Oral Oral Oral Oral  Resp: 16 16 16 16   Height:      Weight:    57.017 kg (125 lb 11.2 oz)  SpO2: 100% 100% 98% 100%    Intake/Output Summary (Last 24 hours) at 08/18/14 0800 Last data filed at 08/18/14 0554  Gross per 24 hour  Intake   1352 ml  Output    860 ml  Net    492 ml     Filed Weights   08/16/14 0601 08/17/14 0519 08/18/14 0649  Weight: 56.7 kg (125 lb) 57.8 kg (127 lb 6.8 oz) 57.017 kg (125  lb 11.2 oz)    Exam:     Afebrile, SBP 90s. 100% on room air. General:  Appears calm and comfortable. Standing and sitting, ambulating without difficulty. Cardiovascular: RRR,  no m/r/g. No change in bilateral lower extremity edema 2-3+ Telemetry: SR, no arrhythmias  Respiratory: CTA bilaterally, no w/r/r. Normal respiratory effort. Musculoskeletal: grossly normal tone BUE/BLE Psychiatric: grossly normal mood and affect, speech fluent and appropriate  Data Reviewed:  Urine output 860  Creatinine 5.3 >> 5.52  Hemoglobin 7.3 >> 8.5 status post 1 unit packed red blood cells  Platelet count stable, 115  Pending:  Pertinent data: Admission Labs  12/14 creatinine 1.22.  Admission creatinine 1/4: 4.89 >> 4.53 >> 4.39  >>4.78 >> 5.3 >> 5.52 Imaging   Renal ultrasound: No hydronephrosis. Medical renal disease. Large amount of pelvic ascites.  Scheduled Meds: .  ceFAZolin (ANCEF) IV  2 g Intravenous Once  . cefTRIAXone (ROCEPHIN)  IV  2 g Intravenous QHS  . Darbepoetin Alfa  60 mcg Subcutaneous Q7 days  . midodrine  15 mg Oral BID WC  . ondansetron (ZOFRAN) IV  4 mg Intravenous Q4H  . pantoprazole  40 mg Oral Daily  . sevelamer carbonate  2,400 mg Oral TID WC  . sodium bicarbonate  650 mg Oral TID  . sodium chloride  3 mL Intravenous Q12H  . spironolactone  100 mg Oral Daily  . torsemide  100 mg Oral Daily   Continuous Infusions:   Principal Problem:   Acute renal failure Active Problems:   Anasarca   Anemia   Liver cirrhosis secondary to NASH   Renal failure   Metabolic acidosis   Hyponatremia   Time spent 25 minutes

## 2014-08-18 NOTE — Sedation Documentation (Signed)
[facility] arrived, report given.

## 2014-08-18 NOTE — Sedation Documentation (Signed)
Pt c/o pain and stated she could not feel sedation meds yet, however most pain is associated w/ abdomen. Pain level 7/10

## 2014-08-18 NOTE — H&P (Signed)
Reason for consult: Acute on chronic renal failure Chief Complaint: Chief Complaint  Patient presents with  . sent by physician    Referring Physician(s): Dr. Lowanda Foster  History of Present Illness: Wanda [name] is a 39 y.o. female with acute on chronic renal failure and cirrhosis s/p esophageal banding. Nephrology has seen the patient and requested tunneled HD catheter. She denies any chest pain, shortness of breath or palpitations. She denies any active signs of bleeding or excessive bruising. She denies any recent fever or chills. The patient denies any history of sleep apnea or chronic oxygen use. She has previously tolerated sedation without complications.    Past Medical History  Diagnosis Date  . GERD (gastroesophageal reflux disease)   . 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 c/w steatohepatitis  . Folate deficiency 09/2013  . Anasarca 10/10/2013  . Hematemesis/vomiting blood 02/24/2014  . Acute blood loss anemia 02/25/2014    Status post transfusion  . Macrocytosis 02/28/2014  . 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 Acute renal failure 09/2013    Pre-renal- resolved  . C. difficile colitis 04/19/2014  . Fatty liver disease, nonalcoholic 5366    Past Surgical History  Procedure Laterality Date  . None    . Paracentesis  Feb 2015    1180 fluid, negative fluid analysis.   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 Paracentesis  10/2013  . Colonoscopy N/A 12/19/2013    SLF:NO OBVIOUS SOURCE FOR ANEMIA IDETIFIED/ONE COLON POLYP REMOVED/Small internal hemorrhoids  . Esophagogastroduodenoscopy N/A 02/11/2014    Dr. Rourk:Esophageal varices with bleeding stigmata-status post esophageal band ligation therapy. Portal gastropathy  . Esophagogastroduodenoscopy N/A 07/04/2014    Procedure:  ESOPHAGOGASTRODUODENOSCOPY (EGD);  Surgeon: Daneil Dolin, MD;  Location: [facility];  Service: Endoscopy;  Laterality: N/A;  945  . Esophageal banding  07/04/2014    Procedure: ESOPHAGEAL BANDING;  Surgeon: Daneil Dolin, MD;  Location: [facility];  Service: Endoscopy;;  . Esophagogastroduodenoscopy (egd) with propofol N/A 07/24/2014    Procedure: ESOPHAGOGASTRODUODENOSCOPY (EGD) WITH PROPOFOL;  Surgeon: Danie Binder, MD;  Location: [facility] OR[facility];  Service: Endoscopy;  Laterality: N/A;  . Esophageal banding N/A 07/24/2014    Procedure: ESOPHAGEAL BANDING (2 bands applied);  Surgeon: Danie Binder, MD;  Location: [facility];  Service: Endoscopy;  Laterality: N/A;    Allergies: Lasix  Medications: Prior to Admission medications   Medication Sig Start Date End Date Taking? Authorizing Provider  lidocaine (XYLOCAINE) 2 % solution 2 TSP  PO QAC AND HS TO PREVENT FOR CHEST PAIN WHILE EATING Patient taking differently: Use as directed 10 mLs in the mouth or throat See admin instructions. 2 TSP  PO QAC AND HS TO PREVENT FOR CHEST PAIN WHILE EATING 07/26/14  Yes Danie Binder, MD  loperamide (IMODIUM) 2 MG capsule Take 1 capsule (2 mg total) by mouth 2 (two) times daily as needed for diarrhea or loose stools. 05/18/14  Yes Maryann Mikhail, DO  magnesium oxide (MAG-OX) 400 (241.3 MG) MG tablet Take 1 tablet (400 mg total) by mouth daily. Patient taking differently: Take 400 mg by mouth once a week.  04/27/14  Yes Rexene Alberts, MD  midodrine (PROAMATINE) 10 MG tablet Take 1 tablet (10 mg total) by mouth 2 (two) times daily with a meal. Medication to keep your blood  pressure from falling. 04/27/14  Yes Rexene Alberts, MD  oxyCODONE 10 MG TABS Take 1 tablet (10 mg total) by mouth every 6 (six) hours as needed for moderate pain. 07/07/14  Yes Donne Hazel, MD  pantoprazole (PROTONIX) 40 MG tablet Take 1 tablet (40 mg total) by mouth daily. 07/07/14  Yes Donne Hazel, MD  potassium chloride (MICRO-K) 10  MEQ CR capsule Take 10 mEq by mouth 3 (three) times daily. 03/02/14  Yes Historical Provider, MD  spironolactone (ALDACTONE) 50 MG tablet Start this 50 mg tablet 1 time daily after you finish your 25 mg tablets. Patient taking differently: Take 50 mg by mouth daily.  04/27/14  Yes Rexene Alberts, MD  torsemide (DEMADEX) 10 MG tablet Take 3 tablets (30 mg total) by mouth daily. 04/27/14  Yes Rexene Alberts, MD    Family History  Problem Relation Age of Onset  . Heart disease Mother   . Colon cancer Neg Hx   . Liver disease Neg Hx     History   Social History  . Marital Status: Single    Spouse Name: N/A    Number of Children: 1  . Years of Education: N/A   Occupational History  . unemployed    Social History Main Topics  . Smoking status: Former Smoker -- 0.25 packs/day for 20 years    Types: Cigarettes    Quit date: 11/05/2008  . Smokeless tobacco: Never Used  . Alcohol Use: No  . Drug Use: No  . Sexual Activity:    Partners: Male    Birth Control/ Protection: None   Other Topics Concern  . None   Social History Narrative    Review of Systems: A 12 point ROS discussed and pertinent positives are indicated in the HPI above.  All other systems are negative.  Review of Systems  Vital Signs: BP 92/55 mmHg  Pulse 62  Temp(Src) 98.4 F (36.9 C) (Oral)  Resp 16  Ht 5\' 2"  (1.575 m)  Wt 125 lb 11.2 oz (57.017 kg)  BMI 22.98 kg/m2  SpO2 100%  Physical Exam  Constitutional: She is oriented to person, place, and time. No distress.  HENT:  Head: Normocephalic and atraumatic.  Cardiovascular: Normal rate and regular rhythm.  Exam reveals no gallop and no friction rub.   No murmur heard. Pulmonary/Chest: Effort normal and breath sounds normal. No respiratory distress. She has no wheezes. She has no rales.  Neurological: She is alert and oriented to person, place, and time.  Skin: She is not diaphoretic.   Imaging: US Renal  08/14/2014   CLINICAL DATA:  39 year old with  hepatic cirrhosis and portal hypertension, presenting with acute renal failure.  EXAM: RENAL/URINARY TRACT ULTRASOUND COMPLETE  COMPARISON:  No prior urinary tract ultrasound. CT abdomen and pelvis 09/11/2013.  FINDINGS: Right Kidney:  Length: Approximately 11.0 cm. Echogenic parenchyma. Well-preserved cortex. No focal parenchymal abnormality. No hydronephrosis.  Left Kidney:  Length: Approximately 10.1 cm. Echogenic parenchyma. Well-preserved cortex. No focal parenchymal abnormality. No hydronephrosis.  Bladder:  Likely decompressed, as it was not clearly visualized in the pelvis.  Other findings:  Large amount of pelvic ascites.  IMPRESSION: 1. No evidence of hydronephrosis involving either kidney. 2. Echogenic renal parenchyma bilaterally consistent with medical renal disease. 3. Large amount of pelvic ascites.   Electronically Signed   By: Evangeline Dakin M.D.   On: 08/14/2014 15:46   US Paracentesis  08/16/2014   CLINICAL DATA:  Cirrhosis due to NASH, ascites  EXAM:  ULTRASOUND GUIDED DIAGNOSTIC AND THERAPEUTIC PARACENTESIS  COMPARISON:  08/14/2014 renal ultrasound  PROCEDURE: Procedure, benefits, and risks of procedure were discussed with patient.  Written informed consent for procedure was obtained.  Time out protocol followed.  Adequate collection of ascites localized by ultrasound in RIGHT lower quadrant.  Skin prepped and draped in usual sterile fashion.  Skin and soft tissues anesthetized with 10 mL of 1% lidocaine.  5 [place] Yueh catheter placed into peritoneal cavity.  1000 mL of yellow fluid aspirated by vacuum bottle suction.  Volume of ascites removed was limited to 1 L at request of referring physician due to low blood pressure.  Procedure tolerated well by patient without immediate complication.  FINDINGS: A total of approximately 1000 mL of ascitic fluid was removed. A fluid sample of 180 mL was sent for laboratory analysis.  IMPRESSION: Successful ultrasound guided paracentesis yielding 1000 mL  of ascites.   Electronically Signed   By: Lavonia Dana M.D.   On: 08/16/2014 12:03   US Paracentesis  07/27/2014   CLINICAL DATA:  Hepatic cirrhosis, ascites  EXAM: ULTRASOUND GUIDED DIAGNOSTIC AND THERAPEUTIC PARACENTESIS  COMPARISON:  06/15/2014  PROCEDURE: Procedure, benefits, and risks of procedure were discussed with patient.  Written informed consent for procedure was obtained.  Time out protocol followed.  Adequate collection of ascites localized by ultrasound in RIGHT lower quadrant.  Skin prepped and draped in usual sterile fashion.  Skin and soft tissues anesthetized with 10 mL of 1% lidocaine.  5 [place] Yueh catheter placed into peritoneal cavity.  4300 mL of amber colored ascites aspirated by vacuum bottle suction.  Procedure tolerated well by patient without immediate complication.  FINDINGS: A total of approximately 4300 mL of ascitic fluid was removed. A fluid sample of 180 mL was sent for laboratory analysis.  IMPRESSION: Successful ultrasound guided paracentesis yielding 4300 mL of ascites.   Electronically Signed   By: Lavonia Dana M.D.   On: 07/27/2014 15:53    Labs:  CBC:  Recent Labs  08/13/14 2121 08/15/14 0611 08/17/14 0638 08/18/14 0549  WBC 7.8 7.6 6.4 6.9  HGB 8.6* 7.7* 7.3* 8.5*  HCT 24.8* 23.1* 21.0* 24.5*  PLT 184 169 119* 115*    COAGS:  Recent Labs  11/23/13 0932  05/14/14 1430  07/03/14 1542 07/04/14 0542 07/17/14 1520 08/18/14 0549  INR 1.19  < > 1.31  < > 1.34 1.32 1.22 1.29  APTT 34  --  37  --  36  --   --  36  < > = values in this interval not displayed.  BMP:  Recent Labs  08/15/14 0611 08/16/14 0610 08/17/14 0638 08/18/14 0549  NA 137 137 133* 135  K 4.1 3.8 3.5 3.5  CL 108 107 102 103  CO2 17* 17* 16* 18*  GLUCOSE 81 86 101* 96  BUN 66* 70* 71* 70*  CALCIUM 7.8* 8.1* 8.1* 8.3*  CREATININE 4.39* 4.78* 5.30* 5.52*  GFRNONAA 12* 10* 9* 9*  GFRAA 13* 12* 11* 10*    LIVER FUNCTION TESTS:  Recent Labs  07/05/14 0548  07/06/14 0624 08/13/14 2121 08/14/14 0926  BILITOT 2.1* 1.9* 1.9* 1.6*  AST 74* 56* 59* 50*  ALT 8 7 15 13   ALKPHOS 86 86 95 85  PROT 7.2 7.0 7.5 6.6  ALBUMIN 2.3* 2.4* 2.8* 2.5*    TUMOR MARKERS:  Recent Labs  06/13/14 0959  AFPTM 4.2    Assessment and Plan: Acute on chronic renal failure Cirrhosis s/p esophageal varices  banding Request for tunneled HD catheter today Patient has been NPO, no blood thinners given, afebrile, wbc wnl Labs reviewed and Ancef ordered Risks and Benefits discussed with the patient. All of the patient's questions were answered, patient is agreeable to proceed. Consent signed and in chart.   Thank you for this interesting consult.  I greatly enjoyed meeting CLORIS FLIPPO and look forward to participating in their care.    I spent a total of 20 minutes face to face in clinical consultation, greater than 50% of which was counseling/coordinating care  Signed: Hedy Jacob 08/18/2014, 9:05 AM

## 2014-08-18 NOTE — Progress Notes (Signed)
Pt given TB skin test as ordered. Pt tolerated TB skin test well. Pt's TB skin test is marked and patients site will be assessed on Monday 08/20/2013 after 1311. Will continue to monitor site.

## 2014-08-18 NOTE — Progress Notes (Signed)
Pt returned from her procedure at [facility] for placement of tunneled dialysis catheter. Received verbal report from Pearson Forster. Pt tolerated procedure well. Catheter site assessed and is clean dry and intact. Pt in no distress at this time.

## 2014-08-18 NOTE — Sedation Documentation (Signed)
[facility] called to transport pt back to [facility].

## 2014-08-18 NOTE — Sedation Documentation (Signed)
Report received from [facility] EMT.  PA, Tsosie Billing present as well.

## 2014-08-18 NOTE — Progress Notes (Signed)
Subjective: edema Interval History: Patient still says that she is now making that much amount of urine. Probably slightly better than yesterday. She denies any orthopnea but some intermittent difficulty breathing. Appetite is good..  Objective: Vital signs in last 24 hours: Temp:  [97.5 F (36.4 C)-98.4 F (36.9 C)] 98.4 F (36.9 C) (01/09 0649) Pulse Rate:  [56-69] 65 (01/09 1000) Resp:  [9-19] 14 (01/09 1000) BP: (83-110)/(46-65) 102/47 mmHg (01/09 1000) SpO2:  [98 %-100 %] 98 % (01/09 1000) Weight:  [57.017 kg (125 lb 11.2 oz)] 57.017 kg (125 lb 11.2 oz) (01/09 0649) Weight change: -0.783 kg (-1 lb 11.6 oz)  Intake/Output from previous day: 01/08 0701 - 01/09 0700 In: 1352 [P.O.:960; I.V.:7; Blood:335; IV Piggyback:50] Out: 860 [Urine:860] Intake/Output this shift:    Generally she is alert and in no apparent distress Chest decreased breath sounds bilaterally Heart exam reveals regular rate and rhythm no murmur Abdomen: Full but nontender Extremities she has 2+ edema bilaterally right greater than left.  Lab Results:  Recent Labs  08/17/14 0638 08/18/14 0549  WBC 6.4 6.9  HGB 7.3* 8.5*  HCT 21.0* 24.5*  PLT 119* 115*   BMET:   Recent Labs  08/17/14 0638 08/18/14 0549  NA 133* 135  K 3.5 3.5  CL 102 103  CO2 16* 18*  GLUCOSE 101* 96  BUN 71* 70*  CREATININE 5.30* 5.52*  CALCIUM 8.1* 8.3*   No results for input(s): PTH in the last 72 hours. Iron Studies:  No results for input(s): IRON, TIBC, TRANSFERRIN, FERRITIN in the last 72 hours.  Studies/Results: US Paracentesis  08/16/2014   CLINICAL DATA:  Cirrhosis due to NASH, ascites  EXAM: ULTRASOUND GUIDED DIAGNOSTIC AND THERAPEUTIC PARACENTESIS  COMPARISON:  08/14/2014 renal ultrasound  PROCEDURE: Procedure, benefits, and risks of procedure were discussed with patient.  Written informed consent for procedure was obtained.  Time out protocol followed.  Adequate collection of ascites localized by ultrasound in  RIGHT lower quadrant.  Skin prepped and draped in usual sterile fashion.  Skin and soft tissues anesthetized with 10 mL of 1% lidocaine.  5 [place] Yueh catheter placed into peritoneal cavity.  1000 mL of yellow fluid aspirated by vacuum bottle suction.  Volume of ascites removed was limited to 1 L at request of referring physician due to low blood pressure.  Procedure tolerated well by patient without immediate complication.  FINDINGS: A total of approximately 1000 mL of ascitic fluid was removed. A fluid sample of 180 mL was sent for laboratory analysis.  IMPRESSION: Successful ultrasound guided paracentesis yielding 1000 mL of ascites.   Electronically Signed   By: Lavonia Dana M.D.   On: 08/16/2014 12:03    I have reviewed the patient's current medications.  Assessment/Plan: Problem #1 acute on chronic renal failure. Her BUN and creatinine presently increasing. She is for tunneled catheter placement today. Patient with some nausea but no vomiting. Problem #2 anasarca : . Presently she is on high dose of Demadex and Aldactone. Patient is nonoliguric but still with anasarca. Problem #3 anemia: Her hemoglobin is low but better. Problem #4 liver cirrhosis Problem #5 bipolar disorder Problem #6 metabolic acidosis: Sodium bicarbonate her CO2 remains low, but improving. Patient is on sodium bicarbonate. Problem #7 metabolic or disease her calcium is range but her phosphorus is very high and patient is on binder. Plan: We'll continue patient on Demadex and Aldactone. We'll dialyze patient for 2 hours after she she get her access.Burnis Medin check her basic metabolic  panel and CBC in the morning  We'll dialyze patient in the morning.   LOS: 5 days   Poet Hineman S 08/18/2014,10:12 AM

## 2014-08-18 NOTE — Procedures (Signed)
Successful placement of tunneled HD catheter with tips terminating within the superior aspect of the right atrium.   The patient tolerated the procedure well without immediate post procedural complication. The catheter is ready for immediate use.  

## 2014-08-18 NOTE — Procedures (Signed)
   INITIAL HEMODIALYSIS TREATMENT NOTE:  1st ever hemodialysis completed via newly placed right IJ (tips R atrium).  Goal NOT met: BP unable to tolerate removal of 1 liter as ordered.  Ultrafiltration was interrupted for SBP<90 for a total of 1 hour 7 minutes of 2 hour session.  Net UF 18cc.  All blood was reinfused.  Catheter dressing change with HD tomorrow.  Report given to Ophelia Shoulder, RN.  Liandra Mendia L. Ioan Landini, RN, CDN

## 2014-08-19 DIAGNOSIS — D638 Anemia in other chronic diseases classified elsewhere: Secondary | ICD-10-CM

## 2014-08-19 DIAGNOSIS — D696 Thrombocytopenia, unspecified: Secondary | ICD-10-CM

## 2014-08-19 LAB — BODY FLUID CULTURE: Culture: NO GROWTH

## 2014-08-19 LAB — CBC
HEMATOCRIT: 22.7 % — AB (ref 36.0–46.0)
Hemoglobin: 7.8 g/dL — ABNORMAL LOW (ref 12.0–15.0)
MCH: 32.9 pg (ref 26.0–34.0)
MCHC: 34.4 g/dL (ref 30.0–36.0)
MCV: 95.8 fL (ref 78.0–100.0)
PLATELETS: 76 10*3/uL — AB (ref 150–400)
RBC: 2.37 MIL/uL — ABNORMAL LOW (ref 3.87–5.11)
RDW: 21.7 % — AB (ref 11.5–15.5)
WBC: 6 10*3/uL (ref 4.0–10.5)

## 2014-08-19 LAB — BASIC METABOLIC PANEL
Anion gap: 11 (ref 5–15)
BUN: 45 mg/dL — ABNORMAL HIGH (ref 6–23)
CO2: 23 mmol/L (ref 19–32)
Calcium: 8.4 mg/dL (ref 8.4–10.5)
Chloride: 101 mEq/L (ref 96–112)
Creatinine, Ser: 4.51 mg/dL — ABNORMAL HIGH (ref 0.50–1.10)
GFR calc non Af Amer: 11 mL/min — ABNORMAL LOW (ref >90)
GFR, EST AFRICAN AMERICAN: 13 mL/min — AB (ref >90)
Glucose, Bld: 109 mg/dL — ABNORMAL HIGH (ref 70–99)
Potassium: 3.5 mmol/L (ref 3.5–5.1)
Sodium: 135 mmol/L (ref 135–145)

## 2014-08-19 LAB — PHOSPHORUS: PHOSPHORUS: 6.5 mg/dL — AB (ref 2.3–4.6)

## 2014-08-19 LAB — HEPATITIS B CORE ANTIBODY, IGM: HEP B C IGM: NONREACTIVE

## 2014-08-19 LAB — PREPARE RBC (CROSSMATCH)

## 2014-08-19 LAB — HEPATITIS B SURFACE ANTIGEN: Hepatitis B Surface Ag: NEGATIVE

## 2014-08-19 MED ORDER — SODIUM CHLORIDE 0.9 % IV SOLN: 100.0000 mL | INTRAVENOUS | Status: DC | PRN

## 2014-08-19 MED ORDER — LIDOCAINE HCL (PF) 1 % IJ SOLN: 5.0000 mL | INTRAMUSCULAR | Status: DC | PRN

## 2014-08-19 MED ORDER — HEPARIN SODIUM (PORCINE) 1000 UNIT/ML DIALYSIS
1000.0000 [IU] | INTRAMUSCULAR | Status: DC | PRN
  Filled 2014-08-19: qty 1

## 2014-08-19 MED ORDER — EPOETIN ALFA 10000 UNIT/ML IJ SOLN
INTRAMUSCULAR | Status: AC
  Administered 2014-08-19: 10000 [IU] via SUBCUTANEOUS
  Filled 2014-08-19: qty 1

## 2014-08-19 MED ORDER — ALTEPLASE 2 MG IJ SOLR
2.0000 mg | Freq: Once | INTRAMUSCULAR | Status: AC | PRN
  Filled 2014-08-19: qty 2

## 2014-08-19 MED ORDER — NA FERRIC GLUC CPLX IN SUCROSE 12.5 MG/ML IV SOLN
25.0000 mg | Freq: Once | INTRAVENOUS | Status: AC
  Administered 2014-08-19: 25 mg via INTRAVENOUS
  Filled 2014-08-19: qty 2

## 2014-08-19 MED ORDER — HEPARIN SODIUM (PORCINE) 1000 UNIT/ML IJ SOLN
INTRAMUSCULAR | Status: AC
  Filled 2014-08-19: qty 4

## 2014-08-19 MED ORDER — SODIUM CHLORIDE 0.9 % IV SOLN: Freq: Once | INTRAVENOUS | Status: DC

## 2014-08-19 MED ORDER — EPOETIN ALFA 10000 UNIT/ML IJ SOLN
10000.0000 [IU] | Freq: Once | INTRAMUSCULAR | Status: AC
  Administered 2014-08-19: 10000 [IU] via SUBCUTANEOUS

## 2014-08-19 NOTE — Progress Notes (Addendum)
PROGRESS NOTE  SHANTELLA BLUBAUGH WPY:[id number] DOB: [birth year] DOA: 08/13/2014 PCP: [facility]   Summary: 39 year old woman with history of NAFLD followed at [facility] for consideration of transplant who had blood work done at that facility 1/5 and was subsequently called and told to come to the emergency department for further evaluation because of creatinine of 5. Elevated creatinine was confirmed, she was seen in consultation with nephrology. Renal ultrasound was unremarkable. Despite nephrology management diuretics creatinine slowly worsened. Nephrology plans placement of dialysis catheter 1/9 and initiation of hemodialysis. Differential at this time includes prerenal, ATN, hepatorenal.  Assessment/Plan: 1. Acute renal failure superimposed on chronic kidney disease stage III. Associated anasarca. Differential includes prerenal, ATN, hepatorenal. Urine output improved. Creatinine improved status post dialysis. However ultrafiltration goal was not met secondary to hypotension. 2. Metabolic acidosis secondary to acute renal failure, continue oral bicarbonate. Resolved status post dialysis. 3. 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 4. Anemia of chronic disease with superimposed anemia of acute illness/renal failure. No evidence of bleeding. Plan further transfusion today. 5. Thrombocytopenia. Acute on chronic. Likely related to liver disease. Not on any anticoagulants. 6. Chronic hypotension, stable. Continue midodrine. Inderal stopped. Secondary to cirrhosis. Remains asymptomatic. 7. Possible SBP. Considered on admission, it does appear at this point the patient's pain is more chronic. Diagnostic paracentesis did not suggest SBP and culture no growth, final.   Overall she appears relatively asymptomatic. Her urine output has improved. Per nephrology plans for repeat dialysis  today.  Transfuse additional 1 unit PRBC, type and cross 1 additional unit. Recheck CBC in the morning.  Code Status: full code DVT prophylaxis: SCDs Family Communication: Patient does not wish her health history to be discussed with anyone Disposition Plan: home  Murray Hodgkins, MD  [facility]  Pager [phone] If 7PM-7AM, please contact night-coverage at [email or web address], password [facility] 08/19/2014, 11:34 AM  LOS: 6 days   Consultants:  Nephrology  Procedures:  Diagnostic paracentesis 1/7  1/8 transfusion one unit packed red blood cells  1/9 tunneled HD catheter placement  1/10 transfusion one unit packed red blood cells  Antibiotics:  Ceftriaxone 1/5 >> 1/11  HPI/Subjective: Because of low blood pressure, could not remove 1 L of fluids as ordered during hemodialysis yesterday.  Overall she feels okay. No abdominal pain presently. No new complaints. She is interested in her laboratory results.  Objective: Filed Vitals:   08/19/14 0200 08/19/14 0210 08/19/14 0626 08/19/14 1025  BP: 71/31 96/54 97/46  96/48  Pulse: 63 64 66 57  Temp: 98.6 F (37 C)  98.3 F (36.8 C) 98.5 F (36.9 C)  TempSrc: Oral  Oral Oral  Resp: 14 16 16 16   Height:      Weight:   58.06 kg (128 lb)   SpO2: 99%  100% 100%    Intake/Output Summary (Last 24 hours) at 08/19/14 1134 Last data filed at 08/19/14 0900  Gross per 24 hour  Intake    963 ml  Output   1119 ml  Net   -156 ml     Filed Weights   08/18/14 0649 08/18/14 1430 08/19/14 0626  Weight: 57.017 kg (125 lb 11.2 oz) 58.7 kg (129 lb 6.6 oz) 58.06 kg (128 lb)    Exam:     Afebrile, SBP 90s. 100% on room air. General:  Appears comfortable, calm. Cardiovascular: Regular rate and rhythm, no murmur, rub or gallop. No lower extremity edema. Respiratory:  Clear to auscultation bilaterally, no wheezes, rales or rhonchi. Normal respiratory effort. Abdomen: soft, ntnd, ascites noted, soft umbilical hernia  reducible Psychiatric: grossly normal mood and affect, speech fluent and appropriate  Data Reviewed:  Urine output 1200  Creatinine 5.52 >> 4.51    Hemoglobin 7.3 >> 8.5 >> 7.8   Platelet count stable decreasing  Pertinent data:  Labs  12/14 creatinine 1.22.  Admission creatinine 1/4: 4.89 >> >> 5.52 >> 4.51   Body fluid culture no growth  Imaging   Renal ultrasound: No hydronephrosis. Medical renal disease. Large amount of pelvic ascites.  Scheduled Meds: . sodium chloride   Intravenous Once  . cefTRIAXone (ROCEPHIN)  IV  2 g Intravenous QHS  . epoetin (EPOGEN/PROCRIT) injection  10,000 Units Subcutaneous Once  . ferric gluconate (FERRLECIT/NULECIT) Test Dose  25 mg Intravenous Once  . midodrine  15 mg Oral BID WC  . ondansetron (ZOFRAN) IV  4 mg Intravenous Q4H  . pantoprazole  40 mg Oral Daily  . sevelamer carbonate  2,400 mg Oral TID WC  . sodium bicarbonate  650 mg Oral TID  . sodium chloride  3 mL Intravenous Q12H  . spironolactone  100 mg Oral Daily  . torsemide  100 mg Oral Daily  . tuberculin  5 Units Intradermal Once   Continuous Infusions:   Principal Problem:   Acute renal failure Active Problems:   Anasarca   Anemia   Liver cirrhosis secondary to NASH   Renal failure   Metabolic acidosis   Hyponatremia   Time spent 20 minutes

## 2014-08-19 NOTE — Procedures (Signed)
   HEMODIALYSIS TREATMENT NOTE:  3.5 hour heparin-free dialysis completed via right IJ tunneled catheter.  Exit site unremarkable, although pt complains of constant soreness along tunnel.  Old serosanguinous drainage.  Site without erythema or swelling.  Goal NOT met:  BP unable to tolerate removal of 2 liters as ordered.  Ultrafiltration was interrupted x 1.5 hours for SBP<90 (asymptomatic).  Net UF 915cc after 1 unit of PRBCs was transfused.  Recommend Albumin IV for next HD session.  All blood was reinfused.  Report given to [place], Therapist, sports.  Aragon Scarantino L. Lallie Strahm, RN, CDN

## 2014-08-19 NOTE — Progress Notes (Signed)
Subjective: edema Interval History: Patient feels okay. She states that she didn't see any significant sign of improvement. She doesn't have any nausea vomiting but her appetite is still poor.  Objective: Vital signs in last 24 hours: Temp:  [98.1 F (36.7 C)-98.6 F (37 C)] 98.3 F (36.8 C) (01/10 0626) Pulse Rate:  [56-89] 66 (01/10 0626) Resp:  [14-16] 16 (01/10 0626) BP: (71-109)/(31-65) 97/46 mmHg (01/10 0626) SpO2:  [97 %-100 %] 100 % (01/10 0626) Weight:  [58.06 kg (128 lb)-58.7 kg (129 lb 6.6 oz)] 58.06 kg (128 lb) (01/10 0626) Weight change: 1.683 kg (3 lb 11.4 oz)  Intake/Output from previous day: 01/09 0701 - 01/10 0700 In: 363 [P.O.:360; I.V.:3] Out: 1219 [Urine:1200] Intake/Output this shift: Total I/O In: 603 [P.O.:600; I.V.:3] Out: -   Generally she is alert and in no apparent distress Chest decreased breath sounds bilaterally Heart exam reveals regular rate and rhythm no murmur Abdomen: Full but nontender Extremities she has 2+ edema on the right and 1+ edema on the left.  Lab Results:  Recent Labs  08/18/14 0549 08/19/14 0545  WBC 6.9 6.0  HGB 8.5* 7.8*  HCT 24.5* 22.7*  PLT 115* 76*   BMET:   Recent Labs  08/18/14 0549 08/19/14 0545  NA 135 135  K 3.5 3.5  CL 103 101  CO2 18* 23  GLUCOSE 96 109*  BUN 70* 45*  CREATININE 5.52* 4.51*  CALCIUM 8.3* 8.4   No results for input(s): PTH in the last 72 hours. Iron Studies:  No results for input(s): IRON, TIBC, TRANSFERRIN, FERRITIN in the last 72 hours.  Studies/Results: Ir Fluoro Guide Cv Line Right  08/18/2014   INDICATION: End-stage renal disease. In need of intravenous access for the initiation of dialysis.  EXAM: TUNNELED CENTRAL VENOUS HEMODIALYSIS CATHETER PLACEMENT WITH ULTRASOUND AND FLUOROSCOPIC GUIDANCE  MEDICATIONS: Ancef 2 gm IV; The IV antibiotic was given in an appropriate time interval prior to skin puncture.  CONTRAST:  None  ANESTHESIA/SEDATION: Versed 1 mg IV; Fentanyl 50 mcg  IV  Total Moderate Sedation Time  15 minutes.  FLUOROSCOPY TIME:  24 seconds (4.1 mGy)  COMPLICATIONS: None immediate  PROCEDURE: Informed written consent was obtained from the patient after a discussion of the risks, benefits, and alternatives to treatment. Questions regarding the procedure were encouraged and answered. The right neck and chest were prepped with chlorhexidine in a sterile fashion, and a sterile drape was applied covering the operative field. Maximum barrier sterile technique with sterile gowns and gloves were used for the procedure. A timeout was performed prior to the initiation of the procedure.  After creating a small venotomy incision, a micropuncture kit was utilized to access the right internal jugular vein under direct, real-time ultrasound guidance after the overlying soft tissues were anesthetized with 1% lidocaine with epinephrine. Ultrasound image documentation was performed. The microwire was kinked to measure appropriate catheter length. A stiff Glidewire was advanced to the level of the IVC and the micropuncture sheath was exchanged for a peel-away sheath. A hemosplit tunneled hemodialysis catheter measuring 19 cm from tip to cuff was tunneled in a retrograde fashion from the anterior chest wall to the venotomy incision.  The catheter was then placed through the peel-away sheath with tips ultimately positioned within the superior aspect of the right atrium. Final catheter positioning was confirmed and documented with a spot radiographic image. The catheter aspirates and flushes normally. The catheter was flushed with appropriate volume heparin dwells.  The catheter exit site was  secured with a 0-Prolene retention suture. The venotomy incision was closed with an interrupted 4-0 Vicryl, Dermabond and Steri-strips. Dressings were applied. The patient tolerated the procedure well without immediate post procedural complication.  IMPRESSION: Successful placement of 19 cm tip to cuff  tunneled hemodialysis catheter via the right internal jugular vein with tips terminating within the superior aspect of the right atrium. The catheter is ready for immediate use.   Electronically Signed   By: Sandi Mariscal M.D.   On: 08/18/2014 11:36   Ir US Guide Vasc Access Right  08/18/2014   INDICATION: End-stage renal disease. In need of intravenous access for the initiation of dialysis.  EXAM: TUNNELED CENTRAL VENOUS HEMODIALYSIS CATHETER PLACEMENT WITH ULTRASOUND AND FLUOROSCOPIC GUIDANCE  MEDICATIONS: Ancef 2 gm IV; The IV antibiotic was given in an appropriate time interval prior to skin puncture.  CONTRAST:  None  ANESTHESIA/SEDATION: Versed 1 mg IV; Fentanyl 50 mcg IV  Total Moderate Sedation Time  15 minutes.  FLUOROSCOPY TIME:  24 seconds (4.1 mGy)  COMPLICATIONS: None immediate  PROCEDURE: Informed written consent was obtained from the patient after a discussion of the risks, benefits, and alternatives to treatment. Questions regarding the procedure were encouraged and answered. The right neck and chest were prepped with chlorhexidine in a sterile fashion, and a sterile drape was applied covering the operative field. Maximum barrier sterile technique with sterile gowns and gloves were used for the procedure. A timeout was performed prior to the initiation of the procedure.  After creating a small venotomy incision, a micropuncture kit was utilized to access the right internal jugular vein under direct, real-time ultrasound guidance after the overlying soft tissues were anesthetized with 1% lidocaine with epinephrine. Ultrasound image documentation was performed. The microwire was kinked to measure appropriate catheter length. A stiff Glidewire was advanced to the level of the IVC and the micropuncture sheath was exchanged for a peel-away sheath. A hemosplit tunneled hemodialysis catheter measuring 19 cm from tip to cuff was tunneled in a retrograde fashion from the anterior chest wall to the venotomy  incision.  The catheter was then placed through the peel-away sheath with tips ultimately positioned within the superior aspect of the right atrium. Final catheter positioning was confirmed and documented with a spot radiographic image. The catheter aspirates and flushes normally. The catheter was flushed with appropriate volume heparin dwells.  The catheter exit site was secured with a 0-Prolene retention suture. The venotomy incision was closed with an interrupted 4-0 Vicryl, Dermabond and Steri-strips. Dressings were applied. The patient tolerated the procedure well without immediate post procedural complication.  IMPRESSION: Successful placement of 19 cm tip to cuff tunneled hemodialysis catheter via the right internal jugular vein with tips terminating within the superior aspect of the right atrium. The catheter is ready for immediate use.   Electronically Signed   By: Sandi Mariscal M.D.   On: 08/18/2014 11:36    I have reviewed the patient's current medications.  Assessment/Plan: Problem #1 acute on chronic renal failure: She is status post a short dialysis yesterday. Presently her appetite still poor but she doesn't have any nausea or vomiting. Problem #2 anasarca : . Presently she is on high dose of Demadex and Aldactone. Her urine output is low. Yesterday unable to ultrafiltrate because of hypotension. Problem #3 anemia: Her hemoglobin is low but remains stable. Problem #4 liver cirrhosis Problem #5 bipolar disorder Problem #6 metabolic acidosis: Sodium bicarbonate her CO2 has improved after dialysis. Problem #7 metabolic or disease her  calcium is range but her phosphorus is 6.5. Presently she is on a binder and her phosphorus is improving. Plan: We'll continue patient on Demadex and Aldactone. We'll dialyze patient for 3 and half hours hours using a low temperature dialysate. We'll try to remove about 2 L if her blood pressure tolerates. We'll start patient on enteric gluconate 25 mg IV as a  test dose on dialysis. We'll start patient on Neupogen 10,000 units IV after each dialysis We'll check her basic metabolic panel and CBC in the morning  We'll dialyze patient in the morning.   LOS: 6 days   Gaelan Glennon S 08/19/2014,10:09 AM

## 2014-08-20 DIAGNOSIS — K7460 Unspecified cirrhosis of liver: Secondary | ICD-10-CM

## 2014-08-20 DIAGNOSIS — D696 Thrombocytopenia, unspecified: Secondary | ICD-10-CM

## 2014-08-20 DIAGNOSIS — Z992 Dependence on renal dialysis: Secondary | ICD-10-CM

## 2014-08-20 DIAGNOSIS — D638 Anemia in other chronic diseases classified elsewhere: Secondary | ICD-10-CM | POA: Insufficient documentation

## 2014-08-20 LAB — HEMOCHROMATOSIS DNA-PCR(C282Y,H63D)

## 2014-08-20 LAB — CBC
HCT: 25.7 % — ABNORMAL LOW (ref 36.0–46.0)
Hemoglobin: 8.8 g/dL — ABNORMAL LOW (ref 12.0–15.0)
MCH: 32.5 pg (ref 26.0–34.0)
MCHC: 34.2 g/dL (ref 30.0–36.0)
MCV: 94.8 fL (ref 78.0–100.0)
Platelets: 39 10*3/uL — ABNORMAL LOW (ref 150–400)
RBC: 2.71 MIL/uL — ABNORMAL LOW (ref 3.87–5.11)
RDW: 22.4 % — AB (ref 11.5–15.5)
WBC: 9.8 10*3/uL (ref 4.0–10.5)

## 2014-08-20 LAB — BASIC METABOLIC PANEL
Anion gap: 10 (ref 5–15)
BUN: 19 mg/dL (ref 6–23)
CO2: 24 mmol/L (ref 19–32)
Calcium: 8.4 mg/dL (ref 8.4–10.5)
Chloride: 101 mEq/L (ref 96–112)
Creatinine, Ser: 2.65 mg/dL — ABNORMAL HIGH (ref 0.50–1.10)
GFR calc Af Amer: 25 mL/min — ABNORMAL LOW (ref >90)
GFR, EST NON AFRICAN AMERICAN: 21 mL/min — AB (ref >90)
Glucose, Bld: 108 mg/dL — ABNORMAL HIGH (ref 70–99)
Potassium: 3.9 mmol/L (ref 3.5–5.1)
SODIUM: 135 mmol/L (ref 135–145)

## 2014-08-20 LAB — TYPE AND SCREEN
ABO/RH(D): A POS
Antibody Screen: NEGATIVE
UNIT DIVISION: 0
UNIT DIVISION: 0

## 2014-08-20 LAB — PHOSPHORUS: Phosphorus: 3.6 mg/dL (ref 2.3–4.6)

## 2014-08-20 LAB — PATHOLOGIST SMEAR REVIEW

## 2014-08-20 MED ORDER — LORAZEPAM 0.5 MG PO TABS
0.5000 mg | ORAL_TABLET | Freq: Two times a day (BID) | ORAL | Status: DC | PRN
  Administered 2014-08-20 – 2014-08-22 (×3): 0.5 mg via ORAL
  Filled 2014-08-20 (×3): qty 1

## 2014-08-20 MED ORDER — SODIUM CHLORIDE 0.9 % IV SOLN
125.0000 mg | INTRAVENOUS | Status: DC
  Administered 2014-08-21 – 2014-08-25 (×3): 125 mg via INTRAVENOUS
  Filled 2014-08-20 (×4): qty 10

## 2014-08-20 MED ORDER — ACETAMINOPHEN 325 MG PO TABS
650.0000 mg | ORAL_TABLET | Freq: Once | ORAL | Status: AC
  Administered 2014-08-20: 650 mg via ORAL

## 2014-08-20 NOTE — Clinical Social Work Note (Signed)
CSW faxed additional clinicals to [facility] at [facility] to arrange outpatient dialysis.  Wanda [name], [name]

## 2014-08-20 NOTE — Consult Note (Signed)
Inpatient Hematology/Oncology Consultation   Name: Wanda [name]      MRN: [id number]    Location: W098/J191-47  Date: 08/20/2014 Time:5:08 PM   REFERRING PHYSICIAN:  Carlean Jews MD  REASON FOR CONSULT:  New onset thrombocytopenia   DIAGNOSIS:   Worsening Thrombocytopenia  Biopsy proven steatohepatitis/Cirrhosis with normal platelet counts Ascites Variceal bleeding Platelet count 251K at [facility] 08/13/2014 Liver Transplant evaluation at [facility], Hep C negative, HIV negative on 03/11/9561 History of folic acid deficiency  HISTORY OF PRESENT ILLNESS:   39 year old female with multiple medical issues including steatohepatitis/cirrhosis/ascities with a recent paracentesis at the end of December.  She was just evaluated at [facility] for liver transplant. She reports being called after her appointment at [facility] and advised to go to the neared ER secondary to worsening kidney function. Since admission her platelets have been falling with a low of 39 K today.  She reports no bleeding.  Prior platelet counts have always been fairly normal. She has a chronic anemia.   She is on no antibiotics but has been on demedex and aldactone.  One dose of ceftriaxone on 08/14/2013 She was dialyzed on 08/17/2013, 08/18/2013 History of prior heparin exposure although her platelet count began to fall prior to her dialysis this weekend.    PAST MEDICAL HISTORY:   Past Medical History  Diagnosis Date  . GERD (gastroesophageal reflux disease)   . 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 c/w steatohepatitis  . Folate deficiency 09/2013  . Anasarca 10/10/2013  . Hematemesis/vomiting blood 02/24/2014  . Acute blood loss anemia 02/25/2014    Status post transfusion  . Macrocytosis 02/28/2014  . 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 Acute renal failure 09/2013    Pre-renal- resolved  . C. difficile colitis 04/19/2014  . Fatty liver disease, nonalcoholic 1308     ALLERGIES: Allergies  Allergen Reactions  . Lasix [Furosemide]     "doesn't work"      MEDICATIONS: I have reviewed the patient's current medications.     PAST SURGICAL HISTORY Past Surgical History  Procedure Laterality Date  . None    . Paracentesis  Feb 2015    1180 fluid, negative fluid analysis.   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 Paracentesis  10/2013  . Colonoscopy N/A 12/19/2013    SLF:NO OBVIOUS SOURCE FOR ANEMIA IDETIFIED/ONE COLON POLYP REMOVED/Small internal hemorrhoids  . Esophagogastroduodenoscopy N/A 02/11/2014    Dr. Rourk:Esophageal varices with bleeding stigmata-status post esophageal band ligation therapy. Portal gastropathy  . Esophagogastroduodenoscopy N/A 07/04/2014    Procedure: ESOPHAGOGASTRODUODENOSCOPY (EGD);  Surgeon: Daneil Dolin, MD;  Location: [facility];  Service: Endoscopy;  Laterality: N/A;  945  . Esophageal banding  07/04/2014    Procedure: ESOPHAGEAL BANDING;  Surgeon: Daneil Dolin, MD;  Location: [facility];  Service: Endoscopy;;  . Esophagogastroduodenoscopy (egd) with propofol N/A 07/24/2014    Procedure: ESOPHAGOGASTRODUODENOSCOPY (EGD) WITH PROPOFOL;  Surgeon: Danie Binder, MD;  Location: [facility] ORS;  Service: Endoscopy;  Laterality: N/A;  . Esophageal banding N/A 07/24/2014    Procedure: ESOPHAGEAL BANDING (2 bands applied);  Surgeon: Danie Binder, MD;  Location: [facility] ORS;  Service: Endoscopy;  Laterality: N/A;    FAMILY HISTORY: Family History  Problem Relation Age of Onset  . Heart disease Mother   .  Colon cancer Neg Hx   . Liver disease Neg Hx     SOCIAL HISTORY:  reports that she quit smoking about 5 years ago. Her smoking use included Cigarettes. She has a 5 pack-year smoking history. She has never used smokeless tobacco. She reports that she does not drink alcohol or use  illicit drugs.  PERFORMANCE STATUS: The patient's performance status is 1 - Symptomatic but completely ambulatory  PHYSICAL EXAM: Most Recent Vital Signs: Blood pressure 84/39, pulse 67, temperature 98.9 F (37.2 C), temperature source Oral, resp. rate 16, height _0  (1.575 m), weight 128 lb 8 oz (58.287 kg), SpO2 99 %. BP 84/39 mmHg  Pulse 67  Temp(Src) 98.9 F (37.2 C) (Oral)  Resp 16  Ht _1  (1.575 m)  Wt 128 lb 8 oz (58.287 kg)  BMI 23.50 kg/m2  SpO2 99%  General Appearance:    Alert, cooperative, no distress, appears older than stated age  Head:    Normocephalic, without obvious abnormality, atraumatic  Eyes:    PERRL, conjunctiva/corneas clear, EOM's intact, fundi    benign, both eyes     Nose:   Nares normal, septum midline, mucosa normal, no drainage    or sinus tenderness  Throat:   Lips, mucosa, and tongue normal; teeth and gums normal  Neck:   Supple, symmetrical, trachea midline, no adenopathy;    thyroid:  no enlargement/tenderness/nodules; no carotid   bruit or JVD  Dialysis catheter noted  Back:     Symmetric, no curvature, ROM normal, no CVA tenderness  Lungs:     Clear to auscultation bilaterally, respirations unlabored  Chest Wall:    No tenderness or deformity   Heart:    Regular rate and rhythm, S1 and S2 normal, no murmur, rub   or gallop     Abdomen:     Soft,  bowel sounds active all four quadrants,    no masses, no organomegaly  Mild ascites. Mild tenderness mid epigastric region        Extremities:   Extremities normal, atraumatic, no cyanosis or edema        Lymph nodes:   Cervical, supraclavicular, and axillary nodes normal       LABORATORY DATA:  Results for orders placed or performed during the hospital encounter of 08/13/14 (from the past 48 hour(s))  Phosphorus     Status: Abnormal   Collection Time: 08/19/14  5:45 AM  Result Value Ref Range   Phosphorus 6.5 (H) 2.3 - 4.6 mg/dL  CBC     Status: Abnormal   Collection Time: 08/19/14   5:45 AM  Result Value Ref Range   WBC 6.0 4.0 - 10.5 K/uL   RBC 2.37 (L) 3.87 - 5.11 MIL/uL   Hemoglobin 7.8 (L) 12.0 - 15.0 g/dL   HCT 22.7 (L) 36.0 - 46.0 %   MCV 95.8 78.0 - 100.0 fL   MCH 32.9 26.0 - 34.0 pg   MCHC 34.4 30.0 - 36.0 g/dL   RDW 21.7 (H) 11.5 - 15.5 %   Platelets 76 (L) 150 - 400 K/uL    Comment: DELTA CHECK NOTED  Basic metabolic panel     Status: Abnormal   Collection Time: 08/19/14  5:45 AM  Result Value Ref Range   Sodium 135 135 - 145 mmol/L    Comment: Please note change in reference range.   Potassium 3.5 3.5 - 5.1 mmol/L    Comment: Please note change in reference range.   Chloride 101 96 -  112 mEq/L   CO2 23 19 - 32 mmol/L   Glucose, Bld 109 (H) 70 - 99 mg/dL   BUN 45 (H) 6 - 23 mg/dL    Comment: DELTA CHECK NOTED   Creatinine, Ser 4.51 (H) 0.50 - 1.10 mg/dL   Calcium 8.4 8.4 - 10.5 mg/dL   GFR calc non Af Amer 11 (L) >90 mL/min   GFR calc Af Amer 13 (L) >90 mL/min    Comment: (NOTE) The eGFR has been calculated using the CKD EPI equation. This calculation has not been validated in all clinical situations. eGFR's persistently <90 mL/min signify possible Chronic Kidney Disease.    Anion gap 11 5 - 15  Prepare RBC     Status: None   Collection Time: 08/19/14  7:49 AM  Result Value Ref Range   Order Confirmation ORDER PROCESSED BY BLOOD BANK   Phosphorus     Status: None   Collection Time: 08/20/14  7:02 AM  Result Value Ref Range   Phosphorus 3.6 2.3 - 4.6 mg/dL  Basic metabolic panel     Status: Abnormal   Collection Time: 08/20/14  7:02 AM  Result Value Ref Range   Sodium 135 135 - 145 mmol/L    Comment: Please note change in reference range.   Potassium 3.9 3.5 - 5.1 mmol/L    Comment: Please note change in reference range.   Chloride 101 96 - 112 mEq/L   CO2 24 19 - 32 mmol/L   Glucose, Bld 108 (H) 70 - 99 mg/dL   BUN 19 6 - 23 mg/dL    Comment: DELTA CHECK NOTED   Creatinine, Ser 2.65 (H) 0.50 - 1.10 mg/dL   Calcium 8.4 8.4 - 10.5  mg/dL   GFR calc non Af Amer 21 (L) >90 mL/min   GFR calc Af Amer 25 (L) >90 mL/min    Comment: (NOTE) The eGFR has been calculated using the CKD EPI equation. This calculation has not been validated in all clinical situations. eGFR's persistently <90 mL/min signify possible Chronic Kidney Disease.    Anion gap 10 5 - 15  CBC     Status: Abnormal   Collection Time: 08/20/14  7:02 AM  Result Value Ref Range   WBC 9.8 4.0 - 10.5 K/uL   RBC 2.71 (L) 3.87 - 5.11 MIL/uL   Hemoglobin 8.8 (L) 12.0 - 15.0 g/dL   HCT 25.7 (L) 36.0 - 46.0 %   MCV 94.8 78.0 - 100.0 fL   MCH 32.5 26.0 - 34.0 pg   MCHC 34.2 30.0 - 36.0 g/dL   RDW 22.4 (H) 11.5 - 15.5 %   Platelets 39 (L) 150 - 400 K/uL    Comment: RESULT REPEATED AND VERIFIED SPECIMEN CHECKED FOR CLOTS PLATELET COUNT CONFIRMED BY SMEAR        ASSESSMENT:  Acute thrombocytopenia Cirrhosis Chronic Anemia History of Variceal Bleeding   PLAN:  Discussed with Dr. Sarajane Jews that at this point her thrombocytopenia may be drug induced and given her ARF/CKD would stop Demedex/Aldactone. If drug induced would expect improvement in her counts over the next several day. Would recommend CBC daily. Give platelets for less than 10K or if bleeding.  Peripheral smear is without schistocytes; therefore I do not suspect a process such as TTP.   I would also check a folic acid as she has a history of deficiency.  We sent a HIT evaluation given her prior exposure to heparin although her platelets were falling prior to her dialysis  this weekend.  If her counts do not improve over the next several days or continue to fall off her medications. Will make further recommendations at that time.   All questions were answered. The patient knows to call the clinic with any problems, questions or concerns. We can certainly see the patient much sooner if necessary.  Perl Folmar [employer]

## 2014-08-20 NOTE — Progress Notes (Signed)
UR chart review completed.  

## 2014-08-20 NOTE — Progress Notes (Signed)
Patient seen, independently examined and chart reviewed. I agree with exam, assessment and plan discussed with Dyanne Carrel, NP. This note serves as my cosign for 1/11.  Interval History: C/o upper abdominal pain, no hernia pain, some right-sided neck pain where catheter was inserted.   Objective: General:  Appears comfortable, calm. Cardiovascular: Regular rate and rhythm, no murmur, rub or gallop. 2-3+ bilateral lower extremity edema. Respiratory: Clear to auscultation bilaterally, no wheezes, rales or rhonchi. Normal respiratory effort. Abdomen: soft, distended, tender upper abdominal, soft and reducible umbilical hernia Psychiatric: grossly normal mood and affect, speech fluent and appropriate Skin: no petechiae or purpura hands or feet   Creatinine improved after hemodialysis, 2.65. Potassium normal.  Hemoglobin 8.8 after second unit of blood.  Platelet count is significantly decreased, 39  Overall the patient appears stable from a subjective standpoint. She continues to have intermittent hypotension secondary to her cirrhosis but she has no respiratory issues or hypoxia. She remains asymptomatic. We discussed in detail her current laboratory studies, current creatinine, improvement in hemoglobin after blood in the significant drop in her platelet count. She has been thrombocytopenic in the past down into the 80s but never this low. She has no evidence of bleeding at this point. She has received heparin only with dialysis which now has been discontinued. She discussed with hematology, their recommendations appreciated. Hopefully this is drug related and will rebound.  She is very inquisitive in regard to her laboratory studies and is displaying more interested in medical issues and she has in the past. I think she is largely overwhelmed by multiple issues in the acuity of her condition. She seems to grasp the severity of her liver illness and resultant complications. She had her first  appointment for consideration of transplant earlier this month and blood work from that appointment precipitated this admission. Hopefully acute issues during this hospitalization can be temporized and she can be followed as an outpatient for further workup.  Murray Hodgkins, MD [facility] [phone]

## 2014-08-20 NOTE — Progress Notes (Signed)
Subjective: edema Interval History: Patient has some nausea seems to be getting better. She denies any vomiting. She denies also any difficulty breathing.  Objective: Vital signs in last 24 hours: Temp:  [97 F (36.1 C)-98.9 F (37.2 C)] 98.9 F (37.2 C) (01/11 0555) Pulse Rate:  [57-93] 67 (01/11 0555) Resp:  [16] 16 (01/11 0555) BP: (84-114)/(39-60) 84/39 mmHg (01/11 0555) SpO2:  [97 %-100 %] 99 % (01/11 0555) Weight:  [57.9 kg (127 lb 10.3 oz)-58.287 kg (128 lb 8 oz)] 58.287 kg (128 lb 8 oz) (01/11 0555) Weight change: -0.5 kg (-1 lb 1.6 oz)  Intake/Output from previous day: 01/10 0701 - 01/11 0700 In: 8938 [P.O.:900; I.V.:3; Blood:335] Out: 1615 [Urine:700] Intake/Output this shift:    Generally she is alert and in no apparent distress Chest decreased breath sounds bilaterally Heart exam reveals regular rate and rhythm no murmur Abdomen: Full but nontender Extremities she has 1+ edema on the right and trace edema on the left.  Lab Results:  Recent Labs  08/19/14 0545 08/20/14 0702  WBC 6.0 9.8  HGB 7.8* 8.8*  HCT 22.7* 25.7*  PLT 76* 39*   BMET:   Recent Labs  08/19/14 0545 08/20/14 0702  NA 135 135  K 3.5 3.9  CL 101 101  CO2 23 24  GLUCOSE 109* 108*  BUN 45* 19  CREATININE 4.51* 2.65*  CALCIUM 8.4 8.4   No results for input(s): PTH in the last 72 hours. Iron Studies:  No results for input(s): IRON, TIBC, TRANSFERRIN, FERRITIN in the last 72 hours.  Studies/Results: No results found.  I have reviewed the patient's current medications.  Assessment/Plan: Problem #1 acute on chronic renal failure: She is status post a short dialysis yesterday. Feels a little bit better. Problem #2 anasarca : . Presently she is on high dose of Demadex and Aldactone. She had dialysis with some fluid removal feeling better. Problem #3 anemia: Her hemoglobin is low but improving. Presently patient is on IV iron and also Epogen. Problem #4 liver cirrhosis Problem #5  bipolar disorder Problem #6 metabolic acidosis: Has corrected. Problem #7 metabolic or disease her calcium and phosphorus is range. Presently she is on a binder. Plan: 1] We'll continue patient on Demadex 2] will DC Aldactone 3]We'll dialyze patient in the morning. 4]We'll try to remove about 2 L if her blood pressure tolerates. 5]We'll continue with IV iron and Epogen. 6]We'll check her basic metabolic panel and CBC in the morning 7]We'll try to make outpatient arrangement for dialysis.    LOS: 7 days   Caspar Favila S 08/20/2014,9:59 AM

## 2014-08-20 NOTE — Progress Notes (Signed)
[facility] PROGRESS NOTE  Wanda [name] NWG:[id number] DOB: [birth year] DOA: 08/13/2014 PCP: [place]  Assessment/Plan:  Acute renal failure superimposed on chronic kidney disease stage III. Associated anasarca. Differential includes prerenal, ATN, hepatorenal. Urine output improved. Creatinine continues to  improve status post dialysis. Dialysis cut short yesterday due to hypotension. Plan for dialysis tomorrow  Metabolic acidosis secondary to acute renal failure, continue oral bicarbonate. Resolved status post dialysis.  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 Being evaluated at [facility] for transplant.  Anemia of chronic disease with superimposed anemia of acute illness/renal failure. No evidence of bleeding. S/p total 2 units PRBC's since admission. Hg 8.8 today.   Thrombocytopenia. Acute on chronic. Continues to trend down.  Likely related to liver disease. Not on any anticoagulants. No s/sx active bleeding.   Chronic hypotension, stable. Continue midodrine. Secondary to cirrhosis. Remains asymptomatic.  Possible SBP. Considered on admission, it does appear at this point the patient's pain is more chronic.Diagnostic paracentesis did not suggest SBP and culture no growth, final. Will stop antibiotics  Code Status: full Family Communication: none present Disposition Plan: home when ready   Consultants:  nephrology  Procedures:  Diagnostic paracentesis 1/7  1/8 transfusion one unit packed red blood cells  1/9 tunneled HD catheter placement  1/10 transfusion one unit packed red blood cells  Antibiotics:  Rocephin 08/14/14>08/20/14  HPI/Subjective: Sitting up in bed eating lunch. Denies abdominal pain. Complains difficulty sleeping at night due to pain catheter site.   Objective: Filed Vitals:   08/20/14 0555  BP: 84/39  Pulse: 67  Temp: 98.9  F (37.2 C)  Resp: 16    Intake/Output Summary (Last 24 hours) at 08/20/14 1540 Last data filed at 08/19/14 1925  Gross per 24 hour  Intake    335 ml  Output   1215 ml  Net   -880 ml   Filed Weights   08/19/14 1545 08/19/14 1935 08/20/14 0555  Weight: 58.2 kg (128 lb 4.9 oz) 57.9 kg (127 lb 10.3 oz) 58.287 kg (128 lb 8 oz)    Exam:   General:  Appears chronically ill but not toxic  Cardiovascular: RRR No MGR No LE edema  Respiratory: normal effort BS clear bilaterally no wheeze or rhonchi  Abdomen: rounded, soft +ascites, +BS umbilical hernia  Musculoskeletal: no clubbing or cyanosis   Data Reviewed: Basic Metabolic Panel:  Recent Labs Lab 08/15/14 0611 08/16/14 0610 08/17/14 0638 08/18/14 0549 08/19/14 0545 08/20/14 0702  NA 137 137 133* 135 135 135  K 4.1 3.8 3.5 3.5 3.5 3.9  CL 108 107 102 103 101 101  CO2 17* 17* 16* 18* 23 24  GLUCOSE 81 86 101* 96 109* 108*  BUN 66* 70* 71* 70* 45* 19  CREATININE 4.39* 4.78* 5.30* 5.52* 4.51* 2.65*  CALCIUM 7.8* 8.1* 8.1* 8.3* 8.4 8.4  PHOS 10.9*  --   --   --  6.5* 3.6   Liver Function Tests:  Recent Labs Lab 08/13/14 2121 08/14/14 0926 08/18/14 1226  AST 59* 50*  --   ALT 15 13 11   ALKPHOS 95 85  --   BILITOT 1.9* 1.6*  --   PROT 7.5 6.6  --   ALBUMIN 2.8* 2.5*  --    No results for input(s): LIPASE, AMYLASE in the last 168 hours. No results for input(s): AMMONIA in the last 168 hours. CBC:  Recent Labs Lab 08/13/14 2121  08/15/14 7121 08/17/14 9758 08/18/14 0549 08/19/14 0545 08/20/14 0702  WBC 7.8 7.6 6.4 6.9 6.0 9.8  NEUTROABS 5.4  --   --   --   --   --   HGB 8.6* 7.7* 7.3* 8.5* 7.8* 8.8*  HCT 24.8* 23.1* 21.0* 24.5* 22.7* 25.7*  MCV 97.3 97.5 97.2 95.0 95.8 94.8  PLT 184 169 119* 115* 76* 39*   Cardiac Enzymes: No results for input(s): CKTOTAL, CKMB, CKMBINDEX, TROPONINI in the last 168 hours. BNP (last 3 results) No results for input(s): PROBNP in the last 8760 hours. CBG: No  results for input(s): GLUCAP in the last 168 hours.  Recent Results (from the past 240 hour(s))  Body fluid culture     Status: None   Collection Time: 08/16/14 11:10 AM  Result Value Ref Range Status   Specimen Description ASCITIC FLUID  Final   Special Requests NONE  Final   Gram Stain   Final    RARE WBC PRESENT,BOTH PMN AND MONONUCLEAR NO ORGANISMS SEEN Performed at [facility]    Culture   Final    NO GROWTH 3 DAYS Performed at [facility]    Report Status 08/19/2014 FINAL  Final     Studies: No results found.  Scheduled Meds: . sodium chloride   Intravenous Once  . cefTRIAXone (ROCEPHIN)  IV  2 g Intravenous QHS  . [START ON 08/21/2014] ferric gluconate (FERRLECIT/NULECIT) IV  125 mg Intravenous Q T,Th,Sa-HD  . midodrine  15 mg Oral BID WC  . ondansetron (ZOFRAN) IV  4 mg Intravenous Q4H  . pantoprazole  40 mg Oral Daily  . sodium chloride  3 mL Intravenous Q12H  . torsemide  100 mg Oral Daily   Continuous Infusions:   Principal Problem:   Acute renal failure Active Problems:   Anasarca   Anemia   Liver cirrhosis secondary to NASH   Renal failure   Metabolic acidosis   Hyponatremia   Thrombocytopenia    Time spent: 35 minutes    [facility] Pager [phone]. If 7PM-7AM, please contact night-coverage at [email or web address], password [facility] 08/20/2014, 3:40 PM  LOS: 7 days

## 2014-08-21 DIAGNOSIS — D508 Other iron deficiency anemias: Secondary | ICD-10-CM

## 2014-08-21 LAB — CBC
HCT: 26.8 % — ABNORMAL LOW (ref 36.0–46.0)
HEMOGLOBIN: 9 g/dL — AB (ref 12.0–15.0)
MCH: 32.4 pg (ref 26.0–34.0)
MCHC: 33.6 g/dL (ref 30.0–36.0)
MCV: 96.4 fL (ref 78.0–100.0)
PLATELETS: 47 10*3/uL — AB (ref 150–400)
RBC: 2.78 MIL/uL — AB (ref 3.87–5.11)
RDW: 22.2 % — ABNORMAL HIGH (ref 11.5–15.5)
WBC: 5.8 10*3/uL (ref 4.0–10.5)

## 2014-08-21 LAB — BASIC METABOLIC PANEL
Anion gap: 11 (ref 5–15)
BUN: 24 mg/dL — AB (ref 6–23)
CHLORIDE: 96 meq/L (ref 96–112)
CO2: 25 mmol/L (ref 19–32)
Calcium: 8.9 mg/dL (ref 8.4–10.5)
Creatinine, Ser: 3.36 mg/dL — ABNORMAL HIGH (ref 0.50–1.10)
GFR calc Af Amer: 19 mL/min — ABNORMAL LOW (ref >90)
GFR, EST NON AFRICAN AMERICAN: 16 mL/min — AB (ref >90)
Glucose, Bld: 95 mg/dL (ref 70–99)
POTASSIUM: 3.5 mmol/L (ref 3.5–5.1)
SODIUM: 132 mmol/L — AB (ref 135–145)

## 2014-08-21 LAB — FOLATE: Folate: 3.5 ng/mL

## 2014-08-21 MED ORDER — ALBUMIN HUMAN 25 % IV SOLN
25.0000 g | Freq: Once | INTRAVENOUS | Status: DC
  Filled 2014-08-21: qty 100

## 2014-08-21 MED ORDER — ALTEPLASE 2 MG IJ SOLR
2.0000 mg | Freq: Once | INTRAMUSCULAR | Status: AC | PRN
  Filled 2014-08-21: qty 2

## 2014-08-21 MED ORDER — SODIUM CHLORIDE 0.9 % IJ SOLN
INTRAMUSCULAR | Status: AC
  Administered 2014-08-21: 10 mL via INTRAVENOUS
  Filled 2014-08-21: qty 12

## 2014-08-21 MED ORDER — SODIUM CHLORIDE 0.9 % IV SOLN: 100.0000 mL | INTRAVENOUS | Status: DC | PRN

## 2014-08-21 MED ORDER — MIDODRINE HCL 5 MG PO TABS: 15.0000 mg | ORAL_TABLET | Freq: Three times a day (TID) | ORAL | Status: DC

## 2014-08-21 MED ORDER — MIDODRINE HCL 5 MG PO TABS
10.0000 mg | ORAL_TABLET | Freq: Three times a day (TID) | ORAL | Status: DC
  Administered 2014-08-21 – 2014-08-27 (×17): 10 mg via ORAL
  Filled 2014-08-21 (×18): qty 2

## 2014-08-21 NOTE — Progress Notes (Signed)
Wanda [name]  MRN: [id number]  DOB/AGE: [birth year] 39 y.o.  Primary [place] date: 08/13/2014  Chief Complaint:  Chief Complaint  Patient presents with  . sent by physician     S-Pt presented on  08/13/2014 with  Chief Complaint  Patient presents with  . sent by physician   .    Pt today feels better.    Pt offers no new complaints.    Pt seen on HD, tolerating tx well.    Meds . albumin human  25 g Intravenous Once  . ferric gluconate (FERRLECIT/NULECIT) IV  125 mg Intravenous Q T,Th,Sa-HD  . midodrine  15 mg Oral TID WC  . ondansetron (ZOFRAN) IV  4 mg Intravenous Q4H  . pantoprazole  40 mg Oral Daily  . sodium chloride  3 mL Intravenous Q12H     Physical Exam: Vital signs in last 24 hours: Temp:  [98 F (36.7 C)-98.5 F (36.9 C)] 98.3 F (36.8 C) (01/12 0619) Pulse Rate:  [48-70] 69 (01/12 0619) Resp:  [16-20] 20 (01/12 0619) BP: (92-113)/(49-56) 104/56 mmHg (01/12 0619) SpO2:  [98 %-100 %] 100 % (01/12 0619) Weight:  [126 lb 3.2 oz (57.244 kg)] 126 lb 3.2 oz (57.244 kg) (01/12 0705) Weight change:  Last BM Date: 08/20/14  Intake/Output from previous day: 01/11 0701 - 01/12 0700 In: 960 [P.O.:960] Out: 150 [Urine:150]     Physical Exam: General- pt is awake,alert, oriented to time place and person Resp- No acute REsp distress,NO Rhonchi, PC in situ CVS- S1S2 regular in rate and rhythm GIT- BS+, soft, distended+  EXT- 1+ LE Edema,NO Cyanosis   Lab Results: CBC  Recent Labs  08/20/14 0702 08/21/14 0736  WBC 9.8 5.8  HGB 8.8* 9.0*  HCT 25.7* 26.8*  PLT 39* 47*    BMET  Recent Labs  08/20/14 0702 08/21/14 0736  NA 135 132*  K 3.9 3.5  CL 101 96  CO2 24 25  GLUCOSE 108* 95  BUN 19 24*  CREATININE 2.65* 3.36*  CALCIUM 8.4 8.9   Trend Creat 2016  4.89=>4.53=>5.529 (Initiated on HD 08/18/14 sec to Anasarca) 2015  2.15=>4.13=>1.68=>1.59=>2.12( SEpt admission)           1.15=>1.95=>1.19 (  July admission)           2.3==>1.21=> 1.08 ( April admission)           3.11==>1.36==>1.18 ( February admission)  MICRO Recent Results (from the past 240 hour(s))  Body fluid culture     Status: None   Collection Time: 08/16/14 11:10 AM  Result Value Ref Range Status   Specimen Description ASCITIC FLUID  Final   Special Requests NONE  Final   Gram Stain   Final    RARE WBC PRESENT,BOTH PMN AND MONONUCLEAR NO ORGANISMS SEEN Performed at [facility]    Culture   Final    NO GROWTH 3 DAYS Performed at [facility]    Report Status 08/19/2014 FINAL  Final      Lab Results  Component Value Date   CALCIUM 8.9 08/21/2014   PHOS 3.6 08/20/2014       Impression: 1)Renal  CKD stage 3=> Much worsening on HD on 08/18/14               CKD sec to Multiple AKI               CKD since 2015  2)CVS- Hemodynamically stable  3)Anemia HGb  Stable On IV iron  4)Liver- 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  Lusine Corlett S 08/21/2014, 9:09 AM

## 2014-08-21 NOTE — Progress Notes (Signed)
[facility] PROGRESS NOTE  Wanda [name] JJH:[id number] DOB: 12/04/1975 DOA: 08/13/2014 PCP: [place]  Assessment/Plan:  Acute renal failure superimposed on chronic kidney disease stage III. Associated anasarca. Differential includes prerenal, ATN, hepatorenal. Urine output improved. Creatinine worsening this am. Dialysis scheduled for today.   Metabolic acidosis secondary to acute renal failure, continue oral bicarbonate. Resolved status post dialysis.  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 Being evaluated at [facility] for transplant.   Anemia of chronic disease with superimposed anemia of acute illness/renal failure. No evidence of bleeding. S/p total 2 units PRBC's since admission. Hg 9.0 today.   Thrombocytopenia. Acute on chronic. Trending up slightly today. Evaluated by heme who opine likely drug induced and recommended discontinuing demadex/aldactone. Not on any anticoagulants. Having a continuous ooze of blood from HD cath which has not abated.  Will try pressure dressing. If it does not resolve, will need to order platelets.   Chronic hypotension, stable. Continue midodrine - dose changed to 10 mg TID (was 15 BID). Secondary to cirrhosis. Remains asymptomatic.  Possible SBP. Considered on admission, it does appear at this point the patient's pain is more chronic.Diagnostic paracentesis did not suggest SBP and culture no growth, final. Rocephin discontinued 08/20/14.  Code Status: full Family Communication: none present Disposition Plan: home when ready   Consultants:  Nephrology  heme  Procedures:  Diagnostic paracentesis 1/7  1/8 transfusion one unit packed red blood cells  1/9 tunneled HD catheter placement  1/10 transfusion one unit packed red blood cells    Antibiotics:  Rocephin 1/5-1/11  HPI/Subjective: Reports continued pain LE   Objective: Filed Vitals:   08/21/14 0619  BP: 104/56  Pulse: 69  Temp: 98.3 F (36.8 C)  Resp: 20    Intake/Output Summary (Last 24 hours) at 08/21/14 0823 Last data filed at 08/20/14 2020  Gross per 24 hour  Intake    960 ml  Output    150 ml  Net    810 ml   Filed Weights   08/19/14 1935 08/20/14 0555 08/21/14 0705  Weight: 57.9 kg (127 lb 10.3 oz) 58.287 kg (128 lb 8 oz) 57.244 kg (126 lb 3.2 oz)    Exam:   General:  Appears chronically ill, palor  Cardiovascular: RRR no MGR No LE edeam  Respiratory: normal effort BS clear bilaterally no wheeze  Abdomen: mild distention, soft +BS mild tenderness   Musculoskeletal: no clubbing or cyanosis   Data Reviewed: Basic Metabolic Panel:  Recent Labs Lab 08/15/14 0611 08/16/14 0610 08/17/14 0638 08/18/14 0549 08/19/14 0545 08/20/14 0702  NA 137 137 133* 135 135 135  K 4.1 3.8 3.5 3.5 3.5 3.9  CL 108 107 102 103 101 101  CO2 17* 17* 16* 18* 23 24  GLUCOSE 81 86 101* 96 109* 108*  BUN 66* 70* 71* 70* 45* 19  CREATININE 4.39* 4.78* 5.30* 5.52* 4.51* 2.65*  CALCIUM 7.8* 8.1* 8.1* 8.3* 8.4 8.4  PHOS 10.9*  --   --   --  6.5* 3.6   Liver Function Tests:  Recent Labs Lab 08/14/14 0926 08/18/14 1226  AST 50*  --   ALT 13 11  ALKPHOS 85  --   BILITOT 1.6*  --   PROT 6.6  --   ALBUMIN 2.5*  --    No results for input(s): LIPASE, AMYLASE in the last 168 hours. No results for input(s): AMMONIA in the  last 168 hours. CBC:  Recent Labs Lab 08/17/14 0638 08/18/14 0549 08/19/14 0545 08/20/14 0702 08/21/14 0736  WBC 6.4 6.9 6.0 9.8 5.8  HGB 7.3* 8.5* 7.8* 8.8* 9.0*  HCT 21.0* 24.5* 22.7* 25.7* 26.8*  MCV 97.2 95.0 95.8 94.8 96.4  PLT 119* 115* 76* 39* 47*   Cardiac Enzymes: No results for input(s): CKTOTAL, CKMB, CKMBINDEX, TROPONINI in the last 168 hours. BNP (last 3 results) No results for input(s): PROBNP in the last 8760 hours. CBG: No results for input(s): GLUCAP in the last 168  hours.  Recent Results (from the past 240 hour(s))  Body fluid culture     Status: None   Collection Time: 08/16/14 11:10 AM  Result Value Ref Range Status   Specimen Description ASCITIC FLUID  Final   Special Requests NONE  Final   Gram Stain   Final    RARE WBC PRESENT,BOTH PMN AND MONONUCLEAR NO ORGANISMS SEEN Performed at [facility]    Culture   Final    NO GROWTH 3 DAYS Performed at [facility]    Report Status 08/19/2014 FINAL  Final     Studies: No results found.  Scheduled Meds: . ferric gluconate (FERRLECIT/NULECIT) IV  125 mg Intravenous Q T,Th,Sa-HD  . midodrine  15 mg Oral BID WC  . ondansetron (ZOFRAN) IV  4 mg Intravenous Q4H  . pantoprazole  40 mg Oral Daily  . sodium chloride  3 mL Intravenous Q12H   Continuous Infusions:   Principal Problem:   Acute renal failure Active Problems:   Anasarca   Anemia   Liver cirrhosis secondary to NASH   Renal failure   Metabolic acidosis   Hyponatremia   Thrombocytopenia   Absolute anemia    Time spent: 35 minutes    [facility] Pager [phone]. If 7PM-7AM, please contact night-coverage at [email or web address], password [facility] 08/21/2014, 8:23 AM  LOS: 8 days        I have examined the patient, reviewed the data base and discussed the plan with the patient and her nurse. I have modified the above note and agree with it.   Debbe Odea, MD

## 2014-08-21 NOTE — Procedures (Signed)
   HEMODIALYSIS TREATMENT NOTE:  3.5 hour heparin-free dialysis completed via right IJ tunneled catheter.  Consistent sanguinous oozing from catheter exit site.  Surgifoam/gauze dressing secured with hypafix tape.  Thrombocytopenia noted.  HIT panel pending.  Catheter locked with NS 0.9%. Sanguinous drainage reported to Dr. Wynelle Cleveland and Dr. Theador Hawthorne.  Previous attempts at ultrafiltration were unsuccessful due to intradialytic hypotension however pt remained hemodynamically stable throughout this HD session and 2.2 liters were removed without interruption in ultrafiltration.  All blood was reinfused.  Report given to Gershon Crane, RN.  Arish Redner L. Copeland Lapier, RN, CDN

## 2014-08-21 NOTE — Clinical Social Work Note (Signed)
CSW met with pt to notify her that she had been approved for dialysis. Pt requested that CSW come back at another time. Pt has been set up at [facility] on Tuesday, Thursday, Saturday schedule at 10:45 and will need to go at 10:00 the first day to complete paperwork. CSW will discuss with pt tomorrow.  Benay Pike, [place]

## 2014-08-22 LAB — HEPARIN INDUCED THROMBOCYTOPENIA PNL
PATIENT O. D.: 0.307
UFH High Dose UFH H: 0 % Release
UFH Low Dose 0.1 IU/mL: 0 % Release
UFH Low Dose 0.5 IU/mL: 0 % Release
UFH SRA Result: NEGATIVE

## 2014-08-22 LAB — BASIC METABOLIC PANEL
Anion gap: 7 (ref 5–15)
BUN: 22 mg/dL (ref 6–23)
CO2: 27 mmol/L (ref 19–32)
CREATININE: 2.4 mg/dL — AB (ref 0.50–1.10)
Calcium: 8.6 mg/dL (ref 8.4–10.5)
Chloride: 97 mEq/L (ref 96–112)
GFR calc non Af Amer: 24 mL/min — ABNORMAL LOW (ref >90)
GFR, EST AFRICAN AMERICAN: 28 mL/min — AB (ref >90)
Glucose, Bld: 130 mg/dL — ABNORMAL HIGH (ref 70–99)
Potassium: 3.8 mmol/L (ref 3.5–5.1)
Sodium: 131 mmol/L — ABNORMAL LOW (ref 135–145)

## 2014-08-22 LAB — CBC
HCT: 25.8 % — ABNORMAL LOW (ref 36.0–46.0)
HEMOGLOBIN: 8.5 g/dL — AB (ref 12.0–15.0)
MCH: 32.6 pg (ref 26.0–34.0)
MCHC: 32.9 g/dL (ref 30.0–36.0)
MCV: 98.9 fL (ref 78.0–100.0)
Platelets: 29 10*3/uL — CL (ref 150–400)
RBC: 2.61 MIL/uL — AB (ref 3.87–5.11)
RDW: 22.3 % — ABNORMAL HIGH (ref 11.5–15.5)
WBC: 6.8 10*3/uL (ref 4.0–10.5)

## 2014-08-22 MED ORDER — FOLIC ACID 1 MG PO TABS
1.0000 mg | ORAL_TABLET | Freq: Every day | ORAL | Status: DC
  Administered 2014-08-22 – 2014-08-28 (×6): 1 mg via ORAL
  Filled 2014-08-22 (×6): qty 1

## 2014-08-22 MED ORDER — SALINE SPRAY 0.65 % NA SOLN
1.0000 | NASAL | Status: DC | PRN
  Administered 2014-08-23: 1 via NASAL
  Filled 2014-08-22: qty 44

## 2014-08-22 MED ORDER — SODIUM CHLORIDE 0.9 % IV SOLN
Freq: Once | INTRAVENOUS | Status: AC
  Administered 2014-08-22: via INTRAVENOUS

## 2014-08-22 MED ORDER — LORAZEPAM 1 MG PO TABS
1.0000 mg | ORAL_TABLET | Freq: Two times a day (BID) | ORAL | Status: DC | PRN
  Administered 2014-08-22 – 2014-08-28 (×9): 1 mg via ORAL
  Filled 2014-08-22 (×10): qty 1

## 2014-08-22 MED ORDER — PREDNISONE 20 MG PO TABS
60.0000 mg | ORAL_TABLET | Freq: Every day | ORAL | Status: DC
  Administered 2014-08-22 – 2014-08-28 (×7): 60 mg via ORAL
  Filled 2014-08-22 (×7): qty 3

## 2014-08-22 NOTE — Progress Notes (Signed)
NICHA HEMANN  MRN: [id number]  DOB/AGE: Aug 29, 1975 39 y.o.  Primary [place] date: 08/13/2014  Chief Complaint:  Chief Complaint  Patient presents with  . sent by physician     S-Pt presented on  08/13/2014 with  Chief Complaint  Patient presents with  . sent by physician   .     Pt offers no new complaints.  Meds . albumin human  25 g Intravenous Once  . ferric gluconate (FERRLECIT/NULECIT) IV  125 mg Intravenous Q T,Th,Sa-HD  . midodrine  10 mg Oral TID WC  . ondansetron (ZOFRAN) IV  4 mg Intravenous Q4H  . pantoprazole  40 mg Oral Daily  . sodium chloride  3 mL Intravenous Q12H     Physical Exam: Vital signs in last 24 hours: Temp:  [98 F (36.7 C)-99 F (37.2 C)] 99 F (37.2 C) (01/13 0743) Pulse Rate:  [75-108] 77 (01/13 0743) Resp:  [16-18] 18 (01/13 0743) BP: (92-130)/(47-71) 101/47 mmHg (01/13 0743) SpO2:  [97 %-100 %] 100 % (01/13 0743) Weight:  [125 lb 14.1 oz (57.1 kg)] 125 lb 14.1 oz (57.1 kg) (01/13 0604) Weight change:  Last BM Date: 08/22/14  Intake/Output from previous day: 01/12 0701 - 01/13 0700 In: 960 [P.O.:960] Out: 3450 [Urine:1250] Total I/O In: 243 [P.O.:240; I.V.:3] Out: -    Physical Exam: General- pt is awake,alert, oriented to time place and person Resp- No acute REsp distress,NO Rhonchi, PC in situ CVS- S1S2 regular in rate and rhythm GIT- BS+, soft, distended+  EXT- 1+ LE Edema,NO Cyanosis   Lab Results: CBC  Recent Labs  08/21/14 0736 08/22/14 0859  WBC 5.8 6.8  HGB 9.0* 8.5*  HCT 26.8* 25.8*  PLT 47* 29*    BMET  Recent Labs  08/21/14 0736 08/22/14 0859  NA 132* 131*  K 3.5 3.8  CL 96 97  CO2 25 27  GLUCOSE 95 130*  BUN 24* 22  CREATININE 3.36* 2.40*  CALCIUM 8.9 8.6   Trend Creat 2016  4.89=>4.53=>5.529 (Initiated on HD 08/18/14 sec to Anasarca) 2015  2.15=>4.13=>1.68=>1.59=>2.12( SEpt admission)           1.15=>1.95=>1.19 ( July admission)  2.3==>1.21=> 1.08 ( April admission)           3.11==>1.36==>1.18 ( February admission)  MICRO Recent Results (from the past 240 hour(s))  Body fluid culture     Status: None   Collection Time: 08/16/14 11:10 AM  Result Value Ref Range Status   Specimen Description ASCITIC FLUID  Final   Special Requests NONE  Final   Gram Stain   Final    RARE WBC PRESENT,BOTH PMN AND MONONUCLEAR NO ORGANISMS SEEN Performed at [facility]    Culture   Final    NO GROWTH 3 DAYS Performed at [facility]    Report Status 08/19/2014 FINAL  Final      Lab Results  Component Value Date   CALCIUM 8.6 08/22/2014   PHOS 3.6 08/20/2014       Impression: 1)Renal  CKD stage 3=> Much worsening Now on HD since   08/18/14               CKD sec to Multiple AKI               CKD since 2015  Pt last HD was yesterday                 NO need of HD today              2)CVS- Hemodynamically stable  3)Anemia HGb  Stable Recieved IV iron  4)Liver- 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  Jailynn Lavalais S 08/22/2014, 10:10 AM

## 2014-08-22 NOTE — Progress Notes (Signed)
Wanda [name]   DOB:[birth year]   OE#:[id number]    Subjective: Doing OK, Sitting up. She reports a lot of bleeding around the dialysis catheter. Tearful. Requests something for sleep  Objective:  Filed Vitals:   08/22/14 1325  BP: 96/46  Pulse: 70  Temp: 98.1 F (36.7 C)  Resp: 18     Intake/Output Summary (Last 24 hours) at 08/22/14 1717 Last data filed at 08/22/14 1453  Gross per 24 hour  Intake    723 ml  Output   1000 ml  Net   -277 ml    GENERAL:alert, no distress  SKIN: skin color, texture, turgor are normal, no rashes or significant lesions EYES: normal, Conjunctiva are pink and non-injected, sclera clear OROPHARYNX:no exudate, no erythema and lips, buccal mucosa, and tongue normal  NECK: supple, thyroid normal size, non-tender, without nodularity LYMPH:  no palpable lymphadenopathy in the cervical, axillary or inguinal LUNGS: clear to auscultation and percussion with normal breathing effort HEART: regular rate & rhythm and no murmurs and no lower extremity edema ABDOMEN:abdomen soft,distended Musculoskeletal:no cyanosis of digits and no clubbing  NEURO: alert & oriented x 3 with fluent speech, no focal motor/sensory deficits   Labs:  Lab Results  Component Value Date   WBC 6.8 08/22/2014   HGB 8.5* 08/22/2014   HCT 25.8* 08/22/2014   MCV 98.9 08/22/2014   PLT 29* 08/22/2014   NEUTROABS 5.4 08/13/2014    Lab Results  Component Value Date   NA 131* 08/22/2014   K 3.8 08/22/2014   CL 97 08/22/2014   CO2 27 08/22/2014    Studies:  No results found. Heparin Induced Plt Ab  REPORT   Comments: (NOTE)  Heparin Induced Plt Ab   Weak Positive **          Reference range: Negative  This test is approximately 95% sensitive for detecting  antibodies to the Platelet Factor 4/Heparin Complex  (PF4/Heparin). A positive test does not necessarily  predict thrombosis or thrombocytopenia, since only a  minority of PF4/Heparin antibodies activate  platelets.  Specificity improves when the O.D. is >1.000 and when  a clinical risk assessment score is applied [employer]  Haemost 2008;6:1304-12). The Serotonin Release Assay  (SRA) may provide additional value as a positive result  has a higher predictive value for thrombocytopenia-  thrombosis.     Patient O.D.  0.307   Comments: (NOTE)  ______________________________________  !  O.D.units    !  Result    !  !____________________!_________________!  ! <=0.300      !  Negative   !  ! >0.300 to <=0.500 ! Weak Positive !  ! >0.500      !  Positive   !  !____________________!_________________!  For more information on this test, go to:  http://education.questdiagnostics.com/faq/FAQ06V1     UFH SRA Result Negative  Negative   Comments: (NOTE)  A sample is considered negative if there is  <20% release.  The SRA has a sensitivity (88-100%) and specificity  (89-100%) for Heparin Induced Thrombocytopenia (HIT)  Rondel Baton, Hematology Am Soc Hematol Educ  Program. 2009; 225-232).     UFH Low Dose 0.1 IU/mL % Release 0   UFH Low Dose 0.5 IU/mL % Release 0   UFH High Dose UFH H % Release 0     Assessment & Plan:  Thrombocytopenia Cirrhosis secondary to NASH, s/p transplant eval at [facility] Acute on CKD/ dialysis Ascites Variceal bleeding Platelet count 251K at [facility] 08/13/2014 Hep C negative,  HIV negative on 12/15/8467 History of folic acid deficiency  Given intermediate pre-test probability of HIT, weak positive ELISA and NEGATIVE SRA...she does NOT have HIT as the cause of her acute thrombocytopenia.  May be medication induced and would for now continue to hold demedex and aldactone.  I would also try a trial of prednisone to see if they improve. Give platelets for a platelet count of less than 20K or if she has bleeding.  Will continue to follow.  Will start on folic acid again given recent recheck level on 08/21/14.  D/W Dr. Roderic Palau -- to  give dose platelets now given persistent oozing around catheter. He has aggreed to address insomnia.  Molli Hazard, MD 08/22/2014  5:17 PM

## 2014-08-22 NOTE — Progress Notes (Signed)
[facility] PROGRESS NOTE  Wanda [name] PPH:[id number] DOB: 05-26-1976 DOA: 08/13/2014 PCP: [place]  Assessment/Plan:  Acute renal failure superimposed on chronic kidney disease stage III. Associated anasarca. Differential includes prerenal, ATN, hepatorenal. Urine output improved. Creatinine improved this am. Dialysis scheduled for tomorrow  Metabolic acidosis secondary to acute renal failure, continue oral bicarbonate. Resolved status post dialysis.  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 Being evaluated at [facility] for transplant.   Anemia of chronic disease with superimposed anemia of acute illness/renal failure. No evidence of bleeding. S/p total 2 units PRBC's since admission. Hg 8.5 today.   Thrombocytopenia. Acute on chronic. Trending down today. Evaluated by heme who opine likely drug induced and recommended discontinuing demadex/aldactone. Not on any anticoagulants. 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  Having a continuous ooze of blood from HD cath which has not abated.continue pressure dressing  Chronic hypotension, stable. Continue midodrine - dose changed to 10 mg TID (was 15 BID). Secondary to cirrhosis. Remains asymptomatic.  Possible SBP. Considered on admission, it does appear at this point the patient's pain is more chronic.Diagnostic paracentesis did not suggest SBP and culture no growth, final. Rocephin discontinued 08/20/14.    Code Status: full Family Communication: none Disposition Plan:    Consultants:  Nephrology  heme   Procedures:  Diagnostic paracentesis 1/7  1/8 transfusion one unit packed red blood cells  1/9 tunneled HD catheter placement  1/10 transfusion one unit packed red blood cells  Antibiotics: Rocephin 1/12/08/09/16 HPI/Subjective: Denies pain/discomfort. Reports continued bleeding from catherter  site  Objective: Filed Vitals:   08/22/14 1325  BP: 96/46  Pulse: 70  Temp: 98.1 F (36.7 C)  Resp: 18    Intake/Output Summary (Last 24 hours) at 08/22/14 1659 Last data filed at 08/22/14 1453  Gross per 24 hour  Intake    723 ml  Output   1000 ml  Net   -277 ml   Filed Weights   08/21/14 0705 08/21/14 0855 08/22/14 0604  Weight: 57.244 kg (126 lb 3.2 oz) 57.2 kg (126 lb 1.7 oz) 57.1 kg (125 lb 14.1 oz)    Exam:   General: chronically ill appearing  Cardiovascular: RRR trace LE edema no MGR   Respiratory: normal effort somewhat shallow no wheeze  Abdomen: mildly distended but soft +BS mild diffuse tenderness umbilical hernia  Musculoskeletal: joints without swelling/cyanosis   Data Reviewed: Basic Metabolic Panel:  Recent Labs Lab 08/18/14 0549 08/19/14 0545 08/20/14 0702 08/21/14 0736 08/22/14 0859  NA 135 135 135 132* 131*  K 3.5 3.5 3.9 3.5 3.8  CL 103 101 101 96 97  CO2 18* 23 24 25 27   GLUCOSE 96 109* 108* 95 130*  BUN 70* 45* 19 24* 22  CREATININE 5.52* 4.51* 2.65* 3.36* 2.40*  CALCIUM 8.3* 8.4 8.4 8.9 8.6  PHOS  --  6.5* 3.6  --   --    Liver Function Tests:  Recent Labs Lab 08/18/14 1226  ALT 11   No results for input(s): LIPASE, AMYLASE in the last 168 hours. No results for input(s): AMMONIA in the last 168 hours. CBC:  Recent Labs Lab 08/18/14 0549 08/19/14 0545 08/20/14 0702 08/21/14 0736 08/22/14 0859  WBC 6.9 6.0 9.8 5.8 6.8  HGB 8.5* 7.8* 8.8* 9.0* 8.5*  HCT 24.5* 22.7* 25.7* 26.8* 25.8*  MCV 95.0 95.8 94.8 96.4 98.9  PLT 115* 76* 39* 47* 29*   Cardiac Enzymes: No  results for input(s): CKTOTAL, CKMB, CKMBINDEX, TROPONINI in the last 168 hours. BNP (last 3 results) No results for input(s): PROBNP in the last 8760 hours. CBG: No results for input(s): GLUCAP in the last 168 hours.  Recent Results (from the past 240 hour(s))  Body fluid culture     Status: None   Collection Time: 08/16/14 11:10 AM  Result Value Ref  Range Status   Specimen Description ASCITIC FLUID  Final   Special Requests NONE  Final   Gram Stain   Final    RARE WBC PRESENT,BOTH PMN AND MONONUCLEAR NO ORGANISMS SEEN Performed at [facility]    Culture   Final    NO GROWTH 3 DAYS Performed at [facility]    Report Status 08/19/2014 FINAL  Final     Studies: No results found.  Scheduled Meds: . albumin human  25 g Intravenous Once  . ferric gluconate (FERRLECIT/NULECIT) IV  125 mg Intravenous Q T,Th,Sa-HD  . midodrine  10 mg Oral TID WC  . ondansetron (ZOFRAN) IV  4 mg Intravenous Q4H  . pantoprazole  40 mg Oral Daily  . sodium chloride  3 mL Intravenous Q12H   Continuous Infusions:   Principal Problem:   Acute renal failure Active Problems:   Anasarca   Anemia   Liver cirrhosis secondary to NASH   Renal failure   Metabolic acidosis   Hyponatremia   Thrombocytopenia   Absolute anemia    Time spent: [facility] . If 7PM-7AM, please contact night-coverage at [email or web address], password [facility] 08/22/2014, 4:59 PM  LOS: 9 days

## 2014-08-22 NOTE — Progress Notes (Signed)
CRITICAL VALUE ALERT  Critical value received:  Platelet count of 29,000  Date of notification:  08/22/2014  Time of notification:  0943  Critical value read back: yes t Nurse who received alert:  Vista Deck  MD notified (1st page):  Notified Dr. Roderic Palau  Responding MD:  Dr. Roderic Palau. No new orders at this time.  Time MD responded:  [phone]

## 2014-08-22 NOTE — Progress Notes (Signed)
Received verbal report from Leda Quail R.N. Oncology that she had received a critical lab value for Wanda [name] called in by Florentina Jenny, [facility].  Critical lab value result @ 1630 08/22/2014 HIT PANEL= WEAK POSITIVE. MD. Notified.  No further orders at this time.

## 2014-08-22 NOTE — Clinical Social Work Note (Signed)
CSW discussed dialysis with pt. She is aware it will be Tuesday, Thursday, Saturday schedule at 10:45. Pt plans to talk with family/friends to arrange transportation. She feels that during the week it will be fine, but she may have more difficulty on Saturdays. Pt aware of transportation services, but is not interested. CSW will notify [facility] when pt is d/c.  Benay Pike, [place]

## 2014-08-23 LAB — BASIC METABOLIC PANEL
ANION GAP: 9 (ref 5–15)
BUN: 33 mg/dL — ABNORMAL HIGH (ref 6–23)
CO2: 25 mmol/L (ref 19–32)
Calcium: 9.5 mg/dL (ref 8.4–10.5)
Chloride: 99 mEq/L (ref 96–112)
Creatinine, Ser: 3 mg/dL — ABNORMAL HIGH (ref 0.50–1.10)
GFR calc non Af Amer: 18 mL/min — ABNORMAL LOW (ref >90)
GFR, EST AFRICAN AMERICAN: 21 mL/min — AB (ref >90)
Glucose, Bld: 125 mg/dL — ABNORMAL HIGH (ref 70–99)
POTASSIUM: 4.7 mmol/L (ref 3.5–5.1)
SODIUM: 133 mmol/L — AB (ref 135–145)

## 2014-08-23 LAB — CBC
HEMATOCRIT: 26 % — AB (ref 36.0–46.0)
Hemoglobin: 8.6 g/dL — ABNORMAL LOW (ref 12.0–15.0)
MCH: 32.6 pg (ref 26.0–34.0)
MCHC: 33.1 g/dL (ref 30.0–36.0)
MCV: 98.5 fL (ref 78.0–100.0)
PLATELETS: 77 10*3/uL — AB (ref 150–400)
RBC: 2.64 MIL/uL — AB (ref 3.87–5.11)
RDW: 22 % — AB (ref 11.5–15.5)
WBC: 6.6 10*3/uL (ref 4.0–10.5)

## 2014-08-23 LAB — PREPARE PLATELET PHERESIS: UNIT DIVISION: 0

## 2014-08-23 MED ORDER — ALBUMIN HUMAN 25 % IV SOLN
25.0000 g | Freq: Once | INTRAVENOUS | Status: AC
  Administered 2014-08-23: 25 g via INTRAVENOUS
  Filled 2014-08-23: qty 100

## 2014-08-23 MED ORDER — EPOETIN ALFA 10000 UNIT/ML IJ SOLN
10000.0000 [IU] | INTRAMUSCULAR | Status: DC
  Administered 2014-08-25 – 2014-08-27 (×2): 10000 [IU] via INTRAVENOUS
  Filled 2014-08-23 (×3): qty 1

## 2014-08-23 NOTE — Progress Notes (Signed)
[facility] PROGRESS NOTE  Wanda [name] KDX:[id number] DOB: Sep 05, 1975 DOA: 08/13/2014 PCP: [place]  Assessment/Plan:  Acute renal failure superimposed on chronic kidney disease stage III. Associated anasarca. Worsening. Dialysis scheduled today. Appreciate assistance nephrology. OP dialysis has been set up.   Metabolic acidosis secondary to acute renal failure, continue oral bicarbonate. Resolved status post dialysis.  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 Being evaluated at [facility] for [facility].   Anemia of chronic disease with superimposed anemia of acute illness/renal failure. Has had significant bleeding around dialysis catheter previous 2 days. S/p total 2 units PRBC's since admission. Hg stable.   Thrombocytopenia. Acute on chronic. Trending up today today s/p platelet transfusion and initiation of prednisone.  Evaluated by heme who opine likely drug induced and recommended discontinuing demadex/aldactone. Not on any anticoagulants.   Chronic hypotension, stable. Continue midodrine - dose changed to 10 mg TID (was 15 BID). Secondary to cirrhosis. Remains asymptomatic.  Possible SBP. Considered on admission, it does appear at this point the patient's pain is more chronic.Diagnostic paracentesis did not suggest SBP and culture no growth, final. Rocephin discontinued 08/20/14  Code Status: full Family Communication:  Disposition Plan: home hopefully 24-48 houres   Consultants:  Nephrology  heme  Procedures:  Diagnostic paracentesis 1/7  1/8 transfusion one unit packed red blood cells  1/9 tunneled HD catheter placement  1/10 transfusion one unit packed red blood cells  1/13 transfusion platelets  1/12 dialysis  1/14 dialysis  Antibiotics:  Rocephin 08/14/14- 08/20/14  HPI/Subjective: Denies pain/discomfort.   Objective: Filed  Vitals:   08/23/14 1350  BP: 97/49  Pulse: 68  Temp:   Resp:     Intake/Output Summary (Last 24 hours) at 08/23/14 1406 Last data filed at 08/23/14 0946  Gross per 24 hour  Intake    330 ml  Output    350 ml  Net    -20 ml   Filed Weights   08/22/14 0604 08/23/14 0630 08/23/14 1300  Weight: 57.1 kg (125 lb 14.1 oz) 58.968 kg (130 lb) 58.8 kg (129 lb 10.1 oz)    Exam:   General:  Somewhat frail and chronically ill appearing  Cardiovascular: RRR 1+LE edema   Respiratory: normal effort BS clear but distant no wheeze  Abdomen: mildly distended  Soft ?fluid wave, umbilical hernia mild tenderness to palpation  Musculoskeletal: no clubbing or cyanosis   Data Reviewed: Basic Metabolic Panel:  Recent Labs Lab 08/19/14 0545 08/20/14 0702 08/21/14 0736 08/22/14 0859 08/23/14 0616  NA 135 135 132* 131* 133*  K 3.5 3.9 3.5 3.8 4.7  CL 101 101 96 97 99  CO2 23 24 25 27 25   GLUCOSE 109* 108* 95 130* 125*  BUN 45* 19 24* 22 33*  CREATININE 4.51* 2.65* 3.36* 2.40* 3.00*  CALCIUM 8.4 8.4 8.9 8.6 9.5  PHOS 6.5* 3.6  --   --   --    Liver Function Tests:  Recent Labs Lab 08/18/14 1226  ALT 11   No results for input(s): LIPASE, AMYLASE in the last 168 hours. No results for input(s): AMMONIA in the last 168 hours. CBC:  Recent Labs Lab 08/19/14 0545 08/20/14 0702 08/21/14 0736 08/22/14 0859 08/23/14 0616  WBC 6.0 9.8 5.8 6.8 6.6  HGB 7.8* 8.8* 9.0* 8.5* 8.6*  HCT 22.7* 25.7* 26.8* 25.8* 26.0*  MCV 95.8 94.8 96.4 98.9 98.5  PLT 76* 39* 47* 29* 77*  Cardiac Enzymes: No results for input(s): CKTOTAL, CKMB, CKMBINDEX, TROPONINI in the last 168 hours. BNP (last 3 results) No results for input(s): PROBNP in the last 8760 hours. CBG: No results for input(s): GLUCAP in the last 168 hours.  Recent Results (from the past 240 hour(s))  Body fluid culture     Status: None   Collection Time: 08/16/14 11:10 AM  Result Value Ref Range Status   Specimen Description  ASCITIC FLUID  Final   Special Requests NONE  Final   Gram Stain   Final    RARE WBC PRESENT,BOTH PMN AND MONONUCLEAR NO ORGANISMS SEEN Performed at [facility]    Culture   Final    NO GROWTH 3 DAYS Performed at [facility]    Report Status 08/19/2014 FINAL  Final     Studies: No results found.  Scheduled Meds: . albumin human  25 g Intravenous Once  . [START ON 08/24/2014] epoetin (EPOGEN/PROCRIT) injection  10,000 Units Intravenous Q M,W,F-HD  . ferric gluconate (FERRLECIT/NULECIT) IV  125 mg Intravenous Q T,Th,Sa-HD  . folic acid  1 mg Oral Daily  . midodrine  10 mg Oral TID WC  . ondansetron (ZOFRAN) IV  4 mg Intravenous Q4H  . pantoprazole  40 mg Oral Daily  . predniSONE  60 mg Oral Q breakfast  . sodium chloride  3 mL Intravenous Q12H   Continuous Infusions:   Principal Problem:   Acute renal failure Active Problems:   Anasarca   Anemia   Liver cirrhosis secondary to NASH   Renal failure   Metabolic acidosis   Hyponatremia   Thrombocytopenia   Absolute anemia    Time spent: 30 minutes    [facility] Pager [phone]. If 7PM-7AM, please contact night-coverage at [email or web address], password [facility] 08/23/2014, 2:06 PM  LOS: 10 days

## 2014-08-23 NOTE — Progress Notes (Signed)
Patient arrived back from dialysis. Patient was alert and oriented and had no complaints. Received report from [place], Therapist, sports. Patients dialysis catheter was clean, dry, and intact. Will continue to monitor patient at this time.

## 2014-08-23 NOTE — Progress Notes (Deleted)
Subjective: edema Interval History: Feeling slightly better. She didn't have any nausea or vomiting. Her appetite is slightly better.  Objective: Vital signs in last 24 hours: Temp:  [98.1 F (36.7 C)-98.7 F (37.1 C)] 98.2 F (36.8 C) (01/14 0630) Pulse Rate:  [54-76] 68 (01/14 0630) Resp:  [18-20] 20 (01/14 0630) BP: (96-137)/(46-73) 137/73 mmHg (01/14 0630) SpO2:  [100 %] 100 % (01/14 0630) Weight:  [58.968 kg (130 lb)] 58.968 kg (130 lb) (01/14 0630) Weight change: 1.724 kg (3 lb 12.8 oz)  Intake/Output from previous day: 01/13 0701 - 01/14 0700 In: 570 [P.O.:240; I.V.:3; Blood:327] Out: 450 [Urine:450] Intake/Output this shift: Total I/O In: 3 [I.V.:3] Out: -   Generally she is alert and in no apparent distress Chest decreased breath sounds bilaterally Heart exam reveals regular rate and rhythm no murmur Abdomen: Full but nontender Extremities she has 1+ edema on the right and trace edema on the left.  Lab Results:  Recent Labs  08/22/14 0859 08/23/14 0616  WBC 6.8 6.6  HGB 8.5* 8.6*  HCT 25.8* 26.0*  PLT 29* 77*   BMET:   Recent Labs  08/22/14 0859 08/23/14 0616  NA 131* 133*  K 3.8 4.7  CL 97 99  CO2 27 25  GLUCOSE 130* 125*  BUN 22 33*  CREATININE 2.40* 3.00*  CALCIUM 8.6 9.5   No results for input(s): PTH in the last 72 hours. Iron Studies:  No results for input(s): IRON, TIBC, TRANSFERRIN, FERRITIN in the last 72 hours.  Studies/Results: No results found.  I have reviewed the patient's current medications.  Assessment/Plan: Problem #1 acute on chronic renal failure: She is status post  dialysis the day before  yesterday. No new complaints. Problem #2 anasarca : . Presently she is on high dose of Demadex and also ultrafiltration with dialysis. Patient had 1200 mL of urine and we are able to get another 2200 cc with dialysis. Overall shows about 2500 mL negative. Problem #3 anemia: Her hemoglobin is low but stable.. Presently patient is on  IV iron and also Epogen. Problem #4 liver cirrhosis Problem #5 bipolar disorder Problem #6 metabolic acidosis: Has corrected. Problem #7 metabolic or disease her calcium and phosphorus is range. Presently she is on a binder. Plan: 1] We'll continue patient on Demadex 3]We'll continue his dialysis as her creatinine still increasing.  4]We'll try to remove about 2 L if her blood pressure tolerates. 5]We'll continue with IV iron and Epogen. 6]We'll check her basic metabolic panel and CBC in the morning 7]We'll try to make outpatient arrangement for dialysis when patient is discharged.    LOS: 10 days   Aidan Caloca S 08/23/2014,10:30 AM

## 2014-08-23 NOTE — Care Management Utilization Note (Signed)
UR completed 

## 2014-08-24 LAB — BASIC METABOLIC PANEL
Anion gap: 7 (ref 5–15)
BUN: 24 mg/dL — ABNORMAL HIGH (ref 6–23)
CHLORIDE: 101 meq/L (ref 96–112)
CO2: 27 mmol/L (ref 19–32)
Calcium: 9.3 mg/dL (ref 8.4–10.5)
Creatinine, Ser: 2.09 mg/dL — ABNORMAL HIGH (ref 0.50–1.10)
GFR calc Af Amer: 33 mL/min — ABNORMAL LOW (ref >90)
GFR calc non Af Amer: 29 mL/min — ABNORMAL LOW (ref >90)
Glucose, Bld: 96 mg/dL (ref 70–99)
POTASSIUM: 4.6 mmol/L (ref 3.5–5.1)
SODIUM: 135 mmol/L (ref 135–145)

## 2014-08-24 LAB — PHOSPHORUS: PHOSPHORUS: 3.3 mg/dL (ref 2.3–4.6)

## 2014-08-24 LAB — CBC
HEMATOCRIT: 25.8 % — AB (ref 36.0–46.0)
Hemoglobin: 8.4 g/dL — ABNORMAL LOW (ref 12.0–15.0)
MCH: 32.3 pg (ref 26.0–34.0)
MCHC: 32.6 g/dL (ref 30.0–36.0)
MCV: 99.2 fL (ref 78.0–100.0)
Platelets: 62 10*3/uL — ABNORMAL LOW (ref 150–400)
RBC: 2.6 MIL/uL — ABNORMAL LOW (ref 3.87–5.11)
RDW: 22.2 % — AB (ref 11.5–15.5)
WBC: 14.6 10*3/uL — ABNORMAL HIGH (ref 4.0–10.5)

## 2014-08-24 MED ORDER — OXYCODONE HCL 5 MG PO TABS
10.0000 mg | ORAL_TABLET | ORAL | Status: DC | PRN
  Administered 2014-08-25 – 2014-08-28 (×15): 10 mg via ORAL
  Filled 2014-08-24 (×16): qty 2

## 2014-08-24 NOTE — Plan of Care (Signed)
Problem: Food- and Nutrition-Related Knowledge Deficit (NB-1.1) Goal: Nutrition education Formal process to instruct or train a patient/client in a skill or to impart knowledge to help patients/clients voluntarily manage or modify food choices and eating behavior to maintain or improve health. Outcome: Adequate for Discharge Nutrition Education Note  RD consulted for New Renal Education. Provided "Healthy Food Guide for People on Dialysis" booklet to patient. Reviewed food groups and provided written recommended serving sizes specifically determined for patient's current nutritional status.   Explained why diet restrictions are needed and provided lists of foods to limit/avoid that are high potassium, sodium, and phosphorus. Provided specific recommendations on safer alternatives of these foods. Strongly encouraged compliance of this diet.   Provided examples of how to meet protein intake goals throughout the day. Discussed need for fluid restriction with dialysis, importance of minimizing weight gain between HD treatments, and renal-friendly beverage options.  Encouraged pt to discuss specific diet questions/concerns with RD at HD outpatient facility. Teach back method used.  Expect fair compliance.  Body mass index is 23.77 kg/(m^2). Pt meets criteria for normal based on current BMI.  Current diet order is heart healthy, patient is consuming approximately 50% of meals at this time. Labs and medications reviewed. No further nutrition interventions warranted at this time. RD contact information provided. If additional nutrition issues arise, please re-consult RD.  Colman Cater MS,RD,CSG,LDN Office: [phone] Pager: [phone]

## 2014-08-24 NOTE — Clinical Social Work Note (Signed)
CSW discussed pt with MD. Harrison Mons at [facility] and notified her that pt will need to remain in hospital over weekend. Will update again on Monday.   Benay Pike, [place]

## 2014-08-24 NOTE — Progress Notes (Signed)
[facility] PROGRESS NOTE  Wanda [name] ZDG:[id number] DOB: [birth year] DOA: 08/13/2014 PCP: [facility]  Assessment/Plan:  Acute renal failure superimposed on chronic kidney disease stage III. Associated anasarca that is gradually worsening. Dialysis scheduled tomorrow. Able to remove fluid during dialysis yesterday.  Appreciate assistance nephrology. OP dialysis has been set up.   Metabolic acidosis secondary to acute renal failure, continue oral bicarbonate. Resolved status post dialysis.  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 Being evaluated at [facility] for [facility].   Anemia of chronic disease with superimposed anemia of acute illness/renal failure. Stable. Bleeding around catheter site has stopped.  S/p total 2 units PRBC's since admission.   Thrombocytopenia. Acute on chronic. Trending down today today in spite of prednisone and  s/p platelet transfusion.. Evaluated by heme who opine likely drug induced and recommended discontinuing demadex/aldactone. Considering bone marrow biopsy. Not on any anticoagulants.   Chronic hypotension, much improve.  stable. Continue midodrine - dose changed to 10 mg TID (was 15 BID). Secondary to cirrhosis. Remains asymptomatic.  Possible SBP. Considered on admission, it does appear at this point the patient's pain is more chronic.Diagnostic paracentesis did not suggest SBP and culture no growth, final. Rocephin discontinued 08/20/14   Code Status: full Family Communication: none present Disposition Plan: home when ready   Consultants:  Nephrology  heme  Procedures:  Diagnostic paracentesis 1/7  1/8 transfusion one unit packed red blood cells  1/9 tunneled HD catheter placement  1/10 transfusion one unit packed red blood cells  1/13 transfusion platelets  1/12 dialysis  1/14  dialysis  Antibiotics:  Rocephin 08/14/14- 08/20/14  HPI/Subjective: Sitting up eating breakfast. No new complaints  Objective: Filed Vitals:   08/24/14 1538  BP: 115/73  Pulse: 66  Temp: 98.8 F (37.1 C)  Resp: 16    Intake/Output Summary (Last 24 hours) at 08/24/14 1608 Last data filed at 08/23/14 1745  Gross per 24 hour  Intake    350 ml  Output   2409 ml  Net  -2059 ml   Filed Weights   08/23/14 1300 08/23/14 1635 08/24/14 0644  Weight: 58.8 kg (129 lb 10.1 oz) 56.6 kg (124 lb 12.5 oz) 58.968 kg (130 lb)    Exam:   General:  Chronically ill appearing appears comfortable  Cardiovascular: RRR 1-2+LE edema  Respiratory: Normal effort with clear breath sounds bilaterally to auscultation and hear no wheeze no crackles  Abdomen: Mild distention somewhat firm umbilical hernia, diffuse tenderness to palpation  Musculoskeletal: Joints without swelling or erythema  Data Reviewed: Basic Metabolic Panel:  Recent Labs Lab 08/19/14 0545 08/20/14 0702 08/21/14 0736 08/22/14 0859 08/23/14 0616 08/24/14 0611  NA 135 135 132* 131* 133* 135  K 3.5 3.9 3.5 3.8 4.7 4.6  CL 101 101 96 97 99 101  CO2 _0 GLUCOSE 109* 108* 95 130* 125* 96  BUN 45* 19 24* 22 33* 24*  CREATININE 4.51* 2.65* 3.36* 2.40* 3.00* 2.09*  CALCIUM 8.4 8.4 8.9 8.6 9.5 9.3  PHOS 6.5* 3.6  --   --   --  3.3   Liver Function Tests:  Recent Labs Lab 08/18/14 1226  ALT 11   No results for input(s): LIPASE, AMYLASE in the last 168 hours. No results for input(s): AMMONIA in the last 168 hours. CBC:  Recent Labs Lab 08/20/14 9518 08/21/14 8416 08/22/14 6063 08/23/14 0160 08/24/14 1093  WBC 9.8 5.8 6.8 6.6 14.6*  HGB 8.8* 9.0* 8.5* 8.6* 8.4*  HCT 25.7* 26.8* 25.8* 26.0* 25.8*  MCV 94.8 96.4 98.9 98.5 99.2  PLT 39* 47* 29* 77* 62*   Cardiac Enzymes: No results for input(s): CKTOTAL, CKMB, CKMBINDEX, TROPONINI in the last 168 hours. BNP (last 3 results) No results for  input(s): PROBNP in the last 8760 hours. CBG: No results for input(s): GLUCAP in the last 168 hours.  Recent Results (from the past 240 hour(s))  Body fluid culture     Status: None   Collection Time: 08/16/14 11:10 AM  Result Value Ref Range Status   Specimen Description ASCITIC FLUID  Final   Special Requests NONE  Final   Gram Stain   Final    RARE WBC PRESENT,BOTH PMN AND MONONUCLEAR NO ORGANISMS SEEN Performed at [facility]    Culture   Final    NO GROWTH 3 DAYS Performed at [facility]    Report Status 08/19/2014 FINAL  Final     Studies: No results found.  Scheduled Meds: . epoetin (EPOGEN/PROCRIT) injection  10,000 Units Intravenous Q M,W,F-HD  . ferric gluconate (FERRLECIT/NULECIT) IV  125 mg Intravenous Q T,Th,Sa-HD  . folic acid  1 mg Oral Daily  . midodrine  10 mg Oral TID WC  . ondansetron (ZOFRAN) IV  4 mg Intravenous Q4H  . pantoprazole  40 mg Oral Daily  . predniSONE  60 mg Oral Q breakfast  . sodium chloride  3 mL Intravenous Q12H   Continuous Infusions:   Principal Problem:   Acute renal failure Active Problems:   Anasarca   Anemia   Liver cirrhosis secondary to NASH   Renal failure   Metabolic acidosis   Hyponatremia   Thrombocytopenia   Absolute anemia    Time spent: 35 minutes    BLACK,KAREN M  [facility] Pager [phone]. If 7PM-7AM, please contact night-coverage at [email or web address], password [email or web address] 08/24/2014, 4:08 PM  LOS: 11 days              

## 2014-08-24 NOTE — Progress Notes (Signed)
Subjective: edema Interval History: Not feeling good. Patient complains of feeling tired and weak. Her appetite is better.  Objective: Vital signs in last 24 hours: Temp:  [97.7 F (36.5 C)-98 F (36.7 C)] 98 F (36.7 C) (01/15 0644) Pulse Rate:  [59-92] 92 (01/15 0644) Resp:  [15-20] 17 (01/15 0644) BP: (87-141)/(46-86) 111/78 mmHg (01/15 0644) SpO2:  [99 %-100 %] 100 % (01/15 0644) Weight:  [56.6 kg (124 lb 12.5 oz)-58.968 kg (130 lb)] 58.968 kg (130 lb) (01/15 0644) Weight change: -0.168 kg (-5.9 oz)  Intake/Output from previous day: 01/14 0701 - 01/15 0700 In: 568 [P.O.:720; I.V.:3; IV Piggyback:110] Out: 2409 [Urine:200] Intake/Output this shift:    Generally she is alert and in no apparent distress Chest decreased breath sounds bilaterally Heart exam reveals regular rate and rhythm no murmur Abdomen: Full but nontender Extremities she has 1+ edema on the right and trace edema on the left.  Lab Results:  Recent Labs  08/23/14 0616 08/24/14 0611  WBC 6.6 14.6*  HGB 8.6* 8.4*  HCT 26.0* 25.8*  PLT 77* 62*   BMET:   Recent Labs  08/23/14 0616 08/24/14 0611  NA 133* 135  K 4.7 4.6  CL 99 101  CO2 25 27  GLUCOSE 125* 96  BUN 33* 24*  CREATININE 3.00* 2.09*  CALCIUM 9.5 9.3   No results for input(s): PTH in the last 72 hours. Iron Studies:  No results for input(s): IRON, TIBC, TRANSFERRIN, FERRITIN in the last 72 hours.  Studies/Results: No results found.  I have reviewed the patient's current medications.  Assessment/Plan: Problem #1 acute on chronic renal failure: She is status post hemodialysis yesterday. Her appetite is getting better. Problem #2 anasarca : Progressively improving.. Problem #3 anemia: Her hemoglobin is low but stable Problem #4 liver cirrhosis Problem #5 bipolar disorder Problem #6 metabolic acidosis: Has corrected. Problem #7 metabolic or disease her calcium and phosphorus is range. Presently she is on a binder. Problem #8  thrombocytopenia: Patient is being followed by hematology. Plan: 1] We'll continue patient on Demadex 2]We'll make arrangements for patient to get dialysis tomorrow.    LOS: 11 days   Serina Nichter S 08/24/2014,9:38 AM

## 2014-08-25 LAB — BASIC METABOLIC PANEL
Anion gap: 9 (ref 5–15)
BUN: 39 mg/dL — AB (ref 6–23)
CALCIUM: 9.5 mg/dL (ref 8.4–10.5)
CO2: 25 mmol/L (ref 19–32)
Chloride: 98 mEq/L (ref 96–112)
Creatinine, Ser: 2.7 mg/dL — ABNORMAL HIGH (ref 0.50–1.10)
GFR calc non Af Amer: 21 mL/min — ABNORMAL LOW (ref >90)
GFR, EST AFRICAN AMERICAN: 24 mL/min — AB (ref >90)
Glucose, Bld: 95 mg/dL (ref 70–99)
POTASSIUM: 4.9 mmol/L (ref 3.5–5.1)
Sodium: 132 mmol/L — ABNORMAL LOW (ref 135–145)

## 2014-08-25 LAB — CBC
HEMATOCRIT: 24.6 % — AB (ref 36.0–46.0)
Hemoglobin: 8.2 g/dL — ABNORMAL LOW (ref 12.0–15.0)
MCH: 33.1 pg (ref 26.0–34.0)
MCHC: 33.3 g/dL (ref 30.0–36.0)
MCV: 99.2 fL (ref 78.0–100.0)
Platelets: 77 10*3/uL — ABNORMAL LOW (ref 150–400)
RBC: 2.48 MIL/uL — ABNORMAL LOW (ref 3.87–5.11)
RDW: 22.2 % — AB (ref 11.5–15.5)
WBC: 12.3 10*3/uL — AB (ref 4.0–10.5)

## 2014-08-25 MED ORDER — SODIUM CHLORIDE 0.9 % IJ SOLN
INTRAMUSCULAR | Status: AC
  Administered 2014-08-25: 10 mL
  Filled 2014-08-25: qty 12

## 2014-08-25 MED ORDER — HEPARIN SODIUM (PORCINE) 1000 UNIT/ML DIALYSIS
1000.0000 [IU] | INTRAMUSCULAR | Status: DC | PRN
  Filled 2014-08-25: qty 1

## 2014-08-25 MED ORDER — SODIUM CHLORIDE 0.9 % IV SOLN: 100.0000 mL | INTRAVENOUS | Status: DC | PRN

## 2014-08-25 MED ORDER — LIDOCAINE-PRILOCAINE 2.5-2.5 % EX CREA: 1.0000 "application " | TOPICAL_CREAM | CUTANEOUS | Status: DC | PRN

## 2014-08-25 MED ORDER — PENTAFLUOROPROP-TETRAFLUOROETH EX AERO
1.0000 | INHALATION_SPRAY | CUTANEOUS | Status: DC | PRN
Start: 2014-08-25 — End: 2014-08-28

## 2014-08-25 MED ORDER — LIDOCAINE HCL (PF) 1 % IJ SOLN: 5.0000 mL | INTRAMUSCULAR | Status: DC | PRN

## 2014-08-25 MED ORDER — ALTEPLASE 2 MG IJ SOLR
2.0000 mg | Freq: Once | INTRAMUSCULAR | Status: AC | PRN
  Filled 2014-08-25: qty 2

## 2014-08-25 MED ORDER — NEPRO/CARBSTEADY PO LIQD: 237.0000 mL | ORAL | Status: DC | PRN

## 2014-08-25 MED ORDER — ALBUMIN HUMAN 25 % IV SOLN
50.0000 g | Freq: Once | INTRAVENOUS | Status: AC
  Administered 2014-08-25: 50 g via INTRAVENOUS
  Filled 2014-08-25: qty 200

## 2014-08-25 MED ORDER — EPOETIN ALFA 10000 UNIT/ML IJ SOLN
INTRAMUSCULAR | Status: AC
  Administered 2014-08-25: 10000 [IU] via INTRAVENOUS
  Filled 2014-08-25: qty 1

## 2014-08-25 NOTE — Progress Notes (Signed)
Subjective: edema Interval History: Still complains of feeling weak. There is no significant change with her appetite. She state that since she has edema of her leg. Patient denies any difficulty breathing and no nausea or vomiting.  Objective: Vital signs in last 24 hours: Temp:  [98.4 F (36.9 C)-98.8 F (37.1 C)] 98.4 F (36.9 C) (01/16 0537) Pulse Rate:  [60-99] 99 (01/16 0537) Resp:  [16-18] 18 (01/16 0537) BP: (115-142)/(73-93) 133/93 mmHg (01/16 0537) SpO2:  [93 %-100 %] 93 % (01/16 0537) Weight:  [57.9 kg (127 lb 10.3 oz)] 57.9 kg (127 lb 10.3 oz) (01/16 0537) Weight change: -0.9 kg (-1 lb 15.8 oz)  Intake/Output from previous day: 01/15 0701 - 01/16 0700 In: -  Out: 500 [Urine:500] Intake/Output this shift:    Generally she is alert and in no apparent distress Chest decreased breath sounds bilaterally Heart exam reveals regular rate and rhythm no murmur Abdomen: Full but nontender Extremities she has 1+ edema on the right and trace edema on the left.  Lab Results:  Recent Labs  08/24/14 0611 08/25/14 0545  WBC 14.6* 12.3*  HGB 8.4* 8.2*  HCT 25.8* 24.6*  PLT 62* 77*   BMET:   Recent Labs  08/24/14 0611 08/25/14 0545  NA 135 132*  K 4.6 4.9  CL 101 98  CO2 27 25  GLUCOSE 96 95  BUN 24* 39*  CREATININE 2.09* 2.70*  CALCIUM 9.3 9.5   No results for input(s): PTH in the last 72 hours. Iron Studies:  No results for input(s): IRON, TIBC, TRANSFERRIN, FERRITIN in the last 72 hours.  Studies/Results: No results found.  I have reviewed the patient's current medications.  Assessment/Plan: Problem #1 acute on chronic renal failure: Patient presently is asymptomatic. Her BUN and creatinine still increase this between dialysis days. He was on patient remains oliguric her urine output has declined. Problem #2 anasarca : Patient is still with ascites and edema however seems to be better. Problem #3 anemia: Her hemoglobin remains below target goal. Patient  presently on IV iron and Epogen during dialysis. Problem #4 liver cirrhosis Problem #5 bipolar disorder Problem #6 metabolic acidosis: Has corrected. Problem #7 metabolic or disease her calcium and phosphorus is range. Her binder has been discontinued. Problem #8 thrombocytopenia: Patient is being followed by hematology. Patient is not receiving any heparin because of thrombocytopenia. Plan: 1] We'll continue patient on Demadex 2]We'll make arrangements for patient to get dialysis today. 3] will use albumin 50 g 1 dose during dialysis.  4] we'll try to remove about 2 2/5 L if her blood pressure tolerates.    LOS: 12 days   Pearlena Ow S 08/25/2014,8:44 AM

## 2014-08-25 NOTE — Procedures (Signed)
   HEMODIALYSIS TREATMENT NOTE:  3.5 hour heparin-free dialysis completed via right IJ tunneled catheter.  Exit site with dried blood, not actively bleeding.  Goal met:  Tolerated removal of 2.5 liters.  Ultrafiltration was interrupted x15 minutes for use of bedside commode.  Hemodynamically stable with administration of Albumin 50g.  Nulecit and Epogen also given with HD.  All blood was reinfused.  Report called to Sharen Hones, RN.  Rhiann Boucher L. Sia Gabrielsen, RN, CDN

## 2014-08-25 NOTE — Progress Notes (Signed)
[facility] PROGRESS NOTE  Wanda [name] DGL:[id number] DOB: 19-Dec-1975 DOA: 08/13/2014 PCP: [place]  Assessment/Plan:  Acute renal failure superimposed on chronic kidney disease stage III. Associated anasarca that is gradually improving. Dialysis scheduled tomorrow. Able to remove fluid during dialysis yesterday.  Appreciate assistance nephrology. OP dialysis has been set up.   Metabolic acidosis secondary to acute renal failure, continue oral bicarbonate. Resolved status post dialysis.  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 Being evaluated at [facility] for transplant.   Anemia of chronic disease with superimposed anemia of acute illness/renal failure. Stable. Bleeding around catheter site has stopped.  S/p total 2 units PRBC's since admission.   Thrombocytopenia. Acute on chronic. She is s/p transfusion of platelets. Platelet count may be stabilizing today. Evaluated by heme who opine likely drug induced and recommended discontinuing demadex/aldactone. Started on high dose prednisone. Considering bone marrow biopsy. Not on any anticoagulants.   Chronic hypotension, much improve.  stable. Continue midodrine - dose changed to 10 mg TID (was 15 BID). Secondary to cirrhosis. Remains asymptomatic.  Possible SBP. Considered on admission, it does appear at this point the patient's pain is more chronic.Diagnostic paracentesis did not suggest SBP and culture no growth, final. Rocephin discontinued 08/20/14   Code Status: full Family Communication: none present Disposition Plan: home when ready   Consultants:  Nephrology  heme  Procedures:  Diagnostic paracentesis 1/7  1/8 transfusion one unit packed red blood cells  1/9 tunneled HD catheter placement  1/10 transfusion one unit packed red blood cells  1/13 transfusion platelets  1/12 dialysis  1/14  dialysis  1/16 dialysis  Antibiotics:  Rocephin 08/14/14- 08/20/14  HPI/Subjective: No new complaints today. Tolerating diet. No shortness of breath  Objective: Filed Vitals:   08/25/14 1542  BP: 119/62  Pulse: 99  Temp: 98.3 F (36.8 C)  Resp: 20    Intake/Output Summary (Last 24 hours) at 08/25/14 1631 Last data filed at 08/25/14 1346  Gross per 24 hour  Intake    240 ml  Output   3000 ml  Net  -2760 ml   Filed Weights   08/24/14 0644 08/25/14 0537 08/25/14 1010  Weight: 58.968 kg (130 lb) 57.9 kg (127 lb 10.3 oz) 57.9 kg (127 lb 10.3 oz)    Exam:   General:  Chronically ill appearing appears comfortable  Cardiovascular: RRR 1-2+LE edema  Respiratory: Normal effort with clear breath sounds bilaterally to auscultation and hear no wheeze no crackles  Abdomen: Mild distention somewhat firm umbilical hernia, diffuse tenderness to palpation, mild  Musculoskeletal: Joints without swelling or erythema  Data Reviewed: Basic Metabolic Panel:  Recent Labs Lab 08/19/14 0545 08/20/14 0702 08/21/14 0736 08/22/14 0859 08/23/14 0616 08/24/14 0611 08/25/14 0545  NA 135 135 132* 131* 133* 135 132*  K 3.5 3.9 3.5 3.8 4.7 4.6 4.9  CL 101 101 96 97 99 101 98  CO2 23 24 25 27 25 27 25   GLUCOSE 109* 108* 95 130* 125* 96 95  BUN 45* 19 24* 22 33* 24* 39*  CREATININE 4.51* 2.65* 3.36* 2.40* 3.00* 2.09* 2.70*  CALCIUM 8.4 8.4 8.9 8.6 9.5 9.3 9.5  PHOS 6.5* 3.6  --   --   --  3.3  --    Liver Function Tests: No results for input(s): AST, ALT, ALKPHOS, BILITOT, PROT, ALBUMIN in the last 168 hours. No results for input(s): LIPASE, AMYLASE in the last  168 hours. No results for input(s): AMMONIA in the last 168 hours. CBC:  Recent Labs Lab 08/21/14 0736 08/22/14 0859 08/23/14 0616 08/24/14 0611 08/25/14 0545  WBC 5.8 6.8 6.6 14.6* 12.3*  HGB 9.0* 8.5* 8.6* 8.4* 8.2*  HCT 26.8* 25.8* 26.0* 25.8* 24.6*  MCV 96.4 98.9 98.5 99.2 99.2  PLT 47* 29* 77* 62* 77*    Cardiac Enzymes: No results for input(s): CKTOTAL, CKMB, CKMBINDEX, TROPONINI in the last 168 hours. BNP (last 3 results) No results for input(s): PROBNP in the last 8760 hours. CBG: No results for input(s): GLUCAP in the last 168 hours.  Recent Results (from the past 240 hour(s))  Body fluid culture     Status: None   Collection Time: 08/16/14 11:10 AM  Result Value Ref Range Status   Specimen Description ASCITIC FLUID  Final   Special Requests NONE  Final   Gram Stain   Final    RARE WBC PRESENT,BOTH PMN AND MONONUCLEAR NO ORGANISMS SEEN Performed at [facility]    Culture   Final    NO GROWTH 3 DAYS Performed at [facility]    Report Status 08/19/2014 FINAL  Final     Studies: No results found.  Scheduled Meds: . epoetin (EPOGEN/PROCRIT) injection  10,000 Units Intravenous Q M,W,F-HD  . ferric gluconate (FERRLECIT/NULECIT) IV  125 mg Intravenous Q T,Th,Sa-HD  . folic acid  1 mg Oral Daily  . midodrine  10 mg Oral TID WC  . ondansetron (ZOFRAN) IV  4 mg Intravenous Q4H  . pantoprazole  40 mg Oral Daily  . predniSONE  60 mg Oral Q breakfast  . sodium chloride  3 mL Intravenous Q12H   Continuous Infusions:   Principal Problem:   Acute renal failure Active Problems:   Anasarca   Anemia   Liver cirrhosis secondary to NASH   Renal failure   Metabolic acidosis   Hyponatremia   Thrombocytopenia   Absolute anemia    Time spent: 25 minutes    [facility] Pager [phone]. If 7PM-7AM, please contact night-coverage at [email or web address], password [email or web address] 08/25/2014, 4:31 PM  LOS: 12 days

## 2014-08-25 NOTE — Progress Notes (Signed)
Pt states she has been accidentally scratching herself. She believes she is doing this in here sleep. She scratched under her right breast and her chin. Areas were cleansed. Pt states she wants to rest because she is tired from dialysis.

## 2014-08-26 LAB — BASIC METABOLIC PANEL
Anion gap: 10 (ref 5–15)
BUN: 27 mg/dL — ABNORMAL HIGH (ref 6–23)
CALCIUM: 9.7 mg/dL (ref 8.4–10.5)
CHLORIDE: 99 meq/L (ref 96–112)
CO2: 26 mmol/L (ref 19–32)
Creatinine, Ser: 2.13 mg/dL — ABNORMAL HIGH (ref 0.50–1.10)
GFR calc Af Amer: 32 mL/min — ABNORMAL LOW (ref >90)
GFR calc non Af Amer: 28 mL/min — ABNORMAL LOW (ref >90)
Glucose, Bld: 110 mg/dL — ABNORMAL HIGH (ref 70–99)
POTASSIUM: 4.7 mmol/L (ref 3.5–5.1)
SODIUM: 135 mmol/L (ref 135–145)

## 2014-08-26 LAB — CBC
HCT: 25.4 % — ABNORMAL LOW (ref 36.0–46.0)
HEMOGLOBIN: 8.2 g/dL — AB (ref 12.0–15.0)
MCH: 32.8 pg (ref 26.0–34.0)
MCHC: 32.3 g/dL (ref 30.0–36.0)
MCV: 101.6 fL — ABNORMAL HIGH (ref 78.0–100.0)
Platelets: 51 10*3/uL — ABNORMAL LOW (ref 150–400)
RBC: 2.5 MIL/uL — ABNORMAL LOW (ref 3.87–5.11)
RDW: 22.7 % — ABNORMAL HIGH (ref 11.5–15.5)
WBC: 10.1 10*3/uL (ref 4.0–10.5)

## 2014-08-26 NOTE — Progress Notes (Addendum)
Notified Jonni Sanger with [facility] to arrange transportation to [facility] so that she can have her procedure.  [facility] was arranged for 08/27/2014 at 10:00am.

## 2014-08-26 NOTE — Progress Notes (Signed)
Patient ID: Wanda [name], female   DOB: 1975-09-05, 39 y.o.   MRN: [id number] Aware of request for CT guided bone marrow biopsy on pt. Will tent plan procedure for 1/18 at [facility] at 1100. Nurse aware of plans and will arrange for [facility] transport. Pt will return to [facility] following procedure. Orders placed.

## 2014-08-26 NOTE — Progress Notes (Signed)
Subjective: edema Interval History: Patient states that she is not still feeling good. She complains of having edema and leg swelling.  Objective: Vital signs in last 24 hours: Temp:  [97.9 F (36.6 C)-98.3 F (36.8 C)] 98 F (36.7 C) (01/17 0536) Pulse Rate:  [60-112] 81 (01/17 0536) Resp:  [20] 20 (01/17 0536) BP: (102-139)/(43-80) 131/75 mmHg (01/17 0536) SpO2:  [98 %-100 %] 100 % (01/17 0536) Weight:  [57.9 kg (127 lb 10.3 oz)-61 kg (134 lb 7.7 oz)] 61 kg (134 lb 7.7 oz) (01/17 0536) Weight change: 0 kg (0 lb)  Intake/Output from previous day: 01/16 0701 - 01/17 0700 In: 240 [P.O.:240] Out: 2501 [Stool:1] Intake/Output this shift:    Generally she is alert and in no apparent distress Chest decreased breath sounds bilaterally Heart exam reveals regular rate and rhythm no murmur Abdomen: Full but nontender Extremities she has 1+ edema on the right and trace edema on the left.  Lab Results:  Recent Labs  08/25/14 0545 08/26/14 0557  WBC 12.3* 10.1  HGB 8.2* 8.2*  HCT 24.6* 25.4*  PLT 77* 51*   BMET:   Recent Labs  08/25/14 0545 08/26/14 0557  NA 132* 135  K 4.9 4.7  CL 98 99  CO2 25 26  GLUCOSE 95 110*  BUN 39* 27*  CREATININE 2.70* 2.13*  CALCIUM 9.5 9.7   No results for input(s): PTH in the last 72 hours. Iron Studies:  No results for input(s): IRON, TIBC, TRANSFERRIN, FERRITIN in the last 72 hours.  Studies/Results: No results found.  I have reviewed the patient's current medications.  Assessment/Plan: Problem #1 acute on chronic renal failure: Patient presently is asymptomatic. Patient urine output seems to be declining. Presently she was dialyzed yesterday. Her potassium is normal. Problem #2 anasarca : Patient is still with ascites and edema. Presently has improved significantly. I discussed with her patient will continue to ascites because of her liver cirrhosis and it may require paracentesis if she becomes symptomatic. Patient however still  questions why he doesn't get away with dialysis. Problem #3 anemia: Her hemoglobin remains below target goal. Stable. Problem #4 liver cirrhosis Problem #5 bipolar disorder Problem #6 metabolic acidosis: Has corrected. Problem #7 metabolic or disease her calcium and phosphorus is range. Her binder has been discontinued. Problem #8 thrombocytopenia:  Plan: 1] we'll check her basic metabolic panel and CBC in the morning   LOS: 13 days   Minta Fair S 08/26/2014,9:17 AM

## 2014-08-26 NOTE — Progress Notes (Signed)
[facility] PROGRESS NOTE  Wanda [name]:[id number] DOB: 01-14-1976 DOA: 08/13/2014 PCP: [facility]  Assessment/Plan:  Acute renal failure superimposed on chronic kidney disease stage III. Associated anasarca that is gradually improving.  Able to remove fluid during dialysis.  Appreciate assistance nephrology. OP dialysis has been set up.   Metabolic acidosis secondary to acute renal failure, continue oral bicarbonate. Resolved status post dialysis.  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 Being evaluated at [facility] for transplant. May need to consider repeat paracentesis prior to discharge  Anemia of chronic disease with superimposed anemia of acute illness/renal failure. Stable. Bleeding around catheter site has stopped.  S/p total 2 units PRBC's since admission.   Thrombocytopenia. Acute on chronic. She is s/p transfusion of platelets. Platelet count lower today. Evaluated by heme who opine likely drug induced and recommended discontinuing demadex/aldactone. Started on high dose prednisone.I have discussed the possibility of bone marrow biopsy tomorrow. Not on any anticoagulants.   Chronic hypotension, much improve.  stable. Continue midodrine - dose changed to 10 mg TID (was 15 BID). Secondary to cirrhosis. Remains asymptomatic.  Possible SBP. Considered on admission, it does appear at this point the patient's pain is more chronic.Diagnostic paracentesis did not suggest SBP and culture no growth, final. Rocephin discontinued 08/20/14   Code Status: full Family Communication: none present Disposition Plan: home when ready   Consultants:  Nephrology  heme  Procedures:  Diagnostic paracentesis 1/7  1/8 transfusion one unit packed red blood cells  1/9 tunneled HD catheter placement  1/10 transfusion one unit packed red blood cells  1/13  transfusion platelets  1/12 dialysis  1/14 dialysis  1/16 dialysis  Antibiotics:  Rocephin 08/14/14- 08/20/14  HPI/Subjective: No new complaints. Feels the same as yesterday. She is tearful  Objective: Filed Vitals:   08/26/14 0536  BP: 131/75  Pulse: 81  Temp: 98 F (36.7 C)  Resp: 20    Intake/Output Summary (Last 24 hours) at 08/26/14 1137 Last data filed at 08/25/14 1900  Gross per 24 hour  Intake      0 ml  Output   2501 ml  Net  -2501 ml   Filed Weights   08/25/14 0537 08/25/14 1010 08/26/14 0536  Weight: 57.9 kg (127 lb 10.3 oz) 57.9 kg (127 lb 10.3 oz) 61 kg (134 lb 7.7 oz)    Exam:   General:  Chronically ill appearing appears comfortable  Cardiovascular: RRR 1+LE edema  Respiratory: Normal effort with clear breath sounds bilaterally to auscultation and hear no wheeze no crackles  Abdomen: Mild distention somewhat firm umbilical hernia, diffuse tenderness to palpation, mild  Musculoskeletal: Joints without swelling or erythema  Data Reviewed: Basic Metabolic Panel:  Recent Labs Lab 08/20/14 0702  08/22/14 0859 08/23/14 0616 08/24/14 0611 08/25/14 0545 08/26/14 0557  NA 135  < > 131* 133* 135 132* 135  K 3.9  < > 3.8 4.7 4.6 4.9 4.7  CL 101  < > 97 99 101 98 99  CO2 24  < > _0 GLUCOSE 108*  < > 130* 125* 96 95 110*  BUN 19  < > 22 33* 24* 39* 27*  CREATININE 2.65*  < > 2.40* 3.00* 2.09* 2.70* 2.13*  CALCIUM 8.4  < > 8.6 9.5 9.3 9.5 9.7  PHOS 3.6  --   --   --  3.3  --   --   < > =  values in this interval not displayed. Liver Function Tests: No results for input(s): AST, ALT, ALKPHOS, BILITOT, PROT, ALBUMIN in the last 168 hours. No results for input(s): LIPASE, AMYLASE in the last 168 hours. No results for input(s): AMMONIA in the last 168 hours. CBC:  Recent Labs Lab 08/22/14 0859 08/23/14 0616 08/24/14 0611 08/25/14 0545 08/26/14 0557  WBC 6.8 6.6 14.6* 12.3* 10.1  HGB 8.5* 8.6* 8.4* 8.2* 8.2*  HCT 25.8* 26.0*  25.8* 24.6* 25.4*  MCV 98.9 98.5 99.2 99.2 101.6*  PLT 29* 77* 62* 77* 51*   Cardiac Enzymes: No results for input(s): CKTOTAL, CKMB, CKMBINDEX, TROPONINI in the last 168 hours. BNP (last 3 results) No results for input(s): PROBNP in the last 8760 hours. CBG: No results for input(s): GLUCAP in the last 168 hours.  No results found for this or any previous visit (from the past 240 hour(s)).   Studies: No results found.  Scheduled Meds: . epoetin (EPOGEN/PROCRIT) injection  10,000 Units Intravenous Q M,W,F-HD  . ferric gluconate (FERRLECIT/NULECIT) IV  125 mg Intravenous Q T,Th,Sa-HD  . folic acid  1 mg Oral Daily  . midodrine  10 mg Oral TID WC  . ondansetron (ZOFRAN) IV  4 mg Intravenous Q4H  . pantoprazole  40 mg Oral Daily  . predniSONE  60 mg Oral Q breakfast  . sodium chloride  3 mL Intravenous Q12H   Continuous Infusions:   Principal Problem:   Acute renal failure Active Problems:   Anasarca   Anemia   Liver cirrhosis secondary to NASH   Renal failure   Metabolic acidosis   Hyponatremia   Thrombocytopenia   Absolute anemia    Time spent: 25 minutes    [facility] Pager [phone]. If 7PM-7AM, please contact night-coverage at [email or web address], password [facility] 08/26/2014, 11:37 AM  LOS: 13 days

## 2014-08-27 ENCOUNTER — Ambulatory Visit (HOSPITAL_COMMUNITY)
Admission: EM | Admit: 2014-08-27 | Discharge: 2014-08-27 | Disposition: A | Discharge status: Home / Self Care | Payer: Medicare Other | Source: Home / Self Care | Attending: Interventional Radiology | Admitting: Interventional Radiology

## 2014-08-27 ENCOUNTER — Other Ambulatory Visit (HOSPITAL_COMMUNITY): Payer: Self-pay | Admitting: Oncology

## 2014-08-27 ENCOUNTER — Ambulatory Visit (HOSPITAL_COMMUNITY)
Admission: EM | Admit: 2014-08-27 | Discharge: 2014-08-27 | Disposition: A | Discharge status: Home / Self Care | Payer: Medicare Other | Source: Home / Self Care | Attending: Internal Medicine | Admitting: Internal Medicine

## 2014-08-27 DIAGNOSIS — D696 Thrombocytopenia, unspecified: Secondary | ICD-10-CM

## 2014-08-27 LAB — CBC
HEMATOCRIT: 26.3 % — AB (ref 36.0–46.0)
Hemoglobin: 8.7 g/dL — ABNORMAL LOW (ref 12.0–15.0)
MCH: 33.2 pg (ref 26.0–34.0)
MCHC: 33.1 g/dL (ref 30.0–36.0)
MCV: 100.4 fL — ABNORMAL HIGH (ref 78.0–100.0)
Platelets: 77 10*3/uL — ABNORMAL LOW (ref 150–400)
RBC: 2.62 MIL/uL — ABNORMAL LOW (ref 3.87–5.11)
RDW: 22.7 % — ABNORMAL HIGH (ref 11.5–15.5)
WBC: 11.7 10*3/uL — ABNORMAL HIGH (ref 4.0–10.5)

## 2014-08-27 LAB — BONE MARROW EXAM

## 2014-08-27 LAB — BASIC METABOLIC PANEL
Anion gap: 10 (ref 5–15)
BUN: 44 mg/dL — ABNORMAL HIGH (ref 6–23)
CALCIUM: 9.8 mg/dL (ref 8.4–10.5)
CHLORIDE: 99 meq/L (ref 96–112)
CO2: 24 mmol/L (ref 19–32)
Creatinine, Ser: 2.51 mg/dL — ABNORMAL HIGH (ref 0.50–1.10)
GFR calc Af Amer: 26 mL/min — ABNORMAL LOW (ref >90)
GFR calc non Af Amer: 23 mL/min — ABNORMAL LOW (ref >90)
GLUCOSE: 88 mg/dL (ref 70–99)
Potassium: 4.2 mmol/L (ref 3.5–5.1)
SODIUM: 133 mmol/L — AB (ref 135–145)

## 2014-08-27 MED ORDER — METOLAZONE 5 MG PO TABS
5.0000 mg | ORAL_TABLET | Freq: Two times a day (BID) | ORAL | Status: DC
  Administered 2014-08-27 – 2014-08-28 (×2): 5 mg via ORAL
  Filled 2014-08-27 (×2): qty 1

## 2014-08-27 MED ORDER — ALPRAZOLAM 0.5 MG PO TABS
0.5000 mg | ORAL_TABLET | Freq: Once | ORAL | Status: AC
  Administered 2014-08-27: 0.5 mg via ORAL
  Filled 2014-08-27: qty 1

## 2014-08-27 MED ORDER — SODIUM CHLORIDE 0.9 % IV SOLN: 100.0000 mL | INTRAVENOUS | Status: DC | PRN

## 2014-08-27 MED ORDER — HEPARIN SODIUM (PORCINE) 1000 UNIT/ML IJ SOLN
INTRAMUSCULAR | Status: AC
  Filled 2014-08-27: qty 5

## 2014-08-27 MED ORDER — ALBUMIN HUMAN 25 % IV SOLN
50.0000 g | Freq: Once | INTRAVENOUS | Status: AC
  Administered 2014-08-27: 50 g via INTRAVENOUS
  Filled 2014-08-27: qty 200

## 2014-08-27 MED ORDER — ALTEPLASE 2 MG IJ SOLR
2.0000 mg | Freq: Once | INTRAMUSCULAR | Status: AC | PRN
  Filled 2014-08-27: qty 2

## 2014-08-27 MED ORDER — MIDAZOLAM HCL 2 MG/2ML IJ SOLN
INTRAMUSCULAR | Status: AC
  Filled 2014-08-27: qty 6

## 2014-08-27 MED ORDER — EPOETIN ALFA 10000 UNIT/ML IJ SOLN
INTRAMUSCULAR | Status: AC
  Administered 2014-08-27: 10000 [IU] via INTRAVENOUS
  Filled 2014-08-27: qty 1

## 2014-08-27 MED ORDER — FENTANYL CITRATE 0.05 MG/ML IJ SOLN
INTRAMUSCULAR | Status: AC | PRN
  Administered 2014-08-27 (×2): 25 ug via INTRAVENOUS
  Administered 2014-08-27: 50 ug via INTRAVENOUS

## 2014-08-27 MED ORDER — SODIUM CHLORIDE 0.9 % IJ SOLN
INTRAMUSCULAR | Status: AC
  Administered 2014-08-27: 10 mL via INTRAVENOUS
  Filled 2014-08-27: qty 12

## 2014-08-27 MED ORDER — MIDAZOLAM HCL 2 MG/2ML IJ SOLN
INTRAMUSCULAR | Status: AC | PRN
  Administered 2014-08-27 (×2): 0.5 mg via INTRAVENOUS
  Administered 2014-08-27: 1 mg via INTRAVENOUS

## 2014-08-27 MED ORDER — SODIUM CHLORIDE 0.9 % IV SOLN
125.0000 mg | INTRAVENOUS | Status: DC
  Administered 2014-08-27: 125 mg via INTRAVENOUS
  Filled 2014-08-27 (×2): qty 10

## 2014-08-27 MED ORDER — FENTANYL CITRATE 0.05 MG/ML IJ SOLN
INTRAMUSCULAR | Status: AC
  Filled 2014-08-27: qty 4

## 2014-08-27 NOTE — Progress Notes (Signed)
Subjective: edema Interval History: Patient claims she didn't see any change. Still she is not feeling good. And she state she doesn't understand what is going on.  Objective: Vital signs in last 24 hours: Temp:  [97.6 F (36.4 C)-98.5 F (36.9 C)] 98.5 F (36.9 C) (01/18 0544) Pulse Rate:  [58-75] 72 (01/18 0544) Resp:  [20] 20 (01/18 0544) BP: (121-153)/(63-90) 126/90 mmHg (01/18 0544) SpO2:  [99 %-100 %] 100 % (01/18 0544) Weight:  [58.514 kg (129 lb)] 58.514 kg (129 lb) (01/18 0700) Weight change: 0.614 kg (1 lb 5.7 oz)  Intake/Output from previous day: 01/17 0701 - 01/18 0700 In: 240 [P.O.:240] Out: -  Intake/Output this shift:    Generally she is alert and in no apparent distress Chest decreased breath sounds bilaterally Heart exam reveals regular rate and rhythm no murmur Abdomen: Full but nontender Extremities she has 1+ edema on the right and trace edema on the left.  Lab Results:  Recent Labs  08/25/14 0545 08/26/14 0557  WBC 12.3* 10.1  HGB 8.2* 8.2*  HCT 24.6* 25.4*  PLT 77* 51*   BMET:   Recent Labs  08/25/14 0545 08/26/14 0557  NA 132* 135  K 4.9 4.7  CL 98 99  CO2 25 26  GLUCOSE 95 110*  BUN 39* 27*  CREATININE 2.70* 2.13*  CALCIUM 9.5 9.7   No results for input(s): PTH in the last 72 hours. Iron Studies:  No results for input(s): IRON, TIBC, TRANSFERRIN, FERRITIN in the last 72 hours.  Studies/Results: No results found.  I have reviewed the patient's current medications.  Assessment/Plan: Problem #1 acute on chronic renal failure: She is status post hemodialysis on Saturday. Patient does not have any uremic signs and symptoms. Problem #2 anasarca : Patient is still with ascites and edema. Presently seems to be oliguric. She states that she is making some urine but no documented. Problem #3 anemia: Her hemoglobin remains below target goal. Stable. Problem #4 liver cirrhosis Problem #5 bipolar disorder Problem #6 metabolic acidosis:  Has corrected. Problem #7 metabolic or disease her calcium and phosphorus is range. Her binder has been discontinued. Problem #8 thrombocytopenia:  Plan: 1] we'll check her basic metabolic panel and CBC in the morning 2] we'll try to dialyze patient today and use some albumin and possibly try to get about 3 1/2 for blood pressure tolerates. 3] will increase her dialysis time to 4 hours. 4] blood metolazone 5 mg by mouth twice a day.  LOS: 14 days   Boston Catarino S 08/27/2014,7:35 AM

## 2014-08-27 NOTE — Procedures (Signed)
Procedure:  CT guided right iliac bone marrow biopsy Findings:  Aspirate and core biopsy obtained.  No complications.

## 2014-08-27 NOTE — Progress Notes (Signed)
Pt had a CT guided bone marrow biopsy and aspiration.  Pt received 63m versed and 1039m of fentanyl during the procedure.  Pt back to short stay to await [facility] transport at 1200. Pt is alert and oriented.  Dressing to lower rt back is dry and intact.  Dressing to remain in place for 24 hours and then you may remove it.

## 2014-08-27 NOTE — Clinical Social Work Note (Signed)
CSW met with pt, but pt not engaging in conversation. It was reported that pt did not have anywhere to go after d/c. CSW asked pt about this and she states that she is not sure where this came from as she has a home. CSW assured pt that it must have been a misunderstanding, but wanted to make sure she had housing. CSW updated Carla at [facility] on pt and will continue to follow to notify pt of dialysis time once d/c.  Benay Pike, [place]

## 2014-08-27 NOTE — Progress Notes (Signed)
[facility] arrived to transport pt to [facility].  Pt d/c with [facility] at this time.

## 2014-08-27 NOTE — Procedures (Signed)
   HEMODIALYSIS TREATMENT NOTE:  4 hour heparin-free dialysis completed via right IJ tunneled catheter.  Exit site with scant sanguinous drainage.  Goal met:  Tolerated removal of 3.5 liters without interruption in ultrafiltration.  BP stable with HD; Albumin 50g given at onset of treatment.  All blood was reinfused.  Report called to Jessica Hearn, RN.  Angela L. Poteat, RN, CDN 

## 2014-08-27 NOTE — Progress Notes (Signed)
[facility] PROGRESS NOTE  AKEIA PEROT MBT:[id number] DOB: 30-Jul-1976 DOA: 08/13/2014 PCP: [place]  Assessment/Plan:  Acute renal failure superimposed on chronic kidney disease stage III. Associated anasarca with LE edema that appears worsening on right and stable on left. Ascites gradually increasing. Dialysis today.  Appreciate assistance nephrology. OP dialysis has been set up.   Metabolic acidosis secondary to acute renal failure, continue oral bicarbonate. Resolved status post dialysis.  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 Being evaluated at [facility] for [facility]. Will consider repeat paracentesis prior to discharge  Anemia of chronic disease with superimposed anemia of acute illness/renal failure. Stable. Bleeding around catheter site has stopped. S/p total 2 units PRBC's since admission.   Thrombocytopenia. Acute on chronic. She is s/p transfusion of platelets. Platelet count range 55-77 inspite of prednisone. Evaluated by heme who opine likely drug induced and recommended discontinuing demadex/aldactone. Started on high dose prednisone. Scheduled for bone marrow biopsy today.  Not on any anticoagulants.   Chronic hypotension, much improve. Continue midodrine - dose changed to 10 mg TID (was 15 BID). Secondary to cirrhosis. Remains asymptomatic.  Possible SBP. Considered on admission, it does appear at this point the patient's pain is more chronic.Diagnostic paracentesis did not suggest SBP and culture no growth, final. Rocephin discontinued 08/20/14   Code Status: full Family Communication: none present Disposition Plan: home when ready hopefully tomorrow   Consultants:  Nephrology  heme    Procedures:  Diagnostic paracentesis 1/7  1/8 transfusion one unit packed red blood cells  1/9 tunneled HD catheter placement  1/10  transfusion one unit packed red blood cells  1/13 transfusion platelets  1/12 dialysis  1/14 dialysis  1/16 dialysis  1/18 bone marrow biopsy, dialysis  Antibiotics:  Rocephin 08/14/14- 08/20/14  HPI/Subjective: Sitting on side of bed. Denies pain/discomfort. Verbalizes some anxiety about biopsy today  Objective: Filed Vitals:   08/27/14 0900  BP: 146/90  Pulse: 102  Temp: 98 F (36.7 C)  Resp: 18    Intake/Output Summary (Last 24 hours) at 08/27/14 0914 Last data filed at 08/26/14 2000  Gross per 24 hour  Intake    240 ml  Output      0 ml  Net    240 ml   Filed Weights   08/25/14 1010 08/26/14 0536 08/27/14 0700  Weight: 57.9 kg (127 lb 10.3 oz) 61 kg (134 lb 7.7 oz) 58.514 kg (129 lb)    Exam:   General:  Appears comfortable, color greyish  Cardiovascular: RRR 1-2+ LE edema on right, trace on left  Respiratory: normal effort BS clear bilaterally but distant no wheeze no rhonchi  Abdomen: mild distention +fluid wave, +hernia. Mild diffuse tenderness to palpation  Musculoskeletal: no clubbing or cyanosis   Data Reviewed: Basic Metabolic Panel:  Recent Labs Lab 08/23/14 0616 08/24/14 0611 08/25/14 0545 08/26/14 0557 08/27/14 0811  NA 133* 135 132* 135 133*  K 4.7 4.6 4.9 4.7 4.2  CL 99 101 98 99 99  CO2 25 27 25 26 24   GLUCOSE 125* 96 95 110* 88  BUN 33* 24* 39* 27* 44*  CREATININE 3.00* 2.09* 2.70* 2.13* 2.51*  CALCIUM 9.5 9.3 9.5 9.7 9.8  PHOS  --  3.3  --   --   --    Liver Function Tests: No results for input(s): AST, ALT, ALKPHOS, BILITOT, PROT, ALBUMIN in the last 168 hours. No results for  input(s): LIPASE, AMYLASE in the last 168 hours. No results for input(s): AMMONIA in the last 168 hours. CBC:  Recent Labs Lab 08/23/14 0616 08/24/14 0611 08/25/14 0545 08/26/14 0557 08/27/14 0811  WBC 6.6 14.6* 12.3* 10.1 11.7*  HGB 8.6* 8.4* 8.2* 8.2* 8.7*  HCT 26.0* 25.8* 24.6* 25.4* 26.3*  MCV 98.5 99.2 99.2 101.6* 100.4*  PLT 77* 62*  77* 51* 77*   Cardiac Enzymes: No results for input(s): CKTOTAL, CKMB, CKMBINDEX, TROPONINI in the last 168 hours. BNP (last 3 results) No results for input(s): PROBNP in the last 8760 hours. CBG: No results for input(s): GLUCAP in the last 168 hours.  No results found for this or any previous visit (from the past 240 hour(s)).   Studies: No results found.  Scheduled Meds: . albumin human  50 g Intravenous Once  . epoetin (EPOGEN/PROCRIT) injection  10,000 Units Intravenous Q M,W,F-HD  . ferric gluconate (FERRLECIT/NULECIT) IV  125 mg Intravenous Q T,Th,Sa-HD  . folic acid  1 mg Oral Daily  . metolazone  5 mg Oral BID  . midodrine  10 mg Oral TID WC  . ondansetron (ZOFRAN) IV  4 mg Intravenous Q4H  . pantoprazole  40 mg Oral Daily  . predniSONE  60 mg Oral Q breakfast  . sodium chloride  3 mL Intravenous Q12H   Continuous Infusions:   Principal Problem:   Acute renal failure Active Problems:   Anasarca   Anemia   Liver cirrhosis secondary to NASH   Renal failure   Metabolic acidosis   Hyponatremia   Thrombocytopenia   Absolute anemia    Time spent: 35 minutes    [facility] Pager [phone]. If 7PM-7AM, please contact night-coverage at [email or web address], password [facility] 08/27/2014, 9:14 AM  LOS: 14 days

## 2014-08-27 NOTE — H&P (Signed)
Chief Complaint: Thrombocytopenia  Referring Physician(s): Memon,Jehanzeb  History of Present Illness: Wanda [name] is a 39 y.o. female with persistent thrombocytopenia despite medical changes, prednisone or platelet transfusion. IR received request for bone marrow biopsy and she is scheduled today for an image guided bone marrow biopsy with sedation. She denies any chest pain, shortness of breath or palpitations. She admits to continued bleeding around port site and excessive bruising. The patient denies any history of sleep apnea or chronic oxygen use. She has previously tolerated sedation without complications.    Past Medical History  Diagnosis Date  . GERD (gastroesophageal reflux disease)   . 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 c/w steatohepatitis  . Folate deficiency 09/2013  . Anasarca 10/10/2013  . Hematemesis/vomiting blood 02/24/2014  . Acute blood loss anemia 02/25/2014    Status post transfusion  . Macrocytosis 02/28/2014  . 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 Acute renal failure 09/2013    Pre-renal- resolved  . C. difficile colitis 04/19/2014  . Fatty liver disease, nonalcoholic 3382    Past Surgical History  Procedure Laterality Date  . None    . Paracentesis  Feb 2015    1180 fluid, negative fluid analysis.   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 Paracentesis  10/2013  . Colonoscopy N/A 12/19/2013    SLF:NO OBVIOUS SOURCE FOR ANEMIA IDETIFIED/ONE COLON POLYP REMOVED/Small internal hemorrhoids  . Esophagogastroduodenoscopy N/A 02/11/2014    Dr. Rourk:Esophageal varices with bleeding stigmata-status post esophageal band ligation therapy. Portal gastropathy  . Esophagogastroduodenoscopy N/A 07/04/2014    Procedure: ESOPHAGOGASTRODUODENOSCOPY (EGD);  Surgeon:  Daneil Dolin, MD;  Location: [facility];  Service: Endoscopy;  Laterality: N/A;  945  . Esophageal banding  07/04/2014    Procedure: ESOPHAGEAL BANDING;  Surgeon: Daneil Dolin, MD;  Location: [facility];  Service: Endoscopy;;  . Esophagogastroduodenoscopy (egd) with propofol N/A 07/24/2014    Procedure: ESOPHAGOGASTRODUODENOSCOPY (EGD) WITH PROPOFOL;  Surgeon: Danie Binder, MD;  Location: [facility];  Service: Endoscopy;  Laterality: N/A;  . Esophageal banding N/A 07/24/2014    Procedure: ESOPHAGEAL BANDING (2 bands applied);  Surgeon: Danie Binder, MD;  Location: [facility];  Service: Endoscopy;  Laterality: N/A;    Allergies: Lasix  Medications: Prior to Admission medications   Medication Sig Start Date End Date Taking? Authorizing Provider  lidocaine (XYLOCAINE) 2 % solution 2 TSP  PO QAC AND HS TO PREVENT FOR CHEST PAIN WHILE EATING Patient taking differently: Use as directed 10 mLs in the mouth or throat See admin instructions. 2 TSP  PO QAC AND HS TO PREVENT FOR CHEST PAIN WHILE EATING 07/26/14   Danie Binder, MD  loperamide (IMODIUM) 2 MG capsule Take 1 capsule (2 mg total) by mouth 2 (two) times daily as needed for diarrhea or loose stools. 05/18/14   Maryann Mikhail, DO  magnesium oxide (MAG-OX) 400 (241.3 MG) MG tablet Take 1 tablet (400 mg total) by mouth daily. Patient taking differently: Take 400 mg by mouth once a week.  04/27/14   Rexene Alberts, MD  midodrine (PROAMATINE) 10 MG tablet Take 1 tablet (10 mg total) by mouth 2 (two) times daily with a meal. Medication to keep your blood pressure from falling. 04/27/14   Rexene Alberts, MD  oxyCODONE 10 MG TABS Take 1  tablet (10 mg total) by mouth every 6 (six) hours as needed for moderate pain. 07/07/14   Donne Hazel, MD  pantoprazole (PROTONIX) 40 MG tablet Take 1 tablet (40 mg total) by mouth daily. 07/07/14   Donne Hazel, MD  potassium chloride (MICRO-K) 10 MEQ CR capsule Take 10 mEq by mouth 3 (three) times daily.  03/02/14   Historical Provider, MD  spironolactone (ALDACTONE) 50 MG tablet Start this 50 mg tablet 1 time daily after you finish your 25 mg tablets. Patient taking differently: Take 50 mg by mouth daily.  04/27/14   Rexene Alberts, MD  torsemide (DEMADEX) 10 MG tablet Take 3 tablets (30 mg total) by mouth daily. 04/27/14   Rexene Alberts, MD    Family History  Problem Relation Age of Onset  . Heart disease Mother   . Colon cancer Neg Hx   . Liver disease Neg Hx     History   Social History  . Marital Status: Single    Spouse Name: N/A    Number of Children: 1  . Years of Education: N/A   Occupational History  . unemployed    Social History Main Topics  . Smoking status: Former Smoker -- 0.25 packs/day for 20 years    Types: Cigarettes    Quit date: 11/05/2008  . Smokeless tobacco: Never Used  . Alcohol Use: No  . Drug Use: No  . Sexual Activity:    Partners: Male    Birth Control/ Protection: None   Other Topics Concern  . Not on file   Social History Narrative   Review of Systems: A 12 point ROS discussed and pertinent positives are indicated in the HPI above.  All other systems are negative.  Review of Systems  Vital Signs: T: 90F, HR: 102 bpm, BP: 146/90 mmHg  Physical Exam  Constitutional: She is oriented to person, place, and time. No distress.  HENT:  Head: Normocephalic and atraumatic.  Neck: No tracheal deviation present.  Cardiovascular: Regular rhythm.  Exam reveals no gallop and no friction rub.   No murmur heard. Tachycardic  Pulmonary/Chest: Effort normal and breath sounds normal. No respiratory distress. She has no wheezes. She has no rales.  Neurological: She is alert and oriented to person, place, and time.  Skin: Skin is warm and dry. She is not diaphoretic.    Imaging: US Renal  08/14/2014   CLINICAL DATA:  39 year old with hepatic cirrhosis and portal hypertension, presenting with acute renal failure.  EXAM: RENAL/URINARY TRACT ULTRASOUND  COMPLETE  COMPARISON:  No prior urinary tract ultrasound. CT abdomen and pelvis 09/11/2013.  FINDINGS: Right Kidney:  Length: Approximately 11.0 cm. Echogenic parenchyma. Well-preserved cortex. No focal parenchymal abnormality. No hydronephrosis.  Left Kidney:  Length: Approximately 10.1 cm. Echogenic parenchyma. Well-preserved cortex. No focal parenchymal abnormality. No hydronephrosis.  Bladder:  Likely decompressed, as it was not clearly visualized in the pelvis.  Other findings:  Large amount of pelvic ascites.  IMPRESSION: 1. No evidence of hydronephrosis involving either kidney. 2. Echogenic renal parenchyma bilaterally consistent with medical renal disease. 3. Large amount of pelvic ascites.   Electronically Signed   By: Evangeline Dakin M.D.   On: 08/14/2014 15:46   US Paracentesis  08/16/2014   CLINICAL DATA:  Cirrhosis due to NASH, ascites  EXAM: ULTRASOUND GUIDED DIAGNOSTIC AND THERAPEUTIC PARACENTESIS  COMPARISON:  08/14/2014 renal ultrasound  PROCEDURE: Procedure, benefits, and risks of procedure were discussed with patient.  Written informed consent for procedure was obtained.  Time out protocol followed.  Adequate collection of ascites localized by ultrasound in RIGHT lower quadrant.  Skin prepped and draped in usual sterile fashion.  Skin and soft tissues anesthetized with 10 mL of 1% lidocaine.  5 [place] Yueh catheter placed into peritoneal cavity.  1000 mL of yellow fluid aspirated by vacuum bottle suction.  Volume of ascites removed was limited to 1 L at request of referring physician due to low blood pressure.  Procedure tolerated well by patient without immediate complication.  FINDINGS: A total of approximately 1000 mL of ascitic fluid was removed. A fluid sample of 180 mL was sent for laboratory analysis.  IMPRESSION: Successful ultrasound guided paracentesis yielding 1000 mL of ascites.   Electronically Signed   By: Lavonia Dana M.D.   On: 08/16/2014 12:03   Ir Fluoro Guide Cv Line  Right  08/18/2014   INDICATION: End-stage renal disease. In need of intravenous access for the initiation of dialysis.  EXAM: TUNNELED CENTRAL VENOUS HEMODIALYSIS CATHETER PLACEMENT WITH ULTRASOUND AND FLUOROSCOPIC GUIDANCE  MEDICATIONS: Ancef 2 gm IV; The IV antibiotic was given in an appropriate time interval prior to skin puncture.  CONTRAST:  None  ANESTHESIA/SEDATION: Versed 1 mg IV; Fentanyl 50 mcg IV  Total Moderate Sedation Time  15 minutes.  FLUOROSCOPY TIME:  24 seconds (4.1 mGy)  COMPLICATIONS: None immediate  PROCEDURE: Informed written consent was obtained from the patient after a discussion of the risks, benefits, and alternatives to treatment. Questions regarding the procedure were encouraged and answered. The right neck and chest were prepped with chlorhexidine in a sterile fashion, and a sterile drape was applied covering the operative field. Maximum barrier sterile technique with sterile gowns and gloves were used for the procedure. A timeout was performed prior to the initiation of the procedure.  After creating a small venotomy incision, a micropuncture kit was utilized to access the right internal jugular vein under direct, real-time ultrasound guidance after the overlying soft tissues were anesthetized with 1% lidocaine with epinephrine. Ultrasound image documentation was performed. The microwire was kinked to measure appropriate catheter length. A stiff Glidewire was advanced to the level of the IVC and the micropuncture sheath was exchanged for a peel-away sheath. A hemosplit tunneled hemodialysis catheter measuring 19 cm from tip to cuff was tunneled in a retrograde fashion from the anterior chest wall to the venotomy incision.  The catheter was then placed through the peel-away sheath with tips ultimately positioned within the superior aspect of the right atrium. Final catheter positioning was confirmed and documented with a spot radiographic image. The catheter aspirates and flushes  normally. The catheter was flushed with appropriate volume heparin dwells.  The catheter exit site was secured with a 0-Prolene retention suture. The venotomy incision was closed with an interrupted 4-0 Vicryl, Dermabond and Steri-strips. Dressings were applied. The patient tolerated the procedure well without immediate post procedural complication.  IMPRESSION: Successful placement of 19 cm tip to cuff tunneled hemodialysis catheter via the right internal jugular vein with tips terminating within the superior aspect of the right atrium. The catheter is ready for immediate use.   Electronically Signed   By: Sandi Mariscal M.D.   On: 08/18/2014 11:36   Ir US Guide Vasc Access Right  08/18/2014   INDICATION: End-stage renal disease. In need of intravenous access for the initiation of dialysis.  EXAM: TUNNELED CENTRAL VENOUS HEMODIALYSIS CATHETER PLACEMENT WITH ULTRASOUND AND FLUOROSCOPIC GUIDANCE  MEDICATIONS: Ancef 2 gm IV; The IV antibiotic was given in an  appropriate time interval prior to skin puncture.  CONTRAST:  None  ANESTHESIA/SEDATION: Versed 1 mg IV; Fentanyl 50 mcg IV  Total Moderate Sedation Time  15 minutes.  FLUOROSCOPY TIME:  24 seconds (4.1 mGy)  COMPLICATIONS: None immediate  PROCEDURE: Informed written consent was obtained from the patient after a discussion of the risks, benefits, and alternatives to treatment. Questions regarding the procedure were encouraged and answered. The right neck and chest were prepped with chlorhexidine in a sterile fashion, and a sterile drape was applied covering the operative field. Maximum barrier sterile technique with sterile gowns and gloves were used for the procedure. A timeout was performed prior to the initiation of the procedure.  After creating a small venotomy incision, a micropuncture kit was utilized to access the right internal jugular vein under direct, real-time ultrasound guidance after the overlying soft tissues were anesthetized with 1% lidocaine with  epinephrine. Ultrasound image documentation was performed. The microwire was kinked to measure appropriate catheter length. A stiff Glidewire was advanced to the level of the IVC and the micropuncture sheath was exchanged for a peel-away sheath. A hemosplit tunneled hemodialysis catheter measuring 19 cm from tip to cuff was tunneled in a retrograde fashion from the anterior chest wall to the venotomy incision.  The catheter was then placed through the peel-away sheath with tips ultimately positioned within the superior aspect of the right atrium. Final catheter positioning was confirmed and documented with a spot radiographic image. The catheter aspirates and flushes normally. The catheter was flushed with appropriate volume heparin dwells.  The catheter exit site was secured with a 0-Prolene retention suture. The venotomy incision was closed with an interrupted 4-0 Vicryl, Dermabond and Steri-strips. Dressings were applied. The patient tolerated the procedure well without immediate post procedural complication.  IMPRESSION: Successful placement of 19 cm tip to cuff tunneled hemodialysis catheter via the right internal jugular vein with tips terminating within the superior aspect of the right atrium. The catheter is ready for immediate use.   Electronically Signed   By: Sandi Mariscal M.D.   On: 08/18/2014 11:36    Labs:  CBC:  Recent Labs  08/24/14 0611 08/25/14 0545 08/26/14 0557 08/27/14 0811  WBC 14.6* 12.3* 10.1 11.7*  HGB 8.4* 8.2* 8.2* 8.7*  HCT 25.8* 24.6* 25.4* 26.3*  PLT 62* 77* 51* 77*    COAGS:  Recent Labs  11/23/13 0932  05/14/14 1430  07/03/14 1542 07/04/14 0542 07/17/14 1520 08/18/14 0549  INR 1.19  < > 1.31  < > 1.34 1.32 1.22 1.29  APTT 34  --  37  --  36  --   --  36  < > = values in this interval not displayed.  BMP:  Recent Labs  08/24/14 0611 08/25/14 0545 08/26/14 0557 08/27/14 0811  NA 135 132* 135 133*  K 4.6 4.9 4.7 4.2  CL 101 98 99 99  CO2 _0 GLUCOSE 96 95 110* 88  BUN 24* 39* 27* 44*  CALCIUM 9.3 9.5 9.7 9.8  CREATININE 2.09* 2.70* 2.13* 2.51*  GFRNONAA 29* 21* 28* 23*  GFRAA 33* 24* 32* 26*    LIVER FUNCTION TESTS:  Recent Labs  07/05/14 0548 07/06/14 0624 08/13/14 2121 08/14/14 0926 08/18/14 1226  BILITOT 2.1* 1.9* 1.9* 1.6*  --   AST 74* 56* 59* 50*  --   ALT _1 ALKPHOS 86 86 95 85  --   PROT 7.2 7.0 7.5 6.6  --  ALBUMIN 2.3* 2.4* 2.8* 2.5*  --     TUMOR MARKERS:  Recent Labs  06/13/14 0959  AFPTM 4.2    Assessment and Plan: Thrombocytopenia despite medication changes, prednisone and platelet transfusion.  Cirrhosis secondary to NASH  History of esophogeal varices s/p banding Acute on CKD on HD Scheduled today for image guided bone marrow biopsy with moderate sedation Patient has been NPO, labs reviewed Risks and Benefits discussed with the patient. All of the patient's questions were answered, patient is agreeable to proceed. Consent signed and in chart.    SignedHedy Jacob 08/27/2014, 10:25 AM

## 2014-08-28 ENCOUNTER — Inpatient Hospital Stay (HOSPITAL_COMMUNITY): Payer: Medicare Other

## 2014-08-28 LAB — PHOSPHORUS: Phosphorus: 3.6 mg/dL (ref 2.3–4.6)

## 2014-08-28 LAB — CBC
HEMATOCRIT: 26 % — AB (ref 36.0–46.0)
Hemoglobin: 8.5 g/dL — ABNORMAL LOW (ref 12.0–15.0)
MCH: 33.3 pg (ref 26.0–34.0)
MCHC: 32.7 g/dL (ref 30.0–36.0)
MCV: 102 fL — ABNORMAL HIGH (ref 78.0–100.0)
PLATELETS: 54 10*3/uL — AB (ref 150–400)
RBC: 2.55 MIL/uL — ABNORMAL LOW (ref 3.87–5.11)
RDW: 23.4 % — AB (ref 11.5–15.5)
WBC: 9.4 10*3/uL (ref 4.0–10.5)

## 2014-08-28 LAB — HEPATITIS B SURFACE ANTIBODY,QUALITATIVE

## 2014-08-28 LAB — BASIC METABOLIC PANEL
Anion gap: 10 (ref 5–15)
BUN: 28 mg/dL — ABNORMAL HIGH (ref 6–23)
CO2: 26 mmol/L (ref 19–32)
Calcium: 9.6 mg/dL (ref 8.4–10.5)
Chloride: 100 mEq/L (ref 96–112)
Creatinine, Ser: 2.05 mg/dL — ABNORMAL HIGH (ref 0.50–1.10)
GFR calc Af Amer: 34 mL/min — ABNORMAL LOW (ref >90)
GFR calc non Af Amer: 29 mL/min — ABNORMAL LOW (ref >90)
Glucose, Bld: 126 mg/dL — ABNORMAL HIGH (ref 70–99)
Potassium: 4.4 mmol/L (ref 3.5–5.1)
Sodium: 136 mmol/L (ref 135–145)

## 2014-08-28 MED ORDER — NEPRO/CARBSTEADY PO LIQD: 237.0000 mL | ORAL | Status: DC | PRN

## 2014-08-28 MED ORDER — FOLIC ACID 1 MG PO TABS: 1.0000 mg | ORAL_TABLET | Freq: Every day | ORAL | Status: DC

## 2014-08-28 MED ORDER — LOPERAMIDE HCL 2 MG PO CAPS: 2.0000 mg | ORAL_CAPSULE | Freq: Two times a day (BID) | ORAL | Status: DC | PRN

## 2014-08-28 MED ORDER — METOLAZONE 5 MG PO TABS: 5.0000 mg | ORAL_TABLET | Freq: Two times a day (BID) | ORAL | Status: DC

## 2014-08-28 MED ORDER — OXYCODONE HCL 10 MG PO TABS: 10.0000 mg | ORAL_TABLET | Freq: Four times a day (QID) | ORAL | Status: DC | PRN

## 2014-08-28 MED ORDER — PREDNISONE 20 MG PO TABS: 60.0000 mg | ORAL_TABLET | Freq: Every day | ORAL | Status: DC

## 2014-08-28 MED ORDER — LORAZEPAM 1 MG PO TABS: 1.0000 mg | ORAL_TABLET | Freq: Two times a day (BID) | ORAL | Status: DC | PRN

## 2014-08-28 NOTE — Progress Notes (Signed)
Paracentesis complete no signs of distress. 2400 ml amber colored ascites removed.

## 2014-08-28 NOTE — Discharge Summary (Signed)
Physician Discharge Summary  Wanda [name] QQI:[id number] DOB: 1975/12/04 DOA: 08/13/2014  PCP: [facility] date: 08/13/2014 Discharge date: 08/28/2014  Time spent: 40 minutes  Recommendations for Outpatient Follow-up:  1. Follow up with [place] as scheduled 2. Follow up with GI as scheduled 3. Follow up with PCP 1-2 weeks for evaluation of BP 4. Dialysis as arranged.   Discharge Diagnoses:  Principal Problem:   Acute renal failure Active Problems:   Anasarca   Anemia   Liver cirrhosis secondary to NASH   Renal failure   Metabolic acidosis   Hyponatremia   Thrombocytopenia   Absolute anemia   Discharge Condition: stable  Diet recommendation: Renal with 1244m fluid restriction  Filed Weights   08/26/14 0536 08/27/14 0700 08/28/14 0512  Weight: 61 kg (134 lb 7.7 oz) 58.514 kg (129 lb) 57.924 kg (127 lb 11.2 oz)    History of present illness:  HPI: 39yo female with hx of NKarlene Lineman  [facility] including lab work was told to go to ER due to acute renal failure with creatinine of 5. Pt presented to ED. Pt noted that slightly decreased urine output. Denied any recent medication changes. Pt denied nsaid use. Pt was noted to have anemia and ARF.    Hospital Course:   Acute renal failure superimposed on chronic kidney disease stage III. Associated anasarca with LE edema and ascites. Evaluated by renal dialysis initiated.  OP dialysis has been set up for Tuesday Thursday sat schedule.    Metabolic acidosis secondary to acute renal failure. Oral bicarbonate provided as well as dialysis.  Resolved at discharge.   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 [facility]2 during hospitalization. No evidence SBP.   Anemia of chronic disease with  superimposed anemia of acute illness/renal failure. Stable. Bleeding around catheter site has stopped. S/p total 2 units PRBC's since admission. Hg 8.5 at discharge  Thrombocytopenia. Acute on chronic. She is s/p transfusion of platelets. Platelet count range 55-77 inspite of prednisone. Evaluated by heme who opine likely drug induced and recommended discontinuing demadex/aldactone. Started on high dose prednisone. Bone marrow biopsy 08/27/14. [facility]   Chronic hypotension. At discharge much improved. Continue midodrine -secondary to cirrhosis. Remained asymptomatic.  Possible SBP. Considered on admission. Diagnostic paracentesis did not suggest SBP and culture no growth, final. Rocephin discontinued 08/20/14   Procedures:  Diagnostic paracentesis 1/7  1/8 transfusion one unit packed red blood cells  1/9 tunneled HD catheter placement  1/10 transfusion one unit packed red blood cells  1/13 transfusion platelets  1/12 dialysis  1/14 dialysis  1/16 dialysis  1/18 bone marrow biopsy, dialysis  1/19 dialysis  Consultations:  Nephrology  heme  Discharge Exam: Filed Vitals:   08/28/14 1053  BP: 146/82  Pulse: 63  Temp:   Resp:     General: well nourished chronically ill appearing Cardiovascular: RRR 1+ LE edema on right Respiratory: normal effort BS clear bilaterally  Discharge Instructions    Current Discharge Medication List    START taking these medications   Details  folic acid (FOLVITE) 1 MG tablet Take 1 tablet (1 mg total) by mouth daily. Qty: 30 tablet, Refills: 0    LORazepam (ATIVAN) 1 MG tablet Take 1 tablet (1 mg total) by mouth  every 12 (twelve) hours as needed for anxiety. Qty: 15 tablet, Refills: 0    metolazone (ZAROXOLYN) 5 MG tablet Take 1 tablet (5 mg total) by mouth 2 (two) times daily. Qty: 30 tablet, Refills: 0    Nutritional Supplements (FEEDING SUPPLEMENT, NEPRO CARB STEADY,) LIQD Take 237  mLs by mouth as needed (missed meal during dialysis.). Refills: 0    predniSONE (DELTASONE) 20 MG tablet Take 3 tablets (60 mg total) by mouth daily with breakfast. Qty: 30 tablet, Refills: 0      CONTINUE these medications which have CHANGED   Details  loperamide (IMODIUM) 2 MG capsule Take 1 capsule (2 mg total) by mouth 2 (two) times daily as needed for diarrhea or loose stools. Qty: 30 capsule, Refills: 0      CONTINUE these medications which have NOT CHANGED   Details  lidocaine (XYLOCAINE) 2 % solution 2 TSP  PO QAC AND HS TO PREVENT FOR CHEST PAIN WHILE EATING Qty: 500 mL, Refills: 1    magnesium oxide (MAG-OX) 400 (241.3 MG) MG tablet Take 1 tablet (400 mg total) by mouth daily. Qty: 30 tablet, Refills: 3    midodrine (PROAMATINE) 10 MG tablet Take 1 tablet (10 mg total) by mouth 2 (two) times daily with a meal. Medication to keep your blood pressure from falling. Qty: 60 tablet, Refills: 3    oxyCODONE 10 MG TABS Take 1 tablet (10 mg total) by mouth every 6 (six) hours as needed for moderate pain. Qty: 30 tablet, Refills: 0    pantoprazole (PROTONIX) 40 MG tablet Take 1 tablet (40 mg total) by mouth daily. Qty: 30 tablet, Refills: 0    potassium chloride (MICRO-K) 10 MEQ CR capsule Take 10 mEq by mouth 3 (three) times daily.      STOP taking these medications     spironolactone (ALDACTONE) 50 MG tablet      torsemide (DEMADEX) 10 MG tablet        Allergies  Allergen Reactions  . Lasix [Furosemide]     "doesn't work"   Follow-up Information    Follow up with [facility]. Schedule an appointment as soon as possible for a visit in 1 week.   Why:  evaluation of BP   Contact information:   [place] [address] [phone]        The results of significant diagnostics from this hospitalization (including imaging, microbiology, ancillary and laboratory) are listed below for reference.    Significant Diagnostic Studies: US  Renal  08/14/2014   CLINICAL DATA:  39 year old with hepatic cirrhosis and portal hypertension, presenting with acute renal failure.  EXAM: RENAL/URINARY TRACT ULTRASOUND COMPLETE  COMPARISON:  No prior urinary tract ultrasound. CT abdomen and pelvis 09/11/2013.  FINDINGS: Right Kidney:  Length: Approximately 11.0 cm. Echogenic parenchyma. Well-preserved cortex. No focal parenchymal abnormality. No hydronephrosis.  Left Kidney:  Length: Approximately 10.1 cm. Echogenic parenchyma. Well-preserved cortex. No focal parenchymal abnormality. No hydronephrosis.  Bladder:  Likely decompressed, as it was not clearly visualized in the pelvis.  Other findings:  Large amount of pelvic ascites.  IMPRESSION: 1. No evidence of hydronephrosis involving either kidney. 2. Echogenic renal parenchyma bilaterally consistent with medical renal disease. 3. Large amount of pelvic ascites.   Electronically Signed   By: Evangeline Dakin M.D.   On: 08/14/2014 15:46   US Paracentesis  08/28/2014   CLINICAL DATA:  Cirrhosis secondary to NASH, recurrent ascites  EXAM: ULTRASOUND GUIDED THERAPEUTIC PARACENTESIS  COMPARISON:  08/16/2014  PROCEDURE: Procedure, benefits, and risks of procedure were discussed with patient.  Written informed consent for procedure was obtained.  Time out protocol followed.  Adequate collection of ascites localized by ultrasound in RIGHT lower quadrant.  Skin prepped and draped in usual sterile fashion.  Skin and soft tissues anesthetized with 10 mL of 1% lidocaine.  5 [place] Yueh catheter placed into peritoneal cavity.  2400 mL of yellow fluid aspirated by vacuum bottle suction.  Procedure tolerated well by patient without immediate complication.  FINDINGS: As above  IMPRESSION: Successful ultrasound guided paracentesis yielding 2400 mL of ascites.   Electronically Signed   By: Lavonia Dana M.D.   On: 08/28/2014 12:02   US Paracentesis  08/16/2014   CLINICAL DATA:  Cirrhosis due to NASH, ascites  EXAM: ULTRASOUND  GUIDED DIAGNOSTIC AND THERAPEUTIC PARACENTESIS  COMPARISON:  08/14/2014 renal ultrasound  PROCEDURE: Procedure, benefits, and risks of procedure were discussed with patient.  Written informed consent for procedure was obtained.  Time out protocol followed.  Adequate collection of ascites localized by ultrasound in RIGHT lower quadrant.  Skin prepped and draped in usual sterile fashion.  Skin and soft tissues anesthetized with 10 mL of 1% lidocaine.  5 [place] Yueh catheter placed into peritoneal cavity.  1000 mL of yellow fluid aspirated by vacuum bottle suction.  Volume of ascites removed was limited to 1 L at request of referring physician due to low blood pressure.  Procedure tolerated well by patient without immediate complication.  FINDINGS: A total of approximately 1000 mL of ascitic fluid was removed. A fluid sample of 180 mL was sent for laboratory analysis.  IMPRESSION: Successful ultrasound guided paracentesis yielding 1000 mL of ascites.   Electronically Signed   By: Lavonia Dana M.D.   On: 08/16/2014 12:03   Ir Fluoro Guide Cv Line Right  08/18/2014   INDICATION: End-stage renal disease. In need of intravenous access for the initiation of dialysis.  EXAM: TUNNELED CENTRAL VENOUS HEMODIALYSIS CATHETER PLACEMENT WITH ULTRASOUND AND FLUOROSCOPIC GUIDANCE  MEDICATIONS: Ancef 2 gm IV; The IV antibiotic was given in an appropriate time interval prior to skin puncture.  CONTRAST:  None  ANESTHESIA/SEDATION: Versed 1 mg IV; Fentanyl 50 mcg IV  Total Moderate Sedation Time  15 minutes.  FLUOROSCOPY TIME:  24 seconds (4.1 mGy)  COMPLICATIONS: None immediate  PROCEDURE: Informed written consent was obtained from the patient after a discussion of the risks, benefits, and alternatives to treatment. Questions regarding the procedure were encouraged and answered. The right neck and chest were prepped with chlorhexidine in a sterile fashion, and a sterile drape was applied covering the operative field. Maximum barrier  sterile technique with sterile gowns and gloves were used for the procedure. A timeout was performed prior to the initiation of the procedure.  After creating a small venotomy incision, a micropuncture kit was utilized to access the right internal jugular vein under direct, real-time ultrasound guidance after the overlying soft tissues were anesthetized with 1% lidocaine with epinephrine. Ultrasound image documentation was performed. The microwire was kinked to measure appropriate catheter length. A stiff Glidewire was advanced to the level of the IVC and the micropuncture sheath was exchanged for a peel-away sheath. A hemosplit tunneled hemodialysis catheter measuring 19 cm from tip to cuff was tunneled in a retrograde fashion from the anterior chest wall to the venotomy incision.  The catheter was then placed through the peel-away sheath with tips ultimately positioned within the superior aspect of the right atrium. Final  catheter positioning was confirmed and documented with a spot radiographic image. The catheter aspirates and flushes normally. The catheter was flushed with appropriate volume heparin dwells.  The catheter exit site was secured with a 0-Prolene retention suture. The venotomy incision was closed with an interrupted 4-0 Vicryl, Dermabond and Steri-strips. Dressings were applied. The patient tolerated the procedure well without immediate post procedural complication.  IMPRESSION: Successful placement of 19 cm tip to cuff tunneled hemodialysis catheter via the right internal jugular vein with tips terminating within the superior aspect of the right atrium. The catheter is ready for immediate use.   Electronically Signed   By: Sandi Mariscal M.D.   On: 08/18/2014 11:36   Ir US Guide Vasc Access Right  08/18/2014   INDICATION: End-stage renal disease. In need of intravenous access for the initiation of dialysis.  EXAM: TUNNELED CENTRAL VENOUS HEMODIALYSIS CATHETER PLACEMENT WITH ULTRASOUND AND  FLUOROSCOPIC GUIDANCE  MEDICATIONS: Ancef 2 gm IV; The IV antibiotic was given in an appropriate time interval prior to skin puncture.  CONTRAST:  None  ANESTHESIA/SEDATION: Versed 1 mg IV; Fentanyl 50 mcg IV  Total Moderate Sedation Time  15 minutes.  FLUOROSCOPY TIME:  24 seconds (4.1 mGy)  COMPLICATIONS: None immediate  PROCEDURE: Informed written consent was obtained from the patient after a discussion of the risks, benefits, and alternatives to treatment. Questions regarding the procedure were encouraged and answered. The right neck and chest were prepped with chlorhexidine in a sterile fashion, and a sterile drape was applied covering the operative field. Maximum barrier sterile technique with sterile gowns and gloves were used for the procedure. A timeout was performed prior to the initiation of the procedure.  After creating a small venotomy incision, a micropuncture kit was utilized to access the right internal jugular vein under direct, real-time ultrasound guidance after the overlying soft tissues were anesthetized with 1% lidocaine with epinephrine. Ultrasound image documentation was performed. The microwire was kinked to measure appropriate catheter length. A stiff Glidewire was advanced to the level of the IVC and the micropuncture sheath was exchanged for a peel-away sheath. A hemosplit tunneled hemodialysis catheter measuring 19 cm from tip to cuff was tunneled in a retrograde fashion from the anterior chest wall to the venotomy incision.  The catheter was then placed through the peel-away sheath with tips ultimately positioned within the superior aspect of the right atrium. Final catheter positioning was confirmed and documented with a spot radiographic image. The catheter aspirates and flushes normally. The catheter was flushed with appropriate volume heparin dwells.  The catheter exit site was secured with a 0-Prolene retention suture. The venotomy incision was closed with an interrupted 4-0  Vicryl, Dermabond and Steri-strips. Dressings were applied. The patient tolerated the procedure well without immediate post procedural complication.  IMPRESSION: Successful placement of 19 cm tip to cuff tunneled hemodialysis catheter via the right internal jugular vein with tips terminating within the superior aspect of the right atrium. The catheter is ready for immediate use.   Electronically Signed   By: Sandi Mariscal M.D.   On: 08/18/2014 11:36   Ct Biopsy  08/27/2014   CLINICAL DATA:  Unexplained thrombocytopenia. The patient requires a bone marrow biopsy.  EXAM: CT GUIDED ASPIRATE AND CORE RIGHT ILIAC BONE MARROW BIOPSY  ANESTHESIA/SEDATION: Versed 2.0 mg IV, Fentanyl 100 mcg IV  Total Moderate Sedation Time  7 minutes.  PROCEDURE: The procedure risks, benefits, and alternatives were explained to the patient. Questions regarding the procedure were encouraged and answered.  The patient understands and consents to the procedure.  The right gluteal region was prepped with Betadine. Sterile gown and sterile gloves were used for the procedure. Local anesthesia was provided with 1% Lidocaine. A time-out was performed prior to the procedure.  Under CT guidance, an 11 gauge OnControl bone cutting needle was advanced from a posterior approach into the right iliac bone. Needle positioning was confirmed with CT. Initial non heparinized and heparinized aspirate samples were obtained of bone marrow. Core biopsy was performed with the outer biopsy needle.  COMPLICATIONS: None  FINDINGS: Inspection of initial aspirate did reveal visible particles. Intact core biopsy sample was obtained.  IMPRESSION: CT guided bone marrow biopsy of right posterior iliac bone with both aspirate and core samples obtained.   Electronically Signed   By: Aletta Edouard M.D.   On: 08/27/2014 13:27    Microbiology: No results found for this or any previous visit (from the past 240 hour(s)).   Labs: Basic Metabolic Panel:  Recent Labs Lab  08/24/14 0611 08/25/14 0545 08/26/14 0557 08/27/14 0811 08/28/14 0616  NA 135 132* 135 133* 136  K 4.6 4.9 4.7 4.2 4.4  CL 101 98 99 99 100  CO2 _0 GLUCOSE 96 95 110* 88 126*  BUN 24* 39* 27* 44* 28*  CREATININE 2.09* 2.70* 2.13* 2.51* 2.05*  CALCIUM 9.3 9.5 9.7 9.8 9.6  PHOS 3.3  --   --   --  3.6   Liver Function Tests: No results for input(s): AST, ALT, ALKPHOS, BILITOT, PROT, ALBUMIN in the last 168 hours. No results for input(s): LIPASE, AMYLASE in the last 168 hours. No results for input(s): AMMONIA in the last 168 hours. CBC:  Recent Labs Lab 08/24/14 0611 08/25/14 0545 08/26/14 0557 08/27/14 0811 08/28/14 0616  WBC 14.6* 12.3* 10.1 11.7* 9.4  HGB 8.4* 8.2* 8.2* 8.7* 8.5*  HCT 25.8* 24.6* 25.4* 26.3* 26.0*  MCV 99.2 99.2 101.6* 100.4* 102.0*  PLT 62* 77* 51* 77* 54*   Cardiac Enzymes: No results for input(s): CKTOTAL, CKMB, CKMBINDEX, TROPONINI in the last 168 hours. BNP: BNP (last 3 results) No results for input(s): PROBNP in the last 8760 hours. CBG: No results for input(s): GLUCAP in the last 168 hours.     SignedRadene Gunning  [facility] 08/28/2014, 1:32 PM

## 2014-08-28 NOTE — Progress Notes (Signed)
Wanda [name]  MRN: [id number]  DOB/AGE: May 29, 1976 39 y.o.  Primary [place] date: 08/13/2014  Chief Complaint:  Chief Complaint  Patient presents with  . sent by physician     S-Pt presented on  08/13/2014 with  Chief Complaint  Patient presents with  . sent by physician   .     Pt offers no new complaints.  Meds . epoetin (EPOGEN/PROCRIT) injection  10,000 Units Intravenous Q M,W,F-HD  . ferric gluconate (FERRLECIT/NULECIT) IV  125 mg Intravenous Q M,W,F-HD  . folic acid  1 mg Oral Daily  . metolazone  5 mg Oral BID  . midodrine  10 mg Oral TID WC  . ondansetron (ZOFRAN) IV  4 mg Intravenous Q4H  . pantoprazole  40 mg Oral Daily  . predniSONE  60 mg Oral Q breakfast  . sodium chloride  3 mL Intravenous Q12H     Physical Exam: Vital signs in last 24 hours: Temp:  [97.5 F (36.4 C)-97.9 F (36.6 C)] 97.5 F (36.4 C) (01/19 0512) Pulse Rate:  [72-108] 79 (01/19 0854) Resp:  [13-20] 18 (01/19 0512) BP: (98-152)/(53-95) 140/84 mmHg (01/19 0854) SpO2:  [96 %-100 %] 100 % (01/19 0512) Weight:  [127 lb 11.2 oz (57.924 kg)] 127 lb 11.2 oz (57.924 kg) (01/19 0512) Weight change: -1 lb 4.8 oz (-0.59 kg) Last BM Date: 08/27/14  Intake/Output from previous day: 01/18 0701 - 01/19 0700 In: 480 [P.O.:480] Out: 3500      Physical Exam: General- pt is awake,alert, oriented to time place and person Resp- No acute REsp distress,NO Rhonchi, PC in situ CVS- S1S2 regular in rate and rhythm GIT- BS+, soft, distended+  EXT- 1+ LE Edema,NO Cyanosis   Lab Results: CBC  Recent Labs  08/27/14 0811 08/28/14 0616  WBC 11.7* 9.4  HGB 8.7* 8.5*  HCT 26.3* 26.0*  PLT 77* 54*    BMET  Recent Labs  08/27/14 0811 08/28/14 0616  NA 133* 136  K 4.2 4.4  CL 99 100  CO2 24 26  GLUCOSE 88 126*  BUN 44* 28*  CREATININE 2.51* 2.05*  CALCIUM 9.8 9.6   Trend Creat 2016  4.89=>4.53=>5.529 (Initiated on HD 08/18/14 sec to  Anasarca) 2015  2.15=>4.13=>1.68=>1.59=>2.12( SEpt admission)           1.15=>1.95=>1.19 ( July admission)           2.3==>1.21=> 1.08 ( April admission)           3.11==>1.36==>1.18 ( February admission)  MICRO No results found for this or any previous visit (from the past 240 hour(s)).    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

## 2014-08-28 NOTE — Progress Notes (Signed)
Patient arrived back from dialysis in stable condition.  Patient had 2.2 liters of fluid removed in dialysis, dialysis nurse stated patient tolerated procedure well.  bp were stable.  Patient given discharge instructions, iv removed.  Patient verbalized understanding for instructions.  Instructed patient that if she were unable to make scheduled appointments, to please call office to let them know.  Also instructed patient if she were not feeling well, had a temperature, or any dizziness to come back to ed to receive treatment. Patient verbalized understanding.

## 2014-08-28 NOTE — Procedures (Signed)
PreOperative Dx: Cirrhosis due to NASH,ascites Postoperative Dx: Cirrhosis due to NASH, ascites Procedure:   US guided paracentesis Radiologist:  Thornton Papas Anesthesia:  10 ml of 1% lidocaine Specimen:  2400 ml of yellow ascitic fluid EBL:   < 1 ml Complications: None

## 2014-08-28 NOTE — Clinical Social Work Note (Signed)
CSW notified pt of paperwork appointment tomorrow at 8:30 at [facility]. Pt will start dialysis on Thursday at 10:45. Information on AVS as well. Pt to d/c this afternoon.  Benay Pike, [place]

## 2014-08-28 NOTE — Progress Notes (Signed)
Patient BP remaining 140's/80's before and after paracentesis. Discussed with Dyanne Carrel NP. Will hold midodrine.

## 2014-08-28 NOTE — Progress Notes (Signed)
Patient taken down for dialysis at this time.

## 2014-08-30 ENCOUNTER — Other Ambulatory Visit (HOSPITAL_COMMUNITY): Payer: Self-pay

## 2014-08-30 MED ORDER — LORAZEPAM 1 MG PO TABS: 1.0000 mg | ORAL_TABLET | Freq: Two times a day (BID) | ORAL | Status: DC | PRN

## 2014-08-30 MED ORDER — OXYCODONE HCL 10 MG PO TABS: 10.0000 mg | ORAL_TABLET | Freq: Four times a day (QID) | ORAL | Status: DC | PRN

## 2014-09-04 ENCOUNTER — Other Ambulatory Visit (HOSPITAL_COMMUNITY): Payer: Self-pay

## 2014-09-05 ENCOUNTER — Other Ambulatory Visit: Payer: Self-pay

## 2014-09-05 ENCOUNTER — Encounter: Payer: Self-pay | Admitting: Gastroenterology

## 2014-09-05 ENCOUNTER — Ambulatory Visit (INDEPENDENT_AMBULATORY_CARE_PROVIDER_SITE_OTHER): Payer: Medicare Other | Admitting: Gastroenterology

## 2014-09-05 VITALS — BP 135/81 | HR 117 | Temp 97.3°F | Ht 62.0 in | Wt 122.4 lb

## 2014-09-05 DIAGNOSIS — K7581 Nonalcoholic steatohepatitis (NASH): Secondary | ICD-10-CM

## 2014-09-05 DIAGNOSIS — I8500 Esophageal varices without bleeding: Secondary | ICD-10-CM

## 2014-09-05 DIAGNOSIS — K7460 Unspecified cirrhosis of liver: Secondary | ICD-10-CM

## 2014-09-05 DIAGNOSIS — I864 Gastric varices: Principal | ICD-10-CM

## 2014-09-05 DIAGNOSIS — R188 Other ascites: Secondary | ICD-10-CM

## 2014-09-05 MED ORDER — PROMETHAZINE HCL 12.5 MG PO TABS: 12.5000 mg | ORAL_TABLET | Freq: Four times a day (QID) | ORAL | Status: DC | PRN

## 2014-09-05 MED ORDER — OXYCODONE HCL 10 MG PO TABS: 10.0000 mg | ORAL_TABLET | Freq: Four times a day (QID) | ORAL | Status: DC | PRN

## 2014-09-05 NOTE — Progress Notes (Signed)
Subjective:    Patient ID: Wanda [name], female    DOB: November 16, 1975, 39 y.o.   MRN: [id number]  Wanda [name]  HPI CONCERNED ABOUT [facility] [place] AND FLUIDS. APPETITE: GETS ONE BUT BLOATED WHEN SHE EATS. NO NUTRITION COUNSELING. LAST LVP Aug 28, 2014. PT TRYING TO ADHERE TO FLUID RESTRICTION. ON DIALYSIS SINCE JAN 2016. FEELS COLD/SWEATING PERIODICALLY. OCCASIONAL NAUSEA-NO MEDS. TRIED ZOFRAN BUT IT DOESN'T WORK.  BMs: 2-3X/DAY. OCCASIONAL LOOSE/WATERY STOOLS AND TREATED W/ IMODIUM YESTERDAY WATERY. ABD PAIN OFF AND ON IN DIFFERENT PLACES. HAD PORT PLACED IN R CHEST FOR DIALYSIS. NO SOB BUT NOSE IS STOPPED UP. LAST ABX: DEC 2015.  PT DENIES FEVER, CHILLS, HEMATOCHEZIA,  vomiting, melena, CHEST PAIN, SHORTNESS OF BREATH, constipation, problems swallowing, problems with sedation, OR heartburn or indigestion. Past Medical History  Diagnosis Date  . GERD (gastroesophageal reflux disease)   . 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 c/w steatohepatitis  . Folate deficiency 09/2013  . Anasarca 10/10/2013  . Hematemesis/vomiting blood 02/24/2014  . Acute blood loss anemia 02/25/2014    Status post transfusion  . Macrocytosis 02/28/2014  . 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 Acute renal failure 09/2013    Pre-renal- resolved  . C. difficile colitis 04/19/2014  . Fatty liver disease, nonalcoholic 2426   Past Surgical History  Procedure Laterality Date  . None    . Paracentesis  Feb 2015    1180 fluid, negative fluid analysis.   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 Paracentesis  10/2013  . Colonoscopy N/A 12/19/2013    SLF:NO OBVIOUS SOURCE FOR ANEMIA IDETIFIED/ONE COLON POLYP REMOVED/Small internal hemorrhoids  . Esophagogastroduodenoscopy N/A 02/11/2014    Dr. Rourk:Esophageal  varices with bleeding stigmata-status post esophageal band ligation therapy. Portal gastropathy  . Esophagogastroduodenoscopy N/A 07/04/2014    RMR: Persiting grade 2 esophageal varicies with bleeding stigmata status post band ligation. Significantly congested gastric mucosa iwith changes constistant with protal gastropathy.   . Esophageal banding  07/04/2014    Procedure: ESOPHAGEAL BANDING;  Surgeon: Daneil Dolin, MD;  Location: [facility];  Service: Endoscopy;;  . Esophagogastroduodenoscopy (egd) with propofol N/A 07/24/2014    SLF:  1. 2 columns grade 2-3 varices- 2 Bands applied.  2.  Moderate gastropathy 3. Duodenal Diverticula  . Esophageal banding N/A 07/24/2014    Procedure: ESOPHAGEAL BANDING (2 bands applied);  Surgeon: Danie Binder, MD;  Location: [facility];  Service: Endoscopy;  Laterality: N/A;   Allergies  Allergen Reactions  . Lasix [Furosemide]     "doesn't work"    Current Outpatient Prescriptions  Medication Sig Dispense Refill  . folic acid (FOLVITE) 1 MG tablet Take 1 tablet (1 mg total) by mouth daily.    [name] lidocaine (XYLOCAINE) 2 % solution 2 TSP  PO QAC AND HS TO PREVENT FOR CHEST PAIN WHILE EATING (Patient taking differently: Use as directed 10 mLs in the mouth or throat See admin instructions. 2 TSP  PO QAC AND HS TO PREVENT FOR CHEST PAIN WHILE EATING)    . loperamide (IMODIUM) 2 MG capsule Take 1 capsule (2 mg total) by mouth 2 (two) times daily as needed for diarrhea or loose stools.    [name] LORazepam (ATIVAN) 1 MG tablet Take 1 tablet (1 mg total) by mouth every 12 (twelve) hours  as needed for anxiety.    . magnesium oxide (MAG-OX) 400 (241.3 MG) MG tablet Take 1 tablet (400 mg total) by mouth daily. (Patient taking differently: Take 400 mg by mouth once a week. )    . midodrine (PROAMATINE) 10 MG tablet Take 1 tablet (10 mg total) by mouth 2 (two) times daily with a meal. Medication to keep your blood pressure from falling.    . Oxycodone HCl 10 MG TABS Take 1  tablet (10 mg total) by mouth every 6 (six) hours as needed.    . pantoprazole (PROTONIX) 40 MG tablet Take 1 tablet (40 mg total) by mouth daily.    .      . Nutritional Supplements (FEEDING SUPPLEMENT, NEPRO CARB STEADY,) LIQD Take 237 mLs by mouth as needed (missed meal during dialysis.). (Patient not taking: Reported on 09/05/2014)    .      [name]          Review of Systems     Objective:   Physical Exam  Constitutional: She is oriented to person, place, and time. No distress.  Appears frail with WASTING IN HER EXTRERMITIES  HENT:  Head: Normocephalic and atraumatic.  Mouth/Throat: Oropharynx is clear and moist. No oropharyngeal exudate.  Eyes: Pupils are equal, round, and reactive to light. Scleral icterus is present.  Neck: Normal range of motion. Neck supple.  Cardiovascular: Normal rate, regular rhythm and normal heart sounds.   Pulmonary/Chest: Effort normal and breath sounds normal. No respiratory distress. She exhibits tenderness (IN R CHEST PER CAM SITE, NO ERYTHEMA. DRY DRESSING IN PLACE).  Abdominal: Soft. Bowel sounds are normal. She exhibits distension. There is no tenderness. There is no rebound and no guarding.  BULGE AT UMBILICUS EASILY REDUCED., EXAM LIMITED-PT IN [facility]   Musculoskeletal: She exhibits tenderness (NO ERYTHEMA). She exhibits no edema.  Lymphadenopathy:    She has no cervical adenopathy.  Neurological: She is alert and oriented to person, place, and time.  NO  NEW FOCAL DEFICITS   Psychiatric:  FLAT AFFECT, SLIGHTLY DEPRESSED MOOD   Vitals reviewed.         Assessment & Plan:

## 2014-09-05 NOTE — Patient Instructions (Signed)
FOLLOW UP WITH [facility] NEXT WEEK.  YOU WILL GO TO RADIOLOGY TO DRAW OFF THE FLUID IN YOUR ABDOMEN NEXT WEEK.  AVOID SALT AND KEEP YOUR FLUID INTAKE TO 6 TO 8 CUPS A DAY.   I WILL REFER YOU TO NUTRITION FOR TEACHING ON YOUR DIET.  USE PHENERGAN AS NEEDED FOR NAUSEA.  USE OXYCODONE EVERY 6 HOURS AS NEEDED FOR PAIN.  FOLLOW UP IN 4 MOS.

## 2014-09-05 NOTE — Assessment & Plan Note (Addendum)
LAST EGD/EVL DEC 2015. NO SIGNS OR SYMPTOMS OF ACTIVE GI BLEED.   Needs EGD/?EVL  W/ MAC FEB 2016. CIPRO 400 MG IV IN PREOP FOLLOW UP IN 4 MOS.

## 2014-09-05 NOTE — Assessment & Plan Note (Signed)
UNCONTROLLED.  PARACENTESIS NEXT WEEK  AVOID SALT RESTRICT FLUID DIALYSIS QTTHSAT

## 2014-09-05 NOTE — Assessment & Plan Note (Addendum)
SEEN A [facility] UDS POS FOR [facility]C. PT AWARE.  AVOID CANNABIS. OPV [facility] FEB 1 PER PT REQUEST, large volume paracentesis AFTER FEB 1 ON MWF.   ALBUMIN 25 GMS IV AT ONSET AND REPEAT AFTER 4 LS.  SHE NEEDS TO STRICTLY FOLLOW A LOW SALT DIET. NUTRITION CONSULT FOR FLUID/Na RESTRICTION.  PHENERGAN PRN FOR NAUSEA FOLLOW UP IN 4 MOS.

## 2014-09-05 NOTE — Progress Notes (Signed)
cc'ed to pcp °

## 2014-09-05 NOTE — Progress Notes (Signed)
ON RECALL LIST  °

## 2014-09-06 ENCOUNTER — Ambulatory Visit (HOSPITAL_COMMUNITY): Payer: Self-pay | Admitting: Hematology & Oncology

## 2014-09-06 ENCOUNTER — Other Ambulatory Visit (HOSPITAL_COMMUNITY): Payer: Self-pay

## 2014-09-06 LAB — CHROMOSOME ANALYSIS, BONE MARROW

## 2014-09-11 NOTE — Progress Notes (Signed)
Wanda [name] [place] [id number]  Thrombocytopenia - Plan: CBC with Differential, CBC with Differential  Macrocytosis - Plan: Vitamin B12, Folate  CURRENT THERAPY: Observation  INTERVAL HISTORY: Wanda [name] 39 y.o. female returns for followup of thrombocytopenia in the setting of cirrhosis secondary to NASH with transplantation evaluation at [facility], and chronic renal disease with multiple missed appointments since discharge from the hospital.    Missed appointments: 09/06/14 MD appt- cancelled 09/06/2014 Lab appt- cancelled 09/04/2014 lab appt- no show 08/30/2014 lab appt- no show  I personally reviewed and went over laboratory results with the patient.  The results are noted within this dictation.  Her Hgb and platelets are recovering nicely.   She reports that she is off her steroids which is good.  She is on dialysis Tuesday/Thursday/Saturdays.    Hematologically, she denies any complaints and ROS questioning is negative.   Past Medical History  Diagnosis Date  . GERD (gastroesophageal reflux disease)   . 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 c/w steatohepatitis  . Folate deficiency 09/2013  . Anasarca 10/10/2013  . Hematemesis/vomiting blood 02/24/2014  . Acute blood loss anemia 02/25/2014    Status post transfusion  . Macrocytosis 02/28/2014  . 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 Acute renal failure 09/2013    Pre-renal- resolved  . C. difficile colitis 04/19/2014  . Fatty liver disease, nonalcoholic 2641    has Ascites; PNA (pneumonia); Folate deficiency; Liver cirrhosis secondary to nonalcoholic steatohepatitis (NASH); SBP (spontaneous bacterial peritonitis); Bipolar disorder, unspecified; C. difficile colitis; Hepatic encephalopathy; Malnutrition of moderate degree; Esophageal varices; Portal hypertensive gastropathy; Thrombocytopenia; Absolute anemia; and Macrocytosis  on her problem list.     is allergic to lasix.  Wanda [name] does not currently have medications on file.  Past Surgical History  Procedure Laterality Date  . None    . Paracentesis  Feb 2015    1180 fluid, negative fluid analysis.   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 Paracentesis  10/2013  . Colonoscopy N/A 12/19/2013    SLF:NO OBVIOUS SOURCE FOR ANEMIA IDETIFIED/ONE COLON POLYP REMOVED/Small internal hemorrhoids  . Esophagogastroduodenoscopy N/A 02/11/2014    Dr. Rourk:Esophageal varices with bleeding stigmata-status post esophageal band ligation therapy. Portal gastropathy  . Esophagogastroduodenoscopy N/A 07/04/2014    RMR: Persiting grade 2 esophageal varicies with bleeding stigmata status post band ligation. Significantly congested gastric mucosa iwith changes constistant with protal gastropathy.   . Esophageal banding  07/04/2014    Procedure: ESOPHAGEAL BANDING;  Surgeon: Daneil Dolin, MD;  Location: [facility];  Service: Endoscopy;;  . Esophagogastroduodenoscopy (egd) with propofol N/A 07/24/2014    SLF:  1. 2 columns grade 2-3 varices- 2 Bands applied.  2.  Moderate gastropathy 3. Duodenal Diverticula  . Esophageal banding N/A 07/24/2014    Procedure: ESOPHAGEAL BANDING (2 bands applied);  Surgeon: Danie Binder, MD;  Location: [facility];  Service: Endoscopy;  Laterality: N/A;    Denies any headaches, dizziness, double vision, fevers, chills, night sweats, nausea, vomiting, diarrhea, constipation, chest pain, heart palpitations, shortness of breath, blood in stool, black tarry stool, urinary pain, urinary burning, urinary frequency, hematuria.   PHYSICAL EXAMINATION  ECOG PERFORMANCE STATUS: 1 - Symptomatic but completely ambulatory  Filed Vitals:   09/12/14 1100  BP: 116/53  Pulse: 90  Temp: 98.9 F (37.2 C)  Resp: 18     GENERAL:alert, no distress, well nourished, well developed, comfortable, cooperative and smiling SKIN: skin color, texture, turgor are normal, no rashes or significant lesions HEAD: Normocephalic, No masses, lesions, tenderness or abnormalities EYES: normal, PERRLA, EOMI, Conjunctiva are pink and non-injected EARS: External ears normal OROPHARYNX:mucous membranes are moist  NECK: supple, trachea midline LYMPH:  not examined BREAST:not examined LUNGS: clear to auscultation  HEART: regular rate & rhythm ABDOMEN:abdomen soft and normal bowel sounds BACK: Back symmetric, no curvature. EXTREMITIES:less then 2 second capillary refill, no joint deformities, effusion, or inflammation, no skin discoloration, no cyanosis  NEURO: alert & oriented x 3 with fluent speech, no focal motor/sensory deficits, in wheelchair   LABORATORY DATA: CBC    Component Value Date/Time   WBC 8.5 09/12/2014 1044   RBC 3.11* 09/12/2014 1044   RBC 2.31* 11/06/2013 1333   HGB 10.3* 09/12/2014 1044   HCT 32.3* 09/12/2014 1044   PLT 139* 09/12/2014 1044   MCV 103.9* 09/12/2014 1044   MCH 33.1 09/12/2014 1044   MCHC 31.9 09/12/2014 1044   RDW 19.2* 09/12/2014 1044   LYMPHSABS 1.7 08/13/2014 2121   MONOABS 0.5 08/13/2014 2121   EOSABS 0.1 08/13/2014 2121   BASOSABS 0.1 08/13/2014 2121      Chemistry      Component Value Date/Time   NA 136 08/28/2014 0616   K 4.4 08/28/2014 0616   CL 100 08/28/2014 0616   CO2 26 08/28/2014 0616   BUN 28* 08/28/2014 0616   CREATININE 2.05* 08/28/2014 0616   CREATININE 1.46* 06/13/2014 0959      Component Value Date/Time   CALCIUM 9.6 08/28/2014 0616   ALKPHOS 85 08/14/2014 0926   AST 50* 08/14/2014 0926   ALT 11 08/18/2014 1226   BILITOT 1.6* 08/14/2014 0926       RADIOGRAPHIC STUDIES:  US Renal  08/14/2014   CLINICAL DATA:  39 year old with hepatic cirrhosis and portal hypertension, presenting with acute renal failure.  EXAM: RENAL/URINARY TRACT ULTRASOUND  COMPLETE  COMPARISON:  No prior urinary tract ultrasound. CT abdomen and pelvis 09/11/2013.  FINDINGS: Right Kidney:  Length: Approximately 11.0 cm. Echogenic parenchyma. Well-preserved cortex. No focal parenchymal abnormality. No hydronephrosis.  Left Kidney:  Length: Approximately 10.1 cm. Echogenic parenchyma. Well-preserved cortex. No focal parenchymal abnormality. No hydronephrosis.  Bladder:  Likely decompressed, as it was not clearly visualized in the pelvis.  Other findings:  Large amount of pelvic ascites.  IMPRESSION: 1. No evidence of hydronephrosis involving either kidney. 2. Echogenic renal parenchyma bilaterally consistent with medical renal disease. 3. Large amount of pelvic ascites.   Electronically Signed   By: Evangeline Dakin M.D.   On: 08/14/2014 15:46   US Paracentesis  08/28/2014   CLINICAL DATA:  Cirrhosis secondary to NASH, recurrent ascites  EXAM: ULTRASOUND GUIDED THERAPEUTIC PARACENTESIS  COMPARISON:  08/16/2014  PROCEDURE: Procedure, benefits, and risks of procedure were discussed with patient.  Written informed consent for procedure was obtained.  Time out protocol followed.  Adequate collection of ascites localized by ultrasound in RIGHT lower quadrant.  Skin prepped and draped in usual sterile fashion.  Skin and soft tissues anesthetized with 10 mL of 1% lidocaine.  5 [place] Yueh catheter placed into peritoneal cavity.  2400 mL of yellow fluid aspirated by vacuum bottle suction.  Procedure tolerated well by patient without immediate complication.  FINDINGS: As above  IMPRESSION: Successful ultrasound guided paracentesis yielding 2400 mL of  ascites.   Electronically Signed   By: Lavonia Dana M.D.   On: 08/28/2014 12:02   US Paracentesis  08/16/2014   CLINICAL DATA:  Cirrhosis due to NASH, ascites  EXAM: ULTRASOUND GUIDED DIAGNOSTIC AND THERAPEUTIC PARACENTESIS  COMPARISON:  08/14/2014 renal ultrasound  PROCEDURE: Procedure, benefits, and risks of procedure were discussed with  patient.  Written informed consent for procedure was obtained.  Time out protocol followed.  Adequate collection of ascites localized by ultrasound in RIGHT lower quadrant.  Skin prepped and draped in usual sterile fashion.  Skin and soft tissues anesthetized with 10 mL of 1% lidocaine.  5 [place] Yueh catheter placed into peritoneal cavity.  1000 mL of yellow fluid aspirated by vacuum bottle suction.  Volume of ascites removed was limited to 1 L at request of referring physician due to low blood pressure.  Procedure tolerated well by patient without immediate complication.  FINDINGS: A total of approximately 1000 mL of ascitic fluid was removed. A fluid sample of 180 mL was sent for laboratory analysis.  IMPRESSION: Successful ultrasound guided paracentesis yielding 1000 mL of ascites.   Electronically Signed   By: Lavonia Dana M.D.   On: 08/16/2014 12:03   Ir Fluoro Guide Cv Line Right  08/18/2014   INDICATION: End-stage renal disease. In need of intravenous access for the initiation of dialysis.  EXAM: TUNNELED CENTRAL VENOUS HEMODIALYSIS CATHETER PLACEMENT WITH ULTRASOUND AND FLUOROSCOPIC GUIDANCE  MEDICATIONS: Ancef 2 gm IV; The IV antibiotic was given in an appropriate time interval prior to skin puncture.  CONTRAST:  None  ANESTHESIA/SEDATION: Versed 1 mg IV; Fentanyl 50 mcg IV  Total Moderate Sedation Time  15 minutes.  FLUOROSCOPY TIME:  24 seconds (4.1 mGy)  COMPLICATIONS: None immediate  PROCEDURE: Informed written consent was obtained from the patient after a discussion of the risks, benefits, and alternatives to treatment. Questions regarding the procedure were encouraged and answered. The right neck and chest were prepped with chlorhexidine in a sterile fashion, and a sterile drape was applied covering the operative field. Maximum barrier sterile technique with sterile gowns and gloves were used for the procedure. A timeout was performed prior to the initiation of the procedure.  After creating a small  venotomy incision, a micropuncture kit was utilized to access the right internal jugular vein under direct, real-time ultrasound guidance after the overlying soft tissues were anesthetized with 1% lidocaine with epinephrine. Ultrasound image documentation was performed. The microwire was kinked to measure appropriate catheter length. A stiff Glidewire was advanced to the level of the IVC and the micropuncture sheath was exchanged for a peel-away sheath. A hemosplit tunneled hemodialysis catheter measuring 19 cm from tip to cuff was tunneled in a retrograde fashion from the anterior chest wall to the venotomy incision.  The catheter was then placed through the peel-away sheath with tips ultimately positioned within the superior aspect of the right atrium. Final catheter positioning was confirmed and documented with a spot radiographic image. The catheter aspirates and flushes normally. The catheter was flushed with appropriate volume heparin dwells.  The catheter exit site was secured with a 0-Prolene retention suture. The venotomy incision was closed with an interrupted 4-0 Vicryl, Dermabond and Steri-strips. Dressings were applied. The patient tolerated the procedure well without immediate post procedural complication.  IMPRESSION: Successful placement of 19 cm tip to cuff tunneled hemodialysis catheter via the right internal jugular vein with tips terminating within the superior aspect of the right atrium. The catheter is ready for immediate use.  Electronically Signed   By: Sandi Mariscal M.D.   On: 08/18/2014 11:36   Ir US Guide Vasc Access Right  08/18/2014   INDICATION: End-stage renal disease. In need of intravenous access for the initiation of dialysis.  EXAM: TUNNELED CENTRAL VENOUS HEMODIALYSIS CATHETER PLACEMENT WITH ULTRASOUND AND FLUOROSCOPIC GUIDANCE  MEDICATIONS: Ancef 2 gm IV; The IV antibiotic was given in an appropriate time interval prior to skin puncture.  CONTRAST:  None  ANESTHESIA/SEDATION:  Versed 1 mg IV; Fentanyl 50 mcg IV  Total Moderate Sedation Time  15 minutes.  FLUOROSCOPY TIME:  24 seconds (4.1 mGy)  COMPLICATIONS: None immediate  PROCEDURE: Informed written consent was obtained from the patient after a discussion of the risks, benefits, and alternatives to treatment. Questions regarding the procedure were encouraged and answered. The right neck and chest were prepped with chlorhexidine in a sterile fashion, and a sterile drape was applied covering the operative field. Maximum barrier sterile technique with sterile gowns and gloves were used for the procedure. A timeout was performed prior to the initiation of the procedure.  After creating a small venotomy incision, a micropuncture kit was utilized to access the right internal jugular vein under direct, real-time ultrasound guidance after the overlying soft tissues were anesthetized with 1% lidocaine with epinephrine. Ultrasound image documentation was performed. The microwire was kinked to measure appropriate catheter length. A stiff Glidewire was advanced to the level of the IVC and the micropuncture sheath was exchanged for a peel-away sheath. A hemosplit tunneled hemodialysis catheter measuring 19 cm from tip to cuff was tunneled in a retrograde fashion from the anterior chest wall to the venotomy incision.  The catheter was then placed through the peel-away sheath with tips ultimately positioned within the superior aspect of the right atrium. Final catheter positioning was confirmed and documented with a spot radiographic image. The catheter aspirates and flushes normally. The catheter was flushed with appropriate volume heparin dwells.  The catheter exit site was secured with a 0-Prolene retention suture. The venotomy incision was closed with an interrupted 4-0 Vicryl, Dermabond and Steri-strips. Dressings were applied. The patient tolerated the procedure well without immediate post procedural complication.  IMPRESSION: Successful  placement of 19 cm tip to cuff tunneled hemodialysis catheter via the right internal jugular vein with tips terminating within the superior aspect of the right atrium. The catheter is ready for immediate use.   Electronically Signed   By: Sandi Mariscal M.D.   On: 08/18/2014 11:36   Ct Biopsy  08/27/2014   CLINICAL DATA:  Unexplained thrombocytopenia. The patient requires a bone marrow biopsy.  EXAM: CT GUIDED ASPIRATE AND CORE RIGHT ILIAC BONE MARROW BIOPSY  ANESTHESIA/SEDATION: Versed 2.0 mg IV, Fentanyl 100 mcg IV  Total Moderate Sedation Time  7 minutes.  PROCEDURE: The procedure risks, benefits, and alternatives were explained to the patient. Questions regarding the procedure were encouraged and answered. The patient understands and consents to the procedure.  The right gluteal region was prepped with Betadine. Sterile gown and sterile gloves were used for the procedure. Local anesthesia was provided with 1% Lidocaine. A time-out was performed prior to the procedure.  Under CT guidance, an 11 gauge OnControl bone cutting needle was advanced from a posterior approach into the right iliac bone. Needle positioning was confirmed with CT. Initial non heparinized and heparinized aspirate samples were obtained of bone marrow. Core biopsy was performed with the outer biopsy needle.  COMPLICATIONS: None  FINDINGS: Inspection of initial aspirate did reveal visible particles.  Intact core biopsy sample was obtained.  IMPRESSION: CT guided bone marrow biopsy of right posterior iliac bone with both aspirate and core samples obtained.   Electronically Signed   By: Aletta Edouard M.D.   On: 08/27/2014 13:27      ASSESSMENT AND PLAN:  Thrombocytopenia Patient was seen initially in the hospital in Jan 2016 for thrombocytopenia.  Work-up was negative and she was started on high dose steroids with hopes of improvement.  Bone marrow aspiration and biopsy on 08/27/2014 was negative.  Significant improvement not noted during  hospitalization.  She was therefore tapered as an outpatient following discharge.  Labs performed today and they demonstrate a significant improvement in anemia and thrombocytopenia.  Suspected etiology is uremia.  Labs in 2 weeks and 4 weeks: CBC diff.  Labs in 2 weeks: Vitamin B 12 and Folate. Return in 4 weeks for follow-up.   Macrocytosis Will check B12 and Folate in 2 weeks to rule out deficiencies.    THERAPY PLAN:  We will perform labs in 2 and 4 weeks.  If labs continue to improve, we will release her from the clinic.  All questions were answered. The patient knows to call the clinic with any problems, questions or concerns. We can certainly see the patient much sooner if necessary.  Patient and plan discussed with Dr. Ancil Linsey and she is in agreement with the aforementioned.   Wanda [name] 09/12/2014

## 2014-09-12 ENCOUNTER — Encounter (HOSPITAL_COMMUNITY): Payer: Self-pay | Admitting: Oncology

## 2014-09-12 ENCOUNTER — Encounter (HOSPITAL_BASED_OUTPATIENT_CLINIC_OR_DEPARTMENT_OTHER): Payer: Medicare Other

## 2014-09-12 ENCOUNTER — Encounter (HOSPITAL_COMMUNITY): Payer: Medicare Other | Attending: Gastroenterology | Admitting: Oncology

## 2014-09-12 VITALS — BP 116/53 | HR 90 | Temp 98.9°F | Resp 18 | Wt 116.0 lb

## 2014-09-12 DIAGNOSIS — D696 Thrombocytopenia, unspecified: Secondary | ICD-10-CM

## 2014-09-12 DIAGNOSIS — D7589 Other specified diseases of blood and blood-forming organs: Secondary | ICD-10-CM | POA: Insufficient documentation

## 2014-09-12 DIAGNOSIS — I8501 Esophageal varices with bleeding: Secondary | ICD-10-CM | POA: Diagnosis not present

## 2014-09-12 LAB — CBC
HCT: 32.3 % — ABNORMAL LOW (ref 36.0–46.0)
HEMOGLOBIN: 10.3 g/dL — AB (ref 12.0–15.0)
MCH: 33.1 pg (ref 26.0–34.0)
MCHC: 31.9 g/dL (ref 30.0–36.0)
MCV: 103.9 fL — ABNORMAL HIGH (ref 78.0–100.0)
Platelets: 139 10*3/uL — ABNORMAL LOW (ref 150–400)
RBC: 3.11 MIL/uL — ABNORMAL LOW (ref 3.87–5.11)
RDW: 19.2 % — ABNORMAL HIGH (ref 11.5–15.5)
WBC: 8.5 10*3/uL (ref 4.0–10.5)

## 2014-09-12 NOTE — Patient Instructions (Signed)
[facility]  Discharge Instructions:  Labs in 2 weeks and 4 weeks.  Return in 4 weeks for follow-up. _______________________________________________________________  Thank you for choosing [place] at [facility] to provide your oncology and hematology care.  To afford each patient quality time with our providers, please arrive at least 15 minutes before your scheduled appointment.  You need to re-schedule your appointment if you arrive 10 or more minutes late.  We strive to give you quality time with our providers, and arriving late affects you and other patients whose appointments are after yours.  Also, if you no show three or more times for appointments you may be dismissed from the clinic.  Again, thank you for choosing [facility] hope is that these requests will allow you access to exceptional care and in a timely manner. _______________________________________________________________  If you have questions after your visit, please contact our office at [phone] between the hours of 8:30 a.m. and 5:00 p.m. Voicemails left after 4:30 p.m. will not be returned until the following business day. _______________________________________________________________  For prescription refill requests, have your pharmacy contact our office. _______________________________________________________________  Recommendations made by the consultant and any test results will be sent to your referring physician. _______________________________________________________________

## 2014-09-12 NOTE — Assessment & Plan Note (Signed)
Will check B12 and Folate in 2 weeks to rule out deficiencies.

## 2014-09-12 NOTE — Patient Instructions (Signed)
KARAGAN LEHR  09/12/2014   Your procedure is scheduled on:   09/18/2014  Report to [facility] at  700  AM.  Call this number if you have problems the morning of surgery: [phone]   Remember:   Do not eat food or drink liquids after midnight.   Take these medicines the morning of surgery with A SIP OF WATER: lorazepam(ativan), midodrine, oxycodone, protonix, phenergan   Do not wear jewelry, make-up or nail polish.  Do not wear lotions, powders, or perfumes.   Do not shave 48 hours prior to surgery. Men may shave face and neck.  Do not bring valuables to the hospital.  [facility] is not responsible for any belongings or valuables.               Contacts, dentures or bridgework may not be worn into surgery.  Leave suitcase in the car. After surgery it may be brought to your room.  For patients admitted to the hospital, discharge time is determined by your treatment team.               Patients discharged the day of surgery will not be allowed to drive home.  Name and phone number of your driver: family  Special Instructions: N/A   Please read over the following fact sheets that you were given: Pain Booklet, Coughing and Deep Breathing, Surgical Site Infection Prevention, Anesthesia Post-op Instructions and Care and Recovery After Surgery  Esophagogastroduodenoscopy Esophagogastroduodenoscopy (EGD) is a procedure to examine the lining of the esophagus, stomach, and first part of the small intestine (duodenum). A long, flexible, lighted tube with a camera attached (endoscope) is inserted down the throat to view these organs. This procedure is done to detect problems or abnormalities, such as inflammation, bleeding, ulcers, or growths, in order to treat them. The procedure lasts about 5-20 minutes. It is usually an outpatient procedure, but it may need to be performed in emergency cases in the hospital. LET YOUR CAREGIVER KNOW ABOUT:   Allergies to food or  medicine.    All medicines you are taking, including vitamins, herbs, eyedrops, and over-the-counter medicines and creams.  Use of steroids (by mouth or creams).  Previous problems you or members of your family have had with the use of anesthetics.  Any blood disorders you have.  Previous surgeries you have had.  Other health problems you have.  Possibility of pregnancy, if this applies. RISKS AND COMPLICATIONS  Generally, EGD is a safe procedure. However, as with any procedure, complications can occur. Possible complications include:  Infection.  Bleeding.  Tearing (perforation) of the esophagus, stomach, or duodenum.  Difficulty breathing or not being able to breath.  Excessive sweating.  Spasms of the larynx.  Slowed heartbeat.  Low blood pressure. BEFORE THE PROCEDURE  Do not eat or drink anything for 6-8 hours before the procedure or as directed by your caregiver.  Ask your caregiver about changing or stopping your regular medicines.  If you wear dentures, be prepared to remove them before the procedure.  Arrange for someone to drive you home after the procedure. PROCEDURE   A vein will be accessed to give medicines and fluids. A medicine to relax you (sedative) and a pain reliever will be given through that access into the vein.  A numbing medicine (local anesthetic) may be sprayed on your throat for comfort and to stop you from gagging or coughing.  A mouth guard may be placed in  your mouth to protect your teeth and to keep you from biting on the endoscope.  You will be asked to lie on your left side.  The endoscope is inserted down your throat and into the esophagus, stomach, and duodenum.  Air is put through the endoscope to allow your caregiver to view the lining of your esophagus clearly.  The esophagus, stomach, and duodenum is then examined. During the exam, your caregiver may:  Remove tissue to be examined under a microscope (biopsy) for  inflammation, infection, or other medical problems.  Remove growths.  Remove objects (foreign bodies) that are stuck.  Treat any bleeding with medicines or other devices that stop tissues from bleeding (hot cautery, clipping devices).  Widen (dilate) or stretch narrowed areas of the esophagus and stomach.  The endoscope will then be withdrawn. AFTER THE PROCEDURE  You will be taken to a recovery area to be monitored. You will be able to go home once you are stable and alert.  Do not eat or drink anything until the local anesthetic and numbing medicines have worn off. You may choke.  It is normal to feel bloated, have pain with swallowing, or have a sore throat for a short time. This will wear off.  Your caregiver should be able to discuss his or her findings with you. It will take longer to discuss the test results if any biopsies were taken. Document Released: 11/27/2004 Document Revised: 12/11/2013 Document Reviewed: 06/29/2012 [facility] Patient Information 2015 [place], [place]. This information is not intended to replace advice given to you by your health care provider. Make sure you discuss any questions you have with your health care provider. PATIENT INSTRUCTIONS POST-ANESTHESIA  IMMEDIATELY FOLLOWING SURGERY:  Do not drive or operate machinery for the first twenty four hours after surgery.  Do not make any important decisions for twenty four hours after surgery or while taking narcotic pain medications or sedatives.  If you develop intractable nausea and vomiting or a severe headache please notify your doctor immediately.  FOLLOW-UP:  Please make an appointment with your surgeon as instructed. You do not need to follow up with anesthesia unless specifically instructed to do so.  WOUND CARE INSTRUCTIONS (if applicable):  Keep a dry clean dressing on the anesthesia/puncture wound site if there is drainage.  Once the wound has quit draining you may leave it open to air.  Generally you  should leave the bandage intact for twenty four hours unless there is drainage.  If the epidural site drains for more than 36-48 hours please call the anesthesia department.  QUESTIONS?:  Please feel free to call your physician or the hospital operator if you have any questions, and they will be happy to assist you.

## 2014-09-12 NOTE — Progress Notes (Signed)
Labs for cbc 

## 2014-09-12 NOTE — Assessment & Plan Note (Addendum)
Patient was seen initially in the hospital in Jan 2016 for thrombocytopenia.  Work-up was negative and she was started on high dose steroids with hopes of improvement.  Bone marrow aspiration and biopsy on 08/27/2014 was negative.  Significant improvement not noted during hospitalization.  She was therefore tapered as an outpatient following discharge.  Labs performed today and they demonstrate a significant improvement in anemia and thrombocytopenia.  Suspected etiology is uremia.  Labs in 2 weeks and 4 weeks: CBC diff.  Labs in 2 weeks: Vitamin B 12 and Folate. Return in 4 weeks for follow-up.

## 2014-09-14 ENCOUNTER — Encounter (HOSPITAL_COMMUNITY)
Admission: RE | Admit: 2014-09-14 | Discharge: 2014-09-14 | Disposition: A | Discharge status: Home / Self Care | Payer: Medicare Other | Source: Ambulatory Visit | Attending: Gastroenterology | Admitting: Gastroenterology

## 2014-09-14 ENCOUNTER — Ambulatory Visit (HOSPITAL_COMMUNITY)
Admission: RE | Admit: 2014-09-14 | Discharge: 2014-09-14 | Disposition: A | Discharge status: Home / Self Care | Payer: Medicare Other | Source: Ambulatory Visit | Attending: Gastroenterology | Admitting: Gastroenterology

## 2014-09-14 ENCOUNTER — Other Ambulatory Visit: Payer: Self-pay

## 2014-09-14 ENCOUNTER — Encounter (HOSPITAL_COMMUNITY): Payer: Self-pay

## 2014-09-14 DIAGNOSIS — K254 Chronic or unspecified gastric ulcer with hemorrhage: Secondary | ICD-10-CM | POA: Diagnosis not present

## 2014-09-14 DIAGNOSIS — R188 Other ascites: Secondary | ICD-10-CM | POA: Insufficient documentation

## 2014-09-14 DIAGNOSIS — K920 Hematemesis: Secondary | ICD-10-CM | POA: Diagnosis not present

## 2014-09-14 HISTORY — DX: Depression, unspecified: F32.A

## 2014-09-14 HISTORY — DX: Anxiety disorder, unspecified: F41.9

## 2014-09-14 HISTORY — DX: Thrombocytopenia, unspecified: D69.6

## 2014-09-14 HISTORY — DX: Major depressive disorder, single episode, unspecified: F32.9

## 2014-09-14 MED ORDER — ALBUMIN HUMAN 25 % IV SOLN
INTRAVENOUS | Status: AC
  Filled 2014-09-14: qty 100

## 2014-09-14 MED ORDER — ALBUMIN HUMAN 25 % IV SOLN
25.0000 g | Freq: Once | INTRAVENOUS | Status: AC
  Administered 2014-09-14: 25 g via INTRAVENOUS

## 2014-09-14 NOTE — Progress Notes (Signed)
Paracentesis complete no signs of distress. 4700 ml yellow colored ascites removed.

## 2014-09-15 ENCOUNTER — Inpatient Hospital Stay (HOSPITAL_COMMUNITY)
Admission: EM | Admit: 2014-09-15 | Discharge: 2014-09-19 | DRG: 377 | Disposition: A | Discharge status: Home / Self Care | Payer: Medicare Other | Attending: Internal Medicine | Admitting: Internal Medicine

## 2014-09-15 ENCOUNTER — Encounter (HOSPITAL_COMMUNITY): Payer: Self-pay | Admitting: *Deleted

## 2014-09-15 DIAGNOSIS — K254 Chronic or unspecified gastric ulcer with hemorrhage: Principal | ICD-10-CM | POA: Diagnosis present

## 2014-09-15 DIAGNOSIS — Z87891 Personal history of nicotine dependence: Secondary | ICD-10-CM

## 2014-09-15 DIAGNOSIS — K920 Hematemesis: Secondary | ICD-10-CM | POA: Diagnosis present

## 2014-09-15 DIAGNOSIS — I959 Hypotension, unspecified: Secondary | ICD-10-CM

## 2014-09-15 DIAGNOSIS — Z8249 Family history of ischemic heart disease and other diseases of the circulatory system: Secondary | ICD-10-CM

## 2014-09-15 DIAGNOSIS — D696 Thrombocytopenia, unspecified: Secondary | ICD-10-CM | POA: Diagnosis present

## 2014-09-15 DIAGNOSIS — K766 Portal hypertension: Secondary | ICD-10-CM | POA: Diagnosis present

## 2014-09-15 DIAGNOSIS — K219 Gastro-esophageal reflux disease without esophagitis: Secondary | ICD-10-CM | POA: Diagnosis present

## 2014-09-15 DIAGNOSIS — D62 Acute posthemorrhagic anemia: Secondary | ICD-10-CM | POA: Diagnosis present

## 2014-09-15 DIAGNOSIS — K921 Melena: Secondary | ICD-10-CM

## 2014-09-15 DIAGNOSIS — I9589 Other hypotension: Secondary | ICD-10-CM | POA: Diagnosis present

## 2014-09-15 DIAGNOSIS — Z6821 Body mass index (BMI) 21.0-21.9, adult: Secondary | ICD-10-CM

## 2014-09-15 DIAGNOSIS — E876 Hypokalemia: Secondary | ICD-10-CM | POA: Diagnosis present

## 2014-09-15 DIAGNOSIS — E440 Moderate protein-calorie malnutrition: Secondary | ICD-10-CM | POA: Diagnosis present

## 2014-09-15 DIAGNOSIS — K7460 Unspecified cirrhosis of liver: Secondary | ICD-10-CM | POA: Diagnosis present

## 2014-09-15 DIAGNOSIS — F319 Bipolar disorder, unspecified: Secondary | ICD-10-CM | POA: Diagnosis present

## 2014-09-15 DIAGNOSIS — R188 Other ascites: Secondary | ICD-10-CM | POA: Diagnosis present

## 2014-09-15 DIAGNOSIS — K259 Gastric ulcer, unspecified as acute or chronic, without hemorrhage or perforation: Secondary | ICD-10-CM | POA: Diagnosis present

## 2014-09-15 DIAGNOSIS — Z992 Dependence on renal dialysis: Secondary | ICD-10-CM

## 2014-09-15 DIAGNOSIS — I8500 Esophageal varices without bleeding: Secondary | ICD-10-CM | POA: Diagnosis present

## 2014-09-15 DIAGNOSIS — K3189 Other diseases of stomach and duodenum: Secondary | ICD-10-CM | POA: Diagnosis present

## 2014-09-15 DIAGNOSIS — K7581 Nonalcoholic steatohepatitis (NASH): Secondary | ICD-10-CM | POA: Diagnosis present

## 2014-09-15 DIAGNOSIS — N186 End stage renal disease: Secondary | ICD-10-CM | POA: Diagnosis present

## 2014-09-15 HISTORY — DX: Other ascites: R18.8

## 2014-09-15 HISTORY — DX: Dependence on renal dialysis: Z99.2

## 2014-09-15 HISTORY — DX: Unspecified cirrhosis of liver: K74.60

## 2014-09-15 HISTORY — DX: Spontaneous bacterial peritonitis: K65.2

## 2014-09-15 HISTORY — DX: Bipolar disorder, unspecified: F31.9

## 2014-09-15 HISTORY — DX: Other hypotension: I95.89

## 2014-09-15 HISTORY — DX: Gastric ulcer, unspecified as acute or chronic, without hemorrhage or perforation: K25.9

## 2014-09-15 HISTORY — DX: End stage renal disease: N18.6

## 2014-09-15 HISTORY — DX: Pneumonia, unspecified organism: J18.9

## 2014-09-15 LAB — POC OCCULT BLOOD, ED: FECAL OCCULT BLD: POSITIVE — AB

## 2014-09-15 MED ORDER — SODIUM CHLORIDE 0.9 % IV SOLN
80.0000 mg | Freq: Once | INTRAVENOUS | Status: AC
  Administered 2014-09-16: 80 mg via INTRAVENOUS
  Filled 2014-09-15: qty 80

## 2014-09-15 MED ORDER — PANTOPRAZOLE SODIUM 40 MG IV SOLR: 40.0000 mg | Freq: Two times a day (BID) | INTRAVENOUS | Status: DC

## 2014-09-15 MED ORDER — OCTREOTIDE LOAD VIA INFUSION
50.0000 ug | Freq: Once | INTRAVENOUS | Status: AC
  Administered 2014-09-16: 50 ug via INTRAVENOUS
  Filled 2014-09-15: qty 25

## 2014-09-15 MED ORDER — SODIUM CHLORIDE 0.9 % IV BOLUS (SEPSIS)
500.0000 mL | Freq: Once | INTRAVENOUS | Status: AC
  Administered 2014-09-15: 500 mL via INTRAVENOUS

## 2014-09-15 MED ORDER — SODIUM CHLORIDE 0.9 % IV SOLN
50.0000 ug/h | INTRAVENOUS | Status: DC
  Administered 2014-09-16 – 2014-09-18 (×5): 50 ug/h via INTRAVENOUS
  Filled 2014-09-15 (×9): qty 1

## 2014-09-15 MED ORDER — SODIUM CHLORIDE 0.9 % IV SOLN
8.0000 mg/h | INTRAVENOUS | Status: DC
  Administered 2014-09-16 – 2014-09-18 (×5): 8 mg/h via INTRAVENOUS
  Filled 2014-09-15 (×8): qty 80

## 2014-09-15 NOTE — ED Notes (Signed)
Pt has had 2 episodes of hematemesis and 1 black stool just PTA.

## 2014-09-15 NOTE — ED Provider Notes (Addendum)
TIME SEEN: 11:45 PM  CHIEF COMPLAINT: Hematemesis, melena  HPI: Pt is a 39 y.o. female with history of cirrhosis of liver secondary to NASH, frequent episodes of hematemesis, melena secondary to esophageal varices status post banding last in December 2015 as well as chronic thrombocytopenia, end-stage renal disease on hemodialysis Tuesday, Thursday and Saturday who was last dialyzed today who presents emergency department with hematemesis and melena. Patient is a very vague historian and it is difficult to tell when this started but likely today or yesterday. She denies any new abdominal pain. She has chronic right upper quadrant pain. She is unable to tell me when her last transfusion was. Her blood pressure today is slightly low and she is tachycardic but she states she is on medications to bring her blood pressure up. Not on any antihypertensive medications.  Gastroenterologist is Dr. Oneida Alar.  ROS: See HPI Constitutional: no fever  Eyes: no drainage  ENT: no runny nose   Cardiovascular:  no chest pain  Resp: no SOB  GI:  vomiting GU: no dysuria Integumentary: no rash  Allergy: no hives  Musculoskeletal: no leg swelling  Neurological: no slurred speech ROS otherwise negative  PAST MEDICAL HISTORY/PAST SURGICAL HISTORY:  Past Medical History  Diagnosis Date  . GERD (gastroesophageal reflux disease)   . 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 c/w steatohepatitis  . Folate deficiency 09/2013  . Anasarca 10/10/2013  . Hematemesis/vomiting blood 02/24/2014  . Acute blood loss anemia 02/25/2014    Status post transfusion  . Macrocytosis 02/28/2014  . 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 Acute renal failure 09/2013    Pre-renal- resolved  . C. difficile colitis 04/19/2014  . Fatty liver disease, nonalcoholic 0867  . Anxiety   . Depression   . Thrombocytopenia   . Dialysis patient     MEDICATIONS:  Prior to Admission  medications   Medication Sig Start Date End Date Taking? Authorizing Provider  folic acid (FOLVITE) 1 MG tablet Take 1 tablet (1 mg total) by mouth daily. 08/28/14   Radene Gunning, NP  lidocaine (XYLOCAINE) 2 % solution 2 TSP  PO QAC AND HS TO PREVENT FOR CHEST PAIN WHILE EATING Patient taking differently: Use as directed 10 mLs in the mouth or throat See admin instructions. 2 TSP  PO QAC AND HS TO PREVENT FOR CHEST PAIN WHILE EATING 07/26/14   Danie Binder, MD  loperamide (IMODIUM) 2 MG capsule Take 1 capsule (2 mg total) by mouth 2 (two) times daily as needed for diarrhea or loose stools. 08/28/14   Radene Gunning, NP  LORazepam (ATIVAN) 1 MG tablet Take 1 tablet (1 mg total) by mouth every 12 (twelve) hours as needed for anxiety. 08/30/14   Radene Gunning, NP  magnesium oxide (MAG-OX) 400 (241.3 MG) MG tablet Take 1 tablet (400 mg total) by mouth daily. Patient taking differently: Take 400 mg by mouth once a week.  04/27/14   Rexene Alberts, MD  metolazone (ZAROXOLYN) 5 MG tablet Take 10 mg by mouth daily.     Historical Provider, MD  midodrine (PROAMATINE) 10 MG tablet Take 1 tablet (10 mg total) by mouth 2 (two) times daily with a meal. Medication to keep your blood pressure from falling. 04/27/14   Rexene Alberts, MD  Nutritional Supplements (FEEDING SUPPLEMENT, NEPRO CARB STEADY,) LIQD Take 237 mLs by mouth as needed (missed meal during dialysis.). 08/28/14   Radene Gunning,  NP  Oxycodone HCl 10 MG TABS Take 1 tablet (10 mg total) by mouth every 6 (six) hours as needed. Patient taking differently: Take 10 mg by mouth every 6 (six) hours as needed (pain).  09/05/14   Danie Binder, MD  pantoprazole (PROTONIX) 40 MG tablet Take 1 tablet (40 mg total) by mouth daily. 07/07/14   Donne Hazel, MD  promethazine (PHENERGAN) 12.5 MG tablet Take 1 tablet (12.5 mg total) by mouth every 6 (six) hours as needed for nausea or vomiting. 09/05/14   Danie Binder, MD    ALLERGIES:  Allergies  Allergen Reactions   . Lasix [Furosemide]     "doesn't work"    SOCIAL HISTORY:  History  Substance Use Topics  . Smoking status: Former Smoker -- 0.25 packs/day for 20 years    Types: Cigarettes    Quit date: 11/05/2008  . Smokeless tobacco: Never Used  . Alcohol Use: No    FAMILY HISTORY: Family History  Problem Relation Age of Onset  . Heart disease Mother   . Colon cancer Neg Hx   . Liver disease Neg Hx     EXAM: BP 86/46 mmHg  Pulse 125  Temp(Src) 98.4 F (36.9 C)  Resp 20  Wt 118 lb (53.524 kg)  SpO2 100% CONSTITUTIONAL: Alert and oriented and responds appropriately to questions. Thin, chronically ill-appearing, slightly jaundiced HEAD: Normocephalic EYES: Conjunctivae clear, PERRL, conjunctival pallor  ENT: normal nose; no rhinorrhea; dry mucous membranes; pharynx without lesions noted NECK: Supple, no meningismus, no LAD  CARD: Regular and tachycardic; S1 and S2 appreciated; no murmurs, no clicks, no rubs, no gallops RESP: Normal chest excursion without splinting or tachypnea; breath sounds clear and equal bilaterally; no wheezes, no rhonchi, no rales, no hypoxia or respiratory distress, speaking full sentences ABD/GI: Normal bowel sounds; abdomen distended with mild fluid wave, soft, mildly tender to palpation in the right upper quadrant without guarding or rebound, no peritoneal signs RECTAL:  No gross blood in the patient does have melena, normal rectal tone, no hemorrhoids BACK:  The back appears normal and is non-tender to palpation, there is no CVA tenderness EXT: Normal ROM in all joints; non-tender to palpation; no edema; normal capillary refill; no cyanosis    SKIN: Normal color for age and race; warm NEURO: Moves all extremities equally, normal sensation diffusely, cranial nerves II through XII intact, slow to answer questions, no asterixis PSYCH: The patient's mood and manner are appropriate. Grooming and personal hygiene are appropriate.  MEDICAL DECISION MAKING: Pt here  with hematemesis, melena. She is hypertensive, tachycardic. We'll give IV fluids, obtain labs, type and screen, coags. We'll discuss with gastroenterology. She is nontender to palpation in her abdomen and had a paracentesis yesterday but doubt SBP. She does seem slow to answer questions but no asterixis or focal neurologic deficit. We'll check ammonia level. Patient will need admission.  ED PROGRESS: Patient just had an episode of vomiting right red blood approximately 200 ML's and has had an episode of melena in the ED. Blood pressure continues to be low despite IV fluids. We'll give 2 units of emergent PRBCs.     1:00 AM  Spoke with Dr. Laural Golden with gastroenterology who states he will see the patient when the patient is admitted to the ICU. Discussed with Dr. Shanon Brow for admission to the ICU. Patient's blood pressure and heart rate have improved with IV fluids. She is going to receive 2 units of packed red blood cells. Her platelet count  is 126. INR 1.47.   2:15 AM  Pt's blood pressure continues to be low in the 80s/40s but heart rate has improved. She appears to be mentating better. Blood is currently transfusing at 150 mL per hour. No respiratory distress or hypoxia. Her lungs are still clear to auscultation. She is going to be transferred to an ICU bed in the next several minutes.  CRITICAL CARE Performed by: Nyra Jabs   Total critical care time: 45 minutes  Critical care time was exclusive of separately billable procedures and treating other patients.  Critical care was necessary to treat or prevent imminent or life-threatening deterioration.  Critical care was time spent personally by me on the following activities: development of treatment plan with patient and/or surrogate as well as nursing, discussions with consultants, evaluation of patient's response to treatment, examination of patient, obtaining history from patient or surrogate, ordering and performing treatments and  interventions, ordering and review of laboratory studies, ordering and review of radiographic studies, pulse oximetry and re-evaluation of patient's condition.     Date: 09/16/2014 23:37  Rate: 122  Rhythm: Sinus tachycardia  QRS Axis: normal  Intervals: normal  ST/T Wave abnormalities: normal  Conduction Disutrbances: none  Narrative Interpretation: Sinus tachycardia, borderline prolonged QTC       Julena Barbour N Shreyan Hinz, DO 09/16/14 [place], DO 09/16/14 [place], DO 09/16/14 1610

## 2014-09-16 ENCOUNTER — Encounter (HOSPITAL_COMMUNITY)
Admission: EM | Disposition: A | Discharge status: Home / Self Care | Payer: Self-pay | Source: Home / Self Care | Attending: Internal Medicine

## 2014-09-16 ENCOUNTER — Encounter (HOSPITAL_COMMUNITY): Payer: Self-pay | Admitting: *Deleted

## 2014-09-16 ENCOUNTER — Encounter (HOSPITAL_COMMUNITY): Payer: Self-pay | Admitting: Anesthesiology

## 2014-09-16 ENCOUNTER — Inpatient Hospital Stay (HOSPITAL_COMMUNITY): Payer: Medicare Other | Admitting: Anesthesiology

## 2014-09-16 DIAGNOSIS — N186 End stage renal disease: Secondary | ICD-10-CM

## 2014-09-16 DIAGNOSIS — K3189 Other diseases of stomach and duodenum: Secondary | ICD-10-CM

## 2014-09-16 DIAGNOSIS — K219 Gastro-esophageal reflux disease without esophagitis: Secondary | ICD-10-CM | POA: Diagnosis present

## 2014-09-16 DIAGNOSIS — K7460 Unspecified cirrhosis of liver: Secondary | ICD-10-CM

## 2014-09-16 DIAGNOSIS — K7581 Nonalcoholic steatohepatitis (NASH): Secondary | ICD-10-CM | POA: Diagnosis present

## 2014-09-16 DIAGNOSIS — I8500 Esophageal varices without bleeding: Secondary | ICD-10-CM

## 2014-09-16 DIAGNOSIS — E876 Hypokalemia: Secondary | ICD-10-CM | POA: Diagnosis present

## 2014-09-16 DIAGNOSIS — E440 Moderate protein-calorie malnutrition: Secondary | ICD-10-CM | POA: Diagnosis present

## 2014-09-16 DIAGNOSIS — K920 Hematemesis: Secondary | ICD-10-CM

## 2014-09-16 DIAGNOSIS — I9589 Other hypotension: Secondary | ICD-10-CM | POA: Diagnosis present

## 2014-09-16 DIAGNOSIS — F319 Bipolar disorder, unspecified: Secondary | ICD-10-CM | POA: Diagnosis present

## 2014-09-16 DIAGNOSIS — K254 Chronic or unspecified gastric ulcer with hemorrhage: Secondary | ICD-10-CM | POA: Diagnosis present

## 2014-09-16 DIAGNOSIS — Z8249 Family history of ischemic heart disease and other diseases of the circulatory system: Secondary | ICD-10-CM | POA: Diagnosis not present

## 2014-09-16 DIAGNOSIS — D696 Thrombocytopenia, unspecified: Secondary | ICD-10-CM | POA: Diagnosis present

## 2014-09-16 DIAGNOSIS — R188 Other ascites: Secondary | ICD-10-CM

## 2014-09-16 DIAGNOSIS — Z992 Dependence on renal dialysis: Secondary | ICD-10-CM | POA: Diagnosis not present

## 2014-09-16 DIAGNOSIS — K449 Diaphragmatic hernia without obstruction or gangrene: Secondary | ICD-10-CM

## 2014-09-16 DIAGNOSIS — D62 Acute posthemorrhagic anemia: Secondary | ICD-10-CM

## 2014-09-16 DIAGNOSIS — Z6821 Body mass index (BMI) 21.0-21.9, adult: Secondary | ICD-10-CM | POA: Diagnosis not present

## 2014-09-16 DIAGNOSIS — K259 Gastric ulcer, unspecified as acute or chronic, without hemorrhage or perforation: Secondary | ICD-10-CM

## 2014-09-16 DIAGNOSIS — K921 Melena: Secondary | ICD-10-CM

## 2014-09-16 DIAGNOSIS — K766 Portal hypertension: Secondary | ICD-10-CM | POA: Diagnosis present

## 2014-09-16 DIAGNOSIS — Z87891 Personal history of nicotine dependence: Secondary | ICD-10-CM | POA: Diagnosis not present

## 2014-09-16 DIAGNOSIS — Z8719 Personal history of other diseases of the digestive system: Secondary | ICD-10-CM | POA: Insufficient documentation

## 2014-09-16 DIAGNOSIS — K922 Gastrointestinal hemorrhage, unspecified: Secondary | ICD-10-CM

## 2014-09-16 HISTORY — PX: ESOPHAGOGASTRODUODENOSCOPY: SHX5428

## 2014-09-16 LAB — COMPREHENSIVE METABOLIC PANEL
ALT: 14 U/L (ref 0–35)
ALT: 14 U/L (ref 0–35)
ANION GAP: 5 (ref 5–15)
AST: 36 U/L (ref 0–37)
AST: 37 U/L (ref 0–37)
Albumin: 2.5 g/dL — ABNORMAL LOW (ref 3.5–5.2)
Albumin: 2.7 g/dL — ABNORMAL LOW (ref 3.5–5.2)
Alkaline Phosphatase: 48 U/L (ref 39–117)
Alkaline Phosphatase: 57 U/L (ref 39–117)
Anion gap: 9 (ref 5–15)
BILIRUBIN TOTAL: 3.7 mg/dL — AB (ref 0.3–1.2)
BUN: 80 mg/dL — AB (ref 6–23)
BUN: 90 mg/dL — ABNORMAL HIGH (ref 6–23)
CALCIUM: 8.4 mg/dL (ref 8.4–10.5)
CHLORIDE: 102 mmol/L (ref 96–112)
CO2: 24 mmol/L (ref 19–32)
CO2: 26 mmol/L (ref 19–32)
CREATININE: 2.15 mg/dL — AB (ref 0.50–1.10)
Calcium: 8.4 mg/dL (ref 8.4–10.5)
Chloride: 106 mmol/L (ref 96–112)
Creatinine, Ser: 2.34 mg/dL — ABNORMAL HIGH (ref 0.50–1.10)
GFR calc Af Amer: 29 mL/min — ABNORMAL LOW (ref >90)
GFR calc Af Amer: 32 mL/min — ABNORMAL LOW (ref >90)
GFR calc non Af Amer: 25 mL/min — ABNORMAL LOW (ref >90)
GFR calc non Af Amer: 28 mL/min — ABNORMAL LOW (ref >90)
GLUCOSE: 120 mg/dL — AB (ref 70–99)
GLUCOSE: 122 mg/dL — AB (ref 70–99)
POTASSIUM: 3.9 mmol/L (ref 3.5–5.1)
Potassium: 3.3 mmol/L — ABNORMAL LOW (ref 3.5–5.1)
SODIUM: 135 mmol/L (ref 135–145)
SODIUM: 137 mmol/L (ref 135–145)
TOTAL PROTEIN: 4.9 g/dL — AB (ref 6.0–8.3)
Total Bilirubin: 2.6 mg/dL — ABNORMAL HIGH (ref 0.3–1.2)
Total Protein: 5.4 g/dL — ABNORMAL LOW (ref 6.0–8.3)

## 2014-09-16 LAB — CBC
HCT: 23 % — ABNORMAL LOW (ref 36.0–46.0)
HCT: 31.6 % — ABNORMAL LOW (ref 36.0–46.0)
HEMATOCRIT: 32.3 % — AB (ref 36.0–46.0)
HEMOGLOBIN: 7.8 g/dL — AB (ref 12.0–15.0)
Hemoglobin: 10.7 g/dL — ABNORMAL LOW (ref 12.0–15.0)
Hemoglobin: 11 g/dL — ABNORMAL LOW (ref 12.0–15.0)
MCH: 32 pg (ref 26.0–34.0)
MCH: 31 pg (ref 26.0–34.0)
MCH: 30.9 pg (ref 26.0–34.0)
MCHC: 33.9 g/dL (ref 30.0–36.0)
MCHC: 33.9 g/dL (ref 30.0–36.0)
MCHC: 34.1 g/dL (ref 30.0–36.0)
MCV: 94.3 fL (ref 78.0–100.0)
MCV: 91.6 fL (ref 78.0–100.0)
MCV: 90.7 fL (ref 78.0–100.0)
PLATELETS: 107 10*3/uL — AB (ref 150–400)
Platelets: 100 10*3/uL — ABNORMAL LOW (ref 150–400)
Platelets: 112 10*3/uL — ABNORMAL LOW (ref 150–400)
RBC: 2.44 MIL/uL — ABNORMAL LOW (ref 3.87–5.11)
RBC: 3.45 MIL/uL — ABNORMAL LOW (ref 3.87–5.11)
RBC: 3.56 MIL/uL — ABNORMAL LOW (ref 3.87–5.11)
RDW: 23 % — ABNORMAL HIGH (ref 11.5–15.5)
RDW: 21.2 % — ABNORMAL HIGH (ref 11.5–15.5)
RDW: 21.9 % — AB (ref 11.5–15.5)
WBC: 7.8 10*3/uL (ref 4.0–10.5)
WBC: 7.7 10*3/uL (ref 4.0–10.5)
WBC: 8.3 10*3/uL (ref 4.0–10.5)

## 2014-09-16 LAB — CBC WITH DIFFERENTIAL/PLATELET
BASOS ABS: 0.1 10*3/uL (ref 0.0–0.1)
Basophils Relative: 1 % (ref 0–1)
EOS ABS: 0.1 10*3/uL (ref 0.0–0.7)
EOS PCT: 1 % (ref 0–5)
HCT: 19.7 % — ABNORMAL LOW (ref 36.0–46.0)
Hemoglobin: 6.5 g/dL — CL (ref 12.0–15.0)
LYMPHS PCT: 26 % (ref 12–46)
Lymphs Abs: 2.2 10*3/uL (ref 0.7–4.0)
MCH: 34.8 pg — ABNORMAL HIGH (ref 26.0–34.0)
MCHC: 33 g/dL (ref 30.0–36.0)
MCV: 105.3 fL — AB (ref 78.0–100.0)
MONOS PCT: 9 % (ref 3–12)
Monocytes Absolute: 0.8 10*3/uL (ref 0.1–1.0)
NEUTROS PCT: 63 % (ref 43–77)
Neutro Abs: 5.2 10*3/uL (ref 1.7–7.7)
Platelets: 126 10*3/uL — ABNORMAL LOW (ref 150–400)
RBC: 1.87 MIL/uL — ABNORMAL LOW (ref 3.87–5.11)
RDW: 18.6 % — AB (ref 11.5–15.5)
WBC: 8.4 10*3/uL (ref 4.0–10.5)

## 2014-09-16 LAB — PREPARE RBC (CROSSMATCH)

## 2014-09-16 LAB — PROTIME-INR
INR: 1.47 (ref 0.00–1.49)
PROTHROMBIN TIME: 18 s — AB (ref 11.6–15.2)

## 2014-09-16 LAB — MRSA PCR SCREENING: MRSA by PCR: NEGATIVE

## 2014-09-16 LAB — APTT: APTT: 36 s (ref 24–37)

## 2014-09-16 LAB — AMMONIA: Ammonia: 53 umol/L — ABNORMAL HIGH (ref 11–32)

## 2014-09-16 SURGERY — EGD (ESOPHAGOGASTRODUODENOSCOPY)
Anesthesia: General

## 2014-09-16 SURGERY — EGD (ESOPHAGOGASTRODUODENOSCOPY)
Anesthesia: Moderate Sedation

## 2014-09-16 SURGERY — CANCELLED PROCEDURE

## 2014-09-16 MED ORDER — CETYLPYRIDINIUM CHLORIDE 0.05 % MT LIQD
7.0000 mL | Freq: Two times a day (BID) | OROMUCOSAL | Status: DC
  Administered 2014-09-16 – 2014-09-19 (×8): 7 mL via OROMUCOSAL

## 2014-09-16 MED ORDER — PROPOFOL 10 MG/ML IV EMUL
0.0000 ug/kg/min | INTRAVENOUS | Status: DC
  Administered 2014-09-16: 5 ug/kg/min via INTRAVENOUS

## 2014-09-16 MED ORDER — SODIUM CHLORIDE 0.9 % IJ SOLN
3.0000 mL | Freq: Two times a day (BID) | INTRAMUSCULAR | Status: DC
  Administered 2014-09-16 – 2014-09-19 (×7): 3 mL via INTRAVENOUS

## 2014-09-16 MED ORDER — SODIUM CHLORIDE 0.9 % IJ SOLN
INTRAMUSCULAR | Status: AC
  Filled 2014-09-16: qty 50

## 2014-09-16 MED ORDER — PANTOPRAZOLE SODIUM 40 MG IV SOLR
INTRAVENOUS | Status: AC
Start: 2014-09-16 — End: 2014-09-16
  Filled 2014-09-16: qty 80

## 2014-09-16 MED ORDER — SODIUM CHLORIDE 0.9 % IV SOLN: 10.0000 mL/h | Freq: Once | INTRAVENOUS | Status: DC

## 2014-09-16 MED ORDER — LIDOCAINE HCL (PF) 1 % IJ SOLN
INTRAMUSCULAR | Status: AC
  Filled 2014-09-16: qty 5

## 2014-09-16 MED ORDER — ONDANSETRON HCL 4 MG/2ML IJ SOLN
INTRAMUSCULAR | Status: AC
  Filled 2014-09-16: qty 2

## 2014-09-16 MED ORDER — PROPOFOL 10 MG/ML IV BOLUS
INTRAVENOUS | Status: AC
  Filled 2014-09-16: qty 20

## 2014-09-16 MED ORDER — PHENYLEPHRINE HCL 10 MG/ML IJ SOLN
INTRAMUSCULAR | Status: AC
  Filled 2014-09-16: qty 1

## 2014-09-16 MED ORDER — SODIUM CHLORIDE 0.9 % IV SOLN
INTRAVENOUS | Status: DC
  Administered 2014-09-16: 18:00:00 via INTRAVENOUS

## 2014-09-16 MED ORDER — ONDANSETRON HCL 4 MG/2ML IJ SOLN
4.0000 mg | Freq: Four times a day (QID) | INTRAMUSCULAR | Status: DC | PRN
  Administered 2014-09-16 – 2014-09-19 (×3): 4 mg via INTRAVENOUS
  Filled 2014-09-16 (×3): qty 2

## 2014-09-16 MED ORDER — ONDANSETRON HCL 4 MG PO TABS
4.0000 mg | ORAL_TABLET | Freq: Four times a day (QID) | ORAL | Status: DC | PRN
  Administered 2014-09-17: 4 mg via ORAL
  Filled 2014-09-16: qty 1

## 2014-09-16 MED ORDER — SODIUM CHLORIDE 0.9 % IV BOLUS (SEPSIS)
500.0000 mL | Freq: Once | INTRAVENOUS | Status: AC
  Administered 2014-09-16: 500 mL via INTRAVENOUS

## 2014-09-16 MED ORDER — SODIUM CHLORIDE 0.9 % IV SOLN
250.0000 mL | INTRAVENOUS | Status: DC | PRN
  Administered 2014-09-16: 250 mL via INTRAVENOUS

## 2014-09-16 MED ORDER — CEFTRIAXONE SODIUM IN DEXTROSE 20 MG/ML IV SOLN
1.0000 g | Freq: Once | INTRAVENOUS | Status: AC
  Administered 2014-09-16: 1 g via INTRAVENOUS
  Filled 2014-09-16: qty 50

## 2014-09-16 MED ORDER — ONDANSETRON HCL 4 MG/2ML IJ SOLN
4.0000 mg | Freq: Once | INTRAMUSCULAR | Status: AC
  Administered 2014-09-16: 4 mg via INTRAVENOUS

## 2014-09-16 MED ORDER — SODIUM CHLORIDE 0.9 % IJ SOLN: 3.0000 mL | INTRAMUSCULAR | Status: DC | PRN

## 2014-09-16 MED ORDER — EPINEPHRINE HCL 0.1 MG/ML IJ SOSY
PREFILLED_SYRINGE | INTRAMUSCULAR | Status: AC
  Filled 2014-09-16: qty 10

## 2014-09-16 MED ORDER — MIDAZOLAM HCL 2 MG/2ML IJ SOLN
INTRAMUSCULAR | Status: AC
  Filled 2014-09-16: qty 2

## 2014-09-16 MED ORDER — PROPOFOL 10 MG/ML IV EMUL
INTRAVENOUS | Status: AC
  Filled 2014-09-16: qty 100

## 2014-09-16 MED ORDER — PHENOL 1.4 % MT LIQD
1.0000 | OROMUCOSAL | Status: DC | PRN
  Filled 2014-09-16: qty 177

## 2014-09-16 MED ORDER — SUCRALFATE 1 GM/10ML PO SUSP
1.0000 g | Freq: Three times a day (TID) | ORAL | Status: DC
  Administered 2014-09-16 – 2014-09-18 (×10): 1 g via ORAL
  Filled 2014-09-16 (×13): qty 10

## 2014-09-16 MED ORDER — MORPHINE SULFATE 4 MG/ML IJ SOLN
3.0000 mg | INTRAMUSCULAR | Status: DC | PRN
  Administered 2014-09-16: 3 mg via INTRAVENOUS
  Filled 2014-09-16: qty 1

## 2014-09-16 MED ORDER — FENTANYL CITRATE 0.05 MG/ML IJ SOLN
INTRAMUSCULAR | Status: AC
  Filled 2014-09-16: qty 2

## 2014-09-16 MED ORDER — DIPHENHYDRAMINE HCL 50 MG/ML IJ SOLN
INTRAMUSCULAR | Status: AC
  Filled 2014-09-16: qty 1

## 2014-09-16 MED ORDER — DIPHENHYDRAMINE HCL 50 MG/ML IJ SOLN
12.5000 mg | Freq: Once | INTRAMUSCULAR | Status: AC
  Administered 2014-09-16: 12.5 mg via INTRAVENOUS

## 2014-09-16 MED ORDER — METOCLOPRAMIDE HCL 5 MG/ML IJ SOLN
10.0000 mg | Freq: Once | INTRAMUSCULAR | Status: AC
  Administered 2014-09-16: 10 mg via INTRAVENOUS
  Filled 2014-09-16: qty 2

## 2014-09-16 MED ORDER — OCTREOTIDE ACETATE 500 MCG/ML IJ SOLN
INTRAMUSCULAR | Status: AC
  Filled 2014-09-16: qty 1

## 2014-09-16 MED ORDER — SUCCINYLCHOLINE CHLORIDE 20 MG/ML IJ SOLN
INTRAMUSCULAR | Status: AC
Start: 2014-09-16 — End: 2014-09-16
  Filled 2014-09-16: qty 1

## 2014-09-16 MED ORDER — EPHEDRINE SULFATE 50 MG/ML IJ SOLN
INTRAMUSCULAR | Status: AC
  Filled 2014-09-16: qty 1

## 2014-09-16 MED ORDER — ONDANSETRON HCL 4 MG/2ML IJ SOLN
4.0000 mg | Freq: Once | INTRAMUSCULAR | Status: AC
  Administered 2014-09-16: 4 mg via INTRAVENOUS
  Filled 2014-09-16: qty 2

## 2014-09-16 MED ORDER — ROCURONIUM BROMIDE 50 MG/5ML IV SOLN
INTRAVENOUS | Status: AC
  Filled 2014-09-16: qty 1

## 2014-09-16 MED ORDER — SUCCINYLCHOLINE CHLORIDE 20 MG/ML IJ SOLN
INTRAMUSCULAR | Status: DC | PRN
  Administered 2014-09-16: 100 mg via INTRAVENOUS

## 2014-09-16 MED ORDER — SODIUM CHLORIDE 0.9 % IJ SOLN
INTRAMUSCULAR | Status: AC
  Filled 2014-09-16: qty 20

## 2014-09-16 MED ORDER — METOCLOPRAMIDE HCL 5 MG/ML IJ SOLN
5.0000 mg | Freq: Four times a day (QID) | INTRAMUSCULAR | Status: AC
  Administered 2014-09-16 – 2014-09-17 (×4): 5 mg via INTRAVENOUS
  Filled 2014-09-16 (×4): qty 2

## 2014-09-16 MED ORDER — NOREPINEPHRINE BITARTRATE 1 MG/ML IV SOLN
0.0000 ug/min | INTRAVENOUS | Status: DC
  Filled 2014-09-16: qty 4

## 2014-09-16 MED ORDER — OXYCODONE HCL 5 MG PO TABS
5.0000 mg | ORAL_TABLET | ORAL | Status: DC | PRN
  Administered 2014-09-16 – 2014-09-19 (×15): 5 mg via ORAL
  Filled 2014-09-16 (×15): qty 1

## 2014-09-16 MED ORDER — SODIUM CHLORIDE 0.9 % IV SOLN
10.0000 mL/h | Freq: Once | INTRAVENOUS | Status: AC
  Administered 2014-09-16: 10 mL/h via INTRAVENOUS

## 2014-09-16 MED ORDER — ETOMIDATE 2 MG/ML IV SOLN
INTRAVENOUS | Status: DC | PRN
  Administered 2014-09-16: 12 mg via INTRAVENOUS

## 2014-09-16 MED ORDER — STERILE WATER FOR IRRIGATION IR SOLN
Status: DC | PRN
  Administered 2014-09-16: 10:00:00

## 2014-09-16 SURGICAL SUPPLY — 24 items
BLOCK BITE 60FR ADLT L/F BLUE (MISCELLANEOUS) ×4 IMPLANT
ELECT REM PT RETURN 9FT ADLT (ELECTROSURGICAL)
ELECTRODE REM PT RTRN 9FT ADLT (ELECTROSURGICAL) IMPLANT
FLOOR PAD 36X40 (MISCELLANEOUS)
FORCEP COLD BIOPSY (CUTTING FORCEPS) IMPLANT
FORCEP RJ3 GP 1.8X160 W-NEEDLE (CUTTING FORCEPS) IMPLANT
FORCEPS BIOP RAD 4 LRG CAP 4 (CUTTING FORCEPS) IMPLANT
FORMALIN 10 PREFIL 20ML (MISCELLANEOUS) IMPLANT
KIT CLEAN ENDO COMPLIANCE (KITS) IMPLANT
MANIFOLD NEPTUNE II (INSTRUMENTS) ×4 IMPLANT
MANIFOLD NEPTUNE WASTE (CANNULA) IMPLANT
NDL SCLEROTHERAPY 25GX240 (NEEDLE) IMPLANT
NEEDLE SCLEROTHERAPY 25GX240 (NEEDLE) IMPLANT
PAD FLOOR 36X40 (MISCELLANEOUS) IMPLANT
PROBE APC STR FIRE (PROBE) IMPLANT
PROBE INJECTION GOLD (MISCELLANEOUS)
PROBE INJECTION GOLD 7FR (MISCELLANEOUS) IMPLANT
SNARE ROTATE MED OVAL 20MM (MISCELLANEOUS) IMPLANT
SNARE SHORT THROW 13M SML OVAL (MISCELLANEOUS) ×4 IMPLANT
SYR 50ML LL SCALE MARK (SYRINGE) IMPLANT
SYR INFLATION 60ML (SYRINGE) IMPLANT
TUBING INSUFFLATOR CO2MPACT (TUBING) ×4 IMPLANT
TUBING IRRIGATION ENDOGATOR (MISCELLANEOUS) ×4 IMPLANT
WATER STERILE IRR 1000ML POUR (IV SOLUTION) IMPLANT

## 2014-09-16 NOTE — Anesthesia Procedure Notes (Signed)
Procedure Name: Intubation Date/Time: 09/16/2014 10:50 AM Performed by: Andree Elk, AMY A Pre-anesthesia Checklist: Patient identified, Patient being monitored, Timeout performed, Emergency Drugs available and Suction available Patient Re-evaluated:Patient Re-evaluated prior to inductionOxygen Delivery Method: Circle System Utilized Preoxygenation: Pre-oxygenation with 100% oxygen Intubation Type: IV induction, Rapid sequence and Cricoid Pressure applied Laryngoscope Size: 3 and Miller Grade View: Grade I Tube type: Subglottic suction tube Tube size: 7.0 mm Number of attempts: 1 Airway Equipment and Method: Stylet Placement Confirmation: ETT inserted through vocal cords under direct vision,  breath sounds checked- equal and bilateral and CO2 detector Secured at: 21 cm Tube secured with: Tape Dental Injury: Teeth and Oropharynx as per pre-operative assessment

## 2014-09-16 NOTE — Progress Notes (Signed)
Pt refusing to sign consent to allow [place] to request information from [facility] concerning liver transplant list. Pt educated on importance of MD receiving information to continue to care for pt. Pt is aware that she will be intubated and may be unable to sign consent in the next few days due to sedation. Despite all this, she still does not feel comfortable giving hospital consent to receive information from [facility]. Will continue to monitor.

## 2014-09-16 NOTE — Anesthesia Preprocedure Evaluation (Signed)
Anesthesia Evaluation  Patient identified by MRN, date of birth, ID band Patient awake    Reviewed: Allergy & Precautions, H&P , NPO status , Patient's Chart, lab work & pertinent test results, reviewed documented beta blocker date and time   Airway Mallampati: II  TM Distance: >3 FB Neck ROM: full    Dental no notable dental hx. (+) Teeth Intact   Pulmonary resolved, former smoker,  breath sounds clear to auscultation        Cardiovascular Rhythm:regular Rate:Normal     Neuro/Psych PSYCHIATRIC DISORDERS Bipolar disorder; depression   GI/Hepatic GERD-  ,(+) Cirrhosis -      ,   Endo/Other    Renal/GU CRFRenal diseaseLast HD was on 09/15/2013     Musculoskeletal   Abdominal   Peds  Hematology  (+) anemia , Acute blood loss anemia; Last H/H 7.8/23 after 2u prbcs; Receiving 3rd unit now   Anesthesia Other Findings Plan for RSI at bedside; will leave patient intubated.  Reproductive/Obstetrics negative OB ROS                             Anesthesia Physical Anesthesia Plan  ASA: IV and emergent  Anesthesia Plan: General ETT, Rapid Sequence and Cricoid Pressure   Post-op Pain Management:    Induction:   Airway Management Planned:   Additional Equipment:   Intra-op Plan:   Post-operative Plan:   Informed Consent: I have reviewed the patients History and Physical, chart, labs and discussed the procedure including the risks, benefits and alternatives for the proposed anesthesia with the patient or authorized representative who has indicated his/her understanding and acceptance.   Dental Advisory Given  Plan Discussed with: Anesthesiologist and Surgeon  Anesthesia Plan Comments:         Anesthesia Quick Evaluation

## 2014-09-16 NOTE — ED Notes (Signed)
Patient vomited ~ 100 ml dark red blood; patient also had ~ 50 ml dark red bloody stool. MD notified.

## 2014-09-16 NOTE — Op Note (Signed)
EGD PROCEDURE REPORT  PATIENT:  Wanda [name]  MR#:  [id number] Birthdate:  03/26/1976, 39 y.o., female Endoscopist:  Dr. Rogene Houston, MD Referred By:  Dr. Steward Ros, MD Procedure Date: 09/16/2014  Procedure:   EGD  Indications:  Patient is 39 year old Caucasian female with decompensated cirrhosis and history of esophageal variceal bleed presents with hematemesis and melena. Hemoglobin on admission was 6.5 g and she has received 4 units of PRBCs. Patient's last EGD with [facility] was on 07/24/2014.          Informed Consent:  The risks, benefits, alternatives & imponderables which include, but are not limited to, bleeding, infection, perforation, drug reaction and potential missed lesion have been reviewed.  The potential for biopsy, lesion removal, esophageal dilation, etc. have also been discussed.  Questions have been answered.  All parties agreeable.  Please see history & physical in medical record for more information.  Medications:  Patient was intubated by anesthesia team for airway protection and maintain on propofol infusion for sedation. Procedure was performed at bedside in ICU.  Description of procedure:  The endoscope was introduced through the mouth and advanced to the second portion of the duodenum without difficulty or limitations. The mucosal surfaces were surveyed very carefully during advancement of the scope and upon withdrawal.  Findings:  Esophagus: Mucosa of proximal and middle segment was normal. Single short column of varix noted distally at 12 o'clock. 2 small ulcers noted at GE junction one was covered with coffee-ground speck. This ulcer was gently washed with water and no bleeding induced. GEJ:  36 cm Hiatus:  38 cm Stomach:  Stomach was empty and distended very well with insufflation. Folds in the proximal stomach were normal. Examination mucosa revealed more or less diffuse mosaic pattern or snake skinning and patchy erythema. No erosions or ulcers noted.  Pyloric channel was patent. Mucosa at angularis revealed similar changes. No fundal varices identified. Duodenum:  Normal bulbar and post bulbar mucosa.  Therapeutic/Diagnostic Maneuvers Performed:  None  Complications:  None  Impression: Single short column of varix proximal to GE junction not large enough to be banded. Two small ulcers at GEJ felt to be source of GI bleeding but no active bleeding noted. No therapy rendered. Portal gastropathy. No evidence of peptic ulcer disease or gastric varices.  Recommendations:  Continue IV pantoprazole infusion. Continue octreotide infusion for 48 hours. Patient will be extubated later today when she is not sedated. Clear liquids. Metoclopramide 5 mg IV every 64 doses. Sucralfate 1 g by mouth before meals and daily at bedtime. Dr. Gala Romney will be seen patient starting tomorrow morning as Dr. Oneida Alar is out of town.   REHMAN,NAJEEB U  09/16/2014  11:42 AM  CC: Dr. Alroy Dust [facility] & Dr. No ref. provider found

## 2014-09-16 NOTE — ED Notes (Signed)
CRITICAL VALUE ALERT  Critical value received:  Hemoglobin 6.5  Date of notification:  09/16/14  Time of notification:  0050  Critical value read back:Yes.    Nurse who received alert:  Derek Mound, RN  MD notified (1st page):  ward  Time of first page:  0052  MD notified (2nd page):  Time of second page:  Responding MD:  ward  Time MD responded:  [phone]

## 2014-09-16 NOTE — H&P (Addendum)
PCP:   [place]   Chief Complaint:  Vomiting blood  HPI: 38 yo female dialysis pt with NASH cirrhosis, esophageal varices comes in with several episodes of coffee ground emesis today and melana.  She has had esophaageal banding x2 in the past.  She was evaluated [facility] in the last month for possible liver transplant, but she does not know what their decision is, and has no follow up with [facility] scheduled.  She has never heard of the word TIPS.  She denies any abd pain.  She has not vomited in over 3 hours.  She is not nauseated right now.  No fevers.  Her last dialysis was Saturday.  She has not had any melena in several hours either, had no brbpr.    Review of Systems:  Positive and negative as per HPI otherwise all other systems are negative  Past Medical History: Past Medical History  Diagnosis Date  . GERD (gastroesophageal reflux disease)   . 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 c/w steatohepatitis  . Folate deficiency 09/2013  . Anasarca 10/10/2013  . Hematemesis/vomiting blood 02/24/2014  . Acute blood loss anemia 02/25/2014    Status post transfusion  . Macrocytosis 02/28/2014  . 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 Acute renal failure 09/2013    Pre-renal- resolved  . C. difficile colitis 04/19/2014  . Fatty liver disease, nonalcoholic 4315  . Anxiety   . Depression   . Thrombocytopenia   . Dialysis patient    Past Surgical History  Procedure Laterality Date  . None    . Paracentesis  Feb 2015    1180 fluid, negative fluid analysis.   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 Paracentesis  10/2013  . Colonoscopy N/A 12/19/2013    SLF:NO OBVIOUS SOURCE FOR ANEMIA IDETIFIED/ONE COLON POLYP REMOVED/Small internal hemorrhoids  . Esophagogastroduodenoscopy N/A  02/11/2014    Dr. Rourk:Esophageal varices with bleeding stigmata-status post esophageal band ligation therapy. Portal gastropathy  . Esophagogastroduodenoscopy N/A 07/04/2014    RMR: Persiting grade 2 esophageal varicies with bleeding stigmata status post band ligation. Significantly congested gastric mucosa iwith changes constistant with protal gastropathy.   . Esophageal banding  07/04/2014    Procedure: ESOPHAGEAL BANDING;  Surgeon: Daneil Dolin, MD;  Location: [facility];  Service: Endoscopy;;  . Esophagogastroduodenoscopy (egd) with propofol N/A 07/24/2014    SLF:  1. 2 columns grade 2-3 varices- 2 Bands applied.  2.  Moderate gastropathy 3. Duodenal Diverticula  . Esophageal banding N/A 07/24/2014    Procedure: ESOPHAGEAL BANDING (2 bands applied);  Surgeon: Danie Binder, MD;  Location: [facility] ORS;  Service: Endoscopy;  Laterality: N/A;  . Central venous catheter insertion Right     Medications: Prior to Admission medications   Medication Sig Start Date End Date Taking? Authorizing Provider  folic acid (FOLVITE) 1 MG tablet Take 1 tablet (1 mg total) by mouth daily. 08/28/14   Radene Gunning, NP  lidocaine (XYLOCAINE) 2 % solution 2 TSP  PO QAC AND HS TO PREVENT FOR CHEST PAIN WHILE EATING Patient taking differently: Use as directed 10 mLs in the mouth or throat See admin instructions. 2 TSP  PO QAC AND HS TO PREVENT FOR CHEST PAIN WHILE EATING 07/26/14   Danie Binder, MD  loperamide (IMODIUM) 2  MG capsule Take 1 capsule (2 mg total) by mouth 2 (two) times daily as needed for diarrhea or loose stools. 08/28/14   Radene Gunning, NP  LORazepam (ATIVAN) 1 MG tablet Take 1 tablet (1 mg total) by mouth every 12 (twelve) hours as needed for anxiety. 08/30/14   Radene Gunning, NP  magnesium oxide (MAG-OX) 400 (241.3 MG) MG tablet Take 1 tablet (400 mg total) by mouth daily. Patient taking differently: Take 400 mg by mouth once a week.  04/27/14   Rexene Alberts, MD  metolazone (ZAROXOLYN) 5 MG  tablet Take 10 mg by mouth daily.     Historical Provider, MD  midodrine (PROAMATINE) 10 MG tablet Take 1 tablet (10 mg total) by mouth 2 (two) times daily with a meal. Medication to keep your blood pressure from falling. 04/27/14   Rexene Alberts, MD  Nutritional Supplements (FEEDING SUPPLEMENT, NEPRO CARB STEADY,) LIQD Take 237 mLs by mouth as needed (missed meal during dialysis.). 08/28/14   Radene Gunning, NP  Oxycodone HCl 10 MG TABS Take 1 tablet (10 mg total) by mouth every 6 (six) hours as needed. Patient taking differently: Take 10 mg by mouth every 6 (six) hours as needed (pain).  09/05/14   Danie Binder, MD  pantoprazole (PROTONIX) 40 MG tablet Take 1 tablet (40 mg total) by mouth daily. 07/07/14   Donne Hazel, MD  promethazine (PHENERGAN) 12.5 MG tablet Take 1 tablet (12.5 mg total) by mouth every 6 (six) hours as needed for nausea or vomiting. 09/05/14   Danie Binder, MD    Allergies:   Allergies  Allergen Reactions  . Lasix [Furosemide]     "doesn't work"    Social History:  reports that she quit smoking about 5 years ago. Her smoking use included Cigarettes. She has a 5 pack-year smoking history. She has never used smokeless tobacco. She reports that she does not drink alcohol or use illicit drugs.  Family History: Family History  Problem Relation Age of Onset  . Heart disease Mother   . Colon cancer Neg Hx   . Liver disease Neg Hx     Physical Exam: Filed Vitals:   09/16/14 0045 09/16/14 0101 09/16/14 0101 09/16/14 0115  BP: 92/51 95/45 95/45  84/33  Pulse: 110 102 102 102  Temp:  97.9 F (36.6 C) 97.9 F (36.6 C)   TempSrc:  Oral Oral   Resp: 21 17 17 20   Weight:      SpO2: 100% 100% 100% 100%   General appearance: alert, cooperative and no distress Head: Normocephalic, without obvious abnormality, atraumatic Eyes: negative Nose: Nares normal. Septum midline. Mucosa normal. No drainage or sinus tenderness. Neck: no JVD and supple, symmetrical, trachea  midline Lungs: clear to auscultation bilaterally Heart: regular rate and rhythm, S1, S2 normal, no murmur, click, rub or gallop Abdomen: soft, non-tender; bowel sounds normal; no masses,  no organomegaly Extremities: extremities normal, atraumatic, no cyanosis or edema Pulses: 2+ and symmetric Skin: Skin color, texture, turgor normal. No rashes or lesions Neurologic: Grossly normal  Labs on Admission:   Recent Labs  09/16/14 0020  NA 137  K 3.3*  CL 102  CO2 26  GLUCOSE 122*  BUN 80*  CREATININE 2.15*  CALCIUM 8.4    Recent Labs  09/16/14 0020  AST 37  ALT 14  ALKPHOS 57  BILITOT 2.6*  PROT 5.4*  ALBUMIN 2.7*    Recent Labs  09/16/14 0020  WBC 8.4  NEUTROABS 5.2  HGB 6.5*  HCT 19.7*  MCV 105.3*  PLT 126*   Radiological Exams on Admission: US Paracentesis  09/14/2014   CLINICAL DATA:  Ascites  EXAM: ULTRASOUND GUIDED  PARACENTESIS  COMPARISON:  None.  PROCEDURE: An ultrasound guided paracentesis was thoroughly discussed with the patient and questions answered. The benefits, risks, alternatives and complications were also discussed. The patient understands and wishes to proceed with the procedure. Written consent was obtained.  Ultrasound was performed to localize and mark an adequate pocket of fluid in the right lower quadrant of the abdomen. The area was then prepped and draped in the normal sterile fashion. 1% Lidocaine was used for local anesthesia. Under ultrasound guidance a 19 gauge Yueh catheter was introduced. Paracentesis was performed. The catheter was removed and a dressing applied.  Complications: None.  FINDINGS: A total of approximately 4.7 L of clear yellow fluid was removed. A fluid sample was not sent for laboratory analysis.  IMPRESSION: Successful ultrasound guided paracentesis yielding 4.7 L of ascites.   Electronically Signed   By: Rolm Baptise M.D.   On: 09/14/2014 13:08   Assessment/Plan  39 yo female with NASH cirrhosis with acute blood loss  anemia  Principal Problem:   Hematemesis/vomiting blood with melena - likely from variceal bleed.  Cont protonix and octreotide gtt.  Pt has received one liter of ivf and her bp has improved and stabilized.  She is getting her first unit of prbcs right now and her bp is still normalized.  hgb is 6.5.  Will transfuse 2 units prbc and ffp at this time.  Will draw h/h after 2 units, but if her MAP is below 65 will continue to transfuse, no other ivf due to her dialysis status unless becomes more hypotensive.  Will get another 2 units of prbcs ready for later in day.  Gi dr Laural Golden has been called and will see in am for EGD, will call him around 6 am with update or sooner if pt starts to bleed again.  But appears she is stabalizing at this time.  Active Problems:     Liver cirrhosis secondary to nonalcoholic steatohepatitis (NASH)-  Unclear what [facility] plan is for transplant.  Will request records from her appt.   Esophageal varices-  Octreotide gtt as above   Portal hypertensive gastropathy   Dialysis patient-  Will need to get nephro involved after we deal with her acute GI issues.   Anemia associated with acute blood loss-  As above  Admit to icu.  Full code.  Nahlia Hellmann A 09/16/2014, 1:24 AM Cc time 40 min

## 2014-09-16 NOTE — Progress Notes (Signed)
eLink Physician-Brief Progress Note Patient Name: Wanda [name] DOB: [birth year] MRN: [id number]   Date of Service  09/16/2014  HPI/Events of Note  58 F with NASH, varices banded 12/15, ESRD on HD and chronic thrombocytopenia admitted with ICU with UGIB.  Hgb of 6.5.  She is receiving blood products and on sandostatin and protonix gtts.  She is currently HD stable and in no resp distress  eICU Interventions  Plan per primary team and GI Continue to monitor via elink     Intervention Category Evaluation Type: New Patient Evaluation  DETERDING,ELIZABETH 09/16/2014, 3:24 AM

## 2014-09-16 NOTE — Progress Notes (Signed)
The patient is a 39 year old woman with a history significant for severe NASH cirrhosis; chronic abdominal ascites; chronic hypotension; thrombocytopenia; and progressive chronic kidney disease with recent start of hemodialysis. She was admitted this morning by Dr. Shanon Brow for upper GI bleeding, likely from esophageal varices and anemia. She was briefly seen. Her chart, vital signs, laboratory studies were reviewed. Agree with management as started.  She is status post 2 units of packed red blood cell transfusion with improvement in her hemoglobin to 7.8. Dr. Laural Golden has seen the patient and plans an EGD with intervention today.  -We'll check the patient's CBC every 6 hours. -GI added ceftriaxone IV daily. -We'll consult nephrology for treatment of progressive chronic kidney disease. -I offered to discuss the patient's medical history and current medical status with her family, namely her son, but she refused. She said "I'll tell him when I'm ready".

## 2014-09-16 NOTE — Progress Notes (Addendum)
Endo team at beside at 1020. Navin Dogan,RN left bedside at [facility]. Gave report to Adeola Heath,RN. VSS.

## 2014-09-16 NOTE — Consult Note (Signed)
Referring Provider: Bettye Boeck.Wanda Brow, MD Primary Care Physician:  [name] Primary Gastroenterologist:  Dr. Laural Golden  Reason for Consultation:    Upper GI bleed in a patient with history of cirrhosis and esophageal variceal bleed.  HPI:   Patient is 39 year old Caucasian female who has history of cirrhosis presumed to be due to NAFLD based on liver biopsy of April 2015 who presents with melena and anemia. She has history of esophageal variceal bleeds. She has undergone banding in July, November and most recently on 07/24/2014. She was scheduled to undergo elective banding by Dr. Oneida Alar in 2 days. However last night she became sick and began vomiting bright red blood per rectum. She also had tarry stool. She felt very weak. She noted left-sided abdominal pain. Patient was brought to emergency room around midnight. She was noted to be tachycardic and hypotensive and resuscitated with IV normal saline. Her hemoglobin was 6.5 g and platelet count 126K. Hemoglobin was 10.3 four  days earlier. Patient was begun on octreotide infusion and pantoprazole. She was admitted to ICU. She has received 3 units of PRBCs. She just has been stuck for H&H. She complains of nausea but she hasn't vomited the last 2-4 hours. She did have one bowel movement in ICU and was tarry. She denies shortness of breath or chest pain. She has intermittent heartburn. She denies dysphagia. She also complains of pain in left lower quadrant of her abdomen. He also reports intermittent pain at umbilicus hernia. She does not take OTC NSAIDs. Patient was recently evaluated at [facility] she offers no details. She says she had abdominal paracenteses 2 days ago for symptomatic relief. Prior tap was about 2 weeks earlier. Patient used to drink alcohol socially but she hasn't had any in number of years. She lives with her fianc [name] who is at work.    Past Medical History  Diagnosis Date  . GERD (gastroesophageal  reflux disease)   . 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 c/w steatohepatitis  . Folate deficiency 09/2013  . Anasarca 10/10/2013  . Hematemesis/vomiting blood 02/24/2014  . Acute blood loss anemia 02/25/2014    Status post transfusion  . Macrocytosis 02/28/2014  . 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 Acute renal failure 09/2013    Pre-renal- resolved  . C. difficile colitis 04/19/2014  . Anxiety   . Depression   . Thrombocytopenia     Hypercellular bone marrow; abundant megakaryocytes per 08/27/2014; s/p bone marrow bx  . Dialysis patient   . Cirrhosis of liver with ascites   . SBP (spontaneous bacterial peritonitis) 11/10/2013  . Bipolar disorder 12/04/2013    2007-SEEN IN ED FOR INVOLUNTARY COMMITMENT, UDS POS FOR AMPHETAMINES/OPIATES   . PNA (pneumonia) 10/13/2013  . Chronic hypotension     Past Surgical History  Procedure Laterality Date  . None    . Paracentesis  Feb 2015    1180 fluid, negative fluid analysis.   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 Paracentesis  10/2013  . Colonoscopy N/A 12/19/2013    SLF:NO OBVIOUS SOURCE FOR ANEMIA IDETIFIED/ONE COLON POLYP REMOVED/Small internal hemorrhoids  . Esophagogastroduodenoscopy N/A 02/11/2014    Dr. Rourk:Esophageal varices with bleeding stigmata-status post esophageal band ligation therapy. Portal gastropathy  . Esophagogastroduodenoscopy N/A 07/04/2014    RMR: Persiting grade 2 esophageal varicies  with bleeding stigmata status post band ligation. Significantly congested gastric mucosa iwith changes constistant with protal gastropathy.   . Esophageal banding  07/04/2014    Procedure: ESOPHAGEAL BANDING;  Surgeon: Daneil Dolin, MD;  Location: [facility];  Service: Endoscopy;;  . Esophagogastroduodenoscopy (egd) with propofol N/A  07/24/2014    SLF:  1. 2 columns grade 2-3 varices- 2 Bands applied.  2.  Moderate gastropathy 3. Duodenal Diverticula  . Esophageal banding N/A 07/24/2014    Procedure: ESOPHAGEAL BANDING (2 bands applied);  Surgeon: Danie Binder, MD;  Location: [facility] ORS;  Service: Endoscopy;  Laterality: N/A;  . Central venous catheter insertion Right     Prior to Admission medications   Medication Sig Start Date End Date Taking? Authorizing Provider  folic acid (FOLVITE) 1 MG tablet Take 1 tablet (1 mg total) by mouth daily. 08/28/14   Radene Gunning, NP  lidocaine (XYLOCAINE) 2 % solution 2 TSP  PO QAC AND HS TO PREVENT FOR CHEST PAIN WHILE EATING Patient taking differently: Use as directed 10 mLs in the mouth or throat See admin instructions. 2 TSP  PO QAC AND HS TO PREVENT FOR CHEST PAIN WHILE EATING 07/26/14   Danie Binder, MD  loperamide (IMODIUM) 2 MG capsule Take 1 capsule (2 mg total) by mouth 2 (two) times daily as needed for diarrhea or loose stools. 08/28/14   Radene Gunning, NP  LORazepam (ATIVAN) 1 MG tablet Take 1 tablet (1 mg total) by mouth every 12 (twelve) hours as needed for anxiety. 08/30/14   Radene Gunning, NP  magnesium oxide (MAG-OX) 400 (241.3 MG) MG tablet Take 1 tablet (400 mg total) by mouth daily. Patient taking differently: Take 400 mg by mouth once a week.  04/27/14   Rexene Alberts, MD  metolazone (ZAROXOLYN) 5 MG tablet Take 10 mg by mouth daily.     Historical Provider, MD  midodrine (PROAMATINE) 10 MG tablet Take 1 tablet (10 mg total) by mouth 2 (two) times daily with a meal. Medication to keep your blood pressure from falling. 04/27/14   Rexene Alberts, MD  Nutritional Supplements (FEEDING SUPPLEMENT, NEPRO CARB STEADY,) LIQD Take 237 mLs by mouth as needed (missed meal during dialysis.). 08/28/14   Radene Gunning, NP  Oxycodone HCl 10 MG TABS Take 1 tablet (10 mg total) by mouth every 6 (six) hours as needed. Patient taking differently: Take 10 mg by mouth every 6 (six) hours as  needed (pain).  09/05/14   Danie Binder, MD  pantoprazole (PROTONIX) 40 MG tablet Take 1 tablet (40 mg total) by mouth daily. 07/07/14   Donne Hazel, MD  promethazine (PHENERGAN) 12.5 MG tablet Take 1 tablet (12.5 mg total) by mouth every 6 (six) hours as needed for nausea or vomiting. 09/05/14   Danie Binder, MD    Current Facility-Administered Medications  Medication Dose Route Frequency Provider Last Rate Last Dose  . 0.9 %  sodium chloride infusion  250 mL Intravenous PRN Phillips Grout, MD      . antiseptic oral rinse (CPC / CETYLPYRIDINIUM CHLORIDE 0.05%) solution 7 mL  7 mL Mouth Rinse BID Phillips Grout, MD   7 mL at 09/16/14 0245  . cefTRIAXone (ROCEPHIN) 1 g in dextrose 5 % 50 mL IVPB - Premix  1 g Intravenous Once Rogene Houston, MD      . metoCLOPramide (REGLAN) injection 10 mg  10 mg Intravenous Once Rogene Houston, MD      .  octreotide (SANDOSTATIN) 500 mcg in sodium chloride 0.9 % 250 mL (2 mcg/mL) infusion  50 mcg/hr Intravenous Continuous Kristen N Ward, DO 25 mL/hr at 09/16/14 0029 50 mcg/hr at 09/16/14 0029  . ondansetron (ZOFRAN) tablet 4 mg  4 mg Oral Q6H PRN Phillips Grout, MD       Or  . ondansetron [facility]) injection 4 mg  4 mg Intravenous Q6H PRN Phillips Grout, MD   4 mg at 09/16/14 3662  . pantoprazole (PROTONIX) 80 mg in sodium chloride 0.9 % 250 mL (0.32 mg/mL) infusion  8 mg/hr Intravenous Continuous Kristen N Ward, DO 25 mL/hr at 09/16/14 0116 8 mg/hr at 09/16/14 0116  . sodium chloride 0.9 % injection 3 mL  3 mL Intravenous Q12H Phillips Grout, MD      . sodium chloride 0.9 % injection 3 mL  3 mL Intravenous PRN Phillips Grout, MD        Allergies as of 09/15/2014 - Review Complete 09/15/2014  Allergen Reaction Noted  . Lasix [furosemide]  04/12/2014    Family History  Problem Relation Age of Onset  . Heart disease Mother   . Colon cancer Neg Hx   . Liver disease Neg Hx     History   Social History  . Marital Status: Single    Spouse Name:  N/A    Number of Children: 1  . Years of Education: N/A   Occupational History  . unemployed    Social History Main Topics  . Smoking status: Former Smoker -- 0.25 packs/day for 20 years    Types: Cigarettes    Quit date: 11/05/2008  . Smokeless tobacco: Former Systems developer  . Alcohol Use: No  . Drug Use: No  . Sexual Activity:    Partners: Male    Birth Control/ Protection: None   Other Topics Concern  . Not on file   Social History Narrative    Review of Systems: See HPI, otherwise normal ROS  Physical Exam: Temp:  [97.5 F (36.4 C)-98.4 F (36.9 C)] 97.5 F (36.4 C) (02/07 0630) Pulse Rate:  [81-125] 82 (02/07 0700) Resp:  [13-22] 18 (02/07 0700) BP: (78-102)/(29-51) 81/35 mmHg (02/07 0700) SpO2:  [100 %] 100 % (02/07 0700) Weight:  [111 lb 1.8 oz (50.4 kg)-118 lb (53.524 kg)] 111 lb 1.8 oz (50.4 kg) (02/07 0312) Last BM Date: 09/16/14 Well-developed thin Caucasian female who is in no acute distress. She is alert and does not have asterixis. Conjunctiva is pale. Sclera is mildly icteric. Oropharyngeal mucosa is normal. Dentition in satisfactory condition. No neck masses or thyromegaly noted. Cardiac exam with regular rhythm normal S1 and S2. No murmur or gallop noted. Lungs are clear to auscultation. Abdomen is full. She has dressing in right midabdomen from recent tap. She also has umbilical hernia with the bluish skin discoloration and is completely reducible. On palpation abdomen is soft. Spleen tip is easily palpable. Liver is firm with prominent left lobe. Both flanks are dull. Extremities thin. No clubbing noted.  Lab Results:  Recent Labs  09/16/14 0020  WBC 8.4  HGB 6.5*  HCT 19.7*  PLT 126*   BMET  Recent Labs  09/16/14 0020  NA 137  K 3.3*  CL 102  CO2 26  GLUCOSE 122*  BUN 80*  CREATININE 2.15*  CALCIUM 8.4   LFT  Recent Labs  09/16/14 0020  PROT 5.4*  ALBUMIN 2.7*  AST 37  ALT 14  ALKPHOS 57  BILITOT 2.6*  PT/INR  Recent  Labs  09/16/14 0020  LABPROT 18.0*  INR 1.47   Hepatitis Panel No results for input(s): HEPBSAG, HCVAB, HEPAIGM, HEPBIGM in the last 72 hours.  Studies/Results: US Paracentesis  09/14/2014   CLINICAL DATA:  Ascites  EXAM: ULTRASOUND GUIDED  PARACENTESIS  COMPARISON:  None.  PROCEDURE: An ultrasound guided paracentesis was thoroughly discussed with the patient and questions answered. The benefits, risks, alternatives and complications were also discussed. The patient understands and wishes to proceed with the procedure. Written consent was obtained.  Ultrasound was performed to localize and mark an adequate pocket of fluid in the right lower quadrant of the abdomen. The area was then prepped and draped in the normal sterile fashion. 1% Lidocaine was used for local anesthesia. Under ultrasound guidance a 19 gauge Yueh catheter was introduced. Paracentesis was performed. The catheter was removed and a dressing applied.  Complications: None.  FINDINGS: A total of approximately 4.7 L of clear yellow fluid was removed. A fluid sample was not sent for laboratory analysis.  IMPRESSION: Successful ultrasound guided paracentesis yielding 4.7 L of ascites.   Electronically Signed   By: Rolm Baptise M.D.   On: 09/14/2014 13:08    Assessment; Patient is 39 year old Caucasian female who has decompensated cirrhosis secondary to NAFLD complicated by ascites requiring periodic abdominal paracenteses hepatic encephalopathy and history of esophageal variceal bleed who has undergone banding 3 times in the past most recently on 07/24/2014 now presents with hematemesis melena and hemodynamic instability. Agents hemoglobin on admission was 6.5 g and she has received 3 units of PRBCs. Suspect she has recurrent esophageal variceal bleed and will need EGD with banding or other intervention as appropriate. Patient is scheduled to undergo EGD with banding by Dr. Oneida Alar on 09/18/2014 but obviously she cannot wait another two  days.   Recommendations; Ceftriaxone 1 g IV every 24 hours. Metoclopramide 10 mg IV once. Esophagogastroduodenoscopy with therapeutic intervention. Patient will need airway protection therefore will ask anesthesia to assist.   LOS: 1 day   Kamon Fahr U  09/16/2014, 7:59 AM

## 2014-09-16 NOTE — Progress Notes (Addendum)
Pt extubated per MD order by respiratory at 1140. Pt is alert and oriented with stable VS: HR 80, BP 87/59 spo2 100% on 2L [place], RR 17.

## 2014-09-16 NOTE — Progress Notes (Signed)
Pt intubated in order to have bedside EGD performed. Pt received 5mL of etomidate at 1048 and 30mL of succinylcholine at 1048. ETT tube was placed ay 1050 and vent was turned on at 1052. ETT is 21 at the lip and it was a size 7. All VS are stable and pt is sating 100% on ventilator. Pt is sedated with continuous propofol infusion at this time. Pt tolerated procedure well and GI MD is at bedside to perform EGD. Will continue to monitior.

## 2014-09-16 NOTE — Progress Notes (Signed)
Pt requesting something to treat 7/10 aching abdominal pain. MD made aware and IV morphine was ordered. Pt received IV morphine and shortly after experienced "itching", redness, and hives in her wrist local to the area of the IV where the morphine was administered. Pt is sating 100% on RA and VS are stable. She does not report any dyspnea or SOB. MD has been made aware. Orders received. Will continue to monitor.

## 2014-09-17 ENCOUNTER — Encounter (HOSPITAL_COMMUNITY): Payer: Self-pay | Admitting: Internal Medicine

## 2014-09-17 DIAGNOSIS — K259 Gastric ulcer, unspecified as acute or chronic, without hemorrhage or perforation: Secondary | ICD-10-CM

## 2014-09-17 DIAGNOSIS — R110 Nausea: Secondary | ICD-10-CM

## 2014-09-17 DIAGNOSIS — K7460 Unspecified cirrhosis of liver: Secondary | ICD-10-CM

## 2014-09-17 DIAGNOSIS — K920 Hematemesis: Secondary | ICD-10-CM

## 2014-09-17 HISTORY — DX: Gastric ulcer, unspecified as acute or chronic, without hemorrhage or perforation: K25.9

## 2014-09-17 LAB — COMPREHENSIVE METABOLIC PANEL
ALT: 17 U/L (ref 0–35)
ANION GAP: 9 (ref 5–15)
AST: 53 U/L — ABNORMAL HIGH (ref 0–37)
Albumin: 2.8 g/dL — ABNORMAL LOW (ref 3.5–5.2)
Alkaline Phosphatase: 54 U/L (ref 39–117)
BUN: 89 mg/dL — ABNORMAL HIGH (ref 6–23)
CO2: 21 mmol/L (ref 19–32)
Calcium: 9.1 mg/dL (ref 8.4–10.5)
Chloride: 107 mmol/L (ref 96–112)
Creatinine, Ser: 2.64 mg/dL — ABNORMAL HIGH (ref 0.50–1.10)
GFR calc Af Amer: 25 mL/min — ABNORMAL LOW (ref >90)
GFR calc non Af Amer: 21 mL/min — ABNORMAL LOW (ref >90)
Glucose, Bld: 85 mg/dL (ref 70–99)
Potassium: 3.3 mmol/L — ABNORMAL LOW (ref 3.5–5.1)
Sodium: 137 mmol/L (ref 135–145)
Total Bilirubin: 3.3 mg/dL — ABNORMAL HIGH (ref 0.3–1.2)
Total Protein: 5.6 g/dL — ABNORMAL LOW (ref 6.0–8.3)

## 2014-09-17 LAB — CBC
HCT: 30.6 % — ABNORMAL LOW (ref 36.0–46.0)
HEMATOCRIT: 32.2 % — AB (ref 36.0–46.0)
HEMOGLOBIN: 10.8 g/dL — AB (ref 12.0–15.0)
Hemoglobin: 10.2 g/dL — ABNORMAL LOW (ref 12.0–15.0)
MCH: 30.9 pg (ref 26.0–34.0)
MCH: 30.8 pg (ref 26.0–34.0)
MCHC: 33.5 g/dL (ref 30.0–36.0)
MCHC: 33.3 g/dL (ref 30.0–36.0)
MCV: 92 fL (ref 78.0–100.0)
MCV: 92.4 fL (ref 78.0–100.0)
Platelets: 73 10*3/uL — ABNORMAL LOW (ref 150–400)
Platelets: 104 10*3/uL — ABNORMAL LOW (ref 150–400)
RBC: 3.5 MIL/uL — ABNORMAL LOW (ref 3.87–5.11)
RBC: 3.31 MIL/uL — ABNORMAL LOW (ref 3.87–5.11)
RDW: 22.7 % — ABNORMAL HIGH (ref 11.5–15.5)
RDW: 22.6 % — AB (ref 11.5–15.5)
WBC: 7.1 10*3/uL (ref 4.0–10.5)
WBC: 5.8 10*3/uL (ref 4.0–10.5)

## 2014-09-17 MED ORDER — POTASSIUM CHLORIDE 10 MEQ/100ML IV SOLN
10.0000 meq | INTRAVENOUS | Status: AC
  Administered 2014-09-17 (×3): 10 meq via INTRAVENOUS
  Filled 2014-09-17 (×3): qty 100

## 2014-09-17 NOTE — Progress Notes (Signed)
[facility] PROGRESS NOTE  Wanda [name] TML:[id number] DOB: 1976-05-22 DOA: 09/15/2014 PCP: [name]    Code Status: Full code Family Communication: Discussed with patient; she does not wish that the medical staff discuss her medical condition with her son. Disposition Plan: Discharged to home in clinically appropriate   Consultants:  Gastroenterology  nephrology  Procedures: 1.)  09/16/14-EGD by Dr. Laural Golden: Single short column of varix proximal to GE junction not large enough to be banded. Two small ulcers at GEJ felt to be source of GI bleeding but no active bleeding noted. No therapy rendered. Portal gastropathy. No evidence of peptic ulcer disease or gastric varices.  2.) Hemodialysis, pending   Antibiotics:   Rocephin 1 on 09/16/14.  HPI/Subjective: Patient says that her abdominal pain is less with when necessary oxycodone. She denies nausea or vomiting. Her sore throat is being relieved with as needed Chloraseptic spray. She says that she had a bowel movement last night which was maroon colored.   Objective: Filed Vitals:   09/17/14 0730  BP:   Pulse:   Temp: 97.6 F (36.4 C)  Resp:    temperature 97.6. Heart rate 59. Respiratory rate 12. Blood pressure 99/55. Oxygen saturation 98% on room air.   Intake/Output Summary (Last 24 hours) at 09/17/14 1050 Last data filed at 09/17/14 0600  Gross per 24 hour  Intake 2916.86 ml  Output    500 ml  Net 2416.86 ml   Filed Weights   09/15/14 2323 09/16/14 0312 09/17/14 0500  Weight: 53.524 kg (118 lb) 50.4 kg (111 lb 1.8 oz) 53.3 kg (117 lb 8.1 oz)    Exam:   General:  ill-appearing 39 year old woman laying in bed, in no acute distress.   HEENT: Scleral icterus   Cardiovascular: S1, S2, no murmurs rubs or gallops.   Respiratory: clear anteriorly with decreased breath sounds in the bases.   Abdomen: positive bowel sounds, soft, mild to moderate ascites-not tense; mildly diffusely  tender.   Musculoskeletal/extremities: No acute hot red joints;trace to 1+ bilateral lower extremity edema.   Neurologic/psychiatric: She has a very flat affect. She does not provide consistent eye contact. She is alert and oriented 3. Cranial nerves II through XII are intact.   Data Reviewed: Basic Metabolic Panel:  Recent Labs Lab 09/16/14 0020 09/16/14 0741 09/17/14 0440  NA 137 135 137  K 3.3* 3.9 3.3*  CL 102 106 107  CO2 26 24 21   GLUCOSE 122* 120* 85  BUN 80* 90* 89*  CREATININE 2.15* 2.34* 2.64*  CALCIUM 8.4 8.4 9.1   Liver Function Tests:  Recent Labs Lab 09/16/14 0020 09/16/14 0741 09/17/14 0440  AST 37 36 53*  ALT 14 14 17   ALKPHOS 57 48 54  BILITOT 2.6* 3.7* 3.3*  PROT 5.4* 4.9* 5.6*  ALBUMIN 2.7* 2.5* 2.8*   No results for input(s): LIPASE, AMYLASE in the last 168 hours.  Recent Labs Lab 09/16/14 0020  AMMONIA 53*   CBC:  Recent Labs Lab 09/16/14 0020 09/16/14 0741 09/16/14 1351 09/16/14 2020 09/17/14 0440 09/17/14 0907  WBC 8.4 7.8 7.7 8.3 7.1 5.8  NEUTROABS 5.2  --   --   --   --   --   HGB 6.5* 7.8* 10.7* 11.0* 10.8* 10.2*  HCT 19.7* 23.0* 31.6* 32.3* 32.2* 30.6*  MCV 105.3* 94.3 91.6 90.7 92.0 92.4  PLT 126* 107* 100* 112* 73* 104*   Cardiac Enzymes: No results for input(s): CKTOTAL, CKMB, CKMBINDEX, TROPONINI in the  last 168 hours. BNP (last 3 results) No results for input(s): BNP in the last 8760 hours.  ProBNP (last 3 results) No results for input(s): PROBNP in the last 8760 hours.  CBG: No results for input(s): GLUCAP in the last 168 hours.  Recent Results (from the past 240 hour(s))  MRSA PCR Screening     Status: None   Collection Time: 09/16/14  2:39 AM  Result Value Ref Range Status   MRSA by PCR NEGATIVE NEGATIVE Final    Comment:        The GeneXpert MRSA Assay (FDA approved for NASAL specimens only), is one component of a comprehensive MRSA colonization surveillance program. It is not intended to diagnose  MRSA infection nor to guide or monitor treatment for MRSA infections.      Studies: No results found.  Scheduled Meds: . antiseptic oral rinse  7 mL Mouth Rinse BID  . sodium chloride  3 mL Intravenous Q12H  . sucralfate  1 g Oral TID WC & HS   Continuous Infusions: . sodium chloride 20 mL/hr at 09/16/14 1735  . octreotide  (SANDOSTATIN)    IV infusion 50 mcg/hr (09/17/14 0425)  . pantoprozole (PROTONIX) infusion 8 mg/hr (09/17/14 0220)   Assessment plan:  Principal Problem:   Hematemesis/vomiting blood Active Problems:   Liver cirrhosis secondary to nonalcoholic steatohepatitis (NASH)   Esophageal varices   Anemia associated with acute blood loss   Gastroesophageal junction ulcer   Portal hypertensive gastropathy   Cirrhosis of liver with ascites   Thrombocytopenia   ESRD (end stage renal disease) on dialysis   Chronic hypotension    1.Hematemesis/upper GI bleeding. Per EGD by Dr. Laural Golden, he believes the bleeding was secondary to the GE junction ulcer. We'll continue octreotide and Protonix drip as ordered. GI added sucralfate. Bleeding has resolved. Continue when necessary Cepacol for throat pain. Continue supportive treatment. Diet advanced per GI.  Acute blood loss anemia. Patient's hemoglobin was 6.5 on admission. She was transfused 2 units of packed red blood cells. Her hemoglobin has improved and been stable thus far.  NASH cirrhosis. This appears to be very severe. She has associated hyperbilirubinemia, spleenomegaly, esophageal varices, portal hypertension gastropathy, chronic thrombocytopenia, and coagulopathy. Patient was thought not to be able to tolerate a small dose of nonselective beta blockers secondary to chronic hypotension per GI. Her ammonia level is modestly elevated. She showed no signs of hepatic encephalopathy. Further management per GI. The patient is to follow-up at [facility] as recommended by Dr. Oneida Alar.  Ascites secondary to  cirrhosis. The patient underwent a paracentesis on 09/14/14 prior to the hospitalization. It yielded 4.7 L of fluid.  Chronic hypotension. Status post gentle IV fluids and packed red blood cell transfusions. Her blood pressure is better, but is chronically low.  Recent diagnosis of end-stage renal disease. Nephrology has been consulted for hemodialysis.    Time spent: 35 minutes.     [facility] Pager [phone] . If 7PM-7AM, please contact night-coverage at [email or web address], password [facility] 09/17/2014, 10:50 AM  LOS: 2 days

## 2014-09-17 NOTE — Consult Note (Signed)
Reason for Consult: End-stage renal disease Referring Physician: Dr. Arvin Collard Wanda [name] is an 39 y.o. female.  HPI: She is a patient who has history of liver cirrhosis, renal failure on dialysis presently came with complaints of vomiting of blood and hematemesis. Patient was found to have severe anemia. Patient was seen by GI and she has upper endoscopy with bleeding from varices. Patient also has received blood transfusion. Presently she says she is feeling much better and no nausea vomiting since Saturday. Patient also denies any difficulty in breathing.   Past Medical History  DI have reviewed the patient's current medications.iagnosis Date  . GERD (gastroesophageal reflux disease)   . 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 c/w steatohepatitis  . Folate deficiency 09/2013  . Anasarca 10/10/2013  . Hematemesis/vomiting blood 02/24/2014  . Acute blood loss anemia 02/25/2014    Status post transfusion  . Macrocytosis 02/28/2014  . 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 Acute renal failure 09/2013    Pre-renal- resolved  . C. difficile colitis 04/19/2014  . Anxiety   . Depression   . Thrombocytopenia     Hypercellular bone marrow; abundant megakaryocytes per 08/27/2014; s/p bone marrow bx  . Dialysis patient   . Cirrhosis of liver with ascites   . SBP (spontaneous bacterial peritonitis) 11/10/2013  . Bipolar disorder 12/04/2013    2007-SEEN IN ED FOR INVOLUNTARY COMMITMENT, UDS POS FOR AMPHETAMINES/OPIATES   . PNA (pneumonia) 10/13/2013  . Chronic hypotension     Past Surgical History  Procedure Laterality Date  . None    . Paracentesis  Feb 2015    1180 fluid, negative fluid analysis.   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 Paracentesis  10/2013  . Colonoscopy N/A 12/19/2013    SLF:NO  OBVIOUS SOURCE FOR ANEMIA IDETIFIED/ONE COLON POLYP REMOVED/Small internal hemorrhoids  . Esophagogastroduodenoscopy N/A 02/11/2014    Dr. Rourk:Esophageal varices with bleeding stigmata-status post esophageal band ligation therapy. Portal gastropathy  . Esophagogastroduodenoscopy N/A 07/04/2014    RMR: Persiting grade 2 esophageal varicies with bleeding stigmata status post band ligation. Significantly congested gastric mucosa iwith changes constistant with protal gastropathy.   . Esophageal banding  07/04/2014    Procedure: ESOPHAGEAL BANDING;  Surgeon: Daneil Dolin, MD;  Location: [facility];  Service: Endoscopy;;  . Esophagogastroduodenoscopy (egd) with propofol N/A 07/24/2014    SLF:  1. 2 columns grade 2-3 varices- 2 Bands applied.  2.  Moderate gastropathy 3. Duodenal Diverticula  . Esophageal banding N/A 07/24/2014    Procedure: ESOPHAGEAL BANDING (2 bands applied);  Surgeon: Danie Binder, MD;  Location: [facility] ORS;  Service: Endoscopy;  Laterality: N/A;  . Central venous catheter insertion Right     Family History  Problem Relation Age of Onset  . Heart disease Mother   . Colon cancer Neg Hx   . Liver disease Neg Hx     Social History:  reports that she quit smoking about 5 years ago. Her smoking use included Cigarettes. She has a 5 pack-year smoking history. She has quit using smokeless tobacco. She reports that she does not drink alcohol or use illicit drugs.  Allergies:  Allergies  Allergen Reactions  . Lasix [Furosemide]     "doesn't work"  . Morphine And Related Hives and Itching    Medications: I have  reviewed the patient's current medications.  Results for orders placed or performed during the hospital encounter of 09/15/14 (from the past 48 hour(s))  POC occult blood, ED     Status: Abnormal   Collection Time: 09/15/14 11:51 PM  Result Value Ref Range   Fecal Occult Bld POSITIVE (A) NEGATIVE  CBC with Differential     Status: Abnormal   Collection Time:  09/16/14 12:20 AM  Result Value Ref Range   WBC 8.4 4.0 - 10.5 K/uL   RBC 1.87 (L) 3.87 - 5.11 MIL/uL   Hemoglobin 6.5 (LL) 12.0 - 15.0 g/dL    Comment: RESULT REPEATED AND VERIFIED CRITICAL RESULT CALLED TO, READ BACK BY AND VERIFIED WITH:  HAYMORE,R @ 0053 ON 09/16/14 BY WOODIE,J    HCT 19.7 (L) 36.0 - 46.0 %   MCV 105.3 (H) 78.0 - 100.0 fL   MCH 34.8 (H) 26.0 - 34.0 pg   MCHC 33.0 30.0 - 36.0 g/dL   RDW 18.6 (H) 11.5 - 15.5 %   Platelets 126 (L) 150 - 400 K/uL   Neutrophils Relative % 63 43 - 77 %   Neutro Abs 5.2 1.7 - 7.7 K/uL   Lymphocytes Relative 26 12 - 46 %   Lymphs Abs 2.2 0.7 - 4.0 K/uL   Monocytes Relative 9 3 - 12 %   Monocytes Absolute 0.8 0.1 - 1.0 K/uL   Eosinophils Relative 1 0 - 5 %   Eosinophils Absolute 0.1 0.0 - 0.7 K/uL   Basophils Relative 1 0 - 1 %   Basophils Absolute 0.1 0.0 - 0.1 K/uL  Comprehensive metabolic panel     Status: Abnormal   Collection Time: 09/16/14 12:20 AM  Result Value Ref Range   Sodium 137 135 - 145 mmol/L   Potassium 3.3 (L) 3.5 - 5.1 mmol/L   Chloride 102 96 - 112 mmol/L   CO2 26 19 - 32 mmol/L   Glucose, Bld 122 (H) 70 - 99 mg/dL   BUN 80 (H) 6 - 23 mg/dL   Creatinine, Ser 2.15 (H) 0.50 - 1.10 mg/dL   Calcium 8.4 8.4 - 10.5 mg/dL   Total Protein 5.4 (L) 6.0 - 8.3 g/dL   Albumin 2.7 (L) 3.5 - 5.2 g/dL   AST 37 0 - 37 U/L   ALT 14 0 - 35 U/L   Alkaline Phosphatase 57 39 - 117 U/L   Total Bilirubin 2.6 (H) 0.3 - 1.2 mg/dL   GFR calc non Af Amer 28 (L) >90 mL/min   GFR calc Af Amer 32 (L) >90 mL/min    Comment: (NOTE) The eGFR has been calculated using the CKD EPI equation. This calculation has not been validated in all clinical situations. eGFR's persistently <90 mL/min signify possible Chronic Kidney Disease.    Anion gap 9 5 - 15  APTT     Status: None   Collection Time: 09/16/14 12:20 AM  Result Value Ref Range   aPTT 36 24 - 37 seconds  Protime-INR     Status: Abnormal   Collection Time: 09/16/14 12:20 AM   Result Value Ref Range   Prothrombin Time 18.0 (H) 11.6 - 15.2 seconds   INR 1.47 0.00 - 1.49  Type and screen     Status: None (Preliminary result)   Collection Time: 09/16/14 12:20 AM  Result Value Ref Range   ABO/RH(D) A POS    Antibody Screen NEG    Sample Expiration 09/19/2014    Unit Number [id number]  Blood Component Type RED CELLS,LR    Unit division 00    Status of Unit ISSUED,FINAL    Transfusion Status OK TO TRANSFUSE    Crossmatch Result Compatible    Unit Number [id number]    Blood Component Type RED CELLS,LR    Unit division 00    Status of Unit ISSUED,FINAL    Transfusion Status OK TO TRANSFUSE    Crossmatch Result Compatible    Unit Number [id number]    Blood Component Type RED CELLS,LR    Unit division 00    Status of Unit ISSUED,FINAL    Transfusion Status OK TO TRANSFUSE    Crossmatch Result Compatible    Unit Number [id number]    Blood Component Type RED CELLS,LR    Unit division 00    Status of Unit ISSUED,FINAL    Transfusion Status OK TO TRANSFUSE    Crossmatch Result Compatible    Unit Number [id number]    Blood Component Type RED CELLS,LR    Unit division 00    Status of Unit ISSUED,FINAL    Transfusion Status OK TO TRANSFUSE    Crossmatch Result Compatible    Unit Number [id number]    Blood Component Type RED CELLS,LR    Unit division 00    Status of Unit ALLOCATED    Transfusion Status OK TO TRANSFUSE    Crossmatch Result Compatible    Unit Number [id number]    Blood Component Type RED CELLS,LR    Unit division 00    Status of Unit ALLOCATED    Transfusion Status OK TO TRANSFUSE    Crossmatch Result Compatible    Unit Number [id number]    Blood Component Type RED CELLS,LR    Unit division 00    Status of Unit REL FROM [facility]    Transfusion Status OK TO TRANSFUSE    Crossmatch Result Compatible    Unit Number [id number]    Blood Component Type RED CELLS,LR    Unit division 00    Status of Unit REL FROM  [facility]    Transfusion Status OK TO TRANSFUSE    Crossmatch Result Compatible   Ammonia     Status: Abnormal   Collection Time: 09/16/14 12:20 AM  Result Value Ref Range   Ammonia 53 (H) 11 - 32 umol/L  Prepare RBC     Status: None   Collection Time: 09/16/14 12:31 AM  Result Value Ref Range   Order Confirmation ORDER PROCESSED BY BLOOD BANK   MRSA PCR Screening     Status: None   Collection Time: 09/16/14  2:39 AM  Result Value Ref Range   MRSA by PCR NEGATIVE NEGATIVE    Comment:        The GeneXpert MRSA Assay (FDA approved for NASAL specimens only), is one component of a comprehensive MRSA colonization surveillance program. It is not intended to diagnose MRSA infection nor to guide or monitor treatment for MRSA infections.   CBC     Status: Abnormal   Collection Time: 09/16/14  7:41 AM  Result Value Ref Range   WBC 7.8 4.0 - 10.5 K/uL   RBC 2.44 (L) 3.87 - 5.11 MIL/uL   Hemoglobin 7.8 (L) 12.0 - 15.0 g/dL   HCT 23.0 (L) 36.0 - 46.0 %   MCV 94.3 78.0 - 100.0 fL    Comment: DELTA CHECK NOTED RESULT REPEATED AND VERIFIED POST TRANSFUSION SPECIMEN    MCH 32.0 26.0 - 34.0 pg   MCHC 33.9 30.0 - 36.0 g/dL  RDW 23.0 (H) 11.5 - 15.5 %   Platelets 107 (L) 150 - 400 K/uL    Comment: SPECIMEN CHECKED FOR CLOTS PLATELET COUNT CONFIRMED BY SMEAR   Comprehensive metabolic panel     Status: Abnormal   Collection Time: 09/16/14  7:41 AM  Result Value Ref Range   Sodium 135 135 - 145 mmol/L   Potassium 3.9 3.5 - 5.1 mmol/L   Chloride 106 96 - 112 mmol/L   CO2 24 19 - 32 mmol/L   Glucose, Bld 120 (H) 70 - 99 mg/dL   BUN 90 (H) 6 - 23 mg/dL   Creatinine, Ser 2.34 (H) 0.50 - 1.10 mg/dL   Calcium 8.4 8.4 - 10.5 mg/dL   Total Protein 4.9 (L) 6.0 - 8.3 g/dL   Albumin 2.5 (L) 3.5 - 5.2 g/dL   AST 36 0 - 37 U/L   ALT 14 0 - 35 U/L   Alkaline Phosphatase 48 39 - 117 U/L   Total Bilirubin 3.7 (H) 0.3 - 1.2 mg/dL   GFR calc non Af Amer 25 (L) >90 mL/min   GFR calc Af Amer 29 (L)  >90 mL/min    Comment: (NOTE) The eGFR has been calculated using the CKD EPI equation. This calculation has not been validated in all clinical situations. eGFR's persistently <90 mL/min signify possible Chronic Kidney Disease.    Anion gap 5 5 - 15  CBC     Status: Abnormal   Collection Time: 09/16/14  1:51 PM  Result Value Ref Range   WBC 7.7 4.0 - 10.5 K/uL   RBC 3.45 (L) 3.87 - 5.11 MIL/uL   Hemoglobin 10.7 (L) 12.0 - 15.0 g/dL    Comment: DELTA CHECK NOTED POST TRANSFUSION SPECIMEN    HCT 31.6 (L) 36.0 - 46.0 %   MCV 91.6 78.0 - 100.0 fL   MCH 31.0 26.0 - 34.0 pg   MCHC 33.9 30.0 - 36.0 g/dL   RDW 21.2 (H) 11.5 - 15.5 %   Platelets 100 (L) 150 - 400 K/uL  CBC     Status: Abnormal   Collection Time: 09/16/14  8:20 PM  Result Value Ref Range   WBC 8.3 4.0 - 10.5 K/uL   RBC 3.56 (L) 3.87 - 5.11 MIL/uL   Hemoglobin 11.0 (L) 12.0 - 15.0 g/dL   HCT 32.3 (L) 36.0 - 46.0 %   MCV 90.7 78.0 - 100.0 fL   MCH 30.9 26.0 - 34.0 pg   MCHC 34.1 30.0 - 36.0 g/dL   RDW 21.9 (H) 11.5 - 15.5 %   Platelets 112 (L) 150 - 400 K/uL    Comment: SPECIMEN CHECKED FOR CLOTS  CBC     Status: Abnormal   Collection Time: 09/17/14  4:40 AM  Result Value Ref Range   WBC 7.1 4.0 - 10.5 K/uL   RBC 3.50 (L) 3.87 - 5.11 MIL/uL   Hemoglobin 10.8 (L) 12.0 - 15.0 g/dL   HCT 32.2 (L) 36.0 - 46.0 %   MCV 92.0 78.0 - 100.0 fL   MCH 30.9 26.0 - 34.0 pg   MCHC 33.5 30.0 - 36.0 g/dL   RDW 22.7 (H) 11.5 - 15.5 %   Platelets 73 (L) 150 - 400 K/uL    Comment: SPECIMEN CHECKED FOR CLOTS PLATELET COUNT CONFIRMED BY SMEAR   Comprehensive metabolic panel     Status: Abnormal   Collection Time: 09/17/14  4:40 AM  Result Value Ref Range   Sodium 137 135 - 145 mmol/L  Potassium 3.3 (L) 3.5 - 5.1 mmol/L   Chloride 107 96 - 112 mmol/L   CO2 21 19 - 32 mmol/L   Glucose, Bld 85 70 - 99 mg/dL   BUN 89 (H) 6 - 23 mg/dL   Creatinine, Ser 2.64 (H) 0.50 - 1.10 mg/dL   Calcium 9.1 8.4 - 10.5 mg/dL   Total Protein  5.6 (L) 6.0 - 8.3 g/dL   Albumin 2.8 (L) 3.5 - 5.2 g/dL   AST 53 (H) 0 - 37 U/L   ALT 17 0 - 35 U/L   Alkaline Phosphatase 54 39 - 117 U/L   Total Bilirubin 3.3 (H) 0.3 - 1.2 mg/dL   GFR calc non Af Amer 21 (L) >90 mL/min   GFR calc Af Amer 25 (L) >90 mL/min    Comment: (NOTE) The eGFR has been calculated using the CKD EPI equation. This calculation has not been validated in all clinical situations. eGFR's persistently <90 mL/min signify possible Chronic Kidney Disease.    Anion gap 9 5 - 15    No results found.  Review of Systems  Constitutional: Positive for malaise/fatigue. Negative for fever.  Respiratory: Negative for shortness of breath.   Cardiovascular: Positive for leg swelling. Negative for orthopnea.  Gastrointestinal: Positive for nausea, vomiting, abdominal pain and melena.  Neurological: Positive for weakness.   Blood pressure 99/55, pulse 59, temperature 97.8 F (36.6 C), temperature source Oral, resp. rate 12, height 5' 2"  (1.575 m), weight 53.3 kg (117 lb 8.1 oz), SpO2 98 %. Physical Exam  Constitutional: She is oriented to person, place, and time. No distress.  HENT:  Mouth/Throat: No oropharyngeal exudate.  Neck: No JVD present.  Cardiovascular: Normal rate and regular rhythm.   No murmur heard. Respiratory: No respiratory distress.  GI: She exhibits distension. There is no tenderness. There is no rebound.  Musculoskeletal: She exhibits edema.  Neurological: She is alert and oriented to person, place, and time.    Assessment/Plan: Problem #1 upper GI bleeding. This is thought to be from esophageal varices. Presently patient has received blood transfusion and her hemoglobin and hematocrit is stable. Problem #2 end-stage renal disease she is status post hemodialysis on Saturday. Patient is due for dialysis tomorrow. Problem #3 liver cirrhosis Problem #4 anasarca Problem #5 bipolar disorder Problem #6 hypotension Problem #7 metabolic bone disease: Her  calcium is within reasonable range. Plan: We'll make arrangements for patient to get dialysis tomorrow. We'll check her basic metabolic panel and CBC in the morning.   Arjan Strohm S 09/17/2014, 7:41 AM

## 2014-09-17 NOTE — Progress Notes (Signed)
    Subjective: No abdominal pain, N/V. No further evidence of overt GI bleeding.   Objective: Vital signs in last 24 hours: Temp:  [97.8 F (36.6 C)-98.2 F (36.8 C)] 97.8 F (36.6 C) (02/08 0400) Pulse Rate:  [59-130] 59 (02/08 0600) Resp:  [6-31] 12 (02/08 0600) BP: (75-168)/(34-103) 99/55 mmHg (02/08 0430) SpO2:  [97 %-100 %] 98 % (02/08 0600) FiO2 (%):  [50 %] 50 % (02/07 1052) Weight:  [117 lb 8.1 oz (53.3 kg)] 117 lb 8.1 oz (53.3 kg) (02/08 0500) Last BM Date: 09/16/14 General:   Alert and oriented, flat affect Head:  Normocephalic and atraumatic. Eyes:  Mild icterus Abdomen:  Bowel sounds present, full but soft, small umbilical henira that is reducible. Liver prominent Extremities:  Without lower extremity edema. Neurologic:  Alert and  oriented x4 Psych:  Alert and cooperative. Flat affect  Intake/Output from previous day: 02/07 0701 - 02/08 0700 In: 3276.9 [P.O.:600; I.V.:2566.9; Blood:60; IV Piggyback:50] Out: 500 [Urine:500] Intake/Output this shift:    Lab Results:  Recent Labs  09/16/14 1351 09/16/14 2020 09/17/14 0440  WBC 7.7 8.3 7.1  HGB 10.7* 11.0* 10.8*  HCT 31.6* 32.3* 32.2*  PLT 100* 112* 73*   BMET  Recent Labs  09/16/14 0020 09/16/14 0741 09/17/14 0440  NA 137 135 137  K 3.3* 3.9 3.3*  CL 102 106 107  CO2 26 24 21   GLUCOSE 122* 120* 85  BUN 80* 90* 89*  CREATININE 2.15* 2.34* 2.64*  CALCIUM 8.4 8.4 9.1   LFT  Recent Labs  09/16/14 0020 09/16/14 0741 09/17/14 0440  PROT 5.4* 4.9* 5.6*  ALBUMIN 2.7* 2.5* 2.8*  AST 37 36 53*  ALT 14 14 17   ALKPHOS 57 48 54  BILITOT 2.6* 3.7* 3.3*   PT/INR  Recent Labs  09/16/14 0020  LABPROT 18.0*  INR 1.47    Assessment: 39 year old female with history of NASH cirrhosis, presenting with hematemesis and melena, Hgb 6.5 on admission with total of 4 units PRBCs thus far. EGD 2/7 at bedside with 2 small ulcers at GE junction felt to be source of GI bleeding, single short column of  varix not large enough to be banded. Evaluated at [facility] for transplant consideration early Jan, last visit Feb 1st, recommended to establish care with substance abuse counselor due to urine drug screen positive for cannabis. No further overt GI bleeding. Currently not on a non-selective beta blocker, although she has been on propanolol 20 mg BID in the past. Per [facility] notes Feb 1, not thought to be able to tolerate small dose of non-selective beta blocker. Has undergone serial EGDs for banding to obliteration. Hgb remaining stable.   Plan: Continue PPI infusion Continue Octreotide for an additional 24 hours with tapering planned Advance diet as tolerated to soft diet Carafate QID Dialysis planned for 2/9 Complete avoidance of illicit substances Follow-up with [facility] as planned Follow-up with Dr. Oneida Alar as outpatient  Orvil Feil, ANP-BC [facility] Gastroenterology     LOS: 2 days    09/17/2014, 8:02 AM    Attending note:  Patient seen and examined in the ICU. Agree with assessment and recommendations.

## 2014-09-18 ENCOUNTER — Ambulatory Visit (HOSPITAL_COMMUNITY): Admission: RE | Admit: 2014-09-18 | Payer: Medicare Other | Source: Ambulatory Visit | Admitting: Gastroenterology

## 2014-09-18 DIAGNOSIS — I9589 Other hypotension: Secondary | ICD-10-CM

## 2014-09-18 LAB — COMPREHENSIVE METABOLIC PANEL
ALT: 16 U/L (ref 0–35)
AST: 45 U/L — ABNORMAL HIGH (ref 0–37)
Albumin: 2.7 g/dL — ABNORMAL LOW (ref 3.5–5.2)
Alkaline Phosphatase: 57 U/L (ref 39–117)
Anion gap: 8 (ref 5–15)
BILIRUBIN TOTAL: 2.3 mg/dL — AB (ref 0.3–1.2)
BUN: 75 mg/dL — ABNORMAL HIGH (ref 6–23)
CALCIUM: 8.8 mg/dL (ref 8.4–10.5)
CHLORIDE: 110 mmol/L (ref 96–112)
CO2: 19 mmol/L (ref 19–32)
Creatinine, Ser: 2.5 mg/dL — ABNORMAL HIGH (ref 0.50–1.10)
GFR, EST AFRICAN AMERICAN: 27 mL/min — AB (ref >90)
GFR, EST NON AFRICAN AMERICAN: 23 mL/min — AB (ref >90)
GLUCOSE: 98 mg/dL (ref 70–99)
Potassium: 3.5 mmol/L (ref 3.5–5.1)
Sodium: 137 mmol/L (ref 135–145)
Total Protein: 5.2 g/dL — ABNORMAL LOW (ref 6.0–8.3)

## 2014-09-18 LAB — CBC
HCT: 31.1 % — ABNORMAL LOW (ref 36.0–46.0)
Hemoglobin: 10.4 g/dL — ABNORMAL LOW (ref 12.0–15.0)
MCH: 31.4 pg (ref 26.0–34.0)
MCHC: 33.4 g/dL (ref 30.0–36.0)
MCV: 94 fL (ref 78.0–100.0)
Platelets: 115 10*3/uL — ABNORMAL LOW (ref 150–400)
RBC: 3.31 MIL/uL — AB (ref 3.87–5.11)
RDW: 22.3 % — AB (ref 11.5–15.5)
WBC: 5.8 10*3/uL (ref 4.0–10.5)

## 2014-09-18 LAB — MAGNESIUM: Magnesium: 1.6 mg/dL (ref 1.5–2.5)

## 2014-09-18 LAB — AMMONIA: Ammonia: 60 umol/L — ABNORMAL HIGH (ref 11–32)

## 2014-09-18 LAB — HEPATITIS B SURFACE ANTIGEN: Hepatitis B Surface Ag: NEGATIVE

## 2014-09-18 SURGERY — ESOPHAGOGASTRODUODENOSCOPY (EGD) WITH PROPOFOL
Anesthesia: Monitor Anesthesia Care

## 2014-09-18 MED ORDER — ALTEPLASE 2 MG IJ SOLR
2.0000 mg | Freq: Once | INTRAMUSCULAR | Status: AC | PRN
Start: 2014-09-18 — End: 2014-09-18
  Filled 2014-09-18: qty 2

## 2014-09-18 MED ORDER — OCTREOTIDE ACETATE 100 MCG/ML IJ SOLN
INTRAMUSCULAR | Status: AC
  Filled 2014-09-18: qty 1

## 2014-09-18 MED ORDER — SODIUM CHLORIDE 0.9 % IV SOLN: 100.0000 mL | INTRAVENOUS | Status: DC | PRN

## 2014-09-18 MED ORDER — PANTOPRAZOLE SODIUM 40 MG PO TBEC
40.0000 mg | DELAYED_RELEASE_TABLET | Freq: Two times a day (BID) | ORAL | Status: DC
  Administered 2014-09-18 – 2014-09-19 (×3): 40 mg via ORAL
  Filled 2014-09-18 (×3): qty 1

## 2014-09-18 MED ORDER — LACTULOSE 10 GM/15ML PO SOLN
10.0000 g | Freq: Two times a day (BID) | ORAL | Status: DC
  Administered 2014-09-18: 10 g via ORAL
  Filled 2014-09-18 (×3): qty 30

## 2014-09-18 MED ORDER — NEPRO/CARBSTEADY PO LIQD: 237.0000 mL | ORAL | Status: DC | PRN

## 2014-09-18 MED ORDER — LIDOCAINE-PRILOCAINE 2.5-2.5 % EX CREA
1.0000 "application " | TOPICAL_CREAM | CUTANEOUS | Status: DC | PRN
  Filled 2014-09-18: qty 5

## 2014-09-18 MED ORDER — PENTAFLUOROPROP-TETRAFLUOROETH EX AERO
1.0000 "application " | INHALATION_SPRAY | CUTANEOUS | Status: DC | PRN
  Filled 2014-09-18: qty 30

## 2014-09-18 MED ORDER — LIDOCAINE HCL (PF) 1 % IJ SOLN: 5.0000 mL | INTRAMUSCULAR | Status: DC | PRN

## 2014-09-18 MED ORDER — HEPARIN SODIUM (PORCINE) 1000 UNIT/ML DIALYSIS
1000.0000 [IU] | INTRAMUSCULAR | Status: DC | PRN
  Filled 2014-09-18: qty 1

## 2014-09-18 MED ORDER — OCTREOTIDE ACETATE 100 MCG/ML IJ SOLN
25.0000 ug | Freq: Four times a day (QID) | INTRAMUSCULAR | Status: AC
  Administered 2014-09-18 – 2014-09-19 (×4): 25 ug via SUBCUTANEOUS
  Filled 2014-09-18 (×4): qty 0.3

## 2014-09-18 NOTE — Progress Notes (Signed)
[facility] PROGRESS NOTE  Wanda [name] TIW:[id number] DOB: [birth year] DOA: 09/15/2014 PCP: [name]    Code Status: Full code Family Communication: Discussed with patient; she does not wish that the medical staff discuss her medical condition with her son. Disposition Plan: Discharged to home in clinically appropriate; in a couple of days.   Consultants:  Gastroenterology  nephrology  Procedures: 1.)  09/16/14-EGD by Dr. Laural Golden: Single short column of varix proximal to GE junction not large enough to be banded. Two small ulcers at GEJ felt to be source of GI bleeding but no active bleeding noted. No therapy rendered. Portal gastropathy. No evidence of peptic ulcer disease or gastric varices.  2.) Hemodialysis   Antibiotics:   Rocephin 1 on 09/16/14.  HPI/Subjective: The patient has less abdominal pain. Oxycodone is helping. She denies nausea, vomiting, or hematemesis. She wants to try solid foods.   Objective: Filed Vitals:   09/18/14 0900  BP: 102/63  Pulse: 80  Temp:   Resp: 15   temperature 97.7. Oxygen saturation 100% on room air.   Intake/Output Summary (Last 24 hours) at 09/18/14 0932 Last data filed at 09/18/14 0800  Gross per 24 hour  Intake   4073 ml  Output   1275 ml  Net   2798 ml   Filed Weights   09/17/14 0500 09/18/14 0430 09/18/14 0700  Weight: 53.3 kg (117 lb 8.1 oz) 54.658 kg (120 lb 8 oz) 54.25 kg (119 lb 9.6 oz)    Exam:   General:  ill-appearing 39 year old woman laying in bed, in no acute distress. Generalized jaundice.  HEENT: Scleral icterus   Cardiovascular: S1, S2, no murmurs rubs or gallops.   Respiratory: clear anteriorly with decreased breath sounds in the bases.   Abdomen: positive bowel sounds, soft, mild to moderate ascites-not tense; mildly diffusely tender.   Musculoskeletal/extremities: No acute hot red joints;trace to 1+ bilateral lower extremity edema.   Neurologic/psychiatric: She has  a very flat affect. She does not provide consistent eye contact. She is alert and oriented 3. Cranial nerves II through XII are intact.   Data Reviewed: Basic Metabolic Panel:  Recent Labs Lab 09/16/14 0020 09/16/14 0741 09/17/14 0440 09/18/14 0510  NA 137 135 137 137  K 3.3* 3.9 3.3* 3.5  CL 102 106 107 110  CO2 26 24 21 19   GLUCOSE 122* 120* 85 98  BUN 80* 90* 89* 75*  CREATININE 2.15* 2.34* 2.64* 2.50*  CALCIUM 8.4 8.4 9.1 8.8  MG  --   --   --  1.6   Liver Function Tests:  Recent Labs Lab 09/16/14 0020 09/16/14 0741 09/17/14 0440 09/18/14 0510  AST 37 36 53* 45*  ALT 14 14 17 16   ALKPHOS 57 48 54 57  BILITOT 2.6* 3.7* 3.3* 2.3*  PROT 5.4* 4.9* 5.6* 5.2*  ALBUMIN 2.7* 2.5* 2.8* 2.7*   No results for input(s): LIPASE, AMYLASE in the last 168 hours.  Recent Labs Lab 09/16/14 0020 09/18/14 0510  AMMONIA 53* 60*   CBC:  Recent Labs Lab 09/16/14 0020  09/16/14 1351 09/16/14 2020 09/17/14 0440 09/17/14 0907 09/18/14 0510  WBC 8.4  < > 7.7 8.3 7.1 5.8 5.8  NEUTROABS 5.2  --   --   --   --   --   --   HGB 6.5*  < > 10.7* 11.0* 10.8* 10.2* 10.4*  HCT 19.7*  < > 31.6* 32.3* 32.2* 30.6* 31.1*  MCV 105.3*  < > 91.6  90.7 92.0 92.4 94.0  PLT 126*  < > 100* 112* 73* 104* 115*  < > = values in this interval not displayed. Cardiac Enzymes: No results for input(s): CKTOTAL, CKMB, CKMBINDEX, TROPONINI in the last 168 hours. BNP (last 3 results) No results for input(s): BNP in the last 8760 hours.  ProBNP (last 3 results) No results for input(s): PROBNP in the last 8760 hours.  CBG: No results for input(s): GLUCAP in the last 168 hours.  Recent Results (from the past 240 hour(s))  MRSA PCR Screening     Status: None   Collection Time: 09/16/14  2:39 AM  Result Value Ref Range Status   MRSA by PCR NEGATIVE NEGATIVE Final    Comment:        The GeneXpert MRSA Assay (FDA approved for NASAL specimens only), is one component of a comprehensive MRSA  colonization surveillance program. It is not intended to diagnose MRSA infection nor to guide or monitor treatment for MRSA infections.      Studies: No results found.  Scheduled Meds: . antiseptic oral rinse  7 mL Mouth Rinse BID  . sodium chloride  3 mL Intravenous Q12H  . sucralfate  1 g Oral TID WC & HS   Continuous Infusions: . sodium chloride 20 mL/hr at 09/16/14 1735  . octreotide  (SANDOSTATIN)    IV infusion 50 mcg/hr (09/18/14 0530)  . pantoprozole (PROTONIX) infusion 8 mg/hr (09/18/14 0015)   Assessment plan:  Principal Problem:   Hematemesis/vomiting blood Active Problems:   Liver cirrhosis secondary to nonalcoholic steatohepatitis (NASH)   Esophageal varices   Anemia associated with acute blood loss   Gastroesophageal junction ulcer   Portal hypertensive gastropathy   Cirrhosis of liver with ascites   Malnutrition of moderate degree   Thrombocytopenia   ESRD (end stage renal disease) on dialysis   Chronic hypotension    1.Hematemesis/upper GI bleeding. Per EGD by Dr. Laural Golden, he believed the bleeding was secondary to the GE junction ulcer. We'll continue octreotide and Protonix drip as ordered. GI added sucralfate. Bleeding has resolved. Continue when necessary Cepacol for throat pain. Continue supportive treatment.  Diet will be advanced to full liquids thin soft as tolerated.  Acute blood loss anemia. Patient's hemoglobin was 6.5 on admission. She was transfused 2 units of packed red blood cells. Her hemoglobin has improved and been stable thus far.  NASH cirrhosis. This appears to be very severe. She has associated hyperbilirubinemia, splenomegaly, esophageal varices, portal hypertension gastropathy, chronic thrombocytopenia, and coagulopathy. Patient was thought not to be able to tolerate a small dose of nonselective beta blockers secondary to chronic hypotension per GI. Her ammonia level is modestly elevated. She showed no signs of hepatic  encephalopathy, but will add low dosing lactulose-discussed with GI NP Ms. Sams. Further management per GI. The patient is to follow-up at [facility] as recommended by Dr. Oneida Alar.  Ascites secondary to cirrhosis. Small amount of ascites per exam. The patient underwent a paracentesis on 09/14/14 prior to the hospitalization. It yielded 4.7 L of fluid.  Chronic hypotension. Status post gentle IV fluids and packed red blood cell transfusions. Her blood pressure is better, but is chronically low.  Recent diagnosis of end-stage renal disease. Nephrology has been consulted for hemodialysis.   Will transfer to a medical bed. Will encourage increase in activity as tolerated.    Time spent: 30 minutes.     [facility] Pager [phone] . If 7PM-7AM, please contact night-coverage at [email or web address], password  [email or web address] 09/18/2014, 9:32 AM  LOS: 3 days

## 2014-09-18 NOTE — Progress Notes (Signed)
Patient arrived to floor from ICU via wheelchair. Alert and oriented. No complaints voiced at this time.

## 2014-09-18 NOTE — Progress Notes (Signed)
Wanda [name]  MRN: [id number]  DOB/AGE: 10/13/1975 39 y.o.  Primary Care [place] date: 09/15/2014  Chief Complaint:  Chief Complaint  Patient presents with  . Hematemesis    S-Pt presented on  09/15/2014 with  Chief Complaint  Patient presents with  . Hematemesis  .     Pt offers no new complaints.    Pt seen on HD    Pt tolerating tx well.  Meds . antiseptic oral rinse  7 mL Mouth Rinse BID  . sodium chloride  3 mL Intravenous Q12H  . sucralfate  1 g Oral TID WC & HS     Physical Exam: Vital signs in last 24 hours: Temp:  [97.4 F (36.3 C)-97.7 F (36.5 C)] 97.7 F (36.5 C) (02/09 0700) Pulse Rate:  [51-82] 79 (02/09 0830) Resp:  [11-19] 18 (02/09 0830) BP: (75-114)/(37-68) 114/52 mmHg (02/09 0830) SpO2:  [97 %-100 %] 100 % (02/09 0700) Weight:  [119 lb 9.6 oz (54.25 kg)-120 lb 8 oz (54.658 kg)] 119 lb 9.6 oz (54.25 kg) (02/09 0700) Weight change: 2 lb 15.9 oz (1.359 kg) Last BM Date: 09/17/14  Intake/Output from previous day: 02/08 0701 - 02/09 0700 In: 3713 [P.O.:1440; I.V.:1975; IV Piggyback:300] Out: 1275 [Urine:1275] Total I/O In: 360 [P.O.:360] Out: -    Physical Exam: General- pt is awake,alert, oriented to time place and person Resp- No acute REsp distress,NO Rhonchi, PC in situ CVS- S1S2 regular in rate and rhythm GIT- BS+, soft, distended+  EXT- NO LE Edema,NO Cyanosis   Lab Results: CBC  Recent Labs  09/17/14 0907 09/18/14 0510  WBC 5.8 5.8  HGB 10.2* 10.4*  HCT 30.6* 31.1*  PLT 104* 115*    BMET  Recent Labs  09/17/14 0440 09/18/14 0510  NA 137 137  K 3.3* 3.5  CL 107 110  CO2 21 19  GLUCOSE 85 98  BUN 89* 75*  CREATININE 2.64* 2.50*  CALCIUM 9.1 8.8   Trend Creat 2016  4.89=>4.53=>5.529 (Initiated on HD 08/18/14 sec to Anasarca) 2015  2.15=>4.13=>1.68=>1.59=>2.12( SEpt admission)           1.15=>1.95=>1.19 ( July admission)           2.3==>1.21=> 1.08 ( April admission)       3.11==>1.36==>1.18 ( February admission)  MICRO Recent Results (from the past 240 hour(s))  MRSA PCR Screening     Status: None   Collection Time: 09/16/14  2:39 AM  Result Value Ref Range Status   MRSA by PCR NEGATIVE NEGATIVE Final    Comment:        The GeneXpert MRSA Assay (FDA approved for NASAL specimens only), is one component of a comprehensive MRSA colonization surveillance program. It is not intended to diagnose MRSA infection nor to guide or monitor treatment for MRSA infections.       Lab Results  Component Value Date   CALCIUM 8.8 09/18/2014   PHOS 3.6 08/28/2014       Impression: 1)Renal  ESRD on HD since   08/18/14                CKD sec to Multiple AKI                CKD since 2015                On TTS schedule as outpt                Pt being  dialyzed today              2)CVS- Hemodynamically stable  3)Anemia HGb  Stable Admitted with GI bleedstable  4)Liver- Cirrhosis Sec to Steato hepatitis   5)Electrolytes Hypokalemia    better    Will use 4 k bath for HD    6)GI- admitted with GI bleeding s/p EGD showing Variceal bleeding  on IV PPI    7)Acid base Co2 at goal  8) Thrombocytopenia-Stable Primary MD following  Plan:  Will continue current tx;     BHUTANI,MANPREET S 09/18/2014, 8:58 AM

## 2014-09-18 NOTE — Progress Notes (Signed)
Pt transferring to room 202 as med/surg. No vomiting or stools. Dialysis completed for the today. sandastatin iv continues at 61mcg/hr and protonix at 9mg /hr. Pt alert and oriented. Transfer  Report given to jennifer coffey rn on 200.

## 2014-09-18 NOTE — Progress Notes (Signed)
Patient tolerated a ice cream for a bedtime snack.  Patient wants to be advance to a Full liquid diet.

## 2014-09-18 NOTE — Progress Notes (Signed)
    Subjective: Patient notes vague right-sided discomfort. No N/V. No overt GI bleeding. Wants to advance diet.   Objective: Vital signs in last 24 hours: Temp:  [97.4 F (36.3 C)-97.7 F (36.5 C)] 97.7 F (36.5 C) (02/09 0700) Pulse Rate:  [51-82] 61 (02/09 0800) Resp:  [11-19] 13 (02/09 0800) BP: (75-104)/(37-68) 92/49 mmHg (02/09 0800) SpO2:  [97 %-100 %] 100 % (02/09 0700) Weight:  [119 lb 9.6 oz (54.25 kg)-120 lb 8 oz (54.658 kg)] 119 lb 9.6 oz (54.25 kg) (02/09 0700) Last BM Date: 09/17/14 General:   Alert and oriented, appears chronically ill.  Head:  Normocephalic and atraumatic. Eyes:  Mild scleral icterus Abdomen:  Bowel sounds present, full but without tense ascites, umbilical hernia easily reducible, mild discomfort RLQ/RUQ Msk:  Symmetrical without gross deformities. Normal posture. Extremities:  Without edema. Neurologic:  Alert and  oriented x4 Psych: flat affect   Intake/Output from previous day: 02/08 0701 - 02/09 0700 In: 3713 [P.O.:1440; I.V.:1973; IV Piggyback:300] Out: 1275 [Urine:1275] Intake/Output this shift:    Lab Results:  Recent Labs  09/17/14 0440 09/17/14 0907 09/18/14 0510  WBC 7.1 5.8 5.8  HGB 10.8* 10.2* 10.4*  HCT 32.2* 30.6* 31.1*  PLT 73* 104* 115*   BMET  Recent Labs  09/16/14 0741 09/17/14 0440 09/18/14 0510  NA 135 137 137  K 3.9 3.3* 3.5  CL 106 107 110  CO2 24 21 19   GLUCOSE 120* 85 98  BUN 90* 89* 75*  CREATININE 2.34* 2.64* 2.50*  CALCIUM 8.4 9.1 8.8   LFT  Recent Labs  09/16/14 0741 09/17/14 0440 09/18/14 0510  PROT 4.9* 5.6* 5.2*  ALBUMIN 2.5* 2.8* 2.7*  AST 36 53* 45*  ALT 14 17 16   ALKPHOS 48 54 57  BILITOT 3.7* 3.3* 2.3*   PT/INR  Recent Labs  09/16/14 0020  LABPROT 18.0*  INR 1.47    Assessment: 39 year old female with history of NASH cirrhosis, presenting with hematemesis and melena, Hgb 6.5 on admission with total of 4 units PRBCs thus far. EGD 2/7 at bedside with 2 small ulcers at  GE junction felt to be source of GI bleeding, single short column of varix not large enough to be banded. Evaluated at [facility] for transplant consideration early Jan, last visit Feb 1st, recommended to establish care with substance abuse counselor due to urine drug screen positive for cannabis. No further overt GI bleeding. Currently not on a non-selective beta blocker, although she has been on propanolol 20 mg BID in the past. Per [facility] notes Feb 1, not thought to be able to tolerate small dose of non-selective beta blocker. Has undergone serial EGDs for banding to obliteration. Hgb remaining stable.     Plan: Change PPI to BID, stop infusion Will start tapering octreotide today (25 mcg subcutaneous every 6 hours X 24 hours) Advance diet as tolerated Carafate QID Dialysis today in process Follow-up with [facility] as planned Follow-up with Dr. Oneida Alar as outpatient  Orvil Feil, ANP-BC [facility] Gastroenterology '    LOS: 3 days    09/18/2014, 8:09 AM

## 2014-09-18 NOTE — Care Management Note (Addendum)
    Page 1 of 1   09/19/2014     3:31:08 PM CARE MANAGEMENT NOTE 09/19/2014  Patient:  Wanda [name], Wanda [name]   Account Number:  [id number]  Date Initiated:  09/18/2014  Documentation initiated by:  Jolene Provost  Subjective/Objective Assessment:   Pt is from home, lives with fiance, and independent at baseline. Pt has walker but has no other DME's med needs or [place] services prior to admission. Pt plans to discharge home with self care. No CM needs anticipated at this time.     Action/Plan:   Pt will need follow up appointment at [facility] prior to discharge. Will continue to follow.   Anticipated DC Date:  09/20/2014   Anticipated DC Plan:  [place]  CM consult      Choice offered to / List presented to:             Status of service:  Completed, signed off Medicare Important Message given?  YES (If response is "NO", the following Medicare IM given date fields will be blank) Date Medicare IM given:  09/19/2014 Medicare IM given by:  Vladimir Creeks Date Additional Medicare IM given:   Additional Medicare IM given by:    Discharge Disposition:  HOME/SELF CARE  Per UR Regulation:  Reviewed for med. necessity/level of care/duration of stay  If discussed at [place] of Stay Meetings, dates discussed:    Comments:  09/19/14 [place] RN/CM  Pt being D/C home to follow up at [facility] 09/18/2014 [place], RN, MSN, CM

## 2014-09-18 NOTE — Procedures (Signed)
   HEMODIALYSIS TREATMENT NOTE:  3.5 hour heparin-free dialysis completed via right IJ tunneled catheter.  Exit site unremarkable.  Goal met:  2.4 liters removed without interruption in ultrafiltration.  SBP 90s-100s. All blood reinfused.  Report given to Terence Lux, RN.  [place], RN, CDN

## 2014-09-18 NOTE — Care Management Utilization Note (Signed)
UR completed 

## 2014-09-19 ENCOUNTER — Encounter (HOSPITAL_COMMUNITY): Payer: Self-pay | Admitting: Internal Medicine

## 2014-09-19 DIAGNOSIS — N186 End stage renal disease: Secondary | ICD-10-CM

## 2014-09-19 DIAGNOSIS — D696 Thrombocytopenia, unspecified: Secondary | ICD-10-CM

## 2014-09-19 DIAGNOSIS — K253 Acute gastric ulcer without hemorrhage or perforation: Secondary | ICD-10-CM

## 2014-09-19 LAB — COMPREHENSIVE METABOLIC PANEL
ALT: 15 U/L (ref 0–35)
AST: 48 U/L — ABNORMAL HIGH (ref 0–37)
Albumin: 2.9 g/dL — ABNORMAL LOW (ref 3.5–5.2)
Alkaline Phosphatase: 59 U/L (ref 39–117)
Anion gap: 11 (ref 5–15)
BILIRUBIN TOTAL: 2.6 mg/dL — AB (ref 0.3–1.2)
BUN: 29 mg/dL — ABNORMAL HIGH (ref 6–23)
CALCIUM: 8.8 mg/dL (ref 8.4–10.5)
CO2: 24 mmol/L (ref 19–32)
CREATININE: 1.97 mg/dL — AB (ref 0.50–1.10)
Chloride: 102 mmol/L (ref 96–112)
GFR calc non Af Amer: 31 mL/min — ABNORMAL LOW (ref >90)
GFR, EST AFRICAN AMERICAN: 35 mL/min — AB (ref >90)
GLUCOSE: 107 mg/dL — AB (ref 70–99)
Potassium: 3.9 mmol/L (ref 3.5–5.1)
Sodium: 137 mmol/L (ref 135–145)
Total Protein: 5.5 g/dL — ABNORMAL LOW (ref 6.0–8.3)

## 2014-09-19 LAB — CBC
HCT: 32.3 % — ABNORMAL LOW (ref 36.0–46.0)
Hemoglobin: 10.5 g/dL — ABNORMAL LOW (ref 12.0–15.0)
MCH: 31.4 pg (ref 26.0–34.0)
MCHC: 32.5 g/dL (ref 30.0–36.0)
MCV: 96.7 fL (ref 78.0–100.0)
Platelets: 82 10*3/uL — ABNORMAL LOW (ref 150–400)
RBC: 3.34 MIL/uL — AB (ref 3.87–5.11)
RDW: 22.9 % — ABNORMAL HIGH (ref 11.5–15.5)
WBC: 6.3 10*3/uL (ref 4.0–10.5)

## 2014-09-19 MED ORDER — PANTOPRAZOLE SODIUM 40 MG PO TBEC: 40.0000 mg | DELAYED_RELEASE_TABLET | Freq: Two times a day (BID) | ORAL | Status: DC

## 2014-09-19 MED ORDER — LACTULOSE 10 GM/15ML PO SOLN: 10.0000 g | Freq: Two times a day (BID) | ORAL | Status: DC

## 2014-09-19 NOTE — Progress Notes (Signed)
Late Entry: Iv's removed and sites within normal limits. AVS reviewed with patient, verbalized understanding of discharge instructions, medications, and physician follow ups. Prescriptions given to patient. Patient transported to main entrance by nurse tech. Stable at time of discharge.

## 2014-09-19 NOTE — Care Management Utilization Note (Signed)
UR completed 

## 2014-09-19 NOTE — Discharge Summary (Signed)
Physician Discharge Summary  VITALIA STOUGH EHM:[id number] DOB: [birth year] DOA: 09/15/2014  PCP: [place] date: 09/15/2014 Discharge date: 09/19/2014  Time spent: 35 minutes  Recommendations for Outpatient Follow-up:  1. Follow up with [facility] for further evaluation for transplant on 2/25 2. Follow up with [facility] in 8-10 weeks for repeat EGD 3. Follow up with primary care physician in 1-2 weeks  Discharge Diagnoses:  Principal Problem:   Hematemesis/vomiting blood Active Problems:   Liver cirrhosis secondary to nonalcoholic steatohepatitis (NASH)   Malnutrition of moderate degree   Esophageal varices   Portal hypertensive gastropathy   Thrombocytopenia   ESRD (end stage renal disease) on dialysis   Anemia associated with acute blood loss   Cirrhosis of liver with ascites   Chronic hypotension   Gastroesophageal junction ulcer   Discharge Condition: improved  Diet recommendation: low salt  Filed Weights   09/17/14 0500 09/18/14 0430 09/18/14 0700  Weight: 53.3 kg (117 lb 8.1 oz) 54.658 kg (120 lb 8 oz) 54.25 kg (119 lb 9.6 oz)    History of present illness:  This patient was admitted to the hospital with hematemesis. She has known [name] cirrhosis with esophageal varices. She is being evaluated at [facility] for possible liver transplant. She was found to have a significant acute blood loss anemia and GI bleeding. She was admitted to the hospital for further treatments.  Hospital Course:  Patient was transfused 2 units PRBCs and FFP on admission. She was started on Protonix and octreotide infusion. She underwent EGD with results as below. She was started on Carafate as well as proton pump inhibitors. As her hospital course progressed, her clinical condition improved. Carafate has since been discontinued. Recommendations are for her to continue twice a day PPI for now. She will need repeat EGD in 8-10 weeks to reevaluate  her GE junction.  Patient has chronic hypotension. We'll continue her on midodrine. This is likely related to underlying cirrhosis  Patient is due to follow-up with [facility] to further evaluate for possible liver transplant.  She was continued on dialysis per nephrology schedule.  At this time, her bleeding has appeared to have resolved her hemoglobin stable. She's had a normal bowel movement today. Discussed with GI and it was felt appropriate to discharge patient home today and have outpatient follow-up.  Procedures: 1.) 09/16/14-EGD by Dr. Laural Golden: Single short column of varix proximal to GE junction not large enough to be banded. Two small ulcers at GEJ felt to be source of GI bleeding but no active bleeding noted. No therapy rendered. Portal gastropathy. No evidence of peptic ulcer disease or gastric varices.  2.) Hemodialysis  Consultations:  GI  Nephrology  Discharge Exam: Filed Vitals:   09/19/14 1510  BP: 87/46  Pulse: 69  Temp: 97.9 F (36.6 C)  Resp: 18    General: NAD Cardiovascular: S1, S2 RRR Respiratory: CTA B  Discharge Instructions   Discharge Instructions    Diet - low sodium heart healthy    Complete by:  As directed      Increase activity slowly    Complete by:  As directed           Discharge Medication List as of 09/19/2014  3:54 PM    START taking these medications   Details  lactulose (CHRONULAC) 10 GM/15ML solution Take 15 mLs (10 g total) by mouth 2 (two) times daily., Starting 09/19/2014, Until Discontinued, Print  CONTINUE these medications which have CHANGED   Details  pantoprazole (PROTONIX) 40 MG tablet Take 1 tablet (40 mg total) by mouth 2 (two) times daily., Starting 09/19/2014, Until Discontinued, Print      CONTINUE these medications which have NOT CHANGED   Details  folic acid (FOLVITE) 1 MG tablet Take 1 tablet (1 mg total) by mouth daily., Starting 08/28/2014, Until Discontinued, Normal    loperamide  (IMODIUM) 2 MG capsule Take 1 capsule (2 mg total) by mouth 2 (two) times daily as needed for diarrhea or loose stools., Starting 08/28/2014, Until Discontinued, Print    LORazepam (ATIVAN) 1 MG tablet Take 1 tablet (1 mg total) by mouth every 12 (twelve) hours as needed for anxiety., Starting 08/30/2014, Until Discontinued, Print    metolazone (ZAROXOLYN) 5 MG tablet Take 10 mg by mouth daily. , Until Discontinued, Historical Med    midodrine (PROAMATINE) 10 MG tablet Take 1 tablet (10 mg total) by mouth 2 (two) times daily with a meal. Medication to keep your blood pressure from falling., Starting 04/27/2014, Until Discontinued, Print    Oxycodone HCl 10 MG TABS Take 1 tablet (10 mg total) by mouth every 6 (six) hours as needed., Starting 09/05/2014, Until Discontinued, Print    promethazine (PHENERGAN) 12.5 MG tablet Take 1 tablet (12.5 mg total) by mouth every 6 (six) hours as needed for nausea or vomiting., Starting 09/05/2014, Until Discontinued, Normal    lidocaine (XYLOCAINE) 2 % solution 2 TSP  PO QAC AND HS TO PREVENT FOR CHEST PAIN WHILE EATING, Normal    magnesium oxide (MAG-OX) 400 (241.3 MG) MG tablet Take 1 tablet (400 mg total) by mouth daily., Starting 04/27/2014, Until Discontinued, Print    Nutritional Supplements (FEEDING SUPPLEMENT, NEPRO CARB STEADY,) LIQD Take 237 mLs by mouth as needed (missed meal during dialysis.)., Starting 08/28/2014, Until Discontinued, No Print       Allergies  Allergen Reactions  . Lasix [Furosemide]     "doesn't work"  . Morphine And Related Hives and Itching   Follow-up Information    Follow up with [facility]plant clinic On 10/04/2014.      Follow up with [name] In 2 weeks.   Contact information:   [place] [address] [phone]       Follow up with Manus Rudd, MD. Schedule an appointment as soon as possible for a visit in 2 months.   Specialty:  Gastroenterology   Contact information:    [place] [address] [phone]       Follow up with follow up with Dialysis center tomorrow as scheduled.       The results of significant diagnostics from this hospitalization (including imaging, microbiology, ancillary and laboratory) are listed below for reference.    Significant Diagnostic Studies: US Paracentesis  09/14/2014   CLINICAL DATA:  Ascites  EXAM: ULTRASOUND GUIDED  PARACENTESIS  COMPARISON:  None.  PROCEDURE: An ultrasound guided paracentesis was thoroughly discussed with the patient and questions answered. The benefits, risks, alternatives and complications were also discussed. The patient understands and wishes to proceed with the procedure. Written consent was obtained.  Ultrasound was performed to localize and mark an adequate pocket of fluid in the right lower quadrant of the abdomen. The area was then prepped and draped in the normal sterile fashion. 1% Lidocaine was used for local anesthesia. Under ultrasound guidance a 19 gauge Yueh catheter was introduced. Paracentesis was performed. The catheter was removed  and a dressing applied.  Complications: None.  FINDINGS: A total of approximately 4.7 L of clear yellow fluid was removed. A fluid sample was not sent for laboratory analysis.  IMPRESSION: Successful ultrasound guided paracentesis yielding 4.7 L of ascites.   Electronically Signed   By: Rolm Baptise M.D.   On: 09/14/2014 13:08   US Paracentesis  08/28/2014   CLINICAL DATA:  Cirrhosis secondary to NASH, recurrent ascites  EXAM: ULTRASOUND GUIDED THERAPEUTIC PARACENTESIS  COMPARISON:  08/16/2014  PROCEDURE: Procedure, benefits, and risks of procedure were discussed with patient.  Written informed consent for procedure was obtained.  Time out protocol followed.  Adequate collection of ascites localized by ultrasound in RIGHT lower quadrant.  Skin prepped and draped in usual sterile fashion.  Skin and soft tissues anesthetized with 10 mL of 1%  lidocaine.  5 [place] Yueh catheter placed into peritoneal cavity.  2400 mL of yellow fluid aspirated by vacuum bottle suction.  Procedure tolerated well by patient without immediate complication.  FINDINGS: As above  IMPRESSION: Successful ultrasound guided paracentesis yielding 2400 mL of ascites.   Electronically Signed   By: Lavonia Dana M.D.   On: 08/28/2014 12:02   Ct Biopsy  08/27/2014   CLINICAL DATA:  Unexplained thrombocytopenia. The patient requires a bone marrow biopsy.  EXAM: CT GUIDED ASPIRATE AND CORE RIGHT ILIAC BONE MARROW BIOPSY  ANESTHESIA/SEDATION: Versed 2.0 mg IV, Fentanyl 100 mcg IV  Total Moderate Sedation Time  7 minutes.  PROCEDURE: The procedure risks, benefits, and alternatives were explained to the patient. Questions regarding the procedure were encouraged and answered. The patient understands and consents to the procedure.  The right gluteal region was prepped with Betadine. Sterile gown and sterile gloves were used for the procedure. Local anesthesia was provided with 1% Lidocaine. A time-out was performed prior to the procedure.  Under CT guidance, an 11 gauge OnControl bone cutting needle was advanced from a posterior approach into the right iliac bone. Needle positioning was confirmed with CT. Initial non heparinized and heparinized aspirate samples were obtained of bone marrow. Core biopsy was performed with the outer biopsy needle.  COMPLICATIONS: None  FINDINGS: Inspection of initial aspirate did reveal visible particles. Intact core biopsy sample was obtained.  IMPRESSION: CT guided bone marrow biopsy of right posterior iliac bone with both aspirate and core samples obtained.   Electronically Signed   By: Aletta Edouard M.D.   On: 08/27/2014 13:27    Microbiology: Recent Results (from the past 240 hour(s))  MRSA PCR Screening     Status: None   Collection Time: 09/16/14  2:39 AM  Result Value Ref Range Status   MRSA by PCR NEGATIVE NEGATIVE Final    Comment:         The GeneXpert MRSA Assay (FDA approved for NASAL specimens only), is one component of a comprehensive MRSA colonization surveillance program. It is not intended to diagnose MRSA infection nor to guide or monitor treatment for MRSA infections.      Labs: Basic Metabolic Panel:  Recent Labs Lab 09/16/14 0020 09/16/14 0741 09/17/14 0440 09/18/14 0510 09/19/14 0636  NA 137 135 137 137 137  K 3.3* 3.9 3.3* 3.5 3.9  CL 102 106 107 110 102  CO2 26 24 21 19 24   GLUCOSE 122* 120* 85 98 107*  BUN 80* 90* 89* 75* 29*  CREATININE 2.15* 2.34* 2.64* 2.50* 1.97*  CALCIUM 8.4 8.4 9.1 8.8 8.8  MG  --   --   --  1.6  --    Liver Function Tests:  Recent Labs Lab 09/16/14 0020 09/16/14 0741 09/17/14 0440 09/18/14 0510 09/19/14 0636  AST 37 36 53* 45* 48*  ALT 14 14 17 16 15   ALKPHOS 57 48 54 57 59  BILITOT 2.6* 3.7* 3.3* 2.3* 2.6*  PROT 5.4* 4.9* 5.6* 5.2* 5.5*  ALBUMIN 2.7* 2.5* 2.8* 2.7* 2.9*   No results for input(s): LIPASE, AMYLASE in the last 168 hours.  Recent Labs Lab 09/16/14 0020 09/18/14 0510  AMMONIA 53* 60*   CBC:  Recent Labs Lab 09/16/14 0020  09/16/14 2020 09/17/14 0440 09/17/14 0907 09/18/14 0510 09/19/14 0636  WBC 8.4  < > 8.3 7.1 5.8 5.8 6.3  NEUTROABS 5.2  --   --   --   --   --   --   HGB 6.5*  < > 11.0* 10.8* 10.2* 10.4* 10.5*  HCT 19.7*  < > 32.3* 32.2* 30.6* 31.1* 32.3*  MCV 105.3*  < > 90.7 92.0 92.4 94.0 96.7  PLT 126*  < > 112* 73* 104* 115* 82*  < > = values in this interval not displayed. Cardiac Enzymes: No results for input(s): CKTOTAL, CKMB, CKMBINDEX, TROPONINI in the last 168 hours. BNP: BNP (last 3 results) No results for input(s): BNP in the last 8760 hours.  ProBNP (last 3 results) No results for input(s): PROBNP in the last 8760 hours.  CBG: No results for input(s): GLUCAP in the last 168 hours.     Signed:  Yuki Purves  [facility] 09/19/2014, 7:21 PM

## 2014-09-19 NOTE — Progress Notes (Signed)
Wanda [name]  MRN: [id number]  DOB/AGE: 1975-12-21 39 y.o.  Primary Care [place] date: 09/15/2014  Chief Complaint:  Chief Complaint  Patient presents with  . Hematemesis    S-Pt presented on  09/15/2014 with  Chief Complaint  Patient presents with  . Hematemesis  .     Pt offers no new complaints.   Meds . antiseptic oral rinse  7 mL Mouth Rinse BID  . lactulose  10 g Oral BID  . pantoprazole  40 mg Oral BID AC  . sodium chloride  3 mL Intravenous Q12H  . sucralfate  1 g Oral TID WC & HS     Physical Exam: Vital signs in last 24 hours: Temp:  [97.7 F (36.5 C)-98.6 F (37 C)] 98.6 F (37 C) (02/10 0439) Pulse Rate:  [63-95] 63 (02/10 0439) Resp:  [13-18] 18 (02/10 0439) BP: (80-117)/(44-73) 84/52 mmHg (02/10 0439) SpO2:  [99 %-100 %] 100 % (02/10 0439) Weight change:  Last BM Date: 09/17/14  Intake/Output from previous day: 02/09 0701 - 02/10 0700 In: 841.7 [P.O.:600; I.V.:241.7] Out: 2671 [Urine:200; Stool:1]     Physical Exam: General- pt is awake,alert, oriented to time place and person Resp- No acute REsp distress,NO Rhonchi, PC in situ CVS- S1S2 regular in rate and rhythm GIT- BS+, soft, distended+  EXT- NO LE Edema,NO Cyanosis   Lab Results: CBC  Recent Labs  09/18/14 0510 09/19/14 0636  WBC 5.8 6.3  HGB 10.4* 10.5*  HCT 31.1* 32.3*  PLT 115* 82*    BMET  Recent Labs  09/18/14 0510 09/19/14 0636  NA 137 137  K 3.5 3.9  CL 110 102  CO2 19 24  GLUCOSE 98 107*  BUN 75* 29*  CREATININE 2.50* 1.97*  CALCIUM 8.8 8.8   Trend Creat 2016  4.89=>4.53=>5.529 (Initiated on HD 08/18/14 sec to Anasarca) 2015  2.15=>4.13=>1.68=>1.59=>2.12( SEpt admission)           1.15=>1.95=>1.19 ( July admission)           2.3==>1.21=> 1.08 ( April admission)           3.11==>1.36==>1.18 ( February admission)  MICRO Recent Results (from the past 240 hour(s))  MRSA PCR Screening     Status: None   Collection  Time: 09/16/14  2:39 AM  Result Value Ref Range Status   MRSA by PCR NEGATIVE NEGATIVE Final    Comment:        The GeneXpert MRSA Assay (FDA approved for NASAL specimens only), is one component of a comprehensive MRSA colonization surveillance program. It is not intended to diagnose MRSA infection nor to guide or monitor treatment for MRSA infections.       Lab Results  Component Value Date   CALCIUM 8.8 09/19/2014   PHOS 3.6 08/28/2014       Impression: 1)Renal  ESRD on HD since   08/18/14                CKD sec to Multiple AKI                CKD since 2015                On TTS schedule as outpt                Pt was  dialyzed yesterday              2)CVS- Hemodynamically stable  3)Anemia HGb  Stable  Admitted with GI bleed  stable  4)Liver- Cirrhosis Sec to Steato hepatitis   5)Electrolytes Hypokalemia   now better      6)GI- admitted with GI bleeding s/p EGD showing Variceal bleeding  on  PPI    7)Acid base Co2 at goal  8) Thrombocytopenia-Stable Primary MD following  Plan:  Will continue current tx. No need of Hd today    Wanda [name] S 09/19/2014, 9:14 AM

## 2014-09-19 NOTE — Progress Notes (Addendum)
Subjective:  Patient feels hungry. Abdomen is sore. No vomiting, melena, brbpr.   Objective: Vital signs in last 24 hours: Temp:  [97.7 F (36.5 C)-98.6 F (37 C)] 98.6 F (37 C) (02/10 0439) Pulse Rate:  [63-95] 63 (02/10 0439) Resp:  [12-18] 18 (02/10 0439) BP: (80-117)/(44-73) 84/52 mmHg (02/10 0439) SpO2:  [99 %-100 %] 100 % (02/10 0439) Last BM Date: 09/17/14 General:   Alert,  Well-developed, well-nourished, pleasant and cooperative in NAD Head:  Normocephalic and atraumatic. Eyes:  Sclera clear, no icterus.   Abdomen:  Soft, mildly distended but not tense. Mild diffuse tenderness. No guarding, and without rebound.   Extremities:  Without clubbing, deformity. Trace pedal edema. Neurologic:  Alert and  oriented x4;  grossly normal neurologically. Skin:  Intact without significant lesions or rashes. Psych:  Alert and cooperative. Normal mood and affect.  Intake/Output from previous day: 02/09 0701 - 02/10 0700 In: 841.7 [P.O.:600; I.V.:241.7] Out: 2671 [Urine:200; Stool:1] Intake/Output this shift:    Lab Results: CBC  Recent Labs  09/17/14 0907 09/18/14 0510 09/19/14 0636  WBC 5.8 5.8 6.3  HGB 10.2* 10.4* 10.5*  HCT 30.6* 31.1* 32.3*  MCV 92.4 94.0 96.7  PLT 104* 115* 82*   BMET  Recent Labs  09/17/14 0440 09/18/14 0510 09/19/14 0636  NA 137 137 137  K 3.3* 3.5 3.9  CL 107 110 102  CO2 21 19 24   GLUCOSE 85 98 107*  BUN 89* 75* 29*  CREATININE 2.64* 2.50* 1.97*  CALCIUM 9.1 8.8 8.8   LFTs  Recent Labs  09/17/14 0440 09/18/14 0510 09/19/14 0636  BILITOT 3.3* 2.3* 2.6*  ALKPHOS 54 57 59  AST 53* 45* 48*  ALT 17 16 15   PROT 5.6* 5.2* 5.5*  ALBUMIN 2.8* 2.7* 2.9*   No results for input(s): LIPASE in the last 72 hours. PT/INR No results for input(s): LABPROT, INR in the last 72 hours.    Imaging Studies: US Paracentesis  09/14/2014   CLINICAL DATA:  Ascites  EXAM: ULTRASOUND GUIDED  PARACENTESIS  COMPARISON:  None.  PROCEDURE: An  ultrasound guided paracentesis was thoroughly discussed with the patient and questions answered. The benefits, risks, alternatives and complications were also discussed. The patient understands and wishes to proceed with the procedure. Written consent was obtained.  Ultrasound was performed to localize and mark an adequate pocket of fluid in the right lower quadrant of the abdomen. The area was then prepped and draped in the normal sterile fashion. 1% Lidocaine was used for local anesthesia. Under ultrasound guidance a 19 gauge Yueh catheter was introduced. Paracentesis was performed. The catheter was removed and a dressing applied.  Complications: None.  FINDINGS: A total of approximately 4.7 L of clear yellow fluid was removed. A fluid sample was not sent for laboratory analysis.  IMPRESSION: Successful ultrasound guided paracentesis yielding 4.7 L of ascites.   Electronically Signed   By: Rolm Baptise M.D.   On: 09/14/2014 13:08   US Paracentesis  08/28/2014   CLINICAL DATA:  Cirrhosis secondary to NASH, recurrent ascites  EXAM: ULTRASOUND GUIDED THERAPEUTIC PARACENTESIS  COMPARISON:  08/16/2014  PROCEDURE: Procedure, benefits, and risks of procedure were discussed with patient.  Written informed consent for procedure was obtained.  Time out protocol followed.  Adequate collection of ascites localized by ultrasound in RIGHT lower quadrant.  Skin prepped and draped in usual sterile fashion.  Skin and soft tissues anesthetized with 10 mL of 1% lidocaine.  5 [place] Yueh catheter placed into peritoneal  cavity.  2400 mL of yellow fluid aspirated by vacuum bottle suction.  Procedure tolerated well by patient without immediate complication.  FINDINGS: As above  IMPRESSION: Successful ultrasound guided paracentesis yielding 2400 mL of ascites.   Electronically Signed   By: Lavonia Dana M.D.   On: 08/28/2014 12:02   Ct Biopsy  08/27/2014   CLINICAL DATA:  Unexplained thrombocytopenia. The patient requires a bone  marrow biopsy.  EXAM: CT GUIDED ASPIRATE AND CORE RIGHT ILIAC BONE MARROW BIOPSY  ANESTHESIA/SEDATION: Versed 2.0 mg IV, Fentanyl 100 mcg IV  Total Moderate Sedation Time  7 minutes.  PROCEDURE: The procedure risks, benefits, and alternatives were explained to the patient. Questions regarding the procedure were encouraged and answered. The patient understands and consents to the procedure.  The right gluteal region was prepped with Betadine. Sterile gown and sterile gloves were used for the procedure. Local anesthesia was provided with 1% Lidocaine. A time-out was performed prior to the procedure.  Under CT guidance, an 11 gauge OnControl bone cutting needle was advanced from a posterior approach into the right iliac bone. Needle positioning was confirmed with CT. Initial non heparinized and heparinized aspirate samples were obtained of bone marrow. Core biopsy was performed with the outer biopsy needle.  COMPLICATIONS: None  FINDINGS: Inspection of initial aspirate did reveal visible particles. Intact core biopsy sample was obtained.  IMPRESSION: CT guided bone marrow biopsy of right posterior iliac bone with both aspirate and core samples obtained.   Electronically Signed   By: Aletta Edouard M.D.   On: 08/27/2014 13:27  [2 weeks]   Assessment:  39 year old female with history of NASH cirrhosis, presenting with hematemesis and melena, Hgb 6.5 on admission with total of 4 units PRBCs thus far. EGD 2/7 at bedside with 2 small ulcers at GE junction felt to be source of GI bleeding, single short column of varix not large enough to be banded. Evaluated at [facility] for transplant consideration early Jan, last visit Feb 1st, recommended to establish care with substance abuse counselor due to urine drug screen positive for cannabis. Patient seems to be confused how to accomplish this but in her defense has had only few days since her appt with [facility] and this hospitalization. No further overt GI bleeding. Currently  not on a non-selective beta blocker, although she has been on propanolol 20 mg BID in the past. Per [facility] notes Feb 1, not thought to be able to tolerate small dose of non-selective beta blocker. Has undergone serial EGDs for banding to obliteration. Hgb remaining stable.   Plan: 1. PPI BID. 2. Patient needs to contact substance abuse counselor as advised by liver clinic. According to their records, she was provided with all appropriate contact information.  3. Keep upcoming appointments at [place] this month 10/04/14. 4. D/C carafate per Dr. Oneida Alar.   Laureen Ochs. Bernarda Caffey [facility] [phone] 2/10/20169:55 AM     LOS: 3 days      Attending note:  Patient seen and examined this afternoon. Discussed with Dr. Roderic Palau.  Agree with plans for discharge with regimen and follow-up as outlined above.  We'll plan to get her back in the office locally in about 6-8 weeks.

## 2014-09-20 LAB — TYPE AND SCREEN
ABO/RH(D): A POS
Antibody Screen: NEGATIVE
UNIT DIVISION: 0
UNIT DIVISION: 0
UNIT DIVISION: 0
UNIT DIVISION: 0
Unit division: 0
Unit division: 0
Unit division: 0
Unit division: 0
Unit division: 0

## 2014-09-20 NOTE — Care Management Utilization Note (Signed)
UR completed 

## 2014-09-21 ENCOUNTER — Other Ambulatory Visit: Payer: Self-pay

## 2014-09-21 ENCOUNTER — Encounter (HOSPITAL_COMMUNITY): Payer: Self-pay

## 2014-09-21 MED ORDER — METOLAZONE 5 MG PO TABS: 10.0000 mg | ORAL_TABLET | Freq: Every day | ORAL | Status: DC

## 2014-09-26 ENCOUNTER — Telehealth: Payer: Self-pay

## 2014-09-26 ENCOUNTER — Encounter (HOSPITAL_BASED_OUTPATIENT_CLINIC_OR_DEPARTMENT_OTHER): Payer: Medicare Other

## 2014-09-26 DIAGNOSIS — D7589 Other specified diseases of blood and blood-forming organs: Secondary | ICD-10-CM

## 2014-09-26 DIAGNOSIS — D696 Thrombocytopenia, unspecified: Secondary | ICD-10-CM

## 2014-09-26 DIAGNOSIS — I8501 Esophageal varices with bleeding: Secondary | ICD-10-CM | POA: Diagnosis not present

## 2014-09-26 LAB — CBC WITH DIFFERENTIAL/PLATELET
BASOS ABS: 0.1 10*3/uL (ref 0.0–0.1)
Basophils Relative: 1 % (ref 0–1)
EOS ABS: 0.1 10*3/uL (ref 0.0–0.7)
Eosinophils Relative: 1 % (ref 0–5)
HEMATOCRIT: 32.9 % — AB (ref 36.0–46.0)
HEMOGLOBIN: 10.5 g/dL — AB (ref 12.0–15.0)
Lymphocytes Relative: 27 % (ref 12–46)
Lymphs Abs: 2.2 10*3/uL (ref 0.7–4.0)
MCH: 32.2 pg (ref 26.0–34.0)
MCHC: 31.9 g/dL (ref 30.0–36.0)
MCV: 100.9 fL — AB (ref 78.0–100.0)
MONO ABS: 0.5 10*3/uL (ref 0.1–1.0)
Monocytes Relative: 7 % (ref 3–12)
Neutro Abs: 5.1 10*3/uL (ref 1.7–7.7)
Neutrophils Relative %: 64 % (ref 43–77)
PLATELETS: 131 10*3/uL — AB (ref 150–400)
RBC: 3.26 MIL/uL — ABNORMAL LOW (ref 3.87–5.11)
RDW: 21.9 % — AB (ref 11.5–15.5)
WBC: 7.9 10*3/uL (ref 4.0–10.5)

## 2014-09-26 LAB — FOLATE: FOLATE: 3.6 ng/mL

## 2014-09-26 LAB — VITAMIN B12: VITAMIN B 12: 641 pg/mL (ref 211–911)

## 2014-09-26 NOTE — Telephone Encounter (Signed)
PLEASE CALL PT. IF SHE IS HAVING FLUID IN HER ABDOMEN SHE SHOULD HAVE A PARACENTESIS. THE FLUID RETAINED IN HER LOWER EXTREMITIES NEEDS TO BE ADDRESSED BY HER NEPHROLOGIST. SHE MAY NEED MORE FLUID PULLED OFF DURING DIALYSIS.   SCHEDULE A large volume paracentesis ASAP. SHE NEEDS ALBUMIN 25 GMS IV AT ONSET AND REPEAT AFTER 4 LS. S SHE NEEDS TO STRICTLY FOLLOW A LOW SALT DIET.

## 2014-09-26 NOTE — Telephone Encounter (Signed)
Pt called and said she is having a lot of swelling in her abdomen and  thighs and some around her ankles.  She picked up the metolazone ( Zaroxolyn) 5 mg taking 2 tablets a day since 09/21/2014.  Please advise!

## 2014-09-26 NOTE — Progress Notes (Signed)
Lab visit

## 2014-09-26 NOTE — Telephone Encounter (Signed)
Tried to call and machine said she is not available and to try again later.

## 2014-09-27 IMAGING — CR DG CHEST 1V PORT
1 series · 1 of 1 positions shown · non-contrast
Comparison: Chest x-ray earlier today.

CLINICAL DATA: Status post intubation.

EXAM:
PORTABLE CHEST - 1 VIEW

[portable]
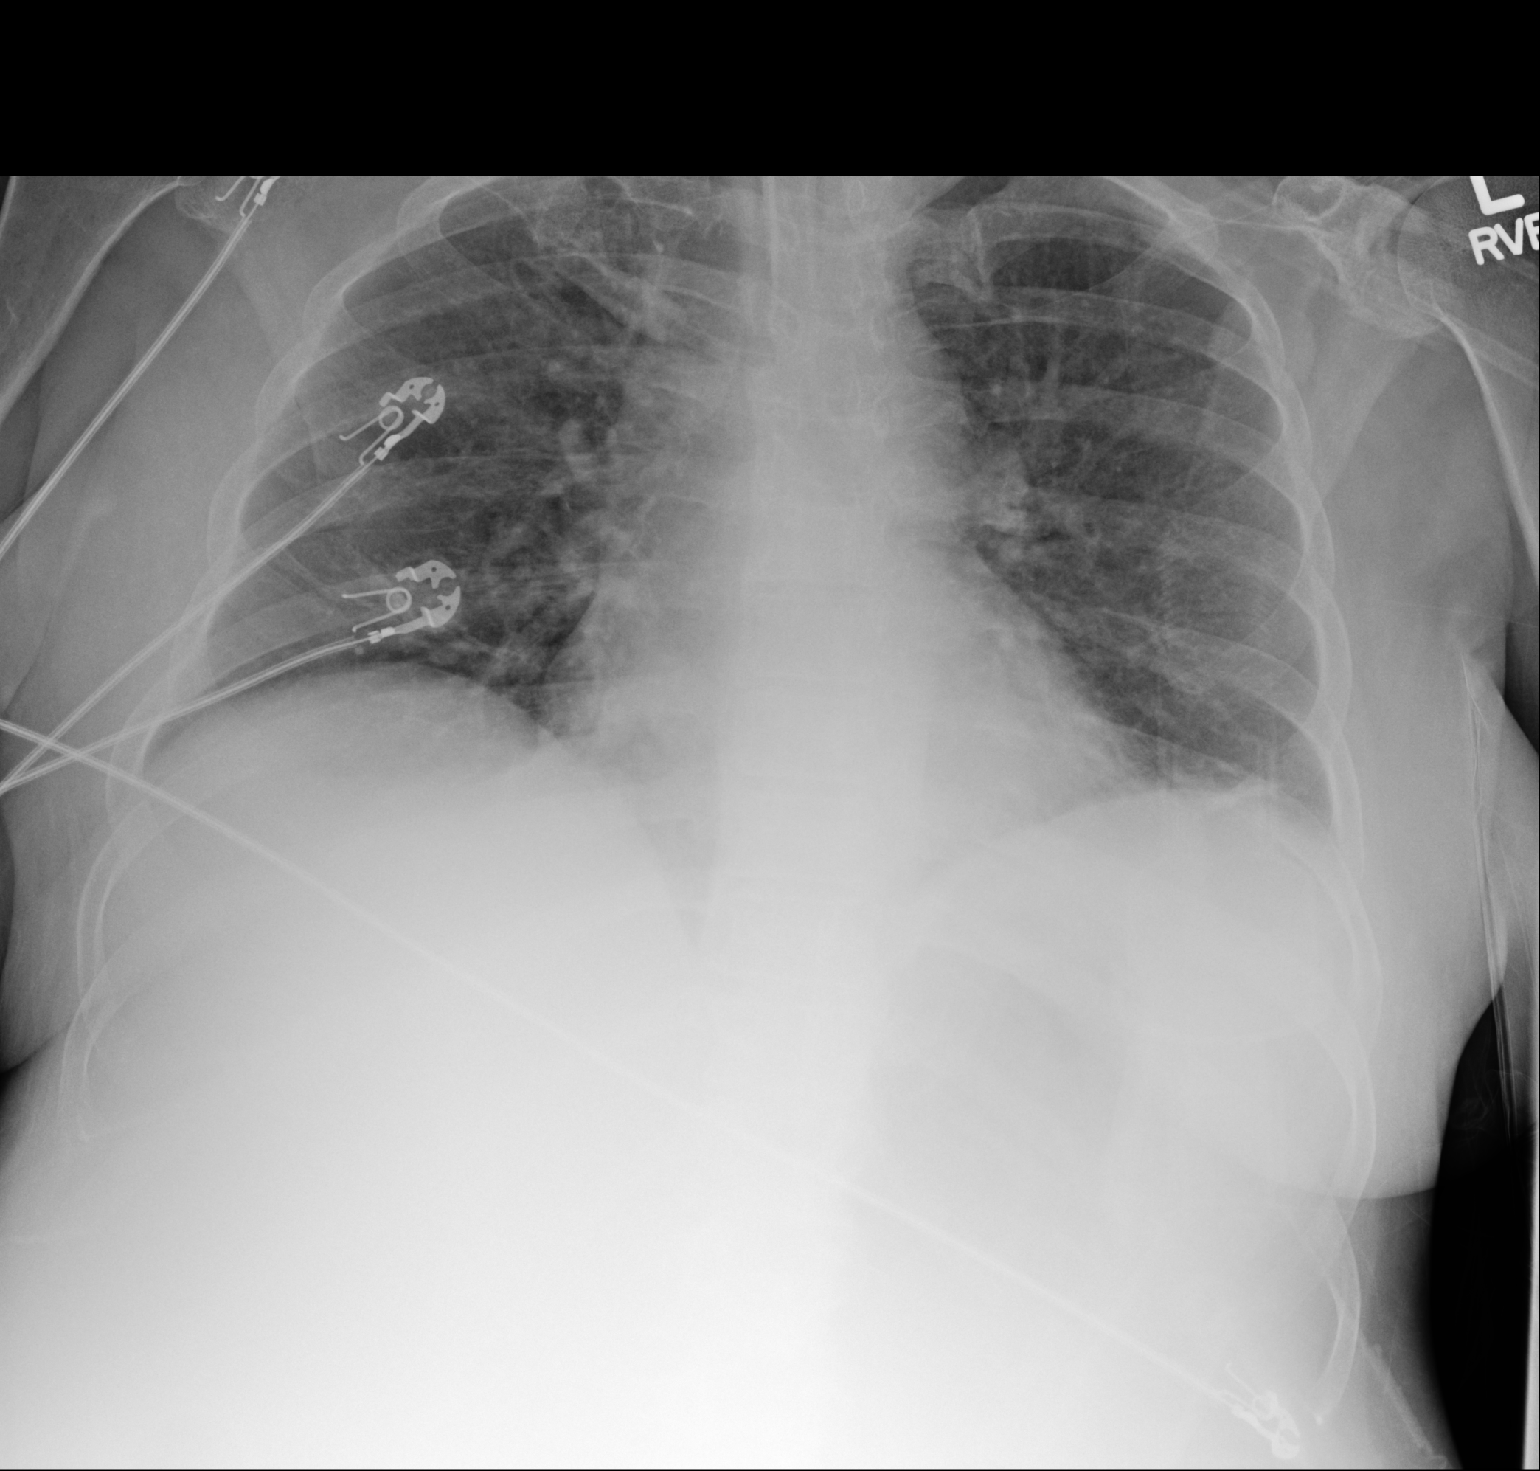

[1 of 1 positions shown; findings below may reference images not displayed]

FINDINGS: Endotracheal tube is been placed with the tip approximately 2.5 cm
above the carina and in good position. Lungs show mild bibasilar
atelectasis. There is no evidence of pulmonary edema, consolidation,
pneumothorax, nodule or pleural fluid. The heart size is stable and
within normal limits.
IMPRESSION: Appropriate positioning of endotracheal tube. Bibasilar atelectasis
without acute pulmonary process identified.

## 2014-09-27 NOTE — Telephone Encounter (Signed)
Tried to call, not available and told to try call again later.

## 2014-09-27 NOTE — Telephone Encounter (Signed)
Talked with [name] and he is going to talk with Fable to see about the Para. She is at dialysis now. He will call me back

## 2014-09-28 ENCOUNTER — Other Ambulatory Visit: Payer: Self-pay

## 2014-09-28 ENCOUNTER — Telehealth: Payer: Self-pay

## 2014-09-28 DIAGNOSIS — R609 Edema, unspecified: Secondary | ICD-10-CM

## 2014-09-28 NOTE — Telephone Encounter (Signed)
Pt called wanting a para. Orders are in computer per DR. FIELDS request on 09/26/2014. Told pt to call [facility] radiology to set it up for her.

## 2014-09-28 NOTE — Telephone Encounter (Signed)
Pt called wanting a para. Orders are in computer.

## 2014-09-30 IMAGING — US US PARACENTESIS
1 series · 3 of 3 positions shown · non-contrast
Comparison: None.

CLINICAL DATA: Ascites

EXAM:
ULTRASOUND GUIDED  PARACENTESIS

[Series 1: us paracentesis · 0.26mm/px · 3 of 3 slices shown]
[im 1/3]
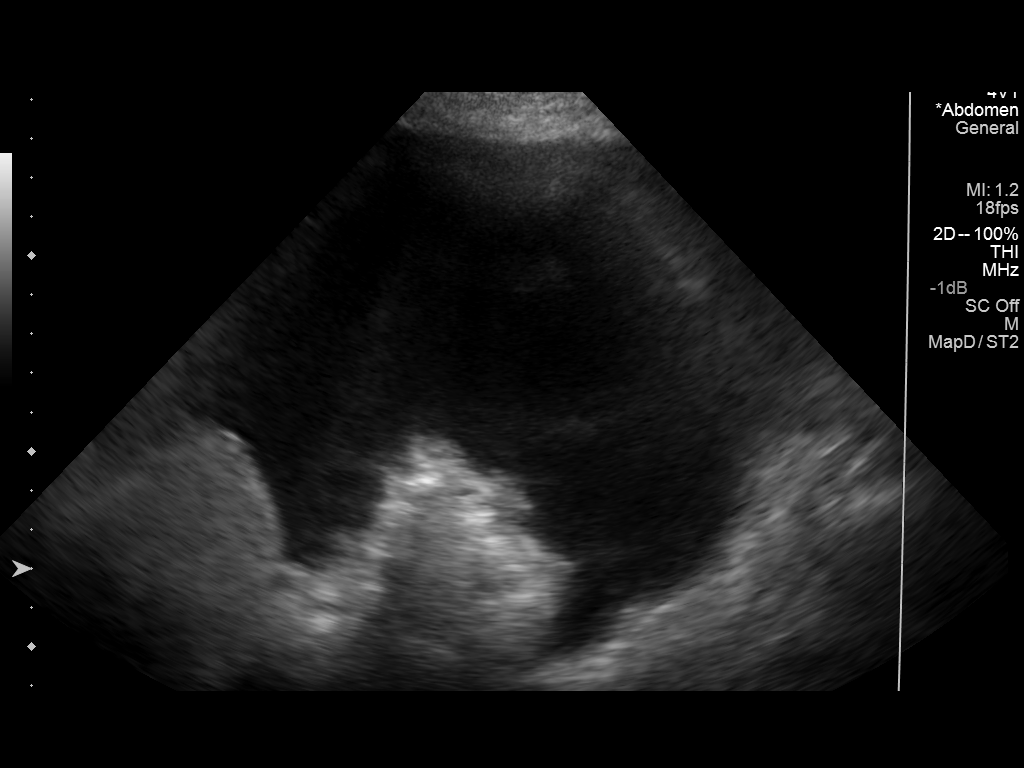
[im 2/3]
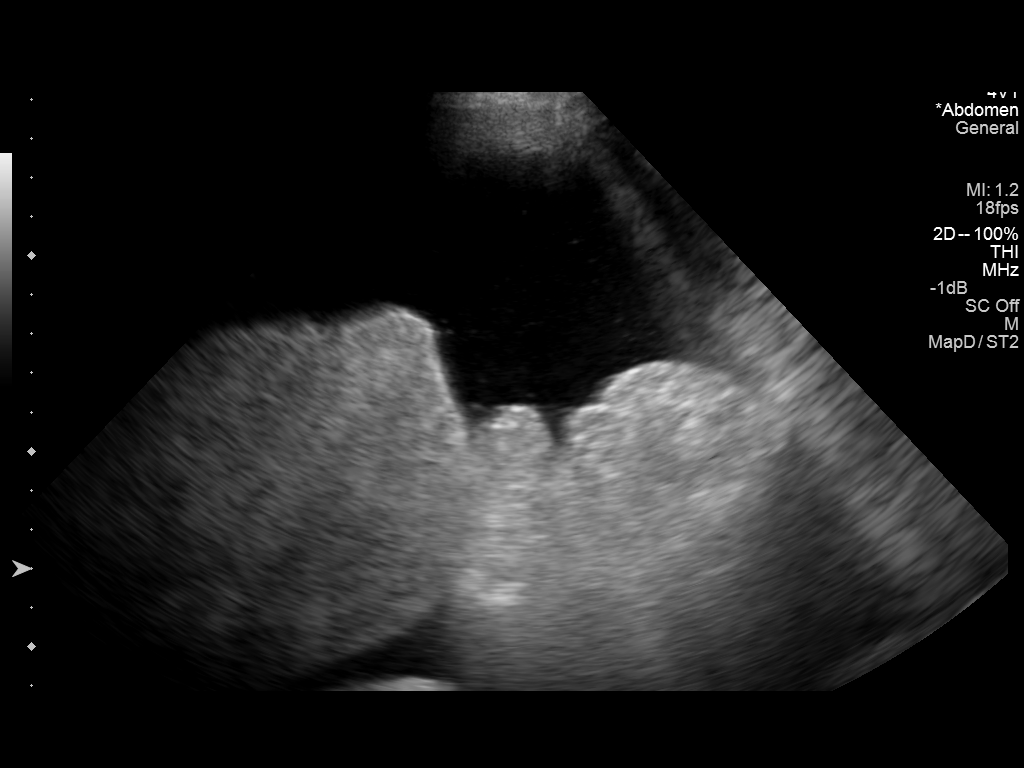
[im 3/3]
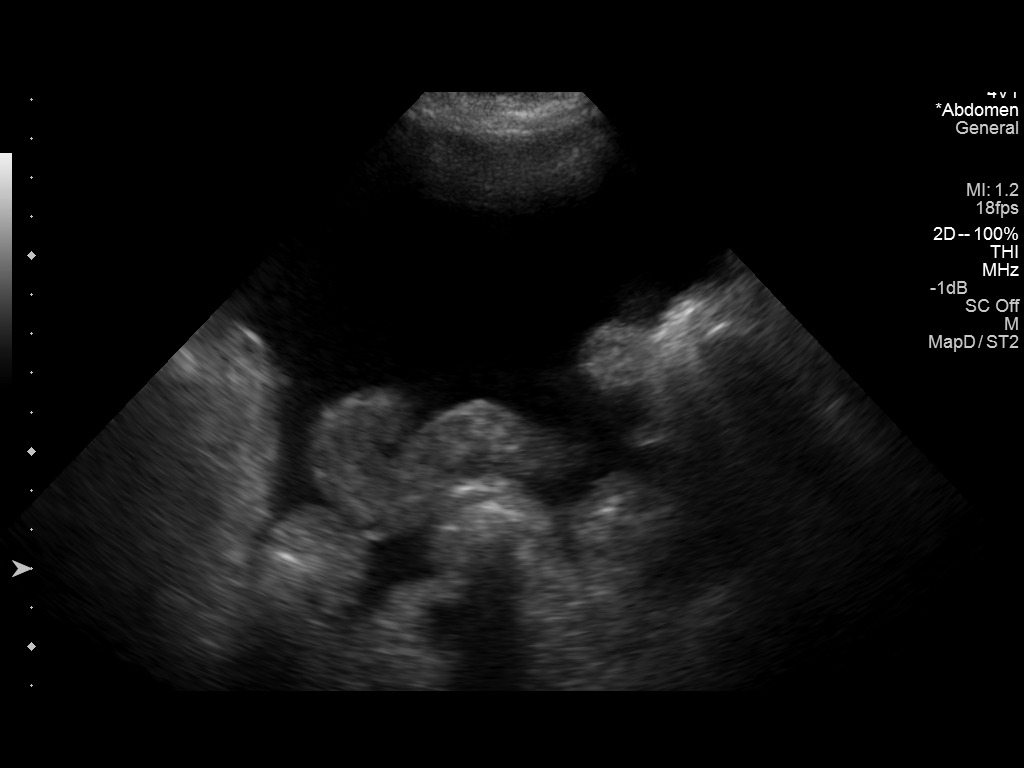

[3 of 3 positions shown; findings below may reference images not displayed]

PROCEDURE:
An ultrasound guided paracentesis was thoroughly discussed with the
patient and questions answered. The benefits, risks, alternatives
and complications were also discussed. The patient understands and
wishes to proceed with the procedure. Written consent was obtained.

Ultrasound was performed to localize and mark an adequate pocket of
fluid in the right lower quadrant of the abdomen. The area was then
prepped and draped in the normal sterile fashion. 1% Lidocaine was
used for local anesthesia. Under ultrasound guidance a 19 gauge Yueh
catheter was introduced. Paracentesis was performed. The catheter
was removed and a dressing applied.

Complications: None.
FINDINGS: A total of approximately 6211 mL of clear straw-colored fluid was
removed. A fluid sample was sent for laboratory analysis.
IMPRESSION: Successful ultrasound guided paracentesis yielding 6211 mL of
ascites.

## 2014-10-02 ENCOUNTER — Other Ambulatory Visit (HOSPITAL_COMMUNITY): Payer: Self-pay | Admitting: Oncology

## 2014-10-02 DIAGNOSIS — E538 Deficiency of other specified B group vitamins: Secondary | ICD-10-CM

## 2014-10-02 MED ORDER — FOLIC ACID 1 MG PO TABS: 1.0000 mg | ORAL_TABLET | Freq: Every day | ORAL | Status: DC

## 2014-10-03 ENCOUNTER — Other Ambulatory Visit: Payer: Self-pay | Admitting: *Deleted

## 2014-10-03 ENCOUNTER — Encounter (HOSPITAL_COMMUNITY): Payer: Self-pay

## 2014-10-03 ENCOUNTER — Ambulatory Visit (HOSPITAL_COMMUNITY)
Admission: RE | Admit: 2014-10-03 | Discharge: 2014-10-03 | Disposition: A | Discharge status: Home / Self Care | Payer: Medicare Other | Source: Ambulatory Visit | Attending: Gastroenterology | Admitting: Gastroenterology

## 2014-10-03 ENCOUNTER — Encounter: Payer: Self-pay | Admitting: Vascular Surgery

## 2014-10-03 DIAGNOSIS — K7581 Nonalcoholic steatohepatitis (NASH): Secondary | ICD-10-CM | POA: Insufficient documentation

## 2014-10-03 DIAGNOSIS — R188 Other ascites: Secondary | ICD-10-CM | POA: Diagnosis not present

## 2014-10-03 DIAGNOSIS — K7460 Unspecified cirrhosis of liver: Secondary | ICD-10-CM | POA: Diagnosis present

## 2014-10-03 DIAGNOSIS — N179 Acute kidney failure, unspecified: Secondary | ICD-10-CM

## 2014-10-03 DIAGNOSIS — Z01810 Encounter for preprocedural cardiovascular examination: Secondary | ICD-10-CM

## 2014-10-03 DIAGNOSIS — R609 Edema, unspecified: Secondary | ICD-10-CM

## 2014-10-03 MED ORDER — ALBUMIN HUMAN 25 % IV SOLN
INTRAVENOUS | Status: AC
  Administered 2014-10-03: 25 g
  Filled 2014-10-03: qty 100

## 2014-10-03 NOTE — Progress Notes (Signed)
Paracentesis complete no signs odf distress. 7000 yellow colored ascites removed.

## 2014-10-10 ENCOUNTER — Encounter (HOSPITAL_COMMUNITY): Payer: Medicare Other

## 2014-10-12 ENCOUNTER — Encounter (HOSPITAL_COMMUNITY): Payer: Medicare Other | Attending: Gastroenterology

## 2014-10-12 ENCOUNTER — Ambulatory Visit (HOSPITAL_COMMUNITY): Payer: Self-pay | Admitting: Oncology

## 2014-10-12 ENCOUNTER — Encounter (HOSPITAL_COMMUNITY): Payer: Self-pay

## 2014-10-12 DIAGNOSIS — I8501 Esophageal varices with bleeding: Secondary | ICD-10-CM | POA: Insufficient documentation

## 2014-10-12 NOTE — Progress Notes (Signed)
-  No show-  KEFALAS,THOMAS  

## 2014-10-12 NOTE — Assessment & Plan Note (Signed)
Patient was seen initially in the hospital in Jan 2016 for thrombocytopenia.  Work-up was negative and she was started on high dose steroids with hopes of improvement.  Bone marrow aspiration and biopsy on 08/27/2014 was negative.  Significant improvement not noted during hospitalization.  She was therefore tapered as an outpatient following discharge.  Labs demonstrate a significant improvement in anemia and thrombocytopenia.  Suspected etiology is uremia.  Labs in 4 weeks and 8 weeks: CBC diff.  Return in 8 weeks for follow-up.

## 2014-10-19 ENCOUNTER — Encounter (HOSPITAL_COMMUNITY): Payer: Self-pay

## 2014-10-23 ENCOUNTER — Encounter: Payer: Self-pay | Admitting: Vascular Surgery

## 2014-10-24 ENCOUNTER — Encounter: Payer: Self-pay | Admitting: Vascular Surgery

## 2014-10-24 ENCOUNTER — Other Ambulatory Visit: Payer: Self-pay

## 2014-10-24 ENCOUNTER — Ambulatory Visit (INDEPENDENT_AMBULATORY_CARE_PROVIDER_SITE_OTHER): Payer: Medicare Other | Admitting: Vascular Surgery

## 2014-10-24 ENCOUNTER — Ambulatory Visit (INDEPENDENT_AMBULATORY_CARE_PROVIDER_SITE_OTHER)
Admission: RE | Admit: 2014-10-24 | Discharge: 2014-10-24 | Disposition: A | Discharge status: Home / Self Care | Payer: Medicare Other | Source: Ambulatory Visit

## 2014-10-24 ENCOUNTER — Ambulatory Visit (INDEPENDENT_AMBULATORY_CARE_PROVIDER_SITE_OTHER)
Admission: RE | Admit: 2014-10-24 | Discharge: 2014-10-24 | Disposition: A | Discharge status: Home / Self Care | Payer: Medicare Other | Source: Ambulatory Visit | Attending: Vascular Surgery | Admitting: Vascular Surgery

## 2014-10-24 VITALS — BP 121/63 | HR 118 | Ht 62.0 in | Wt 150.0 lb

## 2014-10-24 DIAGNOSIS — K7460 Unspecified cirrhosis of liver: Secondary | ICD-10-CM | POA: Diagnosis not present

## 2014-10-24 DIAGNOSIS — Z01810 Encounter for preprocedural cardiovascular examination: Secondary | ICD-10-CM

## 2014-10-24 DIAGNOSIS — N179 Acute kidney failure, unspecified: Secondary | ICD-10-CM

## 2014-10-24 DIAGNOSIS — K920 Hematemesis: Secondary | ICD-10-CM | POA: Diagnosis not present

## 2014-10-24 DIAGNOSIS — N184 Chronic kidney disease, stage 4 (severe): Secondary | ICD-10-CM | POA: Diagnosis not present

## 2014-10-24 NOTE — Progress Notes (Signed)
[facility]  Patient name: Wanda [name] MRN: [id number] DOB: 1975/10/20 Sex: female  REASON FOR CONSULT: Evaluate for new hemodialysis access. Referred by Dr. Lowanda Foster.  HPI: Wanda [name] is a 39 y.o. female who is not yet on dialysis. She was referred for hemodialysis access. She dialyzes on Tuesdays Thursdays and Saturdays. She has a functioning right IJ tunneled dialysis catheter that was placed proximally 2 months ago. She is not sure of the etiology of her chronic renal insufficiency. She denies any recent uremic symptoms. She denies anorexia, fatigue, or palpitations. She does have some problems with nausea and vomiting which have been chronic. She also requires intermittent paracentesis.  She is right handed.  Past Medical History  Diagnosis Date  . GERD (gastroesophageal reflux disease)   . 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 c/w steatohepatitis  . Folate deficiency 09/2013  . Anasarca 10/10/2013  . Hematemesis/vomiting blood 02/24/2014  . Acute blood loss anemia 02/25/2014    Status post transfusion  . Macrocytosis 02/28/2014  . 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 Acute renal failure 09/2013    Pre-renal- resolved  . C. difficile colitis 04/19/2014  . Anxiety   . Depression   . Thrombocytopenia     Hypercellular bone marrow; abundant megakaryocytes per 08/27/2014; s/p bone marrow bx  . Cirrhosis of liver with ascites   . SBP (spontaneous bacterial peritonitis) 11/10/2013  . Bipolar disorder 12/04/2013    2007-SEEN IN ED FOR INVOLUNTARY COMMITMENT, UDS POS FOR AMPHETAMINES/OPIATES   . PNA (pneumonia) 10/13/2013  . Chronic hypotension   . Gastroesophageal junction ulcer 09/17/2014  . ESRD (end stage renal disease) on dialysis 08/2014   Family History  Problem Relation Age of Onset  . Heart disease Mother   . Colon cancer Neg Hx   . Liver disease Neg Hx    SOCIAL  HISTORY: History  Substance Use Topics  . Smoking status: Former Smoker -- 0.25 packs/day for 20 years    Types: Cigarettes    Quit date: 11/05/2008  . Smokeless tobacco: Former Systems developer  . Alcohol Use: No   Allergies  Allergen Reactions  . Lasix [Furosemide]     "doesn't work"  . Morphine And Related Hives and Itching   Current Outpatient Prescriptions  Medication Sig Dispense Refill  . folic acid (FOLVITE) 1 MG tablet Take 1 tablet (1 mg total) by mouth daily. 30 tablet 5  . lactulose (CHRONULAC) 10 GM/15ML solution Take 15 mLs (10 g total) by mouth 2 (two) times daily. 240 mL 0  . lidocaine (XYLOCAINE) 2 % solution 2 TSP  PO QAC AND HS TO PREVENT FOR CHEST PAIN WHILE EATING 500 mL 1  . loperamide (IMODIUM) 2 MG capsule Take 1 capsule (2 mg total) by mouth 2 (two) times daily as needed for diarrhea or loose stools. 30 capsule 0  . LORazepam (ATIVAN) 1 MG tablet Take 1 tablet (1 mg total) by mouth every 12 (twelve) hours as needed for anxiety. 15 tablet 0  . magnesium oxide (MAG-OX) 400 (241.3 MG) MG tablet Take 1 tablet (400 mg total) by mouth daily. 30 tablet 3  . metolazone (ZAROXOLYN) 5 MG tablet Take 2 tablets (10 mg total) by mouth daily. 60 tablet 3  . midodrine (PROAMATINE) 10 MG tablet Take 1 tablet (10 mg total) by mouth 2 (two) times daily with a meal. Medication to keep your blood pressure from  falling. 60 tablet 3  . Nutritional Supplements (FEEDING SUPPLEMENT, NEPRO CARB STEADY,) LIQD Take 237 mLs by mouth as needed (missed meal during dialysis.).  0  . pantoprazole (PROTONIX) 40 MG tablet Take 1 tablet (40 mg total) by mouth 2 (two) times daily. 60 tablet 2  . promethazine (PHENERGAN) 12.5 MG tablet Take 1 tablet (12.5 mg total) by mouth every 6 (six) hours as needed for nausea or vomiting. 30 tablet 1  . Oxycodone HCl 10 MG TABS Take 1 tablet (10 mg total) by mouth every 6 (six) hours as needed. (Patient not taking: Reported on 10/24/2014) 25 tablet 0   No current  facility-administered medications for this visit.   REVIEW OF SYSTEMS: Valu.Nieves ] denotes positive finding; [  ] denotes negative finding  CARDIOVASCULAR:  [ ]  chest pain   [ ]  chest pressure   [ ]  palpitations   [ ]  orthopnea   [ ]  dyspnea on exertion   [ ]  claudication   [ ]  rest pain   [ ]  DVT   [ ]  phlebitis PULMONARY:   [ ]  productive cough   [ ]  asthma   [ ]  wheezing NEUROLOGIC:   [ ]  weakness  [ ]  paresthesias  [ ]  aphasia  [ ]  amaurosis  [ ]  dizziness HEMATOLOGIC:   [ ]  bleeding problems   [ ]  clotting disorders MUSCULOSKELETAL:  [ ]  joint pain   [ ]  joint swelling Valu.Nieves ] leg swelling GASTROINTESTINAL: Valu.Nieves ]  blood in stool  [ ]   hematemesis GENITOURINARY:  [ ]   dysuria  [ ]   hematuria PSYCHIATRIC:  [ ]  history of major depression INTEGUMENTARY:  [ ]  rashes  [ ]  ulcers CONSTITUTIONAL:  [ ]  fever   [ ]  chills  PHYSICAL EXAM: Filed Vitals:   10/24/14 1041  BP: 121/63  Pulse: 118  Height: 5\' 2"  (1.575 m)  Weight: 150 lb (68.04 kg)  SpO2: 100%   Body mass index is 27.43 kg/(m^2). GENERAL: The patient is a well-nourished female, in no acute distress. The vital signs are documented above. CARDIOVASCULAR: There is a regular rate and rhythm. I do not detect carotid bruits. She has palpable brachial and radial pulses bilaterally. PULMONARY: There is good air exchange bilaterally without wheezing or rales. ABDOMEN: Soft and non-tender with normal pitched bowel sounds. Her abdomen is distended. MUSCULOSKELETAL: There are no major deformities or cyanosis. NEUROLOGIC: No focal weakness or paresthesias are detected. SKIN: There are no ulcers or rashes noted. PSYCHIATRIC: The patient has a normal affect.  DATA:  Her GFR was 21 on 09/17/2014.  I have independently interpreted her vein mapping. She does not appear to have adequate vein in the left arm for fistula. It looks like her best option for a fistula would be a right brachiocephalic fistula.  I have also independently interpreted her  arterial Doppler study which shows triphasic Doppler signals in the radial and ulnar positions bilaterally.  MEDICAL ISSUES:  STAGE V CHRONIC KIDNEY DISEASE: This patient presents for new access. I think her best option for a fistula would be a right brachiocephalic AV fistula. I have explained the indications for placement of an AV fistula or AV graft. I've explained that if at all possible we will place an AV fistula.  I have reviewed the risks of placement of an AV fistula including but not limited to: failure of the fistula to mature, need for subsequent interventions, and thrombosis. In addition I have reviewed the potential complications of placement of  an AV graft. These risks include, but are not limited to, graft thrombosis, graft infection, wound healing problems, bleeding, arm swelling, and steal syndrome. All the patient's questions were answered and they are agreeable to proceed with surgery. Her surgery is scheduled for 11/16/2014.  [place] [facility]: [phone]

## 2014-10-25 ENCOUNTER — Encounter (HOSPITAL_COMMUNITY): Payer: Self-pay

## 2014-10-25 ENCOUNTER — Inpatient Hospital Stay (HOSPITAL_COMMUNITY)
Admission: EM | Admit: 2014-10-25 | Discharge: 2014-11-01 | DRG: 420 | Disposition: A | Discharge status: Home / Self Care | Payer: Medicare Other | Attending: Internal Medicine | Admitting: Internal Medicine

## 2014-10-25 DIAGNOSIS — Z789 Other specified health status: Secondary | ICD-10-CM | POA: Diagnosis not present

## 2014-10-25 DIAGNOSIS — N186 End stage renal disease: Secondary | ICD-10-CM | POA: Diagnosis present

## 2014-10-25 DIAGNOSIS — K3189 Other diseases of stomach and duodenum: Secondary | ICD-10-CM | POA: Diagnosis not present

## 2014-10-25 DIAGNOSIS — Z978 Presence of other specified devices: Secondary | ICD-10-CM | POA: Insufficient documentation

## 2014-10-25 DIAGNOSIS — K7031 Alcoholic cirrhosis of liver with ascites: Secondary | ICD-10-CM | POA: Diagnosis not present

## 2014-10-25 DIAGNOSIS — I8500 Esophageal varices without bleeding: Secondary | ICD-10-CM | POA: Diagnosis not present

## 2014-10-25 DIAGNOSIS — K7581 Nonalcoholic steatohepatitis (NASH): Secondary | ICD-10-CM | POA: Diagnosis present

## 2014-10-25 DIAGNOSIS — R188 Other ascites: Secondary | ICD-10-CM | POA: Diagnosis present

## 2014-10-25 DIAGNOSIS — F319 Bipolar disorder, unspecified: Secondary | ICD-10-CM | POA: Diagnosis present

## 2014-10-25 DIAGNOSIS — K2970 Gastritis, unspecified, without bleeding: Secondary | ICD-10-CM | POA: Diagnosis present

## 2014-10-25 DIAGNOSIS — Z87891 Personal history of nicotine dependence: Secondary | ICD-10-CM

## 2014-10-25 DIAGNOSIS — K219 Gastro-esophageal reflux disease without esophagitis: Secondary | ICD-10-CM | POA: Diagnosis present

## 2014-10-25 DIAGNOSIS — K429 Umbilical hernia without obstruction or gangrene: Secondary | ICD-10-CM | POA: Diagnosis present

## 2014-10-25 DIAGNOSIS — K449 Diaphragmatic hernia without obstruction or gangrene: Secondary | ICD-10-CM | POA: Diagnosis present

## 2014-10-25 DIAGNOSIS — R110 Nausea: Secondary | ICD-10-CM

## 2014-10-25 DIAGNOSIS — K920 Hematemesis: Secondary | ICD-10-CM | POA: Diagnosis present

## 2014-10-25 DIAGNOSIS — N179 Acute kidney failure, unspecified: Secondary | ICD-10-CM | POA: Diagnosis present

## 2014-10-25 DIAGNOSIS — D696 Thrombocytopenia, unspecified: Secondary | ICD-10-CM | POA: Diagnosis present

## 2014-10-25 DIAGNOSIS — I8501 Esophageal varices with bleeding: Secondary | ICD-10-CM | POA: Diagnosis present

## 2014-10-25 DIAGNOSIS — Z8249 Family history of ischemic heart disease and other diseases of the circulatory system: Secondary | ICD-10-CM

## 2014-10-25 DIAGNOSIS — D62 Acute posthemorrhagic anemia: Secondary | ICD-10-CM | POA: Diagnosis present

## 2014-10-25 DIAGNOSIS — E876 Hypokalemia: Secondary | ICD-10-CM | POA: Diagnosis present

## 2014-10-25 DIAGNOSIS — Z992 Dependence on renal dialysis: Secondary | ICD-10-CM

## 2014-10-25 DIAGNOSIS — K7460 Unspecified cirrhosis of liver: Secondary | ICD-10-CM | POA: Diagnosis present

## 2014-10-25 LAB — PROTIME-INR
INR: 1.21 (ref 0.00–1.49)
Prothrombin Time: 15.4 seconds — ABNORMAL HIGH (ref 11.6–15.2)

## 2014-10-25 LAB — CBC WITH DIFFERENTIAL/PLATELET
BASOS ABS: 0 10*3/uL (ref 0.0–0.1)
Basophils Relative: 0 % (ref 0–1)
Eosinophils Absolute: 0 10*3/uL (ref 0.0–0.7)
Eosinophils Relative: 0 % (ref 0–5)
HCT: 25.1 % — ABNORMAL LOW (ref 36.0–46.0)
Hemoglobin: 8.2 g/dL — ABNORMAL LOW (ref 12.0–15.0)
Lymphocytes Relative: 16 % (ref 12–46)
Lymphs Abs: 1.1 10*3/uL (ref 0.7–4.0)
MCH: 33.3 pg (ref 26.0–34.0)
MCHC: 32.7 g/dL (ref 30.0–36.0)
MCV: 102 fL — AB (ref 78.0–100.0)
MONOS PCT: 7 % (ref 3–12)
Monocytes Absolute: 0.4 10*3/uL (ref 0.1–1.0)
NEUTROS PCT: 77 % (ref 43–77)
Neutro Abs: 5.2 10*3/uL (ref 1.7–7.7)
Platelets: 81 10*3/uL — ABNORMAL LOW (ref 150–400)
RBC: 2.46 MIL/uL — ABNORMAL LOW (ref 3.87–5.11)
RDW: 16.7 % — ABNORMAL HIGH (ref 11.5–15.5)
SMEAR REVIEW: DECREASED
WBC: 6.8 10*3/uL (ref 4.0–10.5)

## 2014-10-25 LAB — COMPREHENSIVE METABOLIC PANEL
ALBUMIN: 3 g/dL — AB (ref 3.5–5.2)
ALK PHOS: 76 U/L (ref 39–117)
ALT: 24 U/L (ref 0–35)
AST: 71 U/L — AB (ref 0–37)
Anion gap: 9 (ref 5–15)
BUN: 34 mg/dL — ABNORMAL HIGH (ref 6–23)
CO2: 27 mmol/L (ref 19–32)
CREATININE: 1.35 mg/dL — AB (ref 0.50–1.10)
Calcium: 8.7 mg/dL (ref 8.4–10.5)
Chloride: 102 mmol/L (ref 96–112)
GFR calc Af Amer: 56 mL/min — ABNORMAL LOW (ref >90)
GFR calc non Af Amer: 48 mL/min — ABNORMAL LOW (ref >90)
Glucose, Bld: 92 mg/dL (ref 70–99)
Potassium: 3.5 mmol/L (ref 3.5–5.1)
Sodium: 138 mmol/L (ref 135–145)
Total Bilirubin: 2.1 mg/dL — ABNORMAL HIGH (ref 0.3–1.2)
Total Protein: 6.4 g/dL (ref 6.0–8.3)

## 2014-10-25 LAB — LIPASE, BLOOD: Lipase: 82 U/L — ABNORMAL HIGH (ref 11–59)

## 2014-10-25 LAB — PREPARE RBC (CROSSMATCH)

## 2014-10-25 MED ORDER — SODIUM CHLORIDE 0.9 % IJ SOLN
3.0000 mL | Freq: Two times a day (BID) | INTRAMUSCULAR | Status: DC
Start: 2014-10-25 — End: 2014-11-01
  Administered 2014-10-26 – 2014-11-01 (×11): 3 mL via INTRAVENOUS

## 2014-10-25 MED ORDER — SODIUM CHLORIDE 0.9 % IV BOLUS (SEPSIS)
1000.0000 mL | Freq: Once | INTRAVENOUS | Status: AC
  Administered 2014-10-25: 1000 mL via INTRAVENOUS

## 2014-10-25 MED ORDER — ONDANSETRON HCL 4 MG/2ML IJ SOLN
4.0000 mg | Freq: Four times a day (QID) | INTRAMUSCULAR | Status: DC | PRN
  Filled 2014-10-25 (×2): qty 2

## 2014-10-25 MED ORDER — OCTREOTIDE LOAD VIA INFUSION
50.0000 ug | Freq: Once | INTRAVENOUS | Status: AC
  Administered 2014-10-25: 50 ug via INTRAVENOUS
  Filled 2014-10-25: qty 25

## 2014-10-25 MED ORDER — ONDANSETRON HCL 4 MG/2ML IJ SOLN
4.0000 mg | Freq: Once | INTRAMUSCULAR | Status: AC
  Administered 2014-10-25: 4 mg via INTRAVENOUS
  Filled 2014-10-25: qty 2

## 2014-10-25 MED ORDER — PANTOPRAZOLE SODIUM 40 MG IV SOLR
40.0000 mg | Freq: Once | INTRAVENOUS | Status: AC
  Administered 2014-10-25: 40 mg via INTRAVENOUS
  Filled 2014-10-25: qty 40

## 2014-10-25 MED ORDER — MEPERIDINE HCL 100 MG/ML IJ SOLN
INTRAMUSCULAR | Status: AC
  Filled 2014-10-25: qty 2

## 2014-10-25 MED ORDER — CIPROFLOXACIN IN D5W 400 MG/200ML IV SOLN: 400.0000 mg | Freq: Once | INTRAVENOUS | Status: DC

## 2014-10-25 MED ORDER — ONDANSETRON HCL 4 MG/2ML IJ SOLN
INTRAMUSCULAR | Status: AC
  Filled 2014-10-25: qty 2

## 2014-10-25 MED ORDER — ETOMIDATE 2 MG/ML IV SOLN
INTRAVENOUS | Status: AC
  Filled 2014-10-25: qty 10

## 2014-10-25 MED ORDER — LIDOCAINE VISCOUS 2 % MT SOLN
OROMUCOSAL | Status: AC
  Filled 2014-10-25: qty 15

## 2014-10-25 MED ORDER — SODIUM CHLORIDE 0.9 % IV SOLN
25.0000 ug/h | INTRAVENOUS | Status: DC
  Administered 2014-10-25 – 2014-10-26 (×3): 25 ug/h via INTRAVENOUS
  Filled 2014-10-25 (×7): qty 1

## 2014-10-25 MED ORDER — SUCCINYLCHOLINE CHLORIDE 20 MG/ML IJ SOLN
INTRAMUSCULAR | Status: AC
  Filled 2014-10-25: qty 1

## 2014-10-25 MED ORDER — SODIUM CHLORIDE 0.9 % IV SOLN: INTRAVENOUS | Status: AC

## 2014-10-25 MED ORDER — SODIUM CHLORIDE 0.9 % IV SOLN: Freq: Once | INTRAVENOUS | Status: DC

## 2014-10-25 MED ORDER — HYDROMORPHONE HCL 1 MG/ML IJ SOLN
0.5000 mg | Freq: Once | INTRAMUSCULAR | Status: AC
  Administered 2014-10-25: 0.5 mg via INTRAVENOUS
  Filled 2014-10-25: qty 1

## 2014-10-25 MED ORDER — OCTREOTIDE ACETATE 500 MCG/ML IJ SOLN
50.0000 ug/h | INTRAMUSCULAR | Status: DC
Start: 2014-10-25 — End: 2014-10-25
  Filled 2014-10-25 (×5): qty 1

## 2014-10-25 MED ORDER — PANTOPRAZOLE SODIUM 40 MG IV SOLR
INTRAVENOUS | Status: AC
  Filled 2014-10-25: qty 80

## 2014-10-25 MED ORDER — SODIUM CHLORIDE 0.9 % IV SOLN
8.0000 mg/h | INTRAVENOUS | Status: DC
  Filled 2014-10-25 (×5): qty 80

## 2014-10-25 MED ORDER — OCTREOTIDE ACETATE 500 MCG/ML IJ SOLN
INTRAMUSCULAR | Status: AC
  Filled 2014-10-25: qty 1

## 2014-10-25 MED ORDER — MIDAZOLAM HCL 5 MG/5ML IJ SOLN
INTRAMUSCULAR | Status: AC
  Filled 2014-10-25: qty 10

## 2014-10-25 MED ORDER — SODIUM CHLORIDE 0.9 % IV SOLN: INTRAVENOUS | Status: DC

## 2014-10-25 MED ORDER — SODIUM CHLORIDE 0.9 % IV SOLN
Freq: Once | INTRAVENOUS | Status: AC
  Administered 2014-10-26: 02:00:00 via INTRAVENOUS

## 2014-10-25 MED ORDER — ONDANSETRON HCL 4 MG PO TABS
4.0000 mg | ORAL_TABLET | Freq: Four times a day (QID) | ORAL | Status: DC | PRN
  Administered 2014-10-26 – 2014-11-01 (×6): 4 mg via ORAL
  Filled 2014-10-25 (×6): qty 1

## 2014-10-25 NOTE — ED Notes (Signed)
AC called for Sandostatin.

## 2014-10-25 NOTE — ED Notes (Signed)
Pt. Using bedside commode.

## 2014-10-25 NOTE — ED Notes (Addendum)
Pt. Vomiting dark brown emesis.

## 2014-10-25 NOTE — ED Provider Notes (Addendum)
CSN: [id number]     Arrival date & time 10/25/14  1918 History   First MD Initiated Contact with Patient 10/25/14 1934     Chief Complaint  Patient presents with  . Hematemesis     (Consider location/radiation/quality/duration/timing/severity/associated sxs/prior Treatment) HPI..... Level V caveat for urgent need for intervention. Complex patient with cirrhosis, esophageal varices, renal failure presents with vomiting blood approximately 1 hour ago with associated black stool. Patient also complains of epigastric pain. Severity is moderate. Nothing makes symptoms better or worse.  Past Medical History  Diagnosis Date  . GERD (gastroesophageal reflux disease)   . 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 c/w steatohepatitis  . Folate deficiency 09/2013  . Anasarca 10/10/2013  . Hematemesis/vomiting blood 02/24/2014  . Acute blood loss anemia 02/25/2014    Status post transfusion  . Macrocytosis 02/28/2014  . 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 Acute renal failure 09/2013    Pre-renal- resolved  . C. difficile colitis 04/19/2014  . Anxiety   . Depression   . Thrombocytopenia     Hypercellular bone marrow; abundant megakaryocytes per 08/27/2014; s/p bone marrow bx  . Cirrhosis of liver with ascites   . SBP (spontaneous bacterial peritonitis) 11/10/2013  . Bipolar disorder 12/04/2013    2007-SEEN IN ED FOR INVOLUNTARY COMMITMENT, UDS POS FOR AMPHETAMINES/OPIATES   . PNA (pneumonia) 10/13/2013  . Chronic hypotension   . Gastroesophageal junction ulcer 09/17/2014  . ESRD (end stage renal disease) on dialysis 08/2014   Past Surgical History  Procedure Laterality Date  . None    . Paracentesis  Feb 2015    1180 fluid, negative fluid analysis.   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 Paracentesis   10/2013  . Colonoscopy N/A 12/19/2013    SLF:NO OBVIOUS SOURCE FOR ANEMIA IDETIFIED/ONE COLON POLYP REMOVED/Small internal hemorrhoids  . Esophagogastroduodenoscopy N/A 02/11/2014    Dr. Rourk:Esophageal varices with bleeding stigmata-status post esophageal band ligation therapy. Portal gastropathy  . Esophagogastroduodenoscopy N/A 07/04/2014    RMR: Persiting grade 2 esophageal varicies with bleeding stigmata status post band ligation. Significantly congested gastric mucosa iwith changes constistant with protal gastropathy.   . Esophageal banding  07/04/2014    Procedure: ESOPHAGEAL BANDING;  Surgeon: Daneil Dolin, MD;  Location: [facility];  Service: Endoscopy;;  . Esophagogastroduodenoscopy (egd) with propofol N/A 07/24/2014    SLF:  1. 2 columns grade 2-3 varices- 2 Bands applied.  2.  Moderate gastropathy 3. Duodenal Diverticula  . Esophageal banding N/A 07/24/2014    Procedure: ESOPHAGEAL BANDING (2 bands applied);  Surgeon: Danie Binder, MD;  Location: [facility];  Service: Endoscopy;  Laterality: N/A;  . Central venous catheter insertion Right   . Esophagogastroduodenoscopy N/A 09/16/2014    Procedure: ESOPHAGOGASTRODUODENOSCOPY (EGD) AT BEDSIDE;  Surgeon: Rogene Houston, MD;  Location: [facility];  Service: Endoscopy;  Laterality: N/A;   Family History  Problem Relation Age of Onset  . Heart disease Mother   . Colon cancer Neg Hx   . Liver disease Neg Hx    History  Substance Use Topics  . Smoking status: Former Smoker -- 0.25 packs/day for 20 years    Types: Cigarettes    Quit date: 11/05/2008  . Smokeless tobacco: Former Systems developer  . Alcohol Use: No   OB History  Gravida Para Term Preterm AB TAB SAB Ectopic Multiple Living   1 1 1       1      Review of Systems  Unable to perform ROS: Acuity of condition      Allergies  Lasix and Morphine and related  Home Medications   Prior to Admission medications   Medication Sig Start Date End Date Taking? Authorizing  Provider  folic acid (FOLVITE) 1 MG tablet Take 1 tablet (1 mg total) by mouth daily. 10/02/14  Yes Manon Hilding Kefalas, PA-C  lactulose (CHRONULAC) 10 GM/15ML solution Take 15 mLs (10 g total) by mouth 2 (two) times daily. 09/19/14  Yes Kathie Dike, MD  lidocaine (XYLOCAINE) 2 % solution 2 TSP  PO QAC AND HS TO PREVENT FOR CHEST PAIN WHILE EATING 07/26/14  Yes Danie Binder, MD  LORazepam (ATIVAN) 1 MG tablet Take 1 tablet (1 mg total) by mouth every 12 (twelve) hours as needed for anxiety. 08/30/14  Yes Lezlie Octave Black, NP  magnesium oxide (MAG-OX) 400 (241.3 MG) MG tablet Take 1 tablet (400 mg total) by mouth daily. 04/27/14  Yes Rexene Alberts, MD  metolazone (ZAROXOLYN) 5 MG tablet Take 2 tablets (10 mg total) by mouth daily. 09/21/14  Yes Orvil Feil, NP  midodrine (PROAMATINE) 10 MG tablet Take 1 tablet (10 mg total) by mouth 2 (two) times daily with a meal. Medication to keep your blood pressure from falling. 04/27/14  Yes Rexene Alberts, MD  Nutritional Supplements (FEEDING SUPPLEMENT, NEPRO CARB STEADY,) LIQD Take 237 mLs by mouth as needed (missed meal during dialysis.). 08/28/14  Yes Lezlie Octave Black, NP  pantoprazole (PROTONIX) 40 MG tablet Take 1 tablet (40 mg total) by mouth 2 (two) times daily. 09/19/14  Yes Kathie Dike, MD  loperamide (IMODIUM) 2 MG capsule Take 1 capsule (2 mg total) by mouth 2 (two) times daily as needed for diarrhea or loose stools. 08/28/14   Radene Gunning, NP  Oxycodone HCl 10 MG TABS Take 1 tablet (10 mg total) by mouth every 6 (six) hours as needed. Patient not taking: Reported on 10/24/2014 09/05/14   Danie Binder, MD  promethazine (PHENERGAN) 12.5 MG tablet Take 1 tablet (12.5 mg total) by mouth every 6 (six) hours as needed for nausea or vomiting. 09/05/14   Danie Binder, MD   BP 104/47 mmHg  Pulse 105  Temp(Src) 98.6 F (37 C) (Oral)  Resp 18  Ht 5\' 2"  (1.575 m)  Wt 150 lb (68.04 kg)  BMI 27.43 kg/m2  SpO2 96% Physical Exam  Constitutional: She is oriented  to person, place, and time.  No active hematemesis noted  HENT:  Head: Normocephalic and atraumatic.  Eyes: Conjunctivae and EOM are normal. Pupils are equal, round, and reactive to light.  Neck: Normal range of motion. Neck supple.  Cardiovascular: Normal rate and regular rhythm.   Pulmonary/Chest: Effort normal and breath sounds normal.  Abdominal: Soft. Bowel sounds are normal.  Minimal epigastric tenderness.  Genitourinary:  Patient declined rectal exam  Musculoskeletal: Normal range of motion.  Neurological: She is alert and oriented to person, place, and time.  Skin: Skin is warm and dry.  Psychiatric: She has a normal mood and affect. Her behavior is normal.  Nursing note and vitals reviewed.   ED Course  Procedures (including critical care time) Labs Review Labs Reviewed  COMPREHENSIVE METABOLIC PANEL - Abnormal; Notable for the following:    BUN 34 (*)    Creatinine, Ser 1.35 (*)  Albumin 3.0 (*)    AST 71 (*)    Total Bilirubin 2.1 (*)    GFR calc non Af Amer 48 (*)    GFR calc Af Amer 56 (*)    All other components within normal limits  CBC WITH DIFFERENTIAL/PLATELET - Abnormal; Notable for the following:    RBC 2.46 (*)    Hemoglobin 8.2 (*)    HCT 25.1 (*)    MCV 102.0 (*)    RDW 16.7 (*)    Platelets 81 (*)    All other components within normal limits  LIPASE, BLOOD  TYPE AND SCREEN    Imaging Review No results found.   EKG Interpretation None     CRITICAL CARE Performed by: [name] Total critical care time: 30 Critical care time was exclusive of separately billable procedures and treating other patients. Critical care was necessary to treat or prevent imminent or life-threatening deterioration. Critical care was time spent personally by me on the following activities: development of treatment plan with patient and/or surrogate as well as nursing, discussions with consultants, evaluation of patient's response to treatment, examination of  patient, obtaining history from patient or surrogate, ordering and performing treatments and interventions, ordering and review of laboratory studies, ordering and review of radiographic studies, pulse oximetry and re-evaluation of patient's condition. MDM   Final diagnoses:  Hematemesis with nausea  ESRD (end stage renal disease) on dialysis  Esophageal varices    Patient has cirrhosis, esophageal varices, end-stage renal disease.  She presents tonight with an upper GI bleed. She is hemodynamically stable. IV fluids IV Zofran, IV protonix.   Admit to general medicine.     [name], MD 10/25/14 2113  [name], MD 10/25/14 2126

## 2014-10-25 NOTE — ED Notes (Signed)
Dr. Rourk at bedside. 

## 2014-10-25 NOTE — Consult Note (Addendum)
Referring Provider: No ref. provider found  Anastasio Champion Primary Care Physician:  [name] Primary Gastroenterologist:  Dr. Oneida Alar  Reason for Consultation:  Hematemesis HPI:   39 year old lady with Wanda [name] cirrhosis, known esophageal varices, recurrent variceal hemorrhage, chronic kidney disease on hemodialysis who came to the emergency department this evening after becoming nauseated and vomited about a cup of gross blood. No further emesis since she's been in the ED. Had 1 dark tarry stool. Blood pressure little soft in the 90 systolic range. H&H 8.2 and 25.1 this evening; was 10.5 and 32.9 4 weeks ago. She's had multiple EGDs with esophageal band ligation. She presented with an upper GI bleed last month. Dr. Laural Golden performed an EGD and found only one small varix with GE junction ulcers-latter felt to be source of bleeding. No banding required. She denies nonsteroidal use. She continues to adamantly deny any alcohol use. She is scheduled to have AV fistula put in for long-term hemodialysis next month.  History of recurrent ascites requiring multiple paracentesis-the last being a couple of weeks ago. History of SBP. In addition, she was scheduled to be seen at the [facility] liver transplant clinic tomorrow. She was evaluated by Dr. Anastasio Champion earlier this evening who called me. She's been started on IV PPI infusion and Sandostatin.    Past Medical History  Diagnosis Date  . GERD (gastroesophageal reflux disease)   . 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 c/w steatohepatitis  . Folate deficiency 09/2013  . Anasarca 10/10/2013  . Hematemesis/vomiting blood 02/24/2014  . Acute blood loss anemia 02/25/2014    Status post transfusion  . Macrocytosis 02/28/2014  . 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 Acute renal failure 09/2013    Pre-renal- resolved  . C. difficile colitis 04/19/2014  . Anxiety   . Depression   .  Thrombocytopenia     Hypercellular bone marrow; abundant megakaryocytes per 08/27/2014; s/p bone marrow bx  . Cirrhosis of liver with ascites   . SBP (spontaneous bacterial peritonitis) 11/10/2013  . Bipolar disorder 12/04/2013    2007-SEEN IN ED FOR INVOLUNTARY COMMITMENT, UDS POS FOR AMPHETAMINES/OPIATES   . PNA (pneumonia) 10/13/2013  . Chronic hypotension   . Gastroesophageal junction ulcer 09/17/2014  . ESRD (end stage renal disease) on dialysis 08/2014    Past Surgical History  Procedure Laterality Date  . None    . Paracentesis  Feb 2015    1180 fluid, negative fluid analysis.   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 Paracentesis  10/2013  . Colonoscopy N/A 12/19/2013    SLF:NO OBVIOUS SOURCE FOR ANEMIA IDETIFIED/ONE COLON POLYP REMOVED/Small internal hemorrhoids  . Esophagogastroduodenoscopy N/A 02/11/2014    Dr. Alin Chavira:Esophageal varices with bleeding stigmata-status post esophageal band ligation therapy. Portal gastropathy  . Esophagogastroduodenoscopy N/A 07/04/2014    RMR: Persiting grade 2 esophageal varicies with bleeding stigmata status post band ligation. Significantly congested gastric mucosa iwith changes constistant with protal gastropathy.   . Esophageal banding  07/04/2014    Procedure: ESOPHAGEAL BANDING;  Surgeon: Daneil Dolin, MD;  Location: [facility];  Service: Endoscopy;;  . Esophagogastroduodenoscopy (egd) with propofol N/A 07/24/2014    SLF:  1. 2 columns grade 2-3 varices- 2 Bands applied.  2.  Moderate gastropathy 3. Duodenal Diverticula  . Esophageal banding N/A 07/24/2014  Procedure: ESOPHAGEAL BANDING (2 bands applied);  Surgeon: Danie Binder, MD;  Location: [facility];  Service: Endoscopy;  Laterality: N/A;  . Central venous catheter insertion Right   . Esophagogastroduodenoscopy N/A 09/16/2014    Procedure:  ESOPHAGOGASTRODUODENOSCOPY (EGD) AT BEDSIDE;  Surgeon: Rogene Houston, MD;  Location: [facility];  Service: Endoscopy;  Laterality: N/A;    Prior to Admission medications   Medication Sig Start Date End Date Taking? Authorizing Provider  folic acid (FOLVITE) 1 MG tablet Take 1 tablet (1 mg total) by mouth daily. 10/02/14  Yes Manon Hilding Kefalas, PA-C  lactulose (CHRONULAC) 10 GM/15ML solution Take 15 mLs (10 g total) by mouth 2 (two) times daily. 09/19/14  Yes Kathie Dike, MD  lidocaine (XYLOCAINE) 2 % solution 2 TSP  PO QAC AND HS TO PREVENT FOR CHEST PAIN WHILE EATING 07/26/14  Yes Danie Binder, MD  LORazepam (ATIVAN) 1 MG tablet Take 1 tablet (1 mg total) by mouth every 12 (twelve) hours as needed for anxiety. 08/30/14  Yes Lezlie Octave Black, NP  magnesium oxide (MAG-OX) 400 (241.3 MG) MG tablet Take 1 tablet (400 mg total) by mouth daily. 04/27/14  Yes Rexene Alberts, MD  metolazone (ZAROXOLYN) 5 MG tablet Take 2 tablets (10 mg total) by mouth daily. 09/21/14  Yes Orvil Feil, NP  midodrine (PROAMATINE) 10 MG tablet Take 1 tablet (10 mg total) by mouth 2 (two) times daily with a meal. Medication to keep your blood pressure from falling. 04/27/14  Yes Rexene Alberts, MD  Nutritional Supplements (FEEDING SUPPLEMENT, NEPRO CARB STEADY,) LIQD Take 237 mLs by mouth as needed (missed meal during dialysis.). 08/28/14  Yes Lezlie Octave Black, NP  pantoprazole (PROTONIX) 40 MG tablet Take 1 tablet (40 mg total) by mouth 2 (two) times daily. 09/19/14  Yes Kathie Dike, MD  loperamide (IMODIUM) 2 MG capsule Take 1 capsule (2 mg total) by mouth 2 (two) times daily as needed for diarrhea or loose stools. 08/28/14   Radene Gunning, NP  Oxycodone HCl 10 MG TABS Take 1 tablet (10 mg total) by mouth every 6 (six) hours as needed. Patient not taking: Reported on 10/24/2014 09/05/14   Danie Binder, MD  promethazine (PHENERGAN) 12.5 MG tablet Take 1 tablet (12.5 mg total) by mouth every 6 (six) hours as needed for nausea or  vomiting. 09/05/14   Danie Binder, MD    Current Facility-Administered Medications  Medication Dose Route Frequency Provider Last Rate Last Dose  . 0.9 %  sodium chloride infusion   Intravenous Once [name], MD      . 0.9 %  sodium chloride infusion   Intravenous STAT [name], MD      . 0.9 %  sodium chloride infusion   Intravenous Continuous Nimish C Gosrani, MD      . 0.9 %  sodium chloride infusion   Intravenous Once Nimish Luther Parody, MD      . octreotide (SANDOSTATIN) 2 mcg/mL load via infusion 50 mcg  50 mcg Intravenous Once Nimish Luther Parody, MD       And  . octreotide (SANDOSTATIN) 500 mcg in sodium chloride 0.9 % 250 mL (2 mcg/mL) infusion  25 mcg/hr Intravenous Continuous Nimish C Gosrani, MD      . ondansetron (ZOFRAN) tablet 4 mg  4 mg Oral Q6H PRN Nimish C Gosrani, MD       Or  . ondansetron (ZOFRAN) injection 4 mg  4 mg Intravenous Q6H PRN Nimish C  Anastasio Champion, MD      . pantoprazole (PROTONIX) 80 mg in sodium chloride 0.9 % 250 mL (0.32 mg/mL) infusion  8 mg/hr Intravenous Continuous Nimish C Gosrani, MD      . sodium chloride 0.9 % injection 3 mL  3 mL Intravenous Q12H Nimish Luther Parody, MD       Current Outpatient Prescriptions  Medication Sig Dispense Refill  . folic acid (FOLVITE) 1 MG tablet Take 1 tablet (1 mg total) by mouth daily. 30 tablet 5  . lactulose (CHRONULAC) 10 GM/15ML solution Take 15 mLs (10 g total) by mouth 2 (two) times daily. 240 mL 0  . lidocaine (XYLOCAINE) 2 % solution 2 TSP  PO QAC AND HS TO PREVENT FOR CHEST PAIN WHILE EATING 500 mL 1  . LORazepam (ATIVAN) 1 MG tablet Take 1 tablet (1 mg total) by mouth every 12 (twelve) hours as needed for anxiety. 15 tablet 0  . magnesium oxide (MAG-OX) 400 (241.3 MG) MG tablet Take 1 tablet (400 mg total) by mouth daily. 30 tablet 3  . metolazone (ZAROXOLYN) 5 MG tablet Take 2 tablets (10 mg total) by mouth daily. 60 tablet 3  . midodrine (PROAMATINE) 10 MG tablet Take 1 tablet (10 mg total) by mouth 2 (two)  times daily with a meal. Medication to keep your blood pressure from falling. 60 tablet 3  . Nutritional Supplements (FEEDING SUPPLEMENT, NEPRO CARB STEADY,) LIQD Take 237 mLs by mouth as needed (missed meal during dialysis.).  0  . pantoprazole (PROTONIX) 40 MG tablet Take 1 tablet (40 mg total) by mouth 2 (two) times daily. 60 tablet 2  . loperamide (IMODIUM) 2 MG capsule Take 1 capsule (2 mg total) by mouth 2 (two) times daily as needed for diarrhea or loose stools. 30 capsule 0  . Oxycodone HCl 10 MG TABS Take 1 tablet (10 mg total) by mouth every 6 (six) hours as needed. (Patient not taking: Reported on 10/24/2014) 25 tablet 0  . promethazine (PHENERGAN) 12.5 MG tablet Take 1 tablet (12.5 mg total) by mouth every 6 (six) hours as needed for nausea or vomiting. 30 tablet 1    Allergies as of 10/25/2014 - Review Complete 10/25/2014  Allergen Reaction Noted  . Lasix [furosemide]  04/12/2014  . Morphine and related Hives and Itching 09/16/2014    Family History  Problem Relation Age of Onset  . Heart disease Mother   . Colon cancer Neg Hx   . Liver disease Neg Hx     History   Social History  . Marital Status: Single    Spouse Name: N/A  . Number of Children: 1  . Years of Education: N/A   Occupational History  . unemployed    Social History Main Topics  . Smoking status: Former Smoker -- 0.25 packs/day for 20 years    Types: Cigarettes    Quit date: 11/05/2008  . Smokeless tobacco: Former Systems developer  . Alcohol Use: No  . Drug Use: No  . Sexual Activity:    Partners: Male    Birth Control/ Protection: None   Other Topics Concern  . Not on file   Social History Narrative    Review of Systems: Gen: Denies any fever, chills, sweats, CV: Denies chest pain, angina, palpitations, syncope, orthopnea, PND,  and claudication. Resp: Denies dyspnea at rest,  cough, sputum, wheezing, coughing up blood, and pleurisy. GI:    Denies dysphagia or odynophagia. Derm: Denies rash,  itching, dry skin, hives, moles, warts, or unhealing  ulcers.   Physical Exam: Vital signs in last 24 hours: Temp:  [98.6 F (37 C)] 98.6 F (37 C) (03/17 1921) Pulse Rate:  [97-114] 97 (03/17 2133) Resp:  [14-18] 14 (03/17 2133) BP: (96-105)/(42-58) 96/55 mmHg (03/17 2130) SpO2:  [94 %-98 %] 98 % (03/17 2133) Weight:  [150 lb (68.04 kg)] 150 lb (68.04 kg) (03/17 1921)   General:   Chronically ill lady who appears somewhat more cachectic than when I last saw her several weeks ago. She is awake and alert appears in no acute distress. Seen in ED room 4. Head:  Normocephalic and atraumatic. Eyes:  Sclera clear, no icterus.   Conjunctiva pink. Ears:  Normal auditory acuity. Nose:  No deformity, discharge,  or lesions. Mouth:  No deformity or lesions, dentition normal. Neck:  Supple; no masses or thyromegaly. Lungs:  Clear throughout to auscultation.   No wheezes, crackles, or rhonchi. No acute distress. Heart:  Regular rate and rhythm; no murmurs, clicks, rubs,  or gallops. Abdomen:  Distended but not tight. Positive bowel sounds. She has fluid wave and shifting dullness. She has no umbilical hernia which is easily reducible but the area is quite tender. Extremities:  Without clubbing or edema. Neurologic:  Alert and  oriented x4;  grossly normal neurologically. Intake/Output from previous day:   Intake/Output this shift:    Lab Results:  Recent Labs  10/25/14 2005  WBC 6.8  HGB 8.2*  HCT 25.1*  PLT 81*   BMET  Recent Labs  10/25/14 2005  NA 138  K 3.5  CL 102  CO2 27  GLUCOSE 92  BUN 34*  CREATININE 1.35*  CALCIUM 8.7   LFT  Recent Labs  10/25/14 2005  PROT 6.4  ALBUMIN 3.0*  AST 71*  ALT 24  ALKPHOS 76  BILITOT 2.1*    Studies/Results: No results found.   Impression:  39 year old lady with decompensated Wanda [name] cirrhosis presenting with hematemesis and melena 2 g drop in hemoglobin. Known esophageal varices with multiple bleeding episodes over the past  year. It somewhat reassuring that her varices look much better at last EGD about 5 weeks ago. However, there still etiology would remain very much in the differential at this time. She remains hemodynamically relatively stable. 2 units of packed blood cells have been ordered.  She is developing recurrent, significant ascites and has a symptomatic umbilical hernia.  She will not make her liver transplant appointment at [facility] tomorrow.  Recommendations: Proceed with an urgent EGD at the bedside in the ICU after intubation has been performed to secure her airway. I discussed this approach at length with the patient in ED room 4 this evening. The risks, benefits, and imponderables have been reviewed. Patient's questions have been answered. She is agreeable.  Agree with IV PPI infusion for now. Agree with Sandostatin bolus and infusion. Will add IV antibiotics as prophylaxis in the setting of cirrhosis and GI bleeding. She may need a repeat paracentesis in the near future.  Overall prognosis is guarded.       Notice:  This dictation was prepared with Dragon dictation along with smaller phrase technology. Any transcriptional errors that result from this process are unintentional and may not be corrected upon review.

## 2014-10-25 NOTE — ED Provider Notes (Signed)
I was asked to place central line in this patient She is awake/alert She has blood pressures ranging from 98 -336 systolic She has two peripheral IV's in place and she is awake/alert    Ripley Fraise, MD 10/25/14 2354

## 2014-10-25 NOTE — ED Notes (Signed)
Vomiting blood, abd pain, black stools onset tonight x 1 hour

## 2014-10-25 NOTE — H&P (Signed)
[facility] History and Physical  Wanda [name] VQM:[id number] DOB: [birth year] DOA: 10/25/2014  Referring physician: ER PCP: [facility]   Chief Complaint: Hematemesis  HPI: Wanda [name] is a 39 y.o. 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 She also says that she has had melena today. She feels somewhat lightheaded but not significantly so. She does have a history of esophageal varices and these of been banded in the past. Her last admission was approximately 5 weeks ago when she presented with a similar admission and at this time the source of the bleeding was suspected from a couple of ulcers in the gastroesophageal junction rather than varices. She apparently was scheduled to be seen by [facility] for liver transplant and she apparently had an appointment tomorrow. She also has end-stage renal disease and is on hemodialysis. Evaluation in the emergency room shows a hemoglobin of 8.2, which is down from 10.5 approximately 5 weeks ago upon discharge. She is now being admitted for further management.  Review of Systems:  Apart from symptoms above, all systems negative.   Past Medical History  Diagnosis Date  . GERD (gastroesophageal reflux disease)   . 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 c/w steatohepatitis  . Folate deficiency 09/2013  . Anasarca 10/10/2013  . Hematemesis/vomiting blood 02/24/2014  . Acute blood loss anemia 02/25/2014    Status post transfusion  . Macrocytosis 02/28/2014  . 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 Acute renal failure 09/2013    Pre-renal- resolved  . C. difficile colitis 04/19/2014  . Anxiety   . Depression   . Thrombocytopenia     Hypercellular bone marrow; abundant megakaryocytes per 08/27/2014; s/p bone marrow bx  . Cirrhosis of  liver with ascites   . SBP (spontaneous bacterial peritonitis) 11/10/2013  . Bipolar disorder 12/04/2013    2007-SEEN IN ED FOR INVOLUNTARY COMMITMENT, UDS POS FOR AMPHETAMINES/OPIATES   . PNA (pneumonia) 10/13/2013  . Chronic hypotension   . Gastroesophageal junction ulcer 09/17/2014  . ESRD (end stage renal disease) on dialysis 08/2014   Past Surgical History  Procedure Laterality Date  . None    . Paracentesis  Feb 2015    1180 fluid, negative fluid analysis.   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 Paracentesis  10/2013  . Colonoscopy N/A 12/19/2013    SLF:NO OBVIOUS SOURCE FOR ANEMIA IDETIFIED/ONE COLON POLYP REMOVED/Small internal hemorrhoids  . Esophagogastroduodenoscopy N/A 02/11/2014    Dr. Rourk:Esophageal varices with bleeding stigmata-status post esophageal band ligation therapy. Portal gastropathy  . Esophagogastroduodenoscopy N/A 07/04/2014    RMR: Persiting grade 2 esophageal varicies with bleeding stigmata status post band ligation. Significantly congested gastric mucosa iwith changes constistant with protal gastropathy.   . Esophageal banding  07/04/2014    Procedure: ESOPHAGEAL BANDING;  Surgeon: Daneil Dolin, MD;  Location: [facility];  Service: Endoscopy;;  . Esophagogastroduodenoscopy (egd) with propofol N/A 07/24/2014    SLF:  1. 2 columns grade 2-3 varices- 2 Bands applied.  2.  Moderate gastropathy 3. Duodenal Diverticula  . Esophageal banding N/A 07/24/2014    Procedure: ESOPHAGEAL BANDING (2 bands applied);  Surgeon: Danie Binder, MD;  Location: [facility];  Service: Endoscopy;  Laterality: N/A;  . Central venous catheter insertion Right   . Esophagogastroduodenoscopy N/A 09/16/2014    Procedure: ESOPHAGOGASTRODUODENOSCOPY (EGD) AT BEDSIDE;  Surgeon: Rogene Houston, MD;  Location: [facility];  Service: Endoscopy;  Laterality: N/A;    Social History:  reports that she quit smoking about 5 years ago. Her smoking use included Cigarettes. She has a 5 pack-year smoking history. She has quit using smokeless tobacco. She reports that she does not drink alcohol or use illicit drugs.  Allergies  Allergen Reactions  . Lasix [Furosemide]     "doesn't work"  . Morphine And Related Hives and Itching    Family History  Problem Relation Age of Onset  . Heart disease Mother   . Colon cancer Neg Hx   . Liver disease Neg Hx     Prior to Admission medications   Medication Sig Start Date End Date Taking? Authorizing Provider  folic acid (FOLVITE) 1 MG tablet Take 1 tablet (1 mg total) by mouth daily. 10/02/14  Yes Manon Hilding Kefalas, PA-C  lactulose (CHRONULAC) 10 GM/15ML solution Take 15 mLs (10 g total) by mouth 2 (two) times daily. 09/19/14  Yes Kathie Dike, MD  lidocaine (XYLOCAINE) 2 % solution 2 TSP  PO QAC AND HS TO PREVENT FOR CHEST PAIN WHILE EATING 07/26/14  Yes Danie Binder, MD  LORazepam (ATIVAN) 1 MG tablet Take 1 tablet (1 mg total) by mouth every 12 (twelve) hours as needed for anxiety. 08/30/14  Yes Lezlie Octave Black, NP  magnesium oxide (MAG-OX) 400 (241.3 MG) MG tablet Take 1 tablet (400 mg total) by mouth daily. 04/27/14  Yes Rexene Alberts, MD  metolazone (ZAROXOLYN) 5 MG tablet Take 2 tablets (10 mg total) by mouth daily. 09/21/14  Yes Orvil Feil, NP  midodrine (PROAMATINE) 10 MG tablet Take 1 tablet (10 mg total) by mouth 2 (two) times daily with a meal. Medication to keep your blood pressure from falling. 04/27/14  Yes Rexene Alberts, MD  Nutritional Supplements (FEEDING SUPPLEMENT, NEPRO CARB STEADY,) LIQD Take 237 mLs by mouth as needed (missed meal during dialysis.). 08/28/14  Yes Lezlie Octave Black, NP  pantoprazole (PROTONIX) 40 MG tablet Take 1 tablet (40 mg total) by mouth 2 (two) times daily. 09/19/14  Yes Kathie Dike, MD  loperamide (IMODIUM) 2 MG capsule Take 1 capsule (2 mg total) by mouth 2 (two) times daily as  needed for diarrhea or loose stools. 08/28/14   Radene Gunning, NP  Oxycodone HCl 10 MG TABS Take 1 tablet (10 mg total) by mouth every 6 (six) hours as needed. Patient not taking: Reported on 10/24/2014 09/05/14   Danie Binder, MD  promethazine (PHENERGAN) 12.5 MG tablet Take 1 tablet (12.5 mg total) by mouth every 6 (six) hours as needed for nausea or vomiting. 09/05/14   Danie Binder, MD   Physical Exam: Filed Vitals:   10/25/14 2030 10/25/14 2100 10/25/14 2130 10/25/14 2133  BP: 99/42 104/47 96/55   Pulse: 105   97  Temp:      TempSrc:      Resp:    14  Height:      Weight:      SpO2: 96%   98%    Wt Readings from Last 3 Encounters:  10/25/14 68.04 kg (150 lb)  10/24/14 68.04 kg (150 lb)  09/18/14 54.25 kg (119 lb 9.6 oz)    General:  Appears chronically sick. She looks pale.  Her peripheries are well perfused despite her soft blood pressure. Eyes: PERRL, normal lids, irises & conjunctiva ENT: grossly normal hearing, lips & tongue Neck: no LAD, masses or thyromegaly Cardiovascular: RRR, no m/r/g. No LE edema. Telemetry: SR, no arrhythmias  Respiratory: CTA bilaterally, no w/r/r. Normal respiratory effort. Abdomen: soft, distended, consistent with ascites. Skin: no rash or induration seen on limited exam Musculoskeletal: grossly normal tone BUE/BLE Psychiatric: grossly normal mood and affect, speech fluent and appropriate Neurologic: grossly non-focal. she is alert and orientated. There is no clinical evidence of hepatic encephalopathy.           Labs on Admission:  Basic Metabolic Panel:  Recent Labs Lab 10/25/14 2005  NA 138  K 3.5  CL 102  CO2 27  GLUCOSE 92  BUN 34*  CREATININE 1.35*  CALCIUM 8.7   Liver Function Tests:  Recent Labs Lab 10/25/14 2005  AST 71*  ALT 24  ALKPHOS 76  BILITOT 2.1*  PROT 6.4  ALBUMIN 3.0*    Recent Labs Lab 10/25/14 2005  LIPASE 82*   No results for input(s): AMMONIA in the last 168 hours. CBC:  Recent  Labs Lab 10/25/14 2005  WBC 6.8  NEUTROABS 5.2  HGB 8.2*  HCT 25.1*  MCV 102.0*  PLT 81*   Cardiac Enzymes: No results for input(s): CKTOTAL, CKMB, CKMBINDEX, TROPONINI in the last 168 hours.  BNP (last 3 results) No results for input(s): BNP in the last 8760 hours.  ProBNP (last 3 results) No results for input(s): PROBNP in the last 8760 hours.  CBG: No results for input(s): GLUCAP in the last 168 hours.  Radiological Exams on Admission: No results found.    Assessment/Plan Active Problems:   Hematemesis   Esophageal varices   Anemia associated with acute blood loss   Cirrhosis of liver with ascites   1. Hematemesis. The worry is that she is having esophageal variceal bleeding. We will start her on octreotide infusion as well as Protonix infusion. Her hemoglobin is 8.2 and I will start with blood transfusion of 2 units. She will be nothing by mouth. She is going to be admitted to the stepdown unit. I have spoken with gastroenterology, Dr. Sydell Axon, who is going to come in this evening to see her. 2. Anemia associated with acute blood loss. We will transfuse her 2 units of blood. 3. Decompensated liver cirrhosis with ascites. She did have paracentesis a few weeks ago. She may need another one.  Further recommendations will depend on patient's hospital progress.   Code Status: Full code.   DVT Prophylaxis: SCDs.  Family Communication: I discussed the plan with the patient at the bedside  Disposition Plan: Home when medically stable.   Time spent: 60 minutes.  Doree Albee [facility] Pager [phone].

## 2014-10-26 ENCOUNTER — Inpatient Hospital Stay (HOSPITAL_COMMUNITY): Payer: Medicare Other | Admitting: Anesthesiology

## 2014-10-26 ENCOUNTER — Encounter (HOSPITAL_COMMUNITY): Payer: Self-pay | Admitting: Anesthesiology

## 2014-10-26 ENCOUNTER — Inpatient Hospital Stay (HOSPITAL_COMMUNITY): Payer: Medicare Other

## 2014-10-26 ENCOUNTER — Encounter (HOSPITAL_COMMUNITY)
Admission: EM | Disposition: A | Discharge status: Home / Self Care | Payer: Self-pay | Source: Home / Self Care | Attending: Internal Medicine

## 2014-10-26 DIAGNOSIS — Z978 Presence of other specified devices: Secondary | ICD-10-CM | POA: Insufficient documentation

## 2014-10-26 DIAGNOSIS — Z789 Other specified health status: Secondary | ICD-10-CM

## 2014-10-26 DIAGNOSIS — K920 Hematemesis: Secondary | ICD-10-CM | POA: Insufficient documentation

## 2014-10-26 DIAGNOSIS — Z992 Dependence on renal dialysis: Secondary | ICD-10-CM

## 2014-10-26 DIAGNOSIS — K3189 Other diseases of stomach and duodenum: Secondary | ICD-10-CM

## 2014-10-26 DIAGNOSIS — N186 End stage renal disease: Secondary | ICD-10-CM

## 2014-10-26 HISTORY — PX: ESOPHAGOGASTRODUODENOSCOPY (EGD) WITH PROPOFOL: SHX5813

## 2014-10-26 LAB — COMPREHENSIVE METABOLIC PANEL
ALT: 21 U/L (ref 0–35)
AST: 67 U/L — AB (ref 0–37)
Albumin: 3 g/dL — ABNORMAL LOW (ref 3.5–5.2)
Alkaline Phosphatase: 75 U/L (ref 39–117)
Anion gap: 11 (ref 5–15)
BILIRUBIN TOTAL: 3.5 mg/dL — AB (ref 0.3–1.2)
BUN: 42 mg/dL — AB (ref 6–23)
CALCIUM: 8.8 mg/dL (ref 8.4–10.5)
CHLORIDE: 104 mmol/L (ref 96–112)
CO2: 24 mmol/L (ref 19–32)
CREATININE: 1.96 mg/dL — AB (ref 0.50–1.10)
GFR, EST AFRICAN AMERICAN: 36 mL/min — AB (ref >90)
GFR, EST NON AFRICAN AMERICAN: 31 mL/min — AB (ref >90)
Glucose, Bld: 92 mg/dL (ref 70–99)
Potassium: 3.4 mmol/L — ABNORMAL LOW (ref 3.5–5.1)
Sodium: 139 mmol/L (ref 135–145)
Total Protein: 6.2 g/dL (ref 6.0–8.3)

## 2014-10-26 LAB — BLOOD GAS, ARTERIAL
Acid-Base Excess: 0.7 mmol/L (ref 0.0–2.0)
Bicarbonate: 23.1 mEq/L (ref 20.0–24.0)
Drawn by: 21310
FIO2: 50 %
O2 Saturation: 99.8 %
PATIENT TEMPERATURE: 37
PEEP: 5 cmH2O
PH ART: 7.542 — AB (ref 7.350–7.450)
RATE: 15 resp/min
TCO2: 20.5 mmol/L (ref 0–100)
VT: 500 mL
pCO2 arterial: 27 mmHg — ABNORMAL LOW (ref 35.0–45.0)
pO2, Arterial: 209 mmHg — ABNORMAL HIGH (ref 80.0–100.0)

## 2014-10-26 LAB — CBC
HEMATOCRIT: 30.5 % — AB (ref 36.0–46.0)
HEMOGLOBIN: 10.1 g/dL — AB (ref 12.0–15.0)
MCH: 32.6 pg (ref 26.0–34.0)
MCHC: 33.1 g/dL (ref 30.0–36.0)
MCV: 98.4 fL (ref 78.0–100.0)
Platelets: 74 10*3/uL — ABNORMAL LOW (ref 150–400)
RBC: 3.1 MIL/uL — AB (ref 3.87–5.11)
RDW: 18.1 % — AB (ref 11.5–15.5)
WBC: 6.7 10*3/uL (ref 4.0–10.5)

## 2014-10-26 LAB — TRIGLYCERIDES: Triglycerides: 164 mg/dL — ABNORMAL HIGH (ref <150)

## 2014-10-26 LAB — MRSA PCR SCREENING: MRSA by PCR: NEGATIVE

## 2014-10-26 SURGERY — ESOPHAGOGASTRODUODENOSCOPY (EGD) WITH PROPOFOL
Anesthesia: Monitor Anesthesia Care

## 2014-10-26 MED ORDER — CIPROFLOXACIN IN D5W 400 MG/200ML IV SOLN
400.0000 mg | Freq: Once | INTRAVENOUS | Status: AC
  Administered 2014-10-26: 400 mg via INTRAVENOUS
  Filled 2014-10-26: qty 200

## 2014-10-26 MED ORDER — MIDAZOLAM HCL 5 MG/5ML IJ SOLN
INTRAMUSCULAR | Status: DC | PRN
  Administered 2014-10-26 (×3): 2 mg via INTRAVENOUS
  Administered 2014-10-26: 1 mg via INTRAVENOUS

## 2014-10-26 MED ORDER — CEFTRIAXONE SODIUM IN DEXTROSE 40 MG/ML IV SOLN
2.0000 g | INTRAVENOUS | Status: DC
  Filled 2014-10-26 (×4): qty 50

## 2014-10-26 MED ORDER — PROPOFOL 10 MG/ML IV EMUL
INTRAVENOUS | Status: AC
  Administered 2014-10-26: 1000 mg via INTRAVENOUS
  Filled 2014-10-26: qty 100

## 2014-10-26 MED ORDER — SUCCINYLCHOLINE CHLORIDE 20 MG/ML IJ SOLN
INTRAMUSCULAR | Status: DC | PRN
  Administered 2014-10-26: 100 mg via INTRAVENOUS

## 2014-10-26 MED ORDER — SODIUM CHLORIDE 0.9 % IV SOLN
INTRAVENOUS | Status: DC
  Administered 2014-10-26 – 2014-10-27 (×2): via INTRAVENOUS

## 2014-10-26 MED ORDER — SODIUM CHLORIDE 0.9 % IV SOLN: INTRAVENOUS | Status: DC

## 2014-10-26 MED ORDER — ONDANSETRON HCL 4 MG/2ML IJ SOLN
4.0000 mg | Freq: Three times a day (TID) | INTRAMUSCULAR | Status: DC
  Administered 2014-10-26 – 2014-11-01 (×16): 4 mg via INTRAVENOUS
  Filled 2014-10-26 (×14): qty 2

## 2014-10-26 MED ORDER — FENTANYL CITRATE 0.05 MG/ML IJ SOLN
100.0000 ug | INTRAMUSCULAR | Status: DC | PRN
  Administered 2014-10-26 (×2): 100 ug via INTRAVENOUS
  Filled 2014-10-26 (×2): qty 2

## 2014-10-26 MED ORDER — ALBUMIN HUMAN 25 % IV SOLN
25.0000 g | Freq: Once | INTRAVENOUS | Status: AC
  Administered 2014-10-26: 25 g via INTRAVENOUS
  Filled 2014-10-26: qty 100

## 2014-10-26 MED ORDER — OXYCODONE HCL 5 MG PO TABS
10.0000 mg | ORAL_TABLET | Freq: Four times a day (QID) | ORAL | Status: DC | PRN
  Administered 2014-10-26 – 2014-11-01 (×22): 10 mg via ORAL
  Filled 2014-10-26 (×23): qty 2

## 2014-10-26 MED ORDER — BUMETANIDE 0.25 MG/ML IJ SOLN
1.0000 mg | Freq: Two times a day (BID) | INTRAMUSCULAR | Status: DC
  Administered 2014-10-26 – 2014-10-28 (×5): 1 mg via INTRAVENOUS
  Filled 2014-10-26 (×5): qty 4

## 2014-10-26 MED ORDER — PROPOFOL 10 MG/ML IV EMUL
0.0000 ug/kg/min | INTRAVENOUS | Status: DC
  Administered 2014-10-26 (×2): 50 ug/kg/min via INTRAVENOUS
  Filled 2014-10-26: qty 100

## 2014-10-26 MED ORDER — CIPROFLOXACIN HCL 250 MG PO TABS
500.0000 mg | ORAL_TABLET | ORAL | Status: DC
  Administered 2014-10-27 – 2014-11-01 (×6): 500 mg via ORAL
  Filled 2014-10-26 (×6): qty 2

## 2014-10-26 MED ORDER — DEXTROSE 5 % IV SOLN
2.0000 g | INTRAVENOUS | Status: DC
  Administered 2014-10-26: 2 g via INTRAVENOUS
  Filled 2014-10-26 (×4): qty 2

## 2014-10-26 MED ORDER — ETOMIDATE 2 MG/ML IV SOLN
INTRAVENOUS | Status: DC | PRN
Start: 2014-10-26 — End: 2014-10-26
  Administered 2014-10-26: 10 mg via INTRAVENOUS

## 2014-10-26 MED ORDER — FOLIC ACID 1 MG PO TABS
1.0000 mg | ORAL_TABLET | Freq: Every day | ORAL | Status: DC
  Administered 2014-10-26 – 2014-11-01 (×7): 1 mg via ORAL
  Filled 2014-10-26 (×7): qty 1

## 2014-10-26 MED ORDER — PANTOPRAZOLE SODIUM 40 MG IV SOLR
40.0000 mg | Freq: Two times a day (BID) | INTRAVENOUS | Status: DC
  Administered 2014-10-26 (×2): 40 mg via INTRAVENOUS
  Filled 2014-10-26 (×2): qty 40

## 2014-10-26 MED ORDER — LORAZEPAM 0.5 MG PO TABS
1.0000 mg | ORAL_TABLET | Freq: Two times a day (BID) | ORAL | Status: DC | PRN
Start: 2014-10-26 — End: 2014-11-01
  Administered 2014-10-30 – 2014-11-01 (×4): 1 mg via ORAL
  Filled 2014-10-26 (×5): qty 2

## 2014-10-26 MED ORDER — PANTOPRAZOLE SODIUM 40 MG PO TBEC
40.0000 mg | DELAYED_RELEASE_TABLET | Freq: Two times a day (BID) | ORAL | Status: DC
  Administered 2014-10-26 – 2014-11-01 (×12): 40 mg via ORAL
  Filled 2014-10-26 (×12): qty 1

## 2014-10-26 MED ORDER — CHLORHEXIDINE GLUCONATE 0.12 % MT SOLN
15.0000 mL | Freq: Four times a day (QID) | OROMUCOSAL | Status: DC
  Administered 2014-10-26 – 2014-10-27 (×7): 15 mL via OROMUCOSAL
  Filled 2014-10-26 (×8): qty 15

## 2014-10-26 NOTE — Care Management Note (Addendum)
    Page 1 of 1   11/01/2014     1:01:39 PM CARE MANAGEMENT NOTE 11/01/2014  Patient:  Wanda [name], Wanda [name]   Account Number:  [id number]  Date Initiated:  10/26/2014  Documentation initiated by:  Jolene Provost  Subjective/Objective Assessment:   Pt is from home, lives with boyfriend. Pt has can, walker, bsc, showerchair at home. Pt has no HH services. Pt is on HD at [facility] in [place]. Pt goes to the [facility] for PCP care. Pt plans to discharge home with self care. No CM needs     Action/Plan:   Anticipated DC Date:  10/29/2014   Anticipated DC Plan:  [place]  CM consult      Choice offered to / List presented to:             Status of service:  Completed, signed off Medicare Important Message given?  YES (If response is "NO", the following Medicare IM given date fields will be blank) Date Medicare IM given:  10/26/2014 Medicare IM given by:  Jolene Provost Date Additional Medicare IM given:  11/01/2014 Additional Medicare IM given by:  JESSICA CHILDRESS  Discharge Disposition:  HOME/SELF CARE  Per UR Regulation:  Reviewed for med. necessity/level of care/duration of stay  If discussed at [place] of Stay Meetings, dates discussed:   10/30/2014  11/01/2014    Comments:  11/01/2014 [place], RN, MSN, CM Pt discharged home with self care today. No CM needs.  10/30/2014 [place], RN, MSN, CM  10/26/2014 [place], RN, MSN, CM

## 2014-10-26 NOTE — Procedures (Signed)
Extubation Procedure Note  Patient Details:   Name: Wanda [name] DOB: 30-Nov-1975 MRN: [id number]   Airway Documentation:  Airway 7 mm (Active)  Secured at (cm) 21 cm 10/26/2014  8:00 AM  Measured From Lips 10/26/2014  8:00 AM  Secured Location Right 10/26/2014  8:00 AM  Secured By [employer] 10/26/2014  8:00 AM  Tube Holder Repositioned Yes 10/26/2014  6:54 AM  Cuff Pressure (cm H2O) 26 cm H2O 10/26/2014 12:30 AM  Site Condition Dry 10/26/2014  6:54 AM   Patient extubated to 3 LPM nasal cannula, saturation is 100%.  Pt tolerated extubation well, will continue to monitor.  Evaluation  O2 sats: stable throughout Complications: No apparent complications Patient did tolerate procedure well. Bilateral Breath Sounds: Clear Suctioning: Oral, Airway Yes  Oren Bracket 10/26/2014, 9:18 AM

## 2014-10-26 NOTE — Progress Notes (Signed)
eLink Physician-Brief Progress Note Patient Name: Wanda [name] DOB: 04-29-1976 MRN: [id number]   Date of Service  10/26/2014  HPI/Events of Note  Called for ventilator orders and ABG.  eICU Interventions  Will order: 1. Ventilator settings: 50%/PRVC 15/TV 500/P 5 2. ABG now.     Intervention Category Major Interventions: Respiratory failure - evaluation and management  Sommer,Steven Eugene 10/26/2014, 3:47 AM

## 2014-10-26 NOTE — Care Management Utilization Note (Signed)
UR completed 

## 2014-10-26 NOTE — Op Note (Signed)
[facility] [address] [place], [address]   ENDOSCOPY PROCEDURE REPORT  PATIENT: Timya, Trimmer  MR#: [id number] BIRTHDATE: March 23, 1976 , 38  yrs. old GENDER: female ENDOSCOPIST: R.  Garfield Cornea, MD [facility] REFERRED BY:     Dr. Anastasio Champion PROCEDURE DATE:  November 14, 2014 PROCEDURE:  EGD w/ band ligation of varices INDICATIONS:  hematemesis; history of variceal hemorrhage. MEDICATIONS: titrated fentanyl, Propofol,  and Versed IV ASA CLASS:      Class IV  CONSENT: The risks, benefits, limitations, alternatives and imponderables have been discussed.  The potential for biopsy, esophogeal dilation, etc. have also been reviewed.  Questions have been answered.  All parties agreeable.  Please see the history and physical in the medical record for more information.  DESCRIPTION OF PROCEDURE: After the risks benefits and alternatives of the procedure were thoroughly explained, informed consent was obtained.  The    endoscope was introduced through the mouth and advanced to the second portion of the duodenum , limited by Without limitations. The instrument was slowly withdrawn as the mucosa was fully examined.    2 short columns grade 2 esophageal varices - distal esophagus.  No definite bleeding stigmata.  Overlying esophageal mucosa appeared normal; stomach contained mucus and a little bit of fresh blood /residue clot - easily washed away.  Gastric mucosa diffusely congested with friability snakeskin/fish scale appearance.  Marked friability the gastric mucosa disease with gentle trauma from the gastroscope.  Small hiatal hernia present.  No gastric varices.  No ulcerative infiltrating process no active bleeding.  Patent pylorus.  Duodenal bulb and second portion duodenal mucosa appeared normal. No actively bleeding lesions seen in his upper GI tract although gastric mucosa was ooozy diffusely. I elected to go ahead and obliterate the remaining of varices. Scope  was withdrawn and the Microvasive 7 shot bander was loaded up.  Scope was reintroduced into the distal esophagus and 1 band was placed on each column.  Good hemostasis maintained with these maneuvers.  Retroflexed views revealed a hiatal hernia.     The scope was then withdrawn from the patient and the procedure completed.  COMPLICATIONS: There were no immediate complications.  ENDOSCOPIC IMPRESSION: 2 columns of grade 2 esophageal varices?" without obvious bleeding stigmata - status post band ligation to complete obliteration of remaining varices.  Markedly congested gastric mucosa with changes of advanced portal gastropathy. In the setting of heaving/vomiting, I suspect blood came from diffuse gastric mucosal hemorrhage.  RECOMMENDATIONS: Continue IV Sandostatin. Discontinue PPI infusion and start Protonix 40 mg twice daily. Continue prophylactic IV antibiotics?"renal dosing. Follow hemoglobin. Continue  propofol / fentanyl ICU rotocol.  eventual extubation over the next 12-24 hours. Follow hemoglobin. My findings and recommendations were discussed with Dr. Nicoletta Dress at length.    REPEAT EXAM:  eSigned:  R. Garfield Cornea, MD Rosalita Chessman [facility] 14-Nov-2014 1:40 AM    CC:  CPT CODES: ICD CODES:  The ICD and CPT codes recommended by this software are interpretations from the data that the clinical staff has captured with the software.  The verification of the translation of this report to the ICD and CPT codes and modifiers is the sole responsibility of the health care institution and practicing physician where this report was generated.  [employer] will not be held responsible for the validity of the ICD and CPT codes included on this report.  AMA assumes no liability for data contained or not contained herein. CPT is a Designer, television/film set of the [employer]  [employer].  PATIENT NAME:  Sandria, Mcenroe MR#: [id number]

## 2014-10-26 NOTE — Progress Notes (Addendum)
ANTIBIOTIC CONSULT NOTE  Pharmacy Consult for Rocephin  Indication: SBP  Allergies  Allergen Reactions  . Lasix [Furosemide]     "doesn't work"  . Morphine And Related Hives and Itching    Patient Measurements: Height: 5' (152.4 cm) Weight: 119 lb 11.4 oz (54.3 kg) IBW/kg (Calculated) : 45.5  Vital Signs: Temp: 98.7 F (37.1 C) (03/18 0759) Temp Source: Axillary (03/18 0759) BP: 133/79 mmHg (03/18 0759) Pulse Rate: 91 (03/18 0800) Intake/Output from previous day: 03/17 0701 - 03/18 0700 In: 1031.5 [I.V.:484; Blood:547.5] Out: 300 [Emesis/NG output:300] Intake/Output from this shift:    Labs:  Recent Labs  10/25/14 2005 10/26/14 0834  WBC 6.8 6.7  HGB 8.2* 10.1*  PLT 81* 74*  CREATININE 1.35* 1.96*   Estimated Creatinine Clearance: 27.4 mL/min (by C-G formula based on Cr of 1.96). No results for input(s): VANCOTROUGH, VANCOPEAK, VANCORANDOM, GENTTROUGH, GENTPEAK, GENTRANDOM, TOBRATROUGH, TOBRAPEAK, TOBRARND, AMIKACINPEAK, AMIKACINTROU, AMIKACIN in the last 72 hours.   Microbiology: Recent Results (from the past 720 hour(s))  MRSA PCR Screening     Status: None   Collection Time: 10/26/14  1:20 AM  Result Value Ref Range Status   MRSA by PCR NEGATIVE NEGATIVE Final    Comment:        The GeneXpert MRSA Assay (FDA approved for NASAL specimens only), is one component of a comprehensive MRSA colonization surveillance program. It is not intended to diagnose MRSA infection nor to guide or monitor treatment for MRSA infections.     Anti-infectives    Start     Dose/Rate Route Frequency Ordered Stop   10/26/14 1200  cefTRIAXone (ROCEPHIN) 2 g in dextrose 5 % 50 mL IVPB - Premix     2 g 100 mL/hr over 30 Minutes Intravenous Every 24 hours 10/26/14 1041     10/26/14 0800  ciprofloxacin (CIPRO) IVPB 400 mg     400 mg 200 mL/hr over 60 Minutes Intravenous  Once 10/26/14 0433 10/26/14 0913   10/25/14 2345  ciprofloxacin (CIPRO) IVPB 400 mg  Status:   Discontinued     400 mg 200 mL/hr over 60 Minutes Intravenous  Once 10/25/14 2339 10/26/14 0433     Assessment: 39 yo F with hx ESRD requiring HD, cirrhosis who was admitted with GI Bleed.  She is currently intubated.  Pharmacy asked to dose Rocephin for SBP.    Rocephin 3/18>>  Goal of Therapy:  Eradicate infection.  Plan:  Rocephin 2gm IV q24h Monitor clinical course, cx data Duration of therapy per MD- recommend 5 days  Ashantae Pangallo, Lavonia Drafts 10/26/2014,10:42 AM  Addendum: Rocephin changed back to Cipro for SBP px.  Dose renally adjusted to q24h for patient with ESRD. Pharmacy will sign off.  Please re-consult if needed.   Netta Cedars, PharmD, BCPS 10/26/2014@1 :29 PM

## 2014-10-26 NOTE — Progress Notes (Signed)
eLink Physician-Brief Progress Note Patient Name: SCOTLYNN NOYES DOB: 03/29/1976 MRN: [id number]   Date of Service  10/26/2014  HPI/Events of Note  Patient intubated s/p endoscopy. Nurse requests oral care, chlorhexidine, insert foley catheter and foley care.   eICU Interventions  All above ordered.     Intervention Category Minor Interventions: Routine modifications to care plan (e.g. PRN medications for pain, fever)  Sommer,Steven Eugene 10/26/2014, 3:20 AM

## 2014-10-26 NOTE — Anesthesia Procedure Notes (Signed)
Procedure Name: Intubation Date/Time: 10/26/2014 12:27 AM Performed by: Andree Elk, Skyley Grandmaison A Pre-anesthesia Checklist: Patient identified, Emergency Drugs available, Suction available, Patient being monitored and Timeout performed Patient Re-evaluated:Patient Re-evaluated prior to inductionOxygen Delivery Method: Ambu bag Preoxygenation: Pre-oxygenation with 100% oxygen Intubation Type: IV induction, Rapid sequence and Cricoid Pressure applied Laryngoscope Size: Miller and 3 Grade View: Grade I Tube type: Subglottic suction tube Tube size: 7.0 mm Number of attempts: 1 Placement Confirmation: ETT inserted through vocal cords under direct vision,  positive ETCO2 and breath sounds checked- equal and bilateral (Stat portable CXR ordered) Secured at: 21 cm Tube secured with: Tape Dental Injury: Teeth and Oropharynx as per pre-operative assessment

## 2014-10-26 NOTE — Consult Note (Signed)
Consult requested by: Dr. Wyline Copas Consult requested for ventilator management/respiratory failure:  HPI: This is a 39 year old who is known to have cirrhosis from [place] and who came to the emergency department after having some vomiting of bright red blood. She was intubated for airway protection. She has a history of pneumonia but no history of lung disease. The history is obtained from the medical record since she is intubated and there is no family available. She is apparently being considered for liver transplant. Her endoscopy showed that she had varices and also diffuse gastritis.  Past Medical History  Diagnosis Date  . GERD (gastroesophageal reflux disease)   . 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 c/w steatohepatitis  . Folate deficiency 09/2013  . Anasarca 10/10/2013  . Hematemesis/vomiting blood 02/24/2014  . Acute blood loss anemia 02/25/2014    Status post transfusion  . Macrocytosis 02/28/2014  . 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 Acute renal failure 09/2013    Pre-renal- resolved  . C. difficile colitis 04/19/2014  . Anxiety   . Depression   . Thrombocytopenia     Hypercellular bone marrow; abundant megakaryocytes per 08/27/2014; s/p bone marrow bx  . Cirrhosis of liver with ascites   . SBP (spontaneous bacterial peritonitis) 11/10/2013  . Bipolar disorder 12/04/2013    2007-SEEN IN ED FOR INVOLUNTARY COMMITMENT, UDS POS FOR AMPHETAMINES/OPIATES   . PNA (pneumonia) 10/13/2013  . Chronic hypotension   . Gastroesophageal junction ulcer 09/17/2014  . ESRD (end stage renal disease) on dialysis 08/2014     Family History  Problem Relation Age of Onset  . Heart disease Mother   . Colon cancer Neg Hx   . Liver disease Neg Hx      History   Social History  . Marital Status: Single    Spouse Name: N/A  . Number of Children: 1  . Years of Education: N/A   Occupational History  . unemployed    Social History Main  Topics  . Smoking status: Former Smoker -- 0.25 packs/day for 20 years    Types: Cigarettes    Quit date: 11/05/2008  . Smokeless tobacco: Former Systems developer  . Alcohol Use: No  . Drug Use: No  . Sexual Activity:    Partners: Male    Birth Control/ Protection: None   Other Topics Concern  . None   Social History Narrative     ROS: Unobtainable    Objective: Vital signs in last 24 hours: Temp:  [97.7 F (36.5 C)-98.6 F (37 C)] 98 F (36.7 C) (03/18 0545) Pulse Rate:  [83-134] 90 (03/18 0700) Resp:  [11-30] 13 (03/18 0700) BP: (96-154)/(42-104) 116/72 mmHg (03/18 0700) SpO2:  [93 %-100 %] 100 % (03/18 0700) FiO2 (%):  [40 %-50 %] 40 % (03/18 0654) Weight:  [54.3 kg (119 lb 11.4 oz)-68.4 kg (150 lb 12.7 oz)] 54.3 kg (119 lb 11.4 oz) (03/18 0545) Weight change:     Intake/Output from previous day: 03/17 0701 - 03/18 0700 In: 1031.5 [I.V.:484; Blood:547.5] Out: 300 [Emesis/NG output:300]  PHYSICAL EXAM Intubated sedated but will open her eyes. Pupils are reactive nodes and throat are clear her chest is clear. Heart is regular without gallop. Her abdomen is mildly tender but soft. Extremities showed no edema. Central nervous system exam she does move all 4 extremities.  Lab Results: Basic Metabolic Panel:  Recent Labs  10/25/14 2005  NA 138  K 3.5  CL  102  CO2 27  GLUCOSE 92  BUN 34*  CREATININE 1.35*  CALCIUM 8.7   Liver Function Tests:  Recent Labs  10/25/14 2005  AST 71*  ALT 24  ALKPHOS 76  BILITOT 2.1*  PROT 6.4  ALBUMIN 3.0*    Recent Labs  10/25/14 2005  LIPASE 82*   No results for input(s): AMMONIA in the last 72 hours. CBC:  Recent Labs  10/25/14 2005  WBC 6.8  NEUTROABS 5.2  HGB 8.2*  HCT 25.1*  MCV 102.0*  PLT 81*   Cardiac Enzymes: No results for input(s): CKTOTAL, CKMB, CKMBINDEX, TROPONINI in the last 72 hours. BNP: No results for input(s): PROBNP in the last 72 hours. D-Dimer: No results for input(s): DDIMER in the last  72 hours. CBG: No results for input(s): GLUCAP in the last 72 hours. Hemoglobin A1C: No results for input(s): HGBA1C in the last 72 hours. Fasting Lipid Panel:  Recent Labs  10/26/14 0122  TRIG 164*   Thyroid Function Tests: No results for input(s): TSH, T4TOTAL, FREET4, T3FREE, THYROIDAB in the last 72 hours. Anemia Panel: No results for input(s): VITAMINB12, FOLATE, FERRITIN, TIBC, IRON, RETICCTPCT in the last 72 hours. Coagulation:  Recent Labs  10/25/14 2243  LABPROT 15.4*  INR 1.21   Urine Drug Screen: Drugs of Abuse     Component Value Date/Time   LABOPIA NONE DETECTED 05/14/2014 1338   COCAINSCRNUR NONE DETECTED 05/14/2014 1338   LABBENZ NONE DETECTED 05/14/2014 1338   AMPHETMU NONE DETECTED 05/14/2014 1338   THCU NONE DETECTED 05/14/2014 1338   LABBARB NONE DETECTED 05/14/2014 1338    Alcohol Level: No results for input(s): ETH in the last 72 hours. Urinalysis: No results for input(s): COLORURINE, LABSPEC, PHURINE, GLUCOSEU, HGBUR, BILIRUBINUR, KETONESUR, PROTEINUR, UROBILINOGEN, NITRITE, LEUKOCYTESUR in the last 72 hours.  Invalid input(s): APPERANCEUR Misc. Labs:   ABGS:  Recent Labs  10/26/14 0430  PHART 7.542*  PO2ART 209.0*  TCO2 20.5  HCO3 23.1     MICROBIOLOGY: Recent Results (from the past 240 hour(s))  MRSA PCR Screening     Status: None   Collection Time: 10/26/14  1:20 AM  Result Value Ref Range Status   MRSA by PCR NEGATIVE NEGATIVE Final    Comment:        The GeneXpert MRSA Assay (FDA approved for NASAL specimens only), is one component of a comprehensive MRSA colonization surveillance program. It is not intended to diagnose MRSA infection nor to guide or monitor treatment for MRSA infections.     Studies/Results: Dg Chest 1 View  10/26/2014   CLINICAL DATA:  Endotracheal tube placement.  Initial encounter.  EXAM: CHEST  1 VIEW  COMPARISON:  Chest radiograph performed 07/04/2014  FINDINGS: The patient's endotracheal  tube is seen ending 2 cm above the carina. The right IJ dual-lumen catheter is seen ending within the right atrium.  The lungs are well-aerated. Vascular congestion is noted, with mildly increased interstitial markings, possibly reflecting mild interstitial edema. There is no evidence of pleural effusion or pneumothorax.  The cardiomediastinal silhouette is within normal limits. No acute osseous abnormalities are seen.  IMPRESSION: 1. Endotracheal tube seen ending 2 cm above the carina. 2. Vascular congestion, with mildly increased interstitial markings, possibly reflecting mild interstitial edema.   Electronically Signed   By: Garald Balding M.D.   On: 10/26/2014 01:43    Medications:  Prior to Admission:  Prescriptions prior to admission  Medication Sig Dispense Refill Last Dose  . folic acid (FOLVITE) 1  MG tablet Take 1 tablet (1 mg total) by mouth daily. 30 tablet 5 10/24/2014 at Unknown time  . lactulose (CHRONULAC) 10 GM/15ML solution Take 15 mLs (10 g total) by mouth 2 (two) times daily. 240 mL 0 Past Week at Unknown time  . lidocaine (XYLOCAINE) 2 % solution 2 TSP  PO QAC AND HS TO PREVENT FOR CHEST PAIN WHILE EATING 500 mL 1 Past Week at Unknown time  . LORazepam (ATIVAN) 1 MG tablet Take 1 tablet (1 mg total) by mouth every 12 (twelve) hours as needed for anxiety. 15 tablet 0 Past Week at Unknown time  . magnesium oxide (MAG-OX) 400 (241.3 MG) MG tablet Take 1 tablet (400 mg total) by mouth daily. 30 tablet 3 10/24/2014 at Unknown time  . metolazone (ZAROXOLYN) 5 MG tablet Take 2 tablets (10 mg total) by mouth daily. 60 tablet 3 10/24/2014 at Unknown time  . midodrine (PROAMATINE) 10 MG tablet Take 1 tablet (10 mg total) by mouth 2 (two) times daily with a meal. Medication to keep your blood pressure from falling. 60 tablet 3 10/24/2014 at Unknown time  . Nutritional Supplements (FEEDING SUPPLEMENT, NEPRO CARB STEADY,) LIQD Take 237 mLs by mouth as needed (missed meal during dialysis.).  0 Past  Week at Unknown time  . pantoprazole (PROTONIX) 40 MG tablet Take 1 tablet (40 mg total) by mouth 2 (two) times daily. 60 tablet 2 10/24/2014 at Unknown time  . loperamide (IMODIUM) 2 MG capsule Take 1 capsule (2 mg total) by mouth 2 (two) times daily as needed for diarrhea or loose stools. 30 capsule 0 unknown  . Oxycodone HCl 10 MG TABS Take 1 tablet (10 mg total) by mouth every 6 (six) hours as needed. (Patient not taking: Reported on 10/24/2014) 25 tablet 0 Not Taking  . promethazine (PHENERGAN) 12.5 MG tablet Take 1 tablet (12.5 mg total) by mouth every 6 (six) hours as needed for nausea or vomiting. 30 tablet 1 unknown   Scheduled: . sodium chloride   Intravenous Once  . sodium chloride   Intravenous STAT  . chlorhexidine  15 mL Mouth/Throat QID  . ciprofloxacin  400 mg Intravenous Once  . midazolam      . pantoprazole (PROTONIX) IV  40 mg Intravenous Q12H  . sodium chloride  3 mL Intravenous Q12H   Continuous: . sodium chloride    . sodium chloride    . sodium chloride 20 mL/hr at 10/26/14 0300  . octreotide  (SANDOSTATIN)    IV infusion 25 mcg/hr (10/26/14 0300)  . propofol 50 mcg/kg/min (10/26/14 0700)   HQI:ONGEXBMW, fentaNYL, ondansetron **OR** ondansetron (ZOFRAN) IV  Assesment: She has hematemesis. She does have varices which were banded and she has gastritis. 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 There is no documentation of any lung disease. Active Problems:   Esophageal varices   Anemia associated with acute blood loss   Cirrhosis of liver with ascites   Hematemesis    Plan: Attempt weaning and extubation this morning    LOS: 1 day   Mariya Mottley L 10/26/2014, 7:53 AM

## 2014-10-26 NOTE — Progress Notes (Signed)
[facility] PROGRESS NOTE  Wanda [name] ZOX:[id number] DOB: [birth year] DOA: 10/25/2014 PCP: [facility]  Assessment/Plan: 1. Hematemesis secondary to diffuse gastric mucosal hemorrhage 1. S/p egd in ED overnight, intubated for airway protection 2. Noted to have 2 columns of grade 2 esophageal varices w/o active bleed s/p band ligation with portal gastropathy 3. GI recs for BID PPI and to cont Sandostatin 4. Extubate as tolerated 2. Acute blood loss anemia 1. Secondary to above 3. NASH with cirrhosis 1. Continued on Sandostatin per above 2. Once extubated, would resume home PO meds 4. ESRD 1. Nephrology consulted 2. Hold off on HD today 5. Thrombocytopenia 1. Likely secondary to cirrhosis 2. Monitor for now 6. DVT prophylaxis 1. SCD's  Code Status: Full Family Communication: Pt in room (indicate person spoken with, relationship, and if by phone, the number) Disposition Plan: Pending   Consultants:  Nephrology  GI  Pulmonary  Procedures:  EGD 3/18  Antibiotics:  none  HPI/Subjective: No acute events noted  Objective: Filed Vitals:   10/26/14 1430 10/26/14 1500 10/26/14 1530 10/26/14 1612  BP: 110/67 99/61 106/67   Pulse: 88 79 79   Temp:    98.2 F (36.8 C)  TempSrc:    Oral  Resp: 15  17   Height:      Weight:      SpO2: 99% 97% 98%     Intake/Output Summary (Last 24 hours) at 10/26/14 1647 Last data filed at 10/26/14 0700  Gross per 24 hour  Intake 1031.53 ml  Output    300 ml  Net 731.53 ml   Filed Weights   10/25/14 1921 10/26/14 0300 10/26/14 0545  Weight: 68.04 kg (150 lb) 68.4 kg (150 lb 12.7 oz) 54.3 kg (119 lb 11.4 oz)    Exam:   General:  Awake, intubated, in nad, nodding appropriately to questions  Cardiovascular: regular, s1, s2  Respiratory: normal resp effort, no wheezing  Abdomen: soft, nondistended  Musculoskeletal: perfused, no clubbing   Data Reviewed: Basic Metabolic Panel:  Recent  Labs Lab 10/25/14 2005 10/26/14 0834  NA 138 139  K 3.5 3.4*  CL 102 104  CO2 27 24  GLUCOSE 92 92  BUN 34* 42*  CREATININE 1.35* 1.96*  CALCIUM 8.7 8.8   Liver Function Tests:  Recent Labs Lab 10/25/14 2005 10/26/14 0834  AST 71* 67*  ALT 24 21  ALKPHOS 76 75  BILITOT 2.1* 3.5*  PROT 6.4 6.2  ALBUMIN 3.0* 3.0*    Recent Labs Lab 10/25/14 2005  LIPASE 82*   No results for input(s): AMMONIA in the last 168 hours. CBC:  Recent Labs Lab 10/25/14 2005 10/26/14 0834  WBC 6.8 6.7  NEUTROABS 5.2  --   HGB 8.2* 10.1*  HCT 25.1* 30.5*  MCV 102.0* 98.4  PLT 81* 74*   Cardiac Enzymes: No results for input(s): CKTOTAL, CKMB, CKMBINDEX, TROPONINI in the last 168 hours. BNP (last 3 results) No results for input(s): BNP in the last 8760 hours.  ProBNP (last 3 results) No results for input(s): PROBNP in the last 8760 hours.  CBG: No results for input(s): GLUCAP in the last 168 hours.  Recent Results (from the past 240 hour(s))  MRSA PCR Screening     Status: None   Collection Time: 10/26/14  1:20 AM  Result Value Ref Range Status   MRSA by PCR NEGATIVE NEGATIVE Final    Comment:        The GeneXpert MRSA Assay (FDA  approved for NASAL specimens only), is one component of a comprehensive MRSA colonization surveillance program. It is not intended to diagnose MRSA infection nor to guide or monitor treatment for MRSA infections.      Studies: Dg Chest 1 View  10/26/2014   CLINICAL DATA:  Endotracheal tube placement.  Initial encounter.  EXAM: CHEST  1 VIEW  COMPARISON:  Chest radiograph performed 07/04/2014  FINDINGS: The patient's endotracheal tube is seen ending 2 cm above the carina. The right IJ dual-lumen catheter is seen ending within the right atrium.  The lungs are well-aerated. Vascular congestion is noted, with mildly increased interstitial markings, possibly reflecting mild interstitial edema. There is no evidence of pleural effusion or pneumothorax.   The cardiomediastinal silhouette is within normal limits. No acute osseous abnormalities are seen.  IMPRESSION: 1. Endotracheal tube seen ending 2 cm above the carina. 2. Vascular congestion, with mildly increased interstitial markings, possibly reflecting mild interstitial edema.   Electronically Signed   By: Garald Balding M.D.   On: 10/26/2014 01:43   US Paracentesis  10/26/2014   CLINICAL DATA:  Cirrhosis, ascites  EXAM: ULTRASOUND GUIDED THERAPEUTIC PARACENTESIS  COMPARISON:  10/03/2014  PROCEDURE: Procedure, benefits, and risks of procedure were discussed with patient.  Written informed consent for procedure was obtained.  Time out protocol followed.  Procedure was performed portably.  Adequate collection of ascites localized by ultrasound in RIGHT lower quadrant.  Skin prepped and draped in usual sterile fashion.  Skin and soft tissues anesthetized with 10 mL of 1% lidocaine.  5 [place] Yueh catheter placed into peritoneal cavity.  6400 mL of yellow colored ascites aspirated by vacuum bottle suction.  Procedure tolerated well by patient without immediate complication.  FINDINGS: As above  IMPRESSION: Successful ultrasound guided paracentesis yielding 6400 mL of ascites.   Electronically Signed   By: Lavonia Dana M.D.   On: 10/26/2014 15:18    Scheduled Meds: . sodium chloride   Intravenous Once  . sodium chloride   Intravenous STAT  . bumetanide (BUMEX) IV  1 mg Intravenous Q12H  . chlorhexidine  15 mL Mouth/Throat QID  . [START ON 10/27/2014] ciprofloxacin  500 mg Oral Q24H  . ondansetron (ZOFRAN) IV  4 mg Intravenous TID AC  . pantoprazole  40 mg Oral BID AC  . sodium chloride  3 mL Intravenous Q12H   Continuous Infusions: . sodium chloride    . sodium chloride    . sodium chloride 20 mL/hr at 10/26/14 0300  . octreotide  (SANDOSTATIN)    IV infusion 25 mcg (10/26/14 1400)  . propofol Stopped (10/26/14 0915)    Active Problems:   Esophageal varices   ESRD (end stage renal disease) on  dialysis   Anemia associated with acute blood loss   Cirrhosis of liver with ascites   Hematemesis   [facility], [facility] Pager [phone]. If 7PM-7AM, please contact night-coverage at [email or web address], password [facility] 10/26/2014, 4:47 PM  LOS: 1 day

## 2014-10-26 NOTE — Progress Notes (Signed)
Subjective: Is progressing today, has been extubated as of this morning. Admits lower abdominal pain, some residual nausea "but not as bad as yesterday." Complains of abdominal distension and discomfort. Denies further GI bleeding. Feels sleepy from the sedation.  Objective: Vital signs in last 24 hours: Temp:  [97.7 F (36.5 C)-98.7 F (37.1 C)] 98.7 F (37.1 C) (03/18 0759) Pulse Rate:  [83-134] 91 (03/18 0800) Resp:  [11-30] 11 (03/18 0800) BP: (96-154)/(42-104) 133/79 mmHg (03/18 0759) SpO2:  [93 %-100 %] 100 % (03/18 0800) FiO2 (%):  [40 %-50 %] 40 % (03/18 0800) Weight:  [119 lb 11.4 oz (54.3 kg)-150 lb 12.7 oz (68.4 kg)] 119 lb 11.4 oz (54.3 kg) (03/18 0545)   General:   Alert and oriented, pleasant. Sleepy/drowsy s/p sedation and extubation. Head:  Normocephalic and atraumatic. Eyes:  No icterus, sclera clear. Conjuctiva pink.  Heart:  S1, S2 present, no murmurs noted.  Lungs: Clear to auscultation bilaterally, without wheezing, rales, or rhonchi.  Abdomen:  Bowel sounds present. Distended with noted discomfort to palpation, umbilical protrusion noted which is firm. No HSM or hernias noted. No rebound or guarding. No masses appreciated  Pulses:  Normal DP pulses noted. Extremities:  Without clubbing or edema. Neurologic:  Alert and  oriented x4;  grossly normal neurologically. Skin:  Warm and dry, intact without significant lesions.  Psych:  Alert and cooperative. Normal mood and affect.  Intake/Output from previous day: 03/17 0701 - 03/18 0700 In: 1031.5 [I.V.:484; Blood:547.5] Out: 300 [Emesis/NG output:300] Intake/Output this shift:    Lab Results:  Recent Labs  10/25/14 2005 10/26/14 0834  WBC 6.8 6.7  HGB 8.2* 10.1*  HCT 25.1* 30.5*  PLT 81* 74*   BMET  Recent Labs  10/25/14 2005 10/26/14 0834  NA 138 139  K 3.5 3.4*  CL 102 104  CO2 27 24  GLUCOSE 92 92  BUN 34* 42*  CREATININE 1.35* 1.96*  CALCIUM 8.7 8.8   LFT  Recent Labs   10/25/14 2005 10/26/14 0834  PROT 6.4 6.2  ALBUMIN 3.0* 3.0*  AST 71* 67*  ALT 24 21  ALKPHOS 76 75  BILITOT 2.1* 3.5*   PT/INR  Recent Labs  10/25/14 2243  LABPROT 15.4*  INR 1.21   Hepatitis Panel No results for input(s): HEPBSAG, HCVAB, HEPAIGM, HEPBIGM in the last 72 hours.   Studies/Results: Dg Chest 1 View  10/26/2014   CLINICAL DATA:  Endotracheal tube placement.  Initial encounter.  EXAM: CHEST  1 VIEW  COMPARISON:  Chest radiograph performed 07/04/2014  FINDINGS: The patient's endotracheal tube is seen ending 2 cm above the carina. The right IJ dual-lumen catheter is seen ending within the right atrium.  The lungs are well-aerated. Vascular congestion is noted, with mildly increased interstitial markings, possibly reflecting mild interstitial edema. There is no evidence of pleural effusion or pneumothorax.  The cardiomediastinal silhouette is within normal limits. No acute osseous abnormalities are seen.  IMPRESSION: 1. Endotracheal tube seen ending 2 cm above the carina. 2. Vascular congestion, with mildly increased interstitial markings, possibly reflecting mild interstitial edema.   Electronically Signed   By: Garald Balding M.D.   On: 10/26/2014 01:43    Assessment: 39 year old female with NASH cirrhosis s/p UGI bleed and 2 gram Hgb drop with bedside EGD last night and varix banding x 2, noted very congested and friable gastric mucosa likely source of bleed. Has received 2 units PRBC last night. Tense ascites with umbilical hernia noted. Last  paracentesis approximately 2 weeks ago. Had a scheduled transplant appointment at [facility] today. Remains on sandostatin drip and protonix 40 mg IV push bid. H/H this morning is improved at 10.1/30.5, WBC 6.7. Patient extubated, awake and alert, conversational.     Plan: 1. SBP prophylaxis for cirrhosis patient with GI bleed. ESRD, will order pharmacy consult for ceftriaxone IV prophylaxis. 2. Continue IV push PPI 3. Continue  Sandostatin 4. Notify if any recurrent GI bleed. 5. Recheck CBC in the morning or sooner if concerns of overt recurrent bleeding 6. Will order paracentesis for today for tense ascites, Albumin 25g if > 4L removed.    Walden Field, AGNP-C Adult & Gerontological Nurse Practitioner [facility]     LOS: 1 day    10/26/2014, 10:17 AM

## 2014-10-26 NOTE — Procedures (Signed)
PreOperative Dx: Cirrhosis, ascites Postoperative Dx: Cirrhosis, ascites Procedure:   US guided paracentesis Radiologist:  Thornton Papas Anesthesia:  10 ml of 1% lidocaine Specimen:  6400 ml of yellow ascitic fluid EBL:   < 1 ml Complications: None

## 2014-10-26 NOTE — Consult Note (Addendum)
Reason for Consult: Acute kidney injury superimposed on chronic and on dialysis Referring Physician: Dr. Kathee Polite Wanda [name] is an 39 y.o. female.  HPI: She is a patient who has liver cirrhosis, anasarca, renal failure on dialysis presently came with complaints of vomiting of blood. When she was evaluated patient was told to have bleeding from varices intubated for air protection and admitted to the hospital. Presently patient is alert and intubated.  Past Medical History  Diagnosis Date  . GERD (gastroesophageal reflux disease)   . 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 c/w steatohepatitis  . Folate deficiency 09/2013  . Anasarca 10/10/2013  . Hematemesis/vomiting blood 02/24/2014  . Acute blood loss anemia 02/25/2014    Status post transfusion  . Macrocytosis 02/28/2014  . 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 Acute renal failure 09/2013    Pre-renal- resolved  . C. difficile colitis 04/19/2014  . Anxiety   . Depression   . Thrombocytopenia     Hypercellular bone marrow; abundant megakaryocytes per 08/27/2014; s/p bone marrow bx  . Cirrhosis of liver with ascites   . SBP (spontaneous bacterial peritonitis) 11/10/2013  . Bipolar disorder 12/04/2013    2007-SEEN IN ED FOR INVOLUNTARY COMMITMENT, UDS POS FOR AMPHETAMINES/OPIATES   . PNA (pneumonia) 10/13/2013  . Chronic hypotension   . Gastroesophageal junction ulcer 09/17/2014  . ESRD (end stage renal disease) on dialysis 08/2014    Past Surgical History  Procedure Laterality Date  . None    . Paracentesis  Feb 2015    1180 fluid, negative fluid analysis.   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 Paracentesis  10/2013  . Colonoscopy N/A 12/19/2013    SLF:NO OBVIOUS SOURCE FOR ANEMIA IDETIFIED/ONE COLON POLYP REMOVED/Small internal hemorrhoids   . Esophagogastroduodenoscopy N/A 02/11/2014    Dr. Rourk:Esophageal varices with bleeding stigmata-status post esophageal band ligation therapy. Portal gastropathy  . Esophagogastroduodenoscopy N/A 07/04/2014    RMR: Persiting grade 2 esophageal varicies with bleeding stigmata status post band ligation. Significantly congested gastric mucosa iwith changes constistant with protal gastropathy.   . Esophageal banding  07/04/2014    Procedure: ESOPHAGEAL BANDING;  Surgeon: Daneil Dolin, MD;  Location: [facility];  Service: Endoscopy;;  . Esophagogastroduodenoscopy (egd) with propofol N/A 07/24/2014    SLF:  1. 2 columns grade 2-3 varices- 2 Bands applied.  2.  Moderate gastropathy 3. Duodenal Diverticula  . Esophageal banding N/A 07/24/2014    Procedure: ESOPHAGEAL BANDING (2 bands applied);  Surgeon: Danie Binder, MD;  Location: [facility];  Service: Endoscopy;  Laterality: N/A;  . Central venous catheter insertion Right   . Esophagogastroduodenoscopy N/A 09/16/2014    Procedure: ESOPHAGOGASTRODUODENOSCOPY (EGD) AT BEDSIDE;  Surgeon: Rogene Houston, MD;  Location: [facility];  Service: Endoscopy;  Laterality: N/A;    Family History  Problem Relation Age of Onset  . Heart disease Mother   . Colon cancer Neg Hx   . Liver disease Neg Hx     Social History:  reports that she quit smoking about 5 years ago. Her smoking use included Cigarettes. She has a 5 pack-year smoking history. She has quit using smokeless tobacco. She reports that she does not drink alcohol or use illicit drugs.  Allergies:  Allergies  Allergen Reactions  . Lasix [Furosemide]     "  doesn't work"  . Morphine And Related Hives and Itching    Medications: I have reviewed the patient's current medications.  Results for orders placed or performed during the hospital encounter of 10/25/14 (from the past 48 hour(s))  Comprehensive metabolic panel     Status: Abnormal   Collection Time: 10/25/14  8:05 PM  Result Value  Ref Range   Sodium 138 135 - 145 mmol/L   Potassium 3.5 3.5 - 5.1 mmol/L   Chloride 102 96 - 112 mmol/L   CO2 27 19 - 32 mmol/L   Glucose, Bld 92 70 - 99 mg/dL   BUN 34 (H) 6 - 23 mg/dL   Creatinine, Ser 1.35 (H) 0.50 - 1.10 mg/dL   Calcium 8.7 8.4 - 10.5 mg/dL   Total Protein 6.4 6.0 - 8.3 g/dL   Albumin 3.0 (L) 3.5 - 5.2 g/dL   AST 71 (H) 0 - 37 U/L   ALT 24 0 - 35 U/L   Alkaline Phosphatase 76 39 - 117 U/L   Total Bilirubin 2.1 (H) 0.3 - 1.2 mg/dL   GFR calc non Af Amer 48 (L) >90 mL/min   GFR calc Af Amer 56 (L) >90 mL/min    Comment: (NOTE) The eGFR has been calculated using the CKD EPI equation. This calculation has not been validated in all clinical situations. eGFR's persistently <90 mL/min signify possible Chronic Kidney Disease.    Anion gap 9 5 - 15  CBC with Differential/Platelet     Status: Abnormal   Collection Time: 10/25/14  8:05 PM  Result Value Ref Range   WBC 6.8 4.0 - 10.5 K/uL   RBC 2.46 (L) 3.87 - 5.11 MIL/uL   Hemoglobin 8.2 (L) 12.0 - 15.0 g/dL   HCT 25.1 (L) 36.0 - 46.0 %   MCV 102.0 (H) 78.0 - 100.0 fL   MCH 33.3 26.0 - 34.0 pg   MCHC 32.7 30.0 - 36.0 g/dL   RDW 16.7 (H) 11.5 - 15.5 %   Platelets 81 (L) 150 - 400 K/uL    Comment: RESULT REPEATED AND VERIFIED SPECIMEN CHECKED FOR CLOTS    Neutrophils Relative % 77 43 - 77 %   Neutro Abs 5.2 1.7 - 7.7 K/uL   Lymphocytes Relative 16 12 - 46 %   Lymphs Abs 1.1 0.7 - 4.0 K/uL   Monocytes Relative 7 3 - 12 %   Monocytes Absolute 0.4 0.1 - 1.0 K/uL   Eosinophils Relative 0 0 - 5 %   Eosinophils Absolute 0.0 0.0 - 0.7 K/uL   Basophils Relative 0 0 - 1 %   Basophils Absolute 0.0 0.0 - 0.1 K/uL   Smear Review PLATELETS APPEAR DECREASED   Type and screen     Status: None (Preliminary result)   Collection Time: 10/25/14  8:05 PM  Result Value Ref Range   ABO/RH(D) A POS    Antibody Screen NEG    Sample Expiration 10/28/2014    Unit Number [id number]    Blood Component Type RED CELLS,LR     Unit division 00    Status of Unit ISSUED    Transfusion Status OK TO TRANSFUSE    Crossmatch Result Compatible    Unit Number [id number]    Blood Component Type RED CELLS,LR    Unit division 00    Status of Unit ISSUED    Transfusion Status OK TO TRANSFUSE    Crossmatch Result Compatible   Lipase, blood     Status: Abnormal   Collection  Time: 10/25/14  8:05 PM  Result Value Ref Range   Lipase 82 (H) 11 - 59 U/L  Prepare RBC     Status: None   Collection Time: 10/25/14  8:15 PM  Result Value Ref Range   Order Confirmation ORDER PROCESSED BY BLOOD BANK   Protime-INR     Status: Abnormal   Collection Time: 10/25/14 10:43 PM  Result Value Ref Range   Prothrombin Time 15.4 (H) 11.6 - 15.2 seconds   INR 1.21 0.00 - 1.49  MRSA PCR Screening     Status: None   Collection Time: 10/26/14  1:20 AM  Result Value Ref Range   MRSA by PCR NEGATIVE NEGATIVE    Comment:        The GeneXpert MRSA Assay (FDA approved for NASAL specimens only), is one component of a comprehensive MRSA colonization surveillance program. It is not intended to diagnose MRSA infection nor to guide or monitor treatment for MRSA infections.   Triglycerides     Status: Abnormal   Collection Time: 10/26/14  1:22 AM  Result Value Ref Range   Triglycerides 164 (H) <150 mg/dL  Blood gas, arterial     Status: Abnormal   Collection Time: 10/26/14  4:30 AM  Result Value Ref Range   FIO2 50.00 %   Delivery systems VENTILATOR    Mode PRESSURE REGULATED VOLUME CONTROL    VT 500 mL   Rate 15.0 resp/min   Peep/cpap 5.0 cm H20   pH, Arterial 7.542 (H) 7.350 - 7.450   pCO2 arterial 27.0 (L) 35.0 - 45.0 mmHg   pO2, Arterial 209.0 (H) 80.0 - 100.0 mmHg   Bicarbonate 23.1 20.0 - 24.0 mEq/L   TCO2 20.5 0 - 100 mmol/L   Acid-Base Excess 0.7 0.0 - 2.0 mmol/L   O2 Saturation 99.8 %   Patient temperature 37.0    Collection site LEFT RADIAL    Drawn by [id number]    Sample type ARTERIAL    Allens test (pass/fail) PASS  PASS    Dg Chest 1 View  10/26/2014   CLINICAL DATA:  Endotracheal tube placement.  Initial encounter.  EXAM: CHEST  1 VIEW  COMPARISON:  Chest radiograph performed 07/04/2014  FINDINGS: The patient's endotracheal tube is seen ending 2 cm above the carina. The right IJ dual-lumen catheter is seen ending within the right atrium.  The lungs are well-aerated. Vascular congestion is noted, with mildly increased interstitial markings, possibly reflecting mild interstitial edema. There is no evidence of pleural effusion or pneumothorax.  The cardiomediastinal silhouette is within normal limits. No acute osseous abnormalities are seen.  IMPRESSION: 1. Endotracheal tube seen ending 2 cm above the carina. 2. Vascular congestion, with mildly increased interstitial markings, possibly reflecting mild interstitial edema.   Electronically Signed   By: Garald Balding M.D.   On: 10/26/2014 01:43    Review of Systems  Unable to perform ROS: intubated   Blood pressure 133/79, pulse 91, temperature 98.7 F (37.1 C), temperature source Axillary, resp. rate 11, height 5' (1.524 m), weight 54.3 kg (119 lb 11.4 oz), SpO2 100 %. Physical Exam  Constitutional: No distress.  Eyes: Left eye exhibits no discharge.  Neck: No JVD present.  Cardiovascular: Normal rate and regular rhythm.   Respiratory: No respiratory distress. She has wheezes.  GI: She exhibits distension. There is no tenderness. There is no rebound.  Musculoskeletal: She exhibits edema.    Assessment/Plan: Problem #1 renal failure: Acute on chronic. Patient presently on dialysis. Presently  her BUN and creatinine is stable and normal potassium. Problem #2 GI bleeding Problem #3 liver cirrhosis Problem #4 anasarca Problem #5 history of bipolar disorder Problem #6 anemia: Possibly related to her GI bleeding. Her hemoglobin is below target goal. Plan: We'll hold dialysis today We'll give her Bumetanide 1 mg IV twice a day We'll check her basic  metabolic panel  in the morning.  Dierks Wach S 10/26/2014, 8:18 AM

## 2014-10-27 LAB — TYPE AND SCREEN
ABO/RH(D): A POS
Antibody Screen: NEGATIVE
Unit division: 0
Unit division: 0

## 2014-10-27 LAB — CBC
HCT: 31.3 % — ABNORMAL LOW (ref 36.0–46.0)
Hemoglobin: 10.3 g/dL — ABNORMAL LOW (ref 12.0–15.0)
MCH: 32.5 pg (ref 26.0–34.0)
MCHC: 32.9 g/dL (ref 30.0–36.0)
MCV: 98.7 fL (ref 78.0–100.0)
PLATELETS: 71 10*3/uL — AB (ref 150–400)
RBC: 3.17 MIL/uL — AB (ref 3.87–5.11)
RDW: 19.3 % — ABNORMAL HIGH (ref 11.5–15.5)
WBC: 6.7 10*3/uL (ref 4.0–10.5)

## 2014-10-27 LAB — BASIC METABOLIC PANEL
ANION GAP: 11 (ref 5–15)
BUN: 47 mg/dL — ABNORMAL HIGH (ref 6–23)
CHLORIDE: 103 mmol/L (ref 96–112)
CO2: 25 mmol/L (ref 19–32)
Calcium: 9 mg/dL (ref 8.4–10.5)
Creatinine, Ser: 2.68 mg/dL — ABNORMAL HIGH (ref 0.50–1.10)
GFR, EST AFRICAN AMERICAN: 24 mL/min — AB (ref >90)
GFR, EST NON AFRICAN AMERICAN: 21 mL/min — AB (ref >90)
GLUCOSE: 92 mg/dL (ref 70–99)
Potassium: 3.1 mmol/L — ABNORMAL LOW (ref 3.5–5.1)
SODIUM: 139 mmol/L (ref 135–145)

## 2014-10-27 LAB — MAGNESIUM: Magnesium: 1.5 mg/dL (ref 1.5–2.5)

## 2014-10-27 MED ORDER — OCTREOTIDE ACETATE 500 MCG/ML IJ SOLN
25.0000 ug/h | INTRAMUSCULAR | Status: AC
  Administered 2014-10-27 – 2014-10-28 (×2): 25 ug/h via INTRAVENOUS
  Filled 2014-10-27 (×2): qty 1

## 2014-10-27 MED ORDER — MIDODRINE HCL 5 MG PO TABS: 10.0000 mg | ORAL_TABLET | Freq: Two times a day (BID) | ORAL | Status: DC

## 2014-10-27 MED ORDER — ALBUMIN HUMAN 25 % IV SOLN
25.0000 g | Freq: Four times a day (QID) | INTRAVENOUS | Status: AC
  Administered 2014-10-27 – 2014-10-29 (×8): 25 g via INTRAVENOUS
  Filled 2014-10-27 (×8): qty 100

## 2014-10-27 MED ORDER — POTASSIUM CHLORIDE 20 MEQ PO PACK
40.0000 meq | PACK | Freq: Two times a day (BID) | ORAL | Status: AC
Start: 2014-10-27 — End: 2014-10-27
  Administered 2014-10-27 (×2): 40 meq via ORAL
  Filled 2014-10-27 (×2): qty 2

## 2014-10-27 MED ORDER — MIDODRINE HCL 5 MG PO TABS
10.0000 mg | ORAL_TABLET | Freq: Three times a day (TID) | ORAL | Status: DC
  Administered 2014-10-27 – 2014-11-01 (×15): 10 mg via ORAL
  Filled 2014-10-27 (×15): qty 2

## 2014-10-27 NOTE — Progress Notes (Signed)
[facility] PROGRESS NOTE  Wanda [name] YKD:[id number] DOB: [birth year] DOA: 10/25/2014 PCP: [facility]  Assessment/Plan: 1. Hematemesis secondary to diffuse gastric mucosal hemorrhage from portal gastropathy 1. S/p egd in ED 3/17, intubated for airway protection, now extubated 2. Noted to have 2 columns of grade 2 esophageal varices w/o active bleed s/p band ligation with portal gastropathy 3. Continuing on BID PPI 4. GI recs to cont cipro qday x 7 days and to stop octreotide on 3/20 at 2200 2. Acute blood loss anemia 1. Secondary to above 2. Hgb stable 3. NASH with cirrhosis 1. Continued on Sandostatin per above, to stop on 3/20 at 2200 4. ESRD 1. Nephrology following 2. Plans for HD 3/20 5. Thrombocytopenia 1. Likely secondary to cirrhosis 2. Monitor for now 3. Stable 6. DVT prophylaxis 1. SCD's  Code Status: Full Family Communication: Pt in room Disposition Plan: Pending   Consultants:  Nephrology  GI  Pulmonary  Procedures:  EGD 3/18  Antibiotics:  none  HPI/Subjective: Pt is without complaints  Objective: Filed Vitals:   10/27/14 1130 10/27/14 1158 10/27/14 1200 10/27/14 1230  BP: 87/50  90/53 96/50  Pulse: 63  66 67  Temp:  97.7 F (36.5 C)    TempSrc:  Oral    Resp: 12  10 13   Height:      Weight:      SpO2: 95%  95% 98%    Intake/Output Summary (Last 24 hours) at 10/27/14 1402 Last data filed at 10/27/14 1200  Gross per 24 hour  Intake    240 ml  Output   1225 ml  Net   -985 ml   Filed Weights   10/26/14 0300 10/26/14 0545 10/27/14 0500  Weight: 68.4 kg (150 lb 12.7 oz) 54.3 kg (119 lb 11.4 oz) 49.8 kg (109 lb 12.6 oz)    Exam:   General:  Awake, intubated, in nad, nodding appropriately to questions  Cardiovascular: regular, s1, s2  Respiratory: normal resp effort, no wheezing  Abdomen: soft, nondistended  Musculoskeletal: perfused, no clubbing   Data Reviewed: Basic Metabolic Panel:  Recent  Labs Lab 10/25/14 2005 10/26/14 0834 10/27/14 0526 10/27/14 0830  NA 138 139 139  --   K 3.5 3.4* 3.1*  --   CL 102 104 103  --   CO2 27 24 25   --   GLUCOSE 92 92 92  --   BUN 34* 42* 47*  --   CREATININE 1.35* 1.96* 2.68*  --   CALCIUM 8.7 8.8 9.0  --   MG  --   --   --  1.5   Liver Function Tests:  Recent Labs Lab 10/25/14 2005 10/26/14 0834  AST 71* 67*  ALT 24 21  ALKPHOS 76 75  BILITOT 2.1* 3.5*  PROT 6.4 6.2  ALBUMIN 3.0* 3.0*    Recent Labs Lab 10/25/14 2005  LIPASE 82*   No results for input(s): AMMONIA in the last 168 hours. CBC:  Recent Labs Lab 10/25/14 2005 10/26/14 0834 10/27/14 0526  WBC 6.8 6.7 6.7  NEUTROABS 5.2  --   --   HGB 8.2* 10.1* 10.3*  HCT 25.1* 30.5* 31.3*  MCV 102.0* 98.4 98.7  PLT 81* 74* 71*   Cardiac Enzymes: No results for input(s): CKTOTAL, CKMB, CKMBINDEX, TROPONINI in the last 168 hours. BNP (last 3 results) No results for input(s): BNP in the last 8760 hours.  ProBNP (last 3 results) No results for input(s): PROBNP in the last 8760  hours.  CBG: No results for input(s): GLUCAP in the last 168 hours.  Recent Results (from the past 240 hour(s))  MRSA PCR Screening     Status: None   Collection Time: 10/26/14  1:20 AM  Result Value Ref Range Status   MRSA by PCR NEGATIVE NEGATIVE Final    Comment:        The GeneXpert MRSA Assay (FDA approved for NASAL specimens only), is one component of a comprehensive MRSA colonization surveillance program. It is not intended to diagnose MRSA infection nor to guide or monitor treatment for MRSA infections.      Studies: Dg Chest 1 View  10/26/2014   CLINICAL DATA:  Endotracheal tube placement.  Initial encounter.  EXAM: CHEST  1 VIEW  COMPARISON:  Chest radiograph performed 07/04/2014  FINDINGS: The patient's endotracheal tube is seen ending 2 cm above the carina. The right IJ dual-lumen catheter is seen ending within the right atrium.  The lungs are well-aerated.  Vascular congestion is noted, with mildly increased interstitial markings, possibly reflecting mild interstitial edema. There is no evidence of pleural effusion or pneumothorax.  The cardiomediastinal silhouette is within normal limits. No acute osseous abnormalities are seen.  IMPRESSION: 1. Endotracheal tube seen ending 2 cm above the carina. 2. Vascular congestion, with mildly increased interstitial markings, possibly reflecting mild interstitial edema.   Electronically Signed   By: Garald Balding M.D.   On: 10/26/2014 01:43   US Paracentesis  10/26/2014   CLINICAL DATA:  Cirrhosis, ascites  EXAM: ULTRASOUND GUIDED THERAPEUTIC PARACENTESIS  COMPARISON:  10/03/2014  PROCEDURE: Procedure, benefits, and risks of procedure were discussed with patient.  Written informed consent for procedure was obtained.  Time out protocol followed.  Procedure was performed portably.  Adequate collection of ascites localized by ultrasound in RIGHT lower quadrant.  Skin prepped and draped in usual sterile fashion.  Skin and soft tissues anesthetized with 10 mL of 1% lidocaine.  5 [place] Yueh catheter placed into peritoneal cavity.  6400 mL of yellow colored ascites aspirated by vacuum bottle suction.  Procedure tolerated well by patient without immediate complication.  FINDINGS: As above  IMPRESSION: Successful ultrasound guided paracentesis yielding 6400 mL of ascites.   Electronically Signed   By: Lavonia Dana M.D.   On: 10/26/2014 15:18    Scheduled Meds: . sodium chloride   Intravenous Once  . albumin human  25 g Intravenous Q6H  . bumetanide (BUMEX) IV  1 mg Intravenous Q12H  . chlorhexidine  15 mL Mouth/Throat QID  . ciprofloxacin  500 mg Oral Q24H  . folic acid  1 mg Oral Daily  . midodrine  10 mg Oral TID WC  . ondansetron (ZOFRAN) IV  4 mg Intravenous TID AC  . pantoprazole  40 mg Oral BID AC  . potassium chloride  40 mEq Oral BID  . sodium chloride  3 mL Intravenous Q12H   Continuous Infusions: . sodium  chloride    . sodium chloride    . sodium chloride 20 mL/hr at 10/26/14 0300  . octreotide  (SANDOSTATIN)    IV infusion      Active Problems:   Esophageal varices   ESRD (end stage renal disease) on dialysis   Anemia associated with acute blood loss   Cirrhosis of liver with ascites   Hematemesis   Endotracheal tube present   Hematemesis with nausea   CHIU, [facility] Pager [phone]. If 7PM-7AM, please contact night-coverage at [email or web address], password [facility] 10/27/2014,  2:02 PM  LOS: 2 days

## 2014-10-27 NOTE — Progress Notes (Addendum)
Patient ID: Wanda [name], female   DOB: Aug 03, 1976, 39 y.o.   MRN: [id number]   Assessment/Plan: ADMITTED WITH GI BLEED MOST LIKEY DUE TO PORTAL GASTROPATHY. HAS ENDOSCOPIC VARICEAL LIGATION ON OCTREOTIDE GTT FOR 72 HRS. TOLERATING POS. NO BRBPR OR MELENA. S/P LVP AND NOW Cr UP TO 2.68.  PLAN: 1. ALBUMIN 25 G Q6H x 8 DOSES 2. GENTLE HYDRATION. 3. CONTINUE CIPRO QD FOR 7 DAYS. DISCUSSED WITH PHARMACY. 4. STOP OCTREOTIDE MAR 20 AT 2200 5. ADVANCE DIET.    Subjective: Since I last evaluated the patient SHE HAD FLUID PULLED OFF HER BELLY. C/O UPPER AND UMBILICAL MILD ABD PAIN No OTHER questions or concerns.   Objective: Vital signs in last 24 hours: Filed Vitals:   10/27/14 1158  BP:   Pulse:   Temp: 97.7 F (36.5 C)  Resp:     General appearance: alert, cooperative and no distress Resp: clear to auscultation bilaterally Cardio: regular rate and rhythm GI: soft, non-tender; bowel sounds normal; no masses,  no organomegaly Extremities: extremities normal, atraumatic, no cyanosis or edema  Lab Results:  Cr 2.68 (GFR 24)  Hb 10.3 PLT 71   Studies/Results: Dg Chest 1 View  10/26/2014   CLINICAL DATA:  Endotracheal tube placement.  Initial encounter.  EXAM: CHEST  1 VIEW  COMPARISON:  Chest radiograph performed 07/04/2014  FINDINGS: The patient's endotracheal tube is seen ending 2 cm above the carina. The right IJ dual-lumen catheter is seen ending within the right atrium.  The lungs are well-aerated. Vascular congestion is noted, with mildly increased interstitial markings, possibly reflecting mild interstitial edema. There is no evidence of pleural effusion or pneumothorax.  The cardiomediastinal silhouette is within normal limits. No acute osseous abnormalities are seen.  IMPRESSION: 1. Endotracheal tube seen ending 2 cm above the carina. 2. Vascular congestion, with mildly increased interstitial markings, possibly reflecting mild interstitial edema.   Electronically Signed    By: Garald Balding M.D.   On: 10/26/2014 01:43   US Paracentesis  10/26/2014   CLINICAL DATA:  Cirrhosis, ascites  EXAM: ULTRASOUND GUIDED THERAPEUTIC PARACENTESIS  COMPARISON:  10/03/2014  PROCEDURE: Procedure, benefits, and risks of procedure were discussed with patient.  Written informed consent for procedure was obtained.  Time out protocol followed.  Procedure was performed portably.  Adequate collection of ascites localized by ultrasound in RIGHT lower quadrant.  Skin prepped and draped in usual sterile fashion.  Skin and soft tissues anesthetized with 10 mL of 1% lidocaine.  5 [place] Yueh catheter placed into peritoneal cavity.  6400 mL of yellow colored ascites aspirated by vacuum bottle suction.  Procedure tolerated well by patient without immediate complication.  FINDINGS: As above  IMPRESSION: Successful ultrasound guided paracentesis yielding 6400 mL of ascites.   Electronically Signed   By: Lavonia Dana M.D.   On: 10/26/2014 15:18    Medications: I have reviewed the patient's current medications.   LOS: 5 days   Barney Drain 01/18/2014, 2:23 PM

## 2014-10-27 NOTE — Progress Notes (Signed)
Subjective: Interval History: has no complaint of hematemesis. She has some nausea but no vomiting. Presently she denies any difficulty in breathing..  Objective: Vital signs in last 24 hours: Temp:  [97.8 F (36.6 C)-98.6 F (37 C)] 97.8 F (36.6 C) (03/19 0500) Pulse Rate:  [64-95] 78 (03/19 0730) Resp:  [9-30] 19 (03/19 0730) BP: (76-130)/(41-87) 76/52 mmHg (03/19 0730) SpO2:  [95 %-100 %] 97 % (03/19 0730) FiO2 (%):  [40 %] 40 % (03/18 0800) Weight:  [49.8 kg (109 lb 12.6 oz)] 49.8 kg (109 lb 12.6 oz) (03/19 0500) Weight change: -18.239 kg (-40 lb 3.4 oz)  Intake/Output from previous day: 03/18 0701 - 03/19 0700 In: -  Out: 1300 [Urine:1300] Intake/Output this shift:    General appearance: alert, cooperative and no distress Resp: clear to auscultation bilaterally Cardio: regular rate and rhythm, S1, S2 normal, no murmur, click, rub or gallop GI: soft, non-tender; bowel sounds normal; no masses,  no organomegaly Extremities: edema Trace edema  Lab Results:  Recent Labs  10/26/14 0834 10/27/14 0526  WBC 6.7 6.7  HGB 10.1* 10.3*  HCT 30.5* 31.3*  PLT 74* 71*   BMET:  Recent Labs  10/26/14 0834 10/27/14 0526  NA 139 139  K 3.4* 3.1*  CL 104 103  CO2 24 25  GLUCOSE 92 92  BUN 42* 47*  CREATININE 1.96* 2.68*  CALCIUM 8.8 9.0   No results for input(s): PTH in the last 72 hours. Iron Studies: No results for input(s): IRON, TIBC, TRANSFERRIN, FERRITIN in the last 72 hours.  Studies/Results: Dg Chest 1 View  10/26/2014   CLINICAL DATA:  Endotracheal tube placement.  Initial encounter.  EXAM: CHEST  1 VIEW  COMPARISON:  Chest radiograph performed 07/04/2014  FINDINGS: The patient's endotracheal tube is seen ending 2 cm above the carina. The right IJ dual-lumen catheter is seen ending within the right atrium.  The lungs are well-aerated. Vascular congestion is noted, with mildly increased interstitial markings, possibly reflecting mild interstitial edema. There is  no evidence of pleural effusion or pneumothorax.  The cardiomediastinal silhouette is within normal limits. No acute osseous abnormalities are seen.  IMPRESSION: 1. Endotracheal tube seen ending 2 cm above the carina. 2. Vascular congestion, with mildly increased interstitial markings, possibly reflecting mild interstitial edema.   Electronically Signed   By: Garald Balding M.D.   On: 10/26/2014 01:43   US Paracentesis  10/26/2014   CLINICAL DATA:  Cirrhosis, ascites  EXAM: ULTRASOUND GUIDED THERAPEUTIC PARACENTESIS  COMPARISON:  10/03/2014  PROCEDURE: Procedure, benefits, and risks of procedure were discussed with patient.  Written informed consent for procedure was obtained.  Time out protocol followed.  Procedure was performed portably.  Adequate collection of ascites localized by ultrasound in RIGHT lower quadrant.  Skin prepped and draped in usual sterile fashion.  Skin and soft tissues anesthetized with 10 mL of 1% lidocaine.  5 [place] Yueh catheter placed into peritoneal cavity.  6400 mL of yellow colored ascites aspirated by vacuum bottle suction.  Procedure tolerated well by patient without immediate complication.  FINDINGS: As above  IMPRESSION: Successful ultrasound guided paracentesis yielding 6400 mL of ascites.   Electronically Signed   By: Lavonia Dana M.D.   On: 10/26/2014 15:18    I have reviewed the patient's current medications.  Assessment/Plan: Problem #1 acute kidney injury superimposed on chronic. Presently patient on dialysis. Her dialysis was on Thursday. Her BUN and creatinine is increasing but she is symptomatic.  Problem #2 Esophageal varices bleeding:  Presently seems to have stopped. Her hemoglobin is stable. Problem #3 hypotension Problem #4 liver cirrhosis with ascites. Patient at this moment with minimal edema. She is nonoliguric Problem #5 metabolic bone disease: Her calcium is range Problem #6 hypokalemia Plan: We'll hold dialysis today and possibly make arrangements  for tomorrow We'll start her on Midodrin 10 mg by mouth 3 times a day  Check her basic metabolic panel and CBC in the morning   LOS: 2 days   Davante Gerke S 10/27/2014,7:59 AM

## 2014-10-28 DIAGNOSIS — R188 Other ascites: Secondary | ICD-10-CM

## 2014-10-28 LAB — BASIC METABOLIC PANEL
ANION GAP: 11 (ref 5–15)
BUN: 54 mg/dL — ABNORMAL HIGH (ref 6–23)
CO2: 23 mmol/L (ref 19–32)
Calcium: 9.1 mg/dL (ref 8.4–10.5)
Chloride: 104 mmol/L (ref 96–112)
Creatinine, Ser: 3.54 mg/dL — ABNORMAL HIGH (ref 0.50–1.10)
GFR calc Af Amer: 17 mL/min — ABNORMAL LOW (ref >90)
GFR calc non Af Amer: 15 mL/min — ABNORMAL LOW (ref >90)
Glucose, Bld: 104 mg/dL — ABNORMAL HIGH (ref 70–99)
Potassium: 3.6 mmol/L (ref 3.5–5.1)
Sodium: 138 mmol/L (ref 135–145)

## 2014-10-28 LAB — CBC
HEMATOCRIT: 30.2 % — AB (ref 36.0–46.0)
Hemoglobin: 10.2 g/dL — ABNORMAL LOW (ref 12.0–15.0)
MCH: 33.8 pg (ref 26.0–34.0)
MCHC: 33.8 g/dL (ref 30.0–36.0)
MCV: 100 fL (ref 78.0–100.0)
PLATELETS: 80 10*3/uL — AB (ref 150–400)
RBC: 3.02 MIL/uL — ABNORMAL LOW (ref 3.87–5.11)
RDW: 19 % — ABNORMAL HIGH (ref 11.5–15.5)
WBC: 6.3 10*3/uL (ref 4.0–10.5)

## 2014-10-28 LAB — TSH: TSH: 3.042 u[IU]/mL (ref 0.350–4.500)

## 2014-10-28 NOTE — Progress Notes (Signed)
Patient ID: Wanda [name], female   DOB: May 04, 1976, 39 y.o.   MRN: [id number]   Assessment/Plan: ADMITTED WITH GI BLED. CLINICALLY IMPROVED. HOSPITAL CR GOING UP AS WELL.  COURSE COMPLICATED BY LOW BP/HR/MILD ABD PAIN DOUBT SBP DUE TO PT BEING ON CIPRO SINCE ADMISSION.  PLAN: 1. CHECK TSH AND CORTISOL 2. CONTINUE TO MONITOR SYMPTOMS. 3. OCTEROTIDE D/C TODAY 4. CIPRO FOR 7 DAYS. 5. Continue albumin. D/C BUMEX. 6. WOULD CONSIDER REPEAT DIAGNOSTIC PARACENTESIS IF BP NOT IMPROVED IN AM.  Subjective: Since I last evaluated the patient SHE HAS HAD NO BRBPR OR MELENA. C/O MILD ABDOMINAL PAIN  Objective: Vital signs in last 24 hours: Filed Vitals:   10/28/14 0900  BP: 76/37  Pulse:   Temp:   Resp: 8    General appearance: no distress, resting Resp: clear to auscultation bilaterally Cardio: regular rate and rhythm GI: soft, MILD TP x 4; bowel sounds normal; umbilical hernia-reducible, mild distention  Lab Results:  Cr 3.54, BUN 54 Hb 10.2  Studies/Results: No results found.  Medications: I have reviewed the patient's current medications. CIPRO 500 MG DAILY.  LOS: 5 days   Barney Drain 01/18/2014, 2:23 PM

## 2014-10-28 NOTE — Progress Notes (Signed)
[facility] PROGRESS NOTE  Wanda [name] QMG:[id number] DOB: 1976-04-25 DOA: 10/25/2014 PCP: [facility]  Assessment/Plan: 1. Hematemesis secondary to diffuse gastric mucosal hemorrhage from portal gastropathy 1. S/p egd in ED 3/17 and was initially intubated for airway protection, now extubated 2. Noted to have 2 columns of grade 2 esophageal varices w/o active bleed s/p band ligation with portal gastropathy 3. Continuing on BID PPI 4. GI recs to cont cipro qday x 7 days and to stop octreotide on 3/20 at 2200 2. Acute blood loss anemia 1. Secondary to above 2. Hgb remains stable 3. NASH with cirrhosis 1. Currently remains on Sandostatin per above, to stop on 3/20 at 2200 per GI recs 2. S/p US guided paracentesis on 3/18 4. ESRD 1. Nephrology following 2. Recs for HD today 5. Thrombocytopenia 1. Likely secondary to cirrhosis 2. Monitor for now 3. Remains stable 6. DVT prophylaxis 1. SCD's  Code Status: Full Family Communication: Pt in room Disposition Plan: Pending   Consultants:  Nephrology  GI  Pulmonary  Procedures:  EGD 3/18  US guided paracentesis 3/18  Antibiotics:  none  HPI/Subjective: No acute events noted overnight. Pt still reports mild abd discomfort. Had trouble sleeping last night  Objective: Filed Vitals:   10/28/14 0400 10/28/14 0500 10/28/14 0600 10/28/14 0700  BP: 90/52 77/40 82/37  77/34  Pulse:  49  58  Temp: 97.9 F (36.6 C)     TempSrc: Oral     Resp:  12 11 15   Height:      Weight:  49.8 kg (109 lb 12.6 oz)    SpO2:  99%  99%    Intake/Output Summary (Last 24 hours) at 10/28/14 0821 Last data filed at 10/28/14 0600  Gross per 24 hour  Intake   1360 ml  Output   1325 ml  Net     35 ml   Filed Weights   10/26/14 0545 10/27/14 0500 10/28/14 0500  Weight: 54.3 kg (119 lb 11.4 oz) 49.8 kg (109 lb 12.6 oz) 49.8 kg (109 lb 12.6 oz)    Exam:   General:  Awake, intubated, in nad, nodding  appropriately to questions  Cardiovascular: regular, s1, s2  Respiratory: normal resp effort, no wheezing  Abdomen: soft, nondistended  Musculoskeletal: perfused, no clubbing   Data Reviewed: Basic Metabolic Panel:  Recent Labs Lab 10/25/14 2005 10/26/14 0834 10/27/14 0526 10/27/14 0830 10/28/14 0429  NA 138 139 139  --  138  K 3.5 3.4* 3.1*  --  3.6  CL 102 104 103  --  104  CO2 27 24 25   --  23  GLUCOSE 92 92 92  --  104*  BUN 34* 42* 47*  --  54*  CREATININE 1.35* 1.96* 2.68*  --  3.54*  CALCIUM 8.7 8.8 9.0  --  9.1  MG  --   --   --  1.5  --    Liver Function Tests:  Recent Labs Lab 10/25/14 2005 10/26/14 0834  AST 71* 67*  ALT 24 21  ALKPHOS 76 75  BILITOT 2.1* 3.5*  PROT 6.4 6.2  ALBUMIN 3.0* 3.0*    Recent Labs Lab 10/25/14 2005  LIPASE 82*   No results for input(s): AMMONIA in the last 168 hours. CBC:  Recent Labs Lab 10/25/14 2005 10/26/14 0834 10/27/14 0526 10/28/14 0429  WBC 6.8 6.7 6.7 6.3  NEUTROABS 5.2  --   --   --   HGB 8.2* 10.1* 10.3* 10.2*  HCT 25.1* 30.5* 31.3* 30.2*  MCV 102.0* 98.4 98.7 100.0  PLT 81* 74* 71* 80*   Cardiac Enzymes: No results for input(s): CKTOTAL, CKMB, CKMBINDEX, TROPONINI in the last 168 hours. BNP (last 3 results) No results for input(s): BNP in the last 8760 hours.  ProBNP (last 3 results) No results for input(s): PROBNP in the last 8760 hours.  CBG: No results for input(s): GLUCAP in the last 168 hours.  Recent Results (from the past 240 hour(s))  MRSA PCR Screening     Status: None   Collection Time: 10/26/14  1:20 AM  Result Value Ref Range Status   MRSA by PCR NEGATIVE NEGATIVE Final    Comment:        The GeneXpert MRSA Assay (FDA approved for NASAL specimens only), is one component of a comprehensive MRSA colonization surveillance program. It is not intended to diagnose MRSA infection nor to guide or monitor treatment for MRSA infections.      Studies: US  Paracentesis  10/26/2014   CLINICAL DATA:  Cirrhosis, ascites  EXAM: ULTRASOUND GUIDED THERAPEUTIC PARACENTESIS  COMPARISON:  10/03/2014  PROCEDURE: Procedure, benefits, and risks of procedure were discussed with patient.  Written informed consent for procedure was obtained.  Time out protocol followed.  Procedure was performed portably.  Adequate collection of ascites localized by ultrasound in RIGHT lower quadrant.  Skin prepped and draped in usual sterile fashion.  Skin and soft tissues anesthetized with 10 mL of 1% lidocaine.  5 [place] Yueh catheter placed into peritoneal cavity.  6400 mL of yellow colored ascites aspirated by vacuum bottle suction.  Procedure tolerated well by patient without immediate complication.  FINDINGS: As above  IMPRESSION: Successful ultrasound guided paracentesis yielding 6400 mL of ascites.   Electronically Signed   By: Lavonia Dana M.D.   On: 10/26/2014 15:18    Scheduled Meds: . sodium chloride   Intravenous Once  . albumin human  25 g Intravenous Q6H  . bumetanide (BUMEX) IV  1 mg Intravenous Q12H  . ciprofloxacin  500 mg Oral Q24H  . folic acid  1 mg Oral Daily  . midodrine  10 mg Oral TID WC  . ondansetron (ZOFRAN) IV  4 mg Intravenous TID AC  . pantoprazole  40 mg Oral BID AC  . sodium chloride  3 mL Intravenous Q12H   Continuous Infusions: . sodium chloride    . sodium chloride    . sodium chloride 20 mL/hr at 10/27/14 1900  . octreotide  (SANDOSTATIN)    IV infusion 25 mcg/hr (10/27/14 1404)    Active Problems:   Esophageal varices   ESRD (end stage renal disease) on dialysis   Anemia associated with acute blood loss   Cirrhosis of liver with ascites   Hematemesis   Endotracheal tube present   Hematemesis with nausea   Shavelle Runkel, [facility] Pager [phone]. If 7PM-7AM, please contact night-coverage at [email or web address], password [facility] 10/28/2014, 8:21 AM  LOS: 3 days

## 2014-10-28 NOTE — Progress Notes (Signed)
Subjective: Interval History: Patient feeling somewhat better. She has some nausea but no vomiting. She doesn't have also been hematemesis. She feels weak.  Objective: Vital signs in last 24 hours: Temp:  [97.7 F (36.5 C)-97.9 F (36.6 C)] 97.9 F (36.6 C) (03/20 0400) Pulse Rate:  [41-79] 65 (03/20 0800) Resp:  [10-17] 12 (03/20 0800) BP: (77-102)/(34-80) 85/58 mmHg (03/20 0800) SpO2:  [93 %-100 %] 99 % (03/20 0800) Weight:  [49.8 kg (109 lb 12.6 oz)] 49.8 kg (109 lb 12.6 oz) (03/20 0500) Weight change: 0 kg (0 lb)  Intake/Output from previous day: 03/19 0701 - 03/20 0700 In: 1480 [P.O.:480; I.V.:800; IV Piggyback:200] Out: 1325 [Urine:1325] Intake/Output this shift:    General appearance: alert, cooperative and no distress Resp: clear to auscultation bilaterally Cardio: regular rate and rhythm, S1, S2 normal, no murmur, click, rub or gallop GI: soft, non-tender; bowel sounds normal; no masses,  no organomegaly Extremities: edema Trace edema  Lab Results:  Recent Labs  10/27/14 0526 10/28/14 0429  WBC 6.7 6.3  HGB 10.3* 10.2*  HCT 31.3* 30.2*  PLT 71* 80*   BMET:   Recent Labs  10/27/14 0526 10/28/14 0429  NA 139 138  K 3.1* 3.6  CL 103 104  CO2 25 23  GLUCOSE 92 104*  BUN 47* 54*  CREATININE 2.68* 3.54*  CALCIUM 9.0 9.1   No results for input([name]): PTH in the last 72 hours. Iron Studies: No results for input([name]): IRON, TIBC, TRANSFERRIN, FERRITIN in the last 72 hours.  Studies/Results: US Paracentesis  10/26/2014   CLINICAL DATA:  Cirrhosis, ascites  EXAM: ULTRASOUND GUIDED THERAPEUTIC PARACENTESIS  COMPARISON:  10/03/2014  PROCEDURE: Procedure, benefits, and risks of procedure were discussed with patient.  Written informed consent for procedure was obtained.  Time out protocol followed.  Procedure was performed portably.  Adequate collection of ascites localized by ultrasound in RIGHT lower quadrant.  Skin prepped and draped in usual sterile fashion.  Skin  and soft tissues anesthetized with 10 mL of 1% lidocaine.  5 [place] Yueh catheter placed into peritoneal cavity.  6400 mL of yellow colored ascites aspirated by vacuum bottle suction.  Procedure tolerated well by patient without immediate complication.  FINDINGS: As above  IMPRESSION: Successful ultrasound guided paracentesis yielding 6400 mL of ascites.   Electronically Signed   By: Lavonia Dana M.D.   On: 10/26/2014 15:18    I have reviewed the patient'[name] current medications.  Assessment/Plan: Problem #1 acute kidney injury superimposed on chronic. Recently started on dialysis. Presently her creatinine still progressively increasing from 1.96 to 2.68 and presently 3.54. Patient presently is nonoliguric. Problem #2 Esophageal varices bleeding: Presently seems to have stopped. Her hemoglobin is stable. Problem #3 hypotension: On Midodrin no significant improvement. Problem #4 liver cirrhosis with ascites. Patient at this moment with minimal edema. She is nonoliguric Problem #5 metabolic bone disease: Her calcium is range Problem #6 hypokalemia: Her potassium has corrected. Plan: We'll make arrangements for patient to get dialysis tomorrow Check her basic metabolic panel and CBC in the morning   LOS: 3 days   Wanda [name] 10/28/2014,8:58 AM

## 2014-10-29 ENCOUNTER — Encounter (HOSPITAL_COMMUNITY): Payer: Self-pay | Admitting: Internal Medicine

## 2014-10-29 LAB — BASIC METABOLIC PANEL
ANION GAP: 13 (ref 5–15)
BUN: 56 mg/dL — ABNORMAL HIGH (ref 6–23)
CHLORIDE: 103 mmol/L (ref 96–112)
CO2: 22 mmol/L (ref 19–32)
Calcium: 8.9 mg/dL (ref 8.4–10.5)
Creatinine, Ser: 4.06 mg/dL — ABNORMAL HIGH (ref 0.50–1.10)
GFR calc non Af Amer: 13 mL/min — ABNORMAL LOW (ref >90)
GFR, EST AFRICAN AMERICAN: 15 mL/min — AB (ref >90)
Glucose, Bld: 96 mg/dL (ref 70–99)
POTASSIUM: 3.3 mmol/L — AB (ref 3.5–5.1)
SODIUM: 138 mmol/L (ref 135–145)

## 2014-10-29 LAB — CBC
HEMATOCRIT: 30.6 % — AB (ref 36.0–46.0)
HEMOGLOBIN: 9.9 g/dL — AB (ref 12.0–15.0)
MCH: 32.8 pg (ref 26.0–34.0)
MCHC: 32.4 g/dL (ref 30.0–36.0)
MCV: 101.3 fL — ABNORMAL HIGH (ref 78.0–100.0)
PLATELETS: 79 10*3/uL — AB (ref 150–400)
RBC: 3.02 MIL/uL — AB (ref 3.87–5.11)
RDW: 18.7 % — ABNORMAL HIGH (ref 11.5–15.5)
WBC: 5.6 10*3/uL (ref 4.0–10.5)

## 2014-10-29 LAB — TRIGLYCERIDES: Triglycerides: 139 mg/dL (ref <150)

## 2014-10-29 MED ORDER — SODIUM CHLORIDE 0.9 % IV SOLN: 100.0000 mL | INTRAVENOUS | Status: DC | PRN

## 2014-10-29 MED ORDER — HEPARIN SODIUM (PORCINE) 1000 UNIT/ML DIALYSIS
1000.0000 [IU] | INTRAMUSCULAR | Status: DC | PRN
  Filled 2014-10-29: qty 1

## 2014-10-29 MED ORDER — POTASSIUM CHLORIDE CRYS ER 20 MEQ PO TBCR
40.0000 meq | EXTENDED_RELEASE_TABLET | Freq: Once | ORAL | Status: AC
  Administered 2014-10-29: 40 meq via ORAL
  Filled 2014-10-29: qty 2

## 2014-10-29 MED ORDER — PENTAFLUOROPROP-TETRAFLUOROETH EX AERO
1.0000 "application " | INHALATION_SPRAY | CUTANEOUS | Status: DC | PRN
  Filled 2014-10-29: qty 30

## 2014-10-29 MED ORDER — NEPRO/CARBSTEADY PO LIQD: 237.0000 mL | ORAL | Status: DC | PRN

## 2014-10-29 MED ORDER — ALTEPLASE 2 MG IJ SOLR
2.0000 mg | Freq: Once | INTRAMUSCULAR | Status: AC | PRN
  Filled 2014-10-29: qty 2

## 2014-10-29 MED ORDER — LIDOCAINE-PRILOCAINE 2.5-2.5 % EX CREA
1.0000 "application " | TOPICAL_CREAM | CUTANEOUS | Status: DC | PRN
  Filled 2014-10-29: qty 5

## 2014-10-29 MED ORDER — LIDOCAINE HCL (PF) 1 % IJ SOLN: 5.0000 mL | INTRAMUSCULAR | Status: DC | PRN

## 2014-10-29 MED ORDER — LORAZEPAM 2 MG/ML IJ SOLN
0.5000 mg | Freq: Once | INTRAMUSCULAR | Status: AC
Start: 2014-10-29 — End: 2014-10-29
  Administered 2014-10-29: 0.5 mg via INTRAVENOUS
  Filled 2014-10-29: qty 1

## 2014-10-29 NOTE — Progress Notes (Signed)
Subjective: Interval History: Patient has some nausea but no vomiting and feeling better  Objective: Vital signs in last 24 hours: Temp:  [97.4 F (36.3 C)-97.8 F (36.6 C)] 97.8 F (36.6 C) (03/21 0400) Pulse Rate:  [43-65] 59 (03/21 0600) Resp:  [8-19] 14 (03/21 0600) BP: (73-95)/(34-65) 92/65 mmHg (03/21 0600) SpO2:  [96 %-100 %] 100 % (03/21 0600) Weight:  [51.7 kg (113 lb 15.7 oz)] 51.7 kg (113 lb 15.7 oz) (03/21 0500) Weight change: 1.9 kg (4 lb 3 oz)  Intake/Output from previous day: 03/20 0701 - 03/21 0700 In: 1456.3 [P.O.:240; I.V.:816.3; IV Piggyback:400] Out: 900 [Urine:900] Intake/Output this shift:    Generally patient is alert and in no apparent distress. Chest: Decreased breath sounds bilaterally no rales, rhonchi or egophony Heart exam regular rate and rhythm Abdomen: Somewhat distended, nontender and positive bowel sound Extremities: She has 1+ edema  Lab Results:  Recent Labs  10/28/14 0429 10/29/14 0433  WBC 6.3 5.6  HGB 10.2* 9.9*  HCT 30.2* 30.6*  PLT 80* 79*   BMET:   Recent Labs  10/28/14 0429 10/29/14 0433  NA 138 138  K 3.6 3.3*  CL 104 103  CO2 23 22  GLUCOSE 104* 96  BUN 54* 56*  CREATININE 3.54* 4.06*  CALCIUM 9.1 8.9   No results for input(s): PTH in the last 72 hours. Iron Studies: No results for input(s): IRON, TIBC, TRANSFERRIN, FERRITIN in the last 72 hours.  Studies/Results: No results found.  I have reviewed the patient's current medications.  Assessment/Plan: Problem #1 acute kidney injury superimposed on chronic. Patient dialysis was on hold however her BUN and creatinine continued to increase. Patient remained nonoliguric. Problem #2 Esophageal varices bleeding: No hematemesis. Her hemoglobin is slightly low but stable. Problem #4 liver cirrhosis with ascites. Patient at this moment with some ascites and edema.. She is nonoliguric Problem #5 metabolic bone disease: Her calcium is range Problem #6 hypokalemia:  Her potassium is low. Plan: We'll make arrangements for patient to get dialysis for today. Check her basic metabolic panel and CBC in the morning   LOS: 4 days   Magan Winnett S 10/29/2014,7:38 AM

## 2014-10-29 NOTE — Progress Notes (Signed)
    Subjective: Feels overall improved from admission. Afebrile. Intermittent nausea, no vomiting. Notes lower abdominal discomfort. Hernia uncomfortable. No overt signs of GI bleeding. BP overall improved, systolic in 27P.   Objective: Vital signs in last 24 hours: Temp:  [97.4 F (36.3 C)-97.8 F (36.6 C)] 97.8 F (36.6 C) (03/21 0400) Pulse Rate:  [43-65] 59 (03/21 0600) Resp:  [8-19] 14 (03/21 0600) BP: (73-95)/(34-65) 92/65 mmHg (03/21 0600) SpO2:  [96 %-100 %] 100 % (03/21 0600) Weight:  [113 lb 15.7 oz (51.7 kg)] 113 lb 15.7 oz (51.7 kg) (03/21 0500) Last BM Date: 10/25/14 General:   Alert and oriented, flat affect Head:  Normocephalic and atraumatic. Abdomen:  Bowel sounds present, non-tense ascites, umbilical hernia reducible, TTP all four quadrants but without rebound. Mildly distended but without tense ascites Extremities:  Without edema. Neurologic:  Alert and  oriented x4  Intake/Output from previous day: 03/20 0701 - 03/21 0700 In: 1456.3 [P.O.:240; I.V.:816.3; IV Piggyback:400] Out: 900 [Urine:900] Intake/Output this shift:    Lab Results:  Recent Labs  10/27/14 0526 10/28/14 0429 10/29/14 0433  WBC 6.7 6.3 5.6  HGB 10.3* 10.2* 9.9*  HCT 31.3* 30.2* 30.6*  PLT 71* 80* 79*   BMET  Recent Labs  10/27/14 0526 10/28/14 0429 10/29/14 0433  NA 139 138 138  K 3.1* 3.6 3.3*  CL 103 104 103  CO2 25 23 22   GLUCOSE 92 104* 96  BUN 47* 54* 56*  CREATININE 2.68* 3.54* 4.06*  CALCIUM 9.0 9.1 8.9   LFT  Recent Labs  10/26/14 0834  PROT 6.2  ALBUMIN 3.0*  AST 67*  ALT 21  ALKPHOS 75  BILITOT 3.5*   Lab Results  Component Value Date   TSH 3.042 10/28/2014    Assessment: 39 year old female with decompensated NASH cirrhosis, admitted with GI bleed likely secondary to portal gastropathy, with variceal banding X 2 at bedside. Overall clinically improved since admission without further signs of overt GI bleeding. Cr continues to rise, will have  dialysis today. Overall, BP improved from yesterday. Non-tense ascites noted, abdominal discomfort at baseline. Afebrile. Doubt SBP but low threshold for diagnostic paracentesis if recurrent tense ascites, worsening abdominal pain, worsening hypotension. Will monitor closely.      Plan: PPI po BID Cipro for total of 7 days Follow-up on pending cortisol level Dialysis today Low threshold for diagnostic paracentesis if clinically indicated: currently stable Will continue to follow   Orvil Feil, ANP-BC [facility]     LOS: 4 days    10/29/2014, 7:53 AM

## 2014-10-29 NOTE — Progress Notes (Signed)
[facility] PROGRESS NOTE  JASZMINE NAVEJAS NFA:[id number] DOB: 1976-07-10 DOA: 10/25/2014 PCP: [place]  Assessment/Plan: 1. Hematemesis secondary to diffuse gastric mucosal hemorrhage from portal gastropathy 1. S/p egd in ED 3/17 and was initially intubated for airway protection, remains extubated 2. Noted to have 2 columns of grade 2 esophageal varices w/o active bleed s/p band ligation with portal gastropathy 3. Continuing on BID PPI 4. GI recs to cont cipro qday x 7 days 5. Stopped octreotide on 3/20 at 2200 2. Acute blood loss anemia 1. Secondary to above 2. Hgb remains stable thus far 3. NASH with cirrhosis 1. Stopped octreotide on 3/20 at 2200 per GI recs 2. S/p US guided paracentesis on 3/18, paracentesis PRN per GI 4. ESRD 1. Nephrology following 2. Recs for HD today 5. Thrombocytopenia 1. Likely secondary to cirrhosis 2. Monitor for now 3. Remains stable 6. DVT prophylaxis 1. SCD's  Code Status: Full Family Communication: Pt in room Disposition Plan: Pending   Consultants:  Nephrology  GI  Pulmonary  Procedures:  EGD 3/18  US guided paracentesis 3/18  Antibiotics:  none  HPI/Subjective: Continues with mild abd discomfort. No acute events noted  Objective: Filed Vitals:   10/29/14 1400 10/29/14 1500 10/29/14 1600 10/29/14 1706  BP: 71/38 86/44 82/40    Pulse: 45 45    Temp:    98 F (36.7 C)  TempSrc:    Oral  Resp: 10 10 11    Height:      Weight:      SpO2: 95% 96%      Intake/Output Summary (Last 24 hours) at 10/29/14 1730 Last data filed at 10/29/14 0600  Gross per 24 hour  Intake 1256.33 ml  Output    550 ml  Net 706.33 ml   Filed Weights   10/27/14 0500 10/28/14 0500 10/29/14 0500  Weight: 49.8 kg (109 lb 12.6 oz) 49.8 kg (109 lb 12.6 oz) 51.7 kg (113 lb 15.7 oz)    Exam:   General:  Awake, intubated, in nad, nodding appropriately to questions  Cardiovascular: regular, s1, s2  Respiratory:  normal resp effort, no wheezing  Abdomen: soft, nondistended  Musculoskeletal: perfused, no clubbing   Data Reviewed: Basic Metabolic Panel:  Recent Labs Lab 10/25/14 2005 10/26/14 0834 10/27/14 0526 10/27/14 0830 10/28/14 0429 10/29/14 0433  NA 138 139 139  --  138 138  K 3.5 3.4* 3.1*  --  3.6 3.3*  CL 102 104 103  --  104 103  CO2 27 24 25   --  23 22  GLUCOSE 92 92 92  --  104* 96  BUN 34* 42* 47*  --  54* 56*  CREATININE 1.35* 1.96* 2.68*  --  3.54* 4.06*  CALCIUM 8.7 8.8 9.0  --  9.1 8.9  MG  --   --   --  1.5  --   --    Liver Function Tests:  Recent Labs Lab 10/25/14 2005 10/26/14 0834  AST 71* 67*  ALT 24 21  ALKPHOS 76 75  BILITOT 2.1* 3.5*  PROT 6.4 6.2  ALBUMIN 3.0* 3.0*    Recent Labs Lab 10/25/14 2005  LIPASE 82*   No results for input(s): AMMONIA in the last 168 hours. CBC:  Recent Labs Lab 10/25/14 2005 10/26/14 0834 10/27/14 0526 10/28/14 0429 10/29/14 0433  WBC 6.8 6.7 6.7 6.3 5.6  NEUTROABS 5.2  --   --   --   --   HGB 8.2* 10.1* 10.3* 10.2*  9.9*  HCT 25.1* 30.5* 31.3* 30.2* 30.6*  MCV 102.0* 98.4 98.7 100.0 101.3*  PLT 81* 74* 71* 80* 79*   Cardiac Enzymes: No results for input(s): CKTOTAL, CKMB, CKMBINDEX, TROPONINI in the last 168 hours. BNP (last 3 results) No results for input(s): BNP in the last 8760 hours.  ProBNP (last 3 results) No results for input(s): PROBNP in the last 8760 hours.  CBG: No results for input(s): GLUCAP in the last 168 hours.  Recent Results (from the past 240 hour(s))  MRSA PCR Screening     Status: None   Collection Time: 10/26/14  1:20 AM  Result Value Ref Range Status   MRSA by PCR NEGATIVE NEGATIVE Final    Comment:        The GeneXpert MRSA Assay (FDA approved for NASAL specimens only), is one component of a comprehensive MRSA colonization surveillance program. It is not intended to diagnose MRSA infection nor to guide or monitor treatment for MRSA infections.       Studies: No results found.  Scheduled Meds: . sodium chloride   Intravenous Once  . ciprofloxacin  500 mg Oral Q24H  . folic acid  1 mg Oral Daily  . midodrine  10 mg Oral TID WC  . ondansetron (ZOFRAN) IV  4 mg Intravenous TID AC  . pantoprazole  40 mg Oral BID AC  . sodium chloride  3 mL Intravenous Q12H   Continuous Infusions: . sodium chloride    . sodium chloride    . sodium chloride 20 mL/hr at 10/27/14 1900    Active Problems:   Esophageal varices   ESRD (end stage renal disease) on dialysis   Anemia associated with acute blood loss   Cirrhosis of liver with ascites   Hematemesis   Endotracheal tube present   Hematemesis with nausea   Ascites   [facility], [place] [facility] Pager [phone]. If 7PM-7AM, please contact night-coverage at [email or web address], password [facility] 10/29/2014, 5:30 PM  LOS: 4 days

## 2014-10-30 LAB — BASIC METABOLIC PANEL
ANION GAP: 12 (ref 5–15)
BUN: 23 mg/dL (ref 6–23)
CHLORIDE: 105 mmol/L (ref 96–112)
CO2: 25 mmol/L (ref 19–32)
Calcium: 8.8 mg/dL (ref 8.4–10.5)
Creatinine, Ser: 2.48 mg/dL — ABNORMAL HIGH (ref 0.50–1.10)
GFR calc Af Amer: 27 mL/min — ABNORMAL LOW (ref >90)
GFR calc non Af Amer: 23 mL/min — ABNORMAL LOW (ref >90)
Glucose, Bld: 73 mg/dL (ref 70–99)
Potassium: 4 mmol/L (ref 3.5–5.1)
Sodium: 142 mmol/L (ref 135–145)

## 2014-10-30 LAB — CBC
HCT: 31.3 % — ABNORMAL LOW (ref 36.0–46.0)
Hemoglobin: 10.1 g/dL — ABNORMAL LOW (ref 12.0–15.0)
MCH: 32.9 pg (ref 26.0–34.0)
MCHC: 32.3 g/dL (ref 30.0–36.0)
MCV: 102 fL — AB (ref 78.0–100.0)
Platelets: 63 10*3/uL — ABNORMAL LOW (ref 150–400)
RBC: 3.07 MIL/uL — ABNORMAL LOW (ref 3.87–5.11)
RDW: 18.7 % — AB (ref 11.5–15.5)
WBC: 6 10*3/uL (ref 4.0–10.5)

## 2014-10-30 LAB — PHOSPHORUS: Phosphorus: 3.3 mg/dL (ref 2.3–4.6)

## 2014-10-30 LAB — HEPATITIS B SURFACE ANTIGEN: HEP B S AG: NEGATIVE

## 2014-10-30 MED ORDER — PRO-STAT SUGAR FREE PO LIQD
30.0000 mL | Freq: Two times a day (BID) | ORAL | Status: DC
  Administered 2014-10-30: 30 mL via ORAL
  Filled 2014-10-30 (×2): qty 30

## 2014-10-30 NOTE — Progress Notes (Addendum)
INITIAL NUTRITION ASSESSMENT  DOCUMENTATION CODES Per approved criteria  -Severe malnutrition in the context of chronic illness   INTERVENTION: Nepro Shake po BID, each supplement provides 425 kcal and 19 grams protein   ProStat 30 ml BID (each 30 ml provides 100 kcal, 15 gr protein)   NUTRITION DIAGNOSIS: Increased protein needs related to chronic dz, esophogeal varicies as evidenced by GIB, ESRD with HD  Goal: Pt to meet >/= 90% of their estimated nutrition needs    Monitor:  Meal/supplement intake, labs and weight changes  Reason for Assessment: Malnutrition Screen   39 y.o. female  ASSESSMENT: pt has hx of decompensated NASH  cirrhosis, ascites, paracentesis, esophogeal varices (grade 2) s/p band ligation w portal gastropathy. She presents with GIB. S/p EGD 3/17.   ESRD with last HD tx (3/21). Last BM 10/25/14.   RD assessment 07/04/14: pt weight 120#. Hx of severe malnutrition, poor po intake and weight loss. Her home diet is regular. She is able to feed herself.  Height: Ht Readings from Last 1 Encounters:  10/26/14 5' (1.524 m)    Weight: Wt Readings from Last 1 Encounters:  10/30/14 115 lb 15.4 oz (52.6 kg)    Ideal Body Weight: 100#  % Ideal Body Weight: 116%  Wt Readings from Last 10 Encounters:  10/30/14 115 lb 15.4 oz (52.6 kg)  10/24/14 150 lb (68.04 kg)  09/18/14 119 lb 9.6 oz (54.25 kg)  09/12/14 116 lb (52.617 kg)  09/05/14 122 lb 6.4 oz (55.52 kg)  08/28/14 127 lb 11.2 oz (57.924 kg)  07/05/14 125 lb 14.1 oz (57.1 kg)  06/13/14 114 lb (51.71 kg)  05/18/14 117 lb 4.6 oz (53.2 kg)  04/25/14 136 lb 0.4 oz (61.7 kg)    Usual Body Weight: 150#  % Usual Body Weight: 77%  BMI:  Body mass index is 22.65 kg/(m^2). normal range  Estimated Nutritional Needs: Kcal: 1590-1855 Protein: 80 gr Fluid: 1200 ml  Skin: intact  Diet Order: DIET DYS 3  (po 75%)  EDUCATION NEEDS: -Education needs addressed related to renal disease last admission  (08/24/14)   Intake/Output Summary (Last 24 hours) at 10/30/14 1553 Last data filed at 10/30/14 0929  Gross per 24 hour  Intake    240 ml  Output    464 ml  Net   -224 ml    Last BM:  10/25/14  Labs:   Recent Labs Lab 10/27/14 0830 10/28/14 0429 10/29/14 0433 10/30/14 0500  NA  --  138 138 142  K  --  3.6 3.3* 4.0  CL  --  104 103 105  CO2  --  23 22 25   BUN  --  54* 56* 23  CREATININE  --  3.54* 4.06* 2.48*  CALCIUM  --  9.1 8.9 8.8  MG 1.5  --   --   --   PHOS  --   --   --  3.3  GLUCOSE  --  104* 96 73    CBG (last 3)  No results for input(s): GLUCAP in the last 72 hours.  Scheduled Meds: . sodium chloride   Intravenous Once  . ciprofloxacin  500 mg Oral Q24H  . folic acid  1 mg Oral Daily  . midodrine  10 mg Oral TID WC  . ondansetron (ZOFRAN) IV  4 mg Intravenous TID AC  . pantoprazole  40 mg Oral BID AC  . sodium chloride  3 mL Intravenous Q12H    Continuous Infusions: . sodium chloride    .  sodium chloride    . sodium chloride 20 mL/hr at 10/27/14 1900    Past Medical History  Diagnosis Date  . GERD (gastroesophageal reflux disease)   . 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 c/w steatohepatitis  . Folate deficiency 09/2013  . Anasarca 10/10/2013  . Hematemesis/vomiting blood 02/24/2014  . Acute blood loss anemia 02/25/2014    Status post transfusion  . Macrocytosis 02/28/2014  . 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 Acute renal failure 09/2013    Pre-renal- resolved  . C. difficile colitis 04/19/2014  . Anxiety   . Depression   . Thrombocytopenia     Hypercellular bone marrow; abundant megakaryocytes per 08/27/2014; s/p bone marrow bx  . Cirrhosis of liver with ascites   . SBP (spontaneous bacterial peritonitis) 11/10/2013  . Bipolar disorder 12/04/2013    2007-SEEN IN ED FOR INVOLUNTARY COMMITMENT, UDS POS FOR AMPHETAMINES/OPIATES   . PNA (pneumonia) 10/13/2013  . Chronic hypotension    . Gastroesophageal junction ulcer 09/17/2014  . ESRD (end stage renal disease) on dialysis 08/2014    Past Surgical History  Procedure Laterality Date  . None    . Paracentesis  Feb 2015    1180 fluid, negative fluid analysis.   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 Paracentesis  10/2013  . Colonoscopy N/A 12/19/2013    SLF:NO OBVIOUS SOURCE FOR ANEMIA IDETIFIED/ONE COLON POLYP REMOVED/Small internal hemorrhoids  . Esophagogastroduodenoscopy N/A 02/11/2014    Dr. Rourk:Esophageal varices with bleeding stigmata-status post esophageal band ligation therapy. Portal gastropathy  . Esophagogastroduodenoscopy N/A 07/04/2014    RMR: Persiting grade 2 esophageal varicies with bleeding stigmata status post band ligation. Significantly congested gastric mucosa iwith changes constistant with protal gastropathy.   . Esophageal banding  07/04/2014    Procedure: ESOPHAGEAL BANDING;  Surgeon: Daneil Dolin, MD;  Location: [facility];  Service: Endoscopy;;  . Esophagogastroduodenoscopy (egd) with propofol N/A 07/24/2014    SLF:  1. 2 columns grade 2-3 varices- 2 Bands applied.  2.  Moderate gastropathy 3. Duodenal Diverticula  . Esophageal banding N/A 07/24/2014    Procedure: ESOPHAGEAL BANDING (2 bands applied);  Surgeon: Danie Binder, MD;  Location: [facility];  Service: Endoscopy;  Laterality: N/A;  . Central venous catheter insertion Right   . Esophagogastroduodenoscopy N/A 09/16/2014    Procedure: ESOPHAGOGASTRODUODENOSCOPY (EGD) AT BEDSIDE;  Surgeon: Rogene Houston, MD;  Location: [facility];  Service: Endoscopy;  Laterality: N/A;  . Esophagogastroduodenoscopy (egd) with propofol N/A 10/26/2014    Procedure: ESOPHAGOGASTRODUODENOSCOPY (EGD) WITH PROPOFOL in ICU;  Surgeon: Daneil Dolin, MD;  Location: [facility];  Service: Endoscopy;  Laterality: N/A;   bedside ICU EGD intubated     Colman Cater MS,RD,CSG,LDN Office: [phone] Pager: [phone]

## 2014-10-30 NOTE — Progress Notes (Signed)
[facility] PROGRESS NOTE  Wanda [name] MWU:[id number] DOB: Nov 22, 1975 DOA: 10/25/2014 PCP: [place]  Assessment/Plan: 1. Hematemesis secondary to diffuse gastric mucosal hemorrhage from portal gastropathy 1. S/p egd in ED 3/17 and was initially intubated for airway protection, remains extubated 2. Noted to have 2 columns of grade 2 esophageal varices w/o active bleed s/p band ligation with portal gastropathy 3. Remains on BID PPI 4. GI recs to cont cipro qday x 7 days total 5. Stopped octreotide on 3/20 at 2200 2. Acute blood loss anemia 1. Secondary to above 2. Hgb remains stable thus far with no signs of active bleed 3. NASH with cirrhosis 1. Stopped octreotide on 3/20 at 2200 per GI recs 2. S/p US guided paracentesis on 3/18, paracentesis PRN per GI 4. ESRD 1. Nephrology following 2. Pt with HD on 3/21 5. Thrombocytopenia 1. Likely secondary to cirrhosis 2. Monitor for now 3. Holding stable thus far 6. DVT prophylaxis 1. SCD's  Code Status: Full Family Communication: Pt in room Disposition Plan: Pending   Consultants:  Nephrology  GI  Pulmonary  Procedures:  EGD 3/18  US guided paracentesis 3/18  Antibiotics:  none  HPI/Subjective: No complaints this AM. Eating breakfast  Objective: Filed Vitals:   10/30/14 0600 10/30/14 0700 10/30/14 0746 10/30/14 0800  BP:    89/41  Pulse: 61 62    Temp:   98.3 F (36.8 C)   TempSrc:   Axillary   Resp: 14 16  15   Height:      Weight:      SpO2: 96% 97%      Intake/Output Summary (Last 24 hours) at 10/30/14 0858 Last data filed at 10/30/14 0500  Gross per 24 hour  Intake      0 ml  Output    464 ml  Net   -464 ml   Filed Weights   10/28/14 0500 10/29/14 0500 10/30/14 0500  Weight: 49.8 kg (109 lb 12.6 oz) 51.7 kg (113 lb 15.7 oz) 52.6 kg (115 lb 15.4 oz)    Exam:   General:  Awake, intubated, in nad, sitting in bed eating breakfast, nodding appropriately to  questions  Cardiovascular: regular, s1, s2  Respiratory: normal resp effort, no wheezing  Abdomen: soft, nondistended  Musculoskeletal: perfused, no clubbing   Neuro: No focal neurologic findings, strength/sensation intact throughout  Data Reviewed: Basic Metabolic Panel:  Recent Labs Lab 10/26/14 0834 10/27/14 0526 10/27/14 0830 10/28/14 0429 10/29/14 0433 10/30/14 0500  NA 139 139  --  138 138 142  K 3.4* 3.1*  --  3.6 3.3* 4.0  CL 104 103  --  104 103 105  CO2 24 25  --  23 22 25   GLUCOSE 92 92  --  104* 96 73  BUN 42* 47*  --  54* 56* 23  CREATININE 1.96* 2.68*  --  3.54* 4.06* 2.48*  CALCIUM 8.8 9.0  --  9.1 8.9 8.8  MG  --   --  1.5  --   --   --   PHOS  --   --   --   --   --  3.3   Liver Function Tests:  Recent Labs Lab 10/25/14 2005 10/26/14 0834  AST 71* 67*  ALT 24 21  ALKPHOS 76 75  BILITOT 2.1* 3.5*  PROT 6.4 6.2  ALBUMIN 3.0* 3.0*    Recent Labs Lab 10/25/14 2005  LIPASE 82*   No results for input(s): AMMONIA in the  last 168 hours. CBC:  Recent Labs Lab 10/25/14 2005 10/26/14 0834 10/27/14 0526 10/28/14 0429 10/29/14 0433 10/30/14 0500  WBC 6.8 6.7 6.7 6.3 5.6 6.0  NEUTROABS 5.2  --   --   --   --   --   HGB 8.2* 10.1* 10.3* 10.2* 9.9* 10.1*  HCT 25.1* 30.5* 31.3* 30.2* 30.6* 31.3*  MCV 102.0* 98.4 98.7 100.0 101.3* 102.0*  PLT 81* 74* 71* 80* 79* 63*   Cardiac Enzymes: No results for input(s): CKTOTAL, CKMB, CKMBINDEX, TROPONINI in the last 168 hours. BNP (last 3 results) No results for input(s): BNP in the last 8760 hours.  ProBNP (last 3 results) No results for input(s): PROBNP in the last 8760 hours.  CBG: No results for input(s): GLUCAP in the last 168 hours.  Recent Results (from the past 240 hour(s))  MRSA PCR Screening     Status: None   Collection Time: 10/26/14  1:20 AM  Result Value Ref Range Status   MRSA by PCR NEGATIVE NEGATIVE Final    Comment:        The GeneXpert MRSA Assay (FDA approved for NASAL  specimens only), is one component of a comprehensive MRSA colonization surveillance program. It is not intended to diagnose MRSA infection nor to guide or monitor treatment for MRSA infections.      Studies: No results found.  Scheduled Meds: . sodium chloride   Intravenous Once  . ciprofloxacin  500 mg Oral Q24H  . folic acid  1 mg Oral Daily  . midodrine  10 mg Oral TID WC  . ondansetron (ZOFRAN) IV  4 mg Intravenous TID AC  . pantoprazole  40 mg Oral BID AC  . sodium chloride  3 mL Intravenous Q12H   Continuous Infusions: . sodium chloride    . sodium chloride    . sodium chloride 20 mL/hr at 10/27/14 1900    Active Problems:   Esophageal varices   ESRD (end stage renal disease) on dialysis   Anemia associated with acute blood loss   Cirrhosis of liver with ascites   Hematemesis   Endotracheal tube present   Hematemesis with nausea   Ascites   Wanda [name], [place] [facility] Pager [phone]. If 7PM-7AM, please contact night-coverage at [email or web address], password [facility] 10/30/2014, 8:58 AM  LOS: 5 days

## 2014-10-30 NOTE — Progress Notes (Signed)
Wanda [name]  MRN: [id number]  DOB/AGE: [birth year] 39 y.o.  [facility] [place] date: 10/25/2014  Chief Complaint:  Chief Complaint  Patient presents with  . Hematemesis    S-Pt presented on  10/25/2014 with  Chief Complaint  Patient presents with  . Hematemesis  .     Pt offers no new complaints.    Pt says " NO more blood in emesis"  Meds . sodium chloride   Intravenous Once  . ciprofloxacin  500 mg Oral Q24H  . folic acid  1 mg Oral Daily  . midodrine  10 mg Oral TID WC  . ondansetron (ZOFRAN) IV  4 mg Intravenous TID AC  . pantoprazole  40 mg Oral BID AC  . sodium chloride  3 mL Intravenous Q12H     Physical Exam: Vital signs in last 24 hours: Temp:  [97.3 F (36.3 C)-98.5 F (36.9 C)] 98.3 F (36.8 C) (03/22 0746) Pulse Rate:  [44-79] 62 (03/22 0700) Resp:  [10-22] 15 (03/22 0800) BP: (71-145)/(33-120) 89/41 mmHg (03/22 0800) SpO2:  [95 %-100 %] 97 % (03/22 0700) Weight:  [115 lb 15.4 oz (52.6 kg)] 115 lb 15.4 oz (52.6 kg) (03/22 0500) Weight change: 1 lb 15.7 oz (0.9 kg) Last BM Date: 10/25/14  Intake/Output from previous day: 03/21 0701 - 03/22 0700 In: -  Out: 464 [Urine:350]     Physical Exam: General- pt is awake,alert, oriented to time place and person Resp- No acute REsp distress,NO Rhonchi, PC in situ CVS- S1S2 regular in rate and rhythm GIT- BS+, soft, distended+  EXT- NO LE Edema,NO Cyanosis   Lab Results: CBC  Recent Labs  10/29/14 0433 10/30/14 0500  WBC 5.6 6.0  HGB 9.9* 10.1*  HCT 30.6* 31.3*  PLT 79* 63*    BMET  Recent Labs  10/29/14 0433 10/30/14 0500  NA 138 142  K 3.3* 4.0  CL 103 105  CO2 22 25  GLUCOSE 96 73  BUN 56* 23  CREATININE 4.06* 2.48*  CALCIUM 8.9 8.8   Trend Creat 2016  4.0=> 2.48 ( HD yesterday)           4.89=>4.53=>5.529 (Initiated on HD 08/18/14 sec to Anasarca) 2015  2.15=>4.13=>1.68=>1.59=>2.12( SEpt admission)           1.15=>1.95=>1.19 ( July  admission)           2.3==>1.21=> 1.08 ( April admission)           3.11==>1.36==>1.18 ( February admission)  MICRO Recent Results (from the past 240 hour(s))  MRSA PCR Screening     Status: None   Collection Time: 10/26/14  1:20 AM  Result Value Ref Range Status   MRSA by PCR NEGATIVE NEGATIVE Final    Comment:        The GeneXpert MRSA Assay (FDA approved for NASAL specimens only), is one component of a comprehensive MRSA colonization surveillance program. It is not intended to diagnose MRSA infection nor to guide or monitor treatment for MRSA infections.       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 Will continue current care    B and E S 10/30/2014, 9:06 AM

## 2014-10-30 NOTE — Progress Notes (Addendum)
    Subjective: Lower abdominal discomfort unchanged, no N/V, no overt signs of GI bleeding. Tolerating diet in small amounts.   Objective: Vital signs in last 24 hours: Temp:  [97.3 F (36.3 C)-98.5 F (36.9 C)] 98.3 F (36.8 C) (03/22 0746) Pulse Rate:  [44-79] 62 (03/22 0700) Resp:  [8-22] 16 (03/22 0700) BP: (71-145)/(33-120) 87/62 mmHg (03/22 0500) SpO2:  [95 %-100 %] 97 % (03/22 0700) Weight:  [115 lb 15.4 oz (52.6 kg)] 115 lb 15.4 oz (52.6 kg) (03/22 0500) Last BM Date: 10/25/14 General:   Alert and oriented, sallow appearing Head:  Normocephalic and atraumatic. Eyes:  +scleral icterus Mouth:  Without lesions, mucosa pink and moist.  Abdomen:  Bowel sounds present, non-tense ascites, somewhat more distended than yesterday but still soft. Umbilical hernia  Extremities:  Without edema. Neurologic:  Alert and  oriented x4 Psych:  Alert and cooperative.   Intake/Output from previous day: 03/21 0701 - 03/22 0700 In: -  Out: 464 [Urine:350] Intake/Output this shift:    Lab Results:  Recent Labs  10/28/14 0429 10/29/14 0433 10/30/14 0500  WBC 6.3 5.6 6.0  HGB 10.2* 9.9* 10.1*  HCT 30.2* 30.6* 31.3*  PLT 80* 79* 63*   BMET  Recent Labs  10/28/14 0429 10/29/14 0433 10/30/14 0500  NA 138 138 142  K 3.6 3.3* 4.0  CL 104 103 105  CO2 23 22 25   GLUCOSE 104* 96 73  BUN 54* 56* 23  CREATININE 3.54* 4.06* 2.48*  CALCIUM 9.1 8.9 8.8    Assessment: 39 year old female with decompensated NASH cirrhosis, admitted with GI bleed likely secondary to portal gastropathy, with variceal banding X 2 at bedside. Overall clinically improved since admission without further signs of overt GI bleeding.  Non-tense ascites noted, abdominal discomfort at baseline. Afebrile. Will continue to follow, as she may need repeat paracentesis in near future. Low threshold for diagnostic paracentesis if recurrent tense ascites, worsening abdominal pain, worsening hypotension. (last  paracentesis 10/26/14 with 6.4 liters removed). Will monitor closely.   Plan: PPI po BID Cipro for total of 7 days Follow-up on pending cortisol level Low threshold for repeat diagnostic paracentesis if clinically indicated: currently stable. (fluid analysis not done 10/26/14)  Will continue to follow Outpatient [facility] evaluation at [facility].   Orvil Feil, ANP-BC [facility]enter[facility]       LOS: 5 days    10/30/2014, 7:48 AM    Attending note:  Patient ambulating in her ICU room. Transient nausea earlier today. Much improved from when I last saw her last at the time of her urgent bedside EGD.  Agree with above assessment and recommendations.

## 2014-10-30 NOTE — Evaluation (Signed)
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 She also says that she has had melena today. She feels somewhat lightheaded but not significantly so. She does have a history of esophageal varices and these of been banded in the past. Her last admission was approximately 5 weeks ago when she presented with a similar admission and at this time the source of the bleeding was suspected from a couple of ulcers in the gastroesophageal junction rather than varices. She apparently was scheduled to be seen by [facility] for liver transplant and she apparently had an appointment tomorrow.  Pt lives with her fiancee and is normally independent at home, usually uses a walker.  Clinical Impression  We received a consult due to bilateral foot drop and one for a functional evaluation.  Pt was lethargic but able to awaken and cooperate fully.  She is found to be deconditioned with LLE mildly weaker than the RLE.  The anterior tibialis is at least 3/5 strength and can hold resistance at end of range.  She has tight heel cords bilaterally with about a 5 degree deficit to neutral.  While in bed pt maintains her feet in PF (more than is normally seen).  She is able to ambulate with a walker for functional distance and the anterior tibilalis is activated during gait with no foot drag.  I have instructed pt in active ROM exercise for her ankles.  She declines positioning splints to maintain neutral ankle position in the bed as she likes to sleep on her side and they would probably make this uncomfortable.  I will keep her on my schedule and work with general conditioning and focus on ankle strengthening/stretching exercise.   Follow Up Recommendations No PT  follow up (per pt preference)    Equipment Recommendations  None recommended by PT    Recommendations for Other Services   none    Precautions / Restrictions Precautions Precautions: Fall Precaution Comments: generalized deconditioning Restrictions Weight Bearing Restrictions: No      Mobility  Bed Mobility Overal bed mobility: Modified Independent                Transfers Overall transfer level: Modified independent Equipment used: Rolling walker (2 wheeled)                Ambulation/Gait Ambulation/Gait assistance: Supervision Ambulation Distance (Feet): 200 Feet Assistive device: Rolling walker (2 wheeled) Gait Pattern/deviations: WFL(Within Functional Limits)   Gait velocity interpretation: Below normal speed for age/gender General Gait Details: no foot drop evidenced during gait  Stairs            Wheelchair Mobility    Modified Rankin (Stroke Patients Only)       Balance Overall balance assessment: No apparent balance deficits (not formally assessed)                                           Pertinent Vitals/Pain Pain Assessment: No/denies pain    Home Living Family/patient expects to be discharged to:: Private residence Living Arrangements: Spouse/significant other Available Help at Discharge: Family;Available 24 hours/day Type of Home: House Home Access: Level  entry     Home Layout: One level Home Equipment: Walker - 2 wheels      Prior Function Level of Independence: Independent with assistive device(s)         Comments: uses walker intermittently     Hand Dominance        Extremity/Trunk Assessment               Lower Extremity Assessment: Generalized weakness;LLE deficits/detail;RLE deficits/detail ( deconditioned for her age ) RLE Deficits / Details: DF is at -5 degrees, strength is 3/5 to 3+/5 in DF LLE Deficits / Details: DF is at -5 degrees, strength is at 3/5   LLE is mildly  weaker than RLE  Cervical / Trunk Assessment: Kyphotic  Communication   Communication: No difficulties  Cognition Arousal/Alertness: Awake/alert Behavior During Therapy: WFL for tasks assessed/performed Overall Cognitive Status: Within Functional Limits for tasks assessed                      General Comments      Exercises General Exercises - Lower Extremity Ankle Circles/Pumps: AROM;Both;10 reps;Supine Other Exercises Other Exercises: gastrocnemius stretch...demonstrated Other Exercises: ankle circumduction      Assessment/Plan    PT Assessment Patient needs continued PT services  PT Diagnosis Generalized weakness   PT Problem List Decreased strength;Decreased activity tolerance;Decreased mobility  PT Treatment Interventions Therapeutic exercise;Patient/family education;Gait training   PT Goals (Current goals can be found in the Care Plan section) Acute Rehab PT Goals Patient Stated Goal: none stated PT Goal Formulation: With patient Time For Goal Achievement: 11/06/14 Potential to Achieve Goals: Good    Frequency     Barriers to discharge   none    Co-evaluation               End of Session Equipment Utilized During Treatment: Gait belt Activity Tolerance: Patient tolerated treatment well Patient left: in chair;with call bell/phone within reach Nurse Communication: Mobility status         Time: 1594-5859 PT Time Calculation (min) (ACUTE ONLY): 43 min   Charges:   PT Evaluation $Initial PT Evaluation Tier I: 1 Procedure PT Treatments $Therapeutic Exercise: 8-22 mins   PT G CodesSable Feil 10/30/2014, 11:37 AM

## 2014-10-30 NOTE — Discharge Instructions (Signed)
Ankle Activities   To gain ankle range of motion: Place foot on ball and rotate only ankle to move ball. Sit on ball and begin to rock self gently in all directions. Do not move feet. Crawl through play tunnel, under furniture, etc. Write alphabet or draw shapes with feet in sand, or in air.  Copyright  VHI. All rights reserved.

## 2014-10-31 ENCOUNTER — Encounter (HOSPITAL_COMMUNITY): Payer: Self-pay

## 2014-10-31 ENCOUNTER — Inpatient Hospital Stay (HOSPITAL_COMMUNITY): Payer: Medicare Other

## 2014-10-31 DIAGNOSIS — K7031 Alcoholic cirrhosis of liver with ascites: Secondary | ICD-10-CM

## 2014-10-31 LAB — BODY FLUID CELL COUNT WITH DIFFERENTIAL
Eos, Fluid: 0 %
Lymphs, Fluid: 45 %
Monocyte-Macrophage-Serous Fluid: 49 % — ABNORMAL LOW (ref 50–90)
Neutrophil Count, Fluid: 5 % (ref 0–25)
Other Cells, Fluid: 0 %
Total Nucleated Cell Count, Fluid: 320 cu mm (ref 0–1000)

## 2014-10-31 LAB — BASIC METABOLIC PANEL
Anion gap: 9 (ref 5–15)
BUN: 31 mg/dL — ABNORMAL HIGH (ref 6–23)
CO2: 25 mmol/L (ref 19–32)
Calcium: 9 mg/dL (ref 8.4–10.5)
Chloride: 101 mmol/L (ref 96–112)
Creatinine, Ser: 3.36 mg/dL — ABNORMAL HIGH (ref 0.50–1.10)
GFR calc Af Amer: 19 mL/min — ABNORMAL LOW (ref >90)
GFR calc non Af Amer: 16 mL/min — ABNORMAL LOW (ref >90)
Glucose, Bld: 118 mg/dL — ABNORMAL HIGH (ref 70–99)
Potassium: 3.5 mmol/L (ref 3.5–5.1)
Sodium: 135 mmol/L (ref 135–145)

## 2014-10-31 MED ORDER — ALBUMIN HUMAN 25 % IV SOLN
25.0000 g | INTRAVENOUS | Status: AC
  Administered 2014-10-31: 25 g via INTRAVENOUS
  Filled 2014-10-31: qty 100

## 2014-10-31 NOTE — Procedures (Signed)
   HEMODIALYSIS TREATMENT NOTE:  3 hour heparin-free dialysis completed via right IJ tunneled catheter.  Exit site unremarkable.  Goal NOT met:  BP unable to tolerate any ultrafiltration, even at minimal rate of 250cc/hour.  353cc removed in 3 hours, 300cc NS given to reinfuse all blood.  Net UF 53cc.  Albumin given by primary RN before HD session.  Midodrine given 1 hour into session.  No improvement in BP.  Report called to Deno Etienne, RN.  Casilda Pickerill L. Deepika Decatur, RN, CDN

## 2014-10-31 NOTE — Procedures (Signed)
PreOperative Dx: Cirrhosis, ascites Postoperative Dx: Cirrhosis, ascites Procedure:   US guided paracentesis Radiologist:  Thornton Papas Anesthesia:  10 ml of 1% lidocaine Specimen:  4200 ml of amber colored ascitic fluid EBL:   < 1 ml Complications: None

## 2014-10-31 NOTE — Progress Notes (Signed)
PT Cancellation Note  Patient Details Name: Wanda [name] MRN: [id number] DOB: 07-18-1976   Cancelled Treatment:    Reason Eval/Treat Not Completed: Patient at procedure or test/unavailable   Demetrios Isaacs L 10/31/2014, 12:08 PM

## 2014-10-31 NOTE — Care Management Utilization Note (Signed)
UR completed 

## 2014-10-31 NOTE — Progress Notes (Signed)
    Subjective: Denies N/V. Feels like abdomen is more distended. Appetite comes and goes but tolerating diet overall. No overt signs of GI bleeding. Curious about when she is going home.   Objective: Vital signs in last 24 hours: Temp:  [97.8 F (36.6 C)-98.3 F (36.8 C)] 97.8 F (36.6 C) (03/23 0757) Pulse Rate:  [54-66] 66 (03/22 1100) Resp:  [10-18] 10 (03/23 0600) BP: (72-116)/(31-74) 104/59 mmHg (03/23 0600) SpO2:  [97 %-98 %] 98 % (03/22 1100) Weight:  [116 lb 13.5 oz (53 kg)] 116 lb 13.5 oz (53 kg) (03/23 0400) Last BM Date: 10/27/14 ( per pt.) General:   Alert and oriented, flat affect Head:  Normocephalic and atraumatic. Eyes:  +scleral icterus Abdomen:  Bowel sounds present, moderately distended, more tense ascites than yesterday's exam. Umbilical hernia noted Extremities:  Without edema. Neurologic:  Alert and  oriented x4 Skin:  Warm and dry, intact without significant lesions.  Psych:  Alert and cooperative. Normal mood and affect.  Intake/Output from previous day: 03/22 0701 - 03/23 0700 In: 1083 [P.O.:1080; I.V.:3] Out: 300 [Urine:300] Intake/Output this shift:    Lab Results:  Recent Labs  10/29/14 0433 10/30/14 0500  WBC 5.6 6.0  HGB 9.9* 10.1*  HCT 30.6* 31.3*  PLT 79* 63*   BMET  Recent Labs  10/29/14 0433 10/30/14 0500  NA 138 142  K 3.3* 4.0  CL 103 105  CO2 22 25  GLUCOSE 96 73  BUN 56* 23  CREATININE 4.06* 2.48*  CALCIUM 8.9 8.8   Assessment: 39 year old female with decompensated NASH cirrhosis, admitted with GI bleed likely secondary to portal gastropathy, with variceal banding X 2 at bedside. Overall clinically improved since admission without further signs of overt GI bleeding.Last paracentesis 10/26/14 with 6.4 liters removed. Patient more uncomfortable today with accumulation of ascites; will order paracentesis. Abdominal discomfort at baseline, remains afebrile, vitals stable. Dialysis today as well. Hopeful discharge tomorrow.      Plan: Paracentesis today. Will add cell count and cultures Cipro total of 7 days Follow-up with Dr. Oneida Alar as outpatient (already scheduled for 4/6 at 10:30) PPI BID Will have our office ensure appt for transplant evaluation at [facility] is rescheduled, as this was missed during this admission.    Orvil Feil, ANP-BC [facility] Gastroenterology     LOS: 6 days    10/31/2014, 8:16 AM  Attending note: Patient getting paracentesis when I came to visit in the ICU. We'll follow up on fluid analysis as it becomes available.

## 2014-10-31 NOTE — Progress Notes (Signed)
[facility] PROGRESS NOTE  Wanda [name] ZOX:[id number] DOB: February 06, 1976 DOA: 10/25/2014 PCP: [place]  Assessment/Plan: Hematemesis -Secondary to portal gastropathy from [place] cirrhosis. -Status post EGD on 317 with band ligation of 2 esophageal varices. -Continue twice a day PPI. -Remains on Cipro for planned 7 days total.  Acute blood loss anemia -Secondary to hematemesis. -Hemoglobin stable to slightly improved with no signs of active bleed.  Cirrhosis secondary to nonalcoholic steatohepatitis -With increased abdominal pain today, plan for repeat paracentesis for GI. -Had paracentesis on 3/18.  End-stage renal disease -Appreciate renal following. -On hemodialysis.  Thrombocytopenia -Secondary to cirrhosis. -Stable as of now.    Code Status: Full code  Family Communication: Patient only  Disposition Plan: : Home when ready  Consultants:  GI   Antibiotics:  Cipro   Subjective: Feels well, increased abdominal pain, anxious to go home   Objective: Filed Vitals:   10/31/14 0500 10/31/14 0600 10/31/14 0757 10/31/14 0800  BP: 96/42 104/59  96/44  Pulse:    62  Temp:   97.8 F (36.6 C)   TempSrc:   Oral   Resp: 12 10  13   Height:      Weight:      SpO2:    97%    Intake/Output Summary (Last 24 hours) at 10/31/14 1018 Last data filed at 10/31/14 0958  Gross per 24 hour  Intake    846 ml  Output    300 ml  Net    546 ml   Filed Weights   10/29/14 0500 10/30/14 0500 10/31/14 0400  Weight: 51.7 kg (113 lb 15.7 oz) 52.6 kg (115 lb 15.4 oz) 53 kg (116 lb 13.5 oz)    Exam:   General:  Alert, awake, oriented 3, no distress   Cardiovascular: Regular rate and rhythm   Respiratory: Clear to auscultation bilaterally   Abdomen: Distended, positive pain   Extremities: 1+ edema bilaterally  Neurologic:  Nonfocal  Data Reviewed: Basic Metabolic Panel:  Recent Labs Lab 10/27/14 0526 10/27/14 0830 10/28/14 0429  10/29/14 0433 10/30/14 0500 10/31/14 0942  NA 139  --  138 138 142 135  K 3.1*  --  3.6 3.3* 4.0 3.5  CL 103  --  104 103 105 101  CO2 25  --  23 22 25 25   GLUCOSE 92  --  104* 96 73 118*  BUN 47*  --  54* 56* 23 31*  CREATININE 2.68*  --  3.54* 4.06* 2.48* 3.36*  CALCIUM 9.0  --  9.1 8.9 8.8 9.0  MG  --  1.5  --   --   --   --   PHOS  --   --   --   --  3.3  --    Liver Function Tests:  Recent Labs Lab 10/25/14 2005 10/26/14 0834  AST 71* 67*  ALT 24 21  ALKPHOS 76 75  BILITOT 2.1* 3.5*  PROT 6.4 6.2  ALBUMIN 3.0* 3.0*    Recent Labs Lab 10/25/14 2005  LIPASE 82*   No results for input(s): AMMONIA in the last 168 hours. CBC:  Recent Labs Lab 10/25/14 2005 10/26/14 0834 10/27/14 0526 10/28/14 0429 10/29/14 0433 10/30/14 0500  WBC 6.8 6.7 6.7 6.3 5.6 6.0  NEUTROABS 5.2  --   --   --   --   --   HGB 8.2* 10.1* 10.3* 10.2* 9.9* 10.1*  HCT 25.1* 30.5* 31.3* 30.2* 30.6* 31.3*  MCV 102.0*  98.4 98.7 100.0 101.3* 102.0*  PLT 81* 74* 71* 80* 79* 63*   Cardiac Enzymes: No results for input(s): CKTOTAL, CKMB, CKMBINDEX, TROPONINI in the last 168 hours. BNP (last 3 results) No results for input(s): BNP in the last 8760 hours.  ProBNP (last 3 results) No results for input(s): PROBNP in the last 8760 hours.  CBG: No results for input(s): GLUCAP in the last 168 hours.  Recent Results (from the past 240 hour(s))  MRSA PCR Screening     Status: None   Collection Time: 10/26/14  1:20 AM  Result Value Ref Range Status   MRSA by PCR NEGATIVE NEGATIVE Final    Comment:        The GeneXpert MRSA Assay (FDA approved for NASAL specimens only), is one component of a comprehensive MRSA colonization surveillance program. It is not intended to diagnose MRSA infection nor to guide or monitor treatment for MRSA infections.      Studies: No results found.  Scheduled Meds: . sodium chloride   Intravenous Once  . albumin human  25 g Intravenous On Call  .  ciprofloxacin  500 mg Oral Q24H  . feeding supplement (PRO-STAT SUGAR FREE 64)  30 mL Oral BID  . folic acid  1 mg Oral Daily  . midodrine  10 mg Oral TID WC  . ondansetron (ZOFRAN) IV  4 mg Intravenous TID AC  . pantoprazole  40 mg Oral BID AC  . sodium chloride  3 mL Intravenous Q12H   Continuous Infusions: . sodium chloride    . sodium chloride    . sodium chloride 20 mL/hr at 10/27/14 1900    Active Problems:   Esophageal varices   ESRD (end stage renal disease) on dialysis   Anemia associated with acute blood loss   Cirrhosis of liver with ascites   Hematemesis   Endotracheal tube present   Hematemesis with nausea   Ascites    Time spent: 35 minutes . Greater than 50% of this time was spent in direct contact with the patient coordinating care.    Lelon Frohlich  [facility] Pager [phone]  If 7PM-7AM, please contact night-coverage at [email or web address], password [facility] 10/31/2014, 10:18 AM  LOS: 6 days

## 2014-10-31 NOTE — Progress Notes (Signed)
BLONDELL LAPERLE  MRN: [id number]  DOB/AGE: 1975-09-25 39 y.o.  [facility] date: 10/25/2014  Chief Complaint:  Chief Complaint  Patient presents with  . Hematemesis    S-Pt presented on  10/25/2014 with  Chief Complaint  Patient presents with  . Hematemesis  .     Pt offers no new complaints.    Pt says " I have  no blood in emesis yet"  Meds . sodium chloride   Intravenous Once  . albumin human  25 g Intravenous On Call  . ciprofloxacin  500 mg Oral Q24H  . feeding supplement (PRO-STAT SUGAR FREE 64)  30 mL Oral BID  . folic acid  1 mg Oral Daily  . midodrine  10 mg Oral TID WC  . ondansetron (ZOFRAN) IV  4 mg Intravenous TID AC  . pantoprazole  40 mg Oral BID AC  . sodium chloride  3 mL Intravenous Q12H     Physical Exam: Vital signs in last 24 hours: Temp:  [97.8 F (36.6 C)-98.3 F (36.8 C)] 97.8 F (36.6 C) (03/23 0757) Pulse Rate:  [62-66] 62 (03/23 0800) Resp:  [10-18] 13 (03/23 0800) BP: (74-116)/(31-74) 96/44 mmHg (03/23 0800) SpO2:  [97 %-98 %] 97 % (03/23 0800) Weight:  [116 lb 13.5 oz (53 kg)] 116 lb 13.5 oz (53 kg) (03/23 0400) Weight change: 14.1 oz (0.4 kg) Last BM Date: 10/27/14 ( per pt.)  Intake/Output from previous day: 03/22 0701 - 03/23 0700 In: 1083 [P.O.:1080; I.V.:3] Out: 300 [Urine:300] Total I/O In: 3 [I.V.:3] Out: -    Physical Exam: General- pt is awake,alert, oriented to time place and person Resp- No acute REsp distress,NO Rhonchi, PC in situ CVS- S1S2 regular in rate and rhythm GIT- BS+, soft, distended+  EXT- NO LE Edema,NO Cyanosis   Lab Results: CBC  Recent Labs  10/29/14 0433 10/30/14 0500  WBC 5.6 6.0  HGB 9.9* 10.1*  HCT 30.6* 31.3*  PLT 79* 63*    BMET  Recent Labs  10/30/14 0500 10/31/14 0942  NA 142 135  K 4.0 3.5  CL 105 101  CO2 25 25  GLUCOSE 73 118*  BUN 23 31*  CREATININE 2.48* 3.36*  CALCIUM 8.8 9.0   Trend Creat 2016  4.0=> 2.48 (  HD yesterday) =>3.36          4.89=>4.53=>5.529 (Initiated on HD 08/18/14 sec to Anasarca) 2015  2.15=>4.13=>1.68=>1.59=>2.12( SEpt admission)           1.15=>1.95=>1.19 ( July admission)           2.3==>1.21=> 1.08 ( April admission)           3.11==>1.36==>1.18 ( February admission)  MICRO Recent Results (from the past 240 hour(s))  MRSA PCR Screening     Status: None   Collection Time: 10/26/14  1:20 AM  Result Value Ref Range Status   MRSA by PCR NEGATIVE NEGATIVE Final    Comment:        The GeneXpert MRSA Assay (FDA approved for NASAL specimens only), is one component of a comprehensive MRSA colonization surveillance program. It is not intended to diagnose MRSA infection nor to guide or monitor treatment for MRSA infections.       Lab Results  Component Value Date   CALCIUM 9.0 10/31/2014   PHOS 3.3 10/30/2014       Impression: 1)Renal   AKI on CKD  Pt was earlier started  on HD on 08/18/14                Hd was held , creat continues to rise again                CKD sec to Multiple AKI                CKD since 2015                Will dialyze again today               2)CVS- Hemodynamically fragile               BP low                On Midodrine   3)Anemia HGb  Stable Admitted with GI bleed  stable  4)Liver- Cirrhosis Sec to Steato hepatitis   5)Electrolytes Hypokalemia    6)GI- admitted with GI bleeding s/p EGD showing Variceal bleeding  on  PPI    7)Acid base Co2 at goal  8) Thrombocytopenia-Stable Primary MD following  Plan:  Will dialyze today    BHUTANI,MANPREET S 10/31/2014, 10:18 AM

## 2014-10-31 NOTE — Progress Notes (Signed)
Paracentesis complete no signs of distress. 4200 ml amber colored ascites removed.

## 2014-11-01 LAB — CBC
HCT: 35 % — ABNORMAL LOW (ref 36.0–46.0)
HEMOGLOBIN: 10.9 g/dL — AB (ref 12.0–15.0)
MCH: 32.2 pg (ref 26.0–34.0)
MCHC: 31.1 g/dL (ref 30.0–36.0)
MCV: 103.6 fL — ABNORMAL HIGH (ref 78.0–100.0)
PLATELETS: 59 10*3/uL — AB (ref 150–400)
RBC: 3.38 MIL/uL — AB (ref 3.87–5.11)
RDW: 18.4 % — ABNORMAL HIGH (ref 11.5–15.5)
WBC: 7.9 10*3/uL (ref 4.0–10.5)

## 2014-11-01 LAB — COMPREHENSIVE METABOLIC PANEL
ALBUMIN: 4.2 g/dL (ref 3.5–5.2)
ALK PHOS: 63 U/L (ref 39–117)
ALT: 13 U/L (ref 0–35)
AST: 38 U/L — AB (ref 0–37)
Anion gap: 10 (ref 5–15)
BUN: 18 mg/dL (ref 6–23)
CO2: 27 mmol/L (ref 19–32)
Calcium: 9.2 mg/dL (ref 8.4–10.5)
Chloride: 101 mmol/L (ref 96–112)
Creatinine, Ser: 2.58 mg/dL — ABNORMAL HIGH (ref 0.50–1.10)
GFR calc Af Amer: 26 mL/min — ABNORMAL LOW (ref >90)
GFR calc non Af Amer: 22 mL/min — ABNORMAL LOW (ref >90)
Glucose, Bld: 97 mg/dL (ref 70–99)
Potassium: 3.7 mmol/L (ref 3.5–5.1)
Sodium: 138 mmol/L (ref 135–145)
Total Bilirubin: 2 mg/dL — ABNORMAL HIGH (ref 0.3–1.2)
Total Protein: 6.9 g/dL (ref 6.0–8.3)

## 2014-11-01 LAB — TRIGLYCERIDES: Triglycerides: 90 mg/dL (ref <150)

## 2014-11-01 LAB — PATHOLOGIST SMEAR REVIEW

## 2014-11-01 MED ORDER — OXYCODONE HCL 10 MG PO TABS: 10.0000 mg | ORAL_TABLET | Freq: Four times a day (QID) | ORAL | Status: DC | PRN

## 2014-11-01 MED ORDER — K PHOS MONO-SOD PHOS DI & MONO 155-852-130 MG PO TABS
500.0000 mg | ORAL_TABLET | Freq: Every day | ORAL | Status: DC
  Administered 2014-11-01: 500 mg via ORAL
  Filled 2014-11-01 (×2): qty 2

## 2014-11-01 MED ORDER — CIPROFLOXACIN HCL 500 MG PO TABS: 500.0000 mg | ORAL_TABLET | ORAL | Status: DC

## 2014-11-01 MED ORDER — LORAZEPAM 1 MG PO TABS: 1.0000 mg | ORAL_TABLET | Freq: Two times a day (BID) | ORAL | Status: DC | PRN

## 2014-11-01 NOTE — Progress Notes (Signed)
Subjective: Interval History: No new complaints. She has some nausea but no vomiting.  Objective: Vital signs in last 24 hours: Temp:  [97.6 F (36.4 C)-98.9 F (37.2 C)] 98.9 F (37.2 C) (03/24 0400) Pulse Rate:  [54-81] 56 (03/23 2200) Resp:  [11-20] 13 (03/24 0400) BP: (78-100)/(32-58) 79/32 mmHg (03/24 0400) SpO2:  [97 %-100 %] 98 % (03/23 2200) Weight:  [52 kg (114 lb 10.2 oz)-53 kg (116 lb 13.5 oz)] 52 kg (114 lb 10.2 oz) (03/24 0500) Weight change: 0 kg (0 lb)  Intake/Output from previous day: 03/23 0701 - 03/24 0700 In: 366 [P.O.:360; I.V.:6] Out: 578 [Urine:525] Intake/Output this shift:    General appearance: alert, cooperative and no distress Resp: clear to auscultation bilaterally Cardio: regular rate and rhythm, S1, S2 normal, no murmur, click, rub or gallop GI: soft, non-tender; bowel sounds normal; no masses,  no organomegaly Extremities: edema Trace edema  Lab Results:  Recent Labs  10/30/14 0500 11/01/14 0530  WBC 6.0 7.9  HGB 10.1* 10.9*  HCT 31.3* 35.0*  PLT 63* 59*   BMET:   Recent Labs  10/31/14 0942 11/01/14 0530  NA 135 138  K 3.5 3.7  CL 101 101  CO2 25 27  GLUCOSE 118* 97  BUN 31* 18  CREATININE 3.36* 2.58*  CALCIUM 9.0 9.2   No results for input(s): PTH in the last 72 hours. Iron Studies: No results for input(s): IRON, TIBC, TRANSFERRIN, FERRITIN in the last 72 hours.  Studies/Results: US Paracentesis  10/31/2014   CLINICAL DATA:  Cirrhosis, ascites  EXAM: ULTRASOUND GUIDED DIAGNOSTIC AND THERAPEUTIC PARACENTESIS  COMPARISON:  None.  PROCEDURE: Procedure, benefits, and risks of procedure were discussed with patient.  Written informed consent for procedure was obtained.  Time out protocol followed.  Adequate collection of ascites localized by ultrasound in RIGHT lower quadrant.  Skin prepped and draped in usual sterile fashion.  Skin and soft tissues anesthetized with 10 mL of 1% lidocaine.  5 [place] Yueh catheter placed into  peritoneal cavity.  4200 mL of amber color fluid aspirated by vacuum bottle suction.  Procedure tolerated well by patient without immediate complication.  FINDINGS: A total of approximately 4200 mL of ascitic fluid was removed. A fluid sample of 180 mL was sent for laboratory analysis.  IMPRESSION: Successful ultrasound guided paracentesis yielding 4200 mL of ascites.   Electronically Signed   By: Lavonia Dana M.D.   On: 10/31/2014 12:36    I have reviewed the patient's current medications.  Assessment/Plan: Problem #1 renal failure: She is status post hemodialysis history today. Potassium is normal. Problem #2 Esophageal varices bleeding: Patient does not have anymore bleeding. Her hemoglobin remains within TARGET goal. Problem #3 hypotension: On Midodrin no significant improvement. Problem #4 liver cirrhosis with ascites.  she is status post paracentesis yesterday. Her ascites has improved P.roblem #5 metabolic bone disease: Her calcium is range but phosphorus is 1.5 low. Possibly secondary to poor nutrition. Problem #6 hypokalemia: Her potassium has corrected. Plan: We'll make arrangements for patient to get dialysis tomorrow We'll start on K-Phos Neutra 1 tablet by mouth once a day Check her basic metabolic panel, phosphorus and CBC in the morning   LOS: 7 days   Kennice Finnie S 11/01/2014,8:06 AM

## 2014-11-01 NOTE — Progress Notes (Signed)
Subjective: Doing well today. Resting in the bed. Denies N/V, further bleeding. Abdominal pain is at baseline. Feels better after paracentesis yesterday, abdomen less full.  Objective: Vital signs in last 24 hours: Temp:  [97.6 F (36.4 C)-98.9 F (37.2 C)] 98.4 F (36.9 C) (03/24 0800) Pulse Rate:  [54-81] 56 (03/23 2200) Resp:  [10-20] 10 (03/24 0905) BP: (78-100)/(32-58) 90/38 mmHg (03/24 0905) SpO2:  [97 %-100 %] 98 % (03/23 2200) Weight:  [114 lb 10.2 oz (52 kg)-116 lb 13.5 oz (53 kg)] 114 lb 10.2 oz (52 kg) (03/24 0500) Last BM Date: 10/27/14 General:   Alert and oriented, pleasant Head:  Normocephalic and atraumatic. Eyes:  No icterus, sclera clear. Conjuctiva pink.  Heart:  S1, S2 present, no murmurs noted.  Lungs: Clear to auscultation bilaterally, without wheezing, rales, or rhonchi.  Abdomen:  Bowel sounds present, soft, baseline tenderness, non-distended. No HSM noted. No rebound or guarding. No masses appreciated. Umbilical hernia noted soft. No tense ascites.  Extremities:  Without clubbing or edema. Neurologic:  Alert and  oriented x4;  grossly normal neurologically. Skin:  Warm and dry, intact without significant lesions.  Psych:  Alert and cooperative. Normal mood and affect.  Intake/Output from previous day: 03/23 0701 - 03/24 0700 In: 366 [P.O.:360; I.V.:6] Out: 578 [Urine:525] Intake/Output this shift: Total I/O In: 3 [I.V.:3] Out: -   Lab Results:  Recent Labs  10/30/14 0500 11/01/14 0530  WBC 6.0 7.9  HGB 10.1* 10.9*  HCT 31.3* 35.0*  PLT 63* 59*   BMET  Recent Labs  10/30/14 0500 10/31/14 0942 11/01/14 0530  NA 142 135 138  K 4.0 3.5 3.7  CL 105 101 101  CO2 25 25 27   GLUCOSE 73 118* 97  BUN 23 31* 18  CREATININE 2.48* 3.36* 2.58*  CALCIUM 8.8 9.0 9.2   LFT  Recent Labs  11/01/14 0530  PROT 6.9  ALBUMIN 4.2  AST 38*  ALT 13  ALKPHOS 63  BILITOT 2.0*   PT/INR No results for input(s): LABPROT, INR in the last 72  hours. Hepatitis Panel  Recent Labs  10/29/14 2245  HEPBSAG NEGATIVE    Studies/Results: US Paracentesis  10/31/2014   CLINICAL DATA:  Cirrhosis, ascites  EXAM: ULTRASOUND GUIDED DIAGNOSTIC AND THERAPEUTIC PARACENTESIS  COMPARISON:  None.  PROCEDURE: Procedure, benefits, and risks of procedure were discussed with patient.  Written informed consent for procedure was obtained.  Time out protocol followed.  Adequate collection of ascites localized by ultrasound in RIGHT lower quadrant.  Skin prepped and draped in usual sterile fashion.  Skin and soft tissues anesthetized with 10 mL of 1% lidocaine.  5 [place] Yueh catheter placed into peritoneal cavity.  4200 mL of amber color fluid aspirated by vacuum bottle suction.  Procedure tolerated well by patient without immediate complication.  FINDINGS: A total of approximately 4200 mL of ascitic fluid was removed. A fluid sample of 180 mL was sent for laboratory analysis.  IMPRESSION: Successful ultrasound guided paracentesis yielding 4200 mL of ascites.   Electronically Signed   By: Lavonia Dana M.D.   On: 10/31/2014 12:36    Assessment: 39 year old female with decompensated NASH cirrhosis, admitted with GI bleed likely secondary to portal gastropathy, with variceal banding X 2 at bedside. Overall clinically improved since admission without further signs of overt GI bleeding.Last paracentesis yesterday with 4 liters removed. Patient more comfortable today after fluid removal. Abdominal discomfort at baseline, remains afebrile, vitals stable. Tolerating diet well at this point.  No further bleed noted. Fluid cell count unremarkable, culture pending.   Plan: 1. Monitor for fluid C&S results 2. Cipro total 7 days 3. Outpatient follow-up already scheduled with our office for 4/6 at 10:30 am 4. Continue PPI bid 5. Will arrange for make-up transplant evaluation at [facility] due to patient missing the previously scheduled one due to this  admission.    Walden Field, AGNP-C Adult & Gerontological Nurse Practitioner [facility]    LOS: 7 days    11/01/2014, 9:32 AM

## 2014-11-01 NOTE — Progress Notes (Signed)
PT Cancellation Note  Patient Details Name: Wanda [name] MRN: [id number] DOB: May 04, 1976  Therapy session refusal  Cancelled Treatment:    Reason Eval/Treat Not Completed: Other (comment) (Pt stated she did not feel up for therapy today, limited by stomach pain scale 9/10, RN aware and medicated)  Ihor Austin, [place]  Aldona Lento 11/01/2014, 10:06 AM

## 2014-11-01 NOTE — Discharge Summary (Signed)
Physician Discharge Summary  Wanda [name] JJK:[id number] DOB: 1976/01/27 DOA: 10/25/2014  PCP: [place] date: 10/25/2014 Discharge date: 11/01/2014  Time spent: 45 minutes  Recommendations for Outpatient Follow-up:  -Will be discharged home today. -Will follow up with GI as scheduled for April 6th.   Discharge Diagnoses:  Active Problems:   Esophageal varices   ESRD (end stage renal disease) on dialysis   Anemia associated with acute blood loss   Cirrhosis of liver with ascites   Hematemesis   Endotracheal tube present   Hematemesis with nausea   Ascites   Discharge Condition: Stable and improved  Filed Weights   10/31/14 0400 10/31/14 1550 11/01/14 0500  Weight: 53 kg (116 lb 13.5 oz) 53 kg (116 lb 13.5 oz) 52 kg (114 lb 10.2 oz)    History of present illness:  This is a 39 year old lady who has a history of cirrhosis of the liver secondary to NASH, now presents with one episode of a cupful of hematemesis at approximately 6 PM today. She also says that she has had melena today. She feels somewhat lightheaded but not significantly so. She does have a history of esophageal varices and these of been banded in the past. Her last admission was approximately 5 weeks ago when she presented with a similar admission and at this time the source of the bleeding was suspected from a couple of ulcers in the gastroesophageal junction rather than varices. She apparently was scheduled to be seen by [facility] for liver transplant and she apparently had an appointment tomorrow. She also has end-stage renal disease and is on hemodialysis. Evaluation in the emergency room shows a hemoglobin of 8.2, which is down from 10.5 approximately 5 weeks ago upon discharge. She is now being admitted for further management.  Hospital Course:   Hematemesis -Secondary to portal gastropathy from [place] cirrhosis. -Status post EGD on 3/17 with band ligation of 2 esophageal  varices. -Continue twice a day PPI.  Acute blood loss anemia -Secondary to hematemesis. -Hemoglobin stable to slightly improved with no signs of active bleed.  Cirrhosis secondary to nonalcoholic steatohepatitis -With increased abdominal pain and distention 3/23 had repeat paracentesis. -Had paracentesis on 3/18. -Plan to remain on cipro for 7 days following DC.  End-stage renal disease -Appreciate renal following. -On hemodialysis.  Thrombocytopenia -Secondary to cirrhosis. -Stable as of now.    Procedures:  EGD   Consultations:  GI  Discharge Instructions  Discharge Instructions    Increase activity slowly    Complete by:  As directed             Medication List    STOP taking these medications        magnesium oxide 400 (241.3 MG) MG tablet  Commonly known as:  MAG-OX      TAKE these medications        ciprofloxacin 500 MG tablet  Commonly known as:  CIPRO  Take 1 tablet (500 mg total) by mouth daily.     feeding supplement (NEPRO CARB STEADY) Liqd  Take 237 mLs by mouth as needed (missed meal during dialysis.).     folic acid 1 MG tablet  Commonly known as:  FOLVITE  Take 1 tablet (1 mg total) by mouth daily.     lactulose 10 GM/15ML solution  Commonly known as:  CHRONULAC  Take 15 mLs (10 g total) by mouth 2 (two) times daily.     lidocaine 2 % solution  Commonly known as:  XYLOCAINE  2 TSP  PO QAC AND HS TO PREVENT FOR CHEST PAIN WHILE EATING     loperamide 2 MG capsule  Commonly known as:  IMODIUM  Take 1 capsule (2 mg total) by mouth 2 (two) times daily as needed for diarrhea or loose stools.     LORazepam 1 MG tablet  Commonly known as:  ATIVAN  Take 1 tablet (1 mg total) by mouth every 12 (twelve) hours as needed for anxiety.     metolazone 5 MG tablet  Commonly known as:  ZAROXOLYN  Take 2 tablets (10 mg total) by mouth daily.     midodrine 10 MG tablet  Commonly known as:  PROAMATINE  Take 1 tablet (10 mg total) by mouth 2  (two) times daily with a meal. Medication to keep your blood pressure from falling.     Oxycodone HCl 10 MG Tabs  Take 1 tablet (10 mg total) by mouth every 6 (six) hours as needed.     pantoprazole 40 MG tablet  Commonly known as:  PROTONIX  Take 1 tablet (40 mg total) by mouth 2 (two) times daily.     promethazine 12.5 MG tablet  Commonly known as:  PHENERGAN  Take 1 tablet (12.5 mg total) by mouth every 6 (six) hours as needed for nausea or vomiting.       Allergies  Allergen Reactions  . Lasix [Furosemide]     "doesn't work"  . Morphine And Related Hives and Itching       Follow-up Information    Follow up with [name]. Schedule an appointment as soon as possible for a visit in 2 weeks.   Contact information:   [place] [address] [phone]       Follow up with Barney Drain, MD On 11/14/2014.   Specialty:  Gastroenterology   Why:  10:30 am   Contact information:   [place] [address] [place] [address] [phone]        The results of significant diagnostics from this hospitalization (including imaging, microbiology, ancillary and laboratory) are listed below for reference.    Significant Diagnostic Studies: Dg Chest 1 View  10/26/2014   CLINICAL DATA:  Endotracheal tube placement.  Initial encounter.  EXAM: CHEST  1 VIEW  COMPARISON:  Chest radiograph performed 07/04/2014  FINDINGS: The patient's endotracheal tube is seen ending 2 cm above the carina. The right IJ dual-lumen catheter is seen ending within the right atrium.  The lungs are well-aerated. Vascular congestion is noted, with mildly increased interstitial markings, possibly reflecting mild interstitial edema. There is no evidence of pleural effusion or pneumothorax.  The cardiomediastinal silhouette is within normal limits. No acute osseous abnormalities are seen.  IMPRESSION: 1. Endotracheal tube seen ending 2 cm above the carina. 2.  Vascular congestion, with mildly increased interstitial markings, possibly reflecting mild interstitial edema.   Electronically Signed   By: Garald Balding M.D.   On: 10/26/2014 01:43   US Paracentesis  10/31/2014   CLINICAL DATA:  Cirrhosis, ascites  EXAM: ULTRASOUND GUIDED DIAGNOSTIC AND THERAPEUTIC PARACENTESIS  COMPARISON:  None.  PROCEDURE: Procedure, benefits, and risks of procedure were discussed with patient.  Written informed consent for procedure was obtained.  Time out protocol followed.  Adequate collection of ascites localized by ultrasound in RIGHT lower quadrant.  Skin prepped and draped in usual sterile fashion.  Skin and soft tissues anesthetized with 10  mL of 1% lidocaine.  5 [place] Yueh catheter placed into peritoneal cavity.  4200 mL of amber color fluid aspirated by vacuum bottle suction.  Procedure tolerated well by patient without immediate complication.  FINDINGS: A total of approximately 4200 mL of ascitic fluid was removed. A fluid sample of 180 mL was sent for laboratory analysis.  IMPRESSION: Successful ultrasound guided paracentesis yielding 4200 mL of ascites.   Electronically Signed   By: Lavonia Dana M.D.   On: 10/31/2014 12:36   US Paracentesis  10/26/2014   CLINICAL DATA:  Cirrhosis, ascites  EXAM: ULTRASOUND GUIDED THERAPEUTIC PARACENTESIS  COMPARISON:  10/03/2014  PROCEDURE: Procedure, benefits, and risks of procedure were discussed with patient.  Written informed consent for procedure was obtained.  Time out protocol followed.  Procedure was performed portably.  Adequate collection of ascites localized by ultrasound in RIGHT lower quadrant.  Skin prepped and draped in usual sterile fashion.  Skin and soft tissues anesthetized with 10 mL of 1% lidocaine.  5 [place] Yueh catheter placed into peritoneal cavity.  6400 mL of yellow colored ascites aspirated by vacuum bottle suction.  Procedure tolerated well by patient without immediate complication.  FINDINGS: As above   IMPRESSION: Successful ultrasound guided paracentesis yielding 6400 mL of ascites.   Electronically Signed   By: Lavonia Dana M.D.   On: 10/26/2014 15:18   US Paracentesis  10/03/2014   CLINICAL DATA:  Cirrhosis, NASH, ascites  EXAM: ULTRASOUND GUIDED THERAPEUTIC PARACENTESIS  COMPARISON:  09/14/2014  PROCEDURE: Procedure, benefits, and risks of procedure were discussed with patient.  Written informed consent for procedure was obtained.  Time out protocol followed.  Adequate collection of ascites localized by ultrasound in RIGHT mid abdomen.  Skin prepped and draped in usual sterile fashion.  Skin and soft tissues anesthetized with 10 mL of 1% lidocaine.  5 [place] Yueh catheter placed into peritoneal cavity.  7000 mL of yellow ascitic fluid aspirated by vacuum bottle suction.  Procedure tolerated well by patient without immediate complication.  FINDINGS: As above  IMPRESSION: Successful ultrasound guided paracentesis yielding 7000 mL of ascites.   Electronically Signed   By: Lavonia Dana M.D.   On: 10/03/2014 14:58    Microbiology: Recent Results (from the past 240 hour(s))  MRSA PCR Screening     Status: None   Collection Time: 10/26/14  1:20 AM  Result Value Ref Range Status   MRSA by PCR NEGATIVE NEGATIVE Final    Comment:        The GeneXpert MRSA Assay (FDA approved for NASAL specimens only), is one component of a comprehensive MRSA colonization surveillance program. It is not intended to diagnose MRSA infection nor to guide or monitor treatment for MRSA infections.   Body fluid culture     Status: None (Preliminary result)   Collection Time: 10/31/14 11:15 AM  Result Value Ref Range Status   Specimen Description FLUID ASCITIC  Final   Special Requests NONE  Final   Gram Stain   Final    NO WBC SEEN NO ORGANISMS SEEN Performed at [facility]    Culture NO GROWTH Performed at [facility]   Final   Report Status PENDING  Incomplete     Labs: Basic Metabolic  Panel:  Recent Labs Lab 10/27/14 0830 10/28/14 0429 10/29/14 0433 10/30/14 0500 10/31/14 0942 11/01/14 0530  NA  --  138 138 142 135 138  K  --  3.6 3.3* 4.0 3.5 3.7  CL  --  104 103 105 101 101  CO2  --  23 22 25 25 27   GLUCOSE  --  104* 96 73 118* 97  BUN  --  54* 56* 23 31* 18  CREATININE  --  3.54* 4.06* 2.48* 3.36* 2.58*  CALCIUM  --  9.1 8.9 8.8 9.0 9.2  MG 1.5  --   --   --   --   --   PHOS  --   --   --  3.3  --   --    Liver Function Tests:  Recent Labs Lab 10/25/14 2005 10/26/14 0834 11/01/14 0530  AST 71* 67* 38*  ALT 24 21 13   ALKPHOS 76 75 63  BILITOT 2.1* 3.5* 2.0*  PROT 6.4 6.2 6.9  ALBUMIN 3.0* 3.0* 4.2    Recent Labs Lab 10/25/14 2005  LIPASE 82*   No results for input(s): AMMONIA in the last 168 hours. CBC:  Recent Labs Lab 10/25/14 2005  10/27/14 0526 10/28/14 0429 10/29/14 0433 10/30/14 0500 11/01/14 0530  WBC 6.8  < > 6.7 6.3 5.6 6.0 7.9  NEUTROABS 5.2  --   --   --   --   --   --   HGB 8.2*  < > 10.3* 10.2* 9.9* 10.1* 10.9*  HCT 25.1*  < > 31.3* 30.2* 30.6* 31.3* 35.0*  MCV 102.0*  < > 98.7 100.0 101.3* 102.0* 103.6*  PLT 81*  < > 71* 80* 79* 63* 59*  < > = values in this interval not displayed. Cardiac Enzymes: No results for input(s): CKTOTAL, CKMB, CKMBINDEX, TROPONINI in the last 168 hours. BNP: BNP (last 3 results) No results for input(s): BNP in the last 8760 hours.  ProBNP (last 3 results) No results for input(s): PROBNP in the last 8760 hours.  CBG: No results for input(s): GLUCAP in the last 168 hours.     SignedLelon Frohlich  [facility] Pager: [phone] 11/01/2014, 3:23 PM

## 2014-11-01 NOTE — Progress Notes (Signed)
Alert and oriented. Vital signs are within normal limits. Peripheral IV removed and clean dry intact skin after removal.  Patient has no complaints and agrees to the discharge teaching.  Patient has left the unit with nursing staff and family member.

## 2014-11-02 LAB — CORTISOL-AM, BLOOD: Cortisol - AM: 11 ug/dL (ref 4.3–22.4)

## 2014-11-02 NOTE — Care Management Utilization Note (Signed)
UR completed 

## 2014-11-04 LAB — BODY FLUID CULTURE
Culture: NO GROWTH
Gram Stain: NONE SEEN

## 2014-11-13 IMAGING — US US PARACENTESIS
1 series · 6 of 6 positions shown · non-contrast
Comparison: 02/28/2014

CLINICAL DATA: Cirrhosis, ascites

EXAM:
ULTRASOUND GUIDED DIAGNOSTIC AND THERAPEUTIC PARACENTESIS

[Series 1: us paracentesis · 0.21mm/px · 6 of 6 slices shown]
[im 1/6]
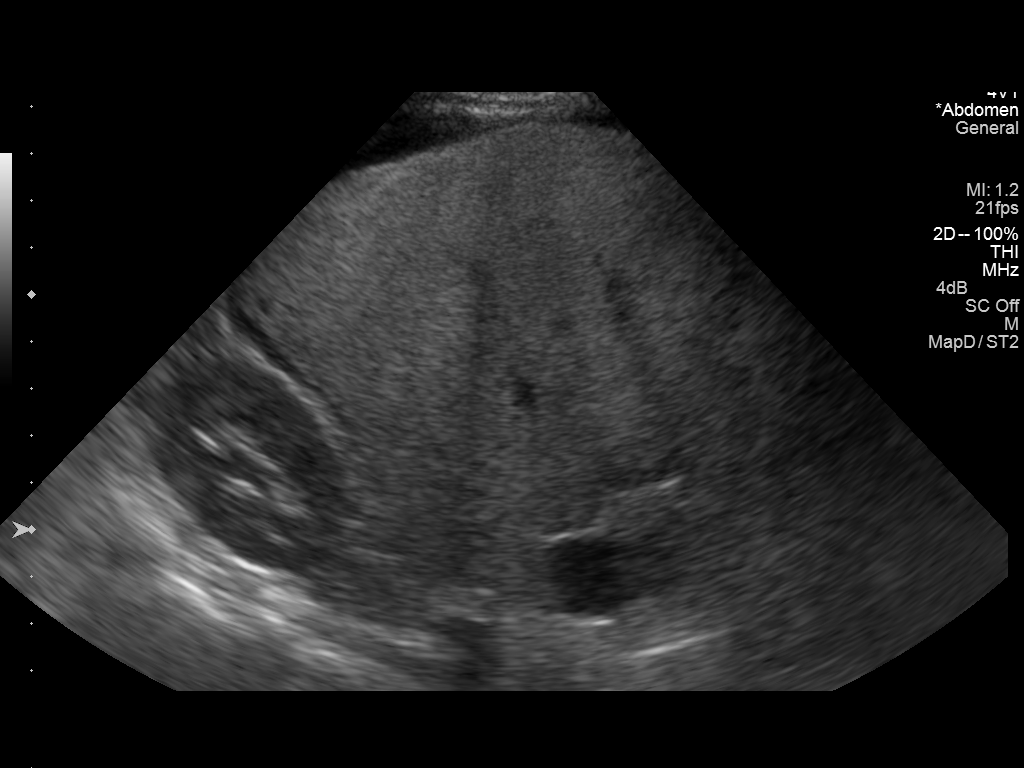
[im 2/6]
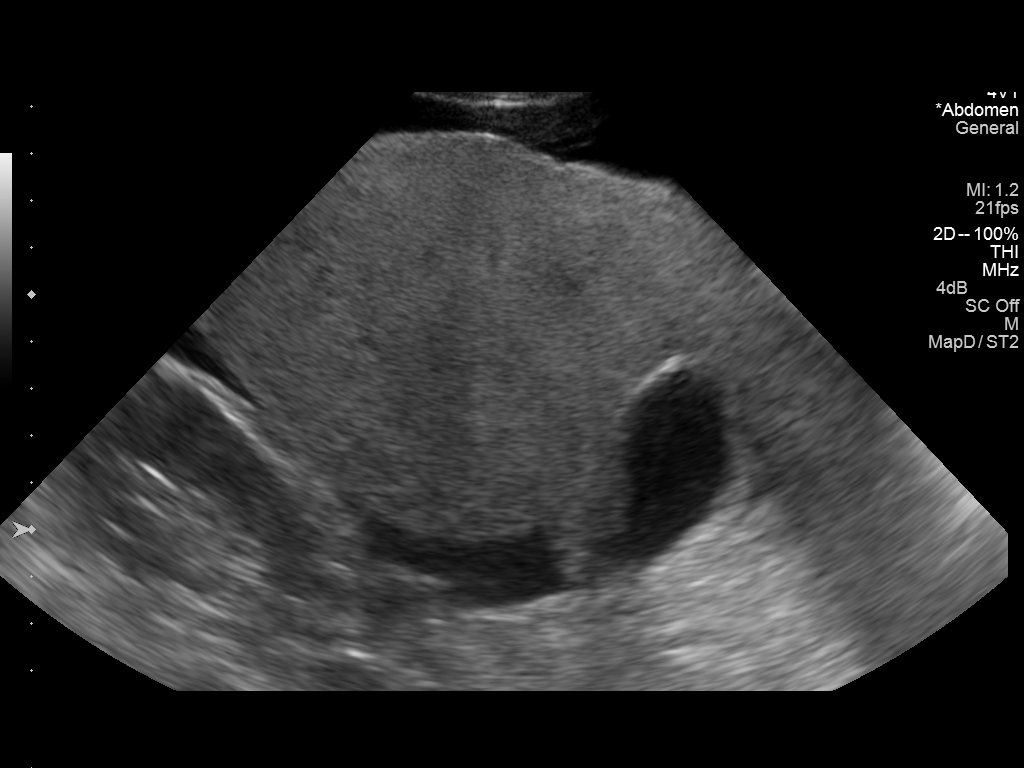
[im 3/6]
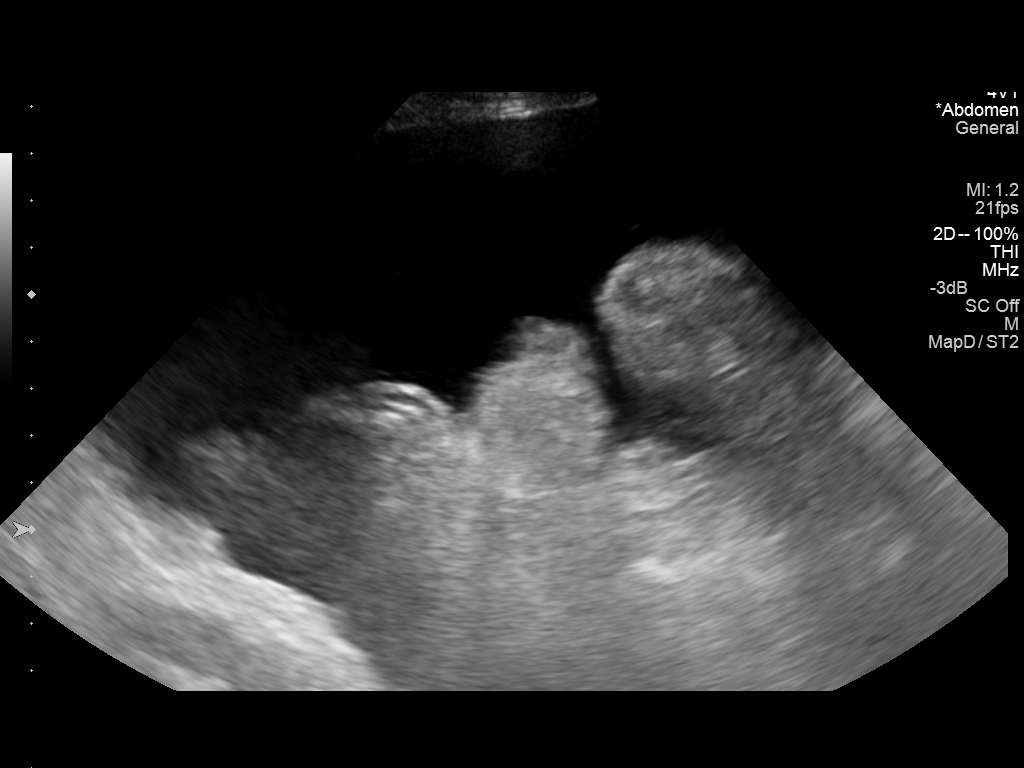
[im 4/6]
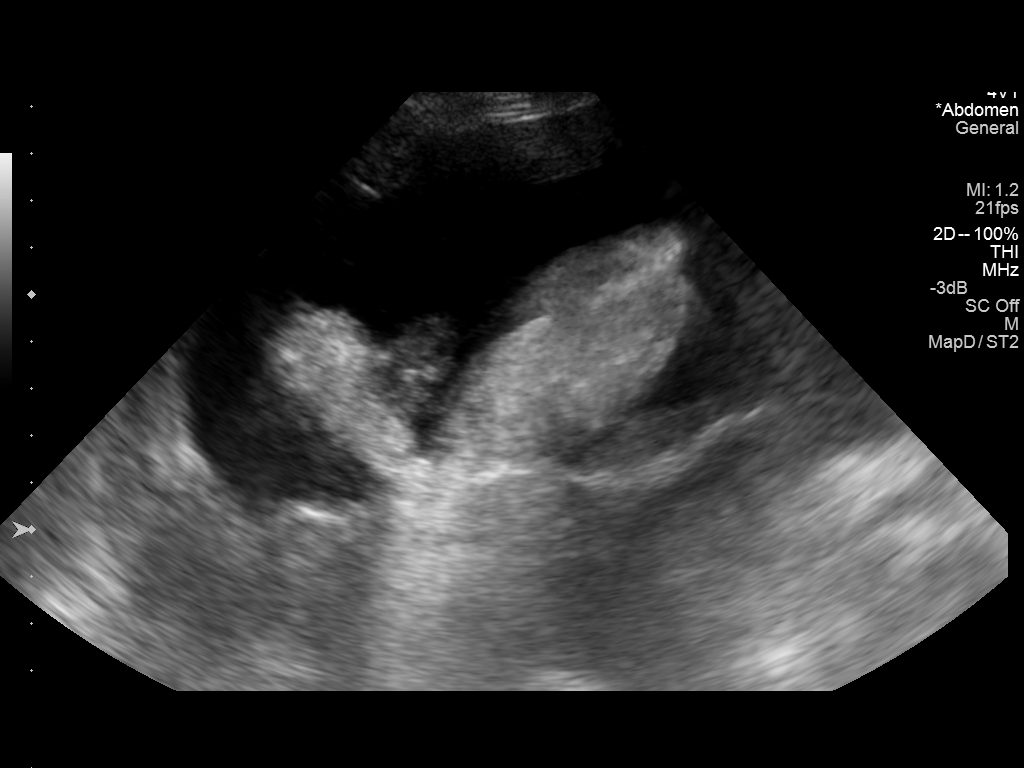
[im 5/6]
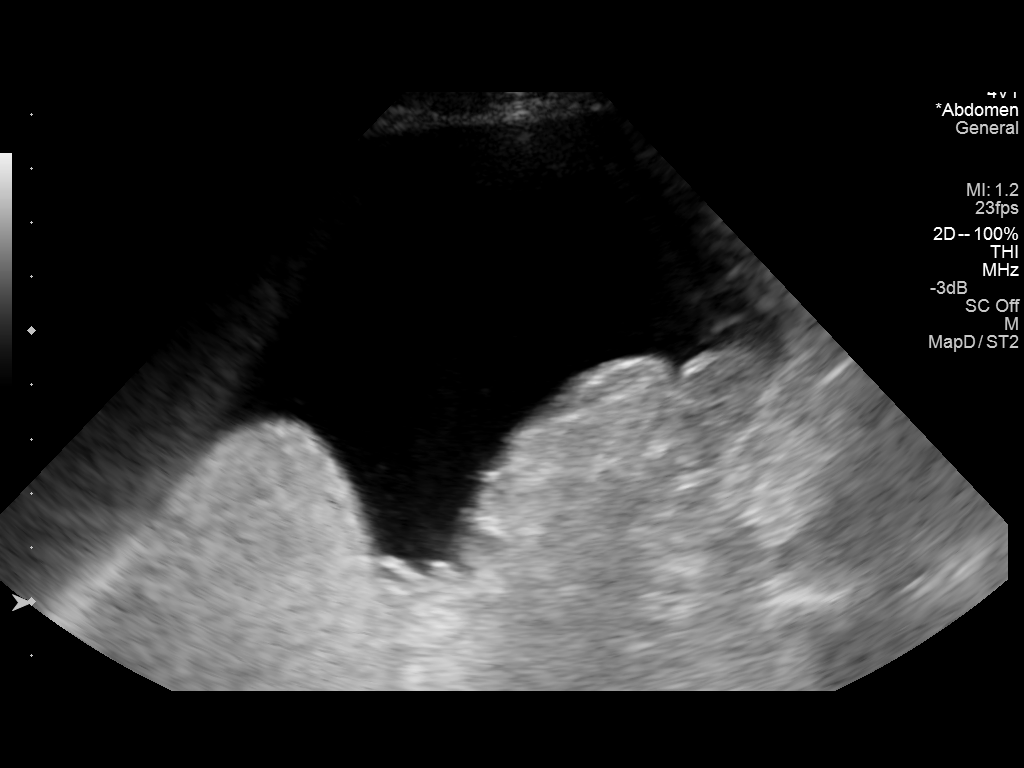
[im 6/6]
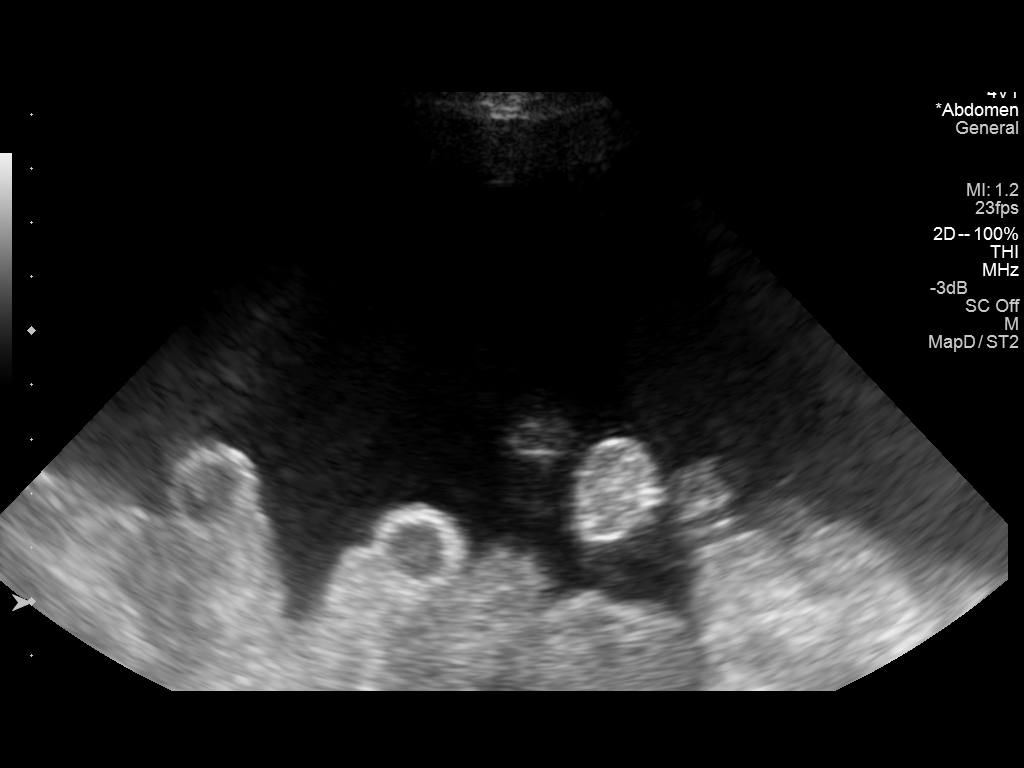

[6 of 6 positions shown; findings below may reference images not displayed]

PROCEDURE:
Procedure, benefits, and risks of procedure were discussed with
patient.

Written informed consent for procedure was obtained.

Time out protocol followed.

Adequate collection of ascites localized by ultrasound in RIGHT
lower quadrant.

Skin prepped and draped in usual sterile fashion.

Skin and soft tissues anesthetized with 10 mL of 1% lidocaine.

5 [place] Yueh catheter placed into peritoneal cavity.

7100 mL of yellow fluid aspirated by vacuum bottle suction.

Procedure tolerated well by patient without immediate complication.
FINDINGS: A total of approximately 7100 mL of yellow ascitic fluid was
removed. A fluid sample of 180 mL sent for laboratory analysis.
IMPRESSION: Successful ultrasound guided paracentesis yielding 7100 mL of
ascites.

## 2014-11-14 ENCOUNTER — Ambulatory Visit (INDEPENDENT_AMBULATORY_CARE_PROVIDER_SITE_OTHER): Payer: Medicare Other | Admitting: Gastroenterology

## 2014-11-14 ENCOUNTER — Other Ambulatory Visit: Payer: Self-pay

## 2014-11-14 ENCOUNTER — Encounter: Payer: Self-pay | Admitting: Gastroenterology

## 2014-11-14 VITALS — BP 128/82 | HR 112 | Temp 98.2°F | Ht 62.0 in | Wt 119.6 lb

## 2014-11-14 DIAGNOSIS — R188 Other ascites: Secondary | ICD-10-CM

## 2014-11-14 DIAGNOSIS — K7460 Unspecified cirrhosis of liver: Secondary | ICD-10-CM

## 2014-11-14 DIAGNOSIS — E440 Moderate protein-calorie malnutrition: Secondary | ICD-10-CM

## 2014-11-14 DIAGNOSIS — K7290 Hepatic failure, unspecified without coma: Secondary | ICD-10-CM | POA: Diagnosis not present

## 2014-11-14 DIAGNOSIS — I8500 Esophageal varices without bleeding: Secondary | ICD-10-CM

## 2014-11-14 DIAGNOSIS — K7682 Hepatic encephalopathy: Secondary | ICD-10-CM

## 2014-11-14 DIAGNOSIS — K7581 Nonalcoholic steatohepatitis (NASH): Secondary | ICD-10-CM

## 2014-11-14 MED ORDER — OXYCODONE HCL 10 MG PO TABS: ORAL_TABLET | ORAL | Status: DC

## 2014-11-14 MED ORDER — LORAZEPAM 1 MG PO TABS: ORAL_TABLET | ORAL | Status: DC

## 2014-11-14 NOTE — Progress Notes (Signed)
cc'ed to pcp °

## 2014-11-14 NOTE — Progress Notes (Signed)
ON RECALL LIST  °

## 2014-11-14 NOTE — Progress Notes (Signed)
Subjective:    Patient ID: Wanda [name], female    DOB: April 27, 1976, 40 y.o.   MRN: [id number]  [name]  HPI Evaluation at [facility]. PENDING MRI AND BONE DENSITY STUDIES. WAS TAKING A MVI BUT HARD TO SWALLOW-BIG HORSE PILL. NEEDS REFILL ON OXYCODONE. C/O ABD PAIN UNDER R BREAST ON HER RIB AND AROUND HER NAVEL(SHARP, ANYTIME SHE MOVES). LATELY BEEN HAVING BACK SPASM IN PAST MO. NOT EVERY DAY. JUST HITS ALL OF A SUDDEN ALL OF A SUDDEN. CONSTANT. BMs: SOFT. EATING OK. APPETITE: FAIR. PAIN IN R CHEST NEAR CATHETER. NOSE STAYS STOPPED UP. ON DIALYSIS: TUTHSAT. PT DENIES FEVER, CHILLS, HEMATOCHEZIA, HEMATEMESIS, nausea, vomiting, melena, diarrhea, CHEST PAIN, SHORTNESS OF BREATH,  CHANGE IN BOWEL IN HABITS, constipation, problems swallowing, OR heartburn or indigestion.   Past Medical History  Diagnosis Date  . GERD (gastroesophageal reflux disease)   . 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 c/w steatohepatitis  . Folate deficiency 09/2013  . Anasarca 10/10/2013  . Hematemesis/vomiting blood 02/24/2014  . Acute blood loss anemia 02/25/2014    Status post transfusion  . Macrocytosis 02/28/2014  . 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 Acute renal failure 09/2013    Pre-renal- resolved  . C. difficile colitis 04/19/2014  . Anxiety   . Depression   . Thrombocytopenia     Hypercellular bone marrow; abundant megakaryocytes per 08/27/2014; s/p bone marrow bx  . Cirrhosis of liver with ascites   . SBP (spontaneous bacterial peritonitis) 11/10/2013  . Bipolar disorder 12/04/2013    2007-SEEN IN ED FOR INVOLUNTARY COMMITMENT, UDS POS FOR AMPHETAMINES/OPIATES   . PNA (pneumonia) 10/13/2013  . Chronic hypotension   . Gastroesophageal junction ulcer 09/17/2014  . ESRD (end stage renal disease) on dialysis 08/2014   Past Surgical History  Procedure Laterality Date  . None    . Paracentesis  Feb 2015    1180 fluid, negative  fluid analysis.   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 Paracentesis  10/2013  . Colonoscopy N/A 12/19/2013    SLF:NO OBVIOUS SOURCE FOR ANEMIA IDETIFIED/ONE COLON POLYP REMOVED/Small internal hemorrhoids  . Esophagogastroduodenoscopy N/A 02/11/2014    Dr. Rourk:Esophageal varices with bleeding stigmata-status post esophageal band ligation therapy. Portal gastropathy  . Esophagogastroduodenoscopy N/A 07/04/2014    RMR: Persiting grade 2 esophageal varicies with bleeding stigmata status post band ligation. Significantly congested gastric mucosa iwith changes constistant with protal gastropathy.   . Esophageal banding  07/04/2014    Procedure: ESOPHAGEAL BANDING;  Surgeon: Daneil Dolin, MD;  Location: [facility] ENDO SUITE;  Service: Endoscopy;;  . Esophagogastroduodenoscopy (egd) with propofol N/A 07/24/2014    SLF:  1. 2 columns grade 2-3 varices- 2 Bands applied.  2.  Moderate gastropathy 3. Duodenal Diverticula  . Esophageal banding N/A 07/24/2014    Procedure: ESOPHAGEAL BANDING (2 bands applied);  Surgeon: Danie Binder, MD;  Location: [facility] ORS;  Service: Endoscopy;  Laterality: N/A;  . Central venous catheter insertion Right   . Esophagogastroduodenoscopy N/A 09/16/2014    Rehman: Single short column of varix proximal to GE junction not large enough to be banded. Two amall ulcers at the GEJ felt to be source of GI Bleeding but no active bleeding but no actibe bleeding noted. No therapy rendered. Portal gastropathy NO evidence of peptic ulcer diease or  gastric varices.   . Esophagogastroduodenoscopy (egd) with propofol N/A 10/26/2014    RMR: 2 columns of grade 2 esophageal varices without obvious bleeding stigmata status post band ligation to complet obliteration of remaining varices.     Allergies  Allergen Reactions  . Lasix [Furosemide]     "doesn't work"  .  Morphine And Related Hives and Itching    Current Outpatient Prescriptions  Medication Sig Dispense Refill  . folic acid (FOLVITE) 1 MG tablet Take 1 tablet (1 mg total) by mouth daily.    [name] lactulose (CHRONULAC) 10 GM/15ML solution Take 15 mLs (10 g total) by mouth 2 (two) times daily. PRN   . loperamide (IMODIUM) 2 MG capsule Take 1 capsule (2 mg total) by mouth 2 (two) times daily as needed for diarrhea or loose stools.    [name] LORazepam (ATIVAN) 1 MG tablet Take 1 tablet by mouth Q12H PRN For anxiety.    . metolazone (ZAROXOLYN) 5 MG tablet Take 2 tablets (10 mg total) by mouth daily.    . midodrine (PROAMATINE) 10 MG tablet Take 1 tablet (10 mg total) by mouth 2 (two) times daily with a meal. Medication to keep your blood pressure from falling.    . pantoprazole (PROTONIX) 40 MG tablet Take 1 tablet (40 mg total) by mouth 2 (two) times daily.    . promethazine (PHENERGAN) 12.5 MG tablet Take 1 tablet (12.5 mg total) by mouth every 6 (six) hours as needed for nausea or vomiting.    .      . lidocaine (XYLOCAINE) 2 % solution 2 TSP  PO QAC AND HS TO PREVENT FOR CHEST PAIN WHILE EATING (Patient not taking: Reported on 11/14/2014)    . Nutritional Supplements (FEEDING SUPPLEMENT, NEPRO CARB STEADY,) LIQD Take 237 mLs by mouth as needed (missed meal during dialysis.). (Patient not taking: Reported on 11/14/2014)    . Oxycodone HCl 10 MG TABS Take 1 tablet (10 mg total) by mouth Q6H PRN     Review of Systems     Objective:   Physical Exam  Constitutional: She is oriented to person, place, and time. She appears well-developed and well-nourished. No distress.  HENT:  Head: Normocephalic and atraumatic.  Mouth/Throat: Oropharynx is clear and moist. No oropharyngeal exudate.  Eyes: Pupils are equal, round, and reactive to light. No scleral icterus.  Neck: Normal range of motion. Neck supple.  Cardiovascular: Normal rate, regular rhythm and normal heart sounds.   Pulmonary/Chest: Effort normal and  breath sounds normal. No respiratory distress. She exhibits tenderness (OVER CATHETER).  Abdominal: Soft. Bowel sounds are normal. She exhibits distension. There is tenderness. There is no guarding.  UMBILICAL BULGE TENDER TO PALPATION BUT REDUCIBLE.  Musculoskeletal: She exhibits no edema.  Lymphadenopathy:    She has no cervical adenopathy.  Neurological: She is alert and oriented to person, place, and time.  NO  NEW FOCAL DEFICITS   Psychiatric:  FLAT AFFECT, NL MOOD   Vitals reviewed.         Assessment & Plan:

## 2014-11-14 NOTE — Assessment & Plan Note (Signed)
MENTAL STATUS BASELINE.  LACTULOSE PRN. CONTINUE TO MONITOR SYMPTOMS.

## 2014-11-14 NOTE — Assessment & Plan Note (Signed)
NO BRBPR OR MELENA.  EGD/?EVL MAY 2016 W/ MAC. DISCUSSED PROCEDURE, BENEFITS, & RISKS: < 1% chance of medication reaction, OR bleeding. FOLLOW UP IN 6 MOS.

## 2014-11-14 NOTE — Assessment & Plan Note (Signed)
ALBUMIN IMPROVED. WEIGHT STABLE.  CONTINUE TO MONITOR SYMPTOMS.

## 2014-11-14 NOTE — Patient Instructions (Signed)
COMPLETE PARACENTESIS. YOU MAY HAVE 1 OR 2 PER WEEK. THE ORDER EXPIRES May 16, 2015 AND THEN I WILL RENEW IT AGAIN.  PLEASE CALL WITH QUESTIONS OR CONCERNS.  CALL ME IN ONE WEEK IF YOU HAVEN'T HEARD FROM [facility] ABOUT SCHEDULING YOUR TESTS.  ATIVAN AND OXYCODONE AS NEEDED.  UPPER ENDOSCOPY IN MAY 2016.  FOLLOW UP IN 6 MOS. YOU NEED LABS AND AN ULTRASOUND 1 WEEK PRIOR TO YOUR VISIT.

## 2014-11-14 NOTE — Assessment & Plan Note (Signed)
PARTIALLYCCOMPENSATED DISEASE COMPLICATED BU VARICEAL BLEEDING AND ASCITES. ALBUMIN SIGNIFICANTLY IMPROVED.  LVP 1-2X/WEEK AS NEEDED HD: TUTHSAT CMP/PT/INR/AFP/IMAGING 1 WEEK PRIOR TO NEXT OPV. PT GIVEN RX FOR ATIVAN AND OXYCODONE FOR apr/may/jun. Call in [place] for refILLS. FOLLOW UP IN 6 MOS.

## 2014-11-15 ENCOUNTER — Encounter (HOSPITAL_COMMUNITY): Payer: Self-pay | Admitting: *Deleted

## 2014-11-15 ENCOUNTER — Telehealth: Payer: Self-pay

## 2014-11-15 MED ORDER — CHLORHEXIDINE GLUCONATE CLOTH 2 % EX PADS: 6.0000 | MEDICATED_PAD | Freq: Once | CUTANEOUS | Status: DC

## 2014-11-15 MED ORDER — DEXTROSE 5 % IV SOLN
1.5000 g | INTRAVENOUS | Status: AC
  Administered 2014-11-16: 1.5 g via INTRAVENOUS
  Filled 2014-11-15: qty 1.5

## 2014-11-15 MED ORDER — SODIUM CHLORIDE 0.9 % IV SOLN
INTRAVENOUS | Status: DC
  Administered 2014-11-16: 08:00:00 via INTRAVENOUS

## 2014-11-15 NOTE — Progress Notes (Signed)
Anesthesia Chart Review: SAME DAY WORK-UP.  Patient is a 39 year old female scheduled for RUE AVF versus AVGG tomorrow by Dr. Scot Dock.  History includes ESRD on HD TTS (R IJ tunneled catheter), non-alcoholic fatty liver disease (negative for hepatitis B/C) with cirrhosis with esophageal varices bleeding s/p last banding 10/26/14 X 2 and thrombocytopenia and ascites s/p paracentesis 10/31/14 4300 ML (gets LVP 1-2X/week PRN), former smoker, Bipolar disorder, GERD, hypotension, anxiety, depression, folate deficiency, anasarca. PCP is with [facility]. GI is Dr. Barney Drain, last visit 11/14/14. Mental status was baseline. Recently established with Dr. Opal Sidles with [facility] (see Care Everywhere), last visit 09/10/14. The process for liver transplantation is just beginning. Nephrology is Dr. Lowanda Foster. Last BMI recorded was 21.87.  Meds include Cipiro, Folic acid, lactulose PRN, Ativan, metolazone, midodrine, oxycodone, Protonix, promethazine.   09/15/14 EKG: ST at 122 bpm, PACs, consider RAE, borderline prolonged QT interval.   11/06/13 Echo: - Study data: Technically adequate study. - Left ventricle: The cavity size was normal. Wall thickness was normal. Systolic function was vigorous. The estimated ejection fraction was in the range of 65% to 70%. Wall motion was normal; there were no regional wall motion abnormalities. Findings consistent with left ventricular diastolic dysfunction, indeterminate grade. There is evidence of elevated LA pressure (E/e' 16). - Left atrium: The atrium was moderately dilated. - Right ventricle: The cavity size was mildly dilated. - Right atrium: The atrium was mildly dilated. - Pulmonary arteries: Systolic pressure was moderately increased. PA peak pressure: 36mm Hg (S). - Inferior vena cava: The vessel was normal in size; the respirophasic diameter changes were in the normal range (= 50%); findings are consistent with normal central  venous pressure. - Pericardium, extracardiac: There is a ascites surrounding the liver.  She is for labs on arrival. Currently last labs from 11/01/14 show a Cr 2.58, AST 38, ALT 13, H/H 10.9/35.0, PLT count 59K, glucose 97. Ammonia level was 60 on 09/18/14.   Discussed with anesthesiologist Dr. Deatra Canter.  Instead of a routine ISTAT, she will need CMET, CBC, PT/PTT preoperatively.  Further review of same day labs and evaluation of the patient by Dr. Scot Dock and her anesthesiologist on the day of surgery to determine if patient is okay to proceed as planned.  George [facility] Short Stay Center/Anesthesiology Phone [phone] 11/15/2014 3:10 PM

## 2014-11-15 NOTE — Progress Notes (Signed)
Pt denies SOB, chest pain, and being under the care of a cardiologist. Pt verbalized understanding of all pre-op instructions.

## 2014-11-15 NOTE — Telephone Encounter (Signed)
I called pt and told her we have the Colonoscopy report and CD for her to take with her to [facility]. It will be at front for pick up and she said her boyfriend will be by today and pick it up. She said she does not have appt yet, but is waiting on appt.

## 2014-11-15 NOTE — Telephone Encounter (Signed)
I called to let pt know not to come by.  This was for a different pt.  I could not leave a message.

## 2014-11-16 ENCOUNTER — Ambulatory Visit (HOSPITAL_COMMUNITY)
Admission: RE | Admit: 2014-11-16 | Discharge: 2014-11-16 | Disposition: A | Discharge status: Home / Self Care | Payer: Medicare Other | Source: Ambulatory Visit | Attending: Vascular Surgery | Admitting: Vascular Surgery

## 2014-11-16 ENCOUNTER — Encounter (HOSPITAL_COMMUNITY): Payer: Self-pay | Admitting: *Deleted

## 2014-11-16 ENCOUNTER — Other Ambulatory Visit: Payer: Self-pay | Admitting: *Deleted

## 2014-11-16 ENCOUNTER — Encounter (HOSPITAL_COMMUNITY)
Admission: RE | Disposition: A | Discharge status: Home / Self Care | Payer: Self-pay | Source: Ambulatory Visit | Attending: Vascular Surgery

## 2014-11-16 ENCOUNTER — Ambulatory Visit (HOSPITAL_COMMUNITY): Payer: Medicare Other | Admitting: Vascular Surgery

## 2014-11-16 DIAGNOSIS — D696 Thrombocytopenia, unspecified: Secondary | ICD-10-CM | POA: Diagnosis not present

## 2014-11-16 DIAGNOSIS — F329 Major depressive disorder, single episode, unspecified: Secondary | ICD-10-CM | POA: Insufficient documentation

## 2014-11-16 DIAGNOSIS — F419 Anxiety disorder, unspecified: Secondary | ICD-10-CM | POA: Insufficient documentation

## 2014-11-16 DIAGNOSIS — N185 Chronic kidney disease, stage 5: Secondary | ICD-10-CM | POA: Diagnosis not present

## 2014-11-16 DIAGNOSIS — Z4931 Encounter for adequacy testing for hemodialysis: Secondary | ICD-10-CM

## 2014-11-16 DIAGNOSIS — K3189 Other diseases of stomach and duodenum: Secondary | ICD-10-CM | POA: Insufficient documentation

## 2014-11-16 DIAGNOSIS — K766 Portal hypertension: Secondary | ICD-10-CM | POA: Insufficient documentation

## 2014-11-16 DIAGNOSIS — F1910 Other psychoactive substance abuse, uncomplicated: Secondary | ICD-10-CM | POA: Insufficient documentation

## 2014-11-16 DIAGNOSIS — K7581 Nonalcoholic steatohepatitis (NASH): Secondary | ICD-10-CM | POA: Insufficient documentation

## 2014-11-16 DIAGNOSIS — Z886 Allergy status to analgesic agent status: Secondary | ICD-10-CM | POA: Diagnosis not present

## 2014-11-16 DIAGNOSIS — N186 End stage renal disease: Secondary | ICD-10-CM | POA: Diagnosis not present

## 2014-11-16 DIAGNOSIS — K219 Gastro-esophageal reflux disease without esophagitis: Secondary | ICD-10-CM | POA: Insufficient documentation

## 2014-11-16 DIAGNOSIS — I120 Hypertensive chronic kidney disease with stage 5 chronic kidney disease or end stage renal disease: Secondary | ICD-10-CM | POA: Diagnosis not present

## 2014-11-16 DIAGNOSIS — Z87891 Personal history of nicotine dependence: Secondary | ICD-10-CM | POA: Diagnosis not present

## 2014-11-16 DIAGNOSIS — Z992 Dependence on renal dialysis: Secondary | ICD-10-CM | POA: Insufficient documentation

## 2014-11-16 DIAGNOSIS — F319 Bipolar disorder, unspecified: Secondary | ICD-10-CM | POA: Insufficient documentation

## 2014-11-16 DIAGNOSIS — Z888 Allergy status to other drugs, medicaments and biological substances status: Secondary | ICD-10-CM | POA: Diagnosis not present

## 2014-11-16 HISTORY — PX: AV FISTULA PLACEMENT: SHX1204

## 2014-11-16 LAB — CBC
HCT: 36.3 % (ref 36.0–46.0)
HEMATOCRIT: 42 % (ref 36.0–46.0)
Hemoglobin: 13.7 g/dL (ref 12.0–15.0)
Hemoglobin: 12.3 g/dL (ref 12.0–15.0)
MCH: 32.1 pg (ref 26.0–34.0)
MCH: 32.8 pg (ref 26.0–34.0)
MCHC: 32.6 g/dL (ref 30.0–36.0)
MCHC: 33.9 g/dL (ref 30.0–36.0)
MCV: 96.8 fL (ref 78.0–100.0)
MCV: 98.4 fL (ref 78.0–100.0)
Platelets: 151 10*3/uL (ref 150–400)
Platelets: 110 10*3/uL — ABNORMAL LOW (ref 150–400)
RBC: 4.27 MIL/uL (ref 3.87–5.11)
RBC: 3.75 MIL/uL — ABNORMAL LOW (ref 3.87–5.11)
RDW: 17.4 % — AB (ref 11.5–15.5)
RDW: 17.8 % — ABNORMAL HIGH (ref 11.5–15.5)
WBC: 8.3 10*3/uL (ref 4.0–10.5)
WBC: 8.1 10*3/uL (ref 4.0–10.5)

## 2014-11-16 LAB — COMPREHENSIVE METABOLIC PANEL
ALK PHOS: 88 U/L (ref 39–117)
ALT: 17 U/L (ref 0–35)
AST: 54 U/L — AB (ref 0–37)
Albumin: 3.4 g/dL — ABNORMAL LOW (ref 3.5–5.2)
Anion gap: 10 (ref 5–15)
BILIRUBIN TOTAL: 1.8 mg/dL — AB (ref 0.3–1.2)
BUN: 16 mg/dL (ref 6–23)
CALCIUM: 9.2 mg/dL (ref 8.4–10.5)
CO2: 27 mmol/L (ref 19–32)
Chloride: 100 mmol/L (ref 96–112)
Creatinine, Ser: 3.18 mg/dL — ABNORMAL HIGH (ref 0.50–1.10)
GFR calc Af Amer: 20 mL/min — ABNORMAL LOW (ref >90)
GFR, EST NON AFRICAN AMERICAN: 17 mL/min — AB (ref >90)
Glucose, Bld: 104 mg/dL — ABNORMAL HIGH (ref 70–99)
Potassium: 3.1 mmol/L — ABNORMAL LOW (ref 3.5–5.1)
Sodium: 137 mmol/L (ref 135–145)
Total Protein: 6.9 g/dL (ref 6.0–8.3)

## 2014-11-16 LAB — POCT I-STAT 4, (NA,K, GLUC, HGB,HCT)
Glucose, Bld: 98 mg/dL (ref 70–99)
HCT: 39 % (ref 36.0–46.0)
Hemoglobin: 13.3 g/dL (ref 12.0–15.0)
Potassium: 3.4 mmol/L — ABNORMAL LOW (ref 3.5–5.1)
Sodium: 138 mmol/L (ref 135–145)

## 2014-11-16 LAB — PROTIME-INR
INR: 1.17 (ref 0.00–1.49)
Prothrombin Time: 15.1 seconds (ref 11.6–15.2)

## 2014-11-16 LAB — APTT: APTT: 33 s (ref 24–37)

## 2014-11-16 SURGERY — ARTERIOVENOUS (AV) FISTULA CREATION
Anesthesia: Monitor Anesthesia Care | Site: Arm Upper | Laterality: Right

## 2014-11-16 MED ORDER — 0.9 % SODIUM CHLORIDE (POUR BTL) OPTIME
TOPICAL | Status: DC | PRN
  Administered 2014-11-16: 1000 mL

## 2014-11-16 MED ORDER — MIDAZOLAM HCL 2 MG/2ML IJ SOLN: 0.5000 mg | Freq: Once | INTRAMUSCULAR | Status: DC | PRN

## 2014-11-16 MED ORDER — LIDOCAINE HCL (CARDIAC) 20 MG/ML IV SOLN
INTRAVENOUS | Status: AC
  Filled 2014-11-16: qty 5

## 2014-11-16 MED ORDER — PROTAMINE SULFATE 10 MG/ML IV SOLN
INTRAVENOUS | Status: AC
  Filled 2014-11-16: qty 15

## 2014-11-16 MED ORDER — FENTANYL CITRATE 0.05 MG/ML IJ SOLN
INTRAMUSCULAR | Status: AC
  Filled 2014-11-16: qty 2

## 2014-11-16 MED ORDER — FENTANYL CITRATE 0.05 MG/ML IJ SOLN
25.0000 ug | INTRAMUSCULAR | Status: DC | PRN
  Administered 2014-11-16 (×3): 50 ug via INTRAVENOUS

## 2014-11-16 MED ORDER — SODIUM CHLORIDE 0.9 % IV SOLN
INTRAVENOUS | Status: DC | PRN
  Administered 2014-11-16: 10:00:00 via INTRAVENOUS

## 2014-11-16 MED ORDER — FENTANYL CITRATE 0.05 MG/ML IJ SOLN
INTRAMUSCULAR | Status: AC
  Administered 2014-11-16: 50 ug via INTRAVENOUS
  Filled 2014-11-16: qty 2

## 2014-11-16 MED ORDER — LIDOCAINE HCL (PF) 1 % IJ SOLN
INTRAMUSCULAR | Status: DC | PRN
  Administered 2014-11-16: 10 mL

## 2014-11-16 MED ORDER — FENTANYL CITRATE 0.05 MG/ML IJ SOLN
INTRAMUSCULAR | Status: AC
  Filled 2014-11-16: qty 5

## 2014-11-16 MED ORDER — PROMETHAZINE HCL 25 MG/ML IJ SOLN: 6.2500 mg | INTRAMUSCULAR | Status: DC | PRN

## 2014-11-16 MED ORDER — ONDANSETRON HCL 4 MG/2ML IJ SOLN
INTRAMUSCULAR | Status: DC | PRN
  Administered 2014-11-16: 4 mg via INTRAVENOUS

## 2014-11-16 MED ORDER — SODIUM CHLORIDE 0.9 % IR SOLN
Status: DC | PRN
  Administered 2014-11-16: 500 mL

## 2014-11-16 MED ORDER — LIDOCAINE HCL (PF) 1 % IJ SOLN
INTRAMUSCULAR | Status: AC
  Filled 2014-11-16: qty 30

## 2014-11-16 MED ORDER — PROTAMINE SULFATE 10 MG/ML IV SOLN
INTRAVENOUS | Status: DC | PRN
  Administered 2014-11-16: 20 mg via INTRAVENOUS

## 2014-11-16 MED ORDER — HEPARIN SODIUM (PORCINE) 1000 UNIT/ML IJ SOLN
INTRAMUSCULAR | Status: DC | PRN
  Administered 2014-11-16: 4 mL via INTRAVENOUS

## 2014-11-16 MED ORDER — ONDANSETRON HCL 4 MG/2ML IJ SOLN
INTRAMUSCULAR | Status: AC
  Filled 2014-11-16: qty 2

## 2014-11-16 MED ORDER — PROPOFOL INFUSION 10 MG/ML OPTIME
INTRAVENOUS | Status: DC | PRN
  Administered 2014-11-16: 50 ug/kg/min via INTRAVENOUS

## 2014-11-16 MED ORDER — PHENYLEPHRINE HCL 10 MG/ML IJ SOLN
INTRAMUSCULAR | Status: DC | PRN
  Administered 2014-11-16 (×2): 40 ug via INTRAVENOUS

## 2014-11-16 MED ORDER — THROMBIN 20000 UNITS EX SOLR
CUTANEOUS | Status: AC
  Filled 2014-11-16: qty 20000

## 2014-11-16 MED ORDER — MIDAZOLAM HCL 2 MG/2ML IJ SOLN
INTRAMUSCULAR | Status: AC
  Filled 2014-11-16: qty 2

## 2014-11-16 MED ORDER — MEPERIDINE HCL 25 MG/ML IJ SOLN: 6.2500 mg | INTRAMUSCULAR | Status: DC | PRN

## 2014-11-16 MED ORDER — FENTANYL CITRATE 0.05 MG/ML IJ SOLN
INTRAMUSCULAR | Status: DC | PRN
  Administered 2014-11-16 (×4): 25 ug via INTRAVENOUS

## 2014-11-16 MED ORDER — MIDAZOLAM HCL 5 MG/5ML IJ SOLN
INTRAMUSCULAR | Status: DC | PRN
  Administered 2014-11-16: 2 mg via INTRAVENOUS

## 2014-11-16 MED ORDER — OXYCODONE-ACETAMINOPHEN 5-325 MG PO TABS: 1.0000 | ORAL_TABLET | ORAL | Status: DC | PRN

## 2014-11-16 SURGICAL SUPPLY — 33 items
ARMBAND PINK RESTRICT EXTREMIT (MISCELLANEOUS) ×3 IMPLANT
CANISTER SUCTION 2500CC (MISCELLANEOUS) ×3 IMPLANT
CANNULA VESSEL 3MM 2 BLNT TIP (CANNULA) ×3 IMPLANT
CLIP TI MEDIUM 6 (CLIP) ×3 IMPLANT
CLIP TI WIDE RED SMALL 6 (CLIP) ×3 IMPLANT
COVER PROBE W GEL 5X96 (DRAPES) IMPLANT
DECANTER SPIKE VIAL GLASS SM (MISCELLANEOUS) ×3 IMPLANT
ELECT REM PT RETURN 9FT ADLT (ELECTROSURGICAL) ×3
ELECTRODE REM PT RTRN 9FT ADLT (ELECTROSURGICAL) ×1 IMPLANT
GLOVE BIO SURGEON STRL SZ 6.5 (GLOVE) ×2 IMPLANT
GLOVE BIO SURGEON STRL SZ7.5 (GLOVE) ×3 IMPLANT
GLOVE BIO SURGEONS STRL SZ 6.5 (GLOVE) ×2
GLOVE BIOGEL PI IND STRL 6.5 (GLOVE) IMPLANT
GLOVE BIOGEL PI IND STRL 8 (GLOVE) ×1 IMPLANT
GLOVE BIOGEL PI INDICATOR 6.5 (GLOVE) ×6
GLOVE BIOGEL PI INDICATOR 8 (GLOVE) ×2
GLOVE SKINSENSE NS SZ7.0 (GLOVE) ×2
GLOVE SKINSENSE STRL SZ7.0 (GLOVE) IMPLANT
GOWN STRL REUS W/ TWL LRG LVL3 (GOWN DISPOSABLE) ×3 IMPLANT
GOWN STRL REUS W/TWL LRG LVL3 (GOWN DISPOSABLE) ×9
KIT BASIN OR (CUSTOM PROCEDURE TRAY) ×3 IMPLANT
KIT ROOM TURNOVER OR (KITS) ×3 IMPLANT
LIQUID BAND (GAUZE/BANDAGES/DRESSINGS) ×3 IMPLANT
NS IRRIG 1000ML POUR BTL (IV SOLUTION) ×3 IMPLANT
PACK CV ACCESS (CUSTOM PROCEDURE TRAY) ×3 IMPLANT
PAD ARMBOARD 7.5X6 YLW CONV (MISCELLANEOUS) ×6 IMPLANT
SPONGE SURGIFOAM ABS GEL 100 (HEMOSTASIS) IMPLANT
SUT PROLENE 6 0 BV (SUTURE) ×3 IMPLANT
SUT VIC AB 3-0 SH 27 (SUTURE) ×3
SUT VIC AB 3-0 SH 27X BRD (SUTURE) ×1 IMPLANT
SUT VICRYL 4-0 PS2 18IN ABS (SUTURE) ×3 IMPLANT
UNDERPAD 30X30 INCONTINENT (UNDERPADS AND DIAPERS) ×3 IMPLANT
WATER STERILE IRR 1000ML POUR (IV SOLUTION) ×3 IMPLANT

## 2014-11-16 NOTE — H&P (View-Only) (Signed)
[facility]  Patient name: Wanda [name] MRN: [id number] DOB: Aug 09, 1976 Sex: female  REASON FOR CONSULT: Evaluate for new hemodialysis access. Referred by Dr. Lowanda Foster.  HPI: Wanda [name] is a 39 y.o. female who is not yet on dialysis. She was referred for hemodialysis access. She dialyzes on Tuesdays Thursdays and Saturdays. She has a functioning right IJ tunneled dialysis catheter that was placed proximally 2 months ago. She is not sure of the etiology of her chronic renal insufficiency. She denies any recent uremic symptoms. She denies anorexia, fatigue, or palpitations. She does have some problems with nausea and vomiting which have been chronic. She also requires intermittent paracentesis.  She is right handed.  Past Medical History  Diagnosis Date  . GERD (gastroesophageal reflux disease)   . 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 c/w steatohepatitis  . Folate deficiency 09/2013  . Anasarca 10/10/2013  . Hematemesis/vomiting blood 02/24/2014  . Acute blood loss anemia 02/25/2014    Status post transfusion  . Macrocytosis 02/28/2014  . 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 Acute renal failure 09/2013    Pre-renal- resolved  . C. difficile colitis 04/19/2014  . Anxiety   . Depression   . Thrombocytopenia     Hypercellular bone marrow; abundant megakaryocytes per 08/27/2014; s/p bone marrow bx  . Cirrhosis of liver with ascites   . SBP (spontaneous bacterial peritonitis) 11/10/2013  . Bipolar disorder 12/04/2013    2007-SEEN IN ED FOR INVOLUNTARY COMMITMENT, UDS POS FOR AMPHETAMINES/OPIATES   . PNA (pneumonia) 10/13/2013  . Chronic hypotension   . Gastroesophageal junction ulcer 09/17/2014  . ESRD (end stage renal disease) on dialysis 08/2014   Family History  Problem Relation Age of Onset  . Heart disease Mother   . Colon cancer Neg Hx   . Liver disease Neg Hx    SOCIAL  HISTORY: History  Substance Use Topics  . Smoking status: Former Smoker -- 0.25 packs/day for 20 years    Types: Cigarettes    Quit date: 11/05/2008  . Smokeless tobacco: Former Systems developer  . Alcohol Use: No   Allergies  Allergen Reactions  . Lasix [Furosemide]     "doesn't work"  . Morphine And Related Hives and Itching   Current Outpatient Prescriptions  Medication Sig Dispense Refill  . folic acid (FOLVITE) 1 MG tablet Take 1 tablet (1 mg total) by mouth daily. 30 tablet 5  . lactulose (CHRONULAC) 10 GM/15ML solution Take 15 mLs (10 g total) by mouth 2 (two) times daily. 240 mL 0  . lidocaine (XYLOCAINE) 2 % solution 2 TSP  PO QAC AND HS TO PREVENT FOR CHEST PAIN WHILE EATING 500 mL 1  . loperamide (IMODIUM) 2 MG capsule Take 1 capsule (2 mg total) by mouth 2 (two) times daily as needed for diarrhea or loose stools. 30 capsule 0  . LORazepam (ATIVAN) 1 MG tablet Take 1 tablet (1 mg total) by mouth every 12 (twelve) hours as needed for anxiety. 15 tablet 0  . magnesium oxide (MAG-OX) 400 (241.3 MG) MG tablet Take 1 tablet (400 mg total) by mouth daily. 30 tablet 3  . metolazone (ZAROXOLYN) 5 MG tablet Take 2 tablets (10 mg total) by mouth daily. 60 tablet 3  . midodrine (PROAMATINE) 10 MG tablet Take 1 tablet (10 mg total) by mouth 2 (two) times daily with a meal. Medication to keep your blood pressure from  falling. 60 tablet 3  . Nutritional Supplements (FEEDING SUPPLEMENT, NEPRO CARB STEADY,) LIQD Take 237 mLs by mouth as needed (missed meal during dialysis.).  0  . pantoprazole (PROTONIX) 40 MG tablet Take 1 tablet (40 mg total) by mouth 2 (two) times daily. 60 tablet 2  . promethazine (PHENERGAN) 12.5 MG tablet Take 1 tablet (12.5 mg total) by mouth every 6 (six) hours as needed for nausea or vomiting. 30 tablet 1  . Oxycodone HCl 10 MG TABS Take 1 tablet (10 mg total) by mouth every 6 (six) hours as needed. (Patient not taking: Reported on 10/24/2014) 25 tablet 0   No current  facility-administered medications for this visit.   REVIEW OF SYSTEMS: Valu.Nieves ] denotes positive finding; [  ] denotes negative finding  CARDIOVASCULAR:  [ ]  chest pain   [ ]  chest pressure   [ ]  palpitations   [ ]  orthopnea   [ ]  dyspnea on exertion   [ ]  claudication   [ ]  rest pain   [ ]  DVT   [ ]  phlebitis PULMONARY:   [ ]  productive cough   [ ]  asthma   [ ]  wheezing NEUROLOGIC:   [ ]  weakness  [ ]  paresthesias  [ ]  aphasia  [ ]  amaurosis  [ ]  dizziness HEMATOLOGIC:   [ ]  bleeding problems   [ ]  clotting disorders MUSCULOSKELETAL:  [ ]  joint pain   [ ]  joint swelling Valu.Nieves ] leg swelling GASTROINTESTINAL: Valu.Nieves ]  blood in stool  [ ]   hematemesis GENITOURINARY:  [ ]   dysuria  [ ]   hematuria PSYCHIATRIC:  [ ]  history of major depression INTEGUMENTARY:  [ ]  rashes  [ ]  ulcers CONSTITUTIONAL:  [ ]  fever   [ ]  chills  PHYSICAL EXAM: Filed Vitals:   10/24/14 1041  BP: 121/63  Pulse: 118  Height: 5\' 2"  (1.575 m)  Weight: 150 lb (68.04 kg)  SpO2: 100%   Body mass index is 27.43 kg/(m^2). GENERAL: The patient is a well-nourished female, in no acute distress. The vital signs are documented above. CARDIOVASCULAR: There is a regular rate and rhythm. I do not detect carotid bruits. She has palpable brachial and radial pulses bilaterally. PULMONARY: There is good air exchange bilaterally without wheezing or rales. ABDOMEN: Soft and non-tender with normal pitched bowel sounds. Her abdomen is distended. MUSCULOSKELETAL: There are no major deformities or cyanosis. NEUROLOGIC: No focal weakness or paresthesias are detected. SKIN: There are no ulcers or rashes noted. PSYCHIATRIC: The patient has a normal affect.  DATA:  Her GFR was 21 on 09/17/2014.  I have independently interpreted her vein mapping. She does not appear to have adequate vein in the left arm for fistula. It looks like her best option for a fistula would be a right brachiocephalic fistula.  I have also independently interpreted her  arterial Doppler study which shows triphasic Doppler signals in the radial and ulnar positions bilaterally.  MEDICAL ISSUES:  STAGE V CHRONIC KIDNEY DISEASE: This patient presents for new access. I think her best option for a fistula would be a right brachiocephalic AV fistula. I have explained the indications for placement of an AV fistula or AV graft. I've explained that if at all possible we will place an AV fistula.  I have reviewed the risks of placement of an AV fistula including but not limited to: failure of the fistula to mature, need for subsequent interventions, and thrombosis. In addition I have reviewed the potential complications of placement of  an AV graft. These risks include, but are not limited to, graft thrombosis, graft infection, wound healing problems, bleeding, arm swelling, and steal syndrome. All the patient's questions were answered and they are agreeable to proceed with surgery. Her surgery is scheduled for 11/16/2014.  [place] [facility] [place]: [phone]

## 2014-11-16 NOTE — Progress Notes (Signed)
Pt reported to this nurse that she does not have menstrual cycles, per anesthesia order sets, serum pregnancy test not drawn.

## 2014-11-16 NOTE — Interval H&P Note (Signed)
History and Physical Interval Note:  11/16/2014 9:35 AM  Wanda [name]  has presented today for surgery, with the diagnosis of End Stage Renal Disease N18.6  The various methods of treatment have been discussed with the patient and family. After consideration of risks, benefits and other options for treatment, the patient has consented to  Procedure(s): ARTERIOVENOUS (AV) FISTULA CREATION VERSUS GRAFT INSERTION (Right) as a surgical intervention .  The patient's history has been reviewed, patient examined, no change in status, stable for surgery.  I have reviewed the patient's chart and labs.  Questions were answered to the patient's satisfaction.     DICKSON,CHRISTOPHER S

## 2014-11-16 NOTE — Op Note (Signed)
    NAME: [name]   MRN: [id number] DOB: Sep 01, 1975    DATE OF OPERATION: 11/16/2014  PREOP DIAGNOSIS: end-stage renal disease  POSTOP DIAGNOSIS: same  PROCEDURE: right brachiocephalic AV fistula  SURGEON: Judeth Cornfield. Scot Dock, MD, FACS  ASSIST: Delena Serve  ANESTHESIA: local with sedation   EBL: minimal  INDICATIONS: Wanda [name] is a 39 y.o. female who presents for new access.  FINDINGS: 3.5 mm right upper arm cephalic vein  TECHNIQUE: The patient was taken to the operating room and sedated by anesthesia. The right upper extremity was prepped and draped in usual sterile fashion. After the skin was anesthetized with 1% lidocaine, a transverse incision was made just above the antecubital level. Here the cephalic vein was dissected free and ligated distally. It irrigated up nicely with heparinized saline and was a 3.5 mm vein. The brachial artery was dissected free beneath the fascia. The patient was heparinized. The brachial artery was clamped proximally and distally and a longitudinal arteriotomy was made. The vein was sewn end-to-side to the artery using continuous 6-0 Prolene suture. At the completion was an excellent thrill in the fistula and a palpable radial pulse. The heparin was partially reversed with protamine. The wound was closed with a deep layer of 3-0 Vicryl and the skin closed with 4-0 Vicryl. Dermabond was applied. The patient tolerated the procedure well and was transferred to the recovery room in stable condition. All needle and sponge counts were correct.  Deitra Mayo, MD, FACS [facility]  DATE OF DICTATION:   11/16/2014

## 2014-11-16 NOTE — Anesthesia Preprocedure Evaluation (Addendum)
Anesthesia Evaluation  Patient identified by MRN, date of birth, ID band Patient awake    Reviewed: Allergy & Precautions, NPO status , Patient's Chart, lab work & pertinent test results  History of Anesthesia Complications Negative for: history of anesthetic complications  Airway Mallampati: II  TM Distance: >3 FB Neck ROM: Full    Dental  (+) Dental Advisory Given   Pulmonary former smoker (quit 2010),  Recent intubation at [facility] during [place] banding breath sounds clear to auscultation        Cardiovascular negative cardio ROS  Rhythm:Regular Rate:Normal  11/07/14 ECHO: EF 65-70%, valves OK   Neuro/Psych Anxiety Depression Bipolar Disorder negative neurological ROS     GI/Hepatic GERD-  Medicated and Controlled,(+) Cirrhosis -  Esophageal Varices and ascites  substance abuse  alcohol use, Hepatitis - (steatohepatitis)  Endo/Other  negative endocrine ROS  Renal/GU ESRF and DialysisRenal disease     Musculoskeletal   Abdominal   Peds  Hematology  (+) Blood dyscrasia (thrombocytopenia, plt 151), ,   Anesthesia Other Findings   Reproductive/Obstetrics LMP over a year ago Denies sexual relations, declines preg test                           Anesthesia Physical Anesthesia Plan  ASA: III  Anesthesia Plan: MAC   Post-op Pain Management:    Induction:   Airway Management Planned: Simple Face Mask  Additional Equipment:   Intra-op Plan:   Post-operative Plan:   Informed Consent: I have reviewed the patients History and Physical, chart, labs and discussed the procedure including the risks, benefits and alternatives for the proposed anesthesia with the patient or authorized representative who has indicated his/her understanding and acceptance.   Dental advisory given  Plan Discussed with: CRNA and Surgeon  Anesthesia Plan Comments: (Plan routine monitors, MAC)         Anesthesia Quick Evaluation

## 2014-11-16 NOTE — Anesthesia Postprocedure Evaluation (Signed)
  Anesthesia Post-op Note  Patient: Wanda [name]  Procedure(s) Performed: Procedure(s): Right arm Creation of arteriovenous fistula (Right)  Patient Location: PACU  Anesthesia Type:MAC  Level of Consciousness: awake, alert , oriented and patient cooperative  Airway and Oxygen Therapy: Patient Spontanous Breathing  Post-op Pain: mild  Post-op Assessment: Post-op Vital signs reviewed, Patient's Cardiovascular Status Stable, Respiratory Function Stable, Patent Airway, No signs of Nausea or vomiting and Pain level controlled  Post-op Vital Signs: Reviewed and stable  Last Vitals:  Filed Vitals:   11/16/14 1129  BP:   Pulse: 81  Temp:   Resp: 19    Complications: No apparent anesthesia complications

## 2014-11-16 NOTE — Transfer of Care (Signed)
Immediate Anesthesia Transfer of Care Note  Patient: Wanda [name]  Procedure(s) Performed: Procedure(s): Right arm Creation of arteriovenous fistula (Right)  Patient Location: PACU  Anesthesia Type:MAC  Level of Consciousness: awake and alert   Airway & Oxygen Therapy: Patient Spontanous Breathing and Patient connected to face mask oxygen  Post-op Assessment: Report given to RN and Post -op Vital signs reviewed and stable  Post vital signs: Reviewed and stable  Last Vitals:  Filed Vitals:   11/16/14 1127  BP:   Pulse:   Temp: 36.7 C  Resp:     Complications: No apparent anesthesia complications

## 2014-11-19 ENCOUNTER — Encounter (HOSPITAL_COMMUNITY): Payer: Self-pay | Admitting: Vascular Surgery

## 2014-11-19 ENCOUNTER — Telehealth: Payer: Self-pay | Admitting: Vascular Surgery

## 2014-11-19 IMAGING — CR DG ABDOMEN ACUTE W/ 1V CHEST
3 series · 3 of 3 positions shown · non-contrast
Comparison: Chest 02/25/2014.  Abdomen series [id number].

CLINICAL DATA: Dehydration and weakness for a week. Fluid in the
abdomen.

EXAM:
ACUTE ABDOMEN SERIES (ABDOMEN 2 VIEW & CHEST 1 VIEW)

[view not recorded (1 of 3)]
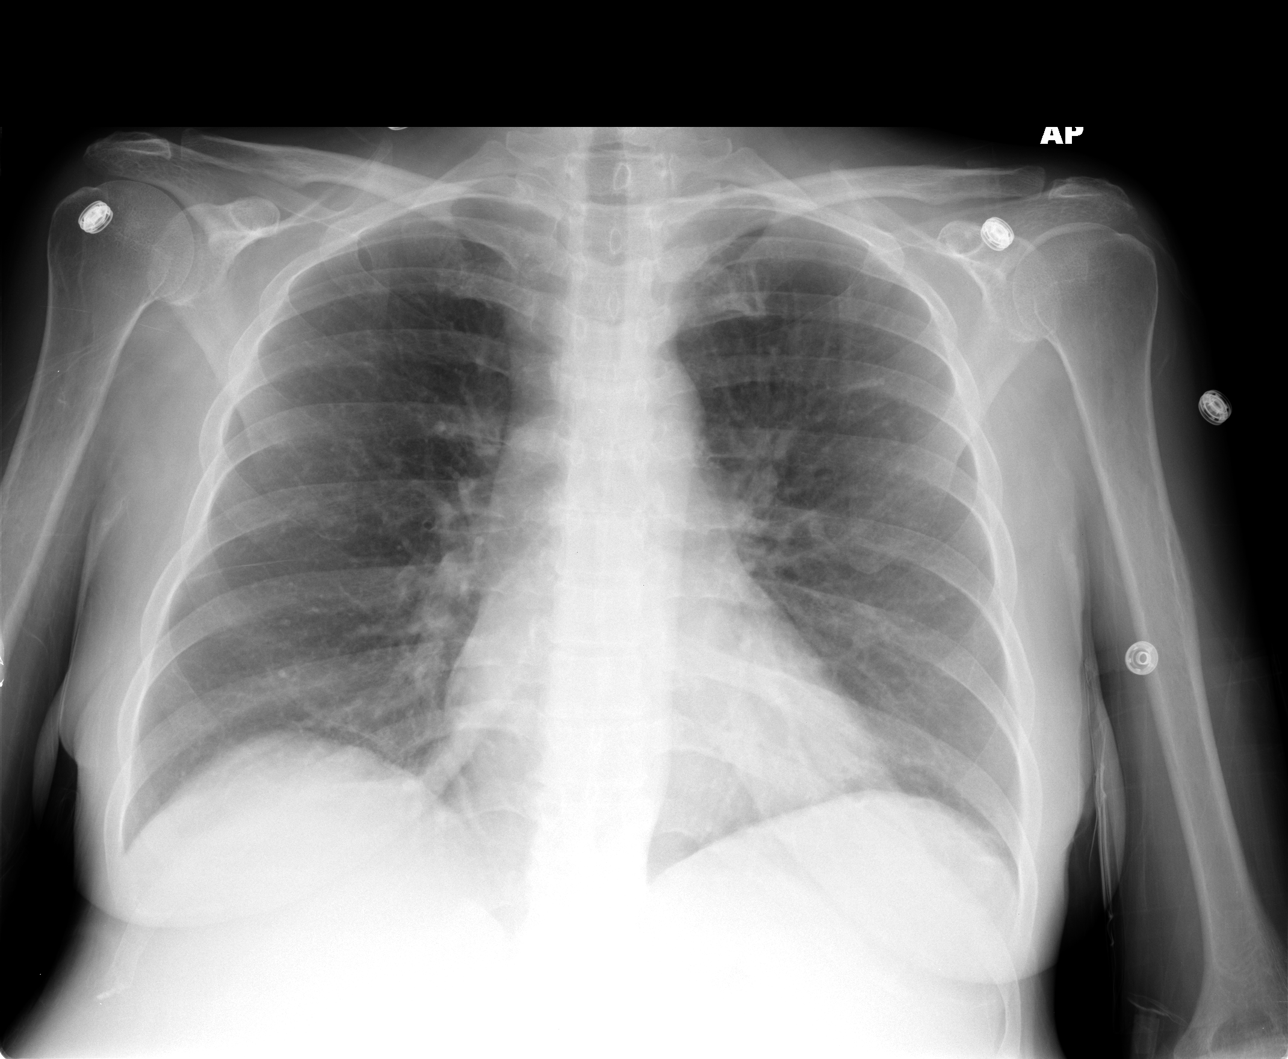

[view not recorded (2 of 3)]
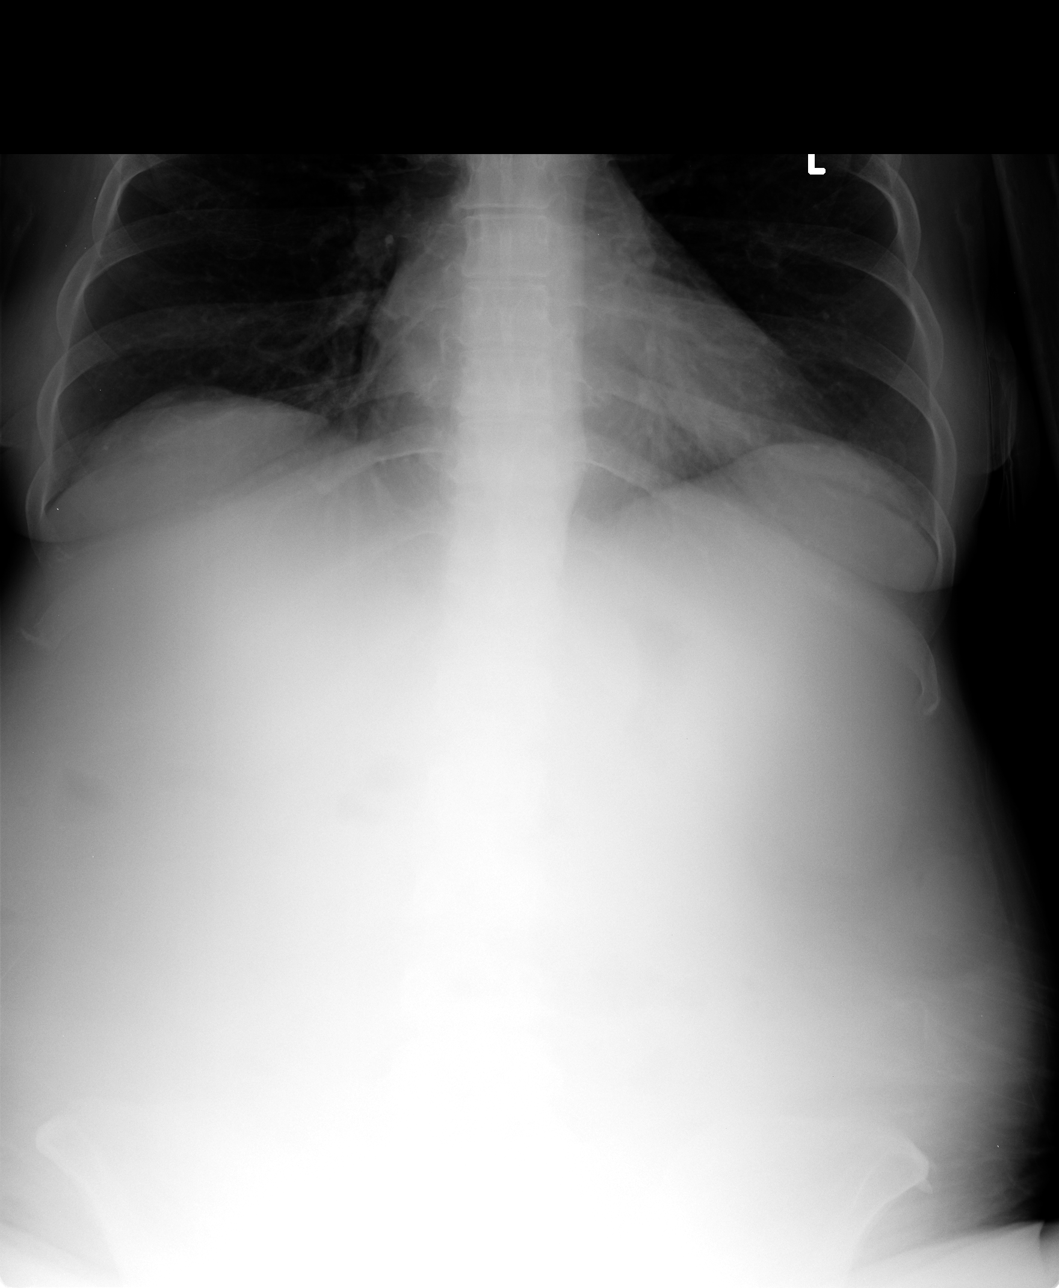

[view not recorded (3 of 3)]
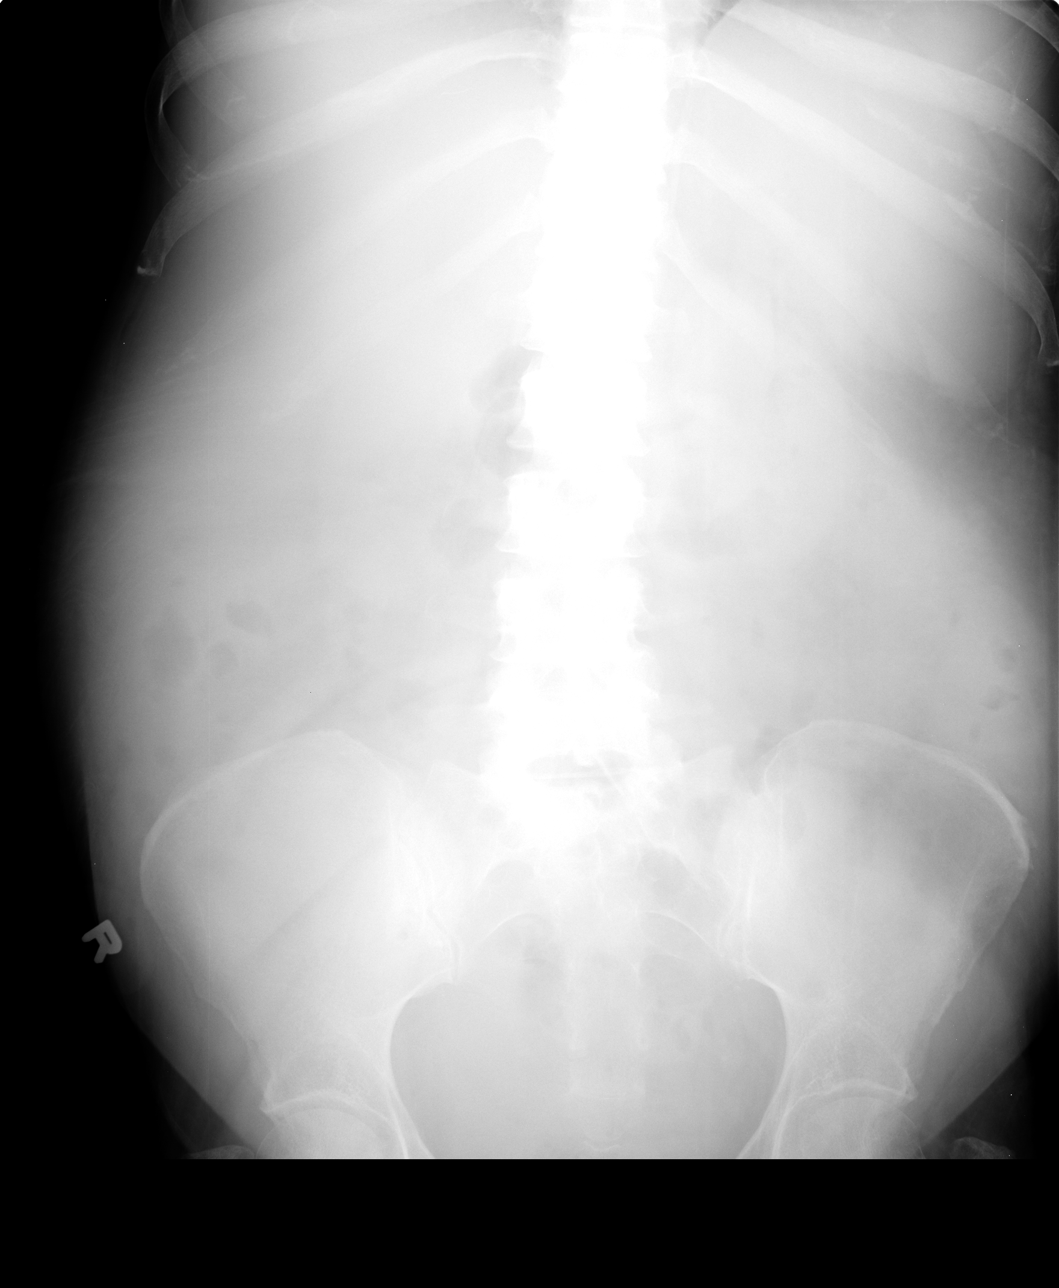

[3 of 3 positions shown; findings below may reference images not displayed]

FINDINGS: Shallow inspiration. Atelectasis in the lung bases. Normal heart
size and pulmonary vascularity. No focal airspace disease or
consolidation in the lungs. No blunting of costophrenic angles. No
pneumothorax. Mediastinal contours appear intact.

Abdominal distention with diffuse underpenetration consistent with
ascites. Scattered gas and stool in the colon. No small or large
bowel distention. No free intra-abdominal air. No abnormal air-fluid
levels noted.
IMPRESSION: Shallow inspiration with atelectasis in the lung bases.
Nonobstructive bowel gas pattern. Abdominal distention with diffuse
underpenetration suggesting ascites.

## 2014-11-19 NOTE — Telephone Encounter (Addendum)
NO answer, NO voicemail option, will try again later, dpm  11/21/14: no answer again, mailed letter, dpm

## 2014-11-19 NOTE — Telephone Encounter (Signed)
-----   Message from Mena Goes, RN sent at 11/16/2014 11:51 AM EDT ----- Regarding: Schedule   ----- Message -----    From: Angelia Mould, MD    Sent: 11/16/2014  11:42 AM      To: Vvs Charge Pool Subject: charge and f/u                                 PROCEDURE: right brachiocephalic AV fistula  SURGEON: Judeth Cornfield. Scot Dock, MD, FACS  ASSIST: Delena Serve  She will need a follow up visit in 6 weeks with a duplex to check on the maturation of her right brachiocephalic AV fistula. Thank you. CD

## 2014-11-23 IMAGING — US US PARACENTESIS
1 series · 1 of 1 positions shown · non-contrast
Comparison: April 13, 2014.

CLINICAL DATA: Ascites.

EXAM:
ULTRASOUND GUIDED PARACENTESIS

[Series 1: us paracentesis · 0.26mm/px · 1 of 1 slices shown]
[im 1/1]
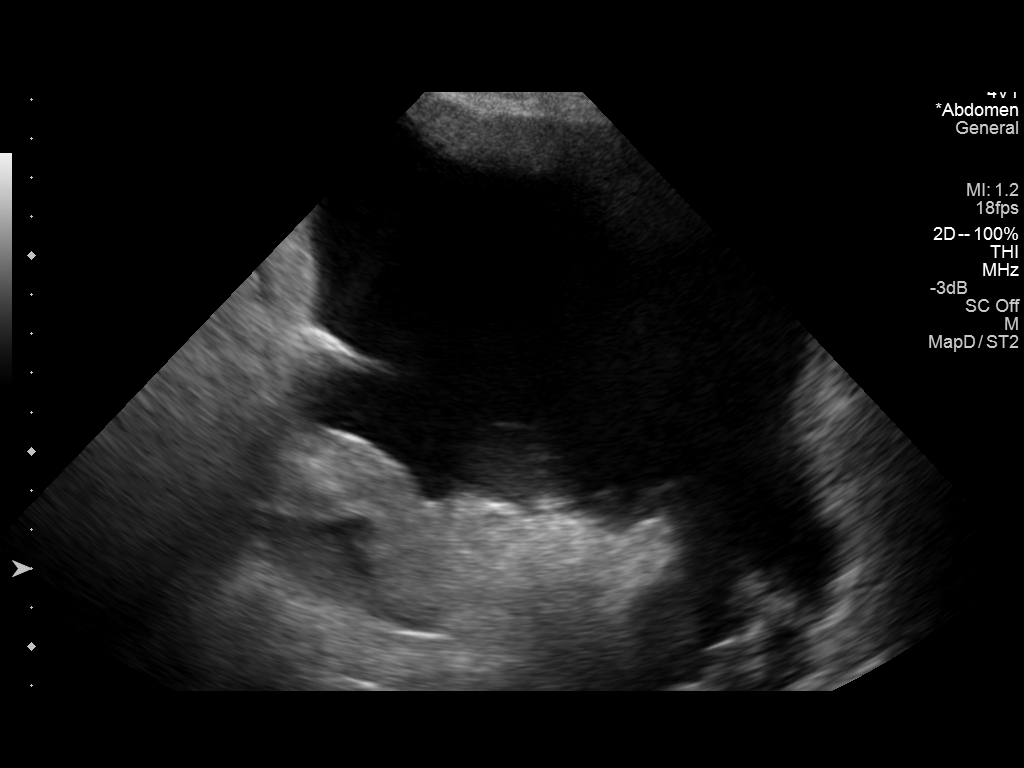

[1 of 1 positions shown; findings below may reference images not displayed]

PROCEDURE:
An ultrasound guided paracentesis was thoroughly discussed with the
patient and questions answered. The benefits, risks, alternatives
and complications were also discussed. The patient understands and
wishes to proceed with the procedure. Written consent was obtained.

Ultrasound was performed to localize and mark an adequate pocket of
fluid in the right lower quadrant of the abdomen. The area was then
prepped and draped in the normal sterile fashion. 1% Lidocaine was
used for local anesthesia. Under ultrasound guidance a paracentesis
catheter was introduced. Paracentesis was performed. The catheter
was removed and a dressing applied.

COMPLICATIONS:
None immediate.
FINDINGS: A total of approximately 0044 mL of serous fluid was removed. A
fluid sample was sent for laboratory analysis.
IMPRESSION: Successful ultrasound guided paracentesis yielding 0044 mL of
ascites.

## 2014-11-25 IMAGING — CR DG CHEST 1V PORT
1 series · 2 of 2 positions shown · non-contrast
Comparison: 04/19/2014

CLINICAL DATA: Edema, possible pneumonia

EXAM:
PORTABLE CHEST - 1 VIEW

[Series 1: portable · 0.17mm/px · 2 of 2 slices shown]
[im 1/2]
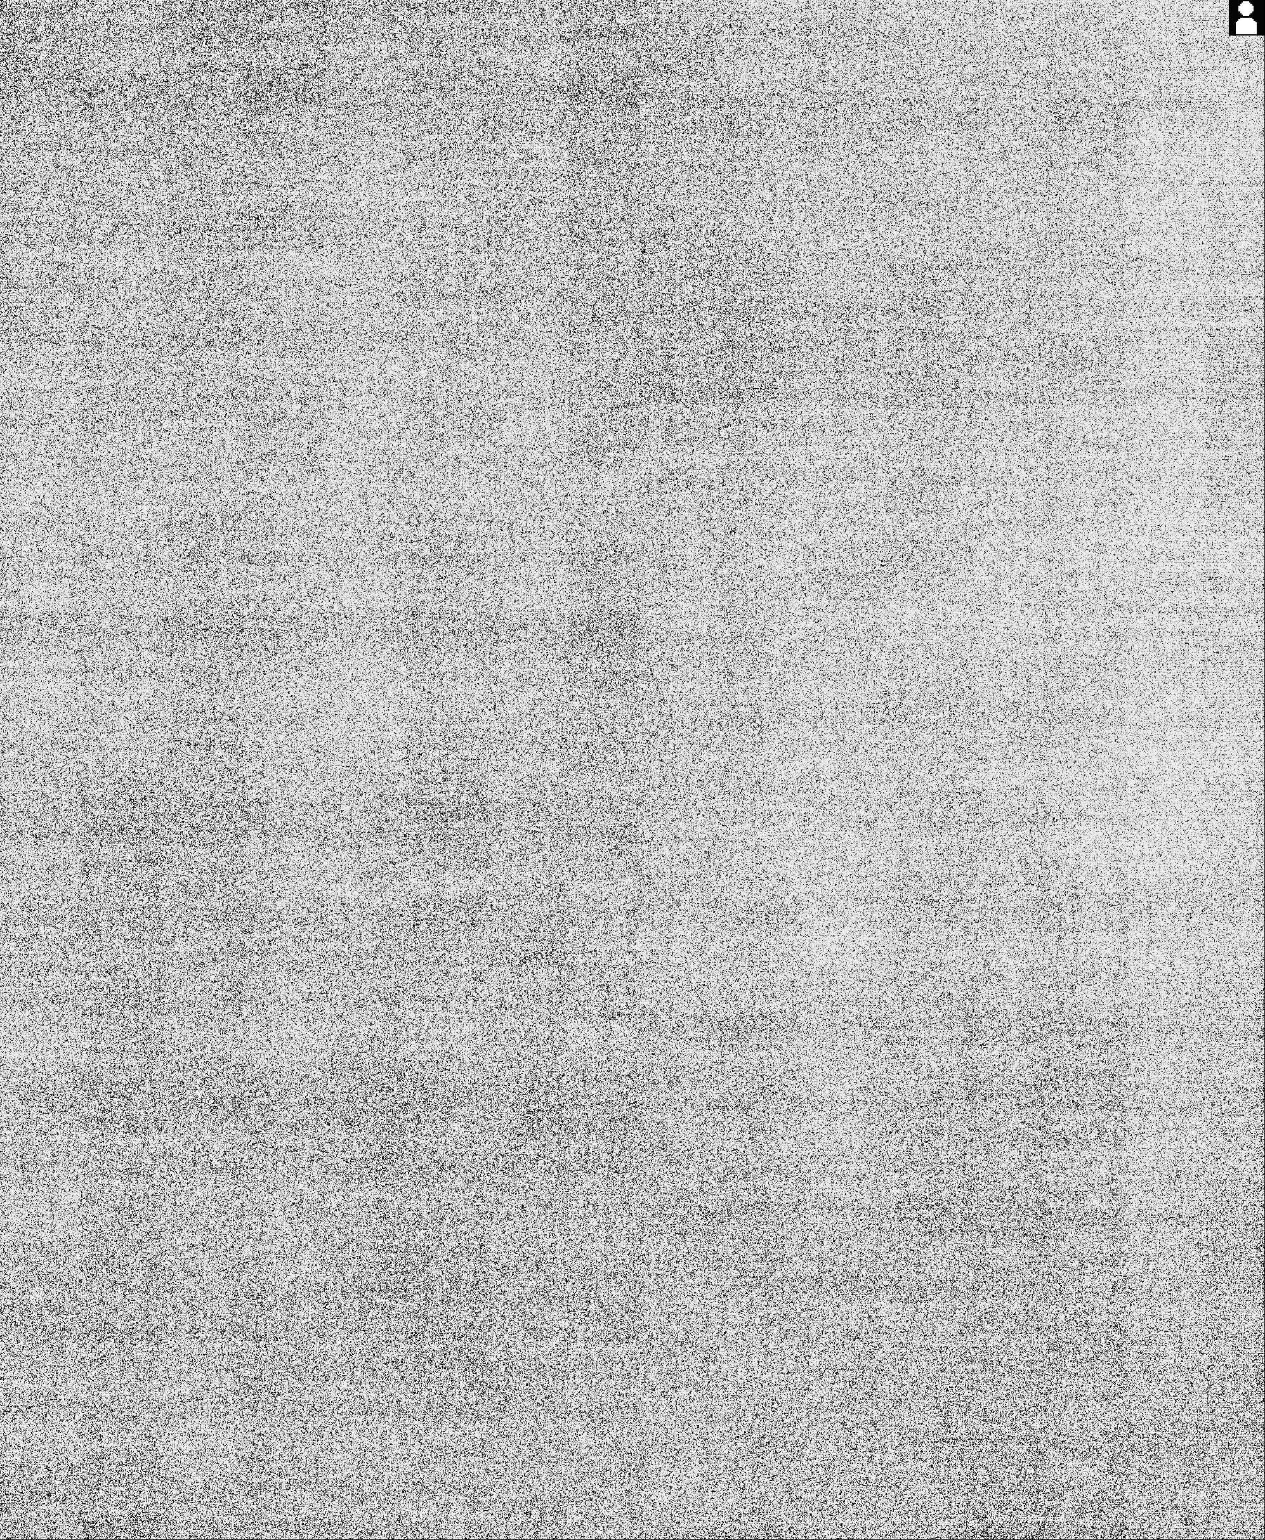
[im 2/2]
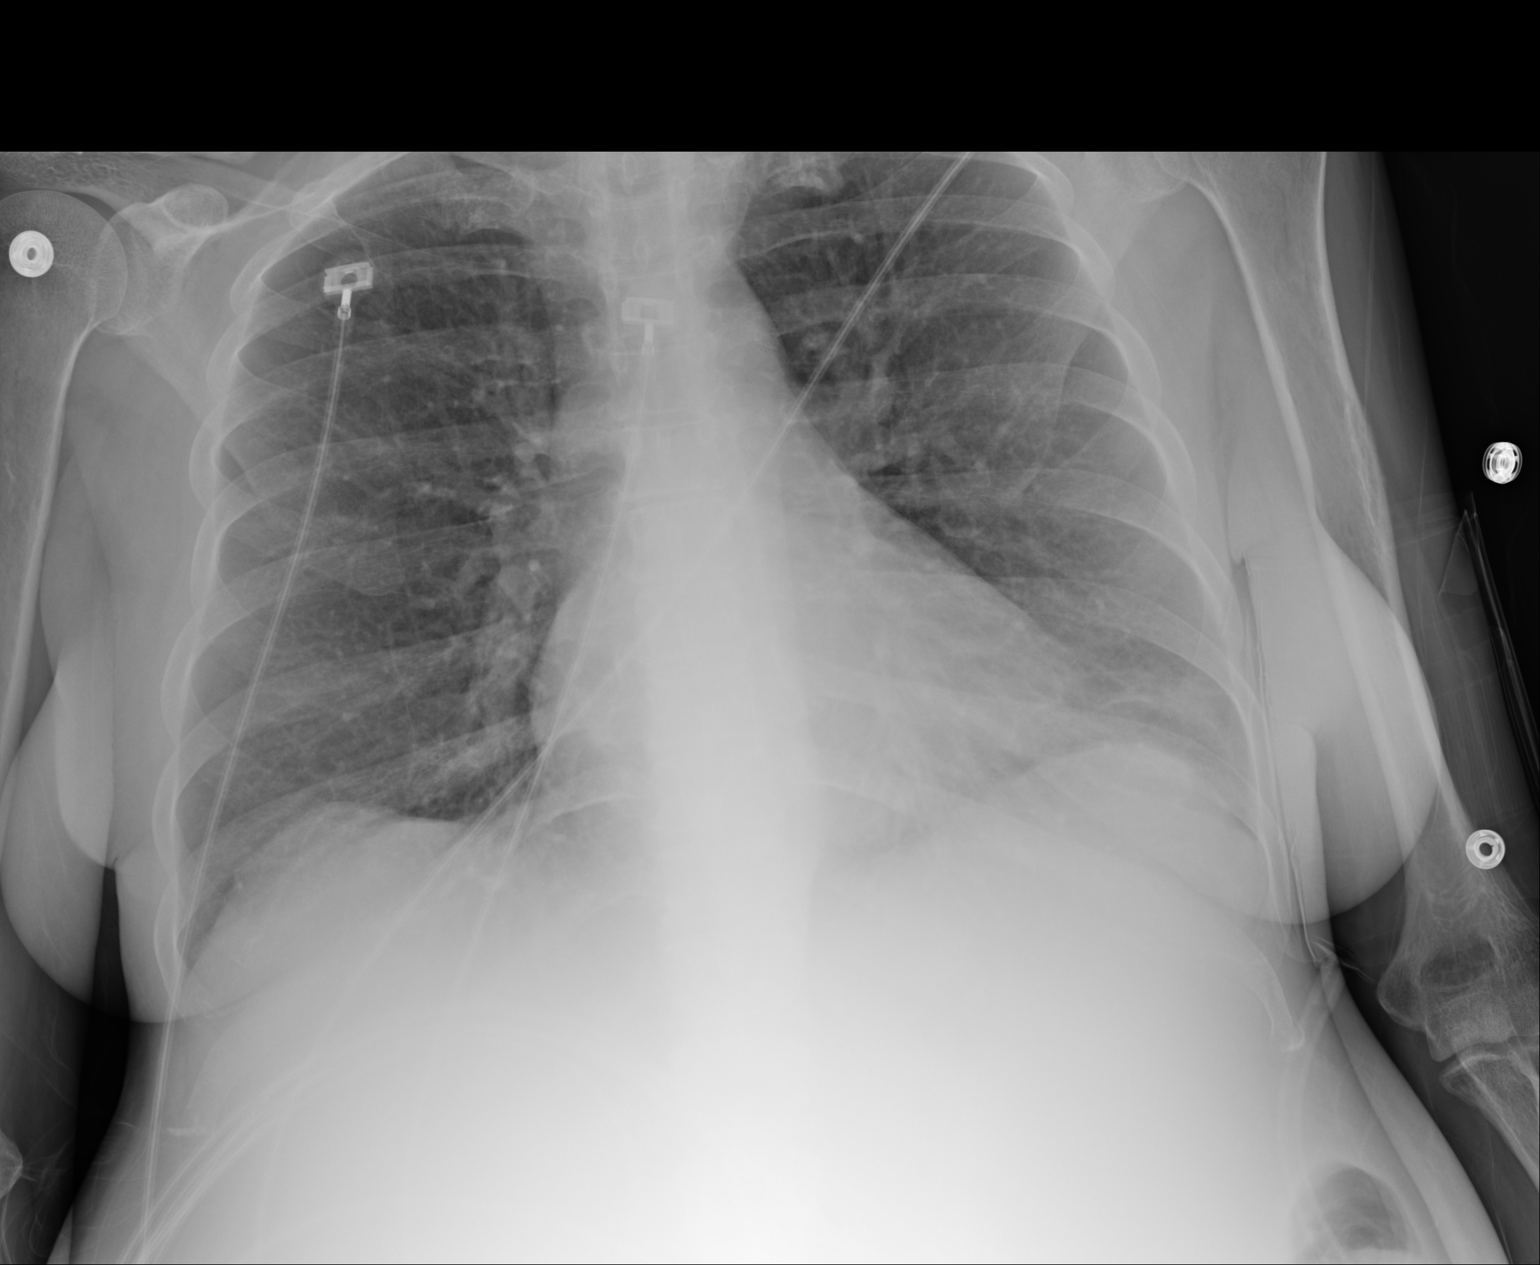

[2 of 2 positions shown; findings below may reference images not displayed]

FINDINGS: Cardiomediastinal silhouette is stable. No acute infiltrate or
pleural effusion. No pulmonary edema. Bony thorax is unremarkable.
IMPRESSION: No active disease.

## 2014-11-30 ENCOUNTER — Encounter (HOSPITAL_COMMUNITY): Payer: Self-pay

## 2014-11-30 ENCOUNTER — Ambulatory Visit (HOSPITAL_COMMUNITY)
Admission: RE | Admit: 2014-11-30 | Discharge: 2014-11-30 | Disposition: A | Discharge status: Home / Self Care | Payer: Medicare Other | Source: Ambulatory Visit | Attending: Gastroenterology | Admitting: Gastroenterology

## 2014-11-30 DIAGNOSIS — K7460 Unspecified cirrhosis of liver: Secondary | ICD-10-CM | POA: Insufficient documentation

## 2014-11-30 DIAGNOSIS — R188 Other ascites: Secondary | ICD-10-CM | POA: Diagnosis not present

## 2014-11-30 MED ORDER — ALBUMIN HUMAN 25 % IV SOLN
25.0000 g | Freq: Once | INTRAVENOUS | Status: AC
  Administered 2014-11-30: 25 g via INTRAVENOUS

## 2014-11-30 MED ORDER — ALBUMIN HUMAN 25 % IV SOLN
INTRAVENOUS | Status: AC
  Administered 2014-11-30: 25 g via INTRAVENOUS
  Filled 2014-11-30: qty 100

## 2014-11-30 NOTE — Procedures (Signed)
PreOperative Dx: Cirrhosis, ascites Postoperative Dx: Cirrhosis, ascites Procedure:   US guided paracentesis Radiologist:  Thornton Papas Anesthesia:  10 ml of 1% lidocaine Specimen:  6500 ml of amber colored ascitic fluid EBL:   < 1 ml Complications: None

## 2014-11-30 NOTE — Progress Notes (Signed)
Paracentesis complete no signs of distress. 6500 ml amber colored ascites removed.

## 2014-12-04 ENCOUNTER — Telehealth: Payer: Self-pay

## 2014-12-04 ENCOUNTER — Emergency Department (HOSPITAL_COMMUNITY)
Admission: EM | Admit: 2014-12-04 | Discharge: 2014-12-04 | Discharge status: Against Medical Advice | Payer: Medicare Other | Attending: Emergency Medicine | Admitting: Emergency Medicine

## 2014-12-04 ENCOUNTER — Encounter (HOSPITAL_COMMUNITY): Payer: Self-pay | Admitting: Emergency Medicine

## 2014-12-04 DIAGNOSIS — K921 Melena: Secondary | ICD-10-CM | POA: Insufficient documentation

## 2014-12-04 DIAGNOSIS — Z992 Dependence on renal dialysis: Secondary | ICD-10-CM | POA: Insufficient documentation

## 2014-12-04 DIAGNOSIS — N186 End stage renal disease: Secondary | ICD-10-CM | POA: Diagnosis not present

## 2014-12-04 LAB — BASIC METABOLIC PANEL
Anion gap: 10 (ref 5–15)
BUN: 36 mg/dL — AB (ref 6–23)
CO2: 26 mmol/L (ref 19–32)
Calcium: 8.6 mg/dL (ref 8.4–10.5)
Chloride: 98 mmol/L (ref 96–112)
Creatinine, Ser: 1.6 mg/dL — ABNORMAL HIGH (ref 0.50–1.10)
GFR calc Af Amer: 46 mL/min — ABNORMAL LOW (ref >90)
GFR calc non Af Amer: 39 mL/min — ABNORMAL LOW (ref >90)
Glucose, Bld: 92 mg/dL (ref 70–99)
Potassium: 3.1 mmol/L — ABNORMAL LOW (ref 3.5–5.1)
Sodium: 134 mmol/L — ABNORMAL LOW (ref 135–145)

## 2014-12-04 LAB — CBC WITH DIFFERENTIAL/PLATELET
Basophils Absolute: 0.1 10*3/uL (ref 0.0–0.1)
Basophils Relative: 1 % (ref 0–1)
EOS PCT: 1 % (ref 0–5)
Eosinophils Absolute: 0.1 10*3/uL (ref 0.0–0.7)
HEMATOCRIT: 32.1 % — AB (ref 36.0–46.0)
HEMOGLOBIN: 10.5 g/dL — AB (ref 12.0–15.0)
LYMPHS ABS: 2.2 10*3/uL (ref 0.7–4.0)
LYMPHS PCT: 25 % (ref 12–46)
MCH: 31.3 pg (ref 26.0–34.0)
MCHC: 32.7 g/dL (ref 30.0–36.0)
MCV: 95.8 fL (ref 78.0–100.0)
MONOS PCT: 5 % (ref 3–12)
Monocytes Absolute: 0.4 10*3/uL (ref 0.1–1.0)
NEUTROS PCT: 68 % (ref 43–77)
Neutro Abs: 5.9 10*3/uL (ref 1.7–7.7)
Platelets: 144 10*3/uL — ABNORMAL LOW (ref 150–400)
RBC: 3.35 MIL/uL — AB (ref 3.87–5.11)
RDW: 16.1 % — AB (ref 11.5–15.5)
WBC: 8.7 10*3/uL (ref 4.0–10.5)

## 2014-12-04 NOTE — Telephone Encounter (Signed)
Spoke with [name] and he is aware. States that pt is the middle of her dialysis and will make things happen when she is finished.

## 2014-12-04 NOTE — Telephone Encounter (Signed)
If she has had true black stool I would advise her to go to ER for evaluation.   If patient resists, then would recommend STAT CBC BUT has to go to ER if another black stool.  She needs to increase her lactulose to keep 2-3 soft BMs daily. Currently looks like she is on 1tablespoon BID, can increase to TID.

## 2014-12-04 NOTE — ED Notes (Signed)
No answer when called to patient room

## 2014-12-04 NOTE — Telephone Encounter (Signed)
pts friend [name]) called and states that patients had a black stool this morning.  States that pt had to strain a little. Patient denies abd pain at the moment.    No changes in medication.   Pt has dialysis today.    Please advise  Call back number-  [phone]

## 2014-12-04 NOTE — ED Notes (Signed)
Patient complaining of black, tarry stools since this morning. States she called her PCP and was told to come to ED.

## 2014-12-04 NOTE — Telephone Encounter (Signed)
FYI Dr. Gala Romney.

## 2014-12-04 NOTE — Telephone Encounter (Signed)
Noted  

## 2014-12-05 NOTE — Telephone Encounter (Signed)
noted 

## 2014-12-05 NOTE — Telephone Encounter (Signed)
I called pt. She said she is doing OK. She had one other black stool late yesterday afternoon.  She doesn't know why she left the ED.  I asked how long did she stay and she said 3 hours. She said she is going back today.  She is aware that hemoglobin has dropped significantly and she needs to go. She said she will sometime today.

## 2014-12-05 NOTE — Telephone Encounter (Signed)
Routing to [employer]. This is an [facility] pt.

## 2014-12-05 NOTE — Telephone Encounter (Signed)
Please call patient and find out what happened yesterday. She went to ER and had labs but looks like she didn't wait to be seen by ED physician.   She had a significant drop in her Hgb from 13 to 10.  How is she doing? How many black stools? Why did she leave ER?

## 2014-12-13 ENCOUNTER — Other Ambulatory Visit: Payer: Self-pay

## 2014-12-14 ENCOUNTER — Encounter (HOSPITAL_COMMUNITY): Payer: Self-pay | Admitting: *Deleted

## 2014-12-14 ENCOUNTER — Ambulatory Visit (HOSPITAL_COMMUNITY)
Admission: RE | Admit: 2014-12-14 | Discharge: 2014-12-14 | Disposition: A | Discharge status: Home / Self Care | Payer: Medicare Other | Source: Ambulatory Visit | Attending: Gastroenterology | Admitting: Gastroenterology

## 2014-12-14 ENCOUNTER — Encounter (HOSPITAL_COMMUNITY)
Admission: RE | Disposition: A | Discharge status: Home / Self Care | Payer: Self-pay | Source: Ambulatory Visit | Attending: Gastroenterology

## 2014-12-14 DIAGNOSIS — K3189 Other diseases of stomach and duodenum: Secondary | ICD-10-CM | POA: Diagnosis not present

## 2014-12-14 DIAGNOSIS — K7460 Unspecified cirrhosis of liver: Secondary | ICD-10-CM | POA: Diagnosis not present

## 2014-12-14 DIAGNOSIS — Z87891 Personal history of nicotine dependence: Secondary | ICD-10-CM | POA: Insufficient documentation

## 2014-12-14 DIAGNOSIS — F329 Major depressive disorder, single episode, unspecified: Secondary | ICD-10-CM | POA: Insufficient documentation

## 2014-12-14 DIAGNOSIS — F319 Bipolar disorder, unspecified: Secondary | ICD-10-CM | POA: Insufficient documentation

## 2014-12-14 DIAGNOSIS — I8500 Esophageal varices without bleeding: Secondary | ICD-10-CM

## 2014-12-14 DIAGNOSIS — D696 Thrombocytopenia, unspecified: Secondary | ICD-10-CM | POA: Diagnosis not present

## 2014-12-14 DIAGNOSIS — I120 Hypertensive chronic kidney disease with stage 5 chronic kidney disease or end stage renal disease: Secondary | ICD-10-CM | POA: Diagnosis not present

## 2014-12-14 DIAGNOSIS — F419 Anxiety disorder, unspecified: Secondary | ICD-10-CM | POA: Diagnosis not present

## 2014-12-14 DIAGNOSIS — Z888 Allergy status to other drugs, medicaments and biological substances status: Secondary | ICD-10-CM | POA: Diagnosis not present

## 2014-12-14 DIAGNOSIS — K766 Portal hypertension: Secondary | ICD-10-CM | POA: Diagnosis not present

## 2014-12-14 DIAGNOSIS — K219 Gastro-esophageal reflux disease without esophagitis: Secondary | ICD-10-CM | POA: Insufficient documentation

## 2014-12-14 DIAGNOSIS — N186 End stage renal disease: Secondary | ICD-10-CM | POA: Insufficient documentation

## 2014-12-14 DIAGNOSIS — J189 Pneumonia, unspecified organism: Secondary | ICD-10-CM | POA: Diagnosis not present

## 2014-12-14 DIAGNOSIS — Z992 Dependence on renal dialysis: Secondary | ICD-10-CM | POA: Insufficient documentation

## 2014-12-14 DIAGNOSIS — Z886 Allergy status to analgesic agent status: Secondary | ICD-10-CM | POA: Insufficient documentation

## 2014-12-14 HISTORY — PX: ESOPHAGOGASTRODUODENOSCOPY: SHX5428

## 2014-12-14 SURGERY — EGD (ESOPHAGOGASTRODUODENOSCOPY)
Anesthesia: Moderate Sedation

## 2014-12-14 MED ORDER — STERILE WATER FOR IRRIGATION IR SOLN
Status: DC | PRN
  Administered 2014-12-14: 10:00:00

## 2014-12-14 MED ORDER — FENTANYL CITRATE (PF) 100 MCG/2ML IJ SOLN
INTRAMUSCULAR | Status: AC
  Filled 2014-12-14: qty 4

## 2014-12-14 MED ORDER — MIDAZOLAM HCL 5 MG/5ML IJ SOLN
INTRAMUSCULAR | Status: AC
  Filled 2014-12-14: qty 10

## 2014-12-14 MED ORDER — PROMETHAZINE HCL 25 MG/ML IJ SOLN
INTRAMUSCULAR | Status: DC | PRN
  Administered 2014-12-14: 12.5 mg via INTRAVENOUS

## 2014-12-14 MED ORDER — FENTANYL CITRATE (PF) 100 MCG/2ML IJ SOLN
INTRAMUSCULAR | Status: DC | PRN
  Administered 2014-12-14: 50 ug via INTRAVENOUS
  Administered 2014-12-14 (×2): 25 ug via INTRAVENOUS

## 2014-12-14 MED ORDER — SODIUM CHLORIDE 0.9 % IJ SOLN
INTRAMUSCULAR | Status: AC
  Filled 2014-12-14: qty 12

## 2014-12-14 MED ORDER — LIDOCAINE VISCOUS 2 % MT SOLN
OROMUCOSAL | Status: AC
  Filled 2014-12-14: qty 15

## 2014-12-14 MED ORDER — CIPROFLOXACIN IN D5W 400 MG/200ML IV SOLN
400.0000 mg | Freq: Once | INTRAVENOUS | Status: AC
  Administered 2014-12-14: 400 mg via INTRAVENOUS

## 2014-12-14 MED ORDER — MIDAZOLAM HCL 5 MG/5ML IJ SOLN
INTRAMUSCULAR | Status: DC | PRN
  Administered 2014-12-14 (×2): 2 mg via INTRAVENOUS
  Administered 2014-12-14: 1 mg via INTRAVENOUS
  Administered 2014-12-14: 2 mg via INTRAVENOUS
  Administered 2014-12-14: 1 mg via INTRAVENOUS

## 2014-12-14 MED ORDER — CIPROFLOXACIN IN D5W 400 MG/200ML IV SOLN
INTRAVENOUS | Status: AC
  Filled 2014-12-14: qty 200

## 2014-12-14 MED ORDER — MEPERIDINE HCL 100 MG/ML IJ SOLN
INTRAMUSCULAR | Status: AC
  Filled 2014-12-14: qty 2

## 2014-12-14 MED ORDER — LIDOCAINE VISCOUS 2 % MT SOLN
OROMUCOSAL | Status: DC | PRN
  Administered 2014-12-14: 4 mL via OROMUCOSAL

## 2014-12-14 NOTE — Discharge Instructions (Signed)
You have esophageal varices DUE TO SCARRING IN YOUR LIVER.    CONTINUE LACTULOSE TO HAVE 3-4 BMs A DAY. HOLD FOR LOOSE STOOLS OF DIARRHEA.  Paracentesis NEXT WED MAY 11.    FOLLOW UP IN JUL 2016.  REPEAT EGD IN AUG 2016.    UPPER ENDOSCOPY AFTER CARE Read the instructions outlined below and refer to this sheet in the next week. These discharge instructions provide you with general information on caring for yourself after you leave the hospital. While your treatment has been planned according to the most current medical practices available, unavoidable complications occasionally occur. If you have any problems or questions after discharge, call DR. Braylen Denunzio, [phone].  ACTIVITY  You may resume your regular activity, but move at a slower pace for the next 24 hours.   Take frequent rest periods for the next 24 hours.   Walking will help get rid of the air and reduce the bloated feeling in your belly (abdomen).   No driving for 24 hours (because of the medicine (anesthesia) used during the test).   You may shower.   Do not sign any important legal documents or operate any machinery for 24 hours (because of the anesthesia used during the test).    NUTRITION  Drink plenty of fluids.   You may resume your normal diet as instructed by your doctor.   Begin with a light meal and progress to your normal diet. Heavy or fried foods are harder to digest and may make you feel sick to your stomach (nauseated).   Avoid alcoholic beverages for 24 hours or as instructed.    MEDICATIONS  You may resume your normal medications.   WHAT YOU CAN EXPECT TODAY  Some feelings of bloating in the abdomen.   Passage of more gas than usual.    IF YOU HAD A BIOPSY TAKEN DURING THE UPPER ENDOSCOPY:  Eat a soft diet IF YOU HAVE NAUSEA, BLOATING, ABDOMINAL PAIN, OR VOMITING.    FINDING OUT THE RESULTS OF YOUR TEST Not all test results are available during your visit. DR. Oneida Alar WILL CALL  YOU WITHIN 7 DAYS OF YOUR PROCEDUE WITH YOUR RESULTS. Do not assume everything is normal if you have not heard from DR. Keondra Haydu IN ONE WEEK, CALL HER OFFICE AT [phone].  SEEK IMMEDIATE MEDICAL ATTENTION AND CALL THE OFFICE: [phone] IF:  You have more than a spotting of blood in your stool.   Your belly is swollen (abdominal distention).   You are nauseated or vomiting.   You have a temperature over 101F.   You have abdominal pain or discomfort that is severe or gets worse throughout the day.

## 2014-12-14 NOTE — H&P (Signed)
Primary Care Physician:  [name] Primary Gastroenterologist:  Dr. Oneida Alar  Pre-Procedure History & Physical: HPI:  Wanda [name] is a 39 y.o. female here for [place].  Past Medical History  Diagnosis Date  . GERD (gastroesophageal reflux disease)   . 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 c/w steatohepatitis  . Folate deficiency 09/2013  . Anasarca 10/10/2013  . Hematemesis/vomiting blood 02/24/2014  . Acute blood loss anemia 02/25/2014    Status post transfusion  . Macrocytosis 02/28/2014  . 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 Acute renal failure 09/2013    Pre-renal- resolved  . C. difficile colitis 04/19/2014  . Anxiety   . Depression   . Thrombocytopenia     Hypercellular bone marrow; abundant megakaryocytes per 08/27/2014; s/p bone marrow bx  . Cirrhosis of liver with ascites   . SBP (spontaneous bacterial peritonitis) 11/10/2013  . Bipolar disorder 12/04/2013    2007-SEEN IN ED FOR INVOLUNTARY COMMITMENT, UDS POS FOR AMPHETAMINES/OPIATES   . PNA (pneumonia) 10/13/2013  . Chronic hypotension   . Gastroesophageal junction ulcer 09/17/2014  . ESRD (end stage renal disease) on dialysis 08/2014    Past Surgical History  Procedure Laterality Date  . None    . Paracentesis  Feb 2015    1180 fluid, negative fluid analysis.   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 Paracentesis  10/2013  . Colonoscopy N/A 12/19/2013    SLF:NO OBVIOUS SOURCE FOR ANEMIA IDETIFIED/ONE COLON POLYP REMOVED/Small internal hemorrhoids  . Esophagogastroduodenoscopy N/A 02/11/2014    Dr. Rourk:Esophageal varices with bleeding stigmata-status post esophageal band ligation therapy. Portal gastropathy  . Esophagogastroduodenoscopy N/A 07/04/2014    RMR: Persiting grade 2 esophageal  varicies with bleeding stigmata status post band ligation. Significantly congested gastric mucosa iwith changes constistant with protal gastropathy.   . Esophageal banding  07/04/2014    Procedure: ESOPHAGEAL BANDING;  Surgeon: Daneil Dolin, MD;  Location: [facility];  Service: Endoscopy;;  . Esophagogastroduodenoscopy (egd) with propofol N/A 07/24/2014    SLF:  1. 2 columns grade 2-3 varices- 2 Bands applied.  2.  Moderate gastropathy 3. Duodenal Diverticula  . Esophageal banding N/A 07/24/2014    Procedure: ESOPHAGEAL BANDING (2 bands applied);  Surgeon: Danie Binder, MD;  Location: [facility];  Service: Endoscopy;  Laterality: N/A;  . Central venous catheter insertion Right   . Esophagogastroduodenoscopy N/A 09/16/2014    Rehman: Single short column of varix proximal to GE junction not large enough to be banded. Two amall ulcers at the GEJ felt to be source of GI Bleeding but no active bleeding but no actibe bleeding noted. No therapy rendered. Portal gastropathy NO evidence of peptic ulcer diease or gastric varices.   . Esophagogastroduodenoscopy (egd) with propofol N/A 10/26/2014    RMR: 2 columns of grade 2 esophageal varices without obvious bleeding stigmata status post band ligation to complet obliteration of remaining varices.   . Av fistula placement Right 11/16/2014    Procedure: Right arm Creation of arteriovenous fistula;  Surgeon: Angelia Mould, MD;  Location: [place];  Service: Vascular;  Laterality: Right;    Prior to Admission medications   Medication Sig Start Date End Date Taking? Authorizing Provider  folic acid (FOLVITE) 1 MG tablet Take 1 tablet (1 mg total) by mouth daily.  10/02/14  Yes Manon Hilding Kefalas, PA-C  lactulose (CHRONULAC) 10 GM/15ML solution Take 15 mLs (10 g total) by mouth 2 (two) times daily. 09/19/14  Yes Kathie Dike, MD  loperamide (IMODIUM) 2 MG capsule Take 1 capsule (2 mg total) by mouth 2 (two) times daily as needed for diarrhea or loose stools.  08/28/14  Yes Lezlie Octave Black, NP  LORazepam (ATIVAN) 1 MG tablet HALF TO 1 TAB PO TWICE DAILY FOR BACK SPASM OR ANXIETY 11/14/14  Yes Danie Binder, MD  metolazone (ZAROXOLYN) 5 MG tablet Take 2 tablets (10 mg total) by mouth daily. 09/21/14  Yes Orvil Feil, NP  midodrine (PROAMATINE) 10 MG tablet Take 1 tablet (10 mg total) by mouth 2 (two) times daily with a meal. Medication to keep your blood pressure from falling. 04/27/14  Yes Rexene Alberts, MD  Oxycodone HCl 10 MG TABS 1 PO TID AS NEEDED FOR PAIN 11/14/14  Yes Danie Binder, MD  oxyCODONE-acetaminophen (ROXICET) 5-325 MG per tablet Take 1-2 tablets by mouth every 4 (four) hours as needed for severe pain. 11/16/14  Yes Angelia Mould, MD  pantoprazole (PROTONIX) 40 MG tablet Take 1 tablet (40 mg total) by mouth 2 (two) times daily. 09/19/14  Yes Kathie Dike, MD  promethazine (PHENERGAN) 12.5 MG tablet Take 1 tablet (12.5 mg total) by mouth every 6 (six) hours as needed for nausea or vomiting. 09/05/14  Yes Danie Binder, MD  ciprofloxacin (CIPRO) 500 MG tablet Take 1 tablet (500 mg total) by mouth daily. Patient not taking: Reported on 11/14/2014 11/01/14   Erline Hau, MD  lidocaine (XYLOCAINE) 2 % solution 2 TSP  PO QAC AND HS TO PREVENT FOR CHEST PAIN WHILE EATING Patient not taking: Reported on 11/14/2014 07/26/14   Danie Binder, MD  Nutritional Supplements (FEEDING SUPPLEMENT, NEPRO CARB STEADY,) LIQD Take 237 mLs by mouth as needed (missed meal during dialysis.). Patient not taking: Reported on 11/14/2014 08/28/14   Radene Gunning, NP    Allergies as of 11/14/2014 - Review Complete 11/14/2014  Allergen Reaction Noted  . Lasix [furosemide]  04/12/2014  . Morphine and related Hives and Itching 09/16/2014    Family History  Problem Relation Age of Onset  . Heart disease Mother   . Colon cancer Neg Hx   . Liver disease Neg Hx     History   Social History  . Marital Status: Single    Spouse Name: N/A  . Number of  Children: 1  . Years of Education: N/A   Occupational History  . unemployed    Social History Main Topics  . Smoking status: Former Smoker -- 0.25 packs/day for 20 years    Types: Cigarettes    Quit date: 11/05/2008  . Smokeless tobacco: Never Used  . Alcohol Use: No  . Drug Use: No  . Sexual Activity:    Partners: Male    Birth Control/ Protection: None   Other Topics Concern  . Not on file   Social History Narrative    Review of Systems: See HPI, otherwise negative ROS   Physical Exam: There were no vitals taken for this visit. General:   Alert,  pleasant and cooperative in NAD Head:  Normocephalic and atraumatic. Neck:  Supple; Lungs:  Clear throughout to auscultation.    Heart:  Regular rate and rhythm. Abdomen:  Soft, nontender and distended. Normal bowel sounds, without guarding, and without rebound.   Neurologic:  Alert and  oriented x4;  grossly normal neurologically.  Impression/Plan:   SCREENING VARICES  PLAN:  1.EGD TODAY

## 2014-12-14 NOTE — Op Note (Signed)
[facility] [address] [place], [address]   ENDOSCOPY PROCEDURE REPORT  PATIENT: Wanda, [name]  MR#: [id number] BIRTHDATE: 01/21/1976 , 38  yrs. old GENDER: female  ENDOSCOPIST: Danie Binder, MD REFERRED BY:  PROCEDURE DATE: 01-12-2015 PROCEDURE:   EGD, diagnostic  INDICATIONS:screening for varices. MEDICATIONS: Fentanyl 125 mcg IV, Promethazine (Phenergan) 12.5 mg IV, and Versed 8 mg IV TOPICAL ANESTHETIC:   Viscous Xylocaine ASA CLASS:  DESCRIPTION OF PROCEDURE:     Physical exam was performed.  Informed consent was obtained from the patient after explaining the benefits, risks, and alternatives to the procedure.  The patient was connected to the monitor and placed in the left lateral position.  Continuous oxygen was provided by nasal cannula and IV medicine administered through an indwelling cannula.  After administration of sedation, the patients esophagus was intubated and the EG-[id number] ([id number])  endoscope was advanced under direct visualization to the second portion of the duodenum.  The scope was removed slowly by carefully examining the color, texture, anatomy, and integrity of the mucosa on the way out.  The patient was recovered in endoscopy and discharged home in satisfactory condition.   ESOPHAGUS: GRADE 1 ESOPHAGEAL VARICES.  NL GE JUNCTION.   STOMACH: Moderate PORTAL HYPERTENSIVE gastropathy was found in the cardia and gastric fundus.   DUODENUM: The duodenal mucosa showed no abnormalities in the bulb and 2nd part of the duodenum. COMPLICATIONS: There were no immediate complications.  ENDOSCOPIC IMPRESSION: 1.   GRADE 1 ESOPHAGEAL VARICES. 2.   MODERATE PORTAL Gastropathy  RECOMMENDATIONS: CONTINUE LACTULOSE TO HAVE 3-4 BMs A DAY.  HOLD FOR LOOSE STOOLS OF DIARRHEA. Paracentesis NEXT WED MAY 11. FOLLOW UP IN JUL 2016. REPEAT EGD IN AUG 2016.  REPEAT EXAM:   eSigned:  Danie Binder, MD 2015-01-12 10:33 AM     CPT  CODES: ICD CODES:  The ICD and CPT codes recommended by this software are interpretations from the data that the clinical staff has captured with the software.  The verification of the translation of this report to the ICD and CPT codes and modifiers is the sole responsibility of the health care institution and practicing physician where this report was generated.  [employer] will not be held responsible for the validity of the ICD and CPT codes included on this report.  AMA assumes no liability for data contained or not contained herein. CPT is a Designer, television/film set of the [employer].

## 2014-12-15 IMAGING — US US ABDOMEN LIMITED
1 series · 5 of 5 positions shown · non-contrast
Comparison: 04/27/2014

CLINICAL DATA: Ascites, personal history of cirrhosis, anasarca,
macrocytosis, GERD, hepatic encephalopathy, prior spontaneous
bacterial peritonitis

EXAM:
LIMITED ABDOMEN ULTRASOUND FOR ASCITES
TECHNIQUE: Limited ultrasound survey for ascites was performed in all four
abdominal quadrants.

[Series 1: us abdomen limited · 0.26mm/px · 5 of 5 slices shown]
[im 1/5]
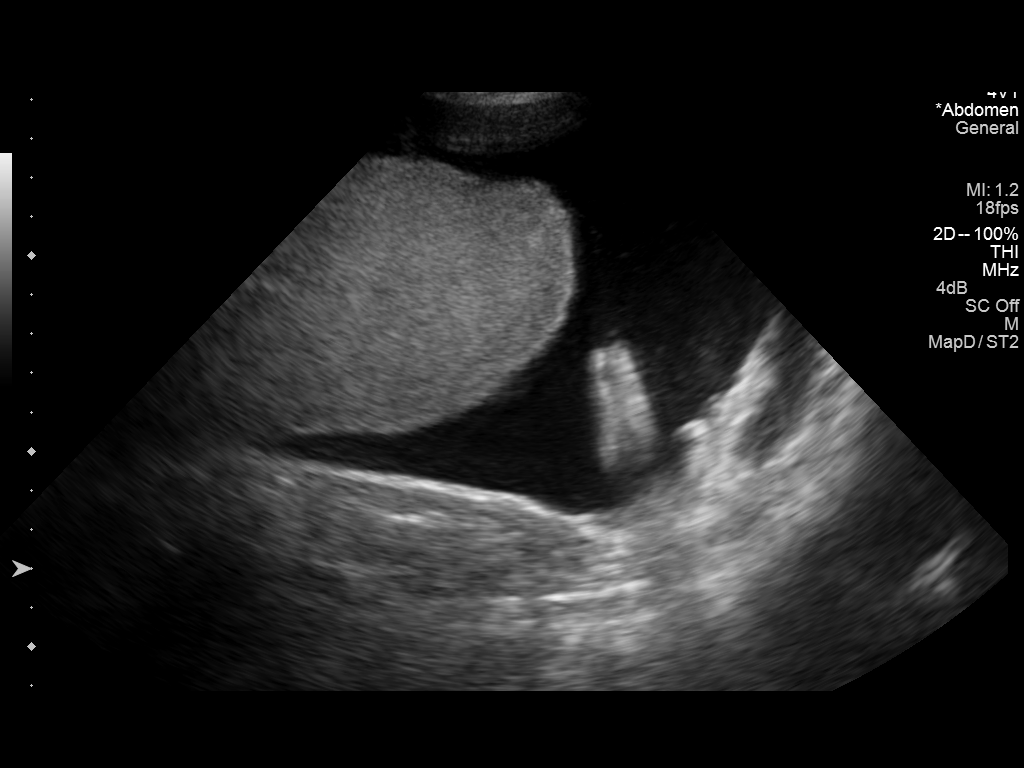
[im 2/5]
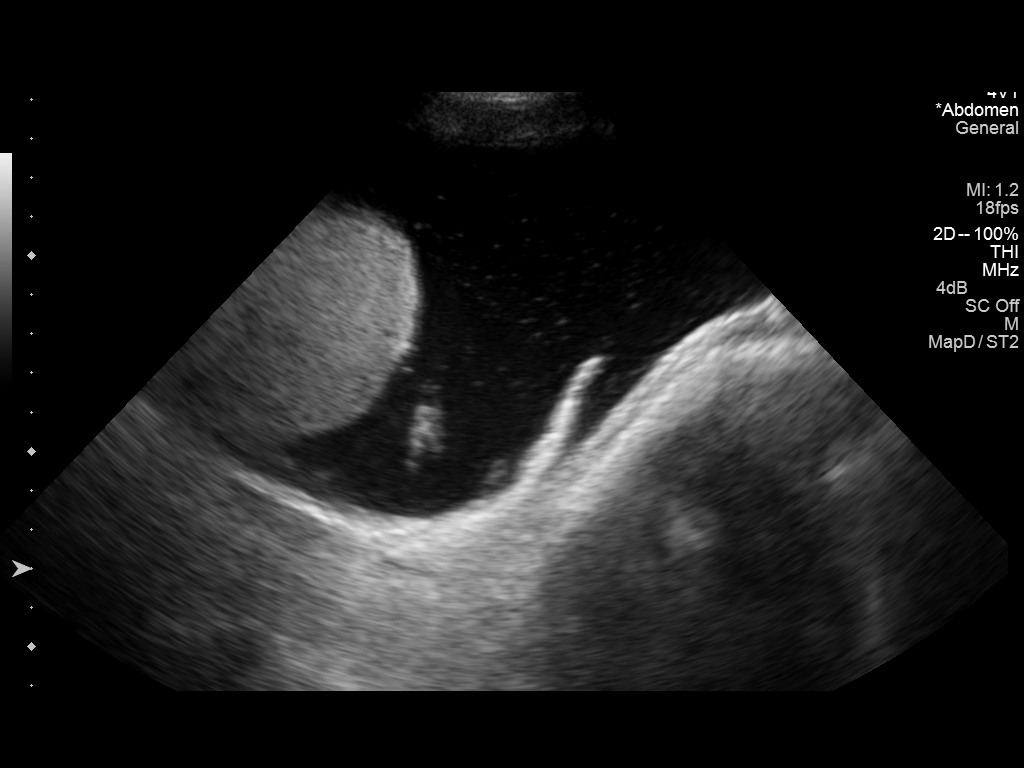
[im 3/5]
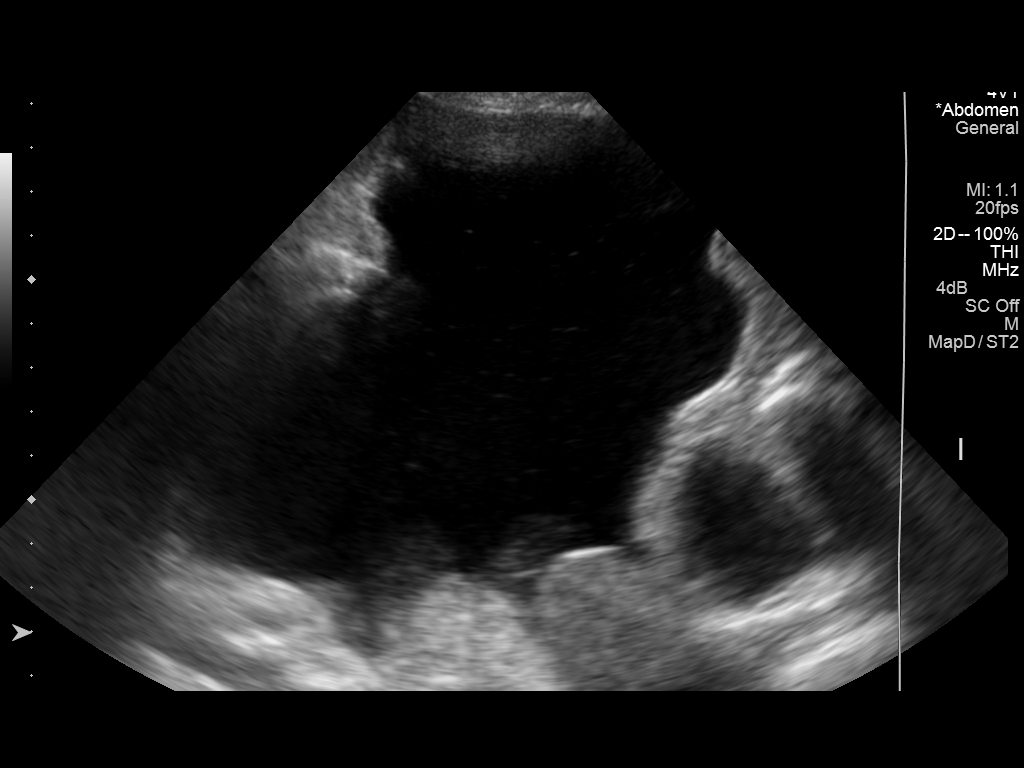
[im 4/5]
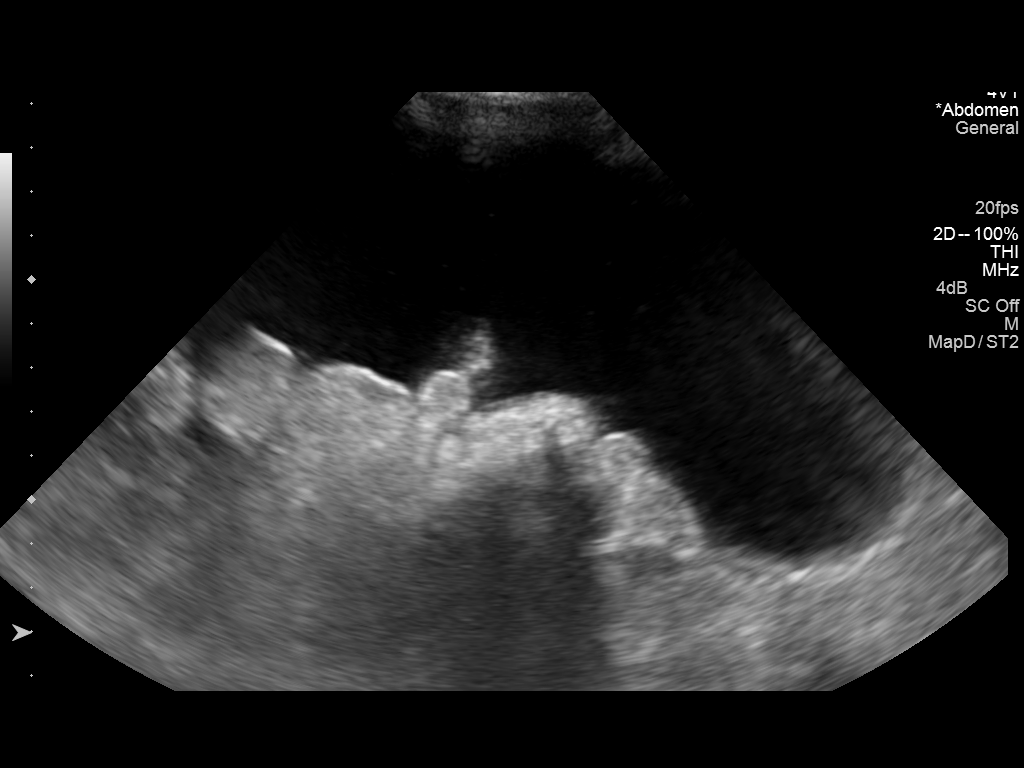
[im 5/5]
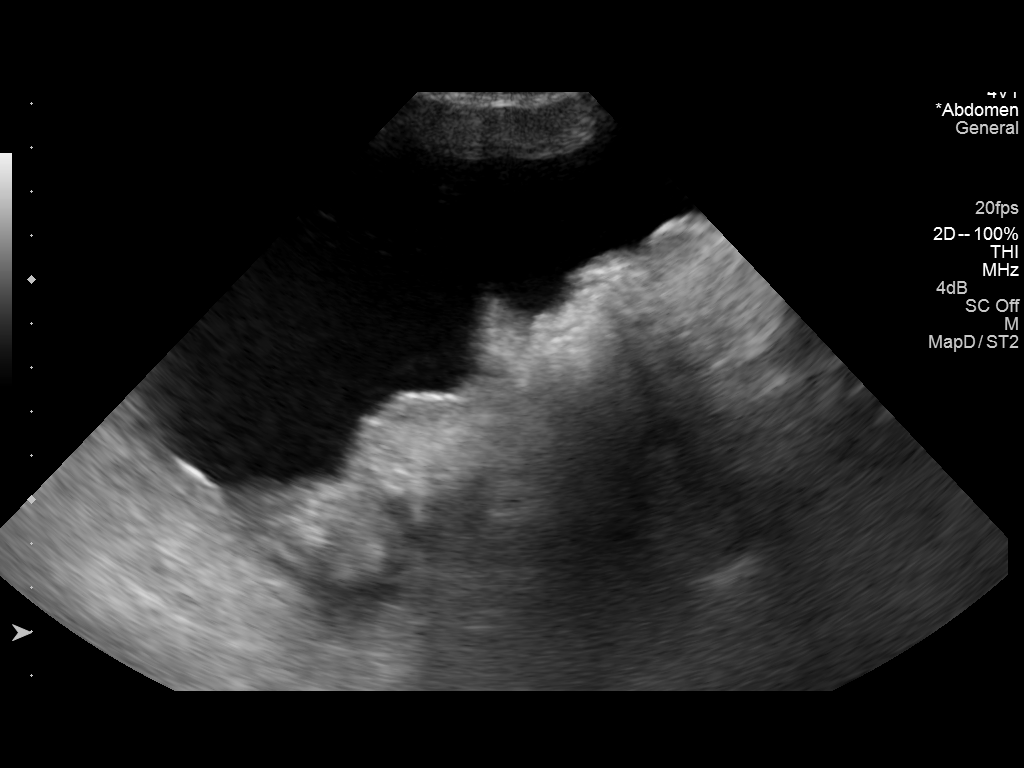

[5 of 5 positions shown; findings below may reference images not displayed]

FINDINGS: Significant ascites identified in all 4 quadrants.

Liver margin appears nodular and rounded on image obtained in the
RIGHT upper quadrant.
IMPRESSION: Significant ascites, adequate for paracentesis.

## 2014-12-17 ENCOUNTER — Other Ambulatory Visit: Payer: Self-pay

## 2014-12-17 ENCOUNTER — Encounter (HOSPITAL_COMMUNITY): Payer: Self-pay | Admitting: Gastroenterology

## 2014-12-17 DIAGNOSIS — R188 Other ascites: Secondary | ICD-10-CM

## 2014-12-19 ENCOUNTER — Encounter (HOSPITAL_COMMUNITY): Payer: Self-pay

## 2014-12-19 ENCOUNTER — Ambulatory Visit (HOSPITAL_COMMUNITY)
Admission: RE | Admit: 2014-12-19 | Discharge: 2014-12-19 | Disposition: A | Discharge status: Home / Self Care | Payer: Medicare Other | Source: Ambulatory Visit | Attending: Gastroenterology | Admitting: Gastroenterology

## 2014-12-19 DIAGNOSIS — K7460 Unspecified cirrhosis of liver: Secondary | ICD-10-CM | POA: Diagnosis not present

## 2014-12-19 DIAGNOSIS — R188 Other ascites: Secondary | ICD-10-CM | POA: Insufficient documentation

## 2014-12-19 MED ORDER — ALBUMIN HUMAN 25 % IV SOLN
INTRAVENOUS | Status: AC
  Administered 2014-12-19: 25 g
  Filled 2014-12-19: qty 100

## 2014-12-19 NOTE — Progress Notes (Signed)
Paracentesis complete no signs of distress. 5900 ml amber colored ascites removed.

## 2014-12-31 ENCOUNTER — Encounter: Payer: Self-pay | Admitting: Vascular Surgery

## 2015-01-01 ENCOUNTER — Encounter: Payer: Self-pay | Admitting: Vascular Surgery

## 2015-01-02 ENCOUNTER — Encounter: Payer: Self-pay | Admitting: Vascular Surgery

## 2015-01-02 ENCOUNTER — Encounter (HOSPITAL_COMMUNITY): Payer: Self-pay

## 2015-01-15 IMAGING — US US ABDOMEN LIMITED
1 series · 13 of 25 positions shown · non-contrast
Comparison: None.

CLINICAL DATA: Hepatic cirrhosis

EXAM:
US ABDOMEN LIMITED - RIGHT UPPER QUADRANT

[Series 1: us abdomen limited · 0.21mm/px · 13 of 56 slices shown]
[im 1/56]
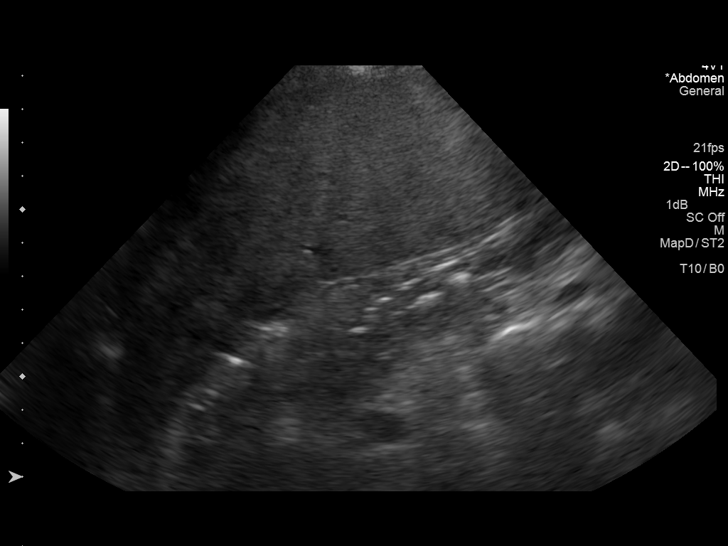
[im 5/56]
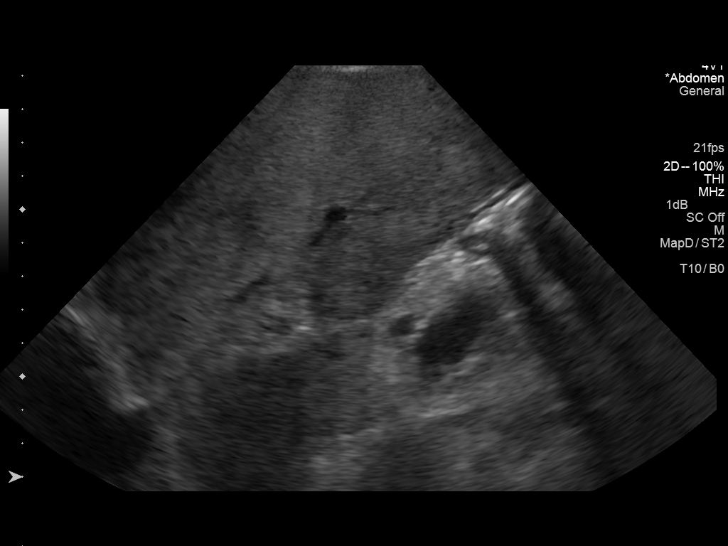
[im 10/56]
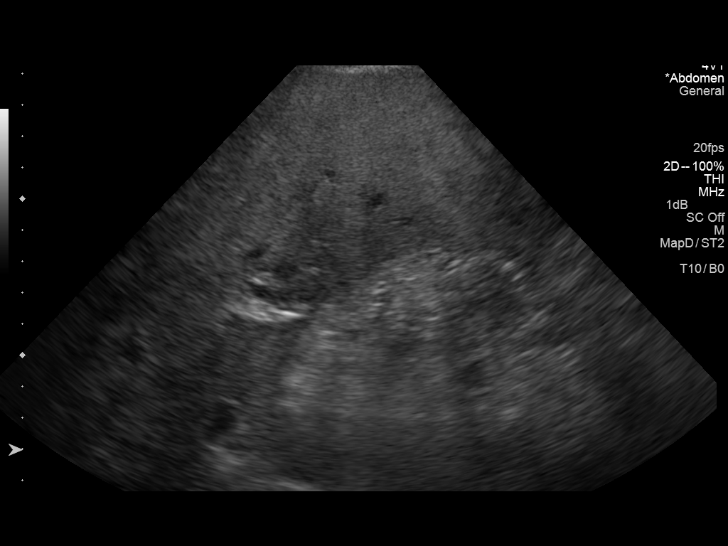
[im 14/56]
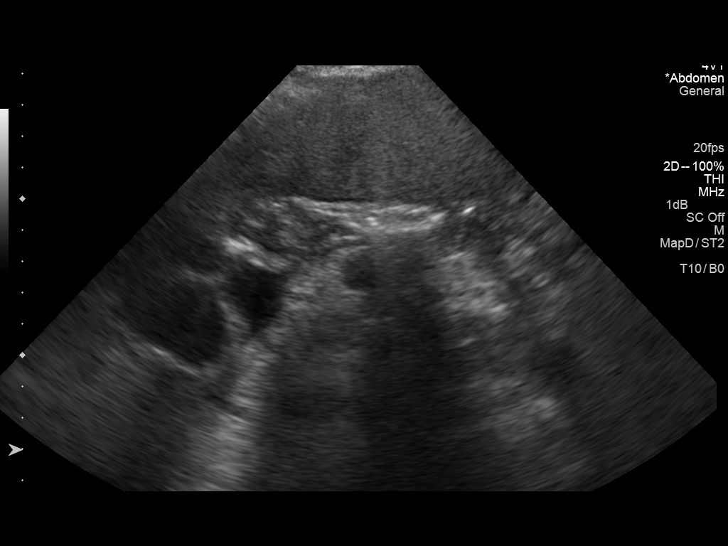
[im 19/56]
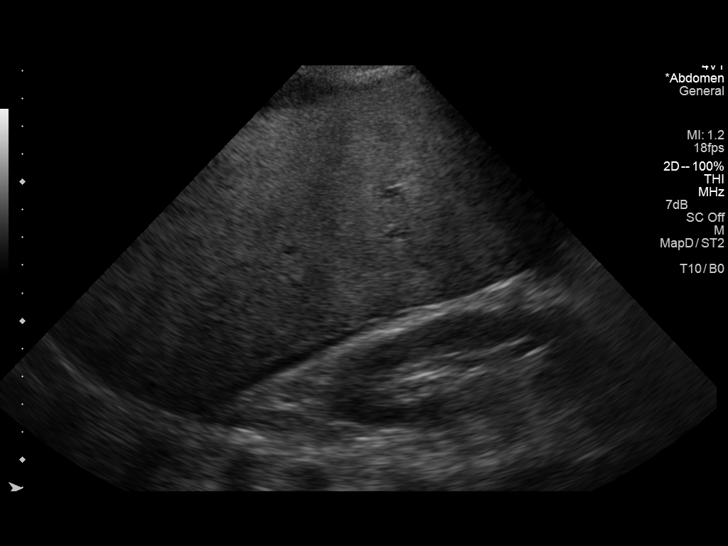
[im 23/56]
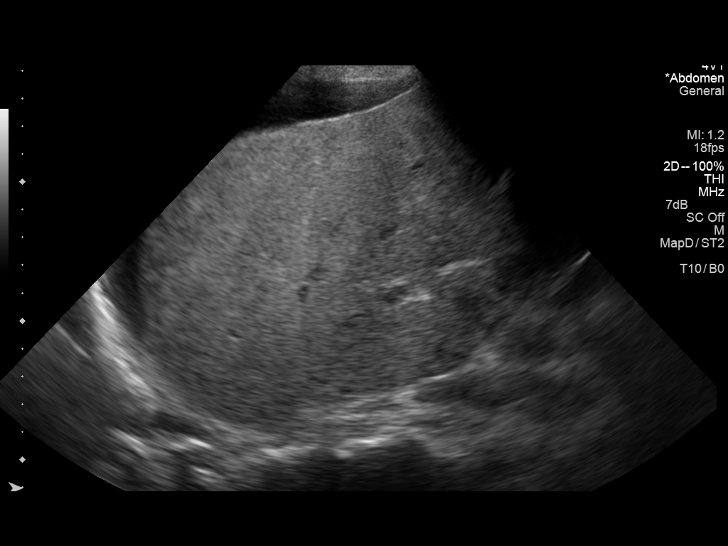
[im 28/56]
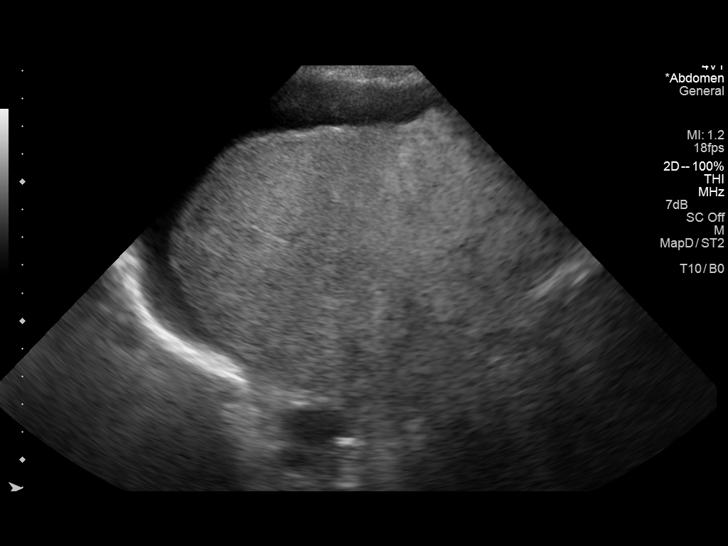
[im 33/56]
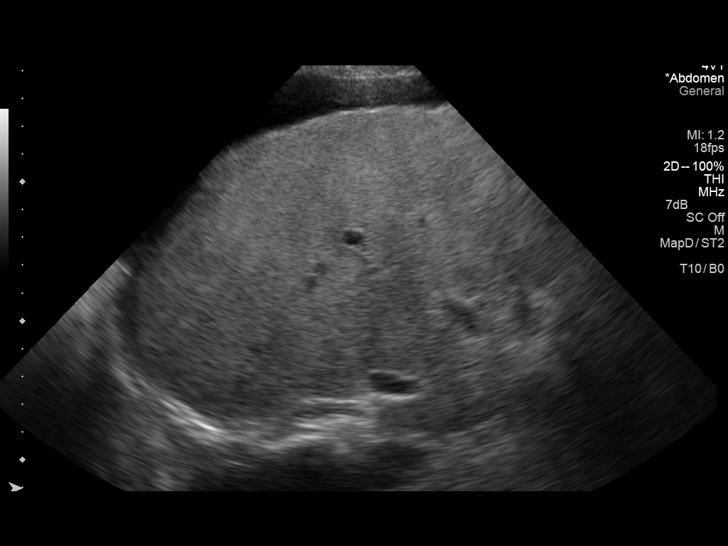
[im 37/56]
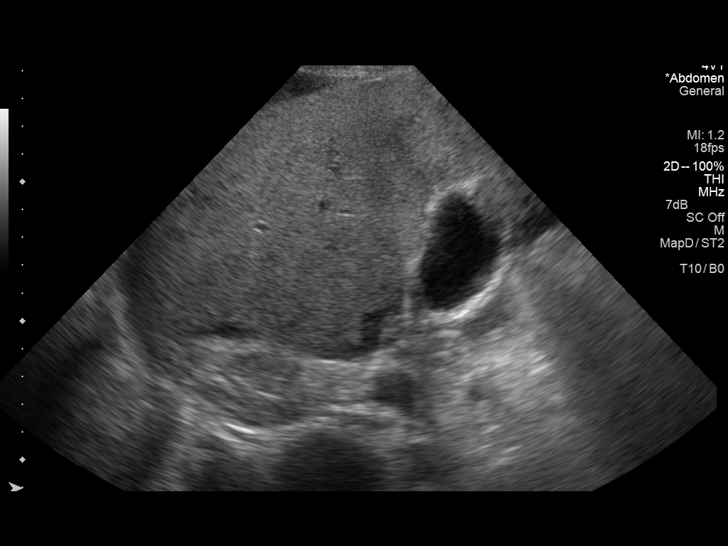
[im 42/56]
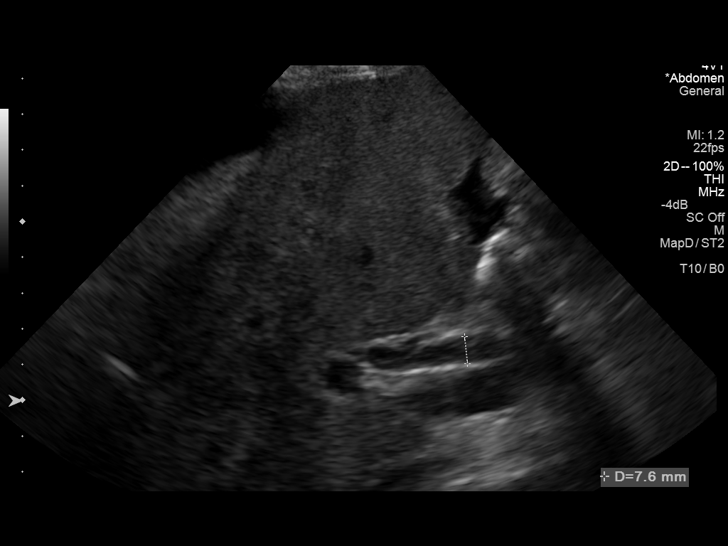
[im 46/56]
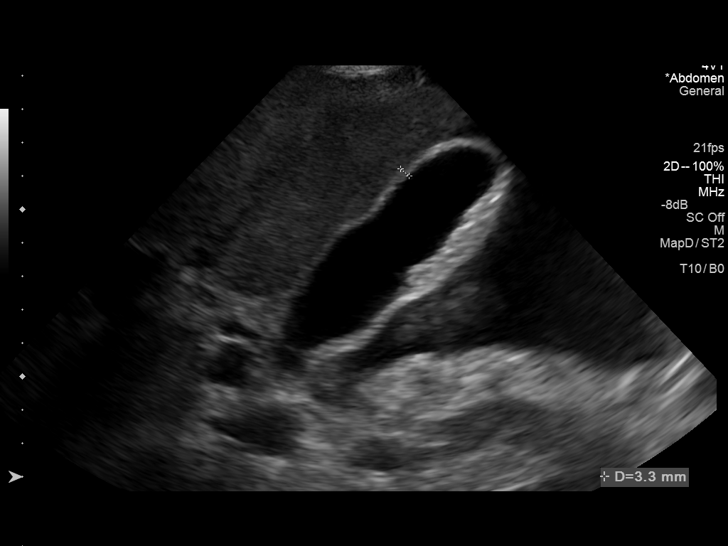
[im 51/56]
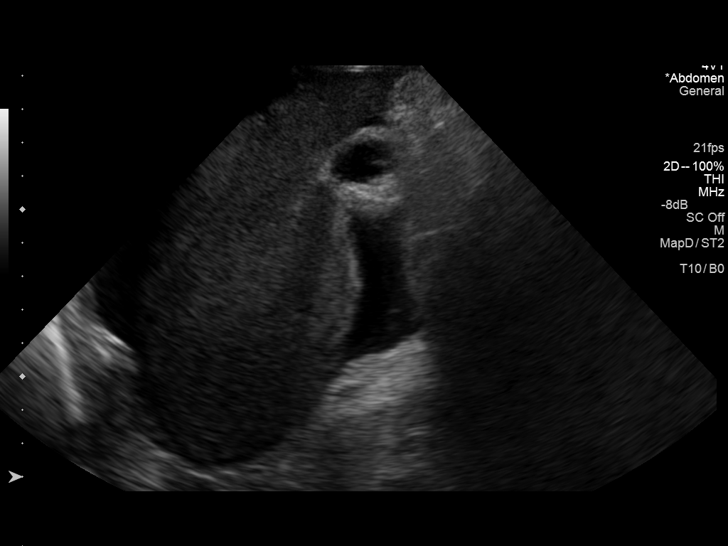
[im 56/56]
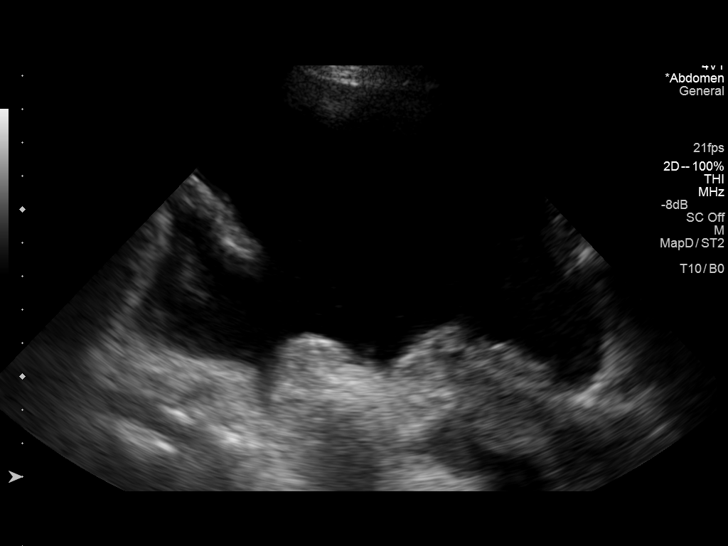

[13 of 25 positions shown; findings below may reference images not displayed]

FINDINGS: Gallbladder:

There is sludge within the gallbladder. No gallstones are seen.
Gallbladder wall is mildly thickened. There is no pericholecystic
fluid. No sonographic Murphy sign noted.

Common bile duct:

Diameter: 8 mm, borderline prominent. No mass or calculus is
appreciated.

Liver:

No focal lesion identified. Liver is enlarged measuring
approximately 19 cm in length. Liver echogenicity is increased. The
liver has a nodular contour consistent with underlying cirrhosis.

There is moderate ascites.
IMPRESSION: Ascites.

Liver has an appearance consistent with underlying cirrhosis. While
no focal liver lesions are identified, it must be cautioned that the
sensitivity of ultrasound for focal liver lesions is significantly
diminished in this circumstance.

There is sludge in the gallbladder. Gallbladder wall is mildly
thickened. Thickening of gallbladder wall may be secondary to the
ascites which is present. This finding also potentially could
represent a degree of acalculus cholecystitis. If there is concern
clinically for cholecystitis, nuclear medicine hepatobiliary imaging
study could be helpful to assess for cystic duct patency.

Common bile duct mildly prominent without mass or calculus seen by
ultrasound.

## 2015-01-15 IMAGING — US US PARACENTESIS
1 series · 1 of 1 positions shown · non-contrast
Comparison: None.

CLINICAL DATA: Ascites

EXAM:
ULTRASOUND GUIDED  PARACENTESIS

[Series 1: us paracentesis · 0.26mm/px · 1 of 1 slices shown]
[im 1/1]
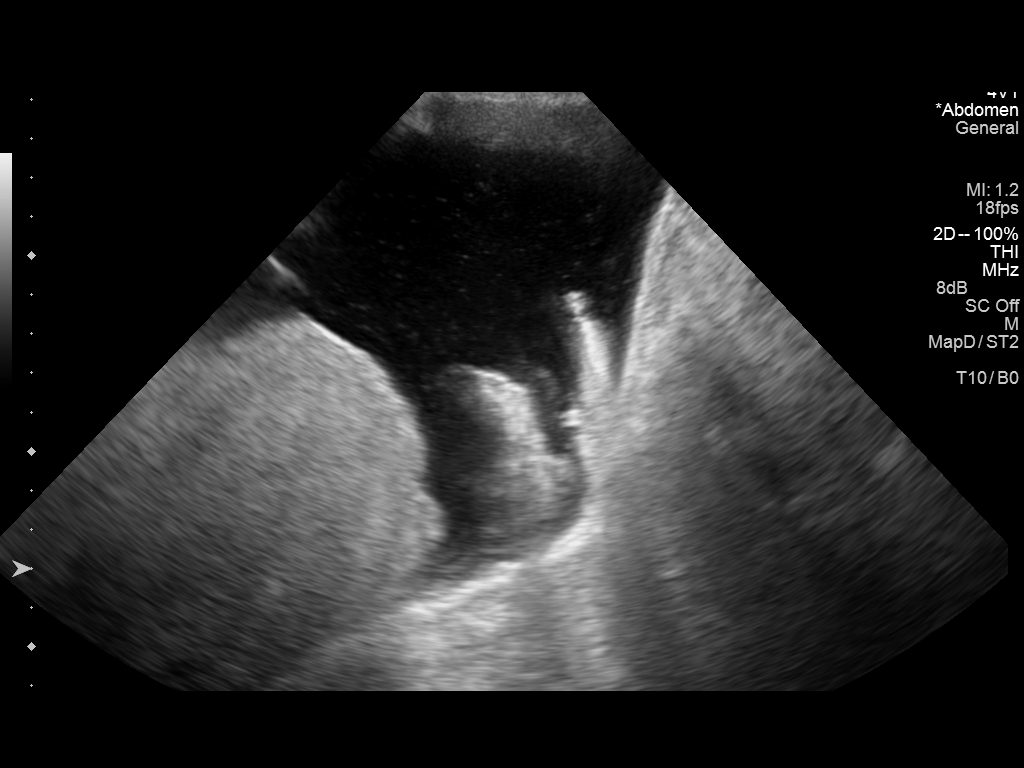

[1 of 1 positions shown; findings below may reference images not displayed]

PROCEDURE:
An ultrasound guided paracentesis was thoroughly discussed with the
patient and questions answered. The benefits, risks, alternatives
and complications were also discussed. The patient understands and
wishes to proceed with the procedure. Written consent was obtained.

Ultrasound was performed to localize and mark an adequate pocket of
fluid in the right lower quadrant of the abdomen. The area was then
prepped and draped in the normal sterile fashion. 1% Lidocaine was
used for local anesthesia. Under ultrasound guidance a 19 gauge Yueh
catheter was introduced. Paracentesis was performed. The catheter
was removed and a dressing applied.

Complications: None.
FINDINGS: A total of approximately 2.8 L of clear yellow fluid was removed. A
fluid sample was not sent for laboratory analysis.
IMPRESSION: Successful ultrasound guided paracentesis yielding 2.8 L of ascites.

## 2015-01-16 ENCOUNTER — Ambulatory Visit (HOSPITAL_COMMUNITY)
Admission: RE | Admit: 2015-01-16 | Discharge: 2015-01-16 | Disposition: A | Discharge status: Home / Self Care | Payer: Medicare Other | Source: Ambulatory Visit | Attending: Gastroenterology | Admitting: Gastroenterology

## 2015-01-16 ENCOUNTER — Encounter (HOSPITAL_COMMUNITY): Payer: Self-pay

## 2015-01-16 VITALS — BP 108/58 | HR 92 | Resp 16

## 2015-01-16 DIAGNOSIS — K7460 Unspecified cirrhosis of liver: Secondary | ICD-10-CM

## 2015-01-16 DIAGNOSIS — K7581 Nonalcoholic steatohepatitis (NASH): Secondary | ICD-10-CM

## 2015-01-16 DIAGNOSIS — R188 Other ascites: Secondary | ICD-10-CM | POA: Diagnosis not present

## 2015-01-16 MED ORDER — ALBUMIN HUMAN 25 % IV SOLN
INTRAVENOUS | Status: AC
  Administered 2015-01-16: 25 g
  Filled 2015-01-16: qty 100

## 2015-01-16 NOTE — Progress Notes (Signed)
Paracentesis complete no signs of distress. 8650 ml yellow colored ascites removed.

## 2015-01-25 ENCOUNTER — Ambulatory Visit (HOSPITAL_COMMUNITY)
Admission: RE | Admit: 2015-01-25 | Discharge: 2015-01-25 | Disposition: A | Discharge status: Home / Self Care | Payer: Medicare Other | Source: Ambulatory Visit | Attending: Gastroenterology | Admitting: Gastroenterology

## 2015-01-25 ENCOUNTER — Encounter: Payer: Self-pay | Admitting: Vascular Surgery

## 2015-01-25 ENCOUNTER — Encounter (HOSPITAL_COMMUNITY): Payer: Self-pay

## 2015-01-25 DIAGNOSIS — R188 Other ascites: Secondary | ICD-10-CM | POA: Diagnosis not present

## 2015-01-25 DIAGNOSIS — K7460 Unspecified cirrhosis of liver: Secondary | ICD-10-CM | POA: Diagnosis not present

## 2015-01-25 MED ORDER — ALBUMIN HUMAN 25 % IV SOLN
INTRAVENOUS | Status: AC
  Administered 2015-01-25: 25 g via INTRAVENOUS
  Filled 2015-01-25: qty 100

## 2015-01-25 MED ORDER — ALBUMIN HUMAN 25 % IV SOLN
25.0000 g | Freq: Once | INTRAVENOUS | Status: AC
  Administered 2015-01-25: 25 g via INTRAVENOUS

## 2015-01-25 NOTE — Progress Notes (Signed)
Paracentesis complete no signs of distress. 5650 ml amber colored ascites removed.

## 2015-01-25 NOTE — Procedures (Signed)
PreOperative Dx: Cirrhosis, ascites Postoperative Dx: Cirrhosis, ascites Procedure:   US guided paracentesis Radiologist:  Thornton Papas Anesthesia:  10 ml of 1% lidocaine Specimen:  5650 ml of amber colored ascitic fluid EBL:   < 1 ml Complications: None

## 2015-01-30 ENCOUNTER — Encounter (HOSPITAL_COMMUNITY): Payer: Medicare Other

## 2015-01-30 ENCOUNTER — Encounter: Payer: Medicare Other | Admitting: Vascular Surgery

## 2015-02-03 IMAGING — CR DG CHEST 1V PORT
1 series · 1 of 1 positions shown · non-contrast
Comparison: 04/25/2014

CLINICAL DATA: Endotracheal tube placement

EXAM:
PORTABLE CHEST - 1 VIEW

[portable]
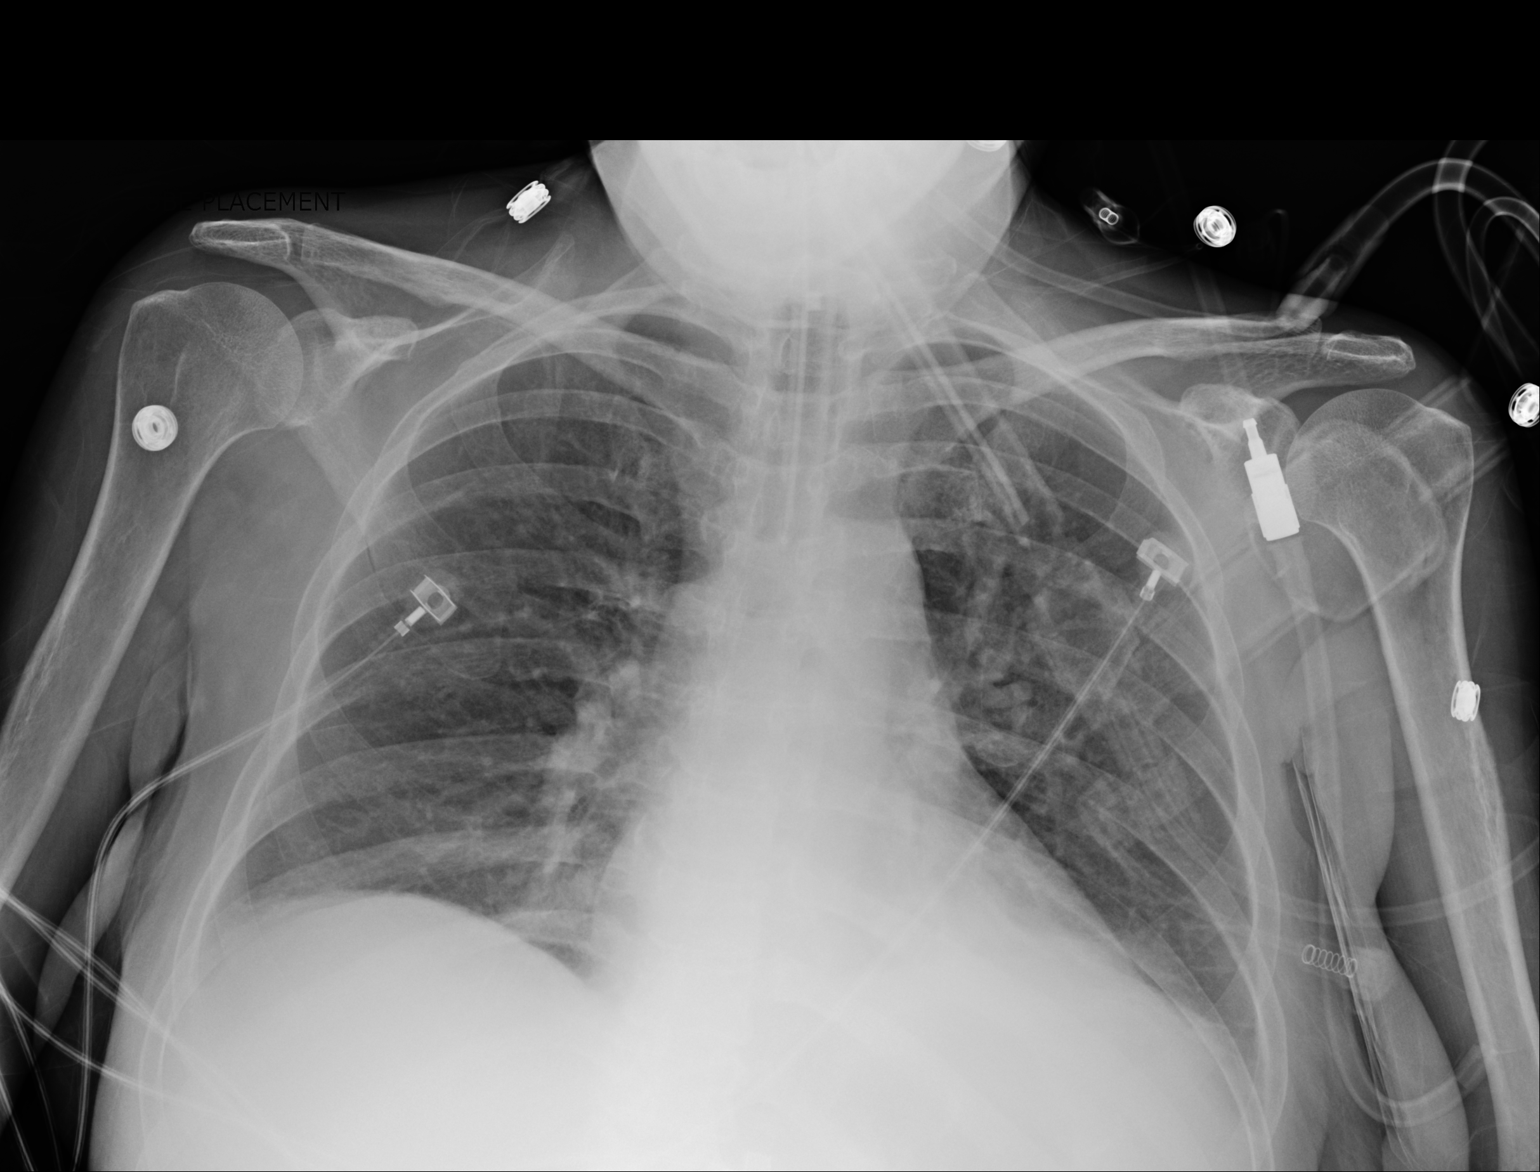

[1 of 1 positions shown; findings below may reference images not displayed]

FINDINGS: Cardiomediastinal silhouette is stable. No acute infiltrate or
pulmonary edema. Mild left basilar atelectasis. Endotracheal tube in
place with tip 2.6 cm above the carina. No pneumothorax.
IMPRESSION: Endotracheal tube in place with tip 2.6 cm above the carina. No
pneumothorax.

## 2015-02-12 ENCOUNTER — Encounter (HOSPITAL_COMMUNITY): Payer: Self-pay

## 2015-02-12 ENCOUNTER — Ambulatory Visit (HOSPITAL_COMMUNITY)
Admission: RE | Admit: 2015-02-12 | Discharge: 2015-02-12 | Disposition: A | Discharge status: Home / Self Care | Payer: Medicare Other | Source: Ambulatory Visit | Attending: Gastroenterology | Admitting: Gastroenterology

## 2015-02-12 ENCOUNTER — Ambulatory Visit (HOSPITAL_COMMUNITY): Admission: RE | Admit: 2015-02-12 | Payer: Medicare Other | Source: Ambulatory Visit

## 2015-02-12 DIAGNOSIS — K7460 Unspecified cirrhosis of liver: Secondary | ICD-10-CM | POA: Diagnosis not present

## 2015-02-12 DIAGNOSIS — R188 Other ascites: Secondary | ICD-10-CM | POA: Insufficient documentation

## 2015-02-12 MED ORDER — ALBUMIN HUMAN 25 % IV SOLN
25.0000 g | Freq: Once | INTRAVENOUS | Status: AC
  Administered 2015-02-12: 25 g via INTRAVENOUS

## 2015-02-12 MED ORDER — ALBUMIN HUMAN 25 % IV SOLN
INTRAVENOUS | Status: AC
  Administered 2015-02-12: 25 g via INTRAVENOUS
  Filled 2015-02-12: qty 100

## 2015-02-12 NOTE — Procedures (Signed)
PreOperative Dx: Cirrhosis, ascites Postoperative Dx: Cirrhosis, ascites Procedure:   US guided paracentesis Radiologist:  Thornton Papas Anesthesia:  10 ml of 1% lidocaine Specimen:  6000 ml of amber ascitic fluid EBL:   < 1 ml Complications: None

## 2015-02-12 NOTE — Progress Notes (Signed)
Paracentesis complete no sign so distress. 6000 ml amber colored ascites removed.

## 2015-02-14 ENCOUNTER — Other Ambulatory Visit: Payer: Self-pay

## 2015-02-14 MED ORDER — OXYCODONE-ACETAMINOPHEN 5-325 MG PO TABS: 1.0000 | ORAL_TABLET | ORAL | Status: DC | PRN

## 2015-02-14 NOTE — Telephone Encounter (Signed)
Pt is calling to see if she can have some pain medication until her follow up appointment. Please advise

## 2015-02-14 NOTE — Telephone Encounter (Signed)
FOR JUL/AUG/SEP

## 2015-02-21 ENCOUNTER — Telehealth: Payer: Self-pay

## 2015-02-21 DIAGNOSIS — K7460 Unspecified cirrhosis of liver: Secondary | ICD-10-CM

## 2015-02-21 DIAGNOSIS — T8092XA Unspecified transfusion reaction, initial encounter: Secondary | ICD-10-CM

## 2015-02-21 NOTE — Telephone Encounter (Signed)
I called Judeen Hammans. She said the pt was transfused on 04/21/2014. She received blood from donor that was collected in 03/2014. She said it was not clear if blood transfused at that time was tested for the Babesia.  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 It was recommended that the physician notify the patient and do screening if pt had developed any symptoms.

## 2015-02-21 NOTE — Telephone Encounter (Signed)
T/C from [place] at the [place] at [facility] [phone]). Judeen Hammans received a notice that a unit of blood the pt had been given was on recall for Babesia.  She said it was the second time the person had donated blood and the first time it was OK. Pt has appt to see Neil Crouch, PA 02/22/2015 at 11:00 AM. Please advise!

## 2015-02-21 NOTE — Telephone Encounter (Signed)
Please call the lab and find out WHEN the patient received the unit of blood that is on recall for Babesia.   Also, did the unit she receive test positive for Babesia. Just need clarification.

## 2015-02-22 ENCOUNTER — Ambulatory Visit (HOSPITAL_COMMUNITY)
Admission: RE | Admit: 2015-02-22 | Discharge: 2015-02-22 | Disposition: A | Discharge status: Home / Self Care | Payer: Medicare Other | Source: Ambulatory Visit | Attending: Gastroenterology | Admitting: Gastroenterology

## 2015-02-22 ENCOUNTER — Ambulatory Visit (INDEPENDENT_AMBULATORY_CARE_PROVIDER_SITE_OTHER): Payer: Medicare Other | Admitting: Gastroenterology

## 2015-02-22 ENCOUNTER — Other Ambulatory Visit: Payer: Self-pay

## 2015-02-22 ENCOUNTER — Encounter: Payer: Self-pay | Admitting: Gastroenterology

## 2015-02-22 VITALS — BP 96/61 | HR 93 | Temp 98.1°F | Ht 61.0 in | Wt 127.0 lb

## 2015-02-22 DIAGNOSIS — I8500 Esophageal varices without bleeding: Secondary | ICD-10-CM | POA: Diagnosis not present

## 2015-02-22 DIAGNOSIS — K7460 Unspecified cirrhosis of liver: Secondary | ICD-10-CM

## 2015-02-22 DIAGNOSIS — R600 Localized edema: Secondary | ICD-10-CM

## 2015-02-22 DIAGNOSIS — T8092XA Unspecified transfusion reaction, initial encounter: Secondary | ICD-10-CM

## 2015-02-22 DIAGNOSIS — K7581 Nonalcoholic steatohepatitis (NASH): Secondary | ICD-10-CM

## 2015-02-22 MED ORDER — LACTULOSE 10 GM/15ML PO SOLN: 10.0000 g | Freq: Two times a day (BID) | ORAL | Status: DC

## 2015-02-22 NOTE — Addendum Note (Signed)
Addended by: Mahala Menghini on: 02/22/2015 03:10 PM   Modules accepted: Orders

## 2015-02-22 NOTE — Patient Instructions (Signed)
1. Upper endoscopy to reevaluate for esophageal varices. See separate instructions.  2. U/S of your left leg to rule out blood clot as scheduled.  3. I will review your most recent labs from dialysis and determine if further liver labs needed. 4. I will discuss Babesia blood contaminant with Infectious Diseases next week and provide you with further information.

## 2015-02-22 NOTE — Progress Notes (Signed)
Primary Care Physician:  Alexander  Primary Gastroenterologist:  Barney Drain, MD   Chief Complaint  Patient presents with  . Follow-up    HPI:  Wanda [name] is a 39 y.o. female here for follow-up of cirrhosis. She is due for an EGD to follow-up on esophageal varices. Last seen in the office in April. Subsequently has been evaluated at [facility] liver transplant clinic earlier this she. Unfortunately she has been noncompliant with her appointments especially with Education officer, museum and psychologist. +UDS for cannabis. She never followed through with her MRI at [facility]. Last liver imaging to my knowledge was November 2015. +ANA and +AMA. Patient has had some lower extremity edema but over the past couple of weeks left lower extremity more swollen than the right. Complains of pain in the left foot. Complains of itching in various spots all over her body. States her skin is dry. Wants a cream for it. Admits to going to the hemodialysis about twice per week instead of 3 times. Has trouble getting a ride on Saturday. Last abdominal tap on 02/12/2015 with removal of 6000 mL. Recently required 2 units of packed red blood cells started by Dr. Hinda Lenis. Recent blood work. States she had blood cultures drawn on 2 use day or Wednesday of this week. Temp 99. Really feels about the same she always has. No significant abdominal pain. Bowel movements about 1-2 times daily. Denies blood in the stool or melena. Denies shortness of breath. Appetite okay. No mental confusion.  We received a phone call from the lab yesterday stating that the patient received 1 unit of blood back in September 2015 which is now on recall. 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     Current Outpatient Prescriptions  Medication Sig Dispense Refill  . folic acid (FOLVITE) 1 MG tablet Take 1 tablet (1 mg total) by mouth daily. 30 tablet 5  . lactulose (CHRONULAC) 10 GM/15ML solution Take 15 mLs  (10 g total) by mouth 2 (two) times daily. 240 mL 0  . loperamide (IMODIUM) 2 MG capsule Take 1 capsule (2 mg total) by mouth 2 (two) times daily as needed for diarrhea or loose stools. 30 capsule 0  . LORazepam (ATIVAN) 1 MG tablet HALF TO 1 TAB PO TWICE DAILY FOR BACK SPASM OR ANXIETY 45 tablet 1  . metolazone (ZAROXOLYN) 5 MG tablet Take 2 tablets (10 mg total) by mouth daily. 60 tablet 3  . midodrine (PROAMATINE) 10 MG tablet Take 1 tablet (10 mg total) by mouth 2 (two) times daily with a meal. Medication to keep your blood pressure from falling. 60 tablet 3  . Oxycodone HCl 10 MG TABS 1 PO TID AS NEEDED FOR PAIN 90 tablet 0  . pantoprazole (PROTONIX) 40 MG tablet Take 1 tablet (40 mg total) by mouth 2 (two) times daily. 60 tablet 2  . promethazine (PHENERGAN) 12.5 MG tablet Take 1 tablet (12.5 mg total) by mouth every 6 (six) hours as needed for nausea or vomiting. 30 tablet 1   No current facility-administered medications for this visit.    Allergies as of 02/22/2015 - Review Complete 02/22/2015  Allergen Reaction Noted  . Lasix [furosemide]  04/12/2014  . Morphine and related Hives and Itching 09/16/2014    Past Medical History  Diagnosis Date  . GERD (gastroesophageal reflux disease)   . 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 c/w steatohepatitis  .  Folate deficiency 09/2013  . Anasarca 10/10/2013  . Hematemesis/vomiting blood 02/24/2014  . Acute blood loss anemia 02/25/2014    Status post transfusion  . Macrocytosis 02/28/2014  . 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 Acute renal failure 09/2013    Pre-renal- resolved  . C. difficile colitis 04/19/2014  . Anxiety   . Depression   . Thrombocytopenia     Hypercellular bone marrow; abundant megakaryocytes per 08/27/2014; s/p bone marrow bx  . Cirrhosis of liver with ascites   . SBP (spontaneous bacterial peritonitis) 11/10/2013  . Bipolar disorder 12/04/2013    2007-SEEN IN  ED FOR INVOLUNTARY COMMITMENT, UDS POS FOR AMPHETAMINES/OPIATES   . PNA (pneumonia) 10/13/2013  . Chronic hypotension   . Gastroesophageal junction ulcer 09/17/2014  . ESRD (end stage renal disease) on dialysis 08/2014    Past Surgical History  Procedure Laterality Date  . None    . Paracentesis  Feb 2015    1180 fluid, negative fluid analysis.   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 Paracentesis  10/2013  . Colonoscopy N/A 12/19/2013    SLF:NO OBVIOUS SOURCE FOR ANEMIA IDETIFIED/ONE COLON POLYP REMOVED/Small internal hemorrhoids  . Esophagogastroduodenoscopy N/A 02/11/2014    Dr. Rourk:Esophageal varices with bleeding stigmata-status post esophageal band ligation therapy. Portal gastropathy  . Esophagogastroduodenoscopy N/A 07/04/2014    RMR: Persiting grade 2 esophageal varicies with bleeding stigmata status post band ligation. Significantly congested gastric mucosa iwith changes constistant with protal gastropathy.   . Esophageal banding  07/04/2014    Procedure: ESOPHAGEAL BANDING;  Surgeon: Daneil Dolin, MD;  Location: [facility] ENDO SUITE;  Service: Endoscopy;;  . Esophagogastroduodenoscopy (egd) with propofol N/A 07/24/2014    SLF:  1. 2 columns grade 2-3 varices- 2 Bands applied.  2.  Moderate gastropathy 3. Duodenal Diverticula  . Esophageal banding N/A 07/24/2014    Procedure: ESOPHAGEAL BANDING (2 bands applied);  Surgeon: Danie Binder, MD;  Location: [facility] ORS;  Service: Endoscopy;  Laterality: N/A;  . Central venous catheter insertion Right   . Esophagogastroduodenoscopy N/A 09/16/2014    Rehman: Single short column of varix proximal to GE junction not large enough to be banded. Two amall ulcers at the GEJ felt to be source of GI Bleeding but no active bleeding but no actibe bleeding noted. No therapy rendered. Portal gastropathy NO evidence of peptic  ulcer diease or gastric varices.   . Esophagogastroduodenoscopy (egd) with propofol N/A 10/26/2014    RMR: 2 columns of grade 2 esophageal varices without obvious bleeding stigmata status post band ligation to complet obliteration of remaining varices.   . Av fistula placement Right 11/16/2014    Procedure: Right arm Creation of arteriovenous fistula;  Surgeon: Angelia Mould, MD;  Location: [facility] OR;  Service: Vascular;  Laterality: Right;  . Esophagogastroduodenoscopy N/A 12/14/2014    SLF: Grade ! esophageal varices. 2. Moderate Portal Gastropathy    Family History  Problem Relation Age of Onset  . Heart disease Mother   . Colon cancer Neg Hx   . Liver disease Neg Hx     History   Social History  . Marital Status: Single    Spouse Name: N/A  . Number of Children: 1  . Years of Education: N/A   Occupational History  . unemployed    Social History Main Topics  . Smoking status: Former Smoker -- 0.25  packs/day for 20 years    Types: Cigarettes    Quit date: 11/05/2008  . Smokeless tobacco: Never Used  . Alcohol Use: No  . Drug Use: No  . Sexual Activity:    Partners: Male    Birth Control/ Protection: None   Other Topics Concern  . Not on file   Social History Narrative      ROS:  General: +fatigue. See hpi.  Eyes: Negative for vision changes.  ENT: Negative for hoarseness, difficulty swallowing , nasal congestion. CV: Negative for chest pain, angina, palpitations, dyspnea on exertion, peripheral edema.  Respiratory: Negative for dyspnea at rest, dyspnea on exertion, cough, sputum, wheezing.  GI: See history of present illness. GU:  Negative for dysuria, hematuria, urinary incontinence, urinary frequency, nocturnal urination.  MS: Negative for joint pain, low back pain.  Derm: Negative for rash. + itching.  Neuro: Negative for weakness, abnormal sensation, seizure, frequent headaches, memory loss, confusion.  Psych: Negative for anxiety, depression, suicidal  ideation, hallucinations.  Endo: Negative for unusual weight change.  Heme: + bruising. no bleeding. Allergy: Negative for rash or hives.    Physical Examination:  BP 96/61 mmHg  Pulse 93  Temp(Src) 98.1 F (36.7 C)  Ht 5\' 1"  (1.549 m)  Wt 127 lb (57.607 kg)  BMI 24.01 kg/m2   General: chronically ill-appearing, WF, NAD.  Head: Normocephalic, atraumatic.   Eyes: Conjunctiva pale, no icterus. Mouth: Oropharyngeal mucosa moist and pink , no lesions erythema or exudate. Neck: Supple without thyromegaly, masses, or lymphadenopathy.  Lungs: Clear to auscultation bilaterally.  Heart: Regular rate and rhythm, no murmurs rubs or gallops.  Abdomen: Bowel sounds are normal, nontender, moderately distended but not tense. +umbilical hernia easily reducible.    Rectal: not performed Extremities: Left-2-3+ pitting edema to mid calf, right 1-2+ pitting edema to mid calf. No clubbing or deformities.  Neuro: Alert and oriented x 4 , grossly normal neurologically.  Skin: Warm and dry, no rash or jaundice.   Psych: Alert and cooperative, normal mood and affect.  Labs: Lab Results  Component Value Date   CREATININE 1.60* 12/04/2014   BUN 36* 12/04/2014   NA 134* 12/04/2014   K 3.1* 12/04/2014   CL 98 12/04/2014   CO2 26 12/04/2014   Lab Results  Component Value Date   WBC 8.7 12/04/2014   HGB 10.5* 12/04/2014   HCT 32.1* 12/04/2014   MCV 95.8 12/04/2014   PLT 144* 12/04/2014   Lab Results  Component Value Date   ALT 17 11/16/2014   AST 54* 11/16/2014   ALKPHOS 88 11/16/2014   BILITOT 1.8* 11/16/2014   Lab Results  Component Value Date   INR 1.17 11/16/2014   INR 1.21 10/25/2014   INR 1.47 09/16/2014     Imaging Studies: US Paracentesis  02/12/2015   CLINICAL DATA:  Cirrhosis, ascites  EXAM: ULTRASOUND GUIDED THERAPEUTIC PARACENTESIS  COMPARISON:  01/25/2015  PROCEDURE: Procedure, benefits, and risks of procedure were discussed with patient.  Written informed consent for  procedure was obtained.  Time out protocol followed.  Adequate collection of ascites localized by ultrasound in RIGHT lower quadrant.  Skin prepped and draped in usual sterile fashion.  Skin and soft tissues anesthetized with 10 mL of 1% lidocaine.  5 [place] Yueh catheter placed into peritoneal cavity.  6000 mL of amber color fluid aspirated by vacuum bottle suction.  Procedure tolerated well by patient without immediate complication.  COMPLICATIONS: None  FINDINGS: As above  IMPRESSION: Successful ultrasound guided paracentesis  yielding 6000 mL of ascites.   Electronically Signed   By: Lavonia Dana M.D.   On: 02/12/2015 15:44   US Paracentesis  01/25/2015   CLINICAL DATA:  Cirrhosis, ascites  EXAM: ULTRASOUND GUIDED THERAPEUTIC PARACENTESIS  COMPARISON:  01/16/2015  PROCEDURE: Procedure, benefits, and risks of procedure were discussed with patient.  Written informed consent for procedure was obtained.  Time out protocol followed.  Adequate collection of ascites localized by ultrasound in RIGHT lower quadrant.  Skin prepped and draped in usual sterile fashion.  Skin and soft tissues anesthetized with 10 mL of 1% lidocaine.  5 [place] Yueh catheter placed into peritoneal cavity.  5650 mL of amber color fluid aspirated by vacuum bottle suction.  Procedure tolerated well by patient without immediate complication.  COMPLICATIONS: None  FINDINGS: As above  IMPRESSION: Successful ultrasound guided paracentesis yielding 5650 ml of ascites.   Electronically Signed   By: Lavonia Dana M.D.   On: 01/25/2015 12:15

## 2015-02-22 NOTE — Telephone Encounter (Signed)
SPOKE WITH DR. Megan Salon. PT NEEDS CBC WITH PERIPHERAL SMEAR REVIEWED BY PATHOLOGY.

## 2015-02-22 NOTE — Progress Notes (Signed)
Quick Note:  Please let patient know her doppler was NEGATIVE for DVT.  Regarding her itching, I suggest Aveeno Daily Moisturizing Lotion all over. Continue Benadryl cream for highly pruritic spots.   I reviewed labs from [facility]: 02/19/2015, hemoglobin 9.4, potassium 3.5. 02/14/2015, ferritin 259, iron 55, iron saturations 22%, TIBC 246, alkaline phosphatase 50, albumen 2.9, creatinine 3.6, white blood cell count 7100, AST 46, ALT 19.   LABS we need to do, we really need her to have done at [facility] so we can get special lab requested by ID done properly. -CBC with peripheral smear reviewed by pathology (exposure to Babesia from transfusion from infected donor).  -PT/INR -LFTs -Met-7   ______

## 2015-02-22 NOTE — Assessment & Plan Note (Signed)
Overall has been stable the past few months. He noncompliant with hemodialysis, averaging 2 times per week. Unfortunately she's missed or rescheduled more than 5 appointments at [facility] with the social worker and/or psychologist. Never had her MRI done either. Urine drug screen positive for cannabis therefore she needs to go to the steps to be considered for transplantation. There was mention of sending her for appointment at renal transplant clinic as well.  She presents today for follow-up. She is due for EGD for surveillance of esophageal varices. She is ready to schedule this. Augment conscious sedation with Phenergan 12.5 mg IV 30 minutes before the procedure. I have discussed the risks, alternatives, benefits with regards to but not limited to the risk of reaction to medication, bleeding, infection, perforation and the patient is agreeable to proceed. Written consent to be obtained.  Patient has been averaging about 2 abdominal paracenteses per month. Having issues with some lower extremity edema. Left lower extremity more swollen than the right for couple of weeks. Will send her for ultrasound to rule out DVT today.  Patient has had recent labs with dialysis would like for [place] to obtain those for review.  Received phone call from the lab stating that one of the units of blood she received back in September 2015 was on recall. The donor had donated additional unit in June 2016 which tested positive for Babesia. Per ID, CBC with peripheral smear needed as next step.   Will need to obtain liver imaging in near future for hepatoma surveillance. To discuss with Dr. Oneida Alar, since patient is not candidate for MRI WITH contrast due to kidney failure.

## 2015-02-22 NOTE — Telephone Encounter (Signed)
Doris, I placed a bunch of orders needed on Wanda [name].  I put in order for CBC with diff/save smear/patholoy review. Please check behind me and make sure this was entered correctly. You may have to call the lab.

## 2015-02-22 NOTE — Telephone Encounter (Signed)
PT has appt here today with Neil Crouch PA at 11:00 AM.

## 2015-02-25 ENCOUNTER — Other Ambulatory Visit: Payer: Self-pay

## 2015-02-25 ENCOUNTER — Encounter: Payer: Self-pay | Admitting: Vascular Surgery

## 2015-02-25 ENCOUNTER — Telehealth: Payer: Self-pay

## 2015-02-25 ENCOUNTER — Telehealth: Payer: Self-pay | Admitting: Gastroenterology

## 2015-02-25 DIAGNOSIS — I8500 Esophageal varices without bleeding: Secondary | ICD-10-CM

## 2015-02-25 NOTE — Telephone Encounter (Signed)
Discussed with both Doris and Ginger.  Patient told me she had diffuse itching when I saw her. She did not show me a rash associated with her dialysis access.   Suggested Aveeno Daily Moisturizing Lotion for diffuse itching. But dialysis sees her regularly and could evaluate any rash at access point.

## 2015-02-25 NOTE — Telephone Encounter (Signed)
LMOM for a return call.  

## 2015-02-25 NOTE — Telephone Encounter (Signed)
error 

## 2015-02-25 NOTE — Telephone Encounter (Signed)
Talked to [name](boyfriend) He was asking for some cream to go on the rash around her port-a-cath. I told him that we could not give her and cream for a rash and that she might need to talk to Dr. Lowanda Foster.

## 2015-02-25 NOTE — Telephone Encounter (Signed)
Patient was seen by LSL on Friday and her boyfriend called today to say that they got 2 prescriptions Friday evening but the prescription for the cream wasn't called in for her rash around the portacath. She uses [facility] in [place]. Her boyfriend is at work and said he would call back during his lunch around 1pm to follow up on it. [phone] or [phone]

## 2015-02-25 NOTE — Addendum Note (Signed)
Addended by: Mahala Menghini on: 02/25/2015 01:05 PM   Modules accepted: Orders

## 2015-02-25 NOTE — Telephone Encounter (Signed)
Called pt and did not get an answer. Unable to leave voicemail.  Called patients boyfriend [name] and [facility] regarding pt EGD procedure. Mailed instructions

## 2015-02-25 NOTE — Telephone Encounter (Signed)
Lab orders have been faxed. See result note.

## 2015-02-25 NOTE — Telephone Encounter (Signed)
Routing to Neil Crouch, PA who saw pt on Friday.

## 2015-02-25 NOTE — Progress Notes (Signed)
cc'ed to pcp °

## 2015-02-25 NOTE — Progress Notes (Signed)
Quick Note:  LMOM for a return call. Lab orders have been faxed. ______

## 2015-02-25 NOTE — Addendum Note (Signed)
Addended by: Mahala Menghini on: 02/25/2015 01:04 PM   Modules accepted: Orders

## 2015-02-25 NOTE — Telephone Encounter (Signed)
Tried both phone numbers. The first I could not leave a VM. LMOM @ [phone] for a return call. Please see results note if they return call.

## 2015-02-26 ENCOUNTER — Other Ambulatory Visit: Payer: Self-pay | Admitting: Vascular Surgery

## 2015-02-26 DIAGNOSIS — N186 End stage renal disease: Secondary | ICD-10-CM

## 2015-02-26 DIAGNOSIS — Z4931 Encounter for adequacy testing for hemodialysis: Secondary | ICD-10-CM

## 2015-02-26 NOTE — Progress Notes (Signed)
Quick Note:  Pt is aware and will go to the lab when she can. ______

## 2015-02-26 NOTE — Telephone Encounter (Signed)
Pt is aware.  

## 2015-02-27 ENCOUNTER — Encounter: Payer: Self-pay | Admitting: Vascular Surgery

## 2015-02-27 ENCOUNTER — Ambulatory Visit (INDEPENDENT_AMBULATORY_CARE_PROVIDER_SITE_OTHER): Payer: Self-pay | Admitting: Vascular Surgery

## 2015-02-27 ENCOUNTER — Ambulatory Visit (HOSPITAL_COMMUNITY)
Admission: RE | Admit: 2015-02-27 | Discharge: 2015-02-27 | Disposition: A | Discharge status: Home / Self Care | Payer: Medicare Other | Source: Ambulatory Visit | Attending: Vascular Surgery | Admitting: Vascular Surgery

## 2015-02-27 VITALS — BP 90/60 | HR 85 | Ht 61.0 in | Wt 160.0 lb

## 2015-02-27 DIAGNOSIS — Z992 Dependence on renal dialysis: Secondary | ICD-10-CM

## 2015-02-27 DIAGNOSIS — N186 End stage renal disease: Secondary | ICD-10-CM

## 2015-02-27 DIAGNOSIS — Z4931 Encounter for adequacy testing for hemodialysis: Secondary | ICD-10-CM | POA: Insufficient documentation

## 2015-02-27 NOTE — Progress Notes (Signed)
  POST OPERATIVE OFFICE NOTE    CC:  F/u for surgery  HPI:  This is a 39 y.o. female who is s/p Right BC fistula 11/16/2014.  She reports no numbness, motor loss or weakness in the right UE.  No change in her health since surgery.  No fever, chills or CP.  Allergies  Allergen Reactions  . Lasix [Furosemide]     "doesn't work"  . Morphine And Related Hives and Itching    Current Outpatient Prescriptions  Medication Sig Dispense Refill  . folic acid (FOLVITE) 1 MG tablet Take 1 tablet (1 mg total) by mouth daily. 30 tablet 5  . lactulose (CHRONULAC) 10 GM/15ML solution Take 15 mLs (10 g total) by mouth 2 (two) times daily. 946 mL 3  . loperamide (IMODIUM) 2 MG capsule Take 1 capsule (2 mg total) by mouth 2 (two) times daily as needed for diarrhea or loose stools. 30 capsule 0  . LORazepam (ATIVAN) 1 MG tablet HALF TO 1 TAB PO TWICE DAILY FOR BACK SPASM OR ANXIETY 45 tablet 1  . metolazone (ZAROXOLYN) 5 MG tablet Take 2 tablets (10 mg total) by mouth daily. 60 tablet 3  . midodrine (PROAMATINE) 10 MG tablet Take 1 tablet (10 mg total) by mouth 2 (two) times daily with a meal. Medication to keep your blood pressure from falling. 60 tablet 3  . Oxycodone HCl 10 MG TABS 1 PO TID AS NEEDED FOR PAIN 90 tablet 0  . pantoprazole (PROTONIX) 40 MG tablet Take 1 tablet (40 mg total) by mouth 2 (two) times daily. 60 tablet 2  . promethazine (PHENERGAN) 12.5 MG tablet Take 1 tablet (12.5 mg total) by mouth every 6 (six) hours as needed for nausea or vomiting. 30 tablet 1   No current facility-administered medications for this visit.     ROS:  See HPI  Physical Exam:  Filed Vitals:   02/27/15 1537  BP: 90/60  Pulse: 85    Incision:  Well Healed Extremities:  Mature palpable thrill right AV fistula Neuro: sensation intact  Assessment/Plan:  This is a 39 y.o. female who is s/p: Right BC Fistula.  She is currently on Dialysis via right IJ catheter.  At this time they may start sticking the  fistula fo dialysis.  She will follow up PRN in the future.   Theda Sers, Jasamine Pottinger MAUREEN PA-C [facility]   Clinic MD:  Pt seen and examined with Dr. Scot Dock

## 2015-03-01 ENCOUNTER — Ambulatory Visit (HOSPITAL_COMMUNITY)
Admission: RE | Admit: 2015-03-01 | Discharge: 2015-03-01 | Disposition: A | Discharge status: Home / Self Care | Payer: Medicare Other | Source: Ambulatory Visit | Attending: Gastroenterology | Admitting: Gastroenterology

## 2015-03-01 ENCOUNTER — Telehealth: Payer: Self-pay | Admitting: Gastroenterology

## 2015-03-01 DIAGNOSIS — R188 Other ascites: Secondary | ICD-10-CM | POA: Insufficient documentation

## 2015-03-01 DIAGNOSIS — K7460 Unspecified cirrhosis of liver: Secondary | ICD-10-CM | POA: Diagnosis not present

## 2015-03-01 MED ORDER — ALBUMIN HUMAN 25 % IV SOLN
50.0000 g | Freq: Once | INTRAVENOUS | Status: AC
  Administered 2015-03-01: 50 g via INTRAVENOUS

## 2015-03-01 NOTE — Telephone Encounter (Signed)
Pt's boyfriend called to say that patient is going to dialysis this morning and wants to know if she can go afterwards to have fluid drawn off of her. Please advise and call [phone]

## 2015-03-01 NOTE — Procedures (Signed)
PreOperative Dx: Cirrhosis, ascites Postoperative Dx: Cirrhosis, ascites Procedure:   US guided paracentesis Radiologist:  Thornton Papas Anesthesia:  10 ml of 1% lidocaine Specimen:  8450 ml of yellow ascitic fluid EBL:   < 1 ml Complications: None

## 2015-03-01 NOTE — Telephone Encounter (Signed)
Per Izora Gala she confirmed the patient has a standing order for a para.  I also spoke with Ms. Hogrefe's boyfriend and he stated she could be at [facility] for a 4:00 para appt.

## 2015-03-01 NOTE — Telephone Encounter (Signed)
According to radiology she will need to be there for a 300 appt.  Per the boyfriend they will be there at 300

## 2015-03-11 ENCOUNTER — Ambulatory Visit (HOSPITAL_COMMUNITY)
Admission: RE | Admit: 2015-03-11 | Discharge: 2015-03-11 | Disposition: A | Discharge status: Home / Self Care | Payer: Medicare Other | Source: Ambulatory Visit | Attending: Gastroenterology | Admitting: Gastroenterology

## 2015-03-11 ENCOUNTER — Encounter (HOSPITAL_COMMUNITY): Payer: Self-pay

## 2015-03-11 DIAGNOSIS — K7460 Unspecified cirrhosis of liver: Secondary | ICD-10-CM | POA: Insufficient documentation

## 2015-03-11 DIAGNOSIS — R188 Other ascites: Secondary | ICD-10-CM

## 2015-03-11 MED ORDER — ALBUMIN HUMAN 25 % IV SOLN
25.0000 g | Freq: Once | INTRAVENOUS | Status: AC
  Administered 2015-03-11: 25 g via INTRAVENOUS

## 2015-03-11 MED ORDER — ALBUMIN HUMAN 25 % IV SOLN
INTRAVENOUS | Status: AC
  Filled 2015-03-11: qty 100

## 2015-03-11 NOTE — Progress Notes (Signed)
Paracentesis complete no signs of distress. 5300 ml amber colored ascites removed.

## 2015-03-11 NOTE — Procedures (Signed)
PreOperative Dx: Cirrhosis, ascites Postoperative Dx: Cirrhosis, ascites Procedure:   US guided paracentesis Radiologist:  Thornton Papas Anesthesia:  10 ml of 1% lidocaine Specimen:  5300 ml of yellow ascitic fluid EBL:   < 1 ml Complications: None

## 2015-03-13 ENCOUNTER — Other Ambulatory Visit: Payer: Self-pay | Admitting: Gastroenterology

## 2015-03-13 NOTE — Telephone Encounter (Signed)
Patient called this morning to speak with [facility] nurse. She had some questions she wanted to ask. Please call her back at [phone]

## 2015-03-13 NOTE — Telephone Encounter (Signed)
LMOM for a return call.  

## 2015-03-13 NOTE — Telephone Encounter (Signed)
Pt called office this morning wanting to know if she could have a refill on her pain medication. States it is the Oxycodone that she needs

## 2015-03-14 MED ORDER — OXYCODONE HCL 10 MG PO TABS: ORAL_TABLET | ORAL | Status: DC

## 2015-03-14 NOTE — Telephone Encounter (Signed)
I called pt on her emergency contact and informed her to go by [facility] to pick up the prescription.

## 2015-03-14 NOTE — Telephone Encounter (Signed)
Called patient TO DISCUSS CONCERNS. NO VOICE MAILBOX. SHE MAY PICK UP RX AT [facility]ORT STAY RECEPTION TODAY.

## 2015-03-16 IMAGING — US US RENAL
1 series · 14 of 25 positions shown · non-contrast
Comparison: No prior urinary tract ultrasound. CT abdomen and
pelvis 09/11/2013.

CLINICAL DATA: 38-year-old with hepatic cirrhosis and portal
hypertension, presenting with acute renal failure.

EXAM:
RENAL/URINARY TRACT ULTRASOUND COMPLETE

[Series 1: us renal · 0.14mm/px · 14 of 53 slices shown]
[im 1/53]
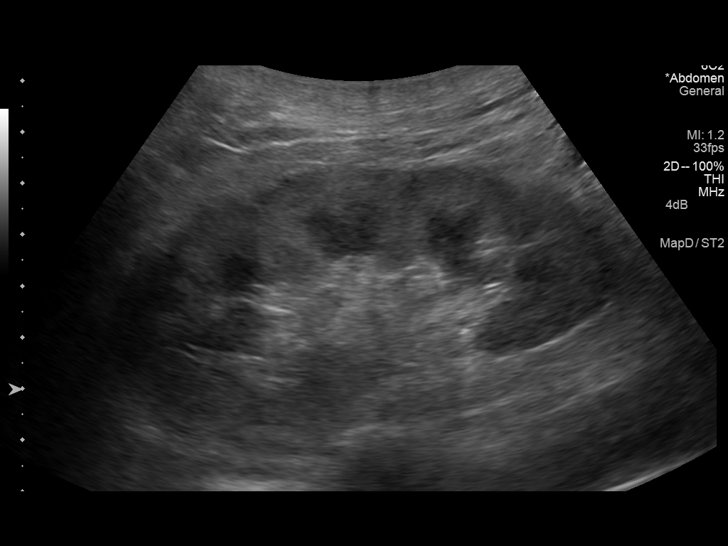
[im 5/53]
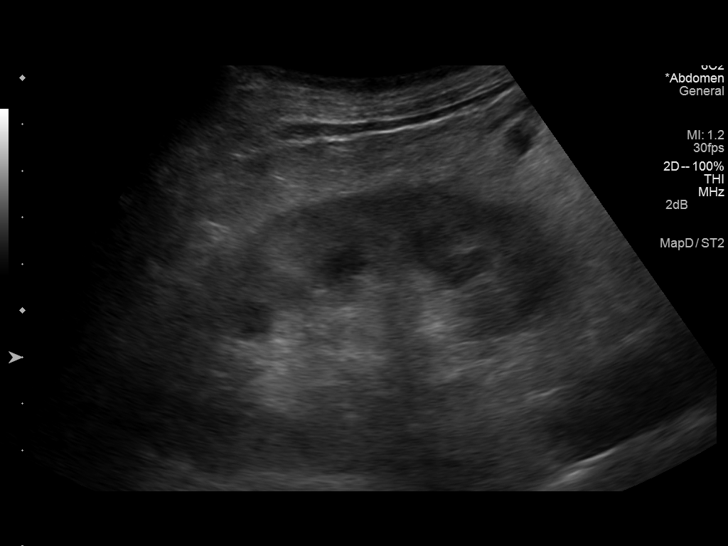
[im 9/53]
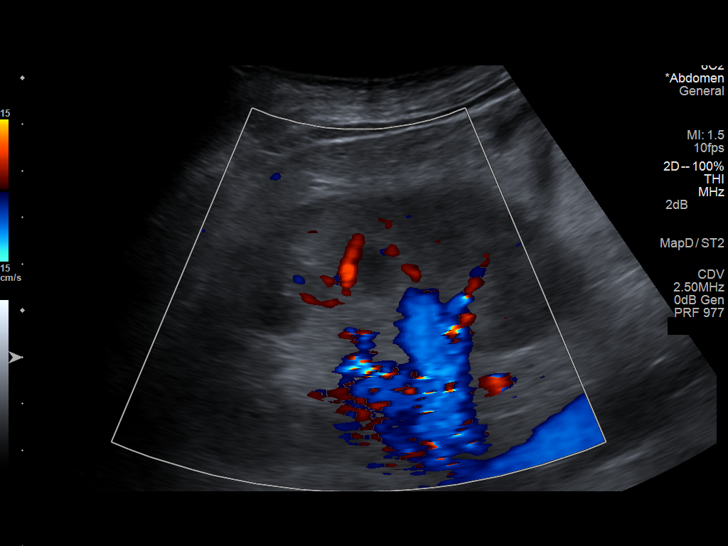
[im 14/53]
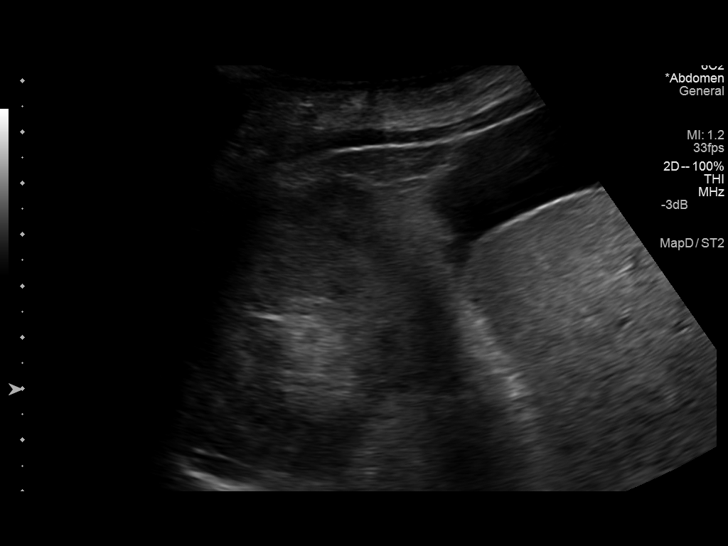
[im 18/53]
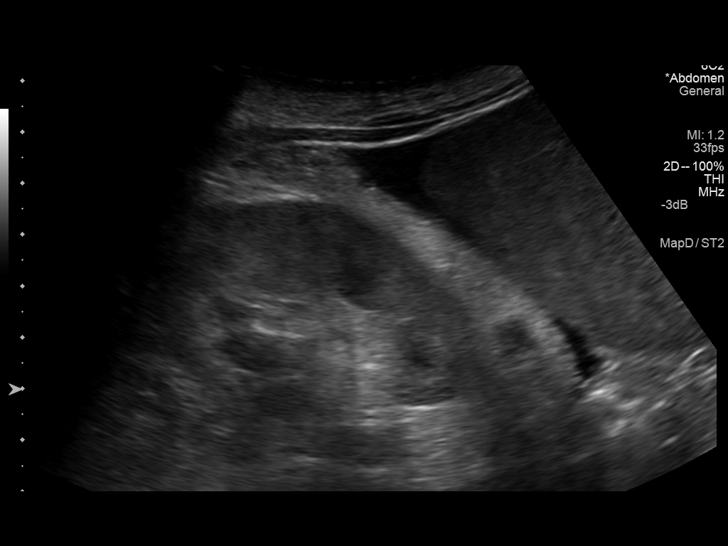
[im 20/53]
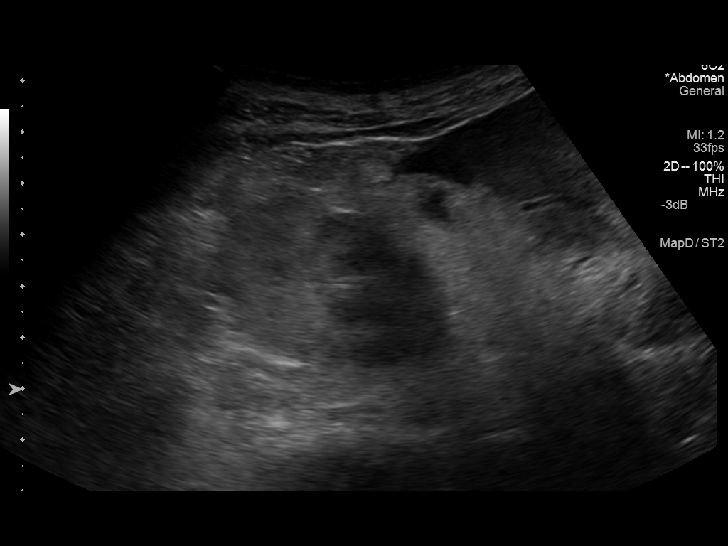
[im 24/53]
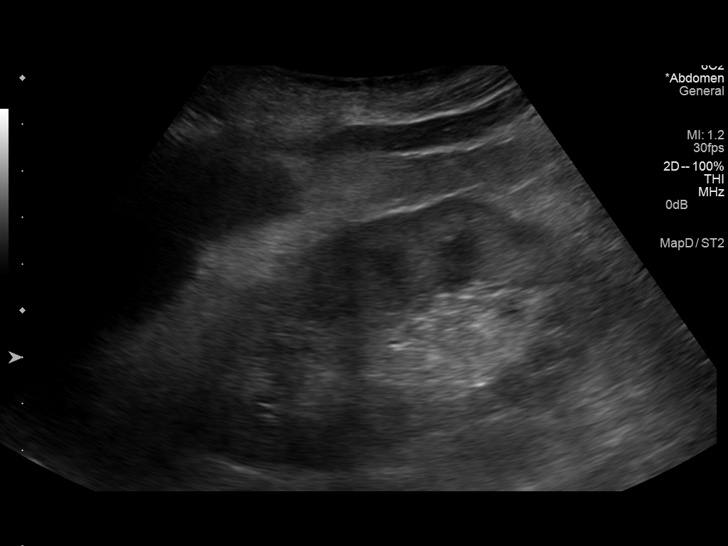
[im 29/53]
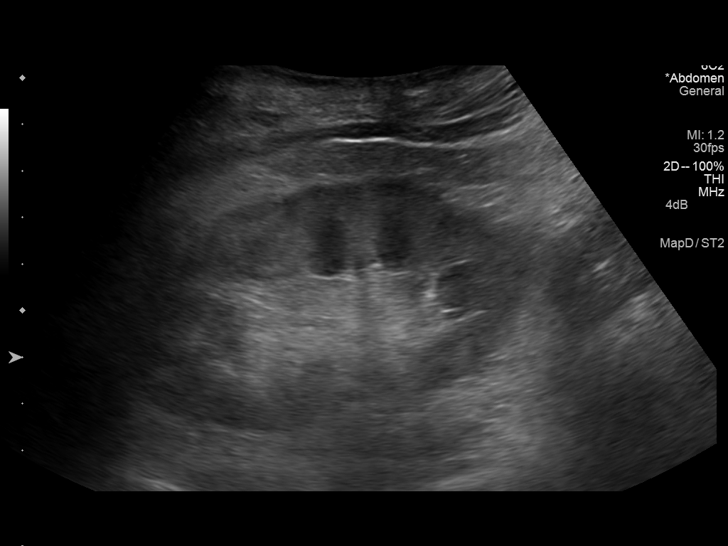
[im 33/53]
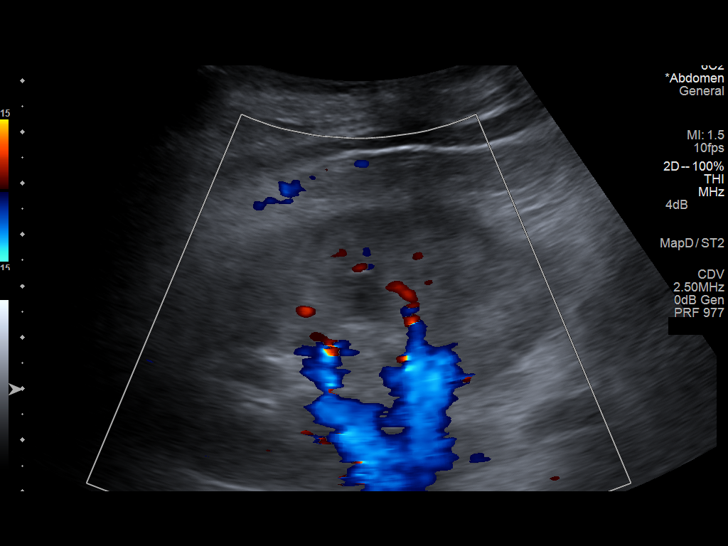
[im 35/53]
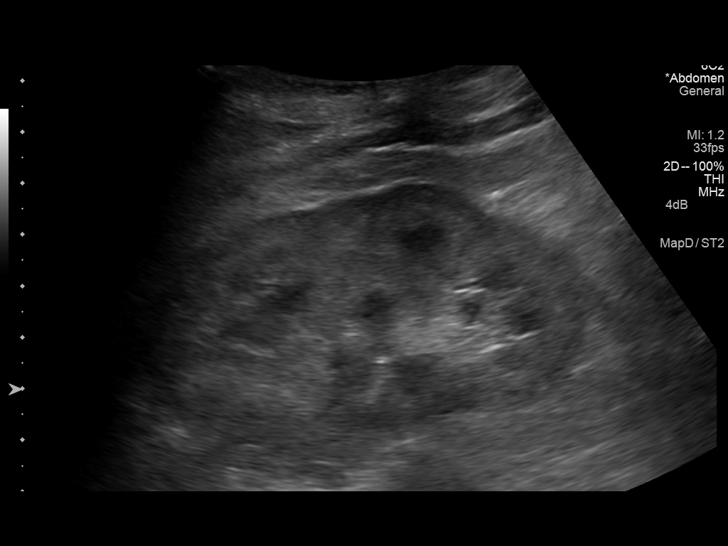
[im 40/53]
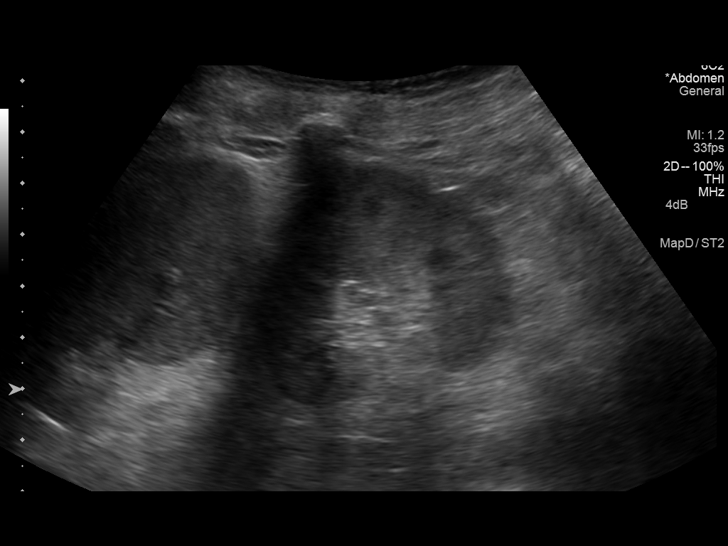
[im 44/53]
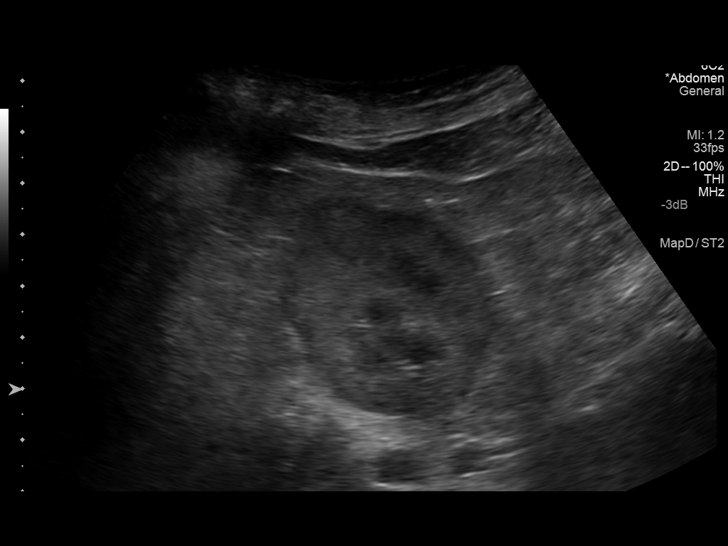
[im 48/53]
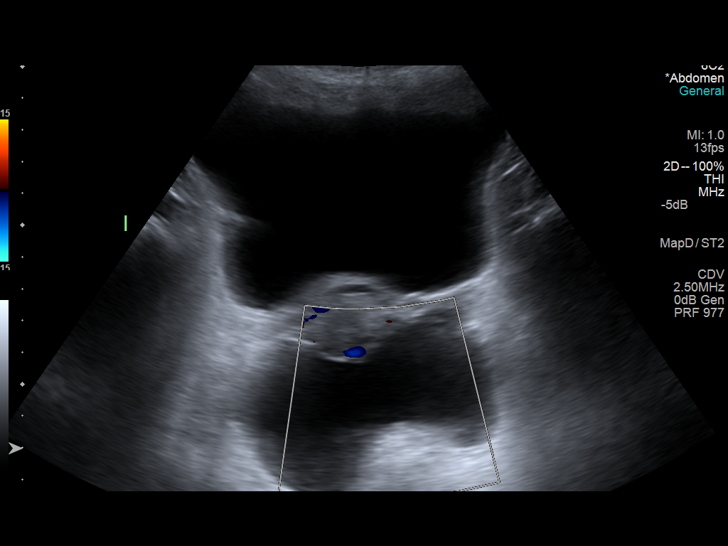
[im 53/53]
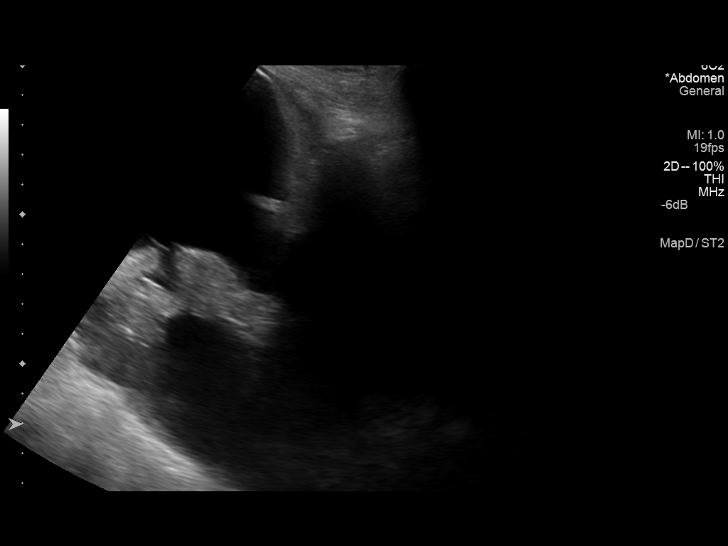

[14 of 25 positions shown; findings below may reference images not displayed]

FINDINGS: Right Kidney:

Length: Approximately 11.0 cm. Echogenic parenchyma. Well-preserved
cortex. No focal parenchymal abnormality. No hydronephrosis.

Left Kidney:

Length: Approximately 10.1 cm. Echogenic parenchyma. Well-preserved
cortex. No focal parenchymal abnormality. No hydronephrosis.

Bladder:

Likely decompressed, as it was not clearly visualized in the pelvis.

Other findings:

Large amount of pelvic ascites.
IMPRESSION: 1. No evidence of hydronephrosis involving either kidney.
2. Echogenic renal parenchyma bilaterally consistent with medical
renal disease.
3. Large amount of pelvic ascites.

## 2015-03-18 IMAGING — US US PARACENTESIS
1 series · 4 of 4 positions shown · non-contrast
Comparison: 08/14/2014 renal ultrasound

CLINICAL DATA: Cirrhosis due to NASH, ascites

EXAM:
ULTRASOUND GUIDED DIAGNOSTIC AND THERAPEUTIC PARACENTESIS

[Series 1: us paracentesis · 0.23mm/px · 4 of 4 slices shown]
[im 1/4]
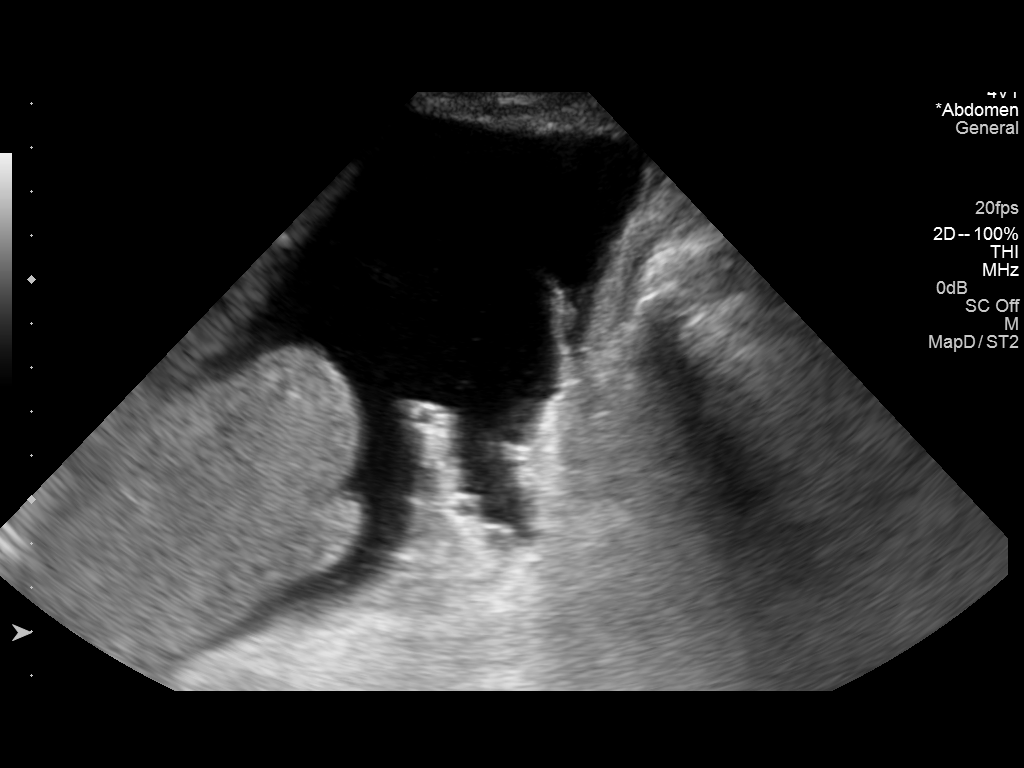
[im 2/4]
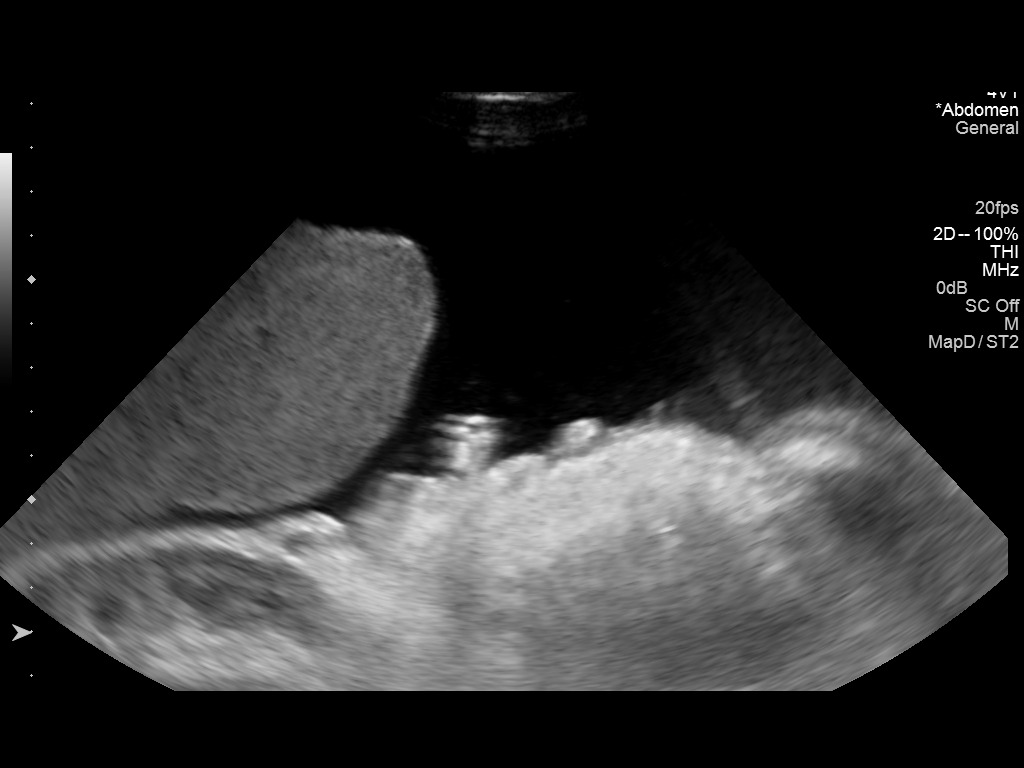
[im 3/4]
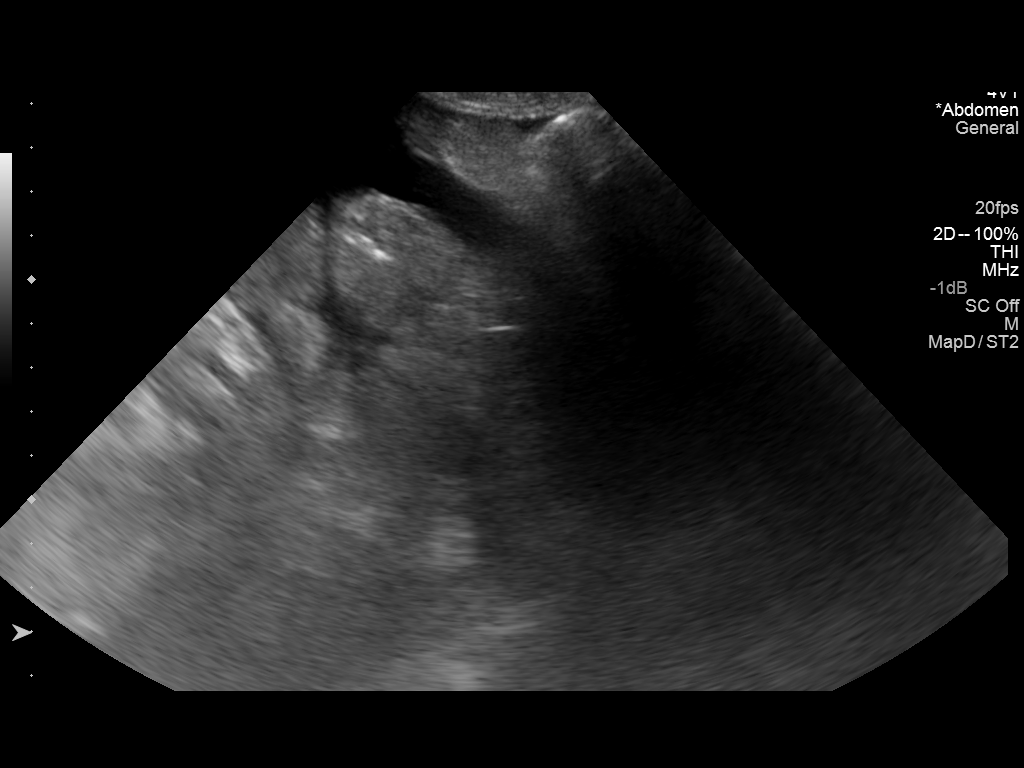
[im 4/4]
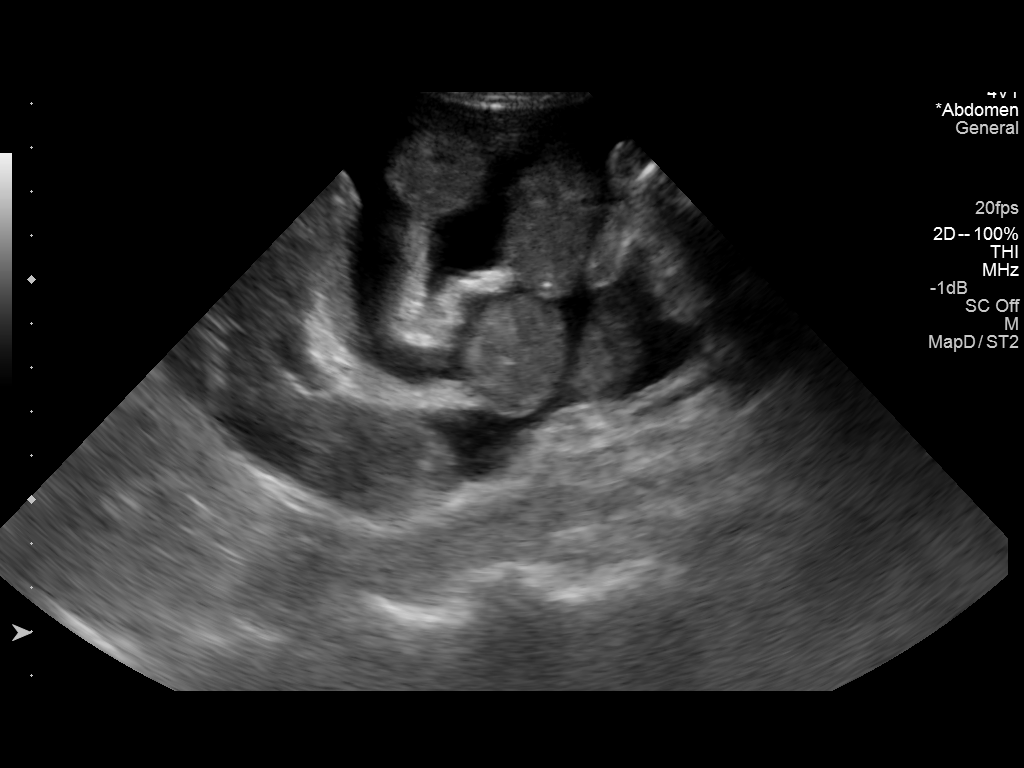

[4 of 4 positions shown; findings below may reference images not displayed]

PROCEDURE:
Procedure, benefits, and risks of procedure were discussed with
patient.

Written informed consent for procedure was obtained.

Time out protocol followed.

Adequate collection of ascites localized by ultrasound in RIGHT
lower quadrant.

Skin prepped and draped in usual sterile fashion.

Skin and soft tissues anesthetized with 10 mL of 1% lidocaine.

5 [place] Yueh catheter placed into peritoneal cavity.

7777 mL of yellow fluid aspirated by vacuum bottle suction.

Volume of ascites removed was limited to 1 L at request of referring
physician due to low blood pressure.

Procedure tolerated well by patient without immediate complication.
FINDINGS: A total of approximately 7777 mL of ascitic fluid was removed. A
fluid sample of 180 mL was sent for laboratory analysis.
IMPRESSION: Successful ultrasound guided paracentesis yielding 7777 mL of
ascites.

## 2015-03-19 ENCOUNTER — Ambulatory Visit (HOSPITAL_COMMUNITY)
Admission: RE | Admit: 2015-03-19 | Discharge: 2015-03-19 | Disposition: A | Discharge status: Home / Self Care | Payer: Medicare Other | Source: Ambulatory Visit | Attending: Gastroenterology | Admitting: Gastroenterology

## 2015-03-19 ENCOUNTER — Other Ambulatory Visit: Payer: Self-pay | Admitting: Gastroenterology

## 2015-03-19 DIAGNOSIS — R188 Other ascites: Secondary | ICD-10-CM | POA: Insufficient documentation

## 2015-03-19 DIAGNOSIS — K7460 Unspecified cirrhosis of liver: Secondary | ICD-10-CM | POA: Insufficient documentation

## 2015-03-19 NOTE — Procedures (Signed)
PreOperative Dx: Cirrhosis, ascites Postoperative Dx: Cirrhosis, ascites Procedure:   US guided paracentesis Radiologist:  Thornton Papas Anesthesia:  10 ml of 1% lidocaine Specimen:  4600 ml of amber colored ascitic fluid EBL:   < 1 ml Complications: None

## 2015-03-19 NOTE — Progress Notes (Signed)
Paracentesis complete no signs of distress. 4600 ml amber colored ascites removed.

## 2015-03-20 IMAGING — US IR US GUIDE VASC ACCESS RIGHT
1 series · 1 of 1 positions shown · non-contrast
Comparison: none

INDICATION: End-stage renal disease. In need of intravenous access for the
initiation of dialysis.

[Series 1: ir fluoro guide cv line*right* · 1 of 1 slices shown]
[im 1/1]
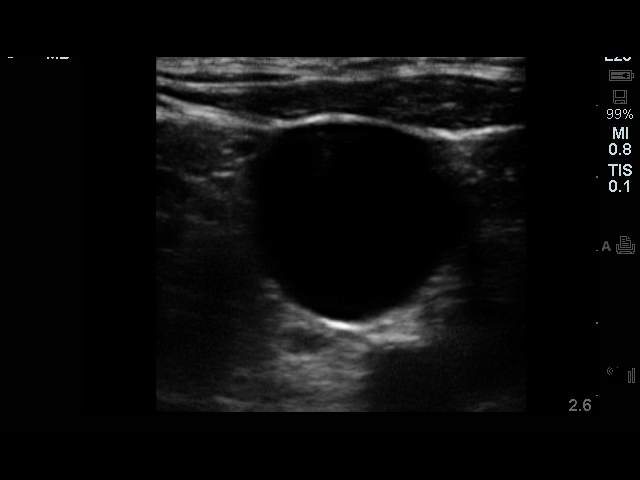

[1 of 1 positions shown; findings below may reference images not displayed]

EXAM:
TUNNELED CENTRAL VENOUS HEMODIALYSIS CATHETER PLACEMENT WITH
ULTRASOUND AND FLUOROSCOPIC GUIDANCE

MEDICATIONS:
Ancef 2 gm IV; The IV antibiotic was given in an appropriate time
interval prior to skin puncture.

CONTRAST:  None

ANESTHESIA/SEDATION:
Versed 1 mg IV; Fentanyl 50 mcg IV

Total Moderate Sedation Time

15 minutes.

FLUOROSCOPY TIME:  24 seconds (4.1 mGy)

COMPLICATIONS:
None immediate



After creating a small venotomy incision, a micropuncture kit was
utilized to access the right internal jugular vein under direct,
real-time ultrasound guidance after the overlying soft tissues were
anesthetized with 1% lidocaine with epinephrine. Ultrasound image
documentation was performed. The microwire was kinked to measure
appropriate catheter length. A stiff Glidewire was advanced to the
level of the IVC and the micropuncture sheath was exchanged for a
peel-away sheath. A hemosplit tunneled hemodialysis catheter
measuring 19 cm from tip to cuff was tunneled in a retrograde
fashion from the anterior chest wall to the venotomy incision.

The catheter was then placed through the peel-away sheath with tips
ultimately positioned within the superior aspect of the right
atrium. Final catheter positioning was confirmed and documented with
a spot radiographic image. The catheter aspirates and flushes
normally. The catheter was flushed with appropriate volume heparin
dwells.

The catheter exit site was secured with a 0-Prolene retention
suture. The venotomy incision was closed with an interrupted 4-0
Vicryl, Dermabond and Tiger. Dressings were applied. The
patient tolerated the procedure well without immediate post
procedural complication.
IMPRESSION: Successful placement of 19 cm tip to cuff tunneled hemodialysis
catheter via the right internal jugular vein with tips terminating
within the superior aspect of the right atrium. The catheter is
ready for immediate use.

## 2015-03-27 ENCOUNTER — Encounter (HOSPITAL_COMMUNITY): Payer: Self-pay | Admitting: *Deleted

## 2015-03-27 ENCOUNTER — Ambulatory Visit (HOSPITAL_COMMUNITY)
Admission: RE | Admit: 2015-03-27 | Discharge: 2015-03-27 | Disposition: A | Discharge status: Home / Self Care | Payer: Medicare Other | Source: Ambulatory Visit | Attending: Gastroenterology | Admitting: Gastroenterology

## 2015-03-27 ENCOUNTER — Encounter (HOSPITAL_COMMUNITY)
Admission: RE | Disposition: A | Discharge status: Home / Self Care | Payer: Self-pay | Source: Ambulatory Visit | Attending: Gastroenterology

## 2015-03-27 DIAGNOSIS — Z992 Dependence on renal dialysis: Secondary | ICD-10-CM | POA: Diagnosis not present

## 2015-03-27 DIAGNOSIS — N186 End stage renal disease: Secondary | ICD-10-CM | POA: Insufficient documentation

## 2015-03-27 DIAGNOSIS — Z79899 Other long term (current) drug therapy: Secondary | ICD-10-CM | POA: Insufficient documentation

## 2015-03-27 DIAGNOSIS — I8500 Esophageal varices without bleeding: Secondary | ICD-10-CM | POA: Diagnosis not present

## 2015-03-27 DIAGNOSIS — R188 Other ascites: Secondary | ICD-10-CM

## 2015-03-27 DIAGNOSIS — K766 Portal hypertension: Secondary | ICD-10-CM | POA: Diagnosis not present

## 2015-03-27 DIAGNOSIS — K219 Gastro-esophageal reflux disease without esophagitis: Secondary | ICD-10-CM | POA: Insufficient documentation

## 2015-03-27 DIAGNOSIS — D696 Thrombocytopenia, unspecified: Secondary | ICD-10-CM | POA: Diagnosis not present

## 2015-03-27 DIAGNOSIS — F418 Other specified anxiety disorders: Secondary | ICD-10-CM | POA: Diagnosis not present

## 2015-03-27 DIAGNOSIS — K7460 Unspecified cirrhosis of liver: Secondary | ICD-10-CM | POA: Diagnosis present

## 2015-03-27 DIAGNOSIS — Z87891 Personal history of nicotine dependence: Secondary | ICD-10-CM | POA: Insufficient documentation

## 2015-03-27 DIAGNOSIS — K3189 Other diseases of stomach and duodenum: Secondary | ICD-10-CM | POA: Diagnosis not present

## 2015-03-27 HISTORY — PX: ESOPHAGOGASTRODUODENOSCOPY: SHX5428

## 2015-03-27 SURGERY — EGD (ESOPHAGOGASTRODUODENOSCOPY)
Anesthesia: Moderate Sedation

## 2015-03-27 MED ORDER — FENTANYL CITRATE (PF) 100 MCG/2ML IJ SOLN
INTRAMUSCULAR | Status: DC | PRN
  Administered 2015-03-27 (×2): 25 ug via INTRAVENOUS
  Administered 2015-03-27: 50 ug via INTRAVENOUS

## 2015-03-27 MED ORDER — FENTANYL CITRATE (PF) 100 MCG/2ML IJ SOLN
INTRAMUSCULAR | Status: AC
  Filled 2015-03-27: qty 4

## 2015-03-27 MED ORDER — SODIUM CHLORIDE 0.9 % IJ SOLN
INTRAMUSCULAR | Status: AC
  Filled 2015-03-27: qty 3

## 2015-03-27 MED ORDER — MIDAZOLAM HCL 5 MG/5ML IJ SOLN
INTRAMUSCULAR | Status: AC
  Filled 2015-03-27: qty 10

## 2015-03-27 MED ORDER — PROMETHAZINE HCL 25 MG/ML IJ SOLN
12.5000 mg | Freq: Once | INTRAMUSCULAR | Status: AC
  Administered 2015-03-27: 12.5 mg via INTRAVENOUS

## 2015-03-27 MED ORDER — LIDOCAINE VISCOUS 2 % MT SOLN
OROMUCOSAL | Status: AC
  Filled 2015-03-27: qty 15

## 2015-03-27 MED ORDER — PROMETHAZINE HCL 25 MG/ML IJ SOLN
INTRAMUSCULAR | Status: AC
  Filled 2015-03-27: qty 1

## 2015-03-27 MED ORDER — MIDAZOLAM HCL 5 MG/5ML IJ SOLN
INTRAMUSCULAR | Status: DC | PRN
  Administered 2015-03-27: 1 mg via INTRAVENOUS
  Administered 2015-03-27 (×3): 2 mg via INTRAVENOUS
  Administered 2015-03-27: 1 mg via INTRAVENOUS

## 2015-03-27 MED ORDER — SODIUM CHLORIDE 0.9 % IV SOLN
INTRAVENOUS | Status: DC
  Administered 2015-03-27: 11:00:00 via INTRAVENOUS

## 2015-03-27 NOTE — Discharge Instructions (Signed)
You have esophageal varices DUE TO SCARRING IN YOUR LIVER.     CONTINUE PROTONIX, LACTULOSE, AND WATER PILLS.  FOLLOW UP IN DEC 2016.  REPEAT EGD IN JAN 2017.    UPPER ENDOSCOPY AFTER CARE Read the instructions outlined below and refer to this sheet in the next week. These discharge instructions provide you with general information on caring for yourself after you leave the hospital. While your treatment has been planned according to the most current medical practices available, unavoidable complications occasionally occur. If you have any problems or questions after discharge, call DR. Nirvaan Frett, [phone].  ACTIVITY  You may resume your regular activity, but move at a slower pace for the next 24 hours.   Take frequent rest periods for the next 24 hours.   Walking will help get rid of the air and reduce the bloated feeling in your belly (abdomen).   No driving for 24 hours (because of the medicine (anesthesia) used during the test).   You may shower.   Do not sign any important legal documents or operate any machinery for 24 hours (because of the anesthesia used during the test).    NUTRITION  Drink plenty of fluids.   You may resume your normal diet as instructed by your doctor.   Begin with a light meal and progress to your normal diet. Heavy or fried foods are harder to digest and may make you feel sick to your stomach (nauseated).   Avoid alcoholic beverages for 24 hours or as instructed.    MEDICATIONS  You may resume your normal medications.   WHAT YOU CAN EXPECT TODAY  Some feelings of bloating in the abdomen.   Passage of more gas than usual.    IF YOU HAD A BIOPSY TAKEN DURING THE UPPER ENDOSCOPY:  Eat a soft diet IF YOU HAVE NAUSEA, BLOATING, ABDOMINAL PAIN, OR VOMITING.    FINDING OUT THE RESULTS OF YOUR TEST Not all test results are available during your visit. DR. Oneida Alar WILL CALL YOU WITHIN 7 DAYS OF YOUR PROCEDUE WITH YOUR RESULTS. Do not  assume everything is normal if you have not heard from DR. Arliss Hepburn IN ONE WEEK, CALL HER OFFICE AT [phone].  SEEK IMMEDIATE MEDICAL ATTENTION AND CALL THE OFFICE: [phone] IF:  You have more than a spotting of blood in your stool.   Your belly is swollen (abdominal distention).   You are nauseated or vomiting.   You have a temperature over 101F.   You have abdominal pain or discomfort that is severe or gets worse throughout the day.

## 2015-03-27 NOTE — Op Note (Signed)
[facility] [address] [place], [address]   ENDOSCOPY PROCEDURE REPORT  PATIENT: Wanda [name], Wanda [name]  MR#: [id number] BIRTHDATE: 1975/11/01 , 38  yrs. old GENDER: female  ENDOSCOPIST: Danie Binder, MD REFERRED BY: PROCEDURE DATE: 04/09/15 PROCEDURE:   EGD, screening  INDICATIONS:screening for varices. MEDICATIONS: Fentanyl 100 mcg IV, Versed 8 mg IV, and Promethazine (Phenergan) 12.5 mg IV TOPICAL ANESTHETIC:   Viscous Xylocaine ASA CLASS:  DESCRIPTION OF PROCEDURE:     Physical exam was performed.  Informed consent was obtained from the patient after explaining the benefits, risks, and alternatives to the procedure.  The patient was connected to the monitor and placed in the left lateral position.  Continuous oxygen was provided by nasal cannula and IV medicine administered through an indwelling cannula.  After administration of sedation, the patients esophagus was intubated and the EG-[id number] ([id number])  endoscope was advanced under direct visualization to the second portion of the duodenum.  The scope was removed slowly by carefully examining the color, texture, anatomy, and integrity of the mucosa on the way out.  The patient was recovered in endoscopy and discharged home in satisfactory condition.  Estimated blood loss is zero unless otherwise noted in this procedure report.     ESOPHAGUS: GRADE 1 ESOPHAGEAL VARICES.   STOMACH: MODERATE PORTAL HYPERTENSIVE GASTROPATHY.  NO FUNDAL VARICES.   DUODENUM: NO DUODENAL VARICES.   The duodenal mucosa showed no abnormalities in the bulb and 2nd part of the duodenum. COMPLICATIONS: There were no immediate complications.  ENDOSCOPIC IMPRESSION: 1.   GRADE 1 ESOPHAGEAL VARICES 2.   MODERATE PORTAL HYPERTENSIVE GASTROPATHY.  RECOMMENDATIONS: CONTINUE PROTONIX, LACTULOSE, AND DIURETICS. FOLLOW UP IN DEC 2016. REPEAT EGD IN JAN 2017.  REPEAT EXAM:  eSigned:  Danie Binder, MD 04-09-2015 2:03  PM     CPT CODES: ICD CODES:  The ICD and CPT codes recommended by this software are interpretations from the data that the clinical staff has captured with the software.  The verification of the translation of this report to the ICD and CPT codes and modifiers is the sole responsibility of the health care institution and practicing physician where this report was generated.  [employer] will not be held responsible for the validity of the ICD and CPT codes included on this report.  AMA assumes no liability for data contained or not contained herein. CPT is a Designer, television/film set of the [employer].

## 2015-03-27 NOTE — H&P (Signed)
Primary Care Physician:  [name] Primary Gastroenterologist:  Dr. Oneida Alar  Pre-Procedure History & Physical: HPI:  Wanda [name] is a 39 y.o. female here for [place].  Past Medical History  Diagnosis Date  . GERD (gastroesophageal reflux disease)   . 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 c/w steatohepatitis  . Folate deficiency 09/2013  . Anasarca 10/10/2013  . Hematemesis/vomiting blood 02/24/2014  . Acute blood loss anemia 02/25/2014    Status post transfusion  . Macrocytosis 02/28/2014  . 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 Acute renal failure 09/2013    Pre-renal- resolved  . C. difficile colitis 04/19/2014  . Anxiety   . Depression   . Thrombocytopenia     Hypercellular bone marrow; abundant megakaryocytes per 08/27/2014; s/p bone marrow bx  . Cirrhosis of liver with ascites   . SBP (spontaneous bacterial peritonitis) 11/10/2013  . Bipolar disorder 12/04/2013    2007-SEEN IN ED FOR INVOLUNTARY COMMITMENT, UDS POS FOR AMPHETAMINES/OPIATES   . PNA (pneumonia) 10/13/2013  . Chronic hypotension   . Gastroesophageal junction ulcer 09/17/2014  . ESRD (end stage renal disease) on dialysis 08/2014    Past Surgical History  Procedure Laterality Date  . None    . Paracentesis  Feb 2015    1180 fluid, negative fluid analysis.   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 Paracentesis  10/2013  . Colonoscopy N/A 12/19/2013    SLF:NO OBVIOUS SOURCE FOR ANEMIA IDETIFIED/ONE COLON POLYP REMOVED/Small internal hemorrhoids  . Esophagogastroduodenoscopy N/A 02/11/2014    Dr. Rourk:Esophageal varices with bleeding stigmata-status post esophageal band ligation therapy. Portal gastropathy  . Esophagogastroduodenoscopy N/A 07/04/2014    RMR: Persiting grade 2 esophageal  varicies with bleeding stigmata status post band ligation. Significantly congested gastric mucosa iwith changes constistant with protal gastropathy.   . Esophageal banding  07/04/2014    Procedure: ESOPHAGEAL BANDING;  Surgeon: Daneil Dolin, MD;  Location: [facility] END[facility] SUITE;  Service: Endoscopy;;  . Esophagogastroduodenoscopy (egd) with propofol N/A 07/24/2014    SLF:  1. 2 columns grade 2-3 varices- 2 Bands applied.  2.  Moderate gastropathy 3. Duodenal Diverticula  . Esophageal banding N/A 07/24/2014    Procedure: ESOPHAGEAL BANDING (2 bands applied);  Surgeon: Danie Binder, MD;  Location: [facility] ORS;  Service: Endoscopy;  Laterality: N/A;  . Central venous catheter insertion Right   . Esophagogastroduodenoscopy N/A 09/16/2014    Rehman: Single short column of varix proximal to GE junction not large enough to be banded. Two amall ulcers at the GEJ felt to be source of GI Bleeding but no active bleeding but no actibe bleeding noted. No therapy rendered. Portal gastropathy NO evidence of peptic ulcer diease or gastric varices.   . Esophagogastroduodenoscopy (egd) with propofol N/A 10/26/2014    RMR: 2 columns of grade 2 esophageal varices without obvious bleeding stigmata status post band ligation to complet obliteration of remaining varices.   . Av fistula placement Right 11/16/2014    Procedure: Right arm Creation of arteriovenous fistula;  Surgeon: Angelia Mould, MD;  Location: [facility] OR;  Service: Vascular;  Laterality: Right;  . Esophagogastroduodenoscopy N/A 12/14/2014    SLF: Grade ! esophageal varices. 2. Moderate Portal Gastropathy    Prior to Admission medications   Medication Sig Start Date End Date Taking? Authorizing  Provider  lactulose (CHRONULAC) 10 GM/15ML solution Take 15 mLs (10 g total) by mouth 2 (two) times daily. 02/22/15  Yes Mahala Menghini, PA-C  LORazepam (ATIVAN) 1 MG tablet HALF TO 1 TAB PO TWICE DAILY FOR BACK SPASM OR ANXIETY 11/14/14  Yes Danie Binder, MD  metolazone  (ZAROXOLYN) 5 MG tablet Take 2 tablets (10 mg total) by mouth daily. 09/21/14  Yes Orvil Feil, NP  midodrine (PROAMATINE) 10 MG tablet Take 1 tablet (10 mg total) by mouth 2 (two) times daily with a meal. Medication to keep your blood pressure from falling. 04/27/14  Yes Rexene Alberts, MD  Oxycodone HCl 10 MG TABS 1 PO TID AS NEEDED FOR PAIN 03/14/15  Yes Danie Binder, MD  pantoprazole (PROTONIX) 40 MG tablet Take 1 tablet (40 mg total) by mouth 2 (two) times daily. 09/19/14  Yes Kathie Dike, MD  promethazine (PHENERGAN) 12.5 MG tablet Take 1 tablet (12.5 mg total) by mouth every 6 (six) hours as needed for nausea or vomiting. 09/05/14  Yes Danie Binder, MD  folic acid (FOLVITE) 1 MG tablet Take 1 tablet (1 mg total) by mouth daily. 10/02/14   Baird Cancer, PA-C  loperamide (IMODIUM) 2 MG capsule Take 1 capsule (2 mg total) by mouth 2 (two) times daily as needed for diarrhea or loose stools. 08/28/14   Radene Gunning, NP    Allergies as of 02/25/2015 - Review Complete 02/22/2015  Allergen Reaction Noted  . Lasix [furosemide]  04/12/2014  . Morphine and related Hives and Itching 09/16/2014    Family History  Problem Relation Age of Onset  . Heart disease Mother   . Colon cancer Neg Hx   . Liver disease Neg Hx     Social History   Social History  . Marital Status: Single    Spouse Name: N/A  . Number of Children: 1  . Years of Education: N/A   Occupational History  . unemployed    Social History Main Topics  . Smoking status: Former Smoker -- 0.25 packs/day for 20 years    Types: Cigarettes    Quit date: 11/05/2008  . Smokeless tobacco: Never Used  . Alcohol Use: No  . Drug Use: No  . Sexual Activity:    Partners: Male    Birth Control/ Protection: None   Other Topics Concern  . Not on file   Social History Narrative    Review of Systems: See HPI, otherwise negative ROS   Physical Exam: BP 107/67 mmHg  Pulse 98  Temp(Src) 98.1 F (36.7 C) (Oral)  Resp 15   Ht 5\' 1"  (1.549 m)  Wt 160 lb (72.576 kg)  BMI 30.25 kg/m2  SpO2 99%  LMP 09/19/2013 General:   Alert,  pleasant and cooperative in NAD Head:  Normocephalic and atraumatic. Neck:  Supple; Lungs:  Clear throughout to auscultation.    Heart:  Regular rate and rhythm. Abdomen:  Soft, nontender and nondistended. Normal bowel sounds, without guarding, and without rebound.   Neurologic:  Alert and  oriented x4;  grossly normal neurologically.  Impression/Plan:     SCREENING VARICES  PLAN:  1.EGD TODAY

## 2015-03-28 ENCOUNTER — Encounter (HOSPITAL_COMMUNITY): Payer: Self-pay | Admitting: Gastroenterology

## 2015-03-29 IMAGING — CT CT BIOPSY
1 series · 1 of 19 positions shown · non-contrast
Comparison: none

CLINICAL DATA: Unexplained thrombocytopenia. The patient requires a
bone marrow biopsy.

[Series 2: localizer · axial · 5.0mm · 0.74mm/px · 1 of 19 slices shown]
[im 10/19]
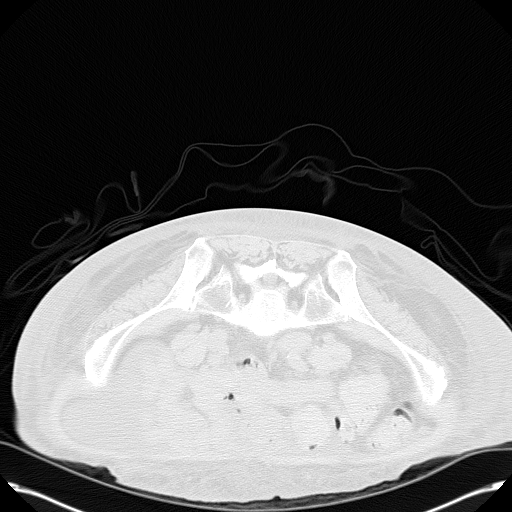

[1 of 19 positions shown; findings below may reference images not displayed]

EXAM:
CT GUIDED ASPIRATE AND CORE RIGHT ILIAC BONE MARROW BIOPSY

ANESTHESIA/SEDATION:
Versed 2.0 mg IV, Fentanyl 100 mcg IV

Total Moderate Sedation Time

7 minutes.

PROCEDURE:
The procedure risks, benefits, and alternatives were explained to
the patient. Questions regarding the procedure were encouraged and
answered. The patient understands and consents to the procedure.

The right gluteal region was prepped with Betadine. Sterile gown and
sterile gloves were used for the procedure. Local anesthesia was
provided with 1% Lidocaine. A time-out was performed prior to the
procedure.

Under CT guidance, an 11 gauge OnControl bone cutting needle was
advanced from a posterior approach into the right iliac bone. Needle
positioning was confirmed with CT. Initial non heparinized and
heparinized aspirate samples were obtained of bone marrow. Core
biopsy was performed with the outer biopsy needle.

COMPLICATIONS:
None
FINDINGS: Inspection of initial aspirate did reveal visible particles. Intact
core biopsy sample was obtained.
IMPRESSION: CT guided bone marrow biopsy of right posterior iliac bone with both
aspirate and core samples obtained.

## 2015-03-30 IMAGING — US US PARACENTESIS
1 series · 3 of 3 positions shown · non-contrast
Comparison: 08/16/2014

CLINICAL DATA: Cirrhosis secondary to NASH, recurrent ascites

EXAM:
ULTRASOUND GUIDED THERAPEUTIC PARACENTESIS

[Series 1: us paracentesis · 0.20mm/px · 3 of 3 slices shown]
[im 1/3]
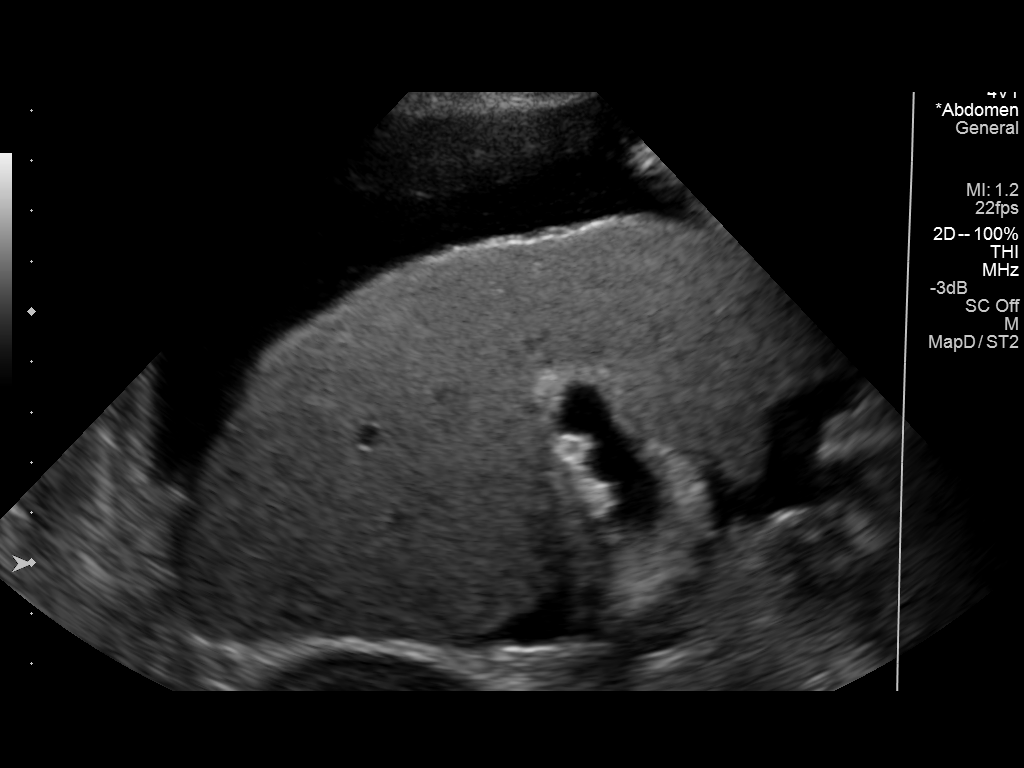
[im 2/3]
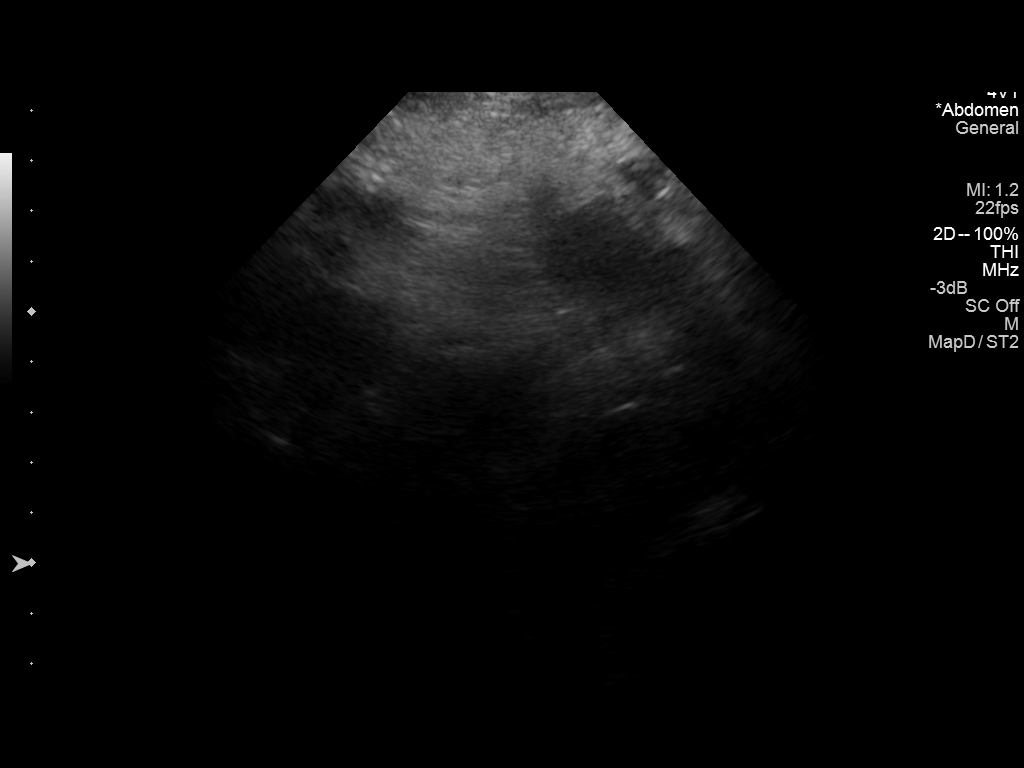
[im 3/3]
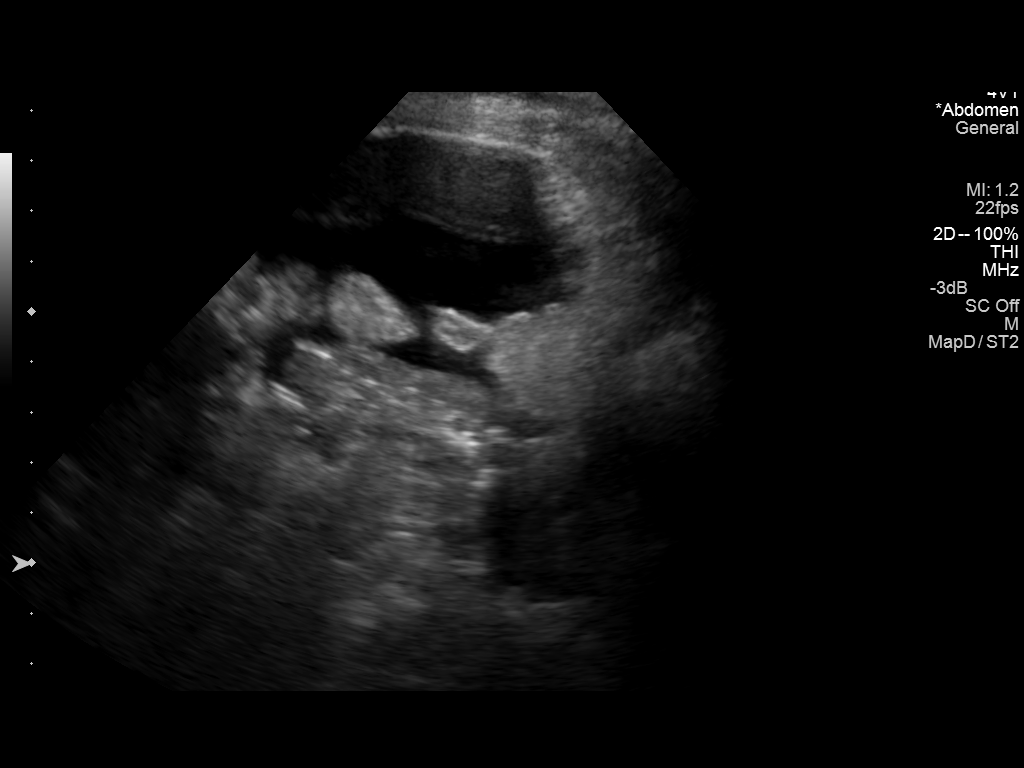

[3 of 3 positions shown; findings below may reference images not displayed]

PROCEDURE:
Procedure, benefits, and risks of procedure were discussed with
patient.

Written informed consent for procedure was obtained.

Time out protocol followed.

Adequate collection of ascites localized by ultrasound in RIGHT
lower quadrant.

Skin prepped and draped in usual sterile fashion.

Skin and soft tissues anesthetized with 10 mL of 1% lidocaine.

5 French Yueh catheter placed into peritoneal cavity.

0677 mL of yellow fluid aspirated by vacuum bottle suction.

Procedure tolerated well by patient without immediate complication.
FINDINGS: As above
IMPRESSION: Successful ultrasound guided paracentesis yielding 0677 mL of
ascites.

## 2015-04-10 ENCOUNTER — Encounter (HOSPITAL_COMMUNITY): Payer: Self-pay

## 2015-04-10 ENCOUNTER — Ambulatory Visit (HOSPITAL_COMMUNITY)
Admission: RE | Admit: 2015-04-10 | Discharge: 2015-04-10 | Disposition: A | Discharge status: Home / Self Care | Payer: Medicare Other | Source: Ambulatory Visit | Attending: Gastroenterology | Admitting: Gastroenterology

## 2015-04-10 VITALS — BP 104/69 | HR 100 | Resp 16

## 2015-04-10 DIAGNOSIS — K7460 Unspecified cirrhosis of liver: Secondary | ICD-10-CM | POA: Diagnosis not present

## 2015-04-10 DIAGNOSIS — R188 Other ascites: Secondary | ICD-10-CM | POA: Insufficient documentation

## 2015-04-10 MED ORDER — ALBUMIN HUMAN 25 % IV SOLN
INTRAVENOUS | Status: AC
  Administered 2015-04-10: 25 g via INTRAVENOUS
  Filled 2015-04-10: qty 100

## 2015-04-10 MED ORDER — ALBUMIN HUMAN 25 % IV SOLN
25.0000 g | Freq: Once | INTRAVENOUS | Status: AC
  Administered 2015-04-10: 25 g via INTRAVENOUS

## 2015-04-10 NOTE — Progress Notes (Signed)
Paracentesis complete no signs of distress. 6500 ml amber colored ascites removed.  

## 2015-04-10 NOTE — Procedures (Signed)
PreOperative Dx: Cirrhosis, ascites Postoperative Dx: Cirrhosis, ascites Procedure:   US guided paracentesis Radiologist:  Thornton Papas Anesthesia:  10 ml of 1% lidocaine Specimen:  6450 ml of yellow ascitic fluid EBL:   < 1 ml Complications: None

## 2015-04-11 ENCOUNTER — Other Ambulatory Visit (HOSPITAL_COMMUNITY): Payer: Self-pay | Admitting: Nephrology

## 2015-04-11 ENCOUNTER — Telehealth: Payer: Self-pay

## 2015-04-11 DIAGNOSIS — N186 End stage renal disease: Secondary | ICD-10-CM

## 2015-04-11 MED ORDER — OXYCODONE HCL 10 MG PO TABS: ORAL_TABLET | ORAL | Status: DC

## 2015-04-11 NOTE — Telephone Encounter (Signed)
Tried to call pt. VM not set up. Dr. Oneida Alar has a prescription for Oxycodone 10 mg tablets #90 one tid as needed. Rx for for Oct, Nov, Dec also written.

## 2015-04-11 NOTE — Telephone Encounter (Signed)
PLEASE CALL PT. I CAN REFILL HER OXYCODONE BUT SHE IS ALREADY TAKING THE HIGHEST DOSE OF SHORT ACTING OXYCODONE. SHE NEEDS TO SEE A PAIN SPECIALIST IF HER PAIN IS NOT CONTROLLED WITH THE CURRENT DOSE. REFILLS GIVEN FOR SEP 1, OCT 1, NOV 1, AND DEC 1. NEXT REFILL DUE Aug 11, 2015.

## 2015-04-11 NOTE — Telephone Encounter (Signed)
Pt called and states she was wondering if the doctor could refill her RX for her pain medication.

## 2015-04-11 NOTE — Telephone Encounter (Signed)
Tried to call pt. Could not leave a VM. Routing to Dr. Oneida Alar to address the pain medication.

## 2015-04-11 NOTE — Telephone Encounter (Signed)
Pt is aware. Also informed her that we close at 12 tomorrow and that the office will be closed on Monday d/t a holiday. She said she would try to come by in the AM.

## 2015-04-11 NOTE — Telephone Encounter (Signed)
CALLED AND ALSO WANTED TO KNOW IF IT CAN BE INCREASED IN DOSAGE   PLEASE ADVISE

## 2015-04-11 NOTE — Telephone Encounter (Signed)
Please remind patient that she was supposed to have CBC with pathologist smear done last month (because of the contaminated blood transfusion issue -see 02/21/15 telephone note).   Regarding pain medication, I would like you to address with Dr. Oneida Alar.

## 2015-04-12 NOTE — Telephone Encounter (Signed)
Pt's boyfriend, [name], came by and picked up the prescriptions.  I printed out the lab she should have done in July to follow up on contaminated blood transfusion. I asked him to have her do that lab work soon! He said she goes to [facility] on 04/22/2015.

## 2015-04-16 ENCOUNTER — Other Ambulatory Visit: Payer: Self-pay | Admitting: Gastroenterology

## 2015-04-17 ENCOUNTER — Ambulatory Visit (HOSPITAL_COMMUNITY): Admission: RE | Admit: 2015-04-17 | Payer: Medicare Other | Source: Ambulatory Visit

## 2015-04-22 ENCOUNTER — Encounter (HOSPITAL_COMMUNITY): Payer: Self-pay

## 2015-04-22 ENCOUNTER — Ambulatory Visit (HOSPITAL_COMMUNITY)
Admission: RE | Admit: 2015-04-22 | Discharge: 2015-04-22 | Disposition: A | Discharge status: Home / Self Care | Payer: Medicare Other | Source: Ambulatory Visit | Attending: Gastroenterology | Admitting: Gastroenterology

## 2015-04-22 DIAGNOSIS — R188 Other ascites: Secondary | ICD-10-CM | POA: Insufficient documentation

## 2015-04-22 MED ORDER — ALBUMIN HUMAN 25 % IV SOLN
INTRAVENOUS | Status: AC
  Administered 2015-04-22: 25 g
  Filled 2015-04-22: qty 100

## 2015-04-22 NOTE — Progress Notes (Signed)
Paracentesis complete no signs of distress. 4700 ml amber colored ascites removed.   

## 2015-04-23 ENCOUNTER — Telehealth: Payer: Self-pay

## 2015-04-23 NOTE — Telephone Encounter (Signed)
HAVE PT STOP BY ENDO SEP 14 TO RE-ASSESS SITE.

## 2015-04-23 NOTE — Telephone Encounter (Signed)
Pt had para yesterday. [name] boyfriend is calling office and states that they usually use right side but yesterday they used the left side. [name] states that the pain has been hurting all day. States that site is not warm or red. States that site is puffy and swollen. Doesn't know what they need to do to relieve the pain.

## 2015-04-24 ENCOUNTER — Ambulatory Visit (HOSPITAL_COMMUNITY)
Admission: RE | Admit: 2015-04-24 | Discharge: 2015-04-24 | Disposition: A | Discharge status: Home / Self Care | Payer: Medicare Other | Source: Ambulatory Visit | Attending: Nephrology | Admitting: Nephrology

## 2015-04-24 DIAGNOSIS — N186 End stage renal disease: Secondary | ICD-10-CM | POA: Insufficient documentation

## 2015-04-24 DIAGNOSIS — Z452 Encounter for adjustment and management of vascular access device: Secondary | ICD-10-CM | POA: Diagnosis present

## 2015-04-24 MED ORDER — CHLORHEXIDINE GLUCONATE 4 % EX LIQD
CUTANEOUS | Status: AC
  Filled 2015-04-24: qty 15

## 2015-04-24 MED ORDER — LIDOCAINE HCL 1 % IJ SOLN
INTRAMUSCULAR | Status: AC
  Filled 2015-04-24: qty 20

## 2015-04-24 NOTE — Telephone Encounter (Signed)
Pt boyfriend is aware of doctor recommendations.

## 2015-04-26 MED ORDER — CEFUROXIME AXETIL 250 MG PO TABS: ORAL_TABLET | ORAL | Status: DC

## 2015-04-26 NOTE — Addendum Note (Signed)
Addended by: Danie Binder on: 04/26/2015 12:47 PM   Modules accepted: Orders

## 2015-04-26 NOTE — Telephone Encounter (Signed)
Wanda [name] is calling back again today because she is still hurt from her para site. They did not come to the Endo on the 14th because they was in [place]. Please advise

## 2015-04-26 NOTE — Telephone Encounter (Signed)
Wanda [name] is aware and will bring her up there.

## 2015-04-26 NOTE — Telephone Encounter (Signed)
PT SEEN IN ENDO.MISSED HD ALL WEEK. C/O LEFT SIDED ABD PAIN. MILD ERYTHEMA AND EDEMA IN LEFT LOWER QUADRANT. ABDOMEN MILDY DISTENDED. DIFFERENTIAL DIAGNOSIS INCLUDES ABD PAIN/NV DUE TO UREMIA, MILD CELLULITIS, LESS LIKELY PERITONITIS AFTER LVP.

## 2015-04-26 NOTE — Telephone Encounter (Signed)
PT CAN COME TO ENDO TODAY TO HAVE SITE CHECKED.

## 2015-04-29 ENCOUNTER — Telehealth: Payer: Self-pay | Admitting: Gastroenterology

## 2015-04-29 NOTE — Telephone Encounter (Signed)
REVIEWED-NO ADDITIONAL RECOMMENDATIONS. 

## 2015-04-29 NOTE — Telephone Encounter (Signed)
I returned the call to [name], he is at work. He said pt's stools have been black and tarry for the last couple of days. I asked if she had taken Pepto for any diarrhea and he said he is not aware of any recent diarrhea, but pt vomited x 1 this AM. I told him that Dr. Oneida Alar will probably send to ED, if she worsens she can go, but I will return call when I hear from Dr. Oneida Alar.

## 2015-04-29 NOTE — Telephone Encounter (Signed)
I called and left the Vm on [name]'s phone and told him pt needs to go to ED now and asked that he would call and let me know that he got the message.

## 2015-04-29 NOTE — Telephone Encounter (Signed)
Pt's boyfriend called to say that patient had started vomiting this morning and her stools are black tarry. Please call [phone]

## 2015-04-29 NOTE — Telephone Encounter (Signed)
[name] called and told Marzetta Board he got message and is taking pt to the hospital.

## 2015-04-29 NOTE — Telephone Encounter (Signed)
PLEASE CALL BOYFRIEND. IF PT IS HAVING BLACK TARRY STOOLS, SHE NEEDS TO GO TO ED NOW.

## 2015-04-30 NOTE — Telephone Encounter (Signed)
I called [name] to see why pt did not go to the ED yesterday. He said she just decided she didn't want to go and her stools are lighter now. She is at dialysis now and she did not go any last week, said she was too weak to go.  I told him if she is that weak, she really should go to the ED for an assessment.  He said that he would let her know.

## 2015-04-30 NOTE — Telephone Encounter (Signed)
REVIEWED-NO ADDITIONAL RECOMMENDATIONS. 

## 2015-05-05 IMAGING — US US PARACENTESIS
1 series · 5 of 5 positions shown · non-contrast
Comparison: 09/14/2014

CLINICAL DATA: Cirrhosis, NASH, ascites

EXAM:
ULTRASOUND GUIDED THERAPEUTIC PARACENTESIS

[Series 1: us paracentesis · 0.21mm/px · 5 of 5 slices shown]
[im 1/5]
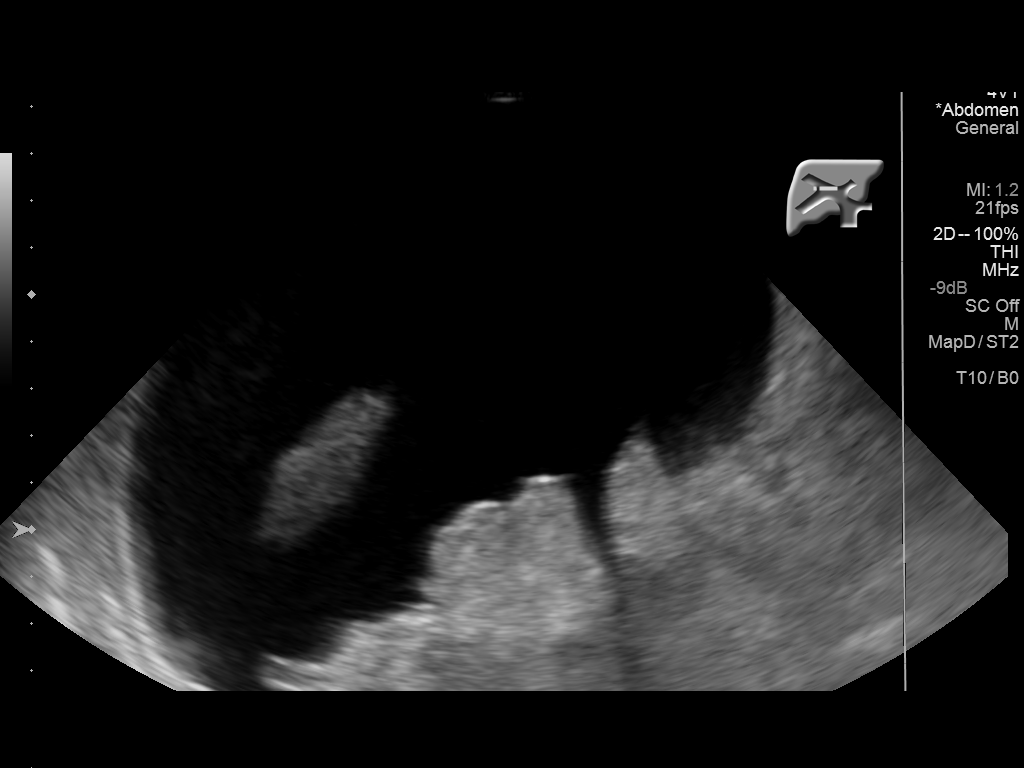
[im 2/5]
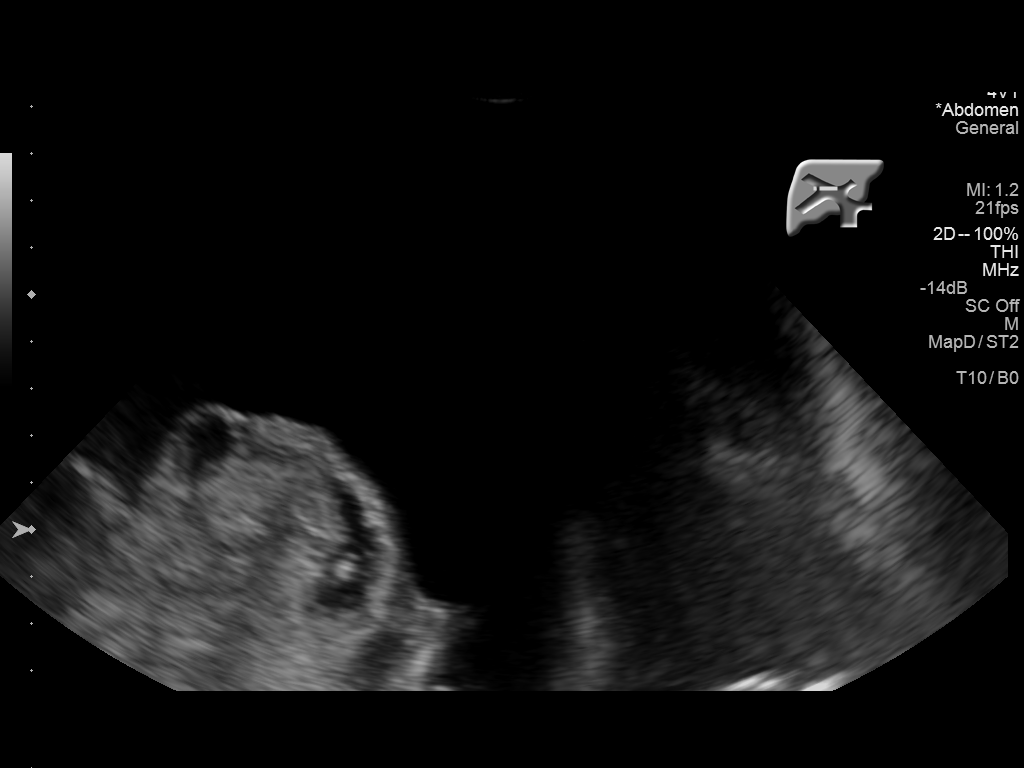
[im 3/5]
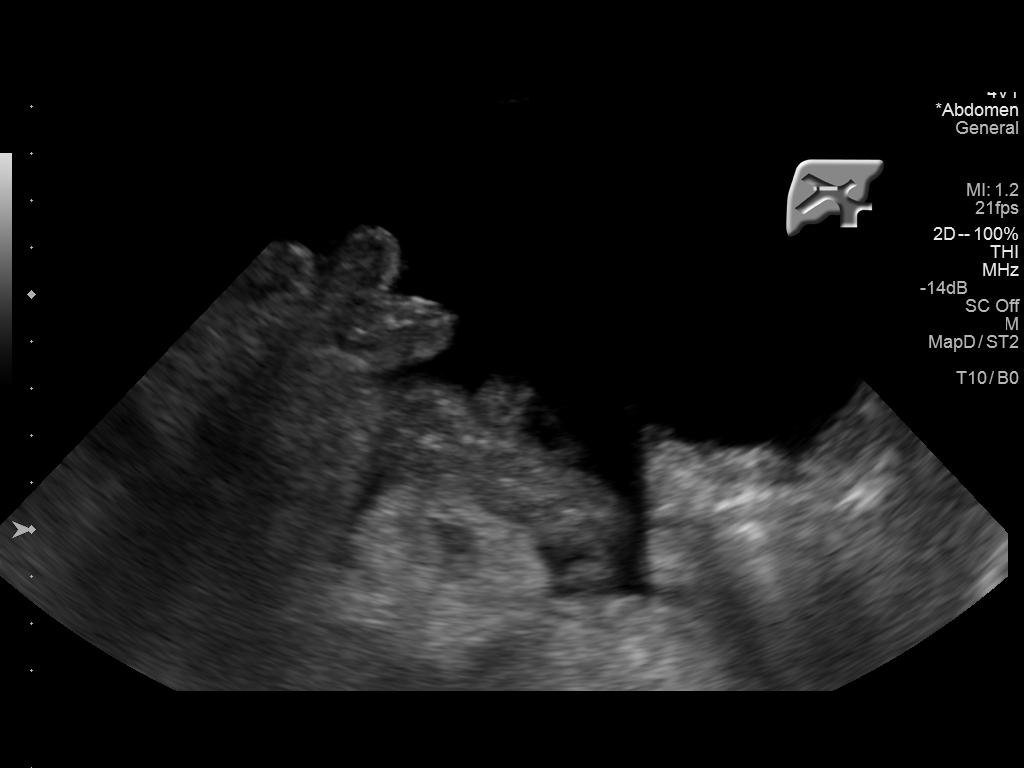
[im 4/5]
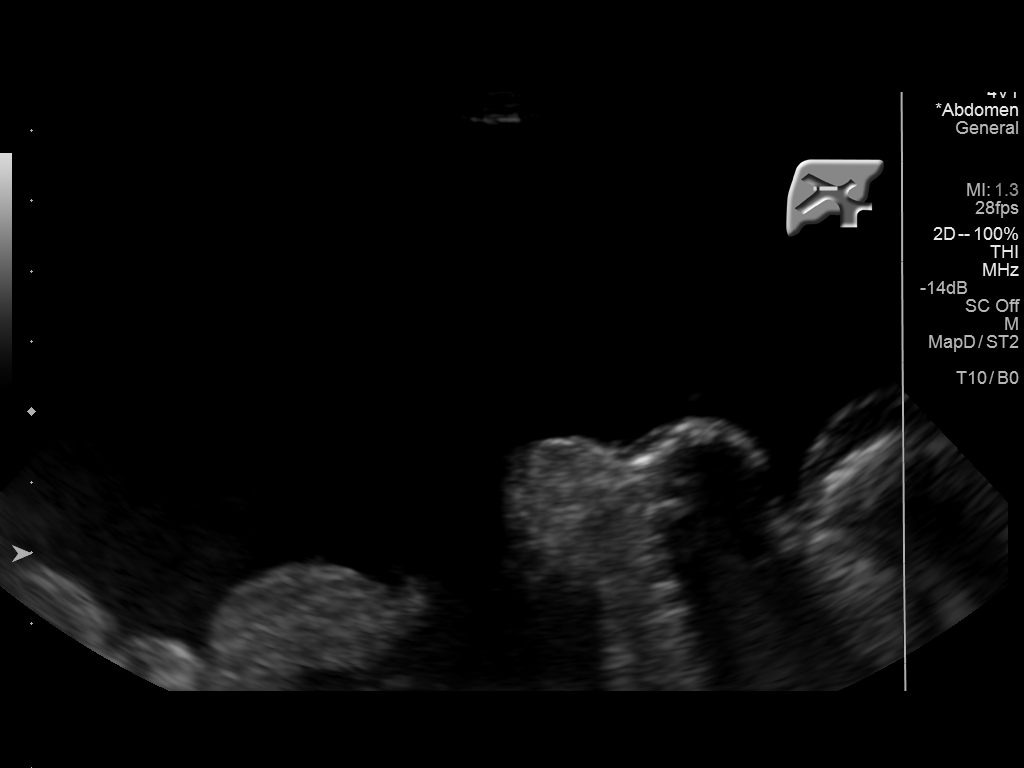
[im 5/5]
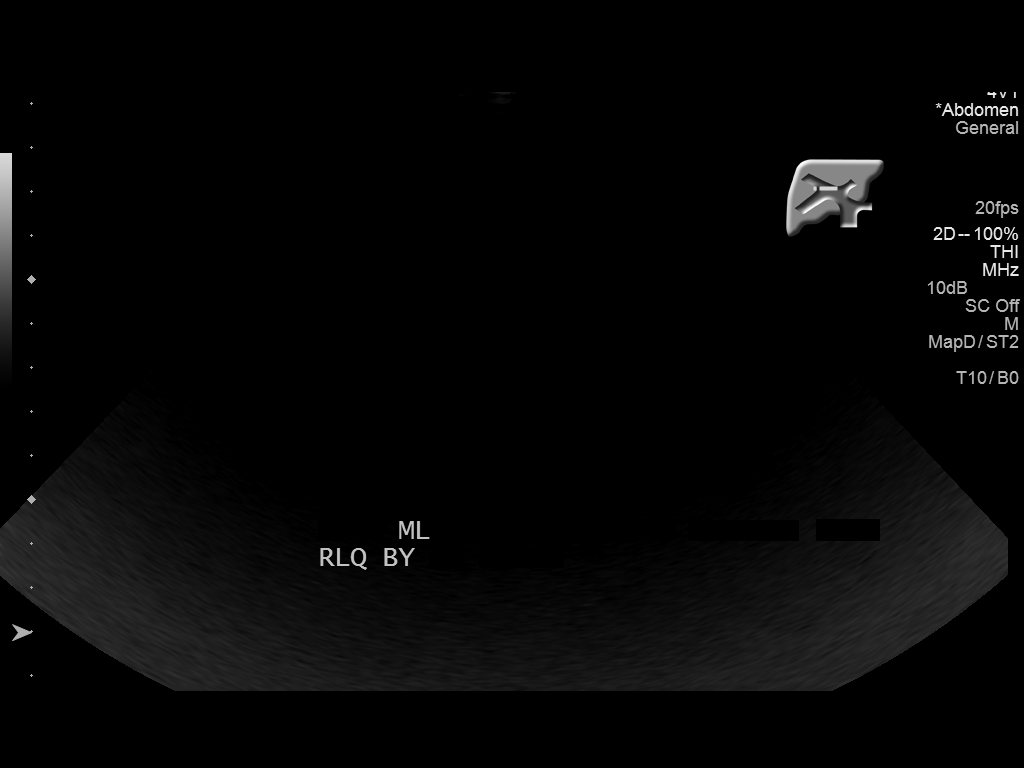

[5 of 5 positions shown; findings below may reference images not displayed]

PROCEDURE:
Procedure, benefits, and risks of procedure were discussed with
patient.

Written informed consent for procedure was obtained.

Time out protocol followed.

Adequate collection of ascites localized by ultrasound in RIGHT mid
abdomen.

Skin prepped and draped in usual sterile fashion.

Skin and soft tissues anesthetized with 10 mL of 1% lidocaine.

5 French Yueh catheter placed into peritoneal cavity.

4666 mL of yellow ascitic fluid aspirated by vacuum bottle suction.

Procedure tolerated well by patient without immediate complication.
FINDINGS: As above
IMPRESSION: Successful ultrasound guided paracentesis yielding 4666 mL of
ascites.

## 2015-05-08 ENCOUNTER — Ambulatory Visit (HOSPITAL_COMMUNITY)
Admission: RE | Admit: 2015-05-08 | Discharge: 2015-05-08 | Disposition: A | Discharge status: Home / Self Care | Payer: Medicare Other | Source: Ambulatory Visit | Attending: Gastroenterology | Admitting: Gastroenterology

## 2015-05-08 DIAGNOSIS — K7460 Unspecified cirrhosis of liver: Secondary | ICD-10-CM | POA: Insufficient documentation

## 2015-05-08 DIAGNOSIS — R188 Other ascites: Secondary | ICD-10-CM | POA: Insufficient documentation

## 2015-05-08 MED ORDER — ALBUMIN HUMAN 25 % IV SOLN
INTRAVENOUS | Status: AC
  Administered 2015-05-08: 25 g via INTRAVENOUS
  Filled 2015-05-08: qty 100

## 2015-05-08 MED ORDER — ALBUMIN HUMAN 25 % IV SOLN
25.0000 g | Freq: Once | INTRAVENOUS | Status: AC
  Administered 2015-05-08: 25 g via INTRAVENOUS

## 2015-05-08 NOTE — Procedures (Signed)
PreOperative Dx: Cirrhosis, ascites Postoperative Dx: Cirrhosis, ascites Procedure:   US guided paracentesis Radiologist:  Boles Anesthesia:  10 ml of 1% lidocaine Specimen:  6000 ml of amber colored ascitic fluid EBL:   < 1 ml Complications: None  

## 2015-05-08 NOTE — Progress Notes (Signed)
Paracentesis complete no signs of distress. 6000 ml amber colored ascites removed.  

## 2015-05-10 ENCOUNTER — Other Ambulatory Visit: Payer: Self-pay | Admitting: Gastroenterology

## 2015-05-14 ENCOUNTER — Emergency Department (HOSPITAL_COMMUNITY)
Admission: EM | Admit: 2015-05-14 | Discharge: 2015-05-14 | Disposition: A | Discharge status: Home / Self Care | Payer: Medicare Other | Attending: Emergency Medicine | Admitting: Emergency Medicine

## 2015-05-14 ENCOUNTER — Encounter (HOSPITAL_COMMUNITY): Payer: Self-pay

## 2015-05-14 DIAGNOSIS — F419 Anxiety disorder, unspecified: Secondary | ICD-10-CM | POA: Insufficient documentation

## 2015-05-14 DIAGNOSIS — F329 Major depressive disorder, single episode, unspecified: Secondary | ICD-10-CM | POA: Insufficient documentation

## 2015-05-14 DIAGNOSIS — N186 End stage renal disease: Secondary | ICD-10-CM | POA: Insufficient documentation

## 2015-05-14 DIAGNOSIS — M7989 Other specified soft tissue disorders: Secondary | ICD-10-CM | POA: Diagnosis not present

## 2015-05-14 DIAGNOSIS — Z91040 Latex allergy status: Secondary | ICD-10-CM | POA: Insufficient documentation

## 2015-05-14 DIAGNOSIS — Z8639 Personal history of other endocrine, nutritional and metabolic disease: Secondary | ICD-10-CM | POA: Insufficient documentation

## 2015-05-14 DIAGNOSIS — R252 Cramp and spasm: Secondary | ICD-10-CM | POA: Insufficient documentation

## 2015-05-14 DIAGNOSIS — K219 Gastro-esophageal reflux disease without esophagitis: Secondary | ICD-10-CM | POA: Insufficient documentation

## 2015-05-14 DIAGNOSIS — Z79899 Other long term (current) drug therapy: Secondary | ICD-10-CM | POA: Diagnosis not present

## 2015-05-14 DIAGNOSIS — Z8701 Personal history of pneumonia (recurrent): Secondary | ICD-10-CM | POA: Diagnosis not present

## 2015-05-14 DIAGNOSIS — Z992 Dependence on renal dialysis: Secondary | ICD-10-CM | POA: Diagnosis not present

## 2015-05-14 DIAGNOSIS — M79641 Pain in right hand: Secondary | ICD-10-CM | POA: Diagnosis present

## 2015-05-14 DIAGNOSIS — Z8619 Personal history of other infectious and parasitic diseases: Secondary | ICD-10-CM | POA: Diagnosis not present

## 2015-05-14 DIAGNOSIS — R140 Abdominal distension (gaseous): Secondary | ICD-10-CM | POA: Insufficient documentation

## 2015-05-14 DIAGNOSIS — Z862 Personal history of diseases of the blood and blood-forming organs and certain disorders involving the immune mechanism: Secondary | ICD-10-CM | POA: Diagnosis not present

## 2015-05-14 DIAGNOSIS — Z8679 Personal history of other diseases of the circulatory system: Secondary | ICD-10-CM | POA: Insufficient documentation

## 2015-05-14 DIAGNOSIS — Z87891 Personal history of nicotine dependence: Secondary | ICD-10-CM | POA: Diagnosis not present

## 2015-05-14 LAB — BASIC METABOLIC PANEL
Anion gap: 10 (ref 5–15)
BUN: 27 mg/dL — AB (ref 6–20)
CALCIUM: 8.1 mg/dL — AB (ref 8.9–10.3)
CHLORIDE: 100 mmol/L — AB (ref 101–111)
CO2: 26 mmol/L (ref 22–32)
CREATININE: 2.28 mg/dL — AB (ref 0.44–1.00)
GFR calc non Af Amer: 25 mL/min — ABNORMAL LOW (ref >60)
GFR, EST AFRICAN AMERICAN: 30 mL/min — AB (ref >60)
GLUCOSE: 102 mg/dL — AB (ref 65–99)
Potassium: 3.8 mmol/L (ref 3.5–5.1)
Sodium: 136 mmol/L (ref 135–145)

## 2015-05-14 LAB — I-STAT CHEM 8, ED
BUN: 26 mg/dL — ABNORMAL HIGH (ref 6–20)
CALCIUM ION: 1.1 mmol/L — AB (ref 1.12–1.23)
Chloride: 98 mmol/L — ABNORMAL LOW (ref 101–111)
Creatinine, Ser: 2.3 mg/dL — ABNORMAL HIGH (ref 0.44–1.00)
GLUCOSE: 102 mg/dL — AB (ref 65–99)
HCT: 34 % — ABNORMAL LOW (ref 36.0–46.0)
HEMOGLOBIN: 11.6 g/dL — AB (ref 12.0–15.0)
Potassium: 3.7 mmol/L (ref 3.5–5.1)
SODIUM: 139 mmol/L (ref 135–145)
TCO2: 25 mmol/L (ref 0–100)

## 2015-05-14 LAB — MAGNESIUM: Magnesium: 2.2 mg/dL (ref 1.7–2.4)

## 2015-05-14 MED ORDER — DIAZEPAM 5 MG PO TABS: 5.0000 mg | ORAL_TABLET | Freq: Four times a day (QID) | ORAL | Status: DC | PRN

## 2015-05-14 MED ORDER — DIAZEPAM 5 MG/ML IJ SOLN
2.5000 mg | Freq: Once | INTRAMUSCULAR | Status: AC
  Administered 2015-05-14: 2.5 mg via INTRAMUSCULAR
  Filled 2015-05-14: qty 2

## 2015-05-14 NOTE — ED Notes (Signed)
Both of my hands started cramping after I got out of dialysis today per pt.

## 2015-05-14 NOTE — ED Provider Notes (Signed)
CSN: [id number]     Arrival date & time 05/14/15  1936 History  By signing my name below, I, Meriel Pica, attest that this documentation has been prepared under the direction and in the presence of Merrily Pew, MD. Electronically Signed: Meriel Pica, ED Scribe. 05/14/2015. 9:03 PM.   Chief Complaint  Patient presents with  . Hand Pain   Patient is a 39 y.o. female presenting with hand pain. The history is provided by the patient. No language interpreter was used.  Hand Pain This is a new problem. The current episode started 3 to 5 hours ago. The problem has been gradually worsening. Pertinent negatives include no chest pain, no abdominal pain, no headaches and no shortness of breath. Nothing aggravates the symptoms. Relieved by: pressure. She has tried nothing for the symptoms. The treatment provided no relief.   HPI Comments: NIAMBI SMOAK is a 39 y.o. female, with a significant PMhx on T, TH, ST dialysis, who presents to the Emergency Department complaining of intermittent bilateral hand cramping that has been gradually worsening since onset after dialysis appointment today. Pt states approximately 9lbs of fluid was removed during dialysis today. The pt states that during the cramping sensation her fingers become rigid and stuck in certain positions.  Putting pressure on her fingers alleviated the cramping pain briefly. She reports a similar episode of hand cramping after dialysis in the past that she describes to be milder than her complaint this evening. There is edema to abdomen and BLE edema that is worse in the left leg and is her baseline. Pt denies any cramping in her legs or worsening of edema. She also denies fever, nausea, vomiting, vision changes, rashes, or any urinary symptoms.   Past Medical History  Diagnosis Date  . GERD (gastroesophageal reflux disease)   . Cirrhosis ([place]) 10/05/13    Liver bx 11/23/13 (delayed initially due to patient refusal). c/w steatohepatitis  .  Folate deficiency 09/2013  . Anasarca 10/10/2013  . Hematemesis/vomiting blood 02/24/2014  . Acute blood loss anemia 02/25/2014    Status post transfusion  . Macrocytosis 02/28/2014  . Bleeding esophageal varices ([place]) 02/28/2014    s/p banding  . Acute renal failure ([place]) 09/2013    Pre-renal- resolved  . C. difficile colitis 04/19/2014  . Anxiety   . Depression   . Thrombocytopenia ([place])     Hypercellular bone marrow; abundant megakaryocytes per 08/27/2014; s/p bone marrow bx  . Cirrhosis of liver with ascites ([place])   . SBP (spontaneous bacterial peritonitis) ([place]) 11/10/2013  . Bipolar disorder ([facility]) 12/04/2013    2007-SEEN IN ED FOR INVOLUNTARY COMMITMENT, UDS POS FOR AMPHETAMINES/OPIATES   . PNA (pneumonia) 10/13/2013  . Chronic hypotension   . Gastroesophageal junction ulcer 09/17/2014  . ESRD (end stage renal disease) on dialysis ([place]) 08/2014   Past Surgical History  Procedure Laterality Date  . None    . Paracentesis  Feb 2015    1180 fluid, negative fluid analysis.   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 Paracentesis  10/2013  . Colonoscopy N/A 12/19/2013    SLF:NO OBVIOUS SOURCE FOR ANEMIA IDETIFIED/ONE COLON POLYP REMOVED/Small internal hemorrhoids  . Esophagogastroduodenoscopy N/A 02/11/2014    Dr. Rourk:Esophageal varices with bleeding stigmata-status post esophageal band ligation therapy. Portal gastropathy  . Esophagogastroduodenoscopy N/A 07/04/2014    RMR: Persiting grade 2 esophageal varicies with bleeding stigmata status post band ligation.  Significantly congested gastric mucosa iwith changes constistant with protal gastropathy.   . Esophageal banding  07/04/2014    Procedure: ESOPHAGEAL BANDING;  Surgeon: Daneil Dolin, MD;  Location: [facility];  Service: Endoscopy;;  . Esophagogastroduodenoscopy (egd) with propofol N/A 07/24/2014     SLF:  1. 2 columns grade 2-3 varices- 2 Bands applied.  2.  Moderate gastropathy 3. Duodenal Diverticula  . Esophageal banding N/A 07/24/2014    Procedure: ESOPHAGEAL BANDING (2 bands applied);  Surgeon: Danie Binder, MD;  Location: [facility] ORS;  Service: Endoscopy;  Laterality: N/A;  . Central venous catheter insertion Right   . Esophagogastroduodenoscopy N/A 09/16/2014    Rehman: Single short column of varix proximal to GE junction not large enough to be banded. Two amall ulcers at the GEJ felt to be source of GI Bleeding but no active bleeding but no actibe bleeding noted. No therapy rendered. Portal gastropathy NO evidence of peptic ulcer diease or gastric varices.   . Esophagogastroduodenoscopy (egd) with propofol N/A 10/26/2014    RMR: 2 columns of grade 2 esophageal varices without obvious bleeding stigmata status post band ligation to complet obliteration of remaining varices.   . Av fistula placement Right 11/16/2014    Procedure: Right arm Creation of arteriovenous fistula;  Surgeon: Angelia Mould, MD;  Location: [facility] OR;  Service: Vascular;  Laterality: Right;  . Esophagogastroduodenoscopy N/A 12/14/2014    SLF: Grade ! esophageal varices. 2. Moderate Portal Gastropathy  . Esophagogastroduodenoscopy N/A 03/27/2015    Procedure: ESOPHAGOGASTRODUODENOSCOPY (EGD);  Surgeon: Danie Binder, MD;  Location: [facility];  Service: Endoscopy;  Laterality: N/A;  1045am - moved to 817 @ 11:30   Family History  Problem Relation Age of Onset  . Heart disease Mother   . Colon cancer Neg Hx   . Liver disease Neg Hx    Social History  Substance Use Topics  . Smoking status: Former Smoker -- 0.25 packs/day for 20 years    Types: Cigarettes    Quit date: 11/05/2008  . Smokeless tobacco: Never Used  . Alcohol Use: No   OB History    Gravida Para Term Preterm AB TAB SAB Ectopic Multiple Living   1 1 1       1      Review of Systems  Constitutional: Negative for fever.  Eyes: Negative for  visual disturbance.  Respiratory: Negative for shortness of breath.   Cardiovascular: Positive for leg swelling. Negative for chest pain.  Gastrointestinal: Negative for nausea, vomiting and abdominal pain.  Genitourinary: Negative for decreased urine volume and difficulty urinating.  Musculoskeletal: Positive for arthralgias ( B/L hand cramping).  Skin: Negative for rash.  Neurological: Negative for headaches.  All other systems reviewed and are negative.  Allergies  Lasix; Latex; and Morphine and related  Home Medications   Prior to Admission medications   Medication Sig Start Date End Date Taking? Authorizing Provider  lactulose (CHRONULAC) 10 GM/15ML solution Take 15 mLs (10 g total) by mouth 2 (two) times daily. Patient taking differently: Take 10 g by mouth daily as needed for mild constipation or moderate constipation.  02/22/15  Yes Mahala Menghini, PA-C  lidocaine (XYLOCAINE) 2 % solution TAKE TWO TEASPOONSFUL (10ML) BY MOUTH BEFORE MEALS AND AT BEDTIME TO PREVENT CHEST PAIN WHILE EATING 05/10/15  Yes Orvil Feil, NP  lidocaine-prilocaine (EMLA) cream Apply 1 application topically See admin instructions. Applied prior to port access 05/03/15  Yes Historical Provider, MD  LORazepam (ATIVAN) 1  MG tablet HALF TO 1 TAB PO TWICE DAILY FOR BACK SPASM OR ANXIETY Patient taking differently: Take 0.5-1 mg by mouth 2 (two) times daily as needed (FOR BACK SPASM OR ANXIETY). FOR BACK SPASM OR ANXIETY 11/14/14  Yes Danie Binder, MD  metolazone (ZAROXOLYN) 5 MG tablet Take 2 tablets (10 mg total) by mouth daily. 09/21/14  Yes Orvil Feil, NP  midodrine (PROAMATINE) 10 MG tablet Take 1 tablet (10 mg total) by mouth 2 (two) times daily with a meal. Medication to keep your blood pressure from falling. Patient taking differently: Take 10 mg by mouth daily. *Takes only on dialysis days on Tuesdays, Thursdays, and Saturdays (Sometimes takes treatments on Friday instead of Saturday)**Medication to keep your  blood pressure from falling. 04/27/14  Yes Rexene Alberts, MD  Oxycodone HCl 10 MG TABS 1 PO TID AS NEEDED FOR PAIN Patient taking differently: Take 10 mg by mouth 3 (three) times daily.  04/11/15  Yes Danie Binder, MD  pantoprazole (PROTONIX) 40 MG tablet Take 1 tablet (40 mg total) by mouth 2 (two) times daily. Patient taking differently: Take 40 mg by mouth daily as needed (for acid reflux/GERD).  09/19/14  Yes Kathie Dike, MD  promethazine (PHENERGAN) 12.5 MG tablet TAKE ONE TABLET BY MOUTH EVERY 6 HOURS AS NEEDED FOR NAUSEA AND VOMITING. 04/17/15  Yes Carlis Stable, NP  diazepam (VALIUM) 5 MG tablet Take 1 tablet (5 mg total) by mouth every 6 (six) hours as needed for muscle spasms (spasms). 05/14/15   Nirali Magouirk, MD   BP 104/62 mmHg  Pulse 102  Temp(Src) 98.4 F (36.9 C)  Resp 20  Ht 5\' 2"  (1.575 m)  Wt 130 lb 11.7 oz (59.3 kg)  BMI 23.91 kg/m2  SpO2 100%  LMP 09/19/2013 Physical Exam  Constitutional: She is oriented to person, place, and time. She appears well-developed and well-nourished. No distress.  HENT:  Head: Normocephalic.  Eyes: Conjunctivae are normal.  Neck: Normal range of motion. Neck supple.  Cardiovascular: Normal rate.   Pulmonary/Chest: Effort normal. No respiratory distress.  Abdominal: Soft. Bowel sounds are normal. She exhibits distension. There is no tenderness.  Abdominal distention with fluid wave.  Musculoskeletal: Normal range of motion. She exhibits edema and tenderness.  Left leg; 1+ edema to left knee, right leg has 1+ edema to mid-shin; holding fingers in extended position but is able to hold and grip; tenderness to BUE and BLE muscles.   Neurological: She is alert and oriented to person, place, and time. Coordination normal.  Skin: Skin is warm.  Psychiatric: She has a normal mood and affect. Her behavior is normal.  Nursing note and vitals reviewed.   ED Course  Procedures  DIAGNOSTIC STUDIES: Oxygen Saturation is 100% on RA, normal by my  interpretation.    COORDINATION OF CARE: 8:31 PM Discussed treatment plan with pt at bedside and pt agreed to plan.   Labs Review Labs Reviewed  BASIC METABOLIC PANEL - Abnormal; Notable for the following:    Chloride 100 (*)    Glucose, Bld 102 (*)    BUN 27 (*)    Creatinine, Ser 2.28 (*)    Calcium 8.1 (*)    GFR calc non Af Amer 25 (*)    GFR calc Af Amer 30 (*)    All other components within normal limits  I-STAT CHEM 8, ED - Abnormal; Notable for the following:    Chloride 98 (*)    BUN 26 (*)  Creatinine, Ser 2.30 (*)    Glucose, Bld 102 (*)    Calcium, Ion 1.10 (*)    Hemoglobin 11.6 (*)    HCT 34.0 (*)    All other components within normal limits  MAGNESIUM   I have personally reviewed and evaluated these lab results as part of my medical decision-making.   MDM   Final diagnoses:  Cramping of hands   Patient with cramping after dialysis. Electrolytes are normal. Symptoms improved with a dose of Valium. The patient is likely slightly dehydrated as she has decreased Vascular volume is seems that she has more edema. Patient's symptoms improved at time of discharge. Prescription given for the same medications. Will return here for any new or worsening symptoms.  I have personally and contemperaneously reviewed labs and imaging and used in my decision making as above.   A medical screening exam was performed and I feel the patient has had an appropriate workup for their chief complaint at this time and likelihood of emergent condition existing is low. They have been counseled on decision, discharge, follow up and which symptoms necessitate immediate return to the emergency department. They or their family verbally stated understanding and agreement with plan and discharged in stable condition.   I personally performed the services described in this documentation, which was scribed in my presence. The recorded information has been reviewed and is accurate.    Merrily Pew, MD 05/14/15 [phone]

## 2015-05-14 NOTE — ED Notes (Signed)
Pt reporting cramping in hands following dialysis treatment today.  Pt states that she did have her full treatment today, and thinks between 4-5 liters of fluid were removed.  Pt unsure of dry weight or how far above she remains.

## 2015-05-28 ENCOUNTER — Telehealth: Payer: Self-pay

## 2015-05-28 ENCOUNTER — Telehealth: Payer: Self-pay | Admitting: Gastroenterology

## 2015-05-28 ENCOUNTER — Other Ambulatory Visit: Payer: Self-pay

## 2015-05-28 DIAGNOSIS — R188 Other ascites: Secondary | ICD-10-CM

## 2015-05-28 IMAGING — US US PARACENTESIS
1 series · 2 of 2 positions shown · non-contrast
Comparison: 10/03/2014

CLINICAL DATA: Cirrhosis, ascites

EXAM:
ULTRASOUND GUIDED THERAPEUTIC PARACENTESIS

[Series 1: us paracentesis · 0.20mm/px · 2 of 2 slices shown]
[im 1/2]
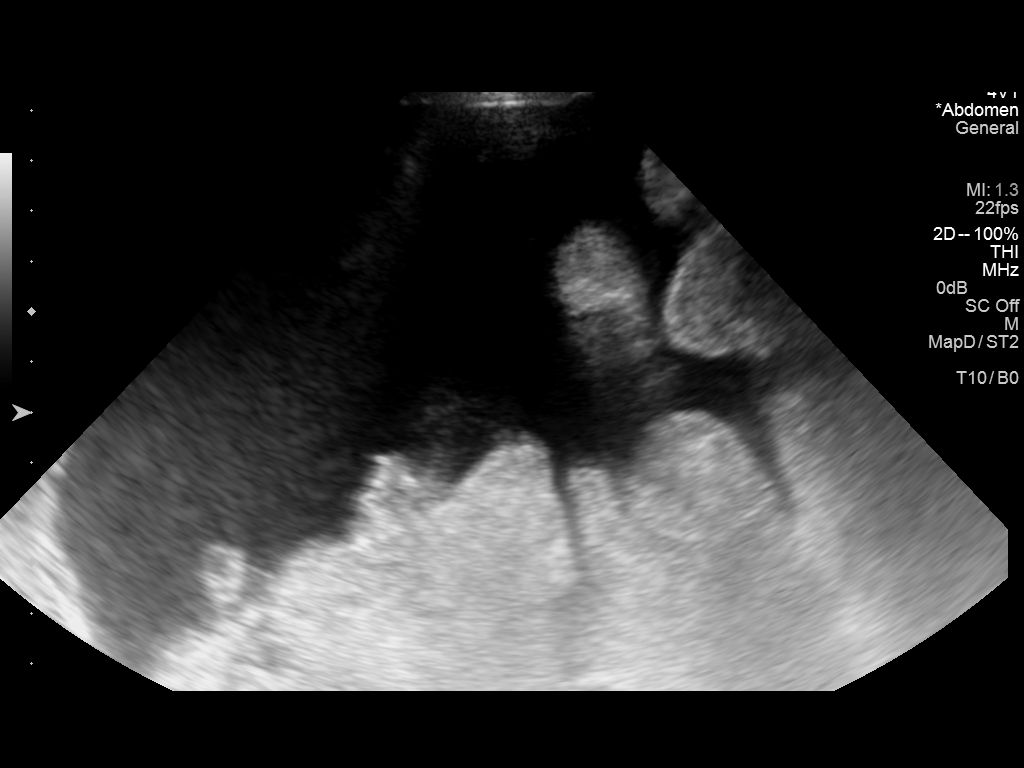
[im 2/2]
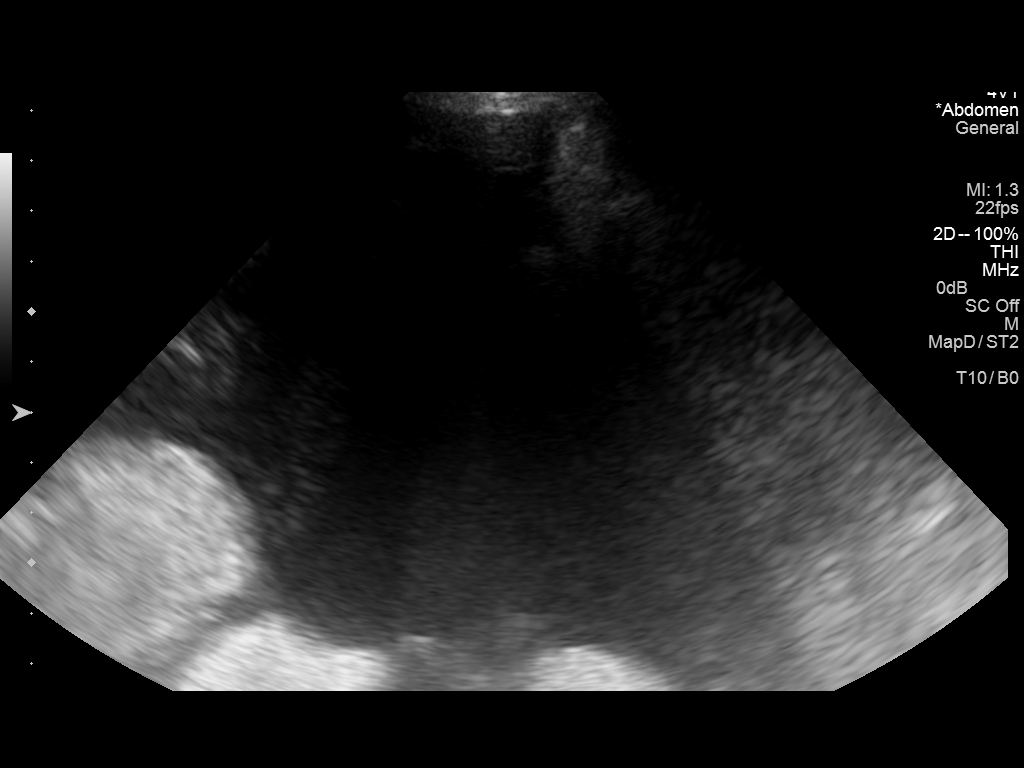

[2 of 2 positions shown; findings below may reference images not displayed]

PROCEDURE:
Procedure, benefits, and risks of procedure were discussed with
patient.

Written informed consent for procedure was obtained.

Time out protocol followed.

Procedure was performed portably.

Adequate collection of ascites localized by ultrasound in RIGHT
lower quadrant.

Skin prepped and draped in usual sterile fashion.

Skin and soft tissues anesthetized with 10 mL of 1% lidocaine.

5 French Yueh catheter placed into peritoneal cavity.

8866 mL of yellow colored ascites aspirated by vacuum bottle
suction.

Procedure tolerated well by patient without immediate complication.
FINDINGS: As above
IMPRESSION: Successful ultrasound guided paracentesis yielding 8866 mL of
ascites.

## 2015-05-28 IMAGING — CR DG CHEST 1V
1 series · 1 of 1 positions shown · non-contrast
Comparison: Chest radiograph performed 07/04/2014

CLINICAL DATA: Endotracheal tube placement.  Initial encounter.

EXAM:
CHEST  1 VIEW

[erect ap]
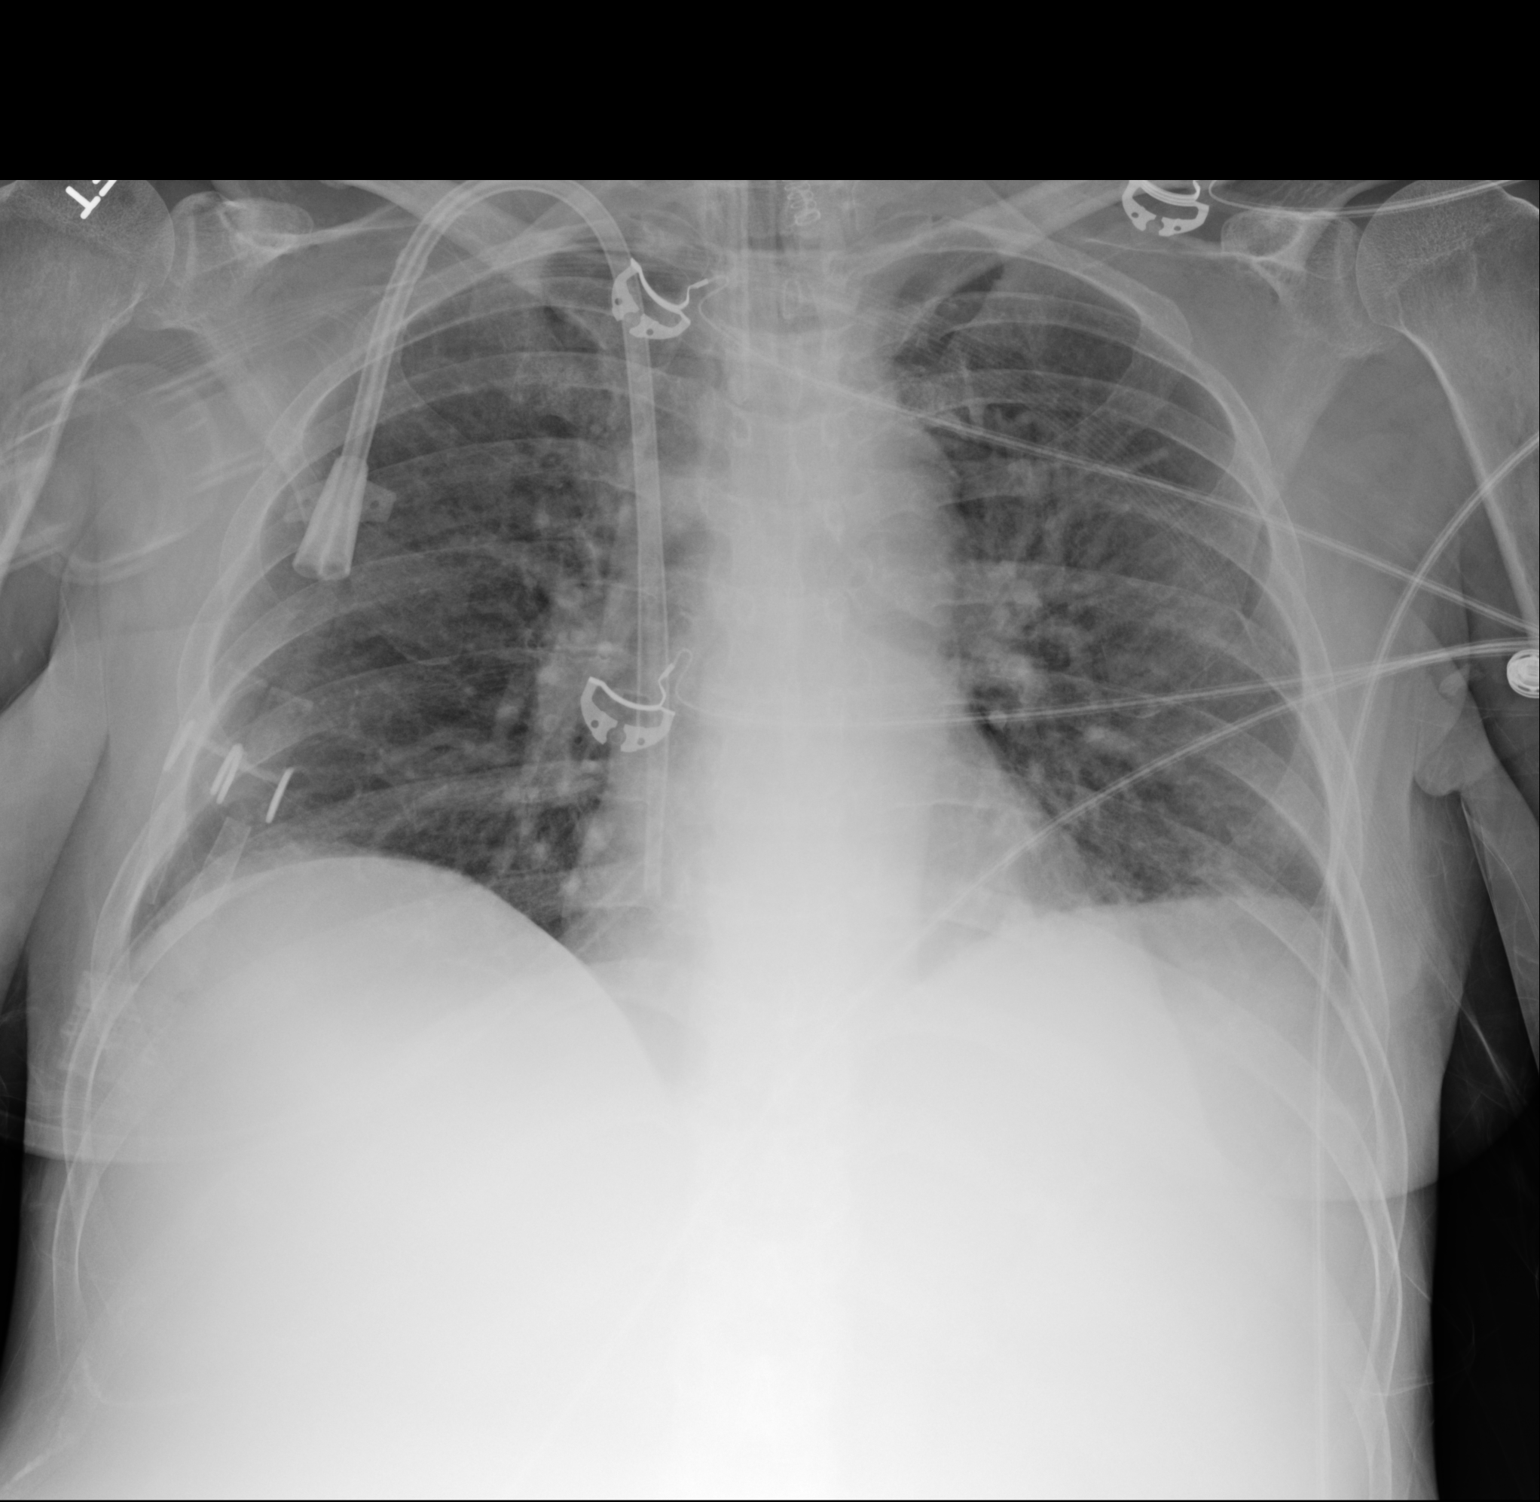

[1 of 1 positions shown; findings below may reference images not displayed]

FINDINGS: The patient's endotracheal tube is seen ending 2 cm above the
carina. The right IJ dual-lumen catheter is seen ending within the
right atrium.

The lungs are well-aerated. Vascular congestion is noted, with
mildly increased interstitial markings, possibly reflecting mild
interstitial edema. There is no evidence of pleural effusion or
pneumothorax.

The cardiomediastinal silhouette is within normal limits. No acute
osseous abnormalities are seen.
IMPRESSION: 1. Endotracheal tube seen ending 2 cm above the carina.
2. Vascular congestion, with mildly increased interstitial markings,
possibly reflecting mild interstitial edema.

## 2015-05-28 NOTE — Telephone Encounter (Signed)
Ginger is checking to see if orders were already entered.

## 2015-05-28 NOTE — Telephone Encounter (Signed)
Jeb (boyfriend) called again to check if [facility] had done the orders for the patient to have a para. She is having dialysis Thursday and will need fluid drawn before then. Please advise and call patient or boyfriend back at [phone]

## 2015-05-28 NOTE — Telephone Encounter (Signed)
Pt is set up for [place] PARA in the morning at 10:30. [name] is aware. Standing orders are in for her

## 2015-05-28 NOTE — Telephone Encounter (Signed)
Standing orders for u/s guided LVAP for six months from today's date.  _send fluid for cell count/diff, culture each tap _Give albumin 25 grams IV after first 4 liters of fluid removed, give additional albumin 12.5 grams IV for each additional 2 liters removed.

## 2015-05-28 NOTE — Telephone Encounter (Signed)
Please see other phone note

## 2015-05-28 NOTE — Telephone Encounter (Signed)
Pt is wanting to have an [place] PARA tomorrow. We don't have anymore standing orders for her. Please advise

## 2015-05-29 ENCOUNTER — Encounter (HOSPITAL_COMMUNITY): Payer: Self-pay

## 2015-05-29 ENCOUNTER — Ambulatory Visit (HOSPITAL_COMMUNITY)
Admission: RE | Admit: 2015-05-29 | Discharge: 2015-05-29 | Disposition: A | Discharge status: Home / Self Care | Payer: Medicare Other | Source: Ambulatory Visit | Attending: Gastroenterology | Admitting: Gastroenterology

## 2015-05-29 DIAGNOSIS — R188 Other ascites: Secondary | ICD-10-CM | POA: Insufficient documentation

## 2015-05-29 MED ORDER — ALBUMIN HUMAN 25 % IV SOLN
INTRAVENOUS | Status: AC
  Administered 2015-05-29: 25 g
  Filled 2015-05-29: qty 100

## 2015-05-29 NOTE — Telephone Encounter (Signed)
REVIEWED-NO ADDITIONAL RECOMMENDATIONS. 

## 2015-05-29 NOTE — Telephone Encounter (Signed)
REVIEWED.  

## 2015-06-02 IMAGING — US US PARACENTESIS
1 series · 5 of 5 positions shown · non-contrast
Comparison: None.

CLINICAL DATA: Cirrhosis, ascites

EXAM:
ULTRASOUND GUIDED DIAGNOSTIC AND THERAPEUTIC PARACENTESIS

[Series 1: us paracentesis · 0.18mm/px · 5 of 5 slices shown]
[im 1/5]
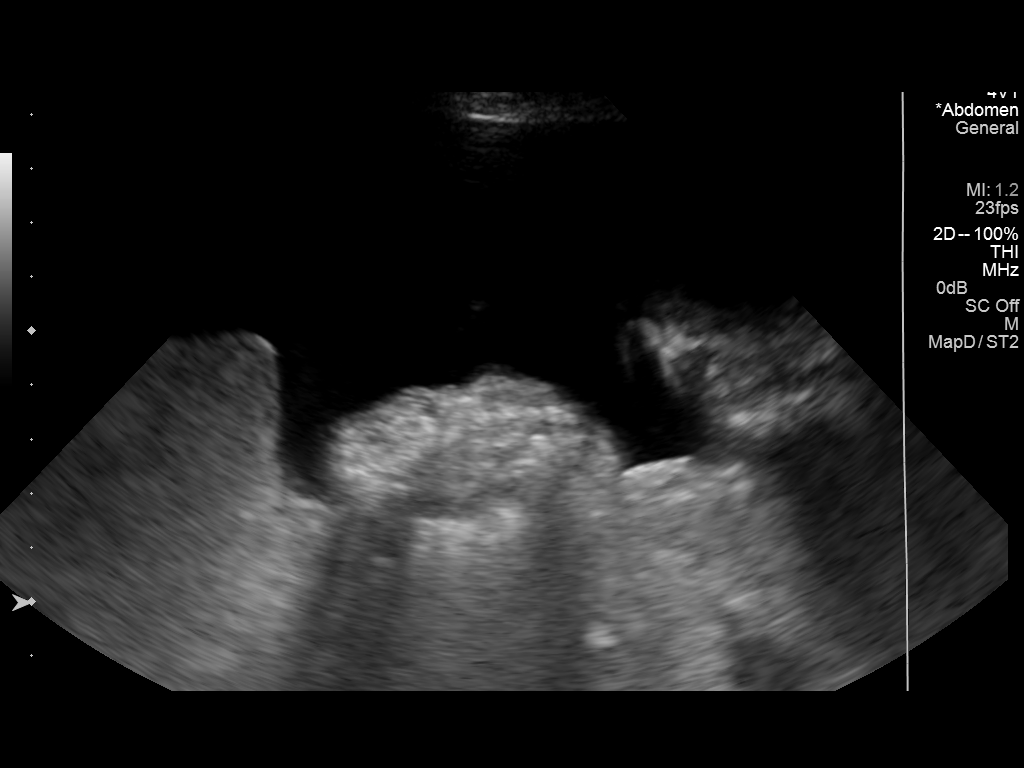
[im 2/5]
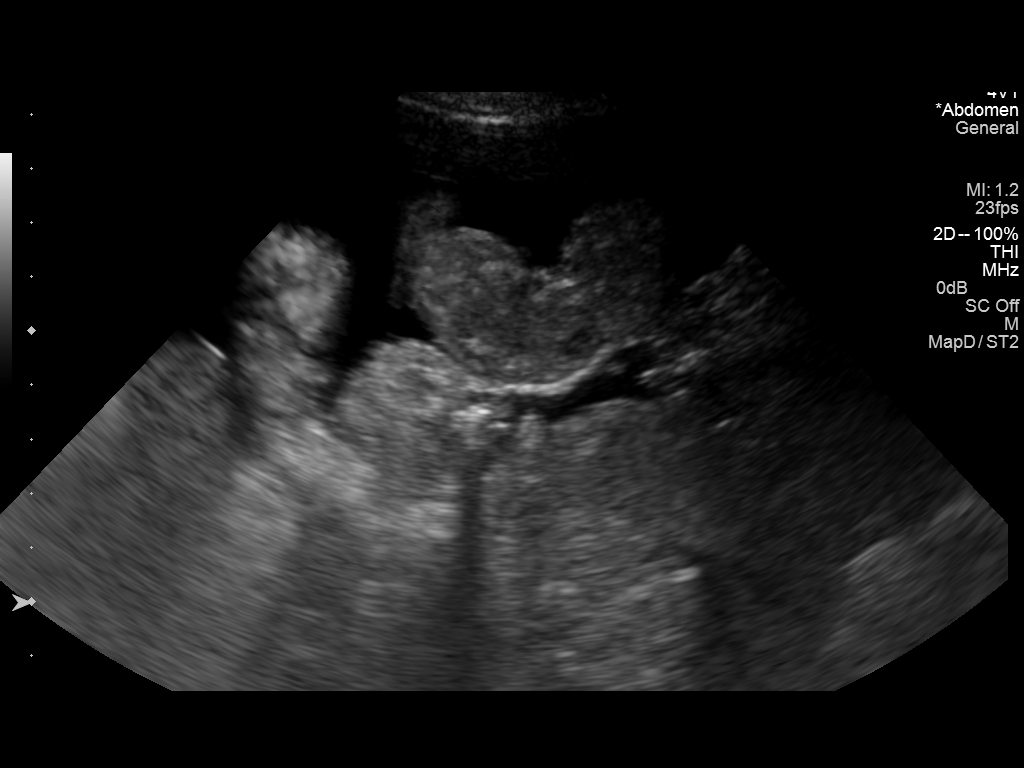
[im 3/5]
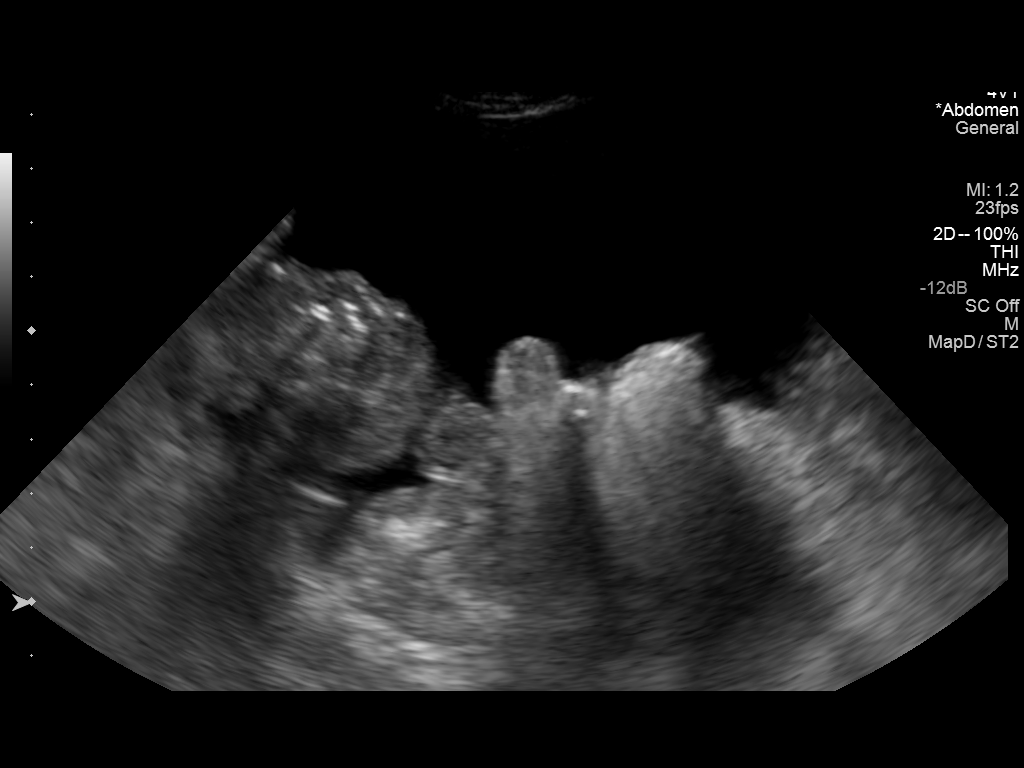
[im 4/5]
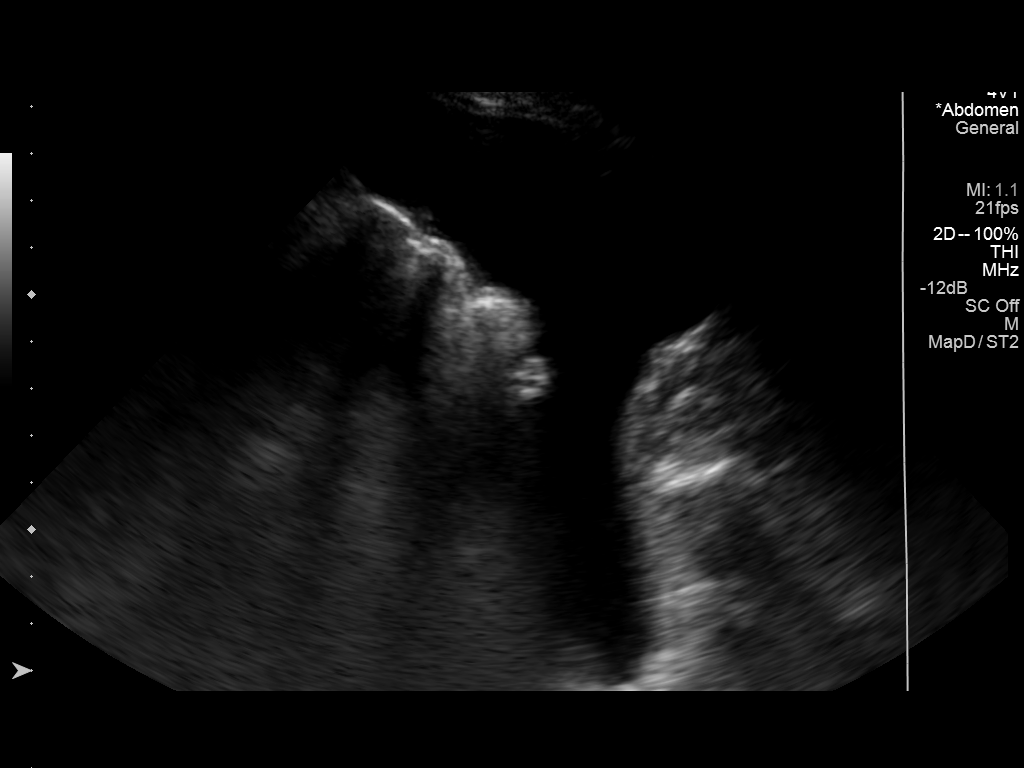
[im 5/5]
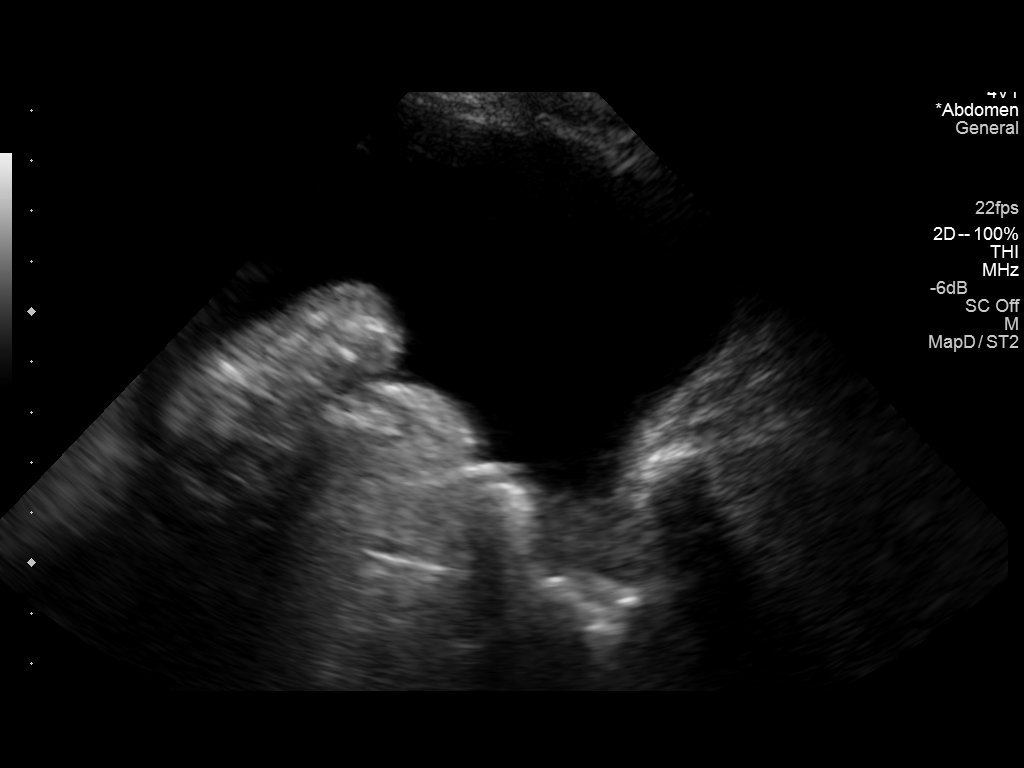

[5 of 5 positions shown; findings below may reference images not displayed]

PROCEDURE:
Procedure, benefits, and risks of procedure were discussed with
patient.

Written informed consent for procedure was obtained.

Time out protocol followed.

Adequate collection of ascites localized by ultrasound in RIGHT
lower quadrant.

Skin prepped and draped in usual sterile fashion.

Skin and soft tissues anesthetized with 10 mL of 1% lidocaine.

5 French Yueh catheter placed into peritoneal cavity.

0000 mL of amber color fluid aspirated by vacuum bottle suction.

Procedure tolerated well by patient without immediate complication.
FINDINGS: A total of approximately 0000 mL of ascitic fluid was removed. A
fluid sample of 180 mL was sent for laboratory analysis.
IMPRESSION: Successful ultrasound guided paracentesis yielding 0000 mL of
ascites.

## 2015-06-04 ENCOUNTER — Telehealth: Payer: Self-pay | Admitting: Gastroenterology

## 2015-06-04 ENCOUNTER — Inpatient Hospital Stay (HOSPITAL_COMMUNITY)
Admission: AD | Admit: 2015-06-04 | Discharge: 2015-06-09 | DRG: 432 | Disposition: A | Discharge status: Home / Self Care | Payer: Medicare Other | Source: Other Acute Inpatient Hospital | Attending: Internal Medicine | Admitting: Internal Medicine

## 2015-06-04 DIAGNOSIS — K922 Gastrointestinal hemorrhage, unspecified: Secondary | ICD-10-CM | POA: Diagnosis present

## 2015-06-04 DIAGNOSIS — E873 Alkalosis: Secondary | ICD-10-CM | POA: Diagnosis present

## 2015-06-04 DIAGNOSIS — D62 Acute posthemorrhagic anemia: Secondary | ICD-10-CM | POA: Diagnosis present

## 2015-06-04 DIAGNOSIS — I959 Hypotension, unspecified: Secondary | ICD-10-CM | POA: Diagnosis present

## 2015-06-04 DIAGNOSIS — K7290 Hepatic failure, unspecified without coma: Secondary | ICD-10-CM | POA: Diagnosis present

## 2015-06-04 DIAGNOSIS — K3189 Other diseases of stomach and duodenum: Secondary | ICD-10-CM | POA: Diagnosis present

## 2015-06-04 DIAGNOSIS — K766 Portal hypertension: Secondary | ICD-10-CM | POA: Diagnosis present

## 2015-06-04 DIAGNOSIS — R55 Syncope and collapse: Secondary | ICD-10-CM | POA: Diagnosis present

## 2015-06-04 DIAGNOSIS — R579 Shock, unspecified: Secondary | ICD-10-CM | POA: Diagnosis not present

## 2015-06-04 DIAGNOSIS — K7460 Unspecified cirrhosis of liver: Secondary | ICD-10-CM | POA: Diagnosis present

## 2015-06-04 DIAGNOSIS — F418 Other specified anxiety disorders: Secondary | ICD-10-CM | POA: Diagnosis present

## 2015-06-04 DIAGNOSIS — Z992 Dependence on renal dialysis: Secondary | ICD-10-CM | POA: Diagnosis not present

## 2015-06-04 DIAGNOSIS — Z87891 Personal history of nicotine dependence: Secondary | ICD-10-CM | POA: Diagnosis not present

## 2015-06-04 DIAGNOSIS — K921 Melena: Secondary | ICD-10-CM

## 2015-06-04 DIAGNOSIS — Z9119 Patient's noncompliance with other medical treatment and regimen: Secondary | ICD-10-CM

## 2015-06-04 DIAGNOSIS — K7581 Nonalcoholic steatohepatitis (NASH): Secondary | ICD-10-CM | POA: Diagnosis present

## 2015-06-04 DIAGNOSIS — D631 Anemia in chronic kidney disease: Secondary | ICD-10-CM | POA: Diagnosis present

## 2015-06-04 DIAGNOSIS — I8501 Esophageal varices with bleeding: Secondary | ICD-10-CM | POA: Diagnosis not present

## 2015-06-04 DIAGNOSIS — G9341 Metabolic encephalopathy: Secondary | ICD-10-CM | POA: Diagnosis present

## 2015-06-04 DIAGNOSIS — I953 Hypotension of hemodialysis: Secondary | ICD-10-CM | POA: Diagnosis present

## 2015-06-04 DIAGNOSIS — D696 Thrombocytopenia, unspecified: Secondary | ICD-10-CM | POA: Diagnosis present

## 2015-06-04 DIAGNOSIS — K7469 Other cirrhosis of liver: Secondary | ICD-10-CM | POA: Diagnosis present

## 2015-06-04 DIAGNOSIS — K7682 Hepatic encephalopathy: Secondary | ICD-10-CM | POA: Diagnosis present

## 2015-06-04 DIAGNOSIS — R571 Hypovolemic shock: Secondary | ICD-10-CM | POA: Diagnosis present

## 2015-06-04 DIAGNOSIS — R578 Other shock: Secondary | ICD-10-CM | POA: Diagnosis present

## 2015-06-04 DIAGNOSIS — K219 Gastro-esophageal reflux disease without esophagitis: Secondary | ICD-10-CM | POA: Diagnosis present

## 2015-06-04 DIAGNOSIS — N186 End stage renal disease: Secondary | ICD-10-CM | POA: Diagnosis present

## 2015-06-04 DIAGNOSIS — F1210 Cannabis abuse, uncomplicated: Secondary | ICD-10-CM | POA: Diagnosis present

## 2015-06-04 DIAGNOSIS — I864 Gastric varices: Secondary | ICD-10-CM | POA: Diagnosis present

## 2015-06-04 DIAGNOSIS — F419 Anxiety disorder, unspecified: Secondary | ICD-10-CM | POA: Diagnosis present

## 2015-06-04 DIAGNOSIS — I8511 Secondary esophageal varices with bleeding: Secondary | ICD-10-CM | POA: Diagnosis present

## 2015-06-04 LAB — CBC
HEMATOCRIT: 20.7 % — AB (ref 36.0–46.0)
HEMOGLOBIN: 6.8 g/dL — AB (ref 12.0–15.0)
MCH: 31.5 pg (ref 26.0–34.0)
MCHC: 32.9 g/dL (ref 30.0–36.0)
MCV: 95.8 fL (ref 78.0–100.0)
PLATELETS: 117 10*3/uL — AB (ref 150–400)
RBC: 2.16 MIL/uL — AB (ref 3.87–5.11)
RDW: 18.7 % — ABNORMAL HIGH (ref 11.5–15.5)
WBC: 8.2 10*3/uL (ref 4.0–10.5)

## 2015-06-04 LAB — POCT I-STAT 3, ART BLOOD GAS (G3+)
Acid-Base Excess: 10 mmol/L — ABNORMAL HIGH (ref 0.0–2.0)
BICARBONATE: 30.2 meq/L — AB (ref 20.0–24.0)
O2 Saturation: 100 %
PCO2 ART: 24.9 mmHg — AB (ref 35.0–45.0)
TCO2: 31 mmol/L (ref 0–100)
pH, Arterial: 7.693 (ref 7.350–7.450)
pO2, Arterial: 155 mmHg — ABNORMAL HIGH (ref 80.0–100.0)

## 2015-06-04 LAB — PREPARE RBC (CROSSMATCH)

## 2015-06-04 LAB — PROTIME-INR
INR: 1.29 (ref 0.00–1.49)
Prothrombin Time: 16.2 seconds — ABNORMAL HIGH (ref 11.6–15.2)

## 2015-06-04 LAB — GLUCOSE, CAPILLARY: GLUCOSE-CAPILLARY: 86 mg/dL (ref 65–99)

## 2015-06-04 MED ORDER — SODIUM CHLORIDE 0.9 % IV SOLN
80.0000 mg | Freq: Once | INTRAVENOUS | Status: AC
  Administered 2015-06-05: 80 mg via INTRAVENOUS
  Filled 2015-06-04: qty 80

## 2015-06-04 MED ORDER — PANTOPRAZOLE SODIUM 40 MG IV SOLR: 40.0000 mg | Freq: Two times a day (BID) | INTRAVENOUS | Status: DC

## 2015-06-04 MED ORDER — PANTOPRAZOLE SODIUM 40 MG IV SOLR: 40.0000 mg | Freq: Once | INTRAVENOUS | Status: DC

## 2015-06-04 MED ORDER — SODIUM CHLORIDE 0.9 % IV SOLN
Freq: Once | INTRAVENOUS | Status: AC
  Administered 2015-06-04: via INTRAVENOUS

## 2015-06-04 MED ORDER — SODIUM CHLORIDE 0.9 % IV SOLN
50.0000 ug/h | INTRAVENOUS | Status: DC
  Administered 2015-06-05 – 2015-06-07 (×7): 50 ug/h via INTRAVENOUS
  Filled 2015-06-04 (×11): qty 1

## 2015-06-04 MED ORDER — SODIUM CHLORIDE 0.9 % IV SOLN
8.0000 mg/h | INTRAVENOUS | Status: DC
  Administered 2015-06-05 – 2015-06-07 (×5): 8 mg/h via INTRAVENOUS
  Filled 2015-06-04 (×11): qty 80

## 2015-06-04 MED ORDER — SODIUM CHLORIDE 0.9 % IV SOLN
INTRAVENOUS | Status: DC
  Administered 2015-06-05: via INTRAVENOUS

## 2015-06-04 MED ORDER — SODIUM CHLORIDE 0.9 % IV SOLN: 250.0000 mL | INTRAVENOUS | Status: DC | PRN

## 2015-06-04 NOTE — Telephone Encounter (Signed)
Sending as an Pharmacist, hospital to Neil Crouch, PA, also.

## 2015-06-04 NOTE — Progress Notes (Signed)
Nursing Note: Patient received from [name] via [facility] transport.  Patient transferred to ICU bed and connected to ICU monitoring as ordered.  CHG Bath given and MRSA nasal swab obtained.  VSS HR 98, SaO2 98% on room air, RR 18 even and unlabored, and BP 108/45.  CCM Georgann Housekeeper, NP at bedside for initial assessment.  E-Link notified of admission along with Ht 5F2I and Weight 58.8kg.

## 2015-06-04 NOTE — Procedures (Signed)
Arterial Catheter Insertion Procedure Note Wanda [name] [id number] 05-Oct-1975  Procedure: Insertion of Arterial Catheter  Indications: Blood pressure monitoring and Frequent blood sampling  Procedure Details Consent: Risks of procedure as well as the alternatives and risks of each were explained to the (patient/caregiver).  Consent for procedure obtained. Time Out: Verified patient identification, verified procedure, site/side was marked, verified correct patient position, special equipment/implants available, medications/allergies/relevent history reviewed, required imaging and test results available.  Performed  Maximum sterile technique was used including antiseptics, cap, gloves, gown, hand hygiene, mask and sheet. Skin prep: Chlorhexidine; local anesthetic administered 20 gauge catheter was inserted into left radial artery using the Seldinger technique.  Evaluation Blood flow good; BP tracing good. Complications: No apparent complications.   Wynelle Beckmann 06/04/2015

## 2015-06-04 NOTE — Telephone Encounter (Signed)
Jeb (boyfriend) called to let us know that Wanda [name] went to dialysis today and her blood pressure was running low 95/43 and continued to drop to 80/30. Nurse at dialysis was unable to wake patient up and called 911. Patient is now at [facility] ED and boyfriend said blood pressure was still running around 90/50.  He wanted to let us know what was going on.  Jeb can be reached on his cell phone [phone]

## 2015-06-04 NOTE — Telephone Encounter (Signed)
REVIEWED-NO ADDITIONAL RECOMMENDATIONS. 

## 2015-06-04 NOTE — H&P (Signed)
PULMONARY / CRITICAL CARE MEDICINE   Name: Wanda [name] MRN: [id number] DOB: October 05, 1975    ADMISSION DATE:  06/04/2015 CONSULTATION DATE:  06/04/2015  REFERRING MD :  [place]:  Syncope  INITIAL PRESENTATION: 39 year old female with history of cirrhosis and ESRD on HD suffered a syncopal episode while dialysis with systolic blood pressure noted to be 60s. In Emergency department she unresponsive and was found to be anemic with hemoglobin 5.1 requiring 2 units PRBC transfusion. She was transported to [facility] for further evaluation.   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 She also has history of anxiety depression and bipolar disorder. PMH also includes chronic hypotension with systolic blood pressure around 100.She was being considered for liver transplant at [facility], however, she had been not compliant with her appointments including imaging social worker appointments and psychology appointments, also positive UDS for cannabis, is unclear whether or not this is still an option for her. 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   06/04/2015 she was in her usual state of health on the time present for dialysis. Very shortly after her dialysis treatment she developed syncope associated with hypotension. She was transported to [facility] emergency department and found to be hypotensive with systolic blood pressures in the 60s. She was also found to be anemic with  hemoglobin 5.1 requiring 2 units PRBC transfusion. She was essentially unresponsive time of presentation however after blood transfusion her mental status has improved. Neurology Lovie Macadamia was contacted and felt as if the patient would benefit from higher level medical care of [place] boyfriend states that she is had multiple episodes of GI bleeding requiring blood transfusion already this year. He also notes that she experienced black stools last night. Currently she has no complaints of chest pain or shortness of breath, or dizziness. She does complain of some diffuse abdominal pain which is better chronic problem over the past few months, however, I suspect she is a poor historian given her current illness.  PAST MEDICAL HISTORY :   has a past medical history of GERD (gastroesophageal reflux disease); Cirrhosis ([place]) (10/05/13); Folate deficiency (09/2013); Anasarca (10/10/2013); Hematemesis/vomiting blood (02/24/2014); Acute blood loss anemia (02/25/2014); Macrocytosis (02/28/2014); Bleeding esophageal varices ([place]) (02/28/2014); Acute renal failure ([place]) (09/2013); C. difficile colitis (04/19/2014); Anxiety; Depression; Thrombocytopenia ([place]); Cirrhosis of liver with ascites ([place]); SBP (spontaneous bacterial peritonitis) ([place]) (11/10/2013); Bipolar disorder ([place]) (12/04/2013); PNA (pneumonia) (10/13/2013); Chronic hypotension; Gastroesophageal junction ulcer (09/17/2014); and ESRD (end stage renal disease) on dialysis ([place]) (08/2014).  has past surgical history that includes None; Paracentesis (Feb 2015); Esophagogastroduodenoscopy (N/A, 11/14/2013); Paracentesis (10/2013); Colonoscopy (N/A, 12/19/2013); Esophagogastroduodenoscopy (N/A, 02/11/2014); Esophagogastroduodenoscopy (N/A, 07/04/2014); esophageal banding (07/04/2014); Esophagogastroduodenoscopy (egd) with propofol (N/A, 07/24/2014); esophageal banding (N/A, 07/24/2014); Central venous catheter insertion (Right); Esophagogastroduodenoscopy (N/A,  09/16/2014); Esophagogastroduodenoscopy (egd) with propofol (N/A, 10/26/2014); AV fistula placement (Right, 11/16/2014); Esophagogastroduodenoscopy (N/A, 12/14/2014); and Esophagogastroduodenoscopy (N/A, 03/27/2015). Prior to Admission medications   Medication Sig Start Date End Date Taking? Authorizing Provider  diazepam (VALIUM) 5  MG tablet Take 1 tablet (5 mg total) by mouth every 6 (six) hours as needed for muscle spasms (spasms). 05/14/15   Merrily Pew, MD  lactulose (CHRONULAC) 10 GM/15ML solution Take 15 mLs (10 g total) by mouth 2 (two) times daily. Patient taking differently: Take 10 g by mouth daily as needed for mild constipation or moderate constipation.  02/22/15   Mahala Menghini, PA-C  lidocaine (XYLOCAINE) 2 % solution TAKE TWO TEASPOONSFUL (10ML) BY MOUTH BEFORE MEALS AND AT BEDTIME TO PREVENT CHEST PAIN WHILE EATING 05/10/15   Orvil Feil, NP  lidocaine-prilocaine (EMLA) cream Apply 1 application topically See admin instructions. Applied prior to port access 05/03/15   Historical Provider, MD  LORazepam (ATIVAN) 1 MG tablet HALF TO 1 TAB PO TWICE DAILY FOR BACK SPASM OR ANXIETY Patient taking differently: Take 0.5-1 mg by mouth 2 (two) times daily as needed (FOR BACK SPASM OR ANXIETY). FOR BACK SPASM OR ANXIETY 11/14/14   Danie Binder, MD  metolazone (ZAROXOLYN) 5 MG tablet Take 2 tablets (10 mg total) by mouth daily. 09/21/14   Orvil Feil, NP  midodrine (PROAMATINE) 10 MG tablet Take 1 tablet (10 mg total) by mouth 2 (two) times daily with a meal. Medication to keep your blood pressure from falling. Patient taking differently: Take 10 mg by mouth daily. *Takes only on dialysis days on Tuesdays, Thursdays, and Saturdays (Sometimes takes treatments on Friday instead of Saturday)**Medication to keep your blood pressure from falling. 04/27/14   Rexene Alberts, MD  Oxycodone HCl 10 MG TABS 1 PO TID AS NEEDED FOR PAIN Patient taking differently: Take 10 mg by mouth 3 (three) times daily.  04/11/15   Danie Binder, MD  pantoprazole (PROTONIX) 40 MG tablet Take 1 tablet (40 mg total) by mouth 2 (two) times daily. Patient taking differently: Take 40 mg by mouth daily as needed (for acid reflux/GERD).  09/19/14   Kathie Dike, MD  promethazine (PHENERGAN) 12.5 MG tablet TAKE ONE TABLET BY MOUTH EVERY 6 HOURS AS NEEDED FOR NAUSEA AND VOMITING. 04/17/15   Carlis Stable, NP   Allergies  Allergen Reactions  . Lasix [Furosemide]     "doesn't work"  . Latex Itching  . Morphine And Related Hives and Itching    FAMILY HISTORY:  indicated that her mother is deceased. She indicated that her father is deceased. She indicated that her sister is alive. She indicated that her brother is alive.  SOCIAL HISTORY:  reports that she quit smoking about 6 years ago. Her smoking use included Cigarettes. She has a 5 pack-year smoking history. She has never used smokeless tobacco. She reports that she does not drink alcohol or use illicit drugs.  REVIEW OF SYSTEMS:  Somewhat limited due to confusion Bolds are positive  Constitutional: weight loss, gain, night sweats, Fevers, chills, fatigue .  HEENT: headaches, Sore throat, sneezing, nasal congestion, post nasal drip, Difficulty swallowing, Tooth/dental problems, visual complaints visual changes, ear ache CV:  chest pain, radiates: ,Orthopnea, PND, swelling in lower extremities, dizziness, palpitations, syncope.  GI  heartburn, indigestion, abdominal pain, nausea, vomiting, diarrhea, change in bowel habits, loss of appetite, dark stools. Resp: cough, productive: , hemoptysis, dyspnea, chest pain, pleuritic.  Skin: rash or itching or icterus GU: dysuria, change in color of urine, urgency or frequency. flank pain, hematuria  MS: joint pain or swelling. decreased range of motion  Psych: change in mood or affect. depression or anxiety.  Neuro: difficulty with speech, weakness, numbness, ataxia  SUBJECTIVE:   VITAL SIGNS: Pulse Rate:  [99] 99 (10/25 2145) Resp:   [19] 19 (10/25 2145) BP: (108)/(45) 108/45 mmHg (10/25 2145) SpO2:  [99 %] 99 % (10/25 2145) HEMODYNAMICS:   VENTILATOR SETTINGS:   INTAKE / OUTPUT: No intake or output data in the 24 hours ending 06/04/15 2207  PHYSICAL EXAMINATION: General:  Acutely ill appearing female in NAD Neuro:  Alert, oriented x 3, some confusion and slow to respond HEENT:  [place]/AT, no JVD, PERRL Cardiovascular:  Tachy, regular, no MRG. BLE +2 edema.  Lungs:  Clear bilateral breath sounds, unlabored Abdomen:  Soft, diffusely tender, ascites with umbilical protrusion.  Musculoskeletal:  No acute deformity or ROM limitation. Skin:  Pallor, mild jaundice  LABS:  CBC No results for input(s): WBC, HGB, HCT, PLT in the last 168 hours. Coag's No results for input(s): APTT, INR in the last 168 hours. BMET No results for input(s): NA, K, CL, CO2, BUN, CREATININE, GLUCOSE in the last 168 hours. Electrolytes No results for input(s): CALCIUM, MG, PHOS in the last 168 hours. Sepsis Markers No results for input(s): LATICACIDVEN, PROCALCITON, O2SATVEN in the last 168 hours. ABG No results for input(s): PHART, PCO2ART, PO2ART in the last 168 hours. Liver Enzymes No results for input(s): AST, ALT, ALKPHOS, BILITOT, ALBUMIN in the last 168 hours. Cardiac Enzymes No results for input(s): TROPONINI, PROBNP in the last 168 hours. Glucose  Recent Labs Lab 06/04/15 2148  GLUCAP 86    Imaging No results found.   ASSESSMENT / PLAN:  PULMONARY A: No acute issues  P:   Supplemental O2 as needed PRN  CARDIOVASCULAR A:  Hemorrhagic/hypovolemic shock > improved after PRBC  P:  Tele EKG Volume/Blood product resuscitation as below Check lactic Trend troponin Place arterial line as NIBP measurements inconsistent with cuff on leg Consider albumin  RENAL A:   ESRD on HD TTS Pseudo hypocalcemia (corrects to 9.1) Metabolic alkalosis (asa neg at [facility])  P:   Consult nephrology in AM Not overloaded  on exam STAT Bmet  GASTROINTESTINAL A:   Acute GI bleeding (hemoccult positive at [facility]) H/o Varices Cirrhosis secondary to NASH  P:   Protonix infusion Octreotide infusion with history of varices  Check hemoccult NPO Consult GI in AM  HEMATOLOGIC A:   Acute blood loss on chronic macrocytic anemia Mild thrombocytopenia  P:  S/p 2 units PRBC STAT CBC H&H q 4 hours  INFECTIOUS A:   No acute issues  P:   Follow WBC and fever curve If concern for infection develops would treat SBP in setting cirrhosis with ascites Consider paracentesis  ENDOCRINE A:   No acute issues  P:   Follow glucose on Bmet  NEUROLOGIC A:   Acute metabolic vs hepatic encephalopathy  P:   RASS goal: 0 Monitor Ammonia Resume home lactulose once GIB addressed.    FAMILY  - Updates:   - Inter-disciplinary family meet or Palliative Care meeting due by:  11/2   Georgann Housekeeper, AGACNP-BC [facility] Pager [phone] or [phone]  06/05/2015 12:49 AM

## 2015-06-05 ENCOUNTER — Telehealth: Payer: Self-pay | Admitting: Gastroenterology

## 2015-06-05 ENCOUNTER — Encounter (HOSPITAL_COMMUNITY)
Admission: AD | Disposition: A | Discharge status: Home / Self Care | Payer: Self-pay | Source: Other Acute Inpatient Hospital | Attending: Internal Medicine

## 2015-06-05 ENCOUNTER — Encounter (HOSPITAL_COMMUNITY): Payer: Self-pay | Admitting: Gastroenterology

## 2015-06-05 DIAGNOSIS — R579 Shock, unspecified: Secondary | ICD-10-CM

## 2015-06-05 DIAGNOSIS — I8501 Esophageal varices with bleeding: Secondary | ICD-10-CM

## 2015-06-05 DIAGNOSIS — I959 Hypotension, unspecified: Secondary | ICD-10-CM | POA: Diagnosis present

## 2015-06-05 DIAGNOSIS — K922 Gastrointestinal hemorrhage, unspecified: Secondary | ICD-10-CM

## 2015-06-05 HISTORY — PX: ESOPHAGOGASTRODUODENOSCOPY: SHX5428

## 2015-06-05 LAB — BASIC METABOLIC PANEL
Anion gap: 9 (ref 5–15)
BUN: 83 mg/dL — ABNORMAL HIGH (ref 6–20)
CHLORIDE: 99 mmol/L — AB (ref 101–111)
CO2: 24 mmol/L (ref 22–32)
CREATININE: 3.54 mg/dL — AB (ref 0.44–1.00)
Calcium: 7.8 mg/dL — ABNORMAL LOW (ref 8.9–10.3)
GFR calc non Af Amer: 15 mL/min — ABNORMAL LOW (ref >60)
GFR, EST AFRICAN AMERICAN: 17 mL/min — AB (ref >60)
Glucose, Bld: 84 mg/dL (ref 65–99)
Potassium: 4.1 mmol/L (ref 3.5–5.1)
Sodium: 132 mmol/L — ABNORMAL LOW (ref 135–145)

## 2015-06-05 LAB — COMPREHENSIVE METABOLIC PANEL
ALBUMIN: 2.2 g/dL — AB (ref 3.5–5.0)
ALK PHOS: 56 U/L (ref 38–126)
ALT: 31 U/L (ref 14–54)
ANION GAP: 10 (ref 5–15)
AST: 89 U/L — ABNORMAL HIGH (ref 15–41)
BILIRUBIN TOTAL: 1.7 mg/dL — AB (ref 0.3–1.2)
BUN: 85 mg/dL — ABNORMAL HIGH (ref 6–20)
CALCIUM: 8 mg/dL — AB (ref 8.9–10.3)
CO2: 26 mmol/L (ref 22–32)
Chloride: 96 mmol/L — ABNORMAL LOW (ref 101–111)
Creatinine, Ser: 3.57 mg/dL — ABNORMAL HIGH (ref 0.44–1.00)
GFR, EST AFRICAN AMERICAN: 17 mL/min — AB (ref >60)
GFR, EST NON AFRICAN AMERICAN: 15 mL/min — AB (ref >60)
GLUCOSE: 88 mg/dL (ref 65–99)
Potassium: 4 mmol/L (ref 3.5–5.1)
Sodium: 132 mmol/L — ABNORMAL LOW (ref 135–145)
TOTAL PROTEIN: 4.8 g/dL — AB (ref 6.5–8.1)

## 2015-06-05 LAB — MRSA PCR SCREENING: MRSA BY PCR: NEGATIVE

## 2015-06-05 LAB — CBC WITH DIFFERENTIAL/PLATELET
BASOS ABS: 0 10*3/uL (ref 0.0–0.1)
BASOS ABS: 0 10*3/uL (ref 0.0–0.1)
Basophils Relative: 1 %
Basophils Relative: 0 %
Eosinophils Absolute: 0.3 10*3/uL (ref 0.0–0.7)
Eosinophils Absolute: 0 10*3/uL (ref 0.0–0.7)
Eosinophils Relative: 4 %
Eosinophils Relative: 0 %
HEMATOCRIT: 23.5 % — AB (ref 36.0–46.0)
HEMATOCRIT: 25.4 % — AB (ref 36.0–46.0)
Hemoglobin: 7.9 g/dL — ABNORMAL LOW (ref 12.0–15.0)
Hemoglobin: 8.3 g/dL — ABNORMAL LOW (ref 12.0–15.0)
LYMPHS PCT: 24 %
LYMPHS PCT: 8 %
Lymphs Abs: 1.8 10*3/uL (ref 0.7–4.0)
Lymphs Abs: 0.6 10*3/uL — ABNORMAL LOW (ref 0.7–4.0)
MCH: 31.9 pg (ref 26.0–34.0)
MCH: 31.1 pg (ref 26.0–34.0)
MCHC: 33.6 g/dL (ref 30.0–36.0)
MCHC: 32.7 g/dL (ref 30.0–36.0)
MCV: 94.8 fL (ref 78.0–100.0)
MCV: 95.1 fL (ref 78.0–100.0)
MONO ABS: 0.1 10*3/uL (ref 0.1–1.0)
MONOS PCT: 2 %
Monocytes Absolute: 0.6 10*3/uL (ref 0.1–1.0)
Monocytes Relative: 8 %
NEUTROS ABS: 4.8 10*3/uL (ref 1.7–7.7)
NEUTROS ABS: 6.6 10*3/uL (ref 1.7–7.7)
NEUTROS PCT: 64 %
Neutrophils Relative %: 90 %
Platelets: 113 10*3/uL — ABNORMAL LOW (ref 150–400)
Platelets: 115 10*3/uL — ABNORMAL LOW (ref 150–400)
RBC: 2.48 MIL/uL — AB (ref 3.87–5.11)
RBC: 2.67 MIL/uL — ABNORMAL LOW (ref 3.87–5.11)
RDW: 18.9 % — ABNORMAL HIGH (ref 11.5–15.5)
RDW: 18.8 % — AB (ref 11.5–15.5)
WBC: 7.5 10*3/uL (ref 4.0–10.5)
WBC: 7.3 10*3/uL (ref 4.0–10.5)

## 2015-06-05 LAB — CORTISOL: Cortisol, Plasma: 7.5 ug/dL

## 2015-06-05 LAB — LACTIC ACID, PLASMA
LACTIC ACID, VENOUS: 0.9 mmol/L (ref 0.5–2.0)
Lactic Acid, Venous: 1.1 mmol/L (ref 0.5–2.0)

## 2015-06-05 LAB — CBC
HCT: 22.4 % — ABNORMAL LOW (ref 36.0–46.0)
HEMOGLOBIN: 7.5 g/dL — AB (ref 12.0–15.0)
MCH: 31.8 pg (ref 26.0–34.0)
MCHC: 33.5 g/dL (ref 30.0–36.0)
MCV: 94.9 fL (ref 78.0–100.0)
Platelets: 103 10*3/uL — ABNORMAL LOW (ref 150–400)
RBC: 2.36 MIL/uL — AB (ref 3.87–5.11)
RDW: 18.4 % — ABNORMAL HIGH (ref 11.5–15.5)
WBC: 7.5 10*3/uL (ref 4.0–10.5)

## 2015-06-05 LAB — PHOSPHORUS
PHOSPHORUS: 6.4 mg/dL — AB (ref 2.5–4.6)
Phosphorus: 6.1 mg/dL — ABNORMAL HIGH (ref 2.5–4.6)

## 2015-06-05 LAB — GLUCOSE, CAPILLARY
GLUCOSE-CAPILLARY: 84 mg/dL (ref 65–99)
Glucose-Capillary: 91 mg/dL (ref 65–99)

## 2015-06-05 LAB — MAGNESIUM
Magnesium: 2.6 mg/dL — ABNORMAL HIGH (ref 1.7–2.4)
Magnesium: 2.3 mg/dL (ref 1.7–2.4)

## 2015-06-05 LAB — TROPONIN I
Troponin I: <0.03 ng/mL (ref <0.031)
Troponin I: <0.03 ng/mL (ref <0.031)

## 2015-06-05 LAB — ABO/RH: ABO/RH(D): A POS

## 2015-06-05 LAB — AMMONIA: AMMONIA: 61 umol/L — AB (ref 9–35)

## 2015-06-05 SURGERY — EGD (ESOPHAGOGASTRODUODENOSCOPY)
Anesthesia: Moderate Sedation

## 2015-06-05 MED ORDER — FENTANYL CITRATE (PF) 100 MCG/2ML IJ SOLN
INTRAMUSCULAR | Status: DC | PRN
  Administered 2015-06-05 (×4): 25 ug via INTRAVENOUS

## 2015-06-05 MED ORDER — MIDODRINE HCL 5 MG PO TABS
10.0000 mg | ORAL_TABLET | ORAL | Status: DC
  Administered 2015-06-06: 10 mg via ORAL
  Filled 2015-06-05 (×4): qty 2

## 2015-06-05 MED ORDER — SODIUM CHLORIDE 0.9 % IV BOLUS (SEPSIS)
500.0000 mL | Freq: Once | INTRAVENOUS | Status: AC
Start: 2015-06-05 — End: 2015-06-05
  Administered 2015-06-05: 500 mL via INTRAVENOUS

## 2015-06-05 MED ORDER — PROMETHAZINE HCL 25 MG/ML IJ SOLN
INTRAMUSCULAR | Status: DC | PRN
  Administered 2015-06-05: 12.5 mg via INTRAVENOUS

## 2015-06-05 MED ORDER — LORAZEPAM 0.5 MG PO TABS
0.5000 mg | ORAL_TABLET | Freq: Two times a day (BID) | ORAL | Status: DC | PRN
  Administered 2015-06-05: 0.5 mg via ORAL
  Administered 2015-06-06 – 2015-06-07 (×3): 1 mg via ORAL
  Filled 2015-06-05 (×2): qty 2
  Filled 2015-06-05: qty 1
  Filled 2015-06-05: qty 2

## 2015-06-05 MED ORDER — MIDAZOLAM HCL 5 MG/ML IJ SOLN
INTRAMUSCULAR | Status: AC
  Filled 2015-06-05: qty 2

## 2015-06-05 MED ORDER — WHITE PETROLATUM GEL
Status: AC
  Administered 2015-06-06
  Filled 2015-06-05: qty 1

## 2015-06-05 MED ORDER — DARBEPOETIN ALFA 60 MCG/0.3ML IJ SOSY
60.0000 ug | PREFILLED_SYRINGE | INTRAMUSCULAR | Status: DC
  Administered 2015-06-06: 60 ug via INTRAVENOUS
  Filled 2015-06-05: qty 0.3

## 2015-06-05 MED ORDER — MIDAZOLAM HCL 10 MG/2ML IJ SOLN
INTRAMUSCULAR | Status: DC | PRN
  Administered 2015-06-05 (×4): 2 mg via INTRAVENOUS
  Administered 2015-06-05: 1 mg via INTRAVENOUS

## 2015-06-05 MED ORDER — FENTANYL CITRATE (PF) 100 MCG/2ML IJ SOLN
INTRAMUSCULAR | Status: AC
  Filled 2015-06-05: qty 2

## 2015-06-05 MED ORDER — BUTAMBEN-TETRACAINE-BENZOCAINE 2-2-14 % EX AERO
INHALATION_SPRAY | CUTANEOUS | Status: DC | PRN
  Administered 2015-06-05: 2 via TOPICAL

## 2015-06-05 MED ORDER — SUCRALFATE 1 GM/10ML PO SUSP
1.0000 g | Freq: Three times a day (TID) | ORAL | Status: DC
  Administered 2015-06-05 – 2015-06-09 (×13): 1 g via ORAL
  Filled 2015-06-05 (×15): qty 10

## 2015-06-05 MED ORDER — DIPHENHYDRAMINE HCL 50 MG/ML IJ SOLN
INTRAMUSCULAR | Status: AC
  Filled 2015-06-05: qty 1

## 2015-06-05 MED ORDER — SODIUM CHLORIDE 0.9 % IV BOLUS (SEPSIS)
500.0000 mL | Freq: Once | INTRAVENOUS | Status: AC
  Administered 2015-06-05: 500 mL via INTRAVENOUS

## 2015-06-05 MED ORDER — OXYCODONE HCL 5 MG PO TABS
5.0000 mg | ORAL_TABLET | Freq: Four times a day (QID) | ORAL | Status: DC | PRN
  Administered 2015-06-05 – 2015-06-06 (×5): 5 mg via ORAL
  Filled 2015-06-05 (×5): qty 1

## 2015-06-05 MED ORDER — HYDROCORTISONE NA SUCCINATE PF 100 MG IJ SOLR
50.0000 mg | Freq: Four times a day (QID) | INTRAMUSCULAR | Status: AC
  Administered 2015-06-05 – 2015-06-07 (×11): 50 mg via INTRAVENOUS
  Filled 2015-06-05 (×9): qty 1
  Filled 2015-06-05: qty 2
  Filled 2015-06-05 (×2): qty 1

## 2015-06-05 MED ORDER — PROMETHAZINE HCL 25 MG/ML IJ SOLN
INTRAMUSCULAR | Status: AC
  Filled 2015-06-05: qty 1

## 2015-06-05 NOTE — Consult Note (Addendum)
Wanda [name] is an 39 y.o. female referred by Dr Nelda Marseille   Chief Complaint: ESRD, anemia HPI: 39yo WF with ESRD and hepatic cirrhosis transferred to [facility] from [facility] for GI bleed.  On HD at [facility] TTS and had HD yest. Though did not get full tx due to hypotension and syncope.  Sent from dialysis unit to ER and Hg was 5.  Takes 5mg  midodrine pre dialysis  Past Medical History  Diagnosis Date  . GERD (gastroesophageal reflux disease)   . Cirrhosis ([place]) 10/05/13    Liver bx 11/23/13 (delayed initially due to patient refusal). c/w steatohepatitis  . Folate deficiency 09/2013  . Anasarca 10/10/2013  . Hematemesis/vomiting blood 02/24/2014  . Acute blood loss anemia 02/25/2014    Status post transfusion  . Macrocytosis 02/28/2014  . Bleeding esophageal varices ([place]) 02/28/2014    s/p banding  . Acute renal failure ([place]) 09/2013    Pre-renal- resolved  . C. difficile colitis 04/19/2014  . Anxiety   . Depression   . Thrombocytopenia ([place])     Hypercellular bone marrow; abundant megakaryocytes per 08/27/2014; s/p bone marrow bx  . Cirrhosis of liver with ascites ([place])   . SBP (spontaneous bacterial peritonitis) ([place]) 11/10/2013  . Bipolar disorder ([place]) 12/04/2013    2007-SEEN IN ED FOR INVOLUNTARY COMMITMENT, UDS POS FOR AMPHETAMINES/OPIATES   . PNA (pneumonia) 10/13/2013  . Chronic hypotension   . Gastroesophageal junction ulcer 09/17/2014  . ESRD (end stage renal disease) on dialysis ([place]) 08/2014    Past Surgical History  Procedure Laterality Date  . None    . Paracentesis  Feb 2015    1180 fluid, negative fluid analysis.   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 Paracentesis  10/2013  . Colonoscopy N/A 12/19/2013    SLF:NO OBVIOUS SOURCE FOR ANEMIA IDETIFIED/ONE COLON POLYP REMOVED/Small internal hemorrhoids  . Esophagogastroduodenoscopy N/A  02/11/2014    Dr. Rourk:Esophageal varices with bleeding stigmata-status post esophageal band ligation therapy. Portal gastropathy  . Esophagogastroduodenoscopy N/A 07/04/2014    RMR: Persiting grade 2 esophageal varicies with bleeding stigmata status post band ligation. Significantly congested gastric mucosa iwith changes constistant with protal gastropathy.   . Esophageal banding  07/04/2014    Procedure: ESOPHAGEAL BANDING;  Surgeon: Daneil Dolin, MD;  Location: [facility] ENDO SUITE;  Service: Endoscopy;;  . Esophagogastroduodenoscopy (egd) with propofol N/A 07/24/2014    SLF:  1. 2 columns grade 2-3 varices- 2 Bands applied.  2.  Moderate gastropathy 3. Duodenal Diverticula  . Esophageal banding N/A 07/24/2014    Procedure: ESOPHAGEAL BANDING (2 bands applied);  Surgeon: Danie Binder, MD;  Location: [facility] ORS;  Service: Endoscopy;  Laterality: N/A;  . Central venous catheter insertion Right   . Esophagogastroduodenoscopy N/A 09/16/2014    Rehman: Single short column of varix proximal to GE junction not large enough to be banded. Two amall ulcers at the GEJ felt to be source of GI Bleeding but no active bleeding but no actibe bleeding noted. No therapy rendered. Portal gastropathy NO evidence of peptic ulcer diease or gastric varices.   . Esophagogastroduodenoscopy (egd) with propofol N/A 10/26/2014    RMR: 2 columns of grade 2 esophageal varices without obvious bleeding stigmata status post band ligation to complet obliteration of remaining varices.   . Av fistula placement Right 11/16/2014    Procedure: Right arm Creation of arteriovenous fistula;  Surgeon: Harrell Gave  Nicole Cella, MD;  Location: [facility] OR;  Service: Vascular;  Laterality: Right;  . Esophagogastroduodenoscopy N/A 12/14/2014    SLF: Grade ! esophageal varices. 2. Moderate Portal Gastropathy  . Esophagogastroduodenoscopy N/A 03/27/2015    Procedure: ESOPHAGOGASTRODUODENOSCOPY (EGD);  Surgeon: Danie Binder, MD;  Location: [facility] END[facility] SUITE;  Service:  Endoscopy;  Laterality: N/A;  1045am - moved to 817 @ 11:30    Family History  Problem Relation Age of Onset  . Heart disease Mother   . Colon cancer Neg Hx   . Liver disease Neg Hx   FH neg for renal ds  Social History:  reports that she quit smoking about 6 years ago. Her smoking use included Cigarettes. She has a 5 pack-year smoking history. She has never used smokeless tobacco. She reports that she does not drink alcohol or use illicit drugs. Not married but in committed relationship x 79yr Allergies:  Allergies  Allergen Reactions  . Lasix [Furosemide]     "doesn't work"  . Latex Itching  . Morphine And Related Hives and Itching    Medications Prior to Admission  Medication Sig Dispense Refill  . diazepam (VALIUM) 5 MG tablet Take 1 tablet (5 mg total) by mouth every 6 (six) hours as needed for muscle spasms (spasms). 10 tablet 0  . lactulose (CHRONULAC) 10 GM/15ML solution Take 15 mLs (10 g total) by mouth 2 (two) times daily. (Patient taking differently: Take 10 g by mouth daily as needed for mild constipation or moderate constipation. ) 946 mL 3  . lidocaine (XYLOCAINE) 2 % solution TAKE TWO TEASPOONSFUL (10ML) BY MOUTH BEFORE MEALS AND AT BEDTIME TO PREVENT CHEST PAIN WHILE EATING 500 mL 0  . lidocaine-prilocaine (EMLA) cream Apply 1 application topically See admin instructions. Applied prior to port access  6  . LORazepam (ATIVAN) 1 MG tablet HALF TO 1 TAB PO TWICE DAILY FOR BACK SPASM OR ANXIETY (Patient taking differently: Take 0.5-1 mg by mouth 2 (two) times daily as needed (FOR BACK SPASM OR ANXIETY). FOR BACK SPASM OR ANXIETY) 45 tablet 1  . metolazone (ZAROXOLYN) 5 MG tablet Take 2 tablets (10 mg total) by mouth daily. 60 tablet 3  . midodrine (PROAMATINE) 10 MG tablet Take 1 tablet (10 mg total) by mouth 2 (two) times daily with a meal. Medication to keep your blood pressure from falling. (Patient taking differently: Take 10 mg by mouth daily. *Takes only on dialysis  days on Tuesdays, Thursdays, and Saturdays (Sometimes takes treatments on Friday instead of Saturday)**Medication to keep your blood pressure from falling.) 60 tablet 3  . Oxycodone HCl 10 MG TABS 1 PO TID AS NEEDED FOR PAIN (Patient taking differently: Take 10 mg by mouth 3 (three) times daily. ) 90 tablet 0  . pantoprazole (PROTONIX) 40 MG tablet Take 1 tablet (40 mg total) by mouth 2 (two) times daily. (Patient taking differently: Take 40 mg by mouth daily as needed (for acid reflux/GERD). ) 60 tablet 2  . promethazine (PHENERGAN) 12.5 MG tablet TAKE ONE TABLET BY MOUTH EVERY 6 HOURS AS NEEDED FOR NAUSEA AND VOMITING. 30 tablet 1     Lab Results: UA: ND   Recent Labs  06/04/15 2306 06/05/15 0420 06/05/15 0812  WBC 8.2 7.5 7.5  HGB 6.8* 7.5* 7.9*  HCT 20.7* 22.4* 23.5*  PLT 117* 103* 113*   BMET  Recent Labs  06/04/15 2306 06/05/15 0421  NA 132* 132*  K 4.0 4.1  CL 96* 99*  CO2 26 24  GLUCOSE 88 84  BUN 85* 83*  CREATININE 3.57* 3.54*  CALCIUM 8.0* 7.8*  PHOS 6.1* 6.4*   LFT  Recent Labs  06/04/15 2306  PROT 4.8*  ALBUMIN 2.2*  AST 89*  ALT 31  ALKPHOS 56  BILITOT 1.7*   No results found.  ROS: No change in vision No SOB CO vague CP + abd fullness CO pain in jts "all over"  PHYSICAL EXAM: Blood pressure 101/35, pulse 74, temperature 97.4 F (36.3 C), temperature source Oral, resp. rate 16, height 5\' 2"  (1.575 m), weight 59.8 kg (131 lb 13.4 oz), last menstrual period 09/19/2013, SpO2 96 %. HEENT: PERRLA EOMI  Conjuctiva pale NECK:No JVD LUNGS:Few basilar crackles CARDIAC:RRR 2/6 systolic M LSB ABD:+ BS NT + ascites, + ventral hernia EXT:tr edema  RUA AVF + bruit NEURO:CNI Ox3 no asterixis    HD TTS, 3hr 15 min, BFR 350, 15g, 3K 2.5Ca, DW 52KG  Assessment: 1. GI bleed 2. ESRD 3. Anemia sec ESRD and GI bleed 4. Cirrhosis sec NASH 5. Sec HPTH not requiring vit D  PLAN: 1. HD tomorrow wo heparin 2. Increase midodrine to 10mg  pre  HD 3. Resume EPO   Onix Jumper T 06/05/2015, 12:58 PM

## 2015-06-05 NOTE — Telephone Encounter (Signed)
I REVIEWED EGD FROM OCT 26. PLEASE CALL PT'S BOYFRIEND. SHE WILL NEED OPV 4 WEEKS AFTER D/C FROM [facility].

## 2015-06-05 NOTE — Consult Note (Signed)
Reason for Consult: Upper GI bleeding Referring Physician: Hospital team  Wanda [name] is an 39 y.o. female.  HPI: Patient followed at [facility] by the GI group there but admitted here for GI bleeding and dialysis and I was asked to see the patient and she's also been to [facility] for transplant evaluation which she is in the process of but she is not sure if they plan to do both a liver and kidney or just liver and right now she has no signs of bleeding and denies any aspirin or nonsteroidals and her hospital computer chart was reviewed as well as her previous endoscopies and she has no other complaints  Past Medical History  Diagnosis Date  . GERD (gastroesophageal reflux disease)   . Cirrhosis ([place]) 10/05/13    Liver bx 11/23/13 (delayed initially due to patient refusal). c/w steatohepatitis  . Folate deficiency 09/2013  . Anasarca 10/10/2013  . Hematemesis/vomiting blood 02/24/2014  . Acute blood loss anemia 02/25/2014    Status post transfusion  . Macrocytosis 02/28/2014  . Bleeding esophageal varices ([place]) 02/28/2014    s/p banding  . Acute renal failure ([place]) 09/2013    Pre-renal- resolved  . C. difficile colitis 04/19/2014  . Anxiety   . Depression   . Thrombocytopenia ([place])     Hypercellular bone marrow; abundant megakaryocytes per 08/27/2014; s/p bone marrow bx  . Cirrhosis of liver with ascites ([place])   . SBP (spontaneous bacterial peritonitis) ([place]) 11/10/2013  . Bipolar disorder ([place]) 12/04/2013    2007-SEEN IN ED FOR INVOLUNTARY COMMITMENT, UDS POS FOR AMPHETAMINES/OPIATES   . PNA (pneumonia) 10/13/2013  . Chronic hypotension   . Gastroesophageal junction ulcer 09/17/2014  . ESRD (end stage renal disease) on dialysis ([place]) 08/2014    Past Surgical History  Procedure Laterality Date  . None    . Paracentesis  Feb 2015    1180 fluid, negative fluid analysis.   [name] Esophagogastroduodenoscopy N/A 11/14/2013    SLF:1 column of very small varices in distal esopahgus/MODERATE PORTAL  GASTROPATHY IN PROXIMAL STOMACH/MODERATE erosive gastritis  . Paracentesis  10/2013  . Colonoscopy N/A 12/19/2013    SLF:NO OBVIOUS SOURCE FOR ANEMIA IDETIFIED/ONE COLON POLYP REMOVED/Small internal hemorrhoids  . Esophagogastroduodenoscopy N/A 02/11/2014    Dr. Rourk:Esophageal varices with bleeding stigmata-status post esophageal band ligation therapy. Portal gastropathy  . Esophagogastroduodenoscopy N/A 07/04/2014    RMR: Persiting grade 2 esophageal varicies with bleeding stigmata status post band ligation. Significantly congested gastric mucosa iwith changes constistant with protal gastropathy.   . Esophageal banding  07/04/2014    Procedure: ESOPHAGEAL BANDING;  Surgeon: Daneil Dolin, MD;  Location: [facility] ENDO SUITE;  Service: Endoscopy;;  . Esophagogastroduodenoscopy (egd) with propofol N/A 07/24/2014    SLF:  1. 2 columns grade 2-3 varices- 2 Bands applied.  2.  Moderate gastropathy 3. Duodenal Diverticula  . Esophageal banding N/A 07/24/2014    Procedure: ESOPHAGEAL BANDING (2 bands applied);  Surgeon: Danie Binder, MD;  Location: [facility] ORS;  Service: Endoscopy;  Laterality: N/A;  . Central venous catheter insertion Right   . Esophagogastroduodenoscopy N/A 09/16/2014    Rehman: Single short column of varix proximal to GE junction not large enough to be banded. Two amall ulcers at the GEJ felt to be source of GI Bleeding but no active bleeding but no actibe bleeding noted. No therapy rendered. Portal gastropathy NO evidence of peptic ulcer diease or gastric varices.   . Esophagogastroduodenoscopy (egd) with propofol N/A 10/26/2014  RMR: 2 columns of grade 2 esophageal varices without obvious bleeding stigmata status post band ligation to complet obliteration of remaining varices.   . Av fistula placement Right 11/16/2014    Procedure: Right arm Creation of arteriovenous fistula;  Surgeon: Angelia Mould, MD;  Location: [facility] OR;  Service: Vascular;  Laterality: Right;  .  Esophagogastroduodenoscopy N/A 12/14/2014    SLF: Grade ! esophageal varices. 2. Moderate Portal Gastropathy  . Esophagogastroduodenoscopy N/A 03/27/2015    Procedure: ESOPHAGOGASTRODUODENOSCOPY (EGD);  Surgeon: Danie Binder, MD;  Location: [facility];  Service: Endoscopy;  Laterality: N/A;  1045am - moved to 817 @ 11:30    Family History  Problem Relation Age of Onset  . Heart disease Mother   . Colon cancer Neg Hx   . Liver disease Neg Hx     Social History:  reports that she quit smoking about 6 years ago. Her smoking use included Cigarettes. She has a 5 pack-year smoking history. She has never used smokeless tobacco. She reports that she does not drink alcohol or use illicit drugs.  Allergies:  Allergies  Allergen Reactions  . Lasix [Furosemide]     "doesn't work"  . Latex Itching  . Morphine And Related Hives and Itching    Medications: I have reviewed the patient's current medications.  Results for orders placed or performed during the hospital encounter of 06/04/15 (from the past 48 hour(s))  Glucose, capillary     Status: None   Collection Time: 06/04/15  9:48 PM  Result Value Ref Range   Glucose-Capillary 86 65 - 99 mg/dL  I-STAT 3, arterial blood gas (G3+)     Status: Abnormal   Collection Time: 06/04/15 10:25 PM  Result Value Ref Range   pH, Arterial 7.693 (HH) 7.350 - 7.450   pCO2 arterial 24.9 (L) 35.0 - 45.0 mmHg   pO2, Arterial 155.0 (H) 80.0 - 100.0 mmHg   Bicarbonate 30.2 (H) 20.0 - 24.0 mEq/L   TCO2 31 0 - 100 mmol/L   O2 Saturation 100.0 %   Acid-Base Excess 10.0 (H) 0.0 - 2.0 mmol/L   Patient temperature HIDE    Collection site RADIAL, ALLEN'S TEST ACCEPTABLE    Drawn by RT    Sample type ARTERIAL    Comment NOTIFIED PHYSICIAN   Magnesium     Status: Abnormal   Collection Time: 06/04/15 11:06 PM  Result Value Ref Range   Magnesium 2.6 (H) 1.7 - 2.4 mg/dL  Phosphorus     Status: Abnormal   Collection Time: 06/04/15 11:06 PM  Result Value Ref  Range   Phosphorus 6.1 (H) 2.5 - 4.6 mg/dL  Comprehensive metabolic panel     Status: Abnormal   Collection Time: 06/04/15 11:06 PM  Result Value Ref Range   Sodium 132 (L) 135 - 145 mmol/L   Potassium 4.0 3.5 - 5.1 mmol/L   Chloride 96 (L) 101 - 111 mmol/L   CO2 26 22 - 32 mmol/L   Glucose, Bld 88 65 - 99 mg/dL   BUN 85 (H) 6 - 20 mg/dL   Creatinine, Ser 3.57 (H) 0.44 - 1.00 mg/dL   Calcium 8.0 (L) 8.9 - 10.3 mg/dL   Total Protein 4.8 (L) 6.5 - 8.1 g/dL   Albumin 2.2 (L) 3.5 - 5.0 g/dL   AST 89 (H) 15 - 41 U/L   ALT 31 14 - 54 U/L   Alkaline Phosphatase 56 38 - 126 U/L   Total Bilirubin 1.7 (H) 0.3 - 1.2  mg/dL   GFR calc non Af Amer 15 (L) >60 mL/min   GFR calc Af Amer 17 (L) >60 mL/min    Comment: (NOTE) The eGFR has been calculated using the CKD EPI equation. This calculation has not been validated in all clinical situations. eGFR's persistently <60 mL/min signify possible Chronic Kidney Disease.    Anion gap 10 5 - 15  Troponin I     Status: None   Collection Time: 06/04/15 11:06 PM  Result Value Ref Range   Troponin I <0.03 <0.031 ng/mL    Comment:        NO INDICATION OF MYOCARDIAL INJURY.   Lactic acid, plasma     Status: None   Collection Time: 06/04/15 11:06 PM  Result Value Ref Range   Lactic Acid, Venous 1.1 0.5 - 2.0 mmol/L  Cortisol     Status: None   Collection Time: 06/04/15 11:06 PM  Result Value Ref Range   Cortisol, Plasma 7.5 ug/dL    Comment: (NOTE) AM    6.7 - 22.6 ug/dL PM   <10.0       ug/dL   CBC     Status: Abnormal   Collection Time: 06/04/15 11:06 PM  Result Value Ref Range   WBC 8.2 4.0 - 10.5 K/uL   RBC 2.16 (L) 3.87 - 5.11 MIL/uL   Hemoglobin 6.8 (LL) 12.0 - 15.0 g/dL    Comment: REPEATED TO VERIFY CRITICAL RESULT CALLED TO, READ BACK BY AND VERIFIED WITH: DD TURNER,RN [id number] WILDERK    HCT 20.7 (L) 36.0 - 46.0 %   MCV 95.8 78.0 - 100.0 fL   MCH 31.5 26.0 - 34.0 pg   MCHC 32.9 30.0 - 36.0 g/dL   RDW 18.7 (H) 11.5 - 15.5  %   Platelets 117 (L) 150 - 400 K/uL  Protime-INR     Status: Abnormal   Collection Time: 06/04/15 11:06 PM  Result Value Ref Range   Prothrombin Time 16.2 (H) 11.6 - 15.2 seconds   INR 1.29 0.00 - 1.49  Type and screen If need to transfuse blood products please use the blood administration order set     Status: None (Preliminary result)   Collection Time: 06/04/15 11:06 PM  Result Value Ref Range   ABO/RH(D) A POS    Antibody Screen NEG    Sample Expiration 06/07/2015    Unit Number [id number]    Blood Component Type RED CELLS,LR    Unit division 00    Status of Unit ISSUED    Transfusion Status OK TO TRANSFUSE    Crossmatch Result Compatible   Ammonia     Status: Abnormal   Collection Time: 06/04/15 11:06 PM  Result Value Ref Range   Ammonia 61 (H) 9 - 35 umol/L  ABO/Rh     Status: None   Collection Time: 06/04/15 11:06 PM  Result Value Ref Range   ABO/RH(D) A POS   Prepare RBC     Status: None   Collection Time: 06/04/15 11:43 PM  Result Value Ref Range   Order Confirmation ORDER PROCESSED BY BLOOD BANK   Glucose, capillary     Status: None   Collection Time: 06/05/15  4:07 AM  Result Value Ref Range   Glucose-Capillary 84 65 - 99 mg/dL  MRSA PCR Screening     Status: None   Collection Time: 06/05/15  4:15 AM  Result Value Ref Range   MRSA by PCR NEGATIVE NEGATIVE    Comment:  The GeneXpert MRSA Assay (FDA approved for NASAL specimens only), is one component of a comprehensive MRSA colonization surveillance program. It is not intended to diagnose MRSA infection nor to guide or monitor treatment for MRSA infections.   CBC     Status: Abnormal   Collection Time: 06/05/15  4:20 AM  Result Value Ref Range   WBC 7.5 4.0 - 10.5 K/uL   RBC 2.36 (L) 3.87 - 5.11 MIL/uL   Hemoglobin 7.5 (L) 12.0 - 15.0 g/dL   HCT 22.4 (L) 36.0 - 46.0 %   MCV 94.9 78.0 - 100.0 fL   MCH 31.8 26.0 - 34.0 pg   MCHC 33.5 30.0 - 36.0 g/dL   RDW 18.4 (H) 11.5 - 15.5 %    Platelets 103 (L) 150 - 400 K/uL    Comment: REPEATED TO VERIFY CONSISTENT WITH PREVIOUS RESULT   Troponin I     Status: None   Collection Time: 06/05/15  4:21 AM  Result Value Ref Range   Troponin I <0.03 <0.031 ng/mL    Comment:        NO INDICATION OF MYOCARDIAL INJURY.   Basic metabolic panel     Status: Abnormal   Collection Time: 06/05/15  4:21 AM  Result Value Ref Range   Sodium 132 (L) 135 - 145 mmol/L   Potassium 4.1 3.5 - 5.1 mmol/L   Chloride 99 (L) 101 - 111 mmol/L   CO2 24 22 - 32 mmol/L   Glucose, Bld 84 65 - 99 mg/dL   BUN 83 (H) 6 - 20 mg/dL   Creatinine, Ser 3.54 (H) 0.44 - 1.00 mg/dL   Calcium 7.8 (L) 8.9 - 10.3 mg/dL   GFR calc non Af Amer 15 (L) >60 mL/min   GFR calc Af Amer 17 (L) >60 mL/min    Comment: (NOTE) The eGFR has been calculated using the CKD EPI equation. This calculation has not been validated in all clinical situations. eGFR's persistently <60 mL/min signify possible Chronic Kidney Disease.    Anion gap 9 5 - 15  Magnesium     Status: None   Collection Time: 06/05/15  4:21 AM  Result Value Ref Range   Magnesium 2.3 1.7 - 2.4 mg/dL  Phosphorus     Status: Abnormal   Collection Time: 06/05/15  4:21 AM  Result Value Ref Range   Phosphorus 6.4 (H) 2.5 - 4.6 mg/dL  Lactic acid, plasma     Status: None   Collection Time: 06/05/15  5:32 AM  Result Value Ref Range   Lactic Acid, Venous 0.9 0.5 - 2.0 mmol/L  CBC with Differential/Platelet     Status: Abnormal   Collection Time: 06/05/15  8:12 AM  Result Value Ref Range   WBC 7.5 4.0 - 10.5 K/uL    Comment: REPEATED TO VERIFY   RBC 2.48 (L) 3.87 - 5.11 MIL/uL   Hemoglobin 7.9 (L) 12.0 - 15.0 g/dL    Comment: REPEATED TO VERIFY CONSISTENT WITH PREVIOUS RESULT    HCT 23.5 (L) 36.0 - 46.0 %   MCV 94.8 78.0 - 100.0 fL   MCH 31.9 26.0 - 34.0 pg   MCHC 33.6 30.0 - 36.0 g/dL   RDW 18.9 (H) 11.5 - 15.5 %   Platelets 113 (L) 150 - 400 K/uL    Comment: REPEATED TO VERIFY CONSISTENT WITH  PREVIOUS RESULT    Neutrophils Relative % 64 %   Neutro Abs 4.8 1.7 - 7.7 K/uL   Lymphocytes Relative 24 %  Lymphs Abs 1.8 0.7 - 4.0 K/uL   Monocytes Relative 8 %   Monocytes Absolute 0.6 0.1 - 1.0 K/uL   Eosinophils Relative 4 %   Eosinophils Absolute 0.3 0.0 - 0.7 K/uL   Basophils Relative 1 %   Basophils Absolute 0.0 0.0 - 0.1 K/uL  Troponin I     Status: None   Collection Time: 06/05/15  9:40 AM  Result Value Ref Range   Troponin I <0.03 <0.031 ng/mL    Comment:        NO INDICATION OF MYOCARDIAL INJURY.     No results found.  ROS negative except above Blood pressure 101/35, pulse 77, temperature 97.4 F (36.3 C), temperature source Oral, resp. rate 14, height 5' 2" (1.575 m), weight 59.8 kg (131 lb 13.4 oz), last menstrual period 09/19/2013, SpO2 98 %. Physical Exam vital signs stable afebrile no acute distress lungs are clear regular rate and rhythm abdomen is soft nontender minimal pedal edema labs reviewed  Assessment/Plan: Cirrhosis and renal failure and upper GI bleeding Plan: We discussed endoscopy and will proceed this afternoon just to be sure with further workup and plans pending those findings  Shalondra Wunschel E 06/05/2015, 2:44 PM

## 2015-06-05 NOTE — Progress Notes (Signed)
PULMONARY / CRITICAL CARE MEDICINE   Name: Wanda [name] MRN: [id number] DOB: [birth year]    ADMISSION DATE:  06/04/2015 CONSULTATION DATE:  06/04/2015  REFERRING MD :  [place]:  Syncope  INITIAL PRESENTATION: 39 year old female with history of cirrhosis and ESRD on HD suffered a syncopal episode while dialysis with systolic blood pressure noted to be 60s. In Emergency department she unresponsive and was found to be anemic with hemoglobin 5.1 requiring 2 units PRBC transfusion. She was transported to [facility] for further evaluation.   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 She also has history of anxiety depression and bipolar disorder. PMH also includes chronic hypotension with systolic blood pressure around 100.She was being considered for liver transplant at [facility], however, she had been not compliant with her appointments including imaging social worker appointments and psychology appointments, also positive UDS for cannabis, is unclear whether or not this is still an option for her. 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   06/04/2015 she was in her usual state of health on the time present for dialysis. Very shortly after her dialysis treatment she developed syncope associated with hypotension. She was transported to [facility] emergency department and found to be hypotensive with systolic blood pressures in the 60s. She was also found to be anemic with  hemoglobin 5.1 requiring 2 units PRBC transfusion. She was essentially unresponsive time of presentation however after blood transfusion her mental status has improved. Neurology Lovie Macadamia was contacted and felt as if the patient would benefit from higher level medical care of [place] boyfriend states that she is had multiple episodes of GI bleeding requiring blood transfusion already this year. He also notes that she experienced black stools last night. Currently she has no complaints of chest pain or shortness of breath, or dizziness. She does complain of some diffuse abdominal pain which is better chronic problem over the past few months, however, I suspect she is a poor historian given her current illness.  SUBJECTIVE:   VITAL SIGNS: Temp:  [97.9 F (36.6 C)-98.7 F (37.1 C)] 98.7 F (37.1 C) (10/26 0844) Pulse Rate:  [75-99] 77 (10/26 1045) Resp:  [11-21] 21 (10/26 1045) BP: (63-108)/(27-45) 101/35 mmHg (10/26 0115) SpO2:  [93 %-100 %] 97 % (10/26 1045) Arterial Line BP: (76-119)/(28-48) 99/31 mmHg (10/26 1045) Weight:  [58.8 kg (129 lb 10.1 oz)-59.8 kg (131 lb 13.4 oz)] 59.8 kg (131 lb 13.4 oz) (10/26 0500) HEMODYNAMICS:   VENTILATOR SETTINGS:   INTAKE / OUTPUT:  Intake/Output Summary (Last 24 hours) at 06/05/15 1110 Last data filed at 06/05/15 0800  Gross per 24 hour  Intake   1525 ml  Output    200 ml  Net   1325 ml   PHYSICAL EXAMINATION: General:  Chronically ill appearing female in NAD Neuro:  Alert, oriented x 3, some confusion and slow to respond HEENT:  [place]/AT, no JVD, PERRL Cardiovascular:  Tachy, regular, no MRG. BLE +2 edema.  Lungs:  Clear bilateral breath  sounds, unlabored Abdomen:  Soft, diffusely tender, ascites with umbilical protrusion.  Musculoskeletal:  No acute deformity or ROM limitation. Skin:  Pallor, mild jaundice  LABS:  CBC  Recent Labs Lab 06/04/15 2306 06/05/15 0420 06/05/15 0812  WBC 8.2 7.5 7.5  HGB 6.8* 7.5* 7.9*   HCT 20.7* 22.4* 23.5*  PLT 117* 103* 113*   Coag's  Recent Labs Lab 06/04/15 2306  INR 1.29   BMET  Recent Labs Lab 06/04/15 2306 06/05/15 0421  NA 132* 132*  K 4.0 4.1  CL 96* 99*  CO2 26 24  BUN 85* 83*  CREATININE 3.57* 3.54*  GLUCOSE 88 84   Electrolytes  Recent Labs Lab 06/04/15 2306 06/05/15 0421  CALCIUM 8.0* 7.8*  MG 2.6* 2.3  PHOS 6.1* 6.4*   Sepsis Markers  Recent Labs Lab 06/04/15 2306 06/05/15 0532  LATICACIDVEN 1.1 0.9   ABG  Recent Labs Lab 06/04/15 2225  PHART 7.693*  PCO2ART 24.9*  PO2ART 155.0*   Liver Enzymes  Recent Labs Lab 06/04/15 2306  AST 89*  ALT 31  ALKPHOS 56  BILITOT 1.7*  ALBUMIN 2.2*   Cardiac Enzymes  Recent Labs Lab 06/04/15 2306 06/05/15 0421 06/05/15 0940  TROPONINI <0.03 <0.03 <0.03   Glucose  Recent Labs Lab 06/04/15 2148 06/05/15 0407  GLUCAP 86 84   Imaging I reviewed CXR myself, no evidence of acute disease.  ASSESSMENT / PLAN:  PULMONARY A: No acute issues  P:   Supplemental O2 as needed PRN. Monitor for airway protection.  CARDIOVASCULAR A:  Hemorrhagic/hypovolemic shock > improved after PRBC  P:  Tele Volume/Blood product resuscitation as below. Trend troponin. D/C a-line.  RENAL A:   ESRD on HD TTS Pseudo hypocalcemia (corrects to 9.1) Metabolic alkalosis (asa neg at [place])  P:   Renal consult called. KVO IVF. Not overloaded on exam. STAT BMET.  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    FAMILY  - Updates:   - Inter-disciplinary family meet or Palliative Care meeting due by:  11/2  Discussed with TRH-MD.  Will transfer to SDU and to [facility] service with PCCM off 10/27.  Rush Farmer, M.D. [facility] Pulmonary/Critical Care Medicine. Pager: [phone]. After hours pager: [phone].  06/05/2015 11:10 AM

## 2015-06-05 NOTE — Telephone Encounter (Signed)
PT BOYFRIEND CALLED AND STATED THAT SHE IS ADMITTED AT [facility] AND THEY ARE IN THE PROCESS OF PERFORMING AN ENDO ON HER TO TRY AND FIND OUT WHERE SHE IS LOSING BLOOD FROM.

## 2015-06-05 NOTE — Telephone Encounter (Signed)
Noted  

## 2015-06-05 NOTE — Progress Notes (Signed)
eLink Physician-Brief Progress Note Patient Name: Wanda [name] DOB: 08/31/1975 MRN: [id number]   Date of Service  06/05/2015  HPI/Events of Note  Hypotension overnight despite fluid resuscitation and 1 U PRBC Baseline systolic BP 49'P Initial lactic acid 1.1 No signs of bleeding Cortisol noted to be 7.5 Mentation intact  eICU Interventions  Start Hydrocortisone 50mg  IV q6h Repeat lactic acid now Hold off on fluid resuscitation for now as she is third spacing and doesn't appear to be in shock and baseline hypotensive      Intervention Category Intermediate Interventions: Hypotension - evaluation and management  Simonne Maffucci 06/05/2015, 4:57 AM

## 2015-06-05 NOTE — Op Note (Signed)
[facility], [id number]   ENDOSCOPY PROCEDURE REPORT  PATIENT: Wanda [name], Meter  MR#: [id number] BIRTHDATE: January 25, 1976 , 39  yrs. old GENDER: female ENDOSCOPIST: Clarene Essex, MD REFERRED BY: PROCEDURE DATE:  06-14-2015 PROCEDURE:  EGD w/ band ligation of varices ASA CLASS:     Class IV INDICATIONS:  hematemesis. MEDICATIONS: Fentanyl 100 mcg IV, Versed 9 mg IV, and PHENERGAN 12.5 MG IV TOPICAL ANESTHETIC: Cetacaine Spray  DESCRIPTION OF PROCEDURE: After the risks benefits and alternatives of the procedure were thoroughly explained, informed consent was obtained.  The Pentax Gastroscope [id number] endoscope was introduced through the mouth and advanced to the second portion of the duodenum , Without limitations.  The instrument was slowly withdrawn as the mucosa was fully examined. Estimated blood loss is zero unless otherwise noted in this procedure report.    the findings are recorded below       Retroflexed views revealed portal gastropathy.     The scope was then withdrawn from the patient and the procedure completed.  COMPLICATIONS: There were no immediate complications.  ENDOSCOPIC IMPRESSION: 1. Tiny hiatal hernia 2. Minimal GE junction varices however 1 with small red spot which bled at end of procedure status post 4 bands placed with good cessation of bleeding 3. Mild proximal portal gastropathy 4. Otherwise within normal limits to the second portion of the duodenum  RECOMMENDATIONS: clear liquids only consider beta blockers and Carafate and weaning octreotide in a few days and may even consider TIPS in the future particularly if frequent bleeding continues and continue transplant evaluation at [facility] in the future as well  REPEAT EXAM: as needed  eSigned:  Clarene Essex, MD 2015/06/14 3:41 PM    CC:  CPT CODES: ICD CODES:  The ICD and CPT codes recommended by this software are interpretations from the  data that the clinical staff has captured with the software.  The verification of the translation of this report to the ICD and CPT codes and modifiers is the sole responsibility of the health care institution and practicing physician where this report was generated.  [place]. will not be held responsible for the validity of the ICD and CPT codes included on this report.  AMA assumes no liability for data contained or not contained herein. CPT is a Designer, television/film set of the [employer].  PATIENT NAME:  Katheren, Jimmerson MR#: [id number]

## 2015-06-06 ENCOUNTER — Encounter (HOSPITAL_COMMUNITY): Payer: Self-pay | Admitting: Gastroenterology

## 2015-06-06 DIAGNOSIS — K7290 Hepatic failure, unspecified without coma: Secondary | ICD-10-CM | POA: Diagnosis present

## 2015-06-06 DIAGNOSIS — Z992 Dependence on renal dialysis: Secondary | ICD-10-CM

## 2015-06-06 DIAGNOSIS — R578 Other shock: Secondary | ICD-10-CM | POA: Diagnosis present

## 2015-06-06 DIAGNOSIS — K922 Gastrointestinal hemorrhage, unspecified: Secondary | ICD-10-CM | POA: Diagnosis present

## 2015-06-06 DIAGNOSIS — K7460 Unspecified cirrhosis of liver: Secondary | ICD-10-CM | POA: Diagnosis present

## 2015-06-06 DIAGNOSIS — D62 Acute posthemorrhagic anemia: Secondary | ICD-10-CM | POA: Diagnosis present

## 2015-06-06 DIAGNOSIS — R571 Hypovolemic shock: Secondary | ICD-10-CM

## 2015-06-06 DIAGNOSIS — D696 Thrombocytopenia, unspecified: Secondary | ICD-10-CM

## 2015-06-06 DIAGNOSIS — K7581 Nonalcoholic steatohepatitis (NASH): Secondary | ICD-10-CM

## 2015-06-06 DIAGNOSIS — N186 End stage renal disease: Secondary | ICD-10-CM | POA: Diagnosis present

## 2015-06-06 DIAGNOSIS — K7682 Hepatic encephalopathy: Secondary | ICD-10-CM | POA: Diagnosis present

## 2015-06-06 LAB — CBC WITH DIFFERENTIAL/PLATELET
BASOS ABS: 0 10*3/uL (ref 0.0–0.1)
BASOS ABS: 0 10*3/uL (ref 0.0–0.1)
BASOS PCT: 0 %
Basophils Relative: 0 %
Eosinophils Absolute: 0 10*3/uL (ref 0.0–0.7)
Eosinophils Absolute: 0 10*3/uL (ref 0.0–0.7)
Eosinophils Relative: 0 %
Eosinophils Relative: 0 %
HEMATOCRIT: 26.6 % — AB (ref 36.0–46.0)
HEMATOCRIT: 26.5 % — AB (ref 36.0–46.0)
HEMOGLOBIN: 8.8 g/dL — AB (ref 12.0–15.0)
HEMOGLOBIN: 8.7 g/dL — AB (ref 12.0–15.0)
LYMPHS ABS: 1.1 10*3/uL (ref 0.7–4.0)
LYMPHS PCT: 10 %
Lymphocytes Relative: 10 %
Lymphs Abs: 1.1 10*3/uL (ref 0.7–4.0)
MCH: 31.4 pg (ref 26.0–34.0)
MCH: 32.1 pg (ref 26.0–34.0)
MCHC: 33.1 g/dL (ref 30.0–36.0)
MCHC: 32.8 g/dL (ref 30.0–36.0)
MCV: 95 fL (ref 78.0–100.0)
MCV: 97.8 fL (ref 78.0–100.0)
MONOS PCT: 4 %
Monocytes Absolute: 0.4 10*3/uL (ref 0.1–1.0)
Monocytes Absolute: 0.4 10*3/uL (ref 0.1–1.0)
Monocytes Relative: 4 %
NEUTROS ABS: 9 10*3/uL — AB (ref 1.7–7.7)
NEUTROS ABS: 9.8 10*3/uL — AB (ref 1.7–7.7)
NEUTROS PCT: 86 %
NEUTROS PCT: 87 %
Platelets: 119 10*3/uL — ABNORMAL LOW (ref 150–400)
Platelets: 104 10*3/uL — ABNORMAL LOW (ref 150–400)
RBC: 2.8 MIL/uL — AB (ref 3.87–5.11)
RBC: 2.71 MIL/uL — AB (ref 3.87–5.11)
RDW: 18 % — ABNORMAL HIGH (ref 11.5–15.5)
RDW: 18.6 % — ABNORMAL HIGH (ref 11.5–15.5)
WBC: 10.5 10*3/uL (ref 4.0–10.5)
WBC: 11.3 10*3/uL — AB (ref 4.0–10.5)

## 2015-06-06 LAB — CBC
HCT: 24.9 % — ABNORMAL LOW (ref 36.0–46.0)
HEMOGLOBIN: 8.3 g/dL — AB (ref 12.0–15.0)
MCH: 32 pg (ref 26.0–34.0)
MCHC: 33.3 g/dL (ref 30.0–36.0)
MCV: 96.1 fL (ref 78.0–100.0)
Platelets: 111 10*3/uL — ABNORMAL LOW (ref 150–400)
RBC: 2.59 MIL/uL — AB (ref 3.87–5.11)
RDW: 18.4 % — ABNORMAL HIGH (ref 11.5–15.5)
WBC: 8.2 10*3/uL (ref 4.0–10.5)

## 2015-06-06 LAB — BASIC METABOLIC PANEL
ANION GAP: 9 (ref 5–15)
BUN: 83 mg/dL — AB (ref 6–20)
CHLORIDE: 98 mmol/L — AB (ref 101–111)
CO2: 20 mmol/L — ABNORMAL LOW (ref 22–32)
Calcium: 8.2 mg/dL — ABNORMAL LOW (ref 8.9–10.3)
Creatinine, Ser: 3.7 mg/dL — ABNORMAL HIGH (ref 0.44–1.00)
GFR calc Af Amer: 16 mL/min — ABNORMAL LOW (ref >60)
GFR calc non Af Amer: 14 mL/min — ABNORMAL LOW (ref >60)
Glucose, Bld: 125 mg/dL — ABNORMAL HIGH (ref 65–99)
POTASSIUM: 5.4 mmol/L — AB (ref 3.5–5.1)
SODIUM: 127 mmol/L — AB (ref 135–145)

## 2015-06-06 LAB — MAGNESIUM: MAGNESIUM: 2.3 mg/dL (ref 1.7–2.4)

## 2015-06-06 LAB — TYPE AND SCREEN
ABO/RH(D): A POS
ANTIBODY SCREEN: NEGATIVE
UNIT DIVISION: 0

## 2015-06-06 LAB — HEPATITIS B SURFACE ANTIGEN: HEP B S AG: NEGATIVE

## 2015-06-06 LAB — PHOSPHORUS: Phosphorus: 7.7 mg/dL — ABNORMAL HIGH (ref 2.5–4.6)

## 2015-06-06 LAB — GLUCOSE, CAPILLARY: Glucose-Capillary: 87 mg/dL (ref 65–99)

## 2015-06-06 MED ORDER — DARBEPOETIN ALFA 60 MCG/0.3ML IJ SOSY
PREFILLED_SYRINGE | INTRAMUSCULAR | Status: AC
  Filled 2015-06-06: qty 0.3

## 2015-06-06 MED ORDER — RENA-VITE PO TABS
1.0000 | ORAL_TABLET | Freq: Every day | ORAL | Status: DC
Start: 2015-06-06 — End: 2015-06-09
  Administered 2015-06-06 – 2015-06-07 (×2): 1 via ORAL
  Filled 2015-06-06 (×4): qty 1

## 2015-06-06 MED ORDER — CALCIUM ACETATE (PHOS BINDER) 667 MG PO CAPS
667.0000 mg | ORAL_CAPSULE | Freq: Three times a day (TID) | ORAL | Status: DC
  Administered 2015-06-06 – 2015-06-09 (×8): 667 mg via ORAL
  Filled 2015-06-06 (×9): qty 1

## 2015-06-06 MED ORDER — OXYCODONE HCL 5 MG PO TABS
10.0000 mg | ORAL_TABLET | Freq: Three times a day (TID) | ORAL | Status: DC | PRN
  Administered 2015-06-06 – 2015-06-07 (×4): 10 mg via ORAL
  Filled 2015-06-06 (×4): qty 2

## 2015-06-06 MED ORDER — MIDODRINE HCL 5 MG PO TABS
ORAL_TABLET | ORAL | Status: AC
  Filled 2015-06-06: qty 2

## 2015-06-06 NOTE — Clinical Documentation Improvement (Signed)
Nephrology/Renal Critical Care Gastroenterology  Can the diagnosis of anemia be further specified?   Acute blood loss anemia  Anemia of chronic disease, including the associated chronic disease state  Other  Clinically Undetermined  Supporting Information: -- Hematemesis and melena -- EGD showed esophageal varices with bleeding -- Hgb 5.1 requiring 2u PRBC on admit  Please exercise your independent, professional judgment when responding. A specific answer is not anticipated or expected.   Thank You,  Ezekiel Ina RN [place] [phone]

## 2015-06-06 NOTE — Progress Notes (Signed)
Wanda [name] 3:50 PM  Subjective: Patient doing better without any specific complaints and no signs of bleeding and no obvious post endoscopy problems  Objective: Vital signs stable afebrile no acute distress abdomen is soft nontender probable ascites hemoglobin stable  Assessment: Cirrhosis and recent variceal bleeding status post banding  Plan: Clear liquids today if no signs of bleeding slowly advance diet tomorrow and on discharge please see primary gastroenterology note on computer from Dr. Oneida Alar for follow-up and consider low-dose beta blocker and continuing Carafate as well and possibly TIPS at some point  [facility] E  Pager [phone] After 5PM or if no answer call [phone]

## 2015-06-06 NOTE — Progress Notes (Signed)
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 Unclear whether or not this is still an option for her. 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   06/04/2015 she was in her usual state of health on the time present for dialysis. Very shortly after her dialysis treatment she developed syncope associated with hypotension. She was transported to [facility] emergency department and found to be hypotensive with systolic blood pressures in the 60s. She was also found to be anemic with hemoglobin 5.1 requiring 2 units PRBC transfusion. She was essentially unresponsive time of presentation however after blood transfusion her mental status has improved. Neurology Lovie Macadamia was contacted and felt as if the patient would benefit from higher level medical care of [place] boyfriend states that she is had multiple episodes of GI bleeding requiring blood transfusion already this year. He also notes that she experienced black stools last night. Currently she has no complaints of chest pain or shortness of breath, or dizziness. She does complain of some diffuse abdominal pain which is better chronic problem over the past few months, however, I suspect  she is a poor historian given her current illness.  HPI/Subjective: 10/27 A/O 4, complains of hunger. States she's been seen one time at [facility] but unsure of when her future appointments are for continuation of transplant evaluation.  Assessment/Plan: Hemorrhagic/hypovolemic shock  -Transfused 2 units PRBC -Resolved  Acute GI bleeding (hemoccult positive at [facility]) -Secondary to varices, S/P EGD with banding see report below -Per GI clear liquids -Will start beta blockers if/when BP tolerates - Carafate 1 gm TID -Continue octreotide drip per GI  -continue transplant evaluation at [facility] in the future as well  Liver cirrhosis secondary to NASH -Restart patient's home pain medication regimen oxycodone 10 mg TID PRN -Holding lactulose -Trend ammonia level -Protonix 40 mg BID  Acute metabolic vs Hepatic encephalopathy -See liver cirrhosis  Acute blood loss on chronic macrocytic anemia -Stable  Mild thrombocytopenia -Stable  ESRD on HD T/T/S -Per nephrology     Code Status: FULL Family Communication: no family present at time of exam Disposition Plan: Per GI    Consultants: Dr.Marc Magod (GI)  Dr.Michael Mercy Moore (nephrology)   Procedure/Significant Events: 10/26 EGD ;-. Tiny hiatal hernia  -. Minimal GE junction varices with small red spot which bled at end of procedure S/P 4 bands placed- Mild proximal portal gastropathy    Culture 10/26 MRSA by PCR negative   Antibiotics:   DVT prophylaxis: SCD   Devices    LINES / TUBES:      Continuous Infusions: . octreotide  (SANDOSTATIN)    IV infusion 50 mcg/hr (06/06/15 1814)  . pantoprozole (PROTONIX) infusion 8 mg/hr (06/06/15 1600)    Objective: VITAL SIGNS: Temp: 98.5 F (36.9 C) (10/27 1515) Temp Source: Oral (10/27 1515) BP: 108/59 mmHg (  10/27 1515) Pulse Rate: 66 (10/27 1515) SPO2; FIO2:   Intake/Output Summary (Last 24 hours) at 06/06/15 1848 Last data filed at 06/06/15  1700  Gross per 24 hour  Intake   2359 ml  Output   2250 ml  Net    109 ml     Exam: General: A/O 4, cachectic, mild jaundice, No acute respiratory distress Eyes: Negative headache, eye pain, double vision,negative scleral hemorrhage ENT: Negative Runny nose, negative ear pain, negative gingival bleeding, Neck:  Negative scars, masses, torticollis, lymphadenopathy, JVD Lungs: Clear to auscultation bilaterally without wheezes or crackles Cardiovascular: Regular rate and rhythm without murmur gallop or rub normal S1 and S2 Abdomen: positive abdominal pain LUQ/LLQ, nondistended, positive soft, bowel sounds, no rebound, no ascites, no appreciable mass Extremities: No significant cyanosis, clubbing. Mild bilateral lower extremity edema 1+ Psychiatric:  Negative depression, negative anxiety, negative fatigue, negative mania Neurologic:  Cranial nerves II through XII intact, tongue/uvula midline, all extremities muscle strength 5/5, sensation intact throughout, negative dysarthria, negative expressive aphasia, negative receptive aphasia.   Data Reviewed: Basic Metabolic Panel:  Recent Labs Lab 06/04/15 2306 06/05/15 0421 06/06/15 0558  NA 132* 132* 127*  K 4.0 4.1 5.4*  CL 96* 99* 98*  CO2 26 24 20*  GLUCOSE 88 84 125*  BUN 85* 83* 83*  CREATININE 3.57* 3.54* 3.70*  CALCIUM 8.0* 7.8* 8.2*  MG 2.6* 2.3 2.3  PHOS 6.1* 6.4* 7.7*   Liver Function Tests:  Recent Labs Lab 06/04/15 2306  AST 89*  ALT 31  ALKPHOS 56  BILITOT 1.7*  PROT 4.8*  ALBUMIN 2.2*   No results for input(s): LIPASE, AMYLASE in the last 168 hours.  Recent Labs Lab 06/04/15 2306  AMMONIA 61*   CBC:  Recent Labs Lab 06/05/15 0420 06/05/15 0812 06/05/15 2140 06/06/15 0558 06/06/15 1000  WBC 7.5 7.5 7.3 8.2 10.5  NEUTROABS  --  4.8 6.6  --  9.0*  HGB 7.5* 7.9* 8.3* 8.3* 8.8*  HCT 22.4* 23.5* 25.4* 24.9* 26.6*  MCV 94.9 94.8 95.1 96.1 95.0  PLT 103* 113* 115* 111* 119*   Cardiac  Enzymes:  Recent Labs Lab 06/04/15 2306 06/05/15 0421 06/05/15 0940  TROPONINI <0.03 <0.03 <0.03   BNP (last 3 results) No results for input(s): BNP in the last 8760 hours.  ProBNP (last 3 results) No results for input(s): PROBNP in the last 8760 hours.  CBG:  Recent Labs Lab 06/04/15 2148 06/05/15 0407 06/05/15 0813 06/05/15 1158  GLUCAP 86 84 87 91    Recent Results (from the past 240 hour(s))  MRSA PCR Screening     Status: None   Collection Time: 06/05/15  4:15 AM  Result Value Ref Range Status   MRSA by PCR NEGATIVE NEGATIVE Final    Comment:        The GeneXpert MRSA Assay (FDA approved for NASAL specimens only), is one component of a comprehensive MRSA colonization surveillance program. It is not intended to diagnose MRSA infection nor to guide or monitor treatment for MRSA infections.      Studies:  Recent x-ray studies have been reviewed in detail by the Attending Physician  Scheduled Meds:  Scheduled Meds: . calcium acetate  667 mg Oral TID WC  . darbepoetin (ARANESP) injection - DIALYSIS  60 mcg Intravenous Q Thu-HD  . hydrocortisone sodium succinate  50 mg Intravenous 4 times per day  . midodrine  10 mg Oral Q T,Th,Sa-HD  . multivitamin  1 tablet Oral QHS  . [  START ON 06/08/2015] pantoprazole (PROTONIX) IV  40 mg Intravenous Q12H  . sucralfate  1 g Oral TID WC & HS    Time spent on care of this patient: 40 mins   WOODS, Geraldo Docker , MD  [facility] Office  [phone] Pager - [phone]  On-Call/Text Page:      [email or web address]      password [email or web address]  If 7PM-7AM, please contact night-coverage [email or web address] Password [email or web address] 06/06/2015, 6:48 PM   LOS: 2 days   Care during the described time interval was provided by me .  I have reviewed this patient's available data, including medical history, events of note, physical examination, and all test results as part of my evaluation. I have personally reviewed and interpreted all radiology  studies.   Dia Crawford, MD [phone] Pager

## 2015-06-06 NOTE — Telephone Encounter (Signed)
I called and spoke with Wanda [name]. Pt is at [facility]. She is doing some better he said. She had dialysis today. Not sure yet when she will be going home. He will call to schedule the appt when she is discharged. He said she already has an appt on 07/24/2015 and I told him Dr. Oneida Alar said she needs to come in in 4 weeks. I will let her know and he will call when she is discharged.

## 2015-06-06 NOTE — Procedures (Signed)
Pt seen on HD.  Note small varices seen on EGD.  Hg stable.  Ap 200 Vp 170.  Did not get midodrine pre HD.  Try for 2L.

## 2015-06-07 DIAGNOSIS — D62 Acute posthemorrhagic anemia: Secondary | ICD-10-CM

## 2015-06-07 DIAGNOSIS — N186 End stage renal disease: Secondary | ICD-10-CM

## 2015-06-07 DIAGNOSIS — Z992 Dependence on renal dialysis: Secondary | ICD-10-CM

## 2015-06-07 DIAGNOSIS — K7290 Hepatic failure, unspecified without coma: Secondary | ICD-10-CM

## 2015-06-07 LAB — PROTIME-INR
INR: 1.23 (ref 0.00–1.49)
PROTHROMBIN TIME: 15.6 s — AB (ref 11.6–15.2)

## 2015-06-07 LAB — CBC
HEMATOCRIT: 26.2 % — AB (ref 36.0–46.0)
HEMOGLOBIN: 8.3 g/dL — AB (ref 12.0–15.0)
MCH: 31 pg (ref 26.0–34.0)
MCHC: 31.7 g/dL (ref 30.0–36.0)
MCV: 97.8 fL (ref 78.0–100.0)
PLATELETS: 111 10*3/uL — AB (ref 150–400)
RBC: 2.68 MIL/uL — ABNORMAL LOW (ref 3.87–5.11)
RDW: 18.4 % — AB (ref 11.5–15.5)
WBC: 8.7 10*3/uL (ref 4.0–10.5)

## 2015-06-07 LAB — COMPREHENSIVE METABOLIC PANEL
ALBUMIN: 2.4 g/dL — AB (ref 3.5–5.0)
ALT: 30 U/L (ref 14–54)
ANION GAP: 11 (ref 5–15)
AST: 61 U/L — ABNORMAL HIGH (ref 15–41)
Alkaline Phosphatase: 58 U/L (ref 38–126)
BILIRUBIN TOTAL: 1.3 mg/dL — AB (ref 0.3–1.2)
BUN: 42 mg/dL — ABNORMAL HIGH (ref 6–20)
CO2: 22 mmol/L (ref 22–32)
Calcium: 8.6 mg/dL — ABNORMAL LOW (ref 8.9–10.3)
Chloride: 97 mmol/L — ABNORMAL LOW (ref 101–111)
Creatinine, Ser: 2.97 mg/dL — ABNORMAL HIGH (ref 0.44–1.00)
GFR calc Af Amer: 21 mL/min — ABNORMAL LOW (ref >60)
GFR, EST NON AFRICAN AMERICAN: 19 mL/min — AB (ref >60)
GLUCOSE: 144 mg/dL — AB (ref 65–99)
POTASSIUM: 4.9 mmol/L (ref 3.5–5.1)
Sodium: 130 mmol/L — ABNORMAL LOW (ref 135–145)
TOTAL PROTEIN: 5.3 g/dL — AB (ref 6.5–8.1)

## 2015-06-07 LAB — AMMONIA: Ammonia: 82 umol/L — ABNORMAL HIGH (ref 9–35)

## 2015-06-07 MED ORDER — PROPRANOLOL HCL 10 MG PO TABS
5.0000 mg | ORAL_TABLET | Freq: Three times a day (TID) | ORAL | Status: DC
  Administered 2015-06-08: 5 mg via ORAL
  Filled 2015-06-07 (×9): qty 0.5

## 2015-06-07 MED ORDER — PROMETHAZINE HCL 25 MG/ML IJ SOLN: 12.5000 mg | Freq: Four times a day (QID) | INTRAMUSCULAR | Status: DC | PRN

## 2015-06-07 MED ORDER — LACTULOSE 10 GM/15ML PO SOLN
10.0000 g | Freq: Two times a day (BID) | ORAL | Status: DC
  Administered 2015-06-07: 10 g via ORAL
  Filled 2015-06-07 (×3): qty 15

## 2015-06-07 MED ORDER — PANTOPRAZOLE SODIUM 40 MG PO TBEC
40.0000 mg | DELAYED_RELEASE_TABLET | Freq: Two times a day (BID) | ORAL | Status: DC
  Administered 2015-06-07 – 2015-06-09 (×3): 40 mg via ORAL
  Filled 2015-06-07 (×4): qty 1

## 2015-06-07 MED ORDER — SODIUM CHLORIDE 0.9 % IV SOLN
50.0000 ug/h | INTRAVENOUS | Status: DC
  Filled 2015-06-07 (×3): qty 1

## 2015-06-07 MED ORDER — PROPRANOLOL HCL 10 MG PO TABS: 10.0000 mg | ORAL_TABLET | Freq: Three times a day (TID) | ORAL | Status: DC

## 2015-06-07 NOTE — Progress Notes (Signed)
Report called to Nira Conn, RN on 6E. Patient to be transferred to [place] via wheelchair by [place], [place]. No family at bedside. All belongings sent with patient.

## 2015-06-07 NOTE — Progress Notes (Signed)
S: No further bleeding O:BP 114/53 mmHg  Pulse 58  Temp(Src) 97.8 F (36.6 C) (Oral)  Resp 15  Ht 5\' 2"  (1.575 m)  Wt 58.2 kg (128 lb 4.9 oz)  BMI 23.46 kg/m2  SpO2 100%  LMP 09/19/2013  Intake/Output Summary (Last 24 hours) at 06/07/15 0829 Last data filed at 06/07/15 0204  Gross per 24 hour  Intake   2307 ml  Output   2150 ml  Net    157 ml   Weight change: -14.376 kg (-31 lb 11.1 oz) FXO:VANVB and alert CVS:Sl brady, reg  Resp:Clear Abd:+ BS NT + ascites Ext: tr edema   RUA AVF + bruit NEURO: CNI Ox3 no asterixis   . calcium acetate  667 mg Oral TID WC  . darbepoetin (ARANESP) injection - DIALYSIS  60 mcg Intravenous Q Thu-HD  . hydrocortisone sodium succinate  50 mg Intravenous 4 times per day  . midodrine  10 mg Oral Q T,Th,Sa-HD  . multivitamin  1 tablet Oral QHS  . [START ON 06/08/2015] pantoprazole (PROTONIX) IV  40 mg Intravenous Q12H  . sucralfate  1 g Oral TID WC & HS   No results found. BMET    Component Value Date/Time   NA 130* 06/07/2015 0600   K 4.9 06/07/2015 0600   CL 97* 06/07/2015 0600   CO2 22 06/07/2015 0600   GLUCOSE 144* 06/07/2015 0600   BUN 42* 06/07/2015 0600   CREATININE 2.97* 06/07/2015 0600   CREATININE 1.46* 06/13/2014 0959   CALCIUM 8.6* 06/07/2015 0600   GFRNONAA 19* 06/07/2015 0600   GFRAA 21* 06/07/2015 0600   CBC    Component Value Date/Time   WBC 8.7 06/07/2015 0600   RBC 2.68* 06/07/2015 0600   RBC 2.31* 11/06/2013 1333   HGB 8.3* 06/07/2015 0600   HCT 26.2* 06/07/2015 0600   PLT 111* 06/07/2015 0600   MCV 97.8 06/07/2015 0600   MCH 31.0 06/07/2015 0600   MCHC 31.7 06/07/2015 0600   RDW 18.4* 06/07/2015 0600   LYMPHSABS 1.1 06/06/2015 2315   MONOABS 0.4 06/06/2015 2315   EOSABS 0.0 06/06/2015 2315   BASOSABS 0.0 06/06/2015 2315     Assessment: 1. GIB sec varices SP banding 2. ESRD 3. Cirrhosis sec NASH 4. Anemia 5. Sec HPTH  Plan: 1. Plan HD tomorrow 2.  Does she still need octreotide and IV  PPI? 3. Advance diet 4. Wean steroids   [name]

## 2015-06-07 NOTE — Care Management Important Message (Signed)
Important Message  Patient Details  Name: Wanda [name] MRN: [id number] Date of Birth: May 03, 1976   Medicare Important Message Given:  Yes-second notification given    Nathen May 06/07/2015, 11:51 AM

## 2015-06-07 NOTE — Progress Notes (Signed)
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   06/04/2015 very shortly after her dialysis treatment she developed syncope associated with hypotension. She was transported to [facility] emergency department and found to be hypotensive with systolic blood pressures in the 60s. She was also found to be anemic with hemoglobin 5.1 requiring 2 units PRBC transfusion. She was essentially unresponsive. Live-in boyfriend states that she has had multiple episodes of GI bleeding requiring blood transfusion already this year. He also notes that she experienced black stools the prior night.   HPI/Subjective: The patient is resting comfortably in a bedside chair.  She complains of some diffuse cramping abdominal pain and low back pain.  She denies chest pain or shortness of breath.  Assessment/Plan:  Hemorrhagic / hypovolemic shock  -Transfused 2 units PRBC - due to bleeding from esophageal varices -Resolved  Acute GI bleeding (hemoccult positive at [facility]) -Secondary to varices S/P EGD with banding  -advance diet as tolerated  -begin propranolol and follow  -Carafate 1 gm TID -continue transplant evaluation at [facility] in the future if still a possibility   Liver cirrhosis secondary to NASH -Restart patient's home pain medication regimen oxycodone 10 mg TID  PRN -Resume lactulose w/ climbing ammonia  -Protonix 40 mg BID  Acute metabolic vs Hepatic encephalopathy -resume lactulose and follow   Acute blood loss on chronic macrocytic anemia -Stable  Mild thrombocytopenia -Stable  ESRD on HD T/T/S -Per nephrology  Code Status: FULL Family Communication: no family present at time of exam Disposition Plan: transfer to neph bed - possible d/c next 24-48hrs   Consultants: [facility]   Procedures: 10/26 EGD - Tiny hiatal hernia - Minimal GE junction varices with small red spot which bled at end of procedure S/P 4 bands placed - Mild proximal portal gastropathy   Antibiotics: none  DVT prophylaxis: SCDs  Objective: Blood pressure 124/51, pulse 51, temperature 97.9 F (36.6 C), temperature source Oral, resp. rate 12, height 5\' 2"  (1.575 m), weight 58.2 kg (128 lb 4.9 oz), last menstrual period 09/19/2013, SpO2 93 %.  Intake/Output Summary (Last 24 hours) at 06/07/15 1603 Last data filed at 06/07/15 1400  Gross per 24 hour  Intake   1961 ml  Output    150 ml  Net   1811 ml   Exam: General: No acute respiratory distress Lungs: Clear to auscultation bilaterally without wheezes or crackles Cardiovascular: Regular rate and rhythm without murmur gallop or rub normal S1 and S2 Abdomen: Nontender, nondistended, soft, bowel sounds positive, no rebound, no ascites, no appreciable mass Extremities: No significant cyanosis, clubbing, or edema bilateral lower extremities  Data Reviewed: Basic Metabolic Panel:  Recent Labs Lab 06/04/15 2306 06/05/15 0421 06/06/15 0558 06/07/15 0600  NA 132* 132* 127* 130*  K 4.0 4.1 5.4* 4.9  CL 96* 99* 98* 97*  CO2 26 24 20* 22  GLUCOSE 88 84 125* 144*  BUN 85* 83* 83* 42*  CREATININE 3.57* 3.54* 3.70* 2.97*  CALCIUM 8.0* 7.8* 8.2* 8.6*  MG 2.6* 2.3 2.3  --   PHOS 6.1* 6.4* 7.7*  --     CBC:  Recent Labs Lab 06/05/15 0812 06/05/15 2140 06/06/15 0558 06/06/15 1000  06/06/15 2315 06/07/15 0600  WBC 7.5 7.3 8.2 10.5 11.3* 8.7  NEUTROABS 4.8 6.6  --  9.0* 9.8*  --   HGB 7.9* 8.3* 8.3* 8.8* 8.7* 8.3*  HCT 23.5* 25.4* 24.9* 26.6* 26.5* 26.2*  MCV 94.8 95.1 96.1 95.0 97.8 97.8  PLT 113* 115* 111* 119* 104* 111*    Liver Function Tests:  Recent Labs Lab 06/04/15 2306 06/07/15 0600  AST 89* 61*  ALT 31 30  ALKPHOS 56 58  BILITOT 1.7* 1.3*  PROT 4.8* 5.3*  ALBUMIN 2.2* 2.4*    Recent Labs Lab 06/04/15 2306 06/07/15 0600  AMMONIA 61* 82*   Coags:  Recent Labs Lab 06/04/15 2306 06/07/15 0600  INR 1.29 1.23   Cardiac Enzymes:  Recent Labs Lab 06/04/15 2306 06/05/15 0421 06/05/15 0940  TROPONINI <0.03 <0.03 <0.03    CBG:  Recent Labs Lab 06/04/15 2148 06/05/15 0407 06/05/15 0813 06/05/15 1158  GLUCAP 86 84 87 91    Recent Results (from the past 240 hour(s))  MRSA PCR Screening     Status: None   Collection Time: 06/05/15  4:15 AM  Result Value Ref Range Status   MRSA by PCR NEGATIVE NEGATIVE Final    Comment:        The GeneXpert MRSA Assay (FDA approved for NASAL specimens only), is one component of a comprehensive MRSA colonization surveillance program. It is not intended to diagnose MRSA infection nor to guide or monitor treatment for MRSA infections.      Studies:   Recent x-ray studies have been reviewed in detail by the Attending Physician  Scheduled Meds:  Scheduled Meds: . calcium acetate  667 mg Oral TID WC  . darbepoetin (ARANESP) injection - DIALYSIS  60 mcg Intravenous Q Thu-HD  . hydrocortisone sodium succinate  50 mg Intravenous 4 times per day  . midodrine  10 mg Oral Q T,Th,Sa-HD  . multivitamin  1 tablet Oral QHS  . pantoprazole  40 mg Oral BID AC  . sucralfate  1 g Oral TID WC & HS    Time spent on care of this patient: 35 mins   MCCLUNG,JEFFREY T , MD   [facility] Office  [phone] Pager - Text Page per Shea Evans as per below:  On-Call/Text Page:       [email or web address]      password [email or web address]  If 7PM-7AM, please contact night-coverage [email or web address] Password [email or web address] 06/07/2015, 4:03 PM   LOS: 3 days

## 2015-06-08 LAB — GLUCOSE, CAPILLARY: Glucose-Capillary: 99 mg/dL (ref 65–99)

## 2015-06-08 LAB — RENAL FUNCTION PANEL
Albumin: 2.5 g/dL — ABNORMAL LOW (ref 3.5–5.0)
Anion gap: 16 — ABNORMAL HIGH (ref 5–15)
BUN: 61 mg/dL — AB (ref 6–20)
CHLORIDE: 99 mmol/L — AB (ref 101–111)
CO2: 17 mmol/L — AB (ref 22–32)
CREATININE: 4.02 mg/dL — AB (ref 0.44–1.00)
Calcium: 8.8 mg/dL — ABNORMAL LOW (ref 8.9–10.3)
GFR calc Af Amer: 15 mL/min — ABNORMAL LOW (ref >60)
GFR, EST NON AFRICAN AMERICAN: 13 mL/min — AB (ref >60)
GLUCOSE: 127 mg/dL — AB (ref 65–99)
POTASSIUM: 4.2 mmol/L (ref 3.5–5.1)
Phosphorus: 5 mg/dL — ABNORMAL HIGH (ref 2.5–4.6)
Sodium: 132 mmol/L — ABNORMAL LOW (ref 135–145)

## 2015-06-08 LAB — CBC
HCT: 28.5 % — ABNORMAL LOW (ref 36.0–46.0)
HEMOGLOBIN: 9.3 g/dL — AB (ref 12.0–15.0)
MCH: 32.3 pg (ref 26.0–34.0)
MCHC: 32.6 g/dL (ref 30.0–36.0)
MCV: 99 fL (ref 78.0–100.0)
Platelets: 124 10*3/uL — ABNORMAL LOW (ref 150–400)
RBC: 2.88 MIL/uL — AB (ref 3.87–5.11)
RDW: 17.7 % — ABNORMAL HIGH (ref 11.5–15.5)
WBC: 12 10*3/uL — ABNORMAL HIGH (ref 4.0–10.5)

## 2015-06-08 LAB — AMMONIA: AMMONIA: 87 umol/L — AB (ref 9–35)

## 2015-06-08 MED ORDER — RIFAXIMIN 550 MG PO TABS
550.0000 mg | ORAL_TABLET | Freq: Two times a day (BID) | ORAL | Status: DC
  Administered 2015-06-08 – 2015-06-09 (×2): 550 mg via ORAL
  Filled 2015-06-08 (×2): qty 1

## 2015-06-08 MED ORDER — PROMETHAZINE HCL 25 MG/ML IJ SOLN: 6.2500 mg | Freq: Four times a day (QID) | INTRAMUSCULAR | Status: DC | PRN

## 2015-06-08 MED ORDER — LACTULOSE 10 GM/15ML PO SOLN
10.0000 g | Freq: Two times a day (BID) | ORAL | Status: DC
  Administered 2015-06-08 – 2015-06-09 (×2): 10 g via ORAL
  Filled 2015-06-08 (×2): qty 15

## 2015-06-08 MED ORDER — OXYCODONE HCL 5 MG PO TABS
5.0000 mg | ORAL_TABLET | Freq: Three times a day (TID) | ORAL | Status: DC | PRN
  Administered 2015-06-09 (×2): 5 mg via ORAL
  Filled 2015-06-08 (×2): qty 1

## 2015-06-08 MED ORDER — LACTULOSE ENEMA
300.0000 mL | Freq: Two times a day (BID) | ORAL | Status: DC
  Administered 2015-06-08: 300 mL via RECTAL
  Filled 2015-06-08 (×4): qty 300

## 2015-06-08 NOTE — Progress Notes (Addendum)
Patient ID: Wanda [name], female   DOB: 09-24-1975, 39 y.o.   MRN: [id number] [facility]astroenterology Progress Note  JOELIE SCHOU 39 y.o. 07/28/1976   Subjective: Somnolent. Not opening eyes to command and not talking. Sitter at bedside. S/P dialysis today.  Objective: Vital signs in last 24 hours: Filed Vitals:   06/08/15 1159  BP: 113/59  Pulse: 116  Temp: 97.7 F (36.5 C)  Resp: 18    Physical Exam: Gen: somnolent, well-nourished CV: RRR Chest: CTA B Abd: diffusely tender with minimal guarding, +distention, +BS Neuro: +asterixis  Lab Results:  Recent Labs  06/06/15 0558 06/07/15 0600 06/08/15 0820  NA 127* 130* 132*  K 5.4* 4.9 4.2  CL 98* 97* 99*  CO2 20* 22 17*  GLUCOSE 125* 144* 127*  BUN 83* 42* 61*  CREATININE 3.70* 2.97* 4.02*  CALCIUM 8.2* 8.6* 8.8*  MG 2.3  --   --   PHOS 7.7*  --  5.0*    Recent Labs  06/07/15 0600 06/08/15 0820  AST 61*  --   ALT 30  --   ALKPHOS 58  --   BILITOT 1.3*  --   PROT 5.3*  --   ALBUMIN 2.4* 2.5*    Recent Labs  06/06/15 1000 06/06/15 2315 06/07/15 0600 06/08/15 0820  WBC 10.5 11.3* 8.7 12.0*  NEUTROABS 9.0* 9.8*  --   --   HGB 8.8* 8.7* 8.3* 9.3*  HCT 26.6* 26.5* 26.2* 28.5*  MCV 95.0 97.8 97.8 99.0  PLT 119* 104* 111* 124*    Recent Labs  06/07/15 0600  LABPROT 15.6*  INR 1.23      Assessment/Plan: Decompensated cirrhosis with recent variceal bleeding s/p variceal banding, ascites and now with hepatic encephalopathy. Hgb stable. Start Lactulose enemas and hold PO Lactulose until after the first enema given. Start Rifaximin 550 mg BID. Continue supportive care. Will follow.   [place] C. 06/08/2015, 12:17 PM  Pager [phone]  If no answer or after 5 PM call [phone]

## 2015-06-08 NOTE — Procedures (Signed)
Pt seen on HD.  Hg better at 9.3.  Ap 140 Vp 120.  BFR 350.  Confused this AM.  Suspect encephalopathy from her liver disease.  Lactulose restarted.

## 2015-06-08 NOTE — Progress Notes (Signed)
Patient moved her bowels this am black tarry stools, medium and soft in consistency.

## 2015-06-08 NOTE — Progress Notes (Signed)
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   06/04/2015 very shortly after her dialysis treatment she developed syncope associated with hypotension. She was transported to [facility] emergency department and found to be hypotensive with systolic blood pressures in the 60s. She was also found to be anemic with hemoglobin 5.1 requiring 2 units PRBC transfusion. She was essentially unresponsive. Live-in boyfriend states that she has had multiple episodes of GI bleeding requiring blood transfusion already this year. He also notes that she experienced black stools the prior night.   HPI/Subjective: The patient has become more lethargic today, coincident with her rising ammonia levels.  Ammonia was held during the initial portion of her hospital stay.  She cannot provide a reliable history at the present time due to her obtundation.  She does not appear to be in respiratory distress or pain.  Assessment/Plan:  Hemorrhagic / hypovolemic shock  -Transfused 2 units PRBC - due to bleeding from esophageal varices -Resolved  Acute GI bleeding (hemoccult positive at [facility]) -Secondary to varices S/P EGD with banding  -advance diet as tolerated once mental status recovers  Acute blood loss on chronic macrocytic anemia -Hemoglobin now climbing  Liver cirrhosis  secondary to NASH  -continue transplant evaluation at [facility] in the future if still a possibility   Acute metabolic vs Hepatic encephalopathy -Transition to lactulose enemas given obtundation and follow for clinical improvement  Mild thrombocytopenia -Stable  ESRD on HD T/T/S -Per Nephrology - hemodialysis today on usual schedule  Code Status: FULL Family Communication: no family present at time of exam Disposition Plan: Resolution of hepatic encephalopathy   Consultants: [facility]   Procedures: 10/26 EGD - Tiny hiatal hernia - Minimal GE junction varices with small red spot which bled at end of procedure S/P 4 bands placed - Mild proximal portal gastropathy   Antibiotics: none  DVT prophylaxis: SCDs  Objective: Blood pressure 113/59, pulse 116, temperature 97.7 F (36.5 C), temperature source Axillary, resp. rate 18, height 5\' 2"  (1.575 m), weight 60.3 kg (132 lb 15 oz), last menstrual period 09/19/2013, SpO2 98 %.  Intake/Output Summary (Last 24 hours) at 06/08/15 1536 Last data filed at 06/08/15 1400  Gross per 24 hour  Intake    342 ml  Output   2500 ml  Net  -2158 ml   Exam: General: No acute respiratory distress - obtunded  Lungs: Clear to auscultation bilaterally without wheezes  Cardiovascular: Regular rate and rhythm without murmur gallop or rub Abdomen: Nondistended, soft, bowel sounds positive, no rebound, no appreciable mass Extremities: No significant cyanosis, clubbing, or edema bilateral lower extremities  Data Reviewed: Basic Metabolic Panel:  Recent Labs Lab 06/04/15 2306 06/05/15 0421 06/06/15 0558 06/07/15 0600 06/08/15 0820  NA 132* 132* 127* 130* 132*  K 4.0 4.1 5.4* 4.9 4.2  CL 96* 99* 98* 97* 99*  CO2 26 24 20* 22 17*  GLUCOSE 88 84 125* 144* 127*  BUN  85* 83* 83* 42* 61*  CREATININE 3.57* 3.54* 3.70* 2.97* 4.02*  CALCIUM 8.0* 7.8* 8.2* 8.6* 8.8*  MG 2.6* 2.3 2.3  --   --   PHOS 6.1* 6.4* 7.7*  --  5.0*     CBC:  Recent Labs Lab 06/05/15 0812 06/05/15 2140 06/06/15 0558 06/06/15 1000 06/06/15 2315 06/07/15 0600 06/08/15 0820  WBC 7.5 7.3 8.2 10.5 11.3* 8.7 12.0*  NEUTROABS 4.8 6.6  --  9.0* 9.8*  --   --   HGB 7.9* 8.3* 8.3* 8.8* 8.7* 8.3* 9.3*  HCT 23.5* 25.4* 24.9* 26.6* 26.5* 26.2* 28.5*  MCV 94.8 95.1 96.1 95.0 97.8 97.8 99.0  PLT 113* 115* 111* 119* 104* 111* 124*    Liver Function Tests:  Recent Labs Lab 06/04/15 2306 06/07/15 0600 06/08/15 0820  AST 89* 61*  --   ALT 31 30  --   ALKPHOS 56 58  --   BILITOT 1.7* 1.3*  --   PROT 4.8* 5.3*  --   ALBUMIN 2.2* 2.4* 2.5*    Recent Labs Lab 06/04/15 2306 06/07/15 0600 06/08/15 0959  AMMONIA 61* 82* 87*   Coags:  Recent Labs Lab 06/04/15 2306 06/07/15 0600  INR 1.29 1.23   Cardiac Enzymes:  Recent Labs Lab 06/04/15 2306 06/05/15 0421 06/05/15 0940  TROPONINI <0.03 <0.03 <0.03    CBG:  Recent Labs Lab 06/04/15 2148 06/05/15 0407 06/05/15 0813 06/05/15 1158  GLUCAP 86 84 87 91    Recent Results (from the past 240 hour(s))  MRSA PCR Screening     Status: None   Collection Time: 06/05/15  4:15 AM  Result Value Ref Range Status   MRSA by PCR NEGATIVE NEGATIVE Final    Comment:        The GeneXpert MRSA Assay (FDA approved for NASAL specimens only), is one component of a comprehensive MRSA colonization surveillance program. It is not intended to diagnose MRSA infection nor to guide or monitor treatment for MRSA infections.      Studies:   Recent x-ray studies have been reviewed in detail by the Attending Physician  Scheduled Meds:  Scheduled Meds: . calcium acetate  667 mg Oral TID WC  . darbepoetin (ARANESP) injection - DIALYSIS  60 mcg Intravenous Q Thu-HD  . lactulose  10 g Oral BID  . lactulose  300 mL Rectal BID  . midodrine  10 mg Oral Q T,Th,Sa-HD  . multivitamin  1 tablet Oral QHS  . pantoprazole  40 mg Oral BID AC  . propranolol  5 mg Oral TID  . rifaximin   550 mg Oral BID  . sucralfate  1 g Oral TID WC & HS    Time spent on care of this patient: 35 mins   MCCLUNG,JEFFREY T , MD   [facility] Office  [phone] Pager - Text Page per Shea Evans as per below:  On-Call/Text Page:      [email or web address]      password [email or web address]  If 7PM-7AM, please contact night-coverage [email or web address] Password [email or web address] 06/08/2015, 3:36 PM   LOS: 4 days

## 2015-06-08 NOTE — Progress Notes (Signed)
Refused lab to be drawn.

## 2015-06-08 NOTE — Progress Notes (Signed)
Called attention by NT. [name] Patient seen trying to sit on top oh her walker. NT assisted patient back to the bed. Refuses to lay down on her bed. " I am trying to get some sleep." Explained to patient she can not get some sleep while sitting on the edge on bed. Will continue to monitor. Patient was getting agitated when ask to lay on the bed.

## 2015-06-09 DIAGNOSIS — K7581 Nonalcoholic steatohepatitis (NASH): Secondary | ICD-10-CM

## 2015-06-09 LAB — COMPREHENSIVE METABOLIC PANEL
ALT: 33 U/L (ref 14–54)
ANION GAP: 10 (ref 5–15)
AST: 59 U/L — ABNORMAL HIGH (ref 15–41)
Albumin: 2.2 g/dL — ABNORMAL LOW (ref 3.5–5.0)
Alkaline Phosphatase: 53 U/L (ref 38–126)
BUN: 30 mg/dL — ABNORMAL HIGH (ref 6–20)
CHLORIDE: 101 mmol/L (ref 101–111)
CO2: 25 mmol/L (ref 22–32)
Calcium: 8 mg/dL — ABNORMAL LOW (ref 8.9–10.3)
Creatinine, Ser: 2.8 mg/dL — ABNORMAL HIGH (ref 0.44–1.00)
GFR calc non Af Amer: 20 mL/min — ABNORMAL LOW (ref >60)
GFR, EST AFRICAN AMERICAN: 23 mL/min — AB (ref >60)
Glucose, Bld: 72 mg/dL (ref 65–99)
POTASSIUM: 3.5 mmol/L (ref 3.5–5.1)
SODIUM: 136 mmol/L (ref 135–145)
Total Bilirubin: 1.3 mg/dL — ABNORMAL HIGH (ref 0.3–1.2)
Total Protein: 4.8 g/dL — ABNORMAL LOW (ref 6.5–8.1)

## 2015-06-09 LAB — CBC
HEMATOCRIT: 26 % — AB (ref 36.0–46.0)
HEMOGLOBIN: 8.4 g/dL — AB (ref 12.0–15.0)
MCH: 32.4 pg (ref 26.0–34.0)
MCHC: 32.3 g/dL (ref 30.0–36.0)
MCV: 100.4 fL — AB (ref 78.0–100.0)
Platelets: 87 10*3/uL — ABNORMAL LOW (ref 150–400)
RBC: 2.59 MIL/uL — AB (ref 3.87–5.11)
RDW: 17.5 % — ABNORMAL HIGH (ref 11.5–15.5)
WBC: 6.9 10*3/uL (ref 4.0–10.5)

## 2015-06-09 LAB — GLUCOSE, CAPILLARY: Glucose-Capillary: 96 mg/dL (ref 65–99)

## 2015-06-09 LAB — AMMONIA: AMMONIA: 163 umol/L — AB (ref 9–35)

## 2015-06-09 MED ORDER — RENA-VITE PO TABS: 1.0000 | ORAL_TABLET | Freq: Every day | ORAL | Status: DC

## 2015-06-09 MED ORDER — DARBEPOETIN ALFA 60 MCG/0.3ML IJ SOSY: 60.0000 ug | PREFILLED_SYRINGE | INTRAMUSCULAR | Status: AC

## 2015-06-09 MED ORDER — CALCIUM ACETATE (PHOS BINDER) 667 MG PO CAPS: 667.0000 mg | ORAL_CAPSULE | Freq: Three times a day (TID) | ORAL | Status: DC

## 2015-06-09 MED ORDER — MIDODRINE HCL 10 MG PO TABS: 10.0000 mg | ORAL_TABLET | Freq: Every day | ORAL | Status: DC

## 2015-06-09 MED ORDER — RIFAXIMIN 550 MG PO TABS: 550.0000 mg | ORAL_TABLET | Freq: Two times a day (BID) | ORAL | Status: DC

## 2015-06-09 MED ORDER — SUCRALFATE 1 G PO TABS: 1.0000 g | ORAL_TABLET | Freq: Three times a day (TID) | ORAL | Status: DC

## 2015-06-09 NOTE — Progress Notes (Signed)
Discharge instructions and medications discussed with patient.  Prescriptions given to patient.  All questions answered.  

## 2015-06-09 NOTE — Discharge Instructions (Signed)
Hepatic Encephalopathy Hepatic encephalopathy is a loss of brain function from advanced liver disease. The effects of the condition depend on the type of liver damage and how severe it is. In some cases, hepatic encephalopathy can be reversed. CAUSES The exact cause of hepatic encephalopathy is not known. RISK FACTORS You have a higher risk of getting this condition if your liver is damaged. When the liver is damaged harmful substances called toxins can build up in the body. Certain toxins, such as ammonia, can harm your brain. Conditions that can cause liver damage include:  An infection.  Dehydration.  Intestinal bleeding.  Drinking too much alcohol.  Taking certain medicines, including tranquilizers, water pills (diuretics), antidepressants, or sleeping pills. SIGNS AND SYMPTOMS Signs and symptoms may develop suddenly. Or, they may develop slowly and get worse gradually. Symptoms can range from mild to severe. Mild Hepatic Encephalopathy  Mild confusion.  Personality and mood changes.  Anxiety and agitation.  Drowsiness.  Loss of mental abilities.  Musty or sweet-smelling breath. Worsening or Severe Hepatic Encephalopathy  Slowed movement.  Slurred speech.  Extreme personality changes.  Disorientation.  Abnormal shaking or flapping of the hands.  Coma. DIAGNOSIS To make a diagnosis, your health care provider will do a physical exam. To rule out other causes of your signs and symptoms, he or she may order tests. You may have:  Blood tests. These may be done to check your ammonia level, measure how long it takes your blood to clot, and check for infection.  Liver function tests. These may be done to check how well your liver is working.  MRI and CT scans. These may be done to check for a brain disorder.  Electroencephalogram (EEG). This may be done to measure the electrical activity in your brain. TREATMENT The first step in treatment is identifying and  treating possible triggers. The next step is involves taking medicine to lower the level of toxins in the body and to prevent ammonia from building up. You may need to take:  Antibiotics to reduce the ammonia-producing bacteria in your gut.  Lactulose to help flush ammonia from the gut. HOME CARE INSTRUCTIONS Eating and Drinking  Follow a low-protein diet that includes plenty of fruits, vegetables, and whole grains, as directed by your health care provider. Ammonia is produced when you digest high-protein foods.  Work with a Microbiologist or with your health care provider to make sure you are getting the right balance of protein and minerals.  Drink enough fluids to keep your urine clear or pale yellow. Drinking plenty of water helps prevent constipation.  Do not drink alcohol or use illegal drugs. Medicines  Only take medicine as directed by your health care provider.  If you were prescribed an antibiotic medicine, finish it all even if you start to feel better.  Do not start any new medicines, including over-the-counter medicines, without first checking with your health care provider. SEEK MEDICAL CARE IF:  You have new symptoms.  Your symptoms change.  Your symptoms get worse.  You have a fever.  You are constipated.  You have persistent nausea, vomiting, or diarrhea. SEEK IMMEDIATE MEDICAL CARE IF:  You become very confused or drowsy.  You vomit blood or material that looks like coffee grounds.  Your stool is bloody or black or looks like tar.   This information is not intended to replace advice given to you by your health care provider. Make sure you discuss any questions you have with your health care provider.  Document Released: 10/06/2006 Document Revised: 08/17/2014 Document Reviewed: 03/14/2014 Elsevier Interactive Patient Education 2016 Elsevier Inc.  Esophageal Varices The esophagus is the passage that connects the throat to the stomach. Esophageal varices  are blood vessels in the esophagus that have become enlarged. They develop when extra blood is forced to flow through them because the blood's normal pathway is blocked. Without treatment these blood vessels eventually break and bleed (hemorrhage). A hemorrhage is life-threatening. CAUSES This condition may be caused by:  Scarring of the liver (cirrhosis) due to alcoholism. This is the most common cause.  Liver disease.  Severe heart failure.  A blood clot in the portal vein.  Sarcoidosis. This is an inflammatory disease that can affect the liver.  Schistosomiasis. This is a parasitic infection that can cause liver damage. SYMPTOMS Usually there are no symptoms unless the esophageal varices bleed. Symptoms of bleeding esophageal varices include:  Vomiting material that is bright red or that is black and looks like coffee grounds.  Coughing up blood.  Black, tarry stools.  Dizziness or lightheadedness.  Low blood pressure.  Loss of consciousness. DIAGNOSIS This condition is diagnosed with tests, such as:  Endoscopy. During this test a thin, lighted tube is inserted through the mouth and into the esophagus.  Blood tests. These may be done to check liver function, blood counts, and the body's ability to form blood clots. TREATMENT This condition may be treated with:  Medicines that reduce pressure in the esophageal varices and reduce the risk of bleeding.  Procedures to reduce pressure in the esophageal varices and reduce the risk of bleeding or stop bleeding. These include:  Variceal ligation. In this procedure, a rubber band is placed around the esophageal varices to keep them from bleeding.  Injection therapy. This treatment involves an injection that causes the esophageal varices to shrink and close (sclerotherapy). Medicines that tighten blood vessels or alter blood flow may also be used.  Balloon tamponade. In this procedure, a tube is put into the esophagus and a  balloon is passed through it and inflated.  Transjugular intrahepatic portosystemic shunt (TIPS) placement. In this procedure, a small tube is placed within the liver veins. This decreases blood flow and pressure to the esophageal varices.  A liver transplant. This may be done if other treatments do not work. HOME CARE INSTRUCTIONS  Take medicines only as directed by your health care provider.  Follow your health care provider's instructions about rest and physical activity. SEEK IMMEDIATE MEDICAL CARE IF:  You have any symptoms of this condition after treatment.  You are unable to eat or drink.  You have chest pain.   This information is not intended to replace advice given to you by your health care provider. Make sure you discuss any questions you have with your health care provider.   Document Released: 10/17/2003 Document Revised: 12/11/2014 Document Reviewed: 07/23/2014 Elsevier Interactive Patient Education [employer].

## 2015-06-09 NOTE — Progress Notes (Signed)
Patient ID: Wanda [name], female   DOB: 1975-09-07, 39 y.o.   MRN: [id number] [facility] Gastroenterology Progress Note  JESSEL GETTINGER 39 y.o. April 22, 1976   Subjective: Much more alert today. Black stools yesterday. No complaints. Wants to go home.  Objective: Vital signs: Filed Vitals:   06/09/15 0841  BP: 92/45  Pulse: 63  Temp: 98.4 F (36.9 C)  Resp: 20    Physical Exam: Gen: alert, no acute distress HEENT: anicteric CV: RRR Chest: CTA B Abd: diffusely tender with guarding, soft, +distention, +BS  Lab Results:  Recent Labs  06/08/15 0820 06/09/15 0630  NA 132* 136  K 4.2 3.5  CL 99* 101  CO2 17* 25  GLUCOSE 127* 72  BUN 61* 30*  CREATININE 4.02* 2.80*  CALCIUM 8.8* 8.0*  PHOS 5.0*  --     Recent Labs  06/07/15 0600 06/08/15 0820 06/09/15 0630  AST 61*  --  59*  ALT 30  --  33  ALKPHOS 58  --  53  BILITOT 1.3*  --  1.3*  PROT 5.3*  --  4.8*  ALBUMIN 2.4* 2.5* 2.2*    Recent Labs  06/06/15 2315  06/08/15 0820 06/09/15 0630  WBC 11.3*  < > 12.0* 6.9  NEUTROABS 9.8*  --   --   --   HGB 8.7*  < > 9.3* 8.4*  HCT 26.5*  < > 28.5* 26.0*  MCV 97.8  < > 99.0 100.4*  PLT 104*  < > 124* 87*  < > = values in this interval not displayed.    Assessment/Plan: 39 yo with decompensated cirrhosis with recent variceal bleed s/p EVBL and hepatic encephalopathy that has resolved with Lactulose/Rifaximin. OK to go home with f/u with Dr. Oneida Alar in 1-2 weeks. Needs to remain on Lactulose at home to titrate for 2-3 soft stools per day and also remain on Rifaximin. Follow a low sodium diet. Avoid alcohol.   [place] C. 06/09/2015, 11:58 AM  Pager [phone]  If no answer or after 5 PM call [phone]

## 2015-06-09 NOTE — Discharge Summary (Signed)
DISCHARGE SUMMARY  Wanda [name]  MR#: [id number]  DOB:18-Jun-1976  Date of Admission: 06/04/2015 Date of Discharge: 06/09/2015  Attending Physician:Sukhman Kocher T  Patient's [facility]B:[name]  Consults: [facility]   Disposition: D/C home   Follow-up Appts:     Follow-up Information    Follow up with [facility]. Schedule an appointment as soon as possible for a visit in 3 days.   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   06/04/2015 very shortly after her dialysis treatment she developed syncope associated with hypotension. She was transported to [facility] emergency department and found to be hypotensive with systolic blood pressures in the 60s. She was also found to be anemic with hemoglobin 5.1 requiring 2 units PRBC transfusion. She was essentially unresponsive. Live-in boyfriend stated that she has had multiple episodes of GI bleeding requiring blood transfusion already this year. He also notes that she experienced black  stools the prior night.   Hospital Course:  Procedures: 10/26 EGD - Tiny hiatal hernia - Minimal GE junction varices with small red spot which bled at end of procedure S/P 4 bands placed - Mild proximal portal gastropathy   Hemorrhagic / hypovolemic shock  -Transfused an apparent total of 1 unit of PRBC this admit, w/ possible additional 2U at [facility] ED - due to bleeding from esophageal varices -Resolved w/ banding of varices   Acute GI bleeding (hemoccult positive at [facility]) -secondary to varices S/P EGD with banding  -tolerating regular renal diet at time of d/c home   Acute blood loss on chronic macrocytic anemia -Hemoglobin stable at ~8.5 - for f/u during next HD tx   Liver cirrhosis secondary to NASH  -continue transplant evaluation at [facility] in the future if still a possibility   Acute metabolic vs Hepatic encephalopathy -Transitioned to lactulose enemas during period of obtundation- rapidly improved 10/30 - resume oral lactulose + rifaximin per GI suggestion - ammonia level clearly not consistently reflective of mental status and pt awake and alert at time of d/c despite markedly increased ammonia   Mild thrombocytopenia -Stable  ESRD on HD T/T/S -Per Nephrology - hemodialysis on usual schedule    Medication List    STOP taking these medications        diazepam 5 MG tablet  Commonly known as:  VALIUM     LORazepam 1 MG tablet  Commonly known as:  ATIVAN     metolazone 5 MG tablet  Commonly known as:  ZAROXOLYN     promethazine 12.5 MG tablet  Commonly known as:  PHENERGAN      TAKE these medications        calcium acetate  667 MG capsule  Commonly known as:  PHOSLO  Take 1 capsule (667 mg total) by mouth 3 (three) times daily with meals.     Darbepoetin Alfa 60 MCG/0.3ML Sosy injection  Commonly known as:  ARANESP  Inject 0.3 mLs (60 mcg total) into the vein every Thursday with hemodialysis.     lactulose 10 GM/15ML solution  Commonly known  as:  CHRONULAC  Take 15 mLs (10 g total) by mouth 2 (two) times daily.     lidocaine 2 % solution  Commonly known as:  XYLOCAINE  TAKE TWO TEASPOONSFUL (10ML) BY MOUTH BEFORE MEALS AND AT BEDTIME TO PREVENT CHEST PAIN WHILE EATING     lidocaine-prilocaine cream  Commonly known as:  EMLA  Apply 1 application topically See admin instructions. Applied prior to port access     midodrine 10 MG tablet  Commonly known as:  PROAMATINE  Take 1 tablet (10 mg total) by mouth daily. *Takes only on dialysis days on Tuesdays, Thursdays, and Saturdays (Sometimes takes treatments on Friday instead of Saturday)**Medication to keep your blood pressure from falling.     multivitamin Tabs tablet  Take 1 tablet by mouth at bedtime.     Oxycodone HCl 10 MG Tabs  1 PO TID AS NEEDED FOR PAIN     pantoprazole 40 MG tablet  Commonly known as:  PROTONIX  Take 1 tablet (40 mg total) by mouth 2 (two) times daily.     rifaximin 550 MG Tabs tablet  Commonly known as:  XIFAXAN  Take 1 tablet (550 mg total) by mouth 2 (two) times daily.     sucralfate 1 G tablet  Commonly known as:  CARAFATE  Take 1 tablet (1 g total) by mouth 4 (four) times daily -  with meals and at bedtime.       Day of Discharge BP 92/45 mmHg  Pulse 63  Temp(Src) 98.4 F (36.9 C) (Oral)  Resp 20  Ht 5\' 2"  (1.575 m)  Wt 60.6 kg (133 lb 9.6 oz)  BMI 24.43 kg/m2  SpO2 98%  LMP 09/19/2013  Physical Exam: General: No acute respiratory distress - alert and conversant  Lungs: Clear to auscultation bilaterally without wheezes or crackles Cardiovascular: Regular rate and rhythm without murmur gallop or rub Abdomen: Nontender, protuberent, soft, bowel sounds positive, no rebound, no ascites, no appreciable mass Extremities: No significant cyanosis, clubbing, or edema bilateral lower extremities  Basic Metabolic Panel:  Recent Labs Lab 06/04/15 2306 06/05/15 0421 06/06/15 0558 06/07/15 0600 06/08/15 0820 06/09/15 0630  NA  132* 132* 127* 130* 132* 136  K 4.0 4.1 5.4* 4.9 4.2 3.5  CL 96* 99* 98* 97* 99* 101  CO2 26 24 20* 22 17* 25  GLUCOSE 88 84 125* 144* 127* 72  BUN 85* 83* 83* 42* 61* 30*  CREATININE 3.57* 3.54* 3.70* 2.97* 4.02* 2.80*  CALCIUM 8.0* 7.8* 8.2* 8.6* 8.8* 8.0*  MG 2.6* 2.3 2.3  --   --   --   PHOS 6.1* 6.4* 7.7*  --  5.0*  --    Liver Function Tests:  Recent Labs Lab 06/04/15 2306 06/07/15 0600 06/08/15 0820 06/09/15 0630  AST 89* 61*  --  59*  ALT 31 30  --  33  ALKPHOS 56 58  --  53  BILITOT 1.7* 1.3*  --  1.3*  PROT 4.8* 5.3*  --  4.8*  ALBUMIN 2.2* 2.4* 2.5* 2.2*    Recent Labs Lab 06/04/15 2306 06/07/15 0600 06/08/15 0959  06/09/15 0630  AMMONIA 61* 82* 87* 163*   Coags:  Recent Labs Lab 06/04/15 2306 06/07/15 0600  INR 1.29 1.23   CBC:  Recent Labs Lab 06/05/15 0812 06/05/15 2140  06/06/15 1000 06/06/15 2315 06/07/15 0600 06/08/15 0820 06/09/15 0630  WBC 7.5 7.3  < > 10.5 11.3* 8.7 12.0* 6.9  NEUTROABS 4.8 6.6  --  9.0* 9.8*  --   --   --   HGB 7.9* 8.3*  < > 8.8* 8.7* 8.3* 9.3* 8.4*  HCT 23.5* 25.4*  < > 26.6* 26.5* 26.2* 28.5* 26.0*  MCV 94.8 95.1  < > 95.0 97.8 97.8 99.0 100.4*  PLT 113* 115*  < > 119* 104* 111* 124* 87*  < > = values in this interval not displayed.  Cardiac Enzymes:  Recent Labs Lab 06/04/15 2306 06/05/15 0421 06/05/15 0940  TROPONINI <0.03 <0.03 <0.03   CBG:  Recent Labs Lab 06/05/15 0407 06/05/15 0813 06/05/15 1158 06/08/15 2052 06/09/15 0753  GLUCAP 84 87 91 99 96    Recent Results (from the past 240 hour(s))  MRSA PCR Screening     Status: None   Collection Time: 06/05/15  4:15 AM  Result Value Ref Range Status   MRSA by PCR NEGATIVE NEGATIVE Final    Comment:        The GeneXpert MRSA Assay (FDA approved for NASAL specimens only), is one component of a comprehensive MRSA colonization surveillance program. It is not intended to diagnose MRSA infection nor to guide or monitor treatment for MRSA  infections.     Time spent in discharge (includes decision making & examination of pt): >35 minutes  06/09/2015, 12:12 PM   Cherene Altes, MD [facility] Office  [phone] Pager [phone]  On-Call/Text Page:      [email or web address]      password [facility]

## 2015-06-09 NOTE — Progress Notes (Signed)
S: No new CO.  Mentally much improved O:BP 128/48 mmHg  Pulse 65  Temp(Src) 98.3 F (36.8 C) (Oral)  Resp 20  Ht 5\' 2"  (1.575 m)  Wt 60.6 kg (133 lb 9.6 oz)  BMI 24.43 kg/m2  SpO2 94%  LMP 09/19/2013  Intake/Output Summary (Last 24 hours) at 06/09/15 0813 Last data filed at 06/09/15 0647  Gross per 24 hour  Intake    444 ml  Output   2725 ml  Net  -2281 ml   Weight change: -1.293 kg (-2 lb 13.6 oz) XIH:WTUUE and alert CVS:RRR Resp:Clear Abd:+ BS NT + ascites Ext: tr-1+ edema   RUA AVF + bruit NEURO: CNI Ox3 no asterixis   . calcium acetate  667 mg Oral TID WC  . darbepoetin (ARANESP) injection - DIALYSIS  60 mcg Intravenous Q Thu-HD  . lactulose  10 g Oral BID  . midodrine  10 mg Oral Q [name],Th,Sa-HD  . multivitamin  1 tablet Oral QHS  . pantoprazole  40 mg Oral BID AC  . propranolol  5 mg Oral TID  . rifaximin  550 mg Oral BID  . sucralfate  1 g Oral TID WC & HS   No results found. BMET    Component Value Date/Time   NA 132* 06/08/2015 0820   K 4.2 06/08/2015 0820   CL 99* 06/08/2015 0820   CO2 17* 06/08/2015 0820   GLUCOSE 127* 06/08/2015 0820   BUN 61* 06/08/2015 0820   CREATININE 4.02* 06/08/2015 0820   CREATININE 1.46* 06/13/2014 0959   CALCIUM 8.8* 06/08/2015 0820   GFRNONAA 13* 06/08/2015 0820   GFRAA 15* 06/08/2015 0820   CBC    Component Value Date/Time   WBC 6.9 06/09/2015 0630   RBC 2.59* 06/09/2015 0630   RBC 2.31* 11/06/2013 1333   HGB 8.4* 06/09/2015 0630   HCT 26.0* 06/09/2015 0630   PLT PENDING 06/09/2015 0630   MCV 100.4* 06/09/2015 0630   MCH 32.4 06/09/2015 0630   MCHC 32.3 06/09/2015 0630   RDW 17.5* 06/09/2015 0630   LYMPHSABS 1.1 06/06/2015 2315   MONOABS 0.4 06/06/2015 2315   EOSABS 0.0 06/06/2015 2315   BASOSABS 0.0 06/06/2015 2315     Assessment: 1. GIB sec varices SP banding 2. ESRD 3. Cirrhosis sec NASH 4. Anemia 5. Sec HPTH  Now on Ca acetate 6. Encephalopathy, improved  Plan: 1. Next HD Tues.  She wants to go  and she can go from renal standpoint if can walk and keep down breakfast  Wanda [name]

## 2015-06-10 NOTE — Telephone Encounter (Signed)
REVIEWED-NO ADDITIONAL RECOMMENDATIONS. 

## 2015-06-12 LAB — BENZODIAZEPINES,MS,WB/SP RFX
7-AMINOCLONAZEPAM: NEGATIVE ng/mL
Alprazolam: NEGATIVE ng/mL
Benzodiazepines Confirm: POSITIVE
CLONAZEPAM: NEGATIVE ng/mL
Chlordiazepoxide: NEGATIVE ng/mL
DESMETHYLDIAZEPAM: 23 ng/mL
DIAZEPAM: NEGATIVE ng/mL
Desalkylflurazepam: NEGATIVE ng/mL
Desmethylchlordiazepoxide: NEGATIVE ng/mL
Flurazepam: NEGATIVE ng/mL
Lorazepam: NEGATIVE ng/mL
Midazolam: 4 ng/mL
OXAZEPAM: NEGATIVE ng/mL
Temazepam: NEGATIVE ng/mL
Triazolam: NEGATIVE ng/mL

## 2015-06-18 LAB — DRUG SCREEN 10 W/CONF, SERUM
AMPHETAMINES, IA: NEGATIVE ng/mL
BARBITURATES, IA: NEGATIVE ug/mL
BENZODIAZEPINES, IA: POSITIVE ng/mL
COCAINE & METABOLITE, IA: NEGATIVE ng/mL
Methadone, IA: NEGATIVE ng/mL
Opiates, IA: NEGATIVE ng/mL
Oxycodones, IA: POSITIVE ng/mL
PHENCYCLIDINE, IA: NEGATIVE ng/mL
PROPOXYPHENE, IA: NEGATIVE ng/mL
THC(Marijuana) Metabolite, IA: NEGATIVE ng/mL

## 2015-06-18 LAB — OXYCODONES,MS,WB/SP RFX
OXYCOCONE: 12.1 ng/mL
OXYCODONES CONFIRMATION: POSITIVE
Oxymorphone: NEGATIVE ng/mL

## 2015-06-19 ENCOUNTER — Telehealth: Payer: Self-pay | Admitting: Gastroenterology

## 2015-06-19 ENCOUNTER — Ambulatory Visit (HOSPITAL_COMMUNITY)
Admission: RE | Admit: 2015-06-19 | Discharge: 2015-06-19 | Disposition: A | Discharge status: Home / Self Care | Payer: Medicare Other | Source: Ambulatory Visit | Attending: Gastroenterology | Admitting: Gastroenterology

## 2015-06-19 ENCOUNTER — Other Ambulatory Visit: Payer: Self-pay

## 2015-06-19 DIAGNOSIS — R188 Other ascites: Secondary | ICD-10-CM

## 2015-06-19 DIAGNOSIS — K7460 Unspecified cirrhosis of liver: Secondary | ICD-10-CM | POA: Insufficient documentation

## 2015-06-19 LAB — BODY FLUID CELL COUNT WITH DIFFERENTIAL
Eos, Fluid: 0 %
LYMPHS FL: 76 %
Monocyte-Macrophage-Serous Fluid: 18 % — ABNORMAL LOW (ref 50–90)
NEUTROPHIL FLUID: 6 % (ref 0–25)
Total Nucleated Cell Count, Fluid: 114 cu mm (ref 0–1000)

## 2015-06-19 MED ORDER — ALBUMIN HUMAN 25 % IV SOLN
25.0000 g | Freq: Once | INTRAVENOUS | Status: AC
  Administered 2015-06-19: 25 g via INTRAVENOUS

## 2015-06-19 MED ORDER — ALBUMIN HUMAN 25 % IV SOLN
INTRAVENOUS | Status: AC
  Administered 2015-06-19: 25 g via INTRAVENOUS
  Filled 2015-06-19: qty 50

## 2015-06-19 NOTE — Telephone Encounter (Signed)
Orders are in and she is aware to go on the the hospital for the [place] now per Rich.

## 2015-06-19 NOTE — Telephone Encounter (Signed)
Pt is out of standing orders for [place] Para. Please advise since [facility] is out of the office today

## 2015-06-19 NOTE — Procedures (Signed)
PreOperative Dx: Cirrhosis, ascites Postoperative Dx: Cirrhosis, ascites Procedure:   US guided paracentesis Radiologist:  Thornton Papas Anesthesia:  10 ml of 1% lidocaine Specimen:  6500 ml of yellow ascitic fluid EBL:   < 1 ml Complications: None

## 2015-06-19 NOTE — Telephone Encounter (Signed)
OK to schedule LVAP. Give IV albumin 25 grams at onset of LVAP and repeat after 4Liters removed.  May have LVAP every two weeks as needed.  Standing order for six months.  Send fluid for cell count/diff, culture.

## 2015-06-19 NOTE — Progress Notes (Signed)
Paracentesis complete no signs of distress. 6500 ml yellow colored ascites removed.

## 2015-06-19 NOTE — Telephone Encounter (Signed)
Patient's boyfriend called to see if we could get patient scheduled for a para today. Please advise and call [phone]

## 2015-06-20 LAB — PATHOLOGIST SMEAR REVIEW

## 2015-06-20 NOTE — Telephone Encounter (Signed)
REVIEWED. AGREE. NO ADDITIONAL RECOMMENDATIONS. 

## 2015-06-24 LAB — CULTURE, BODY FLUID W GRAM STAIN -BOTTLE: Culture: NO GROWTH

## 2015-06-24 LAB — CULTURE, BODY FLUID-BOTTLE

## 2015-06-27 ENCOUNTER — Ambulatory Visit (HOSPITAL_COMMUNITY)
Admission: RE | Admit: 2015-06-27 | Discharge: 2015-06-27 | Disposition: A | Discharge status: Home / Self Care | Payer: Medicare Other | Source: Ambulatory Visit | Attending: Gastroenterology | Admitting: Gastroenterology

## 2015-06-27 DIAGNOSIS — K7460 Unspecified cirrhosis of liver: Secondary | ICD-10-CM | POA: Diagnosis not present

## 2015-06-27 DIAGNOSIS — K7581 Nonalcoholic steatohepatitis (NASH): Secondary | ICD-10-CM | POA: Diagnosis not present

## 2015-06-27 DIAGNOSIS — R188 Other ascites: Secondary | ICD-10-CM | POA: Insufficient documentation

## 2015-06-27 LAB — BODY FLUID CELL COUNT WITH DIFFERENTIAL
Eos, Fluid: 0 %
LYMPHS FL: 71 %
Monocyte-Macrophage-Serous Fluid: 23 % — ABNORMAL LOW (ref 50–90)
NEUTROPHIL FLUID: 6 % (ref 0–25)
Total Nucleated Cell Count, Fluid: 239 cu mm (ref 0–1000)

## 2015-06-27 LAB — GRAM STAIN

## 2015-06-27 MED ORDER — ALBUMIN HUMAN 25 % IV SOLN
INTRAVENOUS | Status: AC
  Administered 2015-06-27: 25 g
  Filled 2015-06-27: qty 100

## 2015-06-27 NOTE — Procedures (Signed)
PreOperative Dx: Cirrhosis due to NASH, ascites Postoperative Dx: Cirrhosis due to NASH, ascites Procedure:   US guided paracentesis Radiologist:  Thornton Papas Anesthesia:  10 ml of 1% lidocaine Specimen:  4100 ml of yellow ascitic fluid EBL:   < 1 ml Complications: None

## 2015-06-27 NOTE — Sedation Documentation (Signed)
Paracentesis completed. No signs of distress noted. 4100 mL yellow colored ascites removed.

## 2015-06-27 NOTE — Discharge Instructions (Signed)
Paracentesis Paracentesis is a procedure to remove excess fluid (ascites) from the belly (abdomen). Ascites can result from certain conditions, such as infection, inflammation, abdominal injury, heart failure, chronic scarring of the liver (cirrhosis), or cancer. Ascites is removed using a needle that is inserted through the skin and tissue into the abdomen. This procedure may be done:  To determine the cause of the ascites.  To relieve symptoms that are caused by the ascites, such as pain or shortness of breath.  To see if there is bleeding after an abdominal injury. LET [facility] CARE PROVIDER KNOW ABOUT:  Any allergies you have.  All medicines you are taking, including vitamins, herbs, eye drops, creams, and over-the-counter medicines.  Previous problems you or members of your family have had with the use of anesthetics.  Any blood disorders you have.  Previous surgeries you have had.  Any medical conditions you have.  Whether you are pregnant or may be pregnant. RISKS AND COMPLICATIONS Generally, this is a safe procedure. However, problems may occur, including:  Infection.  Bleeding.  Injury to an abdominal organ, such as the bowel (large intestine), liver, spleen, or bladder.  Low blood pressure (hypotension).  Spreading of cancer, if there are cancer cells in the abdominal fluid.  Mental status changes in people who have liver disease. These changes would be caused by shifts in the balance of fluids and minerals (electrolytes) in the body. BEFORE THE PROCEDURE  Ask your health care provider about:  Changing or stopping your regular medicines. This is especially important if you are taking diabetes medicines or blood thinners.  Taking medicines such as aspirin and ibuprofen. These medicines can thin your blood. Do not take these medicines before your procedure if your health care provider instructs you not to.  A blood sample may be done to determine your blood  clotting time.  You will be asked to urinate. PROCEDURE  You may be asked to lie on your back with your head raised (elevated).  To reduce your risk of infection:  Your health care team will wash or sanitize their hands.  Your skin will be washed with soap.  You will be given a medicine to numb the area (local anesthetic).  Your abdominal skin will be punctured with a needle or a scalpel.  A drainage tube will be inserted through the puncture site. Fluid will drain through the tube into a container.  After enough fluid has been removed, the tube will be removed.  A sample of the fluid will be sent for examination.  A bandage (dressing) will be placed over the puncture site. The procedure may vary among health care providers and hospitals. AFTER THE PROCEDURE  It is your responsibility to get your test results. Ask your health care provider or the department performing the test when your results will be ready.   This information is not intended to replace advice given to you by your health care provider. Make sure you discuss any questions you have with your health care provider.   Document Released: 02/09/2005 Document Revised: 04/17/2015 Document Reviewed: 10/09/2014 Elsevier Interactive Patient Education 2016 [place].  Paracentesis, Care After Refer to this sheet in the next few weeks. These instructions provide you with information about caring for yourself after your procedure. Your health care provider may also give you more specific instructions. Your treatment has been planned according to current medical practices, but problems sometimes occur. Call your health care provider if you have any problems or questions  after your procedure. WHAT TO EXPECT AFTER THE PROCEDURE After your procedure, it is common to have a small amount of clear fluid coming from the puncture site. HOME CARE INSTRUCTIONS  Return to your normal activities as told by your health care provider.  Ask your health care provider what activities are safe for you.  Take over-the-counter and prescription medicines only as told by your health care provider.  Do not take baths, swim, or use a hot tub until your health care provider approves.  Follow instructions from your health care provider about:  How to take care of your puncture site.  When and how you should change your bandage (dressing).  When you should remove your dressing.  Check your puncture area every day signs of infection. Watch for:  Redness, swelling, or pain.  Fluid, blood, or pus.  Keep all follow-up visits as told by your health care provider. This is important. SEEK MEDICAL CARE IF:  You have redness, swelling, or pain at your puncture site.  You start to have more clear fluid coming from your puncture site.  You have blood or pus coming from your puncture site.  You have chills.  You have a fever. SEEK IMMEDIATE MEDICAL CARE IF:  You develop chest pain or shortness of breath.  You develop increasing pain, discomfort, or swelling in your abdomen.  You feel dizzy or light-headed or you pass out.   This information is not intended to replace advice given to you by your health care provider. Make sure you discuss any questions you have with your health care provider.   Document Released: 12/11/2014 Document Reviewed: 12/11/2014 Elsevier Interactive Patient Education [employer].

## 2015-06-28 LAB — PATHOLOGIST SMEAR REVIEW

## 2015-07-02 LAB — CULTURE, BODY FLUID W GRAM STAIN -BOTTLE: Culture: NO GROWTH

## 2015-07-02 LAB — CULTURE, BODY FLUID-BOTTLE

## 2015-07-02 IMAGING — US US PARACENTESIS
1 series · 5 of 5 positions shown · non-contrast
Comparison: 10/31/2014

CLINICAL DATA: Cirrhosis, ascites

EXAM:
ULTRASOUND GUIDED THERAPEUTIC PARACENTESIS

[Series 1: us paracentesis · 0.21mm/px · 5 of 5 slices shown]
[im 1/5]
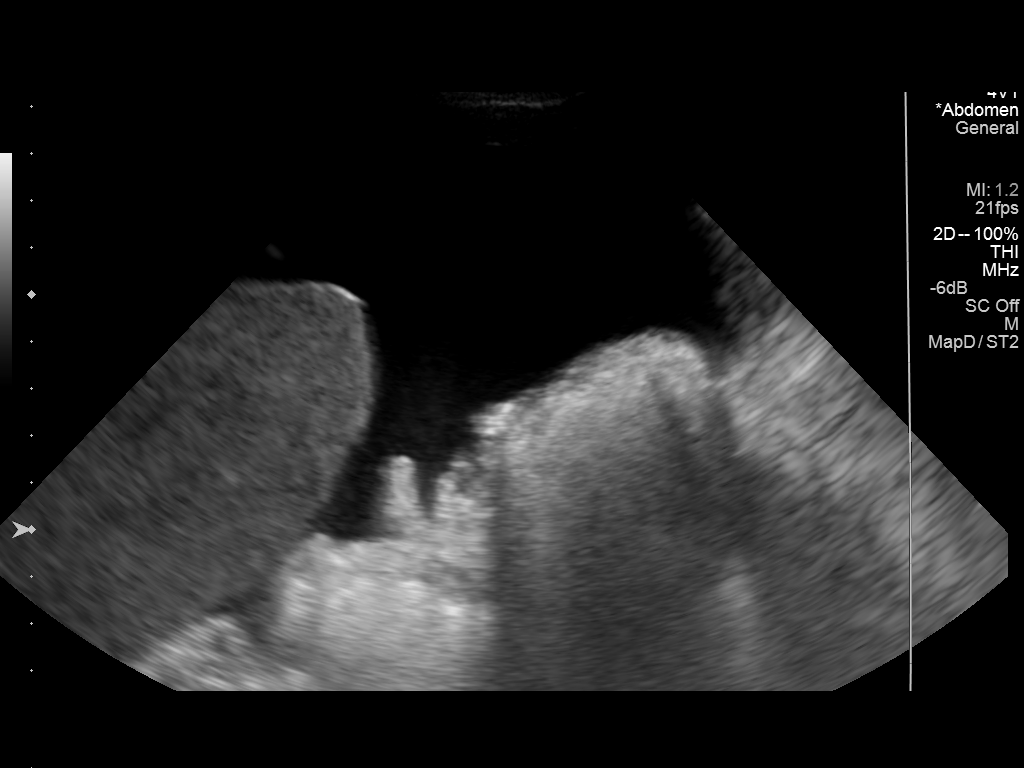
[im 2/5]
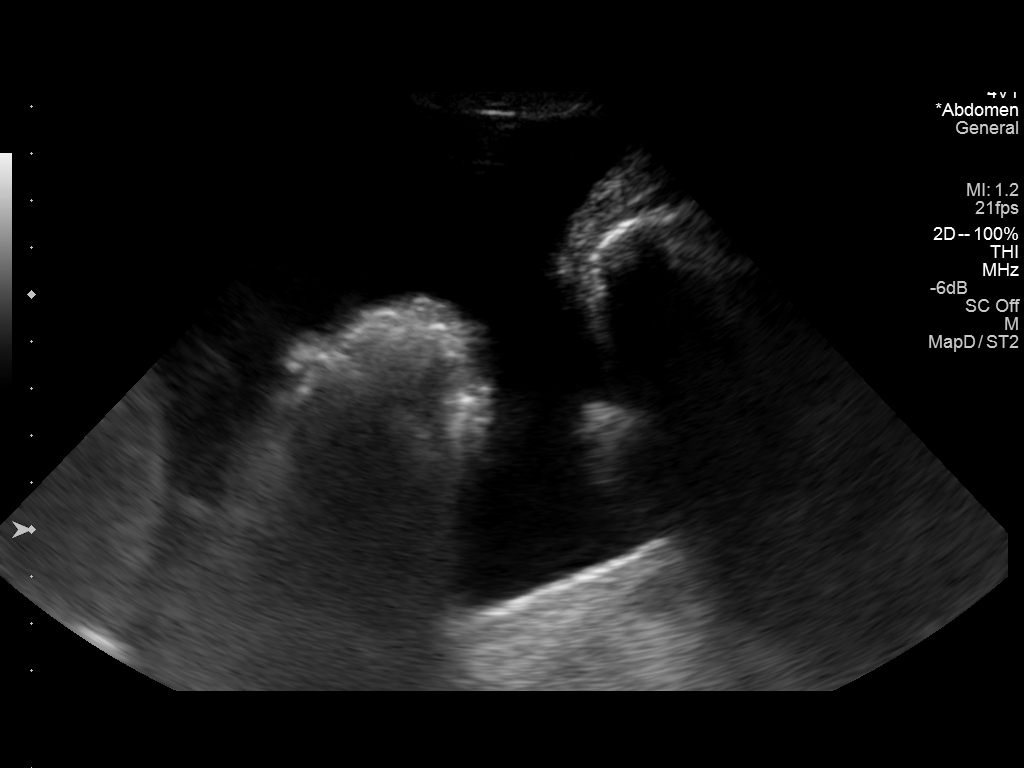
[im 3/5]
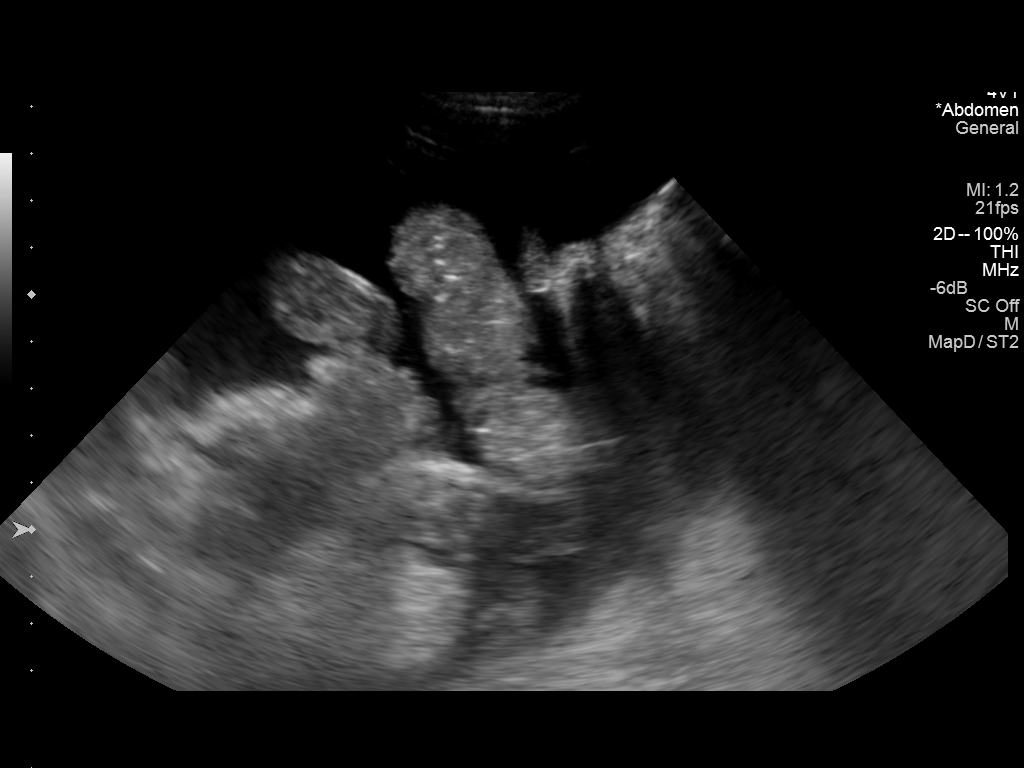
[im 4/5]
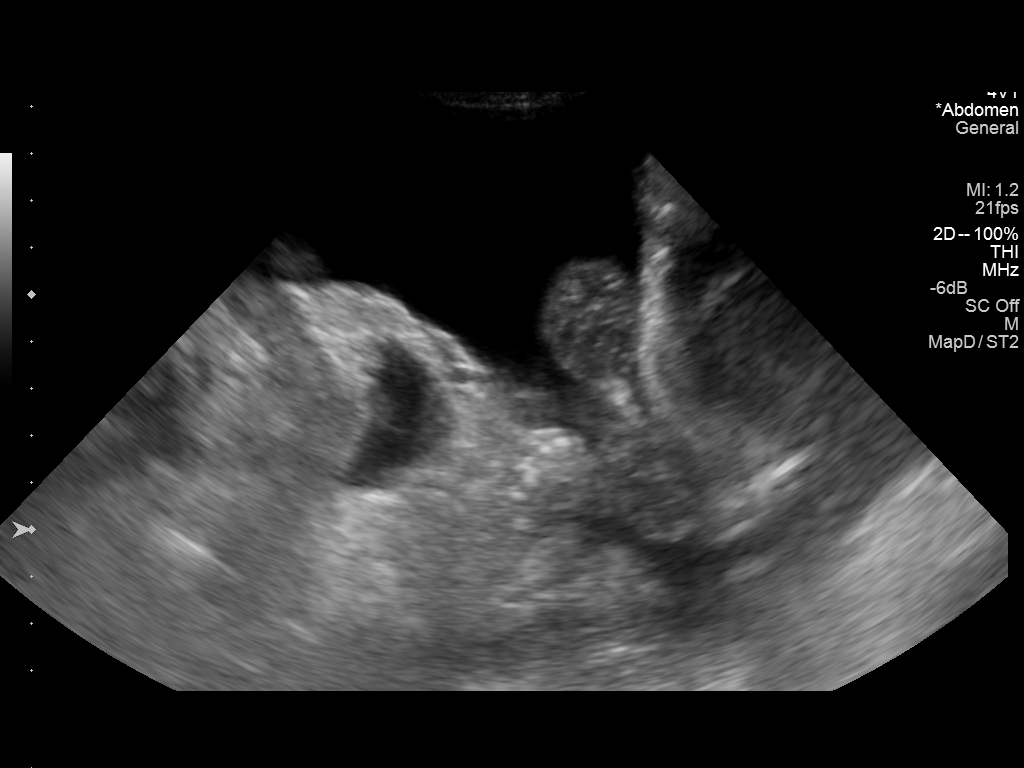
[im 5/5]
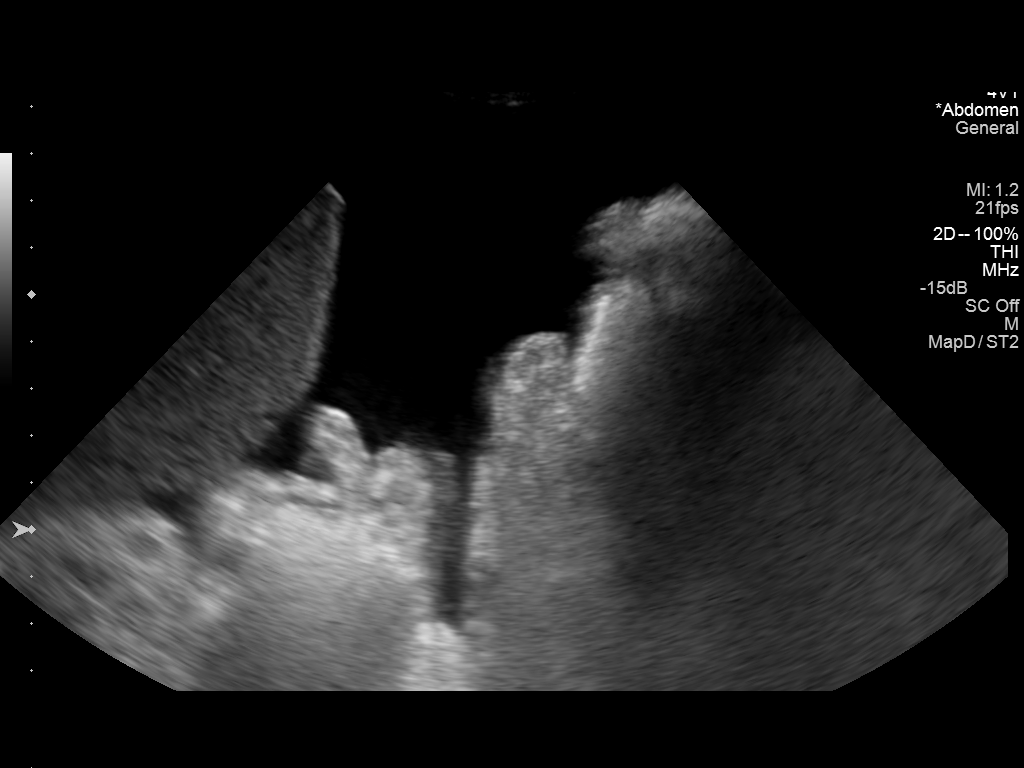

[5 of 5 positions shown; findings below may reference images not displayed]

PROCEDURE:
Procedure, benefits, and risks of procedure were discussed with
patient.

Written informed consent for procedure was obtained.

Time out protocol followed.

Adequate collection of ascites localized by ultrasound in RIGHT
lower quadrant.

Skin prepped and draped in usual sterile fashion.

Skin and soft tissues anesthetized with 10 mL of 1% lidocaine.

5 French Yueh catheter placed into peritoneal cavity.

3400 mL of amber colored ascites aspirated by vacuum bottle suction.

Procedure tolerated well by patient without immediate complication.
FINDINGS: As above
IMPRESSION: Successful ultrasound guided paracentesis yielding 3400 mL of
ascites.

## 2015-07-10 ENCOUNTER — Encounter: Payer: Self-pay | Admitting: Gastroenterology

## 2015-07-10 ENCOUNTER — Other Ambulatory Visit: Payer: Self-pay

## 2015-07-10 ENCOUNTER — Ambulatory Visit (INDEPENDENT_AMBULATORY_CARE_PROVIDER_SITE_OTHER): Payer: Medicare Other | Admitting: Gastroenterology

## 2015-07-10 VITALS — BP 106/62 | HR 98 | Temp 97.9°F | Ht 62.0 in | Wt 119.2 lb

## 2015-07-10 DIAGNOSIS — K7460 Unspecified cirrhosis of liver: Secondary | ICD-10-CM

## 2015-07-10 DIAGNOSIS — K7581 Nonalcoholic steatohepatitis (NASH): Secondary | ICD-10-CM

## 2015-07-10 DIAGNOSIS — K7682 Hepatic encephalopathy: Secondary | ICD-10-CM

## 2015-07-10 DIAGNOSIS — K3189 Other diseases of stomach and duodenum: Secondary | ICD-10-CM | POA: Diagnosis not present

## 2015-07-10 DIAGNOSIS — I8510 Secondary esophageal varices without bleeding: Secondary | ICD-10-CM | POA: Diagnosis not present

## 2015-07-10 DIAGNOSIS — I8500 Esophageal varices without bleeding: Secondary | ICD-10-CM

## 2015-07-10 DIAGNOSIS — K766 Portal hypertension: Secondary | ICD-10-CM

## 2015-07-10 DIAGNOSIS — K7290 Hepatic failure, unspecified without coma: Secondary | ICD-10-CM | POA: Diagnosis not present

## 2015-07-10 MED ORDER — SUCRALFATE 1 GM/10ML PO SUSP: 1.0000 g | Freq: Four times a day (QID) | ORAL | Status: DC

## 2015-07-10 MED ORDER — LORAZEPAM 1 MG PO TABS: 0.5000 mg | ORAL_TABLET | Freq: Two times a day (BID) | ORAL | Status: DC | PRN

## 2015-07-10 NOTE — Patient Instructions (Addendum)
COMPLETE PARACENTESIS NEXT WED.  EGD FOR VARICEAL BANDING IN DEC 2016.  CONTINUE LACTULOSE. GO DOWN ON DOSE TO PREVENT DIARRHEA.  WE WILL SEE IF WE CAN GET THE XIFAXAN.  USE ATIVAN AND OXYCODONE ON IF YOU REALLY NEED IT. IT CAN MAKE YOUR AMMONIA LEVELS WORSE.  USE CARAFATE AS NEEDED.  FOLLOW UP IN 6 MOS.

## 2015-07-10 NOTE — Assessment & Plan Note (Signed)
PARTIALLY COMPENSATED DISEASE.  COMPLETE PARACENTESIS NEXT WED. EGD FOR VARICEAL BANDING IN DEC 2016. CONTINUE LACTULOSE. GO DOWN ON DOSE TO PREVENT DIARRHEA. WE WILL SEE IF WE CAN GET THE XIFAXAN. USE ATIVAN AND OXYCODONE ON IF YOU REALLY NEED IT.  USE CARAFTE AS NEEDED. FOLLOW UP IN 6 MOS.

## 2015-07-10 NOTE — Assessment & Plan Note (Addendum)
SYMPTOMS FAIRLY WELL CONTROLLED.  CAN'T AFFORD XIFAXAN. WILL SEE IF PA WILL BRING DOWN COST. CONTINUE LACTULOSE. GO DOWN ON DOSE TO PREVENT DIARRHEA. USE ATIVAN AND OXYCODONE ON IF YOU REALLY NEED IT.  FOLLOW UP IN 6 MOS.   GREATER THAN 50% WAS SPENT IN COUNSELING & COORDINATION OF CARE WITH THE PATIENT: DISCUSSED DIFFERENTIAL DIAGNOSIS, PROCEDURE, BENEFITS, RISKS, AND MANAGEMENT OF CIRRHOSIS/ASCITES/ENCEPHALOPATHY/VARICES. TOTAL ENCOUNTER TIME: 38 MINS.

## 2015-07-10 NOTE — Assessment & Plan Note (Addendum)
LAST UGIB DUE TO VARICES OCT 2016.  EGD FOR VARICEAL BANDING IN DEC 2016. PHENERGAN 12.5 MG  AND CIPRO 400 MG IV IN PREOP. DISCUSSED PROCEDURE, BENEFITS, & RISKS: < 1% chance of medication reaction, OR bleeding. PT NOT A GOOD CANDIDATE FOR TIPS BECAUSE SHE HAS ENCEPHALOPATHY AND CAN'T AFFORD XIFAXAN. FOLLOW UP IN 6 MOS.

## 2015-07-10 NOTE — Progress Notes (Signed)
Subjective:    Patient ID: Wanda [name], female    DOB: 1975/08/29, 39 y.o.   MRN: [id number]  Wanda [name]  HPI Pt last admitted AFTER [facility]NC[facility] AT [facility] 2016 FOR UGIB DUE TO VARICEAL BLEED. HAD 4u pRBCS AND EGD/EVL WITH 4 BANDS PLACED. D/C HOM ON LACTULOSE AND XIFAXAN BUT CAN'T AFFORD XIFAXAN. CAN TOLERATE CARAFATE LIQUID BUT PILLS MAKE HER NAUSEATED. NAUSEA: JUST WITH ACIDIC THINGS. BMs: LOOSE RIGHT NOW. HAS OCCASIONAL PRESSURE IN CHEST. BETTER WITH ICE & TIME. HAD HAD A COUGH LATELY. C/O SORENESS IN UPPER RIGHT AND LEFT FLANK. HAS LOWER ABDOMINAL PAIN.  PT DENIES FEVER, CHILLS, HEMATOCHEZIA, HEMATEMESIS,  vomiting, melena, diarrhea, SHORTNESS OF BREATH, CHANGE IN BOWEL IN HABITS, constipation, OR problems swallowing.  Past Medical History  Diagnosis Date  . GERD (gastroesophageal reflux disease)   . Cirrhosis ([place]) 10/05/13    Liver bx 11/23/13 (delayed initially due to patient refusal). c/w steatohepatitis  . Folate deficiency 09/2013  . Anasarca 10/10/2013  . Hematemesis/vomiting blood 02/24/2014  . Acute blood loss anemia 02/25/2014    Status post transfusion  . Macrocytosis 02/28/2014  . Bleeding esophageal varices ([place]) 02/28/2014    s/p banding  . Acute renal failure ([place]) 09/2013    Pre-renal- resolved  . C. difficile colitis 04/19/2014  . Anxiety   . Depression   . Thrombocytopenia ([place])     Hypercellular bone marrow; abundant megakaryocytes per 08/27/2014; s/p bone marrow bx  . Cirrhosis of liver with ascites ([place])   . SBP (spontaneous bacterial peritonitis) ([place]) 11/10/2013  . Bipolar disorder ([place]) 12/04/2013    2007-SEEN IN ED FOR INVOLUNTARY COMMITMENT, UDS POS FOR AMPHETAMINES/OPIATES   . PNA (pneumonia) 10/13/2013  . Chronic hypotension   . Gastroesophageal junction ulcer 09/17/2014  . ESRD (end stage renal disease) on dialysis ([place]) 08/2014    Past Surgical History  Procedure Laterality Date  . None    . Paracentesis  Feb 2015    1180 fluid,  negative fluid analysis.   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 Paracentesis  10/2013  . Colonoscopy N/A 12/19/2013    SLF:NO OBVIOUS SOURCE FOR ANEMIA IDETIFIED/ONE COLON POLYP REMOVED/Small internal hemorrhoids  . Esophagogastroduodenoscopy N/A 02/11/2014    Dr. Rourk:Esophageal varices with bleeding stigmata-status post esophageal band ligation therapy. Portal gastropathy  . Esophagogastroduodenoscopy N/A 07/04/2014    RMR: Persiting grade 2 esophageal varicies with bleeding stigmata status post band ligation. Significantly congested gastric mucosa iwith changes constistant with protal gastropathy.   . Esophageal banding  07/04/2014    Procedure: ESOPHAGEAL BANDING;  Surgeon: Daneil Dolin, MD;  Location: [facility] END[facility] SUITE;  Service: Endoscopy;;  . Esophagogastroduodenoscopy (egd) with propofol N/A 07/24/2014    SLF:  1. 2 columns grade 2-3 varices- 2 Bands applied.  2.  Moderate gastropathy 3. Duodenal Diverticula  . Esophageal banding N/A 07/24/2014    Procedure: ESOPHAGEAL BANDING (2 bands applied);  Surgeon: Danie Binder, MD;  Location: [facility] ORS;  Service: Endoscopy;  Laterality: N/A;  . Central venous catheter insertion Right   . Esophagogastroduodenoscopy N/A 09/16/2014    Rehman: Single short column of varix proximal to GE junction not large enough to be banded. Two amall ulcers at the GEJ felt to be source of GI Bleeding but no active bleeding but no actibe bleeding noted. No therapy rendered. Portal gastropathy NO evidence of peptic ulcer  diease or gastric varices.   . Esophagogastroduodenoscopy (egd) with propofol N/A 10/26/2014    RMR: 2 columns of grade 2 esophageal varices without obvious bleeding stigmata status post band ligation to complet obliteration of remaining varices.   . Av fistula placement Right 11/16/2014    Procedure: Right arm Creation  of arteriovenous fistula;  Surgeon: Angelia Mould, MD;  Location: [facility] OR;  Service: Vascular;  Laterality: Right;  . Esophagogastroduodenoscopy N/A 12/14/2014    SLF: Grade ! esophageal varices. 2. Moderate Portal Gastropathy  . Esophagogastroduodenoscopy N/A 03/27/2015    Procedure: ESOPHAGOGASTRODUODENOSCOPY (EGD);  Surgeon: Danie Binder, MD;  Location: [facility];  Service: Endoscopy;  Laterality: N/A;  1045am - moved to 817 @ 11:30  . Esophagogastroduodenoscopy N/A 06/05/2015    Procedure: ESOPHAGOGASTRODUODENOSCOPY (EGD);  Surgeon: Clarene Essex, MD;  [facility] ENDOSCOPY;  Service: Endoscopy;  Laterality: N/A;    Allergies  Allergen Reactions  . Lasix [Furosemide]     "doesn't work"  . Latex Itching  . Morphine And Related Hives and Itching    Current Outpatient Prescriptions  Medication Sig Dispense Refill  . calcium acetate (PHOSLO) 667 MG capsule Take 1 capsule (667 mg total) by mouth 3 (three) times daily with meals.    . Darbepoetin Alfa (ARANESP) 60 MCG/0.3ML SOSY injection Inject 0.3 mLs (60 mcg total) into the vein every Thursday with hemodialysis.    [name] lactulose (CHRONULAC) 10 GM/15ML solution Take 15 mLs (10 g total) by mouth 2 (two) times daily. (Patient taking differently: Take 10 g by mouth daily as needed for mild constipation or moderate constipation. )    . lidocaine (XYLOCAINE) 2 % solution TAKE TWO TEASPOONSFUL (10ML) BY MOUTH BEFORE MEALS AND AT BEDTIME TO PREVENT CHEST PAIN WHILE EATING    . lidocaine-prilocaine (EMLA) cream Apply 1 application topically See admin instructions. Applied prior to port access    . midodrine (PROAMATINE) 10 MG tablet Take 1 tablet (10 mg total) by mouth daily. *Takes only on dialysis days on Tuesdays, Thursdays, and Saturdays (Sometimes takes treatments on Friday instead of Saturday)**Medication to keep your blood pressure from falling.    . multivitamin (RENA-VIT) TABS tablet Take 1 tablet by mouth at bedtime.    . Oxycodone  HCl 10 MG TABS 1 PO TID AS NEEDED FOR PAIN (Patient taking differently: Take 10 mg by mouth 3 (three) times daily. )    . pantoprazole (PROTONIX) 40 MG tablet Take 1 tablet (40 mg total) by mouth 2 (two) times daily. (Patient taking differently: Take 40 mg by mouth daily as needed (for acid reflux/GERD). )    . sucralfate (CARAFATE) 1 G tablet Take 1 tablet (1 g total) by mouth 4 (four) times daily -  with meals and at bedtime.    .       Review of Systems PER HPI OTHERWISE ALL SYSTEMS ARE NEGATIVE.    Objective:   Physical Exam  Constitutional: She is oriented to person, place, and time. She appears well-developed and well-nourished. No distress.  HENT:  Head: Normocephalic and atraumatic.  Mouth/Throat: Oropharynx is clear and moist. No oropharyngeal exudate.  Eyes: Pupils are equal, round, and reactive to light. No scleral icterus.  Neck: Normal range of motion. Neck supple.  Cardiovascular: Normal rate and regular rhythm.   Murmur heard. Pulmonary/Chest: Effort normal and breath sounds normal. No respiratory distress.  Abdominal: Soft. Bowel sounds are normal. Distention: MODERATE. There is tenderness (MILD PERIUMBILICAL).  Musculoskeletal: Edema: BIL LEs.  Lymphadenopathy:  She has no cervical adenopathy.  Neurological: She is alert and oriented to person, place, and time.  NO  NEW FOCAL DEFICITS, INTENTION TREMOR IN HANDS AND LEGS  Psychiatric:  FLAT AFFECT, NL MOOD  Vitals reviewed.     Assessment & Plan:

## 2015-07-11 NOTE — Progress Notes (Signed)
CC'D TO PCP °

## 2015-07-11 NOTE — Progress Notes (Signed)
ON RECALL  °

## 2015-07-16 ENCOUNTER — Telehealth: Payer: Self-pay | Admitting: Gastroenterology

## 2015-07-16 NOTE — Telephone Encounter (Signed)
REVIEWED. AGREE. NO ADDITIONAL RECOMMENDATIONS. 

## 2015-07-16 NOTE — Telephone Encounter (Signed)
Pt called this afternoon to say that she is scheduled for a para at the hospital tomorrow and wanted to know if she could go today. Please advise and call her at [phone]

## 2015-07-16 NOTE — Telephone Encounter (Signed)
Called [place] and they don't have anything for today. She will just go in the morning as schedule

## 2015-07-17 ENCOUNTER — Ambulatory Visit (HOSPITAL_COMMUNITY)
Admission: RE | Admit: 2015-07-17 | Discharge: 2015-07-17 | Disposition: A | Discharge status: Home / Self Care | Payer: Medicare Other | Source: Ambulatory Visit | Attending: Gastroenterology | Admitting: Gastroenterology

## 2015-07-17 ENCOUNTER — Encounter (HOSPITAL_COMMUNITY): Payer: Self-pay

## 2015-07-17 DIAGNOSIS — R188 Other ascites: Secondary | ICD-10-CM | POA: Insufficient documentation

## 2015-07-17 LAB — BODY FLUID CELL COUNT WITH DIFFERENTIAL
EOS FL: 0 %
LYMPHS FL: 35 %
MONOCYTE-MACROPHAGE-SEROUS FLUID: 58 % (ref 50–90)
NEUTROPHIL FLUID: 7 % (ref 0–25)
Total Nucleated Cell Count, Fluid: 238 cu mm (ref 0–1000)

## 2015-07-17 LAB — GRAM STAIN

## 2015-07-17 MED ORDER — ALBUMIN HUMAN 25 % IV SOLN
INTRAVENOUS | Status: AC
  Administered 2015-07-17: 25 g
  Filled 2015-07-17: qty 100

## 2015-07-17 NOTE — Progress Notes (Signed)
Paracentesis complete no signs of distress. 5200 ml dark yellow colored ascites removed.

## 2015-07-18 LAB — PATHOLOGIST SMEAR REVIEW

## 2015-07-21 IMAGING — US US PARACENTESIS
1 series · 3 of 3 positions shown · non-contrast
Comparison: None.

CLINICAL DATA: Ascites.  Cirrhosis.

EXAM:
ULTRASOUND GUIDED PARACENTESIS

[Series 1: us paracentesis · 0.21mm/px · 3 of 3 slices shown]
[im 1/3]
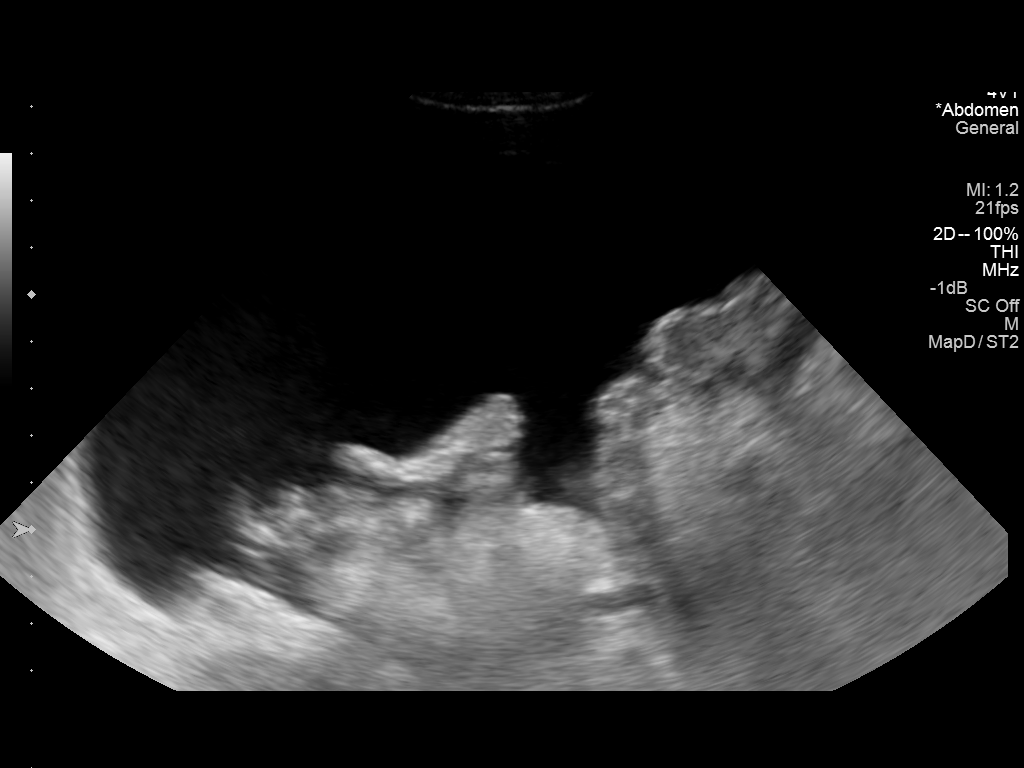
[im 2/3]
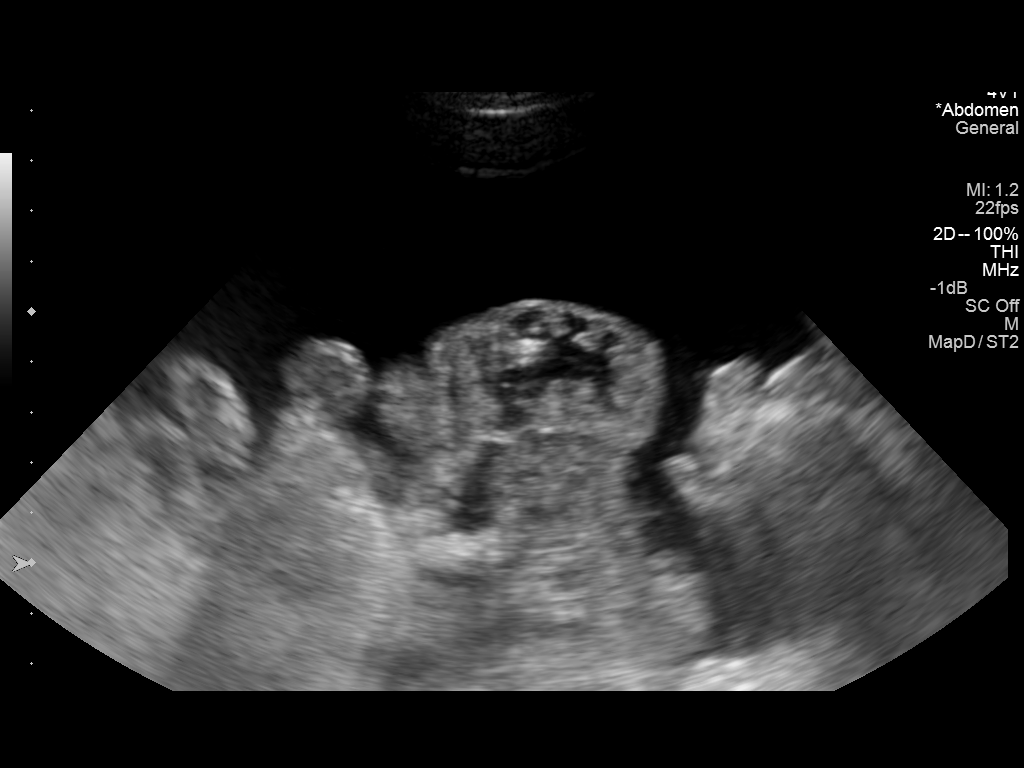
[im 3/3]
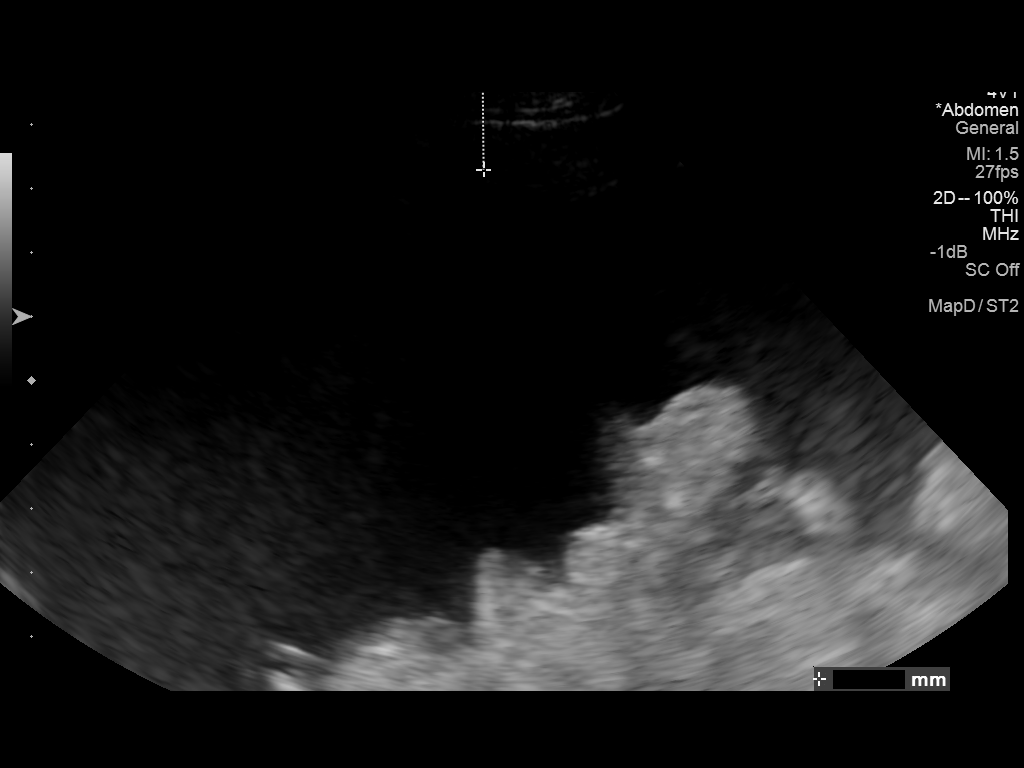

[3 of 3 positions shown; findings below may reference images not displayed]

PROCEDURE:
An ultrasound guided paracentesis was thoroughly discussed with the
patient and questions answered. The benefits, risks, alternatives
and complications were also discussed. The patient understands and
wishes to proceed with the procedure. Written consent was obtained.

Ultrasound was performed to localize and mark an adequate pocket of
fluid in the right mid abdomen. The area was then prepped and draped
in the normal sterile fashion. 1% Lidocaine was used for local
anesthesia. Under ultrasound guidance a 19 gauge Yueh catheter was
introduced. Paracentesis was performed. The catheter was removed and
a dressing applied.

COMPLICATIONS:
None.
FINDINGS: A total of approximately 3366 cc of yellow fluid was removed. A
fluid sample not sent for laboratory analysis.
IMPRESSION: Successful ultrasound guided paracentesis yielding 3366 cc of
ascites.

## 2015-07-22 ENCOUNTER — Other Ambulatory Visit: Payer: Self-pay | Admitting: Gastroenterology

## 2015-07-22 LAB — CULTURE, BODY FLUID W GRAM STAIN -BOTTLE: Culture: NO GROWTH

## 2015-07-22 LAB — CULTURE, BODY FLUID-BOTTLE

## 2015-07-23 ENCOUNTER — Encounter (HOSPITAL_COMMUNITY)
Admission: RE | Disposition: A | Discharge status: Home / Self Care | Payer: Self-pay | Source: Ambulatory Visit | Attending: Gastroenterology

## 2015-07-23 ENCOUNTER — Ambulatory Visit (HOSPITAL_COMMUNITY)
Admission: RE | Admit: 2015-07-23 | Discharge: 2015-07-23 | Disposition: A | Discharge status: Home / Self Care | Payer: Medicare Other | Source: Ambulatory Visit | Attending: Gastroenterology | Admitting: Gastroenterology

## 2015-07-23 DIAGNOSIS — K219 Gastro-esophageal reflux disease without esophagitis: Secondary | ICD-10-CM | POA: Diagnosis not present

## 2015-07-23 DIAGNOSIS — Z992 Dependence on renal dialysis: Secondary | ICD-10-CM | POA: Insufficient documentation

## 2015-07-23 DIAGNOSIS — K2970 Gastritis, unspecified, without bleeding: Secondary | ICD-10-CM | POA: Insufficient documentation

## 2015-07-23 DIAGNOSIS — K766 Portal hypertension: Secondary | ICD-10-CM | POA: Diagnosis not present

## 2015-07-23 DIAGNOSIS — Z79899 Other long term (current) drug therapy: Secondary | ICD-10-CM | POA: Insufficient documentation

## 2015-07-23 DIAGNOSIS — K3189 Other diseases of stomach and duodenum: Secondary | ICD-10-CM | POA: Insufficient documentation

## 2015-07-23 DIAGNOSIS — R1030 Lower abdominal pain, unspecified: Secondary | ICD-10-CM | POA: Diagnosis not present

## 2015-07-23 DIAGNOSIS — N186 End stage renal disease: Secondary | ICD-10-CM | POA: Diagnosis not present

## 2015-07-23 DIAGNOSIS — F418 Other specified anxiety disorders: Secondary | ICD-10-CM | POA: Insufficient documentation

## 2015-07-23 DIAGNOSIS — I8500 Esophageal varices without bleeding: Secondary | ICD-10-CM | POA: Diagnosis not present

## 2015-07-23 DIAGNOSIS — K7460 Unspecified cirrhosis of liver: Secondary | ICD-10-CM | POA: Insufficient documentation

## 2015-07-23 HISTORY — PX: ESOPHAGOGASTRODUODENOSCOPY: SHX5428

## 2015-07-23 HISTORY — PX: ESOPHAGEAL BANDING: SHX5518

## 2015-07-23 SURGERY — EGD (ESOPHAGOGASTRODUODENOSCOPY)
Anesthesia: Moderate Sedation

## 2015-07-23 MED ORDER — LIDOCAINE VISCOUS 2 % MT SOLN
OROMUCOSAL | Status: AC
  Filled 2015-07-23: qty 15

## 2015-07-23 MED ORDER — LIDOCAINE VISCOUS 2 % MT SOLN
OROMUCOSAL | Status: DC | PRN
  Administered 2015-07-23: 1 via OROMUCOSAL

## 2015-07-23 MED ORDER — MEPERIDINE HCL 100 MG/ML IJ SOLN
INTRAMUSCULAR | Status: AC
  Filled 2015-07-23: qty 2

## 2015-07-23 MED ORDER — PROMETHAZINE HCL 25 MG/ML IJ SOLN
INTRAMUSCULAR | Status: AC
  Administered 2015-07-23: 12.5 mg via INTRAVENOUS
  Filled 2015-07-23: qty 1

## 2015-07-23 MED ORDER — MIDAZOLAM HCL 5 MG/5ML IJ SOLN
INTRAMUSCULAR | Status: DC | PRN
  Administered 2015-07-23 (×4): 2 mg via INTRAVENOUS

## 2015-07-23 MED ORDER — CIPROFLOXACIN IN D5W 400 MG/200ML IV SOLN
INTRAVENOUS | Status: AC
  Filled 2015-07-23: qty 200

## 2015-07-23 MED ORDER — CIPROFLOXACIN IN D5W 400 MG/200ML IV SOLN: 400.0000 mg | Freq: Once | INTRAVENOUS | Status: DC

## 2015-07-23 MED ORDER — PROMETHAZINE HCL 25 MG/ML IJ SOLN
12.5000 mg | Freq: Once | INTRAMUSCULAR | Status: AC
  Administered 2015-07-23: 12.5 mg via INTRAVENOUS

## 2015-07-23 MED ORDER — CIPROFLOXACIN IN D5W 400 MG/200ML IV SOLN
400.0000 mg | Freq: Once | INTRAVENOUS | Status: DC
  Administered 2015-07-23: 400 mg via INTRAVENOUS

## 2015-07-23 MED ORDER — SODIUM CHLORIDE 0.9 % IJ SOLN
INTRAMUSCULAR | Status: AC
  Filled 2015-07-23: qty 3

## 2015-07-23 MED ORDER — FENTANYL CITRATE (PF) 100 MCG/2ML IJ SOLN
INTRAMUSCULAR | Status: AC
  Filled 2015-07-23: qty 2

## 2015-07-23 MED ORDER — FENTANYL CITRATE (PF) 100 MCG/2ML IJ SOLN
INTRAMUSCULAR | Status: DC | PRN
  Administered 2015-07-23 (×4): 25 ug via INTRAVENOUS

## 2015-07-23 MED ORDER — MIDAZOLAM HCL 5 MG/5ML IJ SOLN
INTRAMUSCULAR | Status: AC
  Filled 2015-07-23: qty 10

## 2015-07-23 MED ORDER — SODIUM CHLORIDE 0.9 % IV SOLN
INTRAVENOUS | Status: DC
  Administered 2015-07-23: 08:00:00 via INTRAVENOUS

## 2015-07-23 MED ORDER — STERILE WATER FOR IRRIGATION IR SOLN
Status: DC | PRN
  Administered 2015-07-23: 08:00:00

## 2015-07-23 NOTE — Interval H&P Note (Signed)
History and Physical Interval Note:  07/23/2015 7:31 AM  Wanda [name]  has presented today for surgery, with the diagnosis of esophageal varices with banding  The various methods of treatment have been discussed with the patient and family. After consideration of risks, benefits and other options for treatment, the patient has consented to  Procedure(s) with comments: ESOPHAGOGASTRODUODENOSCOPY (EGD) (N/A) - 745 ESOPHAGEAL BANDING (N/A) as a surgical intervention .  The patient's history has been reviewed, patient examined, no change in status, stable for surgery.  I have reviewed the patient's chart and labs.  Questions were answered to the patient's satisfaction.     [employer]

## 2015-07-23 NOTE — H&P (View-Only) (Signed)
Subjective:    Patient ID: Wanda [name], female    DOB: 1976-07-15, 39 y.o.   MRN: [id number]  [name]  HPI Pt last admitted AFTER SYNCOPE AT [facility] 2016 FOR UGIB DUE TO VARICEAL BLEED. HAD 4u pRBCS AND EGD/EVL WITH 4 BANDS PLACED. D/C HOM ON LACTULOSE AND XIFAXAN BUT CAN'T AFFORD XIFAXAN. CAN TOLERATE CARAFATE LIQUID BUT PILLS MAKE HER NAUSEATED. NAUSEA: JUST WITH ACIDIC THINGS. BMs: LOOSE RIGHT NOW. HAS OCCASIONAL PRESSURE IN CHEST. BETTER WITH ICE & TIME. HAD HAD A COUGH LATELY. C/O SORENESS IN UPPER RIGHT AND LEFT FLANK. HAS LOWER ABDOMINAL PAIN.  PT DENIES FEVER, CHILLS, HEMATOCHEZIA, HEMATEMESIS,  vomiting, melena, diarrhea, SHORTNESS OF BREATH, CHANGE IN BOWEL IN HABITS, constipation, OR problems swallowing.  Past Medical History  Diagnosis Date  . GERD (gastroesophageal reflux disease)   . Cirrhosis ([place]) 10/05/13    Liver bx 11/23/13 (delayed initially due to patient refusal). c/w steatohepatitis  . Folate deficiency 09/2013  . Anasarca 10/10/2013  . Hematemesis/vomiting blood 02/24/2014  . Acute blood loss anemia 02/25/2014    Status post transfusion  . Macrocytosis 02/28/2014  . Bleeding esophageal varices ([place]) 02/28/2014    s/p banding  . Acute renal failure ([place]) 09/2013    Pre-renal- resolved  . C. difficile colitis 04/19/2014  . Anxiety   . Depression   . Thrombocytopenia ([place])     Hypercellular bone marrow; abundant megakaryocytes per 08/27/2014; s/p bone marrow bx  . Cirrhosis of liver with ascites ([place])   . SBP (spontaneous bacterial peritonitis) ([place]) 11/10/2013  . Bipolar disorder ([place]) 12/04/2013    2007-SEEN IN ED FOR INVOLUNTARY COMMITMENT, UDS POS FOR AMPHETAMINES/OPIATES   . PNA (pneumonia) 10/13/2013  . Chronic hypotension   . Gastroesophageal junction ulcer 09/17/2014  . ESRD (end stage renal disease) on dialysis ([place]) 08/2014    Past Surgical History  Procedure Laterality Date  . None    . Paracentesis  Feb 2015    1180 fluid,  negative fluid analysis.   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 Paracentesis  10/2013  . Colonoscopy N/A 12/19/2013    SLF:NO OBVIOUS SOURCE FOR ANEMIA IDETIFIED/ONE COLON POLYP REMOVED/Small internal hemorrhoids  . Esophagogastroduodenoscopy N/A 02/11/2014    Dr. Rourk:Esophageal varices with bleeding stigmata-status post esophageal band ligation therapy. Portal gastropathy  . Esophagogastroduodenoscopy N/A 07/04/2014    RMR: Persiting grade 2 esophageal varicies with bleeding stigmata status post band ligation. Significantly congested gastric mucosa iwith changes constistant with protal gastropathy.   . Esophageal banding  07/04/2014    Procedure: ESOPHAGEAL BANDING;  Surgeon: Daneil Dolin, MD;  Location: [facility];  Service: Endoscopy;;  . Esophagogastroduodenoscopy (egd) with propofol N/A 07/24/2014    SLF:  1. 2 columns grade 2-3 varices- 2 Bands applied.  2.  Moderate gastropathy 3. Duodenal Diverticula  . Esophageal banding N/A 07/24/2014    Procedure: ESOPHAGEAL BANDING (2 bands applied);  Surgeon: Danie Binder, MD;  Location: [facility] ORS;  Service: Endoscopy;  Laterality: N/A;  . Central venous catheter insertion Right   . Esophagogastroduodenoscopy N/A 09/16/2014    Rehman: Single short column of varix proximal to GE junction not large enough to be banded. Two amall ulcers at the GEJ felt to be source of GI Bleeding but no active bleeding but no actibe bleeding noted. No therapy rendered. Portal gastropathy NO evidence of peptic ulcer  diease or gastric varices.   . Esophagogastroduodenoscopy (egd) with propofol N/A 10/26/2014    RMR: 2 columns of grade 2 esophageal varices without obvious bleeding stigmata status post band ligation to complet obliteration of remaining varices.   . Av fistula placement Right 11/16/2014    Procedure: Right arm Creation  of arteriovenous fistula;  Surgeon: Angelia Mould, MD;  Location: [facility] OR;  Service: Vascular;  Laterality: Right;  . Esophagogastroduodenoscopy N/A 12/14/2014    SLF: Grade ! esophageal varices. 2. Moderate Portal Gastropathy  . Esophagogastroduodenoscopy N/A 03/27/2015    Procedure: ESOPHAGOGASTRODUODENOSCOPY (EGD);  Surgeon: Danie Binder, MD;  Location: [facility];  Service: Endoscopy;  Laterality: N/A;  1045am - moved to 817 @ 11:30  . Esophagogastroduodenoscopy N/A 06/05/2015    Procedure: ESOPHAGOGASTRODUODENOSCOPY (EGD);  Surgeon: Clarene Essex, MD;  Location: [facility] ENDOSCOPY;  Service: Endoscopy;  Laterality: N/A;    Allergies  Allergen Reactions  . Lasix [Furosemide]     "doesn't work"  . Latex Itching  . Morphine And Related Hives and Itching    Current Outpatient Prescriptions  Medication Sig Dispense Refill  . calcium acetate (PHOSLO) 667 MG capsule Take 1 capsule (667 mg total) by mouth 3 (three) times daily with meals.    . Darbepoetin Alfa (ARANESP) 60 MCG/0.3ML SOSY injection Inject 0.3 mLs (60 mcg total) into the vein every Thursday with hemodialysis.    [name] lactulose (CHRONULAC) 10 GM/15ML solution Take 15 mLs (10 g total) by mouth 2 (two) times daily. (Patient taking differently: Take 10 g by mouth daily as needed for mild constipation or moderate constipation. )    . lidocaine (XYLOCAINE) 2 % solution TAKE TWO TEASPOONSFUL (10ML) BY MOUTH BEFORE MEALS AND AT BEDTIME TO PREVENT CHEST PAIN WHILE EATING    . lidocaine-prilocaine (EMLA) cream Apply 1 application topically See admin instructions. Applied prior to port access    . midodrine (PROAMATINE) 10 MG tablet Take 1 tablet (10 mg total) by mouth daily. *Takes only on dialysis days on Tuesdays, Thursdays, and Saturdays (Sometimes takes treatments on Friday instead of Saturday)**Medication to keep your blood pressure from falling.    . multivitamin (RENA-VIT) TABS tablet Take 1 tablet by mouth at bedtime.    . Oxycodone  HCl 10 MG TABS 1 PO TID AS NEEDED FOR PAIN (Patient taking differently: Take 10 mg by mouth 3 (three) times daily. )    . pantoprazole (PROTONIX) 40 MG tablet Take 1 tablet (40 mg total) by mouth 2 (two) times daily. (Patient taking differently: Take 40 mg by mouth daily as needed (for acid reflux/GERD). )    . sucralfate (CARAFATE) 1 G tablet Take 1 tablet (1 g total) by mouth 4 (four) times daily -  with meals and at bedtime.    .       Review of Systems PER HPI OTHERWISE ALL SYSTEMS ARE NEGATIVE.    Objective:   Physical Exam  Constitutional: She is oriented to person, place, and time. She appears well-developed and well-nourished. No distress.  HENT:  Head: Normocephalic and atraumatic.  Mouth/Throat: Oropharynx is clear and moist. No oropharyngeal exudate.  Eyes: Pupils are equal, round, and reactive to light. No scleral icterus.  Neck: Normal range of motion. Neck supple.  Cardiovascular: Normal rate and regular rhythm.   Murmur heard. Pulmonary/Chest: Effort normal and breath sounds normal. No respiratory distress.  Abdominal: Soft. Bowel sounds are normal. Distention: MODERATE. There is tenderness (MILD PERIUMBILICAL).  Musculoskeletal: Edema: BIL LEs.  Lymphadenopathy:  She has no cervical adenopathy.  Neurological: She is alert and oriented to person, place, and time.  NO  NEW FOCAL DEFICITS, INTENTION TREMOR IN HANDS AND LEGS  Psychiatric:  FLAT AFFECT, NL MOOD  Vitals reviewed.     Assessment & Plan:

## 2015-07-23 NOTE — Op Note (Signed)
[facility] [address] [place], [address]   ENDOSCOPY PROCEDURE REPORT  PATIENT: Wanda [name], Wanda [name]  MR#: [id number] BIRTHDATE: [birth year] , 39  yrs. old GENDER: female  ENDOSCOPIST: Danie Binder, MD REFERRED BY:   [facility] PROCEDURE DATE: 2015-08-03 PROCEDURE:   EGD, diagnostic  INDICATIONS:screening for varices. MEDICATIONS: Promethazine (Phenergan) 12.5 mg IV, Fentanyl 100 mcg IV, Versed 8 mg IV, Versed 1 mg IV, and Cipro 400 mg IV TOPICAL ANESTHETIC:   Viscous Xylocaine   ASA CLASS:  DESCRIPTION OF PROCEDURE:     Physical exam was performed.  Informed consent was obtained from the patient after explaining the benefits, risks, and alternatives to the procedure.  The patient was connected to the monitor and placed in the left lateral position.  Continuous oxygen was provided by nasal cannula and IV medicine administered through an indwelling cannula.  After administration of sedation, the patients esophagus was intubated and the [id number]  endoscope was advanced under direct visualization to the second portion of the duodenum.  The scope was removed slowly by carefully examining the color, texture, anatomy, and integrity of the mucosa on the way out.  The patient was recovered in endoscopy and discharged home in satisfactory condition.  Estimated blood loss is zero unless otherwise noted in this procedure report.    ESOPHAGUS: GRADE I ESOPHAGEAL VARICES. STOMACH: Moderate portal hypertensive gastropathy was found.   Mild non-erosive gastritis (inflammation) was found in the gastric antrum.   DUODENUM: The duodenal mucosa showed no abnormalities in the bulb and 2nd part of the duodenum.  COMPLICATIONS: There were no immediate complications.  ENDOSCOPIC IMPRESSION: 1.   GRADE I ESOPHAGEAL VARICES 2.   MODERATE Portal hypertensive gastropathy 3.   MILD Non-erosive gastritis  RECOMMENDATIONS: REPEAT EGD IN 1 MONTH  REPEAT  EXAM:    _______________________________ eSignedDanie Binder, MD 2015/08/03 9:44 AM     CPT CODES: ICD CODES:  The ICD and CPT codes recommended by this software are interpretations from the data that the clinical staff has captured with the software.  The verification of the translation of this report to the ICD and CPT codes and modifiers is the sole responsibility of the health care institution and practicing physician where this report was generated.  [facility] will not be held responsible for the validity of the ICD and CPT codes included on this report.  AMA assumes no liability for data contained or not contained herein. CPT is a Designer, television/film set of the [employer].

## 2015-07-23 NOTE — Discharge Instructions (Signed)
You have esophageal varices DUE TO SCARRING IN YOUR LIVER.    [facility], [place].  REPEAT EGD IN 1 MOS.    UPPER ENDOSCOPY AFTER CARE Read the instructions outlined below and refer to this sheet in the next week. These discharge instructions provide you with general information on caring for yourself after you leave the hospital. While your treatment has been planned according to the most current medical practices available, unavoidable complications occasionally occur. If you have any problems or questions after discharge, call DR. Zackarie Chason, [phone].  ACTIVITY  You may resume your regular activity, but move at a slower pace for the next 24 hours.   Take frequent rest periods for the next 24 hours.   Walking will help get rid of the air and reduce the bloated feeling in your belly (abdomen).   No driving for 24 hours (because of the medicine (anesthesia) used during the test).   You may shower.   Do not sign any important legal documents or operate any machinery for 24 hours (because of the anesthesia used during the test).    NUTRITION  Drink plenty of fluids.   You may resume your normal diet as instructed by your doctor.   Begin with a light meal and progress to your normal diet. Heavy or fried foods are harder to digest and may make you feel sick to your stomach (nauseated).   Avoid alcoholic beverages for 24 hours or as instructed.    MEDICATIONS  You may resume your normal medications.   WHAT YOU CAN EXPECT TODAY  Some feelings of bloating in the abdomen.   Passage of more gas than usual.    IF YOU HAD A BIOPSY TAKEN DURING THE UPPER ENDOSCOPY:  Eat a soft diet IF YOU HAVE NAUSEA, BLOATING, ABDOMINAL PAIN, OR VOMITING.    FINDING OUT THE RESULTS OF YOUR TEST Not all test results are available during your visit. DR. Oneida Alar WILL CALL YOU WITHIN 7 DAYS OF YOUR PROCEDUE WITH YOUR RESULTS. Do not assume everything is normal if  you have not heard from DR. Darry Kelnhofer IN ONE WEEK, CALL HER OFFICE AT [phone].  SEEK IMMEDIATE MEDICAL ATTENTION AND CALL THE OFFICE: [phone] IF:  You have more than a spotting of blood in your stool.   Your belly is swollen (abdominal distention).   You are nauseated or vomiting.   You have a temperature over 101F.   You have abdominal pain or discomfort that is severe or gets worse throughout the day.

## 2015-07-23 NOTE — OR Nursing (Signed)
Dr. Oneida Alar notified of decreased BP 85/47 and 87/46 with pulse of 94. Ordered to give 234ml NS bolus from IV bag. Order carried out.

## 2015-07-24 ENCOUNTER — Ambulatory Visit: Payer: Self-pay | Admitting: Gastroenterology

## 2015-07-26 ENCOUNTER — Telehealth: Payer: Self-pay | Admitting: Gastroenterology

## 2015-07-26 NOTE — Telephone Encounter (Signed)
Patient called to see if she could get a para done today. Please advise and call her at [phone]

## 2015-07-26 NOTE — Telephone Encounter (Signed)
Unable to schedule due to un availablity. Spoke with pt and she will call back next week.

## 2015-07-26 NOTE — Telephone Encounter (Signed)
Patient on jan recall for repeat egd

## 2015-07-29 ENCOUNTER — Encounter (HOSPITAL_COMMUNITY): Payer: Self-pay | Admitting: Gastroenterology

## 2015-07-29 NOTE — Telephone Encounter (Signed)
Called pt and was unable to leave message

## 2015-07-31 ENCOUNTER — Encounter (HOSPITAL_COMMUNITY): Payer: Self-pay

## 2015-07-31 ENCOUNTER — Ambulatory Visit (HOSPITAL_COMMUNITY)
Admission: RE | Admit: 2015-07-31 | Discharge: 2015-07-31 | Disposition: A | Discharge status: Home / Self Care | Payer: Medicare Other | Source: Ambulatory Visit | Attending: Gastroenterology | Admitting: Gastroenterology

## 2015-07-31 DIAGNOSIS — R188 Other ascites: Secondary | ICD-10-CM | POA: Insufficient documentation

## 2015-07-31 DIAGNOSIS — K7460 Unspecified cirrhosis of liver: Secondary | ICD-10-CM | POA: Insufficient documentation

## 2015-07-31 LAB — BODY FLUID CELL COUNT WITH DIFFERENTIAL
Eos, Fluid: 1 %
LYMPHS FL: 67 %
MONOCYTE-MACROPHAGE-SEROUS FLUID: 17 % — AB (ref 50–90)
NEUTROPHIL FLUID: 15 % (ref 0–25)
WBC FLUID: 133 uL (ref 0–1000)

## 2015-07-31 LAB — GRAM STAIN

## 2015-07-31 MED ORDER — ALBUMIN HUMAN 25 % IV SOLN
INTRAVENOUS | Status: AC
  Administered 2015-07-31: 50 g via INTRAVENOUS
  Filled 2015-07-31: qty 200

## 2015-07-31 MED ORDER — ALBUMIN HUMAN 25 % IV SOLN
50.0000 g | Freq: Once | INTRAVENOUS | Status: AC
  Administered 2015-07-31: 50 g via INTRAVENOUS

## 2015-07-31 NOTE — Procedures (Signed)
PreOperative Dx: Cirrhosis, ascites Postoperative Dx: Cirrhosis, ascites Procedure:   US guided paracentesis Radiologist:  Lasaundra Riche Anesthesia:  10 ml of 1% lidocaine Specimen:  4500 ml of yellow ascitic fluid EBL:   < 1 ml Complications: None  

## 2015-07-31 NOTE — Progress Notes (Signed)
Paracentesis complete no signs of distress. 4500 ml yellow colored ascites removed.  

## 2015-08-01 LAB — PATHOLOGIST SMEAR REVIEW

## 2015-08-05 LAB — CULTURE, BODY FLUID-BOTTLE: CULTURE: NO GROWTH

## 2015-08-05 LAB — CULTURE, BODY FLUID W GRAM STAIN -BOTTLE

## 2015-08-06 ENCOUNTER — Telehealth: Payer: Self-pay | Admitting: Gastroenterology

## 2015-08-06 NOTE — Telephone Encounter (Signed)
Pt to be seen [facility] on 08/07/2015

## 2015-08-06 NOTE — Telephone Encounter (Signed)
Pt said she is not referring to Carafate. She said it was an antibiotic. I called [facility] and was told by pharmacist that pt had been given Ceftin 250 mg by Dr. Oneida Alar on 04/26/2015 a 10 day supply for bid. She had been given 7 day supply of Cipro on  11/01/2014 by Dr. Jerilee Hoh.

## 2015-08-06 NOTE — Telephone Encounter (Signed)
I informed the pt of Anna's recommendation. She said that she really does need something for the abdominal pain and she would like to come in to avoid an ED visit.  I have scheduled her an OV with Neil Crouch, PA on 08/07/2015 at 11:30 Am.  Forwarding to [place] for review prior to her Urgent OV appt.

## 2015-08-06 NOTE — Telephone Encounter (Signed)
Dr. Oneida Alar gave Cipro one time dosing when performing EGD for banding. This is not needed as an outpatient. She may be referring to another medication, but I am not sure about this. Is she referring to Carafate liquid?

## 2015-08-06 NOTE — Telephone Encounter (Signed)
She was given the antibiotics due to concern for possible post-paracentesis complication. Would not give abx unless seen in person. Needs to titrate lactulose so that she is having 3 soft BMs a day. If having diarrhea, may need to cut back slightly on the dosing. That could be causing cramping.

## 2015-08-06 NOTE — Telephone Encounter (Signed)
Tried to call pt to get more information. Could not leave Vm.

## 2015-08-06 NOTE — Telephone Encounter (Signed)
Pt called back. Said she has been having abdominal cramps really bad for the last several days. The pain is constant and is on both sides of her umbilicus. She is not having diarrhea, just loose stools a couple of times a day.  She said before when she had this Dr. Oneida Alar gave her some Cipro. Please advise. Sending to Laban Emperor, NP in Dr. Oneida Alar absence.

## 2015-08-06 NOTE — Telephone Encounter (Signed)
PATIENT CALLED AND STATED THAT SHE NEEDS ANTIBIOTIC CALLED IN AGAIN. HAVING STOMACH PAIN.  PLEASE ADVISE [phone]

## 2015-08-07 ENCOUNTER — Encounter: Payer: Self-pay | Admitting: Gastroenterology

## 2015-08-07 ENCOUNTER — Other Ambulatory Visit: Payer: Self-pay

## 2015-08-07 ENCOUNTER — Ambulatory Visit (INDEPENDENT_AMBULATORY_CARE_PROVIDER_SITE_OTHER): Payer: Medicare Other | Admitting: Gastroenterology

## 2015-08-07 VITALS — BP 108/71 | HR 93 | Temp 97.6°F | Ht 62.0 in | Wt 126.4 lb

## 2015-08-07 DIAGNOSIS — K7460 Unspecified cirrhosis of liver: Secondary | ICD-10-CM

## 2015-08-07 DIAGNOSIS — K7581 Nonalcoholic steatohepatitis (NASH): Secondary | ICD-10-CM

## 2015-08-07 DIAGNOSIS — K429 Umbilical hernia without obstruction or gangrene: Secondary | ICD-10-CM | POA: Diagnosis not present

## 2015-08-07 DIAGNOSIS — R1030 Lower abdominal pain, unspecified: Secondary | ICD-10-CM

## 2015-08-07 DIAGNOSIS — I8510 Secondary esophageal varices without bleeding: Secondary | ICD-10-CM

## 2015-08-07 DIAGNOSIS — R109 Unspecified abdominal pain: Secondary | ICD-10-CM

## 2015-08-07 DIAGNOSIS — I8500 Esophageal varices without bleeding: Secondary | ICD-10-CM

## 2015-08-07 MED ORDER — CEFUROXIME AXETIL 250 MG PO TABS: 250.0000 mg | ORAL_TABLET | Freq: Two times a day (BID) | ORAL | Status: DC

## 2015-08-07 MED ORDER — OXYCODONE HCL 10 MG PO TABS: ORAL_TABLET | ORAL | Status: DC

## 2015-08-07 NOTE — Progress Notes (Signed)
Primary Care Physician: [name], MD  Primary Gastroenterologist:  Barney Drain, MD   Chief Complaint  Patient presents with  . Abdominal Pain    HPI: Wanda [name] is a 39 y.o. female here for urgent OV. Last seen 07/23/15 at time of EGD.  Grade 1 EV, moderate portal hypertensive gastropathy, mild non-erosive gastritis. H/O bleeding esophageal varices in past. She is due for repeat EGD in mid-January. Will premedicate with phenergan 12.5mg  IV and Cipro 400mg  IV prior to procedure. Last LVAP 07/31/15 with 4500cc ascitic fluid removed. Cell count with diff, cultures, pathology review unremarkable.   Patient states 2 days after her last abdominal paracentesis she began having more abdominal pain than usual. Pain located in the lower abdomen, skin somewhat red. Umbilical hernia more swollen, chronically has pain here. No fever, vomiting. She has not been to dialysis in 6 days. States that she is having a lot of cramping at dialysis last Thursday and had told them they would bring her on Saturday but when she showed up that he did not have an appointment for her. She was not able to keep her appointment yesterday. Goes tomorrow for dialysis. Bowel movement twice per day. No blood in the stool or melena. Appetite usually good. She is not sure when her next appointment at [facility] is. It appears to be on January 30 according to care everywhere. Well overdue for liver imaging. Discussed with patient that if she keeps an appointment at [facility] in January we would wait to see if they were going to do her imaging there. Last labs available to me were done 2 months ago.   Current Outpatient Prescriptions  Medication Sig Dispense Refill  . calcium acetate (PHOSLO) 667 MG capsule Take 1 capsule (667 mg total) by mouth 3 (three) times daily with meals. 90 capsule 0  . Darbepoetin Alfa (ARANESP) 60 MCG/0.3ML SOSY injection Inject 0.3 mLs (60 mcg total) into the vein every Thursday with hemodialysis.  4.2 mL   . lactulose (CHRONULAC) 10 GM/15ML solution TAKE 3 TEASPOONSFUL (15ML) BY MOUTH TWICE DAILY 946 mL 5  . lidocaine (XYLOCAINE) 2 % solution TAKE TWO TEASPOONSFUL (10ML) BY MOUTH BEFORE MEALS AND AT BEDTIME TO PREVENT CHEST PAIN WHILE EATING 500 mL 0  . lidocaine-prilocaine (EMLA) cream Apply 1 application topically See admin instructions. Applied prior to port access  6  . LORazepam (ATIVAN) 1 MG tablet Take 0.5-1 tablets (0.5-1 mg total) by mouth 2 (two) times daily as needed (FOR BACK SPASM OR ANXIETY). FOR BACK SPASM OR ANXIETY 45 tablet 1  . midodrine (PROAMATINE) 10 MG tablet Take 1 tablet (10 mg total) by mouth daily. *Takes only on dialysis days on Tuesdays, Thursdays, and Saturdays (Sometimes takes treatments on Friday instead of Saturday)**Medication to keep your blood pressure from falling. 60 tablet 3  . multivitamin (RENA-VIT) TABS tablet Take 1 tablet by mouth at bedtime. 30 tablet 0  . Oxycodone HCl 10 MG TABS 1 PO TID AS NEEDED FOR PAIN (Patient taking differently: Take 10 mg by mouth 3 (three) times daily. ) 90 tablet 0  . pantoprazole (PROTONIX) 40 MG tablet Take 1 tablet (40 mg total) by mouth 2 (two) times daily. (Patient taking differently: Take 40 mg by mouth daily as needed (for acid reflux/GERD). ) 60 tablet 2  . rifaximin (XIFAXAN) 550 MG TABS tablet Take 1 tablet (550 mg total) by mouth 2 (two) times daily. 60 tablet 0  . sucralfate (CARAFATE) 1 GM/10ML suspension Take  10 mLs (1 g total) by mouth 4 (four) times daily. 420 mL 1   No current facility-administered medications for this visit.    Allergies as of 08/07/2015 - Review Complete 08/07/2015  Allergen Reaction Noted  . Lasix [furosemide]  04/12/2014  . Latex Itching 05/14/2015  . Morphine and related Hives and Itching 09/16/2014   Past Medical History  Diagnosis Date  . GERD (gastroesophageal reflux disease)   . Cirrhosis ([place]) 10/05/13    Liver bx 11/23/13 (delayed initially due to patient refusal). c/w  steatohepatitis  . Folate deficiency 09/2013  . Anasarca 10/10/2013  . Hematemesis/vomiting blood 02/24/2014  . Acute blood loss anemia 02/25/2014    Status post transfusion  . Macrocytosis 02/28/2014  . Bleeding esophageal varices ([place]) 02/28/2014    s/p banding  . Acute renal failure ([place]) 09/2013    Pre-renal- resolved  . C. difficile colitis 04/19/2014  . Anxiety   . Depression   . Thrombocytopenia ([place])     Hypercellular bone marrow; abundant megakaryocytes per 08/27/2014; s/p bone marrow bx  . Cirrhosis of liver with ascites ([place])   . SBP (spontaneous bacterial peritonitis) ([place]) 11/10/2013  . Bipolar disorder ([place]) 12/04/2013    2007-SEEN IN ED FOR INVOLUNTARY COMMITMENT, UDS POS FOR AMPHETAMINES/OPIATES   . PNA (pneumonia) 10/13/2013  . Chronic hypotension   . Gastroesophageal junction ulcer 09/17/2014  . ESRD (end stage renal disease) on dialysis ([place]) 08/2014   Past Surgical History  Procedure Laterality Date  . None    . Paracentesis  Feb 2015    1180 fluid, negative fluid analysis.   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 Paracentesis  10/2013  . Colonoscopy N/A 12/19/2013    SLF:NO OBVIOUS SOURCE FOR ANEMIA IDETIFIED/ONE COLON POLYP REMOVED/Small internal hemorrhoids  . Esophagogastroduodenoscopy N/A 02/11/2014    Dr. Rourk:Esophageal varices with bleeding stigmata-status post esophageal band ligation therapy. Portal gastropathy  . Esophagogastroduodenoscopy N/A 07/04/2014    RMR: Persiting grade 2 esophageal varicies with bleeding stigmata status post band ligation. Significantly congested gastric mucosa iwith changes constistant with protal gastropathy.   . Esophageal banding  07/04/2014    Procedure: ESOPHAGEAL BANDING;  Surgeon: Daneil Dolin, MD;  Location: [facility];  Service: Endoscopy;;  . Esophagogastroduodenoscopy (egd) with propofol  N/A 07/24/2014    SLF:  1. 2 columns grade 2-3 varices- 2 Bands applied.  2.  Moderate gastropathy 3. Duodenal Diverticula  . Esophageal banding N/A 07/24/2014    Procedure: ESOPHAGEAL BANDING (2 bands applied);  Surgeon: Danie Binder, MD;  Location: [facility];  Service: Endoscopy;  Laterality: N/A;  . Central venous catheter insertion Right   . Esophagogastroduodenoscopy N/A 09/16/2014    Rehman: Single short column of varix proximal to GE junction not large enough to be banded. Two amall ulcers at the GEJ felt to be source of GI Bleeding but no active bleeding but no actibe bleeding noted. No therapy rendered. Portal gastropathy NO evidence of peptic ulcer diease or gastric varices.   . Esophagogastroduodenoscopy (egd) with propofol N/A 10/26/2014    RMR: 2 columns of grade 2 esophageal varices without obvious bleeding stigmata status post band ligation to complet obliteration of remaining varices.   . Av fistula placement Right 11/16/2014    Procedure: Right arm Creation of arteriovenous fistula;  Surgeon: Angelia Mould, MD;  Location: [place];  Service: Vascular;  Laterality: Right;  .  Esophagogastroduodenoscopy N/A 12/14/2014    SLF: Grade ! esophageal varices. 2. Moderate Portal Gastropathy  . Esophagogastroduodenoscopy N/A 03/27/2015    Procedure: ESOPHAGOGASTRODUODENOSCOPY (EGD);  Surgeon: Danie Binder, MD;  [facility]: [facility];  Service: Endoscopy;  Laterality: N/A;  1045am - moved to 817 @ 11:30  . Esophagogastroduodenoscopy N/A 06/05/2015    Procedure: ESOPHAGOGASTRODUODENOSCOPY (EGD);  Surgeon: Clarene Essex, MD;  Location: [facility];  Service: Endoscopy;  Laterality: N/A;  . Esophagogastroduodenoscopy N/A 07/23/2015    SLF:1. Grade 1 esophageal varices 2. Moderate portal hypertensive gastropathy 3. MILd non-erosive gastritis.   . Esophageal banding N/A 07/23/2015    Procedure: ESOPHAGEAL BANDING;  Surgeon: Danie Binder, MD;  [facility]: [facility];  Service: Endoscopy;   Laterality: N/A;   Family History  Problem Relation Age of Onset  . Heart disease Mother   . Colon cancer Neg Hx   . Liver disease Neg Hx    Social History   Social History  . Marital Status: Single    Spouse Name: N/A  . Number of Children: 1  . Years of Education: N/A   Occupational History  . unemployed    Social History Main Topics  . Smoking status: Former Smoker -- 0.25 packs/day for 20 years    Types: Cigarettes    Quit date: 11/05/2008  . Smokeless tobacco: Never Used  . Alcohol Use: No  . Drug Use: No  . Sexual Activity:    Partners: Male    Birth Control/ Protection: None   Other Topics Concern  . None   Social History Narrative    ROS:  General: Negative for anorexia, weight loss, fever, chills, +fatigue ENT: Negative for hoarseness, difficulty swallowing , nasal congestion. CV: Negative for chest pain, angina, palpitations, dyspnea on exertion, peripheral edema.  Respiratory: Negative for dyspnea at rest, dyspnea on exertion, cough, sputum, wheezing.  GI: See history of present illness. GU:  Negative for dysuria, hematuria, urinary incontinence, urinary frequency, nocturnal urination.  Endo: Negative for unusual weight change.    Physical Examination:   BP 108/71 mmHg  Pulse 93  Temp(Src) 97.6 F (36.4 C) (Oral)  Ht 5\' 2"  (1.575 m)  Wt 126 lb 6.4 oz (57.335 kg)  BMI 23.11 kg/m2  LMP 09/19/2013  General: Chronically ill-appearing female company by her boyfriend. No acute distress.   Eyes: No icterus. Mouth: Oropharyngeal mucosa moist and pink , no lesions erythema or exudate. Lungs: Clear to auscultation bilaterally.  Heart: Regular rate and rhythm, no murmurs rubs or gallops.  Abdomen: Examined in wheelchair. Patient declined to try to get on the exam table. Bowel sounds are normal, minimal distention. Umbilical herniation, easily reducible, slightly tender. Mild redness left abdomen at site of previous LVAP. no rebound or guarding.     Extremities: No lower extremity edema. No clubbing or deformities. Neuro: Alert and oriented x 4   Skin: Warm and dry, no jaundice.   Psych: Alert and cooperative, normal mood and affect.  Labs:  Lab Results  Component Value Date   WBC 6.9 06/09/2015   HGB 8.4* 06/09/2015   HCT 26.0* 06/09/2015   MCV 100.4* 06/09/2015   PLT 87* 06/09/2015   Lab Results  Component Value Date   CREATININE 2.80* 06/09/2015   BUN 30* 06/09/2015   NA 136 06/09/2015   K 3.5 06/09/2015   CL 101 06/09/2015   CO2 25 06/09/2015   Lab Results  Component Value Date   ALT 33 06/09/2015   AST 59* 06/09/2015  ALKPHOS 53 06/09/2015   BILITOT 1.3* 06/09/2015   Lab Results  Component Value Date   INR 1.23 06/07/2015   INR 1.29 06/04/2015   INR 1.17 11/16/2014    Imaging Studies: US Paracentesis  07/31/2015  CLINICAL DATA:  Cirrhosis, ascites EXAM: ULTRASOUND GUIDED DIAGNOSTIC AND THERAPEUTIC PARACENTESIS COMPARISON:  07/17/2015 PROCEDURE: Procedure, benefits, and risks of procedure were discussed with patient. Written informed consent for procedure was obtained. Time out protocol followed. Adequate collection of ascites localized by ultrasound in LEFT lower quadrant. Skin prepped and draped in usual sterile fashion. Skin and soft tissues anesthetized with 10 mL of 1% lidocaine. 5 [place] Yueh catheter placed into peritoneal cavity. 4500 mL of yellow ascitic fluid aspirated by vacuum bottle suction. Procedure tolerated well by patient without immediate complication. COMPLICATIONS: None. FINDINGS: A total of approximately 4500 mL of ascitic fluid was removed. A fluid sample of 180 mL was sent for laboratory analysis. IMPRESSION: Successful ultrasound guided paracentesis yielding 4500 mL of ascites. Electronically Signed   By: Lavonia Dana M.D.   On: 07/31/2015 16:48   US Paracentesis  07/17/2015  CLINICAL DATA:  Ascites EXAM: ULTRASOUND GUIDED  PARACENTESIS COMPARISON:  None. PROCEDURE: An ultrasound guided  paracentesis was thoroughly discussed with the patient and questions answered. The benefits, risks, alternatives and complications were also discussed. The patient understands and wishes to proceed with the procedure. Written consent was obtained. Ultrasound was performed to localize and mark an adequate pocket of fluid in the right lower quadrant of the abdomen. The area was then prepped and draped in the normal sterile fashion. 1% Lidocaine was used for local anesthesia. Under ultrasound guidance a 19 gauge Yueh catheter was introduced. Paracentesis was performed. The catheter was removed and a dressing applied. COMPLICATIONS: None. FINDINGS: A total of approximately 5.3 L of clear yellow fluid was removed. A fluid sample was sent for laboratory analysis. IMPRESSION: Successful ultrasound guided paracentesis yielding 5.3 L of ascites. Electronically Signed   By: Rolm Baptise M.D.   On: 07/17/2015 12:10    Impression/plan  39 year old female with history of cirrhosis felt to be due to [facility], complicated by esophageal variceal bleeding, recurrent ascites, hepatic encephalopathy in the past who presents with acute on chronic abdominal pain. Symptoms began 2 days after abdominal paracentesis. Fluid analysis unremarkable. Has not been to dialysis due to pain. Similar symptoms back in 04/2015 responded to ceftin for possible localized skin infection. I suspect symptoms likely similar but lengthy discussion with patient and boyfriend that we cannot rule out symptoms related to umbilical hernia.   1. Trial of Ceftin for 10 days. If symptoms worsen, she will go to ER. If don't resolve she will let me know. 2. Egd in mid-January for esophageal variceal banding. Premedicate with Phenergan 12.5mg  IV and Cipro 400mg  IV. 3. Determine when [facility] follow up appointment is. If soon, hold off on liver imaging until appointment there. Otherwise we can pursue abd u/s locally to screen for hepatoma. 4. One month refill on  oxycodone. Will request Dr. Oneida Alar to write multiple refills for upcoming months as in the past.  Laureen Ochs. Bernarda Caffey [facility] [phone] 12/28/20161:00 PM

## 2015-08-07 NOTE — Progress Notes (Signed)
cc'ed to pcp °

## 2015-08-07 NOTE — Patient Instructions (Signed)
1. Upper endoscopy as scheduled. See separate instructions.  2. One month refill of oxycodone provided. I will request Dr. Oneida Alar to give you future refills.  3. Ceftin one twice daily for 10 days. If your abdominal pain does not improve, you need to let us know OR if worsens go to ER.  4. We will touch base with [facility] regarding next appointment. Further recommendations to follow.

## 2015-08-16 ENCOUNTER — Telehealth: Payer: Self-pay | Admitting: Gastroenterology

## 2015-08-16 ENCOUNTER — Encounter (HOSPITAL_COMMUNITY): Payer: Self-pay

## 2015-08-16 ENCOUNTER — Ambulatory Visit (HOSPITAL_COMMUNITY)
Admission: RE | Admit: 2015-08-16 | Discharge: 2015-08-16 | Disposition: A | Discharge status: Home / Self Care | Payer: Medicare Other | Source: Ambulatory Visit | Attending: Gastroenterology | Admitting: Gastroenterology

## 2015-08-16 DIAGNOSIS — K7581 Nonalcoholic steatohepatitis (NASH): Secondary | ICD-10-CM | POA: Insufficient documentation

## 2015-08-16 DIAGNOSIS — R188 Other ascites: Secondary | ICD-10-CM | POA: Insufficient documentation

## 2015-08-16 LAB — BODY FLUID CELL COUNT WITH DIFFERENTIAL
EOS FL: 0 %
Lymphs, Fluid: 37 %
MONOCYTE-MACROPHAGE-SEROUS FLUID: 59 % (ref 50–90)
Neutrophil Count, Fluid: 4 % (ref 0–25)
Total Nucleated Cell Count, Fluid: 105 cu mm (ref 0–1000)

## 2015-08-16 NOTE — Progress Notes (Signed)
Paracentesis complete no signs of distress.  

## 2015-08-16 NOTE — Telephone Encounter (Signed)
Pt called today asking if she could get a para done today. Please advise. TD:[phone]

## 2015-08-16 NOTE — Telephone Encounter (Signed)
Pt is going in at 1:20 today for PARA

## 2015-08-18 IMAGING — US US PARACENTESIS
1 series · 4 of 4 positions shown · non-contrast
Comparison: None.

CLINICAL DATA: Ascites

EXAM:
ULTRASOUND GUIDED  PARACENTESIS

[Series 1: us paracentesis · 0.21mm/px · 4 of 4 slices shown]
[im 1/4]
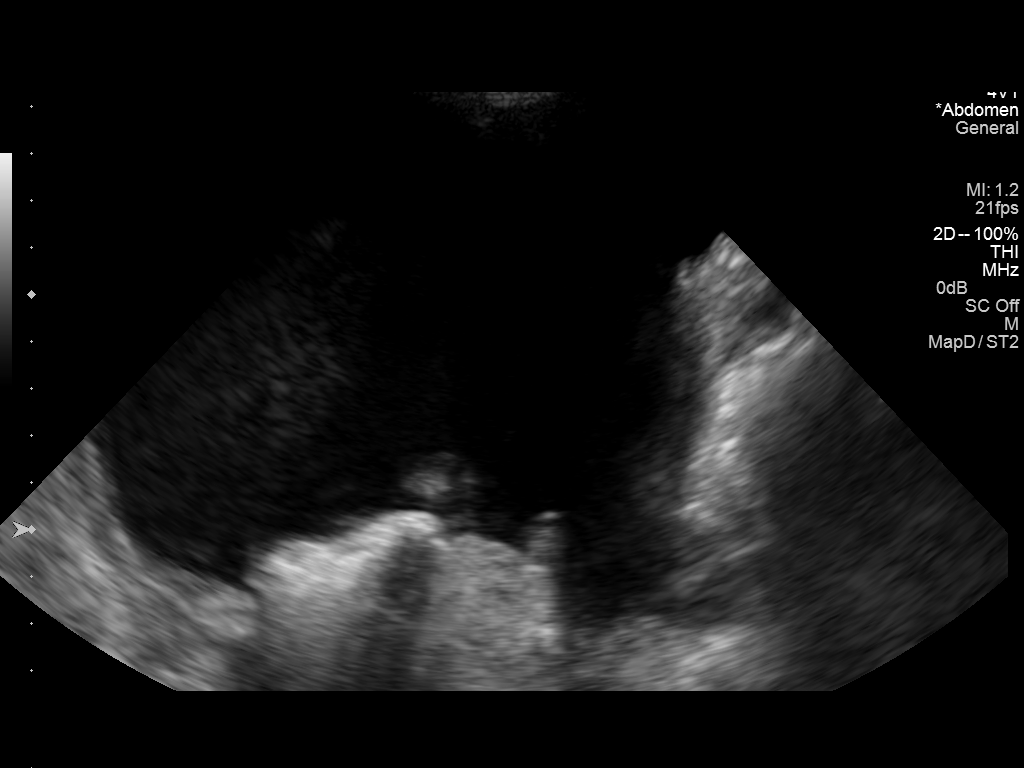
[im 2/4]
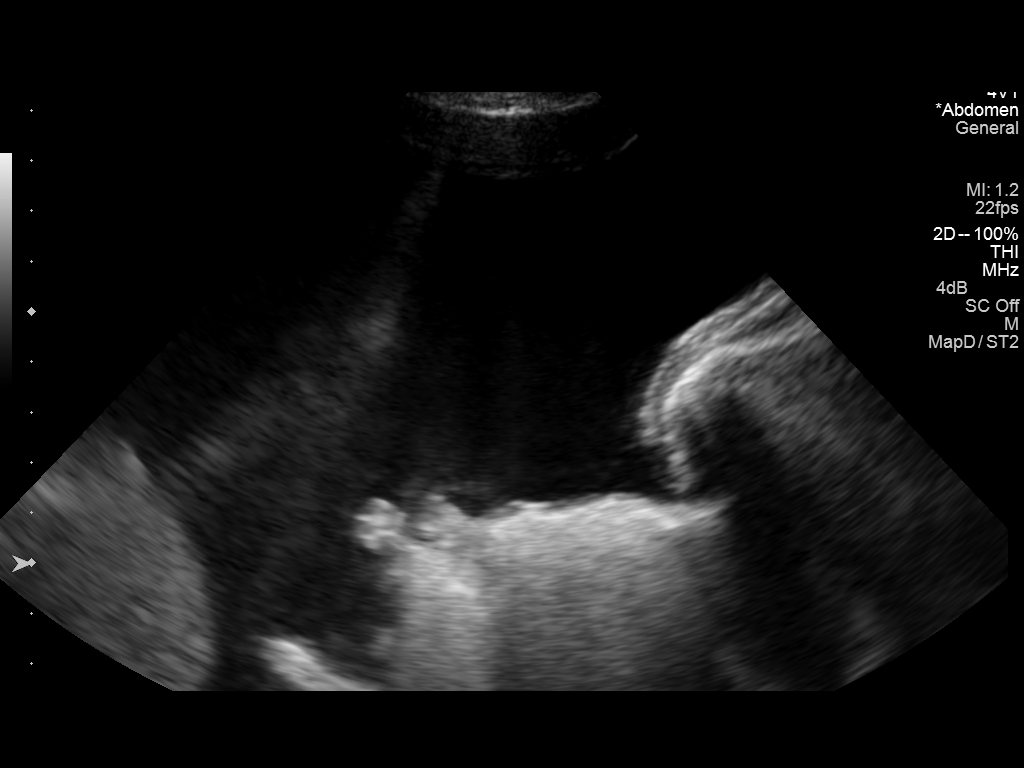
[im 3/4]
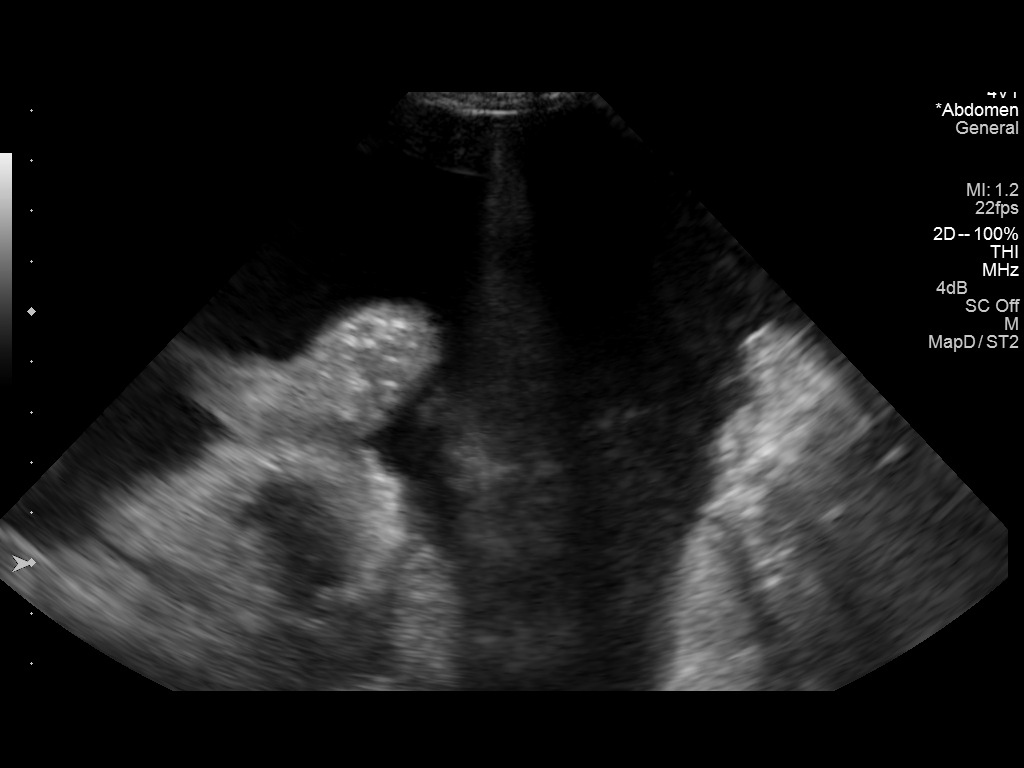
[im 4/4]
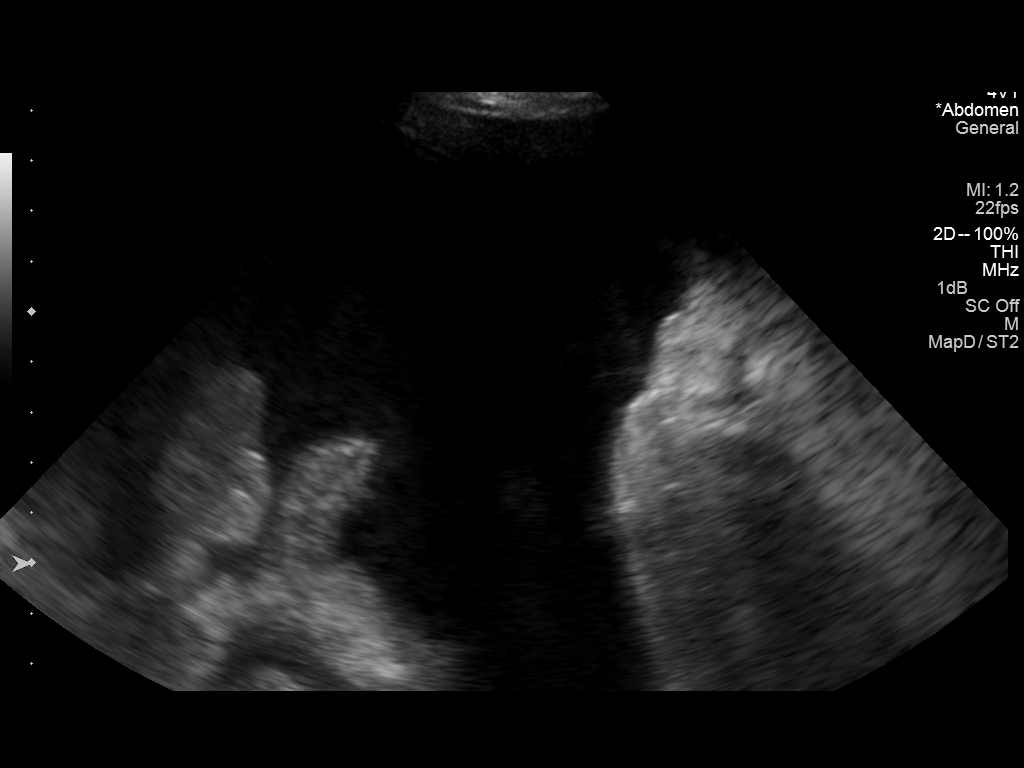

[4 of 4 positions shown; findings below may reference images not displayed]

PROCEDURE:
An ultrasound guided paracentesis was thoroughly discussed with the
patient and questions answered. The benefits, risks, alternatives
and complications were also discussed. The patient understands and
wishes to proceed with the procedure. Written consent was obtained.

Ultrasound was performed to localize and mark an adequate pocket of
fluid in the right lower quadrant of the abdomen. The area was then
prepped and draped in the normal sterile fashion. 1% Lidocaine was
used for local anesthesia. Under ultrasound guidance a 19 gauge Yueh
catheter was introduced. Paracentesis was performed. The catheter
was removed and a dressing applied.

COMPLICATIONS:
None.
FINDINGS: A total of approximately 8.7 L of clear yellow fluid was removed. A
fluid sample was not sent for laboratory analysis.
IMPRESSION: Successful ultrasound guided paracentesis yielding 8.7 L of ascites.

## 2015-08-19 ENCOUNTER — Telehealth: Payer: Self-pay | Admitting: Gastroenterology

## 2015-08-19 ENCOUNTER — Other Ambulatory Visit: Payer: Self-pay

## 2015-08-19 ENCOUNTER — Other Ambulatory Visit: Payer: Self-pay | Admitting: Gastroenterology

## 2015-08-19 DIAGNOSIS — R188 Other ascites: Secondary | ICD-10-CM

## 2015-08-19 LAB — PATHOLOGIST SMEAR REVIEW

## 2015-08-19 MED ORDER — LIDOCAINE VISCOUS 2 % MT SOLN: OROMUCOSAL | Status: DC

## 2015-08-19 NOTE — Telephone Encounter (Signed)
Rx sent to pharmacy.  Please notify patient.

## 2015-08-19 NOTE — Telephone Encounter (Signed)
Patient called to see if she could get her Lidocaine refilled and she uses [facility]. [phone]

## 2015-08-19 NOTE — Telephone Encounter (Signed)
Sending to the refill box.  

## 2015-08-20 ENCOUNTER — Inpatient Hospital Stay (HOSPITAL_COMMUNITY)
Admission: EM | Admit: 2015-08-20 | Discharge: 2015-08-22 | DRG: 602 | Disposition: A | Discharge status: Home / Self Care | Payer: Medicare Other | Attending: Internal Medicine | Admitting: Internal Medicine

## 2015-08-20 ENCOUNTER — Emergency Department (HOSPITAL_COMMUNITY): Payer: Medicare Other

## 2015-08-20 ENCOUNTER — Other Ambulatory Visit: Payer: Self-pay

## 2015-08-20 ENCOUNTER — Encounter (HOSPITAL_COMMUNITY): Payer: Self-pay | Admitting: *Deleted

## 2015-08-20 ENCOUNTER — Telehealth: Payer: Self-pay

## 2015-08-20 DIAGNOSIS — E871 Hypo-osmolality and hyponatremia: Secondary | ICD-10-CM | POA: Diagnosis present

## 2015-08-20 DIAGNOSIS — K7460 Unspecified cirrhosis of liver: Secondary | ICD-10-CM | POA: Diagnosis present

## 2015-08-20 DIAGNOSIS — F419 Anxiety disorder, unspecified: Secondary | ICD-10-CM | POA: Diagnosis present

## 2015-08-20 DIAGNOSIS — Z6823 Body mass index (BMI) 23.0-23.9, adult: Secondary | ICD-10-CM

## 2015-08-20 DIAGNOSIS — Z87891 Personal history of nicotine dependence: Secondary | ICD-10-CM | POA: Diagnosis not present

## 2015-08-20 DIAGNOSIS — I8500 Esophageal varices without bleeding: Secondary | ICD-10-CM | POA: Diagnosis present

## 2015-08-20 DIAGNOSIS — I9589 Other hypotension: Secondary | ICD-10-CM | POA: Diagnosis present

## 2015-08-20 DIAGNOSIS — K7581 Nonalcoholic steatohepatitis (NASH): Secondary | ICD-10-CM | POA: Diagnosis present

## 2015-08-20 DIAGNOSIS — D649 Anemia, unspecified: Secondary | ICD-10-CM | POA: Diagnosis present

## 2015-08-20 DIAGNOSIS — L03311 Cellulitis of abdominal wall: Principal | ICD-10-CM | POA: Diagnosis present

## 2015-08-20 DIAGNOSIS — R1032 Left lower quadrant pain: Secondary | ICD-10-CM | POA: Diagnosis present

## 2015-08-20 DIAGNOSIS — Z79891 Long term (current) use of opiate analgesic: Secondary | ICD-10-CM

## 2015-08-20 DIAGNOSIS — K219 Gastro-esophageal reflux disease without esophagitis: Secondary | ICD-10-CM | POA: Diagnosis present

## 2015-08-20 DIAGNOSIS — R109 Unspecified abdominal pain: Secondary | ICD-10-CM | POA: Diagnosis present

## 2015-08-20 DIAGNOSIS — D696 Thrombocytopenia, unspecified: Secondary | ICD-10-CM | POA: Diagnosis present

## 2015-08-20 DIAGNOSIS — G894 Chronic pain syndrome: Secondary | ICD-10-CM | POA: Diagnosis present

## 2015-08-20 DIAGNOSIS — N186 End stage renal disease: Secondary | ICD-10-CM | POA: Diagnosis present

## 2015-08-20 DIAGNOSIS — G8929 Other chronic pain: Secondary | ICD-10-CM | POA: Diagnosis present

## 2015-08-20 DIAGNOSIS — Z992 Dependence on renal dialysis: Secondary | ICD-10-CM | POA: Diagnosis not present

## 2015-08-20 DIAGNOSIS — D6959 Other secondary thrombocytopenia: Secondary | ICD-10-CM | POA: Diagnosis present

## 2015-08-20 DIAGNOSIS — I959 Hypotension, unspecified: Secondary | ICD-10-CM | POA: Diagnosis present

## 2015-08-20 LAB — URINALYSIS, ROUTINE W REFLEX MICROSCOPIC
Glucose, UA: NEGATIVE mg/dL
KETONES UR: NEGATIVE mg/dL
Leukocytes, UA: NEGATIVE
NITRITE: NEGATIVE
pH: 5.5 (ref 5.0–8.0)

## 2015-08-20 LAB — CBC WITH DIFFERENTIAL/PLATELET
BASOS ABS: 0.1 10*3/uL (ref 0.0–0.1)
BASOS PCT: 1 %
EOS ABS: 0.1 10*3/uL (ref 0.0–0.7)
Eosinophils Relative: 3 %
HCT: 37.2 % (ref 36.0–46.0)
Hemoglobin: 12 g/dL (ref 12.0–15.0)
Lymphocytes Relative: 19 %
Lymphs Abs: 0.9 10*3/uL (ref 0.7–4.0)
MCH: 30.9 pg (ref 26.0–34.0)
MCHC: 32.3 g/dL (ref 30.0–36.0)
MCV: 95.9 fL (ref 78.0–100.0)
Monocytes Absolute: 0.4 10*3/uL (ref 0.1–1.0)
Monocytes Relative: 7 %
NEUTROS ABS: 3.4 10*3/uL (ref 1.7–7.7)
NEUTROS PCT: 70 %
PLATELETS: 88 10*3/uL — AB (ref 150–400)
RBC: 3.88 MIL/uL (ref 3.87–5.11)
RDW: 16.6 % — AB (ref 11.5–15.5)
SMEAR REVIEW: DECREASED
WBC: 4.9 10*3/uL (ref 4.0–10.5)

## 2015-08-20 LAB — AMMONIA: AMMONIA: 69 umol/L — AB (ref 9–35)

## 2015-08-20 LAB — COMPREHENSIVE METABOLIC PANEL
ALK PHOS: 116 U/L (ref 38–126)
ALT: 37 U/L (ref 14–54)
ANION GAP: 16 — AB (ref 5–15)
AST: 79 U/L — ABNORMAL HIGH (ref 15–41)
Albumin: 3.4 g/dL — ABNORMAL LOW (ref 3.5–5.0)
BUN: 98 mg/dL — ABNORMAL HIGH (ref 6–20)
CALCIUM: 9.2 mg/dL (ref 8.9–10.3)
CO2: 21 mmol/L — ABNORMAL LOW (ref 22–32)
CREATININE: 5.95 mg/dL — AB (ref 0.44–1.00)
Chloride: 97 mmol/L — ABNORMAL LOW (ref 101–111)
GFR, EST AFRICAN AMERICAN: 9 mL/min — AB (ref >60)
GFR, EST NON AFRICAN AMERICAN: 8 mL/min — AB (ref >60)
Glucose, Bld: 102 mg/dL — ABNORMAL HIGH (ref 65–99)
Potassium: 3.9 mmol/L (ref 3.5–5.1)
SODIUM: 134 mmol/L — AB (ref 135–145)
Total Bilirubin: 2.1 mg/dL — ABNORMAL HIGH (ref 0.3–1.2)
Total Protein: 6.9 g/dL (ref 6.5–8.1)

## 2015-08-20 LAB — URINE MICROSCOPIC-ADD ON
Squamous Epithelial / LPF: NONE SEEN
WBC UA: NONE SEEN WBC/hpf (ref 0–5)

## 2015-08-20 LAB — RAPID URINE DRUG SCREEN, HOSP PERFORMED
Amphetamines: NOT DETECTED
Barbiturates: NOT DETECTED
Benzodiazepines: POSITIVE — AB
Cocaine: NOT DETECTED
OPIATES: POSITIVE — AB
TETRAHYDROCANNABINOL: NOT DETECTED

## 2015-08-20 LAB — LIPASE, BLOOD: LIPASE: 47 U/L (ref 11–51)

## 2015-08-20 MED ORDER — OXYCODONE HCL 5 MG PO TABS
10.0000 mg | ORAL_TABLET | Freq: Three times a day (TID) | ORAL | Status: DC
  Administered 2015-08-20 – 2015-08-22 (×9): 10 mg via ORAL
  Filled 2015-08-20 (×9): qty 2

## 2015-08-20 MED ORDER — SODIUM CHLORIDE 0.9 % IV BOLUS (SEPSIS)
500.0000 mL | Freq: Once | INTRAVENOUS | Status: AC
  Administered 2015-08-20: 500 mL via INTRAVENOUS

## 2015-08-20 MED ORDER — LORAZEPAM 0.5 MG PO TABS
0.5000 mg | ORAL_TABLET | Freq: Two times a day (BID) | ORAL | Status: DC | PRN
  Administered 2015-08-22: 0.5 mg via ORAL
  Filled 2015-08-20: qty 1

## 2015-08-20 MED ORDER — IOHEXOL 300 MG/ML  SOLN
100.0000 mL | Freq: Once | INTRAMUSCULAR | Status: AC | PRN
  Administered 2015-08-20: 100 mL via INTRAVENOUS

## 2015-08-20 MED ORDER — FENTANYL CITRATE (PF) 100 MCG/2ML IJ SOLN
50.0000 ug | Freq: Once | INTRAMUSCULAR | Status: AC
  Administered 2015-08-20: 50 ug via INTRAVENOUS
  Filled 2015-08-20: qty 2

## 2015-08-20 MED ORDER — LIDOCAINE-PRILOCAINE 2.5-2.5 % EX CREA
1.0000 "application " | TOPICAL_CREAM | CUTANEOUS | Status: DC
  Filled 2015-08-20: qty 5

## 2015-08-20 MED ORDER — MIDODRINE HCL 5 MG PO TABS
10.0000 mg | ORAL_TABLET | ORAL | Status: DC
  Administered 2015-08-20: 10 mg via ORAL
  Filled 2015-08-20: qty 2

## 2015-08-20 MED ORDER — SODIUM CHLORIDE 0.9 % IV SOLN: 100.0000 mL | INTRAVENOUS | Status: DC | PRN

## 2015-08-20 MED ORDER — LIDOCAINE HCL (PF) 1 % IJ SOLN: 5.0000 mL | INTRAMUSCULAR | Status: DC | PRN

## 2015-08-20 MED ORDER — METOCLOPRAMIDE HCL 10 MG PO TABS
5.0000 mg | ORAL_TABLET | Freq: Three times a day (TID) | ORAL | Status: AC
  Administered 2015-08-20: 5 mg via ORAL
  Administered 2015-08-21: 09:00:00 via ORAL
  Administered 2015-08-21 (×2): 5 mg via ORAL
  Filled 2015-08-20 (×4): qty 1

## 2015-08-20 MED ORDER — VANCOMYCIN HCL IN DEXTROSE 1-5 GM/200ML-% IV SOLN
1000.0000 mg | Freq: Once | INTRAVENOUS | Status: AC
  Administered 2015-08-20: 1000 mg via INTRAVENOUS
  Filled 2015-08-20: qty 200

## 2015-08-20 MED ORDER — HYDROMORPHONE HCL 1 MG/ML IJ SOLN
1.0000 mg | INTRAMUSCULAR | Status: DC | PRN
  Administered 2015-08-20 (×3): 1 mg via INTRAVENOUS
  Filled 2015-08-20 (×5): qty 1

## 2015-08-20 MED ORDER — SUCRALFATE 1 GM/10ML PO SUSP
1.0000 g | Freq: Four times a day (QID) | ORAL | Status: DC
  Administered 2015-08-20 (×2): 1 g via ORAL
  Filled 2015-08-20 (×2): qty 10

## 2015-08-20 MED ORDER — ONDANSETRON HCL 4 MG/2ML IJ SOLN
4.0000 mg | Freq: Once | INTRAMUSCULAR | Status: AC
  Administered 2015-08-20: 4 mg via INTRAVENOUS
  Filled 2015-08-20: qty 2

## 2015-08-20 MED ORDER — RIFAXIMIN 550 MG PO TABS
550.0000 mg | ORAL_TABLET | Freq: Two times a day (BID) | ORAL | Status: DC
  Administered 2015-08-21 – 2015-08-22 (×4): 550 mg via ORAL
  Filled 2015-08-20 (×5): qty 1

## 2015-08-20 MED ORDER — DARBEPOETIN ALFA 60 MCG/0.3ML IJ SOSY
60.0000 ug | PREFILLED_SYRINGE | INTRAMUSCULAR | Status: DC
Start: 2015-08-22 — End: 2015-08-22
  Filled 2015-08-20: qty 0.3

## 2015-08-20 MED ORDER — PANTOPRAZOLE SODIUM 40 MG PO TBEC: 40.0000 mg | DELAYED_RELEASE_TABLET | Freq: Every day | ORAL | Status: DC | PRN

## 2015-08-20 MED ORDER — DIATRIZOATE MEGLUMINE & SODIUM 66-10 % PO SOLN
ORAL | Status: AC
  Administered 2015-08-20: 15 mL
  Filled 2015-08-20: qty 30

## 2015-08-20 MED ORDER — PIPERACILLIN-TAZOBACTAM 3.375 G IVPB 30 MIN
3.3750 g | Freq: Once | INTRAVENOUS | Status: AC
  Administered 2015-08-20: 3.375 g via INTRAVENOUS
  Filled 2015-08-20: qty 50

## 2015-08-20 MED ORDER — DEXTROSE 5 % IV SOLN
1.0000 g | INTRAVENOUS | Status: DC
  Administered 2015-08-20 – 2015-08-21 (×2): 1 g via INTRAVENOUS
  Filled 2015-08-20 (×4): qty 10

## 2015-08-20 MED ORDER — LACTULOSE 10 GM/15ML PO SOLN
15.0000 g | Freq: Every day | ORAL | Status: DC
  Administered 2015-08-20: 15 g via ORAL
  Administered 2015-08-21: 10:00:00 via ORAL
  Filled 2015-08-20 (×3): qty 30

## 2015-08-20 MED ORDER — LIDOCAINE-PRILOCAINE 2.5-2.5 % EX CREA
1.0000 "application " | TOPICAL_CREAM | CUTANEOUS | Status: DC | PRN
  Administered 2015-08-21: 1 via TOPICAL

## 2015-08-20 MED ORDER — CALCIUM ACETATE (PHOS BINDER) 667 MG PO CAPS
667.0000 mg | ORAL_CAPSULE | Freq: Three times a day (TID) | ORAL | Status: DC
  Administered 2015-08-21 – 2015-08-22 (×3): 667 mg via ORAL
  Filled 2015-08-20 (×4): qty 1

## 2015-08-20 MED ORDER — RENA-VITE PO TABS
1.0000 | ORAL_TABLET | Freq: Every day | ORAL | Status: DC
  Administered 2015-08-21 (×2): 1 via ORAL
  Filled 2015-08-20 (×2): qty 1

## 2015-08-20 MED ORDER — PENTAFLUOROPROP-TETRAFLUOROETH EX AERO: 1.0000 "application " | INHALATION_SPRAY | CUTANEOUS | Status: DC | PRN

## 2015-08-20 NOTE — Telephone Encounter (Signed)
error 

## 2015-08-20 NOTE — Telephone Encounter (Signed)
Tried to call pt and not accepting calls at this time.

## 2015-08-20 NOTE — Progress Notes (Signed)
Dr. Verlon Au notified via text page of patient's arrival to room 307.

## 2015-08-20 NOTE — ED Provider Notes (Signed)
CSN: [id number]     Arrival date & time 08/20/15  0405 History   First MD Initiated Contact with Patient 08/20/15 [phone]   Chief Complaint  Patient presents with  . Abdominal Pain     (Consider location/radiation/quality/duration/timing/severity/associated sxs/prior Treatment) HPI patient has end-stage renal disease and gets dialysis on Tuesday (today), Thursday, and Saturday. She states she normally goes about 11:30. She goes to [facility] in [place]. She states she missed Saturday because of the snowstorm. Patient also has a history of [name] and undergoes paracentesis. She had paracentesis done December 21 and January 6. She is here complaining of lower abdominal pain. She states she had something similar in September at that time she was placed on Ceftin and it got better. However it started coming back sometimes in the past week. She feels like it started after the last paracentesis. She called her GI office and it appears that they prescribed Ceftin for her again on December 28 however she states it is not helping. She finished the antibiotics January 7. She denies fever, nausea, or vomiting. She denies dyspnea on exertion. She states after her paracentesis done the last time she felt like her abdomen did not get smaller. She denies any fever, or vomiting but has had mild nausea and decreased appetite. She states sitting down and laying down or walking makes the abdominal pain hurt more. She also states she still makes urine and she's having some urgency. Her husband states she is not confused. She is taking her lactulose.  PCP Dr Izola Price GI Dr Gala Romney Nephrology Dr Lowanda Foster  Past Medical History  Diagnosis Date  . GERD (gastroesophageal reflux disease)   . Cirrhosis ([place]) 10/05/13    Liver bx 11/23/13 (delayed initially due to patient refusal). c/w steatohepatitis  . Folate deficiency 09/2013  . Anasarca 10/10/2013  . Hematemesis/vomiting blood 02/24/2014  . Acute blood loss anemia 02/25/2014    Status  post transfusion  . Macrocytosis 02/28/2014  . Bleeding esophageal varices ([place]) 02/28/2014    s/p banding  . Acute renal failure ([place]) 09/2013    Pre-renal- resolved  . C. difficile colitis 04/19/2014  . Anxiety   . Depression   . Thrombocytopenia ([place])     Hypercellular bone marrow; abundant megakaryocytes per 08/27/2014; s/p bone marrow bx  . Cirrhosis of liver with ascites ([place])   . SBP (spontaneous bacterial peritonitis) ([place]) 11/10/2013  . Bipolar disorder ([place]) 12/04/2013    2007-SEEN IN ED FOR INVOLUNTARY COMMITMENT, UDS POS FOR AMPHETAMINES/OPIATES   . PNA (pneumonia) 10/13/2013  . Chronic hypotension   . Gastroesophageal junction ulcer 09/17/2014  . ESRD (end stage renal disease) on dialysis ([place]) 08/2014   Past Surgical History  Procedure Laterality Date  . None    . Paracentesis  Feb 2015    1180 fluid, negative fluid analysis.   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 Paracentesis  10/2013  . Colonoscopy N/A 12/19/2013    SLF:NO OBVIOUS SOURCE FOR ANEMIA IDETIFIED/ONE COLON POLYP REMOVED/Small internal hemorrhoids  . Esophagogastroduodenoscopy N/A 02/11/2014    Dr. Rourk:Esophageal varices with bleeding stigmata-status post esophageal band ligation therapy. Portal gastropathy  . Esophagogastroduodenoscopy N/A 07/04/2014    RMR: Persiting grade 2 esophageal varicies with bleeding stigmata status post band ligation. Significantly congested gastric mucosa iwith changes constistant with protal gastropathy.   . Esophageal banding  07/04/2014    Procedure: ESOPHAGEAL BANDING;  Surgeon:  Daneil Dolin, MD;  Location: [facility];  Service: Endoscopy;;  . Esophagogastroduodenoscopy (egd) with propofol N/A 07/24/2014    SLF:  1. 2 columns grade 2-3 varices- 2 Bands applied.  2.  Moderate gastropathy 3. Duodenal Diverticula  . Esophageal banding N/A  07/24/2014    Procedure: ESOPHAGEAL BANDING (2 bands applied);  Surgeon: Danie Binder, MD;  Location: [facility] OR[facility];  Service: Endoscopy;  Laterality: N/A;  . Central venous catheter insertion Right   . Esophagogastroduodenoscopy N/A 09/16/2014    Rehman: Single short column of varix proximal to GE junction not large enough to be banded. Two amall ulcers at the GEJ felt to be source of GI Bleeding but no active bleeding but no actibe bleeding noted. No therapy rendered. Portal gastropathy NO evidence of peptic ulcer diease or gastric varices.   . Esophagogastroduodenoscopy (egd) with propofol N/A 10/26/2014    RMR: 2 columns of grade 2 esophageal varices without obvious bleeding stigmata status post band ligation to complet obliteration of remaining varices.   . Av fistula placement Right 11/16/2014    Procedure: Right arm Creation of arteriovenous fistula;  Surgeon: Angelia Mould, MD;  Location: [facility] OR;  Service: Vascular;  Laterality: Right;  . Esophagogastroduodenoscopy N/A 12/14/2014    SLF: Grade ! esophageal varices. 2. Moderate Portal Gastropathy  . Esophagogastroduodenoscopy N/A 03/27/2015    Procedure: ESOPHAGOGASTRODUODENOSCOPY (EGD);  Surgeon: Danie Binder, MD;  [facility]: [facility];  Service: Endoscopy;  Laterality: N/A;  1045am - moved to 817 @ 11:30  . Esophagogastroduodenoscopy N/A 06/05/2015    Procedure: ESOPHAGOGASTRODUODENOSCOPY (EGD);  Surgeon: Clarene Essex, MD;  Location: [facility] ENDOSCOPY;  Service: Endoscopy;  Laterality: N/A;  . Esophagogastroduodenoscopy N/A 07/23/2015    SLF:1. Grade 1 esophageal varices 2. Moderate portal hypertensive gastropathy 3. MILd non-erosive gastritis.   . Esophageal banding N/A 07/23/2015    Procedure: ESOPHAGEAL BANDING;  Surgeon: Danie Binder, MD;  Location: [facility];  Service: Endoscopy;  Laterality: N/A;   Family History  Problem Relation Age of Onset  . Heart disease Mother   . Colon cancer Neg Hx   . Liver disease Neg Hx    Social  History  Substance Use Topics  . Smoking status: Former Smoker -- 0.25 packs/day for 20 years    Types: Cigarettes    Quit date: 11/05/2008  . Smokeless tobacco: Never Used  . Alcohol Use: No   Lives with spouse  OB History    Gravida Para Term Preterm AB TAB SAB Ectopic Multiple Living   1 1 1       1      Review of Systems  All other systems reviewed and are negative.     Allergies  Lasix; Latex; and Morphine and related  Home Medications   Prior to Admission medications   Medication Sig Start Date End Date Taking? Authorizing Provider  calcium acetate (PHOSLO) 667 MG capsule Take 1 capsule (667 mg total) by mouth 3 (three) times daily with meals. 06/09/15   Cherene Altes, MD  cefUROXime (CEFTIN) 250 MG tablet Take 1 tablet (250 mg total) by mouth 2 (two) times daily with a meal. 08/07/15   Mahala Menghini, PA-C  Darbepoetin Alfa (ARANESP) 60 MCG/0.3ML SOSY injection Inject 0.3 mLs (60 mcg total) into the vein every Thursday with hemodialysis. 06/09/15   Cherene Altes, MD  lactulose (CHRONULAC) 10 GM/15ML solution TAKE 3 TEASPOONSFUL (15ML) BY MOUTH TWICE DAILY 07/22/15   Mahala Menghini, PA-C  lidocaine (XYLOCAINE) 2 % solution TAKE TWO TEASPOONSFUL (10ML) BY MOUTH BEFORE MEALS AND AT BEDTIME TO PREVENT CHEST PAIN WHILE EATING 08/19/15   Carlis Stable, NP  lidocaine-prilocaine (EMLA) cream Apply 1 application topically See admin instructions. Applied prior to port access 05/03/15   Historical Provider, MD  LORazepam (ATIVAN) 1 MG tablet Take 0.5-1 tablets (0.5-1 mg total) by mouth 2 (two) times daily as needed (FOR BACK SPASM OR ANXIETY). FOR BACK SPASM OR ANXIETY 07/10/15   Danie Binder, MD  midodrine (PROAMATINE) 10 MG tablet Take 1 tablet (10 mg total) by mouth daily. *Takes only on dialysis days on Tuesdays, Thursdays, and Saturdays (Sometimes takes treatments on Friday instead of Saturday)**Medication to keep your blood pressure from falling. 06/09/15   Cherene Altes,  MD  multivitamin (RENA-VIT) TABS tablet Take 1 tablet by mouth at bedtime. 06/09/15   Cherene Altes, MD  Oxycodone HCl 10 MG TABS 1 PO TID AS NEEDED FOR PAIN 08/07/15   Mahala Menghini, PA-C  pantoprazole (PROTONIX) 40 MG tablet Take 1 tablet (40 mg total) by mouth 2 (two) times daily. Patient taking differently: Take 40 mg by mouth daily as needed (for acid reflux/GERD).  09/19/14   Kathie Dike, MD  rifaximin (XIFAXAN) 550 MG TABS tablet Take 1 tablet (550 mg total) by mouth 2 (two) times daily. 06/09/15   Cherene Altes, MD  sucralfate (CARAFATE) 1 GM/10ML suspension Take 10 mLs (1 g total) by mouth 4 (four) times daily. 07/10/15   Danie Binder, MD   BP 119/64 mmHg  Pulse 101  Temp(Src) 98 F (36.7 C) (Oral)  Resp 18  Ht 5\' 2"  (1.575 m)  Wt 130 lb (58.968 kg)  BMI 23.77 kg/m2  SpO2 100%  LMP 09/19/2013  Vital signs normal except borderline tachycardia  Physical Exam  Constitutional: She is oriented to person, place, and time.  Non-toxic appearance. She does not appear ill. No distress.  thin chronically ill appearing  HENT:  Head: Normocephalic and atraumatic.  Right Ear: External ear normal.  Left Ear: External ear normal.  Nose: Nose normal. No mucosal edema or rhinorrhea.  Mouth/Throat: Oropharynx is clear and moist and mucous membranes are normal. No dental abscesses or uvula swelling.  Eyes: Conjunctivae and EOM are normal. Pupils are equal, round, and reactive to light.  Neck: Normal range of motion and full passive range of motion without pain. Neck supple.  Cardiovascular: Normal rate, regular rhythm and normal heart sounds.  Exam reveals no gallop and no friction rub.   No murmur heard. Pulmonary/Chest: Effort normal and breath sounds normal. No respiratory distress. She has no wheezes. She has no rhonchi. She has no rales. She exhibits no tenderness and no crepitus.  Abdominal: Soft. Normal appearance and bowel sounds are normal. She exhibits no distension.  There is tenderness. There is no rebound and no guarding.  Patient has a small umbilical hernia which she states is smaller than it was a week or so ago. It is soft and reducible however it is tender to palpation. She is noted to have diffuse redness of her lower abdominal wall with warmth and induration of the left lateral abdominal wall. The puncture wound is seen in her left abdominal wall from her most recent paracentesis. There is no purulent drainage.  Musculoskeletal: Normal range of motion. She exhibits no edema or tenderness.  Moves all extremities well.   Neurological: She is alert and oriented to person, place, and time. She has  normal strength. No cranial nerve deficit.  Skin: Skin is warm, dry and intact. No rash noted. No erythema. There is pallor.  Patient is noted to have large petechial/bruised areas on her forearms.  Psychiatric: She has a normal mood and affect. Her speech is normal and behavior is normal. Her mood appears not anxious.  Nursing note and vitals reviewed.      ED Course  Procedures (including critical care time)  Medications  vancomycin (VANCOCIN) IVPB 1000 mg/200 mL premix (not administered)  piperacillin-tazobactam (ZOSYN) IVPB 3.375 g (3.375 g Intravenous New Bag/Given 08/20/15 0630)  sodium chloride 0.9 % bolus 500 mL (0 mLs Intravenous Stopped 08/20/15 0540)  fentaNYL (SUBLIMAZE) injection 50 mcg (50 mcg Intravenous Given 08/20/15 0516)  ondansetron (ZOFRAN) injection 4 mg (4 mg Intravenous Given 08/20/15 0516)  diatrizoate meglumine-sodium (GASTROGRAFIN) 66-10 % solution (15 mLs  Given 08/20/15 0459)  iohexol (OMNIPAQUE) 300 MG/ML solution 100 mL (100 mLs Intravenous Contrast Given 08/20/15 0553)    Patient had IV inserted. She was given a 500 mL bolus of fluid. She was given fentanyl and Zofran for her pain and nausea. CT scan of the abdomen and pelvis was ordered. She may have a cellulitis of her abdominal wall. On review of the peritoneal fluid she had  done on the sixth it appeared normal.  06:50 Dr Marin Comment, hospitalist, admit to him, will have day hospitalist do H&P, I will contact Dr Lowanda Foster about her dialysis needed today.  06:59 Dr Lowanda Foster, nephrology, states patient actually only comes to dialysis about twice a week, he will see in the hospital.   Culture peritoneal fluid 1/6 negative Cell Count Peritoneal Fluid 1/6 appearance yellow and clear, white blood cell count 105, neutrophil percent for, lymphocyte percent 37, monocyte/macro percent 59    Labs Review Results for orders placed or performed during the hospital encounter of 08/20/15  Culture, blood (routine x 2)  Result Value Ref Range   Specimen Description BLOOD LEFT WRIST    Special Requests BOTTLES DRAWN AEROBIC AND ANAEROBIC 10 CC EACH    Culture PENDING    Report Status PENDING   Culture, blood (routine x 2)  Result Value Ref Range   Specimen Description BLOOD LEFT HAND    Special Requests BOTTLES DRAWN AEROBIC AND ANAEROBIC 10 CC EACH    Culture PENDING    Report Status PENDING   Comprehensive metabolic panel  Result Value Ref Range   Sodium 134 (L) 135 - 145 mmol/L   Potassium 3.9 3.5 - 5.1 mmol/L   Chloride 97 (L) 101 - 111 mmol/L   CO2 21 (L) 22 - 32 mmol/L   Glucose, Bld 102 (H) 65 - 99 mg/dL   BUN 98 (H) 6 - 20 mg/dL   Creatinine, Ser 5.95 (H) 0.44 - 1.00 mg/dL   Calcium 9.2 8.9 - 10.3 mg/dL   Total Protein 6.9 6.5 - 8.1 g/dL   Albumin 3.4 (L) 3.5 - 5.0 g/dL   AST 79 (H) 15 - 41 U/L   ALT 37 14 - 54 U/L   Alkaline Phosphatase 116 38 - 126 U/L   Total Bilirubin 2.1 (H) 0.3 - 1.2 mg/dL   GFR calc non Af Amer 8 (L) >60 mL/min   GFR calc Af Amer 9 (L) >60 mL/min   Anion gap 16 (H) 5 - 15  CBC with Differential  Result Value Ref Range   WBC 4.9 4.0 - 10.5 K/uL   RBC 3.88 3.87 - 5.11 MIL/uL   Hemoglobin 12.0  12.0 - 15.0 g/dL   HCT 37.2 36.0 - 46.0 %   MCV 95.9 78.0 - 100.0 fL   MCH 30.9 26.0 - 34.0 pg   MCHC 32.3 30.0 - 36.0 g/dL   RDW 16.6 (H) 11.5  - 15.5 %   Platelets 88 (L) 150 - 400 K/uL   Neutrophils Relative % 70 %   Neutro Abs 3.4 1.7 - 7.7 K/uL   Lymphocytes Relative 19 %   Lymphs Abs 0.9 0.7 - 4.0 K/uL   Monocytes Relative 7 %   Monocytes Absolute 0.4 0.1 - 1.0 K/uL   Eosinophils Relative 3 %   Eosinophils Absolute 0.1 0.0 - 0.7 K/uL   Basophils Relative 1 %   Basophils Absolute 0.1 0.0 - 0.1 K/uL   RBC Morphology ELLIPTOCYTES    Smear Review PLATELETS APPEAR DECREASED   Lipase, blood  Result Value Ref Range   Lipase 47 11 - 51 U/L  Ammonia  Result Value Ref Range   Ammonia 69 (H) 9 - 35 umol/L  Urinalysis, Routine w reflex microscopic  Result Value Ref Range   Color, Urine YELLOW YELLOW   APPearance CLEAR CLEAR   Specific Gravity, Urine >1.030 (H) 1.005 - 1.030   pH 5.5 5.0 - 8.0   Glucose, UA NEGATIVE NEGATIVE mg/dL   Hgb urine dipstick TRACE (A) NEGATIVE   Bilirubin Urine SMALL (A) NEGATIVE   Ketones, ur NEGATIVE NEGATIVE mg/dL   Protein, ur TRACE (A) NEGATIVE mg/dL   Nitrite NEGATIVE NEGATIVE   Leukocytes, UA NEGATIVE NEGATIVE  Urine rapid drug screen (hosp performed)  Result Value Ref Range   Opiates POSITIVE (A) NONE DETECTED   Cocaine NONE DETECTED NONE DETECTED   Benzodiazepines POSITIVE (A) NONE DETECTED   Amphetamines NONE DETECTED NONE DETECTED   Tetrahydrocannabinol NONE DETECTED NONE DETECTED   Barbiturates NONE DETECTED NONE DETECTED  Urine microscopic-add on  Result Value Ref Range   Squamous Epithelial / LPF NONE SEEN NONE SEEN   WBC, UA NONE SEEN 0 - 5 WBC/hpf   RBC / HPF 0-5 0 - 5 RBC/hpf   Bacteria, UA FEW (A) NONE SEEN    Laboratory interpretation all normal except chronic renal failure, metabolic acidosis consistent with patient needing dialysis however her potassium is normal, known thrombocytopenia, minor elevation of her LFTs which is stable, elevation of her ammonia level however it has been much higher    Imaging Review Ct Abdomen Pelvis W Contrast  08/20/2015  CLINICAL  DATA:  40 year old female with lower abdominal pain. EXAM: CT ABDOMEN AND PELVIS WITH CONTRAST TECHNIQUE: Multidetector CT imaging of the abdomen and pelvis was performed using the standard protocol following bolus administration of intravenous contrast. CONTRAST:  177mL OMNIPAQUE IOHEXOL 300 MG/ML  SOLN COMPARISON:  Abdominal ultrasound dated 08/16/2015 FINDINGS: The visualized lung bases are clear. No intra-abdominal free air. Small ascites, diffuse mesenteric and subcutaneous soft tissue edema, and anasarca. Morphologic changes of cirrhosis. There is a 1.9 cm linear hypodensity extending from the inferior surface of the right lobe of the liver which may represent an age indeterminate laceration or a fissure. No active hemorrhage identified. There multiple stones within the gallbladder. The pancreas appears unremarkable. Top-normal spleen size measuring up to 13 cm. The adrenal glands appear unremarkable. There is no hydronephrosis. The visualized ureters, the urinary bladder, the uterus, and the ovaries appear grossly unremarkable. There is moderate stool throughout the colon. No evidence of bowel obstruction or inflammation. The the appendix is not visualized with certainty. A linear structure  extending from the cecum (series 2, image 50 and coronal image 46) may represent a normal appendix. Mild aortoiliac atherosclerotic disease. The origins of the celiac axis, SMA, IMA as well as the origins of the renal arteries are patent. The splenic vein is patent. Prominent subcutaneous collateral veins noted in the right lower quadrant and pericecal region. No portal venous gas identified. There is no adenopathy. There is a small fat containing umbilical hernia. There is extension of ascitic fluid into the umbilical hernia. There is diffuse subcutaneous soft tissue edema and anasarca. There is mild degenerative changes of the spine. No acute fracture. 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 IMPRESSION: Cirrhosis with small ascites, diffuse mesenteric edema and anasarca. Cholelithiasis. Linear hypodensity in the inferior surface of the right lobe of the liver may represent an age indeterminate laceration or a fissure. No evidence of active hemorrhage. Constipation.  No evidence of bowel obstruction or inflammation. Electronically Signed   By: Anner Crete M.D.   On: 08/20/2015 06:19      US Paracentesis  08/16/2015  CLINICAL DATA:  Symptomatic ascites.  Cirrhosis from NASHCOMPLICATIONS: None. FINDINGS: A total of approximately 4.8 L of straw colored ascitic fluid was removed. A fluid sample was reserved for laboratory analysis if requested. IMPRESSION: Successful ultrasound guided paracentesis yielding 4.8 L of ascites. Electronically Signed   By: Monte Fantasia M.D.   On: 08/16/2015 16:14   US Paracentesis  07/31/2015  CLINICAL DATA:  Cirrhosis, ascites  COMPLICATIONS: None. FINDINGS: A total of approximately 4500 mL of ascitic fluid was removed. A fluid sample of 180 mL was sent for laboratory analysis. IMPRESSION: Successful ultrasound guided paracentesis yielding 4500 mL of ascites. Electronically Signed   By: Lavonia Dana M.D.   On: 07/31/2015 16:48    I have personally reviewed and evaluated these images and lab results as part of my medical decision-making.    MDM   patient presents with cellulitis in her abdominal wall that has not responded to oral outpatient treatment. She was started on IV antibiotics.    Final diagnoses:  Abdominal wall cellulitis  ESRD needing dialysis [facility])   Plan admission   Rolland Porter, MD, Barbette Or, MD 08/20/15 0700

## 2015-08-20 NOTE — Telephone Encounter (Signed)
[name] is aware that pt will probably need to wait til Mon for the para and also that she will be placed on NPO tonight after midnight and they will assess her tomorrow morning to see if she can have the EGD.  FYI to [employer].

## 2015-08-20 NOTE — Telephone Encounter (Signed)
Wanda [name] also called the office to remind [place] that pt has an appt for a procedure tomorrow with Dr. Oneida Alar.  He didn't know if pt will still have this done since she is an inpatient

## 2015-08-20 NOTE — H&P (Addendum)
[facility] History and Physical  Wanda [name] [id number] DOB: 04/01/1976 DOA: 08/20/2015  39y/o 08/07/15 through ? [id number] ESRD t/th/sat since early 2016-[facility] [place] NASH confriemd by liver biopsy 2015-Previously on liver transplant list [facility] GERD Esophageal varices grade 1, GAVE last endo 07/23/15 Chr hypotension  [nl SBP ~ 100] Substance abuse Chronic pain, home regimen of oxycodone 10 3 times a day  Admitted from [facility] ED   Recently seen in GI office-she had paracentesis 08/16/15 and about 4 L was taken off. She usually gets dialysis twice a week on Tuesday and Thursday as her husband works on Saturday and she cannot present for dialysis on Saturday Fluid taken off by dialysis on 08/15/15 =   2.5 L  Note that patient was recently treated in the gastroenterology office on 08/07/15 visit for abdominal pain. Note the patient also has chronic pain and takes oxycodone 10 by mouth 3 times a day when necessary She was treated 08/07/15 for potential abdominal cellulitis as she had a previous episode 04/2015  fluid from last paracentesis 08/16/15 was negative for signs or symptoms of infection   Denies fever Chills Nausea Vomiting Dark stool Tarry stool Unilateral weakness Tremor Blurred vision Double vision   emergency room workup  BUN/creatinine 98/5.9, potassium 3.9  AST /ALT 79/37, ammonia 69, bilirubin 2.1, platelet 88  CT scan abdomen and pelvis showed cirrhosis small ascites diffuse mesenteric edema and anasarca cholelithiasis and the near hypodensity in the S of the right lumbar ? Indeterminate laceration of seizure no evidence of bowel obstruction or inflammation    Past Medical History  Diagnosis Date  . GERD (gastroesophageal reflux disease)   . Cirrhosis ([place]) 10/05/13    Liver bx 11/23/13 (delayed initially due to patient refusal). c/w steatohepatitis  . Folate deficiency 09/2013  . Anasarca 10/10/2013  . Hematemesis/vomiting blood 02/24/2014  . Acute blood  loss anemia 02/25/2014    Status post transfusion  . Macrocytosis 02/28/2014  . Bleeding esophageal varices ([place]) 02/28/2014    s/p banding  . Acute renal failure ([place]) 09/2013    Pre-renal- resolved  . C. difficile colitis 04/19/2014  . Anxiety   . Depression   . Thrombocytopenia ([place])     Hypercellular bone marrow; abundant megakaryocytes per 08/27/2014; s/p bone marrow bx  . Cirrhosis of liver with ascites ([place])   . SBP (spontaneous bacterial peritonitis) ([place]) 11/10/2013  . Bipolar disorder ([place]) 12/04/2013    2007-SEEN IN ED FOR INVOLUNTARY COMMITMENT, UDS POS FOR AMPHETAMINES/OPIATES   . PNA (pneumonia) 10/13/2013  . Chronic hypotension   . Gastroesophageal junction ulcer 09/17/2014  . ESRD (end stage renal disease) on dialysis ([place]) 08/2014   Past Surgical History  Procedure Laterality Date  . None    . Paracentesis  Feb 2015    1180 fluid, negative fluid analysis.   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 Paracentesis  10/2013  . Colonoscopy N/A 12/19/2013    SLF:NO OBVIOUS SOURCE FOR ANEMIA IDETIFIED/ONE COLON POLYP REMOVED/Small internal hemorrhoids  . Esophagogastroduodenoscopy N/A 02/11/2014    Dr. Rourk:Esophageal varices with bleeding stigmata-status post esophageal band ligation therapy. Portal gastropathy  . Esophagogastroduodenoscopy N/A 07/04/2014    RMR: Persiting grade 2 esophageal varicies with bleeding stigmata status post band ligation. Significantly congested gastric mucosa iwith changes constistant with protal gastropathy.   . Esophageal banding  07/04/2014    Procedure: ESOPHAGEAL BANDING;  Surgeon: Herbie Baltimore  Hilton Cork, MD;  Location: [facility];  Service: Endoscopy;;  . Esophagogastroduodenoscopy (egd) with propofol N/A 07/24/2014    SLF:  1. 2 columns grade 2-3 varices- 2 Bands applied.  2.  Moderate gastropathy 3. Duodenal Diverticula   . Esophageal banding N/A 07/24/2014    Procedure: ESOPHAGEAL BANDING (2 bands applied);  Surgeon: Danie Binder, MD;  Location: [facility] ORS;  Service: Endoscopy;  Laterality: N/A;  . Central venous catheter insertion Right   . Esophagogastroduodenoscopy N/A 09/16/2014    Rehman: Single short column of varix proximal to GE junction not large enough to be banded. Two amall ulcers at the GEJ felt to be source of GI Bleeding but no active bleeding but no actibe bleeding noted. No therapy rendered. Portal gastropathy NO evidence of peptic ulcer diease or gastric varices.   . Esophagogastroduodenoscopy (egd) with propofol N/A 10/26/2014    RMR: 2 columns of grade 2 esophageal varices without obvious bleeding stigmata status post band ligation to complet obliteration of remaining varices.   . Av fistula placement Right 11/16/2014    Procedure: Right arm Creation of arteriovenous fistula;  Surgeon: Angelia Mould, MD;  Location: [facility] OR;  Service: Vascular;  Laterality: Right;  . Esophagogastroduodenoscopy N/A 12/14/2014    SLF: Grade ! esophageal varices. 2. Moderate Portal Gastropathy  . Esophagogastroduodenoscopy N/A 03/27/2015    Procedure: ESOPHAGOGASTRODUODENOSCOPY (EGD);  Surgeon: Danie Binder, MD;  Location: [facility];  Service: Endoscopy;  Laterality: N/A;  1045am - moved to 817 @ 11:30  . Esophagogastroduodenoscopy N/A 06/05/2015    Procedure: ESOPHAGOGASTRODUODENOSCOPY (EGD);  Surgeon: Clarene Essex, MD;  Location: [facility] ENDOSCOPY;  Service: Endoscopy;  Laterality: N/A;  . Esophagogastroduodenoscopy N/A 07/23/2015    SLF:1. Grade 1 esophageal varices 2. Moderate portal hypertensive gastropathy 3. MILd non-erosive gastritis.   . Esophageal banding N/A 07/23/2015    Procedure: ESOPHAGEAL BANDING;  Surgeon: Danie Binder, MD;  Location: [facility];  Service: Endoscopy;  Laterality: N/A;   Social History:  Social History   Social History Narrative    Allergies  Allergen Reactions  . Lasix  [Furosemide]     "doesn't work"  . Latex Itching  . Morphine And Related Hives and Itching    Family History  Problem Relation Age of Onset  . Heart disease Mother   . Colon cancer Neg Hx   . Liver disease Neg Hx     Prior to Admission medications   Medication Sig Start Date End Date Taking? Authorizing Provider  calcium acetate (PHOSLO) 667 MG capsule Take 1 capsule (667 mg total) by mouth 3 (three) times daily with meals. 06/09/15   Cherene Altes, MD  cefUROXime (CEFTIN) 250 MG tablet Take 1 tablet (250 mg total) by mouth 2 (two) times daily with a meal. 08/07/15   Mahala Menghini, PA-C  Darbepoetin Alfa (ARANESP) 60 MCG/0.3ML SOSY injection Inject 0.3 mLs (60 mcg total) into the vein every Thursday with hemodialysis. 06/09/15   Cherene Altes, MD  lactulose (CHRONULAC) 10 GM/15ML solution TAKE 3 TEASPOONSFUL (15ML) BY MOUTH TWICE DAILY 07/22/15   Mahala Menghini, PA-C  lidocaine (XYLOCAINE) 2 % solution TAKE TWO TEASPOONSFUL (10ML) BY MOUTH BEFORE MEALS AND AT BEDTIME TO PREVENT CHEST PAIN WHILE EATING 08/19/15   Carlis Stable, NP  lidocaine-prilocaine (EMLA) cream Apply 1 application topically See admin instructions. Applied prior to port access 05/03/15   Historical Provider, MD  LORazepam (ATIVAN) 1 MG tablet Take 0.5-1 tablets (0.5-1 mg total)  by mouth 2 (two) times daily as needed (FOR BACK SPASM OR ANXIETY). FOR BACK SPASM OR ANXIETY 07/10/15   Danie Binder, MD  midodrine (PROAMATINE) 10 MG tablet Take 1 tablet (10 mg total) by mouth daily. *Takes only on dialysis days on Tuesdays, Thursdays, and Saturdays (Sometimes takes treatments on Friday instead of Saturday)**Medication to keep your blood pressure from falling. 06/09/15   Cherene Altes, MD  multivitamin (RENA-VIT) TABS tablet Take 1 tablet by mouth at bedtime. 06/09/15   Cherene Altes, MD  Oxycodone HCl 10 MG TABS 1 PO TID AS NEEDED FOR PAIN 08/07/15   Mahala Menghini, PA-C  pantoprazole (PROTONIX) 40 MG tablet Take 1  tablet (40 mg total) by mouth 2 (two) times daily. Patient taking differently: Take 40 mg by mouth daily as needed (for acid reflux/GERD).  09/19/14   Kathie Dike, MD  rifaximin (XIFAXAN) 550 MG TABS tablet Take 1 tablet (550 mg total) by mouth 2 (two) times daily. 06/09/15   Cherene Altes, MD  sucralfate (CARAFATE) 1 GM/10ML suspension Take 10 mLs (1 g total) by mouth 4 (four) times daily. 07/10/15   Danie Binder, MD   Physical Exam: Filed Vitals:   08/20/15 0629 08/20/15 0630 08/20/15 0700 08/20/15 0730  BP: 119/64 102/66 103/57 95/60  Pulse: 101 94 98 96  Temp:      TempSrc:      Resp: 18 16 16    Height:      Weight:      SpO2: 100% 100% 98% 98%    EOMI NCAT flat affect for verbal output  Mildly icteric JVD elevated Carotids normal S1-S2 no murmur rub or gallop  Abdomen distended ascites noted slightly red around site of paracentesis performed on 1/6 however rest abdomen is not grossly red nor inflamed  Tenderness seems out of proportion to exam     Labs on Admission:  Basic Metabolic Panel:  Recent Labs Lab 08/20/15 0450  NA 134*  K 3.9  CL 97*  CO2 21*  GLUCOSE 102*  BUN 98*  CREATININE 5.95*  CALCIUM 9.2   Liver Function Tests:  Recent Labs Lab 08/20/15 0450  AST 79*  ALT 37  ALKPHOS 116  BILITOT 2.1*  PROT 6.9  ALBUMIN 3.4*    Recent Labs Lab 08/20/15 0450  LIPASE 47    Recent Labs Lab 08/20/15 0450  AMMONIA 69*   CBC:  Recent Labs Lab 08/20/15 0450  WBC 4.9  NEUTROABS 3.4  HGB 12.0  HCT 37.2  MCV 95.9  PLT 88*   Cardiac Enzymes: No results for input(s): CKTOTAL, CKMB, CKMBINDEX, TROPONINI in the last 168 hours.  BNP (last 3 results) No results for input(s): BNP in the last 8760 hours.  ProBNP (last 3 results) No results for input(s): PROBNP in the last 8760 hours.  CBG: No results for input(s): GLUCAP in the last 168 hours.  Radiological Exams on Admission: Ct Abdomen Pelvis W Contrast  08/20/2015  CLINICAL  DATA:  40 year old female with lower abdominal pain. EXAM: CT ABDOMEN AND PELVIS WITH CONTRAST TECHNIQUE: Multidetector CT imaging of the abdomen and pelvis was performed using the standard protocol following bolus administration of intravenous contrast. CONTRAST:  116mL OMNIPAQUE IOHEXOL 300 MG/ML  SOLN COMPARISON:  Abdominal ultrasound dated 08/16/2015 FINDINGS: The visualized lung bases are clear. No intra-abdominal free air. Small ascites, diffuse mesenteric and subcutaneous soft tissue edema, and anasarca. Morphologic changes of cirrhosis. There is a 1.9 cm linear hypodensity extending from the  inferior surface of the right lobe of the liver which may represent an age indeterminate laceration or a fissure. No active hemorrhage identified. There multiple stones within the gallbladder. The pancreas appears unremarkable. Top-normal spleen size measuring up to 13 cm. The adrenal glands appear unremarkable. There is no hydronephrosis. The visualized ureters, the urinary bladder, the uterus, and the ovaries appear grossly unremarkable. There is moderate stool throughout the colon. No evidence of bowel obstruction or inflammation. The the appendix is not visualized with certainty. A linear structure extending from the cecum (series 2, image 50 and coronal image 46) may represent a normal appendix. Mild aortoiliac atherosclerotic disease. The origins of the celiac axis, SMA, IMA as well as the origins of the renal arteries are patent. The splenic vein is patent. Prominent subcutaneous collateral veins noted in the right lower quadrant and pericecal region. No portal venous gas identified. There is no adenopathy. There is a small fat containing umbilical hernia. There is extension of ascitic fluid into the umbilical hernia. There is diffuse subcutaneous soft tissue edema and anasarca. There is mild degenerative changes of the spine. No acute fracture. 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 IMPRESSION: Cirrhosis with small ascites, diffuse mesenteric edema and anasarca. Cholelithiasis. Linear hypodensity in the inferior surface of the right lobe of the liver may represent an age indeterminate laceration or a fissure. No evidence of active hemorrhage. Constipation.  No evidence of bowel obstruction or inflammation. Electronically Signed   By: Anner Crete M.D.   On: 08/20/2015 06:19    EKG: Independently reviewed. none  Assessment/Plan Possible abdominal pannus infection? -cover with vancomycin and Zosyn today -Repeat CBC plus differential in a.m.  -Low threshold to change quickly over 2 oral antibiotic and treat for minimal amount of time 3-4 days going forward and follow-up as an outpatient   End-stage renal disease on dialysis Tuesday and Thursday-not able to make dialysis on Saturdays  -Nephrology input appreciated   Cirrhosis of liver -Follows with gastroenterology and also with [facility] -Further planning per them, I will consult gastroenterology here  -It looks like she missed her last appointment in June 2016 and probably is not a candidate further for transplant ? -unclear if can afford Xifaxan per outpatient per prior notes -Continue lactulose 15 mils twice a day   Chronic pain  -patient is on chronioxycodone 1 tab by mouth 3 times a day which revealed increased to 3 times a day after meals and at bedtime  -patient will also be given Dilaudid1 mg every 3 when necessary IV patient may benefit from pain physician seeing  - her sympotms seem op to her pain   Addend -d/w husband who confirms that patient ran out of pain medicine early the last time.   counseled him that patient may need coordination as an OP with a pain MD adn I do not think her pain is soley from the mild cellulitis of the abdomen    Keep on SCD's  OBS for  Abd pain D/c 1-2 days GI should see soon-have not consulted

## 2015-08-20 NOTE — Telephone Encounter (Signed)
Dr. Oneida Alar, [name] came by the office to find out if pt is still to do the EGD that was previously scheduled for tomorrow.  Will she still be in the hospital or does she need to cancel? Please advise!

## 2015-08-20 NOTE — ED Notes (Signed)
Pt c/o lower abdominal pain since December

## 2015-08-20 NOTE — Progress Notes (Addendum)
ADMITTED WITH CELLULITIS OF THE ABDOMINAL WALL. EGD JAN 11 SCHEDULED AS OUTPATIENT TO BAND VARICES.  WILL MAKE PT NPO AFTER MN EXCEPT MEDS. WILL ASSESS IN AM IF PT IS FIT FOR CONSCIOUS SEDATION. REGLAN QAC AND HS PRIOR TO EGD. LAST EGD STOMACH FULL OF LIQUID.

## 2015-08-20 NOTE — Consult Note (Signed)
Reason for Consult: End-stage renal disease Referring Physician: Dr. Ladona Horns Wanda [name] is an 40 y.o. female.  HPI: She is a patient with liver cirrhosis, bipolar disorder, end-stage renal disease on maintenance hemodialysis presently came to the emergency room with complaints of abdominal pain and some nausea. Patient denies any fever but she has some chills. Overall she states that she is not feeling good. She has also slight difficulty breathing. She says she is feeling better this morning.  Past Medical History  Diagnosis Date  . GERD (gastroesophageal reflux disease)   . Cirrhosis ([place]) 10/05/13    Liver bx 11/23/13 (delayed initially due to patient refusal). c/w steatohepatitis  . Folate deficiency 09/2013  . Anasarca 10/10/2013  . Hematemesis/vomiting blood 02/24/2014  . Acute blood loss anemia 02/25/2014    Status post transfusion  . Macrocytosis 02/28/2014  . Bleeding esophageal varices ([place]) 02/28/2014    s/p banding  . Acute renal failure ([place]) 09/2013    Pre-renal- resolved  . C. difficile colitis 04/19/2014  . Anxiety   . Depression   . Thrombocytopenia ([place])     Hypercellular bone marrow; abundant megakaryocytes per 08/27/2014; s/p bone marrow bx  . Cirrhosis of liver with ascites ([place])   . SBP (spontaneous bacterial peritonitis) ([place]) 11/10/2013  . Bipolar disorder ([place]) 12/04/2013    2007-SEEN IN ED FOR INVOLUNTARY COMMITMENT, UDS POS FOR AMPHETAMINES/OPIATES   . PNA (pneumonia) 10/13/2013  . Chronic hypotension   . Gastroesophageal junction ulcer 09/17/2014  . ESRD (end stage renal disease) on dialysis ([place]) 08/2014    Past Surgical History  Procedure Laterality Date  . None    . Paracentesis  Feb 2015    1180 fluid, negative fluid analysis.   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 Paracentesis  10/2013  . Colonoscopy N/A 12/19/2013     SLF:NO OBVIOUS SOURCE FOR ANEMIA IDETIFIED/ONE COLON POLYP REMOVED/Small internal hemorrhoids  . Esophagogastroduodenoscopy N/A 02/11/2014    Dr. Rourk:Esophageal varices with bleeding stigmata-status post esophageal band ligation therapy. Portal gastropathy  . Esophagogastroduodenoscopy N/A 07/04/2014    RMR: Persiting grade 2 esophageal varicies with bleeding stigmata status post band ligation. Significantly congested gastric mucosa iwith changes constistant with protal gastropathy.   . Esophageal banding  07/04/2014    Procedure: ESOPHAGEAL BANDING;  Surgeon: Daneil Dolin, MD;  Location: [facility];  Service: Endoscopy;;  . Esophagogastroduodenoscopy (egd) with propofol N/A 07/24/2014    SLF:  1. 2 columns grade 2-3 varices- 2 Bands applied.  2.  Moderate gastropathy 3. Duodenal Diverticula  . Esophageal banding N/A 07/24/2014    Procedure: ESOPHAGEAL BANDING (2 bands applied);  Surgeon: Danie Binder, MD;  Location: [facility] OR[facility];  Service: Endoscopy;  Laterality: N/A;  . Central venous catheter insertion Right   . Esophagogastroduodenoscopy N/A 09/16/2014    Rehman: Single short column of varix proximal to GE junction not large enough to be banded. Two amall ulcers at the GEJ felt to be source of GI Bleeding but no active bleeding but no actibe bleeding noted. No therapy rendered. Portal gastropathy NO evidence of peptic ulcer diease or gastric varices.   . Esophagogastroduodenoscopy (egd) with propofol N/A 10/26/2014    RMR: 2 columns of grade 2 esophageal varices without obvious bleeding stigmata status post band ligation to complet obliteration of remaining varices.   . Av fistula placement Right 11/16/2014    Procedure:  Right arm Creation of arteriovenous fistula;  Surgeon: Angelia Mould, MD;  Location: [facility];  Service: Vascular;  Laterality: Right;  . Esophagogastroduodenoscopy N/A 12/14/2014    SLF: Grade ! esophageal varices. 2. Moderate Portal Gastropathy  . Esophagogastroduodenoscopy  N/A 03/27/2015    Procedure: ESOPHAGOGASTRODUODENOSCOPY (EGD);  Surgeon: Danie Binder, MD;  Location: [facility];  Service: Endoscopy;  Laterality: N/A;  1045am - moved to 817 @ 11:30  . Esophagogastroduodenoscopy N/A 06/05/2015    Procedure: ESOPHAGOGASTRODUODENOSCOPY (EGD);  Surgeon: Clarene Essex, MD;  Location: [facility] ENDOSCOPY;  Service: Endoscopy;  Laterality: N/A;  . Esophagogastroduodenoscopy N/A 07/23/2015    SLF:1. Grade 1 esophageal varices 2. Moderate portal hypertensive gastropathy 3. MILd non-erosive gastritis.   . Esophageal banding N/A 07/23/2015    Procedure: ESOPHAGEAL BANDING;  Surgeon: Danie Binder, MD;  Location: [facility];  Service: Endoscopy;  Laterality: N/A;    Family History  Problem Relation Age of Onset  . Heart disease Mother   . Colon cancer Neg Hx   . Liver disease Neg Hx     Social History:  reports that she quit smoking about 6 years ago. Her smoking use included Cigarettes. She has a 5 pack-year smoking history. She has never used smokeless tobacco. She reports that she does not drink alcohol or use illicit drugs.  Allergies:  Allergies  Allergen Reactions  . Lasix [Furosemide]     "doesn't work"  . Latex Itching  . Morphine And Related Hives and Itching    Medications: I have reviewed the patient's current medications.  Results for orders placed or performed during the hospital encounter of 08/20/15 (from the past 48 hour(s))  Culture, blood (routine x 2)     Status: None (Preliminary result)   Collection Time: 08/20/15  4:35 AM  Result Value Ref Range   Specimen Description BLOOD LEFT WRIST    Special Requests BOTTLES DRAWN AEROBIC AND ANAEROBIC 10 CC EACH    Culture PENDING    Report Status PENDING   Comprehensive metabolic panel     Status: Abnormal   Collection Time: 08/20/15  4:50 AM  Result Value Ref Range   Sodium 134 (L) 135 - 145 mmol/L   Potassium 3.9 3.5 - 5.1 mmol/L   Chloride 97 (L) 101 - 111 mmol/L   CO2 21 (L) 22 - 32  mmol/L   Glucose, Bld 102 (H) 65 - 99 mg/dL   BUN 98 (H) 6 - 20 mg/dL   Creatinine, Ser 5.95 (H) 0.44 - 1.00 mg/dL   Calcium 9.2 8.9 - 10.3 mg/dL   Total Protein 6.9 6.5 - 8.1 g/dL   Albumin 3.4 (L) 3.5 - 5.0 g/dL   AST 79 (H) 15 - 41 U/L   ALT 37 14 - 54 U/L   Alkaline Phosphatase 116 38 - 126 U/L   Total Bilirubin 2.1 (H) 0.3 - 1.2 mg/dL   GFR calc non Af Amer 8 (L) >60 mL/min   GFR calc Af Amer 9 (L) >60 mL/min    Comment: (NOTE) The eGFR has been calculated using the CKD EPI equation. This calculation has not been validated in all clinical situations. eGFR's persistently <60 mL/min signify possible Chronic Kidney Disease.    Anion gap 16 (H) 5 - 15  CBC with Differential     Status: Abnormal   Collection Time: 08/20/15  4:50 AM  Result Value Ref Range   WBC 4.9 4.0 - 10.5 K/uL   RBC 3.88 3.87 - 5.11  MIL/uL   Hemoglobin 12.0 12.0 - 15.0 g/dL   HCT 37.2 36.0 - 46.0 %   MCV 95.9 78.0 - 100.0 fL   MCH 30.9 26.0 - 34.0 pg   MCHC 32.3 30.0 - 36.0 g/dL   RDW 16.6 (H) 11.5 - 15.5 %   Platelets 88 (L) 150 - 400 K/uL    Comment: REPEATED TO VERIFY SPECIMEN CHECKED FOR CLOTS    Neutrophils Relative % 70 %   Neutro Abs 3.4 1.7 - 7.7 K/uL   Lymphocytes Relative 19 %   Lymphs Abs 0.9 0.7 - 4.0 K/uL   Monocytes Relative 7 %   Monocytes Absolute 0.4 0.1 - 1.0 K/uL   Eosinophils Relative 3 %   Eosinophils Absolute 0.1 0.0 - 0.7 K/uL   Basophils Relative 1 %   Basophils Absolute 0.1 0.0 - 0.1 K/uL   RBC Morphology ELLIPTOCYTES    Smear Review PLATELETS APPEAR DECREASED   Lipase, blood     Status: None   Collection Time: 08/20/15  4:50 AM  Result Value Ref Range   Lipase 47 11 - 51 U/L  Ammonia     Status: Abnormal   Collection Time: 08/20/15  4:50 AM  Result Value Ref Range   Ammonia 69 (H) 9 - 35 umol/L  Culture, blood (routine x 2)     Status: None (Preliminary result)   Collection Time: 08/20/15  5:00 AM  Result Value Ref Range   Specimen Description BLOOD LEFT HAND     Special Requests BOTTLES DRAWN AEROBIC AND ANAEROBIC 10 CC EACH    Culture PENDING    Report Status PENDING   Urinalysis, Routine w reflex microscopic     Status: Abnormal   Collection Time: 08/20/15  5:05 AM  Result Value Ref Range   Color, Urine YELLOW YELLOW   APPearance CLEAR CLEAR   Specific Gravity, Urine >1.030 (H) 1.005 - 1.030   pH 5.5 5.0 - 8.0   Glucose, UA NEGATIVE NEGATIVE mg/dL   Hgb urine dipstick TRACE (A) NEGATIVE   Bilirubin Urine SMALL (A) NEGATIVE   Ketones, ur NEGATIVE NEGATIVE mg/dL   Protein, ur TRACE (A) NEGATIVE mg/dL   Nitrite NEGATIVE NEGATIVE   Leukocytes, UA NEGATIVE NEGATIVE  Urine rapid drug screen (hosp performed)     Status: Abnormal   Collection Time: 08/20/15  5:05 AM  Result Value Ref Range   Opiates POSITIVE (A) NONE DETECTED   Cocaine NONE DETECTED NONE DETECTED   Benzodiazepines POSITIVE (A) NONE DETECTED   Amphetamines NONE DETECTED NONE DETECTED   Tetrahydrocannabinol NONE DETECTED NONE DETECTED   Barbiturates NONE DETECTED NONE DETECTED    Comment:        DRUG SCREEN FOR MEDICAL PURPOSES ONLY.  IF CONFIRMATION IS NEEDED FOR ANY PURPOSE, NOTIFY LAB WITHIN 5 DAYS.        LOWEST DETECTABLE LIMITS FOR URINE DRUG SCREEN Drug Class       Cutoff (ng/mL) Amphetamine      1000 Barbiturate      200 Benzodiazepine   749 Tricyclics       449 Opiates          300 Cocaine          300 THC              50   Urine microscopic-add on     Status: Abnormal   Collection Time: 08/20/15  5:05 AM  Result Value Ref Range   Squamous Epithelial / LPF NONE SEEN NONE  SEEN   WBC, UA NONE SEEN 0 - 5 WBC/hpf   RBC / HPF 0-5 0 - 5 RBC/hpf   Bacteria, UA FEW (A) NONE SEEN    Ct Abdomen Pelvis W Contrast  08/20/2015  CLINICAL DATA:  40 year old female with lower abdominal pain. EXAM: CT ABDOMEN AND PELVIS WITH CONTRAST TECHNIQUE: Multidetector CT imaging of the abdomen and pelvis was performed using the standard protocol following bolus administration of  intravenous contrast. CONTRAST:  120m OMNIPAQUE IOHEXOL 300 MG/ML  SOLN COMPARISON:  Abdominal ultrasound dated 08/16/2015 FINDINGS: The visualized lung bases are clear. No intra-abdominal free air. Small ascites, diffuse mesenteric and subcutaneous soft tissue edema, and anasarca. Morphologic changes of cirrhosis. There is a 1.9 cm linear hypodensity extending from the inferior surface of the right lobe of the liver which may represent an age indeterminate laceration or a fissure. No active hemorrhage identified. There multiple stones within the gallbladder. The pancreas appears unremarkable. Top-normal spleen size measuring up to 13 cm. The adrenal glands appear unremarkable. There is no hydronephrosis. The visualized ureters, the urinary bladder, the uterus, and the ovaries appear grossly unremarkable. There is moderate stool throughout the colon. No evidence of bowel obstruction or inflammation. The the appendix is not visualized with certainty. A linear structure extending from the cecum (series 2, image 50 and coronal image 46) may represent a normal appendix. Mild aortoiliac atherosclerotic disease. The origins of the celiac axis, SMA, IMA as well as the origins of the renal arteries are patent. The splenic vein is patent. Prominent subcutaneous collateral veins noted in the right lower quadrant and pericecal region. No portal venous gas identified. There is no adenopathy. There is a small fat containing umbilical hernia. There is extension of ascitic fluid into the umbilical hernia. There is diffuse subcutaneous soft tissue edema and anasarca. There is mild degenerative changes of the spine. No acute fracture. 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 IMPRESSION: Cirrhosis with small ascites, diffuse mesenteric edema and anasarca. Cholelithiasis. Linear hypodensity in the inferior surface of the right lobe of the liver may represent an age indeterminate  laceration or a fissure. No evidence of active hemorrhage. Constipation.  No evidence of bowel obstruction or inflammation. Electronically Signed   By: AAnner CreteM.D.   On: 08/20/2015 06:19    Review of Systems  Constitutional: Positive for chills. Negative for fever.  Respiratory: Positive for shortness of breath.   Cardiovascular: Positive for leg swelling. Negative for palpitations and orthopnea.  Gastrointestinal: Positive for abdominal pain. Negative for nausea and vomiting.  Neurological: Positive for weakness.   Blood pressure 95/60, pulse 96, temperature 98 F (36.7 C), temperature source Oral, resp. rate 16, height 5' 2"  (1.575 m), weight 130 lb (58.968 kg), last menstrual period 09/19/2013, SpO2 98 %. Physical Exam  Constitutional: No distress.  Eyes: No scleral icterus.  Cardiovascular: Normal rate and regular rhythm.  Exam reveals gallop.   Murmur heard. Respiratory: She has wheezes. She has no rales.  GI: She exhibits distension. There is tenderness. There is guarding.  Musculoskeletal: She exhibits no edema.    Assessment/Plan: Problem #1 end-stage renal disease: She is status post hemodialysis on Thursday. She missed her dialysis on Saturday. Presently her potassium is good and patient is asymptomatic. Problem #2 abdominal pain: Etiology not clear. Patient however complains of pain on and off even from before. Presently patient is a febrile and her white blood cell count is normal. Problem #3 history of ascites: She  is status post paracentesis on Friday. They were able to remove about 4 and half liters. Problem #4 anemia: Her hemoglobin is above our target goal Problem #5. Metabolic bone disease calcium is range Problem #6 hypotension: Her blood pressure is low normal Problem #7 history of bipolar disorder. Plan: We'll make arrangements for patient to get dialysis today. 2] we will remove about a liter. Romberg pressure tolerates. 3] will hold heparin and  Epogen.  Baeleigh Devincent S 08/20/2015, 8:14 AM

## 2015-08-20 NOTE — Telephone Encounter (Signed)
noted 

## 2015-08-20 NOTE — Telephone Encounter (Addendum)
PLEASE CALL PT. SHE WILL LIKELY NEED TO WAIT UNTIL MON TO HAVE PARACENTESIS IF POSSIBLE SO HER ABX WILL BE ALMOST COMPLETE.

## 2015-08-20 NOTE — Telephone Encounter (Signed)
Routing to [employer]

## 2015-08-20 NOTE — Progress Notes (Signed)
ANTIBIOTIC CONSULT NOTE - INITIAL  Pharmacy Consult for Ceftriaxone Indication: Cellulitis  Allergies  Allergen Reactions  . Lasix [Furosemide]     "doesn't work"  . Latex Itching  . Morphine And Related Hives and Itching    Patient Measurements: Height: 5\' 2"  (157.5 cm) Weight: 130 lb (58.968 kg) IBW/kg (Calculated) : 50.1  Vital Signs: Temp: 98 F (36.7 C) (01/10 0420) Temp Source: Oral (01/10 0420) BP: 95/60 mmHg (01/10 0730) Pulse Rate: 96 (01/10 0730) Intake/Output from previous day:   Intake/Output from this shift: Total I/O In: 240 [P.O.:240] Out: -   Labs:  Recent Labs  08/20/15 0450  WBC 4.9  HGB 12.0  PLT 88*  CREATININE 5.95*   Estimated Creatinine Clearance: 9.8 mL/min (by C-G formula based on Cr of 5.95). No results for input(s): VANCOTROUGH, VANCOPEAK, VANCORANDOM, GENTTROUGH, GENTPEAK, GENTRANDOM, TOBRATROUGH, TOBRAPEAK, TOBRARND, AMIKACINPEAK, AMIKACINTROU, AMIKACIN in the last 72 hours.   Microbiology: Recent Results (from the past 720 hour(s))  Culture, body fluid-bottle     Status: None   Collection Time: 07/31/15  2:00 PM  Result Value Ref Range Status   Specimen Description FLUID ASCITIC COLLECTED BY DOCTOR  Final   Special Requests BOTTLES DRAWN AEROBIC AND ANAEROBIC 10CC EACH  Final   Culture NO GROWTH 5 DAYS  Final   Report Status 08/05/2015 FINAL  Final  Gram stain     Status: None   Collection Time: 07/31/15  2:00 PM  Result Value Ref Range Status   Specimen Description FLUID ASCITIC COLLECTED BY DOCTOR  Final   Special Requests NONE  Final   Gram Stain   Final    WBC PRESENT, PREDOMINANTLY MONONUCLEAR NO ORGANISMS SEEN Performed at [facility]    Report Status 07/31/2015 FINAL  Final  Culture, body fluid-bottle     Status: None (Preliminary result)   Collection Time: 08/16/15  3:11 PM  Result Value Ref Range Status   Specimen Description ABDOMEN  Final   Special Requests BOTTLES DRAWN AEROBIC AND ANAEROBIC 5CC EACH   Final   Culture NO GROWTH 3 DAYS  Final   Report Status PENDING  Incomplete  Culture, blood (routine x 2)     Status: None (Preliminary result)   Collection Time: 08/20/15  4:35 AM  Result Value Ref Range Status   Specimen Description BLOOD LEFT WRIST  Final   Special Requests BOTTLES DRAWN AEROBIC AND ANAEROBIC 10 CC EACH  Final   Culture PENDING  Incomplete   Report Status PENDING  Incomplete  Culture, blood (routine x 2)     Status: None (Preliminary result)   Collection Time: 08/20/15  5:00 AM  Result Value Ref Range Status   Specimen Description BLOOD LEFT HAND  Final   Special Requests BOTTLES DRAWN AEROBIC AND ANAEROBIC 10 CC EACH  Final   Culture PENDING  Incomplete   Report Status PENDING  Incomplete    Medical History: Past Medical History  Diagnosis Date  . GERD (gastroesophageal reflux disease)   . Cirrhosis ([place]) 10/05/13    Liver bx 11/23/13 (delayed initially due to patient refusal). c/w steatohepatitis  . Folate deficiency 09/2013  . Anasarca 10/10/2013  . Hematemesis/vomiting blood 02/24/2014  . Acute blood loss anemia 02/25/2014    Status post transfusion  . Macrocytosis 02/28/2014  . Bleeding esophageal varices ([place]) 02/28/2014    s/p banding  . Acute renal failure ([place]) 09/2013    Pre-renal- resolved  . C. difficile colitis 04/19/2014  . Anxiety   . Depression   .  Thrombocytopenia ([place])     Hypercellular bone marrow; abundant megakaryocytes per 08/27/2014; s/p bone marrow bx  . Cirrhosis of liver with ascites ([place])   . SBP (spontaneous bacterial peritonitis) ([place]) 11/10/2013  . Bipolar disorder ([place]) 12/04/2013    2007-SEEN IN ED FOR INVOLUNTARY COMMITMENT, UDS POS FOR AMPHETAMINES/OPIATES   . PNA (pneumonia) 10/13/2013  . Chronic hypotension   . Gastroesophageal junction ulcer 09/17/2014  . ESRD (end stage renal disease) on dialysis ([place]) 08/2014    Medications:  Prescriptions prior to admission  Medication Sig Dispense Refill Last Dose  . calcium acetate  (PHOSLO) 667 MG capsule Take 1 capsule (667 mg total) by mouth 3 (three) times daily with meals. 90 capsule 0 08/19/2015 at Unknown time  . Darbepoetin Alfa (ARANESP) 60 MCG/0.3ML SOSY injection Inject 0.3 mLs (60 mcg total) into the vein every Thursday with hemodialysis. 4.2 mL  Past Week at Unknown time  . lactulose (CHRONULAC) 10 GM/15ML solution TAKE 3 TEASPOONSFUL (15ML) BY MOUTH TWICE DAILY 946 mL 5 08/19/2015 at Unknown time  . lidocaine (XYLOCAINE) 2 % solution TAKE TWO TEASPOONSFUL (10ML) BY MOUTH BEFORE MEALS AND AT BEDTIME TO PREVENT CHEST PAIN WHILE EATING 500 mL 0 Past Week at Unknown time  . lidocaine-prilocaine (EMLA) cream Apply 1 application topically See admin instructions. Applied prior to port access  6 Past Week at Unknown time  . LORazepam (ATIVAN) 1 MG tablet Take 0.5-1 tablets (0.5-1 mg total) by mouth 2 (two) times daily as needed (FOR BACK SPASM OR ANXIETY). FOR BACK SPASM OR ANXIETY 45 tablet 1 08/19/2015 at Unknown time  . midodrine (PROAMATINE) 10 MG tablet Take 1 tablet (10 mg total) by mouth daily. *Takes only on dialysis days on Tuesdays, Thursdays, and Saturdays (Sometimes takes treatments on Friday instead of Saturday)**Medication to keep your blood pressure from falling. 60 tablet 3 08/20/2015 at Unknown time  . Oxycodone HCl 10 MG TABS 1 PO TID AS NEEDED FOR PAIN 90 tablet 0 08/19/2015 at Unknown time  . pantoprazole (PROTONIX) 40 MG tablet Take 1 tablet (40 mg total) by mouth 2 (two) times daily. (Patient taking differently: Take 40 mg by mouth daily as needed (for acid reflux/GERD). ) 60 tablet 2 Past Month at Unknown time  . sucralfate (CARAFATE) 1 GM/10ML suspension Take 10 mLs (1 g total) by mouth 4 (four) times daily. 420 mL 1 08/19/2015 at Unknown time  . cefUROXime (CEFTIN) 250 MG tablet Take 1 tablet (250 mg total) by mouth 2 (two) times daily with a meal. (Patient not taking: Reported on 08/20/2015) 20 tablet 0 Completed Course at Unknown time  . multivitamin (RENA-VIT)  TABS tablet Take 1 tablet by mouth at bedtime. (Patient not taking: Reported on 08/20/2015) 30 tablet 0 Not Taking at Unknown time  . rifaximin (XIFAXAN) 550 MG TABS tablet Take 1 tablet (550 mg total) by mouth 2 (two) times daily. (Patient not taking: Reported on 08/20/2015) 60 tablet 0 Not Taking at Unknown time   Assessment: 40 yo female admitted for abdominal pain. On HD and recently received paracentesis. She was treated recently 08/07/15 for potential abdominal cellulitis and received ceftin. She is not currently taking. Received 1 dose of vancomycin and 1 dose of zosyn in ED. In patient tx to be continued with Rocephin.   Goal of Therapy:  Resolution of infection  Plan:  Rocephin 1gm IV q24h Follow up culture results Monitor labs and V/S  Isac Sarna, BS Vena [place], BCPS Clinical Pharmacist Pager [phone]  Cristy Friedlander  08/20/2015,12:37 PM

## 2015-08-20 NOTE — Progress Notes (Addendum)
Patient requesting to shower.  Dr. Verlon Au called and gave phone order to allow patient to shower.  Dr. Verlon Au also gave order for Rocephin per pharmacy dosing.  Pharmacist notified.

## 2015-08-20 NOTE — Telephone Encounter (Signed)
[name] returned call and said pt is at [facility]. She had to go about 3:00 Am this morning for abdominal pain. They did a CT and are treating her for abdominal wall cellulitis with antibiotics. Sending FYI to Neil Crouch, PA and Dr. Oneida Alar.

## 2015-08-20 NOTE — Telephone Encounter (Signed)
PLEASE CALL PT'S FAMILY. WE WILL MAKE PT NPO AFTER [place] EXCEPT MEDS. WILL ASSESS IN AM IF SHE IS ABLE TO HAVE THE EGD.

## 2015-08-20 NOTE — Telephone Encounter (Signed)
LMOM for [name] that pt's Rx for Lidocaine was sent in yesterday.

## 2015-08-21 ENCOUNTER — Ambulatory Visit (HOSPITAL_COMMUNITY): Admission: RE | Admit: 2015-08-21 | Payer: Medicare Other | Source: Ambulatory Visit | Admitting: Gastroenterology

## 2015-08-21 ENCOUNTER — Encounter (HOSPITAL_COMMUNITY): Admission: RE | Payer: Self-pay | Source: Ambulatory Visit

## 2015-08-21 DIAGNOSIS — N186 End stage renal disease: Secondary | ICD-10-CM

## 2015-08-21 DIAGNOSIS — I9589 Other hypotension: Secondary | ICD-10-CM

## 2015-08-21 DIAGNOSIS — D696 Thrombocytopenia, unspecified: Secondary | ICD-10-CM

## 2015-08-21 DIAGNOSIS — R188 Other ascites: Secondary | ICD-10-CM

## 2015-08-21 DIAGNOSIS — K7460 Unspecified cirrhosis of liver: Secondary | ICD-10-CM

## 2015-08-21 DIAGNOSIS — L03311 Cellulitis of abdominal wall: Principal | ICD-10-CM

## 2015-08-21 LAB — COMPREHENSIVE METABOLIC PANEL
ALK PHOS: 102 U/L (ref 38–126)
ALT: 29 U/L (ref 14–54)
AST: 60 U/L — AB (ref 15–41)
Albumin: 2.8 g/dL — ABNORMAL LOW (ref 3.5–5.0)
Anion gap: 10 (ref 5–15)
BILIRUBIN TOTAL: 1.4 mg/dL — AB (ref 0.3–1.2)
BUN: 35 mg/dL — AB (ref 6–20)
CHLORIDE: 102 mmol/L (ref 101–111)
CO2: 27 mmol/L (ref 22–32)
CREATININE: 3.15 mg/dL — AB (ref 0.44–1.00)
Calcium: 8.3 mg/dL — ABNORMAL LOW (ref 8.9–10.3)
GFR calc Af Amer: 20 mL/min — ABNORMAL LOW (ref >60)
GFR, EST NON AFRICAN AMERICAN: 17 mL/min — AB (ref >60)
Glucose, Bld: 80 mg/dL (ref 65–99)
Potassium: 3.6 mmol/L (ref 3.5–5.1)
Sodium: 139 mmol/L (ref 135–145)
TOTAL PROTEIN: 5.9 g/dL — AB (ref 6.5–8.1)

## 2015-08-21 LAB — CBC
HCT: 33.6 % — ABNORMAL LOW (ref 36.0–46.0)
Hemoglobin: 10.8 g/dL — ABNORMAL LOW (ref 12.0–15.0)
MCH: 30.9 pg (ref 26.0–34.0)
MCHC: 32.1 g/dL (ref 30.0–36.0)
MCV: 96.3 fL (ref 78.0–100.0)
PLATELETS: 79 10*3/uL — AB (ref 150–400)
RBC: 3.49 MIL/uL — ABNORMAL LOW (ref 3.87–5.11)
RDW: 16.7 % — AB (ref 11.5–15.5)
WBC: 3.8 10*3/uL — AB (ref 4.0–10.5)

## 2015-08-21 LAB — PROTIME-INR
INR: 1.24 (ref 0.00–1.49)
Prothrombin Time: 15.7 seconds — ABNORMAL HIGH (ref 11.6–15.2)

## 2015-08-21 SURGERY — EGD (ESOPHAGOGASTRODUODENOSCOPY)
Anesthesia: Moderate Sedation

## 2015-08-21 MED ORDER — SODIUM CHLORIDE 0.9 % IV BOLUS (SEPSIS)
250.0000 mL | Freq: Once | INTRAVENOUS | Status: AC
  Administered 2015-08-22: 250 mL via INTRAVENOUS

## 2015-08-21 NOTE — Telephone Encounter (Signed)
Noted  

## 2015-08-21 NOTE — Progress Notes (Signed)
[facility] PROGRESS NOTE  DELORIES ZIEMER [name] DOB: October 08, 1975 DOA: 08/20/2015 PCP: [name], MD  Assessment/Plan: 1. Abdominal wall cellulitis, clinically she has evidence of erythema and tenderness over left lower abdominal wall. Continue IV abx. WBC 3.8, afebrile. 2. ESRD, typically receives dialysis Tuesday, Thursday, and Saturday. Attempted HD 1/11 however unsuccessful due to hypotension. Creatinine improved at 3.15 today, appreciate nephrology input.  3. Thrombocytopenia, chronic. Related to liver disease. 4. 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 Patient was scheduled for upper endoscopy on 1/11 for variceal surveillence. Patient has refused to have this procedure as an inpatient and is requesting to have it as an outpatient. Since there is no urgent need for it at this time, this is agreeable.  5. Hx of ascites. Recent paracentesis 1/6 with removal of 4L.  6. Chronic hypotension, likely related to hepatorenal disease. Continue Midodrine 7. Chronic pain, continue pain management.    Code Status: Full DVT prophylaxis: Lovenox Family Communication: No family at bedside.  Disposition Plan: Anticipate discharge in 1-2 days.    Consultants:  Nephrology  GI  Procedures:  Attempted HD 1/11 but unsuccessful  Antibiotics:  Rocephin 1/10>>  Vancomycin 1/10>>  HPI/Subjective: Complains of abdominal pain. Denies any nausea, vomiting, diarrhea, or constipation.   Objective: Filed Vitals:   08/21/15 0036 08/21/15 0548  BP: 88/43 91/49  Pulse: 81 80  Temp: 97.7 F (36.5 C) 98.4 F (36.9 C)  Resp: 16 18    Intake/Output Summary (Last 24 hours) at 08/21/15 0835 Last data filed at 08/21/15 0020  Gross per 24 hour  Intake    960 ml  Output   -300 ml  Net   1260 ml   Filed Weights   08/20/15 0420 08/21/15 0548  Weight: 58.968 kg (130 lb) 55.112 kg (121 lb 8 oz)    Exam:   General: NAD, looks  comfortable  Cardiovascular: RRR, S1, S2   Respiratory: clear bilaterally, No wheezing, rales or rhonchi  Abdomen: erythema, warm, and tenderness over left lower abdominal wall. No evidence of candidal infection.   Musculoskeletal: 1 to 2+ edema b/l  Data Reviewed: Basic Metabolic Panel:  Recent Labs Lab 08/20/15 0450 08/21/15 0552  NA 134* 139  K 3.9 3.6  CL 97* 102  CO2 21* 27  GLUCOSE 102* 80  BUN 98* 35*  CREATININE 5.95* 3.15*  CALCIUM 9.2 8.3*   Liver Function Tests:  Recent Labs Lab 08/20/15 0450 08/21/15 0552  AST 79* 60*  ALT 37 29  ALKPHOS 116 102  BILITOT 2.1* 1.4*  PROT 6.9 5.9*  ALBUMIN 3.4* 2.8*    Recent Labs Lab 08/20/15 0450  LIPASE 47    Recent Labs Lab 08/20/15 0450  AMMONIA 69*   CBC:  Recent Labs Lab 08/20/15 0450 08/21/15 0552  WBC 4.9 3.8*  NEUTROABS 3.4  --   HGB 12.0 10.8*  HCT 37.2 33.6*  MCV 95.9 96.3  PLT 88* 79*    Recent Results (from the past 240 hour(s))  Culture, body fluid-bottle     Status: None (Preliminary result)   Collection Time: 08/16/15  3:11 PM  Result Value Ref Range Status   Specimen Description ABDOMEN  Final   Special Requests BOTTLES DRAWN AEROBIC AND ANAEROBIC 5CC EACH  Final   Culture NO GROWTH 3 DAYS  Final   Report Status PENDING  Incomplete  Culture, blood (routine x 2)     Status: None (Preliminary result)   Collection Time: 08/20/15  4:35 AM  Result Value Ref Range Status   Specimen Description BLOOD LEFT WRIST  Final   Special Requests BOTTLES DRAWN AEROBIC AND ANAEROBIC 10 CC EACH  Final   Culture PENDING  Incomplete   Report Status PENDING  Incomplete  Culture, blood (routine x 2)     Status: None (Preliminary result)   Collection Time: 08/20/15  5:00 AM  Result Value Ref Range Status   Specimen Description BLOOD LEFT HAND  Final   Special Requests BOTTLES DRAWN AEROBIC AND ANAEROBIC 10 CC EACH  Final   Culture PENDING  Incomplete   Report Status PENDING  Incomplete      Studies: Ct Abdomen Pelvis W Contrast  08/20/2015  CLINICAL DATA:  40 year old female with lower abdominal pain. EXAM: CT ABDOMEN AND PELVIS WITH CONTRAST TECHNIQUE: Multidetector CT imaging of the abdomen and pelvis was performed using the standard protocol following bolus administration of intravenous contrast. CONTRAST:  167mL OMNIPAQUE IOHEXOL 300 MG/ML  SOLN COMPARISON:  Abdominal ultrasound dated 08/16/2015 FINDINGS: The visualized lung bases are clear. No intra-abdominal free air. Small ascites, diffuse mesenteric and subcutaneous soft tissue edema, and anasarca. Morphologic changes of cirrhosis. There is a 1.9 cm linear hypodensity extending from the inferior surface of the right lobe of the liver which may represent an age indeterminate laceration or a fissure. No active hemorrhage identified. There multiple stones within the gallbladder. The pancreas appears unremarkable. Top-normal spleen size measuring up to 13 cm. The adrenal glands appear unremarkable. There is no hydronephrosis. The visualized ureters, the urinary bladder, the uterus, and the ovaries appear grossly unremarkable. There is moderate stool throughout the colon. No evidence of bowel obstruction or inflammation. The the appendix is not visualized with certainty. A linear structure extending from the cecum (series 2, image 50 and coronal image 46) may represent a normal appendix. Mild aortoiliac atherosclerotic disease. The origins of the celiac axis, SMA, IMA as well as the origins of the renal arteries are patent. The splenic vein is patent. Prominent subcutaneous collateral veins noted in the right lower quadrant and pericecal region. No portal venous gas identified. There is no adenopathy. There is a small fat containing umbilical hernia. There is extension of ascitic fluid into the umbilical hernia. There is diffuse subcutaneous soft tissue edema and anasarca. There is mild degenerative changes of the spine. No acute fracture.  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 IMPRESSION: Cirrhosis with small ascites, diffuse mesenteric edema and anasarca. Cholelithiasis. Linear hypodensity in the inferior surface of the right lobe of the liver may represent an age indeterminate laceration or a fissure. No evidence of active hemorrhage. Constipation.  No evidence of bowel obstruction or inflammation. Electronically Signed   By: Anner Crete M.D.   On: 08/20/2015 06:19    Scheduled Meds: . calcium acetate  667 mg Oral TID WC  . cefTRIAXone (ROCEPHIN)  IV  1 g Intravenous Q24H  . [START ON 08/22/2015] Darbepoetin Alfa  60 mcg Intravenous Q Thu-HD  . lactulose  15 g Oral Daily  . lidocaine-prilocaine  1 application Topical See admin instructions  . metoCLOPramide  5 mg Oral TID AC & HS  . midodrine  10 mg Oral Q T,Th,Sat-1800  . multivitamin  1 tablet Oral QHS  . oxyCODONE  10 mg Oral TID PC & HS  . rifaximin  550 mg Oral BID   Continuous Infusions:   Active Problems:   ESRD (end stage renal disease) ([place])   Abdominal  wall cellulitis    Time spent: 25  minutes   [employer]. MD [facility] Pager [phone]. If 7PM-7AM, please contact night-coverage at [email or web address], password [facility] 08/21/2015, 8:35 AM  LOS: 1 day      By signing my name below, I, Rosalie Doctor, attest that this documentation has been prepared under the direction and in the presence of [facility]. MD Electronically Signed: Rosalie Doctor, Scribe. 08/21/2015 1:38pm   I, Dr. Kathie Dike, personally performed the services described in this documentaiton. All medical record entries made by the scribe were at my direction and in my presence. I have reviewed the chart and agree that the record reflects my personal performance and is accurate and complete  Kathie Dike, MD, 08/21/2015 1:59 PM

## 2015-08-21 NOTE — Telephone Encounter (Signed)
Patient seen briefly today in hospital. She refuses to have EGD today stating her abdominal pain is too bad. States she cannot lay on her left side due to pain. Declined thorough exam due to pain. Will make SLF aware. I notified endo to postpone EGD.

## 2015-08-21 NOTE — Progress Notes (Signed)
Wanda [name]  MRN: [id number]  DOB/AGE: [birth year] 40 y.o.  Primary Care Physician:ROBERSON, [name], MD  Admit date: 08/20/2015  Chief Complaint:  Chief Complaint  Patient presents with  . Abdominal Pain    S-Pt presented on  08/20/2015 with  Chief Complaint  Patient presents with  . Abdominal Pain  .     Pt offers no new complaints.Pt says my pain in belly is better on left side but I still have some on  Right side.     Meds . calcium acetate  667 mg Oral TID WC  . cefTRIAXone (ROCEPHIN)  IV  1 g Intravenous Q24H  . [START ON 08/22/2015] Darbepoetin Alfa  60 mcg Intravenous Q Thu-HD  . lactulose  15 g Oral Daily  . lidocaine-prilocaine  1 application Topical See admin instructions  . metoCLOPramide  5 mg Oral TID AC & HS  . midodrine  10 mg Oral Q T,Th,Sat-1800  . multivitamin  1 tablet Oral QHS  . oxyCODONE  10 mg Oral TID PC & HS  . rifaximin  550 mg Oral BID     Physical Exam: Vital signs in last 24 hours: Temp:  [97.7 F (36.5 C)-98.4 F (36.9 C)] 98.4 F (36.9 C) (01/11 0548) Pulse Rate:  [70-90] 80 (01/11 0548) Resp:  [16-18] 18 (01/11 0548) BP: (70-97)/(35-59) 91/49 mmHg (01/11 0548) SpO2:  [98 %-100 %] 98 % (01/11 0548) Weight:  [121 lb 8 oz (55.112 kg)] 121 lb 8 oz (55.112 kg) (01/11 0548) Weight change: -8 lb 8 oz (-3.856 kg) Last BM Date: 08/18/15  Intake/Output from previous day: 01/10 0701 - 01/11 0700 In: 960 [P.O.:960] Out: -300      Physical Exam: General- pt is awake,alert, oriented to time place and person Resp- No acute REsp distress,decreased Bs at bases. CVS- S1S2 regular in rate and rhythm GIT- BS+, soft, distended+  EXT-1+ LE Edema,NO Cyanosis Access Right AVF +   Lab Results: CBC  Recent Labs  08/20/15 0450 08/21/15 0552  WBC 4.9 3.8*  HGB 12.0 10.8*  HCT 37.2 33.6*  PLT 88* 79*    BMET  Recent Labs  08/20/15 0450 08/21/15 0552  NA 134* 139  K 3.9 3.6  CL 97* 102  CO2 21* 27  GLUCOSE 102* 80  BUN 98*  35*  CREATININE 5.95* 3.15*  CALCIUM 9.2 8.3*    MICRO Recent Results (from the past 240 hour(s))  Culture, body fluid-bottle     Status: None (Preliminary result)   Collection Time: 08/16/15  3:11 PM  Result Value Ref Range Status   Specimen Description ABDOMEN  Final   Special Requests BOTTLES DRAWN AEROBIC AND ANAEROBIC 5CC EACH  Final   Culture NO GROWTH 3 DAYS  Final   Report Status PENDING  Incomplete  Culture, blood (routine x 2)     Status: None (Preliminary result)   Collection Time: 08/20/15  4:35 AM  Result Value Ref Range Status   Specimen Description BLOOD LEFT WRIST  Final   Special Requests BOTTLES DRAWN AEROBIC AND ANAEROBIC 10 CC EACH  Final   Culture PENDING  Incomplete   Report Status PENDING  Incomplete  Culture, blood (routine x 2)     Status: None (Preliminary result)   Collection Time: 08/20/15  5:00 AM  Result Value Ref Range Status   Specimen Description BLOOD LEFT HAND  Final   Special Requests BOTTLES DRAWN AEROBIC AND ANAEROBIC 10 CC EACH  Final   Culture PENDING  Incomplete  Report Status PENDING  Incomplete      Lab Results  Component Value Date   CALCIUM 8.3* 08/21/2015   CAION 1.10* 05/14/2015   PHOS 5.0* 06/08/2015       Impression: 1)Renal   ESRD on HD                 Pt is on TTS schedule                 Pt was last dialyzed yesterday                 NO need of HD today              2)CVS- Hemodynamically fragile               BP is low                On Midodrine   3)Anemia HGb  Stable   4)Liver- Cirrhosis Sec to [place] hepatitis To have EGD for banding as outpt  5)Electrolytes  Normokalemia  Hyponatremia    Sec to ESRD+ Cirrhosis     better   6)ID- admitted with abdominal wall cellulitis Primary MD following    7)Acid base Co2 at goal  8) Thrombocytopenia-Stable Primary MD following  Plan:  Will continue current care NO need of Hd today   [place] S 08/21/2015, 9:09 AM

## 2015-08-21 NOTE — Care Management Note (Signed)
Case Management Note  Patient Details  Name: JENNESSA PARMA MRN: [id number] Date of Birth: 18-May-1976  Subjective/Objective:                  Pt admitted with ESRD. Pt is from home, lives with her boyfriend and is ind with ADL's. Pt uses a walker with ambulation. Receives HD three days a week from [facility] in [place]. Pt plans to return home with self care at DC.   Action/Plan: No CM needs anticipated.   Expected Discharge Date:    08/21/2014              Expected Discharge Plan:  Home/Self Care  In-House Referral:  NA  Discharge planning Services  CM Consult  Post Acute Care Choice:  NA Choice offered to:  NA  DME Arranged:    DME Agency:     HH Arranged:    HH Agency:     Status of Service:  Completed, signed off  Medicare Important Message Given:    Date Medicare IM Given:    Medicare IM give by:    Date Additional Medicare IM Given:    Additional Medicare Important Message give by:     If discussed at [place] of Stay Meetings, dates discussed:    Additional Comments:  Sherald Barge, RN 08/21/2015, 3:04 PM

## 2015-08-21 NOTE — Procedures (Signed)
HEMODIALYSIS TREATMENT NOTE:  3.5 hour heparin-free dialysis completed via right upper arm AVF (16g/antegrade). Goal NOT met: Unable to remove any fluid due to hypotension (asymptomatic). Per HD order, ultrafiltration was interrupted for SBP<90 (entire HD session). All blood was returned and hemostasis was achieved within 15 minutes. Report called to Melody Patten, RN.  Angela Poteat, RN, CDN 

## 2015-08-21 NOTE — Telephone Encounter (Signed)
REVIEWED-NO ADDITIONAL RECOMMENDATIONS. 

## 2015-08-22 DIAGNOSIS — K7581 Nonalcoholic steatohepatitis (NASH): Secondary | ICD-10-CM

## 2015-08-22 DIAGNOSIS — G894 Chronic pain syndrome: Secondary | ICD-10-CM

## 2015-08-22 LAB — BASIC METABOLIC PANEL
Anion gap: 10 (ref 5–15)
BUN: 48 mg/dL — AB (ref 6–20)
CALCIUM: 8.2 mg/dL — AB (ref 8.9–10.3)
CO2: 26 mmol/L (ref 22–32)
Chloride: 99 mmol/L — ABNORMAL LOW (ref 101–111)
Creatinine, Ser: 4.98 mg/dL — ABNORMAL HIGH (ref 0.44–1.00)
GFR calc Af Amer: 11 mL/min — ABNORMAL LOW (ref >60)
GFR, EST NON AFRICAN AMERICAN: 10 mL/min — AB (ref >60)
GLUCOSE: 115 mg/dL — AB (ref 65–99)
Potassium: 3.6 mmol/L (ref 3.5–5.1)
Sodium: 135 mmol/L (ref 135–145)

## 2015-08-22 LAB — CBC
HEMATOCRIT: 34 % — AB (ref 36.0–46.0)
Hemoglobin: 10.7 g/dL — ABNORMAL LOW (ref 12.0–15.0)
MCH: 30.9 pg (ref 26.0–34.0)
MCHC: 31.5 g/dL (ref 30.0–36.0)
MCV: 98.3 fL (ref 78.0–100.0)
PLATELETS: 84 10*3/uL — AB (ref 150–400)
RBC: 3.46 MIL/uL — ABNORMAL LOW (ref 3.87–5.11)
RDW: 17.2 % — AB (ref 11.5–15.5)
WBC: 5.3 10*3/uL (ref 4.0–10.5)

## 2015-08-22 LAB — PATHOLOGIST SMEAR REVIEW

## 2015-08-22 MED ORDER — MIDODRINE HCL 5 MG PO TABS
10.0000 mg | ORAL_TABLET | Freq: Two times a day (BID) | ORAL | Status: DC
  Administered 2015-08-22: 10 mg via ORAL
  Filled 2015-08-22: qty 2

## 2015-08-22 MED ORDER — SODIUM CHLORIDE 0.9 % IV BOLUS (SEPSIS): 250.0000 mL | Freq: Once | INTRAVENOUS | Status: AC

## 2015-08-22 MED ORDER — CEPHALEXIN 500 MG PO CAPS: 500.0000 mg | ORAL_CAPSULE | Freq: Four times a day (QID) | ORAL | Status: DC

## 2015-08-22 NOTE — Progress Notes (Signed)
MD was notified of patient's blood pressure remaining 70/20s. A order for 250 ml bolus was ordered and administered. Patient tolerated bolus administration well, blood pressure was raised slightly. Will continue to monitor patient status.

## 2015-08-22 NOTE — Discharge Summary (Signed)
Physician Discharge Summary  Wanda [name] [name] DOB: 24-Nov-1975 DOA: 08/20/2015  PCP: [name], MD  Admit date: 08/20/2015 Discharge date: 08/22/2015  Time spent: 35 minutes  Recommendations for Outpatient Follow-up:  1. Follow up with PCP in 1-2 weeks. 2. Follow up with GI as outpatient to pursue EGD for varices surveillance   Discharge Diagnoses:  Active Problems:   Liver cirrhosis secondary to nonalcoholic steatohepatitis (NASH)   Thrombocytopenia (HCC)   Hypotension   Abdominal pain   ESRD (end stage renal disease) ([place])   Abdominal wall cellulitis   Chronic pain syndrome   Discharge Condition: Improved  Diet recommendation: Heart Healthy  Filed Weights   08/20/15 0420 08/21/15 0548 08/22/15 0441  Weight: 58.968 kg (130 lb) 55.112 kg (121 lb 8 oz) 56.7 kg (125 lb)    History of present illness:  40 y/o female with a hx of GERD, cirrhosis, anemia, and ESRD that presented with lower abdominal pain. While in the ED, labs revealed a BUN/creatinine 98/5.9, potassium 3.9, ammonia 69, bilirubin 2.1, and platelet of 88. Abdominal CT revealed cirrhosis with small ascites. She was admitted for further management.   Hospital Course:  Patient presented with abdominal wall cellulitis in her LLQ with associated erythema, warmth, and edema. She was started on IV abx for abdominal wall cellulitis with improvement. At this time, her erythema has resolved but she continues to have some warmth in the LLQ. She will be continued on oral abx upon discharge. She is afebrile with a normal WBC count.  1. ESRD, typically receives dialysis Tuesday, Thursday, and Saturday.  HD 1/11. Appreciated nephrology input. She will continue her regular dialysis upon discharge. 2. Thrombocytopenia, chronic. Related to liver disease. 3. 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 Patient was scheduled for upper endoscopy on 1/11 for variceal surveillence.  Patient has refused to have this procedure as an inpatient and is requesting to have it as an outpatient. Since there is no urgent need for it at this time, this is agreeable.  4. Hx of ascites. Recent paracentesis 1/6 with removal of 4L.  5. Chronic hypotension, likely related to hepatorenal disease. Continue Midodrine. 6. Chronic pain, continue pain management.  Consultants:  Nephrology  GI  Procedures:  HD 1/11  Discharge Exam: Filed Vitals:   08/22/15 0441 08/22/15 1520  BP: 75/35 91/34  Pulse: 85 72  Temp: 98 F (36.7 C)   Resp: 20 18     General: NAD, looks comfortable  Cardiovascular: RRR, S1, S2   Respiratory: clear bilaterally, No wheezing, rales or rhonchi  Abdomen: Improving tenderness over left lower abdominal wall. No evidence of candidal infection. Mild warmth. No erythema.   Musculoskeletal: 1+ edema b/l   Discharge Instructions   Discharge Instructions    Diet - low sodium heart healthy    Complete by:  As directed      Increase activity slowly    Complete by:  As directed           Current Discharge Medication List    START taking these medications   Details  cephALEXin (KEFLEX) 500 MG capsule Take 1 capsule (500 mg total) by mouth 4 (four) times daily. Qty: 16 capsule, Refills: 0      CONTINUE these medications which have NOT CHANGED   Details  calcium acetate (PHOSLO) 667 MG capsule Take 1 capsule (667 mg total) by mouth 3 (three) times daily with meals. Qty: 90 capsule, Refills: 0    Darbepoetin  Alfa (ARANESP) 60 MCG/0.3ML SOSY injection Inject 0.3 mLs (60 mcg total) into the vein every Thursday with hemodialysis. Qty: 4.2 mL    lactulose (CHRONULAC) 10 GM/15ML solution TAKE 3 TEASPOONSFUL (15ML) BY MOUTH TWICE DAILY Qty: 946 mL, Refills: 5    lidocaine (XYLOCAINE) 2 % solution TAKE TWO TEASPOONSFUL (10ML) BY MOUTH BEFORE MEALS AND AT BEDTIME TO PREVENT CHEST PAIN WHILE EATING Qty: 500 mL, Refills: 0     lidocaine-prilocaine (EMLA) cream Apply 1 application topically See admin instructions. Applied prior to port access Refills: 6    LORazepam (ATIVAN) 1 MG tablet Take 0.5-1 tablets (0.5-1 mg total) by mouth 2 (two) times daily as needed (FOR BACK SPASM OR ANXIETY). FOR BACK SPASM OR ANXIETY Qty: 45 tablet, Refills: 1    midodrine (PROAMATINE) 10 MG tablet Take 1 tablet (10 mg total) by mouth daily. *Takes only on dialysis days on Tuesdays, Thursdays, and Saturdays (Sometimes takes treatments on Friday instead of Saturday)**Medication to keep your blood pressure from falling. Qty: 60 tablet, Refills: 3    Oxycodone HCl 10 MG TABS 1 PO TID AS NEEDED FOR PAIN Qty: 90 tablet, Refills: 0    pantoprazole (PROTONIX) 40 MG tablet Take 1 tablet (40 mg total) by mouth 2 (two) times daily. Qty: 60 tablet, Refills: 2    sucralfate (CARAFATE) 1 GM/10ML suspension Take 10 mLs (1 g total) by mouth 4 (four) times daily. Qty: 420 mL, Refills: 1    multivitamin (RENA-VIT) TABS tablet Take 1 tablet by mouth at bedtime. Qty: 30 tablet, Refills: 0    rifaximin (XIFAXAN) 550 MG TABS tablet Take 1 tablet (550 mg total) by mouth 2 (two) times daily. Qty: 60 tablet, Refills: 0      STOP taking these medications     cefUROXime (CEFTIN) 250 MG tablet        Allergies  Allergen Reactions  . Lasix [Furosemide]     "doesn't work"  . Latex Itching  . Morphine And Related Hives and Itching      The results of significant diagnostics from this hospitalization (including imaging, microbiology, ancillary and laboratory) are listed below for reference.    Significant Diagnostic Studies: Ct Abdomen Pelvis W Contrast  08/20/2015  CLINICAL DATA:  40 year old female with lower abdominal pain. EXAM: CT ABDOMEN AND PELVIS WITH CONTRAST TECHNIQUE: Multidetector CT imaging of the abdomen and pelvis was performed using the standard protocol following bolus administration of intravenous contrast. CONTRAST:  146mL  OMNIPAQUE IOHEXOL 300 MG/ML  SOLN COMPARISON:  Abdominal ultrasound dated 08/16/2015 FINDINGS: The visualized lung bases are clear. No intra-abdominal free air. Small ascites, diffuse mesenteric and subcutaneous soft tissue edema, and anasarca. Morphologic changes of cirrhosis. There is a 1.9 cm linear hypodensity extending from the inferior surface of the right lobe of the liver which may represent an age indeterminate laceration or a fissure. No active hemorrhage identified. There multiple stones within the gallbladder. The pancreas appears unremarkable. Top-normal spleen size measuring up to 13 cm. The adrenal glands appear unremarkable. There is no hydronephrosis. The visualized ureters, the urinary bladder, the uterus, and the ovaries appear grossly unremarkable. There is moderate stool throughout the colon. No evidence of bowel obstruction or inflammation. The the appendix is not visualized with certainty. A linear structure extending from the cecum (series 2, image 50 and coronal image 46) may represent a normal appendix. Mild aortoiliac atherosclerotic disease. The origins of the celiac axis, SMA, IMA as well as the origins of the renal arteries are patent.  The splenic vein is patent. Prominent subcutaneous collateral veins noted in the right lower quadrant and pericecal region. No portal venous gas identified. There is no adenopathy. There is a small fat containing umbilical hernia. There is extension of ascitic fluid into the umbilical hernia. There is diffuse subcutaneous soft tissue edema and anasarca. There is mild degenerative changes of the spine. No acute fracture. 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 IMPRESSION: Cirrhosis with small ascites, diffuse mesenteric edema and anasarca. Cholelithiasis. Linear hypodensity in the inferior surface of the right lobe of the liver may represent an age indeterminate laceration or a fissure. No evidence of  active hemorrhage. Constipation.  No evidence of bowel obstruction or inflammation. Electronically Signed   By: Anner Crete M.D.   On: 08/20/2015 06:19   US Paracentesis  08/16/2015  CLINICAL DATA:  Symptomatic ascites.  Cirrhosis from NASH EXAM: ULTRASOUND GUIDED  PARACENTESIS COMPARISON:  07/31/2015 PROCEDURE: An ultrasound guided paracentesis was discussed with the patient and questions answered. The patient understands the procedure and wishes to proceed with the procedure. Written consent was obtained. Ultrasound was performed to localize and mark an adequate pocket of fluid in the left lower quadrant of the abdomen. The area was then prepped and draped in the normal sterile fashion. 1% Lidocaine was used for local anesthesia. Under ultrasound guidance a 19 gauge Yueh catheter was introduced. Paracentesis was performed. The catheter was removed and a dressing applied. COMPLICATIONS: None. FINDINGS: A total of approximately 4.8 L of straw colored ascitic fluid was removed. A fluid sample was reserved for laboratory analysis if requested. IMPRESSION: Successful ultrasound guided paracentesis yielding 4.8 L of ascites. Electronically Signed   By: Monte Fantasia M.D.   On: 08/16/2015 16:14   US Paracentesis  07/31/2015  CLINICAL DATA:  Cirrhosis, ascites EXAM: ULTRASOUND GUIDED DIAGNOSTIC AND THERAPEUTIC PARACENTESIS COMPARISON:  07/17/2015 PROCEDURE: Procedure, benefits, and risks of procedure were discussed with patient. Written informed consent for procedure was obtained. Time out protocol followed. Adequate collection of ascites localized by ultrasound in LEFT lower quadrant. Skin prepped and draped in usual sterile fashion. Skin and soft tissues anesthetized with 10 mL of 1% lidocaine. 5 [place] Yueh catheter placed into peritoneal cavity. 4500 mL of yellow ascitic fluid aspirated by vacuum bottle suction. Procedure tolerated well by patient without immediate complication. COMPLICATIONS: None.  FINDINGS: A total of approximately 4500 mL of ascitic fluid was removed. A fluid sample of 180 mL was sent for laboratory analysis. IMPRESSION: Successful ultrasound guided paracentesis yielding 4500 mL of ascites. Electronically Signed   By: Lavonia Dana M.D.   On: 07/31/2015 16:48    Microbiology: Recent Results (from the past 240 hour(s))  Culture, body fluid-bottle     Status: None (Preliminary result)   Collection Time: 08/16/15  3:11 PM  Result Value Ref Range Status   Specimen Description ABDOMEN  Final   Special Requests BOTTLES DRAWN AEROBIC AND ANAEROBIC 5CC EACH  Final   Culture NO GROWTH 3 DAYS  Final   Report Status PENDING  Incomplete  Culture, blood (routine x 2)     Status: None (Preliminary result)   Collection Time: 08/20/15  4:35 AM  Result Value Ref Range Status   Specimen Description BLOOD LEFT WRIST  Final   Special Requests BOTTLES DRAWN AEROBIC AND ANAEROBIC 10 CC EACH  Final   Culture PENDING  Incomplete   Report Status PENDING  Incomplete  Culture, blood (routine x 2)  Status: None (Preliminary result)   Collection Time: 08/20/15  5:00 AM  Result Value Ref Range Status   Specimen Description BLOOD LEFT HAND  Final   Special Requests BOTTLES DRAWN AEROBIC AND ANAEROBIC 10 CC EACH  Final   Culture PENDING  Incomplete   Report Status PENDING  Incomplete     Labs: Basic Metabolic Panel:  Recent Labs Lab 08/20/15 0450 08/21/15 0552 08/22/15 0532  NA 134* 139 135  K 3.9 3.6 3.6  CL 97* 102 99*  CO2 21* 27 26  GLUCOSE 102* 80 115*  BUN 98* 35* 48*  CREATININE 5.95* 3.15* 4.98*  CALCIUM 9.2 8.3* 8.2*   Liver Function Tests:  Recent Labs Lab 08/20/15 0450 08/21/15 0552  AST 79* 60*  ALT 37 29  ALKPHOS 116 102  BILITOT 2.1* 1.4*  PROT 6.9 5.9*  ALBUMIN 3.4* 2.8*    Recent Labs Lab 08/20/15 0450  LIPASE 47    Recent Labs Lab 08/20/15 0450  AMMONIA 69*   CBC:  Recent Labs Lab 08/20/15 0450 08/21/15 0552 08/22/15 0532  WBC  4.9 3.8* 5.3  NEUTROABS 3.4  --   --   HGB 12.0 10.8* 10.7*  HCT 37.2 33.6* 34.0*  MCV 95.9 96.3 98.3  PLT 88* 79* 84*     Signed: Chayanne Speir. MD  [facility] 08/22/2015, 3:32 PM  By signing my name below, I, Rosalie Doctor, attest that this documentation has been prepared under the direction and in the presence of [facility]. MD Electronically Signed: Rosalie Doctor, Scribe. 08/22/2015  I, Dr. Kathie Dike, personally performed the services described in this documentaiton. All medical record entries made by the scribe were at my direction and in my presence. I have reviewed the chart and agree that the record reflects my personal performance and is accurate and complete  Kathie Dike, MD, 08/22/2015 3:32 PM

## 2015-08-22 NOTE — Care Management Note (Signed)
Case Management Note  Patient Details  Name: Wanda [name] MRN: [id number] Date of Birth: 07-Aug-1976   Expected Discharge Date:                  Expected Discharge Plan:  Home/Self Care  In-House Referral:  NA  Discharge planning Services  CM Consult  Post Acute Care Choice:  NA Choice offered to:  NA  DME Arranged:    DME Agency:     HH Arranged:    [facility]:     Status of Service:  Completed, signed off  Medicare Important Message Given:    Date Medicare IM Given:    Medicare IM give by:    Date Additional Medicare IM Given:    Additional Medicare Important Message give by:     If discussed at [place] of Stay Meetings, dates discussed:    Additional Comments: Pt discharging home with self care. No CM needs.  Sherald Barge, RN 08/22/2015, 3:40 PM

## 2015-08-22 NOTE — Progress Notes (Signed)
Subjective: Patient is feeling slightly better but still she has some abdominal pain. She doesn't have any nausea or vomiting.   Objective: Vital signs in last 24 hours: Temp:  [98 F (36.7 C)-99.1 F (37.3 C)] 98 F (36.7 C) (01/12 0441) Pulse Rate:  [75-90] 85 (01/12 0441) Resp:  [18-20] 20 (01/12 0441) BP: (72-133)/(24-42) 75/35 mmHg (01/12 0441) SpO2:  [95 %-98 %] 98 % (01/12 0441) Weight:  [125 lb (56.7 kg)] 125 lb (56.7 kg) (01/12 0441)  Intake/Output from previous day: 01/11 0701 - 01/12 0700 In: 120 [P.O.:120] Out: -  Intake/Output this shift:     Recent Labs  08/20/15 0450 08/21/15 0552 08/22/15 0532  HGB 12.0 10.8* 10.7*    Recent Labs  08/21/15 0552 08/22/15 0532  WBC 3.8* 5.3  RBC 3.49* 3.46*  HCT 33.6* 34.0*  PLT 79* 84*    Recent Labs  08/21/15 0552 08/22/15 0532  NA 139 135  K 3.6 3.6  CL 102 99*  CO2 27 26  BUN 35* 48*  CREATININE 3.15* 4.98*  GLUCOSE 80 115*  CALCIUM 8.3* 8.2*    Recent Labs  08/21/15 0552  INR 1.24    Generally patient is alert and in no apparent distress Chest is clear to auscultation Heart exam regular rate and rhythm no murmur Abdomen: Distended, slight tenderness and no rebound tenderness. Extremities she has trace edema  Assessment/Plan: Problem #1 abdominal pain: Presently feeling better. Patient with abdominal wall cellulitis which seems to be improving. Presently she is a febrile and her white blood cell count is normal. Problem #2 end-stage renal disease: She is status post hemodialysis yesterday. Presently she is asymptomatic. Problem #3 anemia: Her hemoglobin is above our target goal Problem #4 with liver cirrhosis and ascites. Patient status post paracentesis presently her ascites has improved and no difficulty breathing. Problem #5 history of bipolar disorder Problem #6 metabolic bone disease: Her calcium is range. Plan: Patient doesn't require dialysis today We'll make arrangements for her to  get dialysis tomorrow.   Judas Mohammad S 08/22/2015, 8:31 AM

## 2015-08-23 LAB — CULTURE, BODY FLUID W GRAM STAIN -BOTTLE: Culture: NO GROWTH

## 2015-08-23 LAB — CULTURE, BODY FLUID-BOTTLE

## 2015-08-23 NOTE — Progress Notes (Signed)
Quick Note:  Patient will need hospital follow in 3-4 weeks to reschedule EGD. ______

## 2015-08-25 LAB — CULTURE, BLOOD (ROUTINE X 2)
CULTURE: NO GROWTH
Culture: NO GROWTH

## 2015-08-27 IMAGING — US US PARACENTESIS
1 series · 4 of 4 positions shown · non-contrast
Comparison: 01/16/2015

CLINICAL DATA: Cirrhosis, ascites

EXAM:
ULTRASOUND GUIDED THERAPEUTIC PARACENTESIS

[Series 1: us paracentesis · 0.20mm/px · 4 of 4 slices shown]
[im 1/4]
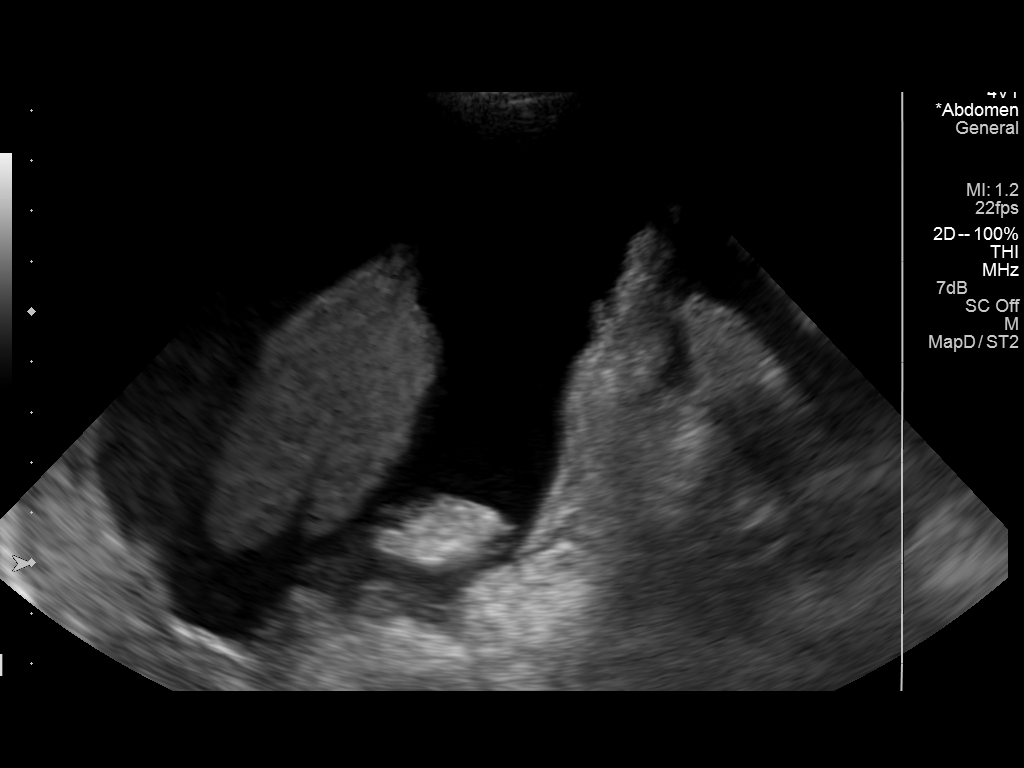
[im 2/4]
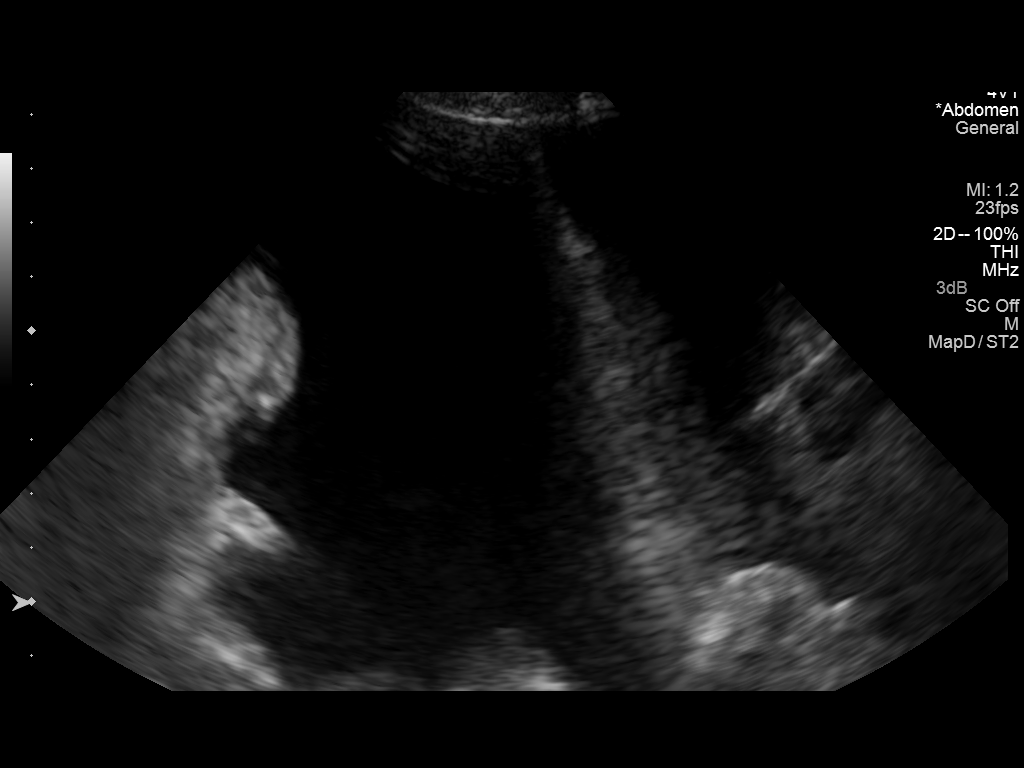
[im 3/4]
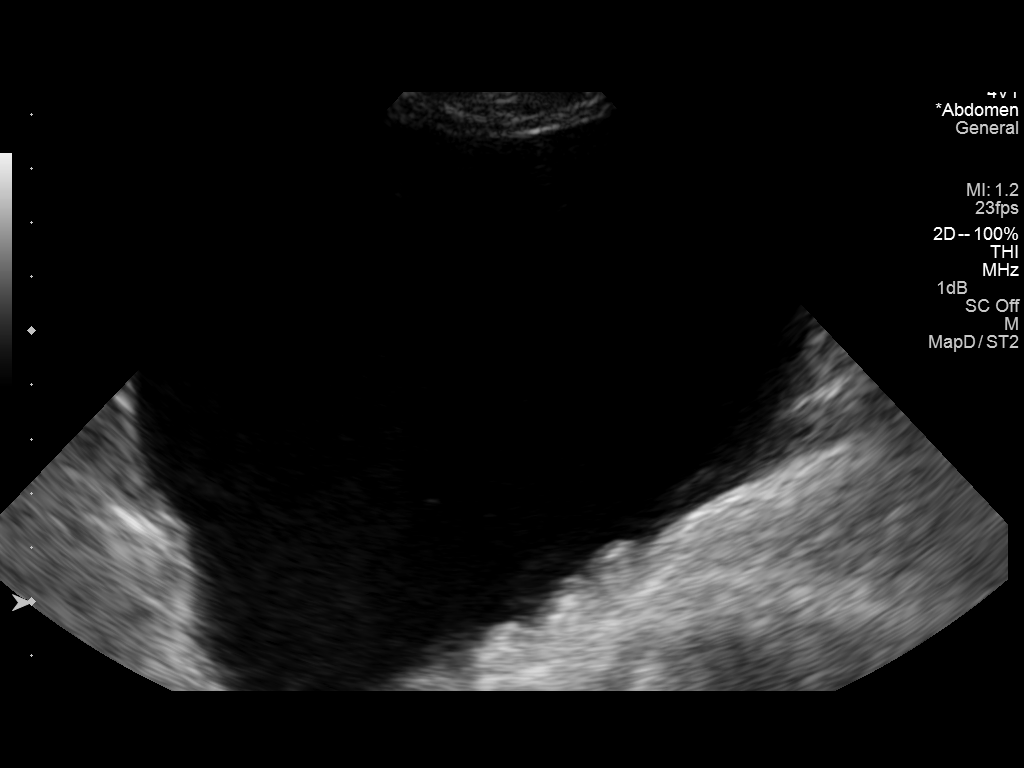
[im 4/4]
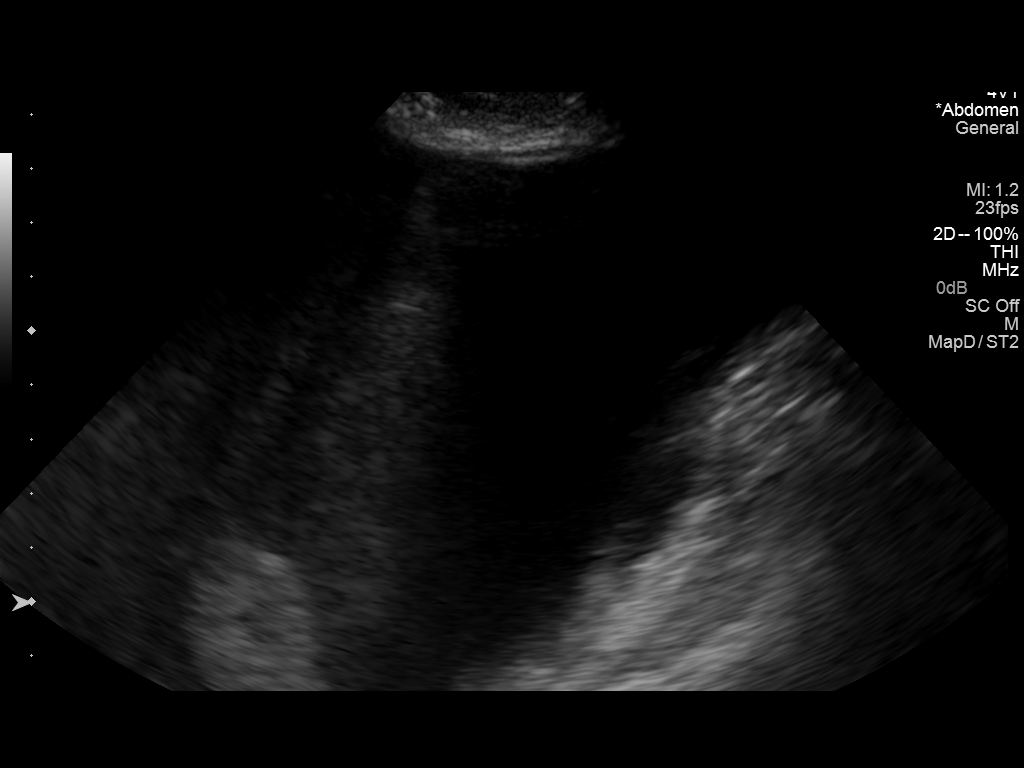

[4 of 4 positions shown; findings below may reference images not displayed]

PROCEDURE:
Procedure, benefits, and risks of procedure were discussed with
patient.

Written informed consent for procedure was obtained.

Time out protocol followed.

Adequate collection of ascites localized by ultrasound in RIGHT
lower quadrant.

Skin prepped and draped in usual sterile fashion.

Skin and soft tissues anesthetized with 10 mL of 1% lidocaine.

5 [place] Yueh catheter placed into peritoneal cavity.

1512 mL of amber color fluid aspirated by vacuum bottle suction.

Procedure tolerated well by patient without immediate complication.

COMPLICATIONS:
None
FINDINGS: As above
IMPRESSION: Successful ultrasound guided paracentesis yielding 1512 ml of
ascites.

## 2015-08-30 ENCOUNTER — Encounter: Payer: Self-pay | Admitting: Gastroenterology

## 2015-08-30 NOTE — Progress Notes (Signed)
APPT MADE AND LETTER SENT  °

## 2015-09-05 ENCOUNTER — Other Ambulatory Visit: Payer: Self-pay | Admitting: Nurse Practitioner

## 2015-09-05 ENCOUNTER — Telehealth: Payer: Self-pay | Admitting: Gastroenterology

## 2015-09-05 MED ORDER — OXYCODONE HCL 10 MG PO TABS: ORAL_TABLET | ORAL | Status: DC

## 2015-09-05 NOTE — Addendum Note (Signed)
Addended by: Danie Binder on: 09/05/2015 03:52 PM   Modules accepted: Orders

## 2015-09-05 NOTE — Telephone Encounter (Signed)
Wanda [name] came by and picked up the prescriptions.

## 2015-09-05 NOTE — Telephone Encounter (Signed)
PLEASE CALL PT. SHE CAN PCIK UP RX AFTER 2 PM TODAY.

## 2015-09-05 NOTE — Telephone Encounter (Signed)
Pt called and is aware to come by to pick up the prescription.

## 2015-09-05 NOTE — Telephone Encounter (Signed)
Tried to call pt and not available. Could not leave VM.

## 2015-09-05 NOTE — Telephone Encounter (Signed)
Wanda [name] called to say that she will be out of her pain medication on the 31st and could we write her another prescription. Please advise and call her back

## 2015-09-05 NOTE — Telephone Encounter (Signed)
Please see phone note of 04/11/2015 in reference to pain med prescriptions. Next was due on 08/11/2015.

## 2015-09-05 NOTE — Telephone Encounter (Signed)
RX WRITTEN FOR JAN 31, MAR 1, APR 1 #90, [id number]

## 2015-09-14 IMAGING — US US PARACENTESIS
1 series · 2 of 2 positions shown · non-contrast
Comparison: 01/25/2015

CLINICAL DATA: Cirrhosis, ascites

EXAM:
ULTRASOUND GUIDED THERAPEUTIC PARACENTESIS

[Series 1: us paracentesis · 0.20mm/px · 2 of 2 slices shown]
[im 1/2]
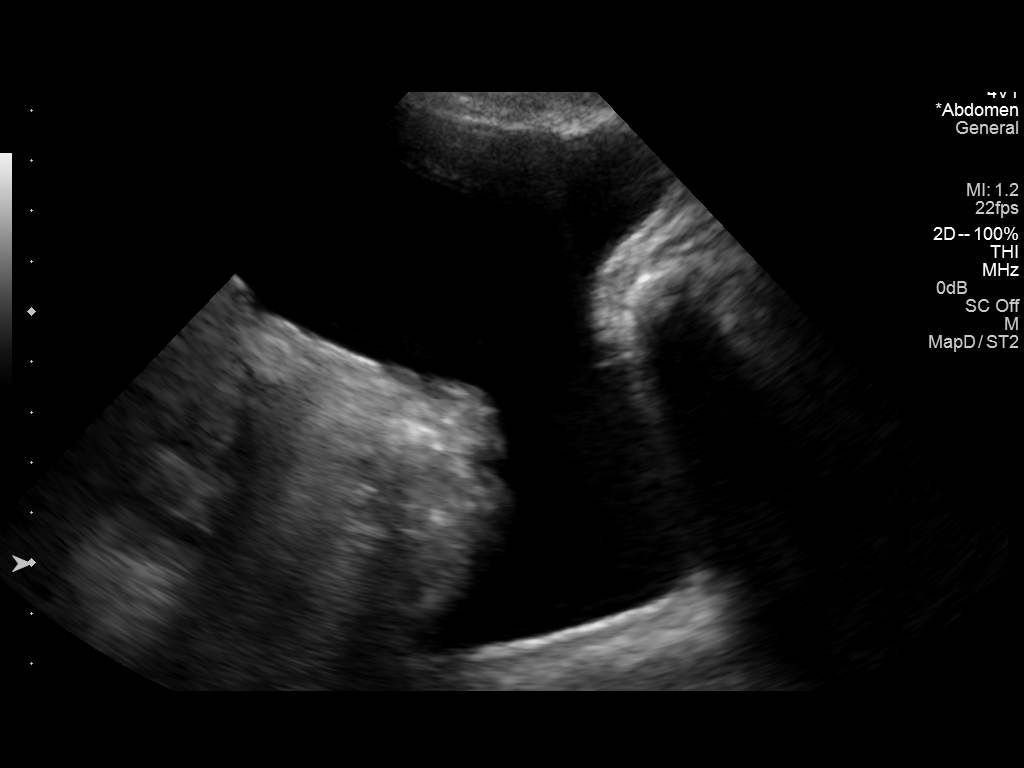
[im 2/2]
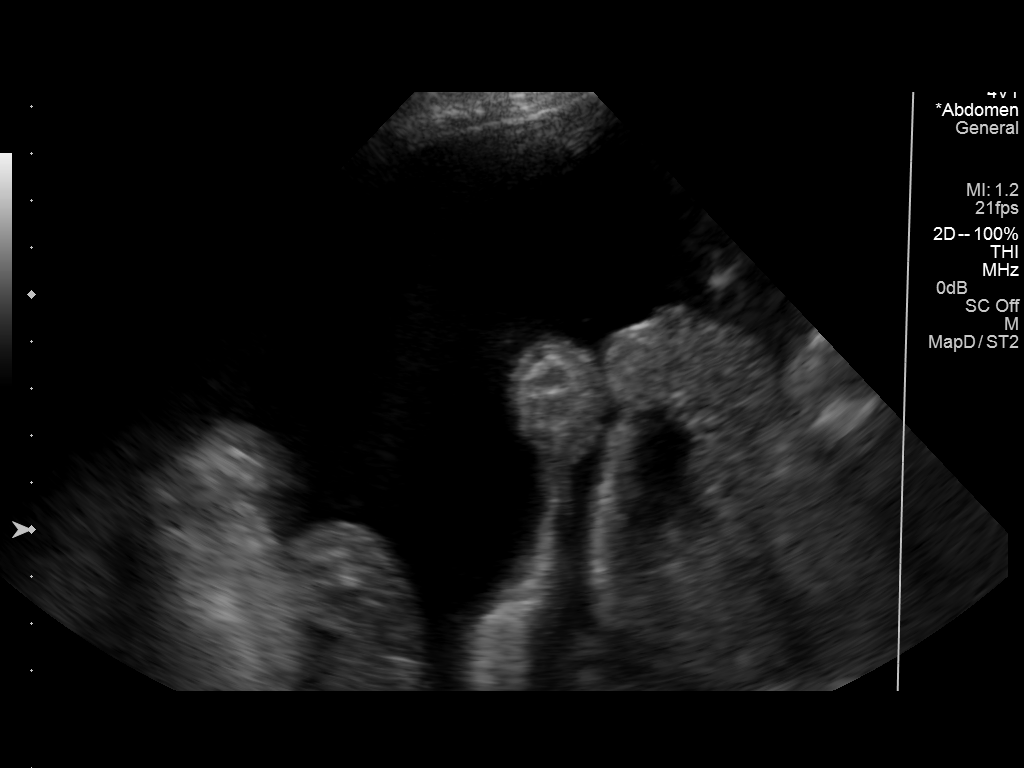

[2 of 2 positions shown; findings below may reference images not displayed]

PROCEDURE:
Procedure, benefits, and risks of procedure were discussed with
patient.

Written informed consent for procedure was obtained.

Time out protocol followed.

Adequate collection of ascites localized by ultrasound in RIGHT
lower quadrant.

Skin prepped and draped in usual sterile fashion.

Skin and soft tissues anesthetized with 10 mL of 1% lidocaine.

5 French Yueh catheter placed into peritoneal cavity.

9777 mL of amber color fluid aspirated by vacuum bottle suction.

Procedure tolerated well by patient without immediate complication.

COMPLICATIONS:
None
FINDINGS: As above
IMPRESSION: Successful ultrasound guided paracentesis yielding 9777 mL of
ascites.

## 2015-09-17 ENCOUNTER — Other Ambulatory Visit: Payer: Self-pay

## 2015-09-17 MED ORDER — LORAZEPAM 1 MG PO TABS: 0.5000 mg | ORAL_TABLET | Freq: Two times a day (BID) | ORAL | Status: DC | PRN

## 2015-09-20 ENCOUNTER — Encounter (HOSPITAL_COMMUNITY): Payer: Self-pay

## 2015-09-20 ENCOUNTER — Ambulatory Visit (HOSPITAL_COMMUNITY)
Admission: RE | Admit: 2015-09-20 | Discharge: 2015-09-20 | Disposition: A | Discharge status: Home / Self Care | Payer: Medicare Other | Source: Ambulatory Visit | Attending: Gastroenterology | Admitting: Gastroenterology

## 2015-09-20 DIAGNOSIS — R188 Other ascites: Secondary | ICD-10-CM | POA: Diagnosis present

## 2015-09-20 DIAGNOSIS — K7460 Unspecified cirrhosis of liver: Secondary | ICD-10-CM | POA: Insufficient documentation

## 2015-09-20 MED ORDER — ALBUMIN HUMAN 25 % IV SOLN
INTRAVENOUS | Status: AC
  Administered 2015-09-20: 50 g via INTRAVENOUS
  Filled 2015-09-20: qty 200

## 2015-09-20 MED ORDER — ALBUMIN HUMAN 25 % IV SOLN
50.0000 g | Freq: Once | INTRAVENOUS | Status: AC
  Administered 2015-09-20: 50 g via INTRAVENOUS

## 2015-09-20 NOTE — Procedures (Signed)
PreOperative Dx: Cirrhosis, ascites Postoperative Dx: Cirrhosis, ascites Procedure:   US guided paracentesis Radiologist:  Thornton Papas Anesthesia:  10 ml of 1% lidocaine Specimen:  5350 ml of amber colored ascitic fluid EBL:   < 1 ml Complications:  None

## 2015-09-20 NOTE — Progress Notes (Signed)
Paracentesis compete no signs of distress 5350 ml yellow colored ascites removed.

## 2015-09-25 ENCOUNTER — Ambulatory Visit: Payer: Self-pay | Admitting: Gastroenterology

## 2015-09-25 ENCOUNTER — Telehealth: Payer: Self-pay | Admitting: Gastroenterology

## 2015-09-25 ENCOUNTER — Encounter: Payer: Self-pay | Admitting: Gastroenterology

## 2015-09-25 NOTE — Telephone Encounter (Signed)
PATIENT WAS A NO SHOW AND LETTER SENT  °

## 2015-10-01 IMAGING — US US PARACENTESIS
1 series · 3 of 3 positions shown · non-contrast
Comparison: 02/12/2015

CLINICAL DATA: Cirrhosis, recurrent ascites

EXAM:
ULTRASOUND GUIDED THERAPEUTIC PARACENTESIS

[Series 1: us paracentesis · 0.23mm/px · 3 of 3 slices shown]
[im 1/3]
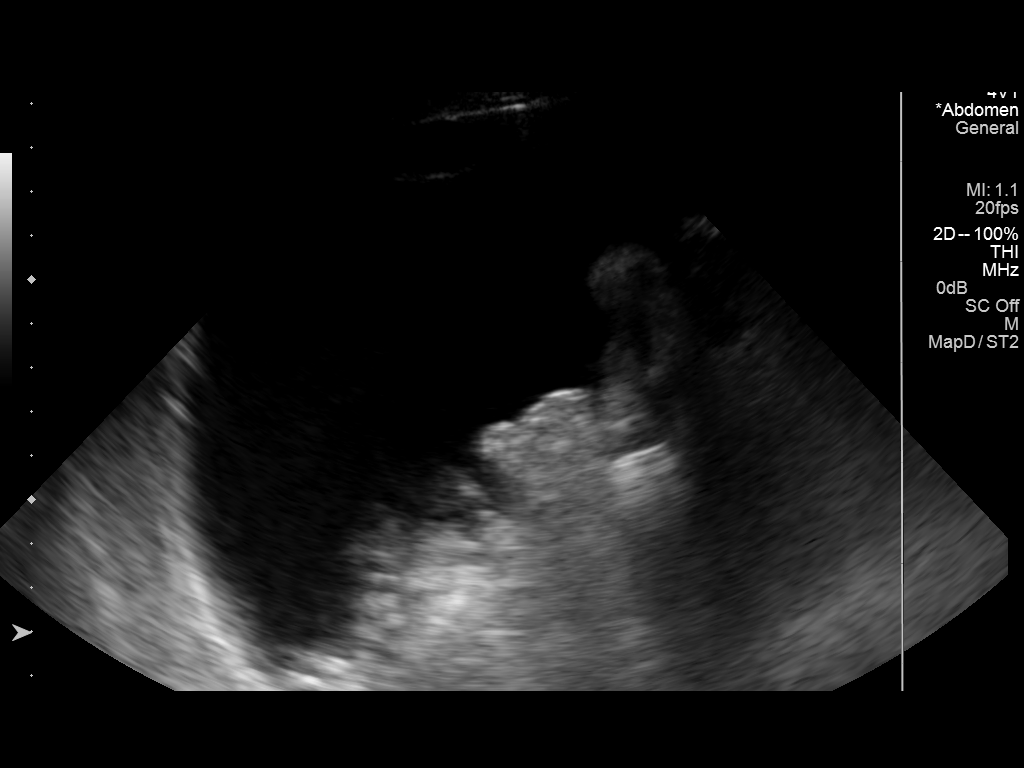
[im 2/3]
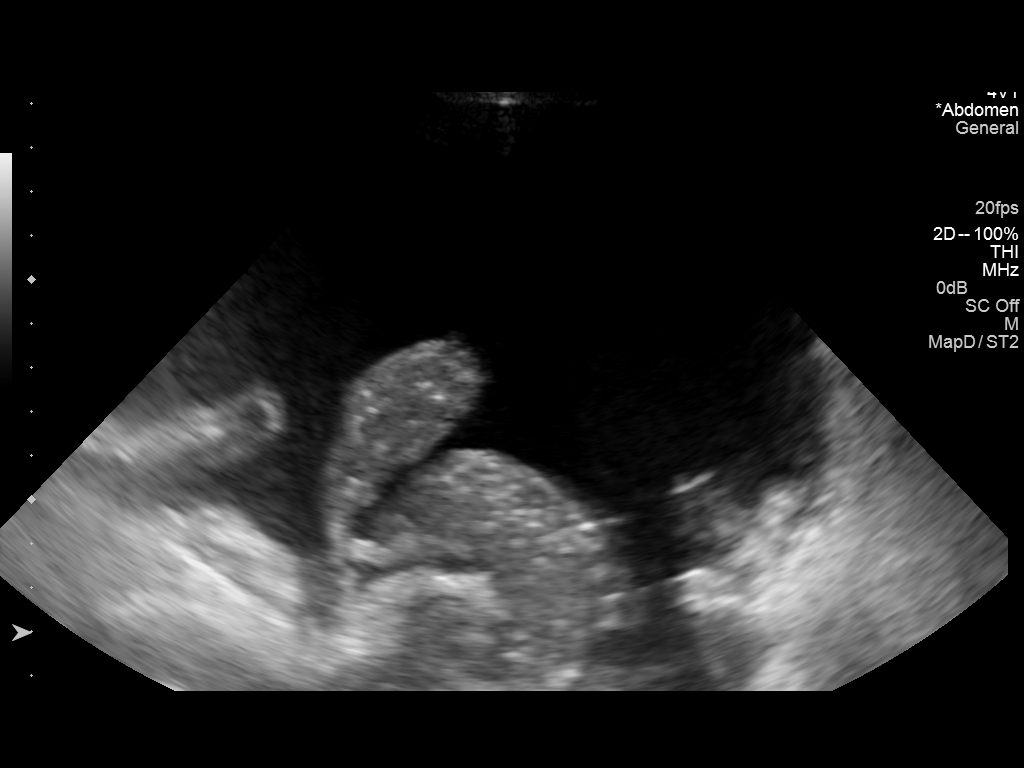
[im 3/3]
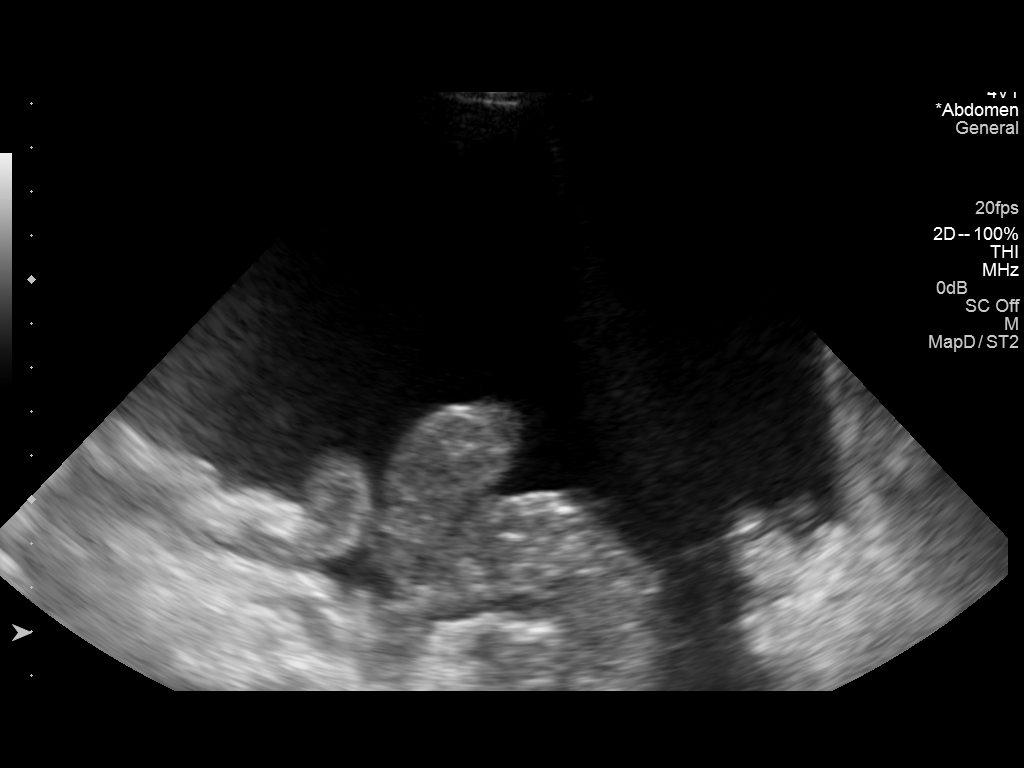

[3 of 3 positions shown; findings below may reference images not displayed]

PROCEDURE:
Procedure, benefits, and risks of procedure were discussed with
patient.

Written informed consent for procedure was obtained.

Time out protocol followed.

Adequate collection of ascites localized by ultrasound in RIGHT
lower quadrant.

Skin prepped and draped in usual sterile fashion.

Skin and soft tissues anesthetized with 10 mL of 1% lidocaine.

5 French Yueh catheter placed into peritoneal cavity.

2363 mL of yellow fluid aspirated by vacuum bottle suction.

Procedure tolerated well by patient without immediate complication.

COMPLICATIONS:
None
FINDINGS: As above
IMPRESSION: Successful ultrasound guided paracentesis yielding 2363 mL of
ascites.

## 2015-10-08 ENCOUNTER — Encounter (HOSPITAL_COMMUNITY): Payer: Self-pay

## 2015-10-08 ENCOUNTER — Encounter: Payer: Self-pay | Admitting: Gastroenterology

## 2015-10-08 ENCOUNTER — Emergency Department (HOSPITAL_COMMUNITY): Payer: Medicare Other

## 2015-10-08 ENCOUNTER — Emergency Department (HOSPITAL_COMMUNITY)
Admission: EM | Admit: 2015-10-08 | Discharge: 2015-10-08 | Disposition: A | Discharge status: Home / Self Care | Payer: Medicare Other | Attending: Emergency Medicine | Admitting: Emergency Medicine

## 2015-10-08 DIAGNOSIS — N186 End stage renal disease: Secondary | ICD-10-CM | POA: Diagnosis not present

## 2015-10-08 DIAGNOSIS — F329 Major depressive disorder, single episode, unspecified: Secondary | ICD-10-CM | POA: Insufficient documentation

## 2015-10-08 DIAGNOSIS — K7460 Unspecified cirrhosis of liver: Secondary | ICD-10-CM | POA: Diagnosis not present

## 2015-10-08 DIAGNOSIS — Z8701 Personal history of pneumonia (recurrent): Secondary | ICD-10-CM | POA: Diagnosis not present

## 2015-10-08 DIAGNOSIS — D696 Thrombocytopenia, unspecified: Secondary | ICD-10-CM

## 2015-10-08 DIAGNOSIS — J209 Acute bronchitis, unspecified: Secondary | ICD-10-CM

## 2015-10-08 DIAGNOSIS — R233 Spontaneous ecchymoses: Secondary | ICD-10-CM | POA: Diagnosis not present

## 2015-10-08 DIAGNOSIS — I9589 Other hypotension: Secondary | ICD-10-CM | POA: Diagnosis not present

## 2015-10-08 DIAGNOSIS — Z992 Dependence on renal dialysis: Secondary | ICD-10-CM | POA: Insufficient documentation

## 2015-10-08 DIAGNOSIS — Z79899 Other long term (current) drug therapy: Secondary | ICD-10-CM | POA: Insufficient documentation

## 2015-10-08 DIAGNOSIS — Z87891 Personal history of nicotine dependence: Secondary | ICD-10-CM | POA: Insufficient documentation

## 2015-10-08 DIAGNOSIS — Z8719 Personal history of other diseases of the digestive system: Secondary | ICD-10-CM | POA: Diagnosis not present

## 2015-10-08 DIAGNOSIS — R21 Rash and other nonspecific skin eruption: Secondary | ICD-10-CM | POA: Diagnosis present

## 2015-10-08 LAB — CBC WITH DIFFERENTIAL/PLATELET
Basophils Absolute: 0 K/uL (ref 0.0–0.1)
Basophils Relative: 1 %
Eosinophils Absolute: 0.2 K/uL (ref 0.0–0.7)
Eosinophils Relative: 4 %
HCT: 39 % (ref 36.0–46.0)
Hemoglobin: 13.3 g/dL (ref 12.0–15.0)
Lymphocytes Relative: 18 %
Lymphs Abs: 0.7 K/uL (ref 0.7–4.0)
MCH: 32.8 pg (ref 26.0–34.0)
MCHC: 34.1 g/dL (ref 30.0–36.0)
MCV: 96.3 fL (ref 78.0–100.0)
Monocytes Absolute: 0.5 K/uL (ref 0.1–1.0)
Monocytes Relative: 13 %
Neutro Abs: 2.5 K/uL (ref 1.7–7.7)
Neutrophils Relative %: 64 %
Platelets: 78 K/uL — ABNORMAL LOW (ref 150–400)
RBC: 4.05 MIL/uL (ref 3.87–5.11)
RDW: 15.3 % (ref 11.5–15.5)
Smear Review: DECREASED
WBC: 3.9 K/uL — ABNORMAL LOW (ref 4.0–10.5)

## 2015-10-08 LAB — COMPREHENSIVE METABOLIC PANEL
ALBUMIN: 4 g/dL (ref 3.5–5.0)
ALK PHOS: 113 U/L (ref 38–126)
ALT: 31 U/L (ref 14–54)
ANION GAP: 21 — AB (ref 5–15)
AST: 82 U/L — ABNORMAL HIGH (ref 15–41)
BILIRUBIN TOTAL: 1.5 mg/dL — AB (ref 0.3–1.2)
BUN: 88 mg/dL — ABNORMAL HIGH (ref 6–20)
CALCIUM: 9.4 mg/dL (ref 8.9–10.3)
CO2: 19 mmol/L — ABNORMAL LOW (ref 22–32)
Chloride: 91 mmol/L — ABNORMAL LOW (ref 101–111)
Creatinine, Ser: 10.55 mg/dL — ABNORMAL HIGH (ref 0.44–1.00)
GFR calc Af Amer: 5 mL/min — ABNORMAL LOW (ref >60)
GFR, EST NON AFRICAN AMERICAN: 4 mL/min — AB (ref >60)
GLUCOSE: 81 mg/dL (ref 65–99)
POTASSIUM: 3.8 mmol/L (ref 3.5–5.1)
Sodium: 131 mmol/L — ABNORMAL LOW (ref 135–145)
TOTAL PROTEIN: 8.1 g/dL (ref 6.5–8.1)

## 2015-10-08 LAB — PROTIME-INR
INR: 1.11 (ref 0.00–1.49)
PROTHROMBIN TIME: 14.5 s (ref 11.6–15.2)

## 2015-10-08 MED ORDER — AZITHROMYCIN 250 MG PO TABS: ORAL_TABLET | ORAL | Status: DC

## 2015-10-08 MED ORDER — DIPHENHYDRAMINE HCL 25 MG PO CAPS
25.0000 mg | ORAL_CAPSULE | Freq: Once | ORAL | Status: AC
  Administered 2015-10-08: 25 mg via ORAL
  Filled 2015-10-08: qty 1

## 2015-10-08 NOTE — ED Notes (Signed)
C/o itching to arms, abdomen and right back.

## 2015-10-08 NOTE — ED Notes (Signed)
Pt reports generalized reddish/purple rash that started Friday.  Reports itching.  Also c/o cough, no fever.

## 2015-10-08 NOTE — Discharge Instructions (Signed)
Take Benadryl for itching. Follow-up very closely with your primary doctor and gastroenterologist.  If you develop any fevers present to your physician or return to the emergency department.  If you were given medicines take as directed.  If you are on coumadin or contraceptives realize their levels and effectiveness is altered by many different medicines.  If you have any reaction (rash, tongues swelling, other) to the medicines stop taking and see a physician.    If your blood pressure was elevated in the ER make sure you follow up for management with a primary doctor or return for chest pain, shortness of breath or stroke symptoms.  Please follow up as directed and return to the ER or see a physician for new or worsening symptoms.  Thank you. Filed Vitals:   10/08/15 1138 10/08/15 1327  BP: 104/67   Pulse: 105   Temp: 98.3 F (36.8 C)   TempSrc: Oral   Resp: 18   Height:  5\' 2"  (1.575 m)  Weight:  130 lb (58.968 kg)  SpO2: 99%

## 2015-10-08 NOTE — ED Provider Notes (Signed)
History  By signing my name below, I, Marlowe Kays, attest that this documentation has been prepared under the direction and in the presence of Elnora Morrison, MD. Electronically Signed: Marlowe Kays, ED Scribe. 10/08/2015. 12:34 PM  Chief Complaint  Patient presents with  . Rash  . Cough   The history is provided by the patient and medical records. No language interpreter was used.    HPI Comments:  Wanda [name] is a 40 y.o. female who presents to the Emergency Department complaining of an itching, mildly painful rash to all extremities that appeared four days ago. She states it has been worsening and spreading. Pt also reports congestion and cough and states she has been prescribed something She has applied Benadryl cream with relief for a short time. She denies modifying factors. She denies fever, chills, nausea, vomiting, hemoptysis, hematochezia or gum bleeding. She denies bleeding disorder. She reports PMHx of cirrhosis. She denies anticoagulant therapy and is unsure of previous platelet disorders but states she believes she received platelets before during dialysis. She denies alcohol use. Pt is on dialysis with the last session being four days ago.  Past Medical History  Diagnosis Date  . GERD (gastroesophageal reflux disease)   . Cirrhosis ([place]) 10/05/13    Liver bx 11/23/13 (delayed initially due to patient refusal). c/w steatohepatitis  . Folate deficiency 09/2013  . Anasarca 10/10/2013  . Hematemesis/vomiting blood 02/24/2014  . Acute blood loss anemia 02/25/2014    Status post transfusion  . Macrocytosis 02/28/2014  . Bleeding esophageal varices ([place]) 02/28/2014    s/p banding  . Acute renal failure ([place]) 09/2013    Pre-renal- resolved  . C. difficile colitis 04/19/2014  . Anxiety   . Depression   . Thrombocytopenia ([place])     Hypercellular bone marrow; abundant megakaryocytes per 08/27/2014; s/p bone marrow bx  . Cirrhosis of liver with ascites ([place])   . SBP  (spontaneous bacterial peritonitis) ([place]) 11/10/2013  . Bipolar disorder ([place]) 12/04/2013    2007-SEEN IN ED FOR INVOLUNTARY COMMITMENT, UDS POS FOR AMPHETAMINES/OPIATES   . PNA (pneumonia) 10/13/2013  . Chronic hypotension   . Gastroesophageal junction ulcer 09/17/2014  . ESRD (end stage renal disease) on dialysis ([place]) 08/2014   Past Surgical History  Procedure Laterality Date  . None    . Paracentesis  Feb 2015    1180 fluid, negative fluid analysis.   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 Paracentesis  10/2013  . Colonoscopy N/A 12/19/2013    SLF:NO OBVIOUS SOURCE FOR ANEMIA IDETIFIED/ONE COLON POLYP REMOVED/Small internal hemorrhoids  . Esophagogastroduodenoscopy N/A 02/11/2014    Dr. Rourk:Esophageal varices with bleeding stigmata-status post esophageal band ligation therapy. Portal gastropathy  . Esophagogastroduodenoscopy N/A 07/04/2014    RMR: Persiting grade 2 esophageal varicies with bleeding stigmata status post band ligation. Significantly congested gastric mucosa iwith changes constistant with protal gastropathy.   . Esophageal banding  07/04/2014    Procedure: ESOPHAGEAL BANDING;  Surgeon: Daneil Dolin, MD;  Location: [facility];  Service: Endoscopy;;  . Esophagogastroduodenoscopy (egd) with propofol N/A 07/24/2014    SLF:  1. 2 columns grade 2-3 varices- 2 Bands applied.  2.  Moderate gastropathy 3. Duodenal Diverticula  . Esophageal banding N/A 07/24/2014    Procedure: ESOPHAGEAL BANDING (2 bands applied);  Surgeon: Danie Binder, MD;  Location: [facility] ORS;  Service: Endoscopy;  Laterality: N/A;  .  Central venous catheter insertion Right   . Esophagogastroduodenoscopy N/A 09/16/2014    Rehman: Single short column of varix proximal to GE junction not large enough to be banded. Two amall ulcers at the GEJ felt to be source of GI Bleeding but no  active bleeding but no actibe bleeding noted. No therapy rendered. Portal gastropathy NO evidence of peptic ulcer diease or gastric varices.   . Esophagogastroduodenoscopy (egd) with propofol N/A 10/26/2014    RMR: 2 columns of grade 2 esophageal varices without obvious bleeding stigmata status post band ligation to complet obliteration of remaining varices.   . Av fistula placement Right 11/16/2014    Procedure: Right arm Creation of arteriovenous fistula;  Surgeon: Angelia Mould, MD;  Location: [facility] OR;  Service: Vascular;  Laterality: Right;  . Esophagogastroduodenoscopy N/A 12/14/2014    SLF: Grade ! esophageal varices. 2. Moderate Portal Gastropathy  . Esophagogastroduodenoscopy N/A 03/27/2015    Procedure: ESOPHAGOGASTRODUODENOSCOPY (EGD);  Surgeon: Danie Binder, MD;  Location: [facility];  Service: Endoscopy;  Laterality: N/A;  1045am - moved to 817 @ 11:30  . Esophagogastroduodenoscopy N/A 06/05/2015    Procedure: ESOPHAGOGASTRODUODENOSCOPY (EGD);  Surgeon: Clarene Essex, MD;  Location: [facility] ENDOSCOPY;  Service: Endoscopy;  Laterality: N/A;  . Esophagogastroduodenoscopy N/A 07/23/2015    SLF:1. Grade 1 esophageal varices 2. Moderate portal hypertensive gastropathy 3. MILd non-erosive gastritis.   . Esophageal banding N/A 07/23/2015    Procedure: ESOPHAGEAL BANDING;  Surgeon: Danie Binder, MD;  Location: [facility];  Service: Endoscopy;  Laterality: N/A;   Family History  Problem Relation Age of Onset  . Heart disease Mother   . Colon cancer Neg Hx   . Liver disease Neg Hx    Social History  Substance Use Topics  . Smoking status: Former Smoker -- 0.25 packs/day for 20 years    Types: Cigarettes    Quit date: 11/05/2008  . Smokeless tobacco: Never Used  . Alcohol Use: No   OB History    Gravida Para Term Preterm AB TAB SAB Ectopic Multiple Living   1 1 1       1      Review of Systems A complete 10 system review of systems was obtained and all systems are negative  except as noted in the HPI and PMH.   Allergies  Lasix; Latex; and Morphine and related  Home Medications   Prior to Admission medications   Medication Sig Start Date End Date Taking? Authorizing Provider  Darbepoetin Alfa (ARANESP) 60 MCG/0.3ML SOSY injection Inject 0.3 mLs (60 mcg total) into the vein every Thursday with hemodialysis. 06/09/15  Yes Cherene Altes, MD  lactulose (CHRONULAC) 10 GM/15ML solution TAKE 3 TEASPOONSFUL (15ML) BY MOUTH TWICE DAILY 07/22/15  Yes Mahala Menghini, PA-C  lidocaine (XYLOCAINE) 2 % solution TAKE TWO TEASPOONSFUL (10ML) BY MOUTH BEFORE MEALS AND AT BEDTIME TO PREVENT CHEST PAIN WHILE EATING 08/19/15  Yes Carlis Stable, NP  lidocaine-prilocaine (EMLA) cream Apply 1 application topically See admin instructions. Applied prior to port access 05/03/15  Yes Historical Provider, MD  LORazepam (ATIVAN) 1 MG tablet Take 0.5-1 tablets (0.5-1 mg total) by mouth 2 (two) times daily as needed (FOR BACK SPASM OR ANXIETY). FOR BACK SPASM OR ANXIETY 09/17/15  Yes Orvil Feil, NP  midodrine (PROAMATINE) 10 MG tablet Take 1 tablet (10 mg total) by mouth daily. *Takes only on dialysis days on Tuesdays, Thursdays, and Saturdays (Sometimes takes treatments on Friday instead of Saturday)**Medication to keep  your blood pressure from falling. 06/09/15  Yes Cherene Altes, MD  Oxycodone HCl 10 MG TABS 1 PO TID AS NEEDED FOR PAIN 09/10/15  Yes Danie Binder, MD  pantoprazole (PROTONIX) 40 MG tablet Take 1 tablet (40 mg total) by mouth 2 (two) times daily. Patient taking differently: Take 40 mg by mouth daily as needed (for acid reflux/GERD).  09/19/14  Yes Kathie Dike, MD  promethazine (PHENERGAN) 12.5 MG tablet TAKE ONE TABLET BY MOUTH EVERY 6 HOURS AS NEEDED FOR NAUSEA AND VOMITING. 09/06/15  Yes Mahala Menghini, PA-C  sucralfate (CARAFATE) 1 GM/10ML suspension Take 10 mLs (1 g total) by mouth 4 (four) times daily. 07/10/15  Yes Danie Binder, MD  calcium acetate (PHOSLO) 667 MG  capsule Take 1 capsule (667 mg total) by mouth 3 (three) times daily with meals. Patient not taking: Reported on 10/08/2015 06/09/15   Cherene Altes, MD  cephALEXin (KEFLEX) 500 MG capsule Take 1 capsule (500 mg total) by mouth 4 (four) times daily. Patient not taking: Reported on 10/08/2015 08/22/15   Kathie Dike, MD  multivitamin (RENA-VIT) TABS tablet Take 1 tablet by mouth at bedtime. Patient not taking: Reported on 08/20/2015 06/09/15   Cherene Altes, MD  rifaximin (XIFAXAN) 550 MG TABS tablet Take 1 tablet (550 mg total) by mouth 2 (two) times daily. Patient not taking: Reported on 08/20/2015 06/09/15   Cherene Altes, MD   Triage Vitals: BP 104/67 mmHg  Pulse 105  Temp(Src) 98.3 F (36.8 C) (Oral)  Resp 18  SpO2 99%  LMP 09/19/2013 Physical Exam  Constitutional: She is oriented to person, place, and time. She appears well-developed and well-nourished.  HENT:  Head: Normocephalic and atraumatic.  Eyes: EOM are normal.  Neck: Normal range of motion.  Cardiovascular: Normal rate, regular rhythm and normal heart sounds.  Exam reveals no gallop and no friction rub.   No murmur heard. Pulmonary/Chest: Effort normal and breath sounds normal. No respiratory distress. She has no wheezes. She has no rales.  Musculoskeletal: Normal range of motion.  Neurological: She is alert and oriented to person, place, and time.  Skin: Skin is warm and dry. Rash noted.  Multiple approximately 1-2 mm non blanchable macules on BLE and a few to BUE. Red discoloration.  Psychiatric: She has a normal mood and affect. Her behavior is normal.  Nursing note and vitals reviewed.   ED Course  Procedures (including critical care time) DIAGNOSTIC STUDIES: Oxygen Saturation is 99% on RA, normal by my interpretation.   COORDINATION OF CARE: 11:56 AM- Will order labs and CXR. Pt verbalizes understanding and agrees to plan.  Medications  diphenhydrAMINE (BENADRYL) capsule 25 mg (25 mg Oral Given  10/08/15 1204)    Labs Review Labs Reviewed  CBC WITH DIFFERENTIAL/PLATELET - Abnormal; Notable for the following:    WBC 3.9 (*)    Platelets 78 (*)    All other components within normal limits  COMPREHENSIVE METABOLIC PANEL - Abnormal; Notable for the following:    Sodium 131 (*)    Chloride 91 (*)    CO2 19 (*)    BUN 88 (*)    Creatinine, Ser 10.55 (*)    AST 82 (*)    Total Bilirubin 1.5 (*)    GFR calc non Af Amer 4 (*)    GFR calc Af Amer 5 (*)    Anion gap 21 (*)    All other components within normal limits  PROTIME-INR    Imaging  Review Dg Chest 2 View  10/08/2015  CLINICAL DATA:  Productive cough off and on for 1 month, history of pneumonia, former smoker, cirrhosis, end-stage renal disease on dialysis EXAM: CHEST  2 VIEW COMPARISON:  06/04/2015 FINDINGS: Normal heart size, mediastinal contours, and pulmonary vascularity. Atherosclerotic calcification aorta. Minimal thickening or fluid at base of major fissure on lateral view. Lungs otherwise clear. No additional pleural effusion or pneumothorax. Bones unremarkable. IMPRESSION: No significant abnormalities. Electronically Signed   By: Lavonia Dana M.D.   On: 10/08/2015 12:30   I have personally reviewed and evaluated these images and lab results as part of my medical decision-making.   EKG Interpretation None      MDM   Final diagnoses:  Acute bronchitis, unspecified organism  ESRD (end stage renal disease) ([place])  Petechiae  Thrombocytopenia ([place])   Patient presents with mild petechial rash with associated pruritus.  Patient well-appearing no fever in the ER rhonchi this symptoms. Blood work reviewed similar to previous. Platelets low patient not actively bleeding. Discussed patient is very close follow-up with her specialist and primary doctor and strict reasons to return for signs of significant infection such as fevers.  Results and differential diagnosis were discussed with the patient/parent/guardian. Xrays  were independently reviewed by myself.  Close follow up outpatient was discussed, comfortable with the plan.   Medications  diphenhydrAMINE (BENADRYL) capsule 25 mg (25 mg Oral Given 10/08/15 1204)    Filed Vitals:   10/08/15 1138 10/08/15 1327  BP: 104/67   Pulse: 105   Temp: 98.3 F (36.8 C)   TempSrc: Oral   Resp: 18   Height:  5\' 2"  (1.575 m)  Weight:  130 lb (58.968 kg)  SpO2: 99%     Final diagnoses:  Acute bronchitis, unspecified organism  ESRD (end stage renal disease) ([place])  Petechiae  Thrombocytopenia ([facility])      Elnora Morrison, MD 10/08/15 [phone]

## 2015-10-10 ENCOUNTER — Telehealth: Payer: Self-pay

## 2015-10-10 NOTE — ED Provider Notes (Signed)
Patient was instructed to follow up for a reassessment the following day.  Yesterday I called her phone number listed in the chart twice , no answer and also left a text message reminding her to follow up closely as instructed and strict reasons to return.    Discussed the case with her PCP office, she has an appointment scheduled however has had non compliance with appointments in the past.  They will also try to reach out to her.   Elnora Morrison Mariea Clonts, MD 10/10/15 [phone]

## 2015-10-10 NOTE — Telephone Encounter (Signed)
T/C from Dr. Reather Converse from the ED. He saw pt on 10/08/2015 and was concerned about her. He would like for her to follow up with Dr. Oneida Alar or someone at our office.  He has been unable to reach pt and also he said PCP has been unable to reach pt. She went to the ED with a rash which was mostly on her legs.  She had some cough and no fever. He said he was just a little concerned about her with all of her problems.  I have called and Left Vm on all three contacts, pt, [name] and her aunt Vaughan Basta for a return call.

## 2015-10-14 NOTE — Telephone Encounter (Signed)
REVIEWED. AGREE. NO ADDITIONAL RECOMMENDATIONS. 

## 2015-10-14 NOTE — Telephone Encounter (Signed)
Could not leave a VM for pt. Wanda [name] and [facility] for a return call, that ED doctor wanted pt to be seen here. Also, mailing a letter to the pt.

## 2015-10-19 IMAGING — US US PARACENTESIS
1 series · 3 of 3 positions shown · non-contrast
Comparison: 03/11/2015

CLINICAL DATA: Cirrhosis, ascites

EXAM:
ULTRASOUND GUIDED THERAPEUTIC PARACENTESIS

[Series 1: us paracentesis · 0.18mm/px · 3 of 3 slices shown]
[im 1/3]
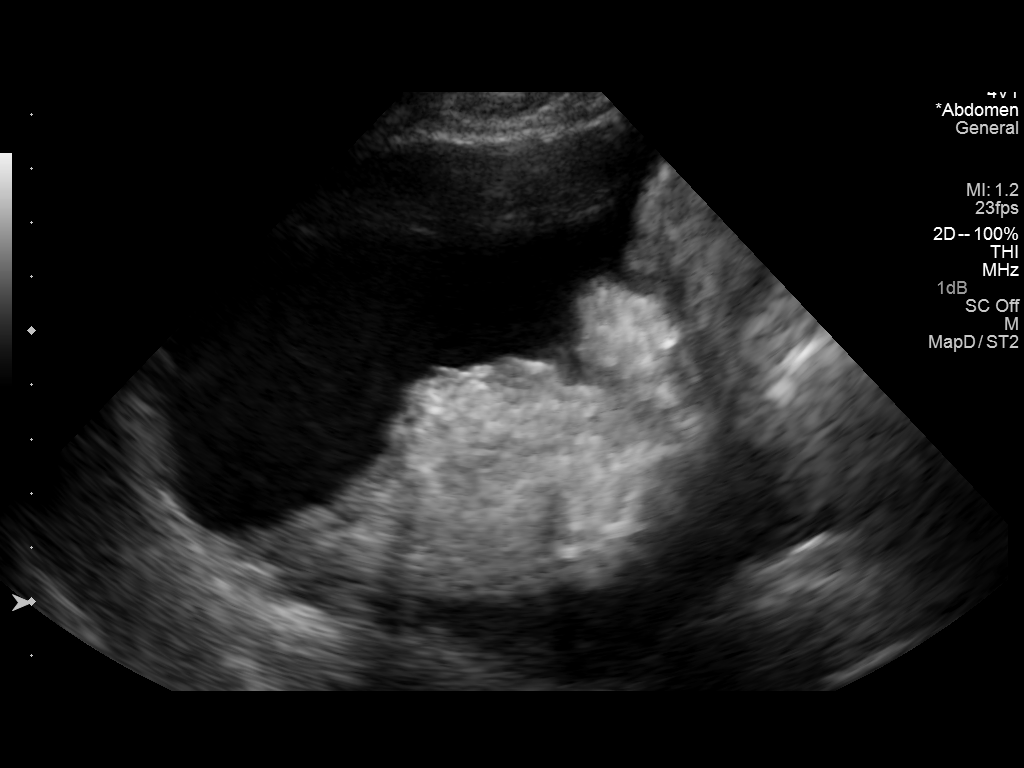
[im 2/3]
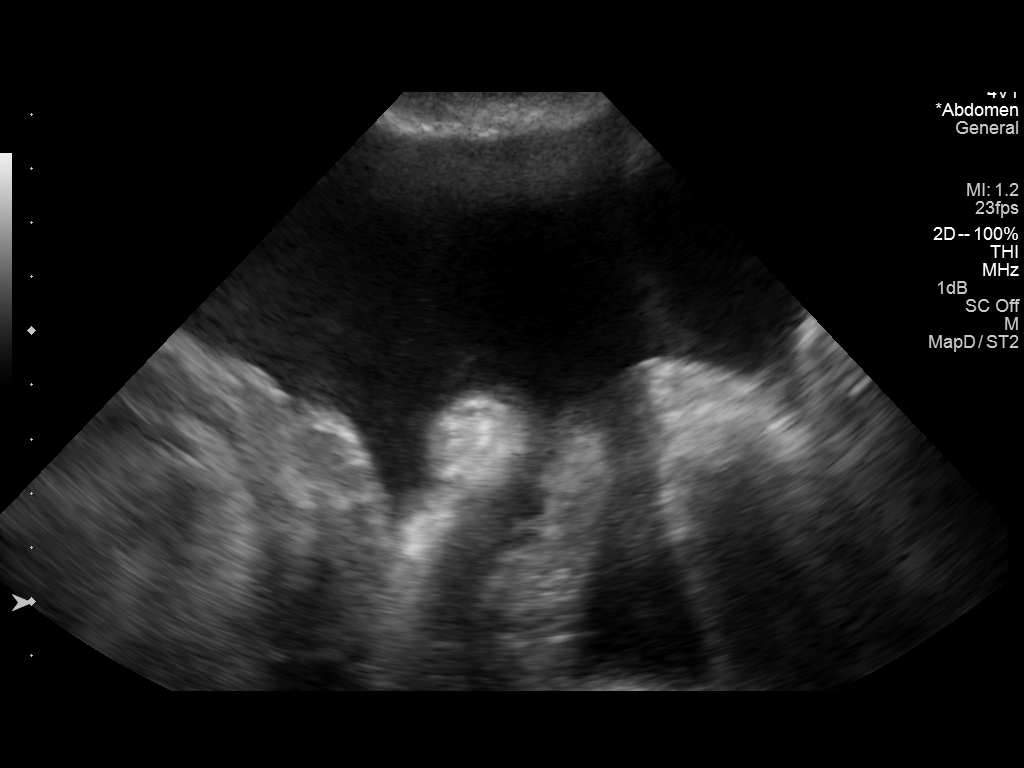
[im 3/3]
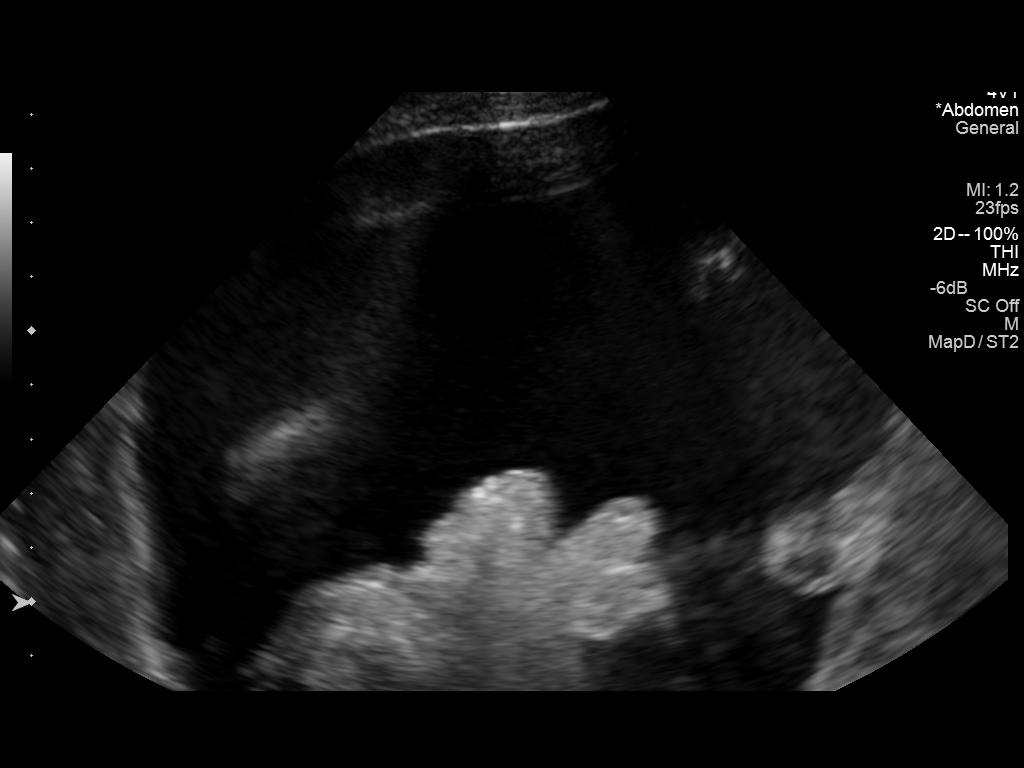

[3 of 3 positions shown; findings below may reference images not displayed]

PROCEDURE:
Procedure, benefits, and risks of procedure were discussed with
patient.

Written informed consent for procedure was obtained.

Time out protocol followed.

Adequate collection of ascites localized by ultrasound in RIGHT
lower quadrant.

Skin prepped and draped in usual sterile fashion.

Skin and soft tissues anesthetized with 10 mL of 1% lidocaine.

5 French Yueh catheter placed into peritoneal cavity.

6099 mL of amber colored ascites aspirated by vacuum bottle suction.

Procedure tolerated well by patient without immediate complication.

COMPLICATIONS:
None
FINDINGS: As above
IMPRESSION: Successful ultrasound guided paracentesis yielding 6099 mL of
ascites.

## 2015-10-21 ENCOUNTER — Other Ambulatory Visit: Payer: Self-pay

## 2015-10-21 NOTE — Telephone Encounter (Signed)
Tried to call pt and she is not available.

## 2015-10-21 NOTE — Telephone Encounter (Signed)
Please notify the patient that 4 weeks ago she received a 1 month supply with 1 refill from [facility]. She should still have the refill available.

## 2015-10-22 ENCOUNTER — Ambulatory Visit: Payer: Self-pay | Admitting: Gastroenterology

## 2015-10-23 ENCOUNTER — Ambulatory Visit (INDEPENDENT_AMBULATORY_CARE_PROVIDER_SITE_OTHER): Payer: Medicare Other | Admitting: Gastroenterology

## 2015-10-23 VITALS — Temp 97.3°F | Ht 62.0 in | Wt 115.4 lb

## 2015-10-23 DIAGNOSIS — K7581 Nonalcoholic steatohepatitis (NASH): Secondary | ICD-10-CM | POA: Diagnosis not present

## 2015-10-23 DIAGNOSIS — L03311 Cellulitis of abdominal wall: Secondary | ICD-10-CM

## 2015-10-23 DIAGNOSIS — K7460 Unspecified cirrhosis of liver: Secondary | ICD-10-CM

## 2015-10-23 MED ORDER — DOXYCYCLINE HYCLATE 100 MG PO CAPS: 100.0000 mg | ORAL_CAPSULE | Freq: Two times a day (BID) | ORAL | Status: DC

## 2015-10-23 NOTE — Telephone Encounter (Signed)
Pt has appt today

## 2015-10-23 NOTE — Patient Instructions (Signed)
1. I will get copy of recent labs from dialysis and review. If you magnesium has not been checked, we will check it for you. 2. Stop clindamycin. 3. Start doxycycline 100mg  twice daily with food for 10 days for skin infection.

## 2015-10-23 NOTE — Progress Notes (Signed)
Primary Care Physician: [name], MD  Primary Gastroenterologist:  Barney Drain, MD   Chief Complaint  Patient presents with  . Follow-up    HPI: Wanda [name] is a 40 y.o. female here for follow up. She had to be rescheduled yesterday because she was late to her appointment. We last saw the patient in 08/2015 when she was Hospitalized for abdominal wall cellulitis. She was due for an EGD during that hospitalization, had been scheduled as an outpatient. Patient declined having done because of her abdominal pain at the time. Because she had a CT in January, her hepatoma screening is up-to-date. On CT with contrast she had a 1.9 cm linear hypodensity extending from the inferior surface of the right lobe of the liver which may represent an age indeterminate laceration or a fissure. Cirrhotic appearing liver. She has gallstones. Small fat containing umbilical hernia. Extension of ascites into the hernia. She is up-to-date on labs.  Patient's predominant complaint is that of ongoing warm/erythema of the abdominal wall. Some on the right side this time, previously was left sided. She has been on clindamycin for over 4 weeks but denies any improvement. Her last LVAP was on February 10. She was seen in emergency department for rash last month. She states she's been having a lot of itching and red bumps popping up on her extremities. She shows me a few of the spots today but really seems unclear whether these are what she was seen last month. What I see today on exam her spider angiomas. She reports that overall her abdominal pain has improved. She denies any significant heartburn. Uses pantoprazole only when necessary. Denies episodes of confusion. Complains of severe muscle cramps in her hands. Bowel movements are 2-3 times daily. Denies melena rectal bleeding.  We did not discuss when she had been seen at [facility] liver transplant center last. I looked up and care everywhere and there has  been a significant compliance issue with keeping appointments. She had a urine drug screen positive for marijuana and has to see counselor. She is scheduled to go back next month, April 10 to see Dr. Margaretha Seeds.  Current Outpatient Prescriptions  Medication Sig Dispense Refill  . Darbepoetin Alfa (ARANESP) 60 MCG/0.3ML SOSY injection Inject 0.3 mLs (60 mcg total) into the vein every Thursday with hemodialysis. 4.2 mL   . lactulose (CHRONULAC) 10 GM/15ML solution TAKE 3 TEASPOONSFUL (15ML) BY MOUTH TWICE DAILY 946 mL 5  . lidocaine (XYLOCAINE) 2 % solution TAKE TWO TEASPOONSFUL (10ML) BY MOUTH BEFORE MEALS AND AT BEDTIME TO PREVENT CHEST PAIN WHILE EATING 500 mL 0  . lidocaine-prilocaine (EMLA) cream Apply 1 application topically See admin instructions. Applied prior to port access  6  . LORazepam (ATIVAN) 1 MG tablet Take 0.5-1 tablets (0.5-1 mg total) by mouth 2 (two) times daily as needed (FOR BACK SPASM OR ANXIETY). FOR BACK SPASM OR ANXIETY 45 tablet 1  . midodrine (PROAMATINE) 10 MG tablet Take 1 tablet (10 mg total) by mouth daily. *Takes only on dialysis days on Tuesdays, Thursdays, and Saturdays (Sometimes takes treatments on Friday instead of Saturday)**Medication to keep your blood pressure from falling. 60 tablet 3  . Oxycodone HCl 10 MG TABS 1 PO TID AS NEEDED FOR PAIN 90 tablet 0  . promethazine (PHENERGAN) 12.5 MG tablet TAKE ONE TABLET BY MOUTH EVERY 6 HOURS AS NEEDED FOR NAUSEA AND VOMITING. 30 tablet 0  . sucralfate (CARAFATE) 1 GM/10ML suspension Take 10 mLs (1  g total) by mouth 4 (four) times daily. 420 mL 1  . pantoprazole (PROTONIX) 40 MG tablet Take 1 tablet (40 mg total) by mouth 2 (two) times daily. (Patient not taking: Reported on 10/23/2015) 60 tablet 2   No current facility-administered medications for this visit.    Allergies as of 10/23/2015 - Review Complete 10/23/2015  Allergen Reaction Noted  . Lasix [furosemide]  04/12/2014  . Latex Itching 05/14/2015  .  Morphine and related Hives and Itching 09/16/2014    ROS:  General: Negative for anorexia, weight loss, fever, chills, fatigue, weakness. ENT: Negative for hoarseness, difficulty swallowing , nasal congestion. CV: Negative for chest pain, angina, palpitations, dyspnea on exertion, peripheral edema.  Respiratory: Negative for dyspnea at rest, dyspnea on exertion, cough, sputum, wheezing.  GI: See history of present illness. GU:  Negative for dysuria, hematuria, urinary incontinence, urinary frequency, nocturnal urination.  Endo: Negative for unusual weight change.    Physical Examination:   Temp(Src) 97.3 F (36.3 C)  Ht 5\' 2"  (1.575 m)  Wt 115 lb 6.4 oz (52.345 kg)  BMI 21.10 kg/m2  LMP 09/19/2013  General: Well-nourished, well-developed in no acute distress.  Eyes: No icterus. Mouth: Oropharyngeal mucosa moist and pink , no lesions erythema or exudate. Lungs: Clear to auscultation bilaterally.  Heart: Regular rate and rhythm, no murmurs rubs or gallops.  Abdomen: Bowel sounds are normal, Abdomen is slightly distended but soft. Tenderness with palpation of the umbilical hernia. Easily reducible. Mild warmth and tenderness to the right of the umbilicus. Overall cellulitis appears much better.    Extremities: No lower extremity edema. No clubbing or deformities. Neuro: Alert and oriented x 4   Skin: Warm and dry, no jaundice.   Psych: Alert and cooperative, normal mood and affect.  Labs:  Lab Results  Component Value Date   CREATININE 10.55* 10/08/2015   BUN 88* 10/08/2015   NA 131* 10/08/2015   K 3.8 10/08/2015   CL 91* 10/08/2015   CO2 19* 10/08/2015   Lab Results  Component Value Date   ALT 31 10/08/2015   AST 82* 10/08/2015   ALKPHOS 113 10/08/2015   BILITOT 1.5* 10/08/2015   Lab Results  Component Value Date   INR 1.11 10/08/2015   INR 1.24 08/21/2015   INR 1.23 06/07/2015   Lab Results  Component Value Date   WBC 3.9* 10/08/2015   HGB 13.3 10/08/2015    HCT 39.0 10/08/2015   MCV 96.3 10/08/2015   PLT 78* 10/08/2015    Imaging Studies: Dg Chest 2 View  10/08/2015  CLINICAL DATA:  Productive cough off and on for 1 month, history of pneumonia, former smoker, cirrhosis, end-stage renal disease on dialysis EXAM: CHEST  2 VIEW COMPARISON:  06/04/2015 FINDINGS: Normal heart size, mediastinal contours, and pulmonary vascularity. Atherosclerotic calcification aorta. Minimal thickening or fluid at base of major fissure on lateral view. Lungs otherwise clear. No additional pleural effusion or pneumothorax. Bones unremarkable. IMPRESSION: No significant abnormalities. Electronically Signed   By: Lavonia Dana M.D.   On: 10/08/2015 12:30

## 2015-10-24 ENCOUNTER — Encounter: Payer: Self-pay | Admitting: Gastroenterology

## 2015-10-24 NOTE — Assessment & Plan Note (Signed)
Mild persisting abdominal wall cellulitis. Stop clindamycin. Doxycycline 100 mg twice a day for 10 days. Take with food.

## 2015-10-24 NOTE — Progress Notes (Signed)
CC'ED TO PCP 

## 2015-10-24 NOTE — Assessment & Plan Note (Signed)
Labs from February indicate MELD score of 26. [place] screening up-to-date. Patient has appointment on April 10 with Dr. Margaretha Seeds, liver transplant clinic at [facility]. Unfortunately the patient has has issues with compliance. She is in dialysis Tuesday Thursdays and Saturdays. She has a ride for appointments on Wednesdays and Fridays only. Patient is overdue for EGD with esophageal band ligation. She again wants to postpone today. I will request labs from the [facility] dialysis for review. He is concerned about muscle cramps which is most likely electrolyte abnormality. Further recommendations to follow.

## 2015-10-29 ENCOUNTER — Telehealth: Payer: Self-pay

## 2015-10-29 NOTE — Telephone Encounter (Signed)
Pt called and is wondering when she will need to follow up with [place] about the antibiotics she was given.  Please advise

## 2015-11-01 ENCOUNTER — Other Ambulatory Visit: Payer: Self-pay

## 2015-11-01 NOTE — Telephone Encounter (Signed)
Pt called back and was informed. She still expresses concern that she might need more antibiotics before she has that appt on 11/18/2015.  She said she has 2 more days of antibiotics and she still has some redness and she is just concerned that she might need more.  I told her I can let Neil Crouch, PA know, but I might not hear back until Monday from her.  She that is fine.

## 2015-11-01 NOTE — Telephone Encounter (Signed)
Tried to call pt. Not available and could not leave a message.

## 2015-11-01 NOTE — Telephone Encounter (Signed)
She really needs to keep her appointment with Dr. Ahmed Prima at [facility] liver clinic on 4/10 as scheduled.

## 2015-11-01 NOTE — Telephone Encounter (Signed)
OPENED IN ERROR

## 2015-11-04 MED ORDER — DOXYCYCLINE HYCLATE 100 MG PO CAPS: 100.0000 mg | ORAL_CAPSULE | Freq: Two times a day (BID) | ORAL | Status: DC

## 2015-11-04 NOTE — Telephone Encounter (Signed)
Tried to call, not accepting calls at this time.

## 2015-11-04 NOTE — Telephone Encounter (Signed)
Extend doxycyline for 7 more days.

## 2015-11-05 ENCOUNTER — Encounter (HOSPITAL_COMMUNITY): Payer: Self-pay | Admitting: *Deleted

## 2015-11-05 ENCOUNTER — Inpatient Hospital Stay (HOSPITAL_COMMUNITY)
Admission: EM | Admit: 2015-11-05 | Discharge: 2015-11-07 | DRG: 602 | Disposition: A | Discharge status: Home / Self Care | Payer: Medicare Other | Attending: Internal Medicine | Admitting: Internal Medicine

## 2015-11-05 DIAGNOSIS — D696 Thrombocytopenia, unspecified: Secondary | ICD-10-CM | POA: Diagnosis present

## 2015-11-05 DIAGNOSIS — G894 Chronic pain syndrome: Secondary | ICD-10-CM | POA: Diagnosis present

## 2015-11-05 DIAGNOSIS — K7460 Unspecified cirrhosis of liver: Secondary | ICD-10-CM | POA: Diagnosis present

## 2015-11-05 DIAGNOSIS — F319 Bipolar disorder, unspecified: Secondary | ICD-10-CM | POA: Diagnosis present

## 2015-11-05 DIAGNOSIS — K7581 Nonalcoholic steatohepatitis (NASH): Secondary | ICD-10-CM | POA: Diagnosis present

## 2015-11-05 DIAGNOSIS — N186 End stage renal disease: Secondary | ICD-10-CM | POA: Diagnosis present

## 2015-11-05 DIAGNOSIS — K7031 Alcoholic cirrhosis of liver with ascites: Secondary | ICD-10-CM

## 2015-11-05 DIAGNOSIS — L03311 Cellulitis of abdominal wall: Secondary | ICD-10-CM | POA: Diagnosis not present

## 2015-11-05 DIAGNOSIS — Z8249 Family history of ischemic heart disease and other diseases of the circulatory system: Secondary | ICD-10-CM

## 2015-11-05 DIAGNOSIS — F419 Anxiety disorder, unspecified: Secondary | ICD-10-CM | POA: Diagnosis present

## 2015-11-05 DIAGNOSIS — K3189 Other diseases of stomach and duodenum: Secondary | ICD-10-CM | POA: Diagnosis present

## 2015-11-05 DIAGNOSIS — Z8711 Personal history of peptic ulcer disease: Secondary | ICD-10-CM

## 2015-11-05 DIAGNOSIS — E871 Hypo-osmolality and hyponatremia: Secondary | ICD-10-CM | POA: Diagnosis present

## 2015-11-05 DIAGNOSIS — K219 Gastro-esophageal reflux disease without esophagitis: Secondary | ICD-10-CM | POA: Diagnosis present

## 2015-11-05 DIAGNOSIS — I8500 Esophageal varices without bleeding: Secondary | ICD-10-CM | POA: Diagnosis present

## 2015-11-05 DIAGNOSIS — K766 Portal hypertension: Secondary | ICD-10-CM | POA: Diagnosis present

## 2015-11-05 DIAGNOSIS — I953 Hypotension of hemodialysis: Secondary | ICD-10-CM | POA: Diagnosis present

## 2015-11-05 DIAGNOSIS — D631 Anemia in chronic kidney disease: Secondary | ICD-10-CM | POA: Diagnosis present

## 2015-11-05 DIAGNOSIS — R109 Unspecified abdominal pain: Secondary | ICD-10-CM | POA: Diagnosis not present

## 2015-11-05 DIAGNOSIS — Z992 Dependence on renal dialysis: Secondary | ICD-10-CM

## 2015-11-05 DIAGNOSIS — Z8701 Personal history of pneumonia (recurrent): Secondary | ICD-10-CM

## 2015-11-05 DIAGNOSIS — Z87891 Personal history of nicotine dependence: Secondary | ICD-10-CM

## 2015-11-05 LAB — COMPREHENSIVE METABOLIC PANEL
ALBUMIN: 3.4 g/dL — AB (ref 3.5–5.0)
ALT: 21 U/L (ref 14–54)
ANION GAP: 16 — AB (ref 5–15)
AST: 46 U/L — AB (ref 15–41)
Alkaline Phosphatase: 129 U/L — ABNORMAL HIGH (ref 38–126)
BUN: 116 mg/dL — ABNORMAL HIGH (ref 6–20)
CHLORIDE: 95 mmol/L — AB (ref 101–111)
CO2: 19 mmol/L — ABNORMAL LOW (ref 22–32)
Calcium: 8.4 mg/dL — ABNORMAL LOW (ref 8.9–10.3)
Creatinine, Ser: 8.73 mg/dL — ABNORMAL HIGH (ref 0.44–1.00)
GFR, EST AFRICAN AMERICAN: 6 mL/min — AB (ref >60)
GFR, EST NON AFRICAN AMERICAN: 5 mL/min — AB (ref >60)
Glucose, Bld: 102 mg/dL — ABNORMAL HIGH (ref 65–99)
POTASSIUM: 4.9 mmol/L (ref 3.5–5.1)
Sodium: 130 mmol/L — ABNORMAL LOW (ref 135–145)
Total Bilirubin: 1.5 mg/dL — ABNORMAL HIGH (ref 0.3–1.2)
Total Protein: 7.6 g/dL (ref 6.5–8.1)

## 2015-11-05 LAB — CBC
HEMATOCRIT: 35.9 % — AB (ref 36.0–46.0)
Hemoglobin: 12.2 g/dL (ref 12.0–15.0)
MCH: 33.6 pg (ref 26.0–34.0)
MCHC: 34 g/dL (ref 30.0–36.0)
MCV: 98.9 fL (ref 78.0–100.0)
Platelets: 90 10*3/uL — ABNORMAL LOW (ref 150–400)
RBC: 3.63 MIL/uL — AB (ref 3.87–5.11)
RDW: 15.2 % (ref 11.5–15.5)
WBC: 6.6 10*3/uL (ref 4.0–10.5)

## 2015-11-05 LAB — URINALYSIS, ROUTINE W REFLEX MICROSCOPIC
Glucose, UA: NEGATIVE mg/dL
HGB URINE DIPSTICK: NEGATIVE
KETONES UR: NEGATIVE mg/dL
Leukocytes, UA: NEGATIVE
NITRITE: NEGATIVE
Protein, ur: NEGATIVE mg/dL
SPECIFIC GRAVITY, URINE: 1.025 (ref 1.005–1.030)
pH: 5 (ref 5.0–8.0)

## 2015-11-05 LAB — I-STAT CG4 LACTIC ACID, ED: LACTIC ACID, VENOUS: 1.59 mmol/L (ref 0.5–2.0)

## 2015-11-05 LAB — LIPASE, BLOOD: LIPASE: 109 U/L — AB (ref 11–51)

## 2015-11-05 MED ORDER — FENTANYL CITRATE (PF) 100 MCG/2ML IJ SOLN
50.0000 ug | Freq: Once | INTRAMUSCULAR | Status: AC
  Administered 2015-11-05: 50 ug via INTRAVENOUS
  Filled 2015-11-05: qty 2

## 2015-11-05 MED ORDER — DEXTROSE 5 % IV SOLN
1.0000 g | Freq: Once | INTRAVENOUS | Status: AC
  Administered 2015-11-05: 1 g via INTRAVENOUS
  Filled 2015-11-05: qty 10

## 2015-11-05 MED ORDER — VANCOMYCIN HCL IN DEXTROSE 1-5 GM/200ML-% IV SOLN
1000.0000 mg | Freq: Once | INTRAVENOUS | Status: AC
  Administered 2015-11-06: 1000 mg via INTRAVENOUS
  Filled 2015-11-05: qty 200

## 2015-11-05 NOTE — ED Provider Notes (Addendum)
By signing my name below, I, Rowan Blase, attest that this documentation has been prepared under the direction and in the presence of Ruby, DO . Electronically Signed: Rowan Blase, Scribe. 11/05/2015. 11:53 PM.  TIME SEEN: 11:29 PM  CHIEF COMPLAINT: Abdominal Pain  HPI: HPI Comments:  Wanda [name] is a 40 y.o. female with PMHx of GERD, cirrhosis from NASH and end stage renal disease on HD T/Th/Sat who presents to the Emergency Department complaining of chronic, swollen, painful erythematous rash to abdomen for the past three months. She states pain and swelling began worsening four days ago. She states she has been taking Doxycycline since March 15 for cellulitis on abdomen. Pt finished antibiotic two days ago, refilled the prescription, and took another pill this morning; she states there has been no improvement with antibiotics. She feels like despite being on antibiotics she is getting worse. Pt reports her last dialysis was 5 days ago and her last paracentesis was January 6. Pt denies fever or vomiting. States she has had chills and nausea. Also has had some diarrhea.  ROS: See HPI Constitutional: no fever  Eyes: no drainage  ENT: no runny nose   Cardiovascular:  no chest pain  Resp: no SOB  GI: no vomiting GU: no dysuria Integumentary: rash  Allergy: no hives  Musculoskeletal: no leg swelling  Neurological: no slurred speech ROS otherwise negative  PAST MEDICAL HISTORY/PAST SURGICAL HISTORY:  Past Medical History  Diagnosis Date  . GERD (gastroesophageal reflux disease)   . Cirrhosis ([place]) 10/05/13    Liver bx 11/23/13 (delayed initially due to patient refusal). c/w steatohepatitis  . Folate deficiency 09/2013  . Anasarca 10/10/2013  . Hematemesis/vomiting blood 02/24/2014  . Acute blood loss anemia 02/25/2014    Status post transfusion  . Macrocytosis 02/28/2014  . Bleeding esophageal varices ([place]) 02/28/2014    s/p banding  . Acute renal failure ([place]) 09/2013     Pre-renal- resolved  . C. difficile colitis 04/19/2014  . Anxiety   . Depression   . Thrombocytopenia ([place])     Hypercellular bone marrow; abundant megakaryocytes per 08/27/2014; s/p bone marrow bx  . Cirrhosis of liver with ascites ([place])   . SBP (spontaneous bacterial peritonitis) ([place]) 11/10/2013  . Bipolar disorder ([place]) 12/04/2013    2007-SEEN IN ED FOR INVOLUNTARY COMMITMENT, UDS POS FOR AMPHETAMINES/OPIATES   . PNA (pneumonia) 10/13/2013  . Chronic hypotension   . Gastroesophageal junction ulcer 09/17/2014  . ESRD (end stage renal disease) on dialysis ([place]) 08/2014    MEDICATIONS:  Prior to Admission medications   Medication Sig Start Date End Date Taking? Authorizing Provider  calcium acetate (PHOSLO) 667 MG capsule Take 667 mg by mouth 3 (three) times daily with meals. 10/10/15  Yes Historical Provider, MD  Darbepoetin Alfa (ARANESP) 60 MCG/0.3ML SOSY injection Inject 0.3 mLs (60 mcg total) into the vein every Thursday with hemodialysis. 06/09/15  Yes Cherene Altes, MD  doxycycline (VIBRAMYCIN) 100 MG capsule Take 1 capsule (100 mg total) by mouth 2 (two) times daily. Take with food Patient taking differently: Take 100 mg by mouth 2 (two) times daily. Take with food for 10 days starting on 11/04/2015 11/04/15  Yes Mahala Menghini, PA-C  lactulose [facility]) 10 GM/15ML solution TAKE 3 TEASPOONSFUL (15ML) BY MOUTH TWICE DAILY 07/22/15  Yes Mahala Menghini, PA-C  lidocaine (XYLOCAINE) 2 % solution TAKE TWO TEASPOONSFUL (10ML) BY MOUTH BEFORE MEALS AND AT BEDTIME TO PREVENT CHEST PAIN WHILE EATING Patient taking differently: Take  10 mLs by mouth 3 (three) times daily as needed (TAKE TWO TEASPOONSFUL (10ML) BY MOUTH BEFORE MEALS AND AT BEDTIME TO PREVENT CHEST PAIN WHILE EATING). TAKE TWO TEASPOONSFUL (10ML) BY MOUTH BEFORE MEALS AND AT BEDTIME TO PREVENT CHEST PAIN WHILE EATING 08/19/15  Yes Carlis Stable, NP  lidocaine-prilocaine (EMLA) cream Apply 1 application topically See admin instructions.  Applied prior to port access 05/03/15  Yes Historical Provider, MD  LORazepam (ATIVAN) 1 MG tablet Take 0.5-1 tablets (0.5-1 mg total) by mouth 2 (two) times daily as needed (FOR BACK SPASM OR ANXIETY). FOR BACK SPASM OR ANXIETY 09/17/15  Yes Orvil Feil, NP  midodrine (PROAMATINE) 10 MG tablet Take 1 tablet (10 mg total) by mouth daily. *Takes only on dialysis days on Tuesdays, Thursdays, and Saturdays (Sometimes takes treatments on Friday instead of Saturday)**Medication to keep your blood pressure from falling. 06/09/15  Yes Cherene Altes, MD  Oxycodone HCl 10 MG TABS 1 PO TID AS NEEDED FOR PAIN Patient taking differently: Take 10 mg by mouth 3 (three) times daily as needed (for pain). FOR PAIN 09/10/15  Yes Danie Binder, MD  promethazine (PHENERGAN) 12.5 MG tablet TAKE ONE TABLET BY MOUTH EVERY 6 HOURS AS NEEDED FOR NAUSEA AND VOMITING. 09/06/15  Yes Mahala Menghini, PA-C  RENVELA 800 MG tablet TAKE TWO (2) TABLETS BY MOUTH THREE TIMES DAILY WITH MEALS AND TAKE ONE TABLET BY MOUTH WITH SNACKS. 10/29/15  Yes Historical Provider, MD  sucralfate (CARAFATE) 1 GM/10ML suspension Take 10 mLs (1 g total) by mouth 4 (four) times daily. Patient taking differently: Take 1 g by mouth 4 (four) times daily as needed (for stomach/relief).  07/10/15  Yes Danie Binder, MD  pantoprazole (PROTONIX) 40 MG tablet Take 1 tablet (40 mg total) by mouth 2 (two) times daily. Patient not taking: Reported on 11/05/2015 09/19/14   Kathie Dike, MD    ALLERGIES:  Allergies  Allergen Reactions  . Lasix [Furosemide]     "doesn't work"  . Latex Itching  . Morphine And Related Hives and Itching    SOCIAL HISTORY:  Social History  Substance Use Topics  . Smoking status: Former Smoker -- 0.25 packs/day for 20 years    Types: Cigarettes    Quit date: 11/05/2008  . Smokeless tobacco: Never Used  . Alcohol Use: No    FAMILY HISTORY: Family History  Problem Relation Age of Onset  . Heart disease Mother   . Colon  cancer Neg Hx   . Liver disease Neg Hx     EXAM: BP 114/78 mmHg  Pulse 85  Temp(Src) 98.7 F (37.1 C) (Oral)  Resp 20  Ht 5' (1.524 m)  Wt 150 lb (68.04 kg)  BMI 29.30 kg/m2  SpO2 100%  LMP 09/19/2013 CONSTITUTIONAL: Alert and oriented and responds appropriately to questions. Chronically ill appearing; appears uncomfortable; well-nourished HEAD: Normocephalic EYES: Conjunctivae clear, PERRL ENT: normal nose; no rhinorrhea; moist mucous membranes NECK: Supple, no meningismus, no LAD  CARD: RRR; S1 and S2 appreciated; no murmurs, no clicks, no rubs, no gallops RESP: Normal chest excursion without splinting or tachypnea; breath sounds clear and equal bilaterally; no wheezes, no rhonchi, no rales, no hypoxia or respiratory distress, speaking full sentences ABD/GI: Normal bowel sounds; distented with fluid wave; soft; diffusely tender with involuntary guarding, no rebound, no peritoneal signs BACK:  The back appears normal and is non-tender to palpation, there is no CVA tenderness EXT: Normal ROM in all joints; non-tender to palpation; no  edema; normal capillary refill; no cyanosis, no calf tenderness or swelling    SKIN: Normal color for age and race; warm; erythema and warmth from suprapubic area to area of umbilicus BL NEURO: Moves all extremities equally, sensation to light touch intact diffusely, cranial nerves II through XII intact PSYCH: The patient's mood and manner are appropriate. Grooming and personal hygiene are appropriate.  MEDICAL DECISION MAKING:  Patient here with area of abdominal wall cellulitis. She's had this intermittently since January and reports that she is getting worse despite oral antibiotics at home. His having chills and nausea. Also having increase swelling to her abdomen and feels that she may need another paracentesis but states she's been told that she cannot have one until her cellulitis has improved. No fever in the emergency department. Hemodynamically  stable. She states that she feels Keflex is helped her in the past. We'll give her vancomycin and ceftriaxone. Labs show new leukocytosis. LFTs appear to be at baseline. Lipase is mildly elevated. She does have a metabolic acidosis from uremia. She does not appear volume overloaded and has no hypoxia or shortness of breath. Her lungs are clear to auscultation. No lower extremity swelling. She is not extremely hypertensive. Her potassium is 4.9. I do not feel she needs emergent dialysis. Lactate is normal. We'll give IV pain medication and admit. PCP is with Rev[facility] B[facility].   ED PROGRESS: 12:30 AM  Discussed patient's case with hospitalist, Dr. Shanon Brow.  Recommend admission to inpatient, medical bed.  I will place holding orders per their request. Patient and family (if present) updated with plan. Care transferred to hospitalist service.   I was asked by nursing staff to reevaluate patient. She had a slight drop in her blood pressure. This is likely secondary to receiving IV narcotics. No significant change in her heart rate and no significant change in her symptoms. We'll give a small 500 mL IV fluid bolus. We'll hydrate cautiously as she is on hemodialysis.   7:00 AM  Patient's blood pressure remains low despite 750 mg IV fluid bolus. Hospitalist Dr. Shanon Brow has been updated and wants to keep patient on a medical bed. No beds available at this time. Patient is holding in the emergency department. No change in orders at this time. Heart rate is normal in the 70s. Patient does not feel lightheaded. She has appropriate size blood pressure cough on at this time. Her blood pressure dropped after receiving IV narcotics. Holding further narcotics at this time.  Patient is being managed by hospitalist service.  She has received vancomycin and ceftriaxone. Blood cultures are pending. Repeat lactate is still normal.  I reviewed all nursing notes, vitals, pertinent old records, labs, imaging (as  available).       CRITICAL CARE Performed by: Nyra Jabs   Total critical care time: 35 minutes  Critical care time was exclusive of separately billable procedures and treating other patients.  Critical care was necessary to treat or prevent imminent or life-threatening deterioration.  Critical care was time spent personally by me on the following activities: development of treatment plan with patient and/or surrogate as well as nursing, discussions with consultants, evaluation of patient's response to treatment, examination of patient, obtaining history from patient or surrogate, ordering and performing treatments and interventions, ordering and review of laboratory studies, ordering and review of radiographic studies, pulse oximetry and re-evaluation of patient's condition.   I personally performed the services described in this documentation, which was scribed in my presence. The recorded information  has been reviewed and is accurate.        [place], DO 11/06/15 [phone]

## 2015-11-05 NOTE — ED Notes (Signed)
Pt c/o abdominal pain, denies any  V/d has some diarrhea

## 2015-11-05 NOTE — Telephone Encounter (Signed)
Pt called and was informed. Said she had just found out about the prescription at the pharmacy. I told her I tried to call her and could not leave a message. She is concerned because she finished the other Doxycycline and was still having some problems and she started taking Clindamycin that she had left over previously. She was taking that previously from Sunday.  I spoke to Neil Crouch, PA and she said to tell pt to STOP CLINDAMYCIN and start Doxycycline.  Pt is aware.

## 2015-11-06 ENCOUNTER — Encounter (HOSPITAL_COMMUNITY): Payer: Self-pay | Admitting: *Deleted

## 2015-11-06 DIAGNOSIS — K7460 Unspecified cirrhosis of liver: Secondary | ICD-10-CM | POA: Diagnosis present

## 2015-11-06 DIAGNOSIS — K766 Portal hypertension: Secondary | ICD-10-CM | POA: Diagnosis present

## 2015-11-06 DIAGNOSIS — L03311 Cellulitis of abdominal wall: Secondary | ICD-10-CM | POA: Diagnosis present

## 2015-11-06 DIAGNOSIS — Z8249 Family history of ischemic heart disease and other diseases of the circulatory system: Secondary | ICD-10-CM | POA: Diagnosis not present

## 2015-11-06 DIAGNOSIS — G894 Chronic pain syndrome: Secondary | ICD-10-CM | POA: Diagnosis not present

## 2015-11-06 DIAGNOSIS — D631 Anemia in chronic kidney disease: Secondary | ICD-10-CM | POA: Diagnosis present

## 2015-11-06 DIAGNOSIS — K7031 Alcoholic cirrhosis of liver with ascites: Secondary | ICD-10-CM

## 2015-11-06 DIAGNOSIS — Z992 Dependence on renal dialysis: Secondary | ICD-10-CM | POA: Diagnosis not present

## 2015-11-06 DIAGNOSIS — D696 Thrombocytopenia, unspecified: Secondary | ICD-10-CM | POA: Diagnosis present

## 2015-11-06 DIAGNOSIS — R109 Unspecified abdominal pain: Secondary | ICD-10-CM | POA: Diagnosis present

## 2015-11-06 DIAGNOSIS — Z8711 Personal history of peptic ulcer disease: Secondary | ICD-10-CM | POA: Diagnosis not present

## 2015-11-06 DIAGNOSIS — K7581 Nonalcoholic steatohepatitis (NASH): Secondary | ICD-10-CM | POA: Diagnosis present

## 2015-11-06 DIAGNOSIS — F419 Anxiety disorder, unspecified: Secondary | ICD-10-CM | POA: Diagnosis present

## 2015-11-06 DIAGNOSIS — K3189 Other diseases of stomach and duodenum: Secondary | ICD-10-CM | POA: Diagnosis not present

## 2015-11-06 DIAGNOSIS — Z87891 Personal history of nicotine dependence: Secondary | ICD-10-CM | POA: Diagnosis not present

## 2015-11-06 DIAGNOSIS — E871 Hypo-osmolality and hyponatremia: Secondary | ICD-10-CM | POA: Diagnosis present

## 2015-11-06 DIAGNOSIS — N186 End stage renal disease: Secondary | ICD-10-CM | POA: Diagnosis not present

## 2015-11-06 DIAGNOSIS — F319 Bipolar disorder, unspecified: Secondary | ICD-10-CM | POA: Diagnosis present

## 2015-11-06 DIAGNOSIS — K219 Gastro-esophageal reflux disease without esophagitis: Secondary | ICD-10-CM | POA: Diagnosis present

## 2015-11-06 DIAGNOSIS — Z8701 Personal history of pneumonia (recurrent): Secondary | ICD-10-CM | POA: Diagnosis not present

## 2015-11-06 DIAGNOSIS — I953 Hypotension of hemodialysis: Secondary | ICD-10-CM | POA: Diagnosis present

## 2015-11-06 LAB — BASIC METABOLIC PANEL
Anion gap: 17 — ABNORMAL HIGH (ref 5–15)
BUN: 116 mg/dL — ABNORMAL HIGH (ref 6–20)
CALCIUM: 8.2 mg/dL — AB (ref 8.9–10.3)
CO2: 17 mmol/L — ABNORMAL LOW (ref 22–32)
CREATININE: 8.69 mg/dL — AB (ref 0.44–1.00)
Chloride: 97 mmol/L — ABNORMAL LOW (ref 101–111)
GFR, EST AFRICAN AMERICAN: 6 mL/min — AB (ref >60)
GFR, EST NON AFRICAN AMERICAN: 5 mL/min — AB (ref >60)
Glucose, Bld: 94 mg/dL (ref 65–99)
Potassium: 4.8 mmol/L (ref 3.5–5.1)
SODIUM: 131 mmol/L — AB (ref 135–145)

## 2015-11-06 LAB — CBC
HCT: 33 % — ABNORMAL LOW (ref 36.0–46.0)
Hemoglobin: 11.1 g/dL — ABNORMAL LOW (ref 12.0–15.0)
MCH: 33.2 pg (ref 26.0–34.0)
MCHC: 33.6 g/dL (ref 30.0–36.0)
MCV: 98.8 fL (ref 78.0–100.0)
Platelets: 75 10*3/uL — ABNORMAL LOW (ref 150–400)
RBC: 3.34 MIL/uL — AB (ref 3.87–5.11)
RDW: 15.2 % (ref 11.5–15.5)
WBC: 5 10*3/uL (ref 4.0–10.5)

## 2015-11-06 LAB — I-STAT CG4 LACTIC ACID, ED: Lactic Acid, Venous: 1 mmol/L (ref 0.5–2.0)

## 2015-11-06 LAB — LACTIC ACID, PLASMA
LACTIC ACID, VENOUS: 1.1 mmol/L (ref 0.5–2.0)
Lactic Acid, Venous: 1 mmol/L (ref 0.5–2.0)

## 2015-11-06 MED ORDER — SODIUM CHLORIDE 0.9% FLUSH: 3.0000 mL | INTRAVENOUS | Status: DC | PRN

## 2015-11-06 MED ORDER — OXYCODONE HCL 5 MG PO TABS
10.0000 mg | ORAL_TABLET | Freq: Three times a day (TID) | ORAL | Status: DC | PRN
  Administered 2015-11-06 – 2015-11-07 (×2): 10 mg via ORAL
  Filled 2015-11-06 (×3): qty 2

## 2015-11-06 MED ORDER — DEXTROSE 5 % IV SOLN
2.0000 g | INTRAVENOUS | Status: DC
Start: 2015-11-07 — End: 2015-11-07
  Administered 2015-11-07: 2 g via INTRAVENOUS
  Filled 2015-11-06 (×2): qty 2

## 2015-11-06 MED ORDER — VANCOMYCIN HCL 500 MG IV SOLR
500.0000 mg | INTRAVENOUS | Status: AC
  Administered 2015-11-06: 500 mg via INTRAVENOUS
  Filled 2015-11-06: qty 500

## 2015-11-06 MED ORDER — ONDANSETRON HCL 4 MG/2ML IJ SOLN: 4.0000 mg | Freq: Four times a day (QID) | INTRAMUSCULAR | Status: DC | PRN

## 2015-11-06 MED ORDER — ALUM & MAG HYDROXIDE-SIMETH 200-200-20 MG/5ML PO SUSP
15.0000 mL | Freq: Four times a day (QID) | ORAL | Status: DC | PRN
  Filled 2015-11-06: qty 30

## 2015-11-06 MED ORDER — LIDOCAINE HCL (PF) 1 % IJ SOLN
5.0000 mL | INTRAMUSCULAR | Status: DC | PRN
  Administered 2015-11-06: 0.5 mL via INTRADERMAL

## 2015-11-06 MED ORDER — MIDODRINE HCL 5 MG PO TABS
10.0000 mg | ORAL_TABLET | Freq: Once | ORAL | Status: AC
  Administered 2015-11-06: 10 mg via ORAL
  Filled 2015-11-06: qty 2

## 2015-11-06 MED ORDER — EPOETIN ALFA 4000 UNIT/ML IJ SOLN
INTRAMUSCULAR | Status: AC
  Administered 2015-11-06: 2000 [IU]
  Filled 2015-11-06: qty 1

## 2015-11-06 MED ORDER — VANCOMYCIN HCL 500 MG IV SOLR
500.0000 mg | INTRAVENOUS | Status: DC
  Filled 2015-11-06: qty 500

## 2015-11-06 MED ORDER — SODIUM CHLORIDE 0.9 % IV SOLN: 250.0000 mL | INTRAVENOUS | Status: DC | PRN

## 2015-11-06 MED ORDER — SODIUM CHLORIDE 0.9 % IV SOLN: 100.0000 mL | INTRAVENOUS | Status: DC | PRN

## 2015-11-06 MED ORDER — ONDANSETRON HCL 4 MG PO TABS: 4.0000 mg | ORAL_TABLET | Freq: Four times a day (QID) | ORAL | Status: DC | PRN

## 2015-11-06 MED ORDER — MIDODRINE HCL 5 MG PO TABS
10.0000 mg | ORAL_TABLET | ORAL | Status: DC
  Filled 2015-11-06: qty 2

## 2015-11-06 MED ORDER — SEVELAMER CARBONATE 800 MG PO TABS
1600.0000 mg | ORAL_TABLET | Freq: Three times a day (TID) | ORAL | Status: DC
  Administered 2015-11-07: 1600 mg via ORAL
  Filled 2015-11-06 (×3): qty 2

## 2015-11-06 MED ORDER — FAMOTIDINE 20 MG PO TABS: 20.0000 mg | ORAL_TABLET | Freq: Two times a day (BID) | ORAL | Status: DC | PRN

## 2015-11-06 MED ORDER — LACTULOSE 10 GM/15ML PO SOLN
20.0000 g | Freq: Two times a day (BID) | ORAL | Status: DC
  Administered 2015-11-07: 20 g via ORAL
  Filled 2015-11-06 (×3): qty 30

## 2015-11-06 MED ORDER — ALTEPLASE 2 MG IJ SOLR: 2.0000 mg | Freq: Once | INTRAMUSCULAR | Status: DC | PRN

## 2015-11-06 MED ORDER — LIDOCAINE HCL (PF) 1 % IJ SOLN
INTRAMUSCULAR | Status: AC
Start: 2015-11-06 — End: 2015-11-06
  Administered 2015-11-06: 0.5 mL via INTRADERMAL
  Filled 2015-11-06: qty 5

## 2015-11-06 MED ORDER — PENTAFLUOROPROP-TETRAFLUOROETH EX AERO: 1.0000 "application " | INHALATION_SPRAY | CUTANEOUS | Status: DC | PRN

## 2015-11-06 MED ORDER — ALBUMIN HUMAN 25 % IV SOLN
12.5000 g | Freq: Once | INTRAVENOUS | Status: AC
  Administered 2015-11-06: 12.5 g via INTRAVENOUS

## 2015-11-06 MED ORDER — FENTANYL CITRATE (PF) 100 MCG/2ML IJ SOLN
12.5000 ug | INTRAMUSCULAR | Status: DC | PRN
Start: 2015-11-06 — End: 2015-11-06
  Administered 2015-11-06 (×2): 12.5 ug via INTRAVENOUS
  Filled 2015-11-06 (×2): qty 2

## 2015-11-06 MED ORDER — HEPARIN SODIUM (PORCINE) 1000 UNIT/ML DIALYSIS: 1000.0000 [IU] | INTRAMUSCULAR | Status: DC | PRN

## 2015-11-06 MED ORDER — LORAZEPAM 0.5 MG PO TABS
0.5000 mg | ORAL_TABLET | Freq: Two times a day (BID) | ORAL | Status: DC | PRN
Start: 2015-11-06 — End: 2015-11-07

## 2015-11-06 MED ORDER — LIDOCAINE-PRILOCAINE 2.5-2.5 % EX CREA: 1.0000 "application " | TOPICAL_CREAM | CUTANEOUS | Status: DC | PRN

## 2015-11-06 MED ORDER — FENTANYL CITRATE (PF) 100 MCG/2ML IJ SOLN
50.0000 ug | Freq: Once | INTRAMUSCULAR | Status: AC
Start: 2015-11-06 — End: 2015-11-06
  Administered 2015-11-06: 50 ug via INTRAVENOUS

## 2015-11-06 MED ORDER — EPOETIN ALFA 2000 UNIT/ML IJ SOLN
2000.0000 [IU] | Freq: Once | INTRAMUSCULAR | Status: AC
  Administered 2015-11-06: 2000 [IU] via SUBCUTANEOUS

## 2015-11-06 MED ORDER — FENTANYL CITRATE (PF) 100 MCG/2ML IJ SOLN
INTRAMUSCULAR | Status: AC
  Filled 2015-11-06: qty 2

## 2015-11-06 MED ORDER — SODIUM CHLORIDE 0.9% FLUSH
3.0000 mL | Freq: Two times a day (BID) | INTRAVENOUS | Status: DC
  Administered 2015-11-06 – 2015-11-07 (×2): 3 mL via INTRAVENOUS

## 2015-11-06 MED ORDER — ALBUMIN HUMAN 25 % IV SOLN
12.5000 g | Freq: Once | INTRAVENOUS | Status: AC
  Administered 2015-11-06: 12.5 g via INTRAVENOUS
  Filled 2015-11-06: qty 50

## 2015-11-06 MED ORDER — ALBUMIN HUMAN 25 % IV SOLN
INTRAVENOUS | Status: AC
Start: 2015-11-06 — End: 2015-11-06
  Administered 2015-11-06: 12.5 g via INTRAVENOUS
  Filled 2015-11-06: qty 50

## 2015-11-06 MED ORDER — DEXTROSE 5 % IV SOLN: 1.0000 g | INTRAVENOUS | Status: DC

## 2015-11-06 NOTE — Consult Note (Signed)
Wanda [name] MRN: [id number] DOB/AGE: 42-Sep-1977 42 y.o. Primary Care Physician:ROBERSON, [name], MD Admit date: 11/05/2015 Chief Complaint:  Chief Complaint  Patient presents with  . Abdominal Pain   HPI: Pt is 40 year old caucasian female with past medical hx of ESRD who came to ER with c/o abdominal pain ' HPI dates back to past few weeks as pt has been on several different abx for cellulitis,  With most recent being 10 days of doxycycline. This episode started of erythema worsening started about 3-4 days ago.  Pt gives no hx of recent paracentesis. Pt offes No fevers.  Pt offers No c/o  Nausea/vomiting/diarrhea. Pt is on dialysis Tues/Thurs/Sat schedule but, she did not get dialysis yesterday . Pt is currently admitted for treatment of her cellulitis.   Past Medical History  Diagnosis Date  . GERD (gastroesophageal reflux disease)   . Cirrhosis ([place]) 10/05/13    Liver bx 11/23/13 (delayed initially due to patient refusal). c/w steatohepatitis  . Folate deficiency 09/2013  . Anasarca 10/10/2013  . Hematemesis/vomiting blood 02/24/2014  . Acute blood loss anemia 02/25/2014    Status post transfusion  . Macrocytosis 02/28/2014  . Bleeding esophageal varices ([place]) 02/28/2014    s/p banding  . Acute renal failure ([place]) 09/2013    Pre-renal- resolved  . C. difficile colitis 04/19/2014  . Anxiety   . Depression   . Thrombocytopenia ([place])     Hypercellular bone marrow; abundant megakaryocytes per 08/27/2014; s/p bone marrow bx  . Cirrhosis of liver with ascites ([place])   . SBP (spontaneous bacterial peritonitis) ([place]) 11/10/2013  . Bipolar disorder ([place]) 12/04/2013    2007-SEEN IN ED FOR INVOLUNTARY COMMITMENT, UDS POS FOR AMPHETAMINES/OPIATES   . PNA (pneumonia) 10/13/2013  . Chronic hypotension   . Gastroesophageal junction ulcer 09/17/2014  . ESRD (end stage renal disease) on dialysis ([place]) 08/2014        Family History  Problem Relation Age of Onset  . Heart disease Mother   .  Colon cancer Neg Hx   . Liver disease Neg Hx     Social History:  reports that she quit smoking about 7 years ago. Her smoking use included Cigarettes. She has a 5 pack-year smoking history. She has never used smokeless tobacco. She reports that she does not drink alcohol or use illicit drugs.   Allergies:  Allergies  Allergen Reactions  . Lasix [Furosemide]     "doesn't work"  . Latex Itching  . Morphine And Related Hives and Itching    Medications Prior to Admission  Medication Sig Dispense Refill  . calcium acetate (PHOSLO) 667 MG capsule Take 667 mg by mouth 3 (three) times daily with meals.  0  . Darbepoetin Alfa (ARANESP) 60 MCG/0.3ML SOSY injection Inject 0.3 mLs (60 mcg total) into the vein every Thursday with hemodialysis. 4.2 mL   . doxycycline (VIBRAMYCIN) 100 MG capsule Take 1 capsule (100 mg total) by mouth 2 (two) times daily. Take with food (Patient taking differently: Take 100 mg by mouth 2 (two) times daily. Take with food for 10 days starting on 11/04/2015) 14 capsule 0  . lactulose (CHRONULAC) 10 GM/15ML solution TAKE 3 TEASPOONSFUL (15ML) BY MOUTH TWICE DAILY 946 mL 5  . lidocaine (XYLOCAINE) 2 % solution TAKE TWO TEASPOONSFUL (10ML) BY MOUTH BEFORE MEALS AND AT BEDTIME TO PREVENT CHEST PAIN WHILE EATING (Patient taking differently: Take 10 mLs by mouth 3 (three) times daily as needed (TAKE TWO TEASPOONSFUL (10ML) BY MOUTH BEFORE MEALS AND  AT BEDTIME TO PREVENT CHEST PAIN WHILE EATING). TAKE TWO TEASPOONSFUL (10ML) BY MOUTH BEFORE MEALS AND AT BEDTIME TO PREVENT CHEST PAIN WHILE EATING) 500 mL 0  . lidocaine-prilocaine (EMLA) cream Apply 1 application topically See admin instructions. Applied prior to port access  6  . LORazepam (ATIVAN) 1 MG tablet Take 0.5-1 tablets (0.5-1 mg total) by mouth 2 (two) times daily as needed (FOR BACK SPASM OR ANXIETY). FOR BACK SPASM OR ANXIETY 45 tablet 1  . midodrine (PROAMATINE) 10 MG tablet Take 1 tablet (10 mg total) by mouth daily.  *Takes only on dialysis days on Tuesdays, Thursdays, and Saturdays (Sometimes takes treatments on Friday instead of Saturday)**Medication to keep your blood pressure from falling. 60 tablet 3  . Oxycodone HCl 10 MG TABS 1 PO TID AS NEEDED FOR PAIN (Patient taking differently: Take 10 mg by mouth 3 (three) times daily as needed (for pain). FOR PAIN) 90 tablet 0  . promethazine (PHENERGAN) 12.5 MG tablet TAKE ONE TABLET BY MOUTH EVERY 6 HOURS AS NEEDED FOR NAUSEA AND VOMITING. 30 tablet 0  . RENVELA 800 MG tablet TAKE TWO (2) TABLETS BY MOUTH THREE TIMES DAILY WITH MEALS AND TAKE ONE TABLET BY MOUTH WITH SNACKS.  5  . sucralfate (CARAFATE) 1 GM/10ML suspension Take 10 mLs (1 g total) by mouth 4 (four) times daily. (Patient taking differently: Take 1 g by mouth 4 (four) times daily as needed (for stomach/relief). ) 420 mL 1  . pantoprazole (PROTONIX) 40 MG tablet Take 1 tablet (40 mg total) by mouth 2 (two) times daily. (Patient not taking: Reported on 11/05/2015) 60 tablet 2       [id number] from the symptoms mentioned above,there are no other symptoms referable to all systems reviewed.  Derrill Memo ON 11/07/2015] cefTRIAXone (ROCEPHIN)  IV  2 g Intravenous Q24H  . lactulose  20 g Oral BID  . midodrine  10 mg Oral Once  . [START ON 11/07/2015] midodrine  10 mg Oral Q T,Th,Sa-HD  . sevelamer carbonate  1,600 mg Oral TID WC  . sodium chloride flush  3 mL Intravenous Q12H     Physical Exam: Vital signs in last 24 hours: Temp:  [97.6 F (36.4 C)-98.7 F (37.1 C)] 97.6 F (36.4 C) (03/29 1027) Pulse Rate:  [71-85] 75 (03/29 1027) Resp:  [13-20] 16 (03/29 1027) BP: (78-114)/(43-78) 97/50 mmHg (03/29 1027) SpO2:  [100 %] 100 % (03/29 1027) Weight:  [150 lb (68.04 kg)] 150 lb (68.04 kg) (03/28 2131) Weight change:     Intake/Output from previous day:       Physical Exam: General- pt is awake,alert, oriented to time place and person Resp- No acute REsp distress, decreased at bases CVS- S1S2  regular in rate and rhythm GIT- BS+, soft, umbilical hernia, tenderness over erythema site EXT- 2+ LE Edema,NO Cyanosis CNS- CN 2-12 grossly intact. Moving all 4 extremities Psych- normal mood and affect Access-  AVF   Lab Results: CBC  Recent Labs  11/05/15 2225 11/06/15 0541  WBC 6.6 5.0  HGB 12.2 11.1*  HCT 35.9* 33.0*  PLT 90* 75*    BMET  Recent Labs  11/05/15 2225 11/06/15 0541  NA 130* 131*  K 4.9 4.8  CL 95* 97*  CO2 19* 17*  GLUCOSE 102* 94  BUN 116* 116*  CREATININE 8.73* 8.69*  CALCIUM 8.4* 8.2*    MICRO Recent Results (from the past 240 hour(s))  Blood culture (routine x 2)     Status: None (Preliminary  result)   Collection Time: 11/06/15 12:02 AM  Result Value Ref Range Status   Specimen Description BLOOD  Final   Special Requests NONE  Final   Culture NO GROWTH < 12 HOURS  Final   Report Status PENDING  Incomplete  Blood culture (routine x 2)     Status: None (Preliminary result)   Collection Time: 11/06/15 12:07 AM  Result Value Ref Range Status   Specimen Description BLOOD  Final   Special Requests NONE  Final   Culture NO GROWTH < 12 HOURS  Final   Report Status PENDING  Incomplete      Lab Results  Component Value Date   CALCIUM 8.2* 11/06/2015   CAION 1.10* 05/14/2015   PHOS 5.0* 06/08/2015      Impression: 1)RenalESRD  Hf started on  08/18/14 Pt is on Tue/thurs/sat schedule Pt missed her Hd yesterday, Will dialyze today    2)CVS- Hemodynamically fragile  BP low  On Midodrine   3)Anemia IN ESRD the goal for HGb is  9--11 Pt hgb is just at goal  Will keep on low dose epo Hx of  GI bleed    4)Liver- Cirrhosis Sec to Steato hepatitis Primary team following  5)Electrolytes Normokalemia  Hyponatremia  Cirrhosis + ESRD   6)Id-admitted with abdominal wall cellulitis On IV Abc=x   7)Acid base Co2 at goal  8)  Thrombocytopenia-Stable Primary MD following    Plan:  Will dialzye today Will use 2 k bath Will keep on epo Will give albumin for hypotension during hd( pt bp already on lower side) Pt will most likely need hd again in am      Saundra Gin S 11/06/2015, 10:42 AM

## 2015-11-06 NOTE — Procedures (Signed)
   HEMODIALYSIS TREATMENT NOTE:  4 hour heparin-free dialysis ordered, initiated via right upper arm AVF (16g/antegrade). Goal NOT met: Unable to ultrafiltrate any fluid due to hypotension (SBP 80s, despite administration of Albumin 25g IV x1).  After discussion with Dr. Theador Hawthorne, UF was set to minimum rate of 300cc/hour.  Pt chose to end HD early/AMA after 3 hours due to need to use bathroom (refused bedpan and bedside commode - "It's too much trouble."). Vancomycin '500mg'$  was given at end of HD. All blood was returned and hemostasis was achieved within 15 minutes. Report called to Vista Deck, RN.  Total HD time = 3 hours Net UF = 0  Rockwell Alexandria, RN, CDN

## 2015-11-06 NOTE — Telephone Encounter (Signed)
Noted  

## 2015-11-06 NOTE — ED Notes (Signed)
Pt c/o abd pain and pulled muscle in r side of ribs.  Spoke with Dr. Tyrell Antonio and fentanyl ordered.  Small dose ordered due to pts low bp.

## 2015-11-06 NOTE — Progress Notes (Signed)
Reviewed labs from [facility] dialysis. No further labs needed at this time. Patient is currently inpatient.

## 2015-11-06 NOTE — H&P (Signed)
PCP:   [name], MD   Chief Complaint:  Redness to abdomen  HPI: 40 yo female h/o NASH cirrhosis with recurrent cellulitis to her abdominal wall on/off since jan of this year.  Pt reports she has been on several different abx most recent being 10 days of doxycycline which she completed 2 days ago.  The redness started to get worse about 3-4 days ago.  No recent paracentesis.  No fevers.  No n/v/d.  Pt is also ESRD on dialysis and gets dialysis T/R/Friday, she did not get dialysis yesterday due to pain she was having to abdomen.  Pt referred for admission for failing outpt abx treatment for her cellulitis.  Review of Systems:  Positive and negative as per HPI otherwise all other systems are negative  Past Medical History: Past Medical History  Diagnosis Date  . GERD (gastroesophageal reflux disease)   . Cirrhosis ([place]) 10/05/13    Liver bx 11/23/13 (delayed initially due to patient refusal). c/w steatohepatitis  . Folate deficiency 09/2013  . Anasarca 10/10/2013  . Hematemesis/vomiting blood 02/24/2014  . Acute blood loss anemia 02/25/2014    Status post transfusion  . Macrocytosis 02/28/2014  . Bleeding esophageal varices ([place]) 02/28/2014    s/p banding  . Acute renal failure ([place]) 09/2013    Pre-renal- resolved  . C. difficile colitis 04/19/2014  . Anxiety   . Depression   . Thrombocytopenia ([place])     Hypercellular bone marrow; abundant megakaryocytes per 08/27/2014; s/p bone marrow bx  . Cirrhosis of liver with ascites ([facility])   . SBP (spontaneous bacterial peritonitis) ([place]) 11/10/2013  . Bipolar disorder ([place]) 12/04/2013    2007-SEEN IN ED FOR INVOLUNTARY COMMITMENT, UDS POS FOR AMPHETAMINES/OPIATES   . PNA (pneumonia) 10/13/2013  . Chronic hypotension   . Gastroesophageal junction ulcer 09/17/2014  . ESRD (end stage renal disease) on dialysis ([place]) 08/2014   Past Surgical History  Procedure Laterality Date  . None    . Paracentesis  Feb 2015    1180 fluid, negative fluid  analysis.   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 Paracentesis  10/2013  . Colonoscopy N/A 12/19/2013    SLF:NO OBVIOUS SOURCE FOR ANEMIA IDETIFIED/ONE COLON POLYP REMOVED/Small internal hemorrhoids  . Esophagogastroduodenoscopy N/A 02/11/2014    Dr. Rourk:Esophageal varices with bleeding stigmata-status post esophageal band ligation therapy. Portal gastropathy  . Esophagogastroduodenoscopy N/A 07/04/2014    RMR: Persiting grade 2 esophageal varicies with bleeding stigmata status post band ligation. Significantly congested gastric mucosa iwith changes constistant with protal gastropathy.   . Esophageal banding  07/04/2014    Procedure: ESOPHAGEAL BANDING;  Surgeon: Daneil Dolin, MD;  Location: [facility];  Service: Endoscopy;;  . Esophagogastroduodenoscopy (egd) with propofol N/A 07/24/2014    SLF:  1. 2 columns grade 2-3 varices- 2 Bands applied.  2.  Moderate gastropathy 3. Duodenal Diverticula  . Esophageal banding N/A 07/24/2014    Procedure: ESOPHAGEAL BANDING (2 bands applied);  Surgeon: Danie Binder, MD;  Location: [facility] OR[facility];  Service: Endoscopy;  Laterality: N/A;  . Central venous catheter insertion Right   . Esophagogastroduodenoscopy N/A 09/16/2014    Rehman: Single short column of varix proximal to GE junction not large enough to be banded. Two amall ulcers at the GEJ felt to be source of GI Bleeding but no active bleeding but no actibe bleeding noted. No therapy rendered. Portal gastropathy NO  evidence of peptic ulcer diease or gastric varices.   . Esophagogastroduodenoscopy (egd) with propofol N/A 10/26/2014    RMR: 2 columns of grade 2 esophageal varices without obvious bleeding stigmata status post band ligation to complet obliteration of remaining varices.   . Av fistula placement Right 11/16/2014    Procedure: Right arm Creation of arteriovenous  fistula;  Surgeon: Angelia Mould, MD;  Location[facility] OR;  Service: Vascular;  Laterality: Right;  . Esophagogastroduodenoscopy N/A 12/14/2014    SLF: Grade ! esophageal varices. 2. Moderate Portal Gastropathy  . Esophagogastroduodenoscopy N/A 03/27/2015    Procedure: ESOPHAGOGASTRODUODENOSCOPY (EGD);  Surgeon: Danie Binder, MD;  [facility]: [facility];  Service: Endoscopy;  Laterality: N/A;  1045am - moved to 817 @ 11:30  . Esophagogastroduodenoscopy N/A 06/05/2015    Procedure: ESOPHAGOGASTRODUODENOSCOPY (EGD);  Surgeon: Clarene Essex, MD;  Location: [facility] ENDOSCOPY;  Service: Endoscopy;  Laterality: N/A;  . Esophagogastroduodenoscopy N/A 07/23/2015    SLF:1. Grade 1 esophageal varices 2. Moderate portal hypertensive gastropathy 3. MILd non-erosive gastritis.   . Esophageal banding N/A 07/23/2015    Procedure: ESOPHAGEAL BANDING;  Surgeon: Danie Binder, MD;  Location: [facility];  Service: Endoscopy;  Laterality: N/A;    Medications: Prior to Admission medications   Medication Sig Start Date End Date Taking? Authorizing Provider  calcium acetate (PHOSLO) 667 MG capsule Take 667 mg by mouth 3 (three) times daily with meals. 10/10/15  Yes Historical Provider, MD  Darbepoetin Alfa (ARANESP) 60 MCG/0.3ML SOSY injection Inject 0.3 mLs (60 mcg total) into the vein every Thursday with hemodialysis. 06/09/15  Yes Cherene Altes, MD  doxycycline (VIBRAMYCIN) 100 MG capsule Take 1 capsule (100 mg total) by mouth 2 (two) times daily. Take with food Patient taking differently: Take 100 mg by mouth 2 (two) times daily. Take with food for 10 days starting on 11/04/2015 11/04/15  Yes Mahala Menghini, PA-C  lactulose (CHRONULAC) 10 GM/15ML solution TAKE 3 TEASPOONSFUL (15ML) BY MOUTH TWICE DAILY 07/22/15  Yes Mahala Menghini, PA-C  lidocaine (XYLOCAINE) 2 % solution TAKE TWO TEASPOONSFUL (10ML) BY MOUTH BEFORE MEALS AND AT BEDTIME TO PREVENT CHEST PAIN WHILE EATING Patient taking differently: Take 10 mLs  by mouth 3 (three) times daily as needed (TAKE TWO TEASPOONSFUL (10ML) BY MOUTH BEFORE MEALS AND AT BEDTIME TO PREVENT CHEST PAIN WHILE EATING). TAKE TWO TEASPOONSFUL (10ML) BY MOUTH BEFORE MEALS AND AT BEDTIME TO PREVENT CHEST PAIN WHILE EATING 08/19/15  Yes Carlis Stable, NP  lidocaine-prilocaine (EMLA) cream Apply 1 application topically See admin instructions. Applied prior to port access 05/03/15  Yes Historical Provider, MD  LORazepam (ATIVAN) 1 MG tablet Take 0.5-1 tablets (0.5-1 mg total) by mouth 2 (two) times daily as needed (FOR BACK SPASM OR ANXIETY). FOR BACK SPASM OR ANXIETY 09/17/15  Yes Orvil Feil, NP  midodrine (PROAMATINE) 10 MG tablet Take 1 tablet (10 mg total) by mouth daily. *Takes only on dialysis days on Tuesdays, Thursdays, and Saturdays (Sometimes takes treatments on Friday instead of Saturday)**Medication to keep your blood pressure from falling. 06/09/15  Yes Cherene Altes, MD  Oxycodone HCl 10 MG TABS 1 PO TID AS NEEDED FOR PAIN Patient taking differently: Take 10 mg by mouth 3 (three) times daily as needed (for pain). FOR PAIN 09/10/15  Yes Danie Binder, MD  promethazine (PHENERGAN) 12.5 MG tablet TAKE ONE TABLET BY MOUTH EVERY 6 HOURS AS NEEDED FOR NAUSEA AND VOMITING. 09/06/15  Yes Mahala Menghini, PA-C  RENVELA 800 MG tablet TAKE TWO (2) TABLETS BY MOUTH THREE TIMES DAILY WITH MEALS AND TAKE ONE TABLET BY MOUTH WITH SNACKS. 10/29/15  Yes Historical Provider, MD  sucralfate (CARAFATE) 1 GM/10ML suspension Take 10 mLs (1 g total) by mouth 4 (four) times daily. Patient taking differently: Take 1 g by mouth 4 (four) times daily as needed (for stomach/relief).  07/10/15  Yes Danie Binder, MD  pantoprazole (PROTONIX) 40 MG tablet Take 1 tablet (40 mg total) by mouth 2 (two) times daily. Patient not taking: Reported on 11/05/2015 09/19/14   Kathie Dike, MD    Allergies:   Allergies  Allergen Reactions  . Lasix [Furosemide]     "doesn't work"  . Latex Itching  . Morphine  And Related Hives and Itching    Social History:  reports that she quit smoking about 7 years ago. Her smoking use included Cigarettes. She has a 5 pack-year smoking history. She has never used smokeless tobacco. She reports that she does not drink alcohol or use illicit drugs.  Family History: Family History  Problem Relation Age of Onset  . Heart disease Mother   . Colon cancer Neg Hx   . Liver disease Neg Hx     Physical Exam: Filed Vitals:   11/05/15 2131  BP: 114/78  Pulse: 85  Temp: 98.7 F (37.1 C)  TempSrc: Oral  Resp: 20  Height: 5' (1.524 m)  Weight: 68.04 kg (150 lb)  SpO2: 100%   General appearance: alert, cooperative and no distress Head: Normocephalic, without obvious abnormality, atraumatic Eyes: negative Nose: Nares normal. Septum midline. Mucosa normal. No drainage or sinus tenderness. Neck: no JVD and supple, symmetrical, trachea midline Lungs: clear to auscultation bilaterally Heart: regular rate and rhythm, S1, S2 normal, no murmur, click, rub or gallop Abdomen: soft, non-tender; bowel sounds normal; no masses,  no organomegaly x abd wall cellulitis noted and marked out with marker, already seems to be improving with vanc/rocephin given hours ago in ED Extremities: extremities normal, atraumatic, no cyanosis or edema Pulses: 2+ and symmetric Skin: Skin color, texture, turgor normal. No rashes or lesions Neurologic: Grossly normal    Labs on Admission:   Recent Labs  11/05/15 2225  NA 130*  K 4.9  CL 95*  CO2 19*  GLUCOSE 102*  BUN 116*  CREATININE 8.73*  CALCIUM 8.4*    Recent Labs  11/05/15 2225  AST 46*  ALT 21  ALKPHOS 129*  BILITOT 1.5*  PROT 7.6  ALBUMIN 3.4*    Recent Labs  11/05/15 2225  LIPASE 109*    Recent Labs  11/05/15 2225  WBC 6.6  HGB 12.2  HCT 35.9*  MCV 98.9  PLT 90*    Radiological Exams on Admission: Dg Chest 2 View  10/08/2015  CLINICAL DATA:  Productive cough off and on for 1 month, history  of pneumonia, former smoker, cirrhosis, end-stage renal disease on dialysis EXAM: CHEST  2 VIEW COMPARISON:  06/04/2015 FINDINGS: Normal heart size, mediastinal contours, and pulmonary vascularity. Atherosclerotic calcification aorta. Minimal thickening or fluid at base of major fissure on lateral view. Lungs otherwise clear. No additional pleural effusion or pneumothorax. Bones unremarkable. IMPRESSION: No significant abnormalities. Electronically Signed   By: Lavonia Dana M.D.   On: 10/08/2015 12:30    Assessment/Plan  40 yo female with recurrent abdominal wall cellulitis failing oral abx with NASH cirrhosis   Principal Problem:   Abdominal wall cellulitis- aggressively treat with iv vanc and rocephin.  Admit to  medical bed.  Already improving.  Active Problems:  Stable unless o/w noted   Liver cirrhosis secondary to nonalcoholic steatohepatitis (NASH)   Bipolar disorder (HCC)   Esophageal varices (HCC)   Portal hypertensive gastropathy   ESRD (end stage renal disease) on dialysis (HCC)   Chronic pain syndrome  obs on medical.  Full code.  Jachob Mcclean A 11/06/2015, 1:03 AM

## 2015-11-06 NOTE — Progress Notes (Signed)
Pharmacy Antibiotic Note  Wanda [name] is a 40 y.o. female admitted on 11/05/2015 with cellulitis.  Pharmacy has been consulted for Vancomycin dosing.  Planning dialysis for today since not dialyzed yesterday. Will most likely dialyze tomorrow also.  Plan: Vancomycin 1gm IV given in ED this AM at [facility], wil continue with Vancomycin 500mg  after HD today and after each HD on Tues, Thurs, and Saturday. Goal Tr 15-27mcg/ml   Height: 5\' 1"  (154.9 cm) Weight: 132 lb 9.6 oz (60.147 kg) IBW/kg (Calculated) : 47.8  Temp (24hrs), Avg:98 F (36.7 C), Min:97.6 F (36.4 C), Max:98.7 F (37.1 C)   Recent Labs Lab 11/05/15 2225 11/05/15 2356 11/06/15 0312 11/06/15 0541 11/06/15 0856 11/06/15 1148  WBC 6.6  --   --  5.0  --   --   CREATININE 8.73*  --   --  8.69*  --   --   LATICACIDVEN  --  1.59 1.00  --  1.1 1.0    Estimated Creatinine Clearance: 7.1 mL/min (by C-G formula based on Cr of 8.69).    Allergies  Allergen Reactions  . Lasix [Furosemide]     "doesn't work"  . Latex Itching  . Morphine And Related Hives and Itching    Antimicrobials this admission: Vancomycin 3/29 >>  Rocephin 3/29>>   Microbiology results: 3/29 BCx: pending   Thank you for allowing pharmacy to be a part of this patient's care. Isac Sarna, BS [place], [place] Clinical Pharmacist Pager [phone] 11/06/2015 1:37 PM

## 2015-11-06 NOTE — Progress Notes (Signed)
Patient c/o abdomen pain and heart burn, paged on call Md, will follow any new orders given and continue to monitor the patient.

## 2015-11-06 NOTE — Progress Notes (Signed)
[facility] PROGRESS NOTE  Wanda [name] [name] DOB: [birth year] DOA: 11/05/2015 PCP: [name], MD  Assessment/Plan: 1-Abdominal Wall Cellulitis; Continue with vancomycin, redness improving. Would continue with ceftriaxone to cover for SBP.   2-ESRD; nephrology consulted, due for dialysis today.   3-Hypotension, onchronic midodrine. Gets days of dialysis. Will resume midodrine. Repeat lactic acid.   4-Cirrhosis. Continue with lactulose.  Ceftriaxone to cover for SBP.  Due t redness would avoid at this time paracentesis.   5-Anxiety; resume ativan prn   Code Status: Full Code.  Family Communication: care discussed with patient.  Disposition Plan: remain inpatient.    Consultants:  Nephrology   Procedures:  HD  Antibiotics:  Vancomycin 3-29  Ceftriaxone 3-29  HPI/Subjective: Alert, report abdominal pain, redness abdominal wall.   Objective: Filed Vitals:   11/06/15 0630 11/06/15 0808  BP: 85/51 97/43  Pulse: 74 74  Temp:  97.9 F (36.6 C)  Resp: 16 15   No intake or output data in the 24 hours ending 11/06/15 0824 Filed Weights   11/05/15 2131  Weight: 68.04 kg (150 lb)    Exam:   General:  Alert in no acute distress  Cardiovascular: S 1, RRR  Respiratory: CTA  Abdomen: BS present, soft, Distended, mild redness, not extending mark.   Musculoskeletal: no edem  Data Reviewed: Basic Metabolic Panel:  Recent Labs Lab 11/05/15 2225 11/06/15 0541  NA 130* 131*  K 4.9 4.8  CL 95* 97*  CO2 19* 17*  GLUCOSE 102* 94  BUN 116* 116*  CREATININE 8.73* 8.69*  CALCIUM 8.4* 8.2*   Liver Function Tests:  Recent Labs Lab 11/05/15 2225  AST 46*  ALT 21  ALKPHOS 129*  BILITOT 1.5*  PROT 7.6  ALBUMIN 3.4*    Recent Labs Lab 11/05/15 2225  LIPASE 109*   No results for input(s): AMMONIA in the last 168 hours. CBC:  Recent Labs Lab 11/05/15 2225 11/06/15 0541  WBC 6.6 5.0  HGB 12.2 11.1*  HCT 35.9* 33.0*   MCV 98.9 98.8  PLT 90* 75*   Cardiac Enzymes: No results for input(s): CKTOTAL, CKMB, CKMBINDEX, TROPONINI in the last 168 hours. BNP (last 3 results) No results for input(s): BNP in the last 8760 hours.  ProBNP (last 3 results) No results for input(s): PROBNP in the last 8760 hours.  CBG: No results for input(s): GLUCAP in the last 168 hours.  No results found for this or any previous visit (from the past 240 hour(s)).   Studies: No results found.  Scheduled Meds: . lactulose  20 g Oral BID  . midodrine  10 mg Oral Once  . sevelamer carbonate  1,600 mg Oral TID WC  . sodium chloride flush  3 mL Intravenous Q12H   Continuous Infusions: . sodium chloride    . cefTRIAXone (ROCEPHIN)  IV      Principal Problem:   Abdominal wall cellulitis Active Problems:   Liver cirrhosis secondary to nonalcoholic steatohepatitis ([facility])   Bipolar disorder (HCC)   Esophageal varices ([facility]CC)   Portal hypertensive gastropathy   ESRD (end stage renal disease) on dialysis ([facility]CC)   Chronic pain syndrome    Time spent: 25 minutes.     Niel Hummer A  [facility] Pager [phone] If 7PM-7AM, please contact night-coverage at [email or web address], password [facility] 11/06/2015, 8:24 AM  LOS: 0 days

## 2015-11-06 NOTE — ED Notes (Signed)
Report called to [facility] but noticed that pt's bp was low.  Rechecked vitals and notified Dr. Tyrell Antonio and was told to hold pt in er until evaluated by hospitalist.  Sharyn Lull RN aware.

## 2015-11-06 NOTE — ED Notes (Signed)
Alert and oriented x4.  Skin pink.  Follow commands.

## 2015-11-07 DIAGNOSIS — Z992 Dependence on renal dialysis: Secondary | ICD-10-CM

## 2015-11-07 DIAGNOSIS — N186 End stage renal disease: Secondary | ICD-10-CM

## 2015-11-07 LAB — HEPATITIS B SURFACE ANTIGEN: Hepatitis B Surface Ag: NEGATIVE

## 2015-11-07 MED ORDER — CIPROFLOXACIN HCL 250 MG PO TABS: 250.0000 mg | ORAL_TABLET | Freq: Every day | ORAL | Status: DC

## 2015-11-07 MED ORDER — LIDOCAINE-PRILOCAINE 2.5-2.5 % EX CREA
TOPICAL_CREAM | CUTANEOUS | Status: AC
  Filled 2015-11-07: qty 5

## 2015-11-07 MED ORDER — CLINDAMYCIN HCL 300 MG PO CAPS: 600.0000 mg | ORAL_CAPSULE | Freq: Three times a day (TID) | ORAL | Status: DC

## 2015-11-07 NOTE — Care Management Important Message (Signed)
Important Message  Patient Details  Name: Wanda [name] MRN: [id number] Date of Birth: 1975/09/04   Medicare Important Message Given:  N/A - LOS <3 / Initial given by admissions    Alvie Heidelberg, RN 11/07/2015, 11:39 AM

## 2015-11-07 NOTE — Discharge Summary (Addendum)
Physician Discharge Summary  AZUL BRIERLY [name] DOB: 09-10-1975 DOA: 11/05/2015  PCP: [name], MD  Admit date: 11/05/2015 Discharge date: 11/07/2015  Time spent: 35 minutes  Recommendations for Outpatient Follow-up:  1. Needs to follow up with GI for further paracentesis as needed.  2. Needs to follow up resolution of abdominal wall cellulitis.    Discharge Diagnoses:    Abdominal wall cellulitis   Liver cirrhosis secondary to nonalcoholic steatohepatitis (NASH)   Bipolar disorder (HCC)   Esophageal varices (HCC)   Portal hypertensive gastropathy   ESRD (end stage renal disease) on dialysis [facility])   Chronic pain syndrome   Discharge Condition: stable  Diet recommendation: renal diet   Filed Weights   11/05/15 2131 11/06/15 1102 11/06/15 1355  Weight: 68.04 kg (150 lb) 60.147 kg (132 lb 9.6 oz) 60.2 kg (132 lb 11.5 oz)    History of present illness:  40 yo female h/o NASH cirrhosis with recurrent cellulitis to her abdominal wall on/off since jan of this year. Pt reports she has been on several different abx most recent being 10 days of doxycycline which she completed 2 days ago. The redness started to get worse about 3-4 days ago. No recent paracentesis. No fevers. No n/v/d. Pt is also ESRD on dialysis and gets dialysis T/R/Friday, she did not get dialysis yesterday due to pain she was having to abdomen. Pt referred for admission for failing outpt abx treatment for her cellulitis.  Hospital Course:  1-Abdominal Wall Cellulitis: Received vancomycin, redness improving.  Received  ceftriaxone to cover for SBP.  Redness improving. Wants to go home.  She will be discharge on ciprofloxacin and clindamycin.  Patient condition improved faster than expected.   2-ESRD; nephrology consulted, had HD 3-29  3-Hypotension, onchronic midodrine. Gets days of dialysis. Stable.   4-Cirrhosis. Continue with lactulose.  Ceftriaxone to cover for SBP. discharge on  ciprofloxacin  Decline paracentesis today   5-Anxiety; resume ativan prn  Procedures:  HD  Consultations:  Nephrology   Discharge Exam: Filed Vitals:   11/06/15 2100 11/07/15 0521  BP: 89/45 94/41  Pulse: 73 81  Temp: 98.2 F (36.8 C) 97.8 F (36.6 C)  Resp: 16 16    General: NAD Cardiovascular: S 1, S 2 RRR Respiratory: CTA Abdomen; no significant redness, distension.   Discharge Instructions   Discharge Instructions    Diet - low sodium heart healthy    Complete by:  As directed      Increase activity slowly    Complete by:  As directed           Current Discharge Medication List    START taking these medications   Details  ciprofloxacin (CIPRO) 250 MG tablet Take 1 tablet (250 mg total) by mouth daily. Qty: 7 tablet, Refills: 0    clindamycin (CLEOCIN) 300 MG capsule Take 2 capsules (600 mg total) by mouth 3 (three) times daily. Qty: 42 capsule, Refills: 0      CONTINUE these medications which have NOT CHANGED   Details  calcium acetate (PHOSLO) 667 MG capsule Take 667 mg by mouth 3 (three) times daily with meals. Refills: 0    Darbepoetin Alfa (ARANESP) 60 MCG/0.3ML SOSY injection Inject 0.3 mLs (60 mcg total) into the vein every Thursday with hemodialysis. Qty: 4.2 mL    lactulose (CHRONULAC) 10 GM/15ML solution TAKE 3 TEASPOONSFUL (15ML) BY MOUTH TWICE DAILY Qty: 946 mL, Refills: 5    lidocaine (XYLOCAINE) 2 % solution TAKE TWO TEASPOONSFUL (10ML) BY  MOUTH BEFORE MEALS AND AT BEDTIME TO PREVENT CHEST PAIN WHILE EATING Qty: 500 mL, Refills: 0    LORazepam (ATIVAN) 1 MG tablet Take 0.5-1 tablets (0.5-1 mg total) by mouth 2 (two) times daily as needed (FOR BACK SPASM OR ANXIETY). FOR BACK SPASM OR ANXIETY Qty: 45 tablet, Refills: 1    midodrine (PROAMATINE) 10 MG tablet Take 1 tablet (10 mg total) by mouth daily. *Takes only on dialysis days on Tuesdays, Thursdays, and Saturdays (Sometimes takes treatments on Friday instead of  Saturday)**Medication to keep your blood pressure from falling. Qty: 60 tablet, Refills: 3    Oxycodone HCl 10 MG TABS 1 PO TID AS NEEDED FOR PAIN Qty: 90 tablet, Refills: 0    promethazine (PHENERGAN) 12.5 MG tablet TAKE ONE TABLET BY MOUTH EVERY 6 HOURS AS NEEDED FOR NAUSEA AND VOMITING. Qty: 30 tablet, Refills: 0    RENVELA 800 MG tablet TAKE TWO (2) TABLETS BY MOUTH THREE TIMES DAILY WITH MEALS AND TAKE ONE TABLET BY MOUTH WITH SNACKS. Refills: 5    sucralfate (CARAFATE) 1 GM/10ML suspension Take 10 mLs (1 g total) by mouth 4 (four) times daily. Qty: 420 mL, Refills: 1    pantoprazole (PROTONIX) 40 MG tablet Take 1 tablet (40 mg total) by mouth 2 (two) times daily. Qty: 60 tablet, Refills: 2      STOP taking these medications     doxycycline (VIBRAMYCIN) 100 MG capsule      lidocaine-prilocaine (EMLA) cream        Allergies  Allergen Reactions  . Lasix [Furosemide]     "doesn't work"  . Latex Itching  . Morphine And Related Hives and Itching   Follow-up Information    Follow up with [name], MD In 1 week.   Specialty:  Nurse Practitioner   Contact information:   [place] [id number] [phone]       Follow up with Barney Drain, MD In 1 week.   Specialty:  Gastroenterology   Contact information:   [place] [address] [place] [address] [phone]        The results of significant diagnostics from this hospitalization (including imaging, microbiology, ancillary and laboratory) are listed below for reference.    Significant Diagnostic Studies: Dg Chest 2 View  10/08/2015  CLINICAL DATA:  Productive cough off and on for 1 month, history of pneumonia, former smoker, cirrhosis, end-stage renal disease on dialysis EXAM: CHEST  2 VIEW COMPARISON:  06/04/2015 FINDINGS: Normal heart size, mediastinal contours, and pulmonary vascularity. Atherosclerotic calcification aorta. Minimal thickening or fluid at base  of major fissure on lateral view. Lungs otherwise clear. No additional pleural effusion or pneumothorax. Bones unremarkable. IMPRESSION: No significant abnormalities. Electronically Signed   By: Lavonia Dana M.D.   On: 10/08/2015 12:30    Microbiology: Recent Results (from the past 240 hour(s))  Blood culture (routine x 2)     Status: None (Preliminary result)   Collection Time: 11/06/15 12:02 AM  Result Value Ref Range Status   Specimen Description BLOOD LEFT ARM  Final   Special Requests BOTTLES DRAWN AEROBIC ONLY 5CC  Final   Culture NO GROWTH 1 DAY  Final   Report Status PENDING  Incomplete  Blood culture (routine x 2)     Status: None (Preliminary result)   Collection Time: 11/06/15 12:07 AM  Result Value Ref Range Status   Specimen Description BLOOD  Final   Special Requests BOTTLES DRAWN AEROBIC ONLY 6CC  Final   Culture NO GROWTH 1 DAY  Final   Report Status PENDING  Incomplete     Labs: Basic Metabolic Panel:  Recent Labs Lab 11/05/15 2225 11/06/15 0541  NA 130* 131*  K 4.9 4.8  CL 95* 97*  CO2 19* 17*  GLUCOSE 102* 94  BUN 116* 116*  CREATININE 8.73* 8.69*  CALCIUM 8.4* 8.2*   Liver Function Tests:  Recent Labs Lab 11/05/15 2225  AST 46*  ALT 21  ALKPHOS 129*  BILITOT 1.5*  PROT 7.6  ALBUMIN 3.4*    Recent Labs Lab 11/05/15 2225  LIPASE 109*   No results for input(s): AMMONIA in the last 168 hours. CBC:  Recent Labs Lab 11/05/15 2225 11/06/15 0541  WBC 6.6 5.0  HGB 12.2 11.1*  HCT 35.9* 33.0*  MCV 98.9 98.8  PLT 90* 75*   Cardiac Enzymes: No results for input(s): CKTOTAL, CKMB, CKMBINDEX, TROPONINI in the last 168 hours. BNP: BNP (last 3 results) No results for input(s): BNP in the last 8760 hours.  ProBNP (last 3 results) No results for input(s): PROBNP in the last 8760 hours.  CBG: No results for input(s): GLUCAP in the last 168 hours.     Signed:  Elmarie Shiley MD.  [facility] 11/07/2015, 11:08 AM

## 2015-11-07 NOTE — Progress Notes (Signed)
Discharge instructions given on medications,and follow up visits,patient verbalized understanding.Prescriptions sent with patient.Accompanied by staff to an awaiting vehicle. 

## 2015-11-07 NOTE — Procedures (Signed)
   HEMODIALYSIS NURSING NOTE:  Orders clarified with Dr. Lowanda Foster and discusses with patient and primary RN.  Pt does NOT require dialysis today.  If she is being discharged, she is to report to outpatient center for HD tomorrow.   Rockwell Alexandria, RN, CDN

## 2015-11-07 NOTE — Progress Notes (Signed)
Subjective: Patient is feeling better. Presently she has less abdominal pain. But complains of some pain when she is swallowing.   Objective: Vital signs in last 24 hours: Temp:  [97.6 F (36.4 C)-98.2 F (36.8 C)] 97.8 F (36.6 C) (03/30 0521) Pulse Rate:  [67-81] 81 (03/30 0521) Resp:  [16] 16 (03/30 0521) BP: (78-105)/(39-61) 94/41 mmHg (03/30 0521) SpO2:  [99 %-100 %] 99 % (03/30 0521) Weight:  [132 lb 9.6 oz (60.147 kg)-132 lb 11.5 oz (60.2 kg)] 132 lb 11.5 oz (60.2 kg) (03/29 1355)  Intake/Output from previous day: 03/29 0701 - 03/30 0700 In: 120 [P.O.:120] Out: 0  Intake/Output this shift:     Recent Labs  11/05/15 2225 11/06/15 0541  HGB 12.2 11.1*    Recent Labs  11/05/15 2225 11/06/15 0541  WBC 6.6 5.0  RBC 3.63* 3.34*  HCT 35.9* 33.0*  PLT 90* 75*    Recent Labs  11/05/15 2225 11/06/15 0541  NA 130* 131*  K 4.9 4.8  CL 95* 97*  CO2 19* 17*  BUN 116* 116*  CREATININE 8.73* 8.69*  GLUCOSE 102* 94  CALCIUM 8.4* 8.2*   No results for input(s): LABPT, INR in the last 72 hours.  Generally patient is alert and in no apparent distress Chest is clear to auscultation Heart exam regular rate and rhythm Extremities she has trace to 1+ edema  Assessment/Plan: Problem #1 end-stage renal disease: She is status post hemodialysis yesterday. Her potassium is normal. Presently she denies any nausea or vomiting. Problem #2 cellulitis of her abdominal wall. Patient has been treated with antibiotics as an outpatient looks better but patient claims it was worse than before. Since she is on antibiotics and being followed by GI and PCP. Problem #3 history of liver cirrhosis with ascites. Problem #4 anemia: Her hemoglobin is without target goal Problem #5. Metabolic bone disease: Her calcium is range Problem #6 history of hypotension: Mostly on dialysis. Plan: Patient does require dialysis today We'll dialyze patient tomorrow and we'll check her basic metabolic  panel.   Jazae Gandolfi S 11/07/2015, 8:19 AM

## 2015-11-10 IMAGING — US US PARACENTESIS
1 series · 2 of 2 positions shown · non-contrast
Comparison: None.

CLINICAL DATA: Cirrhosis, ascites

EXAM:
ULTRASOUND GUIDED 03/19/2015 PARACENTESIS

[Series 1: us paracentesis · 0.26mm/px · 2 of 2 slices shown]
[im 1/2]
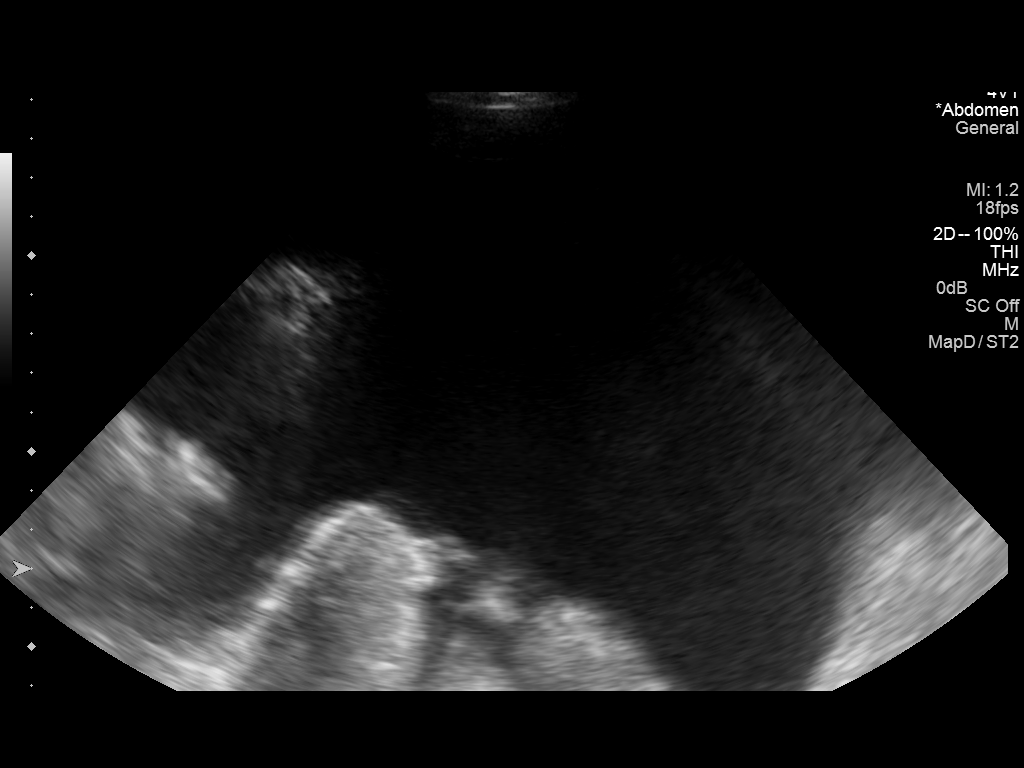
[im 2/2]
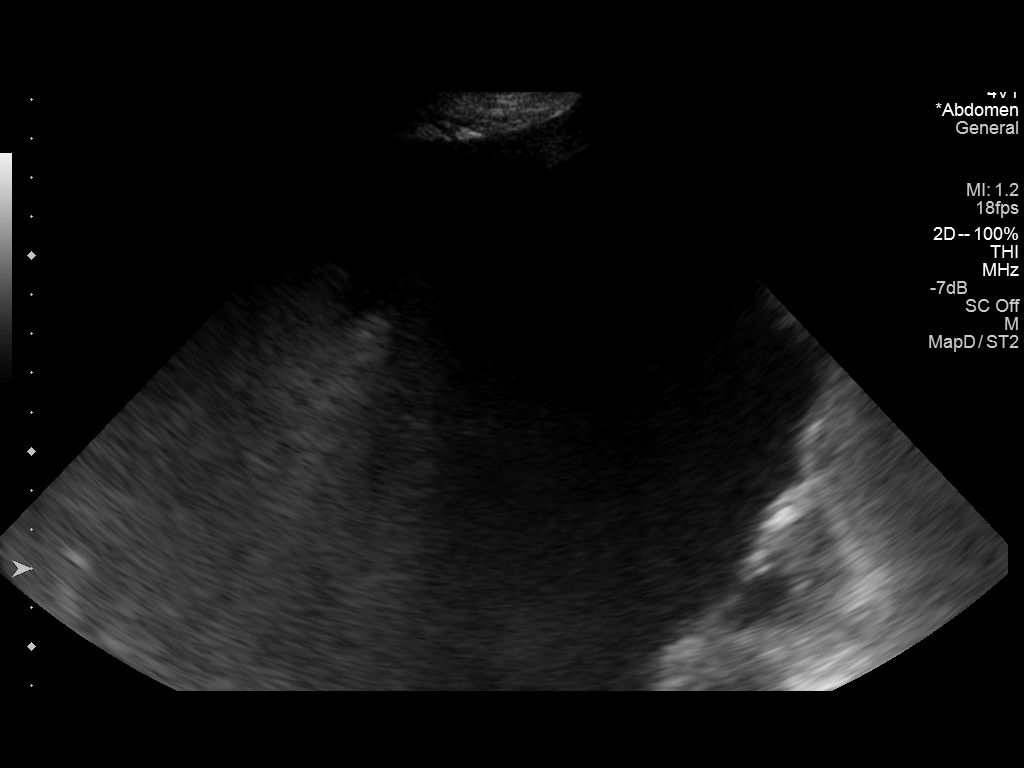

[2 of 2 positions shown; findings below may reference images not displayed]

PROCEDURE:
Procedure, benefits, and risks of procedure were discussed with
patient.

Written informed consent for procedure was obtained.

Time out protocol followed.

Adequate collection of ascites localized by ultrasound in RIGHT
lower quadrant.

Skin prepped and draped in usual sterile fashion.

Skin and soft tissues anesthetized with 10 mL of 1% lidocaine.

5 French Yueh catheter placed into peritoneal cavity.

0638 mL of yellow colored fluid aspirated by vacuum bottle suction.

Procedure tolerated well by patient without immediate complication.

COMPLICATIONS:
None
FINDINGS: As above
IMPRESSION: Successful ultrasound guided paracentesis yielding 0638 mL of
ascites.

## 2015-11-11 LAB — CULTURE, BLOOD (ROUTINE X 2)
CULTURE: NO GROWTH
CULTURE: NO GROWTH

## 2015-11-13 ENCOUNTER — Encounter: Payer: Self-pay | Admitting: Gastroenterology

## 2015-11-20 ENCOUNTER — Ambulatory Visit (HOSPITAL_COMMUNITY)
Admission: RE | Admit: 2015-11-20 | Discharge: 2015-11-20 | Disposition: A | Discharge status: Home / Self Care | Payer: Medicare Other | Source: Ambulatory Visit | Attending: Gastroenterology | Admitting: Gastroenterology

## 2015-11-20 DIAGNOSIS — R188 Other ascites: Secondary | ICD-10-CM | POA: Insufficient documentation

## 2015-11-20 MED ORDER — ALBUMIN HUMAN 25 % IV SOLN
25.0000 g | Freq: Once | INTRAVENOUS | Status: AC
  Administered 2015-11-20: 25 g via INTRAVENOUS

## 2015-11-20 MED ORDER — ALBUMIN HUMAN 25 % IV SOLN
INTRAVENOUS | Status: AC
  Administered 2015-11-20: 25 g via INTRAVENOUS
  Filled 2015-11-20: qty 100

## 2015-11-20 NOTE — Progress Notes (Signed)
Paracentesis complete no signs of distress. 6500 ml amber colored ascites removed.  

## 2015-11-22 IMAGING — US US PARACENTESIS
1 series · 3 of 3 positions shown · non-contrast
Comparison: None.

CLINICAL DATA: Ascites

EXAM:
ULTRASOUND GUIDED  PARACENTESIS

[Series 1: us paracentesis · 0.26mm/px · 3 of 3 slices shown]
[im 1/3]
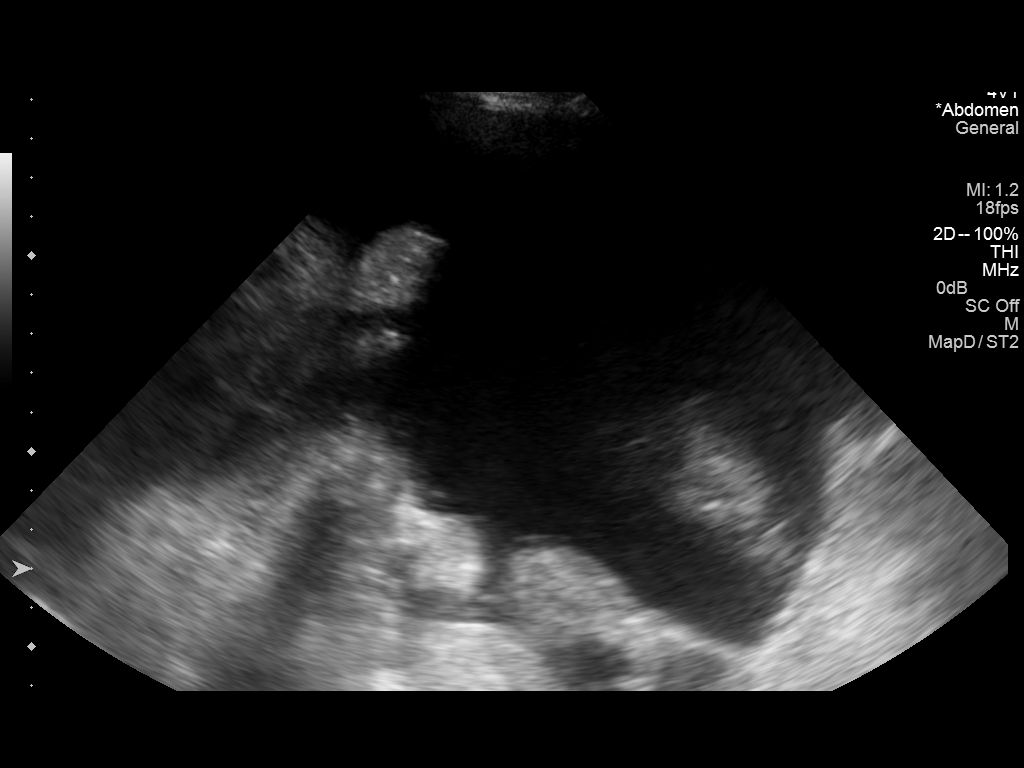
[im 2/3]
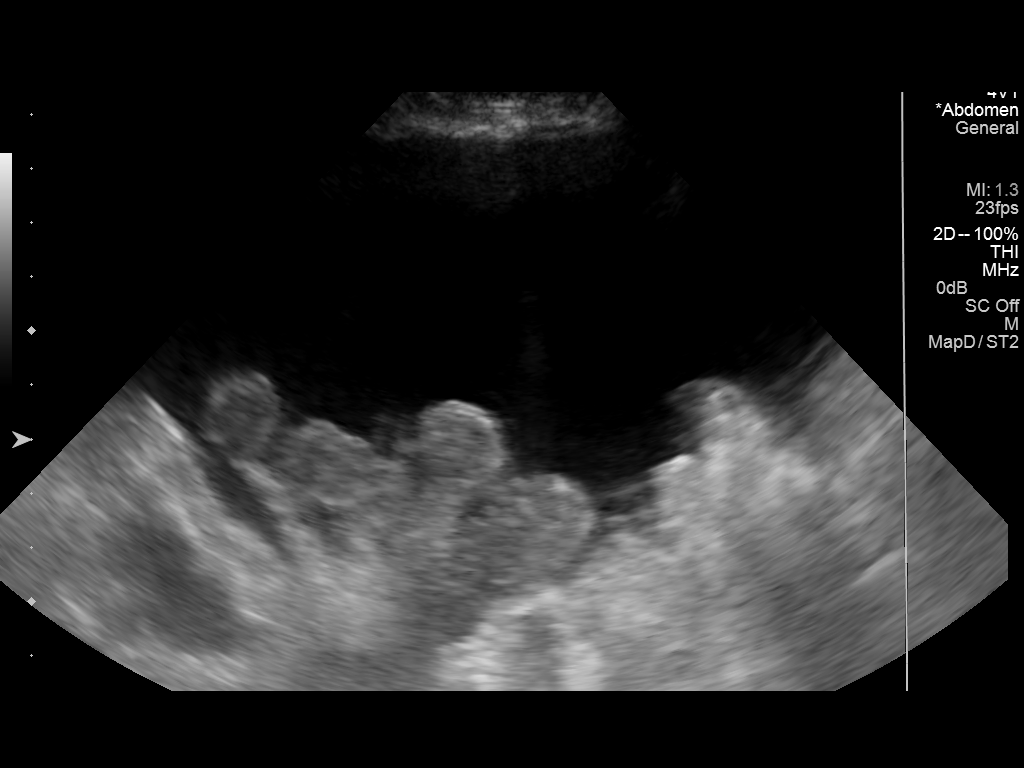
[im 3/3]
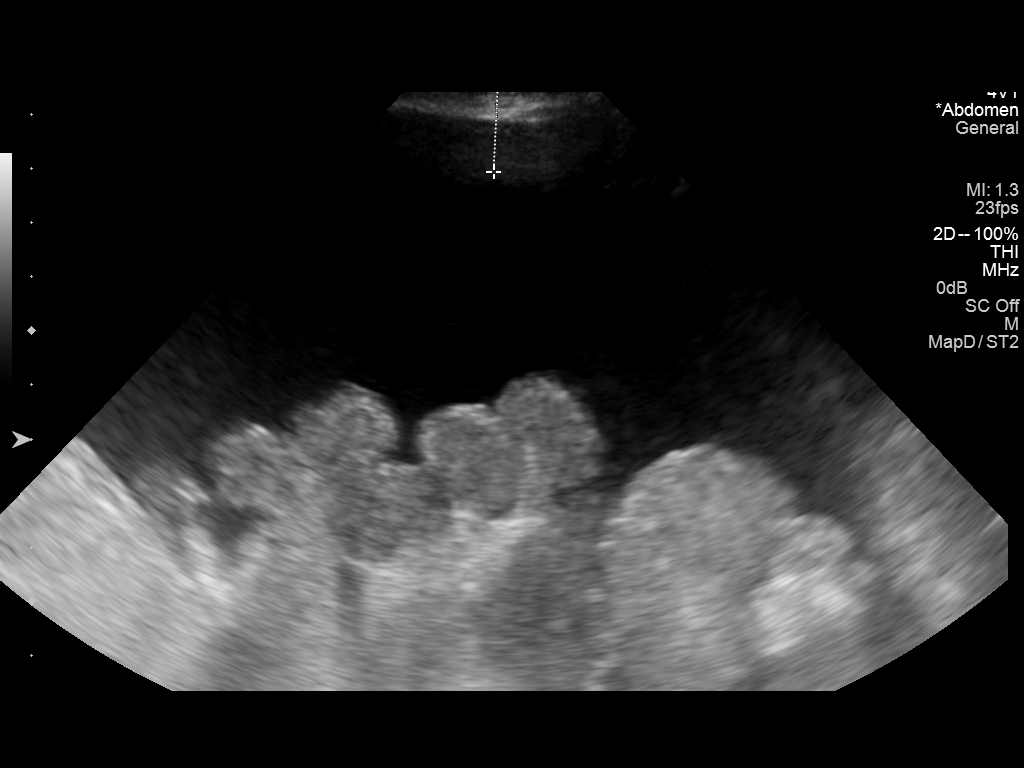

[3 of 3 positions shown; findings below may reference images not displayed]

PROCEDURE:
An ultrasound guided paracentesis was thoroughly discussed with the
patient and questions answered. The benefits, risks, alternatives
and complications were also discussed. The patient understands and
wishes to proceed with the procedure. Written consent was obtained.

Ultrasound was performed to localize and mark an adequate pocket of
fluid in the left lower quadrant of the abdomen. The area was then
prepped and draped in the normal sterile fashion. 1% Lidocaine was
used for local anesthesia. Under ultrasound guidance a 19 gauge Yueh
catheter was introduced. Paracentesis was performed. The catheter
was removed and a dressing applied.

COMPLICATIONS:
None.
FINDINGS: A total of approximately 4.7 L of clear yellow fluid was removed. A
fluid sample was not sent for laboratory analysis.
IMPRESSION: Successful ultrasound guided paracentesis yielding 4.7 L of ascites.

## 2015-11-28 ENCOUNTER — Encounter: Payer: Self-pay | Admitting: Nurse Practitioner

## 2015-11-28 ENCOUNTER — Ambulatory Visit (INDEPENDENT_AMBULATORY_CARE_PROVIDER_SITE_OTHER): Payer: Medicare Other | Admitting: Nurse Practitioner

## 2015-11-28 ENCOUNTER — Other Ambulatory Visit: Payer: Self-pay

## 2015-11-28 VITALS — BP 87/58 | HR 88 | Temp 97.3°F | Ht 61.0 in | Wt 128.6 lb

## 2015-11-28 DIAGNOSIS — L03311 Cellulitis of abdominal wall: Secondary | ICD-10-CM | POA: Diagnosis not present

## 2015-11-28 DIAGNOSIS — K7460 Unspecified cirrhosis of liver: Secondary | ICD-10-CM

## 2015-11-28 DIAGNOSIS — L0390 Cellulitis, unspecified: Secondary | ICD-10-CM

## 2015-11-28 DIAGNOSIS — K7581 Nonalcoholic steatohepatitis (NASH): Secondary | ICD-10-CM | POA: Diagnosis not present

## 2015-11-28 MED ORDER — CEPHALEXIN 500 MG PO CAPS: 500.0000 mg | ORAL_CAPSULE | Freq: Two times a day (BID) | ORAL | Status: DC

## 2015-11-28 NOTE — Assessment & Plan Note (Signed)
Patient with decompensated cirrhosis. Currently on lactulose having 2 soft bowel movements a day. We will update her labs today including CBC, CMP, PTT/INR, AFP. Last paracentesis on 11/20/2015 with 6-1/2 L of amber fluid removed. Next due for abdominal imaging in July 2017 and I put in a future order for abdominal ultrasound of her liver at that time. Recommended she contact [facility] transplant clinic for follow-up appointment as she has missed multiple appointments with them. Return for follow-up in 6 months for routine cirrhosis care or sooner based on other issues.

## 2015-11-28 NOTE — Patient Instructions (Signed)
1. I send in a prescription for Keflex to your pharmacy. 500 mg, take it twice a day for 10 days. 2. We are sending you for an urgent referral to infectious disease to have a single provider oversee and manage her persistent, worsening cellulitis. 3. Have your labs drawn with dialysis today. 4. He next due for an ultrasound of your liver in July, I put in an order now and you should be able to schedule out for July. [facility] transplant center about follow-up appointment. 6. Return for follow-up in 6 months.

## 2015-11-28 NOTE — Progress Notes (Signed)
cc'ed to pcp °

## 2015-11-28 NOTE — Assessment & Plan Note (Signed)
Patient has struggled with abdominal wall cellulitis since approximately September 2016. She has intermittent infection in her care is quite fractured between GI outpatient, emergency department, primary care, inpatient hospitalization and discharge instructions. Had previous provider examine her abdomen for changes as I had not seen her previously with her infection. States he appears it is slightly worse. At this point I will restart her on Keflex 500 mg twice daily for 10 days and refer her for an urgent infectious disease consult in order to attempt resolution of her infection under the auspices of 1 provider. Recommend return for follow-up as needed or in 6 months for routine cirrhosis care as per above.

## 2015-11-28 NOTE — Progress Notes (Signed)
Referring Provider: [name], MD Primary Care Physician:  [name], MD Primary GI:  Dr. Oneida Alar  Chief Complaint  Patient presents with  . Follow-up    HPI:   Wanda [name] is a 40 y.o. female who presents for hospital follow-up. She was last seen in our office on 10/23/2015 for follow-up on cirrhosis secondary to [place] as well as abdominal wall cellulitis. At that time is noted she had mild persisting abdominal wall cellulitis, stop clindamycin started doxycycline twice a day for 10 days with food. Labs related to her liver cirrhosis found meld score of 26 with a two-day [place] screening. Patient with appointment April 10 with Dr. Ahmed Prima liver transplant clinic at [facility]. However, it was noted and care everywhere she has issues with appointment compliance and tested positive for marijuana. Receives dialysis Tuesday services and Saturdays. Overdue for EGD with esophageal band ligation she wants to postpone at her last visit.   She was admitted to the hospital on 11/05/2015 for abdominal wall cellulitis and we saw her in consult for her cirrhosis. She failed outpatient treatment for her cellulitis. She received vancomycin with improving redness and ceftriaxone as well. She was discharged on Cipro and clindamycin. For her cirrhosis recommended continue with lactulose, ceftriaxone to cover for spontaneous bacterial peritonitis and discharged on Cipro. She declined paracentesis on her day of discharge. She was discharged on 11/07/2015. Per review of care everywhere there is no transplant appointment to occurred when scheduled for 05/29/2016.  Today she states she;s still dealing with the cellulitis and thinks she may need a different antibiotic. This is being managed in a very disjointed fashion between hospital admission, ER visits, and GI. Is asking for another antibiotic. States she doesn't have any information related to going back to [facility]. Still on lactulose, typically has 2 soft  bowel movements a day. Denies acute episodic confusion. Is having edema. Thinks she's allergic to saline. Has a paracentesis last week 11/20/15 with 6.5L removed. Denies yellowing of skin/eyes. Denies chest pain, dyspnea, dizziness, lightheadedness, syncope, near syncope. Denies any other upper or lower GI symptoms.  Past Medical History  Diagnosis Date  . GERD (gastroesophageal reflux disease)   . Cirrhosis ([place]) 10/05/13    Liver bx 11/23/13 (delayed initially due to patient refusal). c/w steatohepatitis  . Folate deficiency 09/2013  . Anasarca 10/10/2013  . Hematemesis/vomiting blood 02/24/2014  . Acute blood loss anemia 02/25/2014    Status post transfusion  . Macrocytosis 02/28/2014  . Bleeding esophageal varices ([place]) 02/28/2014    s/p banding  . Acute renal failure ([place]) 09/2013    Pre-renal- resolved  . C. difficile colitis 04/19/2014  . Anxiety   . Depression   . Thrombocytopenia ([place])     Hypercellular bone marrow; abundant megakaryocytes per 08/27/2014; s/p bone marrow bx  . Cirrhosis of liver with ascites ([place])   . SBP (spontaneous bacterial peritonitis) ([place]) 11/10/2013  . Bipolar disorder ([place]) 12/04/2013    2007-SEEN IN ED FOR INVOLUNTARY COMMITMENT, UDS POS FOR AMPHETAMINES/OPIATES   . PNA (pneumonia) 10/13/2013  . Chronic hypotension   . Gastroesophageal junction ulcer 09/17/2014  . ESRD (end stage renal disease) on dialysis ([place]) 08/2014    Past Surgical History  Procedure Laterality Date  . None    . Paracentesis  Feb 2015    1180 fluid, negative fluid analysis.   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  Paracentesis  10/2013  . Colonoscopy N/A 12/19/2013    SLF:NO OBVIOUS SOURCE FOR ANEMIA IDETIFIED/ONE COLON POLYP REMOVED/Small internal hemorrhoids  . Esophagogastroduodenoscopy N/A 02/11/2014    Dr. Rourk:Esophageal varices with bleeding  stigmata-status post esophageal band ligation therapy. Portal gastropathy  . Esophagogastroduodenoscopy N/A 07/04/2014    RMR: Persiting grade 2 esophageal varicies with bleeding stigmata status post band ligation. Significantly congested gastric mucosa iwith changes constistant with protal gastropathy.   . Esophageal banding  07/04/2014    Procedure: ESOPHAGEAL BANDING;  Surgeon: Daneil Dolin, MD;  Location: [facility];  Service: Endoscopy;;  . Esophagogastroduodenoscopy (egd) with propofol N/A 07/24/2014    SLF:  1. 2 columns grade 2-3 varices- 2 Bands applied.  2.  Moderate gastropathy 3. Duodenal Diverticula  . Esophageal banding N/A 07/24/2014    Procedure: ESOPHAGEAL BANDING (2 bands applied);  Surgeon: Danie Binder, MD;  Location: [facility] ORS;  Service: Endoscopy;  Laterality: N/A;  . Central venous catheter insertion Right   . Esophagogastroduodenoscopy N/A 09/16/2014    Rehman: Single short column of varix proximal to GE junction not large enough to be banded. Two amall ulcers at the GEJ felt to be source of GI Bleeding but no active bleeding but no actibe bleeding noted. No therapy rendered. Portal gastropathy NO evidence of peptic ulcer diease or gastric varices.   . Esophagogastroduodenoscopy (egd) with propofol N/A 10/26/2014    RMR: 2 columns of grade 2 esophageal varices without obvious bleeding stigmata status post band ligation to complet obliteration of remaining varices.   . Av fistula placement Right 11/16/2014    Procedure: Right arm Creation of arteriovenous fistula;  Surgeon: Angelia Mould, MD;  Location: [facility] OR;  Service: Vascular;  Laterality: Right;  . Esophagogastroduodenoscopy N/A 12/14/2014    SLF: Grade ! esophageal varices. 2. Moderate Portal Gastropathy  . Esophagogastroduodenoscopy N/A 03/27/2015    Procedure: ESOPHAGOGASTRODUODENOSCOPY (EGD);  Surgeon: Danie Binder, MD;  [facility]: [facility];  Service: Endoscopy;  Laterality: N/A;  1045am - moved to 817 @  11:30  . Esophagogastroduodenoscopy N/A 06/05/2015    Procedure: ESOPHAGOGASTRODUODENOSCOPY (EGD);  Surgeon: Clarene Essex, MD;  Location[facility] ENDOSCOPY;  Service: Endoscopy;  Laterality: N/A;  . Esophagogastroduodenoscopy N/A 07/23/2015    SLF:1. Grade 1 esophageal varices 2. Moderate portal hypertensive gastropathy 3. MILd non-erosive gastritis.   . Esophageal banding N/A 07/23/2015    Procedure: ESOPHAGEAL BANDING;  Surgeon: Danie Binder, MD;  Location: [facility];  Service: Endoscopy;  Laterality: N/A;    Current Outpatient Prescriptions  Medication Sig Dispense Refill  . calcium acetate (PHOSLO) 667 MG capsule Take 667 mg by mouth 3 (three) times daily with meals.  0  . Darbepoetin Alfa (ARANESP) 60 MCG/0.3ML SOSY injection Inject 0.3 mLs (60 mcg total) into the vein every Thursday with hemodialysis. 4.2 mL   . lactulose (CHRONULAC) 10 GM/15ML solution TAKE 3 TEASPOONSFUL (15ML) BY MOUTH TWICE DAILY (Patient taking differently: Said she takes about 2 tsps daily) 946 mL 5  . lidocaine (XYLOCAINE) 2 % solution TAKE TWO TEASPOONSFUL (10ML) BY MOUTH BEFORE MEALS AND AT BEDTIME TO PREVENT CHEST PAIN WHILE EATING (Patient taking differently: Take 10 mLs by mouth 3 (three) times daily as needed (TAKE TWO TEASPOONSFUL (10ML) BY MOUTH BEFORE MEALS AND AT BEDTIME TO PREVENT CHEST PAIN WHILE EATING). TAKE TWO TEASPOONSFUL (10ML) BY MOUTH BEFORE MEALS AND AT BEDTIME TO PREVENT CHEST PAIN WHILE EATING) 500 mL 0  . LORazepam (ATIVAN) 1 MG tablet Take 0.5-1  tablets (0.5-1 mg total) by mouth 2 (two) times daily as needed (FOR BACK SPASM OR ANXIETY). FOR BACK SPASM OR ANXIETY 45 tablet 1  . midodrine (PROAMATINE) 10 MG tablet Take 1 tablet (10 mg total) by mouth daily. *Takes only on dialysis days on Tuesdays, Thursdays, and Saturdays (Sometimes takes treatments on Friday instead of Saturday)**Medication to keep your blood pressure from falling. 60 tablet 3  . Oxycodone HCl 10 MG TABS 1 PO TID AS NEEDED FOR  PAIN (Patient taking differently: Take 10 mg by mouth 3 (three) times daily as needed (for pain). FOR PAIN) 90 tablet 0  . pantoprazole (PROTONIX) 40 MG tablet Take 1 tablet (40 mg total) by mouth 2 (two) times daily. 60 tablet 2  . promethazine (PHENERGAN) 12.5 MG tablet TAKE ONE TABLET BY MOUTH EVERY 6 HOURS AS NEEDED FOR NAUSEA AND VOMITING. 30 tablet 0  . cephALEXin (KEFLEX) 500 MG capsule Take 1 capsule (500 mg total) by mouth 2 (two) times daily. 20 capsule 0  . ciprofloxacin (CIPRO) 250 MG tablet Take 1 tablet (250 mg total) by mouth daily. (Patient not taking: Reported on 11/28/2015) 7 tablet 0  . clindamycin (CLEOCIN) 300 MG capsule Take 2 capsules (600 mg total) by mouth 3 (three) times daily. (Patient not taking: Reported on 11/28/2015) 42 capsule 0  . RENVELA 800 MG tablet Reported on 11/28/2015  5  . sucralfate (CARAFATE) 1 GM/10ML suspension Take 10 mLs (1 g total) by mouth 4 (four) times daily. (Patient not taking: Reported on 11/28/2015) 420 mL 1   No current facility-administered medications for this visit.    Allergies as of 11/28/2015 - Review Complete 11/28/2015  Allergen Reaction Noted  . Lasix [furosemide]  04/12/2014  . Latex Itching 05/14/2015  . Morphine and related Hives and Itching 09/16/2014    Family History  Problem Relation Age of Onset  . Heart disease Mother   . Colon cancer Neg Hx   . Liver disease Neg Hx     Social History   Social History  . Marital Status: Single    Spouse Name: N/A  . Number of Children: 1  . Years of Education: N/A   Occupational History  . unemployed    Social History Main Topics  . Smoking status: Former Smoker -- 0.25 packs/day for 20 years    Types: Cigarettes    Quit date: 11/05/2008  . Smokeless tobacco: Never Used     Comment: Never really smoked much  . Alcohol Use: No  . Drug Use: No  . Sexual Activity:    Partners: Male    Birth Control/ Protection: None   Other Topics Concern  . None   Social History  Narrative    Review of Systems: 10-point ROS negative except as per HPI.   Physical Exam: BP 87/58 mmHg  Pulse 88  Temp(Src) 97.3 F (36.3 C) (Oral)  Ht 5\' 1"  (1.549 m)  Wt 128 lb 9.6 oz (58.333 kg)  BMI 24.31 kg/m2  LMP 09/19/2013 General:   Alert and oriented. Pleasant and cooperative. Well-nourished and well-developed.  Head:  Normocephalic and atraumatic. Eyes:  Without icterus, sclera clear and conjunctiva pink.  Ears:  Normal auditory acuity. Cardiovascular:  S1, S2 present with systolic murmur appreciated. Extremities with 2-3+ pitting edema. Respiratory:  Clear to auscultation bilaterally. No wheezes, rales, or rhonchi. No distress.  Gastrointestinal:  +BS, soft, and non-distended. Mild TTP generalized. No guarding or rebound. Umbilical hernia noted. Rectal:  Deferred  Skin:  Abdominal wall  skin with mottled red appearance and mild warmth. No purulence noted. Demarcation line intact. Neurologic:  Alert and oriented x4;  grossly normal neurologically. Psych:  Alert and cooperative. Normal mood and affect. Heme/Lymph/Immune: No excessive bruising noted.    11/28/2015 1:17 PM   Disclaimer: This note was dictated with voice recognition software. Similar sounding words can inadvertently be transcribed and may not be corrected upon review.

## 2015-12-03 ENCOUNTER — Telehealth: Payer: Self-pay | Admitting: Gastroenterology

## 2015-12-03 NOTE — Telephone Encounter (Signed)
Wanda [name] returned call and is aware of the referral and that Dr. Oneida Alar will address the prescription for pain med.

## 2015-12-03 NOTE — Telephone Encounter (Signed)
PATIENT CALLED STATING THAT SHE IS ALMOST OUT OF PAIN MEDS AND WILL NEED A SCRIPT TO PICK UP, [phone] , ALSO INQUIRING ABOUT REFERRAL TO INFECTIOUS DISEASES OFFICE

## 2015-12-03 NOTE — Telephone Encounter (Signed)
Tried to call pt to let her know that she has been referred to Infectious Disease and per Ginger they are processing the referral.  Sending to Dr. Oneida Alar for the pain med prescriptions.  Could not leave a message. VM was full.

## 2015-12-04 NOTE — Telephone Encounter (Addendum)
PLEASE CALL PT. SHE CAN PICK UP RX AFTER 3 PM TODAY-RX FOR MAY 1-#93, JUN 1-#90 & JUL 1-#93 GIVEN, RFX0.

## 2015-12-04 NOTE — Telephone Encounter (Signed)
Wanda [name] is aware Rx's will be ready after 3:00 pm today.

## 2015-12-05 ENCOUNTER — Telehealth: Payer: Self-pay | Admitting: Gastroenterology

## 2015-12-05 NOTE — Telephone Encounter (Addendum)
Rx ready for OXYCODONE READY FOR pick up.

## 2015-12-05 NOTE — Telephone Encounter (Signed)
Pt called to check on a prescription and [name] will be by today to pick it up. Also, patient said that she will run out of her antibiotic this weekend and needs a refill on that. Please advise.

## 2015-12-05 NOTE — Telephone Encounter (Signed)
Rx's are at the front desk.

## 2015-12-05 NOTE — Telephone Encounter (Signed)
Routing to EG. Pt has not been to see ID yet. Wanda [name] is working on this.  Dr.Fields has the other Rx's the pt is needing.

## 2015-12-06 ENCOUNTER — Telehealth: Payer: Self-pay | Admitting: Gastroenterology

## 2015-12-06 ENCOUNTER — Ambulatory Visit (HOSPITAL_COMMUNITY)
Admission: RE | Admit: 2015-12-06 | Discharge: 2015-12-06 | Disposition: A | Discharge status: Home / Self Care | Payer: Medicare Other | Source: Ambulatory Visit | Attending: Gastroenterology | Admitting: Gastroenterology

## 2015-12-06 DIAGNOSIS — L03311 Cellulitis of abdominal wall: Secondary | ICD-10-CM

## 2015-12-06 DIAGNOSIS — K7460 Unspecified cirrhosis of liver: Secondary | ICD-10-CM

## 2015-12-06 DIAGNOSIS — K7581 Nonalcoholic steatohepatitis (NASH): Principal | ICD-10-CM

## 2015-12-06 DIAGNOSIS — R188 Other ascites: Secondary | ICD-10-CM | POA: Insufficient documentation

## 2015-12-06 MED ORDER — ALBUMIN HUMAN 25 % IV SOLN
25.0000 g | Freq: Once | INTRAVENOUS | Status: AC
  Administered 2015-12-06: 25 g via INTRAVENOUS

## 2015-12-06 MED ORDER — ALBUMIN HUMAN 25 % IV SOLN
INTRAVENOUS | Status: AC
  Administered 2015-12-06: 25 g via INTRAVENOUS
  Filled 2015-12-06: qty 100

## 2015-12-06 MED ORDER — CEPHALEXIN 500 MG PO CAPS: 500.0000 mg | ORAL_CAPSULE | Freq: Two times a day (BID) | ORAL | Status: DC

## 2015-12-06 NOTE — Telephone Encounter (Signed)
Tell the patient I sent in 1 more refill. After that, she will need the ID providers to direct her antibiotics as this has been a ongoing issue with multiple antibiotics tried and not much improvement.

## 2015-12-06 NOTE — Addendum Note (Signed)
Addended by: Gordy Levan, Roth Ress A on: 12/06/2015 11:27 AM   Modules accepted: Orders

## 2015-12-06 NOTE — Telephone Encounter (Signed)
Pt called this morning asking if an antibiotic had been called in for her.  She said that she would be out of her prescription this weekend. I told her that I had put a phone note in yesterday when she called, but I needed to check with DS but she wasn't available at the moment. Transferred call to VM

## 2015-12-06 NOTE — Progress Notes (Signed)
Paracentesis complete no signs of distress. 5100 amber colored ascites removed.

## 2015-12-06 NOTE — Telephone Encounter (Signed)
I returned pt's call to the number she left [phone] and VM full and I could not leave a message. Routing to Walden Field, NP who saw her in the office last.

## 2015-12-06 NOTE — Telephone Encounter (Signed)
Rich from [place] said that she could come on now for the PARA. [name] is aware

## 2015-12-06 NOTE — Telephone Encounter (Signed)
Pt called at 1000 asking is she could get a para scheduled for today. Please advise and call [phone]

## 2015-12-06 NOTE — Telephone Encounter (Signed)
Pt called back and is aware I sent the message to Walden Field, NP.

## 2015-12-06 NOTE — Telephone Encounter (Signed)
Tried to call and mail box is full.  

## 2015-12-08 IMAGING — US US PARACENTESIS
1 series · 3 of 3 positions shown · non-contrast
Comparison: 04/24/2015

CLINICAL DATA: Cirrhosis, ascites

EXAM:
ULTRASOUND GUIDED THERAPEUTIC PARACENTESIS

[Series 1: us paracentesis · 0.19mm/px · 3 of 3 slices shown]
[im 1/3]
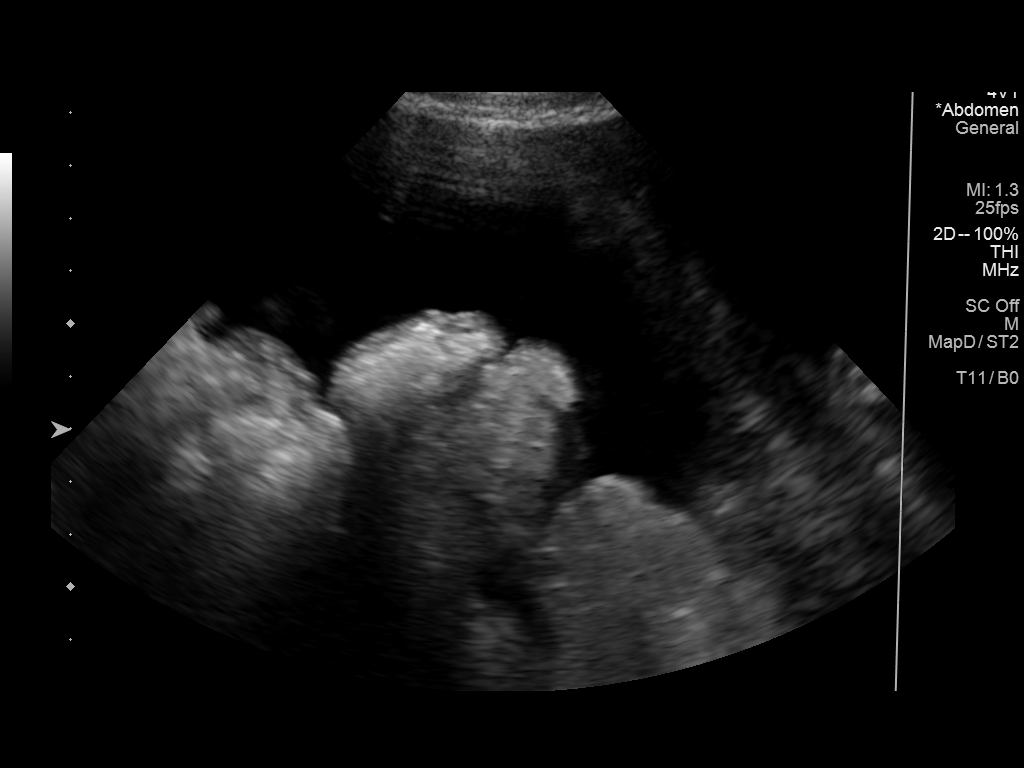
[im 2/3]
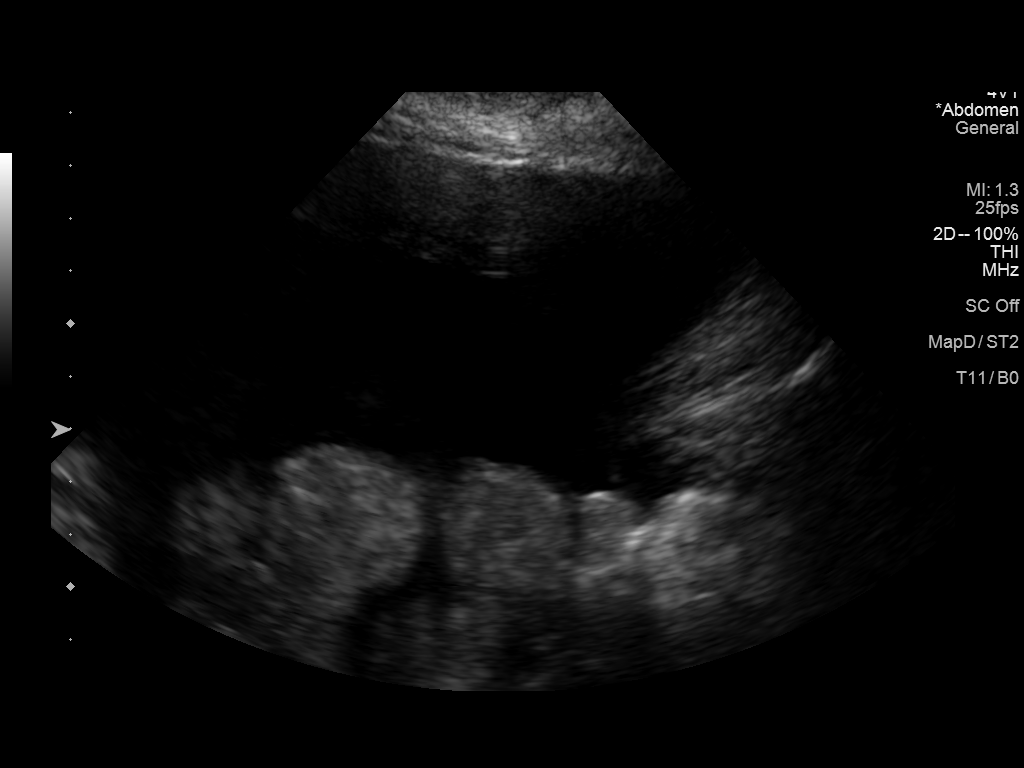
[im 3/3]
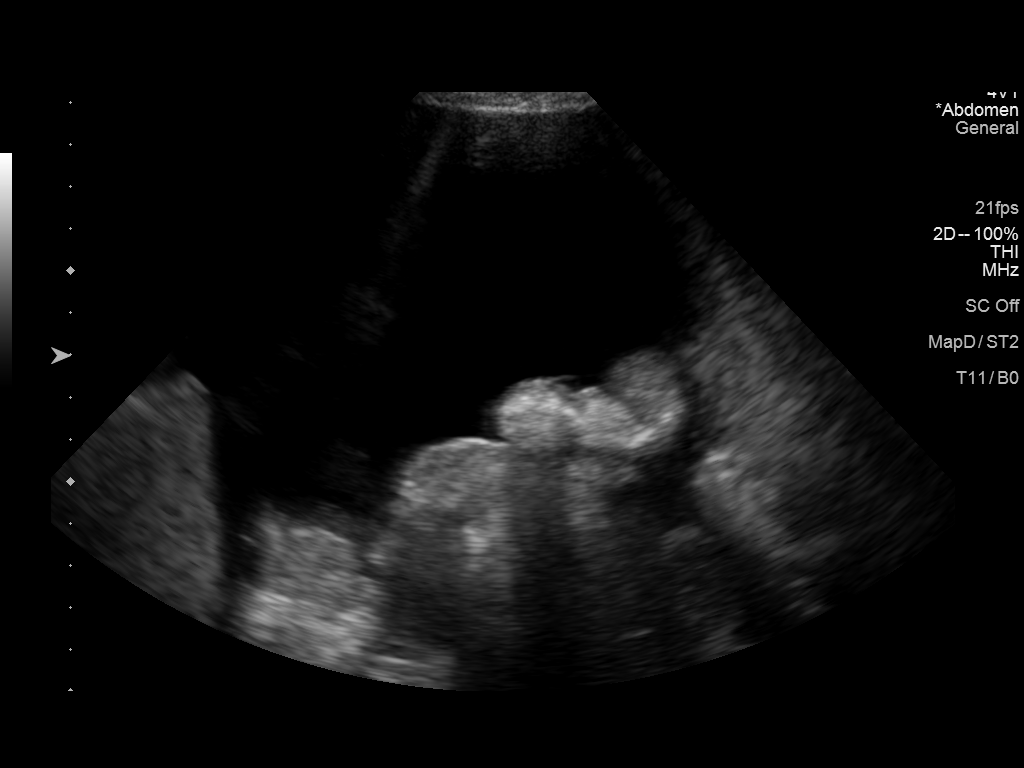

[3 of 3 positions shown; findings below may reference images not displayed]

PROCEDURE:
Procedure, benefits, and risks of procedure were discussed with
patient.

Written informed consent for procedure was obtained.

Time out protocol followed.

Adequate collection of ascites localized by ultrasound in RIGHT
lower quadrant.

Skin prepped and draped in usual sterile fashion.

Skin and soft tissues anesthetized with 10 mL of 1% lidocaine.

5 French Yueh catheter placed into peritoneal cavity.

9777 mL of amber color fluid aspirated by vacuum bottle suction.

Procedure tolerated well by patient without immediate complication.

COMPLICATIONS:
None
FINDINGS: As above
IMPRESSION: Successful ultrasound guided paracentesis yielding 9777 mL of
ascites.

## 2015-12-08 IMAGING — US US EXTREM LOW VENOUS*L*
1 series · 13 of 24 positions shown · non-contrast
Comparison: None.

CLINICAL DATA: 38-year-old female with a history of lower extremity
edema.



[Series 1: us extrem low venous*left* · 0.26mm/px · 13 of 35 slices shown]
[im 1/35]
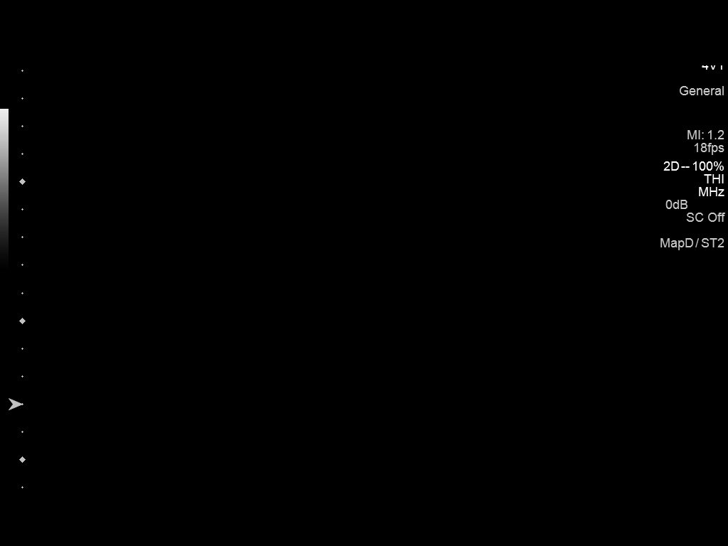
[im 3/35]
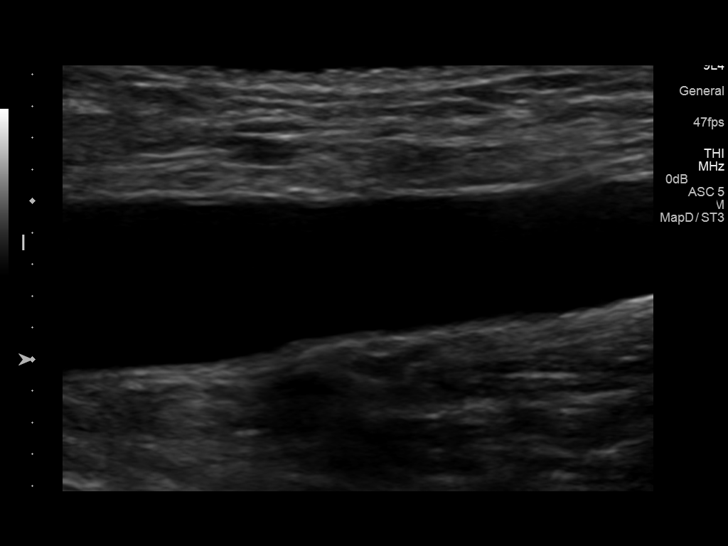
[im 6/35]
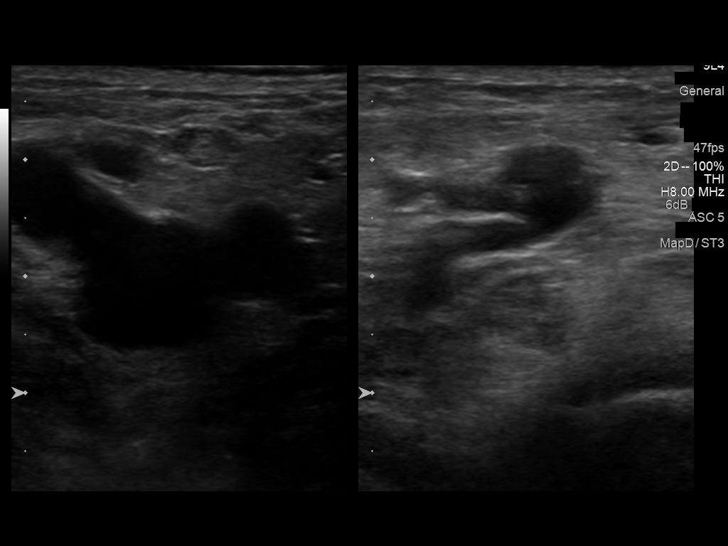
[im 9/35]
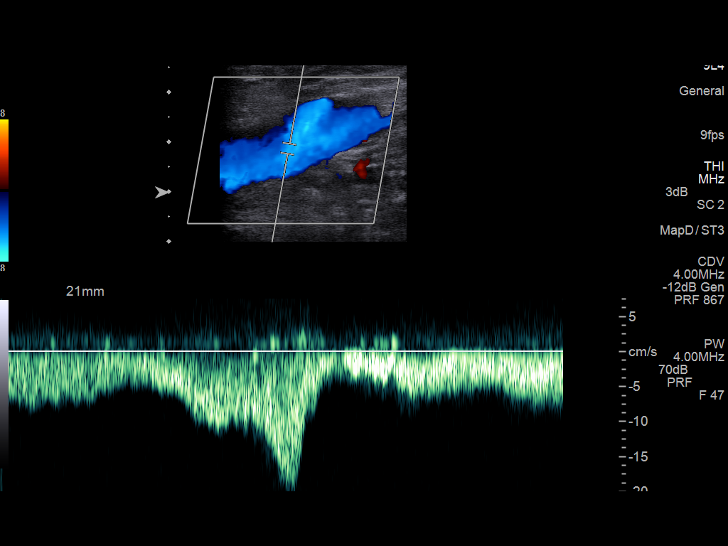
[im 12/35]
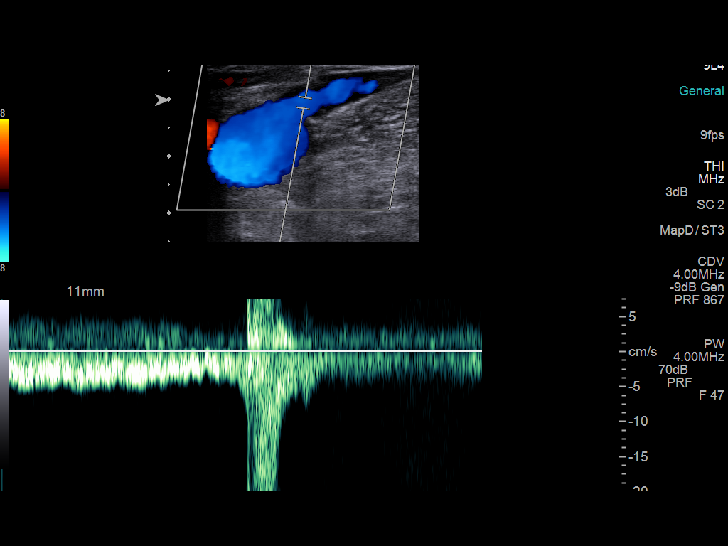
[im 15/35]
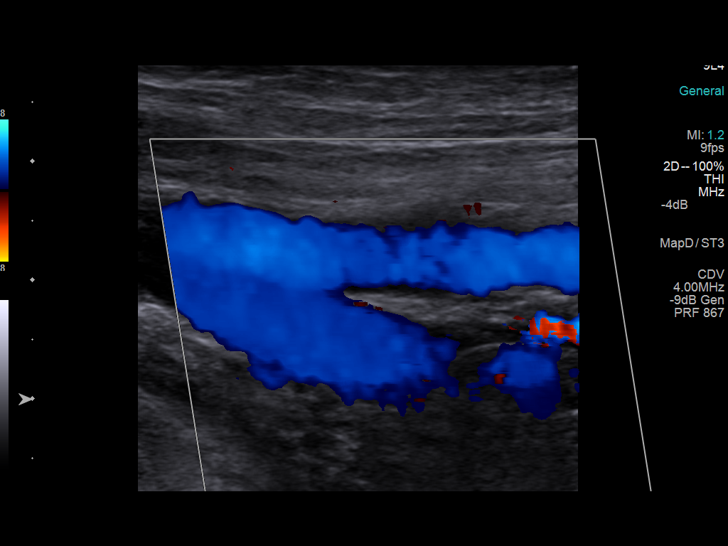
[im 18/35]
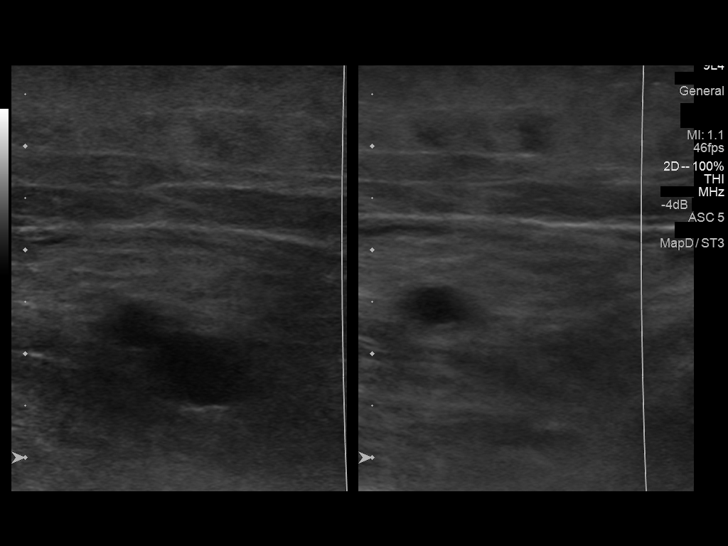
[im 20/35]
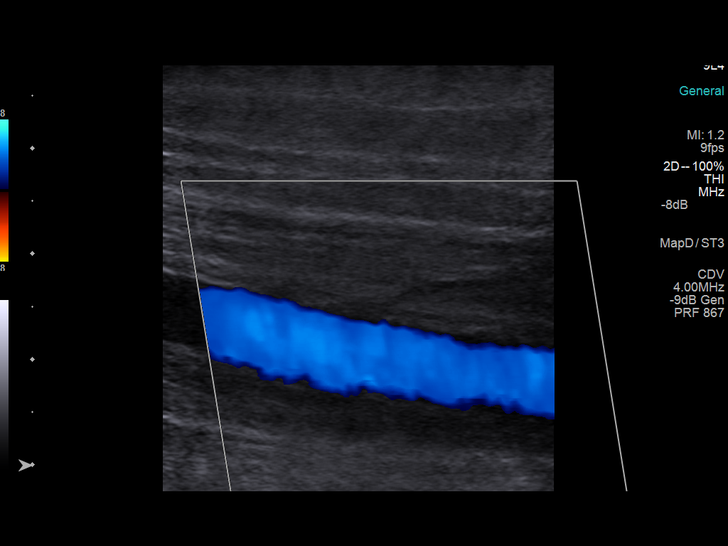
[im 23/35]
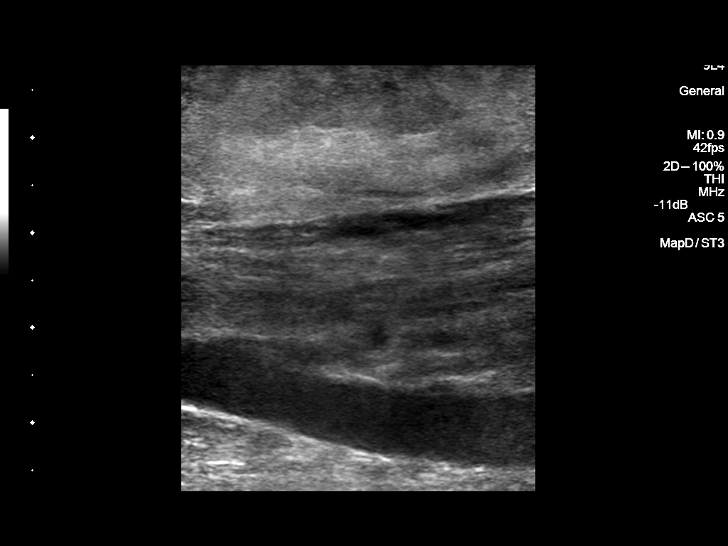
[im 26/35]
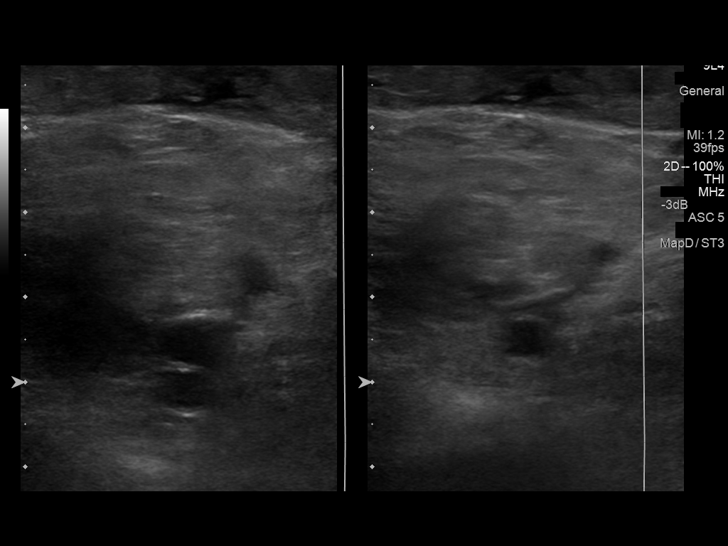
[im 29/35]
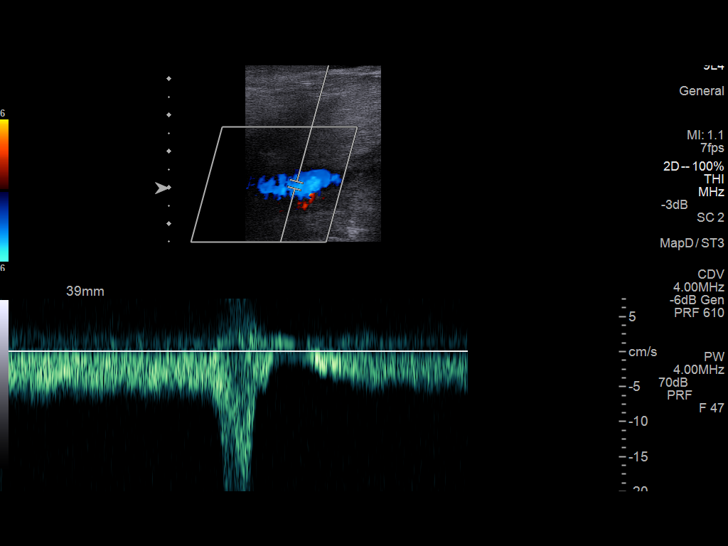
[im 32/35]
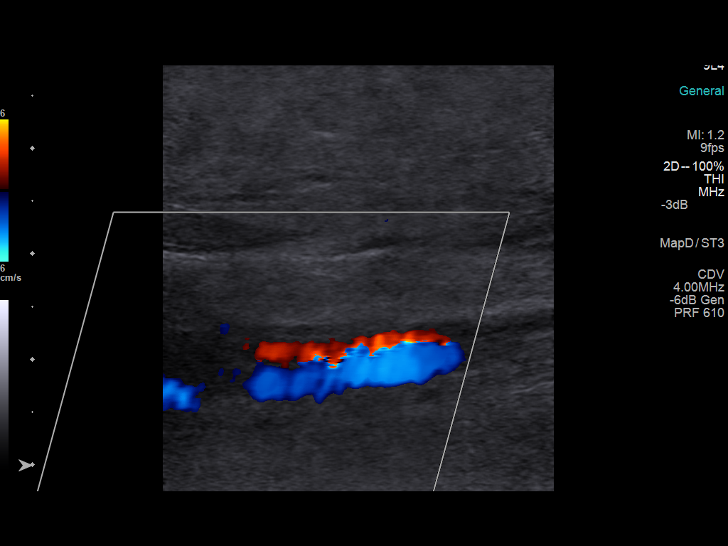
[im 35/35]
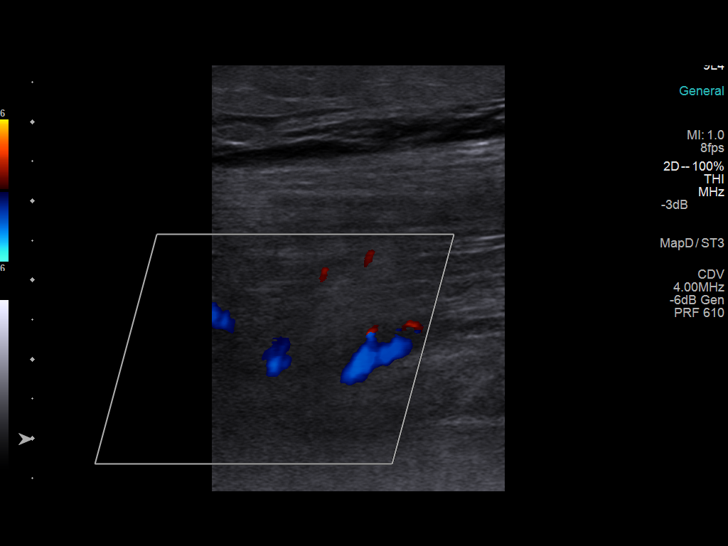

[13 of 24 positions shown; findings below may reference images not displayed]

FINDINGS: Contralateral Common Femoral Vein: Respiratory phasicity is normal
and symmetric with the symptomatic side. No evidence of thrombus.
Normal compressibility.

Common Femoral Vein: No evidence of thrombus. Normal
compressibility, respiratory phasicity and response to augmentation.

Saphenofemoral Junction: No evidence of thrombus. Normal
compressibility and flow on color Doppler imaging.

Profunda Femoral Vein: No evidence of thrombus. Normal
compressibility and flow on color Doppler imaging.

Femoral Vein: No evidence of thrombus. Normal compressibility,
respiratory phasicity and response to augmentation.

Popliteal Vein: No evidence of thrombus. Normal compressibility,
respiratory phasicity and response to augmentation.

Calf Veins: No evidence of thrombus. Normal compressibility and flow
on color Doppler imaging.

Superficial Great Saphenous Vein: No evidence of thrombus. Normal
compressibility and flow on color Doppler imaging.

Other Findings:  None.
IMPRESSION: Sonographic survey of the left lower extremity negative for DVT.

## 2015-12-09 NOTE — Telephone Encounter (Signed)
Pt called and asked for a different antibiotic. Michela Pitcher that one has not been working. I told her I was not sure that Randall Hiss could do that. But I will let him know. She is aware that he was not gong to send in antibiotics after this time that she needs to see ID.

## 2015-12-11 NOTE — Telephone Encounter (Signed)
Unfortunately we can't keep changing antibiotics at random. It's imperitive she see ID for better infection management because it's obvious standard treatments aren't effective.

## 2015-12-12 ENCOUNTER — Other Ambulatory Visit: Payer: Self-pay

## 2015-12-12 MED ORDER — LORAZEPAM 1 MG PO TABS
0.5000 mg | ORAL_TABLET | Freq: Two times a day (BID) | ORAL | Status: DC | PRN
Start: 2015-12-12 — End: 2016-01-10

## 2015-12-12 NOTE — Telephone Encounter (Signed)
Called and could not leave a VM.  

## 2015-12-12 NOTE — Telephone Encounter (Signed)
Pt called and is aware. Said she has not heard from ID yet. I told her I would have Candy check on that.

## 2015-12-13 NOTE — Telephone Encounter (Signed)
Referral was sent to ID. Chart is still under review

## 2015-12-18 ENCOUNTER — Telehealth: Payer: Self-pay | Admitting: Gastroenterology

## 2015-12-18 DIAGNOSIS — R188 Other ascites: Secondary | ICD-10-CM

## 2015-12-18 NOTE — Telephone Encounter (Signed)
Pt called this afternoon to say she had been referred to a specialist and it'll be another month before her appt with them. She is asking for another prescription of ?keflex or something similar to it. Please advise and call her tomorrow. I told her that [facility] nurse was not here today, but I would let her be aware. [phone]

## 2015-12-19 NOTE — Telephone Encounter (Signed)
Tried to call pt to find out when her appt is and if she had it rescheduled or what. Many rings and no answer.

## 2015-12-19 NOTE — Telephone Encounter (Signed)
Pt called to see if SLF was going to call in the abx. She said that it is for abd swelling and it is red/hot to the touch.SLF asked if she would stop by the Endo department tomorrow and let her look at it. She is aware to go by  Endo.

## 2015-12-19 NOTE — Telephone Encounter (Signed)
Forwarding to Dr. Fields to advise! 

## 2015-12-19 NOTE — Telephone Encounter (Signed)
Noted. At this point I'd like SLF to weigh in on any ongoing abx given her prolonged treatments on multiple regimens. Let's keep trying to get ahold of her related to her date of ID appointment.

## 2015-12-20 ENCOUNTER — Encounter (HOSPITAL_COMMUNITY): Payer: Self-pay

## 2015-12-20 ENCOUNTER — Ambulatory Visit (HOSPITAL_COMMUNITY)
Admission: RE | Admit: 2015-12-20 | Discharge: 2015-12-20 | Disposition: A | Discharge status: Home / Self Care | Payer: Medicare Other | Source: Ambulatory Visit | Attending: Gastroenterology | Admitting: Gastroenterology

## 2015-12-20 DIAGNOSIS — R188 Other ascites: Secondary | ICD-10-CM | POA: Diagnosis not present

## 2015-12-20 DIAGNOSIS — K7460 Unspecified cirrhosis of liver: Secondary | ICD-10-CM | POA: Diagnosis present

## 2015-12-20 NOTE — Procedures (Signed)
PreOperative Dx: Cirrhosis, ascites Postoperative Dx: Cirrhosis, ascites Procedure:   US guided paracentesis Radiologist:  Thornton Papas Anesthesia:  10 ml of1% lidocaine Specimen:  4750 ml of yellow ascitic fluid EBL:   < 1 ml Complications: None

## 2015-12-20 NOTE — Addendum Note (Signed)
Addended by: Danie Binder on: 12/20/2015 11:47 AM   Modules accepted: Orders

## 2015-12-20 NOTE — Telephone Encounter (Signed)
Pt seen and examined in endo. ABDOMEN DISTENDED, Mild erythema and warmth but most likely due to to venous stasis changes/ascites. Reducible umbilical hernia. NEEDS LVP TODAY.

## 2015-12-20 NOTE — Progress Notes (Signed)
Paracentesis complete no signs of distress. 4750 ml yellow colored ascites removed.

## 2015-12-29 IMAGING — US US PARACENTESIS
1 series · 3 of 3 positions shown · non-contrast
Comparison: None.

CLINICAL DATA: Ascites

EXAM:
ULTRASOUND GUIDED  PARACENTESIS

[Series 1: us paracentesis · 0.24mm/px · 3 of 3 slices shown]
[im 1/3]
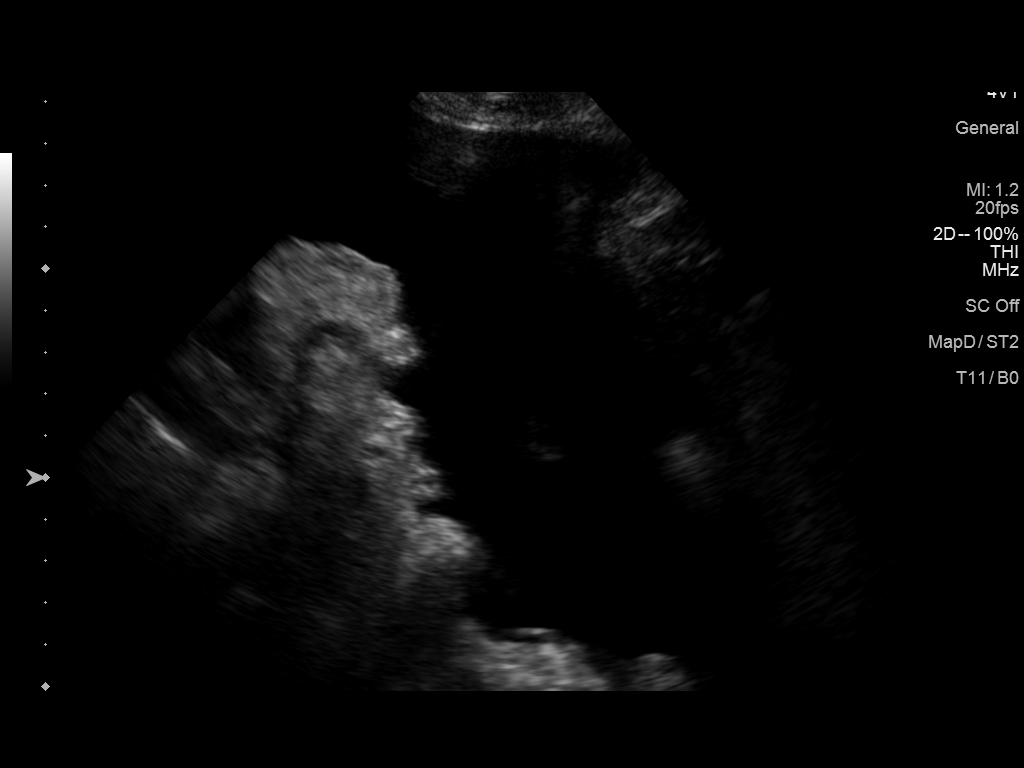
[im 2/3]
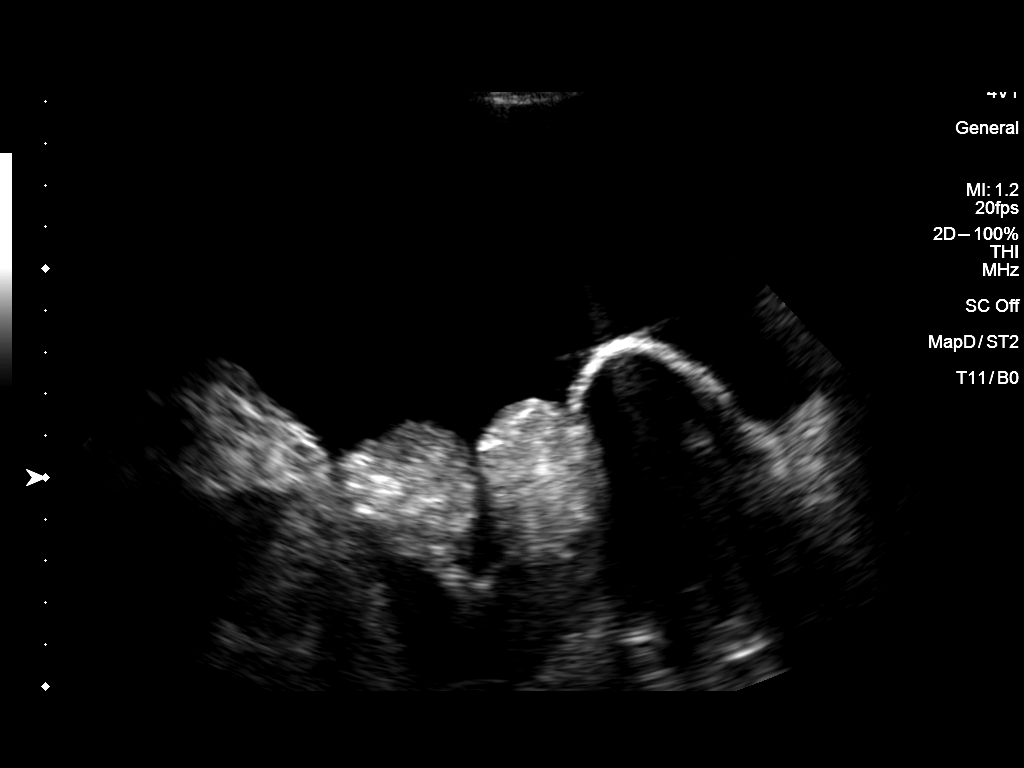
[im 3/3]
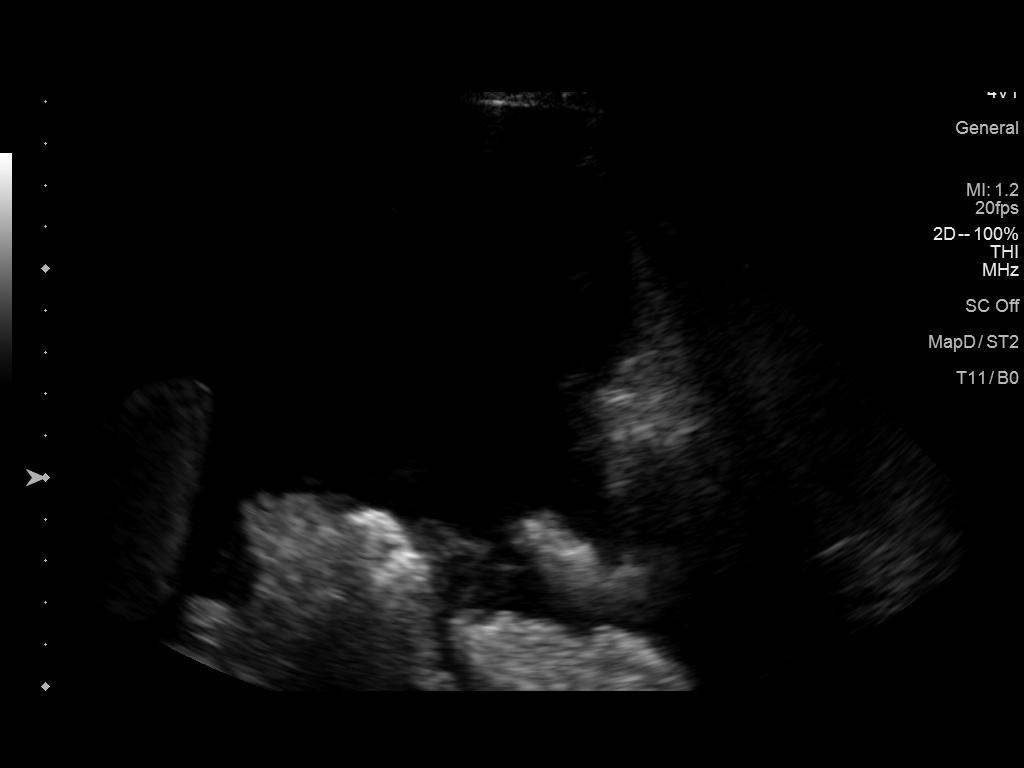

[3 of 3 positions shown; findings below may reference images not displayed]

PROCEDURE:
An ultrasound guided paracentesis was thoroughly discussed with the
patient and questions answered. The benefits, risks, alternatives
and complications were also discussed. The patient understands and
wishes to proceed with the procedure. Written consent was obtained.

Ultrasound was performed to localize and mark an adequate pocket of
fluid in the right lower quadrant of the abdomen. The area was then
prepped and draped in the normal sterile fashion. 1% Lidocaine was
used for local anesthesia. Under ultrasound guidance a 19 gauge Yueh
catheter was introduced. Paracentesis was performed. The catheter
was removed and a dressing applied.

COMPLICATIONS:
None.
FINDINGS: A total of approximately 6.5 L of clear amber fluid was removed. A
fluid sample was not sent for laboratory analysis.
IMPRESSION: Successful ultrasound guided paracentesis yielding 6.5 L of ascites.

## 2016-01-02 ENCOUNTER — Encounter (HOSPITAL_COMMUNITY): Payer: Self-pay | Admitting: Emergency Medicine

## 2016-01-02 ENCOUNTER — Emergency Department (HOSPITAL_COMMUNITY)
Admission: EM | Admit: 2016-01-02 | Discharge: 2016-01-03 | Disposition: A | Discharge status: Home / Self Care | Payer: Medicare Other | Attending: Emergency Medicine | Admitting: Emergency Medicine

## 2016-01-02 ENCOUNTER — Emergency Department (HOSPITAL_COMMUNITY): Payer: Medicare Other

## 2016-01-02 DIAGNOSIS — N186 End stage renal disease: Secondary | ICD-10-CM | POA: Diagnosis not present

## 2016-01-02 DIAGNOSIS — Z992 Dependence on renal dialysis: Secondary | ICD-10-CM | POA: Insufficient documentation

## 2016-01-02 DIAGNOSIS — R1010 Upper abdominal pain, unspecified: Secondary | ICD-10-CM | POA: Diagnosis present

## 2016-01-02 DIAGNOSIS — F319 Bipolar disorder, unspecified: Secondary | ICD-10-CM | POA: Insufficient documentation

## 2016-01-02 DIAGNOSIS — Z87891 Personal history of nicotine dependence: Secondary | ICD-10-CM | POA: Diagnosis not present

## 2016-01-02 DIAGNOSIS — Z79899 Other long term (current) drug therapy: Secondary | ICD-10-CM | POA: Insufficient documentation

## 2016-01-02 DIAGNOSIS — Z79891 Long term (current) use of opiate analgesic: Secondary | ICD-10-CM | POA: Diagnosis not present

## 2016-01-02 DIAGNOSIS — R188 Other ascites: Secondary | ICD-10-CM

## 2016-01-02 DIAGNOSIS — R109 Unspecified abdominal pain: Secondary | ICD-10-CM

## 2016-01-02 LAB — GRAM STAIN: SPECIAL REQUESTS: NORMAL

## 2016-01-02 LAB — CBC
HCT: 35.7 % — ABNORMAL LOW (ref 36.0–46.0)
Hemoglobin: 11.5 g/dL — ABNORMAL LOW (ref 12.0–15.0)
MCH: 35 pg — AB (ref 26.0–34.0)
MCHC: 32.2 g/dL (ref 30.0–36.0)
MCV: 108.5 fL — ABNORMAL HIGH (ref 78.0–100.0)
Platelets: 94 10*3/uL — ABNORMAL LOW (ref 150–400)
RBC: 3.29 MIL/uL — ABNORMAL LOW (ref 3.87–5.11)
RDW: 15.2 % (ref 11.5–15.5)
WBC: 8.4 10*3/uL (ref 4.0–10.5)

## 2016-01-02 LAB — URINALYSIS, ROUTINE W REFLEX MICROSCOPIC
BILIRUBIN URINE: NEGATIVE
Glucose, UA: NEGATIVE mg/dL
KETONES UR: NEGATIVE mg/dL
Leukocytes, UA: NEGATIVE
Nitrite: NEGATIVE
Protein, ur: 30 mg/dL — AB
Specific Gravity, Urine: 1.025 (ref 1.005–1.030)
pH: 5.5 (ref 5.0–8.0)

## 2016-01-02 LAB — COMPREHENSIVE METABOLIC PANEL
ALBUMIN: 3.3 g/dL — AB (ref 3.5–5.0)
ALK PHOS: 273 U/L — AB (ref 38–126)
ALT: 43 U/L (ref 14–54)
AST: 105 U/L — AB (ref 15–41)
Anion gap: 19 — ABNORMAL HIGH (ref 5–15)
BILIRUBIN TOTAL: 3.7 mg/dL — AB (ref 0.3–1.2)
BUN: 76 mg/dL — AB (ref 6–20)
CALCIUM: 8.6 mg/dL — AB (ref 8.9–10.3)
CO2: 19 mmol/L — ABNORMAL LOW (ref 22–32)
CREATININE: 8.63 mg/dL — AB (ref 0.44–1.00)
Chloride: 94 mmol/L — ABNORMAL LOW (ref 101–111)
GFR calc Af Amer: 6 mL/min — ABNORMAL LOW (ref >60)
GFR calc non Af Amer: 5 mL/min — ABNORMAL LOW (ref >60)
GLUCOSE: 84 mg/dL (ref 65–99)
Potassium: 4.2 mmol/L (ref 3.5–5.1)
Sodium: 132 mmol/L — ABNORMAL LOW (ref 135–145)
TOTAL PROTEIN: 8 g/dL (ref 6.5–8.1)

## 2016-01-02 LAB — URINE MICROSCOPIC-ADD ON

## 2016-01-02 LAB — CBG MONITORING, ED: GLUCOSE-CAPILLARY: 75 mg/dL (ref 65–99)

## 2016-01-02 LAB — GLUCOSE, SYNOVIAL FLUID: Glucose, Synovial Fluid: 90 mg/dL

## 2016-01-02 LAB — PROTIME-INR
INR: 1.22 (ref 0.00–1.49)
Prothrombin Time: 15.6 seconds — ABNORMAL HIGH (ref 11.6–15.2)

## 2016-01-02 LAB — LIPASE, BLOOD: Lipase: 129 U/L — ABNORMAL HIGH (ref 11–51)

## 2016-01-02 LAB — TROPONIN I: TROPONIN I: 0.04 ng/mL — AB (ref <0.031)

## 2016-01-02 MED ORDER — POVIDONE-IODINE 10 % EX SOLN
CUTANEOUS | Status: AC
  Filled 2016-01-02: qty 118

## 2016-01-02 MED ORDER — LIDOCAINE HCL (PF) 2 % IJ SOLN
10.0000 mL | Freq: Once | INTRAMUSCULAR | Status: AC
  Administered 2016-01-02: 10 mL via INTRADERMAL
  Filled 2016-01-02: qty 10

## 2016-01-02 MED ORDER — FENTANYL CITRATE (PF) 100 MCG/2ML IJ SOLN
50.0000 ug | Freq: Once | INTRAMUSCULAR | Status: AC
  Administered 2016-01-02: 50 ug via INTRAVENOUS
  Filled 2016-01-02: qty 2

## 2016-01-02 MED ORDER — ONDANSETRON HCL 4 MG/2ML IJ SOLN
4.0000 mg | Freq: Once | INTRAMUSCULAR | Status: AC
  Administered 2016-01-02: 4 mg via INTRAVENOUS
  Filled 2016-01-02: qty 2

## 2016-01-02 NOTE — ED Notes (Addendum)
Pt states she has been having abdominal pain off and on for a while but constant since Tuesday.  Denies other symptoms.  Did not go to dialysis today and feels as if fluid is building up in abdomen again.

## 2016-01-02 NOTE — ED Notes (Signed)
MD Rancour notified of rectal temp of 97.2

## 2016-01-02 NOTE — ED Provider Notes (Signed)
CSN: [id number]     Arrival date & time 01/02/16  1736 History   First MD Initiated Contact with Patient 01/02/16 1922     Chief Complaint  Patient presents with  . Abdominal Pain     (Consider location/radiation/quality/duration/timing/severity/associated sxs/prior Treatment) HPI Comments: Patient with history of end-stage renal disease on dialysis as well as end-stage liver disease with cirrhosis from [place] present with upper abdominal pain constant for the past 3 days. Gets paracenteses on a regular basis and last was 2 weeks ago she is unable to state whether this feels like she is too much fluid on her. She missed dialysis today because she was in too much pain. Her last dialysis session was 2 days ago. Denies fever. Has had nausea but no vomiting. Denies any diarrhea. No chest pain or shortness of breath. She still has a gallbladder and appendix. She's also been treated intermittently for abdominal wall cellulitis but is not on antibiotics currently.  The history is provided by the patient.    Past Medical History  Diagnosis Date  . GERD (gastroesophageal reflux disease)   . Cirrhosis ([place]) 10/05/13    Liver bx 11/23/13 (delayed initially due to patient refusal). c/w steatohepatitis  . Folate deficiency 09/2013  . Anasarca 10/10/2013  . Hematemesis/vomiting blood 02/24/2014  . Acute blood loss anemia 02/25/2014    Status post transfusion  . Macrocytosis 02/28/2014  . Bleeding esophageal varices ([place]) 02/28/2014    s/p banding  . Acute renal failure ([place]) 09/2013    Pre-renal- resolved  . C. difficile colitis 04/19/2014  . Anxiety   . Depression   . Thrombocytopenia ([place])     Hypercellular bone marrow; abundant megakaryocytes per 08/27/2014; s/p bone marrow bx  . Cirrhosis of liver with ascites ([place])   . SBP (spontaneous bacterial peritonitis) ([place]) 11/10/2013  . Bipolar disorder ([place]) 12/04/2013    2007-SEEN IN ED FOR INVOLUNTARY COMMITMENT, UDS POS FOR AMPHETAMINES/OPIATES   . PNA  (pneumonia) 10/13/2013  . Chronic hypotension   . Gastroesophageal junction ulcer 09/17/2014  . ESRD (end stage renal disease) on dialysis ([place]) 08/2014   Past Surgical History  Procedure Laterality Date  . None    . Paracentesis  Feb 2015    1180 fluid, negative fluid analysis.   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 Paracentesis  10/2013  . Colonoscopy N/A 12/19/2013    SLF:NO OBVIOUS SOURCE FOR ANEMIA IDETIFIED/ONE COLON POLYP REMOVED/Small internal hemorrhoids  . Esophagogastroduodenoscopy N/A 02/11/2014    Dr. Rourk:Esophageal varices with bleeding stigmata-status post esophageal band ligation therapy. Portal gastropathy  . Esophagogastroduodenoscopy N/A 07/04/2014    RMR: Persiting grade 2 esophageal varicies with bleeding stigmata status post band ligation. Significantly congested gastric mucosa iwith changes constistant with protal gastropathy.   . Esophageal banding  07/04/2014    Procedure: ESOPHAGEAL BANDING;  Surgeon: Daneil Dolin, MD;  Location: [facility] END[facility] SU[facility];  Service: Endoscopy;;  . Esophagogastroduodenoscopy (egd) with propofol N/A 07/24/2014    SLF:  1. 2 columns grade 2-3 varices- 2 Bands applied.  2.  Moderate gastropathy 3. Duodenal Diverticula  . Esophageal banding N/A 07/24/2014    Procedure: ESOPHAGEAL BANDING (2 bands applied);  Surgeon: Danie Binder, MD;  Location: [facility] ORS;  Service: Endoscopy;  Laterality: N/A;  . Central venous catheter insertion Right   . Esophagogastroduodenoscopy N/A 09/16/2014    Rehman: Single short column of varix  proximal to GE junction not large enough to be banded. Two amall ulcers at the GEJ felt to be source of GI Bleeding but no active bleeding but no actibe bleeding noted. No therapy rendered. Portal gastropathy NO evidence of peptic ulcer diease or gastric varices.   . Esophagogastroduodenoscopy (egd)  with propofol N/A 10/26/2014    RMR: 2 columns of grade 2 esophageal varices without obvious bleeding stigmata status post band ligation to complet obliteration of remaining varices.   . Av fistula placement Right 11/16/2014    Procedure: Right arm Creation of arteriovenous fistula;  Surgeon: Angelia Mould, MD;  Location: [facility] OR;  Service: Vascular;  Laterality: Right;  . Esophagogastroduodenoscopy N/A 12/14/2014    SLF: Grade ! esophageal varices. 2. Moderate Portal Gastropathy  . Esophagogastroduodenoscopy N/A 03/27/2015    Procedure: ESOPHAGOGASTRODUODENOSCOPY (EGD);  Surgeon: Danie Binder, MD;  Location: [facility];  Service: Endoscopy;  Laterality: N/A;  1045am - moved to 817 @ 11:30  . Esophagogastroduodenoscopy N/A 06/05/2015    Procedure: ESOPHAGOGASTRODUODENOSCOPY (EGD);  Surgeon: Clarene Essex, MD;  Location: [facility] ENDOSCOPY;  Service: Endoscopy;  Laterality: N/A;  . Esophagogastroduodenoscopy N/A 07/23/2015    SLF:1. Grade 1 esophageal varices 2. Moderate portal hypertensive gastropathy 3. MILd non-erosive gastritis.   . Esophageal banding N/A 07/23/2015    Procedure: ESOPHAGEAL BANDING;  Surgeon: Danie Binder, MD;  Location: [facility];  Service: Endoscopy;  Laterality: N/A;   Family History  Problem Relation Age of Onset  . Heart disease Mother   . Colon cancer Neg Hx   . Liver disease Neg Hx    Social History  Substance Use Topics  . Smoking status: Former Smoker -- 0.25 packs/day for 20 years    Types: Cigarettes    Quit date: 11/05/2008  . Smokeless tobacco: Never Used     Comment: Never really smoked much  . Alcohol Use: No   OB History    Gravida Para Term Preterm AB TAB SAB Ectopic Multiple Living   1 1 1       1      Review of Systems  Constitutional: Positive for activity change and appetite change. Negative for fatigue.  HENT: Negative for congestion.   Respiratory: Negative for cough, chest tightness and shortness of breath.   Cardiovascular: Negative  for chest pain.  Gastrointestinal: Positive for nausea and abdominal pain. Negative for vomiting and diarrhea.  Genitourinary: Negative for frequency, vaginal bleeding, vaginal discharge and dyspareunia.  Musculoskeletal: Negative for myalgias and arthralgias.  Skin: Negative for rash.  Neurological: Negative for dizziness, weakness and headaches.  A complete 10 system review of systems was obtained and all systems are negative except as noted in the HPI and PMH.       Allergies  Lasix; Latex; and Morphine and related  Home Medications   Prior to Admission medications   Medication Sig Start Date End Date Taking? Authorizing Provider  lactulose (CHRONULAC) 10 GM/15ML solution TAKE 3 TEASPOONSFUL (15ML) BY MOUTH TWICE DAILY Patient taking differently: Michela Pitcher she takes about 2 tsps daily 07/22/15  Yes Mahala Menghini, PA-C  lidocaine (XYLOCAINE) 2 % solution TAKE TWO TEASPOONSFUL (10ML) BY MOUTH BEFORE MEALS AND AT BEDTIME TO PREVENT CHEST PAIN WHILE EATING Patient taking differently: Take 10 mLs by mouth 3 (three) times daily as needed (TAKE TWO TEASPOONSFUL (10ML) BY MOUTH BEFORE MEALS AND AT BEDTIME TO PREVENT CHEST PAIN WHILE EATING). TAKE TWO TEASPOONSFUL (10ML) BY MOUTH BEFORE MEALS AND AT BEDTIME TO PREVENT CHEST  PAIN WHILE EATING 08/19/15  Yes Carlis Stable, NP  LORazepam (ATIVAN) 1 MG tablet Take 0.5-1 tablets (0.5-1 mg total) by mouth 2 (two) times daily as needed (FOR BACK SPASM OR ANXIETY). FOR BACK SPASM OR ANXIETY 12/12/15  Yes Orvil Feil, NP  midodrine (PROAMATINE) 10 MG tablet Take 1 tablet (10 mg total) by mouth daily. *Takes only on dialysis days on Tuesdays, Thursdays, and Saturdays (Sometimes takes treatments on Friday instead of Saturday)**Medication to keep your blood pressure from falling. 06/09/15  Yes Cherene Altes, MD  Oxycodone HCl 10 MG TABS 1 PO TID AS NEEDED FOR PAIN Patient taking differently: Take 10 mg by mouth 3 (three) times daily as needed (for pain). FOR PAIN  09/10/15  Yes Danie Binder, MD  pantoprazole (PROTONIX) 40 MG tablet Take 1 tablet (40 mg total) by mouth 2 (two) times daily. Patient taking differently: Take 40 mg by mouth 2 (two) times daily as needed (acid reflux).  09/19/14  Yes Kathie Dike, MD  RENVELA 800 MG tablet 1 TABLET 3 TIMES A DAY WITH MEALS 10/29/15  Yes Historical Provider, MD  cephALEXin (KEFLEX) 500 MG capsule Take 1 capsule (500 mg total) by mouth 2 (two) times daily. Patient not taking: Reported on 01/02/2016 12/06/15   Carlis Stable, NP  ciprofloxacin (CIPRO) 250 MG tablet Take 1 tablet (250 mg total) by mouth daily. Patient not taking: Reported on 11/28/2015 11/07/15   Belkys A Regalado, MD  clindamycin (CLEOCIN) 300 MG capsule Take 2 capsules (600 mg total) by mouth 3 (three) times daily. Patient not taking: Reported on 11/28/2015 11/07/15   Belkys A Regalado, MD  Darbepoetin Alfa (ARANESP) 60 MCG/0.3ML SOSY injection Inject 0.3 mLs (60 mcg total) into the vein every Thursday with hemodialysis. 06/09/15   Cherene Altes, MD  promethazine (PHENERGAN) 12.5 MG tablet TAKE ONE TABLET BY MOUTH EVERY 6 HOURS AS NEEDED FOR NAUSEA AND VOMITING. 09/06/15   Mahala Menghini, PA-C  sucralfate (CARAFATE) 1 GM/10ML suspension Take 10 mLs (1 g total) by mouth 4 (four) times daily. Patient not taking: Reported on 01/02/2016 07/10/15   Danie Binder, MD   BP 104/60 mmHg  Pulse 89  Temp(Src) 97.2 F (36.2 C) (Rectal)  Resp 14  Ht 5\' 1"  (1.549 m)  Wt 160 lb (72.576 kg)  BMI 30.25 kg/m2  SpO2 97%  LMP 09/19/2013 Physical Exam  Constitutional: She is oriented to person, place, and time. She appears well-developed and well-nourished. No distress.  Chronically ill appearing  HENT:  Head: Normocephalic and atraumatic.  Mouth/Throat: Oropharynx is clear and moist. No oropharyngeal exudate.  Eyes: Conjunctivae and EOM are normal. Pupils are equal, round, and reactive to light.  Neck: Normal range of motion. Neck supple.  No meningismus.   Cardiovascular: Normal rate, regular rhythm, normal heart sounds and intact distal pulses.   No murmur heard. Pulmonary/Chest: Effort normal and breath sounds normal. No respiratory distress.  Abdominal: Soft. She exhibits distension. There is tenderness. There is no rebound and no guarding.  Distended abdomen, diffusely tender, reducible umbilical hernia , Positive ascites with fluid wave. No guarding or rebound. No areas of active cellulitis  Musculoskeletal: Normal range of motion. She exhibits edema. She exhibits no tenderness.  +3 pitting edema to knees bilaterally  Fistula to LUE with positive thrill  Neurological: She is alert and oriented to person, place, and time. No cranial nerve deficit. She exhibits normal muscle tone. Coordination normal.  No ataxia on finger to nose  bilaterally. No pronator drift. 5/5 strength throughout. CN 2-12 intact.Equal grip strength. Sensation intact.   Skin: Skin is warm.  Psychiatric: She has a normal mood and affect. Her behavior is normal.  Nursing note and vitals reviewed.   ED Course  .Paracentesis Date/Time: 01/02/2016 10:56 PM Performed by: Ezequiel Essex Authorized by: Ezequiel Essex Consent: Verbal consent obtained. Written consent obtained. Risks and benefits: risks, benefits and alternatives were discussed Consent given by: patient Patient understanding: patient states understanding of the procedure being performed Patient consent: the patient's understanding of the procedure matches consent given Procedure consent: procedure consent matches procedure scheduled Relevant documents: relevant documents present and verified Test results: test results available and properly labeled Site marked: the operative site was marked Imaging studies: imaging studies available Required items: required blood products, implants, devices, and special equipment available Patient identity confirmed: provided demographic data and verbally with  patient Time out: Immediately prior to procedure a "time out" was called to verify the correct patient, procedure, equipment, support staff and site/side marked as required. Initial or subsequent exam: initial Procedure purpose: diagnostic Indications: abdominal discomfort secondary to ascites Anesthesia: local infiltration Local anesthetic: lidocaine 1% without epinephrine Anesthetic total: 2 ml Patient sedated: no Preparation: Patient was prepped and draped in the usual sterile fashion. Needle gauge: 20 Ultrasound guidance: yes Puncture site: left lower quadrant Fluid removed: 50(ml) Fluid appearance: cloudy and serous Dressing: 4x4 sterile gauze Patient tolerance: Patient tolerated the procedure well with no immediate complications   (including critical care time) Labs Review Labs Reviewed  LIPASE, BLOOD - Abnormal; Notable for the following:    Lipase 129 (*)    All other components within normal limits  COMPREHENSIVE METABOLIC PANEL - Abnormal; Notable for the following:    Sodium 132 (*)    Chloride 94 (*)    CO2 19 (*)    BUN 76 (*)    Creatinine, Ser 8.63 (*)    Calcium 8.6 (*)    Albumin 3.3 (*)    AST 105 (*)    Alkaline Phosphatase 273 (*)    Total Bilirubin 3.7 (*)    GFR calc non Af Amer 5 (*)    GFR calc Af Amer 6 (*)    Anion gap 19 (*)    All other components within normal limits  CBC - Abnormal; Notable for the following:    RBC 3.29 (*)    Hemoglobin 11.5 (*)    HCT 35.7 (*)    MCV 108.5 (*)    MCH 35.0 (*)    Platelets 94 (*)    All other components within normal limits  URINALYSIS, ROUTINE W REFLEX MICROSCOPIC (NOT AT [facility]) - Abnormal; Notable for the following:    Hgb urine dipstick SMALL (*)    Protein, ur 30 (*)    All other components within normal limits  PROTIME-INR - Abnormal; Notable for the following:    Prothrombin Time 15.6 (*)    All other components within normal limits  TROPONIN I - Abnormal; Notable for the following:     Troponin I 0.04 (*)    All other components within normal limits  SYNOVIAL CELL COUNT + DIFF, W/ CRYSTALS - Abnormal; Notable for the following:    Appearance-Synovial CLEAR (*)    All other components within normal limits  URINE MICROSCOPIC-ADD ON - Abnormal; Notable for the following:    Squamous Epithelial / LPF 6-30 (*)    Bacteria, UA FEW (*)    Casts HYALINE CASTS (*)  All other components within normal limits  CULTURE, BLOOD (ROUTINE X 2)  CULTURE, BLOOD (ROUTINE X 2)  GRAM STAIN  CULTURE, BODY FLUID-BOTTLE  BODY FLUID CULTURE  GLUCOSE, SYNOVIAL FLUID  TROPONIN I  CBG MONITORING, ED    Imaging Review Ct Abdomen Pelvis Wo Contrast  01/02/2016  CLINICAL DATA:  Acute onset of generalized abdominal pain. Initial encounter. EXAM: CT ABDOMEN AND PELVIS WITHOUT CONTRAST TECHNIQUE: Multidetector CT imaging of the abdomen and pelvis was performed following the standard protocol without IV contrast. COMPARISON:  CT of the abdomen and pelvis from 08/20/2015 FINDINGS: Mild bibasilar atelectasis is noted. A relatively large amount of ascites is noted filling the abdomen and pelvis. The nodular contour of the liver is compatible with hepatic cirrhosis. The spleen is unremarkable in appearance. Scattered gastric and splenic varices are noted. Varices are seen extending anterior to the right kidney. Stones are seen dependently within the gallbladder. The gallbladder is otherwise unremarkable. The pancreas and adrenal glands are unremarkable. The kidneys are unremarkable in appearance. There is no evidence of hydronephrosis. No renal or ureteral stones are seen. No perinephric stranding is appreciated. No free fluid is identified. The small bowel is unremarkable in appearance. The stomach is within normal limits. No acute vascular abnormalities are seen. Mild scattered calcification is seen along the abdominal aorta and its branches. A small umbilical hernia is seen, containing fat and fluid. The  appendix is not well seen; there is no evidence for appendicitis. The colon is largely decompressed and grossly unremarkable. The bladder is not well seen. The uterus is grossly unremarkable in appearance. The ovaries are relatively symmetric. No inguinal lymphadenopathy is seen. No acute osseous abnormalities are identified. IMPRESSION: 1. Relatively large amount of ascites noted filling the abdomen and pelvis. 2. Findings of hepatic cirrhosis. Scattered gastric and splenic varices noted. Varices seen extending anterior to the right kidney. 3. Cholelithiasis.  Gallbladder otherwise unremarkable. 4. Small umbilical hernia, containing fat and fluid. 5. Mild scattered calcification along the abdominal aorta and its branches. 6. Mild bibasilar atelectasis noted. Electronically Signed   By: Garald Balding M.D.   On: 01/02/2016 21:31   I have personally reviewed and evaluated these images and lab results as part of my medical decision-making.   EKG Interpretation   Date/Time:  Thursday Jan 02 2016 19:42:02 EDT Ventricular Rate:  100 PR Interval:  178 QRS Duration: 85 QT Interval:  348 QTC Calculation: 449 R Axis:   62 Text Interpretation:  Sinus tachycardia Biatrial enlargement No  significant change was found Confirmed by Wyvonnia Dusky  MD, Kaneesha Constantino [phone]) on  01/02/2016 7:44:53 PM      MDM   Final diagnoses:  Abdominal pain, unspecified abdominal location  Ascites   Patient with history of cirrhosis and ascites presenting with worsening upper abdominal pain and nausea. No fever. Last paracentesis on May 12.  Patient is afebrile. No nausea or vomiting. No leukocytosis.  Concern for possible SBP. Patient with significant ascites and diffuse abdominal tenderness.  Diagnostic paracentesis performed. There is no evidence of SBP on ascitic fluid. Negative Gram stain.   Patient tolerating by mouth in the ED. Her labs do show elevated bilirubin more than her baseline. She is due for dialysis  tomorrow. Lipase 129. Minimal troponin elevation likely secondary to ESRD. No chest pain. CT scan reassuring.  Gallstones present without evidence of cholecystitis.  Advised her to call for a appointment for paracentesis through her GI doctor. She is due for dialysis tomorrow. No indication for emergent  dialysis tonight.  Pain has improved in the ED and she is tolerating PO. Return precautions discussed.  Ezequiel Essex, MD 01/03/16 [phone]

## 2016-01-02 NOTE — ED Notes (Signed)
Pt given crackers and peanut butter.

## 2016-01-03 LAB — SYNOVIAL CELL COUNT + DIFF, W/ CRYSTALS
CRYSTALS FLUID: NONE SEEN
Eosinophils-Synovial: 0 % (ref 0–1)
Lymphocytes-Synovial Fld: 75 % — ABNORMAL HIGH (ref 0–20)
Monocyte-Macrophage-Synovial Fluid: 21 % — ABNORMAL LOW (ref 50–90)
NEUTROPHIL, SYNOVIAL: 4 % (ref 0–25)
WBC, SYNOVIAL: 58 /mm3 (ref 0–200)

## 2016-01-03 LAB — TROPONIN I: Troponin I: <0.03 ng/mL (ref <0.031)

## 2016-01-03 NOTE — ED Provider Notes (Signed)
Patient initially seen and evaluated by Dr. Wyvonnia Dusky, paracentesis shows no evidence of spontaneous bacterial peritonitis. Patient was kept in the ED for repeat troponin because of initial troponin 0.4. Repeat troponin is 0.3. Patient is discharged with instructions to go to her regularly scheduled dialysis in the morning.  Delora Fuel, MD [phone]

## 2016-01-03 NOTE — Discharge Instructions (Signed)
Ascites Call your doctor to schedule a paracentesis. Return to the ED if you develop fever, vomiting, worsening pain, or any other concerns. Ascites is a collection of excess fluid in the abdomen. Ascites can range from mild to severe. It can get worse without treatment. CAUSES Possible causes include:  Cirrhosis. This is the most common cause of ascites.  Infection or inflammation in the abdomen.  Cancer in the abdomen.  Heart failure.  Kidney disease.  Inflammation of the pancreas.  Clots in the veins of the liver. SIGNS AND SYMPTOMS Signs and symptoms may include:  A feeling of fullness in your abdomen. This is common.  An increase in the size of your abdomen or your waist.  Swelling in your legs.  Swelling of the scrotum in men.  Difficulty breathing.  Abdominal pain.  Sudden weight gain. If the condition is mild, you may not have symptoms. DIAGNOSIS To make a diagnosis, your health care provider will:  Ask about your medical history.  Perform a physical exam.  Order imaging tests, such as an ultrasound or CT scan of your abdomen. TREATMENT Treatment depends on the cause of the ascites. It may include:  Taking a pill to make you urinate. This is called a water pill (diuretic pill).  Strictly reducing your salt (sodium) intake. Salt can cause extra fluid to be kept in the body, and this makes ascites worse.  Having a procedure to remove fluid from your abdomen (paracentesis).  Having a procedure to transfer fluid from your abdomen into a vein.  Having a procedure that connects two of the major veins within your liver and relieves pressure on your liver (TIPS procedure). Ascites may go away or improve with treatment of the condition that caused it.  HOME CARE INSTRUCTIONS  Keep track of your weight. To do this, weigh yourself at the same time every day and record your weight.  Keep track of how much you drink and any changes in the amount you  urinate.  Follow any instructions that your health care provider gives you about how much to drink.  Try not to eat salty (high-sodium) foods.  Take medicines only as directed by your health care provider.  Keep all follow-up visits as directed by your health care provider. This is important.  Report any changes in your health to your health care provider, especially if you develop new symptoms or your symptoms get worse. SEEK MEDICAL CARE IF:  Your gain more than 3 pounds in 3 days.  Your abdominal size or your waist size increases.  You have new swelling in your legs.  The swelling in your legs gets worse. SEEK IMMEDIATE MEDICAL CARE IF:  You develop a fever.  You develop confusion.  You develop new or worsening difficulty breathing.  You develop new or worsening abdominal pain.  You develop new or worsening swelling in the scrotum (in men).   This information is not intended to replace advice given to you by your health care provider. Make sure you discuss any questions you have with your health care provider.   Document Released: 07/27/2005 Document Revised: 08/17/2014 Document Reviewed: 02/23/2014 Elsevier Interactive Patient Education [employer].

## 2016-01-07 LAB — CULTURE, BLOOD (ROUTINE X 2)
CULTURE: NO GROWTH
CULTURE: NO GROWTH

## 2016-01-07 LAB — CULTURE, BODY FLUID W GRAM STAIN -BOTTLE: Culture: NO GROWTH

## 2016-01-08 ENCOUNTER — Telehealth: Payer: Self-pay | Admitting: Gastroenterology

## 2016-01-08 NOTE — Telephone Encounter (Signed)
I called pt and she said that when she last talked to Dr. Oneida Alar that she told her to call the office if she felt like she needed another antibiotic. Please advise!  She has the appointment with Dr. Johnnye Sima on 01/14/2016 and said she would keep the appt.  Pt also wants to be scheduled for a para, per Candy pt was told to call radiology since she has standing orders.  Pt will call back if there is a problem.

## 2016-01-08 NOTE — Telephone Encounter (Signed)
Pt called this afternoon asking to speak with the nurse or either leave her a message that she thinks she needs an antibiotic. I transferred call to VM

## 2016-01-09 ENCOUNTER — Ambulatory Visit (HOSPITAL_COMMUNITY)
Admission: RE | Admit: 2016-01-09 | Discharge: 2016-01-09 | Disposition: A | Discharge status: Home / Self Care | Payer: Medicare Other | Source: Ambulatory Visit | Attending: Gastroenterology | Admitting: Gastroenterology

## 2016-01-09 DIAGNOSIS — R188 Other ascites: Secondary | ICD-10-CM

## 2016-01-09 MED ORDER — ALBUMIN HUMAN 25 % IV SOLN
25.0000 g | Freq: Once | INTRAVENOUS | Status: AC
  Administered 2016-01-09: 25 g via INTRAVENOUS

## 2016-01-09 MED ORDER — ALBUMIN HUMAN 25 % IV SOLN
INTRAVENOUS | Status: AC
  Administered 2016-01-09: 25 g via INTRAVENOUS
  Filled 2016-01-09: qty 100

## 2016-01-09 NOTE — Telephone Encounter (Signed)
PLEASE CALL PT. SHE CAN STOP BY OFFICE TODAY BEFORE 3 PM OR COME BY ENDO TOMORROW.

## 2016-01-09 NOTE — Telephone Encounter (Signed)
Tried to call, could not leave a message.

## 2016-01-09 NOTE — Telephone Encounter (Signed)
[name] is aware for pt to go by Endo tomorrow.

## 2016-01-09 NOTE — Progress Notes (Signed)
Paracentesis complete no signs of distress. 6247ml yellow colored ascites removed.

## 2016-01-10 ENCOUNTER — Other Ambulatory Visit: Payer: Self-pay | Admitting: Gastroenterology

## 2016-01-10 ENCOUNTER — Telehealth: Payer: Self-pay | Admitting: Gastroenterology

## 2016-01-10 NOTE — Telephone Encounter (Addendum)
PT SEEN IN ENDOSCOPY. NO EVIDENCE OF CELLULITIS. PT HAS OPEN WOUND ON UMBILICUS-MUCOPURULENT DRAINAGE WITH NO SURROUNDING ERYTHEMA. CLEANED WITH SALINE AND NONSTICK GAUZE APPLIED. ASKED PT TO KEEP APPT WITH ID. DIAL SOAP/WATER TO UMBILICUS TID. DO NOT PUT ALCOHOL/PEROXIDE ON SKIN OR UMBILICUS.

## 2016-01-14 ENCOUNTER — Ambulatory Visit (INDEPENDENT_AMBULATORY_CARE_PROVIDER_SITE_OTHER): Payer: Medicare Other | Admitting: Infectious Diseases

## 2016-01-14 ENCOUNTER — Encounter: Payer: Self-pay | Admitting: Infectious Diseases

## 2016-01-14 VITALS — BP 114/68 | HR 103 | Temp 98.4°F

## 2016-01-14 DIAGNOSIS — N186 End stage renal disease: Secondary | ICD-10-CM

## 2016-01-14 DIAGNOSIS — Z992 Dependence on renal dialysis: Secondary | ICD-10-CM

## 2016-01-14 DIAGNOSIS — L03311 Cellulitis of abdominal wall: Secondary | ICD-10-CM

## 2016-01-14 NOTE — Progress Notes (Signed)
   Subjective:    Patient ID: Wanda [name], female    DOB: September 22, 1975, 40 y.o.   MRN: [id number]  HPI 40 yo F with hx of NASH and cirrhosis. She has had eval for txp at [facility] 11-18-15.  She also had esophageal banding for varicies 07-2015.  She also has ESRD. This has been for the last year.  She was adm to hospital 3-28 for abd wall cellulitis. She was treated with vanco/ceftriaxone in hospital, d/c on clinda/cipro.   She had f/u with her PCP and was given a course of keflex 11-28-15 when her cellulitis was not felt to be improving.  She feels like this has been coming back every month. Has transient relief with antibiotics.  She is worried she might get C diff but has never tested positive for this.  She is currently off atbx, Has had intermittent pain and swelling of her abd. Has had leakage from her herniated umbilicus.  No fever or chills.  Per her spouse, her last paracentesis was 01-09-16  The past medical history, family history and social history were reviewed/updated in EPIC   Review of Systems  Constitutional: Negative for fever and chills.  Gastrointestinal: Negative for diarrhea and constipation.  Genitourinary: Negative for difficulty urinating.  Skin: Positive for rash.       Objective:   Physical Exam  Constitutional: She appears well-developed and well-nourished.  HENT:  Mouth/Throat: No oropharyngeal exudate.  Eyes: EOM are normal. Pupils are equal, round, and reactive to light.  Neck: Neck supple.  Cardiovascular: Normal rate, regular rhythm and normal heart sounds.   Pulmonary/Chest: Effort normal and breath sounds normal.  Abdominal: Soft. Bowel sounds are normal. She exhibits distension.    Lymphadenopathy:    She has no cervical adenopathy.       Assessment & Plan:

## 2016-01-14 NOTE — Assessment & Plan Note (Signed)
Her fistula site is clean.  She will f/u with her renal MD.

## 2016-01-14 NOTE — Assessment & Plan Note (Signed)
I do not currently see any signs of infection. She is afebrile. There is no increase in heat on her skin. There is some erythema but I believe this is due to her cirrhosis.  She has tenderness at her umblicus and distension, I suspect this is due more to her cirrhosis.  i would consider that she needs repeat paracentesis, will defer this to her GI MD and her PCP.  It is possible that she has superficial fungal skin infection, I would have expected this to be localized around her umbilucus where there was fluid drainage. She could try topical antifungal. Would be hesitant to use po with her hx of liver disease (as diflucan is metabolized through the liver).  She will f/u with her PCP

## 2016-01-16 ENCOUNTER — Telehealth: Payer: Self-pay | Admitting: Gastroenterology

## 2016-01-16 ENCOUNTER — Telehealth: Payer: Self-pay

## 2016-01-16 NOTE — Telephone Encounter (Signed)
Pt is on the July recall to have U/S Liver

## 2016-01-16 NOTE — Telephone Encounter (Signed)
Faxed Dr. Algis Downs office note to Dr. [name] per Dr. Algis Downs request. Fax returned OK status.

## 2016-01-16 NOTE — Telephone Encounter (Signed)
Mailed letter °

## 2016-01-19 IMAGING — US US PARACENTESIS
1 series · 2 of 2 positions shown · non-contrast
Comparison: 05/29/2015

CLINICAL DATA: Cirrhosis, ascites

EXAM:
ULTRASOUND GUIDED DIAGNOSTIC AND THERAPEUTIC PARACENTESIS

[Series 1: us paracentesis · 0.24mm/px · 2 of 2 slices shown]
[im 1/2]
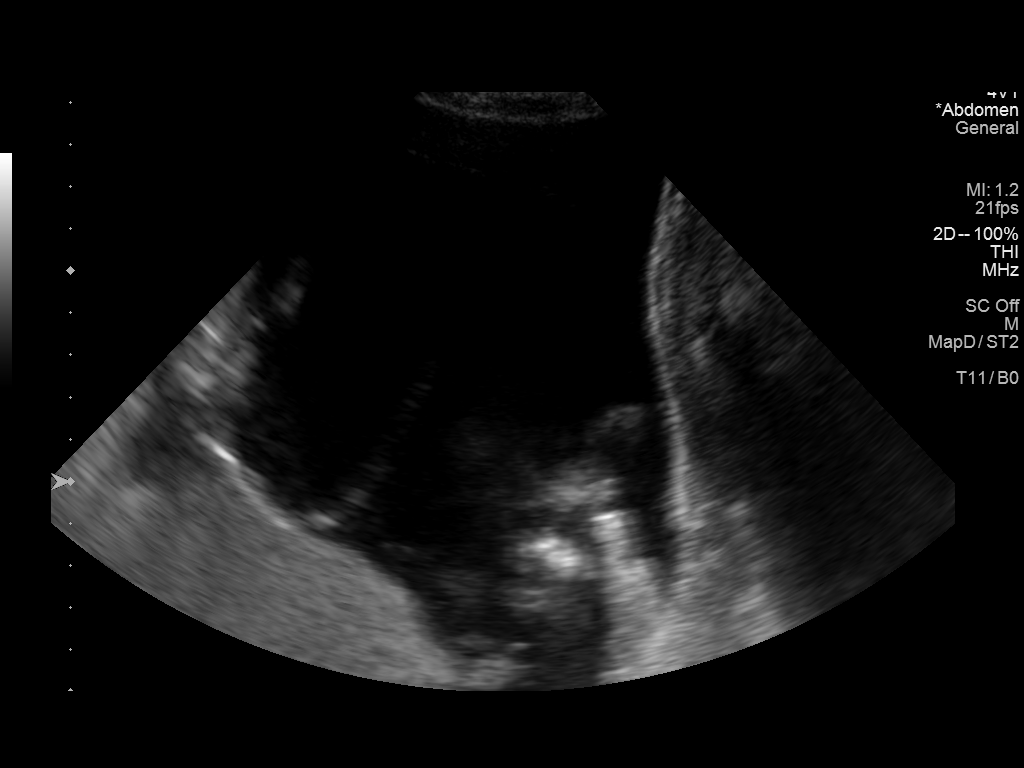
[im 2/2]
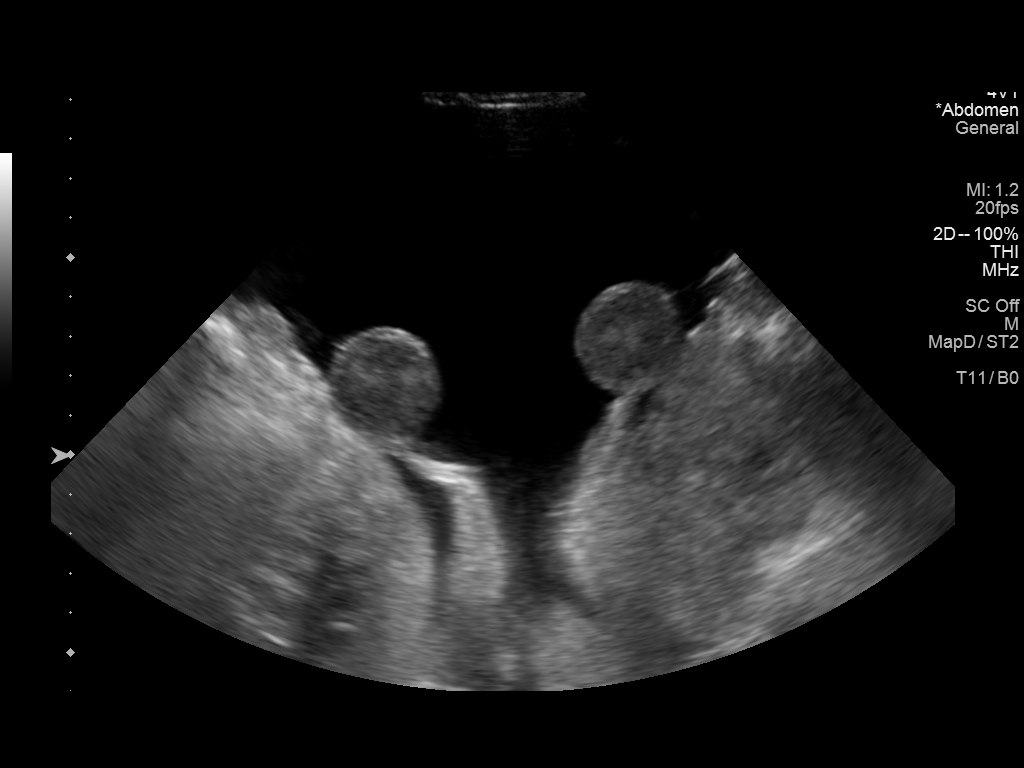

[2 of 2 positions shown; findings below may reference images not displayed]

PROCEDURE:
Procedure, benefits, and risks of procedure were discussed with
patient.

Written informed consent for procedure was obtained.

Time out protocol followed.

Adequate collection of ascites localized by ultrasound in RIGHT
lower quadrant.

Skin prepped and draped in usual sterile fashion.

Skin and soft tissues anesthetized with 10 mL of 1% lidocaine.

5 French Yueh catheter placed into peritoneal cavity.

5688 mL of yellow fluid aspirated by vacuum bottle suction.

Procedure tolerated well by patient without immediate complication.

COMPLICATIONS:
None.
FINDINGS: A total of approximately 5688 mL of ascitic fluid was removed. A
fluid sample of 180 mL was sent for laboratory analysis.
IMPRESSION: Successful ultrasound guided paracentesis yielding 5688 mL of
ascites.

## 2016-01-23 ENCOUNTER — Telehealth: Payer: Self-pay | Admitting: Gastroenterology

## 2016-01-23 NOTE — Telephone Encounter (Signed)
PT is aware I have sent the previous note to Dr. Oneida Alar to advise.

## 2016-01-23 NOTE — Telephone Encounter (Signed)
PATIENT CALLED WANTING TO KNOW IF SHE COULD GET AN ANTIBIOTIC BEFORE HER APPT NEXT WEEK   PLEASE CALL

## 2016-01-23 NOTE — Telephone Encounter (Signed)
Forwarding to Dr. Oneida Alar for recommendation. ( Pt has seen Dr. Johnnye Sima).

## 2016-01-23 NOTE — Telephone Encounter (Signed)
Pt called this afternoon to see if we had tried calling her back from earlier. I told her that DS was not in her office and I would let her know that she had called. I told her that she was waiting on recommendations from [facility].

## 2016-01-24 MED ORDER — CLOTRIMAZOLE 1 % EX CREA: TOPICAL_CREAM | CUTANEOUS | Status: DC

## 2016-01-24 NOTE — Telephone Encounter (Signed)
Tried to call pt. Not accepting calls at this time.

## 2016-01-24 NOTE — Telephone Encounter (Signed)
PLEASE CALL PT. I REVIEWED DR. HATCHER'S NOTE. HE AGREED WITH ME THAT THE SKIN IS RED BUT NOT INFECTED. SHE DOS NOT NEED ABX BUT SHE CAN PUT ANTIFUNGAL CREAM ON HER SKIN. RX FOR LOTRIMIN SENT.

## 2016-01-24 NOTE — Telephone Encounter (Signed)
REVIEWED-NO ADDITIONAL RECOMMENDATIONS. 

## 2016-01-27 IMAGING — US US PARACENTESIS
1 series · 1 of 1 positions shown · non-contrast
Comparison: 06/19/2015

CLINICAL DATA: Cirrhosis secondary to NASH, ascites

EXAM:
ULTRASOUND GUIDED DIAGNOSTIC AND THERAPEUTIC PARACENTESIS

[Series 1: us paracentesis · 0.17mm/px · 1 of 1 slices shown]
[im 1/1]
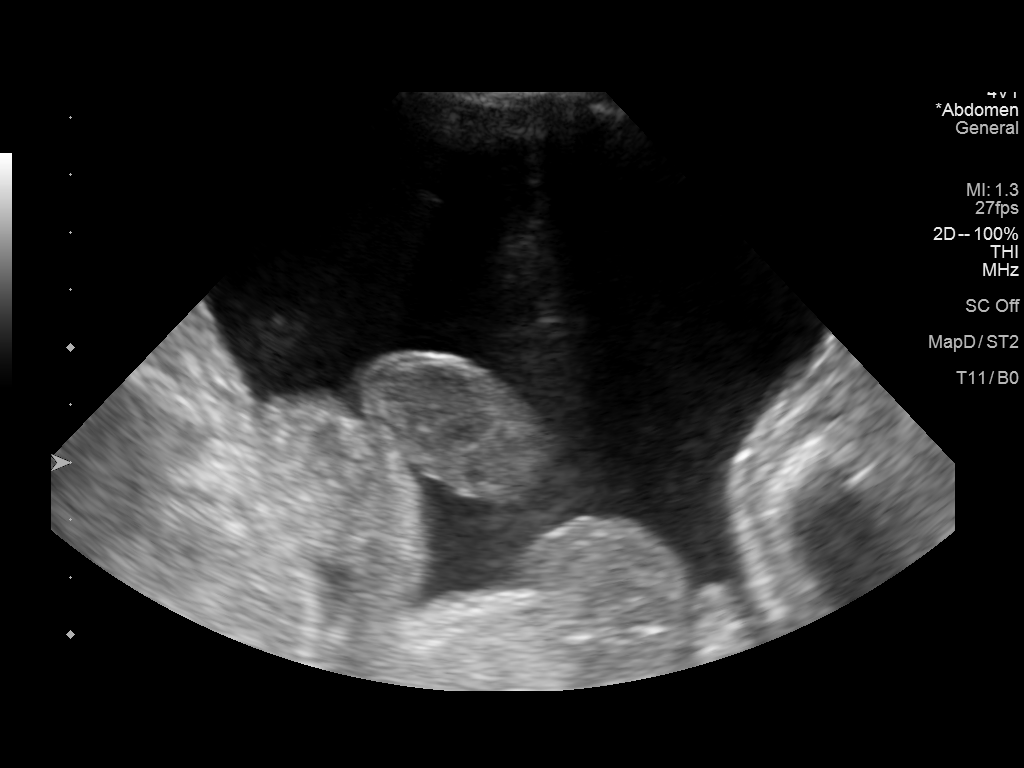

[1 of 1 positions shown; findings below may reference images not displayed]

PROCEDURE:
Procedure, benefits, and risks of procedure were discussed with
patient.

Written informed consent for procedure was obtained.

Time out protocol followed.

Adequate collection of ascites localized by ultrasound in LEFT lower
quadrant.

Skin prepped and draped in usual sterile fashion.

Skin and soft tissues anesthetized with 10 mL of 1% lidocaine.

5 French Yueh catheter placed into peritoneal cavity.

3977 mL of yellow ascites aspirated by vacuum bottle suction.

Procedure tolerated well by patient without immediate complication.

COMPLICATIONS:
None.
FINDINGS: A total of approximately 3977 mL of ascitic fluid was removed. A
fluid sample of 180 mL was sent for laboratory analysis.
IMPRESSION: Successful ultrasound guided paracentesis yielding 3977 mL of
ascites.

c

## 2016-01-27 NOTE — Telephone Encounter (Signed)
Tried to call pt. Not accepting calls. [name]'s phone not working now.

## 2016-01-28 NOTE — Telephone Encounter (Signed)
Letter mailed to pt with the info.  

## 2016-01-29 ENCOUNTER — Ambulatory Visit (INDEPENDENT_AMBULATORY_CARE_PROVIDER_SITE_OTHER): Payer: Medicare Other | Admitting: Gastroenterology

## 2016-01-29 ENCOUNTER — Ambulatory Visit (HOSPITAL_COMMUNITY)
Admission: RE | Admit: 2016-01-29 | Discharge: 2016-01-29 | Disposition: A | Discharge status: Home / Self Care | Payer: Medicare Other | Source: Ambulatory Visit | Attending: Gastroenterology | Admitting: Gastroenterology

## 2016-01-29 ENCOUNTER — Encounter (HOSPITAL_COMMUNITY): Payer: Self-pay

## 2016-01-29 ENCOUNTER — Encounter: Payer: Self-pay | Admitting: Gastroenterology

## 2016-01-29 ENCOUNTER — Other Ambulatory Visit: Payer: Self-pay

## 2016-01-29 VITALS — BP 102/67 | HR 107 | Temp 98.2°F | Ht 61.0 in | Wt 135.0 lb

## 2016-01-29 DIAGNOSIS — K7031 Alcoholic cirrhosis of liver with ascites: Secondary | ICD-10-CM

## 2016-01-29 DIAGNOSIS — K7581 Nonalcoholic steatohepatitis (NASH): Secondary | ICD-10-CM | POA: Diagnosis not present

## 2016-01-29 DIAGNOSIS — K429 Umbilical hernia without obstruction or gangrene: Secondary | ICD-10-CM

## 2016-01-29 DIAGNOSIS — I8510 Secondary esophageal varices without bleeding: Secondary | ICD-10-CM

## 2016-01-29 DIAGNOSIS — K7460 Unspecified cirrhosis of liver: Secondary | ICD-10-CM | POA: Diagnosis not present

## 2016-01-29 DIAGNOSIS — R188 Other ascites: Secondary | ICD-10-CM

## 2016-01-29 MED ORDER — OXYCODONE HCL 10 MG PO TABS: ORAL_TABLET | ORAL | Status: DC

## 2016-01-29 MED ORDER — ALBUMIN HUMAN 25 % IV SOLN
INTRAVENOUS | Status: AC
  Administered 2016-01-29: 25 g via INTRAVENOUS
  Filled 2016-01-29: qty 100

## 2016-01-29 MED ORDER — ALBUMIN HUMAN 25 % IV SOLN
25.0000 g | Freq: Once | INTRAVENOUS | Status: AC
  Administered 2016-01-29: 25 g via INTRAVENOUS

## 2016-01-29 MED ORDER — LORAZEPAM 1 MG PO TABS: ORAL_TABLET | ORAL | Status: DC

## 2016-01-29 NOTE — Patient Instructions (Addendum)
PARACENTESIS TODAY.  LOTRIMIN 3 TIMES A DAY TO BELLY BUTTON FOR 10 DAYS.  REFILL OXYCODONE AND ATIVAN  FOLLOW UP IN 6 MOS.

## 2016-01-29 NOTE — Progress Notes (Signed)
Paracentesis complete no signs of distress. 5200 ml yellow colored ascites removed.  

## 2016-01-29 NOTE — Assessment & Plan Note (Addendum)
PARTIALLY COMPENSATED DISEASE.

## 2016-01-29 NOTE — Progress Notes (Addendum)
Subjective:    Patient ID: Wanda [name], female    DOB: 03/14/1976, 40 y.o.   MRN: [id number]  [name], MD  HPI THINKS SHE NEEDS ABX. SAW DR. HATCHER AND DIDN'T THINK SHE HAS INFECTION. STOMACH STILL SWOLLEN. CAN DO PARACENTESIS ON WED.HAS ABDOMINAL PAIN: ON SIDES AND CAN COME TO MIDDLE. ITCHES AND BURNS AND FEELS SOMETHING MOVING AROUND IN THERE. BMs: NOT LOOSE, SEMI-SOLID. NAUSEA IF HAS BUILD UP OF ACID. MAY USE LIDOCAINE IF NEEDED FOR CHEST PAIN. HAVING SPASM IN HAND AND BACK. GOING TO HD: 3 DAYS/WEEK. COUPLE TIMES A WEEK. WATCHING SALT IN DIET.   PT DENIES FEVER, CHILLS, HEMATOCHEZIA, nausea, vomiting, melena, DIARRHEA, CHEST PAIN, SHORTNESS OF BREATH, CHANGE IN BOWEL IN HABITS, constipation, problems swallowing, OR heartburn or indigestion.   Past Medical History  Diagnosis Date  . GERD (gastroesophageal reflux disease)   . Cirrhosis ([place]) 10/05/13    Liver bx 11/23/13 (delayed initially due to patient refusal). c/w steatohepatitis  . Folate deficiency 09/2013  . Anasarca 10/10/2013  . Hematemesis/vomiting blood 02/24/2014  . Acute blood loss anemia 02/25/2014    Status post transfusion  . Macrocytosis 02/28/2014  . Bleeding esophageal varices ([place]) 02/28/2014    s/p banding  . Acute renal failure ([place]) 09/2013    Pre-renal- resolved  . C. difficile colitis 04/19/2014  . Anxiety   . Depression   . Thrombocytopenia ([place])     Hypercellular bone marrow; abundant megakaryocytes per 08/27/2014; s/p bone marrow bx  . Cirrhosis of liver with ascites ([place])   . SBP (spontaneous bacterial peritonitis) ([place]) 11/10/2013  . Bipolar disorder ([place]) 12/04/2013    2007-SEEN IN ED FOR INVOLUNTARY COMMITMENT, UDS POS FOR AMPHETAMINES/OPIATES   . PNA (pneumonia) 10/13/2013  . Chronic hypotension   . Gastroesophageal junction ulcer 09/17/2014  . ESRD (end stage renal disease) on dialysis ([place]) 08/2014   .[place] Allergies  Allergen Reactions  . Lasix [Furosemide]     "doesn't work"  . Latex  Itching  . Morphine And Related Hives and Itching    Current Outpatient Prescriptions  Medication Sig Dispense Refill  . clotrimazole (LOTRIMIN) 1 % cream TOP TID TO SKIN FOR 10 DAYS    . ARANESP 60 MCG/0.3ML injection Inject 0.3 mLs (60 mcg total) into the vein every Thursday with hemodialysis.    . CHRONULAC 10 GM/15ML solution TAKE 3 TEASPOONSFUL (15ML) BY MOUTH TWICE DAILY AT LEAST ONCE A DAY   . lidocaine (XYLOCAINE) 2 % solution TAKE TWO TEASPOONSFUL (10ML) BY MOUTH BEFORE MEALS AND AT BEDTIME TO PREVENT CHEST PAIN WHILE EATING (Patient taking differently: Take 10 mLs by mouth 3 (three) times daily as needed (TAKE TWO TEASPOONSFUL (10ML) BY MOUTH BEFORE MEALS AND AT BEDTIME TO PREVENT CHEST PAIN WHILE EATING). TAKE TWO TEASPOONSFUL (10ML) BY MOUTH BEFORE MEALS AND AT BEDTIME TO PREVENT CHEST PAIN WHILE EATING) COUPLE TIMES A WEEK   . LORazepam (ATIVAN) 1 MG tablet TAKE 1/2 TO 1 TABLET BID PRN FOR BACK SPASM OR FOR ANXIETY    . midodrine (PROAMATINE) 10 MG tablet Take 1 tablet mouth daily. *Medication to keep your blood pressure from falling.    . Oxycodone HCl 10 MG TABS 1 PO TID AS NEEDED FOR PAIN     . PROTONIX 40 MG tablet Take 1 tablet (40 mg total) by mouth 2 (two) times daily.     [name] PHENERGAN 12.5 MG tablet TAKE ONE TABLET POQ6H AS NEEDED FOR NAUSEA AND VOMITING.    . RENVELA 800 MG  tablet 1 TABLET 3 TIMES A DAY WITH MEALS    .       Review of Systems DRY MOUTH AND DRY NOSE. PER HPI OTHERWISE ALL SYSTEMS ARE NEGATIVE.    Objective:   Physical Exam  Constitutional: She is oriented to person, place, and time. She appears well-developed and well-nourished. No distress.  HENT:  Head: Normocephalic and atraumatic.  Mouth/Throat: Oropharynx is clear and moist. No oropharyngeal exudate.  SCLERA ANICTERIC  Eyes: Pupils are equal, round, and reactive to light. No scleral icterus.  Neck: Normal range of motion. Neck supple.  Cardiovascular: Normal rate, regular rhythm and normal heart  sounds.   Pulmonary/Chest: Effort normal and breath sounds normal. No respiratory distress.  Abdominal: Soft. Bowel sounds are normal. She exhibits distension (MODERATE). There is tenderness. There is no rebound and no guarding.  MILD TTP IN THE EPIGASTRIUM & BUQS.  Musculoskeletal: Edema: 3-4+ BIL LEs.  Lymphadenopathy:    She has no cervical adenopathy.  Neurological: She is alert and oriented to person, place, and time.  NO FOCAL DEFICITS  Skin: There is erythema (periumbilcal region and flanks).  Palmar erythema  Psychiatric:  FLAT AFFECT, NL MOOD  Vitals reviewed.     Assessment & Plan:

## 2016-01-29 NOTE — Addendum Note (Signed)
Addended by: Danie Binder on: 01/29/2016 12:20 PM   Modules accepted: Orders

## 2016-01-29 NOTE — Progress Notes (Signed)
ON RECALL  °

## 2016-01-29 NOTE — Progress Notes (Signed)
cc'ed to pcp °

## 2016-01-29 NOTE — Assessment & Plan Note (Signed)
LAST EGD FEB 2016.  NEXT EGD JUL 2016.

## 2016-01-29 NOTE — Assessment & Plan Note (Addendum)
NO SIGNS OR SYMPTOMS OF INCARCERATION.  PARACENTESIS TODAY. LOTRIMIN 3 TIMES A DAY TO BELLY BUTTON FOR 10 DAYS. FOLLOW UP IN 6 MOS.

## 2016-01-29 NOTE — Assessment & Plan Note (Signed)
PARTIALLY COMPENSATED DISEASE.  FOLLOW UP WITH [facility]. REFILL ATIVAN TID PRN #[id number].  REFILL OXYCODONE #90 FROM JUN 28-2017 THRU Sep 06, 2016. PARACENTESIS TODAY AND THEN WEEKLY. FOLLOW UP IN 6 MOS.

## 2016-01-29 NOTE — Procedures (Signed)
PreOperative Dx: Cirrhosis, ascites Postoperative Dx: Cirrhosis, ascites Procedure:   US guided paracentesis Radiologist:  Thornton Papas Anesthesia:  10 ml of1% lidocaine Specimen:  5.2 L of dark yellow ascitic fluid EBL:   < 1 ml Complications: None

## 2016-02-05 ENCOUNTER — Encounter (HOSPITAL_COMMUNITY): Payer: Self-pay

## 2016-02-05 ENCOUNTER — Ambulatory Visit (HOSPITAL_COMMUNITY)
Admission: RE | Admit: 2016-02-05 | Discharge: 2016-02-05 | Disposition: A | Discharge status: Home / Self Care | Payer: Medicare Other | Source: Ambulatory Visit | Attending: Gastroenterology | Admitting: Gastroenterology

## 2016-02-05 DIAGNOSIS — R188 Other ascites: Secondary | ICD-10-CM | POA: Diagnosis not present

## 2016-02-05 DIAGNOSIS — K7460 Unspecified cirrhosis of liver: Secondary | ICD-10-CM | POA: Diagnosis not present

## 2016-02-05 MED ORDER — ALBUMIN HUMAN 25 % IV SOLN
25.0000 g | Freq: Once | INTRAVENOUS | Status: AC
Start: 2016-02-05 — End: 2016-02-05
  Administered 2016-02-05: 25 g via INTRAVENOUS

## 2016-02-05 NOTE — Progress Notes (Signed)
Paracentesis complete no signs of distress. 4500 ml amber colored ascites removed.

## 2016-02-05 NOTE — Procedures (Signed)
PreOperative Dx: Cirrhosis, ascites Postoperative Dx: Cirrhosis, ascites Procedure:   US guided paracentesis Radiologist:  Thornton Papas Anesthesia:  10 ml of1% lidocaine Specimen:  4.5 L of amber ascitic fluid EBL:   < 1 ml Complications: None

## 2016-02-12 ENCOUNTER — Ambulatory Visit (HOSPITAL_COMMUNITY)
Admission: RE | Admit: 2016-02-12 | Discharge: 2016-02-12 | Disposition: A | Discharge status: Home / Self Care | Payer: Medicare Other | Source: Ambulatory Visit | Attending: Gastroenterology | Admitting: Gastroenterology

## 2016-02-12 ENCOUNTER — Encounter (HOSPITAL_COMMUNITY): Payer: Self-pay

## 2016-02-12 DIAGNOSIS — R188 Other ascites: Secondary | ICD-10-CM | POA: Diagnosis present

## 2016-02-12 MED ORDER — ALBUMIN HUMAN 25 % IV SOLN
25.0000 g | Freq: Once | INTRAVENOUS | Status: AC
  Administered 2016-02-12: 25 g via INTRAVENOUS

## 2016-02-12 MED ORDER — ALBUMIN HUMAN 25 % IV SOLN
INTRAVENOUS | Status: AC
  Administered 2016-02-12: 25 g via INTRAVENOUS
  Filled 2016-02-12: qty 100

## 2016-02-12 NOTE — Progress Notes (Signed)
Paracentesis complete no signs of distress. 1200 ml yellow colored ascites removed.

## 2016-02-16 IMAGING — US US PARACENTESIS
1 series · 3 of 3 positions shown · non-contrast
Comparison: None.

CLINICAL DATA: Ascites

EXAM:
ULTRASOUND GUIDED  PARACENTESIS

[Series 1: us paracentesis · 0.16mm/px · 3 of 3 slices shown]
[im 1/3]
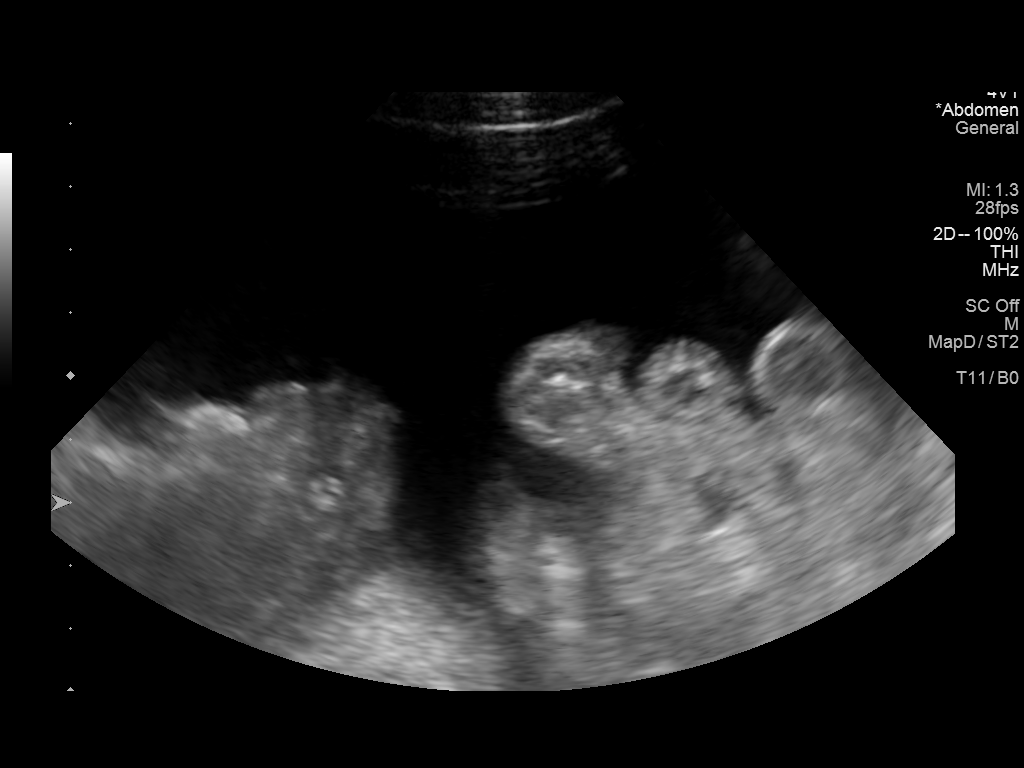
[im 2/3]
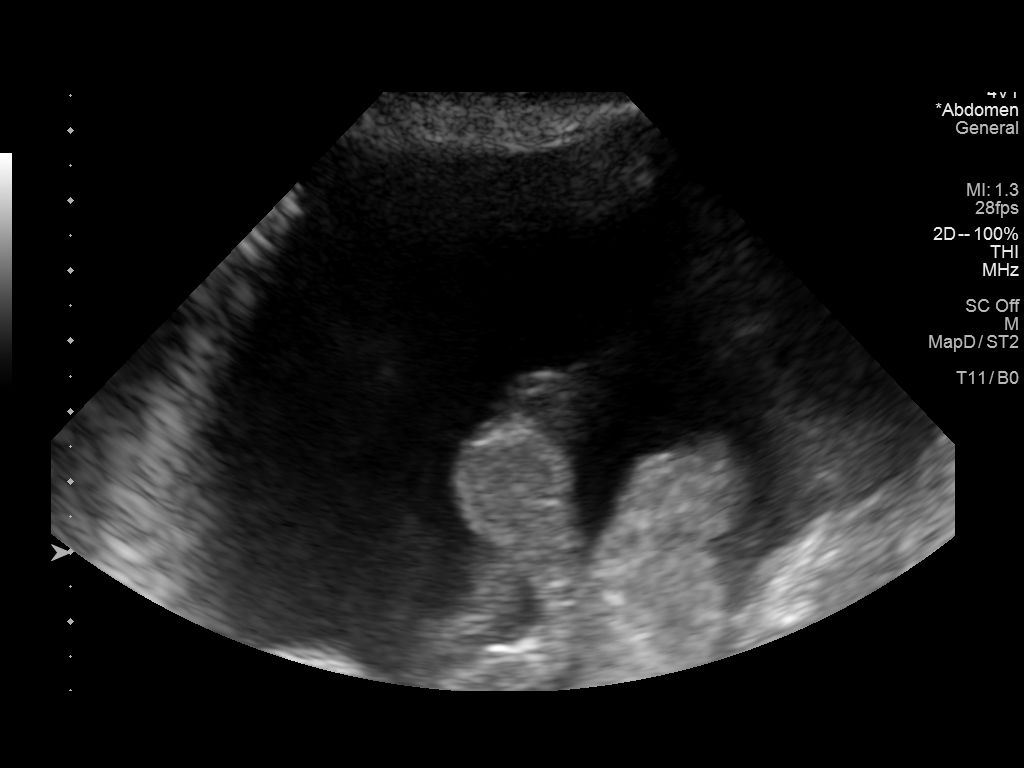
[im 3/3]
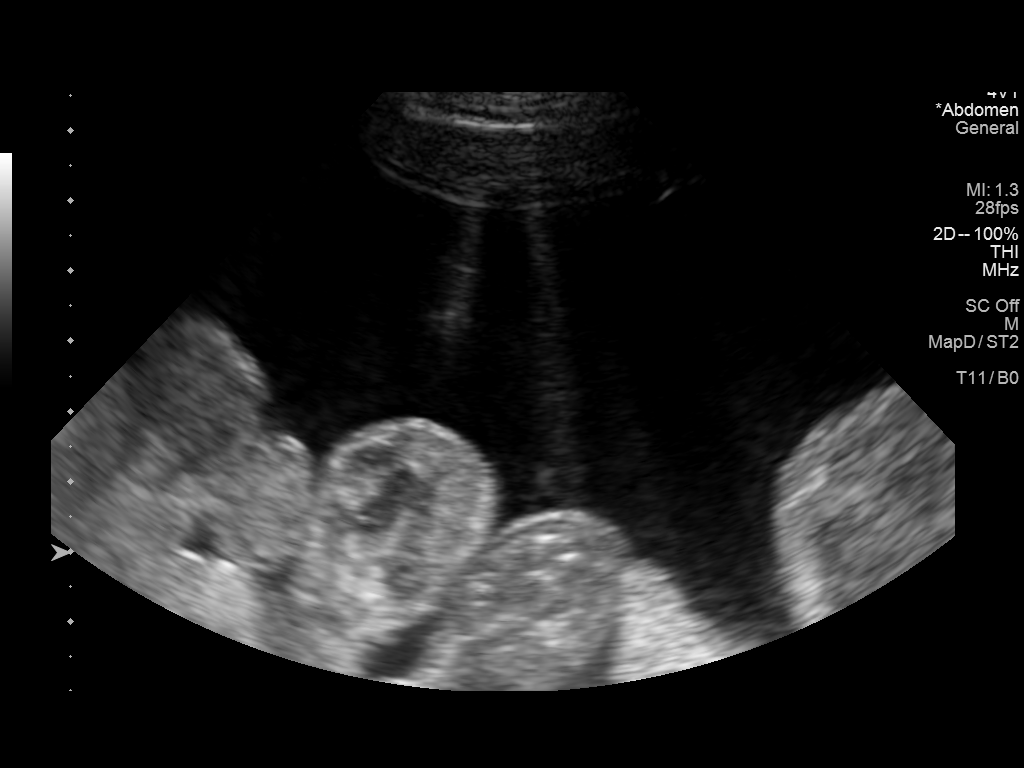

[3 of 3 positions shown; findings below may reference images not displayed]

PROCEDURE:
An ultrasound guided paracentesis was thoroughly discussed with the
patient and questions answered. The benefits, risks, alternatives
and complications were also discussed. The patient understands and
wishes to proceed with the procedure. Written consent was obtained.

Ultrasound was performed to localize and mark an adequate pocket of
fluid in the right lower quadrant of the abdomen. The area was then
prepped and draped in the normal sterile fashion. 1% Lidocaine was
used for local anesthesia. Under ultrasound guidance a 19 gauge Yueh
catheter was introduced. Paracentesis was performed. The catheter
was removed and a dressing applied.

COMPLICATIONS:
None.
FINDINGS: A total of approximately 5.3 L of clear yellow fluid was removed. A
fluid sample was sent for laboratory analysis.
IMPRESSION: Successful ultrasound guided paracentesis yielding 5.3 L of ascites.

## 2016-02-19 ENCOUNTER — Ambulatory Visit (HOSPITAL_COMMUNITY)
Admission: RE | Admit: 2016-02-19 | Discharge: 2016-02-19 | Disposition: A | Discharge status: Home / Self Care | Payer: Medicare Other | Source: Ambulatory Visit | Attending: Gastroenterology | Admitting: Gastroenterology

## 2016-02-19 DIAGNOSIS — K7460 Unspecified cirrhosis of liver: Secondary | ICD-10-CM | POA: Insufficient documentation

## 2016-02-19 DIAGNOSIS — R188 Other ascites: Secondary | ICD-10-CM | POA: Diagnosis present

## 2016-02-19 MED ORDER — ALBUMIN HUMAN 25 % IV SOLN
INTRAVENOUS | Status: AC
  Administered 2016-02-19: 25 g via INTRAVENOUS
  Filled 2016-02-19: qty 100

## 2016-02-19 NOTE — Procedures (Signed)
PreOperative Dx: Cirrhosis, ascites Postoperative Dx: Cirrhosis, ascites Procedure:   US guided paracentesis Radiologist:  Allisson Schindel Anesthesia:  10 ml of1% lidocaine Specimen:  4.2 L of yellow ascitic fluid EBL:   < 1 ml Complications: None  

## 2016-02-19 NOTE — Progress Notes (Signed)
Paracentesis complete no signs of distress. 4200 ml yellow colored ascites removed.

## 2016-03-01 IMAGING — US US PARACENTESIS
1 series · 4 of 4 positions shown · non-contrast
Comparison: 07/17/2015

CLINICAL DATA: Cirrhosis, ascites

EXAM:
ULTRASOUND GUIDED DIAGNOSTIC AND THERAPEUTIC PARACENTESIS

[Series 1: us paracentesis · 0.14mm/px · 4 of 4 slices shown]
[im 1/4]
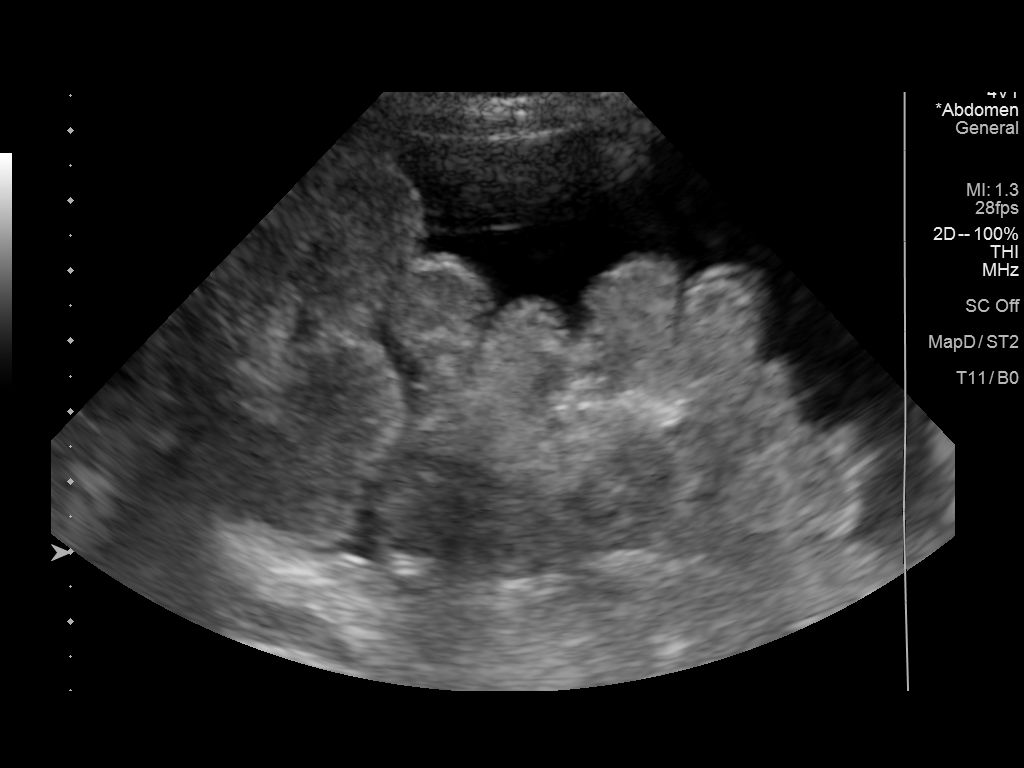
[im 2/4]
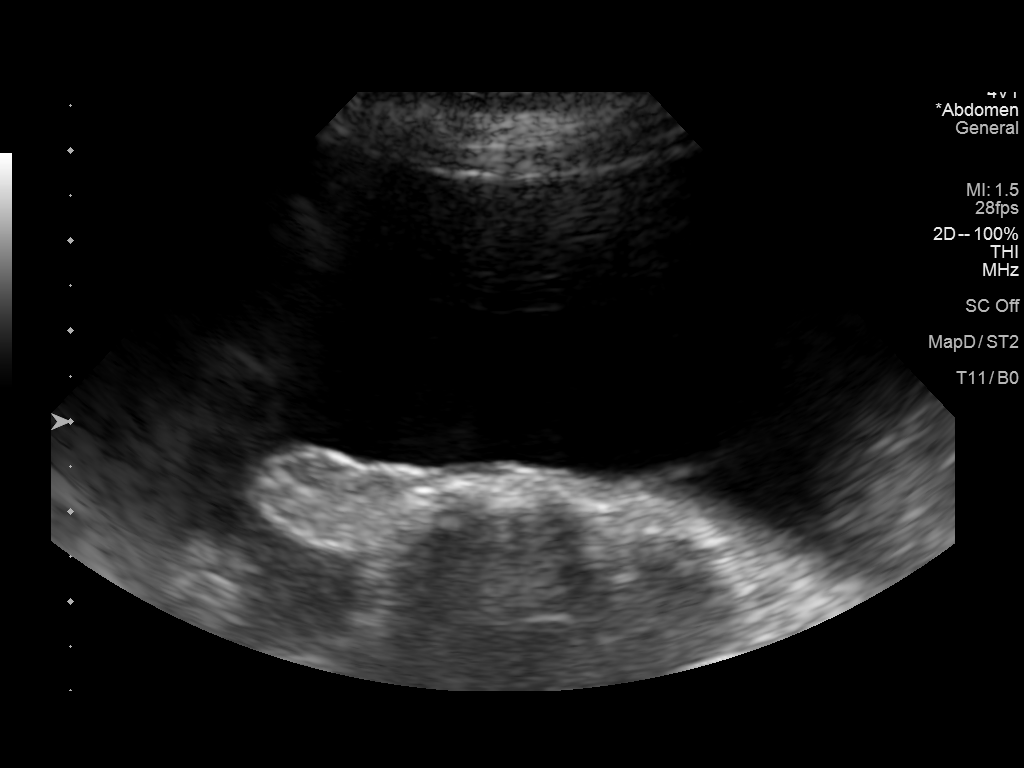
[im 3/4]
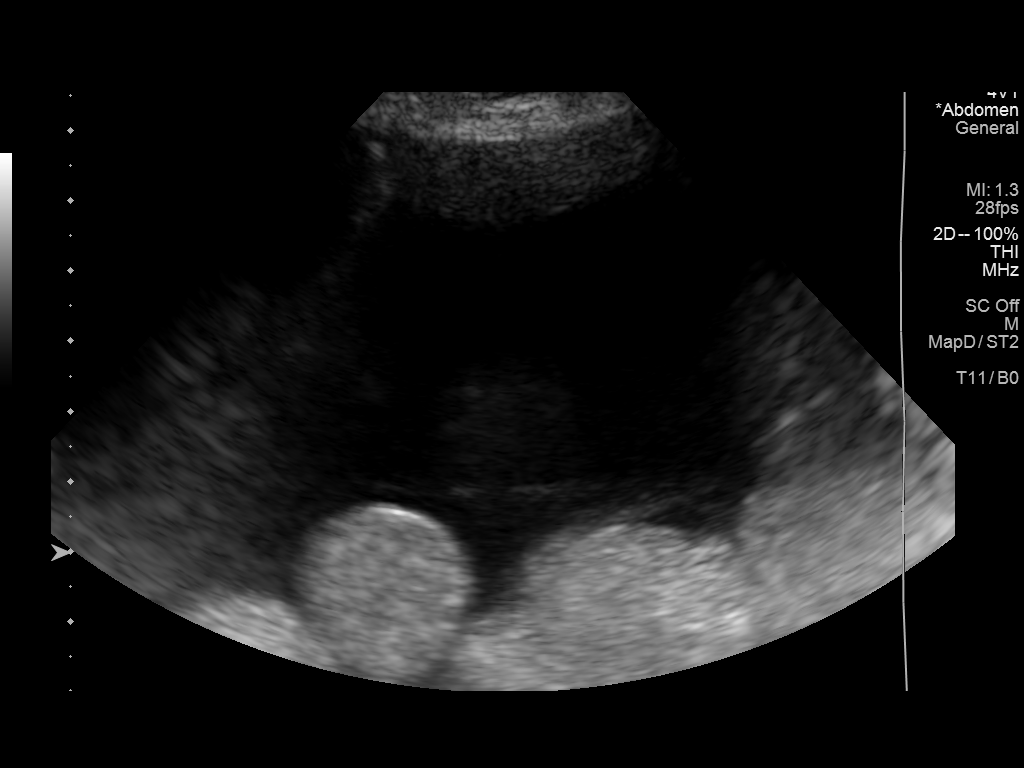
[im 4/4]
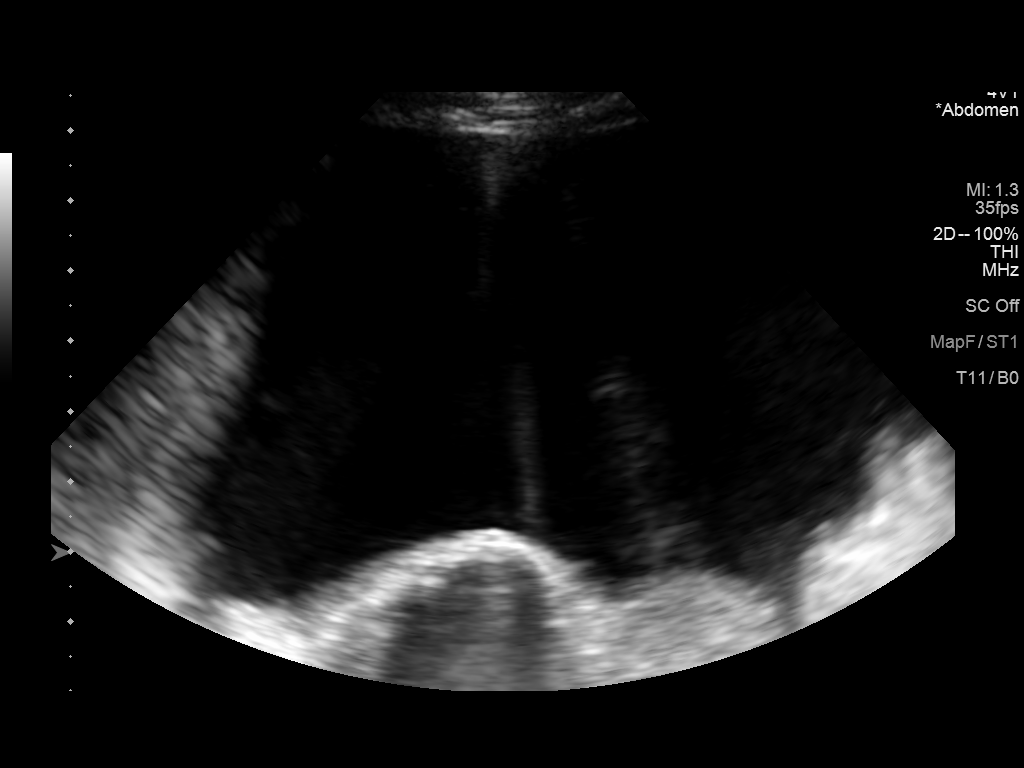

[4 of 4 positions shown; findings below may reference images not displayed]

PROCEDURE:
Procedure, benefits, and risks of procedure were discussed with
patient.

Written informed consent for procedure was obtained.

Time out protocol followed.

Adequate collection of ascites localized by ultrasound in LEFT lower
quadrant.

Skin prepped and draped in usual sterile fashion.

Skin and soft tissues anesthetized with 10 mL of 1% lidocaine.

5 French Yueh catheter placed into peritoneal cavity.

5955 mL of yellow ascitic fluid aspirated by vacuum bottle suction.

Procedure tolerated well by patient without immediate complication.

COMPLICATIONS:
None.
FINDINGS: A total of approximately 5955 mL of ascitic fluid was removed. A
fluid sample of 180 mL was sent for laboratory analysis.
IMPRESSION: Successful ultrasound guided paracentesis yielding 5955 mL of
ascites.

## 2016-03-03 ENCOUNTER — Other Ambulatory Visit: Payer: Self-pay

## 2016-03-03 DIAGNOSIS — I8500 Esophageal varices without bleeding: Secondary | ICD-10-CM

## 2016-03-04 ENCOUNTER — Ambulatory Visit (HOSPITAL_COMMUNITY)
Admission: RE | Admit: 2016-03-04 | Discharge: 2016-03-04 | Disposition: A | Discharge status: Home Health | Payer: Medicare Other | Source: Ambulatory Visit | Attending: Gastroenterology | Admitting: Gastroenterology

## 2016-03-04 ENCOUNTER — Other Ambulatory Visit (HOSPITAL_COMMUNITY): Payer: Self-pay

## 2016-03-04 ENCOUNTER — Ambulatory Visit (HOSPITAL_COMMUNITY)
Admission: RE | Admit: 2016-03-04 | Discharge: 2016-03-04 | Disposition: A | Discharge status: Home / Self Care | Payer: Medicare Other | Source: Ambulatory Visit | Attending: Gastroenterology | Admitting: Gastroenterology

## 2016-03-04 ENCOUNTER — Other Ambulatory Visit (HOSPITAL_COMMUNITY): Payer: Medicare Other

## 2016-03-04 ENCOUNTER — Encounter (HOSPITAL_COMMUNITY)
Admission: RE | Disposition: A | Discharge status: Home Health | Payer: Self-pay | Source: Ambulatory Visit | Attending: Gastroenterology

## 2016-03-04 ENCOUNTER — Encounter (HOSPITAL_COMMUNITY): Payer: Self-pay | Admitting: *Deleted

## 2016-03-04 DIAGNOSIS — F419 Anxiety disorder, unspecified: Secondary | ICD-10-CM | POA: Diagnosis not present

## 2016-03-04 DIAGNOSIS — I8500 Esophageal varices without bleeding: Secondary | ICD-10-CM | POA: Diagnosis not present

## 2016-03-04 DIAGNOSIS — Z87891 Personal history of nicotine dependence: Secondary | ICD-10-CM | POA: Insufficient documentation

## 2016-03-04 DIAGNOSIS — K219 Gastro-esophageal reflux disease without esophagitis: Secondary | ICD-10-CM | POA: Diagnosis not present

## 2016-03-04 DIAGNOSIS — K7460 Unspecified cirrhosis of liver: Secondary | ICD-10-CM | POA: Diagnosis present

## 2016-03-04 DIAGNOSIS — R188 Other ascites: Secondary | ICD-10-CM | POA: Diagnosis not present

## 2016-03-04 DIAGNOSIS — K3189 Other diseases of stomach and duodenum: Secondary | ICD-10-CM | POA: Diagnosis not present

## 2016-03-04 DIAGNOSIS — N186 End stage renal disease: Secondary | ICD-10-CM | POA: Insufficient documentation

## 2016-03-04 DIAGNOSIS — K766 Portal hypertension: Secondary | ICD-10-CM | POA: Insufficient documentation

## 2016-03-04 DIAGNOSIS — Z79899 Other long term (current) drug therapy: Secondary | ICD-10-CM | POA: Diagnosis not present

## 2016-03-04 DIAGNOSIS — Z992 Dependence on renal dialysis: Secondary | ICD-10-CM | POA: Diagnosis not present

## 2016-03-04 DIAGNOSIS — I8510 Secondary esophageal varices without bleeding: Secondary | ICD-10-CM | POA: Insufficient documentation

## 2016-03-04 HISTORY — PX: ESOPHAGOGASTRODUODENOSCOPY: SHX5428

## 2016-03-04 HISTORY — PX: ESOPHAGEAL BANDING: SHX5518

## 2016-03-04 SURGERY — EGD (ESOPHAGOGASTRODUODENOSCOPY)
Anesthesia: Moderate Sedation

## 2016-03-04 MED ORDER — FENTANYL CITRATE (PF) 100 MCG/2ML IJ SOLN
INTRAMUSCULAR | Status: DC | PRN
  Administered 2016-03-04: 25 ug via INTRAVENOUS
  Administered 2016-03-04: 50 ug via INTRAVENOUS

## 2016-03-04 MED ORDER — LIDOCAINE VISCOUS 2 % MT SOLN
OROMUCOSAL | Status: AC
  Filled 2016-03-04: qty 15

## 2016-03-04 MED ORDER — SIMETHICONE 40 MG/0.6ML PO SUSP
ORAL | Status: DC | PRN
  Administered 2016-03-04: 13:00:00

## 2016-03-04 MED ORDER — MIDAZOLAM HCL 5 MG/5ML IJ SOLN
INTRAMUSCULAR | Status: AC
  Filled 2016-03-04: qty 10

## 2016-03-04 MED ORDER — PROMETHAZINE HCL 25 MG/ML IJ SOLN
INTRAMUSCULAR | Status: AC
  Filled 2016-03-04: qty 1

## 2016-03-04 MED ORDER — SODIUM CHLORIDE 0.9 % IV SOLN
INTRAVENOUS | Status: DC
  Administered 2016-03-04: 1000 mL via INTRAVENOUS

## 2016-03-04 MED ORDER — PROMETHAZINE HCL 25 MG/ML IJ SOLN
INTRAMUSCULAR | Status: DC | PRN
  Administered 2016-03-04: 12.5 mg via INTRAVENOUS

## 2016-03-04 MED ORDER — SODIUM CHLORIDE 0.9% FLUSH
INTRAVENOUS | Status: AC
  Filled 2016-03-04: qty 10

## 2016-03-04 MED ORDER — FENTANYL CITRATE (PF) 100 MCG/2ML IJ SOLN
INTRAMUSCULAR | Status: AC
  Filled 2016-03-04: qty 2

## 2016-03-04 MED ORDER — MIDAZOLAM HCL 5 MG/5ML IJ SOLN
INTRAMUSCULAR | Status: DC | PRN
  Administered 2016-03-04 (×4): 2 mg via INTRAVENOUS

## 2016-03-04 MED ORDER — LIDOCAINE VISCOUS 2 % MT SOLN
OROMUCOSAL | Status: DC | PRN
Start: 2016-03-04 — End: 2016-03-04
  Administered 2016-03-04: 1 via OROMUCOSAL

## 2016-03-04 MED ORDER — MEPERIDINE HCL 100 MG/ML IJ SOLN
INTRAMUSCULAR | Status: AC
  Filled 2016-03-04: qty 2

## 2016-03-04 NOTE — H&P (Signed)
Primary Care Physician:  [name], MD Primary Gastroenterologist:  Dr. Oneida Alar  Pre-Procedure History & Physical: HPI:  Wanda [name] is a 40 y.o. female here for SCREENING FOR VARICES.  Past Medical History:  Diagnosis Date  . Acute blood loss anemia 02/25/2014   Status post transfusion  . Acute renal failure ([place]) 09/2013   Pre-renal- resolved  . Anasarca 10/10/2013  . Anxiety   . Bipolar disorder ([place]) 12/04/2013   2007-SEEN IN ED FOR INVOLUNTARY COMMITMENT, UDS POS FOR AMPHETAMINES/OPIATES   . Bleeding esophageal varices ([place]) 02/28/2014   s/p banding  . C. difficile colitis 04/19/2014  . Chronic hypotension   . Cirrhosis ([place]) 10/05/13   Liver bx 11/23/13 (delayed initially due to patient refusal). c/w steatohepatitis  . Cirrhosis of liver with ascites ([place])   . Depression   . ESRD (end stage renal disease) on dialysis ([place]) 08/2014  . Folate deficiency 09/2013  . Gastroesophageal junction ulcer 09/17/2014  . GERD (gastroesophageal reflux disease)   . Hematemesis/vomiting blood 02/24/2014  . Macrocytosis 02/28/2014  . PNA (pneumonia) 10/13/2013  . SBP (spontaneous bacterial peritonitis) ([place]) 11/10/2013  . Thrombocytopenia ([place])    Hypercellular bone marrow; abundant megakaryocytes per 08/27/2014; s/p bone marrow bx    Past Surgical History:  Procedure Laterality Date  . AV FISTULA PLACEMENT Right 11/16/2014   Procedure: Right arm Creation of arteriovenous fistula;  Surgeon: Angelia Mould, MD;  Location: [facility];  Service: Vascular;  Laterality: Right;  . CENTRAL VENOUS CATHETER INSERTION Right   . COLONOSCOPY N/A 12/19/2013   SLF:NO OBVIOUS SOURCE FOR ANEMIA IDETIFIED/ONE COLON POLYP REMOVED/Small internal hemorrhoids  . ESOPHAGEAL BANDING  07/04/2014   Procedure: ESOPHAGEAL BANDING;  Surgeon: Daneil Dolin, MD;  [facility];  Service: Endoscopy;;  . ESOPHAGEAL BANDING N/A 07/24/2014   Procedure: ESOPHAGEAL BANDING (2 bands applied);  Surgeon: Danie Binder, MD;  Location: [facility];  Service: Endoscopy;  Laterality: N/A;  . ESOPHAGEAL BANDING N/A 07/23/2015   Procedure: ESOPHAGEAL BANDING;  Surgeon: Danie Binder, MD;  [facility];  Service: Endoscopy;  Laterality: N/A;  . ESOPHAGOGASTRODUODENOSCOPY N/A 11/14/2013   SLF:1 column of very small varices in distal esopahgus/MODERATE PORTAL GASTROPATHY IN PROXIMAL STOMACH/MODERATE erosive gastritis  . ESOPHAGOGASTRODUODENOSCOPY N/A 02/11/2014   Dr. Rourk:Esophageal varices with bleeding stigmata-status post esophageal band ligation therapy. Portal gastropathy  . ESOPHAGOGASTRODUODENOSCOPY N/A 07/04/2014   RMR: Persiting grade 2 esophageal varicies with bleeding stigmata status post band ligation. Significantly congested gastric mucosa iwith changes constistant with protal gastropathy.   . ESOPHAGOGASTRODUODENOSCOPY N/A 09/16/2014   Rehman: Single short column of varix proximal to GE junction not large enough to be banded. Two amall ulcers at the GEJ felt to be source of GI Bleeding but no active bleeding but no actibe bleeding noted. No therapy rendered. Portal gastropathy NO evidence of peptic ulcer diease or gastric varices.   . ESOPHAGOGASTRODUODENOSCOPY N/A 12/14/2014   SLF: Grade ! esophageal varices. 2. Moderate Portal Gastropathy  . ESOPHAGOGASTRODUODENOSCOPY N/A 03/27/2015   Procedure: ESOPHAGOGASTRODUODENOSCOPY (EGD);  Surgeon: Danie Binder, MD;  [facility];  Service: Endoscopy;  Laterality: N/A;  1045am - moved to 817 @ 11:30  . ESOPHAGOGASTRODUODENOSCOPY N/A 06/05/2015   Procedure: ESOPHAGOGASTRODUODENOSCOPY (EGD);  Surgeon: Clarene Essex, MD;  Location[facility];  Service: Endoscopy;  Laterality: N/A;  . ESOPHAGOGASTRODUODENOSCOPY N/A 07/23/2015   SLF:1. Grade 1 esophageal varices 2. Moderate portal hypertensive gastropathy 3. MILd non-erosive gastritis.   [name] ESOPHAGOGASTRODUODENOSCOPY (EGD) WITH  PROPOFOL N/A 07/24/2014   SLF:  1. 2 columns grade 2-3 varices- 2 Bands  applied.  2.  Moderate gastropathy 3. Duodenal Diverticula  . ESOPHAGOGASTRODUODENOSCOPY (EGD) WITH PROPOFOL N/A 10/26/2014   RMR: 2 columns of grade 2 esophageal varices without obvious bleeding stigmata status post band ligation to complet obliteration of remaining varices.   . None    . PARACENTESIS  Feb 2015   1180 fluid, negative fluid analysis.   [name] PARACENTESIS  10/2013    Prior to Admission medications   Medication Sig Start Date End Date Taking? Authorizing Provider  clotrimazole (LOTRIMIN) 1 % cream TOP TID TO SKIN FOR 10 DAYS 01/24/16  Yes Danie Binder, MD  Darbepoetin Alfa (ARANESP) 60 MCG/0.3ML SOSY injection Inject 0.3 mLs (60 mcg total) into the vein every Thursday with hemodialysis. 06/09/15  Yes Cherene Altes, MD  lactulose ([place]) 10 GM/15ML solution TAKE 3 TEASPOONSFUL (15ML) BY MOUTH TWICE DAILY Patient taking differently: Michela Pitcher she takes about 2 tsps daily 07/22/15  Yes Mahala Menghini, PA-C  lidocaine (XYLOCAINE) 2 % solution TAKE TWO TEASPOONSFUL (10ML) BY MOUTH BEFORE MEALS AND AT BEDTIME TO PREVENT CHEST PAIN WHILE EATING Patient taking differently: Take 10 mLs by mouth 3 (three) times daily as needed (TAKE TWO TEASPOONSFUL (10ML) BY MOUTH BEFORE MEALS AND AT BEDTIME TO PREVENT CHEST PAIN WHILE EATING). TAKE TWO TEASPOONSFUL (10ML) BY MOUTH BEFORE MEALS AND AT BEDTIME TO PREVENT CHEST PAIN WHILE EATING 08/19/15  Yes Carlis Stable, NP  LORazepam (ATIVAN) 1 MG tablet TAKE 1/2 TO 1 TABLET BY MOUTH TWICE DAILY AS NEEDED FOR BACK SPASM OR FOR ANXIETY 01/29/16  Yes Danie Binder, MD  midodrine (PROAMATINE) 10 MG tablet Take 1 tablet (10 mg total) by mouth daily. *Takes only on dialysis days on Tuesdays, Thursdays, and Saturdays (Sometimes takes treatments on Friday instead of Saturday)**Medication to keep your blood pressure from falling. 06/09/15  Yes Cherene Altes, MD  Oxycodone HCl 10 MG TABS 1 PO TID AS NEEDED FOR PAIN 02/05/16  Yes Danie Binder, MD  pantoprazole  (PROTONIX) 40 MG tablet Take 1 tablet (40 mg total) by mouth 2 (two) times daily. Patient taking differently: Take 40 mg by mouth 2 (two) times daily as needed (acid reflux).  09/19/14   Kathie Dike, MD  promethazine (PHENERGAN) 12.5 MG tablet TAKE ONE TABLET BY MOUTH EVERY 6 HOURS AS NEEDED FOR NAUSEA AND VOMITING. 09/06/15   Mahala Menghini, PA-C  RENVELA 800 MG tablet 1 TABLET 3 TIMES A DAY WITH MEALS 10/29/15   Historical Provider, MD  sucralfate (CARAFATE) 1 GM/10ML suspension Take 10 mLs (1 g total) by mouth 4 (four) times daily. 07/10/15   Danie Binder, MD    Allergies as of 03/03/2016 - Review Complete 02/19/2016  Allergen Reaction Noted  . Lasix [furosemide]  04/12/2014  . Latex Itching 05/14/2015  . Morphine and related Hives and Itching 09/16/2014    Family History  Problem Relation Age of Onset  . Heart disease Mother   . Colon cancer Neg Hx   . Liver disease Neg Hx     Social History   Social History  . Marital status: Single    Spouse name: N/A  . Number of children: 1  . Years of education: N/A   Occupational History  . unemployed    Social History Main Topics  . Smoking status: Former Smoker    Packs/day: 0.25    Years: 20.00    Types: Cigarettes  Quit date: 11/05/2008  . Smokeless tobacco: Never Used     Comment: Never really smoked much  . Alcohol use No  . Drug use: No  . Sexual activity: Not Currently    Partners: Male    Birth control/ protection: None   Other Topics Concern  . Not on file   Social History Narrative  . No narrative on file    Review of Systems: See HPI, otherwise negative ROS   Physical Exam: BP (!) 99/57   Pulse 96   Temp 97.8 F (36.6 C) (Axillary)   Resp 19   Ht 5\' 1"  (1.549 m)   Wt 160 lb (72.6 kg)   LMP 09/19/2013   SpO2 100%   BMI 30.23 kg/m  General:   Alert,  pleasant and cooperative in NAD Head:  Normocephalic and atraumatic. Neck:  Supple; Lungs:  Clear throughout to auscultation.    Heart:   Regular rate and rhythm. Abdomen:  Soft, nontender and nondistended. Normal bowel sounds, without guarding, and without rebound.   Neurologic:  Alert and  oriented x4;  grossly normal neurologically.  Impression/Plan:    SCREENING VARICES  PLAN:  1.EGD TODAY

## 2016-03-04 NOTE — Discharge Instructions (Signed)
You have esophageal varices DUE TO SCARRING IN YOUR LIVER.     CONTINUE PROTONIX.  FOLLOW UP IN DEC 2017  REPEAT EGD IN DEC 2017.     UPPER ENDOSCOPY AFTER CARE Read the instructions outlined below and refer to this sheet in the next week. These discharge instructions provide you with general information on caring for yourself after you leave the hospital. While your treatment has been planned according to the most current medical practices available, unavoidable complications occasionally occur. If you have any problems or questions after discharge, call DR. FIELDS, [phone].  ACTIVITY  You may resume your regular activity, but move at a slower pace for the next 24 hours.   Take frequent rest periods for the next 24 hours.   Walking will help get rid of the air and reduce the bloated feeling in your belly (abdomen).   No driving for 24 hours (because of the medicine (anesthesia) used during the test).   You may shower.   Do not sign any important legal documents or operate any machinery for 24 hours (because of the anesthesia used during the test).    NUTRITION  Drink plenty of fluids.   You may resume your normal diet as instructed by your doctor.   Begin with a light meal and progress to your normal diet. Heavy or fried foods are harder to digest and may make you feel sick to your stomach (nauseated).   Avoid alcoholic beverages for 24 hours or as instructed.    MEDICATIONS  You may resume your normal medications.   WHAT YOU CAN EXPECT TODAY  Some feelings of bloating in the abdomen.   Passage of more gas than usual.    IF YOU HAD A BIOPSY TAKEN DURING THE UPPER ENDOSCOPY:  Eat a soft diet IF YOU HAVE NAUSEA, BLOATING, ABDOMINAL PAIN, OR VOMITING.    FINDING OUT THE RESULTS OF YOUR TEST Not all test results are available during your visit. DR. Oneida Alar WILL CALL YOU WITHIN 7 DAYS OF YOUR PROCEDUE WITH YOUR RESULTS. Do not assume everything is normal if  you have not heard from DR. FIELDS IN ONE WEEK, CALL HER OFFICE AT [phone].  SEEK IMMEDIATE MEDICAL ATTENTION AND CALL THE OFFICE: [phone] IF:  You have more than a spotting of blood in your stool.   Your belly is swollen (abdominal distention).   You are nauseated or vomiting.   You have a temperature over 101F.   You have abdominal pain or discomfort that is severe or gets worse throughout the day.

## 2016-03-04 NOTE — Op Note (Signed)
[facility] Patient Name: Wanda [name] Procedure Date: 03/04/2016 12:39 PM MRN: [id number] Date of Birth: 21-Apr-1976 Attending MD: Barney Drain , MD CSN: [id number] Age: 40 Admit Type: Outpatient Procedure:                Upper GI endoscopy, SCREENING Indications:              Follow-up of esophageal varices. LAST EGD DEC 2016:                            GRADE 1 VARICES Providers:                Barney Drain, MD, Renda Rolls, RN, Isabella Stalling,                            Technician Referring MD:             [name] MD, MD Medicines:                Fentanyl 75 micrograms IV, Midazolam 8 mg IV,                            Promethazine [id number] mg IV Complications:            No immediate complications. Estimated Blood Loss:     Estimated blood loss: none. Procedure:                Pre-Anesthesia Assessment:                           - Prior to the procedure, a History and Physical                            was performed, and patient medications and                            allergies were reviewed. The patient's tolerance of                            previous anesthesia was also reviewed. The risks                            and benefits of the procedure and the sedation                            options and risks were discussed with the patient.                            All questions were answered, and informed consent                            was obtained. Prior Anticoagulants: The patient has                            taken no previous anticoagulant or antiplatelet  agents. ASA Grade Assessment: II - A patient with                            mild systemic disease. After reviewing the risks                            and benefits, the patient was deemed in                            satisfactory condition to undergo the procedure.                            After obtaining informed consent, the endoscope was   passed under direct vision. Throughout the                            procedure, the patient's blood pressure, pulse, and                            oxygen saturations were monitored continuously. The                            [id number]) scope was introduced through the                            mouth, and advanced to the second part of duodenum.                            The upper GI endoscopy was accomplished without                            difficulty. The patient tolerated the procedure                            well. Scope In: 1:11:12 PM Scope Out: 1:14:24 PM Total Procedure Duration: 0 hours 3 minutes 12 seconds  Findings:      Grade I varices were found in the lower third of the esophagus.      Moderate portal hypertensive gastropathy was found in the entire       examined stomach.      The examined duodenum was normal. Impression:               - Grade I esophageal varices.                           - Portal hypertensive gastropathy.                           - Normal examined duodenum.                           - NO GASTRIC OR DUODENAL VARICES Moderate Sedation:      Moderate (conscious) sedation was administered by the endoscopy nurse       and supervised by the endoscopist. The following parameters were  monitored: oxygen saturation, heart rate, blood pressure, and response       to care. Total physician intraservice time was 24 minutes. Recommendation:           - Resume previous diet.                           - Continue present medications.                           - Repeat upper endoscopy in 6 months for                            surveillance.                           - Return to my office in 6 months.                           - Patient has a contact number available for                            emergencies. The signs and symptoms of potential                            delayed complications were discussed with the                             patient. Return to normal activities tomorrow.                            Written discharge instructions were provided to the                            patient. Procedure Code(s):        --- Professional ---                           [phone], Esophagogastroduodenoscopy, flexible,                            transoral; diagnostic, including collection of                            specimen(s) by brushing or washing, when performed                            (separate procedure)                           99152, Moderate sedation services provided by the                            same physician or other qualified health care                            professional performing the diagnostic or  therapeutic service that the sedation supports,                            requiring the presence of an independent trained                            observer to assist in the monitoring of the                            patient's level of consciousness and physiological                            status; initial 15 minutes of intraservice time,                            patient age 70 years or older                           [phone], Moderate sedation services; each additional                            15 minutes intraservice time Diagnosis Code(s):        --- Professional ---                           I85.00, Esophageal varices without bleeding                           K76.6, Portal hypertension                           K31.89, Other diseases of stomach and duodenum CPT copyright 2016 [employer]. All rights reserved. The codes documented in this report are preliminary and upon coder review may  be revised to meet current compliance requirements. Barney Drain, MD Barney Drain, MD 03/04/2016 1:52:15 PM This report has been signed electronically. Number of Addenda: 0

## 2016-03-05 ENCOUNTER — Other Ambulatory Visit (HOSPITAL_COMMUNITY): Payer: Medicare Other

## 2016-03-06 ENCOUNTER — Encounter (HOSPITAL_COMMUNITY): Payer: Self-pay

## 2016-03-06 ENCOUNTER — Ambulatory Visit (HOSPITAL_COMMUNITY)
Admission: RE | Admit: 2016-03-06 | Discharge: 2016-03-06 | Disposition: A | Discharge status: Home / Self Care | Payer: Medicare Other | Source: Ambulatory Visit | Attending: Gastroenterology | Admitting: Gastroenterology

## 2016-03-06 DIAGNOSIS — K7460 Unspecified cirrhosis of liver: Secondary | ICD-10-CM | POA: Diagnosis not present

## 2016-03-06 MED ORDER — ALBUMIN HUMAN 25 % IV SOLN
INTRAVENOUS | Status: AC
  Administered 2016-03-06: 25 g via INTRAVENOUS
  Filled 2016-03-06: qty 100

## 2016-03-06 MED ORDER — ALBUMIN HUMAN 25 % IV SOLN
25.0000 g | Freq: Once | INTRAVENOUS | Status: AC
  Administered 2016-03-06: 25 g via INTRAVENOUS

## 2016-03-06 NOTE — Progress Notes (Signed)
Paracentesis compete no signs of distress. 4000 ml ascites removed.

## 2016-03-09 ENCOUNTER — Other Ambulatory Visit: Payer: Self-pay | Admitting: Gastroenterology

## 2016-03-10 ENCOUNTER — Encounter (HOSPITAL_COMMUNITY): Payer: Self-pay | Admitting: Gastroenterology

## 2016-03-12 ENCOUNTER — Other Ambulatory Visit: Payer: Self-pay | Admitting: Gastroenterology

## 2016-03-12 ENCOUNTER — Telehealth: Payer: Self-pay | Admitting: General Practice

## 2016-03-12 DIAGNOSIS — R188 Other ascites: Secondary | ICD-10-CM

## 2016-03-12 NOTE — Telephone Encounter (Signed)
Patient called to get the number to Radiology so she can reschedule her Para from next week, 8/9 to this afternoon.  She stated she will call us back if she had a problem

## 2016-03-13 ENCOUNTER — Ambulatory Visit (HOSPITAL_COMMUNITY): Payer: Medicare Other

## 2016-03-17 IMAGING — US US PARACENTESIS
1 series · 2 of 2 positions shown · non-contrast
Comparison: 07/31/2015

CLINICAL DATA: Symptomatic ascites.  Cirrhosis from NASH

EXAM:
ULTRASOUND GUIDED  PARACENTESIS

[Series 1: us paracentesis · 0.22mm/px · 2 of 2 slices shown]
[im 1/2]
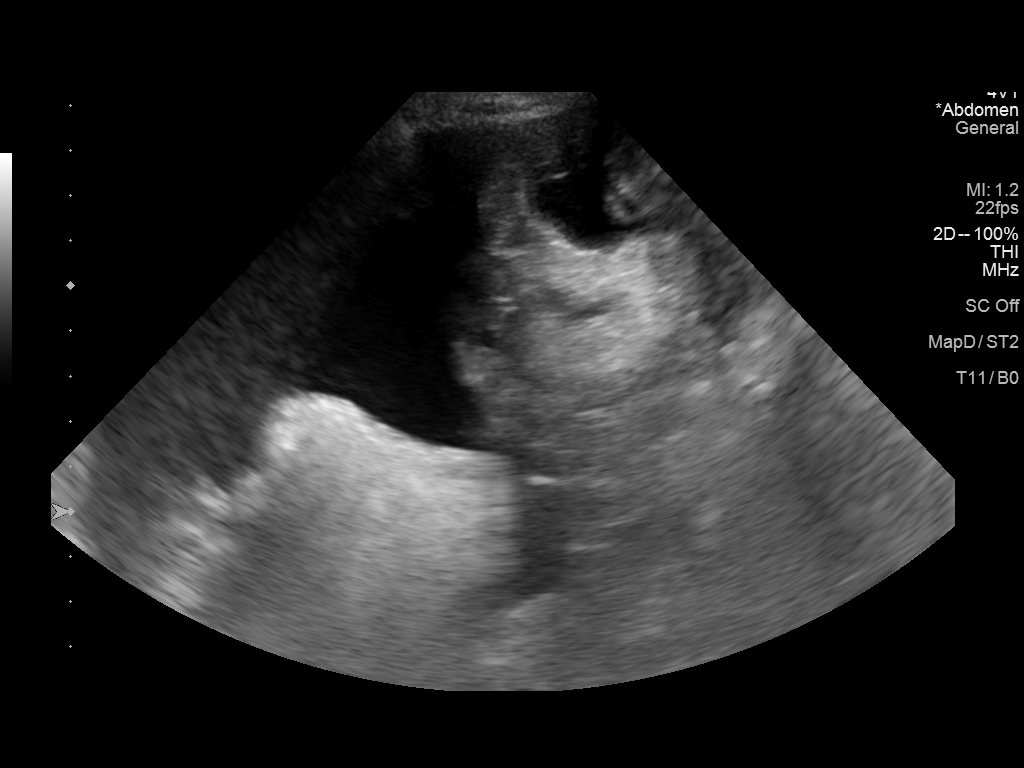
[im 2/2]
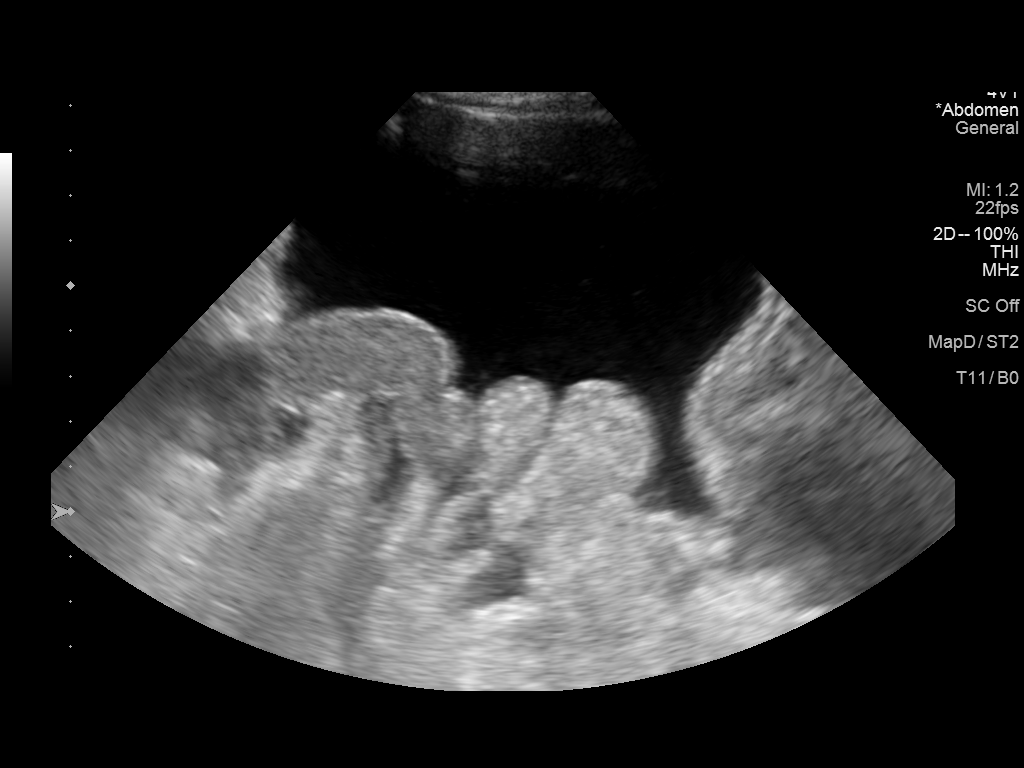

[2 of 2 positions shown; findings below may reference images not displayed]

PROCEDURE:
An ultrasound guided paracentesis was discussed with the patient and
questions answered. The patient understands the procedure and wishes
to proceed with the procedure. Written consent was obtained.

Ultrasound was performed to localize and mark an adequate pocket of
fluid in the left lower quadrant of the abdomen. The area was then
prepped and draped in the normal sterile fashion. 1% Lidocaine was
used for local anesthesia. Under ultrasound guidance a 19 gauge Yueh
catheter was introduced. Paracentesis was performed. The catheter
was removed and a dressing applied.

COMPLICATIONS:
None.
FINDINGS: A total of approximately 4.8 L of straw colored ascitic fluid was
removed. A fluid sample was reserved for laboratory analysis if
requested.
IMPRESSION: Successful ultrasound guided paracentesis yielding 4.8 L of ascites.

## 2016-03-18 ENCOUNTER — Ambulatory Visit (HOSPITAL_COMMUNITY)
Admission: RE | Admit: 2016-03-18 | Discharge: 2016-03-18 | Disposition: A | Discharge status: Home / Self Care | Payer: Medicare Other | Source: Ambulatory Visit | Attending: Gastroenterology | Admitting: Gastroenterology

## 2016-03-18 ENCOUNTER — Ambulatory Visit (HOSPITAL_COMMUNITY): Payer: Medicare Other

## 2016-03-18 ENCOUNTER — Other Ambulatory Visit: Payer: Self-pay | Admitting: Nurse Practitioner

## 2016-03-18 ENCOUNTER — Other Ambulatory Visit (HOSPITAL_COMMUNITY): Payer: Self-pay

## 2016-03-18 ENCOUNTER — Other Ambulatory Visit: Payer: Self-pay | Admitting: Gastroenterology

## 2016-03-18 DIAGNOSIS — K7460 Unspecified cirrhosis of liver: Secondary | ICD-10-CM | POA: Insufficient documentation

## 2016-03-18 DIAGNOSIS — K7290 Hepatic failure, unspecified without coma: Secondary | ICD-10-CM | POA: Insufficient documentation

## 2016-03-18 DIAGNOSIS — R188 Other ascites: Secondary | ICD-10-CM | POA: Insufficient documentation

## 2016-03-18 MED ORDER — ALBUMIN HUMAN 25 % IV SOLN
INTRAVENOUS | Status: AC
  Administered 2016-03-18: 25 g via INTRAVENOUS
  Filled 2016-03-18: qty 100

## 2016-03-18 MED ORDER — ALBUMIN HUMAN 25 % IV SOLN
25.0000 g | Freq: Once | INTRAVENOUS | Status: AC
  Administered 2016-03-18: 25 g via INTRAVENOUS

## 2016-03-18 NOTE — Progress Notes (Signed)
Paracentesis complete no signs of distress. 3000 ml amber colored ascites removed.  

## 2016-03-18 NOTE — Procedures (Signed)
PreOperative Dx: Cirrhosis, ascites Postoperative Dx: Cirrhosis, ascites Procedure:   US guided paracentesis Radiologist:  Thornton Papas Anesthesia:  10 ml of1% lidocaine Specimen:  3 L of amber ascitic fluid EBL:   < 1 ml Complications: None

## 2016-03-21 IMAGING — CT CT ABD-PELV W/ CM
2 of 5 series · 16 of 46 positions shown, 18 images · IV contrast (Omnipaque 300)
Comparison: Abdominal ultrasound dated 08/16/2015

CLINICAL DATA: 39-year-old female with lower abdominal pain.

EXAM:
CT ABDOMEN AND PELVIS WITH CONTRAST
TECHNIQUE: Multidetector CT imaging of the abdomen and pelvis was performed
using the standard protocol following bolus administration of
intravenous contrast.
CONTRAST:  100mL OMNIPAQUE IOHEXOL 300 MG/ML  SOLN

[Series 2: abd_pel_with 5.0 b40f · axial · 0.70mm/px · z∈[-360,+14]mm · 13 of 86 slices shown, 15 images]
[im 6/86  soft-tissue]
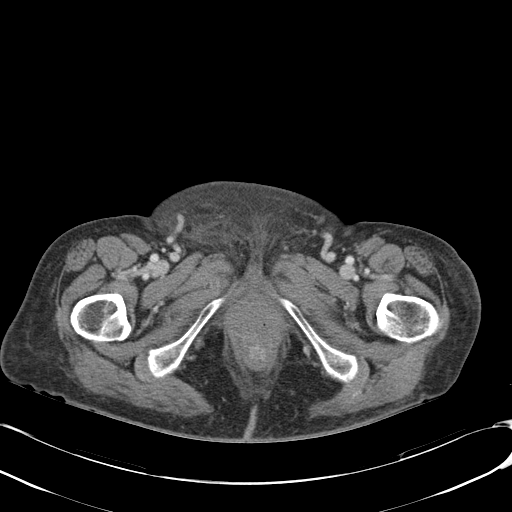
[im 6/86  bone]
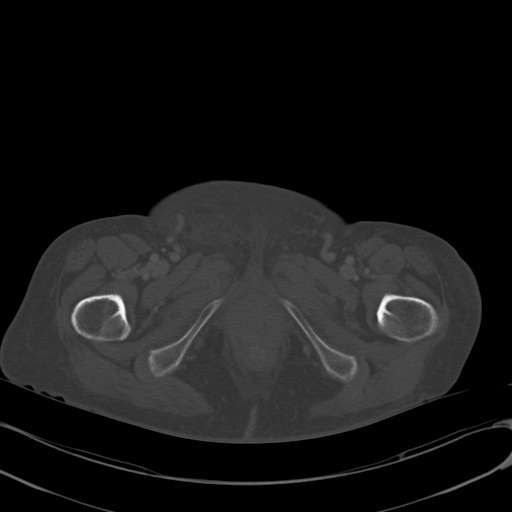
[im 11/86  soft-tissue]
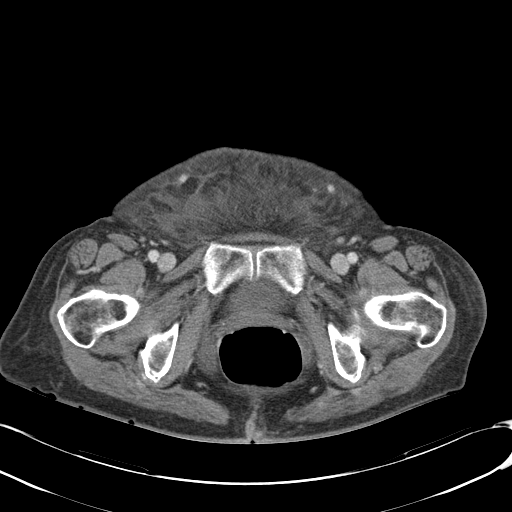
[im 21/86  soft-tissue]
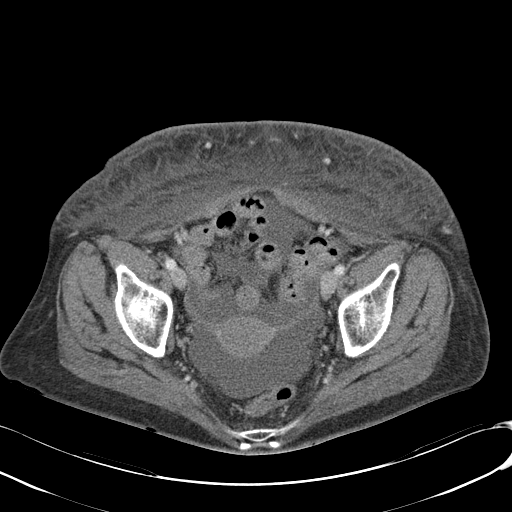
[im 26/86  soft-tissue]
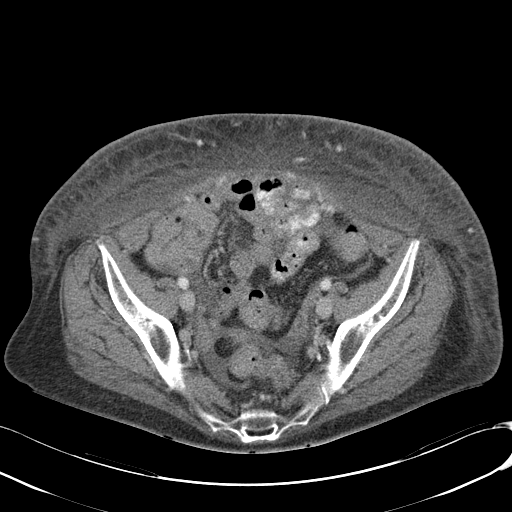
[im 31/86  soft-tissue]
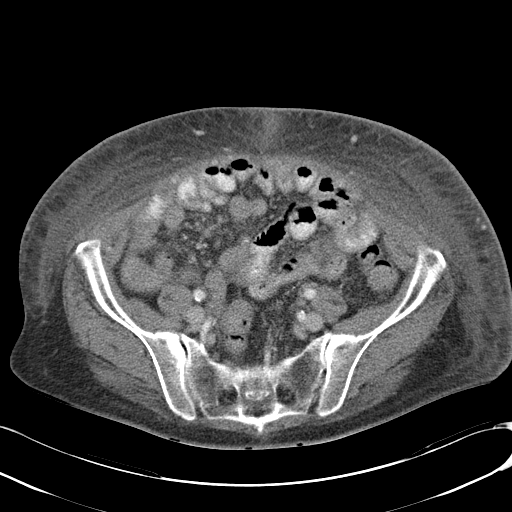
[im 36/86  soft-tissue]
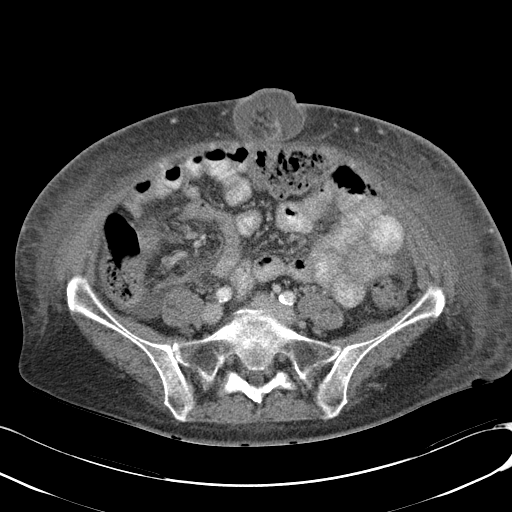
[im 46/86  soft-tissue]
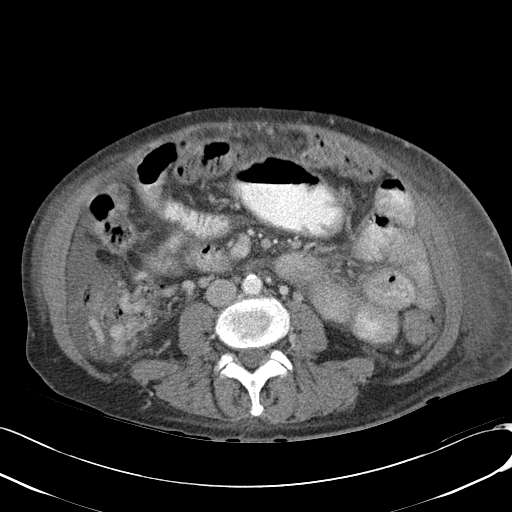
[im 51/86  soft-tissue]
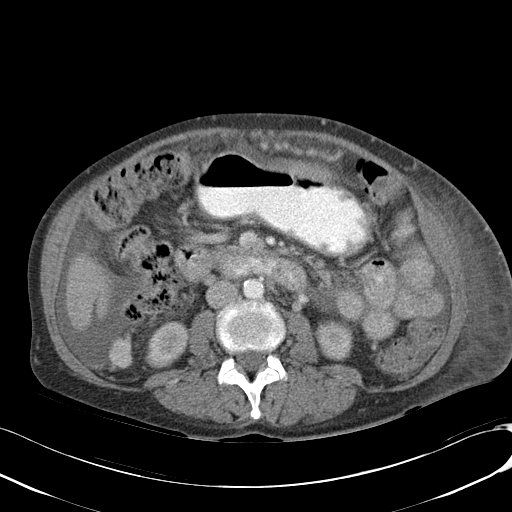
[im 56/86  soft-tissue]
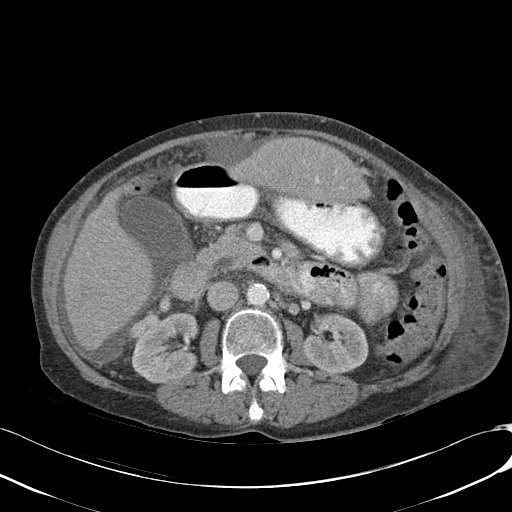
[im 56/86  bone]
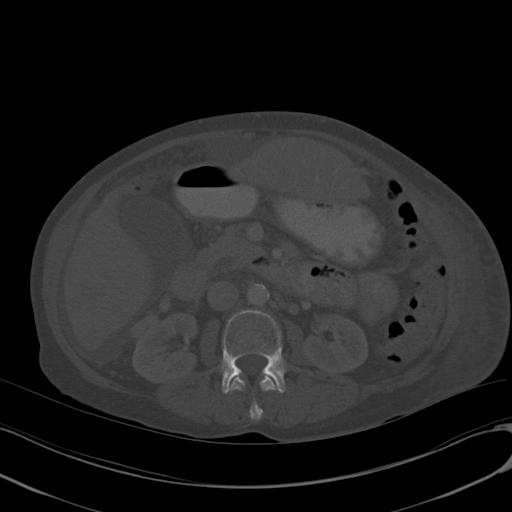
[im 61/86  soft-tissue]
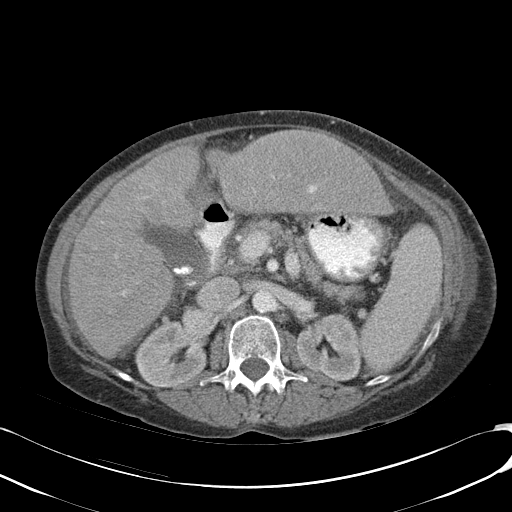
[im 66/86  soft-tissue]
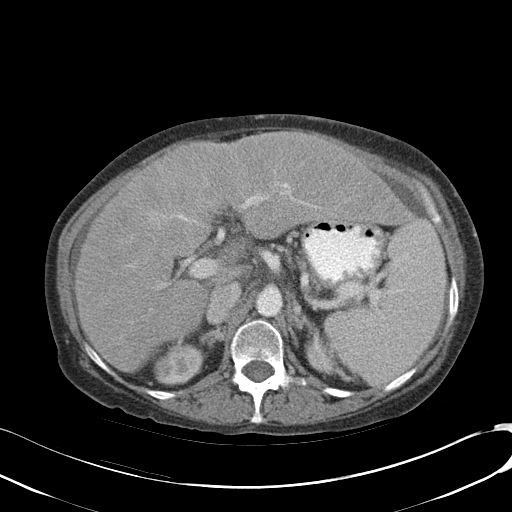
[im 76/86  soft-tissue]
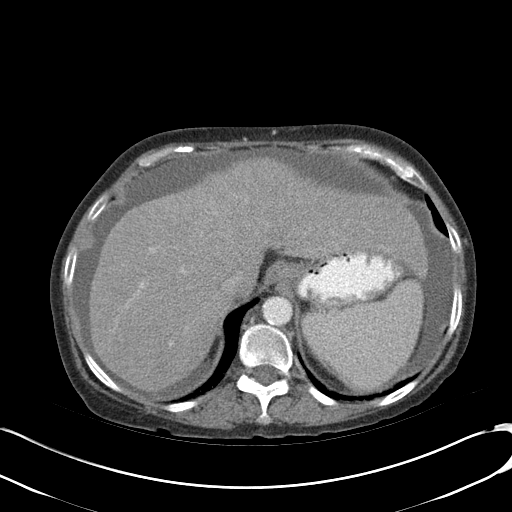
[im 81/86  soft-tissue]
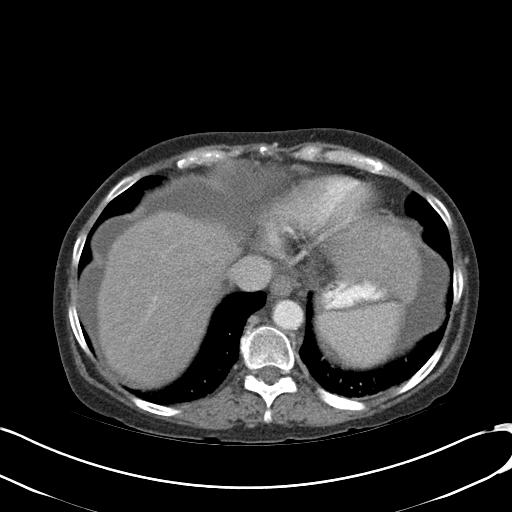

[Series 3: abd_pel_with 3.0 spo cor · coronal · 0.76mm/px · 3 of 82 slices shown]
[im 28/82  soft-tissue]
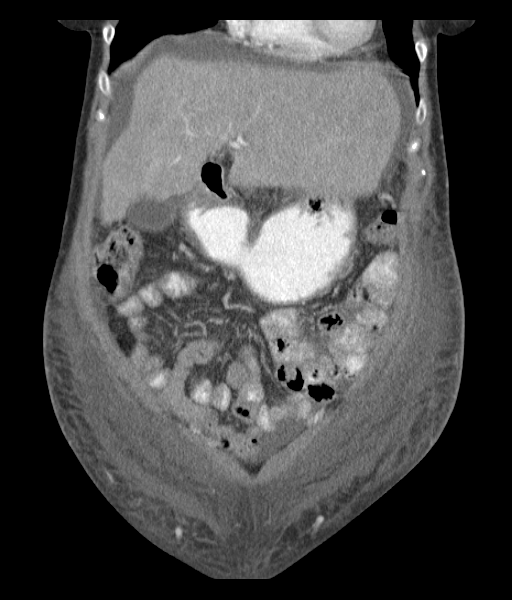
[im 37/82  soft-tissue]
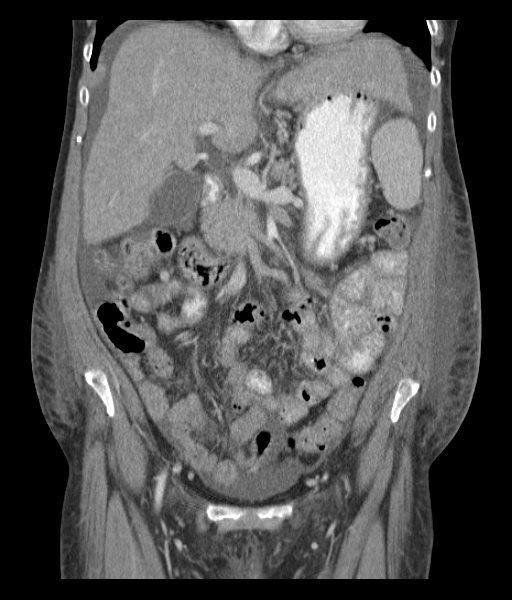
[im 46/82  soft-tissue]
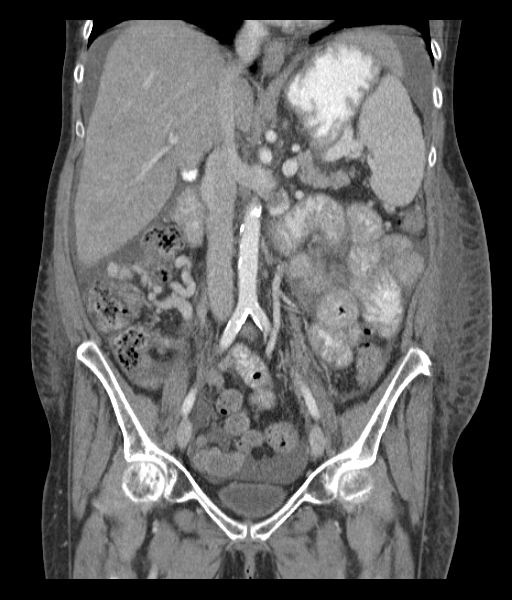

[16 of 46 positions shown; findings below may reference images not displayed]

FINDINGS: The visualized lung bases are clear.

No intra-abdominal free air. Small ascites, diffuse mesenteric and
subcutaneous soft tissue edema, and anasarca.

Morphologic changes of cirrhosis. There is a 1.9 cm linear
hypodensity extending from the inferior surface of the right lobe of
the liver which may represent an age indeterminate laceration or a
fissure. No active hemorrhage identified. There multiple stones
within the gallbladder. The pancreas appears unremarkable.
Top-normal spleen size measuring up to 13 cm. The adrenal glands
appear unremarkable. There is no hydronephrosis. The visualized
ureters, the urinary bladder, the uterus, and the ovaries appear
grossly unremarkable.

There is moderate stool throughout the colon. No evidence of bowel
obstruction or inflammation. The the appendix is not visualized with
certainty. A linear structure extending from the cecum (series 2,
image 50 and coronal image [DATE] represent a normal appendix.

Mild aortoiliac atherosclerotic disease. The origins of the celiac
axis, SMA, IMA as well as the origins of the renal arteries are
patent. The splenic vein is patent. Prominent subcutaneous
collateral veins noted in the right lower quadrant and pericecal
region. No portal venous gas identified. There is no adenopathy.

There is a small fat containing umbilical hernia. There is extension
of ascitic fluid into the umbilical hernia. There is diffuse
subcutaneous soft tissue edema and anasarca. There is mild
degenerative changes of the spine. No acute fracture. Left femoral
head cortical sclerosis with linear lucency compatible with
avascular necrosis.
IMPRESSION: Cirrhosis with small ascites, diffuse mesenteric edema and anasarca.

Cholelithiasis.

Linear hypodensity in the inferior surface of the right lobe of the
liver may represent an age indeterminate laceration or a fissure. No
evidence of active hemorrhage.

Constipation.  No evidence of bowel obstruction or inflammation.

## 2016-03-25 ENCOUNTER — Ambulatory Visit (HOSPITAL_COMMUNITY): Payer: Medicare Other

## 2016-04-01 ENCOUNTER — Encounter (HOSPITAL_COMMUNITY): Payer: Self-pay

## 2016-04-01 ENCOUNTER — Ambulatory Visit (HOSPITAL_COMMUNITY)
Admission: RE | Admit: 2016-04-01 | Discharge: 2016-04-01 | Disposition: A | Discharge status: Home / Self Care | Payer: Medicare Other | Source: Ambulatory Visit | Attending: Gastroenterology | Admitting: Gastroenterology

## 2016-04-01 DIAGNOSIS — R188 Other ascites: Secondary | ICD-10-CM | POA: Diagnosis not present

## 2016-04-01 MED ORDER — ALBUMIN HUMAN 25 % IV SOLN
INTRAVENOUS | Status: AC
  Administered 2016-04-01: 25 g via INTRAVENOUS
  Filled 2016-04-01: qty 100

## 2016-04-01 MED ORDER — ALBUMIN HUMAN 25 % IV SOLN
25.0000 g | Freq: Once | INTRAVENOUS | Status: AC
  Administered 2016-04-01: 25 g via INTRAVENOUS

## 2016-04-01 NOTE — Progress Notes (Signed)
Paracentesis complete no signs of distress. 4100 ml red colored ascites removed.

## 2016-04-08 ENCOUNTER — Ambulatory Visit (HOSPITAL_COMMUNITY): Admission: RE | Admit: 2016-04-08 | Payer: Medicare Other | Source: Ambulatory Visit

## 2016-04-09 ENCOUNTER — Ambulatory Visit (HOSPITAL_COMMUNITY)
Admission: RE | Admit: 2016-04-09 | Discharge: 2016-04-09 | Disposition: A | Discharge status: Home / Self Care | Payer: Medicare Other | Source: Ambulatory Visit | Attending: Gastroenterology | Admitting: Gastroenterology

## 2016-04-09 ENCOUNTER — Encounter: Payer: Self-pay | Admitting: Gastroenterology

## 2016-04-09 DIAGNOSIS — K7460 Unspecified cirrhosis of liver: Secondary | ICD-10-CM | POA: Insufficient documentation

## 2016-04-09 DIAGNOSIS — R188 Other ascites: Secondary | ICD-10-CM | POA: Diagnosis not present

## 2016-04-09 MED ORDER — ALBUMIN HUMAN 25 % IV SOLN
INTRAVENOUS | Status: AC
  Administered 2016-04-09: 25 g via INTRAVENOUS
  Filled 2016-04-09: qty 200

## 2016-04-09 MED ORDER — ALBUMIN HUMAN 25 % IV SOLN
25.0000 g | Freq: Once | INTRAVENOUS | Status: AC
  Administered 2016-04-09: 25 g via INTRAVENOUS

## 2016-04-09 NOTE — Progress Notes (Signed)
Paracentesis complete no signs of distress. 4600 ml amber colored ascites removed.  

## 2016-04-09 NOTE — Procedures (Signed)
PreOperative Dx: Cirrhosis, ascites Postoperative Dx: Cirrhosis, ascites Procedure:   US guided paracentesis Radiologist:  Geralyn Figiel Anesthesia:  10 ml of1% lidocaine Specimen:  4.6 L of yellow ascitic fluid EBL:   < 1 ml Complications: None   

## 2016-04-15 ENCOUNTER — Ambulatory Visit (HOSPITAL_COMMUNITY)
Admission: RE | Admit: 2016-04-15 | Discharge: 2016-04-15 | Disposition: A | Discharge status: Home / Self Care | Payer: Medicare Other | Source: Ambulatory Visit | Attending: Gastroenterology | Admitting: Gastroenterology

## 2016-04-15 ENCOUNTER — Encounter (HOSPITAL_COMMUNITY): Payer: Self-pay

## 2016-04-15 DIAGNOSIS — R188 Other ascites: Secondary | ICD-10-CM | POA: Insufficient documentation

## 2016-04-15 NOTE — Progress Notes (Signed)
Paracentesis complete no signs of distress.  

## 2016-04-16 ENCOUNTER — Telehealth: Payer: Self-pay | Admitting: Gastroenterology

## 2016-04-21 ENCOUNTER — Encounter: Payer: Self-pay | Admitting: Gastroenterology

## 2016-04-21 ENCOUNTER — Encounter (HOSPITAL_COMMUNITY): Payer: Self-pay

## 2016-04-21 ENCOUNTER — Ambulatory Visit (HOSPITAL_COMMUNITY)
Admission: RE | Admit: 2016-04-21 | Discharge: 2016-04-21 | Disposition: A | Discharge status: Home / Self Care | Payer: Medicare Other | Source: Ambulatory Visit | Attending: Gastroenterology | Admitting: Gastroenterology

## 2016-04-21 ENCOUNTER — Telehealth: Payer: Self-pay | Admitting: Gastroenterology

## 2016-04-21 ENCOUNTER — Ambulatory Visit (INDEPENDENT_AMBULATORY_CARE_PROVIDER_SITE_OTHER): Payer: Medicare Other | Admitting: Gastroenterology

## 2016-04-21 VITALS — BP 120/72 | HR 114 | Temp 98.3°F | Ht 61.0 in | Wt 138.0 lb

## 2016-04-21 DIAGNOSIS — K7581 Nonalcoholic steatohepatitis (NASH): Secondary | ICD-10-CM | POA: Insufficient documentation

## 2016-04-21 DIAGNOSIS — K7460 Unspecified cirrhosis of liver: Secondary | ICD-10-CM

## 2016-04-21 DIAGNOSIS — R188 Other ascites: Secondary | ICD-10-CM | POA: Diagnosis present

## 2016-04-21 IMAGING — US US PARACENTESIS
1 series · 3 of 3 positions shown · non-contrast
Comparison: none

INDICATION: Cirrhosis, ascites

[Series 1: us paracentesis · 0.16mm/px · 3 of 3 slices shown]
[im 1/3]
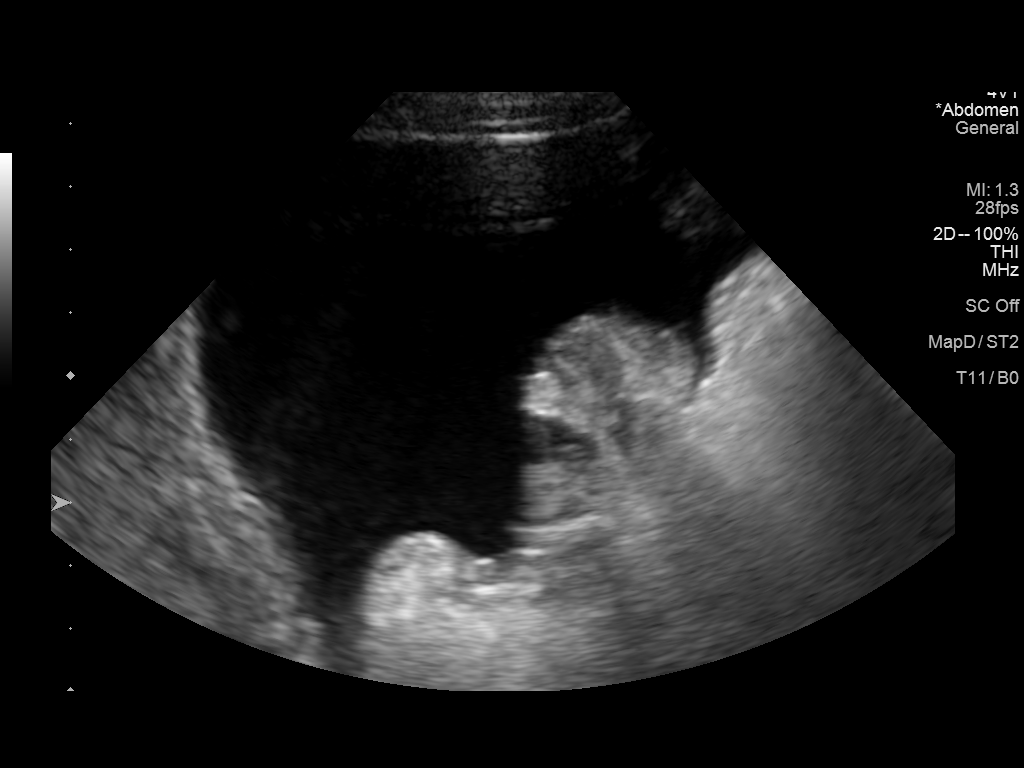
[im 2/3]
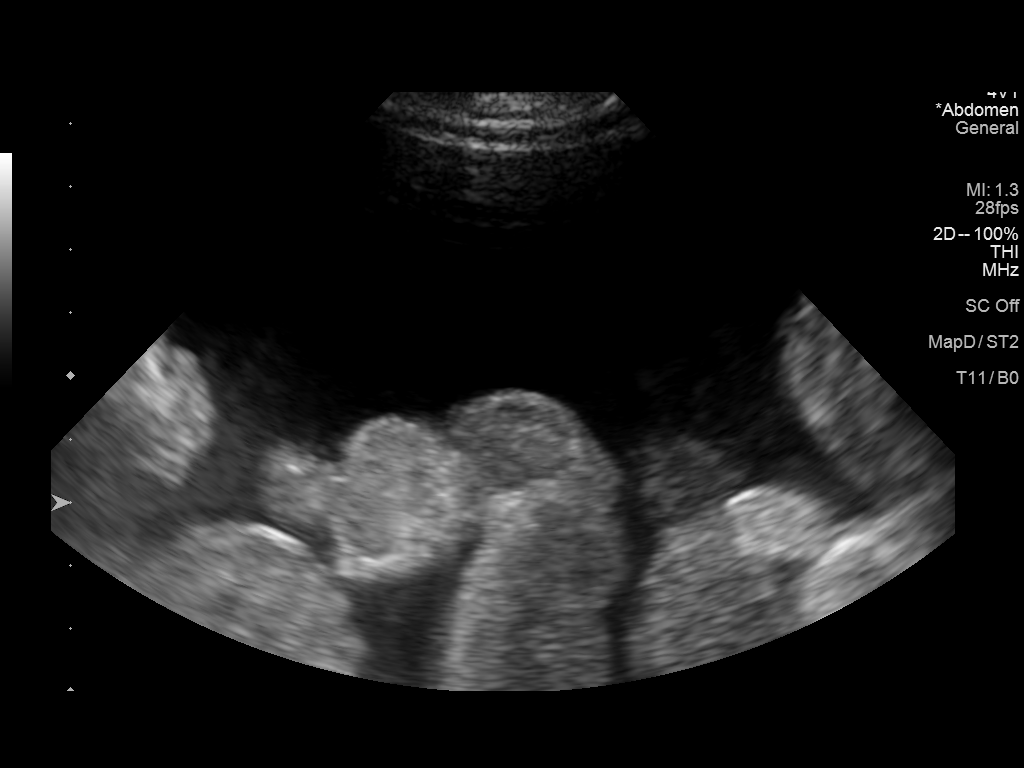
[im 3/3]
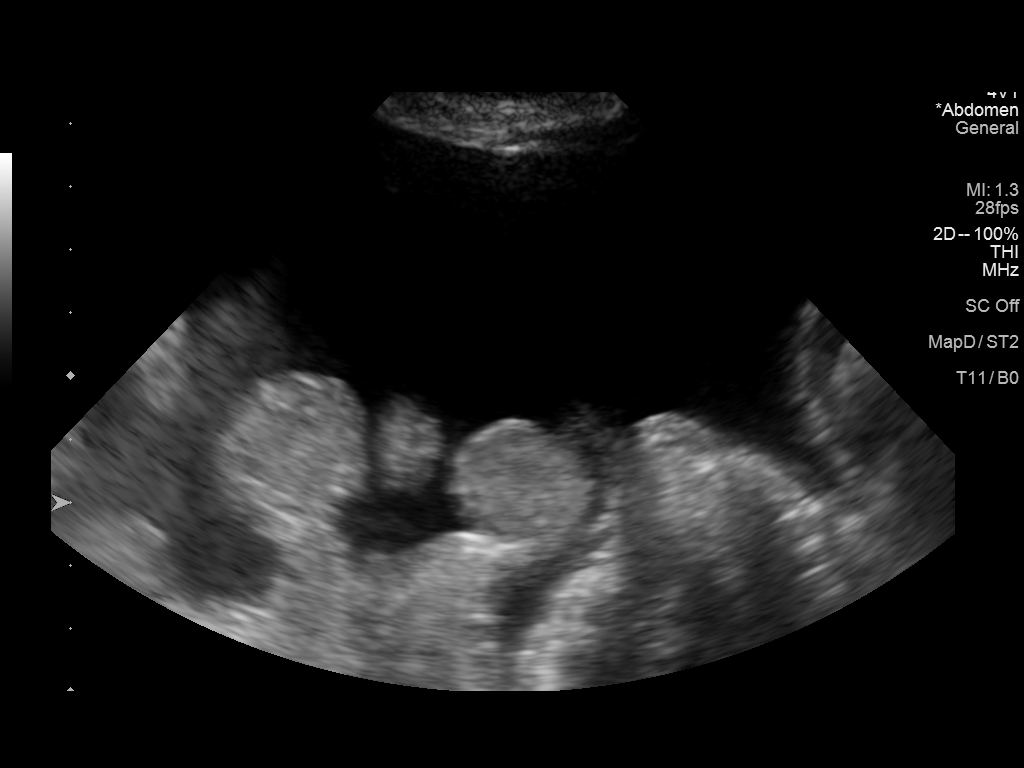

[3 of 3 positions shown; findings below may reference images not displayed]

EXAM:
ULTRASOUND GUIDED THERAPEUTIC PARACENTESIS

PROCEDURE:
Procedure, benefits, and risks of procedure were discussed with
patient.

Written informed consent for procedure was obtained.

Time out protocol followed.

Ascites localized by ultrasound in the RIGHT lower quadrant

Skin prepped and draped in usual sterile fashion.

Skin and soft tissues anesthetized with 10 mL of 1% lidocaine.

5 French Yueh catheter placed into the ascites in the RIGHT lower
quadrant.

9294 mL of amber colored ascitic fluid was aspirated by vacuum
bottle suction.

Procedure tolerated well by patient without immediate complication.
FINDINGS: As above
IMPRESSION: Successful ultrasound guided paracentesis yielding 9294 mL of
ascitic fluid.
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
## 2016-04-21 NOTE — Assessment & Plan Note (Signed)
40 year old female with decompensated cirrhosis, recurrent ascites requiring frequent paracenteses. Today with anasarca on exam as culprit for patient's reports of "swelling" on her hip and lower abdomen. Appears lower pubic area is edematous but no signs of cellulitis or infection. Tense ascites noted on exam. Unfortunately, she was not able to have dialysis today due to closure of facilities in anticipation of weather secondary to [facility]. Options limited with inability to use diuretic therapy, end-stage liver disease, and poor compliance of patient in evaluation for transplant. I discussed with her the importance of getting back to [facility]. She is agreeable to returning for a follow-up.  Paracentesis today with 12.5 g IV albumin at start. Dialysis tomorrow. Standing orders for paracentesis already on file. Referral back to [facility]. EGD in Jan 2018 for variceal surveillance.

## 2016-04-21 NOTE — Patient Instructions (Signed)
Please have paracentesis done today.  You will have dialysis tomorrow.  I have referred you back to [facility].

## 2016-04-21 NOTE — Telephone Encounter (Signed)
Needs routine follow-up in Dec 2017 to arrange EGD for Jan 2018 for variceal surveillance.

## 2016-04-21 NOTE — Procedures (Signed)
PreOperative Dx: Cirrhosis due to NASH, ascites Postoperative Dx: Cirrhosis due to NASH, ascites Procedure:   US guided paracentesis Radiologist:  Thornton Papas Anesthesia:  10 ml of1% lidocaine Specimen:  2.6 L of cloudy yellow ascitic fluid EBL:   < 1 ml Complications: None

## 2016-04-22 ENCOUNTER — Encounter: Payer: Self-pay | Admitting: Gastroenterology

## 2016-04-22 NOTE — Progress Notes (Signed)
CC'ED TO PCP 

## 2016-04-28 ENCOUNTER — Inpatient Hospital Stay (HOSPITAL_COMMUNITY)
Admission: EM | Admit: 2016-04-28 | Discharge: 2016-05-02 | DRG: 368 | Disposition: A | Discharge status: Home / Self Care | Payer: Medicare Other | Attending: Internal Medicine | Admitting: Internal Medicine

## 2016-04-28 ENCOUNTER — Telehealth: Payer: Self-pay

## 2016-04-28 ENCOUNTER — Encounter (HOSPITAL_COMMUNITY): Payer: Self-pay | Admitting: Emergency Medicine

## 2016-04-28 DIAGNOSIS — R109 Unspecified abdominal pain: Secondary | ICD-10-CM

## 2016-04-28 DIAGNOSIS — D6959 Other secondary thrombocytopenia: Secondary | ICD-10-CM | POA: Diagnosis present

## 2016-04-28 DIAGNOSIS — Z87891 Personal history of nicotine dependence: Secondary | ICD-10-CM

## 2016-04-28 DIAGNOSIS — K766 Portal hypertension: Secondary | ICD-10-CM | POA: Diagnosis present

## 2016-04-28 DIAGNOSIS — Z9111 Patient's noncompliance with dietary regimen: Secondary | ICD-10-CM

## 2016-04-28 DIAGNOSIS — Z9114 Patient's other noncompliance with medication regimen: Secondary | ICD-10-CM

## 2016-04-28 DIAGNOSIS — F418 Other specified anxiety disorders: Secondary | ICD-10-CM | POA: Diagnosis present

## 2016-04-28 DIAGNOSIS — R188 Other ascites: Secondary | ICD-10-CM | POA: Diagnosis present

## 2016-04-28 DIAGNOSIS — I8500 Esophageal varices without bleeding: Secondary | ICD-10-CM | POA: Diagnosis present

## 2016-04-28 DIAGNOSIS — Z9115 Patient's noncompliance with renal dialysis: Secondary | ICD-10-CM

## 2016-04-28 DIAGNOSIS — K3189 Other diseases of stomach and duodenum: Secondary | ICD-10-CM | POA: Diagnosis present

## 2016-04-28 DIAGNOSIS — I953 Hypotension of hemodialysis: Secondary | ICD-10-CM | POA: Diagnosis present

## 2016-04-28 DIAGNOSIS — K921 Melena: Secondary | ICD-10-CM

## 2016-04-28 DIAGNOSIS — K449 Diaphragmatic hernia without obstruction or gangrene: Secondary | ICD-10-CM | POA: Diagnosis present

## 2016-04-28 DIAGNOSIS — K226 Gastro-esophageal laceration-hemorrhage syndrome: Principal | ICD-10-CM | POA: Diagnosis present

## 2016-04-28 DIAGNOSIS — G8929 Other chronic pain: Secondary | ICD-10-CM

## 2016-04-28 DIAGNOSIS — E440 Moderate protein-calorie malnutrition: Secondary | ICD-10-CM | POA: Diagnosis present

## 2016-04-28 DIAGNOSIS — D62 Acute posthemorrhagic anemia: Secondary | ICD-10-CM | POA: Diagnosis present

## 2016-04-28 DIAGNOSIS — K7460 Unspecified cirrhosis of liver: Secondary | ICD-10-CM | POA: Diagnosis present

## 2016-04-28 DIAGNOSIS — Z8249 Family history of ischemic heart disease and other diseases of the circulatory system: Secondary | ICD-10-CM

## 2016-04-28 DIAGNOSIS — Z8719 Personal history of other diseases of the digestive system: Secondary | ICD-10-CM | POA: Diagnosis present

## 2016-04-28 DIAGNOSIS — K7581 Nonalcoholic steatohepatitis (NASH): Secondary | ICD-10-CM | POA: Diagnosis present

## 2016-04-28 DIAGNOSIS — E8889 Other specified metabolic disorders: Secondary | ICD-10-CM | POA: Diagnosis present

## 2016-04-28 DIAGNOSIS — D631 Anemia in chronic kidney disease: Secondary | ICD-10-CM | POA: Diagnosis present

## 2016-04-28 DIAGNOSIS — K922 Gastrointestinal hemorrhage, unspecified: Secondary | ICD-10-CM

## 2016-04-28 DIAGNOSIS — Z992 Dependence on renal dialysis: Secondary | ICD-10-CM

## 2016-04-28 DIAGNOSIS — D696 Thrombocytopenia, unspecified: Secondary | ICD-10-CM | POA: Diagnosis present

## 2016-04-28 DIAGNOSIS — N186 End stage renal disease: Secondary | ICD-10-CM | POA: Diagnosis present

## 2016-04-28 DIAGNOSIS — E871 Hypo-osmolality and hyponatremia: Secondary | ICD-10-CM | POA: Diagnosis present

## 2016-04-28 DIAGNOSIS — K219 Gastro-esophageal reflux disease without esophagitis: Secondary | ICD-10-CM | POA: Diagnosis present

## 2016-04-28 DIAGNOSIS — Z6828 Body mass index (BMI) 28.0-28.9, adult: Secondary | ICD-10-CM

## 2016-04-28 DIAGNOSIS — R0989 Other specified symptoms and signs involving the circulatory and respiratory systems: Secondary | ICD-10-CM

## 2016-04-28 LAB — CBC WITH DIFFERENTIAL/PLATELET
BASOS ABS: 0.1 10*3/uL (ref 0.0–0.1)
BASOS PCT: 1 %
EOS ABS: 0.4 10*3/uL (ref 0.0–0.7)
EOS PCT: 5 %
HCT: 26.7 % — ABNORMAL LOW (ref 36.0–46.0)
Hemoglobin: 8.7 g/dL — ABNORMAL LOW (ref 12.0–15.0)
LYMPHS PCT: 16 %
Lymphs Abs: 1.5 10*3/uL (ref 0.7–4.0)
MCH: 33.5 pg (ref 26.0–34.0)
MCHC: 32.6 g/dL (ref 30.0–36.0)
MCV: 102.7 fL — AB (ref 78.0–100.0)
Monocytes Absolute: 0.6 10*3/uL (ref 0.1–1.0)
Monocytes Relative: 7 %
Neutro Abs: 6.8 10*3/uL (ref 1.7–7.7)
Neutrophils Relative %: 71 %
Platelets: 132 10*3/uL — ABNORMAL LOW (ref 150–400)
RBC: 2.6 MIL/uL — AB (ref 3.87–5.11)
RDW: 15.5 % (ref 11.5–15.5)
WBC: 9.4 10*3/uL (ref 4.0–10.5)

## 2016-04-28 LAB — COMPREHENSIVE METABOLIC PANEL
ALBUMIN: 3 g/dL — AB (ref 3.5–5.0)
ALK PHOS: 104 U/L (ref 38–126)
ALT: 31 U/L (ref 14–54)
AST: 55 U/L — ABNORMAL HIGH (ref 15–41)
Anion gap: 21 — ABNORMAL HIGH (ref 5–15)
BUN: 120 mg/dL — AB (ref 6–20)
CO2: 16 mmol/L — ABNORMAL LOW (ref 22–32)
Calcium: 8.8 mg/dL — ABNORMAL LOW (ref 8.9–10.3)
Chloride: 94 mmol/L — ABNORMAL LOW (ref 101–111)
Creatinine, Ser: 10.85 mg/dL — ABNORMAL HIGH (ref 0.44–1.00)
GFR calc non Af Amer: 4 mL/min — ABNORMAL LOW (ref >60)
GFR, EST AFRICAN AMERICAN: 4 mL/min — AB (ref >60)
Glucose, Bld: 91 mg/dL (ref 65–99)
POTASSIUM: 4 mmol/L (ref 3.5–5.1)
SODIUM: 131 mmol/L — AB (ref 135–145)
TOTAL PROTEIN: 6.6 g/dL (ref 6.5–8.1)
Total Bilirubin: 1 mg/dL (ref 0.3–1.2)

## 2016-04-28 LAB — POC OCCULT BLOOD, ED: Fecal Occult Bld: NEGATIVE

## 2016-04-28 LAB — PROTIME-INR
INR: 1.11
PROTHROMBIN TIME: 14.4 s (ref 11.4–15.2)

## 2016-04-28 LAB — LIPASE, BLOOD: LIPASE: 188 U/L — AB (ref 11–51)

## 2016-04-28 LAB — APTT: APTT: 31 s (ref 24–36)

## 2016-04-28 MED ORDER — FENTANYL CITRATE (PF) 100 MCG/2ML IJ SOLN: 50.0000 ug | Freq: Once | INTRAMUSCULAR | Status: DC

## 2016-04-28 NOTE — ED Provider Notes (Signed)
By signing my name below, I, Gwenlyn Fudge, attest that this documentation has been prepared under the direction and in the presence of [place], DO. Electronically Signed: Gwenlyn Fudge, ED Scribe. 04/28/16. 11:40 PM.  TIME SEEN: 11:31 PM  CHIEF COMPLAINT: Rectal Bleeding  HPI: Wanda [name] is a 40 y.o. female with PMHx of Liver cirrhosis with recurrent ascites requiring regular paracentesis, ESRD on HD T/Th/Fri and history of esophageal varices with previous episodes of hematemesis requiring transfusion who presents to the Emergency Department complaining of episodic melena onset last night around 2:30 am. Last night the stool was black and tarry, but this morning at 8 AM it was black stool but it was loose. No bowel movement since that time. She reports associated painful urination for a couple weeks. Pt has dialysis Tuesdays, Thursdays, and Fridays. Pt did not attend dialysis today because she did not feel well. She has been on dialysis for about 1 year. She still makes urine.  Pt does not take iron tablets but is given IV iron when she attends dialysis. Pt has not taken Pepto Bismol.  Pt denies fever. Pt's most recent paracentesis wast last week and is scheduled for one tomorrow.  Has chronic diffuse abdominal pain that is not worse than normal. No vomiting. No hematemesis. No hematochezia.   ROS: See HPI Constitutional: no fever  Eyes: no drainage  ENT: no runny nose   Cardiovascular:  no chest pain  Resp: no SOB  GI: no vomiting GU: dysuria Integumentary: no rash  Allergy: no hives  Musculoskeletal: no leg swelling  Neurological: no slurred speech ROS otherwise negative  PAST MEDICAL HISTORY/PAST SURGICAL HISTORY:  Past Medical History:  Diagnosis Date  . Acute blood loss anemia 02/25/2014   Status post transfusion  . Acute renal failure ([place]) 09/2013   Pre-renal- resolved  . Anasarca 10/10/2013  . Anxiety   . Bipolar disorder ([place]) 12/04/2013   2007-SEEN IN ED FOR  INVOLUNTARY COMMITMENT, UDS POS FOR AMPHETAMINES/OPIATES   . Bleeding esophageal varices ([place]) 02/28/2014   s/p banding  . C. difficile colitis 04/19/2014  . Chronic hypotension   . Cirrhosis ([place]) 10/05/13   Liver bx 11/23/13 (delayed initially due to patient refusal). c/w steatohepatitis  . Cirrhosis of liver with ascites ([place])   . Depression   . ESRD (end stage renal disease) on dialysis ([place]) 08/2014  . Folate deficiency 09/2013  . Gastroesophageal junction ulcer 09/17/2014  . GERD (gastroesophageal reflux disease)   . Hematemesis/vomiting blood 02/24/2014  . Macrocytosis 02/28/2014  . PNA (pneumonia) 10/13/2013  . SBP (spontaneous bacterial peritonitis) ([place]) 11/10/2013  . Thrombocytopenia ([place])    Hypercellular bone marrow; abundant megakaryocytes per 08/27/2014; s/p bone marrow bx    MEDICATIONS:  Prior to Admission medications   Medication Sig Start Date End Date Taking? Authorizing Provider  clotrimazole (LOTRIMIN) 1 % cream APPLY TOPICALLY THREE TIMES DAILY FOR 10 DAYS 03/19/16  Yes Carlis Stable, NP  Darbepoetin Alfa (ARANESP) 60 MCG/0.3ML SOSY injection Inject 0.3 mLs (60 mcg total) into the vein every Thursday with hemodialysis. 06/09/15  Yes Cherene Altes, MD  lactulose ([place]) 10 GM/15ML solution TAKE 3 TEASPOONSFUL (15ML) BY MOUTH TWICE DAILY Patient taking differently: 7.5-15mls by mouth once daily 07/22/15  Yes Mahala Menghini, PA-C  lidocaine (XYLOCAINE) 2 % solution TAKE TWO TEASPOONSFUL (10ML) BY MOUTH BEFORE MEALS AND AT BEDTIME TO PREVENT CHEST PAIN WHILE EATING 03/19/16  Yes Carlis Stable, NP  lidocaine-prilocaine (EMLA) cream Apply 1 application  topically as needed (for dialysis treatments).   Yes Historical Provider, MD  LORazepam (ATIVAN) 1 MG tablet TAKE 1/2 TO 1 TABLET BY MOUTH TWICE DAILY AS NEEDED FOR BACK SPASM OR FOR ANXIETY 01/29/16  Yes Danie Binder, MD  midodrine (PROAMATINE) 10 MG tablet Take 1 tablet (10 mg total) by mouth daily. *Takes only on dialysis days on  Tuesdays, Thursdays, and Saturdays (Sometimes takes treatments on Friday instead of Saturday)**Medication to keep your blood pressure from falling. Patient taking differently: Take 10 mg by mouth as directed. *Takes only on dialysis days on Tuesdays, Thursdays, and Saturdays (Sometimes takes treatments on Friday instead of Saturday)**Medication to keep your blood pressure from falling. 06/09/15  Yes Cherene Altes, MD  Oxycodone HCl 10 MG TABS 1 PO TID AS NEEDED FOR PAIN Patient taking differently: Take 10 mg by mouth 3 (three) times daily as needed.  02/05/16  Yes Danie Binder, MD  pantoprazole (PROTONIX) 40 MG tablet Take 1 tablet (40 mg total) by mouth 2 (two) times daily. Patient taking differently: Take 40 mg by mouth 2 (two) times daily as needed (acid reflux).  09/19/14  Yes Kathie Dike, MD  promethazine (PHENERGAN) 12.5 MG tablet TAKE ONE TABLET BY MOUTH EVERY 6 HOURS AS NEEDED FOR NAUSEA AND VOMITING. 09/06/15  Yes Mahala Menghini, PA-C  RENVELA 800 MG tablet 1 TABLET 3 TIMES A DAY WITH MEALS 10/29/15  Yes Historical Provider, MD  sucralfate (CARAFATE) 1 GM/10ML suspension Take 10 mLs (1 g total) by mouth 4 (four) times daily. Patient not taking: Reported on 04/28/2016 07/10/15   Danie Binder, MD    ALLERGIES:  Allergies  Allergen Reactions  . Lasix [Furosemide]     "doesn't work"  . Latex Itching  . Morphine And Related Hives and Itching    SOCIAL HISTORY:  Social History  Substance Use Topics  . Smoking status: Former Smoker    Packs/day: 0.25    Years: 20.00    Types: Cigarettes    Quit date: 11/05/2008  . Smokeless tobacco: Never Used     Comment: Never really smoked much  . Alcohol use No    FAMILY HISTORY: Family History  Problem Relation Age of Onset  . Heart disease Mother   . Colon cancer Neg Hx   . Liver disease Neg Hx     EXAM: BP 111/55 (BP Location: Left Arm)   Pulse 101   Temp 98 F (36.7 C) (Oral)   Resp 18   Ht 5\' 1"  (1.549 m)   Wt 150 lb  (68 kg)   LMP 09/19/2013   SpO2 100%   BMI 28.34 kg/m  CONSTITUTIONAL: Alert and oriented and responds appropriately to questions. Chronically ill-appearing, in no distress HEAD: Normocephalic EYES: Conjunctivae clear, PERRL ENT: normal nose; no rhinorrhea; moist mucous membranes NECK: Supple, no meningismus, no LAD  CARD: Regular and minimally tachycardic, S1 and S2 appreciated; no murmurs, no clicks, no rubs, no gallops RESP: Normal chest excursion without splinting or tachypnea; breath sounds clear and equal bilaterally; no wheezes, no rhonchi, no rales, no hypoxia or respiratory distress, speaking full sentences ABD/GI: Normal bowel sounds; abdomen is distended with fluid wave, soft, mildly tender to palpation diffusely, swelling noted to the skin of the lower abdomen without signs of cellulitis RECTAL:  Normal rectal tone, no gross blood or melena, guaiac negative, no hemorrhoids appreciated, nontender rectal exam, no fecal impaction, no stool in the rectal vault BACK:  The back appears normal and is  non-tender to palpation, there is no CVA tenderness EXT: Normal ROM in all joints; non-tender to palpation; no edema; normal capillary refill; no cyanosis, no calf tenderness or swelling    SKIN: Normal color for age and race; warm; no rash NEURO: Moves all extremities equally, sensation to light touch intact diffusely, cranial nerves II through XII intact PSYCH: The patient's mood and manner are appropriate. Grooming and personal hygiene are appropriate.  MEDICAL DECISION MAKING: Pt here with melena. She has not had any further bowel movements in over 12 hours. Her hemoglobin has dropped from 11.1 in May to 8.7 today. This could be because of her end-stage renal disease but also could be because of several episodes of black and tarry stools. She is mainly tachycardic but otherwise hemodynamically stable. No vomiting. Complains of chronic abdominal pain. No fever or anything to suggest SBP. Mr.  dialysis today but is not hypertensive, does not appear significantly volume overloaded, is not short of breath, not hypoxic and does not have hyperkalemia. Has a paracentesis scheduled tomorrow. We'll discuss with her gastroenterologist for further recommendations.  ED PROGRESS:   12:00 AM  Spoke to the Dr. Oneida Alar, GI, who recommends observation admission to medicine. She recommended if patient has multiple antibodies in her type and screen that she be transferred to [facility].  12:40 AM  Confirmed with blood bank that patient is antibody negative. Discussed patient's case with Dr. Marin Comment with hospitalist service who agrees to see patient for admission. He will place holding orders. Patient and her family have been updated with plan.   I reviewed all nursing notes, vitals, pertinent old records, EKGs, labs, imaging (as available).   I personally performed the services described in this documentation, which was scribed in my presence. The recorded information has been reviewed and is accurate.    [place], DO 04/29/16 [phone]

## 2016-04-28 NOTE — ED Triage Notes (Signed)
Pt has liver disease, notice black stools last night, no better today. Pt is did not go to dialysis today

## 2016-04-28 NOTE — Telephone Encounter (Signed)
-----   Message from Danie Binder, MD sent at 04/28/2016 12:33 PM EDT ----- THIS IS A TELEPHONE CALL NOT A STAFF MESSAGE PLEASE PUT IN SYSTEM AS A TC.  ----- Message ----- From: Hassan Rowan, LPN Sent: [id number]  12:14 PM To: Danie Binder, MD  Pt's boyfriend [name] called and said he is taking Wanda [name] to the er. Her stools are black like tar.

## 2016-04-28 NOTE — Telephone Encounter (Signed)
REVIEWED. AGREE.WILL AWAIT EVAL IN ED.

## 2016-04-29 ENCOUNTER — Ambulatory Visit (HOSPITAL_COMMUNITY): Payer: Medicare Other

## 2016-04-29 ENCOUNTER — Encounter (HOSPITAL_COMMUNITY): Payer: Self-pay | Admitting: Internal Medicine

## 2016-04-29 ENCOUNTER — Inpatient Hospital Stay (HOSPITAL_COMMUNITY): Payer: Medicare Other

## 2016-04-29 DIAGNOSIS — Z6828 Body mass index (BMI) 28.0-28.9, adult: Secondary | ICD-10-CM | POA: Diagnosis not present

## 2016-04-29 DIAGNOSIS — Z8249 Family history of ischemic heart disease and other diseases of the circulatory system: Secondary | ICD-10-CM | POA: Diagnosis not present

## 2016-04-29 DIAGNOSIS — K7581 Nonalcoholic steatohepatitis (NASH): Secondary | ICD-10-CM | POA: Diagnosis present

## 2016-04-29 DIAGNOSIS — Z87891 Personal history of nicotine dependence: Secondary | ICD-10-CM | POA: Diagnosis not present

## 2016-04-29 DIAGNOSIS — I8500 Esophageal varices without bleeding: Secondary | ICD-10-CM | POA: Diagnosis present

## 2016-04-29 DIAGNOSIS — F418 Other specified anxiety disorders: Secondary | ICD-10-CM | POA: Diagnosis present

## 2016-04-29 DIAGNOSIS — E871 Hypo-osmolality and hyponatremia: Secondary | ICD-10-CM | POA: Diagnosis present

## 2016-04-29 DIAGNOSIS — D6959 Other secondary thrombocytopenia: Secondary | ICD-10-CM | POA: Diagnosis present

## 2016-04-29 DIAGNOSIS — R188 Other ascites: Secondary | ICD-10-CM | POA: Diagnosis present

## 2016-04-29 DIAGNOSIS — K921 Melena: Secondary | ICD-10-CM | POA: Diagnosis present

## 2016-04-29 DIAGNOSIS — R109 Unspecified abdominal pain: Secondary | ICD-10-CM | POA: Diagnosis not present

## 2016-04-29 DIAGNOSIS — E8889 Other specified metabolic disorders: Secondary | ICD-10-CM | POA: Diagnosis present

## 2016-04-29 DIAGNOSIS — D62 Acute posthemorrhagic anemia: Secondary | ICD-10-CM | POA: Diagnosis present

## 2016-04-29 DIAGNOSIS — D631 Anemia in chronic kidney disease: Secondary | ICD-10-CM | POA: Diagnosis present

## 2016-04-29 DIAGNOSIS — Z9115 Patient's noncompliance with renal dialysis: Secondary | ICD-10-CM | POA: Diagnosis not present

## 2016-04-29 DIAGNOSIS — N186 End stage renal disease: Secondary | ICD-10-CM | POA: Diagnosis present

## 2016-04-29 DIAGNOSIS — K449 Diaphragmatic hernia without obstruction or gangrene: Secondary | ICD-10-CM | POA: Diagnosis present

## 2016-04-29 DIAGNOSIS — K766 Portal hypertension: Secondary | ICD-10-CM | POA: Diagnosis present

## 2016-04-29 DIAGNOSIS — K7460 Unspecified cirrhosis of liver: Secondary | ICD-10-CM | POA: Diagnosis present

## 2016-04-29 DIAGNOSIS — Z9111 Patient's noncompliance with dietary regimen: Secondary | ICD-10-CM | POA: Diagnosis not present

## 2016-04-29 DIAGNOSIS — K226 Gastro-esophageal laceration-hemorrhage syndrome: Secondary | ICD-10-CM | POA: Diagnosis present

## 2016-04-29 DIAGNOSIS — K219 Gastro-esophageal reflux disease without esophagitis: Secondary | ICD-10-CM | POA: Diagnosis present

## 2016-04-29 DIAGNOSIS — Z992 Dependence on renal dialysis: Secondary | ICD-10-CM | POA: Diagnosis not present

## 2016-04-29 DIAGNOSIS — K922 Gastrointestinal hemorrhage, unspecified: Secondary | ICD-10-CM

## 2016-04-29 DIAGNOSIS — K3189 Other diseases of stomach and duodenum: Secondary | ICD-10-CM | POA: Diagnosis present

## 2016-04-29 DIAGNOSIS — D696 Thrombocytopenia, unspecified: Secondary | ICD-10-CM | POA: Diagnosis not present

## 2016-04-29 DIAGNOSIS — Z9114 Patient's other noncompliance with medication regimen: Secondary | ICD-10-CM | POA: Diagnosis not present

## 2016-04-29 DIAGNOSIS — K7031 Alcoholic cirrhosis of liver with ascites: Secondary | ICD-10-CM

## 2016-04-29 DIAGNOSIS — E440 Moderate protein-calorie malnutrition: Secondary | ICD-10-CM | POA: Diagnosis present

## 2016-04-29 LAB — CBC
HCT: 26.6 % — ABNORMAL LOW (ref 36.0–46.0)
HCT: 25.9 % — ABNORMAL LOW (ref 36.0–46.0)
HCT: 23.9 % — ABNORMAL LOW (ref 36.0–46.0)
HEMATOCRIT: 20 % — AB (ref 36.0–46.0)
HEMOGLOBIN: 6.4 g/dL — AB (ref 12.0–15.0)
Hemoglobin: 8.7 g/dL — ABNORMAL LOW (ref 12.0–15.0)
Hemoglobin: 8.7 g/dL — ABNORMAL LOW (ref 12.0–15.0)
Hemoglobin: 8 g/dL — ABNORMAL LOW (ref 12.0–15.0)
MCH: 34.1 pg — ABNORMAL HIGH (ref 26.0–34.0)
MCH: 33.3 pg (ref 26.0–34.0)
MCH: 32.1 pg (ref 26.0–34.0)
MCH: 32.1 pg (ref 26.0–34.0)
MCHC: 32.7 g/dL (ref 30.0–36.0)
MCHC: 32 g/dL (ref 30.0–36.0)
MCHC: 33.6 g/dL (ref 30.0–36.0)
MCHC: 33.5 g/dL (ref 30.0–36.0)
MCV: 104.3 fL — AB (ref 78.0–100.0)
MCV: 104.2 fL — AB (ref 78.0–100.0)
MCV: 95.6 fL (ref 78.0–100.0)
MCV: 96 fL (ref 78.0–100.0)
PLATELETS: 110 10*3/uL — AB (ref 150–400)
PLATELETS: 86 10*3/uL — AB (ref 150–400)
Platelets: 92 10*3/uL — ABNORMAL LOW (ref 150–400)
Platelets: 71 10*3/uL — ABNORMAL LOW (ref 150–400)
RBC: 2.55 MIL/uL — AB (ref 3.87–5.11)
RBC: 1.92 MIL/uL — ABNORMAL LOW (ref 3.87–5.11)
RBC: 2.71 MIL/uL — ABNORMAL LOW (ref 3.87–5.11)
RBC: 2.49 MIL/uL — ABNORMAL LOW (ref 3.87–5.11)
RDW: 14.6 % (ref 11.5–15.5)
RDW: 15.7 % — ABNORMAL HIGH (ref 11.5–15.5)
RDW: 17.7 % — AB (ref 11.5–15.5)
RDW: 18.4 % — AB (ref 11.5–15.5)
WBC: 7 10*3/uL (ref 4.0–10.5)
WBC: 7.4 10*3/uL (ref 4.0–10.5)
WBC: 5.8 10*3/uL (ref 4.0–10.5)
WBC: 5.6 10*3/uL (ref 4.0–10.5)

## 2016-04-29 LAB — URINALYSIS, ROUTINE W REFLEX MICROSCOPIC
BILIRUBIN URINE: NEGATIVE
Glucose, UA: NEGATIVE mg/dL
HGB URINE DIPSTICK: NEGATIVE
Ketones, ur: NEGATIVE mg/dL
Leukocytes, UA: NEGATIVE
Nitrite: NEGATIVE
PROTEIN: NEGATIVE mg/dL
Specific Gravity, Urine: 1.02 (ref 1.005–1.030)
pH: 5.5 (ref 5.0–8.0)

## 2016-04-29 LAB — MRSA PCR SCREENING: MRSA BY PCR: NEGATIVE

## 2016-04-29 LAB — PREPARE RBC (CROSSMATCH)

## 2016-04-29 MED ORDER — SODIUM CHLORIDE 0.9 % IV SOLN
80.0000 mg | Freq: Once | INTRAVENOUS | Status: AC
  Administered 2016-04-29: 80 mg via INTRAVENOUS
  Filled 2016-04-29: qty 80

## 2016-04-29 MED ORDER — SODIUM CHLORIDE 0.9 % IV SOLN
INTRAVENOUS | Status: DC
  Administered 2016-04-29 – 2016-05-01 (×6): via INTRAVENOUS

## 2016-04-29 MED ORDER — OXYCODONE HCL 5 MG PO TABS
10.0000 mg | ORAL_TABLET | Freq: Once | ORAL | Status: AC
  Administered 2016-04-29: 10 mg via ORAL
  Filled 2016-04-29: qty 2

## 2016-04-29 MED ORDER — SODIUM CHLORIDE 0.9 % IV SOLN: 100.0000 mL | INTRAVENOUS | Status: DC | PRN

## 2016-04-29 MED ORDER — OCTREOTIDE ACETATE 500 MCG/ML IJ SOLN
INTRAMUSCULAR | Status: AC
  Filled 2016-04-29: qty 1

## 2016-04-29 MED ORDER — ALBUMIN HUMAN 25 % IV SOLN
25.0000 g | Freq: Once | INTRAVENOUS | Status: AC
  Administered 2016-04-29: 25 g via INTRAVENOUS
  Filled 2016-04-29: qty 100

## 2016-04-29 MED ORDER — DEXTROSE 5 % IV SOLN
2.0000 g | INTRAVENOUS | Status: DC
  Administered 2016-04-29 – 2016-05-02 (×4): 2 g via INTRAVENOUS
  Filled 2016-04-29 (×6): qty 2

## 2016-04-29 MED ORDER — SODIUM CHLORIDE 0.9 % IV SOLN
50.0000 ug/h | INTRAVENOUS | Status: DC
  Administered 2016-04-29 – 2016-04-30 (×5): 50 ug/h via INTRAVENOUS
  Filled 2016-04-29 (×15): qty 1

## 2016-04-29 MED ORDER — PANTOPRAZOLE SODIUM 40 MG IV SOLR: 40.0000 mg | Freq: Two times a day (BID) | INTRAVENOUS | Status: DC

## 2016-04-29 MED ORDER — EPOETIN ALFA 10000 UNIT/ML IJ SOLN
INTRAMUSCULAR | Status: AC
  Administered 2016-04-29: 10000 [IU] via SUBCUTANEOUS
  Filled 2016-04-29: qty 1

## 2016-04-29 MED ORDER — LORAZEPAM 1 MG PO TABS
1.0000 mg | ORAL_TABLET | Freq: Four times a day (QID) | ORAL | Status: DC | PRN
  Administered 2016-05-01 – 2016-05-02 (×3): 1 mg via ORAL
  Filled 2016-04-29 (×3): qty 1

## 2016-04-29 MED ORDER — PANTOPRAZOLE SODIUM 40 MG PO TBEC: 40.0000 mg | DELAYED_RELEASE_TABLET | Freq: Two times a day (BID) | ORAL | Status: DC | PRN

## 2016-04-29 MED ORDER — PANTOPRAZOLE SODIUM 40 MG IV SOLR
INTRAVENOUS | Status: AC
  Filled 2016-04-29: qty 160

## 2016-04-29 MED ORDER — LIDOCAINE-PRILOCAINE 2.5-2.5 % EX CREA
1.0000 "application " | TOPICAL_CREAM | CUTANEOUS | Status: DC | PRN
  Filled 2016-04-29: qty 5

## 2016-04-29 MED ORDER — PANTOPRAZOLE SODIUM 40 MG IV SOLR
8.0000 mg/h | INTRAVENOUS | Status: DC
  Administered 2016-04-29 – 2016-04-30 (×4): 8 mg/h via INTRAVENOUS
  Filled 2016-04-29 (×6): qty 80

## 2016-04-29 MED ORDER — PENTAFLUOROPROP-TETRAFLUOROETH EX AERO
1.0000 "application " | INHALATION_SPRAY | CUTANEOUS | Status: DC | PRN
  Filled 2016-04-29: qty 30

## 2016-04-29 MED ORDER — LACTULOSE 10 GM/15ML PO SOLN
10.0000 g | Freq: Every day | ORAL | Status: DC
  Administered 2016-04-29 – 2016-05-01 (×2): 10 g via ORAL
  Filled 2016-04-29 (×3): qty 30

## 2016-04-29 MED ORDER — SEVELAMER CARBONATE 800 MG PO TABS
800.0000 mg | ORAL_TABLET | Freq: Three times a day (TID) | ORAL | Status: DC
  Administered 2016-04-29 – 2016-05-02 (×7): 800 mg via ORAL
  Filled 2016-04-29 (×8): qty 1

## 2016-04-29 MED ORDER — OCTREOTIDE LOAD VIA INFUSION
50.0000 ug | Freq: Once | INTRAVENOUS | Status: AC
  Administered 2016-04-29: 50 ug via INTRAVENOUS
  Filled 2016-04-29: qty 25

## 2016-04-29 MED ORDER — SODIUM CHLORIDE 0.9 % IV SOLN
Freq: Once | INTRAVENOUS | Status: AC
  Administered 2016-04-30: 13:00:00 via INTRAVENOUS

## 2016-04-29 MED ORDER — METOCLOPRAMIDE HCL 5 MG/5ML PO SOLN
5.0000 mg | Freq: Three times a day (TID) | ORAL | Status: DC
  Administered 2016-04-29 – 2016-04-30 (×4): 5 mg via ORAL
  Filled 2016-04-29 (×9): qty 5

## 2016-04-29 MED ORDER — PROMETHAZINE HCL 25 MG/ML IJ SOLN
12.5000 mg | Freq: Four times a day (QID) | INTRAMUSCULAR | Status: DC | PRN
  Administered 2016-04-29: 12.5 mg via INTRAVENOUS
  Filled 2016-04-29: qty 1

## 2016-04-29 MED ORDER — DARBEPOETIN ALFA 60 MCG/0.3ML IJ SOSY: 60.0000 ug | PREFILLED_SYRINGE | INTRAMUSCULAR | Status: DC

## 2016-04-29 MED ORDER — SODIUM CHLORIDE 0.9 % IV SOLN: Freq: Once | INTRAVENOUS | Status: AC

## 2016-04-29 MED ORDER — MIDODRINE HCL 5 MG PO TABS
10.0000 mg | ORAL_TABLET | ORAL | Status: DC
  Administered 2016-04-29: 10 mg via ORAL
  Filled 2016-04-29: qty 2

## 2016-04-29 MED ORDER — OCTREOTIDE ACETATE 500 MCG/ML IJ SOLN: 50.0000 ug/h | INTRAMUSCULAR | Status: DC

## 2016-04-29 MED ORDER — LIDOCAINE HCL (PF) 1 % IJ SOLN
5.0000 mL | INTRAMUSCULAR | Status: DC | PRN
  Filled 2016-04-29: qty 5

## 2016-04-29 MED ORDER — ONDANSETRON HCL 4 MG/2ML IJ SOLN
4.0000 mg | Freq: Four times a day (QID) | INTRAMUSCULAR | Status: DC | PRN
  Administered 2016-04-29 (×2): 4 mg via INTRAVENOUS
  Filled 2016-04-29 (×2): qty 2

## 2016-04-29 MED ORDER — CEFTRIAXONE SODIUM 2 G IJ SOLR
INTRAMUSCULAR | Status: AC
  Filled 2016-04-29: qty 2

## 2016-04-29 MED ORDER — EPOETIN ALFA 10000 UNIT/ML IJ SOLN
10000.0000 [IU] | Freq: Once | INTRAMUSCULAR | Status: AC
  Administered 2016-04-29: 10000 [IU] via SUBCUTANEOUS

## 2016-04-29 MED ORDER — SODIUM CHLORIDE 0.9 % IV SOLN
Freq: Once | INTRAVENOUS | Status: AC
  Administered 2016-04-29: 11:00:00 via INTRAVENOUS

## 2016-04-29 NOTE — Care Management Important Message (Signed)
Important Message  Patient Details  Name: Wanda ALAMILLA MRN: [id number] Date of Birth: [birth year]   Medicare Important Message Given:  Yes    Sherald Barge, RN 04/29/2016, 1:50 PM

## 2016-04-29 NOTE — H&P (Signed)
History and Physical    Wanda [name] [name] DOB: 07/14/1976 DOA: 04/28/2016  Referring MD/NP/PA: Delice Bison Ward, DO. PCP: [name], MD Outpatient Specialists:   GI : Danie Binder, MD  Nephrology : Fran Lowes, MD  Patient coming from: Home  Chief Complaint: melena.   HPI: Wanda [name] is a 40 y.o. female with medical history significant of liver cirrhosis (NASH), ESRD, and GERD presents to the ED with complaints of black and tarry stool that onset Monday night. Pt reports that her last BM was early Tuesday morning with the same appearance as before. She also reports painful urination that has been present for a couple of weeks. She usually has dialysis on Tuesday, Thursday, and Friday, but she missed her recent Tuesday appointment.  She had EGD which showed varices, and they were banded in the past. For her ascites, she has been getting LVP about once a week thru IR. She also has a fistula in her upper right arm for her dialysis.  EDP had spoken with her GI Dr Oneida Alar, and she recommended overnight admit for observation.  Hospitalist was asked to admit her for same.    ED Course: While in the Ed, sodium 131, chloride 94, BUN 120, Cr 10.85, calcium 8.8, albumin 3.0, Lipase 188, AST 55, platelets 132, and Hgb 8.7. FOBT negative.   Review of Systems: As per HPI otherwise 10 point review of systems negative.    Past Medical History:  Diagnosis Date  . Acute blood loss anemia 02/25/2014   Status post transfusion  . Acute renal failure ([place]) 09/2013   Pre-renal- resolved  . Anasarca 10/10/2013  . Anxiety   . Bipolar disorder ([place]) 12/04/2013   2007-SEEN IN ED FOR INVOLUNTARY COMMITMENT, UDS POS FOR AMPHETAMINES/OPIATES   . Bleeding esophageal varices ([place]) 02/28/2014   s/p banding  . C. difficile colitis 04/19/2014  . Chronic hypotension   . Cirrhosis ([place]) 10/05/13   Liver bx 11/23/13 (delayed initially due to patient refusal). c/w steatohepatitis  . Cirrhosis  of liver with ascites ([place])   . Depression   . ESRD (end stage renal disease) on dialysis ([place]) 08/2014  . Folate deficiency 09/2013  . Gastroesophageal junction ulcer 09/17/2014  . GERD (gastroesophageal reflux disease)   . Hematemesis/vomiting blood 02/24/2014  . Macrocytosis 02/28/2014  . PNA (pneumonia) 10/13/2013  . SBP (spontaneous bacterial peritonitis) ([place]) 11/10/2013  . Thrombocytopenia ([place])    Hypercellular bone marrow; abundant megakaryocytes per 08/27/2014; s/p bone marrow bx    Past Surgical History:  Procedure Laterality Date  . AV FISTULA PLACEMENT Right 11/16/2014   Procedure: Right arm Creation of arteriovenous fistula;  Surgeon: Angelia Mould, MD;  Location: [facility];  Service: Vascular;  Laterality: Right;  . CENTRAL VENOUS CATHETER INSERTION Right   . COLONOSCOPY N/A 12/19/2013   SLF:NO OBVIOUS SOURCE FOR ANEMIA IDETIFIED/ONE COLON POLYP REMOVED/Small internal hemorrhoids  . ESOPHAGEAL BANDING  07/04/2014   Procedure: ESOPHAGEAL BANDING;  Surgeon: Daneil Dolin, MD;  [facility];  Service: Endoscopy;;  . ESOPHAGEAL BANDING N/A 07/24/2014   Procedure: ESOPHAGEAL BANDING (2 bands applied);  Surgeon: Danie Binder, MD;  [facility]: [facility];  Service: Endoscopy;  Laterality: N/A;  . ESOPHAGEAL BANDING N/A 07/23/2015   Procedure: ESOPHAGEAL BANDING;  Surgeon: Danie Binder, MD;  [facility];  Service: Endoscopy;  Laterality: N/A;  . ESOPHAGEAL BANDING N/A 03/04/2016   Procedure: ESOPHAGEAL BANDING;  Surgeon: Danie Binder, MD;  [facility];  Service: Endoscopy;  Laterality: N/A;  . ESOPHAGOGASTRODUODENOSCOPY N/A 11/14/2013   SLF:1 column of very small varices in distal esopahgus/MODERATE PORTAL GASTROPATHY IN PROXIMAL STOMACH/MODERATE erosive gastritis  . ESOPHAGOGASTRODUODENOSCOPY N/A 02/11/2014   Dr. Rourk:Esophageal varices with bleeding stigmata-status post esophageal band ligation therapy. Portal gastropathy  . ESOPHAGOGASTRODUODENOSCOPY  N/A 07/04/2014   RMR: Persiting grade 2 esophageal varicies with bleeding stigmata status post band ligation. Significantly congested gastric mucosa iwith changes constistant with protal gastropathy.   . ESOPHAGOGASTRODUODENOSCOPY N/A 09/16/2014   Rehman: Single short column of varix proximal to GE junction not large enough to be banded. Two amall ulcers at the GEJ felt to be source of GI Bleeding but no active bleeding but no actibe bleeding noted. No therapy rendered. Portal gastropathy NO evidence of peptic ulcer diease or gastric varices.   . ESOPHAGOGASTRODUODENOSCOPY N/A 12/14/2014   SLF: Grade ! esophageal varices. 2. Moderate Portal Gastropathy  . ESOPHAGOGASTRODUODENOSCOPY N/A 03/27/2015   Procedure: ESOPHAGOGASTRODUODENOSCOPY (EGD);  Surgeon: Danie Binder, MD;  [facility];  Service: Endoscopy;  Laterality: N/A;  1045am - moved to 817 @ 11:30  . ESOPHAGOGASTRODUODENOSCOPY N/A 06/05/2015   Procedure: ESOPHAGOGASTRODUODENOSCOPY (EGD);  Surgeon: Clarene Essex, MD;  Location[facility] ENDOSCOPY;  Service: Endoscopy;  Laterality: N/A;  . ESOPHAGOGASTRODUODENOSCOPY N/A 07/23/2015   SLF:1. Grade 1 esophageal varices 2. Moderate portal hypertensive gastropathy 3. MILd non-erosive gastritis.   [name] ESOPHAGOGASTRODUODENOSCOPY N/A 03/04/2016   Dr. Oneida Alar: grade 1 varices, surveillance in Jan 2017   . ESOPHAGOGASTRODUODENOSCOPY (EGD) WITH PROPOFOL N/A 07/24/2014   SLF:  1. 2 columns grade 2-3 varices- 2 Bands applied.  2.  Moderate gastropathy 3. Duodenal Diverticula  . ESOPHAGOGASTRODUODENOSCOPY (EGD) WITH PROPOFOL N/A 10/26/2014   RMR: 2 columns of grade 2 esophageal varices without obvious bleeding stigmata status post band ligation to complet obliteration of remaining varices.   . None    . PARACENTESIS  Feb 2015   1180 fluid, negative fluid analysis.   [name] PARACENTESIS  10/2013     reports that she quit smoking about 7 years ago. Her smoking use included Cigarettes. She has a 5.00 pack-year  smoking history. She has never used smokeless tobacco. She reports that she does not drink alcohol or use drugs.  Allergies  Allergen Reactions  . Lasix [Furosemide]     "doesn't work"  . Latex Itching  . Morphine And Related Hives and Itching    Family History  Problem Relation Age of Onset  . Heart disease Mother   . Colon cancer Neg Hx   . Liver disease Neg Hx     Prior to Admission medications   Medication Sig Start Date End Date Taking? Authorizing Provider  clotrimazole (LOTRIMIN) 1 % cream APPLY TOPICALLY THREE TIMES DAILY FOR 10 DAYS 03/19/16  Yes Carlis Stable, NP  Darbepoetin Alfa (ARANESP) 60 MCG/0.3ML SOSY injection Inject 0.3 mLs (60 mcg total) into the vein every Thursday with hemodialysis. 06/09/15  Yes Cherene Altes, MD  lactulose ([place]) 10 GM/15ML solution TAKE 3 TEASPOONSFUL (15ML) BY MOUTH TWICE DAILY Patient taking differently: 7.5-15mls by mouth once daily 07/22/15  Yes Mahala Menghini, PA-C  lidocaine (XYLOCAINE) 2 % solution TAKE TWO TEASPOONSFUL (10ML) BY MOUTH BEFORE MEALS AND AT BEDTIME TO PREVENT CHEST PAIN WHILE EATING 03/19/16  Yes Carlis Stable, NP  lidocaine-prilocaine (EMLA) cream Apply 1 application topically as needed (for dialysis treatments).   Yes Historical Provider, MD  LORazepam (ATIVAN) 1 MG tablet TAKE 1/2 TO 1 TABLET  BY MOUTH TWICE DAILY AS NEEDED FOR BACK SPASM OR FOR ANXIETY 01/29/16  Yes Danie Binder, MD  midodrine (PROAMATINE) 10 MG tablet Take 1 tablet (10 mg total) by mouth daily. *Takes only on dialysis days on Tuesdays, Thursdays, and Saturdays (Sometimes takes treatments on Friday instead of Saturday)**Medication to keep your blood pressure from falling. Patient taking differently: Take 10 mg by mouth as directed. *Takes only on dialysis days on Tuesdays, Thursdays, and Saturdays (Sometimes takes treatments on Friday instead of Saturday)**Medication to keep your blood pressure from falling. 06/09/15  Yes Cherene Altes, MD    Oxycodone HCl 10 MG TABS 1 PO TID AS NEEDED FOR PAIN Patient taking differently: Take 10 mg by mouth 3 (three) times daily as needed.  02/05/16  Yes Danie Binder, MD  pantoprazole (PROTONIX) 40 MG tablet Take 1 tablet (40 mg total) by mouth 2 (two) times daily. Patient taking differently: Take 40 mg by mouth 2 (two) times daily as needed (acid reflux).  09/19/14  Yes Kathie Dike, MD  promethazine (PHENERGAN) 12.5 MG tablet TAKE ONE TABLET BY MOUTH EVERY 6 HOURS AS NEEDED FOR NAUSEA AND VOMITING. 09/06/15  Yes Mahala Menghini, PA-C  RENVELA 800 MG tablet 1 TABLET 3 TIMES A DAY WITH MEALS 10/29/15  Yes Historical Provider, MD  sucralfate (CARAFATE) 1 GM/10ML suspension Take 10 mLs (1 g total) by mouth 4 (four) times daily. Patient not taking: Reported on 04/28/2016 07/10/15   Danie Binder, MD    Physical Exam: Vitals:   04/28/16 2100 04/28/16 2103 04/28/16 2222 04/29/16 0012  BP: 121/56  111/55 113/56  Pulse: 107  101 96  Resp: 18  18 18   Temp: 98.1 F (36.7 C)  98 F (36.7 C)   TempSrc: Oral  Oral   SpO2: 100%  100% 99%  Weight:  68 kg (150 lb)    Height:  5\' 1"  (1.549 m)      Constitutional: NAD, calm, comfortable Vitals:   04/28/16 2100 04/28/16 2103 04/28/16 2222 04/29/16 0012  BP: 121/56  111/55 113/56  Pulse: 107  101 96  Resp: 18  18 18   Temp: 98.1 F (36.7 C)  98 F (36.7 C)   TempSrc: Oral  Oral   SpO2: 100%  100% 99%  Weight:  68 kg (150 lb)    Height:  5\' 1"  (1.549 m)     Eyes: PERRL, lids and conjunctivae normal ENMT: Mucous membranes are moist. Posterior pharynx clear of any exudate or lesions.Normal dentition.  Neck: normal, supple, no masses, no thyromegaly Respiratory: clear to auscultation bilaterally, no wheezing, no crackles. Normal respiratory effort. No accessory muscle use.  Cardiovascular: Regular rate and rhythm, no murmurs / rubs / gallops. No extremity edema. 2+ pedal pulses. No carotid bruits.  Abdomen: no tenderness, no masses palpated. No  hepatosplenomegaly. Bowel sounds positive.  She has moderate ascites by exam.  Musculoskeletal: no clubbing / cyanosis. No joint deformity upper and lower extremities. Good ROM, no contractures. Normal muscle tone.  Skin: no rashes, lesions, ulcers. No induration.  Right upper extremity AVF with thrill. Neurologic: CN 2-12 grossly intact. Sensation intact, DTR normal. Strength 5/5 in all 4.  Psychiatric: Normal judgment and insight. Alert and oriented x 3. Normal mood.   Labs on Admission: I have personally reviewed following labs and imaging studies  CBC:  Recent Labs Lab 04/28/16 2210  WBC 9.4  NEUTROABS 6.8  HGB 8.7*  HCT 26.7*  MCV 102.7*  PLT 132*  Basic Metabolic Panel:  Recent Labs Lab 04/28/16 2210  NA 131*  K 4.0  CL 94*  CO2 16*  GLUCOSE 91  BUN 120*  CREATININE 10.85*  CALCIUM 8.8*   GFR: Estimated Creatinine Clearance: 6 mL/min (by C-G formula based on SCr of 10.85 mg/dL (H)). Liver Function Tests:  Recent Labs Lab 04/28/16 2210  AST 55*  ALT 31  ALKPHOS 104  BILITOT 1.0  PROT 6.6  ALBUMIN 3.0*    Recent Labs Lab 04/28/16 2210  LIPASE 188*   Coagulation Profile:  Recent Labs Lab 04/28/16 2210  INR 1.11   Urine analysis:    Component Value Date/Time   COLORURINE YELLOW 01/02/2016 1200   APPEARANCEUR CLEAR 01/02/2016 1200   LABSPEC 1.025 01/02/2016 1200   PHURINE 5.5 01/02/2016 1200   GLUCOSEU NEGATIVE 01/02/2016 1200   HGBUR SMALL (A) 01/02/2016 1200   BILIRUBINUR NEGATIVE 01/02/2016 1200   KETONESUR NEGATIVE 01/02/2016 1200   PROTEINUR 30 (A) 01/02/2016 1200   UROBILINOGEN 0.2 08/13/2014 2138   NITRITE NEGATIVE 01/02/2016 1200   LEUKOCYTESUR NEGATIVE 01/02/2016 1200    Radiological Exams on Admission: No results found.   Assessment/Plan Principal Problem:   UGIB (upper gastrointestinal bleed) Active Problems:   Malnutrition of moderate degree (HCC)   Esophageal varices without bleeding (HCC)   Thrombocytopenia  (HCC)   Liver cirrhosis secondary to NASH    1. Upper GI bleed. RBC 2.6. Recheck RBC every 4 hours. Consult GI in the am. She is stable, and doesn't require transfusion.  Nevertheless, with UGIB, will admit her into the ICU.  Continue with PPI.  No indication for octreotide.  Will give clear liquid.  GI will follow up tomorrow.  2. Liver cirrhosis with moderate ascites:  Continue to monitor.  Will continue with diuresis.  She was scheduled to have LVP with IR in a day.  Have entered request for LVP.   3. Anemia. Due to her upper GI bleed. Hgb is low at 8.7. If Hgb continues to drop, may need transfusion. Recheck labs in the am and monitor closely. 4. ESRD needing HD:  Will consult nephrology for dialysis support tomorrow.  5. Thrombocytopenia. Platelets 132. Recheck CBC in the am 6. Hyponatremia. Low at 131. Start on sodium supplementation. Recheck BMP in the am. 7. Esophageal varices:  I don't think she has bleeding from her varices at this time.   8. Malnutrition of moderate degree. Noted  DVT prophylaxis: SCDs Code Status: Full Family Communication: Husband at bedside Disposition Plan: Discharge once improved Consults called: GI Admission status: Admit to inpatient   Orvan Falconer, MD FACP [facility] If 7PM-7AM, please contact night-coverage [email or web address] Password [email or web address] 04/29/2016, 12:29 AM   By signing my name below, I, Hilbert Odor, attest that this documentation has been prepared under the direction and in the presence of Orvan Falconer, MD. Electronically signed: Hilbert Odor, Scribe.  04/29/16, 12:55 AM

## 2016-04-29 NOTE — Progress Notes (Signed)
Patient was having hematemesis now. Vitals are still stable. Given active bleeding, will go ahead with transfusion first unit of PRBCs. Start PPI bolus and drip. Start Octreotide given known Varices.   GI has been consulted, and will see her this morning.   Give antiemetics.  Normal INR and platelet count noted.   Orvan Falconer MD FACP. Hospitalist.

## 2016-04-29 NOTE — Consult Note (Signed)
Reason for Consult:UGIB Referring Physician: Hospitalist  Wanda [name] is an 39 y.o. female.  HPI: Admitted thru the ED with UGIB. Hx of same. Has been having tarry stool since Saturday. Actively vomiting this am. Vomitus dark brown in color. Vomiting started at 6am today.  Hemoglobin this am 8.7. Her last banding was 03/04/2016: Dr. Fieids: Impression:               - Grade I esophageal varices.                           - Portal hypertensive gastropathy.                           - Normal examined duodenum.                           - NO GASTRIC OR DUODENAL VARICES Takes dialysis T-Thur-Friday. Significant other states she will receive dialysis today. She missed dialysis yesterday.  Has been on dialysis for about a year. Hx of NAFLD. Significant other states she  Has paracentesis about every week.  Has been evaluated at [place].  Has not been seen in a year.  Denies fever.  Past Medical History:  Diagnosis Date  . Acute blood loss anemia 02/25/2014   Status post transfusion  . Acute renal failure (HCC) 09/2013   Pre-renal- resolved  . Anasarca 10/10/2013  . Anxiety   . Bipolar disorder (HCC) 12/04/2013   2007-SEEN IN ED FOR INVOLUNTARY COMMITMENT, UDS POS FOR AMPHETAMINES/OPIATES   . Bleeding esophageal varices (HCC) 02/28/2014   s/p banding  . C. difficile colitis 04/19/2014  . Chronic hypotension   . Cirrhosis (HCC) 10/05/13   Liver bx 11/23/13 (delayed initially due to patient refusal). c/w steatohepatitis  . Cirrhosis of liver with ascites (HCC)   . Depression   . ESRD (end stage renal disease) on dialysis (HCC) 08/2014  . Folate deficiency 09/2013  . Gastroesophageal junction ulcer 09/17/2014  . GERD (gastroesophageal reflux disease)   . Hematemesis/vomiting blood 02/24/2014  . Macrocytosis 02/28/2014  . PNA (pneumonia) 10/13/2013  . SBP (spontaneous bacterial peritonitis) (HCC) 11/10/2013  . Thrombocytopenia (HCC)    Hypercellular bone marrow; abundant megakaryocytes per 08/27/2014;  s/p bone marrow bx    Past Surgical History:  Procedure Laterality Date  . AV FISTULA PLACEMENT Right 11/16/2014   Procedure: Right arm Creation of arteriovenous fistula;  Surgeon: Christopher S Dickson, MD;  [facility];  Service: Vascular;  Laterality: Right;  . CENTRAL VENOUS CATHETER INSERTION Right   . COLONOSCOPY N/A 12/19/2013   SLF:NO OBVIOUS SOURCE FOR ANEMIA IDETIFIED/ONE COLON POLYP REMOVED/Small internal hemorrhoids  . ESOPHAGEAL BANDING  07/04/2014   Procedure: ESOPHAGEAL BANDING;  Surgeon: Robert M Rourk, MD;  [facility];  Service: Endoscopy;;  . ESOPHAGEAL BANDING N/A 07/24/2014   Procedure: ESOPHAGEAL BANDING (2 bands applied);  Surgeon: Sandi L Fields, MD;  [facility];  Service: Endoscopy;  Laterality: N/A;  . ESOPHAGEAL BANDING N/A 07/23/2015   Procedure: ESOPHAGEAL BANDING;  Surgeon: Sandi L Fields, MD;  [facility];  Service: Endoscopy;  Laterality: N/A;  . ESOPHAGEAL BANDING N/A 03/04/2016   Procedure: ESOPHAGEAL BANDING;  Surgeon: Sandi L Fields, MD;  [facility];  Service: Endoscopy;  Laterality: N/A;  . ESOPHAGOGASTRODUODENOSCOPY N/A 11/14/2013   SLF:1 column of very small varices in distal   esopahgus/MODERATE PORTAL GASTROPATHY IN PROXIMAL STOMACH/MODERATE erosive gastritis  . ESOPHAGOGASTRODUODENOSCOPY N/A 02/11/2014   Dr. Rourk:Esophageal varices with bleeding stigmata-status post esophageal band ligation therapy. Portal gastropathy  . ESOPHAGOGASTRODUODENOSCOPY N/A 07/04/2014   RMR: Persiting grade 2 esophageal varicies with bleeding stigmata status post band ligation. Significantly congested gastric mucosa iwith changes constistant with protal gastropathy.   . ESOPHAGOGASTRODUODENOSCOPY N/A 09/16/2014   Rehman: Single short column of varix proximal to GE junction not large enough to be banded. Two amall ulcers at the GEJ felt to be source of GI Bleeding but no active bleeding but no actibe bleeding noted. No therapy rendered.  Portal gastropathy NO evidence of peptic ulcer diease or gastric varices.   . ESOPHAGOGASTRODUODENOSCOPY N/A 12/14/2014   SLF: Grade ! esophageal varices. 2. Moderate Portal Gastropathy  . ESOPHAGOGASTRODUODENOSCOPY N/A 03/27/2015   Procedure: ESOPHAGOGASTRODUODENOSCOPY (EGD);  Surgeon: Sandi L Fields, MD;  [facility];  Service: Endoscopy;  Laterality: N/A;  1045am - moved to 817 @ 11:30  . ESOPHAGOGASTRODUODENOSCOPY N/A 06/05/2015   Procedure: ESOPHAGOGASTRODUODENOSCOPY (EGD);  Surgeon: Marc Magod, MD;  Location: [facility];  Service: Endoscopy;  Laterality: N/A;  . ESOPHAGOGASTRODUODENOSCOPY N/A 07/23/2015   SLF:1. Grade 1 esophageal varices 2. Moderate portal hypertensive gastropathy 3. MILd non-erosive gastritis.   . ESOPHAGOGASTRODUODENOSCOPY N/A 03/04/2016   Dr. Fields: grade 1 varices, surveillance in Jan 2017   . ESOPHAGOGASTRODUODENOSCOPY (EGD) WITH PROPOFOL N/A 07/24/2014   SLF:  1. 2 columns grade 2-3 varices- 2 Bands applied.  2.  Moderate gastropathy 3. Duodenal Diverticula  . ESOPHAGOGASTRODUODENOSCOPY (EGD) WITH PROPOFOL N/A 10/26/2014   RMR: 2 columns of grade 2 esophageal varices without obvious bleeding stigmata status post band ligation to complet obliteration of remaining varices.   . None    . PARACENTESIS  Feb 2015   1180 fluid, negative fluid analysis.   . PARACENTESIS  10/2013    Family History  Problem Relation Age of Onset  . Heart disease Mother   . Colon cancer Neg Hx   . Liver disease Neg Hx     Social History:  reports that she quit smoking about 7 years ago. Her smoking use included Cigarettes. She has a 5.00 pack-year smoking history. She has never used smokeless tobacco. She reports that she does not drink alcohol or use drugs.  Allergies:  Allergies  Allergen Reactions  . Lasix [Furosemide]     "doesn't work"  . Latex Itching  . Morphine And Related Hives and Itching    Medications: I have reviewed the patient's current  medications.  Results for orders placed or performed during the hospital encounter of 04/28/16 (from the past 48 hour(s))  Comprehensive metabolic panel     Status: Abnormal   Collection Time: 04/28/16 10:10 PM  Result Value Ref Range   Sodium 131 (L) 135 - 145 mmol/L   Potassium 4.0 3.5 - 5.1 mmol/L   Chloride 94 (L) 101 - 111 mmol/L   CO2 16 (L) 22 - 32 mmol/L   Glucose, Bld 91 65 - 99 mg/dL   BUN 120 (H) 6 - 20 mg/dL    Comment: RESULTS CONFIRMED BY MANUAL DILUTION   Creatinine, Ser 10.85 (H) 0.44 - 1.00 mg/dL   Calcium 8.8 (L) 8.9 - 10.3 mg/dL   Total Protein 6.6 6.5 - 8.1 g/dL   Albumin 3.0 (L) 3.5 - 5.0 g/dL   AST 55 (H) 15 - 41 U/L   ALT 31 14 - 54 U/L   Alkaline Phosphatase 104 38 -   126 U/L   Total Bilirubin 1.0 0.3 - 1.2 mg/dL   GFR calc non Af Amer 4 (L) >60 mL/min   GFR calc Af Amer 4 (L) >60 mL/min    Comment: (NOTE) The eGFR has been calculated using the CKD EPI equation. This calculation has not been validated in all clinical situations. eGFR's persistently <60 mL/min signify possible Chronic Kidney Disease.    Anion gap 21 (H) 5 - 15  CBC WITH DIFFERENTIAL     Status: Abnormal   Collection Time: 04/28/16 10:10 PM  Result Value Ref Range   WBC 9.4 4.0 - 10.5 K/uL   RBC 2.60 (L) 3.87 - 5.11 MIL/uL   Hemoglobin 8.7 (L) 12.0 - 15.0 g/dL   HCT 26.7 (L) 36.0 - 46.0 %   MCV 102.7 (H) 78.0 - 100.0 fL   MCH 33.5 26.0 - 34.0 pg   MCHC 32.6 30.0 - 36.0 g/dL   RDW 15.5 11.5 - 15.5 %   Platelets 132 (L) 150 - 400 K/uL   Neutrophils Relative % 71 %   Neutro Abs 6.8 1.7 - 7.7 K/uL   Lymphocytes Relative 16 %   Lymphs Abs 1.5 0.7 - 4.0 K/uL   Monocytes Relative 7 %   Monocytes Absolute 0.6 0.1 - 1.0 K/uL   Eosinophils Relative 5 %   Eosinophils Absolute 0.4 0.0 - 0.7 K/uL   Basophils Relative 1 %   Basophils Absolute 0.1 0.0 - 0.1 K/uL  Lipase, blood     Status: Abnormal   Collection Time: 04/28/16 10:10 PM  Result Value Ref Range   Lipase 188 (H) 11 - 51 U/L   APTT     Status: None   Collection Time: 04/28/16 10:10 PM  Result Value Ref Range   aPTT 31 24 - 36 seconds  Protime-INR     Status: None   Collection Time: 04/28/16 10:10 PM  Result Value Ref Range   Prothrombin Time 14.4 11.4 - 15.2 seconds   INR 1.11   Type and screen     Status: None   Collection Time: 04/28/16 11:27 PM  Result Value Ref Range   ABO/RH(D) A POS    Antibody Screen NEG    Sample Expiration 05/01/2016   POC occult blood, ED     Status: None   Collection Time: 04/28/16 11:38 PM  Result Value Ref Range   Fecal Occult Bld NEGATIVE NEGATIVE  Urinalysis, Routine w reflex microscopic (not at [facility])     Status: None   Collection Time: 04/29/16 12:25 AM  Result Value Ref Range   Color, Urine YELLOW YELLOW   APPearance CLEAR CLEAR   Specific Gravity, Urine 1.020 1.005 - 1.030   pH 5.5 5.0 - 8.0   Glucose, UA NEGATIVE NEGATIVE mg/dL   Hgb urine dipstick NEGATIVE NEGATIVE   Bilirubin Urine NEGATIVE NEGATIVE   Ketones, ur NEGATIVE NEGATIVE mg/dL   Protein, ur NEGATIVE NEGATIVE mg/dL   Nitrite NEGATIVE NEGATIVE   Leukocytes, UA NEGATIVE NEGATIVE    Comment: MICROSCOPIC NOT DONE ON URINES WITH NEGATIVE PROTEIN, BLOOD, LEUKOCYTES, NITRITE, OR GLUCOSE <1000 mg/dL.  MRSA PCR Screening     Status: None   Collection Time: 04/29/16  2:30 AM  Result Value Ref Range   MRSA by PCR NEGATIVE NEGATIVE    Comment:        The GeneXpert MRSA Assay (FDA approved for NASAL specimens only), is one component of a comprehensive MRSA colonization surveillance program. It is not intended to diagnose MRSA  infection nor to guide or monitor treatment for MRSA infections.   CBC     Status: Abnormal   Collection Time: 04/29/16  3:04 AM  Result Value Ref Range   WBC 7.0 4.0 - 10.5 K/uL   RBC 2.55 (L) 3.87 - 5.11 MIL/uL   Hemoglobin 8.7 (L) 12.0 - 15.0 g/dL   HCT 26.6 (L) 36.0 - 46.0 %   MCV 104.3 (H) 78.0 - 100.0 fL   MCH 34.1 (H) 26.0 - 34.0 pg   MCHC 32.7 30.0 - 36.0 g/dL   RDW  14.6 11.5 - 15.5 %   Platelets 110 (L) 150 - 400 K/uL    Comment: REPEATED TO VERIFY SPECIMEN CHECKED FOR CLOTS CONSISTENT WITH PREVIOUS RESULT     No results found.  ROS Blood pressure (!) 83/43, pulse 93, temperature (!) 96.7 F (35.9 C), temperature source Oral, resp. rate 15, height 5' 1" (1.549 m), weight 139 lb 5.3 oz (63.2 kg), last menstrual period 09/19/2013, SpO2 100 %. Physical ExamAlert, nauseated at this time. Lungs are clear. HR regular. Abdomen is distended. Umbilicus protruding. 2-3+ edema to lower extremities. Dressing to coccyx area.  Dialysis shunt to rt upper arm.   Assessment/Plan: UGIB. Will discuss with Dr. Laural Golden. Last EGD was in July.  Possible gastroparesis: Reglan 27m IV q 6 hrs. NPO. Discussed with Dr. RLaural Golden  Judd Mccubbin W 04/29/2016, 7:51 AM

## 2016-04-29 NOTE — Consult Note (Addendum)
Wanda [name] MRN: [id number] DOB/AGE: [birth year] 42 y.o. Primary Care Physician:ROBERSON, [name], MD Admit date: 04/28/2016 Chief Complaint:  Chief Complaint  Patient presents with  . Rectal Bleeding   HPI: Pt is 40 year old caucasian female with past medical hx of ESRD who came to ER with c/o rectal bleeding.  HPI dates back to 2-3 days as pt has having black stools. Pt husband was present in the room and he said " it was black like tar"   Pt gives no hx of recent paracentesis. Pt offers No fevers.  Pt  c/o  Nausea and vomiting NO c/o diarrhea. NO c/o hematemesis NO c/o syncope. Pt is on dialysis Tues/Thurs/Sat schedule but, she did not get dialysis yesterday . Pt is currently admitted for treatment of her Up GI bleed   Past Medical History:  Diagnosis Date  . Acute blood loss anemia 02/25/2014   Status post transfusion  . Acute renal failure ([place]) 09/2013   Pre-renal- resolved  . Anasarca 10/10/2013  . Anxiety   . Bipolar disorder ([place]) 12/04/2013   2007-SEEN IN ED FOR INVOLUNTARY COMMITMENT, UDS POS FOR AMPHETAMINES/OPIATES   . Bleeding esophageal varices ([place]) 02/28/2014   s/p banding  . C. difficile colitis 04/19/2014  . Chronic hypotension   . Cirrhosis ([place]) 10/05/13   Liver bx 11/23/13 (delayed initially due to patient refusal). c/w steatohepatitis  . Cirrhosis of liver with ascites ([place])   . Depression   . ESRD (end stage renal disease) on dialysis ([place]) 08/2014  . Folate deficiency 09/2013  . Gastroesophageal junction ulcer 09/17/2014  . GERD (gastroesophageal reflux disease)   . Hematemesis/vomiting blood 02/24/2014  . Macrocytosis 02/28/2014  . PNA (pneumonia) 10/13/2013  . SBP (spontaneous bacterial peritonitis) ([place]) 11/10/2013  . Thrombocytopenia ([place])    Hypercellular bone marrow; abundant megakaryocytes per 08/27/2014; s/p bone marrow bx        Family History  Problem Relation Age of Onset  . Heart disease Mother   . Colon cancer Neg Hx   . Liver disease  Neg Hx     Social History:  reports that she quit smoking about 7 years ago. Her smoking use included Cigarettes. She has a 5.00 pack-year smoking history. She has never used smokeless tobacco. She reports that she does not drink alcohol or use drugs.   Allergies:  Allergies  Allergen Reactions  . Lasix [Furosemide]     "doesn't work"  . Latex Itching  . Morphine And Related Hives and Itching    Medications Prior to Admission  Medication Sig Dispense Refill  . clotrimazole (LOTRIMIN) 1 % cream APPLY TOPICALLY THREE TIMES DAILY FOR 10 DAYS 60 g 1  . Darbepoetin Alfa (ARANESP) 60 MCG/0.3ML SOSY injection Inject 0.3 mLs (60 mcg total) into the vein every Thursday with hemodialysis. 4.2 mL   . lactulose (CHRONULAC) 10 GM/15ML solution TAKE 3 TEASPOONSFUL (15ML) BY MOUTH TWICE DAILY (Patient taking differently: 7.5-15mls by mouth once daily) 946 mL 5  . lidocaine (XYLOCAINE) 2 % solution TAKE TWO TEASPOONSFUL (10ML) BY MOUTH BEFORE MEALS AND AT BEDTIME TO PREVENT CHEST PAIN WHILE EATING 500 mL 1  . lidocaine-prilocaine (EMLA) cream Apply 1 application topically as needed (for dialysis treatments).    . LORazepam (ATIVAN) 1 MG tablet TAKE 1/2 TO 1 TABLET BY MOUTH TWICE DAILY AS NEEDED FOR BACK SPASM OR FOR ANXIETY 60 tablet 3  . midodrine (PROAMATINE) 10 MG tablet Take 1 tablet (10 mg total) by mouth daily. *Takes only on dialysis days  on Tuesdays, Thursdays, and Saturdays (Sometimes takes treatments on Friday instead of Saturday)**Medication to keep your blood pressure from falling. (Patient taking differently: Take 10 mg by mouth as directed. *Takes only on dialysis days on Tuesdays, Thursdays, and Saturdays (Sometimes takes treatments on Friday instead of Saturday)**Medication to keep your blood pressure from falling.) 60 tablet 3  . Oxycodone HCl 10 MG TABS 1 PO TID AS NEEDED FOR PAIN (Patient taking differently: Take 10 mg by mouth 3 (three) times daily as needed. ) 90 tablet 0  .  pantoprazole (PROTONIX) 40 MG tablet Take 1 tablet (40 mg total) by mouth 2 (two) times daily. (Patient taking differently: Take 40 mg by mouth 2 (two) times daily as needed (acid reflux). ) 60 tablet 2  . promethazine (PHENERGAN) 12.5 MG tablet TAKE ONE TABLET BY MOUTH EVERY 6 HOURS AS NEEDED FOR NAUSEA AND VOMITING. 30 tablet 0  . RENVELA 800 MG tablet 1 TABLET 3 TIMES A DAY WITH MEALS  5  . sucralfate (CARAFATE) 1 GM/10ML suspension Take 10 mLs (1 g total) by mouth 4 (four) times daily. (Patient not taking: Reported on 04/28/2016) 420 mL 1       [id number] from the symptoms mentioned above,there are no other symptoms referable to all systems reviewed.  . sodium chloride   Intravenous Once  . sodium chloride   Intravenous Once  . cefTRIAXone (ROCEPHIN)  IV  2 g Intravenous Q24H  . epoetin (EPOGEN/PROCRIT) injection  10,000 Units Subcutaneous Once  . lactulose  10 g Oral Daily  . metoCLOPramide  5 mg Oral TID AC & HS  . midodrine  10 mg Oral UD  . [START ON 05/02/2016] pantoprazole  40 mg Intravenous Q12H  . sevelamer carbonate  800 mg Oral TID WC     Physical Exam: Vital signs in last 24 hours: Temp:  [96.7 F (35.9 C)-98.3 F (36.8 C)] 97 F (36.1 C) (09/20 0752) Pulse Rate:  [90-107] 93 (09/20 0700) Resp:  [15-31] 15 (09/20 0700) BP: (83-126)/(38-62) 83/43 (09/20 0700) SpO2:  [98 %-100 %] 100 % (09/20 0700) Weight:  [139 lb 5.3 oz (63.2 kg)-150 lb (68 kg)] 139 lb 5.3 oz (63.2 kg) (09/20 0500) Weight change:  Last BM Date: 04/28/16  Intake/Output from previous day: 09/19 0701 - 09/20 0700 In: 360 [P.O.:360] Out: 10 [Urine:10] No intake/output data recorded.   Physical Exam: General- pt is awake,alert, oriented to time place and person Resp- No acute REsp distress, decreased at bases CVS- S1S2 regular in rate and rhythm GIT- BS+, soft, umbilical hernia, tenderness over erythema site EXT- 2+ LE Edema,NO Cyanosis CNS- CN 2-12 grossly intact. Moving all 4  extremities Psych- normal mood and affect Access-  AVF   Lab Results: CBC  Recent Labs  04/29/16 0304 04/29/16 0834  WBC 7.0 7.4  HGB 8.7* 6.4*  HCT 26.6* 20.0*  PLT 110* 92*    BMET  Recent Labs  04/28/16 2210  NA 131*  K 4.0  CL 94*  CO2 16*  GLUCOSE 91  BUN 120*  CREATININE 10.85*  CALCIUM 8.8*    MICRO Recent Results (from the past 240 hour(s))  MRSA PCR Screening     Status: None   Collection Time: 04/29/16  2:30 AM  Result Value Ref Range Status   MRSA by PCR NEGATIVE NEGATIVE Final    Comment:        The GeneXpert MRSA Assay (FDA approved for NASAL specimens only), is one component of a comprehensive MRSA colonization  surveillance program. It is not intended to diagnose MRSA infection nor to guide or monitor treatment for MRSA infections.       Lab Results  Component Value Date   CALCIUM 8.8 (L) 04/28/2016   CAION 1.10 (L) 05/14/2015   PHOS 5.0 (H) 06/08/2015      Impression: 1)RenalESRD  H f started on  08/18/14  Pt is on Tue/Thurs/Saturday schedule  Pt missed her Hd yesterday, Will dialyze today                Pt does have hx of non adherence to dialysis               2)CVS- Hemodynamically fragile  BP low  On Midodrine   3)Anemia IN ESRD the goal for HGb is  9--11 Pt hgb is not at goal  Will keep on  epo Admitted with GI bleed -Hgb trending dwon   4)Liver- Cirrhosis Sec to Steato hepatitis Primary team following  5)Electrolytes Normokalemia  Hyponatremia  Cirrhosis + ESRD   6)ID-admitted with UGI bleed On IV AbX as recommended by Pul Critical care   7)Acid base Co2 at goal  8) Thrombocytopenia-Stable Primary MD following    Plan:  Will dialzye today Will use 2 k bath Will not give any heparin  Will keep on epo-increase dose to 10k Will give albumin for hypotension during hd( pt bp already on lower side) Pt need of PRBC being  closely monitored by primary team  Pt will most likely need hd again in am as Thursday is her scheduled day.     BHUTANI,MANPREET S 04/29/2016, 9:34 AM

## 2016-04-29 NOTE — Progress Notes (Signed)
Patient seen and examined. Discussed with significant other at bedside. Admitted overnight with melena. Has developed hematemesis while in the hospital. Is being transfused 1 unit, will add a second unit of PRBCs. GI has been consulted. Will likely need EGD. Continue PPI and octreotide for now.  Domingo Mend, MD [facility] Pager: [phone]

## 2016-04-29 NOTE — Progress Notes (Signed)
Dr. Marin Comment in to see patient discussed plan of care and new orders with the patient and possibility of endoscopy.

## 2016-04-29 NOTE — Progress Notes (Signed)
Dr Jerilee Hoh updated on hemoccult +BM from pt's medium soft blackish stool

## 2016-04-29 NOTE — Progress Notes (Signed)
eLink Physician-Brief Progress Note Patient Name: Wanda [name] DOB: 10-15-1975 MRN: [id number]   Date of Service  04/29/2016  HPI/Events of Note  40 yo with multiple medical issues with ESRD, cirrhosis with GIB, VS stable, BUN elevated, hgb 8.7  eICU Interventions  Trend CBC GI consult recommended No further orders at this time, on PPI, recommend empiric abx to decrease Mortality in setting of GIB     Intervention Category Evaluation Type: New Patient Evaluation  Terris Bodin 04/29/2016, 4:55 AM

## 2016-04-30 ENCOUNTER — Inpatient Hospital Stay (HOSPITAL_COMMUNITY): Payer: Medicare Other | Admitting: Anesthesiology

## 2016-04-30 ENCOUNTER — Encounter (HOSPITAL_COMMUNITY)
Admission: EM | Disposition: A | Discharge status: Home / Self Care | Payer: Self-pay | Source: Home / Self Care | Attending: Internal Medicine

## 2016-04-30 ENCOUNTER — Inpatient Hospital Stay (HOSPITAL_COMMUNITY): Payer: Medicare Other

## 2016-04-30 ENCOUNTER — Encounter (HOSPITAL_COMMUNITY): Payer: Self-pay | Admitting: Anesthesiology

## 2016-04-30 ENCOUNTER — Inpatient Hospital Stay (HOSPITAL_COMMUNITY): Admit: 2016-04-30 | Payer: Medicare Other

## 2016-04-30 DIAGNOSIS — R0989 Other specified symptoms and signs involving the circulatory and respiratory systems: Secondary | ICD-10-CM

## 2016-04-30 DIAGNOSIS — R188 Other ascites: Secondary | ICD-10-CM

## 2016-04-30 DIAGNOSIS — K922 Gastrointestinal hemorrhage, unspecified: Secondary | ICD-10-CM

## 2016-04-30 DIAGNOSIS — K921 Melena: Secondary | ICD-10-CM

## 2016-04-30 DIAGNOSIS — K7581 Nonalcoholic steatohepatitis (NASH): Secondary | ICD-10-CM

## 2016-04-30 HISTORY — PX: ESOPHAGOGASTRODUODENOSCOPY (EGD) WITH PROPOFOL: SHX5813

## 2016-04-30 HISTORY — PX: ESOPHAGEAL BANDING: SHX5518

## 2016-04-30 LAB — BASIC METABOLIC PANEL
ANION GAP: 12 (ref 5–15)
BUN: 78 mg/dL — ABNORMAL HIGH (ref 6–20)
CHLORIDE: 107 mmol/L (ref 101–111)
CO2: 21 mmol/L — AB (ref 22–32)
Calcium: 7.9 mg/dL — ABNORMAL LOW (ref 8.9–10.3)
Creatinine, Ser: 5.36 mg/dL — ABNORMAL HIGH (ref 0.44–1.00)
GFR calc Af Amer: 11 mL/min — ABNORMAL LOW (ref >60)
GFR calc non Af Amer: 9 mL/min — ABNORMAL LOW (ref >60)
GLUCOSE: 109 mg/dL — AB (ref 65–99)
POTASSIUM: 3.9 mmol/L (ref 3.5–5.1)
Sodium: 140 mmol/L (ref 135–145)

## 2016-04-30 LAB — HEMOGLOBIN AND HEMATOCRIT, BLOOD
HEMATOCRIT: 29.5 % — AB (ref 36.0–46.0)
Hemoglobin: 9.7 g/dL — ABNORMAL LOW (ref 12.0–15.0)

## 2016-04-30 LAB — CBC
HEMATOCRIT: 23.3 % — AB (ref 36.0–46.0)
Hemoglobin: 7.6 g/dL — ABNORMAL LOW (ref 12.0–15.0)
MCH: 32.1 pg (ref 26.0–34.0)
MCHC: 32.6 g/dL (ref 30.0–36.0)
MCV: 98.3 fL (ref 78.0–100.0)
Platelets: 80 10*3/uL — ABNORMAL LOW (ref 150–400)
RBC: 2.37 MIL/uL — AB (ref 3.87–5.11)
RDW: 19.5 % — ABNORMAL HIGH (ref 11.5–15.5)
WBC: 5.4 10*3/uL (ref 4.0–10.5)

## 2016-04-30 LAB — HEPATITIS B SURFACE ANTIGEN: Hepatitis B Surface Ag: NEGATIVE

## 2016-04-30 SURGERY — ESOPHAGOGASTRODUODENOSCOPY (EGD) WITH PROPOFOL
Anesthesia: Monitor Anesthesia Care

## 2016-04-30 MED ORDER — SODIUM CHLORIDE 0.9 % IV SOLN: 100.0000 mL | INTRAVENOUS | Status: DC | PRN

## 2016-04-30 MED ORDER — MIDODRINE HCL 5 MG PO TABS
10.0000 mg | ORAL_TABLET | Freq: Three times a day (TID) | ORAL | Status: DC
  Administered 2016-04-30 – 2016-05-02 (×7): 10 mg via ORAL
  Filled 2016-04-30 (×10): qty 2

## 2016-04-30 MED ORDER — DIPHENHYDRAMINE HCL 25 MG PO CAPS: 25.0000 mg | ORAL_CAPSULE | Freq: Once | ORAL | Status: DC

## 2016-04-30 MED ORDER — PROPOFOL 10 MG/ML IV BOLUS
INTRAVENOUS | Status: DC | PRN
  Administered 2016-04-30 (×2): 10 mg via INTRAVENOUS
  Administered 2016-04-30: 30 mg via INTRAVENOUS
  Administered 2016-04-30: 10 mg via INTRAVENOUS

## 2016-04-30 MED ORDER — PANTOPRAZOLE SODIUM 40 MG PO TBEC
40.0000 mg | DELAYED_RELEASE_TABLET | Freq: Two times a day (BID) | ORAL | Status: DC
  Administered 2016-04-30 – 2016-05-02 (×5): 40 mg via ORAL
  Filled 2016-04-30 (×6): qty 1

## 2016-04-30 MED ORDER — OCTREOTIDE ACETATE 500 MCG/ML IJ SOLN
INTRAMUSCULAR | Status: AC
Start: 2016-04-30 — End: 2016-04-30
  Filled 2016-04-30: qty 1

## 2016-04-30 MED ORDER — ACETAMINOPHEN 325 MG PO TABS
650.0000 mg | ORAL_TABLET | Freq: Once | ORAL | Status: DC
  Filled 2016-04-30: qty 2

## 2016-04-30 MED ORDER — MIDODRINE HCL 5 MG PO TABS
10.0000 mg | ORAL_TABLET | Freq: Once | ORAL | Status: AC
  Administered 2016-04-30: 10 mg via ORAL
  Filled 2016-04-30: qty 2

## 2016-04-30 MED ORDER — ALBUMIN HUMAN 25 % IV SOLN
12.5000 g | Freq: Once | INTRAVENOUS | Status: DC
  Filled 2016-04-30: qty 50

## 2016-04-30 MED ORDER — ONDANSETRON HCL 4 MG/2ML IJ SOLN
INTRAMUSCULAR | Status: DC | PRN
  Administered 2016-04-30: 4 mg via INTRAVENOUS

## 2016-04-30 MED ORDER — ONDANSETRON HCL 4 MG/2ML IJ SOLN
4.0000 mg | Freq: Four times a day (QID) | INTRAMUSCULAR | Status: DC
  Administered 2016-04-30 – 2016-05-01 (×4): 4 mg via INTRAVENOUS
  Filled 2016-04-30 (×4): qty 2

## 2016-04-30 MED ORDER — SODIUM CHLORIDE 0.9 % IV BOLUS (SEPSIS)
250.0000 mL | Freq: Once | INTRAVENOUS | Status: AC
  Administered 2016-04-30: 250 mL via INTRAVENOUS

## 2016-04-30 MED ORDER — METOCLOPRAMIDE HCL 5 MG/ML IJ SOLN
5.0000 mg | Freq: Four times a day (QID) | INTRAMUSCULAR | Status: DC
  Administered 2016-04-30 – 2016-05-01 (×4): 5 mg via INTRAVENOUS
  Filled 2016-04-30 (×4): qty 2
  Filled 2016-04-30: qty 1

## 2016-04-30 MED ORDER — PROPOFOL 10 MG/ML IV BOLUS
INTRAVENOUS | Status: AC
  Filled 2016-04-30: qty 40

## 2016-04-30 MED ORDER — SODIUM CHLORIDE 0.9 % IV SOLN: Freq: Once | INTRAVENOUS | Status: AC

## 2016-04-30 MED ORDER — SUCRALFATE 1 GM/10ML PO SUSP
1.0000 g | Freq: Three times a day (TID) | ORAL | Status: DC
Start: 2016-04-30 — End: 2016-05-01
  Administered 2016-04-30 – 2016-05-01 (×3): 1 g via ORAL
  Filled 2016-04-30 (×4): qty 10

## 2016-04-30 MED ORDER — PROPOFOL 500 MG/50ML IV EMUL
INTRAVENOUS | Status: DC | PRN
  Administered 2016-04-30: 75 ug/kg/min via INTRAVENOUS

## 2016-04-30 NOTE — Op Note (Signed)
[facility] Patient Name: Wanda [name] Procedure Date: 04/30/2016 12:20 PM MRN: [id number] Date of Birth: Jun 05, 1976 Attending MD: Norvel Richards , MD CSN: [id number] Age: 40 Admit Type: Inpatient Procedure:                Upper GI endoscopy - diagnostic Indications:              Melena Providers:                Norvel Richards, MD, Rosina Lowenstein, RN, Randa Spike, Technician Referring MD:             Hospitalist Medicines:                Propofol per Anesthesia Complications:            No immediate complications. Estimated Blood Loss:     Estimated blood loss: none. Procedure:                Pre-Anesthesia Assessment:                           - Prior to the procedure, a History and Physical                            was performed, and patient medications and                            allergies were reviewed. The patient's tolerance of                            previous anesthesia was also reviewed. The risks                            and benefits of the procedure and the sedation                            options and risks were discussed with the patient.                            All questions were answered, and informed consent                            was obtained. Prior Anticoagulants: The patient has                            taken no previous anticoagulant or antiplatelet                            agents. ASA Grade Assessment: III - A patient with                            severe systemic disease. After reviewing the risks  and benefits, the patient was deemed in                            satisfactory condition to undergo the procedure.                           After obtaining informed consent, the endoscope was                            passed under direct vision. Throughout the                            procedure, the patient's blood pressure, pulse, and                            oxygen  saturations were monitored continuously. The                            [id number]) was introduced through the                            mouth, and advanced to the duodenal bulb. The upper                            GI endoscopy was accomplished without difficulty.                            The patient tolerated the procedure well. Scope In: 1:01:30 PM Scope Out: 1:06:38 PM Total Procedure Duration: 0 hours 5 minutes 8 seconds  Findings:      Scar distal esophageal mucosa. No obvious varices. No bleeding lesions       seen. Just below the GE junction there was a 1 cm rent in the mucosa       with overlying adherent clot. Please see photos. The stomach was empty.       Portal gastropathy present with significant mucosal congestion and       friability. No ulcer or gastric varices seen. Small hiatal hernia.       Patent pylorus. Normal-appearing first and second portion of the       duodenum.      . Impression:               - Mallory-Weiss tear without active bleeding                            currently. Esophageal mucosa scars present ?"varices                            pretty well obliterated. Hiatal hernia. Congested                            portal gastropathy                           - No specimens collected. Moderate Sedation:      Moderate (conscious) sedation was personally administered by an  anesthesia professional. The following parameters were monitored: oxygen       saturation, heart rate, blood pressure, respiratory rate, EKG, adequacy       of pulmonary ventilation, and response to care. Total physician       intraservice time was 18 minutes. Recommendation:           - Return patient to ICU for ongoing care. Clear                            liquid diet. Continue antibiotics. May stop                            octreotide tomorrow morning. Change PPI therapy to                            40 mg of Protonix twice daily. Change Reglan to 5                             mg IV every 6 hours to suppress heaving. Use Zofran                            when necessary. Discontinue Phenergan. Carafate                            suspension 1 g 4 times a day                           - Clear liquid diet.                           - Continue present medications. Procedure Code(s):        --- Professional ---                           [id number], Esophagogastroduodenoscopy, flexible,                            transoral; diagnostic, including collection of                            specimen(s) by brushing or washing, when performed                            (separate procedure) Diagnosis Code(s):        --- Professional ---                           K92.1, Melena (includes Hematochezia) CPT copyright 2016 American Medical Association. All rights reserved. The codes documented in this report are preliminary and upon coder review may  be revised to meet current compliance requirements. Cristopher Estimable. Mariacristina Aday, MD Norvel Richards, MD 04/30/2016 1:37:02 PM This report has been signed electronically. Number of Addenda: 0

## 2016-04-30 NOTE — Progress Notes (Signed)
PROGRESS NOTE    Wanda [name]  [name] DOB: 04/01/1976 DOA: 04/28/2016 PCP: Wanda Simmer, MD     Brief Narrative:  40 y/o woman admitted on 9/19 with c/o melena. Has a h/o NASH, ESRD on HD, GERD. Also has a h/o varices that have been banded in the past. She also gets frequent paracentesis for her abdominal ascites. While in the hospital developed hematemesis. Has been transfused 2 units of PRBCs. Will need repeat transfusion 9/21.   Assessment & Plan:   Principal Problem:   UGIB (upper gastrointestinal bleed) Active Problems:   Malnutrition of moderate degree (HCC)   Esophageal varices without bleeding (HCC)   Thrombocytopenia (H[facility])   Liver cirrhosis secondary to NASH   Rhonchi   UGIB -Bleeding varices vs MWT seem most likely. -Continue IV protonix, octreotide. -Plan for EGD today. -Appreciate GI input and recommendations.  Acute Blood Loss Anemia -On top of anemia of chronic disease. -Hb dropped to 6.4 on 9/20 and increased to 8.7 after transfusion of 2 units of PRBCs. -On 9/21, Hb has further decreased to 7.6 and will receive another unit of PRBCs as ordered by GI.  ESRD -Followed by nephrology. -Will receive HD today. -BP has been low, likely a combination of acute blood loss from GIB on top of chronic hypotension from cirrhosis.  NASH -Stable, see no need for paracentesis today.  Thrombocytopenia -Due to cirrhosis.     DVT prophylaxis: SCDs Code Status: Full Code Family Communication: significant other at bedside Disposition Plan: Keep in ICU, EGD and HD today  Consultants:   GI  Nephrology  Procedures:   None  Antimicrobials:   None    Subjective: Sleepy, no further n/v overnight  Objective: Vitals:   04/30/16 0500 04/30/16 0600 04/30/16 0744 04/30/16 0800  BP: (!) 84/40 (!) 81/42  (!) 71/39  Pulse: 81 74  73  Resp: 17 14  17   Temp:   97.7 F (36.5 C)   TempSrc:   Oral   SpO2: 100% 99%  98%  Weight: 66 kg (145 lb  8.1 oz)     Height:        Intake/Output Summary (Last 24 hours) at 04/30/16 0951 Last data filed at 04/30/16 0800  Gross per 24 hour  Intake          4540.26 ml  Output             1086 ml  Net          3454.26 ml   Filed Weights   04/29/16 0500 04/29/16 1320 04/30/16 0500  Weight: 63.2 kg (139 lb 5.3 oz) 63.1 kg (139 lb 1.8 oz) 66 kg (145 lb 8.1 oz)    Examination:  General exam: Groggy, awakens to voice and can answer simple questions. Respiratory system: Clear to auscultation. Respiratory effort normal. Cardiovascular system:RRR. No murmurs, rubs, gallops. Gastrointestinal system: Abdomen is distended, soft and nontender. Normal bowel sounds heard. Central nervous system:Unable to fully assess given current mental state. Extremities: No C/C/E, +pedal pulses Skin: No rashes, lesions or ulcers    Data Reviewed: I have personally reviewed following labs and imaging studies  CBC:  Recent Labs Lab 04/28/16 2210 04/29/16 0304 04/29/16 0834 04/29/16 1738 04/29/16 2044 04/30/16 0436  WBC 9.4 7.0 7.4 5.8 5.6 5.4  NEUTROABS 6.8  --   --   --   --   --   HGB 8.7* 8.7* 6.4* 8.7* 8.0* 7.6*  HCT 26.7* 26.6* 20.0* 25.9* 23.9* 23.3*  MCV  102.7* 104.3* 104.2* 95.6 96.0 98.3  PLT 132* 110* 92* 86* 71* 80*   Basic Metabolic Panel:  Recent Labs Lab 04/28/16 2210 04/30/16 0436  NA 131* 140  K 4.0 3.9  CL 94* 107  CO2 16* 21*  GLUCOSE 91 109*  BUN 120* 78*  CREATININE 10.85* 5.36*  CALCIUM 8.8* 7.9*   GFR: Estimated Creatinine Clearance: 12 mL/min (by C-G formula based on SCr of 5.36 mg/dL (H)). Liver Function Tests:  Recent Labs Lab 04/28/16 2210  AST 55*  ALT 31  ALKPHOS 104  BILITOT 1.0  PROT 6.6  ALBUMIN 3.0*    Recent Labs Lab 04/28/16 2210  LIPASE 188*   No results for input(s): AMMONIA in the last 168 hours. Coagulation Profile:  Recent Labs Lab 04/28/16 2210  INR 1.11   Cardiac Enzymes: No results for input(s): CKTOTAL, CKMB, CKMBINDEX,  TROPONINI in the last 168 hours. BNP (last 3 results) No results for input(s): PROBNP in the last 8760 hours. HbA1C: No results for input(s): HGBA1C in the last 72 hours. CBG: No results for input(s): GLUCAP in the last 168 hours. Lipid Profile: No results for input(s): CHOL, HDL, LDLCALC, TRIG, CHOLHDL, LDLDIRECT in the last 72 hours. Thyroid Function Tests: No results for input(s): TSH, T4TOTAL, FREET4, T3FREE, THYROIDAB in the last 72 hours. Anemia Panel: No results for input(s): VITAMINB12, FOLATE, FERRITIN, TIBC, IRON, RETICCTPCT in the last 72 hours. Urine analysis:    Component Value Date/Time   COLORURINE YELLOW 04/29/2016 0025   APPEARANCEUR CLEAR 04/29/2016 0025   LABSPEC 1.020 04/29/2016 0025   PHURINE 5.5 04/29/2016 0025   GLUCOSEU NEGATIVE 04/29/2016 0025   HGBUR NEGATIVE 04/29/2016 0025   BILIRUBINUR NEGATIVE 04/29/2016 0025   KETONESUR NEGATIVE 04/29/2016 0025   PROTEINUR NEGATIVE 04/29/2016 0025   UROBILINOGEN 0.2 08/13/2014 2138   NITRITE NEGATIVE 04/29/2016 0025   LEUKOCYTESUR NEGATIVE 04/29/2016 0025   Sepsis Labs: @LABRCNTIP (procalcitonin:4,lacticidven:4)  ) Recent Results (from the past 240 hour(s))  MRSA PCR Screening     Status: None   Collection Time: 04/29/16  2:30 AM  Result Value Ref Range Status   MRSA by PCR NEGATIVE NEGATIVE Final    Comment:        The GeneXpert MRSA Assay (FDA approved for NASAL specimens only), is one component of a comprehensive MRSA colonization surveillance program. It is not intended to diagnose MRSA infection nor to guide or monitor treatment for MRSA infections.          Radiology Studies: US Abdomen Limited  Result Date: 04/29/2016 CLINICAL DATA:  Cirrhosis, ascites EXAM: LIMITED ABDOMEN ULTRASOUND FOR ASCITES TECHNIQUE: Limited ultrasound survey for ascites was performed in all four abdominal quadrants. COMPARISON:  04/21/2016 FINDINGS: Scattered ascites is identified in all 4 quadrants greater in LEFT  lower quadrant. IMPRESSION: Scattered ascites throughout abdomen. Electronically Signed   By: Lavonia Dana M.D.   On: 04/29/2016 10:29   Dg Chest Port 1 View  Result Date: 04/30/2016 CLINICAL DATA:  Rhonchi.  Former smoker. EXAM: PORTABLE CHEST 1 VIEW COMPARISON:  10/08/2015 FINDINGS: The cardiac silhouette is upper limits of normal in size, accentuated by portable AP technique as well as shallow inspiration. There is central pulmonary vascular congestion. Minimal bibasilar opacities suggest atelectasis. No overt pulmonary edema, sizable pleural effusion, or pneumothorax is identified. No acute osseous abnormality is identified. IMPRESSION: Shallow lung inflation with pulmonary vascular congestion and minimal bibasilar atelectasis. Electronically Signed   By: Logan Bores M.D.   On: 04/30/2016 08:52  Scheduled Meds: . sodium chloride   Intravenous Once  . sodium chloride   Intravenous Once  . sodium chloride   Intravenous Once  . acetaminophen  650 mg Oral Once  . cefTRIAXone (ROCEPHIN)  IV  2 g Intravenous Q24H  . diphenhydrAMINE  25 mg Oral Once  . lactulose  10 g Oral Daily  . metoCLOPramide  5 mg Oral TID AC & HS  . midodrine  10 mg Oral TID  . [START ON 05/02/2016] pantoprazole  40 mg Intravenous Q12H  . sevelamer carbonate  800 mg Oral TID WC   Continuous Infusions: . sodium chloride 125 mL/hr at 04/30/16 0800  . octreotide  (SANDOSTATIN)    IV infusion 50 mcg/hr (04/30/16 0800)  . pantoprozole (PROTONIX) infusion 8 mg/hr (04/30/16 0800)     LOS: 1 day    Time spent: 25 minutes. Greater than 50% of this time was spent in direct contact with the patient coordinating care.     Lelon Frohlich, MD [facility] Pager [phone]  If 7PM-7AM, please contact night-coverage [email or web address] Password [facility] 04/30/2016, 9:51 AM

## 2016-04-30 NOTE — Progress Notes (Signed)
bp continues to be low 70s/30s. New orders received and executed.

## 2016-04-30 NOTE — Anesthesia Postprocedure Evaluation (Signed)
Anesthesia Post Note  Patient: Wanda [name]  Procedure(s) Performed: Procedure(s) (LRB): ESOPHAGOGASTRODUODENOSCOPY (EGD) WITH PROPOFOL (N/A) ESOPHAGEAL BANDING (N/A)  Patient location during evaluation: PACU Anesthesia Type: MAC Level of consciousness: awake and alert Pain management: pain level controlled Vital Signs Assessment: post-procedure vital signs reviewed and stable Respiratory status: spontaneous breathing and respiratory function stable Cardiovascular status: stable Anesthetic complications: no    Last Vitals:  Vitals:   04/30/16 1317 04/30/16 1330  BP:  (!) 99/47  Pulse: 68 72  Resp: 19 (!) 22  Temp: 36.9 C     Last Pain:  Vitals:   04/30/16 1203  TempSrc: Oral  PainSc:                  Leslee Haueter DANIEL

## 2016-04-30 NOTE — Anesthesia Preprocedure Evaluation (Addendum)
Anesthesia Evaluation  Patient identified by MRN, date of birth, ID band Patient confused    Reviewed: Allergy & Precautions, NPO status , Patient's Chart, lab work & pertinent test results  History of Anesthesia Complications (+) Emergence DeliriumNegative for: history of anesthetic complications  Airway Mallampati: II  TM Distance: >3 FB Neck ROM: Full    Dental no notable dental hx. (+) Dental Advisory Given   Pulmonary former smoker,    Pulmonary exam normal        Cardiovascular negative cardio ROS Normal cardiovascular exam     Neuro/Psych PSYCHIATRIC DISORDERS Anxiety Depression Bipolar Disorder    GI/Hepatic PUD, GERD  ,(+) Cirrhosis       ,   Endo/Other  negative endocrine ROS  Renal/GU ESRF and DialysisRenal disease     Musculoskeletal   Abdominal   Peds  Hematology   Anesthesia Other Findings   Reproductive/Obstetrics                            Anesthesia Physical Anesthesia Plan  ASA: IV and emergent  Anesthesia Plan: MAC   Post-op Pain Management:    Induction: Intravenous  Airway Management Planned: Natural Airway and Simple Face Mask  Additional Equipment:   Intra-op Plan:   Post-operative Plan:   Informed Consent: I have reviewed the patients History and Physical, chart, labs and discussed the procedure including the risks, benefits and alternatives for the proposed anesthesia with the patient or authorized representative who has indicated his/her understanding and acceptance.   Dental advisory given  Plan Discussed with: CRNA and Anesthesiologist  Anesthesia Plan Comments:       Anesthesia Quick Evaluation

## 2016-04-30 NOTE — Transfer of Care (Signed)
Immediate Anesthesia Transfer of Care Note  Patient: ANITRA SEDGWICK  Procedure(s) Performed: Procedure(s): ESOPHAGOGASTRODUODENOSCOPY (EGD) WITH PROPOFOL (N/A) ESOPHAGEAL BANDING (N/A)  Patient Location: PACU  Anesthesia Type:MAC  Level of Consciousness: sedated  Airway & Oxygen Therapy: Patient Spontanous Breathing and Patient connected to nasal cannula oxygen  Post-op Assessment: Report given to RN  Post vital signs: Reviewed and stable  Last Vitals:  Vitals:   04/30/16 1215 04/30/16 1317  BP: (!) 96/45   Pulse:    Resp: 16   Temp:  (P) 36.9 C    Last Pain:  Vitals:   04/30/16 1203  TempSrc: Oral  PainSc:       Patients Stated Pain Goal:  (altered mental status) ([id number])  Complications: No apparent anesthesia complications

## 2016-04-30 NOTE — Progress Notes (Signed)
Subjective:  Denies abdominal pain. Rested well. No complaints  Objective: Vital signs in last 24 hours: Temp:  [97 F (36.1 C)-99.1 F (37.3 C)] 97.7 F (36.5 C) (09/21 0744) Pulse Rate:  [69-107] 73 (09/21 0800) Resp:  [13-19] 17 (09/21 0800) BP: (71-114)/(35-67) 71/39 (09/21 0800) SpO2:  [98 %-100 %] 98 % (09/21 0800) Weight:  [139 lb 1.8 oz (63.1 kg)-145 lb 8.1 oz (66 kg)] 145 lb 8.1 oz (66 kg) (09/21 0500) Last BM Date: 04/29/16 General:   Alert,  Well-developed, well-nourished, pleasant and cooperative in NAD Head:  Normocephalic and atraumatic. Eyes:  Sclera clear, no icterus.  Chest: CTA bilaterally with rhonchi. Heart:  Regular rate and rhythm; no murmurs, clicks, rubs,  or gallops. Abdomen:  Soft, distended with ascites, reducible umb hernia. Normal bowel sounds, without guarding, and without rebound.   Extremities:  Without clubbing, deformity. 2+LE edema bilaterally. Neurologic:  Alert and  oriented x4;  grossly normal neurologically. Skin:  Intact without significant lesions or rashes. Psych:  Alert and cooperative. Normal mood and affect.  Intake/Output from previous day: 09/20 0701 - 09/21 0700 In: 2890.3 [I.V.:1916.3; Blood:974] Out: 1086  Intake/Output this shift: Total I/O In: 1650 [I.V.:1650] Out: -   Lab Results: CBC  Recent Labs  04/29/16 1738 04/29/16 2044 04/30/16 0436  WBC 5.8 5.6 5.4  HGB 8.7* 8.0* 7.6*  HCT 25.9* 23.9* 23.3*  MCV 95.6 96.0 98.3  PLT 86* 71* 80*   BMET  Recent Labs  04/28/16 2210 04/30/16 0436  NA 131* 140  K 4.0 3.9  CL 94* 107  CO2 16* 21*  GLUCOSE 91 109*  BUN 120* 78*  CREATININE 10.85* 5.36*  CALCIUM 8.8* 7.9*   LFTs  Recent Labs  04/28/16 2210  BILITOT 1.0  ALKPHOS 104  AST 55*  ALT 31  PROT 6.6  ALBUMIN 3.0*    Recent Labs  04/28/16 2210  LIPASE 188*   PT/INR  Recent Labs  04/28/16 2210  LABPROT 14.4  INR 1.11      Imaging Studies: US Abdomen Limited  Result Date:  04/29/2016 CLINICAL DATA:  Cirrhosis, ascites EXAM: LIMITED ABDOMEN ULTRASOUND FOR ASCITES TECHNIQUE: Limited ultrasound survey for ascites was performed in all four abdominal quadrants. COMPARISON:  04/21/2016 FINDINGS: Scattered ascites is identified in all 4 quadrants greater in LEFT lower quadrant. IMPRESSION: Scattered ascites throughout abdomen. Electronically Signed   By: Lavonia Dana M.D.   On: 04/29/2016 10:29   US Paracentesis  Result Date: 04/21/2016 INDICATION: Cirrhosis due to NASH, ascites EXAM: ULTRASOUND GUIDED THERAPEUTIC PARACENTESIS MEDICATIONS: None. COMPLICATIONS: None immediate. PROCEDURE: Procedure, benefits, and risks of procedure were discussed with patient. Written informed consent for procedure was obtained. Time out protocol followed. Adequate collection of ascites localized by ultrasound in LEFT lower quadrant. Skin prepped and draped in usual sterile fashion. Skin and soft tissues anesthetized with 10 mL of 1% lidocaine. 5 [place] Yueh catheter placed into peritoneal cavity. 2.6 L of cloudy yellow fluid aspirated by vacuum bottle suction. Procedure tolerated well by patient without immediate complication. FINDINGS: As above IMPRESSION: Successful ultrasound-guided paracentesis yielding 2.6 liters of peritoneal fluid. Electronically Signed   By: Lavonia Dana M.D.   On: 04/21/2016 14:31   US Paracentesis  Result Date: 04/15/2016 INDICATION: Ascites EXAM: ULTRASOUND GUIDED right PARACENTESIS MEDICATIONS: None. COMPLICATIONS: None immediate. PROCEDURE: Informed written consent was obtained from the patient after a discussion of the risks, benefits and alternatives to treatment. A timeout was performed prior to the initiation of the procedure.  Initial ultrasound scanning demonstrates a small amount of ascites within the right lower abdominal quadrant. The right lower abdomen was prepped and draped in the usual sterile fashion. 1% lidocaine with epinephrine was used for local anesthesia.  Following this, a 19 gauge, 7-cm, Yueh catheter was introduced. An ultrasound image was saved for documentation purposes. The paracentesis was performed. The catheter was removed and a dressing was applied. The patient tolerated the procedure well without immediate post procedural complication. FINDINGS: A total of approximately 3.3 L of straw-colored fluid was removed. IMPRESSION: Successful ultrasound-guided paracentesis yielding 3.3 liters of peritoneal fluid. Electronically Signed   By: Julian Hy M.D.   On: 04/15/2016 16:03   US Paracentesis  Result Date: 04/09/2016 INDICATION: Cirrhosis EXAM: ULTRASOUND GUIDED THERAPEUTIC PARACENTESIS MEDICATIONS: None. COMPLICATIONS: None immediate. PROCEDURE: Procedure, benefits, and risks of procedure were discussed with patient. Written informed consent for procedure was obtained. Time out protocol followed. Adequate collection of ascites localized by ultrasound in LEFT lower quadrant. Skin prepped and draped in usual sterile fashion. Skin and soft tissues anesthetized with 10 mL of 1% lidocaine. 5 [place] Yueh catheter placed into peritoneal cavity. 4.6 L of yellow colored fluid aspirated by vacuum bottle suction. Procedure tolerated well by patient without immediate complication. FINDINGS: As above IMPRESSION: Successful ultrasound-guided paracentesis yielding 4.6 liters of peritoneal fluid. Electronically Signed   By: Lavonia Dana M.D.   On: 04/09/2016 15:27   US Paracentesis  Result Date: 04/01/2016 INDICATION: Ascites. EXAM: ULTRASOUND GUIDED PARACENTESIS MEDICATIONS: None. COMPLICATIONS: None immediate. PROCEDURE: Informed written consent was obtained from the patient after a discussion of the risks, benefits and alternatives to treatment. A timeout was performed prior to the initiation of the procedure. Initial ultrasound scanning demonstrates a large amount of ascites within the right lower abdominal quadrant. The right lower abdomen was prepped and  draped in the usual sterile fashion. 1% lidocaine was used for local anesthesia. Following this, a 6 French catheter was introduced. An ultrasound image was saved for documentation purposes. The paracentesis was performed. The catheter was removed and a dressing was applied. The patient tolerated the procedure well without immediate post procedural complication. FINDINGS: A total of approximately 4.1 L of serosanguineous fluid was removed. IMPRESSION: Successful ultrasound-guided paracentesis yielding 4.1 liters of peritoneal fluid. Electronically Signed   By: Marcello Moores  Register   On: 04/01/2016 13:00   Dg Chest Port 1 View  Result Date: 04/30/2016 CLINICAL DATA:  Rhonchi.  Former smoker. EXAM: PORTABLE CHEST 1 VIEW COMPARISON:  10/08/2015 FINDINGS: The cardiac silhouette is upper limits of normal in size, accentuated by portable AP technique as well as shallow inspiration. There is central pulmonary vascular congestion. Minimal bibasilar opacities suggest atelectasis. No overt pulmonary edema, sizable pleural effusion, or pneumothorax is identified. No acute osseous abnormality is identified. IMPRESSION: Shallow lung inflation with pulmonary vascular congestion and minimal bibasilar atelectasis. Electronically Signed   By: Logan Bores M.D.   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 Developed coffee-ground emesis in ICU. Heme + stool in ICU. She has received 2 units of prbcs with increase in Hgb from 6.4 to 8.7. Down to 7.6 today. Dialyzed yesterday and plans for today as well. BPs have been low but this is issue for her chronically, received Midodrine on dialysis days. DDx: includes UGI bleed due to portal gastropathy, variceal bleeding. She is currently on octreotide, ceftriaxone, pantoprazole infusion. CXR today.  Plan: 1. Transfuse additional prbcs today. Keep two units available. Advised nursing  staff to transfuse now rather than during dialysis.   2. EGD with propofol today. Spoke with Dr. Gala Romney and ICU nursing staff, need to dialyze AFTER EGD.   Laureen Ochs. Bernarda Caffey [facility] [phone] 9/21/20179:31 AM  Addendum: CXR results noted, see above. No overt pulmonary edema, pleural effusion. Some central pulmonary vascular congestion. MANAGEMENT PER ATTENDING.  Laureen Ochs. Bernarda Caffey [facility] [phone] 9/21/20179:33 AM     LOS: 1 day

## 2016-04-30 NOTE — Anesthesia Postprocedure Evaluation (Signed)
Anesthesia Post Note  Patient: Wanda [name]  Procedure(s) Performed: Procedure(s) (LRB): ESOPHAGOGASTRODUODENOSCOPY (EGD) WITH PROPOFOL (N/A) ESOPHAGEAL BANDING (N/A)  Patient location during evaluation: PACU Anesthesia Type: MAC Level of consciousness: sedated and lethargic Pain management: pain level controlled Vital Signs Assessment: post-procedure vital signs reviewed and stable Respiratory status: spontaneous breathing Cardiovascular status: blood pressure returned to baseline Postop Assessment: no signs of nausea or vomiting Anesthetic complications: no    Last Vitals:  Vitals:   04/30/16 1215 04/30/16 1317  BP: (!) 96/45   Pulse:    Resp: 16   Temp:  (P) 36.9 C    Last Pain:  Vitals:   04/30/16 1203  TempSrc: Oral  PainSc:                  Tressie Stalker

## 2016-04-30 NOTE — Procedures (Signed)
HEMODIALYSIS TREATMENT NOTE:  3.5 hour heparin-free dialysis completed via right upper arm AVF (16g/antegrade). Goal nearly met: 1.9 liters removed.  Ultrafiltration was interrupted x 30 minutes total for SBP<90 (asymptomatic). All blood was returned and hemostasis was achieved within 25mnutes. Report given to JJanett Billow RTherapist, sports  ARockwell Alexandria RN, CDN

## 2016-04-30 NOTE — Progress Notes (Addendum)
Subjective: Interval History: Patient says that she is feeling better. Presently she removed her bowels. She didn't have any nausea or vomiting.  Objective: Vital signs in last 24 hours: Temp:  [97 F (36.1 C)-99.1 F (37.3 C)] 97.7 F (36.5 C) (09/21 0744) Pulse Rate:  [69-107] 74 (09/21 0600) Resp:  [13-19] 14 (09/21 0600) BP: (72-114)/(35-67) 81/42 (09/21 0600) SpO2:  [99 %-100 %] 99 % (09/21 0600) Weight:  [63.1 kg (139 lb 1.8 oz)-66 kg (145 lb 8.1 oz)] 66 kg (145 lb 8.1 oz) (09/21 0500) Weight change: -4.94 kg (-10 lb 14.2 oz)  Intake/Output from previous day: 09/20 0701 - 09/21 0700 In: 2890.3 [I.V.:1916.3; Blood:974] Out: 1086  Intake/Output this shift: No intake/output data recorded.  General appearance: alert, cooperative and no distress Resp: clear to auscultation bilaterally Cardio: regular rate and rhythm GI: Abdomen is distended, nontender. Extremities: edema She has 1+ edema bilaterally  Lab Results:  Recent Labs  04/29/16 2044 04/30/16 0436  WBC 5.6 5.4  HGB 8.0* 7.6*  HCT 23.9* 23.3*  PLT 71* 80*   BMET:  Recent Labs  04/28/16 2210 04/30/16 0436  NA 131* 140  K 4.0 3.9  CL 94* 107  CO2 16* 21*  GLUCOSE 91 109*  BUN 120* 78*  CREATININE 10.85* 5.36*  CALCIUM 8.8* 7.9*   No results for input(s): PTH in the last 72 hours. Iron Studies: No results for input(s): IRON, TIBC, TRANSFERRIN, FERRITIN in the last 72 hours.  Studies/Results: US Abdomen Limited  Result Date: 04/29/2016 CLINICAL DATA:  Cirrhosis, ascites EXAM: LIMITED ABDOMEN ULTRASOUND FOR ASCITES TECHNIQUE: Limited ultrasound survey for ascites was performed in all four abdominal quadrants. COMPARISON:  04/21/2016 FINDINGS: Scattered ascites is identified in all 4 quadrants greater in LEFT lower quadrant. IMPRESSION: Scattered ascites throughout abdomen. Electronically Signed   By: Lavonia Dana M.D.   On: 04/29/2016 10:29    I have reviewed the patient's current  medications.  Assessment/Plan: Problem #1 history of GI bleeding . Patient present with tarry stool. Her hemoglobin is low and declining. Problem #2 end-stage renal disease: She is status post hemodialysis yesterday. Presently she denies any nausea or vomiting. Her potassium is good Problem #3 history of liver cirrhosis Problem #4 history of hypotension: Presently on Midodrine mainly during dialysis days. Problem #5 metabolic bone disease: Her calcium is in range and phosphorus as an outpatient is high because of noncompliance with her medication and diet. Problem #6 noncompliance: Including her dialysis, salt and fluid intake.  Problem #7 history of ascites. Plan:1]  will make arrangements for patient dialysis today as this is her regular schedule 2] will check CBC and renal panel in the morning   LOS: 1 day   Tarri Guilfoil S 04/30/2016,8:23 AM

## 2016-05-01 ENCOUNTER — Encounter (HOSPITAL_COMMUNITY): Payer: Self-pay

## 2016-05-01 ENCOUNTER — Inpatient Hospital Stay (HOSPITAL_COMMUNITY): Payer: Medicare Other

## 2016-05-01 DIAGNOSIS — D696 Thrombocytopenia, unspecified: Secondary | ICD-10-CM

## 2016-05-01 DIAGNOSIS — N186 End stage renal disease: Secondary | ICD-10-CM

## 2016-05-01 DIAGNOSIS — K226 Gastro-esophageal laceration-hemorrhage syndrome: Secondary | ICD-10-CM

## 2016-05-01 DIAGNOSIS — G8929 Other chronic pain: Secondary | ICD-10-CM

## 2016-05-01 DIAGNOSIS — R109 Unspecified abdominal pain: Secondary | ICD-10-CM

## 2016-05-01 DIAGNOSIS — I8510 Secondary esophageal varices without bleeding: Secondary | ICD-10-CM

## 2016-05-01 LAB — CBC
HCT: 30.5 % — ABNORMAL LOW (ref 36.0–46.0)
HEMOGLOBIN: 9.8 g/dL — AB (ref 12.0–15.0)
MCH: 31.5 pg (ref 26.0–34.0)
MCHC: 32.1 g/dL (ref 30.0–36.0)
MCV: 98.1 fL (ref 78.0–100.0)
Platelets: 95 10*3/uL — ABNORMAL LOW (ref 150–400)
RBC: 3.11 MIL/uL — AB (ref 3.87–5.11)
RDW: 20.9 % — ABNORMAL HIGH (ref 11.5–15.5)
WBC: 8.5 10*3/uL (ref 4.0–10.5)

## 2016-05-01 LAB — RENAL FUNCTION PANEL
ANION GAP: 10 (ref 5–15)
Albumin: 2.6 g/dL — ABNORMAL LOW (ref 3.5–5.0)
BUN: 41 mg/dL — ABNORMAL HIGH (ref 6–20)
CALCIUM: 8.1 mg/dL — AB (ref 8.9–10.3)
CO2: 23 mmol/L (ref 22–32)
Chloride: 106 mmol/L (ref 101–111)
Creatinine, Ser: 3.89 mg/dL — ABNORMAL HIGH (ref 0.44–1.00)
GFR, EST AFRICAN AMERICAN: 15 mL/min — AB (ref >60)
GFR, EST NON AFRICAN AMERICAN: 13 mL/min — AB (ref >60)
Glucose, Bld: 99 mg/dL (ref 65–99)
PHOSPHORUS: 4.3 mg/dL (ref 2.5–4.6)
Potassium: 3.5 mmol/L (ref 3.5–5.1)
SODIUM: 139 mmol/L (ref 135–145)

## 2016-05-01 MED ORDER — OXYCODONE HCL 5 MG PO TABS
10.0000 mg | ORAL_TABLET | Freq: Three times a day (TID) | ORAL | Status: DC
  Administered 2016-05-01 – 2016-05-02 (×4): 10 mg via ORAL
  Filled 2016-05-01 (×4): qty 2

## 2016-05-01 MED ORDER — SODIUM CHLORIDE 0.9 % IV SOLN
50.0000 ug/h | INTRAVENOUS | Status: DC
  Filled 2016-05-01: qty 1

## 2016-05-01 MED ORDER — LIDOCAINE VISCOUS 2 % MT SOLN
10.0000 mL | OROMUCOSAL | Status: DC | PRN
  Administered 2016-05-01 – 2016-05-02 (×5): 10 mL via OROMUCOSAL
  Filled 2016-05-01 (×5): qty 15

## 2016-05-01 MED ORDER — RIFAXIMIN 550 MG PO TABS
550.0000 mg | ORAL_TABLET | Freq: Two times a day (BID) | ORAL | Status: DC
  Administered 2016-05-01 – 2016-05-02 (×3): 550 mg via ORAL
  Filled 2016-05-01 (×3): qty 1

## 2016-05-01 MED ORDER — ONDANSETRON HCL 4 MG/2ML IJ SOLN
4.0000 mg | Freq: Three times a day (TID) | INTRAMUSCULAR | Status: DC
  Administered 2016-05-01 – 2016-05-02 (×5): 4 mg via INTRAVENOUS
  Filled 2016-05-01 (×6): qty 2

## 2016-05-01 MED ORDER — SENNA 8.6 MG PO TABS
1.0000 | ORAL_TABLET | Freq: Three times a day (TID) | ORAL | Status: DC
  Administered 2016-05-01 – 2016-05-02 (×4): 8.6 mg via ORAL
  Filled 2016-05-01 (×4): qty 1

## 2016-05-01 MED ORDER — METOCLOPRAMIDE HCL 5 MG/ML IJ SOLN
5.0000 mg | Freq: Three times a day (TID) | INTRAMUSCULAR | Status: DC
  Administered 2016-05-01 – 2016-05-02 (×5): 5 mg via INTRAVENOUS
  Filled 2016-05-01 (×5): qty 2

## 2016-05-01 NOTE — Addendum Note (Signed)
Addendum  created 05/01/16 1049 by Vista Deck, CRNA   Sign clinical note

## 2016-05-01 NOTE — Procedures (Signed)
PreOperative Dx: Cirrhosis, ascites Postoperative Dx: Cirrhosis, ascites Procedure:   US guided paracentesis Radiologist:  Koa Palla Anesthesia:  10 ml of1% lidocaine Specimen:  5.4 L of yellow ascitic fluid EBL:   < 1 ml Complications: None  

## 2016-05-01 NOTE — Anesthesia Postprocedure Evaluation (Signed)
Anesthesia Post Note  Patient: Wanda [name]  Procedure(s) Performed: Procedure(s) (LRB): ESOPHAGOGASTRODUODENOSCOPY (EGD) WITH PROPOFOL (N/A) ESOPHAGEAL BANDING (N/A)  Patient location during evaluation: ICU Anesthesia Type: MAC Level of consciousness: awake and alert and patient cooperative Pain management: pain level controlled Vital Signs Assessment: post-procedure vital signs reviewed and stable Respiratory status: spontaneous breathing, nonlabored ventilation and respiratory function stable Cardiovascular status: blood pressure returned to baseline Postop Assessment: no signs of nausea or vomiting and adequate PO intake Anesthetic complications: no    Last Vitals:  Vitals:   05/01/16 0000 05/01/16 0500  BP:    Pulse:    Resp:    Temp: 36.4 C 36.1 C    Last Pain:  Vitals:   05/01/16 0500  TempSrc: Oral  PainSc:                  [name]

## 2016-05-01 NOTE — Progress Notes (Signed)
Subjective: Interval History: Patient denies any nausea or vomiting. However she stated that her abdomen is a little bit tight. At this moment she offers no complaints.  Objective: Vital signs in last 24 hours: Temp:  [97 F (36.1 C)-98.4 F (36.9 C)] 98.2 F (36.8 C) (09/22 1110) Pulse Rate:  [60-86] 60 (09/22 1110) Resp:  [15-37] 20 (09/22 1110) BP: (78-117)/(43-79) 99/51 (09/22 1110) SpO2:  [92 %-100 %] 100 % (09/22 1110) Weight:  [66.3 kg (146 lb 2.6 oz)-69.1 kg (152 lb 5.4 oz)] 69.1 kg (152 lb 5.4 oz) (09/22 0500) Weight change: 3.2 kg (7 lb 0.9 oz)  Intake/Output from previous day: 09/21 0701 - 09/22 0700 In: [id number] [I.V.:3350; Blood:30] Out: 1900  Intake/Output this shift: No intake/output data recorded.  General appearance: alert, cooperative and no distress Resp: clear to auscultation bilaterally Cardio: regular rate and rhythm GI: Abdomen is distended, nontender. Extremities: edema She has 1+ edema bilaterally  Lab Results:  Recent Labs  04/30/16 0436 04/30/16 1543 05/01/16 0637  WBC 5.4  --  8.5  HGB 7.6* 9.7* 9.8*  HCT 23.3* 29.5* 30.5*  PLT 80*  --  95*   BMET:   Recent Labs  04/30/16 0436 05/01/16 0637  NA 140 139  K 3.9 3.5  CL 107 106  CO2 21* 23  GLUCOSE 109* 99  BUN 78* 41*  CREATININE 5.36* 3.89*  CALCIUM 7.9* 8.1*   No results for input(s): PTH in the last 72 hours. Iron Studies: No results for input(s): IRON, TIBC, TRANSFERRIN, FERRITIN in the last 72 hours.  Studies/Results: Dg Chest Port 1 View  Result Date: 04/30/2016 CLINICAL DATA:  Rhonchi.  Former smoker. EXAM: PORTABLE CHEST 1 VIEW COMPARISON:  10/08/2015 FINDINGS: The cardiac silhouette is upper limits of normal in size, accentuated by portable AP technique as well as shallow inspiration. There is central pulmonary vascular congestion. Minimal bibasilar opacities suggest atelectasis. No overt pulmonary edema, sizable pleural effusion, or pneumothorax is identified. No acute  osseous abnormality is identified. IMPRESSION: Shallow lung inflation with pulmonary vascular congestion and minimal bibasilar atelectasis. Electronically Signed   By: Logan Bores M.D.   On: 04/30/2016 08:52    I have reviewed the patient's current medications.  Assessment/Plan: Problem #1 history of GI bleeding . Patient with GI bleeding. Presently her hemoglobin remains stable. Patient denies any blood in the stool. Problem #2 end-stage renal disease: She is status post hemodialysis yesterday. Presently her potassium is normal and she is asymptomatic. Problem #3 history of liver cirrhosis Problem #4 history of hypotension: Presently on Midodrine mainly during dialysis days. Problem #5 metabolic bone disease: Her calcium and phosphorus in the range. Problem #6 noncompliance: Including her dialysis, salt and fluid intake.  Problem #7 history of ascites: Seems to be increasing. Patient is going to have paracentesis today. Plan:1]  will make arrangements for patient dialysis tomorrow which is her regular schedule. 2] will check CBC and renal panel in the morning   LOS: 2 days   Sharisa Toves S 05/01/2016,11:44 AM

## 2016-05-01 NOTE — Progress Notes (Signed)
PROGRESS NOTE    Wanda [name]  [name] DOB: 09/01/1975 DOA: 04/28/2016 PCP: [name], MD     Brief Narrative:  40 y/o woman admitted on 9/19 with c/o melena. Has a h/o NASH, ESRD on HD, GERD. Also has a h/o varices that have been banded in the past. She also gets frequent paracentesis for her abdominal ascites. While in the hospital developed hematemesis. Has been transfused 3 units of PRBCs. EGD with MWT.   Assessment & Plan:   Principal Problem:   UGIB (upper gastrointestinal bleed) Active Problems:   Malnutrition of moderate degree (HCC)   Esophageal varices without bleeding (HCC)   Thrombocytopenia (HCC)   Liver cirrhosis secondary to NASH   Rhonchi   Mallory-Weiss tear   UGIB -EGD with MWT. -Continue PPI, DC octreotide. -Monitor for signs of rebleeding. -Appreciate GI input and recommendations.  Acute Blood Loss Anemia -On top of anemia of chronic disease. -Hb dropped to 6.4 on 9/20 and increased to 8.7 after transfusion of 2 units of PRBCs. -On 9/21, Hb has further decreased to 7.6 and will receive another unit of PRBCs as ordered by GI. -On 9/22 Hb stable at 9.8.  ESRD -Followed by nephrology. -Has been receiving HD in the hospital. -BP has been low, likely a combination of acute blood loss from GIB on top of chronic hypotension from cirrhosis.  NASH -Stable, may be approaching need for paracentesis as her abdomen is tight and she is c/o pain. -Since we are approaching the weekend will order for today.  Thrombocytopenia -Due to cirrhosis.     DVT prophylaxis: SCDs Code Status: Full Code Family Communication: significant other at bedside Disposition Plan: transfer to floor today  Consultants:   GI  Nephrology  Procedures:   None  Antimicrobials:   None    Subjective: Sleepy, no further n/v overnight  Objective: Vitals:   04/30/16 2200 05/01/16 0000 05/01/16 0500 05/01/16 0800  BP: 104/76     Pulse: 81     Resp:       Temp: 97.3 F (36.3 C) 97.5 F (36.4 C) 97 F (36.1 C) 98.2 F (36.8 C)  TempSrc: Oral Oral Oral Oral  SpO2:      Weight:   69.1 kg (152 lb 5.4 oz)   Height:        Intake/Output Summary (Last 24 hours) at 05/01/16 1017 Last data filed at 04/30/16 2145  Gross per 24 hour  Intake             1730 ml  Output             1900 ml  Net             -170 ml   Filed Weights   04/30/16 0500 04/30/16 1800 05/01/16 0500  Weight: 66 kg (145 lb 8.1 oz) 66.3 kg (146 lb 2.6 oz) 69.1 kg (152 lb 5.4 oz)    Examination:  General exam: Groggy, awakens to voice and can answer simple questions. Respiratory system: Clear to auscultation. Respiratory effort normal. Cardiovascular system:RRR. No murmurs, rubs, gallops. Gastrointestinal system: Abdomen is distended, soft and nontender. Normal bowel sounds heard. Central nervous system:Unable to fully assess given current mental state. Extremities: No C/C/E, +pedal pulses Skin: No rashes, lesions or ulcers    Data Reviewed: I have personally reviewed following labs and imaging studies  CBC:  Recent Labs Lab 04/28/16 2210  04/29/16 0834 04/29/16 1738 04/29/16 2044 04/30/16 0436 04/30/16 1543 05/01/16 0637  WBC 9.4  < >  7.4 5.8 5.6 5.4  --  8.5  NEUTROABS 6.8  --   --   --   --   --   --   --   HGB 8.7*  < > 6.4* 8.7* 8.0* 7.6* 9.7* 9.8*  HCT 26.7*  < > 20.0* 25.9* 23.9* 23.3* 29.5* 30.5*  MCV 102.7*  < > 104.2* 95.6 96.0 98.3  --  98.1  PLT 132*  < > 92* 86* 71* 80*  --  95*  < > = values in this interval not displayed. Basic Metabolic Panel:  Recent Labs Lab 04/28/16 2210 04/30/16 0436 05/01/16 0637  NA 131* 140 139  K 4.0 3.9 3.5  CL 94* 107 106  CO2 16* 21* 23  GLUCOSE 91 109* 99  BUN 120* 78* 41*  CREATININE 10.85* 5.36* 3.89*  CALCIUM 8.8* 7.9* 8.1*  PHOS  --   --  4.3   GFR: Estimated Creatinine Clearance: 16.9 mL/min (by C-G formula based on SCr of 3.89 mg/dL (H)). Liver Function Tests:  Recent Labs Lab  04/28/16 2210 05/01/16 0637  AST 55*  --   ALT 31  --   ALKPHOS 104  --   BILITOT 1.0  --   PROT 6.6  --   ALBUMIN 3.0* 2.6*    Recent Labs Lab 04/28/16 2210  LIPASE 188*   No results for input(s): AMMONIA in the last 168 hours. Coagulation Profile:  Recent Labs Lab 04/28/16 2210  INR 1.11   Cardiac Enzymes: No results for input(s): CKTOTAL, CKMB, CKMBINDEX, TROPONINI in the last 168 hours. BNP (last 3 results) No results for input(s): PROBNP in the last 8760 hours. HbA1C: No results for input(s): HGBA1C in the last 72 hours. CBG: No results for input(s): GLUCAP in the last 168 hours. Lipid Profile: No results for input(s): CHOL, HDL, LDLCALC, TRIG, CHOLHDL, LDLDIRECT in the last 72 hours. Thyroid Function Tests: No results for input(s): TSH, T4TOTAL, FREET4, T3FREE, THYROIDAB in the last 72 hours. Anemia Panel: No results for input(s): VITAMINB12, FOLATE, FERRITIN, TIBC, IRON, RETICCTPCT in the last 72 hours. Urine analysis:    Component Value Date/Time   COLORURINE YELLOW 04/29/2016 0025   APPEARANCEUR CLEAR 04/29/2016 0025   LABSPEC 1.020 04/29/2016 0025   PHURINE 5.5 04/29/2016 0025   GLUCOSEU NEGATIVE 04/29/2016 0025   HGBUR NEGATIVE 04/29/2016 0025   BILIRUBINUR NEGATIVE 04/29/2016 0025   KETONESUR NEGATIVE 04/29/2016 0025   PROTEINUR NEGATIVE 04/29/2016 0025   UROBILINOGEN 0.2 08/13/2014 2138   NITRITE NEGATIVE 04/29/2016 0025   LEUKOCYTESUR NEGATIVE 04/29/2016 0025   Sepsis Labs: @LABRCNTIP (procalcitonin:4,lacticidven:4)  ) Recent Results (from the past 240 hour(s))  MRSA PCR Screening     Status: None   Collection Time: 04/29/16  2:30 AM  Result Value Ref Range Status   MRSA by PCR NEGATIVE NEGATIVE Final    Comment:        The GeneXpert MRSA Assay (FDA approved for NASAL specimens only), is one component of a comprehensive MRSA colonization surveillance program. It is not intended to diagnose MRSA infection nor to guide or monitor  treatment for MRSA infections.          Radiology Studies: Dg Chest Port 1 View  Result Date: 04/30/2016 CLINICAL DATA:  Rhonchi.  Former smoker. EXAM: PORTABLE CHEST 1 VIEW COMPARISON:  10/08/2015 FINDINGS: The cardiac silhouette is upper limits of normal in size, accentuated by portable AP technique as well as shallow inspiration. There is central pulmonary vascular congestion. Minimal bibasilar opacities suggest atelectasis.  No overt pulmonary edema, sizable pleural effusion, or pneumothorax is identified. No acute osseous abnormality is identified. IMPRESSION: Shallow lung inflation with pulmonary vascular congestion and minimal bibasilar atelectasis. Electronically Signed   By: Logan Bores M.D.   On: 04/30/2016 08:52        Scheduled Meds: . sodium chloride   Intravenous Once  . sodium chloride   Intravenous Once  . acetaminophen  650 mg Oral Once  . cefTRIAXone (ROCEPHIN)  IV  2 g Intravenous Q24H  . diphenhydrAMINE  25 mg Oral Once  . lactulose  10 g Oral Daily  . metoCLOPramide (REGLAN) injection  5 mg Intravenous Q6H  . midodrine  10 mg Oral TID  . ondansetron (ZOFRAN) IV  4 mg Intravenous Q6H  . pantoprazole  40 mg Oral BID AC  . sevelamer carbonate  800 mg Oral TID WC   Continuous Infusions: . sodium chloride 125 mL/hr at 05/01/16 0632     LOS: 2 days    Time spent: 25 minutes. Greater than 50% of this time was spent in direct contact with the patient coordinating care.     Lelon Frohlich, MD [facility] Pager [phone]  If 7PM-7AM, please contact night-coverage [email or web address] Password [facility] 05/01/2016, 10:17 AM

## 2016-05-01 NOTE — Care Management Important Message (Signed)
Important Message  Patient Details  Name: CHANDELL ABBY MRN: [id number] Date of Birth: 03/11/1976   Medicare Important Message Given:  Yes    Sherald Barge, RN 05/01/2016, 10:45 AM

## 2016-05-01 NOTE — Care Management Note (Signed)
Case Management Note  Patient Details  Name: Wanda [name] MRN: [id number] Date of Birth: 06-05-1976  Subjective/Objective:                  Pt admitted with UGIB. She is from home, lives with her spouse and is ind with ADL's. She has PCP, transportation to appointments and insurance with drug coverage. She is on HD T/TR/F. She has BSC and walker to use as needed. She plans to return home with self care at DC.   Action/Plan: No CM needs anticipated.   Expected Discharge Date:     05/04/2016             Expected Discharge Plan:  Home/Self Care  In-House Referral:  NA  Discharge planning Services     Post Acute Care Choice:  NA Choice offered to:  NA  DME Arranged:    DME Agency:     HH Arranged:    HH Agency:     Status of Service:  Completed, signed off  If discussed at [employer] of Stay Meetings, dates discussed:    Additional Comments:  Sherald Barge, RN 05/01/2016, 10:46 AM

## 2016-05-01 NOTE — Progress Notes (Signed)
Called report to RN on 300. Patient stable enough to travel by wheel chair and transferred with IV fluid continuing to room 302

## 2016-05-01 NOTE — Addendum Note (Signed)
Addendum  created 05/01/16 [id number] by Charmaine Downs, CRNA   Sign clinical note

## 2016-05-01 NOTE — Progress Notes (Signed)
Subjective: Doing ok today. Has upper chest soreness. Has not noted further bleed. Last bowel movement last night. Still on Sandostatin this morning. No further N/V or heaving. Is having back and shoulder soreness, PT supposed to come in today to work with her. No further complaints. Also some lower abdominal/periumbilical soreness, feels it's a little more swollen.  Objective: Vital signs in last 24 hours: Temp:  [97 F (36.1 C)-98.4 F (36.9 C)] 98.2 F (36.8 C) (09/22 0800) Pulse Rate:  [60-86] 81 (09/21 2200) Resp:  [15-37] 22 (09/21 1800) BP: (78-117)/(42-79) 104/76 (09/21 2200) SpO2:  [92 %-100 %] 100 % (09/21 1800) Weight:  [146 lb 2.6 oz (66.3 kg)-152 lb 5.4 oz (69.1 kg)] 152 lb 5.4 oz (69.1 kg) (09/22 0500) Last BM Date: 04/29/16 General:   Alert and oriented, pleasant Head:  Normocephalic and atraumatic. Eyes:  No icterus, sclera clear. Conjuctiva pink.  Heart:  S1, S2 present, no murmurs noted.  Lungs: Clear to auscultation bilaterally, without wheezing, rales, or rhonchi.  Abdomen:  Bowel sounds present, mild tenderness, non-distended. Abdomen firm but no tense ascites, umbilical hernia noted. No HSM or hernias noted. No rebound or guarding. No masses appreciated. 2-3+ bilateral LE edema noted.  Msk:  Symmetrical without gross deformities. Pulses:  Normal bilateral DP pulses noted. Extremities:  Without clubbing or edema. Neurologic:  Alert and  oriented x4;  grossly normal neurologically.  Cervical Nodes:  No significant cervical adenopathy. Psych:  Alert and cooperative. Normal mood and affect. Sleepy but easily wakens to voice and maintains attention, answering questions appropriately.  Intake/Output from previous day: 09/21 0701 - 09/22 0700 In: 3380 [I.V.:3350; Blood:30] Out: 1900  Intake/Output this shift: No intake/output data recorded.  Lab Results:  Recent Labs  04/29/16 2044 04/30/16 0436 04/30/16 1543 05/01/16 0637  WBC 5.6 5.4  --  8.5  HGB  8.0* 7.6* 9.7* 9.8*  HCT 23.9* 23.3* 29.5* 30.5*  PLT 71* 80*  --  95*   BMET  Recent Labs  04/28/16 2210 04/30/16 0436 05/01/16 0637  NA 131* 140 139  K 4.0 3.9 3.5  CL 94* 107 106  CO2 16* 21* 23  GLUCOSE 91 109* 99  BUN 120* 78* 41*  CREATININE 10.85* 5.36* 3.89*  CALCIUM 8.8* 7.9* 8.1*   LFT  Recent Labs  04/28/16 2210 05/01/16 0637  PROT 6.6  --   ALBUMIN 3.0* 2.6*  AST 55*  --   ALT 31  --   ALKPHOS 104  --   BILITOT 1.0  --    PT/INR  Recent Labs  04/28/16 2210  LABPROT 14.4  INR 1.11   Hepatitis Panel  Recent Labs  04/29/16 1438  HEPBSAG Negative     Studies/Results: US Abdomen Limited  Result Date: 04/29/2016 CLINICAL DATA:  Cirrhosis, ascites EXAM: LIMITED ABDOMEN ULTRASOUND FOR ASCITES TECHNIQUE: Limited ultrasound survey for ascites was performed in all four abdominal quadrants. COMPARISON:  04/21/2016 FINDINGS: Scattered ascites is identified in all 4 quadrants greater in LEFT lower quadrant. IMPRESSION: Scattered ascites throughout abdomen. Electronically Signed   By: Lavonia Dana M.D.   On: 04/29/2016 10:29   Dg Chest Port 1 View  Result Date: 04/30/2016 CLINICAL DATA:  Rhonchi.  Former smoker. EXAM: PORTABLE CHEST 1 VIEW COMPARISON:  10/08/2015 FINDINGS: The cardiac silhouette is upper limits of normal in size, accentuated by portable AP technique as well as shallow inspiration. There is central pulmonary vascular congestion. Minimal bibasilar opacities suggest atelectasis. No overt pulmonary edema, sizable pleural  effusion, or pneumothorax is identified. No acute osseous abnormality is identified. IMPRESSION: Shallow lung inflation with pulmonary vascular congestion and minimal bibasilar atelectasis. Electronically Signed   By: Logan Bores M.D.   On: 04/30/2016 08:52    Assessment: 40 y/o female well known to our service with history of cirrhosis/ESRD on dialysis who presented with melena and drop in Hgb. Developed coffee-ground emesis in  ICU. Heme + stool in ICU. She has received 2 units of prbcs with increase in Hgb from 6.4 to 8.7. Down to 7.6 yesterday morning. Dialyzed 2 days ago and and again yesterday. BPs have been low but this is issue for her chronically, received Midodrine on dialysis days.   EGD completed yesterday demonstrated Mallory-Weiss tear with adherent clot, varices all remain obliterated, oozy portal gastropathy. Medications changed post-procedure to include Reglan and Zofran with goal of preventing any vomiting or retching to prevent rebleed.  CXR yesterday with low lung inflation with pulmonary vascular congestion and minimal bibasilar atelectasis..  Today her hgb is stable (9.8 compared to 9.7 yesterday), Cr decreased to 3.89 s/p dialysis.. Today she is doing ok. Denies further nausea/heaving/vomiting on antiemetics. Had a bowel movement, unsure if it was dark or not. AAO x 4. Back and neck soreness. BP low/normal but within chronic baseline. Clinically improved.  Plan: 1. Discontinue octreotide 2. Monitor for further GI bleed 3. All measures to prevent nausea, heaving, and vomiting 4. If continued abdominal swelling/tenderness can set her up for therapeutic paracentesis  5. Check CBC, HFP, PT/INR tomorrow   Thank you for allowing [place] to participate in the care of Wanda [name]  Walden Field, DNP, AGNP-C Adult & Gerontological Nurse Practitioner [facility]    LOS: 2 days    05/01/2016, 8:39 AM

## 2016-05-02 DIAGNOSIS — E440 Moderate protein-calorie malnutrition: Secondary | ICD-10-CM

## 2016-05-02 LAB — RENAL FUNCTION PANEL
ANION GAP: 12 (ref 5–15)
Albumin: 2.4 g/dL — ABNORMAL LOW (ref 3.5–5.0)
BUN: 48 mg/dL — ABNORMAL HIGH (ref 6–20)
CALCIUM: 7.7 mg/dL — AB (ref 8.9–10.3)
CO2: 20 mmol/L — ABNORMAL LOW (ref 22–32)
CREATININE: 5.26 mg/dL — AB (ref 0.44–1.00)
Chloride: 103 mmol/L (ref 101–111)
GFR, EST AFRICAN AMERICAN: 11 mL/min — AB (ref >60)
GFR, EST NON AFRICAN AMERICAN: 9 mL/min — AB (ref >60)
Glucose, Bld: 158 mg/dL — ABNORMAL HIGH (ref 65–99)
Phosphorus: 5.9 mg/dL — ABNORMAL HIGH (ref 2.5–4.6)
Potassium: 3.3 mmol/L — ABNORMAL LOW (ref 3.5–5.1)
SODIUM: 135 mmol/L (ref 135–145)

## 2016-05-02 LAB — HEPATIC FUNCTION PANEL
ALBUMIN: 2.3 g/dL — AB (ref 3.5–5.0)
ALT: 24 U/L (ref 14–54)
AST: 46 U/L — AB (ref 15–41)
Alkaline Phosphatase: 86 U/L (ref 38–126)
BILIRUBIN TOTAL: 0.8 mg/dL (ref 0.3–1.2)
Bilirubin, Direct: 0.2 mg/dL (ref 0.1–0.5)
Indirect Bilirubin: 0.6 mg/dL (ref 0.3–0.9)
Total Protein: 4.8 g/dL — ABNORMAL LOW (ref 6.5–8.1)

## 2016-05-02 LAB — PROTIME-INR
INR: 1.1
Prothrombin Time: 14.2 seconds (ref 11.4–15.2)

## 2016-05-02 LAB — CBC
HEMATOCRIT: 31.5 % — AB (ref 36.0–46.0)
Hemoglobin: 10 g/dL — ABNORMAL LOW (ref 12.0–15.0)
MCH: 32.1 pg (ref 26.0–34.0)
MCHC: 31.7 g/dL (ref 30.0–36.0)
MCV: 101 fL — AB (ref 78.0–100.0)
Platelets: 97 10*3/uL — ABNORMAL LOW (ref 150–400)
RBC: 3.12 MIL/uL — AB (ref 3.87–5.11)
RDW: 21 % — AB (ref 11.5–15.5)
WBC: 7.7 10*3/uL (ref 4.0–10.5)

## 2016-05-02 LAB — TYPE AND SCREEN
ABO/RH(D): A POS
Antibody Screen: NEGATIVE
UNIT DIVISION: 0
UNIT DIVISION: 0
Unit division: 0
Unit division: 0
Unit division: 0

## 2016-05-02 LAB — BASIC METABOLIC PANEL
ANION GAP: 11 (ref 5–15)
BUN: 47 mg/dL — ABNORMAL HIGH (ref 6–20)
CALCIUM: 7.6 mg/dL — AB (ref 8.9–10.3)
CO2: 20 mmol/L — AB (ref 22–32)
Chloride: 103 mmol/L (ref 101–111)
Creatinine, Ser: 5.24 mg/dL — ABNORMAL HIGH (ref 0.44–1.00)
GFR, EST AFRICAN AMERICAN: 11 mL/min — AB (ref >60)
GFR, EST NON AFRICAN AMERICAN: 9 mL/min — AB (ref >60)
GLUCOSE: 157 mg/dL — AB (ref 65–99)
POTASSIUM: 3.3 mmol/L — AB (ref 3.5–5.1)
Sodium: 134 mmol/L — ABNORMAL LOW (ref 135–145)

## 2016-05-02 MED ORDER — SEVELAMER CARBONATE 800 MG PO TABS
ORAL_TABLET | ORAL | Status: AC
  Administered 2016-05-02: 800 mg via ORAL
  Filled 2016-05-02: qty 1

## 2016-05-02 MED ORDER — OXYCODONE HCL 5 MG PO TABS
5.0000 mg | ORAL_TABLET | Freq: Once | ORAL | Status: AC
  Administered 2016-05-02: 5 mg via ORAL
  Filled 2016-05-02: qty 1

## 2016-05-02 MED ORDER — RIFAXIMIN 550 MG PO TABS: 550.0000 mg | ORAL_TABLET | Freq: Two times a day (BID) | ORAL | 2 refills | Status: DC

## 2016-05-02 MED ORDER — SODIUM CHLORIDE 0.9 % IV SOLN: 100.0000 mL | INTRAVENOUS | Status: DC | PRN

## 2016-05-02 NOTE — Procedures (Signed)
   HEMODIALYSIS TREATMENT NOTE:  3 hour 22 minute HD session completed via right upper arm AVF (16g/antegrade). Goal nearly met:  HD was terminated 10 minutes early due to clotting venous line.  Net UF 2.2 liters.  All blood was returned and hemostasis was achieved within 15 minutes. Report called to [place], Therapist, sports.  Rockwell [place], RN, CDN

## 2016-05-02 NOTE — Discharge Planning (Signed)
Pt being discharged home, vitals stable, no c/o pain, iv reomved, discharge paperwork sent with pt, no questions for the nurse

## 2016-05-02 NOTE — Progress Notes (Signed)
Subjective: Interval History: Patient feels somewhat better. Her abdominal tightness has improved. She denies any difficulty breathing. Objective: Vital signs in last 24 hours: Temp:  [97.8 F (36.6 C)-98.2 F (36.8 C)] 98.1 F (36.7 C) (09/23 0555) Pulse Rate:  [60-75] 65 (09/23 0555) Resp:  [16-20] 16 (09/23 0555) BP: (95-118)/(46-56) 101/56 (09/23 0555) SpO2:  [100 %] 100 % (09/23 0555) Weight change:   Intake/Output from previous day: 09/22 0701 - 09/23 0700 In: 2625 [I.V.:2625] Out: -  Intake/Output this shift: No intake/output data recorded.  General appearance: alert, cooperative and no distress Resp: clear to auscultation bilaterally Cardio: regular rate and rhythm GI: Abdomen is distended, nontender. Extremities: edema She has 1+ edema bilaterally  Lab Results:  Recent Labs  04/30/16 0436 04/30/16 1543 05/01/16 0637  WBC 5.4  --  8.5  HGB 7.6* 9.7* 9.8*  HCT 23.3* 29.5* 30.5*  PLT 80*  --  95*   BMET:   Recent Labs  04/30/16 0436 05/01/16 0637  NA 140 139  K 3.9 3.5  CL 107 106  CO2 21* 23  GLUCOSE 109* 99  BUN 78* 41*  CREATININE 5.36* 3.89*  CALCIUM 7.9* 8.1*   No results for input(s): PTH in the last 72 hours. Iron Studies: No results for input(s): IRON, TIBC, TRANSFERRIN, FERRITIN in the last 72 hours.  Studies/Results: US Paracentesis  Result Date: 05/01/2016 INDICATION: Cirrhosis, ascites EXAM: ULTRASOUND GUIDED THERAPEUTIC PARACENTESIS MEDICATIONS: None. COMPLICATIONS: None immediate. PROCEDURE: Procedure, benefits, and risks of procedure were discussed with patient. Written informed consent for procedure was obtained. Time out protocol followed. Adequate collection of ascites localized by ultrasound in LEFT lower quadrant. Skin prepped and draped in usual sterile fashion. Skin and soft tissues anesthetized with 10 mL of 1% lidocaine. 5 [place] Yueh catheter placed into peritoneal cavity. 5.4 L of yellow fluid aspirated by vacuum bottle  suction. Procedure tolerated well by patient without immediate complication. FINDINGS: As above IMPRESSION: Successful ultrasound-guided paracentesis yielding 5.4 liters of peritoneal fluid. Electronically Signed   By: Lavonia Dana M.D.   On: 05/01/2016 12:25   Dg Chest Port 1 View  Result Date: 04/30/2016 CLINICAL DATA:  Rhonchi.  Former smoker. EXAM: PORTABLE CHEST 1 VIEW COMPARISON:  10/08/2015 FINDINGS: The cardiac silhouette is upper limits of normal in size, accentuated by portable AP technique as well as shallow inspiration. There is central pulmonary vascular congestion. Minimal bibasilar opacities suggest atelectasis. No overt pulmonary edema, sizable pleural effusion, or pneumothorax is identified. No acute osseous abnormality is identified. IMPRESSION: Shallow lung inflation with pulmonary vascular congestion and minimal bibasilar atelectasis. Electronically Signed   By: Logan Bores M.D.   On: 04/30/2016 08:52    I have reviewed the patient's current medications.  Assessment/Plan: Problem #1 history of GI bleeding . Patient with GI bleeding. Her hemoglobin remains stable. Presently she denies any sign of bleeding. Problem #2 end-stage renal disease: She is status post hemodialysis on Thursday. Presently her potassium is normal and she is asymptomatic. Problem #3 history of liver cirrhosis Problem #4 history of hypotension: Presently on Midodrine mainly during dialysis days. Problem #5 metabolic bone disease: Her calcium and phosphorus in the range. Problem #6 noncompliance: Including her dialysis, salt and fluid intake.  Problem #7 history of ascites: She is status post paracentesis yesterday with removal of 5.4 L. Presently she feels much better. Plan:1]  will make arrangements for patient to get dialysis today.. 2] will try to remove [id number] l if systolic blood pressure remains above 90. 3]  will continue his other treatment.   LOS: 3 days   Shanique Aslinger S 05/02/2016,8:08  AM

## 2016-05-02 NOTE — Discharge Summary (Signed)
Physician Discharge Summary  KAMBREIGH FIGUERO [id number] DOB: 1975/12/11 DOA: 04/28/2016  PCP: [name], MD  Admit date: 04/28/2016 Discharge date: 05/02/2016  Time spent: 45 minutes  Recommendations for Outpatient Follow-up:  -Will be discharged home today. -Advised to follow up with PCP in 2 weeks and with GI as scheduled.   Discharge Diagnoses:  Principal Problem:   UGIB (upper gastrointestinal bleed) Active Problems:   Malnutrition of moderate degree (HCC)   Esophageal varices without bleeding (HCC)   Thrombocytopenia (HCC)   Liver cirrhosis secondary to NASH   Rhonchi   Mallory-Weiss tear   Discharge Condition: Stable and improved  Filed Weights   04/30/16 1800 05/01/16 0500 05/02/16 1050  Weight: 66.3 kg (146 lb 2.6 oz) 69.1 kg (152 lb 5.4 oz) 63.2 kg (139 lb 5.3 oz)    History of present illness:  As per Dr. Marin Comment on 9/20: Wanda [name] is a 40 y.o. female with medical history significant of liver cirrhosis (NASH), ESRD, and GERD presents to the ED with complaints of black and tarry stool that onset Monday night. Pt reports that her last BM was early Tuesday morning with the same appearance as before. She also reports painful urination that has been present for a couple of weeks. She usually has dialysis on Tuesday, Thursday, and Friday, but she missed her recent Tuesday appointment.  She had EGD which showed varices, and they were banded in the past. For her ascites, she has been getting LVP about once a week thru IR. She also has a fistula in her upper right arm for her dialysis.  EDP had spoken with her GI Dr Oneida Alar, and she recommended overnight admit for observation.  Hospitalist was asked to admit her for same.    ED Course: While in the Ed, sodium 131, chloride 94, BUN 120, Cr 10.85, calcium 8.8, albumin 3.0, Lipase 188, AST 55, platelets 132, and Hgb 8.7. FOBT negative.   Hospital Course:   UGIB -EGD with [facility]. -Continue PPI BID. -No further  signs of bleeding. -Appreciate GI input and recommendations.  Acute Blood Loss Anemia -On top of anemia of chronic disease. -Hb dropped to 6.4 on 9/20 and increased to 8.7 after transfusion of 2 units of PRBCs. -On 9/21, Hb has further decreased to 7.6 and will receive another unit of PRBCs as ordered by GI. -On 9/22 Hb stable at 9.8. Up to 10 on DC.  ESRD -Followed by nephrology. -Has been receiving HD in the hospital. -BP has been low, likely a combination of acute blood loss from GIB on top of chronic hypotension from cirrhosis.  NASH -S/p therapeutic paracentesis on 9/23.  Thrombocytopenia -Due to cirrhosis.  Procedures:  Paracentesis 9/23 yielding 5.4 L   Consultations:  GI  Discharge Instructions  Discharge Instructions    Diet - low sodium heart healthy    Complete by:  As directed    Increase activity slowly    Complete by:  As directed        Medication List    STOP taking these medications   sucralfate 1 GM/10ML suspension Commonly known as:  CARAFATE     TAKE these medications   clotrimazole 1 % cream Commonly known as:  LOTRIMIN APPLY TOPICALLY THREE TIMES DAILY FOR 10 DAYS   Darbepoetin Alfa 60 MCG/0.3ML Sosy injection Commonly known as:  ARANESP Inject 0.3 mLs (60 mcg total) into the vein every Thursday with hemodialysis.   lactulose 10 GM/15ML solution Commonly known as:  CHRONULAC  TAKE 3 TEASPOONSFUL (15ML) BY MOUTH TWICE DAILY What changed:  See the new instructions.   lidocaine 2 % solution Commonly known as:  XYLOCAINE TAKE TWO TEASPOONSFUL (10ML) BY MOUTH BEFORE MEALS AND AT BEDTIME TO PREVENT CHEST PAIN WHILE EATING   lidocaine-prilocaine cream Commonly known as:  EMLA Apply 1 application topically as needed (for dialysis treatments).   LORazepam 1 MG tablet Commonly known as:  ATIVAN TAKE 1/2 TO 1 TABLET BY MOUTH TWICE DAILY AS NEEDED FOR BACK SPASM OR FOR ANXIETY   midodrine 10 MG tablet Commonly known as:   PROAMATINE Take 1 tablet (10 mg total) by mouth daily. *Takes only on dialysis days on Tuesdays, Thursdays, and Saturdays (Sometimes takes treatments on Friday instead of Saturday)**Medication to keep your blood pressure from falling. What changed:  when to take this  additional instructions   Oxycodone HCl 10 MG Tabs 1 PO TID AS NEEDED FOR PAIN What changed:  how much to take  how to take this  when to take this  reasons to take this  additional instructions   pantoprazole 40 MG tablet Commonly known as:  PROTONIX Take 1 tablet (40 mg total) by mouth 2 (two) times daily. What changed:  when to take this  reasons to take this   promethazine 12.5 MG tablet Commonly known as:  PHENERGAN TAKE ONE TABLET BY MOUTH EVERY 6 HOURS AS NEEDED FOR NAUSEA AND VOMITING.   RENVELA 800 MG tablet Generic drug:  sevelamer carbonate 1 TABLET 3 TIMES A DAY WITH MEALS   rifaximin 550 MG Tabs tablet Commonly known as:  XIFAXAN Take 1 tablet (550 mg total) by mouth 2 (two) times daily before a meal.      Allergies  Allergen Reactions  . Lasix [Furosemide]     "doesn't work"  . Latex Itching  . Morphine And Related Hives and Itching   Follow-up Information    [name], MD. Schedule an appointment as soon as possible for a visit in 3 week(s).   Specialty:  Nurse Practitioner Contact information: [address] [place] [id number] [phone]            The results of significant diagnostics from this hospitalization (including imaging, microbiology, ancillary and laboratory) are listed below for reference.    Significant Diagnostic Studies: US Abdomen Limited  Result Date: 04/29/2016 CLINICAL DATA:  Cirrhosis, ascites EXAM: LIMITED ABDOMEN ULTRASOUND FOR ASCITES TECHNIQUE: Limited ultrasound survey for ascites was performed in all four abdominal quadrants. COMPARISON:  04/21/2016 FINDINGS: Scattered ascites is identified in all 4 quadrants greater in LEFT lower  quadrant. IMPRESSION: Scattered ascites throughout abdomen. Electronically Signed   By: Lavonia Dana M.D.   On: 04/29/2016 10:29   US Paracentesis  Result Date: 05/01/2016 INDICATION: Cirrhosis, ascites EXAM: ULTRASOUND GUIDED THERAPEUTIC PARACENTESIS MEDICATIONS: None. COMPLICATIONS: None immediate. PROCEDURE: Procedure, benefits, and risks of procedure were discussed with patient. Written informed consent for procedure was obtained. Time out protocol followed. Adequate collection of ascites localized by ultrasound in LEFT lower quadrant. Skin prepped and draped in usual sterile fashion. Skin and soft tissues anesthetized with 10 mL of 1% lidocaine. 5 [place] Yueh catheter placed into peritoneal cavity. 5.4 L of yellow fluid aspirated by vacuum bottle suction. Procedure tolerated well by patient without immediate complication. FINDINGS: As above IMPRESSION: Successful ultrasound-guided paracentesis yielding 5.4 liters of peritoneal fluid. Electronically Signed   By: Lavonia Dana M.D.   On: 05/01/2016 12:25   US Paracentesis  Result Date: 04/21/2016 INDICATION: Cirrhosis due  to NASH, ascites EXAM: ULTRASOUND GUIDED THERAPEUTIC PARACENTESIS MEDICATIONS: None. COMPLICATIONS: None immediate. PROCEDURE: Procedure, benefits, and risks of procedure were discussed with patient. Written informed consent for procedure was obtained. Time out protocol followed. Adequate collection of ascites localized by ultrasound in LEFT lower quadrant. Skin prepped and draped in usual sterile fashion. Skin and soft tissues anesthetized with 10 mL of 1% lidocaine. 5 [place] Yueh catheter placed into peritoneal cavity. 2.6 L of cloudy yellow fluid aspirated by vacuum bottle suction. Procedure tolerated well by patient without immediate complication. FINDINGS: As above IMPRESSION: Successful ultrasound-guided paracentesis yielding 2.6 liters of peritoneal fluid. Electronically Signed   By: Lavonia Dana M.D.   On: 04/21/2016 14:31   US  Paracentesis  Result Date: 04/15/2016 INDICATION: Ascites EXAM: ULTRASOUND GUIDED right PARACENTESIS MEDICATIONS: None. COMPLICATIONS: None immediate. PROCEDURE: Informed written consent was obtained from the patient after a discussion of the risks, benefits and alternatives to treatment. A timeout was performed prior to the initiation of the procedure. Initial ultrasound scanning demonstrates a small amount of ascites within the right lower abdominal quadrant. The right lower abdomen was prepped and draped in the usual sterile fashion. 1% lidocaine with epinephrine was used for local anesthesia. Following this, a 19 gauge, 7-cm, Yueh catheter was introduced. An ultrasound image was saved for documentation purposes. The paracentesis was performed. The catheter was removed and a dressing was applied. The patient tolerated the procedure well without immediate post procedural complication. FINDINGS: A total of approximately 3.3 L of straw-colored fluid was removed. IMPRESSION: Successful ultrasound-guided paracentesis yielding 3.3 liters of peritoneal fluid. Electronically Signed   By: Julian Hy M.D.   On: 04/15/2016 16:03   US Paracentesis  Result Date: 04/09/2016 INDICATION: Cirrhosis EXAM: ULTRASOUND GUIDED THERAPEUTIC PARACENTESIS MEDICATIONS: None. COMPLICATIONS: None immediate. PROCEDURE: Procedure, benefits, and risks of procedure were discussed with patient. Written informed consent for procedure was obtained. Time out protocol followed. Adequate collection of ascites localized by ultrasound in LEFT lower quadrant. Skin prepped and draped in usual sterile fashion. Skin and soft tissues anesthetized with 10 mL of 1% lidocaine. 5 [place] Yueh catheter placed into peritoneal cavity. 4.6 L of yellow colored fluid aspirated by vacuum bottle suction. Procedure tolerated well by patient without immediate complication. FINDINGS: As above IMPRESSION: Successful ultrasound-guided paracentesis yielding 4.6  liters of peritoneal fluid. Electronically Signed   By: Lavonia Dana M.D.   On: 04/09/2016 15:27   Dg Chest Port 1 View  Result Date: 04/30/2016 CLINICAL DATA:  Rhonchi.  Former smoker. EXAM: PORTABLE CHEST 1 VIEW COMPARISON:  10/08/2015 FINDINGS: The cardiac silhouette is upper limits of normal in size, accentuated by portable AP technique as well as shallow inspiration. There is central pulmonary vascular congestion. Minimal bibasilar opacities suggest atelectasis. No overt pulmonary edema, sizable pleural effusion, or pneumothorax is identified. No acute osseous abnormality is identified. IMPRESSION: Shallow lung inflation with pulmonary vascular congestion and minimal bibasilar atelectasis. Electronically Signed   By: Logan Bores M.D.   On: 04/30/2016 08:52    Microbiology: Recent Results (from the past 240 hour(s))  MRSA PCR Screening     Status: None   Collection Time: 04/29/16  2:30 AM  Result Value Ref Range Status   MRSA by PCR NEGATIVE NEGATIVE Final    Comment:        The GeneXpert MRSA Assay (FDA approved for NASAL specimens only), is one component of a comprehensive MRSA colonization surveillance program. It is not intended to diagnose MRSA infection nor to guide or  monitor treatment for MRSA infections.      Labs: Basic Metabolic Panel:  Recent Labs Lab 04/28/16 2210 04/30/16 0436 05/01/16 0637 05/02/16 0936  NA 131* 140 139 134*  135  K 4.0 3.9 3.5 3.3*  3.3*  CL 94* 107 106 103  103  CO2 16* 21* 23 20*  20*  GLUCOSE 91 109* 99 157*  158*  BUN 120* 78* 41* 47*  48*  CREATININE 10.85* 5.36* 3.89* 5.24*  5.26*  CALCIUM 8.8* 7.9* 8.1* 7.6*  7.7*  PHOS  --   --  4.3 5.9*   Liver Function Tests:  Recent Labs Lab 04/28/16 2210 05/01/16 0637 05/02/16 0936  AST 55*  --  46*  ALT 31  --  24  ALKPHOS 104  --  86  BILITOT 1.0  --  0.8  PROT 6.6  --  4.8*  ALBUMIN 3.0* 2.6* 2.3*  2.4*    Recent Labs Lab 04/28/16 2210  LIPASE 188*   No  results for input(s): AMMONIA in the last 168 hours. CBC:  Recent Labs Lab 04/28/16 2210  04/29/16 1738 04/29/16 2044 04/30/16 0436 04/30/16 1543 05/01/16 0637 05/02/16 0936  WBC 9.4  < > 5.8 5.6 5.4  --  8.5 7.7  NEUTROABS 6.8  --   --   --   --   --   --   --   HGB 8.7*  < > 8.7* 8.0* 7.6* 9.7* 9.8* 10.0*  HCT 26.7*  < > 25.9* 23.9* 23.3* 29.5* 30.5* 31.5*  MCV 102.7*  < > 95.6 96.0 98.3  --  98.1 101.0*  PLT 132*  < > 86* 71* 80*  --  95* 97*  < > = values in this interval not displayed. Cardiac Enzymes: No results for input(s): CKTOTAL, CKMB, CKMBINDEX, TROPONINI in the last 168 hours. BNP: BNP (last 3 results) No results for input(s): BNP in the last 8760 hours.  ProBNP (last 3 results) No results for input(s): PROBNP in the last 8760 hours.  CBG: No results for input(s): GLUCAP in the last 168 hours.     SignedLelon Frohlich  [facility] Pager: [phone] 05/02/2016, 4:29 PM

## 2016-05-05 ENCOUNTER — Encounter (HOSPITAL_COMMUNITY): Payer: Self-pay | Admitting: Internal Medicine

## 2016-05-06 ENCOUNTER — Ambulatory Visit (HOSPITAL_COMMUNITY)
Admission: RE | Admit: 2016-05-06 | Discharge: 2016-05-06 | Disposition: A | Discharge status: Home / Self Care | Payer: Medicare Other | Source: Ambulatory Visit | Attending: Gastroenterology | Admitting: Gastroenterology

## 2016-05-06 ENCOUNTER — Other Ambulatory Visit: Payer: Self-pay | Admitting: Nurse Practitioner

## 2016-05-06 DIAGNOSIS — R188 Other ascites: Secondary | ICD-10-CM | POA: Insufficient documentation

## 2016-05-06 MED ORDER — ALBUMIN HUMAN 25 % IV SOLN
INTRAVENOUS | Status: AC
  Administered 2016-05-06: 25 g via INTRAVENOUS
  Filled 2016-05-06: qty 50

## 2016-05-06 NOTE — Sedation Documentation (Signed)
MD at bedside. 

## 2016-05-06 NOTE — Sedation Documentation (Signed)
Patient denies pain and is resting comfortably.  

## 2016-05-06 NOTE — Sedation Documentation (Signed)
Family updated as to patient's status. At bedside

## 2016-05-06 NOTE — Sedation Documentation (Signed)
Vital signs stable. 100/51 HR 102 RR 16

## 2016-05-09 IMAGING — DX DG CHEST 2V
2 series · 2 of 2 positions shown · non-contrast
Comparison: 06/04/2015

CLINICAL DATA: Productive cough off and on for 1 month, history of
pneumonia, former smoker, cirrhosis, end-stage renal disease on
dialysis

EXAM:
CHEST  2 VIEW

[chest pa]
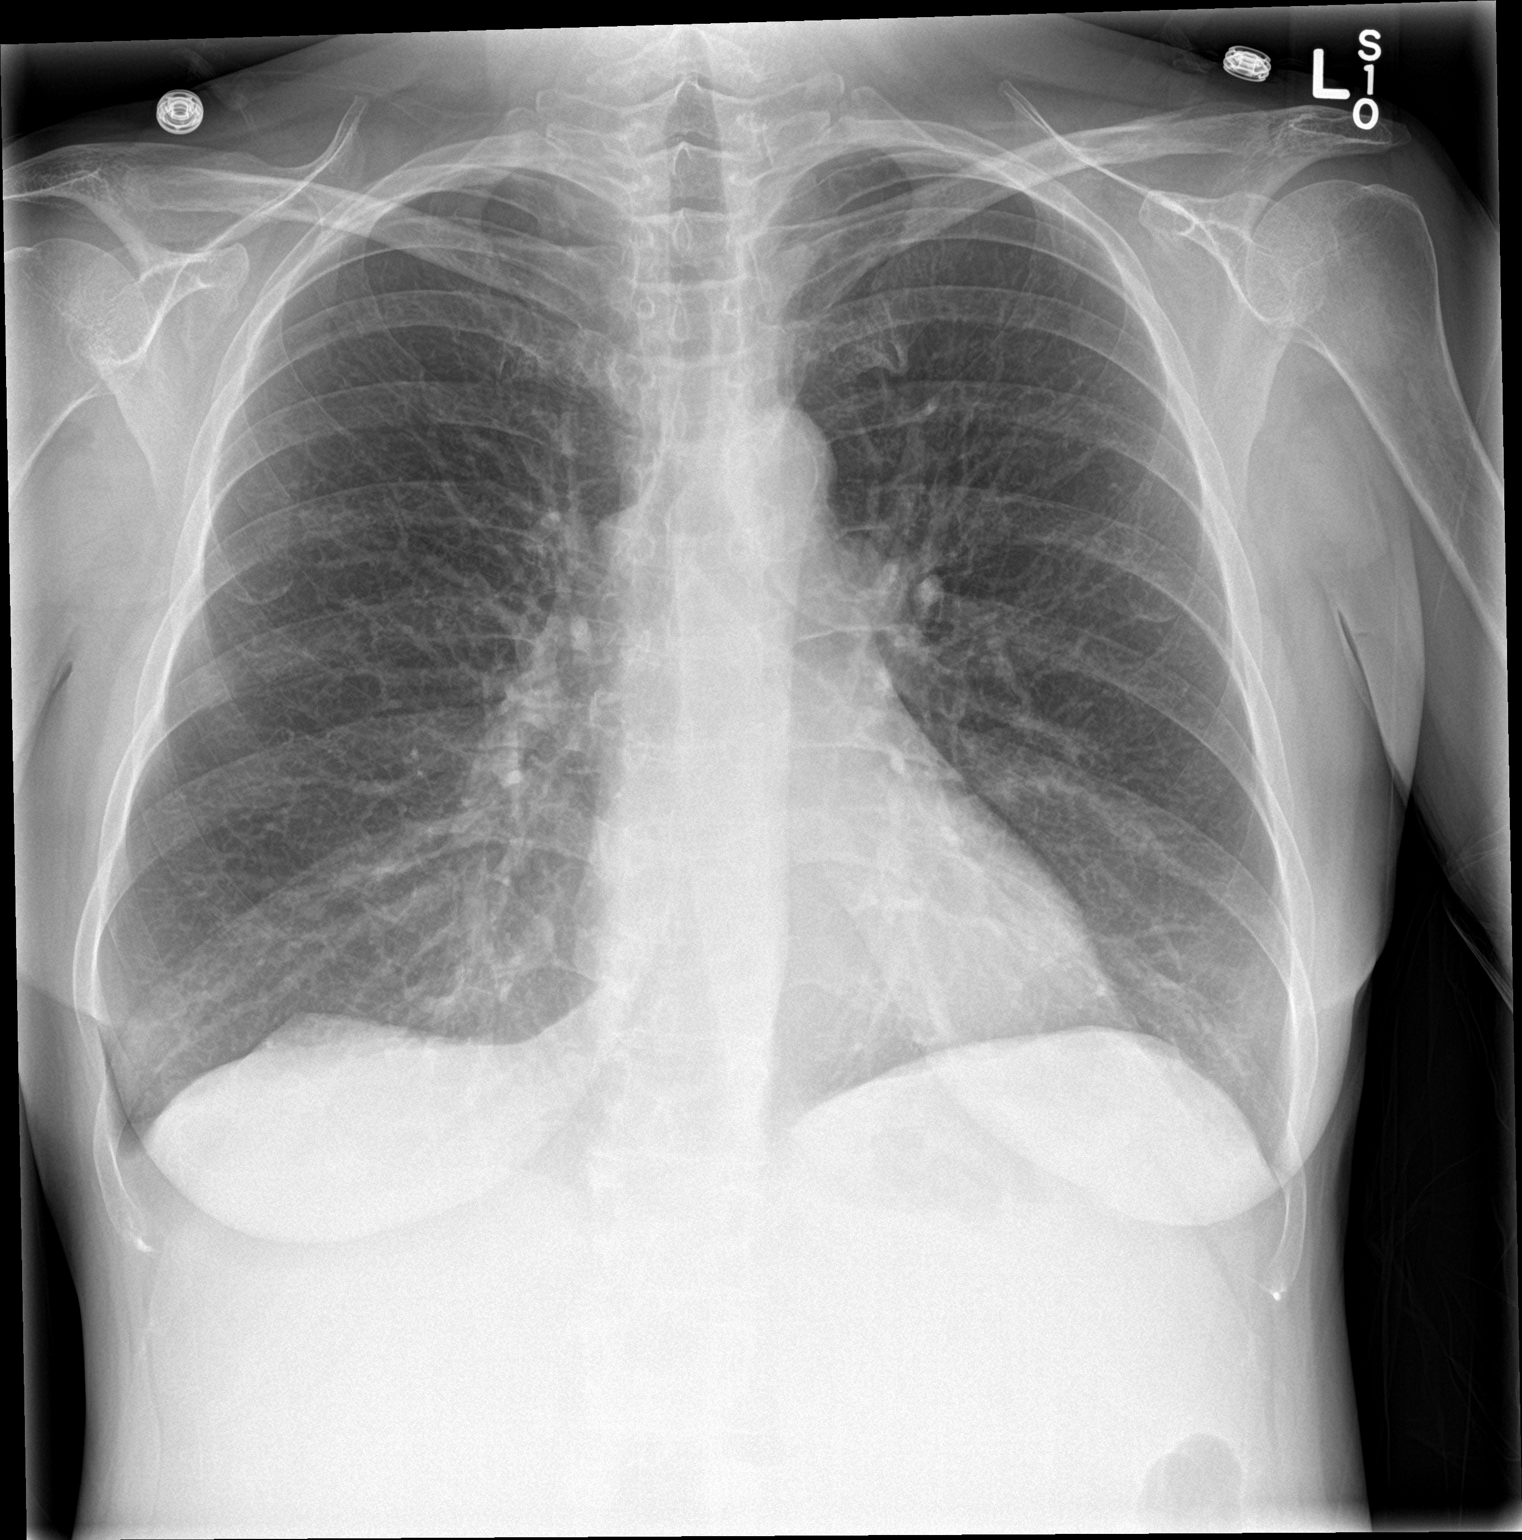

[chest lat]
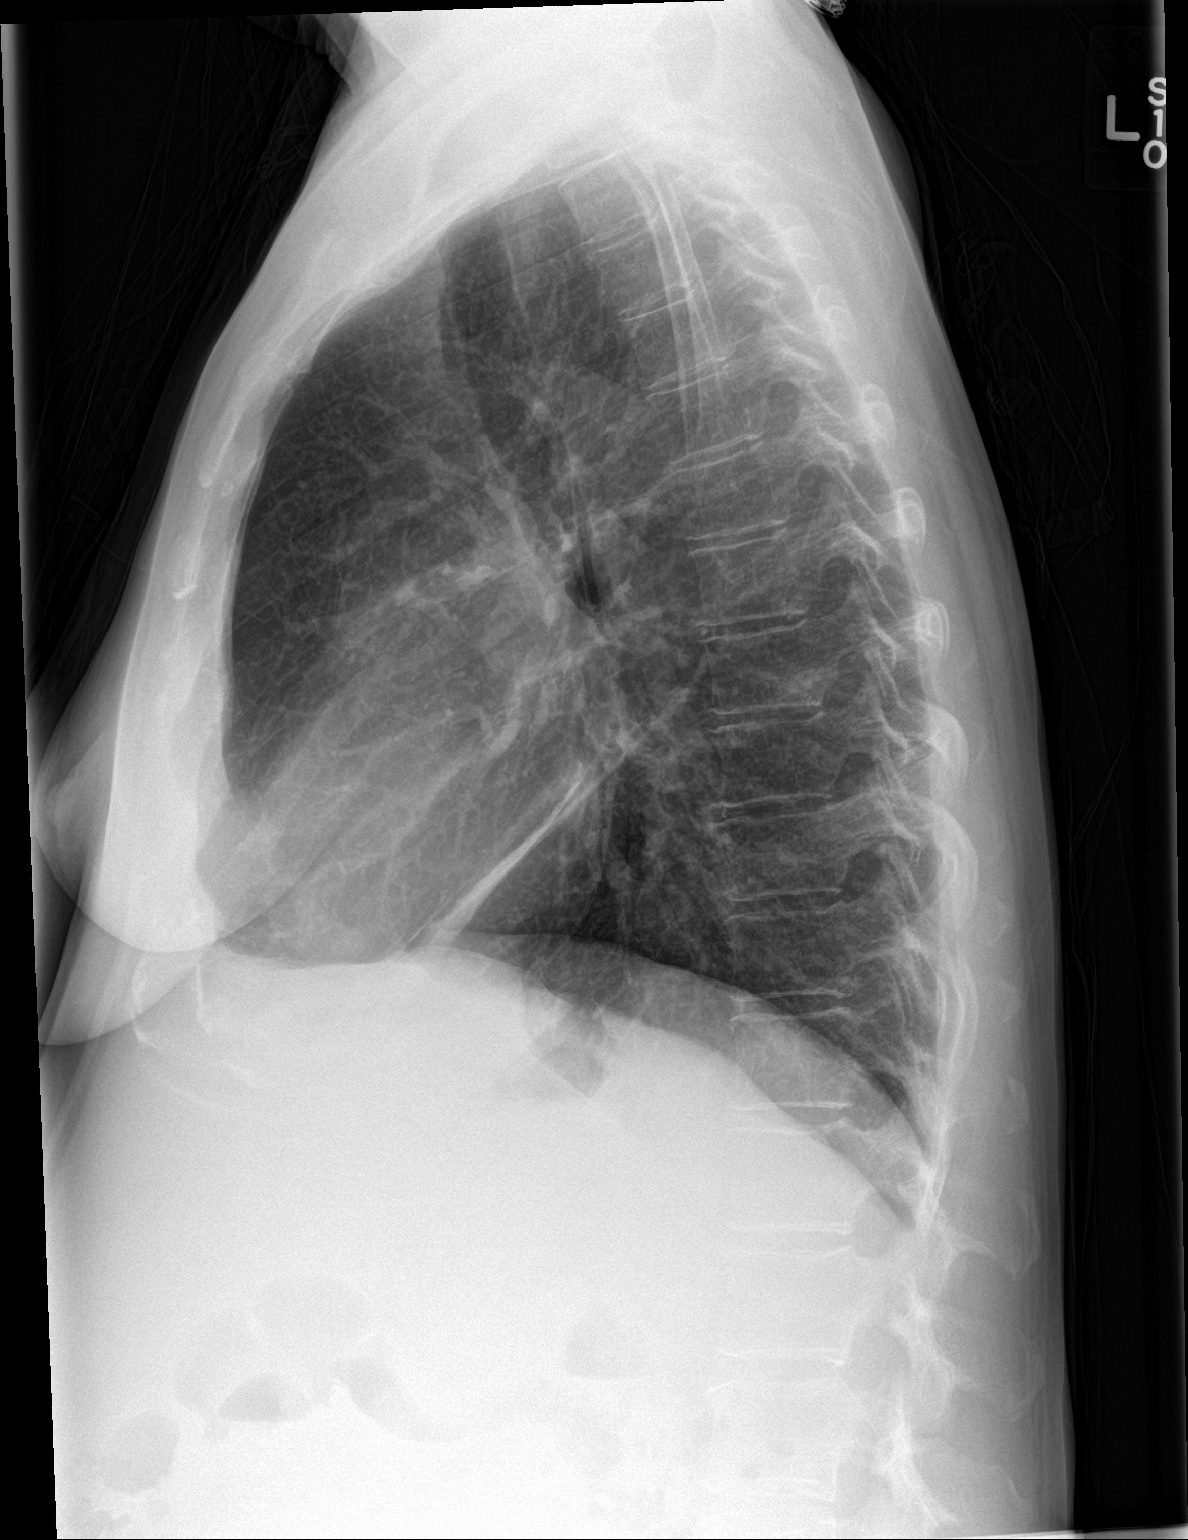

[2 of 2 positions shown; findings below may reference images not displayed]

FINDINGS: Normal heart size, mediastinal contours, and pulmonary vascularity.

Atherosclerotic calcification aorta.

Minimal thickening or fluid at base of major fissure on lateral
view.

Lungs otherwise clear.

No additional pleural effusion or pneumothorax.

Bones unremarkable.
IMPRESSION: No significant abnormalities.

## 2016-05-13 ENCOUNTER — Ambulatory Visit (HOSPITAL_COMMUNITY)
Admission: RE | Admit: 2016-05-13 | Discharge: 2016-05-13 | Disposition: A | Discharge status: Home / Self Care | Payer: Medicare Other | Source: Ambulatory Visit | Attending: Gastroenterology | Admitting: Gastroenterology

## 2016-05-13 ENCOUNTER — Encounter (HOSPITAL_COMMUNITY): Payer: Self-pay

## 2016-05-13 DIAGNOSIS — K7460 Unspecified cirrhosis of liver: Secondary | ICD-10-CM | POA: Diagnosis not present

## 2016-05-13 DIAGNOSIS — R188 Other ascites: Secondary | ICD-10-CM

## 2016-05-13 DIAGNOSIS — K7011 Alcoholic hepatitis with ascites: Secondary | ICD-10-CM

## 2016-05-13 MED ORDER — SODIUM CHLORIDE 0.9% FLUSH
INTRAVENOUS | Status: AC
  Administered 2016-05-13: 20 mL
  Filled 2016-05-13: qty 30

## 2016-05-13 MED ORDER — ALBUMIN HUMAN 25 % IV SOLN
25.0000 g | Freq: Once | INTRAVENOUS | Status: AC
  Administered 2016-05-13: 25 g via INTRAVENOUS

## 2016-05-13 MED ORDER — ALBUMIN HUMAN 25 % IV SOLN
INTRAVENOUS | Status: AC
  Filled 2016-05-13: qty 100

## 2016-05-13 NOTE — Sedation Documentation (Signed)
3.5L of clear yellow fluid drawn from abdomen during paracentesis procedure. PT tolerated procedure well and denies any SOB or pain at this time.

## 2016-05-13 NOTE — Procedures (Signed)
PreOperative Dx: Cirrhosis, ascites ?Postoperative Dx: Cirrhosis, ascites ?Procedure:   US guided paracentesis ?Radiologist:  Wilbern Pennypacker ?Anesthesia:  10 ml of1% lidocaine ?Specimen:  3.5 L of clear yellow ascitic fluid ?EBL:   < 1 ml ?Complications: None   ?

## 2016-05-19 ENCOUNTER — Encounter (HOSPITAL_COMMUNITY): Payer: Self-pay | Admitting: Emergency Medicine

## 2016-05-19 ENCOUNTER — Emergency Department (HOSPITAL_COMMUNITY): Payer: Medicare Other

## 2016-05-19 ENCOUNTER — Inpatient Hospital Stay (HOSPITAL_COMMUNITY)
Admission: EM | Admit: 2016-05-19 | Discharge: 2016-05-27 | DRG: 380 | Disposition: A | Discharge status: Home Health | Payer: Medicare Other | Attending: Internal Medicine | Admitting: Internal Medicine

## 2016-05-19 DIAGNOSIS — Z9115 Patient's noncompliance with renal dialysis: Secondary | ICD-10-CM | POA: Diagnosis not present

## 2016-05-19 DIAGNOSIS — K219 Gastro-esophageal reflux disease without esophagitis: Secondary | ICD-10-CM | POA: Diagnosis not present

## 2016-05-19 DIAGNOSIS — N186 End stage renal disease: Secondary | ICD-10-CM

## 2016-05-19 DIAGNOSIS — K2211 Ulcer of esophagus with bleeding: Secondary | ICD-10-CM | POA: Diagnosis not present

## 2016-05-19 DIAGNOSIS — K766 Portal hypertension: Secondary | ICD-10-CM | POA: Diagnosis not present

## 2016-05-19 DIAGNOSIS — G9341 Metabolic encephalopathy: Secondary | ICD-10-CM | POA: Diagnosis present

## 2016-05-19 DIAGNOSIS — L8990 Pressure ulcer of unspecified site, unspecified stage: Secondary | ICD-10-CM | POA: Insufficient documentation

## 2016-05-19 DIAGNOSIS — R402142 Coma scale, eyes open, spontaneous, at arrival to emergency department: Secondary | ICD-10-CM | POA: Diagnosis present

## 2016-05-19 DIAGNOSIS — R402252 Coma scale, best verbal response, oriented, at arrival to emergency department: Secondary | ICD-10-CM | POA: Diagnosis present

## 2016-05-19 DIAGNOSIS — I9589 Other hypotension: Secondary | ICD-10-CM | POA: Diagnosis present

## 2016-05-19 DIAGNOSIS — J9601 Acute respiratory failure with hypoxia: Secondary | ICD-10-CM | POA: Diagnosis present

## 2016-05-19 DIAGNOSIS — K7290 Hepatic failure, unspecified without coma: Secondary | ICD-10-CM | POA: Diagnosis present

## 2016-05-19 DIAGNOSIS — E46 Unspecified protein-calorie malnutrition: Secondary | ICD-10-CM | POA: Diagnosis not present

## 2016-05-19 DIAGNOSIS — E875 Hyperkalemia: Secondary | ICD-10-CM | POA: Diagnosis present

## 2016-05-19 DIAGNOSIS — R188 Other ascites: Secondary | ICD-10-CM

## 2016-05-19 DIAGNOSIS — E872 Acidosis: Secondary | ICD-10-CM | POA: Diagnosis present

## 2016-05-19 DIAGNOSIS — K226 Gastro-esophageal laceration-hemorrhage syndrome: Secondary | ICD-10-CM | POA: Diagnosis not present

## 2016-05-19 DIAGNOSIS — Z6828 Body mass index (BMI) 28.0-28.9, adult: Secondary | ICD-10-CM

## 2016-05-19 DIAGNOSIS — Z87891 Personal history of nicotine dependence: Secondary | ICD-10-CM | POA: Diagnosis not present

## 2016-05-19 DIAGNOSIS — R1084 Generalized abdominal pain: Secondary | ICD-10-CM

## 2016-05-19 DIAGNOSIS — R5383 Other fatigue: Secondary | ICD-10-CM

## 2016-05-19 DIAGNOSIS — Z452 Encounter for adjustment and management of vascular access device: Secondary | ICD-10-CM

## 2016-05-19 DIAGNOSIS — K922 Gastrointestinal hemorrhage, unspecified: Secondary | ICD-10-CM | POA: Diagnosis not present

## 2016-05-19 DIAGNOSIS — K2960 Other gastritis without bleeding: Secondary | ICD-10-CM | POA: Diagnosis not present

## 2016-05-19 DIAGNOSIS — R262 Difficulty in walking, not elsewhere classified: Secondary | ICD-10-CM

## 2016-05-19 DIAGNOSIS — R402362 Coma scale, best motor response, obeys commands, at arrival to emergency department: Secondary | ICD-10-CM | POA: Diagnosis present

## 2016-05-19 DIAGNOSIS — K7581 Nonalcoholic steatohepatitis (NASH): Secondary | ICD-10-CM | POA: Diagnosis present

## 2016-05-19 DIAGNOSIS — K7460 Unspecified cirrhosis of liver: Secondary | ICD-10-CM | POA: Diagnosis not present

## 2016-05-19 DIAGNOSIS — F319 Bipolar disorder, unspecified: Secondary | ICD-10-CM | POA: Diagnosis present

## 2016-05-19 DIAGNOSIS — A419 Sepsis, unspecified organism: Secondary | ICD-10-CM

## 2016-05-19 DIAGNOSIS — D649 Anemia, unspecified: Secondary | ICD-10-CM

## 2016-05-19 DIAGNOSIS — K7031 Alcoholic cirrhosis of liver with ascites: Secondary | ICD-10-CM | POA: Diagnosis not present

## 2016-05-19 DIAGNOSIS — R578 Other shock: Secondary | ICD-10-CM | POA: Diagnosis not present

## 2016-05-19 DIAGNOSIS — Z8719 Personal history of other diseases of the digestive system: Secondary | ICD-10-CM | POA: Diagnosis not present

## 2016-05-19 DIAGNOSIS — E878 Other disorders of electrolyte and fluid balance, not elsewhere classified: Secondary | ICD-10-CM | POA: Diagnosis not present

## 2016-05-19 DIAGNOSIS — D62 Acute posthemorrhagic anemia: Secondary | ICD-10-CM | POA: Diagnosis present

## 2016-05-19 DIAGNOSIS — D638 Anemia in other chronic diseases classified elsewhere: Secondary | ICD-10-CM | POA: Diagnosis present

## 2016-05-19 DIAGNOSIS — E871 Hypo-osmolality and hyponatremia: Secondary | ICD-10-CM | POA: Diagnosis present

## 2016-05-19 DIAGNOSIS — R579 Shock, unspecified: Secondary | ICD-10-CM | POA: Diagnosis not present

## 2016-05-19 DIAGNOSIS — Z992 Dependence on renal dialysis: Secondary | ICD-10-CM

## 2016-05-19 DIAGNOSIS — R079 Chest pain, unspecified: Secondary | ICD-10-CM

## 2016-05-19 DIAGNOSIS — K921 Melena: Secondary | ICD-10-CM | POA: Diagnosis not present

## 2016-05-19 DIAGNOSIS — R109 Unspecified abdominal pain: Secondary | ICD-10-CM

## 2016-05-19 DIAGNOSIS — D696 Thrombocytopenia, unspecified: Secondary | ICD-10-CM | POA: Diagnosis present

## 2016-05-19 DIAGNOSIS — R531 Weakness: Secondary | ICD-10-CM

## 2016-05-19 DIAGNOSIS — K3189 Other diseases of stomach and duodenum: Secondary | ICD-10-CM | POA: Diagnosis present

## 2016-05-19 LAB — CBC WITH DIFFERENTIAL/PLATELET
BASOS ABS: 0.2 10*3/uL — AB (ref 0.0–0.1)
BASOS ABS: 0.2 10*3/uL — AB (ref 0.0–0.1)
BASOS PCT: 1 %
BASOS PCT: 0 %
Basophils Absolute: 0 10*3/uL (ref 0.0–0.1)
Basophils Relative: 1 %
EOS ABS: 0.2 10*3/uL (ref 0.0–0.7)
EOS ABS: 0.2 10*3/uL (ref 0.0–0.7)
EOS PCT: 1 %
Eosinophils Absolute: 0.2 10*3/uL (ref 0.0–0.7)
Eosinophils Relative: 1 %
Eosinophils Relative: 1 %
HCT: 19 % — ABNORMAL LOW (ref 36.0–46.0)
HEMATOCRIT: 14 % — AB (ref 36.0–46.0)
HEMATOCRIT: 13.1 % — AB (ref 36.0–46.0)
HEMOGLOBIN: 3.9 g/dL — AB (ref 12.0–15.0)
HEMOGLOBIN: 6.2 g/dL — AB (ref 12.0–15.0)
Hemoglobin: 4.4 g/dL — CL (ref 12.0–15.0)
LYMPHS ABS: 1.7 10*3/uL (ref 0.7–4.0)
LYMPHS ABS: 1.7 10*3/uL (ref 0.7–4.0)
LYMPHS PCT: 10 %
Lymphocytes Relative: 9 %
Lymphocytes Relative: 9 %
Lymphs Abs: 2 10*3/uL (ref 0.7–4.0)
MCH: 32.6 pg (ref 26.0–34.0)
MCH: 32.5 pg (ref 26.0–34.0)
MCH: 30.5 pg (ref 26.0–34.0)
MCHC: 31.4 g/dL (ref 30.0–36.0)
MCHC: 29.8 g/dL — AB (ref 30.0–36.0)
MCHC: 32.6 g/dL (ref 30.0–36.0)
MCV: 103.7 fL — AB (ref 78.0–100.0)
MCV: 109.2 fL — ABNORMAL HIGH (ref 78.0–100.0)
MCV: 93.6 fL (ref 78.0–100.0)
MONO ABS: 1.2 10*3/uL — AB (ref 0.1–1.0)
MONO ABS: 1.3 10*3/uL — AB (ref 0.1–1.0)
MONOS PCT: 7 %
Monocytes Absolute: 1.1 10*3/uL — ABNORMAL HIGH (ref 0.1–1.0)
Monocytes Relative: 6 %
Monocytes Relative: 6 %
NEUTROS ABS: 15.3 10*3/uL — AB (ref 1.7–7.7)
NEUTROS PCT: 82 %
Neutro Abs: 16.1 10*3/uL — ABNORMAL HIGH (ref 1.7–7.7)
Neutro Abs: 15.6 10*3/uL — ABNORMAL HIGH (ref 1.7–7.7)
Neutrophils Relative %: 83 %
Neutrophils Relative %: 83 %
PLATELETS: 209 10*3/uL (ref 150–400)
Platelets: 251 10*3/uL (ref 150–400)
Platelets: 227 10*3/uL (ref 150–400)
RBC: 1.35 MIL/uL — AB (ref 3.87–5.11)
RBC: 1.2 MIL/uL — ABNORMAL LOW (ref 3.87–5.11)
RBC: 2.03 MIL/uL — ABNORMAL LOW (ref 3.87–5.11)
RDW: 21.2 % — AB (ref 11.5–15.5)
RDW: 20.3 % — AB (ref 11.5–15.5)
RDW: 19.1 % — ABNORMAL HIGH (ref 11.5–15.5)
WBC: 19.7 10*3/uL — AB (ref 4.0–10.5)
WBC: 18.8 10*3/uL — ABNORMAL HIGH (ref 4.0–10.5)
WBC: 18.5 10*3/uL — ABNORMAL HIGH (ref 4.0–10.5)

## 2016-05-19 LAB — BASIC METABOLIC PANEL
ANION GAP: 23 — AB (ref 5–15)
Anion gap: 19 — ABNORMAL HIGH (ref 5–15)
BUN: 209 mg/dL — AB (ref 6–20)
BUN: 181 mg/dL — AB (ref 6–20)
CHLORIDE: 93 mmol/L — AB (ref 101–111)
CHLORIDE: 93 mmol/L — AB (ref 101–111)
CO2: 11 mmol/L — ABNORMAL LOW (ref 22–32)
CO2: 15 mmol/L — ABNORMAL LOW (ref 22–32)
CREATININE: 10.65 mg/dL — AB (ref 0.44–1.00)
Calcium: 8.1 mg/dL — ABNORMAL LOW (ref 8.9–10.3)
Calcium: 8.2 mg/dL — ABNORMAL LOW (ref 8.9–10.3)
Creatinine, Ser: 11 mg/dL — ABNORMAL HIGH (ref 0.44–1.00)
GFR calc Af Amer: 4 mL/min — ABNORMAL LOW (ref >60)
GFR calc Af Amer: 5 mL/min — ABNORMAL LOW (ref >60)
GFR calc non Af Amer: 4 mL/min — ABNORMAL LOW (ref >60)
GFR, EST NON AFRICAN AMERICAN: 4 mL/min — AB (ref >60)
Glucose, Bld: 103 mg/dL — ABNORMAL HIGH (ref 65–99)
Glucose, Bld: 125 mg/dL — ABNORMAL HIGH (ref 65–99)
POTASSIUM: 6.4 mmol/L — AB (ref 3.5–5.1)
Potassium: 7.2 mmol/L (ref 3.5–5.1)
SODIUM: 127 mmol/L — AB (ref 135–145)
SODIUM: 127 mmol/L — AB (ref 135–145)

## 2016-05-19 LAB — PREPARE RBC (CROSSMATCH)

## 2016-05-19 LAB — I-STAT CG4 LACTIC ACID, ED
LACTIC ACID, VENOUS: 5.98 mmol/L — AB (ref 0.5–1.9)
Lactic Acid, Venous: 5.39 mmol/L (ref 0.5–1.9)

## 2016-05-19 LAB — HEPATIC FUNCTION PANEL
ALT: 32 U/L (ref 14–54)
AST: 65 U/L — AB (ref 15–41)
Albumin: 2 g/dL — ABNORMAL LOW (ref 3.5–5.0)
Alkaline Phosphatase: 93 U/L (ref 38–126)
BILIRUBIN DIRECT: 0.4 mg/dL (ref 0.1–0.5)
BILIRUBIN TOTAL: 1.2 mg/dL (ref 0.3–1.2)
Indirect Bilirubin: 0.8 mg/dL (ref 0.3–0.9)
Total Protein: 4.4 g/dL — ABNORMAL LOW (ref 6.5–8.1)

## 2016-05-19 LAB — PROTIME-INR
INR: 1.36
Prothrombin Time: 16.9 seconds — ABNORMAL HIGH (ref 11.4–15.2)

## 2016-05-19 LAB — MRSA PCR SCREENING: MRSA by PCR: NEGATIVE

## 2016-05-19 LAB — LIPASE, BLOOD: LIPASE: 152 U/L — AB (ref 11–51)

## 2016-05-19 LAB — HCG, SERUM, QUALITATIVE: Preg, Serum: NEGATIVE

## 2016-05-19 LAB — MAGNESIUM: Magnesium: 3.1 mg/dL — ABNORMAL HIGH (ref 1.7–2.4)

## 2016-05-19 LAB — POC OCCULT BLOOD, ED: Fecal Occult Bld: POSITIVE — AB

## 2016-05-19 LAB — AMMONIA: Ammonia: 34 umol/L (ref 9–35)

## 2016-05-19 LAB — CBG MONITORING, ED
GLUCOSE-CAPILLARY: 105 mg/dL — AB (ref 65–99)
Glucose-Capillary: 106 mg/dL — ABNORMAL HIGH (ref 65–99)

## 2016-05-19 LAB — GLUCOSE, CAPILLARY: GLUCOSE-CAPILLARY: 117 mg/dL — AB (ref 65–99)

## 2016-05-19 LAB — TROPONIN I: Troponin I: <0.03 ng/mL (ref <0.03)

## 2016-05-19 LAB — BRAIN NATRIURETIC PEPTIDE: B Natriuretic Peptide: 95 pg/mL (ref 0.0–100.0)

## 2016-05-19 MED ORDER — ALBUTEROL SULFATE (2.5 MG/3ML) 0.083% IN NEBU
10.0000 mg | INHALATION_SOLUTION | Freq: Once | RESPIRATORY_TRACT | Status: AC
  Administered 2016-05-19: 10 mg via RESPIRATORY_TRACT
  Filled 2016-05-19: qty 12

## 2016-05-19 MED ORDER — SODIUM CHLORIDE 0.9 % IV SOLN
80.0000 mg | Freq: Once | INTRAVENOUS | Status: AC
  Administered 2016-05-19: 80 mg via INTRAVENOUS
  Filled 2016-05-19: qty 80

## 2016-05-19 MED ORDER — DEXTROSE 50 % IV SOLN
1.0000 | Freq: Once | INTRAVENOUS | Status: AC
  Administered 2016-05-19: 50 mL via INTRAVENOUS

## 2016-05-19 MED ORDER — SODIUM CHLORIDE 0.9 % IV BOLUS (SEPSIS)
1000.0000 mL | Freq: Once | INTRAVENOUS | Status: DC
  Administered 2016-05-19: 1000 mL via INTRAVENOUS

## 2016-05-19 MED ORDER — SODIUM CHLORIDE 0.9 % IV SOLN: 250.0000 mL | INTRAVENOUS | Status: DC | PRN

## 2016-05-19 MED ORDER — SODIUM BICARBONATE 8.4 % IV SOLN
50.0000 meq | Freq: Once | INTRAVENOUS | Status: AC
  Administered 2016-05-19: 50 meq via INTRAVENOUS
  Filled 2016-05-19: qty 50

## 2016-05-19 MED ORDER — DEXTROSE 50 % IV SOLN
INTRAVENOUS | Status: AC
  Filled 2016-05-19: qty 50

## 2016-05-19 MED ORDER — PIPERACILLIN-TAZOBACTAM 3.375 G IVPB 30 MIN
3.3750 g | Freq: Once | INTRAVENOUS | Status: AC
  Administered 2016-05-19: 3.375 g via INTRAVENOUS
  Filled 2016-05-19: qty 50

## 2016-05-19 MED ORDER — SODIUM CHLORIDE 0.9 % IV SOLN
10.0000 mL/h | Freq: Once | INTRAVENOUS | Status: AC
  Administered 2016-05-21: 07:00:00 via INTRAVENOUS

## 2016-05-19 MED ORDER — DEXTROSE 10 % IV SOLN: Freq: Once | INTRAVENOUS | Status: DC

## 2016-05-19 MED ORDER — SODIUM CHLORIDE 0.9 % IV SOLN
1.0000 g | Freq: Once | INTRAVENOUS | Status: AC
  Administered 2016-05-20: 1 g via INTRAVENOUS
  Filled 2016-05-19: qty 10

## 2016-05-19 MED ORDER — ONDANSETRON HCL 4 MG/2ML IJ SOLN
4.0000 mg | Freq: Once | INTRAMUSCULAR | Status: AC
  Administered 2016-05-19: 4 mg via INTRAVENOUS
  Filled 2016-05-19: qty 2

## 2016-05-19 MED ORDER — VANCOMYCIN HCL IN DEXTROSE 1-5 GM/200ML-% IV SOLN
1000.0000 mg | Freq: Once | INTRAVENOUS | Status: AC
  Administered 2016-05-19: 1000 mg via INTRAVENOUS
  Filled 2016-05-19: qty 200

## 2016-05-19 MED ORDER — SODIUM CHLORIDE 0.9% FLUSH
3.0000 mL | Freq: Two times a day (BID) | INTRAVENOUS | Status: DC
  Administered 2016-05-19 – 2016-05-20 (×3): 3 mL via INTRAVENOUS

## 2016-05-19 MED ORDER — SODIUM CHLORIDE 0.9 % IV SOLN
50.0000 ug/h | INTRAVENOUS | Status: DC
  Administered 2016-05-19 – 2016-05-21 (×4): 50 ug/h via INTRAVENOUS
  Filled 2016-05-19 (×9): qty 1

## 2016-05-19 MED ORDER — INSULIN ASPART 100 UNIT/ML ~~LOC~~ SOLN
SUBCUTANEOUS | Status: AC
  Filled 2016-05-19: qty 1

## 2016-05-19 MED ORDER — SODIUM CHLORIDE 0.9 % IV SOLN
8.0000 mg/h | INTRAVENOUS | Status: AC
  Administered 2016-05-19 – 2016-05-22 (×7): 8 mg/h via INTRAVENOUS
  Filled 2016-05-19 (×13): qty 80

## 2016-05-19 MED ORDER — ONDANSETRON HCL 4 MG/2ML IJ SOLN
4.0000 mg | Freq: Four times a day (QID) | INTRAMUSCULAR | Status: DC | PRN
  Administered 2016-05-20 – 2016-05-26 (×7): 4 mg via INTRAVENOUS
  Filled 2016-05-19 (×5): qty 2

## 2016-05-19 MED ORDER — SODIUM CHLORIDE 0.9 % IV SOLN
15.0000 ug | Freq: Once | INTRAVENOUS | Status: AC
  Administered 2016-05-19: 15.2 ug via INTRAVENOUS
  Filled 2016-05-19: qty 3.8

## 2016-05-19 MED ORDER — INSULIN ASPART 100 UNIT/ML IV SOLN
10.0000 [IU] | Freq: Once | INTRAVENOUS | Status: AC
  Administered 2016-05-19: 10 [IU] via INTRAVENOUS

## 2016-05-19 MED ORDER — SODIUM CHLORIDE 0.9 % IV SOLN
Freq: Once | INTRAVENOUS | Status: AC
  Administered 2016-05-19: via INTRAVENOUS

## 2016-05-19 MED ORDER — DEXTROSE 50 % IV SOLN
1.0000 | Freq: Once | INTRAVENOUS | Status: AC
  Administered 2016-05-19: 50 mL via INTRAVENOUS
  Filled 2016-05-19: qty 50

## 2016-05-19 MED ORDER — PANTOPRAZOLE SODIUM 40 MG IV SOLR: 40.0000 mg | Freq: Two times a day (BID) | INTRAVENOUS | Status: DC

## 2016-05-19 MED ORDER — ORAL CARE MOUTH RINSE
15.0000 mL | Freq: Two times a day (BID) | OROMUCOSAL | Status: DC
  Administered 2016-05-19 – 2016-05-27 (×10): 15 mL via OROMUCOSAL

## 2016-05-19 MED ORDER — CALCIUM GLUCONATE 10 % IV SOLN
1.0000 g | Freq: Once | INTRAVENOUS | Status: AC
  Administered 2016-05-19: 1 g via INTRAVENOUS
  Filled 2016-05-19: qty 10

## 2016-05-19 MED ORDER — ONDANSETRON 4 MG PO TBDP
4.0000 mg | ORAL_TABLET | Freq: Once | ORAL | Status: AC
  Administered 2016-05-19: 4 mg via ORAL
  Filled 2016-05-19: qty 1

## 2016-05-19 MED ORDER — SODIUM CHLORIDE 0.9 % IV BOLUS (SEPSIS): 250.0000 mL | Freq: Once | INTRAVENOUS | Status: DC

## 2016-05-19 MED ORDER — PIPERACILLIN-TAZOBACTAM 3.375 G IVPB
3.3750 g | Freq: Two times a day (BID) | INTRAVENOUS | Status: DC
  Filled 2016-05-19: qty 50

## 2016-05-19 MED ORDER — SODIUM CHLORIDE 0.9% FLUSH: 3.0000 mL | INTRAVENOUS | Status: DC | PRN

## 2016-05-19 NOTE — ED Notes (Signed)
CRITICAL VALUE ALERT  Critical value received:  K 6.4  Date of notification:  05/19/2016  Time of notification:  1633  Critical value read back:Yes.    Nurse who received alert:  [facility] rn  MD notified (1st page):  Dr. Sherry Ruffing  Time of first page: 1633  MD notified (2nd page):  Time of second page:  Responding MD:  Dr. Sherry Ruffing  Time MD responded:  [phone]

## 2016-05-19 NOTE — ED Notes (Signed)
Pt leaving with [facility] at this time, pt vital signs stable.

## 2016-05-19 NOTE — ED Provider Notes (Signed)
[facility] DEPT Provider Note   CSN: [id number] Arrival date & time: 05/19/16  1436     History   Chief Complaint Chief Complaint  Patient presents with  . Weakness    HPI Wanda [name] is a 40 y.o. female with past medical history significant for ESRD on dialysis Tuesday, Thursday, Friday, recent GI bleed with esophageal varices status post banding and Mallory-Weiss tears, cirrhosis, GERD, and depression who presents for dialysis center for severe generalized weakness, fatigue, and dark stools. Patient reports that she has been feeling "bad" for the last several days and tried to go for repeat dialysis on Saturday however, she reports that was unavailable. She says that she went to dialysis today and they sent her here for her symptoms. She reports nausea but no vomiting, and some chronic abdominal aching. She says that she is scheduled to have paracentesis this week. She denies any fevers, chills, chest pain, shortness of breath, cough, constipation, or diarrhea. She does say she had a black stools that were gone but returned the last few days. She has headache, vision changes, neck pain, or neck stiffness.  Denies any other complaints on arrival. She is accompanied by her fianc.   The history is provided by the patient, the spouse and medical records. No language interpreter was used.  Rectal Bleeding  Quality:  Black and tarry Amount:  Moderate Duration:  1 week Timing:  Constant Chronicity:  New Context: not constipation   Similar prior episodes: yes   Relieved by:  Nothing Worsened by:  Nothing Ineffective treatments:  None tried Associated symptoms: abdominal pain (mild abdominal aching) and light-headedness   Associated symptoms: no dizziness, no fever, no loss of consciousness and no vomiting   Risk factors: liver disease   Risk factors comment:  Dialysis   Past Medical History:  Diagnosis Date  . Acute blood loss anemia 02/25/2014   Status post transfusion    . Acute renal failure ([place]) 09/2013   Pre-renal- resolved  . Anasarca 10/10/2013  . Anxiety   . Bipolar disorder ([place]) 12/04/2013   2007-SEEN IN ED FOR INVOLUNTARY COMMITMENT, UDS POS FOR AMPHETAMINES/OPIATES   . Bleeding esophageal varices ([place]) 02/28/2014   s/p banding  . C. difficile colitis 04/19/2014  . Chronic hypotension   . Cirrhosis ([place]) 10/05/13   Liver bx 11/23/13 (delayed initially due to patient refusal). c/w steatohepatitis  . Cirrhosis of liver with ascites ([place])   . Depression   . ESRD (end stage renal disease) on dialysis ([place]) 08/2014  . Folate deficiency 09/2013  . Gastroesophageal junction ulcer 09/17/2014  . GERD (gastroesophageal reflux disease)   . Hematemesis/vomiting blood 02/24/2014  . Macrocytosis 02/28/2014  . PNA (pneumonia) 10/13/2013  . SBP (spontaneous bacterial peritonitis) ([place]) 11/10/2013  . Thrombocytopenia ([place])    Hypercellular bone marrow; abundant megakaryocytes per 08/27/2014; s/p bone marrow bx    Patient Active Problem List   Diagnosis Date Noted  . Mallory-Weiss tear   . Rhonchi   . Alcoholic cirrhosis of liver with ascites ([place])   . Chronic pain syndrome 08/22/2015  . ESRD (end stage renal disease) ([place]) 08/20/2015  . Abdominal wall cellulitis 08/20/2015  . Chronic abdominal pain 08/07/2015  . Umbilical hernia without obstruction and without gangrene 08/07/2015  . Liver cirrhosis secondary to NASH ([place])   . Acute blood loss anemia   . Hypotension 06/05/2015  . Edema of left lower extremity 02/22/2015  . UGIB (upper gastrointestinal bleed) 09/16/2014  . ESRD (end stage renal disease)  on dialysis [facility])   . Melena   . Macrocytosis 09/12/2014  . Thrombocytopenia ([place]) 08/19/2014  . Esophageal varices without bleeding ([place])   . Portal hypertensive gastropathy   . Malnutrition of moderate degree ([place]) 05/15/2014  . Hepatic encephalopathy ([place]) 05/14/2014  . C. difficile colitis 04/19/2014  . Bipolar disorder ([place]) 12/04/2013  . SBP  (spontaneous bacterial peritonitis) ([place]) 11/10/2013  . Liver cirrhosis secondary to nonalcoholic steatohepatitis (NASH) ([place]) 11/07/2013  . PNA (pneumonia) 10/13/2013  . Folate deficiency 10/13/2013  . Ascites 09/11/2013    Past Surgical History:  Procedure Laterality Date  . AV FISTULA PLACEMENT Right 11/16/2014   Procedure: Right arm Creation of arteriovenous fistula;  Surgeon: Angelia Mould, MD;  Location: [facility];  Service: Vascular;  Laterality: Right;  . CENTRAL VENOUS CATHETER INSERTION Right   . COLONOSCOPY N/A 12/19/2013   SLF:NO OBVIOUS SOURCE FOR ANEMIA IDETIFIED/ONE COLON POLYP REMOVED/Small internal hemorrhoids  . ESOPHAGEAL BANDING  07/04/2014   Procedure: ESOPHAGEAL BANDING;  Surgeon: Daneil Dolin, MD;  [facility];  Service: Endoscopy;;  . ESOPHAGEAL BANDING N/A 07/24/2014   Procedure: ESOPHAGEAL BANDING (2 bands applied);  Surgeon: Danie Binder, MD;  Location: [facility];  Service: Endoscopy;  Laterality: N/A;  . ESOPHAGEAL BANDING N/A 07/23/2015   Procedure: ESOPHAGEAL BANDING;  Surgeon: Danie Binder, MD;  [facility];  Service: Endoscopy;  Laterality: N/A;  . ESOPHAGEAL BANDING N/A 03/04/2016   Procedure: ESOPHAGEAL BANDING;  Surgeon: Danie Binder, MD;  [facility];  Service: Endoscopy;  Laterality: N/A;  . ESOPHAGEAL BANDING N/A 04/30/2016   Procedure: ESOPHAGEAL BANDING;  Surgeon: Daneil Dolin, MD;  [facility];  Service: Endoscopy;  Laterality: N/A;  . ESOPHAGOGASTRODUODENOSCOPY N/A 11/14/2013   SLF:1 column of very small varices in distal esopahgus/MODERATE PORTAL GASTROPATHY IN PROXIMAL STOMACH/MODERATE erosive gastritis  . ESOPHAGOGASTRODUODENOSCOPY N/A 02/11/2014   Dr. Rourk:Esophageal varices with bleeding stigmata-status post esophageal band ligation therapy. Portal gastropathy  . ESOPHAGOGASTRODUODENOSCOPY N/A 07/04/2014   RMR: Persiting grade 2 esophageal varicies with bleeding stigmata status post band  ligation. Significantly congested gastric mucosa iwith changes constistant with protal gastropathy.   . ESOPHAGOGASTRODUODENOSCOPY N/A 09/16/2014   Rehman: Single short column of varix proximal to GE junction not large enough to be banded. Two amall ulcers at the GEJ felt to be source of GI Bleeding but no active bleeding but no actibe bleeding noted. No therapy rendered. Portal gastropathy NO evidence of peptic ulcer diease or gastric varices.   . ESOPHAGOGASTRODUODENOSCOPY N/A 12/14/2014   SLF: Grade ! esophageal varices. 2. Moderate Portal Gastropathy  . ESOPHAGOGASTRODUODENOSCOPY N/A 03/27/2015   Procedure: ESOPHAGOGASTRODUODENOSCOPY (EGD);  Surgeon: Danie Binder, MD;  [facility];  Service: Endoscopy;  Laterality: N/A;  1045am - moved to 817 @ 11:30  . ESOPHAGOGASTRODUODENOSCOPY N/A 06/05/2015   Procedure: ESOPHAGOGASTRODUODENOSCOPY (EGD);  Surgeon: Clarene Essex, MD;  Location[facility] ENDOSCOPY;  Service: Endoscopy;  Laterality: N/A;  . ESOPHAGOGASTRODUODENOSCOPY N/A 07/23/2015   SLF:1. Grade 1 esophageal varices 2. Moderate portal hypertensive gastropathy 3. MILd non-erosive gastritis.   [name] ESOPHAGOGASTRODUODENOSCOPY N/A 03/04/2016   Dr. Oneida Alar: grade 1 varices, surveillance in Jan 2017   . ESOPHAGOGASTRODUODENOSCOPY (EGD) WITH PROPOFOL N/A 07/24/2014   SLF:  1. 2 columns grade 2-3 varices- 2 Bands applied.  2.  Moderate gastropathy 3. Duodenal Diverticula  . ESOPHAGOGASTRODUODENOSCOPY (EGD) WITH PROPOFOL N/A 10/26/2014   RMR: 2 columns of grade 2 esophageal varices without obvious bleeding stigmata status  post band ligation to complet obliteration of remaining varices.   . ESOPHAGOGASTRODUODENOSCOPY (EGD) WITH PROPOFOL N/A 04/30/2016   Procedure: ESOPHAGOGASTRODUODENOSCOPY (EGD) WITH PROPOFOL;  Surgeon: Daneil Dolin, MD;  [facility];  Service: Endoscopy;  Laterality: N/A;  . None    . PARACENTESIS  Feb 2015   1180 fluid, negative fluid analysis.   [name] PARACENTESIS  10/2013     OB History    Gravida Para Term Preterm AB Living   1 1 1     1    SAB TAB Ectopic Multiple Live Births                   Home Medications    Prior to Admission medications   Medication Sig Start Date End Date Taking? Authorizing Provider  clotrimazole (LOTRIMIN) 1 % cream APPLY TOPICALLY THREE TIMES DAILY FOR 10 DAYS 05/07/16   Annitta Needs, NP  Darbepoetin Alfa (ARANESP) 60 MCG/0.3ML SOSY injection Inject 0.3 mLs (60 mcg total) into the vein every Thursday with hemodialysis. 06/09/15   Cherene Altes, MD  lactulose ([place]) 10 GM/15ML solution TAKE 3 TEASPOONSFUL (15ML) BY MOUTH TWICE DAILY Patient taking differently: 7.5-15mls by mouth once daily 07/22/15   Mahala Menghini, PA-C  lidocaine (XYLOCAINE) 2 % solution TAKE TWO TEASPOONSFUL (10ML) BY MOUTH BEFORE MEALS AND AT BEDTIME TO PREVENT CHEST PAIN WHILE EATING 03/19/16   Carlis Stable, NP  lidocaine-prilocaine (EMLA) cream Apply 1 application topically as needed (for dialysis treatments).    Historical Provider, MD  LORazepam (ATIVAN) 1 MG tablet TAKE 1/2 TO 1 TABLET BY MOUTH TWICE DAILY AS NEEDED FOR BACK SPASM OR FOR ANXIETY 01/29/16   Danie Binder, MD  midodrine (PROAMATINE) 10 MG tablet Take 1 tablet (10 mg total) by mouth daily. *Takes only on dialysis days on Tuesdays, Thursdays, and Saturdays (Sometimes takes treatments on Friday instead of Saturday)**Medication to keep your blood pressure from falling. Patient taking differently: Take 10 mg by mouth as directed. *Takes only on dialysis days on Tuesdays, Thursdays, and Saturdays (Sometimes takes treatments on Friday instead of Saturday)**Medication to keep your blood pressure from falling. 06/09/15   Cherene Altes, MD  Oxycodone HCl 10 MG TABS 1 PO TID AS NEEDED FOR PAIN Patient taking differently: Take 10 mg by mouth 3 (three) times daily as needed.  02/05/16   Danie Binder, MD  pantoprazole (PROTONIX) 40 MG tablet Take 1 tablet (40 mg total) by mouth 2 (two) times  daily. Patient taking differently: Take 40 mg by mouth 2 (two) times daily as needed (acid reflux).  09/19/14   Kathie Dike, MD  promethazine (PHENERGAN) 12.5 MG tablet TAKE ONE TABLET BY MOUTH EVERY 6 HOURS AS NEEDED FOR NAUSEA AND VOMITING. 09/06/15   Mahala Menghini, PA-C  RENVELA 800 MG tablet 1 TABLET 3 TIMES A DAY WITH MEALS 10/29/15   Historical Provider, MD  rifaximin (XIFAXAN) 550 MG TABS tablet Take 1 tablet (550 mg total) by mouth 2 (two) times daily before a meal. 05/02/16   Estela Leonie Green, MD    Family History Family History  Problem Relation Age of Onset  . Heart disease Mother   . Colon cancer Neg Hx   . Liver disease Neg Hx     Social History Social History  Substance Use Topics  . Smoking status: Former Smoker    Packs/day: 0.25    Years: 20.00    Types: Cigarettes    Quit date: 11/05/2008  .  Smokeless tobacco: Never Used     Comment: Never really smoked much  . Alcohol use No     Allergies   Lasix [furosemide]; Latex; and Morphine and related   Review of Systems Review of Systems  Constitutional: Positive for chills and fatigue. Negative for diaphoresis and fever.  HENT: Negative for congestion and rhinorrhea.   Eyes: Negative for visual disturbance.  Respiratory: Negative for chest tightness, shortness of breath, wheezing and stridor.   Cardiovascular: Negative for chest pain and palpitations.  Gastrointestinal: Positive for abdominal pain (mild abdominal aching), hematochezia and nausea. Negative for constipation, diarrhea and vomiting.  Genitourinary: Negative for dysuria, flank pain and frequency.  Musculoskeletal: Negative for back pain, neck pain and neck stiffness.  Skin: Positive for wound (Bruising on left arm from previousIV placement). Negative for rash.  Neurological: Positive for light-headedness. Negative for dizziness, loss of consciousness, syncope and headaches.  Psychiatric/Behavioral: Negative for agitation.  All other systems  reviewed and are negative.    Physical Exam Updated Vital Signs BP (!) 108/34 (BP Location: Left Arm)   Pulse 105   Temp 98 F (36.7 C) (Oral)   Resp 20   Ht 5\' 1"  (1.549 m)   Wt 151 lb (68.5 kg)   LMP 09/19/2013   SpO2 100%   BMI 28.53 kg/m   Physical Exam  Constitutional: She is oriented to person, place, and time. She appears well-developed and well-nourished. No distress.  HENT:  Head: Normocephalic and atraumatic.  Mouth/Throat: Oropharynx is clear and moist. No oropharyngeal exudate.  Eyes: Conjunctivae and EOM are normal. Pupils are equal, round, and reactive to light. Scleral icterus is present.  Neck: Normal range of motion. Neck supple.  Cardiovascular: Regular rhythm, normal heart sounds and intact distal pulses.  Tachycardia present.   No murmur heard. Pulmonary/Chest: Effort normal and breath sounds normal. No stridor. No respiratory distress. She has no wheezes. She exhibits no tenderness.  Abdominal: Soft. She exhibits distension. There is generalized tenderness (mild). There is no rigidity and no CVA tenderness.  Genitourinary: Rectal exam shows guaiac positive stool.  Musculoskeletal: She exhibits no edema.  Neurological: She is alert and oriented to person, place, and time. She has normal reflexes. She is not disoriented. She displays no tremor and normal reflexes. No cranial nerve deficit or sensory deficit. She exhibits normal muscle tone. Coordination normal. GCS eye subscore is 4. GCS verbal subscore is 5. GCS motor subscore is 6.  Skin: Skin is warm and dry. Capillary refill takes less than 2 seconds. There is pallor.  Psychiatric: She has a normal mood and affect.  Nursing note and vitals reviewed.    ED Treatments / Results  Labs (all labs ordered are listed, but only abnormal results are displayed) Labs Reviewed  BASIC METABOLIC PANEL - Abnormal; Notable for the following:       Result Value   Sodium 127 (*)    Potassium 6.4 (*)    Chloride 93  (*)    CO2 11 (*)    Glucose, Bld 103 (*)    BUN 209 (*)    Creatinine, Ser 11.00 (*)    Calcium 8.1 (*)    GFR calc non Af Amer 4 (*)    GFR calc Af Amer 4 (*)    Anion gap 23 (*)    All other components within normal limits  CBC WITH DIFFERENTIAL/PLATELET - Abnormal; Notable for the following:    WBC 19.7 (*)    RBC 1.35 (*)  Hemoglobin 4.4 (*)    HCT 14.0 (*)    MCV 103.7 (*)    RDW 21.2 (*)    Neutro Abs 16.1 (*)    Monocytes Absolute 1.2 (*)    Basophils Absolute 0.2 (*)    All other components within normal limits  LIPASE, BLOOD - Abnormal; Notable for the following:    Lipase 152 (*)    All other components within normal limits  MAGNESIUM - Abnormal; Notable for the following:    Magnesium 3.1 (*)    All other components within normal limits  PROTIME-INR - Abnormal; Notable for the following:    Prothrombin Time 16.9 (*)    All other components within normal limits  HEPATIC FUNCTION PANEL - Abnormal; Notable for the following:    Total Protein 4.4 (*)    Albumin 2.0 (*)    AST 65 (*)    All other components within normal limits  CBC WITH DIFFERENTIAL/PLATELET - Abnormal; Notable for the following:    WBC 18.8 (*)    RBC 1.20 (*)    Hemoglobin 3.9 (*)    HCT 13.1 (*)    MCV 109.2 (*)    MCHC 29.8 (*)    RDW 20.3 (*)    Neutro Abs 15.6 (*)    Monocytes Absolute 1.3 (*)    All other components within normal limits  GLUCOSE, CAPILLARY - Abnormal; Notable for the following:    Glucose-Capillary 117 (*)    All other components within normal limits  CBC WITH DIFFERENTIAL/PLATELET - Abnormal; Notable for the following:    WBC 18.5 (*)    RBC 2.03 (*)    Hemoglobin 6.2 (*)    HCT 19.0 (*)    RDW 19.1 (*)    Neutro Abs 15.3 (*)    Monocytes Absolute 1.1 (*)    Basophils Absolute 0.2 (*)    All other components within normal limits  BASIC METABOLIC PANEL - Abnormal; Notable for the following:    Sodium 127 (*)    Potassium 7.2 (*)    Chloride 93 (*)    CO2  15 (*)    Glucose, Bld 125 (*)    BUN 181 (*)    Creatinine, Ser 10.65 (*)    Calcium 8.2 (*)    GFR calc non Af Amer 4 (*)    GFR calc Af Amer 5 (*)    Anion gap 19 (*)    All other components within normal limits  BASIC METABOLIC PANEL - Abnormal; Notable for the following:    Sodium 128 (*)    Potassium >7.5 (*)    Chloride 96 (*)    CO2 14 (*)    Glucose, Bld 116 (*)    BUN 195 (*)    Creatinine, Ser 10.52 (*)    Calcium 8.5 (*)    GFR calc non Af Amer 4 (*)    GFR calc Af Amer 5 (*)    Anion gap 18 (*)    All other components within normal limits  CBC - Abnormal; Notable for the following:    WBC 20.9 (*)    RBC 2.13 (*)    Hemoglobin 6.5 (*)    HCT 19.6 (*)    RDW 19.0 (*)    All other components within normal limits  CBC WITH DIFFERENTIAL/PLATELET - Abnormal; Notable for the following:    WBC 18.5 (*)    RBC 2.30 (*)    Hemoglobin 7.0 (*)    HCT 20.5 (*)  RDW 16.8 (*)    Platelets 145 (*)    All other components within normal limits  CBG MONITORING, ED - Abnormal; Notable for the following:    Glucose-Capillary 105 (*)    All other components within normal limits  POC OCCULT BLOOD, ED - Abnormal; Notable for the following:    Fecal Occult Bld POSITIVE (*)    All other components within normal limits  I-STAT CG4 LACTIC ACID, ED - Abnormal; Notable for the following:    Lactic Acid, Venous 5.39 (*)    All other components within normal limits  CBG MONITORING, ED - Abnormal; Notable for the following:    Glucose-Capillary 106 (*)    All other components within normal limits  I-STAT CG4 LACTIC ACID, ED - Abnormal; Notable for the following:    Lactic Acid, Venous 5.98 (*)    All other components within normal limits  POCT I-STAT, CHEM 8 - Abnormal; Notable for the following:    Sodium 133 (*)    Potassium 2.9 (*)    Chloride 92 (*)    BUN 88 (*)    Creatinine, Ser 4.90 (*)    Calcium, Ion 1.13 (*)    Hemoglobin 9.5 (*)    HCT 28.0 (*)    All other  components within normal limits  CULTURE, BLOOD (ROUTINE X 2)  CULTURE, BLOOD (ROUTINE X 2)  MRSA PCR SCREENING  URINE CULTURE  AMMONIA  BRAIN NATRIURETIC PEPTIDE  HCG, SERUM, QUALITATIVE  TROPONIN I  TROPONIN I  TROPONIN I  TROPONIN I  LACTIC ACID, PLASMA  URINALYSIS, ROUTINE W REFLEX MICROSCOPIC (NOT AT [facility])  LACTIC ACID, PLASMA  CBC WITH DIFFERENTIAL/PLATELET  POC OCCULT BLOOD, ED  I-STAT CHEM 8, ED  TYPE AND SCREEN  PREPARE RBC (CROSSMATCH)  TYPE AND SCREEN  PREPARE RBC (CROSSMATCH)  PREPARE RBC (CROSSMATCH)  PREPARE RBC (CROSSMATCH)    EKG  EKG Interpretation  Date/Time:  Tuesday May 19 2016 14:48:39 EDT Ventricular Rate:  107 PR Interval:  160 QRS Duration: 80 QT Interval:  332 QTC Calculation: 443 R Axis:   33 Text Interpretation:  Sinus tachycardia Nonspecific ST abnormality Abnormal ECG T waves appear peaked.  Confirmed by Sherry Ruffing MD, [place] [phone]) on 05/19/2016 3:20:05 PM       Radiology Dg Chest 2 View  Result Date: 05/19/2016 CLINICAL DATA:  Fatigue and nausea for 2-3 days EXAM: CHEST  2 VIEW COMPARISON:  04/30/2016 FINDINGS: Low volume chest with interstitial crowding and atelectasis. There is no edema, consolidation, effusion, or pneumothorax. Normal heart size and mediastinal contours for technique. IMPRESSION: Low volume chest without acute finding. Electronically Signed   By: Monte Fantasia M.D.   On: 05/19/2016 15:44    Procedures Procedures (including critical care time)  CRITICAL CARE Performed by: Gwenyth Allegra Tegeler Total critical care time:45 minutes Critical care time was exclusive of separately billable procedures and treating other patients. Critical care was necessary to treat or prevent imminent or life-threatening deterioration. Critical care was time spent personally by me on the following activities: development of treatment plan with patient and/or surrogate as well as nursing, discussions with consultants,  evaluation of patient's response to treatment, examination of patient, obtaining history from patient or surrogate, ordering and performing treatments and interventions, ordering and review of laboratory studies, ordering and review of radiographic studies, pulse oximetry and re-evaluation of patient's condition.   Medications Ordered in ED Medications  0.9 %  sodium chloride infusion (not administered)  ondansetron (ZOFRAN) injection 4 mg (  4 mg Intravenous Given 05/20/16 0640)  0.9 %  sodium chloride infusion (not administered)  sodium chloride flush (NS) 0.9 % injection 3 mL (3 mLs Intravenous Given 05/20/16 1000)  sodium chloride flush (NS) 0.9 % injection 3 mL (not administered)  0.9 %  sodium chloride infusion (not administered)  octreotide (SANDOSTATIN) 500 mcg in sodium chloride 0.9 % 250 mL (2 mcg/mL) infusion (50 mcg/hr Intravenous Rate/Dose Verify 05/20/16 0800)  pantoprazole (PROTONIX) 80 mg in sodium chloride 0.9 % 250 mL (0.32 mg/mL) infusion (8 mg/hr Intravenous Rate/Dose Verify 05/20/16 0800)  pantoprazole (PROTONIX) injection 40 mg (not administered)  MEDLINE mouth rinse (15 mLs Mouth Rinse Given 05/20/16 1026)  promethazine (PHENERGAN) injection 12.5 mg (12.5 mg Intravenous Given 05/20/16 0233)  sodium bicarbonate 100 mEq in dextrose 5 % 1,000 mL infusion ( Intravenous New Bag/Given 05/20/16 0900)  piperacillin-tazobactam (ZOSYN) IVPB 3.375 g (3.375 g Intravenous Given 05/20/16 1016)  vancomycin (VANCOCIN) IVPB 1000 mg/200 mL premix (not administered)  midodrine (PROAMATINE) tablet 10 mg (10 mg Oral Given 05/20/16 1024)  Darbepoetin Alfa (ARANESP) injection 60 mcg (60 mcg Intravenous Given 05/20/16 1145)  doxercalciferol (HECTOROL) injection 4 mcg (4 mcg Intravenous Given 05/20/16 1144)  0.9 %  sodium chloride infusion (not administered)  piperacillin-tazobactam (ZOSYN) IVPB 3.375 g (0 g Intravenous Stopped 05/19/16 1652)  vancomycin (VANCOCIN) IVPB 1000 mg/200 mL premix  (1,000 mg Intravenous New Bag/Given 05/19/16 1651)  ondansetron (ZOFRAN) injection 4 mg (4 mg Intravenous Given 05/19/16 1629)  calcium gluconate inj 10% (1 g) URGENT USE ONLY! (1 g Intravenous Given 05/19/16 1651)  albuterol (PROVENTIL) (2.5 MG/3ML) 0.083% nebulizer solution 10 mg (10 mg Nebulization Given 05/19/16 1656)  insulin aspart (novoLOG) injection 10 Units (10 Units Intravenous Given 05/19/16 1651)  dextrose 50 % solution 50 mL (50 mLs Intravenous Given 05/19/16 1650)  ondansetron (ZOFRAN-ODT) disintegrating tablet 4 mg (4 mg Oral Given 05/19/16 2016)  pantoprazole (PROTONIX) 80 mg in sodium chloride 0.9 % 100 mL IVPB (80 mg Intravenous Given 05/19/16 2051)  desmopressin (DDAVP) 15.2 mcg in sodium chloride 0.9 % 50 mL IVPB (15.2 mcg Intravenous Given 05/19/16 2105)  0.9 %  sodium chloride infusion ( Intravenous New Bag/Given 05/19/16 2358)  calcium chloride 1 g in sodium chloride 0.9 % 100 mL IVPB (1 g Intravenous Given 05/20/16 0032)  insulin aspart (novoLOG) injection 10 Units (10 Units Intravenous Given 05/19/16 2356)  dextrose 50 % solution 50 mL (50 mLs Intravenous Given 05/19/16 2346)  sodium bicarbonate injection 50 mEq (50 mEq Intravenous Given 05/19/16 2346)  cefTAZidime (FORTAZ) 2 g in dextrose 5 % 50 mL IVPB (2 g Intravenous Given 05/20/16 0132)  0.9 %  sodium chloride infusion ( Intravenous New Bag/Given 05/20/16 0600)  calcium chloride 1 g in sodium chloride 0.9 % 100 mL IVPB (1 g Intravenous Given 05/20/16 0649)  insulin aspart (novoLOG) injection 10 Units (10 Units Intravenous Given 05/20/16 0638)  dextrose 50 % solution 50 mL (50 mLs Intravenous Given 05/20/16 0639)  sodium bicarbonate 100 mEq in dextrose 5 % 1,000 mL infusion ( Intravenous New Bag/Given 05/20/16 0758)  dextrose 50 % solution (  Duplicate [id number])     Initial Impression / Assessment and Plan / ED Course  I have reviewed the triage vital signs and the nursing notes.  Pertinent labs & imaging  results that were available during my care of the patient were reviewed by me and considered in my medical decision making (see chart for details).  Clinical Course    Wanda [name]  Swartzbaugh is a 40 y.o. female with past medical history significant for ESRD on dialysis Tuesday, Thursday, Friday, recent GI bleed with esophageal varices status post banding and Mallory-Weiss tears, cirrhosis, GERD, and depression who presents for dialysis center for severe generalized weakness, fatigue, and dark stools.  History and exam are seen above.  Given patient's appearance showing pallor, positive guaiac test on stool, tachycardia, and chills, there's clinical concern for either severe anemia due to continued bleed, sepsis, and severe fluid balance/electrolyte abnormalities due to problems with dialysis. Patient quickly had IVs placed and workup was initiated.  The first letter to return was a lactic acid. Code sepsis was initiated after lactic acid returned at 5.39. 30 mL/kg of fluid was ordered. Blood cultures were obtained.   4:00 PM Laboratory testing returned showing severe anemia of 4.4. Decision made to stop normal saline infusion so as not to dilate patient's blood further than 4.4. Antibiotics will still be given for infection concern.  Nursing informed that hemoglobin had continued to drop and is now 3.9. Blood[place]ank was called and blood is less than 10 minutes away. This will be hung.   Potassium returned related at 6.4. In the setting of EKG changes from prior (possible peaked T waves), patient will be treated for hyperkalemia. Insulin, dextrose, calcium, and albuterol was ordered. After albuterol, patient had some chest pain. This was discontinued and EKG was ordered. ST segment changes were observed and troponin was sent. Patient had improvement in chest pain after albuterol was discontinued.   Patient will be transferred to [facility] for critical care management. Patient will need involvement of  gastroenterology and nephrology. Initially, treatment and Forestine Na was considered however, after nephrology was called, they report that coordinating emergent dialysis will be a challenge tonight. Thus, patient will be transferred.  Of note, the patient had repeat potassium that was improved from prior at 6.0 from 6.4. This likely was secondary to her short round of albuterol and insulin. She also received small amount of fluids with her antibiotics. Patient also however was found to have slightly increased lactic acid from prior, however, this not unexpected as she was too anemic to receive the initially ordered normal saline boluses.  2 units of blood were hung prior to transfer.   6:36 PM Patient transferred from the emergency department to [facility] for further critical care management.    Final Clinical Impressions(s) / ED Diagnoses   Final diagnoses:  Anemia, unspecified type  Generalized weakness  Hyperkalemia  Sepsis, due to unspecified organism (HCC)  Fatigue, unspecified type  Chest pain, unspecified type    Clinical Impression: 1. Anemia, unspecified type   2. Generalized weakness   3. Hyperkalemia   4. Sepsis, due to unspecified organism ([place])   5. Fatigue, unspecified type   6. Chest pain, unspecified type   7. Abdominal pain     Disposition: Admit to [facility] critical-care medical ICU    Courtney Paris, MD 05/20/16 1214

## 2016-05-19 NOTE — ED Notes (Signed)
Signature obtained for blood consent.  Consent at bedside.

## 2016-05-19 NOTE — ED Notes (Signed)
Lab at bedside

## 2016-05-19 NOTE — ED Notes (Signed)
Lab notified of troponin order.  Lab also said blood would be ready in 10 minutes.

## 2016-05-19 NOTE — ED Notes (Signed)
New EKG given to Dr. Sherry Ruffing.

## 2016-05-19 NOTE — ED Notes (Signed)
carelink and dr. tegeler notified of lactic acid 5.98

## 2016-05-19 NOTE — ED Notes (Signed)
Albuterol tx stopped per Dr. Sherry Ruffing.

## 2016-05-19 NOTE — ED Notes (Signed)
Spoke with Nicki Reaper, pharmacists to verify medication orders.

## 2016-05-19 NOTE — ED Notes (Signed)
EKG given to Dr Tegeler. 

## 2016-05-19 NOTE — Progress Notes (Signed)
Pt transferred from [facility]. She has 2 continuous gtts ordered by MD with only 2 PIVs, needing ABX and other meds too. Pt is a difficult stick. RNs on unit tried x2 unsuccessful and IV team tried multiple times with 1 successful. Lab is also having a difficult time drawing blood from left arm (graft on right upper arm). Dr. Lamonte Sakai gave a foot stick order as well as a right top hand stick order. Must have blood to check on hemoglobin as it was 3.9 at [facility]. Lab successful for labs on top of right hand. Awaiting results.

## 2016-05-19 NOTE — Progress Notes (Signed)
CRITICAL VALUE ALERT  Critical value received:  hgb 6.2 & K+ 7.2  Date of notification:  05/19/2016  Time of notification:  2316  Critical value read back:Yes.    Nurse who received alert:  Fayrene Helper  MD notified (1st page):  Dr. Jenell Milliner  Time of first page:  2320  MD notified (2nd page):  Time of second page:  Responding MD:  Dr. Jenell Milliner  Time MD responded:  2320

## 2016-05-19 NOTE — ED Notes (Signed)
Ria Comment, rn on 87m updated on pt status, that second blood transfusion has been initiated per Dr. Sherry Ruffing.

## 2016-05-19 NOTE — Progress Notes (Addendum)
[facility] Progress Note Patient Name: Wanda [name] DOB: 1976/07/22 MRN: [id number]   Date of Service  05/19/2016  HPI/Events of Note  K=7.2  eICU Interventions  Insulin and d50  bicarb , calcium to be given per protocol     Intervention Category Intermediate Interventions: Electrolyte abnormality - evaluation and management  Sophronia Varney 05/19/2016, 11:24 PM

## 2016-05-19 NOTE — ED Notes (Signed)
Pt taken to XR.  

## 2016-05-19 NOTE — Progress Notes (Addendum)
Pharmacy Antibiotic Note  Wanda [name] is a 40 y.o. female admitted on 05/19/2016 with sepsis.  Pharmacy has been consulted for vancomycin/zosyn dosing. -vancomycin 1gm given at ~ 5pm, zosyn 3.375gm given at ~ 4pm -She is noted with ESRD on HD PTA   Plan: -zosyn 3.375gm IV q12h (4 hr infusion) -No additional vancomycin now. Will follow plans for HD -Will follow cultures and clinical progress   Height: 5\' 1"  (154.9 cm) Weight: 151 lb (68.5 kg) IBW/kg (Calculated) : 47.8  Temp (24hrs), Avg:97.6 F (36.4 C), Min:97.5 F (36.4 C), Max:98 F (36.7 C)   Recent Labs Lab 05/19/16 1510 05/19/16 1529 05/19/16 1604 05/19/16 1814  WBC 19.7*  --  18.8*  --   CREATININE 11.00*  --   --   --   LATICACIDVEN  --  5.39*  --  5.98*    Estimated Creatinine Clearance: 5.9 mL/min (by C-G formula based on SCr of 11 mg/dL (H)).    Allergies  Allergen Reactions  . Lasix [Furosemide]     "doesn't work"  . Latex Itching  . Morphine And Related Hives and Itching    Antimicrobials this admission: 10/10 vanc>. 10/10 zosyn>>  Dose adjustments this admission:  Microbiology results: 10/10 MRSA PCR-  10/10 blood x2   Thank you for allowing pharmacy to be a part of this patient's care.  Hildred Laser, Pharm D 05/19/2016 7:53 PM

## 2016-05-19 NOTE — ED Notes (Signed)
CRITICAL VALUE ALERT  Critical value received:  Hemoglobin 3.9 Date of notification:  05/19/2016  Time of notification:  [id number]  Critical value read back:Yes.    Nurse who received alert:  L[facility] RN  MD notified (1st page):  Dr. Sherry Ruffing  Time of first page:  1642  MD notified (2nd page):  Time of second page:  Responding MD:  Dr. Sherry Ruffing   Time MD responded:  [phone]

## 2016-05-19 NOTE — ED Notes (Signed)
CRITICAL VALUE ALERT  Critical value received:  Hemoglobin 4.4  Date of notification:  05/19/2016  Time of notification:  [id number]  Critical value read back:Yes.    Nurse who received alert:  L[facility]   MD notified (1st page):  Dr. Sherry Ruffing Time of first page:  1551  MD notified (2nd page):  Time of second page:  Responding MD:  Dr. Sherry Ruffing  Time MD responded: [phone]

## 2016-05-19 NOTE — ED Notes (Signed)
Dr. Sherry Ruffing informed nurse to stop vancomycin and start infusing second unit of blood.

## 2016-05-19 NOTE — ED Notes (Signed)
cbg of 106.

## 2016-05-19 NOTE — ED Notes (Signed)
MD at bedside. 

## 2016-05-19 NOTE — ED Triage Notes (Signed)
Pt reports weakness and nausea that started 2 days ago. Pt states she was weak while on dialysis this am and was sent to ED.

## 2016-05-19 NOTE — ED Notes (Addendum)
  Carelink at bedside, Dr. Sherry Ruffing at bedside speaking with carelink about pt.  Pt also feels burning in wrist IV where blood is running, Dr. Sherry Ruffing updated. Not burning at this time.

## 2016-05-19 NOTE — ED Notes (Signed)
Pt placed on 2L 02 [place].   

## 2016-05-19 NOTE — ED Notes (Signed)
Ria Comment notified that both units of blood are infusing at 120ml/hr at this time.  Documentation on second unit start (1800) explained to [place]. Changed rate in computer post second unit start, however, when looking back at documentation, it appears that the "start time" indicates only a "rate dose change."  Explained that blood unit was started and verified with second nurse at 1800.  Post vitals wnl and no transfusion reaction noted with either unit of blood.

## 2016-05-19 NOTE — ED Notes (Addendum)
Second unit of blood started per Dr. Sherry Ruffing, blood verified and signed off by [name] cardwell, rn.

## 2016-05-19 NOTE — Progress Notes (Signed)
ANTIBIOTIC CONSULT NOTE-Preliminary  Pharmacy Consult for Vancomycin and Zosyn Indication: sepsis  Allergies  Allergen Reactions  . Lasix [Furosemide]     "doesn't work"  . Latex Itching  . Morphine And Related Hives and Itching   Patient Measurements: Height: 5\' 1"  (154.9 cm) Weight: 151 lb (68.5 kg) IBW/kg (Calculated) : 47.8  Vital Signs: Temp: 98 F (36.7 C) (10/10 1445) Temp Source: Oral (10/10 1445) BP: 108/34 (10/10 1445) Pulse Rate: 105 (10/10 1445)  Labs:  Recent Labs  05/19/16 1510  WBC 19.7*  HGB 4.4*  PLT 251  CREATININE 11.00*   Estimated Creatinine Clearance: 5.9 mL/min (by C-G formula based on SCr of 11 mg/dL (H)).  No results for input(s): VANCOTROUGH, VANCOPEAK, VANCORANDOM, GENTTROUGH, GENTPEAK, GENTRANDOM, TOBRATROUGH, TOBRAPEAK, TOBRARND, AMIKACINPEAK, AMIKACINTROU, AMIKACIN in the last 72 hours.   Microbiology: Recent Results (from the past 720 hour(s))  MRSA PCR Screening     Status: None   Collection Time: 04/29/16  2:30 AM  Result Value Ref Range Status   MRSA by PCR NEGATIVE NEGATIVE Final    Comment:        The GeneXpert MRSA Assay (FDA approved for NASAL specimens only), is one component of a comprehensive MRSA colonization surveillance program. It is not intended to diagnose MRSA infection nor to guide or monitor treatment for MRSA infections.    Medical History: Past Medical History:  Diagnosis Date  . Acute blood loss anemia 02/25/2014   Status post transfusion  . Acute renal failure ([place]) 09/2013   Pre-renal- resolved  . Anasarca 10/10/2013  . Anxiety   . Bipolar disorder ([place]) 12/04/2013   2007-SEEN IN ED FOR INVOLUNTARY COMMITMENT, UDS POS FOR AMPHETAMINES/OPIATES   . Bleeding esophageal varices ([place]) 02/28/2014   s/p banding  . C. difficile colitis 04/19/2014  . Chronic hypotension   . Cirrhosis ([place]) 10/05/13   Liver bx 11/23/13 (delayed initially due to patient refusal). c/w steatohepatitis  . Cirrhosis of liver with  ascites ([place])   . Depression   . ESRD (end stage renal disease) on dialysis ([place]) 08/2014  . Folate deficiency 09/2013  . Gastroesophageal junction ulcer 09/17/2014  . GERD (gastroesophageal reflux disease)   . Hematemesis/vomiting blood 02/24/2014  . Macrocytosis 02/28/2014  . PNA (pneumonia) 10/13/2013  . SBP (spontaneous bacterial peritonitis) ([place]) 11/10/2013  . Thrombocytopenia ([place])    Hypercellular bone marrow; abundant megakaryocytes per 08/27/2014; s/p bone marrow bx   Anti-infectives    Start     Dose/Rate Route Frequency Ordered Stop   05/19/16 1545  piperacillin-tazobactam (ZOSYN) IVPB 3.375 g     3.375 g 100 mL/hr over 30 Minutes Intravenous  Once 05/19/16 1540     05/19/16 1545  vancomycin (VANCOCIN) IVPB 1000 mg/200 mL premix     1,000 mg 200 mL/hr over 60 Minutes Intravenous  Once 05/19/16 1540       Assessment: 40yo female with elevated SCr and h/o ESRD requiring dialysis.  Pt initiated on Vancomycin and Zosyn for sepsis in ED.  No additional doses will be needed today.  Goal of Therapy:  Pre-Hemodialysis Vancomycin level goal range =15-25 mcg/ml  Plan:  Preliminary review of pertinent patient information completed.  Protocol will be initiated with a one-time dose(s) of Vancomycin 1000mg  and Zosyn 3.375gm.  Forestine Na clinical pharmacist will complete review during morning rounds to assess patient and finalize treatment regimen.  Hart Robinsons A, RPH 05/19/2016,4:15 PM

## 2016-05-19 NOTE — ED Notes (Signed)
Pt c/o chest pain at this time, Dr. Sherry Ruffing notified.

## 2016-05-19 NOTE — ED Notes (Signed)
Vancomycin will be given to carelink to give to primary nurse, lindsay on 5M.

## 2016-05-19 NOTE — H&P (Signed)
PULMONARY / CRITICAL CARE MEDICINE   Name: Wanda [name] MRN: [id number] DOB: 04-May-1976    ADMISSION DATE:  05/19/2016 CONSULTATION DATE:  05/19/2016  REFERRING MD:  Dr. 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 She was admitted 9/20 with melena and coffee ground emesis. She had skipped several dialysis treatments and became nauseated and had dry heaves/wretching. Anemia with hemoglobin to 6.4 requiring 2 units PRBC. EGD the following day demonstrated Mallory-Weiss tear with adherent clot, and varices that remain obliterated from prior treatments. Conservative management was recommended. Bleeding had improved labs normalized so she was discharged to home 9/23 after therapeutic paracentesis.  10/10 she presented to [facility] ED with complaints of weakness and nausea x 2 days. Also complaining of some dark stools. She was hypotensive in ED and iSTAT lactic acid 5.3 so code sepsis was initiated and NS bolus 62ml/kg ordered, but stopped because hemoglobin resulted 3.9. Antibiotics were ordered for infectious concern. 2 units PRBC also ordered. INR WNL. Due to critical illness and likely need for urgent endoscopy, Wanda [name] will be transported to [facility] for ICU admission.   PAST MEDICAL HISTORY :  She  has a past medical history of Acute blood loss anemia (02/25/2014); Acute renal failure ([place]) (09/2013); Anasarca (10/10/2013); Anxiety; Bipolar disorder ([place]) (12/04/2013); Bleeding esophageal varices (HCC) (02/28/2014); C. difficile colitis (04/19/2014); Chronic hypotension; Cirrhosis ([place]) (10/05/13); Cirrhosis of liver with ascites ([place]); Depression; ESRD (end stage renal disease) on dialysis ([place]) (08/2014); Folate deficiency (09/2013); Gastroesophageal  junction ulcer (09/17/2014); GERD (gastroesophageal reflux disease); Hematemesis/vomiting blood (02/24/2014); Macrocytosis (02/28/2014); PNA (pneumonia) (10/13/2013); SBP (spontaneous bacterial peritonitis) ([place]) (11/10/2013); and Thrombocytopenia ([place]).  PAST SURGICAL HISTORY: She  has a past surgical history that includes None; Paracentesis (Feb 2015); Esophagogastroduodenoscopy (N/A, 11/14/2013); Paracentesis (10/2013); Colonoscopy (N/A, 12/19/2013); Esophagogastroduodenoscopy (N/A, 02/11/2014); Esophagogastroduodenoscopy (N/A, 07/04/2014); esophageal banding (07/04/2014); Esophagogastroduodenoscopy (egd) with propofol (N/A, 07/24/2014); esophageal banding (N/A, 07/24/2014); Central venous catheter insertion (Right); Esophagogastroduodenoscopy (N/A, 09/16/2014); Esophagogastroduodenoscopy (egd) with propofol (N/A, 10/26/2014); AV fistula placement (Right, 11/16/2014); Esophagogastroduodenoscopy (N/A, 12/14/2014); Esophagogastroduodenoscopy (N/A, 03/27/2015); Esophagogastroduodenoscopy (N/A, 06/05/2015); Esophagogastroduodenoscopy (N/A, 07/23/2015); esophageal banding (N/A, 07/23/2015); Esophagogastroduodenoscopy (N/A, 03/04/2016); esophageal banding (N/A, 03/04/2016); Esophagogastroduodenoscopy (egd) with propofol (N/A, 04/30/2016); and esophageal banding (N/A, 04/30/2016).  Allergies  Allergen Reactions  . Lasix [Furosemide]     "doesn't work"  . Latex Itching  . Morphine And Related Hives and Itching    No current facility-administered medications on file prior to encounter.    Current Outpatient Prescriptions on File Prior to Encounter  Medication Sig  . midodrine (PROAMATINE) 10 MG tablet Take 1 tablet (10 mg total) by mouth daily. *Takes only on dialysis days on Tuesdays, Thursdays, and Saturdays (Sometimes takes treatments on Friday instead of Saturday)**Medication to keep your blood pressure from falling. (Patient taking differently: Take 10 mg by mouth as directed. *Takes only on dialysis days on Tuesdays,  Thursdays, and Saturdays (Sometimes takes treatments on Friday instead of Saturday)**Medication to keep your blood pressure from falling.)  . Oxycodone HCl 10 MG TABS 1 PO TID AS NEEDED FOR PAIN (Patient taking differently: Take 10 mg by mouth 3 (three) times daily as needed. For pain)  . clotrimazole (LOTRIMIN) 1 % cream APPLY TOPICALLY THREE TIMES DAILY FOR 10 DAYS  . Darbepoetin Alfa (ARANESP)  60 MCG/0.3ML SOSY injection Inject 0.3 mLs (60 mcg total) into the vein every Thursday with hemodialysis.  [name] lactulose (CHRONULAC) 10 GM/15ML solution TAKE 3 TEASPOONSFUL (15ML) BY MOUTH TWICE DAILY (Patient taking differently: 7.5-15mls by mouth once daily)  . lidocaine (XYLOCAINE) 2 % solution TAKE TWO TEASPOONSFUL (10ML) BY MOUTH BEFORE MEALS AND AT BEDTIME TO PREVENT CHEST PAIN WHILE EATING  . lidocaine-prilocaine (EMLA) cream Apply 1 application topically as needed (for dialysis treatments).  . LORazepam (ATIVAN) 1 MG tablet TAKE 1/2 TO 1 TABLET BY MOUTH TWICE DAILY AS NEEDED FOR BACK SPASM OR FOR ANXIETY  . pantoprazole (PROTONIX) 40 MG tablet Take 1 tablet (40 mg total) by mouth 2 (two) times daily. (Patient taking differently: Take 40 mg by mouth 2 (two) times daily as needed (acid reflux). )  . promethazine (PHENERGAN) 12.5 MG tablet TAKE ONE TABLET BY MOUTH EVERY 6 HOURS AS NEEDED FOR NAUSEA AND VOMITING.  . RENVELA 800 MG tablet 1 TABLET 3 TIMES A DAY WITH MEALS  . rifaximin (XIFAXAN) 550 MG TABS tablet Take 1 tablet (550 mg total) by mouth 2 (two) times daily before a meal.    FAMILY HISTORY:  Her indicated that her mother is deceased. She indicated that her father is deceased. She indicated that her sister is alive. She indicated that her brother is alive. She indicated that the status of her neg hx is unknown.    SOCIAL HISTORY: She  reports that she quit smoking about 7 years ago. Her smoking use included Cigarettes. She has a 5.00 pack-year smoking history. She has never used smokeless  tobacco. She reports that she does not drink alcohol or use drugs.  REVIEW OF SYSTEMS:   Bolds are positive  Constitutional: weight loss, gain, night sweats, Fevers, chills, fatigue .  HEENT: headaches, Sore throat, sneezing, nasal congestion, post nasal drip, Difficulty swallowing, Tooth/dental problems, visual complaints visual changes, ear ache CV:  chest pain(now resolved), radiates: ,Orthopnea, PND, swelling in lower extremities, dizziness, palpitations, syncope.  GI  heartburn, indigestion, abdominal pain, nausea, vomiting, diarrhea, change in bowel habits, loss of appetite, bloody stools.  Resp: cough, productive: , hemoptysis, dyspnea, chest pain, pleuritic.  Skin: rash or itching or icterus GU: dysuria, change in color of urine, urgency or frequency. flank pain, hematuria  MS: joint pain or swelling. decreased range of motion  Psych: change in mood or affect. depression or anxiety.  Neuro: difficulty with speech, weakness, numbness, ataxia    SUBJECTIVE:    VITAL SIGNS: BP (!) 98/37   Pulse 103   Temp 98 F (36.7 C) (Oral)   Resp 23   Ht 5\' 1"  (1.549 m)   Wt 68.5 kg (151 lb)   LMP 09/19/2013   SpO2 100%   BMI 28.53 kg/m   HEMODYNAMICS:    VENTILATOR SETTINGS:    INTAKE / OUTPUT: No intake/output data recorded.  PHYSICAL EXAMINATION: General:  Chronically ill appearing female in NAD  Neuro:  Alert, oriented, non-focal HEENT:  [place]/AT, PERRL, no appreciable JVD Cardiovascular:  RRR, no MRG Lungs:  clear Abdomen:  Distended, non-tender Musculoskeletal:  No acute deformity, BLE edema Skin:  jaundiced  LABS:  BMET  Recent Labs Lab 05/19/16 1510  NA 127*  K 6.4*  CL 93*  CO2 11*  BUN 209*  CREATININE 11.00*  GLUCOSE 103*    Electrolytes  Recent Labs Lab 05/19/16 1400 05/19/16 1510  CALCIUM  --  8.1*  MG 3.1*  --     CBC  Recent  Labs Lab 05/19/16 1510 05/19/16 1604  WBC 19.7* 18.8*  HGB 4.4* 3.9*  HCT 14.0* 13.1*  PLT 251 227     Coag's  Recent Labs Lab 05/19/16 1510  INR 1.36    Sepsis Markers  Recent Labs Lab 05/19/16 1529  LATICACIDVEN 5.39*    ABG No results for input(s): PHART, PCO2ART, PO2ART in the last 168 hours.  Liver Enzymes  Recent Labs Lab 05/19/16 1510  AST 65*  ALT 32  ALKPHOS 93  BILITOT 1.2  ALBUMIN 2.0*    Cardiac Enzymes No results for input(s): TROPONINI, PROBNP in the last 168 hours.  Glucose  Recent Labs Lab 05/19/16 1518 05/19/16 1646  GLUCAP 105* 106*    Imaging Dg Chest 2 View  Result Date: 05/19/2016 CLINICAL DATA:  Fatigue and nausea for 2-3 days EXAM: CHEST  2 VIEW COMPARISON:  04/30/2016 FINDINGS: Low volume chest with interstitial crowding and atelectasis. There is no edema, consolidation, effusion, or pneumothorax. Normal heart size and mediastinal contours for technique. IMPRESSION: Low volume chest without acute finding. Electronically Signed   By: Monte Fantasia M.D.   On: 05/19/2016 15:44     STUDIES:  EGD 9/21 > Mallory-Weiss tear without active bleeding currently. Esophageal mucosa scars present ?varices pretty well obliterated. Hiatal hernia. Congested portal gastropathy  CULTURES: BCx2 10/10 > Urine 10/10 >  ANTIBIOTICS: Zosyn 10/10  In ED Vancomycin 10/10 in ED  Ceftaz 10/10 >>  SIGNIFICANT EVENTS: 9/20-9/23 admit to [facility] for mallory weiss tear, clotted on EGD and managed conservatively.  10/10 admit with severe ABLA  LINES/TUBES:   DISCUSSION: 40 year old female with NASH cirrhosis and ESRD. History GIB with recent mallory weiss tear. Now presenting with hemorrhagic shock likely due to recurrent GI bleeding. Plan to transfuse and monitor on PPI infusion and sandostatin. GI to see in AM  ASSESSMENT / PLAN:  PULMONARY A: No acute issues  P:   Supplemental O2 in setting profound anemia.   CARDIOVASCULAR A:  Hemorrhagic shock > improved with volume and minimal blood products.  P:  MAP goal > 57mmHg Blood products  for MAP goal Consider norepinephrine if not meeting goals See GI  RENAL A:   ESRD on HD (TTF) Hyperkalemia Hyponatremia Hypochloremia High AG acidosis - lactic  P:   Close monitoring with blood transfusion.  Repeat CMP post transfusion and BMP in AM Hyperkalemia treated in ED with insulin, calcium Consult nephrology in AM  GASTROINTESTINAL A:   Gastrointestinal bleeding with recent history of Mallory-Weiss tear NASH cirrhosis Protein calorie malnutrition  P:   NPO Protonix bolus and infusion Octreotide infusion GI to see in AM suspect will need repeat EGD  HEMATOLOGIC A:   Acute blood loss anemia Anemia of chronic illness (Hgb 10 at time of recent discharge)  P:  2 units transfusing now, then will need to repeat type and screen prior to more products being given. Repeat H&H at that time.  Follow CBC Transfuse for goal hemoglobin > 7 64mcg ddavp  INFECTIOUS A:   SIRS, no clear infectious source, must consider SBP  P:   Ceftaz Cultures PCT  ENDOCRINE A:   No acute issues    P:   Monitor glucose on chemistries  NEUROLOGIC A:   Acute metabolic encephalopathy, likely hepatic.   P:   RASS goal: 0 Monitor closely Ammonia level   FAMILY  - Updates: patient updated bedside 10/10  - Inter-disciplinary family meet or Palliative Care meeting due by:  10/17   Eddie Dibbles  Clyde Lundborg [facility] Pulmonology/Critical Care Pager [phone] or [phone]  05/19/2016 5:47 PM

## 2016-05-19 NOTE — ED Notes (Signed)
Pt placed on 2L 02 [place].   

## 2016-05-19 NOTE — Progress Notes (Signed)
[facility] [place] Progress Note Patient Name: Wanda [name] DOB: [birth year] MRN: [id number]   Date of Service  05/19/2016  HPI/Events of Note  hgb 6.2  eICU Interventions  Will give one unit PRBC     Intervention Category Evaluation Type: Other  [name] 05/19/2016, 11:19 PM

## 2016-05-19 NOTE — ED Notes (Signed)
Pt now having burning in left wrist, Dr. Sherry Ruffing aware, continuing at 131ml/hr per dr. Sherry Ruffing.

## 2016-05-19 NOTE — ED Notes (Signed)
EMTALA, carelink/transfer, medical necessity, facesheet, ekgs (3), blood transfusion audit sheet, blood transfusion consent given to carelink for transfer.

## 2016-05-20 ENCOUNTER — Inpatient Hospital Stay (HOSPITAL_COMMUNITY): Payer: Medicare Other

## 2016-05-20 ENCOUNTER — Other Ambulatory Visit (HOSPITAL_COMMUNITY): Payer: Self-pay

## 2016-05-20 ENCOUNTER — Ambulatory Visit (HOSPITAL_COMMUNITY): Admission: RE | Admit: 2016-05-20 | Payer: Medicare Other | Source: Ambulatory Visit

## 2016-05-20 DIAGNOSIS — L8990 Pressure ulcer of unspecified site, unspecified stage: Secondary | ICD-10-CM | POA: Insufficient documentation

## 2016-05-20 DIAGNOSIS — K7581 Nonalcoholic steatohepatitis (NASH): Secondary | ICD-10-CM

## 2016-05-20 DIAGNOSIS — K7460 Unspecified cirrhosis of liver: Secondary | ICD-10-CM

## 2016-05-20 DIAGNOSIS — R579 Shock, unspecified: Secondary | ICD-10-CM

## 2016-05-20 DIAGNOSIS — K921 Melena: Secondary | ICD-10-CM

## 2016-05-20 DIAGNOSIS — E872 Acidosis: Secondary | ICD-10-CM

## 2016-05-20 DIAGNOSIS — N186 End stage renal disease: Secondary | ICD-10-CM

## 2016-05-20 DIAGNOSIS — R188 Other ascites: Secondary | ICD-10-CM

## 2016-05-20 DIAGNOSIS — E875 Hyperkalemia: Secondary | ICD-10-CM

## 2016-05-20 DIAGNOSIS — Z992 Dependence on renal dialysis: Secondary | ICD-10-CM

## 2016-05-20 DIAGNOSIS — K922 Gastrointestinal hemorrhage, unspecified: Secondary | ICD-10-CM

## 2016-05-20 LAB — CBC WITH DIFFERENTIAL/PLATELET
BASOS ABS: 0 10*3/uL (ref 0.0–0.1)
BASOS PCT: 1 %
Basophils Absolute: 0.2 10*3/uL — ABNORMAL HIGH (ref 0.0–0.1)
Basophils Relative: 0 %
EOS ABS: 0.2 10*3/uL (ref 0.0–0.7)
EOS PCT: 1 %
Eosinophils Absolute: 0.2 10*3/uL (ref 0.0–0.7)
Eosinophils Relative: 1 %
HCT: 24.4 % — ABNORMAL LOW (ref 36.0–46.0)
HEMATOCRIT: 20.5 % — AB (ref 36.0–46.0)
Hemoglobin: 7 g/dL — ABNORMAL LOW (ref 12.0–15.0)
Hemoglobin: 8.5 g/dL — ABNORMAL LOW (ref 12.0–15.0)
LYMPHS ABS: 2.2 10*3/uL (ref 0.7–4.0)
LYMPHS ABS: 1.9 10*3/uL (ref 0.7–4.0)
LYMPHS PCT: 9 %
Lymphocytes Relative: 12 %
MCH: 30.4 pg (ref 26.0–34.0)
MCH: 31 pg (ref 26.0–34.0)
MCHC: 34.1 g/dL (ref 30.0–36.0)
MCHC: 34.8 g/dL (ref 30.0–36.0)
MCV: 89.1 fL (ref 78.0–100.0)
MCV: 89.1 fL (ref 78.0–100.0)
MONO ABS: 1.5 10*3/uL — AB (ref 0.1–1.0)
Monocytes Absolute: 2.1 10*3/uL — ABNORMAL HIGH (ref 0.1–1.0)
Monocytes Relative: 8 %
Monocytes Relative: 10 %
NEUTROS ABS: 14.4 10*3/uL — AB (ref 1.7–7.7)
Neutro Abs: 17.2 10*3/uL — ABNORMAL HIGH (ref 1.7–7.7)
Neutrophils Relative %: 78 %
Neutrophils Relative %: 80 %
PLATELETS: 105 10*3/uL — AB (ref 150–400)
Platelets: 145 10*3/uL — ABNORMAL LOW (ref 150–400)
RBC: 2.3 MIL/uL — ABNORMAL LOW (ref 3.87–5.11)
RBC: 2.74 MIL/uL — AB (ref 3.87–5.11)
RDW: 16.8 % — AB (ref 11.5–15.5)
RDW: 16.6 % — AB (ref 11.5–15.5)
WBC: 18.5 10*3/uL — ABNORMAL HIGH (ref 4.0–10.5)
WBC: 21.4 10*3/uL — ABNORMAL HIGH (ref 4.0–10.5)

## 2016-05-20 LAB — RENAL FUNCTION PANEL
ALBUMIN: 1.7 g/dL — AB (ref 3.5–5.0)
ANION GAP: 14 (ref 5–15)
BUN: 107 mg/dL — AB (ref 6–20)
CALCIUM: 8.1 mg/dL — AB (ref 8.9–10.3)
CO2: 20 mmol/L — AB (ref 22–32)
Chloride: 99 mmol/L — ABNORMAL LOW (ref 101–111)
Creatinine, Ser: 6.77 mg/dL — ABNORMAL HIGH (ref 0.44–1.00)
GFR calc Af Amer: 8 mL/min — ABNORMAL LOW (ref >60)
GFR calc non Af Amer: 7 mL/min — ABNORMAL LOW (ref >60)
GLUCOSE: 130 mg/dL — AB (ref 65–99)
Phosphorus: 7.2 mg/dL — ABNORMAL HIGH (ref 2.5–4.6)
Potassium: 4.8 mmol/L (ref 3.5–5.1)
SODIUM: 133 mmol/L — AB (ref 135–145)

## 2016-05-20 LAB — BASIC METABOLIC PANEL
Anion gap: 18 — ABNORMAL HIGH (ref 5–15)
BUN: 195 mg/dL — AB (ref 6–20)
CALCIUM: 8.5 mg/dL — AB (ref 8.9–10.3)
CO2: 14 mmol/L — ABNORMAL LOW (ref 22–32)
CREATININE: 10.52 mg/dL — AB (ref 0.44–1.00)
Chloride: 96 mmol/L — ABNORMAL LOW (ref 101–111)
GFR calc Af Amer: 5 mL/min — ABNORMAL LOW (ref >60)
GFR, EST NON AFRICAN AMERICAN: 4 mL/min — AB (ref >60)
Glucose, Bld: 116 mg/dL — ABNORMAL HIGH (ref 65–99)
Potassium: >7.5 mmol/L (ref 3.5–5.1)
SODIUM: 128 mmol/L — AB (ref 135–145)

## 2016-05-20 LAB — TYPE AND SCREEN
ABO/RH(D): A POS
Antibody Screen: NEGATIVE
UNIT DIVISION: 0
UNIT DIVISION: 0

## 2016-05-20 LAB — POCT I-STAT, CHEM 8
BUN: 88 mg/dL — ABNORMAL HIGH (ref 6–20)
CALCIUM ION: 1.13 mmol/L — AB (ref 1.15–1.40)
CHLORIDE: 92 mmol/L — AB (ref 101–111)
Creatinine, Ser: 4.9 mg/dL — ABNORMAL HIGH (ref 0.44–1.00)
GLUCOSE: 99 mg/dL (ref 65–99)
HCT: 28 % — ABNORMAL LOW (ref 36.0–46.0)
HEMOGLOBIN: 9.5 g/dL — AB (ref 12.0–15.0)
Potassium: 2.9 mmol/L — ABNORMAL LOW (ref 3.5–5.1)
SODIUM: 133 mmol/L — AB (ref 135–145)
TCO2: 24 mmol/L (ref 0–100)

## 2016-05-20 LAB — CBC
HEMATOCRIT: 19.6 % — AB (ref 36.0–46.0)
Hemoglobin: 6.5 g/dL — CL (ref 12.0–15.0)
MCH: 30.5 pg (ref 26.0–34.0)
MCHC: 33.2 g/dL (ref 30.0–36.0)
MCV: 92 fL (ref 78.0–100.0)
PLATELETS: 200 10*3/uL (ref 150–400)
RBC: 2.13 MIL/uL — ABNORMAL LOW (ref 3.87–5.11)
RDW: 19 % — AB (ref 11.5–15.5)
WBC: 20.9 10*3/uL — AB (ref 4.0–10.5)

## 2016-05-20 LAB — LACTIC ACID, PLASMA
Lactic Acid, Venous: 1.2 mmol/L (ref 0.5–1.9)
Lactic Acid, Venous: 2.8 mmol/L (ref 0.5–1.9)

## 2016-05-20 LAB — TROPONIN I
Troponin I: <0.03 ng/mL (ref <0.03)
Troponin I: <0.03 ng/mL (ref <0.03)

## 2016-05-20 LAB — PREPARE RBC (CROSSMATCH)

## 2016-05-20 LAB — MAGNESIUM: Magnesium: 2.2 mg/dL (ref 1.7–2.4)

## 2016-05-20 MED ORDER — VANCOMYCIN HCL IN DEXTROSE 1-5 GM/200ML-% IV SOLN
1000.0000 mg | Freq: Once | INTRAVENOUS | Status: AC
  Administered 2016-05-20: 1000 mg via INTRAVENOUS
  Filled 2016-05-20: qty 200

## 2016-05-20 MED ORDER — FENTANYL CITRATE (PF) 100 MCG/2ML IJ SOLN
12.5000 ug | INTRAMUSCULAR | Status: DC | PRN
  Administered 2016-05-20: 12.5 ug via INTRAVENOUS
  Administered 2016-05-20 – 2016-05-21 (×2): 25 ug via INTRAVENOUS
  Administered 2016-05-21: 12.5 ug via INTRAVENOUS
  Administered 2016-05-21: 25 ug via INTRAVENOUS
  Filled 2016-05-20 (×5): qty 2

## 2016-05-20 MED ORDER — LIDOCAINE VISCOUS 2 % MT SOLN
10.0000 mL | Freq: Once | OROMUCOSAL | Status: AC
Start: 2016-05-20 — End: 2016-05-20
  Administered 2016-05-20: 10 mL via OROMUCOSAL
  Filled 2016-05-20: qty 15

## 2016-05-20 MED ORDER — DEXTROSE 5 % IV SOLN
2.0000 g | Freq: Once | INTRAVENOUS | Status: AC
  Administered 2016-05-20: 2 g via INTRAVENOUS
  Filled 2016-05-20: qty 2

## 2016-05-20 MED ORDER — DEXTROSE 50 % IV SOLN
1.0000 | Freq: Once | INTRAVENOUS | Status: AC
  Administered 2016-05-20: 50 mL via INTRAVENOUS
  Filled 2016-05-20: qty 50

## 2016-05-20 MED ORDER — MIDODRINE HCL 5 MG PO TABS
10.0000 mg | ORAL_TABLET | Freq: Three times a day (TID) | ORAL | Status: DC
  Administered 2016-05-20 – 2016-05-27 (×20): 10 mg via ORAL
  Filled 2016-05-20 (×23): qty 2

## 2016-05-20 MED ORDER — SODIUM CHLORIDE 0.9 % IV SOLN
Freq: Once | INTRAVENOUS | Status: AC
  Administered 2016-05-20: 06:00:00 via INTRAVENOUS

## 2016-05-20 MED ORDER — DEXTROSE 5 % IV SOLN
Freq: Once | INTRAVENOUS | Status: AC
  Administered 2016-05-20: 08:00:00 via INTRAVENOUS
  Filled 2016-05-20: qty 100

## 2016-05-20 MED ORDER — PHENYLEPHRINE HCL 10 MG/ML IJ SOLN
30.0000 ug/min | INTRAMUSCULAR | Status: DC
  Filled 2016-05-20: qty 1

## 2016-05-20 MED ORDER — SODIUM CHLORIDE 0.9 % IV SOLN: Freq: Once | INTRAVENOUS | Status: AC

## 2016-05-20 MED ORDER — INSULIN ASPART 100 UNIT/ML IV SOLN
10.0000 [IU] | Freq: Once | INTRAVENOUS | Status: AC
  Administered 2016-05-20: 10 [IU] via INTRAVENOUS

## 2016-05-20 MED ORDER — PROMETHAZINE HCL 25 MG/ML IJ SOLN
12.5000 mg | Freq: Four times a day (QID) | INTRAMUSCULAR | Status: DC | PRN
  Administered 2016-05-20: 12.5 mg via INTRAVENOUS
  Filled 2016-05-20: qty 1

## 2016-05-20 MED ORDER — DARBEPOETIN ALFA 60 MCG/0.3ML IJ SOSY
PREFILLED_SYRINGE | INTRAMUSCULAR | Status: AC
  Administered 2016-05-20: 60 ug via INTRAVENOUS
  Filled 2016-05-20: qty 0.3

## 2016-05-20 MED ORDER — SODIUM CHLORIDE 0.9 % IV BOLUS (SEPSIS)
250.0000 mL | Freq: Once | INTRAVENOUS | Status: AC
  Administered 2016-05-20: 250 mL via INTRAVENOUS

## 2016-05-20 MED ORDER — DARBEPOETIN ALFA 60 MCG/0.3ML IJ SOSY
60.0000 ug | PREFILLED_SYRINGE | INTRAMUSCULAR | Status: DC
  Administered 2016-05-20 – 2016-05-21 (×2): 60 ug via INTRAVENOUS
  Filled 2016-05-20 (×2): qty 0.3

## 2016-05-20 MED ORDER — SODIUM CHLORIDE 0.9 % IV SOLN
1.0000 g | Freq: Once | INTRAVENOUS | Status: AC
  Administered 2016-05-20: 1 g via INTRAVENOUS
  Filled 2016-05-20: qty 10

## 2016-05-20 MED ORDER — DOXERCALCIFEROL 4 MCG/2ML IV SOLN
4.0000 ug | INTRAVENOUS | Status: DC
  Administered 2016-05-20 – 2016-05-26 (×4): 4 ug via INTRAVENOUS
  Filled 2016-05-20 (×4): qty 2

## 2016-05-20 MED ORDER — DEXTROSE 50 % IV SOLN
INTRAVENOUS | Status: AC
  Filled 2016-05-20: qty 50

## 2016-05-20 MED ORDER — SODIUM BICARBONATE 8.4 % IV SOLN
Freq: Once | INTRAVENOUS | Status: AC
  Administered 2016-05-20: 09:00:00 via INTRAVENOUS
  Filled 2016-05-20: qty 100

## 2016-05-20 MED ORDER — PIPERACILLIN-TAZOBACTAM 3.375 G IVPB
3.3750 g | Freq: Two times a day (BID) | INTRAVENOUS | Status: DC
  Administered 2016-05-20 – 2016-05-22 (×6): 3.375 g via INTRAVENOUS
  Filled 2016-05-20 (×8): qty 50

## 2016-05-20 MED ORDER — DOXERCALCIFEROL 4 MCG/2ML IV SOLN
INTRAVENOUS | Status: AC
  Administered 2016-05-20: 4 ug via INTRAVENOUS
  Filled 2016-05-20: qty 2

## 2016-05-20 NOTE — Progress Notes (Signed)
[facility] Progress Note Patient Name: Wanda [name] DOB: 1975-12-17 MRN: [id number]   Date of Service  05/20/2016  HPI/Events of Note  Hgb=6.5  eICU Interventions  Transfuse one unit prbc     Intervention Category Evaluation Type: Other  Flora Lipps 05/20/2016, 5:39 AM

## 2016-05-20 NOTE — Progress Notes (Signed)
Arrived to patient room 23M-03 at 1040.  Reviewed treatment plan and this RN agrees.  Report received from bedside RN, Anderson Malta.  Consent obtained.  Patient A & O X 4. Lung sounds diminished to ausculation in all fields. BLE +2 pitting edema. Cardiac: NSR to ST.  Prepped RUAVF with alcohol and cannulated with two 16 gauge needles.  Pulsation of blood noted.  Flushed access well with saline per protocol.  Connected and secured lines and initiated tx at 1055.  UF goal of 1500 mL and net fluid removal of 500 mL.  Will continue to monitor.

## 2016-05-20 NOTE — Progress Notes (Signed)
Pt noted vomiting up small amount of brown/dark red contents in green bag. Dr. Mortimer Fries made aware and new orders carried out.

## 2016-05-20 NOTE — Progress Notes (Signed)
Pharmacy Antibiotic Note  Wanda [name] is a 40 y.o. female admitted on 05/19/2016 with sepsis.  Pharmacy has been consulted for Vancomycin and Zosyn dosing. Patient was initially seen at [facility] ED and given Vancomycin 1g and Zosyn 3.375g x1 then transitioned to [place]. Due to concern for other sources, broadening for HCAP organisms. Narrow as able.   Plan: Vancomycin 1g post HD today - higher dose due to low loading dose given 10/10.  Zosyn 3.375g IV every 12 hours- 4hr infusion.  Monitor HD schedule and need to adjust dosing.  Consider pre-HD vancomycin levels if continued   Height: 5' (152.4 cm) Weight: 156 lb 1.4 oz (70.8 kg) IBW/kg (Calculated) : 45.5  Temp (24hrs), Avg:97.5 F (36.4 C), Min:97.3 F (36.3 C), Max:98 F (36.7 C)   Recent Labs Lab 05/19/16 1510 05/19/16 1529 05/19/16 1604 05/19/16 1814 05/19/16 2130 05/20/16 0438  WBC 19.7*  --  18.8*  --  18.5* 20.9*  CREATININE 11.00*  --   --   --  10.65* 10.52*  LATICACIDVEN  --  5.39*  --  5.98*  --   --     Estimated Creatinine Clearance: 6.1 mL/min (by C-G formula based on SCr of 10.52 mg/dL (H)).    Allergies  Allergen Reactions  . Lasix [Furosemide]     "doesn't work"  . Latex Itching  . Morphine And Related Hives and Itching    Antimicrobials this admission: 10/11 Ceftaz>>10/11 10/10 Vanc x1, restart 10/11 >> 10/10 Zosyn x1, restart 10/11 >>  Dose adjustments this admission:   Microbiology results:  10/10 BCx: pending 10/10 UCx: sent  10/10 MRSA PCR: negative  Thank you for allowing pharmacy to be a part of this patient's care.  Sloan Leiter, PharmD, BCPS Clinical Pharmacist [phone] 05/20/2016 9:13 AM

## 2016-05-20 NOTE — Anesthesia Preprocedure Evaluation (Addendum)
Anesthesia Evaluation  Patient identified by MRN, date of birth, ID band Patient awake    Reviewed: Allergy & Precautions, NPO status , Patient's Chart, lab work & pertinent test results  Airway Mallampati: II  TM Distance: >3 FB Neck ROM: Full    Dental  (+) Teeth Intact, Dental Advisory Given   Pulmonary former smoker,    Pulmonary exam normal breath sounds clear to auscultation       Cardiovascular negative cardio ROS Normal cardiovascular exam Rhythm:Regular Rate:Normal     Neuro/Psych PSYCHIATRIC DISORDERS Anxiety Depression Bipolar Disorder negative neurological ROS     GI/Hepatic PUD, GERD  Medicated,(+) Cirrhosis   Esophageal Varices and ascites    ,   Endo/Other  Obesity   Renal/GU ESRF and DialysisRenal disease     Musculoskeletal negative musculoskeletal ROS (+)   Abdominal   Peds  Hematology  (+) Blood dyscrasia (Thrombocytopenia), anemia ,   Anesthesia Other Findings Day of surgery medications reviewed with the patient.  Reproductive/Obstetrics                            Anesthesia Physical Anesthesia Plan  ASA: IV  Anesthesia Plan: MAC   Post-op Pain Management:    Induction: Intravenous  Airway Management Planned: Nasal Cannula  Additional Equipment:   Intra-op Plan:   Post-operative Plan:   Informed Consent: I have reviewed the patients History and Physical, chart, labs and discussed the procedure including the risks, benefits and alternatives for the proposed anesthesia with the patient or authorized representative who has indicated his/her understanding and acceptance.   Dental advisory given  Plan Discussed with: CRNA  Anesthesia Plan Comments: (Discussed risks/benefits/alternatives to MAC sedation including need for ventilatory support, hypotension, need for conversion to general anesthesia.  All patient questions answered.  Patient/guardian wishes to  proceed.)       Anesthesia Quick Evaluation

## 2016-05-20 NOTE — Procedures (Signed)
Central Venous Catheter Insertion Procedure Note Wanda [name] [id number] 1976/04/20  Procedure: Insertion of Central Venous Catheter Indications: Assessment of intravascular volume, Drug and/or fluid administration and Frequent blood sampling  Procedure Details Consent: Risks of procedure as well as the alternatives and risks of each were explained to the (patient/caregiver).  Consent for procedure obtained. Time Out: Verified patient identification, verified procedure, site/side was marked, verified correct patient position, special equipment/implants available, medications/allergies/relevent history reviewed, required imaging and test results available.  Performed  Maximum sterile technique was used including antiseptics, cap, gloves, gown, hand hygiene, mask and sheet. Skin prep: Chlorhexidine; local anesthetic administered A antimicrobial bonded/coated triple lumen catheter was placed in the left internal jugular vein using the Seldinger technique. Ultrasound guidance used.Yes.   Catheter placed to 20 cm. Blood aspirated via all 3 ports and then flushed x 3. Line sutured x 2 and dressing applied.  Evaluation Blood flow good Complications: No apparent complications Patient did tolerate procedure well. Chest X-ray ordered to verify placement.  CXR: pending.  Georgann Housekeeper, AGACNP-BC [place] [facility]/Critical Care Pager [phone] or [phone]  05/20/2016 5:15 PM  Rush Farmer, M.D. [facility] Pulmonary/Critical Care Medicine. Pager: [phone]. After hours pager: [phone].

## 2016-05-20 NOTE — Progress Notes (Signed)
iStat potassium result: 2.9.  Nephrology notified and HD treatment modified.  Will continue to monitor.

## 2016-05-20 NOTE — Progress Notes (Signed)
Dialysis treatment terminated per patient with 1 hour and 22 minutes remaining.  Nephrology notified and AMA form signed.  1000 mL ultrafiltrated and net fluid removal -300 mL.    Patient status unchanged. Lung sounds diminished to ausculation in all fields. BLE +2 pitting edema. Cardiac: Sinus Tach.  Disconnected lines and removed needles.  Pressure held for 10 minutes and band aid/gauze dressing applied.  Report given to bedside RN, Anderson Malta.

## 2016-05-20 NOTE — Consult Note (Signed)
[facility] Consult: 8:23 AM 05/20/2016  LOS: 1 day    Referring Provider: Dr Titus Mould.   Primary Care Physician:  [name], MD Primary Gastroenterologist:  Dr. Oneida Alar and Sydell Axon.     Reason for Consultation:  Anemia and tarry stools    [facility] Attending   I have taken an interval history, reviewed the chart and examined the patient. I agree with the Advanced Practitioner's note, impression and recommendations. Additions in bold  Wanda Mayer, MD, [facility] [phone] (pager) [phone] after 5 PM, weekends and holidays  05/20/2016 10:30 AM      IMPRESSION:   *  Recurrent blood loss anemia and GI bleed.  Pt with hx esoph variceal bleeding.   EGD 20 days ago for hematemesis showed eradicated esoph varices, portal gastropathy and MW tear.   On PPI drip, octreotide drip. S/p multiple PRBCs  *  ESRD.   *  Elevated Lipase.   *  Hx SBP.  On Zosyn, Vanco for code sepsis.    *  Medical, HD non-compliance.   NASH cirrhosis - hepatic encephalopathy  Ascites - SBP risk  PLAN:     *  EGD.  This set for 8 AM tomorrow.  MAC available then, but not today.  Can add her on for today if becomes unstable.   Will need a paracentesis at some point this admit  Mental status reasonably clear - could need adjustment/Tx   Needs hemodialysis more than anything now - agree with blood at HD  Allow clears today  Continue Abx to reduce infection risk - cirrhosis/hx varices and ascites  Mickie Ziebell  05/20/2016, 8:23 AM Pager: [phone]     HPI: Wanda [name] is a 40 y.o. female.  pmh of ESRD.  Chronic hypotension. SBP.  Bipolar disorder.  Thrombocytopenis  NASH cirrhosis.  Ascites.  variceal bleeding s/p esophageal variceal bleeding, s/p band ligation.   Multiple endoscopies dating back to 12/2013 for surveillance and/or acute hematemesis, anemia. Last of these was 04/30/16 after skipping several HD sessions.  Some of these included here, but this is not the full list of past EGDs.    04/30/16 EGD: MW tear, not actively bleeding.  Esophageal scars in regions of previous band ligation.  HH.  Congested portal gastropathy.   03/04/16 EGD: for variceal follow up, previous EGD 07/2015.  Dr Oneida Alar.  Grade 1 esoph varices.  Portal hypertensive gastropathy.  Normal duodenum.  No gastric or duodenal varices.  03/2015 EGD for variceal surveillance:  GRADE 1 ESOPHAGEAL VARICES.  MODERATE PORTAL HYPERTENSIVE GASTROPATHY. 09/2014 EGD: for hematemesis and 4 unit bleed.   06/2014 EGD for hematemesis.  Previous hx esoph variceal bleeding and band ligation.  persistent grade 2 esoph varices with bleeding stigmata.  Band ligation x 7.    12/2013 EGD for variceal screening and "transfusion dependent anemia" in a cirrhotic.  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  MODERATE PORTAL GASTROPATHY IN PROXIMAL STOMACH.  MODERATE erosive gastritis 12/2013 Colonoscopy: single polyp removed, small internal hemorrhoids.   Presented to  [facility] with weakness, dark stools, nausea for 2 days.  Pt is poor historian.  Tells me she started with dark stools ~ Thursday or Friday.  No vomiting.  Taking Naproxen for multiple pain issues.  Last HD was 10/5.  Tachy to low 100s, SBPs 90s to low 100s.   Elevated lactate and hypotensive.  Hgb 3/9.  S/p Prbcs x 4.  Latest hgb 6.5, at which time 4th PRBC ordered. Platelets 200K.  Coags ok.  Troponins ok.    PTA med list includes  Aranesp, lactulose, Protonix 40 mg BID, Xifaxan, oxycodone. No diuretics.  She is not taking the Protonix, not sure about Xifaxan, and only takes lactulose every few days.     Past Medical History:  Diagnosis Date  . Acute blood loss anemia 02/25/2014   Status post transfusion  . Acute renal failure ([place]) 09/2013    Pre-renal- resolved  . Anasarca 10/10/2013  . Anxiety   . Bipolar disorder ([place]) 12/04/2013   2007-SEEN IN ED FOR INVOLUNTARY COMMITMENT, UDS POS FOR AMPHETAMINES/OPIATES   . Bleeding esophageal varices ([place]) 02/28/2014   s/p banding  . C. difficile colitis 04/19/2014  . Chronic hypotension   . Cirrhosis ([place]) 10/05/13   Liver bx 11/23/13 (delayed initially due to patient refusal). c/w steatohepatitis  . Cirrhosis of liver with ascites ([place])   . Depression   . ESRD (end stage renal disease) on dialysis ([place]) 08/2014  . Folate deficiency 09/2013  . Gastroesophageal junction ulcer 09/17/2014  . GERD (gastroesophageal reflux disease)   . Hematemesis/vomiting blood 02/24/2014  . Macrocytosis 02/28/2014  . PNA (pneumonia) 10/13/2013  . SBP (spontaneous bacterial peritonitis) ([place]) 11/10/2013  . Thrombocytopenia ([place])    Hypercellular bone marrow; abundant megakaryocytes per 08/27/2014; s/p bone marrow bx    Past Surgical History:  Procedure Laterality Date  . AV FISTULA PLACEMENT Right 11/16/2014   Procedure: Right arm Creation of arteriovenous fistula;  Surgeon: Angelia Mould, MD;  Location: [facility];  Service: Vascular;  Laterality: Right;  . CENTRAL VENOUS CATHETER INSERTION Right   . COLONOSCOPY N/A 12/19/2013   SLF:NO OBVIOUS SOURCE FOR ANEMIA IDETIFIED/ONE COLON POLYP REMOVED/Small internal hemorrhoids  . ESOPHAGEAL BANDING  07/04/2014   Procedure: ESOPHAGEAL BANDING;  Surgeon: Daneil Dolin, MD;  [facility];  Service: Endoscopy;;  . ESOPHAGEAL BANDING N/A 07/24/2014   Procedure: ESOPHAGEAL BANDING (2 bands applied);  Surgeon: Danie Binder, MD;  Location: [facility];  Service: Endoscopy;  Laterality: N/A;  . ESOPHAGEAL BANDING N/A 07/23/2015   Procedure: ESOPHAGEAL BANDING;  Surgeon: Danie Binder, MD;  [facility];  Service: Endoscopy;  Laterality: N/A;  . ESOPHAGEAL BANDING N/A 03/04/2016   Procedure: ESOPHAGEAL BANDING;  Surgeon: Danie Binder, MD;  [facility]  END[facility];  Service: Endoscopy;  Laterality: N/A;  . ESOPHAGEAL BANDING N/A 04/30/2016   Procedure: ESOPHAGEAL BANDING;  Surgeon: Daneil Dolin, MD;  [facility];  Service: Endoscopy;  Laterality: N/A;  . ESOPHAGOGASTRODUODENOSCOPY N/A 11/14/2013   SLF:1 column of very small varices in distal esopahgus/MODERATE PORTAL GASTROPATHY IN PROXIMAL STOMACH/MODERATE erosive gastritis  . ESOPHAGOGASTRODUODENOSCOPY N/A 02/11/2014   Dr. Rourk:Esophageal varices with bleeding stigmata-status post esophageal band ligation therapy. Portal gastropathy  . ESOPHAGOGASTRODUODENOSCOPY N/A 07/04/2014   RMR: Persiting grade 2 esophageal varicies with bleeding stigmata status post band ligation. Significantly congested gastric mucosa iwith changes constistant with protal gastropathy.   . ESOPHAGOGASTRODUODENOSCOPY N/A 09/16/2014   Rehman: Single short column of varix proximal to GE  junction not large enough to be banded. Two amall ulcers at the GEJ felt to be source of GI Bleeding but no active bleeding but no actibe bleeding noted. No therapy rendered. Portal gastropathy NO evidence of peptic ulcer diease or gastric varices.   . ESOPHAGOGASTRODUODENOSCOPY N/A 12/14/2014   SLF: Grade ! esophageal varices. 2. Moderate Portal Gastropathy  . ESOPHAGOGASTRODUODENOSCOPY N/A 03/27/2015   Procedure: ESOPHAGOGASTRODUODENOSCOPY (EGD);  Surgeon: Danie Binder, MD;  [facility];  Service: Endoscopy;  Laterality: N/A;  1045am - moved to 817 @ 11:30  . ESOPHAGOGASTRODUODENOSCOPY N/A 06/05/2015   Procedure: ESOPHAGOGASTRODUODENOSCOPY (EGD);  Surgeon: Clarene Essex, MD;  Location: [facility] ENDOSCOPY;  Service: Endoscopy;  Laterality: N/A;  . ESOPHAGOGASTRODUODENOSCOPY N/A 07/23/2015   SLF:1. Grade 1 esophageal varices 2. Moderate portal hypertensive gastropathy 3. MILd non-erosive gastritis.   [name] ESOPHAGOGASTRODUODENOSCOPY N/A 03/04/2016   Dr. Oneida Alar: grade 1 varices, surveillance in Jan 2017   . ESOPHAGOGASTRODUODENOSCOPY  (EGD) WITH PROPOFOL N/A 07/24/2014   SLF:  1. 2 columns grade 2-3 varices- 2 Bands applied.  2.  Moderate gastropathy 3. Duodenal Diverticula  . ESOPHAGOGASTRODUODENOSCOPY (EGD) WITH PROPOFOL N/A 10/26/2014   RMR: 2 columns of grade 2 esophageal varices without obvious bleeding stigmata status post band ligation to complet obliteration of remaining varices.   . ESOPHAGOGASTRODUODENOSCOPY (EGD) WITH PROPOFOL N/A 04/30/2016   Procedure: ESOPHAGOGASTRODUODENOSCOPY (EGD) WITH PROPOFOL;  Surgeon: Daneil Dolin, MD;  [facility];  Service: Endoscopy;  Laterality: N/A;  . None    . PARACENTESIS  Feb 2015   1180 fluid, negative fluid analysis.   [name] PARACENTESIS  10/2013    Prior to Admission medications   Medication Sig Start Date End Date Taking? Authorizing Provider  midodrine (PROAMATINE) 10 MG tablet Take 1 tablet (10 mg total) by mouth daily. *Takes only on dialysis days on Tuesdays, Thursdays, and Saturdays (Sometimes takes treatments on Friday instead of Saturday)**Medication to keep your blood pressure from falling. Patient taking differently: Take 10 mg by mouth as directed. *Takes only on dialysis days on Tuesdays, Thursdays, and Saturdays (Sometimes takes treatments on Friday instead of Saturday)**Medication to keep your blood pressure from falling. 06/09/15  Yes Cherene Altes, MD  Oxycodone HCl 10 MG TABS 1 PO TID AS NEEDED FOR PAIN Patient taking differently: Take 10 mg by mouth 3 (three) times daily as needed. For pain 02/05/16  Yes Danie Binder, MD  clotrimazole (LOTRIMIN) 1 % cream APPLY TOPICALLY THREE TIMES DAILY FOR 10 DAYS 05/07/16   Annitta Needs, NP  Darbepoetin Alfa (ARANESP) 60 MCG/0.3ML SOSY injection Inject 0.3 mLs (60 mcg total) into the vein every Thursday with hemodialysis. 06/09/15   Cherene Altes, MD  lactulose ([place]) 10 GM/15ML solution TAKE 3 TEASPOONSFUL (15ML) BY MOUTH TWICE DAILY Patient taking differently: 7.5-15mls by mouth once daily 07/22/15    Mahala Menghini, PA-C  lidocaine (XYLOCAINE) 2 % solution TAKE TWO TEASPOONSFUL (10ML) BY MOUTH BEFORE MEALS AND AT BEDTIME TO PREVENT CHEST PAIN WHILE EATING 03/19/16   Carlis Stable, NP  lidocaine-prilocaine (EMLA) cream Apply 1 application topically as needed (for dialysis treatments).    Historical Provider, MD  LORazepam (ATIVAN) 1 MG tablet TAKE 1/2 TO 1 TABLET BY MOUTH TWICE DAILY AS NEEDED FOR BACK SPASM OR FOR ANXIETY 01/29/16   Danie Binder, MD  pantoprazole (PROTONIX) 40 MG tablet Take 1 tablet (40 mg total) by mouth 2 (two) times daily. Patient taking differently: Take 40 mg by mouth 2 (two)  times daily as needed (acid reflux).  09/19/14   Kathie Dike, MD  promethazine (PHENERGAN) 12.5 MG tablet TAKE ONE TABLET BY MOUTH EVERY 6 HOURS AS NEEDED FOR NAUSEA AND VOMITING. 09/06/15   Mahala Menghini, PA-C  RENVELA 800 MG tablet 1 TABLET 3 TIMES A DAY WITH MEALS 10/29/15   Historical Provider, MD  rifaximin (XIFAXAN) 550 MG TABS tablet Take 1 tablet (550 mg total) by mouth 2 (two) times daily before a meal. 05/02/16   Erline Hau, MD    Scheduled Meds: . sodium chloride  10 mL/hr Intravenous Once  . mouth rinse  15 mL Mouth Rinse BID  . [START ON 05/23/2016] pantoprazole  40 mg Intravenous Q12H  .  sodium bicarbonate  infusion 1000 mL   Intravenous Once  . sodium chloride flush  3 mL Intravenous Q12H   Infusions: . octreotide  (SANDOSTATIN)    IV infusion 50 mcg/hr (05/20/16 0647)  . pantoprozole (PROTONIX) infusion 8 mg/hr (05/20/16 0639)   PRN Meds: sodium chloride, sodium chloride, ondansetron (ZOFRAN) IV, promethazine, sodium chloride flush   Allergies as of 05/19/2016 - Review Complete 05/19/2016  Allergen Reaction Noted  . Lasix [furosemide]  04/12/2014  . Latex Itching 05/14/2015  . Morphine and related Hives and Itching 09/16/2014    Family History  Problem Relation Age of Onset  . Heart disease Mother   . Colon cancer Neg Hx   . Liver disease Neg Hx      Social History   Social History  . Marital status: Single    Spouse name: N/A  . Number of children: 1  . Years of education: N/A   Occupational History  . unemployed    Social History Main Topics  . Smoking status: Former Smoker    Packs/day: 0.25    Years: 20.00    Types: Cigarettes    Quit date: 11/05/2008  . Smokeless tobacco: Never Used     Comment: Never really smoked much  . Alcohol use No  . Drug use: No  . Sexual activity: Not Currently    Partners: Male    Birth control/ protection: None   Other Topics Concern  . Not on file   Social History Narrative  . No narrative on file    REVIEW OF SYSTEMS: Constitutional:  Weakness, fatigue ENT:  No nose bleeds Pulm:  Cough, some DOE.   CV:  No palpitations, no LE edema.  GU:  No hematuria, no frequency GI:  Per HPI Heme:  No bleeding from skin   Transfusions:  On many occasions, last ~ 04/29/16. Neuro:  No headaches, no peripheral tingling or numbness Derm:  No itching, no rash or sores.  Endocrine:  No sweats or chills.  No polyuria or dysuria Immunization:  Not queried.  Travel:  None beyond local counties in last few months.    PHYSICAL EXAM: Vital signs in last 24 hours: Vitals:   05/20/16 0715 05/20/16 0800  BP: (!) 109/52 (!) 108/49  Pulse: 98 (!) 104  Resp: 15 17  Temp: 97.4 F (36.3 C)    Wt Readings from Last 3 Encounters:  05/20/16 70.8 kg (156 lb 1.4 oz)  05/02/16 63.2 kg (139 lb 5.3 oz)  04/21/16 62.6 kg (138 lb)    General: ill, malnourished.  Head:  No asymmetry no swellin, no signs trauma  Eyes:  No icterus or pallor Ears:  Not HOH  Nose:  No discharge or congestion Mouth:  Clear, poor dentition.  Moist  MM Neck:  No mass, no TMG, no JVD Lungs:  Clear bil.  Slight cough Heart: RRR.  No mrg.  S1, s2 present Abdomen:  Umbilical hernia, distended, somewhat tender, not focal.   Rectal: deferred   Musc/Skeltl: no joint deformities or erythema Extremities:  2+ LE edema.   anasarca  Neurologic:  Oriented x 3.  No asterixis.  Moves all 4s, did not test strength.  Skin:  No rash or sores   Psych:  Bland affect, calm, cooperative.   Intake/Output from previous day: 10/10 0701 - 10/11 0700 In: 2443.7 [I.V.:511.8; YW:[id number]; IV Piggyback:400] Out: -  Intake/Output this shift: Total I/O In: 137.5 [Blood:137.5] Out: -   LAB RESULTS:  Recent Labs  05/19/16 1604 05/19/16 2130 05/20/16 0438  WBC 18.8* 18.5* 20.9*  HGB 3.9* 6.2* 6.5*  HCT 13.1* 19.0* 19.6*  PLT 227 209 200   BMET Lab Results  Component Value Date   NA 128 (L) 05/20/2016   NA 127 (L) 05/19/2016   NA 127 (L) 05/19/2016   K >7.5 (HH) 05/20/2016   K 7.2 (HH) 05/19/2016   K 6.4 (HH) 05/19/2016   CL 96 (L) 05/20/2016   CL 93 (L) 05/19/2016   CL 93 (L) 05/19/2016   CO2 14 (L) 05/20/2016   CO2 15 (L) 05/19/2016   CO2 11 (L) 05/19/2016   GLUCOSE 116 (H) 05/20/2016   GLUCOSE 125 (H) 05/19/2016   GLUCOSE 103 (H) 05/19/2016   BUN 195 (H) 05/20/2016   BUN 181 (H) 05/19/2016   BUN 209 (H) 05/19/2016   CREATININE 10.52 (H) 05/20/2016   CREATININE 10.65 (H) 05/19/2016   CREATININE 11.00 (H) 05/19/2016   CALCIUM 8.5 (L) 05/20/2016   CALCIUM 8.2 (L) 05/19/2016   CALCIUM 8.1 (L) 05/19/2016   LFT  Recent Labs  05/19/16 1510  PROT 4.4*  ALBUMIN 2.0*  AST 65*  ALT 32  ALKPHOS 93  BILITOT 1.2  BILIDIR 0.4  IBILI 0.8   PT/INR Lab Results  Component Value Date   INR 1.36 05/19/2016   INR 1.10 05/02/2016   INR 1.11 04/28/2016   Lipase     Component Value Date/Time   LIPASE 152 (H) 05/19/2016 1510    Drugs of Abuse     Component Value Date/Time   LABOPIA POSITIVE (A) 08/20/2015 0505   COCAINSCRNUR NONE DETECTED 08/20/2015 0505   LABBENZ POSITIVE (A) 08/20/2015 0505   AMPHETMU NONE DETECTED 08/20/2015 0505   THCU NONE DETECTED 08/20/2015 0505   LABBARB NONE DETECTED 08/20/2015 0505     RADIOLOGY STUDIES: Dg Chest 2 View  Result Date: 05/19/2016 CLINICAL DATA:   Fatigue and nausea for 2-3 days EXAM: CHEST  2 VIEW COMPARISON:  04/30/2016 FINDINGS: Low volume chest with interstitial crowding and atelectasis. There is no edema, consolidation, effusion, or pneumothorax. Normal heart size and mediastinal contours for technique. IMPRESSION: Low volume chest without acute finding. Electronically Signed   By: Monte Fantasia M.D.   On: 05/19/2016 15:44

## 2016-05-20 NOTE — Care Management Note (Signed)
Case Management Note  Patient Details  Name: VEYA PINEO MRN: [id number] Date of Birth: November 19, 1975  Subjective/Objective:         Pt admitted with shock/GIB  with recent GIB hospitlization          Action/Plan:  PTA independent from home with fiance.  Pt states her husband transports her to appts including HD.  Outpt HD center [facility] in [place].  CM will continue to follow for discharge needs   Expected Discharge Date:                  Expected Discharge Plan:  Home/Self Care  In-House Referral:     Discharge planning Services  CM Consult  Post Acute Care Choice:    Choice offered to:     DME Arranged:    DME Agency:     HH Arranged:    HH Agency:     Status of Service:  In process, will continue to follow  If discussed at Long Length of Stay Meetings, dates discussed:    Additional Comments:  Maryclare Labrador, RN 05/20/2016, 11:58 AM

## 2016-05-20 NOTE — Progress Notes (Signed)
Tx paused and machine relined d/t large venous chamber clot, arterial access clot appreciated upon disconnecting blood lines. Machine relined and treatment resumed.  Unable to return venous blood, 262mL.  Nephrology notified, bedside RN notified.  Will continue to monitor.

## 2016-05-20 NOTE — Progress Notes (Signed)
CRITICAL VALUE ALERT  Critical value received:  hgb 6.5  Date of notification:  05/20/2016  Time of notification:  [id number]  Critical value read back:Yes.    Nurse who received alert:  Fayrene Helper  MD notified (1st page):  Dr. Mortimer Fries  Time of first page:  [phone]  MD notified (2nd page):  Time of second page:  Responding MD:  Dr. Mortimer Fries  Time MD responded:  [phone]

## 2016-05-20 NOTE — Progress Notes (Signed)
Pharmacy Antibiotic Note  Wanda [name] is a 40 y.o. female admitted on 05/19/2016 with GI bleed.  Pharmacy has been consulted for Ceftazidime dosing for r/o sepsis with SBP as possible source. WBC is elevated. Pt is ESRD on HD   Plan: -Ceftazidime 2g IV x 1 now -F/U HD schedule for additional doses (please clarify--some notes say TTS and some say TTF)  Height: 5' (152.4 cm) Weight: 151 lb (68.5 kg) IBW/kg (Calculated) : 45.5  Temp (24hrs), Avg:97.5 F (36.4 C), Min:97.3 F (36.3 C), Max:98 F (36.7 C)   Recent Labs Lab 05/19/16 1510 05/19/16 1529 05/19/16 1604 05/19/16 1814 05/19/16 2130  WBC 19.7*  --  18.8*  --  18.5*  CREATININE 11.00*  --   --   --  10.65*  LATICACIDVEN  --  5.39*  --  5.98*  --     Estimated Creatinine Clearance: 5.9 mL/min (by C-G formula based on SCr of 10.65 mg/dL (H)).    Allergies  Allergen Reactions  . Lasix [Furosemide]     "doesn't work"  . Latex Itching  . Morphine And Related Hives and Itching    Narda Bonds 05/20/2016 12:21 AM

## 2016-05-20 NOTE — Consult Note (Signed)
Wanda [name] is an 40 y.o. female referred by Dr Ashok Cordia   Chief Complaint: ESRD, Hyperkalemia, Met acidosis HPI: 40yo WF with ESRD and cirrhosis sec NASH with GI bleed sec to MW tear transferred to [facility] after presenting to [facility] with weakness, nausea and melena.  Hg was 3.9 initially and given PRBC's.  She normally dialyses on a TTF schedule at [facility] as she has no transportation on Sat.  She has not had HD since last Thurs as she missed last Friday.  Past Medical History:  Diagnosis Date  . Acute blood loss anemia 02/25/2014   Status post transfusion  . Acute renal failure ([place]) 09/2013   Pre-renal- resolved  . Anasarca 10/10/2013  . Anxiety   . Bipolar disorder ([place]) 12/04/2013   2007-SEEN IN ED FOR INVOLUNTARY COMMITMENT, UDS POS FOR AMPHETAMINES/OPIATES   . Bleeding esophageal varices ([place]) 02/28/2014   s/p banding  . C. difficile colitis 04/19/2014  . Chronic hypotension   . Cirrhosis ([place]) 10/05/13   Liver bx 11/23/13 (delayed initially due to patient refusal). c/w steatohepatitis  . Cirrhosis of liver with ascites ([place])   . Depression   . ESRD (end stage renal disease) on dialysis ([place]) 08/2014  . Folate deficiency 09/2013  . Gastroesophageal junction ulcer 09/17/2014  . GERD (gastroesophageal reflux disease)   . Hematemesis/vomiting blood 02/24/2014  . Macrocytosis 02/28/2014  . PNA (pneumonia) 10/13/2013  . SBP (spontaneous bacterial peritonitis) ([place]) 11/10/2013  . Thrombocytopenia ([place])    Hypercellular bone marrow; abundant megakaryocytes per 08/27/2014; s/p bone marrow bx    Past Surgical History:  Procedure Laterality Date  . AV FISTULA PLACEMENT Right 11/16/2014   Procedure: Right arm Creation of arteriovenous fistula;  Surgeon: Angelia Mould, MD;  Location: [place];  Service: Vascular;  Laterality: Right;  . CENTRAL VENOUS CATHETER INSERTION Right   . COLONOSCOPY N/A 12/19/2013   SLF:NO OBVIOUS SOURCE FOR ANEMIA IDETIFIED/ONE COLON POLYP REMOVED/Small internal  hemorrhoids  . ESOPHAGEAL BANDING  07/04/2014   Procedure: ESOPHAGEAL BANDING;  Surgeon: Daneil Dolin, MD;  [facility];  Service: Endoscopy;;  . ESOPHAGEAL BANDING N/A 07/24/2014   Procedure: ESOPHAGEAL BANDING (2 bands applied);  Surgeon: Danie Binder, MD;  Location: [facility] OR[facility];  Service: Endoscopy;  Laterality: N/A;  . ESOPHAGEAL BANDING N/A 07/23/2015   Procedure: ESOPHAGEAL BANDING;  Surgeon: Danie Binder, MD;  [facility];  Service: Endoscopy;  Laterality: N/A;  . ESOPHAGEAL BANDING N/A 03/04/2016   Procedure: ESOPHAGEAL BANDING;  Surgeon: Danie Binder, MD;  [facility];  Service: Endoscopy;  Laterality: N/A;  . ESOPHAGEAL BANDING N/A 04/30/2016   Procedure: ESOPHAGEAL BANDING;  Surgeon: Daneil Dolin, MD;  [facility];  Service: Endoscopy;  Laterality: N/A;  . ESOPHAGOGASTRODUODENOSCOPY N/A 11/14/2013   SLF:1 column of very small varices in distal esopahgus/MODERATE PORTAL GASTROPATHY IN PROXIMAL STOMACH/MODERATE erosive gastritis  . ESOPHAGOGASTRODUODENOSCOPY N/A 02/11/2014   Dr. Rourk:Esophageal varices with bleeding stigmata-status post esophageal band ligation therapy. Portal gastropathy  . ESOPHAGOGASTRODUODENOSCOPY N/A 07/04/2014   RMR: Persiting grade 2 esophageal varicies with bleeding stigmata status post band ligation. Significantly congested gastric mucosa iwith changes constistant with protal gastropathy.   . ESOPHAGOGASTRODUODENOSCOPY N/A 09/16/2014   Rehman: Single short column of varix proximal to GE junction not large enough to be banded. Two amall ulcers at the GEJ felt to be source of GI Bleeding but no active bleeding but no actibe bleeding noted. No therapy rendered. Portal gastropathy  NO evidence of peptic ulcer diease or gastric varices.   . ESOPHAGOGASTRODUODENOSCOPY N/A 12/14/2014   SLF: Grade ! esophageal varices. 2. Moderate Portal Gastropathy  . ESOPHAGOGASTRODUODENOSCOPY N/A 03/27/2015   Procedure:  ESOPHAGOGASTRODUODENOSCOPY (EGD);  Surgeon: Danie Binder, MD;  [facility];  Service: Endoscopy;  Laterality: N/A;  1045am - moved to 817 @ 11:30  . ESOPHAGOGASTRODUODENOSCOPY N/A 06/05/2015   Procedure: ESOPHAGOGASTRODUODENOSCOPY (EGD);  Surgeon: Clarene Essex, MD;  Location: [facility] ENDOSCOPY;  Service: Endoscopy;  Laterality: N/A;  . ESOPHAGOGASTRODUODENOSCOPY N/A 07/23/2015   SLF:1. Grade 1 esophageal varices 2. Moderate portal hypertensive gastropathy 3. MILd non-erosive gastritis.   [name] ESOPHAGOGASTRODUODENOSCOPY N/A 03/04/2016   Dr. Oneida Alar: grade 1 varices, surveillance in Jan 2017   . ESOPHAGOGASTRODUODENOSCOPY (EGD) WITH PROPOFOL N/A 07/24/2014   SLF:  1. 2 columns grade 2-3 varices- 2 Bands applied.  2.  Moderate gastropathy 3. Duodenal Diverticula  . ESOPHAGOGASTRODUODENOSCOPY (EGD) WITH PROPOFOL N/A 10/26/2014   RMR: 2 columns of grade 2 esophageal varices without obvious bleeding stigmata status post band ligation to complet obliteration of remaining varices.   . ESOPHAGOGASTRODUODENOSCOPY (EGD) WITH PROPOFOL N/A 04/30/2016   Procedure: ESOPHAGOGASTRODUODENOSCOPY (EGD) WITH PROPOFOL;  Surgeon: Daneil Dolin, MD;  [facility];  Service: Endoscopy;  Laterality: N/A;  . None    . PARACENTESIS  Feb 2015   1180 fluid, negative fluid analysis.   [name] PARACENTESIS  10/2013    Family History  Problem Relation Age of Onset  . Heart disease Mother   . Colon cancer Neg Hx   . Liver disease Neg Hx    Social History:  reports that she quit smoking about 7 years ago. Her smoking use included Cigarettes. She has a 5.00 pack-year smoking history. She has never used smokeless tobacco. She reports that she does not drink alcohol or use drugs.  Allergies:  Allergies  Allergen Reactions  . Lasix [Furosemide]     "doesn't work"  . Latex Itching  . Morphine And Related Hives and Itching    Medications Prior to Admission  Medication Sig Dispense Refill  . midodrine (PROAMATINE)  10 MG tablet Take 1 tablet (10 mg total) by mouth daily. *Takes only on dialysis days on Tuesdays, Thursdays, and Saturdays (Sometimes takes treatments on Friday instead of Saturday)**Medication to keep your blood pressure from falling. (Patient taking differently: Take 10 mg by mouth as directed. *Takes only on dialysis days on Tuesdays, Thursdays, and Saturdays (Sometimes takes treatments on Friday instead of Saturday)**Medication to keep your blood pressure from falling.) 60 tablet 3  . Oxycodone HCl 10 MG TABS 1 PO TID AS NEEDED FOR PAIN (Patient taking differently: Take 10 mg by mouth 3 (three) times daily as needed. For pain) 90 tablet 0  . clotrimazole (LOTRIMIN) 1 % cream APPLY TOPICALLY THREE TIMES DAILY FOR 10 DAYS 60 g 0  . Darbepoetin Alfa (ARANESP) 60 MCG/0.3ML SOSY injection Inject 0.3 mLs (60 mcg total) into the vein every Thursday with hemodialysis. 4.2 mL   . lactulose (CHRONULAC) 10 GM/15ML solution TAKE 3 TEASPOONSFUL (15ML) BY MOUTH TWICE DAILY (Patient taking differently: 7.5-15mls by mouth once daily) 946 mL 5  . lidocaine (XYLOCAINE) 2 % solution TAKE TWO TEASPOONSFUL (10ML) BY MOUTH BEFORE MEALS AND AT BEDTIME TO PREVENT CHEST PAIN WHILE EATING 500 mL 1  . lidocaine-prilocaine (EMLA) cream Apply 1 application topically as needed (for dialysis treatments).    . LORazepam (ATIVAN) 1 MG tablet TAKE 1/2 TO 1 TABLET BY MOUTH TWICE DAILY  AS NEEDED FOR BACK SPASM OR FOR ANXIETY 60 tablet 3  . pantoprazole (PROTONIX) 40 MG tablet Take 1 tablet (40 mg total) by mouth 2 (two) times daily. (Patient taking differently: Take 40 mg by mouth 2 (two) times daily as needed (acid reflux). ) 60 tablet 2  . promethazine (PHENERGAN) 12.5 MG tablet TAKE ONE TABLET BY MOUTH EVERY 6 HOURS AS NEEDED FOR NAUSEA AND VOMITING. 30 tablet 0  . RENVELA 800 MG tablet 1 TABLET 3 TIMES A DAY WITH MEALS  5  . rifaximin (XIFAXAN) 550 MG TABS tablet Take 1 tablet (550 mg total) by mouth 2 (two) times daily before a  meal. 60 tablet 2     Lab Results: UA: ND  Recent Labs  05/19/16 1604 05/19/16 2130 05/20/16 0438  WBC 18.8* 18.5* 20.9*  HGB 3.9* 6.2* 6.5*  HCT 13.1* 19.0* 19.6*  PLT 227 209 200   BMET  Recent Labs  05/19/16 1510 05/19/16 2130 05/20/16 0438  NA 127* 127* 128*  K 6.4* 7.2* >7.5*  CL 93* 93* 96*  CO2 11* 15* 14*  GLUCOSE 103* 125* 116*  BUN 209* 181* 195*  CREATININE 11.00* 10.65* 10.52*  CALCIUM 8.1* 8.2* 8.5*   LFT  Recent Labs  05/19/16 1510  PROT 4.4*  ALBUMIN 2.0*  AST 65*  ALT 32  ALKPHOS 93  BILITOT 1.2  BILIDIR 0.4  IBILI 0.8   Dg Chest 2 View  Result Date: 05/19/2016 CLINICAL DATA:  Fatigue and nausea for 2-3 days EXAM: CHEST  2 VIEW COMPARISON:  04/30/2016 FINDINGS: Low volume chest with interstitial crowding and atelectasis. There is no edema, consolidation, effusion, or pneumothorax. Normal heart size and mediastinal contours for technique. IMPRESSION: Low volume chest without acute finding. Electronically Signed   By: Monte Fantasia M.D.   On: 05/19/2016 15:44    ROS: No change in vision + nausea No SOB No CP + abd pain Generalized weakness   PHYSICAL EXAM: Blood pressure (!) 108/49, pulse (!) 104, temperature 97.4 F (36.3 C), temperature source Oral, resp. rate 17, height 5' (1.524 m), weight 70.8 kg (156 lb 1.4 oz), last menstrual period 09/19/2013, SpO2 100 %. HEENT: PERRLA EOMI.  Conjunctiva pale NECK:No JVD LUNGS:Decreased BS bases but clear CARDIAC:Sl tachy, regular 1/6 systolic M LSB ABD:+ BS, + distention, mild tenderness to deep palpation, umbilical hernia, non tender EXT:1-2+ edema.  RUA AVF + bruit NEURO:CNI Ox3 mild asterixis  Assessment: 1. ESRD on HD TTF( due to transportation issues) 2. Hyperkalemia 3. Met acidosis 4. Anemia sec GI bleed 5. NASH PLAN: 1. HD ASAP 2. Will most likely plan HD tomorrow as well 3. Could probably DC bicarb gtt after HD 4. Resume midodrine when able to take PO 5. Plan to  transfuse 1 unit PRBC on HD 6. Follow H/H   Courtni Balash T 05/20/2016, 8:40 AM

## 2016-05-20 NOTE — Progress Notes (Signed)
[facility] Progress Note Patient Name: GITEL NISSLEY DOB: [birth year] MRN: [id number]   Date of Service  05/20/2016  HPI/Events of Note  K=7.5 despite therapy  eICU Interventions  Patient will need HD      Intervention Category Minor Interventions: Electrolytes abnormality - evaluation and management  Kolbi Altadonna 05/20/2016, 6:10 AM

## 2016-05-20 NOTE — Procedures (Signed)
Pt seen on HD.  BFR 300.  Ap 110  Vp 100.  BP low and may not get much fluid off.  She is due to get another unit of blood with HD

## 2016-05-20 NOTE — Progress Notes (Signed)
PULMONARY / CRITICAL CARE MEDICINE   Name: Wanda [name] MRN: [id number] DOB: 04-02-1976    ADMISSION DATE:  05/19/2016 CONSULTATION DATE:  05/19/2016  REFERRING MD:  Dr. Rolan [facility]  CHIEF COMPLAINT:  GIB  HISTORY OF PRESENT ILLNESS:   40 year old female with complex past medical history as below, which is significant for NASH cirrhosis with recent UGIB and Mallory-Weiss tear 04/2016, ESRD on HD(TTF), chronic hypotension, SBP, and Bipolar disorder. She was admitted 9/20 with melena and coffee ground emesis. She had skipped several dialysis treatments and became nauseated and had dry heaves/wretching. Anemia with hemoglobin to 6.4 requiring 2 units PRBC. EGD the following day demonstrated Mallory-Weiss tear with adherent clot, and varices that remain obliterated from prior treatments. Conservative management was recommended. Bleeding had improved labs normalized so she was discharged to home 9/23 after therapeutic paracentesis.  10/10 she presented to [facility] ED with complaints of weakness and nausea x 2 days. Also complaining of some dark stools. She was hypotensive in ED and iSTAT lactic acid 5.3 so code sepsis was initiated and NS bolus 105ml/kg ordered, but stopped because hemoglobin resulted 3.9. Antibiotics were ordered for infectious concern. 2 units PRBC also ordered. INR WNL. Due to critical illness and likely need for urgent endoscopy, Mrs. Slappey will be transported to [facility] for ICU admission.   SUBJECTIVE: No acute events overnight. Patient reporting some diffuse pain in her arms and shoulders. Abdominal pain only with movement. Denies any nausea at this time. No chest pain or pressure.   REVIEW OF SYSTEMS:  No dyspnea or cough. No fever or chills. No headache or near syncope.   VITAL SIGNS: BP (!) 109/52   Pulse 98   Temp 97.4 F (36.3 C) (Oral)   Resp 15   Ht 5' (1.524 m)   Wt 156 lb 1.4 oz (70.8 kg)   LMP 09/19/2013   SpO2 100%   BMI 30.48 kg/m    HEMODYNAMICS:    VENTILATOR SETTINGS:    INTAKE / OUTPUT: I/O last 3 completed shifts: In: 2443.7 [I.V.:511.8; UI:266091; Other:70; IV Piggyback:400] Out: -   PHYSICAL EXAMINATION: General:  Ill-appearing female. No distress. Awake.  Neuro:  Alert and oriented x 4. Grossly nonfocal. CN in tact. HEENT:  Moist membranes. No scleral injection.  Cardiovascular:  Regular rhythm. No edema. No appreciable JVD. Lungs:  Slightly diminished breath sounds in the bases. Normal work of breathing on nasal cannula oxygen.  Abdomen:  Soft. Protuberant. Mildly tender diffusely to palpation. Musculoskeletal:  No joint effusion or deformity. Skin:  Warm & dry. No rash on exposed skin.   LABS:  BMET  Recent Labs Lab 05/19/16 1510 05/19/16 2130 05/20/16 0438  NA 127* 127* 128*  K 6.4* 7.2* >7.5*  CL 93* 93* 96*  CO2 11* 15* 14*  BUN 209* 181* 195*  CREATININE 11.00* 10.65* 10.52*  GLUCOSE 103* 125* 116*    Electrolytes  Recent Labs Lab 05/19/16 1400 05/19/16 1510 05/19/16 2130 05/20/16 0438  CALCIUM  --  8.1* 8.2* 8.5*  MG 3.1*  --   --   --     CBC  Recent Labs Lab 05/19/16 1604 05/19/16 2130 05/20/16 0438  WBC 18.8* 18.5* 20.9*  HGB 3.9* 6.2* 6.5*  HCT 13.1* 19.0* 19.6*  PLT 227 209 200    Coag's  Recent Labs Lab 05/19/16 1510  INR 1.36    Sepsis Markers  Recent Labs Lab 05/19/16 1529 05/19/16 1814  LATICACIDVEN 5.39* 5.98*  ABG No results for input(s): PHART, PCO2ART, PO2ART in the last 168 hours.  Liver Enzymes  Recent Labs Lab 05/19/16 1510  AST 65*  ALT 32  ALKPHOS 93  BILITOT 1.2  ALBUMIN 2.0*    Cardiac Enzymes  Recent Labs Lab 05/19/16 1551 05/19/16 2130 05/20/16 0438  TROPONINI <0.03 <0.03 <0.03    Glucose  Recent Labs Lab 05/19/16 1518 05/19/16 1646 05/19/16 2031  GLUCAP 105* 106* 117*    Imaging Dg Chest 2 View  Result Date: 05/19/2016 CLINICAL DATA:  Fatigue and nausea for 2-3 days EXAM: CHEST  2  VIEW COMPARISON:  04/30/2016 FINDINGS: Low volume chest with interstitial crowding and atelectasis. There is no edema, consolidation, effusion, or pneumothorax. Normal heart size and mediastinal contours for technique. IMPRESSION: Low volume chest without acute finding. Electronically Signed   By: Monte Fantasia M.D.   On: 05/19/2016 15:44     STUDIES:  EGD 9/21: Mallory-Weiss tear without active bleeding currently. Esophageal mucosa scars present ?varices pretty well obliterated. Hiatal hernia. Congested portal gastropathy  MICROBIOLOGY: MRSA PCR 10/10:  Negative  Blood Ctx x2 10/10 >>  ANTIBIOTICS: Ceftaz 10/10 - 10/11 Zosyn 10/10>> Vancomycin 10/10>>  SIGNIFICANT EVENTS: 9/20-9/23 admit to [facility] for mallory weiss tear, clotted on EGD and managed conservatively.  10/10 - admit with severe ABLA  LINES/TUBES: PIV x3  ASSESSMENT / PLAN:  PULMONARY A: Acute Hypoxic Respiratory Failure - Likely secondary to volume.  P:   Weaning FiO2 for Sat >92% Continuous pulse oximetry  CARDIOVASCULAR A:  Shock - Hemorrhagic. Resolved with volume and blood products.  P:  Continuous telemetry monitoring Vitals per unit protocol MAP goal > 50mmHg  RENAL A:   ESRD on HD (TTF) Hyperkalemia - Profound. Hyponatremia - Mild. Hypochloremia - Mild. Metabolic Acidosis Lactic Acidosis   P:   Consulting Nephrology for emergent HD Monitoring electrolytes closely Replacing electrolytes as indicated Trending Lactic Acid q6hr Increasing Sodium Bicarb gtt to 150 cc/hr  GASTROINTESTINAL A:   Gastrointestinal Bleed - Recent Mallory-Weiss tear. H/O NASH Cirrhosis Protein-Calorie Malnutrition H/O SBP H/O Varices w/ banding  P:   NPO GI Already notified and plan to see Octreotide gtt Protonix gtt Checking Complete Abdominal Ultrasound  HEMATOLOGIC A:   Anemia - Secondary to acute blood loss. S/P 3u PRBC. H/O Anemia of Chronic Illness Leukocytosis - Multifactorial.  P:  S/P  DDAVP Trending Hgb/Hct q6hr Trending cell counts daily Currently transfusing 4th unit PRBC SCDs  INFECTIOUS A:   SIRS - No clear source of infection. H/O SBP  P:   Discontinue Fortaz Continuing Empiric Vancomycin & Zosyn Day #2 Awaiting Cultures Trending Procalcitonin per Algorithm   ENDOCRINE A:   No acute issues    P:   Monitor glucose on chemistries  NEUROLOGIC A:   Acute Encephalopathy - Multfactorial. Improving. Likely due to anemia & hypotension. NH4 34.   P:   RASS goal: 0 Monitor closely  FAMILY  - Updates: No family at bedside 10/10. Patient updated at bedside.   - Inter-disciplinary family meet or Palliative Care meeting due by:  10/17  TODAY'S SUMMARY:  40 y.o. female with NASH cirrhosis and ESRD. History GIB with recent mallory weiss tear. Now presenting with hemorrhagic shock likely due to recurrent GI bleeding. Awaiting evaluation by GI this morning.   I have spent a total of 46 minutes of critical care time today caring for the patient, contacting Nephrology to consult, and reviewing the patient's electronic medical record.  Sonia Baller Ashok Cordia, M.D. [facility] Pulmonary &  Critical Care Pager:  [phone] After 3pm or if no response, call [phone] 05/20/2016 7:58 AM

## 2016-05-20 NOTE — Progress Notes (Signed)
PULMONARY / CRITICAL CARE MEDICINE   Name: Wanda [name] MRN: [id number] DOB: [birth year]    ADMISSION DATE:  05/19/2016   CHIEF COMPLAINT:  shock  HISTORY OF PRESENT ILLNESS:   40 year old female with complex past medical history as below, which is significant for NASH cirrhosis with recent UGIB and Mallory-Weiss tear 04/2016, Esophageal Varices, ESRD on HD(TTF), chronic hypotension, SBP, Thrombocytopenia, and Bipolar disorder. She was admitted 9/20 with melena and coffee ground emesis. She had skipped several dialysis treatments and became nauseated and had dry heaves/wretching. Anemia with hemoglobin to 6.4 requiring 2 units PRBC. EGD the following day demonstrated Mallory-Weiss tear with adherent clot, and varices that remain obliterated from prior treatments. Conservative management was recommended. Bleeding had improved labs normalized so she was discharged to home 9/23 after therapeutic paracentesis.  10/10 she presented to [facility] ED with complaints of weakness and nausea x 2 days. Also complaining of some dark stools. She was hypotensive in ED and iSTAT lactic acid 5.3 so code sepsis was initiated and NS bolus 43ml/kg ordered, but stopped because hemoglobin resulted 3.9. Antibiotics were ordered for infectious concern. 2 units PRBC also ordered. INR WNL. Due to critical illness and likely need for urgent endoscopy, Wanda [name] will be transported to [facility] for ICU admission.   PAST MEDICAL HISTORY :  She  has a past medical history of Acute blood loss anemia (02/25/2014); Acute renal failure ([place]) (09/2013); Anasarca (10/10/2013); Anxiety; Bipolar disorder ([place]) (12/04/2013); Bleeding esophageal varices ([facility]) (02/28/2014); C. difficile colitis (04/19/2014); Chronic hypotension; Cirrhosis ([place]) (10/05/13); Cirrhosis of liver with ascites ([place]); Depression; ESRD (end stage renal disease) on dialysis ([place]) (08/2014); Folate deficiency (09/2013); Gastroesophageal junction ulcer (09/17/2014); GERD  (gastroesophageal reflux disease); Hematemesis/vomiting blood (02/24/2014); Macrocytosis (02/28/2014); PNA (pneumonia) (10/13/2013); SBP (spontaneous bacterial peritonitis) ([place]) (11/10/2013); and Thrombocytopenia ([place]).  PAST SURGICAL HISTORY: She  has a past surgical history that includes None; Paracentesis (Feb 2015); Esophagogastroduodenoscopy (N/A, 11/14/2013); Paracentesis (10/2013); Colonoscopy (N/A, 12/19/2013); Esophagogastroduodenoscopy (N/A, 02/11/2014); Esophagogastroduodenoscopy (N/A, 07/04/2014); esophageal banding (07/04/2014); Esophagogastroduodenoscopy (egd) with propofol (N/A, 07/24/2014); esophageal banding (N/A, 07/24/2014); Central venous catheter insertion (Right); Esophagogastroduodenoscopy (N/A, 09/16/2014); Esophagogastroduodenoscopy (egd) with propofol (N/A, 10/26/2014); AV fistula placement (Right, 11/16/2014); Esophagogastroduodenoscopy (N/A, 12/14/2014); Esophagogastroduodenoscopy (N/A, 03/27/2015); Esophagogastroduodenoscopy (N/A, 06/05/2015); Esophagogastroduodenoscopy (N/A, 07/23/2015); esophageal banding (N/A, 07/23/2015); Esophagogastroduodenoscopy (N/A, 03/04/2016); esophageal banding (N/A, 03/04/2016); Esophagogastroduodenoscopy (egd) with propofol (N/A, 04/30/2016); and esophageal banding (N/A, 04/30/2016).  Allergies  Allergen Reactions  . Lasix [Furosemide]     "doesn't work"  . Latex Itching  . Morphine And Related Hives and Itching    No current facility-administered medications on file prior to encounter.    Current Outpatient Prescriptions on File Prior to Encounter  Medication Sig  . midodrine (PROAMATINE) 10 MG tablet Take 1 tablet (10 mg total) by mouth daily. *Takes only on dialysis days on Tuesdays, Thursdays, and Saturdays (Sometimes takes treatments on Friday instead of Saturday)**Medication to keep your blood pressure from falling. (Patient taking differently: Take 10 mg by mouth as directed. *Takes only on dialysis days on Tuesdays, Thursdays, and Saturdays (Sometimes  takes treatments on Friday instead of Saturday)**Medication to keep your blood pressure from falling.)  . Oxycodone HCl 10 MG TABS 1 PO TID AS NEEDED FOR PAIN (Patient taking differently: Take 10 mg by mouth 3 (three) times daily as needed. For pain)  . clotrimazole (LOTRIMIN) 1 % cream APPLY TOPICALLY THREE TIMES DAILY FOR 10 DAYS  . Darbepoetin Alfa (ARANESP) 60 MCG/0.3ML SOSY injection Inject 0.3 mLs (60 mcg  total) into the vein every Thursday with hemodialysis.  [name] lactulose (CHRONULAC) 10 GM/15ML solution TAKE 3 TEASPOONSFUL (15ML) BY MOUTH TWICE DAILY (Patient taking differently: 7.5-15mls by mouth once daily)  . lidocaine (XYLOCAINE) 2 % solution TAKE TWO TEASPOONSFUL (10ML) BY MOUTH BEFORE MEALS AND AT BEDTIME TO PREVENT CHEST PAIN WHILE EATING  . lidocaine-prilocaine (EMLA) cream Apply 1 application topically as needed (for dialysis treatments).  . LORazepam (ATIVAN) 1 MG tablet TAKE 1/2 TO 1 TABLET BY MOUTH TWICE DAILY AS NEEDED FOR BACK SPASM OR FOR ANXIETY  . pantoprazole (PROTONIX) 40 MG tablet Take 1 tablet (40 mg total) by mouth 2 (two) times daily. (Patient taking differently: Take 40 mg by mouth 2 (two) times daily as needed (acid reflux). )  . promethazine (PHENERGAN) 12.5 MG tablet TAKE ONE TABLET BY MOUTH EVERY 6 HOURS AS NEEDED FOR NAUSEA AND VOMITING.  . RENVELA 800 MG tablet 1 TABLET 3 TIMES A DAY WITH MEALS  . rifaximin (XIFAXAN) 550 MG TABS tablet Take 1 tablet (550 mg total) by mouth 2 (two) times daily before a meal.    FAMILY HISTORY:  Her indicated that her mother is deceased. She indicated that her father is deceased. She indicated that her sister is alive. She indicated that her brother is alive. She indicated that the status of her neg hx is unknown.    SOCIAL HISTORY: She  reports that she quit smoking about 7 years ago. Her smoking use included Cigarettes. She has a 5.00 pack-year smoking history. She has never used smokeless tobacco. She reports that she does not  drink alcohol or use drugs.  REVIEW OF SYSTEMS:   Reports chest discomfort but better than earlier when she presented; this is a sharp pain that starts in her left breast and comes to center of chest. No SOB, no current nausea. Also reports of abdominal and back pain.   SUBJECTIVE:  Overnight received 2 more units of pRBC for hemoglobin of 6.2 and 6.5. Potassium went up to > 7.5 despite therapy with and therefore HD was indicated. Nephrology called this AM.   VITAL SIGNS: BP (!) 106/49   Pulse (!) 101   Temp 97.5 F (36.4 C) (Oral)   Resp (!) 27   Ht 5' (1.524 m)   Wt 70.8 kg (156 lb 1.4 oz)   LMP 09/19/2013   SpO2 100%   BMI 30.48 kg/m   HEMODYNAMICS:    VENTILATOR SETTINGS:    INTAKE / OUTPUT: I/O last 3 completed shifts: In: [id number] [Blood:670; IV Piggyback:200] Out: -   PHYSICAL EXAMINATION: GEN: seems in some discomfort  HEENT: Atraumatic, normocephalic, neck supple, EOMI, sclera clear, PERRL, somewhat dry MM CV: RRR,systolic murmur noted  PULM: CTAB, normal effort ABD: distended, abdominal hernia noted in left lower quadrant of abdomen. Diffuse tenderness of abdomen, + BS SKIN: No rash or cyanosis; warm and well-perfused EXTR: pitting edema of LE; able to move all extremities  NEURO: Awake, alert, no focal deficits grossly, normal speech   LABS:  BMET  Recent Labs Lab 05/19/16 1510 05/19/16 2130 05/20/16 0438  NA 127* 127* 128*  K 6.4* 7.2* >7.5*  CL 93* 93* 96*  CO2 11* 15* 14*  BUN 209* 181* 195*  CREATININE 11.00* 10.65* 10.52*  GLUCOSE 103* 125* 116*    Electrolytes  Recent Labs Lab 05/19/16 1400 05/19/16 1510 05/19/16 2130 05/20/16 0438  CALCIUM  --  8.1* 8.2* 8.5*  MG 3.1*  --   --   --  CBC  Recent Labs Lab 05/19/16 1604 05/19/16 2130 05/20/16 0438  WBC 18.8* 18.5* 20.9*  HGB 3.9* 6.2* 6.5*  HCT 13.1* 19.0* 19.6*  PLT 227 209 200    Coag's  Recent Labs Lab 05/19/16 1510  INR 1.36    Sepsis Markers  Recent  Labs Lab 05/19/16 1529 05/19/16 1814  LATICACIDVEN 5.39* 5.98*    ABG No results for input(s): PHART, PCO2ART, PO2ART in the last 168 hours.  Liver Enzymes  Recent Labs Lab 05/19/16 1510  AST 65*  ALT 32  ALKPHOS 93  BILITOT 1.2  ALBUMIN 2.0*    Cardiac Enzymes  Recent Labs Lab 05/19/16 1551 05/19/16 2130 05/20/16 0438  TROPONINI <0.03 <0.03 <0.03    Glucose  Recent Labs Lab 05/19/16 1518 05/19/16 1646 05/19/16 2031  GLUCAP 105* 106* 117*    Imaging Dg Chest 2 View  Result Date: 05/19/2016 CLINICAL DATA:  Fatigue and nausea for 2-3 days EXAM: CHEST  2 VIEW COMPARISON:  04/30/2016 FINDINGS: Low volume chest with interstitial crowding and atelectasis. There is no edema, consolidation, effusion, or pneumothorax. Normal heart size and mediastinal contours for technique. IMPRESSION: Low volume chest without acute finding. Electronically Signed   By: Monte Fantasia M.D.   On: 05/19/2016 15:44     STUDIES:  EGD 9/21 > Mallory-Weiss tear without active bleeding currently. Esophageal mucosa scars present ?varices pretty well obliterated. Hiatal hernia. Congested portal gastropathy CXR 10/10: low lung volume, no acute finding   CULTURES: BCx2 10/10 > Urine 10/10 >  ANTIBIOTICS: Zosyn 10/10  In ED; 10/11 > Vancomycin 10/10 in ED; 10/11>> Ceftaz 10/10  SIGNIFICANT EVENTS: 9/20-9/23 admit to [facility] for mallory weiss tear, clotted on EGD and managed conservatively.  10/10 admit with severe ABLA  LINES/TUBES: AV Fistula R upper Arm PIV x 3   ASSESSMENT / PLAN:  40 year old female with PMH of NASH cirrhosis with recent UGIB and Mallory-Weiss tear 04/2016, ESRD on HD(TTF), chronic hypotension, SBP, and Bipolar disorder. She presented on 10/10 with weakness and nausea x 2 days, dark stools and was found to be hypotensive, lactic acidosis, and significant anemia thought to be due to hemorrhageic shock likely due to recurrent GI bleed.   ASSESSMENT /  PLAN:  PULMONARY A: No acute issues  P:   Supplemental O2 in setting profound anemia.   CARDIOVASCULAR A:  Hemorrhagic shock > improved with volume and minimal blood products. S/p 3 units. Receiving 4th unit  Chest Pain, Intermittent: EKG without signs of ischemia, troponin negative x 3.   P:  MAP goal > 69mmHg Blood products for MAP goal Consider norepinephrine if not meeting goals See GI  RENAL A:   ESRD on HD (TTF): missed two sessions of HD as outpatient prior to admission Hyperkalemia: EKG this AM without peaked T waves  Hyponatremia Hypochloremia High AG acidosis - lactic acidosis  P:   Close monitoring with blood transfusion.  Nephrology Consulted, appreciate recommendations  Sodium bicarb infusion started this AM Trend lactic acid levels  GASTROINTESTINAL A:   Gastrointestinal bleeding with recent history of Mallory-Weiss tear NASH cirrhosis Protein calorie malnutrition  P:   NPO Protonix infusion Octreotide infusion GI consulted, appreciate recommendations Abdominal US to evaluate for ascites   HEMATOLOGIC A:   Acute blood loss anemia: s/p 3 units. 1 more unit transfusing now. S/p DDAVP x 1 Anemia of chronic illness (Hgb 10 at time of recent discharge)  P:  1 more unit transfusing; will obtain post-transfusion hgb  Follow CBC  Transfuse for goal hemoglobin > 7   INFECTIOUS A:   SIRS, no clear infectious source, must consider SBP  P:   Will restart Vanc and Zosyn for broader coverage since source is currently unknown.  Cultures Abdominal US to evaluate ascites   ENDOCRINE A:   No acute issues    P:   Monitor glucose on chemistries  NEUROLOGIC A:   Acute metabolic encephalopathy, likely hepatic.: Resolved this AM, Ammonia level within normal limits.   P:   RASS goal: 0 Monitor closely    FAMILY   - Inter-disciplinary family meet or Palliative Care meeting due by:  10/17    Smiley Houseman, MD PGY 2  Family Medicine   05/20/2016, 6:57 AM

## 2016-05-21 ENCOUNTER — Inpatient Hospital Stay (HOSPITAL_COMMUNITY): Payer: Medicare Other | Admitting: Anesthesiology

## 2016-05-21 ENCOUNTER — Encounter (HOSPITAL_COMMUNITY)
Admission: EM | Disposition: A | Discharge status: Home Health | Payer: Self-pay | Source: Home / Self Care | Attending: Internal Medicine

## 2016-05-21 ENCOUNTER — Encounter (HOSPITAL_COMMUNITY): Payer: Self-pay | Admitting: Anesthesiology

## 2016-05-21 DIAGNOSIS — K226 Gastro-esophageal laceration-hemorrhage syndrome: Secondary | ICD-10-CM

## 2016-05-21 HISTORY — PX: ESOPHAGOGASTRODUODENOSCOPY: SHX5428

## 2016-05-21 LAB — CBC WITH DIFFERENTIAL/PLATELET
BASOS ABS: 0.1 10*3/uL (ref 0.0–0.1)
BASOS ABS: 0.1 10*3/uL (ref 0.0–0.1)
BASOS PCT: 1 %
BASOS PCT: 1 %
Basophils Absolute: 0.2 10*3/uL — ABNORMAL HIGH (ref 0.0–0.1)
Basophils Relative: 0 %
EOS PCT: 3 %
EOS PCT: 5 %
EOS PCT: 5 %
Eosinophils Absolute: 0.5 10*3/uL (ref 0.0–0.7)
Eosinophils Absolute: 0.7 10*3/uL (ref 0.0–0.7)
Eosinophils Absolute: 0.8 10*3/uL — ABNORMAL HIGH (ref 0.0–0.7)
HCT: 19.6 % — ABNORMAL LOW (ref 36.0–46.0)
HCT: 23.3 % — ABNORMAL LOW (ref 36.0–46.0)
HEMATOCRIT: 20.1 % — AB (ref 36.0–46.0)
HEMOGLOBIN: 7.1 g/dL — AB (ref 12.0–15.0)
Hemoglobin: 6.7 g/dL — CL (ref 12.0–15.0)
Hemoglobin: 7.8 g/dL — ABNORMAL LOW (ref 12.0–15.0)
LYMPHS ABS: 1.8 10*3/uL (ref 0.7–4.0)
LYMPHS PCT: 12 %
LYMPHS PCT: 13 %
Lymphocytes Relative: 10 %
Lymphs Abs: 2 10*3/uL (ref 0.7–4.0)
Lymphs Abs: 1.5 10*3/uL (ref 0.7–4.0)
MCH: 31.1 pg (ref 26.0–34.0)
MCH: 31.3 pg (ref 26.0–34.0)
MCH: 30.8 pg (ref 26.0–34.0)
MCHC: 35.3 g/dL (ref 30.0–36.0)
MCHC: 34.2 g/dL (ref 30.0–36.0)
MCHC: 33.5 g/dL (ref 30.0–36.0)
MCV: 88.2 fL (ref 78.0–100.0)
MCV: 91.6 fL (ref 78.0–100.0)
MCV: 92.1 fL (ref 78.0–100.0)
MONO ABS: 1 10*3/uL (ref 0.1–1.0)
MONO ABS: 1.2 10*3/uL — AB (ref 0.1–1.0)
MONOS PCT: 10 %
MONOS PCT: 7 %
Monocytes Absolute: 1.7 10*3/uL — ABNORMAL HIGH (ref 0.1–1.0)
Monocytes Relative: 8 %
NEUTROS PCT: 74 %
Neutro Abs: 12.2 10*3/uL — ABNORMAL HIGH (ref 1.7–7.7)
Neutro Abs: 10.7 10*3/uL — ABNORMAL HIGH (ref 1.7–7.7)
Neutro Abs: 11.2 10*3/uL — ABNORMAL HIGH (ref 1.7–7.7)
Neutrophils Relative %: 75 %
Neutrophils Relative %: 76 %
PLATELETS: 75 10*3/uL — AB (ref 150–400)
PLATELETS: 64 10*3/uL — AB (ref 150–400)
Platelets: 89 10*3/uL — ABNORMAL LOW (ref 150–400)
RBC: 2.28 MIL/uL — AB (ref 3.87–5.11)
RBC: 2.14 MIL/uL — ABNORMAL LOW (ref 3.87–5.11)
RBC: 2.53 MIL/uL — ABNORMAL LOW (ref 3.87–5.11)
RDW: 17.3 % — ABNORMAL HIGH (ref 11.5–15.5)
RDW: 18.3 % — AB (ref 11.5–15.5)
RDW: 18.3 % — AB (ref 11.5–15.5)
WBC: 16.6 10*3/uL — ABNORMAL HIGH (ref 4.0–10.5)
WBC: 14.2 10*3/uL — ABNORMAL HIGH (ref 4.0–10.5)
WBC: 14.8 10*3/uL — ABNORMAL HIGH (ref 4.0–10.5)

## 2016-05-21 LAB — COMPREHENSIVE METABOLIC PANEL
ALBUMIN: 1.7 g/dL — AB (ref 3.5–5.0)
ALK PHOS: 60 U/L (ref 38–126)
ALT: 42 U/L (ref 14–54)
AST: 100 U/L — ABNORMAL HIGH (ref 15–41)
Anion gap: 15 (ref 5–15)
BUN: 129 mg/dL — ABNORMAL HIGH (ref 6–20)
CALCIUM: 8 mg/dL — AB (ref 8.9–10.3)
CHLORIDE: 96 mmol/L — AB (ref 101–111)
CO2: 20 mmol/L — AB (ref 22–32)
CREATININE: 7.73 mg/dL — AB (ref 0.44–1.00)
GFR calc Af Amer: 7 mL/min — ABNORMAL LOW (ref >60)
GFR calc non Af Amer: 6 mL/min — ABNORMAL LOW (ref >60)
GLUCOSE: 105 mg/dL — AB (ref 65–99)
Potassium: 5.8 mmol/L — ABNORMAL HIGH (ref 3.5–5.1)
SODIUM: 131 mmol/L — AB (ref 135–145)
Total Bilirubin: 2.1 mg/dL — ABNORMAL HIGH (ref 0.3–1.2)
Total Protein: 3.7 g/dL — ABNORMAL LOW (ref 6.5–8.1)

## 2016-05-21 LAB — MAGNESIUM: Magnesium: 2.4 mg/dL (ref 1.7–2.4)

## 2016-05-21 LAB — PHOSPHORUS: PHOSPHORUS: 8.8 mg/dL — AB (ref 2.5–4.6)

## 2016-05-21 LAB — PREPARE RBC (CROSSMATCH)

## 2016-05-21 LAB — LACTIC ACID, PLASMA: LACTIC ACID, VENOUS: 1.8 mmol/L (ref 0.5–1.9)

## 2016-05-21 SURGERY — EGD (ESOPHAGOGASTRODUODENOSCOPY)
Anesthesia: General

## 2016-05-21 MED ORDER — MIDAZOLAM HCL 5 MG/5ML IJ SOLN
INTRAMUSCULAR | Status: DC | PRN
Start: 2016-05-21 — End: 2016-05-21
  Administered 2016-05-21: 2 mg via INTRAVENOUS

## 2016-05-21 MED ORDER — SEVELAMER CARBONATE 800 MG PO TABS
1600.0000 mg | ORAL_TABLET | Freq: Three times a day (TID) | ORAL | Status: DC
  Administered 2016-05-21: 800 mg via ORAL
  Administered 2016-05-22 – 2016-05-27 (×15): 1600 mg via ORAL
  Filled 2016-05-21 (×20): qty 2

## 2016-05-21 MED ORDER — PANTOPRAZOLE SODIUM 40 MG PO TBEC: 40.0000 mg | DELAYED_RELEASE_TABLET | Freq: Every day | ORAL | Status: DC

## 2016-05-21 MED ORDER — SODIUM CHLORIDE 0.9 % IV SOLN: INTRAVENOUS | Status: DC

## 2016-05-21 MED ORDER — RIFAXIMIN 550 MG PO TABS
550.0000 mg | ORAL_TABLET | Freq: Two times a day (BID) | ORAL | Status: DC
  Administered 2016-05-21 – 2016-05-27 (×11): 550 mg via ORAL
  Filled 2016-05-21 (×13): qty 1

## 2016-05-21 MED ORDER — PHENYLEPHRINE HCL 10 MG/ML IJ SOLN
INTRAMUSCULAR | Status: DC | PRN
  Administered 2016-05-21 (×3): 80 ug via INTRAVENOUS

## 2016-05-21 MED ORDER — FENTANYL CITRATE (PF) 100 MCG/2ML IJ SOLN
INTRAMUSCULAR | Status: DC | PRN
  Administered 2016-05-21 (×2): 50 ug via INTRAVENOUS

## 2016-05-21 MED ORDER — FENTANYL CITRATE (PF) 100 MCG/2ML IJ SOLN
INTRAMUSCULAR | Status: AC
  Filled 2016-05-21: qty 4

## 2016-05-21 MED ORDER — MIDAZOLAM HCL 2 MG/2ML IJ SOLN
INTRAMUSCULAR | Status: AC
  Filled 2016-05-21: qty 2

## 2016-05-21 MED ORDER — LACTULOSE 10 GM/15ML PO SOLN
20.0000 g | Freq: Two times a day (BID) | ORAL | Status: DC
  Administered 2016-05-21 – 2016-05-27 (×11): 20 g via ORAL
  Filled 2016-05-21 (×12): qty 30

## 2016-05-21 MED ORDER — SUCRALFATE 1 GM/10ML PO SUSP
1.0000 g | Freq: Three times a day (TID) | ORAL | Status: DC
  Administered 2016-05-21 – 2016-05-27 (×24): 1 g via ORAL
  Filled 2016-05-21 (×26): qty 10

## 2016-05-21 MED ORDER — PROPOFOL 10 MG/ML IV BOLUS
INTRAVENOUS | Status: DC | PRN
  Administered 2016-05-21: 10 mg via INTRAVENOUS
  Administered 2016-05-21: 20 mg via INTRAVENOUS

## 2016-05-21 MED ORDER — BUTAMBEN-TETRACAINE-BENZOCAINE 2-2-14 % EX AERO
INHALATION_SPRAY | CUTANEOUS | Status: DC | PRN
  Administered 2016-05-21: 2 via TOPICAL

## 2016-05-21 MED ORDER — PROPOFOL 500 MG/50ML IV EMUL
INTRAVENOUS | Status: DC | PRN
  Administered 2016-05-21: 125 ug/kg/min via INTRAVENOUS

## 2016-05-21 MED ORDER — SODIUM CHLORIDE 0.9 % IV SOLN: Freq: Once | INTRAVENOUS | Status: AC

## 2016-05-21 MED ORDER — VANCOMYCIN HCL IN DEXTROSE 1-5 GM/200ML-% IV SOLN
1000.0000 mg | INTRAVENOUS | Status: DC
  Filled 2016-05-21: qty 200

## 2016-05-21 MED ORDER — OXYCODONE-ACETAMINOPHEN 5-325 MG PO TABS
1.0000 | ORAL_TABLET | Freq: Four times a day (QID) | ORAL | Status: DC | PRN
  Administered 2016-05-21 – 2016-05-23 (×8): 1 via ORAL
  Filled 2016-05-21 (×8): qty 1

## 2016-05-21 NOTE — Progress Notes (Signed)
S: SP EGD.  CO Back pain O:BP (!) 107/31   Pulse 89   Temp 98.3 F (36.8 C) (Oral)   Resp 16   Ht 5' (1.524 m)   Wt 72.8 kg (160 lb 7.9 oz)   LMP 09/19/2013   SpO2 98%   BMI 31.34 kg/m   Intake/Output Summary (Last 24 hours) at 05/21/16 0917 Last data filed at 05/21/16 [id number]  Gross per 24 hour  Intake             2510 ml  Output             -299 ml  Net             2809 ml   Weight change: 2.307 kg (5 lb 1.4 oz) [id number] and alert CVS:RRR 1/6 systolic M Resp:Decreased BS bases Abd:+ BS + distention from ascites, umbilical hernia Ext: 2+ edema  RUA AVF + bruit NEURO:CNI Ox3 no asterixis   . darbepoetin (ARANESP) injection - DIALYSIS  60 mcg Intravenous Q Thu-HD  . doxercalciferol  4 mcg Intravenous Q T,Th,Sa-HD  . lidocaine  10 mL Mouth/Throat Once  . mouth rinse  15 mL Mouth Rinse BID  . midodrine  10 mg Oral TID WC  . [START ON 05/23/2016] pantoprazole  40 mg Intravenous Q12H  . piperacillin-tazobactam (ZOSYN)  IV  3.375 g Intravenous Q12H  . sodium chloride flush  3 mL Intravenous Q12H   Dg Chest 2 View  Result Date: 05/19/2016 CLINICAL DATA:  Fatigue and nausea for 2-3 days EXAM: CHEST  2 VIEW COMPARISON:  04/30/2016 FINDINGS: Low volume chest with interstitial crowding and atelectasis. There is no edema, consolidation, effusion, or pneumothorax. Normal heart size and mediastinal contours for technique. IMPRESSION: Low volume chest without acute finding. Electronically Signed   By: Monte Fantasia M.D.   On: 05/19/2016 15:44   US Abdomen Complete  Result Date: 05/20/2016 CLINICAL DATA:  Cirrhosis, dialysis patient. Abdominal pain with distention EXAM: ABDOMEN ULTRASOUND COMPLETE COMPARISON:  04/29/2016, CT scan 01/02/2016 FINDINGS: Gallbladder: Shadowing stone measuring 8 mm is present within the gallbladder lumen. No sonographic Murphy's sign per sonographer. Mild wall thickening measuring 3.1 mm. Common bile duct: Diameter: 4.1 mm Liver: Heterogenous echotexture  with nodularity consistent with cirrhosis. No gross focal hepatic abnormality. IVC: No abnormality visualized. Pancreas: Visualized portion unremarkable. Spleen: Size and appearance within normal limits. Right Kidney: Length: 9.4 cm. Slight increased cortical echogenicity. No mass or hydronephrosis visualized. Left Kidney: Length: 7.7 cm. Slight increased cortical echogenicity. No mass or hydronephrosis visualized. Abdominal aorta: No aneurysm visualized. Poor visualization of the distal aorta and bifurcation. Other findings: Large volume of ascites. Tortuous vessels superior to the you right kidney and spleen, consistent with varices. IMPRESSION: 1. Gallstone. Mild nonspecific wall thickening of the gallbladder. Normal common bile duct diameter. 2. Cirrhotic appearing liver. 3. Large volume of ascites. 4. Slightly small appearing kidneys with mild increased cortical echogenicity. Electronically Signed   By: Donavan Foil M.D.   On: 05/20/2016 21:08   Dg Chest Portable 1 View  Result Date: 05/20/2016 CLINICAL DATA:  Central line placement. EXAM: PORTABLE CHEST 1 VIEW COMPARISON:  05/19/2016 FINDINGS: The left IJ central venous catheter tip is in the distal SVC near the cavoatrial junction. No complicating features. The cardiac silhouette, mediastinal and hilar contours are within normal limits and stable. Stable tortuosity and calcification of the thoracic aorta. Low lung volumes with vascular crowding and streaky bibasilar atelectasis. IMPRESSION: Left IJ central venous catheter in good position  with the tip near the cavoatrial junction. Electronically Signed   By: Marijo Sanes M.D.   On: 05/20/2016 17:26   BMET    Component Value Date/Time   NA 131 (L) 05/21/2016 0450   K 5.8 (H) 05/21/2016 0450   CL 96 (L) 05/21/2016 0450   CO2 20 (L) 05/21/2016 0450   GLUCOSE 105 (H) 05/21/2016 0450   BUN 129 (H) 05/21/2016 0450   CREATININE 7.73 (H) 05/21/2016 0450   CREATININE 1.46 (H) 06/13/2014 0959    CALCIUM 8.0 (L) 05/21/2016 0450   GFRNONAA 6 (L) 05/21/2016 0450   GFRAA 7 (L) 05/21/2016 0450   CBC    Component Value Date/Time   WBC 16.6 (H) 05/21/2016 0118   RBC 2.28 (L) 05/21/2016 0118   HGB 7.1 (L) 05/21/2016 0118   HCT 20.1 (L) 05/21/2016 0118   PLT 89 (L) 05/21/2016 0118   MCV 88.2 05/21/2016 0118   MCH 31.1 05/21/2016 0118   MCHC 35.3 05/21/2016 0118   RDW 17.3 (H) 05/21/2016 0118   LYMPHSABS 2.0 05/21/2016 0118   MONOABS 1.7 (H) 05/21/2016 0118   EOSABS 0.5 05/21/2016 0118   BASOSABS 0.2 (H) 05/21/2016 0118     Assessment:  1. ESRD  TTS  [facility] 2. Hyperkalemia 3. GI bleed with esophageal ulcer 4. NASH  Plan: 1. HD today and will try to pull fluid though BP is a limiting factor 2.  She can have a paracentesis from renal standpoint but would do on non dx day   Wanda [name] T

## 2016-05-21 NOTE — Op Note (Signed)
[facility] Patient Name: Wanda [name] Procedure Date : 05/21/2016 MRN: [id number] Attending MD: Gatha Mayer , MD Date of Birth: 07-25-1976 CSN: [id number] Age: 40 Admit Type: Inpatient Procedure:                Upper GI endoscopy Indications:              Melena Providers:                Gatha Mayer, MD, Zenon Mayo, RN, Cherylynn Ridges, Technician, Gershon Crane, CRNA Referring MD:              Medicines:                Monitored Anesthesia Care Complications:            No immediate complications. Estimated Blood Loss:     Estimated blood loss: none. Procedure:                Pre-Anesthesia Assessment:                           - Prior to the procedure, a History and Physical                            was performed, and patient medications and                            allergies were reviewed. The patient's tolerance of                            previous anesthesia was also reviewed. The risks                            and benefits of the procedure and the sedation                            options and risks were discussed with the patient.                            All questions were answered, and informed consent                            was obtained. Prior Anticoagulants: The patient has                            taken no previous anticoagulant or antiplatelet                            agents. ASA Grade Assessment: IV - A patient with                            severe systemic disease that is a constant threat  to life. After reviewing the risks and benefits,                            the patient was deemed in satisfactory condition to                            undergo the procedure.                           After obtaining informed consent, the endoscope was                            passed under direct vision. Throughout the                            procedure, the patient's blood  pressure, pulse, and                            oxygen saturations were monitored continuously. The                            [id number]) scope was introduced through the                            mouth, and advanced to the second part of duodenum.                            The upper GI endoscopy was accomplished without                            difficulty. The patient tolerated the procedure                            well. Scope In: Scope Out: Findings:      One esophageal ulcer with no bleeding and stigmata of recent bleeding       was found in the distal esophagus. The lesion was 10 mm in largest       dimension.      The exam was otherwise without abnormality.      The cardia and gastric fundus were normal on retroflexion. Impression:               - Non-bleeding esophageal ulcer.                           - The examination was otherwise normal.                           - No specimens collected.                           -                           PRESUMED PERSISTENT ULCER FROM MALLORY WEISS TEAR -  SOME FLAT PIGMENTED SPOTS SEEN. ? IF SLOW TO HEAL                            DUE TO ASCITES PROMOTING REFLUX, ? COMPLIANCE W/ TX Moderate Sedation:      Please see anesthesia notes, moderate sedation not given Recommendation:           - Return patient to ICU for ongoing care.                           - Renal with fluid restriction.                           - Continue present medications.                           - Use sucralfate suspension 1 gram PO QID.                           -                           ADD SUCRALFATE                           STAY ON IV PPI TODAY AND SWITCH TO PO TOMORROW                           PARACENTESIS WHEN RENAL THINKS OK - TAKE OFF AS                            MUCH FLUID AS POSSIBLE                           DC OCTREOTIDE - I DID NOT SEE ANY VARICES AND I DID                            NOT APPRECIATE PORTAL  GASTROPATHY TODAY Procedure Code(s):        --- Professional ---                           [phone], Esophagogastroduodenoscopy, flexible,                            transoral; diagnostic, including collection of                            specimen(s) by brushing or washing, when performed                            (separate procedure) Diagnosis Code(s):        --- Professional ---                           K22.10, Ulcer of esophagus without bleeding  K92.1, Melena (includes Hematochezia) CPT copyright 2016 [employer] Association. All rights reserved. The codes documented in this report are preliminary and upon coder review may  be revised to meet current compliance requirements. Gatha Mayer, MD 05/21/2016 8:30:04 AM This report has been signed electronically. Number of Addenda: 0

## 2016-05-21 NOTE — Transfer of Care (Signed)
Immediate Anesthesia Transfer of Care Note  Patient: Wanda [name]  Procedure(s) Performed: Procedure(s): ESOPHAGOGASTRODUODENOSCOPY (EGD) (N/A)  Patient Location: PACU  Anesthesia Type:MAC  Level of Consciousness: awake, alert , oriented and patient cooperative  Airway & Oxygen Therapy: Patient Spontanous Breathing and Patient connected to nasal cannula oxygen  Post-op Assessment: Report given to RN and Post -op Vital signs reviewed and stable  Post vital signs: Reviewed and stable  Last Vitals:  Vitals:   05/21/16 0746 05/21/16 0830  BP: (!) 116/33 (!) 106/28  Pulse:  84  Resp: 13 18  Temp: 36.7 C 36.8 C    Last Pain:  Vitals:   05/21/16 0830  TempSrc: Oral  PainSc: 7       Patients Stated Pain Goal: 3 ([id number])  Complications: No apparent anesthesia complications

## 2016-05-21 NOTE — Progress Notes (Signed)
PULMONARY / CRITICAL CARE MEDICINE   Name: Wanda [name] MRN: [id number] DOB: 1976/06/21    ADMISSION DATE:  05/19/2016   CHIEF COMPLAINT:  shock  HISTORY OF PRESENT ILLNESS:   40 year old female with complex past medical history as below, which is significant for NASH cirrhosis with recent UGIB and Mallory-Weiss tear 04/2016, Esophageal Varices, ESRD on HD(TTF), chronic hypotension, SBP, Thrombocytopenia, and Bipolar disorder. She was admitted 9/20 with melena and coffee ground emesis. She had skipped several dialysis treatments and became nauseated and had dry heaves/wretching. Anemia with hemoglobin to 6.4 requiring 2 units PRBC. EGD the following day demonstrated Mallory-Weiss tear with adherent clot, and varices that remain obliterated from prior treatments. Conservative management was recommended. Bleeding had improved labs normalized so she was discharged to home 9/23 after therapeutic paracentesis.  10/10 she presented to [facility] ED with complaints of weakness and nausea x 2 days. Also complaining of some dark stools. She was hypotensive in ED and iSTAT lactic acid 5.3 so code sepsis was initiated and NS bolus 20ml/kg ordered, but stopped because hemoglobin resulted 3.9. Antibiotics were ordered for infectious concern. 2 units PRBC also ordered. INR WNL. Due to critical illness and likely need for urgent endoscopy, Mrs. Magbanua was transported to [facility] for ICU admission.   PAST MEDICAL HISTORY :  She  has a past medical history of Acute blood loss anemia (02/25/2014); Acute renal failure ([place]) (09/2013); Anasarca (10/10/2013); Anxiety; Bipolar disorder ([place]) (12/04/2013); Bleeding esophageal varices ([facility]) (02/28/2014); C. difficile colitis (04/19/2014); Chronic hypotension; Cirrhosis ([place]) (10/05/13); Cirrhosis of liver with ascites ([place]); Depression; ESRD (end stage renal disease) on dialysis ([place]) (08/2014); Folate deficiency (09/2013); Gastroesophageal junction ulcer (09/17/2014); GERD  (gastroesophageal reflux disease); Hematemesis/vomiting blood (02/24/2014); Macrocytosis (02/28/2014); PNA (pneumonia) (10/13/2013); SBP (spontaneous bacterial peritonitis) ([place]) (11/10/2013); and Thrombocytopenia ([place]).  PAST SURGICAL HISTORY: She  has a past surgical history that includes None; Paracentesis (Feb 2015); Esophagogastroduodenoscopy (N/A, 11/14/2013); Paracentesis (10/2013); Colonoscopy (N/A, 12/19/2013); Esophagogastroduodenoscopy (N/A, 02/11/2014); Esophagogastroduodenoscopy (N/A, 07/04/2014); esophageal banding (07/04/2014); Esophagogastroduodenoscopy (egd) with propofol (N/A, 07/24/2014); esophageal banding (N/A, 07/24/2014); Central venous catheter insertion (Right); Esophagogastroduodenoscopy (N/A, 09/16/2014); Esophagogastroduodenoscopy (egd) with propofol (N/A, 10/26/2014); AV fistula placement (Right, 11/16/2014); Esophagogastroduodenoscopy (N/A, 12/14/2014); Esophagogastroduodenoscopy (N/A, 03/27/2015); Esophagogastroduodenoscopy (N/A, 06/05/2015); Esophagogastroduodenoscopy (N/A, 07/23/2015); esophageal banding (N/A, 07/23/2015); Esophagogastroduodenoscopy (N/A, 03/04/2016); esophageal banding (N/A, 03/04/2016); Esophagogastroduodenoscopy (egd) with propofol (N/A, 04/30/2016); and esophageal banding (N/A, 04/30/2016).  Allergies  Allergen Reactions  . Lasix [Furosemide] Other (See Comments)    "doesn't work"  . Latex Itching  . Morphine And Related Hives and Itching    No current facility-administered medications on file prior to encounter.    Current Outpatient Prescriptions on File Prior to Encounter  Medication Sig  . clotrimazole (LOTRIMIN) 1 % cream APPLY TOPICALLY THREE TIMES DAILY FOR 10 DAYS  . Darbepoetin Alfa (ARANESP) 60 MCG/0.3ML SOSY injection Inject 0.3 mLs (60 mcg total) into the vein every Thursday with hemodialysis. (Patient taking differently: Inject 60 mcg into the vein every Tuesday with hemodialysis. )  . lactulose (CHRONULAC) 10 GM/15ML solution TAKE 3 TEASPOONSFUL (15ML)  BY MOUTH TWICE DAILY (Patient taking differently: Take 10gm by mouth twice daily as needed for constipation)  . lidocaine (XYLOCAINE) 2 % solution TAKE TWO TEASPOONSFUL (10ML) BY MOUTH BEFORE MEALS AND AT BEDTIME TO PREVENT CHEST PAIN WHILE EATING  . lidocaine-prilocaine (EMLA) cream Apply 1 application topically as needed (for dialysis treatments).  . LORazepam (ATIVAN) 1 MG tablet TAKE 1/2 TO 1 TABLET BY MOUTH TWICE DAILY AS  NEEDED FOR BACK SPASM OR FOR ANXIETY (Patient taking differently: Take 0.5-1 mg by mouth 2 (two) times daily as needed for anxiety (or back spasms). )  . midodrine (PROAMATINE) 10 MG tablet Take 1 tablet (10 mg total) by mouth daily. *Takes only on dialysis days on Tuesdays, Thursdays, and Saturdays (Sometimes takes treatments on Friday instead of Saturday)**Medication to keep your blood pressure from falling. (Patient taking differently: Take 10 mg by mouth See admin instructions. Take 10 mg by mouth on dialysis days on Tuesdays, Thursdays, and Saturdays (Sometimes takes treatments on Friday instead of Saturday))  . Oxycodone HCl 10 MG TABS 1 PO TID AS NEEDED FOR PAIN (Patient taking differently: Take 10 mg by mouth 3 (three) times daily as needed. For pain)  . RENVELA 800 MG tablet Take 1600 mg by mouth 3 times daily with meals. Take 800 mg by mouth with snacks.  . pantoprazole (PROTONIX) 40 MG tablet Take 1 tablet (40 mg total) by mouth 2 (two) times daily. (Patient not taking: Reported on 05/20/2016)  . promethazine (PHENERGAN) 12.5 MG tablet TAKE ONE TABLET BY MOUTH EVERY 6 HOURS AS NEEDED FOR NAUSEA AND VOMITING. (Patient not taking: Reported on 05/20/2016)  . rifaximin (XIFAXAN) 550 MG TABS tablet Take 1 tablet (550 mg total) by mouth 2 (two) times daily before a meal. (Patient not taking: Reported on 05/20/2016)    FAMILY HISTORY:  Her indicated that her mother is deceased. She indicated that her father is deceased. She indicated that her sister is alive. She indicated  that her brother is alive. She indicated that the status of her neg hx is unknown.    SOCIAL HISTORY: She  reports that she quit smoking about 7 years ago. Her smoking use included Cigarettes. She has a 5.00 pack-year smoking history. She has never used smokeless tobacco. She reports that she does not drink alcohol or use drugs.  REVIEW OF SYSTEMS:   Reports chest discomfort but better than earlier when she presented; this is a sharp pain that starts in her left breast and comes to center of chest. No SOB, no current nausea. Also reports of abdominal and back pain.   SUBJECTIVE:  No events overnight. Has EGD this AM. Doing okay. Reports of back pain. Abdomen feels full. Denies nausea.   VITAL SIGNS: BP (!) 103/38   Pulse 89   Temp 97.9 F (36.6 C) (Oral)   Resp 13   Ht 5' (1.524 m)   Wt 72.8 kg (160 lb 7.9 oz)   LMP 09/19/2013   SpO2 100%   BMI 31.34 kg/m   HEMODYNAMICS: CVP:  [4 mmHg-6 mmHg] 4 mmHg  VENTILATOR SETTINGS:    INTAKE / OUTPUT: I/O last 3 completed shifts: In: 4641.2 [I.V.:2421.8; Blood:1399.5; Other:70; IV Piggyback:750] Out: -300   PHYSICAL EXAMINATION: GEN: seems in some discomfort  HEENT: sclera clear, PERRL CV: RRR,systolic murmur noted  PULM: CTAB, normal effort ABD: distended, abdominal hernia noted in left lower quadrant of abdomen. Diffuse "fullness" of abdomen per patient on palpation, + BS SKIN: No rash or cyanosis; warm and well-perfused EXTR: pitting edema of LE; able to move all extremities  NEURO: Awake, alert, no focal deficits grossly, normal speech   LABS:  BMET  Recent Labs Lab 05/20/16 0438 05/20/16 1154 05/20/16 1630 05/21/16 0450  NA 128* 133* 133* 131*  K >7.5* 2.9* 4.8 5.8*  CL 96* 92* 99* 96*  CO2 14*  --  20* 20*  BUN 195* 88* 107* 129*  CREATININE 10.52*  4.90* 6.77* 7.73*  GLUCOSE 116* 99 130* 105*    Electrolytes  Recent Labs Lab 05/19/16 1400  05/20/16 0438 05/20/16 1630 05/21/16 0450  CALCIUM  --   <  > 8.5* 8.1* 8.0*  MG 3.1*  --   --  2.2 2.4  PHOS  --   --   --  7.2* 8.8*  < > = values in this interval not displayed.  CBC  Recent Labs Lab 05/20/16 1050 05/20/16 1154 05/20/16 1630 05/21/16 0118  WBC 18.5*  --  21.4* 16.6*  HGB 7.0* 9.5* 8.5* 7.1*  HCT 20.5* 28.0* 24.4* 20.1*  PLT 145*  --  105* 89*    Coag's  Recent Labs Lab 05/19/16 1510  INR 1.36    Sepsis Markers  Recent Labs Lab 05/19/16 1814 05/20/16 1018 05/20/16 1630  LATICACIDVEN 5.98* 1.2 2.8*    ABG No results for input(s): PHART, PCO2ART, PO2ART in the last 168 hours.  Liver Enzymes  Recent Labs Lab 05/19/16 1510 05/20/16 1630 05/21/16 0450  AST 65*  --  100*  ALT 32  --  42  ALKPHOS 93  --  60  BILITOT 1.2  --  2.1*  ALBUMIN 2.0* 1.7* 1.7*    Cardiac Enzymes  Recent Labs Lab 05/19/16 2130 05/20/16 0438 05/20/16 1030  TROPONINI <0.03 <0.03 <0.03    Glucose  Recent Labs Lab 05/19/16 1518 05/19/16 1646 05/19/16 2031  GLUCAP 105* 106* 117*    Imaging US Abdomen Complete  Result Date: 05/20/2016 CLINICAL DATA:  Cirrhosis, dialysis patient. Abdominal pain with distention EXAM: ABDOMEN ULTRASOUND COMPLETE COMPARISON:  04/29/2016, CT scan 01/02/2016 FINDINGS: Gallbladder: Shadowing stone measuring 8 mm is present within the gallbladder lumen. No sonographic Murphy's sign per sonographer. Mild wall thickening measuring 3.1 mm. Common bile duct: Diameter: 4.1 mm Liver: Heterogenous echotexture with nodularity consistent with cirrhosis. No gross focal hepatic abnormality. IVC: No abnormality visualized. Pancreas: Visualized portion unremarkable. Spleen: Size and appearance within normal limits. Right Kidney: Length: 9.4 cm. Slight increased cortical echogenicity. No mass or hydronephrosis visualized. Left Kidney: Length: 7.7 cm. Slight increased cortical echogenicity. No mass or hydronephrosis visualized. Abdominal aorta: No aneurysm visualized. Poor visualization of the distal aorta  and bifurcation. Other findings: Large volume of ascites. Tortuous vessels superior to the you right kidney and spleen, consistent with varices. IMPRESSION: 1. Gallstone. Mild nonspecific wall thickening of the gallbladder. Normal common bile duct diameter. 2. Cirrhotic appearing liver. 3. Large volume of ascites. 4. Slightly small appearing kidneys with mild increased cortical echogenicity. Electronically Signed   By: Donavan Foil M.D.   On: 05/20/2016 21:08   Dg Chest Portable 1 View  Result Date: 05/20/2016 CLINICAL DATA:  Central line placement. EXAM: PORTABLE CHEST 1 VIEW COMPARISON:  05/19/2016 FINDINGS: The left IJ central venous catheter tip is in the distal SVC near the cavoatrial junction. No complicating features. The cardiac silhouette, mediastinal and hilar contours are within normal limits and stable. Stable tortuosity and calcification of the thoracic aorta. Low lung volumes with vascular crowding and streaky bibasilar atelectasis. IMPRESSION: Left IJ central venous catheter in good position with the tip near the cavoatrial junction. Electronically Signed   By: Marijo Sanes M.D.   On: 05/20/2016 17:26     STUDIES:  EGD 9/21 > Mallory-Weiss tear without active bleeding currently. Esophageal mucosa scars present ?varices pretty well obliterated. Hiatal hernia. Congested portal gastropathy CXR 10/10: low lung volume, no acute finding  US Abdomen 10/11: gall stone without obstruction, normal common bile  duct diameter, cirrhotic appearing liver, large volume ascites, small appearing kidneys with increased cortical echogenicity.  EGD 10/11: non-bleeding esophageal ulcer. Presumed persistent ulcer from mallory weiss.   CULTURES: BCx2 10/10 > NGx 2 days  ANTIBIOTICS: Zosyn 10/10  In ED; 10/11 > 10/12 Vancomycin 10/10 in ED; 10/11>> Ceftaz 10/10  SIGNIFICANT EVENTS: 9/20-9/23 admit to [facility] for mallory weiss tear, clotted on EGD and managed conservatively.  10/10 admit with severe  ABLA  LINES/TUBES: AV Fistula R upper Arm PIV x 1 Left IJ 10/11 >>   ASSESSMENT / PLAN:  40 year old female with PMH of NASH cirrhosis with recent UGIB and Mallory-Weiss tear 04/2016, ESRD on HD(TTF), chronic hypotension, SBP, and Bipolar disorder. She presented on 10/10 with weakness and nausea x 2 days, dark stools and was found to be hypotensive, lactic acidosis, and significant anemia thought to be due to hemorrhageic shock likely due to recurrent GI bleed.   ASSESSMENT / PLAN:  PULMONARY A: No acute issues  P:   Supplemental O2 in setting profound anemia.   CARDIOVASCULAR A:  Hemorrhagic shock > improved with volume and minimal blood products. S/p 5 units.   Chest Pain, Intermittent: EKG without signs of ischemia, troponin negative x 3.  Hypotension: chronic but worse during admission. Improved this AM   P:  MAP goal > 69mmHg Midrodrine 10mg  TID  See GI   RENAL A:   ESRD on HD (TTF): missed two sessions of HD as outpatient prior to admission Hyperkalemia: 5.8 from 4.8 Hyponatremia, improved and seems back at baseline  Hypochloremia Hyperphoshatemia High AG acidosis - resolved  Lactic Acidosis- 5.98 >1.2 >2.8  P:   Nephrology Consulted, appreciate recommendations  Lactic Acid today   GASTROINTESTINAL A:   Gastrointestinal bleeding with recent history of Mallory-Weiss tear: non-bleeding esophageal ulcer on EGD on 10/11 NASH cirrhosis Protein calorie malnutrition Abdominal Ascites: large volume per ultrasound  Transaminitis: mild, US abdomen with no obstructing stone but with cirrhosis. Possibly due to hypoperfusion  P:   Start renal diet with fluid restriction  Protonix infusion today, then switch to PO  DC Octreotide infusion per GI GI consulted, appreciate recommendations:  sucralfate suspension 1 gram PO QID, stay on IV PPI today and switch to PO tomorrow; DC octreotide.  Restart Lactulose and Rifaximin  Consider paracentesis to evaluate for  SBP; will hold off for now   HEMATOLOGIC A:   Acute blood loss anemia: s/p 5 units pRBC. Hemoglobin still downtrending ans is at 7.1 this AM. Had 6 stools over past 24 hours (dark stools) Anemia of chronic illness (Hgb 10 at time of recent discharge) Thrombocytopenia: 89 from 105. Thought to be due to comsumption  P:  Follow CBC q 12 hours Transfuse if hemoglobin < 7 or actively bleeding   INFECTIOUS A:   SIRS, no clear infectious source, must consider SBP  P:   DC vanc Continue Zosyn   Cultures  ENDOCRINE A:   No acute issues    P:   Monitor glucose on chemistries  NEUROLOGIC A:   Acute metabolic encephalopathy, likely hepatic.: Resolved, Ammonia level within normal limits.  Chronic Pain  P:   RASS goal: 0 Monitor closely DC Fentanyl Restart home Percocet at reduced dose PRN   FAMILY  - Family  - Inter-disciplinary family meet or Palliative Care meeting due by:  10/17  Smiley Houseman, MD PGY 2 Family Medicine   05/21/2016, 7:03 AM

## 2016-05-21 NOTE — Progress Notes (Signed)
Patient having MAPs below goal of 60 over last couple of hours. Holding pain medication with low pressures. Will continue to monitor and hold off on pressors. Patient alert and conversant. Extremities warm. Suspect chronically runs low.   Olene Floss, MD [place], PGY-2

## 2016-05-21 NOTE — Progress Notes (Addendum)
PULMONARY / CRITICAL CARE MEDICINE   Name: Wanda [name] MRN: [id number] DOB: 10-27-1975    ADMISSION DATE:  05/19/2016 CONSULTATION DATE:  05/19/2016  REFERRING MD:  Dr. Rolan [facility]  CHIEF COMPLAINT:  GIB  HISTORY OF PRESENT ILLNESS:   40 year old female with complex past medical history as below, which is significant for NASH cirrhosis with recent UGIB and Mallory-Weiss tear 04/2016, ESRD on HD(TTF), chronic hypotension, SBP, and Bipolar disorder. She was admitted 9/20 with melena and coffee ground emesis. She had skipped several dialysis treatments and became nauseated and had dry heaves/wretching. Anemia with hemoglobin to 6.4 requiring 2 units PRBC. EGD the following day demonstrated Mallory-Weiss tear with adherent clot, and varices that remain obliterated from prior treatments. Conservative management was recommended. Bleeding had improved labs normalized so she was discharged to home 9/23 after therapeutic paracentesis.  10/10 she presented to [facility] ED with complaints of weakness and nausea x 2 days. Also complaining of some dark stools. She was hypotensive in ED and iSTAT lactic acid 5.3 so code sepsis was initiated and NS bolus 80ml/kg ordered, but stopped because hemoglobin resulted 3.9. Antibiotics were ordered for infectious concern. 2 units PRBC also ordered. INR WNL. Due to critical illness and likely need for urgent endoscopy, Mrs. Cash will be transported to [facility] for ICU admission.   SUBJECTIVE: No acute events overnight. Patient underwent endoscopy this morning. No reported hematochezia overnight but was having melena. Denies any abdominal pain or recent nausea. No chest pain or pressure.   REVIEW OF SYSTEMS:  No dyspnea or cough. No fever or chills. Is complaining of back pain.   VITAL SIGNS: BP (!) 107/31   Pulse 86   Temp 98.3 F (36.8 C) (Oral)   Resp 17   Ht 5' (1.524 m)   Wt 160 lb 7.9 oz (72.8 kg)   LMP 09/19/2013   SpO2 99%   BMI 31.34  kg/m   HEMODYNAMICS: CVP:  [4 mmHg-6 mmHg] 4 mmHg  VENTILATOR SETTINGS:    INTAKE / OUTPUT: I/O last 3 completed shifts: In: 4691.2 [I.V.:2471.8; Blood:1399.5; Other:70; IV Piggyback:750] Out: -300   PHYSICAL EXAMINATION: General:  Female. No distress. Fiance at bedside.  Neuro:  Alert and oriented x 4. Grossly nonfocal. Following commands. HEENT:  Moist mucus membranes. No scleral injection.  Cardiovascular:  Regular rhythm. Bilateral lower extremity edema. No appreciable JVD. Lungs:  Slightly diminished breath sounds in the bases. Normal work of breathing on nasal cannula oxygen.  Abdomen:  Soft. Protuberant. Nontender. Musculoskeletal:  No joint effusion or deformity. Skin:  Warm & dry. No rash on exposed skin.   LABS:  BMET  Recent Labs Lab 05/20/16 0438 05/20/16 1154 05/20/16 1630 05/21/16 0450  NA 128* 133* 133* 131*  K >7.5* 2.9* 4.8 5.8*  CL 96* 92* 99* 96*  CO2 14*  --  20* 20*  BUN 195* 88* 107* 129*  CREATININE 10.52* 4.90* 6.77* 7.73*  GLUCOSE 116* 99 130* 105*    Electrolytes  Recent Labs Lab 05/19/16 1400  05/20/16 0438 05/20/16 1630 05/21/16 0450  CALCIUM  --   < > 8.5* 8.1* 8.0*  MG 3.1*  --   --  2.2 2.4  PHOS  --   --   --  7.2* 8.8*  < > = values in this interval not displayed.  CBC  Recent Labs Lab 05/20/16 1050 05/20/16 1154 05/20/16 1630 05/21/16 0118  WBC 18.5*  --  21.4* 16.6*  HGB 7.0*  9.5* 8.5* 7.1*  HCT 20.5* 28.0* 24.4* 20.1*  PLT 145*  --  105* 89*    Coag's  Recent Labs Lab 05/19/16 1510  INR 1.36    Sepsis Markers  Recent Labs Lab 05/19/16 1814 05/20/16 1018 05/20/16 1630  LATICACIDVEN 5.98* 1.2 2.8*    ABG No results for input(s): PHART, PCO2ART, PO2ART in the last 168 hours.  Liver Enzymes  Recent Labs Lab 05/19/16 1510 05/20/16 1630 05/21/16 0450  AST 65*  --  100*  ALT 32  --  42  ALKPHOS 93  --  60  BILITOT 1.2  --  2.1*  ALBUMIN 2.0* 1.7* 1.7*    Cardiac Enzymes  Recent  Labs Lab 05/19/16 2130 05/20/16 0438 05/20/16 1030  TROPONINI <0.03 <0.03 <0.03    Glucose  Recent Labs Lab 05/19/16 1518 05/19/16 1646 05/19/16 2031  GLUCAP 105* 106* 117*    Imaging US Abdomen Complete  Result Date: 05/20/2016 CLINICAL DATA:  Cirrhosis, dialysis patient. Abdominal pain with distention EXAM: ABDOMEN ULTRASOUND COMPLETE COMPARISON:  04/29/2016, CT scan 01/02/2016 FINDINGS: Gallbladder: Shadowing stone measuring 8 mm is present within the gallbladder lumen. No sonographic Murphy's sign per sonographer. Mild wall thickening measuring 3.1 mm. Common bile duct: Diameter: 4.1 mm Liver: Heterogenous echotexture with nodularity consistent with cirrhosis. No gross focal hepatic abnormality. IVC: No abnormality visualized. Pancreas: Visualized portion unremarkable. Spleen: Size and appearance within normal limits. Right Kidney: Length: 9.4 cm. Slight increased cortical echogenicity. No mass or hydronephrosis visualized. Left Kidney: Length: 7.7 cm. Slight increased cortical echogenicity. No mass or hydronephrosis visualized. Abdominal aorta: No aneurysm visualized. Poor visualization of the distal aorta and bifurcation. Other findings: Large volume of ascites. Tortuous vessels superior to the you right kidney and spleen, consistent with varices. IMPRESSION: 1. Gallstone. Mild nonspecific wall thickening of the gallbladder. Normal common bile duct diameter. 2. Cirrhotic appearing liver. 3. Large volume of ascites. 4. Slightly small appearing kidneys with mild increased cortical echogenicity. Electronically Signed   By: Donavan Foil M.D.   On: 05/20/2016 21:08   Dg Chest Portable 1 View  Result Date: 05/20/2016 CLINICAL DATA:  Central line placement. EXAM: PORTABLE CHEST 1 VIEW COMPARISON:  05/19/2016 FINDINGS: The left IJ central venous catheter tip is in the distal SVC near the cavoatrial junction. No complicating features. The cardiac silhouette, mediastinal and hilar contours  are within normal limits and stable. Stable tortuosity and calcification of the thoracic aorta. Low lung volumes with vascular crowding and streaky bibasilar atelectasis. IMPRESSION: Left IJ central venous catheter in good position with the tip near the cavoatrial junction. Electronically Signed   By: Marijo Sanes M.D.   On: 05/20/2016 17:26     STUDIES:  EGD 9/21: Mallory-Weiss tear without active bleeding currently. Esophageal mucosa scars present ?varices pretty well obliterated. Hiatal hernia. Congested portal gastropathy Abd U/S 10/11:  Gallstone. Mild nonspecific wall thickening of the gallbladder. Normal common bile duct diameter. Large volume of ascites. Slightly small appearing kidneys with mild increased cortical echogenicity. EGD:  Esophageal ulcer. No varices.   MICROBIOLOGY: MRSA PCR 10/10:  Negative  Blood Ctx x2 10/10 >>  ANTIBIOTICS: Ceftaz 10/10 - 10/11 Vancomycin 10/10 - 10/12 Zosyn 10/10>>  SIGNIFICANT EVENTS: 9/20-9/23 admit to [facility] for mallory weiss tear, clotted on EGD and managed conservatively.  10/10 - admit with severe anemia w/ GI Bleeding   LINES/TUBES: L IJ CVL 10/11 >> PIV x1  ASSESSMENT / PLAN:  GASTROINTESTINAL A:   Gastrointestinal Bleed - Recent Mallory-Weiss tear. Esophageal ulcer  on EGD 10/12. H/O NASH Cirrhosis Protein-Calorie Malnutrition H/O SBP H/O Varices w/ banding  P:   Starting Renal Diet GI Following & appreciate assistance D/C Octreotide gtt Protonix gtt & transition to PO tomorrow Starting Carafate Restart home Lactulose & Xifaxan  Likely will need paracentesis in a couple of days  HEMATOLOGIC A:   Anemia - Secondary to acute blood loss. S/P 5u PRBC last in AM on HD 10/11. H/O Anemia of Chronic Illness Leukocytosis - Multifactorial. Improving. Thrombocytopenia - Worsening. Likely previously hemoconcentration. Returning to baseline.  P:  S/P DDAVP Trending Hgb/Hct q12hr Trending cell counts  daily SCDs  PULMONARY A: Acute Hypoxic Respiratory Failure - Likely secondary to volume.  P:   Weaning FiO2 for Sat >92% Continuous pulse oximetry  CARDIOVASCULAR A:  Shock/Borderline Hypotension - Hemorrhagic. Resolved with volume and blood products.  P:  Continuous telemetry monitoring Vitals per unit protocol MAP goal > 84mmHg Midodrine TID WC  RENAL A:   ESRD on HD (TTF) Hyperkalemia - Profound. Improving. Hyponatremia - Mild. Stable. Hypochloremia - Mild. Stable. Metabolic Acidosis - Improving. Lactic Acidosis - Improving.  P:   Nephrology Following HD per Nephrology Monitoring electrolytes closely Replacing electrolytes as indicated Trending Lactic Acid intermittently Restart Renvela tid WC  INFECTIOUS A:   SIRS - No clear source of infection. H/O SBP  P:   Continuing Empiric Zosyn Day #3 D/C Vancomycin  Awaiting Cultures Trending Procalcitonin per Algorithm   ENDOCRINE A:   No acute issues    P:   Monitor glucose on chemistries  NEUROLOGIC A:   Acute Encephalopathy - Multfactorial. Improving. Likely due to anemia & hypotension. NH4 34.   P:   RASS goal: 0 Monitor closely Restart home Oxycodone at reduced dose for pain D/C Fentanyl   FAMILY  - Updates: Patient and Fiance updated at bedside 10/12 by Dr. Ashok Cordia.   - Inter-disciplinary family meet or Palliative Care meeting due by:  10/17  TODAY'S SUMMARY:  40 y.o. female with NASH cirrhosis and ESRD. History GIB with recent mallory weiss tear. Now presenting with hemorrhagic shock likely due to recurrent GI bleeding. Esophageal ulcer found on EGD 10/12. Restarting diet. Discontinuing Octreotide drip but continuing Protonix per GI recommendations. Restarting home GI regimen. Plan to transfuse for active bleeding or Hgb <7.0.  I have spent a total of 34 minutes of critical care time today caring for the patient, contacting Nephrology to consult, and reviewing the patient's electronic medical  record.  Sonia Baller Ashok Cordia, M.D. [facility] Pulmonary & Critical Care Pager:  [phone] After 3pm or if no response, call [phone] 05/21/2016 10:12 AM

## 2016-05-21 NOTE — Anesthesia Postprocedure Evaluation (Signed)
Anesthesia Post Note  Patient: Wanda [name]  Procedure(s) Performed: Procedure(s) (LRB): ESOPHAGOGASTRODUODENOSCOPY (EGD) (N/A)  Patient location during evaluation: Endoscopy Anesthesia Type: MAC Level of consciousness: awake and alert Pain management: pain level controlled Vital Signs Assessment: post-procedure vital signs reviewed and stable Respiratory status: spontaneous breathing, nonlabored ventilation, respiratory function stable and patient connected to nasal cannula oxygen Cardiovascular status: stable and blood pressure returned to baseline Anesthetic complications: no    Last Vitals:  Vitals:   05/21/16 1600 05/21/16 1615  BP: (!) 84/33 (!) 88/43  Pulse: (!) 101 97  Resp: 18 18  Temp: 36.8 C     Last Pain:  Vitals:   05/21/16 1600  TempSrc: Oral  PainSc:                  Catalina Gravel

## 2016-05-22 ENCOUNTER — Encounter (HOSPITAL_COMMUNITY): Payer: Self-pay | Admitting: Internal Medicine

## 2016-05-22 DIAGNOSIS — I9589 Other hypotension: Secondary | ICD-10-CM

## 2016-05-22 LAB — CBC WITH DIFFERENTIAL/PLATELET
BASOS ABS: 0.1 10*3/uL (ref 0.0–0.1)
BASOS ABS: 0.1 10*3/uL (ref 0.0–0.1)
BASOS PCT: 0 %
Basophils Relative: 1 %
EOS PCT: 9 %
EOS PCT: 9 %
Eosinophils Absolute: 1.2 10*3/uL — ABNORMAL HIGH (ref 0.0–0.7)
Eosinophils Absolute: 1.4 10*3/uL — ABNORMAL HIGH (ref 0.0–0.7)
HCT: 24.3 % — ABNORMAL LOW (ref 36.0–46.0)
HCT: 26.1 % — ABNORMAL LOW (ref 36.0–46.0)
Hemoglobin: 8.1 g/dL — ABNORMAL LOW (ref 12.0–15.0)
Hemoglobin: 8.4 g/dL — ABNORMAL LOW (ref 12.0–15.0)
LYMPHS ABS: 1.9 10*3/uL (ref 0.7–4.0)
LYMPHS PCT: 13 %
Lymphocytes Relative: 11 %
Lymphs Abs: 1.6 10*3/uL (ref 0.7–4.0)
MCH: 31 pg (ref 26.0–34.0)
MCH: 30.5 pg (ref 26.0–34.0)
MCHC: 33.3 g/dL (ref 30.0–36.0)
MCHC: 32.2 g/dL (ref 30.0–36.0)
MCV: 93.1 fL (ref 78.0–100.0)
MCV: 94.9 fL (ref 78.0–100.0)
MONO ABS: 1.1 10*3/uL — AB (ref 0.1–1.0)
MONO ABS: 1.5 10*3/uL — AB (ref 0.1–1.0)
Monocytes Relative: 8 %
Monocytes Relative: 10 %
Neutro Abs: 10.2 10*3/uL — ABNORMAL HIGH (ref 1.7–7.7)
Neutro Abs: 10.9 10*3/uL — ABNORMAL HIGH (ref 1.7–7.7)
Neutrophils Relative %: 71 %
Neutrophils Relative %: 71 %
PLATELETS: 67 10*3/uL — AB (ref 150–400)
PLATELETS: 80 10*3/uL — AB (ref 150–400)
RBC: 2.61 MIL/uL — ABNORMAL LOW (ref 3.87–5.11)
RBC: 2.75 MIL/uL — ABNORMAL LOW (ref 3.87–5.11)
RDW: 19.7 % — AB (ref 11.5–15.5)
RDW: 20.4 % — AB (ref 11.5–15.5)
WBC: 14.4 10*3/uL — ABNORMAL HIGH (ref 4.0–10.5)
WBC: 15.4 10*3/uL — ABNORMAL HIGH (ref 4.0–10.5)

## 2016-05-22 LAB — RENAL FUNCTION PANEL
ALBUMIN: 1.7 g/dL — AB (ref 3.5–5.0)
Anion gap: 12 (ref 5–15)
BUN: 68 mg/dL — AB (ref 6–20)
CO2: 23 mmol/L (ref 22–32)
CREATININE: 5.48 mg/dL — AB (ref 0.44–1.00)
Calcium: 8.1 mg/dL — ABNORMAL LOW (ref 8.9–10.3)
Chloride: 99 mmol/L — ABNORMAL LOW (ref 101–111)
GFR calc Af Amer: 10 mL/min — ABNORMAL LOW (ref >60)
GFR calc non Af Amer: 9 mL/min — ABNORMAL LOW (ref >60)
GLUCOSE: 98 mg/dL (ref 65–99)
PHOSPHORUS: 6.8 mg/dL — AB (ref 2.5–4.6)
POTASSIUM: 4.1 mmol/L (ref 3.5–5.1)
Sodium: 134 mmol/L — ABNORMAL LOW (ref 135–145)

## 2016-05-22 LAB — TYPE AND SCREEN
ABO/RH(D): A POS
Antibody Screen: NEGATIVE
UNIT DIVISION: 0
UNIT DIVISION: 0
Unit division: 0
Unit division: 0

## 2016-05-22 LAB — MAGNESIUM: MAGNESIUM: 2.2 mg/dL (ref 1.7–2.4)

## 2016-05-22 MED ORDER — RENA-VITE PO TABS
1.0000 | ORAL_TABLET | Freq: Every day | ORAL | Status: DC
  Administered 2016-05-22 – 2016-05-25 (×3): 1 via ORAL
  Filled 2016-05-22 (×5): qty 1

## 2016-05-22 MED ORDER — PANTOPRAZOLE SODIUM 20 MG PO TBEC
20.0000 mg | DELAYED_RELEASE_TABLET | Freq: Two times a day (BID) | ORAL | Status: DC
  Administered 2016-05-22 – 2016-05-27 (×9): 20 mg via ORAL
  Filled 2016-05-22 (×10): qty 1

## 2016-05-22 MED ORDER — PROMETHAZINE HCL 25 MG/ML IJ SOLN: 6.2500 mg | Freq: Four times a day (QID) | INTRAMUSCULAR | Status: DC | PRN

## 2016-05-22 NOTE — Progress Notes (Signed)
S: Still CO overall pain and wants narcotics O:BP (!) 91/38   Pulse 77   Temp 98 F (36.7 C) (Oral)   Resp 13   Ht 5' (1.524 m)   Wt 73.1 kg (161 lb 3.2 oz)   LMP 09/19/2013   SpO2 100%   BMI 31.48 kg/m   Intake/Output Summary (Last 24 hours) at 05/22/16 0752 Last data filed at 05/22/16 0600  Gross per 24 hour  Intake             2255 ml  Output              551 ml  Net             1704 ml   Weight change: 1.7 kg (3 lb 12 oz) IN:[id number] and alert CVS:RRR 1/6 systolic M Resp:Decreased BS bases Abd:+ BS + distention from ascites, umbilical hernia Ext: 2+ edema  RUA AVF + bruit NEURO:CNI Ox3 no asterixis   . darbepoetin (ARANESP) injection - DIALYSIS  60 mcg Intravenous Q Thu-HD  . doxercalciferol  4 mcg Intravenous Q T,Th,Sa-HD  . lactulose  20 g Oral BID  . mouth rinse  15 mL Mouth Rinse BID  . midodrine  10 mg Oral TID WC  . pantoprazole  40 mg Oral QHS  . piperacillin-tazobactam (ZOSYN)  IV  3.375 g Intravenous Q12H  . rifaximin  550 mg Oral BID  . sevelamer carbonate  1,600 mg Oral TID WC  . sodium chloride flush  3 mL Intravenous Q12H  . sucralfate  1 g Oral TID WC & HS   US Abdomen Complete  Result Date: 05/20/2016 CLINICAL DATA:  Cirrhosis, dialysis patient. Abdominal pain with distention EXAM: ABDOMEN ULTRASOUND COMPLETE COMPARISON:  04/29/2016, CT scan 01/02/2016 FINDINGS: Gallbladder: Shadowing stone measuring 8 mm is present within the gallbladder lumen. No sonographic Murphy's sign per sonographer. Mild wall thickening measuring 3.1 mm. Common bile duct: Diameter: 4.1 mm Liver: Heterogenous echotexture with nodularity consistent with cirrhosis. No gross focal hepatic abnormality. IVC: No abnormality visualized. Pancreas: Visualized portion unremarkable. Spleen: Size and appearance within normal limits. Right Kidney: Length: 9.4 cm. Slight increased cortical echogenicity. No mass or hydronephrosis visualized. Left Kidney: Length: 7.7 cm. Slight increased cortical  echogenicity. No mass or hydronephrosis visualized. Abdominal aorta: No aneurysm visualized. Poor visualization of the distal aorta and bifurcation. Other findings: Large volume of ascites. Tortuous vessels superior to the you right kidney and spleen, consistent with varices. IMPRESSION: 1. Gallstone. Mild nonspecific wall thickening of the gallbladder. Normal common bile duct diameter. 2. Cirrhotic appearing liver. 3. Large volume of ascites. 4. Slightly small appearing kidneys with mild increased cortical echogenicity. Electronically Signed   By: Donavan Foil M.D.   On: 05/20/2016 21:08   Dg Chest Portable 1 View  Result Date: 05/20/2016 CLINICAL DATA:  Central line placement. EXAM: PORTABLE CHEST 1 VIEW COMPARISON:  05/19/2016 FINDINGS: The left IJ central venous catheter tip is in the distal SVC near the cavoatrial junction. No complicating features. The cardiac silhouette, mediastinal and hilar contours are within normal limits and stable. Stable tortuosity and calcification of the thoracic aorta. Low lung volumes with vascular crowding and streaky bibasilar atelectasis. IMPRESSION: Left IJ central venous catheter in good position with the tip near the cavoatrial junction. Electronically Signed   By: Marijo Sanes M.D.   On: 05/20/2016 17:26   BMET    Component Value Date/Time   NA 134 (L) 05/22/2016 0457   K 4.1 05/22/2016 0457  CL 99 (L) 05/22/2016 0457   CO2 23 05/22/2016 0457   GLUCOSE 98 05/22/2016 0457   BUN 68 (H) 05/22/2016 0457   CREATININE 5.48 (H) 05/22/2016 0457   CREATININE 1.46 (H) 06/13/2014 0959   CALCIUM 8.1 (L) 05/22/2016 0457   GFRNONAA 9 (L) 05/22/2016 0457   GFRAA 10 (L) 05/22/2016 0457   CBC    Component Value Date/Time   WBC 14.8 (H) 05/21/2016 2118   RBC 2.53 (L) 05/21/2016 2118   HGB 7.8 (L) 05/21/2016 2118   HCT 23.3 (L) 05/21/2016 2118   PLT 64 (L) 05/21/2016 2118   MCV 92.1 05/21/2016 2118   MCH 30.8 05/21/2016 2118   MCHC 33.5 05/21/2016 2118   RDW  18.3 (H) 05/21/2016 2118   LYMPHSABS 1.5 05/21/2016 2118   MONOABS 1.2 (H) 05/21/2016 2118   EOSABS 0.8 (H) 05/21/2016 2118   BASOSABS 0.1 05/21/2016 2118     Assessment:  1. ESRD  TTS  [facility] 2. Hyperkalemia, resolved 3. GI bleed with esophageal ulcer 4. NASH 5. Chronic Low BP on midodrine 6. Sec HPTH on hectorol  Plan: 1. HD tomorrow 2. Start renavite   Mattea Seger T

## 2016-05-22 NOTE — Evaluation (Signed)
Physical Therapy Evaluation Patient Details Name: Wanda [name] MRN: [id number] DOB: [birth year] Today's Date: 05/22/2016   History of Present Illness  40 yo admitted with sepsis and recurrent GIB. PMHx: cirrhosis secondary to NASH, ESRD, hypotension, bipolar, recent admit Sept 2017 with GIB and paracentesis  Clinical Impression  Pt pleasant with flat affect with difficulty with problem solving and multi step commands. Pt lives with fiance who works and reports varied function at home depending on the day. Pt with bil LE and truncal edema, decreased strength function and mobility who will benefit from acute therapy to maximize mobility, independence and strength. Recommend daily mobility with nursing staff.   BP 108/52 HR 77    Follow Up Recommendations Home health PT    Equipment Recommendations  Rolling walker with 5" wheels    Recommendations for Other Services OT consult     Precautions / Restrictions Precautions Precautions: Fall      Mobility  Bed Mobility Overal bed mobility: Needs Assistance Bed Mobility: Supine to Sit     Supine to sit: Min assist;HOB elevated     General bed mobility comments: cues for sequence with incresed time and use of rail to complete  Transfers Overall transfer level: Needs assistance   Transfers: Sit to/from Stand Sit to Stand: Min guard         General transfer comment: cues for hand placement with increased time to transition to standing with difficulty with anterior translation due to edema of legs  Ambulation/Gait Ambulation/Gait assistance: Min guard Ambulation Distance (Feet): 100 Feet Assistive device: Rolling walker (2 wheeled) Gait Pattern/deviations: Step-to pattern;Wide base of support   Gait velocity interpretation: Below normal speed for age/gender General Gait Details: cues for posture and position in RW  Stairs            Wheelchair Mobility    Modified Rankin (Stroke Patients Only)        Balance Overall balance assessment: Needs assistance   Sitting balance-Leahy Scale: Good       Standing balance-Leahy Scale: Fair                               Pertinent Vitals/Pain Pain Assessment: 0-10 Pain Score: 4  Pain Location: bil calf Pain Descriptors / Indicators: Aching Pain Intervention(s): Limited activity within patient's tolerance;Monitored during session    [place] expects to be discharged to:: Private residence Living Arrangements: Spouse/significant other Available Help at Discharge: Family;Available PRN/intermittently Type of Home: House Home Access: Level entry     Home Layout: One level Home Equipment: Walker - standard;Bedside commode      Prior Function Level of Independence: Independent with assistive device(s);Needs assistance         Comments: uses walker at times. on good days can do everything for herself and at others requires assist for ADLs     Hand Dominance        Extremity/Trunk Assessment   Upper Extremity Assessment: Generalized weakness           Lower Extremity Assessment: Generalized weakness (decreased strength, rOM of ankles and knees limited by edema)      Cervical / Trunk Assessment: Kyphotic;Other exceptions  Communication   Communication: No difficulties  Cognition Arousal/Alertness: Awake/alert Behavior During Therapy: WFL for tasks assessed/performed Overall Cognitive Status: Impaired/Different from baseline Area of Impairment: Orientation;Problem solving Orientation Level: Place           Problem Solving: Slow processing  General Comments: Pt stated she has a son. When asked if he lives her she stated "oh no, he's in [place]", difficulty with multi step commands    General Comments      Exercises     Assessment/Plan    PT Assessment Patient needs continued PT services  PT Problem List Decreased strength;Decreased mobility;Decreased activity tolerance;Decreased  range of motion;Decreased balance;Decreased knowledge of use of DME;Decreased skin integrity          PT Treatment Interventions Gait training;DME instruction;Therapeutic activities;Therapeutic exercise;Patient/family education;Functional mobility training    PT Goals (Current goals can be found in the Care Plan section)  Acute Rehab PT Goals Patient Stated Goal: return home PT Goal Formulation: With patient Time For Goal Achievement: 06/05/16 Potential to Achieve Goals: Fair    Frequency Min 3X/week   Barriers to discharge Decreased caregiver support      Co-evaluation               End of Session Equipment Utilized During Treatment: Gait belt Activity Tolerance: Patient tolerated treatment well Patient left: in chair;with call bell/phone within reach;with chair alarm set Nurse Communication: Mobility status         Time: 1249-1318 PT Time Calculation (min) (ACUTE ONLY): 29 min   Charges:   PT Evaluation $PT Eval Moderate Complexity: 1 Procedure PT Treatments $Therapeutic Activity: 8-22 mins   PT G CodesMelford Aase 05/22/2016, 1:26 PM Elwyn Reach, [place]

## 2016-05-22 NOTE — Progress Notes (Signed)
Attempted report X 1. Nurse in patient's room will call back when able. Ruben Gottron, RN 05/22/2016 8:57 PM

## 2016-05-22 NOTE — Progress Notes (Signed)
Daily Rounding Note  05/22/2016, 10:52 AM  LOS: 3 days   SUBJECTIVE:   Chief complaint: nausea, abdominal pain   No nausea.  Tolerating solids.  Stool dark brown, soft.  Feels like she needs another tap.    OBJECTIVE:         Vital signs in last 24 hours:    Temp:  [97.3 F (36.3 C)-98.5 F (36.9 C)] 98.5 F (36.9 C) (10/13 0823) Pulse Rate:  [42-107] 42 (10/13 0900) Resp:  [13-24] 14 (10/13 0900) BP: (76-129)/(33-54) 125/51 (10/13 0900) SpO2:  [58 %-100 %] 58 % (10/13 0900) Weight:  [72 kg (158 lb 11.7 oz)-73.1 kg (161 lb 3.2 oz)] 73.1 kg (161 lb 3.2 oz) (10/13 0454) Last BM Date: 05/22/16 Filed Weights   05/21/16 1318 05/21/16 1700 05/22/16 0454  Weight: 72.5 kg (159 lb 13.3 oz) 72 kg (158 lb 11.7 oz) 73.1 kg (161 lb 3.2 oz)   General: ill, comfortable   Heart: RRR Chest: clear bil.  No dyspnea Abdomen: distended, diffusely tender without guard or rebound.  Umbilical hernia with deep violet discoloration  Extremities: no CCE Neuro/Psych:  Cooperative, oriented x 3.  Moves all limbs.  No asterixis.   Intake/Output from previous day: 10/12 0701 - 10/13 0700 In: 2255 [P.O.:300; I.V.:875; Blood:860; IV Piggyback:100] Out: [id number] [Blood:1]  Intake/Output this shift: No intake/output data recorded.  Lab Results:  Recent Labs  05/21/16 0118 05/21/16 1030 05/21/16 2118  WBC 16.6* 14.2* 14.8*  HGB 7.1* 6.7* 7.8*  HCT 20.1* 19.6* 23.3*  PLT 89* 75* 64*   BMET  Recent Labs  05/20/16 1630 05/21/16 0450 05/22/16 0457  NA 133* 131* 134*  K 4.8 5.8* 4.1  CL 99* 96* 99*  CO2 20* 20* 23  GLUCOSE 130* 105* 98  BUN 107* 129* 68*  CREATININE 6.77* 7.73* 5.48*  CALCIUM 8.1* 8.0* 8.1*   LFT  Recent Labs  05/19/16 1510 05/20/16 1630 05/21/16 0450 05/22/16 0457  PROT 4.4*  --  3.7*  --   ALBUMIN 2.0* 1.7* 1.7* 1.7*  AST 65*  --  100*  --   ALT 32  --  42  --   ALKPHOS 93  --  60  --   BILITOT 1.2   --  2.1*  --   BILIDIR 0.4  --   --   --   IBILI 0.8  --   --   --    PT/INR  Recent Labs  05/19/16 1510  LABPROT 16.9*  INR 1.36   Hepatitis Panel No results for input(s): HEPBSAG, HCVAB, HEPAIGM, HEPBIGM in the last 72 hours.  Studies/Results: US Abdomen Complete  Result Date: 05/20/2016 CLINICAL DATA:  Cirrhosis, dialysis patient. Abdominal pain with distention EXAM: ABDOMEN ULTRASOUND COMPLETE COMPARISON:  04/29/2016, CT scan 01/02/2016 FINDINGS: Gallbladder: Shadowing stone measuring 8 mm is present within the gallbladder lumen. No sonographic Murphy's sign per sonographer. Mild wall thickening measuring 3.1 mm. Common bile duct: Diameter: 4.1 mm Liver: Heterogenous echotexture with nodularity consistent with cirrhosis. No gross focal hepatic abnormality. IVC: No abnormality visualized. Pancreas: Visualized portion unremarkable. Spleen: Size and appearance within normal limits. Right Kidney: Length: 9.4 cm. Slight increased cortical echogenicity. No mass or hydronephrosis visualized. Left Kidney: Length: 7.7 cm. Slight increased cortical echogenicity. No mass or hydronephrosis visualized. Abdominal aorta: No aneurysm visualized. Poor visualization of the distal aorta and bifurcation. Other findings: Large volume of ascites. Tortuous vessels superior to the you right kidney and  spleen, consistent with varices. IMPRESSION: 1. Gallstone. Mild nonspecific wall thickening of the gallbladder. Normal common bile duct diameter. 2. Cirrhotic appearing liver. 3. Large volume of ascites. 4. Slightly small appearing kidneys with mild increased cortical echogenicity. Electronically Signed   By: Donavan Foil M.D.   On: 05/20/2016 21:08   Dg Chest Portable 1 View  Result Date: 05/20/2016 CLINICAL DATA:  Central line placement. EXAM: PORTABLE CHEST 1 VIEW COMPARISON:  05/19/2016 FINDINGS: The left IJ central venous catheter tip is in the distal SVC near the cavoatrial junction. No complicating  features. The cardiac silhouette, mediastinal and hilar contours are within normal limits and stable. Stable tortuosity and calcification of the thoracic aorta. Low lung volumes with vascular crowding and streaky bibasilar atelectasis. IMPRESSION: Left IJ central venous catheter in good position with the tip near the cavoatrial junction. Electronically Signed   By: Marijo Sanes M.D.   On: 05/20/2016 17:26    ASSESMENT:   *  Recurrent blood loss anemia and GI bleed.  Pt with hx esoph variceal bleeding.   EGD 20 days ago for hematemesis showed eradicated esoph varices, portal gastropathy and MW tear. Did not take PPI at discharge.   On PPI drip, octreotide drip. S/p multiple PRBCs 05/21/16 EGD: non-bleeding distal esophageal ulcer.  Bleeding presumed secondary to ulcer from MW tear.  S/p prbc x 6 here at [facility], 2 at [facility].   *  NASH cirrhosis.  Decompensted.     *  Ascites.  Previous paracentesis x 30 or so from  03/2015 -  05/13/16, and taps before that as indicated by ascites fluid studies dating to 09/2013: no SBP on taps of 2015 - 08/2015.    No fluid studies sent on subsequent paracentesis.  Has had weekly taps recently, 3.5 to 5.1 liters at last 3 taps.  Has standing orders for taps at [facility].    *  ESRD.   *  Elevated Lipase.   *  Hx SBP.  On Zosyn, Vanco for code sepsis.    *  Medical, HD non-compliance.    PLAN   *  Paracentesis.  Today?     Wanda [name]  05/22/2016, 10:52 AM Pager: [phone]    [facility] Attending   I have taken an interval history, reviewed the chart and examined the patient. I agree with the Advanced Practitioner's note, impression and recommendations.    I ordered a paracentesis for tomorrow - if they can Also getting HD tomorrow. Will f/u by Monday Call if you need [place] urgently sooner   Gatha Mayer, MD, [facility] [phone] (pager) [phone] after 5 PM, weekends and holidays  05/22/2016 8:00 PM

## 2016-05-22 NOTE — Progress Notes (Signed)
PULMONARY / CRITICAL CARE MEDICINE   Name: Wanda [name] MRN: [id number] DOB: [birth year]    ADMISSION DATE:  05/19/2016 CONSULTATION DATE:  05/19/2016  REFERRING MD:  Dr. Rolan [facility]  CHIEF COMPLAINT:  GIB  HISTORY OF PRESENT ILLNESS:   40 year old female with complex past medical history as below, which is significant for NASH cirrhosis with recent UGIB and Mallory-Weiss tear 04/2016, ESRD on HD(TTF), chronic hypotension, SBP, and Bipolar disorder. She was admitted 9/20 with melena and coffee ground emesis. She had skipped several dialysis treatments and became nauseated and had dry heaves/wretching. Anemia with hemoglobin to 6.4 requiring 2 units PRBC. EGD the following day demonstrated Mallory-Weiss tear with adherent clot, and varices that remain obliterated from prior treatments. Conservative management was recommended. Bleeding had improved labs normalized so she was discharged to home 9/23 after therapeutic paracentesis.  10/10 she presented to [facility] ED with complaints of weakness and nausea x 2 days. Also complaining of some dark stools. She was hypotensive in ED and iSTAT lactic acid 5.3 so code sepsis was initiated and NS bolus 70ml/kg ordered, but stopped because hemoglobin resulted 3.9. Antibiotics were ordered for infectious concern. 2 units PRBC also ordered. INR WNL. Due to critical illness and likely need for urgent endoscopy, Wanda [name] will be transported to [facility] for ICU admission.   SUBJECTIVE: Soft blood pressure overnight and pain medication was held. Patient was transfused 1u PRBC yesterday. Patient still reporting left scapular and lower back pain. Some abdominal discomfort but no nausea or emesis. No hematochezia. No other chest pain or pressure.   REVIEW OF SYSTEMS:  No dyspnea or cough. No fever or chills. No headache or vision changes.   VITAL SIGNS: BP (!) 125/51   Pulse (!) 42   Temp 98.3 F (36.8 C) (Oral)   Resp 14   Ht 5' (1.524 m)    Wt 161 lb 3.2 oz (73.1 kg)   LMP 09/19/2013   SpO2 (!) 58%   BMI 31.48 kg/m   HEMODYNAMICS: CVP:  [5 mmHg-12 mmHg] 11 mmHg  VENTILATOR SETTINGS:    INTAKE / OUTPUT: I/O last 3 completed shifts: In: 2915 [P.O.:300; I.V.:1485; Blood:860; Other:120; IV Piggyback:150] Out: 551 [Other:550; Blood:1]  PHYSICAL EXAMINATION: General:  Female. No distress. Watching TV.  Fiance at bedside.  Neuro:  Alert and oriented x 4. Grossly nonfocal. Following commands. HEENT:  Moist mucus membranes. No scleral injection.  Cardiovascular:  Regular rhythm and rate. Bilateral lower extremity edema persists. No appreciable JVD. Lungs:  Slightly diminished breath sounds in the bases. Normal work of breathing on room air.  Abdomen:  Soft. Protuberant. Normal bowel sounds. Musculoskeletal:  No joint effusion or deformity. No chest wall deformity. Reported pain with palpation of scapula but no deformity. Skin:  Warm & dry. No rash on exposed skin.   LABS:  BMET  Recent Labs Lab 05/20/16 1630 05/21/16 0450 05/22/16 0457  NA 133* 131* 134*  K 4.8 5.8* 4.1  CL 99* 96* 99*  CO2 20* 20* 23  BUN 107* 129* 68*  CREATININE 6.77* 7.73* 5.48*  GLUCOSE 130* 105* 98    Electrolytes  Recent Labs Lab 05/20/16 1630 05/21/16 0450 05/22/16 0457  CALCIUM 8.1* 8.0* 8.1*  MG 2.2 2.4 2.2  PHOS 7.2* 8.8* 6.8*    CBC  Recent Labs Lab 05/21/16 1030 05/21/16 2118 05/22/16 1000  WBC 14.2* 14.8* 14.4*  HGB 6.7* 7.8* 8.1*  HCT 19.6* 23.3* 24.3*  PLT 75* 64* 67*  Coag's  Recent Labs Lab 05/19/16 1510  INR 1.36    Sepsis Markers  Recent Labs Lab 05/20/16 1018 05/20/16 1630 05/21/16 0730  LATICACIDVEN 1.2 2.8* 1.8    ABG No results for input(s): PHART, PCO2ART, PO2ART in the last 168 hours.  Liver Enzymes  Recent Labs Lab 05/19/16 1510 05/20/16 1630 05/21/16 0450 05/22/16 0457  AST 65*  --  100*  --   ALT 32  --  42  --   ALKPHOS 93  --  60  --   BILITOT 1.2  --  2.1*  --    ALBUMIN 2.0* 1.7* 1.7* 1.7*    Cardiac Enzymes  Recent Labs Lab 05/19/16 2130 05/20/16 0438 05/20/16 1030  TROPONINI <0.03 <0.03 <0.03    Glucose  Recent Labs Lab 05/19/16 1518 05/19/16 1646 05/19/16 2031  GLUCAP 105* 106* 117*    Imaging No results found.   STUDIES:  EGD 9/21: Mallory-Weiss tear without active bleeding currently. Esophageal mucosa scars present ?varices pretty well obliterated. Hiatal hernia. Congested portal gastropathy Abd U/S 10/11:  Gallstone. Mild nonspecific wall thickening of the gallbladder. Normal common bile duct diameter. Large volume of ascites. Slightly small appearing kidneys with mild increased cortical echogenicity. EGD:  Esophageal ulcer. No varices.   MICROBIOLOGY: MRSA PCR 10/10:  Negative  Blood Ctx x2 10/10 >>  ANTIBIOTICS: Ceftaz 10/10 - 10/11 Vancomycin 10/10 - 10/12 Zosyn 10/10>>  SIGNIFICANT EVENTS: 9/20-9/23 admit to [facility] for mallory weiss tear, clotted on EGD and managed conservatively.  10/10 - admit with severe anemia w/ GI Bleeding   LINES/TUBES: L IJ CVL 10/11 >> PIV x1  ASSESSMENT / PLAN:  GASTROINTESTINAL A:   Gastrointestinal Bleed - Recent Mallory-Weiss tear. Esophageal ulcer on EGD 10/12. H/O NASH Cirrhosis Protein-Calorie Malnutrition H/O SBP H/O Varices w/ banding  P:   Renal Diet GI Following & appreciate assistance Protonix PO bid Carafate qid Home Lactulose & Xifaxan  Likely will need paracentesis but defer to GI  HEMATOLOGIC A:   Anemia - Secondary to acute blood loss. S/P 5u PRBC on 10/11. & 1u 10/12. Stable post transfusion. H/O Anemia of Chronic Illness Leukocytosis - Multifactorial. Stable. Thrombocytopenia - Stable. Likely previously hemoconcentration. Returning to baseline.  P:  Trending Hgb/Hct q12hr Trending cell counts daily SCDs  PULMONARY A: Acute Hypoxic Respiratory Failure - Likely secondary to volume.  P:   Weaning FiO2 for Sat >92% Continuous pulse  oximetry  CARDIOVASCULAR A:  Shock/Borderline Hypotension - Hemorrhagic. Resolved with volume and blood products.  P:  Continuous telemetry monitoring Vitals per unit protocol MAP goal > 47mmHg Midodrine TID WC  RENAL A:   ESRD on HD (TTF) Hyperkalemia - Resolved post HD. Hyponatremia - Mild. Stable. Hypochloremia - Mild. Stable. Metabolic Acidosis - Resolved. Lactic Acidosis -Resolved.  P:   Nephrology Following HD per Nephrology Monitoring electrolytes closely Replacing electrolytes as indicated Renvela tid WC  INFECTIOUS A:   SIRS - No clear source of infection. H/O SBP  P:   Continuing Empiric Zosyn Day #4 Can likely discontinue Zosyn if paracentesis w/o signs of infection Awaiting Cultures Trending Procalcitonin per Algorithm   ENDOCRINE A:   No acute issues    P:   Monitor glucose on chemistries  NEUROLOGIC A:   Acute Encephalopathy - Multfactorial. Resolved. Likely due to anemia & hypotension. NH4 34.   P:   RASS goal: 0 Monitor closely Percocet PO prn  FAMILY  - Updates: Patient and Fiance updated at bedside 10/13 by Dr. Ashok Cordia.   -  Inter-disciplinary family meet or Palliative Care meeting due by:  10/17  TODAY'S SUMMARY:  40 y.o. female with NASH cirrhosis and ESRD. History GIB with recent mallory weiss tear. Now presenting with hemorrhagic shock likely due to recurrent GI bleeding. Esophageal ulcer found on EGD 10/12. Tolerating diet. Hemoglobin seems to be stable without signs of active bleeding. Borderline hypotension improved on Midodrine. Appreciate assistance from GI and Nephrology. Patient may benefit from paracentesis but will defer to GI. Transferring patient to step-down unit.  [facility] will assume care & PCCM will sign off as of 10/14.   Sonia Baller Ashok Cordia, M.D. [facility] Pulmonary & Critical Care Pager:  [phone] After 3pm or if no response, call [phone] 05/22/2016 11:31 AM

## 2016-05-22 NOTE — Progress Notes (Signed)
PULMONARY / CRITICAL CARE MEDICINE   Name: Wanda [name] MRN: [id number] DOB: 1976-07-14    ADMISSION DATE:  05/19/2016   CHIEF COMPLAINT:  shock  HISTORY OF PRESENT ILLNESS:   40 year old female with complex past medical history as below, which is significant for NASH cirrhosis with recent UGIB and Mallory-Weiss tear 04/2016, Esophageal Varices, ESRD on HD(TTF), chronic hypotension, SBP, Thrombocytopenia, and Bipolar disorder. She was admitted 9/20 with melena and coffee ground emesis. She had skipped several dialysis treatments and became nauseated and had dry heaves/wretching. Anemia with hemoglobin to 6.4 requiring 2 units PRBC. EGD the following day demonstrated Mallory-Weiss tear with adherent clot, and varices that remain obliterated from prior treatments. Conservative management was recommended. Bleeding had improved labs normalized so she was discharged to home 9/23 after therapeutic paracentesis.  10/10 she presented to [facility] ED with complaints of weakness and nausea x 2 days. Also complaining of some dark stools. She was hypotensive in ED and iSTAT lactic acid 5.3 so code sepsis was initiated and NS bolus 47ml/kg ordered, but stopped because hemoglobin resulted 3.9. Antibiotics were ordered for infectious concern. 2 units PRBC also ordered. INR WNL. Due to critical illness and likely need for urgent endoscopy, Wanda [name] was transported to [facility] for ICU admission.   PAST MEDICAL HISTORY :  She  has a past medical history of Acute blood loss anemia (02/25/2014); Acute renal failure ([place]) (09/2013); Anasarca (10/10/2013); Anxiety; Bipolar disorder ([place]) (12/04/2013); Bleeding esophageal varices ([facility]) (02/28/2014); C. difficile colitis (04/19/2014); Chronic hypotension; Cirrhosis ([place]) (10/05/13); Cirrhosis of liver with ascites ([place]); Depression; ESRD (end stage renal disease) on dialysis ([place]) (08/2014); Folate deficiency (09/2013); Gastroesophageal junction ulcer (09/17/2014); GERD  (gastroesophageal reflux disease); Hematemesis/vomiting blood (02/24/2014); Macrocytosis (02/28/2014); PNA (pneumonia) (10/13/2013); SBP (spontaneous bacterial peritonitis) ([place]) (11/10/2013); and Thrombocytopenia ([place]).  PAST SURGICAL HISTORY: She  has a past surgical history that includes None; Paracentesis (Feb 2015); Esophagogastroduodenoscopy (N/A, 11/14/2013); Paracentesis (10/2013); Colonoscopy (N/A, 12/19/2013); Esophagogastroduodenoscopy (N/A, 02/11/2014); Esophagogastroduodenoscopy (N/A, 07/04/2014); esophageal banding (07/04/2014); Esophagogastroduodenoscopy (egd) with propofol (N/A, 07/24/2014); esophageal banding (N/A, 07/24/2014); Central venous catheter insertion (Right); Esophagogastroduodenoscopy (N/A, 09/16/2014); Esophagogastroduodenoscopy (egd) with propofol (N/A, 10/26/2014); AV fistula placement (Right, 11/16/2014); Esophagogastroduodenoscopy (N/A, 12/14/2014); Esophagogastroduodenoscopy (N/A, 03/27/2015); Esophagogastroduodenoscopy (N/A, 06/05/2015); Esophagogastroduodenoscopy (N/A, 07/23/2015); esophageal banding (N/A, 07/23/2015); Esophagogastroduodenoscopy (N/A, 03/04/2016); esophageal banding (N/A, 03/04/2016); Esophagogastroduodenoscopy (egd) with propofol (N/A, 04/30/2016); and esophageal banding (N/A, 04/30/2016).  Allergies  Allergen Reactions  . Lasix [Furosemide] Other (See Comments)    "doesn't work"  . Latex Itching  . Morphine And Related Hives and Itching    No current facility-administered medications on file prior to encounter.    Current Outpatient Prescriptions on File Prior to Encounter  Medication Sig  . clotrimazole (LOTRIMIN) 1 % cream APPLY TOPICALLY THREE TIMES DAILY FOR 10 DAYS  . Darbepoetin Alfa (ARANESP) 60 MCG/0.3ML SOSY injection Inject 0.3 mLs (60 mcg total) into the vein every Thursday with hemodialysis. (Patient taking differently: Inject 60 mcg into the vein every Tuesday with hemodialysis. )  . lactulose (CHRONULAC) 10 GM/15ML solution TAKE 3 TEASPOONSFUL (15ML)  BY MOUTH TWICE DAILY (Patient taking differently: Take 10gm by mouth twice daily as needed for constipation)  . lidocaine (XYLOCAINE) 2 % solution TAKE TWO TEASPOONSFUL (10ML) BY MOUTH BEFORE MEALS AND AT BEDTIME TO PREVENT CHEST PAIN WHILE EATING  . lidocaine-prilocaine (EMLA) cream Apply 1 application topically as needed (for dialysis treatments).  . LORazepam (ATIVAN) 1 MG tablet TAKE 1/2 TO 1 TABLET BY MOUTH TWICE DAILY AS  NEEDED FOR BACK SPASM OR FOR ANXIETY (Patient taking differently: Take 0.5-1 mg by mouth 2 (two) times daily as needed for anxiety (or back spasms). )  . midodrine (PROAMATINE) 10 MG tablet Take 1 tablet (10 mg total) by mouth daily. *Takes only on dialysis days on Tuesdays, Thursdays, and Saturdays (Sometimes takes treatments on Friday instead of Saturday)**Medication to keep your blood pressure from falling. (Patient taking differently: Take 10 mg by mouth See admin instructions. Take 10 mg by mouth on dialysis days on Tuesdays, Thursdays, and Saturdays (Sometimes takes treatments on Friday instead of Saturday))  . Oxycodone HCl 10 MG TABS 1 PO TID AS NEEDED FOR PAIN (Patient taking differently: Take 10 mg by mouth 3 (three) times daily as needed. For pain)  . RENVELA 800 MG tablet Take 1600 mg by mouth 3 times daily with meals. Take 800 mg by mouth with snacks.  . pantoprazole (PROTONIX) 40 MG tablet Take 1 tablet (40 mg total) by mouth 2 (two) times daily. (Patient not taking: Reported on 05/20/2016)  . promethazine (PHENERGAN) 12.5 MG tablet TAKE ONE TABLET BY MOUTH EVERY 6 HOURS AS NEEDED FOR NAUSEA AND VOMITING. (Patient not taking: Reported on 05/20/2016)  . rifaximin (XIFAXAN) 550 MG TABS tablet Take 1 tablet (550 mg total) by mouth 2 (two) times daily before a meal. (Patient not taking: Reported on 05/20/2016)    FAMILY HISTORY:  Her indicated that her mother is deceased. She indicated that her father is deceased. She indicated that her sister is alive. She indicated  that her brother is alive. She indicated that the status of her neg hx is unknown.    SOCIAL HISTORY: She  reports that she quit smoking about 7 years ago. Her smoking use included Cigarettes. She has a 5.00 pack-year smoking history. She has never used smokeless tobacco. She reports that she does not drink alcohol or use drugs.  REVIEW OF SYSTEMS:   Reports of back pain    SUBJECTIVE:  Patient noted to have MAPs < 60. Pain medication held. Had HD 10/12 with 550 out. Reports of back pain this AM.   VITAL SIGNS: BP (!) 87/41   Pulse 73   Temp 98 F (36.7 C) (Oral)   Resp 13   Ht 5' (1.524 m)   Wt 73.1 kg (161 lb 3.2 oz)   LMP 09/19/2013   SpO2 100%   BMI 31.48 kg/m   HEMODYNAMICS: CVP:  [5 mmHg-12 mmHg] 12 mmHg  VENTILATOR SETTINGS:    INTAKE / OUTPUT: I/O last 3 completed shifts: In: 4937.5 [P.O.:300; I.V.:2560; Blood:1467.5; Other:10; IV Piggyback:600] Out: 251 [Other:250; Blood:1]  PHYSICAL EXAMINATION: GEN: NAD, tearful  HEENT: sclera mildly icterus, PERRL CV: RRR,systolic murmur noted  PULM: CTAB, normal effort ABD: distended, abdominal hernia noted in left lower quadrant of abdomen. Diffuse "fullness" of abdomen per patient on palpation, + BS SKIN: No rash or cyanosis; warm and well-perfused EXTR: pitting edema of LE; able to move all extremities  NEURO: Awake, alert, no focal deficits grossly, normal speech   LABS:  BMET  Recent Labs Lab 05/20/16 1630 05/21/16 0450 05/22/16 0457  NA 133* 131* 134*  K 4.8 5.8* 4.1  CL 99* 96* 99*  CO2 20* 20* 23  BUN 107* 129* 68*  CREATININE 6.77* 7.73* 5.48*  GLUCOSE 130* 105* 98    Electrolytes  Recent Labs Lab 05/20/16 1630 05/21/16 0450 05/22/16 0457  CALCIUM 8.1* 8.0* 8.1*  MG 2.2 2.4 2.2  PHOS 7.2* 8.8* 6.8*  CBC  Recent Labs Lab 05/21/16 0118 05/21/16 1030 05/21/16 2118  WBC 16.6* 14.2* 14.8*  HGB 7.1* 6.7* 7.8*  HCT 20.1* 19.6* 23.3*  PLT 89* 75* 64*    Coag's  Recent  Labs Lab 05/19/16 1510  INR 1.36    Sepsis Markers  Recent Labs Lab 05/20/16 1018 05/20/16 1630 05/21/16 0730  LATICACIDVEN 1.2 2.8* 1.8    ABG No results for input(s): PHART, PCO2ART, PO2ART in the last 168 hours.  Liver Enzymes  Recent Labs Lab 05/19/16 1510 05/20/16 1630 05/21/16 0450 05/22/16 0457  AST 65*  --  100*  --   ALT 32  --  42  --   ALKPHOS 93  --  60  --   BILITOT 1.2  --  2.1*  --   ALBUMIN 2.0* 1.7* 1.7* 1.7*    Cardiac Enzymes  Recent Labs Lab 05/19/16 2130 05/20/16 0438 05/20/16 1030  TROPONINI <0.03 <0.03 <0.03    Glucose  Recent Labs Lab 05/19/16 1518 05/19/16 1646 05/19/16 2031  GLUCAP 105* 106* 117*    Imaging No results found.   STUDIES:  EGD 9/21 > Mallory-Weiss tear without active bleeding currently. Esophageal mucosa scars present ?varices pretty well obliterated. Hiatal hernia. Congested portal gastropathy CXR 10/10: low lung volume, no acute finding  US Abdomen 10/11: gall stone without obstruction, normal common bile duct diameter, cirrhotic appearing liver, large volume ascites, small appearing kidneys with increased cortical echogenicity.  EGD 10/11: non-bleeding esophageal ulcer. Presumed persistent ulcer from mallory weiss.   CULTURES: BCx2 10/10 > NGx 2 days  ANTIBIOTICS: Zosyn 10/10  In ED; 10/11 >  Vancomycin 10/10 in ED; 10/11>> 10/12 Ceftaz 10/10 x1  SIGNIFICANT EVENTS: 9/20-9/23 admit to [facility] for mallory weiss tear, clotted on EGD and managed conservatively.  10/10 admit with severe ABLA  LINES/TUBES: AV Fistula R upper Arm PIV x 1 Left IJ 10/11 >>   ASSESSMENT / PLAN:  40 year old female with PMH of NASH cirrhosis with recent UGIB and Mallory-Weiss tear 04/2016, ESRD on HD(TTF), chronic hypotension, SBP, and Bipolar disorder. She presented on 10/10 with weakness and nausea x 2 days, dark stools and was found to be hypotensive, lactic acidosis, and significant anemia thought to be due to  hemorrhageic shock likely due to recurrent GI bleed. EGD 10/12 showed non-bleeding esophageal ulcer.   ASSESSMENT / PLAN: GASTROINTESTINAL A:   Gastrointestinal bleeding with recent history of Mallory-Weiss tear: non-bleeding esophageal ulcer on EGD on 10/11 NASH cirrhosis Protein calorie malnutrition Abdominal Ascites: large volume per ultrasound on 10/11 Transaminitis: mild, US abdomen with no obstructing stone but with cirrhosis. Possibly due to hypoperfusion  P:   Start renal diet with fluid restriction  Protonix infusion today, then switch to PO  DC Octreotide infusion per GI GI consulted, appreciate recommendations:  sucralfate suspension 1 gram PO QID, stay on IV PPI today and switch to PO tomorrow; DC octreotide.  Restart Lactulose and Rifaximin  Consider paracentesis to evaluate for SBP; will hold off for now   PULMONARY A: No acute issues  P:   Supplemental O2 in setting profound anemia.   CARDIOVASCULAR A:  Hemorrhagic shock > improved with volume and minimal blood products. S/p 5 units.   Chest Pain, Intermittent: EKG without signs of ischemia, troponin negative x 3.  Hypotension: chronic but worse during admission. Improved this AM   P:  MAP goal > 19mmHg Midrodrine 10mg  TID  See GI   RENAL A:   ESRD on HD (TTF): missed  two sessions of HD as outpatient prior to admission Hyperkalemia: resolved  Hyponatremia, improving and seems back at baseline  Hypochloremia: in the setting of ESRD  Hyperphoshatemia: in the setting of ESRD  High AG acidosis - resolved  Lactic Acidosis- 5.98 >1.2 >2.8 >1.8  P:   Nephrology Consulted, appreciate recommendations: HD 10/14 Home [place]   HEMATOLOGIC A:   Acute blood loss anemia: s/p 6 units pRBC. Hgb above goal this AM.  Anemia of chronic illness (Hgb 10 at time of recent discharge) Thrombocytopenia:  Thought to be due to comsumption  P:  Follow CBC q 12 hours Transfuse if hemoglobin < 7 or actively  bleeding Monitor with labs    INFECTIOUS A:   SIRS, no clear infectious source, must consider SBP: resolved. HR now wnl, but still has leukocytosis. Blood culture shows no growth thus far. Patient has been afebrile.   P:   DC vanc (3 days)  Continue Zosyn  (Day #4) Cultures  ENDOCRINE A:   No acute issues    P:   Monitor glucose on chemistries  NEUROLOGIC A:   Acute metabolic encephalopathy, likely hepatic.: Resolved, Ammonia level within normal limits.  Chronic Pain  P:   RASS goal: 0 Monitor closely Home Percocet at reduced dose PRN   FAMILY  - Family  - Inter-disciplinary family meet or Palliative Care meeting due by:  10/17  Smiley Houseman, MD PGY 2 Family Medicine   05/22/2016, 6:45 AM

## 2016-05-22 NOTE — Progress Notes (Signed)
Pharmacy Antibiotic Note  Wanda [name] is a 40 y.o. female admitted on 05/19/2016 with sepsis.  Pharmacy has been consulted for Zosyn dosing due to concern for SBP. Per MD, to consider narrowing once the patient receives a paracentesis and counts can be seen.   Plan: 1. Continue Zosyn 3.375g (infused over 4 hours) every 12 hours 2. Will continue to follow HD schedule/duration, culture results, LOT, and antibiotic de-escalation plans   Height: 5' (152.4 cm) Weight: 161 lb 3.2 oz (73.1 kg) IBW/kg (Calculated) : 45.5  Temp (24hrs), Avg:98 F (36.7 C), Min:97.3 F (36.3 C), Max:98.5 F (36.9 C)   Recent Labs Lab 05/19/16 1529  05/19/16 1814  05/20/16 0438 05/20/16 1018  05/20/16 1154 05/20/16 1630 05/21/16 0118 05/21/16 0450 05/21/16 0730 05/21/16 1030 05/21/16 2118 05/22/16 0457 05/22/16 1000  WBC  --   < >  --   < > 20.9*  --   < >  --  21.4* 16.6*  --   --  14.2* 14.8*  --  14.4*  CREATININE  --   --   --   < > 10.52*  --   --  4.90* 6.77*  --  7.73*  --   --   --  5.48*  --   LATICACIDVEN 5.39*  --  5.98*  --   --  1.2  --   --  2.8*  --   --  1.8  --   --   --   --   < > = values in this interval not displayed.  Estimated Creatinine Clearance: 11.9 mL/min (by C-G formula based on SCr of 5.48 mg/dL (H)).    Allergies  Allergen Reactions  . Lasix [Furosemide] Other (See Comments)    "doesn't work"  . Latex Itching  . Morphine And Related Hives and Itching    Antimicrobials this admission: 10/11 Ceftaz>>10/11 10/10 Vanc x1, restart 10/11 >> 10/12 10/10 Zosyn x1, restart 10/11 >>  Dose adjustments this admission:   Microbiology results:  10/10 BCx: ngx3d 10/10 UCx: sent  10/10 MRSA PCR: negative  Thank you for allowing pharmacy to be a part of this patient's care.  Alycia Rossetti, PharmD, BCPS Clinical Pharmacist Pager: [phone] 05/22/2016 11:43 AM

## 2016-05-22 NOTE — Care Management Important Message (Signed)
Important Message  Patient Details  Name: Wanda [name] MRN: [id number] Date of Birth: 1975-12-30   Medicare Important Message Given:  Yes    Blessed Cotham Abena 05/22/2016, 9:50 AM

## 2016-05-22 NOTE — Progress Notes (Signed)
Pt only taking 10g lactulose and 1/2 tablet rifampin each dosage because she "is not comfortable with these new pills".

## 2016-05-23 DIAGNOSIS — D62 Acute posthemorrhagic anemia: Secondary | ICD-10-CM

## 2016-05-23 DIAGNOSIS — K2211 Ulcer of esophagus with bleeding: Principal | ICD-10-CM

## 2016-05-23 LAB — RENAL FUNCTION PANEL
ALBUMIN: 1.8 g/dL — AB (ref 3.5–5.0)
Anion gap: 14 (ref 5–15)
BUN: 80 mg/dL — AB (ref 6–20)
CALCIUM: 8.5 mg/dL — AB (ref 8.9–10.3)
CO2: 21 mmol/L — ABNORMAL LOW (ref 22–32)
CREATININE: 6.69 mg/dL — AB (ref 0.44–1.00)
Chloride: 96 mmol/L — ABNORMAL LOW (ref 101–111)
GFR calc Af Amer: 8 mL/min — ABNORMAL LOW (ref >60)
GFR, EST NON AFRICAN AMERICAN: 7 mL/min — AB (ref >60)
Glucose, Bld: 125 mg/dL — ABNORMAL HIGH (ref 65–99)
PHOSPHORUS: 7.7 mg/dL — AB (ref 2.5–4.6)
Potassium: 3.9 mmol/L (ref 3.5–5.1)
Sodium: 131 mmol/L — ABNORMAL LOW (ref 135–145)

## 2016-05-23 LAB — MAGNESIUM: MAGNESIUM: 2.2 mg/dL (ref 1.7–2.4)

## 2016-05-23 MED ORDER — DOXERCALCIFEROL 4 MCG/2ML IV SOLN
INTRAVENOUS | Status: AC
  Filled 2016-05-23: qty 2

## 2016-05-23 MED ORDER — LORAZEPAM 0.5 MG PO TABS
0.5000 mg | ORAL_TABLET | Freq: Two times a day (BID) | ORAL | Status: DC | PRN
  Administered 2016-05-24 – 2016-05-26 (×3): 0.5 mg via ORAL
  Filled 2016-05-23 (×3): qty 1

## 2016-05-23 MED ORDER — LORAZEPAM 0.5 MG PO TABS: 0.5000 mg | ORAL_TABLET | Freq: Two times a day (BID) | ORAL | Status: DC | PRN

## 2016-05-23 MED ORDER — MORPHINE SULFATE (PF) 2 MG/ML IV SOLN
2.0000 mg | INTRAVENOUS | Status: DC | PRN
  Filled 2016-05-23: qty 1

## 2016-05-23 MED ORDER — FENTANYL CITRATE (PF) 100 MCG/2ML IJ SOLN
12.5000 ug | INTRAMUSCULAR | Status: DC | PRN
Start: 2016-05-23 — End: 2016-05-27
  Administered 2016-05-23 – 2016-05-27 (×8): 12.5 ug via INTRAVENOUS
  Filled 2016-05-23 (×4): qty 2

## 2016-05-23 MED ORDER — OXYCODONE HCL 5 MG PO TABS
5.0000 mg | ORAL_TABLET | Freq: Four times a day (QID) | ORAL | Status: DC | PRN
  Administered 2016-05-23: 5 mg via ORAL

## 2016-05-23 MED ORDER — OXYCODONE HCL 5 MG PO TABS
ORAL_TABLET | ORAL | Status: AC
  Administered 2016-05-23: 5 mg via ORAL
  Filled 2016-05-23: qty 1

## 2016-05-23 MED ORDER — DEXTROSE 5 % IV SOLN
1.0000 g | INTRAVENOUS | Status: DC
  Administered 2016-05-23 – 2016-05-27 (×4): 1 g via INTRAVENOUS
  Filled 2016-05-23 (×6): qty 10

## 2016-05-23 MED ORDER — OXYCODONE HCL 5 MG PO TABS
5.0000 mg | ORAL_TABLET | ORAL | Status: DC | PRN
  Administered 2016-05-23 – 2016-05-27 (×18): 10 mg via ORAL
  Filled 2016-05-23 (×16): qty 2

## 2016-05-23 NOTE — Plan of Care (Signed)
Problem: Pain Managment: Goal: General experience of comfort will improve Outcome: Not Progressing Minimal pain control with po meds.   Problem: Nutrition: Goal: Adequate nutrition will be maintained Outcome: Progressing Eating without N/V

## 2016-05-23 NOTE — Procedures (Signed)
Pt seen on HD  AP 180  Vp 140  BFR 400.  SBP 91.  Tolerating HD well so far.

## 2016-05-23 NOTE — Progress Notes (Signed)
Progress Note  Assessment / Plan:   Assessment: * Recurrent blood loss anemia and GI bleed. Pt with hx esoph variceal bleeding. EGD 20 days ago for hematemesis showed eradicated esoph varices, portal gastropathy and MW tear. Did not take PPI at discharge.  On PPI drip, octreotide drip. S/p multiple PRBCs 05/21/16 EGD: non-bleeding distal esophageal ulcer.  Bleeding presumed secondary to ulcer from MW tear.  S/p prbc x 6 here at [facility], 2 [facility].   *  NASH cirrhosis.  Decompensted.     *  Ascites.  Previous paracentesis x 30 or so from  03/2015 -  05/13/16, and taps before that as indicated by ascites fluid studies dating to 09/2013: no SBP on taps of 2015 - 08/2015.    No fluid studies sent on subsequent paracentesis.  Has had weekly taps recently, 3.5 to 5.1 liters at last 3 taps.  Has standing orders for taps at [facility].    * ESRD.   * Elevated Lipase.   * Hx SBP. On Zosyn, Vanco for code sepsis.   * Medical, HD non-compliance.   Plan: 1. Paracentesis ordered, unclear whether this be completed today or tomorrow, discussed with the patient 2. Patient is proceeding to hemodialysis this afternoon. 3. Please await any further recommendations from Dr. Carlean Purl.  Thank you for your kind consultation, we will continue to follow   LOS: 4 days   Levin Erp  05/23/2016, 9:34 AM  Pager # [phone]    [facility] Attending   I have taken an interval history, reviewed the chart and examined the patient. I agree with the Advanced Practitioner's note, impression and recommendations.    No new recommendations - looks like HD today and paracentesis tomorrow. Stay on PPI and carafate for MW-tear/esophageal ulcer. Regular quinolone for SBP prophylaxis - daily or less if renal dosing requires   Home soon?  We will sign off - she can see Dr. Gala Romney and his team in f/u in [place].  Gatha Mayer, MD, [facility] [phone]  (pager) [phone] after 5 PM, weekends and holidays  05/23/2016 2:24 PM   Subjective  Chief Complaint: Nausea, abdominal pain  Patient is sitting up in bed having just finished a regular diet, she tells me that she is eating more than she does at home because she is "bored". She denies any new symptoms but continues to complain of increasing abdominal distention and discomfort. She is aware of plans for hemodialysis today and possible paracentesis.    Objective   Vital signs in last 24 hours: Temp:  [96.7 F (35.9 C)-98.3 F (36.8 C)] 97.8 F (36.6 C) (10/14 0700) Pulse Rate:  [58-88] 60 (10/14 0530) Resp:  [8-20] 12 (10/14 0700) BP: (82-112)/(32-52) 96/45 (10/14 0700) SpO2:  [98 %-100 %] 99 % (10/14 0700) Weight:  [165 lb 5.5 oz (75 kg)] 165 lb 5.5 oz (75 kg) (10/14 0500) Last BM Date: 05/23/16 General: Caucasian female in NAD Heart:  Regular rate and rhythm; no murmurs Lungs: Respirations even and unlabored, lungs CTA bilaterally Abdomen:  Soft, moderately distended, diffusely tender without rebound or guarding, umbilical hernia with a pilot discoloration. Normal bowel sounds. Neurologic:  Alert and oriented,  grossly normal neurologically. Psych:  Cooperative. Normal mood and affect.  Intake/Output from previous day: 10/13 0701 - 10/14 0700 In: 791 [P.O.:466; I.V.:225; IV Piggyback:100] Out: -   Lab Results:  Recent Labs  05/21/16 2118 05/22/16 1000 05/22/16 2244  WBC 14.8* 14.4* 15.4*  HGB 7.8*  8.1* 8.4*  HCT 23.3* 24.3* 26.1*  PLT 64* 67* 80*   BMET  Recent Labs  05/21/16 0450 05/22/16 0457 05/23/16 0406  NA 131* 134* 131*  K 5.8* 4.1 3.9  CL 96* 99* 96*  CO2 20* 23 21*  GLUCOSE 105* 98 125*  BUN 129* 68* 80*  CREATININE 7.73* 5.48* 6.69*  CALCIUM 8.0* 8.1* 8.5*

## 2016-05-23 NOTE — Progress Notes (Signed)
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 She was admitted 9/20 with melena and coffee ground emesis. She had skipped several dialysis treatments and became nauseated and had dry heaves/wretching. Anemia with hemoglobin to 6.4 requiring 2 units PRBC. EGD the following day demonstrated Mallory-Weiss tear with adherent clot, and varices that remain obliterated from prior treatments. Conservative management was recommended. Bleeding had improved and labs normalized so she was discharged to home 9/23 after a therapeutic paracentesis.  10/10 she presented to [facility] ED with complaints of weakness and nausea x 2 days with some dark stools. She was hypotensive in the ED and iSTAT lactic acid was 5.3.  Hemoglobin was 3.9. Due to critical illness Mrs. Roton was transported to [facility] for ICU admission.   Significant Events: 9/20-9/23 admit to [facility] for mallory weiss tear, clotted on EGD and managed conservatively.  10/10 - admit with severe anemia w/ GI Bleeding  10/12 EGD > non-bleeding distal esophageal ulcer   Subjective: Pt is somewhat slow to answer questions, but is alert and conversant.  She denies cp, vomiting, or sob.  She c/o abdom distention and discomfort with nausea.    Assessment & Plan:  Upper Gastrointestinal Bleed Recent Mallory-Weiss tear - Esophageal ulcer on EGD 10/12 - did not take PPI after recent d/c   Acute blood loss anemia w/ anemia of chronic disease S/P 5u PRBC on 10/11 & 1u 10/12 - Hgb stable at this time - cont to follow closely in SDU   Hemorrhagic Shock Resolved with volume and blood products  NASH Cirrhosis - Hx of SBP and Varices s/p banding To have paracentesis today    Protein-Calorie  Malnutrition Encouraged pt to improve intake   Thrombocytopenia Stable   Acute Hypoxic Respiratory Failure Likely secondary to volume - resolved     ESRD on HD (TTF) Nephrology following - for HD today   Hyponatremia Chronic in setting of liver disease - follow up in AM post HD today   Acute Encephalopathy - Multfactorial Essentially resolved - likely due to anemia & hypotension  DVT prophylaxis: SCDs Code Status: FULL CODE Family Communication: no family present at time of exam  Disposition Plan: transfer to SDU for an additional 24hrs of monitoring   Consultants:  PCCM Nephrology [place] [facility]  Antimicrobials:  Ceftaz 10/10 Vancomycin 10/10 > 10/11 Zosyn 10/10 >  Objective: Blood pressure (!) 96/45, pulse 60, temperature 97.8 F (36.6 C), temperature source Oral, resp. rate 12, height 5' (1.524 m), weight 75 kg (165 lb 5.5 oz), last menstrual period 09/19/2013, SpO2 99 %.  Intake/Output Summary (Last 24 hours) at 05/23/16 0901 Last data filed at 05/23/16 0248  Gross per 24 hour  Intake              791 ml  Output                0 ml  Net              791 ml   Filed Weights   05/21/16 1700 05/22/16 0454 05/23/16 0500  Weight: 72 kg (158 lb 11.7 oz) 73.1 kg (161 lb 3.2 oz) 75 kg (165 lb 5.5 oz)    Examination: General: No acute respiratory distress Lungs: Clear  to auscultation bilaterally without wheezes or crackles Cardiovascular: Regular rate and rhythm without murmur gallop or rub normal S1 and S2 Abdomen: distended, umbilical hernia protuberant but w/o worrisome findings, diffusely tender across abdom, no rebound  Extremities: No significant cyanosis, or clubbing - 2+ edema bilateral lower extremities  CBC:  Recent Labs Lab 05/21/16 0118 05/21/16 1030 05/21/16 2118 05/22/16 1000 05/22/16 2244  WBC 16.6* 14.2* 14.8* 14.4* 15.4*  NEUTROABS 12.2* 10.7* 11.2* 10.2* 10.9*  HGB 7.1* 6.7* 7.8* 8.1* 8.4*  HCT 20.1* 19.6* 23.3* 24.3* 26.1*  MCV 88.2 91.6  92.1 93.1 94.9  PLT 89* 75* 64* 67* 80*   Basic Metabolic Panel:  Recent Labs Lab 05/19/16 1400  05/20/16 0438 05/20/16 1154 05/20/16 1630 05/21/16 0450 05/22/16 0457 05/23/16 0406  NA  --   < > 128* 133* 133* 131* 134* 131*  K  --   < > >7.5* 2.9* 4.8 5.8* 4.1 3.9  CL  --   < > 96* 92* 99* 96* 99* 96*  CO2  --   < > 14*  --  20* 20* 23 21*  GLUCOSE  --   < > 116* 99 130* 105* 98 125*  BUN  --   < > 195* 88* 107* 129* 68* 80*  CREATININE  --   < > 10.52* 4.90* 6.77* 7.73* 5.48* 6.69*  CALCIUM  --   < > 8.5*  --  8.1* 8.0* 8.1* 8.5*  MG 3.1*  --   --   --  2.2 2.4 2.2 2.2  PHOS  --   --   --   --  7.2* 8.8* 6.8* 7.7*  < > = values in this interval not displayed. GFR: Estimated Creatinine Clearance: 9.9 mL/min (by C-G formula based on SCr of 6.69 mg/dL (H)).  Liver Function Tests:  Recent Labs Lab 05/19/16 1510 05/20/16 1630 05/21/16 0450 05/22/16 0457 05/23/16 0406  AST 65*  --  100*  --   --   ALT 32  --  42  --   --   ALKPHOS 93  --  60  --   --   BILITOT 1.2  --  2.1*  --   --   PROT 4.4*  --  3.7*  --   --   ALBUMIN 2.0* 1.7* 1.7* 1.7* 1.8*    Recent Labs Lab 05/19/16 1510  LIPASE 152*    Recent Labs Lab 05/19/16 1510  AMMONIA 34    Coagulation Profile:  Recent Labs Lab 05/19/16 1510  INR 1.36    Cardiac Enzymes:  Recent Labs Lab 05/19/16 1551 05/19/16 2130 05/20/16 0438 05/20/16 1030  TROPONINI <0.03 <0.03 <0.03 <0.03    CBG:  Recent Labs Lab 05/19/16 1518 05/19/16 1646 05/19/16 2031  GLUCAP 105* 106* 117*    Recent Results (from the past 240 hour(s))  Blood Culture (routine x 2)     Status: None (Preliminary result)   Collection Time: 05/19/16  3:51 PM  Result Value Ref Range Status   Specimen Description BLOOD SITE NOT SPECIFIED  Final   Special Requests BOTTLES DRAWN AEROBIC AND ANAEROBIC 6CC EACH  Final   Culture NO GROWTH 4 DAYS  Final   Report Status PENDING  Incomplete  Blood Culture (routine x 2)     Status:  None (Preliminary result)   Collection Time: 05/19/16  4:04 PM  Result Value Ref Range Status   Specimen Description BLOOD LEFT ARM  Final   Special Requests BOTTLES DRAWN AEROBIC AND ANAEROBIC  [place]  Final   Culture NO GROWTH 4 DAYS  Final   Report Status PENDING  Incomplete  MRSA PCR Screening     Status: None   Collection Time: 05/19/16  7:19 PM  Result Value Ref Range Status   MRSA by PCR NEGATIVE NEGATIVE Final    Comment:        The GeneXpert MRSA Assay (FDA approved for NASAL specimens only), is one component of a comprehensive MRSA colonization surveillance program. It is not intended to diagnose MRSA infection nor to guide or monitor treatment for MRSA infections.      Scheduled Meds: . darbepoetin (ARANESP) injection - DIALYSIS  60 mcg Intravenous Q Thu-HD  . doxercalciferol  4 mcg Intravenous Q T,Th,Sa-HD  . lactulose  20 g Oral BID  . mouth rinse  15 mL Mouth Rinse BID  . midodrine  10 mg Oral TID WC  . multivitamin  1 tablet Oral QHS  . pantoprazole  20 mg Oral BID  . piperacillin-tazobactam (ZOSYN)  IV  3.375 g Intravenous Q12H  . rifaximin  550 mg Oral BID  . sevelamer carbonate  1,600 mg Oral TID WC  . sucralfate  1 g Oral TID WC & HS     LOS: 4 days   Cherene Altes, MD [facility] Office  [phone] Pager - Text Page per Shea Evans as per below:  On-Call/Text Page:      [email or web address]      password [email or web address]  If 7PM-7AM, please contact night-coverage [email or web address] Password [email or web address] 05/23/2016, 9:01 AM

## 2016-05-23 NOTE — Progress Notes (Signed)
S: Eating some.  No overt GI bleeding O:BP (!) 92/41   Pulse 60   Temp 97.8 F (36.6 C) (Oral)   Resp 18   Ht 5' (1.524 m)   Wt 75 kg (165 lb 5.5 oz)   LMP 09/19/2013   SpO2 99%   BMI 32.29 kg/m   Intake/Output Summary (Last 24 hours) at 05/23/16 0742 Last data filed at 05/23/16 0248  Gross per 24 hour  Intake              791 ml  Output                0 ml  Net              791 ml   Weight change: 2.5 kg (5 lb 8.2 oz) [id number] and alert CVS:RRR 1/6 systolic M Resp:Decreased BS bases Abd:+ BS + distention from ascites, umbilical hernia Ext: 2+ edema  RUA AVF + bruit NEURO:CNI Ox3 no asterixis   . darbepoetin (ARANESP) injection - DIALYSIS  60 mcg Intravenous Q Thu-HD  . doxercalciferol  4 mcg Intravenous Q T,Th,Sa-HD  . lactulose  20 g Oral BID  . mouth rinse  15 mL Mouth Rinse BID  . midodrine  10 mg Oral TID WC  . multivitamin  1 tablet Oral QHS  . pantoprazole  20 mg Oral BID  . piperacillin-tazobactam (ZOSYN)  IV  3.375 g Intravenous Q12H  . rifaximin  550 mg Oral BID  . sevelamer carbonate  1,600 mg Oral TID WC  . sucralfate  1 g Oral TID WC & HS   No results found. BMET    Component Value Date/Time   NA 131 (L) 05/23/2016 0406   K 3.9 05/23/2016 0406   CL 96 (L) 05/23/2016 0406   CO2 21 (L) 05/23/2016 0406   GLUCOSE 125 (H) 05/23/2016 0406   BUN 80 (H) 05/23/2016 0406   CREATININE 6.69 (H) 05/23/2016 0406   CREATININE 1.46 (H) 06/13/2014 0959   CALCIUM 8.5 (L) 05/23/2016 0406   GFRNONAA 7 (L) 05/23/2016 0406   GFRAA 8 (L) 05/23/2016 0406   CBC    Component Value Date/Time   WBC 15.4 (H) 05/22/2016 2244   RBC 2.75 (L) 05/22/2016 2244   HGB 8.4 (L) 05/22/2016 2244   HCT 26.1 (L) 05/22/2016 2244   PLT 80 (L) 05/22/2016 2244   MCV 94.9 05/22/2016 2244   MCH 30.5 05/22/2016 2244   MCHC 32.2 05/22/2016 2244   RDW 20.4 (H) 05/22/2016 2244   LYMPHSABS 1.6 05/22/2016 2244   MONOABS 1.5 (H) 05/22/2016 2244   EOSABS 1.4 (H) 05/22/2016 2244   BASOSABS 0.1 05/22/2016 2244     Assessment:  1. ESRD  TTS  [facility] 2. Hyperkalemia, resolved 3. GI bleed with esophageal ulcer 4. NASH 5. Chronic Low BP on midodrine 6. Sec HPTH on hectorol  Plan: 1. HD today    Dempsey Knotek T

## 2016-05-24 ENCOUNTER — Inpatient Hospital Stay (HOSPITAL_COMMUNITY): Payer: Medicare Other

## 2016-05-24 DIAGNOSIS — K7031 Alcoholic cirrhosis of liver with ascites: Secondary | ICD-10-CM

## 2016-05-24 LAB — CULTURE, BLOOD (ROUTINE X 2)
CULTURE: NO GROWTH
Culture: NO GROWTH

## 2016-05-24 LAB — RENAL FUNCTION PANEL
ALBUMIN: 2 g/dL — AB (ref 3.5–5.0)
ANION GAP: 13 (ref 5–15)
BUN: 44 mg/dL — ABNORMAL HIGH (ref 6–20)
CALCIUM: 8.7 mg/dL — AB (ref 8.9–10.3)
CO2: 24 mmol/L (ref 22–32)
Chloride: 97 mmol/L — ABNORMAL LOW (ref 101–111)
Creatinine, Ser: 5.05 mg/dL — ABNORMAL HIGH (ref 0.44–1.00)
GFR calc non Af Amer: 10 mL/min — ABNORMAL LOW (ref >60)
GFR, EST AFRICAN AMERICAN: 11 mL/min — AB (ref >60)
GLUCOSE: 100 mg/dL — AB (ref 65–99)
PHOSPHORUS: 7 mg/dL — AB (ref 2.5–4.6)
POTASSIUM: 4.3 mmol/L (ref 3.5–5.1)
SODIUM: 134 mmol/L — AB (ref 135–145)

## 2016-05-24 LAB — CBC
HEMATOCRIT: 27.7 % — AB (ref 36.0–46.0)
HEMOGLOBIN: 8.7 g/dL — AB (ref 12.0–15.0)
MCH: 30.6 pg (ref 26.0–34.0)
MCHC: 31.4 g/dL (ref 30.0–36.0)
MCV: 97.5 fL (ref 78.0–100.0)
Platelets: 71 10*3/uL — ABNORMAL LOW (ref 150–400)
RBC: 2.84 MIL/uL — AB (ref 3.87–5.11)
RDW: 21.5 % — ABNORMAL HIGH (ref 11.5–15.5)
WBC: 12.2 10*3/uL — ABNORMAL HIGH (ref 4.0–10.5)

## 2016-05-24 LAB — MAGNESIUM: MAGNESIUM: 2.2 mg/dL (ref 1.7–2.4)

## 2016-05-24 MED ORDER — LIDOCAINE HCL (PF) 1 % IJ SOLN
INTRAMUSCULAR | Status: AC
  Filled 2016-05-24: qty 10

## 2016-05-24 MED ORDER — WHITE PETROLATUM GEL
Status: AC
  Filled 2016-05-24: qty 1

## 2016-05-24 NOTE — Progress Notes (Signed)
Wanda [name] TEAM 1 - Stepdown/ICU TEAM  Wanda [name]  [id number] DOB: 1976-06-23 DOA: 05/19/2016 PCP: Wanda Simmer, MD    Brief Narrative:  40 year old female with history of NASH cirrhosis with UGIB and Mallory-Weiss tear 04/2016, ESRD on HD (TTF), chronic hypotension, SBP, and Bipolar disorder. She was admitted 9/20 with melena and coffee ground emesis. She had skipped several dialysis treatments and became nauseated and had dry heaves/wretching. Anemia with hemoglobin to 6.4 requiring 2 units PRBC. EGD the following day demonstrated Mallory-Weiss tear with adherent clot, and varices that remain obliterated from prior treatments. Conservative management was recommended. Bleeding had improved and labs normalized so she was discharged to home 9/23 after a therapeutic paracentesis.  10/10 she presented to [facility] ED with complaints of weakness and nausea x 2 days with some dark stools. She was hypotensive in the ED and iSTAT lactic acid was 5.3.  Hemoglobin was 3.9. Due to critical illness Wanda [name] was transported to [facility] for ICU admission.   Significant Events: 9/20-9/23 admit to [facility] for mallory weiss tear, clotted on EGD and managed conservatively.  10/10 - admit with severe anemia w/ GI Bleeding  10/12 EGD > non-bleeding distal esophageal ulcer   Subjective: The pt has no new complaints this morning.  She has eaten a full breakfast w/o diffculty.  She c/o ongoing abdom discomfort, but denies cp, sob, or vomiting.    Assessment & Plan:  Upper Gastrointestinal Bleed Recent Mallory-Weiss tear - Esophageal ulcer on EGD 10/12 - did not take PPI after recent d/c - cont PPI + carafate - no evidence of active bleeding at this time   Acute blood loss anemia w/ anemia of chronic disease S/P 5u PRBC on 10/11 & 1u 10/12 - Hgb stable   Hemorrhagic Shock - Chronic Hypotension  Resolved with volume and blood products - BP remains low but appears to be near baseline   NASH  Cirrhosis - Hx of SBP and Varices s/p banding To have paracentesis in radiology today - if BP stable post-procedure will plan for transfer to medical bed on renal unit   Protein-Calorie Malnutrition Encouraged pt to improve intake - she ate her entire breakfast tray this morning   Thrombocytopenia Stable - due to liver disease   Acute Hypoxic Respiratory Failure Likely secondary to volume - resolved     ESRD on HD (TTF) Nephrology following - for extra HD in AM    Hyponatremia Chronic in setting of liver disease - improved post HD   Acute Encephalopathy - Multfactorial Essentially resolved - likely due to anemia & hypotension  DVT prophylaxis: SCDs Code Status: FULL CODE Family Communication: no family present at time of exam  Disposition Plan: transfer to renal med bed if BP stable post paracentesis    Consultants:  PCCM Nephrology [place] [facility]  Antimicrobials:  Ceftaz 10/10 Vancomycin 10/10 > 10/11 Zosyn 10/10 > 10/13 Rocephin 10/14 >  Objective: Blood pressure (!) 93/44, pulse 69, temperature 97.5 F (36.4 C), temperature source Oral, resp. rate (!) 9, height 5' (1.524 m), weight 73.4 kg (161 lb 13.1 oz), last menstrual period 09/19/2013, SpO2 100 %.  Intake/Output Summary (Last 24 hours) at 05/24/16 0928 Last data filed at 05/24/16 0000  Gross per 24 hour  Intake              410 ml  Output             2000 ml  Net            -  1590 ml   Filed Weights   05/23/16 1310 05/23/16 1640 05/24/16 0500  Weight: 76.9 kg (169 lb 8.5 oz) 74.9 kg (165 lb 2 oz) 73.4 kg (161 lb 13.1 oz)    Examination: General: No acute respiratory distress Lungs: Clear to auscultation bilaterally - no wheezes or crackles Cardiovascular: Regular rate and rhythm without murmur  Abdomen: distended, umbilical hernia protuberant but w/o evidence of incarcaration, diffusely tender across abdom, no rebound  Extremities: No significant cyanosis, or clubbing - 2+ edema bilateral lower  extremities persists  CBC:  Recent Labs Lab 05/21/16 0118 05/21/16 1030 05/21/16 2118 05/22/16 1000 05/22/16 2244 05/24/16 0500  WBC 16.6* 14.2* 14.8* 14.4* 15.4* 12.2*  NEUTROABS 12.2* 10.7* 11.2* 10.2* 10.9*  --   HGB 7.1* 6.7* 7.8* 8.1* 8.4* 8.7*  HCT 20.1* 19.6* 23.3* 24.3* 26.1* 27.7*  MCV 88.2 91.6 92.1 93.1 94.9 97.5  PLT 89* 75* 64* 67* 80* 71*   Basic Metabolic Panel:  Recent Labs Lab 05/20/16 1630 05/21/16 0450 05/22/16 0457 05/23/16 0406 05/24/16 0500  NA 133* 131* 134* 131* 134*  K 4.8 5.8* 4.1 3.9 4.3  CL 99* 96* 99* 96* 97*  CO2 20* 20* 23 21* 24  GLUCOSE 130* 105* 98 125* 100*  BUN 107* 129* 68* 80* 44*  CREATININE 6.77* 7.73* 5.48* 6.69* 5.05*  CALCIUM 8.1* 8.0* 8.1* 8.5* 8.7*  MG 2.2 2.4 2.2 2.2 2.2  PHOS 7.2* 8.8* 6.8* 7.7* 7.0*   GFR: Estimated Creatinine Clearance: 13 mL/min (by C-G formula based on SCr of 5.05 mg/dL (H)).  Liver Function Tests:  Recent Labs Lab 05/19/16 1510 05/20/16 1630 05/21/16 0450 05/22/16 0457 05/23/16 0406 05/24/16 0500  AST 65*  --  100*  --   --   --   ALT 32  --  42  --   --   --   ALKPHOS 93  --  60  --   --   --   BILITOT 1.2  --  2.1*  --   --   --   PROT 4.4*  --  3.7*  --   --   --   ALBUMIN 2.0* 1.7* 1.7* 1.7* 1.8* 2.0*    Recent Labs Lab 05/19/16 1510  LIPASE 152*    Recent Labs Lab 05/19/16 1510  AMMONIA 34    Coagulation Profile:  Recent Labs Lab 05/19/16 1510  INR 1.36    Cardiac Enzymes:  Recent Labs Lab 05/19/16 1551 05/19/16 2130 05/20/16 0438 05/20/16 1030  TROPONINI <0.03 <0.03 <0.03 <0.03    CBG:  Recent Labs Lab 05/19/16 1518 05/19/16 1646 05/19/16 2031  GLUCAP 105* 106* 117*    Recent Results (from the past 240 hour(s))  Blood Culture (routine x 2)     Status: None (Preliminary result)   Collection Time: 05/19/16  3:51 PM  Result Value Ref Range Status   Specimen Description BLOOD SITE NOT SPECIFIED  Final   Special Requests BOTTLES DRAWN AEROBIC  AND ANAEROBIC 6CC EACH  Final   Culture NO GROWTH 4 DAYS  Final   Report Status PENDING  Incomplete  Blood Culture (routine x 2)     Status: None (Preliminary result)   Collection Time: 05/19/16  4:04 PM  Result Value Ref Range Status   Specimen Description BLOOD LEFT ARM  Final   Special Requests BOTTLES DRAWN AEROBIC AND ANAEROBIC 6CC  Final   Culture NO GROWTH 4 DAYS  Final   Report Status PENDING  Incomplete  MRSA PCR Screening  Status: None   Collection Time: 05/19/16  7:19 PM  Result Value Ref Range Status   MRSA by PCR NEGATIVE NEGATIVE Final    Comment:        The GeneXpert MRSA Assay (FDA approved for NASAL specimens only), is one component of a comprehensive MRSA colonization surveillance program. It is not intended to diagnose MRSA infection nor to guide or monitor treatment for MRSA infections.      Scheduled Meds: . cefTRIAXone (ROCEPHIN)  IV  1 g Intravenous Q24H  . darbepoetin (ARANESP) injection - DIALYSIS  60 mcg Intravenous Q Thu-HD  . doxercalciferol  4 mcg Intravenous Q T,Th,Sa-HD  . lactulose  20 g Oral BID  . mouth rinse  15 mL Mouth Rinse BID  . midodrine  10 mg Oral TID WC  . multivitamin  1 tablet Oral QHS  . pantoprazole  20 mg Oral BID  . rifaximin  550 mg Oral BID  . sevelamer carbonate  1,600 mg Oral TID WC  . sucralfate  1 g Oral TID WC & HS  . white petrolatum         LOS: 5 days   Cherene Altes, MD [facility] Office  [phone] Pager - Text Page per Shea Evans as per below:  On-Call/Text Page:      [email or web address]      password [email or web address]  If 7PM-7AM, please contact night-coverage [email or web address] Password [email or web address] 05/24/2016, 9:28 AM

## 2016-05-24 NOTE — Progress Notes (Signed)
S: Eating a little.  No overt GI bleeding O:BP (!) 89/46   Pulse 93   Temp 98.2 F (36.8 C) (Oral)   Resp 19   Ht 5' (1.524 m)   Wt 73.4 kg (161 lb 13.1 oz)   LMP 09/19/2013   SpO2 100%   BMI 31.60 kg/m   Intake/Output Summary (Last 24 hours) at 05/24/16 0721 Last data filed at 05/24/16 0000  Gross per 24 hour  Intake              410 ml  Output             2000 ml  Net            -1590 ml   Weight change: 1.9 kg (4 lb 3 oz) [id number] and alert CVS:RRR 1/6 systolic M Resp:Decreased BS bases Abd:+ BS + distention from ascites, umbilical hernia Ext: [id number] edema  RUA AVF + bruit NEURO:CNI Ox3 no asterixis   . cefTRIAXone (ROCEPHIN)  IV  1 g Intravenous Q24H  . darbepoetin (ARANESP) injection - DIALYSIS  60 mcg Intravenous Q Thu-HD  . doxercalciferol  4 mcg Intravenous Q T,Th,Sa-HD  . lactulose  20 g Oral BID  . mouth rinse  15 mL Mouth Rinse BID  . midodrine  10 mg Oral TID WC  . multivitamin  1 tablet Oral QHS  . pantoprazole  20 mg Oral BID  . rifaximin  550 mg Oral BID  . sevelamer carbonate  1,600 mg Oral TID WC  . sucralfate  1 g Oral TID WC & HS  . white petrolatum       No results found. BMET    Component Value Date/Time   NA 134 (L) 05/24/2016 0500   K 4.3 05/24/2016 0500   CL 97 (L) 05/24/2016 0500   CO2 24 05/24/2016 0500   GLUCOSE 100 (H) 05/24/2016 0500   BUN 44 (H) 05/24/2016 0500   CREATININE 5.05 (H) 05/24/2016 0500   CREATININE 1.46 (H) 06/13/2014 0959   CALCIUM 8.7 (L) 05/24/2016 0500   GFRNONAA 10 (L) 05/24/2016 0500   GFRAA 11 (L) 05/24/2016 0500   CBC    Component Value Date/Time   WBC 12.2 (H) 05/24/2016 0500   RBC 2.84 (L) 05/24/2016 0500   HGB 8.7 (L) 05/24/2016 0500   HCT 27.7 (L) 05/24/2016 0500   PLT 71 (L) 05/24/2016 0500   MCV 97.5 05/24/2016 0500   MCH 30.6 05/24/2016 0500   MCHC 31.4 05/24/2016 0500   RDW 21.5 (H) 05/24/2016 0500   LYMPHSABS 1.6 05/22/2016 2244   MONOABS 1.5 (H) 05/22/2016 2244   EOSABS 1.4 (H)  05/22/2016 2244   BASOSABS 0.1 05/22/2016 2244     Assessment:  1. ESRD  TTS  [facility] 2. Hyperkalemia, resolved 3. GI bleed with esophageal ulcer 4. NASH 5. Chronic Low BP on midodrine 6. Sec HPTH on hectorol  Plan: 1. Will do extra tx tomorrow to see if can pull more fluid.  Her BP remains a limiting factor in fluid removal    Pati Thinnes T

## 2016-05-24 NOTE — Progress Notes (Signed)
Pt transferred to 6N13 without incident.  Bedside handoff communication to Hosie Spangle, Therapist, sports.

## 2016-05-24 NOTE — Procedures (Signed)
Successful US guided paracentesis from right lateral abdomen.  Yielded 5 liters of clear yellow fluid.  No immediate complications.  Pt tolerated well.   Kameryn Tisdel S Ashawna Hanback PA-C 05/24/2016 11:33 AM

## 2016-05-25 LAB — CBC
HCT: 24.7 % — ABNORMAL LOW (ref 36.0–46.0)
Hemoglobin: 7.8 g/dL — ABNORMAL LOW (ref 12.0–15.0)
MCH: 30.8 pg (ref 26.0–34.0)
MCHC: 31.6 g/dL (ref 30.0–36.0)
MCV: 97.6 fL (ref 78.0–100.0)
PLATELETS: 69 10*3/uL — AB (ref 150–400)
RBC: 2.53 MIL/uL — AB (ref 3.87–5.11)
RDW: 20.4 % — ABNORMAL HIGH (ref 11.5–15.5)
WBC: 8.5 10*3/uL (ref 4.0–10.5)

## 2016-05-25 LAB — RENAL FUNCTION PANEL
ALBUMIN: 1.9 g/dL — AB (ref 3.5–5.0)
ANION GAP: 15 (ref 5–15)
BUN: 55 mg/dL — ABNORMAL HIGH (ref 6–20)
CALCIUM: 8.7 mg/dL — AB (ref 8.9–10.3)
CO2: 23 mmol/L (ref 22–32)
CREATININE: 6.7 mg/dL — AB (ref 0.44–1.00)
Chloride: 95 mmol/L — ABNORMAL LOW (ref 101–111)
GFR calc Af Amer: 8 mL/min — ABNORMAL LOW (ref >60)
GFR, EST NON AFRICAN AMERICAN: 7 mL/min — AB (ref >60)
Glucose, Bld: 109 mg/dL — ABNORMAL HIGH (ref 65–99)
POTASSIUM: 4 mmol/L (ref 3.5–5.1)
Phosphorus: 8.6 mg/dL — ABNORMAL HIGH (ref 2.5–4.6)
Sodium: 133 mmol/L — ABNORMAL LOW (ref 135–145)

## 2016-05-25 MED ORDER — FENTANYL CITRATE (PF) 100 MCG/2ML IJ SOLN
INTRAMUSCULAR | Status: AC
  Administered 2016-05-25: 12.5 ug via INTRAVENOUS
  Filled 2016-05-25: qty 2

## 2016-05-25 MED ORDER — ONDANSETRON HCL 4 MG/2ML IJ SOLN
INTRAMUSCULAR | Status: AC
  Administered 2016-05-25: 4 mg via INTRAVENOUS
  Filled 2016-05-25: qty 2

## 2016-05-25 MED ORDER — SODIUM CHLORIDE 0.9% FLUSH
10.0000 mL | INTRAVENOUS | Status: DC | PRN
  Administered 2016-05-27: 10 mL
  Filled 2016-05-25: qty 40

## 2016-05-25 MED ORDER — OXYCODONE HCL 5 MG PO TABS
ORAL_TABLET | ORAL | Status: AC
  Filled 2016-05-25: qty 2

## 2016-05-25 NOTE — Procedures (Signed)
I have seen and examined this patient and agree with the plan of care   Patient seen in dialysis she appears to be doing well and fairly stable  She has been having IDH and limiting fluid removal   GI bleed  Hb 7.8   Cirrhosis  Platelets 69   PTT 31   INR  1.36    Alb 1.9                        Lezlie Ritchey W 05/25/2016, 3:51 PM

## 2016-05-25 NOTE — Care Management Important Message (Signed)
Important Message  Patient Details  Name: Wanda [name] MRN: [id number] Date of Birth: [birth year]   Medicare Important Message Given:  Yes    Nathen May 05/25/2016, 2:08 PM

## 2016-05-25 NOTE — Progress Notes (Addendum)
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 She was admitted 9/20 with melena and coffee ground emesis. She had skipped several dialysis treatments and became nauseated and had dry heaves/wretching. Anemia with hemoglobin to 6.4 requiring 2 units PRBC. EGD the following day demonstrated Mallory-Weiss tear with adherent clot, and varices that remain obliterated from prior treatments. Conservative management was recommended. Bleeding had improved and labs normalized so she was discharged to home 9/23 after a therapeutic paracentesis.  10/10 she presented to [facility] ED with complaints of weakness and nausea x 2 days with some dark stools. She was hypotensive in the ED and iSTAT lactic acid was 5.3.  Hemoglobin was 3.9. Due to critical illness Wanda [name] was transported to [facility] for ICU admission.   Significant Events: 9/20-9/23 admit to [facility] for mallory weiss tear, clotted on EGD and managed conservatively.  10/10 - admit with severe anemia w/ GI Bleeding  10/12 EGD > non-bleeding distal esophageal ulcer   Subjective: Asking if she has urine infection, explain to her that there was not urine culture collected.  She report mild abdominal pain.  Denies hematemesis. Stool getting lighter.   Assessment & Plan:  Upper Gastrointestinal Bleed Recent Mallory-Weiss tear - Esophageal ulcer on EGD 10/12 - did not take PPI after recent d/c - cont PPI + carafate - no evidence of active bleeding at this time  Follow hb. Repeat in am.   Acute blood loss anemia w/ anemia of chronic disease S/P 5u PRBC on 10/11 & 1u 10/12 -  Fu hb in am.   Hemorrhagic Shock - Chronic Hypotension  Resolved with volume and blood products - BP remains low but appears to be near baseline     NASH Cirrhosis - Hx of SBP and Varices s/p banding Underwent paracentesis 10-15.   Protein-Calorie Malnutrition Encouraged pt to improve intake - she ate her entire breakfast tray this morning   Thrombocytopenia Stable - due to liver disease   Acute Hypoxic Respiratory Failure Likely secondary to volume - resolved     ESRD on HD (TTF) Nephrology following - for extra HD today.   Hyponatremia Chronic in setting of liver disease - improved post HD   Acute Encephalopathy - Multfactorial Essentially resolved - likely due to anemia & hypotension  DVT prophylaxis: SCDs Code Status: FULL CODE Family Communication: no family present at time of exam  Disposition Plan: HD today.   Consultants:  PCCM Nephrology [place] [facility]  Antimicrobials:  Ceftaz 10/10 Vancomycin 10/10 > 10/11 Zosyn 10/10 > 10/13 Rocephin 10/14 >  Objective: Blood pressure (!) 97/43, pulse 81, temperature 98.1 F (36.7 C), temperature source Oral, resp. rate 16, height 5' (1.524 m), weight 73.6 kg (162 lb 4.1 oz), last menstrual period 09/19/2013, SpO2 100 %. No intake or output data in the 24 hours ending 05/25/16 1605 Filed Weights   05/24/16 0500 05/25/16 0500 05/25/16 1400  Weight: 73.4 kg (161 lb 13.1 oz) 70.6 kg (155 lb 10.3 oz) 73.6 kg (162 lb 4.1 oz)    Examination: General: No acute respiratory distress Lungs: Clear to auscultation bilaterally - no wheezes or crackles Cardiovascular: Regular rate and rhythm without murmur  Abdomen: distended, umbilical hernia protuberant but w/o evidence of incarcaration, diffusely tender across abdom, no rebound  Extremities: No  significant cyanosis, or clubbing - 2+ edema bilateral lower extremities persists  CBC:  Recent Labs Lab 05/21/16 0118 05/21/16 1030 05/21/16 2118 05/22/16 1000 05/22/16 2244 05/24/16 0500 05/25/16 0643  WBC 16.6* 14.2* 14.8* 14.4* 15.4* 12.2* 8.5  NEUTROABS 12.2* 10.7* 11.2* 10.2* 10.9*  --   --   HGB 7.1* 6.7* 7.8* 8.1*  8.4* 8.7* 7.8*  HCT 20.1* 19.6* 23.3* 24.3* 26.1* 27.7* 24.7*  MCV 88.2 91.6 92.1 93.1 94.9 97.5 97.6  PLT 89* 75* 64* 67* 80* 71* 69*   Basic Metabolic Panel:  Recent Labs Lab 05/20/16 1630 05/21/16 0450 05/22/16 0457 05/23/16 0406 05/24/16 0500 05/25/16 0643  NA 133* 131* 134* 131* 134* 133*  K 4.8 5.8* 4.1 3.9 4.3 4.0  CL 99* 96* 99* 96* 97* 95*  CO2 20* 20* 23 21* 24 23  GLUCOSE 130* 105* 98 125* 100* 109*  BUN 107* 129* 68* 80* 44* 55*  CREATININE 6.77* 7.73* 5.48* 6.69* 5.05* 6.70*  CALCIUM 8.1* 8.0* 8.1* 8.5* 8.7* 8.7*  MG 2.2 2.4 2.2 2.2 2.2  --   PHOS 7.2* 8.8* 6.8* 7.7* 7.0* 8.6*   GFR: Estimated Creatinine Clearance: 9.8 mL/min (by C-G formula based on SCr of 6.7 mg/dL (H)).  Liver Function Tests:  Recent Labs Lab 05/19/16 1510  05/21/16 0450 05/22/16 0457 05/23/16 0406 05/24/16 0500 05/25/16 0643  AST 65*  --  100*  --   --   --   --   ALT 32  --  42  --   --   --   --   ALKPHOS 93  --  60  --   --   --   --   BILITOT 1.2  --  2.1*  --   --   --   --   PROT 4.4*  --  3.7*  --   --   --   --   ALBUMIN 2.0*  < > 1.7* 1.7* 1.8* 2.0* 1.9*  < > = values in this interval not displayed.  Recent Labs Lab 05/19/16 1510  LIPASE 152*    Recent Labs Lab 05/19/16 1510  AMMONIA 34    Coagulation Profile:  Recent Labs Lab 05/19/16 1510  INR 1.36    Cardiac Enzymes:  Recent Labs Lab 05/19/16 1551 05/19/16 2130 05/20/16 0438 05/20/16 1030  TROPONINI <0.03 <0.03 <0.03 <0.03    CBG:  Recent Labs Lab 05/19/16 1518 05/19/16 1646 05/19/16 2031  GLUCAP 105* 106* 117*    Recent Results (from the past 240 hour(s))  Blood Culture (routine x 2)     Status: None   Collection Time: 05/19/16  3:51 PM  Result Value Ref Range Status   Specimen Description BLOOD SITE NOT SPECIFIED  Final   Special Requests BOTTLES DRAWN AEROBIC AND ANAEROBIC [place]  Final   Culture NO GROWTH 5 DAYS  Final   Report Status 05/24/2016 FINAL  Final  Blood  Culture (routine x 2)     Status: None   Collection Time: 05/19/16  4:04 PM  Result Value Ref Range Status   Specimen Description BLOOD LEFT ARM  Final   Special Requests BOTTLES DRAWN AEROBIC AND ANAEROBIC 6CC  Final   Culture NO GROWTH 5 DAYS  Final   Report Status 05/24/2016 FINAL  Final  MRSA PCR Screening     Status: None   Collection Time: 05/19/16  7:19 PM  Result Value Ref Range Status   MRSA by PCR NEGATIVE NEGATIVE Final  Comment:        The GeneXpert MRSA Assay (FDA approved for NASAL specimens only), is one component of a comprehensive MRSA colonization surveillance program. It is not intended to diagnose MRSA infection nor to guide or monitor treatment for MRSA infections.      Scheduled Meds: . cefTRIAXone (ROCEPHIN)  IV  1 g Intravenous Q24H  . darbepoetin (ARANESP) injection - DIALYSIS  60 mcg Intravenous Q Thu-HD  . doxercalciferol  4 mcg Intravenous Q T,Th,Sa-HD  . lactulose  20 g Oral BID  . mouth rinse  15 mL Mouth Rinse BID  . midodrine  10 mg Oral TID WC  . multivitamin  1 tablet Oral QHS  . pantoprazole  20 mg Oral BID  . rifaximin  550 mg Oral BID  . sevelamer carbonate  1,600 mg Oral TID WC  . sucralfate  1 g Oral TID WC & HS     LOS: 6 days   Quindon Denker. MD [phone] [facility] Office  [phone] Pager - Text Page per Amion as per below:  On-Call/Text Page:      [email or web address]      password [email or web address]  If 7PM-7AM, please contact night-coverage [email or web address] Password [email or web address] 05/25/2016, 4:05 PM

## 2016-05-26 LAB — RENAL FUNCTION PANEL
ALBUMIN: 2 g/dL — AB (ref 3.5–5.0)
ANION GAP: 12 (ref 5–15)
BUN: 32 mg/dL — ABNORMAL HIGH (ref 6–20)
CO2: 26 mmol/L (ref 22–32)
Calcium: 8.6 mg/dL — ABNORMAL LOW (ref 8.9–10.3)
Chloride: 97 mmol/L — ABNORMAL LOW (ref 101–111)
Creatinine, Ser: 5.08 mg/dL — ABNORMAL HIGH (ref 0.44–1.00)
GFR calc Af Amer: 11 mL/min — ABNORMAL LOW (ref >60)
GFR calc non Af Amer: 10 mL/min — ABNORMAL LOW (ref >60)
GLUCOSE: 114 mg/dL — AB (ref 65–99)
PHOSPHORUS: 6.8 mg/dL — AB (ref 2.5–4.6)
POTASSIUM: 3.2 mmol/L — AB (ref 3.5–5.1)
Sodium: 135 mmol/L (ref 135–145)

## 2016-05-26 LAB — CBC
HEMATOCRIT: 25 % — AB (ref 36.0–46.0)
HEMOGLOBIN: 8 g/dL — AB (ref 12.0–15.0)
MCH: 31.5 pg (ref 26.0–34.0)
MCHC: 32 g/dL (ref 30.0–36.0)
MCV: 98.4 fL (ref 78.0–100.0)
Platelets: 52 10*3/uL — ABNORMAL LOW (ref 150–400)
RBC: 2.54 MIL/uL — ABNORMAL LOW (ref 3.87–5.11)
RDW: 20.2 % — ABNORMAL HIGH (ref 11.5–15.5)
WBC: 7.1 10*3/uL (ref 4.0–10.5)

## 2016-05-26 LAB — HEPATITIS B SURFACE ANTIBODY,QUALITATIVE: Hep B S Ab: REACTIVE

## 2016-05-26 MED ORDER — PENTAFLUOROPROP-TETRAFLUOROETH EX AERO
1.0000 "application " | INHALATION_SPRAY | CUTANEOUS | Status: DC | PRN
  Administered 2016-05-26: 1 via TOPICAL

## 2016-05-26 MED ORDER — LIDOCAINE-PRILOCAINE 2.5-2.5 % EX CREA
1.0000 | TOPICAL_CREAM | CUTANEOUS | Status: DC | PRN
Start: 2016-05-26 — End: 2016-05-27

## 2016-05-26 MED ORDER — PENTAFLUOROPROP-TETRAFLUOROETH EX AERO
INHALATION_SPRAY | CUTANEOUS | Status: AC
  Administered 2016-05-26: 1 via TOPICAL
  Filled 2016-05-26: qty 103.5

## 2016-05-26 MED ORDER — ALTEPLASE 2 MG IJ SOLR: 2.0000 mg | Freq: Once | INTRAMUSCULAR | Status: DC | PRN

## 2016-05-26 MED ORDER — SODIUM CHLORIDE 0.9 % IV SOLN: 100.0000 mL | INTRAVENOUS | Status: DC | PRN

## 2016-05-26 MED ORDER — HEPARIN SODIUM (PORCINE) 1000 UNIT/ML DIALYSIS: 1000.0000 [IU] | INTRAMUSCULAR | Status: DC | PRN

## 2016-05-26 MED ORDER — FENTANYL CITRATE (PF) 100 MCG/2ML IJ SOLN
INTRAMUSCULAR | Status: AC
  Administered 2016-05-26: 12.5 ug via INTRAVENOUS
  Filled 2016-05-26: qty 2

## 2016-05-26 MED ORDER — LIDOCAINE HCL (PF) 1 % IJ SOLN: 5.0000 mL | INTRAMUSCULAR | Status: DC | PRN

## 2016-05-26 MED ORDER — DOXERCALCIFEROL 4 MCG/2ML IV SOLN
INTRAVENOUS | Status: AC
  Administered 2016-05-26: 4 ug via INTRAVENOUS
  Filled 2016-05-26: qty 2

## 2016-05-26 MED ORDER — OXYCODONE HCL 5 MG PO TABS
ORAL_TABLET | ORAL | Status: AC
  Filled 2016-05-26: qty 2

## 2016-05-26 NOTE — Progress Notes (Signed)
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 She was admitted 9/20 with melena and coffee ground emesis. She had skipped several dialysis treatments and became nauseated and had dry heaves/wretching. Anemia with hemoglobin to 6.4 requiring 2 units PRBC. EGD the following day demonstrated Mallory-Weiss tear with adherent clot, and varices that remain obliterated from prior treatments. Conservative management was recommended. Bleeding had improved and labs normalized so she was discharged to home 9/23 after a therapeutic paracentesis.  10/10 she presented to [facility] ED with complaints of weakness and nausea x 2 days with some dark stools. She was hypotensive in the ED and iSTAT lactic acid was 5.3.  Hemoglobin was 3.9. Due to critical illness Wanda [name] was transported to [facility] for ICU admission.   Significant Events: 9/20-9/23 admit to [facility] for mallory weiss tear, clotted on EGD and managed conservatively.  10/10 - admit with severe anemia w/ GI Bleeding  10/12 EGD > non-bleeding distal esophageal ulcer   Subjective: Still with edema legs. No hematemesis.   Assessment & Plan:  Upper Gastrointestinal Bleed Recent Mallory-Weiss tear - Esophageal ulcer on EGD 10/12 - did not take PPI after recent d/c - cont PPI + carafate - no evidence of active bleeding at this time  Hb stable.   Acute blood loss anemia w/ anemia of chronic disease S/P 5u PRBC on 10/11 & 1u 10/12 -  Hb stable.   Hemorrhagic Shock - Chronic Hypotension  Resolved with volume and blood products - BP remains low but appears to be near baseline   NASH Cirrhosis - Hx of SBP and Varices s/p banding Underwent paracentesis 10-15.   Protein-Calorie Malnutrition Encouraged pt to improve intake -  she ate her entire breakfast tray this morning   Thrombocytopenia Stable - due to liver disease   Acute Hypoxic Respiratory Failure Likely secondary to volume - resolved     ESRD on HD (TTF) Nephrology following - had extra HD 10-16 Plan for HD today   Hyponatremia Chronic in setting of liver disease - improved post HD   Acute Encephalopathy - Multfactorial Essentially resolved - likely due to anemia & hypotension  DVT prophylaxis: SCDs Code Status: FULL CODE Family Communication: no family present at time of exam  Disposition Plan: HD today.   Consultants:  PCCM Nephrology [place] [facility]  Antimicrobials:  Ceftaz 10/10 Vancomycin 10/10 > 10/11 Zosyn 10/10 > 10/13 Rocephin 10/14 >  Objective: Blood pressure (!) 94/44, pulse 86, temperature 98.6 F (37 C), temperature source Oral, resp. rate 17, height 5' (1.524 m), weight 71 kg (156 lb 8.4 oz), last menstrual period 09/19/2013, SpO2 100 %.  Intake/Output Summary (Last 24 hours) at 05/26/16 1247 Last data filed at 05/26/16 1006  Gross per 24 hour  Intake              420 ml  Output             2501 ml  Net            -2081 ml   Filed Weights   05/25/16 0500 05/25/16 1400 05/25/16 1820  Weight: 70.6 kg (155 lb 10.3 oz) 73.6 kg (162 lb 4.1 oz) 71 kg (156 lb 8.4 oz)    Examination: General: No acute respiratory distress Lungs: Clear to  auscultation bilaterally - no wheezes or crackles Cardiovascular: Regular rate and rhythm without murmur  Abdomen: distended, umbilical hernia protuberant but w/o evidence of incarcaration, diffusely tender across abdom, no rebound  Extremities: No significant cyanosis, or clubbing - 2+ edema bilateral lower extremities persists  CBC:  Recent Labs Lab 05/21/16 0118 05/21/16 1030 05/21/16 2118 05/22/16 1000 05/22/16 2244 05/24/16 0500 05/25/16 0643 05/26/16 0434  WBC 16.6* 14.2* 14.8* 14.4* 15.4* 12.2* 8.5 7.1  NEUTROABS 12.2* 10.7* 11.2* 10.2* 10.9*  --   --   --   HGB  7.1* 6.7* 7.8* 8.1* 8.4* 8.7* 7.8* 8.0*  HCT 20.1* 19.6* 23.3* 24.3* 26.1* 27.7* 24.7* 25.0*  MCV 88.2 91.6 92.1 93.1 94.9 97.5 97.6 98.4  PLT 89* 75* 64* 67* 80* 71* 69* 52*   Basic Metabolic Panel:  Recent Labs Lab 05/20/16 1630 05/21/16 0450 05/22/16 0457 05/23/16 0406 05/24/16 0500 05/25/16 0643 05/26/16 0434  NA 133* 131* 134* 131* 134* 133* 135  K 4.8 5.8* 4.1 3.9 4.3 4.0 3.2*  CL 99* 96* 99* 96* 97* 95* 97*  CO2 20* 20* 23 21* 24 23 26   GLUCOSE 130* 105* 98 125* 100* 109* 114*  BUN 107* 129* 68* 80* 44* 55* 32*  CREATININE 6.77* 7.73* 5.48* 6.69* 5.05* 6.70* 5.08*  CALCIUM 8.1* 8.0* 8.1* 8.5* 8.7* 8.7* 8.6*  MG 2.2 2.4 2.2 2.2 2.2  --   --   PHOS 7.2* 8.8* 6.8* 7.7* 7.0* 8.6* 6.8*   GFR: Estimated Creatinine Clearance: 12.7 mL/min (by C-G formula based on SCr of 5.08 mg/dL (H)).  Liver Function Tests:  Recent Labs Lab 05/19/16 1510  05/21/16 0450 05/22/16 0457 05/23/16 0406 05/24/16 0500 05/25/16 0643 05/26/16 0434  AST 65*  --  100*  --   --   --   --   --   ALT 32  --  42  --   --   --   --   --   ALKPHOS 93  --  60  --   --   --   --   --   BILITOT 1.2  --  2.1*  --   --   --   --   --   PROT 4.4*  --  3.7*  --   --   --   --   --   ALBUMIN 2.0*  < > 1.7* 1.7* 1.8* 2.0* 1.9* 2.0*  < > = values in this interval not displayed.  Recent Labs Lab 05/19/16 1510  LIPASE 152*    Recent Labs Lab 05/19/16 1510  AMMONIA 34    Coagulation Profile:  Recent Labs Lab 05/19/16 1510  INR 1.36    Cardiac Enzymes:  Recent Labs Lab 05/19/16 1551 05/19/16 2130 05/20/16 0438 05/20/16 1030  TROPONINI <0.03 <0.03 <0.03 <0.03    CBG:  Recent Labs Lab 05/19/16 1518 05/19/16 1646 05/19/16 2031  GLUCAP 105* 106* 117*    Recent Results (from the past 240 hour(s))  Blood Culture (routine x 2)     Status: None   Collection Time: 05/19/16  3:51 PM  Result Value Ref Range Status   Specimen Description BLOOD SITE NOT SPECIFIED  Final   Special  Requests BOTTLES DRAWN AEROBIC AND ANAEROBIC [place]  Final   Culture NO GROWTH 5 DAYS  Final   Report Status 05/24/2016 FINAL  Final  Blood Culture (routine x 2)     Status: None   Collection Time: 05/19/16  4:04 PM  Result Value  Ref Range Status   Specimen Description BLOOD LEFT ARM  Final   Special Requests BOTTLES DRAWN AEROBIC AND ANAEROBIC 6CC  Final   Culture NO GROWTH 5 DAYS  Final   Report Status 05/24/2016 FINAL  Final  MRSA PCR Screening     Status: None   Collection Time: 05/19/16  7:19 PM  Result Value Ref Range Status   MRSA by PCR NEGATIVE NEGATIVE Final    Comment:        The GeneXpert MRSA Assay (FDA approved for NASAL specimens only), is one component of a comprehensive MRSA colonization surveillance program. It is not intended to diagnose MRSA infection nor to guide or monitor treatment for MRSA infections.      Scheduled Meds: . cefTRIAXone (ROCEPHIN)  IV  1 g Intravenous Q24H  . darbepoetin (ARANESP) injection - DIALYSIS  60 mcg Intravenous Q Thu-HD  . doxercalciferol  4 mcg Intravenous Q T,Th,Sa-HD  . lactulose  20 g Oral BID  . mouth rinse  15 mL Mouth Rinse BID  . midodrine  10 mg Oral TID WC  . multivitamin  1 tablet Oral QHS  . pantoprazole  20 mg Oral BID  . rifaximin  550 mg Oral BID  . sevelamer carbonate  1,600 mg Oral TID WC  . sucralfate  1 g Oral TID WC & HS     LOS: 7 days   Burkley Dech. MD [phone] [facility] Office  [phone] Pager - Text Page per Amion as per below:  On-Call/Text Page:      [email or web address]      password [email or web address]  If 7PM-7AM, please contact night-coverage [email or web address] Password [email or web address] 05/26/2016, 12:47 PM

## 2016-05-26 NOTE — Progress Notes (Signed)
Physical Therapy Treatment Patient Details Name: Wanda [name] MRN: [id number] DOB: 22-Jun-1976 Today's Date: 05/26/2016    History of Present Illness 40 yo admitted with sepsis and recurrent GIB. PMHx: cirrhosis secondary to NASH, ESRD, hypotension, bipolar, recent admit Sept 2017 with GIB and paracentesis    PT Comments    Pt progressing, improved ability to follow commands and participate today; encouraged LE movement throughout the day, continues to have significant edema bil LEs; will continue to follow  Follow Up Recommendations  Home health PT     Equipment Recommendations  Rolling walker with 5" wheels    Recommendations for Other Services       Precautions / Restrictions Precautions Precautions: Fall Restrictions Weight Bearing Restrictions: No    Mobility  Bed Mobility Overal bed mobility: Needs Assistance Bed Mobility: Supine to Sit;Sit to Supine     Supine to sit: Supervision;HOB elevated Sit to supine: Min assist   General bed mobility comments: use of rail to  complete transition; assist with LEs onto bed, pt able to assist  with scooting up while  in supine  position with min assist and cues to self assist  Transfers Overall transfer level: Needs assistance Equipment used: Rolling walker (2 wheeled) Transfers: Sit to/from Stand Sit to Stand: Supervision;Min guard         General transfer comment: cues for hand placement, slightly incr time d/t LE edema/pain  Ambulation/Gait Ambulation/Gait assistance: Min guard Ambulation Distance (Feet): 120 Feet Assistive device: Rolling walker (2 wheeled) Gait Pattern/deviations: Step-through pattern;Decreased stride length;Wide base of support     General Gait Details: cues for posture and position in RW   Stairs            Wheelchair Mobility    Modified Rankin (Stroke Patients Only)       Balance                                    Cognition Arousal/Alertness:  Awake/alert Behavior During Therapy: WFL for tasks assessed/performed Overall Cognitive Status: Within Functional Limits for tasks assessed                 General Comments: pt with improved mentation today, able to follow multi-step commands with little delay in processing;     Exercises General Exercises - Lower Extremity Ankle Circles/Pumps: AROM;Both;10 reps Heel Slides: AROM;Both;5 reps;Limitations Heel Slides Limitations: c/o pain RLE and abd limiting  # of reps    General Comments        Pertinent Vitals/Pain Pain Assessment: 0-10 Pain Score: 4  Pain Location: LEs/abd Pain Descriptors / Indicators: Sore Pain Intervention(s): Limited activity within patient's tolerance;Monitored during session    Home Living                      Prior Function            PT Goals (current goals can now be found in the care plan section) Acute Rehab PT Goals Patient Stated Goal: return home PT Goal Formulation: With patient Time For Goal Achievement: 06/05/16 Potential to Achieve Goals: Fair Progress towards PT goals: Progressing toward goals    Frequency    Min 3X/week      PT Plan Current plan remains appropriate    Co-evaluation             End of Session Equipment Utilized During Treatment: Gait belt Activity Tolerance:  Patient tolerated treatment well Patient left: in bed;with call bell/phone within reach     Time: 1126-1140 PT Time Calculation (min) (ACUTE ONLY): 14 min  Charges:  $Gait Training: 8-22 mins                    G Codes:      Muaz Shorey 06-19-2016, 11:53 AM

## 2016-05-26 NOTE — Progress Notes (Signed)
[facility] nurse arrived to floor to assess vascular access devises pt was unavailable both times. Last time it was reported pt was off unit for dialysis. Fran Lowes, RN [facility]

## 2016-05-26 NOTE — Progress Notes (Signed)
[facility] ROUNDING NOTE   Subjective:   Interval History: no complaints today appears to be doing well   Objective:  Vital signs in last 24 hours:  Temp:  [98.1 F (36.7 C)-98.7 F (37.1 C)] 98.6 F (37 C) (10/17 0444) Pulse Rate:  [76-111] 86 (10/17 0444) Resp:  [16-18] 17 (10/17 0444) BP: (94-113)/(43-60) 94/44 (10/17 0444) SpO2:  [98 %-100 %] 100 % (10/17 0444) Weight:  [71 kg (156 lb 8.4 oz)-73.6 kg (162 lb 4.1 oz)] 71 kg (156 lb 8.4 oz) (10/16 1820)  Weight change: 3 kg (6 lb 9.8 oz) Filed Weights   05/25/16 0500 05/25/16 1400 05/25/16 1820  Weight: 70.6 kg (155 lb 10.3 oz) 73.6 kg (162 lb 4.1 oz) 71 kg (156 lb 8.4 oz)    Intake/Output: I/O last 3 completed shifts: In: 180 [P.O.:180] Out: 2500 [Other:2500]   Intake/Output this shift:  Total I/O In: 240 [P.O.:240] Out: 1 [Urine:1]  Weight change: 1.9 kg (4 lb 3 oz) [id number] and alert CVS:RRR 1/6 systolic M Resp:Decreased BS bases Abd:+ BS + distention from ascites, umbilical hernia Ext: [id number] edema  RUA AVF + bruit NEURO:CNI Ox3 no asterixis   Basic Metabolic Panel:  Recent Labs Lab 05/20/16 1630 05/21/16 0450 05/22/16 0457 05/23/16 0406 05/24/16 0500 05/25/16 0643 05/26/16 0434  NA 133* 131* 134* 131* 134* 133* 135  K 4.8 5.8* 4.1 3.9 4.3 4.0 3.2*  CL 99* 96* 99* 96* 97* 95* 97*  CO2 20* 20* 23 21* 24 23 26   GLUCOSE 130* 105* 98 125* 100* 109* 114*  BUN 107* 129* 68* 80* 44* 55* 32*  CREATININE 6.77* 7.73* 5.48* 6.69* 5.05* 6.70* 5.08*  CALCIUM 8.1* 8.0* 8.1* 8.5* 8.7* 8.7* 8.6*  MG 2.2 2.4 2.2 2.2 2.2  --   --   PHOS 7.2* 8.8* 6.8* 7.7* 7.0* 8.6* 6.8*    Liver Function Tests:  Recent Labs Lab 05/19/16 1510  05/21/16 0450 05/22/16 0457 05/23/16 0406 05/24/16 0500 05/25/16 0643 05/26/16 0434  AST 65*  --  100*  --   --   --   --   --   ALT 32  --  42  --   --   --   --   --   ALKPHOS 93  --  60  --   --   --   --   --   BILITOT 1.2  --  2.1*  --   --   --   --   --    PROT 4.4*  --  3.7*  --   --   --   --   --   ALBUMIN 2.0*  < > 1.7* 1.7* 1.8* 2.0* 1.9* 2.0*  < > = values in this interval not displayed.  Recent Labs Lab 05/19/16 1510  LIPASE 152*    Recent Labs Lab 05/19/16 1510  AMMONIA 34    CBC:  Recent Labs Lab 05/21/16 0118 05/21/16 1030 05/21/16 2118 05/22/16 1000 05/22/16 2244 05/24/16 0500 05/25/16 0643 05/26/16 0434  WBC 16.6* 14.2* 14.8* 14.4* 15.4* 12.2* 8.5 7.1  NEUTROABS 12.2* 10.7* 11.2* 10.2* 10.9*  --   --   --   HGB 7.1* 6.7* 7.8* 8.1* 8.4* 8.7* 7.8* 8.0*  HCT 20.1* 19.6* 23.3* 24.3* 26.1* 27.7* 24.7* 25.0*  MCV 88.2 91.6 92.1 93.1 94.9 97.5 97.6 98.4  PLT 89* 75* 64* 67* 80* 71* 69* 52*    Cardiac Enzymes:  Recent Labs Lab 05/19/16 1551 05/19/16 2130 05/20/16  [id number] 05/20/16 1030  TROPONINI <0.03 <0.03 <0.03 <0.03    BNP: Invalid input(s): POCBNP  CBG:  Recent Labs Lab 05/19/16 1518 05/19/16 1646 05/19/16 2031  GLUCAP 105* 106* 117*    Microbiology: Results for orders placed or performed during the hospital encounter of 05/19/16  Blood Culture (routine x 2)     Status: None   Collection Time: 05/19/16  3:51 PM  Result Value Ref Range Status   Specimen Description BLOOD SITE NOT SPECIFIED  Final   Special Requests BOTTLES DRAWN AEROBIC AND ANAEROBIC [place]  Final   Culture NO GROWTH 5 DAYS  Final   Report Status 05/24/2016 FINAL  Final  Blood Culture (routine x 2)     Status: None   Collection Time: 05/19/16  4:04 PM  Result Value Ref Range Status   Specimen Description BLOOD LEFT ARM  Final   Special Requests BOTTLES DRAWN AEROBIC AND ANAEROBIC 6CC  Final   Culture NO GROWTH 5 DAYS  Final   Report Status 05/24/2016 FINAL  Final  MRSA PCR Screening     Status: None   Collection Time: 05/19/16  7:19 PM  Result Value Ref Range Status   MRSA by PCR NEGATIVE NEGATIVE Final    Comment:        The GeneXpert MRSA Assay (FDA approved for NASAL specimens only), is one component of  a comprehensive MRSA colonization surveillance program. It is not intended to diagnose MRSA infection nor to guide or monitor treatment for MRSA infections.     Coagulation Studies: No results for input(s): LABPROT, INR in the last 72 hours.  Urinalysis: No results for input(s): COLORURINE, LABSPEC, PHURINE, GLUCOSEU, HGBUR, BILIRUBINUR, KETONESUR, PROTEINUR, UROBILINOGEN, NITRITE, LEUKOCYTESUR in the last 72 hours.  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 Request for therapeutic paracentesis up to 5 liters. EXAM: ULTRASOUND GUIDED RIGHT LATERAL ABDOMEN PARACENTESIS MEDICATIONS: 1% Lidocaine. COMPLICATIONS: None immediate. PROCEDURE: Informed written consent was obtained from the patient after a discussion of the risks, benefits and alternatives to treatment. A timeout was performed prior to the initiation of the procedure. Initial ultrasound scanning demonstrates a large amount of ascites within the right lateral abdomen. The right lateral abdomen was prepped and draped in the usual sterile fashion. 1% lidocaine with epinephrine was used for local anesthesia. Following this, a 19 gauge, 7-cm, Yueh catheter was introduced. An ultrasound image was saved for documentation purposes. The paracentesis was performed. The catheter was removed and a dressing was applied. The patient tolerated the procedure well without immediate post procedural complication. FINDINGS: A total of approximately 5 liters of clear yellow fluid was removed. IMPRESSION: Successful ultrasound-guided paracentesis yielding 5 liters of peritoneal fluid. Read by:  Gareth Eagle, PA-C Electronically Signed   By: Corrie Mckusick D.O.   On: 05/24/2016 11:32     Medications:     . cefTRIAXone (ROCEPHIN)  IV  1 g Intravenous Q24H  . darbepoetin (ARANESP) injection - DIALYSIS  60 mcg Intravenous Q Thu-HD  . doxercalciferol  4 mcg  Intravenous Q T,Th,Sa-HD  . lactulose  20 g Oral BID  . mouth rinse  15 mL Mouth Rinse BID  . midodrine  10 mg Oral TID WC  . multivitamin  1 tablet Oral QHS  . pantoprazole  20 mg Oral BID  . rifaximin  550 mg Oral BID  . sevelamer carbonate  1,600 mg Oral TID WC  . sucralfate  1  g Oral TID WC & HS   sodium chloride, fentaNYL (SUBLIMAZE) injection, LORazepam, ondansetron (ZOFRAN) IV, oxyCODONE, sodium chloride flush, sodium chloride flush  Assessment/ Plan:  and Bipolar disorder. She was admitted 9/20 with melena and coffee ground emesis. She had skipped several dialysis treatments and became nauseated and had dry heaves/wretching. Anemia with hemoglobin to 6.4 requiring 2 units PRBC. EGD the following day demonstrated Mallory-Weiss tear with adherent clot, and varices that remain obliterated from prior treatments. Conservative management was recommended. Bleeding had improved and labs normalized so she was discharged to home 9/23 after a therapeutic paracentesis.  10/10 she presented to [facility] ED with complaints of weakness and nausea x 2 days with some dark stools. She was hypotensive in the ED and iSTAT lactic acid was 5.3.  Hemoglobin was 3.9. Due to critical illness Mrs. Feeley was transported to [facility] for ICU admission.     ESRD TTS [place]  Extra HD yesterday will plan  Regular dialysis today  GI bleed esophageal ulcer  NASH  Chronic low BP midodrine  HPTH  hectoral  Plan dialysis today     LOS: 7 Wanda [name] W @TODAY @11 :17 AM

## 2016-05-27 LAB — RENAL FUNCTION PANEL
ALBUMIN: 2 g/dL — AB (ref 3.5–5.0)
Anion gap: 10 (ref 5–15)
BUN: 14 mg/dL (ref 6–20)
CO2: 27 mmol/L (ref 22–32)
CREATININE: 3.45 mg/dL — AB (ref 0.44–1.00)
Calcium: 8.4 mg/dL — ABNORMAL LOW (ref 8.9–10.3)
Chloride: 95 mmol/L — ABNORMAL LOW (ref 101–111)
GFR calc Af Amer: 18 mL/min — ABNORMAL LOW (ref >60)
GFR calc non Af Amer: 15 mL/min — ABNORMAL LOW (ref >60)
GLUCOSE: 105 mg/dL — AB (ref 65–99)
PHOSPHORUS: 4.3 mg/dL (ref 2.5–4.6)
POTASSIUM: 3.4 mmol/L — AB (ref 3.5–5.1)
Sodium: 132 mmol/L — ABNORMAL LOW (ref 135–145)

## 2016-05-27 MED ORDER — RIFAXIMIN 550 MG PO TABS: 550.0000 mg | ORAL_TABLET | Freq: Two times a day (BID) | ORAL | 2 refills | Status: DC

## 2016-05-27 MED ORDER — SUCRALFATE 1 GM/10ML PO SUSP: 1.0000 g | Freq: Three times a day (TID) | ORAL | 0 refills | Status: DC

## 2016-05-27 MED ORDER — PANTOPRAZOLE SODIUM 20 MG PO TBEC: 20.0000 mg | DELAYED_RELEASE_TABLET | Freq: Two times a day (BID) | ORAL | 0 refills | Status: DC

## 2016-05-27 NOTE — Progress Notes (Signed)
Received patient from hemodialysis unit accompanied by 2 transporter.  Patient AOx4, pain at 10/10, and refused scheduled medications except the carafate.  Administered PRN medication Fentanyl 25 mcg per order due to chronic back pain.  Gave patient sprite to drink and sandwich to eat.  Patient sitting on bed eating.  Will monitor.

## 2016-05-27 NOTE — Progress Notes (Signed)
Pt discharged home in stable condition after going over discharge teaching. No concerns expressed.AVS and discharge meds script given prior to leaving unit

## 2016-05-27 NOTE — Progress Notes (Signed)
[facility] ROUNDING NOTE   Subjective:   Interval History: no complaints appears to be doing well still with lower extremity swelling   Objective:  Vital signs in last 24 hours:  Temp:  [97.7 F (36.5 C)-98.5 F (36.9 C)] 98 F (36.7 C) (10/18 0537) Pulse Rate:  [73-106] 85 (10/18 0633) Resp:  [18] 18 (10/18 0633) BP: (87-105)/(37-63) 93/37 (10/18 0633) SpO2:  [99 %-100 %] 100 % (10/18 [id number]) Weight:  [66.9 kg (147 lb 7.8 oz)-69.8 kg (153 lb 14.1 oz)] 66.9 kg (147 lb 7.8 oz) (10/17 2316)  Weight change: -3.8 kg (-8 lb 6 oz) Filed Weights   05/25/16 1820 05/26/16 1900 05/26/16 2316  Weight: 71 kg (156 lb 8.4 oz) 69.8 kg (153 lb 14.1 oz) 66.9 kg (147 lb 7.8 oz)    Intake/Output: I/O last 3 completed shifts: In: 86 [P.O.:720] Out: 2801 [Urine:1; Other:2800]   Intake/Output this shift:  Total I/O In: 30 [I.V.:30] Out: -   Weight change:down 66.9 Kg   [id number] and alert CVS:RRR 1/6 systolic M Resp:Decreased BS bases Abd:+ BS + distention from ascites, umbilical hernia Ext: [id number] edema RUA AVF + bruit NEURO:CNI Ox3 no asterixis   Basic Metabolic Panel:  Recent Labs Lab 05/20/16 1630 05/21/16 0450 05/22/16 0457 05/23/16 0406 05/24/16 0500 05/25/16 0643 05/26/16 0434 05/27/16 0448  NA 133* 131* 134* 131* 134* 133* 135 132*  K 4.8 5.8* 4.1 3.9 4.3 4.0 3.2* 3.4*  CL 99* 96* 99* 96* 97* 95* 97* 95*  CO2 20* 20* 23 21* 24 23 26 27   GLUCOSE 130* 105* 98 125* 100* 109* 114* 105*  BUN 107* 129* 68* 80* 44* 55* 32* 14  CREATININE 6.77* 7.73* 5.48* 6.69* 5.05* 6.70* 5.08* 3.45*  CALCIUM 8.1* 8.0* 8.1* 8.5* 8.7* 8.7* 8.6* 8.4*  MG 2.2 2.4 2.2 2.2 2.2  --   --   --   PHOS 7.2* 8.8* 6.8* 7.7* 7.0* 8.6* 6.8* 4.3    Liver Function Tests:  Recent Labs Lab 05/21/16 0450  05/23/16 0406 05/24/16 0500 05/25/16 0643 05/26/16 0434 05/27/16 0448  AST 100*  --   --   --   --   --   --   ALT 42  --   --   --   --   --   --   ALKPHOS 60  --   --   --    --   --   --   BILITOT 2.1*  --   --   --   --   --   --   PROT 3.7*  --   --   --   --   --   --   ALBUMIN 1.7*  < > 1.8* 2.0* 1.9* 2.0* 2.0*  < > = values in this interval not displayed. No results for input(s): LIPASE, AMYLASE in the last 168 hours. No results for input(s): AMMONIA in the last 168 hours.  CBC:  Recent Labs Lab 05/21/16 0118 05/21/16 1030 05/21/16 2118 05/22/16 1000 05/22/16 2244 05/24/16 0500 05/25/16 0643 05/26/16 0434  WBC 16.6* 14.2* 14.8* 14.4* 15.4* 12.2* 8.5 7.1  NEUTROABS 12.2* 10.7* 11.2* 10.2* 10.9*  --   --   --   HGB 7.1* 6.7* 7.8* 8.1* 8.4* 8.7* 7.8* 8.0*  HCT 20.1* 19.6* 23.3* 24.3* 26.1* 27.7* 24.7* 25.0*  MCV 88.2 91.6 92.1 93.1 94.9 97.5 97.6 98.4  PLT 89* 75* 64* 67* 80* 71* 69* 52*    Cardiac Enzymes: No results  for input(s): CKTOTAL, CKMB, CKMBINDEX, TROPONINI in the last 168 hours.  BNP: Invalid input(s): POCBNP  CBG: No results for input(s): GLUCAP in the last 168 hours.  Microbiology: Results for orders placed or performed during the hospital encounter of 05/19/16  Blood Culture (routine x 2)     Status: None   Collection Time: 05/19/16  3:51 PM  Result Value Ref Range Status   Specimen Description BLOOD SITE NOT SPECIFIED  Final   Special Requests BOTTLES DRAWN AEROBIC AND ANAEROBIC [place]  Final   Culture NO GROWTH 5 DAYS  Final   Report Status 05/24/2016 FINAL  Final  Blood Culture (routine x 2)     Status: None   Collection Time: 05/19/16  4:04 PM  Result Value Ref Range Status   Specimen Description BLOOD LEFT ARM  Final   Special Requests BOTTLES DRAWN AEROBIC AND ANAEROBIC 6CC  Final   Culture NO GROWTH 5 DAYS  Final   Report Status 05/24/2016 FINAL  Final  MRSA PCR Screening     Status: None   Collection Time: 05/19/16  7:19 PM  Result Value Ref Range Status   MRSA by PCR NEGATIVE NEGATIVE Final    Comment:        The GeneXpert MRSA Assay (FDA approved for NASAL specimens only), is one component of  a comprehensive MRSA colonization surveillance program. It is not intended to diagnose MRSA infection nor to guide or monitor treatment for MRSA infections.     Coagulation Studies: No results for input(s): LABPROT, INR in the last 72 hours.  Urinalysis: No results for input(s): COLORURINE, LABSPEC, PHURINE, GLUCOSEU, HGBUR, BILIRUBINUR, KETONESUR, PROTEINUR, UROBILINOGEN, NITRITE, LEUKOCYTESUR in the last 72 hours.  Invalid input(s): APPERANCEUR    Imaging: No results found.   Medications:     . cefTRIAXone (ROCEPHIN)  IV  1 g Intravenous Q24H  . darbepoetin (ARANESP) injection - DIALYSIS  60 mcg Intravenous Q Thu-HD  . doxercalciferol  4 mcg Intravenous Q T,Th,Sa-HD  . lactulose  20 g Oral BID  . mouth rinse  15 mL Mouth Rinse BID  . midodrine  10 mg Oral TID WC  . multivitamin  1 tablet Oral QHS  . pantoprazole  20 mg Oral BID  . rifaximin  550 mg Oral BID  . sevelamer carbonate  1,600 mg Oral TID WC  . sucralfate  1 g Oral TID WC & HS   sodium chloride, sodium chloride, sodium chloride, alteplase, fentaNYL (SUBLIMAZE) injection, heparin, lidocaine (PF), lidocaine-prilocaine, LORazepam, ondansetron (ZOFRAN) IV, oxyCODONE, pentafluoroprop-tetrafluoroeth, sodium chloride flush, sodium chloride flush  Assessment/ Plan:   admitted 9/20 with melena and coffee ground emesis. She had skipped several dialysis treatments and became nauseated and had dry heaves/wretching. Anemia with hemoglobin to 6.4 requiring 2 units PRBC. EGD the following day demonstrated Mallory-Weiss tear with adherent clot, and varices that remain obliterated from prior treatments. Conservative management was recommended. Bleeding had improved and labs normalized so she was discharged to home 9/23 after a therapeutic paracentesis.  10/10 she presented to [facility] ED with complaints of weakness and nausea x 2 days with some dark stools. She was hypotensive in the ED and iSTAT lactic acid was 5.3.  Hemoglobin was 3.9. Due to critical illness Mrs. Yarbro was transported to [facility] for ICU admission.     ESRD TTS   Plan discharge today   GI bleed esophageal ulcer  NASH  Chronic low BP midodrine  HPTH  hectoral  LOS: 8 Reggie Bise W @TODAY @12 :07 PM

## 2016-05-27 NOTE — Care Management Note (Signed)
Case Management Note  Patient Details  Name: Wanda [name] MRN: [id number] Date of Birth: 10-01-1975  Subjective/Objective:                    Action/Plan:  PT recommending HHPT and walker . Dr Reynaldo Minium in length of stay meeting recommended home health . Explained to patient , patient refused all home health and stated she already has a walker at home.    Expected Discharge Date:                  Expected Discharge Plan:  Home/Self Care  In-House Referral:     Discharge planning Services  CM Consult  Post Acute Care Choice:  Home Health, Durable Medical Equipment Choice offered to:  Patient  DME Arranged:    DME Agency:     HH Arranged:  Patient Refused [place] Agency:     Status of Service:  Completed, signed off  If discussed at [employer] of Stay Meetings, dates discussed:    Additional Comments:  Marilu Favre, RN 05/27/2016, 9:05 AM

## 2016-05-27 NOTE — Discharge Summary (Signed)
Physician Discharge Summary  Wanda [name] [id number] DOB: Sep 04, 1975 DOA: 05/19/2016  PCP: [name], MD  Admit date: 05/19/2016 Discharge date: 05/27/2016  Admitted From: home  Disposition:  Home   Recommendations for Outpatient Follow-up:  1. Follow up with PCP in 1-2 weeks 2. Please obtain BMP/CBC in one week 3. Needs further HD for fluids removal/   Home Health: patient decline    Discharge Condition: stable.  CODE STATUS: full code.  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 She was admitted 9/20 with melena and coffee ground emesis. She had skipped several dialysis treatments and became nauseated and had dry heaves/wretching. Anemia with hemoglobin to 6.4 requiring 2 units PRBC. EGD the following day demonstrated Mallory-Weiss tear with adherent clot, and varices that remain obliterated from prior treatments. Conservative management was recommended. Bleeding had improved and labs normalized so she was discharged to home 9/23 after a therapeutic paracentesis.  10/10 she presented to [facility] ED with complaints of weakness and nausea x 2 days with some dark stools. She was hypotensive in the ED and iSTAT lactic acid was 5.3.  Hemoglobin was 3.9. Due to critical illness Mrs. Galuska was transported to [facility] for ICU admission.   Significant Events: 9/20-9/23 admit to [facility] for mallory weiss tear, clotted on EGD and managed conservatively.  10/10 - admit with severe anemia w/ GI Bleeding  10/12 EGD > non-bleeding distal esophageal ulcer   Subjective: Still with edema legs. No hematemesis.   Assessment & Plan:  Upper Gastrointestinal Bleed Recent Mallory-Weiss tear - Esophageal ulcer on EGD 10/12 - did not take PPI after recent d/c - cont PPI + carafate - no  evidence of active bleeding at this time  Hb stable.   Acute blood loss anemia w/ anemia of chronic disease S/P 5u PRBC on10/11 & 1u 10/12 -  Hb stable.   Hemorrhagic Shock - Chronic Hypotension  Resolved with volume and blood products - BP remains low but appears to be near baseline    NASH Cirrhosis - Hx of SBP and Varices s/p banding Underwent paracentesis 10-15.  Continue with PRN paracentesis   Protein-Calorie Malnutrition Encouraged pt to improve intake - she ate her entire breakfast tray this morning   Thrombocytopenia Stable - due to liver disease   Acute Hypoxic Respiratory Failure Likely secondary to volume - resolved    ESRD on HD (TTF) Nephrology following - had extra HD 10-16 Plan  to resume outpatient HD, Thursday and Friday. Continue fluid removal as BP tolerates it   Hyponatremia Chronic in setting of liver disease - improved post HD   Acute Encephalopathy - Multfactorial Essentially resolved - likely due to anemia &hypotension  Discharge Diagnoses:  Active Problems:   Ascites   ESRD (end stage renal disease) on dialysis (HCC)   Mallory-Weiss tear   Shock ([place])   Pressure injury of skin    Discharge Instructions  Discharge Instructions    Diet - low sodium heart healthy    Complete by:  As directed    Increase activity slowly    Complete by:  As directed        Medication List    STOP taking these medications   promethazine 12.5 MG tablet Commonly known as:  PHENERGAN     TAKE these medications   clotrimazole 1 % cream  Commonly known as:  LOTRIMIN APPLY TOPICALLY THREE TIMES DAILY FOR 10 DAYS   Darbepoetin Alfa 60 MCG/0.3ML Sosy injection Commonly known as:  ARANESP Inject 0.3 mLs (60 mcg total) into the vein every Thursday with hemodialysis. What changed:  when to take this   lactulose 10 GM/15ML solution Commonly known as:  CHRONULAC TAKE 3 TEASPOONSFUL (15ML) BY MOUTH TWICE DAILY What changed:  See the new  instructions.   lidocaine 2 % solution Commonly known as:  XYLOCAINE TAKE TWO TEASPOONSFUL (10ML) BY MOUTH BEFORE MEALS AND AT BEDTIME TO PREVENT CHEST PAIN WHILE EATING   lidocaine-prilocaine cream Commonly known as:  EMLA Apply 1 application topically as needed (for dialysis treatments).   LORazepam 1 MG tablet Commonly known as:  ATIVAN TAKE 1/2 TO 1 TABLET BY MOUTH TWICE DAILY AS NEEDED FOR BACK SPASM OR FOR ANXIETY What changed:  how much to take  how to take this  when to take this  reasons to take this  additional instructions   midodrine 10 MG tablet Commonly known as:  PROAMATINE Take 1 tablet (10 mg total) by mouth daily. *Takes only on dialysis days on Tuesdays, Thursdays, and Saturdays (Sometimes takes treatments on Friday instead of Saturday)**Medication to keep your blood pressure from falling. What changed:  when to take this  additional instructions   Oxycodone HCl 10 MG Tabs 1 PO TID AS NEEDED FOR PAIN What changed:  how much to take  how to take this  when to take this  reasons to take this  additional instructions   pantoprazole 20 MG tablet Commonly known as:  PROTONIX Take 1 tablet (20 mg total) by mouth 2 (two) times daily. What changed:  medication strength  how much to take   RENVELA 800 MG tablet Generic drug:  sevelamer carbonate Take 1600 mg by mouth 3 times daily with meals. Take 800 mg by mouth with snacks.   rifaximin 550 MG Tabs tablet Commonly known as:  XIFAXAN Take 1 tablet (550 mg total) by mouth 2 (two) times daily before a meal.   sucralfate 1 GM/10ML suspension Commonly known as:  CARAFATE Take 10 mLs (1 g total) by mouth 4 (four) times daily -  with meals and at bedtime.        Consultations:  Nephrology   CCM   GI   Procedures/Studies: Dg Chest 2 View  Result Date: 05/19/2016 CLINICAL DATA:  Fatigue and nausea for 2-3 days EXAM: CHEST  2 VIEW COMPARISON:  04/30/2016 FINDINGS: Low volume chest  with interstitial crowding and atelectasis. There is no edema, consolidation, effusion, or pneumothorax. Normal heart size and mediastinal contours for technique. IMPRESSION: Low volume chest without acute finding. Electronically Signed   By: Monte Fantasia M.D.   On: 05/19/2016 15:44   US Abdomen Complete  Result Date: 05/20/2016 CLINICAL DATA:  Cirrhosis, dialysis patient. Abdominal pain with distention EXAM: ABDOMEN ULTRASOUND COMPLETE COMPARISON:  04/29/2016, CT scan 01/02/2016 FINDINGS: Gallbladder: Shadowing stone measuring 8 mm is present within the gallbladder lumen. No sonographic Murphy's sign per sonographer. Mild wall thickening measuring 3.1 mm. Common bile duct: Diameter: 4.1 mm Liver: Heterogenous echotexture with nodularity consistent with cirrhosis. No gross focal hepatic abnormality. IVC: No abnormality visualized. Pancreas: Visualized portion unremarkable. Spleen: Size and appearance within normal limits. Right Kidney: Length: 9.4 cm. Slight increased cortical echogenicity. No mass or hydronephrosis visualized. Left Kidney: Length: 7.7 cm. Slight increased cortical echogenicity. No mass or hydronephrosis visualized. Abdominal aorta: No aneurysm visualized. Poor visualization of the  distal aorta and bifurcation. Other findings: Large volume of ascites. Tortuous vessels superior to the you right kidney and spleen, consistent with varices. IMPRESSION: 1. Gallstone. Mild nonspecific wall thickening of the gallbladder. Normal common bile duct diameter. 2. Cirrhotic appearing liver. 3. Large volume of ascites. 4. Slightly small appearing kidneys with mild increased cortical echogenicity. Electronically Signed   By: Donavan Foil M.D.   On: 05/20/2016 21:08   US Abdomen Limited  Result Date: 04/29/2016 CLINICAL DATA:  Cirrhosis, ascites EXAM: LIMITED ABDOMEN ULTRASOUND FOR ASCITES TECHNIQUE: Limited ultrasound survey for ascites was performed in all four abdominal quadrants. COMPARISON:   04/21/2016 FINDINGS: Scattered ascites is identified in all 4 quadrants greater in LEFT lower quadrant. IMPRESSION: Scattered ascites throughout abdomen. Electronically Signed   By: Lavonia Dana M.D.   On: 04/29/2016 10:29   US Paracentesis  Result Date: 05/24/2016 INDICATION: Ascites secondary to NASH cirrhosis. Request for therapeutic paracentesis up to 5 liters. EXAM: ULTRASOUND GUIDED RIGHT LATERAL ABDOMEN PARACENTESIS MEDICATIONS: 1% Lidocaine. COMPLICATIONS: None immediate. PROCEDURE: Informed written consent was obtained from the patient after a discussion of the risks, benefits and alternatives to treatment. A timeout was performed prior to the initiation of the procedure. Initial ultrasound scanning demonstrates a large amount of ascites within the right lateral abdomen. The right lateral abdomen was prepped and draped in the usual sterile fashion. 1% lidocaine with epinephrine was used for local anesthesia. Following this, a 19 gauge, 7-cm, Yueh catheter was introduced. An ultrasound image was saved for documentation purposes. The paracentesis was performed. The catheter was removed and a dressing was applied. The patient tolerated the procedure well without immediate post procedural complication. FINDINGS: A total of approximately 5 liters of clear yellow fluid was removed. IMPRESSION: Successful ultrasound-guided paracentesis yielding 5 liters of peritoneal fluid. Read by:  Gareth Eagle, PA-C Electronically Signed   By: Corrie Mckusick D.O.   On: 05/24/2016 11:32   US Paracentesis  Result Date: 05/13/2016 INDICATION: Cirrhosis, ascites EXAM: ULTRASOUND GUIDED THERAPEUTIC PARACENTESIS MEDICATIONS: None. COMPLICATIONS: None immediate. PROCEDURE: Procedure, benefits, and risks of procedure were discussed with patient. Written informed consent for procedure was obtained. Time out protocol followed. Adequate collection of ascites localized by ultrasound in lateral LEFT mid abdomen. Skin prepped and  draped in usual sterile fashion. Skin and soft tissues anesthetized with 10 mL of 1% lidocaine. 5 [place] Yueh catheter placed into peritoneal cavity. 3.5 L of clear yellow fluid fluid aspirated by vacuum bottle suction. Procedure tolerated well by patient without immediate complication. FINDINGS: As above IMPRESSION: Successful ultrasound-guided paracentesis yielding 3.5 liters of peritoneal fluid. Electronically Signed   By: Lavonia Dana M.D.   On: 05/13/2016 15:00   US Paracentesis  Result Date: 05/06/2016 INDICATION: Ascites for paracentesis EXAM: ULTRASOUND GUIDED RIGHT PARACENTESIS MEDICATIONS: None. COMPLICATIONS: None immediate. PROCEDURE: Informed written consent was obtained from the patient after a discussion of the risks, benefits and alternatives to treatment. A timeout was performed prior to the initiation of the procedure. Initial ultrasound scanning demonstrates a large amount of ascites within the right lower abdominal quadrant. The right lower abdomen was prepped and draped in the usual sterile fashion. 1% lidocaine with epinephrine was used for local anesthesia. Following this, a 19 gauge, 7-cm, Yueh catheter was introduced. An ultrasound image was saved for documentation purposes. The paracentesis was performed. The catheter was removed and a dressing was applied. The patient tolerated the procedure well without immediate post procedural complication. FINDINGS: A total of approximately 4.1 L of yellow fluid  was removed. IMPRESSION: Successful ultrasound-guided paracentesis yielding 4.1 liters of peritoneal fluid. Electronically Signed   By: Julian Hy M.D.   On: 05/06/2016 14:38   US Paracentesis  Result Date: 05/01/2016 INDICATION: Cirrhosis, ascites EXAM: ULTRASOUND GUIDED THERAPEUTIC PARACENTESIS MEDICATIONS: None. COMPLICATIONS: None immediate. PROCEDURE: Procedure, benefits, and risks of procedure were discussed with patient. Written informed consent for procedure was obtained.  Time out protocol followed. Adequate collection of ascites localized by ultrasound in LEFT lower quadrant. Skin prepped and draped in usual sterile fashion. Skin and soft tissues anesthetized with 10 mL of 1% lidocaine. 5 [place] Yueh catheter placed into peritoneal cavity. 5.4 L of yellow fluid aspirated by vacuum bottle suction. Procedure tolerated well by patient without immediate complication. FINDINGS: As above IMPRESSION: Successful ultrasound-guided paracentesis yielding 5.4 liters of peritoneal fluid. Electronically Signed   By: Lavonia Dana M.D.   On: 05/01/2016 12:25   Dg Chest Portable 1 View  Result Date: 05/20/2016 CLINICAL DATA:  Central line placement. EXAM: PORTABLE CHEST 1 VIEW COMPARISON:  05/19/2016 FINDINGS: The left IJ central venous catheter tip is in the distal SVC near the cavoatrial junction. No complicating features. The cardiac silhouette, mediastinal and hilar contours are within normal limits and stable. Stable tortuosity and calcification of the thoracic aorta. Low lung volumes with vascular crowding and streaky bibasilar atelectasis. IMPRESSION: Left IJ central venous catheter in good position with the tip near the cavoatrial junction. Electronically Signed   By: Marijo Sanes M.D.   On: 05/20/2016 17:26   Dg Chest Port 1 View  Result Date: 04/30/2016 CLINICAL DATA:  [place].  Former smoker. EXAM: PORTABLE CHEST 1 VIEW COMPARISON:  10/08/2015 FINDINGS: The cardiac silhouette is upper limits of normal in size, accentuated by portable AP technique as well as shallow inspiration. There is central pulmonary vascular congestion. Minimal bibasilar opacities suggest atelectasis. No overt pulmonary edema, sizable pleural effusion, or pneumothorax is identified. No acute osseous abnormality is identified. IMPRESSION: Shallow lung inflation with pulmonary vascular congestion and minimal bibasilar atelectasis. Electronically Signed   By: Logan Bores M.D.   On: 04/30/2016 08:52        Discharge Exam: Vitals:   05/27/16 0537 05/27/16 0633  BP: (!) 87/37 (!) 93/37  Pulse: 85 85  Resp: 18 18  Temp: 98 F (36.7 C)    Vitals:   05/26/16 2300 05/26/16 2316 05/27/16 0537 05/27/16 0633  BP: (!) 89/45 91/63 (!) 87/37 (!) 93/37  Pulse: 99 (!) 106 85 85  Resp: 18 18 18 18   Temp:  98.5 F (36.9 C) 98 F (36.7 C)   TempSrc:  Oral Oral   SpO2:  99% 99% 100%  Weight:  66.9 kg (147 lb 7.8 oz)    Height:        General: Pt is alert, awake, not in acute distress Cardiovascular: RRR, S1/S2 +, no rubs, no gallops Respiratory: CTA bilaterally, no wheezing, no rhonchi Abdominal: Soft, NT, distended, umbilical hernia Extremities; plus 2 edema    The results of significant diagnostics from this hospitalization (including imaging, microbiology, ancillary and laboratory) are listed below for reference.     Microbiology: Recent Results (from the past 240 hour(s))  Blood Culture (routine x 2)     Status: None   Collection Time: 05/19/16  3:51 PM  Result Value Ref Range Status   Specimen Description BLOOD SITE NOT SPECIFIED  Final   Special Requests BOTTLES DRAWN AEROBIC AND ANAEROBIC 6CC EACH  Final   Culture NO GROWTH 5 DAYS  Final   Report Status 05/24/2016 FINAL  Final  Blood Culture (routine x 2)     Status: None   Collection Time: 05/19/16  4:04 PM  Result Value Ref Range Status   Specimen Description BLOOD LEFT ARM  Final   Special Requests BOTTLES DRAWN AEROBIC AND ANAEROBIC 6CC  Final   Culture NO GROWTH 5 DAYS  Final   Report Status 05/24/2016 FINAL  Final  MRSA PCR Screening     Status: None   Collection Time: 05/19/16  7:19 PM  Result Value Ref Range Status   MRSA by PCR NEGATIVE NEGATIVE Final    Comment:        The GeneXpert MRSA Assay (FDA approved for NASAL specimens only), is one component of a comprehensive MRSA colonization surveillance program. It is not intended to diagnose MRSA infection nor to guide or monitor treatment for MRSA  infections.      Labs: BNP (last 3 results)  Recent Labs  05/19/16 1510  BNP 0000000   Basic Metabolic Panel:  Recent Labs Lab 05/20/16 1630 05/21/16 0450 05/22/16 0457 05/23/16 0406 05/24/16 0500 05/25/16 0643 05/26/16 0434 05/27/16 0448  NA 133* 131* 134* 131* 134* 133* 135 132*  K 4.8 5.8* 4.1 3.9 4.3 4.0 3.2* 3.4*  CL 99* 96* 99* 96* 97* 95* 97* 95*  CO2 20* 20* 23 21* 24 23 26 27   GLUCOSE 130* 105* 98 125* 100* 109* 114* 105*  BUN 107* 129* 68* 80* 44* 55* 32* 14  CREATININE 6.77* 7.73* 5.48* 6.69* 5.05* 6.70* 5.08* 3.45*  CALCIUM 8.1* 8.0* 8.1* 8.5* 8.7* 8.7* 8.6* 8.4*  MG 2.2 2.4 2.2 2.2 2.2  --   --   --   PHOS 7.2* 8.8* 6.8* 7.7* 7.0* 8.6* 6.8* 4.3   Liver Function Tests:  Recent Labs Lab 05/21/16 0450  05/23/16 0406 05/24/16 0500 05/25/16 0643 05/26/16 0434 05/27/16 0448  AST 100*  --   --   --   --   --   --   ALT 42  --   --   --   --   --   --   ALKPHOS 60  --   --   --   --   --   --   BILITOT 2.1*  --   --   --   --   --   --   PROT 3.7*  --   --   --   --   --   --   ALBUMIN 1.7*  < > 1.8* 2.0* 1.9* 2.0* 2.0*  < > = values in this interval not displayed. No results for input(s): LIPASE, AMYLASE in the last 168 hours. No results for input(s): AMMONIA in the last 168 hours. CBC:  Recent Labs Lab 05/21/16 0118 05/21/16 1030 05/21/16 2118 05/22/16 1000 05/22/16 2244 05/24/16 0500 05/25/16 0643 05/26/16 0434  WBC 16.6* 14.2* 14.8* 14.4* 15.4* 12.2* 8.5 7.1  NEUTROABS 12.2* 10.7* 11.2* 10.2* 10.9*  --   --   --   HGB 7.1* 6.7* 7.8* 8.1* 8.4* 8.7* 7.8* 8.0*  HCT 20.1* 19.6* 23.3* 24.3* 26.1* 27.7* 24.7* 25.0*  MCV 88.2 91.6 92.1 93.1 94.9 97.5 97.6 98.4  PLT 89* 75* 64* 67* 80* 71* 69* 52*   Cardiac Enzymes: No results for input(s): CKTOTAL, CKMB, CKMBINDEX, TROPONINI in the last 168 hours. BNP: Invalid input(s): POCBNP CBG: No results for input(s): GLUCAP in the last 168 hours. D-Dimer No results for input(s): DDIMER in the  last  72 hours. Hgb A1c No results for input(s): HGBA1C in the last 72 hours. Lipid Profile No results for input(s): CHOL, HDL, LDLCALC, TRIG, CHOLHDL, LDLDIRECT in the last 72 hours. Thyroid function studies No results for input(s): TSH, T4TOTAL, T3FREE, THYROIDAB in the last 72 hours.  Invalid input(s): FREET3 Anemia work up No results for input(s): VITAMINB12, FOLATE, FERRITIN, TIBC, IRON, RETICCTPCT in the last 72 hours. Urinalysis    Component Value Date/Time   COLORURINE YELLOW 04/29/2016 0025   APPEARANCEUR CLEAR 04/29/2016 0025   LABSPEC 1.020 04/29/2016 0025   PHURINE 5.5 04/29/2016 0025   GLUCOSEU NEGATIVE 04/29/2016 0025   HGBUR NEGATIVE 04/29/2016 0025   BILIRUBINUR NEGATIVE 04/29/2016 0025   KETONESUR NEGATIVE 04/29/2016 0025   PROTEINUR NEGATIVE 04/29/2016 0025   UROBILINOGEN 0.2 08/13/2014 2138   NITRITE NEGATIVE 04/29/2016 0025   LEUKOCYTESUR NEGATIVE 04/29/2016 0025   Sepsis Labs Invalid input(s): PROCALCITONIN,  WBC,  LACTICIDVEN Microbiology Recent Results (from the past 240 hour(s))  Blood Culture (routine x 2)     Status: None   Collection Time: 05/19/16  3:51 PM  Result Value Ref Range Status   Specimen Description BLOOD SITE NOT SPECIFIED  Final   Special Requests BOTTLES DRAWN AEROBIC AND ANAEROBIC [place]  Final   Culture NO GROWTH 5 DAYS  Final   Report Status 05/24/2016 FINAL  Final  Blood Culture (routine x 2)     Status: None   Collection Time: 05/19/16  4:04 PM  Result Value Ref Range Status   Specimen Description BLOOD LEFT ARM  Final   Special Requests BOTTLES DRAWN AEROBIC AND ANAEROBIC 6CC  Final   Culture NO GROWTH 5 DAYS  Final   Report Status 05/24/2016 FINAL  Final  MRSA PCR Screening     Status: None   Collection Time: 05/19/16  7:19 PM  Result Value Ref Range Status   MRSA by PCR NEGATIVE NEGATIVE Final    Comment:        The GeneXpert MRSA Assay (FDA approved for NASAL specimens only), is one component of a comprehensive MRSA  colonization surveillance program. It is not intended to diagnose MRSA infection nor to guide or monitor treatment for MRSA infections.      Time coordinating discharge: Over 30 minutes  SIGNED:   Elmarie Shiley, MD  [facility] 05/27/2016, 11:46 AM Pager   If 7PM-7AM, please contact night-coverage [email or web address] Password [email or web address]

## 2016-05-27 NOTE — Progress Notes (Signed)
Patient has low BP at 93/37 which is chronic and denies any dizziness but complained of chronic back pain at 9/10.  Administered PRN medication Oxy IR 10 mg PO as per order.  Repositioned patient in bed.  Will monitor.

## 2016-06-03 ENCOUNTER — Ambulatory Visit (HOSPITAL_COMMUNITY)
Admission: RE | Admit: 2016-06-03 | Discharge: 2016-06-03 | Disposition: A | Discharge status: Home / Self Care | Payer: Medicare Other | Source: Ambulatory Visit | Attending: Gastroenterology | Admitting: Gastroenterology

## 2016-06-03 DIAGNOSIS — R188 Other ascites: Secondary | ICD-10-CM | POA: Diagnosis present

## 2016-06-03 DIAGNOSIS — K7460 Unspecified cirrhosis of liver: Secondary | ICD-10-CM | POA: Insufficient documentation

## 2016-06-03 NOTE — Procedures (Signed)
PreOperative Dx: Cirrhosis, ascites Postoperative Dx: Cirrhosis, ascites Procedure:   US guided paracentesis Radiologist:  Kiah Vanalstine Anesthesia:  10 ml of1% lidocaine Specimen:  4.5 L of yellow ascitic fluid EBL:   < 1 ml Complications: None  

## 2016-06-03 NOTE — Progress Notes (Signed)
Paracentesis complete no signs of distress.  

## 2016-06-10 ENCOUNTER — Encounter (HOSPITAL_COMMUNITY): Payer: Self-pay

## 2016-06-10 ENCOUNTER — Ambulatory Visit (HOSPITAL_COMMUNITY)
Admission: RE | Admit: 2016-06-10 | Discharge: 2016-06-10 | Disposition: A | Discharge status: Home / Self Care | Payer: Medicare Other | Source: Ambulatory Visit | Attending: Gastroenterology | Admitting: Gastroenterology

## 2016-06-10 DIAGNOSIS — K7460 Unspecified cirrhosis of liver: Secondary | ICD-10-CM | POA: Diagnosis not present

## 2016-06-10 DIAGNOSIS — R188 Other ascites: Secondary | ICD-10-CM | POA: Diagnosis present

## 2016-06-10 MED ORDER — ALBUMIN HUMAN 25 % IV SOLN
INTRAVENOUS | Status: AC
  Administered 2016-06-10: 25 g via INTRAVENOUS
  Filled 2016-06-10: qty 100

## 2016-06-10 MED ORDER — ALBUMIN HUMAN 25 % IV SOLN
25.0000 g | Freq: Once | INTRAVENOUS | Status: AC
  Administered 2016-06-10: 25 g via INTRAVENOUS

## 2016-06-10 NOTE — Progress Notes (Signed)
Paracentesis complete no signs of distress. 2100 ml yellow colored ascites removed.

## 2016-06-10 NOTE — Procedures (Signed)
PreOperative Dx: Cirrhosis, ascites Postoperative Dx: Cirrhosis, ascites Procedure:   US guided paracentesis Radiologist:  Thornton Papas Anesthesia:  10 ml of1% lidocaine Specimen:  2.1 L of light yellow ascitic fluid EBL:   < 1 ml Complications: None

## 2016-06-16 ENCOUNTER — Telehealth: Payer: Self-pay | Admitting: Gastroenterology

## 2016-06-16 NOTE — Telephone Encounter (Signed)
Routing to the refill box. 

## 2016-06-16 NOTE — Telephone Encounter (Signed)
Pt called to say that she needed a refill on nidodrine ?[name] She uses [facility] in [place]. She is also aware of her para tomorrow and she gave me [name]'s new number to call if we needed to contact her.

## 2016-06-16 NOTE — Telephone Encounter (Signed)
I don't think we prescribed that? Needs to get from Dr. Thereasa Solo who originally prescribed.

## 2016-06-17 ENCOUNTER — Ambulatory Visit (HOSPITAL_COMMUNITY)
Admission: RE | Admit: 2016-06-17 | Discharge: 2016-06-17 | Disposition: A | Discharge status: Home / Self Care | Payer: Medicare Other | Source: Ambulatory Visit | Attending: Gastroenterology | Admitting: Gastroenterology

## 2016-06-17 DIAGNOSIS — R188 Other ascites: Secondary | ICD-10-CM | POA: Insufficient documentation

## 2016-06-17 NOTE — Progress Notes (Signed)
Paracentesis complete no signs of distress. 2250 ml yellow colored ascites removed.

## 2016-06-18 NOTE — Telephone Encounter (Signed)
Called and left the full message on the VM for [place]. Told him to call if he has questions.

## 2016-06-21 IMAGING — US US PARACENTESIS
1 series · 3 of 3 positions shown · non-contrast
Comparison: none

INDICATION: Symptomatic ascites

[Series 1: us paracentesis · 0.22mm/px · 3 of 3 slices shown]
[im 1/3]
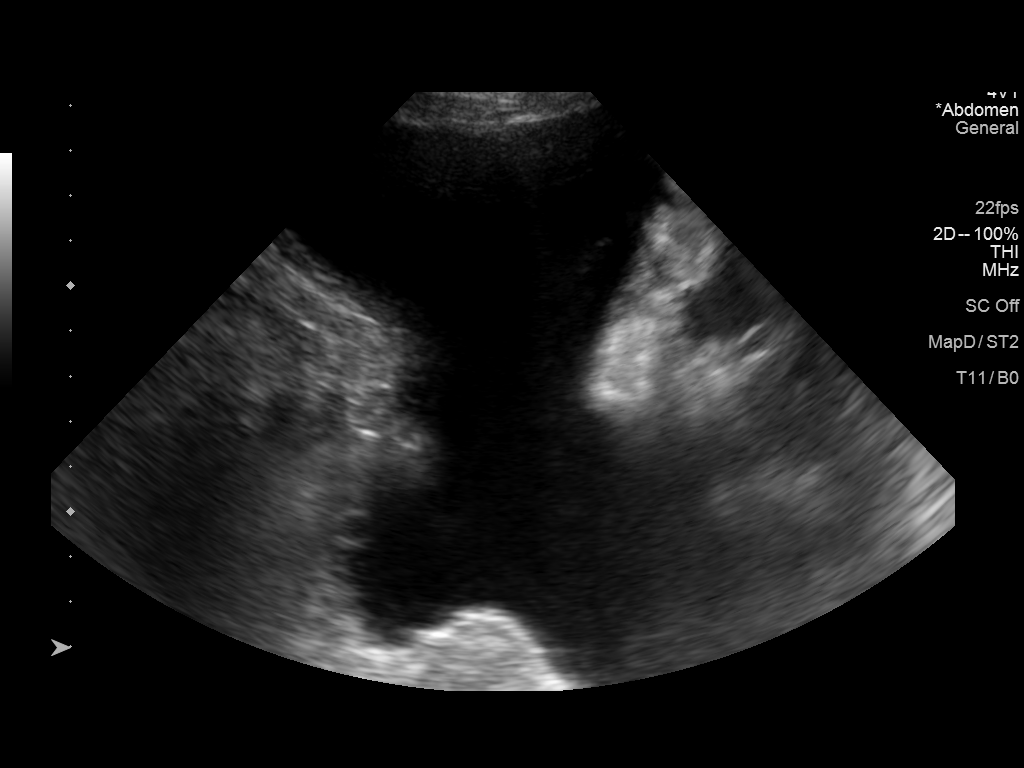
[im 2/3]
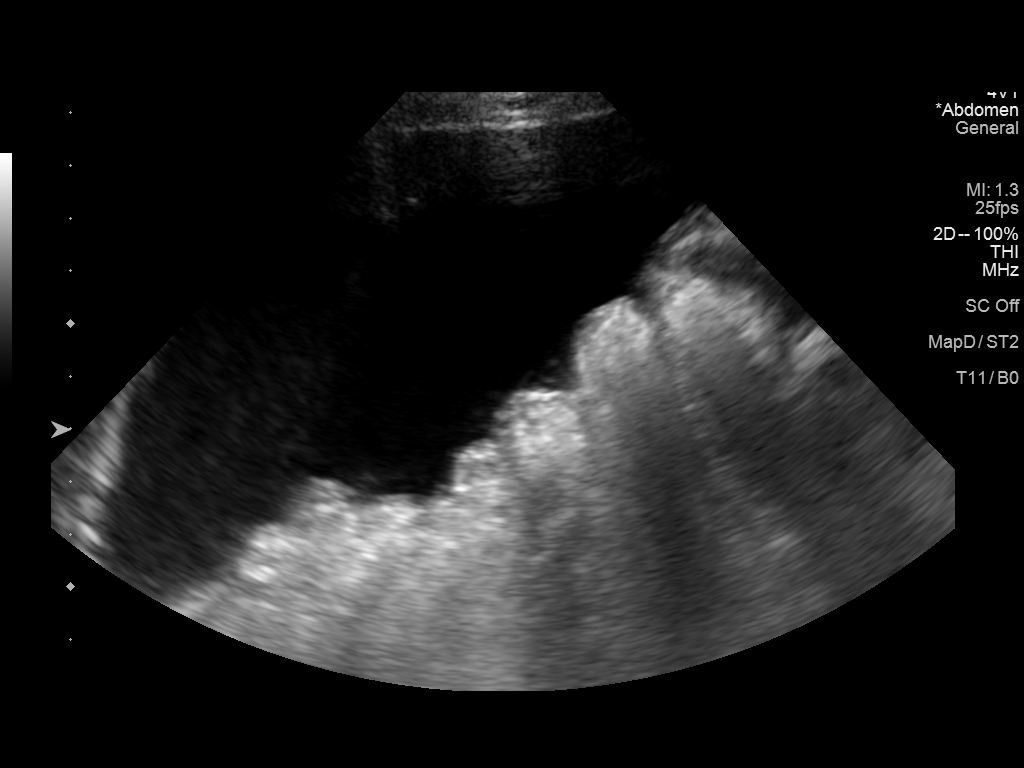
[im 3/3]
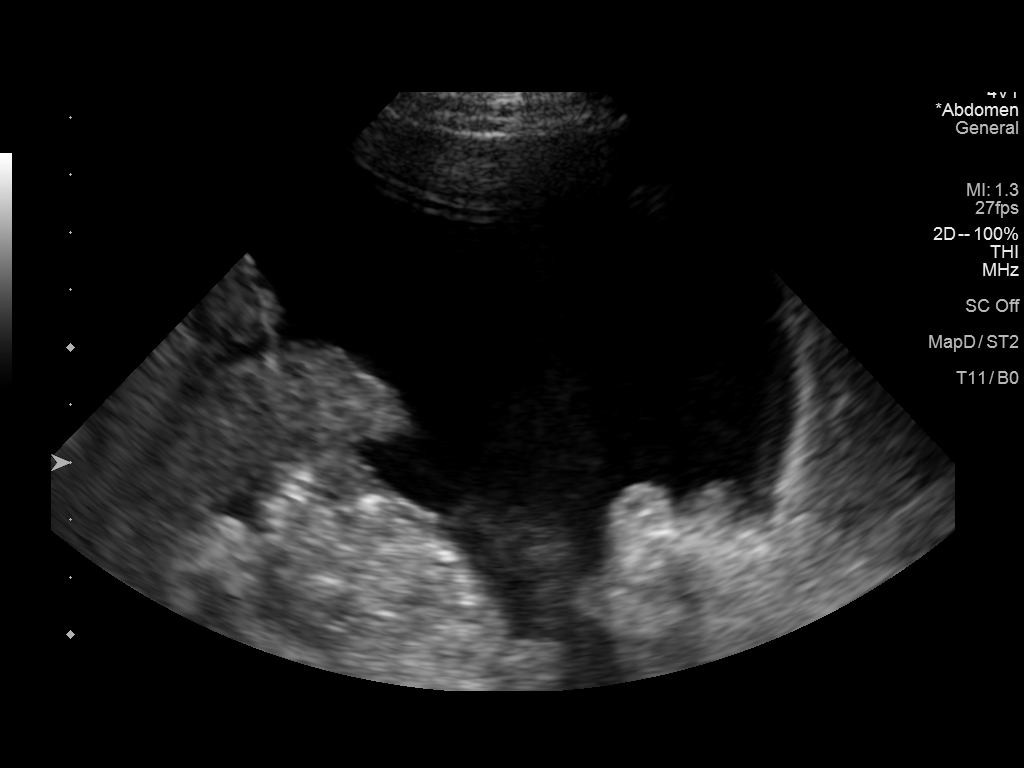

[3 of 3 positions shown; findings below may reference images not displayed]

EXAM:
ULTRASOUND GUIDED  PARACENTESIS

MEDICATIONS:
None.

COMPLICATIONS:
None immediate.

PROCEDURE:
The patient had a red somewhat leathery appearance to her lower
abdominal skin. The patient recently completed a course of oral
antibiotics. This area showed no weeping, tenderness, or warmth and
it is unclear if this is residual dermatosis or residual infection.
A window of peritoneal access in the right upper quadrant was
identified sonographically, far from the abnormally colored skin.
Follow-up with primary care physician was recommended and the
patient agreed.

Informed written consent was obtained from the patient after a
discussion of the risks, benefits and alternatives to treatment. A
timeout was performed prior to the initiation of the procedure.

The right upper abdomen was prepped and draped in the usual sterile
fashion. 1% lidocaine was used for local anesthesia.

Following this, a Yeuh catheter was introduced under direct
sonographic visualization given puncture location. The paracentesis
was performed. The catheter was removed and a dressing was applied.
The patient tolerated the procedure well without immediate post
procedural complication.
FINDINGS: A total of approximately 6.5 L of amber colored ascitic fluid was
removed.
IMPRESSION: Successful ultrasound-guided paracentesis yielding 6.5 liters of
peritoneal fluid.

## 2016-06-24 ENCOUNTER — Ambulatory Visit (HOSPITAL_COMMUNITY)
Admission: RE | Admit: 2016-06-24 | Discharge: 2016-06-24 | Disposition: A | Discharge status: Home / Self Care | Payer: Medicare Other | Source: Ambulatory Visit | Attending: Gastroenterology | Admitting: Gastroenterology

## 2016-06-24 ENCOUNTER — Other Ambulatory Visit: Payer: Self-pay

## 2016-06-24 ENCOUNTER — Encounter (HOSPITAL_COMMUNITY): Payer: Self-pay

## 2016-06-24 DIAGNOSIS — R188 Other ascites: Secondary | ICD-10-CM

## 2016-06-24 MED ORDER — ALBUMIN HUMAN 25 % IV SOLN
25.0000 g | Freq: Once | INTRAVENOUS | Status: AC
  Administered 2016-06-24: 25 g via INTRAVENOUS

## 2016-06-24 MED ORDER — ALBUMIN HUMAN 25 % IV SOLN
INTRAVENOUS | Status: AC
  Administered 2016-06-24: 25 g via INTRAVENOUS
  Filled 2016-06-24: qty 100

## 2016-06-24 NOTE — Procedures (Signed)
Uncomplicated US guided paracentesis for symptom control.  JWatts MD

## 2016-06-26 MED ORDER — LACTULOSE 10 GM/15ML PO SOLN: ORAL | 5 refills | Status: DC

## 2016-07-01 ENCOUNTER — Encounter (HOSPITAL_COMMUNITY): Payer: Self-pay

## 2016-07-01 ENCOUNTER — Ambulatory Visit (HOSPITAL_COMMUNITY)
Admission: RE | Admit: 2016-07-01 | Discharge: 2016-07-01 | Disposition: A | Discharge status: Home / Self Care | Payer: Medicare Other | Source: Ambulatory Visit | Attending: Gastroenterology | Admitting: Gastroenterology

## 2016-07-01 ENCOUNTER — Other Ambulatory Visit: Payer: Self-pay | Admitting: Gastroenterology

## 2016-07-01 DIAGNOSIS — R188 Other ascites: Secondary | ICD-10-CM | POA: Diagnosis present

## 2016-07-01 NOTE — Progress Notes (Signed)
Paracentesis complete no signs of distress. 1800 ml yellow colored ascites removed.

## 2016-07-01 NOTE — Procedures (Signed)
PreOperative Dx: Cirrhosis, ascites Postoperative Dx: Cirrhosis, ascites Procedure:   US guided paracentesis Radiologist:  Thornton Papas Anesthesia:  10 ml of1% lidocaine Specimen:  1.8 L of yellow ascitic fluid EBL:   < 1 ml Complications: None

## 2016-07-07 IMAGING — US US PARACENTESIS
1 series · 1 of 1 positions shown · non-contrast
Comparison: none

INDICATION: Ascites

[Series 1: us paracentesis · 0.25mm/px · 1 of 1 slices shown]
[im 1/1]
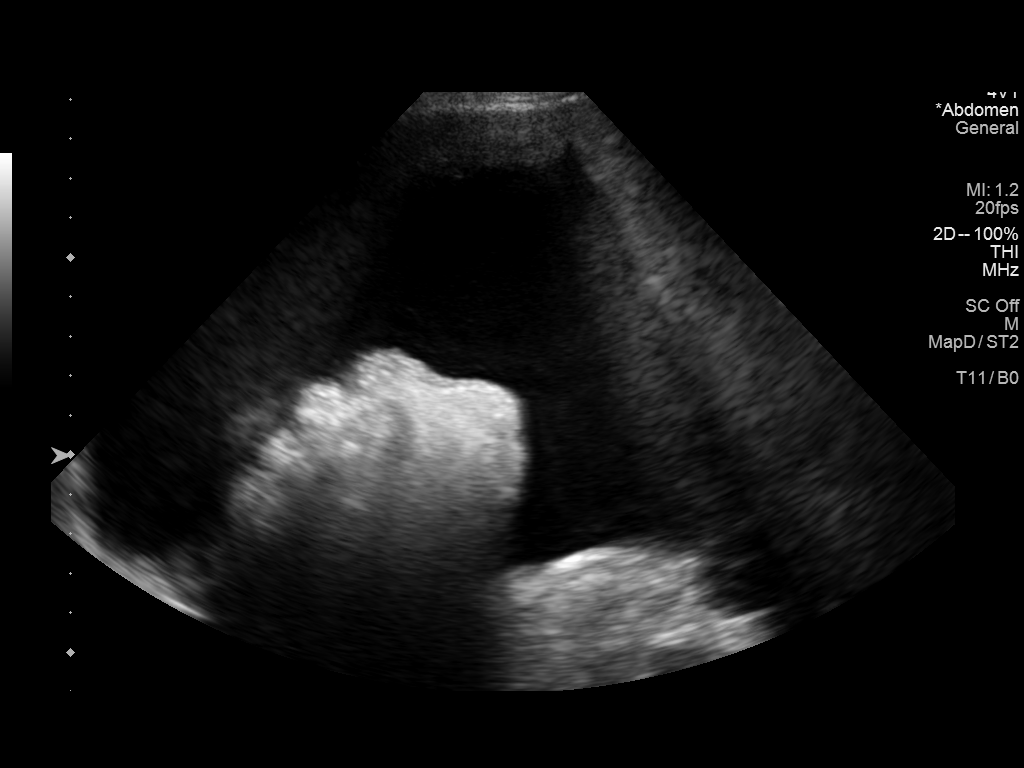

[1 of 1 positions shown; findings below may reference images not displayed]

EXAM:
ULTRASOUND GUIDED  PARACENTESIS

MEDICATIONS:
None.

COMPLICATIONS:
None immediate.

PROCEDURE:
Informed written consent was obtained from the patient after a
discussion of the risks, benefits and alternatives to treatment. A
timeout was performed prior to the initiation of the procedure.

Initial ultrasound scanning demonstrates a large amount of ascites
within the right lower abdominal quadrant. The right lower abdomen
was prepped and draped in the usual sterile fashion. 1% lidocaine
with epinephrine was used for local anesthesia.

Following this, a 19 gauge, 7-cm, Yueh catheter was introduced. An
ultrasound image was saved for documentation purposes. The
paracentesis was performed. The catheter was removed and a dressing
was applied. The patient tolerated the procedure well without
immediate post procedural complication.
FINDINGS: A total of approximately 5.1 L of clear yellow fluid was removed.
IMPRESSION: Successful ultrasound-guided paracentesis yielding 5.1 liters of
peritoneal fluid.

## 2016-07-08 ENCOUNTER — Ambulatory Visit (HOSPITAL_COMMUNITY)
Admission: RE | Admit: 2016-07-08 | Discharge: 2016-07-08 | Disposition: A | Discharge status: Home / Self Care | Payer: Medicare Other | Source: Ambulatory Visit | Attending: Gastroenterology | Admitting: Gastroenterology

## 2016-07-08 DIAGNOSIS — R188 Other ascites: Secondary | ICD-10-CM | POA: Diagnosis not present

## 2016-07-08 MED ORDER — ALBUMIN HUMAN 25 % IV SOLN
INTRAVENOUS | Status: AC
  Administered 2016-07-08: 25 g via INTRAVENOUS
  Filled 2016-07-08: qty 100

## 2016-07-08 MED ORDER — ALBUMIN HUMAN 25 % IV SOLN
25.0000 g | Freq: Once | INTRAVENOUS | Status: AC
  Administered 2016-07-08: 25 g via INTRAVENOUS

## 2016-07-08 NOTE — Procedures (Signed)
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

## 2016-07-08 NOTE — Progress Notes (Signed)
Paracentesis complete no signs of distress. 2500 ml ascites removed.

## 2016-07-09 NOTE — Progress Notes (Signed)
PT is aware. She said that she was going weekly because Dr. Oneida Alar had it set up for her to go weekly. She will try to wait longer.

## 2016-07-09 NOTE — Progress Notes (Signed)
Patient has went for 5 LVAPs this month. Most taps with less than 2 liters removed.  Please encourage patient to try to wait longer between taps.

## 2016-07-09 NOTE — Progress Notes (Signed)
LMOM for a return call.  

## 2016-07-14 ENCOUNTER — Emergency Department (HOSPITAL_COMMUNITY)
Admission: EM | Admit: 2016-07-14 | Discharge: 2016-07-15 | Disposition: A | Discharge status: Home / Self Care | Payer: Medicare Other | Attending: Emergency Medicine | Admitting: Emergency Medicine

## 2016-07-14 ENCOUNTER — Encounter (HOSPITAL_COMMUNITY): Payer: Self-pay

## 2016-07-14 DIAGNOSIS — Z79899 Other long term (current) drug therapy: Secondary | ICD-10-CM | POA: Diagnosis not present

## 2016-07-14 DIAGNOSIS — E875 Hyperkalemia: Secondary | ICD-10-CM | POA: Diagnosis not present

## 2016-07-14 DIAGNOSIS — D696 Thrombocytopenia, unspecified: Secondary | ICD-10-CM | POA: Diagnosis not present

## 2016-07-14 DIAGNOSIS — Z87891 Personal history of nicotine dependence: Secondary | ICD-10-CM | POA: Diagnosis not present

## 2016-07-14 DIAGNOSIS — D631 Anemia in chronic kidney disease: Secondary | ICD-10-CM | POA: Insufficient documentation

## 2016-07-14 DIAGNOSIS — Z992 Dependence on renal dialysis: Secondary | ICD-10-CM | POA: Diagnosis not present

## 2016-07-14 DIAGNOSIS — N186 End stage renal disease: Secondary | ICD-10-CM | POA: Diagnosis not present

## 2016-07-14 DIAGNOSIS — R040 Epistaxis: Secondary | ICD-10-CM | POA: Diagnosis present

## 2016-07-14 DIAGNOSIS — Z91040 Latex allergy status: Secondary | ICD-10-CM | POA: Insufficient documentation

## 2016-07-14 LAB — PROTIME-INR
INR: 1.03
Prothrombin Time: 13.5 seconds (ref 11.4–15.2)

## 2016-07-14 LAB — COMPREHENSIVE METABOLIC PANEL
ALBUMIN: 3.2 g/dL — AB (ref 3.5–5.0)
ALT: 32 U/L (ref 14–54)
AST: 54 U/L — AB (ref 15–41)
Alkaline Phosphatase: 129 U/L — ABNORMAL HIGH (ref 38–126)
Anion gap: 21 — ABNORMAL HIGH (ref 5–15)
BUN: 97 mg/dL — AB (ref 6–20)
CHLORIDE: 90 mmol/L — AB (ref 101–111)
CO2: 19 mmol/L — AB (ref 22–32)
Calcium: 8.9 mg/dL (ref 8.9–10.3)
Creatinine, Ser: 12.7 mg/dL — ABNORMAL HIGH (ref 0.44–1.00)
GFR calc Af Amer: 4 mL/min — ABNORMAL LOW (ref >60)
GFR, EST NON AFRICAN AMERICAN: 3 mL/min — AB (ref >60)
GLUCOSE: 89 mg/dL (ref 65–99)
POTASSIUM: 6.2 mmol/L — AB (ref 3.5–5.1)
SODIUM: 130 mmol/L — AB (ref 135–145)
Total Bilirubin: 1.3 mg/dL — ABNORMAL HIGH (ref 0.3–1.2)
Total Protein: 7.1 g/dL (ref 6.5–8.1)

## 2016-07-14 LAB — CBC WITH DIFFERENTIAL/PLATELET
Basophils Absolute: 0.1 10*3/uL (ref 0.0–0.1)
Basophils Relative: 1 %
EOS PCT: 6 %
Eosinophils Absolute: 0.3 10*3/uL (ref 0.0–0.7)
HEMATOCRIT: 27.3 % — AB (ref 36.0–46.0)
HEMOGLOBIN: 8.6 g/dL — AB (ref 12.0–15.0)
LYMPHS ABS: 0.7 10*3/uL (ref 0.7–4.0)
LYMPHS PCT: 12 %
MCH: 30.6 pg (ref 26.0–34.0)
MCHC: 31.5 g/dL (ref 30.0–36.0)
MCV: 97.2 fL (ref 78.0–100.0)
Monocytes Absolute: 0.6 10*3/uL (ref 0.1–1.0)
Monocytes Relative: 10 %
Neutro Abs: 4.1 10*3/uL (ref 1.7–7.7)
Neutrophils Relative %: 71 %
Platelets: 91 10*3/uL — ABNORMAL LOW (ref 150–400)
RBC: 2.81 MIL/uL — AB (ref 3.87–5.11)
RDW: 17 % — ABNORMAL HIGH (ref 11.5–15.5)
Smear Review: DECREASED
WBC: 5.8 10*3/uL (ref 4.0–10.5)

## 2016-07-14 LAB — APTT: APTT: 29 s (ref 24–36)

## 2016-07-14 NOTE — ED Provider Notes (Signed)
[facility] DEPT Provider Note   CSN: [id number] Arrival date & time: 07/14/16  1705  By signing my name below, I, [name], attest that this documentation has been prepared under the direction and in the presence of physician practitioner, Rolland Porter, MD. Electronically Signed: [name], Scribe. 07/14/2016. 12:16 AM.  Time seen 12:10 AM  History   Chief Complaint Chief Complaint  Patient presents with  . Epistaxis    The history is provided by the patient. No language interpreter was used.     HPI Comments: JAONNA [name] is a 40 y.o. female who presents to the Emergency Department complaining of sudden onset, intermittent, epistaxis from her right nostril beginning yesterday morning. She states her bleeding has been heavy and light at times; she notes she has passed a few blood clots. She reports some blood is going down the back of her throat when she leans her head back and is making her nauseous. She reports some occasional sneezing lately. She notes a h/o sinus infection and associated epistaxis but states her current nosebleed is much more severe and prolonged than in the past. Pt is followed by [facility] for cirrhosis. She is a dialysis patient (renal failure) and was supposed to be dialyzed today but was unable to attend due to her nosebleed; she states she will be dialyzed as soon as possible. She denies fever, chills, rectal bleeding, hematemesis, vomiting, or any other associated symptoms.  PCP [facility] GI Dr Oneida Alar  Past Medical History:  Diagnosis Date  . Acute blood loss anemia 02/25/2014   Status post transfusion  . Acute renal failure ([place]) 09/2013   Pre-renal- resolved  . Anasarca 10/10/2013  . Anxiety   . Bipolar disorder ([place]) 12/04/2013   2007-SEEN IN ED FOR INVOLUNTARY COMMITMENT, UDS POS FOR AMPHETAMINES/OPIATES   . Bleeding esophageal varices ([place]) 02/28/2014   s/p banding  . C. difficile colitis 04/19/2014  . Chronic hypotension   .  Cirrhosis ([place]) 10/05/13   Liver bx 11/23/13 (delayed initially due to patient refusal). c/w steatohepatitis  . Cirrhosis of liver with ascites ([place])   . Depression   . 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 Folate deficiency 09/2013  . Gastroesophageal junction ulcer 09/17/2014  . GERD (gastroesophageal reflux disease)   . Hematemesis/vomiting blood 02/24/2014  . Macrocytosis 02/28/2014  . PNA (pneumonia) 10/13/2013  . SBP (spontaneous bacterial peritonitis) ([place]) 11/10/2013  . Thrombocytopenia ([place])    Hypercellular bone marrow; abundant megakaryocytes per 08/27/2014; s/p bone marrow bx    Patient Active Problem List   Diagnosis Date Noted  . Pressure injury of skin 05/20/2016  . Shock ([place]) 05/19/2016  . Mallory-Weiss tear   . Rhonchi   . Alcoholic cirrhosis of liver with ascites ([place])   . Chronic pain syndrome 08/22/2015  . ESRD (end stage renal disease) ([place]) 08/20/2015  . Abdominal wall cellulitis 08/20/2015  . Chronic abdominal pain 08/07/2015  . Umbilical hernia without obstruction and without gangrene 08/07/2015  . Liver cirrhosis secondary to NASH ([place])   . Acute blood loss anemia   . Hypotension 06/05/2015  . Edema of left lower extremity 02/22/2015  . UGIB (upper gastrointestinal bleed) 09/16/2014  . ESRD (end stage renal disease) on dialysis ([place])   . Melena   . Macrocytosis 09/12/2014  . Thrombocytopenia ([place]) 08/19/2014  . Esophageal varices without bleeding ([place])   . Portal hypertensive gastropathy   . Malnutrition of moderate degree ([place]) 05/15/2014  . Hepatic encephalopathy ([place]) 05/14/2014  .  C. difficile colitis 04/19/2014  . Bipolar disorder ([place]) 12/04/2013  . SBP (spontaneous bacterial peritonitis) ([place]) 11/10/2013  . Liver cirrhosis secondary to nonalcoholic steatohepatitis (NASH) ([place]) 11/07/2013  . PNA (pneumonia) 10/13/2013  . Folate deficiency 10/13/2013  . Ascites 09/11/2013    Past Surgical History:  Procedure Laterality Date    . AV FISTULA PLACEMENT Right 11/16/2014   Procedure: Right arm Creation of arteriovenous fistula;  Surgeon: Angelia Mould, MD;  Location: [facility];  Service: Vascular;  Laterality: Right;  . CENTRAL VENOUS CATHETER INSERTION Right   . COLONOSCOPY N/A 12/19/2013   SLF:NO OBVIOUS SOURCE FOR ANEMIA IDETIFIED/ONE COLON POLYP REMOVED/Small internal hemorrhoids  . ESOPHAGEAL BANDING  07/04/2014   Procedure: ESOPHAGEAL BANDING;  Surgeon: Daneil Dolin, MD;  [facility];  Service: Endoscopy;;  . ESOPHAGEAL BANDING N/A 07/24/2014   Procedure: ESOPHAGEAL BANDING (2 bands applied);  Surgeon: Danie Binder, MD;  Location: [facility];  Service: Endoscopy;  Laterality: N/A;  . ESOPHAGEAL BANDING N/A 07/23/2015   Procedure: ESOPHAGEAL BANDING;  Surgeon: Danie Binder, MD;  [facility];  Service: Endoscopy;  Laterality: N/A;  . ESOPHAGEAL BANDING N/A 03/04/2016   Procedure: ESOPHAGEAL BANDING;  Surgeon: Danie Binder, MD;  [facility];  Service: Endoscopy;  Laterality: N/A;  . ESOPHAGEAL BANDING N/A 04/30/2016   Procedure: ESOPHAGEAL BANDING;  Surgeon: Daneil Dolin, MD;  [facility];  Service: Endoscopy;  Laterality: N/A;  . ESOPHAGOGASTRODUODENOSCOPY N/A 11/14/2013   SLF:1 column of very small varices in distal esopahgus/MODERATE PORTAL GASTROPATHY IN PROXIMAL STOMACH/MODERATE erosive gastritis  . ESOPHAGOGASTRODUODENOSCOPY N/A 02/11/2014   Dr. Rourk:Esophageal varices with bleeding stigmata-status post esophageal band ligation therapy. Portal gastropathy  . ESOPHAGOGASTRODUODENOSCOPY N/A 07/04/2014   RMR: Persiting grade 2 esophageal varicies with bleeding stigmata status post band ligation. Significantly congested gastric mucosa iwith changes constistant with protal gastropathy.   . ESOPHAGOGASTRODUODENOSCOPY N/A 09/16/2014   Rehman: Single short column of varix proximal to GE junction not large enough to be banded. Two amall ulcers at the GEJ felt to be source  of GI Bleeding but no active bleeding but no actibe bleeding noted. No therapy rendered. Portal gastropathy NO evidence of peptic ulcer diease or gastric varices.   . ESOPHAGOGASTRODUODENOSCOPY N/A 12/14/2014   SLF: Grade ! esophageal varices. 2. Moderate Portal Gastropathy  . ESOPHAGOGASTRODUODENOSCOPY N/A 03/27/2015   Procedure: ESOPHAGOGASTRODUODENOSCOPY (EGD);  Surgeon: Danie Binder, MD;  [facility];  Service: Endoscopy;  Laterality: N/A;  1045am - moved to 817 @ 11:30  . ESOPHAGOGASTRODUODENOSCOPY N/A 06/05/2015   Procedure: ESOPHAGOGASTRODUODENOSCOPY (EGD);  Surgeon: Clarene Essex, MD;  [facility];  Service: Endoscopy;  Laterality: N/A;  . ESOPHAGOGASTRODUODENOSCOPY N/A 07/23/2015   SLF:1. Grade 1 esophageal varices 2. Moderate portal hypertensive gastropathy 3. MILd non-erosive gastritis.   [name] ESOPHAGOGASTRODUODENOSCOPY N/A 03/04/2016   Dr. Oneida Alar: grade 1 varices, surveillance in Jan 2017   . ESOPHAGOGASTRODUODENOSCOPY N/A 05/21/2016   Procedure: ESOPHAGOGASTRODUODENOSCOPY (EGD);  Surgeon: Gatha Mayer, MD;  Location: [facility] ENDOSCOPY;  Service: Endoscopy;  Laterality: N/A;  . ESOPHAGOGASTRODUODENOSCOPY (EGD) WITH PROPOFOL N/A 07/24/2014   SLF:  1. 2 columns grade 2-3 varices- 2 Bands applied.  2.  Moderate gastropathy 3. Duodenal Diverticula  . ESOPHAGOGASTRODUODENOSCOPY (EGD) WITH PROPOFOL N/A 10/26/2014   RMR: 2 columns of grade 2 esophageal varices without obvious bleeding stigmata status post band ligation to complet obliteration of remaining varices.   . ESOPHAGOGASTRODUODENOSCOPY (EGD) WITH PROPOFOL N/A 04/30/2016  Procedure: ESOPHAGOGASTRODUODENOSCOPY (EGD) WITH PROPOFOL;  Surgeon: Daneil Dolin, MD;  [facility];  Service: Endoscopy;  Laterality: N/A;  . None    . PARACENTESIS  Feb 2015   1180 fluid, negative fluid analysis.   [name] PARACENTESIS  10/2013    OB History    Gravida Para Term Preterm AB Living   1 1 1     1    SAB TAB Ectopic Multiple  Live Births                   Home Medications    Prior to Admission medications   Medication Sig Start Date End Date Taking? Authorizing Provider  clotrimazole (LOTRIMIN) 1 % cream APPLY TOPICALLY THREE TIMES DAILY FOR 10 DAYS 07/07/16  Yes Annitta Needs, NP  Darbepoetin Alfa (ARANESP) 60 MCG/0.3ML SOSY injection Inject 0.3 mLs (60 mcg total) into the vein every Thursday with hemodialysis. Patient taking differently: Inject 60 mcg into the vein every Tuesday with hemodialysis.  06/09/15  Yes Cherene Altes, MD  lactulose (CHRONULAC) 10 GM/15ML solution TAKE 3 TEASPOONSFUL (15ML) BY MOUTH TWICE DAILY Patient taking differently: Take 10 g by mouth 2 (two) times daily. TAKE 3 TEASPOONSFUL (15ML) BY MOUTH TWICE DAILY 06/26/16  Yes Mahala Menghini, PA-C  lidocaine (XYLOCAINE) 2 % solution TAKE TWO TEASPOONSFUL (10ML) BY MOUTH BEFORE MEALS AND AT BEDTIME TO PREVENT CHEST PAIN WHILE EATING 03/19/16  Yes Carlis Stable, NP  lidocaine-prilocaine (EMLA) cream Apply 1 application topically as needed (for dialysis treatments).   Yes Historical Provider, MD  LORazepam (ATIVAN) 1 MG tablet TAKE 1/2 TO 1 TABLET BY MOUTH TWICE DAILY AS NEEDED FOR BACK SPASM OR FOR ANXIETY Patient taking differently: Take 0.5-1 mg by mouth 2 (two) times daily as needed for anxiety (or back spasms).  01/29/16  Yes Danie Binder, MD  midodrine (PROAMATINE) 10 MG tablet Take 1 tablet (10 mg total) by mouth daily. *Takes only on dialysis days on Tuesdays, Thursdays, and Saturdays (Sometimes takes treatments on Friday instead of Saturday)**Medication to keep your blood pressure from falling. Patient taking differently: Take 10 mg by mouth See admin instructions. Take 10 mg by mouth on dialysis days on Tuesdays, Thursdays, and Saturdays (Sometimes takes treatments on Friday instead of Saturday) 06/09/15  Yes Cherene Altes, MD  Oxycodone HCl 10 MG TABS 1 PO TID AS NEEDED FOR PAIN Patient taking differently: Take 10 mg by mouth 3  (three) times daily as needed. For pain 02/05/16  Yes Danie Binder, MD  pantoprazole (PROTONIX) 20 MG tablet Take 1 tablet (20 mg total) by mouth 2 (two) times daily. 05/27/16  Yes Belkys A Regalado, MD  RENVELA 800 MG tablet Take 1600 mg by mouth 3 times daily with meals. Take 800 mg by mouth with snacks. 10/29/15  Yes Historical Provider, MD  sucralfate (CARAFATE) 1 GM/10ML suspension Take 10 mLs (1 g total) by mouth 4 (four) times daily -  with meals and at bedtime. 05/27/16  Yes Belkys A Regalado, MD  rifaximin (XIFAXAN) 550 MG TABS tablet Take 1 tablet (550 mg total) by mouth 2 (two) times daily before a meal. Patient not taking: Reported on 07/14/2016 05/27/16   Elmarie Shiley, MD    Family History Family History  Problem Relation Age of Onset  . Heart disease Mother   . Colon cancer Neg Hx   . Liver disease Neg Hx     Social History Social History  Substance Use Topics  .  Smoking status: Former Smoker    Packs/day: 0.25    Years: 20.00    Types: Cigarettes    Quit date: 11/05/2008  . Smokeless tobacco: Never Used     Comment: Never really smoked much  . Alcohol use No     Allergies   Lasix [furosemide] and Latex   Review of Systems Review of Systems  Constitutional: Negative for chills and fever.  HENT: Positive for nosebleeds and sneezing.   Gastrointestinal: Positive for nausea. Negative for anal bleeding and vomiting.       Negative for hematemesis.  All other systems reviewed and are negative.    Physical Exam Updated Vital Signs BP 107/57   Pulse 96   Temp 97 F (36.1 C) (Oral)   Resp 19   Ht 5\' 1"  (1.549 m)   Wt 160 lb (72.6 kg)   LMP 09/19/2013   SpO2 99%   BMI 30.23 kg/m   Vital signs normal    Physical Exam  Constitutional: She is oriented to person, place, and time. She appears well-developed and well-nourished.  Non-toxic appearance. She does not appear ill. No distress.  HENT:  Head: Normocephalic and atraumatic.  Right Ear:  External ear normal.  Left Ear: External ear normal.  Nose: No mucosal edema or rhinorrhea. Epistaxis is observed.  Mouth/Throat: Oropharynx is clear and moist and mucous membranes are normal. No dental abscesses or uvula swelling.  Right nostril: area in the anterior septum that appears to be the source of bleeding  Eyes: Conjunctivae and EOM are normal. Pupils are equal, round, and reactive to light.  Neck: Normal range of motion and full passive range of motion without pain. Neck supple.  Cardiovascular: Normal rate, regular rhythm and normal heart sounds.  Exam reveals no gallop and no friction rub.   No murmur heard. Pulmonary/Chest: Effort normal and breath sounds normal. No respiratory distress. She has no wheezes. She has no rhonchi. She has no rales. She exhibits no tenderness and no crepitus.  Abdominal: Soft. Normal appearance and bowel sounds are normal. She exhibits no distension. There is no tenderness. There is no rebound and no guarding.  Musculoskeletal: Normal range of motion. She exhibits no edema or tenderness.  Moves all extremities well.   Neurological: She is alert and oriented to person, place, and time. She has normal strength. No cranial nerve deficit.  Skin: Skin is warm, dry and intact. No rash noted. No erythema. No pallor.  Psychiatric: She has a normal mood and affect. Her speech is normal and behavior is normal. Her mood appears not anxious.  Nursing note and vitals reviewed.    ED Treatments / Results  Labs (all labs ordered are listed, but only abnormal results are displayed) Results for orders placed or performed during the hospital encounter of 07/14/16  Comprehensive metabolic panel  Result Value Ref Range   Sodium 130 (L) 135 - 145 mmol/L   Potassium 6.2 (H) 3.5 - 5.1 mmol/L   Chloride 90 (L) 101 - 111 mmol/L   CO2 19 (L) 22 - 32 mmol/L   Glucose, Bld 89 65 - 99 mg/dL   BUN 97 (H) 6 - 20 mg/dL   Creatinine, Ser 12.70 (H) 0.44 - 1.00 mg/dL    Calcium 8.9 8.9 - 10.3 mg/dL   Total Protein 7.1 6.5 - 8.1 g/dL   Albumin 3.2 (L) 3.5 - 5.0 g/dL   AST 54 (H) 15 - 41 U/L   ALT 32 14 - 54 U/L  Alkaline Phosphatase 129 (H) 38 - 126 U/L   Total Bilirubin 1.3 (H) 0.3 - 1.2 mg/dL   GFR calc non Af Amer 3 (L) >60 mL/min   GFR calc Af Amer 4 (L) >60 mL/min   Anion gap 21 (H) 5 - 15  CBC with Differential  Result Value Ref Range   WBC 5.8 4.0 - 10.5 K/uL   RBC 2.81 (L) 3.87 - 5.11 MIL/uL   Hemoglobin 8.6 (L) 12.0 - 15.0 g/dL   HCT 27.3 (L) 36.0 - 46.0 %   MCV 97.2 78.0 - 100.0 fL   MCH 30.6 26.0 - 34.0 pg   MCHC 31.5 30.0 - 36.0 g/dL   RDW 17.0 (H) 11.5 - 15.5 %   Platelets 91 (L) 150 - 400 K/uL   Neutrophils Relative % 71 %   Neutro Abs 4.1 1.7 - 7.7 K/uL   Lymphocytes Relative 12 %   Lymphs Abs 0.7 0.7 - 4.0 K/uL   Monocytes Relative 10 %   Monocytes Absolute 0.6 0.1 - 1.0 K/uL   Eosinophils Relative 6 %   Eosinophils Absolute 0.3 0.0 - 0.7 K/uL   Basophils Relative 1 %   Basophils Absolute 0.1 0.0 - 0.1 K/uL   RBC Morphology ELLIPTOCYTES    Smear Review PLATELETS APPEAR DECREASED   Protime-INR  Result Value Ref Range   Prothrombin Time 13.5 11.4 - 15.2 seconds   INR 1.03   APTT  Result Value Ref Range   aPTT 29 24 - 36 seconds   Laboratory interpretation all normal except Improved thrombocytopenia, stable anemia, chronic renal failure, hyperkalemia   EKG  EKG Interpretation  Date/Time:  Wednesday July 15 2016 00:37:00 EST Ventricular Rate:  101 PR Interval:    QRS Duration: 88 QT Interval:  332 QTC Calculation: 431 R Axis:   86 Text Interpretation:  Sinus tachycardia RAE, consider biatrial enlargement Electrode noise No significant change since last tracing 19 May 2016 Confirmed by [facility]  MD-I, Jaslyn Bansal ([id number]) on 07/15/2016 2:18:47 AM       Radiology No results found.  Procedures Procedures (including critical care time)  Medications Ordered in ED Medications  silver nitrate applicators applicator 1  application (1 application Topical Given 07/15/16 0133)  oxymetazoline (AFRIN) 0.05 % nasal spray 1 spray (1 spray Right Nare Given 07/15/16 0132)  sodium polystyrene (KAYEXALATE) 15 GM/60ML suspension 45 g (45 g Oral Given 07/15/16 0408)     Initial Impression / Assessment and Plan / ED Course  I have reviewed the triage vital signs and the nursing notes.  Pertinent labs & imaging results that were available during my care of the patient were reviewed by me and considered in my medical decision making (see chart for details).  Clinical Course    DIAGNOSTIC STUDIES: Oxygen Saturation is 99% on RA, normal by my interpretation.    COORDINATION OF CARE: 12:23 AM Discussed treatment plan with pt at bedside and pt agreed to plan.  Laboratory testing was done.  Afrin nasal spray was sprayed in her right nostril with controlled bleeding. At 3 AM I used a silver nitrate stick to cauterize the dilated Or blood vessels on her right anterior nasal septum. Afterwards patient was ambulatory without rebleeding. She was given oral Kayexalate for her hyperkalemia. She denies chest pain or shortness of breath. Her EKG does not have hyperacute T-wave consistent with high potassium. She was to get dialyzed today.   Final Clinical Impressions(s) / ED Diagnoses   Final diagnoses:  Right-sided epistaxis  Anemia in chronic kidney disease, on chronic dialysis (HCC)  Thrombocytopenia ([place])  Hyperkalemia    Plan discharge  Rolland Porter, MD, Barbette Or, MD 07/15/16 [phone]

## 2016-07-14 NOTE — ED Triage Notes (Addendum)
Patient reports of nose bleed that started this morning. Reports of a burning in nose. Small amount of blood noted on tissue in triage.  Denies any other symptoms.

## 2016-07-15 ENCOUNTER — Ambulatory Visit (HOSPITAL_COMMUNITY)
Admission: RE | Admit: 2016-07-15 | Discharge: 2016-07-15 | Disposition: A | Discharge status: Home / Self Care | Payer: Medicare Other | Source: Ambulatory Visit | Attending: Gastroenterology | Admitting: Gastroenterology

## 2016-07-15 ENCOUNTER — Other Ambulatory Visit: Payer: Self-pay | Admitting: Gastroenterology

## 2016-07-15 DIAGNOSIS — R188 Other ascites: Secondary | ICD-10-CM | POA: Insufficient documentation

## 2016-07-15 MED ORDER — SODIUM POLYSTYRENE SULFONATE 15 GM/60ML PO SUSP
45.0000 g | Freq: Once | ORAL | Status: AC
  Administered 2016-07-15: 45 g via ORAL
  Filled 2016-07-15: qty 180

## 2016-07-15 MED ORDER — SILVER NITRATE-POT NITRATE 75-25 % EX MISC
1.0000 "application " | Freq: Once | CUTANEOUS | Status: AC
  Administered 2016-07-15: 1 via TOPICAL
  Filled 2016-07-15: qty 1

## 2016-07-15 MED ORDER — OXYMETAZOLINE HCL 0.05 % NA SOLN
1.0000 | Freq: Once | NASAL | Status: AC
  Administered 2016-07-15: 1 via NASAL
  Filled 2016-07-15: qty 15

## 2016-07-15 NOTE — ED Notes (Signed)
Pt given diet ginerale and pb crackers.

## 2016-07-15 NOTE — Discharge Instructions (Signed)
If your nose rebleeds, put an ice pack on your forehead near the bridge of your nose. Use the afrin spray in the nostril that's bleeding. Squeeze your nose like a old fashioned clothes pin and hold pressure for 5-10 minutes. Return if it doesn't stop bleeding. YOU NEED TO GET DIALYSIS TODAY, YOUR POTASSIUM LEVEL IS HIGH!!!

## 2016-07-15 NOTE — Progress Notes (Signed)
Patient went back for LVAP in one week even after we advised her to wait longer. She did not have enough ascites to draw.  We need to change her standing order please.  She can only go for LVAP every two weeks. Rest of the orders pertaining to any labs or albumin should stay the same. See me if questions.

## 2016-07-15 NOTE — ED Notes (Signed)
EDP Tomi Bamberger made aware that the supplies she needed were in the pt's room.

## 2016-07-15 NOTE — ED Notes (Signed)
Pt ambulated around the nurses station without any incident.

## 2016-07-16 NOTE — Progress Notes (Signed)
LMOM for a return call.  

## 2016-07-20 ENCOUNTER — Other Ambulatory Visit: Payer: Self-pay | Admitting: Gastroenterology

## 2016-07-20 DIAGNOSIS — R188 Other ascites: Secondary | ICD-10-CM

## 2016-07-20 NOTE — Progress Notes (Signed)
LMOM to call. Also mailing a letter that we are changing her orders for every 2 weeks for the paracentesis.   Forwarding to [facility] to change the standing orders to once every two weeks.

## 2016-07-20 NOTE — Progress Notes (Signed)
Forwarding to [facility] to change the standing orders.

## 2016-07-22 ENCOUNTER — Encounter (HOSPITAL_COMMUNITY): Payer: Self-pay

## 2016-07-22 ENCOUNTER — Ambulatory Visit: Payer: Medicare Other | Admitting: Gastroenterology

## 2016-07-22 ENCOUNTER — Ambulatory Visit (HOSPITAL_COMMUNITY)
Admission: RE | Admit: 2016-07-22 | Discharge: 2016-07-22 | Disposition: A | Discharge status: Home / Self Care | Payer: Medicare Other | Source: Ambulatory Visit | Attending: Gastroenterology | Admitting: Gastroenterology

## 2016-07-30 ENCOUNTER — Other Ambulatory Visit: Payer: Self-pay

## 2016-07-30 DIAGNOSIS — R188 Other ascites: Secondary | ICD-10-CM

## 2016-07-31 ENCOUNTER — Other Ambulatory Visit: Payer: Self-pay | Admitting: Gastroenterology

## 2016-07-31 ENCOUNTER — Ambulatory Visit (HOSPITAL_COMMUNITY)
Admission: RE | Admit: 2016-07-31 | Discharge: 2016-07-31 | Disposition: A | Discharge status: Home / Self Care | Payer: Medicare Other | Source: Ambulatory Visit | Attending: Gastroenterology | Admitting: Gastroenterology

## 2016-07-31 ENCOUNTER — Encounter (HOSPITAL_COMMUNITY): Payer: Self-pay

## 2016-07-31 DIAGNOSIS — R188 Other ascites: Secondary | ICD-10-CM

## 2016-07-31 NOTE — Progress Notes (Signed)
Patient has been for LVAP twice in the last 3 weeks and has had minimal ascites.  Please make sure she is aware that LVAP are not required. If her abdomen is not significantly distended, tight, does not have difficulty eating/breathing, she does not have to be tapped.

## 2016-08-05 NOTE — Progress Notes (Signed)
LMOM to call.

## 2016-08-06 ENCOUNTER — Telehealth: Payer: Self-pay

## 2016-08-06 NOTE — Telephone Encounter (Signed)
Refill request sent from [facility] for refill on oxycodone.  #90 last filled on 07/14/2016.

## 2016-08-06 NOTE — Progress Notes (Signed)
LMOM to call and also mailing a letter to call.

## 2016-08-06 NOTE — Telephone Encounter (Signed)
Rx's written and instructed. Copies made for file.

## 2016-08-06 NOTE — Telephone Encounter (Signed)
Per [name], pt will pick up after dialysis.

## 2016-08-06 NOTE — Telephone Encounter (Signed)
REFILL ATIVAN TID PRN #[id number]  REFILL OXYCODONE #90 FROM DEC 28-2017 THRU Jan 04, 2017.

## 2016-08-06 NOTE — Telephone Encounter (Signed)
Also, requesting refill on Lorazepam. Last filled on 06/15/2016.

## 2016-08-12 ENCOUNTER — Ambulatory Visit (HOSPITAL_COMMUNITY): Admission: RE | Admit: 2016-08-12 | Payer: Medicare Other | Source: Ambulatory Visit

## 2016-08-13 NOTE — Telephone Encounter (Addendum)
Pharmacist from [facility] called- they cannot accept the oxycodone rx's. They can only take them for 3 months at a time and they have to be signed by the MD. They cannot take stamped rx's. She said they will need all new rx's written and signed by SLF.

## 2016-08-14 ENCOUNTER — Other Ambulatory Visit: Payer: Self-pay | Admitting: Gastroenterology

## 2016-08-17 NOTE — Telephone Encounter (Signed)
Forwarding to Dr. Oneida Alar to write the prescriptions.

## 2016-08-18 ENCOUNTER — Telehealth: Payer: Self-pay

## 2016-08-18 MED ORDER — LACTULOSE 10 GM/15ML PO SOLN: ORAL | 5 refills | Status: DC

## 2016-08-18 NOTE — Telephone Encounter (Signed)
Done

## 2016-08-18 NOTE — Telephone Encounter (Signed)
Pt left Vm that she has refills on her Lactulose, but will need a refill early. She sometimes has to take it three times a day instead of two times daily, if she eats more. She needs Rx sent in and wondered if it could be written for tid. Please advise!  Her phone is not working and her call back number for now is [phone].   Sending to the refill box.

## 2016-08-19 MED ORDER — OXYCODONE HCL 10 MG PO TABS: ORAL_TABLET | ORAL | 0 refills | Status: DC

## 2016-08-19 NOTE — Telephone Encounter (Signed)
I tried the phone numbers on file and could not get pt or Wanda [name]. I called the number she gave [place] yesterday that she is using for awhile, [phone], and could not leave Vm.

## 2016-08-19 NOTE — Telephone Encounter (Signed)
PLEASE CALL PT. Rx are ready to pick up-oxycodone #90 Jan 10, Feb 10, Mar 10.

## 2016-08-19 NOTE — Addendum Note (Signed)
Addended by: Barney Drain L on: 08/19/2016 04:00 PM   Modules accepted: Orders

## 2016-08-19 NOTE — Telephone Encounter (Signed)
The prescriptions are at front for pick up if pt comes by or calls.

## 2016-08-20 NOTE — Telephone Encounter (Signed)
Mailed letter for pt to come by and pick up the prescriptions.

## 2016-08-21 ENCOUNTER — Telehealth: Payer: Self-pay

## 2016-08-21 NOTE — Telephone Encounter (Signed)
Pt left Vm that she needs the Rx's re-written for the Oxycodone and she needs prescription for the Lotrimin cream.   I called her and told her we have her prescriptions at the front for the Oxycodone and the Lotrimin was sent in on 08/17/2016. [phone]).

## 2016-08-30 IMAGING — US US PARACENTESIS
1 series · 4 of 4 positions shown · non-contrast
Comparison: none

INDICATION: Cirrhosis, ascites

[Series 1: us paracentesis · 0.25mm/px · 4 of 4 slices shown]
[im 1/4]
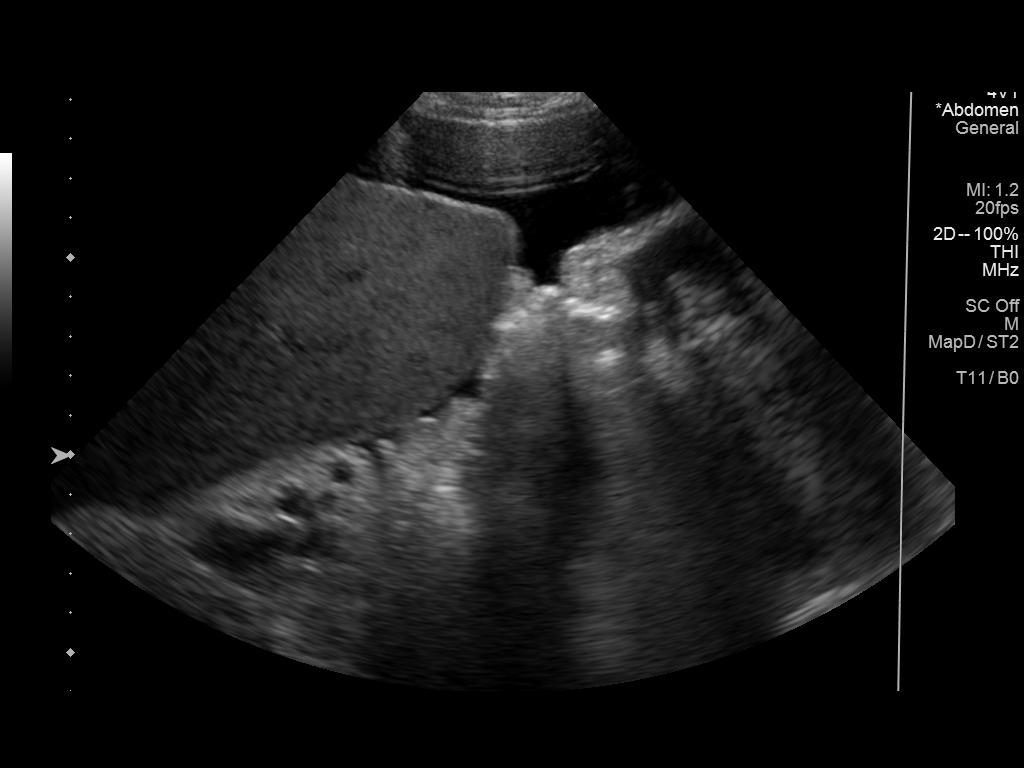
[im 2/4]
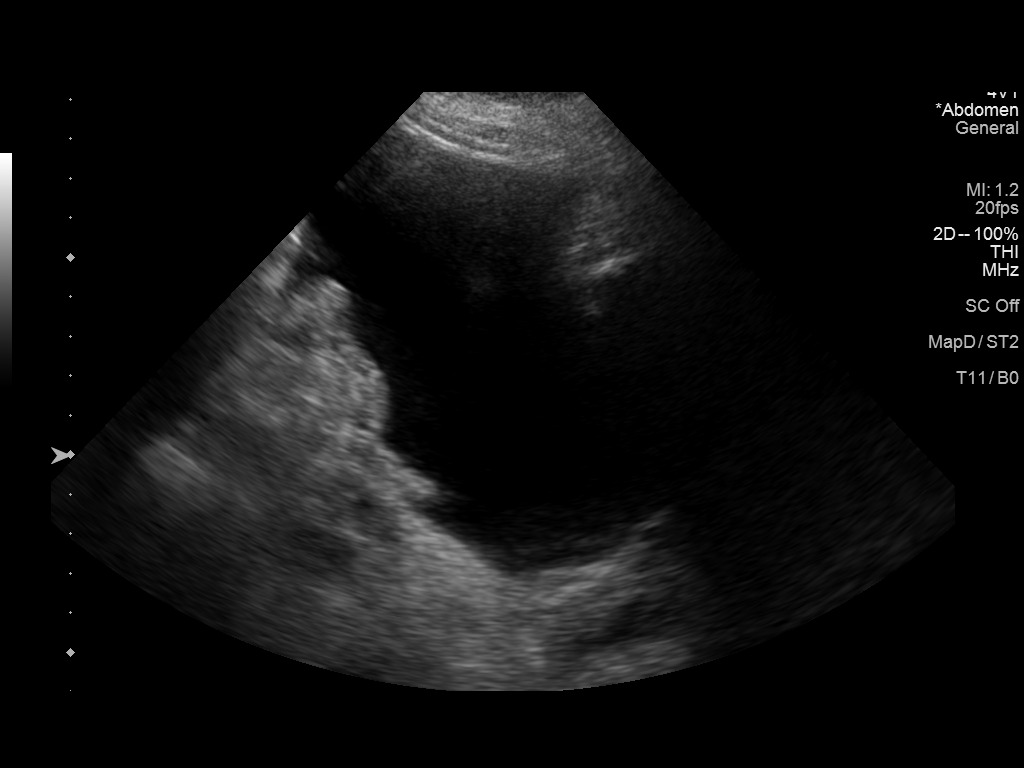
[im 3/4]
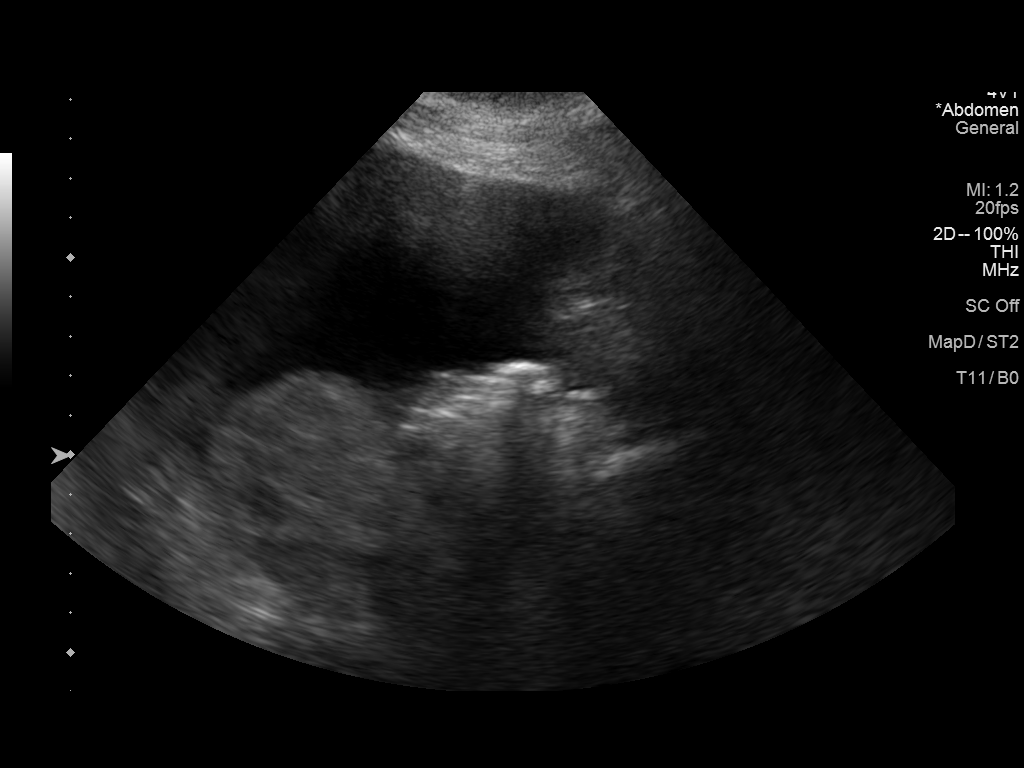
[im 4/4]
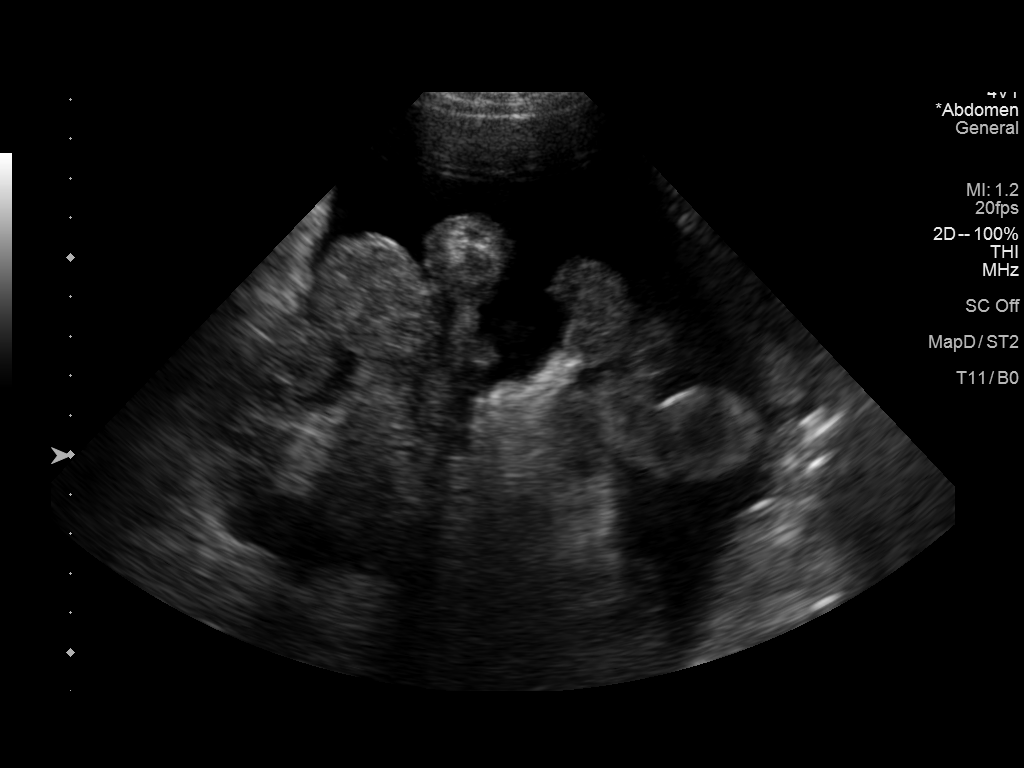

[4 of 4 positions shown; findings below may reference images not displayed]

EXAM:
ULTRASOUND GUIDED THERAPEUTIC PARACENTESIS

MEDICATIONS:
None.

COMPLICATIONS:
NONE

PROCEDURE:
Procedure, benefits, and risks of procedure were discussed with
patient.

Written informed consent for procedure was obtained.

Time out protocol followed.

Adequate collection of ascites localized by ultrasound in LEFT lower
quadrant.

Skin prepped and draped in usual sterile fashion.

Skin and soft tissues anesthetized with 10 mL of 1% lidocaine.

5 French Yueh catheter placed into peritoneal cavity.

5.2 L of dark yellow fluid aspirated by vacuum bottle suction.

Procedure tolerated well by patient without immediate complication.
FINDINGS: A total of approximately 5.2 L of ascitic fluid was removed.
IMPRESSION: Successful ultrasound-guided paracentesis yielding 5.2 liters of
peritoneal fluid.

## 2016-09-02 ENCOUNTER — Ambulatory Visit: Payer: Medicare Other | Admitting: Gastroenterology

## 2016-09-02 ENCOUNTER — Telehealth: Payer: Self-pay | Admitting: Gastroenterology

## 2016-09-02 ENCOUNTER — Other Ambulatory Visit: Payer: Self-pay

## 2016-09-02 DIAGNOSIS — K7031 Alcoholic cirrhosis of liver with ascites: Secondary | ICD-10-CM

## 2016-09-02 NOTE — Telephone Encounter (Signed)
Pt's boyfriend called to say that she was running late and would need to reschedule her OV for today with [facility]. He is aware of new OV. He called back to say that she is needing a para that she hasn't had one in awhile and she doesn't have standing orders and could she get standing orders to have a para done. Please advise and call him back [name]) [phone]

## 2016-09-02 NOTE — Telephone Encounter (Signed)
Para scheduled for tomorrow at 2:15 pm, arrive at [facility] at 2:00 pm. LMOVM and informed her boyfriend.

## 2016-09-02 NOTE — Telephone Encounter (Signed)
Standing order for six months for abdominal paracentesis, u/s guided. Give albumin 25 grams IV for every 4 liters ascites removed.  Send fluid for cell count with diff, fluid cultures.

## 2016-09-02 NOTE — Telephone Encounter (Signed)
[name], please advise for orders for para. We do not have a current set.

## 2016-09-02 NOTE — Progress Notes (Deleted)
   Subjective:    Patient ID: Wanda [name], female    DOB: September 02, 1975, 40 y.o.   MRN: [id number]  HPI    Review of Systems     Objective:   Physical Exam        Assessment & Plan:

## 2016-09-03 ENCOUNTER — Ambulatory Visit (HOSPITAL_COMMUNITY)
Admission: RE | Admit: 2016-09-03 | Discharge: 2016-09-03 | Disposition: A | Discharge status: Home / Self Care | Payer: Medicare Other | Source: Ambulatory Visit | Attending: Gastroenterology | Admitting: Gastroenterology

## 2016-09-03 DIAGNOSIS — K7031 Alcoholic cirrhosis of liver with ascites: Secondary | ICD-10-CM | POA: Diagnosis present

## 2016-09-03 LAB — BODY FLUID CELL COUNT WITH DIFFERENTIAL
Eos, Fluid: 0 %
LYMPHS FL: 53 %
Monocyte-Macrophage-Serous Fluid: 31 % — ABNORMAL LOW (ref 50–90)
Neutrophil Count, Fluid: 16 % (ref 0–25)
Other Cells, Fluid: 0 %
WBC FLUID: 243 uL (ref 0–1000)

## 2016-09-03 LAB — GRAM STAIN

## 2016-09-03 NOTE — Progress Notes (Signed)
Paracentesis complete no signs of distress. 2200 ml amber ascites removed.

## 2016-09-04 LAB — PATHOLOGIST SMEAR REVIEW

## 2016-09-06 IMAGING — US US PARACENTESIS
1 series · 3 of 3 positions shown · non-contrast
Comparison: none

INDICATION: Cirrhosis, ascites

[Series 1: us paracentesis · 0.24mm/px · 3 of 3 slices shown]
[im 1/3]
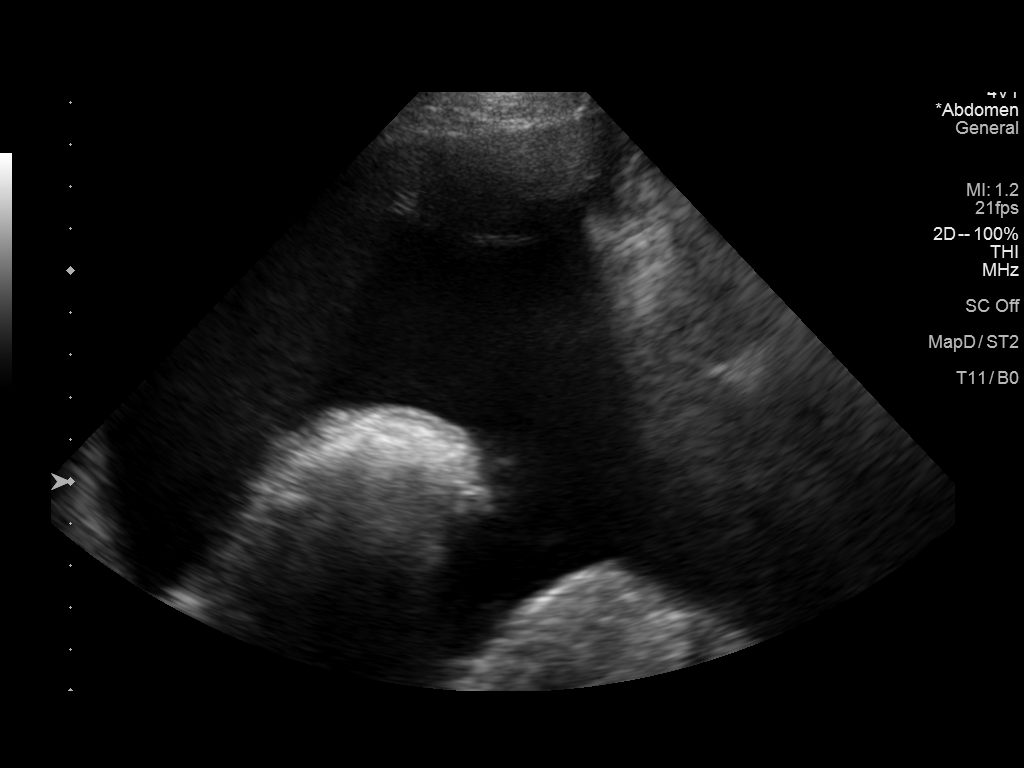
[im 2/3]
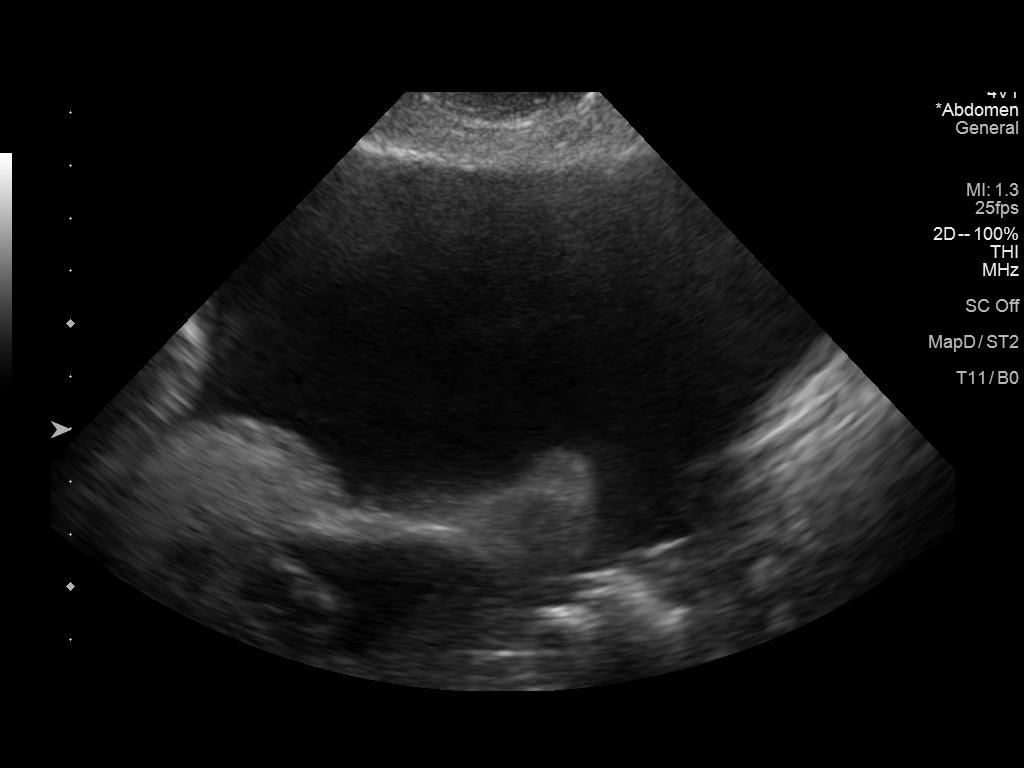
[im 3/3]
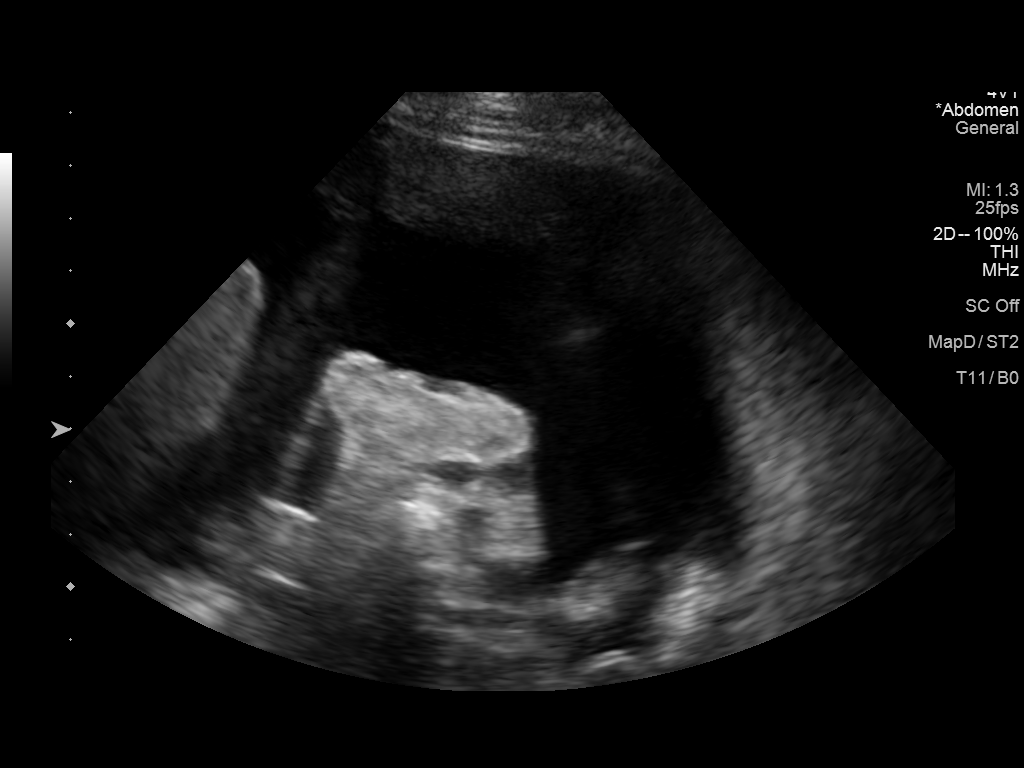

[3 of 3 positions shown; findings below may reference images not displayed]

EXAM:
ULTRASOUND GUIDED THERAPEUTIC PARACENTESIS

MEDICATIONS:
None.

COMPLICATIONS:
None immediate.

PROCEDURE:
Procedure, benefits, and risks of procedure were discussed with
patient.

Written informed consent for procedure was obtained.

Time out protocol followed.

Adequate collection of ascites localized by ultrasound in RIGHT
lower quadrant.

Skin prepped and draped in usual sterile fashion.

Skin and soft tissues anesthetized with 10 mL of 1% lidocaine.

5 French Yueh catheter placed into peritoneal cavity.

4.5 L of amber colored fluid aspirated by vacuum bottle suction.

Procedure tolerated well by patient without immediate complication.
FINDINGS: As above
IMPRESSION: Successful ultrasound-guided paracentesis yielding 4.5 liters of
peritoneal fluid.

## 2016-09-08 ENCOUNTER — Telehealth: Payer: Self-pay

## 2016-09-08 LAB — CULTURE, BODY FLUID-BOTTLE

## 2016-09-08 LAB — CULTURE, BODY FLUID W GRAM STAIN -BOTTLE: Culture: NO GROWTH

## 2016-09-08 MED ORDER — LACTULOSE 10 GM/15ML PO SOLN: ORAL | 5 refills | Status: DC

## 2016-09-08 NOTE — Telephone Encounter (Signed)
Pt called and said she has been having to take Lactulose 30 ml's to have BM's.  She takes it after she eats, but sometimes only eats twice a day. But said she wanted [place] to know, because she will need it again before 09/28/2016. Sending to Roseanne Kaufman, NP who saw the pt last in the office. Vicente Males, please note the phone note of 08/18/2016 in reference to Lactulose also).  Please advise!

## 2016-09-08 NOTE — Telephone Encounter (Signed)
Done

## 2016-09-08 NOTE — Telephone Encounter (Signed)
PT is aware.

## 2016-09-10 ENCOUNTER — Other Ambulatory Visit: Payer: Self-pay | Admitting: Nurse Practitioner

## 2016-09-13 IMAGING — US US PARACENTESIS
1 series · 4 of 4 positions shown · non-contrast
Comparison: none

INDICATION: Ascites.

[Series 1: us paracentesis · 0.21mm/px · 4 of 4 slices shown]
[im 1/4]
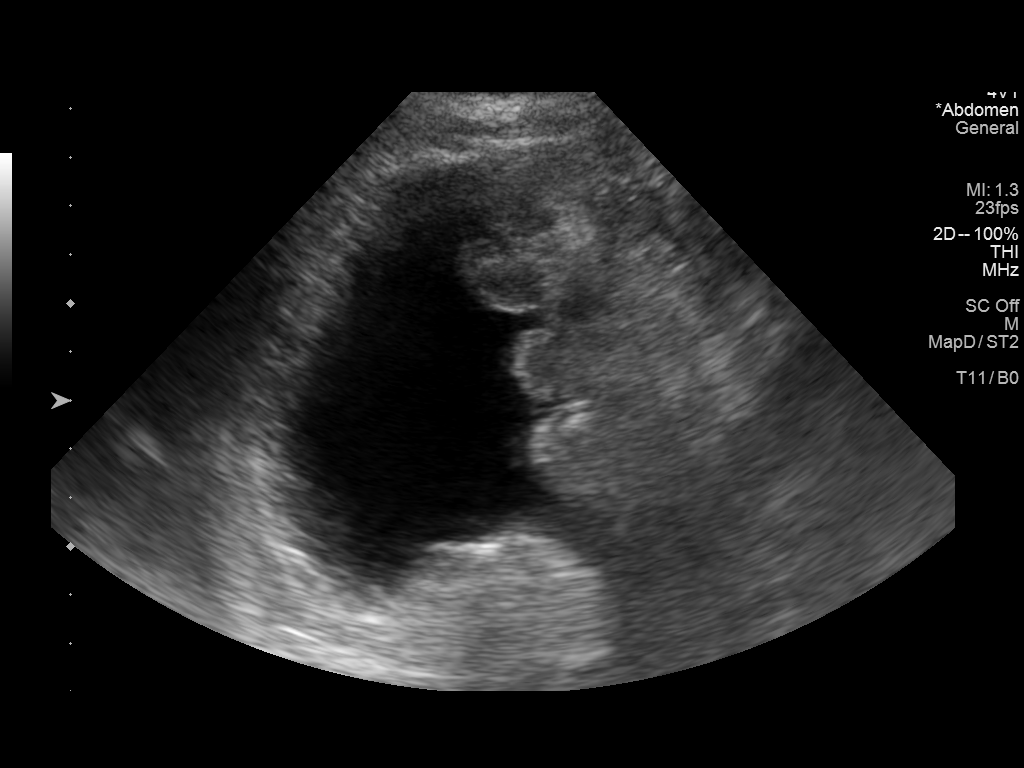
[im 2/4]
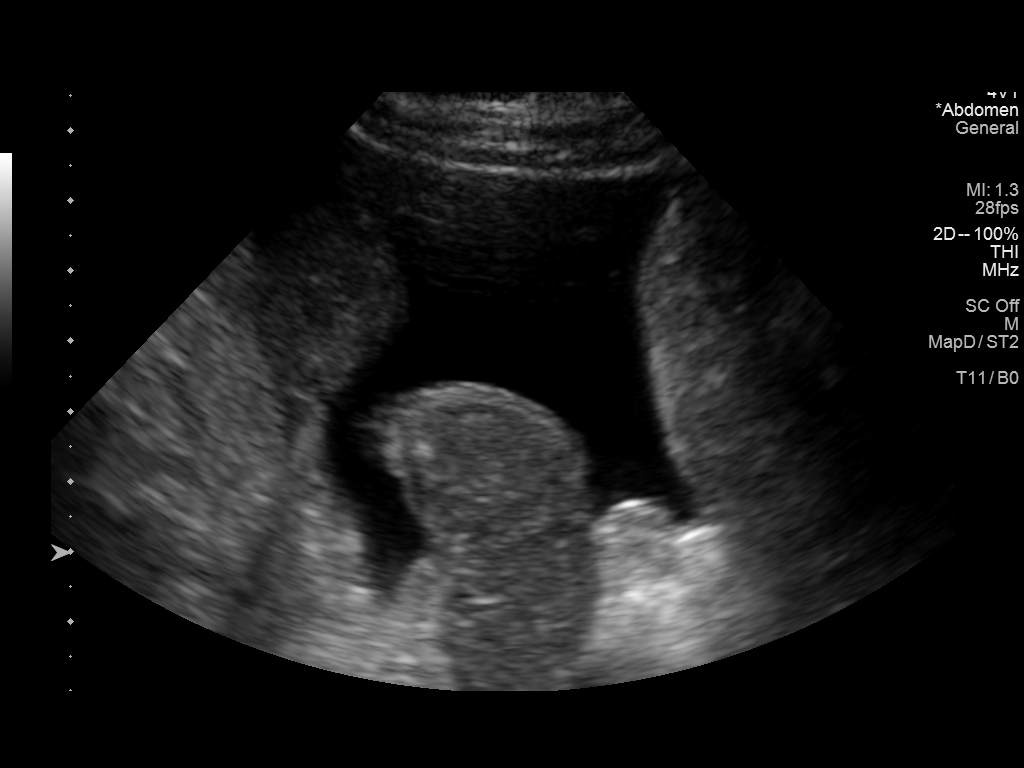
[im 3/4]
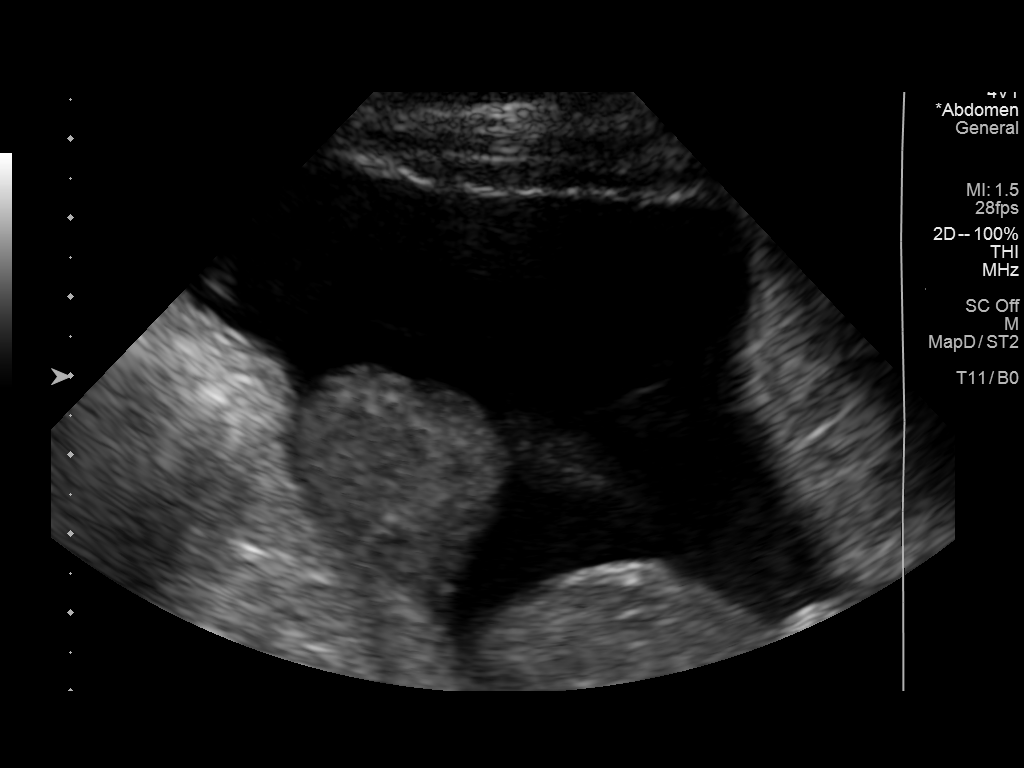
[im 4/4]
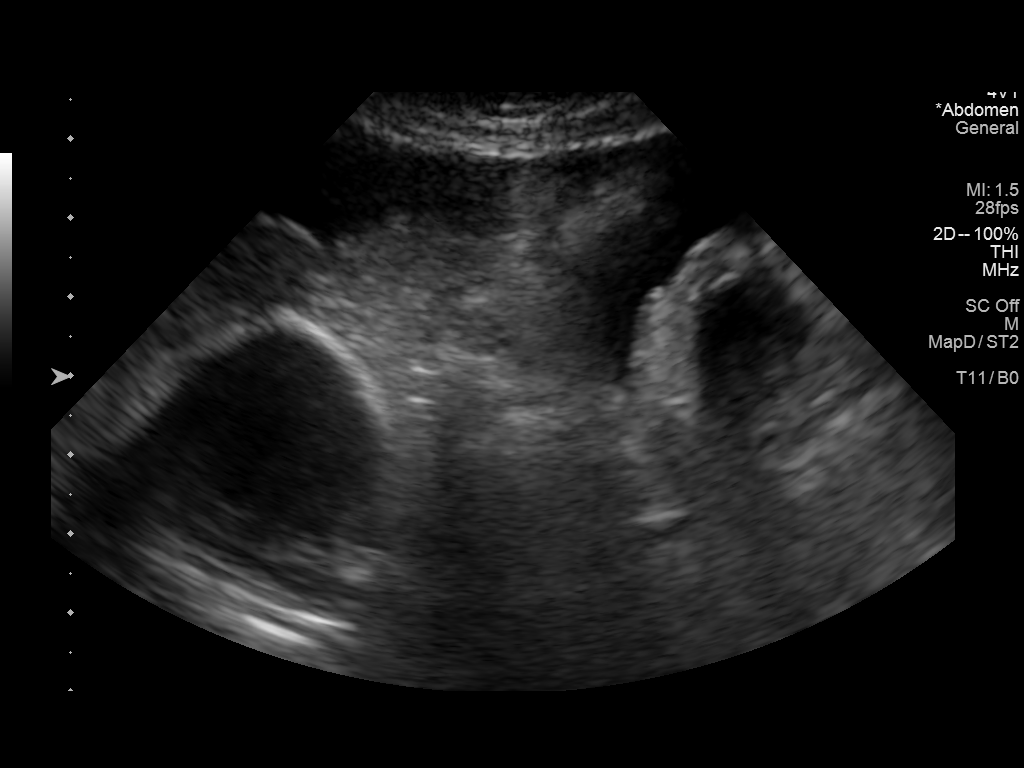

[4 of 4 positions shown; findings below may reference images not displayed]

EXAM:
ULTRASOUND GUIDED PARACENTESIS

MEDICATIONS:
None.

COMPLICATIONS:
None immediate.

PROCEDURE:
Informed written consent was obtained from the patient after a
discussion of the risks, benefits and alternatives to treatment. A
timeout was performed prior to the initiation of the procedure.

Initial ultrasound scanning demonstrates a large amount of ascites
within the right lower abdominal quadrant. The right lower abdomen
was prepped and draped in the usual sterile fashion. 1% lidocaine
was used for local anesthesia.

Following this, a 6 French catheter was introduced. An ultrasound
image was saved for documentation purposes. The paracentesis was
performed. The catheter was removed and a dressing was applied. The
patient tolerated the procedure well without immediate post
procedural complication.
FINDINGS: A total of approximately 1.2 L of clear yellow fluid was removed.
Samples were sent to the laboratory as requested by the clinical
team.
IMPRESSION: Successful ultrasound-guided paracentesis yielding 1.2 liters of
peritoneal fluid.

## 2016-09-19 ENCOUNTER — Encounter (HOSPITAL_COMMUNITY): Payer: Self-pay | Admitting: Emergency Medicine

## 2016-09-19 ENCOUNTER — Emergency Department (HOSPITAL_COMMUNITY): Payer: Medicare Other

## 2016-09-19 ENCOUNTER — Emergency Department (HOSPITAL_COMMUNITY)
Admission: EM | Admit: 2016-09-19 | Discharge: 2016-09-19 | Disposition: A | Discharge status: Home / Self Care | Payer: Medicare Other | Source: Home / Self Care | Attending: Emergency Medicine | Admitting: Emergency Medicine

## 2016-09-19 DIAGNOSIS — R188 Other ascites: Secondary | ICD-10-CM

## 2016-09-19 DIAGNOSIS — Z87891 Personal history of nicotine dependence: Secondary | ICD-10-CM | POA: Insufficient documentation

## 2016-09-19 DIAGNOSIS — M549 Dorsalgia, unspecified: Secondary | ICD-10-CM

## 2016-09-19 DIAGNOSIS — R1084 Generalized abdominal pain: Secondary | ICD-10-CM

## 2016-09-19 DIAGNOSIS — K7031 Alcoholic cirrhosis of liver with ascites: Secondary | ICD-10-CM

## 2016-09-19 DIAGNOSIS — K7290 Hepatic failure, unspecified without coma: Secondary | ICD-10-CM | POA: Diagnosis not present

## 2016-09-19 DIAGNOSIS — Z79899 Other long term (current) drug therapy: Secondary | ICD-10-CM

## 2016-09-19 DIAGNOSIS — N186 End stage renal disease: Secondary | ICD-10-CM | POA: Insufficient documentation

## 2016-09-19 DIAGNOSIS — Z992 Dependence on renal dialysis: Secondary | ICD-10-CM | POA: Insufficient documentation

## 2016-09-19 DIAGNOSIS — R109 Unspecified abdominal pain: Secondary | ICD-10-CM

## 2016-09-19 DIAGNOSIS — A419 Sepsis, unspecified organism: Secondary | ICD-10-CM | POA: Diagnosis not present

## 2016-09-19 LAB — COMPREHENSIVE METABOLIC PANEL
ALBUMIN: 3.8 g/dL (ref 3.5–5.0)
ALK PHOS: 148 U/L — AB (ref 38–126)
ALT: 34 U/L (ref 14–54)
ANION GAP: 22 — AB (ref 5–15)
AST: 74 U/L — ABNORMAL HIGH (ref 15–41)
BUN: 129 mg/dL — ABNORMAL HIGH (ref 6–20)
CO2: 21 mmol/L — AB (ref 22–32)
Calcium: 9.5 mg/dL (ref 8.9–10.3)
Chloride: 93 mmol/L — ABNORMAL LOW (ref 101–111)
Creatinine, Ser: 18.82 mg/dL — ABNORMAL HIGH (ref 0.44–1.00)
GFR calc Af Amer: 2 mL/min — ABNORMAL LOW (ref >60)
GFR calc non Af Amer: 2 mL/min — ABNORMAL LOW (ref >60)
GLUCOSE: 133 mg/dL — AB (ref 65–99)
Potassium: 4.5 mmol/L (ref 3.5–5.1)
SODIUM: 136 mmol/L (ref 135–145)
Total Bilirubin: 1.6 mg/dL — ABNORMAL HIGH (ref 0.3–1.2)
Total Protein: 7.8 g/dL (ref 6.5–8.1)

## 2016-09-19 LAB — CBC WITH DIFFERENTIAL/PLATELET
BASOS PCT: 1 %
Basophils Absolute: 0.1 10*3/uL (ref 0.0–0.1)
EOS ABS: 0.1 10*3/uL (ref 0.0–0.7)
Eosinophils Relative: 1 %
HCT: 31.5 % — ABNORMAL LOW (ref 36.0–46.0)
HEMOGLOBIN: 10.1 g/dL — AB (ref 12.0–15.0)
Lymphocytes Relative: 11 %
Lymphs Abs: 0.6 10*3/uL — ABNORMAL LOW (ref 0.7–4.0)
MCH: 32.2 pg (ref 26.0–34.0)
MCHC: 32.1 g/dL (ref 30.0–36.0)
MCV: 100.3 fL — ABNORMAL HIGH (ref 78.0–100.0)
MONOS PCT: 6 %
Monocytes Absolute: 0.3 10*3/uL (ref 0.1–1.0)
NEUTROS PCT: 80 %
Neutro Abs: 4.2 10*3/uL (ref 1.7–7.7)
Platelets: 75 10*3/uL — ABNORMAL LOW (ref 150–400)
RBC: 3.14 MIL/uL — ABNORMAL LOW (ref 3.87–5.11)
RDW: 18.6 % — AB (ref 11.5–15.5)
WBC: 5.2 10*3/uL (ref 4.0–10.5)

## 2016-09-19 LAB — LIPASE, BLOOD: Lipase: 144 U/L — ABNORMAL HIGH (ref 11–51)

## 2016-09-19 MED ORDER — IOPAMIDOL (ISOVUE-300) INJECTION 61%
INTRAVENOUS | Status: AC
  Filled 2016-09-19: qty 30

## 2016-09-19 MED ORDER — IOPAMIDOL (ISOVUE-300) INJECTION 61%: 75.0000 mL | Freq: Once | INTRAVENOUS | Status: DC | PRN

## 2016-09-19 MED ORDER — SODIUM CHLORIDE 0.9 % IV SOLN: INTRAVENOUS | Status: DC

## 2016-09-19 MED ORDER — HYDROMORPHONE HCL 1 MG/ML IJ SOLN
1.0000 mg | Freq: Once | INTRAMUSCULAR | Status: DC
  Filled 2016-09-19: qty 1

## 2016-09-19 MED ORDER — FENTANYL CITRATE (PF) 100 MCG/2ML IJ SOLN
25.0000 ug | Freq: Once | INTRAMUSCULAR | Status: AC
  Administered 2016-09-19: 25 ug via INTRAVENOUS
  Filled 2016-09-19: qty 2

## 2016-09-19 MED ORDER — OXYCODONE-ACETAMINOPHEN 5-325 MG PO TABS: 1.0000 | ORAL_TABLET | Freq: Four times a day (QID) | ORAL | 0 refills | Status: DC | PRN

## 2016-09-19 MED ORDER — OXYCODONE-ACETAMINOPHEN 5-325 MG PO TABS
1.0000 | ORAL_TABLET | Freq: Once | ORAL | Status: AC
  Administered 2016-09-19: 1 via ORAL
  Filled 2016-09-19: qty 1

## 2016-09-19 MED ORDER — ONDANSETRON HCL 4 MG/2ML IJ SOLN
4.0000 mg | Freq: Once | INTRAMUSCULAR | Status: AC
  Administered 2016-09-19: 4 mg via INTRAVENOUS
  Filled 2016-09-19: qty 2

## 2016-09-19 NOTE — Discharge Instructions (Signed)
Follow-up with dialysis on Monday. Also follow-up to have his ascites drained. Follow-up with GI medicine. Return for any new or worse symptoms.

## 2016-09-19 NOTE — ED Triage Notes (Signed)
Patient c/o lower abd pain and low back pain. Per patient abd pain x1 week and back pain starting this morning. Patient reports nausea and vomiting. Denies any diarrhea, fevers, or urinary symptoms. Patient is a dialysis patient and has not been dialyzed since last Friday.

## 2016-09-20 IMAGING — US US PARACENTESIS
1 series · 3 of 3 positions shown · non-contrast
Comparison: none

INDICATION: Cirrhosis, ascites

[Series 1: us paracentesis · 0.19mm/px · 3 of 3 slices shown]
[im 1/3]
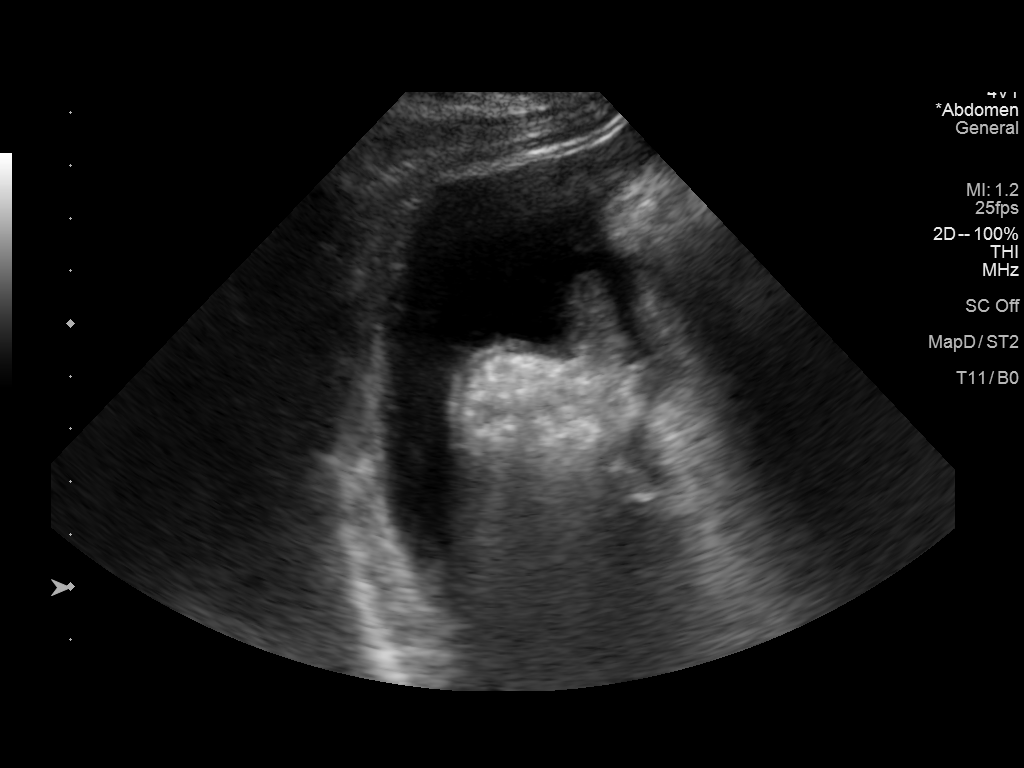
[im 2/3]
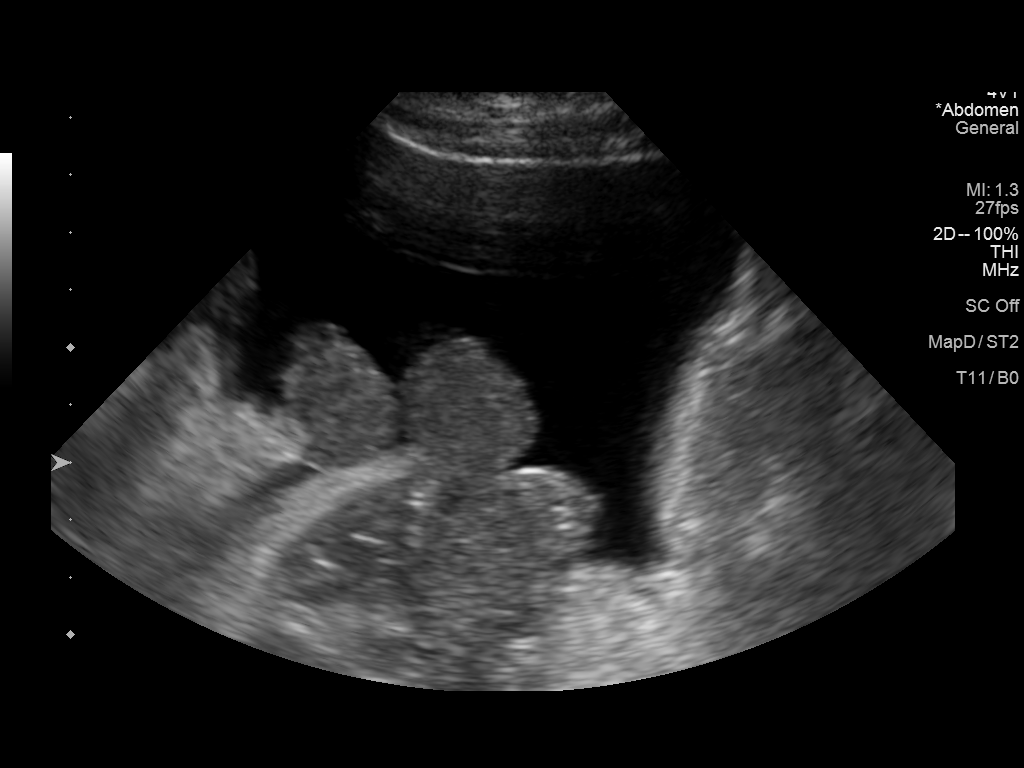
[im 3/3]
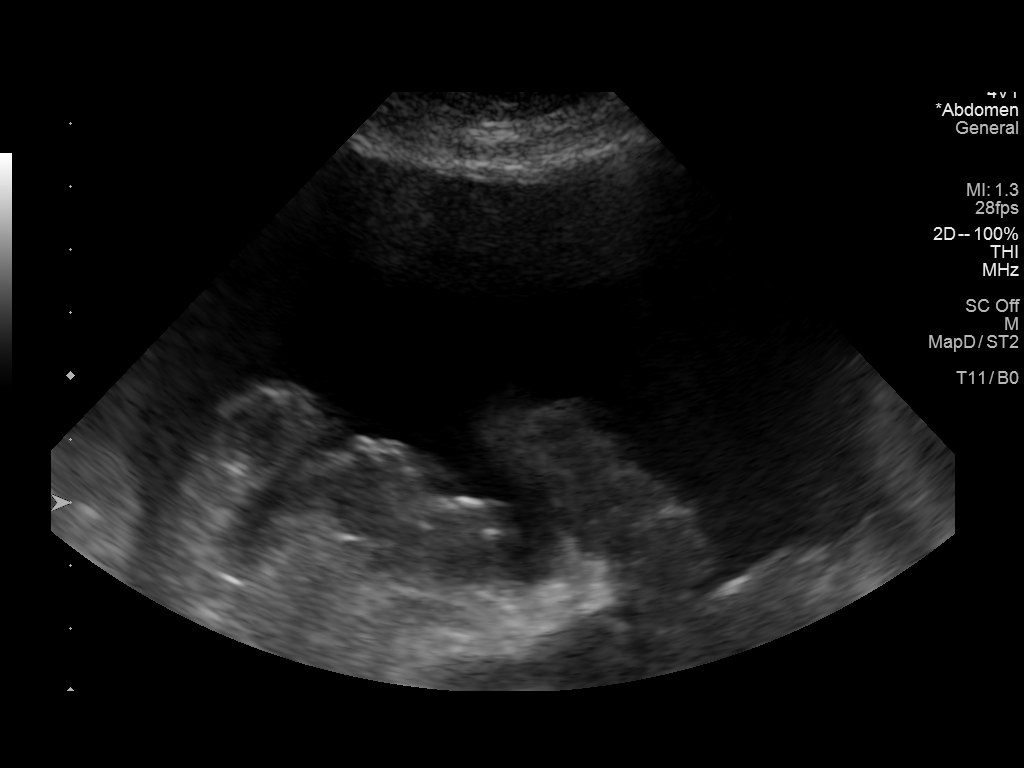

[3 of 3 positions shown; findings below may reference images not displayed]

EXAM:
ULTRASOUND GUIDED THERAPEUTIC PARACENTESIS

MEDICATIONS:
None.

COMPLICATIONS:
None immediate.

PROCEDURE:
Procedure, benefits, and risks of procedure were discussed with
patient.

Written informed consent for procedure was obtained.

Time out protocol followed.

Adequate collection of ascites localized by ultrasound in LEFT lower
quadrant.

Skin prepped and draped in usual sterile fashion.

Skin and soft tissues anesthetized with 10 mL of 1% lidocaine.

5 French Yueh catheter placed into peritoneal cavity.

4.2 L of yellow ascitic fluid aspirated by vacuum bottle suction.

Procedure tolerated well by patient without immediate complication.
FINDINGS: As above
IMPRESSION: Successful ultrasound-guided paracentesis yielding 4.2 liters of
peritoneal fluid.

## 2016-09-20 NOTE — ED Provider Notes (Signed)
[facility] DEPT Provider Note   CSN: [id number] Arrival date & time: 09/19/16  1222     History   Chief Complaint Chief Complaint  Patient presents with  . Abdominal Pain    HPI Wanda [name] is a 41 y.o. female.  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 On dialysis for the past 2 years. Patient has not gone to dialysis in 7 days. Patient with complaint of abdominal discomfort generalized abdominal distention. Patient known to have cirrhosis and ascites. Patient followed by GI medicine Dr. Oneida Alar. Patient states she wasn't feeling well enough to go to dialysis. Patient requesting pain medication. Patient denies any fevers. Denies any bleeding. Patient normally gets her dialysis in [place].      Past Medical History:  Diagnosis Date  . Acute blood loss anemia 02/25/2014   Status post transfusion  . Acute renal failure ([place]) 09/2013   Pre-renal- resolved  . Anasarca 10/10/2013  . Anxiety   . Bipolar disorder ([place]) 12/04/2013   2007-SEEN IN ED FOR INVOLUNTARY COMMITMENT, UDS POS FOR AMPHETAMINES/OPIATES   . Bleeding esophageal varices ([place]) 02/28/2014   s/p banding  . C. difficile colitis 04/19/2014  . Chronic hypotension   . Cirrhosis ([place]) 10/05/13   Liver bx 11/23/13 (delayed initially due to patient refusal). c/w steatohepatitis  . Cirrhosis of liver with ascites ([place])   . Depression   . ESRD (end stage renal disease) on dialysis ([place]) 08/2014  . Folate deficiency 09/2013  . Gastroesophageal junction ulcer 09/17/2014  . GERD (gastroesophageal reflux disease)   . Hematemesis/vomiting blood 02/24/2014  . Macrocytosis 02/28/2014  . PNA (pneumonia) 10/13/2013  . SBP (spontaneous bacterial peritonitis) ([place]) 11/10/2013  . Thrombocytopenia ([place])    Hypercellular bone marrow; abundant megakaryocytes per 08/27/2014; s/p bone marrow bx    Patient Active Problem List   Diagnosis Date Noted  . Pressure injury of skin 05/20/2016  . Shock ([place]) 05/19/2016  .  Mallory-Weiss tear   . Rhonchi   . Alcoholic cirrhosis of liver with ascites ([place])   . Chronic pain syndrome 08/22/2015  . ESRD (end stage renal disease) ([place]) 08/20/2015  . Abdominal wall cellulitis 08/20/2015  . Chronic abdominal pain 08/07/2015  . Umbilical hernia without obstruction and without gangrene 08/07/2015  . Liver cirrhosis secondary to NASH ([place])   . Acute blood loss anemia   . Hypotension 06/05/2015  . Edema of left lower extremity 02/22/2015  . UGIB (upper gastrointestinal bleed) 09/16/2014  . ESRD (end stage renal disease) on dialysis ([place])   . Melena   . Macrocytosis 09/12/2014  . Thrombocytopenia ([place]) 08/19/2014  . Esophageal varices without bleeding ([place])   . Portal hypertensive gastropathy   . Malnutrition of moderate degree ([place]) 05/15/2014  . Hepatic encephalopathy ([place]) 05/14/2014  . C. difficile colitis 04/19/2014  . Bipolar disorder ([place]) 12/04/2013  . SBP (spontaneous bacterial peritonitis) ([place]) 11/10/2013  . Liver cirrhosis secondary to nonalcoholic steatohepatitis (NASH) ([place]) 11/07/2013  . PNA (pneumonia) 10/13/2013  . Folate deficiency 10/13/2013  . Ascites 09/11/2013    Past Surgical History:  Procedure Laterality Date  . AV FISTULA PLACEMENT Right 11/16/2014   Procedure: Right arm Creation of arteriovenous fistula;  Surgeon: Angelia Mould, MD;  Location: [facility];  Service: Vascular;  Laterality: Right;  . CENTRAL VENOUS CATHETER INSERTION Right   . COLONOSCOPY N/A 12/19/2013   SLF:NO OBVIOUS SOURCE FOR ANEMIA IDETIFIED/ONE COLON POLYP REMOVED/Small internal hemorrhoids  . ESOPHAGEAL BANDING  07/04/2014   Procedure: ESOPHAGEAL BANDING;  Surgeon: Daneil Dolin, MD;  [facility];  Service: Endoscopy;;  . ESOPHAGEAL BANDING N/A 07/24/2014   Procedure: ESOPHAGEAL BANDING (2 bands applied);  Surgeon: Danie Binder, MD;  Location: [facility];  Service: Endoscopy;  Laterality: N/A;  . ESOPHAGEAL BANDING N/A 07/23/2015   Procedure:  ESOPHAGEAL BANDING;  Surgeon: Danie Binder, MD;  [facility];  Service: Endoscopy;  Laterality: N/A;  . ESOPHAGEAL BANDING N/A 03/04/2016   Procedure: ESOPHAGEAL BANDING;  Surgeon: Danie Binder, MD;  [facility];  Service: Endoscopy;  Laterality: N/A;  . ESOPHAGEAL BANDING N/A 04/30/2016   Procedure: ESOPHAGEAL BANDING;  Surgeon: Daneil Dolin, MD;  [facility];  Service: Endoscopy;  Laterality: N/A;  . ESOPHAGOGASTRODUODENOSCOPY N/A 11/14/2013   SLF:1 column of very small varices in distal esopahgus/MODERATE PORTAL GASTROPATHY IN PROXIMAL STOMACH/MODERATE erosive gastritis  . ESOPHAGOGASTRODUODENOSCOPY N/A 02/11/2014   Dr. Rourk:Esophageal varices with bleeding stigmata-status post esophageal band ligation therapy. Portal gastropathy  . ESOPHAGOGASTRODUODENOSCOPY N/A 07/04/2014   RMR: Persiting grade 2 esophageal varicies with bleeding stigmata status post band ligation. Significantly congested gastric mucosa iwith changes constistant with protal gastropathy.   . ESOPHAGOGASTRODUODENOSCOPY N/A 09/16/2014   Rehman: Single short column of varix proximal to GE junction not large enough to be banded. Two amall ulcers at the GEJ felt to be source of GI Bleeding but no active bleeding but no actibe bleeding noted. No therapy rendered. Portal gastropathy NO evidence of peptic ulcer diease or gastric varices.   . ESOPHAGOGASTRODUODENOSCOPY N/A 12/14/2014   SLF: Grade ! esophageal varices. 2. Moderate Portal Gastropathy  . ESOPHAGOGASTRODUODENOSCOPY N/A 03/27/2015   Procedure: ESOPHAGOGASTRODUODENOSCOPY (EGD);  Surgeon: Danie Binder, MD;  [facility];  Service: Endoscopy;  Laterality: N/A;  1045am - moved to 817 @ 11:30  . ESOPHAGOGASTRODUODENOSCOPY N/A 06/05/2015   Procedure: ESOPHAGOGASTRODUODENOSCOPY (EGD);  Surgeon: Clarene Essex, MD;  Location: [facility] ENDOSCOPY;  Service: Endoscopy;  Laterality: N/A;  . ESOPHAGOGASTRODUODENOSCOPY N/A 07/23/2015   SLF:1. Grade 1  esophageal varices 2. Moderate portal hypertensive gastropathy 3. MILd non-erosive gastritis.   [name] ESOPHAGOGASTRODUODENOSCOPY N/A 03/04/2016   Dr. Oneida Alar: grade 1 varices, surveillance in Jan 2017   . ESOPHAGOGASTRODUODENOSCOPY N/A 05/21/2016   Procedure: ESOPHAGOGASTRODUODENOSCOPY (EGD);  Surgeon: Gatha Mayer, MD;  Location: [facility] ENDOSCOPY;  Service: Endoscopy;  Laterality: N/A;  . ESOPHAGOGASTRODUODENOSCOPY (EGD) WITH PROPOFOL N/A 07/24/2014   SLF:  1. 2 columns grade 2-3 varices- 2 Bands applied.  2.  Moderate gastropathy 3. Duodenal Diverticula  . ESOPHAGOGASTRODUODENOSCOPY (EGD) WITH PROPOFOL N/A 10/26/2014   RMR: 2 columns of grade 2 esophageal varices without obvious bleeding stigmata status post band ligation to complet obliteration of remaining varices.   . ESOPHAGOGASTRODUODENOSCOPY (EGD) WITH PROPOFOL N/A 04/30/2016   Procedure: ESOPHAGOGASTRODUODENOSCOPY (EGD) WITH PROPOFOL;  Surgeon: Daneil Dolin, MD;  [facility];  Service: Endoscopy;  Laterality: N/A;  . None    . PARACENTESIS  Feb 2015   1180 fluid, negative fluid analysis.   [name] PARACENTESIS  10/2013    OB History    Gravida Para Term Preterm AB Living   1 1 1     1    SAB TAB Ectopic Multiple Live Births                   Home Medications    Prior to Admission medications   Medication Sig Start Date End Date Taking? Authorizing Provider  clotrimazole (LOTRIMIN) 1 % cream APPLY TOPICALLY  THREE TIMES DAILY FOR 10 DAYS 09/11/16  Yes Annitta Needs, NP  Darbepoetin Alfa (ARANESP) 60 MCG/0.3ML SOSY injection Inject 0.3 mLs (60 mcg total) into the vein every Thursday with hemodialysis. Patient taking differently: Inject 60 mcg into the vein every Tuesday with hemodialysis.  06/09/15  Yes Cherene Altes, MD  lactulose (CHRONULAC) 10 GM/15ML solution TAKE 15-30 ml BY MOUTH three times a day 09/08/16  Yes Annitta Needs, NP  lidocaine (XYLOCAINE) 2 % solution TAKE TWO TEASPOONSFUL (10ML) BY MOUTH BEFORE MEALS AND AT  BEDTIME TO PREVENT CHEST PAIN WHILE EATING 03/19/16  Yes Carlis Stable, NP  lidocaine-prilocaine (EMLA) cream Apply 1 application topically as needed (for dialysis treatments).   Yes Historical Provider, MD  LORazepam (ATIVAN) 1 MG tablet TAKE 1/2 TO 1 TABLET BY MOUTH TWICE DAILY AS NEEDED FOR BACK SPASM OR FOR ANXIETY Patient taking differently: Take 0.5-1 mg by mouth 2 (two) times daily as needed for anxiety (or back spasms).  01/29/16  Yes Danie Binder, MD  midodrine (PROAMATINE) 10 MG tablet Take 1 tablet (10 mg total) by mouth daily. *Takes only on dialysis days on Tuesdays, Thursdays, and Saturdays (Sometimes takes treatments on Friday instead of Saturday)**Medication to keep your blood pressure from falling. Patient taking differently: Take 10 mg by mouth See admin instructions. Take 10 mg by mouth on dialysis days on Tuesdays, Thursdays, and Saturdays (Sometimes takes treatments on Friday instead of Saturday) 06/09/15  Yes Cherene Altes, MD  Oxycodone HCl 10 MG TABS 1 PO TID AS NEEDED FOR PAIN 08/19/16  Yes Danie Binder, MD  RENVELA 800 MG tablet Take 1600 mg by mouth 3 times daily with meals. Take 800 mg by mouth with snacks. 10/29/15  Yes Historical Provider, MD  sucralfate (CARAFATE) 1 GM/10ML suspension Take 10 mLs (1 g total) by mouth 4 (four) times daily -  with meals and at bedtime. 05/27/16  Yes Belkys A Regalado, MD  oxyCODONE-acetaminophen (PERCOCET/ROXICET) 5-325 MG tablet Take 1-2 tablets by mouth every 6 (six) hours as needed for severe pain. 09/19/16   Fredia Sorrow, MD    Family History Family History  Problem Relation Age of Onset  . Heart disease Mother   . Colon cancer Neg Hx   . Liver disease Neg Hx     Social History Social History  Substance Use Topics  . Smoking status: Former Smoker    Packs/day: 0.25    Years: 20.00    Types: Cigarettes    Quit date: 11/05/2008  . Smokeless tobacco: Never Used     Comment: Never really smoked much  . Alcohol use No      Allergies   Lasix [furosemide] and Latex   Review of Systems Review of Systems  Constitutional: Negative for fever.  HENT: Negative for congestion.   Eyes: Negative for visual disturbance.  Respiratory: Negative for shortness of breath.   Cardiovascular: Positive for leg swelling. Negative for chest pain.  Gastrointestinal: Positive for abdominal distention and abdominal pain.  Musculoskeletal: Negative for back pain.  Skin: Negative for rash.  Neurological: Negative for headaches.  Hematological: Bruises/bleeds easily.  Psychiatric/Behavioral: Negative for confusion.     Physical Exam Updated Vital Signs BP 102/71 (BP Location: Left Wrist)   Pulse 87   Temp 97.9 F (36.6 C) (Oral)   Resp 20   Ht 5\' 1"  (1.549 m)   Wt 72.6 kg   LMP 09/21/2013   SpO2 97%   BMI 30.23 kg/m  Physical Exam  Constitutional: She is oriented to person, place, and time. She appears well-developed and well-nourished. No distress.  HENT:  Head: Normocephalic and atraumatic.  Mouth/Throat: Oropharynx is clear and moist.  Eyes: EOM are normal. Pupils are equal, round, and reactive to light.  Neck: Normal range of motion. Neck supple.  Cardiovascular: Normal rate and regular rhythm.   Pulmonary/Chest: Effort normal and breath sounds normal.  Abdominal: Soft. Bowel sounds are normal. She exhibits distension. There is no tenderness.  Distention consistent with ascites. Umbilicus distended. Nontender no concern for hernia.  Musculoskeletal: Normal range of motion. She exhibits edema.  AV fistula with good thrill right upper arm  Neurological: She is alert and oriented to person, place, and time. No cranial nerve deficit or sensory deficit. She exhibits normal muscle tone. Coordination normal.  Skin: Skin is warm.  Nursing note and vitals reviewed.    ED Treatments / Results  Labs (all labs ordered are listed, but only abnormal results are displayed) Labs Reviewed  CBC WITH  DIFFERENTIAL/PLATELET - Abnormal; Notable for the following:       Result Value   RBC 3.14 (*)    Hemoglobin 10.1 (*)    HCT 31.5 (*)    MCV 100.3 (*)    RDW 18.6 (*)    Platelets 75 (*)    Lymphs Abs 0.6 (*)    All other components within normal limits  COMPREHENSIVE METABOLIC PANEL - Abnormal; Notable for the following:    Chloride 93 (*)    CO2 21 (*)    Glucose, Bld 133 (*)    BUN 129 (*)    Creatinine, Ser 18.82 (*)    AST 74 (*)    Alkaline Phosphatase 148 (*)    Total Bilirubin 1.6 (*)    GFR calc non Af Amer 2 (*)    GFR calc Af Amer 2 (*)    Anion gap 22 (*)    All other components within normal limits  LIPASE, BLOOD - Abnormal; Notable for the following:    Lipase 144 (*)    All other components within normal limits    EKG  EKG Interpretation None       Radiology Ct Abdomen Pelvis Wo Contrast  Result Date: 09/19/2016 CLINICAL DATA:  Acute onset of lower abdominal and lower back pain. Patient has been on dialysis for 2 years. Initial encounter. EXAM: CT ABDOMEN AND PELVIS WITHOUT CONTRAST TECHNIQUE: Multidetector CT imaging of the abdomen and pelvis was performed following the standard protocol without IV contrast. COMPARISON:  CT of the abdomen and pelvis from 01/02/2016 FINDINGS: Lower chest: Mild bibasilar atelectasis is noted. Diffuse coronary artery calcifications are seen. Hepatobiliary: There is a nodular contour to the liver, compatible with the patient's known hepatic cirrhosis. Evaluation for hepatic masses is limited without contrast. Stones are seen dependently within the gallbladder. The gallbladder is otherwise unremarkable, though difficult to fully assess due to surrounding ascites. The common bile duct is mildly dilated, measuring 9 mm, without definite evidence of distal obstruction on CT. Pancreas: The pancreas is unremarkable in appearance. Spleen: The spleen is grossly unremarkable. Mildly prominent splenic varices are noted. Adrenals/Urinary Tract:  The adrenal glands are grossly unremarkable in appearance. Chronic bilateral renal atrophy is noted. Large varices are seen tracking to the right side of the right kidney. There is no evidence of hydronephrosis. No renal or ureteral stones are identified. Minimal right-sided perinephric stranding is seen. Stomach/Bowel: The stomach is unremarkable in appearance. The small bowel  is within normal limits. The appendix is not visualized; there is no evidence for appendicitis. The colon is unremarkable in appearance. Mildly prominent gastric varices are noted. Vascular/Lymphatic: Scattered calcification is seen along the abdominal aorta and its branches. No definite retroperitoneal or pelvic sidewall lymphadenopathy is seen. There is mild recanalization of the umbilical vein. Reproductive: The bladder is mildly distended and grossly unremarkable. The uterus is unremarkable. The ovaries are relatively symmetric. No suspicious adnexal masses are seen. Other: Moderate to large volume ascites is seen within the abdomen and pelvis. Note is made of a small to moderate umbilical hernia, containing fluid and fat. Musculoskeletal: No acute osseous abnormalities are identified. The visualized musculature is unremarkable in appearance. IMPRESSION: 1. No acute abnormality seen to explain the patient's symptoms. 2. Moderate to large volume ascites within the abdomen and pelvis. 3. Cholelithiasis. Gallbladder otherwise unremarkable. Mild dilatation of the common bile duct to 9 mm. This appears grossly stable from 2017 and may reflect the patient's baseline. Would correlate for any associated symptoms. 4. Findings of hepatic cirrhosis. Recanalization of the umbilical vein, and mildly prominent gastric and splenic varices. 5. Chronic bilateral renal atrophy. Large varices to the right side of the right kidney. 6. Diffuse coronary artery calcifications. 7. Scattered aortic atherosclerosis. 8. Small to moderate umbilical hernia,  containing fluid and fat. 9. Mild bibasilar atelectasis noted. Electronically Signed   By: Garald Balding M.D.   On: 09/19/2016 19:36   Dg Chest 2 View  Result Date: 09/19/2016 CLINICAL DATA:  Abdominal pain, back pain EXAM: CHEST  2 VIEW COMPARISON:  05/20/2016 FINDINGS: The heart size and mediastinal contours are within normal limits. Both lungs are clear. The visualized skeletal structures are unremarkable. IMPRESSION: No active cardiopulmonary disease. Electronically Signed   By: Kathreen Devoid   On: 09/19/2016 16:38    Procedures Procedures (including critical care time)  Medications Ordered in ED Medications  ondansetron (ZOFRAN) injection 4 mg (4 mg Intravenous Given 09/19/16 1715)  fentaNYL (SUBLIMAZE) injection 25 mcg (25 mcg Intravenous Given 09/19/16 1745)  oxyCODONE-acetaminophen (PERCOCET/ROXICET) 5-325 MG per tablet 1 tablet (1 tablet Oral Given 09/19/16 2039)     Initial Impression / Assessment and Plan / ED Course  I have reviewed the triage vital signs and the nursing notes.  Pertinent labs & imaging results that were available during my care of the patient were reviewed by me and considered in my medical decision making (see chart for details).     Patient with known history of cirrhosis. Patient with known renal failure. Patient also with a history of known ascites. Patient states she has not been dialyzed in 7 days. Was not feeling well. Came in today with complaint of abdominal distention and some generalized abdominal discomfort. Abdomen nontender to palpation not consistent with bacterial peritonitis. Patient's labs do not require dialysis at this point in time chest ray negative for any fluid overload. Patient treated with some pain medicine with improvement.  Patient is followed by GI medicine the patient also has routine removal of the peritoneal fluid. She'll recalls for appointment. Patient instructed very important that she go to dialysis on Monday. Normal she  dialyzed Tuesdays Thursdays and Saturdays but has difficulty getting there on Saturday. Patient labs without significant abnormalities. Patient has chronic elevated lipase. LFTs are baseline for her. CT scan of the abdomen was negative except for the ascites. Patient also has cholelithiasis but there is no evidence of any acute cholecystitis.  Final Clinical Impressions(s) / ED Diagnoses  Final diagnoses:  End stage renal failure on dialysis [facility])  Ascites of liver  Generalized abdominal pain    New Prescriptions Discharge Medication List as of 09/19/2016  9:03 PM    START taking these medications   Details  oxyCODONE-acetaminophen (PERCOCET/ROXICET) 5-325 MG tablet Take 1-2 tablets by mouth every 6 (six) hours as needed for severe pain., Starting Sat 09/19/2016, Print         Fredia Sorrow, MD 09/20/16 0040

## 2016-09-21 ENCOUNTER — Other Ambulatory Visit: Payer: Self-pay

## 2016-09-21 ENCOUNTER — Telehealth: Payer: Self-pay | Admitting: Gastroenterology

## 2016-09-21 DIAGNOSIS — R188 Other ascites: Secondary | ICD-10-CM

## 2016-09-21 NOTE — Telephone Encounter (Signed)
LMOM for [employer]. I have her set up for PARA on 09/23/16 @ 9:00 am. They did not have anything elas

## 2016-09-21 NOTE — Telephone Encounter (Signed)
[name] called to see if we could schedule patient a para for Wedneseday around 1 or 2 pm

## 2016-09-22 ENCOUNTER — Emergency Department (HOSPITAL_COMMUNITY): Payer: Medicare Other

## 2016-09-22 ENCOUNTER — Inpatient Hospital Stay (HOSPITAL_COMMUNITY)
Admission: EM | Admit: 2016-09-22 | Discharge: 2016-09-27 | DRG: 871 | Disposition: A | Discharge status: Home / Self Care | Payer: Medicare Other | Attending: Internal Medicine | Admitting: Internal Medicine

## 2016-09-22 ENCOUNTER — Encounter (HOSPITAL_COMMUNITY): Payer: Self-pay | Admitting: *Deleted

## 2016-09-22 DIAGNOSIS — I9589 Other hypotension: Secondary | ICD-10-CM | POA: Diagnosis present

## 2016-09-22 DIAGNOSIS — G9340 Encephalopathy, unspecified: Secondary | ICD-10-CM | POA: Diagnosis not present

## 2016-09-22 DIAGNOSIS — Z79891 Long term (current) use of opiate analgesic: Secondary | ICD-10-CM

## 2016-09-22 DIAGNOSIS — K7581 Nonalcoholic steatohepatitis (NASH): Secondary | ICD-10-CM | POA: Diagnosis not present

## 2016-09-22 DIAGNOSIS — A419 Sepsis, unspecified organism: Secondary | ICD-10-CM | POA: Diagnosis present

## 2016-09-22 DIAGNOSIS — K219 Gastro-esophageal reflux disease without esophagitis: Secondary | ICD-10-CM | POA: Diagnosis present

## 2016-09-22 DIAGNOSIS — J9601 Acute respiratory failure with hypoxia: Secondary | ICD-10-CM | POA: Diagnosis not present

## 2016-09-22 DIAGNOSIS — R258 Other abnormal involuntary movements: Secondary | ICD-10-CM | POA: Diagnosis present

## 2016-09-22 DIAGNOSIS — R739 Hyperglycemia, unspecified: Secondary | ICD-10-CM | POA: Diagnosis not present

## 2016-09-22 DIAGNOSIS — J189 Pneumonia, unspecified organism: Secondary | ICD-10-CM | POA: Diagnosis present

## 2016-09-22 DIAGNOSIS — I120 Hypertensive chronic kidney disease with stage 5 chronic kidney disease or end stage renal disease: Secondary | ICD-10-CM | POA: Diagnosis not present

## 2016-09-22 DIAGNOSIS — D6489 Other specified anemias: Secondary | ICD-10-CM | POA: Diagnosis present

## 2016-09-22 DIAGNOSIS — R4182 Altered mental status, unspecified: Secondary | ICD-10-CM | POA: Diagnosis present

## 2016-09-22 DIAGNOSIS — K7031 Alcoholic cirrhosis of liver with ascites: Secondary | ICD-10-CM | POA: Diagnosis not present

## 2016-09-22 DIAGNOSIS — M898X9 Other specified disorders of bone, unspecified site: Secondary | ICD-10-CM | POA: Diagnosis not present

## 2016-09-22 DIAGNOSIS — K652 Spontaneous bacterial peritonitis: Secondary | ICD-10-CM | POA: Diagnosis present

## 2016-09-22 DIAGNOSIS — D631 Anemia in chronic kidney disease: Secondary | ICD-10-CM | POA: Diagnosis present

## 2016-09-22 DIAGNOSIS — Z79899 Other long term (current) drug therapy: Secondary | ICD-10-CM

## 2016-09-22 DIAGNOSIS — R680 Hypothermia, not associated with low environmental temperature: Secondary | ICD-10-CM | POA: Diagnosis not present

## 2016-09-22 DIAGNOSIS — Z9109 Other allergy status, other than to drugs and biological substances: Secondary | ICD-10-CM

## 2016-09-22 DIAGNOSIS — R188 Other ascites: Secondary | ICD-10-CM | POA: Diagnosis not present

## 2016-09-22 DIAGNOSIS — K7682 Hepatic encephalopathy: Secondary | ICD-10-CM

## 2016-09-22 DIAGNOSIS — K7460 Unspecified cirrhosis of liver: Secondary | ICD-10-CM | POA: Diagnosis present

## 2016-09-22 DIAGNOSIS — E8889 Other specified metabolic disorders: Secondary | ICD-10-CM | POA: Diagnosis present

## 2016-09-22 DIAGNOSIS — Z91040 Latex allergy status: Secondary | ICD-10-CM

## 2016-09-22 DIAGNOSIS — N390 Urinary tract infection, site not specified: Secondary | ICD-10-CM | POA: Diagnosis present

## 2016-09-22 DIAGNOSIS — N186 End stage renal disease: Secondary | ICD-10-CM | POA: Diagnosis present

## 2016-09-22 DIAGNOSIS — D696 Thrombocytopenia, unspecified: Secondary | ICD-10-CM | POA: Diagnosis present

## 2016-09-22 DIAGNOSIS — Z992 Dependence on renal dialysis: Secondary | ICD-10-CM

## 2016-09-22 DIAGNOSIS — K429 Umbilical hernia without obstruction or gangrene: Secondary | ICD-10-CM | POA: Diagnosis present

## 2016-09-22 DIAGNOSIS — G8929 Other chronic pain: Secondary | ICD-10-CM | POA: Diagnosis present

## 2016-09-22 DIAGNOSIS — R410 Disorientation, unspecified: Secondary | ICD-10-CM | POA: Diagnosis not present

## 2016-09-22 DIAGNOSIS — F319 Bipolar disorder, unspecified: Secondary | ICD-10-CM | POA: Diagnosis present

## 2016-09-22 DIAGNOSIS — K7290 Hepatic failure, unspecified without coma: Secondary | ICD-10-CM | POA: Diagnosis present

## 2016-09-22 DIAGNOSIS — G92 Toxic encephalopathy: Secondary | ICD-10-CM | POA: Diagnosis not present

## 2016-09-22 DIAGNOSIS — Z87891 Personal history of nicotine dependence: Secondary | ICD-10-CM

## 2016-09-22 DIAGNOSIS — F419 Anxiety disorder, unspecified: Secondary | ICD-10-CM | POA: Diagnosis present

## 2016-09-22 DIAGNOSIS — R000 Tachycardia, unspecified: Secondary | ICD-10-CM | POA: Diagnosis present

## 2016-09-22 LAB — CBC WITH DIFFERENTIAL/PLATELET
BASOS PCT: 0 %
Basophils Absolute: 0 10*3/uL (ref 0.0–0.1)
EOS ABS: 0.1 10*3/uL (ref 0.0–0.7)
EOS PCT: 1 %
HCT: 34.1 % — ABNORMAL LOW (ref 36.0–46.0)
Hemoglobin: 11.1 g/dL — ABNORMAL LOW (ref 12.0–15.0)
LYMPHS ABS: 0.5 10*3/uL — AB (ref 0.7–4.0)
Lymphocytes Relative: 5 %
MCH: 32.3 pg (ref 26.0–34.0)
MCHC: 32.6 g/dL (ref 30.0–36.0)
MCV: 99.1 fL (ref 78.0–100.0)
MONO ABS: 0.3 10*3/uL (ref 0.1–1.0)
MONOS PCT: 4 %
NEUTROS PCT: 90 %
Neutro Abs: 7.8 10*3/uL — ABNORMAL HIGH (ref 1.7–7.7)
Platelets: 96 10*3/uL — ABNORMAL LOW (ref 150–400)
RBC: 3.44 MIL/uL — ABNORMAL LOW (ref 3.87–5.11)
RDW: 18.5 % — AB (ref 11.5–15.5)
WBC: 8.7 10*3/uL (ref 4.0–10.5)

## 2016-09-22 LAB — BLOOD GAS, ARTERIAL
ACID-BASE DEFICIT: 0.7 mmol/L (ref 0.0–2.0)
BICARBONATE: 23.6 mmol/L (ref 20.0–28.0)
Drawn by: 277331
FIO2: 0.5
LHR: 16 {breaths}/min
O2 SAT: 99.5 %
PEEP/CPAP: 5 cmH2O
PH ART: 7.36 (ref 7.350–7.450)
Patient temperature: 37
VT: 380 mL
pCO2 arterial: 43.5 mmHg (ref 32.0–48.0)
pO2, Arterial: 328 mmHg — ABNORMAL HIGH (ref 83.0–108.0)

## 2016-09-22 LAB — I-STAT CHEM 8, ED
BUN: 53 mg/dL — AB (ref 6–20)
CALCIUM ION: 0.9 mmol/L — AB (ref 1.15–1.40)
CHLORIDE: 96 mmol/L — AB (ref 101–111)
CREATININE: 12.4 mg/dL — AB (ref 0.44–1.00)
GLUCOSE: 164 mg/dL — AB (ref 65–99)
HCT: 36 % (ref 36.0–46.0)
Hemoglobin: 12.2 g/dL (ref 12.0–15.0)
Potassium: 3.7 mmol/L (ref 3.5–5.1)
Sodium: 135 mmol/L (ref 135–145)
TCO2: 27 mmol/L (ref 0–100)

## 2016-09-22 LAB — HCG, SERUM, QUALITATIVE: Preg, Serum: NEGATIVE

## 2016-09-22 LAB — CBG MONITORING, ED: Glucose-Capillary: 160 mg/dL — ABNORMAL HIGH (ref 65–99)

## 2016-09-22 LAB — SALICYLATE LEVEL

## 2016-09-22 LAB — COMPREHENSIVE METABOLIC PANEL
ALK PHOS: 127 U/L — AB (ref 38–126)
ALT: 32 U/L (ref 14–54)
ANION GAP: 20 — AB (ref 5–15)
AST: 61 U/L — ABNORMAL HIGH (ref 15–41)
Albumin: 4 g/dL (ref 3.5–5.0)
BUN: 62 mg/dL — ABNORMAL HIGH (ref 6–20)
CALCIUM: 9.1 mg/dL (ref 8.9–10.3)
CO2: 23 mmol/L (ref 22–32)
Chloride: 91 mmol/L — ABNORMAL LOW (ref 101–111)
Creatinine, Ser: 11.14 mg/dL — ABNORMAL HIGH (ref 0.44–1.00)
GFR calc non Af Amer: 4 mL/min — ABNORMAL LOW (ref >60)
GFR, EST AFRICAN AMERICAN: 4 mL/min — AB (ref >60)
Glucose, Bld: 159 mg/dL — ABNORMAL HIGH (ref 65–99)
POTASSIUM: 3.7 mmol/L (ref 3.5–5.1)
SODIUM: 134 mmol/L — AB (ref 135–145)
TOTAL PROTEIN: 8.7 g/dL — AB (ref 6.5–8.1)
Total Bilirubin: 1.6 mg/dL — ABNORMAL HIGH (ref 0.3–1.2)

## 2016-09-22 LAB — GLUCOSE, CAPILLARY: GLUCOSE-CAPILLARY: 117 mg/dL — AB (ref 65–99)

## 2016-09-22 LAB — I-STAT TROPONIN, ED
Troponin i, poc: 0.01 ng/mL (ref 0.00–0.08)
Troponin i, poc: 0.01 ng/mL (ref 0.00–0.08)

## 2016-09-22 LAB — ETHANOL

## 2016-09-22 LAB — APTT: APTT: 31 s (ref 24–36)

## 2016-09-22 LAB — I-STAT CG4 LACTIC ACID, ED
LACTIC ACID, VENOUS: 2 mmol/L — AB (ref 0.5–1.9)
Lactic Acid, Venous: 1.84 mmol/L (ref 0.5–1.9)

## 2016-09-22 LAB — PROTIME-INR
INR: 1.04
Prothrombin Time: 13.7 seconds (ref 11.4–15.2)

## 2016-09-22 LAB — ACETAMINOPHEN LEVEL: Acetaminophen (Tylenol), Serum: <10 ug/mL — ABNORMAL LOW (ref 10–30)

## 2016-09-22 LAB — AMMONIA: Ammonia: 78 umol/L — ABNORMAL HIGH (ref 9–35)

## 2016-09-22 MED ORDER — VANCOMYCIN HCL IN DEXTROSE 1-5 GM/200ML-% IV SOLN
1000.0000 mg | Freq: Once | INTRAVENOUS | Status: AC
  Administered 2016-09-22: 1000 mg via INTRAVENOUS
  Filled 2016-09-22: qty 200

## 2016-09-22 MED ORDER — ETOMIDATE 2 MG/ML IV SOLN
15.0000 mg | Freq: Once | INTRAVENOUS | Status: AC
  Administered 2016-09-22: 15 mg via INTRAVENOUS

## 2016-09-22 MED ORDER — PIPERACILLIN-TAZOBACTAM 3.375 G IVPB 30 MIN: 3.3750 g | Freq: Once | INTRAVENOUS | Status: DC

## 2016-09-22 MED ORDER — SODIUM CHLORIDE 0.9 % IV BOLUS (SEPSIS)
250.0000 mL | Freq: Once | INTRAVENOUS | Status: AC
Start: 2016-09-22 — End: 2016-09-22
  Administered 2016-09-22: 250 mL via INTRAVENOUS

## 2016-09-22 MED ORDER — PROPOFOL 1000 MG/100ML IV EMUL
INTRAVENOUS | Status: AC
  Administered 2016-09-22: 23:00:00
  Filled 2016-09-22: qty 100

## 2016-09-22 MED ORDER — MIDAZOLAM HCL 2 MG/2ML IJ SOLN
INTRAMUSCULAR | Status: AC
  Administered 2016-09-22: 22:00:00
  Filled 2016-09-22: qty 2

## 2016-09-22 MED ORDER — NALOXONE HCL 2 MG/2ML IJ SOSY
PREFILLED_SYRINGE | INTRAMUSCULAR | Status: AC
  Administered 2016-09-22: 2 mg via INTRAVENOUS
  Filled 2016-09-22: qty 2

## 2016-09-22 MED ORDER — PROPOFOL 1000 MG/100ML IV EMUL
5.0000 ug/kg/min | Freq: Once | INTRAVENOUS | Status: AC
  Administered 2016-09-22: 5 ug/kg/min via INTRAVENOUS

## 2016-09-22 MED ORDER — NALOXONE HCL 2 MG/2ML IJ SOSY
2.0000 mg | PREFILLED_SYRINGE | Freq: Once | INTRAMUSCULAR | Status: AC
  Administered 2016-09-22: 2 mg via INTRAVENOUS

## 2016-09-22 MED ORDER — LORAZEPAM 2 MG/ML IJ SOLN
INTRAMUSCULAR | Status: AC
  Filled 2016-09-22: qty 1

## 2016-09-22 MED ORDER — FENTANYL CITRATE (PF) 100 MCG/2ML IJ SOLN
50.0000 ug | Freq: Once | INTRAMUSCULAR | Status: AC
  Administered 2016-09-22: 50 ug via INTRAVENOUS
  Filled 2016-09-22: qty 2

## 2016-09-22 MED ORDER — LORAZEPAM 2 MG/ML IJ SOLN
2.0000 mg | Freq: Once | INTRAMUSCULAR | Status: AC
  Administered 2016-09-22: 2 mg via INTRAVENOUS

## 2016-09-22 MED ORDER — LACTULOSE 10 GM/15ML PO SOLN
30.0000 g | Freq: Once | ORAL | Status: AC
  Administered 2016-09-22: 30 g via ORAL
  Filled 2016-09-22: qty 60

## 2016-09-22 MED ORDER — ROCURONIUM BROMIDE 50 MG/5ML IV SOLN
1.0000 mg/kg | Freq: Once | INTRAVENOUS | Status: AC
  Administered 2016-09-22: 72.6 mg via INTRAVENOUS
  Filled 2016-09-22: qty 7.26

## 2016-09-22 MED ORDER — PROPOFOL 1000 MG/100ML IV EMUL
INTRAVENOUS | Status: AC
  Administered 2016-09-22: 5 ug/kg/min via INTRAVENOUS
  Filled 2016-09-22: qty 100

## 2016-09-22 NOTE — ED Triage Notes (Signed)
Pt is dialysis pt and hasn't had dialysis in over a week, per boyfriend it is because she hasn't felt well and wasn't motivated. Pt went to dialysis today and per dialysis staff pt was slightly combative and confused when she arrived. During dialysis treatment pt went unresponsive and EMS was called. Pt had jerking movements during sternal rub by EMS. No purposeful movements. Per EMS, pt has left sided gaze. HR tachy 120 for EMS. BP 126/68 per EMS.

## 2016-09-22 NOTE — ED Notes (Signed)
Lab at bedside

## 2016-09-22 NOTE — ED Notes (Signed)
Pt has no urine from catheter at this time.

## 2016-09-22 NOTE — H&P (Signed)
PULMONARY / CRITICAL CARE MEDICINE   Name: Wanda [name] MRN: [id number] DOB: 08/21/1975    ADMISSION DATE:  09/22/2016 CONSULTATION DATE:  2/13  REFERRING MD:  Dr. 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 She has reportedly not been to dialysis for over one week because she was feeling "unwell and unmotivated". She did go to HD 2/13, but was very confused and combative while there. She had 2.7 L dialyzed and subsequently became unresponsive prompting EMS to be called.  On arrival to ED she was noted to have GCS 3 and was intubated for airway protection. She had some clonus of lower extremities raising concern for hepatic encephalopathy, also some clonic jerking of upper extremities with painful stimuli. Exam also demonstrated hypothermia, tachycardia, and normotension. CT head and CXR were non-acute. Unable to collect urine for UA. She was started on broad spectrum antibiotics and was transferred to [facility] for further evaluation.   PAST MEDICAL HISTORY :  She  has a past medical history of Acute blood loss anemia (02/25/2014); Acute renal failure ([place]) (09/2013); Anasarca (10/10/2013); Anxiety; Bipolar disorder ([place]) (12/04/2013); Bleeding esophageal varices ([facility]) (02/28/2014); C. difficile colitis (04/19/2014); Chronic hypotension; Cirrhosis ([place]) (10/05/13); Cirrhosis of liver with ascites ([place]); Depression; ESRD (end stage renal disease) on dialysis ([place]) (08/2014); Folate deficiency (09/2013); Gastroesophageal junction ulcer (09/17/2014); GERD (gastroesophageal reflux disease); Hematemesis/vomiting blood (02/24/2014); Macrocytosis (02/28/2014); PNA (pneumonia) (10/13/2013); SBP (spontaneous bacterial peritonitis) ([place]) (11/10/2013); and Thrombocytopenia ([place]).  PAST SURGICAL HISTORY: She  has a past surgical history that  includes None; Paracentesis (Feb 2015); Esophagogastroduodenoscopy (N/A, 11/14/2013); Paracentesis (10/2013); Colonoscopy (N/A, 12/19/2013); Esophagogastroduodenoscopy (N/A, 02/11/2014); Esophagogastroduodenoscopy (N/A, 07/04/2014); esophageal banding (07/04/2014); Esophagogastroduodenoscopy (egd) with propofol (N/A, 07/24/2014); esophageal banding (N/A, 07/24/2014); Central venous catheter insertion (Right); Esophagogastroduodenoscopy (N/A, 09/16/2014); Esophagogastroduodenoscopy (egd) with propofol (N/A, 10/26/2014); AV fistula placement (Right, 11/16/2014); Esophagogastroduodenoscopy (N/A, 12/14/2014); Esophagogastroduodenoscopy (N/A, 03/27/2015); Esophagogastroduodenoscopy (N/A, 06/05/2015); Esophagogastroduodenoscopy (N/A, 07/23/2015); esophageal banding (N/A, 07/23/2015); Esophagogastroduodenoscopy (N/A, 03/04/2016); esophageal banding (N/A, 03/04/2016); Esophagogastroduodenoscopy (egd) with propofol (N/A, 04/30/2016); esophageal banding (N/A, 04/30/2016); and Esophagogastroduodenoscopy (N/A, 05/21/2016).  Allergies  Allergen Reactions  . Lasix [Furosemide] Other (See Comments)    "doesn't work"  . Latex Itching    No current facility-administered medications on file prior to encounter.    Current Outpatient Prescriptions on File Prior to Encounter  Medication Sig  . clotrimazole (LOTRIMIN) 1 % cream APPLY TOPICALLY THREE TIMES DAILY FOR 10 DAYS  . Darbepoetin Alfa (ARANESP) 60 MCG/0.3ML SOSY injection Inject 0.3 mLs (60 mcg total) into the vein every Thursday with hemodialysis. (Patient taking differently: Inject 60 mcg into the vein every Tuesday with hemodialysis. )  . lactulose (CHRONULAC) 10 GM/15ML solution TAKE 15-30 ml BY MOUTH three times a day  . lidocaine (XYLOCAINE) 2 % solution TAKE TWO TEASPOONSFUL (10ML) BY MOUTH BEFORE MEALS AND AT BEDTIME TO PREVENT CHEST PAIN WHILE EATING  . lidocaine-prilocaine (EMLA) cream Apply 1 application topically as needed (for dialysis treatments).  . LORazepam  (ATIVAN) 1 MG tablet TAKE 1/2 TO 1 TABLET BY MOUTH TWICE DAILY AS NEEDED FOR BACK SPASM OR FOR ANXIETY (Patient taking differently: Take 0.5-1 mg by mouth 2 (two) times daily as needed for anxiety (or back spasms). )  . midodrine (PROAMATINE) 10 MG tablet Take 1 tablet (10 mg total) by mouth daily. *Takes only  on dialysis days on Tuesdays, Thursdays, and Saturdays (Sometimes takes treatments on Friday instead of Saturday)**Medication to keep your blood pressure from falling. (Patient taking differently: Take 10 mg by mouth See admin instructions. Take 10 mg by mouth on dialysis days on Tuesdays, Thursdays, and Saturdays (Sometimes takes treatments on Friday instead of Saturday))  . Oxycodone HCl 10 MG TABS 1 PO TID AS NEEDED FOR PAIN  . oxyCODONE-acetaminophen (PERCOCET/ROXICET) 5-325 MG tablet Take 1-2 tablets by mouth every 6 (six) hours as needed for severe pain.  [name] RENVELA 800 MG tablet Take 1600 mg by mouth 3 times daily with meals. Take 800 mg by mouth with snacks.  . sucralfate (CARAFATE) 1 GM/10ML suspension Take 10 mLs (1 g total) by mouth 4 (four) times daily -  with meals and at bedtime.    FAMILY HISTORY:  Her indicated that her mother is deceased. She indicated that her father is deceased. She indicated that her sister is alive. She indicated that her brother is alive. She indicated that the status of her neg hx is unknown.    SOCIAL HISTORY: She  reports that she quit smoking about 7 years ago. Her smoking use included Cigarettes. She has a 5.00 pack-year smoking history. She has never used smokeless tobacco. She reports that she does not drink alcohol or use drugs.  REVIEW OF SYSTEMS:   unable  SUBJECTIVE:  unable  VITAL SIGNS: BP (!) 106/40   Pulse 119   Temp 99 F (37.2 C) (Oral)   Resp (!) 24   Ht 5\' 1"  (1.549 m)   Wt 72.6 kg (160 lb)   LMP 09/21/2013   SpO2 100%   BMI 30.23 kg/m   HEMODYNAMICS:    VENTILATOR SETTINGS: Vent Mode: PRVC FiO2 (%):  [50 %] 50  % Set Rate:  [16 bmp] 16 bmp Vt Set:  [380 mL-420 mL] 380 mL PEEP:  [5 cmH20] 5 cmH20 Plateau Pressure:  [13 cmH20] 13 cmH20  INTAKE / OUTPUT: No intake/output data recorded.  PHYSICAL EXAMINATION: General:  Female of normal body habitus resting in bed on vent Neuro:  Sedated on vent. With propofol off she has full body intermittent tremors. Does not resemble seizure.  HEENT:  [place]/AT, PERRL, no JVD Cardiovascular:  Tachy, regular, no MRG. +3 pitting BLE edema Lungs:  Clear Abdomen:  Soft, non-distended Musculoskeletal:  No acute deformity.  Skin:  Grossly intact  LABS:  BMET  Recent Labs Lab 09/19/16 1417 09/22/16 1712 09/22/16 1715  NA 136 135 134*  K 4.5 3.7 3.7  CL 93* 96* 91*  CO2 21*  --  23  BUN 129* 53* 62*  CREATININE 18.82* 12.40* 11.14*  GLUCOSE 133* 164* 159*    Electrolytes  Recent Labs Lab 09/19/16 1417 09/22/16 1715  CALCIUM 9.5 9.1    CBC  Recent Labs Lab 09/19/16 1417 09/22/16 1712 09/22/16 1715  WBC 5.2  --  8.7  HGB 10.1* 12.2 11.1*  HCT 31.5* 36.0 34.1*  PLT 75*  --  96*    Coag's  Recent Labs Lab 09/22/16 1715  APTT 31  INR 1.04    Sepsis Markers  Recent Labs Lab 09/22/16 1716 09/22/16 1729  LATICACIDVEN 1.84 2.00*    ABG  Recent Labs Lab 09/22/16 1732  PHART 7.360  PCO2ART 43.5  PO2ART 328*    Liver Enzymes  Recent Labs Lab 09/19/16 1417 09/22/16 1715  AST 74* 61*  ALT 34 32  ALKPHOS 148* 127*  BILITOT 1.6* 1.6*  ALBUMIN  3.8 4.0    Cardiac Enzymes No results for input(s): TROPONINI, PROBNP in the last 168 hours.  Glucose  Recent Labs Lab 09/22/16 1701 09/22/16 2309  GLUCAP 160* 117*    Imaging Ct Head Wo Contrast  Result Date: 09/22/2016 CLINICAL DATA:  Altered mental status/confusion EXAM: CT HEAD WITHOUT CONTRAST TECHNIQUE: Contiguous axial images were obtained from the base of the skull through the vertex without intravenous contrast. COMPARISON:  November 05, 2013 FINDINGS: Brain:  The ventricles are normal in size and configuration. There is no intracranial mass, hemorrhage, extra-axial fluid collection, or midline shift. Gray-white compartments appear normal. No acute infarct evident. Vascular: No hyperdense vessel. There is calcification in each carotid siphon region. Skull:  Bony calvarium appears intact. Sinuses/Orbits: Visualized paranasal sinuses are clear. Visualized orbits appear symmetric bilaterally. Other:  Visualized mastoid air cells are clear. IMPRESSION: Calcification in each carotid siphon region. No intracranial mass, hemorrhage, or extra-axial fluid collection. Gray-white compartments appear normal. Electronically Signed   By: Lowella Grip III M.D.   On: 09/22/2016 18:51   Dg Chest Portable 1 View  Result Date: 09/22/2016 CLINICAL DATA:  Endotracheal tube placement EXAM: PORTABLE CHEST 1 VIEW COMPARISON:  Chest radiograph 09/19/2016 FINDINGS: Endotracheal tube tip is just below the level of the clavicular heads. Enteric tube courses beyond the diaphragm. There is shallow lung inflation with a small left pleural effusion and associated basilar atelectasis. Medial right basilar opacities are also favored to indicate atelectasis. No pneumothorax. No pulmonary edema. IMPRESSION: 1. Endotracheal tube tip in radiographically appropriate position. 2. Small left pleural effusion and bibasilar atelectasis. 3. Suspect right basilar atelectasis, though developing infection may have the same appearance. Electronically Signed   By: Ulyses Jarred M.D.   On: 09/22/2016 18:01     STUDIES:  CT head 2/13 > Calcification in each carotid siphon region. No intracranial mass, hemorrhage, or extra-axial fluid collection. Gray-white compartments appear normal.  CULTURES: Blood 2/13 > Urine 2/13 > Tracheal asp 2/13 > Flu PCR 2/13 > RVP 2/13 >  ANTIBIOTICS: Zosyn 2/13 > Vancomycin  2/13 >  SIGNIFICANT EVENTS: 2/13 admit for AMS, on vent  LINES/TUBES: ETT 2/13  >   ASSESSMENT / PLAN:  PULMONARY A: Inability to protect airway due to encephalopathy  P:   Full vent support ABG CXR VAP prevention per protocol Daily SBT and WUA  CARDIOVASCULAR A:  Chronic hypotension  P:  Telemetry monitoring  RENAL A:   ESRD on HD (tues, thurs, sat)  P:   Consult nephrology in AM Follow BMP  GASTROINTESTINAL A:   Hepatic cirrhosis Ascites H/o Varices  P:   NPO Protonix Consider [place] abdo  HEMATOLOGIC A:   Thrombocytopenia ([place]) Anemia (chronic)  P:  Follow CBC  INFECTIOUS A:   SIRS/Sepsis - source unclear, consider SBP  P:   ABX as above Consider paracentesis for culture  ENDOCRINE A:   Hyperglycemia  P:   CBG monitoring and SSI  NEUROLOGIC A:   Acute metabolic vs hepatic encephalopathy (more likely) Tremors vs ? Seizures, doubt P:   RASS goal: -1 to -2 Lactulose EEG in AM to r/o seizures Propofol infusion   FAMILY  - Updates: no family present  - Inter-disciplinary family meet or Palliative Care meeting due by:  2/19   Georgann Housekeeper, AGACNP-BC [place] [facility] Pager [phone] or [phone]  09/23/2016 12:41 AM

## 2016-09-22 NOTE — ED Provider Notes (Signed)
[facility] DEPT Provider Note   CSN: [id number] Arrival date & time: 09/22/16  1656     History   Chief Complaint Chief Complaint  Patient presents with  . Altered Mental Status    HPI Wanda [name] is a 41 y.o. female.  HPI Level V caveat due to AMS. History of cirrhosis c/b ascites and EV and ESRD on HD Tue, Th, Sat. History is provided by EMS who states that patient has not been to dialysis in over one week. According to her boyfriend, she is not been feeling well and just was not motivated. She did go for dialysis today, and staff felt that she was very confused and combative. She did undergo 2.7 L of dialysis, but subsequently became unresponsive and EMS was called. POCT glucose 130 prior to arrival.   Per patient's fiance, this morning was shakey and tremoring. Over course of day a little confused and aggitated. Was seen in ED a few days ago for abdominal pain, but taking oxycodone at home and no abdominal pain recently. No nausea, vomiting, fever or chills. No melena or hematochezia.   Past Medical History:  Diagnosis Date  . Acute blood loss anemia 02/25/2014   Status post transfusion  . Acute renal failure ([place]) 09/2013   Pre-renal- resolved  . Anasarca 10/10/2013  . Anxiety   . Bipolar disorder ([place]) 12/04/2013   2007-SEEN IN ED FOR INVOLUNTARY COMMITMENT, UDS POS FOR AMPHETAMINES/OPIATES   . Bleeding esophageal varices ([place]) 02/28/2014   s/p banding  . C. difficile colitis 04/19/2014  . Chronic hypotension   . Cirrhosis ([place]) 10/05/13   Liver bx 11/23/13 (delayed initially due to patient refusal). c/w steatohepatitis  . Cirrhosis of liver with ascites ([place])   . Depression   . ESRD (end stage renal disease) on dialysis ([place]) 08/2014  . Folate deficiency 09/2013  . Gastroesophageal junction ulcer 09/17/2014  . GERD (gastroesophageal reflux disease)   . Hematemesis/vomiting blood 02/24/2014  . Macrocytosis 02/28/2014  . PNA (pneumonia) 10/13/2013  . SBP (spontaneous  bacterial peritonitis) ([place]) 11/10/2013  . Thrombocytopenia ([place])    Hypercellular bone marrow; abundant megakaryocytes per 08/27/2014; s/p bone marrow bx    Patient Active Problem List   Diagnosis Date Noted  . Pressure injury of skin 05/20/2016  . Shock ([place]) 05/19/2016  . Mallory-Weiss tear   . Rhonchi   . Alcoholic cirrhosis of liver with ascites ([place])   . Chronic pain syndrome 08/22/2015  . ESRD (end stage renal disease) ([place]) 08/20/2015  . Abdominal wall cellulitis 08/20/2015  . Chronic abdominal pain 08/07/2015  . Umbilical hernia without obstruction and without gangrene 08/07/2015  . Liver cirrhosis secondary to NASH ([place])   . Acute blood loss anemia   . Hypotension 06/05/2015  . Edema of left lower extremity 02/22/2015  . UGIB (upper gastrointestinal bleed) 09/16/2014  . ESRD (end stage renal disease) on dialysis ([place])   . Melena   . Macrocytosis 09/12/2014  . Thrombocytopenia ([place]) 08/19/2014  . Esophageal varices without bleeding ([place])   . Portal hypertensive gastropathy   . Malnutrition of moderate degree ([place]) 05/15/2014  . Hepatic encephalopathy ([place]) 05/14/2014  . C. difficile colitis 04/19/2014  . Bipolar disorder ([place]) 12/04/2013  . SBP (spontaneous bacterial peritonitis) ([place]) 11/10/2013  . Liver cirrhosis secondary to nonalcoholic steatohepatitis (NASH) ([place]) 11/07/2013  . PNA (pneumonia) 10/13/2013  . Folate deficiency 10/13/2013  . Ascites 09/11/2013    Past Surgical History:  Procedure Laterality Date  . AV FISTULA PLACEMENT Right 11/16/2014  Procedure: Right arm Creation of arteriovenous fistula;  Surgeon: Angelia Mould, MD;  Location: [facility];  Service: Vascular;  Laterality: Right;  . CENTRAL VENOUS CATHETER INSERTION Right   . COLONOSCOPY N/A 12/19/2013   SLF:NO OBVIOUS SOURCE FOR ANEMIA IDETIFIED/ONE COLON POLYP REMOVED/Small internal hemorrhoids  . ESOPHAGEAL BANDING  07/04/2014   Procedure: ESOPHAGEAL BANDING;  Surgeon: Daneil Dolin, MD;   [facility];  Service: Endoscopy;;  . ESOPHAGEAL BANDING N/A 07/24/2014   Procedure: ESOPHAGEAL BANDING (2 bands applied);  Surgeon: Danie Binder, MD;  Location: [facility];  Service: Endoscopy;  Laterality: N/A;  . ESOPHAGEAL BANDING N/A 07/23/2015   Procedure: ESOPHAGEAL BANDING;  Surgeon: Danie Binder, MD;  [facility];  Service: Endoscopy;  Laterality: N/A;  . ESOPHAGEAL BANDING N/A 03/04/2016   Procedure: ESOPHAGEAL BANDING;  Surgeon: Danie Binder, MD;  [facility];  Service: Endoscopy;  Laterality: N/A;  . ESOPHAGEAL BANDING N/A 04/30/2016   Procedure: ESOPHAGEAL BANDING;  Surgeon: Daneil Dolin, MD;  [facility];  Service: Endoscopy;  Laterality: N/A;  . ESOPHAGOGASTRODUODENOSCOPY N/A 11/14/2013   SLF:1 column of very small varices in distal esopahgus/MODERATE PORTAL GASTROPATHY IN PROXIMAL STOMACH/MODERATE erosive gastritis  . ESOPHAGOGASTRODUODENOSCOPY N/A 02/11/2014   Dr. Rourk:Esophageal varices with bleeding stigmata-status post esophageal band ligation therapy. Portal gastropathy  . ESOPHAGOGASTRODUODENOSCOPY N/A 07/04/2014   RMR: Persiting grade 2 esophageal varicies with bleeding stigmata status post band ligation. Significantly congested gastric mucosa iwith changes constistant with protal gastropathy.   . ESOPHAGOGASTRODUODENOSCOPY N/A 09/16/2014   Rehman: Single short column of varix proximal to GE junction not large enough to be banded. Two amall ulcers at the GEJ felt to be source of GI Bleeding but no active bleeding but no actibe bleeding noted. No therapy rendered. Portal gastropathy NO evidence of peptic ulcer diease or gastric varices.   . ESOPHAGOGASTRODUODENOSCOPY N/A 12/14/2014   SLF: Grade ! esophageal varices. 2. Moderate Portal Gastropathy  . ESOPHAGOGASTRODUODENOSCOPY N/A 03/27/2015   Procedure: ESOPHAGOGASTRODUODENOSCOPY (EGD);  Surgeon: Danie Binder, MD;  [facility];  Service: Endoscopy;  Laterality: N/A;   1045am - moved to 817 @ 11:30  . ESOPHAGOGASTRODUODENOSCOPY N/A 06/05/2015   Procedure: ESOPHAGOGASTRODUODENOSCOPY (EGD);  Surgeon: Clarene Essex, MD;  Location: [facility] END[facility]COPY;  Service: Endoscopy;  Laterality: N/A;  . ESOPHAGOGASTRODUODENOSCOPY N/A 07/23/2015   SLF:1. Grade 1 esophageal varices 2. Moderate portal hypertensive gastropathy 3. MILd non-erosive gastritis.   [name] ESOPHAGOGASTRODUODENOSCOPY N/A 03/04/2016   Dr. Oneida Alar: grade 1 varices, surveillance in Jan 2017   . ESOPHAGOGASTRODUODENOSCOPY N/A 05/21/2016   Procedure: ESOPHAGOGASTRODUODENOSCOPY (EGD);  Surgeon: Gatha Mayer, MD;  Location[facility] ENDOSCOPY;  Service: Endoscopy;  Laterality: N/A;  . ESOPHAGOGASTRODUODENOSCOPY (EGD) WITH PROPOFOL N/A 07/24/2014   SLF:  1. 2 columns grade 2-3 varices- 2 Bands applied.  2.  Moderate gastropathy 3. Duodenal Diverticula  . ESOPHAGOGASTRODUODENOSCOPY (EGD) WITH PROPOFOL N/A 10/26/2014   RMR: 2 columns of grade 2 esophageal varices without obvious bleeding stigmata status post band ligation to complet obliteration of remaining varices.   . ESOPHAGOGASTRODUODENOSCOPY (EGD) WITH PROPOFOL N/A 04/30/2016   Procedure: ESOPHAGOGASTRODUODENOSCOPY (EGD) WITH PROPOFOL;  Surgeon: Daneil Dolin, MD;  [facility];  Service: Endoscopy;  Laterality: N/A;  . None    . PARACENTESIS  Feb 2015   1180 fluid, negative fluid analysis.   [name] PARACENTESIS  10/2013    OB History    Gravida Para Term Preterm AB Living   1  1 1     1    SAB TAB Ectopic Multiple Live Births                   Home Medications    Prior to Admission medications   Medication Sig Start Date End Date Taking? Authorizing Provider  clotrimazole (LOTRIMIN) 1 % cream APPLY TOPICALLY THREE TIMES DAILY FOR 10 DAYS 09/11/16   Annitta Needs, NP  Darbepoetin Alfa (ARANESP) 60 MCG/0.3ML SOSY injection Inject 0.3 mLs (60 mcg total) into the vein every Thursday with hemodialysis. Patient taking differently: Inject 60 mcg into the vein every  Tuesday with hemodialysis.  06/09/15   Cherene Altes, MD  lactulose (CHRONULAC) 10 GM/15ML solution TAKE 15-30 ml BY MOUTH three times a day 09/08/16   Annitta Needs, NP  lidocaine (XYLOCAINE) 2 % solution TAKE TWO TEASPOONSFUL (10ML) BY MOUTH BEFORE MEALS AND AT BEDTIME TO PREVENT CHEST PAIN WHILE EATING 03/19/16   Carlis Stable, NP  lidocaine-prilocaine (EMLA) cream Apply 1 application topically as needed (for dialysis treatments).    Historical Provider, MD  LORazepam (ATIVAN) 1 MG tablet TAKE 1/2 TO 1 TABLET BY MOUTH TWICE DAILY AS NEEDED FOR BACK SPASM OR FOR ANXIETY Patient taking differently: Take 0.5-1 mg by mouth 2 (two) times daily as needed for anxiety (or back spasms).  01/29/16   Danie Binder, MD  midodrine (PROAMATINE) 10 MG tablet Take 1 tablet (10 mg total) by mouth daily. *Takes only on dialysis days on Tuesdays, Thursdays, and Saturdays (Sometimes takes treatments on Friday instead of Saturday)**Medication to keep your blood pressure from falling. Patient taking differently: Take 10 mg by mouth See admin instructions. Take 10 mg by mouth on dialysis days on Tuesdays, Thursdays, and Saturdays (Sometimes takes treatments on Friday instead of Saturday) 06/09/15   Cherene Altes, MD  Oxycodone HCl 10 MG TABS 1 PO TID AS NEEDED FOR PAIN 08/19/16   Danie Binder, MD  oxyCODONE-acetaminophen (PERCOCET/ROXICET) 5-325 MG tablet Take 1-2 tablets by mouth every 6 (six) hours as needed for severe pain. 09/19/16   Fredia Sorrow, MD  RENVELA 800 MG tablet Take 1600 mg by mouth 3 times daily with meals. Take 800 mg by mouth with snacks. 10/29/15   Historical Provider, MD  sucralfate (CARAFATE) 1 GM/10ML suspension Take 10 mLs (1 g total) by mouth 4 (four) times daily -  with meals and at bedtime. 05/27/16   Elmarie Shiley, MD    Family History Family History  Problem Relation Age of Onset  . Heart disease Mother   . Colon cancer Neg Hx   . Liver disease Neg Hx     Social  History Social History  Substance Use Topics  . Smoking status: Former Smoker    Packs/day: 0.25    Years: 20.00    Types: Cigarettes    Quit date: 11/05/2008  . Smokeless tobacco: Never Used     Comment: Never really smoked much  . Alcohol use No     Allergies   Lasix [furosemide] and Latex   Review of Systems Review of Systems Unable to be obtained due to altered mental status  Physical Exam Updated Vital Signs BP 145/71   Pulse 120   Temp 97.3 F (36.3 C)   Resp (!) 28   Ht 5\' 1"  (1.549 m)   Wt 160 lb (72.6 kg)   LMP 09/21/2013   SpO2 100%   BMI 30.23 kg/m   Physical Exam Physical Exam  Nursing note and vitals reviewed. Constitutional: ill appearing, unresponsive Head: Normocephalic and atraumatic.  Mouth/Throat: Oropharynx is clear and moist.  Neck: Neck supple.  Cardiovascular: Tachycardic rate and regular rhythm.  +1 bilateral lower extremity edema Pulmonary/Chest: Tachypnea and breath sounds coarse bilaterally.  Abdominal: Soft. There is abdominal distension. No tenderness. There is no rebound and no guarding.  Musculoskeletal: No deformities. Neurological: GCS 3, left gaze deviation, brief myoclonic jerking with sternal rub, increased tone in bilateral upper and lower extremities, no spontaneous movements otherwise. Skin: Skin is warm and dry.  Psychiatric: Cooperative   ED Treatments / Results  Labs (all labs ordered are listed, but only abnormal results are displayed) Labs Reviewed  CBC WITH DIFFERENTIAL/PLATELET - Abnormal; Notable for the following:       Result Value   RBC 3.44 (*)    Hemoglobin 11.1 (*)    HCT 34.1 (*)    RDW 18.5 (*)    Platelets 96 (*)    Neutro Abs 7.8 (*)    Lymphs Abs 0.5 (*)    All other components within normal limits  COMPREHENSIVE METABOLIC PANEL - Abnormal; Notable for the following:    Sodium 134 (*)    Chloride 91 (*)    Glucose, Bld 159 (*)    BUN 62 (*)    Creatinine, Ser 11.14 (*)    Total Protein 8.7  (*)    AST 61 (*)    Alkaline Phosphatase 127 (*)    Total Bilirubin 1.6 (*)    GFR calc non Af Amer 4 (*)    GFR calc Af Amer 4 (*)    Anion gap 20 (*)    All other components within normal limits  AMMONIA - Abnormal; Notable for the following:    Ammonia 78 (*)    All other components within normal limits  ACETAMINOPHEN LEVEL - Abnormal; Notable for the following:    Acetaminophen (Tylenol), Serum <10 (*)    All other components within normal limits  BLOOD GAS, ARTERIAL - Abnormal; Notable for the following:    pO2, Arterial 328 (*)    All other components within normal limits  CBG MONITORING, ED - Abnormal; Notable for the following:    Glucose-Capillary 160 (*)    All other components within normal limits  I-STAT CHEM 8, ED - Abnormal; Notable for the following:    Chloride 96 (*)    BUN 53 (*)    Creatinine, Ser 12.40 (*)    Glucose, Bld 164 (*)    Calcium, Ion 0.90 (*)    All other components within normal limits  I-STAT CG4 LACTIC ACID, ED - Abnormal; Notable for the following:    Lactic Acid, Venous 2.00 (*)    All other components within normal limits  CULTURE, BLOOD (ROUTINE X 2)  CULTURE, BLOOD (ROUTINE X 2)  PROTIME-INR  APTT  SALICYLATE LEVEL  ETHANOL  HCG, SERUM, QUALITATIVE  RAPID URINE DRUG SCREEN, HOSP PERFORMED  URINALYSIS, ROUTINE W REFLEX MICROSCOPIC  I-STAT TROPOININ, ED  I-STAT CG4 LACTIC ACID, ED  I-STAT TROPOININ, ED  I-STAT CG4 LACTIC ACID, ED    EKG  EKG Interpretation  Date/Time:  Tuesday September 22 2016 17:00:22 EST Ventricular Rate:  106 PR Interval:    QRS Duration: 116 QT Interval:  343 QTC Calculation: 456 R Axis:   73 Text Interpretation:  Sinus tachycardia Right atrial enlargement Nonspecific intraventricular conduction delay Repol abnrm suggests ischemia, lateral leads similar to prior EKG  Confirmed by Cadan Maggart MD,  Malin Sambrano [phone]) on 09/22/2016 5:32:55 PM       Radiology Ct Head Wo Contrast  Result Date: 09/22/2016 CLINICAL  DATA:  Altered mental status/confusion EXAM: CT HEAD WITHOUT CONTRAST TECHNIQUE: Contiguous axial images were obtained from the base of the skull through the vertex without intravenous contrast. COMPARISON:  November 05, 2013 FINDINGS: Brain: The ventricles are normal in size and configuration. There is no intracranial mass, hemorrhage, extra-axial fluid collection, or midline shift. Gray-white compartments appear normal. No acute infarct evident. Vascular: No hyperdense vessel. There is calcification in each carotid siphon region. Skull:  Bony calvarium appears intact. Sinuses/Orbits: Visualized paranasal sinuses are clear. Visualized orbits appear symmetric bilaterally. Other:  Visualized mastoid air cells are clear. IMPRESSION: Calcification in each carotid siphon region. No intracranial mass, hemorrhage, or extra-axial fluid collection. Gray-white compartments appear normal. Electronically Signed   By: Lowella Grip III M.D.   On: 09/22/2016 18:51   Dg Chest Portable 1 View  Result Date: 09/22/2016 CLINICAL DATA:  Endotracheal tube placement EXAM: PORTABLE CHEST 1 VIEW COMPARISON:  Chest radiograph 09/19/2016 FINDINGS: Endotracheal tube tip is just below the level of the clavicular heads. Enteric tube courses beyond the diaphragm. There is shallow lung inflation with a small left pleural effusion and associated basilar atelectasis. Medial right basilar opacities are also favored to indicate atelectasis. No pneumothorax. No pulmonary edema. IMPRESSION: 1. Endotracheal tube tip in radiographically appropriate position. 2. Small left pleural effusion and bibasilar atelectasis. 3. Suspect right basilar atelectasis, though developing infection may have the same appearance. Electronically Signed   By: Ulyses Jarred M.D.   On: 09/22/2016 18:01    Procedures Procedures (including critical care time) CRITICAL CARE Performed by: Forde Dandy   Total critical care time: 50 minutes  Critical care time was  exclusive of separately billable procedures and treating other patients.  Critical care was necessary to treat or prevent imminent or life-threatening deterioration.  Critical care was time spent personally by me on the following activities: development of treatment plan with patient and/or surrogate as well as nursing, discussions with consultants, evaluation of patient's response to treatment, examination of patient, obtaining history from patient or surrogate, ordering and performing treatments and interventions, ordering and review of laboratory studies, ordering and review of radiographic studies, pulse oximetry and re-evaluation of patient's condition.   INTUBATION Performed by: Forde Dandy  Required items: required blood products, implants, devices, and special equipment available Patient identity confirmed: provided demographic data and hospital-assigned identification number Time out: Immediately prior to procedure a "time out" was called to verify the correct patient, procedure, equipment, support staff and site/side marked as required.  Indications: airway protection  Intubation method: Glidescope Laryngoscopy   Preoxygenation: BVM  Sedatives: Etomidate Paralytic: rocuronium Tube Size: 7.0 cuffed  Post-procedure assessment: chest rise and ETCO2 monitor Breath sounds: equal and absent over the epigastrium Tube secured with: ETT holder Chest x-ray interpreted by radiologist and me.  Chest x-ray findings: endotracheal tube in appropriate position  Patient tolerated the procedure well with no immediate complications.    Medications Ordered in ED Medications  vancomycin (VANCOCIN) IVPB 1000 mg/200 mL premix (not administered)  piperacillin-tazobactam (ZOSYN) IVPB 3.375 g (not administered)  lactulose (CHRONULAC) 10 GM/15ML solution 30 g (not administered)  naloxone (NARCAN) injection 2 mg (2 mg Intravenous Given 09/22/16 1715)  LORazepam (ATIVAN) injection 2 mg (2 mg  Intravenous Given 09/22/16 1711)  rocuronium (ZEMURON) injection 72.6 mg (72.6 mg Intravenous Given 09/22/16 1726)  etomidate (AMIDATE) injection 15 mg (  15 mg Intravenous Given by Other 09/22/16 1725)  propofol (DIPRIVAN) 1000 MG/100ML infusion (0 mcg/kg/min  72.6 kg Intravenous Paused 09/22/16 1849)     Initial Impression / Assessment and Plan / ED Course  I have reviewed the triage vital signs and the nursing notes.  Pertinent labs & imaging results that were available during my care of the patient were reviewed by me and considered in my medical decision making (see chart for details).      Presenting with altered mental status. On presentation with GCS of 3 and subsequently intubated for airway protection. Concern for hepatic encephalopathy as clonus noted in bilateral lower extremities and with sternal rub there is myoclonic jerking of the upper extremities. Not necessarily consistent with seizure activity, but may require EEG. CT head visualized and w/o acute processes. Dose of lactulose given.   Is also hypothermic, tachycardic, but normotensive. Empirically covered with antibiotics given that she is critically ill. Blood cultures are pending. No significant urine for urinalysis and culture. Chest x-ray visualized without infiltrate/pneumonia. Her lactate is not significantly elevated. No major electrolyte or metabolic derangements.   Discussed with Dr. Emmit Alexanders, who will admit to ICU as Zacarias Pontes.   Final Clinical Impressions(s) / ED Diagnoses   Final diagnoses:  Altered mental status, unspecified altered mental status type  Hepatic encephalopathy [facility])    New Prescriptions New Prescriptions   No medications on file     Forde Dandy, MD 09/22/16 2032

## 2016-09-23 ENCOUNTER — Ambulatory Visit (HOSPITAL_COMMUNITY): Admission: RE | Admit: 2016-09-23 | Payer: Medicare Other | Source: Ambulatory Visit

## 2016-09-23 ENCOUNTER — Telehealth: Payer: Self-pay | Admitting: Gastroenterology

## 2016-09-23 ENCOUNTER — Inpatient Hospital Stay (HOSPITAL_COMMUNITY): Payer: Medicare Other

## 2016-09-23 DIAGNOSIS — R4182 Altered mental status, unspecified: Secondary | ICD-10-CM

## 2016-09-23 DIAGNOSIS — K7290 Hepatic failure, unspecified without coma: Secondary | ICD-10-CM

## 2016-09-23 DIAGNOSIS — K7031 Alcoholic cirrhosis of liver with ascites: Secondary | ICD-10-CM

## 2016-09-23 LAB — ALBUMIN, PLEURAL OR PERITONEAL FLUID: Albumin, Fluid: 1.6 g/dL

## 2016-09-23 LAB — BODY FLUID CELL COUNT WITH DIFFERENTIAL
Eos, Fluid: 0 %
Lymphs, Fluid: 47 %
MONOCYTE-MACROPHAGE-SEROUS FLUID: 43 % — AB (ref 50–90)
Neutrophil Count, Fluid: 10 % (ref 0–25)
WBC FLUID: 49 uL (ref 0–1000)

## 2016-09-23 LAB — BLOOD GAS, ARTERIAL
ACID-BASE EXCESS: 2.2 mmol/L — AB (ref 0.0–2.0)
Bicarbonate: 25.4 mmol/L (ref 20.0–28.0)
DRAWN BY: 249101
FIO2: 40
MECHVT: 380 mL
O2 Saturation: 98 %
PEEP/CPAP: 5 cmH2O
Patient temperature: 98.6
RATE: 16 resp/min
pCO2 arterial: 34.2 mmHg (ref 32.0–48.0)
pH, Arterial: 7.485 — ABNORMAL HIGH (ref 7.350–7.450)
pO2, Arterial: 108 mmHg (ref 83.0–108.0)

## 2016-09-23 LAB — CBC
HCT: 27.1 % — ABNORMAL LOW (ref 36.0–46.0)
HEMATOCRIT: 29 % — AB (ref 36.0–46.0)
Hemoglobin: 9 g/dL — ABNORMAL LOW (ref 12.0–15.0)
Hemoglobin: 8.4 g/dL — ABNORMAL LOW (ref 12.0–15.0)
MCH: 31.3 pg (ref 26.0–34.0)
MCH: 31.6 pg (ref 26.0–34.0)
MCHC: 31 g/dL (ref 30.0–36.0)
MCHC: 31 g/dL (ref 30.0–36.0)
MCV: 100.7 fL — ABNORMAL HIGH (ref 78.0–100.0)
MCV: 101.9 fL — AB (ref 78.0–100.0)
PLATELETS: 61 10*3/uL — AB (ref 150–400)
Platelets: 64 10*3/uL — ABNORMAL LOW (ref 150–400)
RBC: 2.88 MIL/uL — AB (ref 3.87–5.11)
RBC: 2.66 MIL/uL — AB (ref 3.87–5.11)
RDW: 18.6 % — AB (ref 11.5–15.5)
RDW: 18.7 % — ABNORMAL HIGH (ref 11.5–15.5)
WBC: 7.4 10*3/uL (ref 4.0–10.5)
WBC: 7.1 10*3/uL (ref 4.0–10.5)

## 2016-09-23 LAB — BASIC METABOLIC PANEL
ANION GAP: 22 — AB (ref 5–15)
BUN: 69 mg/dL — ABNORMAL HIGH (ref 6–20)
CALCIUM: 8.8 mg/dL — AB (ref 8.9–10.3)
CO2: 23 mmol/L (ref 22–32)
Chloride: 89 mmol/L — ABNORMAL LOW (ref 101–111)
Creatinine, Ser: 13.29 mg/dL — ABNORMAL HIGH (ref 0.44–1.00)
GFR calc Af Amer: 3 mL/min — ABNORMAL LOW (ref >60)
GFR, EST NON AFRICAN AMERICAN: 3 mL/min — AB (ref >60)
Glucose, Bld: 105 mg/dL — ABNORMAL HIGH (ref 65–99)
Potassium: 4.5 mmol/L (ref 3.5–5.1)
Sodium: 134 mmol/L — ABNORMAL LOW (ref 135–145)

## 2016-09-23 LAB — URINALYSIS, ROUTINE W REFLEX MICROSCOPIC
GLUCOSE, UA: NEGATIVE mg/dL
Ketones, ur: 15 mg/dL — AB
Nitrite: POSITIVE — AB
PH: 6.5 (ref 5.0–8.0)
Protein, ur: 100 mg/dL — AB
SPECIFIC GRAVITY, URINE: 1.015 (ref 1.005–1.030)

## 2016-09-23 LAB — GLUCOSE, CAPILLARY
GLUCOSE-CAPILLARY: 111 mg/dL — AB (ref 65–99)
GLUCOSE-CAPILLARY: 105 mg/dL — AB (ref 65–99)
GLUCOSE-CAPILLARY: 100 mg/dL — AB (ref 65–99)
Glucose-Capillary: 104 mg/dL — ABNORMAL HIGH (ref 65–99)
Glucose-Capillary: 108 mg/dL — ABNORMAL HIGH (ref 65–99)
Glucose-Capillary: 101 mg/dL — ABNORMAL HIGH (ref 65–99)

## 2016-09-23 LAB — URINALYSIS, MICROSCOPIC (REFLEX)

## 2016-09-23 LAB — RENAL FUNCTION PANEL
ALBUMIN: 2.8 g/dL — AB (ref 3.5–5.0)
Anion gap: 20 — ABNORMAL HIGH (ref 5–15)
BUN: 76 mg/dL — AB (ref 6–20)
CO2: 24 mmol/L (ref 22–32)
CREATININE: 14.26 mg/dL — AB (ref 0.44–1.00)
Calcium: 8.8 mg/dL — ABNORMAL LOW (ref 8.9–10.3)
Chloride: 93 mmol/L — ABNORMAL LOW (ref 101–111)
GFR calc Af Amer: 3 mL/min — ABNORMAL LOW (ref >60)
GFR, EST NON AFRICAN AMERICAN: 3 mL/min — AB (ref >60)
GLUCOSE: 107 mg/dL — AB (ref 65–99)
PHOSPHORUS: 8.7 mg/dL — AB (ref 2.5–4.6)
POTASSIUM: 4.1 mmol/L (ref 3.5–5.1)
SODIUM: 137 mmol/L (ref 135–145)

## 2016-09-23 LAB — RAPID URINE DRUG SCREEN, HOSP PERFORMED
AMPHETAMINES: NOT DETECTED
Barbiturates: NOT DETECTED
Benzodiazepines: POSITIVE — AB
Cocaine: NOT DETECTED
OPIATES: NOT DETECTED
Tetrahydrocannabinol: NOT DETECTED

## 2016-09-23 LAB — MRSA PCR SCREENING: MRSA BY PCR: NEGATIVE

## 2016-09-23 LAB — MAGNESIUM: Magnesium: 2.8 mg/dL — ABNORMAL HIGH (ref 1.7–2.4)

## 2016-09-23 LAB — GRAM STAIN

## 2016-09-23 LAB — PHOSPHORUS: PHOSPHORUS: 7.2 mg/dL — AB (ref 2.5–4.6)

## 2016-09-23 LAB — TRIGLYCERIDES: TRIGLYCERIDES: 212 mg/dL — AB (ref <150)

## 2016-09-23 LAB — PROCALCITONIN: PROCALCITONIN: 5.12 ng/mL

## 2016-09-23 MED ORDER — LACTULOSE 10 GM/15ML PO SOLN
30.0000 g | Freq: Three times a day (TID) | ORAL | Status: DC
  Administered 2016-09-23 – 2016-09-25 (×5): 30 g via ORAL
  Filled 2016-09-23 (×9): qty 45

## 2016-09-23 MED ORDER — DARBEPOETIN ALFA 60 MCG/0.3ML IJ SOSY
60.0000 ug | PREFILLED_SYRINGE | INTRAMUSCULAR | Status: DC
  Administered 2016-09-24: 60 ug via INTRAVENOUS
  Filled 2016-09-23: qty 0.3

## 2016-09-23 MED ORDER — LIDOCAINE-PRILOCAINE 2.5-2.5 % EX CREA
1.0000 "application " | TOPICAL_CREAM | CUTANEOUS | Status: DC | PRN
  Filled 2016-09-23: qty 5

## 2016-09-23 MED ORDER — VANCOMYCIN HCL 500 MG IV SOLR
500.0000 mg | Freq: Once | INTRAVENOUS | Status: AC
  Administered 2016-09-23: 500 mg via INTRAVENOUS
  Filled 2016-09-23: qty 500

## 2016-09-23 MED ORDER — LIDOCAINE HCL (PF) 1 % IJ SOLN: 5.0000 mL | INTRAMUSCULAR | Status: DC | PRN

## 2016-09-23 MED ORDER — FENTANYL CITRATE (PF) 100 MCG/2ML IJ SOLN
100.0000 ug | INTRAMUSCULAR | Status: DC | PRN
  Administered 2016-09-23 (×2): 100 ug via INTRAVENOUS
  Filled 2016-09-23 (×2): qty 2

## 2016-09-23 MED ORDER — CHLORHEXIDINE GLUCONATE 0.12% ORAL RINSE (MEDLINE KIT)
15.0000 mL | Freq: Two times a day (BID) | OROMUCOSAL | Status: DC
  Administered 2016-09-23 – 2016-09-24 (×4): 15 mL via OROMUCOSAL

## 2016-09-23 MED ORDER — PIPERACILLIN-TAZOBACTAM 3.375 G IVPB
3.3750 g | Freq: Two times a day (BID) | INTRAVENOUS | Status: DC
  Administered 2016-09-23 – 2016-09-26 (×7): 3.375 g via INTRAVENOUS
  Filled 2016-09-23 (×8): qty 50

## 2016-09-23 MED ORDER — HEPARIN SODIUM (PORCINE) 1000 UNIT/ML DIALYSIS
1000.0000 [IU] | INTRAMUSCULAR | Status: DC | PRN
  Filled 2016-09-23: qty 1

## 2016-09-23 MED ORDER — ALTEPLASE 2 MG IJ SOLR
2.0000 mg | Freq: Once | INTRAMUSCULAR | Status: DC | PRN
  Filled 2016-09-23: qty 2

## 2016-09-23 MED ORDER — INSULIN ASPART 100 UNIT/ML ~~LOC~~ SOLN: 2.0000 [IU] | SUBCUTANEOUS | Status: DC

## 2016-09-23 MED ORDER — NEPRO/CARBSTEADY PO LIQD
1000.0000 mL | ORAL | Status: DC
  Administered 2016-09-23: 1000 mL via ORAL
  Filled 2016-09-23 (×3): qty 1000

## 2016-09-23 MED ORDER — PROPOFOL 1000 MG/100ML IV EMUL
0.0000 ug/kg/min | INTRAVENOUS | Status: DC
  Administered 2016-09-23: 25 ug/kg/min via INTRAVENOUS
  Administered 2016-09-23: 35 ug/kg/min via INTRAVENOUS
  Administered 2016-09-23: 30 ug/kg/min via INTRAVENOUS
  Administered 2016-09-24: 20 ug/kg/min via INTRAVENOUS
  Filled 2016-09-23 (×4): qty 100

## 2016-09-23 MED ORDER — PRO-STAT SUGAR FREE PO LIQD
30.0000 mL | Freq: Three times a day (TID) | ORAL | Status: DC
  Administered 2016-09-23 – 2016-09-24 (×3): 30 mL
  Filled 2016-09-23 (×4): qty 30

## 2016-09-23 MED ORDER — INFLUENZA VAC SPLIT QUAD 0.5 ML IM SUSY
0.5000 mL | PREFILLED_SYRINGE | INTRAMUSCULAR | Status: DC
  Filled 2016-09-23: qty 0.5

## 2016-09-23 MED ORDER — VANCOMYCIN HCL IN DEXTROSE 750-5 MG/150ML-% IV SOLN
750.0000 mg | INTRAVENOUS | Status: DC
  Administered 2016-09-24: 750 mg via INTRAVENOUS
  Filled 2016-09-23 (×2): qty 150

## 2016-09-23 MED ORDER — SODIUM CHLORIDE 0.9 % IV SOLN: 100.0000 mL | INTRAVENOUS | Status: DC | PRN

## 2016-09-23 MED ORDER — FENTANYL CITRATE (PF) 100 MCG/2ML IJ SOLN
100.0000 ug | INTRAMUSCULAR | Status: AC | PRN
  Administered 2016-09-23 – 2016-09-24 (×3): 100 ug via INTRAVENOUS
  Filled 2016-09-23 (×3): qty 2

## 2016-09-23 MED ORDER — PANTOPRAZOLE SODIUM 40 MG IV SOLR
40.0000 mg | Freq: Every day | INTRAVENOUS | Status: DC
  Administered 2016-09-23 – 2016-09-24 (×3): 40 mg via INTRAVENOUS
  Filled 2016-09-23 (×3): qty 40

## 2016-09-23 MED ORDER — PENTAFLUOROPROP-TETRAFLUOROETH EX AERO: 1.0000 "application " | INHALATION_SPRAY | CUTANEOUS | Status: DC | PRN

## 2016-09-23 MED ORDER — ORAL CARE MOUTH RINSE
15.0000 mL | Freq: Four times a day (QID) | OROMUCOSAL | Status: DC
  Administered 2016-09-23 – 2016-09-24 (×5): 15 mL via OROMUCOSAL

## 2016-09-23 MED ORDER — HEPARIN SODIUM (PORCINE) 5000 UNIT/ML IJ SOLN
5000.0000 [IU] | Freq: Three times a day (TID) | INTRAMUSCULAR | Status: DC
  Administered 2016-09-23: 5000 [IU] via SUBCUTANEOUS
  Filled 2016-09-23 (×2): qty 1

## 2016-09-23 MED ORDER — SODIUM CHLORIDE 0.9 % IV SOLN: 250.0000 mL | INTRAVENOUS | Status: DC | PRN

## 2016-09-23 MED ORDER — SODIUM CHLORIDE 0.9 % IV SOLN
100.0000 mL | INTRAVENOUS | Status: DC | PRN
  Administered 2016-09-24: 100 mL via INTRAVENOUS

## 2016-09-23 MED ORDER — PNEUMOCOCCAL VAC POLYVALENT 25 MCG/0.5ML IJ INJ
0.5000 mL | INJECTION | INTRAMUSCULAR | Status: DC
  Filled 2016-09-23: qty 0.5

## 2016-09-23 NOTE — Telephone Encounter (Signed)
Pt's boyfriend, [name], called this morning to say that patient became unresponsive while at dialysis yesterday and was transported to [facility] and later transported to ICU at [facility]. He said that she was on the ventilator. He said he wanted [place] to know and would keep [place] informed of L[place]e's condition.

## 2016-09-23 NOTE — Consult Note (Signed)
Reason for Consult: To manage dialysis and dialysis related needs Referring Physician: Dr. Daphene [name] Wanda [name] is an 41 y.o. female.  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 Nelda [name] for management of ESRD and provision of HD .  Had not been to dialysis until yesterday in over a week.  Went to HD and was found to be confused and combative per staff; 2.7L UF.  They called EMS and she was intubated upon arrival to ED .  Had some jerking movements of extremities.    Was hypothermic and tachycardic.     Transferred to [facility] for further management.  Dialyzes at [place] 56.5 kg HD 3 hr 15 min Bath 3K 2.5 Ca Dialyzer RevClr 1190, Heparin 200 U bolus, 500 u q hr. Access R upper arm fistula BFR 350 DFR 600 Epogen 8000 u TIW Hectorol 1.5 mcg IV ITW No iron.  Past Medical History:  Diagnosis Date  . Acute blood loss anemia 02/25/2014   Status post transfusion  . Acute renal failure ([place]) 09/2013   Pre-renal- resolved  . Anasarca 10/10/2013  . Anxiety   . Bipolar disorder (Wanda [name]) 12/04/2013   2007-SEEN IN ED FOR INVOLUNTARY COMMITMENT, UDS POS FOR AMPHETAMINES/OPIATES   . Bleeding esophageal varices (Wanda [name]) 02/28/2014   s/p banding  . C. difficile colitis 04/19/2014  . Chronic hypotension   . Cirrhosis (Wanda [name]) 10/05/13   Liver bx 11/23/13 (delayed initially due to patient refusal). c/w steatohepatitis  . Cirrhosis of liver with ascites (Wanda [name])   . Depression   . ESRD (end stage renal disease) on dialysis (Wanda [name]) 08/2014  . Folate deficiency 09/2013  . Gastroesophageal junction ulcer 09/17/2014  . GERD (gastroesophageal reflux disease)   . Hematemesis/vomiting blood 02/24/2014  . Macrocytosis 02/28/2014  . PNA (pneumonia) 10/13/2013  . SBP (spontaneous bacterial peritonitis) (Wanda [name]) 11/10/2013  . Thrombocytopenia (Wanda [name])    Hypercellular bone  marrow; abundant megakaryocytes per 08/27/2014; s/p bone marrow bx    Past Surgical History:  Procedure Laterality Date  . AV FISTULA PLACEMENT Right 11/16/2014   Procedure: Right arm Creation of arteriovenous fistula;  Surgeon: Angelia Mould, MD;  Location: [place];  Service: Vascular;  Laterality: Right;  . CENTRAL VENOUS CATHETER INSERTION Right   . COLONOSCOPY N/A 12/19/2013   SLF:NO OBVIOUS SOURCE FOR ANEMIA IDETIFIED/ONE COLON POLYP REMOVED/Small internal hemorrhoids  . ESOPHAGEAL BANDING  07/04/2014   Procedure: ESOPHAGEAL BANDING;  Surgeon: Daneil Dolin, MD;  [facility];  Service: Endoscopy;;  . ESOPHAGEAL BANDING N/A 07/24/2014   Procedure: ESOPHAGEAL BANDING (2 bands applied);  Surgeon: Wanda Binder, MD;  Location: [facility];  Service: Endoscopy;  Laterality: N/A;  . ESOPHAGEAL BANDING N/A 07/23/2015   Procedure: ESOPHAGEAL BANDING;  Surgeon: Wanda Binder, MD;  [facility];  Service: Endoscopy;  Laterality: N/A;  . ESOPHAGEAL BANDING N/A 03/04/2016   Procedure: ESOPHAGEAL BANDING;  Surgeon: Wanda Binder, MD;  [facility];  Service: Endoscopy;  Laterality: N/A;  . ESOPHAGEAL BANDING N/A 04/30/2016   Procedure: ESOPHAGEAL BANDING;  Surgeon: Daneil Dolin, MD;  [facility];  Service: Endoscopy;  Laterality: N/A;  . ESOPHAGOGASTRODUODENOSCOPY N/A 11/14/2013   SLF:1 column of very small varices in distal esopahgus/MODERATE PORTAL GASTROPATHY IN PROXIMAL STOMACH/MODERATE erosive gastritis  . ESOPHAGOGASTRODUODENOSCOPY N/A  02/11/2014   Dr. Rourk:Esophageal varices with bleeding stigmata-status post esophageal band ligation therapy. Portal gastropathy  . ESOPHAGOGASTRODUODENOSCOPY N/A 07/04/2014   RMR: Persiting grade 2 esophageal varicies with bleeding stigmata status post band ligation. Significantly congested gastric mucosa iwith changes constistant with protal gastropathy.   . ESOPHAGOGASTRODUODENOSCOPY N/A 09/16/2014   Rehman: Single  short column of varix proximal to GE junction not large enough to be banded. Two amall ulcers at the GEJ felt to be source of GI Bleeding but no active bleeding but no actibe bleeding noted. No therapy rendered. Portal gastropathy NO evidence of peptic ulcer diease or gastric varices.   . ESOPHAGOGASTRODUODENOSCOPY N/A 12/14/2014   SLF: Grade ! esophageal varices. 2. Moderate Portal Gastropathy  . ESOPHAGOGASTRODUODENOSCOPY N/A 03/27/2015   Procedure: ESOPHAGOGASTRODUODENOSCOPY (EGD);  Surgeon: Wanda Binder, MD;  [facility];  Service: Endoscopy;  Laterality: N/A;  1045am - moved to 817 @ 11:30  . ESOPHAGOGASTRODUODENOSCOPY N/A 06/05/2015   Procedure: ESOPHAGOGASTRODUODENOSCOPY (EGD);  Surgeon: Wanda Essex, MD;  Location: [facility] ENDOSCOPY;  Service: Endoscopy;  Laterality: N/A;  . ESOPHAGOGASTRODUODENOSCOPY N/A 07/23/2015   SLF:1. Grade 1 esophageal varices 2. Moderate portal hypertensive gastropathy 3. MILd non-erosive gastritis.   [name] ESOPHAGOGASTRODUODENOSCOPY N/A 03/04/2016   Dr. Oneida Alar: grade 1 varices, surveillance in Jan 2017   . ESOPHAGOGASTRODUODENOSCOPY N/A 05/21/2016   Procedure: ESOPHAGOGASTRODUODENOSCOPY (EGD);  Surgeon: Wanda Mayer, MD;  Location: [facility];  Service: Endoscopy;  Laterality: N/A;  . ESOPHAGOGASTRODUODENOSCOPY (EGD) WITH PROPOFOL N/A 07/24/2014   SLF:  1. 2 columns grade 2-3 varices- 2 Bands applied.  2.  Moderate gastropathy 3. Duodenal Diverticula  . ESOPHAGOGASTRODUODENOSCOPY (EGD) WITH PROPOFOL N/A 10/26/2014   RMR: 2 columns of grade 2 esophageal varices without obvious bleeding stigmata status post band ligation to complet obliteration of remaining varices.   . ESOPHAGOGASTRODUODENOSCOPY (EGD) WITH PROPOFOL N/A 04/30/2016   Procedure: ESOPHAGOGASTRODUODENOSCOPY (EGD) WITH PROPOFOL;  Surgeon: Daneil Dolin, MD;  [facility];  Service: Endoscopy;  Laterality: N/A;  . None    . PARACENTESIS  Feb 2015   1180 fluid, negative fluid analysis.   [name]  PARACENTESIS  10/2013    Family History  Problem Relation Age of Onset  . Heart disease Mother   . Colon cancer Neg Hx   . Liver disease Neg Hx     Social History:  reports that she quit smoking about 7 years ago. Her smoking use included Cigarettes. She has a 5.00 pack-year smoking history. She has never used smokeless tobacco. She reports that she does not drink alcohol or use drugs.  Allergies:  Allergies  Allergen Reactions  . Lasix [Furosemide] Other (See Comments)    "doesn't work"  . Latex Itching    Medications: I have reviewed the patient's current medications.   Results for orders placed or performed during the hospital encounter of 09/22/16 (from the past 48 hour(s))  CBG monitoring, ED     Status: Abnormal   Collection Time: 09/22/16  5:01 PM  Result Value Ref Range   Glucose-Capillary 160 (H) 65 - 99 mg/dL  I-Stat Troponin, ED (not at [facility])     Status: None   Collection Time: 09/22/16  5:11 PM  Result Value Ref Range   Troponin i, poc 0.01 0.00 - 0.08 ng/mL   Comment 3            Comment: Due to the release kinetics of cTnI, a negative result within the first hours of the onset of symptoms does  not rule out myocardial infarction with certainty. If myocardial infarction is still suspected, repeat the test at appropriate intervals.   I-stat chem 8, ed     Status: Abnormal   Collection Time: 09/22/16  5:12 PM  Result Value Ref Range   Sodium 135 135 - 145 mmol/L   Potassium 3.7 3.5 - 5.1 mmol/L   Chloride 96 (L) 101 - 111 mmol/L   BUN 53 (H) 6 - 20 mg/dL   Creatinine, Ser 12.40 (H) 0.44 - 1.00 mg/dL   Glucose, Bld 164 (H) 65 - 99 mg/dL   Calcium, Ion 0.90 (L) 1.15 - 1.40 mmol/L   TCO2 27 0 - 100 mmol/L   Hemoglobin 12.2 12.0 - 15.0 g/dL   HCT 36.0 36.0 - 46.0 %  CBC with Differential     Status: Abnormal   Collection Time: 09/22/16  5:15 PM  Result Value Ref Range   WBC 8.7 4.0 - 10.5 K/uL   RBC 3.44 (L) 3.87 - 5.11 MIL/uL   Hemoglobin 11.1 (L) 12.0 -  15.0 g/dL   HCT 34.1 (L) 36.0 - 46.0 %   MCV 99.1 78.0 - 100.0 fL   MCH 32.3 26.0 - 34.0 pg   MCHC 32.6 30.0 - 36.0 g/dL   RDW 18.5 (H) 11.5 - 15.5 %   Platelets 96 (L) 150 - 400 K/uL    Comment: SPECIMEN CHECKED FOR CLOTS CONSISTENT WITH PREVIOUS RESULT    Neutrophils Relative % 90 %   Neutro Abs 7.8 (H) 1.7 - 7.7 K/uL   Lymphocytes Relative 5 %   Lymphs Abs 0.5 (L) 0.7 - 4.0 K/uL   Monocytes Relative 4 %   Monocytes Absolute 0.3 0.1 - 1.0 K/uL   Eosinophils Relative 1 %   Eosinophils Absolute 0.1 0.0 - 0.7 K/uL   Basophils Relative 0 %   Basophils Absolute 0.0 0.0 - 0.1 K/uL  Comprehensive metabolic panel     Status: Abnormal   Collection Time: 09/22/16  5:15 PM  Result Value Ref Range   Sodium 134 (L) 135 - 145 mmol/L   Potassium 3.7 3.5 - 5.1 mmol/L   Chloride 91 (L) 101 - 111 mmol/L   CO2 23 22 - 32 mmol/L   Glucose, Bld 159 (H) 65 - 99 mg/dL   BUN 62 (H) 6 - 20 mg/dL   Creatinine, Ser 11.14 (H) 0.44 - 1.00 mg/dL   Calcium 9.1 8.9 - 10.3 mg/dL   Total Protein 8.7 (H) 6.5 - 8.1 g/dL   Albumin 4.0 3.5 - 5.0 g/dL   AST 61 (H) 15 - 41 U/L   ALT 32 14 - 54 U/L   Alkaline Phosphatase 127 (H) 38 - 126 U/L   Total Bilirubin 1.6 (H) 0.3 - 1.2 mg/dL   GFR calc non Af Amer 4 (L) >60 mL/min   GFR calc Af Amer 4 (L) >60 mL/min    Comment: (NOTE) The eGFR has been calculated using the CKD EPI equation. This calculation has not been validated in all clinical situations. eGFR's persistently <60 mL/min signify possible Chronic Kidney Disease.    Anion gap 20 (H) 5 - 15  Protime-INR     Status: None   Collection Time: 09/22/16  5:15 PM  Result Value Ref Range   Prothrombin Time 13.7 11.4 - 15.2 seconds   INR 1.04   APTT     Status: None   Collection Time: 09/22/16  5:15 PM  Result Value Ref Range   aPTT 31 24 -  36 seconds  I-Stat CG4 Lactic Acid, ED     Status: None   Collection Time: 09/22/16  5:16 PM  Result Value Ref Range   Lactic Acid, Venous 1.84 0.5 - 1.9 mmol/L   I-stat troponin, ED     Status: None   Collection Time: 09/22/16  5:28 PM  Result Value Ref Range   Troponin i, poc 0.01 0.00 - 0.08 ng/mL   Comment 3            Comment: Due to the release kinetics of cTnI, a negative result within the first hours of the onset of symptoms does not rule out myocardial infarction with certainty. If myocardial infarction is still suspected, repeat the test at appropriate intervals.   I-Stat CG4 Lactic Acid, ED     Status: Abnormal   Collection Time: 09/22/16  5:29 PM  Result Value Ref Range   Lactic Acid, Venous 2.00 (HH) 0.5 - 1.9 mmol/L   Comment NOTIFIED PHYSICIAN   Blood gas, arterial ([facility] ONLY)     Status: Abnormal   Collection Time: 09/22/16  5:32 PM  Result Value Ref Range   FIO2 0.50    Delivery systems VENTILATOR    Mode PRESSURE REGULATED VOLUME CONTROL    VT 380 mL   LHR 16 resp/min   Peep/cpap 5.0 cm H20   pH, Arterial 7.360 7.350 - 7.450   pCO2 arterial 43.5 32.0 - 48.0 mmHg   pO2, Arterial 328 (H) 83.0 - 108.0 mmHg   Bicarbonate 23.6 20.0 - 28.0 mmol/L   Acid-base deficit 0.7 0.0 - 2.0 mmol/L   O2 Saturation 99.5 %   Patient temperature 37.0    Collection site RIGHT RADIAL    Drawn by [id number]    Sample type ARTERIAL DRAW    Allens test (pass/fail) PASS PASS  Blood culture (routine x 2)     Status: None (Preliminary result)   Collection Time: 09/22/16  5:50 PM  Result Value Ref Range   Specimen Description BLOOD LEFT HAND    Special Requests BOTTLES DRAWN AEROBIC AND ANAEROBIC 6CC EACH    Culture NO GROWTH < 24 HOURS    Report Status PENDING   Blood culture (routine x 2)     Status: None (Preliminary result)   Collection Time: 09/22/16  5:56 PM  Result Value Ref Range   Specimen Description BLOOD LEFT WRIST    Special Requests BOTTLES DRAWN AEROBIC ONLY 6CC EACH    Culture NO GROWTH < 24 HOURS    Report Status PENDING   Ammonia     Status: Abnormal   Collection Time: 09/22/16  5:58 PM  Result Value Ref Range    Ammonia 78 (H) 9 - 35 umol/L  Acetaminophen level     Status: Abnormal   Collection Time: 09/22/16  5:58 PM  Result Value Ref Range   Acetaminophen (Tylenol), Serum <10 (L) 10 - 30 ug/mL    Comment:        THERAPEUTIC CONCENTRATIONS VARY SIGNIFICANTLY. A RANGE OF 10-30 ug/mL MAY BE AN EFFECTIVE CONCENTRATION FOR MANY PATIENTS. HOWEVER, SOME ARE BEST TREATED AT CONCENTRATIONS OUTSIDE THIS RANGE. ACETAMINOPHEN CONCENTRATIONS >150 ug/mL AT 4 HOURS AFTER INGESTION AND >50 ug/mL AT 12 HOURS AFTER INGESTION ARE OFTEN ASSOCIATED WITH TOXIC REACTIONS.   Salicylate level     Status: None   Collection Time: 09/22/16  5:58 PM  Result Value Ref Range   Salicylate Lvl <7.7 2.8 - 30.0 mg/dL  Ethanol  Status: None   Collection Time: 09/22/16  5:58 PM  Result Value Ref Range   Alcohol, Ethyl (B) <5 <5 mg/dL    Comment:        LOWEST DETECTABLE LIMIT FOR SERUM ALCOHOL IS 5 mg/dL FOR MEDICAL PURPOSES ONLY   hCG, serum, qualitative     Status: None   Collection Time: 09/22/16  5:58 PM  Result Value Ref Range   Preg, Serum NEGATIVE NEGATIVE  MRSA PCR Screening     Status: None   Collection Time: 09/22/16 11:01 PM  Result Value Ref Range   MRSA by PCR NEGATIVE NEGATIVE    Comment:        The GeneXpert MRSA Assay (FDA approved for NASAL specimens only), is one component of a comprehensive MRSA colonization surveillance program. It is not intended to diagnose MRSA infection nor to guide or monitor treatment for MRSA infections.   Glucose, capillary     Status: Abnormal   Collection Time: 09/22/16 11:09 PM  Result Value Ref Range   Glucose-Capillary 117 (H) 65 - 99 mg/dL   Comment 1 Notify RN   Procalcitonin     Status: None   Collection Time: 09/23/16  2:10 AM  Result Value Ref Range   Procalcitonin 5.12 ng/mL    Comment:        Interpretation: PCT > 2 ng/mL: Systemic infection (sepsis) is likely, unless other causes are known. (NOTE)         ICU PCT Algorithm                Non ICU PCT Algorithm    ----------------------------     ------------------------------         PCT < 0.25 ng/mL                 PCT < 0.1 ng/mL     Stopping of antibiotics            Stopping of antibiotics       strongly encouraged.               strongly encouraged.    ----------------------------     ------------------------------       PCT level decrease by               PCT < 0.25 ng/mL       >= 80% from peak PCT       OR PCT 0.25 - 0.5 ng/mL          Stopping of antibiotics                                             encouraged.     Stopping of antibiotics           encouraged.    ----------------------------     ------------------------------       PCT level decrease by              PCT >= 0.25 ng/mL       < 80% from peak PCT        AND PCT >= 0.5 ng/mL            Continuing antibiotics  encouraged.       Continuing antibiotics            encouraged.    ----------------------------     ------------------------------     PCT level increase compared          PCT > 0.5 ng/mL         with peak PCT AND          PCT >= 0.5 ng/mL             Escalation of antibiotics                                          strongly encouraged.      Escalation of antibiotics        strongly encouraged.   CBC     Status: Abnormal   Collection Time: 09/23/16  2:10 AM  Result Value Ref Range   WBC 7.4 4.0 - 10.5 K/uL   RBC 2.88 (L) 3.87 - 5.11 MIL/uL   Hemoglobin 9.0 (L) 12.0 - 15.0 g/dL   HCT 29.0 (L) 36.0 - 46.0 %   MCV 100.7 (H) 78.0 - 100.0 fL   MCH 31.3 26.0 - 34.0 pg   MCHC 31.0 30.0 - 36.0 g/dL   RDW 18.6 (H) 11.5 - 15.5 %   Platelets 64 (L) 150 - 400 K/uL    Comment: CONSISTENT WITH PREVIOUS RESULT  Basic metabolic panel     Status: Abnormal   Collection Time: 09/23/16  2:10 AM  Result Value Ref Range   Sodium 134 (L) 135 - 145 mmol/L   Potassium 4.5 3.5 - 5.1 mmol/L   Chloride 89 (L) 101 - 111 mmol/L   CO2 23 22 - 32 mmol/L    Glucose, Bld 105 (H) 65 - 99 mg/dL   BUN 69 (H) 6 - 20 mg/dL   Creatinine, Ser 13.29 (H) 0.44 - 1.00 mg/dL   Calcium 8.8 (L) 8.9 - 10.3 mg/dL   GFR calc non Af Amer 3 (L) >60 mL/min   GFR calc Af Amer 3 (L) >60 mL/min    Comment: (NOTE) The eGFR has been calculated using the CKD EPI equation. This calculation has not been validated in all clinical situations. eGFR's persistently <60 mL/min signify possible Chronic Kidney Disease.    Anion gap 22 (H) 5 - 15    Comment: RESULT CHECKED  Magnesium     Status: Abnormal   Collection Time: 09/23/16  2:10 AM  Result Value Ref Range   Magnesium 2.8 (H) 1.7 - 2.4 mg/dL  Phosphorus     Status: Abnormal   Collection Time: 09/23/16  2:10 AM  Result Value Ref Range   Phosphorus 7.2 (H) 2.5 - 4.6 mg/dL  Triglycerides     Status: Abnormal   Collection Time: 09/23/16  2:10 AM  Result Value Ref Range   Triglycerides 212 (H) <150 mg/dL  Glucose, capillary     Status: Abnormal   Collection Time: 09/23/16  3:46 AM  Result Value Ref Range   Glucose-Capillary 111 (H) 65 - 99 mg/dL   Comment 1 Notify RN   Blood gas, arterial     Status: Abnormal   Collection Time: 09/23/16  6:00 AM  Result Value Ref Range   FIO2 40.00    Delivery systems VENTILATOR    Mode PRESSURE REGULATED VOLUME CONTROL    VT 380 mL  LHR 16 resp/min   Peep/cpap 5.0 cm H20   pH, Arterial 7.485 (H) 7.350 - 7.450   pCO2 arterial 34.2 32.0 - 48.0 mmHg   pO2, Arterial 108 83.0 - 108.0 mmHg   Bicarbonate 25.4 20.0 - 28.0 mmol/L   Acid-Base Excess 2.2 (H) 0.0 - 2.0 mmol/L   O2 Saturation 98.0 %   Patient temperature 98.6    Collection site LEFT RADIAL    Drawn by [phone]    Sample type ARTERIAL DRAW    Allens test (pass/fail) PASS PASS  Culture, respiratory (tracheal aspirate)     Status: None (Preliminary result)   Collection Time: 09/23/16  6:46 AM  Result Value Ref Range   Specimen Description TRACHEAL ASPIRATE    Special Requests NONE    Gram Stain      RARE WBC  PRESENT,BOTH PMN AND MONONUCLEAR FEW GRAM NEGATIVE COCCOBACILLI RARE GRAM POSITIVE COCCI IN PAIRS    Culture PENDING    Report Status PENDING   Urinalysis, Routine w reflex microscopic     Status: Abnormal   Collection Time: 09/23/16  8:05 AM  Result Value Ref Range   Color, Urine RED (A) YELLOW    Comment: BIOCHEMICALS MAY BE AFFECTED BY COLOR   APPearance CLOUDY (A) CLEAR   Specific Gravity, Urine 1.015 1.005 - 1.030   pH 6.5 5.0 - 8.0   Glucose, UA NEGATIVE NEGATIVE mg/dL   Hgb urine dipstick LARGE (A) NEGATIVE   Bilirubin Urine MODERATE (A) NEGATIVE   Ketones, ur 15 (A) NEGATIVE mg/dL   Protein, ur 100 (A) NEGATIVE mg/dL   Nitrite POSITIVE (A) NEGATIVE   Leukocytes, UA LARGE (A) NEGATIVE  Urinalysis, Microscopic (reflex)     Status: Abnormal   Collection Time: 09/23/16  8:05 AM  Result Value Ref Range   RBC / HPF TOO NUMEROUS TO COUNT 0 - 5 RBC/hpf   WBC, UA 6-30 0 - 5 WBC/hpf   Bacteria, UA MANY (A) NONE SEEN   Squamous Epithelial / LPF 0-5 (A) NONE SEEN   Urine-Other MICROSCOPIC EXAM PERFORMED ON UNCONCENTRATED URINE     Comment: LESS THAN 10 mL OF URINE SUBMITTED  Glucose, capillary     Status: Abnormal   Collection Time: 09/23/16  8:15 AM  Result Value Ref Range   Glucose-Capillary 104 (H) 65 - 99 mg/dL   Comment 1 Notify RN    Comment 2 Document in Chart   Rapid urine drug screen (hospital performed)     Status: Abnormal   Collection Time: 09/23/16 10:15 AM  Result Value Ref Range   Opiates NONE DETECTED NONE DETECTED   Cocaine NONE DETECTED NONE DETECTED   Benzodiazepines POSITIVE (A) NONE DETECTED   Amphetamines NONE DETECTED NONE DETECTED   Tetrahydrocannabinol NONE DETECTED NONE DETECTED   Barbiturates NONE DETECTED NONE DETECTED    Comment:        DRUG SCREEN FOR MEDICAL PURPOSES ONLY.  IF CONFIRMATION IS NEEDED FOR ANY PURPOSE, NOTIFY LAB WITHIN 5 DAYS.        LOWEST DETECTABLE LIMITS FOR URINE DRUG SCREEN Drug Class       Cutoff  (ng/mL) Amphetamine      1000 Barbiturate      200 Benzodiazepine   888 Tricyclics       280 Opiates          300 Cocaine          300 THC              50  Glucose, capillary     Status: Abnormal   Collection Time: 09/23/16 11:38 AM  Result Value Ref Range   Glucose-Capillary 105 (H) 65 - 99 mg/dL   Comment 1 Notify RN    Comment 2 Document in Chart     Ct Head Wo Contrast  Result Date: 09/22/2016 CLINICAL DATA:  Altered mental status/confusion EXAM: CT HEAD WITHOUT CONTRAST TECHNIQUE: Contiguous axial images were obtained from the base of the skull through the vertex without intravenous contrast. COMPARISON:  November 05, 2013 FINDINGS: Brain: The ventricles are normal in size and configuration. There is no intracranial mass, hemorrhage, extra-axial fluid collection, or midline shift. Gray-white compartments appear normal. No acute infarct evident. Vascular: No hyperdense vessel. There is calcification in each carotid siphon region. Skull:  Bony calvarium appears intact. Sinuses/Orbits: Visualized paranasal sinuses are clear. Visualized orbits appear symmetric bilaterally. Other:  Visualized mastoid air cells are clear. IMPRESSION: Calcification in each carotid siphon region. No intracranial mass, hemorrhage, or extra-axial fluid collection. Gray-white compartments appear normal. Electronically Signed   By: Lowella Grip III M.D.   On: 09/22/2016 18:51   Dg Chest Port 1 View  Result Date: 09/23/2016 CLINICAL DATA:  Hypoxia EXAM: PORTABLE CHEST 1 VIEW COMPARISON:  September 22, 2016 FINDINGS: Endotracheal tube tip is 1.3 cm above the carina. Nasogastric tube tip and side port below the diaphragm. No pneumothorax. There is consolidation in the left lower lobe, increased, with new small left pleural effusion. Right lung is clear. Heart is borderline enlarged with pulmonary vascularity within normal limits. There is atherosclerotic calcification in the aorta. No adenopathy. No bone lesions.  IMPRESSION: Endotracheal tube tip near the carina. Advise withdrawing endotracheal tube approximately 2 cm. Left lower lobe consolidation with small left pleural effusion, increased from 1 day prior. Right lung remains clear. Stable cardiac silhouette. There is aortic atherosclerosis. Electronically Signed   By: Lowella Grip III M.D.   On: 09/23/2016 13:09   Dg Chest Portable 1 View  Result Date: 09/22/2016 CLINICAL DATA:  Endotracheal tube placement EXAM: PORTABLE CHEST 1 VIEW COMPARISON:  Chest radiograph 09/19/2016 FINDINGS: Endotracheal tube tip is just below the level of the clavicular heads. Enteric tube courses beyond the diaphragm. There is shallow lung inflation with a small left pleural effusion and associated basilar atelectasis. Medial right basilar opacities are also favored to indicate atelectasis. No pneumothorax. No pulmonary edema. IMPRESSION: 1. Endotracheal tube tip in radiographically appropriate position. 2. Small left pleural effusion and bibasilar atelectasis. 3. Suspect right basilar atelectasis, though developing infection may have the same appearance. Electronically Signed   By: Ulyses Jarred M.D.   On: 09/22/2016 18:01    ROS: unable to obtain 2/2 mental status Blood pressure 101/61, pulse 91, temperature 97.5 F (36.4 C), resp. rate (!) 21, height 5' 1"  (1.549 m), weight 65.5 kg (144 lb 6.4 oz), last menstrual period 09/21/2013, SpO2 99 %. GEN: chronically ill appearing HEENT: PERRL, sclerae muddy NECK Supple, no overt JVD but some neck fullness PULM: expiratory rhonchi bilaterally R > L CV RRR soft systolic murmur ABD + distended, + fluid wave, easily reducible umbilical hernia.  Hypoactive bowel sounds EXT SCDs in place, + pitting edema to the sacrum NEURO intubated and sedated  Assessment/Plan: 1  Acute encephalopathy: possibly due to HE + sepsis.  CT head without acute findings.  Per primary.  2. Sepsis: sputum culture with + Gm neg coccobacilli and rare GPCs  in pairs, UA dirty, culture pending.  Vanc/ Zosyn. Per primary 3. Acute  hypoxic respiratory failure: intubated.  Per CCM 4  ESRD: Tuesday/ Thursday/ Friday [facility]  No emergent indication.  Next HD tomorrow 2/15.  Hold heparin as plts 64. 5  ESLD: gets regular paracenteses. Could consider at least diagnostic tap if SBP suspected again.   6  Chronic hypotension: on midodrine 10 mg before HD ; will prescribe when able 7. Anemia of ESRD: Epo 8000 u TIW; Hgb 9.0 2/14, Aranesp 60 mcg q week 8 Metabolic Bone Disease: holding binders until taking PO 9 Nutrition: albumin 4.0   Tangi Shroff 09/23/2016, 2:52 PM

## 2016-09-23 NOTE — Progress Notes (Signed)
Initial Nutrition Assessment  DOCUMENTATION CODES:   Not applicable  INTERVENTION:    Nepro at 20 ml/h (480 ml per day)  Pro-stat 30 ml TID  Provides 1164 kcal, 84 gm protein, 349 ml free water daily  Total intake with TF and Propofol will be 1510 kcal (102% of estimated needs)  NUTRITION DIAGNOSIS:   Inadequate oral intake related to inability to eat as evidenced by NPO status.  GOAL:   Patient will meet greater than or equal to 90% of their needs  MONITOR:   Vent status, TF tolerance, I & O's, Skin, Labs  REASON FOR ASSESSMENT:   Consult Enteral/tube feeding initiation and management  ASSESSMENT:   41 year old female with complicated PMH significant for ESRD on HD (T, TH, SAT), Cirrhosis with ascites and h/o varices, and chronic hypotension. She has reportedly not been to dialysis for over one week because she was feeling "unwell and unmotivated". She did go to HD 2/13, but was very confused and combative while there. She had 2.7 L dialyzed and subsequently became unresponsive prompting EMS to be called.  On arrival to ED she was noted to have GCS 3 and was intubated for airway protection.   Received MD Consult for TF initiation and management. Unable to complete Nutrition-Focused physical exam at this time.  Next HD treatment planned 2/15. Patient is currently intubated on ventilator support MV: 7.8 L/min Temp (24hrs), Avg:97.3 F (36.3 C), Min:95.5 F (35.3 C), Max:100.2 F (37.9 C)  Propofol: 13.1 ml/hr providing 346 kcal from lipid (per day) Labs reviewed: sodium 134, phosphorus 7.2, magnesium 2.8 Medications reviewed and include lactulose, propofol.   Diet Order:  Diet NPO time specified  Skin:  Reviewed, no issues  Last BM:  unknown  Height:   Ht Readings from Last 1 Encounters:  09/22/16 5\' 1"  (1.549 m)    Weight:   Wt Readings from Last 1 Encounters:  09/23/16 144 lb 6.4 oz (65.5 kg)   EDW: 56.5 kg  Ideal Body Weight:  47.7 kg  BMI:   Body mass index is 27.28 kg/m.  Estimated Nutritional Needs:   Kcal:  1477  Protein:  80-90 gm  Fluid:  1.2 L  EDUCATION NEEDS:   No education needs identified at this time  Molli Barrows, [place], [place], [place] Pager [phone] After Hours Pager [phone]

## 2016-09-23 NOTE — Procedures (Signed)
ELECTROENCEPHALOGRAM REPORT  Date of Study: 09/23/2016  Patient's Name: Wanda [name] MRN: [id number] Date of Birth: 06/15/1976  Referring Provider: Georgann Housekeeper, NP  Clinical History: This is a 41 year old woman with unresponsiveness with jerking movements.  Medications: propofol (DIPRIVAN) 1000 MG/100ML infusion  pantoprazole (PROTONIX) injection 40 mg  piperacillin-tazobactam (ZOSYN) IVPB 3.375 g  vancomycin (VANCOCIN) IVPB 750 mg/150 ml premix   Technical Summary: A multichannel digital EEG recording measured by the international 10-20 system with electrodes applied with paste and impedances below 5000 ohms performed as portable with EKG monitoring in an intubated and sedated patient.  Hyperventilation and photic stimulation were not performed.  The digital EEG was referentially recorded, reformatted, and digitally filtered in a variety of bipolar and referential montages for optimal display.   Description: The patient is intubated and sedated during the recording. Propofol is turned off 10 minutes into the recording. There is no clear posterior dominant rhythm. The background is disorganized with a mixture of beta activity and theta and delta slowing, with occasional triphasic waves seen. Normal sleep architecture is not seen. Hyperventilation and photic stimulation were not performed.  There were no epileptiform discharges or electrographic seizures seen.    EKG lead showed sinus tachycardia.  Impression: This sedated EEG is abnormal due to moderate diffuse slowing of the background.  Clinical Correlation of the above findings indicates diffuse cerebral dysfunction that is non-specific in etiology and can be seen with hypoxic/ischemic injury, toxic/metabolic encephalopathies,  or medication effect from Propolol.  The absence of epileptiform discharges does not rule out a clinical diagnosis of epilepsy. If further clinical questions remain, repeat EEG off sedation may be helpful.  Clinical correlation is advised.   Ellouise Newer, M.D.

## 2016-09-23 NOTE — Progress Notes (Signed)
Pharmacy Antibiotic Note  Wanda [name] is a 41 y.o. female admitted on 09/22/2016 with sepsis.  Pharmacy has been consulted for Vancomycin/Zosyn dosing. Here from [facility] with altered mental status requiring intubation. ESRD on HD Tues/Thurs/Sat. Missed HD x 1 week. WBC WNL.  Plan: -Vancomycin 1500 mg IV x 1, then give 750 mg IV qHD Tues/Thurs/Sat -Zosyn 3.375G IV q12h to be infused over 4 hours -Trend WBC, temp, HD schedule -Drug levels as indicated    Height: 5\' 1"  (154.9 cm) Weight: 160 lb (72.6 kg) IBW/kg (Calculated) : 47.8  Temp (24hrs), Avg:96.8 F (36 C), Min:95.5 F (35.3 C), Max:99 F (37.2 C)   Recent Labs Lab 09/19/16 1417 09/22/16 1712 09/22/16 1715 09/22/16 1716 09/22/16 1729  WBC 5.2  --  8.7  --   --   CREATININE 18.82* 12.40* 11.14*  --   --   LATICACIDVEN  --   --   --  1.84 2.00*    Estimated Creatinine Clearance: 6 mL/min (by C-G formula based on SCr of 11.14 mg/dL (H)).    Allergies  Allergen Reactions  . Lasix [Furosemide] Other (See Comments)    "doesn't work"  . Latex Itching   Narda Bonds 09/23/2016 12:55 AM

## 2016-09-23 NOTE — Telephone Encounter (Signed)
REVIEWED-NO ADDITIONAL RECOMMENDATIONS. 

## 2016-09-23 NOTE — Progress Notes (Signed)
EEG Completed; Results Pending  

## 2016-09-23 NOTE — Telephone Encounter (Signed)
Noted  

## 2016-09-23 NOTE — Progress Notes (Signed)
Pt arrived from [facility] via carelink at 23:00. No complications during transport. Propofol shut off per NP Hoffman, patient is currently having tremor like activity, responds to voice. NP made aware

## 2016-09-23 NOTE — Procedures (Signed)
Paracentesis Procedure Note  Done emergently without consent due to respiratory compromise in a patient with no family.  Patient sedated and positioned.  Tested via U/S and good pocket/area was localized.  Area cleaned and drape placed.  Area numbed with 1% unbuffered lidocaine.  Skin incision done then catheter over a needle inserted until fluid is obtained.  Angelica Ran held and catheter advanced.  3.6 liters removed manually until stopped.  Fluid appeared cloudy.  Fluid sent for cell count, differential, culture and albumen level.  Rush Farmer, M.D. [facility] Pulmonary/Critical Care Medicine. Pager: [phone]. After hours pager: [phone].[name]

## 2016-09-23 NOTE — Progress Notes (Signed)
PULMONARY / CRITICAL CARE MEDICINE   Name: Wanda [name] MRN: [id number] DOB: 04/13/1976    ADMISSION DATE:  09/22/2016 CONSULTATION DATE:  2/13  REFERRING MD:  Dr. 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 She has reportedly not been to dialysis for over one week because she was feeling "unwell and unmotivated". She did go to HD 2/13, but was very confused and combative while there. She had 2.7 L dialyzed and subsequently became unresponsive prompting EMS to be called.  On arrival to ED she was noted to have GCS 3 and was intubated for airway protection. She had some clonus of lower extremities raising concern for hepatic encephalopathy, also some clonic jerking of upper extremities with painful stimuli. Exam also demonstrated hypothermia, tachycardia, and normotension. CT head and CXR were non-acute. Unable to collect urine for UA. She was started on broad spectrum antibiotics and was transferred to [facility] for further evaluation.   SUBJECTIVE:  No events overnight, encephalopathic  VITAL SIGNS: BP (!) 102/57   Pulse 97   Temp 97.7 F (36.5 C) (Core (Comment)) Comment (Src): foley  Resp (!) 26   Ht 5\' 1"  (1.549 m)   Wt 65.5 kg (144 lb 6.4 oz)   LMP 09/21/2013   SpO2 100%   BMI 27.28 kg/m   HEMODYNAMICS:    VENTILATOR SETTINGS: Vent Mode: PRVC FiO2 (%):  [40 %-50 %] 40 % Set Rate:  [16 bmp-18 bmp] 16 bmp Vt Set:  [380 mL-420 mL] 380 mL PEEP:  [5 cmH20] 5 cmH20 Plateau Pressure:  [11 cmH20-20 cmH20] 20 cmH20  INTAKE / OUTPUT: I/O last 3 completed shifts: In: 463.2 [I.V.:93.2; Other:20; IV Piggyback:350] Out: 30 [Urine:30]  PHYSICAL EXAMINATION: General:  Female of normal body habitus resting in bed on vent Neuro:  Sedated on vent. With propofol off she has full body intermittent  tremors. Does not resemble seizure.  HEENT:  [place]/AT, PERRL, no JVD Cardiovascular:  Tachy, regular, no MRG. +3 pitting BLE edema Lungs:  Clear Abdomen:  Soft, non-distended Musculoskeletal:  No acute deformity.  Skin:  Grossly intact  LABS:  BMET  Recent Labs Lab 09/19/16 1417 09/22/16 1712 09/22/16 1715 09/23/16 0210  NA 136 135 134* 134*  K 4.5 3.7 3.7 4.5  CL 93* 96* 91* 89*  CO2 21*  --  23 23  BUN 129* 53* 62* 69*  CREATININE 18.82* 12.40* 11.14* 13.29*  GLUCOSE 133* 164* 159* 105*    Electrolytes  Recent Labs Lab 09/19/16 1417 09/22/16 1715 09/23/16 0210  CALCIUM 9.5 9.1 8.8*  MG  --   --  2.8*  PHOS  --   --  7.2*    CBC  Recent Labs Lab 09/19/16 1417 09/22/16 1712 09/22/16 1715 09/23/16 0210  WBC 5.2  --  8.7 7.4  HGB 10.1* 12.2 11.1* 9.0*  HCT 31.5* 36.0 34.1* 29.0*  PLT 75*  --  96* 64*    Coag's  Recent Labs Lab 09/22/16 1715  APTT 31  INR 1.04    Sepsis Markers  Recent Labs Lab 09/22/16 1716 09/22/16 1729 09/23/16 0210  LATICACIDVEN 1.84 2.00*  --   PROCALCITON  --   --  5.12    ABG  Recent Labs Lab 09/22/16 1732 09/23/16 0600  PHART 7.360 7.485*  PCO2ART 43.5 34.2  PO2ART 328* 108    Liver Enzymes  Recent Labs Lab 09/19/16 1417 09/22/16 1715  AST 74* 61*  ALT 34 32  ALKPHOS 148* 127*  BILITOT 1.6* 1.6*  ALBUMIN 3.8 4.0    Cardiac Enzymes No results for input(s): TROPONINI, PROBNP in the last 168 hours.  Glucose  Recent Labs Lab 09/22/16 1701 09/22/16 2309 09/23/16 0346 09/23/16 0815 09/23/16 1138  GLUCAP 160* 117* 111* 104* 105*    Imaging Ct Head Wo Contrast  Result Date: 09/22/2016 CLINICAL DATA:  Altered mental status/confusion EXAM: CT HEAD WITHOUT CONTRAST TECHNIQUE: Contiguous axial images were obtained from the base of the skull through the vertex without intravenous contrast. COMPARISON:  November 05, 2013 FINDINGS: Brain: The ventricles are normal in size and configuration. There is no  intracranial mass, hemorrhage, extra-axial fluid collection, or midline shift. Gray-white compartments appear normal. No acute infarct evident. Vascular: No hyperdense vessel. There is calcification in each carotid siphon region. Skull:  Bony calvarium appears intact. Sinuses/Orbits: Visualized paranasal sinuses are clear. Visualized orbits appear symmetric bilaterally. Other:  Visualized mastoid air cells are clear. IMPRESSION: Calcification in each carotid siphon region. No intracranial mass, hemorrhage, or extra-axial fluid collection. Gray-white compartments appear normal. Electronically Signed   By: Lowella Grip III M.D.   On: 09/22/2016 18:51   Dg Chest Portable 1 View  Result Date: 09/22/2016 CLINICAL DATA:  Endotracheal tube placement EXAM: PORTABLE CHEST 1 VIEW COMPARISON:  Chest radiograph 09/19/2016 FINDINGS: Endotracheal tube tip is just below the level of the clavicular heads. Enteric tube courses beyond the diaphragm. There is shallow lung inflation with a small left pleural effusion and associated basilar atelectasis. Medial right basilar opacities are also favored to indicate atelectasis. No pneumothorax. No pulmonary edema. IMPRESSION: 1. Endotracheal tube tip in radiographically appropriate position. 2. Small left pleural effusion and bibasilar atelectasis. 3. Suspect right basilar atelectasis, though developing infection may have the same appearance. Electronically Signed   By: Ulyses Jarred M.D.   On: 09/22/2016 18:01     STUDIES:  CT head 2/13 > Calcification in each carotid siphon region. No intracranial mass, hemorrhage, or extra-axial fluid collection. Gray-white compartments appear normal.  CULTURES: Blood 2/13 > Urine 2/13 > Tracheal asp 2/13 > Flu PCR 2/13 > RVP 2/13 >  ANTIBIOTICS: Zosyn 2/13 > Vancomycin  2/13 >  SIGNIFICANT EVENTS: 2/13 admit for AMS, on vent  LINES/TUBES: ETT 2/13 >   ASSESSMENT / PLAN:  PULMONARY A: Inability to protect airway due  to encephalopathy  P:   Full vent support, hold weaning for now Decrease RR to 12 ABG in AM CXR in AM VAP prevention per protocol Daily SBT and WUA  CARDIOVASCULAR A:  Chronic hypotension  P:  Tele monitoring D/C heparin for thrombocytopenia  RENAL A:   ESRD on HD (tues, thurs, sat)  P:   Consult nephrology Follow BMP  GASTROINTESTINAL A:   Hepatic cirrhosis Ascites H/o Varices  P:   NPO Protonix Para today TF per nutrition  HEMATOLOGIC A:   Thrombocytopenia (chornic) Anemia (chronic)  P:  Follow CBC  INFECTIOUS A:   SIRS/Sepsis - source unclear, consider SBP  P:   ABX as above Paracentesis for culture and therapeutic today  ENDOCRINE A:   Hyperglycemia  P:   CBG monitoring and SSI  NEUROLOGIC A:   Acute metabolic vs hepatic encephalopathy (more likely) Tremors vs ? Seizures, doubt P:   RASS goal: -1 to -2 Lactulose EEG in AM to r/o seizures, done and will  f/u Propofol infusion  FAMILY  - Updates: no family present  - Inter-disciplinary family meet or Palliative Care meeting due by:  2/19  The patient is critically ill with multiple organ systems failure and requires high complexity decision making for assessment and support, frequent evaluation and titration of therapies, application of advanced monitoring technologies and extensive interpretation of multiple databases.   Critical Care Time devoted to patient care services described in this note is  35  Minutes. This time reflects time of care of this signee Dr Jennet Maduro. This critical care time does not reflect procedure time, or teaching time or supervisory time of PA/NP/Med student/Med Resident etc but could involve care discussion time.  Rush Farmer, M.D. [facility] Pulmonary/Critical Care Medicine. Pager: [phone]. After hours pager: [phone].  09/23/2016 12:00 PM

## 2016-09-24 ENCOUNTER — Inpatient Hospital Stay (HOSPITAL_COMMUNITY): Payer: Medicare Other

## 2016-09-24 DIAGNOSIS — J9601 Acute respiratory failure with hypoxia: Secondary | ICD-10-CM

## 2016-09-24 LAB — CBC
HEMATOCRIT: 29.6 % — AB (ref 36.0–46.0)
HEMOGLOBIN: 9.1 g/dL — AB (ref 12.0–15.0)
MCH: 31.4 pg (ref 26.0–34.0)
MCHC: 30.7 g/dL (ref 30.0–36.0)
MCV: 102.1 fL — ABNORMAL HIGH (ref 78.0–100.0)
Platelets: 61 10*3/uL — ABNORMAL LOW (ref 150–400)
RBC: 2.9 MIL/uL — ABNORMAL LOW (ref 3.87–5.11)
RDW: 18.6 % — ABNORMAL HIGH (ref 11.5–15.5)
WBC: 7.3 10*3/uL (ref 4.0–10.5)

## 2016-09-24 LAB — URINE CULTURE: CULTURE: NO GROWTH

## 2016-09-24 LAB — GLUCOSE, CAPILLARY
GLUCOSE-CAPILLARY: 100 mg/dL — AB (ref 65–99)
GLUCOSE-CAPILLARY: 100 mg/dL — AB (ref 65–99)
GLUCOSE-CAPILLARY: 108 mg/dL — AB (ref 65–99)
Glucose-Capillary: 112 mg/dL — ABNORMAL HIGH (ref 65–99)
Glucose-Capillary: 119 mg/dL — ABNORMAL HIGH (ref 65–99)
Glucose-Capillary: 132 mg/dL — ABNORMAL HIGH (ref 65–99)

## 2016-09-24 LAB — BASIC METABOLIC PANEL
Anion gap: 18 — ABNORMAL HIGH (ref 5–15)
BUN: 81 mg/dL — AB (ref 6–20)
CO2: 23 mmol/L (ref 22–32)
CREATININE: 14.99 mg/dL — AB (ref 0.44–1.00)
Calcium: 8.6 mg/dL — ABNORMAL LOW (ref 8.9–10.3)
Chloride: 95 mmol/L — ABNORMAL LOW (ref 101–111)
GFR calc Af Amer: 3 mL/min — ABNORMAL LOW (ref >60)
GFR calc non Af Amer: 3 mL/min — ABNORMAL LOW (ref >60)
Glucose, Bld: 109 mg/dL — ABNORMAL HIGH (ref 65–99)
Potassium: 4.1 mmol/L (ref 3.5–5.1)
SODIUM: 136 mmol/L (ref 135–145)

## 2016-09-24 LAB — PHOSPHORUS: PHOSPHORUS: 9.7 mg/dL — AB (ref 2.5–4.6)

## 2016-09-24 LAB — PATHOLOGIST SMEAR REVIEW: Path Review: NEGATIVE

## 2016-09-24 LAB — MAGNESIUM: Magnesium: 3.1 mg/dL — ABNORMAL HIGH (ref 1.7–2.4)

## 2016-09-24 MED ORDER — VANCOMYCIN HCL IN DEXTROSE 750-5 MG/150ML-% IV SOLN
INTRAVENOUS | Status: AC
  Filled 2016-09-24: qty 150

## 2016-09-24 MED ORDER — ALBUMIN HUMAN 25 % IV SOLN
12.5000 g | Freq: Once | INTRAVENOUS | Status: AC
  Administered 2016-09-24: 12.5 g via INTRAVENOUS
  Filled 2016-09-24: qty 50

## 2016-09-24 MED ORDER — FENTANYL CITRATE (PF) 100 MCG/2ML IJ SOLN: 100.0000 ug | INTRAMUSCULAR | Status: DC | PRN

## 2016-09-24 MED ORDER — MIDODRINE HCL 5 MG PO TABS
10.0000 mg | ORAL_TABLET | ORAL | Status: DC
  Administered 2016-09-24: 10 mg
  Filled 2016-09-24: qty 2

## 2016-09-24 MED ORDER — SODIUM CHLORIDE 0.9 % IV BOLUS (SEPSIS)
500.0000 mL | Freq: Once | INTRAVENOUS | Status: AC
  Administered 2016-09-24: 500 mL via INTRAVENOUS

## 2016-09-24 MED ORDER — ORAL CARE MOUTH RINSE
15.0000 mL | Freq: Two times a day (BID) | OROMUCOSAL | Status: DC
  Administered 2016-09-24 – 2016-09-25 (×2): 15 mL via OROMUCOSAL

## 2016-09-24 MED ORDER — DARBEPOETIN ALFA 60 MCG/0.3ML IJ SOSY
PREFILLED_SYRINGE | INTRAMUSCULAR | Status: AC
  Filled 2016-09-24: qty 0.3

## 2016-09-24 NOTE — Progress Notes (Signed)
eLink Physician-Brief Progress Note Patient Name: Wanda [name] DOB: [birth year] MRN: [id number]   Date of Service  09/24/2016  HPI/Events of Note  Hypotension - BP = 81/30. Hx of ESRD and Cirrhosis. Patient on Midodrine which suggests that her BP chronically runs low.   eICU Interventions  Will order: 1. Albumin 25% 12.5 gm IV now. 2. Bolus with 0.9 NaCl 500 mL IV over 30 minutes now.      Intervention Category Major Interventions: Hypertension - evaluation and management  Sommer,Steven Eugene 09/24/2016, 10:33 PM

## 2016-09-24 NOTE — Procedures (Signed)
Extubation Procedure Note  Patient Details:   Name: Wanda [name] DOB: 07/01/1976 MRN: [id number]   Airway Documentation:  Airway 7 mm (Active)  Secured at (cm) 22 cm 09/24/2016 12:43 PM  Measured From Lips 09/24/2016 12:43 PM  Secured Location Left 09/24/2016 12:43 PM  Secured By [employer] 09/24/2016 12:43 PM  Tube Holder Repositioned Yes 09/24/2016 12:43 PM  Cuff Pressure (cm H2O) 28 cm H2O 09/24/2016  8:26 AM  Site Condition Dry 09/24/2016 12:43 PM    Evaluation  O2 sats: stable throughout Complications: No apparent complications Patient did tolerate procedure well. Bilateral Breath Sounds: Clear, Diminished   Yes  Extubated patient to 2L nasal canula. Patient oriented to time and place.  Wanda [name] 09/24/2016, 2:40 PM

## 2016-09-24 NOTE — Progress Notes (Signed)
ADMISSION DATE:  09/22/2016 CONSULTATION DATE:  2/13  REFERRING MD:  Dr. 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 She has reportedly not been to dialysis for over one week because she was feeling "unwell and unmotivated". She did go to HD 2/13, but was very confused and combative while there. She had 2.7 L dialyzed and subsequently became unresponsive prompting EMS to be called.  On arrival to ED she was noted to have GCS 3 and was intubated for airway protection. She had some clonus of lower extremities raising concern for hepatic encephalopathy, also some clonic jerking of upper extremities with painful stimuli. Exam also demonstrated hypothermia, tachycardia, and normotension. CT head and CXR were non-acute. Unable to collect urine for UA. She was started on broad spectrum antibiotics and was transferred to [facility] for further evaluation.   SUBJECTIVE:  RN reports the pt's BP has been soft overnight. She decreased the propofol this am, and the pt was responsive and following commands, she moved UP, but not LE. However, pt reported she was in pain, so the propofol was increased again.  Pt to get HD today   VITAL SIGNS: BP (!) 104/56 (BP Location: Left Arm)   Pulse 92   Temp 100 F (37.8 C) (Oral)   Resp 12   Ht 5\' 1"  (1.549 m)   Wt 135 lb 12.9 oz (61.6 kg)   LMP 09/21/2013   SpO2 100%   BMI 25.66 kg/m   HEMODYNAMICS:  VENTILATOR SETTINGS: Vent Mode: PRVC FiO2 (%):  [30 %-40 %] 40 % Set Rate:  [12 bmp] 12 bmp Vt Set:  [380 mL] 380 mL PEEP:  [5 cmH20-8 cmH20] 5 cmH20 Pressure Support:  [5 cmH20] 5 cmH20 Plateau Pressure:  [10 cmH20-15 cmH20] 10 cmH20  INTAKE / OUTPUT: I/O last 3 completed shifts: In: [id number] [I.V.:542; Other:20; NG/GT:522; IV Piggyback:450] Out: 4592 [Urine:42;  Emesis/NG output:300; Other:4000; Stool:250]  PHYSICAL EXAMINATION: General: Chronically ill appearing F in NAD on vent  Neuro: Sedated on vent. Will open eyes to verbal stimulus. Follows commands HEENT: NCAT, PERRL. No scleral icterus Cardiovascular: rrr with normal s1s2. No m/r/g Lungs:  Even/nonlabored. CTAB, doing well on ps Abdomen: +BS. Soft, ?tender. Nondistended. umbilical hernia, soft Musculoskeletal: 2+ pitting edema BLE  Skin:  Warm, dry, intact. No rashes   LABS:  BMET  Last Labs    Recent Labs Lab 09/23/16 0210 09/23/16 1636 09/24/16 0211  NA 134* 137 136  K 4.5 4.1 4.1  CL 89* 93* 95*  CO2 23 24 23   BUN 69* 76* 81*  CREATININE 13.29* 14.26* 14.99*  GLUCOSE 105* 107* 109*      Electrolytes  Last Labs    Recent Labs Lab 09/23/16 0210 09/23/16 1636 09/24/16 0211  CALCIUM 8.8* 8.8* 8.6*  MG 2.8*  --  3.1*  PHOS 7.2* 8.7* 9.7*      CBC  Last Labs    Recent Labs Lab 09/23/16 0210 09/23/16 1636 09/24/16 0211  WBC 7.4 7.1 7.3  HGB 9.0* 8.4* 9.1*  HCT 29.0* 27.1* 29.6*  PLT 64* 61* 61*      Coag's  Last Labs    Recent Labs Lab 09/22/16 1715  APTT 31  INR 1.04      Sepsis Markers  Last Labs    Recent Labs  Lab 09/22/16 1716 09/22/16 1729 09/23/16 0210  LATICACIDVEN 1.84 2.00*  --   PROCALCITON  --   --  5.12      ABG  Last Labs    Recent Labs Lab 09/22/16 1732 09/23/16 0600  PHART 7.360 7.485*  PCO2ART 43.5 34.2  PO2ART 328* 108      Liver Enzymes  Last Labs    Recent Labs Lab 09/19/16 1417 09/22/16 1715 09/23/16 1636  AST 74* 61*  --   ALT 34 32  --   ALKPHOS 148* 127*  --   BILITOT 1.6* 1.6*  --   ALBUMIN 3.8 4.0 2.8*      Cardiac Enzymes Last Labs   No results for input(s): TROPONINI, PROBNP in the last 168 hours.    Glucose  Last Labs    Recent Labs Lab 09/23/16 1138 09/23/16 1604 09/23/16 1949 09/23/16 2324 09/24/16 0357 09/24/16 0742  GLUCAP 105*  108* 100* 101* 112* 119*      Imaging Dg Chest Port 1 View  Result Date: 09/24/2016 CLINICAL DATA:  ET tube placement. EXAM: PORTABLE CHEST 1 VIEW COMPARISON:  09/23/2016. FINDINGS: ET tube 3 cm above carina. The heart is enlarged. Moderate-size LEFT effusion is slightly improved. Bibasilar subsegmental atelectasis. Increasing opacity at the RIGHT lung base could represent developing pneumonia. Overall there is slightly less retrocardiac density, which may be due to decreasing LEFT effusion. IMPRESSION: Slight worsening aeration in the RIGHT lower lung zone could represent developing pneumonia. Slight improvement LEFT pleural effusion. Electronically Signed   By: Staci Righter M.D.   On: 09/24/2016 07:24   Dg Chest Port 1 View  Result Date: 09/23/2016 CLINICAL DATA:  Hypoxia EXAM: PORTABLE CHEST 1 VIEW COMPARISON:  September 22, 2016 FINDINGS: Endotracheal tube tip is 1.3 cm above the carina. Nasogastric tube tip and side port below the diaphragm. No pneumothorax. There is consolidation in the left lower lobe, increased, with new small left pleural effusion. Right lung is clear. Heart is borderline enlarged with pulmonary vascularity within normal limits. There is atherosclerotic calcification in the aorta. No adenopathy. No bone lesions. IMPRESSION: Endotracheal tube tip near the carina. Advise withdrawing endotracheal tube approximately 2 cm. Left lower lobe consolidation with small left pleural effusion, increased from 1 day prior. Right lung remains clear. Stable cardiac silhouette. There is aortic atherosclerosis. Electronically Signed   By: Lowella Grip III M.D.   On: 09/23/2016 13:09     STUDIES:  CT head 2/13 > Calcification in each carotid siphon region. No intracranial mass, hemorrhage, or extra-axial fluid collection. Gray-white compartments appear normal. EEG 2/14 >> Diffuse slowing of background. Diffuse cerebral dysfunction that is nonspecific in etiology and can be seen  with hypoxic/ischemioc injury, toxic/metabolic encephalopathies, or medication effect from propofol. Cannot r/o dx of epilepsy.   CULTURES: BCx2 2/13 > Urine 2/13 > Tracheal asp 2/13 > UA 2/14 >> positive nitrites, large leuks, many bacteria, WBC 6-30, RBC TNTC Peritoneal culture 2/14 >> Neutrophils 10, WBC 49 UDS >> positive for benzos  ANTIBIOTICS: Zosyn 2/13 > Vancomycin  2/13 >  SIGNIFICANT EVENTS: 2/13 admit for AMS, on vent  LINES/TUBES: ETT 2/13 > 2/15   ASSESSMENT / PLAN:  PULMONARY A: Inability to protect airway due to encephalopathy Possible RLL PNA, concern for aspiration P:   Extubate today if possible Titrate O2 for sats >92% Trend CXR and ABG Continue abx  CARDIOVASCULAR A:  Chronic hypotension P:  ICU monitoring Midodrine 10 mg on HD days  RENAL A:  ESRD on HD (tues, thurs, sat) Hyperphosphatemia Hypermagnesemia P:   Nephrology following, appreciate input Trend BMP/Mag/phos HD t/t/sa Replace electrolytes as indicated  GASTROINTESTINAL A:   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 H/o Varices Umbilical hernia P:   TF begin 2/14 NPO Protonix for SUP Follow paracentesis cultures  HEMATOLOGIC  Recent Labs  09/23/16 1636 09/24/16 0211  HGB 8.4* 9.1*    A:   Thrombocytopenia (chornic) Anemia (chronic) P:  Trend CBC coags wnl  INFECTIOUS A:   Possible RLL PNA UTI P:   Continue abx as above Follow paracentesis cxs  ENDOCRINE CBG (last 3)   Recent Labs  09/23/16 2324 09/24/16 0357 09/24/16 0742  GLUCAP 101* 112* 119*     A:   Hyperglycemia P:   CBG and SSI  NEUROLOGIC A:   Hepatic encephalopathy Agitation P:   RASS goal: 1 Continue lactulose EEG 2/14 showed nonspecific cerebral dysfunction D/c propofol  Fentanyl prn for pain   FAMILY  - Updates: no family present at bedside 2/15  - Inter-disciplinary family meet or Palliative Care meeting due by:  2/19   App cct 30  min  Richardson Landry Minor ACNP [facility] PCCM Pager [phone] till 3 pm If no answer page [phone] 09/24/2016, 10:23 AM  Attending Note:  41 year old female with liver disease and ESRD who was intubated due to encephalopathy, likely hepatic.  Ammonia level at 78.  On exam, lethargic but much more arousable with clear lungs.  I reviewed CXR myself, ETT ok and concern for RLL infiltrate.  Paracentesis performed yesterday and 3.6 liters were removed without hemodynamic compromise.  HD to be done today.  UTI and concern for PNA, will continue vanc/zosyn.  No concern for SBP as WBC in fluid was low.  Hope is to wean to extubate today.  The patient is critically ill with multiple organ systems failure and requires high complexity decision making for assessment and support, frequent evaluation and titration of therapies, application of advanced monitoring technologies and extensive interpretation of multiple databases.   Critical Care Time devoted to patient care services described in this note is  35  Minutes. This time reflects time of care of this signee Dr Jennet Maduro. This critical care time does not reflect procedure time, or teaching time or supervisory time of PA/NP/Med student/Med Resident etc but could involve care discussion time.  Rush Farmer, M.D. [facility] Pulmonary/Critical Care Medicine. Pager: [phone]. After hours pager: [phone].

## 2016-09-24 NOTE — Care Management Note (Signed)
Case Management Note  Patient Details  Name: Wanda [name] MRN: [id number] Date of Birth: 1976-02-20  Subjective/Objective:  Pt admitted with AMS - missed HD for a week                  Action/Plan:   PTA from home - ESRD on outpt HD.  Pt is now intubated - no family at bedside.  Pt was recommended for HH/DME on previous admit however refused.  CM will continue to follow for discharge needs   Expected Discharge Date:                  Expected Discharge Plan:     In-House Referral:     Discharge planning Services  CM Consult  Post Acute Care Choice:    Choice offered to:     DME Arranged:    DME Agency:     HH Arranged:    HH Agency:     Status of Service:  In process, will continue to follow  If discussed at Long Length of Stay Meetings, dates discussed:    Additional Comments:  Maryclare Labrador, RN 09/24/2016, 11:29 AM

## 2016-09-24 NOTE — Progress Notes (Signed)
[facility] Progress Note   Assessment/ Plan:   1  Acute encephalopathy: possibly due to HE + sepsis.  CT head without acute findings.  Per primary.  2. Sepsis: sputum culture with + Gm neg coccobacilli and rare GPCs in pairs, UA dirty, culture pending. Peritoneal fluid with PMNs on gm stain, culture pending.  Vanc/ Zosyn. Per primary 3. Acute hypoxic respiratory failure: intubated.  Per CCM.  Weaning currently 4  ESRD: Tuesday/ Thursday/ Friday [facility].   Next HD today 2/15.  Hold heparin as plts 64.   5  ESLD: gets regular paracenteses. Has had SBP before.  S/p 3.6 L paracentesis 2/14 6  Chronic hypotension: on midodrine 10 mg before HD  7. Anemia of ESRD: Epo 8000 u TIW; Hgb 9.0 2/14, Aranesp 60 mcg q week start 0/60 8 Metabolic Bone Disease: holding binders until taking PO 9 Nutrition: albumin 4.0  Subjective:    More responsive this AM ; had 3.6L paracentesis yesterday.     Objective:   BP (!) 104/56 (BP Location: Left Arm)   Pulse 92   Temp 100 F (37.8 C) (Oral)   Resp 12   Ht 5' 1"  (1.549 m)   Wt 61.6 kg (135 lb 12.9 oz)   LMP 09/21/2013   SpO2 100%   BMI 25.66 kg/m   Physical Exam: GEN: chronically ill appearing, responds to noxious stimuli but doesn't follow commands  HEENT: PERRL, sclerae muddy NECK Supple, no overt JVD but some neck fullness PULM: expiratory rhonchi bilaterally R > L CV RRR soft systolic murmur ABD distension and fluid wave much less pronounced today, easily reducible umbilical hernia.  Hypoactive bowel sounds.  Diffusely tender EXT SCDs in place, + pitting edema to the sacrum NEURO intubated and sedated  Labs: BMET  Recent Labs Lab 09/19/16 1417 09/22/16 1712 09/22/16 1715 09/23/16 0210 09/23/16 1636 09/24/16 0211  NA 136 135 134* 134* 137 136  K 4.5 3.7 3.7 4.5 4.1 4.1  CL 93* 96* 91* 89* 93* 95*  CO2 21*  --  23 23 24 23   GLUCOSE 133* 164* 159* 105* 107* 109*  BUN 129* 53* 62* 69* 76* 81*  CREATININE 18.82*  12.40* 11.14* 13.29* 14.26* 14.99*  CALCIUM 9.5  --  9.1 8.8* 8.8* 8.6*  PHOS  --   --   --  7.2* 8.7* 9.7*   CBC  Recent Labs Lab 09/19/16 1417  09/22/16 1715 09/23/16 0210 09/23/16 1636 09/24/16 0211  WBC 5.2  --  8.7 7.4 7.1 7.3  NEUTROABS 4.2  --  7.8*  --   --   --   HGB 10.1*  < > 11.1* 9.0* 8.4* 9.1*  HCT 31.5*  < > 34.1* 29.0* 27.1* 29.6*  MCV 100.3*  --  99.1 100.7* 101.9* 102.1*  PLT 75*  --  96* 64* 61* 61*  < > = values in this interval not displayed.  @IMGRELPRIORS @ Medications:    . chlorhexidine gluconate (MEDLINE KIT)  15 mL Mouth Rinse BID  . darbepoetin (ARANESP) injection - DIALYSIS  60 mcg Intravenous Q Thu-HD  . feeding supplement (NEPRO CARB STEADY)  1,000 mL Oral Q24H  . feeding supplement (PRO-STAT SUGAR FREE 64)  30 mL Per Tube TID  . Influenza vac split quadrivalent PF  0.5 mL Intramuscular Tomorrow-1000  . lactulose  30 g Oral TID  . mouth rinse  15 mL Mouth Rinse QID  . midodrine  10 mg Per Tube Q T,Th,Sa-HD  . pantoprazole (PROTONIX)  IV  40 mg Intravenous QHS  . piperacillin-tazobactam (ZOSYN)  IV  3.375 g Intravenous Q12H  . pneumococcal 23 valent vaccine  0.5 mL Intramuscular Tomorrow-1000  . vancomycin  750 mg Intravenous Q T,Th,Sa-HD     Madelon Lips MD [facility] pgr [phone] 09/24/2016, 9:08 AM

## 2016-09-25 ENCOUNTER — Inpatient Hospital Stay (HOSPITAL_COMMUNITY): Payer: Medicare Other

## 2016-09-25 DIAGNOSIS — A419 Sepsis, unspecified organism: Principal | ICD-10-CM

## 2016-09-25 DIAGNOSIS — N186 End stage renal disease: Secondary | ICD-10-CM

## 2016-09-25 DIAGNOSIS — Z992 Dependence on renal dialysis: Secondary | ICD-10-CM

## 2016-09-25 DIAGNOSIS — G9340 Encephalopathy, unspecified: Secondary | ICD-10-CM

## 2016-09-25 LAB — CBC
HCT: 28.7 % — ABNORMAL LOW (ref 36.0–46.0)
HEMOGLOBIN: 8.7 g/dL — AB (ref 12.0–15.0)
MCH: 31 pg (ref 26.0–34.0)
MCHC: 30.3 g/dL (ref 30.0–36.0)
MCV: 102.1 fL — ABNORMAL HIGH (ref 78.0–100.0)
Platelets: 64 10*3/uL — ABNORMAL LOW (ref 150–400)
RBC: 2.81 MIL/uL — AB (ref 3.87–5.11)
RDW: 18.3 % — ABNORMAL HIGH (ref 11.5–15.5)
WBC: 6.4 10*3/uL (ref 4.0–10.5)

## 2016-09-25 LAB — GLUCOSE, CAPILLARY
GLUCOSE-CAPILLARY: 93 mg/dL (ref 65–99)
GLUCOSE-CAPILLARY: 114 mg/dL — AB (ref 65–99)
GLUCOSE-CAPILLARY: 102 mg/dL — AB (ref 65–99)
Glucose-Capillary: 96 mg/dL (ref 65–99)
Glucose-Capillary: 88 mg/dL (ref 65–99)
Glucose-Capillary: 111 mg/dL — ABNORMAL HIGH (ref 65–99)

## 2016-09-25 LAB — HEPATITIS B CORE ANTIBODY, TOTAL: HEP B C TOTAL AB: NEGATIVE

## 2016-09-25 LAB — HEPATITIS B E ANTIGEN: HEP B E AG: NEGATIVE

## 2016-09-25 LAB — BASIC METABOLIC PANEL
Anion gap: 15 (ref 5–15)
BUN: 34 mg/dL — ABNORMAL HIGH (ref 6–20)
CHLORIDE: 95 mmol/L — AB (ref 101–111)
CO2: 27 mmol/L (ref 22–32)
Calcium: 8.7 mg/dL — ABNORMAL LOW (ref 8.9–10.3)
Creatinine, Ser: 8.19 mg/dL — ABNORMAL HIGH (ref 0.44–1.00)
GFR calc non Af Amer: 5 mL/min — ABNORMAL LOW (ref >60)
GFR, EST AFRICAN AMERICAN: 6 mL/min — AB (ref >60)
Glucose, Bld: 96 mg/dL (ref 65–99)
Potassium: 3.7 mmol/L (ref 3.5–5.1)
SODIUM: 137 mmol/L (ref 135–145)

## 2016-09-25 LAB — CULTURE, RESPIRATORY: CULTURE: NORMAL

## 2016-09-25 LAB — HEPATITIS B SURFACE ANTIGEN: Hepatitis B Surface Ag: NEGATIVE

## 2016-09-25 LAB — AMMONIA: Ammonia: 31 umol/L (ref 9–35)

## 2016-09-25 LAB — LACTIC ACID, PLASMA: LACTIC ACID, VENOUS: 1.4 mmol/L (ref 0.5–1.9)

## 2016-09-25 LAB — PHOSPHORUS: PHOSPHORUS: 6.3 mg/dL — AB (ref 2.5–4.6)

## 2016-09-25 LAB — MAGNESIUM: MAGNESIUM: 2.3 mg/dL (ref 1.7–2.4)

## 2016-09-25 MED ORDER — FENTANYL CITRATE (PF) 100 MCG/2ML IJ SOLN
12.5000 ug | Freq: Once | INTRAMUSCULAR | Status: AC
  Administered 2016-09-25: 12.5 ug via INTRAVENOUS
  Filled 2016-09-25: qty 2

## 2016-09-25 MED ORDER — MIDODRINE HCL 5 MG PO TABS
10.0000 mg | ORAL_TABLET | Freq: Every morning | ORAL | Status: DC
  Administered 2016-09-25 – 2016-09-27 (×3): 10 mg via ORAL
  Filled 2016-09-25 (×3): qty 2

## 2016-09-25 NOTE — Progress Notes (Signed)
ADMISSION DATE:  09/22/2016 CONSULTATION DATE:  2/13  REFERRING MD:  Dr. 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 She has reportedly not been to dialysis for over one week because she was feeling "unwell and unmotivated". She did go to HD 2/13, but was very confused and combative while there. She had 2.7 L dialyzed and subsequently became unresponsive prompting EMS to be called.  On arrival to ED she was noted to have GCS 3 and was intubated for airway protection. She had some clonus of lower extremities raising concern for hepatic encephalopathy, also some clonic jerking of upper extremities with painful stimuli. Exam also demonstrated hypothermia, tachycardia, and normotension. CT head and CXR were non-acute. Unable to collect urine for UA. She was started on broad spectrum antibiotics and was transferred to [facility] for further evaluation.   SUBJECTIVE:  Awake and alert   VITAL SIGNS: BP (!) 91/57   Pulse 92   Temp 99 F (37.2 C) (Oral)   Resp 14   Ht 5\' 1"  (1.549 m)   Wt 130 lb 15.3 oz (59.4 kg)   LMP 09/21/2013   SpO2 97%   BMI 24.74 kg/m   HEMODYNAMICS:  VENTILATOR SETTINGS:   INTAKE / OUTPUT: I/O last 3 completed shifts: In: [id number] [I.V.:542; Other:20; NG/GT:522; IV Piggyback:450] Out: 4592 [Urine:42; Emesis/NG output:300; Other:4000; Stool:250]  PHYSICAL EXAMINATION: General: Chronically ill appearing F awake and extubated Neuro:. Follows commands, wants pain meds HEENT: NCAT, PERRL. No scleral icterus Cardiovascular: rrr with normal s1s2. No m/r/g Lungs:  Even/nonlabored. CTAB, Abdomen: +BS. Soft, ?tender. Nondistended. umbilical hernia, soft Musculoskeletal: 2+ pitting edema BLE  Skin:  Warm, dry, intact. No rashes   LABS:   Recent Labs Lab 09/23/16 1636  09/24/16 0211 09/25/16 0120  NA 137 136 137  K 4.1 4.1 3.7  CL 93* 95* 95*  CO2 24 23 27   BUN 76* 81* 34*  CREATININE 14.26* 14.99* 8.19*  GLUCOSE 107* 109* 96    Recent Labs Lab 09/23/16 1636 09/24/16 0211 09/25/16 0120  HGB 8.4* 9.1* 8.7*  HCT 27.1* 29.6* 28.7*  WBC 7.1 7.3 6.4  PLT 61* 61* 64*     Glucose  Last Labs    CBG (last 3)   Recent Labs  09/24/16 2330 09/25/16 0326 09/25/16 0830  GLUCAP 108* 93 96     Imaging Dg Chest Port 1 View  Result Date: 09/24/2016 CLINICAL DATA:  ET tube placement. EXAM: PORTABLE CHEST 1 VIEW COMPARISON:  09/23/2016. FINDINGS: ET tube 3 cm above carina. The heart is enlarged. Moderate-size LEFT effusion is slightly improved. Bibasilar subsegmental atelectasis. Increasing opacity at the RIGHT lung base could represent developing pneumonia. Overall there is slightly less retrocardiac density, which may be due to decreasing LEFT effusion. IMPRESSION: Slight worsening aeration in the RIGHT lower lung zone could represent developing pneumonia. Slight improvement LEFT pleural effusion. Electronically Signed   By: Staci Righter M.D.   On: 09/24/2016 07:24   Dg Chest Port 1 View  Result Date: 09/23/2016 CLINICAL DATA:  Hypoxia EXAM: PORTABLE CHEST 1 VIEW COMPARISON:  September 22, 2016 FINDINGS: Endotracheal tube tip is 1.3 cm above the carina. Nasogastric tube tip and side port below the diaphragm. No pneumothorax. There is consolidation in the left lower lobe, increased, with new small left pleural effusion.  Right lung is clear. Heart is borderline enlarged with pulmonary vascularity within normal limits. There is atherosclerotic calcification in the aorta. No adenopathy. No bone lesions. IMPRESSION: Endotracheal tube tip near the carina. Advise withdrawing endotracheal tube approximately 2 cm. Left lower lobe consolidation with small left pleural effusion, increased from 1 day prior. Right lung remains clear. Stable cardiac silhouette.  There is aortic atherosclerosis. Electronically Signed   By: Lowella Grip III M.D.   On: 09/23/2016 13:09     STUDIES:  CT head 2/13 > Calcification in each carotid siphon region. No intracranial mass, hemorrhage, or extra-axial fluid collection. Gray-white compartments appear normal. EEG 2/14 >> Diffuse slowing of background. Diffuse cerebral dysfunction that is nonspecific in etiology and can be seen with hypoxic/ischemioc injury, toxic/metabolic encephalopathies, or medication effect from propofol. Cannot r/o dx of epilepsy.   CULTURES: BCx2 2/13 > Urine 2/13 >neg Tracheal asp 2/13 > UA 2/14 >> positive nitrites, large leuks, many bacteria, WBC 6-30, RBC TNTC Peritoneal culture 2/14 >> Neutrophils 10, WBC 49 UDS >> positive for benzos  ANTIBIOTICS: Zosyn 2/13 > Vancomycin  2/13 >  SIGNIFICANT EVENTS: 2/13 admit for AMS, on vent  LINES/TUBES: ETT 2/13 > 2/15   ASSESSMENT / PLAN:  PULMONARY A: Inability to protect airway due to encephalopathy Possible RLL PNA, concern for aspiration P:   Extubated 2/15 Titrate O2 for sats >92% Trend CXR and ABG Continue abx Tx to SDU and to triad service as of 2/17  CARDIOVASCULAR A:  Chronic hypotension P:  ICU monitoring Midodrine 10 mg on HD days  RENAL A:   ESRD on HD (tues, thurs, sat) Hyperphosphatemia Hypermagnesemia P:   Nephrology following, appreciate input Trend BMP/Mag/phos HD t/t/sa Replace electrolytes as indicated  GASTROINTESTINAL A:   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 H/o Varices Umbilical hernia P:   Advance diet  HEMATOLOGIC  Recent Labs  09/24/16 0211 09/25/16 0120  HGB 9.1* 8.7*    A:   Thrombocytopenia (chornic) Anemia (chronic) P:  Trend CBC coags wnl  INFECTIOUS A:   Possible RLL PNA UTI P:   Continue abx as above Follow paracentesis cxs  ENDOCRINE CBG (last 3)   Recent Labs  09/24/16 2330 09/25/16 0326 09/25/16 0830  GLUCAP 108* 93 96      A:   Hyperglycemia P:   CBG and SSI  NEUROLOGIC A:   Hepatic encephalopathy Agitation P:   RASS goal: 1 Continue lactulose EEG 2/14 showed nonspecific cerebral dysfunction Awake 2/16 and requesting pain meds   FAMILY  - Updates: no family present at bedside 2/15  - Inter-disciplinary family meet or Palliative Care meeting due by:  2/19   App cct 30 min  Richardson Landry Minor ACNP [facility] Shape PCCM Pager [phone] till 3 pm If no answer page [phone] 09/25/2016, 10:35 AM

## 2016-09-25 NOTE — Progress Notes (Signed)
eLink Physician-Brief Progress Note Patient Name: Wanda [name] DOB: 1976/05/30 MRN: [id number]   Date of Service  09/25/2016  HPI/Events of Note  Patient complaining again of pain.   She has chronic pain issues for which she takes opiates.   She is admitted with encephalopathy most likely related to pain medicines.  Extubated.  Transferred to 4 E.   eICU Interventions  Will give fentanyl, 12.5 g, IV, 1 dose now.   She got a dose earlier.   Minimizing pain medication given her comorbidities and her clinical presentation.      Intervention Category Minor Interventions: Other: Evaluation Type: Other  [place] 09/25/2016, 11:18 PM

## 2016-09-25 NOTE — Progress Notes (Signed)
eLink Physician-Brief Progress Note Patient Name: Wanda [name] DOB: 11-Mar-1976 MRN: [id number]   Date of Service  09/25/2016  HPI/Events of Note  Patient c/o back and stomach pain. Patient on PO Percocet and Oxycodone.  eICU Interventions  Will order: 1. Fentanyl 12.5 mcg IV X 1 now.      Intervention Category Intermediate Interventions: Pain - evaluation and management  Renna Kilmer [place] 09/25/2016, 8:10 PM

## 2016-09-25 NOTE — Progress Notes (Signed)
[facility] Progress Note   Assessment/ Plan:   1  Acute encephalopathy: possibly due to HE + sepsis.  CT head without acute findings.  Per primary.  2. Sepsis: sputum culture with + Gm neg coccobacilli and rare GPCs in pairs, UA dirty, culture no growth though. Peritoneal fluid with PMNs on gm stain, culture pending.  Vanc/ Zosyn. Per primary 3. Acute hypoxic respiratory failure: resolved.  Extubated 4  ESRD: Tuesday/ Thursday/ Friday [facility].   Next HD tomorrow 2/17.  Hold heparin as plts 64.   5  ESLD: gets regular paracenteses. Has had SBP before.  S/p 3.6 L paracentesis 2/14 6  Chronic hypotension: on midodrine 10 mg before HD  7. Anemia of ESRD: Epo 8000 u TIW; Hgb 9.0 2/14, Aranesp 60 mcg q week start [id number] Metabolic Bone Disease: holding binders until taking PO 9 Nutrition: albumin 4.0  Subjective:    Extubated, reports abd pain and requesting morphine.  Request specifically denied for morphine in setting of ESRD (does have fentanyl which is appropriate for ESRD).   Objective:   BP (!) 81/40   Pulse 73   Temp 99 F (37.2 C) (Oral)   Resp 14   Ht 5\' 1"  (1.549 m)   Wt 59.4 kg (130 lb 15.3 oz)   LMP 09/21/2013   SpO2 98%   BMI 24.74 kg/m   Physical Exam: GEN: chronically ill appearing, awake and alert sitting in bed HEENT: PERRL, sclerae muddy NECK Supple, no JVD PULM: expiratory rhonchi bilaterally R > L, improved CV RRR soft systolic murmur ABD distension and fluid wave much less pronounced today, easily reducible umbilical hernia.  Hypoactive bowel sounds.  Diffusely tender EXT SCDs in place, + pitting edema to the sacrum, improved NEURO intubated and sedated  Labs: BMET  Recent Labs Lab 09/19/16 1417 09/22/16 1712 09/22/16 1715 09/23/16 0210 09/23/16 1636 09/24/16 0211 09/25/16 0120  NA 136 135 134* 134* 137 136 137  K 4.5 3.7 3.7 4.5 4.1 4.1 3.7  CL 93* 96* 91* 89* 93* 95* 95*  CO2 21*  --  23 23 24 23 27   GLUCOSE 133* 164* 159*  105* 107* 109* 96  BUN 129* 53* 62* 69* 76* 81* 34*  CREATININE 18.82* 12.40* 11.14* 13.29* 14.26* 14.99* 8.19*  CALCIUM 9.5  --  9.1 8.8* 8.8* 8.6* 8.7*  PHOS  --   --   --  7.2* 8.7* 9.7* 6.3*   CBC  Recent Labs Lab 09/19/16 1417  09/22/16 1715 09/23/16 0210 09/23/16 1636 09/24/16 0211 09/25/16 0120  WBC 5.2  --  8.7 7.4 7.1 7.3 6.4  NEUTROABS 4.2  --  7.8*  --   --   --   --   HGB 10.1*  < > 11.1* 9.0* 8.4* 9.1* 8.7*  HCT 31.5*  < > 34.1* 29.0* 27.1* 29.6* 28.7*  MCV 100.3*  --  99.1 100.7* 101.9* 102.1* 102.1*  PLT 75*  --  96* 64* 61* 61* 64*  < > = values in this interval not displayed.  @IMGRELPRIORS @ Medications:    . darbepoetin (ARANESP) injection - DIALYSIS  60 mcg Intravenous Q Thu-HD  . Influenza vac split quadrivalent PF  0.5 mL Intramuscular Tomorrow-1000  . lactulose  30 g Oral TID  . mouth rinse  15 mL Mouth Rinse BID  . midodrine  10 mg Per Tube Q T,Th,Sa-HD  . pantoprazole (PROTONIX) IV  40 mg Intravenous QHS  . piperacillin-tazobactam (ZOSYN)  IV  3.375 g  Intravenous Q12H  . pneumococcal 23 valent vaccine  0.5 mL Intramuscular Tomorrow-1000  . vancomycin  750 mg Intravenous Q T,Th,Sa-HD     Madelon Lips MD [facility] pgr [phone] 09/25/2016, 8:09 AM

## 2016-09-25 NOTE — Progress Notes (Signed)
[facility] Attending Note  Presenting HPI:  41 y.o. female with known history of end-stage renal disease on hemodialysis with scheduled sessions Tuesday, Thursday, and Saturday as well as liver cirrhosis with ascites and history of varices. Patient also has known history of chronic hypertension and is on Minitran intermittently. Patient did not attend dialysis for over a week due to feeling "unwell and unmotivated". During a hemodialysis session on 2/13 she became confused and combative. Patient became progressively unresponsive and EMS was called. Patient was transferred to the emergency department where she was intubated for airway protection. Patient did have some tonic-clonic jerking of upper extremities as well as hypothermia, tachycardia, and normotension on presentation. Patient was initiated on broad-spectrum antibiotics. She was subsequently transferred to our facility for further treatment.  Subjective:  No chest pain or pressure. Reports diffuse abdominal pain as well as lower back pain. No acute events overnight.  Review of Systems:  Denies any subjective fever or chills. Denies any headache or acute vision changes. Denies any dyspnea or cough. Reports a scratchy throat.  Temp:  [97.5 F (36.4 C)-99.1 F (37.3 C)] 98.8 F (37.1 C) (02/16 1206) Pulse Rate:  [72-117] 86 (02/16 1400) Resp:  [12-22] 14 (02/16 1400) BP: (72-109)/(30-58) 88/44 (02/16 1400) SpO2:  [93 %-100 %] 96 % (02/16 1400) Weight:  [130 lb 1.1 oz (59 kg)-130 lb 15.3 oz (59.4 kg)] 130 lb 15.3 oz (59.4 kg) (02/16 0500)  General:  Awake. No distress. Alert. Boyfriend at bedside. Integument:  Warm & dry. No rash on exposed skin. Bruising of various ages on exposed skin of chest and upper extremities. HEENT:  No scleral injection. Pupils symmetric.  Pulmonary:  Clear bilaterally to auscultation. No accessory muscle use on room air. Good aeration bilaterally.  Cardiovascular:  Regular rate & rhythm.  No JVD apprecaited. No edema. Abdomen:  Soft. Diffusely tender to palpation. Normal bowel sounds. Neurological:  Cranial nerves grossly in tact. No meningismus. Moving all 4 extremities equally. Oriented x4.   CBC Latest Ref Rng & Units 09/25/2016 09/24/2016 09/23/2016  WBC 4.0 - 10.5 K/uL 6.4 7.3 7.1  Hemoglobin 12.0 - 15.0 g/dL 8.7(L) 9.1(L) 8.4(L)  Hematocrit 36.0 - 46.0 % 28.7(L) 29.6(L) 27.1(L)  Platelets 150 - 400 K/uL 64(L) 61(L) 61(L)    BMP Latest Ref Rng & Units 09/25/2016 09/24/2016 09/23/2016  Glucose 65 - 99 mg/dL 96 109(H) 107(H)  BUN 6 - 20 mg/dL 34(H) 81(H) 76(H)  Creatinine 0.44 - 1.00 mg/dL 8.19(H) 14.99(H) 14.26(H)  Sodium 135 - 145 mmol/L 137 136 137  Potassium 3.5 - 5.1 mmol/L 3.7 4.1 4.1  Chloride 101 - 111 mmol/L 95(L) 95(L) 93(L)  CO2 22 - 32 mmol/L 27 23 24   Calcium 8.9 - 10.3 mg/dL 8.7(L) 8.6(L) 8.8(L)    IMAGING/STUDIES: CT HEAD W/O 2/13:  Calcification in each carotid siphon region. No intracranial mass, hemorrhage, or extra-axial fluid collection. Gray-white compartments appear normal.  EtOH 2/13:  Negative UDS 2/14:  Positive Benzos  EEG 2/14:  This sedated EEG is abnormal due to moderate diffuse slowing of the background. Clinical Correlation of the above findings indicates diffuse cerebral dysfunction that is non-specific in etiology and can be seen with hypoxic/ischemic injury, toxic/metabolic encephalopathies,  or medication effect from Propolol.  The absence of epileptiform discharges does not rule out a clinical diagnosis of epilepsy.  PORT CXR 2/16:  Personally reviewed by me. No focal opacity or effusion appreciated. Mediastinum and heart contours appear normal.  MICROBIOLOGY: MRSA PCR 2/13:  Negative Blood Cultures x2 2/13 >>> Tracheal Aspirate Culture 2/14:  Normal oral flora. Urine Culture 2/14:  Negative  Peritoneal Fluid Culture 2/14 >>> (WBC 49)  ANTIBIOTICS: Zosyn 2/14 >>> Vancomycin 2/14 >>>  ASSESSMENT/PLAN:  41 y.o. admitted with  acute encephalopathy and previously intubated for airway protection. Patient successfully explained 2/15. Mental status is progressively continuing to improve although she is continuing to complain of abdominal pain. Patient did undergo paracentesis previously without a cell count that would suggest spontaneous bacterial peritonitis however culture is pending. Patient's respiratory status remains stable. She does have chronic hypotension and is on Midrin intermittently. I did spend a significant amount time today discussing the role of cautious use of narcotics given the potential for worsening hypotension.  1. Acute encephalopathy: Likely multifactorial from toxic metabolic versus hepatic. Recommend caution with instituting any sedation or excessive narcotic regimen. Continuing lactulose scheduled. 2. Possible sepsis: Cultures pending. Continuing broad-spectrum antibiotics with vancomycin & Zosyn. 3. Chronic hypotension: Scheduling Midrin. Continuing to check vital signs per unit protocol. 4. Anemia & thrombocytopenia: Chronic. No signs of active bleeding. Continuing to trend cell counts daily with CBC. 5. End-stage renal disease on hemodialysis: Nephrology following. Hemodialysis per nephrology recommendations. Continuing to monitor electrolytes daily. 6. Chronic pain: Continuing to hold home oxycodone/Percocet given potential for worsening respiratory status & hypertension. 7. History of anxiety: Holding home Ativan for now. 8. Diet: Mechanical soft diet. 9. Prophylaxis: SCDs. Discontinuing Protonix given patient is extubated. Restarting home Carafate. 10. Disposition: Transferring patient to a stepdown unit.  [facility] to assume care & PCCM off as of 2/17.  I have spent a total of 39 minutes of time today caring for the patient, reviewing the patient's electronic medical record, and with more than 50% of that time spent coordinating care with the patient as well as reviewing the continuing plan of care  with the patient at bedside.  Sonia Baller Ashok Cordia, M.D. [facility] Pager:  [phone] After 3pm or if no response, call [phone] 2:20 PM 09/25/16

## 2016-09-26 LAB — CBC
HCT: 31 % — ABNORMAL LOW (ref 36.0–46.0)
Hemoglobin: 9.6 g/dL — ABNORMAL LOW (ref 12.0–15.0)
MCH: 31.4 pg (ref 26.0–34.0)
MCHC: 31 g/dL (ref 30.0–36.0)
MCV: 101.3 fL — ABNORMAL HIGH (ref 78.0–100.0)
Platelets: 92 10*3/uL — ABNORMAL LOW (ref 150–400)
RBC: 3.06 MIL/uL — ABNORMAL LOW (ref 3.87–5.11)
RDW: 18.3 % — AB (ref 11.5–15.5)
WBC: 7.6 10*3/uL (ref 4.0–10.5)

## 2016-09-26 LAB — GLUCOSE, CAPILLARY
GLUCOSE-CAPILLARY: 90 mg/dL (ref 65–99)
GLUCOSE-CAPILLARY: 64 mg/dL — AB (ref 65–99)
Glucose-Capillary: 84 mg/dL (ref 65–99)
Glucose-Capillary: 151 mg/dL — ABNORMAL HIGH (ref 65–99)
Glucose-Capillary: 123 mg/dL — ABNORMAL HIGH (ref 65–99)

## 2016-09-26 LAB — MAGNESIUM: MAGNESIUM: 2.4 mg/dL (ref 1.7–2.4)

## 2016-09-26 LAB — RENAL FUNCTION PANEL
ANION GAP: 20 — AB (ref 5–15)
Albumin: 2.9 g/dL — ABNORMAL LOW (ref 3.5–5.0)
BUN: 54 mg/dL — ABNORMAL HIGH (ref 6–20)
CALCIUM: 8.8 mg/dL — AB (ref 8.9–10.3)
CO2: 22 mmol/L (ref 22–32)
CREATININE: 9.98 mg/dL — AB (ref 0.44–1.00)
Chloride: 92 mmol/L — ABNORMAL LOW (ref 101–111)
GFR calc Af Amer: 5 mL/min — ABNORMAL LOW (ref >60)
GFR calc non Af Amer: 4 mL/min — ABNORMAL LOW (ref >60)
GLUCOSE: 97 mg/dL (ref 65–99)
Phosphorus: 8.4 mg/dL — ABNORMAL HIGH (ref 2.5–4.6)
Potassium: 3.9 mmol/L (ref 3.5–5.1)
SODIUM: 134 mmol/L — AB (ref 135–145)

## 2016-09-26 MED ORDER — OXYCODONE HCL 5 MG PO TABS
5.0000 mg | ORAL_TABLET | Freq: Four times a day (QID) | ORAL | Status: DC | PRN
  Administered 2016-09-26: 5 mg via ORAL
  Filled 2016-09-26: qty 1

## 2016-09-26 MED ORDER — OXYCODONE HCL 5 MG PO TABS
5.0000 mg | ORAL_TABLET | Freq: Three times a day (TID) | ORAL | Status: DC | PRN
  Administered 2016-09-27 (×2): 5 mg via ORAL
  Filled 2016-09-26 (×2): qty 1

## 2016-09-26 MED ORDER — FENTANYL CITRATE (PF) 100 MCG/2ML IJ SOLN: 12.5000 ug | Freq: Once | INTRAMUSCULAR | Status: DC

## 2016-09-26 MED ORDER — ONDANSETRON HCL 4 MG/2ML IJ SOLN
INTRAMUSCULAR | Status: AC
  Administered 2016-09-26: 4 mg via INTRAVENOUS
  Filled 2016-09-26: qty 2

## 2016-09-26 MED ORDER — IBUPROFEN 200 MG PO TABS: 200.0000 mg | ORAL_TABLET | ORAL | Status: DC | PRN

## 2016-09-26 MED ORDER — ONDANSETRON HCL 4 MG/2ML IJ SOLN
4.0000 mg | Freq: Four times a day (QID) | INTRAMUSCULAR | Status: DC | PRN
  Administered 2016-09-26 (×2): 4 mg via INTRAVENOUS
  Filled 2016-09-26: qty 2

## 2016-09-26 NOTE — Progress Notes (Signed)
Pharmacy Antibiotic Note  Wanda [name] is a 41 y.o. female admitted on 09/22/2016 with sepsis.  Pharmacy has been consulted for Vancomycin/Zosyn dosing. Here from [facility] with altered mental status requiring intubation. ESRD on HD Tues/Thurs/Sat. Missed HD x 1 week. Today is day #4 Antibiotics. WBC are within normal limits and patient is afebrile.   Plan: -Vancomycin 750 mg IV qHD Tues/Thurs/Sat -Zosyn 3.375G IV q12h to be infused over 4 hours -Trend WBC, temp, HD schedule -Drug levels as indicated  - F/U plan for further antibiotics    Height: 5\' 1"  (154.9 cm) Weight: 132 lb 7.9 oz (60.1 kg) IBW/kg (Calculated) : 47.8  Temp (24hrs), Avg:98.3 F (36.8 C), Min:98.1 F (36.7 C), Max:98.6 F (37 C)   Recent Labs Lab 09/22/16 1716 09/22/16 1729 09/23/16 0210 09/23/16 1636 09/24/16 0211 09/25/16 0119 09/25/16 0120 09/26/16 0356  WBC  --   --  7.4 7.1 7.3  --  6.4 7.6  CREATININE  --   --  13.29* 14.26* 14.99*  --  8.19* 9.98*  LATICACIDVEN 1.84 2.00*  --   --   --  1.4  --   --     Estimated Creatinine Clearance: 6.1 mL/min (by C-G formula based on SCr of 9.98 mg/dL (H)).    Allergies  Allergen Reactions  . Lasix [Furosemide] Other (See Comments)    "doesn't work"  . Latex Itching   Antimicrobials this admission:  Vancomycin 2/13 >> Zosyn 2/13 >>  Microbiology results:  2/13 MRSA PCR: negative 2/13 BCx: ngtd 2/14 Trach asp: normal resp flora 2/14 UA: Many bacteria, Lg hgb, + nitrite, 6-30 WBC 2/14 UCx: neg 2/14 Peritoneal fluid: NGTD    Ihor Austin , PharmD PGY1 Pharmacy Resident Pager: [phone] 09/26/2016 1:37 PM

## 2016-09-26 NOTE — Progress Notes (Signed)
[facility] TEAM 1 - Stepdown/ICU TEAM  Wanda [name]  [name] DOB: [birth year] DOA: 09/22/2016 PCP: [name], MD    Brief Narrative:  41 y.o. female with history of end-stage renal disease on hemodialysis with scheduled sessions Tuesday, Thursday, and Sat, as well as liver cirrhosis with ascites and varices. Patient also has known history of chronic hypotension and is on midodrine intermittently. Patient did not attend dialysis for over a week due to feeling "unwell and unmotivated". During a hemodialysis session on 2/13 she became confused and combative, then progressively unresponsive and EMS was called. Patient was transferred to the emergency department where she was intubated for airway protection. Patient did have some tonic-clonic jerking of upper extremities as well as hypothermia, and tachycardia. Patient was was subsequently transferred to our facility for further treatment.  Significant Events: 2/13 admit w/ AMS - intubated 2/14 paracentesis yielding 3.6L 2/15 extubated 2/17 [facility] assumed care  Subjective: The patient is somewhat slow to respond to questions and at times slurs her words as she appears to be sedated.  She is in no respiratory distress.  Nonetheless she tells me she is in excruciating pain and asks for me to significantly increase her pain medications.  Assessment & Plan:  Acute encephalopathy Likely multifactorial from toxic metabolic in setting of missed HD, overuse of sedating medications, as well as hepatic encephalopathy - caution with all sedating meds - continuing lactulose - CT head unremarkable - pt is unhappy with her current narcotic dose but I do not feel that further increase is safe as she is clearly somewhat sedated already   Acute hypoxic respiratory failure Due to AMS - resolved   Possible sepsis peritoneal fluid w/o growth - blood cx NGTD - trach aspirate w/ oral flora - MRSA screen negative - afebrile w/ normal WBC - no compelling  clinical evidence of infection at this time - stop abx and follow (has completed ~5 days of Zosyn)  Chronic hypotension Cont midodrine - BP appears to be stable at present   Chronic Anemia & thrombocytopenia No signs of active bleeding - Hgb and plt count are improving   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 Receives regular paracentesis - abdom not taut at present   End-stage renal disease on hemodialysis Nephrology following  Chronic pain Low dose prn oxycodone - DO NOT ADJUST DOSE UPWARD FURTHER - DO NOT USE IV NARCOTICS  History of anxiety Holding home Ativan   DVT prophylaxis: SCDs Code Status: FULL CODE Family Communication: no family present at time of exam  Disposition Plan: SDU   Consultants:  PCCM Nephrology   Antimicrobials:  Anti-infectives    Start     Dose/Rate Route Frequency Ordered Stop   09/24/16 1230  Vancomycin (VANCOCIN) 750-5 MG/150ML-% IVPB    Comments:  Cherylann Banas   : cabinet override      09/24/16 1230 09/24/16 1535   09/24/16 1200  vancomycin (VANCOCIN) IVPB 750 mg/150 ml premix     750 mg 150 mL/hr over 60 Minutes Intravenous Every T-Th-Sa (Hemodialysis) 09/23/16 0101     09/23/16 1000  piperacillin-tazobactam (ZOSYN) IVPB 3.375 g     3.375 g 12.5 mL/hr over 240 Minutes Intravenous Every 12 hours 09/23/16 0101     09/23/16 0115  vancomycin (VANCOCIN) 500 mg in sodium chloride 0.9 % 100 mL IVPB     500 mg 100 mL/hr over 60 Minutes Intravenous  Once 09/23/16 0101 09/23/16 0329   09/22/16 2015  vancomycin (VANCOCIN) IVPB  1000 mg/200 mL premix     1,000 mg 200 mL/hr over 60 Minutes Intravenous  Once 09/22/16 2002 09/22/16 2238   09/22/16 2015  piperacillin-tazobactam (ZOSYN) IVPB 3.375 g  Status:  Discontinued     3.375 g 100 mL/hr over 30 Minutes Intravenous  Once 09/22/16 2002 09/23/16 1150       Objective: Blood pressure (!) 104/46, pulse 78, temperature 98.3 F (36.8 C), temperature source Oral, resp. rate 14, height 5\' 1"   (1.549 m), weight 60.1 kg (132 lb 7.9 oz), last menstrual period 09/21/2013, SpO2 100 %.  Intake/Output Summary (Last 24 hours) at 09/26/16 1722 Last data filed at 09/26/16 1017  Gross per 24 hour  Intake             1020 ml  Output                0 ml  Net             1020 ml   Filed Weights   09/24/16 1631 09/25/16 0500 09/25/16 1937  Weight: 59 kg (130 lb 1.1 oz) 59.4 kg (130 lb 15.3 oz) 60.1 kg (132 lb 7.9 oz)    Examination: General: No acute respiratory distress - pt lethargic - pt winces and grimaces severely anywhere she is touched (at times before contact is actually made) Lungs: Clear to auscultation bilaterally without wheezes or crackles  Cardiovascular: Regular rate and rhythm without murmur gallop or rub normal S1 and S2 Abdomen: Nondistended, soft, bowel sounds positive, no rebound, no ascites, no appreciable mass Extremities: Pitting edema bilateral lower extremities  CBC:  Recent Labs Lab 09/22/16 1715 09/23/16 0210 09/23/16 1636 09/24/16 0211 09/25/16 0120 09/26/16 0356  WBC 8.7 7.4 7.1 7.3 6.4 7.6  NEUTROABS 7.8*  --   --   --   --   --   HGB 11.1* 9.0* 8.4* 9.1* 8.7* 9.6*  HCT 34.1* 29.0* 27.1* 29.6* 28.7* 31.0*  MCV 99.1 100.7* 101.9* 102.1* 102.1* 101.3*  PLT 96* 64* 61* 61* 64* 92*   Basic Metabolic Panel:  Recent Labs Lab 09/23/16 0210 09/23/16 1636 09/24/16 0211 09/25/16 0120 09/26/16 0356  NA 134* 137 136 137 134*  K 4.5 4.1 4.1 3.7 3.9  CL 89* 93* 95* 95* 92*  CO2 23 24 23 27 22   GLUCOSE 105* 107* 109* 96 97  BUN 69* 76* 81* 34* 54*  CREATININE 13.29* 14.26* 14.99* 8.19* 9.98*  CALCIUM 8.8* 8.8* 8.6* 8.7* 8.8*  MG 2.8*  --  3.1* 2.3 2.4  PHOS 7.2* 8.7* 9.7* 6.3* 8.4*   GFR: Estimated Creatinine Clearance: 6.1 mL/min (by C-G formula based on SCr of 9.98 mg/dL (H)).  Liver Function Tests:  Recent Labs Lab 09/22/16 1715 09/23/16 1636 09/26/16 0356  AST 61*  --   --   ALT 32  --   --   ALKPHOS 127*  --   --   BILITOT 1.6*   --   --   PROT 8.7*  --   --   ALBUMIN 4.0 2.8* 2.9*    Recent Labs Lab 09/22/16 1758 09/25/16 0120  AMMONIA 78* 31    Coagulation Profile:  Recent Labs Lab 09/22/16 1715  INR 1.04   CBG:  Recent Labs Lab 09/25/16 2321 09/26/16 0325 09/26/16 0833 09/26/16 1309 09/26/16 1520  GLUCAP 111* 90 64* 84 151*    Recent Results (from the past 240 hour(s))  Blood culture (routine x 2)     Status: None (Preliminary result)  Collection Time: 09/22/16  5:50 PM  Result Value Ref Range Status   Specimen Description BLOOD LEFT HAND  Final   Special Requests BOTTLES DRAWN AEROBIC AND ANAEROBIC 6CC EACH  Final   Culture NO GROWTH 4 DAYS  Final   Report Status PENDING  Incomplete  Blood culture (routine x 2)     Status: None (Preliminary result)   Collection Time: 09/22/16  5:56 PM  Result Value Ref Range Status   Specimen Description BLOOD LEFT WRIST  Final   Special Requests BOTTLES DRAWN AEROBIC ONLY 6CC EACH  Final   Culture NO GROWTH 4 DAYS  Final   Report Status PENDING  Incomplete  MRSA PCR Screening     Status: None   Collection Time: 09/22/16 11:01 PM  Result Value Ref Range Status   MRSA by PCR NEGATIVE NEGATIVE Final    Comment:        The GeneXpert MRSA Assay (FDA approved for NASAL specimens only), is one component of a comprehensive MRSA colonization surveillance program. It is not intended to diagnose MRSA infection nor to guide or monitor treatment for MRSA infections.   Culture, respiratory (tracheal aspirate)     Status: None   Collection Time: 09/23/16  6:46 AM  Result Value Ref Range Status   Specimen Description TRACHEAL ASPIRATE  Final   Special Requests NONE  Final   Gram Stain   Final    RARE WBC PRESENT,BOTH PMN AND MONONUCLEAR FEW GRAM NEGATIVE COCCOBACILLI RARE GRAM POSITIVE COCCI IN PAIRS    Culture Consistent with normal respiratory flora.  Final   Report Status 09/25/2016 FINAL  Final  Urine culture     Status: None   Collection  Time: 09/23/16  8:05 AM  Result Value Ref Range Status   Specimen Description URINE, CATHETERIZED  Final   Special Requests NONE  Final   Culture NO GROWTH  Final   Report Status 09/24/2016 FINAL  Final  Culture, body fluid-bottle     Status: None (Preliminary result)   Collection Time: 09/23/16  4:01 PM  Result Value Ref Range Status   Specimen Description FLUID PERITONEAL  Final   Special Requests BOTTLES DRAWN AEROBIC ONLY 6CC  Final   Culture NO GROWTH 3 DAYS  Final   Report Status PENDING  Incomplete  Gram stain     Status: None   Collection Time: 09/23/16  4:01 PM  Result Value Ref Range Status   Specimen Description FLUID PERITONEAL  Final   Special Requests NONE  Final   Gram Stain   Final    WBC PRESENT,BOTH PMN AND MONONUCLEAR NO ORGANISMS SEEN CYTOSPIN    Report Status 09/23/2016 FINAL  Final     Scheduled Meds: . darbepoetin (ARANESP) injection - DIALYSIS  60 mcg Intravenous Q Thu-HD  . Influenza vac split quadrivalent PF  0.5 mL Intramuscular Tomorrow-1000  . lactulose  30 g Oral TID  . mouth rinse  15 mL Mouth Rinse BID  . midodrine  10 mg Oral q morning - 10a  . piperacillin-tazobactam (ZOSYN)  IV  3.375 g Intravenous Q12H  . pneumococcal 23 valent vaccine  0.5 mL Intramuscular Tomorrow-1000  . vancomycin  750 mg Intravenous Q T,Th,Sa-HD     LOS: 4 days   Cherene Altes, MD [facility] Office  [phone] Pager - Text Page per Amion as per below:  On-Call/Text Page:      [email or web address]      password [email or web address]  If 7PM-7AM, please contact night-coverage  [email or web address] Password [email or web address] 09/26/2016, 5:22 PM

## 2016-09-26 NOTE — Progress Notes (Signed)
[facility] Progress Note   Assessment/ Plan:   1  Acute encephalopathy: possibly due to HE + sepsis.  CT head without acute findings.  Per primary.  2. Sepsis: sputum culture with + Gm neg coccobacilli and rare GPCs in pairs consistent with normal respiratory flora, UA dirty, culture no growth though. Peritoneal fluid with PMNs on gm stain, culture pending.  Vanc/ Zosyn. Per primary 3. Acute hypoxic respiratory failure: resolved.  Extubated 4  ESRD: Tuesday/ Thursday/ Friday [facility].   Next HD  2/17.  plts increasing, now 94. Will restart heparin if needed. 5  ESLD: gets regular paracenteses. Has had SBP before.  S/p 3.6 L paracentesis 2/14 6  Chronic hypotension: on midodrine 10 mg before HD  7. Anemia of ESRD: Epo 8000 u TIW; Hgb 9.0 2/14, Aranesp 60 mcg q week start [id number] Metabolic Bone Disease: holding binders until taking PO 9 Nutrition: albumin 4.0  Subjective:    Extubated, reports abd pain and requesting morphine.  Request specifically denied for morphine in setting of ESRD (does have fentanyl which is appropriate for ESRD).   Objective:   BP (!) 94/48 (BP Location: Right Arm)   Pulse 75   Temp 98.6 F (37 C) (Oral)   Resp 15   Ht 5\' 1"  (1.549 m)   Wt 60.1 kg (132 lb 7.9 oz)   LMP 09/21/2013   SpO2 100%   BMI 25.03 kg/m   Physical Exam: GEN: chronically ill appearing, awake and alert sitting in bed HEENT: PERRL, sclerae muddy NECK Supple, no JVD PULM: expiratory rhonchi bilaterally R > L, improved CV RRR soft systolic murmur ABD distension and fluid wave much less pronounced today, easily reducible umbilical hernia.  Hypoactive bowel sounds.  Diffusely tender EXT SCDs in place, + pitting edema to the sacrum, improved NEURO intubated and sedated  Labs: BMET  Recent Labs Lab 09/19/16 1417 09/22/16 1712 09/22/16 1715 09/23/16 0210 09/23/16 1636 09/24/16 0211 09/25/16 0120 09/26/16 0356  NA 136 135 134* 134* 137 136 137 134*  K 4.5 3.7  3.7 4.5 4.1 4.1 3.7 3.9  CL 93* 96* 91* 89* 93* 95* 95* 92*  CO2 21*  --  23 23 24 23 27 22   GLUCOSE 133* 164* 159* 105* 107* 109* 96 97  BUN 129* 53* 62* 69* 76* 81* 34* 54*  CREATININE 18.82* 12.40* 11.14* 13.29* 14.26* 14.99* 8.19* 9.98*  CALCIUM 9.5  --  9.1 8.8* 8.8* 8.6* 8.7* 8.8*  PHOS  --   --   --  7.2* 8.7* 9.7* 6.3* 8.4*   CBC  Recent Labs Lab 09/19/16 1417  09/22/16 1715  09/23/16 1636 09/24/16 0211 09/25/16 0120 09/26/16 0356  WBC 5.2  --  8.7  < > 7.1 7.3 6.4 7.6  NEUTROABS 4.2  --  7.8*  --   --   --   --   --   HGB 10.1*  < > 11.1*  < > 8.4* 9.1* 8.7* 9.6*  HCT 31.5*  < > 34.1*  < > 27.1* 29.6* 28.7* 31.0*  MCV 100.3*  --  99.1  < > 101.9* 102.1* 102.1* 101.3*  PLT 75*  --  96*  < > 61* 61* 64* 92*  < > = values in this interval not displayed.  @IMGRELPRIORS @ Medications:    . darbepoetin (ARANESP) injection - DIALYSIS  60 mcg Intravenous Q Thu-HD  . Influenza vac split quadrivalent PF  0.5 mL Intramuscular Tomorrow-1000  . lactulose  30 g  Oral TID  . mouth rinse  15 mL Mouth Rinse BID  . midodrine  10 mg Oral q morning - 10a  . piperacillin-tazobactam (ZOSYN)  IV  3.375 g Intravenous Q12H  . pneumococcal 23 valent vaccine  0.5 mL Intramuscular Tomorrow-1000  . vancomycin  750 mg Intravenous Q T,Th,Sa-HD     Madelon Lips MD [facility] pgr [phone] 09/26/2016, 8:26 AM

## 2016-09-27 DIAGNOSIS — K7460 Unspecified cirrhosis of liver: Secondary | ICD-10-CM

## 2016-09-27 DIAGNOSIS — R410 Disorientation, unspecified: Secondary | ICD-10-CM

## 2016-09-27 LAB — CULTURE, BLOOD (ROUTINE X 2)
CULTURE: NO GROWTH
Culture: NO GROWTH

## 2016-09-27 LAB — GLUCOSE, CAPILLARY
GLUCOSE-CAPILLARY: 82 mg/dL (ref 65–99)
GLUCOSE-CAPILLARY: 90 mg/dL (ref 65–99)
GLUCOSE-CAPILLARY: 95 mg/dL (ref 65–99)
GLUCOSE-CAPILLARY: 96 mg/dL (ref 65–99)
Glucose-Capillary: 77 mg/dL (ref 65–99)

## 2016-09-27 MED ORDER — OXYCODONE HCL 10 MG PO TABS
5.0000 mg | ORAL_TABLET | Freq: Three times a day (TID) | ORAL | 0 refills | Status: DC | PRN
Start: 2016-09-27 — End: 2017-03-24

## 2016-09-27 MED ORDER — IBUPROFEN 200 MG PO TABS: 200.0000 mg | ORAL_TABLET | ORAL | 0 refills | Status: DC | PRN

## 2016-09-27 MED ORDER — MIDODRINE HCL 10 MG PO TABS: 10.0000 mg | ORAL_TABLET | Freq: Every morning | ORAL | Status: DC

## 2016-09-27 MED ORDER — LACTULOSE 10 GM/15ML PO SOLN: 30.0000 g | Freq: Three times a day (TID) | ORAL | 5 refills | Status: DC

## 2016-09-27 MED ORDER — OXYCODONE HCL 10 MG PO TABS
5.0000 mg | ORAL_TABLET | Freq: Three times a day (TID) | ORAL | 0 refills | Status: DC | PRN
Start: 2016-09-27 — End: 2016-09-27

## 2016-09-27 NOTE — Discharge Instructions (Signed)
Hepatic Encephalopathy Introduction Hepatic encephalopathy is a loss of brain function from advanced liver disease. The effects of the condition depend on the type of liver damage and how severe it is. In some cases, hepatic encephalopathy can be reversed. What are the causes? The exact cause of hepatic encephalopathy is not known. What increases the risk? You have a higher risk of getting this condition if your liver is damaged. When the liver is damaged harmful substances called toxins can build up in the body. Certain toxins, such as ammonia, can harm your brain. Conditions that can cause liver damage include:  An infection.  Dehydration.  Intestinal bleeding.  Drinking too much alcohol.  Taking certain medicines, including tranquilizers, water pills (diuretics), antidepressants, or sleeping pills. What are the signs or symptoms? Signs and symptoms may develop suddenly. Or, they may develop slowly and get worse gradually. Symptoms can range from mild to severe. Mild Hepatic Encephalopathy  Mild confusion.  Personality and mood changes.  Anxiety and agitation.  Drowsiness.  Loss of mental abilities.  Musty or sweet-smelling breath. Worsening or Severe Hepatic Encephalopathy  Slowed movement.  Slurred speech.  Extreme personality changes.  Disorientation.  Abnormal shaking or flapping of the hands.  Coma. How is this diagnosed? To make a diagnosis, your health care provider will do a physical exam. To rule out other causes of your signs and symptoms, he or she may order tests. You may have:  Blood tests. These may be done to check your ammonia level, measure how long it takes your blood to clot, and check for infection.  Liver function tests. These may be done to check how well your liver is working.  MRI and CT scans. These may be done to check for a brain disorder.  Electroencephalogram (EEG). This may be done to measure the electrical activity in your  brain. How is this treated? The first step in treatment is identifying and treating possible triggers. The next step is involves taking medicine to lower the level of toxins in the body and to prevent ammonia from building up. You may need to take:  Antibiotics to reduce the ammonia-producing bacteria in your gut.  Lactulose to help flush ammonia from the gut. Follow these instructions at home: Eating and drinking  Follow a low-protein diet that includes plenty of fruits, vegetables, and whole grains, as directed by your health care provider. Ammonia is produced when you digest high-protein foods.  Work with a Microbiologist or with your health care provider to make sure you are getting the right balance of protein and minerals.  Drink enough fluids to keep your urine clear or pale yellow. Drinking plenty of water helps prevent constipation.  Do not drink alcohol or use illegal drugs. Medicines  Only take medicine as directed by your health care provider.  If you were prescribed an antibiotic medicine, finish it all even if you start to feel better.  Do not start any new medicines, including over-the-counter medicines, without first checking with your health care provider. Contact a health care provider if:  You have new symptoms.  Your symptoms change.  Your symptoms get worse.  You have a fever.  You are constipated.  You have persistent nausea, vomiting, or diarrhea. Get help right away if:  You become very confused or drowsy.  You vomit blood or material that looks like coffee grounds.  Your stool is bloody or black or looks like tar. This information is not intended to replace advice given to you by  your health care provider. Make sure you discuss any questions you have with your health care provider. Document Released: 10/06/2006 Document Revised: 01/02/2016 Document Reviewed: 03/14/2014  2017 Elsevier    Liver Failure Introduction Liver failure is a condition in  which the liver loses its ability to function due to injury or disease. The liver is a large organ in the upper right-hand side of the abdomen. It is involved in many important functions, including storing energy, producing fluids that the body needs, and removing harmful substances from the bloodstream. Liver failure can develop quickly, over days or weeks (acute liver failure). It can also develop gradually, over months or years (chronicliver failure). What are the causes? There are many possible causes of liver failure, including:  Liver infection (viral hepatitis).  Alcohol abuse.  An overdose of certain medicines. Acetaminophen overdose is a common cause.  Liver disease.  Ingesting poison from mushrooms or mold. What are the signs or symptoms? Symptoms of this condition may include:  Yellowing of the skin and the whites of the eyes (jaundice).  Bruising easily.  Persistent bleeding.  Itchy skin (pruritus).  Red, spider-like lines on the skin.  Loss of appetite.  Nausea.  Vomiting blood.  Bloody bowel movements.  Weight loss.  Muscle loss.  Jerky or floppy muscle movements, especially with hand movements (asterixis).  Fluid buildup in the belly (ascites).  Decreased urination. This may be a sign that your kidneys have stopped working (renal failure).  Confusion and sleepiness.  Difficulty sleeping.  Mood and personality changes.  Frequent infections.  Breath that smells sweet or musty.  Difficulty breathing. How is this diagnosed? This condition is diagnosed based on your symptoms, your medical history, and a physical exam. You may have tests, including:  Blood tests.  CT scan.  MRI.  Ultrasound.  Removal of a small amount of liver tissue to be examined under a microscope (biopsy). How is this treated? Treatment depends on the cause and severity of your condition. Treatment for chronic liver failure may include:  Medicines to help reduce  symptoms.  Lifestyle changes, such as limiting salt and animal proteins in your diet. Foods that contain animal proteins include red meat, fish, and dairy products. Acute or advanced (end stage) liver failure may require hospitalization. Treatment may include:  Antibiotic medicine.  IV fluids that contain sugar (glucose) and minerals (electrolytes).  Flushing out toxic substances from the body using medicine (lactulose) or a cleansing procedure (enema).  Adding certain amounts of the liquid part of blood (plasma) to your bloodstream (receiving a transfusion).  Using an artificial kidney to filter your blood (hemodialysis) if you have renal failure.  Breathing support and a breathing tube (respirator).  Liver transplant. This is a surgery to replace your liver with another persons liver (donor liver). This may be the best option if your liver has completely stopped functioning. Follow these instructions at home:  Take over-the-counter and prescription medicines only as told by your health care provider.  Do not drink alcohol.  Do not use tobacco products, including cigarettes, chewing tobacco, or e-cigarettes. If you need help quitting, ask your health care provider.  Follow instructions from your health care provider about eating and drinking restrictions. This may include:  Limiting the amount of animal protein that you eat.  Increasing the amount of plant-based protein that you eat. Foods that contain plant-based proteins include whole grains, nuts, and vegetables.  Taking vitamin supplements.  Limiting the amount of salt that you eat.  Follow  instructions from your health care provider about maintaining your vaccinations, especially vaccinations against hepatitis A and B.  Exercise regularly, as told by your health care provider.  Keep all follow-up visits as told by your health care provider. This is important. Contact a health care provider if:  You have symptoms that  get worse.  You lose a lot of weight without trying.  You have a fever or chills. Get help right away if:  You become confused or very sleepy.  You cannot take care of yourself or be taken care of at home.  You are not urinating.  You have difficulty breathing.  You vomit blood. This information is not intended to replace advice given to you by your health care provider. Make sure you discuss any questions you have with your health care provider. Document Released: 04/17/2015 Document Revised: 01/02/2016 Document Reviewed: 08/15/2014  2017 Elsevier

## 2016-09-27 NOTE — Evaluation (Signed)
Physical Therapy Evaluation Patient Details Name: Wanda [name] MRN: [id number] DOB: 11/29/1975 Today's Date: 09/27/2016   History of Present Illness  41 year old female with complicated PMH as below, which is significant for ESRD on HD (T, TH, SAT), Cirrhosis with ascites and h/o varices, and chronic hypotension. She has reportedly not been to dialysis for over one week because she was feeling "unwell and unmotivated". She did go to HD 2/13, but was very confused and combative while there. She had 2.7 L dialyzed and subsequently became unresponsive prompting EMS to be called.  Clinical Impression  Pt functioning near baseline. Pt sedentary at baseline. Pt currently functioning at min guard level. Pt reports she has someone available at all times but they work as well. Unsure of validity of support system. Acute PT to follow.    Follow Up Recommendations No PT follow up;Supervision - Intermittent    Equipment Recommendations  None recommended by PT    Recommendations for Other Services       Precautions / Restrictions Precautions Precautions: Fall Restrictions Weight Bearing Restrictions: No      Mobility  Bed Mobility Overal bed mobility: Needs Assistance Bed Mobility: Supine to Sit;Sit to Supine     Supine to sit: Supervision;HOB elevated Sit to supine: Supervision;HOB elevated   General bed mobility comments: hob elevated >45 deg, used bed rail, labored effort  Transfers Overall transfer level: Needs assistance Equipment used: Rolling walker (2 wheeled) Transfers: Sit to/from Stand Sit to Stand: Min guard         General transfer comment: v/c's for safe hand placement, increased time  Ambulation/Gait Ambulation/Gait assistance: Min guard Ambulation Distance (Feet): 60 Feet Assistive device: Rolling walker (2 wheeled) Gait Pattern/deviations: Step-through pattern;Decreased stride length Gait velocity: slow Gait velocity interpretation: Below normal speed for  age/gender General Gait Details: slow, no epsodes of LOB  Stairs            Wheelchair Mobility    Modified Rankin (Stroke Patients Only)       Balance Overall balance assessment: Needs assistance         Standing balance support: During functional activity Standing balance-Leahy Scale: Fair Standing balance comment: pt stood at sink to wash hands and put hair in pony tail without difficulty                             Pertinent Vitals/Pain Pain Assessment: No/denies pain    Home Living Family/patient expects to be discharged to:: Private residence Living Arrangements: Spouse/significant other Available Help at Discharge: Family;Available PRN/intermittently Type of Home: House Home Access: Level entry (and has a stair entry with unilateral rail)     Home Layout: One level Home Equipment: Walker - standard;Bedside commode      Prior Function Level of Independence: Independent with assistive device(s);Needs assistance   Gait / Transfers Assistance Needed: uses standard walker occasionally  ADL's / Homemaking Assistance Needed: doesn't drive or do grocery shopping  Comments: uses walker at times. on good days can do everything for herself and at others requires assist for ADLs     Hand Dominance   Dominant Hand: Right    Extremity/Trunk Assessment   Upper Extremity Assessment Upper Extremity Assessment: Generalized weakness    Lower Extremity Assessment Lower Extremity Assessment: Generalized weakness (bilat LE edema and discoloration)    Cervical / Trunk Assessment Cervical / Trunk Assessment: Normal  Communication   Communication: No difficulties  Cognition Arousal/Alertness:  Awake/alert Behavior During Therapy: Anxious;Flat affect Overall Cognitive Status: History of cognitive impairments - at baseline Area of Impairment: Problem solving             Problem Solving: Slow processing General Comments: pt extremely slow to  process and respond    General Comments      Exercises     Assessment/Plan    PT Assessment Patient needs continued PT services  PT Problem List Decreased strength;Decreased activity tolerance;Decreased balance;Decreased mobility;Decreased knowledge of use of DME          PT Treatment Interventions DME instruction;Gait training;Stair training;Functional mobility training;Therapeutic activities;Therapeutic exercise    PT Goals (Current goals can be found in the Care Plan section)  Acute Rehab PT Goals Patient Stated Goal: home today PT Goal Formulation: With patient Time For Goal Achievement: 10/04/16 Potential to Achieve Goals: Good    Frequency Min 3X/week   Barriers to discharge Decreased caregiver support unsure of available support    Co-evaluation               End of Session Equipment Utilized During Treatment: Gait belt Activity Tolerance: Patient tolerated treatment well Patient left: in bed;with bed alarm set;with call bell/phone within reach Nurse Communication: Mobility status         Time: [id number] PT Time Calculation (min) (ACUTE ONLY): 29 min   Charges:   PT Evaluation $PT Eval Moderate Complexity: 1 Procedure PT Treatments $Gait Training: 8-22 mins   PT G CodesKoleen Distance Jerrol Helmers 09/27/2016, 1:26 PM   Kittie Plater, PT, DPT Pager #: [phone] Office #: [phone]

## 2016-09-27 NOTE — Discharge Summary (Addendum)
DISCHARGE SUMMARY  Wanda [name]  MR#: [id number]  DOB:25-Jun-1976  Date of Admission: 09/22/2016 Date of Discharge: 09/27/2016  Attending Physician:Elias Bordner T  Patient's LH:[id number], [name], MD  Consults: PCCM Nephrology  Disposition: D/C home   Follow-up Appts: Follow-up Information    [name], MD. Schedule an appointment as soon as possible for a visit in 3 day(s).   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 She did not attend dialysis for over a week due to feeling "unwell and unmotivated". During her HD session on 2/13 she became confused and combative, then progressively unresponsive, and EMS was called. Patient was transferred to the ED where she was intubated for airway protection. Patient did have some tonic-clonic jerking of upper extremities as well as hypothermia and tachycardia. Patient was was subsequently transferred to our facility for further treatment.  Hospital Course: 2/13 admit w/ AMS - intubated 2/14 paracentesis yielding 3.6L 2/15 extubated 2/17 [facility] assumed care  Acute encephalopathy Multifactorial:  toxic metabolic enceph due to missed HD, overuse of sedating medications, hepatic encephalopathy - caution with all sedating meds - continue lactulose - CT head unremarkable - pt advised to use potentially sedating medications in  extreme moderation and w/ great caution   Acute hypoxic respiratory failure Due to AMS - resolved - on RA w/ sats in upper 90s  Possible sepsis peritoneal fluid w/o growth - blood cx no growth - trach aspirate w/ oral flora - MRSA screen negative - afebrile w/ normal WBC - no compelling clinical evidence of infection - stopped abx (completed ~5 days of Zosyn)  Chronic hypotension Cont midodrine - BP appears to be stable at her baseline   Chronic Anemia & thrombocytopenia No signs of active bleeding - Hgb and plt count improving at time of d/c   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 Receives regular paracentesis - abdom not taut at present   End-stage renal disease on hemodialysis Nephrology following  Chronic pain Low dose prn oxycodone - DO NOT ADJUST DOSE UPWARD FURTHER - DO NOT USE IV NARCOTICS   History of anxiety Holding home Ativan and advised against its use following d/c   Allergies as of 09/27/2016      Reactions   Lasix [furosemide] Other (See Comments)   "doesn't work"   Latex Itching      Medication List    STOP taking these medications   clotrimazole 1 % cream Commonly known as:  LOTRIMIN   LORazepam 1 MG tablet Commonly known as:  ATIVAN   oxyCODONE-acetaminophen 5-325 MG tablet Commonly known as:  PERCOCET/ROXICET   sucralfate 1 GM/10ML suspension Commonly known as:  CARAFATE     TAKE these medications   Darbepoetin Alfa 60 MCG/0.3ML Sosy injection Commonly known as:  ARANESP Inject 0.3 mLs (60 mcg total) into the vein every Thursday with hemodialysis. What changed:  when to take this   ibuprofen 200 MG tablet Commonly known as:  ADVIL,MOTRIN Take 1-2 tablets (200-400  mg total) by mouth every 4 (four) hours as needed for fever, mild pain or moderate pain.   lactulose 10 GM/15ML solution Commonly known as:  CHRONULAC Take 45 mLs (30 g total) by mouth 3 (three) times daily. TAKE 15-30 ml BY MOUTH three times a day What  changed:  how much to take  how to take this  when to take this   lidocaine 2 % solution Commonly known as:  XYLOCAINE TAKE TWO TEASPOONSFUL (10ML) BY MOUTH BEFORE MEALS AND AT BEDTIME TO PREVENT CHEST PAIN WHILE EATING   lidocaine-prilocaine cream Commonly known as:  EMLA Apply 1 application topically as needed (for dialysis treatments).   midodrine 10 MG tablet Commonly known as:  PROAMATINE Take 1 tablet (10 mg total) by mouth every morning. Start taking on:  09/28/2016 What changed:  when to take this  additional instructions  Another medication with the same name was removed. Continue taking this medication, and follow the directions you see here.   Oxycodone HCl 10 MG Tabs Take 0.5 tablets (5 mg total) by mouth 3 (three) times daily as needed. 1 PO TID AS NEEDED FOR PAIN What changed:  how much to take  how to take this  when to take this  reasons to take this   RENVELA 800 MG tablet Generic drug:  sevelamer carbonate Take 1600 mg by mouth 3 times daily with meals. Take 800 mg by mouth with snacks.       Day of Discharge BP (!) 97/44 (BP Location: Left Arm)   Pulse 70   Temp 98.8 F (37.1 C) (Oral)   Resp 17   Ht 5\' 1"  (1.549 m)   Wt 60.7 kg (133 lb 13.1 oz)   LMP 09/21/2013   SpO2 97%   BMI 25.28 kg/m   Physical Exam: General: No acute respiratory distress Lungs: Clear to auscultation bilaterally without wheezes or crackles Cardiovascular: Regular rate and rhythm without murmur  Abdomen: Nontender, nondistended, soft, bowel sounds positive, no rebound, no ascites, no appreciable mass Extremities: No significant edema bilateral lower extremities  Basic Metabolic Panel:  Recent Labs Lab 09/23/16 0210 09/23/16 1636 09/24/16 0211 09/25/16 0120 09/26/16 0356  NA 134* 137 136 137 134*  K 4.5 4.1 4.1 3.7 3.9  CL 89* 93* 95* 95* 92*  CO2 23 24 23 27 22   GLUCOSE 105* 107* 109* 96 97  BUN 69* 76* 81* 34* 54*  CREATININE 13.29* 14.26*  14.99* 8.19* 9.98*  CALCIUM 8.8* 8.8* 8.6* 8.7* 8.8*  MG 2.8*  --  3.1* 2.3 2.4  PHOS 7.2* 8.7* 9.7* 6.3* 8.4*    Liver Function Tests:  Recent Labs Lab 09/22/16 1715 09/23/16 1636 09/26/16 0356  AST 61*  --   --   ALT 32  --   --   ALKPHOS 127*  --   --   BILITOT 1.6*  --   --   PROT 8.7*  --   --   ALBUMIN 4.0 2.8* 2.9*    Recent Labs Lab 09/22/16 1758 09/25/16 0120  AMMONIA 78* 31    Coags:  Recent Labs Lab 09/22/16 1715  INR 1.04   CBC:  Recent Labs Lab 09/22/16 1715 09/23/16 0210 09/23/16 1636 09/24/16 0211 09/25/16 0120 09/26/16 0356  WBC 8.7 7.4 7.1 7.3 6.4 7.6  NEUTROABS 7.8*  --   --   --   --   --   HGB 11.1* 9.0* 8.4* 9.1* 8.7* 9.6*  HCT 34.1* 29.0* 27.1* 29.6* 28.7* 31.0*  MCV 99.1 100.7* 101.9* 102.1* 102.1* 101.3*  PLT 96* 64* 61* 61* 64* 92*    CBG:  Recent Labs Lab 09/26/16 1944 09/27/16 0041 09/27/16 0332 09/27/16 0813 09/27/16 1256  GLUCAP 123* 82 95 90 96    Recent Results (from the past 240 hour(s))  Blood culture (routine x 2)     Status: None   Collection Time: 09/22/16  5:50 PM  Result Value Ref Range Status   Specimen Description BLOOD LEFT HAND  Final   Special Requests BOTTLES DRAWN AEROBIC AND ANAEROBIC 6CC EACH  Final   Culture NO GROWTH 5 DAYS  Final   Report Status 09/27/2016 FINAL  Final  Blood culture (routine x 2)     Status: None   Collection Time: 09/22/16  5:56 PM  Result Value Ref Range Status   Specimen Description BLOOD LEFT WRIST  Final   Special Requests BOTTLES DRAWN AEROBIC ONLY [place]  Final   Culture NO GROWTH 5 DAYS  Final   Report Status 09/27/2016 FINAL  Final  MRSA PCR Screening     Status: None   Collection Time: 09/22/16 11:01 PM  Result Value Ref Range Status   MRSA by PCR NEGATIVE NEGATIVE Final    Comment:        The GeneXpert MRSA Assay (FDA approved for NASAL specimens only), is one component of a comprehensive MRSA colonization surveillance program. It is not intended  to diagnose MRSA infection nor to guide or monitor treatment for MRSA infections.   Culture, respiratory (tracheal aspirate)     Status: None   Collection Time: 09/23/16  6:46 AM  Result Value Ref Range Status   Specimen Description TRACHEAL ASPIRATE  Final   Special Requests NONE  Final   Gram Stain   Final    RARE WBC PRESENT,BOTH PMN AND MONONUCLEAR FEW GRAM NEGATIVE COCCOBACILLI RARE GRAM POSITIVE COCCI IN PAIRS    Culture Consistent with normal respiratory flora.  Final   Report Status 09/25/2016 FINAL  Final  Urine culture     Status: None   Collection Time: 09/23/16  8:05 AM  Result Value Ref Range Status   Specimen Description URINE, CATHETERIZED  Final   Special Requests NONE  Final   Culture NO GROWTH  Final   Report Status 09/24/2016 FINAL  Final  Culture, body fluid-bottle     Status: None (Preliminary result)   Collection Time: 09/23/16  4:01 PM  Result Value Ref Range Status   Specimen Description FLUID PERITONEAL  Final   Special Requests BOTTLES DRAWN AEROBIC ONLY 6CC  Final   Culture NO GROWTH 4 DAYS  Final   Report Status PENDING  Incomplete  Gram stain     Status: None   Collection Time: 09/23/16  4:01 PM  Result Value Ref Range Status   Specimen Description FLUID PERITONEAL  Final   Special Requests NONE  Final   Gram Stain   Final    WBC PRESENT,BOTH PMN AND MONONUCLEAR NO ORGANISMS SEEN CYTOSPIN    Report Status 09/23/2016 FINAL  Final     Time spent in discharge (includes decision making & examination of pt): <30 minutes  09/27/2016, 4:44 PM   Cherene Altes, MD [facility] Office  [phone] Pager [phone]  On-Call/Text Page:      [email or web address]      password [facility]

## 2016-09-27 NOTE — Progress Notes (Signed)
  [facility] Progress Note   Assessment/ Plan:   1  Acute encephalopathy: CT negative, appears that all her cultures are negative ? Oversedation. This is resolved. 2. Possible sepsis, resolved: sputum culture with + Gm neg coccobacilli and rare GPCs in pairs consistent with normal respiratory flora, UA dirty, culture no growth though. Peritoneal fluid with PMNs on gm stain, culture NGTD.  Abx have been discontinued. 3. Acute hypoxic respiratory failure: resolved.  Extubated 4  ESRD: Tuesday/ Thursday/ Friday [facility]   Next HD  2/20.  plts increasing, now 94. Will restart heparin if needed. 5  ESLD: gets regular paracenteses. Has had SBP before.  S/p 3.6 L paracentesis 2/14 6  Chronic hypotension: on midodrine 10 mg before HD  7. Anemia of ESRD: Epo 8000 u TIW; Hgb 9.0 2/14, Aranesp 60 mcg q week start [id number] Metabolic Bone Disease: holding binders until taking PO 9 Nutrition: albumin 4.0 10: Dispo: from our perspective OK to go.  Subjective:    Still wants more pain meds.  Request denied.   Objective:   BP (!) 96/41   Pulse 79   Temp 98.8 F (37.1 C) (Oral)   Resp 12   Ht 5\' 1"  (1.549 m)   Wt 60.7 kg (133 lb 13.1 oz)   LMP 09/21/2013   SpO2 97%   BMI 25.28 kg/m   Physical Exam: GEN: chronically ill appearing, awake and alert sitting in bed HEENT: PERRL, sclerae muddy NECK Supple, no JVD PULM: expiratory rhonchi bilaterally R > L, improved CV RRR soft systolic murmur ABD distension and fluid wave much less pronounced today, easily reducible umbilical hernia.  Hypoactive bowel sounds.  Diffusely tender EXT SCDs in place, + pitting edema to the sacrum, improved NEURO AAO x 3  Labs: BMET  Recent Labs Lab 09/22/16 1712 09/22/16 1715 09/23/16 0210 09/23/16 1636 09/24/16 0211 09/25/16 0120 09/26/16 0356  NA 135 134* 134* 137 136 137 134*  K 3.7 3.7 4.5 4.1 4.1 3.7 3.9  CL 96* 91* 89* 93* 95* 95* 92*  CO2  --  23 23 24 23 27 22   GLUCOSE 164* 159*  105* 107* 109* 96 97  BUN 53* 62* 69* 76* 81* 34* 54*  CREATININE 12.40* 11.14* 13.29* 14.26* 14.99* 8.19* 9.98*  CALCIUM  --  9.1 8.8* 8.8* 8.6* 8.7* 8.8*  PHOS  --   --  7.2* 8.7* 9.7* 6.3* 8.4*   CBC  Recent Labs Lab 09/22/16 1715  09/23/16 1636 09/24/16 0211 09/25/16 0120 09/26/16 0356  WBC 8.7  < > 7.1 7.3 6.4 7.6  NEUTROABS 7.8*  --   --   --   --   --   HGB 11.1*  < > 8.4* 9.1* 8.7* 9.6*  HCT 34.1*  < > 27.1* 29.6* 28.7* 31.0*  MCV 99.1  < > 101.9* 102.1* 102.1* 101.3*  PLT 96*  < > 61* 61* 64* 92*  < > = values in this interval not displayed.  @IMGRELPRIORS @ Medications:    . darbepoetin (ARANESP) injection - DIALYSIS  60 mcg Intravenous Q Thu-HD  . lactulose  30 g Oral TID  . mouth rinse  15 mL Mouth Rinse BID  . midodrine  10 mg Oral q morning - 10a     Madelon Lips MD [facility] pgr [phone] 09/27/2016, 9:00 AM

## 2016-09-28 LAB — CULTURE, BODY FLUID-BOTTLE: CULTURE: NO GROWTH

## 2016-09-28 LAB — CULTURE, BODY FLUID W GRAM STAIN -BOTTLE

## 2016-09-30 ENCOUNTER — Encounter: Payer: Self-pay | Admitting: Gastroenterology

## 2016-09-30 ENCOUNTER — Ambulatory Visit (INDEPENDENT_AMBULATORY_CARE_PROVIDER_SITE_OTHER): Payer: Medicare Other | Admitting: Gastroenterology

## 2016-09-30 DIAGNOSIS — K7460 Unspecified cirrhosis of liver: Secondary | ICD-10-CM

## 2016-09-30 DIAGNOSIS — K7581 Nonalcoholic steatohepatitis (NASH): Secondary | ICD-10-CM | POA: Diagnosis not present

## 2016-09-30 DIAGNOSIS — K922 Gastrointestinal hemorrhage, unspecified: Secondary | ICD-10-CM | POA: Diagnosis not present

## 2016-09-30 DIAGNOSIS — I8500 Esophageal varices without bleeding: Secondary | ICD-10-CM | POA: Diagnosis not present

## 2016-09-30 MED ORDER — OMEPRAZOLE 20 MG PO CPDR: DELAYED_RELEASE_CAPSULE | ORAL | 11 refills | Status: AC

## 2016-09-30 MED ORDER — CLOTRIMAZOLE 1 % EX CREA: 1.0000 "application " | TOPICAL_CREAM | Freq: Three times a day (TID) | CUTANEOUS | 3 refills | Status: DC

## 2016-09-30 NOTE — Assessment & Plan Note (Signed)
LAST EGD OCT 2017: ESOPHAGEAL ULCER. NO LONGER HAVING BLOODY EMESIS AND BLOOD COUNT IS STABLE. NO BRBPR OR MELENA.  CONTINUE TO MONITOR SYMPTOMS. CONSIDER EGD IF PT HAS PERSISTENT UIGB.

## 2016-09-30 NOTE — Progress Notes (Signed)
ON RECALL  °

## 2016-09-30 NOTE — Patient Instructions (Signed)
Please call for a refill when your last oxycodone prescription is filled.  CONTINUE LACTULOSE.   FOLLOW UP IN 6 MOS.

## 2016-09-30 NOTE — Assessment & Plan Note (Signed)
PARTIALL YCOMPENSATED LIVER DISEASE.  Please call for a refill when your last oxycodone prescription is filled. CONTINUE LACTULOSE.  PT DOES NOT PREFER XIFAXAN DUE TO INABILITY TO CONTROL BOWELS. REVIEWED EGDs FROM 2017. NO INDICATION FOR ENDOSCOPY AT THIS TIME. FOLLOW UP IN 6 MOS.

## 2016-09-30 NOTE — Assessment & Plan Note (Signed)
LAST EGD GRADE 1 VARICES.  CONTINUE TO MONITOR SYMPTOMS.

## 2016-09-30 NOTE — Progress Notes (Signed)
Subjective:    Patient ID: Wanda [name], female    DOB: 1976-08-07, 41 y.o.   MRN: [id number]  [name], MD  HPI Calcium binders cause constipation. STOOL BETTER CONTROLLED WITH LACTULOSE. WITH XIFAXAN HARD TO TELL WHEN SHE NEEDS TO HAVE A BM. REPORTS SHE WAS THROWING UP BLOOD BUT NOT NOW. WAS HAVING SOFT STOOL. NOW BOWELS ARE GETTING BACK TO NORMAL(?#7).BOTTOM DOESN'T FEEL NORMAL. IT'S GETTING BETTER BUT ITCHING. BURNING WITH URINATION AND PRESSURE. NO HEMATURIA. FEEL LIKE SHE HAS TO URINATE ALL THE TIME. FEELS HOT AND COLD. LAST VOMITED: LAST WEEK. ABDOMINAL PAIN WHEN SHE WAS IN HOSPITAL AND NOW BETTER. DISCHARGE FEB 14 FROM [facility]. NEEDS REFILL FOR BELLY CREAM. SOME INDIGESTION: NOT REALLY UNLESS SHE DRINKS WATER. HAVING BM: ?X/DAY. CRAMPS WITH DIALYSIS.   PT DENIES FEVER, CHILLS, HEMATOCHEZIA, nausea, melena, diarrhea, CHEST PAIN, SHORTNESS OF BREATH, CHANGE IN BOWEL IN HABITS, constipation, problems swallowing, OR heartburn.  Past Medical History:  Diagnosis Date  . Acute blood loss anemia 02/25/2014   Status post transfusion  . Acute renal failure ([place]) 09/2013   Pre-renal- resolved  . Anasarca 10/10/2013  . Anxiety   . Bipolar disorder ([place]) 12/04/2013   2007-SEEN IN ED FOR INVOLUNTARY COMMITMENT, UDS POS FOR AMPHETAMINES/OPIATES   . Bleeding esophageal varices ([place]) 02/28/2014   s/p banding  . C. difficile colitis 04/19/2014  . Chronic hypotension   . Cirrhosis ([place]) 10/05/13   Liver bx 11/23/13 (delayed initially due to patient refusal). c/w steatohepatitis  . Cirrhosis of liver with ascites ([place])   . Depression   . ESRD (end stage renal disease) on dialysis ([place]) 08/2014  . Folate deficiency 09/2013  . Gastroesophageal junction ulcer 09/17/2014  . GERD (gastroesophageal reflux disease)   . Hematemesis/vomiting blood 02/24/2014  . Macrocytosis 02/28/2014  . PNA (pneumonia) 10/13/2013  . SBP (spontaneous bacterial peritonitis) ([place]) 11/10/2013  . Thrombocytopenia ([place])    Hypercellular bone marrow; abundant megakaryocytes per 08/27/2014; s/p bone marrow bx    Past Surgical History:  Procedure Laterality Date  . AV FISTULA PLACEMENT Right 11/16/2014   Procedure: Right arm Creation of arteriovenous fistula;  Surgeon: Angelia Mould, MD;  Location: [facility];  Service: Vascular;  Laterality: Right;  . CENTRAL VENOUS CATHETER INSERTION Right   . COLONOSCOPY N/A 12/19/2013   SLF:NO OBVIOUS SOURCE FOR ANEMIA IDETIFIED/ONE COLON POLYP REMOVED/Small internal hemorrhoids  . ESOPHAGEAL BANDING  07/04/2014   Procedure: ESOPHAGEAL BANDING;  Surgeon: Daneil Dolin, MD;  [facility];  Service: Endoscopy;;  . ESOPHAGEAL BANDING N/A 07/24/2014   Procedure: ESOPHAGEAL BANDING (2 bands applied);  Surgeon: Danie Binder, MD;  Location: [facility];  Service: Endoscopy;  Laterality: N/A;  . ESOPHAGEAL BANDING N/A 07/23/2015   Procedure: ESOPHAGEAL BANDING;  Surgeon: Danie Binder, MD;  [facility];  Service: Endoscopy;  Laterality: N/A;  . ESOPHAGEAL BANDING N/A 03/04/2016   Procedure: ESOPHAGEAL BANDING;  Surgeon: Danie Binder, MD;  [facility];  Service: Endoscopy;  Laterality: N/A;  . ESOPHAGEAL BANDING N/A 04/30/2016   Procedure: ESOPHAGEAL BANDING;  Surgeon: Daneil Dolin, MD;  [facility];  Service: Endoscopy;  Laterality: N/A;  . ESOPHAGOGASTRODUODENOSCOPY N/A 11/14/2013   SLF:1 column of very small varices in distal esopahgus/MODERATE PORTAL GASTROPATHY IN PROXIMAL STOMACH/MODERATE erosive gastritis  . ESOPHAGOGASTRODUODENOSCOPY N/A 02/11/2014   Dr. Rourk:Esophageal varices with bleeding stigmata-status post esophageal band ligation therapy. Portal gastropathy  . ESOPHAGOGASTRODUODENOSCOPY N/A 07/04/2014   RMR: Persiting grade 2  esophageal varicies with bleeding stigmata status post band ligation. Significantly congested gastric mucosa iwith changes constistant with protal gastropathy.   . ESOPHAGOGASTRODUODENOSCOPY N/A 09/16/2014    Rehman: Single short column of varix proximal to GE junction not large enough to be banded. Two amall ulcers at the GEJ felt to be source of GI Bleeding but no active bleeding but no actibe bleeding noted. No therapy rendered. Portal gastropathy NO evidence of peptic ulcer diease or gastric varices.   . ESOPHAGOGASTRODUODENOSCOPY N/A 12/14/2014   SLF: Grade ! esophageal varices. 2. Moderate Portal Gastropathy  . ESOPHAGOGASTRODUODENOSCOPY N/A 03/27/2015   Procedure: ESOPHAGOGASTRODUODENOSCOPY (EGD);  Surgeon: Danie Binder, MD;  [facility];  Service: Endoscopy;  Laterality: N/A;  1045am - moved to 817 @ 11:30  . ESOPHAGOGASTRODUODENOSCOPY N/A 06/05/2015   Procedure: ESOPHAGOGASTRODUODENOSCOPY (EGD);  Surgeon: Clarene Essex, MD;  Location: [facility] ENDOSCOPY;  Service: Endoscopy;  Laterality: N/A;  . ESOPHAGOGASTRODUODENOSCOPY N/A 07/23/2015   SLF:1. Grade 1 esophageal varices 2. Moderate portal hypertensive gastropathy 3. MILd non-erosive gastritis.   [name] ESOPHAGOGASTRODUODENOSCOPY N/A 03/04/2016   Dr. Oneida Alar: grade 1 varices, surveillance in Jan 2017   . ESOPHAGOGASTRODUODENOSCOPY N/A 05/21/2016   Procedure: ESOPHAGOGASTRODUODENOSCOPY (EGD);  Surgeon: Gatha Mayer, MD;  Location: [facility] ENDOSCOPY;  Service: Endoscopy;  Laterality: N/A;  . ESOPHAGOGASTRODUODENOSCOPY (EGD) WITH PROPOFOL N/A 07/24/2014   SLF:  1. 2 columns grade 2-3 varices- 2 Bands applied.  2.  Moderate gastropathy 3. Duodenal Diverticula  . ESOPHAGOGASTRODUODENOSCOPY (EGD) WITH PROPOFOL N/A 10/26/2014   RMR: 2 columns of grade 2 esophageal varices without obvious bleeding stigmata status post band ligation to complet obliteration of remaining varices.   . ESOPHAGOGASTRODUODENOSCOPY (EGD) WITH PROPOFOL N/A 04/30/2016   Procedure: ESOPHAGOGASTRODUODENOSCOPY (EGD) WITH PROPOFOL;  Surgeon: Daneil Dolin, MD;  [facility];  Service: Endoscopy;  Laterality: N/A;  . None    . PARACENTESIS  Feb 2015   1180 fluid, negative  fluid analysis.   [name] PARACENTESIS  10/2013    Allergies  Allergen Reactions  . Lasix [Furosemide] Other (See Comments)    "doesn't work"  . Latex Itching    Current Outpatient Prescriptions  Medication Sig Dispense Refill  . Darbepoetin Alfa (ARANESP) 60 MCG/0.3ML SOSY injection Inject 0.3 mLs (60 mcg total) into the vein every Thursday with hemodialysis. (Patient taking differently: Inject 60 mcg into the vein every Tuesday with hemodialysis. )    . ibuprofen (ADVIL,MOTRIN) 200 MG tablet Take 1-2 tablets (200-400 mg total) by mouth every 4 (four) hours as needed for fever, mild pain or moderate pain.    [name] lactulose (CHRONULAC) 10 GM/15ML solution Take 45 mLs (30 g total) by mouth 3 (three) times daily. TAKE 15-30 ml BY MOUTH three times a day    . lidocaine (XYLOCAINE) 2 % solution TAKE TWO TEASPOONSFUL (10ML) BY MOUTH BEFORE MEALS AND AT BEDTIME TO PREVENT CHEST PAIN WHILE EATING    . lidocaine-prilocaine (EMLA) cream Apply 1 application topically as needed (for dialysis treatments).    . midodrine (PROAMATINE) 10 MG tablet Take 1 tablet (10 mg total) by mouth every morning.    . Oxycodone HCl 10 MG TABS Take 0.5 tablets (5 mg total) by mouth 3 (three) times daily as needed. 1 PO TID AS NEEDED FOR PAIN    . RENVELA 800 MG tablet Take 1600 mg by mouth 3 times daily with meals. Take 800 mg by mouth with snacks.     Review of Systems PER HPI OTHERWISE ALL SYSTEMS ARE  NEGATIVE.    Objective:   Physical Exam  Constitutional: She is oriented to person, place, and time. She appears well-developed and well-nourished. No distress.  HENT:  Head: Normocephalic and atraumatic.  Mouth/Throat: Oropharynx is clear and moist. No oropharyngeal exudate.  Eyes: Pupils are equal, round, and reactive to light. No scleral icterus.  Neck: Normal range of motion. Neck supple.  Cardiovascular: Normal rate, regular rhythm and normal heart sounds.   Pulmonary/Chest: Effort normal and breath sounds normal. No  respiratory distress.  Abdominal: Soft. Bowel sounds are normal. She exhibits no distension. There is tenderness. There is no rebound and no guarding.  MILDLY DISTENDED AT UMBILICUS, MILDLY TENDER @UMBILICUS , NO ERYTHEMA  Musculoskeletal: She exhibits no edema.  Lymphadenopathy:    She has no cervical adenopathy.  Neurological: She is alert and oriented to person, place, and time.  NO  NEW FOCAL DEFICITS  Psychiatric: She has a normal mood and affect.  Vitals reviewed.    Assessment & Plan:

## 2016-10-01 NOTE — Progress Notes (Signed)
ON RECALL  °

## 2016-10-01 NOTE — Progress Notes (Signed)
cc'ed to pcp °

## 2016-10-06 IMAGING — US US PARACENTESIS
1 series · 2 of 2 positions shown · non-contrast
Comparison: none

INDICATION: Cirrhosis with ascites.

[Series 1: us paracentesis · 0.19mm/px · 2 of 2 slices shown]
[im 1/2]
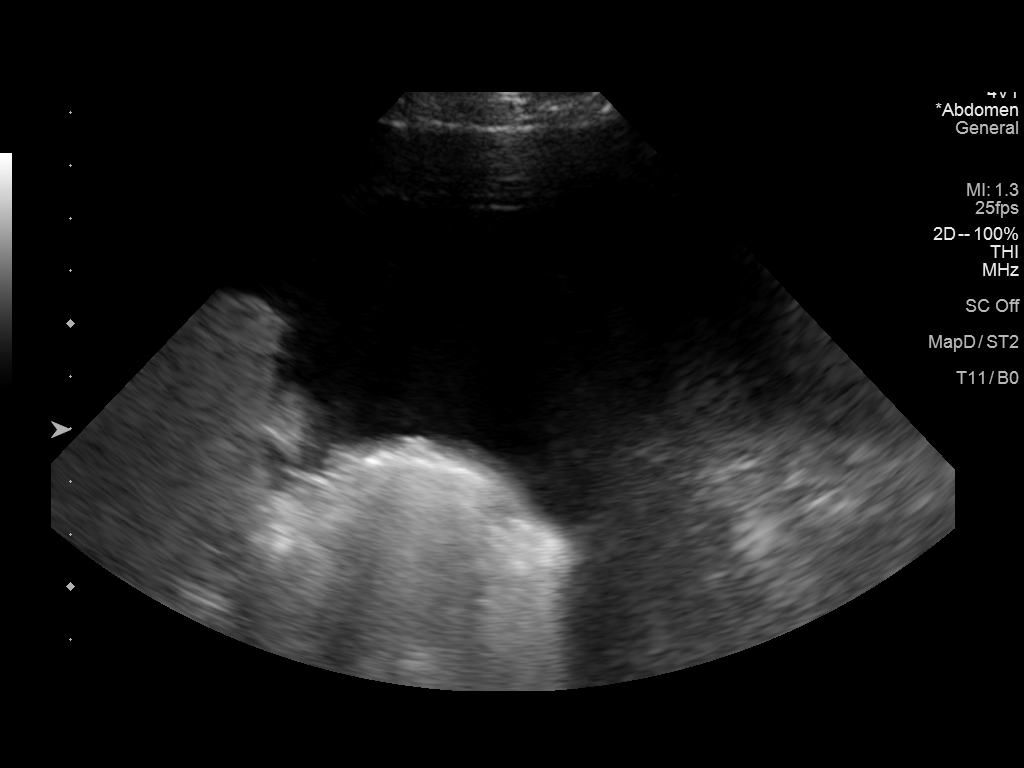
[im 2/2]
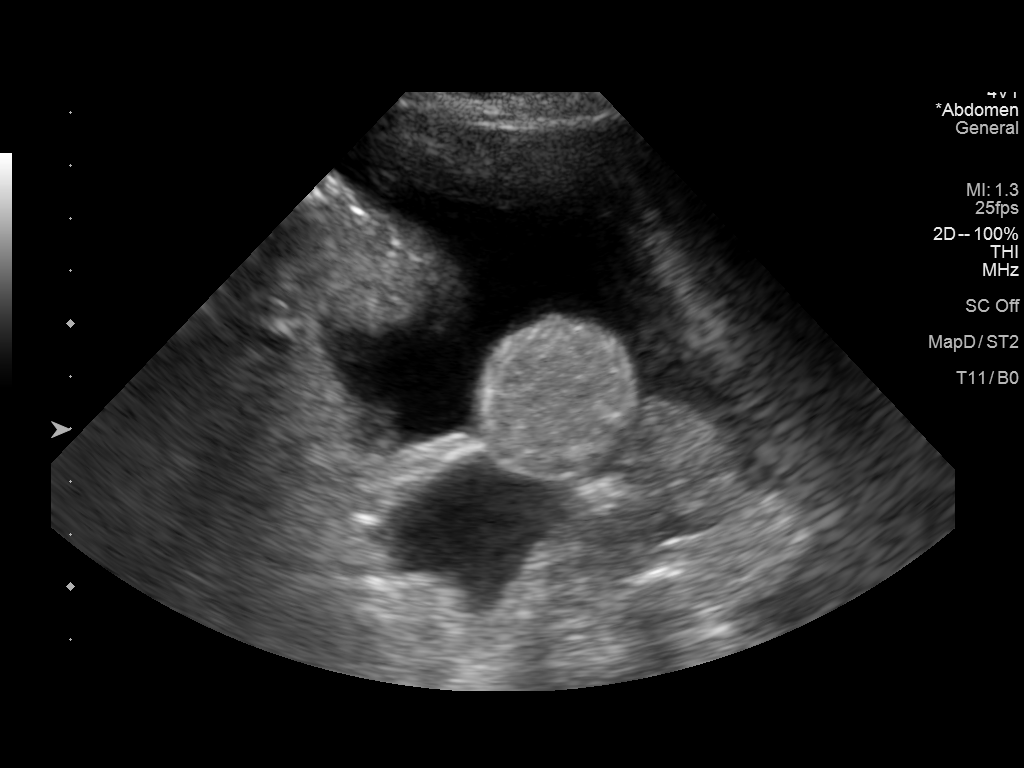

[2 of 2 positions shown; findings below may reference images not displayed]

EXAM:
ULTRASOUND GUIDED  PARACENTESIS

MEDICATIONS:
None.

COMPLICATIONS:
None immediate.

PROCEDURE:
Informed written consent was obtained from the patient after a
discussion of the risks, benefits and alternatives to treatment. A
timeout was performed prior to the initiation of the procedure.

Initial ultrasound scanning demonstrates a large amount of ascites
within the right lower abdominal quadrant. The right lower abdomen
was prepped and draped in the usual sterile fashion. 1% lidocaine
with epinephrine was used for local anesthesia.

Following this, a Yueh catheter was introduced. An ultrasound image
was saved for documentation purposes. The paracentesis was
performed. The catheter was removed and a dressing was applied. The
patient tolerated the procedure well without immediate post
procedural complication.
FINDINGS: A total of approximately 2444 mL of clear yellow fluid was removed.
IMPRESSION: Successful ultrasound-guided paracentesis yielding 2444 mL of
peritoneal fluid.

## 2016-10-07 ENCOUNTER — Emergency Department (HOSPITAL_COMMUNITY): Payer: Medicare Other

## 2016-10-07 ENCOUNTER — Inpatient Hospital Stay (HOSPITAL_COMMUNITY): Payer: Medicare Other

## 2016-10-07 ENCOUNTER — Inpatient Hospital Stay (HOSPITAL_COMMUNITY)
Admission: EM | Admit: 2016-10-07 | Discharge: 2016-10-10 | DRG: 441 | Discharge status: Against Medical Advice | Payer: Medicare Other | Source: Ambulatory Visit | Attending: Family Medicine | Admitting: Family Medicine

## 2016-10-07 ENCOUNTER — Encounter (HOSPITAL_COMMUNITY): Payer: Self-pay

## 2016-10-07 DIAGNOSIS — I429 Cardiomyopathy, unspecified: Secondary | ICD-10-CM

## 2016-10-07 DIAGNOSIS — K7460 Unspecified cirrhosis of liver: Secondary | ICD-10-CM | POA: Diagnosis present

## 2016-10-07 DIAGNOSIS — G9340 Encephalopathy, unspecified: Secondary | ICD-10-CM

## 2016-10-07 DIAGNOSIS — F319 Bipolar disorder, unspecified: Secondary | ICD-10-CM | POA: Diagnosis present

## 2016-10-07 DIAGNOSIS — R627 Adult failure to thrive: Secondary | ICD-10-CM | POA: Diagnosis present

## 2016-10-07 DIAGNOSIS — J988 Other specified respiratory disorders: Secondary | ICD-10-CM | POA: Diagnosis not present

## 2016-10-07 DIAGNOSIS — N2581 Secondary hyperparathyroidism of renal origin: Secondary | ICD-10-CM | POA: Diagnosis present

## 2016-10-07 DIAGNOSIS — R55 Syncope and collapse: Secondary | ICD-10-CM

## 2016-10-07 DIAGNOSIS — I248 Other forms of acute ischemic heart disease: Secondary | ICD-10-CM | POA: Diagnosis present

## 2016-10-07 DIAGNOSIS — I9589 Other hypotension: Secondary | ICD-10-CM | POA: Diagnosis present

## 2016-10-07 DIAGNOSIS — Z992 Dependence on renal dialysis: Secondary | ICD-10-CM | POA: Diagnosis not present

## 2016-10-07 DIAGNOSIS — Z515 Encounter for palliative care: Secondary | ICD-10-CM

## 2016-10-07 DIAGNOSIS — R569 Unspecified convulsions: Secondary | ICD-10-CM | POA: Diagnosis not present

## 2016-10-07 DIAGNOSIS — Z9119 Patient's noncompliance with other medical treatment and regimen: Secondary | ICD-10-CM

## 2016-10-07 DIAGNOSIS — R188 Other ascites: Secondary | ICD-10-CM | POA: Diagnosis present

## 2016-10-07 DIAGNOSIS — R7989 Other specified abnormal findings of blood chemistry: Secondary | ICD-10-CM

## 2016-10-07 DIAGNOSIS — K7290 Hepatic failure, unspecified without coma: Principal | ICD-10-CM | POA: Diagnosis present

## 2016-10-07 DIAGNOSIS — I120 Hypertensive chronic kidney disease with stage 5 chronic kidney disease or end stage renal disease: Secondary | ICD-10-CM | POA: Diagnosis present

## 2016-10-07 DIAGNOSIS — K7581 Nonalcoholic steatohepatitis (NASH): Secondary | ICD-10-CM | POA: Diagnosis present

## 2016-10-07 DIAGNOSIS — E1122 Type 2 diabetes mellitus with diabetic chronic kidney disease: Secondary | ICD-10-CM | POA: Diagnosis present

## 2016-10-07 DIAGNOSIS — Z8249 Family history of ischemic heart disease and other diseases of the circulatory system: Secondary | ICD-10-CM | POA: Diagnosis not present

## 2016-10-07 DIAGNOSIS — J9600 Acute respiratory failure, unspecified whether with hypoxia or hypercapnia: Secondary | ICD-10-CM

## 2016-10-07 DIAGNOSIS — J9690 Respiratory failure, unspecified, unspecified whether with hypoxia or hypercapnia: Secondary | ICD-10-CM

## 2016-10-07 DIAGNOSIS — E872 Acidosis: Secondary | ICD-10-CM | POA: Diagnosis present

## 2016-10-07 DIAGNOSIS — F419 Anxiety disorder, unspecified: Secondary | ICD-10-CM | POA: Diagnosis present

## 2016-10-07 DIAGNOSIS — D649 Anemia, unspecified: Secondary | ICD-10-CM | POA: Diagnosis present

## 2016-10-07 DIAGNOSIS — Z87891 Personal history of nicotine dependence: Secondary | ICD-10-CM

## 2016-10-07 DIAGNOSIS — Z7189 Other specified counseling: Secondary | ICD-10-CM | POA: Diagnosis not present

## 2016-10-07 DIAGNOSIS — R748 Abnormal levels of other serum enzymes: Secondary | ICD-10-CM | POA: Diagnosis not present

## 2016-10-07 DIAGNOSIS — Z9911 Dependence on respirator [ventilator] status: Secondary | ICD-10-CM

## 2016-10-07 DIAGNOSIS — E873 Alkalosis: Secondary | ICD-10-CM | POA: Diagnosis present

## 2016-10-07 DIAGNOSIS — Z794 Long term (current) use of insulin: Secondary | ICD-10-CM

## 2016-10-07 DIAGNOSIS — R7889 Finding of other specified substances, not normally found in blood: Secondary | ICD-10-CM | POA: Diagnosis not present

## 2016-10-07 DIAGNOSIS — N186 End stage renal disease: Secondary | ICD-10-CM | POA: Diagnosis present

## 2016-10-07 DIAGNOSIS — K219 Gastro-esophageal reflux disease without esophagitis: Secondary | ICD-10-CM | POA: Diagnosis present

## 2016-10-07 DIAGNOSIS — R778 Other specified abnormalities of plasma proteins: Secondary | ICD-10-CM | POA: Diagnosis present

## 2016-10-07 DIAGNOSIS — E1165 Type 2 diabetes mellitus with hyperglycemia: Secondary | ICD-10-CM | POA: Diagnosis present

## 2016-10-07 DIAGNOSIS — J9602 Acute respiratory failure with hypercapnia: Secondary | ICD-10-CM | POA: Diagnosis not present

## 2016-10-07 DIAGNOSIS — I255 Ischemic cardiomyopathy: Secondary | ICD-10-CM | POA: Diagnosis not present

## 2016-10-07 DIAGNOSIS — D696 Thrombocytopenia, unspecified: Secondary | ICD-10-CM | POA: Diagnosis present

## 2016-10-07 DIAGNOSIS — K429 Umbilical hernia without obstruction or gangrene: Secondary | ICD-10-CM | POA: Diagnosis present

## 2016-10-07 DIAGNOSIS — R579 Shock, unspecified: Secondary | ICD-10-CM | POA: Diagnosis present

## 2016-10-07 LAB — COMPREHENSIVE METABOLIC PANEL
ALBUMIN: 3.8 g/dL (ref 3.5–5.0)
ALT: 31 U/L (ref 14–54)
ANION GAP: 18 — AB (ref 5–15)
AST: 68 U/L — ABNORMAL HIGH (ref 15–41)
Alkaline Phosphatase: 131 U/L — ABNORMAL HIGH (ref 38–126)
BILIRUBIN TOTAL: 2.2 mg/dL — AB (ref 0.3–1.2)
BUN: 32 mg/dL — ABNORMAL HIGH (ref 6–20)
CO2: 25 mmol/L (ref 22–32)
Calcium: 9.3 mg/dL (ref 8.9–10.3)
Chloride: 94 mmol/L — ABNORMAL LOW (ref 101–111)
Creatinine, Ser: 6.58 mg/dL — ABNORMAL HIGH (ref 0.44–1.00)
GFR calc non Af Amer: 7 mL/min — ABNORMAL LOW (ref >60)
GFR, EST AFRICAN AMERICAN: 8 mL/min — AB (ref >60)
GLUCOSE: 138 mg/dL — AB (ref 65–99)
POTASSIUM: 3.6 mmol/L (ref 3.5–5.1)
SODIUM: 137 mmol/L (ref 135–145)
TOTAL PROTEIN: 8.5 g/dL — AB (ref 6.5–8.1)

## 2016-10-07 LAB — POCT I-STAT 3, ART BLOOD GAS (G3+)
Acid-Base Excess: 5 mmol/L — ABNORMAL HIGH (ref 0.0–2.0)
Bicarbonate: 27.1 mmol/L (ref 20.0–28.0)
O2 Saturation: 100 %
PCO2 ART: 32.1 mmHg (ref 32.0–48.0)
TCO2: 28 mmol/L (ref 0–100)
pH, Arterial: 7.534 — ABNORMAL HIGH (ref 7.350–7.450)
pO2, Arterial: 185 mmHg — ABNORMAL HIGH (ref 83.0–108.0)

## 2016-10-07 LAB — BLOOD GAS, ARTERIAL
ACID-BASE EXCESS: 0.4 mmol/L (ref 0.0–2.0)
Acid-base deficit: 1 mmol/L (ref 0.0–2.0)
BICARBONATE: 24.8 mmol/L (ref 20.0–28.0)
Bicarbonate: 22.3 mmol/L (ref 20.0–28.0)
Drawn by: 23534
Drawn by: 22223
FIO2: 1
FIO2: 60
MECHVT: 360 mL
O2 Content: 100 L/min
O2 Saturation: 95.5 %
O2 Saturation: 99.3 %
PEEP: 5 cmH2O
PEEP: 5 cmH2O
PH ART: 7.512 — AB (ref 7.350–7.450)
RATE: 14 resp/min
RATE: 18 resp/min
VT: 350 mL
pCO2 arterial: 62.9 mmHg — ABNORMAL HIGH (ref 32.0–48.0)
pCO2 arterial: 28.1 mmHg — ABNORMAL LOW (ref 32.0–48.0)
pH, Arterial: 7.233 — ABNORMAL LOW (ref 7.350–7.450)
pO2, Arterial: 111 mmHg — ABNORMAL HIGH (ref 83.0–108.0)
pO2, Arterial: 169 mmHg — ABNORMAL HIGH (ref 83.0–108.0)

## 2016-10-07 LAB — CBC WITH DIFFERENTIAL/PLATELET
Basophils Absolute: 0.1 10*3/uL (ref 0.0–0.1)
Basophils Relative: 1 %
Eosinophils Absolute: 0 10*3/uL (ref 0.0–0.7)
Eosinophils Relative: 0 %
HCT: 34.6 % — ABNORMAL LOW (ref 36.0–46.0)
Hemoglobin: 11.1 g/dL — ABNORMAL LOW (ref 12.0–15.0)
Lymphocytes Relative: 4 %
Lymphs Abs: 0.4 10*3/uL — ABNORMAL LOW (ref 0.7–4.0)
MCH: 32 pg (ref 26.0–34.0)
MCHC: 32.1 g/dL (ref 30.0–36.0)
MCV: 99.7 fL (ref 78.0–100.0)
Monocytes Absolute: 0.9 10*3/uL (ref 0.1–1.0)
Monocytes Relative: 9 %
Neutro Abs: 8.8 10*3/uL — ABNORMAL HIGH (ref 1.7–7.7)
Neutrophils Relative %: 87 %
Platelets: 82 10*3/uL — ABNORMAL LOW (ref 150–400)
RBC: 3.47 MIL/uL — ABNORMAL LOW (ref 3.87–5.11)
RDW: 18.3 % — ABNORMAL HIGH (ref 11.5–15.5)
WBC: 10.1 10*3/uL (ref 4.0–10.5)

## 2016-10-07 LAB — I-STAT CG4 LACTIC ACID, ED: LACTIC ACID, VENOUS: 2.46 mmol/L — AB (ref 0.5–1.9)

## 2016-10-07 LAB — GLUCOSE, CAPILLARY
GLUCOSE-CAPILLARY: 92 mg/dL (ref 65–99)
Glucose-Capillary: 97 mg/dL (ref 65–99)

## 2016-10-07 LAB — LACTIC ACID, PLASMA: LACTIC ACID, VENOUS: 2.3 mmol/L — AB (ref 0.5–1.9)

## 2016-10-07 LAB — PROTIME-INR
INR: 1
PROTHROMBIN TIME: 13.2 s (ref 11.4–15.2)

## 2016-10-07 LAB — I-STAT BETA HCG BLOOD, ED (MC, WL, AP ONLY): I-stat hCG, quantitative: <5 m[IU]/mL (ref <5)

## 2016-10-07 LAB — LIPASE, BLOOD: Lipase: 119 U/L — ABNORMAL HIGH (ref 11–51)

## 2016-10-07 LAB — AMMONIA: Ammonia: 11 umol/L (ref 9–35)

## 2016-10-07 MED ORDER — INSULIN ASPART 100 UNIT/ML ~~LOC~~ SOLN
2.0000 [IU] | SUBCUTANEOUS | Status: DC
  Administered 2016-10-09: 2 [IU] via SUBCUTANEOUS
  Administered 2016-10-10: 4 [IU] via SUBCUTANEOUS

## 2016-10-07 MED ORDER — FENTANYL CITRATE (PF) 100 MCG/2ML IJ SOLN
INTRAMUSCULAR | Status: AC
  Filled 2016-10-07: qty 2

## 2016-10-07 MED ORDER — PROPOFOL 1000 MG/100ML IV EMUL
0.0000 ug/kg/min | INTRAVENOUS | Status: DC
  Administered 2016-10-07: 40 ug/kg/min via INTRAVENOUS

## 2016-10-07 MED ORDER — FENTANYL CITRATE (PF) 100 MCG/2ML IJ SOLN
50.0000 ug | Freq: Once | INTRAMUSCULAR | Status: AC
  Administered 2016-10-07: 50 ug via INTRAVENOUS

## 2016-10-07 MED ORDER — ACYCLOVIR SODIUM 50 MG/ML IV SOLN
INTRAVENOUS | Status: AC
  Filled 2016-10-07: qty 10

## 2016-10-07 MED ORDER — ROCURONIUM BROMIDE 50 MG/5ML IV SOLN
100.0000 mg | Freq: Once | INTRAVENOUS | Status: AC
  Administered 2016-10-07: 100 mg via INTRAVENOUS

## 2016-10-07 MED ORDER — FENTANYL CITRATE (PF) 100 MCG/2ML IJ SOLN: 100.0000 ug | INTRAMUSCULAR | Status: DC | PRN

## 2016-10-07 MED ORDER — SODIUM CHLORIDE 0.9 % IV BOLUS (SEPSIS)
250.0000 mL | Freq: Once | INTRAVENOUS | Status: AC
  Administered 2016-10-07: 250 mL via INTRAVENOUS

## 2016-10-07 MED ORDER — MIDODRINE HCL 5 MG PO TABS: 10.0000 mg | ORAL_TABLET | ORAL | Status: DC

## 2016-10-07 MED ORDER — PANTOPRAZOLE SODIUM 40 MG PO PACK
40.0000 mg | PACK | ORAL | Status: DC
  Administered 2016-10-08 – 2016-10-10 (×3): 40 mg
  Filled 2016-10-07 (×3): qty 20

## 2016-10-07 MED ORDER — ETOMIDATE 2 MG/ML IV SOLN
20.0000 mg | Freq: Once | INTRAVENOUS | Status: AC
  Administered 2016-10-07: 20 mg via INTRAVENOUS

## 2016-10-07 MED ORDER — HEPARIN SODIUM (PORCINE) 5000 UNIT/ML IJ SOLN: 5000.0000 [IU] | Freq: Three times a day (TID) | INTRAMUSCULAR | Status: DC

## 2016-10-07 MED ORDER — DEXTROSE 5 % IV SOLN
10.0000 mg/kg | Freq: Once | INTRAVENOUS | Status: AC
  Administered 2016-10-07: 545 mg via INTRAVENOUS
  Filled 2016-10-07: qty 10.9

## 2016-10-07 MED ORDER — PROPOFOL 1000 MG/100ML IV EMUL
INTRAVENOUS | Status: AC
  Filled 2016-10-07: qty 100

## 2016-10-07 MED ORDER — SODIUM CHLORIDE 0.9 % IV SOLN
250.0000 mL | INTRAVENOUS | Status: DC
  Administered 2016-10-08: 250 mL via INTRAVENOUS

## 2016-10-07 MED ORDER — SODIUM CHLORIDE 0.9 % IV SOLN: 250.0000 mL | INTRAVENOUS | Status: DC | PRN

## 2016-10-07 MED ORDER — PROPOFOL 1000 MG/100ML IV EMUL
5.0000 ug/kg/min | Freq: Once | INTRAVENOUS | Status: AC
  Administered 2016-10-07: 5 ug/kg/min via INTRAVENOUS

## 2016-10-07 MED ORDER — VANCOMYCIN HCL 500 MG IV SOLR: 500.0000 mg | INTRAVENOUS | Status: DC

## 2016-10-07 MED ORDER — NALOXONE HCL 0.4 MG/ML IJ SOLN
INTRAMUSCULAR | Status: AC
  Administered 2016-10-07: 0.4 mg
  Filled 2016-10-07: qty 1

## 2016-10-07 MED ORDER — FENTANYL CITRATE (PF) 100 MCG/2ML IJ SOLN
100.0000 ug | INTRAMUSCULAR | Status: DC | PRN
  Administered 2016-10-08: 100 ug via INTRAVENOUS
  Filled 2016-10-07: qty 2

## 2016-10-07 MED ORDER — VANCOMYCIN HCL IN DEXTROSE 1-5 GM/200ML-% IV SOLN
1000.0000 mg | Freq: Once | INTRAVENOUS | Status: AC
  Administered 2016-10-07: 1000 mg via INTRAVENOUS
  Filled 2016-10-07: qty 200

## 2016-10-07 MED ORDER — PIPERACILLIN-TAZOBACTAM 3.375 G IVPB
3.3750 g | Freq: Two times a day (BID) | INTRAVENOUS | Status: DC
  Filled 2016-10-07: qty 50

## 2016-10-07 MED ORDER — PANTOPRAZOLE SODIUM 40 MG IV SOLR: 40.0000 mg | Freq: Every day | INTRAVENOUS | Status: DC

## 2016-10-07 MED ORDER — SODIUM CHLORIDE 0.9 % IV SOLN
0.0000 ug/min | INTRAVENOUS | Status: DC
  Administered 2016-10-07: 30 ug/min via INTRAVENOUS
  Administered 2016-10-08: 50 ug/min via INTRAVENOUS
  Administered 2016-10-08: 40 ug/min via INTRAVENOUS
  Administered 2016-10-08: 50 ug/min via INTRAVENOUS
  Filled 2016-10-07 (×4): qty 1

## 2016-10-07 MED ORDER — SODIUM CHLORIDE 0.9 % IV BOLUS (SEPSIS)
500.0000 mL | Freq: Once | INTRAVENOUS | Status: AC
  Administered 2016-10-07: 500 mL via INTRAVENOUS

## 2016-10-07 MED ORDER — PROPOFOL 1000 MG/100ML IV EMUL
0.0000 ug/kg/min | INTRAVENOUS | Status: DC
  Administered 2016-10-08: 50 ug/kg/min via INTRAVENOUS
  Filled 2016-10-07 (×2): qty 100

## 2016-10-07 MED ORDER — PIPERACILLIN-TAZOBACTAM 3.375 G IVPB 30 MIN
3.3750 g | Freq: Once | INTRAVENOUS | Status: AC
  Administered 2016-10-07: 3.375 g via INTRAVENOUS
  Filled 2016-10-07: qty 50

## 2016-10-07 MED ORDER — LORAZEPAM 2 MG/ML IJ SOLN
INTRAMUSCULAR | Status: AC
  Administered 2016-10-07: 2 mg
  Filled 2016-10-07: qty 1

## 2016-10-07 MED ORDER — LACTULOSE 10 GM/15ML PO SOLN
30.0000 g | Freq: Three times a day (TID) | ORAL | Status: DC
  Administered 2016-10-07 – 2016-10-08 (×3): 30 g
  Filled 2016-10-07 (×9): qty 45

## 2016-10-07 NOTE — Progress Notes (Signed)
Pharmacy Antibiotic Note  Wanda [name] is a 41 y.o. female admitted on 10/07/2016 with sepsis.  Pharmacy has been consulted for vancomycin and zosyn dosing. Vanc 1 gm IV and zosyn 3.375 gm ordered in the ED  Plan: Cont zosyn 3.375 gm IV q12 hous Cont vancomycin 500 mg after each HD F/u cultures and clinical course  Height: 5' (152.4 cm) Weight: 120 lb 2.4 oz (54.5 kg) IBW/kg (Calculated) : 45.5  Temp (24hrs), Avg:97.8 F (36.6 C), Min:97.1 F (36.2 C), Max:98.4 F (36.9 C)   Recent Labs Lab 10/07/16 1705 10/07/16 1716  WBC 10.1  --   CREATININE 6.58*  --   LATICACIDVEN  --  2.46*    Estimated Creatinine Clearance: 8 mL/min (by C-G formula based on SCr of 6.58 mg/dL (H)).    Allergies  Allergen Reactions  . Lasix [Furosemide] Other (See Comments)    "doesn't work"  . Latex Itching    Antimicrobials this admission: vanc 2/28 >>  zosyn 2/28 >>    Thank you for allowing pharmacy to be a part of this patient's care.  Excell Seltzer [place]et 10/07/2016 6:08 PM

## 2016-10-07 NOTE — ED Notes (Signed)
[name] [phone]

## 2016-10-07 NOTE — ED Triage Notes (Addendum)
Pt was brought in EMS from [place]. On dialysis machine 2hrs and 45 mins and went unresponsive. C BG 136  Vitals 142/74, 114, 16 en route. Responds to pain, eyes deviated to left

## 2016-10-07 NOTE — ED Notes (Signed)
Dr Long at bedside

## 2016-10-07 NOTE — ED Notes (Signed)
Family at bedside. 

## 2016-10-07 NOTE — ED Notes (Signed)
Called significant other to inform him that pt is going to be transferred to [place].

## 2016-10-07 NOTE — Progress Notes (Signed)
eLink Physician-Brief Progress Note Patient Name: Wanda [name] DOB: 08-27-1975 MRN: [id number]   Date of Service  10/07/2016  HPI/Events of Note  41  Yo with ESRD on HD with liver cirrhosis Admitted to ICU for acute resp failure from acute encephalopathy ?seizure?CVA   eICU Interventions  Admit to ICU, Vent support, vasopressor support, check labs, recommend  Neuro consult, EEG.         Flora Lipps 10/07/2016, 10:07 PM

## 2016-10-07 NOTE — H&P (Signed)
PULMONARY / CRITICAL CARE MEDICINE   Name: Wanda [name] MRN: [id number] DOB: November 05, 1975    ADMISSION DATE:  10/07/2016 CONSULTATION DATE:  10/07/2016   REFERRING MD:  Dr. Laverta Baltimore  CHIEF COMPLAINT: Encephalopathic   HISTORY OF PRESENT ILLNESS:   41 year old female with PMH of ESRD on HD (T/R/F), GERD, Liver Cirrhosis with ascites and H/O varices, Depression, Chronic Hypotension, and Thrombocytopenia presents to [facility] ED on 2/28 after becoming unresponsive during HD session. No response to Narcan. Intubated for airway protection. Noted to have episode of clonic jerking of upper extremities.Transferred to [facility] for [facility] management. PCCM asked to admit.   Of note patient was admitted 2/14-2/18 after missing HD sessions for over a weak, then on 2/13 during HD she became confused and combative, then progressively unresponsive. During stay intubated 2/13 and then extubated 2/15 with a paracentesis of 3.6L out on 2/14.   PAST MEDICAL HISTORY :  She  has a past medical history of Acute blood loss anemia (02/25/2014); Acute renal failure ([place]) (09/2013); Anasarca (10/10/2013); Anxiety; Bipolar disorder ([place]) (12/04/2013); Bleeding esophageal varices ([facility]) (02/28/2014); C. difficile colitis (04/19/2014); Chronic hypotension; Cirrhosis ([place]) (10/05/13); Cirrhosis of liver with ascites ([place]); Depression; ESRD (end stage renal disease) on dialysis ([place]) (08/2014); Folate deficiency (09/2013); Gastroesophageal junction ulcer (09/17/2014); GERD (gastroesophageal reflux disease); Hematemesis/vomiting blood (02/24/2014); Macrocytosis (02/28/2014); PNA (pneumonia) (10/13/2013); SBP (spontaneous bacterial peritonitis) ([place]) (11/10/2013); and Thrombocytopenia ([place]).  PAST SURGICAL HISTORY: She  has a past surgical history that includes None; Paracentesis (Feb 2015); Esophagogastroduodenoscopy (N/A, 11/14/2013); Paracentesis (10/2013); Colonoscopy (N/A, 12/19/2013); Esophagogastroduodenoscopy (N/A, 02/11/2014);  Esophagogastroduodenoscopy (N/A, 07/04/2014); esophageal banding (07/04/2014); Esophagogastroduodenoscopy (egd) with propofol (N/A, 07/24/2014); esophageal banding (N/A, 07/24/2014); Central venous catheter insertion (Right); Esophagogastroduodenoscopy (N/A, 09/16/2014); Esophagogastroduodenoscopy (egd) with propofol (N/A, 10/26/2014); AV fistula placement (Right, 11/16/2014); Esophagogastroduodenoscopy (N/A, 12/14/2014); Esophagogastroduodenoscopy (N/A, 03/27/2015); Esophagogastroduodenoscopy (N/A, 06/05/2015); Esophagogastroduodenoscopy (N/A, 07/23/2015); esophageal banding (N/A, 07/23/2015); Esophagogastroduodenoscopy (N/A, 03/04/2016); esophageal banding (N/A, 03/04/2016); Esophagogastroduodenoscopy (egd) with propofol (N/A, 04/30/2016); esophageal banding (N/A, 04/30/2016); and Esophagogastroduodenoscopy (N/A, 05/21/2016).  Allergies  Allergen Reactions  . Lasix [Furosemide] Other (See Comments)    "doesn't work"  . Latex Itching    No current facility-administered medications on file prior to encounter.    Current Outpatient Prescriptions on File Prior to Encounter  Medication Sig  . clotrimazole (LOTRIMIN) 1 % cream Apply 1 application topically 3 (three) times daily.  . Darbepoetin Alfa (ARANESP) 60 MCG/0.3ML SOSY injection Inject 0.3 mLs (60 mcg total) into the vein every Thursday with hemodialysis. (Patient taking differently: Inject 60 mcg into the vein every Tuesday with hemodialysis. )  . lactulose (CHRONULAC) 10 GM/15ML solution Take 45 mLs (30 g total) by mouth 3 (three) times daily. TAKE 15-30 ml BY MOUTH three times a day (Patient taking differently: Take 10-20 g by mouth 3 (three) times daily. TAKE 15-30 ml BY MOUTH three times a day)  . lidocaine (XYLOCAINE) 2 % solution TAKE TWO TEASPOONSFUL (10ML) BY MOUTH BEFORE MEALS AND AT BEDTIME TO PREVENT CHEST PAIN WHILE EATING  . lidocaine-prilocaine (EMLA) cream Apply 1 application topically as needed (for dialysis treatments).  . midodrine  (PROAMATINE) 10 MG tablet Take 1 tablet (10 mg total) by mouth every morning. (Patient taking differently: Take 10 mg by mouth See admin instructions. Take ONLY on dialysis days)  . omeprazole (PRILOSEC) 20 MG capsule 1 po every morning 30 minutes prior to your first meal. (Patient taking differently: Take 20 mg by mouth daily before breakfast. 1 po every morning 30 minutes  prior to your first meal.)  . Oxycodone HCl 10 MG TABS Take 0.5 tablets (5 mg total) by mouth 3 (three) times daily as needed. 1 PO TID AS NEEDED FOR PAIN (Patient taking differently: Take 5-10 mg by mouth 3 (three) times daily as needed. )  . RENVELA 800 MG tablet Take 1600 mg by mouth 3 times daily with meals. Take 800 mg by mouth with snacks.    FAMILY HISTORY:  Her indicated that her mother is deceased. She indicated that her father is deceased. She indicated that her sister is alive. She indicated that her brother is alive. She indicated that the status of her neg hx is unknown.    SOCIAL HISTORY: She  reports that she quit smoking about 7 years ago. Her smoking use included Cigarettes. She has a 5.00 pack-year smoking history. She has never used smokeless tobacco. She reports that she does not drink alcohol or use drugs.  REVIEW OF SYSTEMS:   Unable to review as patient is sedated and on vent.   SUBJECTIVE:  Arrived Intubated on Neo gtt and Sedated on propofol gtt.   VITAL SIGNS: BP 101/57   Pulse (!) 126   Temp 99.5 F (37.5 C)   Resp 24   Ht 5' (1.524 m)   Wt 54.5 kg (120 lb 2.4 oz)   LMP 09/21/2013   SpO2 100%   BMI 23.47 kg/m   HEMODYNAMICS:    VENTILATOR SETTINGS: Vent Mode: PRVC FiO2 (%):  [100 %] 100 % Set Rate:  [14 bmp] 14 bmp Vt Set:  [360 mL] 360 mL PEEP:  [5 cmH20] 5 cmH20 Plateau Pressure:  [14 cmH20] 14 cmH20  INTAKE / OUTPUT: No intake/output data recorded.  PHYSICAL EXAMINATION: General:  Adult female, no distress  Neuro:  Sedated, withdrawals all extremities to pain, pupils  intact, +gag HEENT:  ETT in place  Cardiovascular:  Tachy, NI S1/S2, no MRG Lungs: non-labored, no wheezes/crackles   Abdomen:  Distended, active bowel sounds, umbilical hernia  Musculoskeletal:  No acute  Skin:  Warm, dry, intact   LABS:  BMET  Recent Labs Lab 10/07/16 1705  NA 137  K 3.6  CL 94*  CO2 25  BUN 32*  CREATININE 6.58*  GLUCOSE 138*    Electrolytes  Recent Labs Lab 10/07/16 1705  CALCIUM 9.3    CBC  Recent Labs Lab 10/07/16 1705  WBC 10.1  HGB 11.1*  HCT 34.6*  PLT 82*    Coag's  Recent Labs Lab 10/07/16 1705  INR 1.00    Sepsis Markers  Recent Labs Lab 10/07/16 1706 10/07/16 1716  LATICACIDVEN 2.3* 2.46*    ABG  Recent Labs Lab 10/07/16 1747 10/07/16 2110  PHART 7.233* 7.512*  PCO2ART 62.9* 28.1*  PO2ART 111* 169.0*    Liver Enzymes  Recent Labs Lab 10/07/16 1705  AST 68*  ALT 31  ALKPHOS 131*  BILITOT 2.2*  ALBUMIN 3.8    Cardiac Enzymes No results for input(s): TROPONINI, PROBNP in the last 168 hours.  Glucose No results for input(s): GLUCAP in the last 168 hours.  Imaging Ct Head Wo Contrast  Result Date: 10/07/2016 CLINICAL DATA:  Seizure activity.  End-stage renal disease EXAM: CT HEAD WITHOUT CONTRAST TECHNIQUE: Contiguous axial images were obtained from the base of the skull through the vertex without intravenous contrast. COMPARISON:  September 22, 2016 FINDINGS: Brain: The ventricles are normal in size and configuration. There is no intracranial mass, hemorrhage, extra-axial fluid collection, or midline shift gray-white compartments  are normal. No acute infarct evident. Vascular: No hyperdense vessel. Calcification is noted in the carotid siphon regions bilaterally. Skull: The bony calvarium appears intact. Sinuses/Orbits: Visualized paranasal sinuses are clear. Visualized orbits appear symmetric bilaterally. Other: Visualized mastoid air cells are clear. IMPRESSION: Vascular calcification in the carotid  siphon regions. No intracranial mass, hemorrhage, or extra-axial fluid collection. Gray-white compartments are normal. Electronically Signed   By: Lowella Grip III M.D.   On: 10/07/2016 17:20   Dg Chest Portable 1 View  Result Date: 10/07/2016 CLINICAL DATA:  Dialysis.  Unresponsive . EXAM: PORTABLE CHEST 1 VIEW COMPARISON:  09/25/2016 . FINDINGS: Endotracheal tube noted with tip 3.7 cm above the carina. NG tube noted with tip below left hemidiaphragm. Mild cardiomegaly with mild pulmonary vascular prominence. Left base subsegmental atelectasis. Small left pleural effusion cannot be excluded. No pneumothorax . IMPRESSION: 1.  Endotracheal tube and NG tube in stable position. 2.  Cardiomegaly with mild pulmonary vascular prominence. 3. Mild left base subsegmental atelectasis. Small left pleural effusion cannot be excluded . Electronically Signed   By: Marcello Moores  Register   On: 10/07/2016 16:53     STUDIES:  CXR 2/28 > Cardiomegaly with mild pulmonary vascular prominence, mild left base subsegmental atelectasis, small left pleural effusion can not be excluded  CT Head 2/28 > Vascular calcification in the carotid siphon regions. No intracranial mass, hemorrhage, or extra-axial fluid collections, gray-white compartments are normal  EEG 2/28 >>  CULTURES: Blood 2/28 >> Urinalysis 2/28 >>  ANTIBIOTICS: Vancomycin 2/28 in ED Zosyn 2/28 in ED   SIGNIFICANT EVENTS: 2/28 > Presents to ED - during HD went unresponsive   LINES/TUBES: ETT 2/28 >>  DISCUSSION: 41 year old female with PMH of liver cirrhosis and ESRD on HD T/R/S presents to ED on 2/28 after becoming unresponsive during HD session.   ASSESSMENT / PLAN:  PULMONARY A: Inability to protect airway secondary to encephalopathy  P:   Vent Support Follow ABG CXR now  VAP prevention protocol  Daily SBT and WUA   CARDIOVASCULAR A:  Chronic Hypotension  P:  Cardiac Monitoring  Trend Trop Continue home Midodrine on HD Days   Wean Neo for Systolic Goal [id number]  RENAL A:   ESRD (T/R/S) P:   Consult Nephrology in AM  Trend BMP Replace electrolytes as needed   GASTROINTESTINAL A:   Hepatic Cirrhosis with Ascites  H/O Varices  P:   Continue Home Lactulose  NPO PPI  HEMATOLOGIC A:   Chronic Thrombocytopenia  Chronic Anemia  P:  Trend CBC  SCDS will hold SQ Heparin   INFECTIOUS A:   Elevated Lactic Acid in setting of ESRD and ?post-seizure vs infection  -Afebrile, no WBC  P:   Trend Fever and WBC curve  Monitor off antibiotics  Trend Procal and lactic acid   ENDOCRINE A:   Hyperglycemia  P:   CBG monitoring  SSI  NEUROLOGIC A:   Acute Metabolic vs Hepatic Encephalopathy Seizures vs Tremors   P:   Wean Propofol to achieve RASS Fentanyl PRN  EEG pending  RASS goal: 0/-1   FAMILY  - Updates: No family at bedside    - Inter-disciplinary family meet or Palliative Care meeting due by: 3/7  CC Time: 50 minutes   Hayden Pedro, AG-ACNP [facility]  Pgr: [phone]  PCCM Pgr: [phone]

## 2016-10-07 NOTE — ED Notes (Signed)
Arterial Blood gas results  PH 7.23 PCo2 62.9 P02 111 Bicarb 22.3 SO2 95.9 Results given to Dr Laverta Baltimore

## 2016-10-07 NOTE — ED Notes (Signed)
[name] pt significant other cell # is [phone]

## 2016-10-07 NOTE — ED Provider Notes (Signed)
Emergency Department Provider Note   I have reviewed the triage vital signs and the nursing notes.   HISTORY  Chief Complaint Loss of Consciousness   HPI Wanda [name] is a 41 y.o. female with PMH of ESRD on HD, Cirrhosis, Hepatic Encephalopathy, and anemia as to the emergency department for evaluation of syncope during dialysis today. The patient was on dialysis for more than 2 hours when she suddenly became unresponsive. EMS was called. Patient had normal blood sugar, blood pressure, and elevated heart rate. She was breathing quickly and was minimally responsive to pain.   Level 5 caveat: AMS with intubation   Past Medical History:  Diagnosis Date  . Acute blood loss anemia 02/25/2014   Status post transfusion  . Acute renal failure ([place]) 09/2013   Pre-renal- resolved  . Anasarca 10/10/2013  . Anxiety   . Bipolar disorder ([place]) 12/04/2013   2007-SEEN IN ED FOR INVOLUNTARY COMMITMENT, UDS POS FOR AMPHETAMINES/OPIATES   . Bleeding esophageal varices ([place]) 02/28/2014   s/p banding  . C. difficile colitis 04/19/2014  . Chronic hypotension   . Cirrhosis ([place]) 10/05/13   Liver bx 11/23/13 (delayed initially due to patient refusal). c/w steatohepatitis  . Cirrhosis of liver with ascites ([place])   . Depression   . ESRD (end stage renal disease) on dialysis ([place]) 08/2014  . Folate deficiency 09/2013  . Gastroesophageal junction ulcer 09/17/2014  . GERD (gastroesophageal reflux disease)   . Hematemesis/vomiting blood 02/24/2014  . Macrocytosis 02/28/2014  . PNA (pneumonia) 10/13/2013  . SBP (spontaneous bacterial peritonitis) ([place]) 11/10/2013  . Thrombocytopenia ([place])    Hypercellular bone marrow; abundant megakaryocytes per 08/27/2014; s/p bone marrow bx    Patient Active Problem List   Diagnosis Date Noted  . Pressure injury of skin 05/20/2016  . Shock ([place]) 05/19/2016  . Rhonchi   . Chronic pain syndrome 08/22/2015  . Abdominal wall cellulitis 08/20/2015  . Chronic abdominal pain  08/07/2015  . Umbilical hernia without obstruction and without gangrene 08/07/2015  . Acute blood loss anemia   . Hypotension 06/05/2015  . Edema of left lower extremity 02/22/2015  . UGIB (upper gastrointestinal bleed) 09/16/2014  . ESRD (end stage renal disease) on dialysis ([place])   . Macrocytosis 09/12/2014  . Thrombocytopenia ([place]) 08/19/2014  . Esophageal varices without bleeding ([place])   . Portal hypertensive gastropathy   . Malnutrition of moderate degree ([place]) 05/15/2014  . C. difficile colitis 04/19/2014  . Bipolar disorder ([place]) 12/04/2013  . SBP (spontaneous bacterial peritonitis) ([place]) 11/10/2013  . Liver cirrhosis secondary to nonalcoholic steatohepatitis (NASH) ([place]) 11/07/2013  . PNA (pneumonia) 10/13/2013  . Folate deficiency 10/13/2013  . Ascites due to alcoholic cirrhosis ([place]) [id number]    Past Surgical History:  Procedure Laterality Date  . AV FISTULA PLACEMENT Right 11/16/2014   Procedure: Right arm Creation of arteriovenous fistula;  Surgeon: Angelia Mould, MD;  Location: [facility];  Service: Vascular;  Laterality: Right;  . CENTRAL VENOUS CATHETER INSERTION Right   . COLONOSCOPY N/A 12/19/2013   SLF:NO OBVIOUS SOURCE FOR ANEMIA IDETIFIED/ONE COLON POLYP REMOVED/Small internal hemorrhoids  . ESOPHAGEAL BANDING  07/04/2014   Procedure: ESOPHAGEAL BANDING;  Surgeon: Daneil Dolin, MD;  [facility];  Service: Endoscopy;;  . ESOPHAGEAL BANDING N/A 07/24/2014   Procedure: ESOPHAGEAL BANDING (2 bands applied);  Surgeon: Danie Binder, MD;  Location: [facility];  Service: Endoscopy;  Laterality: N/A;  . ESOPHAGEAL BANDING N/A 07/23/2015   Procedure: ESOPHAGEAL BANDING;  Surgeon: Carlyon Prows  Rexene Edison, MD;  [facility];  Service: Endoscopy;  Laterality: N/A;  . ESOPHAGEAL BANDING N/A 03/04/2016   Procedure: ESOPHAGEAL BANDING;  Surgeon: Danie Binder, MD;  [facility];  Service: Endoscopy;  Laterality: N/A;  . ESOPHAGEAL BANDING N/A  04/30/2016   Procedure: ESOPHAGEAL BANDING;  Surgeon: Daneil Dolin, MD;  [facility];  Service: Endoscopy;  Laterality: N/A;  . ESOPHAGOGASTRODUODENOSCOPY N/A 11/14/2013   SLF:1 column of very small varices in distal esopahgus/MODERATE PORTAL GASTROPATHY IN PROXIMAL STOMACH/MODERATE erosive gastritis  . ESOPHAGOGASTRODUODENOSCOPY N/A 02/11/2014   Dr. Rourk:Esophageal varices with bleeding stigmata-status post esophageal band ligation therapy. Portal gastropathy  . ESOPHAGOGASTRODUODENOSCOPY N/A 07/04/2014   RMR: Persiting grade 2 esophageal varicies with bleeding stigmata status post band ligation. Significantly congested gastric mucosa iwith changes constistant with protal gastropathy.   . ESOPHAGOGASTRODUODENOSCOPY N/A 09/16/2014   Rehman: Single short column of varix proximal to GE junction not large enough to be banded. Two amall ulcers at the GEJ felt to be source of GI Bleeding but no active bleeding but no actibe bleeding noted. No therapy rendered. Portal gastropathy NO evidence of peptic ulcer diease or gastric varices.   . ESOPHAGOGASTRODUODENOSCOPY N/A 12/14/2014   SLF: Grade ! esophageal varices. 2. Moderate Portal Gastropathy  . ESOPHAGOGASTRODUODENOSCOPY N/A 03/27/2015   Procedure: ESOPHAGOGASTRODUODENOSCOPY (EGD);  Surgeon: Danie Binder, MD;  [facility];  Service: Endoscopy;  Laterality: N/A;  1045am - moved to 817 @ 11:30  . ESOPHAGOGASTRODUODENOSCOPY N/A 06/05/2015   Procedure: ESOPHAGOGASTRODUODENOSCOPY (EGD);  Surgeon: Clarene Essex, MD;  [facility] END[facility]COPY;  Service: Endoscopy;  Laterality: N/A;  . ESOPHAGOGASTRODUODENOSCOPY N/A 07/23/2015   SLF:1. Grade 1 esophageal varices 2. Moderate portal hypertensive gastropathy 3. MILd non-erosive gastritis.   [name] ESOPHAGOGASTRODUODENOSCOPY N/A 03/04/2016   Dr. Oneida Alar: grade 1 varices, surveillance in Jan 2017   . ESOPHAGOGASTRODUODENOSCOPY N/A 05/21/2016   Procedure: ESOPHAGOGASTRODUODENOSCOPY (EGD);  Surgeon: Gatha Mayer, MD;  Location: [facility] ENDOSCOPY;  Service: Endoscopy;  Laterality: N/A;  . ESOPHAGOGASTRODUODENOSCOPY (EGD) WITH PROPOFOL N/A 07/24/2014   SLF:  1. 2 columns grade 2-3 varices- 2 Bands applied.  2.  Moderate gastropathy 3. Duodenal Diverticula  . ESOPHAGOGASTRODUODENOSCOPY (EGD) WITH PROPOFOL N/A 10/26/2014   RMR: 2 columns of grade 2 esophageal varices without obvious bleeding stigmata status post band ligation to complet obliteration of remaining varices.   . ESOPHAGOGASTRODUODENOSCOPY (EGD) WITH PROPOFOL N/A 04/30/2016   Procedure: ESOPHAGOGASTRODUODENOSCOPY (EGD) WITH PROPOFOL;  Surgeon: Daneil Dolin, MD;  [facility];  Service: Endoscopy;  Laterality: N/A;  . None    . PARACENTESIS  Feb 2015   1180 fluid, negative fluid analysis.   [name] PARACENTESIS  10/2013    Current Outpatient Rx  . Order #: [id number] Class: Normal  . Order #: [id number] Class: No Print  . Order #: [id number] Class: No Print  . Order #: [id number] Class: Normal  . Order #: [id number] Class: Historical Med  . Order #: [id number] Class: Historical Med  . Order #: [id number] Class: No Print  . Order #: [id number] Class: Normal  . Order #: [id number] Class: No Print  . Order #: [id number] Class: Historical Med    Allergies Lasix [furosemide] and Latex  Family History  Problem Relation Age of Onset  . Heart disease Mother   . Colon cancer Neg Hx   . Liver disease Neg Hx     Social History Social History  Substance Use Topics  . Smoking status: Former Smoker    Packs/day: 0.25  Years: 20.00    Types: Cigarettes    Quit date: 11/05/2008  . Smokeless tobacco: Never Used     Comment: Never really smoked much  . Alcohol use No    Review of Systems  Level 5 caveat: AMS with intubation  ____________________________________________   PHYSICAL EXAM:  VITAL SIGNS: ED Triage Vitals [10/07/16 1553]  Enc Vitals Group     BP 129/59     Pulse Rate 120     Resp (!) 31     SpO2 97 %     Weight 120 lb 2.4  oz (54.5 kg)     Height 5' (1.524 m)   Constitutional: Somnolent. Withdrawing from pain. Sonorous, wet-sounding respirations. Occasional posturing-like movements. No tonic-clonic activity.  Eyes: Conjunctivae are normal. Pupils are dilated (17mm) and weakly reactive. Notable left gaze deviation.  Head: Atraumatic. Nose: No congestion/rhinnorhea. Mouth/Throat: Mucous membranes are dry. Oropharynx non-erythematous. Neck: No stridor.   Cardiovascular: Tachycardia. Good peripheral circulation. Grossly normal heart sounds.   Respiratory: Increased respiratory effort.  No retractions. Lungs with diffuse rhonchi.  Gastrointestinal: Soft and nontender. No positive distension and fluid wave.  Musculoskeletal: No lower extremity tenderness. B/L LE edema.  No gross deformities of extremities. Neurologic: GCS 8 (E2V2M4).  Skin:  Skin is warm, dry and intact. No rash noted.   ____________________________________________   LABS (all labs ordered are listed, but only abnormal results are displayed)  Labs Reviewed  COMPREHENSIVE METABOLIC PANEL - Abnormal; Notable for the following:       Result Value   Chloride 94 (*)    Glucose, Bld 138 (*)    BUN 32 (*)    Creatinine, Ser 6.58 (*)    Total Protein 8.5 (*)    AST 68 (*)    Alkaline Phosphatase 131 (*)    Total Bilirubin 2.2 (*)    GFR calc non Af Amer 7 (*)    GFR calc Af Amer 8 (*)    Anion gap 18 (*)    All other components within normal limits  LIPASE, BLOOD - Abnormal; Notable for the following:    Lipase 119 (*)    All other components within normal limits  CBC WITH DIFFERENTIAL/PLATELET - Abnormal; Notable for the following:    RBC 3.47 (*)    Hemoglobin 11.1 (*)    HCT 34.6 (*)    RDW 18.3 (*)    Platelets 82 (*)    Neutro Abs 8.8 (*)    Lymphs Abs 0.4 (*)    All other components within normal limits  BLOOD GAS, ARTERIAL - Abnormal; Notable for the following:    pH, Arterial 7.233 (*)    pCO2 arterial 62.9 (*)    pO2,  Arterial 111 (*)    All other components within normal limits  LACTIC ACID, PLASMA - Abnormal; Notable for the following:    Lactic Acid, Venous 2.3 (*)    All other components within normal limits  I-STAT CG4 LACTIC ACID, ED - Abnormal; Notable for the following:    Lactic Acid, Venous 2.46 (*)    All other components within normal limits  CULTURE, BLOOD (ROUTINE X 2)  CULTURE, BLOOD (ROUTINE X 2)  PROTIME-INR  AMMONIA  URINALYSIS, ROUTINE W REFLEX MICROSCOPIC  I-STAT BETA HCG BLOOD, ED ([facility], [facility], [facility] ONLY)   ____________________________________________  EKG   EKG Interpretation  Date/Time:  Wednesday October 07 2016 17:08:30 EST Ventricular Rate:  115 PR Interval:    QRS Duration: 85 QT Interval:  331  QTC Calculation: 458 R Axis:   55 Text Interpretation:  Sinus tachycardia Atrial premature complex Right atrial enlargement Borderline ST depression, diffuse leads Minimal ST elevation, lateral leads No STEMI. Similar to prior.  Confirmed by Jeslyn Amsler MD, [place] [phone]) on 10/07/2016 5:40:19 PM       ____________________________________________  RADIOLOGY  Ct Head Wo Contrast  Result Date: 10/07/2016 CLINICAL DATA:  Seizure activity.  End-stage renal disease EXAM: CT HEAD WITHOUT CONTRAST TECHNIQUE: Contiguous axial images were obtained from the base of the skull through the vertex without intravenous contrast. COMPARISON:  September 22, 2016 FINDINGS: Brain: The ventricles are normal in size and configuration. There is no intracranial mass, hemorrhage, extra-axial fluid collection, or midline shift gray-white compartments are normal. No acute infarct evident. Vascular: No hyperdense vessel. Calcification is noted in the carotid siphon regions bilaterally. Skull: The bony calvarium appears intact. Sinuses/Orbits: Visualized paranasal sinuses are clear. Visualized orbits appear symmetric bilaterally. Other: Visualized mastoid air cells are clear. IMPRESSION: Vascular calcification in the  carotid siphon regions. No intracranial mass, hemorrhage, or extra-axial fluid collection. Gray-white compartments are normal. Electronically Signed   By: Lowella Grip III M.D.   On: 10/07/2016 17:20   Dg Chest Portable 1 View  Result Date: 10/07/2016 CLINICAL DATA:  Dialysis.  Unresponsive . EXAM: PORTABLE CHEST 1 VIEW COMPARISON:  09/25/2016 . FINDINGS: Endotracheal tube noted with tip 3.7 cm above the carina. NG tube noted with tip below left hemidiaphragm. Mild cardiomegaly with mild pulmonary vascular prominence. Left base subsegmental atelectasis. Small left pleural effusion cannot be excluded. No pneumothorax . IMPRESSION: 1.  Endotracheal tube and NG tube in stable position. 2.  Cardiomegaly with mild pulmonary vascular prominence. 3. Mild left base subsegmental atelectasis. Small left pleural effusion cannot be excluded . Electronically Signed   By: Marcello Moores  Register   On: 10/07/2016 16:53    ____________________________________________   PROCEDURES  Procedure(s) performed:   Date/Time: 10/07/2016 7:50 PM Performed by: Margette Fast Pre-anesthesia Checklist: Patient identified, Emergency Drugs available, Suction available and Patient being monitored Oxygen Delivery Method: Ambu bag Preoxygenation: Pre-oxygenation with 100% oxygen Intubation Type: Rapid sequence Ventilation: Mask ventilation without difficulty Laryngoscope Size: Glidescope and 3 Grade View: Grade I Tube size: 7.5 mm Number of attempts: 1 Airway Equipment and Method: Patient positioned with wedge pillow and Video-laryngoscopy Placement Confirmation: ETT inserted through vocal cords under direct vision Tube secured with: ETT holder Dental Injury: Teeth and Oropharynx as per pre-operative assessment        CRITICAL CARE Performed by: Margette Fast Total critical care time: 75 minutes Critical care time was exclusive of separately billable procedures and treating other patients. Critical care was  necessary to treat or prevent imminent or life-threatening deterioration. Critical care was time spent personally by me on the following activities: development of treatment plan with patient and/or surrogate as well as nursing, discussions with consultants, evaluation of patient's response to treatment, examination of patient, obtaining history from patient or surrogate, ordering and performing treatments and interventions, ordering and review of laboratory studies, ordering and review of radiographic studies, pulse oximetry and re-evaluation of patient's condition.  Nanda Quinton, MD Emergency Medicine  ____________________________________________   INITIAL IMPRESSION / ASSESSMENT AND PLAN / ED COURSE  Pertinent labs & imaging results that were available during my care of the patient were reviewed by me and considered in my medical decision making (see chart for details).  Patient arrived to the emergency department with rapid breathing, withdrawal from painful stimuli, left eye deviation  with dilated pupils, and questionable posturing at times. She was unable to provide any history. We gave Narcan with no response. There was some concern for possible seizure with ideation and so 2 mg of Ativan was given with no improvement in symptoms. The patient was at this time breathing faster and having some now gurgling respirations. She was intubated for airway protection and propofol was started. After managing the patient's acute presentation I had a chance to look back at her most recent admission where she presented with a similar presentation. Ultimately it seems that this was blamed on hepatic encephalopathy. No abnormal CT at that time. Plan to repeat the CT scan and labs.   06:50 PM Spoke with Dr. Mortimer Fries at [facility] who will accept the transfer to the ICU. Recommends acyclovir. No beds available at this time. Anticipate something tomorrow.   07:49 PM Bed accepted at [facility]. Completed EMTALA. Patient  remains HDS with improving tachycardia with increased sedation and mild fluid bolus. She is stable for transfer.  ____________________________________________  FINAL CLINICAL IMPRESSION(S) / ED DIAGNOSES  Final diagnoses:  Syncope and collapse  Acute respiratory failure, unspecified whether with hypoxia or hypercapnia (HCC)     MEDICATIONS GIVEN DURING THIS VISIT:  Medications  piperacillin-tazobactam (ZOSYN) IVPB 3.375 g (not administered)  vancomycin (VANCOCIN) 500 mg in sodium chloride 0.9 % 100 mL IVPB (not administered)  acyclovir (ZOVIRAX) 545 mg in dextrose 5 % 100 mL IVPB (545 mg Intravenous New Bag/Given 10/07/16 1939)  naloxone (NARCAN) 0.4 MG/ML injection (0.4 mg  Given 10/07/16 1602)  LORazepam (ATIVAN) 2 MG/ML injection (2 mg  Given 10/07/16 1608)  propofol (DIPRIVAN) 1000 MG/100ML infusion (20 mcg/kg/min  54.5 kg Intravenous Rate/Dose Change 10/07/16 1939)  etomidate (AMIDATE) injection 20 mg (20 mg Intravenous Given 10/07/16 1618)  rocuronium (ZEMURON) injection 100 mg (100 mg Intravenous Given 10/07/16 1619)  piperacillin-tazobactam (ZOSYN) IVPB 3.375 g (0 g Intravenous Stopped 10/07/16 1846)  vancomycin (VANCOCIN) IVPB 1000 mg/200 mL premix (0 mg Intravenous Stopped 10/07/16 1922)  sodium chloride 0.9 % bolus 500 mL (500 mLs Intravenous New Bag/Given 10/07/16 1849)     NEW OUTPATIENT MEDICATIONS STARTED DURING THIS VISIT:  None   Note:  This document was prepared using Dragon voice recognition software and may include unintentional dictation errors.  Nanda Quinton, MD Emergency Medicine   Margette Fast, MD 10/07/16 [phone]

## 2016-10-07 NOTE — ED Notes (Signed)
Pt moving feet and hands sedation increased

## 2016-10-07 NOTE — ED Notes (Signed)
Lab able to obtain one set of cultures

## 2016-10-07 NOTE — ED Notes (Signed)
No response from narcan. Pts extremities are tense Eyes remain deviated to left. Will  Not follow commands. Dr Laverta Baltimore at bedside. Order to give ativan 2 mg IV

## 2016-10-07 NOTE — ED Notes (Signed)
Pts belongings sent with family member [name].

## 2016-10-08 ENCOUNTER — Inpatient Hospital Stay (HOSPITAL_COMMUNITY): Payer: Medicare Other

## 2016-10-08 DIAGNOSIS — R569 Unspecified convulsions: Secondary | ICD-10-CM

## 2016-10-08 DIAGNOSIS — R7889 Finding of other specified substances, not normally found in blood: Secondary | ICD-10-CM

## 2016-10-08 DIAGNOSIS — J9602 Acute respiratory failure with hypercapnia: Secondary | ICD-10-CM

## 2016-10-08 LAB — BLOOD GAS, ARTERIAL
ACID-BASE DEFICIT: 0.5 mmol/L (ref 0.0–2.0)
BICARBONATE: 22.4 mmol/L (ref 20.0–28.0)
DRAWN BY: 418751
FIO2: 40
LHR: 12 {breaths}/min
MECHVT: 350 mL
O2 SAT: 98.7 %
PATIENT TEMPERATURE: 98.6
PEEP/CPAP: 8 cmH2O
PO2 ART: 126 mmHg — AB (ref 83.0–108.0)
pCO2 arterial: 29.8 mmHg — ABNORMAL LOW (ref 32.0–48.0)
pH, Arterial: 7.49 — ABNORMAL HIGH (ref 7.350–7.450)

## 2016-10-08 LAB — PHOSPHORUS: Phosphorus: 7.9 mg/dL — ABNORMAL HIGH (ref 2.5–4.6)

## 2016-10-08 LAB — GLUCOSE, CAPILLARY
GLUCOSE-CAPILLARY: 86 mg/dL (ref 65–99)
GLUCOSE-CAPILLARY: 89 mg/dL (ref 65–99)
GLUCOSE-CAPILLARY: 90 mg/dL (ref 65–99)
Glucose-Capillary: 103 mg/dL — ABNORMAL HIGH (ref 65–99)
Glucose-Capillary: 94 mg/dL (ref 65–99)
Glucose-Capillary: 93 mg/dL (ref 65–99)

## 2016-10-08 LAB — TRIGLYCERIDES: Triglycerides: 147 mg/dL (ref <150)

## 2016-10-08 LAB — TROPONIN I
TROPONIN I: 4.32 ng/mL — AB (ref <0.03)
TROPONIN I: 6.1 ng/mL — AB (ref <0.03)
Troponin I: 4.4 ng/mL (ref <0.03)

## 2016-10-08 LAB — ECHOCARDIOGRAM COMPLETE
HEIGHTINCHES: 61 in
WEIGHTICAEL: 2045.87 [oz_av]

## 2016-10-08 LAB — CBC
HCT: 33.6 % — ABNORMAL LOW (ref 36.0–46.0)
HEMATOCRIT: 33.2 % — AB (ref 36.0–46.0)
HEMOGLOBIN: 10.4 g/dL — AB (ref 12.0–15.0)
HEMOGLOBIN: 10.5 g/dL — AB (ref 12.0–15.0)
MCH: 31.1 pg (ref 26.0–34.0)
MCH: 31.3 pg (ref 26.0–34.0)
MCHC: 31.3 g/dL (ref 30.0–36.0)
MCHC: 31.3 g/dL (ref 30.0–36.0)
MCV: 100 fL (ref 78.0–100.0)
MCV: 99.4 fL (ref 78.0–100.0)
PLATELETS: 95 10*3/uL — AB (ref 150–400)
Platelets: 85 10*3/uL — ABNORMAL LOW (ref 150–400)
RBC: 3.34 MIL/uL — AB (ref 3.87–5.11)
RBC: 3.36 MIL/uL — ABNORMAL LOW (ref 3.87–5.11)
RDW: 18.4 % — ABNORMAL HIGH (ref 11.5–15.5)
RDW: 18.4 % — ABNORMAL HIGH (ref 11.5–15.5)
WBC: 7.7 10*3/uL (ref 4.0–10.5)
WBC: 8.7 10*3/uL (ref 4.0–10.5)

## 2016-10-08 LAB — BASIC METABOLIC PANEL
Anion gap: 17 — ABNORMAL HIGH (ref 5–15)
BUN: 39 mg/dL — ABNORMAL HIGH (ref 6–20)
CHLORIDE: 94 mmol/L — AB (ref 101–111)
CO2: 24 mmol/L (ref 22–32)
CREATININE: 7.56 mg/dL — AB (ref 0.44–1.00)
Calcium: 9 mg/dL (ref 8.9–10.3)
GFR calc non Af Amer: 6 mL/min — ABNORMAL LOW (ref >60)
GFR, EST AFRICAN AMERICAN: 7 mL/min — AB (ref >60)
Glucose, Bld: 97 mg/dL (ref 65–99)
Potassium: 4.8 mmol/L (ref 3.5–5.1)
Sodium: 135 mmol/L (ref 135–145)

## 2016-10-08 LAB — PROCALCITONIN
PROCALCITONIN: 3.53 ng/mL
PROCALCITONIN: 4.43 ng/mL

## 2016-10-08 LAB — MRSA PCR SCREENING: MRSA by PCR: NEGATIVE

## 2016-10-08 LAB — HIV ANTIBODY (ROUTINE TESTING W REFLEX): HIV Screen 4th Generation wRfx: NONREACTIVE

## 2016-10-08 LAB — LACTIC ACID, PLASMA
LACTIC ACID, VENOUS: 1.8 mmol/L (ref 0.5–1.9)
LACTIC ACID, VENOUS: 1.9 mmol/L (ref 0.5–1.9)

## 2016-10-08 LAB — CREATININE, SERUM
CREATININE: 7.57 mg/dL — AB (ref 0.44–1.00)
GFR calc Af Amer: 7 mL/min — ABNORMAL LOW (ref >60)
GFR calc non Af Amer: 6 mL/min — ABNORMAL LOW (ref >60)

## 2016-10-08 LAB — MAGNESIUM: Magnesium: 2.3 mg/dL (ref 1.7–2.4)

## 2016-10-08 LAB — AMMONIA: Ammonia: 57 umol/L — ABNORMAL HIGH (ref 9–35)

## 2016-10-08 MED ORDER — FENTANYL CITRATE (PF) 100 MCG/2ML IJ SOLN
12.5000 ug | INTRAMUSCULAR | Status: DC | PRN
  Administered 2016-10-08: 25 ug via INTRAVENOUS
  Administered 2016-10-09 (×4): 12.5 ug via INTRAVENOUS
  Filled 2016-10-08 (×5): qty 2

## 2016-10-08 MED ORDER — ASPIRIN 325 MG PO TABS
325.0000 mg | ORAL_TABLET | Freq: Every day | ORAL | Status: DC
  Administered 2016-10-08: 325 mg via ORAL
  Filled 2016-10-08 (×3): qty 1

## 2016-10-08 MED ORDER — DEXTROSE 5 % IV SOLN
1.0000 g | INTRAVENOUS | Status: DC
  Administered 2016-10-08 – 2016-10-09 (×2): 1 g via INTRAVENOUS
  Filled 2016-10-08 (×3): qty 10

## 2016-10-08 MED ORDER — ORAL CARE MOUTH RINSE
15.0000 mL | Freq: Two times a day (BID) | OROMUCOSAL | Status: DC
  Administered 2016-10-08: 15 mL via OROMUCOSAL

## 2016-10-08 MED ORDER — ACETAMINOPHEN 325 MG PO TABS
650.0000 mg | ORAL_TABLET | ORAL | Status: DC | PRN
Start: 2016-10-08 — End: 2016-10-10

## 2016-10-08 MED ORDER — CHLORHEXIDINE GLUCONATE 0.12 % MT SOLN
15.0000 mL | Freq: Two times a day (BID) | OROMUCOSAL | Status: DC
  Administered 2016-10-08: 15 mL via OROMUCOSAL

## 2016-10-08 NOTE — Progress Notes (Signed)
  Echocardiogram 2D Echocardiogram has been performed.  Dirk Vanaman L Androw 10/08/2016, 12:25 PM

## 2016-10-08 NOTE — Progress Notes (Signed)
Palliative Care  Pt on vent and lethargic this morning. I called son. He relates that he is the next of kin, however they are not close. He asks that medical decision making be deferred to his mother's boyfriend, Wanda [name]. I called Wanda [name] three times and left two voice-messages with my name and contact information. I have not yet heard back. He was apparently present at the hospital this morning, however had left by the time I arrived and did not plan to be back in the afternoon (per care nurse). I left my contact information with Wanda [name]'s care nurse to pass along to Wanda [name] if he presents back at the hospital. Tentative plan to extubate Wanda [name] in the next 24 hours as she is weaning from vent. I will plan to meet with her tomorrow post extubation for goals of care conversation; hopefully Wanda [name] will could be present at this meeting.   Plan  F/u tomorrow post extubation. Will attempt to connect with Wanda [name] again tomorrow to involve him in a goals of care discussion.   Wanda [name] [facility] [phone] (cell, 7a-4p) [phone] (team phone for after hours and weekends)  No charge note.

## 2016-10-08 NOTE — Consult Note (Signed)
[facility] Renal Consultation Note    Indication for Consultation:  Management of ESRD/hemodialysis, anemia, hypertension/volume, and secondary hyperparathyroidism. PCP:  HPI: Wanda [name] is a 41 y.o. female with ESRD, cirrhosis, chronic hypotension was admitted with AMS and unresponsiveness with possible seizure which started at dialysis on 2/28. She was initially taken to [facility], then transferred to [facility]. No response to Narcan per notes. She was intubated to protect her airway. history taken from chart alone.   Had a similar presentation leading to admission last week 2/13-2/18. Attributed to multifactoral encephalopathy (uremic v. hepatic v. sedating meds). There was initial concern for sepsis, but no specific infection found.   Dialyzes at [facility], records pending.  Past Medical History:  Diagnosis Date  . Acute blood loss anemia 02/25/2014   Status post transfusion  . Acute renal failure ([place]) 09/2013   Pre-renal- resolved  . Anasarca 10/10/2013  . Anxiety   . Bipolar disorder ([place]) 12/04/2013   2007-SEEN IN ED FOR INVOLUNTARY COMMITMENT, UDS POS FOR AMPHETAMINES/OPIATES   . Bleeding esophageal varices ([place]) 02/28/2014   s/p banding  . C. difficile colitis 04/19/2014  . Chronic hypotension   . Cirrhosis ([place]) 10/05/13   Liver bx 11/23/13 (delayed initially due to patient refusal). c/w steatohepatitis  . Cirrhosis of liver with ascites ([place])   . Depression   . ESRD (end stage renal disease) on dialysis ([place]) 08/2014  . Folate deficiency 09/2013  . Gastroesophageal junction ulcer 09/17/2014  . GERD (gastroesophageal reflux disease)   . Hematemesis/vomiting blood 02/24/2014  . Macrocytosis 02/28/2014  . PNA (pneumonia) 10/13/2013  . SBP (spontaneous bacterial peritonitis) ([place]) 11/10/2013  . Thrombocytopenia ([place])    Hypercellular bone marrow; abundant megakaryocytes per 08/27/2014; s/p bone marrow bx   Past Surgical History:  Procedure Laterality Date  . AV  FISTULA PLACEMENT Right 11/16/2014   Procedure: Right arm Creation of arteriovenous fistula;  Surgeon: Angelia Mould, MD;  Location: [place];  Service: Vascular;  Laterality: Right;  . CENTRAL VENOUS CATHETER INSERTION Right   . COLONOSCOPY N/A 12/19/2013   SLF:NO OBVIOUS SOURCE FOR ANEMIA IDETIFIED/ONE COLON POLYP REMOVED/Small internal hemorrhoids  . ESOPHAGEAL BANDING  07/04/2014   Procedure: ESOPHAGEAL BANDING;  Surgeon: Daneil Dolin, MD;  [facility];  Service: Endoscopy;;  . ESOPHAGEAL BANDING N/A 07/24/2014   Procedure: ESOPHAGEAL BANDING (2 bands applied);  Surgeon: Danie Binder, MD;  Location: [facility];  Service: Endoscopy;  Laterality: N/A;  . ESOPHAGEAL BANDING N/A 07/23/2015   Procedure: ESOPHAGEAL BANDING;  Surgeon: Danie Binder, MD;  [facility];  Service: Endoscopy;  Laterality: N/A;  . ESOPHAGEAL BANDING N/A 03/04/2016   Procedure: ESOPHAGEAL BANDING;  Surgeon: Danie Binder, MD;  [facility];  Service: Endoscopy;  Laterality: N/A;  . ESOPHAGEAL BANDING N/A 04/30/2016   Procedure: ESOPHAGEAL BANDING;  Surgeon: Daneil Dolin, MD;  [facility];  Service: Endoscopy;  Laterality: N/A;  . ESOPHAGOGASTRODUODENOSCOPY N/A 11/14/2013   SLF:1 column of very small varices in distal esopahgus/MODERATE PORTAL GASTROPATHY IN PROXIMAL STOMACH/MODERATE erosive gastritis  . ESOPHAGOGASTRODUODENOSCOPY N/A 02/11/2014   Dr. Rourk:Esophageal varices with bleeding stigmata-status post esophageal band ligation therapy. Portal gastropathy  . ESOPHAGOGASTRODUODENOSCOPY N/A 07/04/2014   RMR: Persiting grade 2 esophageal varicies with bleeding stigmata status post band ligation. Significantly congested gastric mucosa iwith changes constistant with protal gastropathy.   . ESOPHAGOGASTRODUODENOSCOPY N/A 09/16/2014   Rehman: Single short column of varix proximal to GE junction  not large enough to be banded. Two amall ulcers at the GEJ felt to be source of GI  Bleeding but no active bleeding but no actibe bleeding noted. No therapy rendered. Portal gastropathy NO evidence of peptic ulcer diease or gastric varices.   . ESOPHAGOGASTRODUODENOSCOPY N/A 12/14/2014   SLF: Grade ! esophageal varices. 2. Moderate Portal Gastropathy  . ESOPHAGOGASTRODUODENOSCOPY N/A 03/27/2015   Procedure: ESOPHAGOGASTRODUODENOSCOPY (EGD);  Surgeon: Danie Binder, MD;  [facility];  Service: Endoscopy;  Laterality: N/A;  1045am - moved to 817 @ 11:30  . ESOPHAGOGASTRODUODENOSCOPY N/A 06/05/2015   Procedure: ESOPHAGOGASTRODUODENOSCOPY (EGD);  Surgeon: Clarene Essex, MD;  Location: [facility] END[facility]COPY;  Service: Endoscopy;  Laterality: N/A;  . ESOPHAGOGASTRODUODENOSCOPY N/A 07/23/2015   SLF:1. Grade 1 esophageal varices 2. Moderate portal hypertensive gastropathy 3. MILd non-erosive gastritis.   [name] ESOPHAGOGASTRODUODENOSCOPY N/A 03/04/2016   Dr. Oneida Alar: grade 1 varices, surveillance in Jan 2017   . ESOPHAGOGASTRODUODENOSCOPY N/A 05/21/2016   Procedure: ESOPHAGOGASTRODUODENOSCOPY (EGD);  Surgeon: Gatha Mayer, MD;  Location: [facility] ENDOSCOPY;  Service: Endoscopy;  Laterality: N/A;  . ESOPHAGOGASTRODUODENOSCOPY (EGD) WITH PROPOFOL N/A 07/24/2014   SLF:  1. 2 columns grade 2-3 varices- 2 Bands applied.  2.  Moderate gastropathy 3. Duodenal Diverticula  . ESOPHAGOGASTRODUODENOSCOPY (EGD) WITH PROPOFOL N/A 10/26/2014   RMR: 2 columns of grade 2 esophageal varices without obvious bleeding stigmata status post band ligation to complet obliteration of remaining varices.   . ESOPHAGOGASTRODUODENOSCOPY (EGD) WITH PROPOFOL N/A 04/30/2016   Procedure: ESOPHAGOGASTRODUODENOSCOPY (EGD) WITH PROPOFOL;  Surgeon: Daneil Dolin, MD;  [facility];  Service: Endoscopy;  Laterality: N/A;  . None    . PARACENTESIS  Feb 2015   1180 fluid, negative fluid analysis.   [name] PARACENTESIS  10/2013   Family History  Problem Relation Age of Onset  . Heart disease Mother   . Colon cancer Neg Hx   .  Liver disease Neg Hx    Social History:  reports that she quit smoking about 7 years ago. Her smoking use included Cigarettes. She has a 5.00 pack-year smoking history. She has never used smokeless tobacco. She reports that she does not drink alcohol or use drugs.  ROS: Unable to obtain.  Physical Exam: Vitals:   10/08/16 1200 10/08/16 1217 10/08/16 1300 10/08/16 1400  BP: (!) 91/56 (!) 92/58 (!) 84/55 (!) 94/52  Pulse: 100 100 94 (!) 104  Resp: 18 18 16 20   Temp: 99.7 F (37.6 C)  99.9 F (37.7 C) 99.9 F (37.7 C)  TempSrc:      SpO2: 100% 100% 100% 100%  Weight:      Height:         General: awake, but intubated. Head: Normocephalic, atraumatic, sclera non-icteric, mucus membranes are moist. Lungs: Coarse air movement anterior, no specific rales Heart: RRR with normal S1, S2. No murmurs, rubs, or gallops appreciated. Abdomen: Soft, non-tender. Lower extremities: Trace LE edema, SCDs in place. Dialysis Access: RUE AVF + thrill  Allergies  Allergen Reactions  . Lasix [Furosemide] Other (See Comments)    "doesn't work"  . Latex Itching   Prior to Admission medications   Medication Sig Start Date End Date Taking? Authorizing Provider  clotrimazole (LOTRIMIN) 1 % cream Apply 1 application topically 3 (three) times daily. 09/30/16  Yes Danie Binder, MD  Darbepoetin Alfa (ARANESP) 60 MCG/0.3ML SOSY injection Inject 0.3 mLs (60 mcg total) into the vein every Thursday with hemodialysis. Patient taking differently: Inject 60 mcg into the  vein every Tuesday with hemodialysis.  06/09/15  Yes Cherene Altes, MD  lactulose (CHRONULAC) 10 GM/15ML solution Take 45 mLs (30 g total) by mouth 3 (three) times daily. TAKE 15-30 ml BY MOUTH three times a day Patient taking differently: Take 10-20 g by mouth 3 (three) times daily. TAKE 15-30 ml BY MOUTH three times a day 09/27/16  Yes Cherene Altes, MD  lidocaine (XYLOCAINE) 2 % solution TAKE TWO TEASPOONSFUL (10ML) BY MOUTH BEFORE MEALS  AND AT BEDTIME TO PREVENT CHEST PAIN WHILE EATING 03/19/16  Yes Carlis Stable, NP  lidocaine-prilocaine (EMLA) cream Apply 1 application topically as needed (for dialysis treatments).   Yes Historical Provider, MD  LORazepam (ATIVAN) 1 MG tablet Take 1 mg by mouth 2 (two) times daily as needed for anxiety (and/or back spasms).   Yes Historical Provider, MD  midodrine (PROAMATINE) 10 MG tablet Take 1 tablet (10 mg total) by mouth every morning. Patient taking differently: Take 10 mg by mouth See admin instructions. Take ONLY on dialysis days 09/28/16  Yes Cherene Altes, MD  omeprazole (PRILOSEC) 20 MG capsule 1 po every morning 30 minutes prior to your first meal. Patient taking differently: Take 20 mg by mouth daily before breakfast. 1 po every morning 30 minutes prior to your first meal. 09/30/16  Yes Danie Binder, MD  Oxycodone HCl 10 MG TABS Take 0.5 tablets (5 mg total) by mouth 3 (three) times daily as needed. 1 PO TID AS NEEDED FOR PAIN Patient taking differently: Take 5-10 mg by mouth 3 (three) times daily as needed.  09/27/16  Yes Cherene Altes, MD  RENVELA 800 MG tablet Take 1600 mg by mouth 3 times daily with meals. Take 800 mg by mouth with snacks. 10/29/15  Yes Historical Provider, MD   Current Facility-Administered Medications  Medication Dose Route Frequency Provider Last Rate Last Dose  . aspirin tablet 325 mg  325 mg Oral Daily Kara Mead V, MD   325 mg at 10/08/16 1142  . cefTRIAXone (ROCEPHIN) 1 g in dextrose 5 % 50 mL IVPB  1 g Intravenous Q24H Kara Mead V, MD   1 g at 10/08/16 1143  . chlorhexidine (PERIDEX) 0.12 % solution 15 mL  15 mL Mouth Rinse BID Kara Mead V, MD   15 mL at 10/08/16 0932  . fentaNYL (SUBLIMAZE) injection 100 mcg  100 mcg Intravenous Q2H PRN Omar Person, NP   100 mcg at 10/08/16 0646  . insulin aspart (novoLOG) injection 2-6 Units  2-6 Units Subcutaneous Q4H Omar Person, NP      . lactulose (CHRONULAC) 10 GM/15ML solution 30 g  30 g Per  Tube TID Omar Person, NP   30 g at 10/08/16 0931  . MEDLINE mouth rinse  15 mL Mouth Rinse q12n4p Kara Mead V, MD   15 mL at 10/08/16 1143  . [START ON 10/09/2016] midodrine (PROAMATINE) tablet 10 mg  10 mg Oral Q M,W,F-HD Omar Person, NP      . pantoprazole sodium (PROTONIX) 40 mg/20 mL oral suspension 40 mg  40 mg Per Tube Q24H Chesley Mires, MD   40 mg at 10/08/16 0010  . phenylephrine (NEO-SYNEPHRINE) 10 mg in sodium chloride 0.9 % 250 mL (0.04 mg/mL) infusion  0-400 mcg/min Intravenous Titrated [place], MD 45 mL/hr at 10/08/16 1115 30 mcg/min at 10/08/16 1115  . propofol (DIPRIVAN) 1000 MG/100ML infusion  0-50 mcg/kg/min Intravenous Continuous Chesley Mires, MD  Stopped at 10/08/16 1020   Labs: Basic Metabolic Panel:  Recent Labs Lab 10/07/16 1705 10/07/16 2357 10/08/16 0310  NA 137  --  135  K 3.6  --  4.8  CL 94*  --  94*  CO2 25  --  24  GLUCOSE 138*  --  97  BUN 32*  --  39*  CREATININE 6.58* 7.57* 7.56*  CALCIUM 9.3  --  9.0  PHOS  --   --  7.9*   Liver Function Tests:  Recent Labs Lab 10/07/16 1705  AST 68*  ALT 31  ALKPHOS 131*  BILITOT 2.2*  PROT 8.5*  ALBUMIN 3.8    Recent Labs Lab 10/07/16 1705  LIPASE 119*    Recent Labs Lab 10/07/16 1706 10/07/16 2357  AMMONIA 11 57*   CBC:  Recent Labs Lab 10/07/16 1705 10/07/16 2357 10/08/16 0310  WBC 10.1 8.7 7.7  NEUTROABS 8.8*  --   --   HGB 11.1* 10.5* 10.4*  HCT 34.6* 33.6* 33.2*  MCV 99.7 100.0 99.4  PLT 82* 85* 95*   Cardiac Enzymes:  Recent Labs Lab 10/07/16 2357 10/08/16 0310 10/08/16 0948  TROPONINI 4.32* 6.10* 4.40*   CBG:  Recent Labs Lab 10/07/16 2207 10/07/16 2322 10/08/16 0333 10/08/16 0825 10/08/16 1159  GLUCAP 92 97 103* 89 94   Studies/Results: Ct Head Wo Contrast  Result Date: 10/07/2016 CLINICAL DATA:  Seizure activity.  End-stage renal disease EXAM: CT HEAD WITHOUT CONTRAST TECHNIQUE: Contiguous axial images were obtained from the  base of the skull through the vertex without intravenous contrast. COMPARISON:  September 22, 2016 FINDINGS: Brain: The ventricles are normal in size and configuration. There is no intracranial mass, hemorrhage, extra-axial fluid collection, or midline shift gray-white compartments are normal. No acute infarct evident. Vascular: No hyperdense vessel. Calcification is noted in the carotid siphon regions bilaterally. Skull: The bony calvarium appears intact. Sinuses/Orbits: Visualized paranasal sinuses are clear. Visualized orbits appear symmetric bilaterally. Other: Visualized mastoid air cells are clear. IMPRESSION: Vascular calcification in the carotid siphon regions. No intracranial mass, hemorrhage, or extra-axial fluid collection. Gray-white compartments are normal. Electronically Signed   By: Lowella Grip III M.D.   On: 10/07/2016 17:20   Dg Chest Port 1 View  Result Date: 10/08/2016 CLINICAL DATA:  Respiratory failure.  Shortness of breath . EXAM: PORTABLE CHEST 1 VIEW COMPARISON:  10/07/2016. FINDINGS: Endotracheal tube and NG tube in stable position. Heart size normal. Basilar subsegmental atelectasis. No pleural effusion or pneumothorax. IMPRESSION: 1. Lines and tubes in stable position. 2. Basilar subsegmental atelectasis. Tiny left pleural effusion. Chest is stable from prior exam. Electronically Signed   By: Marcello Moores  Register   On: 10/08/2016 07:01   Dg Chest Port 1 View  Result Date: 10/07/2016 CLINICAL DATA:  Respiratory failure. Check endotracheal tube position. EXAM: PORTABLE CHEST 1 VIEW COMPARISON:  10/07/2016 FINDINGS: The tip of an endotracheal tube is 5 cm above the carina. Gastric tube extends into the expected location of the stomach. The tip is excluded on this exam. The heart and mediastinal contours are stable with aortic atherosclerosis. Subsegmental atelectasis and/or scarring at the left lung base. Trace left effusion. Mild central pulmonary vascular congestion as before.  IMPRESSION: 1. Satisfactory endotracheal and gastric tube positions. 2. Aortic atherosclerosis. 3. Mild central vascular congestion. 4. Left basilar subsegmental atelectasis and/or scarring. 5. Trace left effusion. Electronically Signed   By: Ashley Royalty M.D.   On: 10/07/2016 22:44   Dg Chest Portable 1 View  Result  Date: 10/07/2016 CLINICAL DATA:  Dialysis.  Unresponsive . EXAM: PORTABLE CHEST 1 VIEW COMPARISON:  09/25/2016 . FINDINGS: Endotracheal tube noted with tip 3.7 cm above the carina. NG tube noted with tip below left hemidiaphragm. Mild cardiomegaly with mild pulmonary vascular prominence. Left base subsegmental atelectasis. Small left pleural effusion cannot be excluded. No pneumothorax . IMPRESSION: 1.  Endotracheal tube and NG tube in stable position. 2.  Cardiomegaly with mild pulmonary vascular prominence. 3. Mild left base subsegmental atelectasis. Small left pleural effusion cannot be excluded . Electronically Signed   By: Marcello Moores  Register   On: 10/07/2016 16:53    Dialysis Orders:  Records pending. Per last week's records: TTF at [facility] 3:15 hours, 3K/2.5Ca bath, EDW 56.5kg, Revaclr 1190 dialyzer, BFR 350, DFR 600 - Heparin 200U bolus + 500U/hr pump - Epogen 8000 IV q HD - Hectoral 1.36mcg IV q HD  Assessment/Plan: 1.  Encephalopathy: Work-up pending. per primary. BCx pending. Empiric Vanc/Zosyn/Ceftriaxone started. Hopefully to get extubated soon 2. ?Seizure: Per primary. 3.  ESRD: Last dialyzed Wed 2/28 (resched after missing Tues), will plan for dialysis 3/2 if stable- orders written. 4.  Hypotension/volume: Chronically low, on midodrine. Try for EDW tomorrow. 5.  Anemia: Hgb 10.4. Monitor. 6.  Metabolic bone disease: Phos high. Monitor for now since intubated. 7.  Cirrhosis: Ammonia 57 yesterday. on lactulose. Per primary. 8.  DM: On insulin, per primary. 9. Dispo- planning on palliative care consult- hosp 2/13 to 2/18 after ER visit 2/10 and 2/13   Veneta Penton,  Hershal Coria 10/08/2016, 3:09 PM  [facility] Pager: [phone]  Patient seen and examined, agree with above note with above modifications. Chronically ill female with ESRD- s/p hosp 2/13-2/18 now sent from OP hd unit to ER at [facility] then transferred here.  EEG just shows diffuse slowing- planning on extubation..  No indications for HD today, will plan for tomorrow  Corliss Parish, MD 10/08/2016

## 2016-10-08 NOTE — Progress Notes (Signed)
EEG completed, results pending. 

## 2016-10-08 NOTE — Progress Notes (Signed)
PULMONARY / CRITICAL CARE MEDICINE   Name: Wanda [name] MRN: [id number] DOB: Jan 02, 1976    ADMISSION DATE:  10/07/2016 CONSULTATION DATE:  10/07/2016   REFERRING MD:  Dr. Laverta Baltimore  CHIEF COMPLAINT: Encephalopathic   HISTORY OF PRESENT ILLNESS:   41 year old female with PMH of ESRD on HD (T/R/F), GERD, Liver Cirrhosis with ascites and H/O varices, Depression, Chronic Hypotension, and Thrombocytopenia presents to [facility] ED on 2/28 after becoming unresponsive during HD session. No response to Narcan. Intubated for airway protection. Noted to have episode of clonic jerking of upper extremities.Transferred to [facility] for further management. PCCM asked to admit.   Of note patient was admitted 2/14-2/18 after missing HD sessions for over a week, then on 2/13 during HD she became confused and combative, then progressively unresponsive. During stay intubated 2/13 and then extubated 2/15 with a paracentesis of 3.6L out on 2/14.   SUBJECTIVE:  Critically ill,  Intubated on low dose  Neo gtt  Starting to wake up on low dose propofol gtt afebrile  VITAL SIGNS: BP (!) 93/59   Pulse 96   Temp 99.3 F (37.4 C)   Resp 15   Ht 5\' 1"  (1.549 m)   Wt 127 lb 13.9 oz (58 kg)   LMP 09/21/2013   SpO2 100%   BMI 24.16 kg/m   HEMODYNAMICS:    VENTILATOR SETTINGS: Vent Mode: PSV;CPAP FiO2 (%):  [40 %-100 %] 40 % Set Rate:  [12 bmp-16 bmp] 12 bmp Vt Set:  [350 mL-380 mL] 350 mL PEEP:  [5 cmH20-8 cmH20] 8 cmH20 Pressure Support:  [5 cmH20] 5 cmH20 Plateau Pressure:  [11 cmH20-15 cmH20] 11 cmH20  INTAKE / OUTPUT: I/O last 3 completed shifts: In: 2043.8 [I.V.:952.9; NG/GT:30; IV Piggyback:1060.9] Out: 150 [Emesis/NG output:150]  PHYSICAL EXAMINATION: General:  Adult female, no distress  Neuro:  moves extremities purposeful , pupils intact, +gag HEENT:  ETT in place  Cardiovascular:   NI S1/S2, no MRG Lungs: non-labored, no wheezes/crackles   Abdomen:  Distended, active bowel sounds,  umbilical hernia  Musculoskeletal:  No acute  Skin:  Warm, dry, intact   LABS:  BMET  Recent Labs Lab 10/07/16 1705 10/07/16 2357 10/08/16 0310  NA 137  --  135  K 3.6  --  4.8  CL 94*  --  94*  CO2 25  --  24  BUN 32*  --  39*  CREATININE 6.58* 7.57* 7.56*  GLUCOSE 138*  --  97    Electrolytes  Recent Labs Lab 10/07/16 1705 10/08/16 0310  CALCIUM 9.3 9.0  MG  --  2.3  PHOS  --  7.9*    CBC  Recent Labs Lab 10/07/16 1705 10/07/16 2357 10/08/16 0310  WBC 10.1 8.7 7.7  HGB 11.1* 10.5* 10.4*  HCT 34.6* 33.6* 33.2*  PLT 82* 85* 95*    Coag's  Recent Labs Lab 10/07/16 1705  INR 1.00    Sepsis Markers  Recent Labs Lab 10/07/16 1716 10/07/16 2357 10/08/16 0310 10/08/16 0321  LATICACIDVEN 2.46* 1.9  --  1.8  PROCALCITON  --  3.53 4.43  --     ABG  Recent Labs Lab 10/07/16 2110 10/07/16 2327 10/08/16 0355  PHART 7.512* 7.534* 7.490*  PCO2ART 28.1* 32.1 29.8*  PO2ART 169.0* 185.0* 126*    Liver Enzymes  Recent Labs Lab 10/07/16 1705  AST 68*  ALT 31  ALKPHOS 131*  BILITOT 2.2*  ALBUMIN 3.8    Cardiac Enzymes  Recent Labs Lab  10/07/16 2357 10/08/16 0310 10/08/16 0948  TROPONINI 4.32* 6.10* 4.40*    Glucose  Recent Labs Lab 10/07/16 2207 10/07/16 2322 10/08/16 0333 10/08/16 0825  GLUCAP 92 97 103* 89    Imaging Ct Head Wo Contrast  Result Date: 10/07/2016 CLINICAL DATA:  Seizure activity.  End-stage renal disease EXAM: CT HEAD WITHOUT CONTRAST TECHNIQUE: Contiguous axial images were obtained from the base of the skull through the vertex without intravenous contrast. COMPARISON:  September 22, 2016 FINDINGS: Brain: The ventricles are normal in size and configuration. There is no intracranial mass, hemorrhage, extra-axial fluid collection, or midline shift gray-white compartments are normal. No acute infarct evident. Vascular: No hyperdense vessel. Calcification is noted in the carotid siphon regions bilaterally.  Skull: The bony calvarium appears intact. Sinuses/Orbits: Visualized paranasal sinuses are clear. Visualized orbits appear symmetric bilaterally. Other: Visualized mastoid air cells are clear. IMPRESSION: Vascular calcification in the carotid siphon regions. No intracranial mass, hemorrhage, or extra-axial fluid collection. Gray-white compartments are normal. Electronically Signed   By: Lowella Grip III M.D.   On: 10/07/2016 17:20   Dg Chest Port 1 View  Result Date: 10/08/2016 CLINICAL DATA:  Respiratory failure.  Shortness of breath . EXAM: PORTABLE CHEST 1 VIEW COMPARISON:  10/07/2016. FINDINGS: Endotracheal tube and NG tube in stable position. Heart size normal. Basilar subsegmental atelectasis. No pleural effusion or pneumothorax. IMPRESSION: 1. Lines and tubes in stable position. 2. Basilar subsegmental atelectasis. Tiny left pleural effusion. Chest is stable from prior exam. Electronically Signed   By: Marcello Moores  Register   On: 10/08/2016 07:01   Dg Chest Port 1 View  Result Date: 10/07/2016 CLINICAL DATA:  Respiratory failure. Check endotracheal tube position. EXAM: PORTABLE CHEST 1 VIEW COMPARISON:  10/07/2016 FINDINGS: The tip of an endotracheal tube is 5 cm above the carina. Gastric tube extends into the expected location of the stomach. The tip is excluded on this exam. The heart and mediastinal contours are stable with aortic atherosclerosis. Subsegmental atelectasis and/or scarring at the left lung base. Trace left effusion. Mild central pulmonary vascular congestion as before. IMPRESSION: 1. Satisfactory endotracheal and gastric tube positions. 2. Aortic atherosclerosis. 3. Mild central vascular congestion. 4. Left basilar subsegmental atelectasis and/or scarring. 5. Trace left effusion. Electronically Signed   By: Ashley Royalty M.D.   On: 10/07/2016 22:44   Dg Chest Portable 1 View  Result Date: 10/07/2016 CLINICAL DATA:  Dialysis.  Unresponsive . EXAM: PORTABLE CHEST 1 VIEW COMPARISON:   09/25/2016 . FINDINGS: Endotracheal tube noted with tip 3.7 cm above the carina. NG tube noted with tip below left hemidiaphragm. Mild cardiomegaly with mild pulmonary vascular prominence. Left base subsegmental atelectasis. Small left pleural effusion cannot be excluded. No pneumothorax . IMPRESSION: 1.  Endotracheal tube and NG tube in stable position. 2.  Cardiomegaly with mild pulmonary vascular prominence. 3. Mild left base subsegmental atelectasis. Small left pleural effusion cannot be excluded . Electronically Signed   By: Marcello Moores  Register   On: 10/07/2016 16:53     STUDIES:  CXR 2/28 > Cardiomegaly with mild pulmonary vascular prominence, mild left base subsegmental atelectasis, small left pleural effusion can not be excluded  CT Head 2/28 > Vascular calcification in the carotid siphon regions. No intracranial mass, hemorrhage, or extra-axial fluid collections, gray-white compartments are normal  EEG 2/28 >>  CULTURES: Blood 2/28 >> Urinalysis 2/28 >>  ANTIBIOTICS: Vancomycin 2/28 in ED Zosyn 2/28 in ED   SIGNIFICANT EVENTS: 2/28 > Presents to ED - during HD went unresponsive  LINES/TUBES: ETT 2/28 >>  DISCUSSION: 41 year old female with PMH of liver cirrhosis and ESRD on HD T/R/S presents to ED on 2/28 after becoming unresponsive during HD session with jerking movements  ASSESSMENT / PLAN:  PULMONARY A: Inability to protect airway secondary to encephalopathy  P:   Vent settings reviewed & adjusted VAP prevention protocol  Daily SBT and WUA   CARDIOVASCULAR A:  Chronic Hypotension  Demand ischemia -trop down trending P:  Cardiac Monitoring  Await echo Continue home Midodrine on HD Days  Wean Neo for Systolic Goal [id number]  RENAL A:   ESRD (T/R/S) P:   Consulted Nephrology for HD tomorrow Trend BMP Replace electrolytes as needed   GASTROINTESTINAL A:   Hepatic Cirrhosis with Ascites  H/O Varices  P:   Continue Home Lactulose - titrate to 2-3 soft  BM/d NPO PPI  HEMATOLOGIC A:   Chronic Thrombocytopenia  Chronic Anemia  P:  Trend CBC  SCDS will hold SQ Heparin   INFECTIOUS A:   Elevated Lactic Acid in setting of ESRD and ?post-seizure , resolved Doubt infection -Afebrile, no WBC  P:   Trend Fever and WBC curve  Monitor off antibiotics    ENDOCRINE A:   Hyperglycemia  P:   CBG monitoring  SSI  NEUROLOGIC A:   Acute Metabolic vs Hepatic Encephalopathy Seizures vs Tremors   P:   Wean Propofol to achieve RASS Fentanyl PRN  EEG pending  RASS goal: 0/-1   FAMILY  - Updates: No family at bedside    - Inter-disciplinary family meet or Palliative Care meeting due by: 3/7  My CC Time: 41 minutes   Kara Mead MD. [facility]. [place] [facility] Pager [phone] If no response call [phone]   10/08/2016

## 2016-10-08 NOTE — Progress Notes (Signed)
[facility] Progress Note Patient Name: Wanda [name] DOB: 1976/06/14 MRN: [id number]   Date of Service  10/08/2016  HPI/Events of Note  ABG and event: Respiratory alkalosis. Patient admitted with seizures, intubated for airway protection.   eICU Interventions  Will adjust the vent to decrease minute ventilation.  Rpt sng.         [place] 10/08/2016, 1:55 AM

## 2016-10-08 NOTE — Progress Notes (Signed)
Neo turned off. Patient has history of soft blood pressures per chart and family. Spoke with Elink MD about history and current soft pressures. Remains off and told to monitor mental status and future pressures. Patient currently A&O x4, RN will continue to monitor for changes in mentation.

## 2016-10-08 NOTE — Procedures (Signed)
Extubation Procedure Note  Patient Details:   Name: Wanda [name] DOB: [birth year] MRN: [id number]   Airway Documentation:     Evaluation  O2 sats: stable throughout Complications: No apparent complications Patient did tolerate procedure well.    Pt extubated per MD order, pt had +cuff leak, placed on 4L [place] tolerating well, no distress noted at this time.   Ciro Backer 10/08/2016, 4:09 PM

## 2016-10-08 NOTE — Progress Notes (Signed)
eLink Physician-Brief Progress Note Patient Name: Wanda [name] DOB: [birth year] MRN: [id number]   Date of Service  10/08/2016  HPI/Events of Note  Mild pain  eICU Interventions  apap prn Low dose fentanyl prn     Intervention Category Intermediate Interventions: Pain - evaluation and management  Simonne Maffucci 10/08/2016, 6:11 PM

## 2016-10-08 NOTE — Procedures (Signed)
ELECTROENCEPHALOGRAM REPORT  Date of Study: 10/08/2016  Patient's Name: Wanda [name] MRN: [id number] Date of Birth: 07-Mar-1976  Referring Provider: Dr. Chesley Mires  Clinical History: This is a 41 year old woman with altered mental status, noted to have an episode of clonic jerking of the upper extremities.  Medications: aspirin tablet 325 mg  cefTRIAXone (ROCEPHIN) 1 g in dextrose 5 % 50 mL IVPB  insulin aspart (novoLOG) injection 2-6 Units  midodrine (PROAMATINE) tablet 10 mg  pantoprazole sodium (PROTONIX) 40 mg/20 mL oral suspension 40 mg  phenylephrine (NEO-SYNEPHRINE) 10 mg in sodium chloride 0.9 % 250 mL (0.04 mg/mL) infusion   Technical Summary: A multichannel digital EEG recording measured by the international 10-20 system with electrodes applied with paste and impedances below 5000 ohms performed as portable with EKG monitoring in an unresponsive patient.  Hyperventilation and photic stimulation were not performed.  The digital EEG was referentially recorded, reformatted, and digitally filtered in a variety of bipolar and referential montages for optimal display.   Description: The patient is unresponsive during the recording. Sedation turned off 1 hour prior to EEG. There is no clear posterior dominant rhythm. The background consists of a large amount of diffuse 4-5 Hz theta and 2-3 Hz delta slowing with a mixture of alpha and beta activity. Normal sleep architecture is not seen. Hyperventilation and photic stimulation were not performed. There were no focal abnormalities, no focal slowing, epileptiform discharges, or electrographic seizures seen.  EKG lead was unremarkable.  Impression: This EEG is abnormal due to moderate diffuse slowing of the background.  Clinical Correlation of the above findings indicates diffuse cerebral dysfunction that is non-specific in etiology and can be seen with hypoxic/ischemic injury, toxic/metabolic encephalopathies, or medication effect.  The absence of epileptiform discharges does not rule out a clinical diagnosis of epilepsy.  Clinical correlation is advised.   Ellouise Newer, M.D.

## 2016-10-08 NOTE — Progress Notes (Signed)
[facility] Progress Note Patient Name: Wanda [name] DOB: 1976/01/02 MRN: [id number]   Date of Service  10/08/2016  HPI/Events of Note  RN calls as troponin is elevated at 4++ Patient admitted with seizures, intubated for airway protection.   EKG done at the ER appeared at baseline. Some ST depression in V3-V6.   No recent echo.   Patient is  sedated, not in distress, comfortable.  Blood pressure 81/55, heart rate 95, respiratory 20, sats 100%.   Elevated troponin could be related to seizures.  Patient has liver cirrhosis, chronic thrombocytopenia.   eICU Interventions  Observe troponin. Do serial troponins. If next Troponin is higher, plan to start heparin drip. Currently, we'll hold off on the heparin drip.   Suggest echo in am.      Intervention Category Major Interventions: Other: Evaluation Type: Other  Kimberly 10/08/2016, 1:41 AM

## 2016-10-09 ENCOUNTER — Inpatient Hospital Stay (HOSPITAL_COMMUNITY): Payer: Medicare Other

## 2016-10-09 DIAGNOSIS — I255 Ischemic cardiomyopathy: Secondary | ICD-10-CM

## 2016-10-09 DIAGNOSIS — R748 Abnormal levels of other serum enzymes: Secondary | ICD-10-CM

## 2016-10-09 DIAGNOSIS — Z515 Encounter for palliative care: Secondary | ICD-10-CM

## 2016-10-09 DIAGNOSIS — G9340 Encephalopathy, unspecified: Secondary | ICD-10-CM

## 2016-10-09 DIAGNOSIS — I429 Cardiomyopathy, unspecified: Secondary | ICD-10-CM

## 2016-10-09 DIAGNOSIS — Z7189 Other specified counseling: Secondary | ICD-10-CM | POA: Insufficient documentation

## 2016-10-09 DIAGNOSIS — R7989 Other specified abnormal findings of blood chemistry: Secondary | ICD-10-CM

## 2016-10-09 DIAGNOSIS — J9600 Acute respiratory failure, unspecified whether with hypoxia or hypercapnia: Secondary | ICD-10-CM

## 2016-10-09 DIAGNOSIS — R778 Other specified abnormalities of plasma proteins: Secondary | ICD-10-CM | POA: Diagnosis present

## 2016-10-09 LAB — GLUCOSE, CAPILLARY
GLUCOSE-CAPILLARY: 90 mg/dL (ref 65–99)
GLUCOSE-CAPILLARY: 114 mg/dL — AB (ref 65–99)
GLUCOSE-CAPILLARY: 129 mg/dL — AB (ref 65–99)
Glucose-Capillary: 88 mg/dL (ref 65–99)

## 2016-10-09 LAB — CBC
HEMATOCRIT: 28.2 % — AB (ref 36.0–46.0)
Hemoglobin: 8.9 g/dL — ABNORMAL LOW (ref 12.0–15.0)
MCH: 31.7 pg (ref 26.0–34.0)
MCHC: 31.6 g/dL (ref 30.0–36.0)
MCV: 100.4 fL — ABNORMAL HIGH (ref 78.0–100.0)
PLATELETS: 75 10*3/uL — AB (ref 150–400)
RBC: 2.81 MIL/uL — ABNORMAL LOW (ref 3.87–5.11)
RDW: 18.2 % — ABNORMAL HIGH (ref 11.5–15.5)
WBC: 5.6 10*3/uL (ref 4.0–10.5)

## 2016-10-09 LAB — BASIC METABOLIC PANEL
Anion gap: 19 — ABNORMAL HIGH (ref 5–15)
BUN: 45 mg/dL — ABNORMAL HIGH (ref 6–20)
CALCIUM: 9 mg/dL (ref 8.9–10.3)
CO2: 21 mmol/L — AB (ref 22–32)
CREATININE: 9.12 mg/dL — AB (ref 0.44–1.00)
Chloride: 96 mmol/L — ABNORMAL LOW (ref 101–111)
GFR, EST AFRICAN AMERICAN: 5 mL/min — AB (ref >60)
GFR, EST NON AFRICAN AMERICAN: 5 mL/min — AB (ref >60)
GLUCOSE: 81 mg/dL (ref 65–99)
Potassium: 3.7 mmol/L (ref 3.5–5.1)
Sodium: 136 mmol/L (ref 135–145)

## 2016-10-09 LAB — MAGNESIUM: Magnesium: 2.3 mg/dL (ref 1.7–2.4)

## 2016-10-09 LAB — PHOSPHORUS: PHOSPHORUS: 8.9 mg/dL — AB (ref 2.5–4.6)

## 2016-10-09 LAB — PROCALCITONIN: PROCALCITONIN: 3.51 ng/mL

## 2016-10-09 MED ORDER — SODIUM CHLORIDE 0.9 % IV SOLN: 100.0000 mL | INTRAVENOUS | Status: DC | PRN

## 2016-10-09 MED ORDER — SEVELAMER CARBONATE 800 MG PO TABS
1600.0000 mg | ORAL_TABLET | Freq: Three times a day (TID) | ORAL | Status: DC
  Administered 2016-10-09 – 2016-10-10 (×3): 1600 mg via ORAL
  Filled 2016-10-09 (×4): qty 2

## 2016-10-09 MED ORDER — ALTEPLASE 2 MG IJ SOLR
2.0000 mg | Freq: Once | INTRAMUSCULAR | Status: DC | PRN
  Filled 2016-10-09: qty 2

## 2016-10-09 MED ORDER — SUCRALFATE 1 GM/10ML PO SUSP
1.0000 g | Freq: Three times a day (TID) | ORAL | Status: DC
  Administered 2016-10-10: 1 g via ORAL
  Filled 2016-10-09 (×2): qty 10

## 2016-10-09 MED ORDER — LIDOCAINE-PRILOCAINE 2.5-2.5 % EX CREA
1.0000 "application " | TOPICAL_CREAM | CUTANEOUS | Status: DC | PRN
  Filled 2016-10-09: qty 5

## 2016-10-09 MED ORDER — LIDOCAINE HCL (PF) 1 % IJ SOLN: 5.0000 mL | INTRAMUSCULAR | Status: DC | PRN

## 2016-10-09 MED ORDER — HEPARIN SODIUM (PORCINE) 1000 UNIT/ML DIALYSIS
1000.0000 [IU] | INTRAMUSCULAR | Status: DC | PRN
  Filled 2016-10-09: qty 1

## 2016-10-09 MED ORDER — OXYCODONE HCL 5 MG PO TABS
5.0000 mg | ORAL_TABLET | Freq: Three times a day (TID) | ORAL | Status: DC | PRN
  Administered 2016-10-09 – 2016-10-10 (×3): 5 mg via ORAL
  Filled 2016-10-09 (×3): qty 1

## 2016-10-09 MED ORDER — MIDODRINE HCL 5 MG PO TABS
10.0000 mg | ORAL_TABLET | Freq: Every day | ORAL | Status: DC
  Administered 2016-10-09 – 2016-10-10 (×2): 10 mg via ORAL
  Filled 2016-10-09 (×2): qty 2

## 2016-10-09 MED ORDER — SUCRALFATE 1 G PO TABS
1.0000 g | ORAL_TABLET | Freq: Three times a day (TID) | ORAL | Status: DC
  Administered 2016-10-09: 1 g via ORAL
  Filled 2016-10-09 (×4): qty 1

## 2016-10-09 MED ORDER — PENTAFLUOROPROP-TETRAFLUOROETH EX AERO: 1.0000 "application " | INHALATION_SPRAY | CUTANEOUS | Status: DC | PRN

## 2016-10-09 MED ORDER — DARBEPOETIN ALFA 200 MCG/0.4ML IJ SOSY
200.0000 ug | PREFILLED_SYRINGE | INTRAMUSCULAR | Status: DC
  Filled 2016-10-09: qty 0.4

## 2016-10-09 MED FILL — Phenylephrine HCl Inj 10 MG/ML: INTRAMUSCULAR | Qty: 1 | Status: AC

## 2016-10-09 MED FILL — Dextrose Inj 5%: INTRAVENOUS | Qty: 250 | Status: AC

## 2016-10-09 NOTE — Consult Note (Signed)
Consultation Note Date: 10/09/2016   Patient Name: Wanda [name]  DOB: [birth year]  MRN: [id number]  Age / Sex: 41 y.o., female  PCP: [name], MD Referring Physician: Chesley Mires, MD  Reason for Consultation: Establishing goals of care  HPI/Patient Profile: 41 y.o. female  with past medical history of ESRD on dialysis, NASH with cirrhosis, chronic hypotension, thrombocytopenia, DM2, and Depression who presented to the ED after becoming unresponsive during hemodialysis. She was subsequently admitted on 10/07/2016 for evaluation. She was intubated for airway protection. Concern for both sepsis and seizures. Sepsis felt unlikely as afebrile and WBC normal. EEG with moderate diffuse slowing of the background (non-specific in etiology). Extubated 3/1. At this point unresponsiveness etiology remains unclear, ?acute metabolic versus hepatic encephalopathy versus seizure. Palliative was consulted for goals of care in light of her ESRD, cirrhosis, and multiple admissions.   Clinical Assessment and Goals of Care: I met with Layce and her boyfriend, [name] at her bedside. I introduced myself and explained that I was asked to visit her to help manage her symptoms and stress associated with her medical issues. I also wanted to discuss what was important to her, and how to best align our care approach to her values. She was agreeable to talk with me.  In our conversation Charrie was clearly very confused. She struggled to stay on topic and required frequent conversational cues. She also had very poor insight into her health. She feels she has been doing generally well at home, she has no issues going to dialysis, and she is "actually quite healthy" except for her kidneys and liver. I brought up the missed dialysis appointments prior to the last hospitalization, which she explained was due to illness. She was not able to tell me why she was at the hospital currently, nor  could she relate why she was refusing her medication this morning. She does deny confusion, and [name] states she is at her baseline despite our disjointed conversation and her odd responses.   In terms of goals of care, Maribell was able to explain that she found joy in being at home, cooking occasionally, and watching comedy movies. Dialysis has certainly "made the week harder," but she knows she needs it to live longer. I asked about her vision for her life in the next few months or years, and she said she lived day to day and did not want to think about or talk about the future. I asked if she was concerned about something related to the future, but she reiterated that she did not want to talk about it. I also tried to elicit if quality of life or quantity of life was her focus, but she wouldn't answer me. She was also quick to tell me that she did not agree to "any living will paper" and that she should be "kept alive no matter what." She did not want to discuss either of these things further.  Finally, I did address the purpose of establishing a HCPOA. I explained that legally her son would be her decision maker if she were unable to communicate. When I spoke with him over the phone he did not feel he should make decisions, and felt [name] should. I told this to Cristela, and asked her preference for a decision maker, making it clear that it could be [name], or someone else she trusted to be her voice. She asked to think on this further and did not want to talk about it further with me.  Primary Decision Maker PATIENT  SUMMARY OF RECOMMENDATIONS    Full code, full scope interventions  Chaplain consult placed to help with HCPOA paperwork  If pt starts refusing treatment or intending to leave AMA, may need to consider a psych consult to determine capacity for medical decision making  Attempted to discuss pain status, but she denies pain, has no complaints or concerns about pain medication, and plans to  resume home medication on discharge. Of note, I did check [place] Database for Controlled Substances. Her prescribed/dispenses is appropriate. Medication managed by Dr. Barney Drain (GI).  Code Status/Advance Care Planning:  Full code  Additional Recommendations (Limitations, Scope, Preferences):  Full Scope Treatment  Psycho-social/Spiritual:   Desire for further Chaplaincy support:no  Prognosis:   Unable to determine  Discharge Planning: Home with Home Health      Primary Diagnoses: Present on Admission: **None**   I have reviewed the medical record, interviewed the patient and family, and examined the patient. The following aspects are pertinent.  Past Medical History:  Diagnosis Date  . Acute blood loss anemia 02/25/2014   Status post transfusion  . Acute renal failure ([place]) 09/2013   Pre-renal- resolved  . Anasarca 10/10/2013  . Anxiety   . Bipolar disorder ([place]) 12/04/2013   2007-SEEN IN ED FOR INVOLUNTARY COMMITMENT, UDS POS FOR AMPHETAMINES/OPIATES   . Bleeding esophageal varices ([place]) 02/28/2014   s/p banding  . C. difficile colitis 04/19/2014  . Chronic hypotension   . Cirrhosis ([place]) 10/05/13   Liver bx 11/23/13 (delayed initially due to patient refusal). c/w steatohepatitis  . Cirrhosis of liver with ascites ([place])   . Depression   . ESRD (end stage renal disease) on dialysis ([place]) 08/2014  . Folate deficiency 09/2013  . Gastroesophageal junction ulcer 09/17/2014  . GERD (gastroesophageal reflux disease)   . Hematemesis/vomiting blood 02/24/2014  . Macrocytosis 02/28/2014  . PNA (pneumonia) 10/13/2013  . SBP (spontaneous bacterial peritonitis) ([place]) 11/10/2013  . Thrombocytopenia ([place])    Hypercellular bone marrow; abundant megakaryocytes per 08/27/2014; s/p bone marrow bx   Social History   Social History  . Marital status: Single    Spouse name: N/A  . Number of children: 1  . Years of education: N/A   Occupational History  . unemployed    Social History Main  Topics  . Smoking status: Former Smoker    Packs/day: 0.25    Years: 20.00    Types: Cigarettes    Quit date: 11/05/2008  . Smokeless tobacco: Never Used     Comment: Never really smoked much  . Alcohol use No  . Drug use: No  . Sexual activity: Not Currently    Partners: Male    Birth control/ protection: None   Other Topics Concern  . None   Social History Narrative  . None   Family History  Problem Relation Age of Onset  . Heart disease Mother   . Colon cancer Neg Hx   . Liver disease Neg Hx    Scheduled Meds: . aspirin  325 mg Oral Daily  . cefTRIAXone (ROCEPHIN)  IV  1 g Intravenous Q24H  . [START ON 10/10/2016] darbepoetin (ARANESP) injection - DIALYSIS  200 mcg Intravenous Q Sat-HD  . insulin aspart  2-6 Units Subcutaneous Q4H  . lactulose  30 g Per Tube TID  . midodrine  10 mg Oral Daily  . pantoprazole sodium  40 mg Per Tube Q24H  . sevelamer carbonate  1,600 mg Oral TID WC   Continuous  Infusions: . phenylephrine (NEO-SYNEPHRINE) Adult infusion Stopped (10/08/16 1435)  . propofol (DIPRIVAN) infusion Stopped (10/08/16 1020)   PRN Meds:.acetaminophen, fentaNYL (SUBLIMAZE) injection Allergies  Allergen Reactions  . Lasix [Furosemide] Other (See Comments)    "doesn't work"  . Latex Itching   Review of Systems  Constitutional: Positive for fatigue. Negative for activity change, chills and unexpected weight change.  HENT: Positive for sore throat. Negative for congestion, hearing loss and trouble swallowing.   Eyes: Negative for visual disturbance.  Respiratory: Negative for chest tightness and shortness of breath.   Cardiovascular: Negative for chest pain.  Gastrointestinal: Positive for diarrhea. Negative for abdominal distention (denies despite being asked multiple times), abdominal pain, anal bleeding, constipation, nausea and vomiting.  Musculoskeletal: Negative for back pain and neck pain.  Skin: Negative for color change.  Neurological: Positive for  weakness. Negative for dizziness, speech difficulty and light-headedness.  Hematological: Bruises/bleeds easily.  Psychiatric/Behavioral: Negative for confusion [name] at bedside also feels she is at her baseline) and sleep disturbance. The patient is not nervous/anxious.    Physical Exam  Constitutional: She has a sickly appearance.  HENT:  Head: Normocephalic and atraumatic.  Mouth/Throat: No oropharyngeal exudate.  Eyes: EOM are normal.  Neck: Normal range of motion.  Cardiovascular:  Declined to let me listen to her heart or lungs  Pulmonary/Chest: Effort normal. No respiratory distress.  Abdominal:  Declined to let me assess her abd  Musculoskeletal: Normal range of motion.  Neurological: She is alert.  Oriented to person and place. Confused on time and situation. Couldn't follow questions or conversation, struggled to explain things, required frequent conversational cues to stay on topic.  Skin: Skin is warm and dry. Bruising noted. There is pallor.  Psychiatric: Thought content normal. Her speech is delayed and tangential. She is slowed. Cognition and memory are impaired. She expresses inappropriate judgment.  Flat affect.    Vital Signs: BP (!) 83/47   Pulse 95   Temp 98.3 F (36.8 C) (Oral)   Resp 17   Ht 5' 1" (1.549 m)   Wt 58.2 kg (128 lb 3.2 oz)   LMP 09/21/2013   SpO2 100%   BMI 24.22 kg/m  Pain Assessment: 0-10   Pain Score: Asleep   SpO2: SpO2: 100 % O2 Device:SpO2: 100 % O2 Flow Rate: .O2 Flow Rate (L/min): 2 L/min  IO: Intake/output summary:  Intake/Output Summary (Last 24 hours) at 10/09/16 0843 Last data filed at 10/09/16 0600  Gross per 24 hour  Intake           699.96 ml  Output              220 ml  Net           479.96 ml    LBM: Last BM Date:  (PTA ) Baseline Weight: Weight: 54.5 kg (120 lb 2.4 oz) Most recent weight: Weight: 58.2 kg (128 lb 3.2 oz)     Palliative Assessment/Data: PPS 50-60%    Time Total: 70 minutes Greater than  50%  of this time was spent counseling and coordinating care related to the above assessment and plan.  Signed by: Charlynn Court, NP Palliative Medicine Team Pager # [phone] (M-F 7a-5p) Team Phone # [phone] (Nights/Weekends)

## 2016-10-09 NOTE — Progress Notes (Signed)
Subjective:  Extubated- was on pressors briefly but now off - BP in the 80's to 90's  had 5 stools.  Seems to be mentating slowly but dont know baseline- wants [name] soft diet for breakfast   Objective Vital signs in last 24 hours: Vitals:   10/09/16 0615 10/09/16 0630 10/09/16 0645 10/09/16 0700  BP: (!) 92/56 (!) 84/51 (!) 81/54 (!) 83/47  Pulse: 93 87 88 95  Resp: 18 17 17 17   Temp: 97.9 F (36.6 C) 97.9 F (36.6 C) 97.9 F (36.6 C) 98.1 F (36.7 C)  TempSrc:      SpO2: 100% 100% 100% 100%  Weight:      Height:       Weight change: 3.651 kg (8 lb 0.8 oz)  Intake/Output Summary (Last 24 hours) at 10/09/16 0748 Last data filed at 10/08/16 1600  Gross per 24 hour  Intake           820.19 ml  Output              200 ml  Net           620.19 ml    Dialysis Orders:  TTF at [facility] 3:15 hours, 3K/2.5Ca bath, EDW 56.5kg, Revaclr 1190 dialyzer, BFR 350, DFR 600 - Heparin 200U bolus + 500U/hr pump - Epogen 8000 IV q HD - Hectoral 1.23mcg IV q HD  Assessment/Plan: 1.  Encephalopathy: Work-up pending. per primary. BCx pending. Empiric Vanc/Zosyn/Ceftriaxone- now weaned to rocephin only  started. Now extubated and improved ?Seizure: Per primary. 2.  ESRD: Last dialyzed Wed 2/28 (resched after missing Tues), will now plan for HD tomorrow (Saturday) to get her back on schedule 3.  Hypotension/volume: Chronically low, on midodrine. Try for EDW with next HD- low BP limits- does not seem too overloaded at this time- may need longer dialysis time to achieve 4.  Anemia: Hgb 10.4--8.9 this AM. Last ferritin high- liver dz- will add ESA  5.  Metabolic bone disease: Phos high. Start renvela 6.  Cirrhosis: Ammonia 57 yesterday. on lactulose. Per primary. 7.  DM: On insulin, per primary. 8. Dispo- seems to be having [name] little FTT- planning on palliative care consult- hosp 2/13 to 2/18 after ER visit 2/10 and 2/13.  For now- mobilize   Wanda [name]    Labs: Basic Metabolic  Panel:  Recent Labs Lab 10/07/16 1705 10/07/16 2357 10/08/16 0310 10/09/16 0348  NA 137  --  135 136  K 3.6  --  4.8 3.7  CL 94*  --  94* 96*  CO2 25  --  24 21*  GLUCOSE 138*  --  97 81  BUN 32*  --  39* 45*  CREATININE 6.58* 7.57* 7.56* 9.12*  CALCIUM 9.3  --  9.0 9.0  PHOS  --   --  7.9* 8.9*   Liver Function Tests:  Recent Labs Lab 10/07/16 1705  AST 68*  ALT 31  ALKPHOS 131*  BILITOT 2.2*  PROT 8.5*  ALBUMIN 3.8    Recent Labs Lab 10/07/16 1705  LIPASE 119*    Recent Labs Lab 10/07/16 1706 10/07/16 2357  AMMONIA 11 57*   CBC:  Recent Labs Lab 10/07/16 1705 10/07/16 2357 10/08/16 0310 10/09/16 0348  WBC 10.1 8.7 7.7 5.6  NEUTROABS 8.8*  --   --   --   HGB 11.1* 10.5* 10.4* 8.9*  HCT 34.6* 33.6* 33.2* 28.2*  MCV 99.7 100.0 99.4 100.4*  PLT 82* 85* 95* 75*   Cardiac  Enzymes:  Recent Labs Lab 10/07/16 2357 10/08/16 0310 10/08/16 0948  TROPONINI 4.32* 6.10* 4.40*   CBG:  Recent Labs Lab 10/08/16 1159 10/08/16 1600 10/08/16 2005 10/08/16 2335 10/09/16 0328  GLUCAP 94 93 90 86 88    Iron Studies: No results for input(s): IRON, TIBC, TRANSFERRIN, FERRITIN in the last 72 hours. Studies/Results: Ct Head Wo Contrast  Result Date: 10/07/2016 CLINICAL DATA:  Seizure activity.  End-stage renal disease EXAM: CT HEAD WITHOUT CONTRAST TECHNIQUE: Contiguous axial images were obtained from the base of the skull through the vertex without intravenous contrast. COMPARISON:  September 22, 2016 FINDINGS: Brain: The ventricles are normal in size and configuration. There is no intracranial mass, hemorrhage, extra-axial fluid collection, or midline shift gray-white compartments are normal. No acute infarct evident. Vascular: No hyperdense vessel. Calcification is noted in the carotid siphon regions bilaterally. Skull: The bony calvarium appears intact. Sinuses/Orbits: Visualized paranasal sinuses are clear. Visualized orbits appear symmetric bilaterally.  Other: Visualized mastoid air cells are clear. IMPRESSION: Vascular calcification in the carotid siphon regions. No intracranial mass, hemorrhage, or extra-axial fluid collection. Gray-white compartments are normal. Electronically Signed   By: Lowella Grip III M.D.   On: 10/07/2016 17:20   Dg Chest Port 1 View  Result Date: 10/09/2016 CLINICAL DATA:  Acute respiratory failure EXAM: PORTABLE CHEST 1 VIEW COMPARISON:  10/08/2016 FINDINGS: Endotracheal tube and NG tube removed. Improved aeration of the lung bases. Mild residual left lower lobe atelectasis or infiltrate remains. No effusion IMPRESSION: Improved aeration in the lung bases following extubation. Mild residual left lower lobe airspace disease. Electronically Signed   By: Franchot Gallo M.D.   On: 10/09/2016 07:18   Dg Chest Port 1 View  Result Date: 10/08/2016 CLINICAL DATA:  Respiratory failure.  Shortness of breath . EXAM: PORTABLE CHEST 1 VIEW COMPARISON:  10/07/2016. FINDINGS: Endotracheal tube and NG tube in stable position. Heart size normal. Basilar subsegmental atelectasis. No pleural effusion or pneumothorax. IMPRESSION: 1. Lines and tubes in stable position. 2. Basilar subsegmental atelectasis. Tiny left pleural effusion. Chest is stable from prior exam. Electronically Signed   By: Marcello Moores  Register   On: 10/08/2016 07:01   Dg Chest Port 1 View  Result Date: 10/07/2016 CLINICAL DATA:  Respiratory failure. Check endotracheal tube position. EXAM: PORTABLE CHEST 1 VIEW COMPARISON:  10/07/2016 FINDINGS: The tip of an endotracheal tube is 5 cm above the carina. Gastric tube extends into the expected location of the stomach. The tip is excluded on this exam. The heart and mediastinal contours are stable with aortic atherosclerosis. Subsegmental atelectasis and/or scarring at the left lung base. Trace left effusion. Mild central pulmonary vascular congestion as before. IMPRESSION: 1. Satisfactory endotracheal and gastric tube positions. 2.  Aortic atherosclerosis. 3. Mild central vascular congestion. 4. Left basilar subsegmental atelectasis and/or scarring. 5. Trace left effusion. Electronically Signed   By: Ashley Royalty M.D.   On: 10/07/2016 22:44   Dg Chest Portable 1 View  Result Date: 10/07/2016 CLINICAL DATA:  Dialysis.  Unresponsive . EXAM: PORTABLE CHEST 1 VIEW COMPARISON:  09/25/2016 . FINDINGS: Endotracheal tube noted with tip 3.7 cm above the carina. NG tube noted with tip below left hemidiaphragm. Mild cardiomegaly with mild pulmonary vascular prominence. Left base subsegmental atelectasis. Small left pleural effusion cannot be excluded. No pneumothorax . IMPRESSION: 1.  Endotracheal tube and NG tube in stable position. 2.  Cardiomegaly with mild pulmonary vascular prominence. 3. Mild left base subsegmental atelectasis. Small left pleural effusion cannot be excluded . Electronically  Signed   ByMarcello Moores  Register   On: 10/07/2016 16:53   Medications: Infusions: . phenylephrine (NEO-SYNEPHRINE) Adult infusion Stopped (10/08/16 1435)  . propofol (DIPRIVAN) infusion Stopped (10/08/16 1020)    Scheduled Medications: . aspirin  325 mg Oral Daily  . cefTRIAXone (ROCEPHIN)  IV  1 g Intravenous Q24H  . chlorhexidine  15 mL Mouth Rinse BID  . insulin aspart  2-6 Units Subcutaneous Q4H  . lactulose  30 g Per Tube TID  . mouth rinse  15 mL Mouth Rinse q12n4p  . midodrine  10 mg Oral Q M,W,F-HD  . pantoprazole sodium  40 mg Per Tube Q24H    have reviewed scheduled and prn medications.  Physical Exam: General: xtubated- slow mentation but dont know baseline Heart: RRR Lungs: mostly clear Abdomen: soft, non tender Extremities: no signif peripheral edema Dialysis Access: right AVF     10/09/2016,7:48 AM  LOS: 2 days

## 2016-10-09 NOTE — Progress Notes (Signed)
Consult note: indicated she wanted help with hCPOA however she said that was mistaken. Cllosing consult

## 2016-10-09 NOTE — Care Management Note (Signed)
Case Management Note  Patient Details  Name: Wanda [name] MRN: [id number] Date of Birth: 04/16/1976  Subjective/Objective:   From home presents with unresponsiveness/acute encephalopathy after HD session, has ESRD,  palliative consulted, per pt eval rec CIR, CSW consulted also.  NCM will cont to follow for dc needs.                Action/Plan:   Expected Discharge Date:                  Expected Discharge Plan:  IP Rehab Facility  In-House Referral:  Clinical Social Work  Discharge planning Services  CM Consult  Post Acute Care Choice:    Choice offered to:     DME Arranged:    DME Agency:     HH Arranged:    [place] Agency:     Status of Service:  In process, will continue to follow  If discussed at Long Length of Stay Meetings, dates discussed:    Additional Comments:  Zenon Mayo, RN 10/09/2016, 6:28 PM

## 2016-10-09 NOTE — Progress Notes (Signed)
This morning patient has refused many of her medications including lactulose. She has been administered pain medications and is drowsy, yet continues to request pain medication both P.O.  And IV. She also has requested that the MD change her IV antibiotics to P.O.  She is currently  On the bedpan for the 5th time this morning passing gas but no stool at this time. She refuses to have bedpan removed at this time. I advised her of pressure ulcer risk.

## 2016-10-09 NOTE — Evaluation (Signed)
Physical Therapy Evaluation Patient Details Name: Wanda [name] MRN: [id number] DOB: [birth year] Today's Date: 10/09/2016   History of Present Illness  41 year old female with PMH of ESRD on HD (T/R/F), GERD, Liver Cirrhosis with ascites and H/O varices, Depression, Chronic Hypotension, and Thrombocytopenia who was admitted on 2/28 after becoming unresponsive during HD session. Intubated for airway protection. Noted to have episode of clonic jerking of upper extremities.  Of note patient was admitted 2/13-2/18 after missing HD sessions for over a week with intubation and confusion as well as paracentesis during that stay.  This admission pt intubated 2/28 -3/1.  EEG showed diffuse cerebral dysfunction that is non-specific in etiology and can be seen with hypoxic/ischemic injury, toxic/metabolic encephalopathies, or medication effect.   Clinical Impression  Patient presents with decreased independence with mobility due to deficits listed in PT problem list.  Currently she needs mod to min A for all mobility with decreased activity tolerance, poor balance, decreased LE strength and ROM and, though she reports she has assist, feel safest plan for CIR level rehab at d/c (especially given recent admission, age, and needing dialysis.)  Will continue skilled PT in acute setting as well.     Follow Up Recommendations Supervision/Assistance - 24 hour;CIR    Equipment Recommendations  Rolling walker with 5" wheels (youth walker)    Recommendations for Other Services Rehab consult;OT consult     Precautions / Restrictions Precautions Precautions: Fall      Mobility  Bed Mobility Overal bed mobility: Needs Assistance Bed Mobility: Rolling;Sidelying to Sit;Sit to Supine Rolling: Mod assist Sidelying to sit: Mod assist   Sit to supine: Min assist   General bed mobility comments: pulled up on rail and on PT with assist to move feet off bed and lift trunk upright, to supine guided feet into  bed  Transfers Overall transfer level: Needs assistance Equipment used: Rolling walker (2 wheeled) Transfers: Sit to/from Stand Sit to Stand: Min assist         General transfer comment: assist for safety up from bed with multiple lines and bed too high for her height  Ambulation/Gait Ambulation/Gait assistance: Min assist Ambulation Distance (Feet): 25 Feet Assistive device: Rolling walker (2 wheeled) Gait Pattern/deviations: Step-through pattern;Decreased stride length;Decreased dorsiflexion - right;Decreased dorsiflexion - left;Shuffle     General Gait Details: slow pace, assist for balance, walker management in tight room, noted toe touch first due to weak ankle DF and tight heel cords  Stairs            Wheelchair Mobility    Modified Rankin (Stroke Patients Only)       Balance Overall balance assessment: Needs assistance Sitting-balance support: Feet unsupported Sitting balance-Leahy Scale: Fair     Standing balance support: Bilateral upper extremity supported Standing balance-Leahy Scale: Poor Standing balance comment: unable to stand without UE support                             Pertinent Vitals/Pain Pain Assessment: Faces Faces Pain Scale: Hurts little more Pain Location: abdomen Pain Descriptors / Indicators: Aching Pain Intervention(s): Monitored during session;Patient requesting pain meds-RN notified    Home Living Family/patient expects to be discharged to:: Private residence Living Arrangements: Spouse/significant other Available Help at Discharge: Family;Available PRN/intermittently Type of Home: House Home Access: Level entry (another entrance with stairs and one rail)     Home Layout: One level Home Equipment: Walker - standard;Bedside commode  Prior Function Level of Independence: Independent with assistive device(s);Needs assistance   Gait / Transfers Assistance Needed: uses standard walker occasionally  ADL's /  Homemaking Assistance Needed: doesn't drive or do grocery shopping  Comments: uses walker at times. on good days can do everything for herself and at others requires assist for ADLs     Hand Dominance   Dominant Hand: Right    Extremity/Trunk Assessment   Upper Extremity Assessment Upper Extremity Assessment: Generalized weakness    Lower Extremity Assessment Lower Extremity Assessment: Generalized weakness;RLE deficits/detail;LLE deficits/detail RLE Deficits / Details: tight heel cords (AROM to approx -20 DF), weak ankle DF -3/5 LLE Deficits / Details: tight heel cords (AROM to approx -20 DF), weak ankle DF -3/5       Communication   Communication: No difficulties  Cognition Arousal/Alertness: Awake/alert Behavior During Therapy: Anxious Overall Cognitive Status: History of cognitive impairments - at baseline Area of Impairment: Problem solving             Problem Solving: Slow processing General Comments: guarded demeanor, answers questions eventually, but precedes each answer stating I have already answered these questions already    General Comments General comments (skin integrity, edema, etc.): on bedpan initially, assist to roll and for hygiene due to liquid brown stool; after ambulation noted audible bowel sounds so placed back on bedpan RN aware    Exercises     Assessment/Plan    PT Assessment Patient needs continued PT services  PT Problem List Decreased strength;Decreased range of motion;Decreased mobility;Decreased safety awareness;Decreased activity tolerance;Decreased balance;Decreased knowledge of use of DME       PT Treatment Interventions DME instruction;Gait training;Balance training;Functional mobility training;Therapeutic exercise;Patient/family education;Therapeutic activities    PT Goals (Current goals can be found in the Care Plan section)  Acute Rehab PT Goals Patient Stated Goal: to go home PT Goal Formulation: With patient Time For  Goal Achievement: 10/16/16 Potential to Achieve Goals: Fair    Frequency Min 3X/week   Barriers to discharge Inaccessible home environment patient not willing to name names, but states she has different friends who can come to help her as boyfriend works some    Co-evaluation               End of Session Equipment Utilized During Treatment: Gait belt Activity Tolerance: Patient limited by fatigue;Other (comment) (self limited) Patient left: in bed;with bed alarm set Nurse Communication: Mobility status PT Visit Diagnosis: Muscle weakness (generalized) (M62.81);Other abnormalities of gait and mobility (R26.89)         Time: [id number] PT Time Calculation (min) (ACUTE ONLY): 40 min   Charges:   PT Evaluation $PT Eval Moderate Complexity: 1 Procedure PT Treatments $Gait Training: 8-22 mins $Therapeutic Activity: 8-22 mins   PT G Codes:         Reginia Naas 10/16/16, 1:50 PM  Magda Kiel, [place] 2016/10/16

## 2016-10-09 NOTE — Progress Notes (Addendum)
Inpatient Rehabilitation  Per PT request patient was screened by Gunnar Fusi for appropriateness for an Inpatient Acute Rehab consult.  At this time we are recommending an OT eval and treat order as well as an Inpatient Rehab consult if goals of care are identified for intense therapies prior to returning home.  Call if questions.  Please order if appropriate.    Carmelia Roller., CCC/SLP Admission Coordinator  [facility]  Cell [phone]

## 2016-10-09 NOTE — Progress Notes (Signed)
PULMONARY / CRITICAL CARE MEDICINE   Name: Wanda [name] MRN: [id number] DOB: 28-Nov-1975    ADMISSION DATE:  10/07/2016 CONSULTATION DATE:  10/07/2016   REFERRING MD:  Dr. Laverta Baltimore  CHIEF COMPLAINT: Encephalopathic   HISTORY OF PRESENT ILLNESS:   41 year old female with PMH of ESRD on HD (T/R/F), GERD, Liver Cirrhosis with ascites and H/O varices, Depression, Chronic Hypotension, and Thrombocytopenia presents to [facility] ED on 2/28 after becoming unresponsive during HD session. No response to Narcan. Intubated for airway protection. Noted to have episode of clonic jerking of upper extremities.Transferred to [facility] for further management. PCCM asked to admit.   Of note patient was admitted 2/14-2/18 after missing HD sessions for over a week, then on 2/13 during HD she became confused and combative, then progressively unresponsive. During stay intubated 2/13 and then extubated 2/15 with a paracentesis of 3.6L out on 2/14.   SUBJECTIVE:  Awake and alert. Asking to take oxygen off.Supine in bed requesting pain medicine for abdominal pain. VITAL SIGNS: BP (!) 83/47   Pulse 95   Temp 98.3 F (36.8 C) (Oral)   Resp 17   Ht 5\' 1"  (1.549 m)   Wt 128 lb 3.2 oz (58.2 kg)   LMP 09/21/2013   SpO2 100%   BMI 24.22 kg/m   HEMODYNAMICS:    VENTILATOR SETTINGS: Vent Mode: PSV;CPAP FiO2 (%):  [40 %] 40 % PEEP:  [5 cmH20] 5 cmH20 Pressure Support:  [5 cmH20-8 cmH20] 8 cmH20  INTAKE / OUTPUT: I/O last 3 completed shifts: In: 2863.9 [I.V.:1443; NG/GT:310; IV Piggyback:1110.9] Out: 370 [Urine:20; Emesis/NG output:350]  PHYSICAL EXAMINATION:  General- No distress,  A&Ox3,,asking to take off oxygen ENT: No sinus tenderness, TM clear, pale nasal mucosa, no oral exudate,no post nasal drip, no LAN Cardiac: S1, S2, regular rate and rhythm, no murmur Chest: No wheeze/ rales/ dullness; no accessory muscle use, no nasal flaring, no sternal retractions Abd.: Soft , tender to palpation, quiet  BS Ext: No clubbing cyanosis, edema Neuro:  Deconditioned at baseline Skin: No rashes, warm and dry, multiple scabs and bruising noted. Psych: Flat affect, ? Drug seeking   LABS:  BMET  Recent Labs Lab 10/07/16 1705 10/07/16 2357 10/08/16 0310 10/09/16 0348  NA 137  --  135 136  K 3.6  --  4.8 3.7  CL 94*  --  94* 96*  CO2 25  --  24 21*  BUN 32*  --  39* 45*  CREATININE 6.58* 7.57* 7.56* 9.12*  GLUCOSE 138*  --  97 81    Electrolytes  Recent Labs Lab 10/07/16 1705 10/08/16 0310 10/09/16 0348  CALCIUM 9.3 9.0 9.0  MG  --  2.3 2.3  PHOS  --  7.9* 8.9*    CBC  Recent Labs Lab 10/07/16 2357 10/08/16 0310 10/09/16 0348  WBC 8.7 7.7 5.6  HGB 10.5* 10.4* 8.9*  HCT 33.6* 33.2* 28.2*  PLT 85* 95* 75*    Coag's  Recent Labs Lab 10/07/16 1705  INR 1.00    Sepsis Markers  Recent Labs Lab 10/07/16 1716 10/07/16 2357 10/08/16 0310 10/08/16 0321 10/09/16 0348  LATICACIDVEN 2.46* 1.9  --  1.8  --   PROCALCITON  --  3.53 4.43  --  3.51    ABG  Recent Labs Lab 10/07/16 2110 10/07/16 2327 10/08/16 0355  PHART 7.512* 7.534* 7.490*  PCO2ART 28.1* 32.1 29.8*  PO2ART 169.0* 185.0* 126*    Liver Enzymes  Recent Labs Lab 10/07/16 1705  AST 68*  ALT 31  ALKPHOS 131*  BILITOT 2.2*  ALBUMIN 3.8    Cardiac Enzymes  Recent Labs Lab 10/07/16 2357 10/08/16 0310 10/08/16 0948  TROPONINI 4.32* 6.10* 4.40*    Glucose  Recent Labs Lab 10/08/16 1159 10/08/16 1600 10/08/16 2005 10/08/16 2335 10/09/16 0328 10/09/16 0820  GLUCAP 94 93 90 86 88 90    Imaging Dg Chest Port 1 View  Result Date: 10/09/2016 CLINICAL DATA:  Acute respiratory failure EXAM: PORTABLE CHEST 1 VIEW COMPARISON:  10/08/2016 FINDINGS: Endotracheal tube and NG tube removed. Improved aeration of the lung bases. Mild residual left lower lobe atelectasis or infiltrate remains. No effusion IMPRESSION: Improved aeration in the lung bases following extubation. Mild  residual left lower lobe airspace disease. Electronically Signed   By: Franchot Gallo M.D.   On: 10/09/2016 07:18     STUDIES:  CXR 2/28 > Cardiomegaly with mild pulmonary vascular prominence, mild left base subsegmental atelectasis, small left pleural effusion can not be excluded  CT Head 2/28 > Vascular calcification in the carotid siphon regions. No intracranial mass, hemorrhage, or extra-axial fluid collections, gray-white compartments are normal  EEG 2/28 >>diffuse cerebral dysfunction that is non-specific in etiology and can be seen with hypoxic/ischemic injury, toxic/metabolic encephalopathies, or medication effect. The absence of epileptiform discharges does not rule out a clinical diagnosis of epilepsy.  Clinical correlation is advised. Echo 10/08/2016> EF 45=50%, akinesis of the mid-apicalanterolateral and apical myocardium.( Grade 1 diastolic dysfunction) [id number] Regurg LA: Mildly dilated TV: Mild regurg _ Wall motion abnormality is new compared to prior Echo  CULTURES: Blood 2/28 >> Urinalysis 2/28 >>  ANTIBIOTICS: Vancomycin 2/28 in ED Zosyn 2/28 in ED  Rocephin 10/08/2016 >>  SIGNIFICANT EVENTS: 2/28 > Presents to ED - during HD went unresponsive   LINES/TUBES: ETT 2/28 >>10/08/2016  DISCUSSION: 41 year old female with PMH of liver cirrhosis and ESRD on HD T/R/S presents to ED at [facility] on 2/28 after becoming unresponsive during HD session with jerking movements.Transferred to [facility] for further neuro evaluation.  ASSESSMENT / PLAN:  PULMONARY A: Inability to protect airway secondary to encephalopathy>> resolved Extubated 10/08/2016  P:    Titrate  oxygen for saturations > 92%  Aggressive pulmonary toilet OOB to chair when able   CARDIOVASCULAR A:  Chronic Hypotension  Demand ischemia -trop down trending but still 4.40 Echo indicates EF of 45-50% Akinesis of the mid-apical anterolateral and apical myocardium.( Grade 1 diastolic dysfunction)  P:  Cardiac  Monitoring  Echo results as above Abnormal wall motion, change from previous Echo Cardiology consult 3/2 Continue home Midodrine on HD Days  Neo weaned and off with MAP>65  RENAL A:   ESRD (T/T/S) P:   Consulted Nephrology for HD  Trend BMP Replace electrolytes as needed   GASTROINTESTINAL A:   Hepatic Cirrhosis with Ascites  H/O Varices  Continues to refuse Lactulose P:   Continue Home Lactulose - titrate to 2-3 soft BM/d Start Diet PPI Add carafate  HEMATOLOGIC A:   Chronic Thrombocytopenia ( 75,000) Chronic Anemia  P:  Trend CBC  SCDS will hold SQ Heparin   INFECTIOUS A:   Elevated Lactic Acid in setting of ESRD  ?post-seizure , resolved -T Max 100, no WBC  P:   Trend Fever and WBC curve  Rocephin started 3/1 Follow Blood Cultures for resulting  ENDOCRINE A:   Hyperglycemia  P:   CBG monitoring  SSI  NEUROLOGIC A:   Acute Metabolic vs Hepatic Encephalopathy Seizures vs  Tremors   P:   Minimize sedation EEG abnormal with diffuse cerebral dysfunction that is non-specific in etiology  RASS goal: 0/-1   FAMILY  - Updates: No family at bedside    - Inter-disciplinary family meet or Palliative Care meeting due by: 3/7  Pt. Is awake and alert. She is refusing her lactulose. We discussed the importance of taking her medications to prevent repeat hospitalizations. She verbalized that she know how important it was to take her medications, but continues to refuse them.She has a strange affect. Palliation has been consulted. We will also consult cardiology for abnormal echo with continued down trending but elevated troponins. Transfer to [facility], and move to tele bed.Palliation consulted 3/1 and plans to come and speak with patient.  Magdalen Spatz, AGACNP-BC [facility] Pager # [phone]   10/09/2016

## 2016-10-09 NOTE — Consult Note (Signed)
.      The patient has been seen in conjunction with Kerin Ransom, PA-C. All aspects of care have been considered and discussed. The patient has been personally interviewed, examined, and all clinical data has been reviewed.   Complicated but also sent clinical situation in this 41 year old female whom we are asked to evaluate because of elevated troponin and abnormal echocardiogram.  She has end-stage kidney disease, cirrhosis, recurrent GI bleeding, thrombocytopenia, bipolar disorder, and history of medical noncompliance.  The elevated troponin and reduction in LV function make it highly likely that obstructive coronary artery disease could be present.  Unfortunately, we have nothing to offer in this situation. Medical therapy would tend to aggravate hypotension and make dialysis more difficult. Invasive strategies with thought of PCI are not prudent given noncompliance, history of GI bleeding, and thrombocytopenia. She is not a surgical candidate.  I recommend continued medical therapy as tolerated. Aspirin if safe. Sublingual nitroglycerin if recurring chest pain.  Prognosis is very poor.  Please reconsult if we may be of further assistance.    Reason for Consult:   Elevated Troponin, abnormal echo  Requesting Physician: CCM Primary Cardiologist New  HPI:   Unfortunate 41 y/o female from [place] with a history of ESRD on HD (Dr Hinda Lenis), NASH and cirrhosis, GI bleeding with a history of esophageal varices, chronic hypotension, thrombocytopenia, past non compliance, and Bipolar disorder.  She presented to [facility] ED on 2/28 after becoming unresponsive during HD session. No response to Narcan. She was intubated for airway protection. She was noted to have episode of clonic jerking of upper extremities.The pt was transferred to [facility] for further management. Since admission Troponin has been positive-4.32-6.10-4.40. Her echo shows new LVD with an EF of 45-50% with WMA,  different than echo in 2015. She has no history of chest pain or prior cardiac eval.   Of note patient was admitted 2/14-2/18 after missing HD sessions for over a week, then on 2/13 during HD she became confused and combative, then progressively unresponsive. During stay intubated 2/13 and then extubated 2/15 with a paracentesis of 3.6L out on 2/14.      PMHx:  Past Medical History:  Diagnosis Date  . Acute blood loss anemia 02/25/2014   Status post transfusion  . Acute renal failure ([place]) 09/2013   Pre-renal- resolved  . Anasarca 10/10/2013  . Anxiety   . Bipolar disorder ([place]) 12/04/2013   2007-SEEN IN ED FOR INVOLUNTARY COMMITMENT, UDS POS FOR AMPHETAMINES/OPIATES   . Bleeding esophageal varices ([place]) 02/28/2014   s/p banding  . C. difficile colitis 04/19/2014  . Chronic hypotension   . Cirrhosis ([place]) 10/05/13   Liver bx 11/23/13 (delayed initially due to patient refusal). c/w steatohepatitis  . Cirrhosis of liver with ascites ([place])   . Depression   . ESRD (end stage renal disease) on dialysis ([place]) 08/2014  . Folate deficiency 09/2013  . Gastroesophageal junction ulcer 09/17/2014  . GERD (gastroesophageal reflux disease)   . Hematemesis/vomiting blood 02/24/2014  . Macrocytosis 02/28/2014  . PNA (pneumonia) 10/13/2013  . SBP (spontaneous bacterial peritonitis) ([place]) 11/10/2013  . Thrombocytopenia ([place])    Hypercellular bone marrow; abundant megakaryocytes per 08/27/2014; s/p bone marrow bx    Past Surgical History:  Procedure Laterality Date  . AV FISTULA PLACEMENT Right 11/16/2014   Procedure: Right arm Creation of arteriovenous fistula;  Surgeon: Angelia Mould, MD;  Location: [place];  Service: Vascular;  Laterality: Right;  . CENTRAL VENOUS CATHETER INSERTION Right   .  COLONOSCOPY N/A 12/19/2013   SLF:NO OBVIOUS SOURCE FOR ANEMIA IDETIFIED/ONE COLON POLYP REMOVED/Small internal hemorrhoids  . ESOPHAGEAL BANDING  07/04/2014   Procedure: ESOPHAGEAL BANDING;  Surgeon: Daneil Dolin, MD;  [facility];  Service: Endoscopy;;  . ESOPHAGEAL BANDING N/A 07/24/2014   Procedure: ESOPHAGEAL BANDING (2 bands applied);  Surgeon: Danie Binder, MD;  Location[facility];  Service: Endoscopy;  Laterality: N/A;  . ESOPHAGEAL BANDING N/A 07/23/2015   Procedure: ESOPHAGEAL BANDING;  Surgeon: Danie Binder, MD;  [facility];  Service: Endoscopy;  Laterality: N/A;  . ESOPHAGEAL BANDING N/A 03/04/2016   Procedure: ESOPHAGEAL BANDING;  Surgeon: Danie Binder, MD;  [facility];  Service: Endoscopy;  Laterality: N/A;  . ESOPHAGEAL BANDING N/A 04/30/2016   Procedure: ESOPHAGEAL BANDING;  Surgeon: Daneil Dolin, MD;  [facility];  Service: Endoscopy;  Laterality: N/A;  . ESOPHAGOGASTRODUODENOSCOPY N/A 11/14/2013   SLF:1 column of very small varices in distal esopahgus/MODERATE PORTAL GASTROPATHY IN PROXIMAL STOMACH/MODERATE erosive gastritis  . ESOPHAGOGASTRODUODENOSCOPY N/A 02/11/2014   Dr. Rourk:Esophageal varices with bleeding stigmata-status post esophageal band ligation therapy. Portal gastropathy  . ESOPHAGOGASTRODUODENOSCOPY N/A 07/04/2014   RMR: Persiting grade 2 esophageal varicies with bleeding stigmata status post band ligation. Significantly congested gastric mucosa iwith changes constistant with protal gastropathy.   . ESOPHAGOGASTRODUODENOSCOPY N/A 09/16/2014   Rehman: Single short column of varix proximal to GE junction not large enough to be banded. Two amall ulcers at the GEJ felt to be source of GI Bleeding but no active bleeding but no actibe bleeding noted. No therapy rendered. Portal gastropathy NO evidence of peptic ulcer diease or gastric varices.   . ESOPHAGOGASTRODUODENOSCOPY N/A 12/14/2014   SLF: Grade ! esophageal varices. 2. Moderate Portal Gastropathy  . ESOPHAGOGASTRODUODENOSCOPY N/A 03/27/2015   Procedure: ESOPHAGOGASTRODUODENOSCOPY (EGD);  Surgeon: Danie Binder, MD;  [facility];  Service: Endoscopy;   Laterality: N/A;  1045am - moved to 817 @ 11:30  . ESOPHAGOGASTRODUODENOSCOPY N/A 06/05/2015   Procedure: ESOPHAGOGASTRODUODENOSCOPY (EGD);  Surgeon: Clarene Essex, MD;  Location: [facility] ENDOSCOPY;  Service: Endoscopy;  Laterality: N/A;  . ESOPHAGOGASTRODUODENOSCOPY N/A 07/23/2015   SLF:1. Grade 1 esophageal varices 2. Moderate portal hypertensive gastropathy 3. MILd non-erosive gastritis.   [name] ESOPHAGOGASTRODUODENOSCOPY N/A 03/04/2016   Dr. Oneida Alar: grade 1 varices, surveillance in Jan 2017   . ESOPHAGOGASTRODUODENOSCOPY N/A 05/21/2016   Procedure: ESOPHAGOGASTRODUODENOSCOPY (EGD);  Surgeon: Gatha Mayer, MD;  Location: [facility] ENDOSCOPY;  Service: Endoscopy;  Laterality: N/A;  . ESOPHAGOGASTRODUODENOSCOPY (EGD) WITH PROPOFOL N/A 07/24/2014   SLF:  1. 2 columns grade 2-3 varices- 2 Bands applied.  2.  Moderate gastropathy 3. Duodenal Diverticula  . ESOPHAGOGASTRODUODENOSCOPY (EGD) WITH PROPOFOL N/A 10/26/2014   RMR: 2 columns of grade 2 esophageal varices without obvious bleeding stigmata status post band ligation to complet obliteration of remaining varices.   . ESOPHAGOGASTRODUODENOSCOPY (EGD) WITH PROPOFOL N/A 04/30/2016   Procedure: ESOPHAGOGASTRODUODENOSCOPY (EGD) WITH PROPOFOL;  Surgeon: Daneil Dolin, MD;  [facility];  Service: Endoscopy;  Laterality: N/A;  . None    . PARACENTESIS  Feb 2015   1180 fluid, negative fluid analysis.   [name] PARACENTESIS  10/2013    SOCHx:  reports that she quit smoking about 7 years ago. Her smoking use included Cigarettes. She has a 5.00 pack-year smoking history. She has never used smokeless tobacco. She reports that she does not drink alcohol or use drugs.  FAMHx: Family History  Problem Relation  Age of Onset  . Heart disease Mother   . Colon cancer Neg Hx   . Liver disease Neg Hx     ALLERGIES: Allergies  Allergen Reactions  . Lasix [Furosemide] Other (See Comments)    "doesn't work"  . Latex Itching    ROS: Review of Systems: General:  negative for chills, fever, night sweats or weight changes.  Cardiovascular: negative for chest pain, dyspnea on exertion, edema, orthopnea, palpitations, paroxysmal nocturnal dyspnea or shortness of breath HEENT: negative for any visual disturbances, blindness, glaucoma Dermatological: negative for rash Respiratory: negative for cough, hemoptysis, or wheezing Urologic: negative for hematuria or dysuria Abdominal: negative for nausea, vomiting, diarrhea, bright red blood per rectum, melena, or hematemesis Neurologic: negative for visual changes, syncope, or dizziness Musculoskeletal: negative for back pain, joint pain, or swelling Psych: cooperative and appropriate All other systems reviewed and are otherwise negative except as noted above.   HOME MEDICATIONS: Prior to Admission medications   Medication Sig Start Date End Date Taking? Authorizing Provider  clotrimazole (LOTRIMIN) 1 % cream Apply 1 application topically 3 (three) times daily. 09/30/16  Yes Danie Binder, MD  Darbepoetin Alfa (ARANESP) 60 MCG/0.3ML SOSY injection Inject 0.3 mLs (60 mcg total) into the vein every Thursday with hemodialysis. Patient taking differently: Inject 60 mcg into the vein every Tuesday with hemodialysis.  06/09/15  Yes Cherene Altes, MD  lactulose (CHRONULAC) 10 GM/15ML solution Take 45 mLs (30 g total) by mouth 3 (three) times daily. TAKE 15-30 ml BY MOUTH three times a day Patient taking differently: Take 10-20 g by mouth 3 (three) times daily. TAKE 15-30 ml BY MOUTH three times a day 09/27/16  Yes Cherene Altes, MD  lidocaine (XYLOCAINE) 2 % solution TAKE TWO TEASPOONSFUL (10ML) BY MOUTH BEFORE MEALS AND AT BEDTIME TO PREVENT CHEST PAIN WHILE EATING 03/19/16  Yes Carlis Stable, NP  lidocaine-prilocaine (EMLA) cream Apply 1 application topically as needed (for dialysis treatments).   Yes Historical Provider, MD  LORazepam (ATIVAN) 1 MG tablet Take 1 mg by mouth 2 (two) times daily as needed for  anxiety (and/or back spasms).   Yes Historical Provider, MD  midodrine (PROAMATINE) 10 MG tablet Take 1 tablet (10 mg total) by mouth every morning. Patient taking differently: Take 10 mg by mouth See admin instructions. Take ONLY on dialysis days 09/28/16  Yes Cherene Altes, MD  omeprazole (PRILOSEC) 20 MG capsule 1 po every morning 30 minutes prior to your first meal. Patient taking differently: Take 20 mg by mouth daily before breakfast. 1 po every morning 30 minutes prior to your first meal. 09/30/16  Yes Danie Binder, MD  Oxycodone HCl 10 MG TABS Take 0.5 tablets (5 mg total) by mouth 3 (three) times daily as needed. 1 PO TID AS NEEDED FOR PAIN Patient taking differently: Take 5-10 mg by mouth 3 (three) times daily as needed.  09/27/16  Yes Cherene Altes, MD  RENVELA 800 MG tablet Take 1600 mg by mouth 3 times daily with meals. Take 800 mg by mouth with snacks. 10/29/15  Yes Historical Provider, MD    HOSPITAL MEDICATIONS: I have reviewed the patient's current medications.  VITALS: Blood pressure (!) 83/47, pulse 95, temperature 98 F (36.7 C), temperature source Oral, resp. rate 17, height 5\' 1"  (1.549 m), weight 128 lb 3.2 oz (58.2 kg), last menstrual period 09/21/2013, SpO2 100 %.  PHYSICAL EXAM: General appearance: alert, cooperative, no distress and extubated Neck: no carotid bruit and no  JVD Lungs: decreased breath sounds Heart: regular rate and rhythm Abdomen: soft, non-tender; bowel sounds normal; no masses,  no organomegaly and ascites Extremities: no edema Pulses: diminnished Skin: Skin color, texture, turgor normal. No rashes or lesions Neurologic: Grossly normal  LABS: Results for orders placed or performed during the hospital encounter of 10/07/16 (from the past 24 hour(s))  Glucose, capillary     Status: None   Collection Time: 10/08/16  4:00 PM  Result Value Ref Range   Glucose-Capillary 93 65 - 99 mg/dL   Comment 1 Notify RN    Comment 2 Document in Chart    Glucose, capillary     Status: None   Collection Time: 10/08/16  8:05 PM  Result Value Ref Range   Glucose-Capillary 90 65 - 99 mg/dL   Comment 1 Notify RN    Comment 2 Document in Chart   Glucose, capillary     Status: None   Collection Time: 10/08/16 11:35 PM  Result Value Ref Range   Glucose-Capillary 86 65 - 99 mg/dL   Comment 1 Notify RN    Comment 2 Document in Chart   Glucose, capillary     Status: None   Collection Time: 10/09/16  3:28 AM  Result Value Ref Range   Glucose-Capillary 88 65 - 99 mg/dL   Comment 1 Notify RN    Comment 2 Document in Chart   Procalcitonin     Status: None   Collection Time: 10/09/16  3:48 AM  Result Value Ref Range   Procalcitonin 3.51 ng/mL  CBC     Status: Abnormal   Collection Time: 10/09/16  3:48 AM  Result Value Ref Range   WBC 5.6 4.0 - 10.5 K/uL   RBC 2.81 (L) 3.87 - 5.11 MIL/uL   Hemoglobin 8.9 (L) 12.0 - 15.0 g/dL   HCT 28.2 (L) 36.0 - 46.0 %   MCV 100.4 (H) 78.0 - 100.0 fL   MCH 31.7 26.0 - 34.0 pg   MCHC 31.6 30.0 - 36.0 g/dL   RDW 18.2 (H) 11.5 - 15.5 %   Platelets 75 (L) 150 - 400 K/uL  Basic metabolic panel     Status: Abnormal   Collection Time: 10/09/16  3:48 AM  Result Value Ref Range   Sodium 136 135 - 145 mmol/L   Potassium 3.7 3.5 - 5.1 mmol/L   Chloride 96 (L) 101 - 111 mmol/L   CO2 21 (L) 22 - 32 mmol/L   Glucose, Bld 81 65 - 99 mg/dL   BUN 45 (H) 6 - 20 mg/dL   Creatinine, Ser 9.12 (H) 0.44 - 1.00 mg/dL   Calcium 9.0 8.9 - 10.3 mg/dL   GFR calc non Af Amer 5 (L) >60 mL/min   GFR calc Af Amer 5 (L) >60 mL/min   Anion gap 19 (H) 5 - 15  Magnesium     Status: None   Collection Time: 10/09/16  3:48 AM  Result Value Ref Range   Magnesium 2.3 1.7 - 2.4 mg/dL  Phosphorus     Status: Abnormal   Collection Time: 10/09/16  3:48 AM  Result Value Ref Range   Phosphorus 8.9 (H) 2.5 - 4.6 mg/dL  Glucose, capillary     Status: None   Collection Time: 10/09/16  8:20 AM  Result Value Ref Range   Glucose-Capillary  90 65 - 99 mg/dL   Comment 1 Notify RN    Comment 2 Document in Chart   Glucose, capillary  Status: Abnormal   Collection Time: 10/09/16 12:25 PM  Result Value Ref Range   Glucose-Capillary 114 (H) 65 - 99 mg/dL   Comment 1 Notify RN    Comment 2 Document in Chart     EKG: NSR, LVH-personally reviewed  IMAGING: Ct Head Wo Contrast  Result Date: 10/07/2016 CLINICAL DATA:  Seizure activity.  End-stage renal disease EXAM: CT HEAD WITHOUT CONTRAST TECHNIQUE: Contiguous axial images were obtained from the base of the skull through the vertex without intravenous contrast. COMPARISON:  September 22, 2016 FINDINGS: Brain: The ventricles are normal in size and configuration. There is no intracranial mass, hemorrhage, extra-axial fluid collection, or midline shift gray-white compartments are normal. No acute infarct evident. Vascular: No hyperdense vessel. Calcification is noted in the carotid siphon regions bilaterally. Skull: The bony calvarium appears intact. Sinuses/Orbits: Visualized paranasal sinuses are clear. Visualized orbits appear symmetric bilaterally. Other: Visualized mastoid air cells are clear. IMPRESSION: Vascular calcification in the carotid siphon regions. No intracranial mass, hemorrhage, or extra-axial fluid collection. Gray-white compartments are normal. Electronically Signed   By: Lowella Grip III M.D.   On: 10/07/2016 17:20   Dg Chest Port 1 View  Result Date: 10/09/2016 CLINICAL DATA:  Acute respiratory failure EXAM: PORTABLE CHEST 1 VIEW COMPARISON:  10/08/2016 FINDINGS: Endotracheal tube and NG tube removed. Improved aeration of the lung bases. Mild residual left lower lobe atelectasis or infiltrate remains. No effusion IMPRESSION: Improved aeration in the lung bases following extubation. Mild residual left lower lobe airspace disease. Electronically Signed   By: Franchot Gallo M.D.   On: 10/09/2016 07:18   Dg Chest Port 1 View  Result Date: 10/08/2016 CLINICAL DATA:   Respiratory failure.  Shortness of breath . EXAM: PORTABLE CHEST 1 VIEW COMPARISON:  10/07/2016. FINDINGS: Endotracheal tube and NG tube in stable position. Heart size normal. Basilar subsegmental atelectasis. No pleural effusion or pneumothorax. IMPRESSION: 1. Lines and tubes in stable position. 2. Basilar subsegmental atelectasis. Tiny left pleural effusion. Chest is stable from prior exam. Electronically Signed   By: Marcello Moores  Register   On: 10/08/2016 07:01   Dg Chest Port 1 View  Result Date: 10/07/2016 CLINICAL DATA:  Respiratory failure. Check endotracheal tube position. EXAM: PORTABLE CHEST 1 VIEW COMPARISON:  10/07/2016 FINDINGS: The tip of an endotracheal tube is 5 cm above the carina. Gastric tube extends into the expected location of the stomach. The tip is excluded on this exam. The heart and mediastinal contours are stable with aortic atherosclerosis. Subsegmental atelectasis and/or scarring at the left lung base. Trace left effusion. Mild central pulmonary vascular congestion as before. IMPRESSION: 1. Satisfactory endotracheal and gastric tube positions. 2. Aortic atherosclerosis. 3. Mild central vascular congestion. 4. Left basilar subsegmental atelectasis and/or scarring. 5. Trace left effusion. Electronically Signed   By: Ashley Royalty M.D.   On: 10/07/2016 22:44   Dg Chest Portable 1 View  Result Date: 10/07/2016 CLINICAL DATA:  Dialysis.  Unresponsive . EXAM: PORTABLE CHEST 1 VIEW COMPARISON:  09/25/2016 . FINDINGS: Endotracheal tube noted with tip 3.7 cm above the carina. NG tube noted with tip below left hemidiaphragm. Mild cardiomegaly with mild pulmonary vascular prominence. Left base subsegmental atelectasis. Small left pleural effusion cannot be excluded. No pneumothorax . IMPRESSION: 1.  Endotracheal tube and NG tube in stable position. 2.  Cardiomegaly with mild pulmonary vascular prominence. 3. Mild left base subsegmental atelectasis. Small left pleural effusion cannot be excluded .  Electronically Signed   By: Marcello Moores  Register   On: 10/07/2016 16:53  IMPRESSION:  Elevated Troponin and new WMA on echo-possible NSTEMI  ESRD-HD  Cirhhosis- end stage liver disease, seen by transplant team in 2016  Hypotension-chronic  Bipolar- compliance may be an issue   RECOMMENDATION: Will review with MD. Beta blocker may be difficult with hypotension and end stage liver and renal disease. Not sure she is a candidate for cath, possibly a candidate for Myoview for risk stratification.   Time Spent Directly with Patient: [address] minutes  Kerin Ransom, [place] beeper 10/09/2016, 2:24 PM

## 2016-10-10 DIAGNOSIS — J9601 Acute respiratory failure with hypoxia: Secondary | ICD-10-CM

## 2016-10-10 LAB — BASIC METABOLIC PANEL
ANION GAP: 22 — AB (ref 5–15)
BUN: 65 mg/dL — ABNORMAL HIGH (ref 6–20)
CHLORIDE: 94 mmol/L — AB (ref 101–111)
CO2: 17 mmol/L — AB (ref 22–32)
Calcium: 8.6 mg/dL — ABNORMAL LOW (ref 8.9–10.3)
Creatinine, Ser: 10.85 mg/dL — ABNORMAL HIGH (ref 0.44–1.00)
GFR calc Af Amer: 4 mL/min — ABNORMAL LOW (ref >60)
GFR, EST NON AFRICAN AMERICAN: 4 mL/min — AB (ref >60)
GLUCOSE: 77 mg/dL (ref 65–99)
POTASSIUM: 4.1 mmol/L (ref 3.5–5.1)
Sodium: 133 mmol/L — ABNORMAL LOW (ref 135–145)

## 2016-10-10 LAB — CBC
HEMATOCRIT: 28.8 % — AB (ref 36.0–46.0)
HEMOGLOBIN: 9.2 g/dL — AB (ref 12.0–15.0)
MCH: 31.6 pg (ref 26.0–34.0)
MCHC: 31.9 g/dL (ref 30.0–36.0)
MCV: 99 fL (ref 78.0–100.0)
Platelets: 91 10*3/uL — ABNORMAL LOW (ref 150–400)
RBC: 2.91 MIL/uL — ABNORMAL LOW (ref 3.87–5.11)
RDW: 18.1 % — ABNORMAL HIGH (ref 11.5–15.5)
WBC: 5.1 10*3/uL (ref 4.0–10.5)

## 2016-10-10 LAB — GLUCOSE, CAPILLARY
GLUCOSE-CAPILLARY: 164 mg/dL — AB (ref 65–99)
GLUCOSE-CAPILLARY: 105 mg/dL — AB (ref 65–99)

## 2016-10-10 LAB — LACTIC ACID, PLASMA: Lactic Acid, Venous: 1.3 mmol/L (ref 0.5–1.9)

## 2016-10-10 LAB — TROPONIN I: TROPONIN I: 0.7 ng/mL — AB (ref <0.03)

## 2016-10-10 MED ORDER — OXYCODONE HCL 5 MG PO TABS
ORAL_TABLET | ORAL | Status: AC
  Filled 2016-10-10: qty 1

## 2016-10-10 NOTE — Progress Notes (Signed)
Signed AMA paper, pt is leaving w/significant other now. Texted same to Dr. Wendee Beavers.

## 2016-10-10 NOTE — Progress Notes (Addendum)
RN notified of patients troponin level down to 0.70. RN will continue to monitor patient.

## 2016-10-10 NOTE — Clinical Social Work Note (Signed)
MD inquiring if we needed to IVC patient due to her crying and wanting to go home. Discussed with Surveyor, quantity of social work who said to speak with her and make sure she was not a risk to herself or others. CSW met with patient. Boyfriend at bedside. Discussed risks of leaving AMA. CSW also explained that her insurance will not pay for this stay if she leaves AMA. Patient now agreeable to remaining in the hospital but wants to know why it is so difficult for her to get pain medication. CSW to discuss with RN.  MD notified.   Wanda [name], [place]

## 2016-10-10 NOTE — Procedures (Signed)
Patient was seen on dialysis and the procedure was supervised.  BFR 350  Via AVF BP is  106/70.   Patient appears to be tolerating treatment well  Wanda [name] A 10/10/2016

## 2016-10-10 NOTE — Progress Notes (Signed)
Pt asking to go home, crying. Told her she needed to stay in hosp longer, her spouse or sig. Other is at bedside.  Pt is upset about not going home.  Dr Wendee Beavers called.

## 2016-10-10 NOTE — Progress Notes (Signed)
Subjective:  Seen on HD- confused- seems angry says wants to go home and do dialysis in her center- BP is better. Palliative care has made contact- identified that Ms Gandolfo may not be the best person to make medical decisions for herself  Objective Vital signs in last 24 hours: Vitals:   10/10/16 0412 10/10/16 0707 10/10/16 0728 10/10/16 0733  BP: (!) 95/46 (!) 113/54 (!) 119/54 (!) 113/53  Pulse: 88 86 80 79  Resp: 18 17 13 16   Temp: 98.8 F (37.1 C) 98.1 F (36.7 C)    TempSrc: Oral Oral    SpO2: 99% 99%    Weight:  59.2 kg (130 lb 8.2 oz)    Height:       Weight change:   Intake/Output Summary (Last 24 hours) at 10/10/16 0807 Last data filed at 10/09/16 1700  Gross per 24 hour  Intake              360 ml  Output                0 ml  Net              360 ml    Dialysis Orders:  TThurs,Fri at [facility] ?  Sometimes Saturday per pt 3:15 hours, 3K/2.5Ca bath, EDW 56.5kg, Revaclr 1190 dialyzer, BFR 350, DFR 600 - Heparin 200U bolus + 500U/hr pump - Epogen 8000 IV q HD - Hectoral 1.84mcg IV q HD  Assessment/Plan: 1.  Encephalopathy: Work-up pending. per primary. BCx pending. Empiric Vanc/Zosyn/Ceftriaxone- now weaned to rocephin only  started. Now extubated and improved ?Seizure: Per primary. 2.  ESRD: Last dialyzed Wed 2/28 (resched after missing Tues), will now plan for HD today to get her back on schedule 3.  Hypotension/volume: Chronically low, on midodrine. Try for EDW with next HD- low BP limits- does not seem too overloaded at this time- may need longer dialysis time to achieve 4.  Anemia: Hgb 10.4--8.9 this AM. Last ferritin high- liver dz- have added  ESA  5.  Metabolic bone disease: Phos high. Started renvela 6.  Cirrhosis: Ammonia 57 yesterday. on lactulose. Per primary. 7.  DM: On insulin, per primary. 8. Dispo- seems to be having a little FTT- planning on palliative care consult- hosp 2/13 to 2/18 after ER visit 2/10 and 2/13.  For now- mobilize and continue  with discussions   Faithe Ariola A    Labs: Basic Metabolic Panel:  Recent Labs Lab 10/08/16 0310 10/09/16 0348 10/10/16 0307  NA 135 136 133*  K 4.8 3.7 4.1  CL 94* 96* 94*  CO2 24 21* 17*  GLUCOSE 97 81 77  BUN 39* 45* 65*  CREATININE 7.56* 9.12* 10.85*  CALCIUM 9.0 9.0 8.6*  PHOS 7.9* 8.9*  --    Liver Function Tests:  Recent Labs Lab 10/07/16 1705  AST 68*  ALT 31  ALKPHOS 131*  BILITOT 2.2*  PROT 8.5*  ALBUMIN 3.8    Recent Labs Lab 10/07/16 1705  LIPASE 119*    Recent Labs Lab 10/07/16 1706 10/07/16 2357  AMMONIA 11 57*   CBC:  Recent Labs Lab 10/07/16 1705 10/07/16 2357 10/08/16 0310 10/09/16 0348 10/10/16 0307  WBC 10.1 8.7 7.7 5.6 5.1  NEUTROABS 8.8*  --   --   --   --   HGB 11.1* 10.5* 10.4* 8.9* 9.2*  HCT 34.6* 33.6* 33.2* 28.2* 28.8*  MCV 99.7 100.0 99.4 100.4* 99.0  PLT 82* 85* 95* 75* 91*   Cardiac  Enzymes:  Recent Labs Lab 10/07/16 2357 10/08/16 0310 10/08/16 0948 10/10/16 0418  TROPONINI 4.32* 6.10* 4.40* 0.70*   CBG:  Recent Labs Lab 10/09/16 0820 10/09/16 1225 10/09/16 2114 10/10/16 0102 10/10/16 0410  GLUCAP 90 114* 129* 164* 105*    Iron Studies: No results for input(s): IRON, TIBC, TRANSFERRIN, FERRITIN in the last 72 hours. Studies/Results: Dg Chest Port 1 View  Result Date: 10/09/2016 CLINICAL DATA:  Acute respiratory failure EXAM: PORTABLE CHEST 1 VIEW COMPARISON:  10/08/2016 FINDINGS: Endotracheal tube and NG tube removed. Improved aeration of the lung bases. Mild residual left lower lobe atelectasis or infiltrate remains. No effusion IMPRESSION: Improved aeration in the lung bases following extubation. Mild residual left lower lobe airspace disease. Electronically Signed   By: Franchot Gallo M.D.   On: 10/09/2016 07:18   Medications: Infusions:   Scheduled Medications: . aspirin  325 mg Oral Daily  . cefTRIAXone (ROCEPHIN)  IV  1 g Intravenous Q24H  . darbepoetin (ARANESP) injection -  DIALYSIS  200 mcg Intravenous Q Sat-HD  . insulin aspart  2-6 Units Subcutaneous Q4H  . lactulose  30 g Per Tube TID  . midodrine  10 mg Oral Daily  . pantoprazole sodium  40 mg Per Tube Q24H  . sevelamer carbonate  1,600 mg Oral TID WC  . sucralfate  1 g Oral TID WC & HS    have reviewed scheduled and prn medications.  Physical Exam: General: xtubated- slow mentation but dont know baseline Heart: RRR Lungs: mostly clear Abdomen: soft, non tender Extremities: no signif peripheral edema Dialysis Access: right AVF     10/10/2016,8:07 AM  LOS: 3 days

## 2016-10-10 NOTE — Progress Notes (Signed)
I called and spoke w/Ashley, LCSW. She will come up to see pt shortly-pt crying, wants to go home now.

## 2016-10-10 NOTE — Progress Notes (Addendum)
PROGRESS NOTE    Wanda [name]  [id number] DOB: 04/24/1976 DOA: 10/07/2016 PCP: [name], MD   Brief Narrative:  41 yo female brought to [facility] from HD with altered mental status.  She was intubated for airway protection.  She was noted to have jerking movements of her arms.  She was transferred to [facility] for further assessment.  She had similar event earlier this month.  She has hx of NASH with cirrhosis with hepatic encephalopathy.   Patient currently on IV antibiotics and continue to be confused. Requesting to go home. Given ongoing confusion and pending blood culture results with possible infection patient should continue to remain in the hospital for observation   Assessment & Plan:    Acute encephalopathy - Most likely secondary to hepatic encephalopathy. Continue current medication regimen - Follow-up with cultures - Antibiotic narrowed  Active Problems:   Bipolar disorder (HCC)    Thrombocytopenia ([place])   ESRD on dialysis [facility]) - Nephrology on board and managing  Hypotension - Continue medical drain    Liver cirrhosis secondary to NASH ([place]) - Causing hepatic encephalopathy   Shock ([place])   Seizure ([place]) - No seizure like activity reported    Palliative care by specialist - Currently on board   Cardiomyopathy- ? ischemic /Troponin level elevated - Cardiology recommending medical management. Patient is not a surgical candidate and has a poor prognosis    Acute respiratory failure ([place]) - Resolved   DVT prophylaxis: SCD's Code Status: Full Family Communication: none at bedside Disposition Plan: pending further hospital course   Consultants:   Nephrology  cardiology   Procedures: HD   Antimicrobials: Rocephin   Subjective: Pt has no new complaints. Still confused and does not understand the importance of her continued monitoring despite detailed explanation. Asking the same question over and over again and tangential  Objective: Vitals:    10/10/16 1000 10/10/16 1030 10/10/16 1048 10/10/16 1429  BP: (!) 105/56 (!) 104/56 (!) 109/57 (!) 97/49  Pulse: (!) 109 (!) 115 (!) 119 95  Resp: 20 16 12 20   Temp:   98.4 F (36.9 C) 98.2 F (36.8 C)  TempSrc:   Oral Oral  SpO2:   95% 99%  Weight:   56.5 kg (124 lb 9 oz)   Height:        Intake/Output Summary (Last 24 hours) at 10/10/16 1549 Last data filed at 10/10/16 1048  Gross per 24 hour  Intake              360 ml  Output             2700 ml  Net            -2340 ml   Filed Weights   10/09/16 0421 10/10/16 0707 10/10/16 1048  Weight: 58.2 kg (128 lb 3.2 oz) 59.2 kg (130 lb 8.2 oz) 56.5 kg (124 lb 9 oz)    Examination:  General exam: Appears calm and comfortable, in nad Respiratory system: Clear to auscultation. Respiratory effort normal. Cardiovascular system: S1 & S2 heard, RRR. No JVD, murmurs, rubs Gastrointestinal system: No guarding, soft and nontender.  Central nervous system: confused. No facial asymmetry, moves Extremities equally Extremities: Symmetric 5 x 5 power. Skin: No rashes, lesions or ulcers, limited exam Psychiatry: Mood appropriate flat affect    Data Reviewed: I have personally reviewed following labs and imaging studies  CBC:  Recent Labs Lab 10/07/16 1705 10/07/16 2357 10/08/16 0310 10/09/16 0348 10/10/16 0307  WBC 10.1  8.7 7.7 5.6 5.1  NEUTROABS 8.8*  --   --   --   --   HGB 11.1* 10.5* 10.4* 8.9* 9.2*  HCT 34.6* 33.6* 33.2* 28.2* 28.8*  MCV 99.7 100.0 99.4 100.4* 99.0  PLT 82* 85* 95* 75* 91*   Basic Metabolic Panel:  Recent Labs Lab 10/07/16 1705 10/07/16 2357 10/08/16 0310 10/09/16 0348 10/10/16 0307  NA 137  --  135 136 133*  K 3.6  --  4.8 3.7 4.1  CL 94*  --  94* 96* 94*  CO2 25  --  24 21* 17*  GLUCOSE 138*  --  97 81 77  BUN 32*  --  39* 45* 65*  CREATININE 6.58* 7.57* 7.56* 9.12* 10.85*  CALCIUM 9.3  --  9.0 9.0 8.6*  MG  --   --  2.3 2.3  --   PHOS  --   --  7.9* 8.9*  --    GFR: Estimated  Creatinine Clearance: 5.1 mL/min (by C-G formula based on SCr of 10.85 mg/dL (H)). Liver Function Tests:  Recent Labs Lab 10/07/16 1705  AST 68*  ALT 31  ALKPHOS 131*  BILITOT 2.2*  PROT 8.5*  ALBUMIN 3.8    Recent Labs Lab 10/07/16 1705  LIPASE 119*    Recent Labs Lab 10/07/16 1706 10/07/16 2357  AMMONIA 11 57*   Coagulation Profile:  Recent Labs Lab 10/07/16 1705  INR 1.00   Cardiac Enzymes:  Recent Labs Lab 10/07/16 2357 10/08/16 0310 10/08/16 0948 10/10/16 0418  TROPONINI 4.32* 6.10* 4.40* 0.70*   BNP (last 3 results) No results for input(s): PROBNP in the last 8760 hours. HbA1C: No results for input(s): HGBA1C in the last 72 hours. CBG:  Recent Labs Lab 10/09/16 0820 10/09/16 1225 10/09/16 2114 10/10/16 0102 10/10/16 0410  GLUCAP 90 114* 129* 164* 105*   Lipid Profile:  Recent Labs  10/07/16 2357  TRIG 147   Thyroid Function Tests: No results for input(s): TSH, T4TOTAL, FREET4, T3FREE, THYROIDAB in the last 72 hours. Anemia Panel: No results for input(s): VITAMINB12, FOLATE, FERRITIN, TIBC, IRON, RETICCTPCT in the last 72 hours. Sepsis Labs:  Recent Labs Lab 10/07/16 1716 10/07/16 2357 10/08/16 0310 10/08/16 0321 10/09/16 0348 10/10/16 0307  PROCALCITON  --  3.53 4.43  --  3.51  --   LATICACIDVEN 2.46* 1.9  --  1.8  --  1.3    Recent Results (from the past 240 hour(s))  Culture, blood (routine x 2)     Status: None (Preliminary result)   Collection Time: 10/07/16  5:07 PM  Result Value Ref Range Status   Specimen Description BLOOD LEFT HAND  Final   Special Requests BOTTLES DRAWN AEROBIC AND ANAEROBIC [place]  Final   Culture NO GROWTH 3 DAYS  Final   Report Status PENDING  Incomplete  Culture, blood (routine x 2)     Status: None (Preliminary result)   Collection Time: 10/07/16  6:00 PM  Result Value Ref Range Status   Specimen Description BLOOD LEFT HAND  Final   Special Requests BOTTLES DRAWN AEROBIC AND ANAEROBIC  6CC EACH  Final   Culture NO GROWTH 3 DAYS  Final   Report Status PENDING  Incomplete  MRSA PCR Screening     Status: None   Collection Time: 10/07/16 10:36 PM  Result Value Ref Range Status   MRSA by PCR NEGATIVE NEGATIVE Final    Comment:        The GeneXpert MRSA Assay (FDA  approved for NASAL specimens only), is one component of a comprehensive MRSA colonization surveillance program. It is not intended to diagnose MRSA infection nor to guide or monitor treatment for MRSA infections.      Radiology Studies: Dg Chest Port 1 View  Result Date: 10/09/2016 CLINICAL DATA:  Acute respiratory failure EXAM: PORTABLE CHEST 1 VIEW COMPARISON:  10/08/2016 FINDINGS: Endotracheal tube and NG tube removed. Improved aeration of the lung bases. Mild residual left lower lobe atelectasis or infiltrate remains. No effusion IMPRESSION: Improved aeration in the lung bases following extubation. Mild residual left lower lobe airspace disease. Electronically Signed   By: Franchot Gallo M.D.   On: 10/09/2016 07:18   Scheduled Meds: . aspirin  325 mg Oral Daily  . cefTRIAXone (ROCEPHIN)  IV  1 g Intravenous Q24H  . darbepoetin (ARANESP) injection - DIALYSIS  200 mcg Intravenous Q Sat-HD  . insulin aspart  2-6 Units Subcutaneous Q4H  . lactulose  30 g Per Tube TID  . midodrine  10 mg Oral Daily  . oxyCODONE      . pantoprazole sodium  40 mg Per Tube Q24H  . sevelamer carbonate  1,600 mg Oral TID WC  . sucralfate  1 g Oral TID WC & HS   Continuous Infusions:   LOS: 3 days    Time spent: > 35 minutes   Velvet Bathe, MD [facility] Pager [phone] If 7PM-7AM, please contact night-coverage [email or web address] Password [name] 10/10/2016, 3:49 PM   Addendum: Patient wants to leave AMA. Mentation has improved and patient is aware of her medical problems and expresses appropriate discussion with social worker per my discussion with the Education officer, museum. As such patient will not be able to be IVC.  Given improvement in mentation and the fact the patient understands her medical condition if she'll wants to leave AMA that we will honor her wishes.

## 2016-10-10 NOTE — Progress Notes (Signed)
CSW was called into the room by patient. Patient stated that she reconsider and would like to leave AMA. Patient understands the risk of leaving AMA but would still like to continue with it. MD was notified of patient decision. Patient is not combative or hallucinating. Patients boyfriend will be picking patient up and taking her home. AMA paper work has been given to nurse to give to patient.   Rhea Pink, MSW,  [place]

## 2016-10-12 LAB — CULTURE, BLOOD (ROUTINE X 2)
CULTURE: NO GROWTH
Culture: NO GROWTH

## 2016-10-13 NOTE — Discharge Summary (Signed)
Please refer to progress note of day of d/c. But patient left AMA despite discussions to convince patient to stay.   Wanda [name], [employer]

## 2016-10-29 ENCOUNTER — Observation Stay (HOSPITAL_COMMUNITY)
Admission: EM | Admit: 2016-10-29 | Discharge: 2016-10-30 | Disposition: A | Discharge status: Home / Self Care | Payer: Medicare Other | Attending: Internal Medicine | Admitting: Internal Medicine

## 2016-10-29 ENCOUNTER — Encounter (HOSPITAL_COMMUNITY): Payer: Self-pay | Admitting: Emergency Medicine

## 2016-10-29 DIAGNOSIS — F319 Bipolar disorder, unspecified: Secondary | ICD-10-CM | POA: Diagnosis present

## 2016-10-29 DIAGNOSIS — Z992 Dependence on renal dialysis: Secondary | ICD-10-CM | POA: Diagnosis not present

## 2016-10-29 DIAGNOSIS — Z91040 Latex allergy status: Secondary | ICD-10-CM | POA: Insufficient documentation

## 2016-10-29 DIAGNOSIS — D696 Thrombocytopenia, unspecified: Secondary | ICD-10-CM | POA: Diagnosis present

## 2016-10-29 DIAGNOSIS — G894 Chronic pain syndrome: Secondary | ICD-10-CM | POA: Diagnosis present

## 2016-10-29 DIAGNOSIS — N186 End stage renal disease: Secondary | ICD-10-CM | POA: Diagnosis not present

## 2016-10-29 DIAGNOSIS — K7581 Nonalcoholic steatohepatitis (NASH): Secondary | ICD-10-CM

## 2016-10-29 DIAGNOSIS — K7031 Alcoholic cirrhosis of liver with ascites: Principal | ICD-10-CM | POA: Insufficient documentation

## 2016-10-29 DIAGNOSIS — R188 Other ascites: Secondary | ICD-10-CM

## 2016-10-29 DIAGNOSIS — Z79899 Other long term (current) drug therapy: Secondary | ICD-10-CM | POA: Insufficient documentation

## 2016-10-29 DIAGNOSIS — Z87891 Personal history of nicotine dependence: Secondary | ICD-10-CM | POA: Insufficient documentation

## 2016-10-29 DIAGNOSIS — E871 Hypo-osmolality and hyponatremia: Secondary | ICD-10-CM | POA: Diagnosis present

## 2016-10-29 DIAGNOSIS — K7460 Unspecified cirrhosis of liver: Secondary | ICD-10-CM | POA: Diagnosis present

## 2016-10-29 DIAGNOSIS — R1084 Generalized abdominal pain: Secondary | ICD-10-CM | POA: Diagnosis present

## 2016-10-29 DIAGNOSIS — D638 Anemia in other chronic diseases classified elsewhere: Secondary | ICD-10-CM | POA: Diagnosis present

## 2016-10-29 DIAGNOSIS — R05 Cough: Secondary | ICD-10-CM | POA: Diagnosis not present

## 2016-10-29 DIAGNOSIS — R109 Unspecified abdominal pain: Secondary | ICD-10-CM | POA: Diagnosis present

## 2016-10-29 DIAGNOSIS — N185 Chronic kidney disease, stage 5: Secondary | ICD-10-CM

## 2016-10-29 LAB — CBC
HCT: 26.4 % — ABNORMAL LOW (ref 36.0–46.0)
Hemoglobin: 8.9 g/dL — ABNORMAL LOW (ref 12.0–15.0)
MCH: 33.2 pg (ref 26.0–34.0)
MCHC: 33.7 g/dL (ref 30.0–36.0)
MCV: 98.5 fL (ref 78.0–100.0)
PLATELETS: 88 10*3/uL — AB (ref 150–400)
RBC: 2.68 MIL/uL — ABNORMAL LOW (ref 3.87–5.11)
RDW: 17.7 % — AB (ref 11.5–15.5)
WBC: 6.1 10*3/uL (ref 4.0–10.5)

## 2016-10-29 LAB — COMPREHENSIVE METABOLIC PANEL
ALK PHOS: 109 U/L (ref 38–126)
ALT: 27 U/L (ref 14–54)
AST: 54 U/L — AB (ref 15–41)
Albumin: 3.2 g/dL — ABNORMAL LOW (ref 3.5–5.0)
Anion gap: 18 — ABNORMAL HIGH (ref 5–15)
BILIRUBIN TOTAL: 1 mg/dL (ref 0.3–1.2)
BUN: 99 mg/dL — AB (ref 6–20)
CALCIUM: 8.5 mg/dL — AB (ref 8.9–10.3)
CO2: 19 mmol/L — ABNORMAL LOW (ref 22–32)
Chloride: 96 mmol/L — ABNORMAL LOW (ref 101–111)
Creatinine, Ser: 13.9 mg/dL — ABNORMAL HIGH (ref 0.44–1.00)
GFR calc Af Amer: 3 mL/min — ABNORMAL LOW (ref >60)
GFR, EST NON AFRICAN AMERICAN: 3 mL/min — AB (ref >60)
Glucose, Bld: 91 mg/dL (ref 65–99)
Potassium: 4.1 mmol/L (ref 3.5–5.1)
Sodium: 133 mmol/L — ABNORMAL LOW (ref 135–145)
TOTAL PROTEIN: 7.1 g/dL (ref 6.5–8.1)

## 2016-10-29 LAB — LIPASE, BLOOD: Lipase: 108 U/L — ABNORMAL HIGH (ref 11–51)

## 2016-10-29 MED ORDER — FENTANYL CITRATE (PF) 100 MCG/2ML IJ SOLN
50.0000 ug | Freq: Once | INTRAMUSCULAR | Status: AC
  Administered 2016-10-30: 50 ug via INTRAVENOUS
  Filled 2016-10-29: qty 2

## 2016-10-29 NOTE — ED Triage Notes (Signed)
Pt states that she has been having abd pain with nausea since this morning.  Denies any vomiting, diarrhea, constipation, or changes with urination

## 2016-10-29 NOTE — ED Provider Notes (Signed)
[facility] DEPT Provider Note   CSN: [id number] Arrival date & time: 10/29/16  1911 By signing my name below, I, Dyke Brackett, attest that this documentation has been prepared under the direction and in the presence of Ripley Fraise, MD . Electronically Signed: Dyke Brackett, Scribe. 10/29/2016. 11:57 PM.   History   Chief Complaint Chief Complaint  Patient presents with  . Abdominal Pain    HPI Wanda [name] is a 41 y.o. female with a history of ESRD on dialysis, GERD, and cirrhosis who presents to the Emergency Department complaining of constant, severe abdominal pain onset about 12 hours ago. Per pt, she has has similar pain before, but it has never been this severe. She notes associated intermittent abdominal distention, nausea, NBNB emesis, cough, and some burning chest pain. Pt also reports  recurrent bilateral leg swelling. No alleviating or modifying factors noted. She missed her dialysis appointment today due to her abdominal pain. Per pt, she is still able to make a small amount of urine. Pt denies any fever or diarrhea.    The history is provided by the patient. No language interpreter was used.    Past Medical History:  Diagnosis Date  . Acute blood loss anemia 02/25/2014   Status post transfusion  . Acute renal failure ([place]) 09/2013   Pre-renal- resolved  . Anasarca 10/10/2013  . Anxiety   . Bipolar disorder ([place]) 12/04/2013   2007-SEEN IN ED FOR INVOLUNTARY COMMITMENT, UDS POS FOR AMPHETAMINES/OPIATES   . Bleeding esophageal varices ([place]) 02/28/2014   s/p banding  . C. difficile colitis 04/19/2014  . Chronic hypotension   . Cirrhosis ([place]) 10/05/13   Liver bx 11/23/13 (delayed initially due to patient refusal). c/w steatohepatitis  . Cirrhosis of liver with ascites ([place])   . Depression   . ESRD (end stage renal disease) on dialysis ([place]) 08/2014  . Folate deficiency 09/2013  . Gastroesophageal junction ulcer 09/17/2014  . GERD (gastroesophageal reflux disease)     . Hematemesis/vomiting blood 02/24/2014  . Macrocytosis 02/28/2014  . PNA (pneumonia) 10/13/2013  . SBP (spontaneous bacterial peritonitis) ([place]) 11/10/2013  . Thrombocytopenia ([place])    Hypercellular bone marrow; abundant megakaryocytes per 08/27/2014; s/p bone marrow bx    Patient Active Problem List   Diagnosis Date Noted  . Cardiomyopathy- ? ischemic  10/09/2016  . Troponin level elevated 10/09/2016  . Goals of care, counseling/discussion   . Palliative care by specialist   . Acute respiratory failure ([place])   . Acute encephalopathy   . Seizure ([place]) 10/07/2016  . Pressure injury of skin 05/20/2016  . Shock ([place]) 05/19/2016  . Rhonchi   . Chronic pain syndrome 08/22/2015  . Abdominal wall cellulitis 08/20/2015  . Chronic abdominal pain 08/07/2015  . Umbilical hernia without obstruction and without gangrene 08/07/2015  . Liver cirrhosis secondary to NASH ([place])   . Acute blood loss anemia   . Hypotension 06/05/2015  . Edema of left lower extremity 02/22/2015  . History of esophageal varices with bleeding 09/16/2014  . ESRD on dialysis ([place])   . Macrocytosis 09/12/2014  . Thrombocytopenia ([place]) 08/19/2014  . Esophageal varices without bleeding ([place])   . Portal hypertensive gastropathy   . Malnutrition of moderate degree ([place]) 05/15/2014  . C. difficile colitis 04/19/2014  . Bipolar disorder ([place]) 12/04/2013  . SBP (spontaneous bacterial peritonitis) ([place]) 11/10/2013  . Liver cirrhosis secondary to nonalcoholic steatohepatitis (NASH) ([place]) 11/07/2013  . PNA (pneumonia) 10/13/2013  . Folate deficiency 10/13/2013  . Ascites due to alcoholic  cirrhosis ([place]) 09/11/2013    Past Surgical History:  Procedure Laterality Date  . AV FISTULA PLACEMENT Right 11/16/2014   Procedure: Right arm Creation of arteriovenous fistula;  Surgeon: Angelia Mould, MD;  Location: [facility];  Service: Vascular;  Laterality: Right;  . CENTRAL VENOUS CATHETER INSERTION Right   . COLONOSCOPY N/A  12/19/2013   SLF:NO OBVIOUS SOURCE FOR ANEMIA IDETIFIED/ONE COLON POLYP REMOVED/Small internal hemorrhoids  . ESOPHAGEAL BANDING  07/04/2014   Procedure: ESOPHAGEAL BANDING;  Surgeon: Daneil Dolin, MD;  [facility];  Service: Endoscopy;;  . ESOPHAGEAL BANDING N/A 07/24/2014   Procedure: ESOPHAGEAL BANDING (2 bands applied);  Surgeon: Danie Binder, MD;  Location: [facility];  Service: Endoscopy;  Laterality: N/A;  . ESOPHAGEAL BANDING N/A 07/23/2015   Procedure: ESOPHAGEAL BANDING;  Surgeon: Danie Binder, MD;  [facility];  Service: Endoscopy;  Laterality: N/A;  . ESOPHAGEAL BANDING N/A 03/04/2016   Procedure: ESOPHAGEAL BANDING;  Surgeon: Danie Binder, MD;  [facility];  Service: Endoscopy;  Laterality: N/A;  . ESOPHAGEAL BANDING N/A 04/30/2016   Procedure: ESOPHAGEAL BANDING;  Surgeon: Daneil Dolin, MD;  [facility];  Service: Endoscopy;  Laterality: N/A;  . ESOPHAGOGASTRODUODENOSCOPY N/A 11/14/2013   SLF:1 column of very small varices in distal esopahgus/MODERATE PORTAL GASTROPATHY IN PROXIMAL STOMACH/MODERATE erosive gastritis  . ESOPHAGOGASTRODUODENOSCOPY N/A 02/11/2014   Dr. Rourk:Esophageal varices with bleeding stigmata-status post esophageal band ligation therapy. Portal gastropathy  . ESOPHAGOGASTRODUODENOSCOPY N/A 07/04/2014   RMR: Persiting grade 2 esophageal varicies with bleeding stigmata status post band ligation. Significantly congested gastric mucosa iwith changes constistant with protal gastropathy.   . ESOPHAGOGASTRODUODENOSCOPY N/A 09/16/2014   Rehman: Single short column of varix proximal to GE junction not large enough to be banded. Two amall ulcers at the GEJ felt to be source of GI Bleeding but no active bleeding but no actibe bleeding noted. No therapy rendered. Portal gastropathy NO evidence of peptic ulcer diease or gastric varices.   . ESOPHAGOGASTRODUODENOSCOPY N/A 12/14/2014   SLF: Grade ! esophageal varices. 2. Moderate  Portal Gastropathy  . ESOPHAGOGASTRODUODENOSCOPY N/A 03/27/2015   Procedure: ESOPHAGOGASTRODUODENOSCOPY (EGD);  Surgeon: Danie Binder, MD;  [facility];  Service: Endoscopy;  Laterality: N/A;  1045am - moved to 817 @ 11:30  . ESOPHAGOGASTRODUODENOSCOPY N/A 06/05/2015   Procedure: ESOPHAGOGASTRODUODENOSCOPY (EGD);  Surgeon: Clarene Essex, MD;  Location: [facility] ENDOSCOPY;  Service: Endoscopy;  Laterality: N/A;  . ESOPHAGOGASTRODUODENOSCOPY N/A 07/23/2015   SLF:1. Grade 1 esophageal varices 2. Moderate portal hypertensive gastropathy 3. MILd non-erosive gastritis.   [name] ESOPHAGOGASTRODUODENOSCOPY N/A 03/04/2016   Dr. Oneida Alar: grade 1 varices, surveillance in Jan 2017   . ESOPHAGOGASTRODUODENOSCOPY N/A 05/21/2016   Procedure: ESOPHAGOGASTRODUODENOSCOPY (EGD);  Surgeon: Gatha Mayer, MD;  Location: [facility] ENDOSCOPY;  Service: Endoscopy;  Laterality: N/A;  . ESOPHAGOGASTRODUODENOSCOPY (EGD) WITH PROPOFOL N/A 07/24/2014   SLF:  1. 2 columns grade 2-3 varices- 2 Bands applied.  2.  Moderate gastropathy 3. Duodenal Diverticula  . ESOPHAGOGASTRODUODENOSCOPY (EGD) WITH PROPOFOL N/A 10/26/2014   RMR: 2 columns of grade 2 esophageal varices without obvious bleeding stigmata status post band ligation to complet obliteration of remaining varices.   . ESOPHAGOGASTRODUODENOSCOPY (EGD) WITH PROPOFOL N/A 04/30/2016   Procedure: ESOPHAGOGASTRODUODENOSCOPY (EGD) WITH PROPOFOL;  Surgeon: Daneil Dolin, MD;  [facility];  Service: Endoscopy;  Laterality: N/A;  . None    . PARACENTESIS  Feb 2015   1180 fluid, negative fluid analysis.   [name]  PARACENTESIS  10/2013    OB History    Gravida Para Term Preterm AB Living   1 1 1     1    SAB TAB Ectopic Multiple Live Births                   Home Medications    Prior to Admission medications   Medication Sig Start Date End Date Taking? Authorizing Provider  clotrimazole (LOTRIMIN) 1 % cream Apply 1 application topically 3 (three) times daily. 09/30/16    Danie Binder, MD  Darbepoetin Alfa (ARANESP) 60 MCG/0.3ML SOSY injection Inject 0.3 mLs (60 mcg total) into the vein every Thursday with hemodialysis. Patient taking differently: Inject 60 mcg into the vein every Tuesday with hemodialysis.  06/09/15   Cherene Altes, MD  lactulose (CHRONULAC) 10 GM/15ML solution Take 45 mLs (30 g total) by mouth 3 (three) times daily. TAKE 15-30 ml BY MOUTH three times a day Patient taking differently: Take 10-20 g by mouth 3 (three) times daily. TAKE 15-30 ml BY MOUTH three times a day 09/27/16   Cherene Altes, MD  lidocaine (XYLOCAINE) 2 % solution TAKE TWO TEASPOONSFUL (10ML) BY MOUTH BEFORE MEALS AND AT BEDTIME TO PREVENT CHEST PAIN WHILE EATING 03/19/16   Carlis Stable, NP  lidocaine-prilocaine (EMLA) cream Apply 1 application topically as needed (for dialysis treatments).    Historical Provider, MD  LORazepam (ATIVAN) 1 MG tablet Take 1 mg by mouth 2 (two) times daily as needed for anxiety (and/or back spasms).    Historical Provider, MD  midodrine (PROAMATINE) 10 MG tablet Take 1 tablet (10 mg total) by mouth every morning. Patient taking differently: Take 10 mg by mouth See admin instructions. Take ONLY on dialysis days 09/28/16   Cherene Altes, MD  omeprazole (PRILOSEC) 20 MG capsule 1 po every morning 30 minutes prior to your first meal. Patient taking differently: Take 20 mg by mouth daily before breakfast. 1 po every morning 30 minutes prior to your first meal. 09/30/16   Danie Binder, MD  Oxycodone HCl 10 MG TABS Take 0.5 tablets (5 mg total) by mouth 3 (three) times daily as needed. 1 PO TID AS NEEDED FOR PAIN Patient taking differently: Take 5-10 mg by mouth 3 (three) times daily as needed.  09/27/16   Cherene Altes, MD  RENVELA 800 MG tablet Take 1600 mg by mouth 3 times daily with meals. Take 800 mg by mouth with snacks. 10/29/15   Historical Provider, MD    Family History Family History  Problem Relation Age of Onset  . Heart disease  Mother   . Colon cancer Neg Hx   . Liver disease Neg Hx     Social History Social History  Substance Use Topics  . Smoking status: Former Smoker    Packs/day: 0.25    Years: 20.00    Types: Cigarettes    Quit date: 11/05/2008  . Smokeless tobacco: Never Used     Comment: Never really smoked much  . Alcohol use No     Allergies   Lasix [furosemide] and Latex   Review of Systems Review of Systems  Constitutional: Negative for fever.  Respiratory: Positive for cough.   Cardiovascular: Positive for chest pain and leg swelling.  Gastrointestinal: Positive for abdominal distention, abdominal pain, nausea and vomiting. Negative for diarrhea.  All other systems reviewed and are negative.  Physical Exam Updated Vital Signs BP 129/68 (BP Location: Left Arm)   Pulse 87  Temp 98 F (36.7 C) (Oral)   Resp 18   Ht 5\' 1"  (1.549 m)   Wt 170 lb (77.1 kg)   LMP 09/21/2013   SpO2 100%   BMI 32.12 kg/m   Physical Exam CONSTITUTIONAL: Chronically ill appearing HEAD: Normocephalic/atraumatic EYES: EOMI/PERRL ENMT: Mucous membranes moist NECK: supple no meningeal signs SPINE/BACK:entire spine nontender CV: S1/S2 noted, no murmurs/rubs/gallops noted LUNGS: Lungs are clear to auscultation bilaterally, no apparent distress ABDOMEN: soft, diffuse abdominal tenderness,  easily reducible hernia (nontender) GU:no cva tenderness NEURO: Pt is awake/alert/appropriate, moves all extremitiesx4.  No facial droop.   EXTREMITIES: pulses normal/equal, full ROM, dialysis access to LUE SKIN: warm, color normal PSYCH: no abnormalities of mood noted, alert and oriented to situation   ED Treatments / Results  DIAGNOSTIC STUDIES:  Oxygen Saturation is 100% on RA, normal by my interpretation.    COORDINATION OF CARE:  12:01 AM Discussed treatment plan which includes abdominal US with pt at bedside and pt agreed to plan.   Labs (all labs ordered are listed, but only abnormal results are  displayed) Labs Reviewed  LIPASE, BLOOD - Abnormal; Notable for the following:       Result Value   Lipase 108 (*)    All other components within normal limits  COMPREHENSIVE METABOLIC PANEL - Abnormal; Notable for the following:    Sodium 133 (*)    Chloride 96 (*)    CO2 19 (*)    BUN 99 (*)    Creatinine, Ser 13.90 (*)    Calcium 8.5 (*)    Albumin 3.2 (*)    AST 54 (*)    GFR calc non Af Amer 3 (*)    GFR calc Af Amer 3 (*)    Anion gap 18 (*)    All other components within normal limits  CBC - Abnormal; Notable for the following:    RBC 2.68 (*)    Hemoglobin 8.9 (*)    HCT 26.4 (*)    RDW 17.7 (*)    Platelets 88 (*)    All other components within normal limits  CULTURE, BLOOD (ROUTINE X 2)  CULTURE, BLOOD (ROUTINE X 2)  AMMONIA  URINALYSIS, ROUTINE W REFLEX MICROSCOPIC  COMPREHENSIVE METABOLIC PANEL  PROTIME-INR  I-STAT BETA HCG BLOOD, ED ([facility], [facility], [facility] ONLY)    EKG  EKG Interpretation None       Radiology Ct Abdomen Pelvis W Contrast  Result Date: 10/30/2016 CLINICAL DATA:  Diffuse abdominal pain. EXAM: CT ABDOMEN AND PELVIS WITH CONTRAST TECHNIQUE: Multidetector CT imaging of the abdomen and pelvis was performed using the standard protocol following bolus administration of intravenous contrast. CONTRAST:  65mL ISOVUE-300 IOPAMIDOL (ISOVUE-300) INJECTION 61%, 80mL ISOVUE-300 IOPAMIDOL (ISOVUE-300) INJECTION 61% COMPARISON:  CT abdomen pelvis 09/19/2016 FINDINGS: Lower chest: No pulmonary nodules. No visible pleural or pericardial effusion. Hepatobiliary: There is hypertrophy of the left hepatic lobe and caudate with a diffusely nodular hepatic contour. There is a large amount of perihepatic ascites that tracks into the right paracolic gutter and pelvis. There is cholelithiasis. No focal liver lesions. The portal vein is patent. Pancreas: Normal pancreatic contours and enhancement. No peripancreatic fluid collection or pancreatic ductal dilatation. Spleen: The spleen  is mildly enlarged. There is a large amount of perisplenic ascites tracking down the left pericolic gutter into the pelvis. Adrenals/Urinary Tract: Normal adrenal glands. Both kidneys are atrophic. There is a dilated vein connecting the right renal vein and superior mesenteric vein. Stomach/Bowel: No abnormal bowel dilatation. No  bowel wall thickening or adjacent fat stranding to indicate acute inflammation. No abdominal fluid collection. Vascular/Lymphatic: There is atherosclerotic calcification of the non aneurysmal abdominal aorta. The portal vein, superior mesenteric vein and splenic vein are patent. No abdominal or pelvic adenopathy. Reproductive: Normal uterus and ovaries. Musculoskeletal: No lytic or blastic osseous lesion. Normal visualized extrathoracic and extraperitoneal soft tissues. Other: There is an umbilical hernia that is fluid filled. IMPRESSION: 1. Hepatic cirrhosis and findings of portal hypertension, including large volume ascites throughout the abdomen, extending along both paracolic gutters into the pelvis. 2. Cholelithiasis. 3. Aortic atherosclerosis. Electronically Signed   By: Ulyses Jarred M.D.   On: 10/30/2016 02:05    Procedures Procedures (including critical care time)  Medications Ordered in ED    Initial Impression / Assessment and Plan / ED Course  I have reviewed the triage vital signs and the nursing notes.  Pertinent labs & imaging results that were available during my care of the patient were reviewed by me and considered in my medical decision making (see chart for details).     3:54 AM Pt with complicated history h/o ESRD, on dialysis (recent missed session) and recurrent ascites due to cirrhosis She told me this was worse than previous, and she was difficult historian I attempted to perform bedside paracentesis but I was unable to visualize clear path with ultrasound to obtain fluid and I did not feel comfortable inserting needle for this procedure Due to  concern for possible SBP vs acute abdominal emergency, zosyn was given after my initial evaluation   D/W DR OPYD FOR ADMISSION PT AWAKE/ALERT/APPROPRIATE IN THE ED SHE WILL NEED DIALYSIS TODAY   Final Clinical Impressions(s) / ED Diagnoses   Final diagnoses:  Chronic renal failure, stage 5 (HCC)  Generalized abdominal pain  Ascites of liver    New Prescriptions New Prescriptions   No medications on file  I personally performed the services described in this documentation, which was scribed in my presence. The recorded information has been reviewed and is accurate.        Ripley Fraise, MD 10/30/16 [phone]

## 2016-10-30 ENCOUNTER — Observation Stay (HOSPITAL_COMMUNITY): Payer: Medicare Other

## 2016-10-30 ENCOUNTER — Encounter (HOSPITAL_COMMUNITY): Payer: Self-pay | Admitting: Family Medicine

## 2016-10-30 ENCOUNTER — Emergency Department (HOSPITAL_COMMUNITY): Payer: Medicare Other

## 2016-10-30 DIAGNOSIS — R188 Other ascites: Secondary | ICD-10-CM

## 2016-10-30 DIAGNOSIS — R109 Unspecified abdominal pain: Secondary | ICD-10-CM | POA: Diagnosis not present

## 2016-10-30 DIAGNOSIS — E871 Hypo-osmolality and hyponatremia: Secondary | ICD-10-CM

## 2016-10-30 DIAGNOSIS — D638 Anemia in other chronic diseases classified elsewhere: Secondary | ICD-10-CM | POA: Diagnosis not present

## 2016-10-30 DIAGNOSIS — N186 End stage renal disease: Secondary | ICD-10-CM | POA: Diagnosis not present

## 2016-10-30 DIAGNOSIS — R1084 Generalized abdominal pain: Secondary | ICD-10-CM

## 2016-10-30 DIAGNOSIS — D696 Thrombocytopenia, unspecified: Secondary | ICD-10-CM | POA: Diagnosis not present

## 2016-10-30 DIAGNOSIS — Z992 Dependence on renal dialysis: Secondary | ICD-10-CM | POA: Diagnosis not present

## 2016-10-30 DIAGNOSIS — K7581 Nonalcoholic steatohepatitis (NASH): Secondary | ICD-10-CM | POA: Diagnosis not present

## 2016-10-30 DIAGNOSIS — R1030 Lower abdominal pain, unspecified: Secondary | ICD-10-CM | POA: Diagnosis not present

## 2016-10-30 DIAGNOSIS — K7460 Unspecified cirrhosis of liver: Secondary | ICD-10-CM | POA: Diagnosis not present

## 2016-10-30 LAB — COMPREHENSIVE METABOLIC PANEL
ALBUMIN: 3 g/dL — AB (ref 3.5–5.0)
ALK PHOS: 99 U/L (ref 38–126)
ALT: 26 U/L (ref 14–54)
ANION GAP: 18 — AB (ref 5–15)
AST: 51 U/L — ABNORMAL HIGH (ref 15–41)
BUN: 103 mg/dL — ABNORMAL HIGH (ref 6–20)
CALCIUM: 8.5 mg/dL — AB (ref 8.9–10.3)
CHLORIDE: 94 mmol/L — AB (ref 101–111)
CO2: 21 mmol/L — AB (ref 22–32)
CREATININE: 14.05 mg/dL — AB (ref 0.44–1.00)
GFR calc Af Amer: 3 mL/min — ABNORMAL LOW (ref >60)
GFR calc non Af Amer: 3 mL/min — ABNORMAL LOW (ref >60)
GLUCOSE: 103 mg/dL — AB (ref 65–99)
Potassium: 4.3 mmol/L (ref 3.5–5.1)
SODIUM: 133 mmol/L — AB (ref 135–145)
Total Bilirubin: 0.9 mg/dL (ref 0.3–1.2)
Total Protein: 6.6 g/dL (ref 6.5–8.1)

## 2016-10-30 LAB — LACTATE DEHYDROGENASE, PLEURAL OR PERITONEAL FLUID: LD FL: 64 U/L — AB (ref 3–23)

## 2016-10-30 LAB — GRAM STAIN

## 2016-10-30 LAB — PROTIME-INR
INR: 1.11
Prothrombin Time: 14.3 seconds (ref 11.4–15.2)

## 2016-10-30 LAB — BODY FLUID CELL COUNT WITH DIFFERENTIAL
Eos, Fluid: 0 %
Lymphs, Fluid: 75 %
Monocyte-Macrophage-Serous Fluid: 19 % — ABNORMAL LOW (ref 50–90)
Neutrophil Count, Fluid: 6 % (ref 0–25)
Other Cells, Fluid: 0 %
WBC FLUID: 162 uL (ref 0–1000)

## 2016-10-30 LAB — I-STAT BETA HCG BLOOD, ED (MC, WL, AP ONLY): I-stat hCG, quantitative: <5 m[IU]/mL (ref <5)

## 2016-10-30 LAB — GLUCOSE, PLEURAL OR PERITONEAL FLUID: Glucose, Fluid: 98 mg/dL

## 2016-10-30 LAB — AMMONIA: AMMONIA: 32 umol/L (ref 9–35)

## 2016-10-30 LAB — PROTEIN, PLEURAL OR PERITONEAL FLUID: Total protein, fluid: 2.9 g/dL

## 2016-10-30 MED ORDER — OXYCODONE HCL 5 MG PO TABS: 5.0000 mg | ORAL_TABLET | Freq: Three times a day (TID) | ORAL | Status: DC

## 2016-10-30 MED ORDER — HYDROMORPHONE HCL 1 MG/ML IJ SOLN
0.5000 mg | INTRAMUSCULAR | Status: DC | PRN
  Filled 2016-10-30: qty 1

## 2016-10-30 MED ORDER — DEXTROSE 5 % IV SOLN
2.0000 g | INTRAVENOUS | Status: DC
  Administered 2016-10-30: 2 g via INTRAVENOUS
  Filled 2016-10-30 (×4): qty 2

## 2016-10-30 MED ORDER — LORAZEPAM 1 MG PO TABS: 1.0000 mg | ORAL_TABLET | Freq: Two times a day (BID) | ORAL | Status: DC | PRN

## 2016-10-30 MED ORDER — MIDODRINE HCL 5 MG PO TABS: 10.0000 mg | ORAL_TABLET | ORAL | Status: DC

## 2016-10-30 MED ORDER — IOPAMIDOL (ISOVUE-300) INJECTION 61%
100.0000 mL | Freq: Once | INTRAVENOUS | Status: AC | PRN
  Administered 2016-10-30: 75 mL via INTRAVENOUS

## 2016-10-30 MED ORDER — POLYETHYLENE GLYCOL 3350 17 G PO PACK: 17.0000 g | PACK | Freq: Every day | ORAL | Status: DC | PRN

## 2016-10-30 MED ORDER — IOPAMIDOL (ISOVUE-300) INJECTION 61%
INTRAVENOUS | Status: AC
Start: 2016-10-30 — End: 2016-10-30
  Administered 2016-10-30: 75 mL
  Filled 2016-10-30: qty 30

## 2016-10-30 MED ORDER — HEPARIN SODIUM (PORCINE) 5000 UNIT/ML IJ SOLN
5000.0000 [IU] | Freq: Three times a day (TID) | INTRAMUSCULAR | Status: DC
  Administered 2016-10-30: 5000 [IU] via SUBCUTANEOUS
  Filled 2016-10-30: qty 1

## 2016-10-30 MED ORDER — LACTULOSE 10 GM/15ML PO SOLN
30.0000 g | Freq: Three times a day (TID) | ORAL | Status: DC
  Administered 2016-10-30: 30 g via ORAL
  Filled 2016-10-30 (×2): qty 60

## 2016-10-30 MED ORDER — PIPERACILLIN-TAZOBACTAM IN DEX 2-0.25 GM/50ML IV SOLN
2.2500 g | Freq: Once | INTRAVENOUS | Status: AC
  Administered 2016-10-30: 2.25 g via INTRAVENOUS
  Filled 2016-10-30: qty 50

## 2016-10-30 MED ORDER — SODIUM CHLORIDE 0.9 % IV SOLN: 250.0000 mL | INTRAVENOUS | Status: DC | PRN

## 2016-10-30 MED ORDER — LIDOCAINE HCL (PF) 1 % IJ SOLN
5.0000 mL | INTRAMUSCULAR | Status: DC | PRN
  Administered 2016-10-30: 0.2 mL via INTRADERMAL

## 2016-10-30 MED ORDER — SODIUM CHLORIDE 0.9% FLUSH: 3.0000 mL | INTRAVENOUS | Status: DC | PRN

## 2016-10-30 MED ORDER — SODIUM CHLORIDE 0.9 % IV SOLN: 100.0000 mL | INTRAVENOUS | Status: DC | PRN

## 2016-10-30 MED ORDER — PIPERACILLIN-TAZOBACTAM IN DEX 2-0.25 GM/50ML IV SOLN
INTRAVENOUS | Status: AC
  Filled 2016-10-30: qty 50

## 2016-10-30 MED ORDER — PENTAFLUOROPROP-TETRAFLUOROETH EX AERO: 1.0000 "application " | INHALATION_SPRAY | CUTANEOUS | Status: DC | PRN

## 2016-10-30 MED ORDER — OXYCODONE HCL 5 MG PO TABS
5.0000 mg | ORAL_TABLET | Freq: Three times a day (TID) | ORAL | Status: DC | PRN
  Administered 2016-10-30: 5 mg via ORAL
  Filled 2016-10-30: qty 1

## 2016-10-30 MED ORDER — HYDROMORPHONE HCL 1 MG/ML IJ SOLN
1.0000 mg | INTRAMUSCULAR | Status: AC | PRN
  Administered 2016-10-30: 1 mg via INTRAVENOUS

## 2016-10-30 MED ORDER — ONDANSETRON HCL 4 MG/2ML IJ SOLN: 4.0000 mg | Freq: Four times a day (QID) | INTRAMUSCULAR | Status: AC | PRN

## 2016-10-30 MED ORDER — LIDOCAINE-PRILOCAINE 2.5-2.5 % EX CREA: 1.0000 "application " | TOPICAL_CREAM | CUTANEOUS | Status: DC | PRN

## 2016-10-30 MED ORDER — ONDANSETRON HCL 4 MG/2ML IJ SOLN: 4.0000 mg | Freq: Four times a day (QID) | INTRAMUSCULAR | Status: DC | PRN

## 2016-10-30 MED ORDER — HYDROMORPHONE HCL 1 MG/ML IJ SOLN
INTRAMUSCULAR | Status: AC
  Filled 2016-10-30: qty 1

## 2016-10-30 MED ORDER — EPOETIN ALFA 10000 UNIT/ML IJ SOLN
8000.0000 [IU] | INTRAMUSCULAR | Status: DC
  Filled 2016-10-30: qty 1

## 2016-10-30 MED ORDER — SODIUM CHLORIDE 0.9% FLUSH
3.0000 mL | Freq: Two times a day (BID) | INTRAVENOUS | Status: DC
  Administered 2016-10-30: 3 mL via INTRAVENOUS

## 2016-10-30 MED ORDER — FENTANYL CITRATE (PF) 100 MCG/2ML IJ SOLN
50.0000 ug | Freq: Once | INTRAMUSCULAR | Status: AC
  Administered 2016-10-30: 50 ug via INTRAVENOUS
  Filled 2016-10-30: qty 2

## 2016-10-30 MED ORDER — ALBUMIN HUMAN 25 % IV SOLN
50.0000 g | Freq: Once | INTRAVENOUS | Status: DC | PRN
  Filled 2016-10-30 (×2): qty 200

## 2016-10-30 MED ORDER — PANTOPRAZOLE SODIUM 40 MG PO TBEC
40.0000 mg | DELAYED_RELEASE_TABLET | Freq: Every day | ORAL | Status: DC
  Administered 2016-10-30: 40 mg via ORAL
  Filled 2016-10-30: qty 1

## 2016-10-30 MED ORDER — SEVELAMER CARBONATE 800 MG PO TABS
800.0000 mg | ORAL_TABLET | Freq: Three times a day (TID) | ORAL | Status: DC
  Filled 2016-10-30 (×2): qty 1

## 2016-10-30 MED ORDER — ONDANSETRON HCL 4 MG PO TABS: 4.0000 mg | ORAL_TABLET | Freq: Four times a day (QID) | ORAL | Status: DC | PRN

## 2016-10-30 MED ORDER — BISACODYL 5 MG PO TBEC: 5.0000 mg | DELAYED_RELEASE_TABLET | Freq: Every day | ORAL | Status: DC | PRN

## 2016-10-30 MED ORDER — LIDOCAINE HCL (PF) 1 % IJ SOLN
INTRAMUSCULAR | Status: AC
  Administered 2016-10-30: 0.2 mL via INTRADERMAL
  Filled 2016-10-30: qty 5

## 2016-10-30 MED ORDER — SODIUM CHLORIDE 0.9% FLUSH
3.0000 mL | Freq: Two times a day (BID) | INTRAVENOUS | Status: DC
Start: 2016-10-30 — End: 2016-10-30

## 2016-10-30 MED ORDER — EPOETIN ALFA 4000 UNIT/ML IJ SOLN
8000.0000 [IU] | INTRAMUSCULAR | Status: DC
  Filled 2016-10-30: qty 2

## 2016-10-30 NOTE — ED Notes (Signed)
Report given to [facility] RN on [phone]

## 2016-10-30 NOTE — Progress Notes (Signed)
Pt discharged in stable condition into the care of her family via wheelchair via private vehicle.  PIV removed intact and w/o S&S of complications.  Discharge instructions reviewed with pt.  Pt verbalized understanding.

## 2016-10-30 NOTE — Consult Note (Signed)
Referring Provider: [facility] Primary Care Physician:  [name], MD Primary Gastroenterologist:  Dr. Oneida Alar  Date of Admission: 10/29/16 Date of Consultation: 10/30/16  Reason for Consultation:  Abdominal pain, NSH Cirrhosis, ? SBP  HPI:  Wanda [name] is a 41 y.o. female with a past medical history of partially constipated no assurances, chronic normocytic anemia, end-stage acute renal failure, anxiety/bipolar disorder, bleeding grade 1 esophageal varices, C. difficile colitis, GERD, spontaneous bacterial peritonitis, thrombocytopenia. ED notes reviewed. Apparently she was in her normal state of health and began having worsening abdominal pain 3 days ago which continued to persist and worsen to the point of severe. Denies recent fevers and chills, nausea, vomiting, diarrhea. Her pain is sharp and generalized throughout the abdomen without any alleviating factors area and EGD provider considered bedside diagnostic paracentesis but was unable to find inadequate approach. She was given a dose of prophylactic Zosyn. Labs in the emergency room found stable hemoglobin 8.9 and thrombocytopenia 88,000. CT of the abdomen noted hepatic cirrhosis with portal hypertension and large volume ascites throughout the abdomen. She was hemodynamically stable. She was admitted for further management.  Today she underwent therapeutic paracentesis including labs. Paracentesis was successful in removing 2.7 L of dark yellow ascitic fluid. Cell count pending, smear review pending, cultures pending. Gram stain so far negative for organisms.  She was seen today in dialysis. Today she confirms the above information. Denies fever, chills, feeling "ill" leading up to hospitalization. Abdominal pain is significant (although she just received pain medicine prior to my visit). No improvement in abdominal pain after paracentesis. Admits intermittent nausea. Denies vomiting. No hematemesis, hematochezia, melena. No  other upper or lower GI symptoms.  Past Medical History:  Diagnosis Date  . Acute blood loss anemia 02/25/2014   Status post transfusion  . Acute renal failure ([place]) 09/2013   Pre-renal- resolved  . Anasarca 10/10/2013  . Anxiety   . Bipolar disorder ([place]) 12/04/2013   2007-SEEN IN ED FOR INVOLUNTARY COMMITMENT, UDS POS FOR AMPHETAMINES/OPIATES   . Bleeding esophageal varices ([place]) 02/28/2014   s/p banding  . C. difficile colitis 04/19/2014  . Chronic hypotension   . Cirrhosis ([place]) 10/05/13   Liver bx 11/23/13 (delayed initially due to patient refusal). c/w steatohepatitis  . Cirrhosis of liver with ascites ([place])   . Depression   . ESRD (end stage renal disease) on dialysis ([place]) 08/2014  . Folate deficiency 09/2013  . Gastroesophageal junction ulcer 09/17/2014  . GERD (gastroesophageal reflux disease)   . Hematemesis/vomiting blood 02/24/2014  . Macrocytosis 02/28/2014  . PNA (pneumonia) 10/13/2013  . SBP (spontaneous bacterial peritonitis) ([place]) 11/10/2013  . Thrombocytopenia ([place])    Hypercellular bone marrow; abundant megakaryocytes per 08/27/2014; s/p bone marrow bx    Past Surgical History:  Procedure Laterality Date  . AV FISTULA PLACEMENT Right 11/16/2014   Procedure: Right arm Creation of arteriovenous fistula;  Surgeon: Angelia Mould, MD;  Location: [facility];  Service: Vascular;  Laterality: Right;  . CENTRAL VENOUS CATHETER INSERTION Right   . COLONOSCOPY N/A 12/19/2013   SLF:NO OBVIOUS SOURCE FOR ANEMIA IDETIFIED/ONE COLON POLYP REMOVED/Small internal hemorrhoids  . ESOPHAGEAL BANDING  07/04/2014   Procedure: ESOPHAGEAL BANDING;  Surgeon: Daneil Dolin, MD;  [facility];  Service: Endoscopy;;  . ESOPHAGEAL BANDING N/A 07/24/2014   Procedure: ESOPHAGEAL BANDING (2 bands applied);  Surgeon: Danie Binder, MD;  [facility]: [facility];  Service: Endoscopy;  Laterality: N/A;  . ESOPHAGEAL BANDING N/A 07/23/2015   Procedure:  ESOPHAGEAL BANDING;  Surgeon: Danie Binder, MD;   [facility];  Service: Endoscopy;  Laterality: N/A;  . ESOPHAGEAL BANDING N/A 03/04/2016   Procedure: ESOPHAGEAL BANDING;  Surgeon: Danie Binder, MD;  [facility];  Service: Endoscopy;  Laterality: N/A;  . ESOPHAGEAL BANDING N/A 04/30/2016   Procedure: ESOPHAGEAL BANDING;  Surgeon: Daneil Dolin, MD;  [facility];  Service: Endoscopy;  Laterality: N/A;  . ESOPHAGOGASTRODUODENOSCOPY N/A 11/14/2013   SLF:1 column of very small varices in distal esopahgus/MODERATE PORTAL GASTROPATHY IN PROXIMAL STOMACH/MODERATE erosive gastritis  . ESOPHAGOGASTRODUODENOSCOPY N/A 02/11/2014   Dr. Rourk:Esophageal varices with bleeding stigmata-status post esophageal band ligation therapy. Portal gastropathy  . ESOPHAGOGASTRODUODENOSCOPY N/A 07/04/2014   RMR: Persiting grade 2 esophageal varicies with bleeding stigmata status post band ligation. Significantly congested gastric mucosa iwith changes constistant with protal gastropathy.   . ESOPHAGOGASTRODUODENOSCOPY N/A 09/16/2014   Rehman: Single short column of varix proximal to GE junction not large enough to be banded. Two amall ulcers at the GEJ felt to be source of GI Bleeding but no active bleeding but no actibe bleeding noted. No therapy rendered. Portal gastropathy NO evidence of peptic ulcer diease or gastric varices.   . ESOPHAGOGASTRODUODENOSCOPY N/A 12/14/2014   SLF: Grade ! esophageal varices. 2. Moderate Portal Gastropathy  . ESOPHAGOGASTRODUODENOSCOPY N/A 03/27/2015   Procedure: ESOPHAGOGASTRODUODENOSCOPY (EGD);  Surgeon: Danie Binder, MD;  [facility];  Service: Endoscopy;  Laterality: N/A;  1045am - moved to 817 @ 11:30  . ESOPHAGOGASTRODUODENOSCOPY N/A 06/05/2015   Procedure: ESOPHAGOGASTRODUODENOSCOPY (EGD);  Surgeon: Clarene Essex, MD;  Location[facility];  Service: Endoscopy;  Laterality: N/A;  . ESOPHAGOGASTRODUODENOSCOPY N/A 07/23/2015   SLF:1. Grade 1 esophageal varices 2. Moderate portal hypertensive  gastropathy 3. MILd non-erosive gastritis.   [name] ESOPHAGOGASTRODUODENOSCOPY N/A 03/04/2016   Dr. Oneida Alar: grade 1 varices, surveillance in Jan 2017   . ESOPHAGOGASTRODUODENOSCOPY N/A 05/21/2016   Procedure: ESOPHAGOGASTRODUODENOSCOPY (EGD);  Surgeon: Gatha Mayer, MD;  Location: [facility] END[facility];  Service: Endoscopy;  Laterality: N/A;  . ESOPHAGOGASTRODUODENOSCOPY (EGD) WITH PROPOFOL N/A 07/24/2014   SLF:  1. 2 columns grade 2-3 varices- 2 Bands applied.  2.  Moderate gastropathy 3. Duodenal Diverticula  . ESOPHAGOGASTRODUODENOSCOPY (EGD) WITH PROPOFOL N/A 10/26/2014   RMR: 2 columns of grade 2 esophageal varices without obvious bleeding stigmata status post band ligation to complet obliteration of remaining varices.   . ESOPHAGOGASTRODUODENOSCOPY (EGD) WITH PROPOFOL N/A 04/30/2016   Procedure: ESOPHAGOGASTRODUODENOSCOPY (EGD) WITH PROPOFOL;  Surgeon: Daneil Dolin, MD;  [facility];  Service: Endoscopy;  Laterality: N/A;  . None    . PARACENTESIS  Feb 2015   1180 fluid, negative fluid analysis.   [name] PARACENTESIS  10/2013    Prior to Admission medications   Medication Sig Start Date End Date Taking? Authorizing Provider  clotrimazole (LOTRIMIN) 1 % cream Apply 1 application topically 3 (three) times daily. 09/30/16  Yes Danie Binder, MD  Darbepoetin Alfa (ARANESP) 60 MCG/0.3ML SOSY injection Inject 0.3 mLs (60 mcg total) into the vein every Thursday with hemodialysis. Patient taking differently: Inject 60 mcg into the vein every Tuesday with hemodialysis.  06/09/15  Yes Cherene Altes, MD  lactulose (CHRONULAC) 10 GM/15ML solution Take 45 mLs (30 g total) by mouth 3 (three) times daily. TAKE 15-30 ml BY MOUTH three times a day Patient taking differently: Take 10-20 g by mouth 3 (three) times daily. TAKE 15-30 ml BY MOUTH three times a day 09/27/16  Yes Cherene Altes, MD  lidocaine (XYLOCAINE) 2 % solution TAKE TWO TEASPOONSFUL (10ML) BY MOUTH BEFORE MEALS AND AT BEDTIME TO PREVENT  CHEST PAIN WHILE EATING 03/19/16  Yes Carlis Stable, NP  lidocaine-prilocaine (EMLA) cream Apply 1 application topically as needed (for dialysis treatments).   Yes Historical Provider, MD  LORazepam (ATIVAN) 1 MG tablet Take 1 mg by mouth 2 (two) times daily as needed for anxiety (and/or back spasms).   Yes Historical Provider, MD  midodrine (PROAMATINE) 10 MG tablet Take 1 tablet (10 mg total) by mouth every morning. Patient taking differently: Take 10 mg by mouth See admin instructions. Take ONLY on dialysis days 09/28/16  Yes Cherene Altes, MD  omeprazole (PRILOSEC) 20 MG capsule 1 po every morning 30 minutes prior to your first meal. Patient taking differently: Take 20 mg by mouth daily before breakfast. 1 po every morning 30 minutes prior to your first meal. 09/30/16  Yes Danie Binder, MD  Oxycodone HCl 10 MG TABS Take 0.5 tablets (5 mg total) by mouth 3 (three) times daily as needed. 1 PO TID AS NEEDED FOR PAIN Patient taking differently: Take 5-10 mg by mouth 3 (three) times daily as needed.  09/27/16  Yes Cherene Altes, MD  RENVELA 800 MG tablet Take 1600 mg by mouth 3 times daily with meals. Take 800 mg by mouth with snacks. 10/29/15  Yes Historical Provider, MD    Current Facility-Administered Medications  Medication Dose Route Frequency Provider Last Rate Last Dose  . 0.9 %  sodium chloride infusion  250 mL Intravenous PRN Ilene Qua Opyd, MD      . 0.9 %  sodium chloride infusion  100 mL Intravenous PRN Manpreet Toya Smothers, MD      . 0.9 %  sodium chloride infusion  100 mL Intravenous PRN Manpreet Toya Smothers, MD      . bisacodyl (DULCOLAX) EC tablet 5 mg  5 mg Oral Daily PRN Vianne Bulls, MD      . cefTRIAXone (ROCEPHIN) 2 g in dextrose 5 % 50 mL IVPB  2 g Intravenous Q24H Vianne Bulls, MD 100 mL/hr at 10/30/16 0357 2 g at 10/30/16 0357  . [START ON 10/31/2016] epoetin alfa (EPOGEN,PROCRIT) injection 8,000 Units  8,000 Units Subcutaneous Q T,Th,Sa-HD Fran Lowes, MD      .  heparin injection 5,000 Units  5,000 Units Subcutaneous Q8H Timothy S Opyd, MD      . HYDROmorphone (DILAUDID) 1 MG/ML injection           . HYDROmorphone (DILAUDID) injection 1 mg  1 mg Intravenous Q4H PRN Vianne Bulls, MD   1 mg at 10/30/16 0402  . lactulose (CHRONULAC) 10 GM/15ML solution 30 g  30 g Oral TID Vianne Bulls, MD   30 g at 10/30/16 1113  . lidocaine (PF) (XYLOCAINE) 1 % injection 5 mL  5 mL Intradermal PRN Manpreet S Bhutani, MD      . lidocaine (PF) (XYLOCAINE) 1 % injection           . lidocaine-prilocaine (EMLA) cream 1 application  1 application Topical PRN Manpreet Toya Smothers, MD      . LORazepam (ATIVAN) tablet 1 mg  1 mg Oral BID PRN Vianne Bulls, MD      . Derrill Memo ON 11/02/2016] midodrine (PROAMATINE) tablet 10 mg  10 mg Oral Q M,W,F-HD Manpreet Toya Smothers, MD      . ondansetron (ZOFRAN) injection 4 mg  4 mg Intravenous Q6H PRN Vianne Bulls, MD      . ondansetron (ZOFRAN) tablet 4 mg  4 mg Oral Q6H PRN Vianne Bulls, MD       Or  . ondansetron (ZOFRAN) injection 4 mg  4 mg Intravenous Q6H PRN Vianne Bulls, MD      . oxyCODONE (Oxy IR/ROXICODONE) immediate release tablet 5 mg  5 mg Oral TID PRN Vianne Bulls, MD   5 mg at 10/30/16 1325  . pantoprazole (PROTONIX) EC tablet 40 mg  40 mg Oral Daily Vianne Bulls, MD   40 mg at 10/30/16 1113  . pentafluoroprop-tetrafluoroeth (GEBAUERS) aerosol 1 application  1 application Topical PRN Manpreet Toya Smothers, MD      . polyethylene glycol (MIRALAX / GLYCOLAX) packet 17 g  17 g Oral Daily PRN Vianne Bulls, MD      . sevelamer carbonate (RENVELA) tablet 800 mg  800 mg Oral TID WC Timothy S Opyd, MD      . sodium chloride flush (NS) 0.9 % injection 3 mL  3 mL Intravenous Q12H Ilene Qua Opyd, MD   3 mL at 10/30/16 1000  . sodium chloride flush (NS) 0.9 % injection 3 mL  3 mL Intravenous Q12H Timothy S Opyd, MD      . sodium chloride flush (NS) 0.9 % injection 3 mL  3 mL Intravenous PRN Vianne Bulls, MD        Allergies as  of 10/29/2016 - Review Complete 10/29/2016  Allergen Reaction Noted  . Lasix [furosemide] Other (See Comments) 04/12/2014  . Latex Itching 05/14/2015    Family History  Problem Relation Age of Onset  . Heart disease Mother   . Colon cancer Neg Hx   . Liver disease Neg Hx     Social History   Social History  . Marital status: Single    Spouse name: N/A  . Number of children: 1  . Years of education: N/A   Occupational History  . unemployed    Social History Main Topics  . Smoking status: Former Smoker    Packs/day: 0.25    Years: 20.00    Types: Cigarettes    Quit date: 11/05/2008  . Smokeless tobacco: Never Used     Comment: Never really smoked much  . Alcohol use No  . Drug use: No  . Sexual activity: Not Currently    Partners: Male    Birth control/ protection: None   Other Topics Concern  . Not on file   Social History Narrative  . No narrative on file    Review of Systems: General: Negative for anorexia, weight loss, fever, chills.  ENT: Negative for hoarseness, difficulty swallowing. CV: Negative for chest pain, angina, palpitations, peripheral edema.  Respiratory: Negative for dyspnea at rest, cough, sputum, wheezing.  GI: See history of present illness. Derm: Negative for rash or itching.  Neuro: Negative for memory loss, confusion.  Endo: Negative for unusual weight change.  Heme: Negative for bruising or bleeding. Allergy: Negative for rash or hives.  Physical Exam: Vital signs in last 24 hours: Temp:  [98 F (36.7 C)-98.7 F (37.1 C)] 98.7 F (37.1 C) (03/23 1210) Pulse Rate:  [77-87] 86 (03/23 1315) Resp:  [16-20] 18 (03/23 1210) BP: (98-129)/(52-68) 110/52 (03/23 1315) SpO2:  [96 %-100 %] 100 % (03/23 1210) Weight:  [146 lb 8 oz (66.5 kg)-170 lb (77.1 kg)] 146 lb 9.7 oz (66.5 kg) (03/23 1210)   General:  Alert/sleepy, Well-developed, well-nourished, pleasant and cooperative in NAD Head:  Normocephalic and atraumatic. Eyes:  Sclera  clear, no icterus. Conjunctiva pink. Ears:  Normal auditory acuity. Neck:  Supple; no masses or thyromegaly. Lungs:  Clear throughout to auscultation.  No wheezes, crackles, or rhonchi. No acute distress. Heart:  Regular rate and rhythm; no murmurs, clicks, rubs, or gallops. Abdomen:  Soft, and nondistended. Umbilical hernia noted soft. Moderate to severe generalized TTP. Normal bowel sounds, without guarding, and without rebound.   Rectal:  Deferred.   Msk:  Symmetrical without gross deformities. Extremities:  With 2-3+ bilateral LE pitting edema. Neurologic:  Alert and  oriented x4;  grossly normal neurologically. Skin:  Intact without significant lesions or rashes. Psych:  Seems sedated after pain medication  Intake/Output from previous day: 03/22 0701 - 03/23 0700 In: 50 [IV Piggyback:50] Out: -  Intake/Output this shift: No intake/output data recorded.  Lab Results:  Recent Labs  10/29/16 2149  WBC 6.1  HGB 8.9*  HCT 26.4*  PLT 88*   BMET  Recent Labs  10/29/16 2149 10/30/16 0609  NA 133* 133*  K 4.1 4.3  CL 96* 94*  CO2 19* 21*  GLUCOSE 91 103*  BUN 99* 103*  CREATININE 13.90* 14.05*  CALCIUM 8.5* 8.5*   LFT  Recent Labs  10/29/16 2149 10/30/16 0609  PROT 7.1 6.6  ALBUMIN 3.2* 3.0*  AST 54* 51*  ALT 27 26  ALKPHOS 109 99  BILITOT 1.0 0.9   PT/INR  Recent Labs  10/30/16 0609  LABPROT 14.3  INR 1.11   Hepatitis Panel No results for input(s): HEPBSAG, HCVAB, HEPAIGM, HEPBIGM in the last 72 hours. C-Diff No results for input(s): CDIFFTOX in the last 72 hours.  Studies/Results: Ct Abdomen Pelvis W Contrast  Result Date: 10/30/2016 CLINICAL DATA:  Diffuse abdominal pain. EXAM: CT ABDOMEN AND PELVIS WITH CONTRAST TECHNIQUE: Multidetector CT imaging of the abdomen and pelvis was performed using the standard protocol following bolus administration of intravenous contrast. CONTRAST:  60mL ISOVUE-300 IOPAMIDOL (ISOVUE-300) INJECTION 61%, 55mL  ISOVUE-300 IOPAMIDOL (ISOVUE-300) INJECTION 61% COMPARISON:  CT abdomen pelvis 09/19/2016 FINDINGS: Lower chest: No pulmonary nodules. No visible pleural or pericardial effusion. Hepatobiliary: There is hypertrophy of the left hepatic lobe and caudate with a diffusely nodular hepatic contour. There is a large amount of perihepatic ascites that tracks into the right paracolic gutter and pelvis. There is cholelithiasis. No focal liver lesions. The portal vein is patent. Pancreas: Normal pancreatic contours and enhancement. No peripancreatic fluid collection or pancreatic ductal dilatation. Spleen: The spleen is mildly enlarged. There is a large amount of perisplenic ascites tracking down the left pericolic gutter into the pelvis. Adrenals/Urinary Tract: Normal adrenal glands. Both kidneys are atrophic. There is a dilated vein connecting the right renal vein and superior mesenteric vein. Stomach/Bowel: No abnormal bowel dilatation. No bowel wall thickening or adjacent fat stranding to indicate acute inflammation. No abdominal fluid collection. Vascular/Lymphatic: There is atherosclerotic calcification of the non aneurysmal abdominal aorta. The portal vein, superior mesenteric vein and splenic vein are patent. No abdominal or pelvic adenopathy. Reproductive: Normal uterus and ovaries. Musculoskeletal: No lytic or blastic osseous lesion. Normal visualized extrathoracic and extraperitoneal soft tissues. Other: There is an umbilical hernia that is fluid filled. IMPRESSION: 1. Hepatic cirrhosis and findings of portal hypertension, including large volume ascites throughout the abdomen, extending along both paracolic gutters into the pelvis. 2. Cholelithiasis. 3. Aortic atherosclerosis. Electronically Signed   By: Ulyses Jarred M.D.   On: 10/30/2016 02:05  US Paracentesis  Result Date: 10/30/2016 INDICATION: Cirrhosis, ascites EXAM: ULTRASOUND GUIDED DIAGNOSTIC AND THERAPEUTIC PARACENTESIS MEDICATIONS: None.  COMPLICATIONS: None immediate. PROCEDURE: Procedure, benefits, and risks of procedure were discussed with patient. Written informed consent for procedure was obtained. Time out protocol followed. Adequate collection of ascites localized by ultrasound in RIGHT lower quadrant. Skin prepped and draped in usual sterile fashion. Skin and soft tissues anesthetized with 10 mL of 1% lidocaine. 5 [place] Yueh catheter placed into peritoneal cavity. 2.7 L of dark yellow ascitic fluid aspirated by vacuum bottle suction. Procedure tolerated well by patient without immediate complication. FINDINGS: A total of approximately 2.7 L of ascitic fluid was removed. Samples were sent to the laboratory as requested by the clinical team. IMPRESSION: Successful ultrasound-guided paracentesis yielding 2.7 liters of peritoneal fluid. Electronically Signed   By: Lavonia Dana M.D.   On: 10/30/2016 11:11    Impression: 41 year old female well known to our practice with end stage kidney disease and partially compensated NASH cirrhosis. Patient presented with insidious and worsening generalized abdominal pain to the point of being severe. Given a dose of Zosyn for possible SBP. Paracentesis today with 2.7 L dark ascites, cell count pending, Gm stain negative, other labs pending. Remains anemia hgb 8.9 (within patient baseline) and plt 88 (also within baseline). Pitting edema bilateral LE. Significant abdominal pain on exam, no improvement after paracentesis.  Plan: 1. Monitor for ascites fluid labs 2. IF labs are diagnostic for SBP can treat with: Rocephin 1 g daily x 7 days  3. Pain and nausea management per hospitalist 4. Supportive measures   Thank you for allowing [place] to participate in the care of Dmya M Pratte  Walden Field, DNP, AGNP-C Adult & Gerontological Nurse Practitioner [facility]    LOS: 0 days     10/30/2016, 1:44 PM

## 2016-10-30 NOTE — Care Management Note (Signed)
Case Management Note  Patient Details  Name: Wanda [name] MRN: [id number] Date of Birth: [birth year]  Subjective/Objective:                  Pt admitted with ascites. Pt from home, lives with spouse and is ind with ADL's. She receives OP HD.  She has no HH or DME needs pta. She has PCP, transportation to appointments and insurance with drug coverage.   Action/Plan: Plan for DC home with self care. No needs communicated.  Expected Discharge Date:    11/01/2016              Expected Discharge Plan:  Home/Self Care  In-House Referral:  NA  Discharge planning Services  CM Consult  Post Acute Care Choice:  NA Choice offered to:  NA  Status of Service:  Completed, signed off  Sherald Barge, RN 10/30/2016, 10:07 AM

## 2016-10-30 NOTE — H&P (Signed)
History and Physical    SHAHANA CAPES KDT:[id number] DOB: September 30, 1975 DOA: 10/29/2016  PCP: [name], MD   Patient coming from: Home  Chief Complaint: Severe abdominal pain  HPI: Wanda [name] is a 41 y.o. 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 Patient reports that she had been in her usual state of health until noting the insidious development of abdominal pain approximately 3 days ago. Over this interval, the pain progressively worsened and became severe. She denies any recent fevers or chills and denies any significant nausea, vomiting, or diarrhea. She describes her pain as severe, constant, generalized throughout the abdomen, sharp, and without any alleviating or exacerbating factors identified. She describes the pain as similar to what she has experienced in the past. She denies chest pain or palpitations and denies cough or dyspnea. She reports missing her dialysis session on 10/29/2016 due to her severe abdominal pain.  ED Course: Upon arrival to the ED, patient is found to be afebrile, saturating well on room air, and with vital signs stable. Chemistry panels notable for a sodium of 133 and bicarbonate of 19. CBC features a stable normocytic anemia with hemoglobin of 8.9 and a stable thrombocytopenia with platelets 88,000. CT of the abdomen pelvis is notable for findings of hepatic cirrhosis and portal hypertension with large volume ascites throughout the abdomen. Blood cultures were obtained and the patient was treated with multiple doses of IV fentanyl. She was given an empiric dose of Zosyn for possible SBP. ED physician consider performing a paracentesis at the bedside, but cannot find an acceptable area to approach. Patient remained hemodynamically stable and not in any pain or respiratory  distress, but has considerable abdominal pain concerning for possible SBP and she will be observed on the telemetry unit for ongoing evaluation and management of this.  Review of Systems:  All other systems reviewed and apart from HPI, are negative.  Past Medical History:  Diagnosis Date  . Acute blood loss anemia 02/25/2014   Status post transfusion  . Acute renal failure ([place]) 09/2013   Pre-renal- resolved  . Anasarca 10/10/2013  . Anxiety   . Bipolar disorder ([place]) 12/04/2013   2007-SEEN IN ED FOR INVOLUNTARY COMMITMENT, UDS POS FOR AMPHETAMINES/OPIATES   . Bleeding esophageal varices ([place]) 02/28/2014   s/p banding  . C. difficile colitis 04/19/2014  . Chronic hypotension   . Cirrhosis ([place]) 10/05/13   Liver bx 11/23/13 (delayed initially due to patient refusal). c/w steatohepatitis  . Cirrhosis of liver with ascites ([place])   . Depression   . ESRD (end stage renal disease) on dialysis ([place]) 08/2014  . Folate deficiency 09/2013  . Gastroesophageal junction ulcer 09/17/2014  . GERD (gastroesophageal reflux disease)   . Hematemesis/vomiting blood 02/24/2014  . Macrocytosis 02/28/2014  . PNA (pneumonia) 10/13/2013  . SBP (spontaneous bacterial peritonitis) ([place]) 11/10/2013  . Thrombocytopenia ([place])    Hypercellular bone marrow; abundant megakaryocytes per 08/27/2014; s/p bone marrow bx    Past Surgical History:  Procedure Laterality Date  . AV FISTULA PLACEMENT Right 11/16/2014   Procedure: Right arm Creation of arteriovenous fistula;  Surgeon: Angelia Mould, MD;  Location: [place];  Service: Vascular;  Laterality: Right;  . CENTRAL VENOUS CATHETER INSERTION Right   . COLONOSCOPY N/A 12/19/2013   SLF:NO OBVIOUS SOURCE FOR ANEMIA IDETIFIED/ONE COLON POLYP REMOVED/Small internal hemorrhoids  . ESOPHAGEAL  BANDING  07/04/2014   Procedure: ESOPHAGEAL BANDING;  Surgeon: Daneil Dolin, MD;  [facility];  Service: Endoscopy;;  . ESOPHAGEAL BANDING N/A 07/24/2014   Procedure: ESOPHAGEAL  BANDING (2 bands applied);  Surgeon: Danie Binder, MD;  Location: [facility];  Service: Endoscopy;  Laterality: N/A;  . ESOPHAGEAL BANDING N/A 07/23/2015   Procedure: ESOPHAGEAL BANDING;  Surgeon: Danie Binder, MD;  [facility];  Service: Endoscopy;  Laterality: N/A;  . ESOPHAGEAL BANDING N/A 03/04/2016   Procedure: ESOPHAGEAL BANDING;  Surgeon: Danie Binder, MD;  [facility];  Service: Endoscopy;  Laterality: N/A;  . ESOPHAGEAL BANDING N/A 04/30/2016   Procedure: ESOPHAGEAL BANDING;  Surgeon: Daneil Dolin, MD;  [facility];  Service: Endoscopy;  Laterality: N/A;  . ESOPHAGOGASTRODUODENOSCOPY N/A 11/14/2013   SLF:1 column of very small varices in distal esopahgus/MODERATE PORTAL GASTROPATHY IN PROXIMAL STOMACH/MODERATE erosive gastritis  . ESOPHAGOGASTRODUODENOSCOPY N/A 02/11/2014   Dr. Rourk:Esophageal varices with bleeding stigmata-status post esophageal band ligation therapy. Portal gastropathy  . ESOPHAGOGASTRODUODENOSCOPY N/A 07/04/2014   RMR: Persiting grade 2 esophageal varicies with bleeding stigmata status post band ligation. Significantly congested gastric mucosa iwith changes constistant with protal gastropathy.   . ESOPHAGOGASTRODUODENOSCOPY N/A 09/16/2014   Rehman: Single short column of varix proximal to GE junction not large enough to be banded. Two amall ulcers at the GEJ felt to be source of GI Bleeding but no active bleeding but no actibe bleeding noted. No therapy rendered. Portal gastropathy NO evidence of peptic ulcer diease or gastric varices.   . ESOPHAGOGASTRODUODENOSCOPY N/A 12/14/2014   SLF: Grade ! esophageal varices. 2. Moderate Portal Gastropathy  . ESOPHAGOGASTRODUODENOSCOPY N/A 03/27/2015   Procedure: ESOPHAGOGASTRODUODENOSCOPY (EGD);  Surgeon: Danie Binder, MD;  [facility];  Service: Endoscopy;  Laterality: N/A;  1045am - moved to 817 @ 11:30  . ESOPHAGOGASTRODUODENOSCOPY N/A 06/05/2015   Procedure:  ESOPHAGOGASTRODUODENOSCOPY (EGD);  Surgeon: Clarene Essex, MD;  Location[facility];  Service: Endoscopy;  Laterality: N/A;  . ESOPHAGOGASTRODUODENOSCOPY N/A 07/23/2015   SLF:1. Grade 1 esophageal varices 2. Moderate portal hypertensive gastropathy 3. MILd non-erosive gastritis.   [name] ESOPHAGOGASTRODUODENOSCOPY N/A 03/04/2016   Dr. Oneida Alar: grade 1 varices, surveillance in Jan 2017   . ESOPHAGOGASTRODUODENOSCOPY N/A 05/21/2016   Procedure: ESOPHAGOGASTRODUODENOSCOPY (EGD);  Surgeon: Gatha Mayer, MD;  Location: [facility] END[facility];  Service: Endoscopy;  Laterality: N/A;  . ESOPHAGOGASTRODUODENOSCOPY (EGD) WITH PROPOFOL N/A 07/24/2014   SLF:  1. 2 columns grade 2-3 varices- 2 Bands applied.  2.  Moderate gastropathy 3. Duodenal Diverticula  . ESOPHAGOGASTRODUODENOSCOPY (EGD) WITH PROPOFOL N/A 10/26/2014   RMR: 2 columns of grade 2 esophageal varices without obvious bleeding stigmata status post band ligation to complet obliteration of remaining varices.   . ESOPHAGOGASTRODUODENOSCOPY (EGD) WITH PROPOFOL N/A 04/30/2016   Procedure: ESOPHAGOGASTRODUODENOSCOPY (EGD) WITH PROPOFOL;  Surgeon: Daneil Dolin, MD;  [facility];  Service: Endoscopy;  Laterality: N/A;  . None    . PARACENTESIS  Feb 2015   1180 fluid, negative fluid analysis.   [name] PARACENTESIS  10/2013     reports that she quit smoking about 7 years ago. Her smoking use included Cigarettes. She has a 5.00 pack-year smoking history. She has never used smokeless tobacco. She reports that she does not drink alcohol or use drugs.  Allergies  Allergen Reactions  . Lasix [Furosemide] Other (See Comments)    "doesn't work"  . Latex Itching    Family History  Problem Relation Age of Onset  . Heart disease Mother   . Colon cancer Neg Hx   . Liver disease Neg Hx      Prior to Admission medications   Medication Sig Start Date End Date Taking? Authorizing Provider  clotrimazole (LOTRIMIN) 1 % cream Apply 1 application topically 3  (three) times daily. 09/30/16   Danie Binder, MD  Darbepoetin Alfa (ARANESP) 60 MCG/0.3ML SOSY injection Inject 0.3 mLs (60 mcg total) into the vein every Thursday with hemodialysis. Patient taking differently: Inject 60 mcg into the vein every Tuesday with hemodialysis.  06/09/15   Cherene Altes, MD  lactulose (CHRONULAC) 10 GM/15ML solution Take 45 mLs (30 g total) by mouth 3 (three) times daily. TAKE 15-30 ml BY MOUTH three times a day Patient taking differently: Take 10-20 g by mouth 3 (three) times daily. TAKE 15-30 ml BY MOUTH three times a day 09/27/16   Cherene Altes, MD  lidocaine (XYLOCAINE) 2 % solution TAKE TWO TEASPOONSFUL (10ML) BY MOUTH BEFORE MEALS AND AT BEDTIME TO PREVENT CHEST PAIN WHILE EATING 03/19/16   Carlis Stable, NP  lidocaine-prilocaine (EMLA) cream Apply 1 application topically as needed (for dialysis treatments).    Historical Provider, MD  LORazepam (ATIVAN) 1 MG tablet Take 1 mg by mouth 2 (two) times daily as needed for anxiety (and/or back spasms).    Historical Provider, MD  midodrine (PROAMATINE) 10 MG tablet Take 1 tablet (10 mg total) by mouth every morning. Patient taking differently: Take 10 mg by mouth See admin instructions. Take ONLY on dialysis days 09/28/16   Cherene Altes, MD  omeprazole (PRILOSEC) 20 MG capsule 1 po every morning 30 minutes prior to your first meal. Patient taking differently: Take 20 mg by mouth daily before breakfast. 1 po every morning 30 minutes prior to your first meal. 09/30/16   Danie Binder, MD  Oxycodone HCl 10 MG TABS Take 0.5 tablets (5 mg total) by mouth 3 (three) times daily as needed. 1 PO TID AS NEEDED FOR PAIN Patient taking differently: Take 5-10 mg by mouth 3 (three) times daily as needed.  09/27/16   Cherene Altes, MD  RENVELA 800 MG tablet Take 1600 mg by mouth 3 times daily with meals. Take 800 mg by mouth with snacks. 10/29/15   Historical Provider, MD    Physical Exam: Vitals:   10/29/16 1947 10/29/16  1948 10/30/16 0230 10/30/16 0242  BP: 129/68  108/62 (!) 98/52  Pulse: 87  87 77  Resp: 18   16  Temp: 98 F (36.7 C)  98 F (36.7 C)   TempSrc: Oral  Oral   SpO2: 100%  99% 98%  Weight:  77.1 kg (170 lb)    Height:  5\' 1"  (1.549 m)        Constitutional: NAD, calm, appears uncomfortable, chronically-ill appearing Eyes: PERTLA, lids and conjunctivae normal ENMT: Mucous membranes are moist. Posterior pharynx clear of any exudate or lesions.   Neck: normal, supple, no masses, no thyromegaly Respiratory: clear to auscultation bilaterally, no wheezing, no crackles. Normal respiratory effort.    Cardiovascular: S1 & S2 heard, regular rate and rhythm, soft systolic murmur at USB. Marked bilateral LE edema.  Abdomen: Distended but soft, exquisite tenderness throughout, soft umbilical hernia. Bowel sounds normal.  Musculoskeletal: no clubbing / cyanosis. No joint deformity upper and lower extremities.    Skin: Erythema overlies lower abdominal wall and bilateral LE's distally. Warm, dry, well-perfused. Neurologic: CN 2-12 grossly  intact. Sensation intact, DTR normal. Strength 5/5 in all 4 limbs.  Psychiatric:  Alert and oriented x 3. Intermittently coopearative.     Labs on Admission: I have personally reviewed following labs and imaging studies  CBC:  Recent Labs Lab 10/29/16 2149  WBC 6.1  HGB 8.9*  HCT 26.4*  MCV 98.5  PLT 88*   Basic Metabolic Panel:  Recent Labs Lab 10/29/16 2149  NA 133*  K 4.1  CL 96*  CO2 19*  GLUCOSE 91  BUN 99*  CREATININE 13.90*  CALCIUM 8.5*   GFR: Estimated Creatinine Clearance: 5 mL/min (A) (by C-G formula based on SCr of 13.9 mg/dL (H)). Liver Function Tests:  Recent Labs Lab 10/29/16 2149  AST 54*  ALT 27  ALKPHOS 109  BILITOT 1.0  PROT 7.1  ALBUMIN 3.2*    Recent Labs Lab 10/29/16 2149  LIPASE 108*    Recent Labs Lab 10/30/16 0021  AMMONIA 32   Coagulation Profile: No results for input(s): INR, PROTIME in the  last 168 hours. Cardiac Enzymes: No results for input(s): CKTOTAL, CKMB, CKMBINDEX, TROPONINI in the last 168 hours. BNP (last 3 results) No results for input(s): PROBNP in the last 8760 hours. HbA1C: No results for input(s): HGBA1C in the last 72 hours. CBG: No results for input(s): GLUCAP in the last 168 hours. Lipid Profile: No results for input(s): CHOL, HDL, LDLCALC, TRIG, CHOLHDL, LDLDIRECT in the last 72 hours. Thyroid Function Tests: No results for input(s): TSH, T4TOTAL, FREET4, T3FREE, THYROIDAB in the last 72 hours. Anemia Panel: No results for input(s): VITAMINB12, FOLATE, FERRITIN, TIBC, IRON, RETICCTPCT in the last 72 hours. Urine analysis:    Component Value Date/Time   COLORURINE RED (A) 09/23/2016 0805   APPEARANCEUR CLOUDY (A) 09/23/2016 0805   LABSPEC 1.015 09/23/2016 0805   PHURINE 6.5 09/23/2016 0805   GLUCOSEU NEGATIVE 09/23/2016 0805   HGBUR LARGE (A) 09/23/2016 0805   BILIRUBINUR MODERATE (A) 09/23/2016 0805   KETONESUR 15 (A) 09/23/2016 0805   PROTEINUR 100 (A) 09/23/2016 0805   UROBILINOGEN 0.2 08/13/2014 2138   NITRITE POSITIVE (A) 09/23/2016 0805   LEUKOCYTESUR LARGE (A) 09/23/2016 0805   Sepsis Labs: @LABRCNTIP (procalcitonin:4,lacticidven:4) )No results found for this or any previous visit (from the past 240 hour(s)).   Radiological Exams on Admission: Ct Abdomen Pelvis W Contrast  Result Date: 10/30/2016 CLINICAL DATA:  Diffuse abdominal pain. EXAM: CT ABDOMEN AND PELVIS WITH CONTRAST TECHNIQUE: Multidetector CT imaging of the abdomen and pelvis was performed using the standard protocol following bolus administration of intravenous contrast. CONTRAST:  45mL ISOVUE-300 IOPAMIDOL (ISOVUE-300) INJECTION 61%, 31mL ISOVUE-300 IOPAMIDOL (ISOVUE-300) INJECTION 61% COMPARISON:  CT abdomen pelvis 09/19/2016 FINDINGS: Lower chest: No pulmonary nodules. No visible pleural or pericardial effusion. Hepatobiliary: There is hypertrophy of the left hepatic lobe and  caudate with a diffusely nodular hepatic contour. There is a large amount of perihepatic ascites that tracks into the right paracolic gutter and pelvis. There is cholelithiasis. No focal liver lesions. The portal vein is patent. Pancreas: Normal pancreatic contours and enhancement. No peripancreatic fluid collection or pancreatic ductal dilatation. Spleen: The spleen is mildly enlarged. There is a large amount of perisplenic ascites tracking down the left pericolic gutter into the pelvis. Adrenals/Urinary Tract: Normal adrenal glands. Both kidneys are atrophic. There is a dilated vein connecting the right renal vein and superior mesenteric vein. Stomach/Bowel: No abnormal bowel dilatation. No bowel wall thickening or adjacent fat stranding to indicate acute inflammation. No abdominal fluid collection. Vascular/Lymphatic: There is  atherosclerotic calcification of the non aneurysmal abdominal aorta. The portal vein, superior mesenteric vein and splenic vein are patent. No abdominal or pelvic adenopathy. Reproductive: Normal uterus and ovaries. Musculoskeletal: No lytic or blastic osseous lesion. Normal visualized extrathoracic and extraperitoneal soft tissues. Other: There is an umbilical hernia that is fluid filled. IMPRESSION: 1. Hepatic cirrhosis and findings of portal hypertension, including large volume ascites throughout the abdomen, extending along both paracolic gutters into the pelvis. 2. Cholelithiasis. 3. Aortic atherosclerosis. Electronically Signed   By: Ulyses Jarred M.D.   On: 10/30/2016 02:05    EKG: Not performed, will obtain as appropriate.   Assessment/Plan  1. Acute abdominal pain  - Pt presents with severe generalized abd pain  - No fever or leukocytosis noted; CT abd/pelvis with large-volume ascites, but no other acute pathology identified  - SBP is considered and ED physician considered a bedside paracentesis, but could not find pocket to approach  - Blood cultures were taken and she  was treated with an empiric dose of Zosyn  - US-guided paracentesis requested with fluid studies including gram stain and culture; albumin ordered   - Continue empiric Rocephin, prn analgesia    2. Cirrhosis with ascites  - Attributed to NASH  - Large-volume ascites as noted above; paracentesis ordered  - Continue lactulose, PPI; monitor lytes    3. ESRD  - Pt is dialyzed through AVF in RUE  - Reports missing HD on 10/29/16 due to severe abd pain  - Potassium okay, mild acidosis, no respiratory distress on admission - Nephrology consulted for dialysis - Continue fluid-restriction and binders  4. Normocytic anemia, thrombocytopenia  - Hgb is 8.9 on admission; there is no bleeding evident and this is stable from priors  - Platelets 88k on admission and stable without any evident bleeding  - Likely secondary to chronic liver and kidney disease; nephrology managing with Aranesp   5. Hyponatremia  - Serum sodium 133 on admission in setting of volume-excess and cirrhosis  - Nephrology consulted for HD  - Repeat chemistries tomorrow     DVT prophylaxis: sq heparin  Code Status: Full  Family Communication: Discussed with patient Disposition Plan: Observe on telemetry Consults called: Nephrology Admission status: Observation    Vianne Bulls, MD [facility] Pager [phone]  If 7PM-7AM, please contact night-coverage [email or web address] Password [facility]  10/30/2016, 3:58 AM

## 2016-10-30 NOTE — Consult Note (Addendum)
Wanda [name] MRN: [id number] DOB/AGE: [birth year] 41 y.o. Primary Care Physician:ROBERSON, [name], MD Admit date: 10/29/2016 Chief Complaint:  Chief Complaint  Patient presents with  . Abdominal Pain   HPI: Pt is 41 year old caucasian female with past medical hx of ESRD who came to ER with c/o abdominal pain.  HPI dates back to 3--4 days as pt has having abdominal pain, generalized ,progresive and  patient  came to Emergency Room. Pt offers No fevers.  Pt  c/o  Nausea and vomiting NO c/o diarrhea. NO c/o hematemesis NO c/o syncope. Pt is on dialysis Tues/Thurs/Sat schedule. Pt missed her Hd yesterday. Pt is currently admitted for treatment of of SBP.   Past Medical History:  Diagnosis Date  . Acute blood loss anemia 02/25/2014   Status post transfusion  . Acute renal failure ([place]) 09/2013   Pre-renal- resolved  . Anasarca 10/10/2013  . Anxiety   . Bipolar disorder ([place]) 12/04/2013   2007-SEEN IN ED FOR INVOLUNTARY COMMITMENT, UDS POS FOR AMPHETAMINES/OPIATES   . Bleeding esophageal varices ([place]) 02/28/2014   s/p banding  . C. difficile colitis 04/19/2014  . Chronic hypotension   . Cirrhosis ([place]) 10/05/13   Liver bx 11/23/13 (delayed initially due to patient refusal). c/w steatohepatitis  . Cirrhosis of liver with ascites ([place])   . Depression   . ESRD (end stage renal disease) on dialysis ([place]) 08/2014  . Folate deficiency 09/2013  . Gastroesophageal junction ulcer 09/17/2014  . GERD (gastroesophageal reflux disease)   . Hematemesis/vomiting blood 02/24/2014  . Macrocytosis 02/28/2014  . PNA (pneumonia) 10/13/2013  . SBP (spontaneous bacterial peritonitis) ([place]) 11/10/2013  . Thrombocytopenia ([place])    Hypercellular bone marrow; abundant megakaryocytes per 08/27/2014; s/p bone marrow bx        Family History  Problem Relation Age of Onset  . Heart disease Mother   . Colon cancer Neg Hx   . Liver disease Neg Hx     Social History:  reports that she quit smoking about 7  years ago. Her smoking use included Cigarettes. She has a 5.00 pack-year smoking history. She has never used smokeless tobacco. She reports that she does not drink alcohol or use drugs.   Allergies:  Allergies  Allergen Reactions  . Lasix [Furosemide] Other (See Comments)    "doesn't work"  . Latex Itching    Medications Prior to Admission  Medication Sig Dispense Refill  . clotrimazole (LOTRIMIN) 1 % cream Apply 1 application topically 3 (three) times daily. 30 g 3  . Darbepoetin Alfa (ARANESP) 60 MCG/0.3ML SOSY injection Inject 0.3 mLs (60 mcg total) into the vein every Thursday with hemodialysis. (Patient taking differently: Inject 60 mcg into the vein every Tuesday with hemodialysis. ) 4.2 mL   . lactulose (CHRONULAC) 10 GM/15ML solution Take 45 mLs (30 g total) by mouth 3 (three) times daily. TAKE 15-30 ml BY MOUTH three times a day (Patient taking differently: Take 10-20 g by mouth 3 (three) times daily. TAKE 15-30 ml BY MOUTH three times a day) 1892 mL 5  . lidocaine (XYLOCAINE) 2 % solution TAKE TWO TEASPOONSFUL (10ML) BY MOUTH BEFORE MEALS AND AT BEDTIME TO PREVENT CHEST PAIN WHILE EATING 500 mL 1  . lidocaine-prilocaine (EMLA) cream Apply 1 application topically as needed (for dialysis treatments).    . LORazepam (ATIVAN) 1 MG tablet Take 1 mg by mouth 2 (two) times daily as needed for anxiety (and/or back spasms).    . midodrine (PROAMATINE) 10 MG tablet Take 1  tablet (10 mg total) by mouth every morning. (Patient taking differently: Take 10 mg by mouth See admin instructions. Take ONLY on dialysis days)    . omeprazole (PRILOSEC) 20 MG capsule 1 po every morning 30 minutes prior to your first meal. (Patient taking differently: Take 20 mg by mouth daily before breakfast. 1 po every morning 30 minutes prior to your first meal.) 31 capsule 11  . Oxycodone HCl 10 MG TABS Take 0.5 tablets (5 mg total) by mouth 3 (three) times daily as needed. 1 PO TID AS NEEDED FOR PAIN (Patient taking  differently: Take 5-10 mg by mouth 3 (three) times daily as needed. ) 1 tablet 0  . RENVELA 800 MG tablet Take 1600 mg by mouth 3 times daily with meals. Take 800 mg by mouth with snacks.  5       VZC:HYIFO from the symptoms mentioned above,there are no other symptoms referable to all systems reviewed.  . cefTRIAXone (ROCEPHIN)  IV  2 g Intravenous Q24H  . [START ON 10/31/2016] epoetin (EPOGEN/PROCRIT) injection  8,000 Units Subcutaneous Q T,Th,Sa-HD  . heparin  5,000 Units Subcutaneous Q8H  . HYDROmorphone      . lactulose  30 g Oral TID  . lidocaine (PF)      . pantoprazole  40 mg Oral Daily  . sevelamer carbonate  800 mg Oral TID WC  . sodium chloride flush  3 mL Intravenous Q12H  . sodium chloride flush  3 mL Intravenous Q12H     Physical Exam: Vital signs in last 24 hours: Temp:  [98 F (36.7 C)-98.7 F (37.1 C)] 98.7 F (37.1 C) (03/23 1210) Pulse Rate:  [77-87] 86 (03/23 1315) Resp:  [16-20] 18 (03/23 1210) BP: (98-129)/(52-68) 110/52 (03/23 1315) SpO2:  [96 %-100 %] 100 % (03/23 1210) Weight:  [146 lb 8 oz (66.5 kg)-170 lb (77.1 kg)] 146 lb 9.7 oz (66.5 kg) (03/23 1210) Weight change:     Intake/Output from previous day: 03/22 0701 - 03/23 0700 In: 50 [IV Piggyback:50] Out: -  No intake/output data recorded.   Physical Exam: General- pt is awake,alert, oriented to time place and person Resp- No acute REsp distress, decreased at bases CVS- S1S2 regular in rate and rhythm GIT- BS+, soft, umbilical hernia, tenderness over erythema site EXT- 2+ LE Edema,NO Cyanosis CNS- CN 2-12 grossly intact. Moving all 4 extremities Psych- normal mood and affect Access-  AVF   Lab Results: CBC  Recent Labs  10/29/16 2149  WBC 6.1  HGB 8.9*  HCT 26.4*  PLT 88*    BMET  Recent Labs  10/29/16 2149 10/30/16 0609  NA 133* 133*  K 4.1 4.3  CL 96* 94*  CO2 19* 21*  GLUCOSE 91 103*  BUN 99* 103*  CREATININE 13.90* 14.05*  CALCIUM 8.5* 8.5*     MICRO Recent Results (from the past 240 hour(s))  Culture, blood (Routine X 2) w Reflex to ID Panel     Status: None (Preliminary result)   Collection Time: 10/30/16 12:21 AM  Result Value Ref Range Status   Specimen Description BLOOD LEFT HAND  Final   Special Requests NONE 6CC  Final   Culture NO GROWTH < 12 HOURS  Final   Report Status PENDING  Incomplete  Culture, blood (Routine X 2) w Reflex to ID Panel     Status: None (Preliminary result)   Collection Time: 10/30/16 12:33 AM  Result Value Ref Range Status   Specimen Description BLOOD LEFT HAND  Final   Special Requests BOTTLES DRAWN AEROBIC AND ANAEROBIC 6CC EACH  Final   Culture NO GROWTH < 12 HOURS  Final   Report Status PENDING  Incomplete      Lab Results  Component Value Date   CALCIUM 8.5 (L) 10/30/2016   CAION 0.90 (L) 09/22/2016   PHOS 8.9 (H) 10/09/2016      Impression: 1)RenalESRD  HD started on  08/18/14  Pt is on Tue/Thurs/Saturday schedule   Will dialyze today                Pt does have hx of non adherence to dialysis               2)CVS- Hemodynamically fragile  BP low  On Midodrine   3)Anemia IN ESRD the goal for HGb is  9--11 Pt hgb is near the goal  Will keep on  epo    4)Liver- Cirrhosis Sec to Steato hepatitis Admitted with SBP Primary team following  5)Electrolytes Normokalemia  Hyponatremia  Cirrhosis + ESRD   6)ID-admitted with UGI bleed On IV AbX as recommended by Pul Critical care   7)Acid base Co2 not  at goal  8) Thrombocytopenia-Stable Primary MD following    Plan:  Will dialzye today May need again in am  Will keep on Midodrine Will use 2 k bath Will keep on epo May need albumin for hypotension during hd.    Addendum Pt seen on HD   Shelita Steptoe S 10/30/2016, 1:23 PM

## 2016-10-30 NOTE — Procedures (Signed)
   HEMODIALYSIS TREATMENT NOTE:  3 hours 10 minutes HD completed via right upper arm aVF (15g/antegrade). Goal met: 2 liters removed without interruption in ultrafiltration.  Pt ended HD session 20 minutes early/AMA due to need to move bowels. All blood was returned and hemostasis was achieved within 10 minutes. Report called to Karolee Ohs, RN.  Rockwell Alexandria, RN, CDN

## 2016-10-30 NOTE — Care Management Obs Status (Signed)
[facility] NOTIFICATION   Patient Details  Name: Wanda [name] MRN: [id number] Date of Birth: 1976-02-03   Medicare Observation Status Notification Given:  Yes    Sherald Barge, RN 10/30/2016, 10:05 AM

## 2016-10-30 NOTE — Discharge Summary (Signed)
Physician Discharge Summary  Wanda [name] SVX:[id number] DOB: 01-26-1976 DOA: 10/29/2016  PCP: [name], MD  Admit date: 10/29/2016 Discharge date: 10/30/2016  Time spent: 45 minutes  Recommendations for Outpatient Follow-up:  -Will be discharged home today. -Advised to follow up with her HD center as scheduled.   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 Myna Hidalgo on 3/23: Wanda [name] is a 41 y.o. 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 Patient reports that she had been in her usual state of health until noting the insidious development of abdominal pain approximately 3 days ago. Over this interval, the pain progressively worsened and became severe. She denies any recent fevers or chills and denies any significant nausea, vomiting, or diarrhea. She describes her pain as severe, constant, generalized throughout the abdomen, sharp, and without any alleviating or exacerbating factors identified. She describes the pain as similar to what she has experienced in the past. She denies chest pain or palpitations and denies cough or dyspnea. She reports missing her dialysis session on 10/29/2016 due to her severe abdominal pain.  ED Course: Upon arrival to the ED, patient is found to be afebrile, saturating well on  room air, and with vital signs stable. Chemistry panels notable for a sodium of 133 and bicarbonate of 19. CBC features a stable normocytic anemia with hemoglobin of 8.9 and a stable thrombocytopenia with platelets 88,000. CT of the abdomen pelvis is notable for findings of hepatic cirrhosis and portal hypertension with large volume ascites throughout the abdomen. Blood cultures were obtained and the patient was treated with multiple doses of IV fentanyl. She was given an empiric dose of Zosyn for possible SBP. ED physician consider performing a paracentesis at the bedside, but cannot find an acceptable area to approach. Patient remained hemodynamically stable and not in any pain or respiratory distress, but has considerable abdominal pain concerning for possible SBP and she will be observed on the telemetry unit for ongoing evaluation and management of this.  Hospital Course:   Abdominal Pain -Resolved. -SBP considered but paracentesis fluid is not consistent with this diagnosis.  ESRD -Received HD prior to DC.  Rest of chronic conditions have been stable during this less than 12 hours hospitalization.  Procedures:  HD  Paracentesis   Consultations:  GI  Nephrology  Discharge Instructions  Discharge Instructions    Diet - low sodium heart healthy    Complete by:  As directed    Increase activity slowly    Complete by:  As directed      Allergies as of 10/30/2016      Reactions   Lasix [furosemide] Other (See Comments)   "doesn't work"   Latex Itching      Medication List    TAKE these medications   clotrimazole 1 %  cream Commonly known as:  LOTRIMIN Apply 1 application topically 3 (three) times daily.   Darbepoetin Alfa 60 MCG/0.3ML Sosy injection Commonly known as:  ARANESP Inject 0.3 mLs (60 mcg total) into the vein every Thursday with hemodialysis. What changed:  when to take this   lactulose 10 GM/15ML solution Commonly known as:  CHRONULAC Take 45 mLs (30 g  total) by mouth 3 (three) times daily. TAKE 15-30 ml BY MOUTH three times a day What changed:  how much to take  additional instructions   lidocaine 2 % solution Commonly known as:  XYLOCAINE TAKE TWO TEASPOONSFUL (10ML) BY MOUTH BEFORE MEALS AND AT BEDTIME TO PREVENT CHEST PAIN WHILE EATING   lidocaine-prilocaine cream Commonly known as:  EMLA Apply 1 application topically as needed (for dialysis treatments).   LORazepam 1 MG tablet Commonly known as:  ATIVAN Take 1 mg by mouth 2 (two) times daily as needed for anxiety (and/or back spasms).   midodrine 10 MG tablet Commonly known as:  PROAMATINE Take 1 tablet (10 mg total) by mouth every morning. What changed:  when to take this  additional instructions   omeprazole 20 MG capsule Commonly known as:  PRILOSEC 1 po every morning 30 minutes prior to your first meal. What changed:  how much to take  how to take this  when to take this  additional instructions   Oxycodone HCl 10 MG Tabs Take 0.5 tablets (5 mg total) by mouth 3 (three) times daily as needed. 1 PO TID AS NEEDED FOR PAIN What changed:  how much to take  additional instructions   RENVELA 800 MG tablet Generic drug:  sevelamer carbonate Take 1600 mg by mouth 3 times daily with meals. Take 800 mg by mouth with snacks.      Allergies  Allergen Reactions  . Lasix [Furosemide] Other (See Comments)    "doesn't work"  . Latex Itching   Follow-up Information    [name], MD. Schedule an appointment as soon as possible for a visit in 2 week(s).   Specialty:  Nurse Practitioner Contact information: [address] [place] [address] [phone]            The results of significant diagnostics from this hospitalization (including imaging, microbiology, ancillary and laboratory) are listed below for reference.    Significant Diagnostic Studies: Ct Head Wo Contrast  Result Date: 10/07/2016 CLINICAL DATA:  Seizure activity.   End-stage renal disease EXAM: CT HEAD WITHOUT CONTRAST TECHNIQUE: Contiguous axial images were obtained from the base of the skull through the vertex without intravenous contrast. COMPARISON:  September 22, 2016 FINDINGS: Brain: The ventricles are normal in size and configuration. There is no intracranial mass, hemorrhage, extra-axial fluid collection, or midline shift gray-white compartments are normal. No acute infarct evident. Vascular: No hyperdense vessel. Calcification is noted in the carotid siphon regions bilaterally. Skull: The bony calvarium appears intact. Sinuses/Orbits: Visualized paranasal sinuses are clear. Visualized orbits appear symmetric bilaterally. Other: Visualized mastoid air cells are clear. IMPRESSION: Vascular calcification in the carotid siphon regions. No intracranial mass, hemorrhage, or extra-axial fluid collection. Gray-white compartments are normal. Electronically Signed   By: Lowella Grip III M.D.   On: 10/07/2016 17:20   Ct Abdomen Pelvis W Contrast  Result Date: 10/30/2016 CLINICAL DATA:  Diffuse abdominal pain. EXAM: CT ABDOMEN AND PELVIS WITH CONTRAST TECHNIQUE: Multidetector CT imaging of the abdomen and pelvis was performed using the standard protocol following bolus administration of intravenous contrast. CONTRAST:  67mL ISOVUE-300 IOPAMIDOL (ISOVUE-300)  INJECTION 61%, 59mL ISOVUE-300 IOPAMIDOL (ISOVUE-300) INJECTION 61% COMPARISON:  CT abdomen pelvis 09/19/2016 FINDINGS: Lower chest: No pulmonary nodules. No visible pleural or pericardial effusion. Hepatobiliary: There is hypertrophy of the left hepatic lobe and caudate with a diffusely nodular hepatic contour. There is a large amount of perihepatic ascites that tracks into the right paracolic gutter and pelvis. There is cholelithiasis. No focal liver lesions. The portal vein is patent. Pancreas: Normal pancreatic contours and enhancement. No peripancreatic fluid collection or pancreatic ductal dilatation. Spleen: The  spleen is mildly enlarged. There is a large amount of perisplenic ascites tracking down the left pericolic gutter into the pelvis. Adrenals/Urinary Tract: Normal adrenal glands. Both kidneys are atrophic. There is a dilated vein connecting the right renal vein and superior mesenteric vein. Stomach/Bowel: No abnormal bowel dilatation. No bowel wall thickening or adjacent fat stranding to indicate acute inflammation. No abdominal fluid collection. Vascular/Lymphatic: There is atherosclerotic calcification of the non aneurysmal abdominal aorta. The portal vein, superior mesenteric vein and splenic vein are patent. No abdominal or pelvic adenopathy. Reproductive: Normal uterus and ovaries. Musculoskeletal: No lytic or blastic osseous lesion. Normal visualized extrathoracic and extraperitoneal soft tissues. Other: There is an umbilical hernia that is fluid filled. IMPRESSION: 1. Hepatic cirrhosis and findings of portal hypertension, including large volume ascites throughout the abdomen, extending along both paracolic gutters into the pelvis. 2. Cholelithiasis. 3. Aortic atherosclerosis. Electronically Signed   By: Ulyses Jarred M.D.   On: 10/30/2016 02:05   US Paracentesis  Result Date: 10/30/2016 INDICATION: Cirrhosis, ascites EXAM: ULTRASOUND GUIDED DIAGNOSTIC AND THERAPEUTIC PARACENTESIS MEDICATIONS: None. COMPLICATIONS: None immediate. PROCEDURE: Procedure, benefits, and risks of procedure were discussed with patient. Written informed consent for procedure was obtained. Time out protocol followed. Adequate collection of ascites localized by ultrasound in RIGHT lower quadrant. Skin prepped and draped in usual sterile fashion. Skin and soft tissues anesthetized with 10 mL of 1% lidocaine. 5 [place] Yueh catheter placed into peritoneal cavity. 2.7 L of dark yellow ascitic fluid aspirated by vacuum bottle suction. Procedure tolerated well by patient without immediate complication. FINDINGS: A total of approximately  2.7 L of ascitic fluid was removed. Samples were sent to the laboratory as requested by the clinical team. IMPRESSION: Successful ultrasound-guided paracentesis yielding 2.7 liters of peritoneal fluid. Electronically Signed   By: Lavonia Dana M.D.   On: 10/30/2016 11:11   Dg Chest Port 1 View  Result Date: 10/09/2016 CLINICAL DATA:  Acute respiratory failure EXAM: PORTABLE CHEST 1 VIEW COMPARISON:  10/08/2016 FINDINGS: Endotracheal tube and NG tube removed. Improved aeration of the lung bases. Mild residual left lower lobe atelectasis or infiltrate remains. No effusion IMPRESSION: Improved aeration in the lung bases following extubation. Mild residual left lower lobe airspace disease. Electronically Signed   By: Franchot Gallo M.D.   On: 10/09/2016 07:18   Dg Chest Port 1 View  Result Date: 10/08/2016 CLINICAL DATA:  Respiratory failure.  Shortness of breath . EXAM: PORTABLE CHEST 1 VIEW COMPARISON:  10/07/2016. FINDINGS: Endotracheal tube and NG tube in stable position. Heart size normal. Basilar subsegmental atelectasis. No pleural effusion or pneumothorax. IMPRESSION: 1. Lines and tubes in stable position. 2. Basilar subsegmental atelectasis. Tiny left pleural effusion. Chest is stable from prior exam. Electronically Signed   By: Marcello Moores  Register   On: 10/08/2016 07:01   Dg Chest Port 1 View  Result Date: 10/07/2016 CLINICAL DATA:  Respiratory failure. Check endotracheal tube position. EXAM: PORTABLE CHEST 1 VIEW COMPARISON:  10/07/2016 FINDINGS: The tip of  an endotracheal tube is 5 cm above the carina. Gastric tube extends into the expected location of the stomach. The tip is excluded on this exam. The heart and mediastinal contours are stable with aortic atherosclerosis. Subsegmental atelectasis and/or scarring at the left lung base. Trace left effusion. Mild central pulmonary vascular congestion as before. IMPRESSION: 1. Satisfactory endotracheal and gastric tube positions. 2. Aortic atherosclerosis.  3. Mild central vascular congestion. 4. Left basilar subsegmental atelectasis and/or scarring. 5. Trace left effusion. Electronically Signed   By: Ashley Royalty M.D.   On: 10/07/2016 22:44   Dg Chest Portable 1 View  Result Date: 10/07/2016 CLINICAL DATA:  Dialysis.  Unresponsive . EXAM: PORTABLE CHEST 1 VIEW COMPARISON:  09/25/2016 . FINDINGS: Endotracheal tube noted with tip 3.7 cm above the carina. NG tube noted with tip below left hemidiaphragm. Mild cardiomegaly with mild pulmonary vascular prominence. Left base subsegmental atelectasis. Small left pleural effusion cannot be excluded. No pneumothorax . IMPRESSION: 1.  Endotracheal tube and NG tube in stable position. 2.  Cardiomegaly with mild pulmonary vascular prominence. 3. Mild left base subsegmental atelectasis. Small left pleural effusion cannot be excluded . Electronically Signed   By: Marcello Moores  Register   On: 10/07/2016 16:53    Microbiology: Recent Results (from the past 240 hour(s))  Culture, blood (Routine X 2) w Reflex to ID Panel     Status: None (Preliminary result)   Collection Time: 10/30/16 12:21 AM  Result Value Ref Range Status   Specimen Description BLOOD LEFT HAND  Final   Special Requests NONE 6CC  Final   Culture NO GROWTH < 12 HOURS  Final   Report Status PENDING  Incomplete  Culture, blood (Routine X 2) w Reflex to ID Panel     Status: None (Preliminary result)   Collection Time: 10/30/16 12:33 AM  Result Value Ref Range Status   Specimen Description BLOOD LEFT HAND  Final   Special Requests BOTTLES DRAWN AEROBIC AND ANAEROBIC 6CC EACH  Final   Culture NO GROWTH < 12 HOURS  Final   Report Status PENDING  Incomplete  Gram stain     Status: None   Collection Time: 10/30/16 10:15 AM  Result Value Ref Range Status   Specimen Description ASCITIC  Final   Special Requests NONE  Final   Gram Stain   Final    CYTOSPIN SMEAR NO ORGANISMS SEEN WBC PRESENT,BOTH PMN AND MONONUCLEAR    Report Status 10/30/2016 FINAL   Final  Culture, body fluid-bottle     Status: None (Preliminary result)   Collection Time: 10/30/16 10:15 AM  Result Value Ref Range Status   Specimen Description ASCITIC COLLECTED BY DOCTOR  Final   Special Requests BOTTLES DRAWN AEROBIC AND ANAEROBIC 10CC EACH  Final   Culture PENDING  Incomplete   Report Status PENDING  Incomplete     Labs: Basic Metabolic Panel:  Recent Labs Lab 10/29/16 2149 10/30/16 0609  NA 133* 133*  K 4.1 4.3  CL 96* 94*  CO2 19* 21*  GLUCOSE 91 103*  BUN 99* 103*  CREATININE 13.90* 14.05*  CALCIUM 8.5* 8.5*   Liver Function Tests:  Recent Labs Lab 10/29/16 2149 10/30/16 0609  AST 54* 51*  ALT 27 26  ALKPHOS 109 99  BILITOT 1.0 0.9  PROT 7.1 6.6  ALBUMIN 3.2* 3.0*    Recent Labs Lab 10/29/16 2149  LIPASE 108*    Recent Labs Lab 10/30/16 0021  AMMONIA 32   CBC:  Recent Labs Lab 10/29/16  2149  WBC 6.1  HGB 8.9*  HCT 26.4*  MCV 98.5  PLT 88*   Cardiac Enzymes: No results for input(s): CKTOTAL, CKMB, CKMBINDEX, TROPONINI in the last 168 hours. BNP: BNP (last 3 results)  Recent Labs  05/19/16 1510  BNP 95.0    ProBNP (last 3 results) No results for input(s): PROBNP in the last 8760 hours.  CBG: No results for input(s): GLUCAP in the last 168 hours.     SignedLelon Frohlich  [facility] Pager: [phone] 10/30/2016, 6:06 PM

## 2016-10-30 NOTE — Procedures (Signed)
PreOperative Dx: Cirrhosis, ascites Postoperative Dx: Cirrhosis, ascites Procedure:   US guided paracentesis Radiologist:  Thornton Papas Anesthesia:  10 ml of1% lidocaine Specimen:  2.7 L of dark yellow ascitic fluid EBL:   < 1 ml Complications: None

## 2016-10-30 NOTE — Progress Notes (Signed)
Paracentesis complete no signs of distress. 2.6L dark yellow colored ascites removed.

## 2016-10-31 LAB — HEPATITIS B SURFACE ANTIGEN: Hepatitis B Surface Ag: NEGATIVE

## 2016-11-01 IMAGING — US US PARACENTESIS
1 series · 3 of 3 positions shown · non-contrast
Comparison: none

INDICATION: Ascites.

[Series 1: us paracentesis · 0.27mm/px · 3 of 3 slices shown]
[im 1/3]
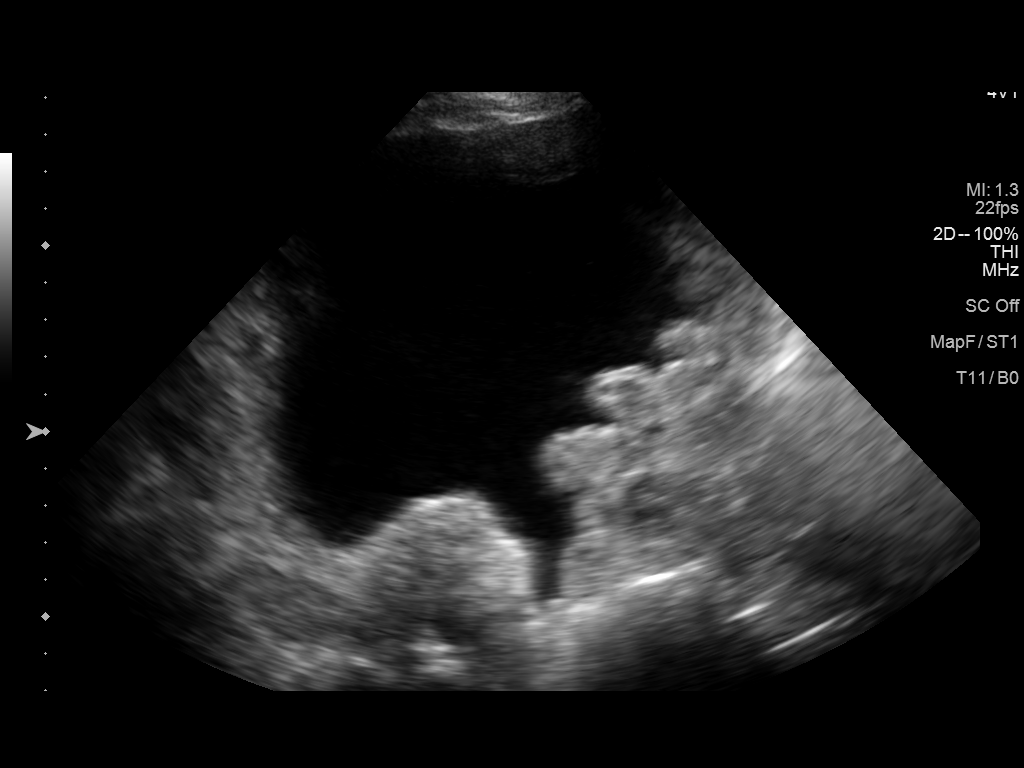
[im 2/3]
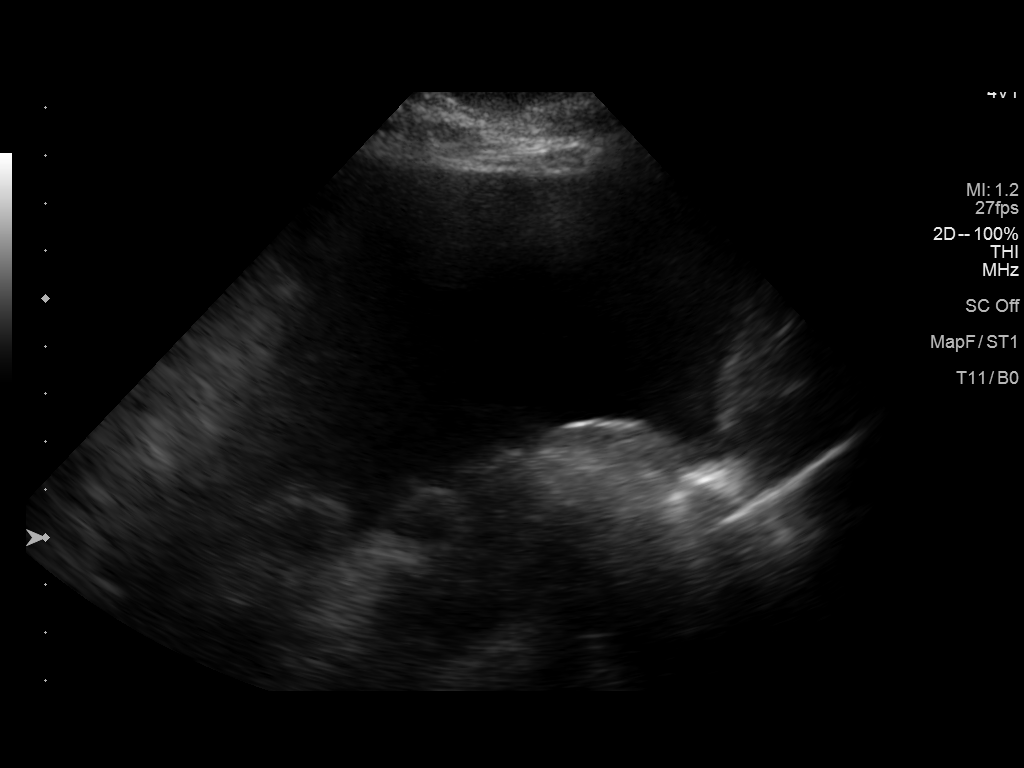
[im 3/3]
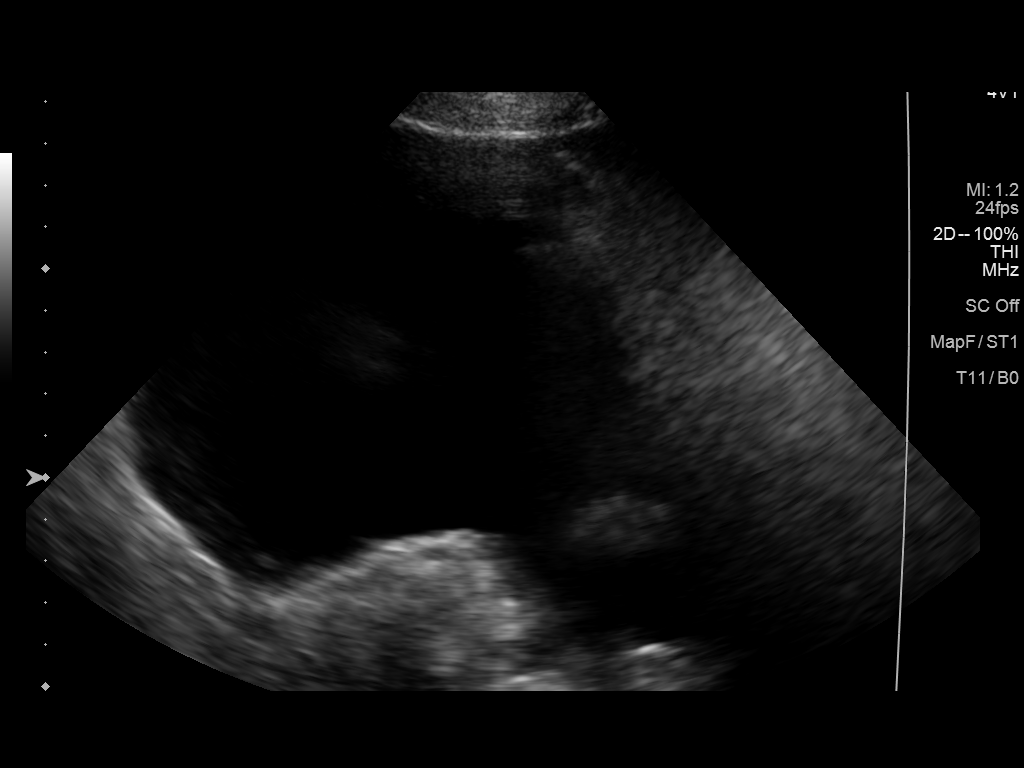

[3 of 3 positions shown; findings below may reference images not displayed]

EXAM:
ULTRASOUND GUIDED PARACENTESIS

MEDICATIONS:
None.

COMPLICATIONS:
None immediate.

PROCEDURE:
Informed written consent was obtained from the patient after a
discussion of the risks, benefits and alternatives to treatment. A
timeout was performed prior to the initiation of the procedure.

Initial ultrasound scanning demonstrates a large amount of ascites
within the right lower abdominal quadrant. The right lower abdomen
was prepped and draped in the usual sterile fashion. 1% lidocaine
was used for local anesthesia.

Following this, a 6 French catheter was introduced. An ultrasound
image was saved for documentation purposes. The paracentesis was
performed. The catheter was removed and a dressing was applied. The
patient tolerated the procedure well without immediate post
procedural complication.
FINDINGS: A total of approximately 4.1 L of serosanguineous fluid was removed.
IMPRESSION: Successful ultrasound-guided paracentesis yielding 4.1 liters of
peritoneal fluid.

## 2016-11-02 LAB — PATHOLOGIST SMEAR REVIEW

## 2016-11-04 LAB — CULTURE, BLOOD (ROUTINE X 2)
CULTURE: NO GROWTH
CULTURE: NO GROWTH

## 2016-11-04 LAB — CULTURE, BODY FLUID W GRAM STAIN -BOTTLE: Culture: NO GROWTH

## 2016-11-04 LAB — CULTURE, BODY FLUID-BOTTLE

## 2016-11-20 ENCOUNTER — Emergency Department (HOSPITAL_COMMUNITY): Payer: Medicare Other

## 2016-11-20 ENCOUNTER — Inpatient Hospital Stay (HOSPITAL_COMMUNITY)
Admission: EM | Admit: 2016-11-20 | Discharge: 2016-11-26 | DRG: 640 | Disposition: A | Discharge status: Home / Self Care | Payer: Medicare Other | Attending: Family Medicine | Admitting: Family Medicine

## 2016-11-20 ENCOUNTER — Encounter (HOSPITAL_COMMUNITY): Payer: Self-pay | Admitting: Emergency Medicine

## 2016-11-20 DIAGNOSIS — Z515 Encounter for palliative care: Secondary | ICD-10-CM | POA: Diagnosis not present

## 2016-11-20 DIAGNOSIS — K7290 Hepatic failure, unspecified without coma: Secondary | ICD-10-CM | POA: Diagnosis present

## 2016-11-20 DIAGNOSIS — Z7189 Other specified counseling: Secondary | ICD-10-CM | POA: Diagnosis not present

## 2016-11-20 DIAGNOSIS — E8779 Other fluid overload: Secondary | ICD-10-CM | POA: Diagnosis present

## 2016-11-20 DIAGNOSIS — K7460 Unspecified cirrhosis of liver: Secondary | ICD-10-CM | POA: Diagnosis present

## 2016-11-20 DIAGNOSIS — Z79899 Other long term (current) drug therapy: Secondary | ICD-10-CM

## 2016-11-20 DIAGNOSIS — R188 Other ascites: Secondary | ICD-10-CM | POA: Diagnosis present

## 2016-11-20 DIAGNOSIS — E876 Hypokalemia: Secondary | ICD-10-CM | POA: Diagnosis present

## 2016-11-20 DIAGNOSIS — E871 Hypo-osmolality and hyponatremia: Secondary | ICD-10-CM | POA: Diagnosis present

## 2016-11-20 DIAGNOSIS — K922 Gastrointestinal hemorrhage, unspecified: Secondary | ICD-10-CM | POA: Diagnosis not present

## 2016-11-20 DIAGNOSIS — D62 Acute posthemorrhagic anemia: Secondary | ICD-10-CM | POA: Diagnosis present

## 2016-11-20 DIAGNOSIS — E875 Hyperkalemia: Secondary | ICD-10-CM | POA: Diagnosis present

## 2016-11-20 DIAGNOSIS — Z91040 Latex allergy status: Secondary | ICD-10-CM

## 2016-11-20 DIAGNOSIS — G894 Chronic pain syndrome: Secondary | ICD-10-CM | POA: Diagnosis present

## 2016-11-20 DIAGNOSIS — E7220 Disorder of urea cycle metabolism, unspecified: Secondary | ICD-10-CM | POA: Diagnosis present

## 2016-11-20 DIAGNOSIS — D649 Anemia, unspecified: Secondary | ICD-10-CM

## 2016-11-20 DIAGNOSIS — K219 Gastro-esophageal reflux disease without esophagitis: Secondary | ICD-10-CM | POA: Diagnosis present

## 2016-11-20 DIAGNOSIS — Z9115 Patient's noncompliance with renal dialysis: Secondary | ICD-10-CM

## 2016-11-20 DIAGNOSIS — F419 Anxiety disorder, unspecified: Secondary | ICD-10-CM | POA: Diagnosis present

## 2016-11-20 DIAGNOSIS — K921 Melena: Secondary | ICD-10-CM | POA: Diagnosis present

## 2016-11-20 DIAGNOSIS — D696 Thrombocytopenia, unspecified: Secondary | ICD-10-CM | POA: Diagnosis present

## 2016-11-20 DIAGNOSIS — J810 Acute pulmonary edema: Secondary | ICD-10-CM | POA: Diagnosis present

## 2016-11-20 DIAGNOSIS — N186 End stage renal disease: Secondary | ICD-10-CM | POA: Diagnosis present

## 2016-11-20 DIAGNOSIS — F319 Bipolar disorder, unspecified: Secondary | ICD-10-CM | POA: Diagnosis present

## 2016-11-20 DIAGNOSIS — Z9119 Patient's noncompliance with other medical treatment and regimen: Secondary | ICD-10-CM

## 2016-11-20 DIAGNOSIS — Z992 Dependence on renal dialysis: Secondary | ICD-10-CM

## 2016-11-20 DIAGNOSIS — R609 Edema, unspecified: Secondary | ICD-10-CM | POA: Diagnosis not present

## 2016-11-20 DIAGNOSIS — K7682 Hepatic encephalopathy: Secondary | ICD-10-CM

## 2016-11-20 DIAGNOSIS — Z8249 Family history of ischemic heart disease and other diseases of the circulatory system: Secondary | ICD-10-CM

## 2016-11-20 DIAGNOSIS — R109 Unspecified abdominal pain: Secondary | ICD-10-CM | POA: Diagnosis present

## 2016-11-20 DIAGNOSIS — R601 Generalized edema: Secondary | ICD-10-CM | POA: Diagnosis not present

## 2016-11-20 DIAGNOSIS — K7581 Nonalcoholic steatohepatitis (NASH): Secondary | ICD-10-CM | POA: Diagnosis present

## 2016-11-20 DIAGNOSIS — J811 Chronic pulmonary edema: Secondary | ICD-10-CM | POA: Diagnosis present

## 2016-11-20 DIAGNOSIS — K766 Portal hypertension: Secondary | ICD-10-CM | POA: Diagnosis present

## 2016-11-20 DIAGNOSIS — Z888 Allergy status to other drugs, medicaments and biological substances status: Secondary | ICD-10-CM

## 2016-11-20 DIAGNOSIS — K429 Umbilical hernia without obstruction or gangrene: Secondary | ICD-10-CM | POA: Diagnosis present

## 2016-11-20 DIAGNOSIS — D631 Anemia in chronic kidney disease: Secondary | ICD-10-CM | POA: Diagnosis present

## 2016-11-20 DIAGNOSIS — Z87891 Personal history of nicotine dependence: Secondary | ICD-10-CM

## 2016-11-20 DIAGNOSIS — K7469 Other cirrhosis of liver: Secondary | ICD-10-CM

## 2016-11-20 DIAGNOSIS — R600 Localized edema: Secondary | ICD-10-CM

## 2016-11-20 DIAGNOSIS — R1084 Generalized abdominal pain: Secondary | ICD-10-CM

## 2016-11-20 DIAGNOSIS — R140 Abdominal distension (gaseous): Secondary | ICD-10-CM

## 2016-11-20 DIAGNOSIS — E872 Acidosis: Secondary | ICD-10-CM | POA: Diagnosis present

## 2016-11-20 DIAGNOSIS — Z91199 Patient's noncompliance with other medical treatment and regimen due to unspecified reason: Secondary | ICD-10-CM

## 2016-11-20 LAB — CBC WITH DIFFERENTIAL/PLATELET
BASOS PCT: 0 %
Basophils Absolute: 0 10*3/uL (ref 0.0–0.1)
EOS ABS: 0 10*3/uL (ref 0.0–0.7)
EOS PCT: 0 %
HCT: 19.8 % — ABNORMAL LOW (ref 36.0–46.0)
Hemoglobin: 6.8 g/dL — CL (ref 12.0–15.0)
Lymphocytes Relative: 4 %
Lymphs Abs: 0.4 10*3/uL — ABNORMAL LOW (ref 0.7–4.0)
MCH: 33.7 pg (ref 26.0–34.0)
MCHC: 34.3 g/dL (ref 30.0–36.0)
MCV: 98 fL (ref 78.0–100.0)
MONO ABS: 0.5 10*3/uL (ref 0.1–1.0)
Monocytes Relative: 6 %
Neutro Abs: 8.6 10*3/uL — ABNORMAL HIGH (ref 1.7–7.7)
Neutrophils Relative %: 90 %
PLATELETS: 138 10*3/uL — AB (ref 150–400)
RBC: 2.02 MIL/uL — ABNORMAL LOW (ref 3.87–5.11)
RDW: 17.9 % — AB (ref 11.5–15.5)
WBC: 9.5 10*3/uL (ref 4.0–10.5)

## 2016-11-20 LAB — BLOOD GAS, VENOUS
ACID-BASE EXCESS: 17.6 mmol/L — AB (ref 0.0–2.0)
BICARBONATE: 10.9 mmol/L — AB (ref 20.0–28.0)
Drawn by: 253561
O2 Saturation: 96.8 %
PCO2 VEN: 22.1 mmHg — AB (ref 44.0–60.0)
PH VEN: 7.219 — AB (ref 7.250–7.430)
pO2, Ven: 114 mmHg — ABNORMAL HIGH (ref 32.0–45.0)

## 2016-11-20 LAB — COMPREHENSIVE METABOLIC PANEL
ALT: 22 U/L (ref 14–54)
AST: 38 U/L (ref 15–41)
Albumin: 2.8 g/dL — ABNORMAL LOW (ref 3.5–5.0)
Alkaline Phosphatase: 112 U/L (ref 38–126)
Anion gap: 27 — ABNORMAL HIGH (ref 5–15)
BILIRUBIN TOTAL: 0.8 mg/dL (ref 0.3–1.2)
BUN: 150 mg/dL — AB (ref 6–20)
CO2: 8 mmol/L — ABNORMAL LOW (ref 22–32)
CREATININE: 21.99 mg/dL — AB (ref 0.44–1.00)
Calcium: 5.6 mg/dL — CL (ref 8.9–10.3)
Chloride: 97 mmol/L — ABNORMAL LOW (ref 101–111)
GFR calc Af Amer: 2 mL/min — ABNORMAL LOW (ref >60)
GFR, EST NON AFRICAN AMERICAN: 2 mL/min — AB (ref >60)
Glucose, Bld: 138 mg/dL — ABNORMAL HIGH (ref 65–99)
POTASSIUM: 5.2 mmol/L — AB (ref 3.5–5.1)
Sodium: 132 mmol/L — ABNORMAL LOW (ref 135–145)
TOTAL PROTEIN: 6.5 g/dL (ref 6.5–8.1)

## 2016-11-20 LAB — APTT: APTT: 36 s (ref 24–36)

## 2016-11-20 LAB — MAGNESIUM: MAGNESIUM: 2.7 mg/dL — AB (ref 1.7–2.4)

## 2016-11-20 LAB — POC OCCULT BLOOD, ED: FECAL OCCULT BLD: POSITIVE — AB

## 2016-11-20 LAB — AMMONIA: AMMONIA: 66 umol/L — AB (ref 9–35)

## 2016-11-20 LAB — PREPARE RBC (CROSSMATCH)

## 2016-11-20 LAB — LIPASE, BLOOD: Lipase: 193 U/L — ABNORMAL HIGH (ref 11–51)

## 2016-11-20 LAB — PHOSPHORUS: PHOSPHORUS: 18.5 mg/dL — AB (ref 2.5–4.6)

## 2016-11-20 LAB — PROTIME-INR
INR: 1.22
PROTHROMBIN TIME: 15.5 s — AB (ref 11.4–15.2)

## 2016-11-20 MED ORDER — SODIUM CHLORIDE 0.9 % IV SOLN
Freq: Once | INTRAVENOUS | Status: AC
  Administered 2016-11-21: via INTRAVENOUS

## 2016-11-20 MED ORDER — PANTOPRAZOLE SODIUM 40 MG IV SOLR
40.0000 mg | Freq: Once | INTRAVENOUS | Status: AC
  Administered 2016-11-20: 40 mg via INTRAVENOUS
  Filled 2016-11-20: qty 40

## 2016-11-20 MED ORDER — SODIUM CHLORIDE 0.9 % IV SOLN
1.0000 g | Freq: Once | INTRAVENOUS | Status: AC
  Administered 2016-11-20: 1 g via INTRAVENOUS
  Filled 2016-11-20: qty 10

## 2016-11-20 MED ORDER — LORAZEPAM 1 MG PO TABS
1.0000 mg | ORAL_TABLET | Freq: Two times a day (BID) | ORAL | Status: DC | PRN
  Administered 2016-11-20 – 2016-11-25 (×5): 1 mg via ORAL
  Filled 2016-11-20 (×5): qty 1

## 2016-11-20 MED ORDER — SUCRALFATE 1 GM/10ML PO SUSP
1.0000 g | Freq: Three times a day (TID) | ORAL | Status: DC
  Administered 2016-11-20 – 2016-11-24 (×15): 1 g via ORAL
  Filled 2016-11-20 (×16): qty 10

## 2016-11-20 MED ORDER — LACTULOSE 10 GM/15ML PO SOLN
30.0000 g | Freq: Three times a day (TID) | ORAL | Status: DC
  Administered 2016-11-21 – 2016-11-23 (×7): 30 g via ORAL
  Administered 2016-11-24: 7.5 g via ORAL
  Filled 2016-11-20 (×9): qty 60

## 2016-11-20 MED ORDER — PANTOPRAZOLE SODIUM 40 MG PO TBEC: 40.0000 mg | DELAYED_RELEASE_TABLET | Freq: Every day | ORAL | Status: DC

## 2016-11-20 MED ORDER — LACTULOSE 10 GM/15ML PO SOLN
20.0000 g | Freq: Once | ORAL | Status: AC
  Administered 2016-11-20: 20 g via ORAL
  Filled 2016-11-20: qty 30

## 2016-11-20 MED ORDER — MIDODRINE HCL 5 MG PO TABS
10.0000 mg | ORAL_TABLET | ORAL | Status: DC
  Administered 2016-11-21 – 2016-11-24 (×2): 10 mg via ORAL
  Filled 2016-11-20 (×5): qty 2

## 2016-11-20 MED ORDER — PANTOPRAZOLE SODIUM 40 MG IV SOLR
40.0000 mg | Freq: Two times a day (BID) | INTRAVENOUS | Status: DC
  Administered 2016-11-20 – 2016-11-23 (×6): 40 mg via INTRAVENOUS
  Filled 2016-11-20 (×8): qty 40

## 2016-11-20 MED ORDER — SODIUM CHLORIDE 0.9 % IV SOLN
Freq: Once | INTRAVENOUS | Status: AC
  Administered 2016-11-20: 22:00:00 via INTRAVENOUS

## 2016-11-20 MED ORDER — ONDANSETRON HCL 4 MG/2ML IJ SOLN
4.0000 mg | Freq: Once | INTRAMUSCULAR | Status: AC
  Administered 2016-11-20: 4 mg via INTRAVENOUS
  Filled 2016-11-20: qty 2

## 2016-11-20 MED ORDER — MORPHINE SULFATE (PF) 4 MG/ML IV SOLN
4.0000 mg | Freq: Once | INTRAVENOUS | Status: AC
  Administered 2016-11-20: 4 mg via INTRAVENOUS
  Filled 2016-11-20: qty 1

## 2016-11-20 MED ORDER — SEVELAMER CARBONATE 800 MG PO TABS
1600.0000 mg | ORAL_TABLET | Freq: Three times a day (TID) | ORAL | Status: DC
Start: 2016-11-21 — End: 2016-11-22
  Administered 2016-11-21 – 2016-11-22 (×3): 1600 mg via ORAL
  Filled 2016-11-20 (×3): qty 2

## 2016-11-20 NOTE — H&P (Addendum)
History and Physical  Wanda [name]:[id number] DOB: 15-Nov-1975 DOA: 11/20/2016  Referring physician: Dr Gilford Raid, ED physician PCP: [name], MD  Outpatient Specialists:   Hinda Lenis (Nephrology)  Fields (GI)  Patient Coming From: home  Chief Complaint: abdominal pain  HPI: Wanda [name] is a 41 y.o. female with a history of ESRD on dialysis, NASH with cirrhosis and ascites, depression, bipolar, depression, chronic pain, h/o esophageal varices with bleeding. This is the fourth hospitalization over the past 6 months. Patient seen due to abdominal pain which is diffuse, which she was hospitalized for back of the end of March. The patient was released after the abdominal pain had resolved the following day after dialysis. She did have a paracentesis done while at the hospital. Today, the patient and missed a pain it's been increasing over the past several weeks. She is not able to provide much other history of her abdominal pain as she is a poor historian. She does admit to missing approximately 3 weeks of dialysis. She states that the last time she had dialysis was when she was hospitalized 3 weeks ago. She states that she misses her dialysis because she doesn't feel well.  Emergency Department Course: Blood work reveals hemoglobin of 6.8. Patient's bicarbonate is low at 8 and her creatinine is 21.99. Her calcium is low at 5.6. Her potassium is 5.2. Fecal occult blood was positive. X-ray shows pulmonary edema  Review of Systems:   Pt denies any fevers, chills, nausea, vomiting, diarrhea, constipation, shortness of breath, dyspnea on exertion, orthopnea, cough, wheezing, palpitations, headache, vision changes, lightheadedness, dizziness, melena, rectal bleeding.  Review of systems are otherwise negative  Past Medical History:  Diagnosis Date  . Acute blood loss anemia 02/25/2014   Status post transfusion  . Acute renal failure ([place]) 09/2013   Pre-renal- resolved  . Anasarca  10/10/2013  . Anxiety   . Bipolar disorder ([place]) 12/04/2013   2007-SEEN IN ED FOR INVOLUNTARY COMMITMENT, UDS POS FOR AMPHETAMINES/OPIATES   . Bleeding esophageal varices ([place]) 02/28/2014   s/p banding  . C. difficile colitis 04/19/2014  . Chronic hypotension   . Cirrhosis ([place]) 10/05/13   Liver bx 11/23/13 (delayed initially due to patient refusal). c/w steatohepatitis  . Cirrhosis of liver with ascites ([place])   . Depression   . ESRD (end stage renal disease) on dialysis ([place]) 08/2014  . Folate deficiency 09/2013  . Gastroesophageal junction ulcer 09/17/2014  . GERD (gastroesophageal reflux disease)   . Hematemesis/vomiting blood 02/24/2014  . Macrocytosis 02/28/2014  . PNA (pneumonia) 10/13/2013  . SBP (spontaneous bacterial peritonitis) ([place]) 11/10/2013  . Thrombocytopenia ([place])    Hypercellular bone marrow; abundant megakaryocytes per 08/27/2014; s/p bone marrow bx   Past Surgical History:  Procedure Laterality Date  . AV FISTULA PLACEMENT Right 11/16/2014   Procedure: Right arm Creation of arteriovenous fistula;  Surgeon: Angelia Mould, MD;  Location: [facility];  Service: Vascular;  Laterality: Right;  . CENTRAL VENOUS CATHETER INSERTION Right   . COLONOSCOPY N/A 12/19/2013   SLF:NO OBVIOUS SOURCE FOR ANEMIA IDETIFIED/ONE COLON POLYP REMOVED/Small internal hemorrhoids  . ESOPHAGEAL BANDING  07/04/2014   Procedure: ESOPHAGEAL BANDING;  Surgeon: Daneil Dolin, MD;  [facility];  Service: Endoscopy;;  . ESOPHAGEAL BANDING N/A 07/24/2014   Procedure: ESOPHAGEAL BANDING (2 bands applied);  Surgeon: Danie Binder, MD;  Location: [facility];  Service: Endoscopy;  Laterality: N/A;  . ESOPHAGEAL BANDING N/A 07/23/2015   Procedure: ESOPHAGEAL BANDING;  Surgeon: Carlyon Prows  Rexene Edison, MD;  [facility];  Service: Endoscopy;  Laterality: N/A;  . ESOPHAGEAL BANDING N/A 03/04/2016   Procedure: ESOPHAGEAL BANDING;  Surgeon: Danie Binder, MD;  [facility];  Service: Endoscopy;   Laterality: N/A;  . ESOPHAGEAL BANDING N/A 04/30/2016   Procedure: ESOPHAGEAL BANDING;  Surgeon: Daneil Dolin, MD;  [facility];  Service: Endoscopy;  Laterality: N/A;  . ESOPHAGOGASTRODUODENOSCOPY N/A 11/14/2013   SLF:1 column of very small varices in distal esopahgus/MODERATE PORTAL GASTROPATHY IN PROXIMAL STOMACH/MODERATE erosive gastritis  . ESOPHAGOGASTRODUODENOSCOPY N/A 02/11/2014   Dr. Rourk:Esophageal varices with bleeding stigmata-status post esophageal band ligation therapy. Portal gastropathy  . ESOPHAGOGASTRODUODENOSCOPY N/A 07/04/2014   RMR: Persiting grade 2 esophageal varicies with bleeding stigmata status post band ligation. Significantly congested gastric mucosa iwith changes constistant with protal gastropathy.   . ESOPHAGOGASTRODUODENOSCOPY N/A 09/16/2014   Rehman: Single short column of varix proximal to GE junction not large enough to be banded. Two amall ulcers at the GEJ felt to be source of GI Bleeding but no active bleeding but no actibe bleeding noted. No therapy rendered. Portal gastropathy NO evidence of peptic ulcer diease or gastric varices.   . ESOPHAGOGASTRODUODENOSCOPY N/A 12/14/2014   SLF: Grade ! esophageal varices. 2. Moderate Portal Gastropathy  . ESOPHAGOGASTRODUODENOSCOPY N/A 03/27/2015   Procedure: ESOPHAGOGASTRODUODENOSCOPY (EGD);  Surgeon: Danie Binder, MD;  [facility];  Service: Endoscopy;  Laterality: N/A;  1045am - moved to 817 @ 11:30  . ESOPHAGOGASTRODUODENOSCOPY N/A 06/05/2015   Procedure: ESOPHAGOGASTRODUODENOSCOPY (EGD);  Surgeon: Clarene Essex, MD;  Location[facility] ENDOSCOPY;  Service: Endoscopy;  Laterality: N/A;  . ESOPHAGOGASTRODUODENOSCOPY N/A 07/23/2015   SLF:1. Grade 1 esophageal varices 2. Moderate portal hypertensive gastropathy 3. MILd non-erosive gastritis.   [name] ESOPHAGOGASTRODUODENOSCOPY N/A 03/04/2016   Dr. Oneida Alar: grade 1 varices, surveillance in Jan 2017   . ESOPHAGOGASTRODUODENOSCOPY N/A 05/21/2016   Procedure:  ESOPHAGOGASTRODUODENOSCOPY (EGD);  Surgeon: Gatha Mayer, MD;  Location: [facility] ENDOSCOPY;  Service: Endoscopy;  Laterality: N/A;  . ESOPHAGOGASTRODUODENOSCOPY (EGD) WITH PROPOFOL N/A 07/24/2014   SLF:  1. 2 columns grade 2-3 varices- 2 Bands applied.  2.  Moderate gastropathy 3. Duodenal Diverticula  . ESOPHAGOGASTRODUODENOSCOPY (EGD) WITH PROPOFOL N/A 10/26/2014   RMR: 2 columns of grade 2 esophageal varices without obvious bleeding stigmata status post band ligation to complet obliteration of remaining varices.   . ESOPHAGOGASTRODUODENOSCOPY (EGD) WITH PROPOFOL N/A 04/30/2016   Procedure: ESOPHAGOGASTRODUODENOSCOPY (EGD) WITH PROPOFOL;  Surgeon: Daneil Dolin, MD;  [facility];  Service: Endoscopy;  Laterality: N/A;  . None    . PARACENTESIS  Feb 2015   1180 fluid, negative fluid analysis.   [name] PARACENTESIS  10/2013   Social History:  reports that she quit smoking about 8 years ago. Her smoking use included Cigarettes. She has a 5.00 pack-year smoking history. She has never used smokeless tobacco. She reports that she does not drink alcohol or use drugs. Patient lives at [place]  . Lasix [Furosemide] Other (See Comments)    "doesn't work"  . Latex Itching    Family History  Problem Relation Age of Onset  . Heart disease Mother   . Colon cancer Neg Hx   . Liver disease Neg Hx       Prior to Admission medications   Medication Sig Start Date End Date Taking? Authorizing Provider  clotrimazole (LOTRIMIN) 1 % cream Apply 1 application topically 3 (three) times daily. 09/30/16  Yes Danie Binder, MD  Darbepoetin Alfa (ARANESP) 60 MCG/0.3ML SOSY injection Inject 0.3 mLs (60 mcg total) into the vein every Thursday with hemodialysis. Patient taking differently: Inject 60 mcg into the vein every Tuesday with hemodialysis.  06/09/15  Yes Cherene Altes, MD  lactulose (CHRONULAC) 10 GM/15ML solution Take 45 mLs (30 g total) by mouth 3 (three) times  daily. TAKE 15-30 ml BY MOUTH three times a day Patient taking differently: Take 10-20 g by mouth 3 (three) times daily. TAKE 15-30 ml BY MOUTH three times a day 09/27/16  Yes Cherene Altes, MD  lidocaine (XYLOCAINE) 2 % solution TAKE TWO TEASPOONSFUL (10ML) BY MOUTH BEFORE MEALS AND AT BEDTIME TO PREVENT CHEST PAIN WHILE EATING 03/19/16  Yes Carlis Stable, NP  lidocaine-prilocaine (EMLA) cream Apply 1 application topically as needed (for dialysis treatments).   Yes Historical Provider, MD  LORazepam (ATIVAN) 1 MG tablet Take 1 mg by mouth 2 (two) times daily as needed for anxiety (and/or back spasms).   Yes Historical Provider, MD  midodrine (PROAMATINE) 10 MG tablet Take 1 tablet (10 mg total) by mouth every morning. Patient taking differently: Take 10 mg by mouth See admin instructions. Take ONLY on dialysis days 09/28/16  Yes Cherene Altes, MD  omeprazole (PRILOSEC) 20 MG capsule 1 po every morning 30 minutes prior to your first meal. Patient taking differently: Take 20 mg by mouth daily before breakfast. 1 po every morning 30 minutes prior to your first meal. 09/30/16  Yes Danie Binder, MD  Oxycodone HCl 10 MG TABS Take 0.5 tablets (5 mg total) by mouth 3 (three) times daily as needed. 1 PO TID AS NEEDED FOR PAIN Patient taking differently: Take 5-10 mg by mouth 3 (three) times daily as needed.  09/27/16  Yes Cherene Altes, MD  RENVELA 800 MG tablet Take 1600 mg by mouth 3 times daily with meals. Take 800 mg by mouth with snacks. 10/29/15  Yes Historical Provider, MD  sucralfate (CARAFATE) 1 GM/10ML suspension Take 1 g by mouth 4 (four) times daily -  with meals and at bedtime.   Yes Historical Provider, MD    Physical Exam: BP (!) 115/56   Pulse 81   Temp 97.5 F (36.4 C) (Oral)   Resp 15   Ht 5\' 1"  (1.549 m)   Wt 66.2 kg (146 lb)   LMP 09/21/2013   SpO2 97%   BMI 27.59 kg/m   General: Middle-aged Caucasian female who appears older than stated age. Awake and alert and oriented  x3. No acute cardiopulmonary distress.  HEENT: Normocephalic atraumatic.  Right and left ears normal in appearance.  Pupils equal, round, reactive to light. Extraocular muscles are intact. Sclerae anicteric and noninjected.  Moist mucosal membranes. No mucosal lesions.  Neck: Neck supple without lymphadenopathy. No carotid bruits. No masses palpated.  Cardiovascular: Regular rate with normal S1-S2 sounds. No murmurs, rubs, gallops auscultated. JVD difficult to assess due to body habitus. There is diffuse lower extremity edema all the way up to mid abdomen. Respiratory: Rales in bases to mid lung fields bilaterally. No accessory muscle use. Abdomen: Soft, nontender, nondistended. Active bowel sounds. No masses or hepatosplenomegaly  Skin: No rashes, lesions, or ulcerations.  Dry, warm to touch. 2+ dorsalis pedis and radial pulses. Musculoskeletal: No calf or leg pain. All major joints not erythematous nontender.  No upper or lower joint deformation.  Good ROM.  No contractures  Psychiatric: Intact judgment and insight. Pleasant and cooperative.  Neurologic: No focal neurological deficits. Strength is 5/5 and symmetric in upper and lower extremities.  Cranial nerves II through XII are grossly intact.           Labs on Admission: I have personally reviewed following labs and imaging studies  CBC:  Recent Labs Lab 11/20/16 1519  WBC 9.5  NEUTROABS 8.6*  HGB 6.8*  HCT 19.8*  MCV 98.0  PLT 983*   Basic Metabolic Panel:  Recent Labs Lab 11/20/16 1519  NA 132*  K 5.2*  CL 97*  CO2 8*  GLUCOSE 138*  BUN 150*  CREATININE 21.99*  CALCIUM 5.6*   GFR: Estimated Creatinine Clearance: 2.9 mL/min (A) (by C-G formula based on SCr of 21.99 mg/dL (H)). Liver Function Tests:  Recent Labs Lab 11/20/16 1519  AST 38  ALT 22  ALKPHOS 112  BILITOT 0.8  PROT 6.5  ALBUMIN 2.8*    Recent Labs Lab 11/20/16 1519  LIPASE 193*    Recent Labs Lab 11/20/16 1519  AMMONIA 66*    Coagulation Profile:  Recent Labs Lab 11/20/16 1652  INR 1.22   Cardiac Enzymes: No results for input(s): CKTOTAL, CKMB, CKMBINDEX, TROPONINI in the last 168 hours. BNP (last 3 results) No results for input(s): PROBNP in the last 8760 hours. HbA1C: No results for input(s): HGBA1C in the last 72 hours. CBG: No results for input(s): GLUCAP in the last 168 hours. Lipid Profile: No results for input(s): CHOL, HDL, LDLCALC, TRIG, CHOLHDL, LDLDIRECT in the last 72 hours. Thyroid Function Tests: No results for input(s): TSH, T4TOTAL, FREET4, T3FREE, THYROIDAB in the last 72 hours. Anemia Panel: No results for input(s): VITAMINB12, FOLATE, FERRITIN, TIBC, IRON, RETICCTPCT in the last 72 hours. Urine analysis:    Component Value Date/Time   COLORURINE RED (A) 09/23/2016 0805   APPEARANCEUR CLOUDY (A) 09/23/2016 0805   LABSPEC 1.015 09/23/2016 0805   PHURINE 6.5 09/23/2016 0805   GLUCOSEU NEGATIVE 09/23/2016 0805   HGBUR LARGE (A) 09/23/2016 0805   BILIRUBINUR MODERATE (A) 09/23/2016 0805   KETONESUR 15 (A) 09/23/2016 0805   PROTEINUR 100 (A) 09/23/2016 0805   UROBILINOGEN 0.2 08/13/2014 2138   NITRITE POSITIVE (A) 09/23/2016 0805   LEUKOCYTESUR LARGE (A) 09/23/2016 0805   Sepsis Labs: @LABRCNTIP (procalcitonin:4,lacticidven:4) )No results found for this or any previous visit (from the past 240 hour(s)).   Radiological Exams on Admission: Dg Abd Acute W/chest  Result Date: 11/20/2016 CLINICAL DATA:  Abdominal pain. History of end-stage renal disease on dialysis, pneumonia, cirrhosis. EXAM: DG ABDOMEN ACUTE W/ 1V CHEST COMPARISON:  Chest radiograph October 09, 2016 FINDINGS: Increased diffuse interstitial and alveolar airspace opacities. Cardiac silhouette is mildly enlarged, unchanged. Calcified aortic knob. No pleural effusion. No pneumothorax. Hazy appearance of the abdomen suggests ascites. No intra-abdominal mass effect or pathologic calcification on this habitus limited  examination. Bowel gas pattern is nondilated and nonobstructive. Soft tissue planes and included osseous structures are nonsuspicious. IMPRESSION: Interstitial and alveolar airspace opacities concerning for multifocal pneumonia, possible underlying pulmonary edema. Mild cardiomegaly. Probable ascites.  Nonspecific bowel gas pattern. Electronically Signed   By: Elon Alas M.D.   On: 11/20/2016 16:19     Assessment/Plan: Principal Problem:   Abdominal pain Active Problems:   Liver cirrhosis secondary to nonalcoholic steatohepatitis (NASH) ([place])   ESRD on dialysis ([place])   Ascites of liver   Acute blood loss anemia   Pulmonary edema   Hypocalcemia    This patient was discussed with the ED physician, including pertinent vitals, physical exam  findings, labs, and imaging.  We also discussed care given by the ED provider.  #1 abdominal pain  Possibly secondary to ascites  Admit to stepdown due to electrolyte abnormalities and pulmonary edema #2 end-stage renal disease on dialysis  I did discuss the patient with the nephrologist on-call at [facility], who thought that the patient did not require acute hemodialysis  Consult nephrology for dialysis tomorrow  I do believe discussion with the patient does need to occur regarding compliance with dialysis and whether dialysis should continue #3 pulmonary edema  Compensated at the moment  Will dialyze tomorrow #4 acute blood loss anemia  Transfuse with dialysis  We'll type and crossmatch 2 units  This is likely acute on chronic #5 ascites of liver  May need paracentesis #6 liver cirrhosis secondary to [facility] #7 hypocalcemia  Likely secondary to missing dialysis and low bicarbonate level  We'll get ionized calcium, magnesium levels.  If these are low, then start calcium infusion #8 hyperammonemia  Restart lactulose #9 GI bleed  Consult GI  PPI  DVT prophylaxis: SCD Consultants: GI, Nephrology Code Status:  Full Family Communication: husband  Disposition Plan: admit   Truett Mainland, DO [facility] Pager [phone]  If 7PM-7AM, please contact night-coverage [email or web address] Password [email or web address]

## 2016-11-20 NOTE — ED Notes (Signed)
CRITICAL VALUE ALERT  Critical value received:  Hemaglobin = 6.8  Date of notification:  11/20/16  Time of notification:  1792  Critical value read back:Yes.    Nurse who received alert:  Rosealee Albee, RN  MD notified (1st page):  Gilford Raid  Time of first page:  1600  MD notified (2nd page):  Time of second page:  Responding MD:  Gilford Raid  Time MD responded:  1600

## 2016-11-20 NOTE — ED Notes (Signed)
CRITICAL VALUE ALERT  Critical value received:  Calcium = 5.6  Date of notification:  11/20/16  Time of notification:  6728  Critical value read back:Yes.    Nurse who received alert:  Rosealee Albee, RN  MD notified (1st page):  Gilford Raid  Time of first page:  1733  MD notified (2nd page):  Time of second page:  Responding MD:  Gilford Raid  Time MD responded:  [phone]

## 2016-11-20 NOTE — ED Notes (Signed)
Attempted to call report, floor unable to take at this time 

## 2016-11-20 NOTE — ED Triage Notes (Signed)
Patient from home via EMS. Patient complains of abdominal pain and pain. Patient states she had dialysis Monday. States history of paracentesis.

## 2016-11-20 NOTE — ED Provider Notes (Signed)
[facility] DEPT Provider Note   CSN: [id number] Arrival date & time: 11/20/16  1501     History   Chief Complaint Chief Complaint  Patient presents with  . Abdominal Pain  . Leg Swelling    HPI Wanda [name] is a 41 y.o. female.  Pt presents to the ED today with abdominal pain.  The pt is a poor historian and is unable to describe how long she's had sx.  She is a dialysis patient and has not had dialysis since Monday the 9th.  She skipped the 11th and today's dialysis.  She can't tell me why.  She also has a hx of cirrhosis with ascites.  The pt was admitted from 3/22 to 3/23 for similar sx.  The pt denies n/v or diarrhea/constipation.  She does c/o leg swelling.      Past Medical History:  Diagnosis Date  . Acute blood loss anemia 02/25/2014   Status post transfusion  . Acute renal failure ([place]) 09/2013   Pre-renal- resolved  . Anasarca 10/10/2013  . Anxiety   . Bipolar disorder ([place]) 12/04/2013   2007-SEEN IN ED FOR INVOLUNTARY COMMITMENT, UDS POS FOR AMPHETAMINES/OPIATES   . Bleeding esophageal varices ([place]) 02/28/2014   s/p banding  . C. difficile colitis 04/19/2014  . Chronic hypotension   . Cirrhosis ([place]) 10/05/13   Liver bx 11/23/13 (delayed initially due to patient refusal). c/w steatohepatitis  . Cirrhosis of liver with ascites ([place])   . Depression   . ESRD (end stage renal disease) on dialysis ([place]) 08/2014  . Folate deficiency 09/2013  . Gastroesophageal junction ulcer 09/17/2014  . GERD (gastroesophageal reflux disease)   . Hematemesis/vomiting blood 02/24/2014  . Macrocytosis 02/28/2014  . PNA (pneumonia) 10/13/2013  . SBP (spontaneous bacterial peritonitis) ([place]) 11/10/2013  . Thrombocytopenia ([place])    Hypercellular bone marrow; abundant megakaryocytes per 08/27/2014; s/p bone marrow bx    Patient Active Problem List   Diagnosis Date Noted  . Pulmonary edema 11/20/2016  . Hypocalcemia 11/20/2016  . Cardiomyopathy- ? ischemic  10/09/2016  . Troponin level  elevated 10/09/2016  . Palliative care by specialist   . Seizure ([employer]) 10/07/2016  . Pressure injury of skin 05/20/2016  . Shock ([place]) 05/19/2016  . Rhonchi   . Chronic pain syndrome 08/22/2015  . Abdominal pain 08/07/2015  . Umbilical hernia without obstruction and without gangrene 08/07/2015  . Acute blood loss anemia   . Hypotension 06/05/2015  . Edema of left lower extremity 02/22/2015  . Ascites of liver   . History of esophageal varices with bleeding 09/16/2014  . ESRD on dialysis ([place])   . Anemia of chronic disease   . Thrombocytopenia ([place]) 08/19/2014  . Hyponatremia 08/17/2014  . Esophageal varices without bleeding ([place])   . Portal hypertensive gastropathy   . Malnutrition of moderate degree ([place]) 05/15/2014  . Bipolar disorder ([place]) 12/04/2013  . SBP (spontaneous bacterial peritonitis) ([place]) 11/10/2013  . Liver cirrhosis secondary to nonalcoholic steatohepatitis (NASH) ([place]) 11/07/2013  . Folate deficiency 10/13/2013    Past Surgical History:  Procedure Laterality Date  . AV FISTULA PLACEMENT Right 11/16/2014   Procedure: Right arm Creation of arteriovenous fistula;  Surgeon: Angelia Mould, MD;  Location: [facility];  Service: Vascular;  Laterality: Right;  . CENTRAL VENOUS CATHETER INSERTION Right   . COLONOSCOPY N/A 12/19/2013   SLF:NO OBVIOUS SOURCE FOR ANEMIA IDETIFIED/ONE COLON POLYP REMOVED/Small internal hemorrhoids  . ESOPHAGEAL BANDING  07/04/2014   Procedure: ESOPHAGEAL BANDING;  Surgeon: Cristopher Estimable  Rourk, MD;  [facility];  Service: Endoscopy;;  . ESOPHAGEAL BANDING N/A 07/24/2014   Procedure: ESOPHAGEAL BANDING (2 bands applied);  Surgeon: Danie Binder, MD;  Location: [facility];  Service: Endoscopy;  Laterality: N/A;  . ESOPHAGEAL BANDING N/A 07/23/2015   Procedure: ESOPHAGEAL BANDING;  Surgeon: Danie Binder, MD;  [facility];  Service: Endoscopy;  Laterality: N/A;  . ESOPHAGEAL BANDING N/A 03/04/2016   Procedure: ESOPHAGEAL  BANDING;  Surgeon: Danie Binder, MD;  [facility];  Service: Endoscopy;  Laterality: N/A;  . ESOPHAGEAL BANDING N/A 04/30/2016   Procedure: ESOPHAGEAL BANDING;  Surgeon: Daneil Dolin, MD;  [facility];  Service: Endoscopy;  Laterality: N/A;  . ESOPHAGOGASTRODUODENOSCOPY N/A 11/14/2013   SLF:1 column of very small varices in distal esopahgus/MODERATE PORTAL GASTROPATHY IN PROXIMAL STOMACH/MODERATE erosive gastritis  . ESOPHAGOGASTRODUODENOSCOPY N/A 02/11/2014   Dr. Rourk:Esophageal varices with bleeding stigmata-status post esophageal band ligation therapy. Portal gastropathy  . ESOPHAGOGASTRODUODENOSCOPY N/A 07/04/2014   RMR: Persiting grade 2 esophageal varicies with bleeding stigmata status post band ligation. Significantly congested gastric mucosa iwith changes constistant with protal gastropathy.   . ESOPHAGOGASTRODUODENOSCOPY N/A 09/16/2014   Rehman: Single short column of varix proximal to GE junction not large enough to be banded. Two amall ulcers at the GEJ felt to be source of GI Bleeding but no active bleeding but no actibe bleeding noted. No therapy rendered. Portal gastropathy NO evidence of peptic ulcer diease or gastric varices.   . ESOPHAGOGASTRODUODENOSCOPY N/A 12/14/2014   SLF: Grade ! esophageal varices. 2. Moderate Portal Gastropathy  . ESOPHAGOGASTRODUODENOSCOPY N/A 03/27/2015   Procedure: ESOPHAGOGASTRODUODENOSCOPY (EGD);  Surgeon: Danie Binder, MD;  [facility];  Service: Endoscopy;  Laterality: N/A;  1045am - moved to 817 @ 11:30  . ESOPHAGOGASTRODUODENOSCOPY N/A 06/05/2015   Procedure: ESOPHAGOGASTRODUODENOSCOPY (EGD);  Surgeon: Clarene Essex, MD;  Location: [facility] ENDOSCOPY;  Service: Endoscopy;  Laterality: N/A;  . ESOPHAGOGASTRODUODENOSCOPY N/A 07/23/2015   SLF:1. Grade 1 esophageal varices 2. Moderate portal hypertensive gastropathy 3. MILd non-erosive gastritis.   [name] ESOPHAGOGASTRODUODENOSCOPY N/A 03/04/2016   Dr. Oneida Alar: grade 1 varices,  surveillance in Jan 2017   . ESOPHAGOGASTRODUODENOSCOPY N/A 05/21/2016   Procedure: ESOPHAGOGASTRODUODENOSCOPY (EGD);  Surgeon: Gatha Mayer, MD;  Location: [facility] ENDOSCOPY;  Service: Endoscopy;  Laterality: N/A;  . ESOPHAGOGASTRODUODENOSCOPY (EGD) WITH PROPOFOL N/A 07/24/2014   SLF:  1. 2 columns grade 2-3 varices- 2 Bands applied.  2.  Moderate gastropathy 3. Duodenal Diverticula  . ESOPHAGOGASTRODUODENOSCOPY (EGD) WITH PROPOFOL N/A 10/26/2014   RMR: 2 columns of grade 2 esophageal varices without obvious bleeding stigmata status post band ligation to complet obliteration of remaining varices.   . ESOPHAGOGASTRODUODENOSCOPY (EGD) WITH PROPOFOL N/A 04/30/2016   Procedure: ESOPHAGOGASTRODUODENOSCOPY (EGD) WITH PROPOFOL;  Surgeon: Daneil Dolin, MD;  [facility];  Service: Endoscopy;  Laterality: N/A;  . None    . PARACENTESIS  Feb 2015   1180 fluid, negative fluid analysis.   [name] PARACENTESIS  10/2013    OB History    Gravida Para Term Preterm AB Living   1 1 1     1    SAB TAB Ectopic Multiple Live Births                   Home Medications    Prior to Admission medications   Medication Sig Start Date End Date Taking? Authorizing Provider  clotrimazole (LOTRIMIN) 1 % cream Apply 1 application topically 3 (  three) times daily. 09/30/16  Yes Danie Binder, MD  Darbepoetin Alfa (ARANESP) 60 MCG/0.3ML SOSY injection Inject 0.3 mLs (60 mcg total) into the vein every Thursday with hemodialysis. Patient taking differently: Inject 60 mcg into the vein every Tuesday with hemodialysis.  06/09/15  Yes Cherene Altes, MD  lactulose (CHRONULAC) 10 GM/15ML solution Take 45 mLs (30 g total) by mouth 3 (three) times daily. TAKE 15-30 ml BY MOUTH three times a day Patient taking differently: Take 10-20 g by mouth 3 (three) times daily. TAKE 15-30 ml BY MOUTH three times a day 09/27/16  Yes Cherene Altes, MD  lidocaine (XYLOCAINE) 2 % solution TAKE TWO TEASPOONSFUL (10ML) BY MOUTH BEFORE  MEALS AND AT BEDTIME TO PREVENT CHEST PAIN WHILE EATING 03/19/16  Yes Carlis Stable, NP  lidocaine-prilocaine (EMLA) cream Apply 1 application topically as needed (for dialysis treatments).   Yes Historical Provider, MD  LORazepam (ATIVAN) 1 MG tablet Take 1 mg by mouth 2 (two) times daily as needed for anxiety (and/or back spasms).   Yes Historical Provider, MD  midodrine (PROAMATINE) 10 MG tablet Take 1 tablet (10 mg total) by mouth every morning. Patient taking differently: Take 10 mg by mouth See admin instructions. Take ONLY on dialysis days 09/28/16  Yes Cherene Altes, MD  omeprazole (PRILOSEC) 20 MG capsule 1 po every morning 30 minutes prior to your first meal. Patient taking differently: Take 20 mg by mouth daily before breakfast. 1 po every morning 30 minutes prior to your first meal. 09/30/16  Yes Danie Binder, MD  Oxycodone HCl 10 MG TABS Take 0.5 tablets (5 mg total) by mouth 3 (three) times daily as needed. 1 PO TID AS NEEDED FOR PAIN Patient taking differently: Take 5-10 mg by mouth 3 (three) times daily as needed.  09/27/16  Yes Cherene Altes, MD  RENVELA 800 MG tablet Take 1600 mg by mouth 3 times daily with meals. Take 800 mg by mouth with snacks. 10/29/15  Yes Historical Provider, MD  sucralfate (CARAFATE) 1 GM/10ML suspension Take 1 g by mouth 4 (four) times daily -  with meals and at bedtime.   Yes Historical Provider, MD    Family History Family History  Problem Relation Age of Onset  . Heart disease Mother   . Colon cancer Neg Hx   . Liver disease Neg Hx     Social History Social History  Substance Use Topics  . Smoking status: Former Smoker    Packs/day: 0.25    Years: 20.00    Types: Cigarettes    Quit date: 11/05/2008  . Smokeless tobacco: Never Used     Comment: Never really smoked much  . Alcohol use No     Allergies   Lasix [furosemide] and Latex   Review of Systems Review of Systems  Gastrointestinal: Positive for abdominal pain.    Neurological: Positive for weakness.  All other systems reviewed and are negative.    Physical Exam Updated Vital Signs BP (!) 115/56   Pulse 81   Temp 97.5 F (36.4 C) (Oral)   Resp 15   Ht 5\' 1"  (1.549 m)   Wt 146 lb (66.2 kg)   LMP 09/21/2013   SpO2 97%   BMI 27.59 kg/m   Physical Exam  Constitutional: She appears well-developed and well-nourished.  HENT:  Head: Normocephalic and atraumatic.  Right Ear: External ear normal.  Left Ear: External ear normal.  Nose: Nose normal.  Mouth/Throat: Oropharynx is clear and  moist.  Eyes: Conjunctivae and EOM are normal. Pupils are equal, round, and reactive to light.  Neck: Normal range of motion. Neck supple.  Cardiovascular: Normal rate, regular rhythm, normal heart sounds and intact distal pulses.   Pulmonary/Chest: Effort normal and breath sounds normal.  Abdominal: She exhibits distension, fluid wave and ascites. There is generalized tenderness.  Umbilical hernia, easily reducible  Genitourinary: Rectal exam shows guaiac positive stool.  Musculoskeletal: She exhibits edema.  Neurological: She is alert.  Skin: Skin is warm and dry.  Psychiatric: She is slowed. She is inattentive.  Nursing note and vitals reviewed.    ED Treatments / Results  Labs (all labs ordered are listed, but only abnormal results are displayed) Labs Reviewed  COMPREHENSIVE METABOLIC PANEL - Abnormal; Notable for the following:       Result Value   Sodium 132 (*)    Potassium 5.2 (*)    Chloride 97 (*)    CO2 8 (*)    Glucose, Bld 138 (*)    BUN 150 (*)    Creatinine, Ser 21.99 (*)    Calcium 5.6 (*)    Albumin 2.8 (*)    GFR calc non Af Amer 2 (*)    GFR calc Af Amer 2 (*)    Anion gap 27 (*)    All other components within normal limits  LIPASE, BLOOD - Abnormal; Notable for the following:    Lipase 193 (*)    All other components within normal limits  CBC WITH DIFFERENTIAL/PLATELET - Abnormal; Notable for the following:    RBC 2.02  (*)    Hemoglobin 6.8 (*)    HCT 19.8 (*)    RDW 17.9 (*)    Platelets 138 (*)    Neutro Abs 8.6 (*)    Lymphs Abs 0.4 (*)    All other components within normal limits  AMMONIA - Abnormal; Notable for the following:    Ammonia 66 (*)    All other components within normal limits  PROTIME-INR - Abnormal; Notable for the following:    Prothrombin Time 15.5 (*)    All other components within normal limits  POC OCCULT BLOOD, ED - Abnormal; Notable for the following:    Fecal Occult Bld POSITIVE (*)    All other components within normal limits  APTT  URINALYSIS, ROUTINE W REFLEX MICROSCOPIC  BLOOD GAS, VENOUS  TYPE AND SCREEN    EKG  EKG Interpretation None       Radiology Dg Abd Acute W/chest  Result Date: 11/20/2016 CLINICAL DATA:  Abdominal pain. History of end-stage renal disease on dialysis, pneumonia, cirrhosis. EXAM: DG ABDOMEN ACUTE W/ 1V CHEST COMPARISON:  Chest radiograph October 09, 2016 FINDINGS: Increased diffuse interstitial and alveolar airspace opacities. Cardiac silhouette is mildly enlarged, unchanged. Calcified aortic knob. No pleural effusion. No pneumothorax. Hazy appearance of the abdomen suggests ascites. No intra-abdominal mass effect or pathologic calcification on this habitus limited examination. Bowel gas pattern is nondilated and nonobstructive. Soft tissue planes and included osseous structures are nonsuspicious. IMPRESSION: Interstitial and alveolar airspace opacities concerning for multifocal pneumonia, possible underlying pulmonary edema. Mild cardiomegaly. Probable ascites.  Nonspecific bowel gas pattern. Electronically Signed   By: Elon Alas M.D.   On: 11/20/2016 16:19    Procedures Procedures (including critical care time)  Medications Ordered in ED Medications  pantoprazole (PROTONIX) injection 40 mg (not administered)  morphine 4 MG/ML injection 4 mg (4 mg Intravenous Given 11/20/16 1535)  ondansetron (ZOFRAN) injection 4 mg (4  mg  Intravenous Given 11/20/16 1535)  lactulose (CHRONULAC) 10 GM/15ML solution 20 g (20 g Oral Given 11/20/16 1751)  calcium gluconate 1 g in sodium chloride 0.9 % 100 mL IVPB (0 g Intravenous Stopped 11/20/16 1813)  pantoprazole (PROTONIX) injection 40 mg (40 mg Intravenous Given 11/20/16 1752)     Initial Impression / Assessment and Plan / ED Course  I have reviewed the triage vital signs and the nursing notes.  Pertinent labs & imaging results that were available during my care of the patient were reviewed by me and considered in my medical decision making (see chart for details).     CRITICAL CARE Performed by: Isla Pence   Total critical care time: 30 minutes  Critical care time was exclusive of separately billable procedures and treating other patients.  Critical care was necessary to treat or prevent imminent or life-threatening deterioration.  Critical care was time spent personally by me on the following activities: development of treatment plan with patient and/or surrogate as well as nursing, discussions with consultants, evaluation of patient's response to treatment, examination of patient, obtaining history from patient or surrogate, ordering and performing treatments and interventions, ordering and review of laboratory studies, ordering and review of radiographic studies, pulse oximetry and re-evaluation of patient's condition.  Pt d/w Dr. Nehemiah Settle (triad) for admission.    Final Clinical Impressions(s) / ED Diagnoses   Final diagnoses:  Generalized abdominal pain  Liver cirrhosis secondary to NASH (nonalcoholic steatohepatitis) (HCC)  Other ascites  End stage renal disease on dialysis ([place])  History of noncompliance with medical treatment  Anemia, unspecified type  Hypocalcemia  Gastrointestinal hemorrhage, unspecified gastrointestinal hemorrhage type  Hepatic encephalopathy ([place])  Peripheral edema  Acute pulmonary edema (HCC)    New Prescriptions New  Prescriptions   No medications on file     Isla Pence, MD 11/20/16 1913

## 2016-11-21 ENCOUNTER — Inpatient Hospital Stay (HOSPITAL_COMMUNITY): Payer: Medicare Other

## 2016-11-21 LAB — AMMONIA: AMMONIA: 69 umol/L — AB (ref 9–35)

## 2016-11-21 LAB — BASIC METABOLIC PANEL
ANION GAP: 25 — AB (ref 5–15)
BUN: 191 mg/dL — AB (ref 6–20)
CO2: 8 mmol/L — ABNORMAL LOW (ref 22–32)
Calcium: 5.6 mg/dL — CL (ref 8.9–10.3)
Chloride: 96 mmol/L — ABNORMAL LOW (ref 101–111)
Creatinine, Ser: 21.9 mg/dL — ABNORMAL HIGH (ref 0.44–1.00)
GFR calc Af Amer: 2 mL/min — ABNORMAL LOW (ref >60)
GFR, EST NON AFRICAN AMERICAN: 2 mL/min — AB (ref >60)
Glucose, Bld: 111 mg/dL — ABNORMAL HIGH (ref 65–99)
POTASSIUM: 5.3 mmol/L — AB (ref 3.5–5.1)
SODIUM: 129 mmol/L — AB (ref 135–145)

## 2016-11-21 LAB — MRSA PCR SCREENING: MRSA by PCR: NEGATIVE

## 2016-11-21 LAB — CBC
HEMATOCRIT: 25.2 % — AB (ref 36.0–46.0)
Hemoglobin: 8.7 g/dL — ABNORMAL LOW (ref 12.0–15.0)
MCH: 32.1 pg (ref 26.0–34.0)
MCHC: 34.5 g/dL (ref 30.0–36.0)
MCV: 93 fL (ref 78.0–100.0)
Platelets: 110 10*3/uL — ABNORMAL LOW (ref 150–400)
RBC: 2.71 MIL/uL — ABNORMAL LOW (ref 3.87–5.11)
RDW: 19 % — AB (ref 11.5–15.5)
WBC: 7.7 10*3/uL (ref 4.0–10.5)

## 2016-11-21 LAB — RETICULOCYTES
RBC.: 2.71 MIL/uL — ABNORMAL LOW (ref 3.87–5.11)
RETIC CT PCT: 5.9 % — AB (ref 0.4–3.1)
Retic Count, Absolute: 159.9 10*3/uL (ref 19.0–186.0)

## 2016-11-21 LAB — MAGNESIUM: MAGNESIUM: 2.7 mg/dL — AB (ref 1.7–2.4)

## 2016-11-21 LAB — PREPARE RBC (CROSSMATCH)

## 2016-11-21 LAB — PHOSPHORUS: PHOSPHORUS: 18.1 mg/dL — AB (ref 2.5–4.6)

## 2016-11-21 MED ORDER — MORPHINE SULFATE (PF) 2 MG/ML IV SOLN
1.0000 mg | INTRAVENOUS | Status: DC | PRN
  Administered 2016-11-21 – 2016-11-26 (×23): 2 mg via INTRAVENOUS
  Filled 2016-11-21 (×23): qty 1

## 2016-11-21 MED ORDER — SODIUM CHLORIDE 0.9 % IV SOLN
1.0000 g | Freq: Once | INTRAVENOUS | Status: AC
  Administered 2016-11-21: 1 g via INTRAVENOUS
  Filled 2016-11-21: qty 10

## 2016-11-21 MED ORDER — SODIUM CHLORIDE 0.9 % IV SOLN: Freq: Once | INTRAVENOUS | Status: DC

## 2016-11-21 MED ORDER — SODIUM CHLORIDE 0.9 % IV SOLN: 100.0000 mL | INTRAVENOUS | Status: DC | PRN

## 2016-11-21 MED ORDER — LIDOCAINE HCL (PF) 1 % IJ SOLN: 5.0000 mL | INTRAMUSCULAR | Status: DC | PRN

## 2016-11-21 MED ORDER — LIDOCAINE-PRILOCAINE 2.5-2.5 % EX CREA
1.0000 "application " | TOPICAL_CREAM | CUTANEOUS | Status: DC | PRN
  Filled 2016-11-21: qty 5

## 2016-11-21 MED ORDER — PENTAFLUOROPROP-TETRAFLUOROETH EX AERO
1.0000 "application " | INHALATION_SPRAY | CUTANEOUS | Status: DC | PRN
  Administered 2016-11-22: 1 via TOPICAL
  Filled 2016-11-21 (×2): qty 30

## 2016-11-21 NOTE — Consult Note (Signed)
Reason for Consult: Fluid overload and end-stage renal disease Referring Physician: Dr. Steele Sizer Wanda [name] is an 41 y.o. female.  HPI: This is one of multiple admission for this patient was history of Wanda [name], liver cirrhosis, depression, bipolar disorder, end-stage renal disease on maintenance hemodialysis presently came with complaints of increasing leg swelling, abdominal pain. Patient has been admitted to the hospital last month because of similar issue. Patient is very noncompliant with her dialysis and most of the time comes once or twice a week. This-time-however-patient-has-missed-dialysis-for-2-weeks in the emergency room. Presently she has multiple complaints including back pain, some nausea, GI bleeding, weakness. The patient was evaluated she was found to have significant fluid overload and anemia hence admitted to the hospital.  Past Medical History:  Diagnosis Date  . Acute blood loss anemia 02/25/2014   Status post transfusion  . Acute renal failure ([place]) 09/2013   Pre-renal- resolved  . Anasarca 10/10/2013  . Anxiety   . Bipolar disorder ([place]) 12/04/2013   2007-SEEN IN ED FOR INVOLUNTARY COMMITMENT, UDS POS FOR AMPHETAMINES/OPIATES   . Bleeding esophageal varices ([place]) 02/28/2014   s/p banding  . C. difficile colitis 04/19/2014  . Chronic hypotension   . Cirrhosis ([place]) 10/05/13   Liver bx 11/23/13 (delayed initially due to patient refusal). c/w steatohepatitis  . Cirrhosis of liver with ascites ([place])   . Depression   . ESRD (end stage renal disease) on dialysis ([place]) 08/2014  . Folate deficiency 09/2013  . Gastroesophageal junction ulcer 09/17/2014  . GERD (gastroesophageal reflux disease)   . Hematemesis/vomiting blood 02/24/2014  . Macrocytosis 02/28/2014  . PNA (pneumonia) 10/13/2013  . SBP (spontaneous bacterial peritonitis) ([place]) 11/10/2013  . Thrombocytopenia ([place])    Hypercellular bone marrow; abundant megakaryocytes per 08/27/2014; s/p bone marrow bx    Past Surgical  History:  Procedure Laterality Date  . AV FISTULA PLACEMENT Right 11/16/2014   Procedure: Right arm Creation of arteriovenous fistula;  Surgeon: Angelia Mould, MD;  Location: [place];  Service: Vascular;  Laterality: Right;  . CENTRAL VENOUS CATHETER INSERTION Right   . COLONOSCOPY N/A 12/19/2013   SLF:NO OBVIOUS SOURCE FOR ANEMIA IDETIFIED/ONE COLON POLYP REMOVED/Small internal hemorrhoids  . ESOPHAGEAL BANDING  07/04/2014   Procedure: ESOPHAGEAL BANDING;  Surgeon: Daneil Dolin, MD;  [facility];  Service: Endoscopy;;  . ESOPHAGEAL BANDING N/A 07/24/2014   Procedure: ESOPHAGEAL BANDING (2 bands applied);  Surgeon: Danie Binder, MD;  Location: [facility];  Service: Endoscopy;  Laterality: N/A;  . ESOPHAGEAL BANDING N/A 07/23/2015   Procedure: ESOPHAGEAL BANDING;  Surgeon: Danie Binder, MD;  [facility];  Service: Endoscopy;  Laterality: N/A;  . ESOPHAGEAL BANDING N/A 03/04/2016   Procedure: ESOPHAGEAL BANDING;  Surgeon: Danie Binder, MD;  [facility];  Service: Endoscopy;  Laterality: N/A;  . ESOPHAGEAL BANDING N/A 04/30/2016   Procedure: ESOPHAGEAL BANDING;  Surgeon: Daneil Dolin, MD;  [facility];  Service: Endoscopy;  Laterality: N/A;  . ESOPHAGOGASTRODUODENOSCOPY N/A 11/14/2013   SLF:1 column of very small varices in distal esopahgus/MODERATE PORTAL GASTROPATHY IN PROXIMAL STOMACH/MODERATE erosive gastritis  . ESOPHAGOGASTRODUODENOSCOPY N/A 02/11/2014   Dr. Rourk:Esophageal varices with bleeding stigmata-status post esophageal band ligation therapy. Portal gastropathy  . ESOPHAGOGASTRODUODENOSCOPY N/A 07/04/2014   RMR: Persiting grade 2 esophageal varicies with bleeding stigmata status post band ligation. Significantly congested gastric mucosa iwith changes constistant with protal gastropathy.   . ESOPHAGOGASTRODUODENOSCOPY N/A 09/16/2014   Rehman: Single short column of varix  proximal to GE junction not large enough to be banded. Two amall  ulcers at the GEJ felt to be source of GI Bleeding but no active bleeding but no actibe bleeding noted. No therapy rendered. Portal gastropathy NO evidence of peptic ulcer diease or gastric varices.   . ESOPHAGOGASTRODUODENOSCOPY N/A 12/14/2014   SLF: Grade ! esophageal varices. 2. Moderate Portal Gastropathy  . ESOPHAGOGASTRODUODENOSCOPY N/A 03/27/2015   Procedure: ESOPHAGOGASTRODUODENOSCOPY (EGD);  Surgeon: Danie Binder, MD;  [facility];  Service: Endoscopy;  Laterality: N/A;  1045am - moved to 817 @ 11:30  . ESOPHAGOGASTRODUODENOSCOPY N/A 06/05/2015   Procedure: ESOPHAGOGASTRODUODENOSCOPY (EGD);  Surgeon: Clarene Essex, MD;  Location[facility];  Service: Endoscopy;  Laterality: N/A;  . ESOPHAGOGASTRODUODENOSCOPY N/A 07/23/2015   SLF:1. Grade 1 esophageal varices 2. Moderate portal hypertensive gastropathy 3. MILd non-erosive gastritis.   [name] ESOPHAGOGASTRODUODENOSCOPY N/A 03/04/2016   Dr. Oneida Alar: grade 1 varices, surveillance in Jan 2017   . ESOPHAGOGASTRODUODENOSCOPY N/A 05/21/2016   Procedure: ESOPHAGOGASTRODUODENOSCOPY (EGD);  Surgeon: Gatha Mayer, MD;  Location: [facility] ENDOSCOPY;  Service: Endoscopy;  Laterality: N/A;  . ESOPHAGOGASTRODUODENOSCOPY (EGD) WITH PROPOFOL N/A 07/24/2014   SLF:  1. 2 columns grade 2-3 varices- 2 Bands applied.  2.  Moderate gastropathy 3. Duodenal Diverticula  . ESOPHAGOGASTRODUODENOSCOPY (EGD) WITH PROPOFOL N/A 10/26/2014   RMR: 2 columns of grade 2 esophageal varices without obvious bleeding stigmata status post band ligation to complet obliteration of remaining varices.   . ESOPHAGOGASTRODUODENOSCOPY (EGD) WITH PROPOFOL N/A 04/30/2016   Procedure: ESOPHAGOGASTRODUODENOSCOPY (EGD) WITH PROPOFOL;  Surgeon: Daneil Dolin, MD;  [facility];  Service: Endoscopy;  Laterality: N/A;  . None    . PARACENTESIS  Feb 2015   1180 fluid, negative fluid analysis.   [name] PARACENTESIS  10/2013    Family History  Problem Relation Age of Onset  . Heart  disease Mother   . Colon cancer Neg Hx   . Liver disease Neg Hx     Social History:  reports that she quit smoking about 8 years ago. Her smoking use included Cigarettes. She has a 5.00 pack-year smoking history. She has never used smokeless tobacco. She reports that she does not drink alcohol or use drugs.  Allergies:  Allergies  Allergen Reactions  . Lasix [Furosemide] Other (See Comments)    "doesn't work"  . Latex Itching    Medications: I have reviewed the patient's current medications.  Results for orders placed or performed during the hospital encounter of 11/20/16 (from the past 48 hour(s))  Comprehensive metabolic panel     Status: Abnormal   Collection Time: 11/20/16  3:19 PM  Result Value Ref Range   Sodium 132 (L) 135 - 145 mmol/L   Potassium 5.2 (H) 3.5 - 5.1 mmol/L   Chloride 97 (L) 101 - 111 mmol/L   CO2 8 (L) 22 - 32 mmol/L   Glucose, Bld 138 (H) 65 - 99 mg/dL   BUN 150 (H) 6 - 20 mg/dL    Comment: RESULTS CONFIRMED BY MANUAL DILUTION   Creatinine, Ser 21.99 (H) 0.44 - 1.00 mg/dL   Calcium 5.6 (LL) 8.9 - 10.3 mg/dL    Comment: CRITICAL RESULT CALLED TO, READ BACK BY AND VERIFIED WITH: VAUGHN,J ON 11/20/16 AT 1730 BY LOY,C    Total Protein 6.5 6.5 - 8.1 g/dL   Albumin 2.8 (L) 3.5 - 5.0 g/dL   AST 38 15 - 41 U/L   ALT 22 14 - 54 U/L   Alkaline  Phosphatase 112 38 - 126 U/L   Total Bilirubin 0.8 0.3 - 1.2 mg/dL   GFR calc non Af Amer 2 (L) >60 mL/min   GFR calc Af Amer 2 (L) >60 mL/min    Comment: (NOTE) The eGFR has been calculated using the CKD EPI equation. This calculation has not been validated in all clinical situations. eGFR's persistently <60 mL/min signify possible Chronic Kidney Disease.    Anion gap 27 (H) 5 - 15  Lipase, blood     Status: Abnormal   Collection Time: 11/20/16  3:19 PM  Result Value Ref Range   Lipase 193 (H) 11 - 51 U/L  CBC WITH DIFFERENTIAL     Status: Abnormal   Collection Time: 11/20/16  3:19 PM  Result Value Ref Range    WBC 9.5 4.0 - 10.5 K/uL   RBC 2.02 (L) 3.87 - 5.11 MIL/uL   Hemoglobin 6.8 (LL) 12.0 - 15.0 g/dL    Comment: REPEATED TO VERIFY CRITICAL RESULT CALLED TO, READ BACK BY AND VERIFIED WITH: VAUGHN,JASON RN AT 1400 BY HFLYNT 11/20/16    HCT 19.8 (L) 36.0 - 46.0 %   MCV 98.0 78.0 - 100.0 fL   MCH 33.7 26.0 - 34.0 pg   MCHC 34.3 30.0 - 36.0 g/dL   RDW 17.9 (H) 11.5 - 15.5 %   Platelets 138 (L) 150 - 400 K/uL   Neutrophils Relative % 90 %   Neutro Abs 8.6 (H) 1.7 - 7.7 K/uL   Lymphocytes Relative 4 %   Lymphs Abs 0.4 (L) 0.7 - 4.0 K/uL   Monocytes Relative 6 %   Monocytes Absolute 0.5 0.1 - 1.0 K/uL   Eosinophils Relative 0 %   Eosinophils Absolute 0.0 0.0 - 0.7 K/uL   Basophils Relative 0 %   Basophils Absolute 0.0 0.0 - 0.1 K/uL  Ammonia     Status: Abnormal   Collection Time: 11/20/16  3:19 PM  Result Value Ref Range   Ammonia 66 (H) 9 - 35 umol/L  Magnesium     Status: Abnormal   Collection Time: 11/20/16  3:36 PM  Result Value Ref Range   Magnesium 2.7 (H) 1.7 - 2.4 mg/dL  POC occult blood, ED Provider will collect     Status: Abnormal   Collection Time: 11/20/16  4:07 PM  Result Value Ref Range   Fecal Occult Bld POSITIVE (A) NEGATIVE  Type and screen [facility]     Status: None (Preliminary result)   Collection Time: 11/20/16  4:52 PM  Result Value Ref Range   ABO/RH(D) A POS    Antibody Screen NEG    Sample Expiration 11/23/2016    Unit Number [id number]    Blood Component Type RED CELLS,LR    Unit division 00    Status of Unit ISSUED    Transfusion Status OK TO TRANSFUSE    Crossmatch Result Compatible    Unit Number [id number]    Blood Component Type RED CELLS,LR    Unit division 00    Status of Unit ISSUED,FINAL    Transfusion Status OK TO TRANSFUSE    Crossmatch Result Compatible    Unit Number [id number]    Blood Component Type RED CELLS,LR    Unit division 00    Status of Unit ISSUED    Transfusion Status OK TO TRANSFUSE     Crossmatch Result Compatible   Protime-INR     Status: Abnormal   Collection Time: 11/20/16  4:52 PM  Result Value Ref  Range   Prothrombin Time 15.5 (H) 11.4 - 15.2 seconds   INR 1.22   APTT     Status: None   Collection Time: 11/20/16  4:52 PM  Result Value Ref Range   aPTT 36 24 - 36 seconds  Phosphorus     Status: Abnormal   Collection Time: 11/20/16  5:02 PM  Result Value Ref Range   Phosphorus 18.5 (H) 2.5 - 4.6 mg/dL    Comment: RESULTS CONFIRMED BY MANUAL DILUTION  Blood gas, venous     Status: Abnormal   Collection Time: 11/20/16  7:24 PM  Result Value Ref Range   pH, Ven 7.219 (L) 7.250 - 7.430   pCO2, Ven 22.1 (L) 44.0 - 60.0 mmHg   pO2, Ven 114.0 (H) 32.0 - 45.0 mmHg   Bicarbonate 10.9 (L) 20.0 - 28.0 mmol/L   Acid-Base Excess 17.6 (H) 0.0 - 2.0 mmol/L   O2 Saturation 96.8 %   Drawn by [id number]    Sample type VEIN   MRSA PCR Screening     Status: None   Collection Time: 11/20/16  9:45 PM  Result Value Ref Range   MRSA by PCR NEGATIVE NEGATIVE    Comment:        The GeneXpert MRSA Assay (FDA approved for NASAL specimens only), is one component of a comprehensive MRSA colonization surveillance program. It is not intended to diagnose MRSA infection nor to guide or monitor treatment for MRSA infections.   Prepare RBC     Status: None   Collection Time: 11/20/16  9:57 PM  Result Value Ref Range   Order Confirmation ORDER PROCESSED BY BLOOD BANK   Prepare RBC     Status: None   Collection Time: 11/21/16  3:30 AM  Result Value Ref Range   Order Confirmation ORDER PROCESSED BY BLOOD BANK     Dg Abd Acute W/chest  Result Date: 11/20/2016 CLINICAL DATA:  Abdominal pain. History of end-stage renal disease on dialysis, pneumonia, cirrhosis. EXAM: DG ABDOMEN ACUTE W/ 1V CHEST COMPARISON:  Chest radiograph October 09, 2016 FINDINGS: Increased diffuse interstitial and alveolar airspace opacities. Cardiac silhouette is mildly enlarged, unchanged. Calcified aortic knob. No  pleural effusion. No pneumothorax. Hazy appearance of the abdomen suggests ascites. No intra-abdominal mass effect or pathologic calcification on this habitus limited examination. Bowel gas pattern is nondilated and nonobstructive. Soft tissue planes and included osseous structures are nonsuspicious. IMPRESSION: Interstitial and alveolar airspace opacities concerning for multifocal pneumonia, possible underlying pulmonary edema. Mild cardiomegaly. Probable ascites.  Nonspecific bowel gas pattern. Electronically Signed   By: Elon Alas M.D.   On: 11/20/2016 16:19    Review of Systems  Constitutional: Positive for malaise/fatigue.  Respiratory: Positive for shortness of breath. Negative for cough and hemoptysis.   Cardiovascular: Positive for leg swelling. Negative for orthopnea and PND.  Gastrointestinal: Positive for abdominal pain, blood in stool and nausea. Negative for vomiting.  Musculoskeletal: Positive for back pain.  Neurological: Positive for weakness.   Blood pressure (!) 107/53, pulse 72, temperature 97.2 F (36.2 C), temperature source Oral, resp. rate 13, height 5' 6"  (1.676 m), weight 77.8 kg (171 lb 8.3 oz), last menstrual period 09/21/2013, SpO2 99 %. Physical Exam  Constitutional: No distress.  Eyes: No scleral icterus.  Neck: JVD present.  Cardiovascular: Normal rate and regular rhythm.   Respiratory: No respiratory distress. She has rales.  GI: She exhibits distension. There is tenderness. There is no rebound and no guarding.  Musculoskeletal: She exhibits edema.  Neurological: She is alert.    Assessment/Plan: Problem #1 GI bleeding: Recurrent problem. Patient had previous bleeding from esophageal varices. Presently patient might have uremic bleeding as her BUN and creatinine is very high. Patient has received some blood transfusion. Problem #2 end-stage renal disease: Her BUN and creatinine is very high. As stated above she missed her dialysis for more than 2  weeks. Most of the time patient make a decision depending how she feels. Patient has a boyfriend who can transport her however patient refused to come to dialysis for different reasons. Problem #3 hyperkalemia: Problem #4 history of liver cirrhosis with ascites: Patient required multiple paracentesis. Problem #5 history of bipolar disorder Problem #6 history of GI bleeding Problem #7 metabolic bone disease: Calcium is normal presently phosphorus is not available. Plan: 1] Because of high BUN and cough dialysis for the last 2 weeks will dialyze patient today for 2 hours. 2] We'll dialyze her again tomorrow for 3 hours. 3] We'll remove about 2 L today and then we'll attempt more tomorrow 4] We'll check renal panel, CBC in the morning. 5] have discussed with the patient about importance of dialysis. Presently patient want to continue his dialysis.  Daiveon Markman S 11/21/2016, 8:11 AM

## 2016-11-21 NOTE — Procedures (Signed)
    HEMODIALYSIS TREATMENT NOTE:  2 hour heparin-free dialysis completed right upper arm AVF (16g/antegrade). Goal met: 2L removed without interruption in ultrafiltration.  All blood was returned and hemostasis was achieved within 10 minutes. Report given to Dorrene German, RN.  Rockwell Alexandria, RN, CDN

## 2016-11-21 NOTE — Progress Notes (Signed)
Began to administer the first unit of two units of blood to be given as ordered, IV had infiltrated about 30 minutes after beginning blood. Transfusion was stopped until new access could be obtained. After multiple insertion attempts by several nurses, IV access was obtained. At this time the blood had been left off for over 2 hours and would not have been completed within the protocal's 4 hour time frame. Spoke to the lab staff and they had stated there is only one unit left of A positive blood in the hospital. MD was called notified of the entire situation and had requested for another unit to be ordered in order for the patient to receive the 2 full units of blood as planned. Orders put in and completed.

## 2016-11-21 NOTE — Progress Notes (Signed)
PROGRESS NOTE    Wanda [name]  UEA:[id number] DOB: Aug 02, 1976 DOA: 11/20/2016 PCP: [name], MD    Brief Narrative: Wanda [name] is a 41 y.o. female with a history of ESRD on dialysis, NASH with cirrhosis and ascites, depression, bipolar, depression, chronic pain, h/o esophageal varices with bleeding, presents with abdominal pain since 3 weeks, and sob , last HD was 3 weeks ago. On arrival to she was found to be in pulmonary edema. She was admitted for further evaluation. Nephrology consulted and recommendations given.   Assessment & Plan:   Principal Problem:   Abdominal pain Active Problems:   Liver cirrhosis secondary to nonalcoholic steatohepatitis (NASH) (HCC)   ESRD on dialysis (HCC)   Ascites of liver   GI bleed   Acute blood loss anemia   Pulmonary edema   Hypocalcemia   Non compliance to hd, ESRD on HD:  Last HD was 3 weeks ago, nephrology consulted and plan for HD today and tomorrow.   Pulmonary Edema:  Suspect from non compliance to medications.  Should improve with HD and monitor.   Abdominal pain:  Secondary to ascites and mild pancreatitis with elevated lipase.  US paracentesis ordered and repeat lipase levels in am.  CT abdomen and pelvis will be ordered if her pain doesn't improve.   NASH with cirrhosis:  Start patient on lactulose, and repeat ammonia levels.    Acute encephalopathy from elevated ammonia levels.  Monitor. Improving.    Anemia: suspect anemia of chronic disease and possibly anemia of blood loss. Stool for occult blood is positive. Pt has h/o  of variceal bleeding. Currently she denies any hematochezia or hematemesis. Anemia panel sent and 2 units of prbc transfusion ordered.  Repeat hemoglobin ordered tonight.   Hypocalcemia: replaced.   Hyperkalemia and hyponatremia possibly from ESRD.        DVT prophylaxis: LOVENOX. ) Code Status: (Full) Family Communication: NONE AT BEDSIDE.  Disposition Plan: pending  further evaluation.    Consultants:   Nephrology.    Procedures: HD   Antimicrobials: none.    Subjective: Alert, requesting for pain control.   Objective: Vitals:   11/21/16 0741 11/21/16 0826 11/21/16 1151 11/21/16 1200  BP:      Pulse: 72  81 79  Resp: 13  13 14   Temp: 97.2 F (36.2 C) 97.7 F (36.5 C) 97.2 F (36.2 C)   TempSrc: Oral Axillary Oral   SpO2: 99%  98% 99%  Weight:      Height:        Intake/Output Summary (Last 24 hours) at 11/21/16 1208 Last data filed at 11/21/16 0914  Gross per 24 hour  Intake             1360 ml  Output                0 ml  Net             1360 ml   Filed Weights   11/20/16 1505 11/20/16 2147 11/21/16 0500  Weight: 66.2 kg (146 lb) 94.9 kg (209 lb 3.5 oz) 77.8 kg (171 lb 8.3 oz)    Examination:  General exam: Appears calm and comfortable  Respiratory system: Clear to auscultation. Respiratory effort normal. Cardiovascular system: S1 & S2 heard, RRR. No JVD, murmurs, rubs, gallops or clicks. No pedal edema. Gastrointestinal system: Abdomen is distended, tender to palpation, generalized. No organomegaly or masses felt. Normal bowel sounds heard. Central nervous system: Alert and oriented. No focal  neurological deficits. Extremities: Symmetric 5 x 5 power. Skin: No rashes, lesions or ulcers Psychiatry: Judgement and insight appear normal. Mood & affect appropriate.     Data Reviewed: I have personally reviewed following labs and imaging studies  CBC:  Recent Labs Lab 11/20/16 1519  WBC 9.5  NEUTROABS 8.6*  HGB 6.8*  HCT 19.8*  MCV 98.0  PLT 606*   Basic Metabolic Panel:  Recent Labs Lab 11/20/16 1519 11/20/16 1536 11/20/16 1702  NA 132*  --   --   K 5.2*  --   --   CL 97*  --   --   CO2 8*  --   --   GLUCOSE 138*  --   --   BUN 150*  --   --   CREATININE 21.99*  --   --   CALCIUM 5.6*  --   --   MG  --  2.7*  --   PHOS  --   --  18.5*   GFR: Estimated Creatinine Clearance: 3.5 mL/min (A) (by  C-G formula based on SCr of 21.99 mg/dL (H)). Liver Function Tests:  Recent Labs Lab 11/20/16 1519  AST 38  ALT 22  ALKPHOS 112  BILITOT 0.8  PROT 6.5  ALBUMIN 2.8*    Recent Labs Lab 11/20/16 1519  LIPASE 193*    Recent Labs Lab 11/20/16 1519  AMMONIA 66*   Coagulation Profile:  Recent Labs Lab 11/20/16 1652  INR 1.22   Cardiac Enzymes: No results for input(s): CKTOTAL, CKMB, CKMBINDEX, TROPONINI in the last 168 hours. BNP (last 3 results) No results for input(s): PROBNP in the last 8760 hours. HbA1C: No results for input(s): HGBA1C in the last 72 hours. CBG: No results for input(s): GLUCAP in the last 168 hours. Lipid Profile: No results for input(s): CHOL, HDL, LDLCALC, TRIG, CHOLHDL, LDLDIRECT in the last 72 hours. Thyroid Function Tests: No results for input(s): TSH, T4TOTAL, FREET4, T3FREE, THYROIDAB in the last 72 hours. Anemia Panel: No results for input(s): VITAMINB12, FOLATE, FERRITIN, TIBC, IRON, RETICCTPCT in the last 72 hours. Sepsis Labs: No results for input(s): PROCALCITON, LATICACIDVEN in the last 168 hours.  Recent Results (from the past 240 hour(s))  MRSA PCR Screening     Status: None   Collection Time: 11/20/16  9:45 PM  Result Value Ref Range Status   MRSA by PCR NEGATIVE NEGATIVE Final    Comment:        The GeneXpert MRSA Assay (FDA approved for NASAL specimens only), is one component of a comprehensive MRSA colonization surveillance program. It is not intended to diagnose MRSA infection nor to guide or monitor treatment for MRSA infections.          Radiology Studies: Dg Abd Acute W/chest  Result Date: 11/20/2016 CLINICAL DATA:  Abdominal pain. History of end-stage renal disease on dialysis, pneumonia, cirrhosis. EXAM: DG ABDOMEN ACUTE W/ 1V CHEST COMPARISON:  Chest radiograph October 09, 2016 FINDINGS: Increased diffuse interstitial and alveolar airspace opacities. Cardiac silhouette is mildly enlarged, unchanged.  Calcified aortic knob. No pleural effusion. No pneumothorax. Hazy appearance of the abdomen suggests ascites. No intra-abdominal mass effect or pathologic calcification on this habitus limited examination. Bowel gas pattern is nondilated and nonobstructive. Soft tissue planes and included osseous structures are nonsuspicious. IMPRESSION: Interstitial and alveolar airspace opacities concerning for multifocal pneumonia, possible underlying pulmonary edema. Mild cardiomegaly. Probable ascites.  Nonspecific bowel gas pattern. Electronically Signed   By: Elon Alas M.D.   On: 11/20/2016 16:19  Scheduled Meds: . sodium chloride   Intravenous Once  . lactulose  30 g Oral TID  . midodrine  10 mg Oral Q T,Th,Sa-HD  . pantoprazole (PROTONIX) IV  40 mg Intravenous Q12H  . sevelamer carbonate  1,600 mg Oral TID WC  . sucralfate  1 g Oral TID WC & HS   Continuous Infusions:   LOS: 1 day    Time spent: *35 minutes.     Hosie Poisson, MD [facility] Pager [phone]  If 7PM-7AM, please contact night-coverage [email or web address] Password [facility] 11/21/2016, 12:08 PM

## 2016-11-22 ENCOUNTER — Inpatient Hospital Stay (HOSPITAL_COMMUNITY): Payer: Medicare Other

## 2016-11-22 LAB — BPAM RBC
BLOOD PRODUCT EXPIRATION DATE: 201805092359
BLOOD PRODUCT EXPIRATION DATE: 201805092359
Blood Product Expiration Date: 201805092359
ISSUE DATE / TIME: 201804132320
ISSUE DATE / TIME: 201804140213
ISSUE DATE / TIME: 201804140522
Unit Type and Rh: 6200
Unit Type and Rh: 6200
Unit Type and Rh: 6200

## 2016-11-22 LAB — CBC
HEMATOCRIT: 24.4 % — AB (ref 36.0–46.0)
HEMOGLOBIN: 7.9 g/dL — AB (ref 12.0–15.0)
MCH: 30.7 pg (ref 26.0–34.0)
MCHC: 32.4 g/dL (ref 30.0–36.0)
MCV: 94.9 fL (ref 78.0–100.0)
Platelets: 76 10*3/uL — ABNORMAL LOW (ref 150–400)
RBC: 2.57 MIL/uL — ABNORMAL LOW (ref 3.87–5.11)
RDW: 20.1 % — AB (ref 11.5–15.5)
WBC: 7.3 10*3/uL (ref 4.0–10.5)

## 2016-11-22 LAB — RENAL FUNCTION PANEL
ALBUMIN: 2.6 g/dL — AB (ref 3.5–5.0)
Anion gap: 23 — ABNORMAL HIGH (ref 5–15)
BUN: 147 mg/dL — ABNORMAL HIGH (ref 6–20)
CHLORIDE: 99 mmol/L — AB (ref 101–111)
CO2: 12 mmol/L — ABNORMAL LOW (ref 22–32)
Calcium: 5.6 mg/dL — CL (ref 8.9–10.3)
Creatinine, Ser: 16.55 mg/dL — ABNORMAL HIGH (ref 0.44–1.00)
GFR, EST AFRICAN AMERICAN: 3 mL/min — AB (ref >60)
GFR, EST NON AFRICAN AMERICAN: 2 mL/min — AB (ref >60)
Glucose, Bld: 99 mg/dL (ref 65–99)
PHOSPHORUS: 13.7 mg/dL — AB (ref 2.5–4.6)
POTASSIUM: 4.3 mmol/L (ref 3.5–5.1)
Sodium: 134 mmol/L — ABNORMAL LOW (ref 135–145)

## 2016-11-22 LAB — TYPE AND SCREEN
ABO/RH(D): A POS
Antibody Screen: NEGATIVE
UNIT DIVISION: 0
Unit division: 0
Unit division: 0

## 2016-11-22 LAB — CALCIUM, IONIZED

## 2016-11-22 MED ORDER — PENTAFLUOROPROP-TETRAFLUOROETH EX AERO
INHALATION_SPRAY | CUTANEOUS | Status: AC
  Administered 2016-11-22: 1 via TOPICAL
  Filled 2016-11-22: qty 103.5

## 2016-11-22 MED ORDER — ALBUMIN HUMAN 25 % IV SOLN
INTRAVENOUS | Status: AC
  Administered 2016-11-22: 50 g via INTRAVENOUS
  Filled 2016-11-22: qty 200

## 2016-11-22 MED ORDER — EPOETIN ALFA 10000 UNIT/ML IJ SOLN
10000.0000 [IU] | Freq: Once | INTRAMUSCULAR | Status: AC
  Administered 2016-11-22: 10000 [IU] via INTRAVENOUS
  Filled 2016-11-22: qty 1

## 2016-11-22 MED ORDER — ONDANSETRON HCL 4 MG/2ML IJ SOLN
4.0000 mg | Freq: Four times a day (QID) | INTRAMUSCULAR | Status: DC | PRN
  Administered 2016-11-22 – 2016-11-24 (×2): 4 mg via INTRAVENOUS
  Filled 2016-11-22 (×2): qty 2

## 2016-11-22 MED ORDER — CALCIUM ACETATE (PHOS BINDER) 667 MG PO CAPS
1334.0000 mg | ORAL_CAPSULE | ORAL | Status: DC
  Administered 2016-11-22 – 2016-11-25 (×4): 1334 mg via ORAL
  Filled 2016-11-22 (×2): qty 2

## 2016-11-22 MED ORDER — ALBUMIN HUMAN 25 % IV SOLN
50.0000 g | Freq: Once | INTRAVENOUS | Status: AC
  Administered 2016-11-22: 50 g via INTRAVENOUS

## 2016-11-22 MED ORDER — CALCIUM ACETATE (PHOS BINDER) 667 MG PO CAPS
2668.0000 mg | ORAL_CAPSULE | Freq: Three times a day (TID) | ORAL | Status: DC
  Administered 2016-11-22 – 2016-11-23 (×4): 2668 mg via ORAL
  Administered 2016-11-24 (×2): 2001 mg via ORAL
  Administered 2016-11-25 – 2016-11-26 (×3): 2668 mg via ORAL
  Filled 2016-11-22 (×14): qty 4

## 2016-11-22 MED ORDER — EPOETIN ALFA 10000 UNIT/ML IJ SOLN
INTRAMUSCULAR | Status: AC
  Administered 2016-11-22: 10000 [IU] via INTRAVENOUS
  Filled 2016-11-22: qty 1

## 2016-11-22 MED ORDER — RIFAXIMIN 550 MG PO TABS
550.0000 mg | ORAL_TABLET | Freq: Two times a day (BID) | ORAL | Status: DC
  Administered 2016-11-22 – 2016-11-23 (×4): 550 mg via ORAL
  Filled 2016-11-22 (×5): qty 1

## 2016-11-22 NOTE — Progress Notes (Signed)
PROGRESS NOTE    Wanda [name]  FWY:[id number] DOB: 1976/07/01 DOA: 11/20/2016 PCP: [name], MD    Brief Narrative: Wanda [name] is a 41 y.o. female with a history of ESRD on dialysis, NASH with cirrhosis and ascites, depression, bipolar, depression, chronic pain, h/o esophageal varices with bleeding, presents with abdominal pain since 3 weeks, and sob , last HD was 3 weeks ago. On arrival to she was found to be in pulmonary edema. She was admitted for further evaluation. Nephrology consulted and recommendations given.   Assessment & Plan:   Principal Problem:   Abdominal pain Active Problems:   Liver cirrhosis secondary to nonalcoholic steatohepatitis (NASH) (HCC)   ESRD on dialysis (HCC)   Ascites of liver   GI bleed   Acute blood loss anemia   Pulmonary edema   Hypocalcemia   Non compliance to hd, ESRD on HD:  Last HD was 3 weeks ago, nephrology consulted and plan for HD today and tomorrow. Improving CXR findings of interstitial edema.   Pulmonary Edema:  Suspect from non compliance to medications.  Should improve with HD and monitor. Repeat CXR show improving interstitial edema.   Abdominal pain: no change.  Secondary to ascites and mild pancreatitis with elevated lipase.  US paracentesis ordered, plan it today and repeat lipase levels in am.  CT abdomen and pelvis will be ordered if her pain doesn't improve.   NASH with cirrhosis:  Start patient on lactulose, and repeat ammonia levels no improvement. Rifaximin added in addition to lactulose, 2 BM so far.    Acute encephalopathy from elevated ammonia levels.  Monitor. Improving.    Anemia: suspect anemia of chronic disease and possibly anemia of blood loss. Stool for occult blood is positive. Pt has h/o  of variceal bleeding. Currently she denies any hematochezia or hematemesis. Anemia panel sent and 2 units of prbc transfusion ordered.  Repeat hemoglobin dropped to 7.9, please transfuse tok eep  hemoglobin greater than 7.   Hypocalcemia: replaced. Repeat calcium level in am.   Hyperkalemia and hyponatremia possibly from ESRD.  Hyperkalemia resolved. Hyponatremia improving.    In view of the multiple co morbidities and non compliance to medications, will request palliative care consult for goals of care and symptom management in am.     DVT prophylaxis: LOVENOX. ) Code Status: (Full) Family Communication: NONE AT BEDSIDE.  Disposition Plan: pending further evaluation.    Consultants:   Nephrology.    Procedures: HD   Antimicrobials: none.    Subjective: Alert, requesting for pain meds.   Objective: Vitals:   11/22/16 1100 11/22/16 1115 11/22/16 1130 11/22/16 1145  BP: (!) 86/71 90/68 121/61 111/62  Pulse: 84 79 77 77  Resp:   14 14  Temp:      TempSrc:      SpO2:      Weight:      Height:        Intake/Output Summary (Last 24 hours) at 11/22/16 1153 Last data filed at 11/22/16 0845  Gross per 24 hour  Intake              480 ml  Output             2000 ml  Net            -1520 ml   Filed Weights   11/21/16 1315 11/22/16 0500 11/22/16 1045  Weight: 77.8 kg (171 lb 8.3 oz) 75.6 kg (166 lb 10.7 oz) 75.7 kg (166 lb  14.2 oz)    Examination:  General exam: Appears in mild distress from abd distention Respiratory system: diminished at bases.  Cardiovascular system: S1 & S2 heard, RRR. No JVD, murmurs, rubs, gallops or clicks. No pedal edema. Gastrointestinal system: Abdomen is distended, tender to palpation, generalized. No organomegaly or masses felt. Normal bowel sounds heard. Central nervous system: Alert and oriented. No focal neurological deficits. Extremities: Symmetric 5 x 5 power. Skin: No rashes, lesions or ulcers     Data Reviewed: I have personally reviewed following labs and imaging studies  CBC:  Recent Labs Lab 11/20/16 1519 11/21/16 1128 11/22/16 0446  WBC 9.5 7.7 7.3  NEUTROABS 8.6*  --   --   HGB 6.8* 8.7* 7.9*  HCT  19.8* 25.2* 24.4*  MCV 98.0 93.0 94.9  PLT 138* 110* 76*   Basic Metabolic Panel:  Recent Labs Lab 11/20/16 1519 11/20/16 1536 11/20/16 1702 11/21/16 1128 11/22/16 0446  NA 132*  --   --  129* 134*  K 5.2*  --   --  5.3* 4.3  CL 97*  --   --  96* 99*  CO2 8*  --   --  8* 12*  GLUCOSE 138*  --   --  111* 99  BUN 150*  --   --  191* 147*  CREATININE 21.99*  --   --  21.90* 16.55*  CALCIUM 5.6*  --   --  5.6* 5.6*  MG  --  2.7*  --  2.7*  --   PHOS  --   --  18.5* 18.1* 13.7*   GFR: Estimated Creatinine Clearance: 4.6 mL/min (A) (by C-G formula based on SCr of 16.55 mg/dL (H)). Liver Function Tests:  Recent Labs Lab 11/20/16 1519 11/22/16 0446  AST 38  --   ALT 22  --   ALKPHOS 112  --   BILITOT 0.8  --   PROT 6.5  --   ALBUMIN 2.8* 2.6*    Recent Labs Lab 11/20/16 1519  LIPASE 193*    Recent Labs Lab 11/20/16 1519 11/21/16 1128  AMMONIA 66* 69*   Coagulation Profile:  Recent Labs Lab 11/20/16 1652  INR 1.22   Cardiac Enzymes: No results for input(s): CKTOTAL, CKMB, CKMBINDEX, TROPONINI in the last 168 hours. BNP (last 3 results) No results for input(s): PROBNP in the last 8760 hours. HbA1C: No results for input(s): HGBA1C in the last 72 hours. CBG: No results for input(s): GLUCAP in the last 168 hours. Lipid Profile: No results for input(s): CHOL, HDL, LDLCALC, TRIG, CHOLHDL, LDLDIRECT in the last 72 hours. Thyroid Function Tests: No results for input(s): TSH, T4TOTAL, FREET4, T3FREE, THYROIDAB in the last 72 hours. Anemia Panel:  Recent Labs  11/21/16 1128  RETICCTPCT 5.9*   Sepsis Labs: No results for input(s): PROCALCITON, LATICACIDVEN in the last 168 hours.  Recent Results (from the past 240 hour(s))  MRSA PCR Screening     Status: None   Collection Time: 11/20/16  9:45 PM  Result Value Ref Range Status   MRSA by PCR NEGATIVE NEGATIVE Final    Comment:        The GeneXpert MRSA Assay (FDA approved for NASAL specimens only), is  one component of a comprehensive MRSA colonization surveillance program. It is not intended to diagnose MRSA infection nor to guide or monitor treatment for MRSA infections.          Radiology Studies: Dg Chest Port 1 View  Result Date: 11/22/2016 CLINICAL DATA:  Pulmonary  edema. EXAM: PORTABLE CHEST 1 VIEW COMPARISON:  Acute abdominal series 11/20/2016 FINDINGS: Heart is mildly enlarged. Interstitial edema is improved. Previously-seen upper lobe airspace consolidation is improved. The left base is not imaged. Atherosclerotic calcifications are present at the aortic arch. IMPRESSION: 1. Cardiomegaly with improving interstitial edema. Electronically Signed   By: San Morelle M.D.   On: 11/22/2016 10:30   Dg Abd Acute W/chest  Result Date: 11/20/2016 CLINICAL DATA:  Abdominal pain. History of end-stage renal disease on dialysis, pneumonia, cirrhosis. EXAM: DG ABDOMEN ACUTE W/ 1V CHEST COMPARISON:  Chest radiograph October 09, 2016 FINDINGS: Increased diffuse interstitial and alveolar airspace opacities. Cardiac silhouette is mildly enlarged, unchanged. Calcified aortic knob. No pleural effusion. No pneumothorax. Hazy appearance of the abdomen suggests ascites. No intra-abdominal mass effect or pathologic calcification on this habitus limited examination. Bowel gas pattern is nondilated and nonobstructive. Soft tissue planes and included osseous structures are nonsuspicious. IMPRESSION: Interstitial and alveolar airspace opacities concerning for multifocal pneumonia, possible underlying pulmonary edema. Mild cardiomegaly. Probable ascites.  Nonspecific bowel gas pattern. Electronically Signed   By: Elon Alas M.D.   On: 11/20/2016 16:19        Scheduled Meds: . sodium chloride   Intravenous Once  . calcium acetate  1,334 mg Oral With snacks  . calcium acetate  2,668 mg Oral TID WC  . lactulose  30 g Oral TID  . midodrine  10 mg Oral Q T,Th,Sa-HD  . pantoprazole (PROTONIX)  IV  40 mg Intravenous Q12H  . rifaximin  550 mg Oral BID  . sucralfate  1 g Oral TID WC & HS   Continuous Infusions:   LOS: 2 days    Time spent: *35 minutes.     Hosie Poisson, MD [facility] Pager [phone]  If 7PM-7AM, please contact night-coverage [email or web address] Password [facility] 11/22/2016, 11:53 AM

## 2016-11-22 NOTE — Progress Notes (Signed)
Subjective: Interval History: has no complaint of nausea or vomiting. Patient is still that she is feeling somewhat better..  Objective: Vital signs in last 24 hours: Temp:  [97.2 F (36.2 C)-98.9 F (37.2 C)] 98.5 F (36.9 C) (04/15 0711) Pulse Rate:  [75-88] 75 (04/15 0800) Resp:  [13-16] 15 (04/15 0800) BP: (106-127)/(52-66) 107/54 (04/15 0800) SpO2:  [98 %-100 %] 99 % (04/15 0800) Weight:  [75.6 kg (166 lb 10.7 oz)-77.8 kg (171 lb 8.3 oz)] 75.6 kg (166 lb 10.7 oz) (04/15 0500) Weight change: 11.6 kg (25 lb 8.3 oz)  Intake/Output from previous day: 04/14 0701 - 04/15 0700 In: 822 [P.O.:480; Blood:342] Out: 2000  Intake/Output this shift: No intake/output data recorded.  General appearance: alert, cooperative and no distress Resp: diminished breath sounds bilaterally Cardio: regular rate and rhythm Extremities: edema 2-3+ edema  Lab Results:  Recent Labs  11/21/16 1128 11/22/16 0446  WBC 7.7 7.3  HGB 8.7* 7.9*  HCT 25.2* 24.4*  PLT 110* 76*   BMET:  Recent Labs  11/21/16 1128 11/22/16 0446  NA 129* 134*  K 5.3* 4.3  CL 96* 99*  CO2 8* 12*  GLUCOSE 111* 99  BUN 191* 147*  CREATININE 21.90* 16.55*  CALCIUM 5.6* 5.6*   No results for input(s): PTH in the last 72 hours. Iron Studies: No results for input(s): IRON, TIBC, TRANSFERRIN, FERRITIN in the last 72 hours.  Studies/Results: Dg Abd Acute W/chest  Result Date: 11/20/2016 CLINICAL DATA:  Abdominal pain. History of end-stage renal disease on dialysis, pneumonia, cirrhosis. EXAM: DG ABDOMEN ACUTE W/ 1V CHEST COMPARISON:  Chest radiograph October 09, 2016 FINDINGS: Increased diffuse interstitial and alveolar airspace opacities. Cardiac silhouette is mildly enlarged, unchanged. Calcified aortic knob. No pleural effusion. No pneumothorax. Hazy appearance of the abdomen suggests ascites. No intra-abdominal mass effect or pathologic calcification on this habitus limited examination. Bowel gas pattern is nondilated  and nonobstructive. Soft tissue planes and included osseous structures are nonsuspicious. IMPRESSION: Interstitial and alveolar airspace opacities concerning for multifocal pneumonia, possible underlying pulmonary edema. Mild cardiomegaly. Probable ascites.  Nonspecific bowel gas pattern. Electronically Signed   By: Elon Alas M.D.   On: 11/20/2016 16:19    I have reviewed the patient's current medications.  Assessment/Plan: Problem #1 anasarca: Status post hemodialysis yesterday with 2 L fluid removal. Presently patient is feeling better. Patient however has significant anasarca. Problem #2 end-stage renal disease/uremia. Patient was dialyzed for 2 hours yesterday and her BUN has come down from 191-147. Still very high. Problem #3 metabolic acidosis: Her CO2 is 12 improving Problem #4 metabolic bone disease: Calcium is very low however her phosphorus is very high. It was 18 and slightly coming down. Patient was not taking her binders. Problem #5 anemia: She is status post blood transfusion. Her hemoglobin remains low. Problem #6 liver cirrhosis Plan: 1] Still will dialyze her for short periods 3 hours today 2] We'll try to remove about 3 L if her blood pressure tolerates 3] Will DC Renvela 4] will start her on PhosLo 667 mg 4 tablets by mouth 3 times a day with meals and 2 with a snack 5] will use Epogen 10,000 units IV after each dialysis 6] will make arrangements for her to get dialysis again tomorrow.    LOS: 2 days   Melyna Huron S 11/22/2016,8:26 AM

## 2016-11-22 NOTE — Procedures (Signed)
   HEMODIALYSIS TREATMENT NOTE:  3 hour heparin-free dialysis completed via right upper arm AVF (15g/antegrade). Goal met: Albumin 50g given at start of HD and 3 liters were removed without interruption in ultrafiltration.  All blood was returned and hemostasis was achieved within 15 minutes. Report given to [facility], Therapist, sports.  Rockwell Alexandria, RN, CDN

## 2016-11-23 ENCOUNTER — Inpatient Hospital Stay (HOSPITAL_COMMUNITY): Payer: Medicare Other

## 2016-11-23 ENCOUNTER — Encounter (HOSPITAL_COMMUNITY): Payer: Self-pay | Admitting: Primary Care

## 2016-11-23 ENCOUNTER — Other Ambulatory Visit (HOSPITAL_COMMUNITY): Payer: Self-pay

## 2016-11-23 DIAGNOSIS — Z7189 Other specified counseling: Secondary | ICD-10-CM

## 2016-11-23 DIAGNOSIS — Z515 Encounter for palliative care: Secondary | ICD-10-CM

## 2016-11-23 LAB — CBC
HEMATOCRIT: 24 % — AB (ref 36.0–46.0)
Hemoglobin: 8 g/dL — ABNORMAL LOW (ref 12.0–15.0)
MCH: 31.9 pg (ref 26.0–34.0)
MCHC: 33.3 g/dL (ref 30.0–36.0)
MCV: 95.6 fL (ref 78.0–100.0)
Platelets: 59 10*3/uL — ABNORMAL LOW (ref 150–400)
RBC: 2.51 MIL/uL — ABNORMAL LOW (ref 3.87–5.11)
RDW: 19.9 % — AB (ref 11.5–15.5)
WBC: 7.8 10*3/uL (ref 4.0–10.5)

## 2016-11-23 LAB — RENAL FUNCTION PANEL
Albumin: 2.9 g/dL — ABNORMAL LOW (ref 3.5–5.0)
Anion gap: 18 — ABNORMAL HIGH (ref 5–15)
BUN: 73 mg/dL — AB (ref 6–20)
CHLORIDE: 95 mmol/L — AB (ref 101–111)
CO2: 20 mmol/L — AB (ref 22–32)
Calcium: 6.8 mg/dL — ABNORMAL LOW (ref 8.9–10.3)
Creatinine, Ser: 10.72 mg/dL — ABNORMAL HIGH (ref 0.44–1.00)
GFR calc Af Amer: 5 mL/min — ABNORMAL LOW (ref >60)
GFR, EST NON AFRICAN AMERICAN: 4 mL/min — AB (ref >60)
Glucose, Bld: 113 mg/dL — ABNORMAL HIGH (ref 65–99)
POTASSIUM: 3.3 mmol/L — AB (ref 3.5–5.1)
Phosphorus: 7.7 mg/dL — ABNORMAL HIGH (ref 2.5–4.6)
Sodium: 133 mmol/L — ABNORMAL LOW (ref 135–145)

## 2016-11-23 LAB — VITAMIN B12: Vitamin B-12: 2542 pg/mL — ABNORMAL HIGH (ref 180–914)

## 2016-11-23 LAB — IRON AND TIBC
Iron: 31 ug/dL (ref 28–170)
Saturation Ratios: 12 % (ref 10.4–31.8)
TIBC: 262 ug/dL (ref 250–450)
UIBC: 231 ug/dL

## 2016-11-23 LAB — FERRITIN: FERRITIN: 147 ng/mL (ref 11–307)

## 2016-11-23 LAB — AMMONIA: AMMONIA: 67 umol/L — AB (ref 9–35)

## 2016-11-23 LAB — FOLATE: Folate: 5.6 ng/mL — ABNORMAL LOW (ref >5.9)

## 2016-11-23 LAB — LIPASE, BLOOD: LIPASE: 178 U/L — AB (ref 11–51)

## 2016-11-23 MED ORDER — BARIUM SULFATE 2.1 % PO SUSP
ORAL | Status: AC
  Filled 2016-11-23: qty 2

## 2016-11-23 MED ORDER — IOPAMIDOL (ISOVUE-300) INJECTION 61%
INTRAVENOUS | Status: AC
  Filled 2016-11-23: qty 30

## 2016-11-23 NOTE — Progress Notes (Signed)
Subjective: Interval History: Patient is feeling much better. She doesn't have any nausea or vomiting.  Objective: Vital signs in last 24 hours: Temp:  [98.1 F (36.7 C)-99 F (37.2 C)] 98.1 F (36.7 C) (04/16 0400) Pulse Rate:  [75-91] 76 (04/16 0600) Resp:  [10-18] 11 (04/16 0600) BP: (86-140)/(46-71) 93/47 (04/16 0600) SpO2:  [96 %-100 %] 97 % (04/16 0600) Weight:  [75.7 kg (166 lb 14.2 oz)] 75.7 kg (166 lb 14.2 oz) (04/15 1045) Weight change: -2.1 kg (-4 lb 10.1 oz)  Intake/Output from previous day: 04/15 0701 - 04/16 0700 In: 1200 [P.O.:1200] Out: 3000  Intake/Output this shift: No intake/output data recorded.  General appearance: alert, cooperative and no distress Resp: diminished breath sounds bilaterally Cardio: regular rate and rhythm Extremities: edema 2-3+ edema  Lab Results:  Recent Labs  11/21/16 1128 11/22/16 0446  WBC 7.7 7.3  HGB 8.7* 7.9*  HCT 25.2* 24.4*  PLT 110* 76*   BMET:   Recent Labs  11/21/16 1128 11/22/16 0446  NA 129* 134*  K 5.3* 4.3  CL 96* 99*  CO2 8* 12*  GLUCOSE 111* 99  BUN 191* 147*  CREATININE 21.90* 16.55*  CALCIUM 5.6* 5.6*   No results for input(s): PTH in the last 72 hours. Iron Studies: No results for input(s): IRON, TIBC, TRANSFERRIN, FERRITIN in the last 72 hours.  Studies/Results: Dg Chest Port 1 View  Result Date: 11/22/2016 CLINICAL DATA:  Pulmonary edema. EXAM: PORTABLE CHEST 1 VIEW COMPARISON:  Acute abdominal series 11/20/2016 FINDINGS: Heart is mildly enlarged. Interstitial edema is improved. Previously-seen upper lobe airspace consolidation is improved. The left base is not imaged. Atherosclerotic calcifications are present at the aortic arch. IMPRESSION: 1. Cardiomegaly with improving interstitial edema. Electronically Signed   By: San Morelle M.D.   On: 11/22/2016 10:30    I have reviewed the patient's current medications.  Assessment/Plan: Problem #1 anasarca: Status post hemodialysis  yesterday And able to remove all 3 literss. Presently patient is feeling better. Still she has significant anasarca. Problem #2 end-stage renal disease/uremia. Patient was dialyzed for the last 2 days. Presently her appetite is improving. She didn't have any nausea or vomiting. Problem #3 metabolic acidosis: Her CO2 is 12 improving Problem #4 metabolic bone disease: Calcium is very low however her phosphorus is very high. Patient presently on a binder. Her calcium phosphorus is pending from today. Problem #5 anemia: She is status post blood transfusion. Her hemoglobin remains low. This is secondary to GI bleeding and possibly also secondary to chronic renal failure. Presently patient is on Epogen. Problem #6 liver cirrhosis Plan: 1] patient does require dialysis today 2] will dialyze her for 4 hours tomorrow. We'll use albumin and low dialysate temperature and possibly remove part 3 L if her blood pressure tolerates. 3] will check her renal panel and CBC in the morning    LOS: 3 days   Coye Dawood S 11/23/2016,7:32 AM

## 2016-11-23 NOTE — Progress Notes (Signed)
Complained of back and leg pain after getting bath. Gave 2 mg of morphine for pain

## 2016-11-23 NOTE — Progress Notes (Signed)
PROGRESS NOTE    Wanda [name]  OZD:[id number] DOB: 1975-12-22 DOA: 11/20/2016 PCP: [name], MD    Brief Narrative: Wanda [name] is a 41 y.o. female with a history of ESRD on dialysis, NASH with cirrhosis and ascites, depression, bipolar, depression, chronic pain, h/o esophageal varices with bleeding, presents with abdominal pain since 3 weeks, and sob , last HD was 3 weeks ago. On arrival to she was found to be in pulmonary edema. She was admitted for further evaluation. Nephrology consulted and recommendations given.   Assessment & Plan:   Principal Problem:   Abdominal pain Active Problems:   Liver cirrhosis secondary to nonalcoholic steatohepatitis (NASH) (HCC)   ESRD on dialysis (HCC)   Ascites of liver   GI bleed   Acute blood loss anemia   Pulmonary edema   Hypocalcemia   Non compliance to hd, ESRD on HD:  Last HD was 3 weeks ago, nephrology consulted and plan for HD today and tomorrow. Improving CXR findings of interstitial edema. She reports breathing better.   Pulmonary Edema:  Suspect from non compliance to medications and HD.  Should improve with HD and monitor. Repeat CXR show improving interstitial edema.  Not requiring oxygen at this time.   Abdominal pain: no change. Still very tender.  Secondary to ascites and mild pancreatitis with elevated lipase.  US paracentesis ordered, but she has minimal ascites,. Pending labs this am.  CT abdomen and pelvis  ordered as her pain didn't improve.   NASH with cirrhosis:  Start patient on lactulose, and repeat ammonia levels no improvement. Rifaximin added in addition to lactulose, 2 BM so far.     Acute encephalopathy from elevated ammonia levels.  Monitor. Improving.    Anemia: suspect anemia of chronic disease and possibly anemia of blood loss. Stool for occult blood is positive. Pt has h/o  of variceal bleeding. Currently she denies any hematochezia or hematemesis. Anemia panel sent and 2 units of  prbc transfusion ordered.  Repeat hemoglobin dropped to 7.9, please transfuse tok eep hemoglobin greater than 7. Repeat labs this am are pending.    Hypocalcemia: replaced. Repeat calcium level pending.   Hyperkalemia and hyponatremia possibly from ESRD.  Hyperkalemia resolved. Hyponatremia improving.    In view of the multiple co morbidities and non compliance to medications and HD, will request palliative care consult for goals of care and symptom management.     DVT prophylaxis: LOVENOX. ) Code Status: (Full) Family Communication: NONE AT BEDSIDE.  Disposition Plan: pending further evaluation.    Consultants:   Nephrology.    Procedures: HD   Antimicrobials: none.    Subjective: Alert, requesting for pain meds.   Objective: Vitals:   11/23/16 0900 11/23/16 1000 11/23/16 1100 11/23/16 1200  BP:      Pulse: 88 85 79 90  Resp: 13 13 12 17   Temp:    98.3 F (36.8 C)  TempSrc:    Oral  SpO2: 100% 100% 95% 98%  Weight:      Height:        Intake/Output Summary (Last 24 hours) at 11/23/16 1416 Last data filed at 11/23/16 0300  Gross per 24 hour  Intake              720 ml  Output                0 ml  Net              720 ml  Filed Weights   11/21/16 1315 11/22/16 0500 11/22/16 1045  Weight: 77.8 kg (171 lb 8.3 oz) 75.6 kg (166 lb 10.7 oz) 75.7 kg (166 lb 14.2 oz)    Examination:  General exam: Appears in mild distress from abd distention Respiratory system: diminished at bases. No wheezing.  Cardiovascular system: S1 & S2 heard, RRR. No JVD, murmurs, rubs, gallops or clicks. Gastrointestinal system: Abdomen is distended, tender to palpation, generalized. With umbilical hernia. Normal bowel sounds heard. Central nervous system: Alert and oriented. No focal neurological deficits. Extremities: Symmetric 5 x 5 power. Skin: No rashes, lesions or ulcers     Data Reviewed: I have personally reviewed following labs and imaging studies  CBC:  Recent  Labs Lab 11/20/16 1519 11/21/16 1128 11/22/16 0446  WBC 9.5 7.7 7.3  NEUTROABS 8.6*  --   --   HGB 6.8* 8.7* 7.9*  HCT 19.8* 25.2* 24.4*  MCV 98.0 93.0 94.9  PLT 138* 110* 76*   Basic Metabolic Panel:  Recent Labs Lab 11/20/16 1519 11/20/16 1536 11/20/16 1702 11/21/16 1128 11/22/16 0446  NA 132*  --   --  129* 134*  K 5.2*  --   --  5.3* 4.3  CL 97*  --   --  96* 99*  CO2 8*  --   --  8* 12*  GLUCOSE 138*  --   --  111* 99  BUN 150*  --   --  191* 147*  CREATININE 21.99*  --   --  21.90* 16.55*  CALCIUM 5.6*  --   --  5.6* 5.6*  MG  --  2.7*  --  2.7*  --   PHOS  --   --  18.5* 18.1* 13.7*   GFR: Estimated Creatinine Clearance: 4.6 mL/min (A) (by C-G formula based on SCr of 16.55 mg/dL (H)). Liver Function Tests:  Recent Labs Lab 11/20/16 1519 11/22/16 0446  AST 38  --   ALT 22  --   ALKPHOS 112  --   BILITOT 0.8  --   PROT 6.5  --   ALBUMIN 2.8* 2.6*    Recent Labs Lab 11/20/16 1519  LIPASE 193*    Recent Labs Lab 11/20/16 1519 11/21/16 1128  AMMONIA 66* 69*   Coagulation Profile:  Recent Labs Lab 11/20/16 1652  INR 1.22   Cardiac Enzymes: No results for input(s): CKTOTAL, CKMB, CKMBINDEX, TROPONINI in the last 168 hours. BNP (last 3 results) No results for input(s): PROBNP in the last 8760 hours. HbA1C: No results for input(s): HGBA1C in the last 72 hours. CBG: No results for input(s): GLUCAP in the last 168 hours. Lipid Profile: No results for input(s): CHOL, HDL, LDLCALC, TRIG, CHOLHDL, LDLDIRECT in the last 72 hours. Thyroid Function Tests: No results for input(s): TSH, T4TOTAL, FREET4, T3FREE, THYROIDAB in the last 72 hours. Anemia Panel:  Recent Labs  11/21/16 1128  RETICCTPCT 5.9*   Sepsis Labs: No results for input(s): PROCALCITON, LATICACIDVEN in the last 168 hours.  Recent Results (from the past 240 hour(s))  MRSA PCR Screening     Status: None   Collection Time: 11/20/16  9:45 PM  Result Value Ref Range Status    MRSA by PCR NEGATIVE NEGATIVE Final    Comment:        The GeneXpert MRSA Assay (FDA approved for NASAL specimens only), is one component of a comprehensive MRSA colonization surveillance program. It is not intended to diagnose MRSA infection nor to guide or monitor treatment for MRSA  infections.          Radiology Studies: US Abdomen Limited  Result Date: 11/23/2016 CLINICAL DATA:  Ascites.  Evaluate for paracentesis. EXAM: LIMITED ABDOMEN ULTRASOUND FOR ASCITES TECHNIQUE: Limited ultrasound survey for ascites was performed in all four abdominal quadrants. COMPARISON:  Abdominal series 413 2018.  Ultrasound 10/30/2016. FINDINGS: Small pockets of ascites noted in both lower quadrants. Ascites volume not enough for safe paracentesis. Paracentesis not performed at this time . IMPRESSION: Small amount of ascites.  Paracentesis not performed at this time. Electronically Signed   By: Marcello Moores  Register   On: 11/23/2016 09:50   Dg Chest Port 1 View  Result Date: 11/22/2016 CLINICAL DATA:  Pulmonary edema. EXAM: PORTABLE CHEST 1 VIEW COMPARISON:  Acute abdominal series 11/20/2016 FINDINGS: Heart is mildly enlarged. Interstitial edema is improved. Previously-seen upper lobe airspace consolidation is improved. The left base is not imaged. Atherosclerotic calcifications are present at the aortic arch. IMPRESSION: 1. Cardiomegaly with improving interstitial edema. Electronically Signed   By: San Morelle M.D.   On: 11/22/2016 10:30        Scheduled Meds: . sodium chloride   Intravenous Once  . calcium acetate  1,334 mg Oral With snacks  . calcium acetate  2,668 mg Oral TID WC  . iopamidol      . lactulose  30 g Oral TID  . midodrine  10 mg Oral Q T,Th,Sa-HD  . pantoprazole (PROTONIX) IV  40 mg Intravenous Q12H  . rifaximin  550 mg Oral BID  . sucralfate  1 g Oral TID WC & HS   Continuous Infusions:   LOS: 3 days    Time spent: *35 minutes.     Hosie Poisson, MD [facility] Pager [phone]  If 7PM-7AM, please contact night-coverage [email or web address] Password [email or web address] 11/23/2016, 2:16 PM

## 2016-11-23 NOTE — Progress Notes (Signed)
Patient refuses lab draws, and states that she wants her blood work performed during dialysis. Pt educated and explained the benefits of timely lab work and routine blood draws for the progression of her hospital course. Pt remains firm she wants blood drawn during dialysis.

## 2016-11-23 NOTE — Progress Notes (Signed)
Called report to [facility] and transported patient to room 304.

## 2016-11-23 NOTE — Consult Note (Signed)
Consultation Note Date: 11/23/2016   Patient Name: Wanda [name]  DOB: 1975-10-07  MRN: [id number]  Age / Sex: 41 y.o., female  PCP: [name], MD Referring Physician: Hosie Poisson, MD  Reason for Consultation: Establishing goals of care and Psychosocial/spiritual support  HPI/Patient Profile: 41 y.o. female  with past medical history of Anxiety and depression, bipolar disorder, NASH, liver cirrhosis, history is C. difficile colitis, pneumonia, spontaneous bacterial peritonitis, ESRD on dialysis, bleeding esophageal varices banded in 2015, admitted on 11/20/2016 with abdominal pain possibly secondary to ascites, end-stage renal disease on dialysis, but she has missed the last several weeks of dialysis.   Clinical Assessment and Goals of Care: Wanda [name] is lying quietly in bed. She will make do not keep eye contact. She has a flat affect. She is calm and cooperative, but does not offer information without being asked. There is no family at bedside today.  We talk about her health, I share my worry over 5 hospitalizations in the last 6 months. I share with Wanda [name] we believe that she is the very ill. Wanda [name] asks why she needs a CT. I share that due to her abdomen pain, we need to take a look, find out if there's something that were missing.  We talk about healthcare power of attorney in detail. Wanda [name] states that she is not ready to complete any paperwork at this time, but her significant other/fianc, Wanda [name] will arrive later today. He is working at [employer]. I share that legally, her son Wanda [name] would be her decision maker, but we can help her complete paperwork to name someone else if she so desires. I share that it is important that the person she names knows what she does, and does not want.   Healthcare power of attorney NEXT OF KIN - we talk about healthcare power of  attorney today, Wanda [name] has not named anyone to make decisions that she is unable. I share with her that legally, her son, Wanda [name] would make decisions for her. I share that it she can complete simple paperwork here in the hospital if she wishes to name anyone else.   SUMMARY OF RECOMMENDATIONS   continue to treat the treatable, continue code status discussions, continue end-of-life discussions.  Code Status/Advance Care Planning:  Full code - we do not discuss code status today. Relationship building first.   Symptom Management:   per hospitalist, no additional needs at this time.  Palliative Prophylaxis:   Frequent Pain Assessment and Turn Reposition  Additional Recommendations (Limitations, Scope, Preferences):  Full Scope Treatment  Psycho-social/Spiritual:   Desire for further Chaplaincy support:no  Additional Recommendations: Caregiving  Support/Resources  Prognosis:   < 6 months, would not be surprising based on 5 hospitalizations in the last 6 months, missing 3 weeks of dialysis, frailty.   Discharge Planning: To Be Determined      Primary Diagnoses: Present on Admission: . Abdominal pain . Ascites of liver . Liver cirrhosis secondary to nonalcoholic steatohepatitis (NASH) ([place]) .  Pulmonary edema . Acute blood loss anemia . Hypocalcemia . GI bleed   I have reviewed the medical record, interviewed the patient and family, and examined the patient. The following aspects are pertinent.  Past Medical History:  Diagnosis Date  . Acute blood loss anemia 02/25/2014   Status post transfusion  . Acute renal failure ([place]) 09/2013   Pre-renal- resolved  . Anasarca 10/10/2013  . Anxiety   . Bipolar disorder ([place]) 12/04/2013   2007-SEEN IN ED FOR INVOLUNTARY COMMITMENT, UDS POS FOR AMPHETAMINES/OPIATES   . Bleeding esophageal varices ([place]) 02/28/2014   s/p banding  . C. difficile colitis 04/19/2014  . Chronic hypotension   . Cirrhosis ([place]) 10/05/13   Liver  bx 11/23/13 (delayed initially due to patient refusal). c/w steatohepatitis  . Cirrhosis of liver with ascites ([place])   . Depression   . ESRD (end stage renal disease) on dialysis ([place]) 08/2014  . Folate deficiency 09/2013  . Gastroesophageal junction ulcer 09/17/2014  . GERD (gastroesophageal reflux disease)   . Hematemesis/vomiting blood 02/24/2014  . Macrocytosis 02/28/2014  . PNA (pneumonia) 10/13/2013  . SBP (spontaneous bacterial peritonitis) ([place]) 11/10/2013  . Thrombocytopenia ([place])    Hypercellular bone marrow; abundant megakaryocytes per 08/27/2014; s/p bone marrow bx   Social History   Social History  . Marital status: Single    Spouse name: N/A  . Number of children: 1  . Years of education: N/A   Occupational History  . unemployed    Social History Main Topics  . Smoking status: Former Smoker    Packs/day: 0.25    Years: 20.00    Types: Cigarettes    Quit date: 11/05/2008  . Smokeless tobacco: Never Used     Comment: Never really smoked much  . Alcohol use No  . Drug use: No  . Sexual activity: Not Currently    Partners: Male    Birth control/ protection: None   Other Topics Concern  . None   Social History Narrative  . None   Family History  Problem Relation Age of Onset  . Heart disease Mother   . Colon cancer Neg Hx   . Liver disease Neg Hx    Scheduled Meds: . sodium chloride   Intravenous Once  . Barium Sulfate      . calcium acetate  1,334 mg Oral With snacks  . calcium acetate  2,668 mg Oral TID WC  . iopamidol      . lactulose  30 g Oral TID  . midodrine  10 mg Oral Q T,Th,Sa-HD  . pantoprazole (PROTONIX) IV  40 mg Intravenous Q12H  . rifaximin  550 mg Oral BID  . sucralfate  1 g Oral TID WC & HS   Continuous Infusions: PRN Meds:.sodium chloride, sodium chloride, lidocaine (PF), lidocaine-prilocaine, LORazepam, morphine injection, ondansetron (ZOFRAN) IV, pentafluoroprop-tetrafluoroeth Medications Prior to Admission:  Prior to Admission  medications   Medication Sig Start Date End Date Taking? Authorizing Provider  clotrimazole (LOTRIMIN) 1 % cream Apply 1 application topically 3 (three) times daily. 09/30/16  Yes Danie Binder, MD  Darbepoetin Alfa (ARANESP) 60 MCG/0.3ML SOSY injection Inject 0.3 mLs (60 mcg total) into the vein every Thursday with hemodialysis. Patient taking differently: Inject 60 mcg into the vein every Tuesday with hemodialysis.  06/09/15  Yes Cherene Altes, MD  lactulose (CHRONULAC) 10 GM/15ML solution Take 45 mLs (30 g total) by mouth 3 (three) times daily. TAKE 15-30 ml BY MOUTH three times a day Patient taking differently:  Take 10-20 g by mouth 3 (three) times daily. TAKE 15-30 ml BY MOUTH three times a day 09/27/16  Yes Cherene Altes, MD  lidocaine (XYLOCAINE) 2 % solution TAKE TWO TEASPOONSFUL (10ML) BY MOUTH BEFORE MEALS AND AT BEDTIME TO PREVENT CHEST PAIN WHILE EATING 03/19/16  Yes Carlis Stable, NP  lidocaine-prilocaine (EMLA) cream Apply 1 application topically as needed (for dialysis treatments).   Yes Historical Provider, MD  LORazepam (ATIVAN) 1 MG tablet Take 1 mg by mouth 2 (two) times daily as needed for anxiety (and/or back spasms).   Yes Historical Provider, MD  midodrine (PROAMATINE) 10 MG tablet Take 1 tablet (10 mg total) by mouth every morning. Patient taking differently: Take 10 mg by mouth See admin instructions. Take ONLY on dialysis days 09/28/16  Yes Cherene Altes, MD  omeprazole (PRILOSEC) 20 MG capsule 1 po every morning 30 minutes prior to your first meal. Patient taking differently: Take 20 mg by mouth daily before breakfast. 1 po every morning 30 minutes prior to your first meal. 09/30/16  Yes Danie Binder, MD  Oxycodone HCl 10 MG TABS Take 0.5 tablets (5 mg total) by mouth 3 (three) times daily as needed. 1 PO TID AS NEEDED FOR PAIN Patient taking differently: Take 5-10 mg by mouth 3 (three) times daily as needed.  09/27/16  Yes Cherene Altes, MD  RENVELA 800 MG  tablet Take 1600 mg by mouth 3 times daily with meals. Take 800 mg by mouth with snacks. 10/29/15  Yes Historical Provider, MD  sucralfate (CARAFATE) 1 GM/10ML suspension Take 1 g by mouth 4 (four) times daily -  with meals and at bedtime.   Yes Historical Provider, MD   Allergies  Allergen Reactions  . Iohexol Rash    Pt states rash after last CT. Pt needs pre-meds  . Lasix [Furosemide] Other (See Comments)    "doesn't work"  . Latex Itching   Review of Systems  Unable to perform ROS: Acuity of condition    Physical Exam  Constitutional: She is oriented to person, place, and time. No distress.  Makes but does not keep eye contact, calm, cooperative  HENT:  Head: Normocephalic and atraumatic.  Cardiovascular: Normal rate and regular rhythm.   Pulmonary/Chest: Effort normal. No respiratory distress.  Abdominal: She exhibits distension.  Abdomen tight, distended  Musculoskeletal: She exhibits no edema.  Neurological: She is alert and oriented to person, place, and time.  Skin: Skin is warm and dry.  Jaundiced  Nursing note and vitals reviewed.   Vital Signs: BP (!) 97/48   Pulse 90   Temp 98.3 F (36.8 C) (Oral)   Resp 17   Ht 5\' 6"  (1.676 m)   Wt 75.7 kg (166 lb 14.2 oz)   LMP 09/21/2013   SpO2 98%   BMI 26.94 kg/m  Pain Assessment: 0-10 POSS *See Group Information*: 1-Acceptable,Awake and alert Pain Score: 4    SpO2: SpO2: 98 % O2 Device:SpO2: 98 % O2 Flow Rate: .   IO: Intake/output summary:  Intake/Output Summary (Last 24 hours) at 11/23/16 1520 Last data filed at 11/23/16 0300  Gross per 24 hour  Intake              720 ml  Output                0 ml  Net              720 ml    LBM: Last BM  Date: 11/22/16 Baseline Weight: Weight: 66.2 kg (146 lb) Most recent weight: Weight: 75.7 kg (166 lb 14.2 oz)     Palliative Assessment/Data:   Flowsheet Rows     Most Recent Value  Intake Tab  Referral Department  Hospitalist  Unit at Time of Referral  ICU    Palliative Care Primary Diagnosis  Nephrology  Date Notified  11/23/16  Palliative Care Type  New Palliative care  Reason for referral  Advance Care Planning, Clarify Goals of Care  Date of Admission  11/20/16  Date first seen by Palliative Care  11/23/16  # of days Palliative referral response time  0 Day(s)  # of days IP prior to Palliative referral  3  Clinical Assessment  Palliative Performance Scale Score  40%  Pain Max last 24 hours  Not able to report  Pain Min Last 24 hours  Not able to report  Dyspnea Max Last 24 Hours  Not able to report  Dyspnea Min Last 24 hours  Not able to report  Psychosocial & Spiritual Assessment  Palliative Care Outcomes  Patient/Family meeting held?  Yes  Who was at the meeting?  Patient only today  Palliative Care Outcomes  Provided psychosocial or spiritual support, Provided advance care planning      Time In: 7482 Time Out: 1450 Time Total: 30 minutes Greater than 50%  of this time was spent counseling and coordinating care related to the above assessment and plan.  Signed by: Drue Novel, NP   Please contact Palliative Medicine Team phone at [phone] for questions and concerns.  For individual provider: See Shea Evans

## 2016-11-24 ENCOUNTER — Encounter (HOSPITAL_COMMUNITY): Payer: Self-pay | Admitting: Gastroenterology

## 2016-11-24 DIAGNOSIS — D649 Anemia, unspecified: Secondary | ICD-10-CM

## 2016-11-24 DIAGNOSIS — K7469 Other cirrhosis of liver: Secondary | ICD-10-CM

## 2016-11-24 DIAGNOSIS — R1084 Generalized abdominal pain: Secondary | ICD-10-CM

## 2016-11-24 DIAGNOSIS — Z992 Dependence on renal dialysis: Secondary | ICD-10-CM

## 2016-11-24 DIAGNOSIS — K7581 Nonalcoholic steatohepatitis (NASH): Secondary | ICD-10-CM

## 2016-11-24 DIAGNOSIS — N186 End stage renal disease: Secondary | ICD-10-CM

## 2016-11-24 DIAGNOSIS — K7460 Unspecified cirrhosis of liver: Secondary | ICD-10-CM

## 2016-11-24 DIAGNOSIS — R140 Abdominal distension (gaseous): Secondary | ICD-10-CM

## 2016-11-24 MED ORDER — LACTULOSE 10 GM/15ML PO SOLN
10.0000 g | Freq: Three times a day (TID) | ORAL | Status: DC
  Administered 2016-11-25 (×2): 10 g via ORAL
  Filled 2016-11-24 (×5): qty 30

## 2016-11-24 MED ORDER — OXYCODONE HCL 5 MG PO TABS
10.0000 mg | ORAL_TABLET | Freq: Three times a day (TID) | ORAL | Status: DC
  Administered 2016-11-24 – 2016-11-26 (×6): 10 mg via ORAL
  Filled 2016-11-24 (×7): qty 2

## 2016-11-24 MED ORDER — PANTOPRAZOLE SODIUM 40 MG PO TBEC
40.0000 mg | DELAYED_RELEASE_TABLET | Freq: Two times a day (BID) | ORAL | Status: DC
  Administered 2016-11-24 – 2016-11-25 (×2): 40 mg via ORAL
  Filled 2016-11-24 (×2): qty 1

## 2016-11-24 MED ORDER — WHITE PETROLATUM GEL
Status: DC | PRN
  Filled 2016-11-24: qty 28.35

## 2016-11-24 MED ORDER — LACTULOSE 10 GM/15ML PO SOLN: 10.0000 g | Freq: Two times a day (BID) | ORAL | Status: DC | PRN

## 2016-11-24 MED ORDER — PIPERACILLIN-TAZOBACTAM 3.375 G IVPB
3.3750 g | Freq: Two times a day (BID) | INTRAVENOUS | Status: DC
  Administered 2016-11-24: 3.375 g via INTRAVENOUS
  Filled 2016-11-24: qty 50

## 2016-11-24 MED ORDER — ALBUMIN HUMAN 25 % IV SOLN
50.0000 g | Freq: Once | INTRAVENOUS | Status: AC
  Administered 2016-11-24: 50 g via INTRAVENOUS
  Filled 2016-11-24: qty 200

## 2016-11-24 MED ORDER — ALBUMIN HUMAN 25 % IV SOLN
INTRAVENOUS | Status: AC
  Administered 2016-11-24: 50 g via INTRAVENOUS
  Filled 2016-11-24: qty 100

## 2016-11-24 MED ORDER — FOLIC ACID 1 MG PO TABS
1.0000 mg | ORAL_TABLET | Freq: Every day | ORAL | Status: DC
  Filled 2016-11-24 (×2): qty 1

## 2016-11-24 NOTE — Procedures (Signed)
   HEMODIALYSIS TREATMENT NOTE:  4 hour heparin-free dialysis completed via right upper arm AVF (16g/antegrade). Goal nearly met: 2.8 L removed with administration of Albumin 50g at start of HD.  All blood was returned and hemostasis was achieved within 10 minutes. Zosyn was given at end of HD due to dialyzability of drug. Report given to Tawnya Crook, RN.  Rockwell Alexandria, RN, CDN

## 2016-11-24 NOTE — Progress Notes (Signed)
PROGRESS NOTE    [name]  DXA:[id number] DOB: 1976-04-10 DOA: 11/20/2016 PCP: [name], MD    Brief Narrative: Wanda [name] is a 41 y.o. female with a history of ESRD on dialysis, NASH with cirrhosis and ascites, depression, bipolar, depression, chronic pain, h/o esophageal varices with bleeding, presents with abdominal pain since 3 weeks, and sob , last HD was 3 weeks ago. On arrival to she was found to be in pulmonary edema. She was admitted for further evaluation. Nephrology consulted and recommendations given.   Assessment & Plan:   Principal Problem:   Abdominal pain Active Problems:   Liver cirrhosis secondary to nonalcoholic steatohepatitis (NASH) ([facility])   End stage renal disease on dialysis ([facility])   Ascites of liver   GI bleed   Acute blood loss anemia   Pulmonary edema   Hypocalcemia   Palliative care encounter   Liver cirrhosis secondary to NASH (nonalcoholic steatohepatitis) ([facility])   Non compliance to HD, ESRD on HD:  Last HD was 3 weeks ago, nephrology consulted and plan for HD today and tomorrow. Improving CXR findings of interstitial edema. She reports breathing better. She was transitioned off nasal canula oxygen.   Pulmonary Edema:  Suspect from non compliance to medications and HD.  Should improve with HD and monitor. Repeat CXR show improving interstitial edema.  Not requiring oxygen at this time.   Abdominal pain: no change. Still very tender.  Secondary to ascites and mild pancreatitis with elevated lipase. She was started on clear liquid diet  Advanced to soft diet.  US paracentesis ordered, but couldn't be done. CT abdomen and pelvis  ordered as her pain didn't improve, showed Vague wall thickening along the ascending colon, concerning for infectious or inflammatory colitis and large volume ascites.  Started her on IV zosyn.  GI consulted and recommendations given.    NASH with cirrhosis:  Start patient on lactulose, and repeat ammonia  levels no improvement. Rifaximin added in addition to lactulose, .     Acute encephalopathy from elevated ammonia levels.  Monitor. Improving.    Anemia: suspect anemia of chronic disease and possibly anemia of blood loss. Stool for occult blood is positive. Pt has h/o  of variceal bleeding. Currently she denies any hematochezia or hematemesis. Anemia panel sent and 2 units of prbc transfusion ordered.  Repeat hemoglobin dropped to 7.9, please transfuse tok eep hemoglobin greater than 7. Repeat labs show stable hemoglobin around 8. Anemia panel showed low folate.    Hypocalcemia: replaced. Improving calcium level.     Hyperkalemia and hyponatremia possibly from ESRD.  Hyperkalemia resolved. Hyponatremia improving.    Hyperphosphatemia:  Binders ordered and phos level improved.    In view of the multiple co morbidities and non compliance to medications and HD, will request palliative care consult for goals of care and symptom management.     DVT prophylaxis: LOVENOX. ) Code Status: (Full) Family Communication: NONE AT BEDSIDE.  Disposition Plan: pending further evaluation.    Consultants:   Nephrology.    Procedures: HD   Antimicrobials: none.    Subjective: Alert, requesting for pain meds.   Objective: Vitals:   11/24/16 1400 11/24/16 1415 11/24/16 1430 11/24/16 1445  BP: (!) 99/50 (!) 86/43 (!) 106/45 (!) 88/42  Pulse: 81 84 88 81  Resp:      Temp:      TempSrc:      SpO2:      Weight:      Height:  Intake/Output Summary (Last 24 hours) at 11/24/16 1452 Last data filed at 11/24/16 1445  Gross per 24 hour  Intake              360 ml  Output             3000 ml  Net            -2640 ml   Filed Weights   11/22/16 1045 11/24/16 0453 11/24/16 1040  Weight: 75.7 kg (166 lb 14.2 oz) 75.6 kg (166 lb 10.7 oz) 75.6 kg (166 lb 10.7 oz)    Examination:  General exam: Appears in mild distress from abd distention Respiratory system: diminished at  bases. No wheezing.  Cardiovascular system: S1 & S2 heard, RRR. No JVD, murmurs, rubs, gallops or clicks. Gastrointestinal system: Abdomen is distended, tender to palpation, generalized. With umbilical hernia. Normal bowel sounds heard. Central nervous system: Alert and oriented. No focal neurological deficits. Extremities: 3  + edema. Skin: No rashes, lesions or ulcers     Data Reviewed: I have personally reviewed following labs and imaging studies  CBC:  Recent Labs Lab 11/20/16 1519 11/21/16 1128 11/22/16 0446 11/23/16 1418  WBC 9.5 7.7 7.3 7.8  NEUTROABS 8.6*  --   --   --   HGB 6.8* 8.7* 7.9* 8.0*  HCT 19.8* 25.2* 24.4* 24.0*  MCV 98.0 93.0 94.9 95.6  PLT 138* 110* 76* 59*   Basic Metabolic Panel:  Recent Labs Lab 11/20/16 1519 11/20/16 1536 11/20/16 1702 11/21/16 1128 11/22/16 0446 11/23/16 1418  NA 132*  --   --  129* 134* 133*  K 5.2*  --   --  5.3* 4.3 3.3*  CL 97*  --   --  96* 99* 95*  CO2 8*  --   --  8* 12* 20*  GLUCOSE 138*  --   --  111* 99 113*  BUN 150*  --   --  191* 147* 73*  CREATININE 21.99*  --   --  21.90* 16.55* 10.72*  CALCIUM 5.6*  --   --  5.6* 5.6* 6.8*  MG  --  2.7*  --  2.7*  --   --   PHOS  --   --  18.5* 18.1* 13.7* 7.7*   GFR: Estimated Creatinine Clearance: 7.1 mL/min (A) (by C-G formula based on SCr of 10.72 mg/dL (H)). Liver Function Tests:  Recent Labs Lab 11/20/16 1519 11/22/16 0446 11/23/16 1418  AST 38  --   --   ALT 22  --   --   ALKPHOS 112  --   --   BILITOT 0.8  --   --   PROT 6.5  --   --   ALBUMIN 2.8* 2.6* 2.9*    Recent Labs Lab 11/20/16 1519 11/23/16 1418  LIPASE 193* 178*    Recent Labs Lab 11/20/16 1519 11/21/16 1128 11/23/16 1418  AMMONIA 66* 69* 67*   Coagulation Profile:  Recent Labs Lab 11/20/16 1652  INR 1.22   Cardiac Enzymes: No results for input(s): CKTOTAL, CKMB, CKMBINDEX, TROPONINI in the last 168 hours. BNP (last 3 results) No results for input(s): PROBNP in the last  8760 hours. HbA1C: No results for input(s): HGBA1C in the last 72 hours. CBG: No results for input(s): GLUCAP in the last 168 hours. Lipid Profile: No results for input(s): CHOL, HDL, LDLCALC, TRIG, CHOLHDL, LDLDIRECT in the last 72 hours. Thyroid Function Tests: No results for input(s): TSH, T4TOTAL, FREET4, T3FREE, THYROIDAB in the  last 72 hours. Anemia Panel:  Recent Labs  11/22/16 0446 11/22/16 1452  VITAMINB12  --  2,542*  FOLATE 5.6*  --   FERRITIN  --  147  TIBC  --  262  IRON  --  31   Sepsis Labs: No results for input(s): PROCALCITON, LATICACIDVEN in the last 168 hours.  Recent Results (from the past 240 hour(s))  MRSA PCR Screening     Status: None   Collection Time: 11/20/16  9:45 PM  Result Value Ref Range Status   MRSA by PCR NEGATIVE NEGATIVE Final    Comment:        The GeneXpert MRSA Assay (FDA approved for NASAL specimens only), is one component of a comprehensive MRSA colonization surveillance program. It is not intended to diagnose MRSA infection nor to guide or monitor treatment for MRSA infections.          Radiology Studies: Ct Abdomen Pelvis Wo Contrast  Result Date: 11/23/2016 CLINICAL DATA:  Acute onset of worsening abdominal distention. Initial encounter. EXAM: CT ABDOMEN AND PELVIS WITHOUT CONTRAST TECHNIQUE: Multidetector CT imaging of the abdomen and pelvis was performed following the standard protocol without IV contrast. COMPARISON:  CT of the abdomen and pelvis performed 10/30/2016 FINDINGS: Lower chest: Trace bilateral pleural fluid is noted. Diffuse coronary artery calcifications are seen. Hepatobiliary: The nodular contour of the liver is compatible with hepatic cirrhosis. No definite mass is characterized. Stones are seen within the gallbladder. The gallbladder is otherwise grossly unremarkable. The common bile duct remains normal in caliber. 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 Pancreas: The pancreas is  within normal limits. Spleen: The spleen is unremarkable in appearance. Adrenals/Urinary Tract: The adrenal glands are unremarkable in appearance. Mild bilateral renal atrophy is noted. There is no evidence of hydronephrosis. No renal or ureteral stones are identified. No perinephric stranding is seen. Stomach/Bowel: The appendix is normal in caliber, without evidence of appendicitis. Vague wall thickening is noted along the ascending colon, concerning for infectious or inflammatory colitis. The remainder of the colon is grossly unremarkable. Contrast is noted within the small bowel. The small bowel is grossly unremarkable in appearance. The stomach is grossly unremarkable in appearance. Vascular/Lymphatic: Scattered calcification is seen along the abdominal aorta and its branches. The abdominal aorta is otherwise grossly unremarkable. The inferior vena cava is grossly unremarkable. No retroperitoneal lymphadenopathy is seen. No pelvic sidewall lymphadenopathy is identified. Reproductive: The bladder is decompressed and not well assessed. The uterus is grossly unremarkable. The ovaries are relatively symmetric. No suspicious adnexal masses are seen. Other: Relatively large volume ascites is seen within the abdomen and pelvis, extending into a moderate umbilical hernia. Diffuse soft tissue edema is seen tracking along the abdominal and pelvic wall. Asymmetric edema and subcutaneous inflammation are noted at the left breast. Musculoskeletal: No acute osseous abnormalities are identified. The visualized musculature is unremarkable in appearance. IMPRESSION: 1. Vague wall thickening along the ascending colon, concerning for infectious or inflammatory colitis. 2. Relatively large volume ascites within the abdomen and pelvis, extending into a moderate umbilical hernia. 3. Diffuse soft tissue edema tracking along the abdominal and pelvic wall. 4. Asymmetric edema and subcutaneous inflammation noted at the left breast. This  could reflect underlying systemic edema, though would correlate clinically to exclude inflammatory cancer of the left breast. 5. Findings of hepatic cirrhosis, with prominent splenic, gastric and right renal varices. 6. Trace bilateral pleural fluid. 7. Diffuse coronary artery calcifications seen. 8. Cholelithiasis.  Gallbladder otherwise unremarkable. 9. Mild  bilateral renal atrophy noted. 10. Scattered aortic atherosclerosis. Electronically Signed   By: Garald Balding M.D.   On: 11/23/2016 18:08   US Abdomen Limited  Result Date: 11/23/2016 CLINICAL DATA:  Ascites.  Evaluate for paracentesis. EXAM: LIMITED ABDOMEN ULTRASOUND FOR ASCITES TECHNIQUE: Limited ultrasound survey for ascites was performed in all four abdominal quadrants. COMPARISON:  Abdominal series 413 2018.  Ultrasound 10/30/2016. FINDINGS: Small pockets of ascites noted in both lower quadrants. Ascites volume not enough for safe paracentesis. Paracentesis not performed at this time . IMPRESSION: Small amount of ascites.  Paracentesis not performed at this time. Electronically Signed   By: Marcello Moores  Register   On: 11/23/2016 09:50        Scheduled Meds: . calcium acetate  1,334 mg Oral With snacks  . calcium acetate  2,668 mg Oral TID WC  . lactulose  30 g Oral TID  . midodrine  10 mg Oral Q T,Th,Sa-HD  . pantoprazole  40 mg Oral BID AC  . rifaximin  550 mg Oral BID  . sucralfate  1 g Oral TID WC & HS   Continuous Infusions: . sodium chloride    . sodium chloride    . sodium chloride    . piperacillin-tazobactam (ZOSYN)  IV 3.375 g (11/24/16 1439)     LOS: 4 days    Time spent: *35 minutes.     Hosie Poisson, MD [facility] Pager [phone]  If 7PM-7AM, please contact night-coverage [email or web address] Password [email or web address] 11/24/2016, 2:52 PM

## 2016-11-24 NOTE — Progress Notes (Signed)
Pharmacy Antibiotic Note  Wanda [name] is a 40 y.o. female admitted on 11/20/2016 with intra abdominal infection.  Pharmacy has been consulted for ZOSYN dosing.  Pt with ESRD requiring dialysis.    Plan: Zosyn 3.375gm IV q12hrs (4 hr infusion) - dialysis dosing Monitor labs, progress, c/s  Height: 5\' 6"  (167.6 cm) Weight: 166 lb 10.7 oz (75.6 kg) IBW/kg (Calculated) : 59.3  Temp (24hrs), Avg:98.1 F (36.7 C), Min:98 F (36.7 C), Max:98.3 F (36.8 C)   Recent Labs Lab 11/20/16 1519 11/21/16 1128 11/22/16 0446 11/23/16 1418  WBC 9.5 7.7 7.3 7.8  CREATININE 21.99* 21.90* 16.55* 10.72*    Estimated Creatinine Clearance: 7.1 mL/min (A) (by C-G formula based on SCr of 10.72 mg/dL (H)).    Allergies  Allergen Reactions  . Iohexol Rash    Pt states rash after last CT. Pt needs pre-meds  . Lasix [Furosemide] Other (See Comments)    "doesn't work"  . Latex Itching   Antimicrobials this admission: ZOSYN 4/17 >>   Dose adjustments this admission:  Microbiology results:  BCx: pending  UCx: pending   Sputum:    MRSA PCR:   Thank you for allowing pharmacy to be a part of this patient's care.  Hart Robinsons A 11/24/2016 9:37 AM

## 2016-11-24 NOTE — Evaluation (Signed)
Physical Therapy Evaluation Patient Details Name: Wanda [name] MRN: [id number] DOB: 1976/06/12 Today's Date: 11/24/2016   History of Present Illness  41 y.o. female with a history of ESRD on dialysis, NASH with cirrhosis and ascites, depression, bipolar, depression, chronic pain, h/o esophageal varices with bleeding. This is the fourth hospitalization over the past 6 months. Patient seen due to abdominal pain which is diffuse, which she was hospitalized for back of the end of March. The patient was released after the abdominal pain had resolved the following day after dialysis. She did have a paracentesis done while at the hospital. Today, the patient and missed a pain it's been increasing over the past several weeks. She is not able to provide much other history of her abdominal pain as she is a poor historian. She does admit to missing approximately 3 weeks of dialysis. She states that the last time she had dialysis was when she was hospitalized 3 weeks ago. She states that she misses her dialysis because she doesn't feel well.    Clinical Impression  Pt received in bed, significant other present, and pt is agreeable to PT evaluation.  Pt states that she is normally independent with ambulation, but occasionally uses the walker.  She requires assistance for dressing and bathing.  During PT evaluation today, she is greatly limited due to pain in back and thighs.  She requires Min A for supine<>sit, and Max A for sit<>stand, and Mod A for sit<>supine.  Pt was unable to weight shift in order to take any steps today.  She has had multiple re-admissions, and she is at high risk for falling due to weakness.  Therefore, she is strongly recommended for SNF, however pt does not seem willing to pursue SNF at this time.  She keeps saying "lets just focus on getting home."      Follow Up Recommendations SNF;Home health PT;Supervision/Assistance - 24 hour    Equipment Recommendations  None recommended by PT    Recommendations for Other Services       Precautions / Restrictions Precautions Precautions: Fall Precaution Comments: Due to immobility Restrictions Weight Bearing Restrictions: No      Mobility  Bed Mobility Overal bed mobility: Needs Assistance Bed Mobility: Supine to Sit;Sit to Supine     Supine to sit: Min assist Sit to supine: Mod assist      Transfers Overall transfer level: Needs assistance Equipment used: Rolling walker (2 wheeled) Transfers: Sit to/from Stand Sit to Stand: Max assist         General transfer comment: Once standing, pt is unable to weight shift to take any steps today.  Poor eccentric control back down when going to sit on the EOB.    Ambulation/Gait                Stairs            Wheelchair Mobility    Modified Rankin (Stroke Patients Only)       Balance Overall balance assessment: Needs assistance Sitting-balance support: Bilateral upper extremity supported;Feet supported Sitting balance-Leahy Scale: Fair Sitting balance - Comments: Posterior lean due to body habitus with increased abdominal girth with ascites.       Standing balance-Leahy Scale: Poor Standing balance comment: Continued posterior lean                             Pertinent Vitals/Pain Pain Assessment: 0-10 Pain Score: 10-Worst pain ever Pain Location:  back and stomach, shoulder blades.   Pain Descriptors / Indicators: Sharp Pain Intervention(s): Limited activity within patient's tolerance;Monitored during session;Repositioned    Home Living   Living Arrangements: Spouse/significant other Available Help at Discharge: Available 24 hours/day Type of Home: House Home Access: Level entry     Home Layout: One level Home Equipment: Walker - standard;Bedside commode      Prior Function Level of Independence: Independent with assistive device(s)   Gait / Transfers Assistance Needed: Pt states she uses the standard walker to get  around with when she needs to  ADL's / Homemaking Assistance Needed: independent mostly, but occasional assistnace from significant other.  Assistance for bathing.    Comments: Does not drive, but when feeling good, she will go with him to run errands.  Pt holds the cart when ambulating through the grocery store.       Hand Dominance   Dominant Hand: Right    Extremity/Trunk Assessment   Upper Extremity Assessment Upper Extremity Assessment: Generalized weakness    Lower Extremity Assessment Lower Extremity Assessment: Generalized weakness       Communication   Communication: No difficulties  Cognition Arousal/Alertness: Awake/alert Behavior During Therapy: WFL for tasks assessed/performed Overall Cognitive Status: Within Functional Limits for tasks assessed                                        General Comments      Exercises     Assessment/Plan    PT Assessment Patient needs continued PT services  PT Problem List Decreased strength;Decreased range of motion;Decreased activity tolerance;Decreased balance;Decreased mobility;Decreased safety awareness;Pain       PT Treatment Interventions DME instruction;Gait training;Functional mobility training;Therapeutic activities;Therapeutic exercise;Balance training;Patient/family education;Neuromuscular re-education    PT Goals (Current goals can be found in the Care Plan section)  Acute Rehab PT Goals Patient Stated Goal: Pt wants to go home PT Goal Formulation: With patient/family Time For Goal Achievement: 12/08/16 Potential to Achieve Goals: Fair    Frequency Min 3X/week   Barriers to discharge        Co-evaluation               End of Session Equipment Utilized During Treatment: Gait belt Activity Tolerance: Patient limited by pain;Patient limited by fatigue Patient left: in bed;with call bell/phone within reach;with nursing/sitter in room Nurse Communication: Mobility status;Need for  lift equipment (mobility sheet left hanging in the room.   Recommend STEDY for transfers. ) PT Visit Diagnosis: Other abnormalities of gait and mobility (R26.89);Muscle weakness (generalized) (M62.81)    Time: 8295-6213 PT Time Calculation (min) (ACUTE ONLY): 26 min   Charges:   PT Evaluation $PT Eval Low Complexity: 1 Procedure PT Treatments $Therapeutic Activity: 8-22 mins   PT G Codes:   PT G-Codes **NOT FOR INPATIENT CLASS** Functional Assessment Tool Used: AM-PAC 6 Clicks Basic Mobility;Clinical judgement Functional Limitation: Mobility: Walking and moving around Mobility: Walking and Moving Around Current Status ([id number]): At least 60 percent but less than 80 percent impaired, limited or restricted Mobility: Walking and Moving Around Goal Status [phone]): At least 40 percent but less than 60 percent impaired, limited or restricted    Beth Marshell Dilauro, PT, DPT X: [phone]

## 2016-11-24 NOTE — Consult Note (Signed)
Referring Provider: Dr. Karleen Hampshire  Primary Care Physician:  [name], MD Primary Gastroenterologist:  Dr. Oneida Alar   Date of Admission: 11/20/16 Date of Consultation: 11/24/16  Reason for Consultation:  Cirrhosis, ascites, and colitis   HPI:  Wanda [name] is a 41 y.o. year old female with a history of NASH cirrhosis. Previously seen at [facility] for transplant consideration in Feb 2016 but did not present for follow-up. Significant issues with compliance. Last ultrasound paracentesis was 3/23 with 2.7 liters removed. History of SBP in April 2015. On dialysis, and she has been non-compliant with this. Five admissions within the past 6 months noted. Presented this admission with abdominal pain, similar to prior admission in March 2018.   States she "hurts all over", and this brought her to the ED. Sore in her back, sore all over. Denies diarrhea. No overt GI bleeding; she is heme positive. No N/V. Poor appetite. Difficult to engage patient in conversation. She can't tell me why she missed dialysis. Denies fever/chills. She is currently undergoing dialysis at time of consultation. She states a desire to go home and does not want to do rehab. She has dried scabs on her nose, and she is unable to tell me where this came from, stating "just check the chart".   Lipase has been chronically elevated. No CT evidence of pancreatitis with contrast imaging in March 2018. CT this admission without contrast with vague wall thickening along the ascending colon, concerning for infectious or inflammatory colitis. Large volume ascites within abdomen and pelvis; however, US abdomen yesterday with pockets of ascites and unable to complete paracentesis. Zosyn 3.375 mg IV every 12 hours per pharmacy consult but not given yet as patient is undergoing dialysis. First dose ordered for today.   Last EGD in Oct 2017 with evidence of non-bleeding esophageal ulcer, history of MW tear in Sept 2017 with evidence of  obliterated varices at that time.   Past Medical History:  Diagnosis Date  . Acute blood loss anemia 02/25/2014   Status post transfusion  . Acute renal failure ([place]) 09/2013   Pre-renal- resolved  . Anasarca 10/10/2013  . Anxiety   . Bipolar disorder ([place]) 12/04/2013   2007-SEEN IN ED FOR INVOLUNTARY COMMITMENT, UDS POS FOR AMPHETAMINES/OPIATES   . Bleeding esophageal varices ([place]) 02/28/2014   s/p banding  . C. difficile colitis 04/19/2014  . Chronic hypotension   . Cirrhosis ([place]) 10/05/13   Liver bx 11/23/13 (delayed initially due to patient refusal). c/w steatohepatitis  . Cirrhosis of liver with ascites ([employer])   . Depression   . ESRD (end stage renal disease) on dialysis ([place]) 08/2014  . Folate deficiency 09/2013  . Gastroesophageal junction ulcer 09/17/2014  . GERD (gastroesophageal reflux disease)   . Hematemesis/vomiting blood 02/24/2014  . Macrocytosis 02/28/2014  . PNA (pneumonia) 10/13/2013  . SBP (spontaneous bacterial peritonitis) ([place]) 11/10/2013  . Thrombocytopenia ([place])    Hypercellular bone marrow; abundant megakaryocytes per 08/27/2014; s/p bone marrow bx    Past Surgical History:  Procedure Laterality Date  . AV FISTULA PLACEMENT Right 11/16/2014   Procedure: Right arm Creation of arteriovenous fistula;  Surgeon: Angelia Mould, MD;  Location: [place];  Service: Vascular;  Laterality: Right;  . CENTRAL VENOUS CATHETER INSERTION Right   . COLONOSCOPY N/A 12/19/2013   SLF:NO OBVIOUS SOURCE FOR ANEMIA IDETIFIED/ONE COLON POLYP REMOVED/Small internal hemorrhoids  . ESOPHAGEAL BANDING  07/04/2014   Procedure: ESOPHAGEAL BANDING;  Surgeon: Daneil Dolin, MD;  [facility];  Service: Endoscopy;;  . ESOPHAGEAL BANDING N/A 07/24/2014   Procedure: ESOPHAGEAL BANDING (2 bands applied);  Surgeon: Danie Binder, MD;  Location: [facility];  Service: Endoscopy;  Laterality: N/A;  . ESOPHAGEAL BANDING N/A 07/23/2015   Procedure: ESOPHAGEAL BANDING;  Surgeon: Danie Binder, MD;   [facility];  Service: Endoscopy;  Laterality: N/A;  . ESOPHAGEAL BANDING N/A 03/04/2016   Procedure: ESOPHAGEAL BANDING;  Surgeon: Danie Binder, MD;  [facility];  Service: Endoscopy;  Laterality: N/A;  . ESOPHAGEAL BANDING N/A 04/30/2016   Procedure: ESOPHAGEAL BANDING;  Surgeon: Daneil Dolin, MD;  [facility];  Service: Endoscopy;  Laterality: N/A;  . ESOPHAGOGASTRODUODENOSCOPY N/A 11/14/2013   SLF:1 column of very small varices in distal esopahgus/MODERATE PORTAL GASTROPATHY IN PROXIMAL STOMACH/MODERATE erosive gastritis  . ESOPHAGOGASTRODUODENOSCOPY N/A 02/11/2014   Dr. Rourk:Esophageal varices with bleeding stigmata-status post esophageal band ligation therapy. Portal gastropathy  . ESOPHAGOGASTRODUODENOSCOPY N/A 07/04/2014   RMR: Persiting grade 2 esophageal varicies with bleeding stigmata status post band ligation. Significantly congested gastric mucosa iwith changes constistant with protal gastropathy.   . ESOPHAGOGASTRODUODENOSCOPY N/A 09/16/2014   Rehman: Single short column of varix proximal to GE junction not large enough to be banded. Two amall ulcers at the GEJ felt to be source of GI Bleeding but no active bleeding but no actibe bleeding noted. No therapy rendered. Portal gastropathy NO evidence of peptic ulcer diease or gastric varices.   . ESOPHAGOGASTRODUODENOSCOPY N/A 12/14/2014   SLF: Grade ! esophageal varices. 2. Moderate Portal Gastropathy  . ESOPHAGOGASTRODUODENOSCOPY N/A 03/27/2015   Procedure: ESOPHAGOGASTRODUODENOSCOPY (EGD);  Surgeon: Danie Binder, MD;  [facility];  Service: Endoscopy;  Laterality: N/A;  1045am - moved to 817 @ 11:30  . ESOPHAGOGASTRODUODENOSCOPY N/A 06/05/2015   Procedure: ESOPHAGOGASTRODUODENOSCOPY (EGD);  Surgeon: Clarene Essex, MD;  Location: [facility];  Service: Endoscopy;  Laterality: N/A;  . ESOPHAGOGASTRODUODENOSCOPY N/A 07/23/2015   SLF:1. Grade 1 esophageal varices 2. Moderate portal hypertensive  gastropathy 3. MILd non-erosive gastritis.   [name] ESOPHAGOGASTRODUODENOSCOPY N/A 03/04/2016   Dr. Oneida Alar: grade 1 varices, surveillance in Jan 2017   . ESOPHAGOGASTRODUODENOSCOPY N/A 05/21/2016   Dr. Carlean Purl: non-bleeding esophageal ulcer, likely secondary to MW tear  . ESOPHAGOGASTRODUODENOSCOPY (EGD) WITH PROPOFOL N/A 07/24/2014   SLF:  1. 2 columns grade 2-3 varices- 2 Bands applied.  2.  Moderate gastropathy 3. Duodenal Diverticula  . ESOPHAGOGASTRODUODENOSCOPY (EGD) WITH PROPOFOL N/A 10/26/2014   RMR: 2 columns of grade 2 esophageal varices without obvious bleeding stigmata status post band ligation to complet obliteration of remaining varices.   . ESOPHAGOGASTRODUODENOSCOPY (EGD) WITH PROPOFOL N/A 04/30/2016   Dr. Gala Romney: MW tear, varices obliterated, portal gastropathy  . None    . PARACENTESIS  Feb 2015   1180 fluid, negative fluid analysis.   [name] PARACENTESIS  10/2013    Prior to Admission medications   Medication Sig Start Date End Date Taking? Authorizing Provider  clotrimazole (LOTRIMIN) 1 % cream Apply 1 application topically 3 (three) times daily. 09/30/16  Yes Danie Binder, MD  Darbepoetin Alfa (ARANESP) 60 MCG/0.3ML SOSY injection Inject 0.3 mLs (60 mcg total) into the vein every Thursday with hemodialysis. Patient taking differently: Inject 60 mcg into the vein every Tuesday with hemodialysis.  06/09/15  Yes Cherene Altes, MD  lactulose (CHRONULAC) 10 GM/15ML solution Take 45 mLs (30 g total) by mouth 3 (three) times daily. TAKE 15-30 ml BY MOUTH three times a day Patient taking differently: Take  10-20 g by mouth 3 (three) times daily. TAKE 15-30 ml BY MOUTH three times a day 09/27/16  Yes Cherene Altes, MD  lidocaine (XYLOCAINE) 2 % solution TAKE TWO TEASPOONSFUL (10ML) BY MOUTH BEFORE MEALS AND AT BEDTIME TO PREVENT CHEST PAIN WHILE EATING 03/19/16  Yes Carlis Stable, NP  lidocaine-prilocaine (EMLA) cream Apply 1 application topically as needed (for dialysis treatments).   Yes  Historical Provider, MD  LORazepam (ATIVAN) 1 MG tablet Take 1 mg by mouth 2 (two) times daily as needed for anxiety (and/or back spasms).   Yes Historical Provider, MD  midodrine (PROAMATINE) 10 MG tablet Take 1 tablet (10 mg total) by mouth every morning. Patient taking differently: Take 10 mg by mouth See admin instructions. Take ONLY on dialysis days 09/28/16  Yes Cherene Altes, MD  omeprazole (PRILOSEC) 20 MG capsule 1 po every morning 30 minutes prior to your first meal. Patient taking differently: Take 20 mg by mouth daily before breakfast. 1 po every morning 30 minutes prior to your first meal. 09/30/16  Yes Danie Binder, MD  Oxycodone HCl 10 MG TABS Take 0.5 tablets (5 mg total) by mouth 3 (three) times daily as needed. 1 PO TID AS NEEDED FOR PAIN Patient taking differently: Take 5-10 mg by mouth 3 (three) times daily as needed.  09/27/16  Yes Cherene Altes, MD  RENVELA 800 MG tablet Take 1600 mg by mouth 3 times daily with meals. Take 800 mg by mouth with snacks. 10/29/15  Yes Historical Provider, MD  sucralfate (CARAFATE) 1 GM/10ML suspension Take 1 g by mouth 4 (four) times daily -  with meals and at bedtime.   Yes Historical Provider, MD    Current Facility-Administered Medications  Medication Dose Route Frequency Provider Last Rate Last Dose  . 0.9 %  sodium chloride infusion   Intravenous Once Tanna Savoy Stinson, DO      . 0.9 %  sodium chloride infusion  100 mL Intravenous PRN Fran Lowes, MD      . 0.9 %  sodium chloride infusion  100 mL Intravenous PRN Fran Lowes, MD      . calcium acetate (PHOSLO) capsule 1,334 mg  1,334 mg Oral With snacks Fran Lowes, MD   1,334 mg at 11/22/16 2058  . calcium acetate (PHOSLO) capsule 2,668 mg  2,668 mg Oral TID WC Fran Lowes, MD   2,001 mg at 11/24/16 0912  . lactulose (CHRONULAC) 10 GM/15ML solution 30 g  30 g Oral TID Tanna Savoy Stinson, DO   30 g at 11/23/16 1141  . lidocaine (PF) (XYLOCAINE) 1 % injection 5 mL  5  mL Intradermal PRN Fran Lowes, MD      . lidocaine-prilocaine (EMLA) cream 1 application  1 application Topical PRN Fran Lowes, MD      . LORazepam (ATIVAN) tablet 1 mg  1 mg Oral BID PRN Tanna Savoy Stinson, DO   1 mg at 11/24/16 1022  . midodrine (PROAMATINE) tablet 10 mg  10 mg Oral Q T,Th,Sa-HD Tanna Savoy Stinson, DO   10 mg at 11/24/16 1015  . morphine 2 MG/ML injection 1-2 mg  1-2 mg Intravenous Q4H PRN Hosie Poisson, MD   2 mg at 11/24/16 1025  . ondansetron (ZOFRAN) injection 4 mg  4 mg Intravenous Q6H PRN Hosie Poisson, MD   4 mg at 11/24/16 1023  . pantoprazole (PROTONIX) injection 40 mg  40 mg Intravenous Q12H Tanna Savoy Stinson, DO   40 mg at 11/23/16  2202  . pentafluoroprop-tetrafluoroeth (GEBAUERS) aerosol 1 application  1 application Topical PRN Fran Lowes, MD   1 application at 03/54/65 1040  . piperacillin-tazobactam (ZOSYN) IVPB 3.375 g  3.375 g Intravenous Q12H Hosie Poisson, MD      . rifaximin Doreene Nest) tablet 550 mg  550 mg Oral BID Hosie Poisson, MD   550 mg at 11/23/16 2202  . sucralfate (CARAFATE) 1 GM/10ML suspension 1 g  1 g Oral TID WC & HS Tanna Savoy Stinson, DO   1 g at 11/24/16 0913  . white petrolatum (VASELINE) gel   Topical PRN Drue Novel, NP        Allergies as of 11/20/2016 - Review Complete 11/20/2016  Allergen Reaction Noted  . Lasix [furosemide] Other (See Comments) 04/12/2014  . Latex Itching 05/14/2015    Family History  Problem Relation Age of Onset  . Heart disease Mother   . Colon cancer Neg Hx   . Liver disease Neg Hx     Social History   Social History  . Marital status: Single    Spouse name: N/A  . Number of children: 1  . Years of education: N/A   Occupational History  . unemployed    Social History Main Topics  . Smoking status: Former Smoker    Packs/day: 0.25    Years: 20.00    Types: Cigarettes    Quit date: 11/05/2008  . Smokeless tobacco: Never Used     Comment: Never really smoked much  . Alcohol use No  .  Drug use: No  . Sexual activity: Not Currently    Partners: Male    Birth control/ protection: None   Other Topics Concern  . Not on file   Social History Narrative  . No narrative on file    Review of Systems: Gen: see HPI  CV: Denies chest pain, heart palpitations, syncope, edema  Resp:+ SOB GI: see HPI  GU : see HPI  MS: see HPI  Derm: Denies rash, itching, dry skin Psych: Denies depression, anxiety,confusion, or memory loss Heme: see HPI   Physical Exam: Vital signs in last 24 hours: Temp:  [98 F (36.7 C)-98.2 F (36.8 C)] 98 F (36.7 C) (04/17 1040) Pulse Rate:  [80-88] 81 (04/17 1400) Resp:  [10-18] 16 (04/17 1040) BP: (90-105)/(42-54) 99/50 (04/17 1400) SpO2:  [96 %-100 %] 100 % (04/17 1040) Weight:  [166 lb 10.7 oz (75.6 kg)] 166 lb 10.7 oz (75.6 kg) (04/17 1040) Last BM Date: 11/22/16 General:   Alert, flat affect, chronically-ill appearing, sallow Head:  Normocephalic and atraumatic. Eyes:  +scleral icterus  Ears:  Normal auditory acuity. Nose:  No deformity, discharge,  or lesions. Lungs:  Clear throughout to auscultation.  Heart:  S1 S2 present with systolic murmur  Abdomen: +BS, distended with moderately tense ascites, anasarca, protruding umbilical hernia, moderately TTP diffusely Rectal:  Deferred  Msk:  Symmetrical without gross deformities. Normal posture. Extremities:  2-3+ pitting edema and anasarca extensively bilateral lower extremities  Neurologic:  Alert and  oriented x4. Psych:  Alert and cooperative. Flat affect. Short answers, difficult to engage in conversation.   Intake/Output from previous day: No intake/output data recorded. Intake/Output this shift: Total I/O In: 360 [P.O.:360] Out: -   Lab Results:  Recent Labs  11/22/16 0446 11/23/16 1418  WBC 7.3 7.8  HGB 7.9* 8.0*  HCT 24.4* 24.0*  PLT 76* 59*   BMET  Recent Labs  11/22/16 0446 11/23/16 1418  NA 134* 133*  K 4.3 3.3*  CL 99* 95*  CO2 12* 20*  GLUCOSE 99  113*  BUN 147* 73*  CREATININE 16.55* 10.72*  CALCIUM 5.6* 6.8*   LFT  Recent Labs  11/22/16 0446 11/23/16 1418  ALBUMIN 2.6* 2.9*   Studies/Results: Ct Abdomen Pelvis Wo Contrast  Result Date: 11/23/2016 CLINICAL DATA:  Acute onset of worsening abdominal distention. Initial encounter. EXAM: CT ABDOMEN AND PELVIS WITHOUT CONTRAST TECHNIQUE: Multidetector CT imaging of the abdomen and pelvis was performed following the standard protocol without IV contrast. COMPARISON:  CT of the abdomen and pelvis performed 10/30/2016 FINDINGS: Lower chest: Trace bilateral pleural fluid is noted. Diffuse coronary artery calcifications are seen. Hepatobiliary: The nodular contour of the liver is compatible with hepatic cirrhosis. No definite mass is characterized. Stones are seen within the gallbladder. The gallbladder is otherwise grossly unremarkable. The common bile duct remains normal in caliber. 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 Pancreas: The pancreas is within normal limits. Spleen: The spleen is unremarkable in appearance. Adrenals/Urinary Tract: The adrenal glands are unremarkable in appearance. Mild bilateral renal atrophy is noted. There is no evidence of hydronephrosis. No renal or ureteral stones are identified. No perinephric stranding is seen. Stomach/Bowel: The appendix is normal in caliber, without evidence of appendicitis. Vague wall thickening is noted along the ascending colon, concerning for infectious or inflammatory colitis. The remainder of the colon is grossly unremarkable. Contrast is noted within the small bowel. The small bowel is grossly unremarkable in appearance. The stomach is grossly unremarkable in appearance. Vascular/Lymphatic: Scattered calcification is seen along the abdominal aorta and its branches. The abdominal aorta is otherwise grossly unremarkable. The inferior vena cava is grossly unremarkable. No retroperitoneal lymphadenopathy is seen. No pelvic  sidewall lymphadenopathy is identified. Reproductive: The bladder is decompressed and not well assessed. The uterus is grossly unremarkable. The ovaries are relatively symmetric. No suspicious adnexal masses are seen. Other: Relatively large volume ascites is seen within the abdomen and pelvis, extending into a moderate umbilical hernia. Diffuse soft tissue edema is seen tracking along the abdominal and pelvic wall. Asymmetric edema and subcutaneous inflammation are noted at the left breast. Musculoskeletal: No acute osseous abnormalities are identified. The visualized musculature is unremarkable in appearance. IMPRESSION: 1. Vague wall thickening along the ascending colon, concerning for infectious or inflammatory colitis. 2. Relatively large volume ascites within the abdomen and pelvis, extending into a moderate umbilical hernia. 3. Diffuse soft tissue edema tracking along the abdominal and pelvic wall. 4. Asymmetric edema and subcutaneous inflammation noted at the left breast. This could reflect underlying systemic edema, though would correlate clinically to exclude inflammatory cancer of the left breast. 5. Findings of hepatic cirrhosis, with prominent splenic, gastric and right renal varices. 6. Trace bilateral pleural fluid. 7. Diffuse coronary artery calcifications seen. 8. Cholelithiasis.  Gallbladder otherwise unremarkable. 9. Mild bilateral renal atrophy noted. 10. Scattered aortic atherosclerosis. Electronically Signed   By: Garald Balding M.D.   On: 11/23/2016 18:08   US Abdomen Limited  Result Date: 11/23/2016 CLINICAL DATA:  Ascites.  Evaluate for paracentesis. EXAM: LIMITED ABDOMEN ULTRASOUND FOR ASCITES TECHNIQUE: Limited ultrasound survey for ascites was performed in all four abdominal quadrants. COMPARISON:  Abdominal series 413 2018.  Ultrasound 10/30/2016. FINDINGS: Small pockets of ascites noted in both lower quadrants. Ascites volume not enough for safe paracentesis. Paracentesis not  performed at this time . IMPRESSION: Small amount of ascites.  Paracentesis not performed at this time. Electronically Signed   By: Marcello Moores  Register  On: 11/23/2016 09:50    Impression: 41 year old female with decompensated NASH cirrhosisa (MELD Na 25 on admission), presenting with acute on chronic abdominal pain, ESRD non-compliant with outpatient dialysis, and acute on chronic anemia without overt GI bleeding.   Abdominal pain: doubt SBP. Patient has had multiple CTs due to abdominal pain. Recent admission with fluid analysis negative for SBP and similar symptom on this admission. US paracentesis attempted yesterday but insufficient volume for therapeutic paracentesis. However, if concern for SBP, could perform diagnostic only and obtain fluid analysis. Notable anasarca on exam as well. Chronically elevated lipase non-specific in setting of advanced renal disease. No CT evidence of pancreatitis on prior imaging with contrast (CT this admission without contrast).   Colitis on CT: clinically without symptoms of colitis. Zosyn per pharmacy ordered empirically.   Anemia: acute on chronic without overt GI bleeding. Received 3 units PRBCs this admission. Multifactorial. Recent EGD on file from Oct 2017.   Patient's prognosis is grim; she has been non-compliant with recommendations historically and has missed several outpatient dialysis visits. Today's visit was difficult to obtain any information, and I appreciate palliative care involvement. Options are limited for care other than supportive measures.   Plan: Check CBC, HFP, INR, BMP in am Lactulose titrated to achieve 3 soft BMs daily, Xifaxan BID Change Protonix to oral BID Wanda discuss with Dr. Oneida Alar proceeding with diagnostic paracentesis if able to safely complete per radiology  Follow H/H, monitor for overt bleeding Appreciate Palliative Care consultation Wanda continue to follow with you  Annitta Needs, ANP-BC [facility]        LOS: 4 days    11/24/2016, 2:05 PM

## 2016-11-24 NOTE — Progress Notes (Signed)
Daily Progress Note   Patient Name: Wanda [name]       Date: 11/24/2016 DOB: 19-May-1976  Age: 41 y.o. MRN#: [id number] Attending Physician: Hosie Poisson, MD Primary Care Physician: [name], MD Admit Date: 11/20/2016  Reason for Consultation/Follow-up: Establishing goals of care and Psychosocial/spiritual support  Subjective: Wanda [name]'s lying quietly in bed. She opens her eyes and briefly makes but does not keep eye contact when I call her name. She is having hemodialysis in her room at this time. There is no family at bedside.  We talk about Wanda [name]'s new diagnosis of colitis. We talk about her history of SBP, and CDiff.   We talk about treatment plans, and G.I. consult.  I bring up code status today. We gently talk about the realities of CPR and intubation. I share that Wanda [name] decides what she does and does not want, we are here to give her information, let her know about CPR and intubation. I share that it is important that her son, Wanda [name], be aware of her choices, as he is her legal healthcare power of attorney at this point. She makes no complaint.  We talk about medication, Wanda [name] states she wants a by mouth antibiotic instead of an IV antibiotic. We talk about periods of medication holiday. I share that I will come back to visit her tomorrow. On that visit I will talk about hemodialysis, and what happens when people stop.  Length of Stay: 4  Current Medications: Scheduled Meds:  . calcium acetate  1,334 mg Oral With snacks  . calcium acetate  2,668 mg Oral TID WC  . lactulose  30 g Oral TID  . midodrine  10 mg Oral Q T,Th,Sa-HD  . pantoprazole (PROTONIX) IV  40 mg Intravenous Q12H  . rifaximin  550 mg Oral BID  . sucralfate  1 g Oral TID WC & HS      Continuous Infusions: . sodium chloride    . sodium chloride    . sodium chloride    . piperacillin-tazobactam (ZOSYN)  IV      PRN Meds: sodium chloride, sodium chloride, lidocaine (PF), lidocaine-prilocaine, LORazepam, morphine injection, ondansetron (ZOFRAN) IV, pentafluoroprop-tetrafluoroeth, white petrolatum  Physical Exam  Constitutional: She is oriented to person, place, and time. No distress.  Sleepy, makes but does not  keep eye contact, calm, cooperative.  HENT:  Head: Normocephalic and atraumatic.  Cardiovascular: Normal rate and regular rhythm.   Pulmonary/Chest: Effort normal. No respiratory distress.  Abdominal: She exhibits distension.  Abdomen tight  Musculoskeletal: She exhibits edema.  Neurological: She is alert and oriented to person, place, and time.  Skin: Skin is warm and dry.  Jaundiced, anasarca  Nursing note and vitals reviewed.           Vital Signs: BP (!) 101/47   Pulse 86   Temp 98 F (36.7 C) (Oral)   Resp 16   Ht 5\' 6"  (1.676 m)   Wt 75.6 kg (166 lb 10.7 oz)   LMP 09/21/2013   SpO2 100%   BMI 26.90 kg/m  SpO2: SpO2: 100 % O2 Device: O2 Device: Not Delivered O2 Flow Rate:    Intake/output summary:  Intake/Output Summary (Last 24 hours) at 11/24/16 1313 Last data filed at 11/24/16 0900  Gross per 24 hour  Intake              360 ml  Output                0 ml  Net              360 ml   LBM: Last BM Date: 11/22/16 Baseline Weight: Weight: 66.2 kg (146 lb) Most recent weight: Weight: 75.6 kg (166 lb 10.7 oz)       Palliative Assessment/Data:    Flowsheet Rows     Most Recent Value  Intake Tab  Referral Department  Hospitalist  Unit at Time of Referral  ICU  Palliative Care Primary Diagnosis  Nephrology  Date Notified  11/23/16  Palliative Care Type  New Palliative care  Reason for referral  Advance Care Planning, Clarify Goals of Care  Date of Admission  11/20/16  Date first seen by Palliative Care  11/23/16  # of  days Palliative referral response time  0 Day(s)  # of days IP prior to Palliative referral  3  Clinical Assessment  Palliative Performance Scale Score  40%  Pain Max last 24 hours  Not able to report  Pain Min Last 24 hours  Not able to report  Dyspnea Max Last 24 Hours  Not able to report  Dyspnea Min Last 24 hours  Not able to report  Psychosocial & Spiritual Assessment  Palliative Care Outcomes  Patient/Family meeting held?  Yes  Who was at the meeting?  Patient only today  Palliative Care Outcomes  Provided psychosocial or spiritual support, Provided advance care planning      Patient Active Problem List   Diagnosis Date Noted  . Palliative care encounter   . Pulmonary edema 11/20/2016  . Hypocalcemia 11/20/2016  . Cardiomyopathy- ? ischemic  10/09/2016  . Troponin level elevated 10/09/2016  . Goals of care, counseling/discussion   . Palliative care by specialist   . Seizure ([place]) 10/07/2016  . Pressure injury of skin 05/20/2016  . Shock ([place]) 05/19/2016  . Rhonchi   . Chronic pain syndrome 08/22/2015  . Abdominal pain 08/07/2015  . Umbilical hernia without obstruction and without gangrene 08/07/2015  . GI bleed   . Acute blood loss anemia   . Hypotension 06/05/2015  . Edema of left lower extremity 02/22/2015  . Ascites of liver   . History of esophageal varices with bleeding 09/16/2014  . End stage renal disease on dialysis ([place])   . Anemia of chronic disease   . Thrombocytopenia (  [place]) 08/19/2014  . Hyponatremia 08/17/2014  . Esophageal varices without bleeding ([place])   . Portal hypertensive gastropathy ([place])   . Malnutrition of moderate degree ([place]) 05/15/2014  . Bipolar disorder ([place]) 12/04/2013  . SBP (spontaneous bacterial peritonitis) ([place]) 11/10/2013  . Liver cirrhosis secondary to nonalcoholic steatohepatitis (NASH) ([place]) 11/07/2013  . Folate deficiency 10/13/2013    Palliative Care Assessment & Plan   Patient Profile: 41 y.o. female  with past  medical history of Anxiety and depression, bipolar disorder, NASH, liver cirrhosis, history is C. difficile colitis, pneumonia, spontaneous bacterial peritonitis, ESRD on dialysis, bleeding esophageal varices banded in 2015, admitted on 11/20/2016 with abdominal pain possibly secondary to ascites, end-stage renal disease on dialysis, but she has missed the last several weeks of dialysis.   Assessment: End-stage kidney disease; Ms. Gruwell has missed approximately 3 weeks of hemodialysis. She is unable to give a clear answer as to why she chose to skip dialysis. As she improves, it may benefit her to discuss her options for discontinuing of hemodialysis, and what that would look like for her. She took dialysis Sunday and today.  NASH; Ms. Vaillancourt has a complicated G.I. history. She is found have colitis at this time, and has a history of SBP, C diff colitis.   Recommendations/Plan:   continue to treat the treatable, healthcare power of attorney and code status discussions.   Goals of Care and Additional Recommendations:  Limitations on Scope of Treatment: Full Scope Treatment  Code Status:    Code Status Orders        Start     Ordered   11/20/16 2155  Full code  Continuous     04 /13/18 2155    Code Status History    Date Active Date Inactive Code Status Order ID Comments User Context   10/30/2016  3:58 AM 10/30/2016 10:01 PM Full Code [id number]  Vianne Bulls, MD ED   10/07/2016  9:56 PM 10/10/2016  8:30 PM Full Code [id number]  Omar Person, NP Inpatient   09/23/2016 12:48 AM 09/27/2016 11:58 PM Full Code [id number]  Corey Harold, NP Inpatient   05/19/2016  7:40 PM 05/27/2016  6:47 PM Full Code [id number]  Corey Harold, NP Inpatient   04/29/2016  2:45 AM 04/30/2016  9:15 AM Full Code [id number]  Orvan Falconer, MD Inpatient   11/06/2015  4:35 AM 11/07/2015  5:21 PM Full Code [id number]  Phillips Grout, MD ED   08/20/2015  8:34 AM 08/22/2015  7:05 PM Full Code [id number]  Nita Sells, MD  Inpatient   06/04/2015 10:06 PM 06/09/2015  9:37 PM Full Code [id number]  Corey Harold, NP Inpatient   10/25/2014 10:02 PM 11/01/2014  4:01 PM Full Code [id number]  Doree Albee, MD ED   09/16/2014  2:39 AM 09/19/2014  7:30 PM Full Code [id number]  Phillips Grout, MD Inpatient   08/27/2014  5:28 PM 08/29/2014 12:10 AM Full Code [id number]  Kathie Dike, MD Inpatient   08/27/2014 11:53 AM 08/27/2014  5:28 PM Full Code [id number]  Azzie Roup, MD [place]   08/13/2014 11:03 PM 08/27/2014 11:53 AM Full Code [id number]  Jani Gravel, MD Inpatient   07/03/2014  6:09 PM 07/07/2014 11:07 PM Full Code [id number]  Waldemar Dickens, MD Inpatient   05/14/2014  6:30 PM 05/18/2014  5:03 PM Full Code [id number]  Waldemar Dickens, MD Inpatient   04/19/2014  9:56 PM 04/27/2014  6:33 PM Full Code [id number]  Costin Karlyne Greenspan, MD  ED   02/25/2014 12:46 AM 03/02/2014  5:03 PM Full Code [id number]  Bonnielee Haff, MD Inpatient   11/23/2013 11:52 AM 11/24/2013  3:31 AM Full Code [id number]  Arne Cleveland, MD H[facility]   11/05/2013  9:15 PM 11/17/2013  8:47 PM Full Code [id number]  Allyne Gee, MD Inpatient   10/06/2013  1:20 AM 10/13/2013  9:21 PM Full Code [id number]  Bonnielee Haff, MD ED   09/11/2013 11:21 PM 09/13/2013  5:23 PM Full Code [id number]  Bonnielee Haff, MD Inpatient       Prognosis:   < 6 months would not be surprising based on 5 hospitalizations in the last 6 months, missing 3 weeks of dialysis, frailty. This is complicated by hemodialysis.  Discharge Planning:  To be determined, at this point likely home with home health per patient's request. We are discussing SNF for rehab.  Care plan was discussed with nursing staff, case manager, social worker, and Dr. Karleen Hampshire.   Thank you for allowing the Palliative Medicine Team to assist in the care of this patient.   Time In: 1110 Time Out: 1150 Total Time 40 minutes Prolonged Time Billed  no       Greater than 50%  of this time was spent counseling and coordinating care related to the  above assessment and plan.  Drue Novel, NP  Please contact Palliative Medicine Team phone at [phone] for questions and concerns.

## 2016-11-24 NOTE — Progress Notes (Signed)
the is a Subjective: Interval History: The patient offers no complaints. She denies any nausea or vomiting. Her breathing and swelling is better.  Objective: Vital signs in last 24 hours: Temp:  [98 F (36.7 C)-98.3 F (36.8 C)] 98.2 F (36.8 C) (04/17 0453) Pulse Rate:  [77-90] 86 (04/17 0453) Resp:  [10-18] 16 (04/17 0453) BP: (97-100)/(48-52) 100/50 (04/17 0453) SpO2:  [95 %-100 %] 100 % (04/17 0453) Weight:  [75.6 kg (166 lb 10.7 oz)] 75.6 kg (166 lb 10.7 oz) (04/17 0453) Weight change: -0.1 kg (-3.5 oz)  Intake/Output from previous day: No intake/output data recorded. Intake/Output this shift: No intake/output data recorded.  Generally patient is alert and in upper and distress Chest decreased breath sounds bilaterally Heart exam regular rate and rhythm Abdomen: Distended, nontender. Positive bowel sounds Extremities she has about 1-2+ edema  Lab Results:  Recent Labs  11/22/16 0446 11/23/16 1418  WBC 7.3 7.8  HGB 7.9* 8.0*  HCT 24.4* 24.0*  PLT 76* 59*   BMET:   Recent Labs  11/22/16 0446 11/23/16 1418  NA 134* 133*  K 4.3 3.3*  CL 99* 95*  CO2 12* 20*  GLUCOSE 99 113*  BUN 147* 73*  CREATININE 16.55* 10.72*  CALCIUM 5.6* 6.8*   No results for input(s): PTH in the last 72 hours. Iron Studies:   Recent Labs  11/22/16 1452  IRON 31  TIBC 262  FERRITIN 147    Studies/Results: Ct Abdomen Pelvis Wo Contrast  Result Date: 11/23/2016 CLINICAL DATA:  Acute onset of worsening abdominal distention. Initial encounter. EXAM: CT ABDOMEN AND PELVIS WITHOUT CONTRAST TECHNIQUE: Multidetector CT imaging of the abdomen and pelvis was performed following the standard protocol without IV contrast. COMPARISON:  CT of the abdomen and pelvis performed 10/30/2016 FINDINGS: Lower chest: Trace bilateral pleural fluid is noted. Diffuse coronary artery calcifications are seen. Hepatobiliary: The nodular contour of the liver is compatible with hepatic cirrhosis. No  definite mass is characterized. Stones are seen within the gallbladder. The gallbladder is otherwise grossly unremarkable. The common bile duct remains normal in caliber. 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 Pancreas: The pancreas is within normal limits. Spleen: The spleen is unremarkable in appearance. Adrenals/Urinary Tract: The adrenal glands are unremarkable in appearance. Mild bilateral renal atrophy is noted. There is no evidence of hydronephrosis. No renal or ureteral stones are identified. No perinephric stranding is seen. Stomach/Bowel: The appendix is normal in caliber, without evidence of appendicitis. Vague wall thickening is noted along the ascending colon, concerning for infectious or inflammatory colitis. The remainder of the colon is grossly unremarkable. Contrast is noted within the small bowel. The small bowel is grossly unremarkable in appearance. The stomach is grossly unremarkable in appearance. Vascular/Lymphatic: Scattered calcification is seen along the abdominal aorta and its branches. The abdominal aorta is otherwise grossly unremarkable. The inferior vena cava is grossly unremarkable. No retroperitoneal lymphadenopathy is seen. No pelvic sidewall lymphadenopathy is identified. Reproductive: The bladder is decompressed and not well assessed. The uterus is grossly unremarkable. The ovaries are relatively symmetric. No suspicious adnexal masses are seen. Other: Relatively large volume ascites is seen within the abdomen and pelvis, extending into a moderate umbilical hernia. Diffuse soft tissue edema is seen tracking along the abdominal and pelvic wall. Asymmetric edema and subcutaneous inflammation are noted at the left breast. Musculoskeletal: No acute osseous abnormalities are identified. The visualized musculature is unremarkable in appearance. IMPRESSION: 1. Vague wall thickening along the ascending colon, concerning for infectious or  inflammatory colitis. 2.  Relatively large volume ascites within the abdomen and pelvis, extending into a moderate umbilical hernia. 3. Diffuse soft tissue edema tracking along the abdominal and pelvic wall. 4. Asymmetric edema and subcutaneous inflammation noted at the left breast. This could reflect underlying systemic edema, though would correlate clinically to exclude inflammatory cancer of the left breast. 5. Findings of hepatic cirrhosis, with prominent splenic, gastric and right renal varices. 6. Trace bilateral pleural fluid. 7. Diffuse coronary artery calcifications seen. 8. Cholelithiasis.  Gallbladder otherwise unremarkable. 9. Mild bilateral renal atrophy noted. 10. Scattered aortic atherosclerosis. Electronically Signed   By: Garald Balding M.D.   On: 11/23/2016 18:08   US Abdomen Limited  Result Date: 11/23/2016 CLINICAL DATA:  Ascites.  Evaluate for paracentesis. EXAM: LIMITED ABDOMEN ULTRASOUND FOR ASCITES TECHNIQUE: Limited ultrasound survey for ascites was performed in all four abdominal quadrants. COMPARISON:  Abdominal series 413 2018.  Ultrasound 10/30/2016. FINDINGS: Small pockets of ascites noted in both lower quadrants. Ascites volume not enough for safe paracentesis. Paracentesis not performed at this time . IMPRESSION: Small amount of ascites.  Paracentesis not performed at this time. Electronically Signed   By: Marcello Moores  Register   On: 11/23/2016 09:50   Dg Chest Port 1 View  Result Date: 11/22/2016 CLINICAL DATA:  Pulmonary edema. EXAM: PORTABLE CHEST 1 VIEW COMPARISON:  Acute abdominal series 11/20/2016 FINDINGS: Heart is mildly enlarged. Interstitial edema is improved. Previously-seen upper lobe airspace consolidation is improved. The left base is not imaged. Atherosclerotic calcifications are present at the aortic arch. IMPRESSION: 1. Cardiomegaly with improving interstitial edema. Electronically Signed   By: San Morelle M.D.   On: 11/22/2016 10:30    I have reviewed the patient's current  medications.  Assessment/Plan: Problem #1 anasarca: Patient is still with significant edema but improving. Presently she denies any difficulty breathing. Problem #2 end-stage renal disease/uremia. Patient presently denies any nausea or vomiting. Her appetite is good Problem #3 metabolic acidosis: Her CO2 has improved Problem #4 metabolic bone disease: Calcium is very low however her phosphorus is very high. Most her calcium and phosphorus is improving. Presently patient is on binder. Problem #5 anemia: She is status post blood transfusion. Her hemoglobin remains low. This is secondary to GI bleeding and possibly also secondary to chronic renal failure. Her hemoglobin remains  stable. Problem #6 liver cirrhosis Plan: 1] will dialyze her for 4 hours . We'll use albumin and low dialysate temperature and possibly remove part 3 L if her blood pressure tolerates. 2] will check her renal panel and CBC in the morning    LOS: 4 days   Laksh Hinners S 11/24/2016,7:46 AM

## 2016-11-24 NOTE — Progress Notes (Signed)
Patient refusing Lactulose and calcium binders. Dr. Oneida Alar made aware. Orders modified. Patient states these orders are not how she takes them at home.

## 2016-11-25 DIAGNOSIS — Z9119 Patient's noncompliance with other medical treatment and regimen: Secondary | ICD-10-CM

## 2016-11-25 DIAGNOSIS — R601 Generalized edema: Secondary | ICD-10-CM

## 2016-11-25 LAB — BASIC METABOLIC PANEL
ANION GAP: 13 (ref 5–15)
BUN: 47 mg/dL — ABNORMAL HIGH (ref 6–20)
CALCIUM: 8 mg/dL — AB (ref 8.9–10.3)
CHLORIDE: 96 mmol/L — AB (ref 101–111)
CO2: 25 mmol/L (ref 22–32)
CREATININE: 6.6 mg/dL — AB (ref 0.44–1.00)
GFR calc non Af Amer: 7 mL/min — ABNORMAL LOW (ref >60)
GFR, EST AFRICAN AMERICAN: 8 mL/min — AB (ref >60)
Glucose, Bld: 109 mg/dL — ABNORMAL HIGH (ref 65–99)
Potassium: 3.1 mmol/L — ABNORMAL LOW (ref 3.5–5.1)
SODIUM: 134 mmol/L — AB (ref 135–145)

## 2016-11-25 LAB — CBC
HCT: 23.9 % — ABNORMAL LOW (ref 36.0–46.0)
Hemoglobin: 7.6 g/dL — ABNORMAL LOW (ref 12.0–15.0)
MCH: 31.3 pg (ref 26.0–34.0)
MCHC: 31.8 g/dL (ref 30.0–36.0)
MCV: 98.4 fL (ref 78.0–100.0)
PLATELETS: 44 10*3/uL — AB (ref 150–400)
RBC: 2.43 MIL/uL — ABNORMAL LOW (ref 3.87–5.11)
RDW: 19.4 % — ABNORMAL HIGH (ref 11.5–15.5)
WBC: 8.2 10*3/uL (ref 4.0–10.5)

## 2016-11-25 LAB — HEPATIC FUNCTION PANEL
ALBUMIN: 3 g/dL — AB (ref 3.5–5.0)
ALK PHOS: 99 U/L (ref 38–126)
ALT: 16 U/L (ref 14–54)
AST: 41 U/L (ref 15–41)
BILIRUBIN INDIRECT: 1.1 mg/dL — AB (ref 0.3–0.9)
Bilirubin, Direct: 0.4 mg/dL (ref 0.1–0.5)
TOTAL PROTEIN: 5.7 g/dL — AB (ref 6.5–8.1)
Total Bilirubin: 1.5 mg/dL — ABNORMAL HIGH (ref 0.3–1.2)

## 2016-11-25 LAB — PROTIME-INR
INR: 1.17
PROTHROMBIN TIME: 14.9 s (ref 11.4–15.2)

## 2016-11-25 MED ORDER — POTASSIUM CHLORIDE CRYS ER 20 MEQ PO TBCR
30.0000 meq | EXTENDED_RELEASE_TABLET | Freq: Once | ORAL | Status: DC
  Filled 2016-11-25 (×2): qty 1

## 2016-11-25 MED ORDER — FAMOTIDINE 20 MG PO TABS
40.0000 mg | ORAL_TABLET | Freq: Two times a day (BID) | ORAL | Status: DC
  Administered 2016-11-25: 40 mg via ORAL
  Filled 2016-11-25: qty 2

## 2016-11-25 MED ORDER — FAMOTIDINE 20 MG PO TABS
40.0000 mg | ORAL_TABLET | Freq: Every day | ORAL | Status: DC
  Administered 2016-11-26: 40 mg via ORAL
  Filled 2016-11-25: qty 2

## 2016-11-25 NOTE — Progress Notes (Signed)
Wanda [name]  MRN: [id number]  DOB/AGE: 1975/11/22 41 y.o.  Primary Care Physician:ROBERSON, [name], MD  Admit date: 11/20/2016  Chief Complaint:  Chief Complaint  Patient presents with  . Abdominal Pain  . Leg Swelling    S-Pt presented on  11/20/2016 with  Chief Complaint  Patient presents with  . Abdominal Pain  . Leg Swelling  .     Pt offers no specific complaints.    Pt is tearful and says everything just hurts    Pt says I still have fluid in my belly and legs.I educated pt about need of adhreence to dialysis tx..       Meds . calcium acetate  1,334 mg Oral With snacks  . calcium acetate  2,668 mg Oral TID WC  . famotidine  40 mg Oral BID  . folic acid  1 mg Oral Daily  . lactulose  10 g Oral TID  . midodrine  10 mg Oral Q T,Th,Sa-HD  . oxyCODONE  10 mg Oral TID     Physical Exam: Vital signs in last 24 hours: Temp:  [98 F (36.7 C)-98.4 F (36.9 C)] 98.1 F (36.7 C) (04/18 0646) Pulse Rate:  [78-88] 78 (04/18 0646) Resp:  [16-17] 16 (04/18 0646) BP: (86-106)/(42-59) 98/59 (04/18 0646) SpO2:  [100 %] 100 % (04/18 0646) Weight:  [166 lb 10.7 oz (75.6 kg)] 166 lb 10.7 oz (75.6 kg) (04/17 1040) Weight change: 0 lb (0 kg) Last BM Date: 11/24/16  Intake/Output from previous day: 04/17 0701 - 04/18 0700 In: 960 [P.O.:960] Out: 2800  No intake/output data recorded.   Physical Exam: General- pt is awake,alert, oriented to time place and person Resp- No acute REsp distress,decreased Bs at bases. CVS- S1S2 regular in rate and rhythm GIT- BS+, soft, distended+  EXT-2+ LE Edema,NO Cyanosis Access Right AVF +   Lab Results: CBC  Recent Labs  11/23/16 1418 11/25/16 0554  WBC 7.8 8.2  HGB 8.0* 7.6*  HCT 24.0* 23.9*  PLT 59* 44*    BMET  Recent Labs  11/23/16 1418 11/25/16 0554  NA 133* 134*  K 3.3* 3.1*  CL 95* 96*  CO2 20* 25  GLUCOSE 113* 109*  BUN 73* 47*  CREATININE 10.72* 6.60*  CALCIUM 6.8* 8.0*    MICRO Recent Results  (from the past 240 hour(s))  MRSA PCR Screening     Status: None   Collection Time: 11/20/16  9:45 PM  Result Value Ref Range Status   MRSA by PCR NEGATIVE NEGATIVE Final    Comment:        The GeneXpert MRSA Assay (FDA approved for NASAL specimens only), is one component of a comprehensive MRSA colonization surveillance program. It is not intended to diagnose MRSA infection nor to guide or monitor treatment for MRSA infections.       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

## 2016-11-25 NOTE — Progress Notes (Signed)
Physical Therapy Treatment Patient Details Name: Wanda [name] MRN: [id number] DOB: 28-Oct-1975 Today's Date: 11/25/2016    History of Present Illness 41 y.o. female with a history of ESRD on dialysis, NASH with cirrhosis and ascites, depression, bipolar, depression, chronic pain, h/o esophageal varices with bleeding. This is the fourth hospitalization over the past 6 months. Patient seen due to abdominal pain which is diffuse, which she was hospitalized for back of the end of March. The patient was released after the abdominal pain had resolved the following day after dialysis. She did have a paracentesis done while at the hospital. Today, the patient and missed a pain it's been increasing over the past several weeks. She is not able to provide much other history of her abdominal pain as she is a poor historian. She does admit to missing approximately 3 weeks of dialysis. She states that the last time she had dialysis was when she was hospitalized 3 weeks ago. She states that she misses her dialysis because she doesn't feel well.    PT Comments    Pt received in bed, and was agreeable to PT tx.  She was able to increase gait distance, however she refuses to attempt sitting up in the chair today.  She continues to require MinA for gait x58ft with RW due to posterior lean, and she is a high fall risk.   Continue to recommend SNF.   Follow Up Recommendations  SNF;Home health PT;Supervision/Assistance - 24 hour     Equipment Recommendations  None recommended by PT    Recommendations for Other Services       Precautions / Restrictions Precautions Precautions: Fall Precaution Comments: Due to immobility Restrictions Weight Bearing Restrictions: No    Mobility  Bed Mobility Overal bed mobility: Needs Assistance Bed Mobility: Supine to Sit;Sit to Supine     Supine to sit: Supervision;HOB elevated Sit to supine: Supervision   General bed mobility comments: pt requires increased time  and vc's for technique.    Transfers Overall transfer level: Needs assistance Equipment used: Rolling walker (2 wheeled) Transfers: Sit to/from Stand Sit to Stand: Min assist;From elevated surface         General transfer comment: vc's for hand placement, and upon sitting down, cues given for pt to get all the way lined up with where she is going to sit on the EOB.  Pt refused sitting up in the chair today  Ambulation/Gait Ambulation/Gait assistance: Min guard Ambulation Distance (Feet): 15 Feet Assistive device: Rolling walker (2 wheeled) Gait Pattern/deviations: Step-to pattern;Wide base of support;Trunk flexed   Gait velocity interpretation: <1.8 ft/sec, indicative of risk for recurrent falls General Gait Details: Pt needs a great amount of encouragement to participate in gait training.    Stairs            Wheelchair Mobility    Modified Rankin (Stroke Patients Only)       Balance Overall balance assessment: Needs assistance Sitting-balance support: Bilateral upper extremity supported;Feet supported Sitting balance-Leahy Scale: Fair Sitting balance - Comments: Slight posterior lean     Standing balance-Leahy Scale: Poor Standing balance comment: Continued posterior lean                            Cognition Arousal/Alertness: Awake/alert Behavior During Therapy: WFL for tasks assessed/performed Overall Cognitive Status: Within Functional Limits for tasks assessed  Exercises Total Joint Exercises Bridges: Strengthening;10 reps;Supine;Limitations Bridges Limitations: Pt is unable to lift buttocks up off the bed, but noticeable contraction where she is attempting.  General Exercises - Lower Extremity Ankle Circles/Pumps: AROM;Both;20 reps;Supine Heel Slides: Strengthening;Both;10 reps;Supine    General Comments        Pertinent Vitals/Pain Pain Assessment:  (pt does not express pain  today. )    Home Living                      Prior Function            PT Goals (current goals can now be found in the care plan section) Acute Rehab PT Goals Patient Stated Goal: Pt wants to go home PT Goal Formulation: With patient/family Time For Goal Achievement: 12/08/16 Potential to Achieve Goals: Fair Progress towards PT goals: Progressing toward goals    Frequency    Min 3X/week      PT Plan Current plan remains appropriate    Co-evaluation             End of Session Equipment Utilized During Treatment: Gait belt Activity Tolerance: Patient tolerated treatment well Patient left: in bed (Pt refused to sit up in the chair) Nurse Communication: Mobility status (mobility sheet updated) PT Visit Diagnosis: Other abnormalities of gait and mobility (R26.89);Muscle weakness (generalized) (M62.81)     Time: 2458-0998 PT Time Calculation (min) (ACUTE ONLY): 25 min  Charges:  $Gait Training: 8-22 mins $Therapeutic Exercise: 8-22 mins                    G Codes:       Beth Logen Heintzelman, PT, DPT X: [phone]

## 2016-11-25 NOTE — Progress Notes (Signed)
PROGRESS NOTE    Wanda [name]  RCV:[id number] DOB: [birth year] DOA: 11/20/2016 PCP: [name], MD    Brief Narrative:  Wanda Mcgahee Crowderis a 41 y.o.femalewith a history of ESRD on dialysis, NASH with cirrhosis and ascites, depression, bipolar, depression, chronic pain, h/o esophageal varices with bleeding, presents with abdominal pain since 3 weeks, and sob , last HD was 3 weeks ago. On arrival to she was found to be in pulmonary edema. She was admitted for further evaluation. Nephrology consulted and recommendations given. Gastroenterology consulted secondary to abdominal pain and concern for SBP.    Assessment & Plan:   Principal Problem:   Abdominal pain Active Problems:   Liver cirrhosis secondary to nonalcoholic steatohepatitis (NASH) (HCC)   End stage renal disease on dialysis (HCC)   Ascites of liver   GI bleed   Acute blood loss anemia   Pulmonary edema   Hypocalcemia   Palliative care encounter   Liver cirrhosis secondary to NASH (nonalcoholic steatohepatitis) (HCC)   Abdominal Pain - can d/c Zosyn - GI consulted, appreciate recommendations - patient has been refusing most medications except pain medications - SBP doubtful - question of subcutaneous edema as etiology  ESRD on HD - noncompliant outpatient - underwent HD yesterday - nephrology consulted - appreciate nephrology recommendations  Cirrhosis - 2/2 steatohepatitis  Acute encephalopathy - elevated ammonia levels - oriented today - continue lactulose  Chronic anemia 2/2 renal disease - repeat CBC daily - 2 units PRBC's ordered previously - transfuse if Hemoglobin <7  Goals of care - palliative care consulted - appreciate their recommendations - question of patient's insight into medical problems   DVT prophylaxis: scd's Code Status: Full code Family Communication: No family bedside Disposition Plan: unclear   Consultants:   Nephrology  Gastroenterology  Palliative  Care  Procedures:   Dialysis  Antimicrobials:   Zosyn    Subjective: Patient sitting in bed watching television.  She is not engaging in conversation about palliative care conversation.  She is very concerned about her socks needing to be changed.  Objective: Vitals:   11/24/16 1445 11/24/16 1500 11/24/16 2238 11/25/16 0646  BP: (!) 88/42 (!) 99/43 (!) 100/52 (!) 98/59  Pulse: 81 83 80 78  Resp:  17 16 16   Temp:  98.3 F (36.8 C) 98.4 F (36.9 C) 98.1 F (36.7 C)  TempSrc:  Oral Oral Oral  SpO2:  100% 100% 100%  Weight:      Height:        Intake/Output Summary (Last 24 hours) at 11/25/16 0943 Last data filed at 11/24/16 1500  Gross per 24 hour  Intake              600 ml  Output             2800 ml  Net            -2200 ml   Filed Weights   11/22/16 1045 11/24/16 0453 11/24/16 1040  Weight: 75.7 kg (166 lb 14.2 oz) 75.6 kg (166 lb 10.7 oz) 75.6 kg (166 lb 10.7 oz)    Examination:  General exam: Appears calm and comfortable  Respiratory system:  Respiratory effort normal. Bibasilar rales appreciated Cardiovascular system: S1 & S2 heard, RRR. No JVD, murmurs, rubs, gallops or clicks. 3+ pedal edema. Gastrointestinal system: Abdomen is distended, soft and tenderness most in the left lower quadrant. No organomegaly or masses felt. Normal bowel sounds heard. Central nervous system: Alert and oriented. No focal neurological deficits.  Extremities: symmetric bilateral upper and lower extremity edema 3+. Skin: No rashes, lesions or ulcers Psychiatry: Questionable insight into significant medical comorbidities. Mood & affect appropriate.     Data Reviewed: I have personally reviewed following labs and imaging studies  CBC:  Recent Labs Lab 11/20/16 1519 11/21/16 1128 11/22/16 0446 11/23/16 1418 11/25/16 0554  WBC 9.5 7.7 7.3 7.8 8.2  NEUTROABS 8.6*  --   --   --   --   HGB 6.8* 8.7* 7.9* 8.0* 7.6*  HCT 19.8* 25.2* 24.4* 24.0* 23.9*  MCV 98.0 93.0 94.9 95.6  98.4  PLT 138* 110* 76* 59* 44*   Basic Metabolic Panel:  Recent Labs Lab 11/20/16 1519 11/20/16 1536 11/20/16 1702 11/21/16 1128 11/22/16 0446 11/23/16 1418 11/25/16 0554  NA 132*  --   --  129* 134* 133* 134*  K 5.2*  --   --  5.3* 4.3 3.3* 3.1*  CL 97*  --   --  96* 99* 95* 96*  CO2 8*  --   --  8* 12* 20* 25  GLUCOSE 138*  --   --  111* 99 113* 109*  BUN 150*  --   --  191* 147* 73* 47*  CREATININE 21.99*  --   --  21.90* 16.55* 10.72* 6.60*  CALCIUM 5.6*  --   --  5.6* 5.6* 6.8* 8.0*  MG  --  2.7*  --  2.7*  --   --   --   PHOS  --   --  18.5* 18.1* 13.7* 7.7*  --    GFR: Estimated Creatinine Clearance: 11.5 mL/min (A) (by C-G formula based on SCr of 6.6 mg/dL (H)). Liver Function Tests:  Recent Labs Lab 11/20/16 1519 11/22/16 0446 11/23/16 1418 11/25/16 0554  AST 38  --   --  41  ALT 22  --   --  16  ALKPHOS 112  --   --  99  BILITOT 0.8  --   --  1.5*  PROT 6.5  --   --  5.7*  ALBUMIN 2.8* 2.6* 2.9* 3.0*    Recent Labs Lab 11/20/16 1519 11/23/16 1418  LIPASE 193* 178*    Recent Labs Lab 11/20/16 1519 11/21/16 1128 11/23/16 1418  AMMONIA 66* 69* 67*   Coagulation Profile:  Recent Labs Lab 11/20/16 1652 11/25/16 0554  INR 1.22 1.17   Cardiac Enzymes: No results for input(s): CKTOTAL, CKMB, CKMBINDEX, TROPONINI in the last 168 hours. BNP (last 3 results) No results for input(s): PROBNP in the last 8760 hours. HbA1C: No results for input(s): HGBA1C in the last 72 hours. CBG: No results for input(s): GLUCAP in the last 168 hours. Lipid Profile: No results for input(s): CHOL, HDL, LDLCALC, TRIG, CHOLHDL, LDLDIRECT in the last 72 hours. Thyroid Function Tests: No results for input(s): TSH, T4TOTAL, FREET4, T3FREE, THYROIDAB in the last 72 hours. Anemia Panel:  Recent Labs  11/22/16 1452  VITAMINB12 2,542*  FERRITIN 147  TIBC 262  IRON 31   Sepsis Labs: No results for input(s): PROCALCITON, LATICACIDVEN in the last 168  hours.  Recent Results (from the past 240 hour(s))  MRSA PCR Screening     Status: None   Collection Time: 11/20/16  9:45 PM  Result Value Ref Range Status   MRSA by PCR NEGATIVE NEGATIVE Final    Comment:        The GeneXpert MRSA Assay (FDA approved for NASAL specimens only), is one component of a comprehensive MRSA colonization surveillance program.  It is not intended to diagnose MRSA infection nor to guide or monitor treatment for MRSA infections.          Radiology Studies: Ct Abdomen Pelvis Wo Contrast  Result Date: 11/23/2016 CLINICAL DATA:  Acute onset of worsening abdominal distention. Initial encounter. EXAM: CT ABDOMEN AND PELVIS WITHOUT CONTRAST TECHNIQUE: Multidetector CT imaging of the abdomen and pelvis was performed following the standard protocol without IV contrast. COMPARISON:  CT of the abdomen and pelvis performed 10/30/2016 FINDINGS: Lower chest: Trace bilateral pleural fluid is noted. Diffuse coronary artery calcifications are seen. Hepatobiliary: The nodular contour of the liver is compatible with hepatic cirrhosis. No definite mass is characterized. Stones are seen within the gallbladder. The gallbladder is otherwise grossly unremarkable. The common bile duct remains normal in caliber. 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 Pancreas: The pancreas is within normal limits. Spleen: The spleen is unremarkable in appearance. Adrenals/Urinary Tract: The adrenal glands are unremarkable in appearance. Mild bilateral renal atrophy is noted. There is no evidence of hydronephrosis. No renal or ureteral stones are identified. No perinephric stranding is seen. Stomach/Bowel: The appendix is normal in caliber, without evidence of appendicitis. Vague wall thickening is noted along the ascending colon, concerning for infectious or inflammatory colitis. The remainder of the colon is grossly unremarkable. Contrast is noted within the small bowel. The small  bowel is grossly unremarkable in appearance. The stomach is grossly unremarkable in appearance. Vascular/Lymphatic: Scattered calcification is seen along the abdominal aorta and its branches. The abdominal aorta is otherwise grossly unremarkable. The inferior vena cava is grossly unremarkable. No retroperitoneal lymphadenopathy is seen. No pelvic sidewall lymphadenopathy is identified. Reproductive: The bladder is decompressed and not well assessed. The uterus is grossly unremarkable. The ovaries are relatively symmetric. No suspicious adnexal masses are seen. Other: Relatively large volume ascites is seen within the abdomen and pelvis, extending into a moderate umbilical hernia. Diffuse soft tissue edema is seen tracking along the abdominal and pelvic wall. Asymmetric edema and subcutaneous inflammation are noted at the left breast. Musculoskeletal: No acute osseous abnormalities are identified. The visualized musculature is unremarkable in appearance. IMPRESSION: 1. Vague wall thickening along the ascending colon, concerning for infectious or inflammatory colitis. 2. Relatively large volume ascites within the abdomen and pelvis, extending into a moderate umbilical hernia. 3. Diffuse soft tissue edema tracking along the abdominal and pelvic wall. 4. Asymmetric edema and subcutaneous inflammation noted at the left breast. This could reflect underlying systemic edema, though would correlate clinically to exclude inflammatory cancer of the left breast. 5. Findings of hepatic cirrhosis, with prominent splenic, gastric and right renal varices. 6. Trace bilateral pleural fluid. 7. Diffuse coronary artery calcifications seen. 8. Cholelithiasis.  Gallbladder otherwise unremarkable. 9. Mild bilateral renal atrophy noted. 10. Scattered aortic atherosclerosis. Electronically Signed   By: Garald Balding M.D.   On: 11/23/2016 18:08        Scheduled Meds: . calcium acetate  1,334 mg Oral With snacks  . calcium acetate   2,668 mg Oral TID WC  . famotidine  40 mg Oral BID  . folic acid  1 mg Oral Daily  . lactulose  10 g Oral TID  . midodrine  10 mg Oral Q T,Th,Sa-HD  . oxyCODONE  10 mg Oral TID   Continuous Infusions: . sodium chloride    . sodium chloride    . sodium chloride       LOS: 5 days    Time spent: 30 minutes  Loretha Stapler, MD [facility] Pager [phone]  If 7PM-7AM, please contact night-coverage [email or web address] Password [facility] 11/25/2016, 9:43 AM

## 2016-11-25 NOTE — Progress Notes (Signed)
Pt refusing all meds except pain meds, educated on importance of taking prescribed medications.

## 2016-11-25 NOTE — Care Management Note (Signed)
Case Management Note  Patient Details  Name: Wanda [name] MRN: [id number] Date of Birth: 28-Mar-1976  Subjective/Objective: Adm Abd pain/ascites/pulmonary edema. Has several HD sessions and paracentesis this visit. Pt has been recommended for SNF. Adamantly declines. Family at bedside during assessment. Pt. Declines home health services as well. States she has RW at home. Patient goes to OP HD at [facility] in [place]. States family drives her to appointments. However it is noted that patient has been skipping HD. Ongoing palliative conversations.   Action/Plan: Pt. Plans to return home at time of discharge.   Expected Discharge Date:       11/26/2016           Expected Discharge Plan:  Home/Self Care  In-House Referral:     Discharge planning Services  CM Consult  Post Acute Care Choice:  NA Choice offered to:  NA  DME Arranged:    DME Agency:     HH Arranged:    HH Agency:     Status of Service:  Completed, signed off  If discussed at [employer] of Stay Meetings, dates discussed:    Additional Comments:  Lamarkus Nebel, Chauncey Reading, RN 11/25/2016, 1:53 PM

## 2016-11-25 NOTE — Progress Notes (Signed)
Subjective:  Some nausea. No vomiting. BM yesterday soft, nonbloody. Diffuse abdominal pain stable. Complains of protonix IV and pill hurting her stomach. Eating breakfast. Going to work with PT today to try and walk.   Objective: Vital signs in last 24 hours: Temp:  [98 F (36.7 C)-98.4 F (36.9 C)] 98.1 F (36.7 C) (04/18 0646) Pulse Rate:  [78-88] 78 (04/18 0646) Resp:  [16-17] 16 (04/18 0646) BP: (86-106)/(42-59) 98/59 (04/18 0646) SpO2:  [100 %] 100 % (04/18 0646) Weight:  [166 lb 10.7 oz (75.6 kg)] 166 lb 10.7 oz (75.6 kg) (04/17 1040) Last BM Date: 11/24/16 General:   Alert,  Flat affect. Chronically ill-appearing. NAD. Making eye contact and more communicative this morning.  Head:  Normocephalic and atraumatic. Eyes:  Sclera clear, no icterus.  Abdomen:  Soft, significant pitting edema in dependent regions. umb hernia easily reducible and nontender. Normal bowel sounds, without guarding, and without rebound.   Extremities:  Without clubbing, deformity. 2-3+ pitting edema to upper thighs bilaterally. Neurologic:  Alert and  oriented x4;  grossly normal neurologically. Skin:  Intact without significant lesions or rashes. Psych:  Alert and cooperative. Flat affect. Cooperative.  Intake/Output from previous day: 04/17 0701 - 04/18 0700 In: 960 [P.O.:960] Out: 2800  Intake/Output this shift: No intake/output data recorded.  Lab Results: CBC  Recent Labs  11/23/16 1418 11/25/16 0554  WBC 7.8 8.2  HGB 8.0* 7.6*  HCT 24.0* 23.9*  MCV 95.6 98.4  PLT 59* 44*   BMET  Recent Labs  11/23/16 1418 11/25/16 0554  NA 133* 134*  K 3.3* 3.1*  CL 95* 96*  CO2 20* 25  GLUCOSE 113* 109*  BUN 73* 47*  CREATININE 10.72* 6.60*  CALCIUM 6.8* 8.0*   LFTs  Recent Labs  11/23/16 1418 11/25/16 0554  BILITOT  --  1.5*  BILIDIR  --  0.4  IBILI  --  1.1*  ALKPHOS  --  99  AST  --  41  ALT  --  16  PROT  --  5.7*  ALBUMIN 2.9* 3.0*    Recent Labs  11/23/16 1418   LIPASE 178*   PT/INR  Recent Labs  11/25/16 0554  LABPROT 14.9  INR 1.17      Imaging Studies: Ct Abdomen Pelvis Wo Contrast  Result Date: 11/23/2016 CLINICAL DATA:  Acute onset of worsening abdominal distention. Initial encounter. EXAM: CT ABDOMEN AND PELVIS WITHOUT CONTRAST TECHNIQUE: Multidetector CT imaging of the abdomen and pelvis was performed following the standard protocol without IV contrast. COMPARISON:  CT of the abdomen and pelvis performed 10/30/2016 FINDINGS: Lower chest: Trace bilateral pleural fluid is noted. Diffuse coronary artery calcifications are seen. Hepatobiliary: The nodular contour of the liver is compatible with hepatic cirrhosis. No definite mass is characterized. Stones are seen within the gallbladder. The gallbladder is otherwise grossly unremarkable. The common bile duct remains normal in caliber. 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 Pancreas: The pancreas is within normal limits. Spleen: The spleen is unremarkable in appearance. Adrenals/Urinary Tract: The adrenal glands are unremarkable in appearance. Mild bilateral renal atrophy is noted. There is no evidence of hydronephrosis. No renal or ureteral stones are identified. No perinephric stranding is seen. Stomach/Bowel: The appendix is normal in caliber, without evidence of appendicitis. Vague wall thickening is noted along the ascending colon, concerning for infectious or inflammatory colitis. The remainder of the colon is grossly unremarkable. Contrast is noted within the small bowel. The small bowel is grossly unremarkable in  appearance. The stomach is grossly unremarkable in appearance. Vascular/Lymphatic: Scattered calcification is seen along the abdominal aorta and its branches. The abdominal aorta is otherwise grossly unremarkable. The inferior vena cava is grossly unremarkable. No retroperitoneal lymphadenopathy is seen. No pelvic sidewall lymphadenopathy is identified. Reproductive:  The bladder is decompressed and not well assessed. The uterus is grossly unremarkable. The ovaries are relatively symmetric. No suspicious adnexal masses are seen. Other: Relatively large volume ascites is seen within the abdomen and pelvis, extending into a moderate umbilical hernia. Diffuse soft tissue edema is seen tracking along the abdominal and pelvic wall. Asymmetric edema and subcutaneous inflammation are noted at the left breast. Musculoskeletal: No acute osseous abnormalities are identified. The visualized musculature is unremarkable in appearance. IMPRESSION: 1. Vague wall thickening along the ascending colon, concerning for infectious or inflammatory colitis. 2. Relatively large volume ascites within the abdomen and pelvis, extending into a moderate umbilical hernia. 3. Diffuse soft tissue edema tracking along the abdominal and pelvic wall. 4. Asymmetric edema and subcutaneous inflammation noted at the left breast. This could reflect underlying systemic edema, though would correlate clinically to exclude inflammatory cancer of the left breast. 5. Findings of hepatic cirrhosis, with prominent splenic, gastric and right renal varices. 6. Trace bilateral pleural fluid. 7. Diffuse coronary artery calcifications seen. 8. Cholelithiasis.  Gallbladder otherwise unremarkable. 9. Mild bilateral renal atrophy noted. 10. Scattered aortic atherosclerosis. Electronically Signed   By: Garald Balding M.D.   On: 11/23/2016 18:08   Ct Abdomen Pelvis W Contrast  Result Date: 10/30/2016 CLINICAL DATA:  Diffuse abdominal pain. EXAM: CT ABDOMEN AND PELVIS WITH CONTRAST TECHNIQUE: Multidetector CT imaging of the abdomen and pelvis was performed using the standard protocol following bolus administration of intravenous contrast. CONTRAST:  16mL ISOVUE-300 IOPAMIDOL (ISOVUE-300) INJECTION 61%, 68mL ISOVUE-300 IOPAMIDOL (ISOVUE-300) INJECTION 61% COMPARISON:  CT abdomen pelvis 09/19/2016 FINDINGS: Lower chest: No pulmonary  nodules. No visible pleural or pericardial effusion. Hepatobiliary: There is hypertrophy of the left hepatic lobe and caudate with a diffusely nodular hepatic contour. There is a large amount of perihepatic ascites that tracks into the right paracolic gutter and pelvis. There is cholelithiasis. No focal liver lesions. The portal vein is patent. Pancreas: Normal pancreatic contours and enhancement. No peripancreatic fluid collection or pancreatic ductal dilatation. Spleen: The spleen is mildly enlarged. There is a large amount of perisplenic ascites tracking down the left pericolic gutter into the pelvis. Adrenals/Urinary Tract: Normal adrenal glands. Both kidneys are atrophic. There is a dilated vein connecting the right renal vein and superior mesenteric vein. Stomach/Bowel: No abnormal bowel dilatation. No bowel wall thickening or adjacent fat stranding to indicate acute inflammation. No abdominal fluid collection. Vascular/Lymphatic: There is atherosclerotic calcification of the non aneurysmal abdominal aorta. The portal vein, superior mesenteric vein and splenic vein are patent. No abdominal or pelvic adenopathy. Reproductive: Normal uterus and ovaries. Musculoskeletal: No lytic or blastic osseous lesion. Normal visualized extrathoracic and extraperitoneal soft tissues. Other: There is an umbilical hernia that is fluid filled. IMPRESSION: 1. Hepatic cirrhosis and findings of portal hypertension, including large volume ascites throughout the abdomen, extending along both paracolic gutters into the pelvis. 2. Cholelithiasis. 3. Aortic atherosclerosis. Electronically Signed   By: Ulyses Jarred M.D.   On: 10/30/2016 02:05   US Abdomen Limited  Result Date: 11/23/2016 CLINICAL DATA:  Ascites.  Evaluate for paracentesis. EXAM: LIMITED ABDOMEN ULTRASOUND FOR ASCITES TECHNIQUE: Limited ultrasound survey for ascites was performed in all four abdominal quadrants. COMPARISON:  Abdominal series 413 2018.  Ultrasound  10/30/2016. FINDINGS: Small pockets of ascites noted in both lower quadrants. Ascites volume not enough for safe paracentesis. Paracentesis not performed at this time . IMPRESSION: Small amount of ascites.  Paracentesis not performed at this time. Electronically Signed   By: Marcello Moores  Register   On: 11/23/2016 09:50   US Paracentesis  Result Date: 10/30/2016 INDICATION: Cirrhosis, ascites EXAM: ULTRASOUND GUIDED DIAGNOSTIC AND THERAPEUTIC PARACENTESIS MEDICATIONS: None. COMPLICATIONS: None immediate. PROCEDURE: Procedure, benefits, and risks of procedure were discussed with patient. Written informed consent for procedure was obtained. Time out protocol followed. Adequate collection of ascites localized by ultrasound in RIGHT lower quadrant. Skin prepped and draped in usual sterile fashion. Skin and soft tissues anesthetized with 10 mL of 1% lidocaine. 5 [place] Yueh catheter placed into peritoneal cavity. 2.7 L of dark yellow ascitic fluid aspirated by vacuum bottle suction. Procedure tolerated well by patient without immediate complication. FINDINGS: A total of approximately 2.7 L of ascitic fluid was removed. Samples were sent to the laboratory as requested by the clinical team. IMPRESSION: Successful ultrasound-guided paracentesis yielding 2.7 liters of peritoneal fluid. Electronically Signed   By: Lavonia Dana M.D.   On: 10/30/2016 11:11   Dg Chest Port 1 View  Result Date: 11/22/2016 CLINICAL DATA:  Pulmonary edema. EXAM: PORTABLE CHEST 1 VIEW COMPARISON:  Acute abdominal series 11/20/2016 FINDINGS: Heart is mildly enlarged. Interstitial edema is improved. Previously-seen upper lobe airspace consolidation is improved. The left base is not imaged. Atherosclerotic calcifications are present at the aortic arch. IMPRESSION: 1. Cardiomegaly with improving interstitial edema. Electronically Signed   By: San Morelle M.D.   On: 11/22/2016 10:30   Dg Abd Acute W/chest  Result Date: 11/20/2016 CLINICAL  DATA:  Abdominal pain. History of end-stage renal disease on dialysis, pneumonia, cirrhosis. EXAM: DG ABDOMEN ACUTE W/ 1V CHEST COMPARISON:  Chest radiograph October 09, 2016 FINDINGS: Increased diffuse interstitial and alveolar airspace opacities. Cardiac silhouette is mildly enlarged, unchanged. Calcified aortic knob. No pleural effusion. No pneumothorax. Hazy appearance of the abdomen suggests ascites. No intra-abdominal mass effect or pathologic calcification on this habitus limited examination. Bowel gas pattern is nondilated and nonobstructive. Soft tissue planes and included osseous structures are nonsuspicious. IMPRESSION: Interstitial and alveolar airspace opacities concerning for multifocal pneumonia, possible underlying pulmonary edema. Mild cardiomegaly. Probable ascites.  Nonspecific bowel gas pattern. Electronically Signed   By: Elon Alas M.D.   On: 11/20/2016 16:19  [2 weeks]   Assessment: 41 year old female with decompensated NASH cirrhosis (MELD Na 25 on admission and today), presenting with acute on chronic abdominal pain, ESRD non-compliant with outpatient dialysis, and acute on chronic anemia without overt GI bleeding.   Abdominal pain: doubt SBP. Patient has had multiple CTs due to abdominal pain. Recent admission with fluid analysis negative for SBP and similar symptom on this admission. US paracentesis attempted this admission but insufficient volume to safely tap. Notable anasarca on exam as well. Chronically elevated lipase non-specific in setting of advanced renal disease. No CT evidence of pancreatitis on prior imaging with contrast (CT this admission without contrast). Suspected abd pain secondary to subcutaneous edema?  Colitis on CT: clinically without symptoms of colitis. CT reviewd with radiologist per Dr. Oneida Alar and no evidence of colitis. limited ascites. Recommended discontinue Zosyn.   Anemia: acute on chronic without overt GI bleeding. Received 3 units PRBCs  this admission. Multifactorial. Recent EGD on file from Oct 2017.   Patient's prognosis is grim; she has been non-compliant with recommendations historically and has missed several  outpatient dialysis visits.  I appreciate palliative care involvement. Options are limited for care other than supportive measures  Plan: 1. Change pantoprazole to Pepcid 40mg  bid. Patient reports abd pain to pantoprazole.   2. Continue supportive measures.  Laureen Ochs. Bernarda Caffey [facility] [phone] 4/18/20189:14 AM     LOS: 5 days

## 2016-11-26 DIAGNOSIS — Z91199 Patient's noncompliance with other medical treatment and regimen due to unspecified reason: Secondary | ICD-10-CM

## 2016-11-26 DIAGNOSIS — Z9119 Patient's noncompliance with other medical treatment and regimen: Secondary | ICD-10-CM

## 2016-11-26 LAB — CBC WITH DIFFERENTIAL/PLATELET
Basophils Absolute: 0.1 10*3/uL (ref 0.0–0.1)
Basophils Relative: 1 %
EOS PCT: 3 %
Eosinophils Absolute: 0.3 10*3/uL (ref 0.0–0.7)
HCT: 24.9 % — ABNORMAL LOW (ref 36.0–46.0)
Hemoglobin: 7.9 g/dL — ABNORMAL LOW (ref 12.0–15.0)
LYMPHS ABS: 0.6 10*3/uL — AB (ref 0.7–4.0)
LYMPHS PCT: 7 %
MCH: 31.5 pg (ref 26.0–34.0)
MCHC: 31.7 g/dL (ref 30.0–36.0)
MCV: 99.2 fL (ref 78.0–100.0)
MONO ABS: 1.1 10*3/uL — AB (ref 0.1–1.0)
Monocytes Relative: 13 %
Neutro Abs: 6.4 10*3/uL (ref 1.7–7.7)
Neutrophils Relative %: 76 %
PLATELETS: 49 10*3/uL — AB (ref 150–400)
RBC: 2.51 MIL/uL — AB (ref 3.87–5.11)
RDW: 18.9 % — AB (ref 11.5–15.5)
WBC: 8.4 10*3/uL (ref 4.0–10.5)

## 2016-11-26 LAB — BASIC METABOLIC PANEL
Anion gap: 15 (ref 5–15)
BUN: 61 mg/dL — AB (ref 6–20)
CHLORIDE: 95 mmol/L — AB (ref 101–111)
CO2: 23 mmol/L (ref 22–32)
Calcium: 8 mg/dL — ABNORMAL LOW (ref 8.9–10.3)
Creatinine, Ser: 7.73 mg/dL — ABNORMAL HIGH (ref 0.44–1.00)
GFR calc Af Amer: 7 mL/min — ABNORMAL LOW (ref >60)
GFR calc non Af Amer: 6 mL/min — ABNORMAL LOW (ref >60)
GLUCOSE: 115 mg/dL — AB (ref 65–99)
POTASSIUM: 2.9 mmol/L — AB (ref 3.5–5.1)
Sodium: 133 mmol/L — ABNORMAL LOW (ref 135–145)

## 2016-11-26 MED ORDER — ALBUMIN HUMAN 25 % IV SOLN
25.0000 g | Freq: Once | INTRAVENOUS | Status: AC
  Administered 2016-11-26: 25 g via INTRAVENOUS
  Filled 2016-11-26: qty 100

## 2016-11-26 MED ORDER — CALCIUM ACETATE (PHOS BINDER) 667 MG PO CAPS: 2668.0000 mg | ORAL_CAPSULE | Freq: Three times a day (TID) | ORAL | 0 refills | Status: AC

## 2016-11-26 MED ORDER — CALCIUM ACETATE (PHOS BINDER) 667 MG PO CAPS
1334.0000 mg | ORAL_CAPSULE | ORAL | 0 refills | Status: AC
Start: 2016-11-26 — End: 2016-12-11

## 2016-11-26 NOTE — Care Management Note (Signed)
Case Management Note  Patient Details  Name: Wanda [name] MRN: [id number] Date of Birth: [birth year]  If discussed at Long Length of Stay Meetings, dates discussed:  11/26/2016  Additional Comments:  Hester Forget, Chauncey Reading, RN 11/26/2016, 10:24 AM

## 2016-11-26 NOTE — Discharge Summary (Signed)
Physician Discharge Summary  Wanda [name] VZD:[id number] DOB: [birth year] DOA: 11/20/2016  PCP: [name], MD  Admit date: 11/20/2016 Discharge date: 11/26/2016  Admitted From: Home  Disposition:   Home   Recommendations for Outpatient Follow-up:  1. Follow up with PCP in 1-2 weeks 2. Follow up with Saturday dialysis 3. Schedule follow up with your Nephrologist and Gastroenterology 4. Please obtain BMP/CBC in one week 5. Take medications as prescribed  Home Health: No- patient refused HH as well as SNF placement Equipment/Devices: None  Discharge Condition: Stable but guarded CODE STATUS: Full code Diet recommendation: Renal diet  Brief/Interim Summary: Wanda Persad Crowderis a 41 y.o.femalewith a history of ESRD on dialysis, NASH with cirrhosis and ascites, depression, bipolar, depression, chronic pain, h/o esophageal varices with bleeding, presents with abdominal pain since 3 weeks, and sob , last HD was 3 weeks ago. On arrival to she was found to be in pulmonary edema. She was admitted for further evaluation. Nephrology consulted and recommendations given. Gastroenterology consulted secondary to abdominal pain and concern for SBP.  Paracentesis was attempted however too little fluid obtained to analyze.  She was seen by nephrology and dialyzed.  She was evaluated by physical therapy whom suggested SNF but patient refused; of note patient also refused home health.  During her hospitalization she intermittently refused her medications.  She was seen by palliative care but would not engage in conversation about advanced directive.  Discharge Diagnoses:  Principal Problem:   Abdominal pain Active Problems:   Liver cirrhosis secondary to nonalcoholic steatohepatitis (NASH) (HCC)   End stage renal disease on dialysis (HCC)   Ascites of liver   GI bleed   Acute blood loss anemia   Pulmonary edema   Hypocalcemia   Palliative care encounter   Liver cirrhosis secondary to NASH  (nonalcoholic steatohepatitis) ([place])   History of noncompliance with medical treatment    Discharge Instructions  Discharge Instructions    Call MD for:  difficulty breathing, headache or visual disturbances    Complete by:  As directed    Call MD for:  extreme fatigue    Complete by:  As directed    Call MD for:  hives    Complete by:  As directed    Call MD for:  persistant dizziness or light-headedness    Complete by:  As directed    Call MD for:  persistant nausea and vomiting    Complete by:  As directed    Call MD for:  severe uncontrolled pain    Complete by:  As directed    Call MD for:  temperature >100.4    Complete by:  As directed    Diet - low sodium heart healthy    Complete by:  As directed    Discharge instructions    Complete by:  As directed    Dialysis schedule of Tuesday, Thursday, Saturday Schedule follow up with Dr. Oneida Alar Schedule follow up with your PCP   Increase activity slowly    Complete by:  As directed      Allergies as of 11/26/2016      Reactions   Iohexol Rash   Pt states rash after last CT. Pt needs pre-meds   Lasix [furosemide] Other (See Comments)   "doesn't work"   Latex Itching      Medication List    TAKE these medications   calcium acetate 667 MG capsule Commonly known as:  PHOSLO Take 4 capsules (2,668 mg total) by mouth 3 (  three) times daily with meals.   calcium acetate 667 MG capsule Commonly known as:  PHOSLO Take 2 capsules (1,334 mg total) by mouth with snacks.   clotrimazole 1 % cream Commonly known as:  LOTRIMIN Apply 1 application topically 3 (three) times daily.   Darbepoetin Alfa 60 MCG/0.3ML Sosy injection Commonly known as:  ARANESP Inject 0.3 mLs (60 mcg total) into the vein every Thursday with hemodialysis. What changed:  when to take this   lactulose 10 GM/15ML solution Commonly known as:  CHRONULAC Take 45 mLs (30 g total) by mouth 3 (three) times daily. TAKE 15-30 ml BY MOUTH three times a  day What changed:  how much to take  additional instructions   lidocaine 2 % solution Commonly known as:  XYLOCAINE TAKE TWO TEASPOONSFUL (10ML) BY MOUTH BEFORE MEALS AND AT BEDTIME TO PREVENT CHEST PAIN WHILE EATING   lidocaine-prilocaine cream Commonly known as:  EMLA Apply 1 application topically as needed (for dialysis treatments).   LORazepam 1 MG tablet Commonly known as:  ATIVAN Take 1 mg by mouth 2 (two) times daily as needed for anxiety (and/or back spasms).   midodrine 10 MG tablet Commonly known as:  PROAMATINE Take 1 tablet (10 mg total) by mouth every morning. What changed:  when to take this  additional instructions   omeprazole 20 MG capsule Commonly known as:  PRILOSEC 1 po every morning 30 minutes prior to your first meal. What changed:  how much to take  how to take this  when to take this  additional instructions   Oxycodone HCl 10 MG Tabs Take 0.5 tablets (5 mg total) by mouth 3 (three) times daily as needed. 1 PO TID AS NEEDED FOR PAIN What changed:  how much to take  additional instructions   RENVELA 800 MG tablet Generic drug:  sevelamer carbonate Take 1600 mg by mouth 3 times daily with meals. Take 800 mg by mouth with snacks.   sucralfate 1 GM/10ML suspension Commonly known as:  CARAFATE Take 1 g by mouth 4 (four) times daily -  with meals and at bedtime.      Follow-up Information    [facility] S, MD. Schedule an appointment as soon as possible for a visit in 2 week(s).   Specialty:  Nephrology Contact information: [address] [address] [phone]        Barney Drain, MD. Schedule an appointment as soon as possible for a visit in 2 week(s).   Specialty:  Gastroenterology Contact information: [place] [address] [phone]        [name], MD. Schedule an appointment as soon as possible for a visit in 1 week(s).   Specialty:  Nurse  Practitioner Contact information: [address] [place] [address] [phone]          Allergies  Allergen Reactions  . Iohexol Rash    Pt states rash after last CT. Pt needs pre-meds  . Lasix [Furosemide] Other (See Comments)    "doesn't work"  . Latex Itching    Consultations:  Nephrology  Gastroenterology  Palliative Care   Procedures/Studies: Ct Abdomen Pelvis Wo Contrast  Result Date: 11/23/2016 CLINICAL DATA:  Acute onset of worsening abdominal distention. Initial encounter. EXAM: CT ABDOMEN AND PELVIS WITHOUT CONTRAST TECHNIQUE: Multidetector CT imaging of the abdomen and pelvis was performed following the standard protocol without IV contrast. COMPARISON:  CT of the abdomen and pelvis performed 10/30/2016 FINDINGS: Lower chest: Trace bilateral pleural fluid is  noted. Diffuse coronary artery calcifications are seen. Hepatobiliary: The nodular contour of the liver is compatible with hepatic cirrhosis. No definite mass is characterized. Stones are seen within the gallbladder. The gallbladder is otherwise grossly unremarkable. The common bile duct remains normal in caliber. 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 Pancreas: The pancreas is within normal limits. Spleen: The spleen is unremarkable in appearance. Adrenals/Urinary Tract: The adrenal glands are unremarkable in appearance. Mild bilateral renal atrophy is noted. There is no evidence of hydronephrosis. No renal or ureteral stones are identified. No perinephric stranding is seen. Stomach/Bowel: The appendix is normal in caliber, without evidence of appendicitis. Vague wall thickening is noted along the ascending colon, concerning for infectious or inflammatory colitis. The remainder of the colon is grossly unremarkable. Contrast is noted within the small bowel. The small bowel is grossly unremarkable in appearance. The stomach is grossly unremarkable in appearance. Vascular/Lymphatic: Scattered  calcification is seen along the abdominal aorta and its branches. The abdominal aorta is otherwise grossly unremarkable. The inferior vena cava is grossly unremarkable. No retroperitoneal lymphadenopathy is seen. No pelvic sidewall lymphadenopathy is identified. Reproductive: The bladder is decompressed and not well assessed. The uterus is grossly unremarkable. The ovaries are relatively symmetric. No suspicious adnexal masses are seen. Other: Relatively large volume ascites is seen within the abdomen and pelvis, extending into a moderate umbilical hernia. Diffuse soft tissue edema is seen tracking along the abdominal and pelvic wall. Asymmetric edema and subcutaneous inflammation are noted at the left breast. Musculoskeletal: No acute osseous abnormalities are identified. The visualized musculature is unremarkable in appearance. IMPRESSION: 1. Vague wall thickening along the ascending colon, concerning for infectious or inflammatory colitis. 2. Relatively large volume ascites within the abdomen and pelvis, extending into a moderate umbilical hernia. 3. Diffuse soft tissue edema tracking along the abdominal and pelvic wall. 4. Asymmetric edema and subcutaneous inflammation noted at the left breast. This could reflect underlying systemic edema, though would correlate clinically to exclude inflammatory cancer of the left breast. 5. Findings of hepatic cirrhosis, with prominent splenic, gastric and right renal varices. 6. Trace bilateral pleural fluid. 7. Diffuse coronary artery calcifications seen. 8. Cholelithiasis.  Gallbladder otherwise unremarkable. 9. Mild bilateral renal atrophy noted. 10. Scattered aortic atherosclerosis. Electronically Signed   By: Garald Balding M.D.   On: 11/23/2016 18:08   Ct Abdomen Pelvis W Contrast  Result Date: 10/30/2016 CLINICAL DATA:  Diffuse abdominal pain. EXAM: CT ABDOMEN AND PELVIS WITH CONTRAST TECHNIQUE: Multidetector CT imaging of the abdomen and pelvis was performed using  the standard protocol following bolus administration of intravenous contrast. CONTRAST:  71mL ISOVUE-300 IOPAMIDOL (ISOVUE-300) INJECTION 61%, 37mL ISOVUE-300 IOPAMIDOL (ISOVUE-300) INJECTION 61% COMPARISON:  CT abdomen pelvis 09/19/2016 FINDINGS: Lower chest: No pulmonary nodules. No visible pleural or pericardial effusion. Hepatobiliary: There is hypertrophy of the left hepatic lobe and caudate with a diffusely nodular hepatic contour. There is a large amount of perihepatic ascites that tracks into the right paracolic gutter and pelvis. There is cholelithiasis. No focal liver lesions. The portal vein is patent. Pancreas: Normal pancreatic contours and enhancement. No peripancreatic fluid collection or pancreatic ductal dilatation. Spleen: The spleen is mildly enlarged. There is a large amount of perisplenic ascites tracking down the left pericolic gutter into the pelvis. Adrenals/Urinary Tract: Normal adrenal glands. Both kidneys are atrophic. There is a dilated vein connecting the right renal vein and superior mesenteric vein. Stomach/Bowel: No abnormal bowel dilatation. No bowel wall thickening or adjacent fat stranding to indicate  acute inflammation. No abdominal fluid collection. Vascular/Lymphatic: There is atherosclerotic calcification of the non aneurysmal abdominal aorta. The portal vein, superior mesenteric vein and splenic vein are patent. No abdominal or pelvic adenopathy. Reproductive: Normal uterus and ovaries. Musculoskeletal: No lytic or blastic osseous lesion. Normal visualized extrathoracic and extraperitoneal soft tissues. Other: There is an umbilical hernia that is fluid filled. IMPRESSION: 1. Hepatic cirrhosis and findings of portal hypertension, including large volume ascites throughout the abdomen, extending along both paracolic gutters into the pelvis. 2. Cholelithiasis. 3. Aortic atherosclerosis. Electronically Signed   By: Ulyses Jarred M.D.   On: 10/30/2016 02:05   US Abdomen  Limited  Result Date: 11/23/2016 CLINICAL DATA:  Ascites.  Evaluate for paracentesis. EXAM: LIMITED ABDOMEN ULTRASOUND FOR ASCITES TECHNIQUE: Limited ultrasound survey for ascites was performed in all four abdominal quadrants. COMPARISON:  Abdominal series 413 2018.  Ultrasound 10/30/2016. FINDINGS: Small pockets of ascites noted in both lower quadrants. Ascites volume not enough for safe paracentesis. Paracentesis not performed at this time . IMPRESSION: Small amount of ascites.  Paracentesis not performed at this time. Electronically Signed   By: Marcello Moores  Register   On: 11/23/2016 09:50   US Paracentesis  Result Date: 10/30/2016 INDICATION: Cirrhosis, ascites EXAM: ULTRASOUND GUIDED DIAGNOSTIC AND THERAPEUTIC PARACENTESIS MEDICATIONS: None. COMPLICATIONS: None immediate. PROCEDURE: Procedure, benefits, and risks of procedure were discussed with patient. Written informed consent for procedure was obtained. Time out protocol followed. Adequate collection of ascites localized by ultrasound in RIGHT lower quadrant. Skin prepped and draped in usual sterile fashion. Skin and soft tissues anesthetized with 10 mL of 1% lidocaine. 5 [place] Yueh catheter placed into peritoneal cavity. 2.7 L of dark yellow ascitic fluid aspirated by vacuum bottle suction. Procedure tolerated well by patient without immediate complication. FINDINGS: A total of approximately 2.7 L of ascitic fluid was removed. Samples were sent to the laboratory as requested by the clinical team. IMPRESSION: Successful ultrasound-guided paracentesis yielding 2.7 liters of peritoneal fluid. Electronically Signed   By: Lavonia Dana M.D.   On: 10/30/2016 11:11   Dg Chest Port 1 View  Result Date: 11/22/2016 CLINICAL DATA:  Pulmonary edema. EXAM: PORTABLE CHEST 1 VIEW COMPARISON:  Acute abdominal series 11/20/2016 FINDINGS: Heart is mildly enlarged. Interstitial edema is improved. Previously-seen upper lobe airspace consolidation is improved. The left base  is not imaged. Atherosclerotic calcifications are present at the aortic arch. IMPRESSION: 1. Cardiomegaly with improving interstitial edema. Electronically Signed   By: San Morelle M.D.   On: 11/22/2016 10:30   Dg Abd Acute W/chest  Result Date: 11/20/2016 CLINICAL DATA:  Abdominal pain. History of end-stage renal disease on dialysis, pneumonia, cirrhosis. EXAM: DG ABDOMEN ACUTE W/ 1V CHEST COMPARISON:  Chest radiograph October 09, 2016 FINDINGS: Increased diffuse interstitial and alveolar airspace opacities. Cardiac silhouette is mildly enlarged, unchanged. Calcified aortic knob. No pleural effusion. No pneumothorax. Hazy appearance of the abdomen suggests ascites. No intra-abdominal mass effect or pathologic calcification on this habitus limited examination. Bowel gas pattern is nondilated and nonobstructive. Soft tissue planes and included osseous structures are nonsuspicious. IMPRESSION: Interstitial and alveolar airspace opacities concerning for multifocal pneumonia, possible underlying pulmonary edema. Mild cardiomegaly. Probable ascites.  Nonspecific bowel gas pattern. Electronically Signed   By: Elon Alas M.D.   On: 11/20/2016 16:19        Subjective: Patient seen this morning.  She would not engage in conversation but her significant other voices that they understand patient needs to go to dialysis on Saturday (2 days from  discharge) and patient does not need home health services.  Discharge Exam: Vitals:   11/26/16 1408 11/26/16 1507  BP: 125/60 (!) 101/47  Pulse: 89 94  Resp: 20 18  Temp: 98.4 F (36.9 C) 98.5 F (36.9 C)   Vitals:   11/26/16 1359 11/26/16 1405 11/26/16 1408 11/26/16 1507  BP: 122/61 (!) 126/43 125/60 (!) 101/47  Pulse: 89 88 89 94  Resp:   20 18  Temp:   98.4 F (36.9 C) 98.5 F (36.9 C)  TempSrc:   Oral Oral  SpO2:    100%  Weight:      Height:        General: Pt is alert, awake, not in acute distress Cardiovascular: RRR, S1/S2 +, no  rubs, no gallops Respiratory: CTA bilaterally, no wheezing, no rhonchi Abdominal: Soft, NT, ND, bowel sounds + Extremities: 3+ edema bilateral lower extremity edema, no cyanosis    The results of significant diagnostics from this hospitalization (including imaging, microbiology, ancillary and laboratory) are listed below for reference.     Microbiology: Recent Results (from the past 240 hour(s))  MRSA PCR Screening     Status: None   Collection Time: 11/20/16  9:45 PM  Result Value Ref Range Status   MRSA by PCR NEGATIVE NEGATIVE Final    Comment:        The GeneXpert MRSA Assay (FDA approved for NASAL specimens only), is one component of a comprehensive MRSA colonization surveillance program. It is not intended to diagnose MRSA infection nor to guide or monitor treatment for MRSA infections.      Labs: BNP (last 3 results)  Recent Labs  05/19/16 1510  BNP 76.7   Basic Metabolic Panel:  Recent Labs Lab 11/20/16 1536 11/20/16 1702 11/21/16 1128 11/22/16 0446 11/23/16 1418 11/25/16 0554 11/26/16 0537  NA  --   --  129* 134* 133* 134* 133*  K  --   --  5.3* 4.3 3.3* 3.1* 2.9*  CL  --   --  96* 99* 95* 96* 95*  CO2  --   --  8* 12* 20* 25 23  GLUCOSE  --   --  111* 99 113* 109* 115*  BUN  --   --  191* 147* 73* 47* 61*  CREATININE  --   --  21.90* 16.55* 10.72* 6.60* 7.73*  CALCIUM  --   --  5.6* 5.6* 6.8* 8.0* 8.0*  MG 2.7*  --  2.7*  --   --   --   --   PHOS  --  18.5* 18.1* 13.7* 7.7*  --   --    Liver Function Tests:  Recent Labs Lab 11/20/16 1519 11/22/16 0446 11/23/16 1418 11/25/16 0554  AST 38  --   --  41  ALT 22  --   --  16  ALKPHOS 112  --   --  99  BILITOT 0.8  --   --  1.5*  PROT 6.5  --   --  5.7*  ALBUMIN 2.8* 2.6* 2.9* 3.0*    Recent Labs Lab 11/20/16 1519 11/23/16 1418  LIPASE 193* 178*    Recent Labs Lab 11/20/16 1519 11/21/16 1128 11/23/16 1418  AMMONIA 66* 69* 67*   CBC:  Recent Labs Lab 11/20/16 1519  11/21/16 1128 11/22/16 0446 11/23/16 1418 11/25/16 0554 11/26/16 0537  WBC 9.5 7.7 7.3 7.8 8.2 8.4  NEUTROABS 8.6*  --   --   --   --  6.4  HGB 6.8*  8.7* 7.9* 8.0* 7.6* 7.9*  HCT 19.8* 25.2* 24.4* 24.0* 23.9* 24.9*  MCV 98.0 93.0 94.9 95.6 98.4 99.2  PLT 138* 110* 76* 59* 44* 49*   Cardiac Enzymes: No results for input(s): CKTOTAL, CKMB, CKMBINDEX, TROPONINI in the last 168 hours. BNP: Invalid input(s): POCBNP CBG: No results for input(s): GLUCAP in the last 168 hours. D-Dimer No results for input(s): DDIMER in the last 72 hours. Hgb A1c No results for input(s): HGBA1C in the last 72 hours. Lipid Profile No results for input(s): CHOL, HDL, LDLCALC, TRIG, CHOLHDL, LDLDIRECT in the last 72 hours. Thyroid function studies No results for input(s): TSH, T4TOTAL, T3FREE, THYROIDAB in the last 72 hours.  Invalid input(s): FREET3 Anemia work up No results for input(s): VITAMINB12, FOLATE, FERRITIN, TIBC, IRON, RETICCTPCT in the last 72 hours. Urinalysis    Component Value Date/Time   COLORURINE RED (A) 09/23/2016 0805   APPEARANCEUR CLOUDY (A) 09/23/2016 0805   LABSPEC 1.015 09/23/2016 0805   PHURINE 6.5 09/23/2016 0805   GLUCOSEU NEGATIVE 09/23/2016 0805   HGBUR LARGE (A) 09/23/2016 0805   BILIRUBINUR MODERATE (A) 09/23/2016 0805   KETONESUR 15 (A) 09/23/2016 0805   PROTEINUR 100 (A) 09/23/2016 0805   UROBILINOGEN 0.2 08/13/2014 2138   NITRITE POSITIVE (A) 09/23/2016 0805   LEUKOCYTESUR LARGE (A) 09/23/2016 0805   Sepsis Labs Invalid input(s): PROCALCITONIN,  WBC,  LACTICIDVEN Microbiology Recent Results (from the past 240 hour(s))  MRSA PCR Screening     Status: None   Collection Time: 11/20/16  9:45 PM  Result Value Ref Range Status   MRSA by PCR NEGATIVE NEGATIVE Final    Comment:        The GeneXpert MRSA Assay (FDA approved for NASAL specimens only), is one component of a comprehensive MRSA colonization surveillance program. It is not intended to diagnose  MRSA infection nor to guide or monitor treatment for MRSA infections.      Time coordinating discharge: 35 minutes  SIGNED:   Loretha Stapler, MD  [facility] 11/26/2016, 3:16 PM Pager [phone] If 7PM-7AM, please contact night-coverage [email or web address] Password [email or web address]

## 2016-11-26 NOTE — Progress Notes (Signed)
Discharge instructions read to patient and her significant other.  Both verbalized understanding of all instructions. Discharged to home with significant other

## 2016-11-26 NOTE — Progress Notes (Signed)
the is a Subjective: Interval History: Feels week as other wise feels ok.No nausea or vomiting  Objective: Vital signs in last 24 hours: Temp:  [98.6 F (37 C)-99 F (37.2 C)] 99 F (37.2 C) (04/19 0500) Pulse Rate:  [81-98] 87 (04/19 0500) Resp:  [16-18] 16 (04/19 0500) BP: (102-108)/(43-55) 104/43 (04/19 0500) SpO2:  [98 %-100 %] 100 % (04/19 0500) Weight change:   Intake/Output from previous day: 04/18 0701 - 04/19 0700 In: 720 [P.O.:720] Out: -  Intake/Output this shift: No intake/output data recorded.  Generally patient is alert and in upper and distress Chest decreased breath sounds bilaterally Heart exam regular rate and rhythm Abdomen: Distended, nontender. Positive bowel sounds Extremities she has about 1-2+ edema  Lab Results:  Recent Labs  11/25/16 0554 11/26/16 0537  WBC 8.2 8.4  HGB 7.6* 7.9*  HCT 23.9* 24.9*  PLT 44* 49*   BMET:   Recent Labs  11/25/16 0554 11/26/16 0537  NA 134* 133*  K 3.1* 2.9*  CL 96* 95*  CO2 25 23  GLUCOSE 109* 115*  BUN 47* 61*  CREATININE 6.60* 7.73*  CALCIUM 8.0* 8.0*   No results for input(s): PTH in the last 72 hours. Iron Studies:  No results for input(s): IRON, TIBC, TRANSFERRIN, FERRITIN in the last 72 hours.  Studies/Results: No results found.  I have reviewed the patient's current medications.  Assessment/Plan: Problem #1 anasarca: Patient is still with significant edema and ascites but denies any difficulty in breathing Problem #2 end-stage renal disease/uremia. Patient presently denies any nausea or vomiting. Her appetite is improving Problem #3 metabolic acidosis: Her CO2 has improved Problem #4 metabolic bone disease: Calcium is very low however her phosphorus is very high. Most her calcium and phosphorus is improving. Presently patient is on binder. Problem #5 anemia: She is status post blood transfusion. Due to GI bleeding. Presently denies any more bleeding Problem #6 liver cirrhosis Problem#7  Hypokalemia: Her potassium is low Plan: 1] will dialyze her for 4 hours . 4k/ bath,We'll use albumin and low dialysate temperature and possibly remove part 3 1/2L if her blood pressure tolerates. 2] will check her renal panel and CBC in the morning    LOS: 6 days   Khloey Chern S 11/26/2016,8:08 AM

## 2016-11-26 NOTE — Progress Notes (Signed)
Daily Progress Note   Patient Name: Wanda [name]       Date: 11/26/2016 DOB: 1975/10/04  Age: 41 y.o. MRN#: [id number] Attending Physician: Eber Jones, MD Primary Care Physician: [name], MD Admit Date: 11/20/2016  Reason for Consultation/Follow-up: Establishing goals of care and Psychosocial/spiritual support  Subjective: Wanda [name] is lying quietly in bed. She does not make eye contact or speak to me until I call her name. She is having hemodialysis treatment in her room at this time. Wanda [name] states that her significant other, Wanda [name] has gone to run some errands but he will be back. I share with her nephrology's decision that if she misses 2 hemodialysis sessions they will no longer be able to serve her. She makes no statement. We talk about the future for her, and I share that when she no longer wants to take dialysis she can stop. Ask if anyone is ever shared this with her and she makes no response. I share briefly in simple terms with that would look like if she decides to not continue dialysis.  We talk about getting help at home, that she has declined SNF. Wanda [name] states that her goal is to uncomplicated things, that she wants to focus only on getting to dialysis. I share with her that it's important that she makes a plan for transportation to dialysis on Saturday. Wanda [name] states that her significant other, Wanda [name] takes her to dialysis.  Wanda [name] is unwilling to continue any conversation with me regarding complex issues. She seems subdued, depressed. We do not talk about complex issues such as healthcare power of attorney or advanced directives. She has, unfortunately, had 5 hospitalizations in the last 6 months. I anticipate continued further  decline, but Wanda [name] is unwilling/unable to discuss difficult choices.  Length of Stay: 6  Current Medications: Scheduled Meds:  . calcium acetate  1,334 mg Oral With snacks  . calcium acetate  2,668 mg Oral TID WC  . famotidine  40 mg Oral Daily  . folic acid  1 mg Oral Daily  . lactulose  10 g Oral TID  . midodrine  10 mg Oral Q T,Th,Sa-HD  . oxyCODONE  10 mg Oral TID  . potassium chloride  30 mEq Oral Once    Continuous Infusions: . sodium  chloride    . sodium chloride    . sodium chloride      PRN Meds: sodium chloride, sodium chloride, lactulose, lidocaine (PF), lidocaine-prilocaine, LORazepam, ondansetron (ZOFRAN) IV, pentafluoroprop-tetrafluoroeth, white petrolatum  Physical Exam  Constitutional: She is oriented to person, place, and time. No distress.  Briefly makes but does not keep eye contact, mostly keeps as close during our conversation.  HENT:  Head: Normocephalic and atraumatic.  Cardiovascular: Normal rate and regular rhythm.   Pulmonary/Chest: Effort normal. No respiratory distress.  Abdominal: She exhibits distension. There is tenderness.  Tight abdomen  Musculoskeletal: She exhibits edema.  Neurological: She is alert and oriented to person, place, and time.  Skin: Skin is warm.  Nursing note and vitals reviewed.           Vital Signs: BP (!) 126/59   Pulse 88   Temp 99 F (37.2 C) (Oral)   Resp 18   Ht 5\' 6"  (1.676 m)   Wt 75.6 kg (166 lb 10.7 oz)   LMP 09/21/2013   SpO2 98%   BMI 26.90 kg/m  SpO2: SpO2: 98 % O2 Device: O2 Device: Not Delivered O2 Flow Rate:    Intake/output summary:  Intake/Output Summary (Last 24 hours) at 11/26/16 1301 Last data filed at 11/26/16 1253  Gross per 24 hour  Intake             1200 ml  Output                0 ml  Net             1200 ml   LBM: Last BM Date: 11/25/16 Baseline Weight: Weight: 66.2 kg (146 lb) Most recent weight: Weight: 75.6 kg (166 lb 10.7 oz)       Palliative  Assessment/Data:    Flowsheet Rows     Most Recent Value  Intake Tab  Referral Department  Hospitalist  Unit at Time of Referral  ICU  Palliative Care Primary Diagnosis  Nephrology  Date Notified  11/23/16  Palliative Care Type  New Palliative care  Reason for referral  Advance Care Planning, Clarify Goals of Care  Date of Admission  11/20/16  Date first seen by Palliative Care  11/23/16  # of days Palliative referral response time  0 Day(s)  # of days IP prior to Palliative referral  3  Clinical Assessment  Palliative Performance Scale Score  40%  Pain Max last 24 hours  Not able to report  Pain Min Last 24 hours  Not able to report  Dyspnea Max Last 24 Hours  Not able to report  Dyspnea Min Last 24 hours  Not able to report  Psychosocial & Spiritual Assessment  Palliative Care Outcomes  Patient/Family meeting held?  Yes  Who was at the meeting?  Patient only today  Palliative Care Outcomes  Provided psychosocial or spiritual support, Provided advance care planning      Patient Active Problem List   Diagnosis Date Noted  . Liver cirrhosis secondary to NASH (nonalcoholic steatohepatitis) ([place])   . Palliative care encounter   . Pulmonary edema 11/20/2016  . Hypocalcemia 11/20/2016  . Cardiomyopathy- ? ischemic  10/09/2016  . Troponin level elevated 10/09/2016  . Goals of care, counseling/discussion   . Palliative care by specialist   . Seizure ([place]) 10/07/2016  . Pressure injury of skin 05/20/2016  . Shock ([place]) 05/19/2016  . Rhonchi   . Chronic pain syndrome 08/22/2015  . Abdominal pain 08/07/2015  .  Umbilical hernia without obstruction and without gangrene 08/07/2015  . GI bleed   . Acute blood loss anemia   . Hypotension 06/05/2015  . Edema of left lower extremity 02/22/2015  . Ascites of liver   . History of esophageal varices with bleeding 09/16/2014  . End stage renal disease on dialysis ([place])   . Anemia of chronic disease   . Thrombocytopenia ([place])  08/19/2014  . Hyponatremia 08/17/2014  . Esophageal varices without bleeding ([place])   . Portal hypertensive gastropathy ([place])   . Malnutrition of moderate degree ([place]) 05/15/2014  . Bipolar disorder ([place]) 12/04/2013  . SBP (spontaneous bacterial peritonitis) ([place]) 11/10/2013  . 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 Folate deficiency 10/13/2013  . Anasarca 10/10/2013  . Anemia 10/06/2013    Palliative Care Assessment & Plan   Patient Profile: 41 y.o.femalewith past medical history of Anxiety and depression, bipolar disorder, NASH, liver cirrhosis, history is C. difficile colitis, pneumonia, spontaneous bacterial peritonitis, ESRD on dialysis, bleeding esophageal varices banded in 2015, admitted on 4/13/2018with abdominal pain possibly secondary to ascites, end-stage renal disease on dialysis, but she has missed the last several weeks of dialysis.   Assessment: End-stage kidney disease; Ms. Dooly has missed approximately 3 weeks of hemodialysis. She is unable to give a clear answer as to why she chose to skip dialysis. As she improves, it may benefit her to discuss her options for discontinuing of hemodialysis, and what that would look like for her. She took dialysis Sunday and today. 4/19, Ms. Karp has been notified by nephrology that if she misses 2 dialysis sections she will no longer be followed by their service. NASH; Ms. Reicher has a complicated G.I. history. She is found have colitis at this time, and has a history of SBP, C diff colitis.   Recommendations/Plan:  continue to treat the treatable, healthcare power of attorney and code status discussions.  Goals of Care and Additional Recommendations:  Limitations on Scope of Treatment: Full Scope Treatment  Code Status:    Code Status Orders        Start     Ordered   11/20/16 2155  Full code  Continuous     04 /13/18 2155    Code Status History    Date Active Date  Inactive Code Status Order ID Comments User Context   10/30/2016  3:58 AM 10/30/2016 10:01 PM Full Code [id number]  Vianne Bulls, MD ED   10/07/2016  9:56 PM 10/10/2016  8:30 PM Full Code [id number]  Omar Person, NP Inpatient   09/23/2016 12:48 AM 09/27/2016 11:58 PM Full Code [id number]  Corey Harold, NP Inpatient   05/19/2016  7:40 PM 05/27/2016  6:47 PM Full Code [id number]  Corey Harold, NP Inpatient   04/29/2016  2:45 AM 04/30/2016  9:15 AM Full Code [id number]  Orvan Falconer, MD Inpatient   11/06/2015  4:35 AM 11/07/2015  5:21 PM Full Code [id number]  Phillips Grout, MD ED   08/20/2015  8:34 AM 08/22/2015  7:05 PM Full Code [id number]  Nita Sells, MD Inpatient   06/04/2015 10:06 PM 06/09/2015  9:37 PM Full Code [id number]  Corey Harold, NP Inpatient   10/25/2014 10:02 PM 11/01/2014  4:01 PM Full Code [id number]  Doree Albee, MD ED   09/16/2014  2:39 AM 09/19/2014  7:30 PM Full Code [id number]  Phillips Grout, MD Inpatient   08/27/2014  5:28 PM 08/29/2014 12:10 AM Full Code [id number]  Jolaine Artist  Roderic Palau, MD Inpatient   08/27/2014 11:53 AM 08/27/2014  5:28 PM Full Code [id number]  Azzie Roup, MD [place]   08/13/2014 11:03 PM 08/27/2014 11:53 AM Full Code [id number]  Jani Gravel, MD Inpatient   07/03/2014  6:09 PM 07/07/2014 11:07 PM Full Code [id number]  Waldemar Dickens, MD Inpatient   05/14/2014  6:30 PM 05/18/2014  5:03 PM Full Code [id number]  Waldemar Dickens, MD Inpatient   04/19/2014  9:56 PM 04/27/2014  6:33 PM Full Code [id number]  Caren Griffins, MD ED   02/25/2014 12:46 AM 03/02/2014  5:03 PM Full Code [id number]  Bonnielee Haff, MD Inpatient   11/23/2013 11:52 AM 11/24/2013  3:31 AM Full Code [id number]  Arne Cleveland, MD [facility]   11/05/2013  9:15 PM 11/17/2013  8:47 PM Full Code [id number]  Allyne Gee, MD Inpatient   10/06/2013  1:20 AM 10/13/2013  9:21 PM Full Code [id number]  Bonnielee Haff, MD ED   09/11/2013 11:21 PM 09/13/2013  5:23 PM Full Code [id number]  Bonnielee Haff, MD Inpatient       Prognosis:    Unable to determine, based on outcomes  Discharge Planning:  Home, denies need for services. Declines SNF  Care plan was discussed with nursing staff, case manager, social worker, and Dr. Adair Patter.   Thank you for allowing the Palliative Medicine Team to assist in the care of this patient.   Time In: 1140 Time Out: 1200 Total Time 20 minutes  Prolonged Time Billed  no       Greater than 50%  of this time was spent counseling and coordinating care related to the above assessment and plan.  Drue Novel, NP  Please contact Palliative Medicine Team phone at [phone] for questions and concerns.

## 2016-11-29 IMAGING — US US ABDOMEN LIMITED
1 series · 6 of 6 positions shown · non-contrast
Comparison: 04/21/2016

CLINICAL DATA: Cirrhosis, ascites

EXAM:
LIMITED ABDOMEN ULTRASOUND FOR ASCITES
TECHNIQUE: Limited ultrasound survey for ascites was performed in all four
abdominal quadrants.

[Series 1: us abdomen limited · 0.28mm/px · 6 of 6 slices shown]
[im 1/6]
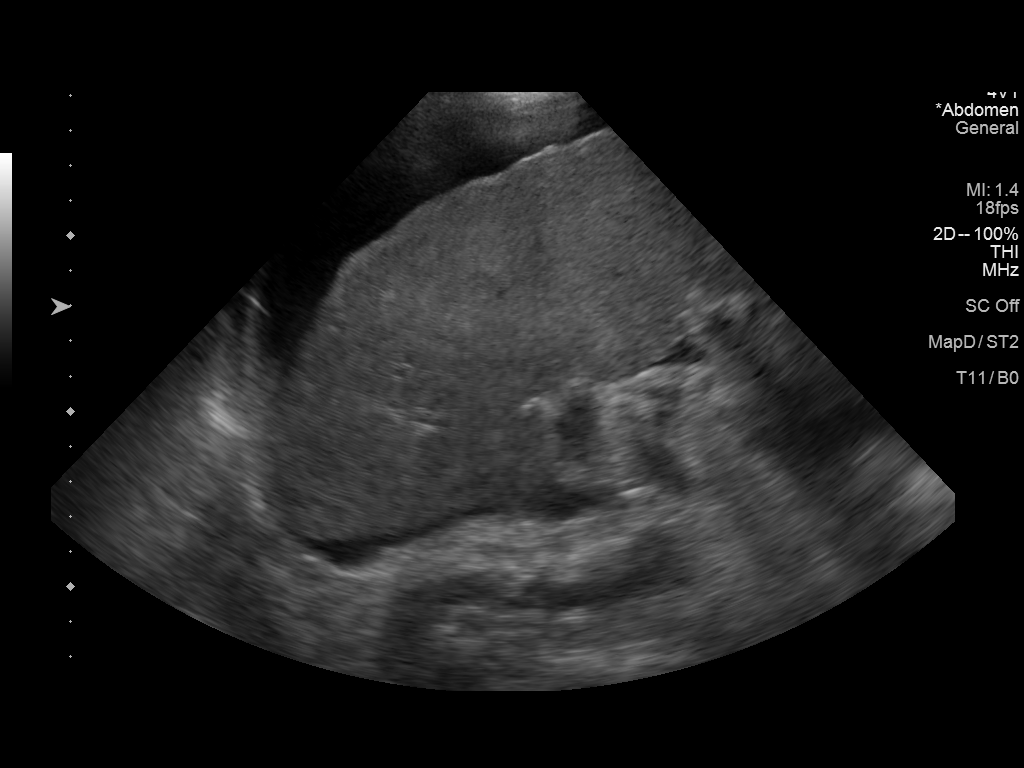
[im 2/6]
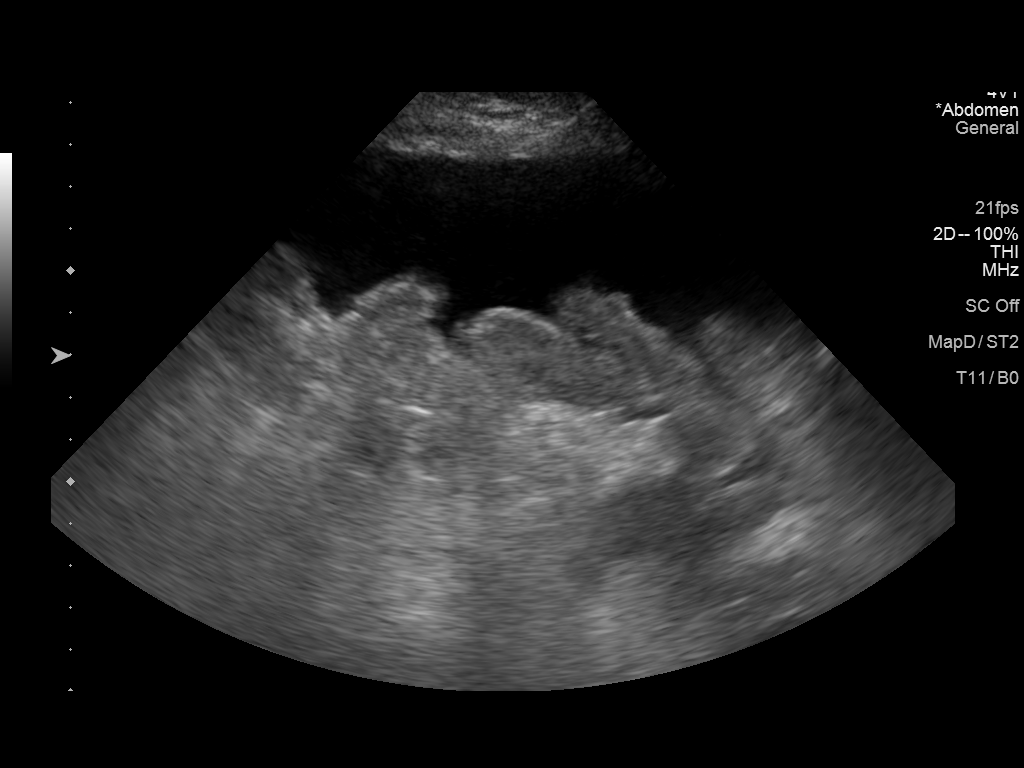
[im 3/6]
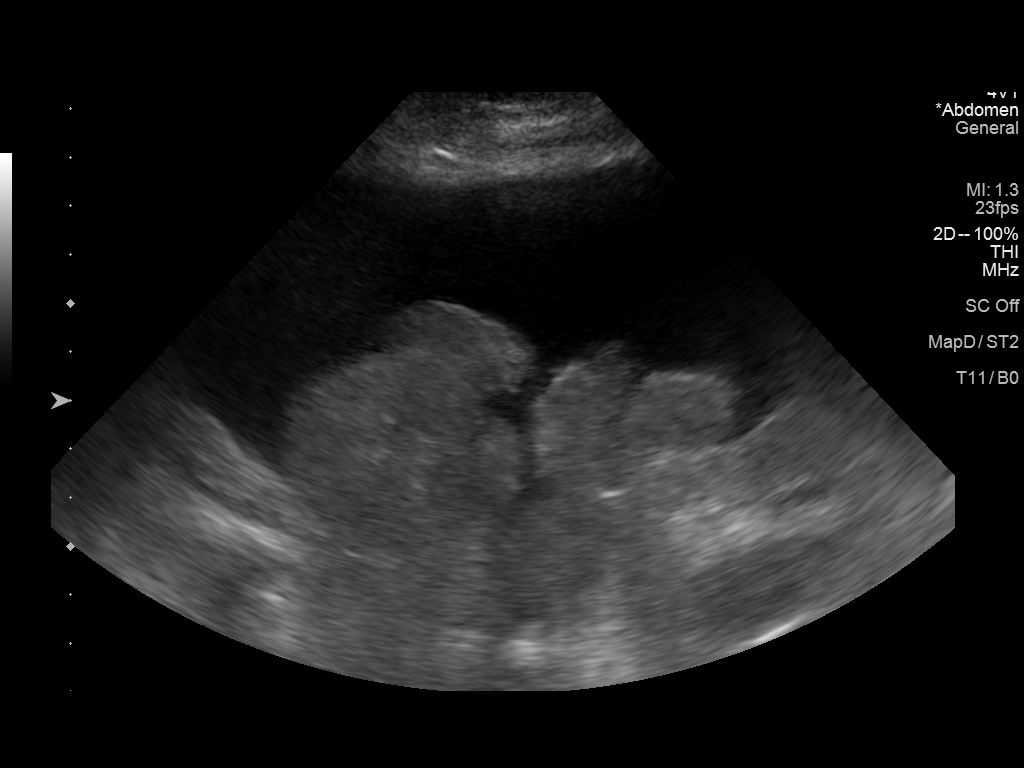
[im 4/6]
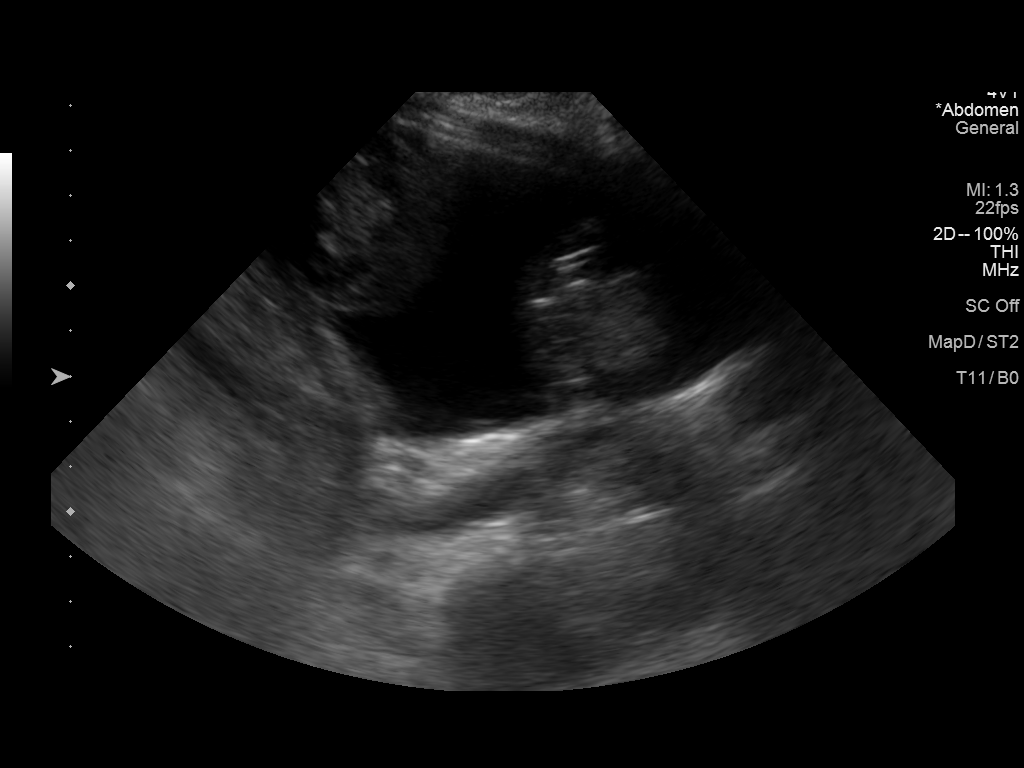
[im 5/6]
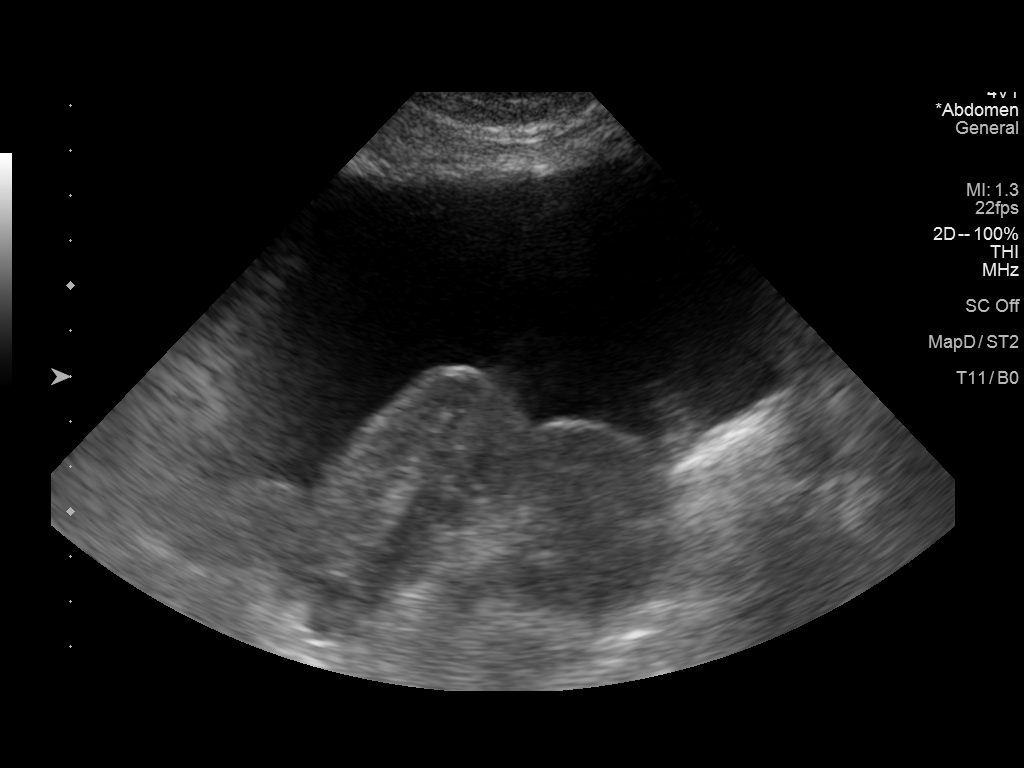
[im 6/6]
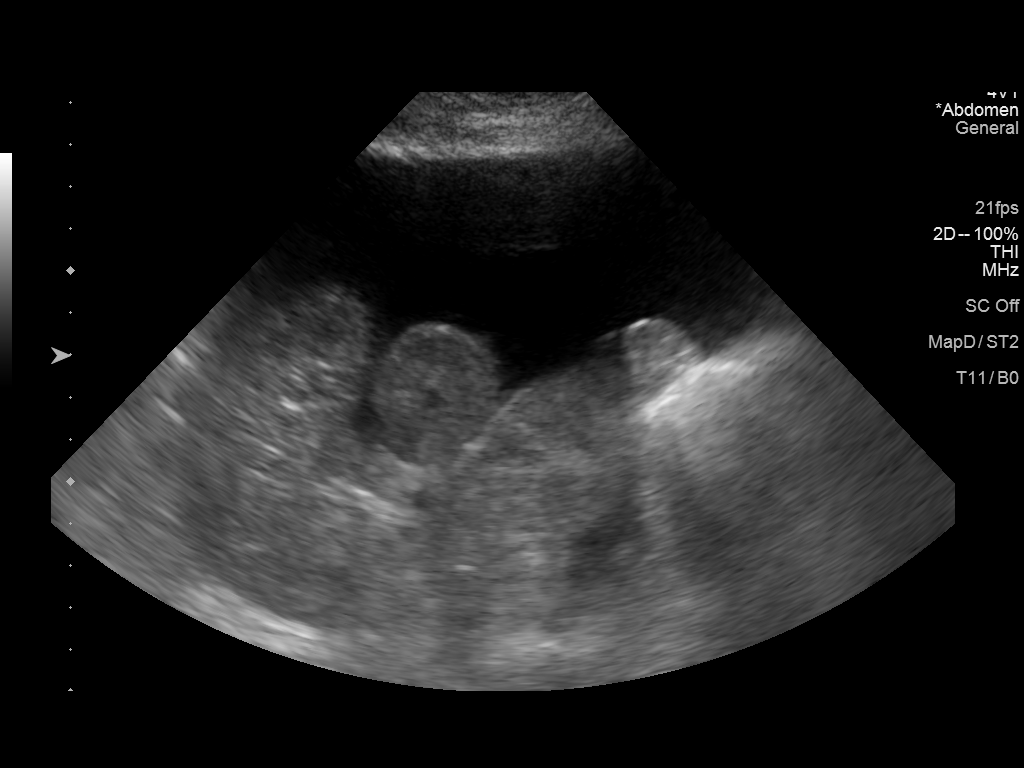

[6 of 6 positions shown; findings below may reference images not displayed]

FINDINGS: Scattered ascites is identified in all 4 quadrants greater in LEFT
lower quadrant.
IMPRESSION: Scattered ascites throughout abdomen.

## 2016-11-30 IMAGING — CR DG CHEST 1V PORT
1 series · 1 of 1 positions shown · non-contrast
Comparison: 10/08/2015

CLINICAL DATA: Rhonchi.  Former smoker.

EXAM:
PORTABLE CHEST 1 VIEW

[portable]
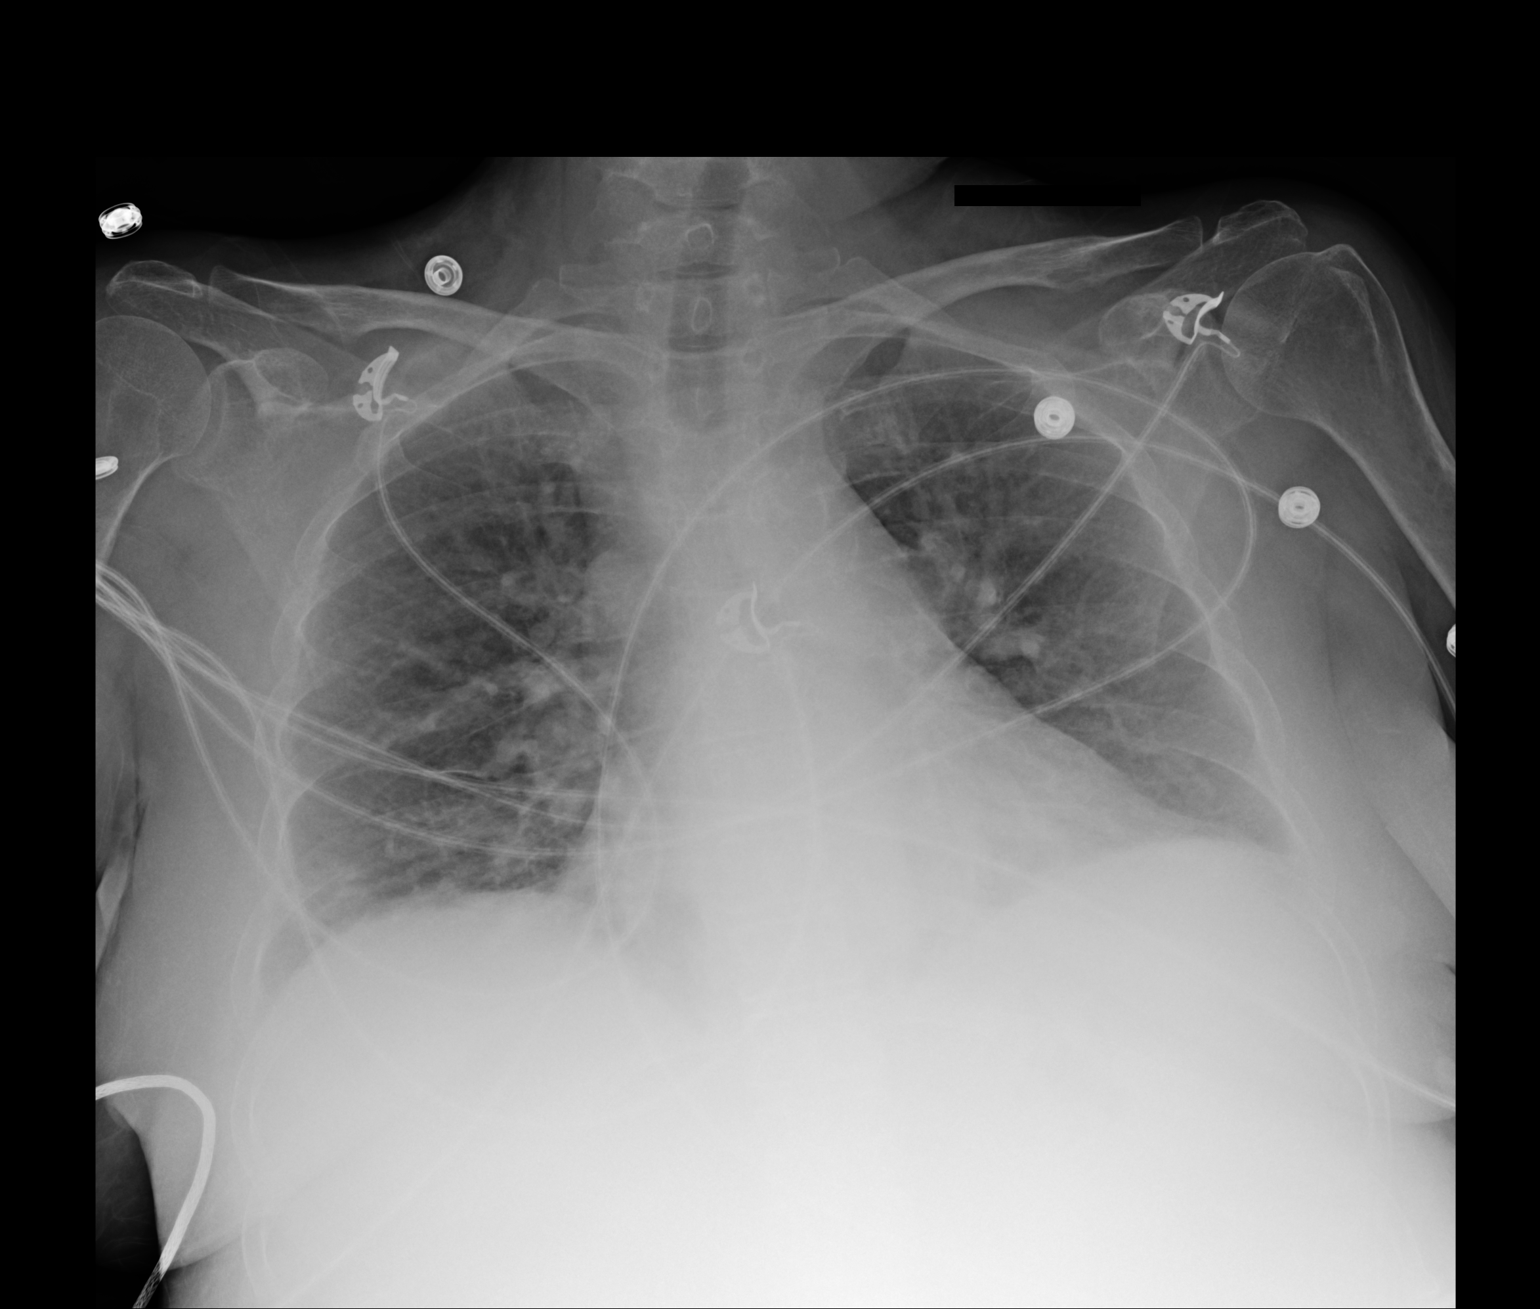

[1 of 1 positions shown; findings below may reference images not displayed]

FINDINGS: The cardiac silhouette is upper limits of normal in size,
accentuated by portable AP technique as well as shallow inspiration.
There is central pulmonary vascular congestion. Minimal bibasilar
opacities suggest atelectasis. No overt pulmonary edema, sizable
pleural effusion, or pneumothorax is identified. No acute osseous
abnormality is identified.
IMPRESSION: Shallow lung inflation with pulmonary vascular congestion and
minimal bibasilar atelectasis.

## 2016-12-01 ENCOUNTER — Other Ambulatory Visit: Payer: Self-pay | Admitting: Gastroenterology

## 2016-12-01 IMAGING — US US PARACENTESIS
1 series · 3 of 3 positions shown · non-contrast
Comparison: none

INDICATION: Cirrhosis, ascites

[Series 1: us paracentesis · 0.21mm/px · 3 of 3 slices shown]
[im 1/3]
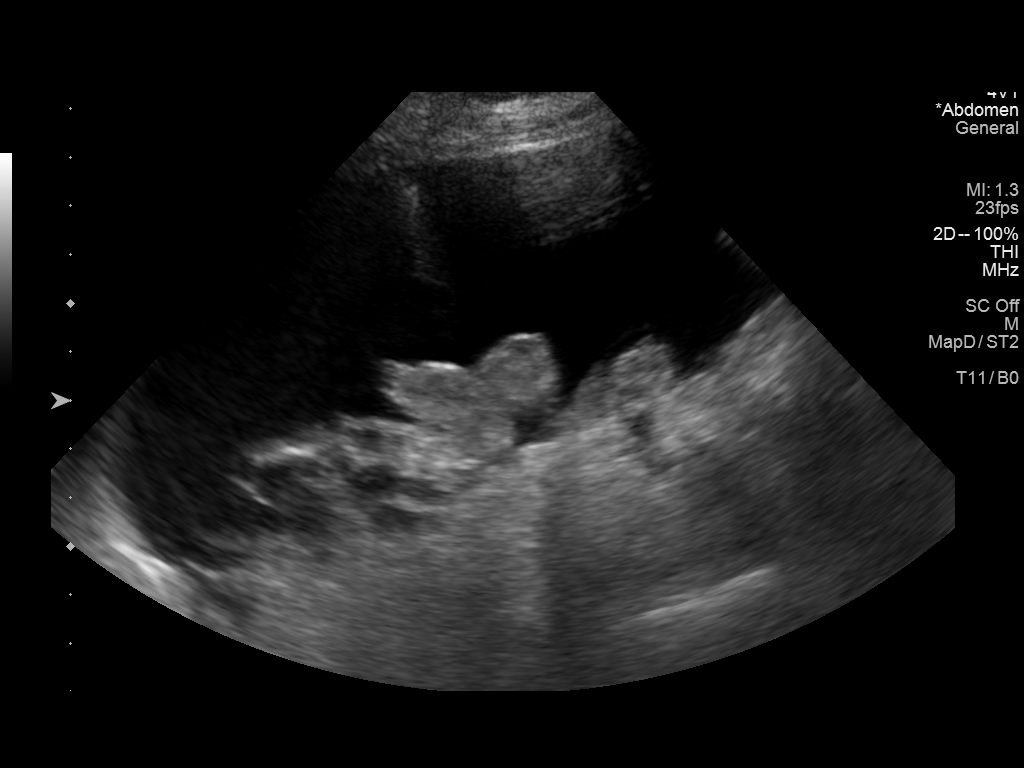
[im 2/3]
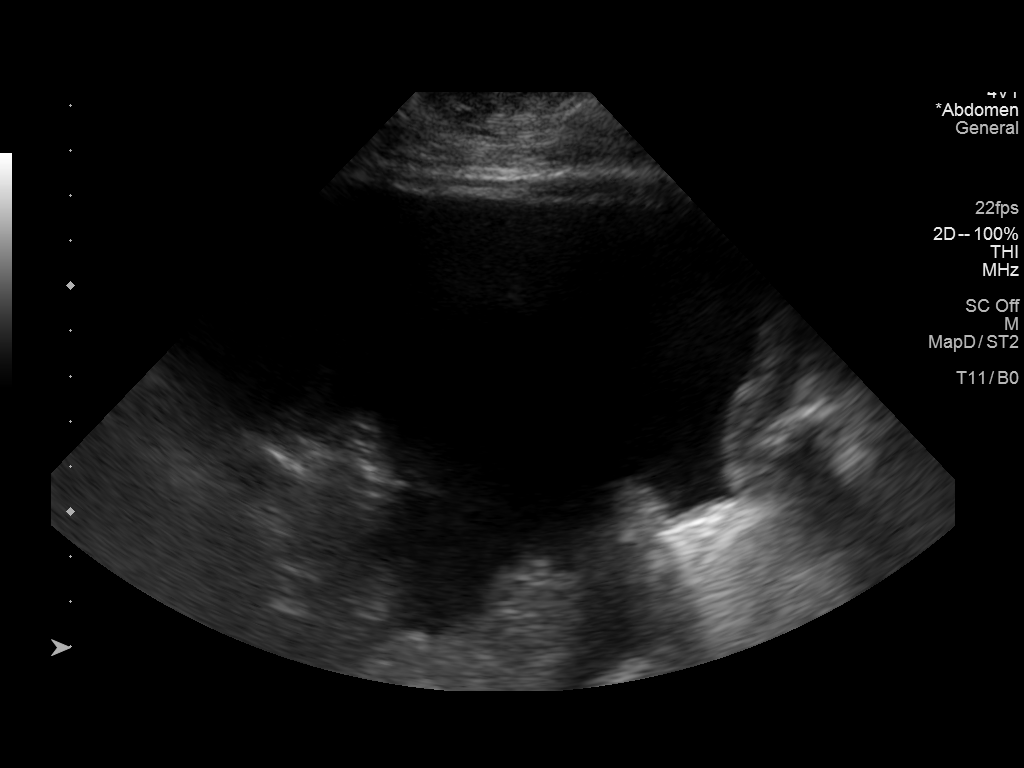
[im 3/3]
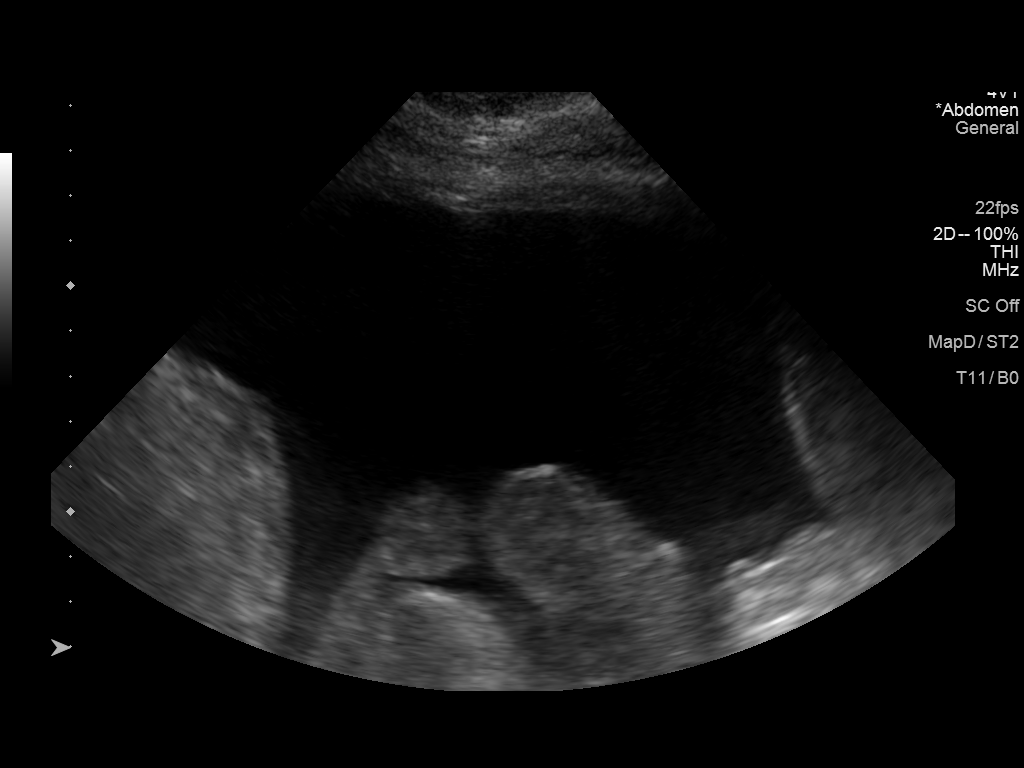

[3 of 3 positions shown; findings below may reference images not displayed]

EXAM:
ULTRASOUND GUIDED THERAPEUTIC PARACENTESIS

MEDICATIONS:
None.

COMPLICATIONS:
None immediate.

PROCEDURE:
Procedure, benefits, and risks of procedure were discussed with
patient.

Written informed consent for procedure was obtained.

Time out protocol followed.

Adequate collection of ascites localized by ultrasound in LEFT lower
quadrant.

Skin prepped and draped in usual sterile fashion.

Skin and soft tissues anesthetized with 10 mL of 1% lidocaine.

5 French Yueh catheter placed into peritoneal cavity.

5.4 L of yellow fluid aspirated by vacuum bottle suction.

Procedure tolerated well by patient without immediate complication.
FINDINGS: As above
IMPRESSION: Successful ultrasound-guided paracentesis yielding 5.4 liters of
peritoneal fluid.

## 2016-12-01 NOTE — Telephone Encounter (Signed)
Forwarding to Dr.Fields.  

## 2016-12-01 NOTE — Telephone Encounter (Signed)
Patient is on her last bottle of pain meds and was told to call so slf could get her a prescription ready

## 2016-12-03 NOTE — Telephone Encounter (Signed)
PLEASE CALL PT. RX WILL BE AVAILABLE MON APR 30.

## 2016-12-04 NOTE — Telephone Encounter (Signed)
Tried to call, Vm not set up. = 

## 2016-12-06 IMAGING — US US PARACENTESIS
1 series · 3 of 3 positions shown · non-contrast
Comparison: none

INDICATION: Ascites for paracentesis

[Series 1: us paracentesis · 0.32mm/px · 3 of 3 slices shown]
[im 1/3]
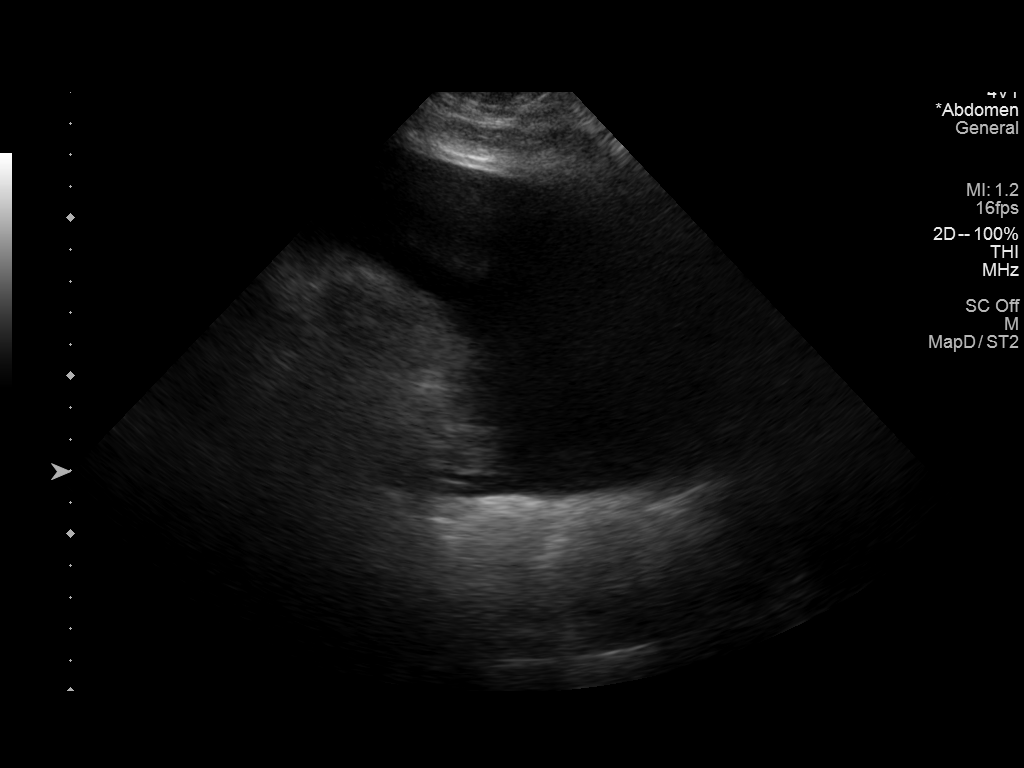
[im 2/3]
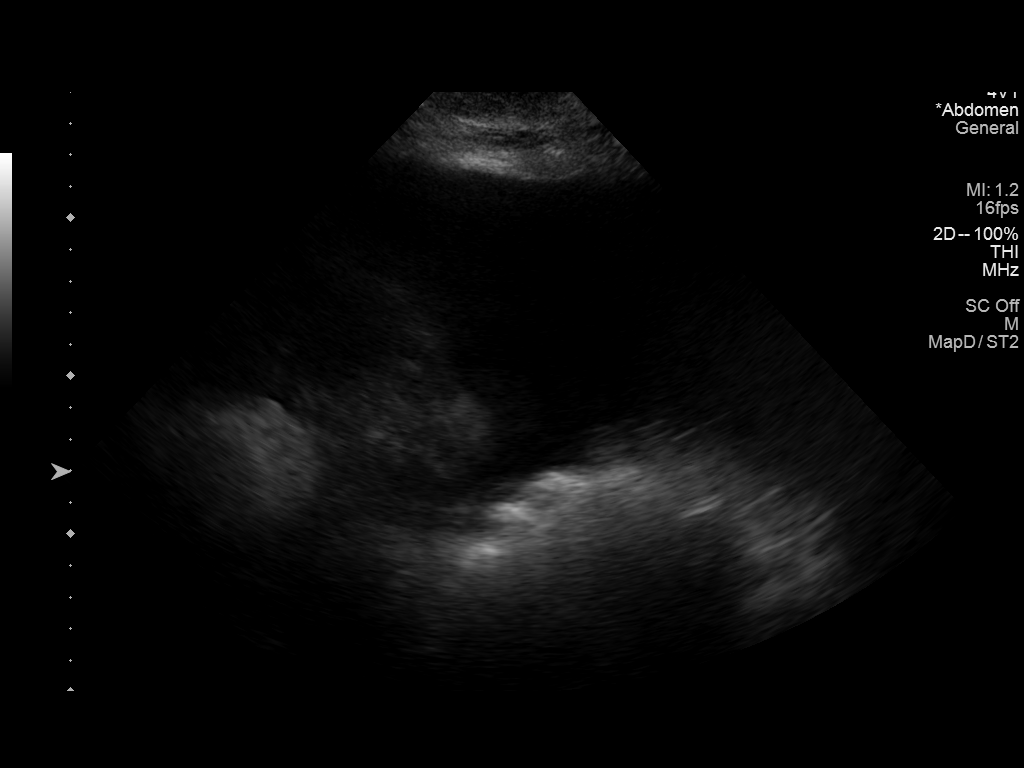
[im 3/3]
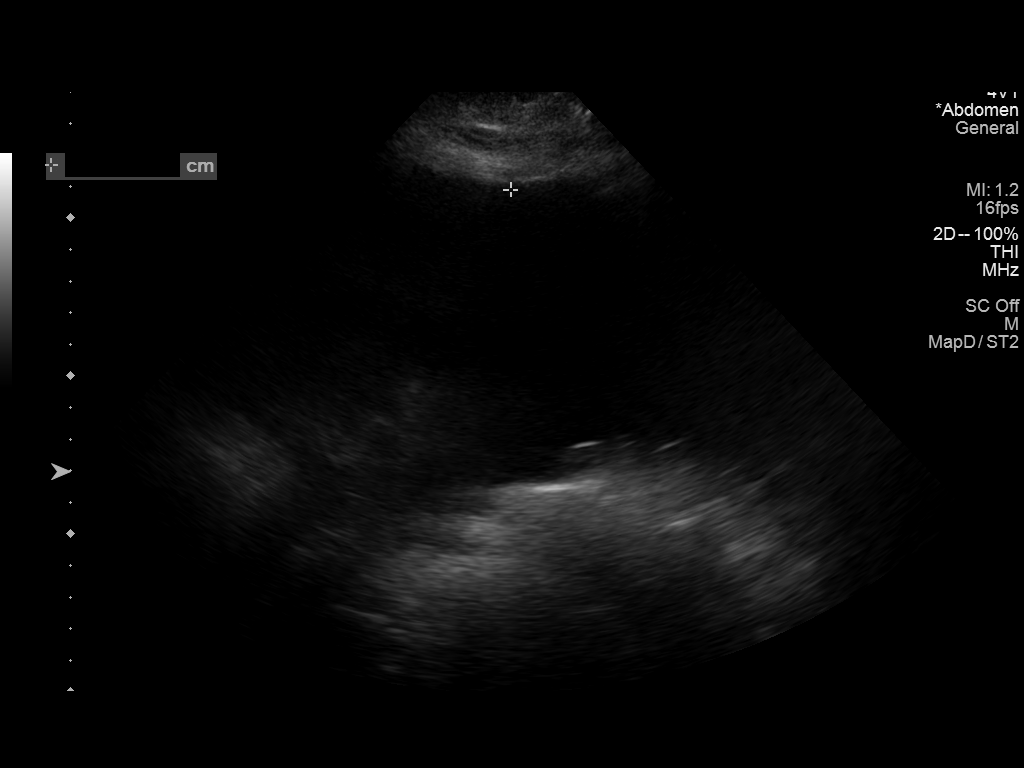

[3 of 3 positions shown; findings below may reference images not displayed]

EXAM:
ULTRASOUND GUIDED RIGHT PARACENTESIS

MEDICATIONS:
None.

COMPLICATIONS:
None immediate.

PROCEDURE:
Informed written consent was obtained from the patient after a
discussion of the risks, benefits and alternatives to treatment. A
timeout was performed prior to the initiation of the procedure.

Initial ultrasound scanning demonstrates a large amount of ascites
within the right lower abdominal quadrant. The right lower abdomen
was prepped and draped in the usual sterile fashion. 1% lidocaine
with epinephrine was used for local anesthesia.

Following this, a 19 gauge, 7-cm, Yueh catheter was introduced. An
ultrasound image was saved for documentation purposes. The
paracentesis was performed. The catheter was removed and a dressing
was applied. The patient tolerated the procedure well without
immediate post procedural complication.
FINDINGS: A total of approximately 4.1 L of yellow fluid was removed.
IMPRESSION: Successful ultrasound-guided paracentesis yielding 4.1 liters of
peritoneal fluid.

## 2016-12-07 ENCOUNTER — Telehealth: Payer: Self-pay

## 2016-12-07 NOTE — Telephone Encounter (Signed)
I tried to call both phone numbers to tell pt the prescriptions for Oxycodone are at front and ready to be picked up. Could not reach her at either and VM not set up. I am mailing a letter that they are ready to pick up.  Could not find original note Dr. Oneida Alar sent telling me they would be ready on Monday.

## 2016-12-07 NOTE — Telephone Encounter (Signed)
Tried to call. Mailed letter the prescriptions are ready for pick up.

## 2016-12-08 ENCOUNTER — Emergency Department (HOSPITAL_COMMUNITY): Payer: Medicare Other

## 2016-12-08 ENCOUNTER — Emergency Department (HOSPITAL_COMMUNITY)
Admission: EM | Admit: 2016-12-08 | Discharge: 2016-12-08 | Disposition: A | Discharge status: Home / Self Care | Payer: Medicare Other | Attending: Emergency Medicine | Admitting: Emergency Medicine

## 2016-12-08 ENCOUNTER — Encounter (HOSPITAL_COMMUNITY): Payer: Self-pay | Admitting: Emergency Medicine

## 2016-12-08 DIAGNOSIS — R0602 Shortness of breath: Secondary | ICD-10-CM | POA: Diagnosis not present

## 2016-12-08 DIAGNOSIS — M79604 Pain in right leg: Secondary | ICD-10-CM

## 2016-12-08 DIAGNOSIS — Z992 Dependence on renal dialysis: Secondary | ICD-10-CM | POA: Insufficient documentation

## 2016-12-08 DIAGNOSIS — Z87891 Personal history of nicotine dependence: Secondary | ICD-10-CM | POA: Diagnosis not present

## 2016-12-08 DIAGNOSIS — N3000 Acute cystitis without hematuria: Secondary | ICD-10-CM

## 2016-12-08 DIAGNOSIS — N186 End stage renal disease: Secondary | ICD-10-CM | POA: Diagnosis not present

## 2016-12-08 DIAGNOSIS — Z79899 Other long term (current) drug therapy: Secondary | ICD-10-CM | POA: Diagnosis not present

## 2016-12-08 LAB — CBC WITH DIFFERENTIAL/PLATELET
BASOS ABS: 0.1 10*3/uL (ref 0.0–0.1)
Basophils Relative: 2 %
Eosinophils Absolute: 0.1 10*3/uL (ref 0.0–0.7)
Eosinophils Relative: 2 %
HEMATOCRIT: 23.6 % — AB (ref 36.0–46.0)
HEMOGLOBIN: 7.6 g/dL — AB (ref 12.0–15.0)
LYMPHS PCT: 15 %
Lymphs Abs: 0.9 10*3/uL (ref 0.7–4.0)
MCH: 31.5 pg (ref 26.0–34.0)
MCHC: 32.2 g/dL (ref 30.0–36.0)
MCV: 97.9 fL (ref 78.0–100.0)
MONO ABS: 0.7 10*3/uL (ref 0.1–1.0)
Monocytes Relative: 12 %
NEUTROS ABS: 4.1 10*3/uL (ref 1.7–7.7)
Neutrophils Relative %: 69 %
Platelets: 173 10*3/uL (ref 150–400)
RBC: 2.41 MIL/uL — AB (ref 3.87–5.11)
RDW: 18.2 % — ABNORMAL HIGH (ref 11.5–15.5)
WBC: 5.9 10*3/uL (ref 4.0–10.5)

## 2016-12-08 LAB — URINALYSIS, ROUTINE W REFLEX MICROSCOPIC
Bilirubin Urine: NEGATIVE
GLUCOSE, UA: NEGATIVE mg/dL
KETONES UR: NEGATIVE mg/dL
Nitrite: NEGATIVE
PH: 5 (ref 5.0–8.0)
PROTEIN: 100 mg/dL — AB
Specific Gravity, Urine: 1.016 (ref 1.005–1.030)

## 2016-12-08 LAB — BASIC METABOLIC PANEL
ANION GAP: 14 (ref 5–15)
BUN: 49 mg/dL — ABNORMAL HIGH (ref 6–20)
CHLORIDE: 96 mmol/L — AB (ref 101–111)
CO2: 24 mmol/L (ref 22–32)
Calcium: 8.4 mg/dL — ABNORMAL LOW (ref 8.9–10.3)
Creatinine, Ser: 8.46 mg/dL — ABNORMAL HIGH (ref 0.44–1.00)
GFR calc Af Amer: 6 mL/min — ABNORMAL LOW (ref >60)
GFR calc non Af Amer: 5 mL/min — ABNORMAL LOW (ref >60)
GLUCOSE: 107 mg/dL — AB (ref 65–99)
POTASSIUM: 3.6 mmol/L (ref 3.5–5.1)
Sodium: 134 mmol/L — ABNORMAL LOW (ref 135–145)

## 2016-12-08 MED ORDER — CEPHALEXIN 500 MG PO CAPS
500.0000 mg | ORAL_CAPSULE | Freq: Once | ORAL | Status: AC
  Administered 2016-12-08: 500 mg via ORAL
  Filled 2016-12-08: qty 1

## 2016-12-08 MED ORDER — CEPHALEXIN 500 MG PO CAPS: ORAL_CAPSULE | ORAL | 0 refills | Status: DC

## 2016-12-08 NOTE — ED Notes (Signed)
Pt taken to xray 

## 2016-12-08 NOTE — ED Triage Notes (Signed)
Pt reports right groin pain with no injury.  Last dialysis on Friday, was supposed to go today but didn't feel like it.  Cannot ambulate due to generalized weakness.

## 2016-12-08 NOTE — Discharge Instructions (Signed)
Watch for increasing pain or fevers. Watch for feeling worse. Take your pill after dialysis.

## 2016-12-08 NOTE — ED Provider Notes (Signed)
[facility] DEPT Provider Note   CSN: [id number] Arrival date & time: 12/08/16  1347     History   Chief Complaint Chief Complaint  Patient presents with  . Groin Pain    HPI Wanda [name] is a 41 y.o. female.  HPI Patient presents with right groin.. States it began yesterday. Has been using some lotion on it. States it hurts to walk. States it feels like a pulled muscle but says she did not do anything that would hold it. No fall. No fevers or chills. No dysuria. Patient is a dialysis patient. She is also be dialyzed today. Last dialyzed Friday. Recent admissions to the hospital. Has refused nursing home placement and home health. History of dialysis and cirrhosis.   Past Medical History:  Diagnosis Date  . Acute blood loss anemia 02/25/2014   Status post transfusion  . Acute renal failure ([place]) 09/2013   Pre-renal- resolved  . Anasarca 10/10/2013  . Anxiety   . Bipolar disorder ([place]) 12/04/2013   2007-SEEN IN ED FOR INVOLUNTARY COMMITMENT, UDS POS FOR AMPHETAMINES/OPIATES   . Bleeding esophageal varices ([place]) 02/28/2014   s/p banding  . C. difficile colitis 04/19/2014  . Chronic hypotension   . Cirrhosis ([place]) 10/05/13   Liver bx 11/23/13 (delayed initially due to patient refusal). c/w steatohepatitis  . Cirrhosis of liver with ascites ([place])   . Depression   . ESRD (end stage renal disease) on dialysis ([place]) 08/2014  . Folate deficiency 09/2013  . Gastroesophageal junction ulcer 09/17/2014  . GERD (gastroesophageal reflux disease)   . Hematemesis/vomiting blood 02/24/2014  . Macrocytosis 02/28/2014  . PNA (pneumonia) 10/13/2013  . SBP (spontaneous bacterial peritonitis) ([place]) 11/10/2013  . Thrombocytopenia ([place])    Hypercellular bone marrow; abundant megakaryocytes per 08/27/2014; s/p bone marrow bx    Patient Active Problem List   Diagnosis Date Noted  . History of noncompliance with medical treatment   . Liver cirrhosis secondary to NASH (nonalcoholic steatohepatitis) ([place])    . Palliative care encounter   . Pulmonary edema 11/20/2016  . Hypocalcemia 11/20/2016  . Cardiomyopathy- ? ischemic  10/09/2016  . Troponin level elevated 10/09/2016  . Goals of care, counseling/discussion   . Palliative care by specialist   . Seizure ([place]) 10/07/2016  . Pressure injury of skin 05/20/2016  . Shock ([place]) 05/19/2016  . Rhonchi   . Chronic pain syndrome 08/22/2015  . Abdominal pain 08/07/2015  . Umbilical hernia without obstruction and without gangrene 08/07/2015  . GI bleed   . Acute blood loss anemia   . Hypotension 06/05/2015  . Edema of left lower extremity 02/22/2015  . Ascites of liver   . History of esophageal varices with bleeding 09/16/2014  . End stage renal disease on dialysis ([place])   . Anemia of chronic disease   . Thrombocytopenia ([place]) 08/19/2014  . Hyponatremia 08/17/2014  . Esophageal varices without bleeding ([place])   . Portal hypertensive gastropathy ([place])   . Malnutrition of moderate degree ([place]) 05/15/2014  . Bipolar disorder ([place]) 12/04/2013  . SBP (spontaneous bacterial peritonitis) ([place]) 11/10/2013  . Liver cirrhosis secondary to nonalcoholic steatohepatitis (NASH) ([place]) 11/07/2013  . Folate deficiency 10/13/2013  . Anasarca 10/10/2013  . Anemia 10/06/2013    Past Surgical History:  Procedure Laterality Date  . AV FISTULA PLACEMENT Right 11/16/2014   Procedure: Right arm Creation of arteriovenous fistula;  Surgeon: Angelia Mould, MD;  Location: [facility];  Service: Vascular;  Laterality: Right;  . CENTRAL VENOUS CATHETER INSERTION Right   .  COLONOSCOPY N/A 12/19/2013   SLF:NO OBVIOUS SOURCE FOR ANEMIA IDETIFIED/ONE COLON POLYP REMOVED/Small internal hemorrhoids  . ESOPHAGEAL BANDING  07/04/2014   Procedure: ESOPHAGEAL BANDING;  Surgeon: Daneil Dolin, MD;  [facility];  Service: Endoscopy;;  . ESOPHAGEAL BANDING N/A 07/24/2014   Procedure: ESOPHAGEAL BANDING (2 bands applied);  Surgeon: Danie Binder, MD;  Location: [facility];  Service: Endoscopy;  Laterality: N/A;  . ESOPHAGEAL BANDING N/A 07/23/2015   Procedure: ESOPHAGEAL BANDING;  Surgeon: Danie Binder, MD;  [facility];  Service: Endoscopy;  Laterality: N/A;  . ESOPHAGEAL BANDING N/A 03/04/2016   Procedure: ESOPHAGEAL BANDING;  Surgeon: Danie Binder, MD;  [facility];  Service: Endoscopy;  Laterality: N/A;  . ESOPHAGEAL BANDING N/A 04/30/2016   Procedure: ESOPHAGEAL BANDING;  Surgeon: Daneil Dolin, MD;  [facility];  Service: Endoscopy;  Laterality: N/A;  . ESOPHAGOGASTRODUODENOSCOPY N/A 11/14/2013   SLF:1 column of very small varices in distal esopahgus/MODERATE PORTAL GASTROPATHY IN PROXIMAL STOMACH/MODERATE erosive gastritis  . ESOPHAGOGASTRODUODENOSCOPY N/A 02/11/2014   Dr. Rourk:Esophageal varices with bleeding stigmata-status post esophageal band ligation therapy. Portal gastropathy  . ESOPHAGOGASTRODUODENOSCOPY N/A 07/04/2014   RMR: Persiting grade 2 esophageal varicies with bleeding stigmata status post band ligation. Significantly congested gastric mucosa iwith changes constistant with protal gastropathy.   . ESOPHAGOGASTRODUODENOSCOPY N/A 09/16/2014   Rehman: Single short column of varix proximal to GE junction not large enough to be banded. Two amall ulcers at the GEJ felt to be source of GI Bleeding but no active bleeding but no actibe bleeding noted. No therapy rendered. Portal gastropathy NO evidence of peptic ulcer diease or gastric varices.   . ESOPHAGOGASTRODUODENOSCOPY N/A 12/14/2014   SLF: Grade ! esophageal varices. 2. Moderate Portal Gastropathy  . ESOPHAGOGASTRODUODENOSCOPY N/A 03/27/2015   Procedure: ESOPHAGOGASTRODUODENOSCOPY (EGD);  Surgeon: Danie Binder, MD;  [facility];  Service: Endoscopy;  Laterality: N/A;  1045am - moved to 817 @ 11:30  . ESOPHAGOGASTRODUODENOSCOPY N/A 06/05/2015   Procedure: ESOPHAGOGASTRODUODENOSCOPY (EGD);  Surgeon: Clarene Essex, MD;  Location: [facility] ENDOSCOPY;   Service: Endoscopy;  Laterality: N/A;  . ESOPHAGOGASTRODUODENOSCOPY N/A 07/23/2015   SLF:1. Grade 1 esophageal varices 2. Moderate portal hypertensive gastropathy 3. MILd non-erosive gastritis.   [name] ESOPHAGOGASTRODUODENOSCOPY N/A 03/04/2016   Dr. Oneida Alar: grade 1 varices, surveillance in Jan 2017   . ESOPHAGOGASTRODUODENOSCOPY N/A 05/21/2016   Dr. Carlean Purl: non-bleeding esophageal ulcer, likely secondary to MW tear  . ESOPHAGOGASTRODUODENOSCOPY (EGD) WITH PROPOFOL N/A 07/24/2014   SLF:  1. 2 columns grade 2-3 varices- 2 Bands applied.  2.  Moderate gastropathy 3. Duodenal Diverticula  . ESOPHAGOGASTRODUODENOSCOPY (EGD) WITH PROPOFOL N/A 10/26/2014   RMR: 2 columns of grade 2 esophageal varices without obvious bleeding stigmata status post band ligation to complet obliteration of remaining varices.   . ESOPHAGOGASTRODUODENOSCOPY (EGD) WITH PROPOFOL N/A 04/30/2016   Dr. Gala Romney: MW tear, varices obliterated, portal gastropathy  . None    . PARACENTESIS  Feb 2015   1180 fluid, negative fluid analysis.   [name] PARACENTESIS  10/2013    OB History    Gravida Para Term Preterm AB Living   1 1 1     1    SAB TAB Ectopic Multiple Live Births                   Home Medications    Prior to Admission medications   Medication Sig Start Date End Date Taking? Authorizing Provider  calcium acetate (  PHOSLO) 667 MG capsule Take 4 capsules (2,668 mg total) by mouth 3 (three) times daily with meals. 11/26/16 12/11/16 Yes Eber Jones, MD  calcium acetate (PHOSLO) 667 MG capsule Take 2 capsules (1,334 mg total) by mouth with snacks. 11/26/16 12/11/16 Yes Eber Jones, MD  clotrimazole (LOTRIMIN) 1 % cream Apply 1 application topically 3 (three) times daily. 09/30/16  Yes Danie Binder, MD  Darbepoetin Alfa (ARANESP) 60 MCG/0.3ML SOSY injection Inject 0.3 mLs (60 mcg total) into the vein every Thursday with hemodialysis. Patient taking differently: Inject 60 mcg into the vein every Tuesday with  hemodialysis.  06/09/15  Yes Cherene Altes, MD  lactulose (CHRONULAC) 10 GM/15ML solution Take 45 mLs (30 g total) by mouth 3 (three) times daily. TAKE 15-30 ml BY MOUTH three times a day Patient taking differently: Take 10-20 g by mouth 3 (three) times daily. TAKE 15-30 ml BY MOUTH three times a day 09/27/16  Yes Cherene Altes, MD  lidocaine (XYLOCAINE) 2 % solution TAKE TWO TEASPOONSFUL (10ML) BY MOUTH BEFORE MEALS AND AT BEDTIME TO PREVENT CHEST PAIN WHILE EATING 03/19/16  Yes Carlis Stable, NP  lidocaine-prilocaine (EMLA) cream Apply 1 application topically as needed (for dialysis treatments).   Yes Historical Provider, MD  LORazepam (ATIVAN) 1 MG tablet Take 1 mg by mouth 2 (two) times daily as needed for anxiety (and/or back spasms).   Yes Historical Provider, MD  midodrine (PROAMATINE) 10 MG tablet Take 1 tablet (10 mg total) by mouth every morning. Patient taking differently: Take 10 mg by mouth See admin instructions. Take ONLY on dialysis days 09/28/16  Yes Cherene Altes, MD  omeprazole (PRILOSEC) 20 MG capsule 1 po every morning 30 minutes prior to your first meal. Patient taking differently: Take 20 mg by mouth daily before breakfast. 1 po every morning 30 minutes prior to your first meal. 09/30/16  Yes Danie Binder, MD  Oxycodone HCl 10 MG TABS Take 0.5 tablets (5 mg total) by mouth 3 (three) times daily as needed. 1 PO TID AS NEEDED FOR PAIN Patient taking differently: Take 5-10 mg by mouth 3 (three) times daily as needed.  09/27/16  Yes Cherene Altes, MD  RENVELA 800 MG tablet Take 1600 mg by mouth 3 times daily with meals. Take 800 mg by mouth with snacks. 10/29/15  Yes Historical Provider, MD  sucralfate (CARAFATE) 1 GM/10ML suspension Take 1 g by mouth 4 (four) times daily -  with meals and at bedtime.   Yes Historical Provider, MD  cephALEXin (KEFLEX) 500 MG capsule 1 capsule after dialysis 12/08/16   Davonna Belling, MD    Family History Family History  Problem  Relation Age of Onset  . Heart disease Mother   . Colon cancer Neg Hx   . Liver disease Neg Hx     Social History Social History  Substance Use Topics  . Smoking status: Former Smoker    Packs/day: 0.25    Years: 20.00    Types: Cigarettes    Quit date: 11/05/2008  . Smokeless tobacco: Never Used     Comment: Never really smoked much  . Alcohol use No     Allergies   Iohexol; Lasix [furosemide]; and Latex   Review of Systems Review of Systems  Constitutional: Negative for appetite change.  HENT: Negative for congestion.   Respiratory: Positive for shortness of breath.   Cardiovascular: Negative for chest pain.  Gastrointestinal: Negative for abdominal pain.  Genitourinary: Negative for flank  pain.  Musculoskeletal: Positive for back pain.  Skin: Negative for wound.  Neurological: Negative for weakness.  Hematological: Negative for adenopathy.  Psychiatric/Behavioral: Negative for confusion.     Physical Exam Updated Vital Signs BP (!) 104/47   Pulse 79   Temp 98.7 F (37.1 C) (Oral)   Resp 15   Ht 5\' 1"  (1.549 m)   Wt 173 lb (78.5 kg)   LMP 09/21/2013   SpO2 98%   BMI 32.69 kg/m   Physical Exam  Constitutional: She appears well-developed.  HENT:  Head: Normocephalic.  Eyes: Pupils are equal, round, and reactive to light.  Neck: Neck supple.  Cardiovascular: Normal rate.   Pulmonary/Chest: She has no rales.  Abdominal: She exhibits distension.  Anasarca with chronic skin changes lower extremities and abdomen.  Musculoskeletal: She exhibits no edema.  Good rotation the bilateral lower extremities. Some pain with straight leg raise on right.  Neurological: She is alert.  Skin: Skin is warm.  Psychiatric:  Patient is wearing sunglasses     ED Treatments / Results  Labs (all labs ordered are listed, but only abnormal results are displayed) Labs Reviewed  CBC WITH DIFFERENTIAL/PLATELET - Abnormal; Notable for the following:       Result Value    RBC 2.41 (*)    Hemoglobin 7.6 (*)    HCT 23.6 (*)    RDW 18.2 (*)    All other components within normal limits  BASIC METABOLIC PANEL - Abnormal; Notable for the following:    Sodium 134 (*)    Chloride 96 (*)    Glucose, Bld 107 (*)    BUN 49 (*)    Creatinine, Ser 8.46 (*)    Calcium 8.4 (*)    GFR calc non Af Amer 5 (*)    GFR calc Af Amer 6 (*)    All other components within normal limits  URINALYSIS, ROUTINE W REFLEX MICROSCOPIC - Abnormal; Notable for the following:    Color, Urine AMBER (*)    APPearance CLOUDY (*)    Hgb urine dipstick SMALL (*)    Protein, ur 100 (*)    Leukocytes, UA MODERATE (*)    Bacteria, UA MANY (*)    Squamous Epithelial / LPF 6-30 (*)    Non Squamous Epithelial 0-5 (*)    All other components within normal limits  URINE CULTURE    EKG  EKG Interpretation None       Radiology Dg Chest 2 View  Result Date: 12/08/2016 CLINICAL DATA:  Weakness, no chest complaints. Acute renal failure. History of cirrhosis, former smoker. EXAM: CHEST  2 VIEW COMPARISON:  Portable chest x-ray of November 22, 2016 FINDINGS: The lungs are adequately inflated. The pulmonary vascularity is mildly prominent. The interstitial markings are less conspicuous today. The cardiac silhouette is mildly enlarged. The retrocardiac density has decreased. There is no significant pleural effusion. There is calcification in the wall of the aortic arch. The mediastinum is normal in width. IMPRESSION: Chronic bronchitic changes, stable. Improving left lower lobe atelectasis or infiltrate. Decreased pulmonary interstitial edema and pulmonary vascular congestion consistent with improving CHF. Thoracic aortic atherosclerosis. Electronically Signed   By: David  Martinique M.D.   On: 12/08/2016 14:44    Procedures Procedures (including critical care time)  Medications Ordered in ED Medications  cephALEXin (KEFLEX) capsule 500 mg (500 mg Oral Given 12/08/16 1802)     Initial Impression /  Assessment and Plan / ED Course  I have reviewed the triage vital  signs and the nursing notes.  Pertinent labs & imaging results that were available during my care of the patient were reviewed by me and considered in my medical decision making (see chart for details).     Patient with groin pain. Pain she is complaining of may be more musculoskeletal and goes down the leg. She does however have suprapubic tenderness. Urinalysis shows possible infection. Discussed with pharmacy and will start on Keflex that she'll be given now and after dialysis episodes. Patient really did not want to be in to the hospital with all her comorbidities she is at risk for having to come back. Culture sent.hopefully will be seen at dialysis tomorrow.  Final Clinical Impressions(s) / ED Diagnoses   Final diagnoses:  Acute cystitis without hematuria  Pain of right lower extremity    New Prescriptions Discharge Medication List as of 12/08/2016  5:44 PM    START taking these medications   Details  cephALEXin (KEFLEX) 500 MG capsule 1 capsule after dialysis, Print         Davonna Belling, MD 12/08/16 2109

## 2016-12-09 ENCOUNTER — Telehealth: Payer: Self-pay

## 2016-12-09 NOTE — Telephone Encounter (Signed)
Wanda [name] came by the office to see what they could do. Patient went to the ER 12/08/16. He stated that she can not walk and her legs are red and hot. He said that the fluid is bad but there was nothing to be drawn off. She is eating good.Wanda [name] is just worried about her since she is not able to walk now. Please advise

## 2016-12-09 NOTE — Telephone Encounter (Signed)
She did not make dialysis yesterday but she did go to the ER and they did not say anything about her legs(per [name])

## 2016-12-09 NOTE — Telephone Encounter (Signed)
Has she been going to dialysis? Vital that she is compliant with this. If she can't walk, she needs ED evaluation.

## 2016-12-09 NOTE — Telephone Encounter (Signed)
I can't offer advice without seeing patient. She will need an office visit here this week. It is still not clear if she has been compliant with dialysis. She was not going to dialysis as scheduled when last seen in the hospital, and I am concerned about that. It is unclear if antibiotics. This would depend on what antibiotics she is taking. If she has any mental status changes or fever, needs to go to ED. Any worsening of lower extremity issues, needs urgent evaluation. Otherwise, appt here this week.

## 2016-12-09 NOTE — Telephone Encounter (Signed)
I thought she was to have dialysis today? This is concerning. She needs dialysis and if her legs are "hot", there is concern for infection. I would return to ED, unfortunately.

## 2016-12-09 NOTE — Telephone Encounter (Signed)
Looks like she may have a UTI and was started on Keflex. This can help treat if any skin infection as well, but we don't know without seeing her what is going on. The fact that she can't walk is concerning; it sounds like this may be due to significant edema. Per ED notes, she was last dialyzed Friday, so today is the 5th day without dialysis (unless she went today? ).

## 2016-12-09 NOTE — Telephone Encounter (Signed)
Patient called in stating she was at the ER yesterday and the ER doctor at [facility] stated he didn't think it looked that bad.  Her legs are still hurting pretty badly, however she doesn't feel like she needs to return right now.    She's on an Abx and would like to see if that helps or not.

## 2016-12-10 NOTE — Telephone Encounter (Signed)
Wanda [name], see if the patient can see EG tomorrow at 11 am

## 2016-12-11 LAB — URINE CULTURE

## 2016-12-12 ENCOUNTER — Telehealth: Payer: Self-pay

## 2016-12-12 NOTE — Telephone Encounter (Signed)
Post ED Visit - Positive Culture Follow-up: Unsuccessful Patient Follow-up  Culture assessed and recommendations reviewed by:  []  Elenor Quinones, Pharm.D. []  Heide Guile, Pharm.D., BCPS AQ-ID []  Parks Neptune, Pharm.D., BCPS []  Alycia Rossetti, Pharm.D., BCPS []  [place], Pharm.D., BCPS, AAHIVP []  Legrand Como, Pharm.D., BCPS, AAHIVP []  Salome Arnt, PharmD, BCPS []  Dimitri Ped, PharmD, BCPS []  [place] Hews, PharmD, BCPS Willis Modena PharmD Positive urine culture  []  Patient discharged without antimicrobial prescription and treatment is now indicated [x]  Organism is resistant to prescribed ED discharge antimicrobial []  Patient with positive blood cultures   Unable to contact patient after 3 attempts, letter will be sent to address on file  Genia Del 12/12/2016, 10:26 AM

## 2016-12-12 NOTE — Progress Notes (Signed)
ED Antimicrobial Stewardship Positive Culture Follow Up   Wanda [name] is an 41 y.o. female who presented to [facility] on 12/08/2016 with a chief complaint of  Chief Complaint  Patient presents with  . Groin Pain    Recent Results (from the past 720 hour(s))  MRSA PCR Screening     Status: None   Collection Time: 11/20/16  9:45 PM  Result Value Ref Range Status   MRSA by PCR NEGATIVE NEGATIVE Final    Comment:        The GeneXpert MRSA Assay (FDA approved for NASAL specimens only), is one component of a comprehensive MRSA colonization surveillance program. It is not intended to diagnose MRSA infection nor to guide or monitor treatment for MRSA infections.   Urine culture     Status: Abnormal   Collection Time: 12/08/16  4:47 PM  Result Value Ref Range Status   Specimen Description URINE, RANDOM  Final   Special Requests NONE  Final   Culture >=100,000 COLONIES/mL CITROBACTER AMALONATICUS (A)  Final   Report Status 12/11/2016 FINAL  Final   Organism ID, Bacteria CITROBACTER AMALONATICUS (A)  Final      Susceptibility   Citrobacter amalonaticus - MIC*    CEFAZOLIN >=64 RESISTANT Resistant     CEFTRIAXONE >=64 RESISTANT Resistant     CIPROFLOXACIN <=0.25 SENSITIVE Sensitive     GENTAMICIN <=1 SENSITIVE Sensitive     IMIPENEM <=0.25 SENSITIVE Sensitive     NITROFURANTOIN 128 RESISTANT Resistant     TRIMETH/SULFA <=20 SENSITIVE Sensitive     PIP/TAZO 8 SENSITIVE Sensitive     * >=100,000 COLONIES/mL CITROBACTER AMALONATICUS    [x]  Treated with cephalexin, organism resistant to prescribed antimicrobial []  Patient discharged originally without antimicrobial agent and treatment is now indicated  New antibiotic prescription: dc cephalexin, start levofloxacin 250 mg every other day x 3 doses  ED Provider: Margarita Mail, PA-C  Wynell Balloon 12/12/2016, 8:58 AM Infectious Diseases Pharmacist

## 2016-12-13 ENCOUNTER — Inpatient Hospital Stay (HOSPITAL_COMMUNITY)
Admission: EM | Admit: 2016-12-13 | Discharge: 2016-12-21 | DRG: 640 | Disposition: A | Discharge status: Home / Self Care | Payer: Medicare Other | Attending: Family Medicine | Admitting: Family Medicine

## 2016-12-13 ENCOUNTER — Encounter (HOSPITAL_COMMUNITY): Payer: Self-pay | Admitting: *Deleted

## 2016-12-13 ENCOUNTER — Emergency Department (HOSPITAL_COMMUNITY): Payer: Medicare Other

## 2016-12-13 DIAGNOSIS — R188 Other ascites: Secondary | ICD-10-CM | POA: Diagnosis present

## 2016-12-13 DIAGNOSIS — D696 Thrombocytopenia, unspecified: Secondary | ICD-10-CM | POA: Diagnosis present

## 2016-12-13 DIAGNOSIS — D638 Anemia in other chronic diseases classified elsewhere: Secondary | ICD-10-CM | POA: Diagnosis not present

## 2016-12-13 DIAGNOSIS — K3189 Other diseases of stomach and duodenum: Secondary | ICD-10-CM | POA: Diagnosis present

## 2016-12-13 DIAGNOSIS — G909 Disorder of the autonomic nervous system, unspecified: Secondary | ICD-10-CM | POA: Diagnosis present

## 2016-12-13 DIAGNOSIS — Z9119 Patient's noncompliance with other medical treatment and regimen: Secondary | ICD-10-CM

## 2016-12-13 DIAGNOSIS — Z8249 Family history of ischemic heart disease and other diseases of the circulatory system: Secondary | ICD-10-CM

## 2016-12-13 DIAGNOSIS — N3000 Acute cystitis without hematuria: Secondary | ICD-10-CM | POA: Diagnosis present

## 2016-12-13 DIAGNOSIS — Z9115 Patient's noncompliance with renal dialysis: Secondary | ICD-10-CM | POA: Diagnosis not present

## 2016-12-13 DIAGNOSIS — R601 Generalized edema: Secondary | ICD-10-CM | POA: Diagnosis present

## 2016-12-13 DIAGNOSIS — E8779 Other fluid overload: Secondary | ICD-10-CM | POA: Diagnosis not present

## 2016-12-13 DIAGNOSIS — M898X9 Other specified disorders of bone, unspecified site: Secondary | ICD-10-CM | POA: Diagnosis present

## 2016-12-13 DIAGNOSIS — F319 Bipolar disorder, unspecified: Secondary | ICD-10-CM | POA: Diagnosis present

## 2016-12-13 DIAGNOSIS — D631 Anemia in chronic kidney disease: Secondary | ICD-10-CM | POA: Diagnosis present

## 2016-12-13 DIAGNOSIS — K7460 Unspecified cirrhosis of liver: Secondary | ICD-10-CM | POA: Diagnosis present

## 2016-12-13 DIAGNOSIS — Z992 Dependence on renal dialysis: Secondary | ICD-10-CM

## 2016-12-13 DIAGNOSIS — E8770 Fluid overload, unspecified: Principal | ICD-10-CM | POA: Diagnosis present

## 2016-12-13 DIAGNOSIS — I8510 Secondary esophageal varices without bleeding: Secondary | ICD-10-CM | POA: Diagnosis present

## 2016-12-13 DIAGNOSIS — K429 Umbilical hernia without obstruction or gangrene: Secondary | ICD-10-CM | POA: Diagnosis present

## 2016-12-13 DIAGNOSIS — K7581 Nonalcoholic steatohepatitis (NASH): Secondary | ICD-10-CM | POA: Diagnosis present

## 2016-12-13 DIAGNOSIS — Z888 Allergy status to other drugs, medicaments and biological substances status: Secondary | ICD-10-CM

## 2016-12-13 DIAGNOSIS — K766 Portal hypertension: Secondary | ICD-10-CM | POA: Diagnosis present

## 2016-12-13 DIAGNOSIS — Z87891 Personal history of nicotine dependence: Secondary | ICD-10-CM

## 2016-12-13 DIAGNOSIS — I953 Hypotension of hemodialysis: Secondary | ICD-10-CM | POA: Diagnosis present

## 2016-12-13 DIAGNOSIS — K219 Gastro-esophageal reflux disease without esophagitis: Secondary | ICD-10-CM | POA: Diagnosis present

## 2016-12-13 DIAGNOSIS — N186 End stage renal disease: Secondary | ICD-10-CM | POA: Diagnosis present

## 2016-12-13 DIAGNOSIS — Z91040 Latex allergy status: Secondary | ICD-10-CM | POA: Diagnosis not present

## 2016-12-13 LAB — BASIC METABOLIC PANEL
Anion gap: 21 — ABNORMAL HIGH (ref 5–15)
BUN: 80 mg/dL — ABNORMAL HIGH (ref 6–20)
CHLORIDE: 95 mmol/L — AB (ref 101–111)
CO2: 19 mmol/L — AB (ref 22–32)
Calcium: 8.5 mg/dL — ABNORMAL LOW (ref 8.9–10.3)
Creatinine, Ser: 12.58 mg/dL — ABNORMAL HIGH (ref 0.44–1.00)
GFR calc non Af Amer: 3 mL/min — ABNORMAL LOW (ref >60)
GFR, EST AFRICAN AMERICAN: 4 mL/min — AB (ref >60)
Glucose, Bld: 101 mg/dL — ABNORMAL HIGH (ref 65–99)
POTASSIUM: 4.8 mmol/L (ref 3.5–5.1)
Sodium: 135 mmol/L (ref 135–145)

## 2016-12-13 LAB — RENAL FUNCTION PANEL
ALBUMIN: 2.8 g/dL — AB (ref 3.5–5.0)
Anion gap: 24 — ABNORMAL HIGH (ref 5–15)
BUN: 82 mg/dL — AB (ref 6–20)
CALCIUM: 8.2 mg/dL — AB (ref 8.9–10.3)
CO2: 16 mmol/L — AB (ref 22–32)
Chloride: 96 mmol/L — ABNORMAL LOW (ref 101–111)
Creatinine, Ser: 12.81 mg/dL — ABNORMAL HIGH (ref 0.44–1.00)
GFR calc Af Amer: 4 mL/min — ABNORMAL LOW (ref >60)
GFR calc non Af Amer: 3 mL/min — ABNORMAL LOW (ref >60)
GLUCOSE: 98 mg/dL (ref 65–99)
PHOSPHORUS: 9 mg/dL — AB (ref 2.5–4.6)
Potassium: 5 mmol/L (ref 3.5–5.1)
SODIUM: 136 mmol/L (ref 135–145)

## 2016-12-13 LAB — CBC WITH DIFFERENTIAL/PLATELET
Basophils Absolute: 0.1 10*3/uL (ref 0.0–0.1)
Basophils Relative: 2 %
EOS ABS: 0.2 10*3/uL (ref 0.0–0.7)
Eosinophils Relative: 3 %
HCT: 25.2 % — ABNORMAL LOW (ref 36.0–46.0)
HEMOGLOBIN: 8.2 g/dL — AB (ref 12.0–15.0)
LYMPHS ABS: 0.8 10*3/uL (ref 0.7–4.0)
Lymphocytes Relative: 12 %
MCH: 30.8 pg (ref 26.0–34.0)
MCHC: 32.5 g/dL (ref 30.0–36.0)
MCV: 94.7 fL (ref 78.0–100.0)
MONO ABS: 0.7 10*3/uL (ref 0.1–1.0)
MONOS PCT: 10 %
NEUTROS ABS: 5.1 10*3/uL (ref 1.7–7.7)
NEUTROS PCT: 73 %
Platelets: 226 10*3/uL (ref 150–400)
RBC: 2.66 MIL/uL — ABNORMAL LOW (ref 3.87–5.11)
RDW: 17.3 % — AB (ref 11.5–15.5)
WBC: 7 10*3/uL (ref 4.0–10.5)

## 2016-12-13 LAB — HEPATIC FUNCTION PANEL
ALBUMIN: 2.8 g/dL — AB (ref 3.5–5.0)
ALT: 16 U/L (ref 14–54)
AST: 32 U/L (ref 15–41)
Alkaline Phosphatase: 149 U/L — ABNORMAL HIGH (ref 38–126)
Bilirubin, Direct: 0.3 mg/dL (ref 0.1–0.5)
Indirect Bilirubin: 0.8 mg/dL (ref 0.3–0.9)
TOTAL PROTEIN: 6.4 g/dL — AB (ref 6.5–8.1)
Total Bilirubin: 1.1 mg/dL (ref 0.3–1.2)

## 2016-12-13 LAB — CK: CK TOTAL: 95 U/L (ref 38–234)

## 2016-12-13 LAB — AMMONIA: AMMONIA: 54 umol/L — AB (ref 9–35)

## 2016-12-13 LAB — LACTIC ACID, PLASMA: Lactic Acid, Venous: 2.7 mmol/L (ref 0.5–1.9)

## 2016-12-13 MED ORDER — CIPROFLOXACIN IN D5W 200 MG/100ML IV SOLN
200.0000 mg | Freq: Once | INTRAVENOUS | Status: AC
  Administered 2016-12-13: 200 mg via INTRAVENOUS
  Filled 2016-12-13: qty 100

## 2016-12-13 MED ORDER — SEVELAMER CARBONATE 800 MG PO TABS
1600.0000 mg | ORAL_TABLET | Freq: Three times a day (TID) | ORAL | Status: DC
  Administered 2016-12-13 – 2016-12-14 (×3): 1600 mg via ORAL
  Filled 2016-12-13 (×3): qty 2

## 2016-12-13 MED ORDER — MIDODRINE HCL 5 MG PO TABS
10.0000 mg | ORAL_TABLET | Freq: Every morning | ORAL | Status: DC
  Administered 2016-12-13 – 2016-12-14 (×2): 10 mg via ORAL
  Filled 2016-12-13 (×2): qty 2

## 2016-12-13 MED ORDER — LACTULOSE 10 GM/15ML PO SOLN
10.0000 g | Freq: Three times a day (TID) | ORAL | Status: DC
  Administered 2016-12-16 (×2): 10 g via ORAL
  Administered 2016-12-17 – 2016-12-18 (×3): 20 g via ORAL
  Administered 2016-12-19 (×3): 10 g via ORAL
  Administered 2016-12-20 (×2): 20 g via ORAL
  Filled 2016-12-13 (×16): qty 30

## 2016-12-13 MED ORDER — SODIUM CHLORIDE 0.9 % IV SOLN: 100.0000 mL | INTRAVENOUS | Status: DC | PRN

## 2016-12-13 MED ORDER — ACETAMINOPHEN 650 MG RE SUPP: 650.0000 mg | Freq: Four times a day (QID) | RECTAL | Status: DC | PRN

## 2016-12-13 MED ORDER — SUCRALFATE 1 GM/10ML PO SUSP
1.0000 g | Freq: Three times a day (TID) | ORAL | Status: DC
  Administered 2016-12-13 – 2016-12-21 (×32): 1 g via ORAL
  Filled 2016-12-13 (×31): qty 10

## 2016-12-13 MED ORDER — HEPARIN SODIUM (PORCINE) 1000 UNIT/ML DIALYSIS
1000.0000 [IU] | INTRAMUSCULAR | Status: DC | PRN
  Filled 2016-12-13: qty 1

## 2016-12-13 MED ORDER — ACETAMINOPHEN 325 MG PO TABS: 650.0000 mg | ORAL_TABLET | Freq: Four times a day (QID) | ORAL | Status: DC | PRN

## 2016-12-13 MED ORDER — OXYCODONE HCL 5 MG PO TABS
5.0000 mg | ORAL_TABLET | Freq: Four times a day (QID) | ORAL | Status: DC | PRN
  Administered 2016-12-13 – 2016-12-15 (×9): 5 mg via ORAL
  Filled 2016-12-13 (×9): qty 1

## 2016-12-13 MED ORDER — CLOTRIMAZOLE 1 % EX CREA
1.0000 "application " | TOPICAL_CREAM | Freq: Three times a day (TID) | CUTANEOUS | Status: DC
  Administered 2016-12-18 – 2016-12-19 (×3): 1 via TOPICAL
  Filled 2016-12-13: qty 15

## 2016-12-13 MED ORDER — LIDOCAINE-PRILOCAINE 2.5-2.5 % EX CREA: 1.0000 "application " | TOPICAL_CREAM | CUTANEOUS | Status: DC | PRN

## 2016-12-13 MED ORDER — HEPARIN SODIUM (PORCINE) 5000 UNIT/ML IJ SOLN
5000.0000 [IU] | Freq: Three times a day (TID) | INTRAMUSCULAR | Status: DC
  Administered 2016-12-14: 5000 [IU] via SUBCUTANEOUS
  Filled 2016-12-13 (×5): qty 1

## 2016-12-13 MED ORDER — ONDANSETRON HCL 4 MG PO TABS
4.0000 mg | ORAL_TABLET | Freq: Four times a day (QID) | ORAL | Status: DC | PRN
  Administered 2016-12-21: 4 mg via ORAL
  Filled 2016-12-13: qty 1

## 2016-12-13 MED ORDER — ALTEPLASE 2 MG IJ SOLR
2.0000 mg | Freq: Once | INTRAMUSCULAR | Status: DC | PRN
  Filled 2016-12-13: qty 2

## 2016-12-13 MED ORDER — PENTAFLUOROPROP-TETRAFLUOROETH EX AERO: 1.0000 "application " | INHALATION_SPRAY | CUTANEOUS | Status: DC | PRN

## 2016-12-13 MED ORDER — ONDANSETRON HCL 4 MG/2ML IJ SOLN
4.0000 mg | Freq: Four times a day (QID) | INTRAMUSCULAR | Status: DC | PRN
  Administered 2016-12-14 – 2016-12-17 (×2): 4 mg via INTRAVENOUS
  Filled 2016-12-13 (×2): qty 2

## 2016-12-13 MED ORDER — CIPROFLOXACIN HCL 250 MG PO TABS
500.0000 mg | ORAL_TABLET | Freq: Every day | ORAL | Status: DC
  Filled 2016-12-13 (×3): qty 2

## 2016-12-13 MED ORDER — PANTOPRAZOLE SODIUM 40 MG PO TBEC
40.0000 mg | DELAYED_RELEASE_TABLET | Freq: Every day | ORAL | Status: DC
  Filled 2016-12-13 (×6): qty 1

## 2016-12-13 MED ORDER — SODIUM CHLORIDE 0.9 % IV SOLN: 250.0000 mL | INTRAVENOUS | Status: DC | PRN

## 2016-12-13 MED ORDER — CIPROFLOXACIN IN D5W 400 MG/200ML IV SOLN: 400.0000 mg | Freq: Once | INTRAVENOUS | Status: DC

## 2016-12-13 MED ORDER — LIDOCAINE VISCOUS 2 % MT SOLN
15.0000 mL | Freq: Three times a day (TID) | OROMUCOSAL | Status: DC
  Administered 2016-12-13 – 2016-12-21 (×23): 15 mL via OROMUCOSAL
  Filled 2016-12-13 (×25): qty 15

## 2016-12-13 MED ORDER — CIPROFLOXACIN HCL 250 MG PO TABS: 500.0000 mg | ORAL_TABLET | Freq: Every day | ORAL | Status: DC

## 2016-12-13 MED ORDER — SODIUM CHLORIDE 0.9% FLUSH: 3.0000 mL | INTRAVENOUS | Status: DC | PRN

## 2016-12-13 MED ORDER — EPOETIN ALFA 10000 UNIT/ML IJ SOLN
INTRAMUSCULAR | Status: AC
  Administered 2016-12-13: 10000 [IU] via INTRAVENOUS
  Filled 2016-12-13: qty 1

## 2016-12-13 MED ORDER — HYDROCODONE-ACETAMINOPHEN 5-325 MG PO TABS
1.0000 | ORAL_TABLET | Freq: Once | ORAL | Status: AC
  Administered 2016-12-13: 1 via ORAL
  Filled 2016-12-13: qty 1

## 2016-12-13 MED ORDER — LORAZEPAM 1 MG PO TABS
1.0000 mg | ORAL_TABLET | Freq: Two times a day (BID) | ORAL | Status: DC | PRN
  Administered 2016-12-14 – 2016-12-21 (×9): 1 mg via ORAL
  Filled 2016-12-13 (×11): qty 1

## 2016-12-13 MED ORDER — SODIUM CHLORIDE 0.9% FLUSH
3.0000 mL | Freq: Two times a day (BID) | INTRAVENOUS | Status: DC
  Administered 2016-12-13 – 2016-12-21 (×16): 3 mL via INTRAVENOUS

## 2016-12-13 MED ORDER — LIDOCAINE HCL (PF) 1 % IJ SOLN: 5.0000 mL | INTRAMUSCULAR | Status: DC | PRN

## 2016-12-13 MED ORDER — EPOETIN ALFA 10000 UNIT/ML IJ SOLN
10000.0000 [IU] | Freq: Once | INTRAMUSCULAR | Status: AC
  Administered 2016-12-13: 10000 [IU] via INTRAVENOUS
  Filled 2016-12-13: qty 1

## 2016-12-13 NOTE — Progress Notes (Signed)
Pt refusing heparin and Lactulose for nightly medications. Pt educated that heparin was to prevent DVT and the Lactulose was for her to have a BM so her ammonia level would not get high causing her to become confused. Pt stated, " I won't be here long enough to get a blood clot, and I know what not taking Lactulose does. I'm not stupid." Told pt she had the right to refuse, but I was documenting it. Pt verbalized understanding. Will continue to monitor pt.

## 2016-12-13 NOTE — ED Triage Notes (Signed)
Pt brought in by [facility] for c/o right thigh pain and needs dialysis; pt has not had dialysis in 5 days

## 2016-12-13 NOTE — H&P (Addendum)
History and Physical    Wanda [name]:[id number] DOB: 04/17/1976 DOA: 12/13/2016  PCP: [name], MD  Patient coming from: home  I have personally briefly reviewed patient's old medical records in [place]  Chief Complaint: pain in right leg  HPI: Wanda [name] is a 41 y.o. female with medical history significant of end-stage renal disease, cirrhosis.[name] She was recently discharged from hospital on 4/19 being treated for pulmonary edema and volume overload. The patient has a long history of noncompliance with dialysis. Since her discharge, patient has not come to the dialysis center. She comes back today with significant right lower extremity pain and overall worsening edema. She denies any fever. She has a chronic cough. No vomiting or diarrhea. She reports being compliant with lactulose. When asked why she has been coming to her dialysis center, patient initially said that she could not come due to pain in her leg, then said that she thought that she was not allowed to come since she had been irregular with dialysis.  ED Course: In the emergency room she was noted to be grossly volume overloaded. Chest x-ray does show some vascular congestion. Vitals are otherwise stable. She's been referred for admission  Review of Systems: As per HPI otherwise 10 point review of systems negative.    Past Medical History:  Diagnosis Date  . Acute blood loss anemia 02/25/2014   Status post transfusion  . Acute renal failure ([place]) 09/2013   Pre-renal- resolved  . Anasarca 10/10/2013  . Anxiety   . Bipolar disorder ([place]) 12/04/2013   2007-SEEN IN ED FOR INVOLUNTARY COMMITMENT, UDS POS FOR AMPHETAMINES/OPIATES   . Bleeding esophageal varices ([place]) 02/28/2014   s/p banding  . C. difficile colitis 04/19/2014  . Chronic hypotension   . Cirrhosis ([place]) 10/05/13   Liver bx 11/23/13 (delayed initially due to patient refusal). c/w steatohepatitis  . Cirrhosis of liver with ascites ([place])   .  Depression   . ESRD (end stage renal disease) on dialysis ([place]) 08/2014  . Folate deficiency 09/2013  . Gastroesophageal junction ulcer 09/17/2014  . GERD (gastroesophageal reflux disease)   . Hematemesis/vomiting blood 02/24/2014  . Macrocytosis 02/28/2014  . PNA (pneumonia) 10/13/2013  . SBP (spontaneous bacterial peritonitis) ([place]) 11/10/2013  . Thrombocytopenia ([place])    Hypercellular bone marrow; abundant megakaryocytes per 08/27/2014; s/p bone marrow bx    Past Surgical History:  Procedure Laterality Date  . AV FISTULA PLACEMENT Right 11/16/2014   Procedure: Right arm Creation of arteriovenous fistula;  Surgeon: Angelia Mould, MD;  Location: [facility];  Service: Vascular;  Laterality: Right;  . CENTRAL VENOUS CATHETER INSERTION Right   . COLONOSCOPY N/A 12/19/2013   SLF:NO OBVIOUS SOURCE FOR ANEMIA IDETIFIED/ONE COLON POLYP REMOVED/Small internal hemorrhoids  . ESOPHAGEAL BANDING  07/04/2014   Procedure: ESOPHAGEAL BANDING;  Surgeon: Daneil Dolin, MD;  [facility];  Service: Endoscopy;;  . ESOPHAGEAL BANDING N/A 07/24/2014   Procedure: ESOPHAGEAL BANDING (2 bands applied);  Surgeon: Danie Binder, MD;  Location: [facility];  Service: Endoscopy;  Laterality: N/A;  . ESOPHAGEAL BANDING N/A 07/23/2015   Procedure: ESOPHAGEAL BANDING;  Surgeon: Danie Binder, MD;  [facility];  Service: Endoscopy;  Laterality: N/A;  . ESOPHAGEAL BANDING N/A 03/04/2016   Procedure: ESOPHAGEAL BANDING;  Surgeon: Danie Binder, MD;  [facility];  Service: Endoscopy;  Laterality: N/A;  . ESOPHAGEAL BANDING N/A 04/30/2016   Procedure: ESOPHAGEAL BANDING;  Surgeon: Daneil Dolin,  MD;  [facility];  Service: Endoscopy;  Laterality: N/A;  . ESOPHAGOGASTRODUODENOSCOPY N/A 11/14/2013   SLF:1 column of very small varices in distal esopahgus/MODERATE PORTAL GASTROPATHY IN PROXIMAL STOMACH/MODERATE erosive gastritis  . ESOPHAGOGASTRODUODENOSCOPY N/A 02/11/2014   Dr.  Rourk:Esophageal varices with bleeding stigmata-status post esophageal band ligation therapy. Portal gastropathy  . ESOPHAGOGASTRODUODENOSCOPY N/A 07/04/2014   RMR: Persiting grade 2 esophageal varicies with bleeding stigmata status post band ligation. Significantly congested gastric mucosa iwith changes constistant with protal gastropathy.   . ESOPHAGOGASTRODUODENOSCOPY N/A 09/16/2014   Rehman: Single short column of varix proximal to GE junction not large enough to be banded. Two amall ulcers at the GEJ felt to be source of GI Bleeding but no active bleeding but no actibe bleeding noted. No therapy rendered. Portal gastropathy NO evidence of peptic ulcer diease or gastric varices.   . ESOPHAGOGASTRODUODENOSCOPY N/A 12/14/2014   SLF: Grade ! esophageal varices. 2. Moderate Portal Gastropathy  . ESOPHAGOGASTRODUODENOSCOPY N/A 03/27/2015   Procedure: ESOPHAGOGASTRODUODENOSCOPY (EGD);  Surgeon: Danie Binder, MD;  [facility];  Service: Endoscopy;  Laterality: N/A;  1045am - moved to 817 @ 11:30  . ESOPHAGOGASTRODUODENOSCOPY N/A 06/05/2015   Procedure: ESOPHAGOGASTRODUODENOSCOPY (EGD);  Surgeon: Clarene Essex, MD;  Location[facility];  Service: Endoscopy;  Laterality: N/A;  . ESOPHAGOGASTRODUODENOSCOPY N/A 07/23/2015   SLF:1. Grade 1 esophageal varices 2. Moderate portal hypertensive gastropathy 3. MILd non-erosive gastritis.   [name] ESOPHAGOGASTRODUODENOSCOPY N/A 03/04/2016   Dr. Oneida Alar: grade 1 varices, surveillance in Jan 2017   . ESOPHAGOGASTRODUODENOSCOPY N/A 05/21/2016   Dr. Carlean Purl: non-bleeding esophageal ulcer, likely secondary to MW tear  . ESOPHAGOGASTRODUODENOSCOPY (EGD) WITH PROPOFOL N/A 07/24/2014   SLF:  1. 2 columns grade 2-3 varices- 2 Bands applied.  2.  Moderate gastropathy 3. Duodenal Diverticula  . ESOPHAGOGASTRODUODENOSCOPY (EGD) WITH PROPOFOL N/A 10/26/2014   RMR: 2 columns of grade 2 esophageal varices without obvious bleeding stigmata status post band ligation to complet  obliteration of remaining varices.   . ESOPHAGOGASTRODUODENOSCOPY (EGD) WITH PROPOFOL N/A 04/30/2016   Dr. Gala Romney: MW tear, varices obliterated, portal gastropathy  . None    . PARACENTESIS  Feb 2015   1180 fluid, negative fluid analysis.   [name] PARACENTESIS  10/2013     reports that she quit smoking about 8 years ago. Her smoking use included Cigarettes. She has a 5.00 pack-year smoking history. She has never used smokeless tobacco. She reports that she does not drink alcohol or use drugs.  Allergies  Allergen Reactions  . Iohexol Rash    Pt states rash after last CT. Pt needs pre-meds  . Lasix [Furosemide] Other (See Comments)    "doesn't work"  . Latex Itching    Family History  Problem Relation Age of Onset  . Heart disease Mother   . Colon cancer Neg Hx   . Liver disease Neg Hx     Prior to Admission medications   Medication Sig Start Date End Date Taking? Authorizing Provider  clotrimazole (LOTRIMIN) 1 % cream Apply 1 application topically 3 (three) times daily. 09/30/16  Yes Fields, Marga Melnick, MD  Darbepoetin Alfa (ARANESP) 60 MCG/0.3ML SOSY injection Inject 0.3 mLs (60 mcg total) into the vein every Thursday with hemodialysis. Patient taking differently: Inject 60 mcg into the vein every Tuesday with hemodialysis.  06/09/15  Yes Cherene Altes, MD  lactulose (CHRONULAC) 10 GM/15ML solution Take 45 mLs (30 g total) by mouth 3 (three) times daily. TAKE 15-30 ml BY MOUTH three times  a day Patient taking differently: Take 10-20 g by mouth 3 (three) times daily. TAKE 15-30 ml BY MOUTH three times a day 09/27/16  Yes Cherene Altes, MD  lidocaine (XYLOCAINE) 2 % solution TAKE TWO TEASPOONSFUL (10ML) BY MOUTH BEFORE MEALS AND AT BEDTIME TO PREVENT CHEST PAIN WHILE EATING 03/19/16  Yes Carlis Stable, NP  lidocaine-prilocaine (EMLA) cream Apply 1 application topically as needed (for dialysis treatments).   Yes [provider]  LORazepam (ATIVAN) 1 MG tablet Take 1 mg by mouth  2 (two) times daily as needed for anxiety (and/or back spasms).   Yes [provider]  midodrine (PROAMATINE) 10 MG tablet Take 1 tablet (10 mg total) by mouth every morning. Patient taking differently: Take 10 mg by mouth See admin instructions. Take ONLY on dialysis days 09/28/16  Yes Cherene Altes, MD  omeprazole (PRILOSEC) 20 MG capsule 1 po every morning 30 minutes prior to your first meal. Patient taking differently: Take 20 mg by mouth daily before breakfast. 1 po every morning 30 minutes prior to your first meal. 09/30/16  Yes Fields, Sandi L, MD  Oxycodone HCl 10 MG TABS Take 0.5 tablets (5 mg total) by mouth 3 (three) times daily as needed. 1 PO TID AS NEEDED FOR PAIN Patient taking differently: Take 5-10 mg by mouth 3 (three) times daily as needed.  09/27/16  Yes Cherene Altes, MD  RENVELA 800 MG tablet Take 1600 mg by mouth 3 times daily with meals. Take 800 mg by mouth with snacks. 10/29/15  Yes [provider]  sucralfate (CARAFATE) 1 GM/10ML suspension Take 1 g by mouth 4 (four) times daily -  with meals and at bedtime.   Yes [provider]    Physical Exam: Vitals:   12/13/16 0430 12/13/16 0433 12/13/16 0626 12/13/16 0758  BP: (!) 114/55  (!) 109/56 (!) 109/49  Pulse: (!) 106  94 (!) 101  Resp: 18  17 20   Temp:    98.4 F (36.9 C)  TempSrc:    Oral  SpO2: 98% 100% 98% 100%  Weight:    77 kg (169 lb 12.1 oz)  Height:    5\' 1"  (1.549 m)    Constitutional: NAD, calm, comfortable Vitals:   12/13/16 0430 12/13/16 0433 12/13/16 0626 12/13/16 0758  BP: (!) 114/55  (!) 109/56 (!) 109/49  Pulse: (!) 106  94 (!) 101  Resp: 18  17 20   Temp:    98.4 F (36.9 C)  TempSrc:    Oral  SpO2: 98% 100% 98% 100%  Weight:    77 kg (169 lb 12.1 oz)  Height:    5\' 1"  (1.549 m)   Eyes: PERRL, lids and conjunctivae normal ENMT: Mucous membranes are moist. Posterior pharynx clear of any exudate or lesions.Normal dentition.  Neck: normal, supple, no  masses, no thyromegaly Respiratory: clear to auscultation bilaterally, no wheezing, no crackles. Normal respiratory effort. No accessory muscle use.  Cardiovascular: tachycardic, regular, no murmurs / rubs / gallops. 3+ extremity edema. 2+ pedal pulses. No carotid bruits.  Abdomen: no tenderness, no masses palpated. No hepatosplenomegaly. Bowel sounds positive. Abdomen is distended and tight Musculoskeletal: no clubbing / cyanosis. No joint deformity upper and lower extremities. Good ROM, no contractures. Normal muscle tone.  Skin: no rashes, lesions, ulcers. Skin is tight in legs due to edema Neurologic: CN 2-12 grossly intact. Sensation intact, DTR normal. Strength 5/5 in all 4.  Psychiatric: Normal judgment and insight. Alert and oriented x  3. Normal mood.   Labs on Admission: I have personally reviewed following labs and imaging studies  CBC:  Recent Labs Lab 12/08/16 1445 12/13/16 0403  WBC 5.9 7.0  NEUTROABS 4.1 5.1  HGB 7.6* 8.2*  HCT 23.6* 25.2*  MCV 97.9 94.7  PLT 173 644   Basic Metabolic Panel:  Recent Labs Lab 12/08/16 1445 12/13/16 0403  NA 134* 135  K 3.6 4.8  CL 96* 95*  CO2 24 19*  GLUCOSE 107* 101*  BUN 49* 80*  CREATININE 8.46* 12.58*  CALCIUM 8.4* 8.5*   GFR: Estimated Creatinine Clearance: 5.5 mL/min (A) (by C-G formula based on SCr of 12.58 mg/dL (H)). Liver Function Tests:  Recent Labs Lab 12/13/16 0403  AST 32  ALT 16  ALKPHOS 149*  BILITOT 1.1  PROT 6.4*  ALBUMIN 2.8*   No results for input(s): LIPASE, AMYLASE in the last 168 hours.  Recent Labs Lab 12/13/16 0403  AMMONIA 54*   Coagulation Profile: No results for input(s): INR, PROTIME in the last 168 hours. Cardiac Enzymes:  Recent Labs Lab 12/13/16 0403  CKTOTAL 95   BNP (last 3 results) No results for input(s): PROBNP in the last 8760 hours. HbA1C: No results for input(s): HGBA1C in the last 72 hours. CBG: No results for input(s): GLUCAP in the last 168 hours. Lipid  Profile: No results for input(s): CHOL, HDL, LDLCALC, TRIG, CHOLHDL, LDLDIRECT in the last 72 hours. Thyroid Function Tests: No results for input(s): TSH, T4TOTAL, FREET4, T3FREE, THYROIDAB in the last 72 hours. Anemia Panel: No results for input(s): VITAMINB12, FOLATE, FERRITIN, TIBC, IRON, RETICCTPCT in the last 72 hours. Urine analysis:    Component Value Date/Time   COLORURINE AMBER (A) 12/08/2016 1502   APPEARANCEUR CLOUDY (A) 12/08/2016 1502   LABSPEC 1.016 12/08/2016 1502   PHURINE 5.0 12/08/2016 1502   GLUCOSEU NEGATIVE 12/08/2016 1502   HGBUR SMALL (A) 12/08/2016 1502   BILIRUBINUR NEGATIVE 12/08/2016 1502   KETONESUR NEGATIVE 12/08/2016 1502   PROTEINUR 100 (A) 12/08/2016 1502   UROBILINOGEN 0.2 08/13/2014 2138   NITRITE NEGATIVE 12/08/2016 1502   LEUKOCYTESUR MODERATE (A) 12/08/2016 1502    Radiological Exams on Admission: Dg Chest 2 View  Result Date: 12/13/2016 CLINICAL DATA:  Leg and abdominal swelling, no dialysis since April 28th. History of cirrhosis. EXAM: CHEST  2 VIEW COMPARISON:  Chest radiograph Dec 08, 2016 FINDINGS: Cardiac silhouette is mildly enlarged. Mediastinal silhouette is nonsuspicious. Calcified aortic knob. Similar pulmonary vascular congestion and chronic bronchitic changes without pleural effusion or focal consolidation Included abdomen demonstrates distention with bowel air-fluid levels at a similar level. IMPRESSION: Stable cardiomegaly and pulmonary vascular congestion. Mild bronchitic changes without focal consolidation. Suspected ascites. Electronically Signed   By: Elon Alas M.D.   On: 12/13/2016 05:29   Dg Hip Unilat W Or Wo Pelvis 2-3 Views Right  Result Date: 12/13/2016 CLINICAL DATA:  Leg and abdominal swelling, no dialysis since April 28th. History of cirrhosis. EXAM: DG HIP (WITH OR WITHOUT PELVIS) 2-3V RIGHT COMPARISON:  None. FINDINGS: There is no evidence of hip fracture or dislocation. There is no evidence of arthropathy or other  focal bone abnormality. Moderate vascular calcifications. Large body habitus. IMPRESSION: No acute osseous process. Electronically Signed   By: Elon Alas M.D.   On: 12/13/2016 05:30    EKG: Independently reviewed. Sinus tachycardia  Assessment/Plan Active Problems:   Anasarca   Bipolar disorder (HCC)   Anemia of chronic disease   Ascites of liver   Umbilical hernia  without obstruction and without gangrene   Liver cirrhosis secondary to NASH (nonalcoholic steatohepatitis) (HCC)   ESRD (end stage renal disease) (H[facility])   Volume overload    1. Massive volume overload and anasarca. Related to noncompliance with dialysis. Nephrology has been consulted and she will undergo dialysis today and another session tomorrow. We'll see if paracentesis will be helpful tomorrow for abdominal distention.  2. End-stage renal disease on hemodialysis. Nephrology following  3. Cirrhosis due to NASH with ascites. Possible paracentesis tomorrow.  4. Anemia of chronic disease. Hemoglobin appears to be no baseline. No evidence of bleeding.  5. UTI. Recent urine culture positive for Citrobacter. Treat the patient with ciprofloxacin.  DVT prophylaxis: heparin Code Status: full code Family Communication: no family present Disposition Plan: discharge home once improved Consults called: nephrology  Admission status: inpatient, Dollene Cleveland MD [facility] Pager [phone]  If 7PM-7AM, please contact night-coverage [email or web address] Password [email or web address]  12/13/2016, 10:42 AM

## 2016-12-13 NOTE — ED Notes (Signed)
Pt informed of need for urine sample. Pt states that she is unable to provide a urine sample at this time but was informed to let nursing staff know when able to provide one.

## 2016-12-13 NOTE — ED Notes (Signed)
Patient states she has not had dialysis since April 28. Legs and abdomen swollen with 4+ edema. RLE weeping. Patient co RLE pain in thigh.

## 2016-12-13 NOTE — ED Notes (Signed)
Pt unable to provide a urine sample due to being a dialysis pt and rarely urinating.

## 2016-12-13 NOTE — Plan of Care (Signed)
Problem: Pain Managment: Goal: General experience of comfort will improve Outcome: Not Progressing Pt c/o 10/10 pain in back, abdomen, and flank. Pt received Oxycodone q6h and requesting to get the pain medication earlier than 6 hours. Pt adv that q6h is the earliest she could have it and she has to ask for it because it is PRN and not scheduled also her liver function. Pt verbalized understanding. Will continue to monitor pt

## 2016-12-13 NOTE — ED Notes (Signed)
CRITICAL VALUE ALERT  Critical value received:Lactic acid  Date of notification:12/13/2016  Time of notification: 0510  Critical value read back:YES  Nurse who received alert:Kiam Bransfield RN  MD notified (1st page): Rancour  Time of first page: NA  MD notified (2nd page):  Time of second page:   Responding MD: Rancour  Time MD [id number]

## 2016-12-13 NOTE — Consult Note (Signed)
Reason for Consult: Fluid overload and end-stage renal disease Referring Physician: Dr. Beather Arbour Wanda [name] is an 41 y.o. female.  HPI: She is a a patient has history of bipolar disorder, liver cirrhosis, end-stage renal disease on maintenance hemodialysis presently came to the emergency room with complaints of leg pain, unable to walk, difficulty breathing for the last couple of days. Patient was here about 10 days ago for difficulty in breathing after missing her dialysis for about 2 weeks. After she was discharged from here patient didn't come to the dialysis unit. Her last dialysis was end of April. Presently she denies any nausea or vomiting. She complains of abdominal distention and leg swelling and difficulty walking.  Past Medical History:  Diagnosis Date  . Acute blood loss anemia 02/25/2014   Status post transfusion  . Acute renal failure ([place]) 09/2013   Pre-renal- resolved  . Anasarca 10/10/2013  . Anxiety   . Bipolar disorder ([place]) 12/04/2013   2007-SEEN IN ED FOR INVOLUNTARY COMMITMENT, UDS POS FOR AMPHETAMINES/OPIATES   . Bleeding esophageal varices ([place]) 02/28/2014   s/p banding  . C. difficile colitis 04/19/2014  . Chronic hypotension   . Cirrhosis ([place]) 10/05/13   Liver bx 11/23/13 (delayed initially due to patient refusal). c/w steatohepatitis  . Cirrhosis of liver with ascites ([place])   . Depression   . ESRD (end stage renal disease) on dialysis ([place]) 08/2014  . Folate deficiency 09/2013  . Gastroesophageal junction ulcer 09/17/2014  . GERD (gastroesophageal reflux disease)   . Hematemesis/vomiting blood 02/24/2014  . Macrocytosis 02/28/2014  . PNA (pneumonia) 10/13/2013  . SBP (spontaneous bacterial peritonitis) ([place]) 11/10/2013  . Thrombocytopenia ([place])    Hypercellular bone marrow; abundant megakaryocytes per 08/27/2014; s/p bone marrow bx    Past Surgical History:  Procedure Laterality Date  . AV FISTULA PLACEMENT Right 11/16/2014   Procedure: Right arm Creation of  arteriovenous fistula;  Surgeon: Angelia Mould, MD;  Location: [facility];  Service: Vascular;  Laterality: Right;  . CENTRAL VENOUS CATHETER INSERTION Right   . COLONOSCOPY N/A 12/19/2013   SLF:NO OBVIOUS SOURCE FOR ANEMIA IDETIFIED/ONE COLON POLYP REMOVED/Small internal hemorrhoids  . ESOPHAGEAL BANDING  07/04/2014   Procedure: ESOPHAGEAL BANDING;  Surgeon: Daneil Dolin, MD;  [facility];  Service: Endoscopy;;  . ESOPHAGEAL BANDING N/A 07/24/2014   Procedure: ESOPHAGEAL BANDING (2 bands applied);  Surgeon: Danie Binder, MD;  Location: [facility];  Service: Endoscopy;  Laterality: N/A;  . ESOPHAGEAL BANDING N/A 07/23/2015   Procedure: ESOPHAGEAL BANDING;  Surgeon: Danie Binder, MD;  [facility];  Service: Endoscopy;  Laterality: N/A;  . ESOPHAGEAL BANDING N/A 03/04/2016   Procedure: ESOPHAGEAL BANDING;  Surgeon: Danie Binder, MD;  [facility];  Service: Endoscopy;  Laterality: N/A;  . ESOPHAGEAL BANDING N/A 04/30/2016   Procedure: ESOPHAGEAL BANDING;  Surgeon: Daneil Dolin, MD;  [facility];  Service: Endoscopy;  Laterality: N/A;  . ESOPHAGOGASTRODUODENOSCOPY N/A 11/14/2013   SLF:1 column of very small varices in distal esopahgus/MODERATE PORTAL GASTROPATHY IN PROXIMAL STOMACH/MODERATE erosive gastritis  . ESOPHAGOGASTRODUODENOSCOPY N/A 02/11/2014   Dr. Rourk:Esophageal varices with bleeding stigmata-status post esophageal band ligation therapy. Portal gastropathy  . ESOPHAGOGASTRODUODENOSCOPY N/A 07/04/2014   RMR: Persiting grade 2 esophageal varicies with bleeding stigmata status post band ligation. Significantly congested gastric mucosa iwith changes constistant with protal gastropathy.   . ESOPHAGOGASTRODUODENOSCOPY N/A 09/16/2014   Rehman: Single short column of varix proximal to GE junction not  large enough to be banded. Two amall ulcers at the GEJ felt to be source of GI Bleeding but no active bleeding but no actibe bleeding noted. No  therapy rendered. Portal gastropathy NO evidence of peptic ulcer diease or gastric varices.   . ESOPHAGOGASTRODUODENOSCOPY N/A 12/14/2014   SLF: Grade ! esophageal varices. 2. Moderate Portal Gastropathy  . ESOPHAGOGASTRODUODENOSCOPY N/A 03/27/2015   Procedure: ESOPHAGOGASTRODUODENOSCOPY (EGD);  Surgeon: Danie Binder, MD;  [facility];  Service: Endoscopy;  Laterality: N/A;  1045am - moved to 817 @ 11:30  . ESOPHAGOGASTRODUODENOSCOPY N/A 06/05/2015   Procedure: ESOPHAGOGASTRODUODENOSCOPY (EGD);  Surgeon: Clarene Essex, MD;  Location: [facility] ENDOSCOPY;  Service: Endoscopy;  Laterality: N/A;  . ESOPHAGOGASTRODUODENOSCOPY N/A 07/23/2015   SLF:1. Grade 1 esophageal varices 2. Moderate portal hypertensive gastropathy 3. MILd non-erosive gastritis.   [name] ESOPHAGOGASTRODUODENOSCOPY N/A 03/04/2016   Dr. Oneida Alar: grade 1 varices, surveillance in Jan 2017   . ESOPHAGOGASTRODUODENOSCOPY N/A 05/21/2016   Dr. Carlean Purl: non-bleeding esophageal ulcer, likely secondary to MW tear  . ESOPHAGOGASTRODUODENOSCOPY (EGD) WITH PROPOFOL N/A 07/24/2014   SLF:  1. 2 columns grade 2-3 varices- 2 Bands applied.  2.  Moderate gastropathy 3. Duodenal Diverticula  . ESOPHAGOGASTRODUODENOSCOPY (EGD) WITH PROPOFOL N/A 10/26/2014   RMR: 2 columns of grade 2 esophageal varices without obvious bleeding stigmata status post band ligation to complet obliteration of remaining varices.   . ESOPHAGOGASTRODUODENOSCOPY (EGD) WITH PROPOFOL N/A 04/30/2016   Dr. Gala Romney: MW tear, varices obliterated, portal gastropathy  . None    . PARACENTESIS  Feb 2015   1180 fluid, negative fluid analysis.   [name] PARACENTESIS  10/2013    Family History  Problem Relation Age of Onset  . Heart disease Mother   . Colon cancer Neg Hx   . Liver disease Neg Hx     Social History:  reports that she quit smoking about 8 years ago. Her smoking use included Cigarettes. She has a 5.00 pack-year smoking history. She has never used smokeless tobacco. She reports  that she does not drink alcohol or use drugs.  Allergies:  Allergies  Allergen Reactions  . Iohexol Rash    Pt states rash after last CT. Pt needs pre-meds  . Lasix [Furosemide] Other (See Comments)    "doesn't work"  . Latex Itching    Medications: I have reviewed the patient's current medications.  Results for orders placed or performed during the hospital encounter of 12/13/16 (from the past 48 hour(s))  CBC with Differential/Platelet     Status: Abnormal   Collection Time: 12/13/16  4:03 AM  Result Value Ref Range   WBC 7.0 4.0 - 10.5 K/uL   RBC 2.66 (L) 3.87 - 5.11 MIL/uL   Hemoglobin 8.2 (L) 12.0 - 15.0 g/dL   HCT 25.2 (L) 36.0 - 46.0 %   MCV 94.7 78.0 - 100.0 fL   MCH 30.8 26.0 - 34.0 pg   MCHC 32.5 30.0 - 36.0 g/dL   RDW 17.3 (H) 11.5 - 15.5 %   Platelets 226 150 - 400 K/uL   Neutrophils Relative % 73 %   Neutro Abs 5.1 1.7 - 7.7 K/uL   Lymphocytes Relative 12 %   Lymphs Abs 0.8 0.7 - 4.0 K/uL   Monocytes Relative 10 %   Monocytes Absolute 0.7 0.1 - 1.0 K/uL   Eosinophils Relative 3 %   Eosinophils Absolute 0.2 0.0 - 0.7 K/uL   Basophils Relative 2 %   Basophils Absolute 0.1 0.0 - 0.1 K/uL  Basic  metabolic panel     Status: Abnormal   Collection Time: 12/13/16  4:03 AM  Result Value Ref Range   Sodium 135 135 - 145 mmol/L   Potassium 4.8 3.5 - 5.1 mmol/L   Chloride 95 (L) 101 - 111 mmol/L   CO2 19 (L) 22 - 32 mmol/L   Glucose, Bld 101 (H) 65 - 99 mg/dL   BUN 80 (H) 6 - 20 mg/dL   Creatinine, Ser 12.58 (H) 0.44 - 1.00 mg/dL   Calcium 8.5 (L) 8.9 - 10.3 mg/dL   GFR calc non Af Amer 3 (L) >60 mL/min   GFR calc Af Amer 4 (L) >60 mL/min    Comment: (NOTE) The eGFR has been calculated using the CKD EPI equation. This calculation has not been validated in all clinical situations. eGFR's persistently <60 mL/min signify possible Chronic Kidney Disease.    Anion gap 21 (H) 5 - 15  CK     Status: None   Collection Time: 12/13/16  4:03 AM  Result Value Ref Range    Total CK 95 38 - 234 U/L  Ammonia     Status: Abnormal   Collection Time: 12/13/16  4:03 AM  Result Value Ref Range   Ammonia 54 (H) 9 - 35 umol/L  Hepatic function panel     Status: Abnormal   Collection Time: 12/13/16  4:03 AM  Result Value Ref Range   Total Protein 6.4 (L) 6.5 - 8.1 g/dL   Albumin 2.8 (L) 3.5 - 5.0 g/dL   AST 32 15 - 41 U/L   ALT 16 14 - 54 U/L   Alkaline Phosphatase 149 (H) 38 - 126 U/L   Total Bilirubin 1.1 0.3 - 1.2 mg/dL   Bilirubin, Direct 0.3 0.1 - 0.5 mg/dL   Indirect Bilirubin 0.8 0.3 - 0.9 mg/dL  Blood culture (routine x 2)     Status: None (Preliminary result)   Collection Time: 12/13/16  4:17 AM  Result Value Ref Range   Specimen Description LEFT ANTECUBITAL    Special Requests      BOTTLES DRAWN AEROBIC AND ANAEROBIC Blood Culture results may not be optimal due to an excessive volume of blood received in culture bottles   Culture PENDING    Report Status PENDING   Blood culture (routine x 2)     Status: None (Preliminary result)   Collection Time: 12/13/16  4:26 AM  Result Value Ref Range   Specimen Description BLOOD RIGHT HAND    Special Requests      BOTTLES DRAWN AEROBIC ONLY Blood Culture results may not be optimal due to an inadequate volume of blood received in culture bottles   Culture PENDING    Report Status PENDING   Lactic acid, plasma     Status: Abnormal   Collection Time: 12/13/16  4:30 AM  Result Value Ref Range   Lactic Acid, Venous 2.7 (HH) 0.5 - 1.9 mmol/L    Comment: CRITICAL RESULT CALLED TO, READ BACK BY AND VERIFIED WITH:  HUNT,A @ 0514 ON 12/13/16 BY JUW     Dg Chest 2 View  Result Date: 12/13/2016 CLINICAL DATA:  Leg and abdominal swelling, no dialysis since April 28th. History of cirrhosis. EXAM: CHEST  2 VIEW COMPARISON:  Chest radiograph Dec 08, 2016 FINDINGS: Cardiac silhouette is mildly enlarged. Mediastinal silhouette is nonsuspicious. Calcified aortic knob. Similar pulmonary vascular congestion and chronic  bronchitic changes without pleural effusion or focal consolidation Included abdomen demonstrates distention with bowel air-fluid levels at  a similar level. IMPRESSION: Stable cardiomegaly and pulmonary vascular congestion. Mild bronchitic changes without focal consolidation. Suspected ascites. Electronically Signed   By: Elon Alas M.D.   On: 12/13/2016 05:29   Dg Hip Unilat W Or Wo Pelvis 2-3 Views Right  Result Date: 12/13/2016 CLINICAL DATA:  Leg and abdominal swelling, no dialysis since April 28th. History of cirrhosis. EXAM: DG HIP (WITH OR WITHOUT PELVIS) 2-3V RIGHT COMPARISON:  None. FINDINGS: There is no evidence of hip fracture or dislocation. There is no evidence of arthropathy or other focal bone abnormality. Moderate vascular calcifications. Large body habitus. IMPRESSION: No acute osseous process. Electronically Signed   By: Elon Alas M.D.   On: 12/13/2016 05:30    Review of Systems  Constitutional: Negative for chills and fever.  Respiratory: Positive for shortness of breath. Negative for cough and hemoptysis.   Cardiovascular: Positive for orthopnea and leg swelling.  Gastrointestinal: Negative for nausea and vomiting.  Musculoskeletal: Positive for joint pain.  Neurological: Positive for weakness.   Blood pressure (!) 109/49, pulse (!) 101, temperature 98.4 F (36.9 C), temperature source Oral, resp. rate 20, height _0  (1.549 m), weight 77 kg (169 lb 12.1 oz), last menstrual period 09/21/2013, SpO2 100 %. Physical Exam  Constitutional: She is oriented to person, place, and time. No distress.  Neck: JVD present.  Cardiovascular: Normal rate and regular rhythm.   Respiratory: No respiratory distress. She has wheezes.  GI: She exhibits distension. There is no tenderness. There is no rebound.  Musculoskeletal: She exhibits edema.  Neurological: She is alert and oriented to person, place, and time.    Assessment/Plan: Problem #1 fluid overload: This is  secondary to uncontrolled salt and fluid intake and missing her dialysis. Last dialysis was on April 28. Problem #2 end-stage renal disease: Presently still denies any nausea or vomiting. Potassium is normal and BUN and creatinine is high. Problem 3) anemia: Her hemoglobin is below target goal. Most likely from missing regular Epogen.  Problem #4. Metabolic bone disease Problem #5 history of liver cirrhosis Problem #6 hypotension: Mostly intradialytic. Patient on Midodrine but patient doesn't take it on a regular basis. Plan: 1]We'll make arrangements for patient to get dialysis today for 31/2 hours and also we'll dialyze her in the morning. 2] we'll try to remove 3 L if her blood pressure tolerates. 3] patient advised to cut down her salt and fluid intake and also come to dialysis on a regular basis. 4] we'll check renal panel and CBC in the morning 5] we'll hold heparin and will use Epogen 10,000 units IV after each dialysis.  Wanda [name] S 12/13/2016, 9:15 AM

## 2016-12-13 NOTE — ED Provider Notes (Signed)
[facility] DEPT Provider Note   CSN: [id number] Arrival date & time: 12/13/16  4970     History   Chief Complaint Chief Complaint  Patient presents with  . Leg Pain    HPI Wanda [name] is a 41 y.o. female.  Patient presents with ongoing right leg pain since April 30 that she thought was due to muscle strain. She was seen in the ED and missed dialysis that day and has not had dialysis since April 28. She called EMS this morning because of ongoing leg pain and feeling that she needs dialysis. She endorses some shortness of breath and diffuse body aches. Patient with history of end-stage renal disease, make some urine. She was found to have GI and sent home with Keflex which her culture is resistant to when she was unable to be contacted by phone per records. She denies fever. She denies vomiting. She denies chest pain. She has diffuse abdominal distention due to ascites. He endorses worsening leg swelling, abdominal swelling and back pain. Her legs have been weeping. She denies any falls or injuries.   The history is provided by the patient and the EMS personnel.  Leg Pain      Past Medical History:  Diagnosis Date  . Acute blood loss anemia 02/25/2014   Status post transfusion  . Acute renal failure ([place]) 09/2013   Pre-renal- resolved  . Anasarca 10/10/2013  . Anxiety   . Bipolar disorder ([place]) 12/04/2013   2007-SEEN IN ED FOR INVOLUNTARY COMMITMENT, UDS POS FOR AMPHETAMINES/OPIATES   . Bleeding esophageal varices ([place]) 02/28/2014   s/p banding  . C. difficile colitis 04/19/2014  . Chronic hypotension   . Cirrhosis ([place]) 10/05/13   Liver bx 11/23/13 (delayed initially due to patient refusal). c/w steatohepatitis  . Cirrhosis of liver with ascites ([place])   . Depression   . ESRD (end stage renal disease) on dialysis ([place]) 08/2014  . Folate deficiency 09/2013  . Gastroesophageal junction ulcer 09/17/2014  . GERD (gastroesophageal reflux disease)   . Hematemesis/vomiting blood  02/24/2014  . Macrocytosis 02/28/2014  . PNA (pneumonia) 10/13/2013  . SBP (spontaneous bacterial peritonitis) ([place]) 11/10/2013  . Thrombocytopenia ([place])    Hypercellular bone marrow; abundant megakaryocytes per 08/27/2014; s/p bone marrow bx    Patient Active Problem List   Diagnosis Date Noted  . History of noncompliance with medical treatment   . Liver cirrhosis secondary to NASH (nonalcoholic steatohepatitis) ([place])   . Palliative care encounter   . Pulmonary edema 11/20/2016  . Hypocalcemia 11/20/2016  . Cardiomyopathy- ? ischemic  10/09/2016  . Troponin level elevated 10/09/2016  . Goals of care, counseling/discussion   . Palliative care by specialist   . Seizure ([place]) 10/07/2016  . Pressure injury of skin 05/20/2016  . Shock ([place]) 05/19/2016  . Rhonchi   . Chronic pain syndrome 08/22/2015  . Abdominal pain 08/07/2015  . Umbilical hernia without obstruction and without gangrene 08/07/2015  . GI bleed   . Acute blood loss anemia   . Hypotension 06/05/2015  . Edema of left lower extremity 02/22/2015  . Ascites of liver   . History of esophageal varices with bleeding 09/16/2014  . End stage renal disease on dialysis ([place])   . Anemia of chronic disease   . Thrombocytopenia ([place]) 08/19/2014  . Hyponatremia 08/17/2014  . Esophageal varices without bleeding ([place])   . Portal hypertensive gastropathy ([place])   . Malnutrition of moderate degree ([place]) 05/15/2014  . Bipolar disorder ([place]) 12/04/2013  . SBP (  spontaneous bacterial peritonitis) ([place]) 11/10/2013  . Liver cirrhosis secondary to nonalcoholic steatohepatitis (NASH) ([place]) 11/07/2013  . Folate deficiency 10/13/2013  . Anasarca 10/10/2013  . Anemia 10/06/2013    Past Surgical History:  Procedure Laterality Date  . AV FISTULA PLACEMENT Right 11/16/2014   Procedure: Right arm Creation of arteriovenous fistula;  Surgeon: Angelia Mould, MD;  Location: [facility];  Service: Vascular;  Laterality: Right;  . CENTRAL VENOUS  CATHETER INSERTION Right   . COLONOSCOPY N/A 12/19/2013   SLF:NO OBVIOUS SOURCE FOR ANEMIA IDETIFIED/ONE COLON POLYP REMOVED/Small internal hemorrhoids  . ESOPHAGEAL BANDING  07/04/2014   Procedure: ESOPHAGEAL BANDING;  Surgeon: Daneil Dolin, MD;  Location: [facility];  Service: Endoscopy;;  . ESOPHAGEAL BANDING N/A 07/24/2014   Procedure: ESOPHAGEAL BANDING (2 bands applied);  Surgeon: Danie Binder, MD;  Location: [facility];  Service: Endoscopy;  Laterality: N/A;  . ESOPHAGEAL BANDING N/A 07/23/2015   Procedure: ESOPHAGEAL BANDING;  Surgeon: Danie Binder, MD;  Location: [facility];  Service: Endoscopy;  Laterality: N/A;  . ESOPHAGEAL BANDING N/A 03/04/2016   Procedure: ESOPHAGEAL BANDING;  Surgeon: Danie Binder, MD;  Location: [facility];  Service: Endoscopy;  Laterality: N/A;  . ESOPHAGEAL BANDING N/A 04/30/2016   Procedure: ESOPHAGEAL BANDING;  Surgeon: Daneil Dolin, MD;  [facility];  Service: Endoscopy;  Laterality: N/A;  . ESOPHAGOGASTRODUODENOSCOPY N/A 11/14/2013   SLF:1 column of very small varices in distal esopahgus/MODERATE PORTAL GASTROPATHY IN PROXIMAL STOMACH/MODERATE erosive gastritis  . ESOPHAGOGASTRODUODENOSCOPY N/A 02/11/2014   Dr. Rourk:Esophageal varices with bleeding stigmata-status post esophageal band ligation therapy. Portal gastropathy  . ESOPHAGOGASTRODUODENOSCOPY N/A 07/04/2014   RMR: Persiting grade 2 esophageal varicies with bleeding stigmata status post band ligation. Significantly congested gastric mucosa iwith changes constistant with protal gastropathy.   . ESOPHAGOGASTRODUODENOSCOPY N/A 09/16/2014   Rehman: Single short column of varix proximal to GE junction not large enough to be banded. Two amall ulcers at the GEJ felt to be source of GI Bleeding but no active bleeding but no actibe bleeding noted. No therapy rendered. Portal gastropathy NO evidence of peptic ulcer diease or gastric varices.   . ESOPHAGOGASTRODUODENOSCOPY N/A 12/14/2014    SLF: Grade ! esophageal varices. 2. Moderate Portal Gastropathy  . ESOPHAGOGASTRODUODENOSCOPY N/A 03/27/2015   Procedure: ESOPHAGOGASTRODUODENOSCOPY (EGD);  Surgeon: Danie Binder, MD;  [facility];  Service: Endoscopy;  Laterality: N/A;  1045am - moved to 817 @ 11:30  . ESOPHAGOGASTRODUODENOSCOPY N/A 06/05/2015   Procedure: ESOPHAGOGASTRODUODENOSCOPY (EGD);  Surgeon: Clarene Essex, MD;  Location: [facility] ENDOSCOPY;  Service: Endoscopy;  Laterality: N/A;  . ESOPHAGOGASTRODUODENOSCOPY N/A 07/23/2015   SLF:1. Grade 1 esophageal varices 2. Moderate portal hypertensive gastropathy 3. MILd non-erosive gastritis.   [name] ESOPHAGOGASTRODUODENOSCOPY N/A 03/04/2016   Dr. Oneida Alar: grade 1 varices, surveillance in Jan 2017   . ESOPHAGOGASTRODUODENOSCOPY N/A 05/21/2016   Dr. Carlean Purl: non-bleeding esophageal ulcer, likely secondary to MW tear  . ESOPHAGOGASTRODUODENOSCOPY (EGD) WITH PROPOFOL N/A 07/24/2014   SLF:  1. 2 columns grade 2-3 varices- 2 Bands applied.  2.  Moderate gastropathy 3. Duodenal Diverticula  . ESOPHAGOGASTRODUODENOSCOPY (EGD) WITH PROPOFOL N/A 10/26/2014   RMR: 2 columns of grade 2 esophageal varices without obvious bleeding stigmata status post band ligation to complet obliteration of remaining varices.   . ESOPHAGOGASTRODUODENOSCOPY (EGD) WITH PROPOFOL N/A 04/30/2016   Dr. Gala Romney: MW tear, varices obliterated, portal gastropathy  . None    . PARACENTESIS  Feb 2015   1180 fluid, negative  fluid analysis.   [name] PARACENTESIS  10/2013    OB History    Gravida Para Term Preterm AB Living   1 1 1     1    SAB TAB Ectopic Multiple Live Births                   Home Medications    Prior to Admission medications   Medication Sig Start Date End Date Taking? Authorizing Provider  cephALEXin (KEFLEX) 500 MG capsule 1 capsule after dialysis 12/08/16   Davonna Belling, MD  clotrimazole (LOTRIMIN) 1 % cream Apply 1 application topically 3 (three) times daily. 09/30/16   Fields, Marga Melnick, MD    Darbepoetin Alfa (ARANESP) 60 MCG/0.3ML SOSY injection Inject 0.3 mLs (60 mcg total) into the vein every Thursday with hemodialysis. Patient taking differently: Inject 60 mcg into the vein every Tuesday with hemodialysis.  06/09/15   Cherene Altes, MD  lactulose (CHRONULAC) 10 GM/15ML solution Take 45 mLs (30 g total) by mouth 3 (three) times daily. TAKE 15-30 ml BY MOUTH three times a day Patient taking differently: Take 10-20 g by mouth 3 (three) times daily. TAKE 15-30 ml BY MOUTH three times a day 09/27/16   Cherene Altes, MD  lidocaine (XYLOCAINE) 2 % solution TAKE TWO TEASPOONSFUL (10ML) BY MOUTH BEFORE MEALS AND AT BEDTIME TO PREVENT CHEST PAIN WHILE EATING 03/19/16   Carlis Stable, NP  lidocaine-prilocaine (EMLA) cream Apply 1 application topically as needed (for dialysis treatments).    [provider]  LORazepam (ATIVAN) 1 MG tablet Take 1 mg by mouth 2 (two) times daily as needed for anxiety (and/or back spasms).    [provider]  midodrine (PROAMATINE) 10 MG tablet Take 1 tablet (10 mg total) by mouth every morning. Patient taking differently: Take 10 mg by mouth See admin instructions. Take ONLY on dialysis days 09/28/16   Cherene Altes, MD  omeprazole (PRILOSEC) 20 MG capsule 1 po every morning 30 minutes prior to your first meal. Patient taking differently: Take 20 mg by mouth daily before breakfast. 1 po every morning 30 minutes prior to your first meal. 09/30/16   Fields, Marga Melnick, MD  Oxycodone HCl 10 MG TABS Take 0.5 tablets (5 mg total) by mouth 3 (three) times daily as needed. 1 PO TID AS NEEDED FOR PAIN Patient taking differently: Take 5-10 mg by mouth 3 (three) times daily as needed.  09/27/16   Cherene Altes, MD  RENVELA 800 MG tablet Take 1600 mg by mouth 3 times daily with meals. Take 800 mg by mouth with snacks. 10/29/15   [provider]  sucralfate (CARAFATE) 1 GM/10ML suspension Take 1 g by mouth 4 (four) times daily -  with meals  and at bedtime.    [provider]    Family History Family History  Problem Relation Age of Onset  . Heart disease Mother   . Colon cancer Neg Hx   . Liver disease Neg Hx     Social History Social History  Substance Use Topics  . Smoking status: Former Smoker    Packs/day: 0.25    Years: 20.00    Types: Cigarettes    Quit date: 11/05/2008  . Smokeless tobacco: Never Used     Comment: Never really smoked much  . Alcohol use No     Allergies   Iohexol; Lasix [furosemide]; and Latex   Review of Systems Review of Systems  Constitutional: Positive for activity change, appetite  change and fatigue. Negative for fever.  HENT: Negative for congestion.   Eyes: Negative for visual disturbance.  Respiratory: Positive for shortness of breath. Negative for cough and chest tightness.   Cardiovascular: Positive for leg swelling. Negative for chest pain.  Gastrointestinal: Positive for abdominal distention and abdominal pain. Negative for nausea and vomiting.  Endocrine: Negative for polyuria.  Genitourinary: Negative for dysuria, hematuria, vaginal bleeding and vaginal discharge.  Musculoskeletal: Positive for arthralgias and myalgias.  Skin: Negative for wound.  Neurological: Positive for weakness. Negative for dizziness and headaches.    all other systems are negative except as noted in the HPI and PMH.    Physical Exam Updated Vital Signs BP (!) 126/55 (BP Location: Left Arm)   Pulse (!) 107   Temp 98 F (36.7 C) (Oral)   Resp 16   Ht 5\' 1"  (1.549 m)   Wt 173 lb (78.5 kg)   LMP 09/21/2013   SpO2 99%   BMI 32.69 kg/m   Physical Exam  Constitutional: She is oriented to person, place, and time. She appears well-developed and well-nourished. No distress.  Chronically ill appearing  HENT:  Head: Normocephalic and atraumatic.  Mouth/Throat: Oropharynx is clear and moist. No oropharyngeal exudate.  Eyes: Conjunctivae and EOM are normal. Pupils are equal, round,  and reactive to light.  Neck: Normal range of motion. Neck supple.  No meningismus.  Cardiovascular: Normal rate, regular rhythm, normal heart sounds and intact distal pulses.   No murmur heard. Pulmonary/Chest: Effort normal and breath sounds normal. No respiratory distress. She exhibits no tenderness.  Abdominal: Soft. She exhibits distension. There is no tenderness. There is no rebound and no guarding.  Large ascites, reducible umbilical hernia  Musculoskeletal: Normal range of motion. She exhibits edema. She exhibits no tenderness.  Pulse 3 brawny edema to thighs bilaterally. Intact DP pulses. Some clear weeping of lower extremities. Pain with range of motion of right thigh.  Dialysis fistula RUE with thrill  Neurological: She is alert and oriented to person, place, and time. No cranial nerve deficit. She exhibits normal muscle tone. Coordination normal.  No ataxia on finger to nose bilaterally. No pronator drift. 5/5 strength throughout. CN 2-12 intact.Equal grip strength. Sensation intact.   Skin: Skin is warm.  Psychiatric: She has a normal mood and affect. Her behavior is normal.  Nursing note and vitals reviewed.    ED Treatments / Results  Labs (all labs ordered are listed, but only abnormal results are displayed) Labs Reviewed  CBC WITH DIFFERENTIAL/PLATELET - Abnormal; Notable for the following:       Result Value   RBC 2.66 (*)    Hemoglobin 8.2 (*)    HCT 25.2 (*)    RDW 17.3 (*)    All other components within normal limits  BASIC METABOLIC PANEL - Abnormal; Notable for the following:    Chloride 95 (*)    CO2 19 (*)    Glucose, Bld 101 (*)    BUN 80 (*)    Creatinine, Ser 12.58 (*)    Calcium 8.5 (*)    GFR calc non Af Amer 3 (*)    GFR calc Af Amer 4 (*)    Anion gap 21 (*)    All other components within normal limits  AMMONIA - Abnormal; Notable for the following:    Ammonia 54 (*)    All other components within normal limits  HEPATIC FUNCTION PANEL -  Abnormal; Notable for the following:    Total Protein  6.4 (*)    Albumin 2.8 (*)    Alkaline Phosphatase 149 (*)    All other components within normal limits  LACTIC ACID, PLASMA - Abnormal; Notable for the following:    Lactic Acid, Venous 2.7 (*)    All other components within normal limits  CULTURE, BLOOD (ROUTINE X 2)  CULTURE, BLOOD (ROUTINE X 2)  CK  URINALYSIS, ROUTINE W REFLEX MICROSCOPIC    EKG  EKG Interpretation  Date/Time:  Sunday Dec 13 2016 04:26:51 EDT Ventricular Rate:  106 PR Interval:    QRS Duration: 94 QT Interval:  335 QTC Calculation: 445 R Axis:   82 Text Interpretation:  Sinus tachycardia Right atrial enlargement No significant change was found Confirmed by Wyvonnia Dusky  MD, Nadina Fomby [phone]) on 12/13/2016 5:12:42 AM       Radiology Dg Chest 2 View  Result Date: 12/13/2016 CLINICAL DATA:  Leg and abdominal swelling, no dialysis since April 28th. History of cirrhosis. EXAM: CHEST  2 VIEW COMPARISON:  Chest radiograph Dec 08, 2016 FINDINGS: Cardiac silhouette is mildly enlarged. Mediastinal silhouette is nonsuspicious. Calcified aortic knob. Similar pulmonary vascular congestion and chronic bronchitic changes without pleural effusion or focal consolidation Included abdomen demonstrates distention with bowel air-fluid levels at a similar level. IMPRESSION: Stable cardiomegaly and pulmonary vascular congestion. Mild bronchitic changes without focal consolidation. Suspected ascites. Electronically Signed   By: Elon Alas M.D.   On: 12/13/2016 05:29   Dg Hip Unilat W Or Wo Pelvis 2-3 Views Right  Result Date: 12/13/2016 CLINICAL DATA:  Leg and abdominal swelling, no dialysis since April 28th. History of cirrhosis. EXAM: DG HIP (WITH OR WITHOUT PELVIS) 2-3V RIGHT COMPARISON:  None. FINDINGS: There is no evidence of hip fracture or dislocation. There is no evidence of arthropathy or other focal bone abnormality. Moderate vascular calcifications. Large body habitus.  IMPRESSION: No acute osseous process. Electronically Signed   By: Elon Alas M.D.   On: 12/13/2016 05:30    Procedures Procedures (including critical care time)  Medications Ordered in ED Medications - No data to display   Initial Impression / Assessment and Plan / ED Course  I have reviewed the triage vital signs and the nursing notes.  Pertinent labs & imaging results that were available during my care of the patient were reviewed by me and considered in my medical decision making (see chart for details).    Dialysis patient who has not had dialysis for the past 8 days. Seen in the ED on May 1 with right groin pain which persists. Found to have UTI at that time. Culture grew Citrobacter that is resistant to prescribed Keflex. Patient denies fever or vomiting.  EKG shows sinus tachycardia without hyperkalemic changes. Patient does have anasarca and mild pulmonary edema but no hypoxia or increased work of breathing. Potassium is normal.  She is afebrile. Lactate is however elevated.   no leukocytosis.  Plan admission for volume overload and anasarca. Patient with no increased work of breathing or hypoxia at this time. She's been noncompliant with outpatient dialysis. She may also benefit from paracentesis. She has no abdominal pain or fever and low suspicion for SBP at this time.  Admission d/w Dr. Darrick Meigs. Final Clinical Impressions(s) / ED Diagnoses   Final diagnoses:  Anasarca  ESRD (end stage renal disease) ([place])  Acute cystitis without hematuria    New Prescriptions New Prescriptions   No medications on file     Ezequiel Essex, MD 12/13/16 [phone]

## 2016-12-13 NOTE — Telephone Encounter (Signed)
12/13/16  Pt is current inpt at [facility]. Discussed UC with Dr, Roderic Palau. Pt currently on Cipro which is sensitive per Micro report

## 2016-12-14 ENCOUNTER — Other Ambulatory Visit: Payer: Self-pay

## 2016-12-14 ENCOUNTER — Inpatient Hospital Stay (HOSPITAL_COMMUNITY): Payer: Medicare Other

## 2016-12-14 LAB — BASIC METABOLIC PANEL
Anion gap: 12 (ref 5–15)
BUN: 46 mg/dL — ABNORMAL HIGH (ref 6–20)
CHLORIDE: 97 mmol/L — AB (ref 101–111)
CO2: 27 mmol/L (ref 22–32)
CREATININE: 7.75 mg/dL — AB (ref 0.44–1.00)
Calcium: 8.3 mg/dL — ABNORMAL LOW (ref 8.9–10.3)
GFR calc non Af Amer: 6 mL/min — ABNORMAL LOW (ref >60)
GFR, EST AFRICAN AMERICAN: 7 mL/min — AB (ref >60)
Glucose, Bld: 108 mg/dL — ABNORMAL HIGH (ref 65–99)
POTASSIUM: 4 mmol/L (ref 3.5–5.1)
SODIUM: 136 mmol/L (ref 135–145)

## 2016-12-14 LAB — CBC
HCT: 22.8 % — ABNORMAL LOW (ref 36.0–46.0)
HEMOGLOBIN: 7.2 g/dL — AB (ref 12.0–15.0)
MCH: 30.6 pg (ref 26.0–34.0)
MCHC: 31.6 g/dL (ref 30.0–36.0)
MCV: 97 fL (ref 78.0–100.0)
PLATELETS: 176 10*3/uL (ref 150–400)
RBC: 2.35 MIL/uL — AB (ref 3.87–5.11)
RDW: 17.5 % — ABNORMAL HIGH (ref 11.5–15.5)
WBC: 5.4 10*3/uL (ref 4.0–10.5)

## 2016-12-14 MED ORDER — SODIUM CHLORIDE 0.9 % IV SOLN: 100.0000 mL | INTRAVENOUS | Status: DC | PRN

## 2016-12-14 MED ORDER — EPOETIN ALFA 20000 UNIT/ML IJ SOLN
12000.0000 [IU] | INTRAMUSCULAR | Status: DC
  Administered 2016-12-14 – 2016-12-21 (×3): 12000 [IU] via SUBCUTANEOUS
  Filled 2016-12-14 (×6): qty 1

## 2016-12-14 MED ORDER — ALTEPLASE 2 MG IJ SOLR
2.0000 mg | Freq: Once | INTRAMUSCULAR | Status: DC | PRN
  Filled 2016-12-14: qty 2

## 2016-12-14 MED ORDER — ALBUMIN HUMAN 25 % IV SOLN
25.0000 g | Freq: Once | INTRAVENOUS | Status: AC
  Administered 2016-12-14: 25 g via INTRAVENOUS
  Filled 2016-12-14: qty 100

## 2016-12-14 MED ORDER — PENTAFLUOROPROP-TETRAFLUOROETH EX AERO
INHALATION_SPRAY | CUTANEOUS | Status: AC
  Administered 2016-12-14: 1 via TOPICAL
  Filled 2016-12-14: qty 103.5

## 2016-12-14 MED ORDER — SEVELAMER CARBONATE 800 MG PO TABS
1600.0000 mg | ORAL_TABLET | ORAL | Status: DC
  Filled 2016-12-14 (×2): qty 2

## 2016-12-14 MED ORDER — HEPARIN SODIUM (PORCINE) 1000 UNIT/ML DIALYSIS
1000.0000 [IU] | INTRAMUSCULAR | Status: DC | PRN
  Filled 2016-12-14: qty 1

## 2016-12-14 MED ORDER — PENTAFLUOROPROP-TETRAFLUOROETH EX AERO
1.0000 "application " | INHALATION_SPRAY | CUTANEOUS | Status: DC | PRN
  Administered 2016-12-14: 1 via TOPICAL

## 2016-12-14 MED ORDER — SEVELAMER CARBONATE 800 MG PO TABS
3200.0000 mg | ORAL_TABLET | Freq: Three times a day (TID) | ORAL | Status: DC
  Filled 2016-12-14 (×8): qty 4

## 2016-12-14 MED ORDER — LIDOCAINE HCL (PF) 1 % IJ SOLN: 5.0000 mL | INTRAMUSCULAR | Status: DC | PRN

## 2016-12-14 NOTE — Progress Notes (Signed)
Paracentesis complete no signs of distress. 5.4L yellow colored ascites removed.

## 2016-12-14 NOTE — Procedures (Signed)
PreOperative Dx: Cirrhosis, ascites Postoperative Dx: Cirrhosis, ascites Procedure:   US guided paracentesis Radiologist:  Onelia Cadmus Anesthesia:  10 ml of1% lidocaine Specimen:  5.4 L of yellow ascitic fluid EBL:   < 1 ml Complications: None  

## 2016-12-14 NOTE — Progress Notes (Signed)
Pt refusing Heparin shot as well as lactulose. Pt stated last BM was this morning; last BM charted was yesterday 12/13/16. RN educated pt on importance of taking lactulose d/t her ammonia level increasing. Pt stated she is aware but did not want to take any lactulose tonight. Pt also educated on importance of taking Heparin d/t increase possibility of blood clot since pt is not getting OOB d/t weakness. Pt stated she was going to take it until she started leaking blood through her gown from her previous heparin shot. Explained to pt that Heparin is a blood thinner and can result in a little bit of bleeding. Pt stated she didn't think the last nurse knew it was a blood thinner and should have placed a bandaid on her belly.  Pt also refusing lotrimin cream and stating she thinks the only reason Lotrimin cream was ordered is because she uses it at home around her belly button. RN adv pt the order it to place the  Lotrimin on affected area three times a day. Pt asking if Dr. Roderic Palau ordered any thing else for pain because she requested something more frequent. RN adv pt there are no new orders for pain medication as well as no increased frequency. Pt requesting that RN page on call physician for Morphine d/t her having 10/10 pain in L shoulder, back, R leg and abdomen. Pt resting quietly in room. RN explained to pt that her BP is in the low 90s and RN didn't feel comfortable paging physician for extra pain medication until her BP came up. Pt stated pain medication didn't decrease her BP. RN educated that the 8L of fluid removed (3L from HD and 5.4 L from paracentesis) is what is decreasing her BP. Will continue to monitor pt.

## 2016-12-14 NOTE — Progress Notes (Signed)
PROGRESS NOTE    Wanda [name]  JOI:[id number] DOB: Feb 01, 1976 DOA: 12/13/2016 PCP: [name], MD    Brief Narrative:  41 year old female with multiple medical problems including cirrhosis and end-stage renal disease who is noncompliant and has had repeated admissions for volume overload. Patient was admitted to hospital with massive volume overload due to noncompliance with dialysis. She is undergoing fluid removal.   Assessment & Plan:   Active Problems:   Anasarca   Bipolar disorder (HCC)   Anemia of chronic disease   Ascites of liver   Umbilical hernia without obstruction and without gangrene   Liver cirrhosis secondary to NASH (nonalcoholic steatohepatitis) (HCC)   ESRD (end stage renal disease) (HCC)   Volume overload   1. Volume overload and anasarca related to noncompliance with dialysis. Patient underwent dialysis yesterday and today with volume removal. She remains grossly volume overloaded and needs further treatments. 2. End-stage renal disease on hemodialysis. Nephrology following. 3. Liver cirrhosis with ascites. Patient underwent paracentesis today with removal of 5.4 L of fluid. 4. Anemia of chronic disease. No evidence of bleeding. Hemoglobin is in baseline range. Recheck tomorrow. If less than 7, will consider transfusion. 5. UTI. Recent urine culture positive for Citrobacter. On ciprofloxacin.    DVT prophylaxis: heparin  Code Status: full code Family Communication: no family present Disposition Plan: discharge home once improved   Consultants:   nephrology  Procedures:   5/7 paracentesis with removal of 5.4L   Antimicrobials:   Ciprofloxacin 5/6>>   Subjective: Continues to have pain in legs bilaterally  Objective: Vitals:   12/14/16 1230 12/14/16 1300 12/14/16 1430 12/14/16 1500  BP: (!) 94/43 (!) 91/43 (!) 102/46 (!) 95/42  Pulse: 81 83 86 86  Resp:  18 20 20   Temp:  99 F (37.2 C)    TempSrc:  Oral    SpO2:   98% 98%    Weight:      Height:        Intake/Output Summary (Last 24 hours) at 12/14/16 1550 Last data filed at 12/14/16 1300  Gross per 24 hour  Intake              200 ml  Output             5500 ml  Net            -5300 ml   Filed Weights   12/13/16 0758 12/13/16 1545 12/14/16 0915  Weight: 77 kg (169 lb 12.1 oz) 77 kg (169 lb 12.1 oz) 77 kg (169 lb 12.1 oz)    Examination:  General exam: Appears calm and comfortable  Respiratory system: Clear to auscultation. Respiratory effort normal. Cardiovascular system: S1 & S2 heard, RRR. No JVD, murmurs, rubs, gallops or clicks. 3+ pedal edema. Gastrointestinal system: Abdomen is distended, soft and nontender. No organomegaly or masses felt. Normal bowel sounds heard. Central nervous system: Alert and oriented. No focal neurological deficits. Extremities: Symmetric 5 x 5 power. Skin: erythema to lower extremities bilaterally Psychiatry: Judgement and insight appear normal. Mood & affect appropriate.     Data Reviewed: I have personally reviewed following labs and imaging studies  CBC:  Recent Labs Lab 12/08/16 1445 12/13/16 0403 12/14/16 0534  WBC 5.9 7.0 5.4  NEUTROABS 4.1 5.1  --   HGB 7.6* 8.2* 7.2*  HCT 23.6* 25.2* 22.8*  MCV 97.9 94.7 97.0  PLT 173 226 470   Basic Metabolic Panel:  Recent Labs Lab 12/08/16 1445 12/13/16 0403 12/14/16 0534  NA 134* 136  135 136  K 3.6 5.0  4.8 4.0  CL 96* 96*  95* 97*  CO2 24 16*  19* 27  GLUCOSE 107* 98  101* 108*  BUN 49* 82*  80* 46*  CREATININE 8.46* 12.81*  12.58* 7.75*  CALCIUM 8.4* 8.2*  8.5* 8.3*  PHOS  --  9.0*  --    GFR: Estimated Creatinine Clearance: 8.9 mL/min (A) (by C-G formula based on SCr of 7.75 mg/dL (H)). Liver Function Tests:  Recent Labs Lab 12/13/16 0403  AST 32  ALT 16  ALKPHOS 149*  BILITOT 1.1  PROT 6.4*  ALBUMIN 2.8*  2.8*   No results for input(s): LIPASE, AMYLASE in the last 168 hours.  Recent Labs Lab 12/13/16 0403  AMMONIA  54*   Coagulation Profile: No results for input(s): INR, PROTIME in the last 168 hours. Cardiac Enzymes:  Recent Labs Lab 12/13/16 0403  CKTOTAL 95   BNP (last 3 results) No results for input(s): PROBNP in the last 8760 hours. HbA1C: No results for input(s): HGBA1C in the last 72 hours. CBG: No results for input(s): GLUCAP in the last 168 hours. Lipid Profile: No results for input(s): CHOL, HDL, LDLCALC, TRIG, CHOLHDL, LDLDIRECT in the last 72 hours. Thyroid Function Tests: No results for input(s): TSH, T4TOTAL, FREET4, T3FREE, THYROIDAB in the last 72 hours. Anemia Panel: No results for input(s): VITAMINB12, FOLATE, FERRITIN, TIBC, IRON, RETICCTPCT in the last 72 hours. Sepsis Labs:  Recent Labs Lab 12/13/16 0430  LATICACIDVEN 2.7*    Recent Results (from the past 240 hour(s))  Urine culture     Status: Abnormal   Collection Time: 12/08/16  4:47 PM  Result Value Ref Range Status   Specimen Description URINE, RANDOM  Final   Special Requests NONE  Final   Culture >=100,000 COLONIES/mL CITROBACTER AMALONATICUS (A)  Final   Report Status 12/11/2016 FINAL  Final   Organism ID, Bacteria CITROBACTER AMALONATICUS (A)  Final      Susceptibility   Citrobacter amalonaticus - MIC*    CEFAZOLIN >=64 RESISTANT Resistant     CEFTRIAXONE >=64 RESISTANT Resistant     CIPROFLOXACIN <=0.25 SENSITIVE Sensitive     GENTAMICIN <=1 SENSITIVE Sensitive     IMIPENEM <=0.25 SENSITIVE Sensitive     NITROFURANTOIN 128 RESISTANT Resistant     TRIMETH/SULFA <=20 SENSITIVE Sensitive     PIP/TAZO 8 SENSITIVE Sensitive     * >=100,000 COLONIES/mL CITROBACTER AMALONATICUS  Blood culture (routine x 2)     Status: None (Preliminary result)   Collection Time: 12/13/16  4:17 AM  Result Value Ref Range Status   Specimen Description LEFT ANTECUBITAL  Final   Special Requests   Final    BOTTLES DRAWN AEROBIC AND ANAEROBIC Blood Culture results may not be optimal due to an excessive volume of blood  received in culture bottles   Culture NO GROWTH 1 DAY  Final   Report Status PENDING  Incomplete  Blood culture (routine x 2)     Status: None (Preliminary result)   Collection Time: 12/13/16  4:26 AM  Result Value Ref Range Status   Specimen Description BLOOD RIGHT HAND  Final   Special Requests   Final    BOTTLES DRAWN AEROBIC ONLY Blood Culture results may not be optimal due to an inadequate volume of blood received in culture bottles   Culture NO GROWTH 1 DAY  Final   Report Status PENDING  Incomplete  Radiology Studies: Dg Chest 2 View  Result Date: 12/13/2016 CLINICAL DATA:  Leg and abdominal swelling, no dialysis since April 28th. History of cirrhosis. EXAM: CHEST  2 VIEW COMPARISON:  Chest radiograph Dec 08, 2016 FINDINGS: Cardiac silhouette is mildly enlarged. Mediastinal silhouette is nonsuspicious. Calcified aortic knob. Similar pulmonary vascular congestion and chronic bronchitic changes without pleural effusion or focal consolidation Included abdomen demonstrates distention with bowel air-fluid levels at a similar level. IMPRESSION: Stable cardiomegaly and pulmonary vascular congestion. Mild bronchitic changes without focal consolidation. Suspected ascites. Electronically Signed   By: Elon Alas M.D.   On: 12/13/2016 05:29   US Paracentesis  Result Date: 12/14/2016 INDICATION: Cirrhosis, ascites EXAM: ULTRASOUND GUIDED THERAPEUTIC PARACENTESIS MEDICATIONS: None. COMPLICATIONS: None immediate. PROCEDURE: Procedure, benefits, and risks of procedure were discussed with patient. Written informed consent for procedure was obtained. Time out protocol followed. Adequate collection of ascites localized by ultrasound in RIGHT lower quadrant. Skin prepped and draped in usual sterile fashion. Skin and soft tissues anesthetized with 10 mL of 1% lidocaine. 5 [place] Yueh catheter placed into peritoneal cavity. 5.4 L of yellow fluid aspirated by vacuum bottle suction. Procedure  tolerated well by patient without immediate complication. FINDINGS: As above IMPRESSION: Successful ultrasound-guided paracentesis yielding 5.4 liters of peritoneal fluid. Electronically Signed   By: Lavonia Dana M.D.   On: 12/14/2016 15:36   Dg Hip Unilat W Or Wo Pelvis 2-3 Views Right  Result Date: 12/13/2016 CLINICAL DATA:  Leg and abdominal swelling, no dialysis since April 28th. History of cirrhosis. EXAM: DG HIP (WITH OR WITHOUT PELVIS) 2-3V RIGHT COMPARISON:  None. FINDINGS: There is no evidence of hip fracture or dislocation. There is no evidence of arthropathy or other focal bone abnormality. Moderate vascular calcifications. Large body habitus. IMPRESSION: No acute osseous process. Electronically Signed   By: Elon Alas M.D.   On: 12/13/2016 05:30        Scheduled Meds: . ciprofloxacin  500 mg Oral Daily  . clotrimazole  1 application Topical TID  . epoetin (EPOGEN/PROCRIT) injection  12,000 Units Subcutaneous Q M,W,F-HD  . heparin  5,000 Units Subcutaneous Q8H  . lactulose  10-20 g Oral TID  . lidocaine  15 mL Mouth/Throat TID AC  . midodrine  10 mg Oral q morning - 10a  . pantoprazole  40 mg Oral Daily  . sevelamer carbonate  1,600 mg Oral With snacks  . sevelamer carbonate  3,200 mg Oral TID WC  . sodium chloride flush  3 mL Intravenous Q12H  . sucralfate  1 g Oral TID WC & HS   Continuous Infusions: . sodium chloride    . sodium chloride    . sodium chloride    . sodium chloride    . sodium chloride    . albumin human       LOS: 1 day    Time spent: 74mins    Roselene Gray, MD [facility] Pager [phone]  If 7PM-7AM, please contact night-coverage [email or web address] Password [email or web address] 12/14/2016, 3:50 PM

## 2016-12-14 NOTE — Progress Notes (Signed)
Subjective: Interval History: has no complaint of nausea or vomiting. Patient however complains of leg swelling and difficulty and pain of her right leg when she is trying to lift it up..  Objective: Vital signs in last 24 hours: Temp:  [97.7 F (36.5 C)-99.2 F (37.3 C)] 99.2 F (37.3 C) (05/07 0415) Pulse Rate:  [80-94] 90 (05/07 0415) Resp:  [18-19] 18 (05/07 0415) BP: (90-119)/(41-56) 105/44 (05/07 0415) SpO2:  [95 %-97 %] 97 % (05/07 0415) Weight:  [77 kg (169 lb 12.1 oz)] 77 kg (169 lb 12.1 oz) (05/06 1545) Weight change: -1.472 kg (-3 lb 3.9 oz)  Intake/Output from previous day: 05/06 0701 - 05/07 0700 In: 200 [P.O.:200] Out: 2500  Intake/Output this shift: No intake/output data recorded.  General appearance: alert, cooperative and no distress Resp: diminished breath sounds bilaterally Cardio: regular rate and rhythm GI: Distends, nontender Extremities: edema 3+ edema  Lab Results:  Recent Labs  12/13/16 0403 12/14/16 0534  WBC 7.0 5.4  HGB 8.2* 7.2*  HCT 25.2* 22.8*  PLT 226 176   BMET:  Recent Labs  12/13/16 0403 12/14/16 0534  NA 136  135 136  K 5.0  4.8 4.0  CL 96*  95* 97*  CO2 16*  19* 27  GLUCOSE 98  101* 108*  BUN 82*  80* 46*  CREATININE 12.81*  12.58* 7.75*  CALCIUM 8.2*  8.5* 8.3*   No results for input(s): PTH in the last 72 hours. Iron Studies: No results for input(s): IRON, TIBC, TRANSFERRIN, FERRITIN in the last 72 hours.  Studies/Results: Dg Chest 2 View  Result Date: 12/13/2016 CLINICAL DATA:  Leg and abdominal swelling, no dialysis since April 28th. History of cirrhosis. EXAM: CHEST  2 VIEW COMPARISON:  Chest radiograph Dec 08, 2016 FINDINGS: Cardiac silhouette is mildly enlarged. Mediastinal silhouette is nonsuspicious. Calcified aortic knob. Similar pulmonary vascular congestion and chronic bronchitic changes without pleural effusion or focal consolidation Included abdomen demonstrates distention with bowel air-fluid levels  at a similar level. IMPRESSION: Stable cardiomegaly and pulmonary vascular congestion. Mild bronchitic changes without focal consolidation. Suspected ascites. Electronically Signed   By: Elon Alas M.D.   On: 12/13/2016 05:29   Dg Hip Unilat W Or Wo Pelvis 2-3 Views Right  Result Date: 12/13/2016 CLINICAL DATA:  Leg and abdominal swelling, no dialysis since April 28th. History of cirrhosis. EXAM: DG HIP (WITH OR WITHOUT PELVIS) 2-3V RIGHT COMPARISON:  None. FINDINGS: There is no evidence of hip fracture or dislocation. There is no evidence of arthropathy or other focal bone abnormality. Moderate vascular calcifications. Large body habitus. IMPRESSION: No acute osseous process. Electronically Signed   By: Elon Alas M.D.   On: 12/13/2016 05:30    I have reviewed the patient's current medications.  Assessment/Plan: 1] fluid overload: This is a combination of uncontrolled salt and fluid intake and also noncompliance with her dialysis. Patient was dialyzed yesterday and we're able to ultrafiltrate about 3 L. Patient still has significant sign of fluid overload. 2] end-stage renal disease: Presently patient doesn't have any nausea or vomiting. Her potassium is normal  3] metabolic and bone disorder: Calcium is range but her phosphorus is very high. Patient presently on a binder 4] anemia: Her hemoglobin is low. Presently she is on Epogen 4] hypotension: Mostly intradialytic 6] liver cirrhosis with ascites Plan: 1] We'll dialyze patient for 3-1/2 hours today 2] will remove about 3 L if her blood pressure tolerates 3] will increase Renvela to 800 mg 4 tablets by mouth  3 times a day with meals and 2 with neck. 4] will increase her Epogen to 12,000 units IV after each dialysis. 5] will check her renal panel and CBC in the morning.   LOS: 1 day   Rivers Hamrick S 12/14/2016,9:14 AM

## 2016-12-14 NOTE — Progress Notes (Signed)
Pt offered Heparin this am for her morning medication. Pt refused once again and stated that she didn't want any Heparin. RN educated pt once again about the risks of not taking her heparin. Pt verbalized understanding.

## 2016-12-15 LAB — RENAL FUNCTION PANEL
ALBUMIN: 2.6 g/dL — AB (ref 3.5–5.0)
Anion gap: 11 (ref 5–15)
BUN: 29 mg/dL — AB (ref 6–20)
CALCIUM: 8.4 mg/dL — AB (ref 8.9–10.3)
CHLORIDE: 98 mmol/L — AB (ref 101–111)
CO2: 27 mmol/L (ref 22–32)
CREATININE: 5.27 mg/dL — AB (ref 0.44–1.00)
GFR, EST AFRICAN AMERICAN: 11 mL/min — AB (ref >60)
GFR, EST NON AFRICAN AMERICAN: 9 mL/min — AB (ref >60)
Glucose, Bld: 104 mg/dL — ABNORMAL HIGH (ref 65–99)
Phosphorus: 3.7 mg/dL (ref 2.5–4.6)
Potassium: 4.4 mmol/L (ref 3.5–5.1)
Sodium: 136 mmol/L (ref 135–145)

## 2016-12-15 LAB — CBC
HCT: 23.4 % — ABNORMAL LOW (ref 36.0–46.0)
Hemoglobin: 7.2 g/dL — ABNORMAL LOW (ref 12.0–15.0)
MCH: 30.8 pg (ref 26.0–34.0)
MCHC: 30.8 g/dL (ref 30.0–36.0)
MCV: 100 fL (ref 78.0–100.0)
PLATELETS: 166 10*3/uL (ref 150–400)
RBC: 2.34 MIL/uL — AB (ref 3.87–5.11)
RDW: 17.9 % — ABNORMAL HIGH (ref 11.5–15.5)
WBC: 6.4 10*3/uL (ref 4.0–10.5)

## 2016-12-15 MED ORDER — CYCLOBENZAPRINE HCL 10 MG PO TABS
10.0000 mg | ORAL_TABLET | Freq: Three times a day (TID) | ORAL | Status: DC | PRN
  Administered 2016-12-15 – 2016-12-21 (×12): 10 mg via ORAL
  Filled 2016-12-15 (×12): qty 1

## 2016-12-15 MED ORDER — OXYCODONE HCL 5 MG PO TABS
5.0000 mg | ORAL_TABLET | ORAL | Status: DC | PRN
  Administered 2016-12-15 – 2016-12-21 (×29): 5 mg via ORAL
  Filled 2016-12-15 (×29): qty 1

## 2016-12-15 MED ORDER — MIDODRINE HCL 5 MG PO TABS
10.0000 mg | ORAL_TABLET | Freq: Three times a day (TID) | ORAL | Status: DC
  Administered 2016-12-15 – 2016-12-21 (×21): 10 mg via ORAL
  Filled 2016-12-15 (×21): qty 2

## 2016-12-15 NOTE — Progress Notes (Signed)
Dr. Shanon Brow paged and made aware of BP 77/23 automatic. Manual BP 84/28.  Pt asking for Ativan, adv pt I was going to talk to doctor again before giving her Ativan. Pt got upset and said, "Well she told you before you could give it to me." RN adv pt that I would like to speak with the doctor again since BP is dropping.  Pt yelled, "Close my door."

## 2016-12-15 NOTE — Telephone Encounter (Signed)
Pt is in the hospital.

## 2016-12-15 NOTE — Progress Notes (Signed)
Late entry from 2030: Pt refusing Heparin stating it makes her bleed. Pt re-educated on importance of DVT prevention. Pt also refusing lactulose once again, pt also re-educated on importance of taking lactulose d/t liver failure and high ammonia levels. Pt stated her last BM was 12/13/16,but it is too painful to get on the bedpan. Oxycodone 5mg  given as well as new order of flexeril 10mg . Will continue to monitor pt

## 2016-12-15 NOTE — Plan of Care (Signed)
Problem: Pain Managment: Goal: General experience of comfort will improve Outcome: Not Progressing Pt continues to complain on generalized pain in shoulder, R leg, and abdomen. Given Oxycodone q6h upon pt request. Pt requesting RN to page on call physician for Morphine order d/t pain. RN educated pt on low BP. Will continue to monitor pt

## 2016-12-15 NOTE — Telephone Encounter (Signed)
REVIEWED-NO ADDITIONAL RECOMMENDATIONS. 

## 2016-12-15 NOTE — Progress Notes (Signed)
Pt crying in room, states she is tired of hurting all the time and tired of no one listening to her. Stated Dr. Roderic Palau told her that he would order her a muscle relaxer and he didn't. Dr. Hilbert Bible paged to ask for muscle relaxer per pt request. Waiting for orders.

## 2016-12-15 NOTE — Progress Notes (Signed)
Dr. Shanon Brow paged and made aware of pts BP 84/32 manually. Pt requesting her Oxycodone at this time. Explained that her pain medication would be held until RN heard from MD.  RN spoke with Dr. Shanon Brow and Dr. Shanon Brow stated that I could give her Oxycodone and Ativan even though blood pressure is low. Will continue to monitor pt

## 2016-12-15 NOTE — Progress Notes (Signed)
Subjective: Interval History: Not feeling good. Still she complains of pain offered leg. She denies any nausea or vomiting.  Objective: Vital signs in last 24 hours: Temp:  [98.9 F (37.2 C)-99.2 F (37.3 C)] 99.1 F (37.3 C) (05/08 0402) Pulse Rate:  [80-90] 80 (05/08 0402) Resp:  [16-20] 16 (05/07 2021) BP: (77-102)/(23-49) 84/28 (05/08 0647) SpO2:  [97 %-98 %] 97 % (05/08 0402) Weight:  [77 kg (169 lb 12.1 oz)] 77 kg (169 lb 12.1 oz) (05/07 0915) Weight change: 0 kg (0 lb)  Intake/Output from previous day: 05/07 0701 - 05/08 0700 In: 800 [P.O.:700; IV Piggyback:100] Out: 3000  Intake/Output this shift: No intake/output data recorded.  General appearance: alert, cooperative and no distress Resp: diminished breath sounds bilaterally Cardio: regular rate and rhythm GI: Distends, nontender Extremities: edema 3+ edema  Lab Results:  Recent Labs  12/14/16 0534 12/15/16 0415  WBC 5.4 6.4  HGB 7.2* 7.2*  HCT 22.8* 23.4*  PLT 176 166   BMET:   Recent Labs  12/14/16 0534 12/15/16 0415  NA 136 136  K 4.0 4.4  CL 97* 98*  CO2 27 27  GLUCOSE 108* 104*  BUN 46* 29*  CREATININE 7.75* 5.27*  CALCIUM 8.3* 8.4*   No results for input(s): PTH in the last 72 hours. Iron Studies: No results for input(s): IRON, TIBC, TRANSFERRIN, FERRITIN in the last 72 hours.  Studies/Results: US Paracentesis  Result Date: 12/14/2016 INDICATION: Cirrhosis, ascites EXAM: ULTRASOUND GUIDED THERAPEUTIC PARACENTESIS MEDICATIONS: None. COMPLICATIONS: None immediate. PROCEDURE: Procedure, benefits, and risks of procedure were discussed with patient. Written informed consent for procedure was obtained. Time out protocol followed. Adequate collection of ascites localized by ultrasound in RIGHT lower quadrant. Skin prepped and draped in usual sterile fashion. Skin and soft tissues anesthetized with 10 mL of 1% lidocaine. 5 [place] Yueh catheter placed into peritoneal cavity. 5.4 L of yellow fluid  aspirated by vacuum bottle suction. Procedure tolerated well by patient without immediate complication. FINDINGS: As above IMPRESSION: Successful ultrasound-guided paracentesis yielding 5.4 liters of peritoneal fluid. Electronically Signed   By: Lavonia Dana M.D.   On: 12/14/2016 15:36    I have reviewed the patient's current medications.  Assessment/Plan: 1] fluid overload: This is a combination of uncontrolled salt and fluid intake and also noncompliance with her dialysis. She is feeling better however still she has significant fluid overload. At this moment seems to be improving. 2] end-stage renal disease: Presently patient doesn't have any nausea or vomiting. Her potassium is normal  3] metabolic and bone disorder: Her calcium and phosphorus is range. Presently she is on a binder. 4] anemia: Her hemoglobin is low. Presently she is on Epogen. Her hemoglobin is stable. 4] hypotension: Recurrent problem. Patient on Midodrine 10 mg once a day. 6] liver cirrhosis with ascites Plan: 1] We'll dialyze patient for 4 hours tomorrow 2] will remove about 31/2 L if her blood pressure tolerates 3] will decrease Renvela to 800 mg 1 tablet by mouth 3 times a day with each meal 4] will increase midodrine to 10 mg by mouth 3 times a day 5] will check her renal panel and CBC in the morning.   LOS: 2 days   Erdine Hulen S 12/15/2016,7:27 AM

## 2016-12-15 NOTE — Progress Notes (Signed)
Pt educated again on importance of heparin to prevent blood clots. Pt verbalized understanding and continued to refuse.

## 2016-12-15 NOTE — Progress Notes (Signed)
PROGRESS NOTE    Wanda [name]  TIR:[id number] DOB: Feb 24, 1976 DOA: 12/13/2016 PCP: [name], MD    Brief Narrative:  41 year old female with multiple medical problems including cirrhosis and end-stage renal disease who is noncompliant and has had repeated admissions for volume overload. Patient was admitted to hospital with massive volume overload due to noncompliance with dialysis. She is undergoing fluid removal.   Assessment & Plan:   Active Problems:   Anasarca   Bipolar disorder (HCC)   Anemia of chronic disease   Ascites of liver   Umbilical hernia without obstruction and without gangrene   Liver cirrhosis secondary to NASH (nonalcoholic steatohepatitis) (HCC)   ESRD (end stage renal disease) (HCC)   Volume overload   1. Volume overload and anasarca related to noncompliance with dialysis. Patient underwent dialysis on 5/6 and 5/7. She remains grossly volume overloaded and needs further treatments. 2. End-stage renal disease on hemodialysis. Nephrology following. 3. Liver cirrhosis with ascites. Patient underwent paracentesis on 5/7 with removal of 5.4 L of fluid. Continue on lactulose. 4. Anemia of chronic disease. No evidence of bleeding. Hemoglobin is in baseline range. Recheck tomorrow. If less than 7, will consider transfusion. 5. UTI. Recent urine culture positive for Citrobacter. On ciprofloxacin. 6. Chronic hypotension. Likely related to autonomic dysfunction. Continue on midodrine    DVT prophylaxis: heparin  Code Status: full code Family Communication: no family present Disposition Plan: discharge home once improved   Consultants:   nephrology  Procedures:   5/7 paracentesis with removal of 5.4L   Antimicrobials:   Ciprofloxacin 5/6>>   Subjective: Does not feel well. Reports that trying to lift her right leg up causes pain.  Objective: Vitals:   12/15/16 0408 12/15/16 0642 12/15/16 0647 12/15/16 1439  BP: (!) 84/32 (!) 77/23 (!)  84/28 (!) 92/39  Pulse:    82  Resp:    18  Temp:    98.7 F (37.1 C)  TempSrc:    Oral  SpO2:    94%  Weight:      Height:        Intake/Output Summary (Last 24 hours) at 12/15/16 1742 Last data filed at 12/15/16 1600  Gross per 24 hour  Intake             1663 ml  Output                0 ml  Net             1663 ml   Filed Weights   12/13/16 0758 12/13/16 1545 12/14/16 0915  Weight: 77 kg (169 lb 12.1 oz) 77 kg (169 lb 12.1 oz) 77 kg (169 lb 12.1 oz)    Examination:  General exam: Appears calm and comfortable  Respiratory system: Clear to auscultation. Respiratory effort normal. Cardiovascular system: S1 & S2 heard, RRR. No JVD, murmurs, rubs, gallops or clicks. 2+ pedal edema. Gastrointestinal system: Abdomen is distended, soft and nontender. No organomegaly or masses felt. Normal bowel sounds heard. Central nervous system: Alert and oriented. No focal neurological deficits. Extremities: Symmetric 5 x 5 power. Skin: erythema to lower extremities bilaterally Psychiatry: Judgement and insight appear normal. Mood & affect appropriate.     Data Reviewed: I have personally reviewed following labs and imaging studies  CBC:  Recent Labs Lab 12/13/16 0403 12/14/16 0534 12/15/16 0415  WBC 7.0 5.4 6.4  NEUTROABS 5.1  --   --   HGB 8.2* 7.2* 7.2*  HCT 25.2* 22.8* 23.4*  MCV 94.7 97.0 100.0  PLT 226 176 527   Basic Metabolic Panel:  Recent Labs Lab 12/13/16 0403 12/14/16 0534 12/15/16 0415  NA 136  135 136 136  K 5.0  4.8 4.0 4.4  CL 96*  95* 97* 98*  CO2 16*  19* 27 27  GLUCOSE 98  101* 108* 104*  BUN 82*  80* 46* 29*  CREATININE 12.81*  12.58* 7.75* 5.27*  CALCIUM 8.2*  8.5* 8.3* 8.4*  PHOS 9.0*  --  3.7   GFR: Estimated Creatinine Clearance: 13.1 mL/min (A) (by C-G formula based on SCr of 5.27 mg/dL (H)). Liver Function Tests:  Recent Labs Lab 12/13/16 0403 12/15/16 0415  AST 32  --   ALT 16  --   ALKPHOS 149*  --   BILITOT 1.1  --     PROT 6.4*  --   ALBUMIN 2.8*  2.8* 2.6*   No results for input(s): LIPASE, AMYLASE in the last 168 hours.  Recent Labs Lab 12/13/16 0403  AMMONIA 54*   Coagulation Profile: No results for input(s): INR, PROTIME in the last 168 hours. Cardiac Enzymes:  Recent Labs Lab 12/13/16 0403  CKTOTAL 95   BNP (last 3 results) No results for input(s): PROBNP in the last 8760 hours. HbA1C: No results for input(s): HGBA1C in the last 72 hours. CBG: No results for input(s): GLUCAP in the last 168 hours. Lipid Profile: No results for input(s): CHOL, HDL, LDLCALC, TRIG, CHOLHDL, LDLDIRECT in the last 72 hours. Thyroid Function Tests: No results for input(s): TSH, T4TOTAL, FREET4, T3FREE, THYROIDAB in the last 72 hours. Anemia Panel: No results for input(s): VITAMINB12, FOLATE, FERRITIN, TIBC, IRON, RETICCTPCT in the last 72 hours. Sepsis Labs:  Recent Labs Lab 12/13/16 0430  LATICACIDVEN 2.7*    Recent Results (from the past 240 hour(s))  Urine culture     Status: Abnormal   Collection Time: 12/08/16  4:47 PM  Result Value Ref Range Status   Specimen Description URINE, RANDOM  Final   Special Requests NONE  Final   Culture >=100,000 COLONIES/mL CITROBACTER AMALONATICUS (A)  Final   Report Status 12/11/2016 FINAL  Final   Organism ID, Bacteria CITROBACTER AMALONATICUS (A)  Final      Susceptibility   Citrobacter amalonaticus - MIC*    CEFAZOLIN >=64 RESISTANT Resistant     CEFTRIAXONE >=64 RESISTANT Resistant     CIPROFLOXACIN <=0.25 SENSITIVE Sensitive     GENTAMICIN <=1 SENSITIVE Sensitive     IMIPENEM <=0.25 SENSITIVE Sensitive     NITROFURANTOIN 128 RESISTANT Resistant     TRIMETH/SULFA <=20 SENSITIVE Sensitive     PIP/TAZO 8 SENSITIVE Sensitive     * >=100,000 COLONIES/mL CITROBACTER AMALONATICUS  Blood culture (routine x 2)     Status: None (Preliminary result)   Collection Time: 12/13/16  4:17 AM  Result Value Ref Range Status   Specimen Description LEFT  ANTECUBITAL  Final   Special Requests   Final    BOTTLES DRAWN AEROBIC AND ANAEROBIC Blood Culture results may not be optimal due to an excessive volume of blood received in culture bottles   Culture NO GROWTH 2 DAYS  Final   Report Status PENDING  Incomplete  Blood culture (routine x 2)     Status: None (Preliminary result)   Collection Time: 12/13/16  4:26 AM  Result Value Ref Range Status   Specimen Description BLOOD RIGHT HAND  Final   Special Requests   Final    BOTTLES DRAWN AEROBIC ONLY  Blood Culture results may not be optimal due to an inadequate volume of blood received in culture bottles   Culture NO GROWTH 2 DAYS  Final   Report Status PENDING  Incomplete         Radiology Studies: US Paracentesis  Result Date: 12/14/2016 INDICATION: Cirrhosis, ascites EXAM: ULTRASOUND GUIDED THERAPEUTIC PARACENTESIS MEDICATIONS: None. COMPLICATIONS: None immediate. PROCEDURE: Procedure, benefits, and risks of procedure were discussed with patient. Written informed consent for procedure was obtained. Time out protocol followed. Adequate collection of ascites localized by ultrasound in RIGHT lower quadrant. Skin prepped and draped in usual sterile fashion. Skin and soft tissues anesthetized with 10 mL of 1% lidocaine. 5 [place] Yueh catheter placed into peritoneal cavity. 5.4 L of yellow fluid aspirated by vacuum bottle suction. Procedure tolerated well by patient without immediate complication. FINDINGS: As above IMPRESSION: Successful ultrasound-guided paracentesis yielding 5.4 liters of peritoneal fluid. Electronically Signed   By: Lavonia Dana M.D.   On: 12/14/2016 15:36        Scheduled Meds: . ciprofloxacin  500 mg Oral Daily  . clotrimazole  1 application Topical TID  . epoetin (EPOGEN/PROCRIT) injection  12,000 Units Subcutaneous Q M,W,F-HD  . heparin  5,000 Units Subcutaneous Q8H  . lactulose  10-20 g Oral TID  . lidocaine  15 mL Mouth/Throat TID AC  . midodrine  10 mg Oral TID WC    . pantoprazole  40 mg Oral Daily  . sevelamer carbonate  1,600 mg Oral With snacks  . sevelamer carbonate  3,200 mg Oral TID WC  . sodium chloride flush  3 mL Intravenous Q12H  . sucralfate  1 g Oral TID WC & HS   Continuous Infusions: . sodium chloride    . sodium chloride    . sodium chloride    . sodium chloride    . sodium chloride       LOS: 2 days    Time spent: 66mins    MEMON,JEHANZEB, MD [facility] Pager [phone]  If 7PM-7AM, please contact night-coverage [email or web address] Password [email or web address] 12/15/2016, 5:42 PM

## 2016-12-16 LAB — RENAL FUNCTION PANEL
Albumin: 2.5 g/dL — ABNORMAL LOW (ref 3.5–5.0)
Anion gap: 8 (ref 5–15)
BUN: 41 mg/dL — AB (ref 6–20)
CHLORIDE: 100 mmol/L — AB (ref 101–111)
CO2: 26 mmol/L (ref 22–32)
Calcium: 8.4 mg/dL — ABNORMAL LOW (ref 8.9–10.3)
Creatinine, Ser: 6.02 mg/dL — ABNORMAL HIGH (ref 0.44–1.00)
GFR, EST AFRICAN AMERICAN: 9 mL/min — AB (ref >60)
GFR, EST NON AFRICAN AMERICAN: 8 mL/min — AB (ref >60)
Glucose, Bld: 82 mg/dL (ref 65–99)
POTASSIUM: 4.2 mmol/L (ref 3.5–5.1)
Phosphorus: 3.7 mg/dL (ref 2.5–4.6)
Sodium: 134 mmol/L — ABNORMAL LOW (ref 135–145)

## 2016-12-16 LAB — CBC
HEMATOCRIT: 23.4 % — AB (ref 36.0–46.0)
Hemoglobin: 7.4 g/dL — ABNORMAL LOW (ref 12.0–15.0)
MCH: 31.1 pg (ref 26.0–34.0)
MCHC: 31.6 g/dL (ref 30.0–36.0)
MCV: 98.3 fL (ref 78.0–100.0)
Platelets: 159 10*3/uL (ref 150–400)
RBC: 2.38 MIL/uL — ABNORMAL LOW (ref 3.87–5.11)
RDW: 17.7 % — AB (ref 11.5–15.5)
WBC: 7.4 10*3/uL (ref 4.0–10.5)

## 2016-12-16 LAB — IRON AND TIBC
IRON: 49 ug/dL (ref 28–170)
Saturation Ratios: 27 % (ref 10.4–31.8)
TIBC: 183 ug/dL — AB (ref 250–450)
UIBC: 134 ug/dL

## 2016-12-16 LAB — FERRITIN: FERRITIN: 140 ng/mL (ref 11–307)

## 2016-12-16 MED ORDER — SODIUM CHLORIDE 0.9 % IV SOLN: 100.0000 mL | INTRAVENOUS | Status: DC | PRN

## 2016-12-16 MED ORDER — HEPARIN SODIUM (PORCINE) 1000 UNIT/ML DIALYSIS
1000.0000 [IU] | INTRAMUSCULAR | Status: DC | PRN
  Filled 2016-12-16: qty 1

## 2016-12-16 MED ORDER — EPOETIN ALFA 10000 UNIT/ML IJ SOLN
INTRAMUSCULAR | Status: AC
  Administered 2016-12-16: 10000 [IU]
  Filled 2016-12-16: qty 1

## 2016-12-16 MED ORDER — EPOETIN ALFA 2000 UNIT/ML IJ SOLN
INTRAMUSCULAR | Status: AC
  Administered 2016-12-16: 2000 [IU]
  Filled 2016-12-16: qty 1

## 2016-12-16 NOTE — Progress Notes (Addendum)
PROGRESS NOTE  Wanda [name] JSE:[id number] DOB: [birth year] DOA: 12/13/2016 PCP: [name], MD  Brief Narrative: 41 year old woman PMH end-stage renal disease, cirrhosis presented to the emergency department with right leg pain. Found to have massive volume overload secondary to noncompliance with hemodialysis, admitted for treatment of massive volume overload.  Assessment/Plan 1. Massive volume overload, anasarca secondary to noncompliance with hemodialysis.  Slowly improving with hemodialysis. Plans for hemodialysis again 5/10.  2. ESRD. Management per nephrology. 3. Anemia of end-stage renal disease. 4. Cirrhosis secondary to steatosis with esophageal varices, ascites, thrombocytopenia. Appears stable.  5. Non-compliance. Refusing lactulose, refusing subcutaneous heparin, refusing other medications including antibiotics. Also noncompliant with outpatient hemodialysis. 6. Chronic hypotension, presumably secondary to autonomic dysfunction. Continue mitodrine.  7. Bipolar disorder 8. Right leg pain. Suspect muscle strain. Imaging recently done in the emergency department was negative for acute bony process. She is able to lift the leg off the bed. She does have some pain with palpation suggesting a muscular etiology. Monitor clinically.   Volume overload decreasing. Continue hemodialysis per nephrology. Ongoing treatment in the outpatient setting likely to continue to be difficult in the setting of noncompliance.   Will suggest palliative care consultation with goal of better understanding patient's treatment goals and any barriers to compliance.  DVT prophylaxis: heparin (patient refusing) Code Status: full Family Communication: none Disposition Plan: home    Murray Hodgkins, MD  [facility] Direct contact: [phone] --Via [facility]  --[email or web address]; password [email or web address]  7PM-7AM contact night coverage as above 12/16/2016, 2:58 PM  LOS: 3 days    Consultants:  Nephrology  Procedures:  5/7: Large volume therapeutic paracentesis, 5.4 L   Antimicrobials:  Ciprofloxacin 5/6 >> 5/9 (patient refused all doses)  Interval history/Subjective: Swelling is slowly decreasing in legs. She continues to have pain "all over". Continues to have some right leg pain. Still has significant abdominal swelling.  Objective: Vitals:   12/16/16 1030 12/16/16 1100 12/16/16 1130 12/16/16 1135  BP: (!) 93/45 (!) 82/40 (!) 90/40 (!) 95/50  Pulse: 78 77 74 81  Resp:    18  Temp:    98.6 F (37 C)  TempSrc:    Oral  SpO2:      Weight:      Height:        Intake/Output Summary (Last 24 hours) at 12/16/16 1458 Last data filed at 12/16/16 1130  Gross per 24 hour  Intake              600 ml  Output             1300 ml  Net             -700 ml     Filed Weights   12/13/16 1545 12/14/16 0915 12/16/16 0845  Weight: 77 kg (169 lb 12.1 oz) 77 kg (169 lb 12.1 oz) 77 kg (169 lb 12.1 oz)    Exam:     Constitutional: Appears chronically ill. Appears calm, comfortable.  Cardiovascular: Regular rate and rhythm. No murmur, rub or gallop. 2+ bilateral lower show any edema.  Respiratory: Clear to auscultation bilaterally. No wheezes, rales or rhonchi. Normal respiratory effort. Speaks in full sentences.  Skin: Brawny edema bilateral lower extremities. Some wrinkling of the skin distal lower extremities bilaterally.  Psychiatric: Flat affect. Speech fluent and appropriate.  Musculoskeletal: Right leg and groin examined with Gae Dry, RN. Significant edema of the thigh and abdomen is noted. The groin appears unremarkable on examination. Skin appears unremarkable.  There is some tenderness over the inguinal crease, however there is no discrete abnormality noted.  I have personally reviewed the following:   Labs:  Potassium normal, BUN, creatinine consistent with known end-stage renal disease. Phosphorus within normal limits. Albumin  2.5.  Hemoglobin stable 7.4. MCV within normal limits.  Imaging studies:  Chest x-ray on admission no acute disease  Scheduled Meds: . ciprofloxacin  500 mg Oral Daily  . clotrimazole  1 application Topical TID  . epoetin (EPOGEN/PROCRIT) injection  12,000 Units Subcutaneous Q M,W,F-HD  . heparin  5,000 Units Subcutaneous Q8H  . lactulose  10-20 g Oral TID  . lidocaine  15 mL Mouth/Throat TID AC  . midodrine  10 mg Oral TID WC  . pantoprazole  40 mg Oral Daily  . sevelamer carbonate  1,600 mg Oral With snacks  . sevelamer carbonate  3,200 mg Oral TID WC  . sodium chloride flush  3 mL Intravenous Q12H  . sucralfate  1 g Oral TID WC & HS   Continuous Infusions: . sodium chloride    . sodium chloride    . sodium chloride    . sodium chloride    . sodium chloride    . sodium chloride    . sodium chloride      Active Problems:   Anasarca   Bipolar disorder (HCC)   Anemia of chronic disease   Ascites of liver   Umbilical hernia without obstruction and without gangrene   Liver cirrhosis secondary to NASH (nonalcoholic steatohepatitis) (HCC)   ESRD (end stage renal disease) (HCC)   Volume overload   LOS: 3 days

## 2016-12-16 NOTE — Progress Notes (Signed)
Pt refused Heparin shot this am. Educated once again on importance for preventing blood clots. Pt verbalized understanding.

## 2016-12-16 NOTE — Progress Notes (Addendum)
Wanda [name]  MRN: [id number]  DOB/AGE: Nov 09, 1975 41 y.o.  Primary Care Physician:Roberson, [name], MD  Admit date: 12/13/2016  Chief Complaint:  Chief Complaint  Patient presents with  . Leg Pain    S-Pt presented on  12/13/2016 with  Chief Complaint  Patient presents with  . Leg Pain  .     Pt offers no specific complaints.    Pt says " I signed off dialysis early as I had back pain"..    I educated pt about need to adhere to full treatment. Pt voiced understanding     Meds . ciprofloxacin  500 mg Oral Daily  . clotrimazole  1 application Topical TID  . epoetin (EPOGEN/PROCRIT) injection  12,000 Units Subcutaneous Q M,W,F-HD  . heparin  5,000 Units Subcutaneous Q8H  . lactulose  10-20 g Oral TID  . lidocaine  15 mL Mouth/Throat TID AC  . midodrine  10 mg Oral TID WC  . pantoprazole  40 mg Oral Daily  . sevelamer carbonate  1,600 mg Oral With snacks  . sevelamer carbonate  3,200 mg Oral TID WC  . sodium chloride flush  3 mL Intravenous Q12H  . sucralfate  1 g Oral TID WC & HS     Physical Exam: Vital signs in last 24 hours: Temp:  [98.6 F (37 C)-98.9 F (37.2 C)] 98.6 F (37 C) (05/09 1135) Pulse Rate:  [72-82] 81 (05/09 1135) Resp:  [16-20] 18 (05/09 1135) BP: (82-100)/(37-50) 95/50 (05/09 1135) SpO2:  [94 %-99 %] 99 % (05/09 0845) Weight:  [169 lb 12.1 oz (77 kg)] 169 lb 12.1 oz (77 kg) (05/09 0845) Weight change:  Last BM Date: 12/13/16  Intake/Output from previous day: 05/08 0701 - 05/09 0700 In: 7353 [P.O.:1320; I.V.:3] Out: -  Total I/O In: -  Out: 1300 [Other:1300]   Physical Exam: General- pt is awake,alert, oriented to time place and person Resp- No acute REsp distress,decreased Bs at bases. CVS- S1S2 regular in rate and rhythm GIT- BS+, soft, distended+  EXT-2+ LE Edema,NO Cyanosis Access Right AVF +   Lab Results: CBC  Recent Labs  12/15/16 0415 12/16/16 0916  WBC 6.4 7.4  HGB 7.2* 7.4*  HCT 23.4* 23.4*  PLT 166 159     BMET  Recent Labs  12/15/16 0415 12/16/16 0916  NA 136 134*  K 4.4 4.2  CL 98* 100*  CO2 27 26  GLUCOSE 104* 82  BUN 29* 41*  CREATININE 5.27* 6.02*  CALCIUM 8.4* 8.4*    MICRO Recent Results (from the past 240 hour(s))  Urine culture     Status: Abnormal   Collection Time: 12/08/16  4:47 PM  Result Value Ref Range Status   Specimen Description URINE, RANDOM  Final   Special Requests NONE  Final   Culture >=100,000 COLONIES/mL CITROBACTER AMALONATICUS (A)  Final   Report Status 12/11/2016 FINAL  Final   Organism ID, Bacteria CITROBACTER AMALONATICUS (A)  Final      Susceptibility   Citrobacter amalonaticus - MIC*    CEFAZOLIN >=64 RESISTANT Resistant     CEFTRIAXONE >=64 RESISTANT Resistant     CIPROFLOXACIN <=0.25 SENSITIVE Sensitive     GENTAMICIN <=1 SENSITIVE Sensitive     IMIPENEM <=0.25 SENSITIVE Sensitive     NITROFURANTOIN 128 RESISTANT Resistant     TRIMETH/SULFA <=20 SENSITIVE Sensitive     PIP/TAZO 8 SENSITIVE Sensitive     * >=100,000 COLONIES/mL CITROBACTER AMALONATICUS  Blood culture (routine x 2)  Status: None (Preliminary result)   Collection Time: 12/13/16  4:17 AM  Result Value Ref Range Status   Specimen Description LEFT ANTECUBITAL  Final   Special Requests   Final    BOTTLES DRAWN AEROBIC AND ANAEROBIC Blood Culture results may not be optimal due to an excessive volume of blood received in culture bottles   Culture NO GROWTH 3 DAYS  Final   Report Status PENDING  Incomplete  Blood culture (routine x 2)     Status: None (Preliminary result)   Collection Time: 12/13/16  4:26 AM  Result Value Ref Range Status   Specimen Description BLOOD RIGHT HAND  Final   Special Requests   Final    BOTTLES DRAWN AEROBIC ONLY Blood Culture results may not be optimal due to an inadequate volume of blood received in culture bottles   Culture NO GROWTH 3 DAYS  Final   Report Status PENDING  Incomplete      Lab Results  Component Value Date   CALCIUM  8.4 (L) 12/16/2016   CAION 0.90 (L) 09/22/2016   PHOS 3.7 12/16/2016       Impression: 1)Renal   ESRD on HD                 Pt is on TTS schedule                 Pt had strong hx of non adhrenece to tx                 Pt to have HD today but did not stay for full tx.               2)CVS- Hemodynamically fragile               BP is low                On Midodrine   3)Anemia in ESRD the goal for hgb is 9-11 On epo  4)Liver- Cirrhosis Sec to Steato hepatitis   5)Electrolytes  Normkalemia  Hyponatremia    Sec to ESRD+ Cirrhosis    6)Acid base Co2 at goal  7) Thrombocytopenia-Stable Primary MD following  Plan:  Pt did not stay fr full tx. Will tray again in am.I did educate pt today to please stay for full tx. Pt voiced understanding    [place] S 12/16/2016, 1:47 PM

## 2016-12-17 NOTE — Evaluation (Signed)
Physical Therapy Evaluation Patient Details Name: Wanda [name] MRN: [id number] DOB: [birth year] Today's Date: 12/17/2016   History of Present Illness  41 y.o. female with medical history significant of end-stage renal disease, cirrhosis.[name] She was recently discharged from hospital on 4/19 being treated for pulmonary edema and volume overload. The patient has a long history of noncompliance with dialysis. Since her discharge, patient has not come to the dialysis center. She comes back today with significant right lower extremity pain and overall worsening edema. She denies any fever. She has a chronic cough. No vomiting or diarrhea. She reports being compliant with lactulose. When asked why she has been coming to her dialysis center, patient initially said that she could not come due to pain in her leg, then said that she thought that she was not allowed to come since she had been irregular with dialysis.  Dx: Massive volume overload and anasarca. Related to noncompliance with dialysis..  Clinical Impression  Pt received in bed, Fiance present, and was agreeable to PT evaluation.  Pt normally ambulates household distances with standard walker, and fiance assists with both dressing and bathing.  During PT evaluation, she required supervision for supine<>sit, and Min A for sit<>stand due to initial posterior lean.  She ambulated 31ft with RW and min guard, but limited due to fatigue. Recommend HHPT upon d/c due to pt's continued high risk for falls with 5 hospitalizations in the past 6 months, as well as unsteadiness and limited mobility.    Follow Up Recommendations Home health PT;Supervision/Assistance - 24 hour (Pt has refused HHPT in the past. )    Equipment Recommendations  None recommended by PT    Recommendations for Other Services       Precautions / Restrictions Precautions Precautions: Fall Precaution Comments: Due to immobility Restrictions Weight Bearing Restrictions: No       Mobility  Bed Mobility Overal bed mobility: Needs Assistance Bed Mobility: Supine to Sit     Supine to sit: Supervision;HOB elevated     General bed mobility comments: increased time  Transfers Overall transfer level: Needs assistance Equipment used: Rolling walker (2 wheeled) Transfers: Sit to/from Stand Sit to Stand: Min assist         General transfer comment: due to initial posterior lean.  Poor foot placement for sit <>stand due to edema not allowing optimal knee/hip flexion required for sit<>stand.  PT bracing RW.  Ambulation/Gait Ambulation/Gait assistance: Min guard Ambulation Distance (Feet): 15 Feet Assistive device: Rolling walker (2 wheeled) Gait Pattern/deviations: Step-to pattern;Wide base of support;Trunk flexed     General Gait Details: further distance limited due to fatigue  Stairs            Wheelchair Mobility    Modified Rankin (Stroke Patients Only)       Balance Overall balance assessment: Needs assistance Sitting-balance support: Bilateral upper extremity supported;Feet supported Sitting balance-Leahy Scale: Good Sitting balance - Comments: Slight posterior lean     Standing balance-Leahy Scale: Fair                               Pertinent Vitals/Pain Pain Location: R groin discomfort - pt does not rate Pain Intervention(s): Limited activity within patient's tolerance;Monitored during session;Repositioned    Home Living   Living Arrangements: Spouse/significant other (fiance) Available Help at Discharge: Available 24 hours/day Type of Home: House Home Access: Level entry     Home Layout: One level Home Equipment: Gilford Rile -  standard;Bedside commode      Prior Function Level of Independence: Needs assistance;Independent with assistive device(s)   Gait / Transfers Assistance Needed: Pt states she uses the standard walker to get around.  ADL's / Homemaking Assistance Needed: Fiance assists with dressing and  bathing.         Hand Dominance   Dominant Hand: Right    Extremity/Trunk Assessment   Upper Extremity Assessment Upper Extremity Assessment: Generalized weakness    Lower Extremity Assessment Lower Extremity Assessment: Generalized weakness       Communication   Communication: No difficulties  Cognition Arousal/Alertness: Awake/alert Behavior During Therapy: WFL for tasks assessed/performed Overall Cognitive Status: Within Functional Limits for tasks assessed                                        General Comments      Exercises     Assessment/Plan    PT Assessment Patient needs continued PT services  PT Problem List Decreased strength;Decreased range of motion;Decreased activity tolerance;Decreased balance;Decreased mobility;Decreased safety awareness;Pain       PT Treatment Interventions DME instruction;Gait training;Functional mobility training;Therapeutic activities;Therapeutic exercise;Balance training;Patient/family education;Neuromuscular re-education    PT Goals (Current goals can be found in the Care Plan section)  Acute Rehab PT Goals Patient Stated Goal: Pt wants to go home PT Goal Formulation: With patient/family Time For Goal Achievement: 12/31/16 Potential to Achieve Goals: Fair    Frequency Min 3X/week   Barriers to discharge        Co-evaluation               AM-PAC PT "6 Clicks" Daily Activity  Outcome Measure Difficulty turning over in bed (including adjusting bedclothes, sheets and blankets)?: A Little Difficulty moving from lying on back to sitting on the side of the bed? : A Little Difficulty sitting down on and standing up from a chair with arms (e.g., wheelchair, bedside commode, etc,.)?: A Little Help needed moving to and from a bed to chair (including a wheelchair)?: A Little Help needed walking in hospital room?: A Little Help needed climbing 3-5 steps with a railing? : A Lot 6 Click Score: 17    End  of Session Equipment Utilized During Treatment: Gait belt Activity Tolerance: Patient tolerated treatment well Patient left: in chair;with nursing/sitter in room;with family/visitor present;with call bell/phone within reach Nurse Communication: Mobility status (mobility sheet left up in the room. ) PT Visit Diagnosis: Other abnormalities of gait and mobility (R26.89);Muscle weakness (generalized) (M62.81)    Time: 5916-3846 PT Time Calculation (min) (ACUTE ONLY): 18 min   Charges:   PT Evaluation $PT Eval Low Complexity: 1 Procedure     PT G Codes:   PT G-Codes **NOT FOR INPATIENT CLASS** Functional Assessment Tool Used: AM-PAC 6 Clicks Basic Mobility;Clinical judgement Functional Limitation: Mobility: Walking and moving around Mobility: Walking and Moving Around Current Status (K5993): At least 40 percent but less than 60 percent impaired, limited or restricted Mobility: Walking and Moving Around Goal Status [phone]): At least 20 percent but less than 40 percent impaired, limited or restricted    Beth Michalla Ringer, PT, DPT X: [phone]

## 2016-12-17 NOTE — Progress Notes (Signed)
Subjective: Interval History: Still complains of not feeling good. She has some back pain and leg pain. Denies N difficulty breathing. Objective: Vital signs in last 24 hours: Temp:  [98.4 F (36.9 C)-99 F (37.2 C)] 98.6 F (37 C) (05/10 0500) Pulse Rate:  [72-81] 80 (05/10 0500) Resp:  [16-18] 16 (05/10 0500) BP: (82-96)/(31-50) 90/37 (05/10 0500) SpO2:  [97 %-100 %] 100 % (05/10 0500) Weight change:   Intake/Output from previous day: 05/09 0701 - 05/10 0700 In: 240 [P.O.:240] Out: 1300  Intake/Output this shift: No intake/output data recorded.  General appearance: alert, cooperative and no distress Resp: diminished breath sounds bilaterally Cardio: regular rate and rhythm GI: Distends, nontender Extremities: edema 3+ edema  Lab Results:  Recent Labs  12/15/16 0415 12/16/16 0916  WBC 6.4 7.4  HGB 7.2* 7.4*  HCT 23.4* 23.4*  PLT 166 159   BMET:   Recent Labs  12/15/16 0415 12/16/16 0916  NA 136 134*  K 4.4 4.2  CL 98* 100*  CO2 27 26  GLUCOSE 104* 82  BUN 29* 41*  CREATININE 5.27* 6.02*  CALCIUM 8.4* 8.4*   No results for input(s): PTH in the last 72 hours. Iron Studies:   Recent Labs  12/16/16 0916  IRON 49  TIBC 183*  FERRITIN 140    Studies/Results: No results found.  I have reviewed the patient's current medications.  Assessment/Plan: 1] fluid overload: This is a combination of uncontrolled salt and fluid intake and also noncompliance with her dialysis. Patient had dialysis yesterday and was able to remove 1300 cc. Patient didn't complete her treatment and was not able to remove the whole prescribed amount. 2] end-stage renal disease: Presently patient doesn't have any nausea or vomiting. Her potassium is normal  3] metabolic and bone disorder: Her calcium and phosphorus is range. the dose of her binder has been reduced.  4] anemia: Her hemoglobin is low. . secondary to chronic renal failure. Patient presently on high dose of Epogen. Her  hemoglobin is low but stable  4] hypotension: Recurrent problem. Patient on Midodrine 10 mg 3 times a day. Her blood pressure is better.  6] liver cirrhosis with ascites Plan: 1] We'll dialyze patient for 4 hours tomorrow 2] will remove about 31/2 L if her blood pressure tolerates 3] Patient advised to be compliant with her dialysis.  4] will check her renal panel and CBC in the morning.   LOS: 4 days   Oreta Soloway S 12/17/2016,8:48 AM

## 2016-12-17 NOTE — Progress Notes (Signed)
PROGRESS NOTE  EVOLA HOLLIS VQQ:[id number] DOB: 1976-01-11 DOA: 12/13/2016 PCP: [name], MD  Brief Narrative: 41 year old woman PMH end-stage renal disease, cirrhosis presented to the emergency department with right leg pain. Found to have massive volume overload secondary to noncompliance with hemodialysis, admitted for treatment of massive volume overload.  Assessment/Plan  1. Massive volume overload, anasarca, secondary to noncompliance with hemodialysis. 2. Improving slowly with hemodialysis, plans for hemodialysis again 5/11. 3. End-stage renal disease. Nephrology managing. 4. Anemia of end-stage renal disease. 5. Cirrhosis secondary to steatosis, associated complications esophageal varices, ascites, thrombocytopenia. 6. Noncompliance. Refusing lactulose at times. Refusing subcutaneous heparin and other medications at times including antibiotics. Noncompliant with outpatient hemodialysis. 7. Chronic hypotension, presumed autonomic dysfunction. Asymptomatic. Continue midodrine.  8. Bipolar disorder 9. Right leg pain. Ambulated with physical therapy. No evidence of acute bony process. Continue physical therapy.  Slow to improve, will require significant decrease in edema. We'll discuss palliative care consultation with the patient to assess her treatment goals and appears to compliance if the patient will allow.  DVT prophylaxis: heparin (patient still refusing) Code Status: full Family Communication: none Disposition Plan: home    Murray Hodgkins, MD  [facility] Direct contact: [phone] --Via [place]  --[email or web address]; password [email or web address]  7PM-7AM contact night coverage as above 12/17/2016, 10:00 AM  LOS: 4 days   Consultants:  Nephrology  Procedures:  5/7: Large volume therapeutic paracentesis, 5.4 L   Antimicrobials:  Ciprofloxacin 5/6 >> 5/9 (patient refused all doses)  Interval history/Subjective: Feels okay. Breathing okay. Pain in the  right leg which is described as muscular is about the same. Still has significant edema.  Objective: Vitals:   12/16/16 1135 12/16/16 1607 12/16/16 2153 12/17/16 0500  BP: (!) 95/50 (!) 89/31 (!) 95/37 (!) 90/37  Pulse: 81 77 80 80  Resp: 18 18 16 16   Temp: 98.6 F (37 C) 99 F (37.2 C) 98.4 F (36.9 C) 98.6 F (37 C)  TempSrc: Oral Oral Oral Oral  SpO2:  97% 100% 100%  Weight:      Height:        Intake/Output Summary (Last 24 hours) at 12/17/16 1000 Last data filed at 12/16/16 1730  Gross per 24 hour  Intake              240 ml  Output             1300 ml  Net            -1060 ml     Filed Weights   12/13/16 1545 12/14/16 0915 12/16/16 0845  Weight: 77 kg (169 lb 12.1 oz) 77 kg (169 lb 12.1 oz) 77 kg (169 lb 12.1 oz)    Exam:   Constitutional. Appears calm, comfortable. Sitting in chair.  Respiratory: Clear to auscultation bilaterally. No wheezes, rales or rhonchi. Normal respiratory effort. Speaks in full sentences.  Cardiovascular: Regular rate and rhythm. No murmur, rub or gallop. 4+ bilateral lower extremity edema.  Psychiatric: Flat affect.  I have personally reviewed the following:   Labs:    Imaging studies:    Scheduled Meds: . clotrimazole  1 application Topical TID  . epoetin (EPOGEN/PROCRIT) injection  12,000 Units Subcutaneous Q M,W,F-HD  . heparin  5,000 Units Subcutaneous Q8H  . lactulose  10-20 g Oral TID  . lidocaine  15 mL Mouth/Throat TID AC  . midodrine  10 mg Oral TID WC  . pantoprazole  40 mg Oral Daily  . sevelamer carbonate  1,600 mg Oral With snacks  . sevelamer carbonate  3,200 mg Oral TID WC  . sodium chloride flush  3 mL Intravenous Q12H  . sucralfate  1 g Oral TID WC & HS   Continuous Infusions: . sodium chloride    . sodium chloride    . sodium chloride    . sodium chloride    . sodium chloride    . sodium chloride    . sodium chloride      Principal Problem:   Volume overload Active Problems:   Anasarca    Bipolar disorder (HCC)   Anemia of chronic disease   Ascites of liver   Umbilical hernia without obstruction and without gangrene   Liver cirrhosis secondary to NASH (nonalcoholic steatohepatitis) (HCC)   ESRD (end stage renal disease) ([place])   LOS: 4 days

## 2016-12-18 DIAGNOSIS — Z9119 Patient's noncompliance with other medical treatment and regimen: Secondary | ICD-10-CM

## 2016-12-18 LAB — RENAL FUNCTION PANEL
ANION GAP: 11 (ref 5–15)
Albumin: 2.4 g/dL — ABNORMAL LOW (ref 3.5–5.0)
BUN: 46 mg/dL — ABNORMAL HIGH (ref 6–20)
CALCIUM: 8.9 mg/dL (ref 8.9–10.3)
CO2: 25 mmol/L (ref 22–32)
Chloride: 96 mmol/L — ABNORMAL LOW (ref 101–111)
Creatinine, Ser: 6.9 mg/dL — ABNORMAL HIGH (ref 0.44–1.00)
GFR calc Af Amer: 8 mL/min — ABNORMAL LOW (ref >60)
GFR calc non Af Amer: 7 mL/min — ABNORMAL LOW (ref >60)
GLUCOSE: 94 mg/dL (ref 65–99)
Phosphorus: 4.8 mg/dL — ABNORMAL HIGH (ref 2.5–4.6)
Potassium: 4.1 mmol/L (ref 3.5–5.1)
SODIUM: 132 mmol/L — AB (ref 135–145)

## 2016-12-18 LAB — CULTURE, BLOOD (ROUTINE X 2)
CULTURE: NO GROWTH
CULTURE: NO GROWTH

## 2016-12-18 LAB — CBC
HEMATOCRIT: 24.7 % — AB (ref 36.0–46.0)
HEMOGLOBIN: 7.7 g/dL — AB (ref 12.0–15.0)
MCH: 30.9 pg (ref 26.0–34.0)
MCHC: 31.2 g/dL (ref 30.0–36.0)
MCV: 99.2 fL (ref 78.0–100.0)
Platelets: 154 10*3/uL (ref 150–400)
RBC: 2.49 MIL/uL — AB (ref 3.87–5.11)
RDW: 18.3 % — ABNORMAL HIGH (ref 11.5–15.5)
WBC: 8.1 10*3/uL (ref 4.0–10.5)

## 2016-12-18 MED ORDER — EPOETIN ALFA 20000 UNIT/ML IJ SOLN
INTRAMUSCULAR | Status: AC
  Administered 2016-12-18: 12000 [IU] via SUBCUTANEOUS
  Filled 2016-12-18: qty 1

## 2016-12-18 MED ORDER — FLUTICASONE PROPIONATE 50 MCG/ACT NA SUSP
1.0000 | Freq: Every day | NASAL | Status: DC
  Filled 2016-12-18: qty 16

## 2016-12-18 MED ORDER — ALBUMIN HUMAN 25 % IV SOLN
INTRAVENOUS | Status: AC
  Administered 2016-12-18: 50 g via INTRAVENOUS
  Filled 2016-12-18: qty 200

## 2016-12-18 MED ORDER — ALBUMIN HUMAN 25 % IV SOLN
50.0000 g | Freq: Once | INTRAVENOUS | Status: AC
  Administered 2016-12-18: 50 g via INTRAVENOUS
  Filled 2016-12-18: qty 200

## 2016-12-18 NOTE — Procedures (Signed)
   HEMODIALYSIS TREATMENT NOTE:  3.5 hour heparin-free dialysis completed via right upper arm AVF (15g/antegrade). Goal met: 2.6 liters removed without interruption in ultrafiltration.  SBP 70-80s (asymptomatic) despite administration of Albumin 50g.  All blood was returned and hemostasis was achieved within 15 minutes. Report called to Threasa Alpha, RN.  Rockwell Alexandria, RN, CDN

## 2016-12-18 NOTE — Progress Notes (Signed)
PROGRESS NOTE  Wanda [name] [id number]:[id number] DOB: 01-Aug-1976 DOA: 12/13/2016 PCP: [name], MD  Brief Narrative: 41 year old woman PMH end-stage renal disease, cirrhosis presented to the emergency department with right leg pain. Found to have massive volume overload secondary to noncompliance with hemodialysis, admitted for treatment of massive volume overload.  Assessment/Plan #1: Massive volume overload, anasarca, secondary to noncompliance with hemodialysis and underlying end-stage renal disease, cirrhosis. -Management per nephrology. Plan for hemodialysis today. Still has significant volume overload.  #2: End-stage renal disease. -Hemodialysis per nephrology.  #3: Cirrhosis secondary to steatosis, associated complications including esophageal varices, ascites, thrombocytopenia. -Appears stable. Continue lactulose.  #4: Chronic hypotension, presumed autonomic dysfunction. Remains asymptomatic. -Continue Medrol  #5: Right leg pain. Muscular in nature. X-ray negative for acute bony process. -Physical therapy has recommended home health PT  #6: Anemia of end-stage renal disease. Stable.  #7: Noncompliance. Intermittent refusal of lactulose. Refuses Protonix and DVT prophylaxis. Refused antibiotics. Also noncompliant with outpatient hemodialysis.  -Addressed with patient in open, nonjudgmental fashion, attempted to solicit barriers to care, psychosocial challenges, etc. Patient clearly does not wish to engage in any discussion in regard to these matters. Offered palliative care which I think would help address management of symptoms of her chronic diseases as well as perhaps uncover unreported barriers but she refuses.  -Overall she remains stable. Once volume overload is significantly improved she can go home with home health.  DVT prophylaxis: heparin (patient refusing) Code Status: full Family Communication: none Disposition Plan: home    Murray Hodgkins, MD  [facility] Direct contact: [phone] --[place]ia [place]  --[email or web address]; password [email or web address]  7PM-7AM contact night coverage as above 12/18/2016, 9:46 AM  LOS: 5 days   Consultants:  Nephrology  Procedures:  5/7: Large volume therapeutic paracentesis, 5.4 L   Antimicrobials:  Ciprofloxacin 5/6 >> 5/9 (patient refused all doses)  Interval history/Subjective: Breathing okay. Eating okay. Still has significant lower extremity edema. Still has some right muscle pain in the upper leg.  Objective: Afebrile, temperature 98.2, respirations 18, blood pressure 93/38, pulse 71, SPO2 percent on room air.   Constitutional: Appears calm, comfortable.  Cardiovascular: Regular rate and rhythm. 2/6 holosystolic murmur. No rub or gallop. No change in 4+ bilateral lower extremity edema.  Respiratory: Clear to auscultation bilaterally. No wheezes, rales or rhonchi. Normal respiratory effort.  Abdomen: Soft, distended, mild generalized tenderness  Skin: Brawny edema bilateral lower extremities, chronic skin changes.  Psychiatric: Flat affect. Depressed mood.   I have personally reviewed the following:   Labs:    Imaging studies:    Scheduled Meds: . clotrimazole  1 application Topical TID  . epoetin (EPOGEN/PROCRIT) injection  12,000 Units Subcutaneous Q M,W,F-HD  . heparin  5,000 Units Subcutaneous Q8H  . lactulose  10-20 g Oral TID  . lidocaine  15 mL Mouth/Throat TID AC  . midodrine  10 mg Oral TID WC  . pantoprazole  40 mg Oral Daily  . sevelamer carbonate  1,600 mg Oral With snacks  . sevelamer carbonate  3,200 mg Oral TID WC  . sodium chloride flush  3 mL Intravenous Q12H  . sucralfate  1 g Oral TID WC & HS   Continuous Infusions: . sodium chloride    . sodium chloride    . sodium chloride    . sodium chloride    . sodium chloride    . sodium chloride    . sodium chloride      Principal Problem:   Volume overload Active  Problems:   Anasarca   Bipolar  disorder (HCC)   Anemia of chronic disease   Ascites of liver   Umbilical hernia without obstruction and without gangrene   Liver cirrhosis secondary to NASH (nonalcoholic steatohepatitis) (HCC)   ESRD (end stage renal disease) ([place])   LOS: 5 days

## 2016-12-18 NOTE — Progress Notes (Addendum)
Subjective: Interval History: She is feeling somewhat better. She didn't have any nausea or vomiting. Objective: Vital signs in last 24 hours: Temp:  [98.2 F (36.8 C)-98.7 F (37.1 C)] 98.2 F (36.8 C) (05/11 0354) Pulse Rate:  [71-79] 71 (05/11 0354) Resp:  [15-18] 18 (05/11 0354) BP: (87-93)/(30-38) 93/38 (05/11 0354) SpO2:  [94 %-100 %] 100 % (05/11 0354) Weight change:   Intake/Output from previous day: No intake/output data recorded. Intake/Output this shift: No intake/output data recorded.  General appearance: alert, cooperative and no distress Resp: diminished breath sounds bilaterally Cardio: regular rate and rhythm GI: Distends, nontender Extremities: edema 3+ edema  Lab Results:  Recent Labs  12/16/16 0916  WBC 7.4  HGB 7.4*  HCT 23.4*  PLT 159   BMET:   Recent Labs  12/16/16 0916  NA 134*  K 4.2  CL 100*  CO2 26  GLUCOSE 82  BUN 41*  CREATININE 6.02*  CALCIUM 8.4*   No results for input(s): PTH in the last 72 hours. Iron Studies:   Recent Labs  12/16/16 0916  IRON 49  TIBC 183*  FERRITIN 140    Studies/Results: No results found.  I have reviewed the patient's current medications.  Assessment/Plan: 1] fluid overload:Patient is becoming less symptomatic. Her leg edema also has improved with continuous dialysis. 2] end-stage renal disease: Presently patient doesn't have any nausea or vomiting. Her potassium is normal  3] metabolic and bone disorder: Her calcium and phosphorus is range.Patient is on Renvela 800 mg 1 tablet by mouth 3 times a day with each meal. 4] anemia: Her hemoglobin is low But stable. Presently her iron saturation is normal. Presently she is on Epogen. 4] hypotension: Recurrent problem. Patient on Midodrine 10 mg 3 times a day. Her blood pressure is better.  6] liver cirrhosis with ascites Plan: 1] We'll dialyze patient for 4 hours today. 2] will remove about 31/2 L if her blood pressure tolerates 3] Patient  advised to complete her treatment. 4] we'll continue his Epogen.   LOS: 5 days   Suzanna Zahn S 12/18/2016,9:03 AM

## 2016-12-18 NOTE — Care Management Note (Signed)
Case Management Note  Patient Details  Name: Wanda [name] MRN: [id number] Date of Birth: 09/13/1975  Subjective/Objective:     Patient adm with volume overload/noncompliance with HD. Patient recommended for [facility] PT again this visit, still declines.                Action/Plan:Anticipate DC home with self care, CM will follow up with patient again on Monday is she is still here.    Expected Discharge Date:        12/23/2016          Expected Discharge Plan:  Home/Self Care  In-House Referral:     Discharge planning Services  CM Consult  Post Acute Care Choice:    Choice offered to:     DME Arranged:    DME Agency:     HH Arranged:    HH Agency:     Status of Service:  In process, will continue to follow  If discussed at Long Length of Stay Meetings, dates discussed:    Additional Comments:  [name], Chauncey Reading, RN 12/18/2016, 2:37 PM

## 2016-12-18 NOTE — Progress Notes (Signed)
PT Cancellation Note  Patient Details Name: Wanda [name] MRN: [id number] DOB: 16-Sep-1975   Cancelled Treatment:    Reason Eval/Treat Not Completed: Patient at procedure or test/unavailable (Chart reviewed, treatment attempted: Pt has just recently started her HD treatment. Will attempts again at later date/time as able. )  11:36 AM, 12/18/16 [name], PT, DPT Physical Therapist - [facility])  [phone] (mobile)    [name] C 12/18/2016, 11:36 AM

## 2016-12-19 LAB — HEPATITIS B SURFACE ANTIGEN: HEP B S AG: NEGATIVE

## 2016-12-19 IMAGING — DX DG CHEST 2V
2 series · 2 of 2 positions shown · non-contrast
Comparison: 04/30/2016

CLINICAL DATA: Fatigue and nausea for 2-3 days

EXAM:
CHEST  2 VIEW

[chest ap]
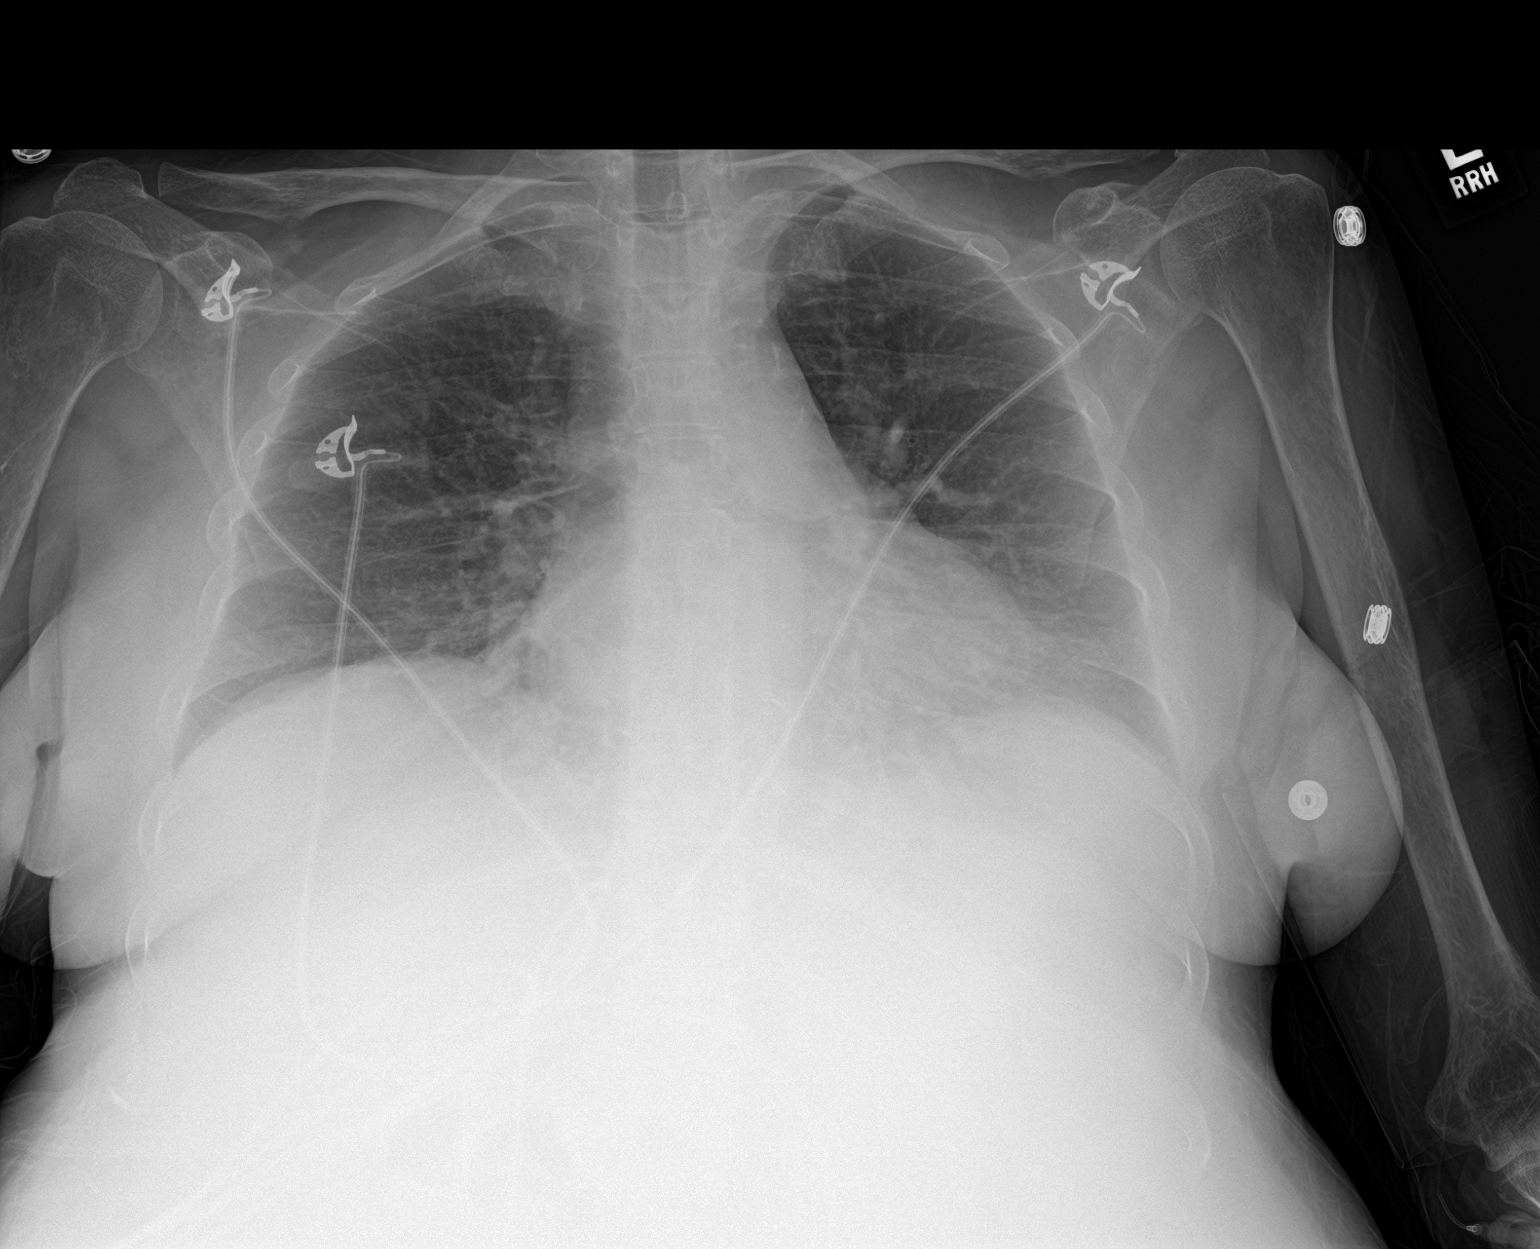

[chest lat]
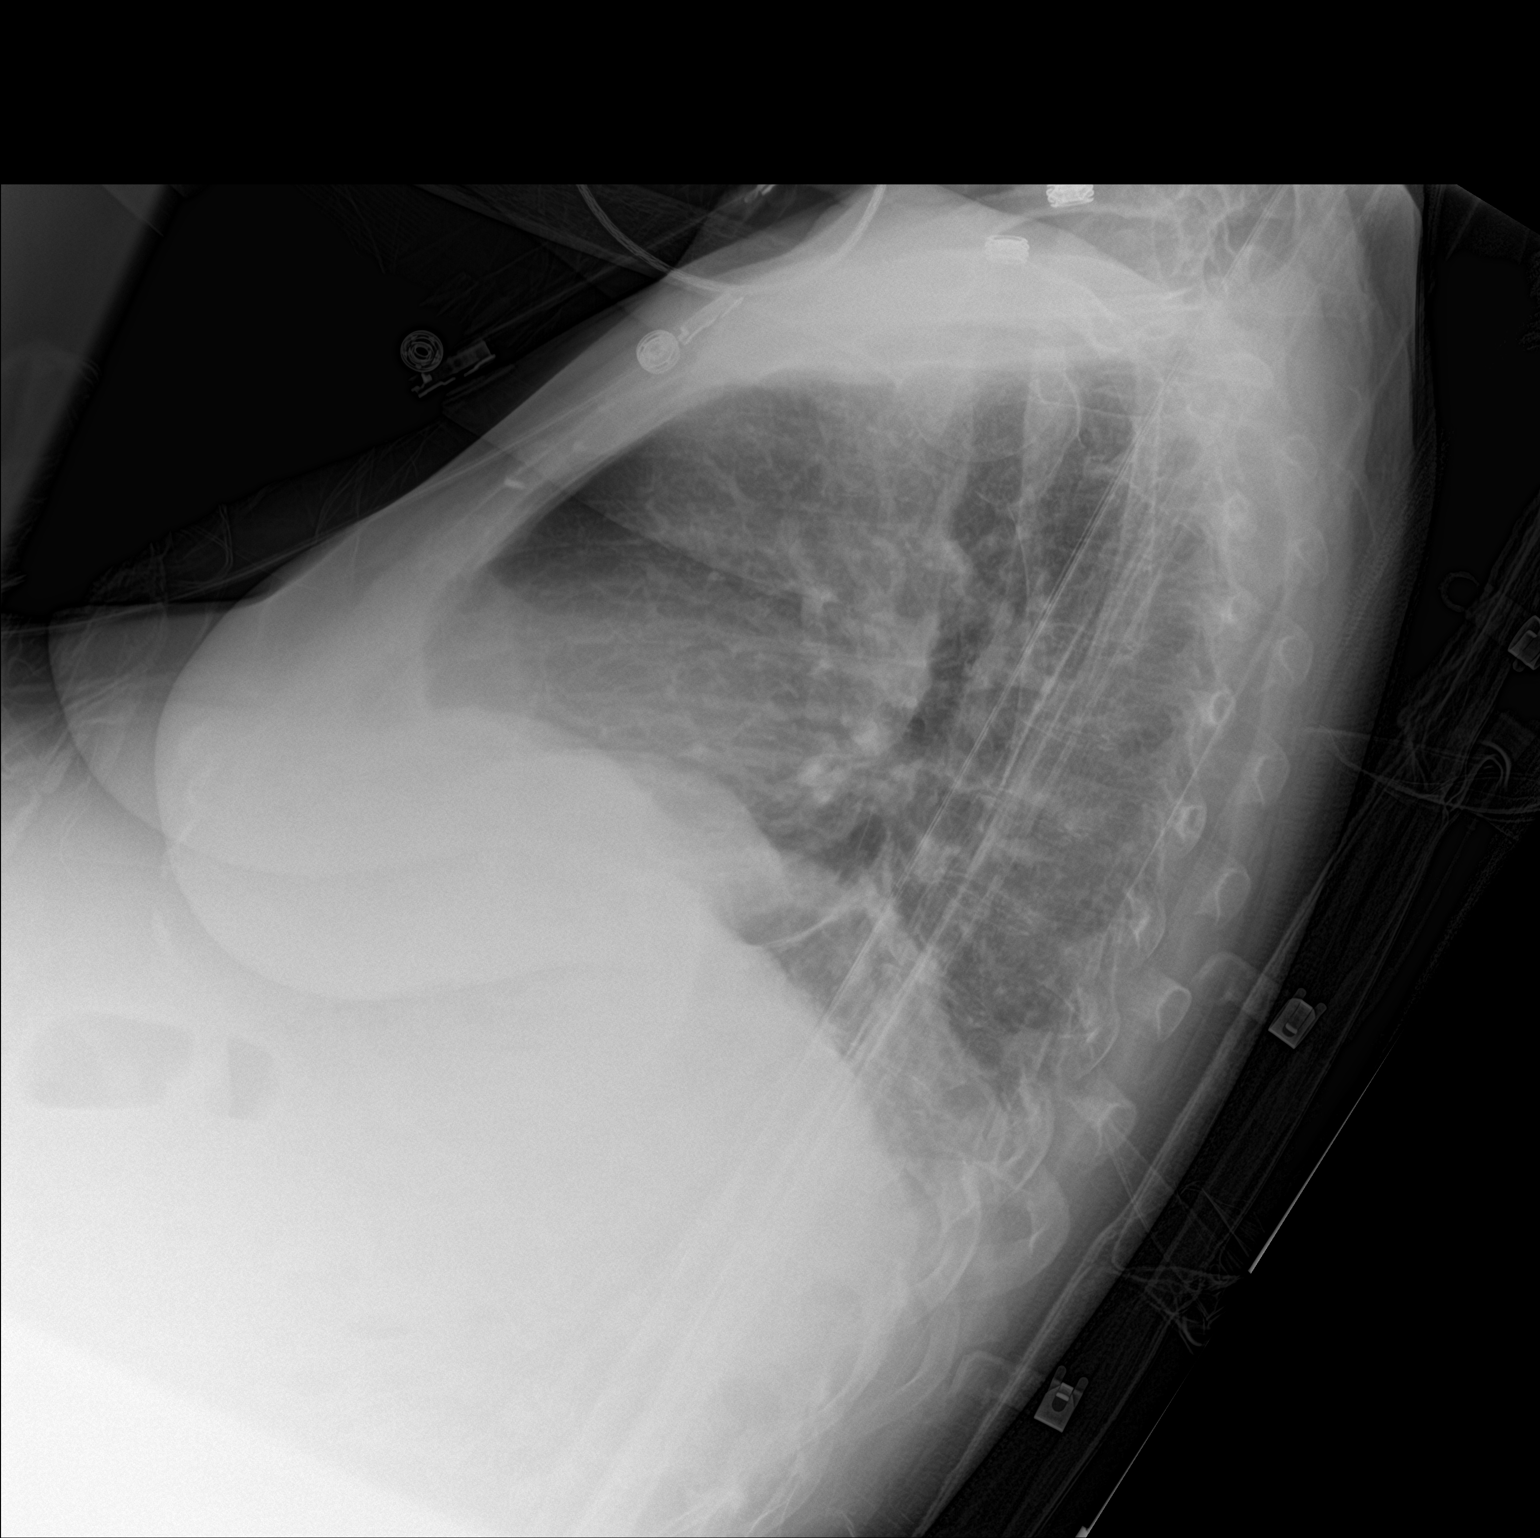

[2 of 2 positions shown; findings below may reference images not displayed]

FINDINGS: Low volume chest with interstitial crowding and atelectasis. There
is no edema, consolidation, effusion, or pneumothorax. Normal heart
size and mediastinal contours for technique.
IMPRESSION: Low volume chest without acute finding.

## 2016-12-19 NOTE — Progress Notes (Signed)
PROGRESS NOTE  Wanda [name] PFX:[id number] DOB: 12-16-1975 DOA: 12/13/2016 PCP: [name], MD  Brief Narrative: 41 year old woman PMH end-stage renal disease, cirrhosis presented to the emergency department with right leg pain. Found to have massive volume overload secondary to noncompliance with hemodialysis, admitted for treatment of massive volume overload.  Assessment/Plan #1: Massive volume overload, anasarca secondary to noncompliance with hemodialysis and underlying cirrhosis -Improving with hemodialysis. Still has significant volume overload. Respiratory status stable.  #2: End-stage renal disease. -Management per nephrology. Hemodialysis as an outpatient typically Tuesday, Thursday, Saturday. -Nephrology plans next treatment 5/14  #3: Cirrhosis secondary to steatosis, complications including esophageal varices, ascites, thrombocytopenia -Remains stable. Continue lactulose.  #4: Chronic hypotension, presumed autonomic dysfunction. Asymptomatic. -Continue midodrine   #5: Right leg pain. Muscular nature, complicated by significant edema. X-ray was negative. -Physical therapy home health  #6: Anemia of end-stage renal disease remains stable  #7: Noncompliance. Major factor and recurrent hospitalizations. She is refusing some medical therapy here. In discussion today, transportation appears to be an issue to her outpatient hemodialysis appointments. She typically takes dialysis Tuesday, Thursday, Saturday. Given her noncompliance, risk of rehospitalization is high, especially if discharged with 4 days to next treatment. -Continue to monitor. Discussed with case management, hopefully arrangements can be made for reliable transportation to prevent further noncompliance with outpatient hemodialysis -Plan next treatment in 48 hours as per nephrology or sooner if needed   DVT prophylaxis: heparin (patient refusing) Code Status: full Family Communication: none Disposition  Plan: home    Murray Hodgkins, MD  [facility] Direct contact: [phone] --[address] [place]  --[email or web address]; password [email or web address]  7PM-7AM contact night coverage as above 12/19/2016, 1:02 PM  LOS: 6 days   Consultants:  Nephrology  Procedures:  5/7: Large volume therapeutic paracentesis, 5.4 L   Antimicrobials:  Ciprofloxacin 5/6 >> 5/9 (patient refused all doses)  Interval history/Subjective: Feels okay. Eating fairly well. Still has significant lower extremity edema.  Objective: Afebrile. Temperature 98.6. Respirations 16. Pulse 78. Blood pressure 90/42. Oxygen saturation 98% on room air.   Constitutional: Appears calm, comfortable, eating lunch  Respiratory: Clear to auscultation bilaterally no wheezes, rales or rhonchi. Normal respiratory effort.  Cardiovascular: Regular rate and rhythm. 2/6 systolic murmur. No rub or gallop. 3+ bilateral lower extremity edema, modest improvement.  Psychiatric: Flat affect.   I have personally reviewed the following:  -5.8 L since admission  Labs:  Basic metabolic panel reviewed, consistent with end-stage renal disease. Potassium normal, 4.1.  Hemoglobin remained stable 7.4. Platelets 154. WBC 8.1.  Imaging studies:    Scheduled Meds: . clotrimazole  1 application Topical TID  . epoetin (EPOGEN/PROCRIT) injection  12,000 Units Subcutaneous Q M,W,F-HD  . fluticasone  1 spray Each Nare Daily  . heparin  5,000 Units Subcutaneous Q8H  . lactulose  10-20 g Oral TID  . lidocaine  15 mL Mouth/Throat TID AC  . midodrine  10 mg Oral TID WC  . pantoprazole  40 mg Oral Daily  . sevelamer carbonate  1,600 mg Oral With snacks  . sevelamer carbonate  3,200 mg Oral TID WC  . sodium chloride flush  3 mL Intravenous Q12H  . sucralfate  1 g Oral TID WC & HS   Continuous Infusions: . sodium chloride    . sodium chloride    . sodium chloride    . sodium chloride    . sodium chloride    . sodium chloride    . sodium chloride  Principal Problem:   Volume overload Active Problems:   Anasarca   Bipolar disorder (HCC)   Anemia of chronic disease   Ascites of liver   Umbilical hernia without obstruction and without gangrene   Liver cirrhosis secondary to NASH (nonalcoholic steatohepatitis) (HCC)   ESRD (end stage renal disease) ([place])   LOS: 6 days

## 2016-12-19 NOTE — Progress Notes (Signed)
Subjective: Interval History: No new complaint . Complains of weakness Objective: Vital signs in last 24 hours: Temp:  [97.8 F (36.6 C)-98.8 F (37.1 C)] 98.6 F (37 C) (05/12 0634) Pulse Rate:  [70-84] 78 (05/12 0634) Resp:  [16-20] 16 (05/12 0634) BP: (77-97)/(34-52) 90/42 (05/12 0634) SpO2:  [98 %-100 %] 98 % (05/12 0634) Weight:  [73.1 kg (161 lb 2.5 oz)] 73.1 kg (161 lb 2.5 oz) (05/11 1115) Weight change:   Intake/Output from previous day: 05/11 0701 - 05/12 0700 In: 723 [P.O.:720; I.V.:3] Out: 2600  Intake/Output this shift: No intake/output data recorded.  Generally patient is alert and in no apparent distress Chest decreased breath sound bilaterally Heart exam revealed regular rate and rhythm Abdomen: Full Extremities she has 1+ edema   Lab Results:  Recent Labs  12/16/16 0916 12/18/16 1205  WBC 7.4 8.1  HGB 7.4* 7.7*  HCT 23.4* 24.7*  PLT 159 154   BMET:   Recent Labs  12/16/16 0916 12/18/16 1205  NA 134* 132*  K 4.2 4.1  CL 100* 96*  CO2 26 25  GLUCOSE 82 94  BUN 41* 46*  CREATININE 6.02* 6.90*  CALCIUM 8.4* 8.9   No results for input([name]): PTH in the last 72 hours. Iron Studies:   Recent Labs  12/16/16 0916  IRON 49  TIBC 183*  FERRITIN 140    Studies/Results: No results found.  I have reviewed the patient'[name] current medications.  Assessment/Plan: 1] fluid overload: Has improved progressively. Were able to remove about 2700 mL yesterday with dialysis. However her blood pressure remains low went very difficult to remove more fluid.. 2] end-stage renal disease: Presently patient doesn't have any nausea or vomiting. Her potassium is normal  3] metabolic and bone disorder: Her calcium and phosphorus is range.Patient is on Renvela but patient refused to take her binder. Her phosphorus is increasing. 4] anemia: Her hemoglobin is low but improving. Her anemia is due to chronic renal failure and she is on Epogen. 4] hypotension: Recurrent  problem. Patient on Midodrine but continued to have intra-dialytic hypotension 6] liver cirrhosis with ascites Plan: 1] patient advised to take her medication or regular basis. 2] patient doesn't need dialysis today and her next dialysis will be on Monday    LOS: 6 days   Wanda [name] 12/19/2016,7:48 AM

## 2016-12-19 NOTE — Progress Notes (Signed)
Patient is noncompliant with most of her medications.  Patient refused Heparin, Renvela, and would only take 7.5 ml of  Lactulose.

## 2016-12-20 IMAGING — DX DG CHEST 1V PORT
1 series · 1 of 1 positions shown · non-contrast
Comparison: 05/19/2016

CLINICAL DATA: Central line placement.

EXAM:
PORTABLE CHEST 1 VIEW

[chest ap]
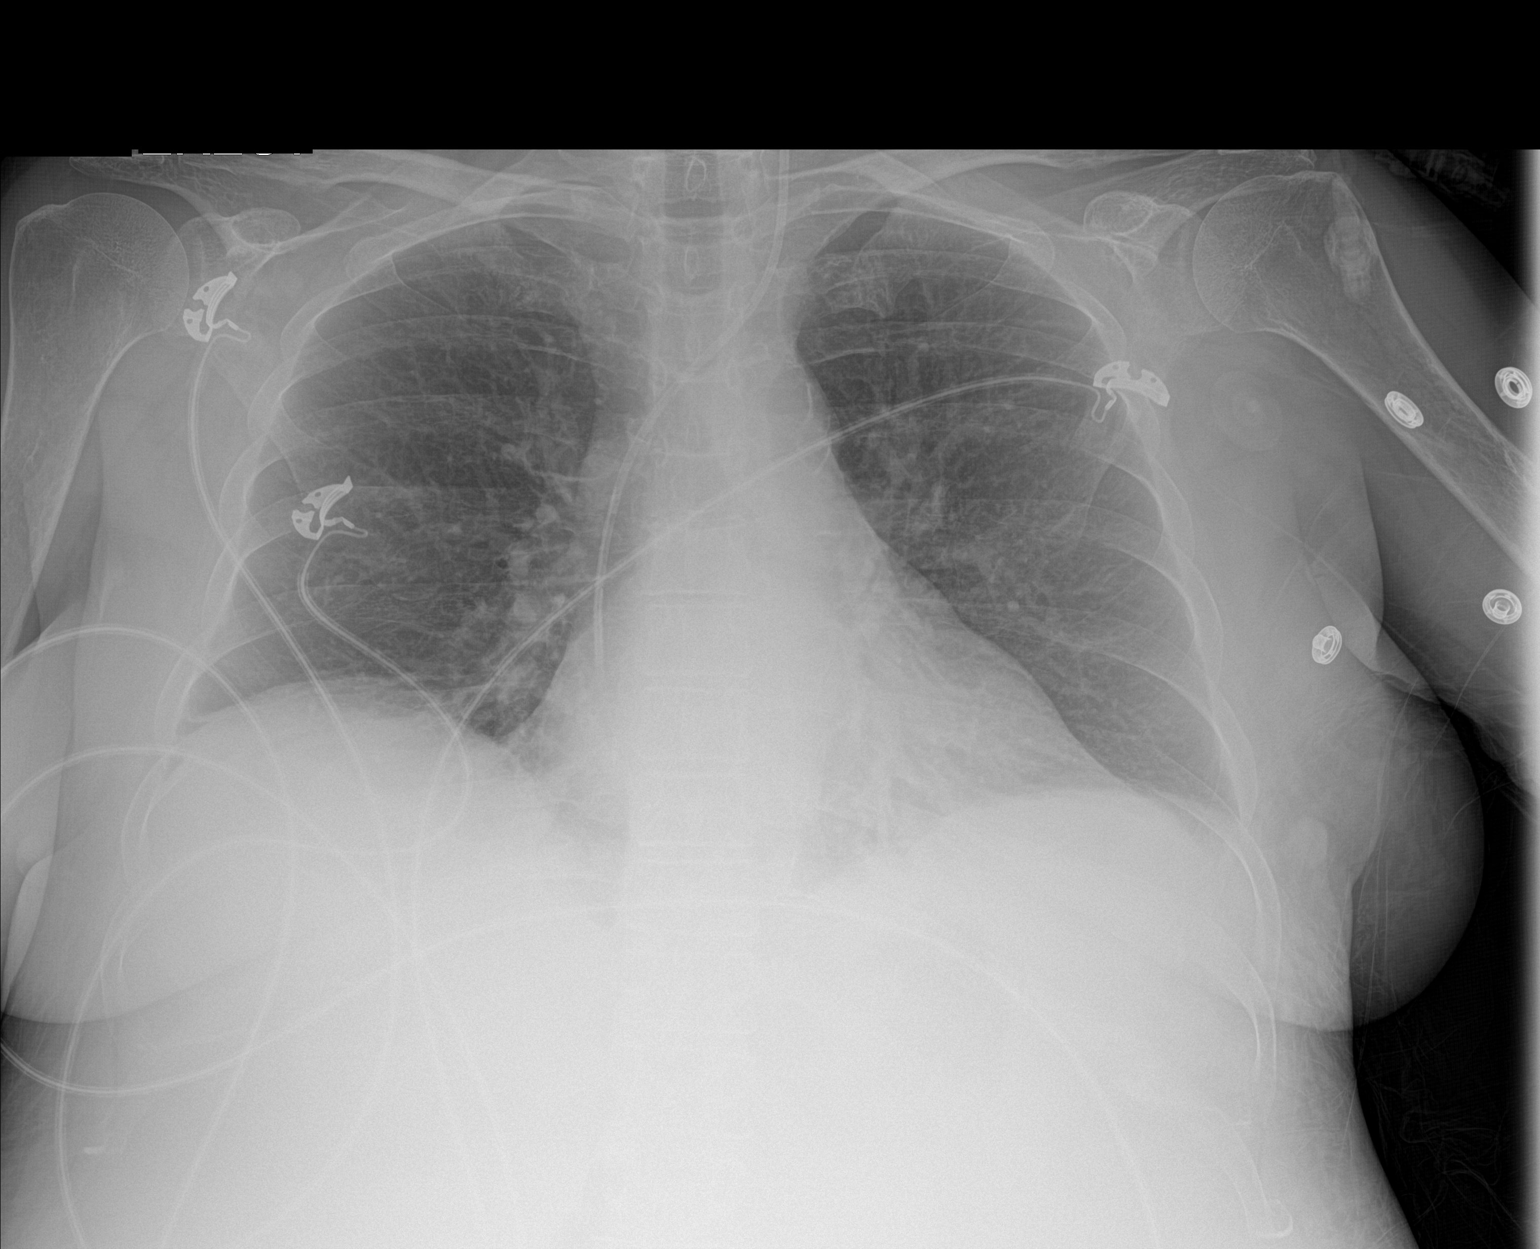

[1 of 1 positions shown; findings below may reference images not displayed]

FINDINGS: The left IJ central venous catheter tip is in the distal SVC near
the cavoatrial junction. No complicating features. The cardiac
silhouette, mediastinal and hilar contours are within normal limits
and stable. Stable tortuosity and calcification of the thoracic
aorta. Low lung volumes with vascular crowding and streaky bibasilar
atelectasis.
IMPRESSION: Left IJ central venous catheter in good position with the tip near
the cavoatrial junction.

## 2016-12-20 NOTE — Progress Notes (Signed)
Subjective: Interval History: Still the same. I cant move that mauch Objective: Vital signs in last 24 hours: Temp:  [98.4 F (36.9 C)-98.6 F (37 C)] 98.6 F (37 C) (05/13 0525) Pulse Rate:  [80-82] 80 (05/13 0525) Resp:  [16-20] 16 (05/13 0525) BP: (89-96)/(29-49) 92/43 (05/13 0525) SpO2:  [98 %-99 %] 98 % (05/13 0525) Weight change:   Intake/Output from previous day: 05/12 0701 - 05/13 0700 In: 603 [P.O.:600; I.V.:3] Out: 1 [Stool:1] Intake/Output this shift: No intake/output data recorded.  Generally patient is alert and in no apparent distress Chest decreased breath sound bilaterally Heart exam revealed regular rate and rhythm Abdomen: Full Extremities she has 1+ edema   Lab Results:  Recent Labs  12/18/16 1205  WBC 8.1  HGB 7.7*  HCT 24.7*  PLT 154   BMET:   Recent Labs  12/18/16 1205  NA 132*  K 4.1  CL 96*  CO2 25  GLUCOSE 94  BUN 46*  CREATININE 6.90*  CALCIUM 8.9   No results for input(s): PTH in the last 72 hours. Iron Studies:  No results for input(s): IRON, TIBC, TRANSFERRIN, FERRITIN in the last 72 hours.  Studies/Results: No results found.  I have reviewed the patient's current medications.  Assessment/Plan: 1] fluid overload: Has improved progressively. Patient denies any difficulty breathing but feels still bloated with distended abdomen. 2] end-stage renal disease: Patient is asymptomatic with normal potassium.  3] metabolic and bone disorder: Her calcium and phosphorus is range.she is on a binder. 4] anemia: Her hemoglobin is low but improving. Her anemia is due to chronic renal failure and she is on Epogen. 4] hypotension: Recurrent problem. Patient on Midodrine but continued to have intra-dialytic hypotension 6] liver cirrhosis with ascites 7] non-compliance with treatment and medications. She said she took her binder yesterday. Plan: 1] we'll check her renal panel and CBC in the morning 2] we'll continue his Epogen 3] we'll  make arrangements for patient to get dialysis tomorrow and will try to remove about 3 L if her blood pressure tolerates.    LOS: 7 days   Elsia Lasota S 12/20/2016,7:38 AM

## 2016-12-20 NOTE — Progress Notes (Signed)
PROGRESS NOTE  Wanda [name] ZOX:[id number] DOB: 12-21-1975 DOA: 12/13/2016 PCP: Wanda Simmer, MD  Brief Narrative: 41 year old woman PMH end-stage renal disease, cirrhosis presented to the emergency department with right leg pain. Found to have massive volume overload secondary to noncompliance with hemodialysis, admitted for treatment of massive volume overload.  Assessment/Plan #1: Massive volume overload, anasarca secondary to noncompliance with hemodialysis and underlying cirrhosis -Still has significant volume overload. Hemodialysis planned tomorrow.  #2: End-stage renal disease. -Next treatment 5/14, anticipate discharge thereafter.  #3: Cirrhosis secondary to steatosis, complications including esophageal varices, ascites, thrombocytopenia -Stable. Mostly compliant with lactulose.  #4: Chronic hypotension, presumed autonomic dysfunction.  -Remains asymptomatic. Continue Midrin.  #5: Right leg pain. Muscular nature, complicated by significant edema. X-ray was negative. -Continue walker use as at home, plan home health physical therapy  #6: Anemia of end-stage renal disease -Stable.  #7: Noncompliance. Continues to be noncompliant with medications including heparin, Flonase, Protonix, Renvela.  -Difficult case, high risk for rehospitalization. Transportation issue. Plan hemodialysis tomorrow and discharge home, then resume usual schedule of Tuesday, Thursday, Saturday.  -Patient has refused palliative medicine intervention  -We'll discuss with case management to see what else might be offered patient. Hopefully transportation to hemodialysis sessions could be arranged.  DVT prophylaxis: heparin (patient refusing) Code Status: full Family Communication: none Disposition Plan: home    Wanda Hodgkins, MD  [facility] Direct contact: [phone] --[address] [place]  --[email or web address]; password [email or web address]  7PM-7AM contact night coverage as above 12/20/2016, 10:02 AM   LOS: 7 days   Consultants:  Nephrology  Procedures:  5/7: Large volume therapeutic paracentesis, 5.4 L   Antimicrobials:  Ciprofloxacin 5/6 >> 5/9 (patient refused all doses)  Interval history/Subjective: No new issues. Breathing okay. Legs feel heavy.  Objective: Afebrile. Temperature 98.6, respirations 16, pulse 80, blood pressure 92/43, SPO2 98% on room air.   Constitutional: Appears comfortable, calm, lying in bed.  Respiratory: Clear to auscultation bilaterally. No wheezes, rales or rhonchi. Normal respiratory effort.  Cardiovascular: 2/6 systolic murmur. No rub or gallop. No change in 3+ bilateral lower extremity edema.  Musculoskeletal. Ms. Wanda [name] likes to command. Generally weak.  Psychiatric: Flat affect.   I have personally reviewed the following:  -5.3 L since admission  Labs:    Imaging studies:    Scheduled Meds: . clotrimazole  1 application Topical TID  . epoetin (EPOGEN/PROCRIT) injection  12,000 Units Subcutaneous Q M,W,F-HD  . fluticasone  1 spray Each Nare Daily  . heparin  5,000 Units Subcutaneous Q8H  . lactulose  10-20 g Oral TID  . lidocaine  15 mL Mouth/Throat TID AC  . midodrine  10 mg Oral TID WC  . pantoprazole  40 mg Oral Daily  . sevelamer carbonate  1,600 mg Oral With snacks  . sevelamer carbonate  3,200 mg Oral TID WC  . sodium chloride flush  3 mL Intravenous Q12H  . sucralfate  1 g Oral TID WC & HS   Continuous Infusions: . sodium chloride    . sodium chloride    . sodium chloride    . sodium chloride    . sodium chloride    . sodium chloride    . sodium chloride      Principal Problem:   Volume overload Active Problems:   Anasarca   Bipolar disorder (HCC)   Anemia of chronic disease   Ascites of liver   Umbilical hernia without obstruction and without gangrene   Liver cirrhosis secondary to NASH (nonalcoholic steatohepatitis) (  [place])   ESRD (end stage renal disease) ([place])   LOS: 7 days

## 2016-12-21 ENCOUNTER — Inpatient Hospital Stay (HOSPITAL_COMMUNITY): Payer: Medicare Other

## 2016-12-21 LAB — RENAL FUNCTION PANEL
ANION GAP: 12 (ref 5–15)
Albumin: 2.6 g/dL — ABNORMAL LOW (ref 3.5–5.0)
BUN: 54 mg/dL — AB (ref 6–20)
CO2: 24 mmol/L (ref 22–32)
Calcium: 9.4 mg/dL (ref 8.9–10.3)
Chloride: 99 mmol/L — ABNORMAL LOW (ref 101–111)
Creatinine, Ser: 7.25 mg/dL — ABNORMAL HIGH (ref 0.44–1.00)
GFR calc Af Amer: 7 mL/min — ABNORMAL LOW (ref >60)
GFR calc non Af Amer: 6 mL/min — ABNORMAL LOW (ref >60)
Glucose, Bld: 86 mg/dL (ref 65–99)
POTASSIUM: 4.6 mmol/L (ref 3.5–5.1)
Phosphorus: 4.5 mg/dL (ref 2.5–4.6)
Sodium: 135 mmol/L (ref 135–145)

## 2016-12-21 LAB — CBC
HEMATOCRIT: 25.2 % — AB (ref 36.0–46.0)
HEMOGLOBIN: 7.6 g/dL — AB (ref 12.0–15.0)
MCH: 29.7 pg (ref 26.0–34.0)
MCHC: 30.2 g/dL (ref 30.0–36.0)
MCV: 98.4 fL (ref 78.0–100.0)
Platelets: 144 10*3/uL — ABNORMAL LOW (ref 150–400)
RBC: 2.56 MIL/uL — ABNORMAL LOW (ref 3.87–5.11)
RDW: 19.1 % — AB (ref 11.5–15.5)
WBC: 6.9 10*3/uL (ref 4.0–10.5)

## 2016-12-21 MED ORDER — SODIUM CHLORIDE 0.9 % IV SOLN: 100.0000 mL | INTRAVENOUS | Status: DC | PRN

## 2016-12-21 MED ORDER — EPOETIN ALFA 20000 UNIT/ML IJ SOLN
INTRAMUSCULAR | Status: AC
  Administered 2016-12-21: 12000 [IU] via SUBCUTANEOUS
  Filled 2016-12-21: qty 1

## 2016-12-21 MED ORDER — HEPARIN SODIUM (PORCINE) 1000 UNIT/ML DIALYSIS
1000.0000 [IU] | INTRAMUSCULAR | Status: DC | PRN
  Filled 2016-12-21: qty 1

## 2016-12-21 MED ORDER — MIDODRINE HCL 10 MG PO TABS: 10.0000 mg | ORAL_TABLET | Freq: Three times a day (TID) | ORAL | Status: DC

## 2016-12-21 MED ORDER — ALBUMIN HUMAN 25 % IV SOLN
25.0000 g | Freq: Once | INTRAVENOUS | Status: AC
  Administered 2016-12-21: 25 g via INTRAVENOUS
  Filled 2016-12-21: qty 100

## 2016-12-21 MED ORDER — LACTULOSE 10 GM/15ML PO SOLN: 10.0000 g | Freq: Three times a day (TID) | ORAL | Status: AC

## 2016-12-21 MED ORDER — ALTEPLASE 2 MG IJ SOLR
2.0000 mg | Freq: Once | INTRAMUSCULAR | Status: DC | PRN
  Filled 2016-12-21: qty 2

## 2016-12-21 MED ORDER — ALBUMIN HUMAN 25 % IV SOLN
INTRAVENOUS | Status: AC
  Administered 2016-12-21: 25 g via INTRAVENOUS
  Filled 2016-12-21: qty 200

## 2016-12-21 NOTE — Clinical Social Work Note (Addendum)
LCSW received referral in regards to patient having transportation needs to dialysis.  Patient stated that she missed one treatment due to transportation needs. She indicated that she does not have an ongoing deficit in transportation. She stated that [facility] changed her chair days from Tuesday, Thursday, Saturday to Tuesday, Thursday, Friday due to her not having transportation on the weekend. She stated that her boyfriend provides her with transportation to and from dialysis.  Patient stated that this change was made "a while ago."   Patient does not have ongoing transportation needs. LCSW is signing off.        [place] Bueche, Clydene Pugh, LCSW

## 2016-12-21 NOTE — Discharge Summary (Signed)
Physician Discharge Summary  Wanda [name] FWY:[id number] DOB: [birth year] DOA: 12/13/2016  PCP: [name], MD  Admit date: 12/13/2016 Discharge date: 12/21/2016  Recommendations for Outpatient Follow-up:  1. Continue to encourage compliance. 2. Ongoing care for cirrhosis. I've sent a message to her gastroenterologist to notify of hospitalization and help coordinate any recurrent need for paracentesis.   Follow-up Information    [name], MD. Schedule an appointment as soon as possible for a visit in 1 week(s).   Specialty:  Nurse Practitioner Contact information: [address] [place] [address] [phone]           Filed Weights   12/18/16 1115 12/21/16 0531 12/21/16 1045  Weight: 73.1 kg (161 lb 2.5 oz) 76.8 kg (169 lb 5 oz) 76.9 kg (169 lb 8.5 oz)    Discharge Diagnoses:  1. Massive volume overload, anasarca, secondary to noncompliance with hemodialysis, complicated by underlying cirrhosis 2. End-stage renal disease 3. 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 Chronic hypotension, presumed autonomic dysfunction 5. Anemia of end-stage renal disease 6. Noncompliance  Discharge Condition: Improved Disposition: Home  Diet recommendation: Low-salt  Filed Weights   12/18/16 1115 12/21/16 0531 12/21/16 1045  Weight: 73.1 kg (161 lb 2.5 oz) 76.8 kg (169 lb 5 oz) 76.9 kg (169 lb 8.5 oz)    History of present illness:  41 year old woman PMH end-stage renal disease, cirrhosis presented to the emergency department with right leg pain. Found to have massive volume overload secondary to noncompliance with hemodialysis, admitted for treatment of massive volume overload.  Hospital Course:  Patient was admitted and treated with hemodialysis by nephrology. She had gradual improvement in massive volume overload. From a pulmonary standpoint she has remained stable and without hypoxia. She underwent paracentesis during hospitalization with symptomatic improvement.She  has been cleared by nephrology for discharge and will resume her usual schedule of hemodialysis Tuesday, Thursday, Saturday.  1. Massive volume overload and anasarca secondary to noncompliance with hemodialysis underlying cirrhosis improved with hemodialysis.  2. 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 Did well status post paracentesis. Mostly compliant with lactulose. 3. Chronic hypotension, presumed autonomic dysfunction, remains asymptomatic. Continue Midodrine TID 4. Anemia of end-stage renal disease has remained stable 5. Noncompliance continues to be an ongoing issue for unclear reasons. Despite multiple attempts she was unwilling to discuss any barriers that may be present to compliance. She declined palliative care consultation this admission. Per social work she has no issues with transport to dialysis.  Today's assessment: S: Feels okay. Breathing okay. O: Vitals: Afebrile, temperature 98.4, respirations 18, pulse 85, blood pressure 87/34, SPO2 99% on room air.   Constitutional. Appears calm, comfortable.  Cardiovascular: Regular rate and rhythm. 2/6 systolic murmur, no rub or gallop. No change in 3+ bilateral lower extremity edema.  Respiratory: Clear to auscultation bilaterally. No wheezes, rales or rhonchi. Normal respiratory effort.  Abdomen soft, distended, nontender. Significant ascites noted.  Psychiatric. Flat affect. Depressed mood.  Discharge Instructions  Discharge Instructions    Diet - low sodium heart healthy    Complete by:  As directed    Discharge instructions    Complete by:  As directed    Call your physician or seek immediate medical attention for swelling, weight gain, shortness of breath, pain or worsening of condition.   Increase activity slowly    Complete by:  As directed      Allergies as of 12/21/2016      Reactions   Iohexol Rash   Pt states  rash after last CT. Pt needs  pre-meds   Lasix [furosemide] Other (See Comments)   "doesn't work"   Latex Itching      Medication List    TAKE these medications   clotrimazole 1 % cream Commonly known as:  LOTRIMIN Apply 1 application topically 3 (three) times daily.   Darbepoetin Alfa 60 MCG/0.3ML Sosy injection Commonly known as:  ARANESP Inject 0.3 mLs (60 mcg total) into the vein every Thursday with hemodialysis. What changed:  when to take this   lactulose 10 GM/15ML solution Commonly known as:  CHRONULAC Take 15-30 mLs (10-20 g total) by mouth 3 (three) times daily. TAKE 15-30 ml BY MOUTH three times a day What changed:  how much to take   lidocaine 2 % solution Commonly known as:  XYLOCAINE TAKE TWO TEASPOONSFUL (10ML) BY MOUTH BEFORE MEALS AND AT BEDTIME TO PREVENT CHEST PAIN WHILE EATING   lidocaine-prilocaine cream Commonly known as:  EMLA Apply 1 application topically as needed (for dialysis treatments).   LORazepam 1 MG tablet Commonly known as:  ATIVAN Take 1 mg by mouth 2 (two) times daily as needed for anxiety (and/or back spasms).   midodrine 10 MG tablet Commonly known as:  PROAMATINE Take 1 tablet (10 mg total) by mouth 3 (three) times daily. What changed:  when to take this   omeprazole 20 MG capsule Commonly known as:  PRILOSEC 1 po every morning 30 minutes prior to your first meal. What changed:  how much to take  how to take this  when to take this  additional instructions   Oxycodone HCl 10 MG Tabs Take 0.5 tablets (5 mg total) by mouth 3 (three) times daily as needed. 1 PO TID AS NEEDED FOR PAIN What changed:  how much to take  additional instructions   RENVELA 800 MG tablet Generic drug:  sevelamer carbonate Take 1600 mg by mouth 3 times daily with meals. Take 800 mg by mouth with snacks.   sucralfate 1 GM/10ML suspension Commonly known as:  CARAFATE Take 1 g by mouth 4 (four) times daily -  with meals and at bedtime.      Allergies  Allergen  Reactions  . Iohexol Rash    Pt states rash after last CT. Pt needs pre-meds  . Lasix [Furosemide] Other (See Comments)    "doesn't work"  . Latex Itching    The results of significant diagnostics from this hospitalization (including imaging, microbiology, ancillary and laboratory) are listed below for reference.    Significant Diagnostic Studies: Ct Abdomen Pelvis Wo Contrast  Result Date: 11/23/2016 CLINICAL DATA:  Acute onset of worsening abdominal distention. Initial encounter. EXAM: CT ABDOMEN AND PELVIS WITHOUT CONTRAST TECHNIQUE: Multidetector CT imaging of the abdomen and pelvis was performed following the standard protocol without IV contrast. COMPARISON:  CT of the abdomen and pelvis performed 10/30/2016 FINDINGS: Lower chest: Trace bilateral pleural fluid is noted. Diffuse coronary artery calcifications are seen. Hepatobiliary: The nodular contour of the liver is compatible with hepatic cirrhosis. No definite mass is characterized. Stones are seen within the gallbladder. The gallbladder is otherwise grossly unremarkable. The common bile duct remains normal in caliber. 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 Pancreas: The pancreas is within normal limits. Spleen: The spleen is unremarkable in appearance. Adrenals/Urinary Tract: The adrenal glands are unremarkable in appearance. Mild bilateral renal atrophy is noted. There is no evidence of hydronephrosis. No renal or ureteral stones are identified. No perinephric stranding is seen. Stomach/Bowel: The  appendix is normal in caliber, without evidence of appendicitis. Vague wall thickening is noted along the ascending colon, concerning for infectious or inflammatory colitis. The remainder of the colon is grossly unremarkable. Contrast is noted within the small bowel. The small bowel is grossly unremarkable in appearance. The stomach is grossly unremarkable in appearance. Vascular/Lymphatic: Scattered calcification is seen  along the abdominal aorta and its branches. The abdominal aorta is otherwise grossly unremarkable. The inferior vena cava is grossly unremarkable. No retroperitoneal lymphadenopathy is seen. No pelvic sidewall lymphadenopathy is identified. Reproductive: The bladder is decompressed and not well assessed. The uterus is grossly unremarkable. The ovaries are relatively symmetric. No suspicious adnexal masses are seen. Other: Relatively large volume ascites is seen within the abdomen and pelvis, extending into a moderate umbilical hernia. Diffuse soft tissue edema is seen tracking along the abdominal and pelvic wall. Asymmetric edema and subcutaneous inflammation are noted at the left breast. Musculoskeletal: No acute osseous abnormalities are identified. The visualized musculature is unremarkable in appearance. IMPRESSION: 1. Vague wall thickening along the ascending colon, concerning for infectious or inflammatory colitis. 2. Relatively large volume ascites within the abdomen and pelvis, extending into a moderate umbilical hernia. 3. Diffuse soft tissue edema tracking along the abdominal and pelvic wall. 4. Asymmetric edema and subcutaneous inflammation noted at the left breast. This could reflect underlying systemic edema, though would correlate clinically to exclude inflammatory cancer of the left breast. 5. Findings of hepatic cirrhosis, with prominent splenic, gastric and right renal varices. 6. Trace bilateral pleural fluid. 7. Diffuse coronary artery calcifications seen. 8. Cholelithiasis.  Gallbladder otherwise unremarkable. 9. Mild bilateral renal atrophy noted. 10. Scattered aortic atherosclerosis. Electronically Signed   By: Garald Balding M.D.   On: 11/23/2016 18:08   Dg Chest 2 View  Result Date: 12/13/2016 CLINICAL DATA:  Leg and abdominal swelling, no dialysis since April 28th. History of cirrhosis. EXAM: CHEST  2 VIEW COMPARISON:  Chest radiograph Dec 08, 2016 FINDINGS: Cardiac silhouette is mildly  enlarged. Mediastinal silhouette is nonsuspicious. Calcified aortic knob. Similar pulmonary vascular congestion and chronic bronchitic changes without pleural effusion or focal consolidation Included abdomen demonstrates distention with bowel air-fluid levels at a similar level. IMPRESSION: Stable cardiomegaly and pulmonary vascular congestion. Mild bronchitic changes without focal consolidation. Suspected ascites. Electronically Signed   By: Elon Alas M.D.   On: 12/13/2016 05:29   US Paracentesis  Result Date: 12/14/2016 INDICATION: Cirrhosis, ascites EXAM: ULTRASOUND GUIDED THERAPEUTIC PARACENTESIS MEDICATIONS: None. COMPLICATIONS: None immediate. PROCEDURE: Procedure, benefits, and risks of procedure were discussed with patient. Written informed consent for procedure was obtained. Time out protocol followed. Adequate collection of ascites localized by ultrasound in RIGHT lower quadrant. Skin prepped and draped in usual sterile fashion. Skin and soft tissues anesthetized with 10 mL of 1% lidocaine. 5 [place] Yueh catheter placed into peritoneal cavity. 5.4 L of yellow fluid aspirated by vacuum bottle suction. Procedure tolerated well by patient without immediate complication. FINDINGS: As above IMPRESSION: Successful ultrasound-guided paracentesis yielding 5.4 liters of peritoneal fluid. Electronically Signed   By: Lavonia Dana M.D.   On: 12/14/2016 15:36   Dg Hip Unilat W Or Wo Pelvis 2-3 Views Right  Result Date: 12/13/2016 CLINICAL DATA:  Leg and abdominal swelling, no dialysis since April 28th. History of cirrhosis. EXAM: DG HIP (WITH OR WITHOUT PELVIS) 2-3V RIGHT COMPARISON:  None. FINDINGS: There is no evidence of hip fracture or dislocation. There is no evidence of arthropathy or other focal bone abnormality. Moderate vascular calcifications. Large body  habitus. IMPRESSION: No acute osseous process. Electronically Signed   By: Elon Alas M.D.   On: 12/13/2016 05:30     Microbiology: Recent Results (from the past 240 hour(s))  Blood culture (routine x 2)     Status: None   Collection Time: 12/13/16  4:17 AM  Result Value Ref Range Status   Specimen Description LEFT ANTECUBITAL  Final   Special Requests   Final    BOTTLES DRAWN AEROBIC AND ANAEROBIC Blood Culture results may not be optimal due to an excessive volume of blood received in culture bottles   Culture NO GROWTH 5 DAYS  Final   Report Status 12/18/2016 FINAL  Final  Blood culture (routine x 2)     Status: None   Collection Time: 12/13/16  4:26 AM  Result Value Ref Range Status   Specimen Description BLOOD RIGHT HAND  Final   Special Requests   Final    BOTTLES DRAWN AEROBIC ONLY Blood Culture results may not be optimal due to an inadequate volume of blood received in culture bottles   Culture NO GROWTH 5 DAYS  Final   Report Status 12/18/2016 FINAL  Final     Labs: Basic Metabolic Panel:  Recent Labs Lab 12/15/16 0415 12/16/16 0916 12/18/16 1205 12/21/16 1141  NA 136 134* 132* 135  K 4.4 4.2 4.1 4.6  CL 98* 100* 96* 99*  CO2 27 26 25 24   GLUCOSE 104* 82 94 86  BUN 29* 41* 46* 54*  CREATININE 5.27* 6.02* 6.90* 7.25*  CALCIUM 8.4* 8.4* 8.9 9.4  PHOS 3.7 3.7 4.8* 4.5   Liver Function Tests:  Recent Labs Lab 12/15/16 0415 12/16/16 0916 12/18/16 1205 12/21/16 1141  ALBUMIN 2.6* 2.5* 2.4* 2.6*   CBC:  Recent Labs Lab 12/15/16 0415 12/16/16 0916 12/18/16 1205 12/21/16 1141  WBC 6.4 7.4 8.1 6.9  HGB 7.2* 7.4* 7.7* 7.6*  HCT 23.4* 23.4* 24.7* 25.2*  MCV 100.0 98.3 99.2 98.4  PLT 166 159 154 144*    Principal Problem:   Volume overload Active Problems:   Anasarca   Bipolar disorder (HCC)   Anemia of chronic disease   Ascites of liver   Umbilical hernia without obstruction and without gangrene   Liver cirrhosis secondary to NASH (nonalcoholic steatohepatitis) (HCC)   ESRD (end stage renal disease) ([place])   Time coordinating discharge: 40  minutes  Signed:  Murray Hodgkins, MD [facility] 12/21/2016, 3:30 PM

## 2016-12-21 NOTE — Progress Notes (Addendum)
Patient ambulated in hallway approximately 200 feet with walker.  Patient tolerated ambulation well.  Wanda [name]

## 2016-12-21 NOTE — Care Management Note (Signed)
Case Management Note  Patient Details  Name: Wanda [name] MRN: [id number] Date of Birth: [birth year]   Expected Discharge Date:       12/21/2016           Expected Discharge Plan:  Home/Self Care  In-House Referral:     Discharge planning Services  CM Consult  Post Acute Care Choice:    Choice offered to:     DME Arranged:    DME Agency:     HH Arranged:    [place] Agency:     Status of Service:  In process, will continue to follow  If discussed at Long Length of Stay Meetings, dates discussed:    Additional Comments: Patient discharging home today after HD. Declines HH. CSW met with patient for help with transportation needs to dialysis center, patient reports no issues with transportation.  Freyja Govea, Chauncey Reading, RN 12/21/2016, 1:35 PM

## 2016-12-21 NOTE — Progress Notes (Signed)
Subjective: Interval History: Still complains of weakness. She has some pain when she is moving her right leg. Objective: Vital signs in last 24 hours: Temp:  [98.3 F (36.8 C)-98.4 F (36.9 C)] 98.4 F (36.9 C) (05/14 0531) Pulse Rate:  [77-85] 85 (05/14 0531) Resp:  [18] 18 (05/14 0531) BP: (86-97)/(34-47) 87/34 (05/14 0531) SpO2:  [98 %-99 %] 99 % (05/14 0531) Weight:  [76.8 kg (169 lb 5 oz)] 76.8 kg (169 lb 5 oz) (05/14 0531) Weight change:   Intake/Output from previous day: 05/13 0701 - 05/14 0700 In: 1080 [P.O.:1080] Out: 300 [Urine:300] Intake/Output this shift: No intake/output data recorded.  Generally patient is alert and in no apparent distress Chest decreased breath sound bilaterally Heart exam revealed regular rate and rhythm Abdomen: Distended, nontender Extremities she has 1+ edema   Lab Results:  Recent Labs  12/18/16 1205  WBC 8.1  HGB 7.7*  HCT 24.7*  PLT 154   BMET:   Recent Labs  12/18/16 1205  NA 132*  K 4.1  CL 96*  CO2 25  GLUCOSE 94  BUN 46*  CREATININE 6.90*  CALCIUM 8.9   No results for input(s): PTH in the last 72 hours. Iron Studies:  No results for input(s): IRON, TIBC, TRANSFERRIN, FERRITIN in the last 72 hours.  Studies/Results: No results found.  I have reviewed the patient's current medications.  Assessment/Plan: 1] fluid over load: Progressively improving. Patient is still with significant side of fluid overload. 2] end-stage renal disease: Her potassium is normal. Patient denies any nausea or vomiting.  3] metabolic and bone disorder: Her calcium and phosphorus is range.she is on a binder. 4] anemia: Her hemoglobin is low but improving. Her anemia is due to chronic renal failure and she is on Epogen. 4] hypotension: Recurrent problem. Patient on Midodrine but continued to have intra-dialytic hypotension 6] liver cirrhosis with ascites: Seems to be stable 7] non-compliance with treatment and medications.  Plan: 1]  we'll dialyze patient today 4 hours and will try to move about 3 L if her blood pressure remains stable.  2] we'll continue his Epogen 3] still discussed with the patient about compliance with her medication and dialysis treatment when she goes home. Presently patient states that she doesn't have any problem with transportation except Saturday. But patient states that she can come on Friday where she has been doing on and off.     LOS: 8 days   Jamiere Gulas S 12/21/2016,7:59 AM

## 2016-12-21 NOTE — Procedures (Signed)
    HEMODIALYSIS TREATMENT NOTE:   4 hour heparin-free dialysis completed via right upper arm AVF (16g/antegrade). Goal met: 3 liters removed without interruption in ultrafiltration.  SBP 80s throughout HD session (asymptomatic) despite albumin administration.  All blood was returned and hemsotasis was achieved within 10 minutes. Report given to Russ Halo, RN.  Rockwell Alexandria, RN, CDN

## 2016-12-21 NOTE — Progress Notes (Signed)
Patient refused IV stick.  Requested labs be drawn in dialysis.

## 2016-12-22 ENCOUNTER — Observation Stay (HOSPITAL_COMMUNITY): Payer: Medicare Other

## 2016-12-22 ENCOUNTER — Encounter (HOSPITAL_COMMUNITY): Payer: Self-pay | Admitting: *Deleted

## 2016-12-22 ENCOUNTER — Observation Stay (HOSPITAL_COMMUNITY)
Admission: EM | Admit: 2016-12-22 | Discharge: 2016-12-24 | Disposition: A | Discharge status: Home / Self Care | Payer: Medicare Other | Attending: Internal Medicine | Admitting: Internal Medicine

## 2016-12-22 DIAGNOSIS — G894 Chronic pain syndrome: Secondary | ICD-10-CM | POA: Diagnosis present

## 2016-12-22 DIAGNOSIS — K7581 Nonalcoholic steatohepatitis (NASH): Secondary | ICD-10-CM

## 2016-12-22 DIAGNOSIS — N186 End stage renal disease: Secondary | ICD-10-CM | POA: Insufficient documentation

## 2016-12-22 DIAGNOSIS — K7460 Unspecified cirrhosis of liver: Secondary | ICD-10-CM | POA: Diagnosis present

## 2016-12-22 DIAGNOSIS — I429 Cardiomyopathy, unspecified: Secondary | ICD-10-CM

## 2016-12-22 DIAGNOSIS — R601 Generalized edema: Principal | ICD-10-CM

## 2016-12-22 DIAGNOSIS — R238 Other skin changes: Secondary | ICD-10-CM | POA: Diagnosis not present

## 2016-12-22 DIAGNOSIS — K3189 Other diseases of stomach and duodenum: Secondary | ICD-10-CM | POA: Diagnosis present

## 2016-12-22 DIAGNOSIS — R0602 Shortness of breath: Secondary | ICD-10-CM

## 2016-12-22 DIAGNOSIS — M7989 Other specified soft tissue disorders: Secondary | ICD-10-CM | POA: Diagnosis present

## 2016-12-22 DIAGNOSIS — D696 Thrombocytopenia, unspecified: Secondary | ICD-10-CM | POA: Diagnosis present

## 2016-12-22 DIAGNOSIS — Z79899 Other long term (current) drug therapy: Secondary | ICD-10-CM | POA: Diagnosis not present

## 2016-12-22 DIAGNOSIS — Z992 Dependence on renal dialysis: Secondary | ICD-10-CM | POA: Insufficient documentation

## 2016-12-22 DIAGNOSIS — Z87891 Personal history of nicotine dependence: Secondary | ICD-10-CM | POA: Diagnosis not present

## 2016-12-22 DIAGNOSIS — L139 Bullous disorder, unspecified: Secondary | ICD-10-CM | POA: Diagnosis not present

## 2016-12-22 DIAGNOSIS — M79662 Pain in left lower leg: Secondary | ICD-10-CM

## 2016-12-22 DIAGNOSIS — K766 Portal hypertension: Secondary | ICD-10-CM

## 2016-12-22 LAB — CBC WITH DIFFERENTIAL/PLATELET
BASOS PCT: 1 %
Basophils Absolute: 0.1 10*3/uL (ref 0.0–0.1)
EOS ABS: 0.2 10*3/uL (ref 0.0–0.7)
Eosinophils Relative: 3 %
HCT: 28.7 % — ABNORMAL LOW (ref 36.0–46.0)
HEMOGLOBIN: 9 g/dL — AB (ref 12.0–15.0)
Lymphocytes Relative: 17 %
Lymphs Abs: 1.3 10*3/uL (ref 0.7–4.0)
MCH: 30.9 pg (ref 26.0–34.0)
MCHC: 31.4 g/dL (ref 30.0–36.0)
MCV: 98.6 fL (ref 78.0–100.0)
Monocytes Absolute: 0.7 10*3/uL (ref 0.1–1.0)
Monocytes Relative: 9 %
NEUTROS PCT: 70 %
Neutro Abs: 5.3 10*3/uL (ref 1.7–7.7)
PLATELETS: 147 10*3/uL — AB (ref 150–400)
RBC: 2.91 MIL/uL — AB (ref 3.87–5.11)
RDW: 19.1 % — ABNORMAL HIGH (ref 11.5–15.5)
WBC: 7.6 10*3/uL (ref 4.0–10.5)

## 2016-12-22 LAB — COMPREHENSIVE METABOLIC PANEL
ALBUMIN: 3.3 g/dL — AB (ref 3.5–5.0)
ALK PHOS: 162 U/L — AB (ref 38–126)
ALT: 16 U/L (ref 14–54)
ANION GAP: 11 (ref 5–15)
AST: 46 U/L — ABNORMAL HIGH (ref 15–41)
BILIRUBIN TOTAL: 2.4 mg/dL — AB (ref 0.3–1.2)
BUN: 14 mg/dL (ref 6–20)
CALCIUM: 8.9 mg/dL (ref 8.9–10.3)
CO2: 31 mmol/L (ref 22–32)
CREATININE: 2.69 mg/dL — AB (ref 0.44–1.00)
Chloride: 96 mmol/L — ABNORMAL LOW (ref 101–111)
GFR calc Af Amer: 24 mL/min — ABNORMAL LOW (ref >60)
GFR calc non Af Amer: 21 mL/min — ABNORMAL LOW (ref >60)
GLUCOSE: 88 mg/dL (ref 65–99)
Potassium: 4.2 mmol/L (ref 3.5–5.1)
SODIUM: 138 mmol/L (ref 135–145)
TOTAL PROTEIN: 6.7 g/dL (ref 6.5–8.1)

## 2016-12-22 LAB — I-STAT CG4 LACTIC ACID, ED: LACTIC ACID, VENOUS: 2.9 mmol/L — AB (ref 0.5–1.9)

## 2016-12-22 LAB — AMMONIA: Ammonia: 41 umol/L — ABNORMAL HIGH (ref 9–35)

## 2016-12-22 MED ORDER — DARBEPOETIN ALFA 60 MCG/0.3ML IJ SOSY
60.0000 ug | PREFILLED_SYRINGE | INTRAMUSCULAR | Status: DC
  Filled 2016-12-22: qty 0.3

## 2016-12-22 MED ORDER — LACTULOSE 10 GM/15ML PO SOLN: 10.0000 g | Freq: Three times a day (TID) | ORAL | Status: DC

## 2016-12-22 MED ORDER — LORAZEPAM 1 MG PO TABS
1.0000 mg | ORAL_TABLET | Freq: Two times a day (BID) | ORAL | Status: DC | PRN
  Administered 2016-12-23: 1 mg via ORAL
  Filled 2016-12-22: qty 1

## 2016-12-22 MED ORDER — FENTANYL CITRATE (PF) 100 MCG/2ML IJ SOLN
25.0000 ug | Freq: Once | INTRAMUSCULAR | Status: AC
  Administered 2016-12-22: 25 ug via INTRAVENOUS
  Filled 2016-12-22: qty 2

## 2016-12-22 MED ORDER — ONDANSETRON HCL 4 MG PO TABS: 4.0000 mg | ORAL_TABLET | Freq: Four times a day (QID) | ORAL | Status: DC | PRN

## 2016-12-22 MED ORDER — ONDANSETRON HCL 4 MG/2ML IJ SOLN: 4.0000 mg | Freq: Four times a day (QID) | INTRAMUSCULAR | Status: DC | PRN

## 2016-12-22 MED ORDER — SEVELAMER CARBONATE 800 MG PO TABS
1600.0000 mg | ORAL_TABLET | Freq: Three times a day (TID) | ORAL | Status: DC
  Administered 2016-12-24: 1600 mg via ORAL
  Filled 2016-12-22 (×4): qty 2

## 2016-12-22 MED ORDER — HEPARIN SODIUM (PORCINE) 5000 UNIT/ML IJ SOLN: 5000.0000 [IU] | Freq: Three times a day (TID) | INTRAMUSCULAR | Status: DC

## 2016-12-22 MED ORDER — PANTOPRAZOLE SODIUM 40 MG PO TBEC
40.0000 mg | DELAYED_RELEASE_TABLET | Freq: Every day | ORAL | Status: DC
  Filled 2016-12-22: qty 1

## 2016-12-22 MED ORDER — SENNOSIDES-DOCUSATE SODIUM 8.6-50 MG PO TABS: 1.0000 | ORAL_TABLET | Freq: Every evening | ORAL | Status: DC | PRN

## 2016-12-22 MED ORDER — MIDODRINE HCL 5 MG PO TABS
10.0000 mg | ORAL_TABLET | Freq: Three times a day (TID) | ORAL | Status: DC
Start: 2016-12-23 — End: 2016-12-23

## 2016-12-22 MED ORDER — SODIUM CHLORIDE 0.9 % IV SOLN
INTRAVENOUS | Status: DC
  Administered 2016-12-22: 1000 mL via INTRAVENOUS

## 2016-12-22 MED ORDER — LIDOCAINE VISCOUS 2 % MT SOLN
15.0000 mL | Freq: Once | OROMUCOSAL | Status: AC
  Administered 2016-12-22: 15 mL via OROMUCOSAL
  Filled 2016-12-22: qty 15

## 2016-12-22 MED ORDER — ORAL CARE MOUTH RINSE
15.0000 mL | Freq: Two times a day (BID) | OROMUCOSAL | Status: DC
  Administered 2016-12-22: 15 mL via OROMUCOSAL

## 2016-12-22 MED ORDER — SUCRALFATE 1 GM/10ML PO SUSP
1.0000 g | Freq: Three times a day (TID) | ORAL | Status: DC
  Administered 2016-12-22 – 2016-12-24 (×7): 1 g via ORAL
  Filled 2016-12-22 (×7): qty 10

## 2016-12-22 MED ORDER — SODIUM CHLORIDE 0.9% FLUSH
3.0000 mL | Freq: Two times a day (BID) | INTRAVENOUS | Status: DC
  Administered 2016-12-22 – 2016-12-24 (×4): 3 mL via INTRAVENOUS

## 2016-12-22 MED ORDER — OXYCODONE HCL 5 MG PO TABS
5.0000 mg | ORAL_TABLET | Freq: Three times a day (TID) | ORAL | Status: DC | PRN
  Administered 2016-12-22: 5 mg via ORAL
  Filled 2016-12-22: qty 1

## 2016-12-22 NOTE — ED Provider Notes (Signed)
[facility] DEPT Provider Note   CSN: [id number] Arrival date & time: 12/22/16  1700     History   Chief Complaint Chief Complaint  Patient presents with  . Leg Swelling    blisters on legs    HPI Wanda [name] is a 41 y.o. female.  HPI  The patient is a 41 year old female, she is known to have a history of end-stage renal disease as well as cirrhosis, she has been admitted to the hospital not infrequently for fluid overload, she has a long history of not being compliant with her dialysis, she also has a history of anasarca. She has not been compliant with her lactulose either. She was most recently admitted to the hospital on 12/13/2016 when she presented with pulmonary edema and fluid overload and anasarca.  The medical record to suggest that the patient had been dialyzed to remove what appeared to be a significant amount of fluid overload. She was discharged yesterday, she followed up with dialysis today and at that time had a full dialysis session. The patient and her significant other decided to come to the hospital because she feels as though she is becoming more swollen and now has diffuse blistering on her bilateral lower extremities which is new. She states that her legs have become darkened in color, they both burn and hurt and she is having significant pain and burning sensation in her bilateral legs. These blisters occurred overnight, she has never had them before.  The symptoms have been persistent, gradual in onset, gradually worsening and is now become severe. She denies associated fevers  Past Medical History:  Diagnosis Date  . Acute blood loss anemia 02/25/2014   Status post transfusion  . Acute renal failure ([place]) 09/2013   Pre-renal- resolved  . Anasarca 10/10/2013  . Anxiety   . Bipolar disorder ([place]) 12/04/2013   2007-SEEN IN ED FOR INVOLUNTARY COMMITMENT, UDS POS FOR AMPHETAMINES/OPIATES   . Bleeding esophageal varices ([place]) 02/28/2014   s/p banding  . C.  difficile colitis 04/19/2014  . Chronic hypotension   . Cirrhosis ([place]) 10/05/13   Liver bx 11/23/13 (delayed initially due to patient refusal). c/w steatohepatitis  . Cirrhosis of liver with ascites ([place])   . Depression   . ESRD (end stage renal disease) on dialysis ([place]) 08/2014  . Folate deficiency 09/2013  . Gastroesophageal junction ulcer 09/17/2014  . GERD (gastroesophageal reflux disease)   . Hematemesis/vomiting blood 02/24/2014  . Macrocytosis 02/28/2014  . PNA (pneumonia) 10/13/2013  . SBP (spontaneous bacterial peritonitis) ([place]) 11/10/2013  . Thrombocytopenia ([place])    Hypercellular bone marrow; abundant megakaryocytes per 08/27/2014; s/p bone marrow bx    Patient Active Problem List   Diagnosis Date Noted  . ESRD (end stage renal disease) ([place]) 12/13/2016  . Volume overload 12/13/2016  . History of noncompliance with medical treatment   . Liver cirrhosis secondary to NASH (nonalcoholic steatohepatitis) ([place])   . Palliative care encounter   . Pulmonary edema 11/20/2016  . Hypocalcemia 11/20/2016  . Cardiomyopathy- ? ischemic  10/09/2016  . Troponin level elevated 10/09/2016  . Goals of care, counseling/discussion   . Palliative care by specialist   . Seizure ([place]) 10/07/2016  . Pressure injury of skin 05/20/2016  . Shock ([place]) 05/19/2016  . Rhonchi   . Chronic pain syndrome 08/22/2015  . Abdominal pain 08/07/2015  . Umbilical hernia without obstruction and without gangrene 08/07/2015  . GI bleed   . Acute blood loss anemia   . Hypotension 06/05/2015  .  Edema of left lower extremity 02/22/2015  . Ascites of liver   . History of esophageal varices with bleeding 09/16/2014  . End stage renal disease on dialysis ([place])   . Anemia of chronic disease   . Thrombocytopenia ([place]) 08/19/2014  . Hyponatremia 08/17/2014  . Esophageal varices without bleeding ([place])   . Portal hypertensive gastropathy ([place])   . Malnutrition of moderate degree ([place]) 05/15/2014  . Bipolar disorder  ([place]) 12/04/2013  . SBP (spontaneous bacterial peritonitis) ([place]) 11/10/2013  . Liver cirrhosis secondary to nonalcoholic steatohepatitis (NASH) ([place]) 11/07/2013  . Folate deficiency 10/13/2013  . Anasarca 10/10/2013  . Anemia 10/06/2013    Past Surgical History:  Procedure Laterality Date  . AV FISTULA PLACEMENT Right 11/16/2014   Procedure: Right arm Creation of arteriovenous fistula;  Surgeon: Angelia Mould, MD;  Location: [facility];  Service: Vascular;  Laterality: Right;  . CENTRAL VENOUS CATHETER INSERTION Right   . COLONOSCOPY N/A 12/19/2013   SLF:NO OBVIOUS SOURCE FOR ANEMIA IDETIFIED/ONE COLON POLYP REMOVED/Small internal hemorrhoids  . ESOPHAGEAL BANDING  07/04/2014   Procedure: ESOPHAGEAL BANDING;  Surgeon: Daneil Dolin, MD;  [facility];  Service: Endoscopy;;  . ESOPHAGEAL BANDING N/A 07/24/2014   Procedure: ESOPHAGEAL BANDING (2 bands applied);  Surgeon: Danie Binder, MD;  Location: [facility];  Service: Endoscopy;  Laterality: N/A;  . ESOPHAGEAL BANDING N/A 07/23/2015   Procedure: ESOPHAGEAL BANDING;  Surgeon: Danie Binder, MD;  [facility];  Service: Endoscopy;  Laterality: N/A;  . ESOPHAGEAL BANDING N/A 03/04/2016   Procedure: ESOPHAGEAL BANDING;  Surgeon: Danie Binder, MD;  [facility];  Service: Endoscopy;  Laterality: N/A;  . ESOPHAGEAL BANDING N/A 04/30/2016   Procedure: ESOPHAGEAL BANDING;  Surgeon: Daneil Dolin, MD;  [facility];  Service: Endoscopy;  Laterality: N/A;  . ESOPHAGOGASTRODUODENOSCOPY N/A 11/14/2013   SLF:1 column of very small varices in distal esopahgus/MODERATE PORTAL GASTROPATHY IN PROXIMAL STOMACH/MODERATE erosive gastritis  . ESOPHAGOGASTRODUODENOSCOPY N/A 02/11/2014   Dr. Rourk:Esophageal varices with bleeding stigmata-status post esophageal band ligation therapy. Portal gastropathy  . ESOPHAGOGASTRODUODENOSCOPY N/A 07/04/2014   RMR: Persiting grade 2 esophageal varicies with bleeding stigmata  status post band ligation. Significantly congested gastric mucosa iwith changes constistant with protal gastropathy.   . ESOPHAGOGASTRODUODENOSCOPY N/A 09/16/2014   Rehman: Single short column of varix proximal to GE junction not large enough to be banded. Two amall ulcers at the GEJ felt to be source of GI Bleeding but no active bleeding but no actibe bleeding noted. No therapy rendered. Portal gastropathy NO evidence of peptic ulcer diease or gastric varices.   . ESOPHAGOGASTRODUODENOSCOPY N/A 12/14/2014   SLF: Grade ! esophageal varices. 2. Moderate Portal Gastropathy  . ESOPHAGOGASTRODUODENOSCOPY N/A 03/27/2015   Procedure: ESOPHAGOGASTRODUODENOSCOPY (EGD);  Surgeon: Danie Binder, MD;  [facility];  Service: Endoscopy;  Laterality: N/A;  1045am - moved to 817 @ 11:30  . ESOPHAGOGASTRODUODENOSCOPY N/A 06/05/2015   Procedure: ESOPHAGOGASTRODUODENOSCOPY (EGD);  Surgeon: Clarene Essex, MD;  [facility] END[facility]COPY;  Service: Endoscopy;  Laterality: N/A;  . ESOPHAGOGASTRODUODENOSCOPY N/A 07/23/2015   SLF:1. Grade 1 esophageal varices 2. Moderate portal hypertensive gastropathy 3. MILd non-erosive gastritis.   [name] ESOPHAGOGASTRODUODENOSCOPY N/A 03/04/2016   Dr. Oneida Alar: grade 1 varices, surveillance in Jan 2017   . ESOPHAGOGASTRODUODENOSCOPY N/A 05/21/2016   Dr. Carlean Purl: non-bleeding esophageal ulcer, likely secondary to MW tear  . ESOPHAGOGASTRODUODENOSCOPY (EGD) WITH PROPOFOL N/A 07/24/2014   SLF:  1. 2 columns grade 2-3  varices- 2 Bands applied.  2.  Moderate gastropathy 3. Duodenal Diverticula  . ESOPHAGOGASTRODUODENOSCOPY (EGD) WITH PROPOFOL N/A 10/26/2014   RMR: 2 columns of grade 2 esophageal varices without obvious bleeding stigmata status post band ligation to complet obliteration of remaining varices.   . ESOPHAGOGASTRODUODENOSCOPY (EGD) WITH PROPOFOL N/A 04/30/2016   Dr. Gala Romney: MW tear, varices obliterated, portal gastropathy  . None    . PARACENTESIS  Feb 2015   1180 fluid, negative  fluid analysis.   [name] PARACENTESIS  10/2013    OB History    Gravida Para Term Preterm AB Living   1 1 1     1    SAB TAB Ectopic Multiple Live Births                   Home Medications    Prior to Admission medications   Medication Sig Start Date End Date Taking? Authorizing Provider  clotrimazole (LOTRIMIN) 1 % cream Apply 1 application topically 3 (three) times daily. 09/30/16   Fields, Marga Melnick, MD  Darbepoetin Alfa (ARANESP) 60 MCG/0.3ML SOSY injection Inject 0.3 mLs (60 mcg total) into the vein every Thursday with hemodialysis. Patient taking differently: Inject 60 mcg into the vein every Tuesday with hemodialysis.  06/09/15   Cherene Altes, MD  lactulose (CHRONULAC) 10 GM/15ML solution Take 15-30 mLs (10-20 g total) by mouth 3 (three) times daily. TAKE 15-30 ml BY MOUTH three times a day 12/21/16   Samuella Cota, MD  lidocaine (XYLOCAINE) 2 % solution TAKE TWO TEASPOONSFUL (10ML) BY MOUTH BEFORE MEALS AND AT BEDTIME TO PREVENT CHEST PAIN WHILE EATING 03/19/16   Carlis Stable, NP  lidocaine-prilocaine (EMLA) cream Apply 1 application topically as needed (for dialysis treatments).    [provider]  LORazepam (ATIVAN) 1 MG tablet Take 1 mg by mouth 2 (two) times daily as needed for anxiety (and/or back spasms).    [provider]  midodrine (PROAMATINE) 10 MG tablet Take 1 tablet (10 mg total) by mouth 3 (three) times daily. 12/21/16   Samuella Cota, MD  omeprazole (PRILOSEC) 20 MG capsule 1 po every morning 30 minutes prior to your first meal. Patient taking differently: Take 20 mg by mouth daily before breakfast. 1 po every morning 30 minutes prior to your first meal. 09/30/16   Fields, Marga Melnick, MD  Oxycodone HCl 10 MG TABS Take 0.5 tablets (5 mg total) by mouth 3 (three) times daily as needed. 1 PO TID AS NEEDED FOR PAIN Patient taking differently: Take 5-10 mg by mouth 3 (three) times daily as needed.  09/27/16   Cherene Altes, MD  RENVELA 800 MG  tablet Take 1600 mg by mouth 3 times daily with meals. Take 800 mg by mouth with snacks. 10/29/15   [provider]  sucralfate (CARAFATE) 1 GM/10ML suspension Take 1 g by mouth 4 (four) times daily -  with meals and at bedtime.    [provider]    Family History Family History  Problem Relation Age of Onset  . Heart disease Mother   . Colon cancer Neg Hx   . Liver disease Neg Hx     Social History Social History  Substance Use Topics  . Smoking status: Former Smoker    Packs/day: 0.25    Years: 20.00    Types: Cigarettes    Quit date: 11/05/2008  . Smokeless tobacco: Never Used     Comment: Never really smoked much  . Alcohol use No  Allergies   Iohexol; Lasix [furosemide]; and Latex   Review of Systems Review of Systems  All other systems reviewed and are negative.    Physical Exam Updated Vital Signs BP (!) 100/39   Pulse 93   Temp 98.6 F (37 C) (Oral)   Resp (!) 22   Wt 161 lb 6 oz (73.2 kg)   LMP 09/21/2013   SpO2 100%   BMI 30.49 kg/m   Physical Exam  Constitutional: She appears well-developed and well-nourished.  Uncomfortable appearing  HENT:  Head: Normocephalic and atraumatic.  Mouth/Throat: Oropharynx is clear and moist. No oropharyngeal exudate.  Eyes: EOM are normal. Pupils are equal, round, and reactive to light. Right eye exhibits no discharge. Left eye exhibits no discharge. Scleral icterus is present.  Neck: Normal range of motion. Neck supple. No JVD present. No thyromegaly present.  Cardiovascular: Normal rate, regular rhythm, normal heart sounds and intact distal pulses.  Exam reveals no gallop and no friction rub.   No murmur heard. Pulmonary/Chest: Effort normal and breath sounds normal. No respiratory distress. She has no wheezes. She has no rales.  Abdominal: Bowel sounds are normal. She exhibits distension. She exhibits no mass. There is no tenderness.  Anasarca to the level of the umbilicus, distention  without significant tenderness. No tympanitic sounds to percussion, it is dull with a slight fluid wave  Musculoskeletal: She exhibits edema and tenderness.  Masses bilateral lower extremity pitting edema and anasarca extending the entire length of the legs up onto the lower abdominal wall. There is a darkening of the skin which is a reddish hue, vesicular and bullous lesions covering the lower extremities below the knees, no purulence  Lymphadenopathy:    She has no cervical adenopathy.  Neurological: She is alert. Coordination normal.  Skin: Skin is warm and dry. Rash noted. There is erythema.  Psychiatric: She has a normal mood and affect. Her behavior is normal.  Nursing note and vitals reviewed.    ED Treatments / Results  Labs (all labs ordered are listed, but only abnormal results are displayed) Labs Reviewed  COMPREHENSIVE METABOLIC PANEL - Abnormal; Notable for the following:       Result Value   Chloride 96 (*)    Creatinine, Ser 2.69 (*)    Albumin 3.3 (*)    AST 46 (*)    Alkaline Phosphatase 162 (*)    Total Bilirubin 2.4 (*)    GFR calc non Af Amer 21 (*)    GFR calc Af Amer 24 (*)    All other components within normal limits  CBC WITH DIFFERENTIAL/PLATELET - Abnormal; Notable for the following:    RBC 2.91 (*)    Hemoglobin 9.0 (*)    HCT 28.7 (*)    RDW 19.1 (*)    Platelets 147 (*)    All other components within normal limits  AMMONIA - Abnormal; Notable for the following:    Ammonia 41 (*)    All other components within normal limits  I-STAT CG4 LACTIC ACID, ED - Abnormal; Notable for the following:    Lactic Acid, Venous 2.90 (*)    All other components within normal limits  AEROBIC CULTURE (SUPERFICIAL SPECIMEN)    Radiology No results found.  Procedures Procedures (including critical care time)  Medications Ordered in ED Medications  0.9 %  sodium chloride infusion (1,000 mLs Intravenous New Bag/Given 12/22/16 1915)    Initial Impression /  Assessment and Plan / ED Course  I have reviewed the  triage vital signs and the nursing notes.  Pertinent labs & imaging results that were available during my care of the patient were reviewed by me and considered in my medical decision making (see chart for details).      The patient has a significantly abnormal exam though I suspect that she has significant edema at baseline, the bulla formation most likely is secondary to her significant anasarca and fluid overload, I do not see any mental status concerns, she does not have any pulmonary dysfunction, she has recently both had paracentesis and dialysis and has had a large volume of fluid removed. That being said she still has significant pain and swelling in her legs and now has some discoloration of the skin. She is not been started on any new medications to suggest that this is Kathreen Cosier, this could be related to developing infection with a darkening color of her legs though it does appear more to be a hyperpigmentation. Anticipate that the patient will need to be admitted due to this rapid worsening of her bullous condition.  Labs with normal WBC Anemia slightly better Renal function and liver function poor but unchanged BP low but c/w prior not much from baseline Fluids, d/w hospitalist for admission for new bullae disease.   Final Clinical Impressions(s) / ED Diagnoses   Final diagnoses:  Bullae  Anasarca    New Prescriptions New Prescriptions   No medications on file     Noemi Chapel, MD 12/22/16 [phone]

## 2016-12-22 NOTE — H&P (Signed)
History and Physical    Wanda [name] IFO:[id number] DOB: [birth year] DOA: 12/22/2016  PCP: [name], MD   Patient coming from: Home  Chief Complaint: Bilat lower extremity blisters  HPI: Wanda [name] is a 41 y.o. female with medical history significant of end-stage renal disease on HD, cirrhosis secondary to NASH, history of noncompliance and multiple hospital admissions, last discharge from the hospital yesterday 4/14, presented today with complaints of blisters suddenly developing on bilateral lower extremities last night, after she got home from the hospital. Blisters associated with pain, increased redness. Patient's significant other was at bedside states patient has had small blisters over the past week, but the patient states she was not aware.  Patient denies blisters or rash on any other parts of her body, no fever. Denies similar skin condition on family members. Has a cough sometimes productive, endorses abdominal distention, with shortness of breath. Denies chest pain. Upon completion of HD session today, patient was given 1.5 g of vancomycin.  12/08/16- patient was seen in the ED for groin pain, UA was suggestive of infection, patient was sent to home on Keflex, she took 4 of 6 doses, and was called to discontinue the medication as urine culture results grew Citrobacter resistant to cefazolin, patient was subsequently admitted for fluid overload and treated with ciprofloxacin. No NSAID use, or other new medications. Patient was just discharged from the hospital for 11/21/16- after an 8 day hospital stay, she was admitted and managed for massive volume overload secondary to noncompliance with HD complicated by underlying cirrhosis.   ED Course: BP soft-but this is not unusual for patient, white blood count 7.6, lactic acid 2.9, patient was given 250 mL of N/s. Hospitalist was called to admit for possible cellulitis, concern for worsening bullous condition of bilat lower  extremities.  Review of Systems: CONSTITUTIONAL- No Fever, weightloss, night sweat or change in appetite. HEAD- Mild Headache, no dizziness. CARDIAC- No Palpitations or chest pain. GI- No nausea, vomiting, diarrhoea, constipation, abd pain. URINARY- No Frequency, urgency, straining or dysuria, she makes a small amount of urine. NEUROLOGIC- No Numbness, syncope, seizures or burning. [facility]- Denies depression or anxiety.  Past Medical History:  Diagnosis Date  . Acute blood loss anemia 02/25/2014   Status post transfusion  . Acute renal failure ([place]) 09/2013   Pre-renal- resolved  . Anasarca 10/10/2013  . Anxiety   . Bipolar disorder ([place]) 12/04/2013   2007-SEEN IN ED FOR INVOLUNTARY COMMITMENT, UDS POS FOR AMPHETAMINES/OPIATES   . Bleeding esophageal varices ([place]) 02/28/2014   s/p banding  . C. difficile colitis 04/19/2014  . Chronic hypotension   . Cirrhosis ([place]) 10/05/13   Liver bx 11/23/13 (delayed initially due to patient refusal). c/w steatohepatitis  . Cirrhosis of liver with ascites ([place])   . Depression   . ESRD (end stage renal disease) on dialysis ([place]) 08/2014  . Folate deficiency 09/2013  . Gastroesophageal junction ulcer 09/17/2014  . GERD (gastroesophageal reflux disease)   . Hematemesis/vomiting blood 02/24/2014  . Macrocytosis 02/28/2014  . PNA (pneumonia) 10/13/2013  . SBP (spontaneous bacterial peritonitis) ([place]) 11/10/2013  . Thrombocytopenia ([place])    Hypercellular bone marrow; abundant megakaryocytes per 08/27/2014; s/p bone marrow bx    Past Surgical History:  Procedure Laterality Date  . AV FISTULA PLACEMENT Right 11/16/2014   Procedure: Right arm Creation of arteriovenous fistula;  Surgeon: Angelia Mould, MD;  Location: [place];  Service: Vascular;  Laterality: Right;  . CENTRAL VENOUS CATHETER INSERTION Right   .  COLONOSCOPY N/A 12/19/2013   SLF:NO OBVIOUS SOURCE FOR ANEMIA IDETIFIED/ONE COLON POLYP REMOVED/Small internal hemorrhoids  . ESOPHAGEAL BANDING   07/04/2014   Procedure: ESOPHAGEAL BANDING;  Surgeon: Daneil Dolin, MD;  [facility];  Service: Endoscopy;;  . ESOPHAGEAL BANDING N/A 07/24/2014   Procedure: ESOPHAGEAL BANDING (2 bands applied);  Surgeon: Danie Binder, MD;  Location: [facility];  Service: Endoscopy;  Laterality: N/A;  . ESOPHAGEAL BANDING N/A 07/23/2015   Procedure: ESOPHAGEAL BANDING;  Surgeon: Danie Binder, MD;  [facility];  Service: Endoscopy;  Laterality: N/A;  . ESOPHAGEAL BANDING N/A 03/04/2016   Procedure: ESOPHAGEAL BANDING;  Surgeon: Danie Binder, MD;  [facility];  Service: Endoscopy;  Laterality: N/A;  . ESOPHAGEAL BANDING N/A 04/30/2016   Procedure: ESOPHAGEAL BANDING;  Surgeon: Daneil Dolin, MD;  [facility];  Service: Endoscopy;  Laterality: N/A;  . ESOPHAGOGASTRODUODENOSCOPY N/A 11/14/2013   SLF:1 column of very small varices in distal esopahgus/MODERATE PORTAL GASTROPATHY IN PROXIMAL STOMACH/MODERATE erosive gastritis  . ESOPHAGOGASTRODUODENOSCOPY N/A 02/11/2014   Dr. Rourk:Esophageal varices with bleeding stigmata-status post esophageal band ligation therapy. Portal gastropathy  . ESOPHAGOGASTRODUODENOSCOPY N/A 07/04/2014   RMR: Persiting grade 2 esophageal varicies with bleeding stigmata status post band ligation. Significantly congested gastric mucosa iwith changes constistant with protal gastropathy.   . ESOPHAGOGASTRODUODENOSCOPY N/A 09/16/2014   Rehman: Single short column of varix proximal to GE junction not large enough to be banded. Two amall ulcers at the GEJ felt to be source of GI Bleeding but no active bleeding but no actibe bleeding noted. No therapy rendered. Portal gastropathy NO evidence of peptic ulcer diease or gastric varices.   . ESOPHAGOGASTRODUODENOSCOPY N/A 12/14/2014   SLF: Grade ! esophageal varices. 2. Moderate Portal Gastropathy  . ESOPHAGOGASTRODUODENOSCOPY N/A 03/27/2015   Procedure: ESOPHAGOGASTRODUODENOSCOPY (EGD);  Surgeon: Danie Binder, MD;  [facility];  Service: Endoscopy;  Laterality: N/A;  1045am - moved to 817 @ 11:30  . ESOPHAGOGASTRODUODENOSCOPY N/A 06/05/2015   Procedure: ESOPHAGOGASTRODUODENOSCOPY (EGD);  Surgeon: Clarene Essex, MD;  [facility];  Service: Endoscopy;  Laterality: N/A;  . ESOPHAGOGASTRODUODENOSCOPY N/A 07/23/2015   SLF:1. Grade 1 esophageal varices 2. Moderate portal hypertensive gastropathy 3. MILd non-erosive gastritis.   [name] ESOPHAGOGASTRODUODENOSCOPY N/A 03/04/2016   Dr. Oneida Alar: grade 1 varices, surveillance in Jan 2017   . ESOPHAGOGASTRODUODENOSCOPY N/A 05/21/2016   Dr. Carlean Purl: non-bleeding esophageal ulcer, likely secondary to MW tear  . ESOPHAGOGASTRODUODENOSCOPY (EGD) WITH PROPOFOL N/A 07/24/2014   SLF:  1. 2 columns grade 2-3 varices- 2 Bands applied.  2.  Moderate gastropathy 3. Duodenal Diverticula  . ESOPHAGOGASTRODUODENOSCOPY (EGD) WITH PROPOFOL N/A 10/26/2014   RMR: 2 columns of grade 2 esophageal varices without obvious bleeding stigmata status post band ligation to complet obliteration of remaining varices.   . ESOPHAGOGASTRODUODENOSCOPY (EGD) WITH PROPOFOL N/A 04/30/2016   Dr. Gala Romney: MW tear, varices obliterated, portal gastropathy  . None    . PARACENTESIS  Feb 2015   1180 fluid, negative fluid analysis.   [name] PARACENTESIS  10/2013     reports that she quit smoking about 8 years ago. Her smoking use included Cigarettes. She has a 5.00 pack-year smoking history. She has never used smokeless tobacco. She reports that she does not drink alcohol or use drugs.  Allergies  Allergen Reactions  . Iohexol Rash    Pt states rash after last CT. Pt needs pre-meds  . Lasix [Furosemide] Other (See Comments)    "  doesn't work"  . Latex Itching    Family History  Problem Relation Age of Onset  . Heart disease Mother   . Colon cancer Neg Hx   . Liver disease Neg Hx    Prior to Admission medications   Medication Sig Start Date End Date Taking? Authorizing Provider    clotrimazole (LOTRIMIN) 1 % cream Apply 1 application topically 3 (three) times daily. Patient taking differently: Apply 1 application topically 3 (three) times daily as needed.  09/30/16  Yes Fields, Marga Melnick, MD  Darbepoetin Alfa (ARANESP) 60 MCG/0.3ML SOSY injection Inject 0.3 mLs (60 mcg total) into the vein every Thursday with hemodialysis. Patient taking differently: Inject 60 mcg into the vein every Tuesday with hemodialysis.  06/09/15  Yes Cherene Altes, MD  lactulose (CHRONULAC) 10 GM/15ML solution Take 15-30 mLs (10-20 g total) by mouth 3 (three) times daily. TAKE 15-30 ml BY MOUTH three times a day 12/21/16  Yes Samuella Cota, MD  lidocaine (XYLOCAINE) 2 % solution TAKE TWO TEASPOONSFUL (10ML) BY MOUTH BEFORE MEALS AND AT BEDTIME TO PREVENT CHEST PAIN WHILE EATING 03/19/16  Yes Carlis Stable, NP  lidocaine-prilocaine (EMLA) cream Apply 1 application topically as needed (for dialysis treatments).   Yes [provider]  LORazepam (ATIVAN) 1 MG tablet Take 1 mg by mouth 2 (two) times daily as needed for anxiety (and/or back spasms).   Yes [provider]  midodrine (PROAMATINE) 10 MG tablet Take 1 tablet (10 mg total) by mouth 3 (three) times daily. 12/21/16  Yes Samuella Cota, MD  omeprazole (PRILOSEC) 20 MG capsule 1 po every morning 30 minutes prior to your first meal. Patient taking differently: Take 20 mg by mouth daily before breakfast. 1 po every morning 30 minutes prior to your first meal. 09/30/16  Yes Fields, Marga Melnick, MD  Oxycodone HCl 10 MG TABS Take 0.5 tablets (5 mg total) by mouth 3 (three) times daily as needed. 1 PO TID AS NEEDED FOR PAIN Patient taking differently: Take 5-10 mg by mouth 3 (three) times daily as needed.  09/27/16  Yes Cherene Altes, MD  RENVELA 800 MG tablet Take 1600 mg by mouth 3 times daily with meals. Take 800 mg by mouth with snacks. 10/29/15  Yes [provider]  sucralfate (CARAFATE) 1 GM/10ML suspension Take 1 g by  mouth 4 (four) times daily -  with meals and at bedtime.   Yes [provider]    Physical Exam: Vitals:   12/22/16 1728 12/22/16 1755 12/22/16 1900 12/22/16 1930  BP:  (!) 96/46 (!) 100/39 (!) 97/38  Pulse:  (!) 101 93 91  Resp:   (!) 22 (!) 27  Temp:      TempSrc:      SpO2:  100% 100% 100%  Weight: 73.2 kg (161 lb 6 oz)       Constitutional: NAD, calm, comfortable Vitals:   12/22/16 1728 12/22/16 1755 12/22/16 1900 12/22/16 1930  BP:  (!) 96/46 (!) 100/39 (!) 97/38  Pulse:  (!) 101 93 91  Resp:   (!) 22 (!) 27  Temp:      TempSrc:      SpO2:  100% 100% 100%  Weight: 73.2 kg (161 lb 6 oz)      Eyes: PERRL, lids and conjunctivae normal ENMT: Mucous membranes are moist. Posterior pharynx clear of any exudate or lesions. Neck: normal, supple, no masses, no thyromegaly Respiratory: Crackles right lung base, no wheeze Normal respiratory effort. No  accessory muscle use.  Cardiovascular: Regular rate and rhythm, no murmurs / rubs / gallops. Bilat pedal edema TO lower abdomen. Abdomen: Abdomen markedly distended with striae, umbilical tenderness,  Musculoskeletal: no clubbing / cyanosis. No joint deformity upper and lower extremities. Good ROM, no contractures. Normal muscle tone.  Skin: Bilateral lower extremities with woody induration, with erythema, no differential warmth, significant flaccid appearing bullous lesions, various sizes, between the lower 1/3 to upper 1/3 of both Legs, between ankle and knees, largest Left -8 by 8cm, Right- 6 by 5cm,  Neurologic: CN 2-12 grossly intact. Sensation intact, DTR normal. Strength 5/5 in all 4.  Psychiatric: Normal judgment and insight. Alert and oriented x 3. Normal mood.   Labs on Admission: I have personally reviewed following labs and imaging studies  CBC:  Recent Labs Lab 12/16/16 0916 12/18/16 1205 12/21/16 1141 12/22/16 1735  WBC 7.4 8.1 6.9 7.6  NEUTROABS  --   --   --  5.3  HGB 7.4* 7.7* 7.6* 9.0*  HCT 23.4*  24.7* 25.2* 28.7*  MCV 98.3 99.2 98.4 98.6  PLT 159 154 144* 562*   Basic Metabolic Panel:  Recent Labs Lab 12/16/16 0916 12/18/16 1205 12/21/16 1141 12/22/16 1735  NA 134* 132* 135 138  K 4.2 4.1 4.6 4.2  CL 100* 96* 99* 96*  CO2 26 25 24 31   GLUCOSE 82 94 86 88  BUN 41* 46* 54* 14  CREATININE 6.02* 6.90* 7.25* 2.69*  CALCIUM 8.4* 8.9 9.4 8.9  PHOS 3.7 4.8* 4.5  --    GFR: Estimated Creatinine Clearance: 24.9 mL/min (A) (by C-G formula based on SCr of 2.69 mg/dL (H)). Liver Function Tests:  Recent Labs Lab 12/16/16 0916 12/18/16 1205 12/21/16 1141 12/22/16 1735  AST  --   --   --  46*  ALT  --   --   --  16  ALKPHOS  --   --   --  162*  BILITOT  --   --   --  2.4*  PROT  --   --   --  6.7  ALBUMIN 2.5* 2.4* 2.6* 3.3*    Recent Labs Lab 12/22/16 1738  AMMONIA 41*   Radiological Exams on Admission: No results found.  EKG: None  Assessment/Plan Principal Problem:   Bullous skin disease Active Problems:   Anasarca   Liver cirrhosis secondary to nonalcoholic steatohepatitis (NASH) (HCC)   Portal hypertensive gastropathy (HCC)   Thrombocytopenia (HCC)   End stage renal disease on dialysis (HCC)   Chronic pain syndrome   Cardiomyopathy- ? ischemic   Bullous skin disease- differentials- 1- adverse drug reaction to cephalosporins-keflex  Vs 2.  Pemphigus vulgaris Vs 3. Bullous pemphigoid Vs 4. cellulitis or infection related Vs 5. other autoimmune cutaneous diseases. WBC- 7.6, lactic acid 2.9- in the setting of ESRD. Doubt Nec Fas-with bilateral involvement at the same time, no crepitus, no fever and no leukocytosis, patient is not ill appearing. - Admits to obs, tele - IV vancomycin 1.5 mg given today - Patient absolutely refuses to receive any subsequent doses of vancomycin for fear of C. difficile infection which she has had in the past, though I explained this antibiotic is less likely to cause this as the PO form is used for treatment. She was not aware  this was the antibiotic given at HD. -Bullous lesions could get secondary bacterial infection, Consider starting linezolid tomorrow, will need ID approval.  - would Avoid cephalosporins and penicillins considering these can cause bullous skin  lesions  - trend lactic acid, will hold off on IV fluids considering anasarca, HD status. - Surgery consult for skin biopsy - Bilateral lower extremity x-rays check for gas in tissue. - Blood cultures 2 - Wound culture obtained in ED - CBC am  Anarsarca- with shortness of breath likely 2/2 ESRD complicated by underlying cirrhosis, and prior history of noncompliance. - Nephrology consult. - SOB- likely 2/2 pulmonary edema and significant abdominal distention compromising diaphragm. - Portable chest x-ray- underlying mild pulmonary edema  ESRD on HD- patient appears volume overloaded. Last HD session today. History of noncompliance with HD. - Nephrology consult. - Needs diuresis - Resume home Renvela, Aranesp  Cirrhosis secondary to NASH- with portal hypertension, chronic hypotension, mild thrombocytopenia- 147, volume overload. - Continue lactulose - Continue midodrine  Cardiomyopathy - echo  4/70- Systolic function was mildly reduced. EF- 45% to 50%. G1DD.  Chronic pain syndrome and anxiety-  - home medication oxycodone 10 mg 3 times a day, give reduced dose 5 mg with hypotension - Continue home Ativan PRN  DVT prophylaxis:  heparin  Code Status:  full  full Family Communication:  significant order Disposition Plan:  Home, refused  Consults called: nephrology, general surgery  Admission status: TELEMETRY, obs.  Bethena Roys MD [facility] Pager [phone]  If 11PM-7AM, please contact night-coverage [email or web address] Password [facility]  12/22/2016, 8:28 PM

## 2016-12-22 NOTE — ED Triage Notes (Signed)
Pt was recently hospitalized and was discharged yesterday.  Pt noticed leg swelling and blisters on BLE yesterday pm.  Today she had her dialysis treatment.  She was able to complete the entire treatmetment and had 1.5g Vanc there.  Pt came here due to swelling in BLE, abdominal distention and large fluid filled blisters on BLE.  Pt has a handwritten note from dialysis stating "vancomycin 1.5g IV given    Vanc 750mg  IV x64more doses ordered"

## 2016-12-23 DIAGNOSIS — N186 End stage renal disease: Secondary | ICD-10-CM | POA: Diagnosis not present

## 2016-12-23 DIAGNOSIS — K7581 Nonalcoholic steatohepatitis (NASH): Secondary | ICD-10-CM | POA: Diagnosis not present

## 2016-12-23 DIAGNOSIS — D696 Thrombocytopenia, unspecified: Secondary | ICD-10-CM

## 2016-12-23 DIAGNOSIS — K7460 Unspecified cirrhosis of liver: Secondary | ICD-10-CM

## 2016-12-23 DIAGNOSIS — R601 Generalized edema: Principal | ICD-10-CM

## 2016-12-23 DIAGNOSIS — L139 Bullous disorder, unspecified: Secondary | ICD-10-CM | POA: Diagnosis not present

## 2016-12-23 DIAGNOSIS — Z992 Dependence on renal dialysis: Secondary | ICD-10-CM

## 2016-12-23 LAB — CBC
HEMATOCRIT: 22.9 % — AB (ref 36.0–46.0)
Hemoglobin: 7 g/dL — ABNORMAL LOW (ref 12.0–15.0)
MCH: 30.4 pg (ref 26.0–34.0)
MCHC: 30.6 g/dL (ref 30.0–36.0)
MCV: 99.6 fL (ref 78.0–100.0)
PLATELETS: 132 10*3/uL — AB (ref 150–400)
RBC: 2.3 MIL/uL — AB (ref 3.87–5.11)
RDW: 19.1 % — ABNORMAL HIGH (ref 11.5–15.5)
WBC: 7 10*3/uL (ref 4.0–10.5)

## 2016-12-23 LAB — BASIC METABOLIC PANEL
Anion gap: 9 (ref 5–15)
BUN: 23 mg/dL — ABNORMAL HIGH (ref 6–20)
CHLORIDE: 96 mmol/L — AB (ref 101–111)
CO2: 30 mmol/L (ref 22–32)
CREATININE: 3.83 mg/dL — AB (ref 0.44–1.00)
Calcium: 8.5 mg/dL — ABNORMAL LOW (ref 8.9–10.3)
GFR calc non Af Amer: 14 mL/min — ABNORMAL LOW (ref >60)
GFR, EST AFRICAN AMERICAN: 16 mL/min — AB (ref >60)
Glucose, Bld: 104 mg/dL — ABNORMAL HIGH (ref 65–99)
POTASSIUM: 4.4 mmol/L (ref 3.5–5.1)
Sodium: 135 mmol/L (ref 135–145)

## 2016-12-23 MED ORDER — MIDODRINE HCL 5 MG PO TABS
10.0000 mg | ORAL_TABLET | Freq: Three times a day (TID) | ORAL | Status: DC
  Administered 2016-12-23 – 2016-12-24 (×5): 10 mg via ORAL
  Filled 2016-12-23 (×5): qty 2

## 2016-12-23 MED ORDER — OXYCODONE-ACETAMINOPHEN 5-325 MG PO TABS
1.0000 | ORAL_TABLET | Freq: Once | ORAL | Status: AC
  Administered 2016-12-23: 1 via ORAL
  Filled 2016-12-23: qty 1

## 2016-12-23 MED ORDER — OXYCODONE HCL 5 MG PO TABS
10.0000 mg | ORAL_TABLET | Freq: Four times a day (QID) | ORAL | Status: DC | PRN
  Administered 2016-12-23 – 2016-12-24 (×6): 10 mg via ORAL
  Filled 2016-12-23 (×6): qty 2

## 2016-12-23 MED ORDER — LACTULOSE 10 GM/15ML PO SOLN
10.0000 g | Freq: Three times a day (TID) | ORAL | Status: DC
  Administered 2016-12-23: 10 g via ORAL
  Filled 2016-12-23 (×3): qty 30

## 2016-12-23 NOTE — Progress Notes (Signed)
PROGRESS NOTE    Wanda [name]  EHM:[id number] DOB: 1975-11-01 DOA: 12/22/2016 PCP: [name], MD    Brief Narrative:  41 y/o female with history of ESRD on HD, NASH cirrhosis, who was admitted to the hospital with bilateral lower extremity blisters. She has signs of volume overload and will undergo HD in AM. Will likely need outpatient dermatology follow up.   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 ischemic    1. Bullous skin lesions. Etiology is unclear. This has been present for a few weeks now. No fever or leukocytosis, making infectious process less likely. I think she will benefit from a dermatology evaluation. Will try and schedule outpatient evaluation in AM 2. ESRD on HD. She still has signs of volume overload. Nephrology following. Plans for dialysis in morning. 3. Anasarca. Further fluid management with dialysis 4. Cirrhosis related to NASH. Continue on lactulose. Follow up with GI 5. Hypotension. Related to autonomic dysfunction. Continue on midodirine 6. Anemia of chronic disease, likely related to renal disease. Recheck in AM and transfuse for hemoglobin <7 7. Thrombocytopenia. Related to liver disease, no signs of bleeding.   DVT prophylaxis: heparin Code Status: full code Family Communication: no family present Disposition Plan: discharge home once improved   Consultants:   nephrology  Procedures:     Antimicrobials:       Subjective: Continues to have pain in legs  Objective: Vitals:   12/22/16 2100 12/22/16 2230 12/22/16 2325 12/23/16 0500  BP: (!) 96/45  (!) 88/60   Pulse: 92 87  87  Resp: (!) 23 (!) 22  20  Temp:  98.4 F (36.9 C)  98.8 F (37.1 C)    TempSrc:  Oral  Oral  SpO2: 96% 100%  94%  Weight:  70.5 kg (155 lb 6.8 oz)  71.6 kg (157 lb 13.6 oz)  Height:  5\' 1"  (1.549 m)      Intake/Output Summary (Last 24 hours) at 12/23/16 1719 Last data filed at 12/22/16 2300  Gross per 24 hour  Intake              240 ml  Output                0 ml  Net              240 ml   Filed Weights   12/22/16 1728 12/22/16 2230 12/23/16 0500  Weight: 73.2 kg (161 lb 6 oz) 70.5 kg (155 lb 6.8 oz) 71.6 kg (157 lb 13.6 oz)    Examination:  General exam: Appears calm and comfortable  Respiratory system: Clear to auscultation. Respiratory effort normal. Cardiovascular system: S1 & S2 heard, RRR. No JVD, murmurs, rubs, gallops or clicks. No pedal edema. Gastrointestinal system: Abdomen is nondistended, soft and nontender. No organomegaly or masses felt. Normal bowel sounds heard. Central nervous system: Alert and oriented. No focal neurological deficits. Extremities: Symmetric 5 x 5 power. Skin: multiple large bullous lesions on lower extremities bilaterally Psychiatry: Judgement and insight appear normal. Mood & affect appropriate.     Data Reviewed: I have personally reviewed following labs and imaging studies  CBC:  Recent Labs Lab 12/18/16 1205 12/21/16 1141 12/22/16 1735 12/23/16 3662  WBC 8.1 6.9 7.6 7.0  NEUTROABS  --   --  5.3  --   HGB 7.7* 7.6* 9.0* 7.0*  HCT 24.7* 25.2* 28.7* 22.9*  MCV 99.2 98.4 98.6 99.6  PLT 154 144* 147* 197*   Basic Metabolic Panel:  Recent Labs Lab 12/18/16 1205 12/21/16 1141 12/22/16 1735 12/23/16 0553  NA 132* 135 138 135  K 4.1 4.6 4.2 4.4  CL 96* 99* 96* 96*  CO2 25 24 31 30   GLUCOSE 94 86 88 104*  BUN 46* 54* 14 23*  CREATININE 6.90* 7.25* 2.69* 3.83*  CALCIUM 8.9 9.4 8.9 8.5*  PHOS 4.8* 4.5  --   --    GFR: Estimated Creatinine Clearance: 17.3 mL/min (A) (by C-G formula based on SCr of 3.83 mg/dL (H)). Liver Function Tests:  Recent Labs Lab 12/18/16 1205 12/21/16 1141  12/22/16 1735  AST  --   --  46*  ALT  --   --  16  ALKPHOS  --   --  162*  BILITOT  --   --  2.4*  PROT  --   --  6.7  ALBUMIN 2.4* 2.6* 3.3*   No results for input(s): LIPASE, AMYLASE in the last 168 hours.  Recent Labs Lab 12/22/16 1738  AMMONIA 41*   Coagulation Profile: No results for input(s): INR, PROTIME in the last 168 hours. Cardiac Enzymes: No results for input(s): CKTOTAL, CKMB, CKMBINDEX, TROPONINI in the last 168 hours. BNP (last 3 results) No results for input(s): PROBNP in the last 8760 hours. HbA1C: No results for input(s): HGBA1C in the last 72 hours. CBG: No results for input(s): GLUCAP in the last 168 hours. Lipid Profile: No results for input(s): CHOL, HDL, LDLCALC, TRIG, CHOLHDL, LDLDIRECT in the last 72 hours. Thyroid Function Tests: No results for input(s): TSH, T4TOTAL, FREET4, T3FREE, THYROIDAB in the last 72 hours. Anemia Panel: No results for input(s): VITAMINB12, FOLATE, FERRITIN, TIBC, IRON, RETICCTPCT in the last 72 hours. Sepsis Labs:  Recent Labs Lab 12/22/16 1751  LATICACIDVEN 2.90*    Recent Results (from the past 240 hour(s))  Wound or Superficial Culture     Status: None (Preliminary result)   Collection Time: 12/22/16  5:35 PM  Result Value Ref Range Status   Specimen Description WOUND  Final   Special Requests Immunocompromised  Final   Gram Stain   Final    NO WBC SEEN NO ORGANISMS SEEN Performed at [facility], [address] [place], [place] [address]    Culture PENDING  Incomplete   Report Status PENDING  Incomplete  Culture, blood (routine x 2)     Status: None (Preliminary result)   Collection Time: 12/22/16  9:00 PM  Result Value Ref Range Status   Specimen Description BLOOD RIGHT FOREARM  Final   Special Requests   Final    BOTTLES DRAWN AEROBIC AND ANAEROBIC Blood Culture results may not be optimal due to an inadequate volume of blood received in culture bottles   Culture NO GROWTH < 24 HOURS  Final    Report Status PENDING  Incomplete  Culture, blood (routine x 2)     Status: None (Preliminary result)   Collection Time: 12/22/16  9:05 PM  Result Value Ref Range Status   Specimen Description BLOOD RIGHT HAND  Final   Special Requests   Final    BOTTLES DRAWN AEROBIC AND ANAEROBIC Blood Culture results may not be optimal due to an inadequate volume of blood received in culture  bottles   Culture NO GROWTH < 24 HOURS  Final   Report Status PENDING  Incomplete         Radiology Studies: Dg Chest 2 View  Result Date: 12/22/2016 CLINICAL DATA:  Dyspnea EXAM: CHEST  2 VIEW COMPARISON:  12/13/2016 chest radiograph. FINDINGS: Stable cardiomediastinal silhouette with mild cardiomegaly and aortic atherosclerosis. No pneumothorax. No pleural effusion. Borderline mild pulmonary edema. IMPRESSION: Borderline mild congestive heart failure. Aortic atherosclerosis. Electronically Signed   By: Ilona Sorrel M.D.   On: 12/22/2016 21:19   Dg Tibia/fibula Left  Result Date: 12/22/2016 CLINICAL DATA:  Pain and swelling of the left lower leg.  Blisters. EXAM: LEFT TIBIA AND FIBULA - 2 VIEW COMPARISON:  None. FINDINGS: No acute fracture nor bone destruction of the left tibia nor fibula. Joint spaces are maintained. Cutaneous nodular densities consistent with reported soft tissue blisters are seen with soft tissue induration potentially representing cellulitis or possibly changes of venous insufficiency. IMPRESSION: 1. Negative for acute fracture nor bone destruction. Joint dislocations. 2. Soft tissue induration with cutaneous masslike abnormalities consistent with reported history of blisters. Cellulitis or venous insufficiency might account for the subcutaneous soft tissue induration. Electronically Signed   By: Ashley Royalty M.D.   On: 12/22/2016 22:29   Dg Tibia/fibula Right  Result Date: 12/22/2016 CLINICAL DATA:  Diffuse blistering of the lower extremity EXAM: RIGHT TIBIA AND FIBULA - 2 VIEW COMPARISON:   None. FINDINGS: There is subcutaneous soft tissue induration of the right leg possibly from venous insufficiency or cellulitis. Cutaneous masslike abnormalities are identified along the mid to lower leg consistent with reported history soft tissue blisters. No frank bone destruction is identified. No acute fracture is noted. IMPRESSION: Negative for acute fracture or bone destruction. Soft tissue edema with cutaneous masslike abnormalities of the leg consistent with reported soft tissue blistering. Electronically Signed   By: Ashley Royalty M.D.   On: 12/22/2016 22:27        Scheduled Meds: . [START ON 12/24/2016] Darbepoetin Alfa  60 mcg Intravenous Q Thu-HD  . heparin  5,000 Units Subcutaneous Q8H  . lactulose  10 g Oral TID  . mouth rinse  15 mL Mouth Rinse BID  . midodrine  10 mg Oral TID AC  . pantoprazole  40 mg Oral Daily  . sevelamer carbonate  1,600 mg Oral TID WC  . sodium chloride flush  3 mL Intravenous Q12H  . sucralfate  1 g Oral TID WC & HS   Continuous Infusions:   LOS: 0 days    Time spent: 59mins    Fabiola Mudgett, MD [facility] Pager [phone]  If 7PM-7AM, please contact night-coverage [email or web address] Password [facility] 12/23/2016, 5:19 PM

## 2016-12-23 NOTE — Consult Note (Signed)
Wanda [name] MRN: [id number] DOB/AGE: Oct 15, 1975 41 y.o. Primary Care Physician:Roberson, [name], MD Admit date: 12/22/2016 Chief Complaint:  Chief Complaint  Patient presents with  . Leg Swelling    blisters on legs   HPI: Pt is 41 year old caucasian female with past medical hx of ESRD who came to ER with c/o blister in legs.  HPI dates back to yesterday as pt noticed  Having blisters on her legs and patient  came to Emergency Room. Pt is currently admitted for treatment . Pt seen on 3rd floor . Pt says " it is the leg thing that is bothering me"  Pt offers No c/ofevers.  Pt  c/o  Nausea and vomiting NO c/o diarrhea. NO c/o hematemesis NO c/o syncope. Pt is on dialysis Tues/Thurs/Sat schedule.  Pt had her Hd yesterday as outpt.    Past Medical History:  Diagnosis Date  . Acute blood loss anemia 02/25/2014   Status post transfusion  . Acute renal failure ([place]) 09/2013   Pre-renal- resolved  . Anasarca 10/10/2013  . Anxiety   . Bipolar disorder ([place]) 12/04/2013   2007-SEEN IN ED FOR INVOLUNTARY COMMITMENT, UDS POS FOR AMPHETAMINES/OPIATES   . Bleeding esophageal varices ([place]) 02/28/2014   s/p banding  . C. difficile colitis 04/19/2014  . Chronic hypotension   . Cirrhosis ([place]) 10/05/13   Liver bx 11/23/13 (delayed initially due to patient refusal). c/w steatohepatitis  . Cirrhosis of liver with ascites ([place])   . Depression   . ESRD (end stage renal disease) on dialysis ([place]) 08/2014  . Folate deficiency 09/2013  . Gastroesophageal junction ulcer 09/17/2014  . GERD (gastroesophageal reflux disease)   . Hematemesis/vomiting blood 02/24/2014  . Macrocytosis 02/28/2014  . PNA (pneumonia) 10/13/2013  . SBP (spontaneous bacterial peritonitis) ([place]) 11/10/2013  . Thrombocytopenia ([place])    Hypercellular bone marrow; abundant megakaryocytes per 08/27/2014; s/p bone marrow bx        Family History  Problem Relation Age of Onset  . Heart disease Mother   . Colon cancer Neg Hx   .  Liver disease Neg Hx     Social History:  reports that she quit smoking about 8 years ago. Her smoking use included Cigarettes. She has a 5.00 pack-year smoking history. She has never used smokeless tobacco. She reports that she does not drink alcohol or use drugs.   Allergies:  Allergies  Allergen Reactions  . Iohexol Rash    Pt states rash after last CT. Pt needs pre-meds  . Lasix [Furosemide] Other (See Comments)    "doesn't work"  . Latex Itching    Medications Prior to Admission  Medication Sig Dispense Refill  . clotrimazole (LOTRIMIN) 1 % cream Apply 1 application topically 3 (three) times daily. (Patient taking differently: Apply 1 application topically 3 (three) times daily as needed. ) 30 g 3  . Darbepoetin Alfa (ARANESP) 60 MCG/0.3ML SOSY injection Inject 0.3 mLs (60 mcg total) into the vein every Thursday with hemodialysis. (Patient taking differently: Inject 60 mcg into the vein every Tuesday with hemodialysis. ) 4.2 mL   . lactulose (CHRONULAC) 10 GM/15ML solution Take 15-30 mLs (10-20 g total) by mouth 3 (three) times daily. TAKE 15-30 ml BY MOUTH three times a day    . lidocaine (XYLOCAINE) 2 % solution TAKE TWO TEASPOONSFUL (10ML) BY MOUTH BEFORE MEALS AND AT BEDTIME TO PREVENT CHEST PAIN WHILE EATING 500 mL 1  . lidocaine-prilocaine (EMLA) cream Apply 1 application topically as needed (for dialysis treatments).    [name]  LORazepam (ATIVAN) 1 MG tablet Take 1 mg by mouth 2 (two) times daily as needed for anxiety (and/or back spasms).    . midodrine (PROAMATINE) 10 MG tablet Take 1 tablet (10 mg total) by mouth 3 (three) times daily.    [name] omeprazole (PRILOSEC) 20 MG capsule 1 po every morning 30 minutes prior to your first meal. (Patient taking differently: Take 20 mg by mouth daily before breakfast. 1 po every morning 30 minutes prior to your first meal.) 31 capsule 11  . Oxycodone HCl 10 MG TABS Take 0.5 tablets (5 mg total) by mouth 3 (three) times daily as needed. 1 PO TID AS  NEEDED FOR PAIN (Patient taking differently: Take 5-10 mg by mouth 3 (three) times daily as needed. ) 1 tablet 0  . RENVELA 800 MG tablet Take 1600 mg by mouth 3 times daily with meals. Take 800 mg by mouth with snacks.  5  . sucralfate (CARAFATE) 1 GM/10ML suspension Take 1 g by mouth 4 (four) times daily -  with meals and at bedtime.         UXN:ATFTD from the symptoms mentioned above,there are no other symptoms referable to all systems reviewed.  Derrill Memo ON 12/24/2016] Darbepoetin Alfa  60 mcg Intravenous Q Thu-HD  . heparin  5,000 Units Subcutaneous Q8H  . lactulose  10 g Oral TID  . mouth rinse  15 mL Mouth Rinse BID  . midodrine  10 mg Oral TID AC  . pantoprazole  40 mg Oral Daily  . sevelamer carbonate  1,600 mg Oral TID WC  . sodium chloride flush  3 mL Intravenous Q12H  . sucralfate  1 g Oral TID WC & HS     Physical Exam: Vital signs in last 24 hours: Temp:  [98.4 F (36.9 C)-98.8 F (37.1 C)] 98.8 F (37.1 C) (05/16 0500) Pulse Rate:  [87-113] 87 (05/16 0500) Resp:  [20-27] 20 (05/16 0500) BP: (88-100)/(35-60) 88/60 (05/15 2325) SpO2:  [94 %-100 %] 94 % (05/16 0500) Weight:  [155 lb 6.8 oz (70.5 kg)-161 lb 6 oz (73.2 kg)] 157 lb 13.6 oz (71.6 kg) (05/16 0500) Weight change:  Last BM Date: 12/22/16  Intake/Output from previous day: 05/15 0701 - 05/16 0700 In: 240 [P.O.:240] Out: -  No intake/output data recorded.   Physical Exam: General- pt is awake,alert, oriented to time place and person Resp- No acute REsp distress, decreased at bases CVS- S1S2 regular in rate and rhythm GIT- BS+, soft, umbilical hernia, tenderness over erythema site EXT- 2+ LE Edema,NO Cyanosis, Bullae lesions present  CNS- CN 2-12 grossly intact. Moving all 4 extremities Psych- normal mood and affect Access-  AVF+   Lab Results: CBC  Recent Labs  12/22/16 1735 12/23/16 0553  WBC 7.6 7.0  HGB 9.0* 7.0*  HCT 28.7* 22.9*  PLT 147* 132*    BMET  Recent Labs   12/22/16 1735 12/23/16 0553  NA 138 135  K 4.2 4.4  CL 96* 96*  CO2 31 30  GLUCOSE 88 104*  BUN 14 23*  CREATININE 2.69* 3.83*  CALCIUM 8.9 8.5*    MICRO Recent Results (from the past 240 hour(s))  Wound or Superficial Culture     Status: None (Preliminary result)   Collection Time: 12/22/16  5:35 PM  Result Value Ref Range Status   Specimen Description WOUND  Final   Special Requests Immunocompromised  Final   Gram Stain   Final    NO WBC SEEN NO ORGANISMS SEEN  Performed at [facility], [place] [address] [place], [place] [address]    Culture PENDING  Incomplete   Report Status PENDING  Incomplete  Culture, blood (routine x 2)     Status: None (Preliminary result)   Collection Time: 12/22/16  9:00 PM  Result Value Ref Range Status   Specimen Description BLOOD RIGHT FOREARM  Final   Special Requests   Final    BOTTLES DRAWN AEROBIC AND ANAEROBIC Blood Culture results may not be optimal due to an inadequate volume of blood received in culture bottles   Culture PENDING  Incomplete   Report Status PENDING  Incomplete  Culture, blood (routine x 2)     Status: None (Preliminary result)   Collection Time: 12/22/16  9:05 PM  Result Value Ref Range Status   Specimen Description BLOOD RIGHT HAND  Final   Special Requests   Final    BOTTLES DRAWN AEROBIC AND ANAEROBIC Blood Culture results may not be optimal due to an inadequate volume of blood received in culture bottles   Culture PENDING  Incomplete   Report Status PENDING  Incomplete      Lab Results  Component Value Date   CALCIUM 8.5 (L) 12/23/2016   CAION 0.90 (L) 09/22/2016   PHOS 4.5 12/21/2016      Impression: 1)RenalESRD  HD started on  08/18/14  Pt is on Tue/Thurs/Saturday schedule   Will dialyze today secondary to fluid overload issues                Pt does have hx of non adherence to dialysis               2)CVS- Hemodynamically fragile  BP  low  On Midodrine   3)Anemia IN ESRD the goal for HGb is  9--11 Pt hgb is not at  the goal  Will keep on  epo    4)Liver- Cirrhosis Sec to Steato hepatitis  Primary team following  5)Electrolytes  Normokalemia  Normonatremia    6)ID-admitted with po bullus skin disease On IV AbX.   7)Acid base Co2   at goal  8) Thrombocytopenia-Stable Primary MD following    Plan:  Will dialzye in am Will keep on Midodrine Will use 3k bath Will keep on epo May need albumin for hypotension during hd.       Josef Tourigny S 12/23/2016, 10:44 AM

## 2016-12-23 NOTE — Care Management Obs Status (Signed)
[place] NOTIFICATION   Patient Details  Name: Wanda [name] MRN: [id number] Date of Birth: March 31, 1976   Medicare Observation Status Notification Given:  Yes    Jeremiyah Cullens, Chauncey Reading, RN 12/23/2016, 8:42 AM

## 2016-12-24 DIAGNOSIS — G894 Chronic pain syndrome: Secondary | ICD-10-CM

## 2016-12-24 DIAGNOSIS — L139 Bullous disorder, unspecified: Secondary | ICD-10-CM | POA: Diagnosis not present

## 2016-12-24 DIAGNOSIS — N186 End stage renal disease: Secondary | ICD-10-CM | POA: Diagnosis not present

## 2016-12-24 DIAGNOSIS — R601 Generalized edema: Secondary | ICD-10-CM | POA: Diagnosis not present

## 2016-12-24 LAB — BASIC METABOLIC PANEL
ANION GAP: 11 (ref 5–15)
BUN: 40 mg/dL — ABNORMAL HIGH (ref 6–20)
CHLORIDE: 95 mmol/L — AB (ref 101–111)
CO2: 27 mmol/L (ref 22–32)
Calcium: 8.7 mg/dL — ABNORMAL LOW (ref 8.9–10.3)
Creatinine, Ser: 5.62 mg/dL — ABNORMAL HIGH (ref 0.44–1.00)
GFR, EST AFRICAN AMERICAN: 10 mL/min — AB (ref >60)
GFR, EST NON AFRICAN AMERICAN: 8 mL/min — AB (ref >60)
GLUCOSE: 114 mg/dL — AB (ref 65–99)
POTASSIUM: 4.3 mmol/L (ref 3.5–5.1)
Sodium: 133 mmol/L — ABNORMAL LOW (ref 135–145)

## 2016-12-24 LAB — CBC
HCT: 24.6 % — ABNORMAL LOW (ref 36.0–46.0)
Hemoglobin: 7.6 g/dL — ABNORMAL LOW (ref 12.0–15.0)
MCH: 30.5 pg (ref 26.0–34.0)
MCHC: 30.9 g/dL (ref 30.0–36.0)
MCV: 98.8 fL (ref 78.0–100.0)
Platelets: 151 10*3/uL (ref 150–400)
RBC: 2.49 MIL/uL — ABNORMAL LOW (ref 3.87–5.11)
RDW: 19 % — ABNORMAL HIGH (ref 11.5–15.5)
WBC: 5.7 10*3/uL (ref 4.0–10.5)

## 2016-12-24 MED ORDER — PENTAFLUOROPROP-TETRAFLUOROETH EX AERO: 1.0000 "application " | INHALATION_SPRAY | CUTANEOUS | Status: DC | PRN

## 2016-12-24 MED ORDER — EPOETIN ALFA 10000 UNIT/ML IJ SOLN: 10000.0000 [IU] | INTRAMUSCULAR | Status: DC

## 2016-12-24 MED ORDER — SODIUM CHLORIDE 0.9 % IV SOLN: 100.0000 mL | INTRAVENOUS | Status: DC | PRN

## 2016-12-24 MED ORDER — EPOETIN ALFA 10000 UNIT/ML IJ SOLN
INTRAMUSCULAR | Status: AC
  Filled 2016-12-24: qty 1

## 2016-12-24 MED ORDER — ALBUMIN HUMAN 25 % IV SOLN
25.0000 g | Freq: Once | INTRAVENOUS | Status: AC
  Administered 2016-12-24: 25 g via INTRAVENOUS
  Filled 2016-12-24: qty 100

## 2016-12-24 MED ORDER — ALTEPLASE 2 MG IJ SOLR
2.0000 mg | Freq: Once | INTRAMUSCULAR | Status: DC | PRN
  Filled 2016-12-24: qty 2

## 2016-12-24 MED ORDER — HEPARIN SODIUM (PORCINE) 1000 UNIT/ML DIALYSIS
1000.0000 [IU] | INTRAMUSCULAR | Status: DC | PRN
  Filled 2016-12-24: qty 1

## 2016-12-24 MED ORDER — LIDOCAINE-PRILOCAINE 2.5-2.5 % EX CREA: 1.0000 "application " | TOPICAL_CREAM | CUTANEOUS | Status: DC | PRN

## 2016-12-24 MED ORDER — LIDOCAINE HCL (PF) 1 % IJ SOLN: 5.0000 mL | INTRAMUSCULAR | Status: DC | PRN

## 2016-12-24 MED ORDER — EPOETIN ALFA 10000 UNIT/ML IJ SOLN
10000.0000 [IU] | INTRAMUSCULAR | Status: DC
  Administered 2016-12-24: 09:00:00 via INTRAVENOUS

## 2016-12-24 NOTE — Progress Notes (Signed)
Patient being d/c home with instructions. IV cath removed and intact. No c/o pain at this time or at site. Patient going to Dermatologist appointment at 2pm.

## 2016-12-24 NOTE — Progress Notes (Signed)
Subjective: Interval History: has no complaint of nausea or vomiting. She complains of weakness and leg swelling..  Objective: Vital signs in last 24 hours: Temp:  [98.1 F (36.7 C)-98.9 F (37.2 C)] 98.1 F (36.7 C) (05/17 0500) Pulse Rate:  [79-85] 79 (05/17 0500) Resp:  [18-19] 18 (05/17 0500) BP: (90-112)/(33-47) 112/47 (05/17 0500) SpO2:  [92 %-97 %] 94 % (05/17 0500) Weight:  [71.3 kg (157 lb 3 oz)] 71.3 kg (157 lb 3 oz) (05/17 0500) Weight change: -1.9 kg (-4 lb 3 oz)  Intake/Output from previous day: 05/16 0701 - 05/17 0700 In: 1320 [P.O.:1320] Out: -  Intake/Output this shift: No intake/output data recorded.  Generally patient is alert and in no apparent distress Chest: Decreased breath sound bilaterally Heart exam regular rate and rhythm no S3 Abdomen: Distended but none tender Extremities: Patient with bilateral edema and also multiple bilateral bullae  Lab Results:  Recent Labs  12/22/16 1735 12/23/16 0553  WBC 7.6 7.0  HGB 9.0* 7.0*  HCT 28.7* 22.9*  PLT 147* 132*   BMET:  Recent Labs  12/22/16 1735 12/23/16 0553  NA 138 135  K 4.2 4.4  CL 96* 96*  CO2 31 30  GLUCOSE 88 104*  BUN 14 23*  CREATININE 2.69* 3.83*  CALCIUM 8.9 8.5*   No results for input(s): PTH in the last 72 hours. Iron Studies: No results for input(s): IRON, TIBC, TRANSFERRIN, FERRITIN in the last 72 hours.  Studies/Results: Dg Chest 2 View  Result Date: 12/22/2016 CLINICAL DATA:  Dyspnea EXAM: CHEST  2 VIEW COMPARISON:  12/13/2016 chest radiograph. FINDINGS: Stable cardiomediastinal silhouette with mild cardiomegaly and aortic atherosclerosis. No pneumothorax. No pleural effusion. Borderline mild pulmonary edema. IMPRESSION: Borderline mild congestive heart failure. Aortic atherosclerosis. Electronically Signed   By: Ilona Sorrel M.D.   On: 12/22/2016 21:19   Dg Tibia/fibula Left  Result Date: 12/22/2016 CLINICAL DATA:  Pain and swelling of the left lower leg.  Blisters.  EXAM: LEFT TIBIA AND FIBULA - 2 VIEW COMPARISON:  None. FINDINGS: No acute fracture nor bone destruction of the left tibia nor fibula. Joint spaces are maintained. Cutaneous nodular densities consistent with reported soft tissue blisters are seen with soft tissue induration potentially representing cellulitis or possibly changes of venous insufficiency. IMPRESSION: 1. Negative for acute fracture nor bone destruction. Joint dislocations. 2. Soft tissue induration with cutaneous masslike abnormalities consistent with reported history of blisters. Cellulitis or venous insufficiency might account for the subcutaneous soft tissue induration. Electronically Signed   By: Ashley Royalty M.D.   On: 12/22/2016 22:29   Dg Tibia/fibula Right  Result Date: 12/22/2016 CLINICAL DATA:  Diffuse blistering of the lower extremity EXAM: RIGHT TIBIA AND FIBULA - 2 VIEW COMPARISON:  None. FINDINGS: There is subcutaneous soft tissue induration of the right leg possibly from venous insufficiency or cellulitis. Cutaneous masslike abnormalities are identified along the mid to lower leg consistent with reported history soft tissue blisters. No frank bone destruction is identified. No acute fracture is noted. IMPRESSION: Negative for acute fracture or bone destruction. Soft tissue edema with cutaneous masslike abnormalities of the leg consistent with reported soft tissue blistering. Electronically Signed   By: Ashley Royalty M.D.   On: 12/22/2016 22:27    I have reviewed the patient's current medications.  Assessment/Plan: Problem #1 bilateral bilateral bullae: Etiology as this moment but clear. Patient was given empirically vancomycin on dialysis. How ever patient had bilateral leg swelling and edema when she was admitted initially to the blisters  seems to be somewhat new since her discharge. Presently she is a febrile.  Problem #2 end-stage renal disease: She is status post hemodialysis on Tuesday and she is due for today. Problem #3  fluid management: Patient is still with bilateral significant edema but has improved significantly since her last admission. Problem #4 hypotension: Recurrent problem. Patient is on Midodrine Problem #5 anemia: Her hemoglobin is below target goal and she is on Epogen Problem #6 bone and mineral disorder: Her calcium is range but phosphorus was high last time. Patient on a binder as an outpatient Problem #7 history of liver cirrhosis with ascites Plan: 1]Will dialyze patient today 2]We'll try to remove 3 liters of  fluid and we may need to use albumin because of her hypotension.   LOS: 0 days   Wanda [name] S 12/24/2016,8:00 AM

## 2016-12-24 NOTE — Care Management Important Message (Signed)
Important Message  Patient Details  Name: Wanda [name] MRN: [id number] Date of Birth: 07/23/1976   Medicare Important Message Given:  Yes    Burnice Vassel, Chauncey Reading, RN 12/24/2016, 2:18 PM

## 2016-12-24 NOTE — Care Management Note (Signed)
Case Management Note  Patient Details  Name: Wanda [name] MRN: [id number] Date of Birth: 01/31/1976     Expected Discharge Date:  12/24/16               Expected Discharge Plan:  Home/Self Care  In-House Referral:     Discharge planning Services  CM Consult  Post Acute Care Choice:    Choice offered to:     DME Arranged:    DME Agency:     HH Arranged:    [facility] Agency:     Status of Service:  Completed, signed off  If discussed at [employer] of Stay Meetings, dates discussed:    Additional Comments: Patient discharging today, continues to decline all services offered such as HH and [facility] paramedicine program. No CM needs.  Salli Bodin, Chauncey Reading, RN 12/24/2016, 1:37 PM

## 2016-12-24 NOTE — Progress Notes (Signed)
Pt refused all night medications but carafate and pain medication. Educated pt on medication compliance. Pt also consistently asking for fluids after having met her restriction for the day. Educated pt on stretching fluid consumption and following restriction.

## 2016-12-24 NOTE — Discharge Summary (Signed)
Physician Discharge Summary  Wanda [name] TKP:[id number] DOB: Sep 24, 1975 DOA: 12/22/2016  PCP: [name], MD  Admit date: 12/22/2016 Discharge date: 12/24/2016  Admitted From: home Disposition:  home  Recommendations for Outpatient Follow-up:  1. Follow up with PCP in 1-2 weeks 2. Please obtain BMP/CBC in one week 3. Patient has been scheduled to follow up with dermatology, Dr. Nevada Crane later today at 2:20pm to further evaluate bullous lesions  Home Health: refused home health and paramedicine  Discharge Condition:stable CODE STATUS: full code Diet recommendation: Heart Healthy   Brief/Interim Summary: 41 year old female with history of end-stage renal disease on hemodialysis, Wanda [name] cirrhosis, was recently in the hospital for massive volume overload due to noncompliance with dialysis. She was discharged, to return to the hospital with worsening bullous lesions in her lower extremities bilaterally. She reports that these have been progressively worse over the past few weeks. She's not had any fever. It was felt that these may be related to recent volume overload and massive lower extremity edema. Other etiologies also include the bullous pemphigoid. She did not have any evidence of cellulitis or underlying infection. Labs were otherwise unremarkable. Patient has been scheduled to see dermatology, Dr. Nevada Crane later today to 20 p.m. May benefit from skin biopsy. The remainder of her medical issues are stable. She is otherwise stable for discharge.  Discharge Diagnoses:  Principal Problem:   Bullous skin disease Active Problems:   Anasarca   Liver cirrhosis secondary to nonalcoholic steatohepatitis (NASH) (HCC)   Portal hypertensive gastropathy (HCC)   Thrombocytopenia (HCC)   End stage renal disease on dialysis [facility]   Chronic pain syndrome   Cardiomyopathy- ? ischemic     Discharge Instructions  Discharge Instructions    Diet - low sodium heart healthy    Complete by:  As  directed    Increase activity slowly    Complete by:  As directed      Allergies as of 12/24/2016      Reactions   Iohexol Rash   Pt states rash after last CT. Pt needs pre-meds   Lasix [furosemide] Other (See Comments)   "doesn't work"   Latex Itching      Medication List    TAKE these medications   clotrimazole 1 % cream Commonly known as:  LOTRIMIN Apply 1 application topically 3 (three) times daily. What changed:  when to take this  reasons to take this   Darbepoetin Alfa 60 MCG/0.3ML Sosy injection Commonly known as:  ARANESP Inject 0.3 mLs (60 mcg total) into the vein every Thursday with hemodialysis. What changed:  when to take this   lactulose 10 GM/15ML solution Commonly known as:  CHRONULAC Take 15-30 mLs (10-20 g total) by mouth 3 (three) times daily. TAKE 15-30 ml BY MOUTH three times a day   lidocaine 2 % solution Commonly known as:  XYLOCAINE TAKE TWO TEASPOONSFUL (10ML) BY MOUTH BEFORE MEALS AND AT BEDTIME TO PREVENT CHEST PAIN WHILE EATING   lidocaine-prilocaine cream Commonly known as:  EMLA Apply 1 application topically as needed (for dialysis treatments).   LORazepam 1 MG tablet Commonly known as:  ATIVAN Take 1 mg by mouth 2 (two) times daily as needed for anxiety (and/or back spasms).   midodrine 10 MG tablet Commonly known as:  PROAMATINE Take 1 tablet (10 mg total) by mouth 3 (three) times daily.   omeprazole 20 MG capsule Commonly known as:  PRILOSEC 1 po every morning 30 minutes prior to your first meal. What changed:  how much to take  how to take this  when to take this  additional instructions   Oxycodone HCl 10 MG Tabs Take 0.5 tablets (5 mg total) by mouth 3 (three) times daily as needed. 1 PO TID AS NEEDED FOR PAIN What changed:  how much to take  additional instructions   RENVELA 800 MG tablet Generic drug:  sevelamer carbonate Take 1600 mg by mouth 3 times daily with meals. Take 800 mg by mouth with snacks.    sucralfate 1 GM/10ML suspension Commonly known as:  CARAFATE Take 1 g by mouth 4 (four) times daily -  with meals and at bedtime.       Allergies  Allergen Reactions  . Iohexol Rash    Pt states rash after last CT. Pt needs pre-meds  . Lasix [Furosemide] Other (See Comments)    "doesn't work"  . Latex Itching    Consultations:  nephrology   Procedures/Studies: Dg Chest 2 View  Result Date: 12/22/2016 CLINICAL DATA:  Dyspnea EXAM: CHEST  2 VIEW COMPARISON:  12/13/2016 chest radiograph. FINDINGS: Stable cardiomediastinal silhouette with mild cardiomegaly and aortic atherosclerosis. No pneumothorax. No pleural effusion. Borderline mild pulmonary edema. IMPRESSION: Borderline mild congestive heart failure. Aortic atherosclerosis. Electronically Signed   By: Ilona Sorrel M.D.   On: 12/22/2016 21:19   Dg Chest 2 View  Result Date: 12/13/2016 CLINICAL DATA:  Leg and abdominal swelling, no dialysis since April 28th. History of cirrhosis. EXAM: CHEST  2 VIEW COMPARISON:  Chest radiograph Dec 08, 2016 FINDINGS: Cardiac silhouette is mildly enlarged. Mediastinal silhouette is nonsuspicious. Calcified aortic knob. Similar pulmonary vascular congestion and chronic bronchitic changes without pleural effusion or focal consolidation Included abdomen demonstrates distention with bowel air-fluid levels at a similar level. IMPRESSION: Stable cardiomegaly and pulmonary vascular congestion. Mild bronchitic changes without focal consolidation. Suspected ascites. Electronically Signed   By: Elon Alas M.D.   On: 12/13/2016 05:29   Dg Chest 2 View  Result Date: 12/08/2016 CLINICAL DATA:  Weakness, no chest complaints. Acute renal failure. History of cirrhosis, former smoker. EXAM: CHEST  2 VIEW COMPARISON:  Portable chest x-ray of November 22, 2016 FINDINGS: The lungs are adequately inflated. The pulmonary vascularity is mildly prominent. The interstitial markings are less conspicuous today. The cardiac  silhouette is mildly enlarged. The retrocardiac density has decreased. There is no significant pleural effusion. There is calcification in the wall of the aortic arch. The mediastinum is normal in width. IMPRESSION: Chronic bronchitic changes, stable. Improving left lower lobe atelectasis or infiltrate. Decreased pulmonary interstitial edema and pulmonary vascular congestion consistent with improving CHF. Thoracic aortic atherosclerosis. Electronically Signed   By: David  Martinique M.D.   On: 12/08/2016 14:44   Dg Tibia/fibula Left  Result Date: 12/22/2016 CLINICAL DATA:  Pain and swelling of the left lower leg.  Blisters. EXAM: LEFT TIBIA AND FIBULA - 2 VIEW COMPARISON:  None. FINDINGS: No acute fracture nor bone destruction of the left tibia nor fibula. Joint spaces are maintained. Cutaneous nodular densities consistent with reported soft tissue blisters are seen with soft tissue induration potentially representing cellulitis or possibly changes of venous insufficiency. IMPRESSION: 1. Negative for acute fracture nor bone destruction. Joint dislocations. 2. Soft tissue induration with cutaneous masslike abnormalities consistent with reported history of blisters. Cellulitis or venous insufficiency might account for the subcutaneous soft tissue induration. Electronically Signed   By: Ashley Royalty M.D.   On: 12/22/2016 22:29   Dg Tibia/fibula Right  Result Date: 12/22/2016 CLINICAL DATA:  Diffuse blistering of the lower extremity EXAM: RIGHT TIBIA AND FIBULA - 2 VIEW COMPARISON:  None. FINDINGS: There is subcutaneous soft tissue induration of the right leg possibly from venous insufficiency or cellulitis. Cutaneous masslike abnormalities are identified along the mid to lower leg consistent with reported history soft tissue blisters. No frank bone destruction is identified. No acute fracture is noted. IMPRESSION: Negative for acute fracture or bone destruction. Soft tissue edema with cutaneous masslike  abnormalities of the leg consistent with reported soft tissue blistering. Electronically Signed   By: Ashley Royalty M.D.   On: 12/22/2016 22:27   US Paracentesis  Result Date: 12/14/2016 INDICATION: Cirrhosis, ascites EXAM: ULTRASOUND GUIDED THERAPEUTIC PARACENTESIS MEDICATIONS: None. COMPLICATIONS: None immediate. PROCEDURE: Procedure, benefits, and risks of procedure were discussed with patient. Written informed consent for procedure was obtained. Time out protocol followed. Adequate collection of ascites localized by ultrasound in RIGHT lower quadrant. Skin prepped and draped in usual sterile fashion. Skin and soft tissues anesthetized with 10 mL of 1% lidocaine. 5 [place] Yueh catheter placed into peritoneal cavity. 5.4 L of yellow fluid aspirated by vacuum bottle suction. Procedure tolerated well by patient without immediate complication. FINDINGS: As above IMPRESSION: Successful ultrasound-guided paracentesis yielding 5.4 liters of peritoneal fluid. Electronically Signed   By: Lavonia Dana M.D.   On: 12/14/2016 15:36   Dg Hip Unilat W Or Wo Pelvis 2-3 Views Right  Result Date: 12/13/2016 CLINICAL DATA:  Leg and abdominal swelling, no dialysis since April 28th. History of cirrhosis. EXAM: DG HIP (WITH OR WITHOUT PELVIS) 2-3V RIGHT COMPARISON:  None. FINDINGS: There is no evidence of hip fracture or dislocation. There is no evidence of arthropathy or other focal bone abnormality. Moderate vascular calcifications. Large body habitus. IMPRESSION: No acute osseous process. Electronically Signed   By: Elon Alas M.D.   On: 12/13/2016 05:30       Subjective: No new complaints, no shortness of breath  Discharge Exam: Vitals:   12/24/16 1200 12/24/16 1230  BP: (!) 85/39 (!) 92/40  Pulse: 84 93  Resp:    Temp:     Vitals:   12/24/16 1100 12/24/16 1130 12/24/16 1200 12/24/16 1230  BP: (!) 88/43 (!) 85/43 (!) 85/39 (!) 92/40  Pulse: 83 81 84 93  Resp:      Temp:      TempSrc:      SpO2:       Weight:      Height:        General: Pt is alert, awake, not in acute distress Cardiovascular: RRR, S1/S2 +, no rubs, no gallops Respiratory: CTA bilaterally, no wheezing, no rhonchi Abdominal: Soft, NT, bowel sounds + Extremities: large bullous lesions on bilateral lower extremities    The results of significant diagnostics from this hospitalization (including imaging, microbiology, ancillary and laboratory) are listed below for reference.     Microbiology: Recent Results (from the past 240 hour(s))  Wound or Superficial Culture     Status: None (Preliminary result)   Collection Time: 12/22/16  5:35 PM  Result Value Ref Range Status   Specimen Description WOUND  Final   Special Requests Immunocompromised  Final   Gram Stain NO WBC SEEN NO ORGANISMS SEEN   Final   Culture   Final    CULTURE REINCUBATED FOR BETTER GROWTH Performed at [facility], [address], [place], [place] [address]    Report Status PENDING  Incomplete  Culture, blood (routine x 2)     Status:  None (Preliminary result)   Collection Time: 12/22/16  9:00 PM  Result Value Ref Range Status   Specimen Description BLOOD RIGHT FOREARM  Final   Special Requests   Final    BOTTLES DRAWN AEROBIC AND ANAEROBIC Blood Culture results may not be optimal due to an inadequate volume of blood received in culture bottles   Culture NO GROWTH 2 DAYS  Final   Report Status PENDING  Incomplete  Culture, blood (routine x 2)     Status: None (Preliminary result)   Collection Time: 12/22/16  9:05 PM  Result Value Ref Range Status   Specimen Description BLOOD RIGHT HAND  Final   Special Requests   Final    BOTTLES DRAWN AEROBIC AND ANAEROBIC Blood Culture results may not be optimal due to an inadequate volume of blood received in culture bottles   Culture NO GROWTH 2 DAYS  Final   Report Status PENDING  Incomplete     Labs: BNP (last 3 results)  Recent Labs  05/19/16 1510  BNP 76.7   Basic Metabolic  Panel:  Recent Labs Lab 12/18/16 1205 12/21/16 1141 12/22/16 1735 12/23/16 0553 12/24/16 0929  NA 132* 135 138 135 133*  K 4.1 4.6 4.2 4.4 4.3  CL 96* 99* 96* 96* 95*  CO2 25 24 31 30 27   GLUCOSE 94 86 88 104* 114*  BUN 46* 54* 14 23* 40*  CREATININE 6.90* 7.25* 2.69* 3.83* 5.62*  CALCIUM 8.9 9.4 8.9 8.5* 8.7*  PHOS 4.8* 4.5  --   --   --    Liver Function Tests:  Recent Labs Lab 12/18/16 1205 12/21/16 1141 12/22/16 1735  AST  --   --  46*  ALT  --   --  16  ALKPHOS  --   --  162*  BILITOT  --   --  2.4*  PROT  --   --  6.7  ALBUMIN 2.4* 2.6* 3.3*   No results for input(s): LIPASE, AMYLASE in the last 168 hours.  Recent Labs Lab 12/22/16 1738  AMMONIA 41*   CBC:  Recent Labs Lab 12/18/16 1205 12/21/16 1141 12/22/16 1735 12/23/16 0553 12/24/16 0929  WBC 8.1 6.9 7.6 7.0 5.7  NEUTROABS  --   --  5.3  --   --   HGB 7.7* 7.6* 9.0* 7.0* 7.6*  HCT 24.7* 25.2* 28.7* 22.9* 24.6*  MCV 99.2 98.4 98.6 99.6 98.8  PLT 154 144* 147* 132* 151   Cardiac Enzymes: No results for input(s): CKTOTAL, CKMB, CKMBINDEX, TROPONINI in the last 168 hours. BNP: Invalid input(s): POCBNP CBG: No results for input(s): GLUCAP in the last 168 hours. D-Dimer No results for input(s): DDIMER in the last 72 hours. Hgb A1c No results for input(s): HGBA1C in the last 72 hours. Lipid Profile No results for input(s): CHOL, HDL, LDLCALC, TRIG, CHOLHDL, LDLDIRECT in the last 72 hours. Thyroid function studies No results for input(s): TSH, T4TOTAL, T3FREE, THYROIDAB in the last 72 hours.  Invalid input(s): FREET3 Anemia work up No results for input(s): VITAMINB12, FOLATE, FERRITIN, TIBC, IRON, RETICCTPCT in the last 72 hours. Urinalysis    Component Value Date/Time   COLORURINE AMBER (A) 12/08/2016 1502   APPEARANCEUR CLOUDY (A) 12/08/2016 1502   LABSPEC 1.016 12/08/2016 1502   PHURINE 5.0 12/08/2016 1502   GLUCOSEU NEGATIVE 12/08/2016 1502   HGBUR SMALL (A) 12/08/2016 1502    BILIRUBINUR NEGATIVE 12/08/2016 1502   KETONESUR NEGATIVE 12/08/2016 1502   PROTEINUR 100 (A) 12/08/2016 1502  UROBILINOGEN 0.2 08/13/2014 2138   NITRITE NEGATIVE 12/08/2016 1502   LEUKOCYTESUR MODERATE (A) 12/08/2016 1502   Sepsis Labs Invalid input(s): PROCALCITONIN,  WBC,  LACTICIDVEN Microbiology Recent Results (from the past 240 hour(s))  Wound or Superficial Culture     Status: None (Preliminary result)   Collection Time: 12/22/16  5:35 PM  Result Value Ref Range Status   Specimen Description WOUND  Final   Special Requests Immunocompromised  Final   Gram Stain NO WBC SEEN NO ORGANISMS SEEN   Final   Culture   Final    CULTURE REINCUBATED FOR BETTER GROWTH Performed at [facility], [address], [place], [place] [address]    Report Status PENDING  Incomplete  Culture, blood (routine x 2)     Status: None (Preliminary result)   Collection Time: 12/22/16  9:00 PM  Result Value Ref Range Status   Specimen Description BLOOD RIGHT FOREARM  Final   Special Requests   Final    BOTTLES DRAWN AEROBIC AND ANAEROBIC Blood Culture results may not be optimal due to an inadequate volume of blood received in culture bottles   Culture NO GROWTH 2 DAYS  Final   Report Status PENDING  Incomplete  Culture, blood (routine x 2)     Status: None (Preliminary result)   Collection Time: 12/22/16  9:05 PM  Result Value Ref Range Status   Specimen Description BLOOD RIGHT HAND  Final   Special Requests   Final    BOTTLES DRAWN AEROBIC AND ANAEROBIC Blood Culture results may not be optimal due to an inadequate volume of blood received in culture bottles   Culture NO GROWTH 2 DAYS  Final   Report Status PENDING  Incomplete     Time coordinating discharge: Over 30 minutes  SIGNED:   Kathie Dike, MD  [facility] 12/24/2016, 1:02 PM Pager   If 7PM-7AM, please contact night-coverage [email or web address] Password [email or web address]

## 2016-12-24 NOTE — Progress Notes (Signed)
Pt refusing blood draw from lab this morning, states she will let hemodialysis nurse get the blood needed.

## 2016-12-25 ENCOUNTER — Telehealth: Payer: Self-pay

## 2016-12-25 ENCOUNTER — Other Ambulatory Visit: Payer: Self-pay | Admitting: Gastroenterology

## 2016-12-25 ENCOUNTER — Other Ambulatory Visit: Payer: Self-pay

## 2016-12-25 LAB — AEROBIC CULTURE  (SUPERFICIAL SPECIMEN)

## 2016-12-25 LAB — AEROBIC CULTURE W GRAM STAIN (SUPERFICIAL SPECIMEN)
Culture: NORMAL
Gram Stain: NONE SEEN

## 2016-12-25 MED ORDER — CLOTRIMAZOLE 1 % EX CREA: 1.0000 "application " | TOPICAL_CREAM | Freq: Three times a day (TID) | CUTANEOUS | 1 refills | Status: AC

## 2016-12-25 MED ORDER — LIDOCAINE VISCOUS 2 % MT SOLN: OROMUCOSAL | 1 refills | Status: DC

## 2016-12-25 NOTE — Telephone Encounter (Signed)
Pt left Vm and asked if Dr. Oneida Alar could write order for a mechanical bed.

## 2016-12-27 LAB — CULTURE, BLOOD (ROUTINE X 2)
CULTURE: NO GROWTH
Culture: NO GROWTH

## 2016-12-30 NOTE — Telephone Encounter (Signed)
I called [facility] and spoke to [facility]. Her return call number is [phone].  She said to fax the order and demographics to [phone].  I have done so and [name] is aware we were faxing the order to [place].

## 2016-12-30 NOTE — Telephone Encounter (Signed)
PLEASE CALL PT. CALL [facility] AND GIVE ORDER FOR HOSPITAL BED, DX: CIRRHOSIS, ESRD, LEG EDEMA, HEPATIC ENCEPHALOPATHY, ASCITES.

## 2017-01-03 IMAGING — US US PARACENTESIS
1 series · 2 of 2 positions shown · non-contrast
Comparison: none

INDICATION: Cirrhosis, ascites

[Series 1: us paracentesis · 0.21mm/px · 2 of 2 slices shown]
[im 1/2]
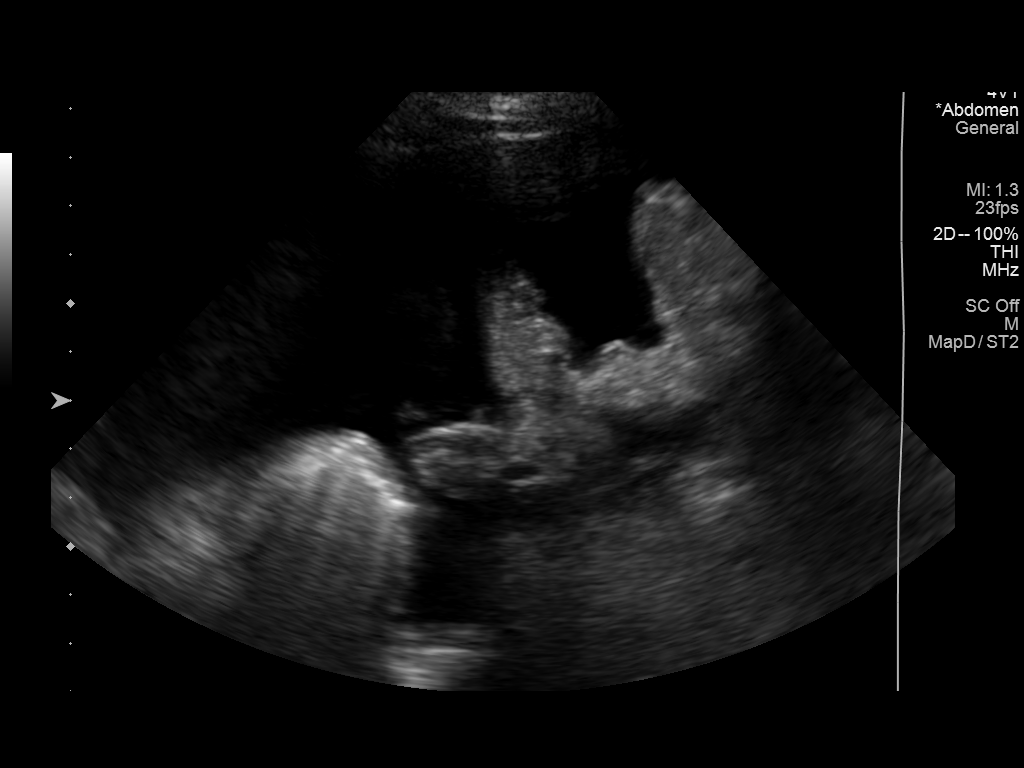
[im 2/2]
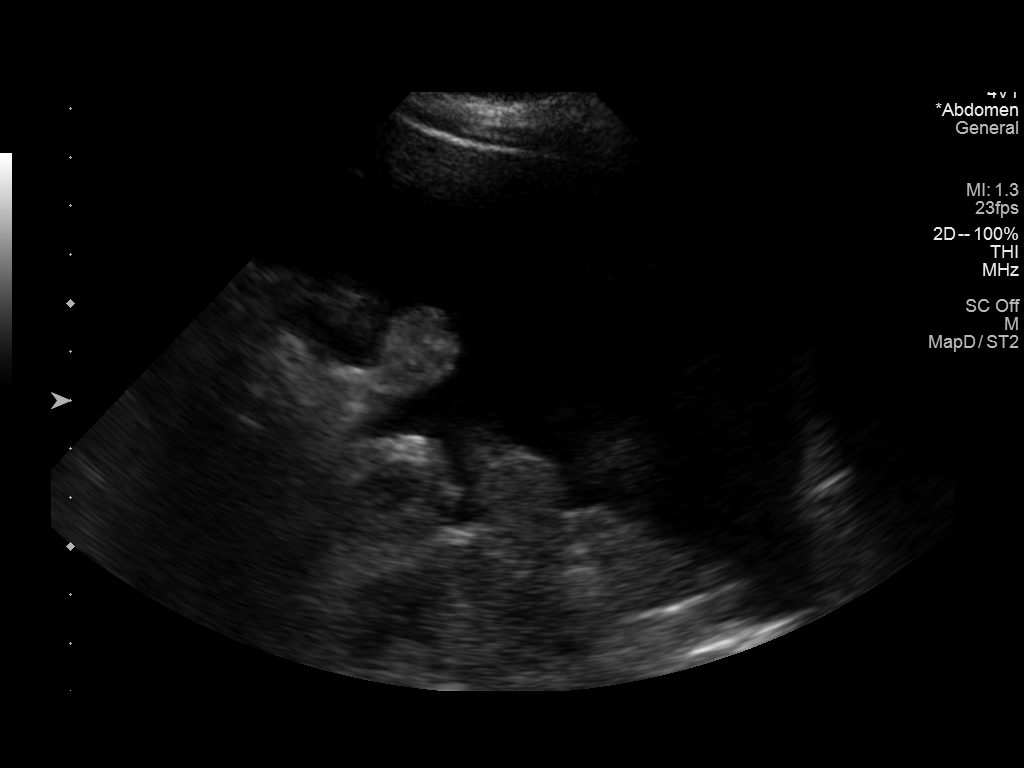

[2 of 2 positions shown; findings below may reference images not displayed]

EXAM:
ULTRASOUND GUIDED THERAPEUTIC PARACENTESIS

MEDICATIONS:
None.

COMPLICATIONS:
None immediate.

PROCEDURE:
Procedure, benefits, and risks of procedure were discussed with
patient.

Written informed consent for procedure was obtained.

Time out protocol followed.

Adequate collection of ascites localized by ultrasound in RIGHT
lower quadrant.

Skin prepped and draped in usual sterile fashion.

Skin and soft tissues anesthetized with 10 mL of 1% lidocaine.

5 [place] Yueh catheter placed into peritoneal cavity.

4.5 L of yellow ascitic fluid aspirated by vacuum bottle suction.

Procedure tolerated well by patient without immediate complication.
FINDINGS: As above
IMPRESSION: Successful ultrasound-guided paracentesis yielding 4.5 liters of
peritoneal fluid.

## 2017-01-10 IMAGING — US US PARACENTESIS
1 series · 5 of 5 positions shown · non-contrast
Comparison: none

INDICATION: Cirrhosis, ascites

[Series 1: us paracentesis · 0.21mm/px · 5 of 5 slices shown]
[im 1/5]
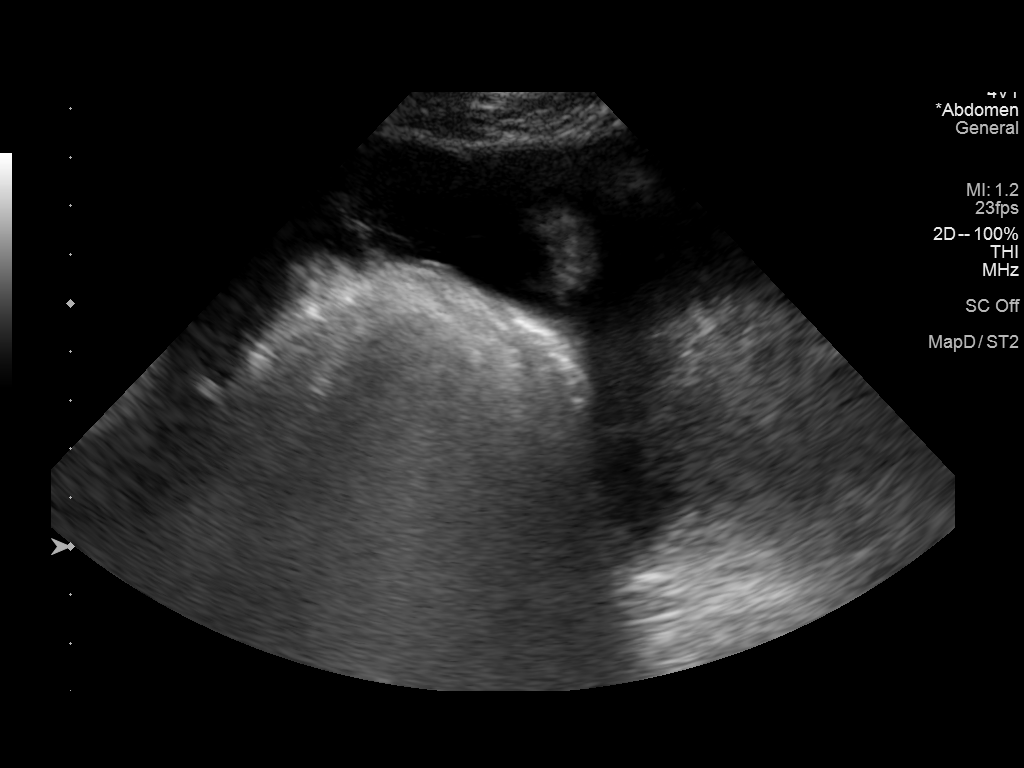
[im 2/5]
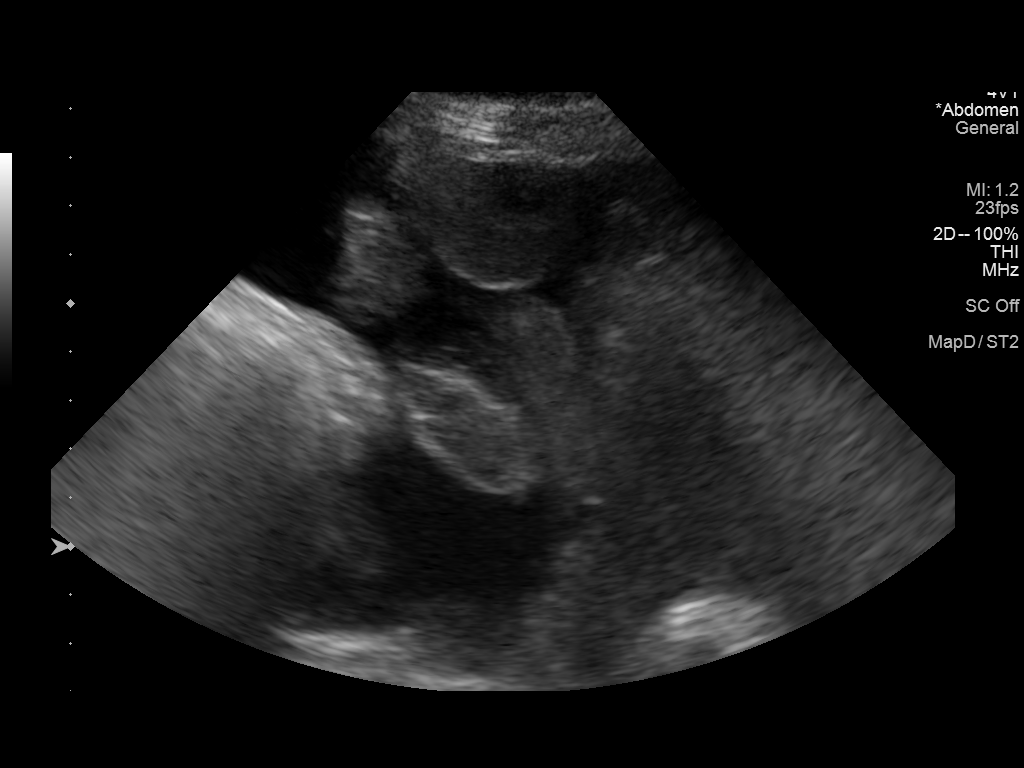
[im 3/5]
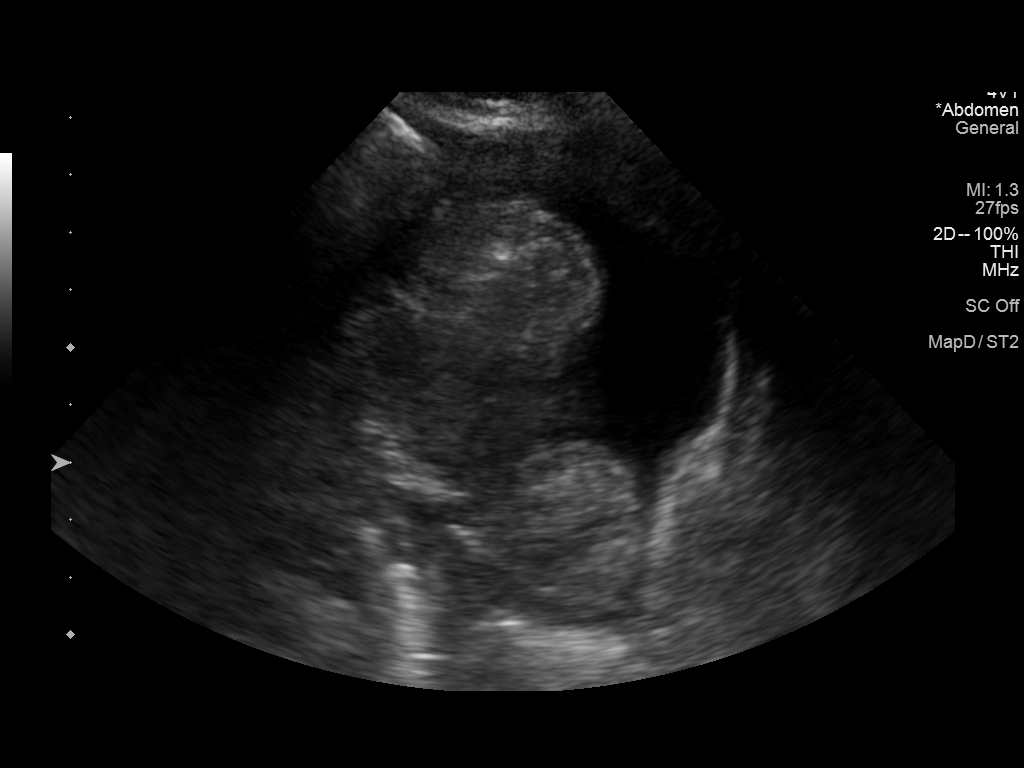
[im 4/5]
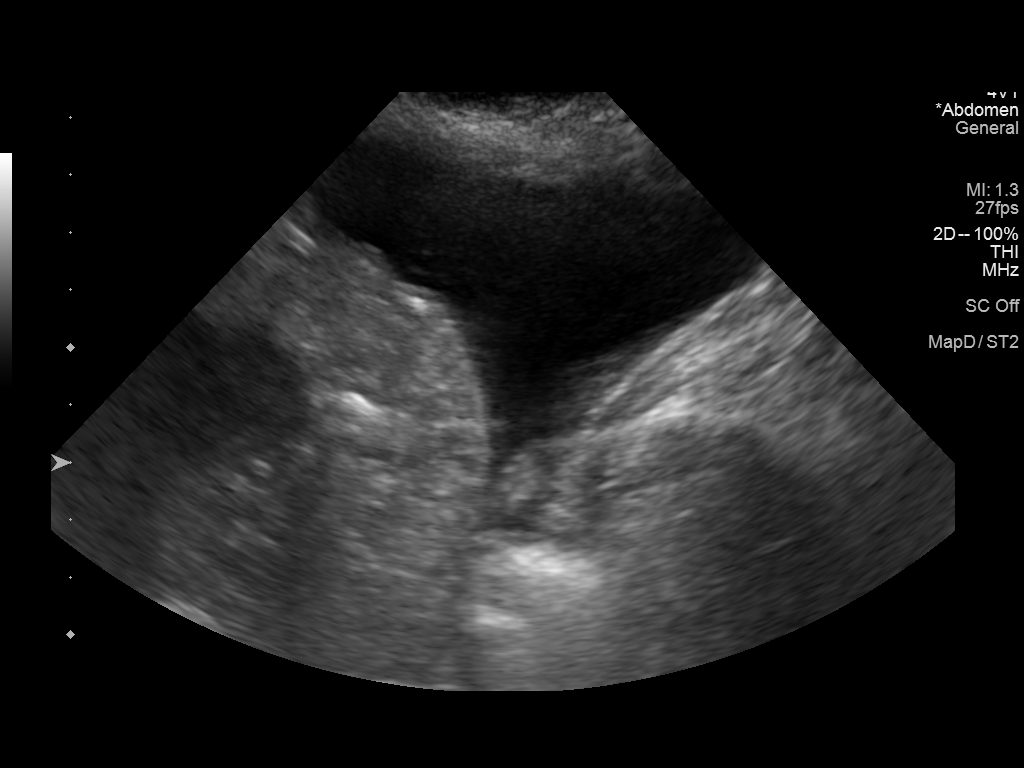
[im 5/5]
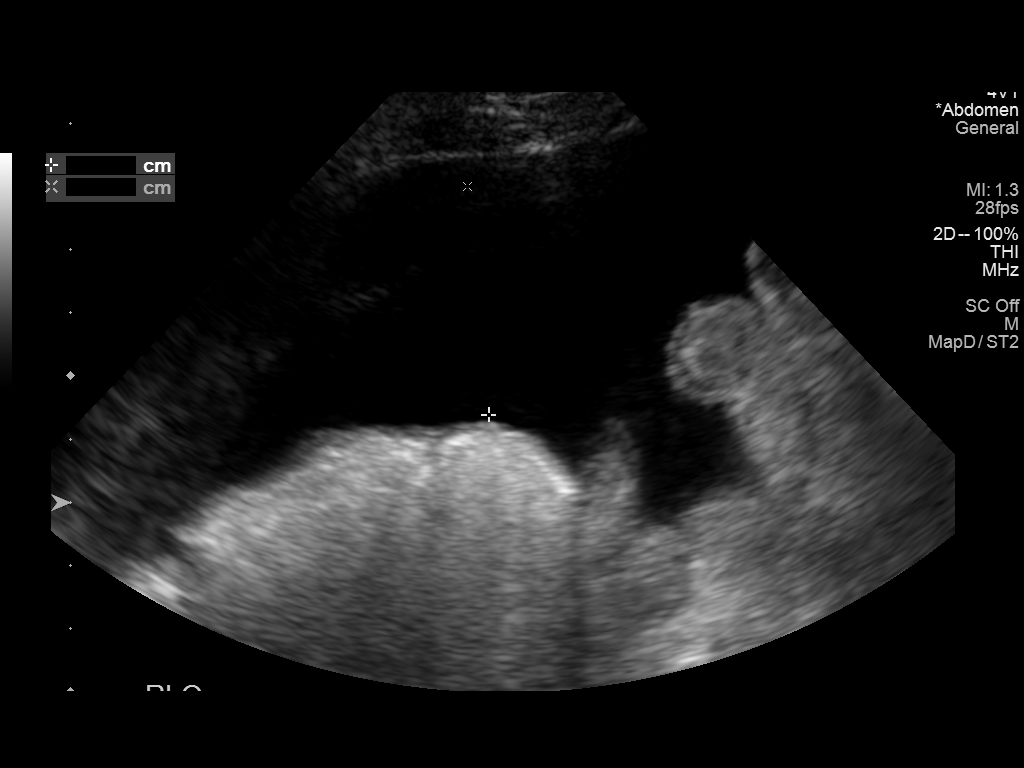

[5 of 5 positions shown; findings below may reference images not displayed]

EXAM:
ULTRASOUND GUIDED THERAPEUTIC PARACENTESIS

MEDICATIONS:
None.

COMPLICATIONS:
None immediate.

PROCEDURE:
Procedure, benefits, and risks of procedure were discussed with
patient.

Written informed consent for procedure was obtained.

Time out protocol followed.

Adequate collection of ascites localized by ultrasound in RIGHT
lower quadrant.

Skin prepped and draped in usual sterile fashion.

Skin and soft tissues anesthetized with 10 mL of 1% lidocaine.

5 [place] Yueh catheter placed into peritoneal cavity.

2.1 L of light yellow ascitic fluid aspirated by vacuum bottle
suction.

Procedure tolerated well by patient without immediate complication.
FINDINGS: As above
IMPRESSION: Successful ultrasound-guided paracentesis yielding 2.1 liters of
peritoneal fluid.

## 2017-01-12 ENCOUNTER — Emergency Department (HOSPITAL_COMMUNITY): Payer: Medicare Other

## 2017-01-12 ENCOUNTER — Encounter (HOSPITAL_COMMUNITY): Payer: Self-pay | Admitting: Emergency Medicine

## 2017-01-12 ENCOUNTER — Inpatient Hospital Stay (HOSPITAL_COMMUNITY)
Admission: EM | Admit: 2017-01-12 | Discharge: 2017-01-19 | DRG: 291 | Disposition: A | Discharge status: Home / Self Care | Payer: Medicare Other | Attending: Internal Medicine | Admitting: Internal Medicine

## 2017-01-12 ENCOUNTER — Inpatient Hospital Stay (HOSPITAL_COMMUNITY): Payer: Medicare Other

## 2017-01-12 DIAGNOSIS — E875 Hyperkalemia: Secondary | ICD-10-CM | POA: Diagnosis not present

## 2017-01-12 DIAGNOSIS — N186 End stage renal disease: Secondary | ICD-10-CM | POA: Diagnosis present

## 2017-01-12 DIAGNOSIS — K7460 Unspecified cirrhosis of liver: Secondary | ICD-10-CM | POA: Diagnosis present

## 2017-01-12 DIAGNOSIS — K7290 Hepatic failure, unspecified without coma: Secondary | ICD-10-CM | POA: Diagnosis present

## 2017-01-12 DIAGNOSIS — D696 Thrombocytopenia, unspecified: Secondary | ICD-10-CM | POA: Diagnosis present

## 2017-01-12 DIAGNOSIS — Z91040 Latex allergy status: Secondary | ICD-10-CM

## 2017-01-12 DIAGNOSIS — K210 Gastro-esophageal reflux disease with esophagitis: Secondary | ICD-10-CM | POA: Diagnosis present

## 2017-01-12 DIAGNOSIS — Z8249 Family history of ischemic heart disease and other diseases of the circulatory system: Secondary | ICD-10-CM | POA: Diagnosis not present

## 2017-01-12 DIAGNOSIS — Z9119 Patient's noncompliance with other medical treatment and regimen: Secondary | ICD-10-CM

## 2017-01-12 DIAGNOSIS — L03116 Cellulitis of left lower limb: Secondary | ICD-10-CM | POA: Diagnosis present

## 2017-01-12 DIAGNOSIS — I959 Hypotension, unspecified: Secondary | ICD-10-CM | POA: Diagnosis not present

## 2017-01-12 DIAGNOSIS — Z992 Dependence on renal dialysis: Secondary | ICD-10-CM

## 2017-01-12 DIAGNOSIS — R040 Epistaxis: Secondary | ICD-10-CM | POA: Diagnosis not present

## 2017-01-12 DIAGNOSIS — N19 Unspecified kidney failure: Secondary | ICD-10-CM

## 2017-01-12 DIAGNOSIS — Z4659 Encounter for fitting and adjustment of other gastrointestinal appliance and device: Secondary | ICD-10-CM | POA: Diagnosis not present

## 2017-01-12 DIAGNOSIS — L139 Bullous disorder, unspecified: Secondary | ICD-10-CM | POA: Diagnosis present

## 2017-01-12 DIAGNOSIS — Z9115 Patient's noncompliance with renal dialysis: Secondary | ICD-10-CM

## 2017-01-12 DIAGNOSIS — R188 Other ascites: Secondary | ICD-10-CM | POA: Diagnosis present

## 2017-01-12 DIAGNOSIS — I132 Hypertensive heart and chronic kidney disease with heart failure and with stage 5 chronic kidney disease, or end stage renal disease: Principal | ICD-10-CM | POA: Diagnosis present

## 2017-01-12 DIAGNOSIS — R680 Hypothermia, not associated with low environmental temperature: Secondary | ICD-10-CM | POA: Diagnosis present

## 2017-01-12 DIAGNOSIS — L03115 Cellulitis of right lower limb: Secondary | ICD-10-CM | POA: Diagnosis present

## 2017-01-12 DIAGNOSIS — E871 Hypo-osmolality and hyponatremia: Secondary | ICD-10-CM | POA: Diagnosis not present

## 2017-01-12 DIAGNOSIS — F319 Bipolar disorder, unspecified: Secondary | ICD-10-CM | POA: Diagnosis present

## 2017-01-12 DIAGNOSIS — K920 Hematemesis: Secondary | ICD-10-CM | POA: Diagnosis not present

## 2017-01-12 DIAGNOSIS — D62 Acute posthemorrhagic anemia: Secondary | ICD-10-CM | POA: Diagnosis not present

## 2017-01-12 DIAGNOSIS — K7581 Nonalcoholic steatohepatitis (NASH): Secondary | ICD-10-CM | POA: Diagnosis present

## 2017-01-12 DIAGNOSIS — K922 Gastrointestinal hemorrhage, unspecified: Secondary | ICD-10-CM | POA: Diagnosis not present

## 2017-01-12 DIAGNOSIS — Z87891 Personal history of nicotine dependence: Secondary | ICD-10-CM | POA: Diagnosis not present

## 2017-01-12 DIAGNOSIS — D631 Anemia in chronic kidney disease: Secondary | ICD-10-CM | POA: Diagnosis present

## 2017-01-12 DIAGNOSIS — Z452 Encounter for adjustment and management of vascular access device: Secondary | ICD-10-CM

## 2017-01-12 DIAGNOSIS — L0390 Cellulitis, unspecified: Secondary | ICD-10-CM

## 2017-01-12 DIAGNOSIS — E8770 Fluid overload, unspecified: Secondary | ICD-10-CM | POA: Diagnosis not present

## 2017-01-12 DIAGNOSIS — K2970 Gastritis, unspecified, without bleeding: Secondary | ICD-10-CM | POA: Diagnosis present

## 2017-01-12 DIAGNOSIS — M898X9 Other specified disorders of bone, unspecified site: Secondary | ICD-10-CM | POA: Diagnosis present

## 2017-01-12 DIAGNOSIS — J9690 Respiratory failure, unspecified, unspecified whether with hypoxia or hypercapnia: Secondary | ICD-10-CM

## 2017-01-12 DIAGNOSIS — Z95828 Presence of other vascular implants and grafts: Secondary | ICD-10-CM

## 2017-01-12 DIAGNOSIS — R4182 Altered mental status, unspecified: Secondary | ICD-10-CM | POA: Diagnosis present

## 2017-01-12 DIAGNOSIS — Z888 Allergy status to other drugs, medicaments and biological substances status: Secondary | ICD-10-CM

## 2017-01-12 DIAGNOSIS — K7682 Hepatic encephalopathy: Secondary | ICD-10-CM

## 2017-01-12 LAB — CBC WITH DIFFERENTIAL/PLATELET
Basophils Absolute: 0 10*3/uL (ref 0.0–0.1)
Basophils Relative: 1 %
EOS PCT: 1 %
Eosinophils Absolute: 0.1 10*3/uL (ref 0.0–0.7)
HEMATOCRIT: 24.7 % — AB (ref 36.0–46.0)
Hemoglobin: 8.1 g/dL — ABNORMAL LOW (ref 12.0–15.0)
LYMPHS PCT: 10 %
Lymphs Abs: 0.7 10*3/uL (ref 0.7–4.0)
MCH: 29.9 pg (ref 26.0–34.0)
MCHC: 32.8 g/dL (ref 30.0–36.0)
MCV: 91.1 fL (ref 78.0–100.0)
MONO ABS: 0.3 10*3/uL (ref 0.1–1.0)
MONOS PCT: 5 %
Neutro Abs: 5.5 10*3/uL (ref 1.7–7.7)
Neutrophils Relative %: 83 %
Platelets: 82 10*3/uL — ABNORMAL LOW (ref 150–400)
RBC: 2.71 MIL/uL — ABNORMAL LOW (ref 3.87–5.11)
RDW: 17.7 % — AB (ref 11.5–15.5)
WBC: 6.6 10*3/uL (ref 4.0–10.5)

## 2017-01-12 LAB — COMPREHENSIVE METABOLIC PANEL
ALT: 15 U/L (ref 14–54)
ANION GAP: 25 — AB (ref 5–15)
AST: 37 U/L (ref 15–41)
Albumin: 2.8 g/dL — ABNORMAL LOW (ref 3.5–5.0)
Alkaline Phosphatase: 93 U/L (ref 38–126)
BILIRUBIN TOTAL: 0.8 mg/dL (ref 0.3–1.2)
BUN: 120 mg/dL — AB (ref 6–20)
CO2: 14 mmol/L — ABNORMAL LOW (ref 22–32)
Calcium: 7.9 mg/dL — ABNORMAL LOW (ref 8.9–10.3)
Chloride: 101 mmol/L (ref 101–111)
Creatinine, Ser: 20.34 mg/dL — ABNORMAL HIGH (ref 0.44–1.00)
GFR, EST AFRICAN AMERICAN: 2 mL/min — AB (ref >60)
GFR, EST NON AFRICAN AMERICAN: 2 mL/min — AB (ref >60)
Glucose, Bld: 121 mg/dL — ABNORMAL HIGH (ref 65–99)
POTASSIUM: 5.7 mmol/L — AB (ref 3.5–5.1)
Sodium: 140 mmol/L (ref 135–145)
TOTAL PROTEIN: 6.3 g/dL — AB (ref 6.5–8.1)

## 2017-01-12 LAB — I-STAT CHEM 8, ED
BUN: 133 mg/dL — AB (ref 6–20)
Calcium, Ion: 0.86 mmol/L — CL (ref 1.15–1.40)
Chloride: 104 mmol/L (ref 101–111)
GLUCOSE: 117 mg/dL — AB (ref 65–99)
HEMATOCRIT: 23 % — AB (ref 36.0–46.0)
Hemoglobin: 7.8 g/dL — ABNORMAL LOW (ref 12.0–15.0)
POTASSIUM: 5.8 mmol/L — AB (ref 3.5–5.1)
Sodium: 135 mmol/L (ref 135–145)
TCO2: 17 mmol/L (ref 0–100)

## 2017-01-12 LAB — BRAIN NATRIURETIC PEPTIDE: B Natriuretic Peptide: 535 pg/mL — ABNORMAL HIGH (ref 0.0–100.0)

## 2017-01-12 LAB — URINALYSIS, ROUTINE W REFLEX MICROSCOPIC
BILIRUBIN URINE: NEGATIVE
Bacteria, UA: NONE SEEN
GLUCOSE, UA: NEGATIVE mg/dL
Ketones, ur: NEGATIVE mg/dL
Leukocytes, UA: NEGATIVE
NITRITE: NEGATIVE
PROTEIN: 30 mg/dL — AB
SPECIFIC GRAVITY, URINE: 1.013 (ref 1.005–1.030)
pH: 6 (ref 5.0–8.0)

## 2017-01-12 LAB — RAPID URINE DRUG SCREEN, HOSP PERFORMED
Amphetamines: NOT DETECTED
Barbiturates: NOT DETECTED
Benzodiazepines: POSITIVE — AB
COCAINE: NOT DETECTED
OPIATES: NOT DETECTED
TETRAHYDROCANNABINOL: NOT DETECTED

## 2017-01-12 LAB — AMMONIA: AMMONIA: 311 umol/L — AB (ref 9–35)

## 2017-01-12 LAB — APTT: APTT: 34 s (ref 24–36)

## 2017-01-12 LAB — I-STAT BETA HCG BLOOD, ED (MC, WL, AP ONLY): I-stat hCG, quantitative: <5 m[IU]/mL (ref <5)

## 2017-01-12 LAB — PROTIME-INR
INR: 1.19
PROTHROMBIN TIME: 15.2 s (ref 11.4–15.2)

## 2017-01-12 LAB — ACETAMINOPHEN LEVEL

## 2017-01-12 LAB — SALICYLATE LEVEL: Salicylate Lvl: <7 mg/dL (ref 2.8–30.0)

## 2017-01-12 LAB — TROPONIN I: Troponin I: <0.03 ng/mL (ref <0.03)

## 2017-01-12 LAB — LIPASE, BLOOD: LIPASE: 64 U/L — AB (ref 11–51)

## 2017-01-12 LAB — LACTIC ACID, PLASMA: Lactic Acid, Venous: 1.9 mmol/L (ref 0.5–1.9)

## 2017-01-12 LAB — I-STAT CG4 LACTIC ACID, ED: Lactic Acid, Venous: 2.4 mmol/L (ref 0.5–1.9)

## 2017-01-12 LAB — ETHANOL: Alcohol, Ethyl (B): <5 mg/dL (ref <5)

## 2017-01-12 MED ORDER — VANCOMYCIN HCL IN DEXTROSE 1-5 GM/200ML-% IV SOLN
1000.0000 mg | Freq: Once | INTRAVENOUS | Status: AC
  Administered 2017-01-13: 1000 mg via INTRAVENOUS
  Filled 2017-01-12: qty 200

## 2017-01-12 MED ORDER — PIPERACILLIN-TAZOBACTAM 3.375 G IVPB 30 MIN
3.3750 g | Freq: Once | INTRAVENOUS | Status: AC
  Administered 2017-01-13: 3.375 g via INTRAVENOUS
  Filled 2017-01-12: qty 50

## 2017-01-12 MED ORDER — LACTULOSE 10 GM/15ML PO SOLN
30.0000 g | Freq: Once | ORAL | Status: AC
  Administered 2017-01-13: 30 g
  Filled 2017-01-12: qty 60

## 2017-01-12 NOTE — ED Notes (Signed)
Attempted to contact aunt listed on facesheet, Oletha Cruel, at [phone], no answer, left voicemail requesting call back

## 2017-01-12 NOTE — ED Notes (Signed)
Unable to obtain IV access. [place] [facility] access called to start central line.

## 2017-01-12 NOTE — ED Notes (Signed)
Per verbal order by Dr. Darrick Meigs, applied wet to dry dressings and covered purulent draining wounds to both legs from knee down

## 2017-01-12 NOTE — ED Provider Notes (Signed)
[facility] DEPT Provider Note   CSN: [id number] Arrival date & time: 01/12/17  1602     History   Chief Complaint Chief Complaint  Patient presents with  . Altered Mental Status    HPI Wanda [name] is a 41 y.o. female.  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 She is unresponsive and not answering questions. Level V caveat applies. Has not had dialysis at dialysis clinic for the past 2 weeks. Home visit and found patient lying in bed and unresponsive. EMS was called. Patient's boyfriend was evasive regarding direct answers. He seems to indicate the patient was ambulatory and normal state of health yesterday. Past Medical History:  Diagnosis Date  . Acute blood loss anemia 02/25/2014   Status post transfusion  . Acute renal failure ([place]) 09/2013   Pre-renal- resolved  . Anasarca 10/10/2013  . Anxiety   . Bipolar disorder ([place]) 12/04/2013   2007-SEEN IN ED FOR INVOLUNTARY COMMITMENT, UDS POS FOR AMPHETAMINES/OPIATES   . Bleeding esophageal varices ([place]) 02/28/2014   s/p banding  . C. difficile colitis 04/19/2014  . Chronic hypotension   . Cirrhosis ([place]) 10/05/13   Liver bx 11/23/13 (delayed initially due to patient refusal). c/w steatohepatitis  . Cirrhosis of liver with ascites ([place])   . Depression   . ESRD (end stage renal disease) on dialysis ([place]) 08/2014  . Folate deficiency 09/2013  . Gastroesophageal junction ulcer 09/17/2014  . GERD (gastroesophageal reflux disease)   . Hematemesis/vomiting blood 02/24/2014  . Macrocytosis 02/28/2014  . PNA (pneumonia) 10/13/2013  . SBP (spontaneous bacterial peritonitis) ([place]) 11/10/2013  . Thrombocytopenia ([place])    Hypercellular bone marrow; abundant megakaryocytes per 08/27/2014; s/p bone marrow bx    Patient Active Problem List   Diagnosis Date Noted  . Altered mental status 01/12/2017  . Cellulitis 01/12/2017  . Bullous skin disease 12/22/2016  . ESRD (end stage renal  disease) ([place]) 12/13/2016  . Volume overload 12/13/2016  . History of noncompliance with medical treatment   . Liver cirrhosis secondary to NASH (nonalcoholic steatohepatitis) ([place])   . Palliative care encounter   . Pulmonary edema 11/20/2016  . Hypocalcemia 11/20/2016  . Cardiomyopathy- ? ischemic  10/09/2016  . Troponin level elevated 10/09/2016  . Goals of care, counseling/discussion   . Palliative care by specialist   . Seizure ([place]) 10/07/2016  . Pressure injury of skin 05/20/2016  . Shock ([place]) 05/19/2016  . Rhonchi   . Chronic pain syndrome 08/22/2015  . Abdominal pain 08/07/2015  . Umbilical hernia without obstruction and without gangrene 08/07/2015  . GI bleed   . Acute blood loss anemia   . Hypotension 06/05/2015  . Edema of left lower extremity 02/22/2015  . Ascites of liver   . History of esophageal varices with bleeding 09/16/2014  . End stage renal disease on dialysis ([place])   . Anemia of chronic disease   . Thrombocytopenia ([place]) 08/19/2014  . Hyponatremia 08/17/2014  . Esophageal varices without bleeding ([place])   . Portal hypertensive gastropathy ([place])   . Malnutrition of moderate degree ([place]) 05/15/2014  . Bipolar disorder ([employer]) 12/04/2013  . SBP (spontaneous bacterial peritonitis) ([place]) 11/10/2013  . Liver cirrhosis secondary to nonalcoholic steatohepatitis (NASH) ([place]) 11/07/2013  . Folate deficiency 10/13/2013  . Anasarca 10/10/2013  . Anemia 10/06/2013    Past Surgical History:  Procedure Laterality Date  . AV FISTULA PLACEMENT Right 11/16/2014   Procedure: Right arm Creation of arteriovenous fistula;  Surgeon: Harrell Gave  Nicole Cella, MD;  Location: [facility];  Service: Vascular;  Laterality: Right;  . CENTRAL VENOUS CATHETER INSERTION Right   . COLONOSCOPY N/A 12/19/2013   SLF:NO OBVIOUS SOURCE FOR ANEMIA IDETIFIED/ONE COLON POLYP REMOVED/Small internal hemorrhoids  . ESOPHAGEAL BANDING  07/04/2014   Procedure: ESOPHAGEAL BANDING;  Surgeon: Daneil Dolin,  MD;  [facility];  Service: Endoscopy;;  . ESOPHAGEAL BANDING N/A 07/24/2014   Procedure: ESOPHAGEAL BANDING (2 bands applied);  Surgeon: Danie Binder, MD;  Location: [facility];  Service: Endoscopy;  Laterality: N/A;  . ESOPHAGEAL BANDING N/A 07/23/2015   Procedure: ESOPHAGEAL BANDING;  Surgeon: Danie Binder, MD;  [facility];  Service: Endoscopy;  Laterality: N/A;  . ESOPHAGEAL BANDING N/A 03/04/2016   Procedure: ESOPHAGEAL BANDING;  Surgeon: Danie Binder, MD;  [facility];  Service: Endoscopy;  Laterality: N/A;  . ESOPHAGEAL BANDING N/A 04/30/2016   Procedure: ESOPHAGEAL BANDING;  Surgeon: Daneil Dolin, MD;  [facility];  Service: Endoscopy;  Laterality: N/A;  . ESOPHAGOGASTRODUODENOSCOPY N/A 11/14/2013   SLF:1 column of very small varices in distal esopahgus/MODERATE PORTAL GASTROPATHY IN PROXIMAL STOMACH/MODERATE erosive gastritis  . ESOPHAGOGASTRODUODENOSCOPY N/A 02/11/2014   Dr. Rourk:Esophageal varices with bleeding stigmata-status post esophageal band ligation therapy. Portal gastropathy  . ESOPHAGOGASTRODUODENOSCOPY N/A 07/04/2014   RMR: Persiting grade 2 esophageal varicies with bleeding stigmata status post band ligation. Significantly congested gastric mucosa iwith changes constistant with protal gastropathy.   . ESOPHAGOGASTRODUODENOSCOPY N/A 09/16/2014   Rehman: Single short column of varix proximal to GE junction not large enough to be banded. Two amall ulcers at the GEJ felt to be source of GI Bleeding but no active bleeding but no actibe bleeding noted. No therapy rendered. Portal gastropathy NO evidence of peptic ulcer diease or gastric varices.   . ESOPHAGOGASTRODUODENOSCOPY N/A 12/14/2014   SLF: Grade ! esophageal varices. 2. Moderate Portal Gastropathy  . ESOPHAGOGASTRODUODENOSCOPY N/A 03/27/2015   Procedure: ESOPHAGOGASTRODUODENOSCOPY (EGD);  Surgeon: Danie Binder, MD;  [facility];  Service: Endoscopy;  Laterality:  N/A;  1045am - moved to 817 @ 11:30  . ESOPHAGOGASTRODUODENOSCOPY N/A 06/05/2015   Procedure: ESOPHAGOGASTRODUODENOSCOPY (EGD);  Surgeon: Clarene Essex, MD;  Location: [facility] ENDOSCOPY;  Service: Endoscopy;  Laterality: N/A;  . ESOPHAGOGASTRODUODENOSCOPY N/A 07/23/2015   SLF:1. Grade 1 esophageal varices 2. Moderate portal hypertensive gastropathy 3. MILd non-erosive gastritis.   [name] ESOPHAGOGASTRODUODENOSCOPY N/A 03/04/2016   Dr. Oneida Alar: grade 1 varices, surveillance in Jan 2017   . ESOPHAGOGASTRODUODENOSCOPY N/A 05/21/2016   Dr. Carlean Purl: non-bleeding esophageal ulcer, likely secondary to MW tear  . ESOPHAGOGASTRODUODENOSCOPY (EGD) WITH PROPOFOL N/A 07/24/2014   SLF:  1. 2 columns grade 2-3 varices- 2 Bands applied.  2.  Moderate gastropathy 3. Duodenal Diverticula  . ESOPHAGOGASTRODUODENOSCOPY (EGD) WITH PROPOFOL N/A 10/26/2014   RMR: 2 columns of grade 2 esophageal varices without obvious bleeding stigmata status post band ligation to complet obliteration of remaining varices.   . ESOPHAGOGASTRODUODENOSCOPY (EGD) WITH PROPOFOL N/A 04/30/2016   Dr. Gala Romney: MW tear, varices obliterated, portal gastropathy  . None    . PARACENTESIS  Feb 2015   1180 fluid, negative fluid analysis.   [name] PARACENTESIS  10/2013    OB History    Gravida Para Term Preterm AB Living   _0 SAB TAB Ectopic Multiple Live Births  Home Medications    Prior to Admission medications   Medication Sig Start Date End Date Taking? Authorizing Provider  augmented betamethasone dipropionate (DIPROLENE-AF) 0.05 % cream APPLY TO THE AFFECTED AREAS ON LEGS TWICE DAILY AS NEEDED (NOT FACE, GROIN, OR UNDER ARMS) 12/24/16  Yes [provider]  clotrimazole (LOTRIMIN) 1 % cream Apply 1 application topically 3 (three) times daily. 12/25/16  Yes Annitta Needs, NP  lidocaine (XYLOCAINE) 2 % solution TAKE TWO TEASPOONSFUL (10ML) BY MOUTH BEFORE MEALS AND AT BEDTIME TO PREVENT CHEST PAIN WHILE EATING 12/25/16   Yes Annitta Needs, NP  LORazepam (ATIVAN) 1 MG tablet Take 1 mg by mouth 2 (two) times daily as needed for anxiety (and/or back spasms).   Yes [provider]  midodrine (PROAMATINE) 5 MG tablet TAKE TWO (2) TABLETS BY MOUTH DAILY. TAKE ONLY ON DIALYSIS DAYS ON TUESDAYS, THURDAYS, AND SATURDAYS (SOMETIME TAKES TREATMENTS ON FRIDAY IN 12/29/16  Yes [provider]  Oxycodone HCl 10 MG TABS Take 0.5 tablets (5 mg total) by mouth 3 (three) times daily as needed. 1 PO TID AS NEEDED FOR PAIN Patient taking differently: Take 5-10 mg by mouth 3 (three) times daily as needed.  09/27/16  Yes Cherene Altes, MD  Darbepoetin Alfa (ARANESP) 60 MCG/0.3ML SOSY injection Inject 0.3 mLs (60 mcg total) into the vein every Thursday with hemodialysis. Patient taking differently: Inject 60 mcg into the vein every Tuesday with hemodialysis.  06/09/15   Cherene Altes, MD  lactulose (CHRONULAC) 10 GM/15ML solution Take 15-30 mLs (10-20 g total) by mouth 3 (three) times daily. TAKE 15-30 ml BY MOUTH three times a day 12/21/16   Samuella Cota, MD  lidocaine-prilocaine (EMLA) cream Apply 1 application topically as needed (for dialysis treatments).    [provider]  midodrine (PROAMATINE) 10 MG tablet Take 1 tablet (10 mg total) by mouth 3 (three) times daily. 12/21/16   Samuella Cota, MD  omeprazole (PRILOSEC) 20 MG capsule 1 po every morning 30 minutes prior to your first meal. Patient taking differently: Take 20 mg by mouth daily before breakfast. 1 po every morning 30 minutes prior to your first meal. 09/30/16   Fields, Marga Melnick, MD  RENVELA 800 MG tablet Take 1600 mg by mouth 3 times daily with meals. Take 800 mg by mouth with snacks. 10/29/15   [provider]  sucralfate (CARAFATE) 1 GM/10ML suspension Take 1 g by mouth 4 (four) times daily -  with meals and at bedtime.    [provider]    Family History Family History  Problem Relation Age of Onset  .  Heart disease Mother   . Colon cancer Neg Hx   . Liver disease Neg Hx     Social History Social History  Substance Use Topics  . Smoking status: Former Smoker    Packs/day: 0.25    Years: 20.00    Types: Cigarettes    Quit date: 11/05/2008  . Smokeless tobacco: Never Used     Comment: Never really smoked much  . Alcohol use No     Allergies   Iohexol; Lasix [furosemide]; and Latex   Review of Systems Review of Systems  Unable to perform ROS: Mental status change     Physical Exam Updated Vital Signs BP (!) 149/73   Pulse (!) 118   Temp 99.3 F (37.4 C)   Resp 13   Ht _0  (1.549 m)   Wt 72.5 kg (159 lb 13.3  oz)   LMP 09/21/2013   SpO2 100%   BMI 30.20 kg/m   Physical Exam  Constitutional: She appears well-developed.  Disheveled, ill-appearing. Patient with stigmata of end-stage liver disease  HENT:  Head: Normocephalic and atraumatic.  Eyes: EOM are normal. Pupils are equal, round, and reactive to light.  Pupils are bilaterally dilated and reactive though patient is fighting exam  Neck: Normal range of motion. Neck supple.  Cardiovascular: Regular rhythm.   Tachycardia  Pulmonary/Chest: She has rales.  Rhonchi in all lung fields. She appears to be protecting airway.  Abdominal: Soft. Bowel sounds are normal. There is no tenderness. There is no rebound and no guarding.  Abdomen is severely distended. She has umbilical hernia that is tender to palpation. There is the abdomen does not appear to be tender. Fluid wave. Cadput medusae noted.   Musculoskeletal: Normal range of motion. She exhibits edema. She exhibits no tenderness.  Bulla to bilateral lower extremities. Some are open and dried especially on the left. Anteriorly on the right in the pretibial space there is bulla filled with purulent fluid. Mild surrounding warmth overlying skin erythema.  Neurological:  Patient is mildly combative. Appears to be moving all extremities. Not following commands. Lying  with her eyes closed. Nonverbal. Asterixis noted  Skin: Skin is warm and dry. No rash noted. No erythema.  Jaundiced  Nursing note and vitals reviewed.    ED Treatments / Results  Labs (all labs ordered are listed, but only abnormal results are displayed) Labs Reviewed  CULTURE, BLOOD (ROUTINE X 2) - Abnormal; Notable for the following:       Result Value   Culture   (*)    Value: STAPHYLOCOCCUS SPECIES (COAGULASE NEGATIVE) THE SIGNIFICANCE OF ISOLATING THIS ORGANISM FROM A SINGLE SET OF BLOOD CULTURES WHEN MULTIPLE SETS ARE DRAWN IS UNCERTAIN. PLEASE NOTIFY THE MICROBIOLOGY DEPARTMENT WITHIN ONE WEEK IF SPECIATION AND SENSITIVITIES ARE REQUIRED. Performed at [facility], [place] [address], [place],  [address]    All other components within normal limits  BLOOD CULTURE ID PANEL (REFLEXED) - Abnormal; Notable for the following:    Staphylococcus species DETECTED (*)    Methicillin resistance DETECTED (*)    All other components within normal limits  CBC WITH DIFFERENTIAL/PLATELET - Abnormal; Notable for the following:    RBC 2.71 (*)    Hemoglobin 8.1 (*)    HCT 24.7 (*)    RDW 17.7 (*)    Platelets 82 (*)    All other components within normal limits  COMPREHENSIVE METABOLIC PANEL - Abnormal; Notable for the following:    Potassium 5.7 (*)    CO2 14 (*)    Glucose, Bld 121 (*)    BUN 120 (*)    Creatinine, Ser 20.34 (*)    Calcium 7.9 (*)    Total Protein 6.3 (*)    Albumin 2.8 (*)    GFR calc non Af Amer 2 (*)    GFR calc Af Amer 2 (*)    Anion gap 25 (*)    All other components within normal limits  BRAIN NATRIURETIC PEPTIDE - Abnormal; Notable for the following:    B Natriuretic Peptide 535.0 (*)    All other components within normal limits  LIPASE, BLOOD - Abnormal; Notable for the following:    Lipase 64 (*)    All other components within normal limits  AMMONIA - Abnormal; Notable for the following:    Ammonia 311 (*)    All  other components within  normal limits  ACETAMINOPHEN LEVEL - Abnormal; Notable for the following:    Acetaminophen (Tylenol), Serum <10 (*)    All other components within normal limits  RAPID URINE DRUG SCREEN, HOSP PERFORMED - Abnormal; Notable for the following:    Benzodiazepines POSITIVE (*)    All other components within normal limits  URINALYSIS, ROUTINE W REFLEX MICROSCOPIC - Abnormal; Notable for the following:    Hgb urine dipstick SMALL (*)    Protein, ur 30 (*)    Squamous Epithelial / LPF 0-5 (*)    All other components within normal limits  CBC - Abnormal; Notable for the following:    RBC 2.74 (*)    Hemoglobin 8.3 (*)    HCT 24.8 (*)    RDW 17.6 (*)    Platelets 110 (*)    All other components within normal limits  COMPREHENSIVE METABOLIC PANEL - Abnormal; Notable for the following:    Potassium 5.8 (*)    CO2 14 (*)    BUN 132 (*)    Creatinine, Ser 20.48 (*)    Calcium 8.0 (*)    Albumin 3.0 (*)    GFR calc non Af Amer 2 (*)    GFR calc Af Amer 2 (*)    Anion gap 25 (*)    All other components within normal limits  AMMONIA - Abnormal; Notable for the following:    Ammonia 205 (*)    All other components within normal limits  BLOOD GAS, ARTERIAL - Abnormal; Notable for the following:    pH, Arterial 7.267 (*)    pCO2 arterial 31.4 (*)    pO2, Arterial 295 (*)    Bicarbonate 15.3 (*)    Acid-base deficit 11.7 (*)    All other components within normal limits  PROTIME-INR - Abnormal; Notable for the following:    Prothrombin Time 15.7 (*)    All other components within normal limits  GLUCOSE, CAPILLARY - Abnormal; Notable for the following:    Glucose-Capillary 125 (*)    All other components within normal limits  GLUCOSE, CAPILLARY - Abnormal; Notable for the following:    Glucose-Capillary 138 (*)    All other components within normal limits  CBC WITH DIFFERENTIAL/PLATELET - Abnormal; Notable for the following:    WBC 14.9 (*)    RBC 2.83 (*)    Hemoglobin 8.4 (*)    HCT  25.4 (*)    RDW 17.8 (*)    Neutro Abs 12.9 (*)    All other components within normal limits  GLUCOSE, CAPILLARY - Abnormal; Notable for the following:    Glucose-Capillary 108 (*)    All other components within normal limits  GLUCOSE, CAPILLARY - Abnormal; Notable for the following:    Glucose-Capillary 121 (*)    All other components within normal limits  GLUCOSE, CAPILLARY - Abnormal; Notable for the following:    Glucose-Capillary 115 (*)    All other components within normal limits  CBC WITH DIFFERENTIAL/PLATELET - Abnormal; Notable for the following:    RBC 2.09 (*)    Hemoglobin 6.2 (*)    HCT 19.5 (*)    RDW 18.3 (*)    Platelets 110 (*)    All other components within normal limits  COMPREHENSIVE METABOLIC PANEL - Abnormal; Notable for the following:    Glucose, Bld 125 (*)    BUN 69 (*)    Creatinine, Ser 12.10 (*)    Total Protein 6.1 (*)  Albumin 2.7 (*)    Total Bilirubin 1.4 (*)    GFR calc non Af Amer 3 (*)    GFR calc Af Amer 4 (*)    Anion gap 19 (*)    All other components within normal limits  BLOOD GAS, ARTERIAL - Abnormal; Notable for the following:    pH, Arterial 7.481 (*)    pCO2 arterial 29.5 (*)    pO2, Arterial 127 (*)    All other components within normal limits  GLUCOSE, CAPILLARY - Abnormal; Notable for the following:    Glucose-Capillary 115 (*)    All other components within normal limits  GLUCOSE, CAPILLARY - Abnormal; Notable for the following:    Glucose-Capillary 121 (*)    All other components within normal limits  I-STAT CG4 LACTIC ACID, ED - Abnormal; Notable for the following:    Lactic Acid, Venous 2.40 (*)    All other components within normal limits  I-STAT CHEM 8, ED - Abnormal; Notable for the following:    Potassium 5.8 (*)    BUN 133 (*)    Creatinine, Ser >18.00 (*)    Glucose, Bld 117 (*)    Calcium, Ion 0.86 (*)    Hemoglobin 7.8 (*)    HCT 23.0 (*)    All other components within normal limits  CULTURE, BLOOD  (ROUTINE X 2)  AEROBIC CULTURE (SUPERFICIAL SPECIMEN)  MRSA PCR SCREENING  PROTIME-INR  TROPONIN I  SALICYLATE LEVEL  ETHANOL  APTT  LACTIC ACID, PLASMA  GLUCOSE, CAPILLARY  AMMONIA  I-STAT BETA HCG BLOOD, ED ([facility], [facility], [facility] ONLY)  TYPE AND SCREEN  PREPARE RBC (CROSSMATCH)  PREPARE PLATELET PHERESIS    EKG  EKG Interpretation None       Radiology Ct Head Wo Contrast  Result Date: 01/13/2017 CLINICAL DATA:  Altered mental status.  Ventilator dependence. EXAM: CT HEAD WITHOUT CONTRAST TECHNIQUE: Contiguous axial images were obtained from the base of the skull through the vertex without intravenous contrast. COMPARISON:  09/29/2016 FINDINGS: Brain: There is no evidence for acute hemorrhage, hydrocephalus, mass lesion, or abnormal extra-axial fluid collection. No definite CT evidence for acute infarction. Vascular: No hyperdense vessel or unexpected calcification. Skull: No evidence for fracture. No worrisome lytic or sclerotic lesion. Sinuses/Orbits: Mucosal disease in the ethmoid air cells and maxillary sinus has progressed in the interval. Visualized portions of the globes and intraorbital fat are unremarkable. Other: None. IMPRESSION: 1. No acute intracranial abnormality. 2. Interval development of paranasal sinus disease, nonspecific in the setting of intubation. Electronically Signed   By: Misty Stanley M.D.   On: 01/13/2017 11:33   Dg Chest 1v Repeat Same Day  Result Date: 01/12/2017 CLINICAL DATA:  PICC line placement EXAM: CHEST - 1 VIEW SAME DAY COMPARISON:  1959 hours on the same day FINDINGS: Left-sided PICC line tip projects over the expected location of the proximal right atrium. Pullback approximately 2 cm. Stable cardiomegaly with aortic atherosclerosis and mild CHF. Low lung volumes are noted. No acute nor suspicious osseous lesions. IMPRESSION: 1. Tip of left-sided PICC line tip projects over the proximal right atrium. Pullback approximately 2 cm. 2. Cardiomegaly with aortic  atherosclerosis and mild CHF. Electronically Signed   By: Ashley Royalty M.D.   On: 01/12/2017 22:44   Dg Chest Port 1 View  Result Date: 01/14/2017 CLINICAL DATA:  Respiratory failure EXAM: PORTABLE CHEST 1 VIEW COMPARISON:  01/13/2017 FINDINGS: ET tube tip is above the carina. There is a left arm PICC line with tip  in the cavoatrial junction. OG tube tip is in the stomach. Aortic atherosclerosis. Normal heart size. Decrease in left pleural effusion and pulmonary vascular congestion. IMPRESSION: 1. Decrease in left pleural effusion and pulmonary vascular congestion. Electronically Signed   By: Kerby Moors M.D.   On: 01/14/2017 09:01   Dg Chest Port 1 View  Result Date: 01/13/2017 CLINICAL DATA:  Status post orogastric tube placement. EXAM: PORTABLE CHEST 1 VIEW COMPARISON:  Chest x-ray of earlier today. FINDINGS: The orogastric tube tip projects at approximately the level of the pylorus or proximal duodenum. The proximal port projects in the distal gastric body. There are loops of mildly distended gas-filled small bowel in the left mid abdomen. IMPRESSION: Reasonable positioning of the orogastric tube. Electronically Signed   By: Zimere Dunlevy  [place] M.D.   On: 01/13/2017 09:13   Dg Chest Portable 1 View  Result Date: 01/13/2017 CLINICAL DATA:  Intubation. EXAM: PORTABLE CHEST 1 VIEW COMPARISON:  01/12/2017. FINDINGS: Endotracheal tube tip 1.9 cm above the lower portion of the carina. NG tube tip below left hemidiaphragm. Left PICC line in stable position. Cardiomegaly with pulmonary vascular congestion again noted without interim change. Low lung volumes with mild basilar atelectasis. No pneumothorax. IMPRESSION: 1. Endotracheal tube tip 1.9 cm above the lower portion of the carina. NG tube tip below left hemidiaphragm. Left PICC line stable position. 2. Cardiomegaly with pulmonary vascular congestion again noted. No interim change. 3. Low lung volumes with basilar atelectasis. Electronically Signed   By: Marcello Moores   Register   On: 01/13/2017 06:41   Dg Chest Port 1 View  Result Date: 01/12/2017 CLINICAL DATA:  Altered mentation and wheeze EXAM: PORTABLE CHEST 1 VIEW COMPARISON:  12/22/2016 FINDINGS: Borderline cardiomegaly with aortic atherosclerosis. Slight interval increase in pulmonary vascular congestion consistent mild CHF. No pneumonic consolidation, effusion or pneumothorax. Minimal atelectasis and/or scarring at the right lung base. No acute nor suspicious osseous lesions. IMPRESSION: Interval increase in pulmonary vascular congestion consistent with mild CHF. Stable aortic atherosclerosis Electronically Signed   By: Ashley Royalty M.D.   On: 01/12/2017 17:57   Dg Chest Port 1v Same Day  Result Date: 01/12/2017 CLINICAL DATA:  PICC line placement EXAM: PORTABLE CHEST 1 VIEW COMPARISON:  01/12/2017 FINDINGS: Left upper extremity catheter tip directed cephalad. Stable borderline cardiomegaly. Mild central vascular calcification. No focal consolidation or effusion. Aortic atherosclerosis. IMPRESSION: 1. Left upper extremity catheter tip directed cephalad, possibly within the azygos system. Repositioning suggested 2. Cardiomegaly with slight worsens in central vascular congestion. Electronically Signed   By: Donavan Foil M.D.   On: 01/12/2017 20:22    Procedures Procedures (including critical care time)  Medications Ordered in ED Medications  sodium chloride flush (NS) 0.9 % injection 3 mL (3 mLs Intravenous Not Given 01/14/17 1000)  ondansetron (ZOFRAN) tablet 4 mg ( Oral MAR Unhold 01/13/17 0843)    Or  ondansetron (ZOFRAN) injection 4 mg ( Intravenous MAR Unhold 01/13/17 0843)  etomidate (AMIDATE) 2 MG/ML injection (  Not Given 01/13/17 1004)  vecuronium (NORCURON) 10 MG injection (  Not Given 01/13/17 1004)  midazolam (VERSED) 5 MG/5ML injection (0 mg  Hold 01/13/17 0851)  meperidine (DEMEROL) 100 MG/ML injection (0 mg  Hold 01/13/17 0851)  lidocaine (XYLOCAINE) 2 % viscous mouth solution (0 mLs  Hold 01/13/17  0851)  piperacillin-tazobactam (ZOSYN) IVPB 3.375 g (3.375 g Intravenous New Bag/Given 01/14/17 1022)  pantoprazole (PROTONIX) injection 40 mg (40 mg Intravenous Given 01/14/17 0758)  0.9 %  sodium chloride infusion ( Intravenous  Hold 01/13/17 0948)  pentafluoroprop-tetrafluoroeth (GEBAUERS) aerosol 1 application (not administered)  lidocaine (PF) (XYLOCAINE) 1 % injection 5 mL (not administered)  lidocaine-prilocaine (EMLA) cream 1 application (not administered)  0.9 %  sodium chloride infusion (not administered)  0.9 %  sodium chloride infusion (not administered)  heparin injection 1,000 Units (not administered)  alteplase (CATHFLO ACTIVASE) injection 2 mg (not administered)  epoetin alfa (EPOGEN,PROCRIT) injection 10,000 Units (10,000 Units Subcutaneous Given 01/13/17 1242)  chlorhexidine gluconate (MEDLINE KIT) (PERIDEX) 0.12 % solution 15 mL (15 mLs Mouth Rinse Given 01/14/17 0801)  MEDLINE mouth rinse (15 mLs Mouth Rinse Given 01/14/17 1200)  sodium chloride flush (NS) 0.9 % injection 10-40 mL (10 mLs Intracatheter Given 01/14/17 1000)  sodium chloride flush (NS) 0.9 % injection 10-40 mL (not administered)  Chlorhexidine Gluconate Cloth 2 % PADS 6 each (6 each Topical Given 01/14/17 1000)  fentaNYL (SUBLIMAZE) injection 50 mcg (50 mcg Intravenous Not Given 01/13/17 1810)  fentaNYL (SUBLIMAZE) 2,500 mcg in sodium chloride 0.9 % 250 mL (10 mcg/mL) infusion (300 mcg/hr Intravenous New Bag/Given 01/14/17 1202)  fentaNYL (SUBLIMAZE) bolus via infusion 50 mcg (50 mcg Intravenous Bolus from Bag 01/13/17 1911)  lactulose (CHRONULAC) 10 GM/15ML solution 30 g (30 g Per Tube Given 01/14/17 0758)  vancomycin (VANCOCIN) IVPB 750 mg/150 ml premix (not administered)  vancomycin (VANCOCIN) IVPB 1000 mg/200 mL premix (0 mg Intravenous Stopped 01/13/17 0133)  piperacillin-tazobactam (ZOSYN) IVPB 3.375 g (0 g Intravenous Stopped 01/13/17 0104)  lactulose (CHRONULAC) 10 GM/15ML solution 30 g (30 g Per Tube Given 01/13/17 0032)    vancomycin (VANCOCIN) IVPB 750 mg/150 ml premix (750 mg Intravenous Not Given 01/13/17 1709)  0.9 %  sodium chloride infusion ( Intravenous New Bag/Given 01/14/17 1022)  diphenhydrAMINE (BENADRYL) injection 25 mg (25 mg Intravenous Given 01/14/17 1240)   CRITICAL CARE Performed by: Lita Mains, Kambria Grima Total critical care time: 45 minutes Critical care time was exclusive of separately billable procedures and treating other patients. Critical care was necessary to treat or prevent imminent or life-threatening deterioration. Critical care was time spent personally by me on the following activities: development of treatment plan with patient and/or surrogate as well as nursing, discussions with consultants, evaluation of patient's response to treatment, examination of patient, obtaining history from patient or surrogate, ordering and performing treatments and interventions, ordering and review of laboratory studies, ordering and review of radiographic studies, pulse oximetry and re-evaluation of patient's condition.  Initial Impression / Assessment and Plan / ED Course  I have reviewed the triage vital signs and the nursing notes.  Pertinent labs & imaging results that were available during my care of the patient were reviewed by me and considered in my medical decision making (see chart for details).    Patient had a peripheral IV placed in the left wrist by EMS. Removed by nursing staff and patient had pulsatile bleeding from the site. Likely radial artery puncture. Pressure dressing applied. The difficulty finding prophylaxis for this patient. Wary of obtaining central access due to known end-stage liver disease and concern for bleeding. IV team will attempt a PICC line in the left upper extremity.  Rectal tube and lactulose ordered for elevated ammonia levels. Also cover with broad-spectrum antibiotics given lower extremity infection. Discussed with Dr. Justin Mend regarding hemodialysis. Thinks that the  patient is clinically stable she can be dialyzed in the morning. Discussed with hospitalist will see patient in the emergency department and admit. Final Clinical Impressions(s) / ED Diagnoses   Final diagnoses:  Hepatic encephalopathy ([place])  Uremia  Hypervolemia, unspecified hypervolemia type  PICC (peripherally inserted central catheter) in place    New Prescriptions Current Discharge Medication List       Julianne Rice, MD 01/14/17 1347

## 2017-01-12 NOTE — Telephone Encounter (Signed)
Tried to call. VM not set up.

## 2017-01-12 NOTE — H&P (Addendum)
TRH H&P    Patient Demographics:    Wanda [name], is a 41 y.o. female  MRN: [id number]  DOB - [birth year]  Admit Date - 01/12/2017  Referring MD/NP/PA: Dr Lita Mains  Outpatient Primary MD for the patient is [name], MD  Patient coming from: home  Chief Complaint  Patient presents with  . Altered Mental Status      HPI:    Wanda [name]  is a 41 y.o. female, With history of ESRD on hemodialysis, liver cirrhosis due to [facility], history of noncompliance, chronic thrombocytopenia today was brought to ED with altered mental status patient has not had dialysis for past 2 weeks. Home health found patient lying in bed and unresponsive. EMS was called. As per ED notes patient's boyfriend was in easy regarding direct answers he seemed to indicate patient was ambulatory and in normal state of health yesterday.  In ED patient was found to be hypothermic, significantly elevated ammonia 311, elevated BUN/creatinine 120/20.34. Patient continues to be obtunded, responds with moaning to sternal rub. PICC line has been placed. Nephrology Dr. Justin Mend  at [facility] was consulted by ED physician, and he recommended patient to stay at [facility] for hemodialysis in a.m.  No other history obtainable due to patient's altered mental status.   Review of systems:    Unobtainable due to patient's altered mental status   With Past History of the following :    Past Medical History:  Diagnosis Date  . Acute blood loss anemia 02/25/2014   Status post transfusion  . Acute renal failure ([place]) 09/2013   Pre-renal- resolved  . Anasarca 10/10/2013  . Anxiety   . Bipolar disorder ([place]) 12/04/2013   2007-SEEN IN ED FOR INVOLUNTARY COMMITMENT, UDS POS FOR AMPHETAMINES/OPIATES   . Bleeding esophageal varices ([place]) 02/28/2014   s/p banding  . C. difficile colitis 04/19/2014  . Chronic hypotension   . Cirrhosis  ([place]) 10/05/13   Liver bx 11/23/13 (delayed initially due to patient refusal). c/w steatohepatitis  . Cirrhosis of liver with ascites ([place])   . Depression   . ESRD (end stage renal disease) on dialysis ([place]) 08/2014  . Folate deficiency 09/2013  . Gastroesophageal junction ulcer 09/17/2014  . GERD (gastroesophageal reflux disease)   . Hematemesis/vomiting blood 02/24/2014  . Macrocytosis 02/28/2014  . PNA (pneumonia) 10/13/2013  . SBP (spontaneous bacterial peritonitis) ([place]) 11/10/2013  . Thrombocytopenia ([place])    Hypercellular bone marrow; abundant megakaryocytes per 08/27/2014; s/p bone marrow bx      Past Surgical History:  Procedure Laterality Date  . AV FISTULA PLACEMENT Right 11/16/2014   Procedure: Right arm Creation of arteriovenous fistula;  Surgeon: Angelia Mould, MD;  Location: [facility];  Service: Vascular;  Laterality: Right;  . CENTRAL VENOUS CATHETER INSERTION Right   . COLONOSCOPY N/A 12/19/2013   SLF:NO OBVIOUS SOURCE FOR ANEMIA IDETIFIED/ONE COLON POLYP REMOVED/Small internal hemorrhoids  . ESOPHAGEAL BANDING  07/04/2014   Procedure: ESOPHAGEAL BANDING;  Surgeon: Daneil Dolin, MD;  [facility];  Service: Endoscopy;;  .  ESOPHAGEAL BANDING N/A 07/24/2014   Procedure: ESOPHAGEAL BANDING (2 bands applied);  Surgeon: Danie Binder, MD;  Location: [facility];  Service: Endoscopy;  Laterality: N/A;  . ESOPHAGEAL BANDING N/A 07/23/2015   Procedure: ESOPHAGEAL BANDING;  Surgeon: Danie Binder, MD;  [facility];  Service: Endoscopy;  Laterality: N/A;  . ESOPHAGEAL BANDING N/A 03/04/2016   Procedure: ESOPHAGEAL BANDING;  Surgeon: Danie Binder, MD;  [facility];  Service: Endoscopy;  Laterality: N/A;  . ESOPHAGEAL BANDING N/A 04/30/2016   Procedure: ESOPHAGEAL BANDING;  Surgeon: Daneil Dolin, MD;  [facility];  Service: Endoscopy;  Laterality: N/A;  . ESOPHAGOGASTRODUODENOSCOPY N/A 11/14/2013   SLF:1 column of very small varices in distal  esopahgus/MODERATE PORTAL GASTROPATHY IN PROXIMAL STOMACH/MODERATE erosive gastritis  . ESOPHAGOGASTRODUODENOSCOPY N/A 02/11/2014   Dr. Rourk:Esophageal varices with bleeding stigmata-status post esophageal band ligation therapy. Portal gastropathy  . ESOPHAGOGASTRODUODENOSCOPY N/A 07/04/2014   RMR: Persiting grade 2 esophageal varicies with bleeding stigmata status post band ligation. Significantly congested gastric mucosa iwith changes constistant with protal gastropathy.   . ESOPHAGOGASTRODUODENOSCOPY N/A 09/16/2014   Rehman: Single short column of varix proximal to GE junction not large enough to be banded. Two amall ulcers at the GEJ felt to be source of GI Bleeding but no active bleeding but no actibe bleeding noted. No therapy rendered. Portal gastropathy NO evidence of peptic ulcer diease or gastric varices.   . ESOPHAGOGASTRODUODENOSCOPY N/A 12/14/2014   SLF: Grade ! esophageal varices. 2. Moderate Portal Gastropathy  . ESOPHAGOGASTRODUODENOSCOPY N/A 03/27/2015   Procedure: ESOPHAGOGASTRODUODENOSCOPY (EGD);  Surgeon: Danie Binder, MD;  [facility];  Service: Endoscopy;  Laterality: N/A;  1045am - moved to 817 @ 11:30  . ESOPHAGOGASTRODUODENOSCOPY N/A 06/05/2015   Procedure: ESOPHAGOGASTRODUODENOSCOPY (EGD);  Surgeon: Clarene Essex, MD;  Location[facility] ENDOSCOPY;  Service: Endoscopy;  Laterality: N/A;  . ESOPHAGOGASTRODUODENOSCOPY N/A 07/23/2015   SLF:1. Grade 1 esophageal varices 2. Moderate portal hypertensive gastropathy 3. MILd non-erosive gastritis.   [name] ESOPHAGOGASTRODUODENOSCOPY N/A 03/04/2016   Dr. Oneida Alar: grade 1 varices, surveillance in Jan 2017   . ESOPHAGOGASTRODUODENOSCOPY N/A 05/21/2016   Dr. Carlean Purl: non-bleeding esophageal ulcer, likely secondary to MW tear  . ESOPHAGOGASTRODUODENOSCOPY (EGD) WITH PROPOFOL N/A 07/24/2014   SLF:  1. 2 columns grade 2-3 varices- 2 Bands applied.  2.  Moderate gastropathy 3. Duodenal Diverticula  . ESOPHAGOGASTRODUODENOSCOPY (EGD) WITH  PROPOFOL N/A 10/26/2014   RMR: 2 columns of grade 2 esophageal varices without obvious bleeding stigmata status post band ligation to complet obliteration of remaining varices.   . ESOPHAGOGASTRODUODENOSCOPY (EGD) WITH PROPOFOL N/A 04/30/2016   Dr. Gala Romney: MW tear, varices obliterated, portal gastropathy  . None    . PARACENTESIS  Feb 2015   1180 fluid, negative fluid analysis.   [name] PARACENTESIS  10/2013      Social History:      Social History  Substance Use Topics  . Smoking status: Former Smoker    Packs/day: 0.25    Years: 20.00    Types: Cigarettes    Quit date: 11/05/2008  . Smokeless tobacco: Never Used     Comment: Never really smoked much  . Alcohol use No       Family History :     Family History  Problem Relation Age of Onset  . Heart disease Mother   . Colon cancer Neg Hx   . Liver disease Neg Hx       Home Medications:   Prior  to Admission medications   Medication Sig Start Date End Date Taking? Authorizing Provider  clotrimazole (LOTRIMIN) 1 % cream Apply 1 application topically 3 (three) times daily. 12/25/16  Yes Annitta Needs, NP  Darbepoetin Alfa (ARANESP) 60 MCG/0.3ML SOSY injection Inject 0.3 mLs (60 mcg total) into the vein every Thursday with hemodialysis. Patient taking differently: Inject 60 mcg into the vein every Tuesday with hemodialysis.  06/09/15  Yes Cherene Altes, MD  lactulose (CHRONULAC) 10 GM/15ML solution Take 15-30 mLs (10-20 g total) by mouth 3 (three) times daily. TAKE 15-30 ml BY MOUTH three times a day 12/21/16  Yes Samuella Cota, MD  lidocaine (XYLOCAINE) 2 % solution TAKE TWO TEASPOONSFUL (10ML) BY MOUTH BEFORE MEALS AND AT BEDTIME TO PREVENT CHEST PAIN WHILE EATING 12/25/16  Yes Annitta Needs, NP  lidocaine-prilocaine (EMLA) cream Apply 1 application topically as needed (for dialysis treatments).   Yes [provider]  LORazepam (ATIVAN) 1 MG tablet Take 1 mg by mouth 2 (two) times daily as needed for anxiety  (and/or back spasms).   Yes [provider]  midodrine (PROAMATINE) 10 MG tablet Take 1 tablet (10 mg total) by mouth 3 (three) times daily. 12/21/16  Yes Samuella Cota, MD  omeprazole (PRILOSEC) 20 MG capsule 1 po every morning 30 minutes prior to your first meal. Patient taking differently: Take 20 mg by mouth daily before breakfast. 1 po every morning 30 minutes prior to your first meal. 09/30/16  Yes Fields, Marga Melnick, MD  Oxycodone HCl 10 MG TABS Take 0.5 tablets (5 mg total) by mouth 3 (three) times daily as needed. 1 PO TID AS NEEDED FOR PAIN Patient taking differently: Take 5-10 mg by mouth 3 (three) times daily as needed.  09/27/16  Yes Cherene Altes, MD  RENVELA 800 MG tablet Take 1600 mg by mouth 3 times daily with meals. Take 800 mg by mouth with snacks. 10/29/15  Yes [provider]  sucralfate (CARAFATE) 1 GM/10ML suspension Take 1 g by mouth 4 (four) times daily -  with meals and at bedtime.   Yes [provider]  augmented betamethasone dipropionate (DIPROLENE-AF) 0.05 % cream APPLY TO THE AFFECTED AREAS ON LEGS TWICE DAILY AS NEEDED (NOT FACE, GROIN, OR UNDER ARMS) 12/24/16   [provider]  midodrine (PROAMATINE) 5 MG tablet TAKE TWO (2) TABLETS BY MOUTH DAILY. TAKE ONLY ON DIALYSIS DAYS ON TUESDAYS, THURDAYS, AND SATURDAYS (SOMETIME TAKES TREATMENTS ON FRIDAY IN 12/29/16   [provider]     Allergies:     Allergies  Allergen Reactions  . Iohexol Rash    Pt states rash after last CT. Pt needs pre-meds  . Lasix [Furosemide] Other (See Comments)    "doesn't work"  . Latex Itching     Physical Exam:   Vitals  Blood pressure (!) 129/57, pulse (!) 106, temperature (!) 96.8 F (36 C), resp. rate 19, height 5\' 1"  (1.549 m), weight 71.2 kg (157 lb), last menstrual period 09/21/2013, SpO2 97 %.  1.  General: Patient obtunded, responds to sternal rub.   2. Psychiatric:  Intact judgement and  insight, awake alert, oriented x  3.  3. Neurologic: Patient moving all extremities, responds to sternal rub  4. Eyes :  anicteric sclerae  5. ENMT:  Oropharynx clear with moist mucous membranes   6. Neck:  supple, no cervical lymphadenopathy appriciated, No thyromegaly  7. Respiratory : Normal respiratory effort, bilateral rhonchi  8. Cardiovascular : RRR, no  gallops, rubs or murmurs,   9. Gastrointestinal:  Positive bowel sounds, abdomen soft, non-tender to palpation,no hepatosplenomegaly, no rigidity or guarding       10. Skin: Bullae noted in bilateral lower extremities, with one bulla ruptured in  right lower extremity with foul-smelling discharge , surrounding skin is erythematous and warm to touch.       Data Review:    CBC  Recent Labs Lab 01/12/17 1723 01/12/17 1824  WBC  --  6.6  HGB 7.8* 8.1*  HCT 23.0* 24.7*  PLT  --  82*  MCV  --  91.1  MCH  --  29.9  MCHC  --  32.8  RDW  --  17.7*  LYMPHSABS  --  0.7  MONOABS  --  0.3  EOSABS  --  0.1  BASOSABS  --  0.0   ------------------------------------------------------------------------------------------------------------------  Chemistries   Recent Labs Lab 01/12/17 1723 01/12/17 1824  NA 135 140  K 5.8* 5.7*  CL 104 101  CO2  --  14*  GLUCOSE 117* 121*  BUN 133* 120*  CREATININE >18.00* 20.34*  CALCIUM  --  7.9*  AST  --  37  ALT  --  15  ALKPHOS  --  93  BILITOT  --  0.8   ------------------------------------------------------------------------------------------------------------------  ------------------------------------------------------------------------------------------------------------------ GFR: Estimated Creatinine Clearance: 3.3 mL/min (A) (by C-G formula based on SCr of 20.34 mg/dL (H)). Liver Function Tests:  Recent Labs Lab 01/12/17 1824  AST 37  ALT 15  ALKPHOS 93  BILITOT 0.8  PROT 6.3*  ALBUMIN 2.8*    Recent Labs Lab 01/12/17 1824  LIPASE 64*    Recent Labs Lab 01/12/17 1824    AMMONIA 311*   Coagulation Profile:  Recent Labs Lab 01/12/17 1824  INR 1.19   Cardiac Enzymes:  Recent Labs Lab 01/12/17 1824  TROPONINI <0.03    --------------------------------------------------------------------------------------------------------------- Urine analysis:    Component Value Date/Time   COLORURINE YELLOW 01/12/2017 [place] 01/12/2017 1650   LABSPEC 1.013 01/12/2017 1650   PHURINE 6.0 01/12/2017 1650   GLUCOSEU NEGATIVE 01/12/2017 1650   HGBUR SMALL (A) 01/12/2017 1650   BILIRUBINUR NEGATIVE 01/12/2017 1650   KETONESUR NEGATIVE 01/12/2017 1650   PROTEINUR 30 (A) 01/12/2017 1650   UROBILINOGEN 0.2 08/13/2014 2138   NITRITE NEGATIVE 01/12/2017 1650   LEUKOCYTESUR NEGATIVE 01/12/2017 1650      Imaging Results:    Dg Chest Port 1 View  Result Date: 01/12/2017 CLINICAL DATA:  Altered mentation and wheeze EXAM: PORTABLE CHEST 1 VIEW COMPARISON:  12/22/2016 FINDINGS: Borderline cardiomegaly with aortic atherosclerosis. Slight interval increase in pulmonary vascular congestion consistent mild CHF. No pneumonic consolidation, effusion or pneumothorax. Minimal atelectasis and/or scarring at the right lung base. No acute nor suspicious osseous lesions. IMPRESSION: Interval increase in pulmonary vascular congestion consistent with mild CHF. Stable aortic atherosclerosis Electronically Signed   By: Ashley Royalty M.D.   On: 01/12/2017 17:57   Dg Chest Port 1v Same Day  Result Date: 01/12/2017 CLINICAL DATA:  PICC line placement EXAM: PORTABLE CHEST 1 VIEW COMPARISON:  01/12/2017 FINDINGS: Left upper extremity catheter tip directed cephalad. Stable borderline cardiomegaly. Mild central vascular calcification. No focal consolidation or effusion. Aortic atherosclerosis. IMPRESSION: 1. Left upper extremity catheter tip directed cephalad, possibly within the azygos system. Repositioning suggested 2. Cardiomegaly with slight worsens in central vascular  congestion. Electronically Signed   By: Donavan Foil M.D.   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 Altered mental status- likely multifactorial from hepatic encephalopathy, sepsis from underlying cellulitis/wound infection. Patient hypothermic,  on bair hugger, will start vancomycin and Zosyn per pharmacy consultation. Also patient started on lactulose 30 g 4 times a day via rectal tube. Will follow ammonia level in a.m. Follow blood cultures. 2. Hepatic encephalopathy - patient has elevated ammonia 311, started on lactulose as above. Follow ammonia level in a.m. 3. End-stage renal disease- patient missed hemodialysis for past 2 weeks, potassium is 5.7, BUN 120, creatinine 20.34. [facility] at [facility] was consulted. No emergent need to transfer recommended at this time, patient is stable to stay at [facility] hospital for hemodialysis in a.m. we'll consult nephrology in a.m. for hemodialysis. 4. Cellulitis/wound infection-patient has bullous skin disease, presenting with cellulitis and infected bulla of the right lower extremity. Wound culture has been obtained. Patient started on vancomycin and Zosyn as above. Follow wound culture results. 5. Chronic thrombocytopenia- likely from liver cirrhosis due to [facility], follow platelet count in a.m. 6. Liver cirrhosis, secondary to [facility]- liver enzymes are stable, blood pressure stable. Hold oral medications due to altered mental status. Consider restarting these medications in a.m. once patient is more alert.   DVT Prophylaxis-   SCDs  AM Labs Ordered, also please review Full Orders  Family Communication: No family at bedside  Code Status:  Full code  Admission status: Observation    Time spent in minutes : 60 minutes   Foster Sonnier  S M.D on 01/12/2017 at 8:52 PM  Between 7am to 7pm - Pager - [phone]. After 7pm go to [email or web address] - password [facility]  [facility] - Office  [phone]

## 2017-01-12 NOTE — ED Notes (Signed)
T/c to [name] at [phone], he states "as far as I know she has never signed any POA paperwork but her aunt, Oletha Cruel, is over her bank acct and stuff", he further states aunt, Oletha Cruel, works for [employer]	[place] as a Nurse, learning disability and provided the [place]:  Cell: [phone] Home: [phone] Work: [phone]  He also states to his knowledge the pt last her home medications yesterday.   Attempted call to Oletha Cruel, aunt-no answer at home or cell number

## 2017-01-12 NOTE — Telephone Encounter (Signed)
Called again, can't leave VM.

## 2017-01-12 NOTE — Telephone Encounter (Signed)
Tried to call again

## 2017-01-12 NOTE — Telephone Encounter (Signed)
Tried to call again. Could not leave VM.

## 2017-01-12 NOTE — ED Notes (Signed)
Report given to [facility], ICU

## 2017-01-12 NOTE — ED Triage Notes (Signed)
Patient from EMS. EMS sent to patient's house by dialysis center for well fair check. Patient missed dialysis for past two weeks. Patient is unresponsive and responds to painful stimuli.

## 2017-01-12 NOTE — Telephone Encounter (Signed)
PT NEEDS FACE TO FACE VISIT. SCHEDULE JUN 6.

## 2017-01-12 NOTE — ED Notes (Signed)
Attempted to contact [name], significant other, via telephone at [phone] to request information regarding whether pt has a medical POA or next of kin, when dialed there is one ring and then a busy signal x 4

## 2017-01-12 NOTE — ED Notes (Signed)
T/c to son, Electa Sniff at [phone] - spoke with him and advised his mother is in hospital and trying to obtain any information regarding a medical POA and/or next of kin; he states "as far as I know, if you mean like anyone okaying pulling the plug, that would be me but I would like to sign over permission for [name] to make decisions for her since he knows more about her"; Mr. Glynda Jaeger gave a cell phone number of [phone] to reach [name].

## 2017-01-13 ENCOUNTER — Encounter (HOSPITAL_COMMUNITY)
Admission: EM | Disposition: A | Discharge status: Home / Self Care | Payer: Self-pay | Source: Home / Self Care | Attending: Internal Medicine

## 2017-01-13 ENCOUNTER — Inpatient Hospital Stay (HOSPITAL_COMMUNITY): Payer: Medicare Other

## 2017-01-13 DIAGNOSIS — K7581 Nonalcoholic steatohepatitis (NASH): Secondary | ICD-10-CM

## 2017-01-13 DIAGNOSIS — K920 Hematemesis: Secondary | ICD-10-CM

## 2017-01-13 DIAGNOSIS — K7460 Unspecified cirrhosis of liver: Secondary | ICD-10-CM

## 2017-01-13 DIAGNOSIS — K922 Gastrointestinal hemorrhage, unspecified: Secondary | ICD-10-CM

## 2017-01-13 DIAGNOSIS — K210 Gastro-esophageal reflux disease with esophagitis: Secondary | ICD-10-CM

## 2017-01-13 HISTORY — PX: ESOPHAGOGASTRODUODENOSCOPY: SHX5428

## 2017-01-13 HISTORY — PX: ESOPHAGEAL BANDING: SHX5518

## 2017-01-13 LAB — COMPREHENSIVE METABOLIC PANEL
ALBUMIN: 3 g/dL — AB (ref 3.5–5.0)
ALT: 17 U/L (ref 14–54)
AST: 36 U/L (ref 15–41)
Alkaline Phosphatase: 109 U/L (ref 38–126)
Anion gap: 25 — ABNORMAL HIGH (ref 5–15)
BUN: 132 mg/dL — AB (ref 6–20)
CHLORIDE: 101 mmol/L (ref 101–111)
CO2: 14 mmol/L — AB (ref 22–32)
CREATININE: 20.48 mg/dL — AB (ref 0.44–1.00)
Calcium: 8 mg/dL — ABNORMAL LOW (ref 8.9–10.3)
GFR calc Af Amer: 2 mL/min — ABNORMAL LOW (ref >60)
GFR calc non Af Amer: 2 mL/min — ABNORMAL LOW (ref >60)
Glucose, Bld: 99 mg/dL (ref 65–99)
POTASSIUM: 5.8 mmol/L — AB (ref 3.5–5.1)
SODIUM: 140 mmol/L (ref 135–145)
Total Bilirubin: 1 mg/dL (ref 0.3–1.2)
Total Protein: 6.8 g/dL (ref 6.5–8.1)

## 2017-01-13 LAB — BLOOD CULTURE ID PANEL (REFLEXED)
Acinetobacter baumannii: NOT DETECTED
CANDIDA GLABRATA: NOT DETECTED
CANDIDA TROPICALIS: NOT DETECTED
Candida albicans: NOT DETECTED
Candida krusei: NOT DETECTED
Candida parapsilosis: NOT DETECTED
ENTEROBACTER CLOACAE COMPLEX: NOT DETECTED
ENTEROBACTERIACEAE SPECIES: NOT DETECTED
ENTEROCOCCUS SPECIES: NOT DETECTED
Escherichia coli: NOT DETECTED
HAEMOPHILUS INFLUENZAE: NOT DETECTED
KLEBSIELLA PNEUMONIAE: NOT DETECTED
Klebsiella oxytoca: NOT DETECTED
LISTERIA MONOCYTOGENES: NOT DETECTED
METHICILLIN RESISTANCE: DETECTED — AB
NEISSERIA MENINGITIDIS: NOT DETECTED
PROTEUS SPECIES: NOT DETECTED
Pseudomonas aeruginosa: NOT DETECTED
STAPHYLOCOCCUS SPECIES: DETECTED — AB
STREPTOCOCCUS AGALACTIAE: NOT DETECTED
STREPTOCOCCUS SPECIES: NOT DETECTED
Serratia marcescens: NOT DETECTED
Staphylococcus aureus (BCID): NOT DETECTED
Streptococcus pneumoniae: NOT DETECTED
Streptococcus pyogenes: NOT DETECTED

## 2017-01-13 LAB — CBC WITH DIFFERENTIAL/PLATELET
Basophils Absolute: 0 10*3/uL (ref 0.0–0.1)
Basophils Relative: 0 %
Eosinophils Absolute: 0.2 10*3/uL (ref 0.0–0.7)
Eosinophils Relative: 1 %
HCT: 25.4 % — ABNORMAL LOW (ref 36.0–46.0)
Hemoglobin: 8.4 g/dL — ABNORMAL LOW (ref 12.0–15.0)
Lymphocytes Relative: 8 %
Lymphs Abs: 1.2 10*3/uL (ref 0.7–4.0)
MCH: 29.7 pg (ref 26.0–34.0)
MCHC: 33.1 g/dL (ref 30.0–36.0)
MCV: 89.8 fL (ref 78.0–100.0)
Monocytes Absolute: 0.6 10*3/uL (ref 0.1–1.0)
Monocytes Relative: 4 %
Neutro Abs: 12.9 10*3/uL — ABNORMAL HIGH (ref 1.7–7.7)
Neutrophils Relative %: 87 %
Platelets: 236 10*3/uL (ref 150–400)
RBC: 2.83 MIL/uL — ABNORMAL LOW (ref 3.87–5.11)
RDW: 17.8 % — ABNORMAL HIGH (ref 11.5–15.5)
WBC: 14.9 10*3/uL — ABNORMAL HIGH (ref 4.0–10.5)

## 2017-01-13 LAB — BLOOD GAS, ARTERIAL
ACID-BASE DEFICIT: 11.7 mmol/L — AB (ref 0.0–2.0)
Bicarbonate: 15.3 mmol/L — ABNORMAL LOW (ref 20.0–28.0)
DRAWN BY: 270161
FIO2: 100
LHR: 14 {breaths}/min
MECHVT: 500 mL
O2 SAT: 98.8 %
PEEP/CPAP: 5 cmH2O
PH ART: 7.267 — AB (ref 7.350–7.450)
PO2 ART: 295 mmHg — AB (ref 83.0–108.0)
Patient temperature: 35.9
pCO2 arterial: 31.4 mmHg — ABNORMAL LOW (ref 32.0–48.0)

## 2017-01-13 LAB — CBC
HCT: 24.8 % — ABNORMAL LOW (ref 36.0–46.0)
Hemoglobin: 8.3 g/dL — ABNORMAL LOW (ref 12.0–15.0)
MCH: 30.3 pg (ref 26.0–34.0)
MCHC: 33.5 g/dL (ref 30.0–36.0)
MCV: 90.5 fL (ref 78.0–100.0)
PLATELETS: 110 10*3/uL — AB (ref 150–400)
RBC: 2.74 MIL/uL — AB (ref 3.87–5.11)
RDW: 17.6 % — AB (ref 11.5–15.5)
WBC: 10.5 10*3/uL (ref 4.0–10.5)

## 2017-01-13 LAB — GLUCOSE, CAPILLARY
GLUCOSE-CAPILLARY: 125 mg/dL — AB (ref 65–99)
GLUCOSE-CAPILLARY: 98 mg/dL (ref 65–99)
GLUCOSE-CAPILLARY: 108 mg/dL — AB (ref 65–99)
Glucose-Capillary: 138 mg/dL — ABNORMAL HIGH (ref 65–99)

## 2017-01-13 LAB — PROTIME-INR
INR: 1.24
PROTHROMBIN TIME: 15.7 s — AB (ref 11.4–15.2)

## 2017-01-13 LAB — AMMONIA: Ammonia: 205 umol/L — ABNORMAL HIGH (ref 9–35)

## 2017-01-13 LAB — PREPARE RBC (CROSSMATCH)

## 2017-01-13 LAB — MRSA PCR SCREENING: MRSA by PCR: NEGATIVE

## 2017-01-13 SURGERY — EGD (ESOPHAGOGASTRODUODENOSCOPY)
Anesthesia: Moderate Sedation

## 2017-01-13 MED ORDER — VECURONIUM BROMIDE 10 MG IV SOLR
INTRAVENOUS | Status: AC
  Filled 2017-01-13: qty 10

## 2017-01-13 MED ORDER — SODIUM CHLORIDE 0.9% FLUSH: 3.0000 mL | INTRAVENOUS | Status: DC | PRN

## 2017-01-13 MED ORDER — PANTOPRAZOLE SODIUM 40 MG IV SOLR: 40.0000 mg | Freq: Two times a day (BID) | INTRAVENOUS | Status: DC

## 2017-01-13 MED ORDER — OCTREOTIDE ACETATE 500 MCG/ML IJ SOLN
INTRAMUSCULAR | Status: AC
  Filled 2017-01-13: qty 1

## 2017-01-13 MED ORDER — FENTANYL BOLUS VIA INFUSION
50.0000 ug | INTRAVENOUS | Status: DC | PRN
  Administered 2017-01-13: 50 ug via INTRAVENOUS
  Filled 2017-01-13: qty 50

## 2017-01-13 MED ORDER — LIDOCAINE HCL (PF) 1 % IJ SOLN: 5.0000 mL | INTRAMUSCULAR | Status: DC | PRN

## 2017-01-13 MED ORDER — LACTULOSE 10 GM/15ML PO SOLN: 30.0000 g | Freq: Once | ORAL | Status: DC

## 2017-01-13 MED ORDER — SODIUM CHLORIDE 0.9 % IV SOLN
80.0000 mg | Freq: Once | INTRAVENOUS | Status: DC
  Filled 2017-01-13: qty 80

## 2017-01-13 MED ORDER — ETOMIDATE 2 MG/ML IV SOLN
INTRAVENOUS | Status: AC
  Filled 2017-01-13: qty 10

## 2017-01-13 MED ORDER — PENTAFLUOROPROP-TETRAFLUOROETH EX AERO
1.0000 "application " | INHALATION_SPRAY | CUTANEOUS | Status: DC | PRN
  Administered 2017-01-19: 1 via TOPICAL
  Filled 2017-01-13: qty 30

## 2017-01-13 MED ORDER — SODIUM CHLORIDE 0.9 % IV SOLN: Freq: Once | INTRAVENOUS | Status: DC

## 2017-01-13 MED ORDER — PANTOPRAZOLE SODIUM 40 MG IV SOLR
INTRAVENOUS | Status: AC
  Filled 2017-01-13: qty 160

## 2017-01-13 MED ORDER — EPOETIN ALFA 10000 UNIT/ML IJ SOLN
INTRAMUSCULAR | Status: AC
  Administered 2017-01-13: 10000 [IU] via SUBCUTANEOUS
  Filled 2017-01-13: qty 1

## 2017-01-13 MED ORDER — PANTOPRAZOLE SODIUM 40 MG IV SOLR
40.0000 mg | Freq: Two times a day (BID) | INTRAVENOUS | Status: DC
  Administered 2017-01-13 – 2017-01-15 (×6): 40 mg via INTRAVENOUS
  Filled 2017-01-13 (×6): qty 40

## 2017-01-13 MED ORDER — ONDANSETRON HCL 4 MG PO TABS: 4.0000 mg | ORAL_TABLET | Freq: Four times a day (QID) | ORAL | Status: DC | PRN

## 2017-01-13 MED ORDER — FENTANYL 2500MCG IN NS 250ML (10MCG/ML) PREMIX INFUSION
INTRAVENOUS | Status: AC
  Filled 2017-01-13: qty 250

## 2017-01-13 MED ORDER — SODIUM CHLORIDE 0.9% FLUSH: 10.0000 mL | INTRAVENOUS | Status: DC | PRN

## 2017-01-13 MED ORDER — LACTULOSE ENEMA
300.0000 mL | Freq: Four times a day (QID) | ORAL | Status: DC
  Administered 2017-01-13 – 2017-01-14 (×4): 300 mL via RECTAL
  Filled 2017-01-13 (×10): qty 300

## 2017-01-13 MED ORDER — CHLORHEXIDINE GLUCONATE 0.12% ORAL RINSE (MEDLINE KIT)
15.0000 mL | Freq: Two times a day (BID) | OROMUCOSAL | Status: DC
  Administered 2017-01-13 – 2017-01-18 (×8): 15 mL via OROMUCOSAL

## 2017-01-13 MED ORDER — SODIUM CHLORIDE 0.9 % IV SOLN
8.0000 mg/h | INTRAVENOUS | Status: DC
  Filled 2017-01-13: qty 80

## 2017-01-13 MED ORDER — SODIUM CHLORIDE 0.9 % IV SOLN
50.0000 ug/h | INTRAVENOUS | Status: DC
  Administered 2017-01-13: 50 ug/h via INTRAVENOUS
  Filled 2017-01-13: qty 1

## 2017-01-13 MED ORDER — SODIUM CHLORIDE 0.9 % IV SOLN: 100.0000 mL | INTRAVENOUS | Status: DC | PRN

## 2017-01-13 MED ORDER — ORAL CARE MOUTH RINSE
15.0000 mL | Freq: Four times a day (QID) | OROMUCOSAL | Status: DC
  Administered 2017-01-13 – 2017-01-19 (×17): 15 mL via OROMUCOSAL

## 2017-01-13 MED ORDER — SODIUM CHLORIDE 0.9% FLUSH
3.0000 mL | Freq: Two times a day (BID) | INTRAVENOUS | Status: DC
  Administered 2017-01-13 – 2017-01-18 (×10): 3 mL via INTRAVENOUS

## 2017-01-13 MED ORDER — SODIUM CHLORIDE 0.9% FLUSH
10.0000 mL | Freq: Two times a day (BID) | INTRAVENOUS | Status: DC
  Administered 2017-01-13 – 2017-01-18 (×10): 10 mL
  Administered 2017-01-19: 20 mL

## 2017-01-13 MED ORDER — SODIUM CHLORIDE 0.9 % IV SOLN
10.0000 ug/h | INTRAVENOUS | Status: DC
  Administered 2017-01-13: 10 ug/h via INTRAVENOUS
  Filled 2017-01-13: qty 50

## 2017-01-13 MED ORDER — PIPERACILLIN-TAZOBACTAM 3.375 G IVPB
3.3750 g | Freq: Two times a day (BID) | INTRAVENOUS | Status: DC
  Administered 2017-01-13 – 2017-01-15 (×6): 3.375 g via INTRAVENOUS
  Filled 2017-01-13 (×7): qty 50

## 2017-01-13 MED ORDER — VANCOMYCIN HCL IN DEXTROSE 750-5 MG/150ML-% IV SOLN
750.0000 mg | Freq: Once | INTRAVENOUS | Status: AC
  Administered 2017-01-13: 750 mg via INTRAVENOUS
  Filled 2017-01-13: qty 150

## 2017-01-13 MED ORDER — SODIUM CHLORIDE 0.9 % IV SOLN
25.0000 ug/h | INTRAVENOUS | Status: DC
  Administered 2017-01-13: 200 ug/h via INTRAVENOUS
  Administered 2017-01-13: 400 ug/h via INTRAVENOUS
  Administered 2017-01-14: 300 ug/h via INTRAVENOUS
  Administered 2017-01-14: 350 ug/h via INTRAVENOUS
  Administered 2017-01-15 (×2): 400 ug/h via INTRAVENOUS
  Filled 2017-01-13 (×2): qty 50

## 2017-01-13 MED ORDER — HEPARIN SODIUM (PORCINE) 1000 UNIT/ML DIALYSIS
1000.0000 [IU] | INTRAMUSCULAR | Status: DC | PRN
  Filled 2017-01-13: qty 1

## 2017-01-13 MED ORDER — MEPERIDINE HCL 100 MG/ML IJ SOLN
INTRAMUSCULAR | Status: AC
  Filled 2017-01-13: qty 2

## 2017-01-13 MED ORDER — EPOETIN ALFA 10000 UNIT/ML IJ SOLN
10000.0000 [IU] | INTRAMUSCULAR | Status: DC
  Administered 2017-01-13 – 2017-01-19 (×3): 10000 [IU] via SUBCUTANEOUS
  Filled 2017-01-13 (×4): qty 1

## 2017-01-13 MED ORDER — ALTEPLASE 2 MG IJ SOLR
2.0000 mg | Freq: Once | INTRAMUSCULAR | Status: DC | PRN
  Filled 2017-01-13: qty 2

## 2017-01-13 MED ORDER — SODIUM CHLORIDE 0.9 % IV SOLN: 250.0000 mL | INTRAVENOUS | Status: DC | PRN

## 2017-01-13 MED ORDER — ONDANSETRON HCL 4 MG/2ML IJ SOLN: 4.0000 mg | Freq: Four times a day (QID) | INTRAMUSCULAR | Status: DC | PRN

## 2017-01-13 MED ORDER — LIDOCAINE VISCOUS 2 % MT SOLN
OROMUCOSAL | Status: AC
  Filled 2017-01-13: qty 15

## 2017-01-13 MED ORDER — LIDOCAINE-PRILOCAINE 2.5-2.5 % EX CREA
1.0000 "application " | TOPICAL_CREAM | CUTANEOUS | Status: DC | PRN
  Filled 2017-01-13: qty 5

## 2017-01-13 MED ORDER — MIDAZOLAM HCL 5 MG/5ML IJ SOLN
INTRAMUSCULAR | Status: AC
  Filled 2017-01-13: qty 10

## 2017-01-13 MED ORDER — FENTANYL CITRATE (PF) 100 MCG/2ML IJ SOLN
50.0000 ug | Freq: Once | INTRAMUSCULAR | Status: AC
  Administered 2017-01-15: 50 ug via INTRAVENOUS
  Filled 2017-01-13: qty 2

## 2017-01-13 MED ORDER — LACTULOSE 10 GM/15ML PO SOLN
30.0000 g | Freq: Four times a day (QID) | ORAL | Status: DC
  Administered 2017-01-13 (×4): 30 g via ORAL
  Filled 2017-01-13 (×4): qty 60

## 2017-01-13 MED ORDER — CHLORHEXIDINE GLUCONATE CLOTH 2 % EX PADS
6.0000 | MEDICATED_PAD | Freq: Every day | CUTANEOUS | Status: DC
  Administered 2017-01-14 – 2017-01-18 (×5): 6 via TOPICAL

## 2017-01-13 NOTE — Progress Notes (Signed)
Follow up note from earlier admission today: 41 yo with ESRD on HD, NASH , non compliance, thrombocytopenia, admitted with altered mental status and hematemesis, needing dialysis, and admitted into ICU 4.  She is getting octreotide, PPI drip, VAN/Zosyn, and required intubation.  Her Cr is 20, K of 5.8 and Hb of 8.3, platelet of 110K.  Will proceed with EGD, hemodialysis today, and proceed with HD( Dr Theador Hawthorne is aware). She is critically ill and will provide full support.  Will proceed with head CT, suspect it will be negative. Consult Dr Luan Pulling for ventilatory support.  Thank you,  Orvan Falconer MD FACP. Hospitalist.

## 2017-01-13 NOTE — Consult Note (Signed)
Wanda [name] MRN: [id number] DOB/AGE: 43-Aug-1977 43 y.o. Primary Care Physician:Roberson, [name], MD Admit date: 01/12/2017 Chief Complaint:  Chief Complaint  Patient presents with  . Altered Mental Status   HPI: Pt is 41 year old caucasian female with past medical hx of ESRD who was brought to ER with c/o  AMS.  HPI dates back to yesterday as home health nurse  noticed  patient  Was Wanda [name] and called 911. Pt was brought to ER. Upon evaluation in ER  patient was found to be hypothermic, significantly elevated ammonia 311, elevated BUN/creatinine the patient was admitted for further care. Pt later became  tachypneic with labored breathing and   bleeding from the nose.Patient was intubated and  On insertion of  orogastric tube 400 mL of dark red blood was obtained. Pt seen in ICU. Pt is intubated, unable to offer any complaints.     Past Medical History:  Diagnosis Date  . Acute blood loss anemia 02/25/2014   Status post transfusion  . Acute renal failure ([place]) 09/2013   Pre-renal- resolved  . Anasarca 10/10/2013  . Anxiety   . Bipolar disorder ([place]) 12/04/2013   2007-SEEN IN ED FOR INVOLUNTARY COMMITMENT, UDS POS FOR AMPHETAMINES/OPIATES   . Bleeding esophageal varices ([place]) 02/28/2014   s/p banding  . C. difficile colitis 04/19/2014  . Chronic hypotension   . Cirrhosis ([place]) 10/05/13   Liver bx 11/23/13 (delayed initially due to patient refusal). c/w steatohepatitis  . Cirrhosis of liver with ascites ([place])   . Depression   . ESRD (end stage renal disease) on dialysis ([place]) 08/2014  . Folate deficiency 09/2013  . Gastroesophageal junction ulcer 09/17/2014  . GERD (gastroesophageal reflux disease)   . Hematemesis/vomiting blood 02/24/2014  . Macrocytosis 02/28/2014  . PNA (pneumonia) 10/13/2013  . SBP (spontaneous bacterial peritonitis) ([place]) 11/10/2013  . Thrombocytopenia ([place])    Hypercellular bone marrow; abundant megakaryocytes per 08/27/2014; s/p bone marrow bx         Family History  Problem Relation Age of Onset  . Heart disease Mother   . Colon cancer Neg Hx   . Liver disease Neg Hx     Social History:  reports that she quit smoking about 8 years ago. Her smoking use included Cigarettes. She has a 5.00 pack-year smoking history. She has never used smokeless tobacco. She reports that she does not drink alcohol or use drugs.   Allergies:  Allergies  Allergen Reactions  . Iohexol Rash    Pt states rash after last CT. Pt needs pre-meds  . Lasix [Furosemide] Other (See Comments)    "doesn't work"  . Latex Itching    Medications Prior to Admission  Medication Sig Dispense Refill  . clotrimazole (LOTRIMIN) 1 % cream Apply 1 application topically 3 (three) times daily. 12 g 1  . Darbepoetin Alfa (ARANESP) 60 MCG/0.3ML SOSY injection Inject 0.3 mLs (60 mcg total) into the vein every Thursday with hemodialysis. (Patient taking differently: Inject 60 mcg into the vein every Tuesday with hemodialysis. ) 4.2 mL   . lactulose (CHRONULAC) 10 GM/15ML solution Take 15-30 mLs (10-20 g total) by mouth 3 (three) times daily. TAKE 15-30 ml BY MOUTH three times a day    . lidocaine (XYLOCAINE) 2 % solution TAKE TWO TEASPOONSFUL (10ML) BY MOUTH BEFORE MEALS AND AT BEDTIME TO PREVENT CHEST PAIN WHILE EATING 100 mL 1  . lidocaine-prilocaine (EMLA) cream Apply 1 application topically as needed (for dialysis treatments).    . LORazepam (ATIVAN) 1 MG  tablet Take 1 mg by mouth 2 (two) times daily as needed for anxiety (and/or back spasms).    . midodrine (PROAMATINE) 10 MG tablet Take 1 tablet (10 mg total) by mouth 3 (three) times daily.    [name] omeprazole (PRILOSEC) 20 MG capsule 1 po every morning 30 minutes prior to your first meal. (Patient taking differently: Take 20 mg by mouth daily before breakfast. 1 po every morning 30 minutes prior to your first meal.) 31 capsule 11  . Oxycodone HCl 10 MG TABS Take 0.5 tablets (5 mg total) by mouth 3 (three) times daily  as needed. 1 PO TID AS NEEDED FOR PAIN (Patient taking differently: Take 5-10 mg by mouth 3 (three) times daily as needed. ) 1 tablet 0  . RENVELA 800 MG tablet Take 1600 mg by mouth 3 times daily with meals. Take 800 mg by mouth with snacks.  5  . sucralfate (CARAFATE) 1 GM/10ML suspension Take 1 g by mouth 4 (four) times daily -  with meals and at bedtime.    [name] augmented betamethasone dipropionate (DIPROLENE-AF) 0.05 % cream APPLY TO THE AFFECTED AREAS ON LEGS TWICE DAILY AS NEEDED (NOT FACE, GROIN, OR UNDER ARMS)  3  . midodrine (PROAMATINE) 5 MG tablet TAKE TWO (2) TABLETS BY MOUTH DAILY. TAKE ONLY ON DIALYSIS DAYS ON TUESDAYS, THURDAYS, AND SATURDAYS (SOMETIME TAKES TREATMENTS ON FRIDAY IN  3       WER:[id number] to review  . etomidate      . lactulose  30 g Oral QID  . lactulose  30 g Per Tube Once  . lactulose  300 mL Rectal Q6H  . lidocaine      . meperidine      . midazolam      . pantoprazole (PROTONIX) IV  40 mg Intravenous BID AC  . sodium chloride flush  3 mL Intravenous Q12H  . vecuronium         Physical Exam: Vital signs in last 24 hours: Temp:  [95.5 F (35.3 C)-98.6 F (37 C)] 96.6 F (35.9 C) (06/06 0700) Pulse Rate:  [101-131] 131 (06/06 0700) Resp:  [14-33] 14 (06/06 0700) BP: (127-204)/(54-105) 193/61 (06/06 0700) SpO2:  [90 %-99 %] 99 % (06/06 0700) FiO2 (%):  [40 %-100 %] 40 % (06/06 0854) Weight:  [157 lb (71.2 kg)-159 lb 13.3 oz (72.5 kg)] 159 lb 13.3 oz (72.5 kg) (06/06 0200) Weight change:  Last BM Date: 01/13/17  Intake/Output from previous day: No intake/output data recorded. Total I/O In: 100 [I.V.:100] Out: -    Physical Exam: General- pt is intubated HEENT- Epistaxis present, OG tube in situ, Blood in tube Resp- ET tube in situ, decreased at bases CVS- S1S2 regular in rate and rhythm GIT- BS+, soft, umbilical hernia present, distended EXT- 1+ LE Edema,NO Cyanosis. Chronic venous stasis changes present. Access-  AVF+   Lab  Results: CBC  Recent Labs  01/12/17 1824 01/13/17 0447  WBC 6.6 10.5  HGB 8.1* 8.3*  HCT 24.7* 24.8*  PLT 82* 110*    BMET  Recent Labs  01/12/17 1824 01/13/17 0447  NA 140 140  K 5.7* 5.8*  CL 101 101  CO2 14* 14*  GLUCOSE 121* 99  BUN 120* 132*  CREATININE 20.34* 20.48*  CALCIUM 7.9* 8.0*    MICRO Recent Results (from the past 240 hour(s))  Wound or Superficial Culture     Status: None (Preliminary result)   Collection Time: 01/12/17  5:50 PM  Result Value Ref  Range Status   Specimen Description LEG RIGHT  Final   Special Requests Immunocompromised  Final   Gram Stain   Final    MODERATE WBC PRESENT, PREDOMINANTLY PMN ABUNDANT GRAM NEGATIVE RODS Performed at [facility], [place] [address] [place], [place] [address]    Culture PENDING  Incomplete   Report Status PENDING  Incomplete  Culture, blood (Routine X 2) w Reflex to ID Panel     Status: None (Preliminary result)   Collection Time: 01/12/17  6:24 PM  Result Value Ref Range Status   Specimen Description BLOOD LEFT HAND  Final   Special Requests Blood Culture adequate volume  Final   Culture NO GROWTH < 12 HOURS  Final   Report Status PENDING  Incomplete  Culture, blood (Routine X 2) w Reflex to ID Panel     Status: None (Preliminary result)   Collection Time: 01/13/17  4:47 AM  Result Value Ref Range Status   Specimen Description BLOOD PICC LINE  Final   Special Requests   Final    BOTTLES DRAWN AEROBIC AND ANAEROBIC Blood Culture results may not be optimal due to an excessive volume of blood received in culture bottles AEROBIC BOTTLE Blood Culture results may not be optimal due to an inadequate volume of blood received in culture  bottles ANAEROBIC BOTTLE    Culture NO GROWTH < 12 HOURS  Final   Report Status PENDING  Incomplete      Lab Results  Component Value Date   CALCIUM 8.0 (L) 01/13/2017   CAION 0.86 (LL) 01/12/2017   PHOS 4.5 12/21/2016       Impression: 1)RenalESRD  HD started on  08/18/14  Pt is on Tue/Thurs/Saturday schedule   Pt has have hx of non adherence to dialysis               2)CVS- Hemodynamically fragile  BP low  On Midodrine   3)Anemia IN ESRD the goal for HGb is  9--11 Pt hgb is not at  the goal  Will keep on  epo    4)Liver- Cirrhosis Sec to Steato hepatitis  Primary team following  5)Electrolytes  Normokalemia  Normonatremia    6)ID-admitted with po bullus skin disease On IV AbX.   7)Acid base Co2   at goal  8) Thrombocytopenia-Stable Primary MD following    Plan:  Will dialzye today Will use 2k bath Will keep on epo May need albumin for hypotension during hd.       Woodruff S 01/13/2017, 9:21 AM

## 2017-01-13 NOTE — ED Notes (Signed)
Per Leigh with [facility], reviewed image and PICC line pulled back 2cm, stated if less than 3cm, no new image needed to re-verify placement

## 2017-01-13 NOTE — Progress Notes (Signed)
In to see patient at this time, patient noted to be more tachypenic with an elevated HR and  respirations in the 30's-40's.  The patient has perfuse bleeding noted from the nose and mouth. Patient 02 sats currently 94 percent, patient placed on non rebreather, and MD [place] notified at this time. After discussion with MD [place], patient to be intubated. RSI kit pulled, RT notified, and ER physician to come up at this time. MD [place] coming to bedside.      0630   Patient intubated at this time with no noted complications. Via the RSI kit the patient was given 20 Etomidate IVP and 100 Rocuronium  IVP.  Emergent EGD to be performed, son Rachel Bo gave telephone consent for blood products and EGD.   Will continue to monitor the patient closely.

## 2017-01-13 NOTE — ED Provider Notes (Signed)
Called to ICU by Dr. Darrick Meigs. Patient with end-stage renal disease as well as end-stage liver disease. She was admitted with hepatic encephalopathy. sHe has increased tachypnea and respiratory distress. She is obtunded. Dr. Darrick Meigs requests intubation.  Patient is obtunded and agonal breathing on arrival. Fixed gaze. Bleeding from L nare. Tachycardic and tachypnea. Coarse rhonchi bilaterally. Distended abdomen.  Intubation performed.  Patient with large amount of blood in posterior pharynx. OG-tube with 400 mL of frank blood.  Dr. Darrick Meigs aware and at bedside and will start PPI and octreotide and call GI. HR elevated. BP remained stable.  INTUBATION Performed by: Ezequiel Essex  Required items: required blood products, implants, devices, and special equipment available Patient identity confirmed: provided demographic data and hospital-assigned identification number Time out: Immediately prior to procedure a "time out" was called to verify the correct patient, procedure, equipment, support staff and site/side marked as required.  Indications: respiratory failure  Intubation method: Glidescope Laryngoscopy   Preoxygenation: BVM  Sedatives: 20 Etomidate Paralytic: 100 Rocuronium  Tube Size: 7.5 cuffed  Post-procedure assessment: chest rise and ETCO2 monitor Breath sounds: equal and absent over the epigastrium Tube secured with: ETT holder Chest x-ray interpreted by radiologist and me.  Chest x-ray findings: endotracheal tube in appropriate position  Patient tolerated the procedure well with no immediate complications.  CRITICAL CARE Performed by: Ezequiel Essex Total critical care time: 35 minutes Critical care time was exclusive of separately billable procedures and treating other patients. Critical care was necessary to treat or prevent imminent or life-threatening deterioration. Critical care was time spent personally by me on the following activities: development of treatment  plan with patient and/or surrogate as well as nursing, discussions with consultants, evaluation of patient's response to treatment, examination of patient, obtaining history from patient or surrogate, ordering and performing treatments and interventions, ordering and review of laboratory studies, ordering and review of radiographic studies, pulse oximetry and re-evaluation of patient's condition.       Ezequiel Essex, MD 01/13/17 [phone]

## 2017-01-13 NOTE — Telephone Encounter (Signed)
I called pt this morning and [name] answered and said that pt is at [facility] in ICU. He said she has not been to dialysis for the last 2 weeks.  He is aware I was calling for an appt . He is very concerned about the pt and said she is not doing well at all. Sending FYI to Dr. Oneida Alar.

## 2017-01-13 NOTE — Progress Notes (Signed)
Pharmacy Antibiotic Note  Wanda [name] is a 41 y.o. female admitted on 01/12/2017 with cellulitis.  Pharmacy has been consulted for Vancomycin and zosyn dosing. HD patient but non-compliant. Plan dialysis today  Plan: Zosyn 3.375 IV q12h EID Vancomycin 1gm IV given this AM at 0033, will give another 750mg  post HD today F/U additional vancomycin doses based on HD F/U cxs and clinical progress Monitor V/S, labs and levels as indicated  Height: 5\' 4"  (162.6 cm) Weight: 159 lb 13.3 oz (72.5 kg) IBW/kg (Calculated) : 54.7  Temp (24hrs), Avg:96.8 F (36 C), Min:95.5 F (35.3 C), Max:98.6 F (37 C)   Recent Labs Lab 01/12/17 1723 01/12/17 1725 01/12/17 1824 01/12/17 2126 01/13/17 0447  WBC  --   --  6.6  --  10.5  CREATININE >18.00*  --  20.34*  --  20.48*  LATICACIDVEN  --  2.40*  --  1.9  --     Estimated Creatinine Clearance: 3.5 mL/min (A) (by C-G formula based on SCr of 20.48 mg/dL (H)).    Allergies  Allergen Reactions  . Iohexol Rash    Pt states rash after last CT. Pt needs pre-meds  . Lasix [Furosemide] Other (See Comments)    "doesn't work"  . Latex Itching    Antimicrobials this admission: Zosyn 6/6>>  Vancomycin 6/6 >>   Dose adjustments this admission: N/A  Microbiology results: 6/5 BCx: ngtd 6/5 Wound Cx: GNR  6/6 MRSA PCR: neg  Thank you for allowing pharmacy to be a part of this patient's care.  Isac Sarna, BS Pharm D, [place] Clinical Pharmacist Pager [phone] 01/13/2017 10:31 AM

## 2017-01-13 NOTE — Telephone Encounter (Signed)
SEE IP CONSULT.

## 2017-01-13 NOTE — Consult Note (Addendum)
Referring Provider: No ref. provider found Primary Care Physician:  [name], MD Primary Gastroenterologist:  Barney Drain  Reason for Consultation:  HEMATEMESIS   Impression: ADMITTED WITH EPISTAXIS NOW INTUBATED.  Plan: 1. EGD AT BEDSIDE STAT.EMERGENT STAT. 2. COMPLETE CT HEAD 3. OCTREOTIDE STAT. PROTONIX BID. 4. REGLAN 20 MG IV x1NOW.    HPI:  PT FOUND DOWN BY EMS BLEEDING FROM NOSE. INTUBATED IN ICU. OG SHOWS BLOOD IN STOMACH. APOS/O NEG BLOOD IN BLOOD BANK. LIMITED DUE TO PT COGNITIVE DYSFUNCTION   Past Medical History:  Diagnosis Date  . Acute blood loss anemia 02/25/2014   Status post transfusion  . Acute renal failure ([place]) 09/2013   Pre-renal- resolved  . Anasarca 10/10/2013  . Anxiety   . Bipolar disorder ([place]) 12/04/2013   2007-SEEN IN ED FOR INVOLUNTARY COMMITMENT, UDS POS FOR AMPHETAMINES/OPIATES   . Bleeding esophageal varices ([place]) 02/28/2014   s/p banding  . C. difficile colitis 04/19/2014  . Chronic hypotension   . Cirrhosis ([place]) 10/05/13   Liver bx 11/23/13 (delayed initially due to patient refusal). c/w steatohepatitis  . Cirrhosis of liver with ascites ([place])   . Depression   . ESRD (end stage renal disease) on dialysis ([place]) 08/2014  . Folate deficiency 09/2013  . Gastroesophageal junction ulcer 09/17/2014  . GERD (gastroesophageal reflux disease)   . Hematemesis/vomiting blood 02/24/2014  . Macrocytosis 02/28/2014  . PNA (pneumonia) 10/13/2013  . SBP (spontaneous bacterial peritonitis) ([place]) 11/10/2013  . Thrombocytopenia ([place])    Hypercellular bone marrow; abundant megakaryocytes per 08/27/2014; s/p bone marrow bx    Past Surgical History:  Procedure Laterality Date  . AV FISTULA PLACEMENT Right 11/16/2014   Procedure: Right arm Creation of arteriovenous fistula;  Surgeon: Angelia Mould, MD;  Location: [place];  Service: Vascular;  Laterality: Right;  . CENTRAL VENOUS CATHETER INSERTION Right   . COLONOSCOPY N/A 12/19/2013   SLF:NO OBVIOUS  SOURCE FOR ANEMIA IDETIFIED/ONE COLON POLYP REMOVED/Small internal hemorrhoids  . ESOPHAGEAL BANDING  07/04/2014   Procedure: ESOPHAGEAL BANDING;  Surgeon: Daneil Dolin, MD;  [facility];  Service: Endoscopy;;  . ESOPHAGEAL BANDING N/A 07/24/2014   Procedure: ESOPHAGEAL BANDING (2 bands applied);  Surgeon: Danie Binder, MD;  Location: [facility];  Service: Endoscopy;  Laterality: N/A;  . ESOPHAGEAL BANDING N/A 07/23/2015   Procedure: ESOPHAGEAL BANDING;  Surgeon: Danie Binder, MD;  [facility];  Service: Endoscopy;  Laterality: N/A;  . ESOPHAGEAL BANDING N/A 03/04/2016   Procedure: ESOPHAGEAL BANDING;  Surgeon: Danie Binder, MD;  [facility];  Service: Endoscopy;  Laterality: N/A;  . ESOPHAGEAL BANDING N/A 04/30/2016   Procedure: ESOPHAGEAL BANDING;  Surgeon: Daneil Dolin, MD;  [facility];  Service: Endoscopy;  Laterality: N/A;  . ESOPHAGOGASTRODUODENOSCOPY N/A 11/14/2013   SLF:1 column of very small varices in distal esopahgus/MODERATE PORTAL GASTROPATHY IN PROXIMAL STOMACH/MODERATE erosive gastritis  . ESOPHAGOGASTRODUODENOSCOPY N/A 02/11/2014   Dr. Rourk:Esophageal varices with bleeding stigmata-status post esophageal band ligation therapy. Portal gastropathy  . ESOPHAGOGASTRODUODENOSCOPY N/A 07/04/2014   RMR: Persiting grade 2 esophageal varicies with bleeding stigmata status post band ligation. Significantly congested gastric mucosa iwith changes constistant with protal gastropathy.   . ESOPHAGOGASTRODUODENOSCOPY N/A 09/16/2014   Rehman: Single short column of varix proximal to GE junction not large enough to be banded. Two amall ulcers at the GEJ felt to be source of GI Bleeding but no active bleeding but no actibe bleeding noted. No therapy rendered. Portal gastropathy  NO evidence of peptic ulcer diease or gastric varices.   . ESOPHAGOGASTRODUODENOSCOPY N/A 12/14/2014   SLF: Grade ! esophageal varices. 2. Moderate Portal Gastropathy  .  ESOPHAGOGASTRODUODENOSCOPY N/A 03/27/2015   Procedure: ESOPHAGOGASTRODUODENOSCOPY (EGD);  Surgeon: Danie Binder, MD;  [facility];  Service: Endoscopy;  Laterality: N/A;  1045am - moved to 817 @ 11:30  . ESOPHAGOGASTRODUODENOSCOPY N/A 06/05/2015   Procedure: ESOPHAGOGASTRODUODENOSCOPY (EGD);  Surgeon: Clarene Essex, MD;  Location[facility];  Service: Endoscopy;  Laterality: N/A;  . ESOPHAGOGASTRODUODENOSCOPY N/A 07/23/2015   SLF:1. Grade 1 esophageal varices 2. Moderate portal hypertensive gastropathy 3. MILd non-erosive gastritis.   [name] ESOPHAGOGASTRODUODENOSCOPY N/A 03/04/2016   Dr. Oneida Alar: grade 1 varices, surveillance in Jan 2017   . ESOPHAGOGASTRODUODENOSCOPY N/A 05/21/2016   Dr. Carlean Purl: non-bleeding esophageal ulcer, likely secondary to MW tear  . ESOPHAGOGASTRODUODENOSCOPY (EGD) WITH PROPOFOL N/A 07/24/2014   SLF:  1. 2 columns grade 2-3 varices- 2 Bands applied.  2.  Moderate gastropathy 3. Duodenal Diverticula  . ESOPHAGOGASTRODUODENOSCOPY (EGD) WITH PROPOFOL N/A 10/26/2014   RMR: 2 columns of grade 2 esophageal varices without obvious bleeding stigmata status post band ligation to complet obliteration of remaining varices.   . ESOPHAGOGASTRODUODENOSCOPY (EGD) WITH PROPOFOL N/A 04/30/2016   Dr. Gala Romney: MW tear, varices obliterated, portal gastropathy  . None    . PARACENTESIS  Feb 2015   1180 fluid, negative fluid analysis.   [name] PARACENTESIS  10/2013    Prior to Admission medications   Medication Sig Start Date End Date Taking? Authorizing Provider  clotrimazole (LOTRIMIN) 1 % cream Apply 1 application topically 3 (three) times daily. 12/25/16  Yes Annitta Needs, NP  Darbepoetin Alfa (ARANESP) 60 MCG/0.3ML SOSY injection Inject 0.3 mLs (60 mcg total) into the vein every Thursday with hemodialysis. Patient taking differently: Inject 60 mcg into the vein every Tuesday with hemodialysis.  06/09/15  Yes Cherene Altes, MD  lactulose (CHRONULAC) 10 GM/15ML solution Take 15-30  mLs (10-20 g total) by mouth 3 (three) times daily. TAKE 15-30 ml BY MOUTH three times a day 12/21/16  Yes Samuella Cota, MD  lidocaine (XYLOCAINE) 2 % solution TAKE TWO TEASPOONSFUL (10ML) BY MOUTH BEFORE MEALS AND AT BEDTIME TO PREVENT CHEST PAIN WHILE EATING 12/25/16  Yes Annitta Needs, NP  lidocaine-prilocaine (EMLA) cream Apply 1 application topically as needed (for dialysis treatments).   Yes [provider]  LORazepam (ATIVAN) 1 MG tablet Take 1 mg by mouth 2 (two) times daily as needed for anxiety (and/or back spasms).   Yes [provider]  midodrine (PROAMATINE) 10 MG tablet Take 1 tablet (10 mg total) by mouth 3 (three) times daily. 12/21/16  Yes Samuella Cota, MD  omeprazole (PRILOSEC) 20 MG capsule 1 po every morning 30 minutes prior to your first meal. Patient taking differently: Take 20 mg by mouth daily before breakfast. 1 po every morning 30 minutes prior to your first meal. 09/30/16  Yes Lexus Shampine, Marga Melnick, MD  Oxycodone HCl 10 MG TABS Take 0.5 tablets (5 mg total) by mouth 3 (three) times daily as needed. 1 PO TID AS NEEDED FOR PAIN Patient taking differently: Take 5-10 mg by mouth 3 (three) times daily as needed.  09/27/16  Yes Cherene Altes, MD  RENVELA 800 MG tablet Take 1600 mg by mouth 3 times daily with meals. Take 800 mg by mouth with snacks. 10/29/15  Yes [provider]  sucralfate (CARAFATE) 1 GM/10ML suspension Take 1 g by mouth 4 (  four) times daily -  with meals and at bedtime.   Yes [provider]  augmented betamethasone dipropionate (DIPROLENE-AF) 0.05 % cream APPLY TO THE AFFECTED AREAS ON LEGS TWICE DAILY AS NEEDED (NOT FACE, GROIN, OR UNDER ARMS) 12/24/16   [provider]  midodrine (PROAMATINE) 5 MG tablet TAKE TWO (2) TABLETS BY MOUTH DAILY. TAKE ONLY ON DIALYSIS DAYS ON TUESDAYS, THURDAYS, AND SATURDAYS (SOMETIME TAKES TREATMENTS ON FRIDAY IN 12/29/16   [provider]    Current Facility-Administered  Medications  Medication Dose Route Frequency Provider Last Rate Last Dose  . [MAR Hold] 0.9 %  sodium chloride infusion  250 mL Intravenous PRN Oswald Hillock, MD      . etomidate (AMIDATE) 2 MG/ML injection           . fentaNYL (SUBLIMAZE) 2,500 mcg in sodium chloride 0.9 % 250 mL (10 mcg/mL) infusion  10 mcg/hr Intravenous Continuous Darrick Meigs, Marge Duncans, MD      . Doug Sou Hold] lactulose (CHRONULAC) 10 GM/15ML solution 30 g  30 g Oral QID Oswald Hillock, MD      . Doug Sou Hold] lactulose (CHRONULAC) 10 GM/15ML solution 30 g  30 g Per Tube Once [place], Marge Duncans, MD      . lidocaine (XYLOCAINE) 2 % viscous mouth solution           . meperidine (DEMEROL) 100 MG/ML injection           . midazolam (VERSED) 5 MG/5ML injection           . octreotide (SANDOSTATIN) 500 mcg in sodium chloride 0.9 % 250 mL (2 mcg/mL) infusion  50 mcg/hr Intravenous Continuous Darrick Meigs, Marge Duncans, MD      . Doug Sou Hold] ondansetron [facility]) tablet 4 mg  4 mg Oral Q6H PRN Oswald Hillock, MD       Or  . Doug Sou Hold] ondansetron (ZOFRAN) injection 4 mg  4 mg Intravenous Q6H PRN Oswald Hillock, MD      . Doug Sou Hold] pantoprazole (PROTONIX) 80 mg in sodium chloride 0.9 % 100 mL IVPB  80 mg Intravenous Once [place], Gagan S, MD      . pantoprazole (PROTONIX) 80 mg in sodium chloride 0.9 % 250 mL (0.32 mg/mL) infusion  8 mg/hr Intravenous Continuous Darrick Meigs, Marge Duncans, MD      . Doug Sou Hold] pantoprazole (PROTONIX) injection 40 mg  40 mg Intravenous Q12H Darrick Meigs, Marge Duncans, MD      . Doug Sou Hold] sodium chloride flush (NS) 0.9 % injection 3 mL  3 mL Intravenous Q12H Darrick Meigs, Marge Duncans, MD      . Doug Sou Hold] sodium chloride flush (NS) 0.9 % injection 3 mL  3 mL Intravenous PRN Oswald Hillock, MD      . vecuronium (NORCURON) 10 MG injection             Allergies as of 01/12/2017 - Review Complete 01/12/2017  Allergen Reaction Noted  . Iohexol Rash 11/23/2016  . Lasix [furosemide] Other (See Comments) 04/12/2014  . Latex Itching 05/14/2015    Family History  Problem Relation  Age of Onset  . Heart disease Mother   . Colon cancer Neg Hx   . Liver disease Neg Hx      Social History   Social History  . Marital status: Single    Spouse name: N/A  . Number of children: 1  . Years of education: N/A   Occupational History  . unemployed  Social History Main Topics  . Smoking status: Former Smoker    Packs/day: 0.25    Years: 20.00    Types: Cigarettes    Quit date: 11/05/2008  . Smokeless tobacco: Never Used     Comment: Never really smoked much  . Alcohol use No  . Drug use: No  . Sexual activity: Not Currently    Partners: Male    Birth control/ protection: None   Other Topics Concern  . Not on file   Social History Narrative  . No narrative on file    Review of Systems: PER HPI OTHERWISE ALL SYSTEMS ARE NEGATIVE.   Vitals: Blood pressure (!) 155/65, pulse (!) 129, temperature 98.6 F (37 C), temperature source Core (Comment), resp. rate (!) 33, height 5\' 4"  (1.626 m), weight 159 lb 13.3 oz (72.5 kg), last menstrual period 09/21/2013, SpO2 99 %.  Physical Exam: General:   OBTUNDED in NAD Head:  Normocephalic and BLOOD COMING OUT OF HER NOSE/MOUTH Mouth:  No lesions, dentition ABnormal. Neck:  Supple; no masses. Lungs:  Clear throughout to auscultation.   No wheezes. No acute distress. Heart:  Regular rate and rhythm; no murmurs Abdomen:  Soft, nontender and distended. No masses, hepatosplenomegaly or hernias noted. HYPOACTIVE bowel sounds, without guarding, and without rebound.   Msk:  Symmetrical with gross deformities.  Extremities:  Without edema. Neurologic: OBTUNDED AND UNRESPONSIVE, PUPILS FIXED, DILATED, UNRESPONSIVE Cervical Nodes:  No significant cervical adenopathy. Psych:  UNRESPONSIVE   Lab Results:  Recent Labs  01/12/17 1723 01/12/17 1824 01/13/17 0447  WBC  --  6.6 10.5  HGB 7.8* 8.1* 8.3*  HCT 23.0* 24.7* 24.8*  PLT  --  82* 110*   BMET  Recent Labs  01/12/17 1824 01/13/17 0447  NA 140 140  K 5.7*  5.8*  CL 101 101  CO2 14* 14*  GLUCOSE 121* 99  BUN 120* 132*  CREATININE 20.34* 20.48*  CALCIUM 7.9* 8.0*   LFT  Recent Labs  01/13/17 0447  PROT 6.8  ALBUMIN 3.0*  AST 36  ALT 17  ALKPHOS 109  BILITOT 1.0     Studies/Results: NO ACUTE DISEASE   LOS: 1 day   Corona Popovich  01/13/2017, 7:36 AM

## 2017-01-13 NOTE — Progress Notes (Addendum)
Called by RN to evaluate patient with labored breathing, nosebleed.  Vitals:   01/13/17 0400 01/13/17 0500  BP: (!) 154/68 (!) 155/65  Pulse: (!) 117 (!) 129  Resp: (!) 27 (!) 33  Temp: 98.2 F (36.8 C) 98.6 F (37 C)   Patient was found to be tachypneic with labored breathing, bleeding from the nose. Bilateral rhonchi on auscultation of lungs. Called ER physician, Dr Wyvonnia Dusky and patient was intubated. After orogastric tube was inserted, 400 mL of dark red blood was obtained. I called and discussed with GI on call Dr. Trinda Pascal, who recommended to start octreotide infusion, Protonix infusion. She would come to see the patient for upper GI endoscopy.  Plan  Octreotide infusion Protonix infusion Stat PT INR ABG Check CT head Fentanyl infusion for sedation Called and discussed with nephrologist Dr. Theador Hawthorne, regarding hemodialysis.   critical care  time spent 45 minutes

## 2017-01-13 NOTE — Progress Notes (Signed)
Patient passing multiple large clots through her nose and out of her mouth this morning. She was taken to CT by myself and respiratory and after CT head began expelling several large clots from her mouth. After this episode she did not have any more large clots. Patient has had about 200cc of red/coffee ground drainage from her OG tube.

## 2017-01-13 NOTE — Progress Notes (Signed)
While I was giving patient rectal lactulose I noticed that patient had pushed her rectal tube out. We cleaned patient up and attempted to replace rectal tube but was unsuccessful. Patient became very stiff, tachypenic, and resisted flexiseal. Spoke to Dr. Marin Comment and got the order changed so Fentanyl could be titrated for RASS goal. Patient tolerating Fentanyl increase at this time.

## 2017-01-13 NOTE — Plan of Care (Signed)
Problem: Respiratory: Goal: Ability to maintain a clear airway and adequate ventilation will improve Outcome: Progressing VAP bundle in place.

## 2017-01-13 NOTE — Progress Notes (Signed)
ANTIBIOTIC CONSULT NOTE-Preliminary  Pharmacy Consult for vancomycin, zosyn Indication: cellulitis, wound infection  Allergies  Allergen Reactions  . Iohexol Rash    Pt states rash after last CT. Pt needs pre-meds  . Lasix [Furosemide] Other (See Comments)    "doesn't work"  . Latex Itching    Patient Measurements: Height: 5\' 1"  (154.9 cm) Weight: 157 lb (71.2 kg) IBW/kg (Calculated) : 47.8 Adjusted Body Weight: 55kg  Vital Signs: Temp: 96.4 F (35.8 C) (06/05 2315) Temp Source: Other (Comment) (06/05 2315) BP: 161/81 (06/05 2315) Pulse Rate: 117 (06/05 2315)  Labs:  Recent Labs  01/12/17 1723 01/12/17 1824  WBC  --  6.6  HGB 7.8* 8.1*  PLT  --  82*  CREATININE >18.00* 20.34*    Estimated Creatinine Clearance: 3.3 mL/min (A) (by C-G formula based on SCr of 20.34 mg/dL (H)).  No results for input(s): VANCOTROUGH, VANCOPEAK, VANCORANDOM, GENTTROUGH, GENTPEAK, GENTRANDOM, TOBRATROUGH, TOBRAPEAK, TOBRARND, AMIKACINPEAK, AMIKACINTROU, AMIKACIN in the last 72 hours.   Microbiology: Recent Results (from the past 720 hour(s))  Wound or Superficial Culture     Status: None   Collection Time: 12/22/16  5:35 PM  Result Value Ref Range Status   Specimen Description WOUND  Final   Special Requests Immunocompromised  Final   Gram Stain NO WBC SEEN NO ORGANISMS SEEN   Final   Culture   Final    NORMAL SKIN FLORA Performed at [facility], [address], [place], [place] [address]    Report Status 12/25/2016 FINAL  Final  Culture, blood (routine x 2)     Status: None   Collection Time: 12/22/16  9:00 PM  Result Value Ref Range Status   Specimen Description BLOOD RIGHT FOREARM  Final   Special Requests   Final    BOTTLES DRAWN AEROBIC AND ANAEROBIC Blood Culture results may not be optimal due to an inadequate volume of blood received in culture bottles   Culture NO GROWTH 5 DAYS  Final   Report Status 12/27/2016 FINAL  Final  Culture, blood (routine x 2)      Status: None   Collection Time: 12/22/16  9:05 PM  Result Value Ref Range Status   Specimen Description BLOOD RIGHT HAND  Final   Special Requests   Final    BOTTLES DRAWN AEROBIC AND ANAEROBIC Blood Culture results may not be optimal due to an inadequate volume of blood received in culture bottles   Culture NO GROWTH 5 DAYS  Final   Report Status 12/27/2016 FINAL  Final    Medical History: Past Medical History:  Diagnosis Date  . Acute blood loss anemia 02/25/2014   Status post transfusion  . Acute renal failure ([place]) 09/2013   Pre-renal- resolved  . Anasarca 10/10/2013  . Anxiety   . Bipolar disorder ([place]) 12/04/2013   2007-SEEN IN ED FOR INVOLUNTARY COMMITMENT, UDS POS FOR AMPHETAMINES/OPIATES   . Bleeding esophageal varices ([place]) 02/28/2014   s/p banding  . C. difficile colitis 04/19/2014  . Chronic hypotension   . Cirrhosis ([place]) 10/05/13   Liver bx 11/23/13 (delayed initially due to patient refusal). c/w steatohepatitis  . Cirrhosis of liver with ascites ([place])   . Depression   . ESRD (end stage renal disease) on dialysis ([place]) 08/2014  . Folate deficiency 09/2013  . Gastroesophageal junction ulcer 09/17/2014  . GERD (gastroesophageal reflux disease)   . Hematemesis/vomiting blood 02/24/2014  . Macrocytosis 02/28/2014  . PNA (pneumonia) 10/13/2013  . SBP (spontaneous bacterial peritonitis) ([place])  11/10/2013  . Thrombocytopenia ([place])    Hypercellular bone marrow; abundant megakaryocytes per 08/27/2014; s/p bone marrow bx    Medications:  Prescriptions Prior to Admission  Medication Sig Dispense Refill Last Dose  . clotrimazole (LOTRIMIN) 1 % cream Apply 1 application topically 3 (three) times daily. 12 g 1   . Darbepoetin Alfa (ARANESP) 60 MCG/0.3ML SOSY injection Inject 0.3 mLs (60 mcg total) into the vein every Thursday with hemodialysis. (Patient taking differently: Inject 60 mcg into the vein every Tuesday with hemodialysis. ) 4.2 mL  12/22/2016 at Unknown time  . lactulose  (CHRONULAC) 10 GM/15ML solution Take 15-30 mLs (10-20 g total) by mouth 3 (three) times daily. TAKE 15-30 ml BY MOUTH three times a day   12/22/2016 at Unknown time  . lidocaine (XYLOCAINE) 2 % solution TAKE TWO TEASPOONSFUL (10ML) BY MOUTH BEFORE MEALS AND AT BEDTIME TO PREVENT CHEST PAIN WHILE EATING 100 mL 1   . lidocaine-prilocaine (EMLA) cream Apply 1 application topically as needed (for dialysis treatments).   Past Month at Unknown time  . LORazepam (ATIVAN) 1 MG tablet Take 1 mg by mouth 2 (two) times daily as needed for anxiety (and/or back spasms).   12/22/2016 at Unknown time  . midodrine (PROAMATINE) 10 MG tablet Take 1 tablet (10 mg total) by mouth 3 (three) times daily.   12/22/2016 at Unknown time  . omeprazole (PRILOSEC) 20 MG capsule 1 po every morning 30 minutes prior to your first meal. (Patient taking differently: Take 20 mg by mouth daily before breakfast. 1 po every morning 30 minutes prior to your first meal.) 31 capsule 11 12/21/2016 at Unknown time  . Oxycodone HCl 10 MG TABS Take 0.5 tablets (5 mg total) by mouth 3 (three) times daily as needed. 1 PO TID AS NEEDED FOR PAIN (Patient taking differently: Take 5-10 mg by mouth 3 (three) times daily as needed. ) 1 tablet 0 12/22/2016 at Unknown time  . RENVELA 800 MG tablet Take 1600 mg by mouth 3 times daily with meals. Take 800 mg by mouth with snacks.  5 12/21/2016 at Unknown time  . sucralfate (CARAFATE) 1 GM/10ML suspension Take 1 g by mouth 4 (four) times daily -  with meals and at bedtime.   12/21/2016 at Unknown time  . augmented betamethasone dipropionate (DIPROLENE-AF) 0.05 % cream APPLY TO THE AFFECTED AREAS ON LEGS TWICE DAILY AS NEEDED (NOT FACE, GROIN, OR UNDER ARMS)  3   . midodrine (PROAMATINE) 5 MG tablet TAKE TWO (2) TABLETS BY MOUTH DAILY. TAKE ONLY ON DIALYSIS DAYS ON TUESDAYS, THURDAYS, AND SATURDAYS (SOMETIME TAKES TREATMENTS ON FRIDAY IN  3    Scheduled:  . lactulose  30 g Per Tube Once  . lactulose  30 g Oral QID   . sodium chloride flush  3 mL Intravenous Q12H   Infusions:  . sodium chloride    . piperacillin-tazobactam    . vancomycin     PRN: sodium chloride, ondansetron **OR** ondansetron (ZOFRAN) IV, sodium chloride flush Anti-infectives    Start     Dose/Rate Route Frequency Ordered Stop   01/12/17 1630  vancomycin (VANCOCIN) IVPB 1000 mg/200 mL premix     1,000 mg 200 mL/hr over 60 Minutes Intravenous  Once 01/12/17 1627     01/12/17 1630  piperacillin-tazobactam (ZOSYN) IVPB 3.375 g     3.375 g 100 mL/hr over 30 Minutes Intravenous  Once 01/12/17 1627        Assessment: 41 yo female with hx ESRD on  hemodialysis, however has not had dialysis for 2 weeks. AMS, starting vanc, zosyn for cellulitis, wound infection: Bullae noted in bilateral lower extremities, with one bulla ruptured in  right lower extremity with foul-smelling discharge , surrounding skin is erythematous and warm to touch.   Goal of Therapy:  Vancomycin trough level 15-20 mcg/ml- pre-HD level  Plan:  Preliminary review of pertinent patient information completed.  Protocol will be initiated with dose(s) of vancomycin 1 gram as entered by ED MD (55kgx0.9L/kgx25mg /L=1238 mg loading dose) and zosyn 3.375 grams x 1.  Forestine Na clinical pharmacist will complete review during morning rounds to assess patient and finalize treatment regimen if needed.  Nyra Capes, [facility] 01/13/2017,12:21 AM

## 2017-01-13 NOTE — Op Note (Addendum)
[facility] Patient Name: Wanda [name] Procedure Date: 01/13/2017 7:09 AM MRN: [id number] Date of Birth: 28-Jun-1976 Attending MD: Barney Drain , MD CSN: [id number] Age: 41 Admit Type: Inpatient Procedure:                Upper GI endoscopy, DIAGNOSTIC Indications:              Gastrointestinal bleeding of unknown origin Providers:                Barney Drain, MD, Otis Peak B. Sharon Seller, RN, Janeece Riggers, RN, Randa Spike, Technician Referring MD:             [name] MD, MD Medicines:                None Complications:            No immediate complications. Estimated Blood Loss:     Estimated blood loss: none. Procedure:                Pre-Anesthesia Assessment:                           - Prior to the procedure, a History and Physical                            was performed, and patient medications and                            allergies were reviewed. The patient's tolerance of                            previous anesthesia was also reviewed. The risks                            and benefits of the procedure and the sedation                            options and risks were discussed with the patient.                            All questions were answered, and informed consent                            was obtained. Prior Anticoagulants: The patient has                            taken no previous anticoagulant or antiplatelet                            agents. ASA Grade Assessment: V - A moribund                            patient who is not expected to survive without the  operation. After reviewing the risks and benefits,                            the patient was deemed in satisfactory condition to                            undergo the procedure.                           After obtaining informed consent, the endoscope was                            passed under direct vision. Throughout the                             procedure, the patient's blood pressure, pulse, and                            oxygen saturations were monitored continuously. The                            [id number] ([id number]) scope was introduced through the                            mouth, and advanced to the second part of duodenum.                            The upper GI endoscopy was accomplished without                            difficulty. The patient tolerated the procedure                            well. Scope In: 7:45:25 AM Scope Out: 7:55:19 AM Total Procedure Duration: 0 hours 9 minutes 54 seconds  Findings:      Non-severe esophagitis with no bleeding was found. NO VARICES SEEN.      Patchy mild inflammation characterized by congestion (edema) and       erythema was found in the gastric antrum. LIMITED EXAM OF CARDIA. NO       BRIGHT RED BLOOD IN CARDIA.      The examined duodenum was normal. NO OLD BLOOD OR FRESH BLOOD SEEN IN       DUODENUM Impression:               - Non-severe reflux esophagitis.                           - MILD Gastritis.                           - BLOODY RETURN FROM NG IS DUE TO EPISTAXIS IN                            SETTING OF THROMOBOCYTOPENIA/PLATELET DYSFUNCTION  FROM UREMIA Moderate Sedation:      Moderate (conscious) sedation was WAS NOT ADMINISTERED. The following       parameters were monitored: oxygen saturation, heart rate, blood       pressure, and response to care. Total physician intraservice time was 10       minutes. Recommendation:           - Return patient to ICU for ongoing care.                           - NPO.                           - Continue present medications. STOP OCTEROTIDE.                           - PROTONIX 40 MG IV BID                           - LACTULOSE VIA OG Q6H AND ENEMAS 300 MG PR Q6H                           - NEPHROLOGY CONSULT FOR DIALYSIS                           - SUPPORTIVE CARE. GRAVE PROGNOSIS. PALLIATIVE CARE                             CONSULT.                           - PLATELET TRANSFUSION Procedure Code(s):        --- Professional ---                           [phone], Esophagogastroduodenoscopy, flexible,                            transoral; diagnostic, including collection of                            specimen(s) by brushing or washing, when performed                            (separate procedure) Diagnosis Code(s):        --- Professional ---                           K21.0, Gastro-esophageal reflux disease with                            esophagitis                           K29.70, Gastritis, unspecified, without bleeding                           K92.2, Gastrointestinal hemorrhage, unspecified CPT copyright 2016 American Medical Association.  All rights reserved. The codes documented in this report are preliminary and upon coder review may  be revised to meet current compliance requirements. Barney Drain, MD Barney Drain, MD 01/13/2017 8:14:35 AM This report has been signed electronically. Number of Addenda: 0

## 2017-01-13 NOTE — Consult Note (Signed)
Consult requested by: [facility], Dr. 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 She was found at home unresponsive and apparently had not had her dialysis for about 2 weeks. EMS was called. She was found to be hypothermic had ammonia level of 311 BUN of 120 creatinine of 20.34 up to 100 moaning. She started bleeding early this morning and was intubated for airway protection and that is ongoing. She had EGD that did not show any GI tract bleeding and it looks like this may be from her nose. She is scheduled for dialysis this morning. History is obtained from the medical record because she is intubated and there is no family at bedside  Past Medical History:  Diagnosis Date  . Acute blood loss anemia 02/25/2014   Status post transfusion  . Acute renal failure ([place]) 09/2013   Pre-renal- resolved  . Anasarca 10/10/2013  . Anxiety   . Bipolar disorder ([place]) 12/04/2013   2007-SEEN IN ED FOR INVOLUNTARY COMMITMENT, UDS POS FOR AMPHETAMINES/OPIATES   . Bleeding esophageal varices ([place]) 02/28/2014   s/p banding  . C. difficile colitis 04/19/2014  . Chronic hypotension   . Cirrhosis ([place]) 10/05/13   Liver bx 11/23/13 (delayed initially due to patient refusal). c/w steatohepatitis  . Cirrhosis of liver with ascites ([place])   . Depression   . ESRD (end stage renal disease) on dialysis ([place]) 08/2014  . Folate deficiency 09/2013  . Gastroesophageal junction ulcer 09/17/2014  . GERD (gastroesophageal reflux disease)   . Hematemesis/vomiting blood 02/24/2014  . Macrocytosis 02/28/2014  . PNA (pneumonia) 10/13/2013  . SBP (spontaneous bacterial peritonitis) ([place]) 11/10/2013  . Thrombocytopenia ([place])    Hypercellular bone marrow; abundant megakaryocytes  per 08/27/2014; s/p bone marrow bx     Family History  Problem Relation Age of Onset  . Heart disease Mother   . Colon cancer Neg Hx   . Liver disease Neg Hx      Social History   Social History  . Marital status: Single    Spouse name: N/A  . Number of children: 1  . Years of education: N/A   Occupational History  . unemployed    Social History Main Topics  . Smoking status: Former Smoker    Packs/day: 0.25    Years: 20.00    Types: Cigarettes    Quit date: 11/05/2008  . Smokeless tobacco: Never Used     Comment: Never really smoked much  . Alcohol use No  . Drug use: No  . Sexual activity: Not Currently    Partners: Male    Birth control/ protection: None   Other Topics Concern  . None   Social History Narrative  . None     ROS: Unobtainable    Objective: Vital signs in last 24 hours: Temp:  [95.5 F (35.3 C)-98.6 F (37 C)] 96.6 F (35.9 C) (06/06 0700) Pulse Rate:  [101-131] 131 (06/06 0700) Resp:  [14-33] 14 (06/06 0700) BP: (127-204)/(54-105) 193/61 (06/06 0700) SpO2:  [90 %-99 %] 99 % (06/06 0700) FiO2 (%):  [40 %-100 %] 40 % (06/06 0854) Weight:  [71.2 kg (157 lb)-72.5 kg (159 lb 13.3 oz)] 72.5 kg (159 lb 13.3 oz) (06/06 0200) Weight change:  Last BM Date: 01/13/17  Intake/Output from previous day: No intake/output data recorded.  PHYSICAL EXAM Constitutional:  She is intubated sedated on mechanical ventilation. She has blood coming from her nose. Eyes, pupils react. Ears nose mouth and throat: She has blood in her nose. Throat shows blood in the back of her throat also. Cardiovascular: She has normal rhythm with tachycardia. Respiratory: She has bilateral rhonchi. Gastrointestinal: Her abdomen is soft do not feel her liver. Skin: Pale musculoskeletal: Unable to assess. Neurological: Unable to assess: Psychiatric: Unable to assess  Lab Results: Basic Metabolic Panel:  Recent Labs  01/12/17 1824 01/13/17 0447  NA 140 140  K 5.7* 5.8*  CL  101 101  CO2 14* 14*  GLUCOSE 121* 99  BUN 120* 132*  CREATININE 20.34* 20.48*  CALCIUM 7.9* 8.0*   Liver Function Tests:  Recent Labs  01/12/17 1824 01/13/17 0447  AST 37 36  ALT 15 17  ALKPHOS 93 109  BILITOT 0.8 1.0  PROT 6.3* 6.8  ALBUMIN 2.8* 3.0*    Recent Labs  01/12/17 1824  LIPASE 64*    Recent Labs  01/12/17 1824 01/13/17 0447  AMMONIA 311* 205*   CBC:  Recent Labs  01/12/17 1824 01/13/17 0447  WBC 6.6 10.5  NEUTROABS 5.5  --   HGB 8.1* 8.3*  HCT 24.7* 24.8*  MCV 91.1 90.5  PLT 82* 110*   Cardiac Enzymes:  Recent Labs  01/12/17 1824  TROPONINI <0.03   BNP: No results for input(s): PROBNP in the last 72 hours. D-Dimer: No results for input(s): DDIMER in the last 72 hours. CBG:  Recent Labs  01/13/17 0806  GLUCAP 125*   Hemoglobin A1C: No results for input(s): HGBA1C in the last 72 hours. Fasting Lipid Panel: No results for input(s): CHOL, HDL, LDLCALC, TRIG, CHOLHDL, LDLDIRECT in the last 72 hours. Thyroid Function Tests: No results for input(s): TSH, T4TOTAL, FREET4, T3FREE, THYROIDAB in the last 72 hours. Anemia Panel: No results for input(s): VITAMINB12, FOLATE, FERRITIN, TIBC, IRON, RETICCTPCT in the last 72 hours. Coagulation:  Recent Labs  01/12/17 1824 01/13/17 0825  LABPROT 15.2 15.7*  INR 1.19 1.24   Urine Drug Screen: Drugs of Abuse     Component Value Date/Time   LABOPIA NONE DETECTED 01/12/2017 1650   COCAINSCRNUR NONE DETECTED 01/12/2017 1650   LABBENZ POSITIVE (A) 01/12/2017 1650   AMPHETMU NONE DETECTED 01/12/2017 1650   THCU NONE DETECTED 01/12/2017 1650   LABBARB NONE DETECTED 01/12/2017 1650    Alcohol Level:  Recent Labs  01/12/17 1824  ETH <5   Urinalysis:  Recent Labs  01/12/17 1650  COLORURINE YELLOW  LABSPEC 1.013  PHURINE 6.0  GLUCOSEU NEGATIVE  HGBUR SMALL*  BILIRUBINUR NEGATIVE  KETONESUR NEGATIVE  PROTEINUR 30*  NITRITE NEGATIVE  LEUKOCYTESUR NEGATIVE   Misc.  Labs:   ABGS:  Recent Labs  01/12/17 1723 01/13/17 0845  PHART  --  7.267*  PO2ART  --  295*  TCO2 17  --   HCO3  --  15.3*     MICROBIOLOGY: Recent Results (from the past 240 hour(s))  Wound or Superficial Culture     Status: None (Preliminary result)   Collection Time: 01/12/17  5:50 PM  Result Value Ref Range Status   Specimen Description LEG RIGHT  Final   Special Requests Immunocompromised  Final   Gram Stain   Final    MODERATE WBC PRESENT, PREDOMINANTLY PMN ABUNDANT GRAM NEGATIVE RODS Performed at [facility], [facility] [address] [place], [place] [address]    Culture PENDING  Incomplete   Report Status PENDING  Incomplete  Culture, blood (Routine X 2) w Reflex to ID Panel     Status: None (Preliminary result)   Collection Time: 01/12/17  6:24 PM  Result Value Ref Range Status   Specimen Description BLOOD LEFT HAND  Final   Special Requests Blood Culture adequate volume  Final   Culture NO GROWTH < 12 HOURS  Final   Report Status PENDING  Incomplete  Culture, blood (Routine X 2) w Reflex to ID Panel     Status: None (Preliminary result)   Collection Time: 01/13/17  4:47 AM  Result Value Ref Range Status   Specimen Description BLOOD PICC LINE  Final   Special Requests   Final    BOTTLES DRAWN AEROBIC AND ANAEROBIC Blood Culture results may not be optimal due to an excessive volume of blood received in culture bottles AEROBIC BOTTLE Blood Culture results may not be optimal due to an inadequate volume of blood received in culture  bottles ANAEROBIC BOTTLE    Culture NO GROWTH < 12 HOURS  Final   Report Status PENDING  Incomplete    Studies/Results: Dg Chest 1v Repeat Same Day  Result Date: 01/12/2017 CLINICAL DATA:  PICC line placement EXAM: CHEST - 1 VIEW SAME DAY COMPARISON:  1959 hours on the same day FINDINGS: Left-sided PICC line tip projects over the expected location of the proximal right atrium. Pullback approximately 2 cm. Stable cardiomegaly with  aortic atherosclerosis and mild CHF. Low lung volumes are noted. No acute nor suspicious osseous lesions. IMPRESSION: 1. Tip of left-sided PICC line tip projects over the proximal right atrium. Pullback approximately 2 cm. 2. Cardiomegaly with aortic atherosclerosis and mild CHF. Electronically Signed   By: Ashley Royalty M.D.   On: 01/12/2017 22:44   Dg Chest Port 1 View  Result Date: 01/13/2017 CLINICAL DATA:  Status post orogastric tube placement. EXAM: PORTABLE CHEST 1 VIEW COMPARISON:  Chest x-ray of earlier today. FINDINGS: The orogastric tube tip projects at approximately the level of the pylorus or proximal duodenum. The proximal port projects in the distal gastric body. There are loops of mildly distended gas-filled small bowel in the left mid abdomen. IMPRESSION: Reasonable positioning of the orogastric tube. Electronically Signed   By: David  Martinique M.D.   On: 01/13/2017 09:13   Dg Chest Portable 1 View  Result Date: 01/13/2017 CLINICAL DATA:  Intubation. EXAM: PORTABLE CHEST 1 VIEW COMPARISON:  01/12/2017. FINDINGS: Endotracheal tube tip 1.9 cm above the lower portion of the carina. NG tube tip below left hemidiaphragm. Left PICC line in stable position. Cardiomegaly with pulmonary vascular congestion again noted without interim change. Low lung volumes with mild basilar atelectasis. No pneumothorax. IMPRESSION: 1. Endotracheal tube tip 1.9 cm above the lower portion of the carina. NG tube tip below left hemidiaphragm. Left PICC line stable position. 2. Cardiomegaly with pulmonary vascular congestion again noted. No interim change. 3. Low lung volumes with basilar atelectasis. Electronically Signed   By: Marcello Moores  Register   On: 01/13/2017 06:41   Dg Chest Port 1 View  Result Date: 01/12/2017 CLINICAL DATA:  Altered mentation and wheeze EXAM: PORTABLE CHEST 1 VIEW COMPARISON:  12/22/2016 FINDINGS: Borderline cardiomegaly with aortic atherosclerosis. Slight interval increase in pulmonary vascular  congestion consistent mild CHF. No pneumonic consolidation, effusion or pneumothorax. Minimal atelectasis and/or scarring at the right lung base. No acute nor suspicious osseous lesions. IMPRESSION: Interval increase in pulmonary vascular congestion consistent with mild CHF. Stable aortic atherosclerosis Electronically Signed   By: Meredith Leeds.D.  On: 01/12/2017 17:57   Dg Chest Port 1v Same Day  Result Date: 01/12/2017 CLINICAL DATA:  PICC line placement EXAM: PORTABLE CHEST 1 VIEW COMPARISON:  01/12/2017 FINDINGS: Left upper extremity catheter tip directed cephalad. Stable borderline cardiomegaly. Mild central vascular calcification. No focal consolidation or effusion. Aortic atherosclerosis. IMPRESSION: 1. Left upper extremity catheter tip directed cephalad, possibly within the azygos system. Repositioning suggested 2. Cardiomegaly with slight worsens in central vascular congestion. Electronically Signed   By: Donavan Foil M.D.   On: 01/12/2017 20:22    Medications:  Prior to Admission:  Prescriptions Prior to Admission  Medication Sig Dispense Refill Last Dose  . clotrimazole (LOTRIMIN) 1 % cream Apply 1 application topically 3 (three) times daily. 12 g 1   . Darbepoetin Alfa (ARANESP) 60 MCG/0.3ML SOSY injection Inject 0.3 mLs (60 mcg total) into the vein every Thursday with hemodialysis. (Patient taking differently: Inject 60 mcg into the vein every Tuesday with hemodialysis. ) 4.2 mL  12/22/2016 at Unknown time  . lactulose (CHRONULAC) 10 GM/15ML solution Take 15-30 mLs (10-20 g total) by mouth 3 (three) times daily. TAKE 15-30 ml BY MOUTH three times a day   12/22/2016 at Unknown time  . lidocaine (XYLOCAINE) 2 % solution TAKE TWO TEASPOONSFUL (10ML) BY MOUTH BEFORE MEALS AND AT BEDTIME TO PREVENT CHEST PAIN WHILE EATING 100 mL 1   . lidocaine-prilocaine (EMLA) cream Apply 1 application topically as needed (for dialysis treatments).   Past Month at Unknown time  . LORazepam (ATIVAN) 1 MG  tablet Take 1 mg by mouth 2 (two) times daily as needed for anxiety (and/or back spasms).   12/22/2016 at Unknown time  . midodrine (PROAMATINE) 10 MG tablet Take 1 tablet (10 mg total) by mouth 3 (three) times daily.   12/22/2016 at Unknown time  . omeprazole (PRILOSEC) 20 MG capsule 1 po every morning 30 minutes prior to your first meal. (Patient taking differently: Take 20 mg by mouth daily before breakfast. 1 po every morning 30 minutes prior to your first meal.) 31 capsule 11 12/21/2016 at Unknown time  . Oxycodone HCl 10 MG TABS Take 0.5 tablets (5 mg total) by mouth 3 (three) times daily as needed. 1 PO TID AS NEEDED FOR PAIN (Patient taking differently: Take 5-10 mg by mouth 3 (three) times daily as needed. ) 1 tablet 0 12/22/2016 at Unknown time  . RENVELA 800 MG tablet Take 1600 mg by mouth 3 times daily with meals. Take 800 mg by mouth with snacks.  5 12/21/2016 at Unknown time  . sucralfate (CARAFATE) 1 GM/10ML suspension Take 1 g by mouth 4 (four) times daily -  with meals and at bedtime.   12/21/2016 at Unknown time  . augmented betamethasone dipropionate (DIPROLENE-AF) 0.05 % cream APPLY TO THE AFFECTED AREAS ON LEGS TWICE DAILY AS NEEDED (NOT FACE, GROIN, OR UNDER ARMS)  3   . midodrine (PROAMATINE) 5 MG tablet TAKE TWO (2) TABLETS BY MOUTH DAILY. TAKE ONLY ON DIALYSIS DAYS ON TUESDAYS, THURDAYS, AND SATURDAYS (SOMETIME TAKES TREATMENTS ON FRIDAY IN  3    Scheduled: . etomidate      . lactulose  30 g Oral QID  . lactulose  30 g Per Tube Once  . lactulose  300 mL Rectal Q6H  . lidocaine      . meperidine      . midazolam      . pantoprazole (PROTONIX) IV  40 mg Intravenous BID AC  . sodium chloride flush  3  mL Intravenous Q12H  . vecuronium       Continuous: . sodium chloride    . sodium chloride    . fentaNYL infusion INTRAVENOUS    . piperacillin-tazobactam (ZOSYN)  IV     JQB:HALPFX chloride, ondansetron **OR** ondansetron (ZOFRAN) IV, sodium chloride flush  Assesment: She  has altered mental status likely related to uremia. She has thrombocytopenia at baseline and her bleeding seems to be from her upper airway. Hemoglobin level was around 8 earlier today and that will need to be repeated. She is intubated for airway protection. She certainly could have aspirated during all of this episode. Agree with Zosyn. 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 Continue with antibiotics. Dialysis as possible. She may need a blood transfusion. She is going to get a platelet transfusion.    LOS: 1 day   Orien Mayhall L 01/13/2017, 9:22 AM

## 2017-01-14 ENCOUNTER — Inpatient Hospital Stay (HOSPITAL_COMMUNITY): Payer: Medicare Other

## 2017-01-14 DIAGNOSIS — D62 Acute posthemorrhagic anemia: Secondary | ICD-10-CM

## 2017-01-14 DIAGNOSIS — R4182 Altered mental status, unspecified: Secondary | ICD-10-CM

## 2017-01-14 LAB — PREPARE PLATELET PHERESIS: Unit division: 0

## 2017-01-14 LAB — GLUCOSE, CAPILLARY
GLUCOSE-CAPILLARY: 115 mg/dL — AB (ref 65–99)
GLUCOSE-CAPILLARY: 121 mg/dL — AB (ref 65–99)
Glucose-Capillary: 111 mg/dL — ABNORMAL HIGH (ref 65–99)
Glucose-Capillary: 115 mg/dL — ABNORMAL HIGH (ref 65–99)
Glucose-Capillary: 117 mg/dL — ABNORMAL HIGH (ref 65–99)
Glucose-Capillary: 121 mg/dL — ABNORMAL HIGH (ref 65–99)
Glucose-Capillary: 123 mg/dL — ABNORMAL HIGH (ref 65–99)

## 2017-01-14 LAB — COMPREHENSIVE METABOLIC PANEL
ALT: 18 U/L (ref 14–54)
ANION GAP: 19 — AB (ref 5–15)
AST: 38 U/L (ref 15–41)
Albumin: 2.7 g/dL — ABNORMAL LOW (ref 3.5–5.0)
Alkaline Phosphatase: 88 U/L (ref 38–126)
BUN: 69 mg/dL — AB (ref 6–20)
CO2: 23 mmol/L (ref 22–32)
Calcium: 9 mg/dL (ref 8.9–10.3)
Chloride: 103 mmol/L (ref 101–111)
Creatinine, Ser: 12.1 mg/dL — ABNORMAL HIGH (ref 0.44–1.00)
GFR calc Af Amer: 4 mL/min — ABNORMAL LOW (ref >60)
GFR, EST NON AFRICAN AMERICAN: 3 mL/min — AB (ref >60)
Glucose, Bld: 125 mg/dL — ABNORMAL HIGH (ref 65–99)
POTASSIUM: 3.9 mmol/L (ref 3.5–5.1)
Sodium: 145 mmol/L (ref 135–145)
TOTAL PROTEIN: 6.1 g/dL — AB (ref 6.5–8.1)
Total Bilirubin: 1.4 mg/dL — ABNORMAL HIGH (ref 0.3–1.2)

## 2017-01-14 LAB — BLOOD GAS, ARTERIAL
Acid-base deficit: 1.3 mmol/L (ref 0.0–2.0)
Bicarbonate: 23.5 mmol/L (ref 20.0–28.0)
Drawn by: 234301
FIO2: 30
O2 SAT: 98.6 %
PEEP: 5 cmH2O
Patient temperature: 37
RATE: 14 resp/min
VT: 500 mL
pCO2 arterial: 29.5 mmHg — ABNORMAL LOW (ref 32.0–48.0)
pH, Arterial: 7.481 — ABNORMAL HIGH (ref 7.350–7.450)
pO2, Arterial: 127 mmHg — ABNORMAL HIGH (ref 83.0–108.0)

## 2017-01-14 LAB — BPAM PLATELET PHERESIS
BLOOD PRODUCT EXPIRATION DATE: 201806071158
ISSUE DATE / TIME: 201806061227
Unit Type and Rh: 6200

## 2017-01-14 LAB — CBC WITH DIFFERENTIAL/PLATELET
Basophils Absolute: 0 10*3/uL (ref 0.0–0.1)
Basophils Relative: 0 %
Eosinophils Absolute: 0.1 10*3/uL (ref 0.0–0.7)
Eosinophils Relative: 1 %
HCT: 19.5 % — ABNORMAL LOW (ref 36.0–46.0)
Hemoglobin: 6.2 g/dL — CL (ref 12.0–15.0)
LYMPHS PCT: 13 %
Lymphs Abs: 1.2 10*3/uL (ref 0.7–4.0)
MCH: 29.7 pg (ref 26.0–34.0)
MCHC: 31.8 g/dL (ref 30.0–36.0)
MCV: 93.3 fL (ref 78.0–100.0)
MONO ABS: 0.8 10*3/uL (ref 0.1–1.0)
MONOS PCT: 9 %
NEUTROS ABS: 7.3 10*3/uL (ref 1.7–7.7)
Neutrophils Relative %: 77 %
Platelets: 110 10*3/uL — ABNORMAL LOW (ref 150–400)
RBC: 2.09 MIL/uL — ABNORMAL LOW (ref 3.87–5.11)
RDW: 18.3 % — AB (ref 11.5–15.5)
WBC: 9.4 10*3/uL (ref 4.0–10.5)

## 2017-01-14 LAB — AMMONIA: Ammonia: 27 umol/L (ref 9–35)

## 2017-01-14 LAB — HEMOGLOBIN AND HEMATOCRIT, BLOOD
HEMATOCRIT: 24.1 % — AB (ref 36.0–46.0)
HEMOGLOBIN: 8.1 g/dL — AB (ref 12.0–15.0)

## 2017-01-14 MED ORDER — FENTANYL 2500MCG IN NS 250ML (10MCG/ML) PREMIX INFUSION
INTRAVENOUS | Status: AC
  Filled 2017-01-14: qty 250

## 2017-01-14 MED ORDER — DIPHENHYDRAMINE HCL 50 MG/ML IJ SOLN
25.0000 mg | Freq: Once | INTRAMUSCULAR | Status: AC
  Administered 2017-01-14: 25 mg via INTRAVENOUS
  Filled 2017-01-14: qty 1

## 2017-01-14 MED ORDER — LACTULOSE 10 GM/15ML PO SOLN
30.0000 g | Freq: Four times a day (QID) | ORAL | Status: DC
  Administered 2017-01-14 – 2017-01-15 (×5): 30 g
  Filled 2017-01-14 (×5): qty 60

## 2017-01-14 MED ORDER — SODIUM CHLORIDE 0.9 % IV SOLN
Freq: Once | INTRAVENOUS | Status: AC
  Administered 2017-01-14: 10:00:00 via INTRAVENOUS

## 2017-01-14 MED ORDER — VANCOMYCIN HCL IN DEXTROSE 750-5 MG/150ML-% IV SOLN
750.0000 mg | INTRAVENOUS | Status: DC
  Administered 2017-01-15: 750 mg via INTRAVENOUS
  Filled 2017-01-14 (×2): qty 150

## 2017-01-14 NOTE — Progress Notes (Signed)
PROGRESS NOTE    Wanda [name]  IEP:[id number] DOB: 06-25-1976 DOA: 01/12/2017 PCP: [name], MD    Brief Narrative: 41 yo with ESRD on HD, NASH, non compliance, thrombocytopenia, admitted for AMS, elevated NH3 to 300's, nasal bleeding, intubated for airway protection and lower extremities cellulitis.  She has been intubated for airways protection. She had EGD showing no GI bleed, and HD yesterday.  She is more alert today, blinking her eyes to answer questions.   Assessment & Plan:   Active Problems:   Liver cirrhosis secondary to nonalcoholic steatohepatitis (NASH) (HCC)   Thrombocytopenia (HCC)   End stage renal disease on dialysis (HCC)   Bullous skin disease   Altered mental status   Cellulitis    1. Altered mental status-  Likely due to hepatic encephalopathy.  Continue with Lactulose.  WIll follow up NH3 level.  She appears to be better today.  2. End-stage renal disease- patient missed hemodialysis for past 2 weeks, potassium is 5.7, BUN 120, creatinine 20.34.  She received dialysis yesterday. 3. Cellulitis/wound infection-patient has bullous skin disease, presenting with cellulitis and infected bulla of the right lower extremity. Wound culture has been obtained. Patient started on vancomycin and Zosyn as above. Wound care consultation pending.  4. Chronic thrombocytopenia- likely from liver cirrhosis due to [facility], follow platelet count in a.m. 5. Liver cirrhosis, secondary to [place]- liver enzymes are stable, blood pressure stable. Hold oral medications due to altered mental status. Consider restarting these medications in a.m. once patient is more alert. 6    Intubation:  Will keep vent today.  Pulmonary consultation appreciated.   7.   Nasal bleeding;  SCD.  D/C protonix drip. D/C octreotide.  Q12 PPI.   DVT prophylaxis: SCD. Code Status: FULL CODE.  Family Communication: Son updated.  Disposition Plan: TBD.  Consultants:  GI and Pulmonary.  Procedures:    Intubation  EGD.   Antimicrobials: Anti-infectives    Start     Dose/Rate Route Frequency Ordered Stop   01/15/17 1200  vancomycin (VANCOCIN) IVPB 750 mg/150 ml premix     750 mg 150 mL/hr over 60 Minutes Intravenous Every M-W-F (Hemodialysis) 01/14/17 0758     01/13/17 1800  vancomycin (VANCOCIN) IVPB 750 mg/150 ml premix     750 mg 150 mL/hr over 60 Minutes Intravenous  Once 01/13/17 1046 01/13/17 1630   01/13/17 1000  piperacillin-tazobactam (ZOSYN) IVPB 3.375 g     3.375 g 12.5 mL/hr over 240 Minutes Intravenous Every 12 hours 01/13/17 0741     01/12/17 1630  vancomycin (VANCOCIN) IVPB 1000 mg/200 mL premix     1,000 mg 200 mL/hr over 60 Minutes Intravenous  Once 01/12/17 1627 01/13/17 0133   01/12/17 1630  piperacillin-tazobactam (ZOSYN) IVPB 3.375 g     3.375 g 100 mL/hr over 30 Minutes Intravenous  Once 01/12/17 1627 01/13/17 0104       Subjective:  Intubated.    Objective: Vitals:   01/14/17 0615 01/14/17 0630 01/14/17 0645 01/14/17 0700  BP: 126/67 139/66 (!) 152/72 (!) 148/69  Pulse: (!) 120 (!) 117 (!) 117 (!) 117  Resp: _0 Temp: 99.9 F (37.7 C) 99.7 F (37.6 C) 99.5 F (37.5 C) 99.3 F (37.4 C)  TempSrc:      SpO2: 100% 100% 100% 99%  Weight:      Height:        Intake/Output Summary (Last 24 hours) at 01/14/17 0758 Last data filed at 01/14/17 0700  Gross  per 24 hour  Intake          1515.57 ml  Output             2150 ml  Net          -634.43 ml   Filed Weights   01/12/17 1613 01/13/17 0200 01/13/17 1210  Weight: 71.2 kg (157 lb) 72.5 kg (159 lb 13.3 oz) 72.5 kg (159 lb 13.3 oz)    Examination:  General exam: Appears calm and comfortable  Respiratory system: Clear to auscultation. Respiratory effort normal. Cardiovascular system: S1 & S2 heard, RRR. No JVD, murmurs, rubs, gallops or clicks. No pedal edema. Gastrointestinal system: Abdomen is nondistended, soft and nontender. No organomegaly or masses felt. Normal bowel sounds  heard. Central nervous system: Alert  Extremities: Symmetric 5 x 5 power. Skin: No rashes, lesions or ulcers   Data Reviewed: I have personally reviewed following labs and imaging studies  CBC:  Recent Labs Lab 01/12/17 1723 01/12/17 1824 01/13/17 0447 01/13/17 1623  WBC  --  6.6 10.5 14.9*  NEUTROABS  --  5.5  --  12.9*  HGB 7.8* 8.1* 8.3* 8.4*  HCT 23.0* 24.7* 24.8* 25.4*  MCV  --  91.1 90.5 89.8  PLT  --  82* 110* 236   Basic Metabolic Panel:  Recent Labs Lab 01/12/17 1723 01/12/17 1824 01/13/17 0447  NA 135 140 140  K 5.8* 5.7* 5.8*  CL 104 101 101  CO2  --  14* 14*  GLUCOSE 117* 121* 99  BUN 133* 120* 132*  CREATININE >18.00* 20.34* 20.48*  CALCIUM  --  7.9* 8.0*   GFR: Estimated Creatinine Clearance: 3.5 mL/min (A) (by C-G formula based on SCr of 20.48 mg/dL (H)). Liver Function Tests:  Recent Labs Lab 01/12/17 1824 01/13/17 0447  AST 37 36  ALT 15 17  ALKPHOS 93 109  BILITOT 0.8 1.0  PROT 6.3* 6.8  ALBUMIN 2.8* 3.0*    Recent Labs Lab 01/12/17 1824  LIPASE 64*    Recent Labs Lab 01/12/17 1824 01/13/17 0447  AMMONIA 311* 205*   Coagulation Profile:  Recent Labs Lab 01/12/17 1824 01/13/17 0825  INR 1.19 1.24   Cardiac Enzymes:  Recent Labs Lab 01/12/17 1824  TROPONINI <0.03   BNP (last 3 results) No results for input(s): PROBNP in the last 8760 hours. HbA1C: No results for input(s): HGBA1C in the last 72 hours. CBG:  Recent Labs Lab 01/13/17 1630 01/13/17 2009 01/14/17 0013 01/14/17 0445 01/14/17 0741  GLUCAP 98 108* 121* 115* 115*   Sepsis Labs:  Recent Labs Lab 01/12/17 1725 01/12/17 2126  LATICACIDVEN 2.40* 1.9    Recent Results (from the past 240 hour(s))  Wound or Superficial Culture     Status: None (Preliminary result)   Collection Time: 01/12/17  5:50 PM  Result Value Ref Range Status   Specimen Description LEG RIGHT  Final   Special Requests Immunocompromised  Final   Gram Stain   Final     MODERATE WBC PRESENT, PREDOMINANTLY PMN ABUNDANT GRAM NEGATIVE RODS    Culture   Final    ABUNDANT GRAM NEGATIVE RODS CULTURE REINCUBATED FOR BETTER GROWTH Performed at [facility], [address] [place], [place] [address]    Report Status PENDING  Incomplete  Culture, blood (Routine X 2) w Reflex to ID Panel     Status: None (Preliminary result)   Collection Time: 01/12/17  6:24 PM  Result Value Ref Range Status   Specimen  Description BLOOD LEFT HAND  Final   Special Requests Blood Culture adequate volume  Final   Culture  Setup Time   Final    GRAM POSITIVE COCCI IN CLUSTERS Gram Stain Report Called to,Read Back By and Verified With: MOTLEY J. AT 1320 BY THOMPSON S. ON [id number] CRITICAL RESULT CALLED TO, READ BACK BY AND VERIFIED WITH: J. MOTLEY, RN (APH) AT 1808 ON 01/13/17 BY C. JESSUP, MLT. Performed at [facility], [address], [place], [place] [address]    Culture GRAM POSITIVE COCCI  Final   Report Status PENDING  Incomplete  Blood Culture ID Panel (Reflexed)     Status: Abnormal   Collection Time: 01/12/17  6:24 PM  Result Value Ref Range Status   Enterococcus species NOT DETECTED NOT DETECTED Final   Listeria monocytogenes NOT DETECTED NOT DETECTED Final   Staphylococcus species DETECTED (A) NOT DETECTED Final    Comment: Methicillin (oxacillin) resistant coagulase negative staphylococcus. Possible blood culture contaminant (unless isolated from more than one blood culture draw or clinical case suggests pathogenicity). No antibiotic treatment is indicated for blood  culture contaminants. CRITICAL RESULT CALLED TO, READ BACK BY AND VERIFIED WITH: J. MOTLEY, RN (APH) ON AT 1808 ON 01/13/17 BY C. JESSUP, MLT.    Staphylococcus aureus NOT DETECTED NOT DETECTED Final   Methicillin resistance DETECTED (A) NOT DETECTED Final    Comment: CRITICAL RESULT CALLED TO, READ BACK BY AND VERIFIED WITH: J. MOTLEY, RN (APH) AT 1808 ON 01/13/17 BY C. JESSUP, MLT.     Streptococcus species NOT DETECTED NOT DETECTED Final   Streptococcus agalactiae NOT DETECTED NOT DETECTED Final   Streptococcus pneumoniae NOT DETECTED NOT DETECTED Final   Streptococcus pyogenes NOT DETECTED NOT DETECTED Final   Acinetobacter baumannii NOT DETECTED NOT DETECTED Final   Enterobacteriaceae species NOT DETECTED NOT DETECTED Final   Enterobacter cloacae complex NOT DETECTED NOT DETECTED Final   Escherichia coli NOT DETECTED NOT DETECTED Final   Klebsiella oxytoca NOT DETECTED NOT DETECTED Final   Klebsiella pneumoniae NOT DETECTED NOT DETECTED Final   Proteus species NOT DETECTED NOT DETECTED Final   Serratia marcescens NOT DETECTED NOT DETECTED Final   Haemophilus influenzae NOT DETECTED NOT DETECTED Final   Neisseria meningitidis NOT DETECTED NOT DETECTED Final   Pseudomonas aeruginosa NOT DETECTED NOT DETECTED Final   Candida albicans NOT DETECTED NOT DETECTED Final   Candida glabrata NOT DETECTED NOT DETECTED Final   Candida krusei NOT DETECTED NOT DETECTED Final   Candida parapsilosis NOT DETECTED NOT DETECTED Final   Candida tropicalis NOT DETECTED NOT DETECTED Final    Comment: Performed at [facility], [address] [place], [place] [address]  MRSA PCR Screening     Status: None   Collection Time: 01/13/17 12:09 AM  Result Value Ref Range Status   MRSA by PCR NEGATIVE NEGATIVE Final    Comment:        The GeneXpert MRSA Assay (FDA approved for NASAL specimens only), is one component of a comprehensive MRSA colonization surveillance program. It is not intended to diagnose MRSA infection nor to guide or monitor treatment for MRSA infections.   Culture, blood (Routine X 2) w Reflex to ID Panel     Status: None (Preliminary result)   Collection Time: 01/13/17  4:47 AM  Result Value Ref Range Status   Specimen Description BLOOD PICC LINE  Final   Special Requests   Final    BOTTLES DRAWN AEROBIC AND ANAEROBIC Blood  Culture results may not be optimal  due to an excessive volume of blood received in culture bottles AEROBIC BOTTLE Blood Culture results may not be optimal due to an inadequate volume of blood received in culture  bottles ANAEROBIC BOTTLE    Culture NO GROWTH 1 DAY  Final   Report Status PENDING  Incomplete     Radiology Studies: Ct Head Wo Contrast  Result Date: 01/13/2017 CLINICAL DATA:  Altered mental status.  Ventilator dependence. EXAM: CT HEAD WITHOUT CONTRAST TECHNIQUE: Contiguous axial images were obtained from the base of the skull through the vertex without intravenous contrast. COMPARISON:  09/29/2016 FINDINGS: Brain: There is no evidence for acute hemorrhage, hydrocephalus, mass lesion, or abnormal extra-axial fluid collection. No definite CT evidence for acute infarction. Vascular: No hyperdense vessel or unexpected calcification. Skull: No evidence for fracture. No worrisome lytic or sclerotic lesion. Sinuses/Orbits: Mucosal disease in the ethmoid air cells and maxillary sinus has progressed in the interval. Visualized portions of the globes and intraorbital fat are unremarkable. Other: None. IMPRESSION: 1. No acute intracranial abnormality. 2. Interval development of paranasal sinus disease, nonspecific in the setting of intubation. Electronically Signed   By: Misty Stanley M.D.   On: 01/13/2017 11:33   Dg Chest 1v Repeat Same Day  Result Date: 01/12/2017 CLINICAL DATA:  PICC line placement EXAM: CHEST - 1 VIEW SAME DAY COMPARISON:  1959 hours on the same day FINDINGS: Left-sided PICC line tip projects over the expected location of the proximal right atrium. Pullback approximately 2 cm. Stable cardiomegaly with aortic atherosclerosis and mild CHF. Low lung volumes are noted. No acute nor suspicious osseous lesions. IMPRESSION: 1. Tip of left-sided PICC line tip projects over the proximal right atrium. Pullback approximately 2 cm. 2. Cardiomegaly with aortic atherosclerosis and mild CHF. Electronically Signed   By: Ashley Royalty M.D.   On: 01/12/2017 22:44   Dg Chest Port 1 View  Result Date: 01/13/2017 CLINICAL DATA:  Status post orogastric tube placement. EXAM: PORTABLE CHEST 1 VIEW COMPARISON:  Chest x-ray of earlier today. FINDINGS: The orogastric tube tip projects at approximately the level of the pylorus or proximal duodenum. The proximal port projects in the distal gastric body. There are loops of mildly distended gas-filled small bowel in the left mid abdomen. IMPRESSION: Reasonable positioning of the orogastric tube. Electronically Signed   By: David  Martinique M.D.   On: 01/13/2017 09:13   Dg Chest Portable 1 View  Result Date: 01/13/2017 CLINICAL DATA:  Intubation. EXAM: PORTABLE CHEST 1 VIEW COMPARISON:  01/12/2017. FINDINGS: Endotracheal tube tip 1.9 cm above the lower portion of the carina. NG tube tip below left hemidiaphragm. Left PICC line in stable position. Cardiomegaly with pulmonary vascular congestion again noted without interim change. Low lung volumes with mild basilar atelectasis. No pneumothorax. IMPRESSION: 1. Endotracheal tube tip 1.9 cm above the lower portion of the carina. NG tube tip below left hemidiaphragm. Left PICC line stable position. 2. Cardiomegaly with pulmonary vascular congestion again noted. No interim change. 3. Low lung volumes with basilar atelectasis. Electronically Signed   By: Marcello Moores  Register   On: 01/13/2017 06:41   Dg Chest Port 1 View  Result Date: 01/12/2017 CLINICAL DATA:  Altered mentation and wheeze EXAM: PORTABLE CHEST 1 VIEW COMPARISON:  12/22/2016 FINDINGS: Borderline cardiomegaly with aortic atherosclerosis. Slight interval increase in pulmonary vascular congestion consistent mild CHF. No pneumonic consolidation, effusion or pneumothorax. Minimal atelectasis and/or scarring at the right lung base. No acute nor suspicious osseous lesions. IMPRESSION:  Interval increase in pulmonary vascular congestion consistent with mild CHF. Stable aortic atherosclerosis Electronically  Signed   By: Ashley Royalty M.D.   On: 01/12/2017 17:57   Dg Chest Port 1v Same Day  Result Date: 01/12/2017 CLINICAL DATA:  PICC line placement EXAM: PORTABLE CHEST 1 VIEW COMPARISON:  01/12/2017 FINDINGS: Left upper extremity catheter tip directed cephalad. Stable borderline cardiomegaly. Mild central vascular calcification. No focal consolidation or effusion. Aortic atherosclerosis. IMPRESSION: 1. Left upper extremity catheter tip directed cephalad, possibly within the azygos system. Repositioning suggested 2. Cardiomegaly with slight worsens in central vascular congestion. Electronically Signed   By: Donavan Foil M.D.   On: 01/12/2017 20:22    Scheduled Meds: . chlorhexidine gluconate (MEDLINE KIT)  15 mL Mouth Rinse BID  . Chlorhexidine Gluconate Cloth  6 each Topical Daily  . epoetin (EPOGEN/PROCRIT) injection  10,000 Units Subcutaneous Q M,W,F-HD  . fentaNYL (SUBLIMAZE) injection  50 mcg Intravenous Once  . lactulose  30 g Per Tube Q6H  . mouth rinse  15 mL Mouth Rinse QID  . pantoprazole (PROTONIX) IV  40 mg Intravenous BID AC  . sodium chloride flush  10-40 mL Intracatheter Q12H  . sodium chloride flush  3 mL Intravenous Q12H   Continuous Infusions: . sodium chloride Stopped (01/13/17 0948)  . sodium chloride    . sodium chloride    . fentaNYL infusion INTRAVENOUS 400 mcg/hr (01/13/17 2159)  . piperacillin-tazobactam (ZOSYN)  IV Stopped (01/14/17 0100)  . [START ON 01/15/2017] vancomycin       LOS: 2 days   Shanna Strength, MD [facility].   If 7PM-7AM, please contact night-coverage [email or web address] Password [email or web address] 01/14/2017, 7:58 AM

## 2017-01-14 NOTE — Care Management (Signed)
Patient is well known by this CM from previous admissions. CM following for needs. Consulted for [facility] needs. Patient historically refuses all resources. CM will address with patient again when medically stable.

## 2017-01-14 NOTE — Progress Notes (Signed)
Subjective: She remains intubated and on the ventilator. Her oxygenation is better. She had been receiving lactulose enemas but now her rectum is significantly inflamed and swollen so I have taken the liberty of switching this to being given down her orogastric tube. She had dialysis yesterday. I believe plans are for her to have dialysis again today. Nursing staff showed me that her legs have significant sloughing of the skin so I went ahead and ask for wound ostomy consultation.  Objective: Vital signs in last 24 hours: Temp:  [95 F (35 C)-100 F (37.8 C)] 99.3 F (37.4 C) (06/07 0700) Pulse Rate:  [107-126] 117 (06/07 0700) Resp:  [11-28] 14 (06/07 0700) BP: (86-152)/(38-73) 148/69 (06/07 0700) SpO2:  [97 %-100 %] 99 % (06/07 0700) FiO2 (%):  [35 %-40 %] 35 % (06/07 0256) Weight:  [72.5 kg (159 lb 13.3 oz)] 72.5 kg (159 lb 13.3 oz) (06/06 1210) Weight change: 1.285 kg (2 lb 13.3 oz) Last BM Date: 01/13/17  Intake/Output from previous day: 06/06 0701 - 06/07 0700 In: 1515.6 [I.V.:677.6; Blood:738; IV Piggyback:100] Out: 2150 [Emesis/NG output:800]  PHYSICAL EXAM General appearance: Chronically ill. Intubated sedated on the ventilator she will attempt to open her eyes Resp: rhonchi bilaterally Cardio: regular rate and rhythm, S1, S2 normal, no murmur, click, rub or gallop GI: I don't feel her liver Extremities: She has significant abnormalities of the skin I think more of the right leg Her throat is clear she still has some blood in the back of her throat  Lab Results:  Results for orders placed or performed during the hospital encounter of 01/12/17 (from the past 48 hour(s))  Rapid urine drug screen (hospital performed)     Status: Abnormal   Collection Time: 01/12/17  4:50 PM  Result Value Ref Range   Opiates NONE DETECTED NONE DETECTED   Cocaine NONE DETECTED NONE DETECTED   Benzodiazepines POSITIVE (A) NONE DETECTED   Amphetamines NONE DETECTED NONE DETECTED    Tetrahydrocannabinol NONE DETECTED NONE DETECTED   Barbiturates NONE DETECTED NONE DETECTED    Comment:        DRUG SCREEN FOR MEDICAL PURPOSES ONLY.  IF CONFIRMATION IS NEEDED FOR ANY PURPOSE, NOTIFY LAB WITHIN 5 DAYS.        LOWEST DETECTABLE LIMITS FOR URINE DRUG SCREEN Drug Class       Cutoff (ng/mL) Amphetamine      1000 Barbiturate      200 Benzodiazepine   751 Tricyclics       025 Opiates          300 Cocaine          300 THC              50   Urinalysis, Routine w reflex microscopic     Status: Abnormal   Collection Time: 01/12/17  4:50 PM  Result Value Ref Range   Color, Urine YELLOW YELLOW   APPearance CLEAR CLEAR   Specific Gravity, Urine 1.013 1.005 - 1.030   pH 6.0 5.0 - 8.0   Glucose, UA NEGATIVE NEGATIVE mg/dL   Hgb urine dipstick SMALL (A) NEGATIVE   Bilirubin Urine NEGATIVE NEGATIVE   Ketones, ur NEGATIVE NEGATIVE mg/dL   Protein, ur 30 (A) NEGATIVE mg/dL   Nitrite NEGATIVE NEGATIVE   Leukocytes, UA NEGATIVE NEGATIVE   RBC / HPF 0-5 0 - 5 RBC/hpf   WBC, UA 0-5 0 - 5 WBC/hpf   Bacteria, UA NONE SEEN NONE SEEN   Squamous Epithelial /  LPF 0-5 (A) NONE SEEN  I-stat chem 8, ed     Status: Abnormal   Collection Time: 01/12/17  5:23 PM  Result Value Ref Range   Sodium 135 135 - 145 mmol/L   Potassium 5.8 (H) 3.5 - 5.1 mmol/L   Chloride 104 101 - 111 mmol/L   BUN 133 (H) 6 - 20 mg/dL   Creatinine, Ser >18.00 (H) 0.44 - 1.00 mg/dL   Glucose, Bld 117 (H) 65 - 99 mg/dL   Calcium, Ion 0.86 (LL) 1.15 - 1.40 mmol/L   TCO2 17 0 - 100 mmol/L   Hemoglobin 7.8 (L) 12.0 - 15.0 g/dL   HCT 23.0 (L) 36.0 - 46.0 %  I-Stat CG4 Lactic Acid, ED     Status: Abnormal   Collection Time: 01/12/17  5:25 PM  Result Value Ref Range   Lactic Acid, Venous 2.40 (HH) 0.5 - 1.9 mmol/L  Wound or Superficial Culture     Status: None (Preliminary result)   Collection Time: 01/12/17  5:50 PM  Result Value Ref Range   Specimen Description LEG RIGHT    Special Requests  Immunocompromised    Gram Stain      MODERATE WBC PRESENT, PREDOMINANTLY PMN ABUNDANT GRAM NEGATIVE RODS    Culture      ABUNDANT GRAM NEGATIVE RODS CULTURE REINCUBATED FOR BETTER GROWTH Performed at [facility], [address], [place], [place] [address]    Report Status PENDING   CBC with Differential/Platelet     Status: Abnormal   Collection Time: 01/12/17  6:24 PM  Result Value Ref Range   WBC 6.6 4.0 - 10.5 K/uL   RBC 2.71 (L) 3.87 - 5.11 MIL/uL   Hemoglobin 8.1 (L) 12.0 - 15.0 g/dL   HCT 24.7 (L) 36.0 - 46.0 %   MCV 91.1 78.0 - 100.0 fL   MCH 29.9 26.0 - 34.0 pg   MCHC 32.8 30.0 - 36.0 g/dL   RDW 17.7 (H) 11.5 - 15.5 %   Platelets 82 (L) 150 - 400 K/uL    Comment: SPECIMEN CHECKED FOR CLOTS PLATELET COUNT CONFIRMED BY SMEAR    Neutrophils Relative % 83 %   Neutro Abs 5.5 1.7 - 7.7 K/uL   Lymphocytes Relative 10 %   Lymphs Abs 0.7 0.7 - 4.0 K/uL   Monocytes Relative 5 %   Monocytes Absolute 0.3 0.1 - 1.0 K/uL   Eosinophils Relative 1 %   Eosinophils Absolute 0.1 0.0 - 0.7 K/uL   Basophils Relative 1 %   Basophils Absolute 0.0 0.0 - 0.1 K/uL  Comprehensive metabolic panel     Status: Abnormal   Collection Time: 01/12/17  6:24 PM  Result Value Ref Range   Sodium 140 135 - 145 mmol/L   Potassium 5.7 (H) 3.5 - 5.1 mmol/L   Chloride 101 101 - 111 mmol/L   CO2 14 (L) 22 - 32 mmol/L   Glucose, Bld 121 (H) 65 - 99 mg/dL   BUN 120 (H) 6 - 20 mg/dL    Comment: RESULTS CONFIRMED BY MANUAL DILUTION   Creatinine, Ser 20.34 (H) 0.44 - 1.00 mg/dL   Calcium 7.9 (L) 8.9 - 10.3 mg/dL   Total Protein 6.3 (L) 6.5 - 8.1 g/dL   Albumin 2.8 (L) 3.5 - 5.0 g/dL   AST 37 15 - 41 U/L   ALT 15 14 - 54 U/L   Alkaline Phosphatase 93 38 - 126 U/L   Total Bilirubin 0.8 0.3 - 1.2 mg/dL  GFR calc non Af Amer 2 (L) >60 mL/min   GFR calc Af Amer 2 (L) >60 mL/min    Comment: (NOTE) The eGFR has been calculated using the CKD EPI equation. This calculation has not been validated in  all clinical situations. eGFR's persistently <60 mL/min signify possible Chronic Kidney Disease.    Anion gap 25 (H) 5 - 15  Brain natriuretic peptide     Status: Abnormal   Collection Time: 01/12/17  6:24 PM  Result Value Ref Range   B Natriuretic Peptide 535.0 (H) 0.0 - 100.0 pg/mL  Protime-INR     Status: None   Collection Time: 01/12/17  6:24 PM  Result Value Ref Range   Prothrombin Time 15.2 11.4 - 15.2 seconds   INR 1.19   Troponin I     Status: None   Collection Time: 01/12/17  6:24 PM  Result Value Ref Range   Troponin I <0.03 <0.03 ng/mL  Lipase, blood     Status: Abnormal   Collection Time: 01/12/17  6:24 PM  Result Value Ref Range   Lipase 64 (H) 11 - 51 U/L  Culture, blood (Routine X 2) w Reflex to ID Panel     Status: None (Preliminary result)   Collection Time: 01/12/17  6:24 PM  Result Value Ref Range   Specimen Description BLOOD LEFT HAND    Special Requests Blood Culture adequate volume    Culture  Setup Time      GRAM POSITIVE COCCI IN CLUSTERS Gram Stain Report Called to,Read Back By and Verified With: MOTLEY J. AT 1320 BY THOMPSON S. ON [id number] CRITICAL RESULT CALLED TO, READ BACK BY AND VERIFIED WITH: J. MOTLEY, RN (APH) AT 1808 ON 01/13/17 BY C. JESSUP, MLT.    Culture      GRAM POSITIVE COCCI IDENTIFICATION AND SUSCEPTIBILITIES TO FOLLOW Performed at [facility], [place] [address], [place], [place] [address]    Report Status PENDING   Ammonia     Status: Abnormal   Collection Time: 01/12/17  6:24 PM  Result Value Ref Range   Ammonia 311 (H) 9 - 35 umol/L  Acetaminophen level     Status: Abnormal   Collection Time: 01/12/17  6:24 PM  Result Value Ref Range   Acetaminophen (Tylenol), Serum <10 (L) 10 - 30 ug/mL    Comment:        THERAPEUTIC CONCENTRATIONS VARY SIGNIFICANTLY. A RANGE OF 10-30 ug/mL MAY BE AN EFFECTIVE CONCENTRATION FOR MANY PATIENTS. HOWEVER, SOME ARE BEST TREATED AT CONCENTRATIONS OUTSIDE THIS RANGE. ACETAMINOPHEN  CONCENTRATIONS >150 ug/mL AT 4 HOURS AFTER INGESTION AND >50 ug/mL AT 12 HOURS AFTER INGESTION ARE OFTEN ASSOCIATED WITH TOXIC REACTIONS.   Salicylate level     Status: None   Collection Time: 01/12/17  6:24 PM  Result Value Ref Range   Salicylate Lvl <9.8 2.8 - 30.0 mg/dL  Ethanol     Status: None   Collection Time: 01/12/17  6:24 PM  Result Value Ref Range   Alcohol, Ethyl (B) <5 <5 mg/dL    Comment:        LOWEST DETECTABLE LIMIT FOR SERUM ALCOHOL IS 5 mg/dL FOR MEDICAL PURPOSES ONLY   APTT     Status: None   Collection Time: 01/12/17  6:24 PM  Result Value Ref Range   aPTT 34 24 - 36 seconds  Blood Culture ID Panel (Reflexed)     Status: Abnormal   Collection Time: 01/12/17  6:24 PM  Result Value Ref  Range   Enterococcus species NOT DETECTED NOT DETECTED   Listeria monocytogenes NOT DETECTED NOT DETECTED   Staphylococcus species DETECTED (A) NOT DETECTED    Comment: Methicillin (oxacillin) resistant coagulase negative staphylococcus. Possible blood culture contaminant (unless isolated from more than one blood culture draw or clinical case suggests pathogenicity). No antibiotic treatment is indicated for blood  culture contaminants. CRITICAL RESULT CALLED TO, READ BACK BY AND VERIFIED WITH: J. MOTLEY, RN (APH) ON AT 1808 ON 01/13/17 BY C. JESSUP, MLT.    Staphylococcus aureus NOT DETECTED NOT DETECTED   Methicillin resistance DETECTED (A) NOT DETECTED    Comment: CRITICAL RESULT CALLED TO, READ BACK BY AND VERIFIED WITH: J. MOTLEY, RN (APH) AT 1808 ON 01/13/17 BY C. JESSUP, MLT.    Streptococcus species NOT DETECTED NOT DETECTED   Streptococcus agalactiae NOT DETECTED NOT DETECTED   Streptococcus pneumoniae NOT DETECTED NOT DETECTED   Streptococcus pyogenes NOT DETECTED NOT DETECTED   Acinetobacter baumannii NOT DETECTED NOT DETECTED   Enterobacteriaceae species NOT DETECTED NOT DETECTED   Enterobacter cloacae complex NOT DETECTED NOT DETECTED   Escherichia coli NOT  DETECTED NOT DETECTED   Klebsiella oxytoca NOT DETECTED NOT DETECTED   Klebsiella pneumoniae NOT DETECTED NOT DETECTED   Proteus species NOT DETECTED NOT DETECTED   Serratia marcescens NOT DETECTED NOT DETECTED   Haemophilus influenzae NOT DETECTED NOT DETECTED   Neisseria meningitidis NOT DETECTED NOT DETECTED   Pseudomonas aeruginosa NOT DETECTED NOT DETECTED   Candida albicans NOT DETECTED NOT DETECTED   Candida glabrata NOT DETECTED NOT DETECTED   Candida krusei NOT DETECTED NOT DETECTED   Candida parapsilosis NOT DETECTED NOT DETECTED   Candida tropicalis NOT DETECTED NOT DETECTED    Comment: Performed at [facility] [address] [place], [place] [address]  I-Stat Beta hCG blood, ED ([facility], [facility], [facility] only)     Status: None   Collection Time: 01/12/17  7:19 PM  Result Value Ref Range   I-stat hCG, quantitative <5.0 <5 mIU/mL   Comment 3            Comment:   GEST. AGE      CONC.  (mIU/mL)   <=1 WEEK        5 - 50     2 WEEKS       50 - 500     3 WEEKS       100 - 10,000     4 WEEKS     1,000 - 30,000        FEMALE AND NON-PREGNANT FEMALE:     LESS THAN 5 mIU/mL   Lactic acid, plasma     Status: None   Collection Time: 01/12/17  9:26 PM  Result Value Ref Range   Lactic Acid, Venous 1.9 0.5 - 1.9 mmol/L  Type and screen [facility]     Status: None (Preliminary result)   Collection Time: 01/12/17  9:30 PM  Result Value Ref Range   ABO/RH(D) A POS    Antibody Screen NEG    Sample Expiration 01/15/2017    Unit Number [id number]    Blood Component Type RED CELLS,LR    Unit division 00    Status of Unit ALLOCATED    Transfusion Status OK TO TRANSFUSE    Crossmatch Result Compatible    Unit Number [id number]    Blood Component Type RED CELLS,LR    Unit division 00    Status of Unit ALLOCATED  Transfusion Status OK TO TRANSFUSE    Crossmatch Result Compatible   Prepare RBC (crossmatch)     Status: None   Collection Time: 01/12/17  9:30 PM  Result  Value Ref Range   Order Confirmation ORDER PROCESSED BY BLOOD BANK   Prepare Pheresed Platelets     Status: None   Collection Time: 01/12/17  9:30 PM  Result Value Ref Range   Unit Number [id number]    Blood Component Type PLTP LR1 PAS    Unit division 00    Status of Unit ISSUED,FINAL    Transfusion Status OK TO TRANSFUSE   MRSA PCR Screening     Status: None   Collection Time: 01/13/17 12:09 AM  Result Value Ref Range   MRSA by PCR NEGATIVE NEGATIVE    Comment:        The GeneXpert MRSA Assay (FDA approved for NASAL specimens only), is one component of a comprehensive MRSA colonization surveillance program. It is not intended to diagnose MRSA infection nor to guide or monitor treatment for MRSA infections.   Culture, blood (Routine X 2) w Reflex to ID Panel     Status: None (Preliminary result)   Collection Time: 01/13/17  4:47 AM  Result Value Ref Range   Specimen Description BLOOD PICC LINE    Special Requests      BOTTLES DRAWN AEROBIC AND ANAEROBIC Blood Culture results may not be optimal due to an excessive volume of blood received in culture bottles AEROBIC BOTTLE Blood Culture results may not be optimal due to an inadequate volume of blood received in culture  bottles ANAEROBIC BOTTLE    Culture NO GROWTH 1 DAY    Report Status PENDING   CBC     Status: Abnormal   Collection Time: 01/13/17  4:47 AM  Result Value Ref Range   WBC 10.5 4.0 - 10.5 K/uL   RBC 2.74 (L) 3.87 - 5.11 MIL/uL   Hemoglobin 8.3 (L) 12.0 - 15.0 g/dL   HCT 24.8 (L) 36.0 - 46.0 %   MCV 90.5 78.0 - 100.0 fL   MCH 30.3 26.0 - 34.0 pg   MCHC 33.5 30.0 - 36.0 g/dL   RDW 17.6 (H) 11.5 - 15.5 %   Platelets 110 (L) 150 - 400 K/uL    Comment: SPECIMEN CHECKED FOR CLOTS  Comprehensive metabolic panel     Status: Abnormal   Collection Time: 01/13/17  4:47 AM  Result Value Ref Range   Sodium 140 135 - 145 mmol/L   Potassium 5.8 (H) 3.5 - 5.1 mmol/L   Chloride 101 101 - 111 mmol/L   CO2 14 (L)  22 - 32 mmol/L   Glucose, Bld 99 65 - 99 mg/dL   BUN 132 (H) 6 - 20 mg/dL    Comment: RESULTS CONFIRMED BY MANUAL DILUTION   Creatinine, Ser 20.48 (H) 0.44 - 1.00 mg/dL   Calcium 8.0 (L) 8.9 - 10.3 mg/dL   Total Protein 6.8 6.5 - 8.1 g/dL   Albumin 3.0 (L) 3.5 - 5.0 g/dL   AST 36 15 - 41 U/L   ALT 17 14 - 54 U/L   Alkaline Phosphatase 109 38 - 126 U/L   Total Bilirubin 1.0 0.3 - 1.2 mg/dL   GFR calc non Af Amer 2 (L) >60 mL/min   GFR calc Af Amer 2 (L) >60 mL/min    Comment: (NOTE) The eGFR has been calculated using the CKD EPI equation. This calculation has not been validated in all clinical  situations. eGFR's persistently <60 mL/min signify possible Chronic Kidney Disease.    Anion gap 25 (H) 5 - 15  Ammonia     Status: Abnormal   Collection Time: 01/13/17  4:47 AM  Result Value Ref Range   Ammonia 205 (H) 9 - 35 umol/L  Glucose, capillary     Status: Abnormal   Collection Time: 01/13/17  8:06 AM  Result Value Ref Range   Glucose-Capillary 125 (H) 65 - 99 mg/dL  Protime-INR     Status: Abnormal   Collection Time: 01/13/17  8:25 AM  Result Value Ref Range   Prothrombin Time 15.7 (H) 11.4 - 15.2 seconds   INR 1.24   Blood gas, arterial     Status: Abnormal   Collection Time: 01/13/17  8:45 AM  Result Value Ref Range   FIO2 100.00    Delivery systems VENTILATOR    Mode PRESSURE REGULATED VOLUME CONTROL    VT 500 mL   LHR 14 resp/min   Peep/cpap 5.0 cm H20   pH, Arterial 7.267 (L) 7.350 - 7.450   pCO2 arterial 31.4 (L) 32.0 - 48.0 mmHg   pO2, Arterial 295 (H) 83.0 - 108.0 mmHg   Bicarbonate 15.3 (L) 20.0 - 28.0 mmol/L   Acid-base deficit 11.7 (H) 0.0 - 2.0 mmol/L   O2 Saturation 98.8 %   Patient temperature 35.9    Collection site LEFT RADIAL    Drawn by [id number]    Sample type ARTERIAL DRAW    Allens test (pass/fail) PASS PASS  Glucose, capillary     Status: Abnormal   Collection Time: 01/13/17 11:32 AM  Result Value Ref Range   Glucose-Capillary 138 (H) 65 - 99  mg/dL  CBC with Differential/Platelet     Status: Abnormal   Collection Time: 01/13/17  4:23 PM  Result Value Ref Range   WBC 14.9 (H) 4.0 - 10.5 K/uL   RBC 2.83 (L) 3.87 - 5.11 MIL/uL   Hemoglobin 8.4 (L) 12.0 - 15.0 g/dL   HCT 25.4 (L) 36.0 - 46.0 %   MCV 89.8 78.0 - 100.0 fL   MCH 29.7 26.0 - 34.0 pg   MCHC 33.1 30.0 - 36.0 g/dL   RDW 17.8 (H) 11.5 - 15.5 %   Platelets 236 150 - 400 K/uL    Comment: DELTA CHECK NOTED   Neutrophils Relative % 87 %   Neutro Abs 12.9 (H) 1.7 - 7.7 K/uL   Lymphocytes Relative 8 %   Lymphs Abs 1.2 0.7 - 4.0 K/uL   Monocytes Relative 4 %   Monocytes Absolute 0.6 0.1 - 1.0 K/uL   Eosinophils Relative 1 %   Eosinophils Absolute 0.2 0.0 - 0.7 K/uL   Basophils Relative 0 %   Basophils Absolute 0.0 0.0 - 0.1 K/uL  Glucose, capillary     Status: None   Collection Time: 01/13/17  4:30 PM  Result Value Ref Range   Glucose-Capillary 98 65 - 99 mg/dL  Glucose, capillary     Status: Abnormal   Collection Time: 01/13/17  8:09 PM  Result Value Ref Range   Glucose-Capillary 108 (H) 65 - 99 mg/dL  Glucose, capillary     Status: Abnormal   Collection Time: 01/14/17 12:13 AM  Result Value Ref Range   Glucose-Capillary 121 (H) 65 - 99 mg/dL  Glucose, capillary     Status: Abnormal   Collection Time: 01/14/17  4:45 AM  Result Value Ref Range   Glucose-Capillary 115 (H) 65 - 99 mg/dL  Glucose, capillary     Status: Abnormal   Collection Time: 01/14/17  7:41 AM  Result Value Ref Range   Glucose-Capillary 115 (H) 65 - 99 mg/dL  CBC with Differential/Platelet     Status: Abnormal   Collection Time: 01/14/17  8:11 AM  Result Value Ref Range   WBC 9.4 4.0 - 10.5 K/uL   RBC 2.09 (L) 3.87 - 5.11 MIL/uL   Hemoglobin 6.2 (LL) 12.0 - 15.0 g/dL    Comment: REPEATED TO VERIFY CRITICAL RESULT CALLED TO, READ BACK BY AND VERIFIED WITH: PHILLIPS,C RN AT 830AM BY HFLYNT 01/14/17    HCT 19.5 (L) 36.0 - 46.0 %   MCV 93.3 78.0 - 100.0 fL   MCH 29.7 26.0 - 34.0 pg    MCHC 31.8 30.0 - 36.0 g/dL   RDW 18.3 (H) 11.5 - 15.5 %   Platelets 110 (L) 150 - 400 K/uL    Comment: PLATELET COUNT CONFIRMED BY SMEAR SPECIMEN CHECKED FOR CLOTS DELTA CHECK NOTED    Neutrophils Relative % 77 %   Neutro Abs 7.3 1.7 - 7.7 K/uL   Lymphocytes Relative 13 %   Lymphs Abs 1.2 0.7 - 4.0 K/uL   Monocytes Relative 9 %   Monocytes Absolute 0.8 0.1 - 1.0 K/uL   Eosinophils Relative 1 %   Eosinophils Absolute 0.1 0.0 - 0.7 K/uL   Basophils Relative 0 %   Basophils Absolute 0.0 0.0 - 0.1 K/uL  Comprehensive metabolic panel     Status: Abnormal   Collection Time: 01/14/17  8:11 AM  Result Value Ref Range   Sodium 145 135 - 145 mmol/L   Potassium 3.9 3.5 - 5.1 mmol/L    Comment: DELTA CHECK NOTED   Chloride 103 101 - 111 mmol/L   CO2 23 22 - 32 mmol/L   Glucose, Bld 125 (H) 65 - 99 mg/dL   BUN 69 (H) 6 - 20 mg/dL   Creatinine, Ser 12.10 (H) 0.44 - 1.00 mg/dL    Comment: DELTA CHECK NOTED   Calcium 9.0 8.9 - 10.3 mg/dL   Total Protein 6.1 (L) 6.5 - 8.1 g/dL   Albumin 2.7 (L) 3.5 - 5.0 g/dL   AST 38 15 - 41 U/L   ALT 18 14 - 54 U/L   Alkaline Phosphatase 88 38 - 126 U/L   Total Bilirubin 1.4 (H) 0.3 - 1.2 mg/dL   GFR calc non Af Amer 3 (L) >60 mL/min   GFR calc Af Amer 4 (L) >60 mL/min    Comment: (NOTE) The eGFR has been calculated using the CKD EPI equation. This calculation has not been validated in all clinical situations. eGFR's persistently <60 mL/min signify possible Chronic Kidney Disease.    Anion gap 19 (H) 5 - 15  Ammonia     Status: None   Collection Time: 01/14/17  8:11 AM  Result Value Ref Range   Ammonia 27 9 - 35 umol/L    ABGS  Recent Labs  01/12/17 1723 01/13/17 0845  PHART  --  7.267*  PO2ART  --  295*  TCO2 17  --   HCO3  --  15.3*   CULTURES Recent Results (from the past 240 hour(s))  Wound or Superficial Culture     Status: None (Preliminary result)   Collection Time: 01/12/17  5:50 PM  Result Value Ref Range Status   Specimen  Description LEG RIGHT  Final   Special Requests Immunocompromised  Final   Gram Stain   Final  MODERATE WBC PRESENT, PREDOMINANTLY PMN ABUNDANT GRAM NEGATIVE RODS    Culture   Final    ABUNDANT GRAM NEGATIVE RODS CULTURE REINCUBATED FOR BETTER GROWTH Performed at [facility], [place] [address] [place], [place] [address]    Report Status PENDING  Incomplete  Culture, blood (Routine X 2) w Reflex to ID Panel     Status: None (Preliminary result)   Collection Time: 01/12/17  6:24 PM  Result Value Ref Range Status   Specimen Description BLOOD LEFT HAND  Final   Special Requests Blood Culture adequate volume  Final   Culture  Setup Time   Final    GRAM POSITIVE COCCI IN CLUSTERS Gram Stain Report Called to,Read Back By and Verified With: MOTLEY J. AT 1320 BY THOMPSON S. ON [id number] CRITICAL RESULT CALLED TO, READ BACK BY AND VERIFIED WITH: J. MOTLEY, RN (APH) AT 1808 ON 01/13/17 BY C. JESSUP, MLT.    Culture   Final    GRAM POSITIVE COCCI IDENTIFICATION AND SUSCEPTIBILITIES TO FOLLOW Performed at [facility], [place] [address] [place], [place] [address]    Report Status PENDING  Incomplete  Blood Culture ID Panel (Reflexed)     Status: Abnormal   Collection Time: 01/12/17  6:24 PM  Result Value Ref Range Status   Enterococcus species NOT DETECTED NOT DETECTED Final   Listeria monocytogenes NOT DETECTED NOT DETECTED Final   Staphylococcus species DETECTED (A) NOT DETECTED Final    Comment: Methicillin (oxacillin) resistant coagulase negative staphylococcus. Possible blood culture contaminant (unless isolated from more than one blood culture draw or clinical case suggests pathogenicity). No antibiotic treatment is indicated for blood  culture contaminants. CRITICAL RESULT CALLED TO, READ BACK BY AND VERIFIED WITH: J. MOTLEY, RN (APH) ON AT 1808 ON 01/13/17 BY C. JESSUP, MLT.    Staphylococcus aureus NOT DETECTED NOT DETECTED Final   Methicillin resistance DETECTED (A) NOT  DETECTED Final    Comment: CRITICAL RESULT CALLED TO, READ BACK BY AND VERIFIED WITH: J. MOTLEY, RN (APH) AT 1808 ON 01/13/17 BY C. JESSUP, MLT.    Streptococcus species NOT DETECTED NOT DETECTED Final   Streptococcus agalactiae NOT DETECTED NOT DETECTED Final   Streptococcus pneumoniae NOT DETECTED NOT DETECTED Final   Streptococcus pyogenes NOT DETECTED NOT DETECTED Final   Acinetobacter baumannii NOT DETECTED NOT DETECTED Final   Enterobacteriaceae species NOT DETECTED NOT DETECTED Final   Enterobacter cloacae complex NOT DETECTED NOT DETECTED Final   Escherichia coli NOT DETECTED NOT DETECTED Final   Klebsiella oxytoca NOT DETECTED NOT DETECTED Final   Klebsiella pneumoniae NOT DETECTED NOT DETECTED Final   Proteus species NOT DETECTED NOT DETECTED Final   Serratia marcescens NOT DETECTED NOT DETECTED Final   Haemophilus influenzae NOT DETECTED NOT DETECTED Final   Neisseria meningitidis NOT DETECTED NOT DETECTED Final   Pseudomonas aeruginosa NOT DETECTED NOT DETECTED Final   Candida albicans NOT DETECTED NOT DETECTED Final   Candida glabrata NOT DETECTED NOT DETECTED Final   Candida krusei NOT DETECTED NOT DETECTED Final   Candida parapsilosis NOT DETECTED NOT DETECTED Final   Candida tropicalis NOT DETECTED NOT DETECTED Final    Comment: Performed at [facility], [place] [address] [place], [place] [address]  MRSA PCR Screening     Status: None   Collection Time: 01/13/17 12:09 AM  Result Value Ref Range Status   MRSA by PCR NEGATIVE NEGATIVE Final    Comment:        The  GeneXpert MRSA Assay (FDA approved for NASAL specimens only), is one component of a comprehensive MRSA colonization surveillance program. It is not intended to diagnose MRSA infection nor to guide or monitor treatment for MRSA infections.   Culture, blood (Routine X 2) w Reflex to ID Panel     Status: None (Preliminary result)   Collection Time: 01/13/17  4:47 AM  Result Value Ref Range Status    Specimen Description BLOOD PICC LINE  Final   Special Requests   Final    BOTTLES DRAWN AEROBIC AND ANAEROBIC Blood Culture results may not be optimal due to an excessive volume of blood received in culture bottles AEROBIC BOTTLE Blood Culture results may not be optimal due to an inadequate volume of blood received in culture  bottles ANAEROBIC BOTTLE    Culture NO GROWTH 1 DAY  Final   Report Status PENDING  Incomplete   Studies/Results: Ct Head Wo Contrast  Result Date: 01/13/2017 CLINICAL DATA:  Altered mental status.  Ventilator dependence. EXAM: CT HEAD WITHOUT CONTRAST TECHNIQUE: Contiguous axial images were obtained from the base of the skull through the vertex without intravenous contrast. COMPARISON:  09/29/2016 FINDINGS: Brain: There is no evidence for acute hemorrhage, hydrocephalus, mass lesion, or abnormal extra-axial fluid collection. No definite CT evidence for acute infarction. Vascular: No hyperdense vessel or unexpected calcification. Skull: No evidence for fracture. No worrisome lytic or sclerotic lesion. Sinuses/Orbits: Mucosal disease in the ethmoid air cells and maxillary sinus has progressed in the interval. Visualized portions of the globes and intraorbital fat are unremarkable. Other: None. IMPRESSION: 1. No acute intracranial abnormality. 2. Interval development of paranasal sinus disease, nonspecific in the setting of intubation. Electronically Signed   By: Misty Stanley M.D.   On: 01/13/2017 11:33   Dg Chest 1v Repeat Same Day  Result Date: 01/12/2017 CLINICAL DATA:  PICC line placement EXAM: CHEST - 1 VIEW SAME DAY COMPARISON:  1959 hours on the same day FINDINGS: Left-sided PICC line tip projects over the expected location of the proximal right atrium. Pullback approximately 2 cm. Stable cardiomegaly with aortic atherosclerosis and mild CHF. Low lung volumes are noted. No acute nor suspicious osseous lesions. IMPRESSION: 1. Tip of left-sided PICC line tip projects over the  proximal right atrium. Pullback approximately 2 cm. 2. Cardiomegaly with aortic atherosclerosis and mild CHF. Electronically Signed   By: Ashley Royalty M.D.   On: 01/12/2017 22:44   Dg Chest Port 1 View  Result Date: 01/14/2017 CLINICAL DATA:  Respiratory failure EXAM: PORTABLE CHEST 1 VIEW COMPARISON:  01/13/2017 FINDINGS: ET tube tip is above the carina. There is a left arm PICC line with tip in the cavoatrial junction. OG tube tip is in the stomach. Aortic atherosclerosis. Normal heart size. Decrease in left pleural effusion and pulmonary vascular congestion. IMPRESSION: 1. Decrease in left pleural effusion and pulmonary vascular congestion. Electronically Signed   By: Kerby Moors M.D.   On: 01/14/2017 09:01   Dg Chest Port 1 View  Result Date: 01/13/2017 CLINICAL DATA:  Status post orogastric tube placement. EXAM: PORTABLE CHEST 1 VIEW COMPARISON:  Chest x-ray of earlier today. FINDINGS: The orogastric tube tip projects at approximately the level of the pylorus or proximal duodenum. The proximal port projects in the distal gastric body. There are loops of mildly distended gas-filled small bowel in the left mid abdomen. IMPRESSION: Reasonable positioning of the orogastric tube. Electronically Signed   By: David  Martinique M.D.   On: 01/13/2017 09:13   Dg Chest  Portable 1 View  Result Date: 01/13/2017 CLINICAL DATA:  Intubation. EXAM: PORTABLE CHEST 1 VIEW COMPARISON:  01/12/2017. FINDINGS: Endotracheal tube tip 1.9 cm above the lower portion of the carina. NG tube tip below left hemidiaphragm. Left PICC line in stable position. Cardiomegaly with pulmonary vascular congestion again noted without interim change. Low lung volumes with mild basilar atelectasis. No pneumothorax. IMPRESSION: 1. Endotracheal tube tip 1.9 cm above the lower portion of the carina. NG tube tip below left hemidiaphragm. Left PICC line stable position. 2. Cardiomegaly with pulmonary vascular congestion again noted. No interim change.  3. Low lung volumes with basilar atelectasis. Electronically Signed   By: Marcello Moores  Register   On: 01/13/2017 06:41   Dg Chest Port 1 View  Result Date: 01/12/2017 CLINICAL DATA:  Altered mentation and wheeze EXAM: PORTABLE CHEST 1 VIEW COMPARISON:  12/22/2016 FINDINGS: Borderline cardiomegaly with aortic atherosclerosis. Slight interval increase in pulmonary vascular congestion consistent mild CHF. No pneumonic consolidation, effusion or pneumothorax. Minimal atelectasis and/or scarring at the right lung base. No acute nor suspicious osseous lesions. IMPRESSION: Interval increase in pulmonary vascular congestion consistent with mild CHF. Stable aortic atherosclerosis Electronically Signed   By: Ashley Royalty M.D.   On: 01/12/2017 17:57   Dg Chest Port 1v Same Day  Result Date: 01/12/2017 CLINICAL DATA:  PICC line placement EXAM: PORTABLE CHEST 1 VIEW COMPARISON:  01/12/2017 FINDINGS: Left upper extremity catheter tip directed cephalad. Stable borderline cardiomegaly. Mild central vascular calcification. No focal consolidation or effusion. Aortic atherosclerosis. IMPRESSION: 1. Left upper extremity catheter tip directed cephalad, possibly within the azygos system. Repositioning suggested 2. Cardiomegaly with slight worsens in central vascular congestion. Electronically Signed   By: Donavan Foil M.D.   On: 01/12/2017 20:22    Medications:  Prior to Admission:  Prescriptions Prior to Admission  Medication Sig Dispense Refill Last Dose  . augmented betamethasone dipropionate (DIPROLENE-AF) 0.05 % cream APPLY TO THE AFFECTED AREAS ON LEGS TWICE DAILY AS NEEDED (NOT FACE, GROIN, OR UNDER ARMS)  3 unknown  . clotrimazole (LOTRIMIN) 1 % cream Apply 1 application topically 3 (three) times daily. 12 g 1 unknown  . lidocaine (XYLOCAINE) 2 % solution TAKE TWO TEASPOONSFUL (10ML) BY MOUTH BEFORE MEALS AND AT BEDTIME TO PREVENT CHEST PAIN WHILE EATING 100 mL 1 unknown  . LORazepam (ATIVAN) 1 MG tablet Take 1 mg by  mouth 2 (two) times daily as needed for anxiety (and/or back spasms).   unknown  . midodrine (PROAMATINE) 5 MG tablet TAKE TWO (2) TABLETS BY MOUTH DAILY. TAKE ONLY ON DIALYSIS DAYS ON TUESDAYS, THURDAYS, AND SATURDAYS (SOMETIME TAKES TREATMENTS ON FRIDAY IN  3 unknown  . Oxycodone HCl 10 MG TABS Take 0.5 tablets (5 mg total) by mouth 3 (three) times daily as needed. 1 PO TID AS NEEDED FOR PAIN (Patient taking differently: Take 5-10 mg by mouth 3 (three) times daily as needed. ) 1 tablet 0 unknown  . Darbepoetin Alfa (ARANESP) 60 MCG/0.3ML SOSY injection Inject 0.3 mLs (60 mcg total) into the vein every Thursday with hemodialysis. (Patient taking differently: Inject 60 mcg into the vein every Tuesday with hemodialysis. ) 4.2 mL  12/22/2016 at Unknown time  . lactulose (CHRONULAC) 10 GM/15ML solution Take 15-30 mLs (10-20 g total) by mouth 3 (three) times daily. TAKE 15-30 ml BY MOUTH three times a day   12/22/2016 at Unknown time  . lidocaine-prilocaine (EMLA) cream Apply 1 application topically as needed (for dialysis treatments).   Past Month at Unknown time  .  midodrine (PROAMATINE) 10 MG tablet Take 1 tablet (10 mg total) by mouth 3 (three) times daily.   12/22/2016 at Unknown time  . omeprazole (PRILOSEC) 20 MG capsule 1 po every morning 30 minutes prior to your first meal. (Patient taking differently: Take 20 mg by mouth daily before breakfast. 1 po every morning 30 minutes prior to your first meal.) 31 capsule 11 12/21/2016 at Unknown time  . RENVELA 800 MG tablet Take 1600 mg by mouth 3 times daily with meals. Take 800 mg by mouth with snacks.  5 12/21/2016 at Unknown time  . sucralfate (CARAFATE) 1 GM/10ML suspension Take 1 g by mouth 4 (four) times daily -  with meals and at bedtime.   12/21/2016 at Unknown time   Scheduled: . chlorhexidine gluconate (MEDLINE KIT)  15 mL Mouth Rinse BID  . Chlorhexidine Gluconate Cloth  6 each Topical Daily  . epoetin (EPOGEN/PROCRIT) injection  10,000 Units  Subcutaneous Q M,W,F-HD  . fentaNYL (SUBLIMAZE) injection  50 mcg Intravenous Once  . lactulose  30 g Per Tube Q6H  . mouth rinse  15 mL Mouth Rinse QID  . pantoprazole (PROTONIX) IV  40 mg Intravenous BID AC  . sodium chloride flush  10-40 mL Intracatheter Q12H  . sodium chloride flush  3 mL Intravenous Q12H   Continuous: . sodium chloride Stopped (01/13/17 0948)  . sodium chloride    . sodium chloride    . fentaNYL infusion INTRAVENOUS 400 mcg/hr (01/13/17 2159)  . piperacillin-tazobactam (ZOSYN)  IV Stopped (01/14/17 0100)  . [START ON 01/15/2017] vancomycin     KLK:JZPHXT chloride, sodium chloride, alteplase, fentaNYL, heparin, lidocaine (PF), lidocaine-prilocaine, ondansetron **OR** ondansetron (ZOFRAN) IV, pentafluoroprop-tetrafluoroeth, sodium chloride flush  Assesment: She was intubated for airway protection. She had significant bleeding. She's been treated for aspiration pneumonia which I think is appropriate. She had thrombocytopenia which made her bleeding worse. She has cirrhosis of the liver and laboratory work is pending. She has end-stage renal disease on dialysis and laboratory work is pending. Significantly altered mental status which I think is improving somewhat. 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 Changes as noted. I don't think she's ready to come off the ventilator yet    LOS: 2 days   Aleia Larocca L 01/14/2017, 9:04 AM

## 2017-01-14 NOTE — Progress Notes (Signed)
Wanda [name]  MRN: [id number]  DOB/AGE: 1976-06-30 41 y.o.  Primary Care Physician:Roberson, [name], MD  Admit date: 01/12/2017  Chief Complaint:  Chief Complaint  Patient presents with  . Altered Mental Status    S-Pt presented on  01/12/2017 with  Chief Complaint  Patient presents with  . Altered Mental Status  .     Pt unable to offer any complaints   As intubated but opens eyes and nods head to questions.   Meds . chlorhexidine gluconate (MEDLINE KIT)  15 mL Mouth Rinse BID  . Chlorhexidine Gluconate Cloth  6 each Topical Daily  . epoetin (EPOGEN/PROCRIT) injection  10,000 Units Subcutaneous Q M,W,F-HD  . fentaNYL (SUBLIMAZE) injection  50 mcg Intravenous Once  . lactulose  30 g Per Tube Q6H  . mouth rinse  15 mL Mouth Rinse QID  . pantoprazole (PROTONIX) IV  40 mg Intravenous BID AC  . sodium chloride flush  10-40 mL Intracatheter Q12H  . sodium chloride flush  3 mL Intravenous Q12H     Physical Exam: Vital signs in last 24 hours: Temp:  [98.1 F (36.7 C)-100 F (37.8 C)] 98.1 F (36.7 C) (06/07 2315) Pulse Rate:  [105-129] 105 (06/07 2315) Resp:  [11-28] 15 (06/07 2315) BP: (110-166)/(38-91) 161/91 (06/07 2315) SpO2:  [94 %-100 %] 100 % (06/07 2315) FiO2 (%):  [30 %-35 %] 30 % (06/07 2307) Weight:  [154 lb 15.7 oz (70.3 kg)] 154 lb 15.7 oz (70.3 kg) (06/07 1510) Weight change: 2 lb 13.3 oz (1.285 kg) Last BM Date: 01/13/17  Intake/Output from previous day: 06/06 0701 - 06/07 0700 In: 1515.6 [I.V.:677.6; Blood:738; IV Piggyback:100] Out: 2150 [Emesis/NG output:800] Total I/O In: 218.8 [I.V.:168.8; IV Piggyback:50] Out: -    Physical Exam: General- pt is intubated HEENT- Epistaxis present, OG tube in situ, still some Blood in tube Resp- ET tube in situ, decreased at bases CVS- S1S2 regular in rate and rhythm GIT- BS+, soft, umbilical hernia present, distended EXT- 1+ LE Edema,NO Cyanosis. Chronic venous stasis changes present. Access-   AVF+  Lab Results: CBC  Recent Labs  01/13/17 1623 01/14/17 0811 01/14/17 1948  WBC 14.9* 9.4  --   HGB 8.4* 6.2* 8.1*  HCT 25.4* 19.5* 24.1*  PLT 236 110*  --     BMET  Recent Labs  01/13/17 0447 01/14/17 0811  NA 140 145  K 5.8* 3.9  CL 101 103  CO2 14* 23  GLUCOSE 99 125*  BUN 132* 69*  CREATININE 20.48* 12.10*  CALCIUM 8.0* 9.0    MICRO Recent Results (from the past 240 hour(s))  Wound or Superficial Culture     Status: None (Preliminary result)   Collection Time: 01/12/17  5:50 PM  Result Value Ref Range Status   Specimen Description LEG RIGHT  Final   Special Requests Immunocompromised  Final   Gram Stain   Final    MODERATE WBC PRESENT, PREDOMINANTLY PMN ABUNDANT GRAM NEGATIVE RODS    Culture   Final    ABUNDANT KLEBSIELLA OXYTOCA SUSCEPTIBILITIES TO FOLLOW Performed at [facility], [place] [address] [place], [place] [address]    Report Status PENDING  Incomplete  Culture, blood (Routine X 2) w Reflex to ID Panel     Status: Abnormal (Preliminary result)   Collection Time: 01/12/17  6:24 PM  Result Value Ref Range Status   Specimen Description BLOOD LEFT HAND  Final   Special Requests Blood Culture adequate volume  Final   Culture  Setup Time   Final    GRAM POSITIVE COCCI IN CLUSTERS Gram Stain Report Called to,Read Back By and Verified With: MOTLEY J. AT 1320 BY THOMPSON S. ON [id number] CRITICAL RESULT CALLED TO, READ BACK BY AND VERIFIED WITH: J. MOTLEY, RN ([facility]) AT 1808 ON 01/13/17 BY C. JESSUP, MLT.    Culture (A)  Final    STAPHYLOCOCCUS SPECIES (COAGULASE NEGATIVE) THE SIGNIFICANCE OF ISOLATING THIS ORGANISM FROM A SINGLE SET OF BLOOD CULTURES WHEN MULTIPLE SETS ARE DRAWN IS UNCERTAIN. PLEASE NOTIFY THE MICROBIOLOGY DEPARTMENT WITHIN ONE WEEK IF SPECIATION AND SENSITIVITIES ARE REQUIRED. Performed at [facility], [place] [address] [place], [place] [address]    Report Status PENDING  Incomplete  Blood Culture ID Panel (Reflexed)      Status: Abnormal   Collection Time: 01/12/17  6:24 PM  Result Value Ref Range Status   Enterococcus species NOT DETECTED NOT DETECTED Final   Listeria monocytogenes NOT DETECTED NOT DETECTED Final   Staphylococcus species DETECTED (A) NOT DETECTED Final    Comment: Methicillin (oxacillin) resistant coagulase negative staphylococcus. Possible blood culture contaminant (unless isolated from more than one blood culture draw or clinical case suggests pathogenicity). No antibiotic treatment is indicated for blood  culture contaminants. CRITICAL RESULT CALLED TO, READ BACK BY AND VERIFIED WITH: J. MOTLEY, RN (APH) ON AT 1808 ON 01/13/17 BY C. JESSUP, MLT.    Staphylococcus aureus NOT DETECTED NOT DETECTED Final   Methicillin resistance DETECTED (A) NOT DETECTED Final    Comment: CRITICAL RESULT CALLED TO, READ BACK BY AND VERIFIED WITH: J. MOTLEY, RN ([facility]) AT 1808 ON 01/13/17 BY C. JESSUP, MLT.    Streptococcus species NOT DETECTED NOT DETECTED Final   Streptococcus agalactiae NOT DETECTED NOT DETECTED Final   Streptococcus pneumoniae NOT DETECTED NOT DETECTED Final   Streptococcus pyogenes NOT DETECTED NOT DETECTED Final   Acinetobacter baumannii NOT DETECTED NOT DETECTED Final   Enterobacteriaceae species NOT DETECTED NOT DETECTED Final   Enterobacter cloacae complex NOT DETECTED NOT DETECTED Final   Escherichia coli NOT DETECTED NOT DETECTED Final   Klebsiella oxytoca NOT DETECTED NOT DETECTED Final   Klebsiella pneumoniae NOT DETECTED NOT DETECTED Final   Proteus species NOT DETECTED NOT DETECTED Final   Serratia marcescens NOT DETECTED NOT DETECTED Final   Haemophilus influenzae NOT DETECTED NOT DETECTED Final   Neisseria meningitidis NOT DETECTED NOT DETECTED Final   Pseudomonas aeruginosa NOT DETECTED NOT DETECTED Final   Candida albicans NOT DETECTED NOT DETECTED Final   Candida glabrata NOT DETECTED NOT DETECTED Final   Candida krusei NOT DETECTED NOT DETECTED Final   Candida  parapsilosis NOT DETECTED NOT DETECTED Final   Candida tropicalis NOT DETECTED NOT DETECTED Final    Comment: Performed at [facility], [place] [address], [place], [place] [address]  MRSA PCR Screening     Status: None   Collection Time: 01/13/17 12:09 AM  Result Value Ref Range Status   MRSA by PCR NEGATIVE NEGATIVE Final    Comment:        The GeneXpert MRSA Assay (FDA approved for NASAL specimens only), is one component of a comprehensive MRSA colonization surveillance program. It is not intended to diagnose MRSA infection nor to guide or monitor treatment for MRSA infections.   Culture, blood (Routine X 2) w Reflex to ID Panel     Status: None (Preliminary result)   Collection Time: 01/13/17  4:47 AM  Result Value Ref Range Status   Specimen Description  BLOOD PICC LINE  Final   Special Requests   Final    BOTTLES DRAWN AEROBIC AND ANAEROBIC Blood Culture results may not be optimal due to an excessive volume of blood received in culture bottles AEROBIC BOTTLE Blood Culture results may not be optimal due to an inadequate volume of blood received in culture  bottles ANAEROBIC BOTTLE    Culture NO GROWTH 1 DAY  Final   Report Status PENDING  Incomplete      Lab Results  Component Value Date   CALCIUM 9.0 01/14/2017   CAION 0.86 (LL) 01/12/2017   PHOS 4.5 12/21/2016       Impression: 1)Renal   ESRD on HD                 Pt is on TTS schedule                 Pt had strong hx of non adhrenece to tx                 Pt had HD yesterday              2)CVS- Hemodynamically fragile              3)Anemia in ESRD the goal for hgb is 9-11 Pt admitted with Epistaxis Pt receving PRBC on as needed basis  4)Liver- Cirrhosis Sec to Steato hepatitis   5)Electrolytes  Hyperkalemia   Sec to nonadherence to renal rplacement therapy   Now better  Hyponatremia    Sec to ESRD+ Cirrhosis    6)Acid base Co2 at goal  7) CNS-admited with AMSPrimary MD  following  Plan:  Will continue curent tx Will dialyze in am    Melah Ebling S 01/14/2017, 11:22 PM

## 2017-01-14 NOTE — Progress Notes (Signed)
Subjective:  Patient sedated. Opening eyes with stimuli. Rectal tube was expelled and could not be reinserted due to perirectal swelling. Lactulose now being given via OGT. No melena per nursing staff.   Objective: Vital signs in last 24 hours: Temp:  [95 F (35 C)-100 F (37.8 C)] 99.3 F (37.4 C) (06/07 0700) Pulse Rate:  [107-126] 117 (06/07 0700) Resp:  [11-28] 14 (06/07 0700) BP: (86-160)/(38-73) 148/69 (06/07 0700) SpO2:  [97 %-100 %] 99 % (06/07 0700) FiO2 (%):  [35 %-40 %] 35 % (06/07 0256) Weight:  [159 lb 13.3 oz (72.5 kg)] 159 lb 13.3 oz (72.5 kg) (06/06 1210) Last BM Date: 01/13/17 General:   Arousable, intubated. NAD Head:  Normocephalic and atraumatic. Eyes:  Sclera clear, no icterus.  Chest: CTA bilaterally without rales, rhonchi, crackles.    Heart:  Regular rhythm, tachycardia; 2/6 SEM. Abdomen:  Soft, distended, tense. Hypoactive bowel sounds.   Extremities:  Without clubbing, deformity. Trace lower extremity edema. Neurologic:  Alert and  oriented x4;  grossly normal neurologically. Skin:  Right lower extremity with dressing. Multiple bruising.  Psych:  arousable and intubed  Intake/Output from previous day: 06/06 0701 - 06/07 0700 In: 1515.6 [I.V.:677.6; Blood:738; IV Piggyback:100] Out: 2150 [Emesis/NG output:800] Intake/Output this shift: No intake/output data recorded.  Lab Results: CBC  Recent Labs  01/13/17 0447 01/13/17 1623 01/14/17 0811  WBC 10.5 14.9* 9.4  HGB 8.3* 8.4* 6.2*  HCT 24.8* 25.4* 19.5*  MCV 90.5 89.8 93.3  PLT 110* 236 110*   BMET  Recent Labs  01/12/17 1824 01/13/17 0447 01/14/17 0811  NA 140 140 145  K 5.7* 5.8* 3.9  CL 101 101 103  CO2 14* 14* 23  GLUCOSE 121* 99 125*  BUN 120* 132* 69*  CREATININE 20.34* 20.48* 12.10*  CALCIUM 7.9* 8.0* 9.0   LFTs  Recent Labs  01/12/17 1824 01/13/17 0447 01/14/17 0811  BILITOT 0.8 1.0 1.4*  ALKPHOS 93 109 88  AST 37 36 38  ALT 15 17 18   PROT 6.3* 6.8 6.1*   ALBUMIN 2.8* 3.0* 2.7*    Recent Labs  01/12/17 1824  LIPASE 64*   PT/INR  Recent Labs  01/12/17 1824 01/13/17 0825  LABPROT 15.2 15.7*  INR 1.19 1.24   Ammonia level 27 01/14/17    Imaging Studies: Dg Chest 2 View  Result Date: 12/22/2016 CLINICAL DATA:  Dyspnea EXAM: CHEST  2 VIEW COMPARISON:  12/13/2016 chest radiograph. FINDINGS: Stable cardiomediastinal silhouette with mild cardiomegaly and aortic atherosclerosis. No pneumothorax. No pleural effusion. Borderline mild pulmonary edema. IMPRESSION: Borderline mild congestive heart failure. Aortic atherosclerosis. Electronically Signed   By: Ilona Sorrel M.D.   On: 12/22/2016 21:19   Dg Tibia/fibula Left  Result Date: 12/22/2016 CLINICAL DATA:  Pain and swelling of the left lower leg.  Blisters. EXAM: LEFT TIBIA AND FIBULA - 2 VIEW COMPARISON:  None. FINDINGS: No acute fracture nor bone destruction of the left tibia nor fibula. Joint spaces are maintained. Cutaneous nodular densities consistent with reported soft tissue blisters are seen with soft tissue induration potentially representing cellulitis or possibly changes of venous insufficiency. IMPRESSION: 1. Negative for acute fracture nor bone destruction. Joint dislocations. 2. Soft tissue induration with cutaneous masslike abnormalities consistent with reported history of blisters. Cellulitis or venous insufficiency might account for the subcutaneous soft tissue induration. Electronically Signed   By: Ashley Royalty M.D.   On: 12/22/2016 22:29   Dg Tibia/fibula Right  Result Date: 12/22/2016 CLINICAL DATA:  Diffuse blistering of  the lower extremity EXAM: RIGHT TIBIA AND FIBULA - 2 VIEW COMPARISON:  None. FINDINGS: There is subcutaneous soft tissue induration of the right leg possibly from venous insufficiency or cellulitis. Cutaneous masslike abnormalities are identified along the mid to lower leg consistent with reported history soft tissue blisters. No frank bone destruction is  identified. No acute fracture is noted. IMPRESSION: Negative for acute fracture or bone destruction. Soft tissue edema with cutaneous masslike abnormalities of the leg consistent with reported soft tissue blistering. Electronically Signed   By: Ashley Royalty M.D.   On: 12/22/2016 22:27   Ct Head Wo Contrast  Result Date: 01/13/2017 CLINICAL DATA:  Altered mental status.  Ventilator dependence. EXAM: CT HEAD WITHOUT CONTRAST TECHNIQUE: Contiguous axial images were obtained from the base of the skull through the vertex without intravenous contrast. COMPARISON:  09/29/2016 FINDINGS: Brain: There is no evidence for acute hemorrhage, hydrocephalus, mass lesion, or abnormal extra-axial fluid collection. No definite CT evidence for acute infarction. Vascular: No hyperdense vessel or unexpected calcification. Skull: No evidence for fracture. No worrisome lytic or sclerotic lesion. Sinuses/Orbits: Mucosal disease in the ethmoid air cells and maxillary sinus has progressed in the interval. Visualized portions of the globes and intraorbital fat are unremarkable. Other: None. IMPRESSION: 1. No acute intracranial abnormality. 2. Interval development of paranasal sinus disease, nonspecific in the setting of intubation. Electronically Signed   By: Misty Stanley M.D.   On: 01/13/2017 11:33   Dg Chest 1v Repeat Same Day  Result Date: 01/12/2017 CLINICAL DATA:  PICC line placement EXAM: CHEST - 1 VIEW SAME DAY COMPARISON:  1959 hours on the same day FINDINGS: Left-sided PICC line tip projects over the expected location of the proximal right atrium. Pullback approximately 2 cm. Stable cardiomegaly with aortic atherosclerosis and mild CHF. Low lung volumes are noted. No acute nor suspicious osseous lesions. IMPRESSION: 1. Tip of left-sided PICC line tip projects over the proximal right atrium. Pullback approximately 2 cm. 2. Cardiomegaly with aortic atherosclerosis and mild CHF. Electronically Signed   By: Ashley Royalty M.D.   On:  01/12/2017 22:44   Dg Chest Port 1 View  Result Date: 01/13/2017 CLINICAL DATA:  Status post orogastric tube placement. EXAM: PORTABLE CHEST 1 VIEW COMPARISON:  Chest x-ray of earlier today. FINDINGS: The orogastric tube tip projects at approximately the level of the pylorus or proximal duodenum. The proximal port projects in the distal gastric body. There are loops of mildly distended gas-filled small bowel in the left mid abdomen. IMPRESSION: Reasonable positioning of the orogastric tube. Electronically Signed   By: David  Martinique M.D.   On: 01/13/2017 09:13   Dg Chest Portable 1 View  Result Date: 01/13/2017 CLINICAL DATA:  Intubation. EXAM: PORTABLE CHEST 1 VIEW COMPARISON:  01/12/2017. FINDINGS: Endotracheal tube tip 1.9 cm above the lower portion of the carina. NG tube tip below left hemidiaphragm. Left PICC line in stable position. Cardiomegaly with pulmonary vascular congestion again noted without interim change. Low lung volumes with mild basilar atelectasis. No pneumothorax. IMPRESSION: 1. Endotracheal tube tip 1.9 cm above the lower portion of the carina. NG tube tip below left hemidiaphragm. Left PICC line stable position. 2. Cardiomegaly with pulmonary vascular congestion again noted. No interim change. 3. Low lung volumes with basilar atelectasis. Electronically Signed   By: Marcello Moores  Register   On: 01/13/2017 06:41   Dg Chest Port 1 View  Result Date: 01/12/2017 CLINICAL DATA:  Altered mentation and wheeze EXAM: PORTABLE CHEST 1 VIEW COMPARISON:  12/22/2016 FINDINGS:  Borderline cardiomegaly with aortic atherosclerosis. Slight interval increase in pulmonary vascular congestion consistent mild CHF. No pneumonic consolidation, effusion or pneumothorax. Minimal atelectasis and/or scarring at the right lung base. No acute nor suspicious osseous lesions. IMPRESSION: Interval increase in pulmonary vascular congestion consistent with mild CHF. Stable aortic atherosclerosis Electronically Signed   By:  Ashley Royalty M.D.   On: 01/12/2017 17:57   Dg Chest Port 1v Same Day  Result Date: 01/12/2017 CLINICAL DATA:  PICC line placement EXAM: PORTABLE CHEST 1 VIEW COMPARISON:  01/12/2017 FINDINGS: Left upper extremity catheter tip directed cephalad. Stable borderline cardiomegaly. Mild central vascular calcification. No focal consolidation or effusion. Aortic atherosclerosis. IMPRESSION: 1. Left upper extremity catheter tip directed cephalad, possibly within the azygos system. Repositioning suggested 2. Cardiomegaly with slight worsens in central vascular congestion. Electronically Signed   By: Donavan Foil M.D.   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 Found at home unresponsive, hypothermic, obtunded. In ED her ammonia level was 311, BUN 120, Creatinine 20. There was concern for possible UGI bleed as patient had blood coming from oropharynx/nose. Emergent EGD showed non-severe reflux esophagitis, mild gastritis, no esophageal varices, bloody return from OG felt to be due to epistaxis in setting of an opinion/platelet dysfunction from uremia.  H/H down today. Continues to have blood coming from upper airway.   Ammonia level normal today.  Has received one unit of platelets this admission.  Continues to be intubated for airway protection, being treated for possible aspiration pna, receiving fentanyl prn.   Plan: 1. Transfuse 2 units of prbcs today.  2. May require LVAP this admission if remains stable.  3. Will continue follow closely with you.   Laureen Ochs. Bernarda Caffey [facility] [phone] 6/7/20189:29 AM     LOS: 2 days

## 2017-01-14 NOTE — Progress Notes (Signed)
DR LE NOTIFIED OF COPIOUS BLOODY DRAINAGE FROM LT POSTERIOR CALF.

## 2017-01-14 NOTE — Consult Note (Addendum)
[facility] Nurse wound consult note Reason for Consult: Consult requested for bilat legs.  According to progress notes;, pt was noted to have blisters previously which have ruptured and evolved into multiple full thickness wounds to anterior and posterior calves; too many sites to measure.  Located across patchy areas of legs. Consult was performed using a remote camera and assistance from the bedside nurse for wound appearance. Right heel with previous blister which has evolved into a stage 2 pressure injury; 2X4X.1cm, red and moist, small amt pink drainage, no odor. Leg wounds are red and moist, small amt pink drainage, no odor, patchy areas of dark brown scabs are beginning to dry and lift off easily away from skin, revealing pink dry wound beds. Pressure Injury POA: Yes Dressing procedure/placement/frequency: Float heels to reduce pressure.  Foam dressing to protect right heel and promote healing.  Xeorform gauze to bilat calf wounds to promote healing.  Discussed plan of care with bedside nurse; topical treatment orders provided, no family members present. Please re-consult if further assistance is needed.  Thank-you,  Julien Girt MSN, [place], [place], [place], [place]

## 2017-01-14 NOTE — Progress Notes (Signed)
Initial Nutrition Assessment  DOCUMENTATION CODES:  Not applicable  INTERVENTION:  Pt has extremely high protein/kcal needs r/t severe wounds, cirrhosis, dialysis. If she is unable to extubate tomorrow and if medically able, would highly recommend initiation of tube feeding.   Tube feed recs: Vital 1.2 via OGT  at goal rate of 50 ml/h (1.2 ml per day) and Prostat 30 ml q24 hrs to provide 1540 kcals, 105 gm protein, 973 ml free water daily.  NUTRITION DIAGNOSIS:  Inadequate oral intake related to inability to eat as evidenced by NPO status.  GOAL:  Patient will meet greater than or equal to 90% of their needs  MONITOR:  Diet advancement, Vent status, Labs, Weight trends, Skin, I & O's  REASON FOR ASSESSMENT:  Ventilator    ASSESSMENT:  41 y/o female PMHx ESRD on HD, NASH cirrohisis, Noncompliance, chronic thrombocytopenia, GERD, Depression, Anxiety, Bipolar disorder. Presented via EMS after she was not seen at HD x2 weeks. Found unresponsive in home. In ED was hypothermic, NH3 311 and BUN 120/20.34. Intubated morning after arrival due to tachypnea and respiratory distress. Has large episode of bleeding during. Has wounds on both legs/sloughing of skin.   Pt is intubated. Flutters eyelids on RD assessment. She has gone roughly 36 hrs w/o nutrition. Has been having bleeding from upper airway. EGD did not show GIB. Had HD yesterday.   Patient is currently intubated on ventilator support MV: 7.5 L/min Temp (24hrs), Avg:99.5 F (37.5 C), Min:98.1 F (36.7 C), Max:100 F (37.8 C) Propofol: none  Physical exam: Severe ascites. Generalized swelling of extremities. Mild muscle wasting of temporal region. Mild fat wasting of orbital and tricep fat. Exam limited due to fluid overload.   Bed Weight today is 70.3 kg. Has lost 5 lbs since yesterday. Her weight history is nearly impossible to trend given her liver disease, ESRD on noncompliance. Dry weight appears to have been 125-130 during  her February/March hospital admission. However has been much greater since then. Per ntoes, may undergo LVAP  Will use 130 lbs for dosing weight.   Labs Glu: 155-125, H/H:6.2/19.5, Albumin: 2.7, Ammonia now WDL. Last phos taken 5/14: 4.5. K now corrected.  Meds: Fentanyl, lactulose, ppi, IV abx, IVF   Recent Labs Lab 01/12/17 1824 01/13/17 0447 01/14/17 0811  NA 140 140 145  K 5.7* 5.8* 3.9  CL 101 101 103  CO2 14* 14* 23  BUN 120* 132* 69*  CREATININE 20.34* 20.48* 12.10*  CALCIUM 7.9* 8.0* 9.0  GLUCOSE 121* 99 125*   Diet Order:  Diet NPO time specified  Skin:  Stage 2 to R heel, Stage 2-Open blister area on bilateral posterior+ anterior calves. Jaundiced. Severe ascites, generalized edema  Last BM:  6/6- incontinent  Height:  Ht Readings from Last 1 Encounters:  01/14/17 5\' 1"  (1.549 m)   Weight:  Wt Readings from Last 1 Encounters:  01/14/17 154 lb 15.7 oz (70.3 kg)   Wt Readings from Last 10 Encounters:  01/14/17 154 lb 15.7 oz (70.3 kg)  12/24/16 157 lb 3 oz (71.3 kg)  12/21/16 169 lb 8.5 oz (76.9 kg)  12/08/16 173 lb (78.5 kg)  11/26/16 166 lb 10.7 oz (75.6 kg)  10/30/16 146 lb 9.7 oz (66.5 kg)  10/10/16 124 lb 9 oz (56.5 kg)  09/30/16 127 lb 3.2 oz (57.7 kg)  09/27/16 133 lb 13.1 oz (60.7 kg)  09/19/16 160 lb (72.6 kg)   Ideal Body Weight:  47.73 kg  BMI:  Body mass index is  29.28 kg/m.  Dosing weight is 130 lbs (apparent dry wt from past admissions) Estimated Nutritional Needs:  Kcal:  1475 kcals (PSU 2003 B) Protein:  >95 g Pro (2 g/kg ibw) Fluid:  Per MD  EDUCATION NEEDS:   Education needs no appropriate at this time  Burtis Junes RD, LDN, [place] [facility] Pager: [phone] 01/14/2017 3:36 PM

## 2017-01-14 NOTE — Progress Notes (Signed)
LESLIE LEWIS PA-C NOTIFIED OF HBG 6.2.

## 2017-01-15 ENCOUNTER — Inpatient Hospital Stay (HOSPITAL_COMMUNITY): Payer: Medicare Other

## 2017-01-15 DIAGNOSIS — Z4659 Encounter for fitting and adjustment of other gastrointestinal appliance and device: Secondary | ICD-10-CM

## 2017-01-15 DIAGNOSIS — K7290 Hepatic failure, unspecified without coma: Secondary | ICD-10-CM

## 2017-01-15 DIAGNOSIS — N19 Unspecified kidney failure: Secondary | ICD-10-CM

## 2017-01-15 DIAGNOSIS — K7682 Hepatic encephalopathy: Secondary | ICD-10-CM

## 2017-01-15 DIAGNOSIS — Z992 Dependence on renal dialysis: Secondary | ICD-10-CM

## 2017-01-15 DIAGNOSIS — L139 Bullous disorder, unspecified: Secondary | ICD-10-CM

## 2017-01-15 DIAGNOSIS — D696 Thrombocytopenia, unspecified: Secondary | ICD-10-CM

## 2017-01-15 DIAGNOSIS — N186 End stage renal disease: Secondary | ICD-10-CM

## 2017-01-15 DIAGNOSIS — L03115 Cellulitis of right lower limb: Secondary | ICD-10-CM

## 2017-01-15 DIAGNOSIS — E8770 Fluid overload, unspecified: Secondary | ICD-10-CM

## 2017-01-15 LAB — CBC WITH DIFFERENTIAL/PLATELET
Basophils Absolute: 0.1 10*3/uL (ref 0.0–0.1)
Basophils Relative: 1 %
EOS ABS: 0.3 10*3/uL (ref 0.0–0.7)
Eosinophils Relative: 3 %
HCT: 23.3 % — ABNORMAL LOW (ref 36.0–46.0)
HEMOGLOBIN: 7.7 g/dL — AB (ref 12.0–15.0)
LYMPHS ABS: 1.1 10*3/uL (ref 0.7–4.0)
Lymphocytes Relative: 11 %
MCH: 30.1 pg (ref 26.0–34.0)
MCHC: 33 g/dL (ref 30.0–36.0)
MCV: 91 fL (ref 78.0–100.0)
Monocytes Absolute: 0.9 10*3/uL (ref 0.1–1.0)
Monocytes Relative: 9 %
NEUTROS PCT: 76 %
Neutro Abs: 7.6 10*3/uL (ref 1.7–7.7)
Platelets: 95 10*3/uL — ABNORMAL LOW (ref 150–400)
RBC: 2.56 MIL/uL — ABNORMAL LOW (ref 3.87–5.11)
RDW: 18.1 % — ABNORMAL HIGH (ref 11.5–15.5)
WBC: 10.1 10*3/uL (ref 4.0–10.5)

## 2017-01-15 LAB — CULTURE, BLOOD (ROUTINE X 2): Special Requests: ADEQUATE

## 2017-01-15 LAB — GLUCOSE, CAPILLARY
GLUCOSE-CAPILLARY: 112 mg/dL — AB (ref 65–99)
GLUCOSE-CAPILLARY: 103 mg/dL — AB (ref 65–99)
Glucose-Capillary: 81 mg/dL (ref 65–99)
Glucose-Capillary: 84 mg/dL (ref 65–99)
Glucose-Capillary: 86 mg/dL (ref 65–99)

## 2017-01-15 LAB — BASIC METABOLIC PANEL
Anion gap: 18 — ABNORMAL HIGH (ref 5–15)
BUN: 77 mg/dL — AB (ref 6–20)
CHLORIDE: 103 mmol/L (ref 101–111)
CO2: 21 mmol/L — AB (ref 22–32)
CREATININE: 12.97 mg/dL — AB (ref 0.44–1.00)
Calcium: 9 mg/dL (ref 8.9–10.3)
GFR calc Af Amer: 4 mL/min — ABNORMAL LOW (ref >60)
GFR calc non Af Amer: 3 mL/min — ABNORMAL LOW (ref >60)
Glucose, Bld: 108 mg/dL — ABNORMAL HIGH (ref 65–99)
Potassium: 4 mmol/L (ref 3.5–5.1)
SODIUM: 142 mmol/L (ref 135–145)

## 2017-01-15 LAB — AEROBIC CULTURE  (SUPERFICIAL SPECIMEN)

## 2017-01-15 LAB — AEROBIC CULTURE W GRAM STAIN (SUPERFICIAL SPECIMEN)

## 2017-01-15 MED ORDER — LIDOCAINE VISCOUS 2 % MT SOLN
10.0000 mL | OROMUCOSAL | Status: DC | PRN
  Administered 2017-01-15 – 2017-01-17 (×3): 10 mL via OROMUCOSAL
  Filled 2017-01-15 (×4): qty 15

## 2017-01-15 MED ORDER — FENTANYL 2500MCG IN NS 250ML (10MCG/ML) PREMIX INFUSION
INTRAVENOUS | Status: AC
  Filled 2017-01-15: qty 250

## 2017-01-15 MED ORDER — MORPHINE SULFATE (PF) 2 MG/ML IV SOLN
1.0000 mg | Freq: Once | INTRAVENOUS | Status: AC
  Administered 2017-01-15: 1 mg via INTRAVENOUS
  Filled 2017-01-15: qty 1

## 2017-01-15 MED ORDER — LACTULOSE 10 GM/15ML PO SOLN
30.0000 g | Freq: Three times a day (TID) | ORAL | Status: DC
  Filled 2017-01-15 (×4): qty 60

## 2017-01-15 MED ORDER — FENTANYL CITRATE (PF) 100 MCG/2ML IJ SOLN
12.5000 ug | INTRAMUSCULAR | Status: DC | PRN
  Administered 2017-01-15 – 2017-01-19 (×11): 12.5 ug via INTRAVENOUS
  Filled 2017-01-15 (×11): qty 2

## 2017-01-15 MED ORDER — EPOETIN ALFA 10000 UNIT/ML IJ SOLN
INTRAMUSCULAR | Status: AC
  Administered 2017-01-15: 10000 [IU] via SUBCUTANEOUS
  Filled 2017-01-15: qty 1

## 2017-01-15 MED ORDER — PHENOL 1.4 % MT LIQD
1.0000 | OROMUCOSAL | Status: DC | PRN
  Administered 2017-01-16 (×2): 1 via OROMUCOSAL
  Filled 2017-01-15: qty 177

## 2017-01-15 MED ORDER — MIDODRINE HCL 5 MG PO TABS
10.0000 mg | ORAL_TABLET | ORAL | Status: DC
  Administered 2017-01-19: 10 mg via ORAL
  Filled 2017-01-15: qty 2

## 2017-01-15 MED ORDER — ALBUMIN HUMAN 25 % IV SOLN
INTRAVENOUS | Status: AC
  Filled 2017-01-15: qty 200

## 2017-01-15 MED FILL — Medication: Qty: 1 | Status: AC

## 2017-01-15 NOTE — Progress Notes (Signed)
Wasted approximately  119ml of fentanyl witness by Rebeca Allegra RN

## 2017-01-15 NOTE — Procedures (Signed)
**Note De-Identified Panayiotis [place] Obfuscation** Extubation Procedure Note  Patient Details:   Name: Wanda [name] DOB: Aug 18, 1975 MRN: [id number]   Airway Documentation:     Evaluation  O2 sats: stable throughout Complications: No apparent complications Patient did tolerate procedure well. Bilateral Breath Sounds: Diminished   Yes VS, NIF, VC nl, + Leak, no stridor noted Johnica Armwood, Penni Bombard 01/15/2017, 9:42 AM

## 2017-01-15 NOTE — Progress Notes (Signed)
PROGRESS NOTE    Wanda [name]  EXB:[id number] DOB: [birth year] DOA: 01/12/2017 PCP: [name], MD    Brief Narrative: 41 yo with ESRD on HD, NASH, non compliance, thrombocytopenia, admitted for AMS, elevated NH3 to 300's, nasal bleeding, intubated for airway protection and lower extremities cellulitis.  She has been intubated for airways protection. She had EGD showing no GI bleed, and HD yesterday.  She is more alert today, blinking her eyes to answer questions.  She had some bleeding on her lower leg, but it eventually stopped.  Dr Standley Dakins planned to extubate her today.     Assessment & Plan:   Active Problems:   Liver cirrhosis secondary to nonalcoholic steatohepatitis (NASH) (HCC)   Thrombocytopenia (HCC)   End stage renal disease on dialysis (HCC)   Bullous skin disease   Altered mental status   Cellulitis  1. Altered mental status-  Likely due to hepatic encephalopathy.  Continue with Lactulose.  NH3 level is now normal at 27.  She appears to be better today. Will plan extubation. 2. End-stage renal disease-patient missed hemodialysis for past 2 weeks,potassium is 5.7, BUN 120, creatinine 20.34. She is due for dialysis today.  Cr is now 12 with K of 3.9. 3. Cellulitis/wound infection-patient has bullous skin disease, presenting with cellulitis and infected bulla of the right lower extremity.Wound culture has been obtained. Patient started on vancomycin and Zosyn as above. Wound care.  She has positive blood culture for CNS.  4. Chronic thrombocytopenia- likely from liver cirrhosis due to [facility], follow platelet count in a.m. 5. Liver cirrhosis,secondary to [place]- liver enzymes are stable, blood pressure stable. Hold oral medications due to altered mental status. She is now more alert.  Will give oral meds as required.  6    Intubation:  Weaning to extubation. Pulmonary consultation appreciated.   7.   Nasal bleeding;  SCD.  D/C protonix drip. D/C octreotide.  Q12 PPI.    DVT prophylaxis: SCD. Code Status: FULL CODE.  Family Communication: Son updated.  Disposition Plan: TBD.   Consultants:   Pulmonary.   Procedures:   Intubation.  Antimicrobials: Anti-infectives    Start     Dose/Rate Route Frequency Ordered Stop   01/15/17 1200  vancomycin (VANCOCIN) IVPB 750 mg/150 ml premix     750 mg 150 mL/hr over 60 Minutes Intravenous Every M-W-F (Hemodialysis) 01/14/17 0758     01/13/17 1800  vancomycin (VANCOCIN) IVPB 750 mg/150 ml premix     750 mg 150 mL/hr over 60 Minutes Intravenous  Once 01/13/17 1046 01/13/17 1630   01/13/17 1000  piperacillin-tazobactam (ZOSYN) IVPB 3.375 g     3.375 g 12.5 mL/hr over 240 Minutes Intravenous Every 12 hours 01/13/17 0741     01/12/17 1630  vancomycin (VANCOCIN) IVPB 1000 mg/200 mL premix     1,000 mg 200 mL/hr over 60 Minutes Intravenous  Once 01/12/17 1627 01/13/17 0133   01/12/17 1630  piperacillin-tazobactam (ZOSYN) IVPB 3.375 g     3.375 g 100 mL/hr over 30 Minutes Intravenous  Once 01/12/17 1627 01/13/17 0104       Subjective:  Intubated.   Followed command.   Objective: Vitals:   01/15/17 0400 01/15/17 0500 01/15/17 0600 01/15/17 0735  BP: (!) 168/93 (!) 162/87 (!) 172/94   Pulse: (!) 106 (!) 106 (!) 116   Resp: 15 15 18    Temp: 98.1 F (36.7 C) 97.9 F (36.6 C) 98.1 F (36.7 C)   TempSrc:      SpO2:  99% 99% 100% 99%  Weight:  71.3 kg (157 lb 3 oz)    Height:        Intake/Output Summary (Last 24 hours) at 01/15/17 0826 Last data filed at 01/15/17 0600  Gross per 24 hour  Intake          1458.83 ml  Output              400 ml  Net          1058.83 ml   Filed Weights   01/13/17 1210 01/14/17 1510 01/15/17 0500  Weight: 72.5 kg (159 lb 13.3 oz) 70.3 kg (154 lb 15.7 oz) 71.3 kg (157 lb 3 oz)    Examination:  General exam: Appears calm and comfortable  Respiratory system: Clear to auscultation. Respiratory effort normal. Mechanical ventilation.  Cardiovascular system: S1 &  S2 heard, RRR. No JVD, murmurs, rubs, gallops or clicks. No pedal edema. Gastrointestinal system: Abdomen is nondistended, soft and nontender. No organomegaly or masses felt. Normal bowel sounds heard. Central nervous system: Alert and oriented. No focal neurological deficits. Extremities: Symmetric 5 x 5 power. Skin: No rashes, lesions or ulcers Psychiatry: Judgement and insight appear normal. Mood & affect appropriate.   Data Reviewed: I have personally reviewed following labs and imaging studies  CBC:  Recent Labs Lab 01/12/17 1824 01/13/17 0447 01/13/17 1623 01/14/17 0811 01/14/17 1948  WBC 6.6 10.5 14.9* 9.4  --   NEUTROABS 5.5  --  12.9* 7.3  --   HGB 8.1* 8.3* 8.4* 6.2* 8.1*  HCT 24.7* 24.8* 25.4* 19.5* 24.1*  MCV 91.1 90.5 89.8 93.3  --   PLT 82* 110* 236 110*  --    Basic Metabolic Panel:  Recent Labs Lab 01/12/17 1723 01/12/17 1824 01/13/17 0447 01/14/17 0811  NA 135 140 140 145  K 5.8* 5.7* 5.8* 3.9  CL 104 101 101 103  CO2  --  14* 14* 23  GLUCOSE 117* 121* 99 125*  BUN 133* 120* 132* 69*  CREATININE >18.00* 20.34* 20.48* 12.10*  CALCIUM  --  7.9* 8.0* 9.0   GFR: Estimated Creatinine Clearance: 5.5 mL/min (A) (by C-G formula based on SCr of 12.1 mg/dL (H)). Liver Function Tests:  Recent Labs Lab 01/12/17 1824 01/13/17 0447 01/14/17 0811  AST 37 36 38  ALT 15 17 18   ALKPHOS 93 109 88  BILITOT 0.8 1.0 1.4*  PROT 6.3* 6.8 6.1*  ALBUMIN 2.8* 3.0* 2.7*    Recent Labs Lab 01/12/17 1824  LIPASE 64*    Recent Labs Lab 01/12/17 1824 01/13/17 0447 01/14/17 0811  AMMONIA 311* 205* 27   Coagulation Profile:  Recent Labs Lab 01/12/17 1824 01/13/17 0825  INR 1.19 1.24   Cardiac Enzymes:  Recent Labs Lab 01/12/17 1824  TROPONINI <0.03   CBG:  Recent Labs Lab 01/14/17 1156 01/14/17 1626 01/14/17 2026 01/14/17 2319 01/15/17 0742  GLUCAP 121* 117* 111* 123* 112*   Sepsis Labs:  Recent Labs Lab 01/12/17 1725 01/12/17 2126   LATICACIDVEN 2.40* 1.9    Recent Results (from the past 240 hour(s))  Wound or Superficial Culture     Status: None (Preliminary result)   Collection Time: 01/12/17  5:50 PM  Result Value Ref Range Status   Specimen Description LEG RIGHT  Final   Special Requests Immunocompromised  Final   Gram Stain   Final    MODERATE WBC PRESENT, PREDOMINANTLY PMN ABUNDANT GRAM NEGATIVE RODS    Culture   Final    ABUNDANT  KLEBSIELLA OXYTOCA SUSCEPTIBILITIES TO FOLLOW Performed at [facility], [place] [address], [place], [place] [address]    Report Status PENDING  Incomplete  Culture, blood (Routine X 2) w Reflex to ID Panel     Status: Abnormal (Preliminary result)   Collection Time: 01/12/17  6:24 PM  Result Value Ref Range Status   Specimen Description BLOOD LEFT HAND  Final   Special Requests Blood Culture adequate volume  Final   Culture  Setup Time   Final    GRAM POSITIVE COCCI IN CLUSTERS Gram Stain Report Called to,Read Back By and Verified With: MOTLEY J. AT 1320 BY THOMPSON S. ON [id number] CRITICAL RESULT CALLED TO, READ BACK BY AND VERIFIED WITH: J. MOTLEY, RN (APH) AT 1808 ON 01/13/17 BY C. JESSUP, MLT.    Culture (A)  Final    STAPHYLOCOCCUS SPECIES (COAGULASE NEGATIVE) THE SIGNIFICANCE OF ISOLATING THIS ORGANISM FROM A SINGLE SET OF BLOOD CULTURES WHEN MULTIPLE SETS ARE DRAWN IS UNCERTAIN. PLEASE NOTIFY THE MICROBIOLOGY DEPARTMENT WITHIN ONE WEEK IF SPECIATION AND SENSITIVITIES ARE REQUIRED. Performed at [facility], [place] [address] [place], [place] [address]    Report Status PENDING  Incomplete  Blood Culture ID Panel (Reflexed)     Status: Abnormal   Collection Time: 01/12/17  6:24 PM  Result Value Ref Range Status   Enterococcus species NOT DETECTED NOT DETECTED Final   Listeria monocytogenes NOT DETECTED NOT DETECTED Final   Staphylococcus species DETECTED (A) NOT DETECTED Final    Comment: Methicillin (oxacillin) resistant coagulase negative staphylococcus.  Possible blood culture contaminant (unless isolated from more than one blood culture draw or clinical case suggests pathogenicity). No antibiotic treatment is indicated for blood  culture contaminants. CRITICAL RESULT CALLED TO, READ BACK BY AND VERIFIED WITH: J. MOTLEY, RN (APH) ON AT 1808 ON 01/13/17 BY C. JESSUP, MLT.    Staphylococcus aureus NOT DETECTED NOT DETECTED Final   Methicillin resistance DETECTED (A) NOT DETECTED Final    Comment: CRITICAL RESULT CALLED TO, READ BACK BY AND VERIFIED WITH: J. MOTLEY, RN (APH) AT 1808 ON 01/13/17 BY C. JESSUP, MLT.    Streptococcus species NOT DETECTED NOT DETECTED Final   Streptococcus agalactiae NOT DETECTED NOT DETECTED Final   Streptococcus pneumoniae NOT DETECTED NOT DETECTED Final   Streptococcus pyogenes NOT DETECTED NOT DETECTED Final   Acinetobacter baumannii NOT DETECTED NOT DETECTED Final   Enterobacteriaceae species NOT DETECTED NOT DETECTED Final   Enterobacter cloacae complex NOT DETECTED NOT DETECTED Final   Escherichia coli NOT DETECTED NOT DETECTED Final   Klebsiella oxytoca NOT DETECTED NOT DETECTED Final   Klebsiella pneumoniae NOT DETECTED NOT DETECTED Final   Proteus species NOT DETECTED NOT DETECTED Final   Serratia marcescens NOT DETECTED NOT DETECTED Final   Haemophilus influenzae NOT DETECTED NOT DETECTED Final   Neisseria meningitidis NOT DETECTED NOT DETECTED Final   Pseudomonas aeruginosa NOT DETECTED NOT DETECTED Final   Candida albicans NOT DETECTED NOT DETECTED Final   Candida glabrata NOT DETECTED NOT DETECTED Final   Candida krusei NOT DETECTED NOT DETECTED Final   Candida parapsilosis NOT DETECTED NOT DETECTED Final   Candida tropicalis NOT DETECTED NOT DETECTED Final    Comment: Performed at [facility], [place] [address], [place], [place] [address]  MRSA PCR Screening     Status: None   Collection Time: 01/13/17 12:09 AM  Result Value Ref Range Status   MRSA by PCR NEGATIVE NEGATIVE Final    Comment:  The GeneXpert MRSA Assay (FDA approved for NASAL specimens only), is one component of a comprehensive MRSA colonization surveillance program. It is not intended to diagnose MRSA infection nor to guide or monitor treatment for MRSA infections.   Culture, blood (Routine X 2) w Reflex to ID Panel     Status: None (Preliminary result)   Collection Time: 01/13/17  4:47 AM  Result Value Ref Range Status   Specimen Description BLOOD PICC LINE  Final   Special Requests   Final    BOTTLES DRAWN AEROBIC AND ANAEROBIC Blood Culture results may not be optimal due to an excessive volume of blood received in culture bottles AEROBIC BOTTLE Blood Culture results may not be optimal due to an inadequate volume of blood received in culture  bottles ANAEROBIC BOTTLE    Culture NO GROWTH 1 DAY  Final   Report Status PENDING  Incomplete     Radiology Studies: Ct Head Wo Contrast  Result Date: 01/13/2017 CLINICAL DATA:  Altered mental status.  Ventilator dependence. EXAM: CT HEAD WITHOUT CONTRAST TECHNIQUE: Contiguous axial images were obtained from the base of the skull through the vertex without intravenous contrast. COMPARISON:  09/29/2016 FINDINGS: Brain: There is no evidence for acute hemorrhage, hydrocephalus, mass lesion, or abnormal extra-axial fluid collection. No definite CT evidence for acute infarction. Vascular: No hyperdense vessel or unexpected calcification. Skull: No evidence for fracture. No worrisome lytic or sclerotic lesion. Sinuses/Orbits: Mucosal disease in the ethmoid air cells and maxillary sinus has progressed in the interval. Visualized portions of the globes and intraorbital fat are unremarkable. Other: None. IMPRESSION: 1. No acute intracranial abnormality. 2. Interval development of paranasal sinus disease, nonspecific in the setting of intubation. Electronically Signed   By: Misty Stanley M.D.   On: 01/13/2017 11:33   Dg Chest Port 1 View  Result Date: 01/15/2017 CLINICAL  DATA:  Respiratory failure EXAM: PORTABLE CHEST 1 VIEW COMPARISON:  01/14/2017 FINDINGS: Cardiac shadow is stable. Endotracheal tube and nasogastric catheter are noted in satisfactory position. The lungs are well aerated bilaterally. Minimal platelike atelectasis is noted in the left base. No new focal abnormality is seen. IMPRESSION: Minimal left basilar atelectasis. No new focal abnormality is noted. Electronically Signed   By: Inez Catalina M.D.   On: 01/15/2017 07:23   Dg Chest Port 1 View  Result Date: 01/14/2017 CLINICAL DATA:  Respiratory failure EXAM: PORTABLE CHEST 1 VIEW COMPARISON:  01/13/2017 FINDINGS: ET tube tip is above the carina. There is a left arm PICC line with tip in the cavoatrial junction. OG tube tip is in the stomach. Aortic atherosclerosis. Normal heart size. Decrease in left pleural effusion and pulmonary vascular congestion. IMPRESSION: 1. Decrease in left pleural effusion and pulmonary vascular congestion. Electronically Signed   By: Kerby Moors M.D.   On: 01/14/2017 09:01   Dg Chest Port 1 View  Result Date: 01/13/2017 CLINICAL DATA:  Status post orogastric tube placement. EXAM: PORTABLE CHEST 1 VIEW COMPARISON:  Chest x-ray of earlier today. FINDINGS: The orogastric tube tip projects at approximately the level of the pylorus or proximal duodenum. The proximal port projects in the distal gastric body. There are loops of mildly distended gas-filled small bowel in the left mid abdomen. IMPRESSION: Reasonable positioning of the orogastric tube. Electronically Signed   By: David  Martinique M.D.   On: 01/13/2017 09:13    Scheduled Meds: . chlorhexidine gluconate (MEDLINE KIT)  15 mL Mouth Rinse BID  . Chlorhexidine Gluconate Cloth  6 each Topical Daily  .  epoetin (EPOGEN/PROCRIT) injection  10,000 Units Subcutaneous Q M,W,F-HD  . fentaNYL (SUBLIMAZE) injection  50 mcg Intravenous Once  . lactulose  30 g Per Tube Q6H  . mouth rinse  15 mL Mouth Rinse QID  . pantoprazole  (PROTONIX) IV  40 mg Intravenous BID AC  . sodium chloride flush  10-40 mL Intracatheter Q12H  . sodium chloride flush  3 mL Intravenous Q12H   Continuous Infusions: . sodium chloride Stopped (01/13/17 0948)  . sodium chloride    . sodium chloride    . fentaNYL infusion INTRAVENOUS 400 mcg/hr (01/15/17 0657)  . piperacillin-tazobactam (ZOSYN)  IV Stopped (01/15/17 0157)  . vancomycin       LOS: 3 days   Cashmere Dingley, MD [facility].   If 7PM-7AM, please contact night-coverage [email or web address] Password [facility] 01/15/2017, 8:26 AM

## 2017-01-15 NOTE — Progress Notes (Signed)
Wanda [name]  MRN: [id number]  DOB/AGE: 1976/01/27 41 y.o.  Primary Care Physician:Roberson, [name], MD  Admit date: 01/12/2017  Chief Complaint:  Chief Complaint  Patient presents with  . Altered Mental Status    S-Pt presented on  01/12/2017 with  Chief Complaint  Patient presents with  . Altered Mental Status  .     Pt offers no new  complaints . Pt main concern was I need midodrine  Before dialysis.  Meds . chlorhexidine gluconate (MEDLINE KIT)  15 mL Mouth Rinse BID  . Chlorhexidine Gluconate Cloth  6 each Topical Daily  . epoetin (EPOGEN/PROCRIT) injection  10,000 Units Subcutaneous Q M,W,F-HD  . fentaNYL (SUBLIMAZE) injection  50 mcg Intravenous Once  . lactulose  30 g Per Tube Q6H  . mouth rinse  15 mL Mouth Rinse QID  . pantoprazole (PROTONIX) IV  40 mg Intravenous BID AC  . sodium chloride flush  10-40 mL Intracatheter Q12H  . sodium chloride flush  3 mL Intravenous Q12H     Physical Exam: Vital signs in last 24 hours: Temp:  [97.9 F (36.6 C)-99.5 F (37.5 C)] 98.4 F (36.9 C) (06/08 0800) Pulse Rate:  [105-129] 106 (06/08 0800) Resp:  [8-23] 8 (06/08 0800) BP: (123-178)/(63-98) 178/92 (06/08 0800) SpO2:  [94 %-100 %] 98 % (06/08 0800) FiO2 (%):  [30 %] 30 % (06/08 0800) Weight:  [154 lb 15.7 oz (70.3 kg)-157 lb 3 oz (71.3 kg)] 157 lb 3 oz (71.3 kg) (06/08 0500) Weight change: -4 lb 13.6 oz (-2.2 kg) Last BM Date: 01/13/17  Intake/Output from previous day: 06/07 0701 - 06/08 0700 In: 1498.8 [I.V.:1158.8; Blood:240; IV Piggyback:100] Out: 400 [Emesis/NG output:400] Total I/O In: 80 [I.V.:80] Out: -    Physical Exam: General- pt is awake, follow commands Resp- No distressu, decreased at bases CVS- S1S2 regular in rate and rhythm GIT- BS+, soft, umbilical hernia present, distended, Ascitic tap needle in situ  EXT- 1+ LE Edema,NO Cyanosis. Chronic venous stasis changes present. Access-  AVF+  Lab Results: CBC  Recent Labs  01/14/17 0811  01/14/17 1948 01/15/17 0800  WBC 9.4  --  10.1  HGB 6.2* 8.1* 7.7*  HCT 19.5* 24.1* 23.3*  PLT 110*  --  95*    BMET  Recent Labs  01/14/17 0811 01/15/17 0800  NA 145 142  K 3.9 4.0  CL 103 103  CO2 23 21*  GLUCOSE 125* 108*  BUN 69* 77*  CREATININE 12.10* 12.97*  CALCIUM 9.0 9.0    MICRO Recent Results (from the past 240 hour(s))  Wound or Superficial Culture     Status: None (Preliminary result)   Collection Time: 01/12/17  5:50 PM  Result Value Ref Range Status   Specimen Description LEG RIGHT  Final   Special Requests Immunocompromised  Final   Gram Stain   Final    MODERATE WBC PRESENT, PREDOMINANTLY PMN ABUNDANT GRAM NEGATIVE RODS    Culture   Final    ABUNDANT KLEBSIELLA OXYTOCA SUSCEPTIBILITIES TO FOLLOW Performed at [facility], [address] [place], [place] [address]    Report Status PENDING  Incomplete  Culture, blood (Routine X 2) w Reflex to ID Panel     Status: Abnormal   Collection Time: 01/12/17  6:24 PM  Result Value Ref Range Status   Specimen Description BLOOD LEFT HAND  Final   Special Requests Blood Culture adequate volume  Final   Culture  Setup Time   Final  GRAM POSITIVE COCCI IN CLUSTERS Gram Stain Report Called to,Read Back By and Verified With: MOTLEY J. AT 1320 BY THOMPSON S. ON [id number] CRITICAL RESULT CALLED TO, READ BACK BY AND VERIFIED WITH: J. MOTLEY, RN (APH) AT 1808 ON 01/13/17 BY C. JESSUP, MLT.    Culture (A)  Final    STAPHYLOCOCCUS SPECIES (COAGULASE NEGATIVE) THE SIGNIFICANCE OF ISOLATING THIS ORGANISM FROM A SINGLE SET OF BLOOD CULTURES WHEN MULTIPLE SETS ARE DRAWN IS UNCERTAIN. PLEASE NOTIFY THE MICROBIOLOGY DEPARTMENT WITHIN ONE WEEK IF SPECIATION AND SENSITIVITIES ARE REQUIRED. Performed at [facility], [place] [address], [place], [place] [address]    Report Status 01/15/2017 FINAL  Final  Blood Culture ID Panel (Reflexed)     Status: Abnormal   Collection Time: 01/12/17  6:24 PM  Result Value Ref  Range Status   Enterococcus species NOT DETECTED NOT DETECTED Final   Listeria monocytogenes NOT DETECTED NOT DETECTED Final   Staphylococcus species DETECTED (A) NOT DETECTED Final    Comment: Methicillin (oxacillin) resistant coagulase negative staphylococcus. Possible blood culture contaminant (unless isolated from more than one blood culture draw or clinical case suggests pathogenicity). No antibiotic treatment is indicated for blood  culture contaminants. CRITICAL RESULT CALLED TO, READ BACK BY AND VERIFIED WITH: J. MOTLEY, RN (APH) ON AT 1808 ON 01/13/17 BY C. JESSUP, MLT.    Staphylococcus aureus NOT DETECTED NOT DETECTED Final   Methicillin resistance DETECTED (A) NOT DETECTED Final    Comment: CRITICAL RESULT CALLED TO, READ BACK BY AND VERIFIED WITH: J. MOTLEY, RN (APH) AT 1808 ON 01/13/17 BY C. JESSUP, MLT.    Streptococcus species NOT DETECTED NOT DETECTED Final   Streptococcus agalactiae NOT DETECTED NOT DETECTED Final   Streptococcus pneumoniae NOT DETECTED NOT DETECTED Final   Streptococcus pyogenes NOT DETECTED NOT DETECTED Final   Acinetobacter baumannii NOT DETECTED NOT DETECTED Final   Enterobacteriaceae species NOT DETECTED NOT DETECTED Final   Enterobacter cloacae complex NOT DETECTED NOT DETECTED Final   Escherichia coli NOT DETECTED NOT DETECTED Final   Klebsiella oxytoca NOT DETECTED NOT DETECTED Final   Klebsiella pneumoniae NOT DETECTED NOT DETECTED Final   Proteus species NOT DETECTED NOT DETECTED Final   Serratia marcescens NOT DETECTED NOT DETECTED Final   Haemophilus influenzae NOT DETECTED NOT DETECTED Final   Neisseria meningitidis NOT DETECTED NOT DETECTED Final   Pseudomonas aeruginosa NOT DETECTED NOT DETECTED Final   Candida albicans NOT DETECTED NOT DETECTED Final   Candida glabrata NOT DETECTED NOT DETECTED Final   Candida krusei NOT DETECTED NOT DETECTED Final   Candida parapsilosis NOT DETECTED NOT DETECTED Final   Candida tropicalis NOT DETECTED  NOT DETECTED Final    Comment: Performed at [facility], [place] [address] [place], [place] [address]  MRSA PCR Screening     Status: None   Collection Time: 01/13/17 12:09 AM  Result Value Ref Range Status   MRSA by PCR NEGATIVE NEGATIVE Final    Comment:        The GeneXpert MRSA Assay (FDA approved for NASAL specimens only), is one component of a comprehensive MRSA colonization surveillance program. It is not intended to diagnose MRSA infection nor to guide or monitor treatment for MRSA infections.   Culture, blood (Routine X 2) w Reflex to ID Panel     Status: None (Preliminary result)   Collection Time: 01/13/17  4:47 AM  Result Value Ref Range Status   Specimen Description BLOOD PICC LINE  Final  Special Requests   Final    BOTTLES DRAWN AEROBIC AND ANAEROBIC Blood Culture results may not be optimal due to an excessive volume of blood received in culture bottles AEROBIC BOTTLE Blood Culture results may not be optimal due to an inadequate volume of blood received in culture  bottles ANAEROBIC BOTTLE    Culture NO GROWTH 2 DAYS  Final   Report Status PENDING  Incomplete      Lab Results  Component Value Date   CALCIUM 9.0 01/15/2017   CAION 0.86 (LL) 01/12/2017   PHOS 4.5 12/21/2016       Impression: 1)Renal   ESRD on HD                 Pt is on TTS schedule                 Pt had strong hx of non adhrenece to tx                 Pt had HD yesterday              2)CVS- Hemodynamically fragile              3)Anemia in ESRD the goal for hgb is 9-11 Pt admitted with Epistaxis Pt receving PRBC on as needed basis  4)Liver- Cirrhosis Sec to Steato hepatitis   5)Electrolytes  Hyperkalemia   Sec to nonadherence to renal rplacement therapy   Now better  Hyponatremia    Sec to ESRD+ Cirrhosis    6)Acid base Co2 at goal  7) CNS-admited with AMS  Clinically better Primary MD following  Plan:  Will  Dialyze today Will use 3k bath Will keep on  epo     Chessa Barrasso S 01/15/2017, 9:23 AM

## 2017-01-15 NOTE — Progress Notes (Signed)
Subjective: Very limited due to intubation. Awake, tries to talk/blink/nod to answer questions but difficult to decipher.  Objective: Vital signs in last 24 hours: Temp:  [97.9 F (36.6 C)-99.5 F (37.5 C)] 98.4 F (36.9 C) (06/08 0800) Pulse Rate:  [105-129] 106 (06/08 0800) Resp:  [8-23] 8 (06/08 0800) BP: (123-178)/(63-98) 178/92 (06/08 0800) SpO2:  [94 %-100 %] 98 % (06/08 0800) FiO2 (%):  [30 %] 30 % (06/08 0800) Weight:  [154 lb 15.7 oz (70.3 kg)-157 lb 3 oz (71.3 kg)] 157 lb 3 oz (71.3 kg) (06/08 0500) Last BM Date: 01/13/17 General:   Seems frustrated, tearful during visit. Head:  Normocephalic and atraumatic. Eyes:  No icterus, sclera clear. Conjuctiva pink.  Heart:  S1, S2 present, 2/6 blowing systolic murmur noted, apparent tachycardic rate.  Lungs: Bilateral adventitious sounds on inspiration likely patrtially from "fighting the vent" during wean mode..  Abdomen:  Bowel sounds present. Abdomen slightly distended and firm with obvious umbilical hernia; shakes hands "so-so" to abdominal TTP. No rebound or guarding. Msk:  Symmetrical without gross deformities. Pulses:  Normal bilateral DP pulses noted. Extremities:  Without clubbing. Neurologic:  Unable to determine orientation due to ventilated. Skin:  Bilateral LE dressings in place noted clean, dry, intact with no bloody leakthrough..  Psych:  Seems frustrated, trying to fight the vent, trying to talk; tearful at times..  Intake/Output from previous day: 06/07 0701 - 06/08 0700 In: 1498.8 [I.V.:1158.8; Blood:240; IV Piggyback:100] Out: 400 [Emesis/NG output:400] Intake/Output this shift: Total I/O In: 80 [I.V.:80] Out: -   Lab Results:  Recent Labs  01/13/17 1623 01/14/17 0811 01/14/17 1948 01/15/17 0800  WBC 14.9* 9.4  --  10.1  HGB 8.4* 6.2* 8.1* 7.7*  HCT 25.4* 19.5* 24.1* 23.3*  PLT 236 110*  --  95*   BMET  Recent Labs  01/13/17 0447 01/14/17 0811 01/15/17 0800  NA 140 145 142  K 5.8*  3.9 4.0  CL 101 103 103  CO2 14* 23 21*  GLUCOSE 99 125* 108*  BUN 132* 69* 77*  CREATININE 20.48* 12.10* 12.97*  CALCIUM 8.0* 9.0 9.0   LFT  Recent Labs  01/12/17 1824 01/13/17 0447 01/14/17 0811  PROT 6.3* 6.8 6.1*  ALBUMIN 2.8* 3.0* 2.7*  AST 37 36 38  ALT 15 17 18   ALKPHOS 93 109 88  BILITOT 0.8 1.0 1.4*   PT/INR  Recent Labs  01/12/17 1824 01/13/17 0825  LABPROT 15.2 15.7*  INR 1.19 1.24   Hepatitis Panel No results for input(s): HEPBSAG, HCVAB, HEPAIGM, HEPBIGM in the last 72 hours.   Studies/Results: Ct Head Wo Contrast  Result Date: 01/13/2017 CLINICAL DATA:  Altered mental status.  Ventilator dependence. EXAM: CT HEAD WITHOUT CONTRAST TECHNIQUE: Contiguous axial images were obtained from the base of the skull through the vertex without intravenous contrast. COMPARISON:  09/29/2016 FINDINGS: Brain: There is no evidence for acute hemorrhage, hydrocephalus, mass lesion, or abnormal extra-axial fluid collection. No definite CT evidence for acute infarction. Vascular: No hyperdense vessel or unexpected calcification. Skull: No evidence for fracture. No worrisome lytic or sclerotic lesion. Sinuses/Orbits: Mucosal disease in the ethmoid air cells and maxillary sinus has progressed in the interval. Visualized portions of the globes and intraorbital fat are unremarkable. Other: None. IMPRESSION: 1. No acute intracranial abnormality. 2. Interval development of paranasal sinus disease, nonspecific in the setting of intubation. Electronically Signed   By: Misty Stanley M.D.   On: 01/13/2017 11:33   Dg Chest [facility]  Result Date: 01/15/2017 CLINICAL DATA:  Respiratory failure EXAM: PORTABLE CHEST 1 VIEW COMPARISON:  01/14/2017 FINDINGS: Cardiac shadow is stable. Endotracheal tube and nasogastric catheter are noted in satisfactory position. The lungs are well aerated bilaterally. Minimal platelike atelectasis is noted in the left base. No new focal abnormality is seen.  IMPRESSION: Minimal left basilar atelectasis. No new focal abnormality is noted. Electronically Signed   By: Inez Catalina M.D.   On: 01/15/2017 07:23   Dg Chest Port 1 View  Result Date: 01/14/2017 CLINICAL DATA:  Respiratory failure EXAM: PORTABLE CHEST 1 VIEW COMPARISON:  01/13/2017 FINDINGS: ET tube tip is above the carina. There is a left arm PICC line with tip in the cavoatrial junction. OG tube tip is in the stomach. Aortic atherosclerosis. Normal heart size. Decrease in left pleural effusion and pulmonary vascular congestion. IMPRESSION: 1. Decrease in left pleural effusion and pulmonary vascular congestion. Electronically Signed   By: Kerby Moors M.D.   On: 01/14/2017 09:01   Dg Chest Port 1 View  Result Date: 01/13/2017 CLINICAL DATA:  Status post orogastric tube placement. EXAM: PORTABLE CHEST 1 VIEW COMPARISON:  Chest x-ray of earlier today. FINDINGS: The orogastric tube tip projects at approximately the level of the pylorus or proximal duodenum. The proximal port projects in the distal gastric body. There are loops of mildly distended gas-filled small bowel in the left mid abdomen. IMPRESSION: Reasonable positioning of the orogastric tube. Electronically Signed   By: David  Martinique M.D.   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 Found at home unresponsive, hypothermic, obtunded. In ED her ammonia level was 311, BUN 120, Creatinine 20. There was concern for possible UGI bleed as patient had blood coming from oropharynx/nose. Emergent EGD showed non-severe reflux esophagitis, mild gastritis, no esophageal varices, bloody return from OG felt to be due to epistaxis in setting of an opinion/platelet dysfunction from uremia.   No further OP bleeding noted; did have bleeding cellulitis now with dressing intact; H/H yesterday at 6.2 and given 2 U PRBC with increase to  8.1, down today to 7.7 likely from cellulitis bleeding and single bloody bowel movement 7pm last night with no further bleeding.  Ammonia level normal yesterday. Cr improved today to 12.97. Much more awake but limited subjective exam due to intubation  Has received one unit of platelets this admission.  Continues to be intubated for airway protection, being treated for possible aspiration pna. She is on attempted wean today for possible extubation.  Bilateral legs with cellulitis and sloughing/bleeding. Currently with dressing intact and no bleeding through the dressing. Being seen by [facility] nurse.  Overall she is clinically improving both on exam and on labs. She may need LVAP in the next 1-2 days depending on clinical progression as her abdomen seems firm/distended at this time. May be better to wait for her to stabilize from ventilator wean.  Plan: 1. Monitor for further significant bleeding and notify us if there is any 2. Check CBC tomorrow 3. Continue lactulose, notify if any diarrhea develops (may need to titrate dose at that time) 4. Transfuse as necessary 5. Consider LVAP pending patient clinical stability/ability to tolerate 6. Supportive measures 7. Continued wound care  Thank you for allowing [place] to participate in the care of [place], DNP, AGNP-C Adult & Gerontological Nurse Practitioner [facility]  LOS: 3 days    01/15/2017, 9:05 AM

## 2017-01-15 NOTE — Progress Notes (Signed)
Pharmacy Antibiotic Note  Wanda [name] is a 41 y.o. female admitted on 01/12/2017 with cellulitis.  Pharmacy has been consulted for Vancomycin and zosyn dosing. HD patient but non-compliant.  Current HD schedule is MWF  Plan: Continue Zosyn 3.375 IV q12h EID Continue Vancomycin 750mg  post HD MWF F/U cxs and clinical progress Monitor V/S, labs and levels as indicated  Height: 5\' 1"  (154.9 cm) Weight: 157 lb 3 oz (71.3 kg) IBW/kg (Calculated) : 47.8  Temp (24hrs), Avg:98.5 F (36.9 C), Min:97.9 F (36.6 C), Max:99.5 F (37.5 C)   Recent Labs Lab 01/12/17 1723 01/12/17 1725 01/12/17 1824 01/12/17 2126 01/13/17 0447 01/13/17 1623 01/14/17 0811 01/15/17 0800  WBC  --   --  6.6  --  10.5 14.9* 9.4 10.1  CREATININE >18.00*  --  20.34*  --  20.48*  --  12.10* 12.97*  LATICACIDVEN  --  2.40*  --  1.9  --   --   --   --     Estimated Creatinine Clearance: 5.1 mL/min (A) (by C-G formula based on SCr of 12.97 mg/dL (H)).    Allergies  Allergen Reactions  . Iohexol Rash    Pt states rash after last CT. Pt needs pre-meds  . Lasix [Furosemide] Other (See Comments)    "doesn't work"  . Latex Itching   Antimicrobials this admission: Zosyn 6/6>>  Vancomycin 6/6 >>   Dose adjustments this admission: N/A  Microbiology results: 6/5 BCx: 1/2 w/ MRSA detected CONS 6/5 Wound Cx: Klebsiella Oxytoca S to Zosyn 6/6 MRSA PCR: neg  Thank you for allowing pharmacy to be a part of this patient's care.  Hart Robinsons, PharmD Clinical Pharmacist Pager:  [phone] 01/15/2017   01/15/2017 11:26 AM

## 2017-01-15 NOTE — Procedures (Signed)
HEMODIALYSIS TREATMENT NOTE:  3.5 hour heparin-free dialysis completed via right upper arm AVF (15g/antegrade). Goal met: 2 liters removed without interruption in ultrafiltration.  All blood was returned and hemostasis was achieved within 10 minutes. Report given to Lawrence Hylton, RN.  Angela Poteat, RN, CDN 

## 2017-01-15 NOTE — Progress Notes (Signed)
Subjective: Much more alert today. She answers questions by nodding or shaking her head. She doesn't appear to have any more bleeding except some out of her leg according to her nurse last night. Her hemoglobin level has come up. Other blood tests are pending. Chest x-ray has not been officially read but I have personally reviewed it and do not see any definite pneumonia. Her secretions are less bloody.  Objective: Vital signs in last 24 hours: Temp:  [97.9 F (36.6 C)-99.5 F (37.5 C)] 98.1 F (36.7 C) (06/08 0600) Pulse Rate:  [105-129] 116 (06/08 0600) Resp:  [11-24] 18 (06/08 0600) BP: (123-172)/(63-94) 172/94 (06/08 0600) SpO2:  [94 %-100 %] 100 % (06/08 0600) FiO2 (%):  [30 %] 30 % (06/08 0339) Weight:  [70.3 kg (154 lb 15.7 oz)-71.3 kg (157 lb 3 oz)] 71.3 kg (157 lb 3 oz) (06/08 0500) Weight change: -2.2 kg (-4 lb 13.6 oz) Last BM Date: 01/13/17  Intake/Output from previous day: 06/07 0701 - 06/08 0700 In: 1498.8 [I.V.:1158.8; Blood:240; IV Piggyback:100] Out: 400 [Emesis/NG output:400]  PHYSICAL EXAM General appearance: alert and Intubated on mechanical ventilation Resp: rhonchi bilaterally Cardio: regular rate and rhythm, S1, S2 normal, no murmur, click, rub or gallop GI: soft, non-tender; bowel sounds normal; no masses,  no organomegaly Extremities: Her legs are significantly discolored and are wrapped Skin warm and dry. Her throat is clear now  Lab Results:  Results for orders placed or performed during the hospital encounter of 01/12/17 (from the past 48 hour(s))  Glucose, capillary     Status: Abnormal   Collection Time: 01/13/17  8:06 AM  Result Value Ref Range   Glucose-Capillary 125 (H) 65 - 99 mg/dL  Protime-INR     Status: Abnormal   Collection Time: 01/13/17  8:25 AM  Result Value Ref Range   Prothrombin Time 15.7 (H) 11.4 - 15.2 seconds   INR 1.24   Blood gas, arterial     Status: Abnormal   Collection Time: 01/13/17  8:45 AM  Result Value Ref Range    FIO2 100.00    Delivery systems VENTILATOR    Mode PRESSURE REGULATED VOLUME CONTROL    VT 500 mL   LHR 14 resp/min   Peep/cpap 5.0 cm H20   pH, Arterial 7.267 (L) 7.350 - 7.450   pCO2 arterial 31.4 (L) 32.0 - 48.0 mmHg   pO2, Arterial 295 (H) 83.0 - 108.0 mmHg   Bicarbonate 15.3 (L) 20.0 - 28.0 mmol/L   Acid-base deficit 11.7 (H) 0.0 - 2.0 mmol/L   O2 Saturation 98.8 %   Patient temperature 35.9    Collection site LEFT RADIAL    Drawn by [id number]    Sample type ARTERIAL DRAW    Allens test (pass/fail) PASS PASS  Glucose, capillary     Status: Abnormal   Collection Time: 01/13/17 11:32 AM  Result Value Ref Range   Glucose-Capillary 138 (H) 65 - 99 mg/dL  CBC with Differential/Platelet     Status: Abnormal   Collection Time: 01/13/17  4:23 PM  Result Value Ref Range   WBC 14.9 (H) 4.0 - 10.5 K/uL   RBC 2.83 (L) 3.87 - 5.11 MIL/uL   Hemoglobin 8.4 (L) 12.0 - 15.0 g/dL   HCT 25.4 (L) 36.0 - 46.0 %   MCV 89.8 78.0 - 100.0 fL   MCH 29.7 26.0 - 34.0 pg   MCHC 33.1 30.0 - 36.0 g/dL   RDW 17.8 (H) 11.5 - 15.5 %   Platelets 236 150 -  400 K/uL    Comment: DELTA CHECK NOTED   Neutrophils Relative % 87 %   Neutro Abs 12.9 (H) 1.7 - 7.7 K/uL   Lymphocytes Relative 8 %   Lymphs Abs 1.2 0.7 - 4.0 K/uL   Monocytes Relative 4 %   Monocytes Absolute 0.6 0.1 - 1.0 K/uL   Eosinophils Relative 1 %   Eosinophils Absolute 0.2 0.0 - 0.7 K/uL   Basophils Relative 0 %   Basophils Absolute 0.0 0.0 - 0.1 K/uL  Glucose, capillary     Status: None   Collection Time: 01/13/17  4:30 PM  Result Value Ref Range   Glucose-Capillary 98 65 - 99 mg/dL  Glucose, capillary     Status: Abnormal   Collection Time: 01/13/17  8:09 PM  Result Value Ref Range   Glucose-Capillary 108 (H) 65 - 99 mg/dL  Glucose, capillary     Status: Abnormal   Collection Time: 01/14/17 12:13 AM  Result Value Ref Range   Glucose-Capillary 121 (H) 65 - 99 mg/dL  Glucose, capillary     Status: Abnormal   Collection Time:  01/14/17  4:45 AM  Result Value Ref Range   Glucose-Capillary 115 (H) 65 - 99 mg/dL  Glucose, capillary     Status: Abnormal   Collection Time: 01/14/17  7:41 AM  Result Value Ref Range   Glucose-Capillary 115 (H) 65 - 99 mg/dL  CBC with Differential/Platelet     Status: Abnormal   Collection Time: 01/14/17  8:11 AM  Result Value Ref Range   WBC 9.4 4.0 - 10.5 K/uL   RBC 2.09 (L) 3.87 - 5.11 MIL/uL   Hemoglobin 6.2 (LL) 12.0 - 15.0 g/dL    Comment: REPEATED TO VERIFY CRITICAL RESULT CALLED TO, READ BACK BY AND VERIFIED WITH: PHILLIPS,C RN AT 830AM BY HFLYNT 01/14/17    HCT 19.5 (L) 36.0 - 46.0 %   MCV 93.3 78.0 - 100.0 fL   MCH 29.7 26.0 - 34.0 pg   MCHC 31.8 30.0 - 36.0 g/dL   RDW 18.3 (H) 11.5 - 15.5 %   Platelets 110 (L) 150 - 400 K/uL    Comment: PLATELET COUNT CONFIRMED BY SMEAR SPECIMEN CHECKED FOR CLOTS DELTA CHECK NOTED    Neutrophils Relative % 77 %   Neutro Abs 7.3 1.7 - 7.7 K/uL   Lymphocytes Relative 13 %   Lymphs Abs 1.2 0.7 - 4.0 K/uL   Monocytes Relative 9 %   Monocytes Absolute 0.8 0.1 - 1.0 K/uL   Eosinophils Relative 1 %   Eosinophils Absolute 0.1 0.0 - 0.7 K/uL   Basophils Relative 0 %   Basophils Absolute 0.0 0.0 - 0.1 K/uL  Comprehensive metabolic panel     Status: Abnormal   Collection Time: 01/14/17  8:11 AM  Result Value Ref Range   Sodium 145 135 - 145 mmol/L   Potassium 3.9 3.5 - 5.1 mmol/L    Comment: DELTA CHECK NOTED   Chloride 103 101 - 111 mmol/L   CO2 23 22 - 32 mmol/L   Glucose, Bld 125 (H) 65 - 99 mg/dL   BUN 69 (H) 6 - 20 mg/dL   Creatinine, Ser 12.10 (H) 0.44 - 1.00 mg/dL    Comment: DELTA CHECK NOTED   Calcium 9.0 8.9 - 10.3 mg/dL   Total Protein 6.1 (L) 6.5 - 8.1 g/dL   Albumin 2.7 (L) 3.5 - 5.0 g/dL   AST 38 15 - 41 U/L   ALT 18 14 - 54 U/L  Alkaline Phosphatase 88 38 - 126 U/L   Total Bilirubin 1.4 (H) 0.3 - 1.2 mg/dL   GFR calc non Af Amer 3 (L) >60 mL/min   GFR calc Af Amer 4 (L) >60 mL/min    Comment: (NOTE) The  eGFR has been calculated using the CKD EPI equation. This calculation has not been validated in all clinical situations. eGFR's persistently <60 mL/min signify possible Chronic Kidney Disease.    Anion gap 19 (H) 5 - 15  Ammonia     Status: None   Collection Time: 01/14/17  8:11 AM  Result Value Ref Range   Ammonia 27 9 - 35 umol/L  Blood gas, arterial     Status: Abnormal   Collection Time: 01/14/17  9:10 AM  Result Value Ref Range   FIO2 30.00    Delivery systems VENTILATOR    Mode PRESSURE REGULATED VOLUME CONTROL    VT 500 mL   LHR 14 resp/min   Peep/cpap 5.0 cm H20   pH, Arterial 7.481 (H) 7.350 - 7.450   pCO2 arterial 29.5 (L) 32.0 - 48.0 mmHg   pO2, Arterial 127 (H) 83.0 - 108.0 mmHg   Bicarbonate 23.5 20.0 - 28.0 mmol/L   Acid-base deficit 1.3 0.0 - 2.0 mmol/L   O2 Saturation 98.6 %   Patient temperature 37.0    Collection site LEFT RADIAL    Drawn by [id number]    Sample type ARTERIAL DRAW    Allens test (pass/fail) PASS PASS  Glucose, capillary     Status: Abnormal   Collection Time: 01/14/17 11:56 AM  Result Value Ref Range   Glucose-Capillary 121 (H) 65 - 99 mg/dL  Glucose, capillary     Status: Abnormal   Collection Time: 01/14/17  4:26 PM  Result Value Ref Range   Glucose-Capillary 117 (H) 65 - 99 mg/dL  Hemoglobin and hematocrit, blood     Status: Abnormal   Collection Time: 01/14/17  7:48 PM  Result Value Ref Range   Hemoglobin 8.1 (L) 12.0 - 15.0 g/dL    Comment: POST TRANSFUSION SPECIMEN POST TRANSFUSION SPECIMEN DELTA CHECK NOTED    HCT 24.1 (L) 36.0 - 46.0 %  Glucose, capillary     Status: Abnormal   Collection Time: 01/14/17  8:26 PM  Result Value Ref Range   Glucose-Capillary 111 (H) 65 - 99 mg/dL  Glucose, capillary     Status: Abnormal   Collection Time: 01/14/17 11:19 PM  Result Value Ref Range   Glucose-Capillary 123 (H) 65 - 99 mg/dL    ABGS  Recent Labs  01/12/17 1723  01/14/17 0910  PHART  --   < > 7.481*  PO2ART  --   < > 127*   TCO2 17  --   --   HCO3  --   < > 23.5  < > = values in this interval not displayed. CULTURES Recent Results (from the past 240 hour(s))  Wound or Superficial Culture     Status: None (Preliminary result)   Collection Time: 01/12/17  5:50 PM  Result Value Ref Range Status   Specimen Description LEG RIGHT  Final   Special Requests Immunocompromised  Final   Gram Stain   Final    MODERATE WBC PRESENT, PREDOMINANTLY PMN ABUNDANT GRAM NEGATIVE RODS    Culture   Final    ABUNDANT KLEBSIELLA OXYTOCA SUSCEPTIBILITIES TO FOLLOW Performed at [facility], [address], [place], [place] [address]    Report Status PENDING  Incomplete  Culture, blood (Routine X 2) w Reflex to ID Panel     Status: Abnormal (Preliminary result)   Collection Time: 01/12/17  6:24 PM  Result Value Ref Range Status   Specimen Description BLOOD LEFT HAND  Final   Special Requests Blood Culture adequate volume  Final   Culture  Setup Time   Final    GRAM POSITIVE COCCI IN CLUSTERS Gram Stain Report Called to,Read Back By and Verified With: MOTLEY J. AT 1320 BY THOMPSON S. ON [id number] CRITICAL RESULT CALLED TO, READ BACK BY AND VERIFIED WITH: J. MOTLEY, RN (APH) AT 1808 ON 01/13/17 BY C. JESSUP, MLT.    Culture (A)  Final    STAPHYLOCOCCUS SPECIES (COAGULASE NEGATIVE) THE SIGNIFICANCE OF ISOLATING THIS ORGANISM FROM A SINGLE SET OF BLOOD CULTURES WHEN MULTIPLE SETS ARE DRAWN IS UNCERTAIN. PLEASE NOTIFY THE MICROBIOLOGY DEPARTMENT WITHIN ONE WEEK IF SPECIATION AND SENSITIVITIES ARE REQUIRED. Performed at [facility], [place] [address] [place], [place] [address]    Report Status PENDING  Incomplete  Blood Culture ID Panel (Reflexed)     Status: Abnormal   Collection Time: 01/12/17  6:24 PM  Result Value Ref Range Status   Enterococcus species NOT DETECTED NOT DETECTED Final   Listeria monocytogenes NOT DETECTED NOT DETECTED Final   Staphylococcus species DETECTED (A) NOT DETECTED Final    Comment:  Methicillin (oxacillin) resistant coagulase negative staphylococcus. Possible blood culture contaminant (unless isolated from more than one blood culture draw or clinical case suggests pathogenicity). No antibiotic treatment is indicated for blood  culture contaminants. CRITICAL RESULT CALLED TO, READ BACK BY AND VERIFIED WITH: J. MOTLEY, RN (APH) ON AT 1808 ON 01/13/17 BY C. JESSUP, MLT.    Staphylococcus aureus NOT DETECTED NOT DETECTED Final   Methicillin resistance DETECTED (A) NOT DETECTED Final    Comment: CRITICAL RESULT CALLED TO, READ BACK BY AND VERIFIED WITH: J. MOTLEY, RN (APH) AT 1808 ON 01/13/17 BY C. JESSUP, MLT.    Streptococcus species NOT DETECTED NOT DETECTED Final   Streptococcus agalactiae NOT DETECTED NOT DETECTED Final   Streptococcus pneumoniae NOT DETECTED NOT DETECTED Final   Streptococcus pyogenes NOT DETECTED NOT DETECTED Final   Acinetobacter baumannii NOT DETECTED NOT DETECTED Final   Enterobacteriaceae species NOT DETECTED NOT DETECTED Final   Enterobacter cloacae complex NOT DETECTED NOT DETECTED Final   Escherichia coli NOT DETECTED NOT DETECTED Final   Klebsiella oxytoca NOT DETECTED NOT DETECTED Final   Klebsiella pneumoniae NOT DETECTED NOT DETECTED Final   Proteus species NOT DETECTED NOT DETECTED Final   Serratia marcescens NOT DETECTED NOT DETECTED Final   Haemophilus influenzae NOT DETECTED NOT DETECTED Final   Neisseria meningitidis NOT DETECTED NOT DETECTED Final   Pseudomonas aeruginosa NOT DETECTED NOT DETECTED Final   Candida albicans NOT DETECTED NOT DETECTED Final   Candida glabrata NOT DETECTED NOT DETECTED Final   Candida krusei NOT DETECTED NOT DETECTED Final   Candida parapsilosis NOT DETECTED NOT DETECTED Final   Candida tropicalis NOT DETECTED NOT DETECTED Final    Comment: Performed at [facility], [place] [address], [place], [place] [address]  MRSA PCR Screening     Status: None   Collection Time: 01/13/17 12:09 AM  Result Value  Ref Range Status   MRSA by PCR NEGATIVE NEGATIVE Final    Comment:        The GeneXpert MRSA Assay (FDA approved for NASAL specimens only), is one component of a comprehensive MRSA colonization surveillance program.  It is not intended to diagnose MRSA infection nor to guide or monitor treatment for MRSA infections.   Culture, blood (Routine X 2) w Reflex to ID Panel     Status: None (Preliminary result)   Collection Time: 01/13/17  4:47 AM  Result Value Ref Range Status   Specimen Description BLOOD PICC LINE  Final   Special Requests   Final    BOTTLES DRAWN AEROBIC AND ANAEROBIC Blood Culture results may not be optimal due to an excessive volume of blood received in culture bottles AEROBIC BOTTLE Blood Culture results may not be optimal due to an inadequate volume of blood received in culture  bottles ANAEROBIC BOTTLE    Culture NO GROWTH 1 DAY  Final   Report Status PENDING  Incomplete   Studies/Results: Ct Head Wo Contrast  Result Date: 01/13/2017 CLINICAL DATA:  Altered mental status.  Ventilator dependence. EXAM: CT HEAD WITHOUT CONTRAST TECHNIQUE: Contiguous axial images were obtained from the base of the skull through the vertex without intravenous contrast. COMPARISON:  09/29/2016 FINDINGS: Brain: There is no evidence for acute hemorrhage, hydrocephalus, mass lesion, or abnormal extra-axial fluid collection. No definite CT evidence for acute infarction. Vascular: No hyperdense vessel or unexpected calcification. Skull: No evidence for fracture. No worrisome lytic or sclerotic lesion. Sinuses/Orbits: Mucosal disease in the ethmoid air cells and maxillary sinus has progressed in the interval. Visualized portions of the globes and intraorbital fat are unremarkable. Other: None. IMPRESSION: 1. No acute intracranial abnormality. 2. Interval development of paranasal sinus disease, nonspecific in the setting of intubation. Electronically Signed   By: Misty Stanley M.D.   On: 01/13/2017  11:33   Dg Chest Port 1 View  Result Date: 01/14/2017 CLINICAL DATA:  Respiratory failure EXAM: PORTABLE CHEST 1 VIEW COMPARISON:  01/13/2017 FINDINGS: ET tube tip is above the carina. There is a left arm PICC line with tip in the cavoatrial junction. OG tube tip is in the stomach. Aortic atherosclerosis. Normal heart size. Decrease in left pleural effusion and pulmonary vascular congestion. IMPRESSION: 1. Decrease in left pleural effusion and pulmonary vascular congestion. Electronically Signed   By: Kerby Moors M.D.   On: 01/14/2017 09:01   Dg Chest Port 1 View  Result Date: 01/13/2017 CLINICAL DATA:  Status post orogastric tube placement. EXAM: PORTABLE CHEST 1 VIEW COMPARISON:  Chest x-ray of earlier today. FINDINGS: The orogastric tube tip projects at approximately the level of the pylorus or proximal duodenum. The proximal port projects in the distal gastric body. There are loops of mildly distended gas-filled small bowel in the left mid abdomen. IMPRESSION: Reasonable positioning of the orogastric tube. Electronically Signed   By: David  Martinique M.D.   On: 01/13/2017 09:13    Medications:  Prior to Admission:  Prescriptions Prior to Admission  Medication Sig Dispense Refill Last Dose  . augmented betamethasone dipropionate (DIPROLENE-AF) 0.05 % cream APPLY TO THE AFFECTED AREAS ON LEGS TWICE DAILY AS NEEDED (NOT FACE, GROIN, OR UNDER ARMS)  3 unknown  . clotrimazole (LOTRIMIN) 1 % cream Apply 1 application topically 3 (three) times daily. 12 g 1 unknown  . lidocaine (XYLOCAINE) 2 % solution TAKE TWO TEASPOONSFUL (10ML) BY MOUTH BEFORE MEALS AND AT BEDTIME TO PREVENT CHEST PAIN WHILE EATING 100 mL 1 unknown  . LORazepam (ATIVAN) 1 MG tablet Take 1 mg by mouth 2 (two) times daily as needed for anxiety (and/or back spasms).   unknown  . midodrine (PROAMATINE) 5 MG tablet TAKE TWO (2) TABLETS BY MOUTH  DAILY. TAKE ONLY ON DIALYSIS DAYS ON TUESDAYS, THURDAYS, AND SATURDAYS (SOMETIME TAKES  TREATMENTS ON FRIDAY IN  3 unknown  . Oxycodone HCl 10 MG TABS Take 0.5 tablets (5 mg total) by mouth 3 (three) times daily as needed. 1 PO TID AS NEEDED FOR PAIN (Patient taking differently: Take 5-10 mg by mouth 3 (three) times daily as needed. ) 1 tablet 0 unknown  . Darbepoetin Alfa (ARANESP) 60 MCG/0.3ML SOSY injection Inject 0.3 mLs (60 mcg total) into the vein every Thursday with hemodialysis. (Patient taking differently: Inject 60 mcg into the vein every Tuesday with hemodialysis. ) 4.2 mL  12/22/2016 at Unknown time  . lactulose (CHRONULAC) 10 GM/15ML solution Take 15-30 mLs (10-20 g total) by mouth 3 (three) times daily. TAKE 15-30 ml BY MOUTH three times a day   12/22/2016 at Unknown time  . lidocaine-prilocaine (EMLA) cream Apply 1 application topically as needed (for dialysis treatments).   Not Taking at Unknown time  . midodrine (PROAMATINE) 10 MG tablet Take 1 tablet (10 mg total) by mouth 3 (three) times daily.   12/22/2016 at Unknown time  . omeprazole (PRILOSEC) 20 MG capsule 1 po every morning 30 minutes prior to your first meal. (Patient taking differently: Take 20 mg by mouth daily before breakfast. 1 po every morning 30 minutes prior to your first meal.) 31 capsule 11 12/21/2016 at Unknown time  . RENVELA 800 MG tablet Take 1600 mg by mouth 3 times daily with meals. Take 800 mg by mouth with snacks.  5 12/21/2016 at Unknown time  . sucralfate (CARAFATE) 1 GM/10ML suspension Take 1 g by mouth 4 (four) times daily -  with meals and at bedtime.   12/21/2016 at Unknown time   Scheduled: . chlorhexidine gluconate (MEDLINE KIT)  15 mL Mouth Rinse BID  . Chlorhexidine Gluconate Cloth  6 each Topical Daily  . epoetin (EPOGEN/PROCRIT) injection  10,000 Units Subcutaneous Q M,W,F-HD  . fentaNYL (SUBLIMAZE) injection  50 mcg Intravenous Once  . lactulose  30 g Per Tube Q6H  . mouth rinse  15 mL Mouth Rinse QID  . pantoprazole (PROTONIX) IV  40 mg Intravenous BID AC  . sodium chloride flush   10-40 mL Intracatheter Q12H  . sodium chloride flush  3 mL Intravenous Q12H   Continuous: . sodium chloride Stopped (01/13/17 0948)  . sodium chloride    . sodium chloride    . fentaNYL infusion INTRAVENOUS 400 mcg/hr (01/15/17 0657)  . piperacillin-tazobactam (ZOSYN)  IV Stopped (01/15/17 0157)  . vancomycin     QVZ:DGLOVF chloride, sodium chloride, alteplase, fentaNYL, heparin, lidocaine (PF), lidocaine-prilocaine, ondansetron **OR** ondansetron (ZOFRAN) IV, pentafluoroprop-tetrafluoroeth, sodium chloride flush  Assesment:she was admitted with altered mental status related to uremia and liver failure. Her creatinine was over 20. She has had dialysis several times during this hospitalization. Her ammonia level was greater than 300 but was back to normal yesterday. She is receiving lactulose per her G-tube. She started having significant bleeding from multiple areas and is known to be thrombocytopenic and she was intubated for airway protection. She is being treated for aspiration pneumonia but chest x-ray really looks pretty good. She will remain on the Zosyn for cellulitis of her legs.  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 Attempt weaning today.    LOS:  3 days   Nur Krasinski L 01/15/2017, 7:20 AM

## 2017-01-15 NOTE — Procedures (Signed)
PreOperative Dx: Cirrhosis due to NASH, ascites Postoperative Dx: Cirrhosis due to NASH, ascites Procedure:   US guided paracentesis Radiologist:  Thornton Papas Anesthesia:  10 ml of1% lidocaine Specimen:  4.9 L of yellow ascitic fluid EBL:   < 1 ml Complications: None

## 2017-01-15 NOTE — Progress Notes (Signed)
During assessment, abscess like nodule noted to left buttock, white head noted. Hard, non-draining at this time. Will continue to monitor

## 2017-01-16 ENCOUNTER — Inpatient Hospital Stay (HOSPITAL_COMMUNITY): Payer: Medicare Other

## 2017-01-16 DIAGNOSIS — R188 Other ascites: Secondary | ICD-10-CM

## 2017-01-16 LAB — CBC WITH DIFFERENTIAL/PLATELET
BASOS ABS: 0 10*3/uL (ref 0.0–0.1)
Basophils Relative: 0 %
Eosinophils Absolute: 0.5 10*3/uL (ref 0.0–0.7)
Eosinophils Relative: 6 %
HCT: 20.9 % — ABNORMAL LOW (ref 36.0–46.0)
Hemoglobin: 6.7 g/dL — CL (ref 12.0–15.0)
Lymphocytes Relative: 13 %
Lymphs Abs: 1.2 10*3/uL (ref 0.7–4.0)
MCH: 30 pg (ref 26.0–34.0)
MCHC: 32.1 g/dL (ref 30.0–36.0)
MCV: 93.7 fL (ref 78.0–100.0)
MONOS PCT: 7 %
Monocytes Absolute: 0.6 10*3/uL (ref 0.1–1.0)
NEUTROS ABS: 6.6 10*3/uL (ref 1.7–7.7)
Neutrophils Relative %: 74 %
PLATELETS: 64 10*3/uL — AB (ref 150–400)
RBC: 2.23 MIL/uL — ABNORMAL LOW (ref 3.87–5.11)
RDW: 17.8 % — AB (ref 11.5–15.5)
WBC: 8.9 10*3/uL (ref 4.0–10.5)

## 2017-01-16 LAB — BASIC METABOLIC PANEL
ANION GAP: 15 (ref 5–15)
BUN: 35 mg/dL — AB (ref 6–20)
CALCIUM: 8.8 mg/dL — AB (ref 8.9–10.3)
CO2: 26 mmol/L (ref 22–32)
Chloride: 96 mmol/L — ABNORMAL LOW (ref 101–111)
Creatinine, Ser: 7.4 mg/dL — ABNORMAL HIGH (ref 0.44–1.00)
GFR calc Af Amer: 7 mL/min — ABNORMAL LOW (ref >60)
GFR, EST NON AFRICAN AMERICAN: 6 mL/min — AB (ref >60)
GLUCOSE: 82 mg/dL (ref 65–99)
Potassium: 3.3 mmol/L — ABNORMAL LOW (ref 3.5–5.1)
Sodium: 137 mmol/L (ref 135–145)

## 2017-01-16 LAB — PREPARE RBC (CROSSMATCH)

## 2017-01-16 LAB — GLUCOSE, CAPILLARY
GLUCOSE-CAPILLARY: 75 mg/dL (ref 65–99)
GLUCOSE-CAPILLARY: 85 mg/dL (ref 65–99)
Glucose-Capillary: 82 mg/dL (ref 65–99)
Glucose-Capillary: 80 mg/dL (ref 65–99)

## 2017-01-16 MED ORDER — PANTOPRAZOLE SODIUM 40 MG PO TBEC
40.0000 mg | DELAYED_RELEASE_TABLET | Freq: Every day | ORAL | Status: DC
  Administered 2017-01-16 – 2017-01-19 (×4): 40 mg via ORAL
  Filled 2017-01-16 (×4): qty 1

## 2017-01-16 MED ORDER — SODIUM CHLORIDE 0.9 % IV SOLN
Freq: Once | INTRAVENOUS | Status: AC
  Administered 2017-01-16: 13:00:00 via INTRAVENOUS

## 2017-01-16 MED ORDER — DEXTROSE 5 % IV SOLN
2.0000 g | INTRAVENOUS | Status: DC
  Administered 2017-01-16 – 2017-01-19 (×4): 2 g via INTRAVENOUS
  Filled 2017-01-16 (×5): qty 2

## 2017-01-16 NOTE — Progress Notes (Signed)
Wanda [name]  MRN: [id number]  DOB/AGE: 1976-05-16 41 y.o.  Primary Care Physician:Roberson, [name], MD  Admit date: 01/12/2017  Chief Complaint:  Chief Complaint  Patient presents with  . Altered Mental Status    S-Pt presented on  01/12/2017 with  Chief Complaint  Patient presents with  . Altered Mental Status  .     Pt offers no new  complaints .    Pt says" I am much better than before"   Meds . chlorhexidine gluconate (MEDLINE KIT)  15 mL Mouth Rinse BID  . Chlorhexidine Gluconate Cloth  6 each Topical Daily  . epoetin (EPOGEN/PROCRIT) injection  10,000 Units Subcutaneous Q M,W,F-HD  . lactulose  30 g Per Tube TID  . mouth rinse  15 mL Mouth Rinse QID  . [START ON 01/18/2017] midodrine  10 mg Oral Q M,W,F-HD  . pantoprazole  40 mg Oral Q0600  . sodium chloride flush  10-40 mL Intracatheter Q12H  . sodium chloride flush  3 mL Intravenous Q12H     Physical Exam: Vital signs in last 24 hours: Temp:  [92.5 F (33.6 C)-99 F (37.2 C)] 98.4 F (36.9 C) (06/09 1430) Pulse Rate:  [88-129] 101 (06/09 1430) Resp:  [7-33] 13 (06/09 1430) BP: (104-168)/(50-123) 133/71 (06/09 1430) SpO2:  [78 %-100 %] 99 % (06/09 1430) Weight:  [139 lb 12.4 oz (63.4 kg)] 139 lb 12.4 oz (63.4 kg) (06/09 0400) Weight change: -6 lb 9.8 oz (-3 kg) Last BM Date: 01/16/17  Intake/Output from previous day: 06/08 0701 - 06/09 0700 In: 580 [P.O.:150; I.V.:180; IV Piggyback:250] Out: 2000  Total I/O In: 56 [P.O.:60; Blood:335; IV Piggyback:50] Out: -    Physical Exam: General- pt is awake, follow commands Resp- No distress, decreased at bases CVS- S1S2 regular in rate and rhythm GIT- BS+, soft, umbilical hernia present, distention is better.  EXT- 1+ LE Edema,NO Cyanosis. Chronic venous stasis changes present. Access-  AVF+  Lab Results: CBC  Recent Labs  01/15/17 0800 01/16/17 0514  WBC 10.1 8.9  HGB 7.7* 6.7*  HCT 23.3* 20.9*  PLT 95* 64*    BMET  Recent Labs  01/15/17 0800 01/16/17 0514  NA 142 137  K 4.0 3.3*  CL 103 96*  CO2 21* 26  GLUCOSE 108* 82  BUN 77* 35*  CREATININE 12.97* 7.40*  CALCIUM 9.0 8.8*    MICRO Recent Results (from the past 240 hour(s))  Wound or Superficial Culture     Status: None   Collection Time: 01/12/17  5:50 PM  Result Value Ref Range Status   Specimen Description LEG RIGHT  Final   Special Requests Immunocompromised  Final   Gram Stain   Final    MODERATE WBC PRESENT, PREDOMINANTLY PMN ABUNDANT GRAM NEGATIVE RODS Performed at [facility], [place] [address], [place], [place] [address]    Culture ABUNDANT KLEBSIELLA OXYTOCA  Final   Report Status 01/15/2017 FINAL  Final   Organism ID, Bacteria KLEBSIELLA OXYTOCA  Final      Susceptibility   Klebsiella oxytoca - MIC*    AMPICILLIN RESISTANT Resistant     CEFAZOLIN >=64 RESISTANT Resistant     CEFEPIME <=1 SENSITIVE Sensitive     CEFTAZIDIME <=1 SENSITIVE Sensitive     CEFTRIAXONE <=1 SENSITIVE Sensitive     CIPROFLOXACIN <=0.25 SENSITIVE Sensitive     GENTAMICIN <=1 SENSITIVE Sensitive     IMIPENEM <=0.25 SENSITIVE Sensitive     TRIMETH/SULFA <=20 SENSITIVE Sensitive  AMPICILLIN/SULBACTAM 8 SENSITIVE Sensitive     PIP/TAZO <=4 SENSITIVE Sensitive     Extended ESBL NEGATIVE Sensitive     * ABUNDANT KLEBSIELLA OXYTOCA  Culture, blood (Routine X 2) w Reflex to ID Panel     Status: Abnormal   Collection Time: 01/12/17  6:24 PM  Result Value Ref Range Status   Specimen Description BLOOD LEFT HAND  Final   Special Requests Blood Culture adequate volume  Final   Culture  Setup Time   Final    GRAM POSITIVE COCCI IN CLUSTERS Gram Stain Report Called to,Read Back By and Verified With: MOTLEY J. AT 1320 BY THOMPSON S. ON [id number] CRITICAL RESULT CALLED TO, READ BACK BY AND VERIFIED WITH: J. MOTLEY, RN (APH) AT 1808 ON 01/13/17 BY C. JESSUP, MLT.    Culture (A)  Final    STAPHYLOCOCCUS SPECIES (COAGULASE NEGATIVE) THE SIGNIFICANCE OF ISOLATING THIS  ORGANISM FROM A SINGLE SET OF BLOOD CULTURES WHEN MULTIPLE SETS ARE DRAWN IS UNCERTAIN. PLEASE NOTIFY THE MICROBIOLOGY DEPARTMENT WITHIN ONE WEEK IF SPECIATION AND SENSITIVITIES ARE REQUIRED. Performed at [facility], [place] [address] [place], [place] [address]    Report Status 01/15/2017 FINAL  Final  Blood Culture ID Panel (Reflexed)     Status: Abnormal   Collection Time: 01/12/17  6:24 PM  Result Value Ref Range Status   Enterococcus species NOT DETECTED NOT DETECTED Final   Listeria monocytogenes NOT DETECTED NOT DETECTED Final   Staphylococcus species DETECTED (A) NOT DETECTED Final    Comment: Methicillin (oxacillin) resistant coagulase negative staphylococcus. Possible blood culture contaminant (unless isolated from more than one blood culture draw or clinical case suggests pathogenicity). No antibiotic treatment is indicated for blood  culture contaminants. CRITICAL RESULT CALLED TO, READ BACK BY AND VERIFIED WITH: J. MOTLEY, RN (APH) ON AT 1808 ON 01/13/17 BY C. JESSUP, MLT.    Staphylococcus aureus NOT DETECTED NOT DETECTED Final   Methicillin resistance DETECTED (A) NOT DETECTED Final    Comment: CRITICAL RESULT CALLED TO, READ BACK BY AND VERIFIED WITH: J. MOTLEY, RN (APH) AT 1808 ON 01/13/17 BY C. JESSUP, MLT.    Streptococcus species NOT DETECTED NOT DETECTED Final   Streptococcus agalactiae NOT DETECTED NOT DETECTED Final   Streptococcus pneumoniae NOT DETECTED NOT DETECTED Final   Streptococcus pyogenes NOT DETECTED NOT DETECTED Final   Acinetobacter baumannii NOT DETECTED NOT DETECTED Final   Enterobacteriaceae species NOT DETECTED NOT DETECTED Final   Enterobacter cloacae complex NOT DETECTED NOT DETECTED Final   Escherichia coli NOT DETECTED NOT DETECTED Final   Klebsiella oxytoca NOT DETECTED NOT DETECTED Final   Klebsiella pneumoniae NOT DETECTED NOT DETECTED Final   Proteus species NOT DETECTED NOT DETECTED Final   Serratia marcescens NOT DETECTED NOT DETECTED  Final   Haemophilus influenzae NOT DETECTED NOT DETECTED Final   Neisseria meningitidis NOT DETECTED NOT DETECTED Final   Pseudomonas aeruginosa NOT DETECTED NOT DETECTED Final   Candida albicans NOT DETECTED NOT DETECTED Final   Candida glabrata NOT DETECTED NOT DETECTED Final   Candida krusei NOT DETECTED NOT DETECTED Final   Candida parapsilosis NOT DETECTED NOT DETECTED Final   Candida tropicalis NOT DETECTED NOT DETECTED Final    Comment: Performed at [facility], [place] [address] [place], [place] [address]  MRSA PCR Screening     Status: None   Collection Time: 01/13/17 12:09 AM  Result Value Ref Range Status   MRSA by PCR NEGATIVE NEGATIVE Final  Comment:        The GeneXpert MRSA Assay (FDA approved for NASAL specimens only), is one component of a comprehensive MRSA colonization surveillance program. It is not intended to diagnose MRSA infection nor to guide or monitor treatment for MRSA infections.   Culture, blood (Routine X 2) w Reflex to ID Panel     Status: None (Preliminary result)   Collection Time: 01/13/17  4:47 AM  Result Value Ref Range Status   Specimen Description BLOOD PICC LINE  Final   Special Requests   Final    BOTTLES DRAWN AEROBIC AND ANAEROBIC Blood Culture results may not be optimal due to an excessive volume of blood received in culture bottles AEROBIC BOTTLE Blood Culture results may not be optimal due to an inadequate volume of blood received in culture  bottles ANAEROBIC BOTTLE    Culture NO GROWTH 3 DAYS  Final   Report Status PENDING  Incomplete      Lab Results  Component Value Date   CALCIUM 8.8 (L) 01/16/2017   CAION 0.86 (LL) 01/12/2017   PHOS 4.5 12/21/2016       Impression: 1)Renal   ESRD on HD                 Pt is on TTS schedule                 Pt had strong hx of non adhrenece to tx                 Pt had HD yesterday                 Pt missed 2 weeks of tx, will dialyze in am.   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

## 2017-01-16 NOTE — Progress Notes (Signed)
Patient ID: Wanda [name], female   DOB: Sep 04, 1975, 41 y.o.   MRN: [id number]   Assessment/Plan: ADMITTED WITH MS CHANGES/UREMIA/CELLULITIS/EPISTAXIS. CLINICALLY IMPROVED.  PLAN: 1. HOLD LACTULOSE. WILL ASSES DAILY. GIVE 7.5 ML IF NEEDED 2.PT CONCERNED ABOUT C DIFF. EXPLAINED SHE NEEDS ROCEPHIN FOR SKIN INFECTION. 3. PAIN CONTROL 4. SUPPORTIVE CARE   Subjective: Since I last evaluated the patient SHE IS TOLERATING POs. DOESN'T WANT LACTULOSE BECAUSE OF LOOSE STOOLS TAKES 7.5 MLS AT HOME. NO BRBPR OR MELENA.  Objective: Vital signs in last 24 hours: Vitals:   01/16/17 0800 01/16/17 0900  BP: (!) 156/110 134/76  Pulse: 99 98  Resp:    Temp: 97.9 F (36.6 C) 98.1 F (36.7 C)   General appearance: alert, cooperative and no distress Resp: clear to auscultation bilaterally Cardio: regular rate and rhythm GI: soft,  bowel sounds normal; tender-MILD TTP IN LEFT PERIUMBILICAL REGION  Lab Results: K 3.3 Hb 6.7 PLT CT 64    Studies/Results: US Paracentesis  Result Date: 01/15/2017 INDICATION: Cirrhosis, ascites EXAM: ULTRASOUND GUIDED THERAPEUTIC PARACENTESIS MEDICATIONS: None. COMPLICATIONS: None immediate. PROCEDURE: Procedure, benefits, and risks of procedure were discussed with patient. Written informed consent for procedure was obtained. Time out protocol followed. Adequate collection of ascites localized by ultrasound in RIGHT lower quadrant. Skin prepped and draped in usual sterile fashion. Skin and soft tissues anesthetized with 10 mL of 1% lidocaine. 5 [place] Yueh catheter placed into peritoneal cavity. 4.9 L of yellow colored ascitic fluid aspirated by vacuum bottle suction. Procedure tolerated well by patient without immediate complication. FINDINGS: As above IMPRESSION: Successful ultrasound-guided paracentesis yielding 4.9 liters of peritoneal fluid. Electronically Signed   By: Lavonia Dana M.D.   On: 01/15/2017 14:53   Dg Chest Port 1 View  Result Date:  01/16/2017 CLINICAL DATA:  Respiratory failure EXAM: PORTABLE CHEST 1 VIEW COMPARISON:  January 15, 2017 FINDINGS: Endotracheal tube and nasogastric tube have been removed. No pneumothorax. Central catheter tip is in the superior vena cava, unchanged. There is mild left base atelectasis. There is trace interstitial edema. No consolidation. There is cardiomegaly with pulmonary venous hypertension. No adenopathy. No bone lesions. There is aortic atherosclerosis. IMPRESSION: Central catheter tip in superior vena cava. No pneumothorax. Evidence of a degree of congestive heart failure with mild interstitial edema. Stable cardiac silhouette. Mild left base atelectasis noted. There is aortic atherosclerosis. Electronically Signed   By: Lowella Grip III M.D.   On: 01/16/2017 07:52    Medications: I have reviewed the patient's current medications.   LOS: 5 days   Barney Drain 01/18/2014, 2:23 PM

## 2017-01-16 NOTE — Progress Notes (Signed)
Subjective: She is much more alert. She is able to talk to me today. She has no new complaints. She wanted to know about her blood testing so I told her what it showed. She was extubated yesterday successfully and has done well  Objective: Vital signs in last 24 hours: Temp:  [92.5 F (33.6 C)-99.1 F (37.3 C)] 97.5 F (36.4 C) (06/09 0727) Pulse Rate:  [88-129] 94 (06/09 0727) Resp:  [7-33] 24 (06/09 0727) BP: (104-168)/(50-123) 126/70 (06/09 0700) SpO2:  [78 %-100 %] 100 % (06/09 0727) Weight:  [63.4 kg (139 lb 12.4 oz)-67.3 kg (148 lb 5.9 oz)] 63.4 kg (139 lb 12.4 oz) (06/09 0400) Weight change: -3 kg (-6 lb 9.8 oz) Last BM Date: 01/15/17  Intake/Output from previous day: 06/08 0701 - 06/09 0700 In: 580 [P.O.:150; I.V.:180; IV Piggyback:250] Out: 2000   PHYSICAL EXAM General appearance: alert, cooperative and no distress Resp: rhonchi bilaterally Cardio: regular rate and rhythm, S1, S2 normal, no murmur, click, rub or gallop GI: soft, non-tender; bowel sounds normal; no masses,  no organomegaly Extremities: extremities normal, atraumatic, no cyanosis or edema Skin warm and dry. Mucous membranes are moist  Lab Results:  Results for orders placed or performed during the hospital encounter of 01/12/17 (from the past 48 hour(s))  Blood gas, arterial     Status: Abnormal   Collection Time: 01/14/17  9:10 AM  Result Value Ref Range   FIO2 30.00    Delivery systems VENTILATOR    Mode PRESSURE REGULATED VOLUME CONTROL    VT 500 mL   LHR 14 resp/min   Peep/cpap 5.0 cm H20   pH, Arterial 7.481 (H) 7.350 - 7.450   pCO2 arterial 29.5 (L) 32.0 - 48.0 mmHg   pO2, Arterial 127 (H) 83.0 - 108.0 mmHg   Bicarbonate 23.5 20.0 - 28.0 mmol/L   Acid-base deficit 1.3 0.0 - 2.0 mmol/L   O2 Saturation 98.6 %   Patient temperature 37.0    Collection site LEFT RADIAL    Drawn by [id number]    Sample type ARTERIAL DRAW    Allens test (pass/fail) PASS PASS  Glucose, capillary     Status:  Abnormal   Collection Time: 01/14/17 11:56 AM  Result Value Ref Range   Glucose-Capillary 121 (H) 65 - 99 mg/dL  Glucose, capillary     Status: Abnormal   Collection Time: 01/14/17  4:26 PM  Result Value Ref Range   Glucose-Capillary 117 (H) 65 - 99 mg/dL  Hemoglobin and hematocrit, blood     Status: Abnormal   Collection Time: 01/14/17  7:48 PM  Result Value Ref Range   Hemoglobin 8.1 (L) 12.0 - 15.0 g/dL    Comment: POST TRANSFUSION SPECIMEN POST TRANSFUSION SPECIMEN DELTA CHECK NOTED    HCT 24.1 (L) 36.0 - 46.0 %  Glucose, capillary     Status: Abnormal   Collection Time: 01/14/17  8:26 PM  Result Value Ref Range   Glucose-Capillary 111 (H) 65 - 99 mg/dL  Glucose, capillary     Status: Abnormal   Collection Time: 01/14/17 11:19 PM  Result Value Ref Range   Glucose-Capillary 123 (H) 65 - 99 mg/dL  Glucose, capillary     Status: Abnormal   Collection Time: 01/15/17  7:42 AM  Result Value Ref Range   Glucose-Capillary 112 (H) 65 - 99 mg/dL   Comment 1 Notify RN    Comment 2 Document in Chart   CBC with Differential/Platelet     Status: Abnormal  Collection Time: 01/15/17  8:00 AM  Result Value Ref Range   WBC 10.1 4.0 - 10.5 K/uL   RBC 2.56 (L) 3.87 - 5.11 MIL/uL   Hemoglobin 7.7 (L) 12.0 - 15.0 g/dL   HCT 23.3 (L) 36.0 - 46.0 %   MCV 91.0 78.0 - 100.0 fL   MCH 30.1 26.0 - 34.0 pg   MCHC 33.0 30.0 - 36.0 g/dL   RDW 18.1 (H) 11.5 - 15.5 %   Platelets 95 (L) 150 - 400 K/uL    Comment: PLATELET COUNT CONFIRMED BY SMEAR SPECIMEN CHECKED FOR CLOTS    Neutrophils Relative % 76 %   Neutro Abs 7.6 1.7 - 7.7 K/uL   Lymphocytes Relative 11 %   Lymphs Abs 1.1 0.7 - 4.0 K/uL   Monocytes Relative 9 %   Monocytes Absolute 0.9 0.1 - 1.0 K/uL   Eosinophils Relative 3 %   Eosinophils Absolute 0.3 0.0 - 0.7 K/uL   Basophils Relative 1 %   Basophils Absolute 0.1 0.0 - 0.1 K/uL  Basic metabolic panel     Status: Abnormal   Collection Time: 01/15/17  8:00 AM  Result Value Ref  Range   Sodium 142 135 - 145 mmol/L   Potassium 4.0 3.5 - 5.1 mmol/L   Chloride 103 101 - 111 mmol/L   CO2 21 (L) 22 - 32 mmol/L   Glucose, Bld 108 (H) 65 - 99 mg/dL   BUN 77 (H) 6 - 20 mg/dL   Creatinine, Ser 12.97 (H) 0.44 - 1.00 mg/dL   Calcium 9.0 8.9 - 10.3 mg/dL   GFR calc non Af Amer 3 (L) >60 mL/min   GFR calc Af Amer 4 (L) >60 mL/min    Comment: (NOTE) The eGFR has been calculated using the CKD EPI equation. This calculation has not been validated in all clinical situations. eGFR's persistently <60 mL/min signify possible Chronic Kidney Disease.    Anion gap 18 (H) 5 - 15  Glucose, capillary     Status: Abnormal   Collection Time: 01/15/17 11:35 AM  Result Value Ref Range   Glucose-Capillary 103 (H) 65 - 99 mg/dL   Comment 1 Notify RN    Comment 2 Document in Chart   Glucose, capillary     Status: None   Collection Time: 01/15/17  3:44 PM  Result Value Ref Range   Glucose-Capillary 84 65 - 99 mg/dL  Glucose, capillary     Status: None   Collection Time: 01/15/17  8:15 PM  Result Value Ref Range   Glucose-Capillary 86 65 - 99 mg/dL   Comment 1 Notify RN    Comment 2 Document in Chart   Glucose, capillary     Status: None   Collection Time: 01/15/17 11:47 PM  Result Value Ref Range   Glucose-Capillary 81 65 - 99 mg/dL   Comment 1 Notify RN    Comment 2 Document in Chart   Glucose, capillary     Status: None   Collection Time: 01/16/17  4:22 AM  Result Value Ref Range   Glucose-Capillary 82 65 - 99 mg/dL  CBC with Differential/Platelet     Status: Abnormal   Collection Time: 01/16/17  5:14 AM  Result Value Ref Range   WBC 8.9 4.0 - 10.5 K/uL   RBC 2.23 (L) 3.87 - 5.11 MIL/uL   Hemoglobin 6.7 (LL) 12.0 - 15.0 g/dL    Comment: RESULT REPEATED AND VERIFIED CRITICAL RESULT CALLED TO, READ BACK BY AND VERIFIED WITH: HEARN,J AT  6606 BY HUFFINES,S ON 01/16/17.    HCT 20.9 (L) 36.0 - 46.0 %   MCV 93.7 78.0 - 100.0 fL   MCH 30.0 26.0 - 34.0 pg   MCHC 32.1 30.0 -  36.0 g/dL   RDW 17.8 (H) 11.5 - 15.5 %   Platelets 64 (L) 150 - 400 K/uL    Comment: SPECIMEN CHECKED FOR CLOTS   Neutrophils Relative % 74 %   Lymphocytes Relative 13 %   Monocytes Relative 7 %   Eosinophils Relative 6 %   Basophils Relative 0 %   Neutro Abs 6.6 1.7 - 7.7 K/uL   Lymphs Abs 1.2 0.7 - 4.0 K/uL   Monocytes Absolute 0.6 0.1 - 1.0 K/uL   Eosinophils Absolute 0.5 0.0 - 0.7 K/uL   Basophils Absolute 0.0 0.0 - 0.1 K/uL  Basic metabolic panel     Status: Abnormal   Collection Time: 01/16/17  5:14 AM  Result Value Ref Range   Sodium 137 135 - 145 mmol/L   Potassium 3.3 (L) 3.5 - 5.1 mmol/L    Comment: DELTA CHECK NOTED   Chloride 96 (L) 101 - 111 mmol/L   CO2 26 22 - 32 mmol/L   Glucose, Bld 82 65 - 99 mg/dL   BUN 35 (H) 6 - 20 mg/dL   Creatinine, Ser 7.40 (H) 0.44 - 1.00 mg/dL   Calcium 8.8 (L) 8.9 - 10.3 mg/dL   GFR calc non Af Amer 6 (L) >60 mL/min   GFR calc Af Amer 7 (L) >60 mL/min    Comment: (NOTE) The eGFR has been calculated using the CKD EPI equation. This calculation has not been validated in all clinical situations. eGFR's persistently <60 mL/min signify possible Chronic Kidney Disease.    Anion gap 15 5 - 15  Glucose, capillary     Status: None   Collection Time: 01/16/17  7:30 AM  Result Value Ref Range   Glucose-Capillary 75 65 - 99 mg/dL    ABGS  Recent Labs  01/14/17 0910  PHART 7.481*  PO2ART 127*  HCO3 23.5   CULTURES Recent Results (from the past 240 hour(s))  Wound or Superficial Culture     Status: None   Collection Time: 01/12/17  5:50 PM  Result Value Ref Range Status   Specimen Description LEG RIGHT  Final   Special Requests Immunocompromised  Final   Gram Stain   Final    MODERATE WBC PRESENT, PREDOMINANTLY PMN ABUNDANT GRAM NEGATIVE RODS Performed at [facility], [place] [address], [place], [place] [address]    Culture ABUNDANT KLEBSIELLA OXYTOCA  Final   Report Status 01/15/2017 FINAL  Final   Organism ID,  Bacteria KLEBSIELLA OXYTOCA  Final      Susceptibility   Klebsiella oxytoca - MIC*    AMPICILLIN RESISTANT Resistant     CEFAZOLIN >=64 RESISTANT Resistant     CEFEPIME <=1 SENSITIVE Sensitive     CEFTAZIDIME <=1 SENSITIVE Sensitive     CEFTRIAXONE <=1 SENSITIVE Sensitive     CIPROFLOXACIN <=0.25 SENSITIVE Sensitive     GENTAMICIN <=1 SENSITIVE Sensitive     IMIPENEM <=0.25 SENSITIVE Sensitive     TRIMETH/SULFA <=20 SENSITIVE Sensitive     AMPICILLIN/SULBACTAM 8 SENSITIVE Sensitive     PIP/TAZO <=4 SENSITIVE Sensitive     Extended ESBL NEGATIVE Sensitive     * ABUNDANT KLEBSIELLA OXYTOCA  Culture, blood (Routine X 2) w Reflex to ID Panel     Status: Abnormal   Collection Time:  01/12/17  6:24 PM  Result Value Ref Range Status   Specimen Description BLOOD LEFT HAND  Final   Special Requests Blood Culture adequate volume  Final   Culture  Setup Time   Final    GRAM POSITIVE COCCI IN CLUSTERS Gram Stain Report Called to,Read Back By and Verified With: MOTLEY J. AT 1320 BY THOMPSON S. ON [id number] CRITICAL RESULT CALLED TO, READ BACK BY AND VERIFIED WITH: J. MOTLEY, RN ([facility]) AT 1808 ON 01/13/17 BY C. JESSUP, MLT.    Culture (A)  Final    STAPHYLOCOCCUS SPECIES (COAGULASE NEGATIVE) THE SIGNIFICANCE OF ISOLATING THIS ORGANISM FROM A SINGLE SET OF BLOOD CULTURES WHEN MULTIPLE SETS ARE DRAWN IS UNCERTAIN. PLEASE NOTIFY THE MICROBIOLOGY DEPARTMENT WITHIN ONE WEEK IF SPECIATION AND SENSITIVITIES ARE REQUIRED. Performed at [facility], [place] [address], [place], [place] [address]    Report Status 01/15/2017 FINAL  Final  Blood Culture ID Panel (Reflexed)     Status: Abnormal   Collection Time: 01/12/17  6:24 PM  Result Value Ref Range Status   Enterococcus species NOT DETECTED NOT DETECTED Final   Listeria monocytogenes NOT DETECTED NOT DETECTED Final   Staphylococcus species DETECTED (A) NOT DETECTED Final    Comment: Methicillin (oxacillin) resistant coagulase negative staphylococcus.  Possible blood culture contaminant (unless isolated from more than one blood culture draw or clinical case suggests pathogenicity). No antibiotic treatment is indicated for blood  culture contaminants. CRITICAL RESULT CALLED TO, READ BACK BY AND VERIFIED WITH: J. MOTLEY, RN ([facility]) ON AT 1808 ON 01/13/17 BY C. JESSUP, MLT.    Staphylococcus aureus NOT DETECTED NOT DETECTED Final   Methicillin resistance DETECTED (A) NOT DETECTED Final    Comment: CRITICAL RESULT CALLED TO, READ BACK BY AND VERIFIED WITH: J. MOTLEY, RN ([facility]) AT 1808 ON 01/13/17 BY C. JESSUP, MLT.    Streptococcus species NOT DETECTED NOT DETECTED Final   Streptococcus agalactiae NOT DETECTED NOT DETECTED Final   Streptococcus pneumoniae NOT DETECTED NOT DETECTED Final   Streptococcus pyogenes NOT DETECTED NOT DETECTED Final   Acinetobacter baumannii NOT DETECTED NOT DETECTED Final   Enterobacteriaceae species NOT DETECTED NOT DETECTED Final   Enterobacter cloacae complex NOT DETECTED NOT DETECTED Final   Escherichia coli NOT DETECTED NOT DETECTED Final   Klebsiella oxytoca NOT DETECTED NOT DETECTED Final   Klebsiella pneumoniae NOT DETECTED NOT DETECTED Final   Proteus species NOT DETECTED NOT DETECTED Final   Serratia marcescens NOT DETECTED NOT DETECTED Final   Haemophilus influenzae NOT DETECTED NOT DETECTED Final   Neisseria meningitidis NOT DETECTED NOT DETECTED Final   Pseudomonas aeruginosa NOT DETECTED NOT DETECTED Final   Candida albicans NOT DETECTED NOT DETECTED Final   Candida glabrata NOT DETECTED NOT DETECTED Final   Candida krusei NOT DETECTED NOT DETECTED Final   Candida parapsilosis NOT DETECTED NOT DETECTED Final   Candida tropicalis NOT DETECTED NOT DETECTED Final    Comment: Performed at [facility], [place] [address], [place], [place] [address]  MRSA PCR Screening     Status: None   Collection Time: 01/13/17 12:09 AM  Result Value Ref Range Status   MRSA by PCR NEGATIVE NEGATIVE Final    Comment:         The GeneXpert MRSA Assay (FDA approved for NASAL specimens only), is one component of a comprehensive MRSA colonization surveillance program. It is not intended to diagnose MRSA infection nor to guide or monitor treatment for MRSA infections.   Culture, blood (  Routine X 2) w Reflex to ID Panel     Status: None (Preliminary result)   Collection Time: 01/13/17  4:47 AM  Result Value Ref Range Status   Specimen Description BLOOD PICC LINE  Final   Special Requests   Final    BOTTLES DRAWN AEROBIC AND ANAEROBIC Blood Culture results may not be optimal due to an excessive volume of blood received in culture bottles AEROBIC BOTTLE Blood Culture results may not be optimal due to an inadequate volume of blood received in culture  bottles ANAEROBIC BOTTLE    Culture NO GROWTH 2 DAYS  Final   Report Status PENDING  Incomplete   Studies/Results: US Paracentesis  Result Date: 01/15/2017 INDICATION: Cirrhosis, ascites EXAM: ULTRASOUND GUIDED THERAPEUTIC PARACENTESIS MEDICATIONS: None. COMPLICATIONS: None immediate. PROCEDURE: Procedure, benefits, and risks of procedure were discussed with patient. Written informed consent for procedure was obtained. Time out protocol followed. Adequate collection of ascites localized by ultrasound in RIGHT lower quadrant. Skin prepped and draped in usual sterile fashion. Skin and soft tissues anesthetized with 10 mL of 1% lidocaine. 5 [place] Yueh catheter placed into peritoneal cavity. 4.9 L of yellow colored ascitic fluid aspirated by vacuum bottle suction. Procedure tolerated well by patient without immediate complication. FINDINGS: As above IMPRESSION: Successful ultrasound-guided paracentesis yielding 4.9 liters of peritoneal fluid. Electronically Signed   By: Lavonia Dana M.D.   On: 01/15/2017 14:53   Dg Chest Port 1 View  Result Date: 01/16/2017 CLINICAL DATA:  Respiratory failure EXAM: PORTABLE CHEST 1 VIEW COMPARISON:  January 15, 2017 FINDINGS: Endotracheal  tube and nasogastric tube have been removed. No pneumothorax. Central catheter tip is in the superior vena cava, unchanged. There is mild left base atelectasis. There is trace interstitial edema. No consolidation. There is cardiomegaly with pulmonary venous hypertension. No adenopathy. No bone lesions. There is aortic atherosclerosis. IMPRESSION: Central catheter tip in superior vena cava. No pneumothorax. Evidence of a degree of congestive heart failure with mild interstitial edema. Stable cardiac silhouette. Mild left base atelectasis noted. There is aortic atherosclerosis. Electronically Signed   By: Lowella Grip III M.D.   On: 01/16/2017 07:52   Dg Chest Port 1 View  Result Date: 01/15/2017 CLINICAL DATA:  Respiratory failure EXAM: PORTABLE CHEST 1 VIEW COMPARISON:  01/14/2017 FINDINGS: Cardiac shadow is stable. Endotracheal tube and nasogastric catheter are noted in satisfactory position. The lungs are well aerated bilaterally. Minimal platelike atelectasis is noted in the left base. No new focal abnormality is seen. IMPRESSION: Minimal left basilar atelectasis. No new focal abnormality is noted. Electronically Signed   By: Inez Catalina M.D.   On: 01/15/2017 07:23   Dg Chest Port 1 View  Result Date: 01/14/2017 CLINICAL DATA:  Respiratory failure EXAM: PORTABLE CHEST 1 VIEW COMPARISON:  01/13/2017 FINDINGS: ET tube tip is above the carina. There is a left arm PICC line with tip in the cavoatrial junction. OG tube tip is in the stomach. Aortic atherosclerosis. Normal heart size. Decrease in left pleural effusion and pulmonary vascular congestion. IMPRESSION: 1. Decrease in left pleural effusion and pulmonary vascular congestion. Electronically Signed   By: Kerby Moors M.D.   On: 01/14/2017 09:01    Medications:  Prior to Admission:  Prescriptions Prior to Admission  Medication Sig Dispense Refill Last Dose  . augmented betamethasone dipropionate (DIPROLENE-AF) 0.05 % cream APPLY TO THE  AFFECTED AREAS ON LEGS TWICE DAILY AS NEEDED (NOT FACE, GROIN, OR UNDER ARMS)  3 unknown  . clotrimazole (LOTRIMIN) 1 % cream  Apply 1 application topically 3 (three) times daily. 12 g 1 unknown  . lidocaine (XYLOCAINE) 2 % solution TAKE TWO TEASPOONSFUL (10ML) BY MOUTH BEFORE MEALS AND AT BEDTIME TO PREVENT CHEST PAIN WHILE EATING 100 mL 1 unknown  . LORazepam (ATIVAN) 1 MG tablet Take 1 mg by mouth 2 (two) times daily as needed for anxiety (and/or back spasms).   unknown  . midodrine (PROAMATINE) 5 MG tablet TAKE TWO (2) TABLETS BY MOUTH DAILY. TAKE ONLY ON DIALYSIS DAYS ON TUESDAYS, THURDAYS, AND SATURDAYS (SOMETIME TAKES TREATMENTS ON FRIDAY IN  3 unknown  . Oxycodone HCl 10 MG TABS Take 0.5 tablets (5 mg total) by mouth 3 (three) times daily as needed. 1 PO TID AS NEEDED FOR PAIN (Patient taking differently: Take 5-10 mg by mouth 3 (three) times daily as needed. ) 1 tablet 0 unknown  . Darbepoetin Alfa (ARANESP) 60 MCG/0.3ML SOSY injection Inject 0.3 mLs (60 mcg total) into the vein every Thursday with hemodialysis. (Patient taking differently: Inject 60 mcg into the vein every Tuesday with hemodialysis. ) 4.2 mL  12/22/2016 at Unknown time  . lactulose (CHRONULAC) 10 GM/15ML solution Take 15-30 mLs (10-20 g total) by mouth 3 (three) times daily. TAKE 15-30 ml BY MOUTH three times a day   12/22/2016 at Unknown time  . lidocaine-prilocaine (EMLA) cream Apply 1 application topically as needed (for dialysis treatments).   Not Taking at Unknown time  . midodrine (PROAMATINE) 10 MG tablet Take 1 tablet (10 mg total) by mouth 3 (three) times daily.   12/22/2016 at Unknown time  . omeprazole (PRILOSEC) 20 MG capsule 1 po every morning 30 minutes prior to your first meal. (Patient taking differently: Take 20 mg by mouth daily before breakfast. 1 po every morning 30 minutes prior to your first meal.) 31 capsule 11 12/21/2016 at Unknown time  . RENVELA 800 MG tablet Take 1600 mg by mouth 3 times daily with meals.  Take 800 mg by mouth with snacks.  5 12/21/2016 at Unknown time  . sucralfate (CARAFATE) 1 GM/10ML suspension Take 1 g by mouth 4 (four) times daily -  with meals and at bedtime.   12/21/2016 at Unknown time   Scheduled: . chlorhexidine gluconate (MEDLINE KIT)  15 mL Mouth Rinse BID  . Chlorhexidine Gluconate Cloth  6 each Topical Daily  . epoetin (EPOGEN/PROCRIT) injection  10,000 Units Subcutaneous Q M,W,F-HD  . lactulose  30 g Per Tube TID  . mouth rinse  15 mL Mouth Rinse QID  . [START ON 01/18/2017] midodrine  10 mg Oral Q M,W,F-HD  . pantoprazole  40 mg Oral Q0600  . sodium chloride flush  10-40 mL Intracatheter Q12H  . sodium chloride flush  3 mL Intravenous Q12H   Continuous: . sodium chloride Stopped (01/13/17 0948)  . sodium chloride    . sodium chloride    . sodium chloride    . cefTRIAXone (ROCEPHIN)  IV    . fentaNYL infusion INTRAVENOUS Stopped (01/15/17 1029)   JOI:NOMVEH chloride, sodium chloride, alteplase, fentaNYL, fentaNYL (SUBLIMAZE) injection, heparin, lidocaine (PF), lidocaine, lidocaine-prilocaine, ondansetron **OR** ondansetron (ZOFRAN) IV, pentafluoroprop-tetrafluoroeth, phenol, sodium chloride flush  Assesment: She was admitted with hepatic and uremic encephalopathy. She then developed bleeding that seemed to be mostly from her upper airway and was intubated for airway protection. There was concern that she had aspiration pneumonia but I don't see definite pneumonia on x-rays that I have personally reviewed. She has been on Zosyn which should cover aspiration although I see  from Dr. Gus Puma note that she doesn't want to take that. She is anemic and is going to get blood today. She doesn't seem to be having any respiratory issues at all now. She is still on oxygen. 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 Thanks for allowing me to see her with you. I will be out of town after today until the 18th    LOS: 4 days   Jacody Beneke L 01/16/2017, 8:28 AM

## 2017-01-16 NOTE — Progress Notes (Signed)
PROGRESS NOTE    Wanda [name]  DEY:[id number] DOB: [birth year] DOA: 01/12/2017 PCP: [name], MD    Brief Narrative: 41 yo with ESRD on HD, NASH, non compliance, thrombocytopenia, admitted for AMS, elevated NH3 to 300's, nasal bleeding, intubated for airway protection and lower extremities cellulitis. She has been intubated for airways protection. She had EGD showing no GI bleed, and HD yesterday. She is more alert today, blinking her eyes to answer questions.  She had some bleeding on her lower leg, but it eventually stopped.  She was extubated yesterday, and did well.  She also had LVP without bleeding complication.  She is alert and converses meaningfully today.  She refuses lactulose, and doesn't want to take the Zosyn.   Assessment & Plan:   Active Problems:   Liver cirrhosis secondary to nonalcoholic steatohepatitis (NASH) (HCC)   Thrombocytopenia (HCC)   End stage renal disease on dialysis (HCC)   Bullous skin disease   Altered mental status   Cellulitis   Encounter for orogastric (OG) tube placement   Hepatic encephalopathy ([place])   Uremia  1. Altered mental status- Likely due to hepatic encephalopathy. She will need lactulose at some point. NH3 level is now normal at 27. Will start her renal diet today.   2. End-stage renal disease-patient missed hemodialysis for past 2 weeks,potassium is 5.7, BUN 120, creatinine 20.34.  Now K is normal.  Continue with routine dialysis.  3. Cellulitis/wound infection-patient has bullous skin disease, presenting with cellulitis and infected bulla of the right lower extremity.Wound culture has been obtained. Will now transition Van/Zosyn to Rocephin at 2 g per day.  4. Chronic thrombocytopenia- platelet count is adequate, but maybe dysfx due to uremia.  This should improve with continued dialysis support.  5. Liver cirrhosis,secondary to [place]- liver enzymes are stable, blood pressure stable.   Will give oral meds as required. S/p  4.6 L of paracentesis.   She did not bleed with the procedure.  6 Intubation: Weaning to extubation. Pulmonary consultation appreciated.  7. Nasal bleeding; resolved. 8.   Anemia:  Will give her one unit today.   DVT prophylaxis:SCD. Code Status:FULL CODE.  Family Communication:Son updated.  Disposition Plan:TBD.  Consultants:  GI, renal, PCCM.   Procedures:   EGD.    LVP  Intubation.  Antimicrobials: Anti-infectives    Start     Dose/Rate Route Frequency Ordered Stop   01/16/17 0730  cefTRIAXone (ROCEPHIN) 2 g in dextrose 5 % 50 mL IVPB     2 g 100 mL/hr over 30 Minutes Intravenous Every 24 hours 01/16/17 0723     01/15/17 1200  vancomycin (VANCOCIN) IVPB 750 mg/150 ml premix     750 mg 150 mL/hr over 60 Minutes Intravenous Every M-W-F (Hemodialysis) 01/14/17 0758     01/13/17 1800  vancomycin (VANCOCIN) IVPB 750 mg/150 ml premix     750 mg 150 mL/hr over 60 Minutes Intravenous  Once 01/13/17 1046 01/13/17 1630   01/13/17 1000  piperacillin-tazobactam (ZOSYN) IVPB 3.375 g  Status:  Discontinued     3.375 g 12.5 mL/hr over 240 Minutes Intravenous Every 12 hours 01/13/17 0741 01/16/17 0723   01/12/17 1630  vancomycin (VANCOCIN) IVPB 1000 mg/200 mL premix     1,000 mg 200 mL/hr over 60 Minutes Intravenous  Once 01/12/17 1627 01/13/17 0133   01/12/17 1630  piperacillin-tazobactam (ZOSYN) IVPB 3.375 g     3.375 g 100 mL/hr over 30 Minutes Intravenous  Once 01/12/17 1627 01/13/17 0104  Subjective:  Complained of diarrhea.   Objective: Vitals:   01/16/17 0600 01/16/17 0615 01/16/17 0630 01/16/17 0700  BP: 131/73 117/69 123/61 126/70  Pulse: 96 89 91 97  Resp: _0 Temp: 97.5 F (36.4 C) 97.7 F (36.5 C) 97.5 F (36.4 C) 97.5 F (36.4 C)  TempSrc:      SpO2: 100% 100% 100% 100%  Weight:      Height:        Intake/Output Summary (Last 24 hours) at 01/16/17 0724 Last data filed at 01/15/17 2100  Gross per 24 hour  Intake               580 ml  Output             2000 ml  Net            -1420 ml   Filed Weights   01/15/17 0500 01/15/17 1330 01/16/17 0400  Weight: 71.3 kg (157 lb 3 oz) 67.3 kg (148 lb 5.9 oz) 63.4 kg (139 lb 12.4 oz)    Examination:  General exam: Appears calm and comfortable  Respiratory system: Clear to auscultation. Respiratory effort normal. Cardiovascular system: S1 & S2 heard, RRR. No JVD, murmurs, rubs, gallops or clicks. No pedal edema. Gastrointestinal system: Abdomen is nondistended, soft and nontender. No organomegaly or masses felt. Normal bowel sounds heard. Central nervous system: Alert and oriented. No focal neurological deficits. Extremities: Symmetric 5 x 5 power. Skin: No rashes, lesions or ulcers Psychiatry: Judgement and insight appear normal. Mood & affect appropriate.   Data Reviewed: I have personally reviewed following labs and imaging studies  CBC:  Recent Labs Lab 01/12/17 1824 01/13/17 0447 01/13/17 1623 01/14/17 0811 01/14/17 1948 01/15/17 0800 01/16/17 0514  WBC 6.6 10.5 14.9* 9.4  --  10.1 8.9  NEUTROABS 5.5  --  12.9* 7.3  --  7.6 PENDING  HGB 8.1* 8.3* 8.4* 6.2* 8.1* 7.7* 6.7*  HCT 24.7* 24.8* 25.4* 19.5* 24.1* 23.3* 20.9*  MCV 91.1 90.5 89.8 93.3  --  91.0 93.7  PLT 82* 110* 236 110*  --  95* 64*   Basic Metabolic Panel:  Recent Labs Lab 01/12/17 1824 01/13/17 0447 01/14/17 0811 01/15/17 0800 01/16/17 0514  NA 140 140 145 142 137  K 5.7* 5.8* 3.9 4.0 3.3*  CL 101 101 103 103 96*  CO2 14* 14* 23 21* 26  GLUCOSE 121* 99 125* 108* 82  BUN 120* 132* 69* 77* 35*  CREATININE 20.34* 20.48* 12.10* 12.97* 7.40*  CALCIUM 7.9* 8.0* 9.0 9.0 8.8*   GFR: Estimated Creatinine Clearance: 8.4 mL/min (A) (by C-G formula based on SCr of 7.4 mg/dL (H)). Liver Function Tests:  Recent Labs Lab 01/12/17 1824 01/13/17 0447 01/14/17 0811  AST 37 36 38  ALT _1 ALKPHOS 93 109 88  BILITOT 0.8 1.0 1.4*  PROT 6.3* 6.8 6.1*  ALBUMIN 2.8* 3.0* 2.7*     Recent Labs Lab 01/12/17 1824  LIPASE 64*    Recent Labs Lab 01/12/17 1824 01/13/17 0447 01/14/17 0811  AMMONIA 311* 205* 27   Coagulation Profile:  Recent Labs Lab 01/12/17 1824 01/13/17 0825  INR 1.19 1.24   Cardiac Enzymes:  Recent Labs Lab 01/12/17 1824  TROPONINI <0.03   CBG:  Recent Labs Lab 01/15/17 1135 01/15/17 1544 01/15/17 2015 01/15/17 2347 01/16/17 0422  GLUCAP 103* 84 86 81 82   Sepsis Labs:  Recent Labs Lab 01/12/17 1725 01/12/17 2126  LATICACIDVEN 2.40* 1.9  Recent Results (from the past 240 hour(s))  Wound or Superficial Culture     Status: None   Collection Time: 01/12/17  5:50 PM  Result Value Ref Range Status   Specimen Description LEG RIGHT  Final   Special Requests Immunocompromised  Final   Gram Stain   Final    MODERATE WBC PRESENT, PREDOMINANTLY PMN ABUNDANT GRAM NEGATIVE RODS Performed at [facility], [address], [place], [place] [address]    Culture ABUNDANT KLEBSIELLA OXYTOCA  Final   Report Status 01/15/2017 FINAL  Final   Organism ID, Bacteria KLEBSIELLA OXYTOCA  Final      Susceptibility   Klebsiella oxytoca - MIC*    AMPICILLIN RESISTANT Resistant     CEFAZOLIN >=64 RESISTANT Resistant     CEFEPIME <=1 SENSITIVE Sensitive     CEFTAZIDIME <=1 SENSITIVE Sensitive     CEFTRIAXONE <=1 SENSITIVE Sensitive     CIPROFLOXACIN <=0.25 SENSITIVE Sensitive     GENTAMICIN <=1 SENSITIVE Sensitive     IMIPENEM <=0.25 SENSITIVE Sensitive     TRIMETH/SULFA <=20 SENSITIVE Sensitive     AMPICILLIN/SULBACTAM 8 SENSITIVE Sensitive     PIP/TAZO <=4 SENSITIVE Sensitive     Extended ESBL NEGATIVE Sensitive     * ABUNDANT KLEBSIELLA OXYTOCA  Culture, blood (Routine X 2) w Reflex to ID Panel     Status: Abnormal   Collection Time: 01/12/17  6:24 PM  Result Value Ref Range Status   Specimen Description BLOOD LEFT HAND  Final   Special Requests Blood Culture adequate volume  Final   Culture  Setup Time   Final     GRAM POSITIVE COCCI IN CLUSTERS Gram Stain Report Called to,Read Back By and Verified With: MOTLEY J. AT 1320 BY THOMPSON S. ON [id number] CRITICAL RESULT CALLED TO, READ BACK BY AND VERIFIED WITH: J. MOTLEY, RN ([facility]) AT 1808 ON 01/13/17 BY C. JESSUP, MLT.    Culture (A)  Final    STAPHYLOCOCCUS SPECIES (COAGULASE NEGATIVE) THE SIGNIFICANCE OF ISOLATING THIS ORGANISM FROM A SINGLE SET OF BLOOD CULTURES WHEN MULTIPLE SETS ARE DRAWN IS UNCERTAIN. PLEASE NOTIFY THE MICROBIOLOGY DEPARTMENT WITHIN ONE WEEK IF SPECIATION AND SENSITIVITIES ARE REQUIRED. Performed at [facility], [place] [address], [place], [place] [address]    Report Status 01/15/2017 FINAL  Final  Blood Culture ID Panel (Reflexed)     Status: Abnormal   Collection Time: 01/12/17  6:24 PM  Result Value Ref Range Status   Enterococcus species NOT DETECTED NOT DETECTED Final   Listeria monocytogenes NOT DETECTED NOT DETECTED Final   Staphylococcus species DETECTED (A) NOT DETECTED Final    Comment: Methicillin (oxacillin) resistant coagulase negative staphylococcus. Possible blood culture contaminant (unless isolated from more than one blood culture draw or clinical case suggests pathogenicity). No antibiotic treatment is indicated for blood  culture contaminants. CRITICAL RESULT CALLED TO, READ BACK BY AND VERIFIED WITH: J. MOTLEY, RN ([facility]) ON AT 1808 ON 01/13/17 BY C. JESSUP, MLT.    Staphylococcus aureus NOT DETECTED NOT DETECTED Final   Methicillin resistance DETECTED (A) NOT DETECTED Final    Comment: CRITICAL RESULT CALLED TO, READ BACK BY AND VERIFIED WITH: J. MOTLEY, RN ([facility]) AT 1808 ON 01/13/17 BY C. JESSUP, MLT.    Streptococcus species NOT DETECTED NOT DETECTED Final   Streptococcus agalactiae NOT DETECTED NOT DETECTED Final   Streptococcus pneumoniae NOT DETECTED NOT DETECTED Final   Streptococcus pyogenes NOT DETECTED NOT DETECTED Final   Acinetobacter baumannii NOT DETECTED  NOT DETECTED Final   Enterobacteriaceae  species NOT DETECTED NOT DETECTED Final   Enterobacter cloacae complex NOT DETECTED NOT DETECTED Final   Escherichia coli NOT DETECTED NOT DETECTED Final   Klebsiella oxytoca NOT DETECTED NOT DETECTED Final   Klebsiella pneumoniae NOT DETECTED NOT DETECTED Final   Proteus species NOT DETECTED NOT DETECTED Final   Serratia marcescens NOT DETECTED NOT DETECTED Final   Haemophilus influenzae NOT DETECTED NOT DETECTED Final   Neisseria meningitidis NOT DETECTED NOT DETECTED Final   Pseudomonas aeruginosa NOT DETECTED NOT DETECTED Final   Candida albicans NOT DETECTED NOT DETECTED Final   Candida glabrata NOT DETECTED NOT DETECTED Final   Candida krusei NOT DETECTED NOT DETECTED Final   Candida parapsilosis NOT DETECTED NOT DETECTED Final   Candida tropicalis NOT DETECTED NOT DETECTED Final    Comment: Performed at [facility], [place] [address], [place], [place] [address]  MRSA PCR Screening     Status: None   Collection Time: 01/13/17 12:09 AM  Result Value Ref Range Status   MRSA by PCR NEGATIVE NEGATIVE Final    Comment:        The GeneXpert MRSA Assay (FDA approved for NASAL specimens only), is one component of a comprehensive MRSA colonization surveillance program. It is not intended to diagnose MRSA infection nor to guide or monitor treatment for MRSA infections.   Culture, blood (Routine X 2) w Reflex to ID Panel     Status: None (Preliminary result)   Collection Time: 01/13/17  4:47 AM  Result Value Ref Range Status   Specimen Description BLOOD PICC LINE  Final   Special Requests   Final    BOTTLES DRAWN AEROBIC AND ANAEROBIC Blood Culture results may not be optimal due to an excessive volume of blood received in culture bottles AEROBIC BOTTLE Blood Culture results may not be optimal due to an inadequate volume of blood received in culture  bottles ANAEROBIC BOTTLE    Culture NO GROWTH 2 DAYS  Final   Report Status PENDING  Incomplete     Radiology Studies: US  Paracentesis  Result Date: 01/15/2017 INDICATION: Cirrhosis, ascites EXAM: ULTRASOUND GUIDED THERAPEUTIC PARACENTESIS MEDICATIONS: None. COMPLICATIONS: None immediate. PROCEDURE: Procedure, benefits, and risks of procedure were discussed with patient. Written informed consent for procedure was obtained. Time out protocol followed. Adequate collection of ascites localized by ultrasound in RIGHT lower quadrant. Skin prepped and draped in usual sterile fashion. Skin and soft tissues anesthetized with 10 mL of 1% lidocaine. 5 [place] Yueh catheter placed into peritoneal cavity. 4.9 L of yellow colored ascitic fluid aspirated by vacuum bottle suction. Procedure tolerated well by patient without immediate complication. FINDINGS: As above IMPRESSION: Successful ultrasound-guided paracentesis yielding 4.9 liters of peritoneal fluid. Electronically Signed   By: Lavonia Dana M.D.   On: 01/15/2017 14:53   Dg Chest Port 1 View  Result Date: 01/15/2017 CLINICAL DATA:  Respiratory failure EXAM: PORTABLE CHEST 1 VIEW COMPARISON:  01/14/2017 FINDINGS: Cardiac shadow is stable. Endotracheal tube and nasogastric catheter are noted in satisfactory position. The lungs are well aerated bilaterally. Minimal platelike atelectasis is noted in the left base. No new focal abnormality is seen. IMPRESSION: Minimal left basilar atelectasis. No new focal abnormality is noted. Electronically Signed   By: Inez Catalina M.D.   On: 01/15/2017 07:23   Dg Chest Port 1 View  Result Date: 01/14/2017 CLINICAL DATA:  Respiratory failure EXAM: PORTABLE CHEST 1 VIEW COMPARISON:  01/13/2017 FINDINGS: ET tube tip is above  the carina. There is a left arm PICC line with tip in the cavoatrial junction. OG tube tip is in the stomach. Aortic atherosclerosis. Normal heart size. Decrease in left pleural effusion and pulmonary vascular congestion. IMPRESSION: 1. Decrease in left pleural effusion and pulmonary vascular congestion. Electronically Signed   By:  Kerby Moors M.D.   On: 01/14/2017 09:01    Scheduled Meds: . chlorhexidine gluconate (MEDLINE KIT)  15 mL Mouth Rinse BID  . Chlorhexidine Gluconate Cloth  6 each Topical Daily  . epoetin (EPOGEN/PROCRIT) injection  10,000 Units Subcutaneous Q M,W,F-HD  . lactulose  30 g Per Tube TID  . mouth rinse  15 mL Mouth Rinse QID  . [START ON 01/18/2017] midodrine  10 mg Oral Q M,W,F-HD  . pantoprazole  40 mg Oral Q0600  . sodium chloride flush  10-40 mL Intracatheter Q12H  . sodium chloride flush  3 mL Intravenous Q12H   Continuous Infusions: . sodium chloride Stopped (01/13/17 0948)  . sodium chloride    . sodium chloride    . sodium chloride    . cefTRIAXone (ROCEPHIN)  IV    . fentaNYL infusion INTRAVENOUS Stopped (01/15/17 1029)  . vancomycin Stopped (01/15/17 1814)     LOS: 4 days   Del Overfelt, MD FACP Hospitalist.   If 7PM-7AM, please contact night-coverage [email or web address] Password [email or web address] 01/16/2017, 7:24 AM

## 2017-01-16 NOTE — Progress Notes (Signed)
1 unit of blood ordered and started.

## 2017-01-17 LAB — GLUCOSE, CAPILLARY
GLUCOSE-CAPILLARY: 71 mg/dL (ref 65–99)
GLUCOSE-CAPILLARY: 76 mg/dL (ref 65–99)

## 2017-01-17 LAB — TYPE AND SCREEN
ABO/RH(D): A POS
Antibody Screen: NEGATIVE
Unit division: 0

## 2017-01-17 LAB — CBC WITH DIFFERENTIAL/PLATELET
BASOS ABS: 0 10*3/uL (ref 0.0–0.1)
Basophils Relative: 0 %
EOS ABS: 0.4 10*3/uL (ref 0.0–0.7)
Eosinophils Relative: 6 %
HCT: 25.4 % — ABNORMAL LOW (ref 36.0–46.0)
Hemoglobin: 8.6 g/dL — ABNORMAL LOW (ref 12.0–15.0)
Lymphocytes Relative: 11 %
Lymphs Abs: 0.7 10*3/uL (ref 0.7–4.0)
MCH: 31.6 pg (ref 26.0–34.0)
MCHC: 33.9 g/dL (ref 30.0–36.0)
MCV: 93.4 fL (ref 78.0–100.0)
MONO ABS: 0.6 10*3/uL (ref 0.1–1.0)
Monocytes Relative: 9 %
NEUTROS ABS: 5.2 10*3/uL (ref 1.7–7.7)
Neutrophils Relative %: 75 %
PLATELETS: 59 10*3/uL — AB (ref 150–400)
RBC: 2.72 MIL/uL — ABNORMAL LOW (ref 3.87–5.11)
RDW: 17 % — AB (ref 11.5–15.5)
WBC: 7 10*3/uL (ref 4.0–10.5)

## 2017-01-17 LAB — COMPREHENSIVE METABOLIC PANEL
ALT: 15 U/L (ref 14–54)
ANION GAP: 18 — AB (ref 5–15)
AST: 25 U/L (ref 15–41)
Albumin: 2.7 g/dL — ABNORMAL LOW (ref 3.5–5.0)
Alkaline Phosphatase: 77 U/L (ref 38–126)
BUN: 43 mg/dL — AB (ref 6–20)
CHLORIDE: 95 mmol/L — AB (ref 101–111)
CO2: 24 mmol/L (ref 22–32)
Calcium: 8.5 mg/dL — ABNORMAL LOW (ref 8.9–10.3)
Creatinine, Ser: 8.47 mg/dL — ABNORMAL HIGH (ref 0.44–1.00)
GFR calc Af Amer: 6 mL/min — ABNORMAL LOW (ref >60)
GFR calc non Af Amer: 5 mL/min — ABNORMAL LOW (ref >60)
GLUCOSE: 75 mg/dL (ref 65–99)
POTASSIUM: 3.4 mmol/L — AB (ref 3.5–5.1)
Sodium: 137 mmol/L (ref 135–145)
Total Bilirubin: 2 mg/dL — ABNORMAL HIGH (ref 0.3–1.2)
Total Protein: 5.9 g/dL — ABNORMAL LOW (ref 6.5–8.1)

## 2017-01-17 LAB — AMMONIA: Ammonia: 22 umol/L (ref 9–35)

## 2017-01-17 LAB — BPAM RBC
Blood Product Expiration Date: 201806202359
ISSUE DATE / TIME: 201806091037
Unit Type and Rh: 6200

## 2017-01-17 IMAGING — US US PARACENTESIS
1 series · 5 of 5 positions shown · non-contrast
Comparison: none

INDICATION: Cirrhosis with ascites.

[Series 1: us paracentesis · 0.25mm/px · 5 of 5 slices shown]
[im 1/5]
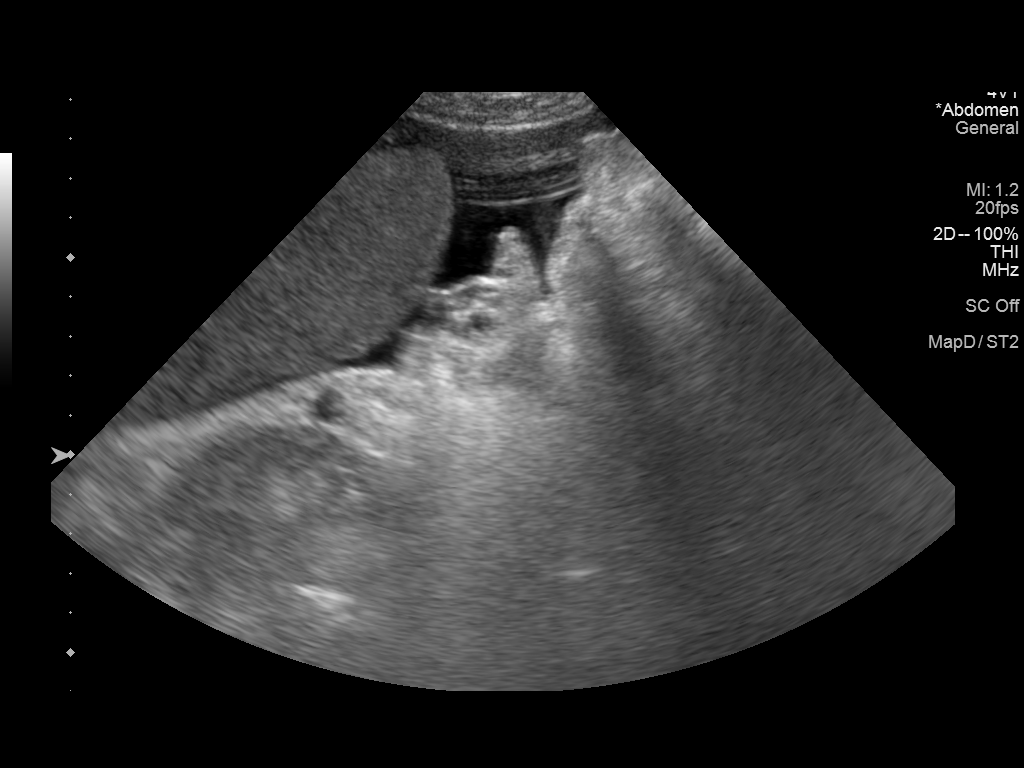
[im 2/5]
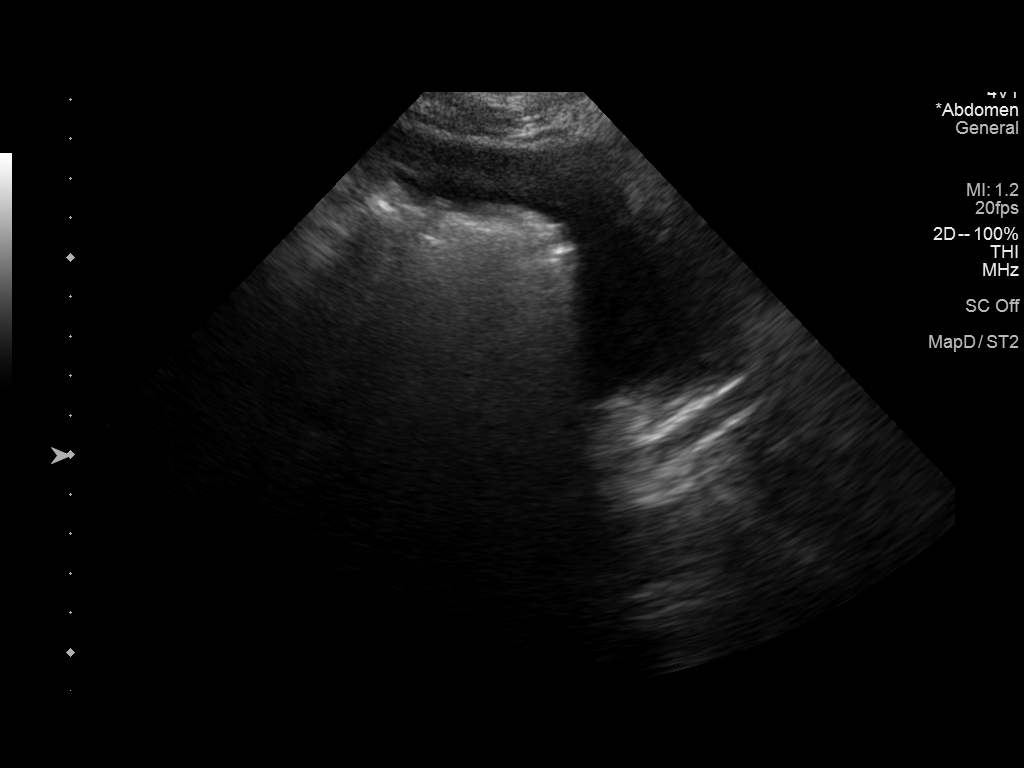
[im 3/5]
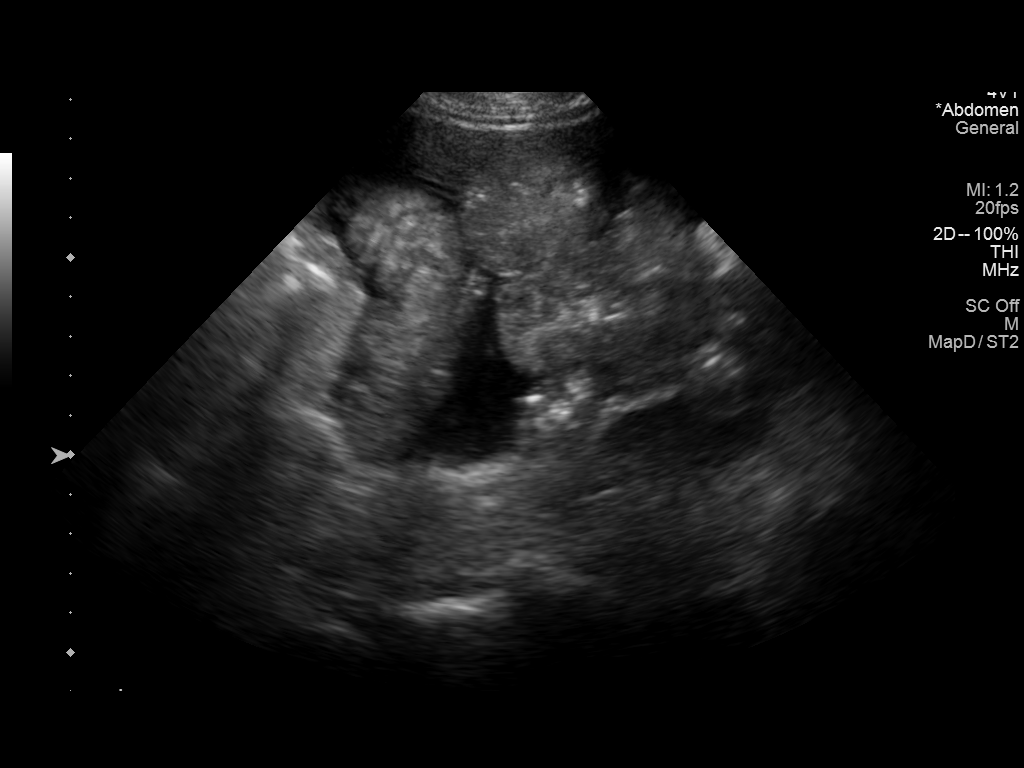
[im 4/5]
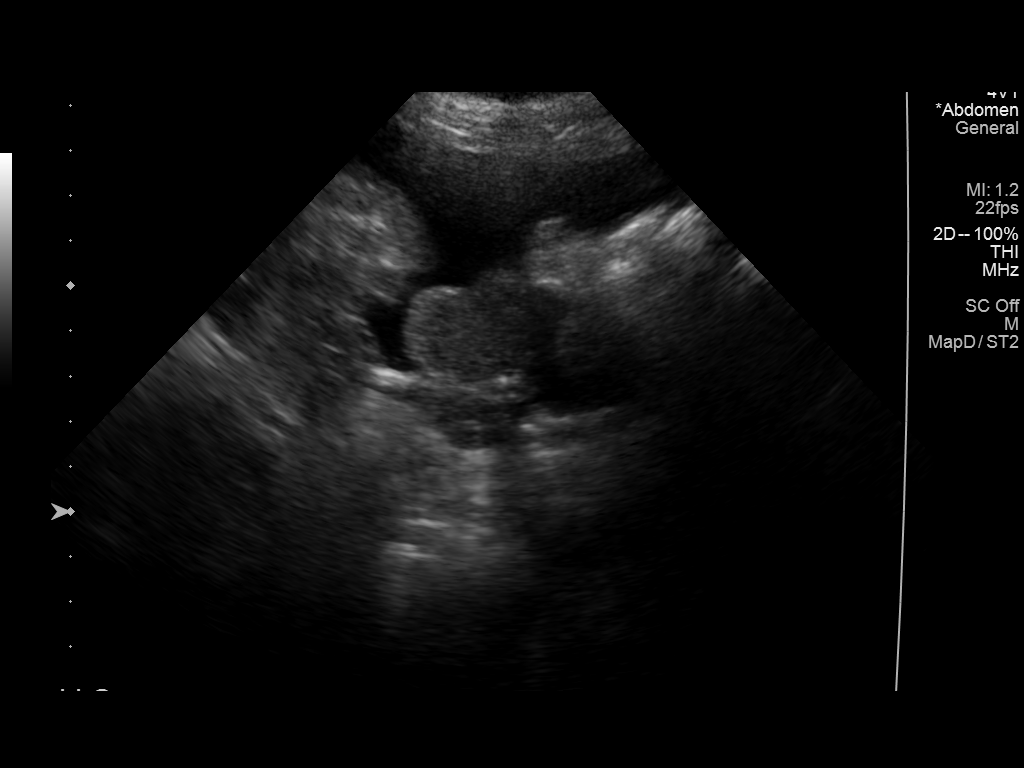
[im 5/5]
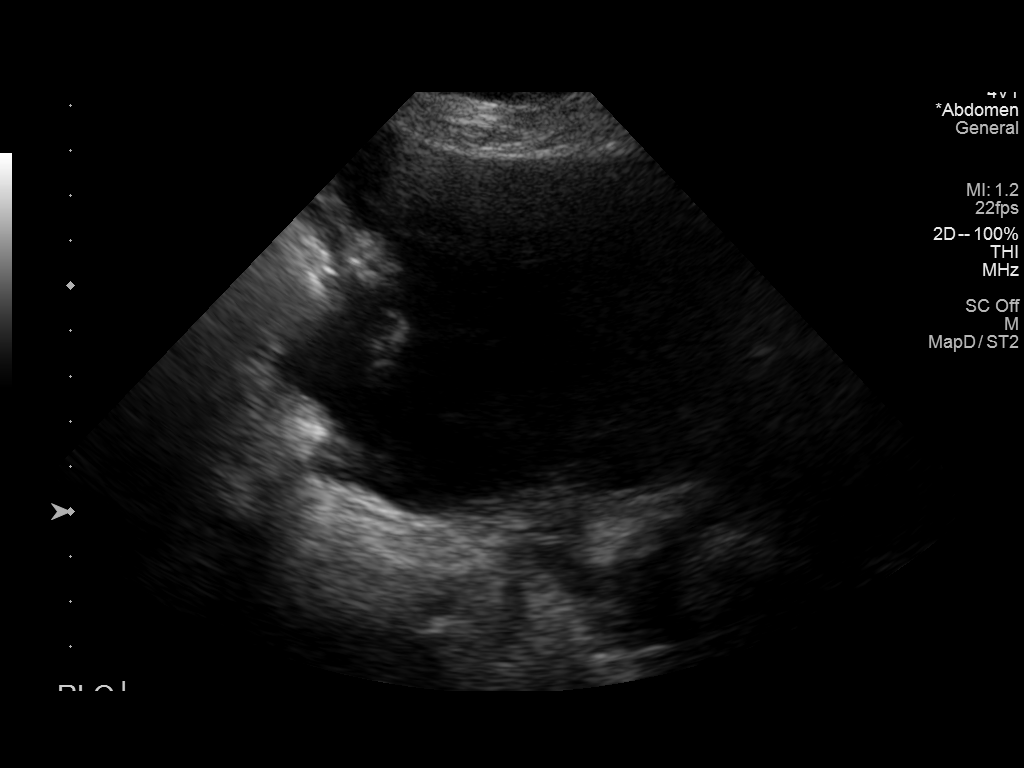

[5 of 5 positions shown; findings below may reference images not displayed]

EXAM:
ULTRASOUND GUIDED  PARACENTESIS

MEDICATIONS:
None.

COMPLICATIONS:
None immediate.

PROCEDURE:
Informed written consent was obtained from the patient after a
discussion of the risks, benefits and alternatives to treatment. A
timeout was performed prior to the initiation of the procedure.

Initial ultrasound scanning demonstrates a large amount of ascites
within the right lower abdominal quadrant. The right lower abdomen
was prepped and draped in the usual sterile fashion. 1% lidocaine
with epinephrine was used for local anesthesia.

Following this, a Yueh catheter was introduced. An ultrasound image
was saved for documentation purposes. The paracentesis was
performed. The catheter was removed and a dressing was applied. The
patient tolerated the procedure well without immediate post
procedural complication.
FINDINGS: A total of approximately 8868 cc of yellow fluid was removed.
IMPRESSION: Successful ultrasound-guided paracentesis yielding 2.25 liters of
peritoneal fluid.

## 2017-01-17 MED ORDER — LACTULOSE 10 GM/15ML PO SOLN
30.0000 g | Freq: Three times a day (TID) | ORAL | Status: DC
  Administered 2017-01-17: 30 g via ORAL
  Filled 2017-01-17: qty 60

## 2017-01-17 NOTE — Progress Notes (Signed)
Patient ID: Wanda [name], female   DOB: Apr 29, 1976, 41 y.o.   MRN: [id number]   Assessment/Plan: ADMITTED WITH MS CHANGES. CLINICALLY IMPROVED.  PLAN: 1. [place] 2. SUPPORTIVE CARE 3. LACTULOSE IF NEEDED FOR MS CHANGES   Subjective: Since I last evaluated the patient SHE'S HAVING SML STOOL BUT NO BRBPR OR MELENA.  Objective: Vital signs in last 24 hours: Vitals:   01/17/17 0530 01/17/17 0600  BP: 123/87 127/66  Pulse: 95 94  Resp: 19 16  Temp: 97.5 F (36.4 C) 97.5 F (36.4 C)   General appearance: alert, cooperative and no distress Resp: clear to auscultation bilaterally Cardio: regular rate and rhythm GI: soft, non-tender; bowel sounds normal;   Lab Results:  K 3.4 T BILI 2.0 Hb 8.6 PLT CT 59   Studies/Results: No results found.  Medications: I have reviewed the patient's current medications.   LOS: 5 days   Barney Drain 01/18/2014, 2:23 PM

## 2017-01-17 NOTE — Procedures (Signed)
    HEMODIALYSIS TREATMENT NOTE:  3.5 hour heparin-free dialysis completed via right upper arm AVF (15g/antegrade). Goal met: 2 liters removed without interruption in ultrafiltration.  Hemodynamically stable throughout HD session.  All blood was returned and hemostasis was achieved within 10 minutes. Report given to Dustin Folks, RN.  Rockwell [place], RN, CDN

## 2017-01-17 NOTE — Progress Notes (Signed)
PROGRESS NOTE    Wanda [name]  ZOX:[id number] DOB: Jul 23, 1976 DOA: 01/12/2017 PCP: [name], MD    Brief Narrative: 41 yo with ESRD on HD, NASH, non compliance, thrombocytopenia, admitted for AMS, elevated NH3 to 300's, nasal bleeding, intubated for airway protection and lower extremities cellulitis. She has been intubated for airways protection. She had EGD showing no GI bleed, and HD yesterday. She is more alert today, blinking her eyes to answer questions. She had some bleeding on her lower leg, but it eventually stopped. She was extubated yesterday, and did well.  She also had LVP without bleeding complication.  She is alert and converses meaningfully today.  She refuses lactulose, and doesn't want to take the Zosyn. Her antibiotics was changed to IV Rocephin.  She is eating and has no complained today.  She was given one unit of PRBC yesterday.     Assessment & Plan:   Active Problems:   Liver cirrhosis secondary to nonalcoholic steatohepatitis (NASH) (HCC)   Thrombocytopenia (HCC)   End stage renal disease on dialysis (HCC)   Bullous skin disease   Altered mental status   Cellulitis   Encounter for orogastric (OG) tube placement   Hepatic encephalopathy ([place])   Uremia  1. Altered mental status- Likely due to hepatic encephalopathy. She will need lactulose at some point. NH3 level is now normal at 27. Will start her renal diet today.  She requested another NH3 and I will comply.  2. End-stage renal disease-patient missed hemodialysis for past 2 weeks,potassium is 5.7, BUN 120, creatinine 20.34.  Now K is normal.  Continue with routine dialysis.  3. Cellulitis/wound infection-patient has bullous skin disease, presenting with cellulitis and infected bulla of the right lower extremity.Was on Van/Zosyn to Rocephin at 2 g per day.  4. Chronic thrombocytopenia- platelet count is adequate, but maybe dysfx due to uremia.  This should improve with continued dialysis  support.  5. Liver cirrhosis,secondary to [place]- liver enzymes are stable, blood pressure stable.  Will give oral meds as required. S/p 4.6 L of paracentesis.   She did not bleed with the procedure.  6 Intubation: extubated. Pulmonary consultation appreciated.  7. Nasal bleeding; resolved. 8.   Anemia:  One unit yesterday brought Hb to 8.6 g per dL.   DVT prophylaxis:SCD. Code Status:FULL CODE.  Family Communication:Son updated.  Disposition Plan:TBD.  Consultants:  GI, renal, PCCM.   Antimicrobials: Anti-infectives    Start     Dose/Rate Route Frequency Ordered Stop   01/16/17 0900  cefTRIAXone (ROCEPHIN) 2 g in dextrose 5 % 50 mL IVPB     2 g 100 mL/hr over 30 Minutes Intravenous Every 24 hours 01/16/17 0723     01/15/17 1200  vancomycin (VANCOCIN) IVPB 750 mg/150 ml premix  Status:  Discontinued     750 mg 150 mL/hr over 60 Minutes Intravenous Every M-W-F (Hemodialysis) 01/14/17 0758 01/16/17 0753   01/13/17 1800  vancomycin (VANCOCIN) IVPB 750 mg/150 ml premix     750 mg 150 mL/hr over 60 Minutes Intravenous  Once 01/13/17 1046 01/13/17 1630   01/13/17 1000  piperacillin-tazobactam (ZOSYN) IVPB 3.375 g  Status:  Discontinued     3.375 g 12.5 mL/hr over 240 Minutes Intravenous Every 12 hours 01/13/17 0741 01/16/17 0723   01/12/17 1630  vancomycin (VANCOCIN) IVPB 1000 mg/200 mL premix     1,000 mg 200 mL/hr over 60 Minutes Intravenous  Once 01/12/17 1627 01/13/17 0133   01/12/17 1630  piperacillin-tazobactam (ZOSYN) IVPB 3.375 g  3.375 g 100 mL/hr over 30 Minutes Intravenous  Once 01/12/17 1627 01/13/17 0104       Subjective: Doing better.   Objective: Vitals:   01/17/17 0430 01/17/17 0500 01/17/17 0530 01/17/17 0600  BP: 132/66 130/71 123/87 127/66  Pulse: 95 94 95 94  Resp: 15 15 19 16   Temp: 97.5 F (36.4 C) 97.5 F (36.4 C) 97.5 F (36.4 C) 97.5 F (36.4 C)  TempSrc:      SpO2: 98% 99% 98% 99%  Weight:  63 kg (138 lb 14.2 oz)    Height:         Intake/Output Summary (Last 24 hours) at 01/17/17 0810 Last data filed at 01/17/17 0721  Gross per 24 hour  Intake              945 ml  Output               20 ml  Net              925 ml   Filed Weights   01/15/17 1330 01/16/17 0400 01/17/17 0500  Weight: 67.3 kg (148 lb 5.9 oz) 63.4 kg (139 lb 12.4 oz) 63 kg (138 lb 14.2 oz)    Examination:  General exam: Appears calm and comfortable  Respiratory system: Clear to auscultation. Respiratory effort normal. Cardiovascular system: S1 & S2 heard, RRR. No JVD, murmurs, rubs, gallops or clicks. No pedal edema. Gastrointestinal system: Abdomen is nondistended, soft and nontender. No organomegaly or masses felt. Normal bowel sounds heard. Central nervous system: Alert and oriented. No focal neurological deficits. Extremities: Symmetric 5 x 5 power. Skin: No rashes, lesions or ulcers Psychiatry: Judgement and insight appear normal. Mood & affect appropriate.   Data Reviewed: I have personally reviewed following labs and imaging studies  CBC:  Recent Labs Lab 01/13/17 1623 01/14/17 0811 01/14/17 1948 01/15/17 0800 01/16/17 0514 01/17/17 0435  WBC 14.9* 9.4  --  10.1 8.9 7.0  NEUTROABS 12.9* 7.3  --  7.6 6.6 5.2  HGB 8.4* 6.2* 8.1* 7.7* 6.7* 8.6*  HCT 25.4* 19.5* 24.1* 23.3* 20.9* 25.4*  MCV 89.8 93.3  --  91.0 93.7 93.4  PLT 236 110*  --  95* 64* 59*   Basic Metabolic Panel:  Recent Labs Lab 01/13/17 0447 01/14/17 0811 01/15/17 0800 01/16/17 0514 01/17/17 0435  NA 140 145 142 137 137  K 5.8* 3.9 4.0 3.3* 3.4*  CL 101 103 103 96* 95*  CO2 14* 23 21* 26 24  GLUCOSE 99 125* 108* 82 75  BUN 132* 69* 77* 35* 43*  CREATININE 20.48* 12.10* 12.97* 7.40* 8.47*  CALCIUM 8.0* 9.0 9.0 8.8* 8.5*   GFR: Estimated Creatinine Clearance: 7.4 mL/min (A) (by C-G formula based on SCr of 8.47 mg/dL (H)). Liver Function Tests:  Recent Labs Lab 01/12/17 1824 01/13/17 0447 01/14/17 0811 01/17/17 0435  AST 37 36 38 25  ALT  15 17 18 15   ALKPHOS 93 109 88 77  BILITOT 0.8 1.0 1.4* 2.0*  PROT 6.3* 6.8 6.1* 5.9*  ALBUMIN 2.8* 3.0* 2.7* 2.7*    Recent Labs Lab 01/12/17 1824  LIPASE 64*    Recent Labs Lab 01/12/17 1824 01/13/17 0447 01/14/17 0811  AMMONIA 311* 205* 27   Coagulation Profile:  Recent Labs Lab 01/12/17 1824 01/13/17 0825  INR 1.19 1.24   Cardiac Enzymes:  Recent Labs Lab 01/12/17 1824  TROPONINI <0.03   BNP (last 3 results) No results for input(s): PROBNP in the last 8760  hours. HbA1C: No results for input(s): HGBA1C in the last 72 hours. CBG:  Recent Labs Lab 01/16/17 0422 01/16/17 0730 01/16/17 1133 01/16/17 1615 01/17/17 0607  GLUCAP 82 75 85 80 71   Sepsis Labs:  Recent Labs Lab 01/12/17 1725 01/12/17 2126  LATICACIDVEN 2.40* 1.9    Recent Results (from the past 240 hour(s))  Wound or Superficial Culture     Status: None   Collection Time: 01/12/17  5:50 PM  Result Value Ref Range Status   Specimen Description LEG RIGHT  Final   Special Requests Immunocompromised  Final   Gram Stain   Final    MODERATE WBC PRESENT, PREDOMINANTLY PMN ABUNDANT GRAM NEGATIVE RODS Performed at [facility], [place] [address] [place], [place] [address]    Culture ABUNDANT KLEBSIELLA OXYTOCA  Final   Report Status 01/15/2017 FINAL  Final   Organism ID, Bacteria KLEBSIELLA OXYTOCA  Final      Susceptibility   Klebsiella oxytoca - MIC*    AMPICILLIN RESISTANT Resistant     CEFAZOLIN >=64 RESISTANT Resistant     CEFEPIME <=1 SENSITIVE Sensitive     CEFTAZIDIME <=1 SENSITIVE Sensitive     CEFTRIAXONE <=1 SENSITIVE Sensitive     CIPROFLOXACIN <=0.25 SENSITIVE Sensitive     GENTAMICIN <=1 SENSITIVE Sensitive     IMIPENEM <=0.25 SENSITIVE Sensitive     TRIMETH/SULFA <=20 SENSITIVE Sensitive     AMPICILLIN/SULBACTAM 8 SENSITIVE Sensitive     PIP/TAZO <=4 SENSITIVE Sensitive     Extended ESBL NEGATIVE Sensitive     * ABUNDANT KLEBSIELLA OXYTOCA  Culture, blood  (Routine X 2) w Reflex to ID Panel     Status: Abnormal   Collection Time: 01/12/17  6:24 PM  Result Value Ref Range Status   Specimen Description BLOOD LEFT HAND  Final   Special Requests Blood Culture adequate volume  Final   Culture  Setup Time   Final    GRAM POSITIVE COCCI IN CLUSTERS Gram Stain Report Called to,Read Back By and Verified With: MOTLEY J. AT 1320 BY THOMPSON S. ON [id number] CRITICAL RESULT CALLED TO, READ BACK BY AND VERIFIED WITH: J. MOTLEY, RN ([facility]) AT 1808 ON 01/13/17 BY C. JESSUP, MLT.    Culture (A)  Final    STAPHYLOCOCCUS SPECIES (COAGULASE NEGATIVE) THE SIGNIFICANCE OF ISOLATING THIS ORGANISM FROM A SINGLE SET OF BLOOD CULTURES WHEN MULTIPLE SETS ARE DRAWN IS UNCERTAIN. PLEASE NOTIFY THE MICROBIOLOGY DEPARTMENT WITHIN ONE WEEK IF SPECIATION AND SENSITIVITIES ARE REQUIRED. Performed at [facility], [place] [address] [place], [place] [address]    Report Status 01/15/2017 FINAL  Final  Blood Culture ID Panel (Reflexed)     Status: Abnormal   Collection Time: 01/12/17  6:24 PM  Result Value Ref Range Status   Enterococcus species NOT DETECTED NOT DETECTED Final   Listeria monocytogenes NOT DETECTED NOT DETECTED Final   Staphylococcus species DETECTED (A) NOT DETECTED Final    Comment: Methicillin (oxacillin) resistant coagulase negative staphylococcus. Possible blood culture contaminant (unless isolated from more than one blood culture draw or clinical case suggests pathogenicity). No antibiotic treatment is indicated for blood  culture contaminants. CRITICAL RESULT CALLED TO, READ BACK BY AND VERIFIED WITH: J. MOTLEY, RN (APH) ON AT 1808 ON 01/13/17 BY C. JESSUP, MLT.    Staphylococcus aureus NOT DETECTED NOT DETECTED Final   Methicillin resistance DETECTED (A) NOT DETECTED Final    Comment: CRITICAL RESULT CALLED TO, READ BACK BY AND VERIFIED WITH: J. MOTLEY,  RN ([facility]) AT 1808 ON 01/13/17 BY C. JESSUP, MLT.    Streptococcus species NOT DETECTED NOT DETECTED Final     Streptococcus agalactiae NOT DETECTED NOT DETECTED Final   Streptococcus pneumoniae NOT DETECTED NOT DETECTED Final   Streptococcus pyogenes NOT DETECTED NOT DETECTED Final   Acinetobacter baumannii NOT DETECTED NOT DETECTED Final   Enterobacteriaceae species NOT DETECTED NOT DETECTED Final   Enterobacter cloacae complex NOT DETECTED NOT DETECTED Final   Escherichia coli NOT DETECTED NOT DETECTED Final   Klebsiella oxytoca NOT DETECTED NOT DETECTED Final   Klebsiella pneumoniae NOT DETECTED NOT DETECTED Final   Proteus species NOT DETECTED NOT DETECTED Final   Serratia marcescens NOT DETECTED NOT DETECTED Final   Haemophilus influenzae NOT DETECTED NOT DETECTED Final   Neisseria meningitidis NOT DETECTED NOT DETECTED Final   Pseudomonas aeruginosa NOT DETECTED NOT DETECTED Final   Candida albicans NOT DETECTED NOT DETECTED Final   Candida glabrata NOT DETECTED NOT DETECTED Final   Candida krusei NOT DETECTED NOT DETECTED Final   Candida parapsilosis NOT DETECTED NOT DETECTED Final   Candida tropicalis NOT DETECTED NOT DETECTED Final    Comment: Performed at [facility], [place] [address] [place], [place] [address]  MRSA PCR Screening     Status: None   Collection Time: 01/13/17 12:09 AM  Result Value Ref Range Status   MRSA by PCR NEGATIVE NEGATIVE Final    Comment:        The GeneXpert MRSA Assay (FDA approved for NASAL specimens only), is one component of a comprehensive MRSA colonization surveillance program. It is not intended to diagnose MRSA infection nor to guide or monitor treatment for MRSA infections.   Culture, blood (Routine X 2) w Reflex to ID Panel     Status: None (Preliminary result)   Collection Time: 01/13/17  4:47 AM  Result Value Ref Range Status   Specimen Description BLOOD PICC LINE  Final   Special Requests   Final    BOTTLES DRAWN AEROBIC AND ANAEROBIC Blood Culture results may not be optimal due to an excessive volume of blood received in  culture bottles AEROBIC BOTTLE Blood Culture results may not be optimal due to an inadequate volume of blood received in culture  bottles ANAEROBIC BOTTLE    Culture NO GROWTH 4 DAYS  Final   Report Status PENDING  Incomplete     Radiology Studies: US Paracentesis  Result Date: 01/15/2017 INDICATION: Cirrhosis, ascites EXAM: ULTRASOUND GUIDED THERAPEUTIC PARACENTESIS MEDICATIONS: None. COMPLICATIONS: None immediate. PROCEDURE: Procedure, benefits, and risks of procedure were discussed with patient. Written informed consent for procedure was obtained. Time out protocol followed. Adequate collection of ascites localized by ultrasound in RIGHT lower quadrant. Skin prepped and draped in usual sterile fashion. Skin and soft tissues anesthetized with 10 mL of 1% lidocaine. 5 [place] Yueh catheter placed into peritoneal cavity. 4.9 L of yellow colored ascitic fluid aspirated by vacuum bottle suction. Procedure tolerated well by patient without immediate complication. FINDINGS: As above IMPRESSION: Successful ultrasound-guided paracentesis yielding 4.9 liters of peritoneal fluid. Electronically Signed   By: Lavonia Dana M.D.   On: 01/15/2017 14:53   Dg Chest Port 1 View  Result Date: 01/16/2017 CLINICAL DATA:  Respiratory failure EXAM: PORTABLE CHEST 1 VIEW COMPARISON:  January 15, 2017 FINDINGS: Endotracheal tube and nasogastric tube have been removed. No pneumothorax. Central catheter tip is in the superior vena cava, unchanged. There is mild left base atelectasis. There is trace interstitial edema. No consolidation. There  is cardiomegaly with pulmonary venous hypertension. No adenopathy. No bone lesions. There is aortic atherosclerosis. IMPRESSION: Central catheter tip in superior vena cava. No pneumothorax. Evidence of a degree of congestive heart failure with mild interstitial edema. Stable cardiac silhouette. Mild left base atelectasis noted. There is aortic atherosclerosis. Electronically Signed   By: Lowella Grip III M.D.   On: 01/16/2017 07:52    Scheduled Meds: . chlorhexidine gluconate (MEDLINE KIT)  15 mL Mouth Rinse BID  . Chlorhexidine Gluconate Cloth  6 each Topical Daily  . epoetin (EPOGEN/PROCRIT) injection  10,000 Units Subcutaneous Q M,W,F-HD  . lactulose  30 g Per Tube TID  . mouth rinse  15 mL Mouth Rinse QID  . [START ON 01/18/2017] midodrine  10 mg Oral Q M,W,F-HD  . pantoprazole  40 mg Oral Q0600  . sodium chloride flush  10-40 mL Intracatheter Q12H  . sodium chloride flush  3 mL Intravenous Q12H   Continuous Infusions: . sodium chloride Stopped (01/13/17 0948)  . sodium chloride    . sodium chloride    . cefTRIAXone (ROCEPHIN)  IV Stopped (01/16/17 1100)  . fentaNYL infusion INTRAVENOUS Stopped (01/15/17 1029)     LOS: 5 days   Leiloni Smithers, MD FACP Hospitalist.   If 7PM-7AM, please contact night-coverage [email or web address] Password [email or web address] 01/17/2017, 8:10 AM

## 2017-01-17 NOTE — Progress Notes (Signed)
Subjective: Interval History: has complaints of poor appetite. She doesn't have any nausea or vomiting. She feels weak  Objective: Vital signs in last 24 hours: Temp:  [95.2 F (35.1 C)-98.6 F (37 C)] 97.5 F (36.4 C) (06/10 0600) Pulse Rate:  [90-104] 94 (06/10 0600) Resp:  [8-19] 16 (06/10 0600) BP: (118-147)/(58-87) 127/66 (06/10 0600) SpO2:  [91 %-100 %] 99 % (06/10 0600) Weight:  [63 kg (138 lb 14.2 oz)] 63 kg (138 lb 14.2 oz) (06/10 0500) Weight change: -4.3 kg (-9 lb 7.7 oz)  Intake/Output from previous day: 06/09 0701 - 06/10 0700 In: 945 [P.O.:560; Blood:335; IV Piggyback:50] Out: -  Intake/Output this shift: Total I/O In: -  Out: 20 [Urine:20]  General appearance: alert, cooperative and no distress Resp: clear to auscultation bilaterally Cardio: regular rate and rhythm Extremities: Patient has dressing bilaterally because of her cellulitis.  Lab Results:  Recent Labs  01/16/17 0514 01/17/17 0435  WBC 8.9 7.0  HGB 6.7* 8.6*  HCT 20.9* 25.4*  PLT 64* 59*   BMET:  Recent Labs  01/16/17 0514 01/17/17 0435  NA 137 137  K 3.3* 3.4*  CL 96* 95*  CO2 26 24  GLUCOSE 82 75  BUN 35* 43*  CREATININE 7.40* 8.47*  CALCIUM 8.8* 8.5*   No results for input(s): PTH in the last 72 hours. Iron Studies: No results for input(s): IRON, TIBC, TRANSFERRIN, FERRITIN in the last 72 hours.  Studies/Results: US Paracentesis  Result Date: 01/15/2017 INDICATION: Cirrhosis, ascites EXAM: ULTRASOUND GUIDED THERAPEUTIC PARACENTESIS MEDICATIONS: None. COMPLICATIONS: None immediate. PROCEDURE: Procedure, benefits, and risks of procedure were discussed with patient. Written informed consent for procedure was obtained. Time out protocol followed. Adequate collection of ascites localized by ultrasound in RIGHT lower quadrant. Skin prepped and draped in usual sterile fashion. Skin and soft tissues anesthetized with 10 mL of 1% lidocaine. 5 [place] Yueh catheter placed into peritoneal  cavity. 4.9 L of yellow colored ascitic fluid aspirated by vacuum bottle suction. Procedure tolerated well by patient without immediate complication. FINDINGS: As above IMPRESSION: Successful ultrasound-guided paracentesis yielding 4.9 liters of peritoneal fluid. Electronically Signed   By: Lavonia Dana M.D.   On: 01/15/2017 14:53   Dg Chest Port 1 View  Result Date: 01/16/2017 CLINICAL DATA:  Respiratory failure EXAM: PORTABLE CHEST 1 VIEW COMPARISON:  January 15, 2017 FINDINGS: Endotracheal tube and nasogastric tube have been removed. No pneumothorax. Central catheter tip is in the superior vena cava, unchanged. There is mild left base atelectasis. There is trace interstitial edema. No consolidation. There is cardiomegaly with pulmonary venous hypertension. No adenopathy. No bone lesions. There is aortic atherosclerosis. IMPRESSION: Central catheter tip in superior vena cava. No pneumothorax. Evidence of a degree of congestive heart failure with mild interstitial edema. Stable cardiac silhouette. Mild left base atelectasis noted. There is aortic atherosclerosis. Electronically Signed   By: Lowella Grip III M.D.   On: 01/16/2017 07:52    I have reviewed the patient's current medications.  Assessment/Plan: Problem #1 end-stage renal disease: She is status post hemodialysis on Friday. Presently complains of nausea. Patient possibly with uremia since she misses her dialysis as an outpatient. Problem #2 anemia: Her hemoglobin is low. She is status post blood transfusion. This is the comminution of bleeding and also lack of Epogen Problem #3 metabolic bone disease: Her calcium is a range Problem #4 cellulitis of her legs: Presently she is on antibiotics Problem #5 liver cirrhosis with ascites Problem #6 noncompliance with her dialysis. This is a  recurrent problem. Patient misses her dialysis from a week to 2 weeks and end up in the hospital. Problem #7 hypotension: Her blood pressure is reasonable this  morning Plan: We'll dialyze patient today 2] we'll continue his Epogen 3] we'll continue to advise patient to be compliant with her dialysis treatment 4] we'll check a renal panel and CBC in the morning   LOS: 5 days   Karrisa Didio S 01/17/2017,9:53 AM

## 2017-01-18 LAB — RENAL FUNCTION PANEL
ANION GAP: 14 (ref 5–15)
Albumin: 2.5 g/dL — ABNORMAL LOW (ref 3.5–5.0)
BUN: 24 mg/dL — AB (ref 6–20)
CHLORIDE: 95 mmol/L — AB (ref 101–111)
CO2: 26 mmol/L (ref 22–32)
Calcium: 8.2 mg/dL — ABNORMAL LOW (ref 8.9–10.3)
Creatinine, Ser: 5.47 mg/dL — ABNORMAL HIGH (ref 0.44–1.00)
GFR calc Af Amer: 10 mL/min — ABNORMAL LOW (ref >60)
GFR calc non Af Amer: 9 mL/min — ABNORMAL LOW (ref >60)
GLUCOSE: 75 mg/dL (ref 65–99)
POTASSIUM: 3.1 mmol/L — AB (ref 3.5–5.1)
Phosphorus: 6.6 mg/dL — ABNORMAL HIGH (ref 2.5–4.6)
Sodium: 135 mmol/L (ref 135–145)

## 2017-01-18 LAB — CULTURE, BLOOD (ROUTINE X 2): CULTURE: NO GROWTH

## 2017-01-18 LAB — CBC WITH DIFFERENTIAL/PLATELET
Basophils Absolute: 0 10*3/uL (ref 0.0–0.1)
Basophils Relative: 1 %
EOS PCT: 4 %
Eosinophils Absolute: 0.3 10*3/uL (ref 0.0–0.7)
HEMATOCRIT: 24.2 % — AB (ref 36.0–46.0)
Hemoglobin: 7.8 g/dL — ABNORMAL LOW (ref 12.0–15.0)
LYMPHS ABS: 0.7 10*3/uL (ref 0.7–4.0)
LYMPHS PCT: 10 %
MCH: 30.8 pg (ref 26.0–34.0)
MCHC: 32.2 g/dL (ref 30.0–36.0)
MCV: 95.7 fL (ref 78.0–100.0)
MONO ABS: 0.7 10*3/uL (ref 0.1–1.0)
Monocytes Relative: 11 %
NEUTROS ABS: 4.9 10*3/uL (ref 1.7–7.7)
Neutrophils Relative %: 75 %
PLATELETS: 53 10*3/uL — AB (ref 150–400)
RBC: 2.53 MIL/uL — ABNORMAL LOW (ref 3.87–5.11)
RDW: 16.8 % — AB (ref 11.5–15.5)
WBC: 6.6 10*3/uL (ref 4.0–10.5)

## 2017-01-18 MED ORDER — SEVELAMER CARBONATE 800 MG PO TABS
1600.0000 mg | ORAL_TABLET | Freq: Three times a day (TID) | ORAL | Status: DC
Start: 2017-01-18 — End: 2017-01-19
  Filled 2017-01-18 (×3): qty 2

## 2017-01-18 MED ORDER — POTASSIUM CHLORIDE CRYS ER 20 MEQ PO TBCR
40.0000 meq | EXTENDED_RELEASE_TABLET | Freq: Two times a day (BID) | ORAL | Status: DC
  Administered 2017-01-18: 40 meq via ORAL
  Filled 2017-01-18 (×3): qty 2

## 2017-01-18 MED ORDER — SEVELAMER CARBONATE 800 MG PO TABS: 800.0000 mg | ORAL_TABLET | ORAL | Status: DC | PRN

## 2017-01-18 NOTE — Evaluation (Signed)
Physical Therapy Evaluation Patient Details Name: Wanda [name] MRN: [id number] DOB: 10-09-1975 Today's Date: 01/18/2017   History of Present Illness  Wanda [name] is a 41yo white female who comes to [facility] on 6/5 p being found unresponsive at home. Upon arrival pt found to be with hepatic encephalopathy, sepsis from cellulitis, and hypothermic. Pt had missed dialysis for about 2W. Pt required ventillation while admitted, extubated on 6/8. She is being treated for BLE wounds from knees to ankles. PMH: NASH, ESRD on HD, medical non-compliance, and chronic thrombocytopenia. NH3 is now drown from 300s to 27. At baseline pt AMB household distances only s AD.   Clinical Impression  Pt admitted with above diagnosis. Pt currently with functional limitations due to the deficits listed below (see "PT Problem List"). Upon entry, the patient is received semirecumbent in bed,significant other present who assists with history regarding recent navigation through the healthcare system over the past 2 years. Pt remains with mild AMS, mild flat affect, and hypointeractive with responses that are intermittently absent, delayed, or incoherent. Functional mobility assessment demonstrates heavy weakness, the pt now requiring near maximal effort to perform bed mobility, modA for transfers c RW, and unable to perform gait, whereas the patient performs these independently at baseline. Significant other explains her progressive weakness as being related to fluid accumulation, however PT educated him and patient about the progressive decline in strength documented over the past 2-3 years of admissions and how the pt appears to not be bouncing back between hospitalizations. Pt will benefit from skilled PT intervention to increase independence and safety with basic mobility in preparation for discharge to the venue listed below.       Follow Up Recommendations Home health PT;Outpatient PT    Equipment Recommendations  None  recommended by PT    Recommendations for Other Services       Precautions / Restrictions Precautions Precautions: Fall Restrictions Weight Bearing Restrictions: No      Mobility  Bed Mobility Overal bed mobility: Needs Assistance Bed Mobility: Supine to Sit     Supine to sit: Supervision     General bed mobility comments: very heavy effort and use of rail required ot perform  Transfers Overall transfer level: Needs assistance Equipment used: Rolling walker (2 wheeled) (pedRW ) Transfers: Sit to/from Stand Sit to Stand: From elevated surface;Mod assist         General transfer comment: performed 3 times, pt with difficulty coming fully upright with posterolateral (right) lean. limited UE strength to support self on RW>   Ambulation/Gait Ambulation/Gait assistance:  (takes one step but is unable to due to RLE pain)              Stairs            Wheelchair Mobility    Modified Rankin (Stroke Patients Only)       Balance Overall balance assessment: Needs assistance Sitting-balance support: Bilateral upper extremity supported;Feet unsupported Sitting balance-Leahy Scale: Fair     Standing balance support: During functional activity;Bilateral upper extremity supported Standing balance-Leahy Scale: Zero Standing balance comment: durign transfers,unabel to establish indep standing with RW                             Pertinent Vitals/Pain Pain Assessment: Faces (attempted several time, pt c AMS still, not logically responding to questioning. ) Faces Pain Scale: Hurts little more Pain Location: c/o R calf pain, related to wounds Pain Intervention(s):  Limited activity within patient's tolerance;RN gave pain meds during session    [place] expects to be discharged to:: Private residence Living Arrangements: Spouse/significant other Available Help at Discharge: Family;Available PRN/intermittently (significant other  available 24/4; works 3 12s, Fri,. Sat, sun ) Type of Home: House Home Access: Level entry     Home Layout: One level Home Equipment: Walker - standard;Bedside commode      Prior Function Level of Independence: Needs assistance   Gait / Transfers Assistance Needed: Minimal use of SW, just AMB in house with adequate balance, limited fatigue due to fluid fluctuation.   ADL's / Homemaking Assistance Needed: significant other assists with dressing and bathing as needed.   Comments: does not regularly access the community     Hand Dominance   Dominant Hand: Right    Extremity/Trunk Assessment   Upper Extremity Assessment Upper Extremity Assessment: Generalized weakness    Lower Extremity Assessment Lower Extremity Assessment: Generalized weakness    Cervical / Trunk Assessment Cervical / Trunk Assessment:  (trunk very weak, difficulty sitting up in bed)  Communication   Communication: No difficulties  Cognition Arousal/Alertness:  (drowsy ) Behavior During Therapy:  (mildly flat, somewhat tearful at end, unclear why) Overall Cognitive Status: Difficult to assess                                 General Comments: incoherent or no response at times; appears confused by questoning at time.       General Comments      Exercises     Assessment/Plan    PT Assessment Patient needs continued PT services  PT Problem List Decreased strength;Decreased activity tolerance;Decreased balance;Decreased mobility       PT Treatment Interventions Gait training;Functional mobility training;Therapeutic activities;Therapeutic exercise;Balance training;Patient/family education    PT Goals (Current goals can be found in the Care Plan section)  Acute Rehab PT Goals Patient Stated Goal: regain strength and independence PT Goal Formulation: With patient/family Time For Goal Achievement: 02/01/17 Potential to Achieve Goals: Fair    Frequency Min 2X/week   Barriers to  discharge   husband works 12hour shifts 3 d weekly, there is no other support for patient at this time.     Co-evaluation               AM-PAC PT "6 Clicks" Daily Activity  Outcome Measure Difficulty turning over in bed (including adjusting bedclothes, sheets and blankets)?: A Lot Difficulty moving from lying on back to sitting on the side of the bed? : A Lot Difficulty sitting down on and standing up from a chair with arms (e.g., wheelchair, bedside commode, etc,.)?: Total Help needed moving to and from a bed to chair (including a wheelchair)?: Total Help needed walking in hospital room?: Total Help needed climbing 3-5 steps with a railing? : Total 6 Click Score: 8    End of Session Equipment Utilized During Treatment: Gait belt Activity Tolerance: Patient limited by fatigue;No increased pain Patient left: in bed;with nursing/sitter in room;with family/visitor present;with call bell/phone within reach;with bed alarm set Nurse Communication: Mobility status PT Visit Diagnosis: Unsteadiness on feet (R26.81);Muscle weakness (generalized) (M62.81);Other abnormalities of gait and mobility (R26.89)    Time: 2585-2778 PT Time Calculation (min) (ACUTE ONLY): 30 min   Charges:   PT Evaluation $PT Eval High Complexity: 1 Procedure PT Treatments $Therapeutic Activity: 8-22 mins   PT G Codes:  12:20 PM, 01/18/17 [name], PT, DPT Physical Therapist - [place] [phone] (Office)    Sequoya Hogsett C 01/18/2017, 12:15 PM

## 2017-01-18 NOTE — Progress Notes (Signed)
PROGRESS NOTE    Wanda [name]  JSE:[id number] DOB: 04/07/1976 DOA: 01/12/2017 PCP: [name], MD    Brief Narrative: 41 yo with ESRD on HD, NASH, non compliance, thrombocytopenia, admitted for AMS, elevated NH3 to 300's, nasal bleeding, intubated for airway protection and lower extremities cellulitis. She has been intubated for airways protection. She had EGD showing no GI bleed, and HD yesterday. She is more alert today, blinking her eyes to answer questions. She had some bleeding on her lower leg, but it eventually stopped. She was extubated yesterday, and did well. She also had LVP without bleeding complication. She is alert and converses meaningfully today. She refuses lactulose, and doesn't want to take the Zosyn. Her antibiotics was changed to IV Rocephin.  She is eating and has no complained today.  Her Hb dropped a little, but no evidence of bleeding anymore.   Assessment & Plan:   Active Problems:   Liver cirrhosis secondary to nonalcoholic steatohepatitis (NASH) (HCC)   Thrombocytopenia (HCC)   End stage renal disease on dialysis (HCC)   Bullous skin disease   Altered mental status   Cellulitis   Encounter for orogastric (OG) tube placement   Hepatic encephalopathy ([place])   Uremia 1. Altered mental status- Likely due to hepatic encephalopathy. She will need lactulose at some point. NH3 level is now normal at 27. Will start her renal diet today. Her NH3 level remained low. She refuses lactulose.  2. End-stage renal disease-patient missed hemodialysis for past 2 weeks,potassium is 5.7, BUN 120, creatinine 20.34. Now K is normal. Continue with routine dialysis.  3. Cellulitis/wound infection-patient has bullous skin disease, presenting with cellulitis and infected bulla of the right lower extremity.Was on Van/Zosyn to Rocephin at 2 g per day.  4. Chronic thrombocytopenia- platelet count is adequate, but maybe dysfx due to uremia. This should improve with  continued dialysis support.  5. Liver cirrhosis,secondary to [place]- liver enzymes are stable, blood pressure stable. Will give oral meds as required. S/p 4.6 L of paracentesis. She did not bleed with the procedure.  6 Intubation: extubated. Pulmonary consultation appreciated.  7. Nasal bleeding; resolved. 8. Anemia: still low but stable.    DVT prophylaxis:SCD. Code Status:FULL CODE.  Family Communication:Son updated.  Disposition Plan:TBD.  Consultants:GI, renal, PCCM.    Antimicrobials: Anti-infectives    Start     Dose/Rate Route Frequency Ordered Stop   01/16/17 0900  cefTRIAXone (ROCEPHIN) 2 g in dextrose 5 % 50 mL IVPB     2 g 100 mL/hr over 30 Minutes Intravenous Every 24 hours 01/16/17 0723     01/15/17 1200  vancomycin (VANCOCIN) IVPB 750 mg/150 ml premix  Status:  Discontinued     750 mg 150 mL/hr over 60 Minutes Intravenous Every M-W-F (Hemodialysis) 01/14/17 0758 01/16/17 0753   01/13/17 1800  vancomycin (VANCOCIN) IVPB 750 mg/150 ml premix     750 mg 150 mL/hr over 60 Minutes Intravenous  Once 01/13/17 1046 01/13/17 1630   01/13/17 1000  piperacillin-tazobactam (ZOSYN) IVPB 3.375 g  Status:  Discontinued     3.375 g 12.5 mL/hr over 240 Minutes Intravenous Every 12 hours 01/13/17 0741 01/16/17 0723   01/12/17 1630  vancomycin (VANCOCIN) IVPB 1000 mg/200 mL premix     1,000 mg 200 mL/hr over 60 Minutes Intravenous  Once 01/12/17 1627 01/13/17 0133   01/12/17 1630  piperacillin-tazobactam (ZOSYN) IVPB 3.375 g     3.375 g 100 mL/hr over 30 Minutes Intravenous  Once 01/12/17 1627 01/13/17 0104  Subjective:  Did n't feel as well today, but no specific complaints.   Objective: Vitals:   01/17/17 1500 01/17/17 2338 01/18/17 0506 01/18/17 1444  BP: 137/73 (!) 121/52 (!) 121/50 (!) 128/57  Pulse: (!) 101 (!) 103 95 (!) 106  Resp: 16 17 20 18   Temp:   99.1 F (37.3 C) 98.6 F (37 C)  TempSrc:   Oral   SpO2: 96% 100% 100% 99%  Weight:       Height:        Intake/Output Summary (Last 24 hours) at 01/18/17 1800 Last data filed at 01/18/17 1300  Gross per 24 hour  Intake              420 ml  Output                0 ml  Net              420 ml   Filed Weights   01/16/17 0400 01/17/17 0500 01/17/17 1110  Weight: 63.4 kg (139 lb 12.4 oz) 63 kg (138 lb 14.2 oz) 63.2 kg (139 lb 5.3 oz)    Examination:  General exam: Appears calm and comfortable  Respiratory system: Clear to auscultation. Respiratory effort normal. Cardiovascular system: S1 & S2 heard, RRR. No JVD, murmurs, rubs, gallops or clicks. No pedal edema. Gastrointestinal system: Abdomen is nondistended, soft and nontender. No organomegaly or masses felt. Normal bowel sounds heard. Central nervous system: Alert and oriented. No focal neurological deficits. Extremities: Symmetric 5 x 5 power. Skin: No rashes, lesions or ulcers Psychiatry: Judgement and insight appear normal. Mood & affect appropriate.   Data Reviewed: I have personally reviewed following labs and imaging studies  CBC:  Recent Labs Lab 01/14/17 0811 01/14/17 1948 01/15/17 0800 01/16/17 0514 01/17/17 0435 01/18/17 0424  WBC 9.4  --  10.1 8.9 7.0 6.6  NEUTROABS 7.3  --  7.6 6.6 5.2 4.9  HGB 6.2* 8.1* 7.7* 6.7* 8.6* 7.8*  HCT 19.5* 24.1* 23.3* 20.9* 25.4* 24.2*  MCV 93.3  --  91.0 93.7 93.4 95.7  PLT 110*  --  95* 64* 59* 53*   Basic Metabolic Panel:  Recent Labs Lab 01/14/17 0811 01/15/17 0800 01/16/17 0514 01/17/17 0435 01/18/17 0424  NA 145 142 137 137 135  K 3.9 4.0 3.3* 3.4* 3.1*  CL 103 103 96* 95* 95*  CO2 23 21* 26 24 26   GLUCOSE 125* 108* 82 75 75  BUN 69* 77* 35* 43* 24*  CREATININE 12.10* 12.97* 7.40* 8.47* 5.47*  CALCIUM 9.0 9.0 8.8* 8.5* 8.2*  PHOS  --   --   --   --  6.6*   GFR: Estimated Creatinine Clearance: 11.4 mL/min (A) (by C-G formula based on SCr of 5.47 mg/dL (H)). Liver Function Tests:  Recent Labs Lab 01/12/17 1824 01/13/17 0447  01/14/17 0811 01/17/17 0435 01/18/17 0424  AST 37 36 38 25  --   ALT 15 17 18 15   --   ALKPHOS 93 109 88 77  --   BILITOT 0.8 1.0 1.4* 2.0*  --   PROT 6.3* 6.8 6.1* 5.9*  --   ALBUMIN 2.8* 3.0* 2.7* 2.7* 2.5*    Recent Labs Lab 01/12/17 1824  LIPASE 64*    Recent Labs Lab 01/12/17 1824 01/13/17 0447 01/14/17 0811 01/17/17 0900  AMMONIA 311* 205* 27 22   Coagulation Profile:  Recent Labs Lab 01/12/17 1824 01/13/17 0825  INR 1.19 1.24   Cardiac Enzymes:  Recent Labs  Lab 01/12/17 1824  TROPONINI <0.03   CBG:  Recent Labs Lab 01/16/17 0730 01/16/17 1133 01/16/17 1615 01/17/17 0607 01/17/17 1122  GLUCAP 75 85 80 71 76    Recent Labs Lab 01/12/17 1725 01/12/17 2126  LATICACIDVEN 2.40* 1.9    Recent Results (from the past 240 hour(s))  Wound or Superficial Culture     Status: None   Collection Time: 01/12/17  5:50 PM  Result Value Ref Range Status   Specimen Description LEG RIGHT  Final   Special Requests Immunocompromised  Final   Gram Stain   Final    MODERATE WBC PRESENT, PREDOMINANTLY PMN ABUNDANT GRAM NEGATIVE RODS Performed at [facility], [place] [address], [place], [place] [address]    Culture ABUNDANT KLEBSIELLA OXYTOCA  Final   Report Status 01/15/2017 FINAL  Final   Organism ID, Bacteria KLEBSIELLA OXYTOCA  Final      Susceptibility   Klebsiella oxytoca - MIC*    AMPICILLIN RESISTANT Resistant     CEFAZOLIN >=64 RESISTANT Resistant     CEFEPIME <=1 SENSITIVE Sensitive     CEFTAZIDIME <=1 SENSITIVE Sensitive     CEFTRIAXONE <=1 SENSITIVE Sensitive     CIPROFLOXACIN <=0.25 SENSITIVE Sensitive     GENTAMICIN <=1 SENSITIVE Sensitive     IMIPENEM <=0.25 SENSITIVE Sensitive     TRIMETH/SULFA <=20 SENSITIVE Sensitive     AMPICILLIN/SULBACTAM 8 SENSITIVE Sensitive     PIP/TAZO <=4 SENSITIVE Sensitive     Extended ESBL NEGATIVE Sensitive     * ABUNDANT KLEBSIELLA OXYTOCA  Culture, blood (Routine X 2) w Reflex to ID Panel      Status: Abnormal   Collection Time: 01/12/17  6:24 PM  Result Value Ref Range Status   Specimen Description BLOOD LEFT HAND  Final   Special Requests Blood Culture adequate volume  Final   Culture  Setup Time   Final    GRAM POSITIVE COCCI IN CLUSTERS Gram Stain Report Called to,Read Back By and Verified With: MOTLEY J. AT 1320 BY THOMPSON S. ON [id number] CRITICAL RESULT CALLED TO, READ BACK BY AND VERIFIED WITH: J. MOTLEY, RN ([facility]) AT 1808 ON 01/13/17 BY C. JESSUP, MLT.    Culture (A)  Final    STAPHYLOCOCCUS SPECIES (COAGULASE NEGATIVE) THE SIGNIFICANCE OF ISOLATING THIS ORGANISM FROM A SINGLE SET OF BLOOD CULTURES WHEN MULTIPLE SETS ARE DRAWN IS UNCERTAIN. PLEASE NOTIFY THE MICROBIOLOGY DEPARTMENT WITHIN ONE WEEK IF SPECIATION AND SENSITIVITIES ARE REQUIRED. Performed at [facility], [place] [address], [place], [place] [address]    Report Status 01/15/2017 FINAL  Final  Blood Culture ID Panel (Reflexed)     Status: Abnormal   Collection Time: 01/12/17  6:24 PM  Result Value Ref Range Status   Enterococcus species NOT DETECTED NOT DETECTED Final   Listeria monocytogenes NOT DETECTED NOT DETECTED Final   Staphylococcus species DETECTED (A) NOT DETECTED Final    Comment: Methicillin (oxacillin) resistant coagulase negative staphylococcus. Possible blood culture contaminant (unless isolated from more than one blood culture draw or clinical case suggests pathogenicity). No antibiotic treatment is indicated for blood  culture contaminants. CRITICAL RESULT CALLED TO, READ BACK BY AND VERIFIED WITH: J. MOTLEY, RN ([facility]) ON AT 1808 ON 01/13/17 BY C. JESSUP, MLT.    Staphylococcus aureus NOT DETECTED NOT DETECTED Final   Methicillin resistance DETECTED (A) NOT DETECTED Final    Comment: CRITICAL RESULT CALLED TO, READ BACK BY AND VERIFIED WITH: J. MOTLEY, RN ([facility]) AT 1808 ON 01/13/17  BY C. JESSUP, MLT.    Streptococcus species NOT DETECTED NOT DETECTED Final   Streptococcus agalactiae NOT  DETECTED NOT DETECTED Final   Streptococcus pneumoniae NOT DETECTED NOT DETECTED Final   Streptococcus pyogenes NOT DETECTED NOT DETECTED Final   Acinetobacter baumannii NOT DETECTED NOT DETECTED Final   Enterobacteriaceae species NOT DETECTED NOT DETECTED Final   Enterobacter cloacae complex NOT DETECTED NOT DETECTED Final   Escherichia coli NOT DETECTED NOT DETECTED Final   Klebsiella oxytoca NOT DETECTED NOT DETECTED Final   Klebsiella pneumoniae NOT DETECTED NOT DETECTED Final   Proteus species NOT DETECTED NOT DETECTED Final   Serratia marcescens NOT DETECTED NOT DETECTED Final   Haemophilus influenzae NOT DETECTED NOT DETECTED Final   Neisseria meningitidis NOT DETECTED NOT DETECTED Final   Pseudomonas aeruginosa NOT DETECTED NOT DETECTED Final   Candida albicans NOT DETECTED NOT DETECTED Final   Candida glabrata NOT DETECTED NOT DETECTED Final   Candida krusei NOT DETECTED NOT DETECTED Final   Candida parapsilosis NOT DETECTED NOT DETECTED Final   Candida tropicalis NOT DETECTED NOT DETECTED Final    Comment: Performed at [facility], [place] [address], [place], [place] [address]  MRSA PCR Screening     Status: None   Collection Time: 01/13/17 12:09 AM  Result Value Ref Range Status   MRSA by PCR NEGATIVE NEGATIVE Final    Comment:        The GeneXpert MRSA Assay (FDA approved for NASAL specimens only), is one component of a comprehensive MRSA colonization surveillance program. It is not intended to diagnose MRSA infection nor to guide or monitor treatment for MRSA infections.   Culture, blood (Routine X 2) w Reflex to ID Panel     Status: None   Collection Time: 01/13/17  4:47 AM  Result Value Ref Range Status   Specimen Description BLOOD PICC LINE  Final   Special Requests   Final    BOTTLES DRAWN AEROBIC AND ANAEROBIC Blood Culture results may not be optimal due to an excessive volume of blood received in culture bottles AEROBIC BOTTLE Blood Culture results may  not be optimal due to an inadequate volume of blood received in culture  bottles ANAEROBIC BOTTLE    Culture NO GROWTH 5 DAYS  Final   Report Status 01/18/2017 FINAL  Final     Radiology Studies: No results found.  Scheduled Meds: . chlorhexidine gluconate (MEDLINE KIT)  15 mL Mouth Rinse BID  . Chlorhexidine Gluconate Cloth  6 each Topical Daily  . epoetin (EPOGEN/PROCRIT) injection  10,000 Units Subcutaneous Q M,W,F-HD  . lactulose  30 g Oral TID  . mouth rinse  15 mL Mouth Rinse QID  . midodrine  10 mg Oral Q M,W,F-HD  . pantoprazole  40 mg Oral Q0600  . potassium chloride  40 mEq Oral BID  . sevelamer carbonate  1,600 mg Oral TID WC  . sodium chloride flush  10-40 mL Intracatheter Q12H  . sodium chloride flush  3 mL Intravenous Q12H   Continuous Infusions: . sodium chloride Stopped (01/13/17 0948)  . sodium chloride    . sodium chloride    . cefTRIAXone (ROCEPHIN)  IV Stopped (01/18/17 0956)  . fentaNYL infusion INTRAVENOUS Stopped (01/15/17 1029)     LOS: 6 days   Akashdeep Chuba, MD FACP Hospitalist.   If 7PM-7AM, please contact night-coverage [email or web address] Password [email or web address] 01/18/2017, 6:00 PM

## 2017-01-18 NOTE — Care Management (Signed)
    Durable Medical Equipment        Start     Ordered   01/18/17 0854  For home use only DME Hospital bed  Once    Question Answer Comment  Patient has (list medical condition): ESRD   The above medical condition requires: Patient requires the ability to reposition frequently   Head must be elevated greater than: 30 degrees   Bed type Semi-electric      01/18/17 0853

## 2017-01-18 NOTE — Care Management Note (Addendum)
Case Management Note  Patient Details  Name: Wanda [name] MRN: [id number] Date of Birth: 1976-06-06  Subjective/Objective:                  Pt from home, lives with boyfriend, she requires some assistance with bathing and dressing pta. Pt provides very little information, at times responds inappropriately, tell me to call her boyfriend and she agree's with what his wants her to do. Boyfriend, Wanda [name], called and reports he works weekends so that he is available during the week to take pt to HD. He says someday's he gets her up and dressed to go and then she refuses or goes back to bed, he does not urge her to go when she does this. He takes her to all of her appointments, he communicates how she is doing to her family who he says she does not speak to. He says some days she does not respond to him during conversation. He has been working to get a hospital bed through [facility]. He is agreeable to [facility] services at DC. They have used [facility] in the past and would like them again. He is aware HH has 48hrs to make first visit. He has been dressing her legs himself pta. He communicates no other needs at this time.   Action/Plan: Romualdo Bolk, [facility], rep aware of referrals and will obtain pt info from chart hospital bed will be delivered prior to DC. CM will follow to DC.   Expected Discharge Date:    01/19/2017              Expected Discharge Plan:  [place]  In-House Referral:  Clinical Social Work  Discharge planning Services  CM Consult  Post Acute Care Choice:  Home Health, Durable Medical Equipment Choice offered to:  Patient, Spouse  DME Arranged:  Hospital bed, wheelchair DME Agency:  [place]:  RN, PT [facility] Agency:  [facility] [place]  Status of Service:  In process, will continue to follow  Addendum 1515:  pt also needs WC, order placed and referral updated with [facility] rep.   Sherald Barge, RN 01/18/2017, 2:37 PM

## 2017-01-18 NOTE — Progress Notes (Signed)
Subjective: Interval History: Patient states that she's feeling much better. She doesn't have any nausea or vomiting. Denies also any difficulty breathing. States she feels weak.  Objective: Vital signs in last 24 hours: Temp:  [98.5 F (36.9 C)-99.1 F (37.3 C)] 99.1 F (37.3 C) (06/11 0506) Pulse Rate:  [90-103] 95 (06/11 0506) Resp:  [10-20] 20 (06/11 0506) BP: (121-145)/(50-87) 121/50 (06/11 0506) SpO2:  [96 %-100 %] 100 % (06/11 0506) Weight:  [63.2 kg (139 lb 5.3 oz)] 63.2 kg (139 lb 5.3 oz) (06/10 1110) Weight change: 0.2 kg (7.1 oz)  Intake/Output from previous day: 06/10 0701 - 06/11 0700 In: 460 [P.O.:460] Out: 2027 [Urine:20] Intake/Output this shift: Total I/O In: 60 [P.O.:60] Out: -   General appearance: alert, cooperative and no distress Resp: clear to auscultation bilaterally Cardio: regular rate and rhythm Extremities: Patient has dressing bilaterally because of her cellulitis.  Lab Results:  Recent Labs  01/17/17 0435 01/18/17 0424  WBC 7.0 6.6  HGB 8.6* 7.8*  HCT 25.4* 24.2*  PLT 59* 53*   BMET:   Recent Labs  01/17/17 0435 01/18/17 0424  NA 137 135  K 3.4* 3.1*  CL 95* 95*  CO2 24 26  GLUCOSE 75 75  BUN 43* 24*  CREATININE 8.47* 5.47*  CALCIUM 8.5* 8.2*   No results for input(s): PTH in the last 72 hours. Iron Studies: No results for input(s): IRON, TIBC, TRANSFERRIN, FERRITIN in the last 72 hours.  Studies/Results: No results found.  I have reviewed the patient's current medications.  Assessment/Plan: Problem #1 end-stage renal disease: She is status post hemodialysis Yesterday. Patient is asymptomatic. Her potassium is 3.1 low. Problem #2 anemia: Her hemoglobin is low. She is status post blood transfusion. Her hemoglobin has declined slightly. Problem #3 metabolic bone disease: Her calcium is a range but her phosphorus is 6.6 high. Problem #4 cellulitis of her legs: Presently she is on antibiotics Problem #5 liver cirrhosis  with ascites Problem #6 noncompliance with her dialysis. This is a recurrent problem. Patient misses her dialysis from a week to 2 weeks and end up in the hospital. Problem #7 hypotension: Her blood pressure is stable. She is on Midodrine 10 mg by mouth 3 times a day Plan: 1] We'll dialyze patient tomorrow 2] we'll continue his Epogen 10,000 units IV after each dialysis 3] we'll give her KCl 40 mEq by mouth 1 dose 4] we'll check a renal panel and CBC in the morning 5] we'll start her on Renvella and 800 mg 2 tablets by mouth 3 times a day with meals and one with snack   LOS: 6 days   Mycala Warshawsky S 01/18/2017,11:01 AM

## 2017-01-18 NOTE — Care Management (Signed)
    Durable Medical Equipment        Start     Ordered   01/18/17 1519  For home use only DME standard manual wheelchair with seat cushion  Once    Comments:  Patient suffers from ESRD which impairs their ability to perform daily activities like ambulating in the home.  A walker will not resolve  issue with performing activities of daily living. A wheelchair will allow patient to safely perform daily activities. Patient can safely propel the wheelchair in the home or has a caregiver who can provide assistance.  Accessories: elevating leg rests (ELRs), wheel locks, extensions and anti-tippers.   01/18/17 1518   01/18/17 0854  For home use only DME Hospital bed  Once    Question Answer Comment  Patient has (list medical condition): ESRD   The above medical condition requires: Patient requires the ability to reposition frequently   Head must be elevated greater than: 30 degrees   Bed type Semi-electric      01/18/17 0853

## 2017-01-18 NOTE — Progress Notes (Signed)
    Subjective: No abdominal pain. No N/V. Not much of an appetite. Denies confusion. Doesn't want to take lactulose.   Objective: Vital signs in last 24 hours: Temp:  [98.5 F (36.9 C)-99.1 F (37.3 C)] 99.1 F (37.3 C) (06/11 0506) Pulse Rate:  [90-103] 95 (06/11 0506) Resp:  [10-20] 20 (06/11 0506) BP: (121-148)/(50-93) 121/50 (06/11 0506) SpO2:  [96 %-100 %] 100 % (06/11 0506) Weight:  [139 lb 5.3 oz (63.2 kg)] 139 lb 5.3 oz (63.2 kg) (06/10 1110) Last BM Date: 01/17/17 General:   Alert and oriented, flat affect, sallow-appearing  Head:  Normocephalic and atraumatic. Eyes:  No icterus, sclera clear. Conjuctiva pink.  Abdomen:  Bowel sounds present, soft, mild TTP lower abdomen, protruding umbilical hernia  Msk:  Symmetrical without gross deformities. Normal posture. Extremities:  With trace bilateral lower extremity edema, dressings to bilateral lower extremities . Neurologic:  Alert and  oriented x4, negative asterixis    Intake/Output from previous day: 06/10 0701 - 06/11 0700 In: 460 [P.O.:460] Out: 2027 [Urine:20] Intake/Output this shift: No intake/output data recorded.  Lab Results:  Recent Labs  01/16/17 0514 01/17/17 0435 01/18/17 0424  WBC 8.9 7.0 6.6  HGB 6.7* 8.6* 7.8*  HCT 20.9* 25.4* 24.2*  PLT 64* 59* 53*   BMET  Recent Labs  01/16/17 0514 01/17/17 0435 01/18/17 0424  NA 137 137 135  K 3.3* 3.4* 3.1*  CL 96* 95* 95*  CO2 26 24 26   GLUCOSE 82 75 75  BUN 35* 43* 24*  CREATININE 7.40* 8.47* 5.47*  CALCIUM 8.8* 8.5* 8.2*   LFT  Recent Labs  01/17/17 0435 01/18/17 0424  PROT 5.9*  --   ALBUMIN 2.7* 2.5*  AST 25  --   ALT 15  --   ALKPHOS 77  --   BILITOT 2.0*  --     Assessment: 41 year old female with decompensated NASH cirrhosis, ESRD on dialysis with non-compliance, admitted with mental status changes, uremia, cellulitis, epistaxis. Clinically improved from a GI standpoint. Overall prognosis is poor. Patient declining  scheduled lactulose dosing and only wants prn; per Dr. Oneida Alar' notes, she has taken 7.5 ml at home prn. Hospital bed request for home use has been started by Care Management. Will follow peripherally as she has returned to baseline for GI issues.   Plan: Hospital bed as outpatient for home use Supportive care  Will follow peripherally Will arrange outpatient follow-up with our practice   Annitta Needs, PhD, ANP-BC [facility]    LOS: 6 days    01/18/2017, 7:58 AM

## 2017-01-18 NOTE — Care Management Important Message (Signed)
Important Message  Patient Details  Name: Wanda [name] MRN: [id number] Date of Birth: 1976-05-10   Medicare Important Message Given:  Yes    Sherald Barge, RN 01/18/2017, 2:43 PM

## 2017-01-19 LAB — CBC WITH DIFFERENTIAL/PLATELET
Basophils Absolute: 0 10*3/uL (ref 0.0–0.1)
Basophils Relative: 1 %
EOS PCT: 5 %
Eosinophils Absolute: 0.3 10*3/uL (ref 0.0–0.7)
HEMATOCRIT: 24.7 % — AB (ref 36.0–46.0)
Hemoglobin: 8 g/dL — ABNORMAL LOW (ref 12.0–15.0)
LYMPHS PCT: 15 %
Lymphs Abs: 0.9 10*3/uL (ref 0.7–4.0)
MCH: 31 pg (ref 26.0–34.0)
MCHC: 32.4 g/dL (ref 30.0–36.0)
MCV: 95.7 fL (ref 78.0–100.0)
MONO ABS: 0.5 10*3/uL (ref 0.1–1.0)
MONOS PCT: 8 %
NEUTROS ABS: 4.3 10*3/uL (ref 1.7–7.7)
Neutrophils Relative %: 72 %
PLATELETS: 54 10*3/uL — AB (ref 150–400)
RBC: 2.58 MIL/uL — ABNORMAL LOW (ref 3.87–5.11)
RDW: 17.5 % — AB (ref 11.5–15.5)
WBC: 6 10*3/uL (ref 4.0–10.5)

## 2017-01-19 LAB — RENAL FUNCTION PANEL
Albumin: 2.5 g/dL — ABNORMAL LOW (ref 3.5–5.0)
Anion gap: 15 (ref 5–15)
BUN: 36 mg/dL — AB (ref 6–20)
CHLORIDE: 95 mmol/L — AB (ref 101–111)
CO2: 24 mmol/L (ref 22–32)
Calcium: 8.1 mg/dL — ABNORMAL LOW (ref 8.9–10.3)
Creatinine, Ser: 7.49 mg/dL — ABNORMAL HIGH (ref 0.44–1.00)
GFR calc Af Amer: 7 mL/min — ABNORMAL LOW (ref >60)
GFR calc non Af Amer: 6 mL/min — ABNORMAL LOW (ref >60)
GLUCOSE: 79 mg/dL (ref 65–99)
POTASSIUM: 3.2 mmol/L — AB (ref 3.5–5.1)
Phosphorus: 7.8 mg/dL — ABNORMAL HIGH (ref 2.5–4.6)
Sodium: 134 mmol/L — ABNORMAL LOW (ref 135–145)

## 2017-01-19 LAB — HEPATITIS B SURFACE ANTIGEN: Hepatitis B Surface Ag: NEGATIVE

## 2017-01-19 MED ORDER — MIDODRINE HCL 10 MG PO TABS: 10.0000 mg | ORAL_TABLET | ORAL | 1 refills | Status: DC

## 2017-01-19 MED ORDER — PANTOPRAZOLE SODIUM 40 MG PO TBEC: 40.0000 mg | DELAYED_RELEASE_TABLET | Freq: Every day | ORAL | 1 refills | Status: AC

## 2017-01-19 MED ORDER — PENTAFLUOROPROP-TETRAFLUOROETH EX AERO
INHALATION_SPRAY | CUTANEOUS | Status: AC
Start: 2017-01-19 — End: 2017-01-19
  Administered 2017-01-19: 1 via TOPICAL
  Filled 2017-01-19: qty 103.5

## 2017-01-19 MED ORDER — CHLORHEXIDINE GLUCONATE 0.12% ORAL RINSE (MEDLINE KIT): 15.0000 mL | Freq: Two times a day (BID) | OROMUCOSAL | 0 refills | Status: AC

## 2017-01-19 MED ORDER — AMOXICILLIN-POT CLAVULANATE 875-125 MG PO TABS: 1.0000 | ORAL_TABLET | Freq: Two times a day (BID) | ORAL | Status: DC

## 2017-01-19 MED ORDER — AMOXICILLIN-POT CLAVULANATE 500-125 MG PO TABS: 1.0000 | ORAL_TABLET | Freq: Every day | ORAL | Status: DC

## 2017-01-19 MED ORDER — CHLORHEXIDINE GLUCONATE CLOTH 2 % EX PADS: 6.0000 | MEDICATED_PAD | Freq: Every day | CUTANEOUS | 1 refills | Status: DC

## 2017-01-19 MED ORDER — EPOETIN ALFA 10000 UNIT/ML IJ SOLN
INTRAMUSCULAR | Status: AC
  Administered 2017-01-19: 10000 [IU] via SUBCUTANEOUS
  Filled 2017-01-19: qty 1

## 2017-01-19 MED ORDER — AMOXICILLIN-POT CLAVULANATE 500-125 MG PO TABS: 1.0000 | ORAL_TABLET | Freq: Every day | ORAL | 0 refills | Status: DC

## 2017-01-19 NOTE — Progress Notes (Signed)
Reviewed discharge instructions with patient and significant other at bedside. Given copy of AVS with home mediations next due marked. Discussed importance of follow-up with PCP and GI as well as going to dialysis treatments. Patient nodded head and significant other verbalized understanding of instructions. Home health arranged per CM with [place]. [facility] to call them before delivering home equipment. Provided [facility] contact information for any questions/concerns. PICC line d/c'd per Sharyn Blitz, RN. Pressure dressing clean, dry and intact. Pt and significant other aware of need to keep pressure dressing in place for 24 hours and monitor site/notify MD of any bleeding. Patient's significant other verbalized understanding of instructions via teachback. Pt in stable condition awaiting discharge home. Donavan Foil, RN

## 2017-01-19 NOTE — Plan of Care (Signed)
Problem: Education: Goal: Knowledge of [facility] Education information/materials will improve Outcome: Progressing Discussed education related to plan of care and safety plan with patient. Pt nods head and requires reinforcement of education.   Problem: Safety: Goal: Ability to remain free from injury will improve Outcome: Progressing Discussed fall prevention/safety plan with patient. bedalarm on for safey this am, call light and personal items within reach, nonslip socks in place. Instructed patient to call for assistance and not attempt getting up on her own.

## 2017-01-19 NOTE — Progress Notes (Signed)
Subjective: Interval History: Patient is seen on dialysis. Her appetite is improving but still complains of weakness.  Objective: Vital signs in last 24 hours: Temp:  [98.1 F (36.7 C)-99 F (37.2 C)] 98.1 F (36.7 C) (06/12 0900) Pulse Rate:  [93-107] 93 (06/12 0945) Resp:  [16-20] 16 (06/12 0900) BP: (108-128)/(54-68) 121/68 (06/12 0945) SpO2:  [96 %-100 %] 99 % (06/12 0900) Weight change:   Intake/Output from previous day: 06/11 0701 - 06/12 0700 In: 300 [P.O.:300] Out: -  Intake/Output this shift: Total I/O In: 120 [P.O.:120] Out: -   General appearance: alert, cooperative and no distress Resp: clear to auscultation bilaterally Cardio: regular rate and rhythm Extremities: Patient has dressing bilaterally because of her cellulitis.  Lab Results:  Recent Labs  01/18/17 0424 01/19/17 0654  WBC 6.6 6.0  HGB 7.8* 8.0*  HCT 24.2* 24.7*  PLT 53* 54*   BMET:   Recent Labs  01/18/17 0424 01/19/17 0654  NA 135 134*  K 3.1* 3.2*  CL 95* 95*  CO2 26 24  GLUCOSE 75 79  BUN 24* 36*  CREATININE 5.47* 7.49*  CALCIUM 8.2* 8.1*   No results for input(s): PTH in the last 72 hours. Iron Studies: No results for input(s): IRON, TIBC, TRANSFERRIN, FERRITIN in the last 72 hours.  Studies/Results: No results found.  I have reviewed the patient's current medications.  Assessment/Plan: Problem #1 end-stage renal disease: Patient is presently on dialysis. Problem #2 anemia: Her hemoglobin is low. She is status post blood transfusion. Her hemoglobin is 8 and stable. Patient gets Epogen on dialysis. Problem #3 metabolic bone disease: Her calcium is a range but her phosphorus is 7.8 which is high. Her binders were initiated yesterday. Problem #4 cellulitis of her legs: Presently she is on antibiotics Problem #5 liver cirrhosis with ascites Problem #6 noncompliance with her dialysis Problem #7 hypotension: Her blood pressure is stable. She is on Midodrine 10 mg by mouth 3  times a day Plan: 1] We'll  continuous dialysis. We'll try to move about 2 L if her blood pressure tolerates. 2] we'll check her renal panel and CBC in the morning  LOS: 7 days   Chrysta Fulcher S 01/19/2017,10:18 AM

## 2017-01-19 NOTE — Progress Notes (Signed)
Patient refused lactulose and potassium supplement this am. K+ 3.2. Text-paged Dr. Marin Comment to notify. Also requested order to d/c PICC line for discharge home. Home health arranged per CM. Patient's significant other at bedside states MD and CM addressed his questions/concerns regarding discharge. No further concerns at this time. Donavan Foil, RN

## 2017-01-19 NOTE — Care Management Note (Signed)
Case Management Note  Patient Details  Name: Wanda [name] MRN: [id number] Date of Birth: 1976/06/18  Expected Discharge Date:  01/19/17               Expected Discharge Plan:  [name]  In-House Referral:  Clinical Social Work  Discharge planning Services  CM Consult  Post Acute Care Choice:  Home Health, Durable Medical Equipment Choice offered to:  Patient, Spouse  DME Arranged:  Hospital bed, Wheelchair manual DME Agency:  [place]:  RN, PT [facility] Agency:  [place]  Status of Service:  Completed, signed off  If discussed at [place] of Stay Meetings, dates discussed:  01/19/2017  Additional Comments: Pt discharging home today. [facility] rep, Romualdo Bolk, aware and will obtain orders from chart. DME has been delivered to pt home. Pt's S/O, [name], will transport home. He is aware HH has 48hrs to make first visit.    Sherald Barge, RN 01/19/2017, 12:50 PM

## 2017-01-19 NOTE — Procedures (Signed)
    HEMODIALYSIS TREATMENT NOTE:  3.5 hour heparin-free dialysis completed via right upper arm AVF (16g/antegrade). Goal met: 2 liters removed without interruption in ultrafiltration.  Hemodynamically stable throughout HD session.  All blood was returned and hemostasis was achieved in 10 minutes.  Rockwell Alexandria, RN, CDN

## 2017-01-19 NOTE — Progress Notes (Signed)
Patient to be discharged today. Patient's significant other at bedside this afternoon expressed concerns regarding discharge. Text-paged Dr. Marin Comment to notify. Donavan Foil, RN

## 2017-01-19 NOTE — Discharge Summary (Signed)
Physician Discharge Summary  Wanda [name] CVE:[id number] DOB: Jul 15, 1976 DOA: 01/12/2017  PCP: [name], MD  Admit date: 01/12/2017 Discharge date: 01/19/2017  Admitted From: Home.  Disposition: Home.   Recommendations for Outpatient Follow-up:  1. Follow up with PCP in 1-2 weeks 2. Follow up with GI as scheduled.  3.   Home Health: Yes.  OT/PT/RN. Equipment/Devices: Hospital bed. Discharge Condition: alert, conversing.  Intermittently confused.  Stable Hb.  No SOB.  CODE STATUS:FULL CODE.  Diet recommendation: Renal diet.   Brief/Interim Summary: Patient was admitted by Dr Darrick Meigs on January 12, 2017 for altered mental status.  As per H and P:  " Wanda [name]  is a 41 y.o. female, With history of ESRD on hemodialysis, liver cirrhosis due to [name], history of noncompliance, chronic thrombocytopenia today was brought to ED with altered mental status patient has not had dialysis for past 2 weeks. Home health found patient lying in bed and unresponsive. EMS was called. As per ED notes patient's boyfriend was in easy regarding direct answers he seemed to indicate patient was ambulatory and in normal state of health yesterday.  In ED patient was found to be hypothermic, significantly elevated ammonia 311, elevated BUN/creatinine 120/20.34. Patient continues to be obtunded, responds with moaning to sternal rub. PICC line has been placed. Nephrology Dr. Justin Mend  at [facility] was consulted by ED physician, and he recommended patient to stay at [facility] for hemodialysis in a.m.  No other history obtainable due to patient's altered mental status.  HOSPITAL COURSE:  41 yo with ESRD on HD, NASH, non compliance, thrombocytopenia, admitted for AMS, elevated NH3 to 300's, nasal bleeding, intubated for airway protection and lower extremities cellulitis. She has been intubated for airways protection. She had EGD showing no GI bleed.  She was transfuse 2 units first, and subsequently  transfused another unit of PRBC.  It was felt that she had uremia, and that her platelets were not working well.  She was promptly dialyzed after 2 units of platelet transfusions.  She stopped bleeding.  After EGD, her PPI drip and octreotide were discontinued.  For her cellulitis, she was originally started on IV Van/Zosyn.  There was some bleeding on the ulcer of her leg, which stopped as well.  She was seen in consultation with Dr Luan Pulling, and he help guided extubation, which she did well..  Lactulose was given both thru the oral tube and thru enema.  She subsequently extubated and was alert, and was able to start taking oral.  She did not want to continue Zosyn, and did not want to take [place].  So Zosyn was changed to Rocephin 2 grams per day, and lactullose was stopped.  Her follow up NH3 were done twice, and remained normal.  She continued to have regular dialysis, and continued to improved, despite my original impression that she would not have made it thru this hospitalization.  She continue to improve and is now ready to be discharge.  She was recommended not to miss her dialysis, and she will follow up with Dr Oneida Alar and her PCP upon discharge.  She will continue with Dr Chilton Greathouse for her routine dialysis.  It should be noted that during this hospitalization, she did have a LVP and 4.6 liters of ascites was removed, with no bleeding complication. She is now discharge with HH, RN, PT, and a hospital bed.  I suspect she will be readmitted, but hopeful that she continue with dialysis and be compliant.  She is discharged to home with her boyfriend.  Thank you for allowing me to participate in her care.  Her prognosis is very poor.    Discharge Diagnoses:  Active Problems:   Liver cirrhosis secondary to nonalcoholic steatohepatitis (NASH) (HCC)   Thrombocytopenia (HCC)   End stage renal disease on dialysis (HCC)   Bullous skin disease   Altered mental status   Cellulitis   Encounter for  orogastric (OG) tube placement   Hepatic encephalopathy ([place])   Uremia    Discharge Instructions  Discharge Instructions    Diet - low sodium heart healthy    Complete by:  As directed    Discharge instructions    Complete by:  As directed    Do not miss dialysis.  Follow up with Dr Oneida Alar and your PCP.  Take you meds as instructed.   Increase activity slowly    Complete by:  As directed      Allergies as of 01/19/2017      Reactions   Iohexol Rash   Pt states rash after last CT. Pt needs pre-meds   Lasix [furosemide] Other (See Comments)   "doesn't work"   Latex Itching      Medication List    STOP taking these medications   lidocaine 2 % solution Commonly known as:  XYLOCAINE     TAKE these medications   amoxicillin-clavulanate 500-125 MG tablet Commonly known as:  AUGMENTIN Take 1 tablet (500 mg total) by mouth daily.   augmented betamethasone dipropionate 0.05 % cream Commonly known as:  DIPROLENE-AF APPLY TO THE AFFECTED AREAS ON LEGS TWICE DAILY AS NEEDED (NOT FACE, GROIN, OR UNDER ARMS)   chlorhexidine gluconate (MEDLINE KIT) 0.12 % solution Commonly known as:  PERIDEX 15 mLs by Mouth Rinse route 2 (two) times daily.   clotrimazole 1 % cream Commonly known as:  LOTRIMIN Apply 1 application topically 3 (three) times daily.   Darbepoetin Alfa 60 MCG/0.3ML Sosy injection Commonly known as:  ARANESP Inject 0.3 mLs (60 mcg total) into the vein every Thursday with hemodialysis. What changed:  when to take this   lactulose 10 GM/15ML solution Commonly known as:  CHRONULAC Take 15-30 mLs (10-20 g total) by mouth 3 (three) times daily. TAKE 15-30 ml BY MOUTH three times a day   lidocaine-prilocaine cream Commonly known as:  EMLA Apply 1 application topically as needed (for dialysis treatments).   LORazepam 1 MG tablet Commonly known as:  ATIVAN Take 1 mg by mouth 2 (two) times daily as needed for anxiety (and/or back spasms).   midodrine 5 MG  tablet Commonly known as:  PROAMATINE TAKE TWO (2) TABLETS BY MOUTH DAILY. TAKE ONLY ON DIALYSIS DAYS ON TUESDAYS, THURDAYS, AND SATURDAYS (SOMETIME TAKES TREATMENTS ON FRIDAY IN What changed:  Another medication with the same name was removed. Continue taking this medication, and follow the directions you see here.   omeprazole 20 MG capsule Commonly known as:  PRILOSEC 1 po every morning 30 minutes prior to your first meal. What changed:  how much to take  how to take this  when to take this  additional instructions   Oxycodone HCl 10 MG Tabs Take 0.5 tablets (5 mg total) by mouth 3 (three) times daily as needed. 1 PO TID AS NEEDED FOR PAIN What changed:  how much to take  additional instructions   pantoprazole 40 MG tablet Commonly known as:  PROTONIX Take 1 tablet (40 mg total) by mouth daily at 6 (six) AM.  Start taking on:  01/20/2017   RENVELA 800 MG tablet Generic drug:  sevelamer carbonate Take 1600 mg by mouth 3 times daily with meals. Take 800 mg by mouth with snacks.   sucralfate 1 GM/10ML suspension Commonly known as:  CARAFATE Take 1 g by mouth 4 (four) times daily -  with meals and at bedtime.            Durable Medical Equipment        Start     Ordered   01/18/17 1519  For home use only DME standard manual wheelchair with seat cushion  Once    Comments:  Patient suffers from ESRD which impairs their ability to perform daily activities like ambulating in the home.  A walker will not resolve  issue with performing activities of daily living. A wheelchair will allow patient to safely perform daily activities. Patient can safely propel the wheelchair in the home or has a caregiver who can provide assistance.  Accessories: elevating leg rests (ELRs), wheel locks, extensions and anti-tippers.   01/18/17 1518   01/18/17 0854  For home use only DME Hospital bed  Once    Question Answer Comment  Patient has (list medical condition): ESRD   The above  medical condition requires: Patient requires the ability to reposition frequently   Head must be elevated greater than: 30 degrees   Bed type Semi-electric      01/18/17 0853     Follow-up Information    [facility] Follow [facility].   Contact information: [address] [place] [address] [phone]        [name], MD. Schedule an appointment as soon as possible for a visit.   Specialty:  Nurse Practitioner Why:  Follow up with your primary care provider in 1-2 weeks.  Contact information: [place] [address] [phone]        Danie Binder, MD. Schedule an appointment as soon as possible for a visit in 1 week(s).   Specialty:  Gastroenterology Why:  follow-up with Dr. Oneida Alar in 1-2 weeks Contact information: [address] [place] [address] [phone]          Allergies  Allergen Reactions  . Iohexol Rash    Pt states rash after last CT. Pt needs pre-meds  . Lasix [Furosemide] Other (See Comments)    "doesn't work"  . Latex Itching    Consultations:  Renal, GI, PCCM.    Procedures/Studies: Dg Chest 2 View  Result Date: 12/22/2016 CLINICAL DATA:  Dyspnea EXAM: CHEST  2 VIEW COMPARISON:  12/13/2016 chest radiograph. FINDINGS: Stable cardiomediastinal silhouette with mild cardiomegaly and aortic atherosclerosis. No pneumothorax. No pleural effusion. Borderline mild pulmonary edema. IMPRESSION: Borderline mild congestive heart failure. Aortic atherosclerosis. Electronically Signed   By: Ilona Sorrel M.D.   On: 12/22/2016 21:19   Dg Tibia/fibula Left  Result Date: 12/22/2016 CLINICAL DATA:  Pain and swelling of the left lower leg.  Blisters. EXAM: LEFT TIBIA AND FIBULA - 2 VIEW COMPARISON:  None. FINDINGS: No acute fracture nor bone destruction of the left tibia nor fibula. Joint spaces are maintained. Cutaneous nodular densities consistent with reported soft tissue blisters are seen with soft tissue  induration potentially representing cellulitis or possibly changes of venous insufficiency. IMPRESSION: 1. Negative for acute fracture nor bone destruction. Joint dislocations. 2. Soft tissue induration with cutaneous masslike abnormalities consistent with reported history of blisters. Cellulitis or venous insufficiency might account for the subcutaneous soft tissue  induration. Electronically Signed   By: Ashley Royalty M.D.   On: 12/22/2016 22:29   Dg Tibia/fibula Right  Result Date: 12/22/2016 CLINICAL DATA:  Diffuse blistering of the lower extremity EXAM: RIGHT TIBIA AND FIBULA - 2 VIEW COMPARISON:  None. FINDINGS: There is subcutaneous soft tissue induration of the right leg possibly from venous insufficiency or cellulitis. Cutaneous masslike abnormalities are identified along the mid to lower leg consistent with reported history soft tissue blisters. No frank bone destruction is identified. No acute fracture is noted. IMPRESSION: Negative for acute fracture or bone destruction. Soft tissue edema with cutaneous masslike abnormalities of the leg consistent with reported soft tissue blistering. Electronically Signed   By: Ashley Royalty M.D.   On: 12/22/2016 22:27   Ct Head Wo Contrast  Result Date: 01/13/2017 CLINICAL DATA:  Altered mental status.  Ventilator dependence. EXAM: CT HEAD WITHOUT CONTRAST TECHNIQUE: Contiguous axial images were obtained from the base of the skull through the vertex without intravenous contrast. COMPARISON:  09/29/2016 FINDINGS: Brain: There is no evidence for acute hemorrhage, hydrocephalus, mass lesion, or abnormal extra-axial fluid collection. No definite CT evidence for acute infarction. Vascular: No hyperdense vessel or unexpected calcification. Skull: No evidence for fracture. No worrisome lytic or sclerotic lesion. Sinuses/Orbits: Mucosal disease in the ethmoid air cells and maxillary sinus has progressed in the interval. Visualized portions of the globes and intraorbital fat  are unremarkable. Other: None. IMPRESSION: 1. No acute intracranial abnormality. 2. Interval development of paranasal sinus disease, nonspecific in the setting of intubation. Electronically Signed   By: Misty Stanley M.D.   On: 01/13/2017 11:33   US Paracentesis  Result Date: 01/15/2017 INDICATION: Cirrhosis, ascites EXAM: ULTRASOUND GUIDED THERAPEUTIC PARACENTESIS MEDICATIONS: None. COMPLICATIONS: None immediate. PROCEDURE: Procedure, benefits, and risks of procedure were discussed with patient. Written informed consent for procedure was obtained. Time out protocol followed. Adequate collection of ascites localized by ultrasound in RIGHT lower quadrant. Skin prepped and draped in usual sterile fashion. Skin and soft tissues anesthetized with 10 mL of 1% lidocaine. 5 [place] Yueh catheter placed into peritoneal cavity. 4.9 L of yellow colored ascitic fluid aspirated by vacuum bottle suction. Procedure tolerated well by patient without immediate complication. FINDINGS: As above IMPRESSION: Successful ultrasound-guided paracentesis yielding 4.9 liters of peritoneal fluid. Electronically Signed   By: Lavonia Dana M.D.   On: 01/15/2017 14:53   Dg Chest 1v Repeat Same Day  Result Date: 01/12/2017 CLINICAL DATA:  PICC line placement EXAM: CHEST - 1 VIEW SAME DAY COMPARISON:  1959 hours on the same day FINDINGS: Left-sided PICC line tip projects over the expected location of the proximal right atrium. Pullback approximately 2 cm. Stable cardiomegaly with aortic atherosclerosis and mild CHF. Low lung volumes are noted. No acute nor suspicious osseous lesions. IMPRESSION: 1. Tip of left-sided PICC line tip projects over the proximal right atrium. Pullback approximately 2 cm. 2. Cardiomegaly with aortic atherosclerosis and mild CHF. Electronically Signed   By: Ashley Royalty M.D.   On: 01/12/2017 22:44   Dg Chest Port 1 View  Result Date: 01/16/2017 CLINICAL DATA:  Respiratory failure EXAM: PORTABLE CHEST 1 VIEW  COMPARISON:  January 15, 2017 FINDINGS: Endotracheal tube and nasogastric tube have been removed. No pneumothorax. Central catheter tip is in the superior vena cava, unchanged. There is mild left base atelectasis. There is trace interstitial edema. No consolidation. There is cardiomegaly with pulmonary venous hypertension. No adenopathy. No bone lesions. There is aortic atherosclerosis. IMPRESSION: Central catheter tip in superior vena  cava. No pneumothorax. Evidence of a degree of congestive heart failure with mild interstitial edema. Stable cardiac silhouette. Mild left base atelectasis noted. There is aortic atherosclerosis. Electronically Signed   By: Lowella Grip III M.D.   On: 01/16/2017 07:52   Dg Chest Port 1 View  Result Date: 01/15/2017 CLINICAL DATA:  Respiratory failure EXAM: PORTABLE CHEST 1 VIEW COMPARISON:  01/14/2017 FINDINGS: Cardiac shadow is stable. Endotracheal tube and nasogastric catheter are noted in satisfactory position. The lungs are well aerated bilaterally. Minimal platelike atelectasis is noted in the left base. No new focal abnormality is seen. IMPRESSION: Minimal left basilar atelectasis. No new focal abnormality is noted. Electronically Signed   By: Inez Catalina M.D.   On: 01/15/2017 07:23   Dg Chest Port 1 View  Result Date: 01/14/2017 CLINICAL DATA:  Respiratory failure EXAM: PORTABLE CHEST 1 VIEW COMPARISON:  01/13/2017 FINDINGS: ET tube tip is above the carina. There is a left arm PICC line with tip in the cavoatrial junction. OG tube tip is in the stomach. Aortic atherosclerosis. Normal heart size. Decrease in left pleural effusion and pulmonary vascular congestion. IMPRESSION: 1. Decrease in left pleural effusion and pulmonary vascular congestion. Electronically Signed   By: Kerby Moors M.D.   On: 01/14/2017 09:01   Dg Chest Port 1 View  Result Date: 01/13/2017 CLINICAL DATA:  Status post orogastric tube placement. EXAM: PORTABLE CHEST 1 VIEW COMPARISON:  Chest  x-ray of earlier today. FINDINGS: The orogastric tube tip projects at approximately the level of the pylorus or proximal duodenum. The proximal port projects in the distal gastric body. There are loops of mildly distended gas-filled small bowel in the left mid abdomen. IMPRESSION: Reasonable positioning of the orogastric tube. Electronically Signed   By: David  Martinique M.D.   On: 01/13/2017 09:13   Dg Chest Portable 1 View  Result Date: 01/13/2017 CLINICAL DATA:  Intubation. EXAM: PORTABLE CHEST 1 VIEW COMPARISON:  01/12/2017. FINDINGS: Endotracheal tube tip 1.9 cm above the lower portion of the carina. NG tube tip below left hemidiaphragm. Left PICC line in stable position. Cardiomegaly with pulmonary vascular congestion again noted without interim change. Low lung volumes with mild basilar atelectasis. No pneumothorax. IMPRESSION: 1. Endotracheal tube tip 1.9 cm above the lower portion of the carina. NG tube tip below left hemidiaphragm. Left PICC line stable position. 2. Cardiomegaly with pulmonary vascular congestion again noted. No interim change. 3. Low lung volumes with basilar atelectasis. Electronically Signed   By: Marcello Moores  Register   On: 01/13/2017 06:41   Dg Chest Port 1 View  Result Date: 01/12/2017 CLINICAL DATA:  Altered mentation and wheeze EXAM: PORTABLE CHEST 1 VIEW COMPARISON:  12/22/2016 FINDINGS: Borderline cardiomegaly with aortic atherosclerosis. Slight interval increase in pulmonary vascular congestion consistent mild CHF. No pneumonic consolidation, effusion or pneumothorax. Minimal atelectasis and/or scarring at the right lung base. No acute nor suspicious osseous lesions. IMPRESSION: Interval increase in pulmonary vascular congestion consistent with mild CHF. Stable aortic atherosclerosis Electronically Signed   By: Ashley Royalty M.D.   On: 01/12/2017 17:57   Dg Chest Port 1v Same Day  Result Date: 01/12/2017 CLINICAL DATA:  PICC line placement EXAM: PORTABLE CHEST 1 VIEW COMPARISON:   01/12/2017 FINDINGS: Left upper extremity catheter tip directed cephalad. Stable borderline cardiomegaly. Mild central vascular calcification. No focal consolidation or effusion. Aortic atherosclerosis. IMPRESSION: 1. Left upper extremity catheter tip directed cephalad, possibly within the azygos system. Repositioning suggested 2. Cardiomegaly with slight worsens in central vascular congestion. Electronically Signed  By: Donavan Foil M.D.   On: 01/12/2017 20:22      Subjective: I understand.    Discharge Exam: Vitals:   01/19/17 1245 01/19/17 1300  BP: 126/69 (!) 126/54  Pulse: 86 97  Resp:    Temp:     Vitals:   01/19/17 1145 01/19/17 1215 01/19/17 1245 01/19/17 1300  BP: (!) 98/51 (!) 102/59 126/69 (!) 126/54  Pulse: 83 81 86 97  Resp:      Temp:      TempSrc:      SpO2:      Weight:      Height:        General: Pt is alert, awake, not in acute distress Cardiovascular: RRR, S1/S2 +, no rubs, no gallops Respiratory: CTA bilaterally, no wheezing, no rhonchi Abdominal: Soft, NT, ND, bowel sounds + Extremities: no edema, no cyanosis    The results of significant diagnostics from this hospitalization (including imaging, microbiology, ancillary and laboratory) are listed below for reference.     Microbiology: Recent Results (from the past 240 hour(s))  Wound or Superficial Culture     Status: None   Collection Time: 01/12/17  5:50 PM  Result Value Ref Range Status   Specimen Description LEG RIGHT  Final   Special Requests Immunocompromised  Final   Gram Stain   Final    MODERATE WBC PRESENT, PREDOMINANTLY PMN ABUNDANT GRAM NEGATIVE RODS Performed at [facility], [address], [place], [place] [address]    Culture ABUNDANT KLEBSIELLA OXYTOCA  Final   Report Status 01/15/2017 FINAL  Final   Organism ID, Bacteria KLEBSIELLA OXYTOCA  Final      Susceptibility   Klebsiella oxytoca - MIC*    AMPICILLIN RESISTANT Resistant     CEFAZOLIN >=64 RESISTANT  Resistant     CEFEPIME <=1 SENSITIVE Sensitive     CEFTAZIDIME <=1 SENSITIVE Sensitive     CEFTRIAXONE <=1 SENSITIVE Sensitive     CIPROFLOXACIN <=0.25 SENSITIVE Sensitive     GENTAMICIN <=1 SENSITIVE Sensitive     IMIPENEM <=0.25 SENSITIVE Sensitive     TRIMETH/SULFA <=20 SENSITIVE Sensitive     AMPICILLIN/SULBACTAM 8 SENSITIVE Sensitive     PIP/TAZO <=4 SENSITIVE Sensitive     Extended ESBL NEGATIVE Sensitive     * ABUNDANT KLEBSIELLA OXYTOCA  Culture, blood (Routine X 2) w Reflex to ID Panel     Status: Abnormal   Collection Time: 01/12/17  6:24 PM  Result Value Ref Range Status   Specimen Description BLOOD LEFT HAND  Final   Special Requests Blood Culture adequate volume  Final   Culture  Setup Time   Final    GRAM POSITIVE COCCI IN CLUSTERS Gram Stain Report Called to,Read Back By and Verified With: MOTLEY J. AT 1320 BY THOMPSON S. ON [id number] CRITICAL RESULT CALLED TO, READ BACK BY AND VERIFIED WITH: J. MOTLEY, RN ([facility]) AT 1808 ON 01/13/17 BY C. JESSUP, MLT.    Culture (A)  Final    STAPHYLOCOCCUS SPECIES (COAGULASE NEGATIVE) THE SIGNIFICANCE OF ISOLATING THIS ORGANISM FROM A SINGLE SET OF BLOOD CULTURES WHEN MULTIPLE SETS ARE DRAWN IS UNCERTAIN. PLEASE NOTIFY THE MICROBIOLOGY DEPARTMENT WITHIN ONE WEEK IF SPECIATION AND SENSITIVITIES ARE REQUIRED. Performed at [facility], [place] [address], [place], [place] [address]    Report Status 01/15/2017 FINAL  Final  Blood Culture ID Panel (Reflexed)     Status: Abnormal   Collection Time: 01/12/17  6:24 PM  Result Value Ref Range  Status   Enterococcus species NOT DETECTED NOT DETECTED Final   Listeria monocytogenes NOT DETECTED NOT DETECTED Final   Staphylococcus species DETECTED (A) NOT DETECTED Final    Comment: Methicillin (oxacillin) resistant coagulase negative staphylococcus. Possible blood culture contaminant (unless isolated from more than one blood culture draw or clinical case suggests pathogenicity). No antibiotic  treatment is indicated for blood  culture contaminants. CRITICAL RESULT CALLED TO, READ BACK BY AND VERIFIED WITH: J. MOTLEY, RN (APH) ON AT 1808 ON 01/13/17 BY C. JESSUP, MLT.    Staphylococcus aureus NOT DETECTED NOT DETECTED Final   Methicillin resistance DETECTED (A) NOT DETECTED Final    Comment: CRITICAL RESULT CALLED TO, READ BACK BY AND VERIFIED WITH: J. MOTLEY, RN (APH) AT 1808 ON 01/13/17 BY C. JESSUP, MLT.    Streptococcus species NOT DETECTED NOT DETECTED Final   Streptococcus agalactiae NOT DETECTED NOT DETECTED Final   Streptococcus pneumoniae NOT DETECTED NOT DETECTED Final   Streptococcus pyogenes NOT DETECTED NOT DETECTED Final   Acinetobacter baumannii NOT DETECTED NOT DETECTED Final   Enterobacteriaceae species NOT DETECTED NOT DETECTED Final   Enterobacter cloacae complex NOT DETECTED NOT DETECTED Final   Escherichia coli NOT DETECTED NOT DETECTED Final   Klebsiella oxytoca NOT DETECTED NOT DETECTED Final   Klebsiella pneumoniae NOT DETECTED NOT DETECTED Final   Proteus species NOT DETECTED NOT DETECTED Final   Serratia marcescens NOT DETECTED NOT DETECTED Final   Haemophilus influenzae NOT DETECTED NOT DETECTED Final   Neisseria meningitidis NOT DETECTED NOT DETECTED Final   Pseudomonas aeruginosa NOT DETECTED NOT DETECTED Final   Candida albicans NOT DETECTED NOT DETECTED Final   Candida glabrata NOT DETECTED NOT DETECTED Final   Candida krusei NOT DETECTED NOT DETECTED Final   Candida parapsilosis NOT DETECTED NOT DETECTED Final   Candida tropicalis NOT DETECTED NOT DETECTED Final    Comment: Performed at [facility], [place] [address], [place], [place] [address]  MRSA PCR Screening     Status: None   Collection Time: 01/13/17 12:09 AM  Result Value Ref Range Status   MRSA by PCR NEGATIVE NEGATIVE Final    Comment:        The GeneXpert MRSA Assay (FDA approved for NASAL specimens only), is one component of a comprehensive MRSA colonization surveillance  program. It is not intended to diagnose MRSA infection nor to guide or monitor treatment for MRSA infections.   Culture, blood (Routine X 2) w Reflex to ID Panel     Status: None   Collection Time: 01/13/17  4:47 AM  Result Value Ref Range Status   Specimen Description BLOOD PICC LINE  Final   Special Requests   Final    BOTTLES DRAWN AEROBIC AND ANAEROBIC Blood Culture results may not be optimal due to an excessive volume of blood received in culture bottles AEROBIC BOTTLE Blood Culture results may not be optimal due to an inadequate volume of blood received in culture  bottles ANAEROBIC BOTTLE    Culture NO GROWTH 5 DAYS  Final   Report Status 01/18/2017 FINAL  Final     Labs: BNP (last 3 results)  Recent Labs  05/19/16 1510 01/12/17 1824  BNP 95.0 078.6*   Basic Metabolic Panel:  Recent Labs Lab 01/15/17 0800 01/16/17 0514 01/17/17 0435 01/18/17 0424 01/19/17 0654  NA 142 137 137 135 134*  K 4.0 3.3* 3.4* 3.1* 3.2*  CL 103 96* 95* 95* 95*  CO2 21* _0 GLUCOSE 108*  82 75 75 79  BUN 77* 35* 43* 24* 36*  CREATININE 12.97* 7.40* 8.47* 5.47* 7.49*  CALCIUM 9.0 8.8* 8.5* 8.2* 8.1*  PHOS  --   --   --  6.6* 7.8*   Liver Function Tests:  Recent Labs Lab 01/12/17 1824 01/13/17 0447 01/14/17 0811 01/17/17 0435 01/18/17 0424 01/19/17 0654  AST 37 36 38 25  --   --   ALT _0 --   --   ALKPHOS 93 109 88 77  --   --   BILITOT 0.8 1.0 1.4* 2.0*  --   --   PROT 6.3* 6.8 6.1* 5.9*  --   --   ALBUMIN 2.8* 3.0* 2.7* 2.7* 2.5* 2.5*    Recent Labs Lab 01/12/17 1824  LIPASE 64*    Recent Labs Lab 01/12/17 1824 01/13/17 0447 01/14/17 0811 01/17/17 0900  AMMONIA 311* 205* 27 22   CBC:  Recent Labs Lab 01/15/17 0800 01/16/17 0514 01/17/17 0435 01/18/17 0424 01/19/17 0654  WBC 10.1 8.9 7.0 6.6 6.0  NEUTROABS 7.6 6.6 5.2 4.9 4.3  HGB 7.7* 6.7* 8.6* 7.8* 8.0*  HCT 23.3* 20.9* 25.4* 24.2* 24.7*  MCV 91.0 93.7 93.4 95.7 95.7  PLT 95*  64* 59* 53* 54*   Cardiac Enzymes:  Recent Labs Lab 01/12/17 1824  TROPONINI <0.03   BNP: Invalid input(s): POCBNP CBG:  Recent Labs Lab 01/16/17 0730 01/16/17 1133 01/16/17 1615 01/17/17 0607 01/17/17 1122  GLUCAP 75 85 80 71 76   Urinalysis    Component Value Date/Time   COLORURINE YELLOW 01/12/2017 1650   APPEARANCEUR CLEAR 01/12/2017 1650   LABSPEC 1.013 01/12/2017 1650   PHURINE 6.0 01/12/2017 1650   GLUCOSEU NEGATIVE 01/12/2017 1650   HGBUR SMALL (A) 01/12/2017 1650   BILIRUBINUR NEGATIVE 01/12/2017 1650   KETONESUR NEGATIVE 01/12/2017 1650   PROTEINUR 30 (A) 01/12/2017 1650   UROBILINOGEN 0.2 08/13/2014 2138   NITRITE NEGATIVE 01/12/2017 1650   LEUKOCYTESUR NEGATIVE 01/12/2017 1650   Microbiology Recent Results (from the past 240 hour(s))  Wound or Superficial Culture     Status: None   Collection Time: 01/12/17  5:50 PM  Result Value Ref Range Status   Specimen Description LEG RIGHT  Final   Special Requests Immunocompromised  Final   Gram Stain   Final    MODERATE WBC PRESENT, PREDOMINANTLY PMN ABUNDANT GRAM NEGATIVE RODS Performed at [facility], [place] [address] [place], [place] [address]    Culture ABUNDANT KLEBSIELLA OXYTOCA  Final   Report Status 01/15/2017 FINAL  Final   Organism ID, Bacteria KLEBSIELLA OXYTOCA  Final      Susceptibility   Klebsiella oxytoca - MIC*    AMPICILLIN RESISTANT Resistant     CEFAZOLIN >=64 RESISTANT Resistant     CEFEPIME <=1 SENSITIVE Sensitive     CEFTAZIDIME <=1 SENSITIVE Sensitive     CEFTRIAXONE <=1 SENSITIVE Sensitive     CIPROFLOXACIN <=0.25 SENSITIVE Sensitive     GENTAMICIN <=1 SENSITIVE Sensitive     IMIPENEM <=0.25 SENSITIVE Sensitive     TRIMETH/SULFA <=20 SENSITIVE Sensitive     AMPICILLIN/SULBACTAM 8 SENSITIVE Sensitive     PIP/TAZO <=4 SENSITIVE Sensitive     Extended ESBL NEGATIVE Sensitive     * ABUNDANT KLEBSIELLA OXYTOCA  Culture, blood (Routine X 2) w Reflex to ID Panel     Status:  Abnormal   Collection Time: 01/12/17  6:24 PM  Result Value Ref Range Status   Specimen Description  BLOOD LEFT HAND  Final   Special Requests Blood Culture adequate volume  Final   Culture  Setup Time   Final    GRAM POSITIVE COCCI IN CLUSTERS Gram Stain Report Called to,Read Back By and Verified With: MOTLEY J. AT 1320 BY THOMPSON S. ON [id number] CRITICAL RESULT CALLED TO, READ BACK BY AND VERIFIED WITH: J. MOTLEY, RN (APH) AT 1808 ON 01/13/17 BY C. JESSUP, MLT.    Culture (A)  Final    STAPHYLOCOCCUS SPECIES (COAGULASE NEGATIVE) THE SIGNIFICANCE OF ISOLATING THIS ORGANISM FROM A SINGLE SET OF BLOOD CULTURES WHEN MULTIPLE SETS ARE DRAWN IS UNCERTAIN. PLEASE NOTIFY THE MICROBIOLOGY DEPARTMENT WITHIN ONE WEEK IF SPECIATION AND SENSITIVITIES ARE REQUIRED. Performed at [facility], [place] [address] [place],  [address]    Report Status 01/15/2017 FINAL  Final  Blood Culture ID Panel (Reflexed)     Status: Abnormal   Collection Time: 01/12/17  6:24 PM  Result Value Ref Range Status   Enterococcus species NOT DETECTED NOT DETECTED Final   Listeria monocytogenes NOT DETECTED NOT DETECTED Final   Staphylococcus species DETECTED (A) NOT DETECTED Final    Comment: Methicillin (oxacillin) resistant coagulase negative staphylococcus. Possible blood culture contaminant (unless isolated from more than one blood culture draw or clinical case suggests pathogenicity). No antibiotic treatment is indicated for blood  culture contaminants. CRITICAL RESULT CALLED TO, READ BACK BY AND VERIFIED WITH: J. MOTLEY, RN (APH) ON AT 1808 ON 01/13/17 BY C. JESSUP, MLT.    Staphylococcus aureus NOT DETECTED NOT DETECTED Final   Methicillin resistance DETECTED (A) NOT DETECTED Final    Comment: CRITICAL RESULT CALLED TO, READ BACK BY AND VERIFIED WITH: J. MOTLEY, RN (APH) AT 1808 ON 01/13/17 BY C. JESSUP, MLT.    Streptococcus species NOT DETECTED NOT DETECTED Final   Streptococcus agalactiae NOT DETECTED NOT  DETECTED Final   Streptococcus pneumoniae NOT DETECTED NOT DETECTED Final   Streptococcus pyogenes NOT DETECTED NOT DETECTED Final   Acinetobacter baumannii NOT DETECTED NOT DETECTED Final   Enterobacteriaceae species NOT DETECTED NOT DETECTED Final   Enterobacter cloacae complex NOT DETECTED NOT DETECTED Final   Escherichia coli NOT DETECTED NOT DETECTED Final   Klebsiella oxytoca NOT DETECTED NOT DETECTED Final   Klebsiella pneumoniae NOT DETECTED NOT DETECTED Final   Proteus species NOT DETECTED NOT DETECTED Final   Serratia marcescens NOT DETECTED NOT DETECTED Final   Haemophilus influenzae NOT DETECTED NOT DETECTED Final   Neisseria meningitidis NOT DETECTED NOT DETECTED Final   Pseudomonas aeruginosa NOT DETECTED NOT DETECTED Final   Candida albicans NOT DETECTED NOT DETECTED Final   Candida glabrata NOT DETECTED NOT DETECTED Final   Candida krusei NOT DETECTED NOT DETECTED Final   Candida parapsilosis NOT DETECTED NOT DETECTED Final   Candida tropicalis NOT DETECTED NOT DETECTED Final    Comment: Performed at [facility], [place] [address], [place],  [address]  MRSA PCR Screening     Status: None   Collection Time: 01/13/17 12:09 AM  Result Value Ref Range Status   MRSA by PCR NEGATIVE NEGATIVE Final    Comment:        The GeneXpert MRSA Assay (FDA approved for NASAL specimens only), is one component of a comprehensive MRSA colonization surveillance program. It is not intended to diagnose MRSA infection nor to guide or monitor treatment for MRSA infections.   Culture, blood (Routine X 2) w Reflex to ID Panel     Status: None  Collection Time: 01/13/17  4:47 AM  Result Value Ref Range Status   Specimen Description BLOOD PICC LINE  Final   Special Requests   Final    BOTTLES DRAWN AEROBIC AND ANAEROBIC Blood Culture results may not be optimal due to an excessive volume of blood received in culture bottles AEROBIC BOTTLE Blood Culture results may not be optimal  due to an inadequate volume of blood received in culture  bottles ANAEROBIC BOTTLE    Culture NO GROWTH 5 DAYS  Final   Report Status 01/18/2017 FINAL  Final    Time coordinating discharge: Over 30 minutes SIGNED:  Orvan Falconer, MD FACP [facility] 01/19/2017, 5:41 PM   If 7PM-7AM, please contact night-coverage [email or web address] Password [email or web address]

## 2017-01-19 NOTE — Progress Notes (Addendum)
Received call back from Dr. Marin Comment, stated aware patient has been refusing lactulose. MD aware patient has been confused today. States okay for discharge, no new orders received regarding potassium. Order received to d/c picc line for discharge. Donavan Foil, RN

## 2017-01-19 NOTE — Discharge Instructions (Signed)
Dialysis Diet Dialysis is a treatment that cleans your blood. It is used when the kidneys are damaged. When you need dialysis, you should watch your diet. This is because some nutrients can build up in your blood between treatments and make you sick. These nutrients are:  Potassium.  Phosphorus.  Sodium.  Your doctor or dietitian will:  Tell you how much of these you can have.  Tell you if you need to look out for other nutrients too.  Help you plan meals.  Tell you how much to drink each day.  What do I need to know about this diet?  Limit potassium. Potassium is in milk, fruits, and vegetables.  Limit phosphorus. Phosphorus is in milk, cheese, beans, nuts, and carbonated beverages.  Limit salt (sodium). Foods that have a lot sodium include processed and cured meats, ready-made frozen meals, canned vegetables, and salty snack foods.  Do not use salt substitutes.  Try not to eat whole-grain foods and foods that have a lot of fiber.  Follow your doctor's instructions about how much to drink. You may be told to: ? Write down what you drink. ? Write down foods you eat that are made mostly from water, such as gelatin and soups. ? Drink from small cups.  Ask your doctor if you should take a medicine that binds phosphorus.  Take vitamin and mineral supplements only as told by your doctor.  Eat meat, poultry, fish, and eggs. Limit nuts and beans.  Before you cook potatoes, cut them into small pieces. Then boil them in unsalted water.  Drain all fluid from cooked vegetables and canned fruits before you eat them. What foods can I eat? Grains White bread. White rice. Cooked cereal. Unsalted popcorn. Tortillas. Pasta. Vegetables Fresh or frozen broccoli, carrots, and green beans. Cabbage. Cauliflower. Celery. Cucumbers. Eggplant. Radishes. Zucchini. Fruits Apples. Fresh or frozen berries. Fresh or canned pears, peaches, and pineapple. Grapes. Plums. Meats and Other Protein  Sources Fresh or frozen beef, pork, chicken, and fish. Eggs. Low-sodium canned tuna or salmon. Dairy Cream cheese. Heavy cream. Ricotta cheese. Beverages Apple cider. Cranberry juice. Grape juice. Lemonade. Black coffee. Condiments Herbs. Spices. Jam and jelly. Honey. Sweets and Desserts Sherbet. Cakes. Cookies. Fats and Oils Olive oil, canola oil, and safflower oil. Other Non-dairy creamer. Non-dairy whipped topping. Homemade broth without salt. The items listed above may not be a complete list of recommended foods or beverages. Contact your dietitian for more options. What foods are not recommended? Grains Whole-grain bread. Whole-grain pasta. High-fiber cereal. Vegetables Potatoes. Beets. Tomatoes. Winter squash and pumpkin. Asparagus. Spinach. Parsnips. Fruits Star fruit. Bananas. Oranges. Kiwi. Nectarines. Prunes. Melon. Dried fruit. Avocado. Meats and Other Protein Sources Canned, smoked, and cured meats. Soil scientist. Sardines. Nuts and seeds. Peanut butter. Beans and legumes. Dairy Milk. Buttermilk. Yogurt. Cheese and cottage cheese. Processed cheese spreads. Beverages Orange juice. Prune juice. Carbonated soft drinks. Condiments Salt. Salt substitutes. Soy sauce. Sweets and Desserts Ice cream. Chocolate. Candied nuts. Fats and Oils Butter. Margarine. Other Ready-made frozen meals. Canned soups. The items listed above may not be a complete list of foods and beverages to avoid. Contact your dietitian for more information. This information is not intended to replace advice given to you by your health care provider. Make sure you discuss any questions you have with your health care provider. Document Released: 01/26/2012 Document Revised: 01/02/2016 Document Reviewed: 02/27/2014 Elsevier Interactive Patient Education  2018 [employer].   Hemodialysis Hemodialysis is a way of removing wastes,  salt, and extra water from your blood. It is done when your  kidneys cannot keep the blood clean. During hemodialysis, your blood travels outside of your body to a machine. A filter in the machine cleans the blood. Hemodialysis is usually done 3 times a week. Visits last 3-5 hours. What happens before the procedure? An opening must be made to allow blood to be taken from your body and put back into your body (access). It is made weeks or months before you start hemodialysis. It is usually made in your arm. If you need hemodialysis right away, a thin, flexible tube (catheter) will be placed in your neck, chest, or groin. This type of access is usually temporary. What happens during the procedure? Hemodialysis is done while you are sitting or leaning back. During hemodialysis you may do any activity as long as you stay sitting or leaning back. Many people sleep, watch television, or read. If you have side effects or feel uncomfortable, tell your doctor. Your doctor may make changes to help you feel more comfortable. Your hemodialysis visits may look like this: 1. You will be weighed and your temperature will be taken. Your blood pressure and pulse will be checked. 2. The skin around your access will be cleaned. 3. Two needles will be put into the access. They will be connected to a plastic tube. The needles will be taped to your skin so that they do not move. If you have a temporary access, your catheter will be connected to a plastic tube. 4. Your blood will go through the tube to the machine. The machine will clean your blood. Then your blood will go back to your body. Your blood pressure and pulse will be checked a few times. 5. Once the procedure is complete, the needles will be removed. A bandage (dressing) will be placed over the access. If your access is a catheter, it will be disconnected.  What happens after the procedure?  You will be weighed.  Your blood will be tested. This is usually done once a month.  You may have side effects. These  include: ? Dizziness. ? Muscle cramps. ? Feeling sick to your stomach (nausea). ? Headaches. ? Feeling tired. You usually feel normal the next day. ? Itchiness. Your doctor may give medicine to help with this. ? Achy or jittery legs. You may feel like kicking your legs. This can cause sleeping problems. ? Allergic reaction. This information is not intended to replace advice given to you by your health care provider. Make sure you discuss any questions you have with your health care provider. Document Released: 07/09/2008 Document Revised: 01/02/2016 Document Reviewed: 09/12/2012 Elsevier Interactive Patient Education  2017 [place] Removal A peripherally inserted central catheter (PICC) is a long, thin, flexible tube that a health care provider can insert into a vein in your upper arm. It is a type of IV. Having a PICC in place gives health care providers quick access to your veins. It is a good way to distribute medicines and fluids quickly throughout your body. Tell a health care provider about:  Any allergies you have.  All medicines you are taking, including vitamins, herbs, eye drops, creams, and over-the-counter medicines.  Any problems you or family members have had with anesthetic medicines.  Any blood disorders you have.  Any surgeries you have had.  Any medical conditions you have. What are the risks? Generally, this is a safe procedure. However, as with any procedure, problems can  occur. Possible problems include:  Bleeding.  Infection.  What happens before the procedure? You need an order from your health care provider to have your PICC removed. Only a health care provider trained in PICC removal should take it out.You may have your PICC removed in the hospital or in an outpatient setting. What happens during the procedure? Having a PICC removed is usually painless. Removal of the tape that holds the PICC in place may be the most uncomfortable part. Do  not take out the PICC yourself. Only a trained clinical professional, such as a PICC nurse, should remove it. If your health care provider thinks your PICC is infected, the tip may be sent to the lab for testing. After taking out your PICC, your health care provider may:  Hold gentle pressure on the exit site.  Apply some antibiotic ointment.  Place a small bandage over the insertion site.  What happens after the procedure? You should be able to remove the bandage after 24 hours. Follow all your health care provider's instructions.  Keep the insertion site clean by washing it gently with soap and water.  Do not pick or remove a scab.  Avoid strenuous physical activity for a day or two.  Let your health care provider know if you develop redness, soreness, bleeding, swelling, or drainage from the insertion site.  Let your health care provider know if you develop chills or fever.  This information is not intended to replace advice given to you by your health care provider. Make sure you discuss any questions you have with your health care provider. Document Released: 01/14/2010 Document Revised: 01/02/2016 Document Reviewed: 05/19/2013 Elsevier Interactive Patient Education  2017 [employer].

## 2017-01-20 ENCOUNTER — Encounter (HOSPITAL_COMMUNITY): Payer: Self-pay | Admitting: Gastroenterology

## 2017-01-20 LAB — BPAM RBC
BLOOD PRODUCT EXPIRATION DATE: 201806122359
Blood Product Expiration Date: 201806122359
ISSUE DATE / TIME: 201806071236
ISSUE DATE / TIME: 201806071456
Unit Type and Rh: 6200
Unit Type and Rh: 6200

## 2017-01-20 LAB — TYPE AND SCREEN
ABO/RH(D): A POS
Antibody Screen: NEGATIVE
Unit division: 0
Unit division: 0

## 2017-01-24 IMAGING — US US PARACENTESIS
1 series · 2 of 2 positions shown · non-contrast
Comparison: none

INDICATION: Symptomatic ascites

[Series 1: us paracentesis · 0.22mm/px · 2 of 2 slices shown]
[im 1/2]
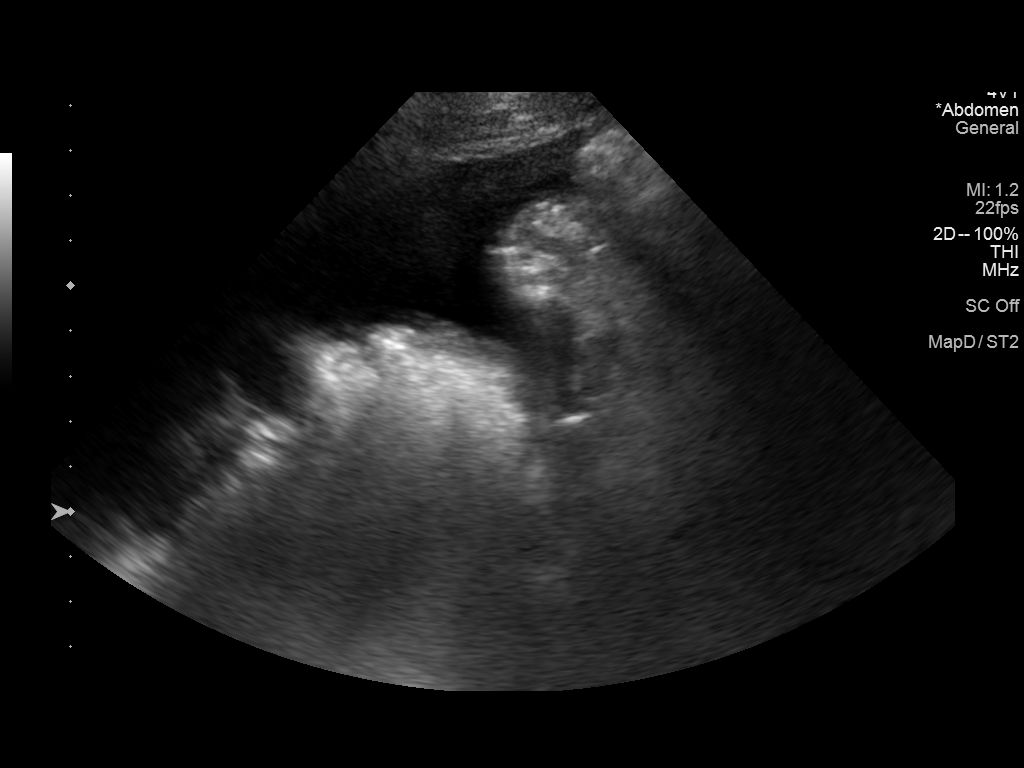
[im 2/2]
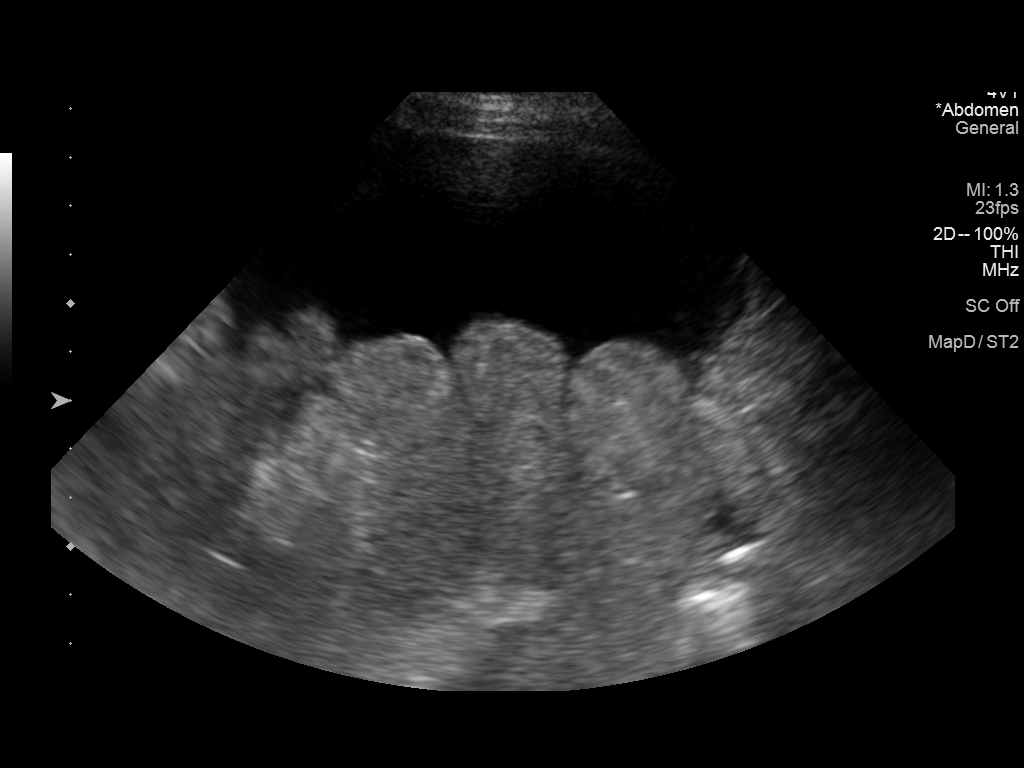

[2 of 2 positions shown; findings below may reference images not displayed]

EXAM:
ULTRASOUND GUIDED THERAPEUTIC PARACENTESIS

MEDICATIONS:
None.

COMPLICATIONS:
None immediate.

PROCEDURE:
Informed written consent was obtained from the patient after a
discussion of the risks, benefits and alternatives to treatment. A
timeout was performed prior to the initiation of the procedure.

Initial ultrasound scanning demonstrates a large amount of ascites
within the left abdomen. The left abdomen was prepped and draped in
the usual sterile fashion. 1% lidocaine was used for local
anesthesia.

Following this, a 19 gauge, 7-cm, Yueh catheter was introduced. An
ultrasound image was saved for documentation purposes. The
paracentesis was performed. The catheter was removed and a dressing
was applied. The patient tolerated the procedure well without
immediate post procedural complication.
FINDINGS: A total of approximately 2.75 L of yellow ascitic fluid was removed.
IMPRESSION: Successful ultrasound-guided paracentesis yielding 2.7 liters of
peritoneal fluid.

## 2017-01-25 ENCOUNTER — Telehealth: Payer: Self-pay

## 2017-01-25 NOTE — Telephone Encounter (Signed)
Received forms in reference to PT for pt. Wanda [name] signed and I faxed back to them in Dr. Artis Flock absence. Copy to be scanned in chart.

## 2017-01-26 ENCOUNTER — Telehealth: Payer: Self-pay

## 2017-01-26 NOTE — Telephone Encounter (Signed)
Pt called and said Home Health said she needed to get [place] to give orders for sterile water and other supplies for her dressings. I told her to have [employer] fax over request for what is needed and she said that she would.

## 2017-01-31 IMAGING — US US PARACENTESIS
1 series · 3 of 3 positions shown · non-contrast
Comparison: none

INDICATION: Cirrhosis, ascites

[Series 1: us paracentesis · 0.22mm/px · 3 of 3 slices shown]
[im 1/3]
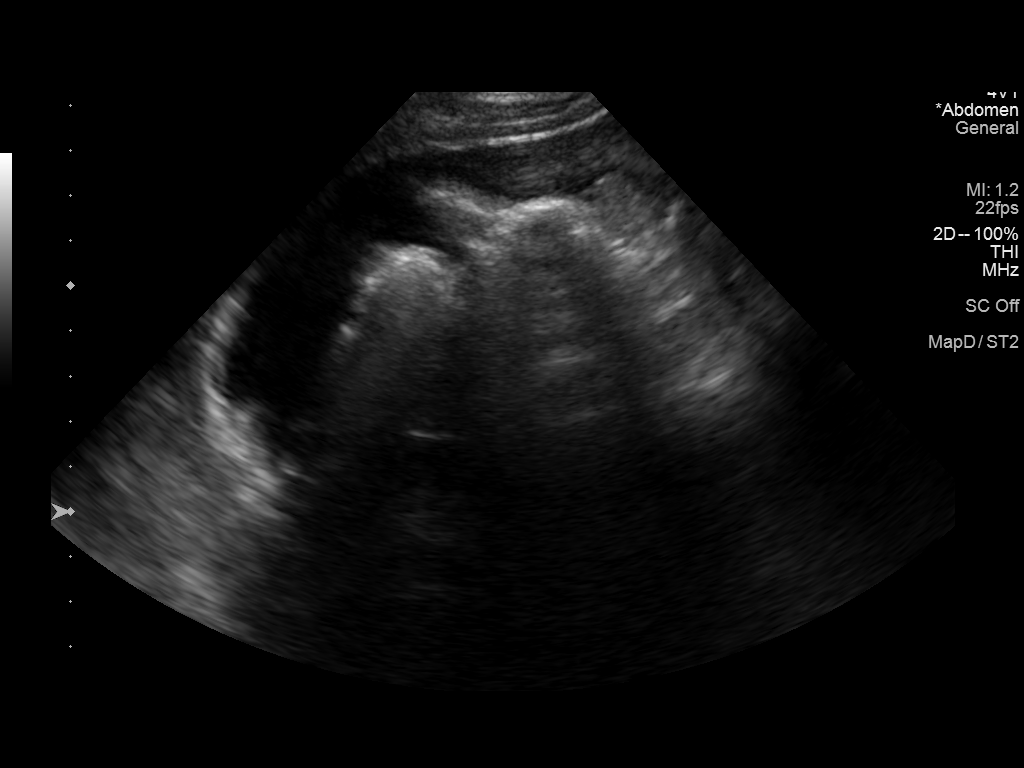
[im 2/3]
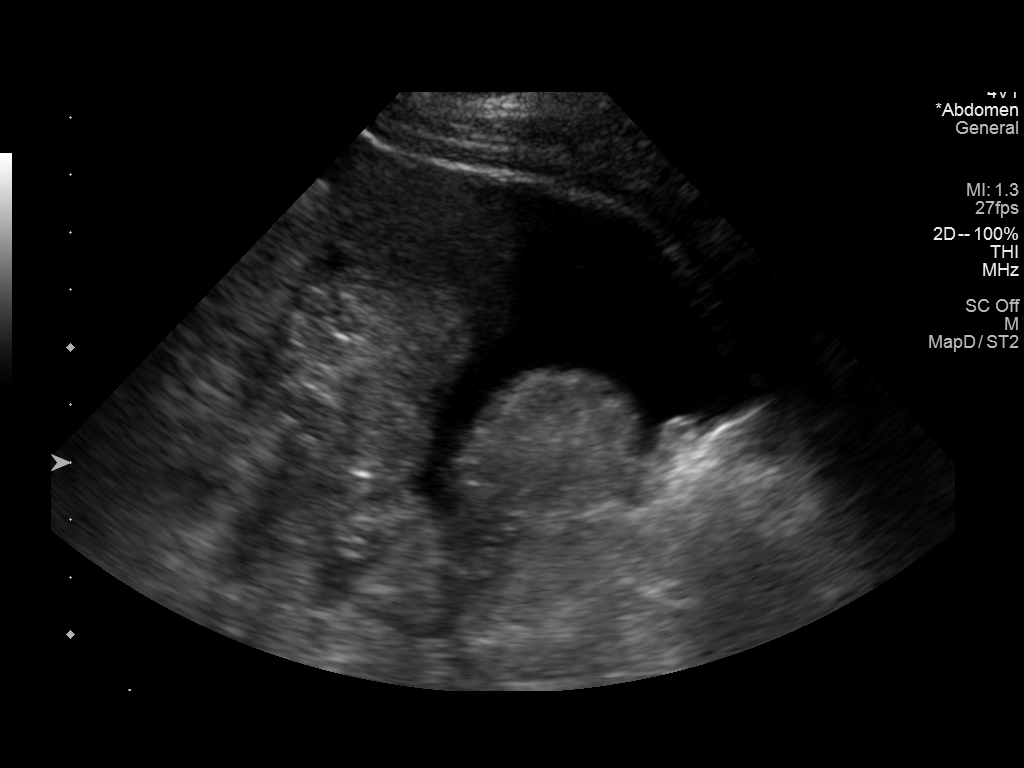
[im 3/3]
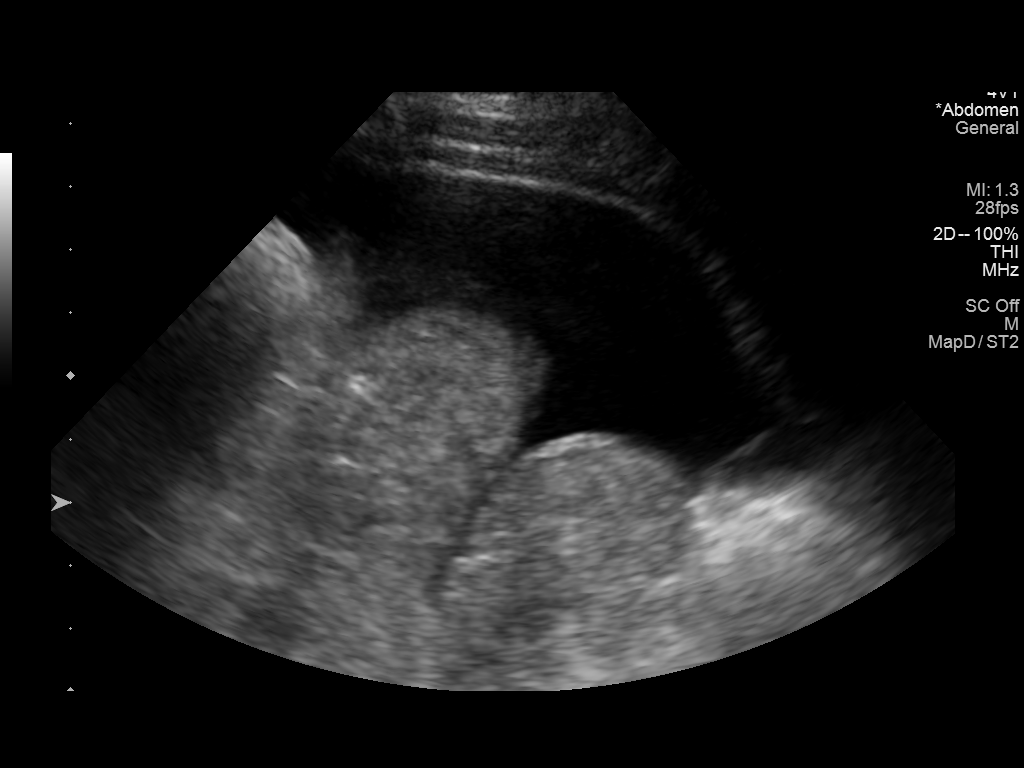

[3 of 3 positions shown; findings below may reference images not displayed]

EXAM:
ULTRASOUND GUIDED THERAPEUTIC PARACENTESIS

MEDICATIONS:
None.

COMPLICATIONS:
None immediate.

PROCEDURE:
Procedure, benefits, and risks of procedure were discussed with
patient.

Written informed consent for procedure was obtained.

Time out protocol followed.

Adequate collection of ascites localized by ultrasound in LEFT lower
quadrant.

Skin prepped and draped in usual sterile fashion.

Skin and soft tissues anesthetized with 10 mL of 1% lidocaine.

5 French Yueh catheter placed into peritoneal cavity.

1.8 L of yellow ascitic fluid aspirated by vacuum bottle suction.

Procedure tolerated well by patient without immediate complication.
FINDINGS: As above
IMPRESSION: Successful ultrasound-guided paracentesis yielding 1.8 L of
peritoneal fluid.

## 2017-02-03 ENCOUNTER — Encounter: Payer: Self-pay | Admitting: Gastroenterology

## 2017-02-04 ENCOUNTER — Telehealth: Payer: Self-pay

## 2017-02-04 NOTE — Telephone Encounter (Signed)
REVIEWED. AGREE. NO ADDITIONAL RECOMMENDATIONS. 

## 2017-02-04 NOTE — Telephone Encounter (Signed)
[facility] Orders for treatment plan:   Signed by Roseanne Kaufman, NP and faxed back.

## 2017-02-04 NOTE — Telephone Encounter (Signed)
Pt called office to schedule OV. Appt with EG scheduled for 03/31/17. Unable to use urgent spot as she has some questions and no urgent problem. She would like to be notified if someone cancels and she can be seen sooner. She requested a letter be mailed to her with appt. Letter was mailed.

## 2017-02-05 NOTE — Telephone Encounter (Signed)
Added her to wait list for possible cancellation

## 2017-02-07 IMAGING — US US PARACENTESIS
1 series · 4 of 4 positions shown · non-contrast
Comparison: none

INDICATION: Cirrhosis due to NASH, ascites

[Series 1: us paracentesis · 0.25mm/px · 4 of 4 slices shown]
[im 1/4]
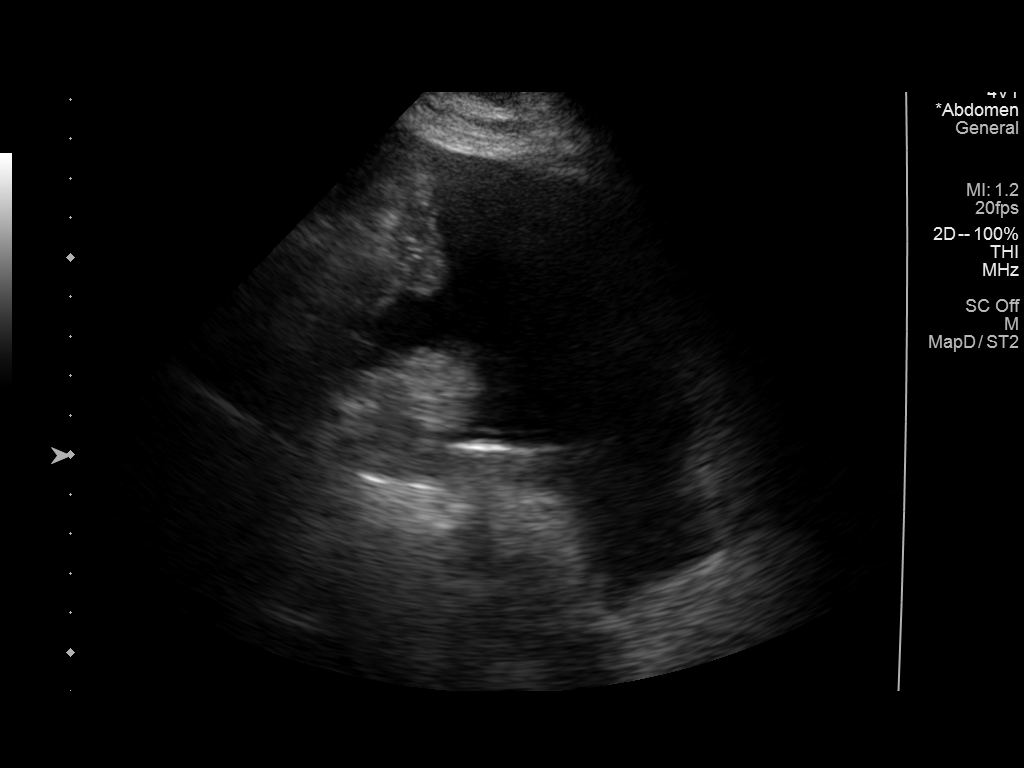
[im 2/4]
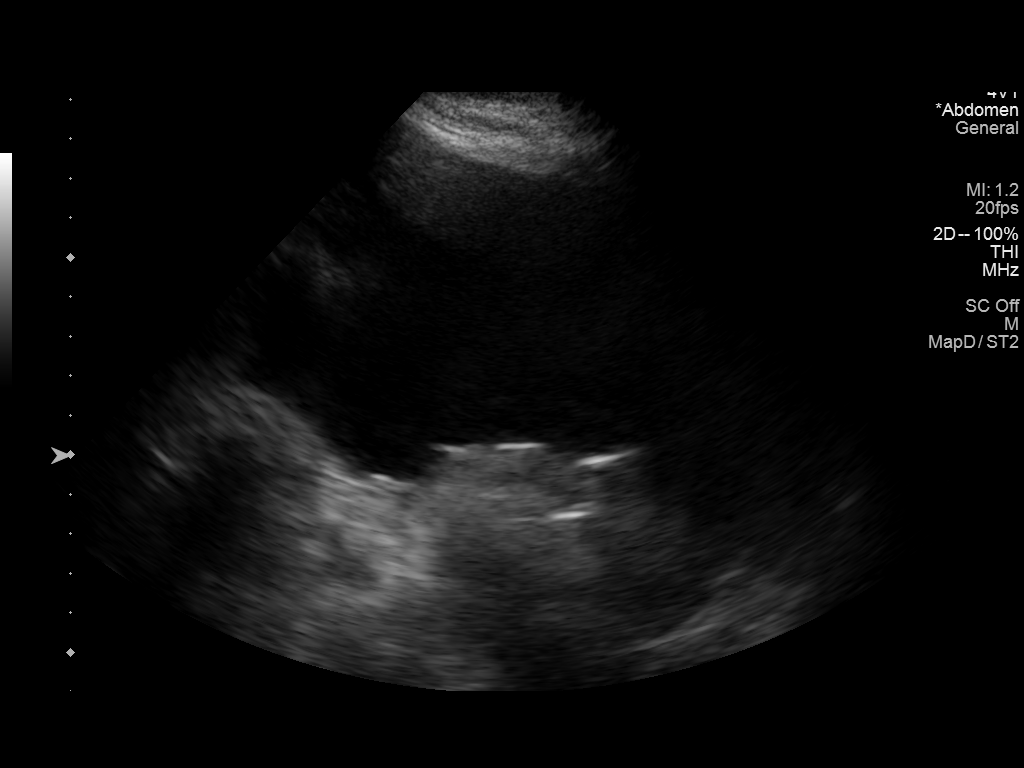
[im 3/4]
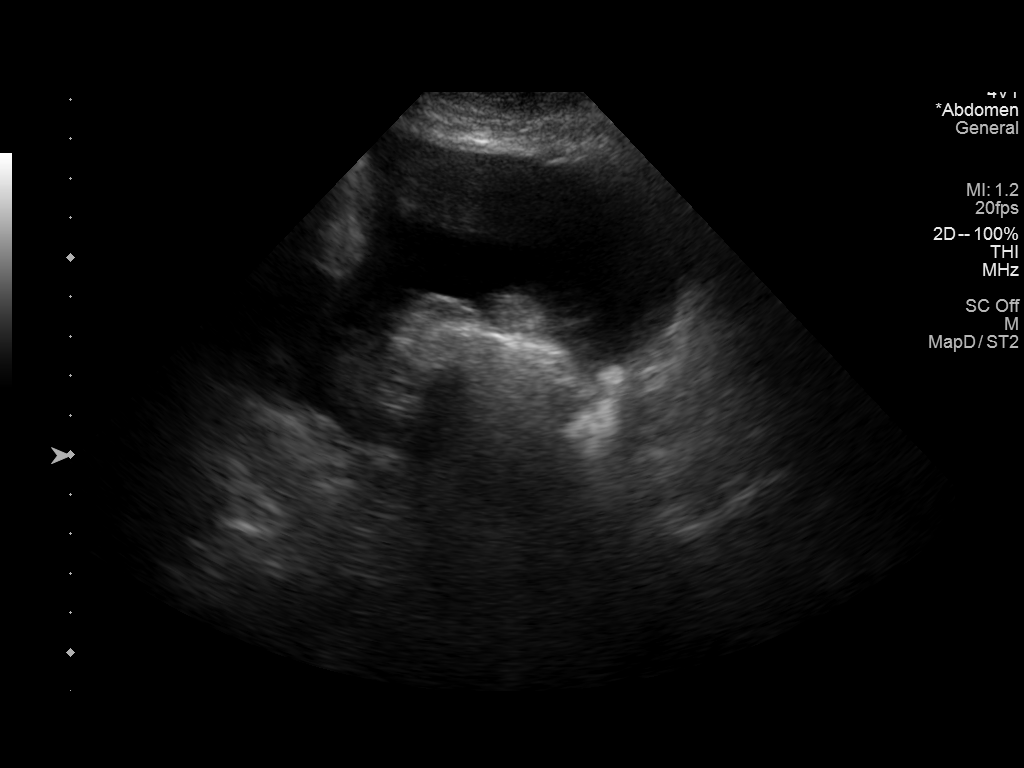
[im 4/4]
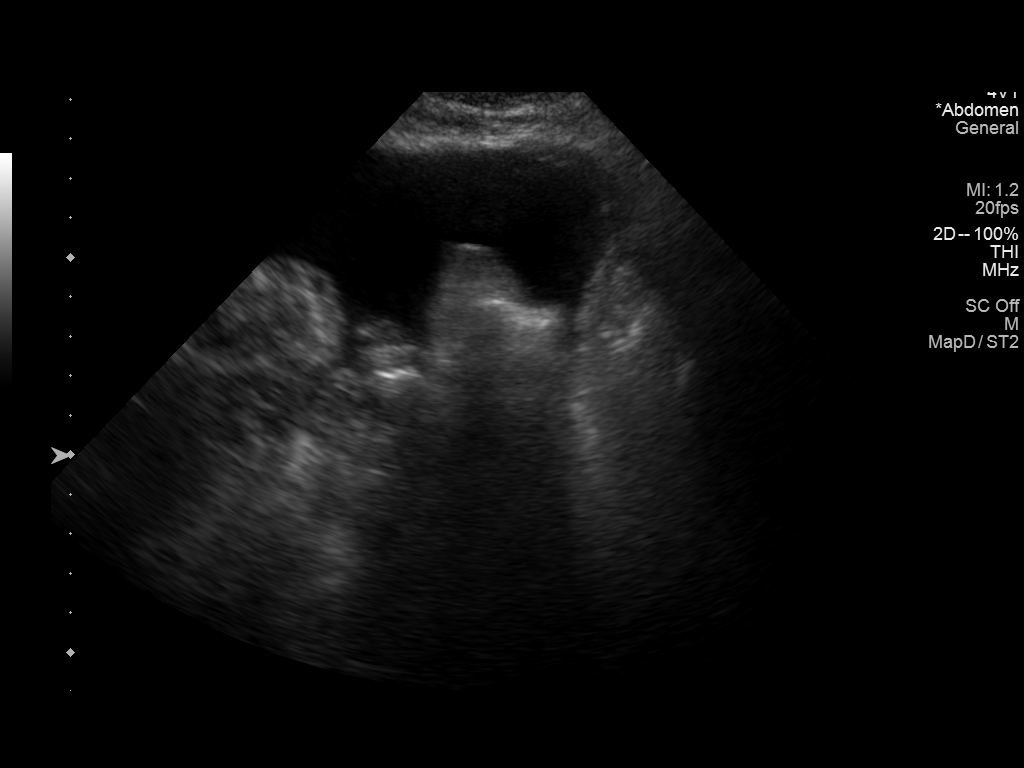

[4 of 4 positions shown; findings below may reference images not displayed]

EXAM:
ULTRASOUND GUIDED THERAPEUTIC PARACENTESIS

MEDICATIONS:
None.

COMPLICATIONS:
None immediate.

PROCEDURE:
Procedure, benefits, and risks of procedure were discussed with
patient.

Written informed consent for procedure was obtained.

Time out protocol followed.

Adequate collection of ascites localized by ultrasound in LEFT lower
quadrant.

Skin prepped and draped in usual sterile fashion.

Skin and soft tissues anesthetized with 10 mL of 1% lidocaine.

5 French Yueh catheter placed into peritoneal cavity.

2.5 L of cloudy yellow fluid aspirated by vacuum bottle suction.

Procedure tolerated well by patient without immediate complication.
FINDINGS: As above
IMPRESSION: Successful ultrasound-guided paracentesis yielding 2.5 liters of
peritoneal fluid.

## 2017-02-08 ENCOUNTER — Telehealth: Payer: Self-pay | Admitting: Emergency Medicine

## 2017-02-08 NOTE — Telephone Encounter (Signed)
LOST TO FOLLOWUP 

## 2017-02-08 NOTE — Telephone Encounter (Signed)
I tried calling the 2 numbers for Wanda [name] and neither one has a VM box set up to leave a message. I tried calling Wanda [name] (boyfriend and emergency contact person) and the line was busy. I am trying to move her OV up from 8/22 with EG to tomorrow with EG at 1030

## 2017-02-14 IMAGING — US US ABDOMEN LIMITED
1 series · 3 of 3 positions shown · non-contrast
Comparison: 07/08/2016 .

CLINICAL DATA: Ascites.

EXAM:
LIMITED ABDOMEN ULTRASOUND FOR ASCITES
TECHNIQUE: Limited ultrasound survey for ascites was performed in all four
abdominal quadrants.

[Series 1: us abdomen limited · 0.21mm/px · 3 of 3 slices shown]
[im 1/3]
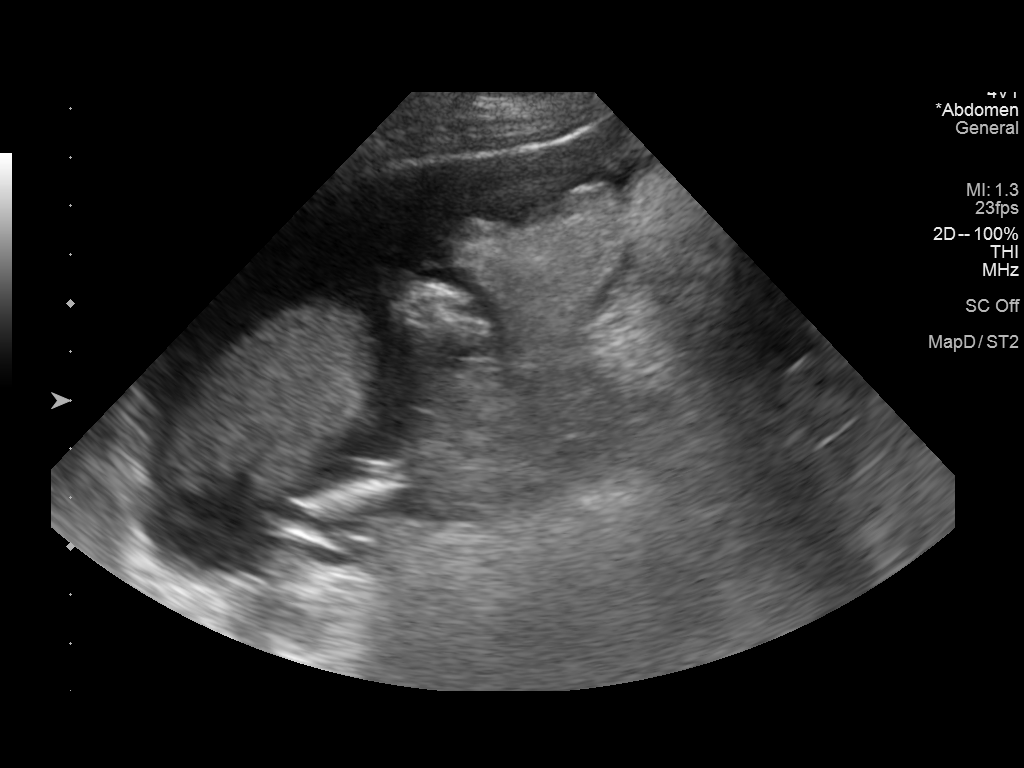
[im 2/3]
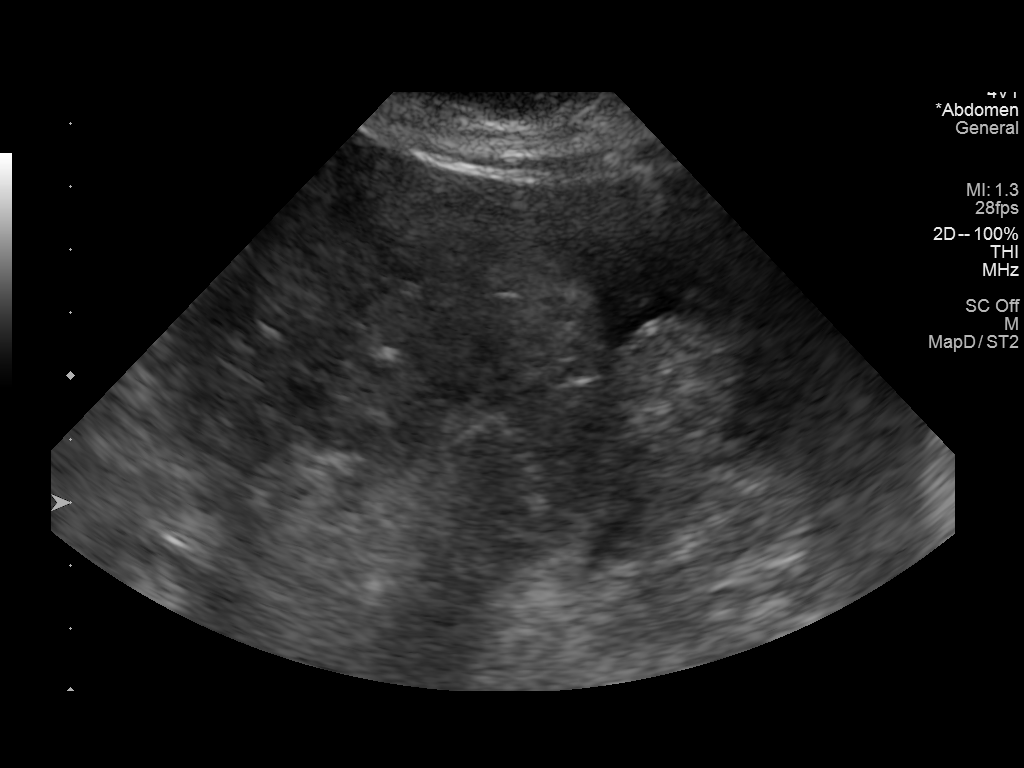
[im 3/3]
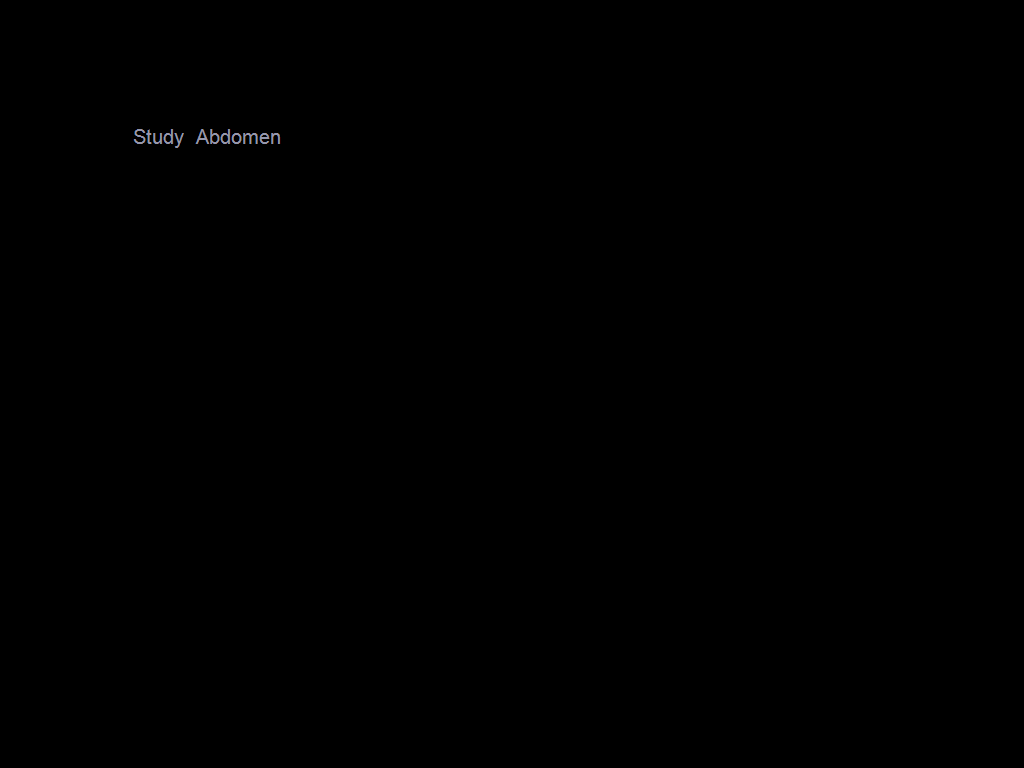

[3 of 3 positions shown; findings below may reference images not displayed]

FINDINGS: Minimal ascites noted.  None ascites noted for safe paracentesis.
IMPRESSION: Minimal ascites.  Us paracentesis not performed at this time .

## 2017-02-22 ENCOUNTER — Telehealth: Payer: Self-pay

## 2017-02-22 NOTE — Telephone Encounter (Signed)
Dr. Oneida Alar to address change in pain medication upon her return tomorrow.   If she has severe pain and cannot wait till tomorrow, then I would advise her to go to ER for evaluation.

## 2017-02-22 NOTE — Telephone Encounter (Signed)
Pt is calling to see if she can have additional pain medication on top or her pain medicine that she has now. She said that she is hurting and needs more pain medicine. Please advise

## 2017-02-23 ENCOUNTER — Other Ambulatory Visit: Payer: Self-pay

## 2017-02-24 NOTE — Telephone Encounter (Signed)
PLEASE CALL PT. WE HAVE AN AGREEMENT FOR 10 MG TID. IF SHE NEEDS MORE PAIN MEDS THEN SHE WILL NEED TO BE SEEN IN THE PAIN CLINIC.

## 2017-02-25 ENCOUNTER — Telehealth: Payer: Self-pay

## 2017-02-25 ENCOUNTER — Other Ambulatory Visit: Payer: Self-pay

## 2017-02-25 DIAGNOSIS — R188 Other ascites: Secondary | ICD-10-CM

## 2017-02-25 NOTE — Telephone Encounter (Signed)
Tried to call with no answer  

## 2017-02-25 NOTE — Telephone Encounter (Signed)
Pt's boyfriend is calling to see if she could have a PARA set up for Wednesday. Please advise

## 2017-02-25 NOTE — Telephone Encounter (Signed)
Pt's boyfriend [name]) is going to try and take her to dialysis in the morning. He said that she has just been to sick and want go.

## 2017-02-25 NOTE — Telephone Encounter (Signed)
PLEASE CALL PT. If she isn't going to dialysis then she will get fluid in her belly. A paracentesis is no substitute for dialysis.

## 2017-02-25 NOTE — Telephone Encounter (Signed)
Pt has not been going to Dialysis because she has been sick.

## 2017-02-25 NOTE — Telephone Encounter (Signed)
PLEASE CALL PT'S BF. ASK IF SHE'S BEEN GOING TO DIALYSIS. SCHEDULE a large volume paracentesis NEXT WED. SHE NEEDS ALBUMIN 12.5 GMS IV AT ONSET AND REPEAT AFTER 4 LS. SHE SHOULD HOLD HER DIURETICS TODAY. SHE NEEDS TO STRICTLY FOLLOW A LOW SALT DIET.

## 2017-02-26 IMAGING — US US PARACENTESIS
1 series · 3 of 3 positions shown · non-contrast
Comparison: none

INDICATION: Cirrhosis, ascites

[Series 1: us paracentesis · 0.21mm/px · 3 of 3 slices shown]
[im 1/3]
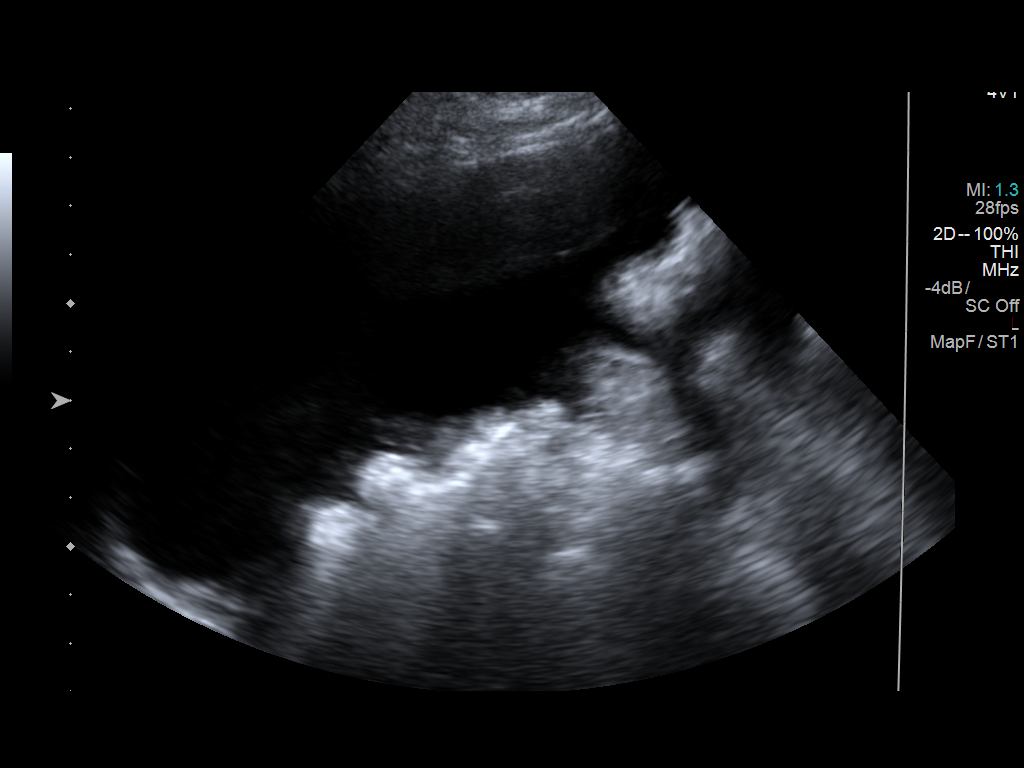
[im 2/3]
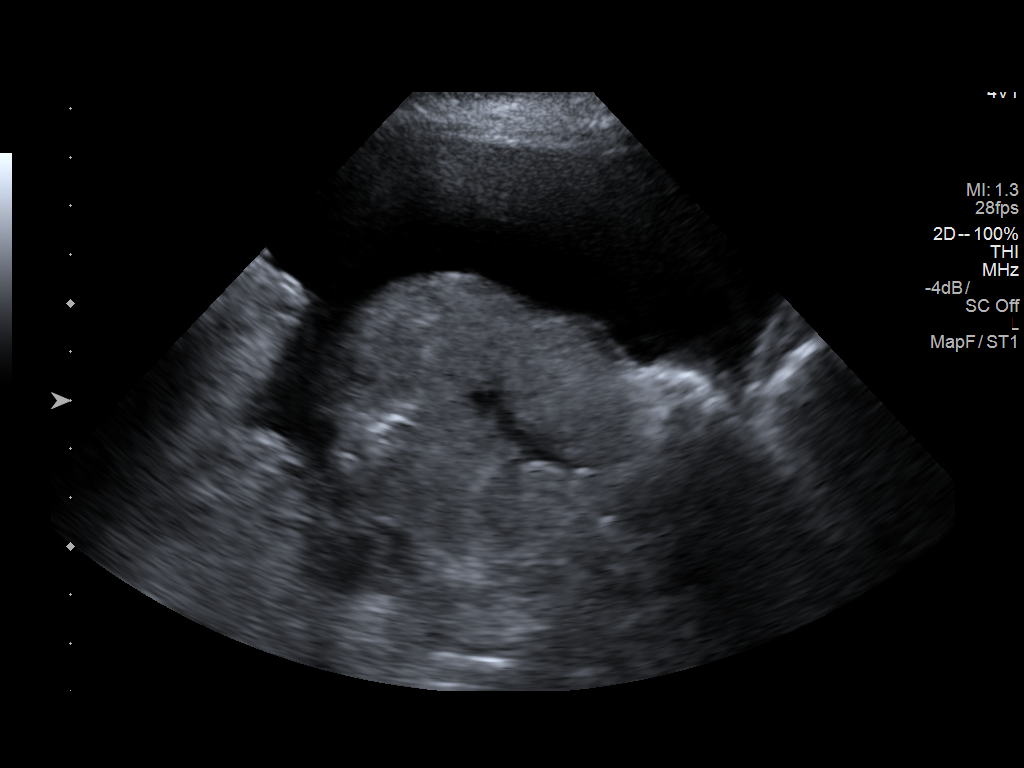
[im 3/3]
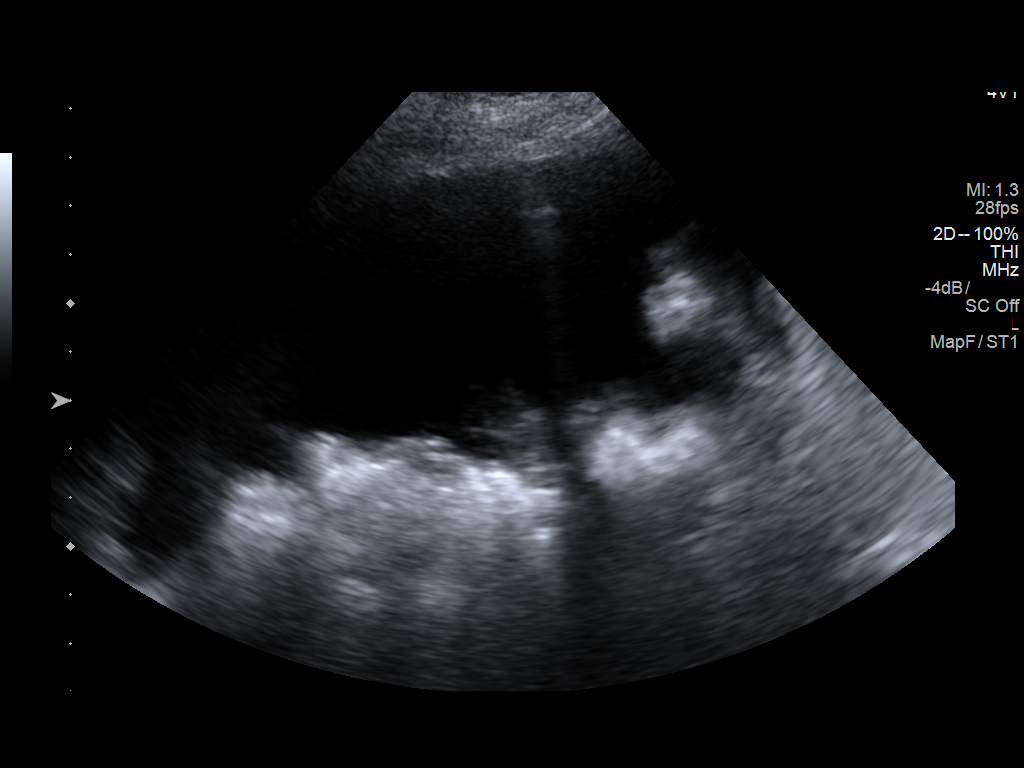

[3 of 3 positions shown; findings below may reference images not displayed]

EXAM:
ULTRASOUND GUIDED THERAPEUTIC PARACENTESIS

MEDICATIONS:
None.

COMPLICATIONS:
None immediate.

PROCEDURE:
Procedure, benefits, and risks of procedure were discussed with
patient.

Written informed consent for procedure was obtained.

Time out protocol followed.

Adequate collection of ascites localized by ultrasound in lateral
LEFT mid abdomen.

Skin prepped and draped in usual sterile fashion.

Skin and soft tissues anesthetized with 10 mL of 1% lidocaine.

5 French Yueh catheter placed into peritoneal cavity.

3.5 L of clear yellow fluid fluid aspirated by vacuum bottle
suction.

Procedure tolerated well by patient without immediate complication.
FINDINGS: As above
IMPRESSION: Successful ultrasound-guided paracentesis yielding 3.5 liters of
peritoneal fluid.

## 2017-02-26 NOTE — Telephone Encounter (Signed)
Pt called and is aware. She said she would like referral to pain clinic. She said that Dr. Nona Dell always told her to let her know when she is on her last bottle of the pain med. She will start the last one on Monday.  She said she thought she was that close om the [facility] also.

## 2017-03-01 ENCOUNTER — Encounter (HOSPITAL_COMMUNITY): Payer: Self-pay | Admitting: *Deleted

## 2017-03-01 DIAGNOSIS — Z992 Dependence on renal dialysis: Secondary | ICD-10-CM | POA: Insufficient documentation

## 2017-03-01 DIAGNOSIS — N186 End stage renal disease: Secondary | ICD-10-CM | POA: Diagnosis not present

## 2017-03-01 DIAGNOSIS — Z79899 Other long term (current) drug therapy: Secondary | ICD-10-CM | POA: Diagnosis not present

## 2017-03-01 DIAGNOSIS — R0789 Other chest pain: Secondary | ICD-10-CM | POA: Insufficient documentation

## 2017-03-01 DIAGNOSIS — R600 Localized edema: Secondary | ICD-10-CM | POA: Insufficient documentation

## 2017-03-01 DIAGNOSIS — R140 Abdominal distension (gaseous): Secondary | ICD-10-CM | POA: Diagnosis not present

## 2017-03-01 DIAGNOSIS — R1012 Left upper quadrant pain: Secondary | ICD-10-CM | POA: Diagnosis not present

## 2017-03-01 DIAGNOSIS — Z87891 Personal history of nicotine dependence: Secondary | ICD-10-CM | POA: Diagnosis not present

## 2017-03-01 DIAGNOSIS — R0602 Shortness of breath: Secondary | ICD-10-CM | POA: Insufficient documentation

## 2017-03-01 DIAGNOSIS — Z91040 Latex allergy status: Secondary | ICD-10-CM | POA: Diagnosis not present

## 2017-03-01 NOTE — ED Triage Notes (Signed)
Pt brought in by [facility] for c/o left upper abdominal pain that started a couple of days ago;

## 2017-03-01 NOTE — Telephone Encounter (Signed)
Tried to call. VM not set up. I am mailing a letter to call.

## 2017-03-01 NOTE — Telephone Encounter (Signed)
PLEASE CALL PT. WE CAN REFER HER TO THE PAIN CLINIC/ WHAT TYPE OF PAIN IS UNCONTROLLED. WE CAN REFILL HER PAIN MEDS FOR 3 MOS AS USUAL.

## 2017-03-02 ENCOUNTER — Other Ambulatory Visit: Payer: Self-pay | Admitting: Gastroenterology

## 2017-03-02 ENCOUNTER — Emergency Department (HOSPITAL_COMMUNITY): Payer: Medicare Other

## 2017-03-02 ENCOUNTER — Emergency Department (HOSPITAL_COMMUNITY)
Admission: EM | Admit: 2017-03-02 | Discharge: 2017-03-02 | Disposition: A | Discharge status: Home / Self Care | Payer: Medicare Other | Attending: Emergency Medicine | Admitting: Emergency Medicine

## 2017-03-02 DIAGNOSIS — R1012 Left upper quadrant pain: Secondary | ICD-10-CM

## 2017-03-02 DIAGNOSIS — R0789 Other chest pain: Secondary | ICD-10-CM

## 2017-03-02 LAB — COMPREHENSIVE METABOLIC PANEL
ALT: 13 U/L — AB (ref 14–54)
AST: 28 U/L (ref 15–41)
Albumin: 3 g/dL — ABNORMAL LOW (ref 3.5–5.0)
Alkaline Phosphatase: 135 U/L — ABNORMAL HIGH (ref 38–126)
Anion gap: 16 — ABNORMAL HIGH (ref 5–15)
BILIRUBIN TOTAL: 1.1 mg/dL (ref 0.3–1.2)
BUN: 41 mg/dL — AB (ref 6–20)
CO2: 25 mmol/L (ref 22–32)
CREATININE: 9.12 mg/dL — AB (ref 0.44–1.00)
Calcium: 8.7 mg/dL — ABNORMAL LOW (ref 8.9–10.3)
Chloride: 99 mmol/L — ABNORMAL LOW (ref 101–111)
GFR, EST AFRICAN AMERICAN: 5 mL/min — AB (ref >60)
GFR, EST NON AFRICAN AMERICAN: 5 mL/min — AB (ref >60)
Glucose, Bld: 107 mg/dL — ABNORMAL HIGH (ref 65–99)
Potassium: 4.5 mmol/L (ref 3.5–5.1)
Sodium: 140 mmol/L (ref 135–145)
TOTAL PROTEIN: 7 g/dL (ref 6.5–8.1)

## 2017-03-02 LAB — CBC WITH DIFFERENTIAL/PLATELET
Basophils Absolute: 0.1 10*3/uL (ref 0.0–0.1)
Basophils Relative: 1 %
EOS ABS: 0.2 10*3/uL (ref 0.0–0.7)
EOS PCT: 5 %
HCT: 31.1 % — ABNORMAL LOW (ref 36.0–46.0)
HEMOGLOBIN: 9.9 g/dL — AB (ref 12.0–15.0)
LYMPHS ABS: 0.9 10*3/uL (ref 0.7–4.0)
Lymphocytes Relative: 20 %
MCH: 31 pg (ref 26.0–34.0)
MCHC: 31.8 g/dL (ref 30.0–36.0)
MCV: 97.5 fL (ref 78.0–100.0)
MONO ABS: 0.4 10*3/uL (ref 0.1–1.0)
MONOS PCT: 8 %
Neutro Abs: 2.8 10*3/uL (ref 1.7–7.7)
Neutrophils Relative %: 66 %
PLATELETS: 145 10*3/uL — AB (ref 150–400)
RBC: 3.19 MIL/uL — ABNORMAL LOW (ref 3.87–5.11)
RDW: 16.9 % — AB (ref 11.5–15.5)
WBC: 4.3 10*3/uL (ref 4.0–10.5)

## 2017-03-02 LAB — PROTIME-INR
INR: 0.94
PROTHROMBIN TIME: 12.6 s (ref 11.4–15.2)

## 2017-03-02 LAB — TROPONIN I: Troponin I: <0.03 ng/mL (ref <0.03)

## 2017-03-02 LAB — LIPASE, BLOOD: LIPASE: 95 U/L — AB (ref 11–51)

## 2017-03-02 LAB — ETHANOL: ALCOHOL ETHYL (B): 92 mg/dL — AB (ref <5)

## 2017-03-02 MED ORDER — FENTANYL CITRATE (PF) 100 MCG/2ML IJ SOLN
100.0000 ug | Freq: Once | INTRAMUSCULAR | Status: AC
  Administered 2017-03-02: 100 ug via INTRAVENOUS
  Filled 2017-03-02: qty 2

## 2017-03-02 MED ORDER — FENTANYL CITRATE (PF) 100 MCG/2ML IJ SOLN: 50.0000 ug | Freq: Once | INTRAMUSCULAR | Status: DC

## 2017-03-02 NOTE — ED Notes (Signed)
Pt states chronic abdominal pain. Pain tonight is on the left upper side. Rates a 10/10. C/o off & on nausea.

## 2017-03-02 NOTE — ED Provider Notes (Signed)
[facility] DEPT Provider Note   CSN: [id number] Arrival date & time: 03/01/17  2236     History   Chief Complaint Chief Complaint  Patient presents with  . Abdominal Pain    HPI Wanda [name] is a 41 y.o. female.  HPI  41 year old female with a history of end-stage renal disease and end-stage liver disease presents with left-sided chest and abdominal pain. She states this is been going on for a couple weeks but the history is difficult because the patient is a poor historian. She states that the pain has significantly worsened. It is primarily in her left lower chest over her ribs but also in her left abdomen. Her ascites has progressively grown. Last paracentesis was about 6 weeks ago per her. Vomited once one week ago but no vomiting since. She tells me that she short of breath because she is in pain. No fevers.  Past Medical History:  Diagnosis Date  . Acute blood loss anemia 02/25/2014   Status post transfusion  . Acute renal failure ([place]) 09/2013   Pre-renal- resolved  . Anasarca 10/10/2013  . Anxiety   . Bipolar disorder ([place]) 12/04/2013   2007-SEEN IN ED FOR INVOLUNTARY COMMITMENT, UDS POS FOR AMPHETAMINES/OPIATES   . Bleeding esophageal varices ([place]) 02/28/2014   s/p banding  . C. difficile colitis 04/19/2014  . Chronic hypotension   . Cirrhosis ([place]) 10/05/13   Liver bx 11/23/13 (delayed initially due to patient refusal). c/w steatohepatitis  . Cirrhosis of liver with ascites ([place])   . Depression   . ESRD (end stage renal disease) on dialysis ([place]) 08/2014  . Folate deficiency 09/2013  . Gastroesophageal junction ulcer 09/17/2014  . GERD (gastroesophageal reflux disease)   . Hematemesis/vomiting blood 02/24/2014  . Macrocytosis 02/28/2014  . PNA (pneumonia) 10/13/2013  . SBP (spontaneous bacterial peritonitis) ([place]) 11/10/2013  . Thrombocytopenia ([place])    Hypercellular bone marrow; abundant megakaryocytes per 08/27/2014; s/p bone marrow bx    Patient Active Problem List     Diagnosis Date Noted  . Encounter for orogastric (OG) tube placement   . Hepatic encephalopathy ([place])   . Uremia   . Altered mental status 01/12/2017  . Cellulitis 01/12/2017  . Bullous skin disease 12/22/2016  . ESRD (end stage renal disease) ([place]) 12/13/2016  . Volume overload 12/13/2016  . History of noncompliance with medical treatment   . Liver cirrhosis secondary to NASH (nonalcoholic steatohepatitis) ([place])   . Palliative care encounter   . Pulmonary edema 11/20/2016  . Hypocalcemia 11/20/2016  . Cardiomyopathy- ? ischemic  10/09/2016  . Troponin level elevated 10/09/2016  . Goals of care, counseling/discussion   . Palliative care by specialist   . Seizure ([place]) 10/07/2016  . Pressure injury of skin 05/20/2016  . Shock ([place]) 05/19/2016  . Rhonchi   . Chronic pain syndrome 08/22/2015  . Abdominal pain 08/07/2015  . Umbilical hernia without obstruction and without gangrene 08/07/2015  . GI bleed   . Acute blood loss anemia   . Hypotension 06/05/2015  . Edema of left lower extremity 02/22/2015  . Ascites   . History of esophageal varices with bleeding 09/16/2014  . End stage renal disease on dialysis ([place])   . Anemia of chronic disease   . Thrombocytopenia ([place]) 08/19/2014  . Hyponatremia 08/17/2014  . Esophageal varices without bleeding ([place])   . Portal hypertensive gastropathy ([place])   . Malnutrition of moderate degree ([place]) 05/15/2014  . Bipolar disorder ([place]) 12/04/2013  . SBP (spontaneous bacterial peritonitis) ([place])  11/10/2013  . Liver cirrhosis secondary to nonalcoholic steatohepatitis (NASH) ([place]) 11/07/2013  . Folate deficiency 10/13/2013  . Anasarca 10/10/2013  . Anemia 10/06/2013    Past Surgical History:  Procedure Laterality Date  . AV FISTULA PLACEMENT Right 11/16/2014   Procedure: Right arm Creation of arteriovenous fistula;  Surgeon: Angelia Mould, MD;  Location: [facility];  Service: Vascular;  Laterality: Right;  . CENTRAL VENOUS CATHETER  INSERTION Right   . COLONOSCOPY N/A 12/19/2013   SLF:NO OBVIOUS SOURCE FOR ANEMIA IDETIFIED/ONE COLON POLYP REMOVED/Small internal hemorrhoids  . ESOPHAGEAL BANDING  07/04/2014   Procedure: ESOPHAGEAL BANDING;  Surgeon: Daneil Dolin, MD;  [facility];  Service: Endoscopy;;  . ESOPHAGEAL BANDING N/A 07/24/2014   Procedure: ESOPHAGEAL BANDING (2 bands applied);  Surgeon: Danie Binder, MD;  Location: [facility];  Service: Endoscopy;  Laterality: N/A;  . ESOPHAGEAL BANDING N/A 07/23/2015   Procedure: ESOPHAGEAL BANDING;  Surgeon: Danie Binder, MD;  [facility];  Service: Endoscopy;  Laterality: N/A;  . ESOPHAGEAL BANDING N/A 03/04/2016   Procedure: ESOPHAGEAL BANDING;  Surgeon: Danie Binder, MD;  [facility];  Service: Endoscopy;  Laterality: N/A;  . ESOPHAGEAL BANDING N/A 04/30/2016   Procedure: ESOPHAGEAL BANDING;  Surgeon: Daneil Dolin, MD;  [facility];  Service: Endoscopy;  Laterality: N/A;  . ESOPHAGEAL BANDING N/A 01/13/2017   Procedure: ESOPHAGEAL BANDING;  Surgeon: Danie Binder, MD;  [facility];  Service: Endoscopy;  Laterality: N/A;  . ESOPHAGOGASTRODUODENOSCOPY N/A 11/14/2013   SLF:1 column of very small varices in distal esopahgus/MODERATE PORTAL GASTROPATHY IN PROXIMAL STOMACH/MODERATE erosive gastritis  . ESOPHAGOGASTRODUODENOSCOPY N/A 02/11/2014   Dr. Rourk:Esophageal varices with bleeding stigmata-status post esophageal band ligation therapy. Portal gastropathy  . ESOPHAGOGASTRODUODENOSCOPY N/A 07/04/2014   RMR: Persiting grade 2 esophageal varicies with bleeding stigmata status post band ligation. Significantly congested gastric mucosa iwith changes constistant with protal gastropathy.   . ESOPHAGOGASTRODUODENOSCOPY N/A 09/16/2014   Rehman: Single short column of varix proximal to GE junction not large enough to be banded. Two amall ulcers at the GEJ felt to be source of GI Bleeding but no active bleeding but no actibe  bleeding noted. No therapy rendered. Portal gastropathy NO evidence of peptic ulcer diease or gastric varices.   . ESOPHAGOGASTRODUODENOSCOPY N/A 12/14/2014   SLF: Grade ! esophageal varices. 2. Moderate Portal Gastropathy  . ESOPHAGOGASTRODUODENOSCOPY N/A 03/27/2015   Procedure: ESOPHAGOGASTRODUODENOSCOPY (EGD);  Surgeon: Danie Binder, MD;  [facility];  Service: Endoscopy;  Laterality: N/A;  1045am - moved to 817 @ 11:30  . ESOPHAGOGASTRODUODENOSCOPY N/A 06/05/2015   Procedure: ESOPHAGOGASTRODUODENOSCOPY (EGD);  Surgeon: Clarene Essex, MD;  Location[facility] ENDOSCOPY;  Service: Endoscopy;  Laterality: N/A;  . ESOPHAGOGASTRODUODENOSCOPY N/A 07/23/2015   SLF:1. Grade 1 esophageal varices 2. Moderate portal hypertensive gastropathy 3. MILd non-erosive gastritis.   [name] ESOPHAGOGASTRODUODENOSCOPY N/A 03/04/2016   Dr. Oneida Alar: grade 1 varices, surveillance in Jan 2017   . ESOPHAGOGASTRODUODENOSCOPY N/A 05/21/2016   Dr. Carlean Purl: non-bleeding esophageal ulcer, likely secondary to MW tear  . ESOPHAGOGASTRODUODENOSCOPY N/A 01/13/2017   Procedure: ESOPHAGOGASTRODUODENOSCOPY (EGD);  Surgeon: Danie Binder, MD;  [facility];  Service: Endoscopy;  Laterality: N/A;  . ESOPHAGOGASTRODUODENOSCOPY (EGD) WITH PROPOFOL N/A 07/24/2014   SLF:  1. 2 columns grade 2-3 varices- 2 Bands applied.  2.  Moderate gastropathy 3. Duodenal Diverticula  . ESOPHAGOGASTRODUODENOSCOPY (EGD) WITH PROPOFOL N/A 10/26/2014   RMR: 2 columns of grade 2  esophageal varices without obvious bleeding stigmata status post band ligation to complet obliteration of remaining varices.   . ESOPHAGOGASTRODUODENOSCOPY (EGD) WITH PROPOFOL N/A 04/30/2016   Dr. Gala Romney: MW tear, varices obliterated, portal gastropathy  . None    . PARACENTESIS  Feb 2015   1180 fluid, negative fluid analysis.   [name] PARACENTESIS  10/2013    OB History    Gravida Para Term Preterm AB Living   1 1 1     1    SAB TAB Ectopic Multiple Live Births                     Home Medications    Prior to Admission medications   Medication Sig Start Date End Date Taking? Authorizing Provider  amoxicillin-clavulanate (AUGMENTIN) 500-125 MG tablet Take 1 tablet (500 mg total) by mouth daily. 01/19/17   Orvan Falconer, MD  augmented betamethasone dipropionate (DIPROLENE-AF) 0.05 % cream APPLY TO THE AFFECTED AREAS ON LEGS TWICE DAILY AS NEEDED (NOT FACE, GROIN, OR UNDER ARMS) 12/24/16   [provider]  chlorhexidine gluconate, MEDLINE KIT, (PERIDEX) 0.12 % solution 15 mLs by Mouth Rinse route 2 (two) times daily. 01/19/17   Orvan Falconer, MD  clotrimazole (LOTRIMIN) 1 % cream Apply 1 application topically 3 (three) times daily. 12/25/16   Annitta Needs, NP  Darbepoetin Alfa (ARANESP) 60 MCG/0.3ML SOSY injection Inject 0.3 mLs (60 mcg total) into the vein every Thursday with hemodialysis. Patient taking differently: Inject 60 mcg into the vein every Tuesday with hemodialysis.  06/09/15   Cherene Altes, MD  lactulose (CHRONULAC) 10 GM/15ML solution Take 15-30 mLs (10-20 g total) by mouth 3 (three) times daily. TAKE 15-30 ml BY MOUTH three times a day 12/21/16   Samuella Cota, MD  lidocaine-prilocaine (EMLA) cream Apply 1 application topically as needed (for dialysis treatments).    [provider]  LORazepam (ATIVAN) 1 MG tablet Take 1 mg by mouth 2 (two) times daily as needed for anxiety (and/or back spasms).    [provider]  midodrine (PROAMATINE) 5 MG tablet TAKE TWO (2) TABLETS BY MOUTH DAILY. TAKE ONLY ON DIALYSIS DAYS ON TUESDAYS, THURDAYS, AND SATURDAYS (SOMETIME TAKES TREATMENTS ON FRIDAY IN 12/29/16   [provider]  omeprazole (PRILOSEC) 20 MG capsule 1 po every morning 30 minutes prior to your first meal. Patient taking differently: Take 20 mg by mouth daily before breakfast. 1 po every morning 30 minutes prior to your first meal. 09/30/16   Fields, Marga Melnick, MD  Oxycodone HCl 10 MG TABS Take 0.5 tablets (5 mg total) by  mouth 3 (three) times daily as needed. 1 PO TID AS NEEDED FOR PAIN Patient taking differently: Take 5-10 mg by mouth 3 (three) times daily as needed.  09/27/16   Cherene Altes, MD  pantoprazole (PROTONIX) 40 MG tablet Take 1 tablet (40 mg total) by mouth daily at 6 (six) AM. 01/20/17   Orvan Falconer, MD  RENVELA 800 MG tablet Take 1600 mg by mouth 3 times daily with meals. Take 800 mg by mouth with snacks. 10/29/15   [provider]  sucralfate (CARAFATE) 1 GM/10ML suspension Take 1 g by mouth 4 (four) times daily -  with meals and at bedtime.    [provider]    Family History Family History  Problem Relation Age of Onset  . Heart disease Mother   . Colon cancer Neg Hx   . Liver disease Neg Hx  Social History Social History  Substance Use Topics  . Smoking status: Former Smoker    Packs/day: 0.25    Years: 20.00    Types: Cigarettes    Quit date: 11/05/2008  . Smokeless tobacco: Never Used     Comment: Never really smoked much  . Alcohol use No     Allergies   Iohexol; Lasix [furosemide]; and Latex   Review of Systems Review of Systems  Constitutional: Negative for fever.  Respiratory: Positive for shortness of breath.   Cardiovascular: Positive for chest pain and leg swelling (chronic).  Gastrointestinal: Positive for abdominal distention and abdominal pain. Negative for nausea.  All other systems reviewed and are negative.    Physical Exam Updated Vital Signs BP 107/64   Pulse 99   Temp 97.7 F (36.5 C) (Oral)   Resp (!) 24   Ht 5' (1.524 m)   Wt 83.9 kg (185 lb)   LMP 09/21/2013   SpO2 95%   BMI 36.13 kg/m   Physical Exam  Constitutional: She is oriented to person, place, and time. She appears well-developed and well-nourished.  HENT:  Head: Normocephalic and atraumatic.  Right Ear: External ear normal.  Left Ear: External ear normal.  Nose: Nose normal.  Eyes: Right eye exhibits no discharge. Left eye exhibits no discharge.    Cardiovascular: Normal rate, regular rhythm and normal heart sounds.   Pulmonary/Chest: Effort normal and breath sounds normal. She exhibits tenderness.    Abdominal: Soft. She exhibits distension and ascites. There is tenderness in the left upper quadrant and left lower quadrant.  Neurological: She is alert and oriented to person, place, and time.  Skin: Skin is warm and dry. She is not diaphoretic.  Nursing note and vitals reviewed.    ED Treatments / Results  Labs (all labs ordered are listed, but only abnormal results are displayed) Labs Reviewed  COMPREHENSIVE METABOLIC PANEL - Abnormal; Notable for the following:       Result Value   Chloride 99 (*)    Glucose, Bld 107 (*)    BUN 41 (*)    Creatinine, Ser 9.12 (*)    Calcium 8.7 (*)    Albumin 3.0 (*)    ALT 13 (*)    Alkaline Phosphatase 135 (*)    GFR calc non Af Amer 5 (*)    GFR calc Af Amer 5 (*)    Anion gap 16 (*)    All other components within normal limits  ETHANOL - Abnormal; Notable for the following:    Alcohol, Ethyl (B) 92 (*)    All other components within normal limits  LIPASE, BLOOD - Abnormal; Notable for the following:    Lipase 95 (*)    All other components within normal limits  CBC WITH DIFFERENTIAL/PLATELET - Abnormal; Notable for the following:    RBC 3.19 (*)    Hemoglobin 9.9 (*)    HCT 31.1 (*)    RDW 16.9 (*)    Platelets 145 (*)    All other components within normal limits  TROPONIN I  PROTIME-INR  URINALYSIS, ROUTINE W REFLEX MICROSCOPIC    EKG  EKG Interpretation  Date/Time:  Tuesday March 02 2017 02:59:00 EDT Ventricular Rate:  99 PR Interval:    QRS Duration: 73 QT Interval:  350 QTC Calculation: 450 R Axis:   53 Text Interpretation:  Sinus rhythm Consider right atrial enlargement Anterior infarct, old Baseline wander in lead(s) V5 no significant change compared to May 2018 Confirmed by  Sherwood Gambler [id number]) on 03/02/2017 3:07:07 AM       Radiology Dg Chest 2  View  Result Date: 03/02/2017 CLINICAL DATA:  Left-sided chest pain. EXAM: CHEST  2 VIEW COMPARISON:  Radiographs 01/16/2017, additional priors. FINDINGS: Decreased cardiomegaly from prior exam. Again seen atherosclerosis of the aortic arch. Improved vascular congestion from prior exam. No focal airspace disease. No large pleural effusion. No pneumothorax. Bones are under mineralized. IMPRESSION: 1. Decreased cardiomegaly and vascular congestion from prior exam. 2. Aortic atherosclerosis. Electronically Signed   By: Jeb Levering M.D.   On: 03/02/2017 03:47    Procedures Procedures (including critical care time)  Medications Ordered in ED Medications  fentaNYL (SUBLIMAZE) injection 100 mcg (100 mcg Intravenous Given 03/02/17 0304)     Initial Impression / Assessment and Plan / ED Course  I have reviewed the triage vital signs and the nursing notes.  Pertinent labs & imaging results that were available during my care of the patient were reviewed by me and considered in my medical decision making (see chart for details).     Patient's workup does not show any significant pathologically that would be the cause of her 3 week or so chest/abdominal pain. She is afebrile and no vomiting this week. No significant cough. ECG and troponin do not show obvious pathology. I asked her about alcohol use, which she declines although her EtOH is positive in the ED. However she is not clinically intoxicated. Her lipase is mildly elevated, consistent with multiple priors. This could be due to pancreatitis. I doubt PE given length of symptoms with no hypoxia or distress. She is dramatically better after a dose of IV fentanyl. She has significant ascites but given length of time, no fevers, and normal WBC think SBP is unlikely. I did offer to do a diagnostic paracentesis but she declines. She wants to wait for her therapeutic one in a couple days. Since I think SBP is unlikely I think this is reasonable. She is  due for dialysis tomorrow, I strongly encouraged her to continue this. She is chronically on oxycodone at home, is asking me for additional narcotics at home such as fentanyl lollipop. I think this is unnecessary at this time and her primary doctor who manages her pain needs to address this as an outpatient. Discussed return precautions.  Final Clinical Impressions(s) / ED Diagnoses   Final diagnoses:  Left-sided chest wall pain  LUQ abdominal pain    New Prescriptions Discharge Medication List as of 03/02/2017  4:04 AM       Sherwood Gambler, MD 03/02/17 [phone]

## 2017-03-02 NOTE — ED Notes (Signed)
Pt alert & oriented x4, stable gait. Patient  given discharge instructions, paperwork & prescription(s). Patient verbalized understanding. Pt left department w/ no further questions. 

## 2017-03-03 ENCOUNTER — Telehealth: Payer: Self-pay | Admitting: Gastroenterology

## 2017-03-03 ENCOUNTER — Ambulatory Visit (HOSPITAL_COMMUNITY): Admission: RE | Admit: 2017-03-03 | Payer: Medicare Other | Source: Ambulatory Visit

## 2017-03-03 ENCOUNTER — Ambulatory Visit (HOSPITAL_COMMUNITY): Payer: Medicare Other

## 2017-03-03 NOTE — Telephone Encounter (Signed)
PLEASE CALL PT. Ask her what she needs to irrigate.

## 2017-03-03 NOTE — Telephone Encounter (Signed)
Pt called to see if [facility] would write her a prescription for sterile water and sodium chloride for irrigation. She uses [facility] in [place]. Please advise.

## 2017-03-03 NOTE — Telephone Encounter (Signed)
Forwarding to Dr.Fields.  

## 2017-03-03 NOTE — Telephone Encounter (Signed)
PT said that is what [facility] was using to irrigate her legs which are still bandaged.  [facility] has been discontinued, and she did not file to fight the decision. But they told her to keep irrigating them.

## 2017-03-05 IMAGING — US US ABDOMEN COMPLETE
1 series · 13 of 25 positions shown · non-contrast
Comparison: 04/29/2016, CT scan 01/02/2016

CLINICAL DATA: Cirrhosis, dialysis patient. Abdominal pain with
distention

EXAM:
ABDOMEN ULTRASOUND COMPLETE

[Series 1: us abdomen complete · 0.28mm/px · 13 of 76 slices shown]
[im 1/76]
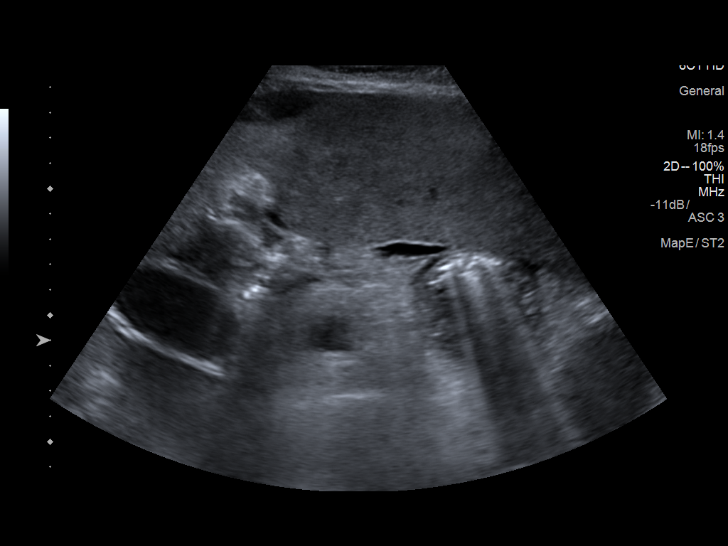
[im 7/76]
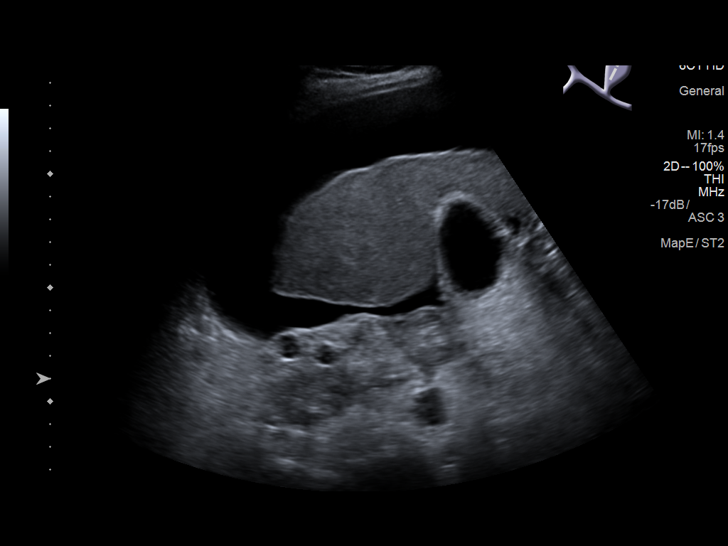
[im 13/76]
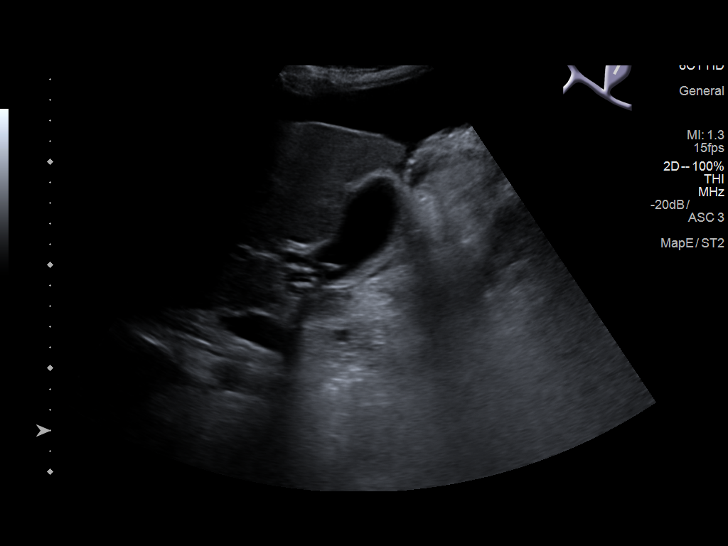
[im 19/76]
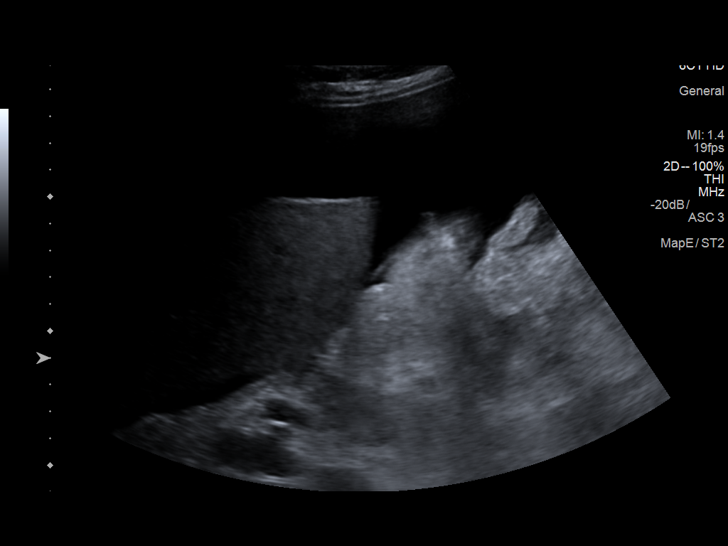
[im 26/76]
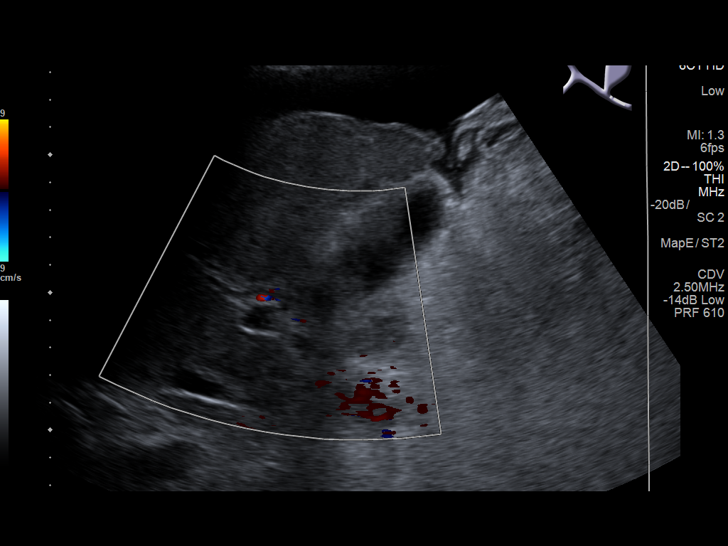
[im 32/76]
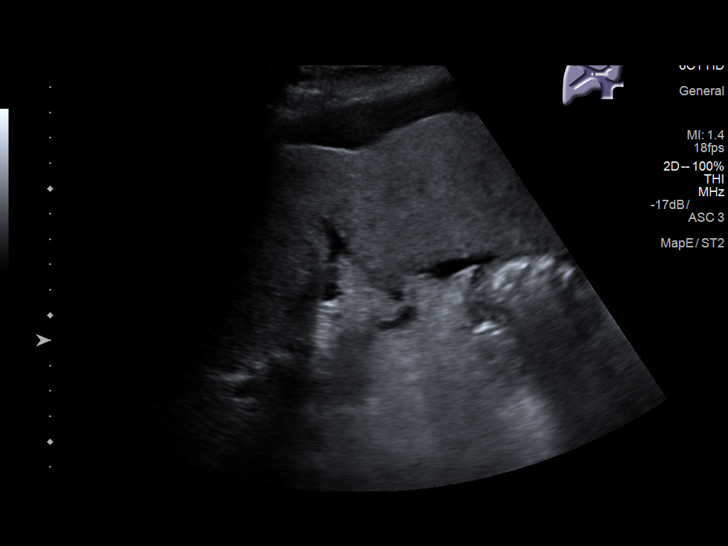
[im 38/76]
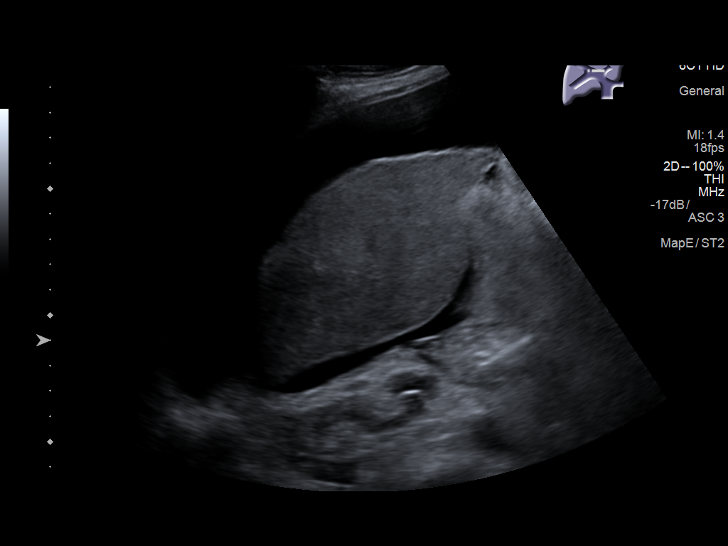
[im 44/76]
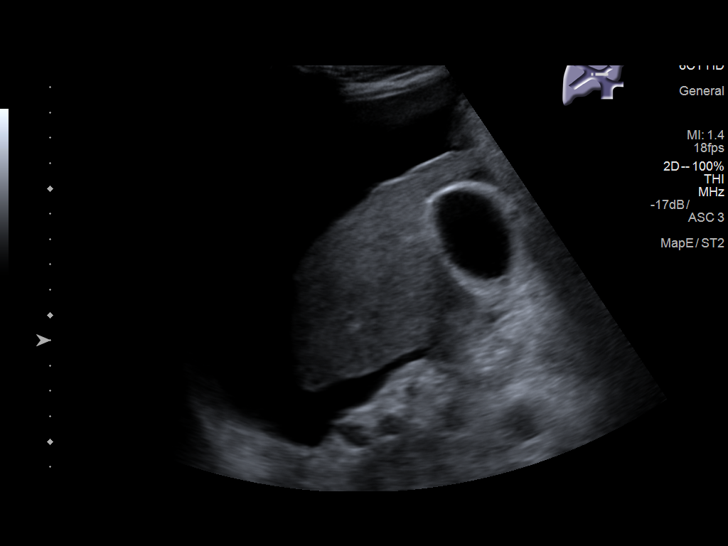
[im 51/76]
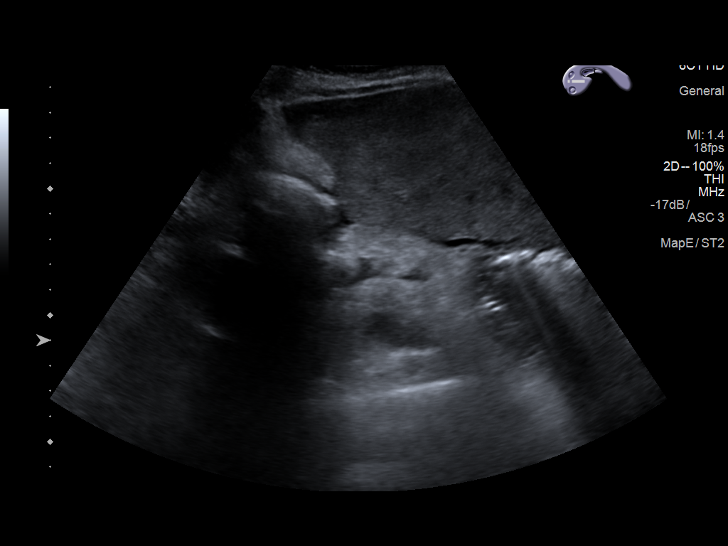
[im 57/76]
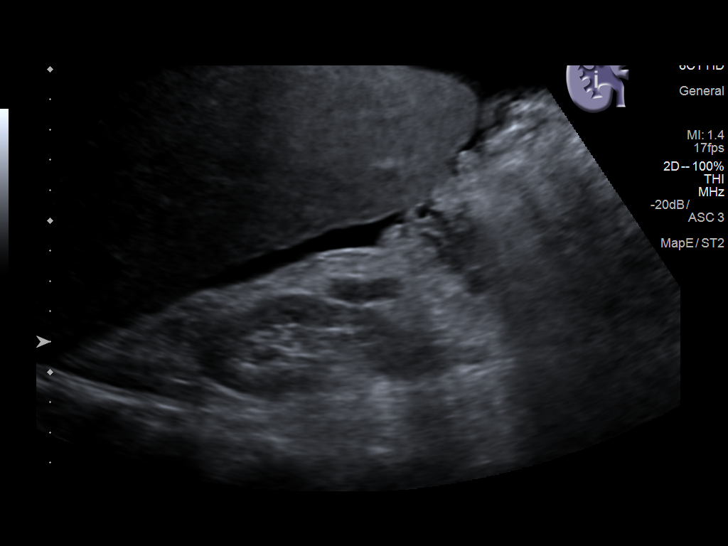
[im 63/76]
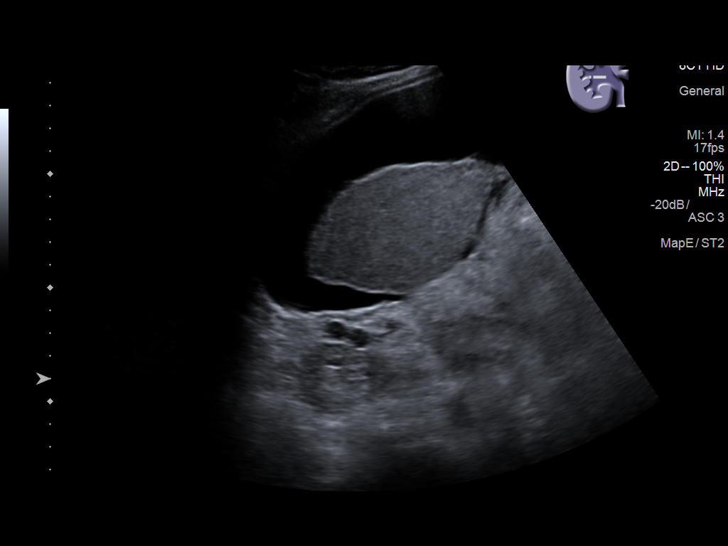
[im 69/76]
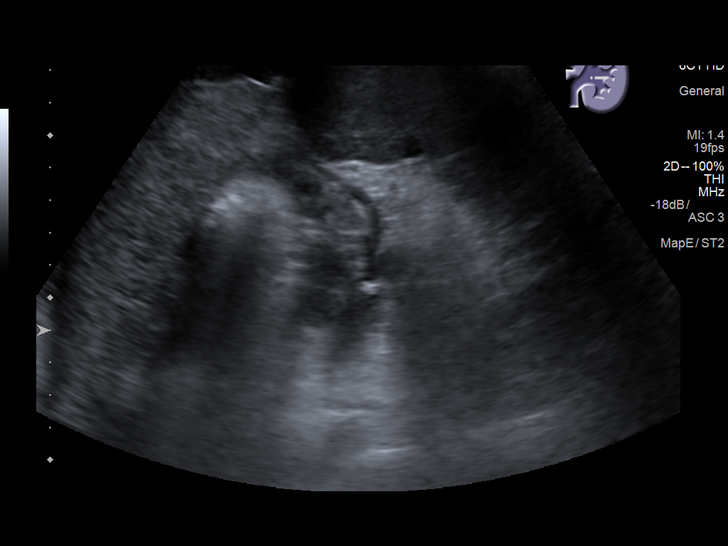
[im 76/76]
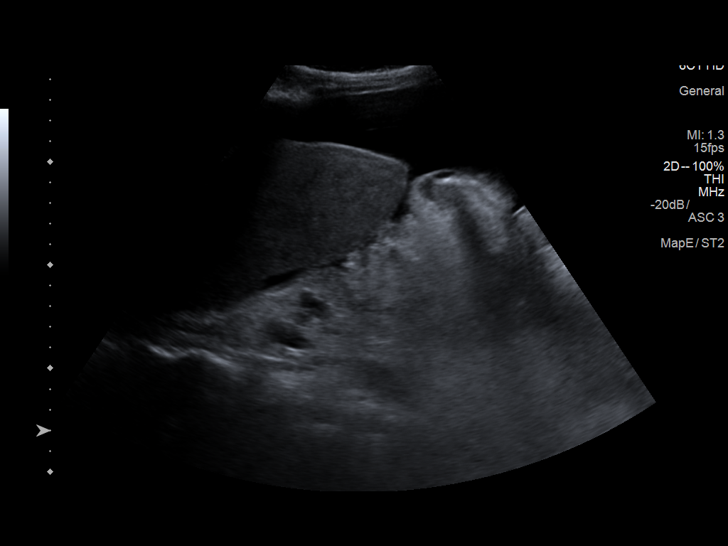

[13 of 25 positions shown; findings below may reference images not displayed]

FINDINGS: Gallbladder: Shadowing stone measuring 8 mm is present within the
gallbladder lumen. No sonographic Murphy's sign per sonographer.
Mild wall thickening measuring 3.1 mm.

Common bile duct: Diameter: 4.1 mm

Liver: Heterogenous echotexture with nodularity consistent with
cirrhosis. No gross focal hepatic abnormality.

IVC: No abnormality visualized.

Pancreas: Visualized portion unremarkable.

Spleen: Size and appearance within normal limits.

Right Kidney: Length: 9.4 cm. Slight increased cortical
echogenicity. No mass or hydronephrosis visualized.

Left Kidney: Length: 7.7 cm. Slight increased cortical echogenicity.
No mass or hydronephrosis visualized.

Abdominal aorta: No aneurysm visualized. Poor visualization of the
distal aorta and bifurcation.

Other findings: Large volume of ascites. Tortuous vessels superior
to the you right kidney and spleen, consistent with varices.
IMPRESSION: 1. Gallstone. Mild nonspecific wall thickening of the gallbladder.
Normal common bile duct diameter.
2. Cirrhotic appearing liver.
3. Large volume of ascites.
4. Slightly small appearing kidneys with mild increased cortical
echogenicity.

## 2017-03-07 NOTE — Telephone Encounter (Signed)
PLEASE CALL PT. I CANNOT MANAGE HER LEG ISSUES. SHE WILL NEED TO SPEAK WITH DR. Izola Price.

## 2017-03-08 NOTE — Telephone Encounter (Signed)
Tried to call. Vm not set up. Will mail a letter to pt with the info.

## 2017-03-09 IMAGING — US US PARACENTESIS
1 series · 7 of 7 positions shown · non-contrast
Comparison: none

INDICATION: Ascites secondary to NASH cirrhosis. Request for therapeutic
paracentesis up to 5 liters.

[Series 1: us paracentesis · 0.28mm/px · 7 of 7 slices shown]
[im 1/7]
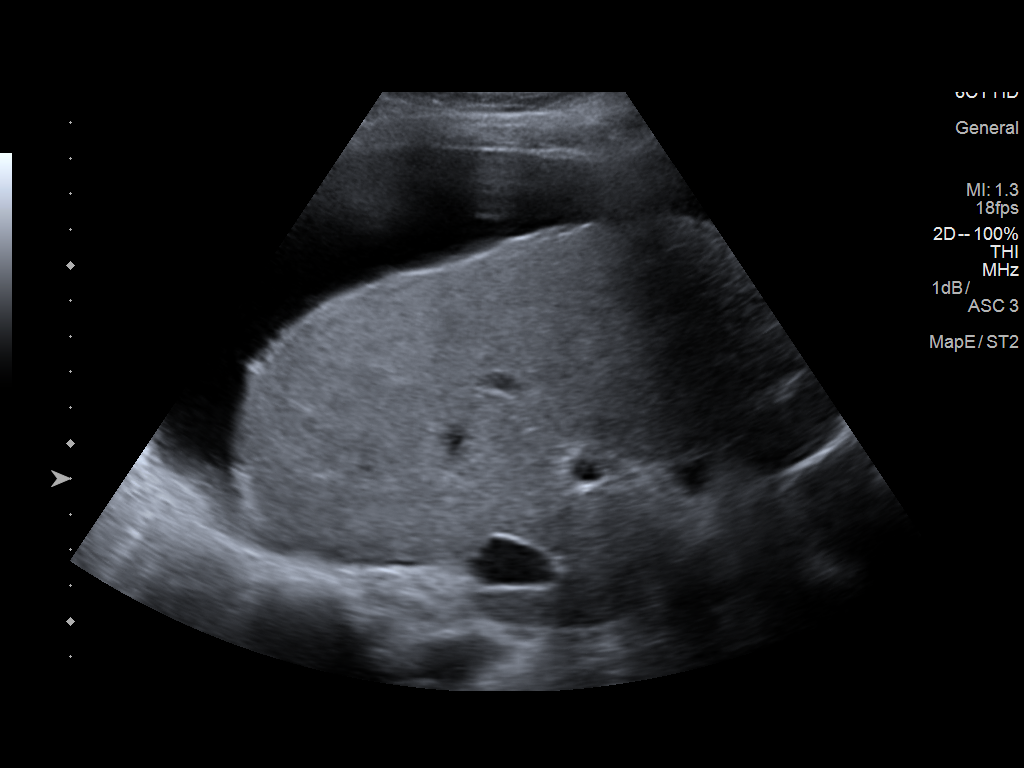
[im 2/7]
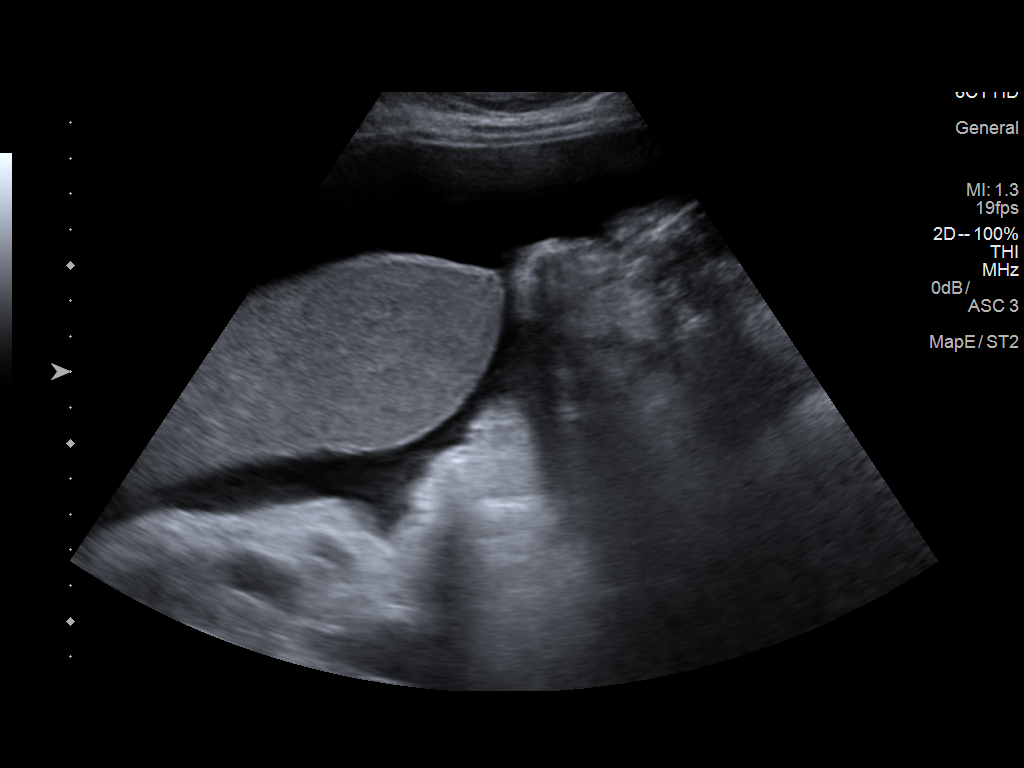
[im 3/7]
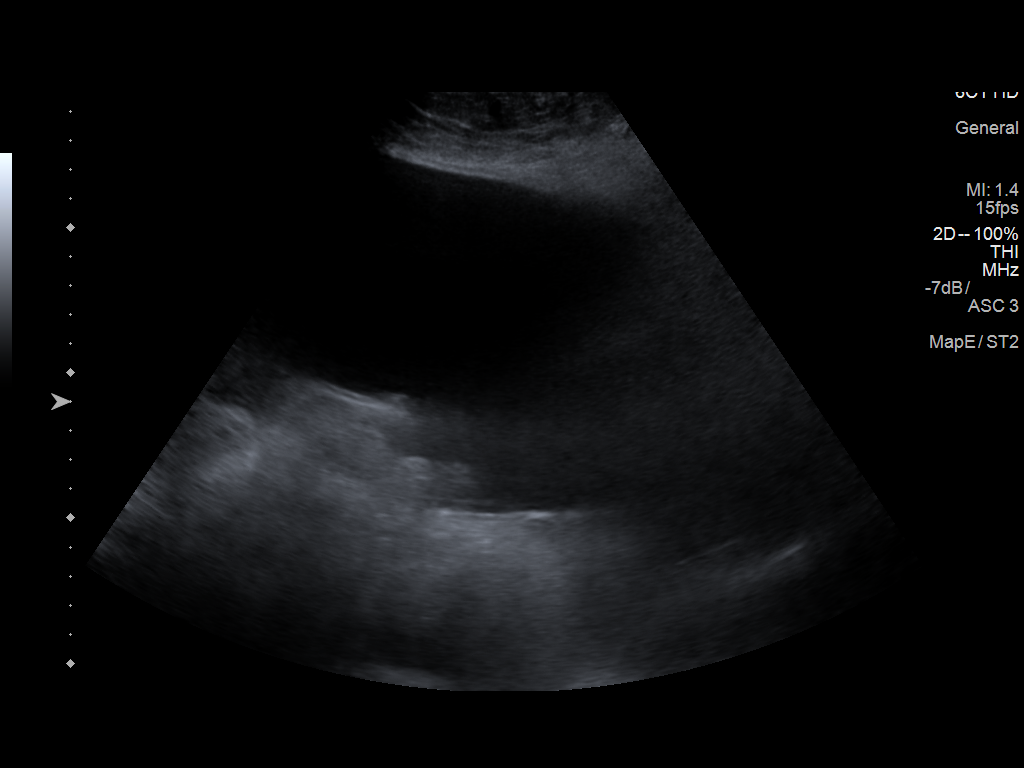
[im 4/7]
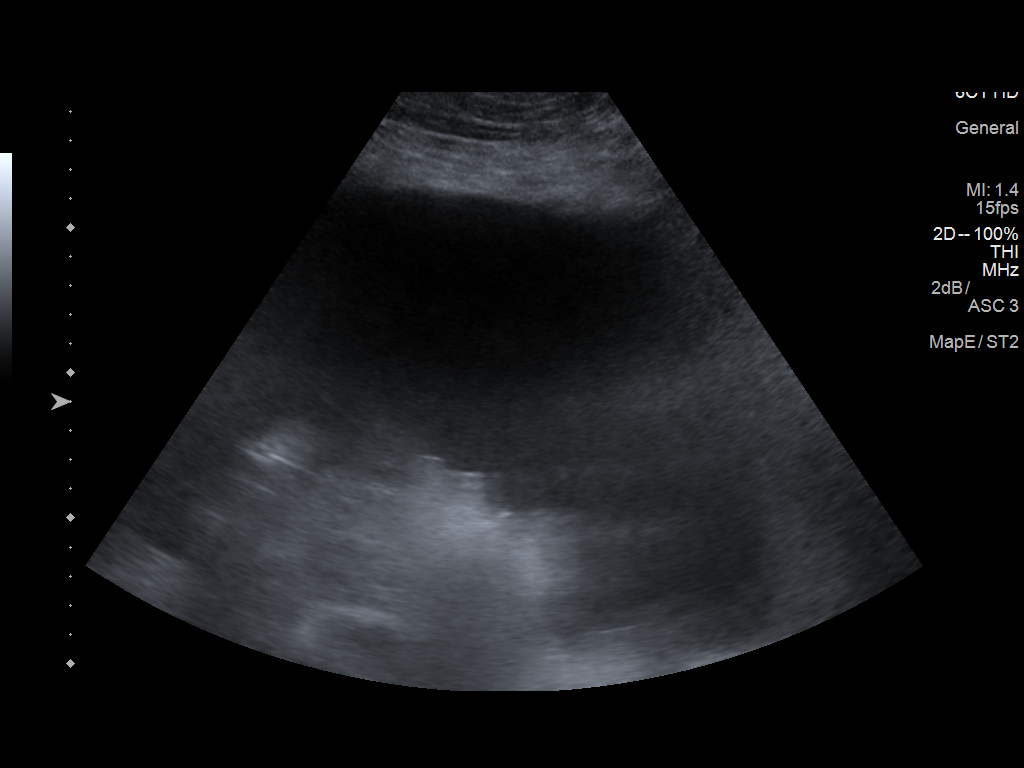
[im 5/7]
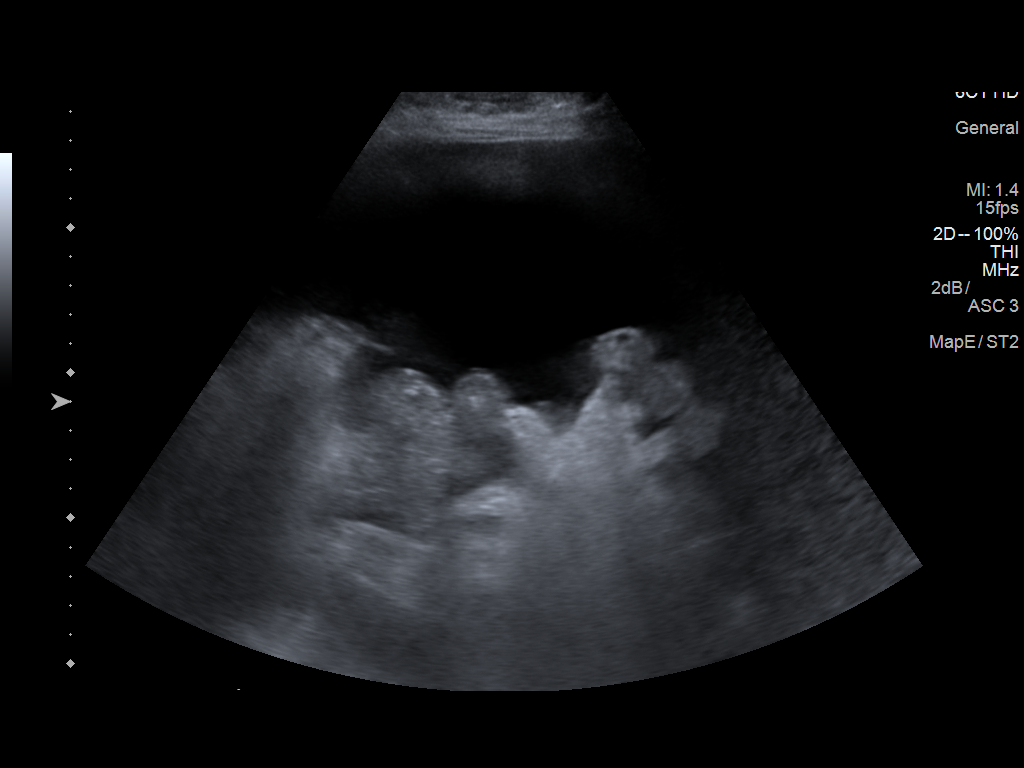
[im 6/7]
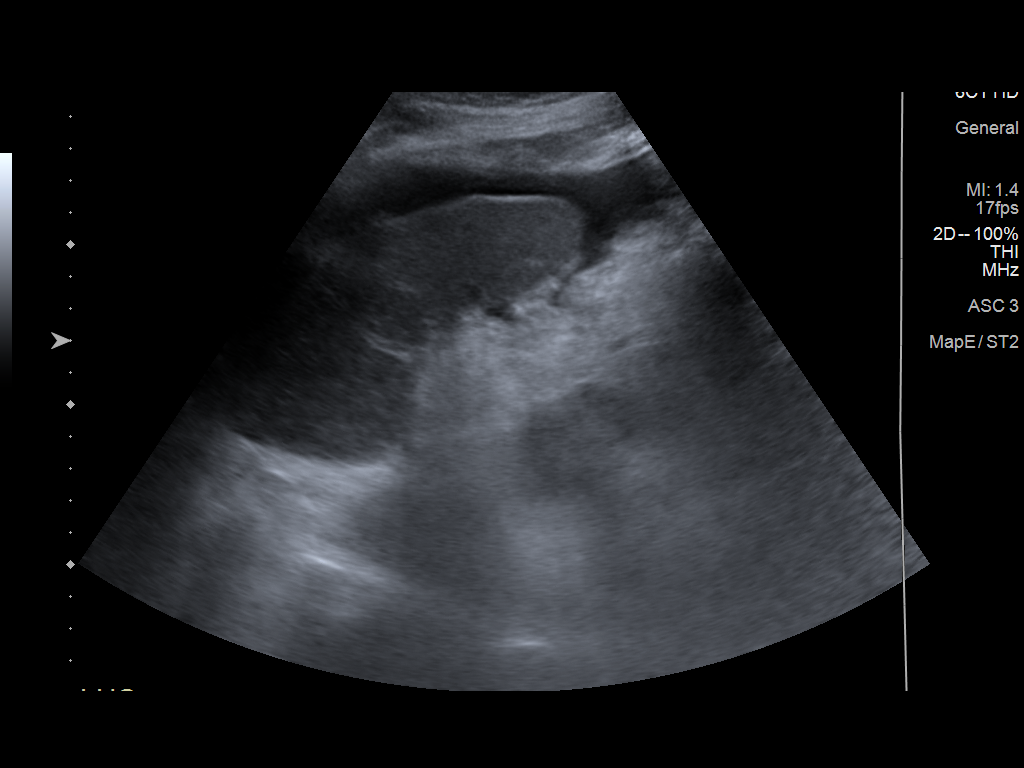
[im 7/7]
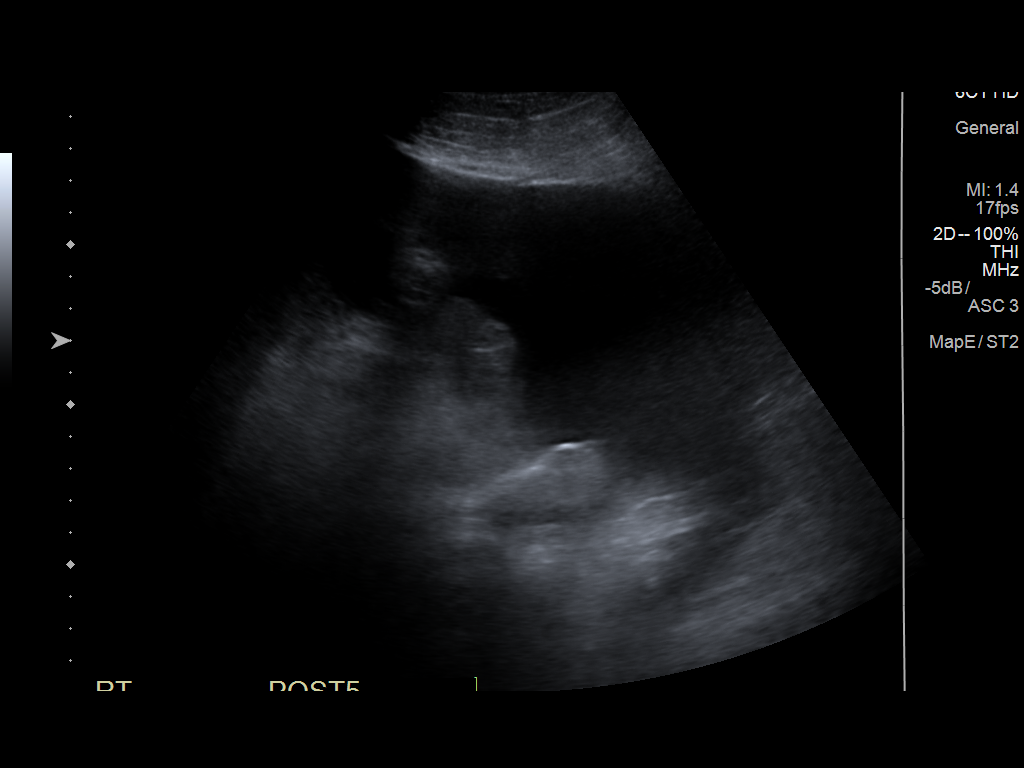

[7 of 7 positions shown; findings below may reference images not displayed]

EXAM:
ULTRASOUND GUIDED RIGHT LATERAL ABDOMEN PARACENTESIS

MEDICATIONS:
1% Lidocaine.

COMPLICATIONS:
None immediate.

PROCEDURE:
Informed written consent was obtained from the patient after a
discussion of the risks, benefits and alternatives to treatment. A
timeout was performed prior to the initiation of the procedure.

Initial ultrasound scanning demonstrates a large amount of ascites
within the right lateral abdomen. The right lateral abdomen was
prepped and draped in the usual sterile fashion. 1% lidocaine with
epinephrine was used for local anesthesia.

Following this, a 19 gauge, 7-cm, Yueh catheter was introduced. An
ultrasound image was saved for documentation purposes. The
paracentesis was performed. The catheter was removed and a dressing
was applied. The patient tolerated the procedure well without
immediate post procedural complication.
FINDINGS: A total of approximately 5 liters of clear yellow fluid was removed.
IMPRESSION: Successful ultrasound-guided paracentesis yielding 5 liters of
peritoneal fluid.

## 2017-03-17 NOTE — Progress Notes (Signed)
REVIEWED-NO ADDITIONAL RECOMMENDATIONS. 

## 2017-03-24 ENCOUNTER — Telehealth: Payer: Self-pay | Admitting: Gastroenterology

## 2017-03-24 MED ORDER — OXYCODONE HCL 10 MG PO TABS: 10.0000 mg | ORAL_TABLET | Freq: Three times a day (TID) | ORAL | 0 refills | Status: DC | PRN

## 2017-03-24 MED ORDER — LORAZEPAM 1 MG PO TABS: ORAL_TABLET | ORAL | 3 refills | Status: DC

## 2017-03-24 NOTE — Telephone Encounter (Signed)
Reviewed [place] controlled substance reporting database. Patient receiving narcotics only from Dr. Oneida Alar. With last refill as noted.  02/27/2017 12/09/2016 OXYCODONE HCL 10 MG TABLET [id number] 90 30 0 0 [id number] FIELDS Raeanne Gathers MD [place], [place] [id number] [facility] [place], [place] Wanda [name], Wanda [name] April 01, 1976 [address] [place], [place] [address] 04 45 01/29/2017 08/06/2016 LORAZEPAM 1 MG TABLET [id number] 30 15 4 3  [id number] FIELDS Raeanne Gathers MD [place], [place] [id number] [facility] [place], [place] Wanda [name], Wanda [name] 1976-05-28 [address] [place], [place] [address] 01 0

## 2017-03-24 NOTE — Telephone Encounter (Signed)
[phone] patient needs a prescription for oxycodone and her ativan, she is out completely of the latter.

## 2017-03-24 NOTE — Telephone Encounter (Signed)
Tried to call and Vm not set up.  The 2 prescriptions at front for pick up.

## 2017-03-24 NOTE — Telephone Encounter (Signed)
Forwarding to refill box in Dr. Nona Dell absence.

## 2017-03-24 NOTE — Addendum Note (Signed)
Addended by: Mahala Menghini on: 03/24/2017 04:12 PM   Modules accepted: Orders

## 2017-03-24 NOTE — Telephone Encounter (Signed)
Done

## 2017-03-25 NOTE — Telephone Encounter (Signed)
Tried to call, VM not set up.  

## 2017-03-26 NOTE — Telephone Encounter (Signed)
REVIEWED. AGREE. NO ADDITIONAL RECOMMENDATIONS. 

## 2017-03-31 ENCOUNTER — Telehealth: Payer: Self-pay | Admitting: Gastroenterology

## 2017-03-31 ENCOUNTER — Ambulatory Visit: Payer: Medicare Other | Admitting: Nurse Practitioner

## 2017-03-31 MED ORDER — ONDANSETRON HCL 4 MG PO TABS: 4.0000 mg | ORAL_TABLET | Freq: Three times a day (TID) | ORAL | 1 refills | Status: AC | PRN

## 2017-03-31 NOTE — Addendum Note (Signed)
Addended by: Gordy Levan, ERIC A on: 03/31/2017 03:27 PM   Modules accepted: Orders

## 2017-03-31 NOTE — Telephone Encounter (Signed)
Please tell the patient I sent in a prescription for Zofran to her pharmacy. I am avoiding Phenergan because it can make her drowsy which can complicate confusion related to liver and kidney disease.

## 2017-03-31 NOTE — Telephone Encounter (Signed)
Patient called asking if she could get something for nausea. Please advise

## 2017-03-31 NOTE — Progress Notes (Deleted)
Referring Provider: [name], MD Primary Care Physician:  [name], MD Primary GI:  Dr. Oneida Alar  No chief complaint on file.   HPI:   Wanda [name] is a 41 y.o. female who presents for follow-up. The patient is very well-known to our office with a history of cirrhosis and ascites as well as end-stage renal disease and GERD. She was last evaluated by [place] while admitted as an inpatient for hepatic encephalopathy from 01/12/2017 through 01/19/2017. She was admitted for altered mental status and found to have a creatinine of 20.34, ammonia of 311, and hypothermic. She was obtunded and only responds with moaning to sternal rub. EGD demonstrated no GI bleed. She was given 2 units of blood cells. Felt to be uremic with platelet dysfunction. She had missed dialysis for an unknown number of days. She had a bleeding ulcer of her leg which was treated as well. The patient began to wake up after lactulose orally and enema, antibiotics, dialysis, and other treatments. She was recommended not to miss dialysis. She was sent home with home health nursing, physical therapy, and a hospital bed. Overall her long-term prognosis was deemed to be very poor.  Review of phone note since discharge indicate the patient has been missing dialysis because "not feeling well." She was requesting large volume paracentesis. She was educated that paracentesis is no substituted for dialysis. She has not had a paracentesis since her hospital admission.  Today she states   Past Medical History:  Diagnosis Date  . Acute blood loss anemia 02/25/2014   Status post transfusion  . Acute renal failure ([facility]) 09/2013   Pre-renal- resolved  . Anasarca 10/10/2013  . Anxiety   . Bipolar disorder ([place]) 12/04/2013   2007-SEEN IN ED FOR INVOLUNTARY COMMITMENT, UDS POS FOR AMPHETAMINES/OPIATES   . Bleeding esophageal varices ([place]) 02/28/2014   s/p banding  . C. difficile colitis 04/19/2014  . Chronic hypotension   .  Cirrhosis ([place]) 10/05/13   Liver bx 11/23/13 (delayed initially due to patient refusal). c/w steatohepatitis  . Cirrhosis of liver with ascites ([place])   . Depression   . ESRD (end stage renal disease) on dialysis ([place]) 08/2014  . Folate deficiency 09/2013  . Gastroesophageal junction ulcer 09/17/2014  . GERD (gastroesophageal reflux disease)   . Hematemesis/vomiting blood 02/24/2014  . Macrocytosis 02/28/2014  . PNA (pneumonia) 10/13/2013  . SBP (spontaneous bacterial peritonitis) ([place]) 11/10/2013  . Thrombocytopenia ([place])    Hypercellular bone marrow; abundant megakaryocytes per 08/27/2014; s/p bone marrow bx    Past Surgical History:  Procedure Laterality Date  . AV FISTULA PLACEMENT Right 11/16/2014   Procedure: Right arm Creation of arteriovenous fistula;  Surgeon: Angelia Mould, MD;  Location: [facility];  Service: Vascular;  Laterality: Right;  . CENTRAL VENOUS CATHETER INSERTION Right   . COLONOSCOPY N/A 12/19/2013   SLF:NO OBVIOUS SOURCE FOR ANEMIA IDETIFIED/ONE COLON POLYP REMOVED/Small internal hemorrhoids  . ESOPHAGEAL BANDING  07/04/2014   Procedure: ESOPHAGEAL BANDING;  Surgeon: Daneil Dolin, MD;  [facility];  Service: Endoscopy;;  . ESOPHAGEAL BANDING N/A 07/24/2014   Procedure: ESOPHAGEAL BANDING (2 bands applied);  Surgeon: Danie Binder, MD;  Location: [facility];  Service: Endoscopy;  Laterality: N/A;  . ESOPHAGEAL BANDING N/A 07/23/2015   Procedure: ESOPHAGEAL BANDING;  Surgeon: Danie Binder, MD;  [facility];  Service: Endoscopy;  Laterality: N/A;  . ESOPHAGEAL BANDING N/A 03/04/2016   Procedure: ESOPHAGEAL BANDING;  Surgeon: Danie Binder,  MD;  [facility];  Service: Endoscopy;  Laterality: N/A;  . ESOPHAGEAL BANDING N/A 04/30/2016   Procedure: ESOPHAGEAL BANDING;  Surgeon: Daneil Dolin, MD;  [facility];  Service: Endoscopy;  Laterality: N/A;  . ESOPHAGEAL BANDING N/A 01/13/2017   Procedure: ESOPHAGEAL BANDING;  Surgeon:  Danie Binder, MD;  [facility];  Service: Endoscopy;  Laterality: N/A;  . ESOPHAGOGASTRODUODENOSCOPY N/A 11/14/2013   SLF:1 column of very small varices in distal esopahgus/MODERATE PORTAL GASTROPATHY IN PROXIMAL STOMACH/MODERATE erosive gastritis  . ESOPHAGOGASTRODUODENOSCOPY N/A 02/11/2014   Dr. Rourk:Esophageal varices with bleeding stigmata-status post esophageal band ligation therapy. Portal gastropathy  . ESOPHAGOGASTRODUODENOSCOPY N/A 07/04/2014   RMR: Persiting grade 2 esophageal varicies with bleeding stigmata status post band ligation. Significantly congested gastric mucosa iwith changes constistant with protal gastropathy.   . ESOPHAGOGASTRODUODENOSCOPY N/A 09/16/2014   Rehman: Single short column of varix proximal to GE junction not large enough to be banded. Two amall ulcers at the GEJ felt to be source of GI Bleeding but no active bleeding but no actibe bleeding noted. No therapy rendered. Portal gastropathy NO evidence of peptic ulcer diease or gastric varices.   . ESOPHAGOGASTRODUODENOSCOPY N/A 12/14/2014   SLF: Grade ! esophageal varices. 2. Moderate Portal Gastropathy  . ESOPHAGOGASTRODUODENOSCOPY N/A 03/27/2015   Procedure: ESOPHAGOGASTRODUODENOSCOPY (EGD);  Surgeon: Danie Binder, MD;  [facility];  Service: Endoscopy;  Laterality: N/A;  1045am - moved to 817 @ 11:30  . ESOPHAGOGASTRODUODENOSCOPY N/A 06/05/2015   Procedure: ESOPHAGOGASTRODUODENOSCOPY (EGD);  Surgeon: Clarene Essex, MD;  Location[facility];  Service: Endoscopy;  Laterality: N/A;  . ESOPHAGOGASTRODUODENOSCOPY N/A 07/23/2015   SLF:1. Grade 1 esophageal varices 2. Moderate portal hypertensive gastropathy 3. MILd non-erosive gastritis.   [name] ESOPHAGOGASTRODUODENOSCOPY N/A 03/04/2016   Dr. Oneida Alar: grade 1 varices, surveillance in Jan 2017   . ESOPHAGOGASTRODUODENOSCOPY N/A 05/21/2016   Dr. Carlean Purl: non-bleeding esophageal ulcer, likely secondary to MW tear  . ESOPHAGOGASTRODUODENOSCOPY N/A 01/13/2017    Procedure: ESOPHAGOGASTRODUODENOSCOPY (EGD);  Surgeon: Danie Binder, MD;  [facility];  Service: Endoscopy;  Laterality: N/A;  . ESOPHAGOGASTRODUODENOSCOPY (EGD) WITH PROPOFOL N/A 07/24/2014   SLF:  1. 2 columns grade 2-3 varices- 2 Bands applied.  2.  Moderate gastropathy 3. Duodenal Diverticula  . ESOPHAGOGASTRODUODENOSCOPY (EGD) WITH PROPOFOL N/A 10/26/2014   RMR: 2 columns of grade 2 esophageal varices without obvious bleeding stigmata status post band ligation to complet obliteration of remaining varices.   . ESOPHAGOGASTRODUODENOSCOPY (EGD) WITH PROPOFOL N/A 04/30/2016   Dr. Gala Romney: MW tear, varices obliterated, portal gastropathy  . None    . PARACENTESIS  Feb 2015   1180 fluid, negative fluid analysis.   [name] PARACENTESIS  10/2013    Current Outpatient Prescriptions  Medication Sig Dispense Refill  . amoxicillin-clavulanate (AUGMENTIN) 500-125 MG tablet Take 1 tablet (500 mg total) by mouth daily. 7 tablet 0  . augmented betamethasone dipropionate (DIPROLENE-AF) 0.05 % cream APPLY TO THE AFFECTED AREAS ON LEGS TWICE DAILY AS NEEDED (NOT FACE, GROIN, OR UNDER ARMS)  3  . chlorhexidine gluconate, MEDLINE KIT, (PERIDEX) 0.12 % solution 15 mLs by Mouth Rinse route 2 (two) times daily. 120 mL 0  . clotrimazole (LOTRIMIN) 1 % cream Apply 1 application topically 3 (three) times daily. 12 g 1  . Darbepoetin Alfa (ARANESP) 60 MCG/0.3ML SOSY injection Inject 0.3 mLs (60 mcg total) into the vein every Thursday with hemodialysis. (Patient taking differently: Inject 60 mcg into the vein every Tuesday with  hemodialysis. ) 4.2 mL   . lactulose (CHRONULAC) 10 GM/15ML solution Take 15-30 mLs (10-20 g total) by mouth 3 (three) times daily. TAKE 15-30 ml BY MOUTH three times a day    . lidocaine (XYLOCAINE) 2 % solution TAKE TWO TEASPOONSFUL (10ML) BY MOUTH BEFORE MEALS AND AT BEDTIME TO PREVENT CHEST PAIN WHILE EATING 500 mL 1  . lidocaine-prilocaine (EMLA) cream Apply 1 application topically  as needed (for dialysis treatments).    . LORazepam (ATIVAN) 1 MG tablet Take 1/2 to 1 tablet by mouth twice daily as needed for back spasm or for anxiety. 30 tablet 3  . midodrine (PROAMATINE) 5 MG tablet TAKE TWO (2) TABLETS BY MOUTH DAILY. TAKE ONLY ON DIALYSIS DAYS ON TUESDAYS, THURDAYS, AND SATURDAYS (SOMETIME TAKES TREATMENTS ON FRIDAY IN  3  . omeprazole (PRILOSEC) 20 MG capsule 1 po every morning 30 minutes prior to your first meal. (Patient taking differently: Take 20 mg by mouth daily before breakfast. 1 po every morning 30 minutes prior to your first meal.) 31 capsule 11  . Oxycodone HCl 10 MG TABS Take 1 tablet (10 mg total) by mouth 3 (three) times daily as needed. FOR PAIN 90 tablet 0  . pantoprazole (PROTONIX) 40 MG tablet Take 1 tablet (40 mg total) by mouth daily at 6 (six) AM. 30 tablet 1  . RENVELA 800 MG tablet Take 1600 mg by mouth 3 times daily with meals. Take 800 mg by mouth with snacks.  5  . sucralfate (CARAFATE) 1 GM/10ML suspension Take 1 g by mouth 4 (four) times daily -  with meals and at bedtime.     No current facility-administered medications for this visit.     Allergies as of 03/31/2017 - Review Complete 03/01/2017  Allergen Reaction Noted  . Iohexol Rash 11/23/2016  . Lasix [furosemide] Other (See Comments) 04/12/2014  . Latex Itching 05/14/2015    Family History  Problem Relation Age of Onset  . Heart disease Mother   . Colon cancer Neg Hx   . Liver disease Neg Hx     Social History   Social History  . Marital status: Single    Spouse name: N/A  . Number of children: 1  . Years of education: N/A   Occupational History  . unemployed    Social History Main Topics  . Smoking status: Former Smoker    Packs/day: 0.25    Years: 20.00    Types: Cigarettes    Quit date: 11/05/2008  . Smokeless tobacco: Never Used     Comment: Never really smoked much  . Alcohol use No  . Drug use: No  . Sexual activity: Not Currently    Partners: Male     Birth control/ protection: None   Other Topics Concern  . Not on file   Social History Narrative  . No narrative on file    Review of Systems: General: Negative for anorexia, weight loss, fever, chills, fatigue, weakness. Eyes: Negative for vision changes.  ENT: Negative for hoarseness, difficulty swallowing , nasal congestion. CV: Negative for chest pain, angina, palpitations, dyspnea on exertion, peripheral edema.  Respiratory: Negative for dyspnea at rest, dyspnea on exertion, cough, sputum, wheezing.  GI: See history of present illness. GU:  Negative for dysuria, hematuria, urinary incontinence, urinary frequency, nocturnal urination.  MS: Negative for joint pain, low back pain.  Derm: Negative for rash or itching.  Neuro: Negative for weakness, abnormal sensation, seizure, frequent headaches, memory loss, confusion.  Psych: Negative for  anxiety, depression, suicidal ideation, hallucinations.  Endo: Negative for unusual weight change.  Heme: Negative for bruising or bleeding. Allergy: Negative for rash or hives.   Physical Exam: LMP 09/21/2013  General:   Alert and oriented. Pleasant and cooperative. Well-nourished and well-developed.  Head:  Normocephalic and atraumatic. Eyes:  Without icterus, sclera clear and conjunctiva pink.  Ears:  Normal auditory acuity. Mouth:  No deformity or lesions, oral mucosa pink.  Throat/Neck:  Supple, without mass or thyromegaly. Cardiovascular:  S1, S2 present without murmurs appreciated. Normal pulses noted. Extremities without clubbing or edema. Respiratory:  Clear to auscultation bilaterally. No wheezes, rales, or rhonchi. No distress.  Gastrointestinal:  +BS, soft, non-tender and non-distended. No HSM noted. No guarding or rebound. No masses appreciated.  Rectal:  Deferred  Musculoskalatal:  Symmetrical without gross deformities. Normal posture. Skin:  Intact without significant lesions or rashes. Neurologic:  Alert and oriented x4;   grossly normal neurologically. Psych:  Alert and cooperative. Normal mood and affect. Heme/Lymph/Immune: No significant cervical adenopathy. No excessive bruising noted.    03/31/2017 1:13 PM   Disclaimer: This note was dictated with voice recognition software. Similar sounding words can inadvertently be transcribed and may not be corrected upon review.

## 2017-03-31 NOTE — Telephone Encounter (Signed)
Forwarding to refill box.  

## 2017-03-31 NOTE — Telephone Encounter (Signed)
Tried to call and Vm not set up.

## 2017-04-04 ENCOUNTER — Emergency Department (HOSPITAL_COMMUNITY): Payer: Medicare Other

## 2017-04-04 ENCOUNTER — Encounter (HOSPITAL_COMMUNITY): Payer: Self-pay | Admitting: Emergency Medicine

## 2017-04-04 ENCOUNTER — Inpatient Hospital Stay (HOSPITAL_COMMUNITY)
Admission: EM | Admit: 2017-04-04 | Discharge: 2017-04-15 | DRG: 640 | Disposition: A | Discharge status: Hospice | Payer: Medicare Other | Attending: Internal Medicine | Admitting: Internal Medicine

## 2017-04-04 DIAGNOSIS — I9589 Other hypotension: Secondary | ICD-10-CM | POA: Diagnosis present

## 2017-04-04 DIAGNOSIS — M25551 Pain in right hip: Secondary | ICD-10-CM | POA: Diagnosis present

## 2017-04-04 DIAGNOSIS — Z515 Encounter for palliative care: Secondary | ICD-10-CM | POA: Diagnosis not present

## 2017-04-04 DIAGNOSIS — W19XXXA Unspecified fall, initial encounter: Secondary | ICD-10-CM | POA: Diagnosis present

## 2017-04-04 DIAGNOSIS — D638 Anemia in other chronic diseases classified elsewhere: Secondary | ICD-10-CM

## 2017-04-04 DIAGNOSIS — K3189 Other diseases of stomach and duodenum: Secondary | ICD-10-CM | POA: Diagnosis present

## 2017-04-04 DIAGNOSIS — Z8249 Family history of ischemic heart disease and other diseases of the circulatory system: Secondary | ICD-10-CM

## 2017-04-04 DIAGNOSIS — Z91040 Latex allergy status: Secondary | ICD-10-CM

## 2017-04-04 DIAGNOSIS — Z888 Allergy status to other drugs, medicaments and biological substances status: Secondary | ICD-10-CM | POA: Diagnosis not present

## 2017-04-04 DIAGNOSIS — E8770 Fluid overload, unspecified: Secondary | ICD-10-CM | POA: Diagnosis present

## 2017-04-04 DIAGNOSIS — N19 Unspecified kidney failure: Secondary | ICD-10-CM | POA: Diagnosis present

## 2017-04-04 DIAGNOSIS — E872 Acidosis: Secondary | ICD-10-CM | POA: Diagnosis present

## 2017-04-04 DIAGNOSIS — F319 Bipolar disorder, unspecified: Secondary | ICD-10-CM | POA: Diagnosis present

## 2017-04-04 DIAGNOSIS — Z7189 Other specified counseling: Secondary | ICD-10-CM | POA: Diagnosis not present

## 2017-04-04 DIAGNOSIS — R601 Generalized edema: Secondary | ICD-10-CM

## 2017-04-04 DIAGNOSIS — R188 Other ascites: Secondary | ICD-10-CM | POA: Diagnosis present

## 2017-04-04 DIAGNOSIS — K7290 Hepatic failure, unspecified without coma: Secondary | ICD-10-CM | POA: Diagnosis present

## 2017-04-04 DIAGNOSIS — Y92009 Unspecified place in unspecified non-institutional (private) residence as the place of occurrence of the external cause: Secondary | ICD-10-CM | POA: Diagnosis not present

## 2017-04-04 DIAGNOSIS — K7682 Hepatic encephalopathy: Secondary | ICD-10-CM | POA: Diagnosis present

## 2017-04-04 DIAGNOSIS — Z8711 Personal history of peptic ulcer disease: Secondary | ICD-10-CM | POA: Diagnosis not present

## 2017-04-04 DIAGNOSIS — Z9115 Patient's noncompliance with renal dialysis: Secondary | ICD-10-CM

## 2017-04-04 DIAGNOSIS — K7581 Nonalcoholic steatohepatitis (NASH): Secondary | ICD-10-CM | POA: Diagnosis not present

## 2017-04-04 DIAGNOSIS — K7460 Unspecified cirrhosis of liver: Secondary | ICD-10-CM | POA: Diagnosis present

## 2017-04-04 DIAGNOSIS — K766 Portal hypertension: Secondary | ICD-10-CM | POA: Diagnosis present

## 2017-04-04 DIAGNOSIS — D696 Thrombocytopenia, unspecified: Secondary | ICD-10-CM | POA: Diagnosis not present

## 2017-04-04 DIAGNOSIS — E871 Hypo-osmolality and hyponatremia: Secondary | ICD-10-CM | POA: Diagnosis present

## 2017-04-04 DIAGNOSIS — N186 End stage renal disease: Secondary | ICD-10-CM | POA: Diagnosis present

## 2017-04-04 DIAGNOSIS — Z9119 Patient's noncompliance with other medical treatment and regimen: Secondary | ICD-10-CM

## 2017-04-04 DIAGNOSIS — Z87891 Personal history of nicotine dependence: Secondary | ICD-10-CM

## 2017-04-04 DIAGNOSIS — Z992 Dependence on renal dialysis: Secondary | ICD-10-CM | POA: Diagnosis not present

## 2017-04-04 DIAGNOSIS — R7989 Other specified abnormal findings of blood chemistry: Secondary | ICD-10-CM | POA: Diagnosis not present

## 2017-04-04 DIAGNOSIS — K219 Gastro-esophageal reflux disease without esophagitis: Secondary | ICD-10-CM | POA: Diagnosis present

## 2017-04-04 DIAGNOSIS — L8990 Pressure ulcer of unspecified site, unspecified stage: Secondary | ICD-10-CM | POA: Diagnosis present

## 2017-04-04 LAB — CBC WITH DIFFERENTIAL/PLATELET
BASOS ABS: 0 10*3/uL (ref 0.0–0.1)
Basophils Relative: 1 %
EOS ABS: 0.2 10*3/uL (ref 0.0–0.7)
Eosinophils Relative: 3 %
HCT: 23.6 % — ABNORMAL LOW (ref 36.0–46.0)
HEMOGLOBIN: 7.8 g/dL — AB (ref 12.0–15.0)
LYMPHS PCT: 9 %
Lymphs Abs: 0.7 10*3/uL (ref 0.7–4.0)
MCH: 31 pg (ref 26.0–34.0)
MCHC: 33.1 g/dL (ref 30.0–36.0)
MCV: 93.7 fL (ref 78.0–100.0)
Monocytes Absolute: 0.5 10*3/uL (ref 0.1–1.0)
Monocytes Relative: 6 %
Neutro Abs: 6.2 10*3/uL (ref 1.7–7.7)
Neutrophils Relative %: 82 %
PLATELETS: 95 10*3/uL — AB (ref 150–400)
RBC: 2.52 MIL/uL — AB (ref 3.87–5.11)
RDW: 16.3 % — ABNORMAL HIGH (ref 11.5–15.5)
WBC: 7.6 10*3/uL (ref 4.0–10.5)

## 2017-04-04 LAB — CK: Total CK: 483 U/L — ABNORMAL HIGH (ref 38–234)

## 2017-04-04 LAB — COMPREHENSIVE METABOLIC PANEL
ALBUMIN: 2.7 g/dL — AB (ref 3.5–5.0)
ALT: 9 U/L — AB (ref 14–54)
ANION GAP: 29 — AB (ref 5–15)
AST: 23 U/L (ref 15–41)
Alkaline Phosphatase: 96 U/L (ref 38–126)
BUN: 147 mg/dL — ABNORMAL HIGH (ref 6–20)
CHLORIDE: 100 mmol/L — AB (ref 101–111)
CO2: 13 mmol/L — AB (ref 22–32)
Calcium: 5.4 mg/dL — CL (ref 8.9–10.3)
Creatinine, Ser: 20.66 mg/dL — ABNORMAL HIGH (ref 0.44–1.00)
GFR calc non Af Amer: 2 mL/min — ABNORMAL LOW (ref >60)
GFR, EST AFRICAN AMERICAN: 2 mL/min — AB (ref >60)
GLUCOSE: 93 mg/dL (ref 65–99)
Potassium: 4.9 mmol/L (ref 3.5–5.1)
SODIUM: 142 mmol/L (ref 135–145)
Total Bilirubin: 1.4 mg/dL — ABNORMAL HIGH (ref 0.3–1.2)
Total Protein: 6.5 g/dL (ref 6.5–8.1)

## 2017-04-04 LAB — MRSA PCR SCREENING: MRSA by PCR: NEGATIVE

## 2017-04-04 LAB — AMMONIA: Ammonia: 90 umol/L — ABNORMAL HIGH (ref 9–35)

## 2017-04-04 MED ORDER — OXYCODONE HCL 5 MG PO TABS
10.0000 mg | ORAL_TABLET | Freq: Three times a day (TID) | ORAL | Status: DC | PRN
  Administered 2017-04-05 (×2): 10 mg via ORAL
  Filled 2017-04-04 (×3): qty 2

## 2017-04-04 MED ORDER — SODIUM CHLORIDE 0.9 % IV SOLN: 100.0000 mL | INTRAVENOUS | Status: DC | PRN

## 2017-04-04 MED ORDER — ALTEPLASE 2 MG IJ SOLR
2.0000 mg | Freq: Once | INTRAMUSCULAR | Status: DC | PRN
  Filled 2017-04-04: qty 2

## 2017-04-04 MED ORDER — CODEINE SULFATE 30 MG PO TABS: 30.0000 mg | ORAL_TABLET | Freq: Once | ORAL | Status: DC

## 2017-04-04 MED ORDER — LIDOCAINE HCL (PF) 1 % IJ SOLN: 5.0000 mL | INTRAMUSCULAR | Status: DC | PRN

## 2017-04-04 MED ORDER — SEVELAMER CARBONATE 800 MG PO TABS
1600.0000 mg | ORAL_TABLET | Freq: Three times a day (TID) | ORAL | Status: DC
  Filled 2017-04-04 (×11): qty 2

## 2017-04-04 MED ORDER — MIDODRINE HCL 5 MG PO TABS
10.0000 mg | ORAL_TABLET | Freq: Every day | ORAL | Status: DC
  Administered 2017-04-05 – 2017-04-07 (×3): 10 mg via ORAL
  Filled 2017-04-04 (×3): qty 2

## 2017-04-04 MED ORDER — EPOETIN ALFA 10000 UNIT/ML IJ SOLN
10000.0000 [IU] | Freq: Once | INTRAMUSCULAR | Status: AC
  Administered 2017-04-04: 10000 [IU] via SUBCUTANEOUS
  Filled 2017-04-04: qty 1

## 2017-04-04 MED ORDER — ONDANSETRON HCL 4 MG/2ML IJ SOLN
4.0000 mg | Freq: Four times a day (QID) | INTRAMUSCULAR | Status: DC | PRN
  Administered 2017-04-05 – 2017-04-14 (×2): 4 mg via INTRAVENOUS
  Filled 2017-04-04 (×2): qty 2

## 2017-04-04 MED ORDER — EPOETIN ALFA 10000 UNIT/ML IJ SOLN
INTRAMUSCULAR | Status: AC
  Administered 2017-04-04: 10000 [IU] via SUBCUTANEOUS
  Filled 2017-04-04: qty 1

## 2017-04-04 MED ORDER — LIDOCAINE-PRILOCAINE 2.5-2.5 % EX CREA
1.0000 "application " | TOPICAL_CREAM | CUTANEOUS | Status: DC | PRN
  Filled 2017-04-04: qty 5

## 2017-04-04 MED ORDER — ACETAMINOPHEN 650 MG RE SUPP: 650.0000 mg | Freq: Four times a day (QID) | RECTAL | Status: DC | PRN

## 2017-04-04 MED ORDER — MORPHINE SULFATE (PF) 2 MG/ML IV SOLN
2.0000 mg | INTRAVENOUS | Status: DC | PRN
  Administered 2017-04-04 – 2017-04-05 (×5): 2 mg via INTRAVENOUS
  Filled 2017-04-04 (×5): qty 1

## 2017-04-04 MED ORDER — SUCRALFATE 1 GM/10ML PO SUSP
1.0000 g | Freq: Three times a day (TID) | ORAL | Status: DC
  Administered 2017-04-04 – 2017-04-15 (×42): 1 g via ORAL
  Filled 2017-04-04 (×45): qty 10

## 2017-04-04 MED ORDER — PANTOPRAZOLE SODIUM 40 MG PO TBEC
40.0000 mg | DELAYED_RELEASE_TABLET | Freq: Every day | ORAL | Status: DC
  Administered 2017-04-07: 40 mg via ORAL
  Filled 2017-04-04 (×5): qty 1

## 2017-04-04 MED ORDER — HYDROCODONE-ACETAMINOPHEN 5-325 MG PO TABS
1.0000 | ORAL_TABLET | Freq: Once | ORAL | Status: AC
Start: 2017-04-04 — End: 2017-04-04
  Administered 2017-04-04: 1 via ORAL
  Filled 2017-04-04: qty 1

## 2017-04-04 MED ORDER — LIDOCAINE VISCOUS 2 % MT SOLN
10.0000 mL | Freq: Three times a day (TID) | OROMUCOSAL | Status: DC
  Administered 2017-04-04 – 2017-04-15 (×40): 10 mL via OROMUCOSAL
  Filled 2017-04-04 (×43): qty 15

## 2017-04-04 MED ORDER — ACETAMINOPHEN 325 MG PO TABS
650.0000 mg | ORAL_TABLET | Freq: Four times a day (QID) | ORAL | Status: DC | PRN
  Administered 2017-04-08 – 2017-04-09 (×2): 650 mg via ORAL
  Filled 2017-04-04 (×2): qty 2

## 2017-04-04 MED ORDER — PENTAFLUOROPROP-TETRAFLUOROETH EX AERO
1.0000 "application " | INHALATION_SPRAY | CUTANEOUS | Status: DC | PRN
  Filled 2017-04-04: qty 30

## 2017-04-04 MED ORDER — LACTULOSE 10 GM/15ML PO SOLN
10.0000 g | Freq: Three times a day (TID) | ORAL | Status: DC
  Administered 2017-04-06: 20 g via ORAL
  Filled 2017-04-04 (×10): qty 30

## 2017-04-04 MED ORDER — TRIAMCINOLONE ACETONIDE 0.5 % EX CREA
TOPICAL_CREAM | Freq: Two times a day (BID) | CUTANEOUS | Status: DC
  Administered 2017-04-05: 22:00:00 via TOPICAL
  Filled 2017-04-04 (×2): qty 15

## 2017-04-04 MED ORDER — ONDANSETRON HCL 4 MG PO TABS
4.0000 mg | ORAL_TABLET | Freq: Four times a day (QID) | ORAL | Status: DC | PRN
  Administered 2017-04-04 – 2017-04-10 (×4): 4 mg via ORAL
  Filled 2017-04-04 (×4): qty 1

## 2017-04-04 MED ORDER — MIDODRINE HCL 5 MG PO TABS
10.0000 mg | ORAL_TABLET | Freq: Every day | ORAL | Status: DC
  Administered 2017-04-04: 10 mg via ORAL
  Filled 2017-04-04: qty 2

## 2017-04-04 MED ORDER — MORPHINE SULFATE (PF) 2 MG/ML IV SOLN
2.0000 mg | Freq: Once | INTRAVENOUS | Status: AC
  Administered 2017-04-04: 2 mg via INTRAVENOUS
  Filled 2017-04-04: qty 1

## 2017-04-04 MED ORDER — LORAZEPAM 0.5 MG PO TABS
0.5000 mg | ORAL_TABLET | Freq: Two times a day (BID) | ORAL | Status: DC | PRN
  Administered 2017-04-04 – 2017-04-15 (×16): 0.5 mg via ORAL
  Filled 2017-04-04 (×18): qty 1

## 2017-04-04 MED ORDER — HEPARIN SODIUM (PORCINE) 1000 UNIT/ML DIALYSIS
1000.0000 [IU] | INTRAMUSCULAR | Status: DC | PRN
  Filled 2017-04-04: qty 1

## 2017-04-04 NOTE — ED Notes (Signed)
CRITICAL VALUE ALERT  Critical Value:  Calcium 5.4  Date & Time Notied:  04/04/2017 6016  Provider Notified: Roderic Palau  Orders Received/Actions taken: No orders received at this time

## 2017-04-04 NOTE — ED Notes (Signed)
Report given to Erica RN

## 2017-04-04 NOTE — H&P (Signed)
History and Physical    Wanda [name] BEM:[id number] DOB: [birth year] DOA: 04/04/2017  PCP: [name], MD  Patient coming from: Home  I have personally briefly reviewed patient's old medical records in [place]  Chief Complaint: Fall  HPI: Wanda [name] is a 41 y.o. female with medical history significant of end-stage renal disease on hemodialysis, [name] cirrhosis, who is a long history of noncompliance with dialysis and meds. Patient is apparently not had dialysis in over one month. She reports progressive lower extremity edema, dyspnea on exertion. Last night, she reports walking without the help of her walker and lost her balance leading to a fall. She fell on her right side. Is unable to stand regularly. She called her significant other went to get help. She feels that she may have been laying on the floor for approximately 1 hour. She did not lose consciousness or hit her head. She's not had any fever or significant change in cough recently. No vomiting or diarrhea.  ED Course: She is noted to be markedly edematous. Bowels are somewhat stable. Creatinine elevated at 20.6 . BUN 47. Bicarbonate 13. Hemoglobin low at 7.8 which is near her baseline. Platelets of 95. Calcium 5.4. Imaging of pelvis and chest did not show any acute findings. Patient has been referred for admission  Review of Systems: As per HPI otherwise 10 point review of systems negative.    Past Medical History:  Diagnosis Date  . Acute blood loss anemia 02/25/2014   Status post transfusion  . Acute renal failure ([place]) 09/2013   Pre-renal- resolved  . Anasarca 10/10/2013  . Anxiety   . Bipolar disorder ([place]) 12/04/2013   2007-SEEN IN ED FOR INVOLUNTARY COMMITMENT, UDS POS FOR AMPHETAMINES/OPIATES   . Bleeding esophageal varices ([place]) 02/28/2014   s/p banding  . C. difficile colitis 04/19/2014  . Chronic hypotension   . Cirrhosis ([place]) 10/05/13   Liver bx 11/23/13 (delayed initially due to patient refusal).  c/w steatohepatitis  . Cirrhosis of liver with ascites ([place])   . Depression   . ESRD (end stage renal disease) on dialysis ([place]) 08/2014  . Folate deficiency 09/2013  . Gastroesophageal junction ulcer 09/17/2014  . GERD (gastroesophageal reflux disease)   . Hematemesis/vomiting blood 02/24/2014  . Macrocytosis 02/28/2014  . PNA (pneumonia) 10/13/2013  . SBP (spontaneous bacterial peritonitis) ([place]) 11/10/2013  . Thrombocytopenia ([place])    Hypercellular bone marrow; abundant megakaryocytes per 08/27/2014; s/p bone marrow bx    Past Surgical History:  Procedure Laterality Date  . AV FISTULA PLACEMENT Right 11/16/2014   Procedure: Right arm Creation of arteriovenous fistula;  Surgeon: Angelia Mould, MD;  Location: [facility];  Service: Vascular;  Laterality: Right;  . CENTRAL VENOUS CATHETER INSERTION Right   . COLONOSCOPY N/A 12/19/2013   SLF:NO OBVIOUS SOURCE FOR ANEMIA IDETIFIED/ONE COLON POLYP REMOVED/Small internal hemorrhoids  . ESOPHAGEAL BANDING  07/04/2014   Procedure: ESOPHAGEAL BANDING;  Surgeon: Daneil Dolin, MD;  [facility];  Service: Endoscopy;;  . ESOPHAGEAL BANDING N/A 07/24/2014   Procedure: ESOPHAGEAL BANDING (2 bands applied);  Surgeon: Danie Binder, MD;  Location: [facility];  Service: Endoscopy;  Laterality: N/A;  . ESOPHAGEAL BANDING N/A 07/23/2015   Procedure: ESOPHAGEAL BANDING;  Surgeon: Danie Binder, MD;  [facility];  Service: Endoscopy;  Laterality: N/A;  . ESOPHAGEAL BANDING N/A 03/04/2016   Procedure: ESOPHAGEAL BANDING;  Surgeon: Danie Binder, MD;  [facility];  Service: Endoscopy;  Laterality:  N/A;  . ESOPHAGEAL BANDING N/A 04/30/2016   Procedure: ESOPHAGEAL BANDING;  Surgeon: Daneil Dolin, MD;  [facility];  Service: Endoscopy;  Laterality: N/A;  . ESOPHAGEAL BANDING N/A 01/13/2017   Procedure: ESOPHAGEAL BANDING;  Surgeon: Danie Binder, MD;  [facility];  Service: Endoscopy;  Laterality: N/A;  .  ESOPHAGOGASTRODUODENOSCOPY N/A 11/14/2013   SLF:1 column of very small varices in distal esopahgus/MODERATE PORTAL GASTROPATHY IN PROXIMAL STOMACH/MODERATE erosive gastritis  . ESOPHAGOGASTRODUODENOSCOPY N/A 02/11/2014   Dr. Rourk:Esophageal varices with bleeding stigmata-status post esophageal band ligation therapy. Portal gastropathy  . ESOPHAGOGASTRODUODENOSCOPY N/A 07/04/2014   RMR: Persiting grade 2 esophageal varicies with bleeding stigmata status post band ligation. Significantly congested gastric mucosa iwith changes constistant with protal gastropathy.   . ESOPHAGOGASTRODUODENOSCOPY N/A 09/16/2014   Rehman: Single short column of varix proximal to GE junction not large enough to be banded. Two amall ulcers at the GEJ felt to be source of GI Bleeding but no active bleeding but no actibe bleeding noted. No therapy rendered. Portal gastropathy NO evidence of peptic ulcer diease or gastric varices.   . ESOPHAGOGASTRODUODENOSCOPY N/A 12/14/2014   SLF: Grade ! esophageal varices. 2. Moderate Portal Gastropathy  . ESOPHAGOGASTRODUODENOSCOPY N/A 03/27/2015   Procedure: ESOPHAGOGASTRODUODENOSCOPY (EGD);  Surgeon: Danie Binder, MD;  [facility];  Service: Endoscopy;  Laterality: N/A;  1045am - moved to 817 @ 11:30  . ESOPHAGOGASTRODUODENOSCOPY N/A 06/05/2015   Procedure: ESOPHAGOGASTRODUODENOSCOPY (EGD);  Surgeon: Clarene Essex, MD;  Location[facility];  Service: Endoscopy;  Laterality: N/A;  . ESOPHAGOGASTRODUODENOSCOPY N/A 07/23/2015   SLF:1. Grade 1 esophageal varices 2. Moderate portal hypertensive gastropathy 3. MILd non-erosive gastritis.   [name] ESOPHAGOGASTRODUODENOSCOPY N/A 03/04/2016   Dr. Oneida Alar: grade 1 varices, surveillance in Jan 2017   . ESOPHAGOGASTRODUODENOSCOPY N/A 05/21/2016   Dr. Carlean Purl: non-bleeding esophageal ulcer, likely secondary to MW tear  . ESOPHAGOGASTRODUODENOSCOPY N/A 01/13/2017   Procedure: ESOPHAGOGASTRODUODENOSCOPY (EGD);  Surgeon: Danie Binder, MD;  [facility];  Service: Endoscopy;  Laterality: N/A;  . ESOPHAGOGASTRODUODENOSCOPY (EGD) WITH PROPOFOL N/A 07/24/2014   SLF:  1. 2 columns grade 2-3 varices- 2 Bands applied.  2.  Moderate gastropathy 3. Duodenal Diverticula  . ESOPHAGOGASTRODUODENOSCOPY (EGD) WITH PROPOFOL N/A 10/26/2014   RMR: 2 columns of grade 2 esophageal varices without obvious bleeding stigmata status post band ligation to complet obliteration of remaining varices.   . ESOPHAGOGASTRODUODENOSCOPY (EGD) WITH PROPOFOL N/A 04/30/2016   Dr. Gala Romney: MW tear, varices obliterated, portal gastropathy  . None    . PARACENTESIS  Feb 2015   1180 fluid, negative fluid analysis.   [name] PARACENTESIS  10/2013     reports that she quit smoking about 8 years ago. Her smoking use included Cigarettes. She has a 5.00 pack-year smoking history. She has never used smokeless tobacco. She reports that she does not drink alcohol or use drugs.  Allergies  Allergen Reactions  . Prednisone Swelling  . Iohexol Rash    Pt states rash after last CT. Pt needs pre-meds  . Lasix [Furosemide] Other (See Comments)    "doesn't work"  . Latex Itching    Family History  Problem Relation Age of Onset  . Heart disease Mother   . Colon cancer Neg Hx   . Liver disease Neg Hx     Prior to Admission medications   Medication Sig Start Date End Date Taking? Authorizing Provider  lidocaine-prilocaine (EMLA) cream Apply 1 application topically as needed (for dialysis  treatments).   Yes [provider]  LORazepam (ATIVAN) 1 MG tablet Take 1/2 to 1 tablet by mouth twice daily as needed for back spasm or for anxiety. 03/24/17  Yes Mahala Menghini, PA-C  Oxycodone HCl 10 MG TABS Take 1 tablet (10 mg total) by mouth 3 (three) times daily as needed. FOR PAIN 03/24/17  Yes Mahala Menghini, PA-C  augmented betamethasone dipropionate (DIPROLENE-AF) 0.05 % cream APPLY TO THE AFFECTED AREAS ON LEGS TWICE DAILY AS NEEDED (NOT FACE, GROIN, OR UNDER ARMS) 12/24/16    [provider]  chlorhexidine gluconate, MEDLINE KIT, (PERIDEX) 0.12 % solution 15 mLs by Mouth Rinse route 2 (two) times daily. 01/19/17   Orvan Falconer, MD  clotrimazole (LOTRIMIN) 1 % cream Apply 1 application topically 3 (three) times daily. 12/25/16   Annitta Needs, NP  Darbepoetin Alfa (ARANESP) 60 MCG/0.3ML SOSY injection Inject 0.3 mLs (60 mcg total) into the vein every Thursday with hemodialysis. Patient taking differently: Inject 60 mcg into the vein every Tuesday with hemodialysis.  06/09/15   Cherene Altes, MD  lactulose (CHRONULAC) 10 GM/15ML solution Take 15-30 mLs (10-20 g total) by mouth 3 (three) times daily. TAKE 15-30 ml BY MOUTH three times a day 12/21/16   Samuella Cota, MD  lidocaine (XYLOCAINE) 2 % solution TAKE TWO TEASPOONSFUL (10ML) BY MOUTH BEFORE MEALS AND AT BEDTIME TO PREVENT CHEST PAIN WHILE EATING 03/03/17   Annitta Needs, NP  midodrine (PROAMATINE) 5 MG tablet TAKE TWO (2) TABLETS BY MOUTH DAILY. TAKE ONLY ON DIALYSIS DAYS ON TUESDAYS, THURDAYS, AND SATURDAYS (SOMETIME TAKES TREATMENTS ON FRIDAY IN 12/29/16   [provider]  omeprazole (PRILOSEC) 20 MG capsule 1 po every morning 30 minutes prior to your first meal. Patient taking differently: Take 20 mg by mouth daily before breakfast. 1 po every morning 30 minutes prior to your first meal. 09/30/16   Fields, Marga Melnick, MD  ondansetron (ZOFRAN) 4 MG tablet Take 1 tablet (4 mg total) by mouth every 8 (eight) hours as needed for nausea or vomiting. 03/31/17   Carlis Stable, NP  pantoprazole (PROTONIX) 40 MG tablet Take 1 tablet (40 mg total) by mouth daily at 6 (six) AM. 01/20/17   Orvan Falconer, MD  RENVELA 800 MG tablet Take 1600 mg by mouth 3 times daily with meals. Take 800 mg by mouth with snacks. 10/29/15   [provider]  sucralfate (CARAFATE) 1 GM/10ML suspension Take 1 g by mouth 4 (four) times daily -  with meals and at bedtime.    [provider]    Physical Exam: Vitals:    04/04/17 1600 04/04/17 1630 04/04/17 1700 04/04/17 1701  BP: (!) 111/50  95/68   Pulse: (!) 106  (!) 105   Resp: 11  15   Temp:  97.7 F (36.5 C)  97.8 F (36.6 C)  TempSrc:  Oral  Oral  SpO2: 100%  100%   Weight:      Height:        Constitutional: NAD, calm, comfortable Vitals:   04/04/17 1600 04/04/17 1630 04/04/17 1700 04/04/17 1701  BP: (!) 111/50  95/68   Pulse: (!) 106  (!) 105   Resp: 11  15   Temp:  97.7 F (36.5 C)  97.8 F (36.6 C)  TempSrc:  Oral  Oral  SpO2: 100%  100%   Weight:      Height:       Eyes: PERRL, lids and conjunctivae normal  ENMT: Mucous membranes are moist. Posterior pharynx clear of any exudate or lesions.Normal dentition.  Neck: normal, supple, no masses, no thyromegaly Respiratory:Crackles at bases. Normal respiratory effort. No accessory muscle use.  Cardiovascular: Regular rate and rhythm, no murmurs / rubs / gallops. 3+ extremity edema. 2+ pedal pulses. No carotid bruits.  Abdomen: no tenderness, no masses palpated. No hepatosplenomegaly. Bowel sounds positive.  Musculoskeletal: no clubbing / cyanosis. No joint deformity upper and lower extremities. Good ROM, no contractures. Normal muscle tone. Diffuse anasarca Skin: Venous stasis changes and bullous lesions noted in lower extremities bilaterally Neurologic: CN 2-12 grossly intact. Sensation intact, DTR normal. Strength 5/5 in all 4.  Psychiatric: Normal judgment and insight. Alert and oriented x 3. Normal mood.   Labs on Admission: I have personally reviewed following labs and imaging studies  CBC:  Recent Labs Lab 04/04/17 0814  WBC 7.6  NEUTROABS 6.2  HGB 7.8*  HCT 23.6*  MCV 93.7  PLT 95*   Basic Metabolic Panel:  Recent Labs Lab 04/04/17 0814  NA 142  K 4.9  CL 100*  CO2 13*  GLUCOSE 93  BUN 147*  CREATININE 20.66*  CALCIUM 5.4*   GFR: Estimated Creatinine Clearance: 3.6 mL/min (A) (by C-G formula based on SCr of 20.66 mg/dL (H)). Liver Function  Tests:  Recent Labs Lab 04/04/17 0814  AST 23  ALT 9*  ALKPHOS 96  BILITOT 1.4*  PROT 6.5  ALBUMIN 2.7*   No results for input(s): LIPASE, AMYLASE in the last 168 hours.  Recent Labs Lab 04/04/17 0834  AMMONIA 90*   Coagulation Profile: No results for input(s): INR, PROTIME in the last 168 hours. Cardiac Enzymes: No results for input(s): CKTOTAL, CKMB, CKMBINDEX, TROPONINI in the last 168 hours. BNP (last 3 results) No results for input(s): PROBNP in the last 8760 hours. HbA1C: No results for input(s): HGBA1C in the last 72 hours. CBG: No results for input(s): GLUCAP in the last 168 hours. Lipid Profile: No results for input(s): CHOL, HDL, LDLCALC, TRIG, CHOLHDL, LDLDIRECT in the last 72 hours. Thyroid Function Tests: No results for input(s): TSH, T4TOTAL, FREET4, T3FREE, THYROIDAB in the last 72 hours. Anemia Panel: No results for input(s): VITAMINB12, FOLATE, FERRITIN, TIBC, IRON, RETICCTPCT in the last 72 hours. Urine analysis:    Component Value Date/Time   COLORURINE YELLOW 01/12/2017 [place] 01/12/2017 1650   LABSPEC 1.013 01/12/2017 1650   PHURINE 6.0 01/12/2017 1650   GLUCOSEU NEGATIVE 01/12/2017 1650   HGBUR SMALL (A) 01/12/2017 1650   BILIRUBINUR NEGATIVE 01/12/2017 1650   KETONESUR NEGATIVE 01/12/2017 1650   PROTEINUR 30 (A) 01/12/2017 1650   UROBILINOGEN 0.2 08/13/2014 2138   NITRITE NEGATIVE 01/12/2017 1650   LEUKOCYTESUR NEGATIVE 01/12/2017 1650    Radiological Exams on Admission: Dg Pelvis Portable  Result Date: 04/04/2017 CLINICAL DATA:  Fall. EXAM: PORTABLE PELVIS 1-2 VIEWS COMPARISON:  CT from 11/23/2016 FINDINGS: Bone detail is limited due to extensive soft tissue. Pelvic bony ring appears to be grossly intact. Normal alignment of both hips. No gross hip fracture. IMPRESSION: Limited examination of the pelvis as described. No gross acute abnormality. Electronically Signed   By: Markus Daft M.D.   On: 04/04/2017 09:26   Dg  Chest Portable 1 View  Result Date: 04/04/2017 CLINICAL DATA:  Patient fell.  Pain. EXAM: PORTABLE CHEST 1 VIEW COMPARISON:  03/02/2017. FINDINGS: Mild cardiac enlargement. Mild vascular congestion. No overt failure or infiltrates. No visible rib fracture. No pneumothorax. Thoracic atherosclerosis. IMPRESSION: Mild cardiac  enlargement.  No active infiltrates or failure. Electronically Signed   By: Staci Righter M.D.   On: 04/04/2017 09:26    EKG: Independently reviewed. Sinus tachycardia, prolonged QT interval, no ischemic changes noted  Assessment/Plan Active Problems:   Anasarca   Thrombocytopenia (HCC)   Anemia of chronic disease   Liver cirrhosis secondary to NASH (nonalcoholic steatohepatitis) (HCC)   ESRD (end stage renal disease) ([place])   Hepatic encephalopathy ([place])   Uremia   Fall at home, initial encounter  1. End-stage renal disease on hemodialysis. Patient has not had dialysis in over one month. Nephrology consulted and started her on dialysis. 2. [name] cirrhosis. Patient has evidence of decompensated cirrhosis. Ammonia is also elevated. She reports taking lactulose intermittently. We'll need to continue with volume removal. 3. Hepatic encephalopathy. Ammonia elevated at 90. Continue on lactulose. 4. Anemia of chronic disease. Near her baseline which ranges between 8 and 9. No evidence of bleeding. Continue to monitor 5. Thrombocytopenia. Related cirrhosis. 6. Anasarca. Related to noncompliance with dialysis. Continue volume removal with dialysis. 7. Fall. Suspect this mechanical fall. She denies any syncope or head trauma. Continue to monitor. Will need physical therapy evaluation 8. Goals of care. When asked regarding CODE STATUS, patient is unsure whether she wishes to be full code and DO NOT RESUSCITATE. Will request palliative care to assist with these discussions.  DVT prophylaxis: SCDs Code Status: Full code, although patient is unsure and wishes to discuss this further  with her family Family Communication: No family present Disposition Plan: discharge home once improved Consults called: nephrology, palliative care Admission status: inpatient/stepdown   MEMON,JEHANZEB MD [facility] Pager [phone]  If 7PM-7AM, please contact night-coverage [email or web address] Password [facility]  04/04/2017, 5:37 PM

## 2017-04-04 NOTE — ED Provider Notes (Signed)
[facility] DEPT Provider Note   CSN: [id number] Arrival date & time: 04/04/17  8466     History   Chief Complaint Chief Complaint  Patient presents with  . Fall    HPI Wanda [name] is a 41 y.o. female.  Patient fell at home and was unable to get up. She has end-stage renal disease but has not gone to dialysis in one month.she also has liver failure.   The history is provided by the patient. No language interpreter was used.  Fall  This is a new problem. The current episode started 6 to 12 hours ago. The problem occurs constantly. The problem has been resolved. Pertinent negatives include no chest pain, no abdominal pain and no headaches. Nothing aggravates the symptoms. Nothing relieves the symptoms. She has tried nothing for the symptoms. The treatment provided no relief.    Past Medical History:  Diagnosis Date  . Acute blood loss anemia 02/25/2014   Status post transfusion  . Acute renal failure ([place]) 09/2013   Pre-renal- resolved  . Anasarca 10/10/2013  . Anxiety   . Bipolar disorder ([place]) 12/04/2013   2007-SEEN IN ED FOR INVOLUNTARY COMMITMENT, UDS POS FOR AMPHETAMINES/OPIATES   . Bleeding esophageal varices ([place]) 02/28/2014   s/p banding  . C. difficile colitis 04/19/2014  . Chronic hypotension   . Cirrhosis ([place]) 10/05/13   Liver bx 11/23/13 (delayed initially due to patient refusal). c/w steatohepatitis  . Cirrhosis of liver with ascites ([place])   . Depression   . ESRD (end stage renal disease) on dialysis ([place]) 08/2014  . Folate deficiency 09/2013  . Gastroesophageal junction ulcer 09/17/2014  . GERD (gastroesophageal reflux disease)   . Hematemesis/vomiting blood 02/24/2014  . Macrocytosis 02/28/2014  . PNA (pneumonia) 10/13/2013  . SBP (spontaneous bacterial peritonitis) ([place]) 11/10/2013  . Thrombocytopenia ([place])    Hypercellular bone marrow; abundant megakaryocytes per 08/27/2014; s/p bone marrow bx    Patient Active Problem List   Diagnosis Date Noted  .  Encounter for orogastric (OG) tube placement   . Hepatic encephalopathy ([place])   . Uremia   . Altered mental status 01/12/2017  . Cellulitis 01/12/2017  . Bullous skin disease 12/22/2016  . ESRD (end stage renal disease) ([place]) 12/13/2016  . Volume overload 12/13/2016  . History of noncompliance with medical treatment   . Liver cirrhosis secondary to NASH (nonalcoholic steatohepatitis) ([place])   . Palliative care encounter   . Pulmonary edema 11/20/2016  . Hypocalcemia 11/20/2016  . Cardiomyopathy- ? ischemic  10/09/2016  . Troponin level elevated 10/09/2016  . Goals of care, counseling/discussion   . Palliative care by specialist   . Seizure ([place]) 10/07/2016  . Pressure injury of skin 05/20/2016  . Shock ([place]) 05/19/2016  . Rhonchi   . Chronic pain syndrome 08/22/2015  . Abdominal pain 08/07/2015  . Umbilical hernia without obstruction and without gangrene 08/07/2015  . GI bleed   . Acute blood loss anemia   . Hypotension 06/05/2015  . Edema of left lower extremity 02/22/2015  . Ascites   . History of esophageal varices with bleeding 09/16/2014  . End stage renal disease on dialysis ([place])   . Anemia of chronic disease   . Thrombocytopenia ([place]) 08/19/2014  . Hyponatremia 08/17/2014  . Esophageal varices without bleeding ([place])   . Portal hypertensive gastropathy ([place])   . Malnutrition of moderate degree ([place]) 05/15/2014  . Bipolar disorder ([place]) 12/04/2013  . SBP (spontaneous bacterial peritonitis) ([place]) 11/10/2013  . 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 Folate deficiency 10/13/2013  . Anasarca 10/10/2013  . Anemia 10/06/2013    Past Surgical History:  Procedure Laterality Date  . AV FISTULA PLACEMENT Right 11/16/2014   Procedure: Right arm Creation of arteriovenous fistula;  Surgeon: Angelia Mould, MD;  Location: [facility];  Service: Vascular;  Laterality: Right;  . CENTRAL VENOUS CATHETER INSERTION Right   . COLONOSCOPY  N/A 12/19/2013   SLF:NO OBVIOUS SOURCE FOR ANEMIA IDETIFIED/ONE COLON POLYP REMOVED/Small internal hemorrhoids  . ESOPHAGEAL BANDING  07/04/2014   Procedure: ESOPHAGEAL BANDING;  Surgeon: Daneil Dolin, MD;  [facility];  Service: Endoscopy;;  . ESOPHAGEAL BANDING N/A 07/24/2014   Procedure: ESOPHAGEAL BANDING (2 bands applied);  Surgeon: Danie Binder, MD;  Location: [facility];  Service: Endoscopy;  Laterality: N/A;  . ESOPHAGEAL BANDING N/A 07/23/2015   Procedure: ESOPHAGEAL BANDING;  Surgeon: Danie Binder, MD;  [facility];  Service: Endoscopy;  Laterality: N/A;  . ESOPHAGEAL BANDING N/A 03/04/2016   Procedure: ESOPHAGEAL BANDING;  Surgeon: Danie Binder, MD;  [facility];  Service: Endoscopy;  Laterality: N/A;  . ESOPHAGEAL BANDING N/A 04/30/2016   Procedure: ESOPHAGEAL BANDING;  Surgeon: Daneil Dolin, MD;  [facility];  Service: Endoscopy;  Laterality: N/A;  . ESOPHAGEAL BANDING N/A 01/13/2017   Procedure: ESOPHAGEAL BANDING;  Surgeon: Danie Binder, MD;  [facility];  Service: Endoscopy;  Laterality: N/A;  . ESOPHAGOGASTRODUODENOSCOPY N/A 11/14/2013   SLF:1 column of very small varices in distal esopahgus/MODERATE PORTAL GASTROPATHY IN PROXIMAL STOMACH/MODERATE erosive gastritis  . ESOPHAGOGASTRODUODENOSCOPY N/A 02/11/2014   Dr. Rourk:Esophageal varices with bleeding stigmata-status post esophageal band ligation therapy. Portal gastropathy  . ESOPHAGOGASTRODUODENOSCOPY N/A 07/04/2014   RMR: Persiting grade 2 esophageal varicies with bleeding stigmata status post band ligation. Significantly congested gastric mucosa iwith changes constistant with protal gastropathy.   . ESOPHAGOGASTRODUODENOSCOPY N/A 09/16/2014   Rehman: Single short column of varix proximal to GE junction not large enough to be banded. Two amall ulcers at the GEJ felt to be source of GI Bleeding but no active bleeding but no actibe bleeding noted. No therapy rendered.  Portal gastropathy NO evidence of peptic ulcer diease or gastric varices.   . ESOPHAGOGASTRODUODENOSCOPY N/A 12/14/2014   SLF: Grade ! esophageal varices. 2. Moderate Portal Gastropathy  . ESOPHAGOGASTRODUODENOSCOPY N/A 03/27/2015   Procedure: ESOPHAGOGASTRODUODENOSCOPY (EGD);  Surgeon: Danie Binder, MD;  [facility];  Service: Endoscopy;  Laterality: N/A;  1045am - moved to 817 @ 11:30  . ESOPHAGOGASTRODUODENOSCOPY N/A 06/05/2015   Procedure: ESOPHAGOGASTRODUODENOSCOPY (EGD);  Surgeon: Clarene Essex, MD;  Location[facility];  Service: Endoscopy;  Laterality: N/A;  . ESOPHAGOGASTRODUODENOSCOPY N/A 07/23/2015   SLF:1. Grade 1 esophageal varices 2. Moderate portal hypertensive gastropathy 3. MILd non-erosive gastritis.   [name] ESOPHAGOGASTRODUODENOSCOPY N/A 03/04/2016   Dr. Oneida Alar: grade 1 varices, surveillance in Jan 2017   . ESOPHAGOGASTRODUODENOSCOPY N/A 05/21/2016   Dr. Carlean Purl: non-bleeding esophageal ulcer, likely secondary to MW tear  . ESOPHAGOGASTRODUODENOSCOPY N/A 01/13/2017   Procedure: ESOPHAGOGASTRODUODENOSCOPY (EGD);  Surgeon: Danie Binder, MD;  [facility];  Service: Endoscopy;  Laterality: N/A;  . ESOPHAGOGASTRODUODENOSCOPY (EGD) WITH PROPOFOL N/A 07/24/2014   SLF:  1. 2 columns grade 2-3 varices- 2 Bands applied.  2.  Moderate gastropathy 3. Duodenal Diverticula  . ESOPHAGOGASTRODUODENOSCOPY (EGD) WITH PROPOFOL N/A 10/26/2014   RMR: 2 columns of grade 2 esophageal varices without obvious bleeding stigmata status post  band ligation to complet obliteration of remaining varices.   . ESOPHAGOGASTRODUODENOSCOPY (EGD) WITH PROPOFOL N/A 04/30/2016   Dr. Gala Romney: MW tear, varices obliterated, portal gastropathy  . None    . PARACENTESIS  Feb 2015   1180 fluid, negative fluid analysis.   [name] PARACENTESIS  10/2013    OB History    Gravida Para Term Preterm AB Living   _0 SAB TAB Ectopic Multiple Live Births                   Home Medications    Prior  to Admission medications   Medication Sig Start Date End Date Taking? Authorizing Provider  lidocaine-prilocaine (EMLA) cream Apply 1 application topically as needed (for dialysis treatments).   Yes [provider]  LORazepam (ATIVAN) 1 MG tablet Take 1/2 to 1 tablet by mouth twice daily as needed for back spasm or for anxiety. 03/24/17  Yes Mahala Menghini, PA-C  Oxycodone HCl 10 MG TABS Take 1 tablet (10 mg total) by mouth 3 (three) times daily as needed. FOR PAIN 03/24/17  Yes Mahala Menghini, PA-C  amoxicillin-clavulanate (AUGMENTIN) 500-125 MG tablet Take 1 tablet (500 mg total) by mouth daily. 01/19/17   Orvan Falconer, MD  augmented betamethasone dipropionate (DIPROLENE-AF) 0.05 % cream APPLY TO THE AFFECTED AREAS ON LEGS TWICE DAILY AS NEEDED (NOT FACE, GROIN, OR UNDER ARMS) 12/24/16   [provider]  chlorhexidine gluconate, MEDLINE KIT, (PERIDEX) 0.12 % solution 15 mLs by Mouth Rinse route 2 (two) times daily. 01/19/17   Orvan Falconer, MD  clotrimazole (LOTRIMIN) 1 % cream Apply 1 application topically 3 (three) times daily. 12/25/16   Annitta Needs, NP  Darbepoetin Alfa (ARANESP) 60 MCG/0.3ML SOSY injection Inject 0.3 mLs (60 mcg total) into the vein every Thursday with hemodialysis. Patient taking differently: Inject 60 mcg into the vein every Tuesday with hemodialysis.  06/09/15   Cherene Altes, MD  lactulose (CHRONULAC) 10 GM/15ML solution Take 15-30 mLs (10-20 g total) by mouth 3 (three) times daily. TAKE 15-30 ml BY MOUTH three times a day 12/21/16   Samuella Cota, MD  lidocaine (XYLOCAINE) 2 % solution TAKE TWO TEASPOONSFUL (10ML) BY MOUTH BEFORE MEALS AND AT BEDTIME TO PREVENT CHEST PAIN WHILE EATING 03/03/17   Annitta Needs, NP  midodrine (PROAMATINE) 5 MG tablet TAKE TWO (2) TABLETS BY MOUTH DAILY. TAKE ONLY ON DIALYSIS DAYS ON TUESDAYS, THURDAYS, AND SATURDAYS (SOMETIME TAKES TREATMENTS ON FRIDAY IN 12/29/16   [provider]  omeprazole (PRILOSEC) 20 MG capsule  1 po every morning 30 minutes prior to your first meal. Patient taking differently: Take 20 mg by mouth daily before breakfast. 1 po every morning 30 minutes prior to your first meal. 09/30/16   Fields, Marga Melnick, MD  ondansetron (ZOFRAN) 4 MG tablet Take 1 tablet (4 mg total) by mouth every 8 (eight) hours as needed for nausea or vomiting. 03/31/17   Carlis Stable, NP  pantoprazole (PROTONIX) 40 MG tablet Take 1 tablet (40 mg total) by mouth daily at 6 (six) AM. 01/20/17   Orvan Falconer, MD  RENVELA 800 MG tablet Take 1600 mg by mouth 3 times daily with meals. Take 800 mg by mouth with snacks. 10/29/15   [provider]  sucralfate (CARAFATE) 1 GM/10ML suspension Take 1 g by mouth 4 (four) times daily -  with meals and at bedtime.    [provider]  Family History Family History  Problem Relation Age of Onset  . Heart disease Mother   . Colon cancer Neg Hx   . Liver disease Neg Hx     Social History Social History  Substance Use Topics  . Smoking status: Former Smoker    Packs/day: 0.25    Years: 20.00    Types: Cigarettes    Quit date: 11/05/2008  . Smokeless tobacco: Never Used     Comment: Never really smoked much  . Alcohol use No     Allergies   Prednisone; Iohexol; Lasix [furosemide]; and Latex   Review of Systems Review of Systems  Constitutional: Positive for fatigue. Negative for appetite change.  HENT: Negative for congestion, ear discharge and sinus pressure.   Eyes: Negative for discharge.  Respiratory: Negative for cough.   Cardiovascular: Negative for chest pain.  Gastrointestinal: Negative for abdominal pain and diarrhea.  Genitourinary: Negative for frequency and hematuria.  Musculoskeletal: Negative for back pain.       Skin fair edema in her legs  Skin: Negative for rash.  Neurological: Negative for seizures and headaches.  Psychiatric/Behavioral: Negative for hallucinations.     Physical Exam Updated Vital Signs BP 123/61 (BP Location:  Left Arm)   Pulse (!) 101   Temp 97.7 F (36.5 C) (Oral)   Resp (!) 21   Ht 5' (1.524 m)   Wt 90.7 kg (200 lb)   LMP 09/21/2013   SpO2 97%   BMI 39.06 kg/m   Physical Exam  Constitutional: She is oriented to person, place, and time. No distress.  HENT:  Head: Normocephalic.  Eyes: Conjunctivae and EOM are normal. No scleral icterus.  Neck: Neck supple. No thyromegaly present.  Cardiovascular: Normal rate and regular rhythm.  Exam reveals no gallop and no friction rub.   No murmur heard. Pulmonary/Chest: No stridor. She has no wheezes. She has no rales. She exhibits no tenderness.  Abdominal: She exhibits no distension. There is no tenderness. There is no rebound.  Musculoskeletal: Normal range of motion. She exhibits edema.  Patient has 3+ edema in both lower legs all the way up to her groin  Lymphadenopathy:    She has no cervical adenopathy.  Neurological: She is oriented to person, place, and time. She exhibits normal muscle tone. Coordination normal.  Skin: No rash noted. No erythema.  Psychiatric: Her behavior is normal.     ED Treatments / Results  Labs (all labs ordered are listed, but only abnormal results are displayed) Labs Reviewed  CBC WITH DIFFERENTIAL/PLATELET - Abnormal; Notable for the following:       Result Value   RBC 2.52 (*)    Hemoglobin 7.8 (*)    HCT 23.6 (*)    RDW 16.3 (*)    Platelets 95 (*)    All other components within normal limits  COMPREHENSIVE METABOLIC PANEL - Abnormal; Notable for the following:    Chloride 100 (*)    CO2 13 (*)    BUN 147 (*)    Creatinine, Ser 20.66 (*)    Calcium 5.4 (*)    Albumin 2.7 (*)    ALT 9 (*)    Total Bilirubin 1.4 (*)    GFR calc non Af Amer 2 (*)    GFR calc Af Amer 2 (*)    Anion gap 29 (*)    All other components within normal limits  AMMONIA - Abnormal; Notable for the following:    Ammonia 90 (*)  All other components within normal limits    EKG  EKG Interpretation None        Radiology Dg Pelvis Portable  Result Date: 04/04/2017 CLINICAL DATA:  Fall. EXAM: PORTABLE PELVIS 1-2 VIEWS COMPARISON:  CT from 11/23/2016 FINDINGS: Bone detail is limited due to extensive soft tissue. Pelvic bony ring appears to be grossly intact. Normal alignment of both hips. No gross hip fracture. IMPRESSION: Limited examination of the pelvis as described. No gross acute abnormality. Electronically Signed   By: Markus Daft M.D.   On: 04/04/2017 09:26   Dg Chest Portable 1 View  Result Date: 04/04/2017 CLINICAL DATA:  Patient fell.  Pain. EXAM: PORTABLE CHEST 1 VIEW COMPARISON:  03/02/2017. FINDINGS: Mild cardiac enlargement. Mild vascular congestion. No overt failure or infiltrates. No visible rib fracture. No pneumothorax. Thoracic atherosclerosis. IMPRESSION: Mild cardiac enlargement.  No active infiltrates or failure. Electronically Signed   By: Staci Righter M.D.   On: 04/04/2017 09:26    Procedures Procedures (including critical care time)  Medications Ordered in ED Medications  HYDROcodone-acetaminophen (NORCO/VICODIN) 5-325 MG per tablet 1 tablet (1 tablet Oral Given 04/04/17 0914)     Initial Impression / Assessment and Plan / ED Course  I have reviewed the triage vital signs and the nursing notes.  Pertinent labs & imaging results that were available during my care of the patient were reviewed by me and considered in my medical decision making (see chart for details). CRITICAL CARE Performed by: Kazuto Sevey L Total critical care time: 35 minutes Critical care time was exclusive of separately billable procedures and treating other patients. Critical care was necessary to treat or prevent imminent or life-threatening deterioration. Critical care was time spent personally by me on the following activities: development of treatment plan with patient and/or surrogate as well as nursing, discussions with consultants, evaluation of patient's response to treatment, examination  of patient, obtaining history from patient or surrogate, ordering and performing treatments and interventions, ordering and review of laboratory studies, ordering and review of radiographic studies, pulse oximetry and re-evaluation of patient's condition.     Patient with renal failure. And hepatic failure. She will be admitted for dialysis  Final Clinical Impressions(s) / ED Diagnoses   Final diagnoses:  ESRD (end stage renal disease) ([place])    New Prescriptions New Prescriptions   No medications on file     Milton Ferguson, MD 04/04/17 [phone]

## 2017-04-04 NOTE — Consult Note (Addendum)
Wanda [name] MRN: [id number] DOB/AGE: 02/14/1976 41 y.o. Primary Care Physician:Roberson, [name], MD Admit date: 04/04/2017 Chief Complaint:  Chief Complaint  Patient presents with  . Fall   HPI: Pt is 41 year old caucasian female with past medical hx of ESRD who was brought to ER with c/o  Fall.  HPI dates back to yesterday when pt fell last night around 7pm, It took several hours for husband to get her off the floor. Patient refuse to come to hospital last night, brought this am for hurting all over. Pt was brought to ER.Upon evaluation in ER , pt had elevated BUN/creatinine as  Patient is dialysis patient has not had dialysis in over a month.  Pt offers no other specific complaints. Pt says " I am hurting all over"  Pt offers No chest pain. No  C/o  abdominal pain. NO c/o  headaches     Past Medical History:  Diagnosis Date  . Acute blood loss anemia 02/25/2014   Status post transfusion  . Acute renal failure ([place]) 09/2013   Pre-renal- resolved  . Anasarca 10/10/2013  . Anxiety   . Bipolar disorder ([place]) 12/04/2013   2007-SEEN IN ED FOR INVOLUNTARY COMMITMENT, UDS POS FOR AMPHETAMINES/OPIATES   . Bleeding esophageal varices ([place]) 02/28/2014   s/p banding  . C. difficile colitis 04/19/2014  . Chronic hypotension   . Cirrhosis ([place]) 10/05/13   Liver bx 11/23/13 (delayed initially due to patient refusal). c/w steatohepatitis  . Cirrhosis of liver with ascites ([place])   . Depression   . ESRD (end stage renal disease) on dialysis ([place]) 08/2014  . Folate deficiency 09/2013  . Gastroesophageal junction ulcer 09/17/2014  . GERD (gastroesophageal reflux disease)   . Hematemesis/vomiting blood 02/24/2014  . Macrocytosis 02/28/2014  . PNA (pneumonia) 10/13/2013  . SBP (spontaneous bacterial peritonitis) ([place]) 11/10/2013  . Thrombocytopenia ([place])    Hypercellular bone marrow; abundant megakaryocytes per 08/27/2014; s/p bone marrow bx        Family History  Problem Relation Age of Onset   . Heart disease Mother   . Colon cancer Neg Hx   . Liver disease Neg Hx     Social History:  reports that she quit smoking about 8 years ago. Her smoking use included Cigarettes. She has a 5.00 pack-year smoking history. She has never used smokeless tobacco. She reports that she does not drink alcohol or use drugs.   Allergies:  Allergies  Allergen Reactions  . Prednisone Swelling  . Iohexol Rash    Pt states rash after last CT. Pt needs pre-meds  . Lasix [Furosemide] Other (See Comments)    "doesn't work"  . Latex Itching    Medications Prior to Admission  Medication Sig Dispense Refill  . lidocaine-prilocaine (EMLA) cream Apply 1 application topically as needed (for dialysis treatments).    . LORazepam (ATIVAN) 1 MG tablet Take 1/2 to 1 tablet by mouth twice daily as needed for back spasm or for anxiety. 30 tablet 3  . Oxycodone HCl 10 MG TABS Take 1 tablet (10 mg total) by mouth 3 (three) times daily as needed. FOR PAIN 90 tablet 0  . amoxicillin-clavulanate (AUGMENTIN) 500-125 MG tablet Take 1 tablet (500 mg total) by mouth daily. 7 tablet 0  . augmented betamethasone dipropionate (DIPROLENE-AF) 0.05 % cream APPLY TO THE AFFECTED AREAS ON LEGS TWICE DAILY AS NEEDED (NOT FACE, GROIN, OR UNDER ARMS)  3  . chlorhexidine gluconate, MEDLINE KIT, (PERIDEX) 0.12 % solution 15 mLs by Mouth  Rinse route 2 (two) times daily. 120 mL 0  . clotrimazole (LOTRIMIN) 1 % cream Apply 1 application topically 3 (three) times daily. 12 g 1  . Darbepoetin Alfa (ARANESP) 60 MCG/0.3ML SOSY injection Inject 0.3 mLs (60 mcg total) into the vein every Thursday with hemodialysis. (Patient taking differently: Inject 60 mcg into the vein every Tuesday with hemodialysis. ) 4.2 mL   . lactulose (CHRONULAC) 10 GM/15ML solution Take 15-30 mLs (10-20 g total) by mouth 3 (three) times daily. TAKE 15-30 ml BY MOUTH three times a day    . lidocaine (XYLOCAINE) 2 % solution TAKE TWO TEASPOONSFUL (10ML) BY MOUTH BEFORE  MEALS AND AT BEDTIME TO PREVENT CHEST PAIN WHILE EATING 500 mL 1  . midodrine (PROAMATINE) 5 MG tablet TAKE TWO (2) TABLETS BY MOUTH DAILY. TAKE ONLY ON DIALYSIS DAYS ON TUESDAYS, THURDAYS, AND SATURDAYS (SOMETIME TAKES TREATMENTS ON FRIDAY IN  3  . omeprazole (PRILOSEC) 20 MG capsule 1 po every morning 30 minutes prior to your first meal. (Patient taking differently: Take 20 mg by mouth daily before breakfast. 1 po every morning 30 minutes prior to your first meal.) 31 capsule 11  . ondansetron (ZOFRAN) 4 MG tablet Take 1 tablet (4 mg total) by mouth every 8 (eight) hours as needed for nausea or vomiting. 30 tablet 1  . pantoprazole (PROTONIX) 40 MG tablet Take 1 tablet (40 mg total) by mouth daily at 6 (six) AM. 30 tablet 1  . RENVELA 800 MG tablet Take 1600 mg by mouth 3 times daily with meals. Take 800 mg by mouth with snacks.  5  . sucralfate (CARAFATE) 1 GM/10ML suspension Take 1 g by mouth 4 (four) times daily -  with meals and at bedtime.         ROS:Pt offers no specific complaints     Physical Exam: Vital signs in last 24 hours: Temp:  [97.7 F (36.5 C)] 97.7 F (36.5 C) (08/26 1400) Pulse Rate:  [95-104] 96 (08/26 1500) Resp:  [12-22] 15 (08/26 1500) BP: (109-126)/(52-77) 111/52 (08/26 1500) SpO2:  [97 %-100 %] 100 % (08/26 1500) Weight:  [199 lb 15.3 oz (90.7 kg)-200 lb (90.7 kg)] 199 lb 15.3 oz (90.7 kg) (08/26 1400) Weight change:  Last BM Date: 04/03/17  Intake/Output from previous day: No intake/output data recorded. Total I/O In: 236 [P.O.:236] Out: -    Physical Exam: General- pt is awake, alert,follows commands. Resp- no acute resp distress, decreased at bases. CVS- S1S2 regular in rate and rhythm GIT- BS+, soft, umbilical hernia present, distended EXT- 3+ LE Edema,NO Cyanosis. Chronic venous stasis changes present. Access-  AVF+   Lab Results: CBC  Recent Labs  04/04/17 0814  WBC 7.6  HGB 7.8*  HCT 23.6*  PLT 95*    BMET  Recent Labs   04/04/17 0814  NA 142  K 4.9  CL 100*  CO2 13*  GLUCOSE 93  BUN 147*  CREATININE 20.66*  CALCIUM 5.4*    MICRO No results found for this or any previous visit (from the past 240 hour(s)).    Lab Results  Component Value Date   CALCIUM 5.4 (LL) 04/04/2017   CAION 0.86 (LL) 01/12/2017   PHOS 7.8 (H) 01/19/2017      Impression: 1)RenalESRD  HD started on  08/18/14  Pt is on Tue/Thurs/Saturday schedule   Pt has have hx of non adherence to dialysis                Pt missed  last month of HD   2)CVS- Hemodynamically fragile  BP low  On Midodrine   3)Anemia IN ESRD the goal for HGb is  9--11 Pt hgb is not at  the goal  Will keep on  epo    4)Liver- Cirrhosis Sec to Steato hepatitis  Primary team following  5)Electrolytes  Normokalemia  Normonatremia    6)IHypocalcemia- sec to non adherence to dialysis tx    7)Acid base Co2 not   at goal NON AG acidosis sec to non adherence o dialysis tx   8) Thrombocytopenia-Stable Primary MD following    Plan:  Will dialzye today Will use 2k bath Will use 3Ca bath o help with hypocalcemia Will keep on epo May need albumin for hypotension during hd.   Addendum Pt seen on dialysis Pt tolerating tx well. Will dialyze again in am    Lus Kriegel S 04/04/2017, 3:44 PM

## 2017-04-04 NOTE — ED Triage Notes (Signed)
Patient fell last night around 7pm, took several hours for husband to get her off the floor. Patient is dialysis patient has not had dialysis in over a month.  Patient refuse to come to hospital last night, brought this am for hurting all over.

## 2017-04-05 ENCOUNTER — Encounter (HOSPITAL_COMMUNITY): Payer: Self-pay | Admitting: Primary Care

## 2017-04-05 DIAGNOSIS — Z515 Encounter for palliative care: Secondary | ICD-10-CM

## 2017-04-05 LAB — COMPREHENSIVE METABOLIC PANEL
ALBUMIN: 2.5 g/dL — AB (ref 3.5–5.0)
ALK PHOS: 90 U/L (ref 38–126)
ALT: 9 U/L — ABNORMAL LOW (ref 14–54)
ANION GAP: 23 — AB (ref 5–15)
AST: 25 U/L (ref 15–41)
BUN: 110 mg/dL — ABNORMAL HIGH (ref 6–20)
CALCIUM: 5.7 mg/dL — AB (ref 8.9–10.3)
CHLORIDE: 100 mmol/L — AB (ref 101–111)
CO2: 17 mmol/L — AB (ref 22–32)
Creatinine, Ser: 17.14 mg/dL — ABNORMAL HIGH (ref 0.44–1.00)
GFR calc Af Amer: 3 mL/min — ABNORMAL LOW (ref >60)
GFR calc non Af Amer: 2 mL/min — ABNORMAL LOW (ref >60)
GLUCOSE: 110 mg/dL — AB (ref 65–99)
POTASSIUM: 3.7 mmol/L (ref 3.5–5.1)
SODIUM: 140 mmol/L (ref 135–145)
Total Bilirubin: 1 mg/dL (ref 0.3–1.2)
Total Protein: 5.9 g/dL — ABNORMAL LOW (ref 6.5–8.1)

## 2017-04-05 LAB — CBC
HCT: 22 % — ABNORMAL LOW (ref 36.0–46.0)
HEMOGLOBIN: 7.1 g/dL — AB (ref 12.0–15.0)
MCH: 30.3 pg (ref 26.0–34.0)
MCHC: 32.3 g/dL (ref 30.0–36.0)
MCV: 94 fL (ref 78.0–100.0)
PLATELETS: 85 10*3/uL — AB (ref 150–400)
RBC: 2.34 MIL/uL — ABNORMAL LOW (ref 3.87–5.11)
RDW: 16.6 % — ABNORMAL HIGH (ref 11.5–15.5)
WBC: 6.9 10*3/uL (ref 4.0–10.5)

## 2017-04-05 LAB — PREPARE RBC (CROSSMATCH)

## 2017-04-05 IMAGING — US US PARACENTESIS
1 series · 4 of 4 positions shown · non-contrast
Comparison: none

INDICATION: Cirrhosis, ascites

[Series 1: us paracentesis · 0.25mm/px · 4 of 4 slices shown]
[im 1/4]
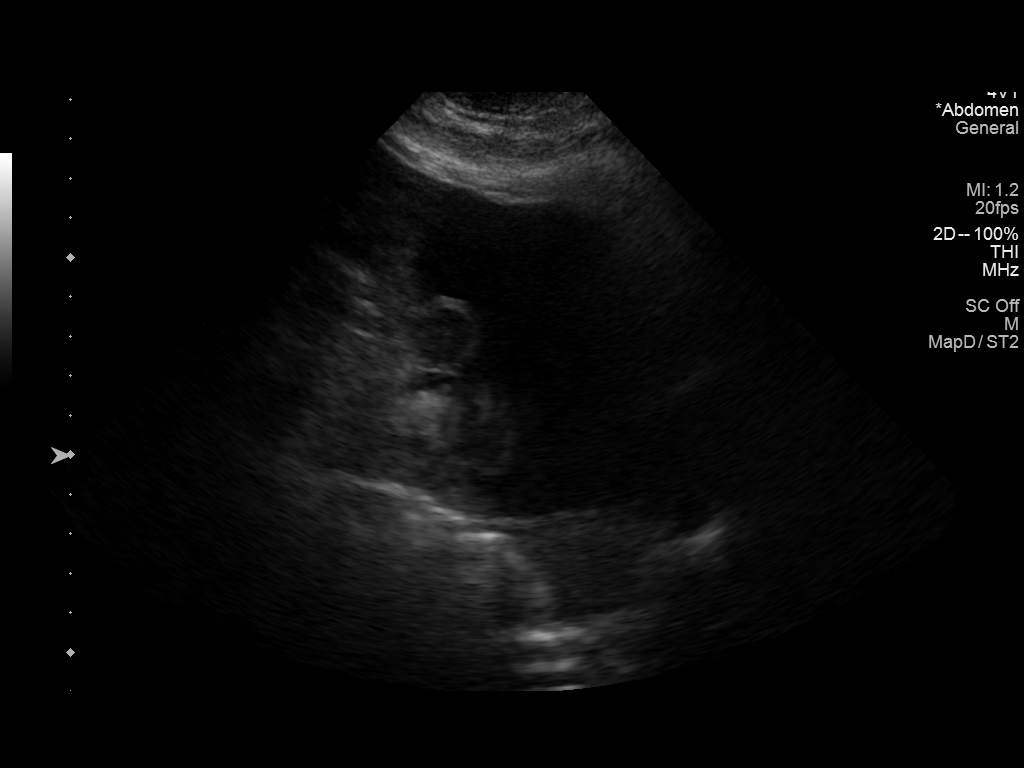
[im 2/4]
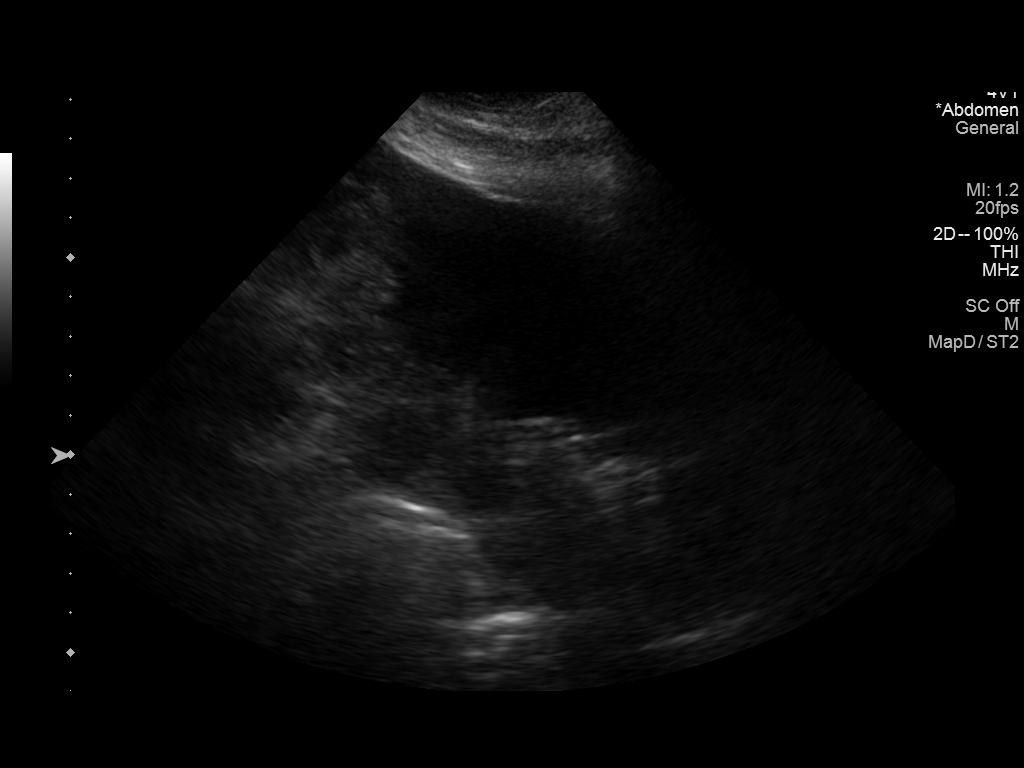
[im 3/4]
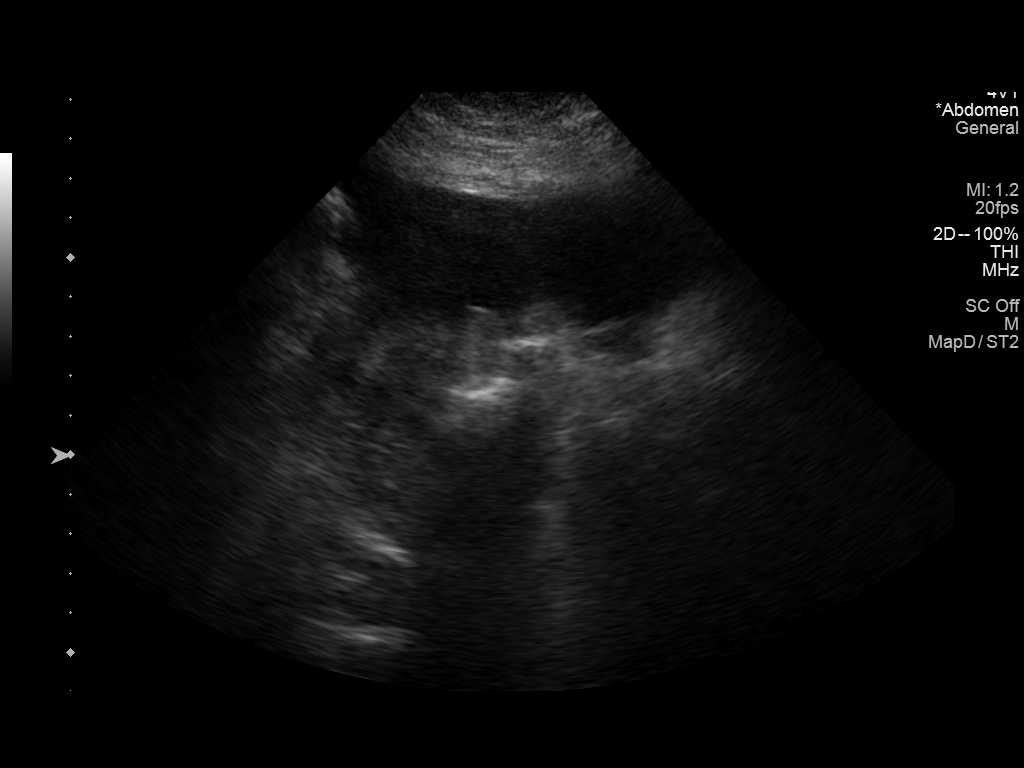
[im 4/4]
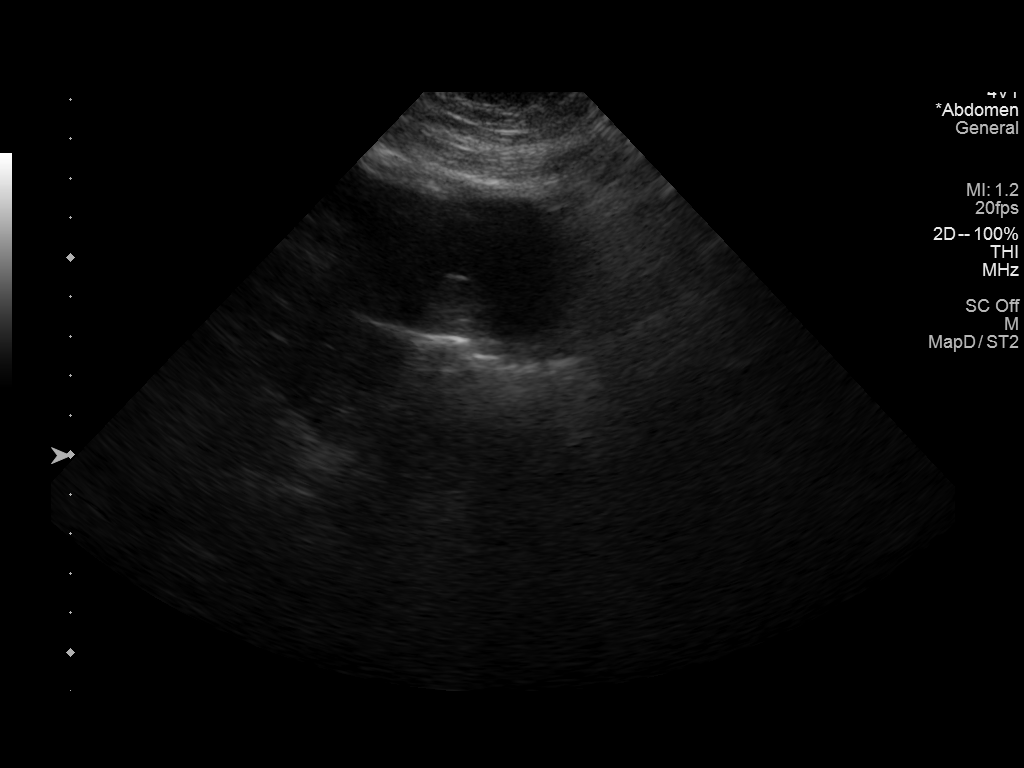

[4 of 4 positions shown; findings below may reference images not displayed]

EXAM:
ULTRASOUND GUIDED DIAGNOSTIC AND THERAPEUTIC PARACENTESIS

MEDICATIONS:
None.

COMPLICATIONS:
None immediate.

PROCEDURE:
Procedure, benefits, and risks of procedure were discussed with
patient.

Written informed consent for procedure was obtained.

Time out protocol followed.

Adequate collection of ascites localized by ultrasound in RIGHT
lower quadrant.

Skin prepped and draped in usual sterile fashion.

Skin and soft tissues anesthetized with 10 mL of 1% lidocaine.

5 French Yueh catheter placed into peritoneal cavity.

2.2 L of amber colored fluid aspirated by vacuum bottle suction.

Procedure tolerated well by patient without immediate complication.
FINDINGS: A total of approximately 2.2 L of ascitic fluid was removed.

Samples were sent to the laboratory as requested by the clinical
team.
IMPRESSION: Successful ultrasound-guided paracentesis yielding 2.2 liters of
peritoneal fluid.

## 2017-04-05 MED ORDER — SODIUM CHLORIDE 0.9 % IV SOLN
Freq: Once | INTRAVENOUS | Status: AC
  Administered 2017-04-05: 11:00:00 via INTRAVENOUS

## 2017-04-05 MED ORDER — OXYCODONE HCL 5 MG PO TABS: 10.0000 mg | ORAL_TABLET | Freq: Three times a day (TID) | ORAL | Status: DC | PRN

## 2017-04-05 MED ORDER — SODIUM CHLORIDE 0.9 % IV SOLN
1.0000 g | Freq: Once | INTRAVENOUS | Status: AC
  Administered 2017-04-05: 1 g via INTRAVENOUS
  Filled 2017-04-05: qty 10

## 2017-04-05 NOTE — Progress Notes (Signed)
Subjective: Interval History: has complaints of weakness, poor appetite, some nausea. Her breathing is okay.  Objective: Vital signs in last 24 hours: Temp:  [97.7 F (36.5 C)-98.5 F (36.9 C)] 97.9 F (36.6 C) (08/27 0826) Pulse Rate:  [79-106] 82 (08/27 0800) Resp:  [5-25] 13 (08/27 0800) BP: (89-123)/(48-77) 96/59 (08/27 0800) SpO2:  [97 %-100 %] 99 % (08/27 0800) Weight:  [84.1 kg (185 lb 6.5 oz)-90.7 kg (199 lb 15.3 oz)] 84.1 kg (185 lb 6.5 oz) (08/27 0500) Weight change: -0.019 kg (-0.7 oz)  Intake/Output from previous day: 08/26 0701 - 08/27 0700 In: 236 [P.O.:236] Out: 1000  Intake/Output this shift: No intake/output data recorded.  Generally patient is sleepy but arousable. Answers questions. Chest: Decreased breath sound bilaterally Heart exam revealed regular rate and rhythm Abdomen: Distended, nontender Extremities: She has 3+ edema  Lab Results:  Recent Labs  04/04/17 0814 04/05/17 0423  WBC 7.6 6.9  HGB 7.8* 7.1*  HCT 23.6* 22.0*  PLT 95* 85*   BMET:  Recent Labs  04/04/17 0814 04/05/17 0423  NA 142 140  K 4.9 3.7  CL 100* 100*  CO2 13* 17*  GLUCOSE 93 110*  BUN 147* 110*  CREATININE 20.66* 17.14*  CALCIUM 5.4* 5.7*   No results for input(s): PTH in the last 72 hours. Iron Studies: No results for input(s): IRON, TIBC, TRANSFERRIN, FERRITIN in the last 72 hours.  Studies/Results: Dg Pelvis Portable  Result Date: 04/04/2017 CLINICAL DATA:  Fall. EXAM: PORTABLE PELVIS 1-2 VIEWS COMPARISON:  CT from 11/23/2016 FINDINGS: Bone detail is limited due to extensive soft tissue. Pelvic bony ring appears to be grossly intact. Normal alignment of both hips. No gross hip fracture. IMPRESSION: Limited examination of the pelvis as described. No gross acute abnormality. Electronically Signed   By: Markus Daft M.D.   On: 04/04/2017 09:26   Dg Chest Portable 1 View  Result Date: 04/04/2017 CLINICAL DATA:  Patient fell.  Pain. EXAM: PORTABLE CHEST 1 VIEW  COMPARISON:  03/02/2017. FINDINGS: Mild cardiac enlargement. Mild vascular congestion. No overt failure or infiltrates. No visible rib fracture. No pneumothorax. Thoracic atherosclerosis. IMPRESSION: Mild cardiac enlargement.  No active infiltrates or failure. Electronically Signed   By: Staci Righter M.D.   On: 04/04/2017 09:26    I have reviewed the patient's current medications.  Assessment/Plan: Problem #1 end-stage renal disease: Patient noncompliant with dialysis. She missed her dialysis nearly for about 2 months. She came with high BUN and creatinine. Patient had a short dialysis yesterday. Still she has poor appetite and some nausea. Problem #2 anasarca: Patient with liver cirrhosis. Uncontrolled salt and fluid intake and lack of dialysis and fluid removal possibly contributed to her anasarca Problem #3 anemia: Her hemoglobin is significantly below her target goal. Most likely from lack of  and Epogen Problem #4 noncompliance with her dialysis. This is a recurrent problem. The last couple of months she has stopped coming to the dialysis unit. She goes to different hospitals to get her dialysis when she becomes sick. Problem #5 bone and mental disorder: Her calcium is low but phosphorus is not available Problem #6 history of depression Problem #7 hypotension Plan: 1] We'll dialyze patient today and remove some fluid if her blood pressure tolerates. 2] will check a renal panel in the morning 3] will continue to encourage patient to come to dialysis for her regular treatment.   LOS: 1 day   Jaleel Allen S 04/05/2017,8:27 AM

## 2017-04-05 NOTE — Consult Note (Signed)
Consultation Note Date: 04/05/2017   Patient Name: Wanda [name]  DOB: Mar 16, 1976  MRN: [id number]  Age / Sex: 41 y.o., female  PCP: [name], MD Referring Physician: Kathie Dike, MD  Reason for Consultation: Establishing goals of care and Psychosocial/spiritual support  HPI/Patient Profile: 41 y.o. female  with past medical history of Acute renal failure with hemodialysis, approximately one month since last dialysis treatment, cirrhosis of liver with ascites, history of SBP, C diff colitis, bleeding esophageal varices, anasarca, history of pneumonia, anxiety and depression admitted on 04/04/2017 with fluid overload, end stage renal disease, need for dialysis.   Clinical Assessment and Goals of Care: Ms. Barfuss is lying quietly in bed. Nursing staff and lab are in the room at this time. Ms. Winslett will briefly make but not keep eye contact. We talk briefly about her health, and I share that I will return later. No family at bedside at this time.  I returned later in the morning. Ms. Gittens is having hemodialysis. She again will make but not keep eye contact. She seems as usual, very subdued. She also seems confused at this time. There is again, no family at bedside. We talk briefly about her health, she refers to pain at her sacrum and upper leg. We talk about the use of pain medicine, which will likely be dialyzed out. I encourage nursing to plan giving her narcotic after dialysis. At this time, Ms. Rovner is unable to tell us why she did not take dialysis for a month. She does endorse that she had a ride to the dialysis center. She is very weak and frail today, I share that I will return to speak with her tomorrow.  Healthcare power of attorney NEXT OF KIN- Son Elita Quick. Ms. Kowal has been offered several opportunities regarding completing advance directives/healthcare power of attorney,  but has chosen to not complete paperwork listing her significant other [name].   SUMMARY OF RECOMMENDATIONS   at this point, continue full scope treatment. Continue code status discussions.  Code Status/Advance Care Planning:  Full code  Symptom Management:   per hospitalist, no additional needs at this time.  Palliative Prophylaxis:   Bowel Regimen, Frequent Pain Assessment and Turn Reposition  Additional Recommendations (Limitations, Scope, Preferences):  Full Scope Treatment  Psycho-social/Spiritual:   Desire for further Chaplaincy support:yes  Additional Recommendations: Caregiving  Support/Resources  Prognosis:   Unable to determine, based on outcomes.  Discharge Planning: To Be Determined      Primary Diagnoses: Present on Admission: . ESRD (end stage renal disease) ([place]) . Anasarca . Anemia of chronic disease . Hepatic encephalopathy ([place]) . Liver cirrhosis secondary to NASH (nonalcoholic steatohepatitis) ([place]) . Thrombocytopenia ([place]) . Uremia   I have reviewed the medical record, interviewed the patient and family, and examined the patient. The following aspects are pertinent.  Past Medical History:  Diagnosis Date  . Acute blood loss anemia 02/25/2014   Status post transfusion  . Acute renal failure ([place]) 09/2013   Pre-renal- resolved  . Anasarca  10/10/2013  . Anxiety   . Bipolar disorder ([place]) 12/04/2013   2007-SEEN IN ED FOR INVOLUNTARY COMMITMENT, UDS POS FOR AMPHETAMINES/OPIATES   . Bleeding esophageal varices ([place]) 02/28/2014   s/p banding  . C. difficile colitis 04/19/2014  . Chronic hypotension   . Cirrhosis ([place]) 10/05/13   Liver bx 11/23/13 (delayed initially due to patient refusal). c/w steatohepatitis  . Cirrhosis of liver with ascites ([place])   . Depression   . ESRD (end stage renal disease) on dialysis ([place]) 08/2014  . Folate deficiency 09/2013  . Gastroesophageal junction ulcer 09/17/2014  . GERD (gastroesophageal reflux disease)     . Hematemesis/vomiting blood 02/24/2014  . Macrocytosis 02/28/2014  . PNA (pneumonia) 10/13/2013  . SBP (spontaneous bacterial peritonitis) ([place]) 11/10/2013  . Thrombocytopenia ([place])    Hypercellular bone marrow; abundant megakaryocytes per 08/27/2014; s/p bone marrow bx   Social History   Social History  . Marital status: Single    Spouse name: N/A  . Number of children: 1  . Years of education: N/A   Occupational History  . unemployed    Social History Main Topics  . Smoking status: Former Smoker    Packs/day: 0.25    Years: 20.00    Types: Cigarettes    Quit date: 11/05/2008  . Smokeless tobacco: Never Used     Comment: Never really smoked much  . Alcohol use No  . Drug use: No  . Sexual activity: Not Currently    Partners: Male    Birth control/ protection: None   Other Topics Concern  . None   Social History Narrative  . None   Family History  Problem Relation Age of Onset  . Heart disease Mother   . Colon cancer Neg Hx   . Liver disease Neg Hx    Scheduled Meds: . lactulose  10-20 g Oral TID  . lidocaine  10 mL Mouth/Throat TID AC & HS  . midodrine  10 mg Oral Daily  . pantoprazole  40 mg Oral Q0600  . sevelamer carbonate  1,600 mg Oral TID WC  . sucralfate  1 g Oral TID WC & HS  . triamcinolone cream   Topical BID   Continuous Infusions: PRN Meds:.acetaminophen **OR** acetaminophen, heparin, lidocaine (PF), lidocaine-prilocaine, LORazepam, ondansetron **OR** ondansetron (ZOFRAN) IV, oxyCODONE, pentafluoroprop-tetrafluoroeth Medications Prior to Admission:  Prior to Admission medications   Medication Sig Start Date End Date Taking? Authorizing Provider  augmented betamethasone dipropionate (DIPROLENE-AF) 0.05 % cream APPLY TO THE AFFECTED AREAS ON LEGS TWICE DAILY AS NEEDED (NOT FACE, GROIN, OR UNDER ARMS) 12/24/16  Yes [provider]  Darbepoetin Alfa (ARANESP) 60 MCG/0.3ML SOSY injection Inject 0.3 mLs (60 mcg total) into the vein every Thursday  with hemodialysis. Patient taking differently: Inject 60 mcg into the vein every Tuesday with hemodialysis.  06/09/15  Yes Cherene Altes, MD  lactulose (CHRONULAC) 10 GM/15ML solution Take 15-30 mLs (10-20 g total) by mouth 3 (three) times daily. TAKE 15-30 ml BY MOUTH three times a day 12/21/16  Yes Samuella Cota, MD  lidocaine (XYLOCAINE) 2 % solution TAKE TWO TEASPOONSFUL (10ML) BY MOUTH BEFORE MEALS AND AT BEDTIME TO PREVENT CHEST PAIN WHILE EATING 03/03/17  Yes Annitta Needs, NP  lidocaine-prilocaine (EMLA) cream Apply 1 application topically as needed (for dialysis treatments).   Yes [provider]  LORazepam (ATIVAN) 1 MG tablet Take 1/2 to 1 tablet by mouth twice daily as needed for back spasm or for anxiety. 03/24/17  Yes Lewis,  Laureen Ochs, PA-C  midodrine (PROAMATINE) 5 MG tablet TAKE TWO (2) TABLETS BY MOUTH DAILY. TAKE ONLY ON DIALYSIS DAYS ON TUESDAYS, THURDAYS, AND SATURDAYS (SOMETIME TAKES TREATMENTS ON FRIDAY IN 12/29/16  Yes [provider]  ondansetron (ZOFRAN) 4 MG tablet Take 1 tablet (4 mg total) by mouth every 8 (eight) hours as needed for nausea or vomiting. 03/31/17  Yes Carlis Stable, NP  Oxycodone HCl 10 MG TABS Take 1 tablet (10 mg total) by mouth 3 (three) times daily as needed. FOR PAIN 03/24/17  Yes Mahala Menghini, PA-C  RENVELA 800 MG tablet Take 1600 mg by mouth 3 times daily with meals. Take 800 mg by mouth with snacks. 10/29/15  Yes [provider]  sucralfate (CARAFATE) 1 GM/10ML suspension Take 1 g by mouth 4 (four) times daily -  with meals and at bedtime.   Yes [provider]  chlorhexidine gluconate, MEDLINE KIT, (PERIDEX) 0.12 % solution 15 mLs by Mouth Rinse route 2 (two) times daily. Patient not taking: Reported on 04/05/2017 01/19/17   Orvan Falconer, MD  clotrimazole (LOTRIMIN) 1 % cream Apply 1 application topically 3 (three) times daily. Patient not taking: Reported on 04/05/2017 12/25/16   Annitta Needs, NP  omeprazole  (PRILOSEC) 20 MG capsule 1 po every morning 30 minutes prior to your first meal. Patient not taking: Reported on 04/05/2017 09/30/16   Danie Binder, MD  pantoprazole (PROTONIX) 40 MG tablet Take 1 tablet (40 mg total) by mouth daily at 6 (six) AM. Patient not taking: Reported on 04/05/2017 01/20/17   Orvan Falconer, MD   Allergies  Allergen Reactions  . Prednisone Swelling  . Iohexol Rash    Pt states rash after last CT. Pt needs pre-meds  . Lasix [Furosemide] Other (See Comments)    "doesn't work"  . Latex Itching   Review of Systems  Unable to perform ROS: Acuity of condition    Physical Exam  Constitutional: No distress.  HENT:  Head: Atraumatic.  Cardiovascular: Normal rate and regular rhythm.   Pulmonary/Chest: Effort normal. No respiratory distress.  Abdominal: She exhibits distension. There is tenderness.  Musculoskeletal: She exhibits edema.  Neurological: She is alert.  Skin: Skin is warm and dry.  Jaundiced  Nursing note and vitals reviewed.   Vital Signs: BP (!) 96/59   Pulse 82   Temp 97.9 F (36.6 C) (Oral)   Resp 13   Ht 5' (1.524 m)   Wt 84.1 kg (185 lb 6.5 oz)   LMP 09/21/2013   SpO2 99%   BMI 36.21 kg/m  Pain Assessment: 0-10   Pain Score: 10-Worst pain ever   SpO2: SpO2: 99 % O2 Device:SpO2: 99 % O2 Flow Rate: .   IO: Intake/output summary:  Intake/Output Summary (Last 24 hours) at 04/05/17 1159 Last data filed at 04/04/17 1615  Gross per 24 hour  Intake              236 ml  Output             1000 ml  Net             -764 ml    LBM: Last BM Date: 04/05/17 Baseline Weight: Weight: 90.7 kg (200 lb) Most recent weight: Weight: 84.1 kg (185 lb 6.5 oz)     Palliative Assessment/Data:   Flowsheet Rows     Most Recent Value  Intake Tab  Referral Department  Hospitalist  Unit at Time of Referral  ICU  Palliative Care Primary Diagnosis  Nephrology  Date Notified  04/04/17  Palliative Care Type  Return patient Palliative Care  Reason for  referral  Clarify Goals of Care  Date of Admission  04/04/17  Date first seen by Palliative Care  04/05/17  # of days Palliative referral response time  1 Day(s)  # of days IP prior to Palliative referral  0  Clinical Assessment  Palliative Performance Scale Score  30%  Pain Max last 24 hours  Not able to report  Pain Min Last 24 hours  Not able to report  Dyspnea Max Last 24 Hours  Not able to report  Dyspnea Min Last 24 hours  Not able to report  Psychosocial & Spiritual Assessment  Palliative Care Outcomes  Patient/Family meeting held?  Yes  Who was at the meeting?  Patient only at this time  Palliative Care Outcomes  Clarified goals of care, Provided psychosocial or spiritual support      Time In: 1020 Time Out: 1050 Time Total: 30 minutes Greater than 50%  of this time was spent counseling and coordinating care related to the above assessment and plan.  Signed by: Drue Novel, NP   Please contact Palliative Medicine Team phone at [phone] for questions and concerns.  For individual provider: See Shea Evans

## 2017-04-05 NOTE — Progress Notes (Signed)
CRITICAL VALUE ALERT  Critical Value:  Calcium 5.7  Date & Time Notied:  04/05/17 @ 0730  Provider Notified: Memon  Orders Received/Actions taken: Monitoring

## 2017-04-05 NOTE — Progress Notes (Signed)
PROGRESS NOTE    Wanda [name]  YIF:[id number] DOB: 1975/11/20 DOA: 04/04/2017 PCP: [name], MD    Brief Narrative:  41 year old female with a history of end-stage renal disease on dialysis, [name] cirrhosis, long history of noncompliance, presents to the hospital after suffering a fall yesterday. She has not gone for dialysis in over a month. She is grossly volume overloaded. She had significant metabolic acidosis. She's been restarted back on dialysis. Will likely need several sessions in order for adequate fluid removal.   Assessment & Plan:   Active Problems:   Anasarca   Thrombocytopenia (H[facility])   Anemia of chronic disease   Liver cirrhosis secondary to NASH (nonalcoholic steatohepatitis) ([place])   ESRD (end stage renal disease) ([place])   Hepatic encephalopathy ([place])   Uremia   Fall at home, initial encounter   1. End-stage renal disease on hemodialysis. Patient has not had dialysis in over one month. Nephrology consulted and started her back on dialysis. 2. [name] cirrhosis. Patient has evidence of decompensated cirrhosis. Ammonia is also elevated. She reports taking lactulose intermittently. We'll need to continue with volume removal. Will consider evaluating for paracentesis after dialysis today 3. Hepatic encephalopathy. Ammonia elevated at 90. Continue on lactulose. Repeat ammonia in am. 4. Anemia of chronic disease. Hemoglobin trending down. No obvious bleeding. Started on epogen per nephrology. Will transfuse 1 unit prbc today with dialysis. Continue to monitor 5. Thrombocytopenia. Related cirrhosis. 6. Anasarca. Related to noncompliance with dialysis. Continue volume removal with dialysis. 7. Fall. Suspect this mechanical fall. She denies any syncope or head trauma. Continue to monitor. Will need physical therapy evaluation 1. Goals of care. When asked regarding CODE STATUS, patient is unsure whether she wishes to be full code and DO NOT RESUSCITATE. Palliative care  consulted to assist with these discussions   DVT prophylaxis: scd Code Status: full code Family Communication: no family present Disposition Plan: discharge home once improved   Consultants:   Nephrology  Palliative care  Procedures:   Dialysis   Antimicrobials:      Subjective: Feels about the same. Continues to complain of diffuse body pains.   Objective: Vitals:   04/05/17 0500 04/05/17 0600 04/05/17 0800 04/05/17 0826  BP: (!) 91/49 (!) 90/49 (!) 96/59   Pulse: 83 79 82   Resp: (!) 5 19 13    Temp:    97.9 F (36.6 C)  TempSrc:    Oral  SpO2: 97% 99% 99%   Weight: 84.1 kg (185 lb 6.5 oz)     Height:        Intake/Output Summary (Last 24 hours) at 04/05/17 1024 Last data filed at 04/04/17 1615  Gross per 24 hour  Intake              236 ml  Output             1000 ml  Net             -764 ml   Filed Weights   04/04/17 0650 04/04/17 1400 04/05/17 0500  Weight: 90.7 kg (200 lb) 90.7 kg (199 lb 15.3 oz) 84.1 kg (185 lb 6.5 oz)    Examination:  General exam: Appears calm and comfortable  Respiratory system: Clear to auscultation. Respiratory effort normal. Cardiovascular system: S1 & S2 heard, RRR. No JVD, murmurs, rubs, gallops or clicks. 3+ pedal edema with general anasarca. Gastrointestinal system: Abdomen is distended, firm and nontender. No organomegaly or masses felt. Normal bowel sounds heard. Umbilical hernia present that  is nontender Central nervous system: Alert and oriented. No focal neurological deficits. Extremities: Symmetric 5 x 5 power. Skin: bulous lesions noted on bilateral lower extremities Psychiatry: Judgement and insight appear normal. Mood & affect appropriate.     Data Reviewed: I have personally reviewed following labs and imaging studies  CBC:  Recent Labs Lab 04/04/17 0814 04/05/17 0423  WBC 7.6 6.9  NEUTROABS 6.2  --   HGB 7.8* 7.1*  HCT 23.6* 22.0*  MCV 93.7 94.0  PLT 95* 85*   Basic Metabolic Panel:  Recent  Labs Lab 04/04/17 0814 04/05/17 0423  NA 142 140  K 4.9 3.7  CL 100* 100*  CO2 13* 17*  GLUCOSE 93 110*  BUN 147* 110*  CREATININE 20.66* 17.14*  CALCIUM 5.4* 5.7*   GFR: Estimated Creatinine Clearance: 4.1 mL/min (A) (by C-G formula based on SCr of 17.14 mg/dL (H)). Liver Function Tests:  Recent Labs Lab 04/04/17 0814 04/05/17 0423  AST 23 25  ALT 9* 9*  ALKPHOS 96 90  BILITOT 1.4* 1.0  PROT 6.5 5.9*  ALBUMIN 2.7* 2.5*   No results for input(s): LIPASE, AMYLASE in the last 168 hours.  Recent Labs Lab 04/04/17 0834  AMMONIA 90*   Coagulation Profile: No results for input(s): INR, PROTIME in the last 168 hours. Cardiac Enzymes:  Recent Labs Lab 04/04/17 1900  CKTOTAL 483*   BNP (last 3 results) No results for input(s): PROBNP in the last 8760 hours. HbA1C: No results for input(s): HGBA1C in the last 72 hours. CBG: No results for input(s): GLUCAP in the last 168 hours. Lipid Profile: No results for input(s): CHOL, HDL, LDLCALC, TRIG, CHOLHDL, LDLDIRECT in the last 72 hours. Thyroid Function Tests: No results for input(s): TSH, T4TOTAL, FREET4, T3FREE, THYROIDAB in the last 72 hours. Anemia Panel: No results for input(s): VITAMINB12, FOLATE, FERRITIN, TIBC, IRON, RETICCTPCT in the last 72 hours. Sepsis Labs: No results for input(s): PROCALCITON, LATICACIDVEN in the last 168 hours.  Recent Results (from the past 240 hour(s))  MRSA PCR Screening     Status: None   Collection Time: 04/04/17 12:00 PM  Result Value Ref Range Status   MRSA by PCR NEGATIVE NEGATIVE Final    Comment:        The GeneXpert MRSA Assay (FDA approved for NASAL specimens only), is one component of a comprehensive MRSA colonization surveillance program. It is not intended to diagnose MRSA infection nor to guide or monitor treatment for MRSA infections.          Radiology Studies: Dg Pelvis Portable  Result Date: 04/04/2017 CLINICAL DATA:  Fall. EXAM: PORTABLE PELVIS  1-2 VIEWS COMPARISON:  CT from 11/23/2016 FINDINGS: Bone detail is limited due to extensive soft tissue. Pelvic bony ring appears to be grossly intact. Normal alignment of both hips. No gross hip fracture. IMPRESSION: Limited examination of the pelvis as described. No gross acute abnormality. Electronically Signed   By: Markus Daft M.D.   On: 04/04/2017 09:26   Dg Chest Portable 1 View  Result Date: 04/04/2017 CLINICAL DATA:  Patient fell.  Pain. EXAM: PORTABLE CHEST 1 VIEW COMPARISON:  03/02/2017. FINDINGS: Mild cardiac enlargement. Mild vascular congestion. No overt failure or infiltrates. No visible rib fracture. No pneumothorax. Thoracic atherosclerosis. IMPRESSION: Mild cardiac enlargement.  No active infiltrates or failure. Electronically Signed   By: Staci Righter M.D.   On: 04/04/2017 09:26        Scheduled Meds: . lactulose  10-20 g Oral TID  . lidocaine  10 mL Mouth/Throat TID AC & HS  . midodrine  10 mg Oral Daily  . pantoprazole  40 mg Oral Q0600  . sevelamer carbonate  1,600 mg Oral TID WC  . sucralfate  1 g Oral TID WC & HS  . triamcinolone cream   Topical BID   Continuous Infusions: . sodium chloride    . calcium gluconate       LOS: 1 day    Time spent: 74mins    Ozzie Knobel, MD [facility] Pager [phone]  If 7PM-7AM, please contact night-coverage [email or web address] Password [facility] 04/05/2017, 10:24 AM

## 2017-04-06 ENCOUNTER — Telehealth: Payer: Self-pay

## 2017-04-06 ENCOUNTER — Inpatient Hospital Stay (HOSPITAL_COMMUNITY): Payer: Medicare Other

## 2017-04-06 LAB — COMPREHENSIVE METABOLIC PANEL
ALT: 10 U/L — AB (ref 14–54)
ANION GAP: 16 — AB (ref 5–15)
AST: 26 U/L (ref 15–41)
Albumin: 2.5 g/dL — ABNORMAL LOW (ref 3.5–5.0)
Alkaline Phosphatase: 90 U/L (ref 38–126)
BUN: 65 mg/dL — ABNORMAL HIGH (ref 6–20)
CHLORIDE: 100 mmol/L — AB (ref 101–111)
CO2: 22 mmol/L (ref 22–32)
CREATININE: 11.04 mg/dL — AB (ref 0.44–1.00)
Calcium: 6.2 mg/dL — CL (ref 8.9–10.3)
GFR calc non Af Amer: 4 mL/min — ABNORMAL LOW (ref >60)
GFR, EST AFRICAN AMERICAN: 4 mL/min — AB (ref >60)
Glucose, Bld: 95 mg/dL (ref 65–99)
POTASSIUM: 3.5 mmol/L (ref 3.5–5.1)
SODIUM: 138 mmol/L (ref 135–145)
Total Bilirubin: 1.2 mg/dL (ref 0.3–1.2)
Total Protein: 6.1 g/dL — ABNORMAL LOW (ref 6.5–8.1)

## 2017-04-06 LAB — GRAM STAIN

## 2017-04-06 LAB — TYPE AND SCREEN
ABO/RH(D): A POS
Antibody Screen: NEGATIVE
UNIT DIVISION: 0

## 2017-04-06 LAB — CBC
HCT: 24.9 % — ABNORMAL LOW (ref 36.0–46.0)
Hemoglobin: 8.1 g/dL — ABNORMAL LOW (ref 12.0–15.0)
MCH: 30.6 pg (ref 26.0–34.0)
MCHC: 32.5 g/dL (ref 30.0–36.0)
MCV: 94 fL (ref 78.0–100.0)
Platelets: 77 K/uL — ABNORMAL LOW (ref 150–400)
RBC: 2.65 MIL/uL — ABNORMAL LOW (ref 3.87–5.11)
RDW: 16.7 % — ABNORMAL HIGH (ref 11.5–15.5)
WBC: 7.1 K/uL (ref 4.0–10.5)

## 2017-04-06 LAB — BODY FLUID CELL COUNT WITH DIFFERENTIAL
Eos, Fluid: 0 %
Lymphs, Fluid: 36 %
MONOCYTE-MACROPHAGE-SEROUS FLUID: 53 % (ref 50–90)
Neutrophil Count, Fluid: 11 % (ref 0–25)
Total Nucleated Cell Count, Fluid: 12 cu mm (ref 0–1000)

## 2017-04-06 LAB — BPAM RBC
Blood Product Expiration Date: 201809152359
ISSUE DATE / TIME: 201808271442
Unit Type and Rh: 6200

## 2017-04-06 LAB — AMMONIA: Ammonia: 55 umol/L — ABNORMAL HIGH (ref 9–35)

## 2017-04-06 LAB — HEPATITIS B SURFACE ANTIGEN: Hepatitis B Surface Ag: NEGATIVE

## 2017-04-06 MED ORDER — OXYCODONE HCL 5 MG PO TABS
10.0000 mg | ORAL_TABLET | ORAL | Status: AC | PRN
  Administered 2017-04-07 (×2): 10 mg via ORAL
  Filled 2017-04-06 (×2): qty 2

## 2017-04-06 MED ORDER — METHOCARBAMOL 1000 MG/10ML IJ SOLN
500.0000 mg | Freq: Four times a day (QID) | INTRAVENOUS | Status: DC | PRN
  Administered 2017-04-06 – 2017-04-07 (×3): 500 mg via INTRAVENOUS
  Filled 2017-04-06 (×2): qty 550

## 2017-04-06 MED ORDER — METHOCARBAMOL 1000 MG/10ML IJ SOLN
INTRAMUSCULAR | Status: AC
  Filled 2017-04-06: qty 10

## 2017-04-06 MED ORDER — OXYCODONE HCL 5 MG PO TABS
10.0000 mg | ORAL_TABLET | Freq: Four times a day (QID) | ORAL | Status: DC | PRN
  Administered 2017-04-06 (×4): 10 mg via ORAL
  Filled 2017-04-06 (×4): qty 2

## 2017-04-06 MED ORDER — ALBUMIN HUMAN 25 % IV SOLN
50.0000 g | Freq: Once | INTRAVENOUS | Status: AC
  Administered 2017-04-06: 50 g via INTRAVENOUS
  Filled 2017-04-06 (×2): qty 200

## 2017-04-06 NOTE — Progress Notes (Signed)
PROGRESS NOTE    Wanda [name]  YOV:[id number] DOB: 1975/10/31 DOA: 04/04/2017 PCP: [name], MD    Brief Narrative:  41 year old female with a history of end-stage renal disease on dialysis, [name] cirrhosis, long history of noncompliance, presents to the hospital after suffering a fall yesterday. She has not gone for dialysis in over a month. She is grossly volume overloaded. She had significant metabolic acidosis. She's been restarted back on dialysis. Will likely need several sessions in order for adequate fluid removal.   Assessment & Plan:   Active Problems:   Anasarca   Thrombocytopenia (HCC)   Anemia of chronic disease   Liver cirrhosis secondary to NASH (nonalcoholic steatohepatitis) ([place])   ESRD (end stage renal disease) ([place])   Hepatic encephalopathy ([place])   Uremia   Fall at home, initial encounter   1. End-stage renal disease on hemodialysis. Patient has not had dialysis in over one month. Nephrology consulted and started her back on dialysis. She has had 2 dialysis sessions thus far. Next dialysis session to be done on 8/29 2. [place] cirrhosis. Patient has evidence of decompensated cirrhosis. She is undergoing volume removal with dialysis. She appears to have significant ascites. Will request paracentesis to be performed today 3. Hepatic encephalopathy. Ammonia elevated at 90. Continue on lactulose. Repeat ammonia 55. 4. Anemia of chronic disease. Hemoglobin trended down. No obvious bleeding. Started on epogen per nephrology.  Transfused 1 unit prbc today with dialysis on 8/27. Continue to monitor 5. Thrombocytopenia. Related cirrhosis. 6. Anasarca. Related to noncompliance with dialysis. Continue volume removal with dialysis. 7. Fall. Suspect this mechanical fall. She denies any syncope or head trauma. Complains of pain in right hip, although imaging has been unrevealing. Will continue with pain management and muscle relaxants. Will need physical therapy  evaluation 8. Goals of care. When asked regarding CODE STATUS, patient is unsure whether she wishes to be full code or DO NOT RESUSCITATE. Palliative care consulted to assist with these discussions   DVT prophylaxis: scd Code Status: full code Family Communication: no family present Disposition Plan: discharge home once improved   Consultants:   Nephrology  Palliative care  Procedures:   Transfusion 1 unit prbc 8/27  Antimicrobials:      Subjective: Continues to have pain in right hip. She has been refusing some medications  Objective: Vitals:   04/06/17 0300 04/06/17 0400 04/06/17 0500 04/06/17 0600  BP: (!) 65/44 95/66 (!) 90/43 (!) 93/45  Pulse: 93 92 87 88  Resp: (!) 22 13 17  (!) 6  Temp:      TempSrc:      SpO2: 96% 98% 95% 98%  Weight:   85 kg (187 lb 6.3 oz)   Height:        Intake/Output Summary (Last 24 hours) at 04/06/17 7412 Last data filed at 04/05/17 1630  Gross per 24 hour  Intake              315 ml  Output             1000 ml  Net             -685 ml   Filed Weights   04/05/17 0500 04/05/17 1315 04/06/17 0500  Weight: 84.1 kg (185 lb 6.5 oz) 84.1 kg (185 lb 6.5 oz) 85 kg (187 lb 6.3 oz)    Examination:  General exam: Appears calm and comfortable  Respiratory system: Clear to auscultation. Respiratory effort normal. Cardiovascular system: S1 & S2 heard, RRR. No  JVD, murmurs, rubs, gallops or clicks. 3+ pedal edema. Gastrointestinal system: Abdomen is distended, firm and diffusely tender. No organomegaly or masses felt. Normal bowel sounds heard.umbilical hernia present Central nervous system: Alert and oriented. No focal neurological deficits. Extremities: Symmetric 5 x 5 power. Skin: venous stasis changes noted in LE bilaterally Psychiatry: Judgement and insight appear normal. Mood & affect appropriate.     Data Reviewed: I have personally reviewed following labs and imaging studies  CBC:  Recent Labs Lab 04/04/17 0814  04/05/17 0423 04/06/17 0604  WBC 7.6 6.9 7.1  NEUTROABS 6.2  --   --   HGB 7.8* 7.1* 8.1*  HCT 23.6* 22.0* 24.9*  MCV 93.7 94.0 94.0  PLT 95* 85* 77*   Basic Metabolic Panel:  Recent Labs Lab 04/04/17 0814 04/05/17 0423 04/06/17 0604  NA 142 140 138  K 4.9 3.7 3.5  CL 100* 100* 100*  CO2 13* 17* 22  GLUCOSE 93 110* 95  BUN 147* 110* 65*  CREATININE 20.66* 17.14* 11.04*  CALCIUM 5.4* 5.7* 6.2*   GFR: Estimated Creatinine Clearance: 6.4 mL/min (A) (by C-G formula based on SCr of 11.04 mg/dL (H)). Liver Function Tests:  Recent Labs Lab 04/04/17 0814 04/05/17 0423 04/06/17 0604  AST 23 25 26   ALT 9* 9* 10*  ALKPHOS 96 90 90  BILITOT 1.4* 1.0 1.2  PROT 6.5 5.9* 6.1*  ALBUMIN 2.7* 2.5* 2.5*   No results for input(s): LIPASE, AMYLASE in the last 168 hours.  Recent Labs Lab 04/04/17 0834 04/06/17 0604  AMMONIA 90* 55*   Coagulation Profile: No results for input(s): INR, PROTIME in the last 168 hours. Cardiac Enzymes:  Recent Labs Lab 04/04/17 1900  CKTOTAL 483*   BNP (last 3 results) No results for input(s): PROBNP in the last 8760 hours. HbA1C: No results for input(s): HGBA1C in the last 72 hours. CBG: No results for input(s): GLUCAP in the last 168 hours. Lipid Profile: No results for input(s): CHOL, HDL, LDLCALC, TRIG, CHOLHDL, LDLDIRECT in the last 72 hours. Thyroid Function Tests: No results for input(s): TSH, T4TOTAL, FREET4, T3FREE, THYROIDAB in the last 72 hours. Anemia Panel: No results for input(s): VITAMINB12, FOLATE, FERRITIN, TIBC, IRON, RETICCTPCT in the last 72 hours. Sepsis Labs: No results for input(s): PROCALCITON, LATICACIDVEN in the last 168 hours.  Recent Results (from the past 240 hour(s))  MRSA PCR Screening     Status: None   Collection Time: 04/04/17 12:00 PM  Result Value Ref Range Status   MRSA by PCR NEGATIVE NEGATIVE Final    Comment:        The GeneXpert MRSA Assay (FDA approved for NASAL specimens only), is one  component of a comprehensive MRSA colonization surveillance program. It is not intended to diagnose MRSA infection nor to guide or monitor treatment for MRSA infections.          Radiology Studies: Dg Pelvis Portable  Result Date: 04/04/2017 CLINICAL DATA:  Fall. EXAM: PORTABLE PELVIS 1-2 VIEWS COMPARISON:  CT from 11/23/2016 FINDINGS: Bone detail is limited due to extensive soft tissue. Pelvic bony ring appears to be grossly intact. Normal alignment of both hips. No gross hip fracture. IMPRESSION: Limited examination of the pelvis as described. No gross acute abnormality. Electronically Signed   By: Markus Daft M.D.   On: 04/04/2017 09:26   Dg Chest Portable 1 View  Result Date: 04/04/2017 CLINICAL DATA:  Patient fell.  Pain. EXAM: PORTABLE CHEST 1 VIEW COMPARISON:  03/02/2017. FINDINGS: Mild cardiac enlargement. Mild vascular  congestion. No overt failure or infiltrates. No visible rib fracture. No pneumothorax. Thoracic atherosclerosis. IMPRESSION: Mild cardiac enlargement.  No active infiltrates or failure. Electronically Signed   By: Staci Righter M.D.   On: 04/04/2017 09:26        Scheduled Meds: . lactulose  10-20 g Oral TID  . lidocaine  10 mL Mouth/Throat TID AC & HS  . midodrine  10 mg Oral Daily  . pantoprazole  40 mg Oral Q0600  . sevelamer carbonate  1,600 mg Oral TID WC  . sucralfate  1 g Oral TID WC & HS  . triamcinolone cream   Topical BID   Continuous Infusions: . methocarbamol (ROBAXIN)  IV       LOS: 2 days    Time spent: 72mins    Charese Abundis, MD [facility] Pager [phone]  If 7PM-7AM, please contact night-coverage [email or web address] Password [email or web address] 04/06/2017, 8:22 AM

## 2017-04-06 NOTE — Telephone Encounter (Signed)
Pt called and said she is inpatient at [facility] for a fall that she had on Sat and hurt her arm and leg. She wanted to let Dr. Oneida Alar know, and see if she could come by. She is aware she is not hospitalized for GI , but I will let Dr. Oneida Alar know she is there. She had a fall in her kitchen after [name] had mopped the floor. She said floor was not wet, but it was very shiny. Did not break any bones, just bruised up.

## 2017-04-06 NOTE — Procedures (Signed)
PreOperative Dx: Cirrhosis due to NASH, ascites Postoperative Dx: Cirrhosis due to NASH, ascites Procedure:   US guided paracentesis Radiologist:  Thornton Papas Anesthesia:  10 ml of1% lidocaine Specimen:  9.2 L of yellow ascitic fluid EBL:   < 1 ml Complications: None

## 2017-04-06 NOTE — Progress Notes (Addendum)
[email or web address]: Interval History: Still she feels weak. She has some nausea but no vomiting. She claims she didn't seem that much of a difference since he came to the hospital.  Objective: Vital signs in last 24 hours: Temp:  [97.6 F (36.4 C)-97.9 F (36.6 C)] 97.6 F (36.4 C) (08/28 0000) Pulse Rate:  [82-98] 88 (08/28 0600) Resp:  [6-22] 6 (08/28 0600) BP: (65-110)/(43-66) 93/45 (08/28 0600) SpO2:  [94 %-100 %] 98 % (08/28 0600) Weight:  [84.1 kg (185 lb 6.5 oz)-85 kg (187 lb 6.3 oz)] 85 kg (187 lb 6.3 oz) (08/28 0500) Weight change: -6.6 kg (-14 lb 8.8 oz)  Intake/Output from previous day: 08/27 0701 - 08/28 0700 In: 315 [Blood:315] Out: 1000  Intake/Output this shift: No intake/output data recorded.  Generally patient is sleepy but arousable. Answers questions. Chest: Decreased breath sound bilaterally Heart exam revealed regular rate and rhythm Abdomen: Distended, nontender Extremities: She has 3+ edema  Lab Results:  Recent Labs  04/05/17 0423 04/06/17 0604  WBC 6.9 7.1  HGB 7.1* 8.1*  HCT 22.0* 24.9*  PLT 85* 77*   BMET:   Recent Labs  04/05/17 0423 04/06/17 0604  NA 140 138  K 3.7 3.5  CL 100* 100*  CO2 17* 22  GLUCOSE 110* 95  BUN 110* 65*  CREATININE 17.14* 11.04*  CALCIUM 5.7* 6.2*   No results for input(s): PTH in the last 72 hours. Iron Studies: No results for input(s): IRON, TIBC, TRANSFERRIN, FERRITIN in the last 72 hours.  Studies/Results: Dg Pelvis Portable  Result Date: 04/04/2017 CLINICAL DATA:  Fall. EXAM: PORTABLE PELVIS 1-2 VIEWS COMPARISON:  CT from 11/23/2016 FINDINGS: Bone detail is limited due to extensive soft tissue. Pelvic bony ring appears to be grossly intact. Normal alignment of both hips. No gross hip fracture. IMPRESSION: Limited examination of the pelvis as described. No gross acute abnormality. Electronically Signed   By: Markus Daft M.D.   On: 04/04/2017 09:26   Dg Chest Portable 1 View  Result Date:  04/04/2017 CLINICAL DATA:  Patient fell.  Pain. EXAM: PORTABLE CHEST 1 VIEW COMPARISON:  03/02/2017. FINDINGS: Mild cardiac enlargement. Mild vascular congestion. No overt failure or infiltrates. No visible rib fracture. No pneumothorax. Thoracic atherosclerosis. IMPRESSION: Mild cardiac enlargement.  No active infiltrates or failure. Electronically Signed   By: Staci Righter M.D.   On: 04/04/2017 09:26    I have reviewed the patient's current medications.  Assessment/Plan: Problem #1 end-stage renal disease: Patient noncompliant with dialysis. She is status post hemodialysis the last 2 days. Her BUN and creatinine has come down. Problem #2 anasarca: Patient with liver cirrhosis. Uncontrolled salt and fluid intake and lack of dialysis . She still has  significant sign of fluid overload. Problem #3 anemia: Her hemoglobin remains low but improving. Problem #4 noncompliance with her dialysis. This is a recurrent problem. The last couple of months she has stopped coming to the dialysis unit. She goes to different hospitals to get her dialysis when she becomes sick. Problem #5 bone and mineral disorder: Her calcium is low but progressively improving. Problem #6 history of depression Problem #7 hypotension Plan: 1] We'll dialyze patient tomorrow and try to remove about 3 L if her blood pressure tolerates. 2] will check a renal panel and CBC in the morning 3] will continue to encourage patient to come to dialysis for her regular treatment.   LOS: 2 days   Jeromey Kruer S 04/06/2017,7:51 AM

## 2017-04-07 DIAGNOSIS — Z7189 Other specified counseling: Secondary | ICD-10-CM

## 2017-04-07 LAB — BASIC METABOLIC PANEL
ANION GAP: 18 — AB (ref 5–15)
BUN: 75 mg/dL — ABNORMAL HIGH (ref 6–20)
CHLORIDE: 96 mmol/L — AB (ref 101–111)
CO2: 22 mmol/L (ref 22–32)
Calcium: 6.7 mg/dL — ABNORMAL LOW (ref 8.9–10.3)
Creatinine, Ser: 11.43 mg/dL — ABNORMAL HIGH (ref 0.44–1.00)
GFR calc non Af Amer: 4 mL/min — ABNORMAL LOW (ref >60)
GFR, EST AFRICAN AMERICAN: 4 mL/min — AB (ref >60)
GLUCOSE: 89 mg/dL (ref 65–99)
POTASSIUM: 3.8 mmol/L (ref 3.5–5.1)
Sodium: 136 mmol/L (ref 135–145)

## 2017-04-07 LAB — CBC
HEMATOCRIT: 25.4 % — AB (ref 36.0–46.0)
HEMOGLOBIN: 8.2 g/dL — AB (ref 12.0–15.0)
MCH: 30.4 pg (ref 26.0–34.0)
MCHC: 32.3 g/dL (ref 30.0–36.0)
MCV: 94.1 fL (ref 78.0–100.0)
Platelets: 85 10*3/uL — ABNORMAL LOW (ref 150–400)
RBC: 2.7 MIL/uL — AB (ref 3.87–5.11)
RDW: 16.5 % — ABNORMAL HIGH (ref 11.5–15.5)
WBC: 7.4 10*3/uL (ref 4.0–10.5)

## 2017-04-07 LAB — PATHOLOGIST SMEAR REVIEW

## 2017-04-07 MED ORDER — OXYCODONE HCL 5 MG PO TABS
10.0000 mg | ORAL_TABLET | Freq: Four times a day (QID) | ORAL | Status: DC | PRN
  Administered 2017-04-07: 10 mg via ORAL
  Filled 2017-04-07: qty 2

## 2017-04-07 MED ORDER — ALTEPLASE 2 MG IJ SOLR
2.0000 mg | Freq: Once | INTRAMUSCULAR | Status: DC | PRN
  Filled 2017-04-07: qty 2

## 2017-04-07 MED ORDER — EPOETIN ALFA 10000 UNIT/ML IJ SOLN
10000.0000 [IU] | INTRAMUSCULAR | Status: DC
  Administered 2017-04-07: 10000 [IU] via SUBCUTANEOUS
  Filled 2017-04-07 (×2): qty 1

## 2017-04-07 MED ORDER — METHOCARBAMOL 1000 MG/10ML IJ SOLN
INTRAMUSCULAR | Status: AC
  Filled 2017-04-07: qty 10

## 2017-04-07 MED ORDER — METHOCARBAMOL 500 MG PO TABS
500.0000 mg | ORAL_TABLET | Freq: Four times a day (QID) | ORAL | Status: DC | PRN
  Administered 2017-04-07 – 2017-04-15 (×28): 500 mg via ORAL
  Filled 2017-04-07 (×28): qty 1

## 2017-04-07 MED ORDER — OXYCODONE HCL 5 MG PO TABS: 15.0000 mg | ORAL_TABLET | Freq: Four times a day (QID) | ORAL | Status: DC | PRN

## 2017-04-07 MED ORDER — SODIUM CHLORIDE 0.9 % IV SOLN: 100.0000 mL | INTRAVENOUS | Status: DC | PRN

## 2017-04-07 MED ORDER — OXYCODONE HCL 5 MG PO TABS
15.0000 mg | ORAL_TABLET | Freq: Four times a day (QID) | ORAL | Status: DC | PRN
  Administered 2017-04-07 – 2017-04-08 (×3): 15 mg via ORAL
  Filled 2017-04-07 (×3): qty 3

## 2017-04-07 MED ORDER — EPOETIN ALFA 10000 UNIT/ML IJ SOLN
INTRAMUSCULAR | Status: AC
  Administered 2017-04-07: 10000 [IU] via SUBCUTANEOUS
  Filled 2017-04-07: qty 1

## 2017-04-07 NOTE — Progress Notes (Signed)
Wanda [name]  MRN: [id number]  DOB/AGE: April 19, 1976 41 y.o.  Primary Care Physician:Roberson, [name], MD  Admit date: 04/04/2017  Chief Complaint:  Chief Complaint  Patient presents with  . Fall    S-Pt presented on  04/04/2017 with  Chief Complaint  Patient presents with  . Fall  .    Pt offer no new complaints.     Pt says " I have pain all over"     Pt is unable to qualify it further  meds   . lactulose  10-20 g Oral TID  . lidocaine  10 mL Mouth/Throat TID AC & HS  . midodrine  10 mg Oral Daily  . pantoprazole  40 mg Oral Q0600  . sevelamer carbonate  1,600 mg Oral TID WC  . sucralfate  1 g Oral TID WC & HS  . triamcinolone cream   Topical BID      Physical Exam: Vital signs in last 24 hours: Temp:  [98.4 F (36.9 C)-99.1 F (37.3 C)] 98.4 F (36.9 C) (08/29 0510) Pulse Rate:  [84-97] 84 (08/29 0510) Resp:  [6-18] 18 (08/29 0510) BP: (93-107)/(41-60) 100/51 (08/29 0510) SpO2:  [98 %-100 %] 100 % (08/29 0510) Weight change:  Last BM Date: 04/05/17  Intake/Output from previous day: 08/28 0701 - 08/29 0700 In: 490 [P.O.:240; IV Piggyback:250] Out: 1 [Stool:1] No intake/output data recorded.   Physical Exam: General- pt is awake,alert, oriented to time place and person Resp- No acute REsp distress, NO Rhonchi CVS- S1S2 regular in rate and rhythm GIT- BS+, soft, NT, distended EXT- 2+ LE Edema, Cyanosis Access- AVF +  Lab Results: CBC  Recent Labs  04/06/17 0604 04/07/17 0540  WBC 7.1 7.4  HGB 8.1* 8.2*  HCT 24.9* 25.4*  PLT 77* 85*    BMET  Recent Labs  04/06/17 0604 04/07/17 0540  NA 138 136  K 3.5 3.8  CL 100* 96*  CO2 22 22  GLUCOSE 95 89  BUN 65* 75*  CREATININE 11.04* 11.43*  CALCIUM 6.2* 6.7*    MICRO Recent Results (from the past 240 hour(s))  MRSA PCR Screening     Status: None   Collection Time: 04/04/17 12:00 PM  Result Value Ref Range Status   MRSA by PCR NEGATIVE NEGATIVE Final    Comment:        The  GeneXpert MRSA Assay (FDA approved for NASAL specimens only), is one component of a comprehensive MRSA colonization surveillance program. It is not intended to diagnose MRSA infection nor to guide or monitor treatment for MRSA infections.   Culture, body fluid-bottle     Status: None (Preliminary result)   Collection Time: 04/06/17  2:00 PM  Result Value Ref Range Status   Specimen Description ASCITIC COLLECTED BY DOCTOR  Final   Special Requests BOTTLES DRAWN AEROBIC AND ANAEROBIC 10 CC EACH  Final   Culture NO GROWTH < 24 HOURS  Final   Report Status PENDING  Incomplete  Gram stain     Status: None   Collection Time: 04/06/17  2:00 PM  Result Value Ref Range Status   Specimen Description ASCITIC  Final   Special Requests NONE  Final   Gram Stain   Final    CYTOSPIN SMEAR NO ORGANISMS SEEN WBC SEEN WBC PRESENT, PREDOMINANTLY MONONUCLEAR    Report Status 04/06/2017 FINAL  Final      Lab Results  Component Value Date   CALCIUM 6.7 (L) 04/07/2017   CAION 0.86 (LL) 01/12/2017  PHOS 7.8 (H) 01/19/2017               Impression: 1)RenalESRD  HD started on  08/18/14  Pt is on Tue/Thurs/Saturday schedule   Pt has have hx of non adherence to dialysis                Pt missed last month of HD               Pt was dialyzed on 8.26.18 and 8.27.18                Will dialyze again today  2)CVS- Hemodynamically fragile  BP low  On Midodrine   3)Anemia IN ESRD the goal for HGb is  9--11 Pt hgb is not at  the goal  hgb better  on  epo    4)Liver- Cirrhosis Sec to Steato hepatitis s/sp tap Primary team following  5)Electrolytes  Normokalemia  Normonatremia    6)IHypocalcemia- sec to non adherence to dialysis tx better On high calcium bath   7)Acid base Co2 now   at goal NON AG acidosis sec to non adherence o dialysis tx at the time of admmission   8)  Thrombocytopenia-Stable Primary MD following      Plan:  Will dialyze today Will use 3k/3.5 ca bath Will keep on epo Will try to take 3 liters off  I educated pt about need for adhrence to dialysis tx.   BHUTANI,MANPREET S 04/07/2017, 9:43 AM

## 2017-04-07 NOTE — Care Management Important Message (Signed)
Important Message  Patient Details  Name: OPIE FANTON MRN: [id number] Date of Birth: 08/14/1975   Medicare Important Message Given:  Yes    Arlina Sabina, Chauncey Reading, RN 04/07/2017, 11:16 AM

## 2017-04-07 NOTE — Progress Notes (Signed)
Daily Progress Note   Patient Name: Wanda [name]       Date: 04/07/2017 DOB: Dec 15, 1975  Age: 41 y.o. MRN#: [id number] Attending Physician: Samuella Cota, MD Primary Care Physician: [name], MD Admit Date: 04/04/2017  Reason for Consultation/Follow-up: Establishing goals of care and Psychosocial/spiritual support  Subjective: Wanda [name] is resting quietly in bed. She makes but does not keep eye contact as I enter. Present today at bedside is significant other of 13 years, [name]. We talk about dialysis today. We also talk about paracentesis yesterday and removal of 9.2 L.  I talked to Wanda [name] about why Wanda [name] does not attend dialysis. He shares that he can't "drag her out the door". I share that we understand, and agree. Wanda [name] makes eye contact with me, and I ask her about dialysis treatments. She states, "Everybody keeps asking me these questions, and one answer is never good enough". She tells me that she didn't go because she "has these wounds, lesions, just so much going on".  I share with Wanda [name] that and her that we can not heal these wounds without her going to dialysis. She talks about her pain, and I share that the pain and wounds are related to fluid overload. She states, "that's what they say". I share that's what we believe.  I share that she has the benefit of hospice available.  Wanda [name] states that his mother and grandmother had these services. I share with Wanda [name] that we will continue to care for her, that hospice I believe would be a good fit. She has no response. I share that I will follow-up with her on Friday if she is still here.  Length of Stay: 3  Current Medications: Scheduled Meds:  . epoetin (EPOGEN/PROCRIT) injection  10,000 Units  Subcutaneous Q M,W,F-HD  . lactulose  10-20 g Oral TID  . lidocaine  10 mL Mouth/Throat TID AC & HS  . midodrine  10 mg Oral Daily  . pantoprazole  40 mg Oral Q0600  . sevelamer carbonate  1,600 mg Oral TID WC  . sucralfate  1 g Oral TID WC & HS  . triamcinolone cream   Topical BID    Continuous Infusions: . methocarbamol (ROBAXIN)  IV Stopped (04/07/17 0150)    PRN Meds: acetaminophen **OR** acetaminophen, heparin, lidocaine (  PF), lidocaine-prilocaine, LORazepam, methocarbamol (ROBAXIN)  IV, ondansetron **OR** ondansetron (ZOFRAN) IV, oxyCODONE, pentafluoroprop-tetrafluoroeth  Physical Exam  Constitutional: No distress.  Appears frail, chronically ill, makes but does not keep eye contact  HENT:  Head: Atraumatic.  Cardiovascular: Normal rate and regular rhythm.   Pulmonary/Chest: Effort normal. No respiratory distress.  Abdominal: Soft. She exhibits distension.  Musculoskeletal: She exhibits edema.  Neurological: She is alert.  Skin: Skin is warm and dry.  Jaundiced  Nursing note and vitals reviewed.           Vital Signs: BP (!) 100/51 (BP Location: Left Arm)   Pulse 84   Temp 98.4 F (36.9 C) (Oral)   Resp 18   Ht 5' (1.524 m)   Wt 85 kg (187 lb 6.3 oz)   LMP 09/21/2013   SpO2 100%   BMI 36.60 kg/m  SpO2: SpO2: 100 % O2 Device: O2 Device: Not Delivered O2 Flow Rate:    Intake/output summary:  Intake/Output Summary (Last 24 hours) at 04/07/17 1108 Last data filed at 04/07/17 0510  Gross per 24 hour  Intake              440 ml  Output                1 ml  Net              439 ml   LBM: Last BM Date: 04/07/17 Baseline Weight: Weight: 90.7 kg (200 lb) Most recent weight: Weight: 85 kg (187 lb 6.3 oz)       Palliative Assessment/Data:    Flowsheet Rows     Most Recent Value  Intake Tab  Referral Department  Hospitalist  Unit at Time of Referral  ICU  Palliative Care Primary Diagnosis  Nephrology  Date Notified  04/04/17  Palliative Care Type   Return patient Palliative Care  Reason for referral  Clarify Goals of Care  Date of Admission  04/04/17  Date first seen by Palliative Care  04/05/17  # of days Palliative referral response time  1 Day(s)  # of days IP prior to Palliative referral  0  Clinical Assessment  Palliative Performance Scale Score  30%  Pain Max last 24 hours  Not able to report  Pain Min Last 24 hours  Not able to report  Dyspnea Max Last 24 Hours  Not able to report  Dyspnea Min Last 24 hours  Not able to report  Psychosocial & Spiritual Assessment  Palliative Care Outcomes  Patient/Family meeting held?  Yes  Who was at the meeting?  Patient only at this time  Palliative Care Outcomes  Clarified goals of care, Provided psychosocial or spiritual support      Patient Active Problem List   Diagnosis Date Noted  . Fall at home, initial encounter 04/04/2017  . Encounter for orogastric (OG) tube placement   . Hepatic encephalopathy ([place])   . Uremia   . Altered mental status 01/12/2017  . Cellulitis 01/12/2017  . Bullous skin disease 12/22/2016  . ESRD (end stage renal disease) ([place]) 12/13/2016  . Volume overload 12/13/2016  . History of noncompliance with medical treatment   . Liver cirrhosis secondary to NASH (nonalcoholic steatohepatitis) ([place])   . Palliative care encounter   . Pulmonary edema 11/20/2016  . Hypocalcemia 11/20/2016  . Cardiomyopathy- ? ischemic  10/09/2016  . Troponin level elevated 10/09/2016  . Goals of care, counseling/discussion   . Palliative care by specialist   . Seizure ([place]) 10/07/2016  .  Pressure injury of skin 05/20/2016  . Shock ([place]) 05/19/2016  . Rhonchi   . Chronic pain syndrome 08/22/2015  . Abdominal pain 08/07/2015  . Umbilical hernia without obstruction and without gangrene 08/07/2015  . GI bleed   . Acute blood loss anemia   . Hypotension 06/05/2015  . Edema of left lower extremity 02/22/2015  . Ascites   . History of esophageal varices with bleeding  09/16/2014  . End stage renal disease on dialysis ([place])   . Anemia of chronic disease   . Thrombocytopenia ([place]) 08/19/2014  . Hyponatremia 08/17/2014  . Esophageal varices without bleeding ([place])   . Portal hypertensive gastropathy ([place])   . Malnutrition of moderate degree ([place]) 05/15/2014  . Bipolar disorder ([place]) 12/04/2013  . SBP (spontaneous bacterial peritonitis) ([place]) 11/10/2013  . Liver cirrhosis secondary to nonalcoholic steatohepatitis (NASH) ([place]) 11/07/2013  . Folate deficiency 10/13/2013  . Anasarca 10/10/2013  . Anemia 10/06/2013    Palliative Care Assessment & Plan   Patient Profile: 41 y.o. female  with past medical history of Acute renal failure with hemodialysis, approximately one month since last dialysis treatment, cirrhosis of liver with ascites, history of SBP, C diff colitis, bleeding esophageal varices, anasarca, history of pneumonia, anxiety and depression admitted on 04/04/2017 with fluid overload, end stage renal disease, need for dialysis.   Assessment: ESRD; Dialysis today. Wanda [name] has not attended dialysis sessions for over one month. When asked about why she didn't go she states that she had so many other things going on including wounds and lesions. Significant other of 13 years, Wanda [name], is at bedside and he states that he can't make her go. ESLD: paracentesis yesterday with 9.2 L removed.  Recommendations/Plan:  Wanda [name] is unable to use her voice. She is unable to say that she will continue to make her HD appts OR that she will focus on comfort and take Hospice services in home.   Goals of Care and Additional Recommendations:  Limitations on Scope of Treatment: Full Scope Treatment  Code Status:    Code Status Orders        Start     Ordered   04/04/17 1736  Full code  Continuous     04/04/17 1737    Code Status History    Date Active Date Inactive Code Status Order ID Comments User Context   01/13/2017 12:00 AM 01/19/2017  6:04 PM Full  Code [id number]  Oswald Hillock, MD Inpatient   12/22/2016 10:41 PM 12/24/2016  5:04 PM Full Code [id number]  Bethena Roys, MD Inpatient   12/13/2016 10:41 AM 12/21/2016 10:50 PM Full Code [id number]  Kathie Dike, MD Inpatient   11/20/2016  9:55 PM 11/26/2016  8:13 PM Full Code [id number]  Truett Mainland, DO Inpatient   10/30/2016  3:58 AM 10/30/2016 10:01 PM Full Code [id number]  Vianne Bulls, MD ED   10/07/2016  9:56 PM 10/10/2016  8:30 PM Full Code [id number]  Omar Person, NP Inpatient   09/23/2016 12:48 AM 09/27/2016 11:58 PM Full Code [id number]  Corey Harold, NP Inpatient   05/19/2016  7:40 PM 05/27/2016  6:47 PM Full Code [id number]  Corey Harold, NP Inpatient   04/29/2016  2:45 AM 04/30/2016  9:15 AM Full Code [id number]  Orvan Falconer, MD Inpatient   11/06/2015  4:35 AM 11/07/2015  5:21 PM Full Code [id number]  Phillips Grout, MD ED   08/20/2015  8:34 AM 08/22/2015  7:05 PM Full Code [id number]  [place],  Darylene Price, MD Inpatient   06/04/2015 10:06 PM 06/09/2015  9:37 PM Full Code [id number]  Corey Harold, NP Inpatient   10/25/2014 10:02 PM 11/01/2014  4:01 PM Full Code [id number]  Doree Albee, MD ED   09/16/2014  2:39 AM 09/19/2014  7:30 PM Full Code [id number]  Phillips Grout, MD Inpatient   08/27/2014  5:28 PM 08/29/2014 12:10 AM Full Code [id number]  Kathie Dike, MD Inpatient   08/27/2014 11:53 AM 08/27/2014  5:28 PM Full Code [id number]  Azzie Roup, MD HOV   08/13/2014 11:03 PM 08/27/2014 11:53 AM Full Code [id number]  Jani Gravel, MD Inpatient   07/03/2014  6:09 PM 07/07/2014 11:07 PM Full Code [id number]  Waldemar Dickens, MD Inpatient   05/14/2014  6:30 PM 05/18/2014  5:03 PM Full Code [id number]  Waldemar Dickens, MD Inpatient   04/19/2014  9:56 PM 04/27/2014  6:33 PM Full Code [id number]  Caren Griffins, MD ED   02/25/2014 12:46 AM 03/02/2014  5:03 PM Full Code [id number]  Bonnielee Haff, MD Inpatient   11/23/2013 11:52 AM 11/24/2013  3:31 AM Full Code [id number]  Arne Cleveland, MD  HOV   11/05/2013  9:15 PM 11/17/2013  8:47 PM Full Code [id number]  Allyne Gee, MD Inpatient   10/06/2013  1:20 AM 10/13/2013  9:21 PM Full Code [id number]  Bonnielee Haff, MD ED   09/11/2013 11:21 PM 09/13/2013  5:23 PM Full Code [id number]  Bonnielee Haff, MD Inpatient       Prognosis:   Unable to determine, based on outcomes, 3-6 months would not be surprising based on functional decline, declining HD treatments.   Discharge Planning:  likely home without [facility] services, as per her norn.   Care plan was discussed with nursing staff, CM, SW, Dr. Theador Hawthorne, and Dr. Sarajane Jews.   Thank you for allowing the Palliative Medicine Team to assist in the care of this patient.   Time In: 1030 Time Out: 1055 Total Time 25 minutes Prolonged Time Billed  no       Greater than 50%  of this time was spent counseling and coordinating care related to the above assessment and plan.  Drue Novel, NP  Please contact Palliative Medicine Team phone at [phone] for questions and concerns.

## 2017-04-07 NOTE — Progress Notes (Signed)
  PROGRESS NOTE  Wanda [name] BPZ:[id number] DOB: 1975/09/06 DOA: 04/04/2017 PCP: [name], MD  Brief Narrative: 41 year old woman PMH NASH cirrhosis, end-stage renal disease on hemodialysis, persistent noncompliance with hemodialysis and medications who presented with generalized pain after a fall at home, progressive lower extremity edema. Reportedly no dialysis over one month. Admitted for massive volume overload.  Assessment/Plan End-stage renal disease on hemodialysis with massive volume overload/anasarca, associated hypocalcemia secondary to nonadherence to dialysis. Noncompliant with hemodialysis for 1 month. -Management per nephrology. Dialysis planned again today.  NASH cirrhosis with ascites, acute hepatic encephalopathy, thrombocytopenia, esophageal varices. Status post large volume paracentesis 8/28. -Ammonia level trending down. Continue lactulose.  Chronic hypotension. -Stable. Continue Midrin.  Anemia of end-stage renal disease. Status post 1 unit PRBC transfusion 8/27.  Fall at home. Reported right hip pain. X-rays negative. Physical therapy evaluation.  DVT prophylaxis: SCDs Code Status: full Family Communication: none Disposition Plan: home    Murray Hodgkins, MD  [facility] Direct contact: [phone] --Via [place]  --[email or web address]; password [email or web address]  7PM-7AM contact night coverage as above 04/07/2017, 5:28 PM  LOS: 3 days   Consultants:  Nephrology  Palliative medicine  Procedures:  Transfusion one unit PRBC 8/27  Large-volume paracentesis 8/28  Antimicrobials:    Interval history/Subjective: Patient reports some pain in her right side.  Objective: Vitals: Afebrile, 98.4, 18, 92, 119/56, 100% on room air  Exam:     Constitutional: Appears calm, comfortable.  Psychiatric. Appears depressed. Flat affect.  Cardiovascular. Regular rate and rhythm. No murmur, rub or gallop. Massive 3+ bilateral lower extremity  edema.  Respiratory. Clear to auscultation bilaterally. No wheezes, rales or rhonchi. Normal respiratory effort.  Abdomen is distended, nontender.   I have personally reviewed the following:   Labs:  Basic metabolic panel consistent with end-stage renal disease. Potassium within normal limits.  Hemoglobin stable 8.2, platelets stable 85, WBC 7.4.  Imaging studies:  Portable pelvis film no acute abnormalities noted   Scheduled Meds: . epoetin (EPOGEN/PROCRIT) injection  10,000 Units Subcutaneous Q M,W,F-HD  . lactulose  10-20 g Oral TID  . lidocaine  10 mL Mouth/Throat TID AC & HS  . midodrine  10 mg Oral Daily  . pantoprazole  40 mg Oral Q0600  . sevelamer carbonate  1,600 mg Oral TID WC  . sucralfate  1 g Oral TID WC & HS  . triamcinolone cream   Topical BID   Continuous Infusions: . sodium chloride    . sodium chloride      Principal Problem:   ESRD (end stage renal disease) ([facility]) Active Problems:   Anasarca   Thrombocytopenia ([facility])   Anemia of chronic disease   Pressure injury of skin   Liver cirrhosis secondary to NASH (nonalcoholic steatohepatitis) ([place])   Hepatic encephalopathy ([place])   Uremia   Fall at home, initial encounter   Encounter for hospice care discussion   LOS: 3 days

## 2017-04-08 MED ORDER — OXYCODONE HCL 5 MG PO TABS
15.0000 mg | ORAL_TABLET | ORAL | Status: DC | PRN
  Administered 2017-04-08 – 2017-04-15 (×37): 15 mg via ORAL
  Filled 2017-04-08 (×39): qty 3

## 2017-04-08 MED ORDER — MIDODRINE HCL 5 MG PO TABS
10.0000 mg | ORAL_TABLET | Freq: Two times a day (BID) | ORAL | Status: DC
  Administered 2017-04-08 – 2017-04-15 (×12): 10 mg via ORAL
  Filled 2017-04-08 (×12): qty 2

## 2017-04-08 MED ORDER — EPOETIN ALFA 10000 UNIT/ML IJ SOLN
10000.0000 [IU] | INTRAMUSCULAR | Status: DC
  Administered 2017-04-09 – 2017-04-14 (×3): 10000 [IU] via INTRAVENOUS
  Filled 2017-04-08 (×4): qty 1

## 2017-04-08 NOTE — Progress Notes (Signed)
[email or web address]: Interval History: Patient is feeling somewhat better. She was not able to sleep last night. Still she is feeling weak. Her appetite is getting better.  Objective: Vital signs in last 24 hours: Temp:  [98.3 F (36.8 C)-99.1 F (37.3 C)] 98.9 F (37.2 C) (08/30 0547) Pulse Rate:  [76-116] 99 (08/30 0547) Resp:  [16-20] 16 (08/30 0547) BP: (83-119)/(40-56) 98/49 (08/30 0547) SpO2:  [92 %-100 %] 92 % (08/30 0547) Weight:  [85 kg (187 lb 6.3 oz)] 85 kg (187 lb 6.3 oz) (08/29 1430) Weight change:   Intake/Output from previous day: 08/29 0701 - 08/30 0700 In: 427 [P.O.:427] Out: 1500  Intake/Output this shift: No intake/output data recorded.  Generally patient is sleepy but arousable. Answers questions. Chest: Decreased breath sound bilaterally Heart exam revealed regular rate and rhythm Abdomen: Distended, nontender Extremities: She has 3+ edema  Lab Results:  Recent Labs  04/06/17 0604 04/07/17 0540  WBC 7.1 7.4  HGB 8.1* 8.2*  HCT 24.9* 25.4*  PLT 77* 85*   BMET:   Recent Labs  04/06/17 0604 04/07/17 0540  NA 138 136  K 3.5 3.8  CL 100* 96*  CO2 22 22  GLUCOSE 95 89  BUN 65* 75*  CREATININE 11.04* 11.43*  CALCIUM 6.2* 6.7*   No results for input(s): PTH in the last 72 hours. Iron Studies: No results for input(s): IRON, TIBC, TRANSFERRIN, FERRITIN in the last 72 hours.  Studies/Results: US Paracentesis  Result Date: 04/06/2017 INDICATION: Cirrhosis due to NASH, recurrent ascites EXAM: ULTRASOUND GUIDED DIAGNOSTIC AND THERAPEUTIC PARACENTESIS MEDICATIONS: None. COMPLICATIONS: None immediate. PROCEDURE: Procedure performed portably in the ICU. Procedure, benefits, and risks of procedure were discussed with patient. Written informed consent for procedure was obtained. Time out protocol followed. Adequate collection of ascites localized by ultrasound in RIGHT lower quadrant. Skin prepped and draped in usual sterile fashion. Skin and soft tissues  anesthetized with 10 mL of 1% lidocaine. 5 [place] Yueh catheter placed into peritoneal cavity. 9.2 L of yellow ascitic fluid aspirated by vacuum bottle suction. Procedure tolerated well by patient without immediate complication. FINDINGS: A total of approximately 9.2 L of ascitic fluid was removed. Samples were sent to the laboratory as requested by the clinical team. IMPRESSION: Successful ultrasound-guided paracentesis yielding 9.2 liters of peritoneal fluid. Electronically Signed   By: Lavonia Dana M.D.   On: 04/06/2017 12:39    I have reviewed the patient's current medications.  Assessment/Plan: Problem #1 end-stage renal disease: She is status post hemodialysis yesterday. Her potassium is normal. Presently she doesn't have any nausea or vomiting. Her appetite is getting better.. Problem #2 anasarca: Patient with liver cirrhosis. Uncontrolled salt and fluid intake and lack of dialysis . Patient still with ascites and significant edema. Were able to remove 1.5 L since yesterday mainly because of hypotension. Problem #3 anemia: Her hemoglobin remains low but stable. Presently she is on Epogen. Problem #4 noncompliance with her dialysis. This is a recurrent problem. The last couple of months she has stopped coming to the dialysis unit. She goes to different hospitals to get her dialysis when she becomes sick. Problem #5 bone and mineral disorder: Her calcium remains low but progressively improving. Patient is being dialyzed with 3.5 calcium as. Problem #6 history of depression Problem #7 hypotension: Recurrent problem. Patient on Midodrine as an outpatient. Plan: 1] We'll dialyze patient tomorrow and try to remove about 3 L if her blood pressure tolerates. 2] will check a renal panel and CBC in the morning 3]  change Midodrine to 10 mg by mouth twice a day   LOS: 4 days   Luana Tatro S 04/08/2017,8:49 AM

## 2017-04-08 NOTE — Progress Notes (Signed)
PT Cancellation Note  Patient Details Name: TYGER OKA MRN: [id number] DOB: 11-Nov-1975   Cancelled Treatment:    Reason Eval/Treat Not Completed: Pain limiting ability to participate.  Will check later to see how pt is doing, reattempt evaluation.   Ramond Dial 04/08/2017, 9:51 AM  9:52 AM, 04/08/17 Mee Hives, PT, MS Physical Therapist - [place] [phone] (Office)

## 2017-04-08 NOTE — Progress Notes (Signed)
  PROGRESS NOTE  Wanda [name] IRS:[id number] DOB: 04-18-1976 DOA: 04/04/2017 PCP: [name], MD  Brief Narrative: 41 year old woman PMH NASH cirrhosis, end-stage renal disease on hemodialysis, persistent noncompliance with hemodialysis and medications who presented with generalized pain after a fall at home, progressive lower extremity edema. Reportedly no dialysis over one month. Admitted for massive volume overload.  Assessment/Plan End-stage renal disease on hemodialysis with massive volume overload/anasarca, associated hypocalcemia secondary to nonadherence to dialysis. Noncompliant with hemodialysis for 1 month. -Still has massive volume overload. Hemodialysis planned again 8/31.  NASH cirrhosis with ascites, acute hepatic encephalopathy, thrombocytopenia, esophageal varices. Status post large volume paracentesis 8/28. -Appears asymptomatic at this point. Continue lactulose.  Chronic hypotension. -Remains asymptomatic. Continue midodrine.  Anemia of end-stage renal disease. Status post 1 unit PRBC transfusion 8/27.  Fall at home. Reported right hip pain. X-rays negative. Physical therapy evaluation. -Discussed further evaluation with patient including further imaging but she declines this. Of note the patient had this same pain on last hospitalization which she reports spontaneously improved at home. Suspect secondary to edema and muscle spasm. She reports Robaxin helps.  DVT prophylaxis: SCDs Code Status: full Family Communication: none Disposition Plan: home    Murray Hodgkins, MD  [facility] Direct contact: [phone] --Via [place]  --[email or web address]; password [email or web address]  7PM-7AM contact night coverage as above 04/08/2017, 3:32 PM  LOS: 4 days   Consultants:  Nephrology  Palliative medicine  Procedures:  Transfusion one unit PRBC 8/27  Large-volume paracentesis 8/28  Antimicrobials:    Interval history/Subjective: Feels better today. Still  has significant edema. Still has some right groin pain.  Objective: Vitals: Afebrile, 98.7, 15, 96, 87/79, 95% on room air  Exam:     Constitutional. Appears calm, comfortable.  Respiratory. Clear to auscultation bilaterally. No wheezes, rales or rhonchi. Normal respiratory effort.  Cardiovascular. Regular rate and rhythm. No murmur, rub or gallop. 4+ bilateral lower extremity edema extending up to the abdomen with significant abdominal wall edema.  Right side of abdomen the pills are unremarkable. Hip appears unremarkable. Able to move both legs. Does report pain with movement of right leg, has pain with movement of right pannus.  Psychiatric. Odd affect. Cannot assess mood.   I have personally reviewed the following:   Labs:  No labs today  Imaging studies:     Scheduled Meds: . [START ON 04/09/2017] epoetin (EPOGEN/PROCRIT) injection  10,000 Units Intravenous Q M,W,F-HD  . lactulose  10-20 g Oral TID  . lidocaine  10 mL Mouth/Throat TID AC & HS  . midodrine  10 mg Oral BID WC  . pantoprazole  40 mg Oral Q0600  . sevelamer carbonate  1,600 mg Oral TID WC  . sucralfate  1 g Oral TID WC & HS  . triamcinolone cream   Topical BID   Continuous Infusions: . sodium chloride    . sodium chloride      Principal Problem:   ESRD (end stage renal disease) ([facility]) Active Problems:   Anasarca   Thrombocytopenia ([facility])   Anemia of chronic disease   Pressure injury of skin   Liver cirrhosis secondary to NASH (nonalcoholic steatohepatitis) ([place])   Hepatic encephalopathy ([place])   Uremia   Fall at home, initial encounter   Encounter for hospice care discussion   LOS: 4 days

## 2017-04-09 LAB — RENAL FUNCTION PANEL
ANION GAP: 13 (ref 5–15)
Albumin: 2.3 g/dL — ABNORMAL LOW (ref 3.5–5.0)
BUN: 58 mg/dL — ABNORMAL HIGH (ref 6–20)
CHLORIDE: 99 mmol/L — AB (ref 101–111)
CO2: 22 mmol/L (ref 22–32)
CREATININE: 8.23 mg/dL — AB (ref 0.44–1.00)
Calcium: 7.6 mg/dL — ABNORMAL LOW (ref 8.9–10.3)
GFR calc non Af Amer: 5 mL/min — ABNORMAL LOW (ref >60)
GFR, EST AFRICAN AMERICAN: 6 mL/min — AB (ref >60)
Glucose, Bld: 101 mg/dL — ABNORMAL HIGH (ref 65–99)
Phosphorus: 6.1 mg/dL — ABNORMAL HIGH (ref 2.5–4.6)
Potassium: 3.5 mmol/L (ref 3.5–5.1)
Sodium: 134 mmol/L — ABNORMAL LOW (ref 135–145)

## 2017-04-09 LAB — CBC
HCT: 26.3 % — ABNORMAL LOW (ref 36.0–46.0)
Hemoglobin: 8.5 g/dL — ABNORMAL LOW (ref 12.0–15.0)
MCH: 31.1 pg (ref 26.0–34.0)
MCHC: 32.3 g/dL (ref 30.0–36.0)
MCV: 96.3 fL (ref 78.0–100.0)
PLATELETS: 79 10*3/uL — AB (ref 150–400)
RBC: 2.73 MIL/uL — AB (ref 3.87–5.11)
RDW: 16.6 % — ABNORMAL HIGH (ref 11.5–15.5)
WBC: 6.5 10*3/uL (ref 4.0–10.5)

## 2017-04-09 MED ORDER — ALBUMIN HUMAN 25 % IV SOLN
25.0000 g | Freq: Once | INTRAVENOUS | Status: AC
  Administered 2017-04-09: 25 g via INTRAVENOUS
  Filled 2017-04-09: qty 100

## 2017-04-09 MED ORDER — EPOETIN ALFA 10000 UNIT/ML IJ SOLN
INTRAMUSCULAR | Status: AC
  Administered 2017-04-09: 10000 [IU] via INTRAVENOUS
  Filled 2017-04-09: qty 1

## 2017-04-09 MED ORDER — ALBUMIN HUMAN 25 % IV SOLN
INTRAVENOUS | Status: AC
  Administered 2017-04-09: 25 g via INTRAVENOUS
  Filled 2017-04-09: qty 100

## 2017-04-09 NOTE — Procedures (Signed)
    HEMODIALYSIS TREATMENT NOTE:  3 hour HD session completed via right upper arm AVF.  Goal NOT met: Pt ended session 1 hour early.  "I just don't want to run any longer.  I normally only run 3 hours."  Was hemodynamically stable throughout this session after Albumin 25g given. Net UF 2.3 liters.  All blood was returned and hemostasis was achieved within 10 minutes. Report given to Sharyn Blitz, RN.  Rockwell Alexandria, RN, CDN

## 2017-04-09 NOTE — Progress Notes (Signed)
h [email or web address]: Interval History: Patient is complaints of  Not feeling well. She has pain all over  the body. She has stuffiness  nose  Objective: Vital signs in last 24 hours: Temp:  [98.5 F (36.9 C)-98.7 F (37.1 C)] 98.5 F (36.9 C) (08/31 0505) Pulse Rate:  [67-96] 67 (08/31 0505) Resp:  [15-16] 16 (08/31 0505) BP: (87-118)/(39-60) 101/51 (08/31 0505) SpO2:  [93 %-99 %] 99 % (08/31 0505) Weight:  [79.5 kg (175 lb 4.3 oz)] 79.5 kg (175 lb 4.3 oz) (08/31 0505) Weight change: -5.5 kg (-12 lb 2 oz)  Intake/Output from previous day: 08/30 0701 - 08/31 0700 In: 472 [P.O.:472] Out: -  Intake/Output this shift: Total I/O In: 240 [P.O.:240] Out: -   Generally patient is sleepy but arousable. Answers questions. Chest: Decreased breath sound bilaterally Heart exam revealed regular rate and rhythm Abdomen: Distended, nontender Extremities: She has 3+ edema  Lab Results:  Recent Labs  04/07/17 0540 04/09/17 0616  WBC 7.4 6.5  HGB 8.2* 8.5*  HCT 25.4* 26.3*  PLT 85* 79*   BMET:   Recent Labs  04/07/17 0540 04/09/17 0620  NA 136 134*  K 3.8 3.5  CL 96* 99*  CO2 22 22  GLUCOSE 89 101*  BUN 75* 58*  CREATININE 11.43* 8.23*  CALCIUM 6.7* 7.6*   No results for input([name]): PTH in the last 72 hours. Iron Studies: No results for input([name]): IRON, TIBC, TRANSFERRIN, FERRITIN in the last 72 hours.  Studies/Results: No results found.  I have reviewed the patient'[name] current medications.  Assessment/Plan: Problem #1 end-stage renal disease: She is status post hemodialysis  On Wednesday. She is due for dialysis today. Problem #2 anasarca: Patient with liver cirrhosis. Still she is with significant anasarca Problem #3 anemia: Her hemoglobin remains low but improving slowly.. Presently she is on Epogen. Problem #4 noncompliance with her dialysis.  Problem #5 bone and mineral disorder: Her calcium remains is in range but her phosphorus is high and above our target  goal. Presently she is on binder Problem #6 history of depression Problem #7 hypotension: Recurrent problem. Patient on Midodrine her blood pressure is low normal. Fluid removal has been difficult because of hypotension Plan: 1] We'll dialyze patient today and try to remove about 3 L if her blood pressure tolerates. 2] will check a renal panel and CBC in the morning 3] We will use Albumin on dialysis   LOS: 5 days   Wanda [name] 04/09/2017,10:36 AM

## 2017-04-09 NOTE — Progress Notes (Addendum)
PROGRESS NOTE  Wanda [name] BWG:[id number] DOB: 08-01-1976 DOA: 04/04/2017 PCP: [name], MD  Brief Narrative: 41 year old woman PMH NASH cirrhosis, end-stage renal disease on hemodialysis, persistent noncompliance with hemodialysis and medications who presented with generalized pain after a fall at home, progressive lower extremity edema. Reportedly no dialysis over one month. Admitted for massive volume overload.  Assessment/Plan End-stage renal disease on hemodialysis with massive volume overload/anasarca, associated hypocalcemia secondary to nonadherence to dialysis. Noncompliant with hemodialysis for 1 month. -Massive volume overload persists. Hemodialysis plan today. Suspect has more than 30 pounds of fluid.  NASH cirrhosis with ascites, acute hepatic encephalopathy, thrombocytopenia, esophageal varices. Status post large volume paracentesis 8/28. -Appears asymptomatic. Continue lactulose, however the patient consistently refuses this. Check ammonia level in the morning.  Chronic hypotension. -Asymptomatic. Long-standing problem. Continue midodrine.  Anemia of end-stage renal disease. Stable. Status post 1 unit PRBC transfusion 8/27.  Fall at home. Reported right hip pain. X-rays negative. Physical therapy evaluation. -Patient declines further evaluation including imaging. She did participate with physical therapy today. Skilled nursing facility has been recommended.  DVT prophylaxis: SCDs Code Status: full Family Communication: none Disposition Plan: SNF?   Murray Hodgkins, MD  [facility] Direct contact: [phone] --Via amion app OR  --[email or web address]; password [email or web address]  7PM-7AM contact night coverage as above 04/09/2017, 3:22 PM  LOS: 5 days   Consultants:  Nephrology  Palliative medicine  Procedures:  Transfusion one unit PRBC 8/27  Large-volume paracentesis 8/28  Antimicrobials:    Interval history/Subjective: She continues to complain  of pain. Symptoms are very vague, she does localize the right side of the abdomen, skin and hip area but also complains of pain in the left leg and left ankle. She is vague and evasive when asked questions.  Objective: Vitals: 98.4, 16, 90, 117/57, 99% on room air  Exam:     Constitutional. Appears calm, comfortable.  Psychiatric. Alert, oriented to self, location, month, year, reason for hospitalization, kidney disease. Appears depressed. Affect is odd.  Respiratory. Clear to auscultation bilaterally. No wheezes, rales or rhonchi. Normal respiratory effort.  Cardiovascular. Regular rate and rhythm. No rub or gallop. 2/6 systolic murmur. 4+ bilateral lower extreme edema up to the abdomen.  Abdominal wall edema noted. Moves both legs to command but is limited by pain.   I have personally reviewed the following:   Labs:  Basic metabolic panel consistent with end-stage renal disease. Potassium within normal limits.  Hemoglobin stable 8.5. WBC within normal limits. Platelets 79.  Imaging studies:     Scheduled Meds: . epoetin (EPOGEN/PROCRIT) injection  10,000 Units Intravenous Q M,W,F-HD  . lactulose  10-20 g Oral TID  . lidocaine  10 mL Mouth/Throat TID AC & HS  . midodrine  10 mg Oral BID WC  . pantoprazole  40 mg Oral Q0600  . sevelamer carbonate  1,600 mg Oral TID WC  . sucralfate  1 g Oral TID WC & HS  . triamcinolone cream   Topical BID   Continuous Infusions: . sodium chloride    . sodium chloride      Principal Problem:   ESRD (end stage renal disease) (HCC) Active Problems:   Anasarca   Thrombocytopenia (HCC)   Anemia of chronic disease   Pressure injury of skin   Liver cirrhosis secondary to NASH (nonalcoholic steatohepatitis) ([place])   Hepatic encephalopathy ([place])   Uremia   Fall at home, initial encounter   Encounter for hospice care discussion   LOS: 5 days

## 2017-04-09 NOTE — Evaluation (Signed)
Physical Therapy Evaluation Patient Details Name: Wanda [name] MRN: [id number] DOB: 05-Mar-1976 Today's Date: 04/09/2017   History of Present Illness  Wanda [name] is a 41 y.o. female with medical history significant of end-stage renal disease on hemodialysis, [name] cirrhosis, who is a long history of noncompliance with dialysis and meds. Patient is apparently not had dialysis in over one month. She reports progressive lower extremity edema, dyspnea on exertion. Last night, she reports walking without the help of her walker and lost her balance leading to a fall. She fell on her right side. Is unable to stand regularly. She called her significant other went to get help. She feels that she may have been laying on the floor for approximately 1 hour. She did not lose consciousness or hit her head. She's not had any fever or significant change in cough recently. No vomiting or diarrhea.    Clinical Impression  Patient required much encouragement to participate with therapy due c/o severe right hip pain.  Patient required much time to sit up at bedside with 2 person assist, requires constant verbal/tactile cueing for proper hand placement during bed mobility, tolerated standing with RW with Max assist up to 6-7 minutes while having dressing changed on bottom by RN and required 2 person max assist to put back to bed.  Patient will benefit from continued physical therapy in hospital and recommended venue below to increase strength, balance, endurance for safe ADLs and gait.    Follow Up Recommendations SNF;Supervision/Assistance - 24 hour    Equipment Recommendations  None recommended by PT    Recommendations for Other Services       Precautions / Restrictions Precautions Precautions: Fall Restrictions Weight Bearing Restrictions: No      Mobility  Bed Mobility Overal bed mobility: Needs Assistance Bed Mobility: Supine to Sit;Sit to Supine     Supine to sit: Max assist Sit to supine:  Max assist   General bed mobility comments: 2 person Max assist  Transfers Overall transfer level: Needs assistance Equipment used: Rolling walker (2 wheeled) Transfers: Sit to/from Stand              Ambulation/Gait Ambulation/Gait assistance: Max Theme park manager    Modified Rankin (Stroke Patients Only)       Balance Overall balance assessment: Needs assistance;History of Falls Sitting-balance support: Feet supported;Bilateral upper extremity supported Sitting balance-Leahy Scale: Fair     Standing balance support: Bilateral upper extremity supported Standing balance-Leahy Scale: Poor                               Pertinent Vitals/Pain Pain Assessment: 0-10 Pain Score: 10-Worst pain ever Pain Location: Right hip and side Pain Descriptors / Indicators: Crying;Constant;Nagging;Grimacing;Sharp;Pressure Pain Intervention(s): Limited activity within patient's tolerance;Monitored during session    [place] expects to be discharged to:: Private residence Living Arrangements: Spouse/significant other Available Help at Discharge: Family Type of Home: House Home Access: Level entry     Home Layout: One Thayer: Bedside commode;Wheelchair - [employer] - 2 wheels      Prior Function Level of Independence: Needs assistance   Gait / Transfers Assistance Needed: Assisted gait with FWW, household distances  ADL's / Homemaking Assistance Needed: Assisted by significant other        Hand  Dominance        Extremity/Trunk Assessment   Upper Extremity Assessment Upper Extremity Assessment: Generalized weakness    Lower Extremity Assessment Lower Extremity Assessment: RLE deficits/detail;LLE deficits/detail RLE Deficits / Details: -3/5 LLE Deficits / Details: 3/5    Cervical / Trunk Assessment Cervical / Trunk Assessment: Kyphotic  Communication    Communication: No difficulties  Cognition Arousal/Alertness: Awake/alert Behavior During Therapy: WFL for tasks assessed/performed Overall Cognitive Status: Within Functional Limits for tasks assessed                                        General Comments      Exercises General Exercises - Lower Extremity Ankle Circles/Pumps: AROM;Both;Seated;10 reps Toe Raises: Seated;AROM;Both;10 reps   Assessment/Plan    PT Assessment Patient needs continued PT services  PT Problem List Decreased strength;Decreased activity tolerance;Decreased balance;Decreased range of motion;Decreased mobility;Decreased safety awareness       PT Treatment Interventions Gait training;Functional mobility training;Therapeutic activities;Therapeutic exercise;Patient/family education    PT Goals (Current goals can be found in the Care Plan section)  Acute Rehab PT Goals Patient Stated Goal: Return home PT Goal Formulation: With patient/family Time For Goal Achievement: 04/23/17 Potential to Achieve Goals: Fair    Frequency Min 3X/week   Barriers to discharge        Co-evaluation               AM-PAC PT "6 Clicks" Daily Activity  Outcome Measure Difficulty turning over in bed (including adjusting bedclothes, sheets and blankets)?: Unable Difficulty moving from lying on back to sitting on the side of the bed? : Unable Difficulty sitting down on and standing up from a chair with arms (e.g., wheelchair, bedside commode, etc,.)?: Unable Help needed moving to and from a bed to chair (including a wheelchair)?: A Lot Help needed walking in hospital room?: Total Help needed climbing 3-5 steps with a railing? : Total 6 Click Score: 7    End of Session   Activity Tolerance: Patient limited by pain Patient left: in bed;with call bell/phone within reach;with family/visitor present Nurse Communication: Mobility status PT Visit Diagnosis: Unsteadiness on feet (R26.81);Other abnormalities  of gait and mobility (R26.89);Muscle weakness (generalized) (M62.81)    Time: 8938-1017 PT Time Calculation (min) (ACUTE ONLY): 45 min   Charges:   PT Evaluation $PT Eval Moderate Complexity: 1 Mod PT Treatments $Therapeutic Activity: 23-37 mins   PT G Codes:        3:00 PM, 04/22/17 Lonell Grandchild, MPT Physical Therapist with [facility] [phone] office [phone] mobile phone

## 2017-04-09 NOTE — Care Management Note (Signed)
Case Management Note  Patient Details  Name: Wanda [name] MRN: [id number] Date of Birth: 03-14-1976  Subjective/Objective:                  Admitted with ESRD and encephalopathy. Pt from home with spouse. She requires assistance at baseline. She was DC'd last admission with [facility] through [facility]. She reports they were coming but singed off because insurance stopped paying'. Pt states she does not want HH again. PT recommends SNF and pt says she may be agreeable but wants to discuss further with family.   Action/Plan: CSW aware of PT recommendation. No CM needs anticiapted as pt is not interested in [facility] if she goes home.  Expected Discharge Date:      04/10/2017            Expected Discharge Plan:   (SNF vs Home with self care)  In-House Referral:  Clinical Social Work  Discharge planning Services  CM Consult  Post Acute Care Choice:  NA Choice offered to:  NA  Status of Service:  In process, will continue to follow  Sherald Barge, RN 04/09/2017, 3:19 PM

## 2017-04-09 NOTE — Clinical Social Work Note (Addendum)
LCSW attempted to engage patient about SNF recommendation. Patient was not interested in this discussion.  She advised that she was "not thinking." LCSW provided SNF list. Patient indicated that she would review the list and think about it as well as discuss it with her family. LCSW advised that if she decided to go to SNF, to notify her nurse who could notify the weekend social worker of her decision for fax out. Patient has a history of noncompliance with all aspects of treatment.      [name], Clydene Pugh, LCSW

## 2017-04-09 NOTE — Progress Notes (Signed)
Patient c/o pain unrelieved by PO medication. Patient requesting IV pain medication. MD paged and ne new orders given at this time.

## 2017-04-09 NOTE — Care Management Important Message (Signed)
Important Message  Patient Details  Name: Wanda [name] MRN: [id number] Date of Birth: November 16, 1975   Medicare Important Message Given:  Yes    Sherald Barge, RN 04/09/2017, 3:21 PM

## 2017-04-10 DIAGNOSIS — R7989 Other specified abnormal findings of blood chemistry: Secondary | ICD-10-CM

## 2017-04-10 LAB — AMMONIA: AMMONIA: 116 umol/L — AB (ref 9–35)

## 2017-04-10 NOTE — Progress Notes (Signed)
PROGRESS NOTE  Wanda [name] UEA:[id number] DOB: 05-12-1976 DOA: 04/04/2017 PCP: [name], MD  Brief Narrative: 41 year old woman PMH NASH cirrhosis, end-stage renal disease on hemodialysis, persistent noncompliance with hemodialysis and medications who presented with generalized pain after a fall at home, progressive lower extremity edema. Reportedly no dialysis over one month. Admitted for massive volume overload.  Assessment/Plan End-stage renal disease on hemodialysis with massive volume overload/anasarca, associated hypocalcemia secondary to nonadherence to dialysis. Noncompliant with hemodialysis for 1 month. -Massive volume overload persists. Tolerating hemodialysis. Not fully compliant with treatment.  NASH cirrhosis with ascites, acute hepatic encephalopathy, thrombocytopenia, esophageal varices. Status post large volume paracentesis 8/28. -Continues to refuse lactulose, ammonia level is elevated, however she is alert and oriented. I stressed the importance and rationale for taking lactulose.  Chronic hypotension. -Remains asymptomatic, blood pressure is currently better. Continue midodrine.  Anemia of end-stage renal disease. Stable. Status post 1 unit PRBC transfusion 8/27.  Fall at home. Reported right hip pain. X-rays negative. Physical therapy evaluation. -She is participate in somewhat with physical therapy. Skilled nursing facility has been recommended and the patient is considering this. I strongly recommend at this.  Overall improving with hemodialysis. I stressed the need for adherence to lactulose. Strongly recommend skilled nursing facility, could transfer 9/30 if continues to improve.  DVT prophylaxis: SCDs Code Status: full Family Communication: none Disposition Plan: SNF?   Murray Hodgkins, MD  [facility] Direct contact: [phone] --Via amion app OR  --[email or web address]; password [email or web address]  7PM-7AM contact night coverage as above 04/10/2017,  11:45 AM  LOS: 6 days   Consultants:  Nephrology  Palliative medicine  Procedures:  Transfusion one unit PRBC 8/27  Large-volume paracentesis 8/28  Antimicrobials:    Interval history/Subjective: She was able to get up with physical therapy yesterday. She has been refusing medications including lactulose. She refuses it because it necessitates more movement which is painful. She cut her dialysis session early by 1 hour yesterday. She continues to have pain in her right side.  Objective: Vitals: Afebrile, 98.1, 20, 84, 104/51, 99% on room air  Exam:     Constitutional. Appears calm, comfortable.  Psychiatric. Appears depressed. Odd affect. Oriented to self, location, month, year, reasons for hospitalization  Cardiovascular. Regular rate and rhythm. No murmur, rub or gallop. No change in 4+ bilateral lower extremity edema up to the abdomen.  Respiratory. Clear to auscultation bilaterally. No wheezes, rales or rhonchi. Normal respiratory effort.  Musculoskeletal. Moves both legs to command. Improved movement of the right leg, able to flex knee approximately 30 and further with assistance without apparent pain in the right side her hip today.   I have personally reviewed the following:   Labs:  Ammonia level 116  Scheduled Meds: . epoetin (EPOGEN/PROCRIT) injection  10,000 Units Intravenous Q M,W,F-HD  . lactulose  10-20 g Oral TID  . lidocaine  10 mL Mouth/Throat TID AC & HS  . midodrine  10 mg Oral BID WC  . pantoprazole  40 mg Oral Q0600  . sevelamer carbonate  1,600 mg Oral TID WC  . sucralfate  1 g Oral TID WC & HS  . triamcinolone cream   Topical BID   Continuous Infusions: . sodium chloride    . sodium chloride      Principal Problem:   ESRD (end stage renal disease) (HCC) Active Problems:   Anasarca   Thrombocytopenia (HCC)   Anemia of chronic disease   Pressure injury of skin   Liver cirrhosis secondary to NASH (  nonalcoholic steatohepatitis)  ([place])   Hepatic encephalopathy ([place])   Uremia   Fall at home, initial encounter   Encounter for hospice care discussion   LOS: 6 days

## 2017-04-10 NOTE — Progress Notes (Signed)
h [email or web address]: Interval History: History she has some stuffiness and was on one side. Her appetite is poor but she is forcing herself to eat. She didn't have any nausea or vomiting.   Objective: Vital signs a in last 24 hours: Temp:  [98.1 F (36.7 C)-98.4 F (36.9 C)] 98.1 F (36.7 C) (09/01 0539) Pulse Rate:  [81-99] 84 (09/01 0539) Resp:  [16-20] 20 (09/01 0539) BP: (98-118)/(43-57) 104/51 (09/01 0539) SpO2:  [99 %-100 %] 99 % (09/01 0539) Weight:  [77.5 kg (170 lb 14.4 oz)-80 kg (176 lb 5.9 oz)] 77.5 kg (170 lb 14.4 oz) (09/01 0539) Weight change: 0.5 kg (1 lb 1.6 oz)  Intake/Output from previous day: 08/31 0701 - 09/01 0700 In: 400 [P.O.:300; IV Piggyback:100] Out: 2374  Intake/Output this shift: No intake/output data recorded.  Generally patient is sleepy but arousable. Answers questions. Chest: Decreased breath sound bilaterally Heart exam revealed regular rate and rhythm Abdomen: Distended, nontender Extremities: She has 3+ edema  Lab Results:  Recent Labs  04/09/17 0616  WBC 6.5  HGB 8.5*  HCT 26.3*  PLT 79*   BMET:   Recent Labs  04/09/17 0620  NA 134*  K 3.5  CL 99*  CO2 22  GLUCOSE 101*  BUN 58*  CREATININE 8.23*  CALCIUM 7.6*   No results for input(s): PTH in the last 72 hours. Iron Studies: No results for input(s): IRON, TIBC, TRANSFERRIN, FERRITIN in the last 72 hours.  Studies/Results: No results found.  I have reviewed the patient's current medications.  Assessment/Plan: Problem #1 end-stage renal disease: She is status post hemodialysis Yesterday for 3 hours. Patient refused to complete her treatment and cut her time by 1 hour. Problem #2 anasarca: Patient with liver cirrhosis. Still she is with significant anasarca . We had about 2.3 L of ultrafiltration. We'll use albumin.  Problem #3 anemia: Her hemoglobin remains low .[name] Presently she is on Epogen. Problem #4 noncompliance with her dialysis.  Problem #5 bone and mineral  disorder: Her calcium remains is in range but her phosphorus is high and above our target goal. Presently she is on binder Problem #6 history of depression Problem #7 hypotension: Recurrent problem. Patient on Midodrine  Plan: 1] Patient does not require dialysis today. Her next dialysis will be on Monday 2] will check a renal panel and CBC in the morning    LOS: 6 days   Palmer Shorey S 04/10/2017,8:57 AM

## 2017-04-11 LAB — CBC
HEMATOCRIT: 27.4 % — AB (ref 36.0–46.0)
HEMOGLOBIN: 8.5 g/dL — AB (ref 12.0–15.0)
MCH: 30 pg (ref 26.0–34.0)
MCHC: 31 g/dL (ref 30.0–36.0)
MCV: 96.8 fL (ref 78.0–100.0)
Platelets: 96 10*3/uL — ABNORMAL LOW (ref 150–400)
RBC: 2.83 MIL/uL — AB (ref 3.87–5.11)
RDW: 16.8 % — AB (ref 11.5–15.5)
WBC: 8.1 10*3/uL (ref 4.0–10.5)

## 2017-04-11 LAB — RENAL FUNCTION PANEL
ALBUMIN: 2.7 g/dL — AB (ref 3.5–5.0)
Anion gap: 9 (ref 5–15)
BUN: 52 mg/dL — AB (ref 6–20)
CO2: 28 mmol/L (ref 22–32)
Calcium: 8.3 mg/dL — ABNORMAL LOW (ref 8.9–10.3)
Chloride: 96 mmol/L — ABNORMAL LOW (ref 101–111)
Creatinine, Ser: 7.32 mg/dL — ABNORMAL HIGH (ref 0.44–1.00)
GFR calc Af Amer: 7 mL/min — ABNORMAL LOW (ref >60)
GFR, EST NON AFRICAN AMERICAN: 6 mL/min — AB (ref >60)
Glucose, Bld: 96 mg/dL (ref 65–99)
PHOSPHORUS: 5.8 mg/dL — AB (ref 2.5–4.6)
POTASSIUM: 3.9 mmol/L (ref 3.5–5.1)
Sodium: 133 mmol/L — ABNORMAL LOW (ref 135–145)

## 2017-04-11 LAB — CULTURE, BODY FLUID-BOTTLE

## 2017-04-11 LAB — CULTURE, BODY FLUID W GRAM STAIN -BOTTLE: Culture: NO GROWTH

## 2017-04-11 NOTE — Progress Notes (Signed)
Pt refusing several medications. Pt refusing Q2H turns, c/o back pain and sacral pain. Pt c/o hip and leg pain but refusing to elevate legs with bed or pillows. Pt refuses to decrease HOB to less than approximately 45 degrees.

## 2017-04-11 NOTE — Progress Notes (Addendum)
PROGRESS NOTE  Wanda [name] HGD:[id number] DOB: [birth year] DOA: 04/04/2017 PCP: [name], MD  Brief Narrative: 41 year old woman PMH NASH cirrhosis, end-stage renal disease on hemodialysis, persistent noncompliance with hemodialysis and medications who presented with generalized pain after a fall at home, progressive lower extremity edema. Reportedly no dialysis over one month. Admitted for massive volume overload.  Assessment/Plan End-stage renal disease on hemodialysis with massive volume overload/anasarca, associated hypocalcemia secondary to nonadherence to dialysis. Noncompliant with hemodialysis for 1 month. -Massive volume overload without significant change. Tolerating hemodialysis. Remains noncompliant with aspects of treatment.  NASH cirrhosis with ascites, acute hepatic encephalopathy, thrombocytopenia, esophageal varices. Status post large volume paracentesis 8/28. -Refuses lactulose as she does not want to be turned in bed. Remains alert and conversant. Follows commands. Again I stressed the importance of taking lactulose but again she refuses.  -Thrombocytopenia stable.  Chronic hypotension. -Currently normotensive. Continue midodrine.  Anemia of end-stage renal disease. Stable. Status post 1 unit PRBC transfusion 8/27.  Fall at home. Reported right hip pain. X-rays negative. Physical therapy evaluation. -She has been able to participate with physical therapy and skilled nursing facility has been recommended. However she has noncompliant with movement in bed. She does not want any further evaluation.  Condition without significant change. Pain is likely multifactorial including recent fall, anasarca, immobility. She refuses any further evaluation and given examination I doubt any bony pathology. X-rays were negative. At this point, plan for hemodialysis 9/3 and would consider transfer to skilled nursing facility in the next 48 hours. She is considering skilled  nursing facility for rehabilitation. Very difficult case. Patient noncompliant but clearly alert and understands her condition.  DVT prophylaxis: low plts. She has refused heparin in the past. Massive BLE edema precludes SCDs. Code Status: full Family Communication: none Disposition Plan: SNF?   Murray Hodgkins, MD  [facility] Direct contact: [phone] --Via amion app OR  --[email or web address]; password [email or web address]  7PM-7AM contact night coverage as above 04/11/2017, 12:36 PM  LOS: 7 days   Consultants:  Nephrology  Palliative medicine  Procedures:  Transfusion one unit PRBC 8/27  Large-volume paracentesis 8/28  Antimicrobials:    Interval history/Subjective: Continues to refuse lactulose. Refusing other medications as well as turning in bed. Refusing to elevate legs. Refuses to decrease head of bed.  She feels about "the same" today. Tolerating diet. Breathing okay. No nausea or vomiting. Continues to have pain especially in right side. She does not want any further workup.  Objective: Vitals: Remains afebrile. 98.4, 18, 85, 100/49, 100% on room air.  Exam:     Constitutional. Appears calm, comfortable.  Respiratory. Clear to auscultation bilaterally. No wheezes, rales or rhonchi. Normal respiratory effort.  Cardiovascular. Regular rate and rhythm. 3/6 systolic murmur. No rub or gallop. No change in anasarca, 4+ bilateral lower extremity and abdominal wall edema.  Abdomen. Distended, skin firm. Nontender. Abdominal wall edema noted.  Musculoskeletal. Moves both legs to command. Able to lift both heels off the bed, barely. Has pain in right hip area with active movement. No definite pain with passive movement.  Psychiatric. Appears depressed. Affect is odd.   I have personally reviewed the following:   Labs:  Basic metabolic panel consistent with end-stage renal disease. Potassium 3.9.  Hemoglobin stable, 8.5. Platelets improved, 96. WBC 8.1.  Scheduled  Meds: . epoetin (EPOGEN/PROCRIT) injection  10,000 Units Intravenous Q M,W,F-HD  . lactulose  10-20 g Oral TID  . lidocaine  10 mL Mouth/Throat TID AC & HS  . midodrine  10 mg Oral BID WC  . pantoprazole  40 mg Oral Q0600  . sevelamer carbonate  1,600 mg Oral TID WC  . sucralfate  1 g Oral TID WC & HS  . triamcinolone cream   Topical BID   Continuous Infusions: . sodium chloride    . sodium chloride      Principal Problem:   ESRD (end stage renal disease) (HCC) Active Problems:   Anasarca   Thrombocytopenia (HCC)   Anemia of chronic disease   Pressure injury of skin   Liver cirrhosis secondary to NASH (nonalcoholic steatohepatitis) ([place])   Hepatic encephalopathy ([place])   Uremia   Fall at home, initial encounter   Encounter for hospice care discussion   LOS: 7 days

## 2017-04-11 NOTE — Progress Notes (Signed)
h [email or web address]: Interval History: She feels about the same and claims she didn't see much change.  Objective: Vital signs a in last 24 hours: Temp:  [98.4 F (36.9 C)-98.6 F (37 C)] 98.4 F (36.9 C) (09/02 0502) Pulse Rate:  [70-85] 85 (09/02 0502) Resp:  [16-18] 18 (09/02 0502) BP: (94-100)/(41-49) 100/49 (09/02 0502) SpO2:  [100 %] 100 % (09/02 0502) Weight:  [78.1 kg (172 lb 3.2 oz)] 78.1 kg (172 lb 3.2 oz) (09/02 0502) Weight change: -1.891 kg (-4 lb 2.7 oz)  Intake/Output from previous day: 09/01 0701 - 09/02 0700 In: 720 [P.O.:720] Out: -  Intake/Output this shift: Total I/O In: 120 [P.O.:120] Out: -   Generally patient is sleepy but arousable. Answers questions. Chest: Decreased breath sound bilaterally Heart exam revealed regular rate and rhythm Abdomen: Distended, nontender Extremities: She has 3+ edema  Lab Results:  Recent Labs  04/09/17 0616 04/11/17 0557  WBC 6.5 8.1  HGB 8.5* 8.5*  HCT 26.3* 27.4*  PLT 79* PENDING   BMET:   Recent Labs  04/09/17 0620 04/11/17 0557  NA 134* 133*  K 3.5 3.9  CL 99* 96*  CO2 22 28  GLUCOSE 101* 96  BUN 58* 52*  CREATININE 8.23* 7.32*  CALCIUM 7.6* 8.3*   No results for input(s): PTH in the last 72 hours. Iron Studies: No results for input(s): IRON, TIBC, TRANSFERRIN, FERRITIN in the last 72 hours.  Studies/Results: No results found.  I have reviewed the patient's current medications.  Assessment/Plan: Problem #1 end-stage renal disease: She is status post hemodialysis On Friday. Her potassium is normal. Problem #2 anasarca: Patient with liver cirrhosis. Still she is with significant anasarca. Breathing is the same  Problem #3 anemia: Her hemoglobin remains low . she is on Epogen . her hemoglobin is stable.  Problem #4 noncompliance with her dialysis.  Problem #5 bone and mineral disorder: Her calcium remains is in range but above our target goal. Presently she is on binder. Her phosphorus  however is improving.  Problem #6 history of depression Problem #7 hypotension: Recurrent problem. Patient on Midodrine  Plan: 1] Patient does not require dialysis today.  2] will check a renal panel and CBC in the morning 3] will dialyze patient for 3-1/2 hours tomorrow and possibly try to remove 3 L. We'll use some albumin.     LOS: 7 days   Nasiir Monts S 04/11/2017,9:33 AM

## 2017-04-11 NOTE — Progress Notes (Signed)
Pt dressing changed BLE. Pt did not tolerate well. Pt unable to lift and assist. Petroleum dressings placed on blisters, pink foams to bilateral heels, and wrapped with kerlex

## 2017-04-12 LAB — RENAL FUNCTION PANEL
Albumin: 2.7 g/dL — ABNORMAL LOW (ref 3.5–5.0)
Anion gap: 17 — ABNORMAL HIGH (ref 5–15)
BUN: 68 mg/dL — ABNORMAL HIGH (ref 6–20)
CHLORIDE: 94 mmol/L — AB (ref 101–111)
CO2: 23 mmol/L (ref 22–32)
CREATININE: 8.74 mg/dL — AB (ref 0.44–1.00)
Calcium: 8.2 mg/dL — ABNORMAL LOW (ref 8.9–10.3)
GFR calc Af Amer: 6 mL/min — ABNORMAL LOW (ref >60)
GFR, EST NON AFRICAN AMERICAN: 5 mL/min — AB (ref >60)
Glucose, Bld: 99 mg/dL (ref 65–99)
PHOSPHORUS: 6.7 mg/dL — AB (ref 2.5–4.6)
POTASSIUM: 3.8 mmol/L (ref 3.5–5.1)
Sodium: 134 mmol/L — ABNORMAL LOW (ref 135–145)

## 2017-04-12 LAB — CBC
HEMATOCRIT: 30.5 % — AB (ref 36.0–46.0)
Hemoglobin: 9.8 g/dL — ABNORMAL LOW (ref 12.0–15.0)
MCH: 30.8 pg (ref 26.0–34.0)
MCHC: 32.1 g/dL (ref 30.0–36.0)
MCV: 95.9 fL (ref 78.0–100.0)
PLATELETS: 102 10*3/uL — AB (ref 150–400)
RBC: 3.18 MIL/uL — AB (ref 3.87–5.11)
RDW: 16.6 % — ABNORMAL HIGH (ref 11.5–15.5)
WBC: 6.6 10*3/uL (ref 4.0–10.5)

## 2017-04-12 MED ORDER — EPOETIN ALFA 10000 UNIT/ML IJ SOLN
INTRAMUSCULAR | Status: AC
  Administered 2017-04-12: 10000 [IU] via INTRAVENOUS
  Filled 2017-04-12: qty 1

## 2017-04-12 MED ORDER — SODIUM CHLORIDE 0.9 % IV SOLN: 100.0000 mL | INTRAVENOUS | Status: DC | PRN

## 2017-04-12 MED ORDER — ALBUMIN HUMAN 25 % IV SOLN
INTRAVENOUS | Status: AC
  Administered 2017-04-12: 25 g
  Filled 2017-04-12: qty 100

## 2017-04-12 NOTE — Clinical Social Work Note (Signed)
CSW followed up with pt regarding SNF. Pt states she has not had time to think about this yet and will notify CSW when decision is made.   Wanda [name], [place]

## 2017-04-12 NOTE — Progress Notes (Signed)
h [email or web address]: Interval History: Patient complains of still poor appetite. She doesn't have any nausea or vomiting. She didn't participate in physical therapy because of pain.  Objective: Vital signs a in last 24 hours: Temp:  [97.9 F (36.6 C)-98.6 F (37 C)] 97.9 F (36.6 C) (09/03 0955) Pulse Rate:  [85-100] 98 (09/03 1015) Resp:  [16-18] 16 (09/03 0955) BP: (96-118)/(40-59) 96/40 (09/03 1015) SpO2:  [98 %-100 %] 99 % (09/03 0955) Weight:  [78.8 kg (173 lb 11.6 oz)-78.9 kg (173 lb 15.1 oz)] 78.9 kg (173 lb 15.1 oz) (09/03 0955) Weight change: 0.691 kg (1 lb 8.4 oz)  Intake/Output from previous day: 09/02 0701 - 09/03 0700 In: 840 [P.O.:840] Out: -  Intake/Output this shift: No intake/output data recorded.  Generally patient is sleepy but arousable. Answers questions. Chest: Decreased breath sound bilaterally Heart exam revealed regular rate and rhythm Abdomen: Distended, nontender Extremities: She has 3+ edema  Lab Results:  Recent Labs  04/11/17 0557 04/12/17 0836  WBC 8.1 6.6  HGB 8.5* 9.8*  HCT 27.4* 30.5*  PLT 96* 102*   BMET:   Recent Labs  04/11/17 0557 04/12/17 0836  NA 133* 134*  K 3.9 3.8  CL 96* 94*  CO2 28 23  GLUCOSE 96 99  BUN 52* 68*  CREATININE 7.32* 8.74*  CALCIUM 8.3* 8.2*   No results for input(s): PTH in the last 72 hours. Iron Studies: No results for input(s): IRON, TIBC, TRANSFERRIN, FERRITIN in the last 72 hours.  Studies/Results: No results found.  I have reviewed the patient's current medications.  Assessment/Plan: Problem #1 end-stage renal disease: Presently patient is on dialysis. Still she complains of poor appetite but no nausea or vomiting. Problem #2 anasarca: Patient with liver cirrhosis. Still she is with significant anasarca. Patient presently on albumin and midodrine. We are attempting to remove about 3 and half liters 6 systolic blood pressure remains above 90. Problem #3 anemia: Her hemoglobin remains  below target goal but hemoglobin is improving. She is on Neupogen Problem #4 noncompliance with her dialysis.  Problem #5 bone and mineral disorder: Her calcium remains is in range but above her phosphorus is high and worsening. Patient at times doesn't take her binders were regular meds. Problem #6 history of depression Problem #7 hypotension: Recurrent problem. Patient on Midodrine  Plan: 1] will continue his dialysis 2] patient advised to be compliant with her medications.    LOS: 8 days   Ellagrace Yoshida S 04/12/2017,10:28 AM

## 2017-04-12 NOTE — Plan of Care (Signed)
Problem: Health Behavior/Discharge Planning: Goal: Ability to manage health-related needs will improve Outcome: Not Progressing Patient continues to refuse pertinent medications to treat medical conditions; refuses to increase mobility and turn/reposition to optimize blood flow to tissues and prevent skin breakdown

## 2017-04-12 NOTE — Progress Notes (Addendum)
PROGRESS NOTE  DEMETRIUS BARRELL RXY:[id number] DOB: February 23, 1976 DOA: 04/04/2017 PCP: [name], MD  Brief Narrative: 41 year old woman PMH NASH cirrhosis, end-stage renal disease on hemodialysis, persistent noncompliance with hemodialysis and medications who presented with generalized pain after a fall at home, progressive lower extremity edema. Reportedly no dialysis over one month. Admitted for massive volume overload.  Assessment/Plan End-stage renal disease on hemodialysis with massive volume overload/anasarca, associated hypocalcemia secondary to nonadherence to dialysis. Noncompliant with hemodialysis for 1 month. -No significant change in massive volume overload. Dialysis: Today was not met but 2 L were removed.  NASH cirrhosis with ascites, acute hepatic encephalopathy, thrombocytopenia, esophageal varices. Status post large volume paracentesis 8/28. -Remains noncompliant, refuses lactulose which I again discussed with her today. Despite this, she remains alert and conversant and aware. Thrombocytopenia has improved. Overall cirrhosis remains relatively stable.  Chronic hypotension. -Problematic during dialysis today. Asymptomatic. Continue midodrine.  Anemia of end-stage renal disease. Stable. Status post 1 unit PRBC transfusion 8/27.  Fall at home. Reported right hip pain. X-rays negative. Physical therapy evaluation. -Patient has declined any further evaluation. She was noncompliant with physical therapy today. SNF recommended.  Overall condition appears relatively stable. I discussed options with patient and strongly recommended skilled nursing facility for rehabilitation. She is evasive and reports she needs to talk to people before she can make that decision. She wants to see what these people think. We have been discussing this for several days now. I've asked her to discuss with those whose input she desires and will reevaluate tomorrow. Continue to recommend skilled  nursing facility placement.  DVT prophylaxis: low plts. She has refused heparin in the past. Massive BLE edema precludes SCDs. Code Status: full Family Communication: none Disposition Plan: SNF?   Murray Hodgkins, MD  [facility] Direct contact: [phone] --Via amion app OR  --[email or web address]; password [email or web address]  7PM-7AM contact night coverage as above 04/12/2017, 4:14 PM  LOS: 8 days   Consultants:  Nephrology  Palliative medicine  Procedures:  Transfusion one unit PRBC 8/27  Large-volume paracentesis 8/28  Antimicrobials:    Interval history/Subjective: She feels about the same today. Continues to have pain, most notably on her right side  Objective: Vitals: Afebrile. 98.6, 16, 100, 95/43.  Exam:     Constitutional. Appears calm, comfortable.  Psychiatric. Withdrawn. Appears depressed. Flat affect.  Respiratory. Clear to auscultation bilaterally. No wheezes, rales or rhonchi. Normal respiratory effort.  Cardiovascular. Regular rate and rhythm. No murmur, rub or gallop. 4+ bilateral lower extremity edema.  Abdominal wall, edematous  I have personally reviewed the following:   Labs:  Basic metabolic panel consistent with end-stage renal disease. Potassium within normal limits.  Hemoglobin improved, 9.8. Platelet count improved, 102.  Scheduled Meds: . epoetin (EPOGEN/PROCRIT) injection  10,000 Units Intravenous Q M,W,F-HD  . lactulose  10-20 g Oral TID  . lidocaine  10 mL Mouth/Throat TID AC & HS  . midodrine  10 mg Oral BID WC  . pantoprazole  40 mg Oral Q0600  . sevelamer carbonate  1,600 mg Oral TID WC  . sucralfate  1 g Oral TID WC & HS  . triamcinolone cream   Topical BID   Continuous Infusions: . sodium chloride    . sodium chloride    . sodium chloride    . sodium chloride      Principal Problem:   ESRD (end stage renal disease) (HCC) Active Problems:   Anasarca   Thrombocytopenia (HCC)   Anemia of chronic disease  Pressure  injury of skin   Liver cirrhosis secondary to NASH (nonalcoholic steatohepatitis) ([place])   Hepatic encephalopathy ([place])   Uremia   Fall at home, initial encounter   Encounter for hospice care discussion   LOS: 8 days    Time spent greater than 35 minutes, greater than 50% in counseling and coordination of care, discussing treatment options with the patient as well as care with nephrologist Dr. Lowanda Foster.

## 2017-04-12 NOTE — Clinical Social Work Note (Signed)
CSW attempted to discuss SNF with pt again this afternoon as plan is for pt to potentially d/c tomorrow. Pt refused to have discussion at this time.   Benay Pike, [place]

## 2017-04-12 NOTE — Progress Notes (Signed)
Physical Therapy Note  Patient Details  Name: Wanda [name] MRN: [id number] Date of Birth: [birth year] Today's Date: 04/12/2017    Pt refused adamantly stating she was ill and asked therapist to leave her room  Teena Irani, PTA/CLT [place], [place] 04/12/2017, 9:31 AM

## 2017-04-12 NOTE — Procedures (Signed)
    HEMODIALYSIS TREATMENT NOTE:  3.5 hour heparin-free dialysis completed via right upper arm AVF (16g/antegrade). Goal NOT met: unable to tolerate removal of 3L as ordered.  Ultrafiltration was interrupted x 60 min of treatment due to hypotension despite Albumin administration.  Net UF 2L. All blood was returned and hemostasis was achieved within 15 minutes. Report called to Russ Halo, RN.  Rockwell Alexandria, RN, CDN

## 2017-04-12 NOTE — Plan of Care (Signed)
Problem: Tissue Perfusion: Goal: Risk factors for ineffective tissue perfusion will decrease Outcome: Not Met (add Reason) Patient refuses SCDs and frequent repositioning.  Educated on importance of these interventions

## 2017-04-13 NOTE — Progress Notes (Signed)
Initial Nutrition Assessment   Pt meets criteria for severe MALNUTRITION in the context of acute hospitalization and chronic illnesses (cirrhosis with ascites) and ESRD  as evidenced by energy intake has been </= 50% for  >/= 5 days and she has moderate to severe fluid accumulation.   INTERVENTION:  Recommend liberalize diet to allow for pleasure eating if pt decides to transition to Hospice care   NUTRITION DIAGNOSIS:  Increased nutrient needs related to chronic illness (ESRD with HD) as evidenced by estimated needs, energy intake < or equal to 50% for > or equal to 5 days.   GOAL:    (Pt is considering transition to Hospice as an option- nutrition goal unclear pending her decision)  MONITOR:   PO intake, Labs, Weight trends, Skin  REASON FOR ASSESSMENT:   LOS    ASSESSMENT:  The patient is a 41 yo female who is well known to staff through her multiple admission. She is chronically non-compliant with her health care. According to chart review she missed a month of dialysis. She has hx of ESRD and needs HD for maintenance, also has cirrhosis (ascites) and was significantly above dry wt on admission. The patient is considering Hospice as an option.   Diet hx; pt says her appetite is "ok". According to chart she has been eating 25-50% of most meals. She had a hamburger for lunch. She denies difficulty staying within her recommended fluid goal 5 cups daily. She refuses Nepro and says she doesn't like the nutrition shakes.  Weight hx:  8/26- 90.7 kg  9/4- 78.2 kg 6/10 -63.2 kg   Nutrition-Focused physical exam findings are no fat depletion, mild to moderate muscle depletion upper body, and moderate edema.  Her energy intake has been </= 50% for  >/= 5 days and she has moderate to severe fluid accumulation.     Intake/Output Summary (Last 24 hours) at 04/13/17 1629 Last data filed at 04/13/17 1100  Gross per 24 hour  Intake              960 ml  Output                0 ml  Net               960 ml     Recent Labs Lab 04/09/17 0620 04/11/17 0557 04/12/17 0836  NA 134* 133* 134*  K 3.5 3.9 3.8  CL 99* 96* 94*  CO2 22 28 23   BUN 58* 52* 68*  CREATININE 8.23* 7.32* 8.74*  CALCIUM 7.6* 8.3* 8.2*  PHOS 6.1* 5.8* 6.7*  GLUCOSE 101* 96 99  Labs: BUN 68 and Cr 8.74, Phos 6.7   Meds: Renvela, lactulose, PPI, Carafate  Diet Order:  Diet renal with fluid restriction Fluid restriction: 1200 mL Fluid; Room service appropriate? Yes; Fluid consistency: Thin  Skin:   stage II on admission  Last BM:  9/3   Height:   Ht Readings from Last 1 Encounters:  04/04/17 5' (1.524 m)    Weight:   Wt Readings from Last 1 Encounters:  04/13/17 172 lb 6.4 oz (78.2 kg)    Ideal Body Weight:  45 kg  BMI:  Body mass index is 33.67 kg/m.  Estimated Nutritional Needs: Based on dry wt of 63 kg -June  Kcal:  2105-2205  Protein:  85-90 gr  Fluid:  1.2 liters daily  EDUCATION NEEDS:   Education needs no appropriate at this time  Colman Cater MS,RD,CSG,LDN Office: #[phone] Pager: #  [phone]  

## 2017-04-13 NOTE — Progress Notes (Signed)
PROGRESS NOTE  Wanda [name] JAS:[id number] DOB: 02/25/1976 DOA: 04/04/2017 PCP: Wanda Simmer, MD  Brief Narrative: 41 year old woman PMH NASH cirrhosis, end-stage renal disease on hemodialysis, persistent noncompliance with hemodialysis and medications who presented with generalized pain after a fall at home, progressive lower extremity edema. Reportedly no dialysis over one month. Admitted for massive volume overload. Patient seen by nephrology and palliative medicine consultation. She has undergone hemodialysis during this hospitalization with minimal improvement and massive volume overload. She has been somewhat noncompliant with dialysis, at times ending treatments early, other times hemodialysis has been limited by hypotension. Cirrhosis has remained relatively stable the patient remains noncompliant with medications. She continues to have pain, predominantly in her right leg. She refuses further evaluation or imaging. She was evaluated by physical therapy and skilled nursing facility rehabilitation was recommended. Since that evaluation she has refused further physical therapy. At this point the patient is unable to care for herself at home. She is considering hospice versus skilled nursing facility.  Assessment/Plan End-stage renal disease on hemodialysis with massive volume overload/anasarca, associated hypocalcemia secondary to nonadherence to dialysis. Noncompliant with hemodialysis for 1 month. -No significant change in massive volume overload.  NASH cirrhosis with ascites, acute hepatic encephalopathy, thrombocytopenia, esophageal varices. Status post large volume paracentesis 8/28. -Despite noncompliance with lactulose and hyperammonemia, the patient remains alert and oriented to person, location, month and year.  Chronic hypotension. -Asymptomatic. Continue midodrine.  Anemia of end-stage renal disease. Stable. Status post 1 unit PRBC transfusion 8/27.  Fall at home.  Reported right hip pain. X-rays negative. Physical therapy evaluation. -Pain seems to be inconsistent on examination. She continues to refuse any further evaluation with imaging.  Overall her condition is relatively stable. I discussed with her again options including skilled nursing facility or hospice. I do not feel home as an option. This point she is unable to sit up, transfer or walk by herself. She is vague and evasive when we discuss the next step reporting that "she needs to talk to people", but refuses to specify what people or when. If the patient will be compliant with dialysis and treatment recommendations, skilled nursing facility would be the best option for her. However given her history of noncompliance both during this hospitalization and previously, if she will not comply with dialysis, I would recommend hospice.  DVT prophylaxis: low plts. She has refused heparin in the past. Massive BLE edema precludes SCDs. Code Status: full Family Communication: none Disposition Plan: Pending   Wanda Hodgkins, MD  [facility] Direct contact: [phone] --Via amion app OR  --[email or web address]; password [email or web address]  7PM-7AM contact night coverage as above 04/13/2017, 4:21 PM  LOS: 9 days   Consultants:  Nephrology  Palliative medicine  Procedures:  Transfusion one unit PRBC 8/27  Large-volume paracentesis 8/28  Antimicrobials:    Interval history/Subjective: She feels "about the same". Continues to have pain especially on her right side. Some pain in her right side of the chest has been present for a week since her fall. Painful with palpation of the chest wall.  Objective: Vitals: Afebrile, 98.4, 14, 90, 99/45, 100% on room air  Exam:     Constitutional. Appears chronically ill. Nontoxic.  Psychiatric. Withdrawn. Poor eye contact. Appears depressed. Flat affect.  Cardiovascular. Regular rate and rhythm. 3/6 systolic murmur. No rub or gallop. No change in massive  bilateral lower extremity edema up to the abdominal wall.  Respiratory. Clear to auscultation bilaterally. No wheezes, rales or rhonchi. Normal respiratory effort.  Abdomen is  firm secondary to significant abdominal wall edema.  Able to lift both heels off the bed and able to bend right knee further today perhaps as much as 30. No apparent pain with active movement. Apparent mild pain with passive movement of the right leg.  I have personally reviewed the following:   Labs:  No laboratory studies today.  Scheduled Meds: . epoetin (EPOGEN/PROCRIT) injection  10,000 Units Intravenous Q M,W,F-HD  . lactulose  10-20 g Oral TID  . lidocaine  10 mL Mouth/Throat TID AC & HS  . midodrine  10 mg Oral BID WC  . pantoprazole  40 mg Oral Q0600  . sevelamer carbonate  1,600 mg Oral TID WC  . sucralfate  1 g Oral TID WC & HS  . triamcinolone cream   Topical BID   Continuous Infusions: . sodium chloride    . sodium chloride    . sodium chloride    . sodium chloride      Principal Problem:   ESRD (end stage renal disease) (HCC) Active Problems:   Anasarca   Thrombocytopenia (HCC)   Anemia of chronic disease   Pressure injury of skin   Liver cirrhosis secondary to NASH (nonalcoholic steatohepatitis) ([place])   Hepatic encephalopathy ([place])   Uremia   Fall at home, initial encounter   Encounter for hospice care discussion   LOS: 9 days    Time spent greater than 35 minutes, greater than 50% counseling and coordination of care, specifically discussion with palliative care Wanda [name], as well as case management and the patient.

## 2017-04-13 NOTE — Care Management Important Message (Signed)
Important Message  Patient Details  Name: ARALYNN BRAKE MRN: [id number] Date of Birth: [birth year]   Medicare Important Message Given:  Yes    Hina Gupta, Chauncey Reading, RN 04/13/2017, 7:43 AM

## 2017-04-13 NOTE — Clinical Social Work Note (Signed)
Patient indicates that she has nothing to say related to SNF. Patient indicated that she has "someone else" working on making decisions for her about what she should do at discharge. She stated that the person will look in to it and determine what is the best place as far as they are concerned and place her from home. Patient indicated that she did not need for LCSW to contact the person who is working on her behalf.   LCSW signing off.     Mikaiya Tramble, Clydene Pugh, LCSW

## 2017-04-13 NOTE — Progress Notes (Signed)
Pt PT Cancellation Note  Patient Details Name: Wanda [name] MRN: [id number] DOB: 11-22-1975   Cancelled Treatment:     Pt. Refused treatment.  Spoke to pt that she had refused therapy yesterday so it would be very beneficial to get up today but pt continued to refuse.  If pt refuses next visit we will discharge from therapy.   Rayetta Humphrey, [place] CLT [phone] 04/13/2017, 11:12 AM

## 2017-04-13 NOTE — Progress Notes (Signed)
h [email or web address]: Interval History: No new complaints.. Patient still feels weak.    Objective: Vital signs a in last 24 hours: Temp:  [97.9 F (36.6 C)-99.1 F (37.3 C)] 98.4 F (36.9 C) (09/04 0434) Pulse Rate:  [82-100] 90 (09/04 0434) Resp:  [14-16] 14 (09/04 0434) BP: (78-112)/(30-56) 99/45 (09/04 0434) SpO2:  [99 %-100 %] 100 % (09/04 0434) Weight:  [78.2 kg (172 lb 6.4 oz)-78.9 kg (173 lb 15.1 oz)] 78.2 kg (172 lb 6.4 oz) (09/04 0434) Weight change: 0.1 kg (3.5 oz)  Intake/Output from previous day: 09/03 0701 - 09/04 0700 In: 480 [P.O.:480] Out: 2089  Intake/Output this shift: No intake/output data recorded.  Generally patient is sleepy but arousable. Answers questions. Chest: Decreased breath sound bilaterally Heart exam revealed regular rate and rhythm Abdomen: Distended, nontender Extremities: She has 3+ edema  Lab Results:  Recent Labs  04/11/17 0557 04/12/17 0836  WBC 8.1 6.6  HGB 8.5* 9.8*  HCT 27.4* 30.5*  PLT 96* 102*   BMET:   Recent Labs  04/11/17 0557 04/12/17 0836  NA 133* 134*  K 3.9 3.8  CL 96* 94*  CO2 28 23  GLUCOSE 96 99  BUN 52* 68*  CREATININE 7.32* 8.74*  CALCIUM 8.3* 8.2*   No results for input(s): PTH in the last 72 hours. Iron Studies: No results for input(s): IRON, TIBC, TRANSFERRIN, FERRITIN in the last 72 hours.  Studies/Results: No results found.  I have reviewed the patient's current medications.  Assessment/Plan: Problem #1 end-stage renal disease: Patient is status post hemodialysis yesterday. Her potassium is normal. Still she has poor appetite but no nausea or vomiting.. Problem #2 anasarca: Patient with liver cirrhosis. Still she is with significant anasarca. where able to remove about 2 L. Unable to get more ultrafiltration because of hypotension. Problem #3 anemia: Her hemoglobin remains below target goal but hemoglobin is improving. She is on Epogen Problem #4 noncompliance with her dialysis.  Problem  #5 bone and mineral disorder: Her calcium remains is in range but above her phosphorus is high and worsening. Patient at time refuses to take any oral medications. Problem #6 history of depression Problem #7 hypotension: Recurrent problem. Patient on Midodrine  Plan: 1] patient doesn't require dialysis today.  2] patient advised to be compliant with her medications. 3] will dialyze patient tomorrow if she is not going to be discharged.     LOS: 9 days   Wanda [name] S 04/13/2017,8:37 AM

## 2017-04-13 NOTE — Care Management Note (Signed)
Case Management Note  Patient Details  Name: Wanda [name] MRN: [id number] Date of Birth: 1976-05-06  Status of Service:  In process, will continue to follow  If discussed at Long Length of Stay Meetings, dates discussed:  04/13/2017  Additional Comments:  Aila Terra, Chauncey Reading, RN 04/13/2017, 12:09 PM

## 2017-04-13 NOTE — Clinical Social Work Note (Signed)
Per request of palliative care nurse patient's referral was made to [facility].      Wanda [name], Clydene Pugh, LCSW

## 2017-04-13 NOTE — Progress Notes (Signed)
Daily Progress Note   Patient Name: Wanda [name]       Date: 04/13/2017 DOB: 1976-01-12  Age: 41 y.o. MRN#: [id number] Attending Physician: Wanda Cota, MD Primary Care Physician: Wanda Simmer, MD Admit Date: 04/04/2017  Reason for Consultation/Follow-up: Establishing goals of care, Inpatient hospice referral and Psychosocial/spiritual support  Subjective: call to significant other, Wanda [name]. Updated on Wanda [name]'s current health condition and needs. We plan for a meeting in Wanda [name]'s room at noon. Call to Wanda [name] at her place of business, [employer] in [place] at [phone]. I talk with Wanda [name] about Wanda [name]'s health concerns. She shares that [employer] trust people". She tells a story of being power of attorney for Wanda [name] who had advanced directives. Wanda [name] states that Wanda [name] was angry with her for many years because she respected Wanda [name]'s wishes to allow a natural death. Wanda [name] shares that she is Wanda payee, but so far has not made health care decisions. Wanda [name] shares that when she saw Wanda [name] in the hospital about 4 weeks ago, Wanda [name] stated "my time has come". We talk about options such as [facility] Wanda [name] for rehab/dialysis, or hospice home of [name] for comfort and dignity. Wanda [name] states that [name] has not attended dialysis for the last month, and she does not see that Wanda [name] will start taking dialysis if she goes to [name]. Wanda [name] shares that she feels that inpatient hospice at [name] is the best choice. She agrees that I can share her thoughts with Wanda [name].  Family meeting in the room at noon with Wanda [name] and significant other of 13 years, Wanda [name]. We talk about Wanda [name]'s functional status. I share that due to  foot drop, fluid overload, I don't feel that Wanda [name] will be able to walk/care for herself. Wanda [name] agrees. He shares his worry about her physical health, her inability to care for herself, and her continued functional decline. We talk about what's next, we talk about [facility] Wanda [name] for rehab/dialysis.  Wanda [name] states, "if you're not going to do dialysis then there's no need to go to [name]". We talk about hospice home of [name] for comfort and dignity, pain and symptom management.  I share that Wanda [name] recommends hospice home for comfort and dignity. Annamarie states that her on Wanda [name] cares  more for her and would make a loving choice. Wanda [name] states that both his grandmother and his [name] had hospice services and he is agreeable to hospice. We talk about a discharge plan, that at this point, Wanda [name] has no needs that cannot be met outside of the hospital. Wanda [name] states, "there's nothing else this place can do for you". Wanda [name] agrees to have her information sent to hospice home of [name]. I share that they will not provide dialysis for her, but they will focus on comfort and dignity. I share that she will have medications for pain and anxiety, and any food or liquid that she wants. Wanda [name] agrees to have her information sent to inpatient hospice home of [name], but is unable to fully agree to transfer to hospice at this time.  Length of Stay: 9  Current Medications: Scheduled Meds:  . epoetin (EPOGEN/PROCRIT) injection  10,000 Units Intravenous Q M,W,F-HD  . lactulose  10-20 g Oral TID  . lidocaine  10 mL Mouth/Throat TID AC & HS  . midodrine  10 mg Oral BID WC  . pantoprazole  40 mg Oral Q0600  . sevelamer carbonate  1,600 mg Oral TID WC  . sucralfate  1 g Oral TID WC & HS  . triamcinolone cream   Topical BID    Continuous Infusions: . sodium chloride    . sodium chloride    . sodium chloride    . sodium chloride      PRN Meds: sodium chloride, sodium chloride,  sodium chloride, sodium chloride, acetaminophen **OR** acetaminophen, heparin, lidocaine (PF), lidocaine-prilocaine, LORazepam, methocarbamol, ondansetron **OR** ondansetron (ZOFRAN) IV, oxyCODONE, pentafluoroprop-tetrafluoroeth  Physical Exam  Constitutional: She is oriented to person, place, and time. No distress.  HENT:  Head: Atraumatic.  Cardiovascular: Normal rate.   Pulmonary/Chest: Effort normal. No respiratory distress.  Abdominal: Soft. She exhibits distension.  Musculoskeletal: She exhibits edema.  Neurological: She is alert and oriented to person, place, and time.  Skin: Skin is warm and dry.  Jaundiced  Nursing note and vitals reviewed.           Vital Signs: BP (!) 99/45 (BP Location: Left Arm)   Pulse 90   Temp 98.4 F (36.9 C) (Oral)   Resp 14   Ht 5' (1.524 m)   Wt 78.2 kg (172 lb 6.4 oz)   LMP 09/21/2013   SpO2 100%   BMI 33.67 kg/m  SpO2: SpO2: 100 % O2 Device: O2 Device: Not Delivered O2 Flow Rate:    Intake/output summary:  Intake/Output Summary (Last 24 hours) at 04/13/17 1339 Last data filed at 04/13/17 1100  Gross per 24 hour  Intake              960 ml  Output                0 ml  Net              960 ml   LBM: Last BM Date: 04/12/17 Baseline Weight: Weight: 90.7 kg (200 lb) Most recent weight: Weight: 78.2 kg (172 lb 6.4 oz)       Palliative Assessment/Data:    Flowsheet Rows     Most Recent Value  Intake Tab  Referral Department  Hospitalist  Unit at Time of Referral  ICU  Palliative Care Primary Diagnosis  Nephrology  Date Notified  04/04/17  Palliative Care Type  Return patient Palliative Care  Reason for referral  Clarify Goals of Care  Date  of Admission  04/04/17  Date first seen by Palliative Care  04/05/17  # of days Palliative referral response time  1 Day(s)  # of days IP prior to Palliative referral  0  Clinical Assessment  Palliative Performance Scale Score  30%  Pain Max last 24 hours  Not able to report  Pain Min  Last 24 hours  Not able to report  Dyspnea Max Last 24 Hours  Not able to report  Dyspnea Min Last 24 hours  Not able to report  Psychosocial & Spiritual Assessment  Palliative Care Outcomes  Patient/Family meeting held?  Yes  Who was at the meeting?  Patient only at this time  Palliative Care Outcomes  Clarified goals of care, Provided psychosocial or spiritual support      Patient Active Problem List   Diagnosis Date Noted  . Encounter for hospice care discussion   . Fall at home, initial encounter 04/04/2017  . Encounter for orogastric (OG) tube placement   . Hepatic encephalopathy ([place])   . Uremia   . Cellulitis 01/12/2017  . Bullous skin disease 12/22/2016  . ESRD (end stage renal disease) ([place]) 12/13/2016  . Volume overload 12/13/2016  . History of noncompliance with medical treatment   . Liver cirrhosis secondary to NASH (nonalcoholic steatohepatitis) ([place])   . Palliative care encounter   . Pulmonary edema 11/20/2016  . Hypocalcemia 11/20/2016  . Cardiomyopathy- ? ischemic  10/09/2016  . Troponin level elevated 10/09/2016  . Goals of care, counseling/discussion   . Palliative care by specialist   . Seizure ([place]) 10/07/2016  . Pressure injury of skin 05/20/2016  . Shock ([place]) 05/19/2016  . Rhonchi   . Chronic pain syndrome 08/22/2015  . Abdominal pain 08/07/2015  . Umbilical hernia without obstruction and without gangrene 08/07/2015  . GI bleed   . Acute blood loss anemia   . Hypotension 06/05/2015  . Edema of left lower extremity 02/22/2015  . Ascites   . History of esophageal varices with bleeding 09/16/2014  . End stage renal disease on dialysis ([place])   . Anemia of chronic disease   . Thrombocytopenia ([place]) 08/19/2014  . Hyponatremia 08/17/2014  . Esophageal varices without bleeding ([place])   . Portal hypertensive gastropathy ([place])   . Malnutrition of moderate degree ([place]) 05/15/2014  . Bipolar disorder ([place]) 12/04/2013  . SBP (spontaneous bacterial  peritonitis) ([place]) 11/10/2013  . Liver cirrhosis secondary to nonalcoholic steatohepatitis (NASH) ([place]) 11/07/2013  . Folate deficiency 10/13/2013  . Anasarca 10/10/2013  . Anemia 10/06/2013    Palliative Care Assessment & Plan   Patient Profile: 41 y.o.femalewith past medical history of Acute renal failure with hemodialysis, approximately one month since last dialysis treatment, cirrhosis of liver with ascites, history of SBP, C diff colitis, bleeding esophageal varices, anasarca, history of pneumonia, anxiety and depressionadmitted on 8/26/2018with fluid overload, end stage renal disease, need for dialysis.   Assessment: ESRD; Dialysis today. Ms. Cecere has not attended dialysis sessions for over one month. When asked about why she didn't go she states that she had so many other things going on including wounds and lesions. Significant other of 13 years, Wanda [name], is at bedside and he states that he can't make her go. ESLD: paracentesis 8/28 with 9.2 L removed.  Recommendations/Plan:  Wanda [name] agrees to have her information sent to hospice home of [facility] for placement. She understands that they will not provide dialysis for her. We talk about comfort and dignity, pain and anxiety medications. She has  not fully agreed to go to hospice home, but is considering this option, therefore we are sending her records.  Goals of Care and Additional Recommendations:  Limitations on Scope of Treatment: Full Scope Treatment  Code Status:    Code Status Orders        Start     Ordered   04/04/17 1736  Full code  Continuous     04/04/17 1737    Code Status History    Date Active Date Inactive Code Status Order ID Comments User Context   01/13/2017 12:00 AM 01/19/2017  6:04 PM Full Code [id number]  Oswald Hillock, MD Inpatient   12/22/2016 10:41 PM 12/24/2016  5:04 PM Full Code [id number]  Bethena Roys, MD Inpatient   12/13/2016 10:41 AM 12/21/2016 10:50 PM Full Code [id number]   Kathie Dike, MD Inpatient   11/20/2016  9:55 PM 11/26/2016  8:13 PM Full Code [id number]  Truett Mainland, DO Inpatient   10/30/2016  3:58 AM 10/30/2016 10:01 PM Full Code [id number]  Vianne Bulls, MD ED   10/07/2016  9:56 PM 10/10/2016  8:30 PM Full Code [id number]  Omar Person, NP Inpatient   09/23/2016 12:48 AM 09/27/2016 11:58 PM Full Code [id number]  Corey Harold, NP Inpatient   05/19/2016  7:40 PM 05/27/2016  6:47 PM Full Code [id number]  Corey Harold, NP Inpatient   04/29/2016  2:45 AM 04/30/2016  9:15 AM Full Code [id number]  Orvan Falconer, MD Inpatient   11/06/2015  4:35 AM 11/07/2015  5:21 PM Full Code [id number]  Phillips Grout, MD ED   08/20/2015  8:34 AM 08/22/2015  7:05 PM Full Code [id number]  Nita Sells, MD Inpatient   06/04/2015 10:06 PM 06/09/2015  9:37 PM Full Code [id number]  Corey Harold, NP Inpatient   10/25/2014 10:02 PM 11/01/2014  4:01 PM Full Code [id number]  Doree Albee, MD ED   09/16/2014  2:39 AM 09/19/2014  7:30 PM Full Code [id number]  Phillips Grout, MD Inpatient   08/27/2014  5:28 PM 08/29/2014 12:10 AM Full Code [id number]  Kathie Dike, MD Inpatient   08/27/2014 11:53 AM 08/27/2014  5:28 PM Full Code [id number]  Azzie Roup, MD HOV   08/13/2014 11:03 PM 08/27/2014 11:53 AM Full Code [id number]  Jani Gravel, MD Inpatient   07/03/2014  6:09 PM 07/07/2014 11:07 PM Full Code [id number]  Waldemar Dickens, MD Inpatient   05/14/2014  6:30 PM 05/18/2014  5:03 PM Full Code [id number]  Waldemar Dickens, MD Inpatient   04/19/2014  9:56 PM 04/27/2014  6:33 PM Full Code [id number]  Caren Griffins, MD ED   02/25/2014 12:46 AM 03/02/2014  5:03 PM Full Code [id number]  Bonnielee Haff, MD Inpatient   11/23/2013 11:52 AM 11/24/2013  3:31 AM Full Code [id number]  Arne Cleveland, MD HOV   11/05/2013  9:15 PM 11/17/2013  8:47 PM Full Code [id number]  Allyne Gee, MD Inpatient   10/06/2013  1:20 AM 10/13/2013  9:21 PM Full Code [id number]  Bonnielee Haff, MD ED   09/11/2013 11:21 PM  09/13/2013  5:23 PM Full Code [id number]  Bonnielee Haff, MD Inpatient       Prognosis:   < 6 weeks, would not be surprising based on no dialysis for one month prior to this hospitalization, frailty, functional decline, but down status at this point.   Discharge Planning:  To be determined, considering placement in hospice home of [facility] for comfort and dignity. Also  Considering [employer] Wanda [name].  Care plan was discussed with nursing staff, case manager, social worker, and Dr. Sarajane Jews.  Thank you for allowing the Palliative Medicine Team to assist in the care of this patient.   Time In: 1000 1200 Time Out: 2025 4270 Total Time 40+40= 80 minutes Prolonged Time Billed  yes       Greater than 50%  of this time was spent counseling and coordinating care related to the above assessment and plan.  Drue Novel, NP  Please contact Palliative Medicine Team phone at [phone] for questions and concerns.

## 2017-04-13 NOTE — Clinical Social Work Note (Signed)
Patient Information   Patient Name Geniva, Lohnes ([id number]) Sex Female DOB 1976-06-25 SSN [id number]   Room Bed  A304 A304-01  Patient Demographics   Address [place] [address] Phone [phone] (Home) *[phone] (Mobile)  Patient Ethnicity & Race   Ethnic Group Patient Race  Not Hispanic or Latino White or Caucasian  Emergency Contact(s)   Name Relation Home Work Piqua Significant other [phone]  [place] [phone]  [phone]  Izetta Dakin   [phone]  Documents on File    Status Date Received Description  Documents for the Patient  Release of Information Not Received    [place] Not Received    [place] E-Signature HIPAA Notice of Privacy Received 95/09/32   Driver's License Not Received    Insurance Card Not Received  [employer] and [employer] cards 01.2016  Advance Directives/Living Will/HCPOA/POA Not Received    Insurance Card Not Received    Guardianship Documents  67/12/45   Driver's License Received 80/99/83   Insurance Card Received 11/23/13   HIM ROI Authorization (Expired) 11/23/13 Need ED discharge notes. She has a follow up appointment today.  Release of Information  11/24/13   HIM ROI Authorization  02/01/14   HIM ROI Authorization  02/02/14   Release of Information  02/06/14   Release of Information  03/20/14   HIM ROI Authorization  06/08/14 Rudene Christians - CMS for [facility]  Insurance Card Received 08/13/14 [facility] medicare  HIM ROI Authorization  09/05/14   Release of Information  38/25/05   VVS Policy for Pain - E Signature     HIM ROI Authorization  10/10/14   Release of Information  10/11/14   Insurance Card Received 10/24/14 pt contract/kf/vvs  AMB Correspondence  09/25/14 APPROVAL BCBS  AMB Correspondence  09/25/14 FOLLOW UP [facility]MEDS  Other Photo ID Not Received    HIM ROI Authorization  11/05/14    Release of Information  11/05/14   HIM ROI Authorization  11/14/14   AMB Correspondence  11/15/14 REFERRAL BEFEKADU MD, B  AMB Correspondence  12/17/14 LETTER [facility]  HIM ROI Authorization  02/25/15   Insurance Card Received 02/27/15   Release of Information  03/01/15   HIM ROI Authorization (Expired) 04/11/15 Need ASAP for MD. Raymon Mutton notes from visit of 04/10/15 & before.  Release of Information  04/12/15   HIM ROI Authorization (Expired) 05/16/15 Please send ER notes from past two visits ASAP for MD & CN.  Release of Information  05/17/15   HIM ROI Authorization  06/11/15 [place] E-Signature HIPAA Notice of Privacy Received 06/19/15   HIM ROI Authorization  07/11/15   HIM ROI Authorization  08/07/15   HIM ROI Authorization  10/24/15   HIM ROI Authorization (Expired) 11/08/15 Please send H&P and Discharges for visit of 11/05/15 through 11/07/15.  Need ASAP for MD & CN.  Release of Information  11/11/15   HIM ROI Authorization  11/28/15   HIM ROI Authorization  01/29/16   HIM ROI Authorization (Expired) 02/20/16 Please send notes from Radiology for fluid removal on 02/19/16 ASAP for MD, CN.  Release of Information  02/21/16   Consent Form   paracentesis  Release of Information Received 04/21/16 [facility]  HIM ROI Authorization  04/22/16   HIM ROI Authorization (Expired) 06/12/16 [facility]  HIM ROI Authorization (Expired) 08/19/16 [facility]  HIM ROI Authorization (Expired) 09/07/16 [place] [facility]  HIM  ROI Authorization (Expired) 09/23/16 [facility] [place] (Expired) 09/23/16   HIM ROI Authorization  10/01/16   HIM ROI Authorization (Expired) 10/12/16 [place] [facility] [place] (Expired) 10/13/16 [place] [facility] [place] E-Signature HIPAA Notice of Privacy Signed 10/30/16   HIM ROI Authorization  11/04/16 [place] [facility]  HIM ROI Authorization (Expired) 11/30/16   HIM ROI Authorization (Expired) 11/30/16 [place] [facility]  HIM ROI Authorization  11/30/16 [facility]  HIM ROI Authorization (Expired) 11/30/16 [place] [facility] [place] Authorization  12/08/16 Social Security Disability  HIM ROI Authorization  12/08/16 [facility]  HIM ROI Authorization  12/11/16   AMB Correspondence  12/12/16 05/18-07/18 [facility]  Release of Information  12/18/16   HIM ROI Authorization  12/22/16 no auth  HIM ROI Authorization  12/29/16 [place] [facility]  AMB Provider Completed Forms  01/01/17 [facility]  HIM ROI Authorization  01/21/17 [facility]  AMB Provider Completed Forms  01/22/17 PROFESSIONAL COMMUNICATION [facility]  AMB HH/NH/Hospice  81/15/72 HH CERTIFICATION/POC [facility]  AMB HH/NH/Hospice (Deleted) 01/01/17 [facility]  Documents for the Encounter  EMS Run Sheet  04/04/17   AOB (Assignment of Insurance Benefits) Not Received    E-signature AOB Signed 04/04/17   MEDICARE RIGHTS Not Received    E-signature Medicare Rights Signed 04/04/17   EMS Run Sheet  04/04/17   Cardiac Monitoring Strip Shift Summary  04/04/17   ED Patient Billing Extract   ED PB Summary  Cardiac Monitoring Strip  04/04/17   EKG  04/05/17   Admission Information   Attending Provider Admitting Provider Admission Type Admission Date/Time  Samuella Cota, MD Kathie Dike, MD Emergency 04/04/17 [phone]  Discharge Date Hospital Service Auth/Cert Status Service Area   Internal Medicine Incomplete [place]  Unit Room/Bed Admission Status   AP-DEPT 300 A304/A304-01 Admission (Confirmed)   Admission   Complaint  [facility] Account   Name Acct ID Class Status Primary Coverage  Leticia, Coletta [id number] Inpatient Open BLUE [place]      Guarantor Account (for Hospital Account [id number])   Name Relation to Pt Service Area Active? Acct Type  Lisbeth Renshaw Self CHSA Yes Personal/Family  Address Phone    [place], [place] [address] [phone])        Coverage Information (for Hospital Account [id number])   F/O Payor/Plan Precert #  [facility] SHIELD MEDICARE/BCBS MEDICARE   Subscriber Subscriber #  Emika, Tiano [id number]  Address Phone  [address] [place], [place] [address] [phone]

## 2017-04-14 DIAGNOSIS — Z7189 Other specified counseling: Secondary | ICD-10-CM

## 2017-04-14 LAB — CBC
HCT: 27.5 % — ABNORMAL LOW (ref 36.0–46.0)
Hemoglobin: 8.7 g/dL — ABNORMAL LOW (ref 12.0–15.0)
MCH: 30.3 pg (ref 26.0–34.0)
MCHC: 31.6 g/dL (ref 30.0–36.0)
MCV: 95.8 fL (ref 78.0–100.0)
PLATELETS: 112 10*3/uL — AB (ref 150–400)
RBC: 2.87 MIL/uL — ABNORMAL LOW (ref 3.87–5.11)
RDW: 16.5 % — ABNORMAL HIGH (ref 11.5–15.5)
WBC: 6.7 10*3/uL (ref 4.0–10.5)

## 2017-04-14 LAB — RENAL FUNCTION PANEL
Albumin: 2.6 g/dL — ABNORMAL LOW (ref 3.5–5.0)
Anion gap: 14 (ref 5–15)
BUN: 59 mg/dL — ABNORMAL HIGH (ref 6–20)
CALCIUM: 8.6 mg/dL — AB (ref 8.9–10.3)
CHLORIDE: 96 mmol/L — AB (ref 101–111)
CO2: 23 mmol/L (ref 22–32)
CREATININE: 7.2 mg/dL — AB (ref 0.44–1.00)
GFR, EST AFRICAN AMERICAN: 7 mL/min — AB (ref >60)
GFR, EST NON AFRICAN AMERICAN: 6 mL/min — AB (ref >60)
Glucose, Bld: 97 mg/dL (ref 65–99)
PHOSPHORUS: 5.4 mg/dL — AB (ref 2.5–4.6)
Potassium: 3.6 mmol/L (ref 3.5–5.1)
Sodium: 133 mmol/L — ABNORMAL LOW (ref 135–145)

## 2017-04-14 MED ORDER — EPOETIN ALFA 10000 UNIT/ML IJ SOLN
INTRAMUSCULAR | Status: AC
  Administered 2017-04-14: 10000 [IU] via INTRAVENOUS
  Filled 2017-04-14: qty 1

## 2017-04-14 MED ORDER — MORPHINE SULFATE (PF) 2 MG/ML IV SOLN
2.0000 mg | Freq: Once | INTRAVENOUS | Status: AC
  Administered 2017-04-14: 2 mg via INTRAVENOUS
  Filled 2017-04-14: qty 1

## 2017-04-14 MED ORDER — METHOCARBAMOL 500 MG PO TABS: 500.0000 mg | ORAL_TABLET | Freq: Four times a day (QID) | ORAL | 1 refills | Status: AC | PRN

## 2017-04-14 MED ORDER — LORAZEPAM 1 MG PO TABS: ORAL_TABLET | ORAL | 3 refills | Status: AC

## 2017-04-14 MED ORDER — MORPHINE SULFATE (PF) 2 MG/ML IV SOLN
1.0000 mg | Freq: Four times a day (QID) | INTRAVENOUS | Status: DC | PRN
  Administered 2017-04-14 – 2017-04-15 (×4): 1 mg via INTRAVENOUS
  Filled 2017-04-14 (×4): qty 1

## 2017-04-14 MED ORDER — OXYCODONE HCL 10 MG PO TABS: 10.0000 mg | ORAL_TABLET | Freq: Three times a day (TID) | ORAL | 0 refills | Status: AC | PRN

## 2017-04-14 NOTE — Progress Notes (Signed)
PROGRESS NOTE  Wanda [name] Wanda [name] DOB: 1975/09/12 DOA: 04/04/2017 PCP: Wanda Simmer, MD  Brief Narrative: 41 year old woman PMH NASH cirrhosis, end-stage renal disease on hemodialysis, persistent noncompliance with hemodialysis and medications who presented with generalized pain after a fall at home, progressive lower extremity edema. Reportedly no dialysis over one month. Admitted for massive volume overload. Patient seen by nephrology and palliative medicine consultation. She has undergone hemodialysis during this hospitalization with minimal improvement and massive volume overload. She has been somewhat noncompliant with dialysis, at times ending treatments early, other times hemodialysis has been limited by hypotension. Cirrhosis has remained relatively stable the patient remains noncompliant with medications. She continues to have pain, predominantly in her right leg. She refuses further evaluation or imaging. She was evaluated by physical therapy and skilled nursing facility rehabilitation was recommended. Since that evaluation she has refused further physical therapy. At this point the patient is unable to care for herself at home. She is considering hospice versus skilled nursing facility.   Assessment/Plan End-stage renal disease on hemodialysis with massive volume overload/anasarca, associated hypocalcemia secondary to nonadherence to dialysis. Noncompliant with hemodialysis for 1 month.  Pt is on Tue/Thurs/Saturday schedule Palliative care  has discussed with the patient, no further dialysis if patient is going to hospice home Anticipate discharge tomorrow  NASH cirrhosis with ascites, acute hepatic encephalopathy, thrombocytopenia, esophageal varices. Status post large volume paracentesis 8/28. -Despite noncompliance with lactulose and hyperammonemia, the patient remains alert and oriented to person, location, month and year.  Chronic hypotension. -Asymptomatic.  Continue midodrine.  Anemia of end-stage renal disease. Stable. Status post 1 unit PRBC transfusion 8/27. No further labs  Fall at home. Reported right hip pain. X-rays negative. Physical therapy evaluation. -Pain seems to be inconsistent on examination. She continues to refuse any further evaluation with imaging.  Disposition- Overall her condition is relatively stable. I discussed with her again options including skilled nursing facility or hospice. I do not feel home as an option. This point she is unable to sit up, transfer or walk by herself.  Family and patient have decided to go to hospice home tomorrow   DVT prophylaxis: low plts. She has refused heparin in the past. Massive BLE edema precludes SCDs. Code Status: full Family Communication: none Disposition Plan: Hospice home tomorrow     Consultants:  Nephrology  Palliative medicine  Procedures:  Transfusion one unit PRBC 8/27  Large-volume paracentesis 8/28  Antimicrobials:    Interval history/Subjective: She feels "about the same". Continues to have pain especially on her right side. Some pain in her right side of the chest has been present for a week since her fall. Painful with palpation of the chest wall.  Objective: Vitals:   Vitals:   04/14/17 1500 04/14/17 1530  BP: (!) 98/42 (!) 101/52  Pulse: 73 83  Resp:    Temp:    SpO2:       Exam:     Constitutional. Appears chronically ill. Nontoxic.  Psychiatric. Withdrawn. Poor eye contact. Appears depressed. Flat affect.  Cardiovascular. Regular rate and rhythm. 3/6 systolic murmur. No rub or gallop. No change in massive bilateral lower extremity edema up to the abdominal wall.  Respiratory. Clear to auscultation bilaterally. No wheezes, rales or rhonchi. Normal respiratory effort.  Abdomen is firm secondary to significant abdominal wall edema.  Able to lift both heels off the bed and able to bend right knee further today perhaps as much as 30.  No apparent pain with active movement. Apparent mild  pain with passive movement of the right leg.  I have personally reviewed the following:   Labs:  No laboratory studies today.  Scheduled Meds: . epoetin (EPOGEN/PROCRIT) injection  10,000 Units Intravenous Q M,W,F-HD  . lactulose  10-20 g Oral TID  . lidocaine  10 mL Mouth/Throat TID AC & HS  . midodrine  10 mg Oral BID WC  . pantoprazole  40 mg Oral Q0600  . sevelamer carbonate  1,600 mg Oral TID WC  . sucralfate  1 g Oral TID WC & HS  . triamcinolone cream   Topical BID   Continuous Infusions: . sodium chloride    . sodium chloride    . sodium chloride    . sodium chloride      Principal Problem:   ESRD (end stage renal disease) (HCC) Active Problems:   Anasarca   Thrombocytopenia (HCC)   Anemia of chronic disease   Pressure injury of skin   Liver cirrhosis secondary to NASH (nonalcoholic steatohepatitis) (Wanda [name])   Hepatic encephalopathy (Wanda [name])   Uremia   Fall at home, initial encounter   Encounter for hospice care discussion   LOS: 10 days    Time spent greater than 35 minutes, greater than 50% counseling and coordination of care, specifically discussion with palliative care Wanda [name], as well as case management and the patient.

## 2017-04-14 NOTE — Progress Notes (Signed)
Daily Progress Note   Patient Name: Wanda [name]       Date: 04/14/2017 DOB: 1975-08-24  Age: 41 y.o. MRN#: [id number] Attending Physician: Reyne Dumas, MD Primary Care Physician: [name], MD Admit Date: 04/04/2017  Reason for Consultation/Follow-up: Establishing goals of care and Psychosocial/spiritual support  Subjective: Wanda [name] is resting quietly in bed. She will briefly make eye contact at times. She is, as usual, subdued. I share that we can send her information to rehab, to see if they're interested in accepting her as a patient. She agrees. Representative from [facility] arrives, and speaks with Wanda [name].  I returned later in the morning to share that rehab has not offered a bed, but hospice home of [facility] has a bed and is willing to take her. Wanda [name] states that she wants to wait to make a decision until her significant other of 15 years, [name], arrives.  Call to [name]. He states that he will be arriving soon. I share that Wanda [name] is no longer being offered a better rehab, but she does have a bed at hospice of [facility].   Conference with case management and social work regarding case. Conference with Dr. Allyson Sabal. Later in the day case management notifies that Wanda [name] has elected hospice home of [facility] she will transfer tomorrow.  Length of Stay: 10  Current Medications: Scheduled Meds:  . epoetin (EPOGEN/PROCRIT) injection  10,000 Units Intravenous Q M,W,F-HD  . lactulose  10-20 g Oral TID  . lidocaine  10 mL Mouth/Throat TID AC & HS  . midodrine  10 mg Oral BID WC  . pantoprazole  40 mg Oral Q0600  . sevelamer carbonate  1,600 mg Oral TID WC  . sucralfate  1 g Oral TID WC & HS  . triamcinolone cream   Topical  BID    Continuous Infusions: . sodium chloride    . sodium chloride    . sodium chloride    . sodium chloride      PRN Meds: sodium chloride, sodium chloride, sodium chloride, sodium chloride, acetaminophen **OR** acetaminophen, heparin, lidocaine (PF), lidocaine-prilocaine, LORazepam, methocarbamol, morphine injection, ondansetron **OR** ondansetron (ZOFRAN) IV, oxyCODONE, pentafluoroprop-tetrafluoroeth  Physical Exam  Constitutional: She is oriented to person, place, and time. No distress.  Appears frail, chronically ill  Cardiovascular:  Normal rate.   Pulmonary/Chest: Effort normal. No respiratory distress.  Abdominal: She exhibits distension. There is tenderness.  Musculoskeletal: She exhibits edema.  Neurological: She is alert and oriented to person, place, and time.  Skin: Skin is warm and dry.  Jaundiced  Nursing note and vitals reviewed.           Vital Signs: BP (!) 101/52   Pulse 83   Temp 98.7 F (37.1 C) (Oral)   Resp 20   Ht 5' (1.524 m)   Wt 79.2 kg (174 lb 9.7 oz)   LMP 09/21/2013   SpO2 97%   BMI 34.10 kg/m  SpO2: SpO2: 97 % O2 Device: O2 Device: Not Delivered O2 Flow Rate:    Intake/output summary:  Intake/Output Summary (Last 24 hours) at 04/14/17 1626 Last data filed at 04/14/17 0900  Gross per 24 hour  Intake              360 ml  Output               10 ml  Net              350 ml   LBM: Last BM Date: 04/12/17 Baseline Weight: Weight: 90.7 kg (200 lb) Most recent weight: Weight: 79.2 kg (174 lb 9.7 oz)       Palliative Assessment/Data:    Flowsheet Rows     Most Recent Value  Intake Tab  Referral Department  Hospitalist  Unit at Time of Referral  ICU  Palliative Care Primary Diagnosis  Nephrology  Date Notified  04/04/17  Palliative Care Type  Return patient Palliative Care  Reason for referral  Clarify Goals of Care  Date of Admission  04/04/17  Date first seen by Palliative Care  04/05/17  # of days Palliative referral  response time  1 Day(s)  # of days IP prior to Palliative referral  0  Clinical Assessment  Palliative Performance Scale Score  30%  Pain Max last 24 hours  Not able to report  Pain Min Last 24 hours  Not able to report  Dyspnea Max Last 24 Hours  Not able to report  Dyspnea Min Last 24 hours  Not able to report  Psychosocial & Spiritual Assessment  Palliative Care Outcomes  Patient/Family meeting held?  Yes  Who was at the meeting?  Patient only at this time  Palliative Care Outcomes  Clarified goals of care, Provided psychosocial or spiritual support      Patient Active Problem List   Diagnosis Date Noted  . Encounter for hospice care discussion   . Fall at home, initial encounter 04/04/2017  . Encounter for orogastric (OG) tube placement   . Hepatic encephalopathy ([place])   . Uremia   . Cellulitis 01/12/2017  . Bullous skin disease 12/22/2016  . ESRD (end stage renal disease) ([place]) 12/13/2016  . Volume overload 12/13/2016  . History of noncompliance with medical treatment   . Liver cirrhosis secondary to NASH (nonalcoholic steatohepatitis) ([place])   . Palliative care encounter   . Pulmonary edema 11/20/2016  . Hypocalcemia 11/20/2016  . Cardiomyopathy- ? ischemic  10/09/2016  . Troponin level elevated 10/09/2016  . Goals of care, counseling/discussion   . Palliative care by specialist   . Seizure ([place]) 10/07/2016  . Pressure injury of skin 05/20/2016  . Shock ([place]) 05/19/2016  . Rhonchi   . Chronic pain syndrome 08/22/2015  . Abdominal pain 08/07/2015  . Umbilical hernia without obstruction and without gangrene 08/07/2015  .  GI bleed   . Acute blood loss anemia   . Hypotension 06/05/2015  . Edema of left lower extremity 02/22/2015  . Ascites   . History of esophageal varices with bleeding 09/16/2014  . End stage renal disease on dialysis ([place])   . Anemia of chronic disease   . Thrombocytopenia ([place]) 08/19/2014  . Hyponatremia 08/17/2014  . Esophageal varices  without bleeding ([place])   . Portal hypertensive gastropathy ([place])   . Malnutrition of moderate degree ([place]) 05/15/2014  . Bipolar disorder ([place]) 12/04/2013  . SBP (spontaneous bacterial peritonitis) ([place]) 11/10/2013  . Liver cirrhosis secondary to nonalcoholic steatohepatitis (NASH) ([place]) 11/07/2013  . Folate deficiency 10/13/2013  . Anasarca 10/10/2013  . Anemia 10/06/2013    Palliative Care Assessment & Plan   Patient Profile: 41 y.o.femalewith past medical history of Acute renal failure with hemodialysis, approximately one month since last dialysis treatment, cirrhosis of liver with ascites, history of SBP, C diff colitis, bleeding esophageal varices, anasarca, history of pneumonia, anxiety and depressionadmitted on 8/26/2018with fluid overload, end stage renal disease, need for dialysis.   Assessment: ESRD; Dialysis today. Wanda [name] has not attended dialysis sessions for over one month. When asked about why she didn't go she states that she had so many other things going on including wounds and lesions. Significant other of 13 years, Wanda [name],is at bedside and he states that he can't make her go. 9/5 Wanda [name] shares with brine center representative that today is her last dialysis session. ESLD: paracentesis 8/28 with 9.2 L removed.  Recommendations/Plan:  we discussed home with home health versus hospice home of [facility]. [name] after speaking with her family, elects hospice home of [place] and Additional Recommendations:  Limitations on Scope of Treatment: Full Comfort Care  Code Status:    Code Status Orders        Start     Ordered   04/04/17 1736  Full code  Continuous     04/04/17 1737    Code Status History    Date Active Date Inactive Code Status Order ID Comments User Context   01/13/2017 12:00 AM 01/19/2017  6:04 PM Full Code [id number]  Oswald Hillock, MD Inpatient   12/22/2016 10:41 PM 12/24/2016  5:04 PM Full Code [id number]   Bethena Roys, MD Inpatient   12/13/2016 10:41 AM 12/21/2016 10:50 PM Full Code [id number]  Kathie Dike, MD Inpatient   11/20/2016  9:55 PM 11/26/2016  8:13 PM Full Code [id number]  Truett Mainland, DO Inpatient   10/30/2016  3:58 AM 10/30/2016 10:01 PM Full Code [id number]  Vianne Bulls, MD ED   10/07/2016  9:56 PM 10/10/2016  8:30 PM Full Code [id number]  Omar Person, NP Inpatient   09/23/2016 12:48 AM 09/27/2016 11:58 PM Full Code [id number]  Corey Harold, NP Inpatient   05/19/2016  7:40 PM 05/27/2016  6:47 PM Full Code [id number]  Corey Harold, NP Inpatient   04/29/2016  2:45 AM 04/30/2016  9:15 AM Full Code [id number]  Orvan Falconer, MD Inpatient   11/06/2015  4:35 AM 11/07/2015  5:21 PM Full Code [id number]  Phillips Grout, MD ED   08/20/2015  8:34 AM 08/22/2015  7:05 PM Full Code [id number]  Nita Sells, MD Inpatient   06/04/2015 10:06 PM 06/09/2015  9:37 PM Full Code [id number]  Corey Harold, NP Inpatient   10/25/2014 10:02 PM 11/01/2014  4:01 PM Full Code [id number]  Doree Albee, MD ED  09/16/2014  2:39 AM 09/19/2014  7:30 PM Full Code [id number]  Phillips Grout, MD Inpatient   08/27/2014  5:28 PM 08/29/2014 12:10 AM Full Code [id number]  Kathie Dike, MD Inpatient   08/27/2014 11:53 AM 08/27/2014  5:28 PM Full Code [id number]  Azzie Roup, MD [place]   08/13/2014 11:03 PM 08/27/2014 11:53 AM Full Code [id number]  Jani Gravel, MD Inpatient   07/03/2014  6:09 PM 07/07/2014 11:07 PM Full Code [id number]  Waldemar Dickens, MD Inpatient   05/14/2014  6:30 PM 05/18/2014  5:03 PM Full Code [id number]  Waldemar Dickens, MD Inpatient   04/19/2014  9:56 PM 04/27/2014  6:33 PM Full Code [id number]  Caren Griffins, MD ED   02/25/2014 12:46 AM 03/02/2014  5:03 PM Full Code [id number]  Bonnielee Haff, MD Inpatient   11/23/2013 11:52 AM 11/24/2013  3:31 AM Full Code [id number]  Arne [place], MD HOV   11/05/2013  9:15 PM 11/17/2013  8:47 PM Full Code [id number]  Allyne Gee, MD Inpatient   10/06/2013   1:20 AM 10/13/2013  9:21 PM Full Code [id number]  Bonnielee Haff, MD ED   09/11/2013 11:21 PM 09/13/2013  5:23 PM Full Code [id number]  Bonnielee Haff, MD Inpatient       Prognosis:   < 6 weeks, would not be surprising based on no further hemodialysis.  Discharge Planning:  Transferred to the hospice home of [facility] for comfort and dignity at the end of life.  Care plan was discussed with nursing staff, case manager, social worker, HD nursing staff, and Dr. Allyson Sabal.   Thank you for allowing the Palliative Medicine Team to assist in the care of this patient.   Time In: 1055  Time Out: 1130  Total Time 35 minutes  Prolonged Time Billed  no       Greater than 50%  of this time was spent counseling and coordinating care related to the above assessment and plan.  Drue Novel, NP  Please contact Palliative Medicine Team phone at [phone] for questions and concerns.

## 2017-04-14 NOTE — Progress Notes (Addendum)
Wanda [name]  MRN: [id number]  DOB/AGE: 1975-09-16 41 y.o.  Primary Care Physician:Roberson, [name], MD  Admit date: 04/04/2017  Chief Complaint:  Chief Complaint  Patient presents with  . Fall    S-Pt presented on  04/04/2017 with  Chief Complaint  Patient presents with  . Fall  .    Pt offer no new complaints.     Pt says " I am just waiting for them to find me a place"   meds   . epoetin (EPOGEN/PROCRIT) injection  10,000 Units Intravenous Q M,W,F-HD  . lactulose  10-20 g Oral TID  . lidocaine  10 mL Mouth/Throat TID AC & HS  . midodrine  10 mg Oral BID WC  . pantoprazole  40 mg Oral Q0600  . sevelamer carbonate  1,600 mg Oral TID WC  . sucralfate  1 g Oral TID WC & HS  . triamcinolone cream   Topical BID      Physical Exam: Vital signs in last 24 hours: Temp:  [98.4 F (36.9 C)-98.7 F (37.1 C)] 98.7 F (37.1 C) (09/05 0622) Pulse Rate:  [82-101] 101 (09/05 0622) Resp:  [20] 20 (09/05 0622) BP: (97-118)/(45-60) 118/60 (09/05 0622) SpO2:  [97 %-100 %] 97 % (09/05 0622) Weight:  [174 lb 9.7 oz (79.2 kg)] 174 lb 9.7 oz (79.2 kg) (09/05 0622) Weight change: 10.6 oz (0.3 kg) Last BM Date: 04/12/17  Intake/Output from previous day: 09/04 0701 - 09/05 0700 In: 840 [P.O.:840] Out: 10 [Urine:10] No intake/output data recorded.   Physical Exam: General- pt is awake,alert, follows commands Resp- No acute REsp distress, NO Rhonchi CVS- S1S2 regular in rate and rhythm GIT- BS+, soft, NT, distended EXT- 2+ LE Edema, No Cyanosis Access- AVF +  Lab Results: CBC  Recent Labs  04/12/17 0836 04/14/17 0733  WBC 6.6 6.7  HGB 9.8* 8.7*  HCT 30.5* 27.5*  PLT 102* 112*    BMET  Recent Labs  04/12/17 0836 04/14/17 0733  NA 134* 133*  K 3.8 3.6  CL 94* 96*  CO2 23 23  GLUCOSE 99 97  BUN 68* 59*  CREATININE 8.74* 7.20*  CALCIUM 8.2* 8.6*    MICRO Recent Results (from the past 240 hour(s))  MRSA PCR Screening     Status: None   Collection  Time: 04/04/17 12:00 PM  Result Value Ref Range Status   MRSA by PCR NEGATIVE NEGATIVE Final    Comment:        The GeneXpert MRSA Assay (FDA approved for NASAL specimens only), is one component of a comprehensive MRSA colonization surveillance program. It is not intended to diagnose MRSA infection nor to guide or monitor treatment for MRSA infections.   Culture, body fluid-bottle     Status: None   Collection Time: 04/06/17  2:00 PM  Result Value Ref Range Status   Specimen Description ASCITIC COLLECTED BY DOCTOR  Final   Special Requests BOTTLES DRAWN AEROBIC AND ANAEROBIC 10 CC EACH  Final   Culture NO GROWTH 5 DAYS  Final   Report Status 04/11/2017 FINAL  Final  Gram stain     Status: None   Collection Time: 04/06/17  2:00 PM  Result Value Ref Range Status   Specimen Description ASCITIC  Final   Special Requests NONE  Final   Gram Stain   Final    CYTOSPIN SMEAR NO ORGANISMS SEEN WBC SEEN WBC PRESENT, PREDOMINANTLY MONONUCLEAR    Report Status 04/06/2017 FINAL  Final  Lab Results  Component Value Date   CALCIUM 8.6 (L) 04/14/2017   CAION 0.86 (LL) 01/12/2017   PHOS 5.4 (H) 04/14/2017            Impression: 1)RenalESRD  HD started on  08/18/14  Pt is on Tue/Thurs/Saturday schedule   Pt has have hx of non adherence to dialysis                Pt missed last month of HD                Will dialyze again today  2)CVS- Hemodynamically fragile  BP low  On Midodrine   3)Anemia IN ESRD the goal for HGb is  9--11 Pt hgb is not at  the goal   on  epo    4)Liver- Cirrhosis Sec to Steato hepatitis s/p tap 04/06/17 Primary team following  5)Electrolytes  Normokalemia  Hyponatremia ESRD and cirrhosis    6)CKD-BMD disease Hypocalcemia- sec to non adherence to dialysis tx now better  Phosphorus at goal  on binders   7)Acid base Co2 now   at goal NON  AG acidosis sec to non adherence o dialysis tx .   8) Thrombocytopenia-Stable Primary MD following      Plan:  Will dialyze today Will use 3k/2.5 ca bath Will keep on epo Will try to take 2.5 liters off  I educated pt again about need for adhrence to dialysis tx.    Addendum Pt seen on dialysis. Pt tolerated tx well.   Wanda [name] S 04/14/2017, 10:22 AM

## 2017-04-15 MED ORDER — MORPHINE SULFATE (PF) 2 MG/ML IV SOLN
2.0000 mg | INTRAVENOUS | Status: DC | PRN
  Administered 2017-04-15 (×2): 2 mg via INTRAVENOUS
  Filled 2017-04-15 (×2): qty 1

## 2017-04-15 NOTE — Clinical Social Work Note (Signed)
Patient is agreeable to [name]. LCSW to arrange transport. Patient's boyfriend, [name], is aware of plan and discharge.   LCSW signing off.        Xzaiver Vayda, Clydene Pugh, LCSW

## 2017-04-15 NOTE — Progress Notes (Signed)
Discharged via EMS to Hospice home of [facility]

## 2017-04-15 NOTE — Progress Notes (Signed)
Report called to [name]

## 2017-04-15 NOTE — Progress Notes (Signed)
PT Cancellation Note  Patient Details Name: Wanda [name] MRN: [id number] DOB: 1976-08-01   Cancelled Treatment:    Reason Eval/Treat Not Completed: Patient declined, no reason specified   3:35 PM, 04/15/17 Lonell Grandchild, MPT Physical Therapist with [facility] 336 [phone] office [phone] mobile phone

## 2017-04-15 NOTE — Discharge Summary (Signed)
Physician Discharge Summary  Wanda [name] MRN: [id number] DOB/AGE: 43/12/1975 43 y.o.  PCP: [name], MD   Admit date: 04/04/2017 Discharge date: 04/15/2017  Discharge Diagnoses:    Principal Problem:   ESRD (end stage renal disease) ([place]) Active Problems:   Anasarca   Thrombocytopenia ([place])   Anemia of chronic disease   Pressure injury of skin   Liver cirrhosis secondary to NASH (nonalcoholic steatohepatitis) ([place])   Hepatic encephalopathy ([place])   Uremia   Fall at home, initial encounter   Encounter for hospice care discussion     Patient is being discharged to inpatient hospice home at [facility] Prognosis:   < 6 weeks, would not be surprising based on no further hemodialysis       Current Discharge Medication List    START taking these medications   Details  methocarbamol (ROBAXIN) 500 MG tablet Take 1 tablet (500 mg total) by mouth every 6 (six) hours as needed for muscle spasms. Qty: 30 tablet, Refills: 1      CONTINUE these medications which have CHANGED   Details  LORazepam (ATIVAN) 1 MG tablet Take 1/2 to 1 tablet by mouth twice daily as needed for back spasm or for anxiety. Qty: 30 tablet, Refills: 3    Oxycodone HCl 10 MG TABS Take 1 tablet (10 mg total) by mouth 3 (three) times daily as needed. FOR PAIN Qty: 30 tablet, Refills: 0      CONTINUE these medications which have NOT CHANGED   Details  augmented betamethasone dipropionate (DIPROLENE-AF) 0.05 % cream APPLY TO THE AFFECTED AREAS ON LEGS TWICE DAILY AS NEEDED (NOT FACE, GROIN, OR UNDER ARMS) Refills: 3    Darbepoetin Alfa (ARANESP) 60 MCG/0.3ML SOSY injection Inject 0.3 mLs (60 mcg total) into the vein every Thursday with hemodialysis. Qty: 4.2 mL    lactulose (CHRONULAC) 10 GM/15ML solution Take 15-30 mLs (10-20 g total) by mouth 3 (three) times daily. TAKE 15-30 ml BY MOUTH three times a day    lidocaine (XYLOCAINE) 2 % solution TAKE TWO TEASPOONSFUL (10ML) BY MOUTH  BEFORE MEALS AND AT BEDTIME TO PREVENT CHEST PAIN WHILE EATING Qty: 500 mL, Refills: 1    lidocaine-prilocaine (EMLA) cream Apply 1 application topically as needed (for dialysis treatments).    midodrine (PROAMATINE) 5 MG tablet TAKE TWO (2) TABLETS BY MOUTH DAILY. TAKE ONLY ON DIALYSIS DAYS ON TUESDAYS, THURDAYS, AND SATURDAYS (SOMETIME TAKES TREATMENTS ON FRIDAY IN Refills: 3    ondansetron (ZOFRAN) 4 MG tablet Take 1 tablet (4 mg total) by mouth every 8 (eight) hours as needed for nausea or vomiting. Qty: 30 tablet, Refills: 1    RENVELA 800 MG tablet Take 1600 mg by mouth 3 times daily with meals. Take 800 mg by mouth with snacks. Refills: 5    sucralfate (CARAFATE) 1 GM/10ML suspension Take 1 g by mouth 4 (four) times daily -  with meals and at bedtime.    chlorhexidine gluconate, MEDLINE KIT, (PERIDEX) 0.12 % solution 15 mLs by Mouth Rinse route 2 (two) times daily. Qty: 120 mL, Refills: 0    clotrimazole (LOTRIMIN) 1 % cream Apply 1 application topically 3 (three) times daily. Qty: 12 g, Refills: 1    omeprazole (PRILOSEC) 20 MG capsule 1 po every morning 30 minutes prior to your first meal. Qty: 31 capsule, Refills: 11    pantoprazole (PROTONIX) 40 MG tablet Take 1 tablet (40 mg total) by mouth daily at 6 (six) AM. Qty: 30 tablet, Refills: 1  Discharge Condition: Overall prognosis guarded    Discharge Instructions    Diet - low sodium heart healthy    Complete by:  As directed    Increase activity slowly    Complete by:  As directed        Allergies  Allergen Reactions  . Prednisone Swelling  . Iohexol Rash    Pt states rash after last CT. Pt needs pre-meds  . Lasix [Furosemide] Other (See Comments)    "doesn't work"  . Latex Itching      Disposition: Hospice home   Consults:  Nephrology Palliative care     Significant Diagnostic Studies:  Dg Pelvis Portable  Result Date: 04/04/2017 CLINICAL DATA:  Fall. EXAM: PORTABLE PELVIS 1-2  VIEWS COMPARISON:  CT from 11/23/2016 FINDINGS: Bone detail is limited due to extensive soft tissue. Pelvic bony ring appears to be grossly intact. Normal alignment of both hips. No gross hip fracture. IMPRESSION: Limited examination of the pelvis as described. No gross acute abnormality. Electronically Signed   By: Markus Daft M.D.   On: 04/04/2017 09:26   US Paracentesis  Result Date: 04/06/2017 INDICATION: Cirrhosis due to NASH, recurrent ascites EXAM: ULTRASOUND GUIDED DIAGNOSTIC AND THERAPEUTIC PARACENTESIS MEDICATIONS: None. COMPLICATIONS: None immediate. PROCEDURE: Procedure performed portably in the ICU. Procedure, benefits, and risks of procedure were discussed with patient. Written informed consent for procedure was obtained. Time out protocol followed. Adequate collection of ascites localized by ultrasound in RIGHT lower quadrant. Skin prepped and draped in usual sterile fashion. Skin and soft tissues anesthetized with 10 mL of 1% lidocaine. 5 [place] Yueh catheter placed into peritoneal cavity. 9.2 L of yellow ascitic fluid aspirated by vacuum bottle suction. Procedure tolerated well by patient without immediate complication. FINDINGS: A total of approximately 9.2 L of ascitic fluid was removed. Samples were sent to the laboratory as requested by the clinical team. IMPRESSION: Successful ultrasound-guided paracentesis yielding 9.2 liters of peritoneal fluid. Electronically Signed   By: Lavonia Dana M.D.   On: 04/06/2017 12:39   Dg Chest Portable 1 View  Result Date: 04/04/2017 CLINICAL DATA:  Patient fell.  Pain. EXAM: PORTABLE CHEST 1 VIEW COMPARISON:  03/02/2017. FINDINGS: Mild cardiac enlargement. Mild vascular congestion. No overt failure or infiltrates. No visible rib fracture. No pneumothorax. Thoracic atherosclerosis. IMPRESSION: Mild cardiac enlargement.  No active infiltrates or failure. Electronically Signed   By: Staci Righter M.D.   On: 04/04/2017 09:26        Filed Weights    04/14/17 0622 04/14/17 1200 04/15/17 0455  Weight: 79.2 kg (174 lb 9.7 oz) 79.2 kg (174 lb 9.7 oz) 78.9 kg (174 lb)     Microbiology: Recent Results (from the past 240 hour(s))  Culture, body fluid-bottle     Status: None   Collection Time: 04/06/17  2:00 PM  Result Value Ref Range Status   Specimen Description ASCITIC COLLECTED BY DOCTOR  Final   Special Requests BOTTLES DRAWN AEROBIC AND ANAEROBIC 10 CC EACH  Final   Culture NO GROWTH 5 DAYS  Final   Report Status 04/11/2017 FINAL  Final  Gram stain     Status: None   Collection Time: 04/06/17  2:00 PM  Result Value Ref Range Status   Specimen Description ASCITIC  Final   Special Requests NONE  Final   Gram Stain   Final    CYTOSPIN SMEAR NO ORGANISMS SEEN WBC SEEN WBC PRESENT, PREDOMINANTLY MONONUCLEAR    Report Status 04/06/2017 FINAL  Final  Blood Culture    Component Value Date/Time   SDES ASCITIC COLLECTED BY DOCTOR 04/06/2017 1400   SDES ASCITIC 04/06/2017 1400   SPECREQUEST BOTTLES DRAWN AEROBIC AND ANAEROBIC 10 CC EACH 04/06/2017 1400   SPECREQUEST NONE 04/06/2017 1400   CULT NO GROWTH 5 DAYS 04/06/2017 1400   REPTSTATUS 04/11/2017 FINAL 04/06/2017 1400   REPTSTATUS 04/06/2017 FINAL 04/06/2017 1400      Labs: Results for orders placed or performed during the hospital encounter of 04/04/17 (from the past 48 hour(s))  Renal function panel     Status: Abnormal   Collection Time: 04/14/17  7:33 AM  Result Value Ref Range   Sodium 133 (L) 135 - 145 mmol/L   Potassium 3.6 3.5 - 5.1 mmol/L   Chloride 96 (L) 101 - 111 mmol/L   CO2 23 22 - 32 mmol/L   Glucose, Bld 97 65 - 99 mg/dL   BUN 59 (H) 6 - 20 mg/dL   Creatinine, Ser 7.20 (H) 0.44 - 1.00 mg/dL   Calcium 8.6 (L) 8.9 - 10.3 mg/dL   Phosphorus 5.4 (H) 2.5 - 4.6 mg/dL   Albumin 2.6 (L) 3.5 - 5.0 g/dL   GFR calc non Af Amer 6 (L) >60 mL/min   GFR calc Af Amer 7 (L) >60 mL/min    Comment: (NOTE) The eGFR has been calculated using the CKD EPI  equation. This calculation has not been validated in all clinical situations. eGFR's persistently <60 mL/min signify possible Chronic Kidney Disease.    Anion gap 14 5 - 15  CBC     Status: Abnormal   Collection Time: 04/14/17  7:33 AM  Result Value Ref Range   WBC 6.7 4.0 - 10.5 K/uL   RBC 2.87 (L) 3.87 - 5.11 MIL/uL   Hemoglobin 8.7 (L) 12.0 - 15.0 g/dL   HCT 27.5 (L) 36.0 - 46.0 %   MCV 95.8 78.0 - 100.0 fL   MCH 30.3 26.0 - 34.0 pg   MCHC 31.6 30.0 - 36.0 g/dL   RDW 16.5 (H) 11.5 - 15.5 %   Platelets 112 (L) 150 - 400 K/uL    Comment: CONSISTENT WITH PREVIOUS RESULT     Lipid Panel     Component Value Date/Time   TRIG 147 10/07/2016 2357     No results found for: HGBA1C   Lab Results  Component Value Date   CREATININE 7.20 (H) 04/14/2017     HPI :  41 year old woman PMH NASH cirrhosis, end-stage renal disease on hemodialysis, persistent noncompliance with hemodialysis and medications who presented with generalized pain after a fall at home, progressive lower extremity edema. Reportedly no dialysis over one month. Admitted for massive volume overload. Patient seen by nephrology and palliative medicine consultation. She has undergone hemodialysis during this hospitalization with minimal improvement and massive volume overload. She has been somewhat noncompliant with dialysis, at times ending treatments early, other times hemodialysis has been limited by hypotension. Cirrhosis has remained relatively stable the patient remains noncompliant with medications. She continues to have pain, predominantly in her right leg. She refuses further evaluation or imaging. Palliative care was consulted for orders of care, after discussion , and after being evaluated by a representative from the [facility], who spoke with the patient patient has decided to  go to hospice home of [place]:   End-stage renal disease on hemodialysis with massive volume  overload/anasarca, associated hypocalcemia secondary to nonadherence to dialysis. Noncompliant with hemodialysis for 1 month. Prior to  discharge was on Tue/Thurs/Saturday schedule Palliative care  has discussed with the patient, no further dialysis if patient is going to hospice home Also unable to tolerate hemodialysis due to low blood pressure, on midodrine  Judy after speaking with her family, elects hospice home of [facility]  NASH cirrhosis with ascites, acute hepatic encephalopathy, thrombocytopenia, esophageal varices. Status post large volume paracentesis 8/28. with 9.2 L removed Despite noncompliance with lactulose and hyperammonemia, the patient remains alert and oriented to person, location, month and year.  Chronic hypotension. -Asymptomatic. Continue midodrine.  Anemia of end-stage renal disease. Stable. Status post 1 unit PRBC transfusion 8/27. No further labs  Fall at home. Reported right hip pain. X-rays negative. Physical therapy evaluation. -Pain seems to be inconsistent on examination. She continues to refuse any further evaluation with imaging.    Discharge Exam:   Blood pressure (!) 119/58, pulse 97, temperature 98.4 F (36.9 C), temperature source Oral, resp. rate 19, height 5' (1.524 m), weight 78.9 kg (174 lb), last menstrual period 09/21/2013, SpO2 98 %.   Psychiatric. Withdrawn. Poor eye contact. Appears depressed. Flat affect.  Cardiovascular. Regular rate and rhythm. 3/6 systolic murmur. No rub or gallop. No change in massive bilateral lower extremity edema up to the abdominal wall.  Respiratory. Clear to auscultation bilaterally. No wheezes, rales or rhonchi. Normal respiratory effort.  Abdomen is firm secondary to significant abdominal wall edema.  Able to lift both heels off the bed and able to bend right knee further today perhaps as much as 30. No apparent pain with active movement. Apparent mild pain with passive movement of the right  leg.      SignedReyne Dumas 04/15/2017, 8:19 AM        Time spent >1 hour

## 2017-04-15 NOTE — Care Management Note (Signed)
Case Management Note  Patient Details  Name: Wanda [name] MRN: [id number] Date of Birth: 06-19-1976  If discussed at [place] Length of Stay Meetings, dates discussed:   04/15/2017 Additional Comments:  Saori Umholtz, Chauncey Reading, RN 04/15/2017, 12:05 PM

## 2017-04-16 ENCOUNTER — Telehealth: Payer: Self-pay | Admitting: Gastroenterology

## 2017-04-16 NOTE — Telephone Encounter (Signed)
Thanks for letting us know. 

## 2017-04-16 NOTE — Telephone Encounter (Signed)
Wanda [name] came by the front office this morning to let everyone know that Theta is now in Hospice and he thanked all of [place] and said we did a good job.

## 2017-04-16 NOTE — Telephone Encounter (Signed)
Thank you for the update!

## 2017-04-16 NOTE — Telephone Encounter (Signed)
Noted  

## 2017-04-16 NOTE — Telephone Encounter (Signed)
Thanks for update

## 2017-04-20 NOTE — Telephone Encounter (Signed)
REVIEWED.  

## 2017-04-21 IMAGING — CT CT ABD-PELV W/O CM
2 of 4 series · 15 of 46 positions shown, 17 images · non-contrast
Comparison: CT of the abdomen and pelvis from 01/02/2016

CLINICAL DATA: Acute onset of lower abdominal and lower back pain.
Patient has been on dialysis for 2 years. Initial encounter.

EXAM:
CT ABDOMEN AND PELVIS WITHOUT CONTRAST
TECHNIQUE: Multidetector CT imaging of the abdomen and pelvis was performed
following the standard protocol without IV contrast.

[Series 2: axial st · axial · 0.79mm/px · z∈[-406,+14]mm · 12 of 96 slices shown, 14 images]
[im 8/96  soft-tissue]
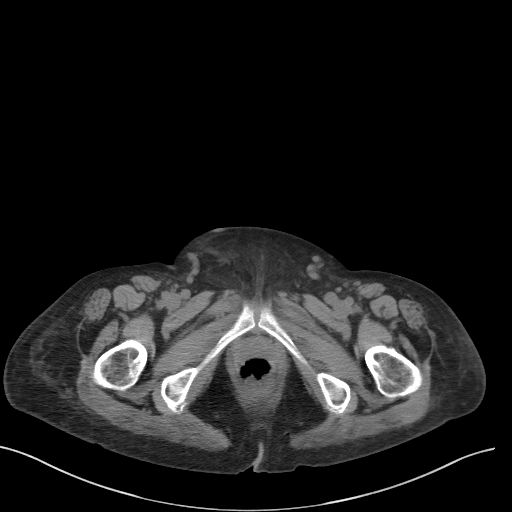
[im 8/96  bone]
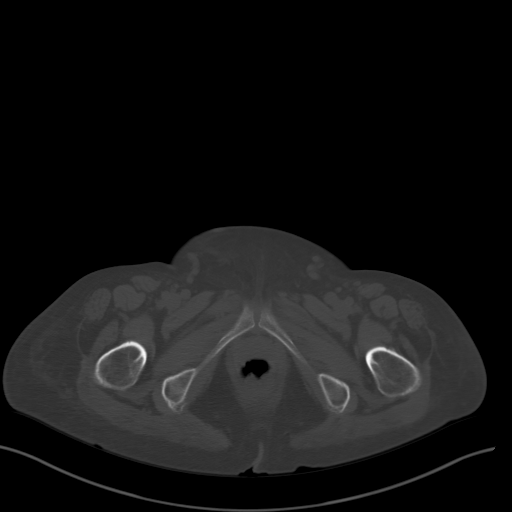
[im 16/96  soft-tissue]
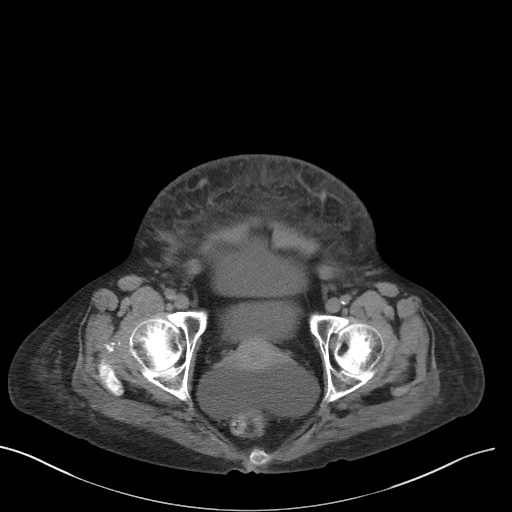
[im 23/96  soft-tissue]
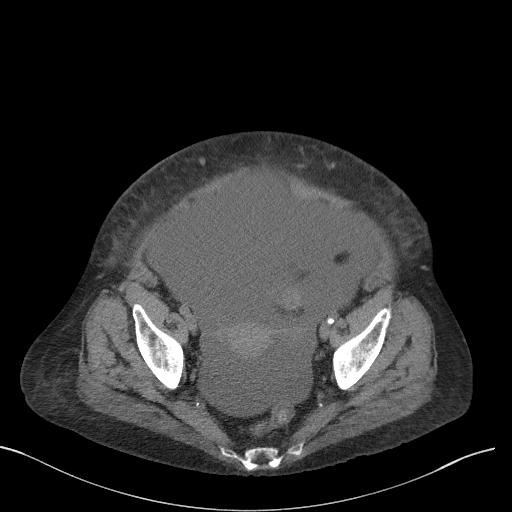
[im 31/96  soft-tissue]
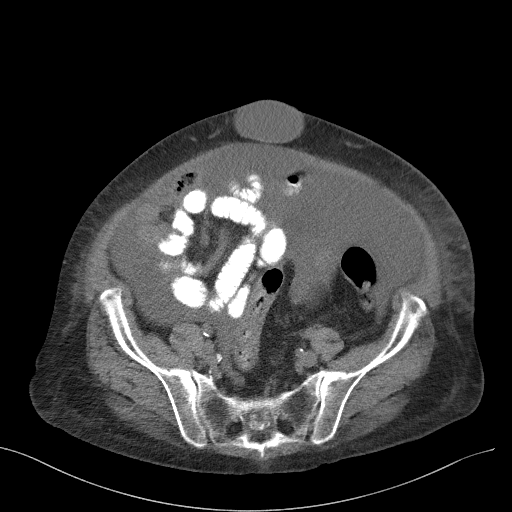
[im 39/96  soft-tissue]
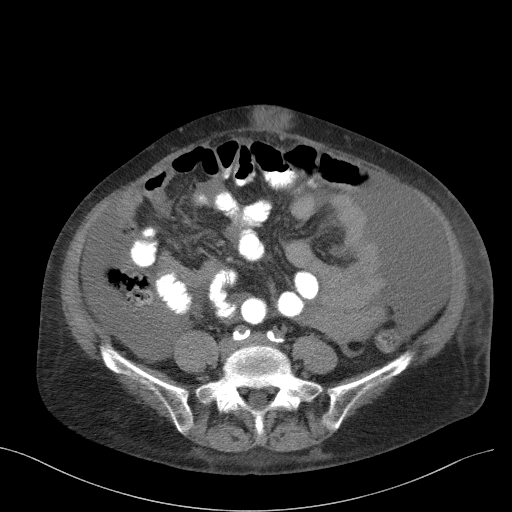
[im 46/96  soft-tissue]
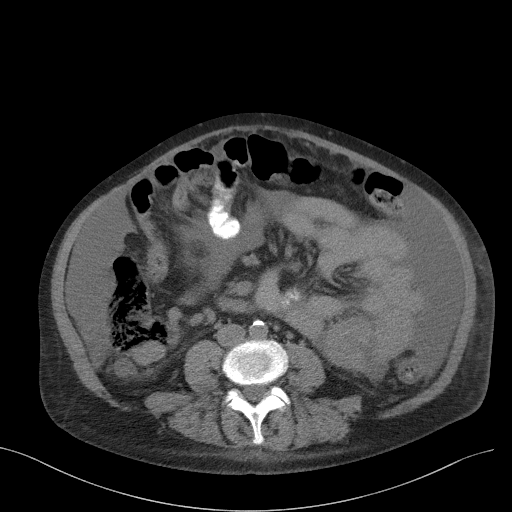
[im 54/96  soft-tissue]
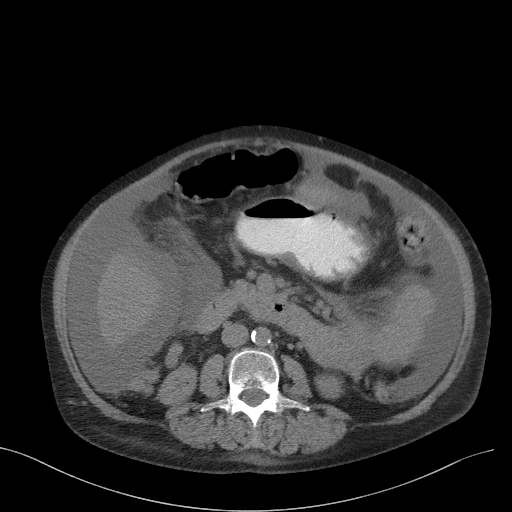
[im 61/96  soft-tissue]
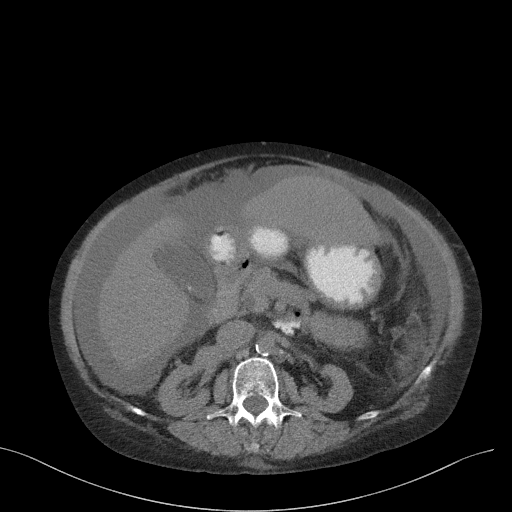
[im 69/96  soft-tissue]
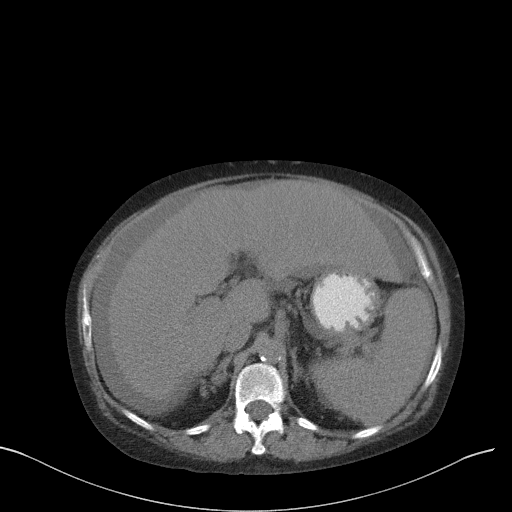
[im 69/96  bone]
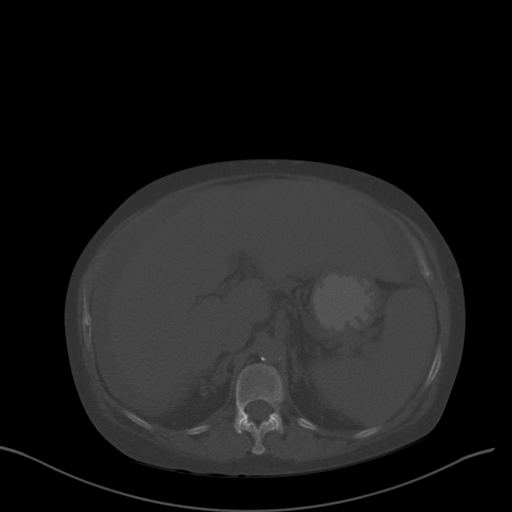
[im 77/96  soft-tissue]
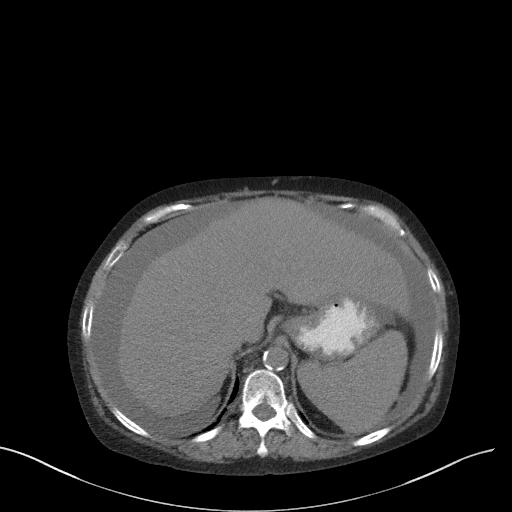
[im 84/96  soft-tissue]
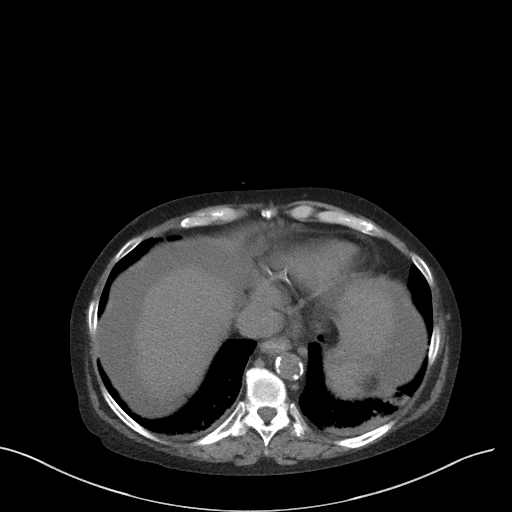
[im 92/96  soft-tissue]
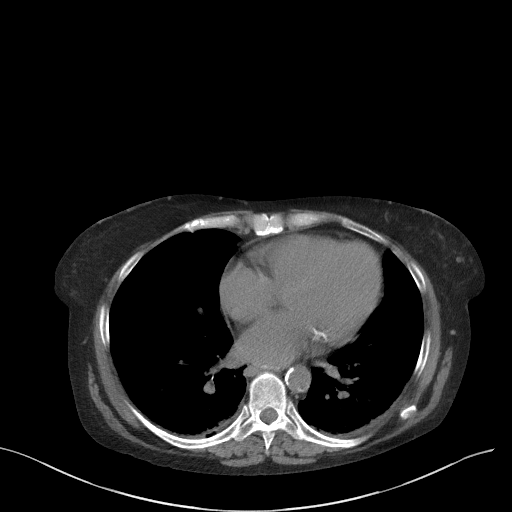

[Series 5: coronal st · coronal · 0.79mm/px · 3 of 100 slices shown]
[im 34/100  soft-tissue]
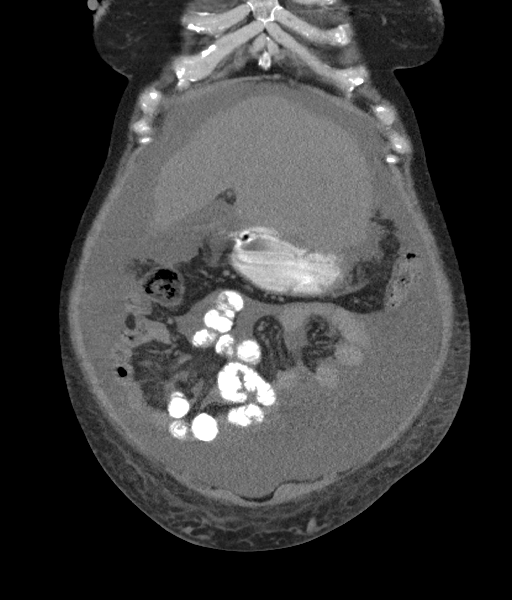
[im 45/100  soft-tissue]
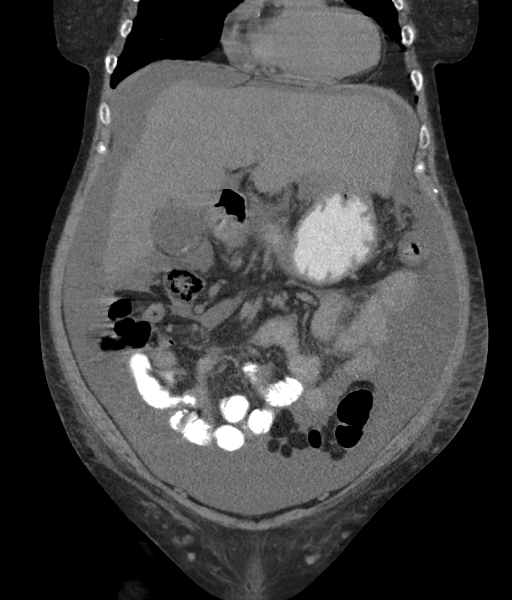
[im 56/100  soft-tissue]
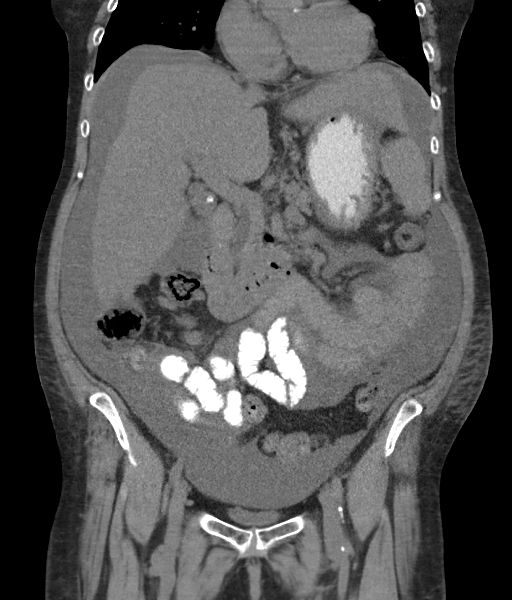

[15 of 46 positions shown; findings below may reference images not displayed]

FINDINGS: Lower chest: Mild bibasilar atelectasis is noted. Diffuse coronary
artery calcifications are seen.

Hepatobiliary: There is a nodular contour to the liver, compatible
with the patient's known hepatic cirrhosis. Evaluation for hepatic
masses is limited without contrast. Stones are seen dependently
within the gallbladder. The gallbladder is otherwise unremarkable,
though difficult to fully assess due to surrounding ascites.

The common bile duct is mildly dilated, measuring 9 mm, without
definite evidence of distal obstruction on CT.

Pancreas: The pancreas is unremarkable in appearance.

Spleen: The spleen is grossly unremarkable. Mildly prominent splenic
varices are noted.

Adrenals/Urinary Tract: The adrenal glands are grossly unremarkable
in appearance.

Chronic bilateral renal atrophy is noted. Large varices are seen
tracking to the right side of the right kidney. There is no evidence
of hydronephrosis. No renal or ureteral stones are identified.
Minimal right-sided perinephric stranding is seen.

Stomach/Bowel: The stomach is unremarkable in appearance. The small
bowel is within normal limits. The appendix is not visualized; there
is no evidence for appendicitis. The colon is unremarkable in
appearance.

Mildly prominent gastric varices are noted.

Vascular/Lymphatic: Scattered calcification is seen along the
abdominal aorta and its branches. No definite retroperitoneal or
pelvic sidewall lymphadenopathy is seen. There is mild
recanalization of the umbilical vein.

Reproductive: The bladder is mildly distended and grossly
unremarkable. The uterus is unremarkable. The ovaries are relatively
symmetric. No suspicious adnexal masses are seen.

Other: Moderate to large volume ascites is seen within the abdomen
and pelvis. Note is made of a small to moderate umbilical hernia,
containing fluid and fat.

Musculoskeletal: No acute osseous abnormalities are identified. The
visualized musculature is unremarkable in appearance.
IMPRESSION: 1. No acute abnormality seen to explain the patient's symptoms.
2. Moderate to large volume ascites within the abdomen and pelvis.
3. Cholelithiasis. Gallbladder otherwise unremarkable. Mild
dilatation of the common bile duct to 9 mm. This appears grossly
stable from 2753 and may reflect the patient's baseline. Would
correlate for any associated symptoms.
4. Findings of hepatic cirrhosis. Recanalization of the umbilical
vein, and mildly prominent gastric and splenic varices.
5. Chronic bilateral renal atrophy. Large varices to the right side
of the right kidney.
6. Diffuse coronary artery calcifications.
7. Scattered aortic atherosclerosis.
8. Small to moderate umbilical hernia, containing fluid and fat.
9. Mild bibasilar atelectasis noted.

## 2017-04-24 IMAGING — CT CT HEAD W/O CM
3 series · 16 of 46 positions shown, 19 images · non-contrast
Comparison: November 05, 2013

CLINICAL DATA: Altered mental status/confusion

EXAM:
CT HEAD WITHOUT CONTRAST
TECHNIQUE: Contiguous axial images were obtained from the base of the skull
through the vertex without intravenous contrast.

[Series 2: head wo · axial · 0.39mm/px · z∈[+1635,+1755]mm · 10 of 29 slices shown, 13 images]
[im 3/29  brain]
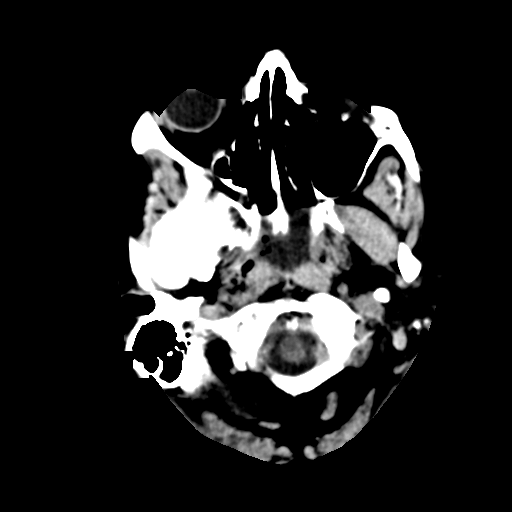
[im 3/29  bone]
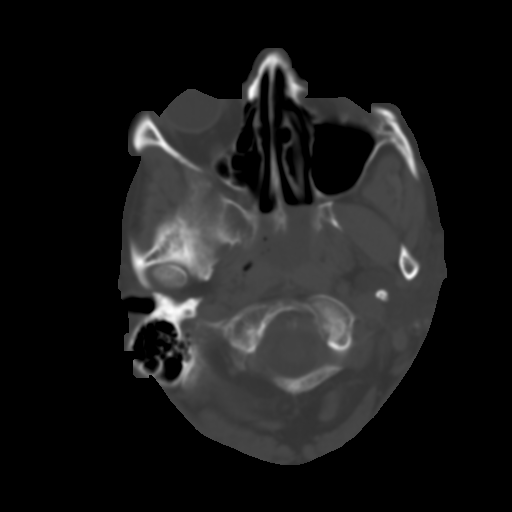
[im 6/29  brain]
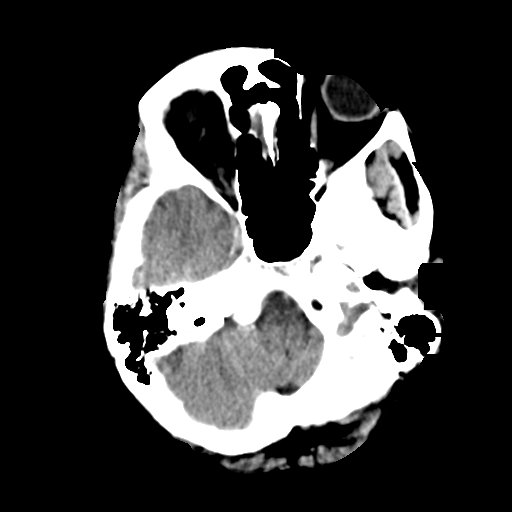
[im 8/29  brain]
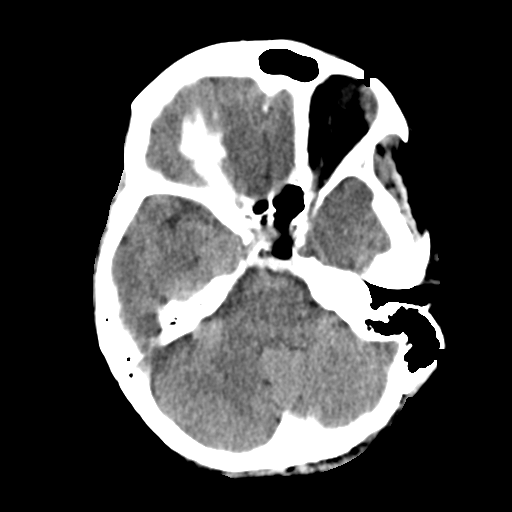
[im 11/29  brain]
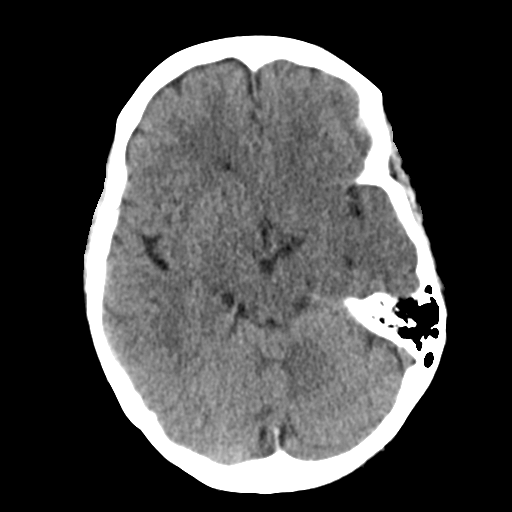
[im 14/29  brain]
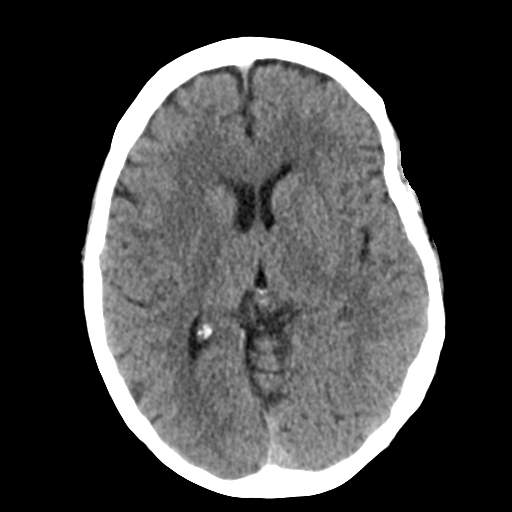
[im 14/29  bone]
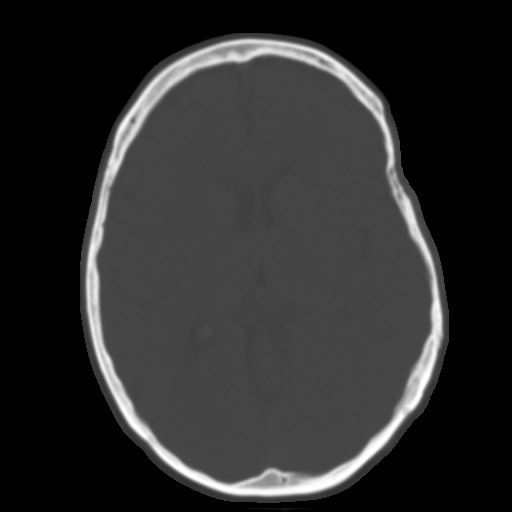
[im 16/29  brain]
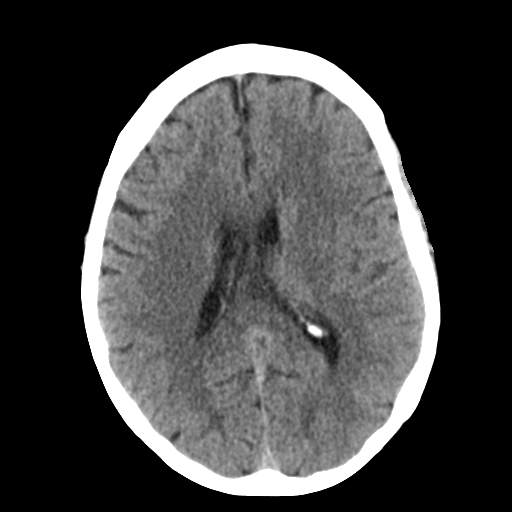
[im 19/29  brain]
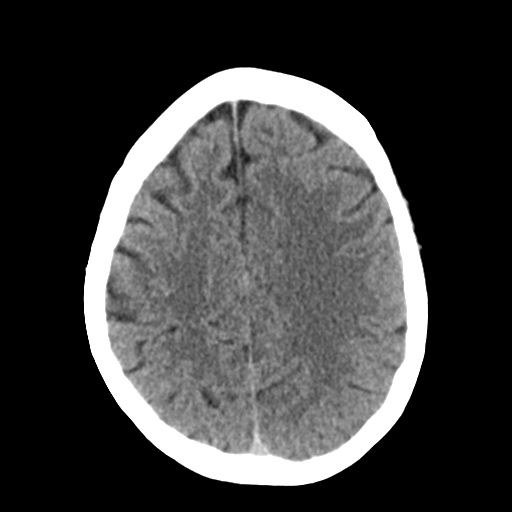
[im 22/29  brain]
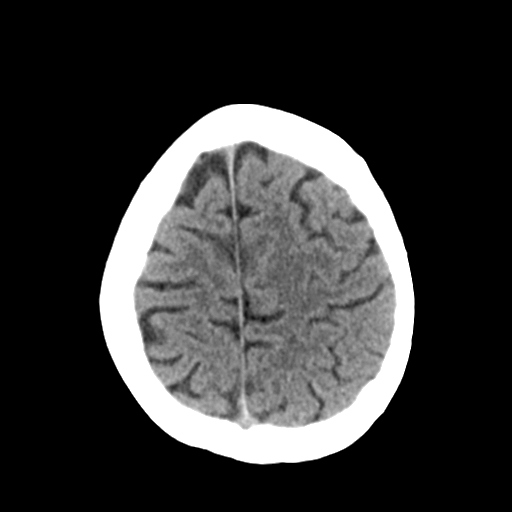
[im 24/29  brain]
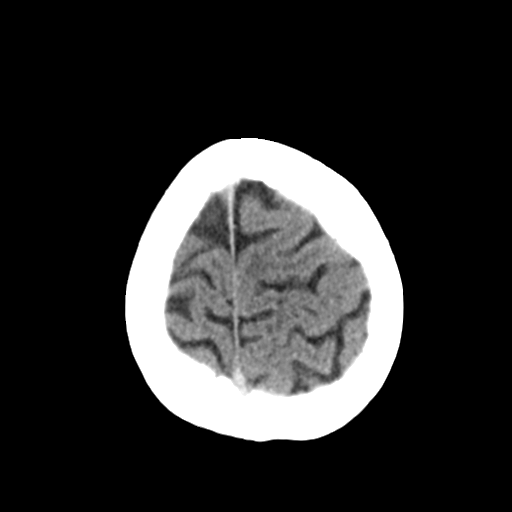
[im 24/29  bone]
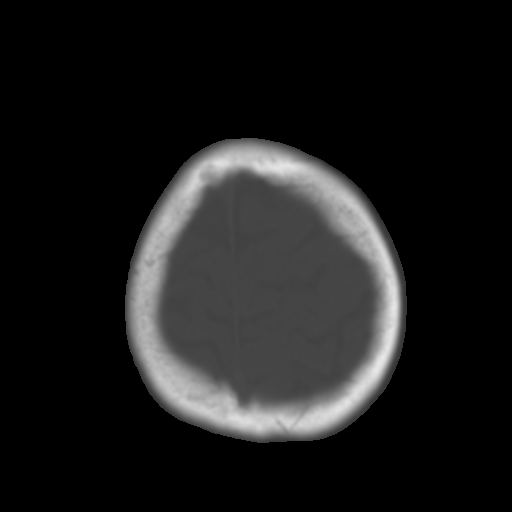
[im 27/29  brain]
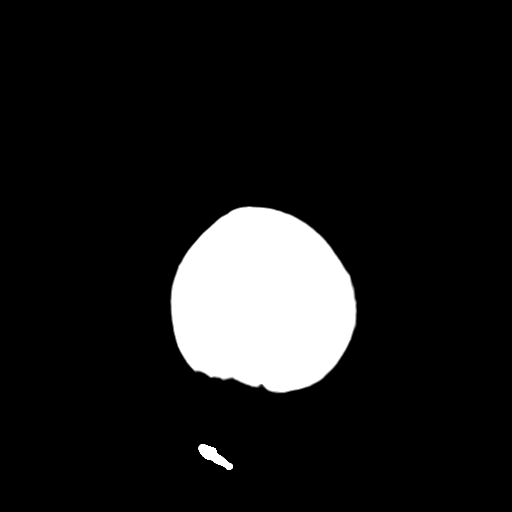

[Series 4: coronal soft tissue · coronal · 0.30mm/px · 3 of 67 slices shown]
[im 23/67  brain]
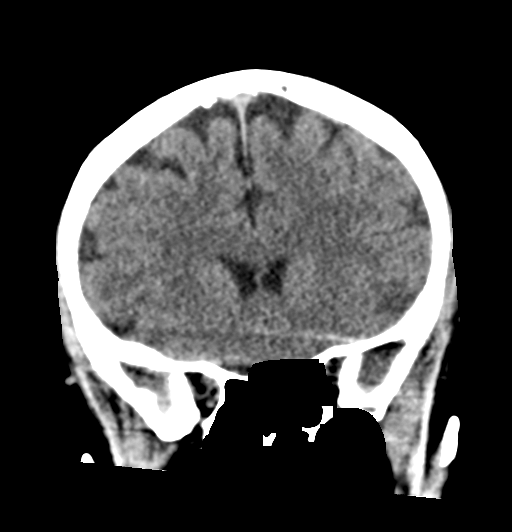
[im 30/67  brain]
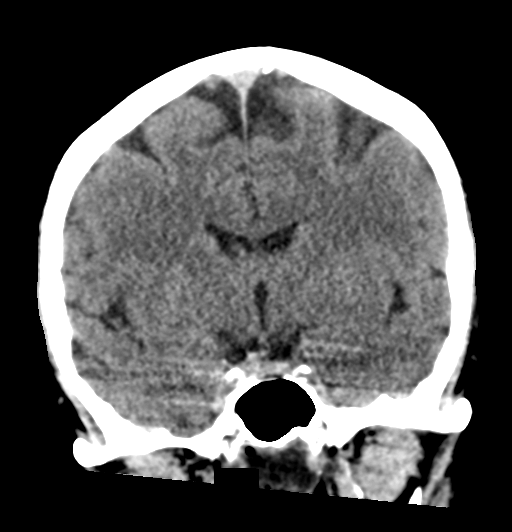
[im 37/67  brain]
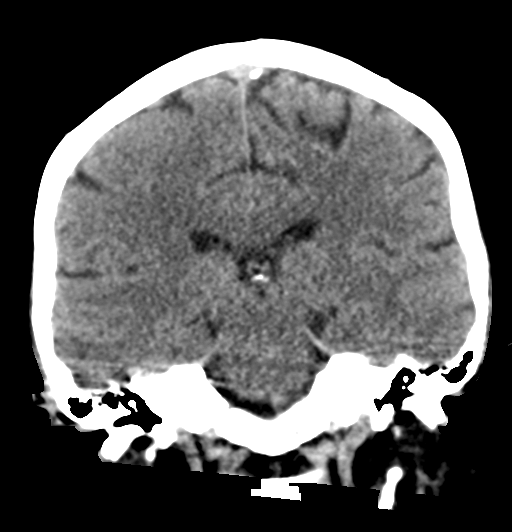

[Series 5: sagittal soft tissue · sagittal · 0.30mm/px · 3 of 52 slices shown]
[im 18/52  brain]
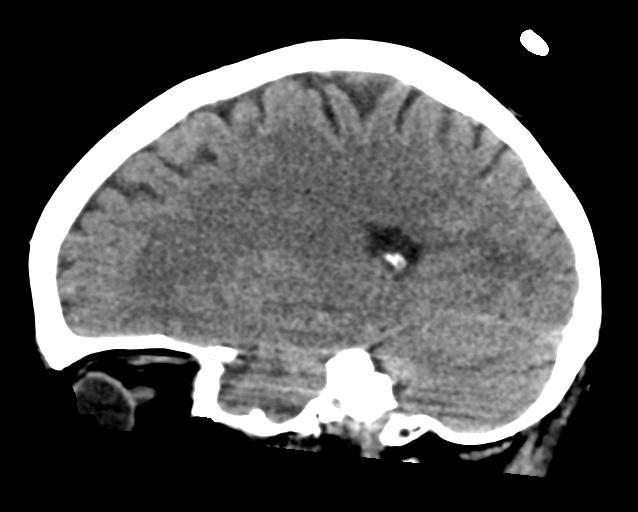
[im 26/52  brain]
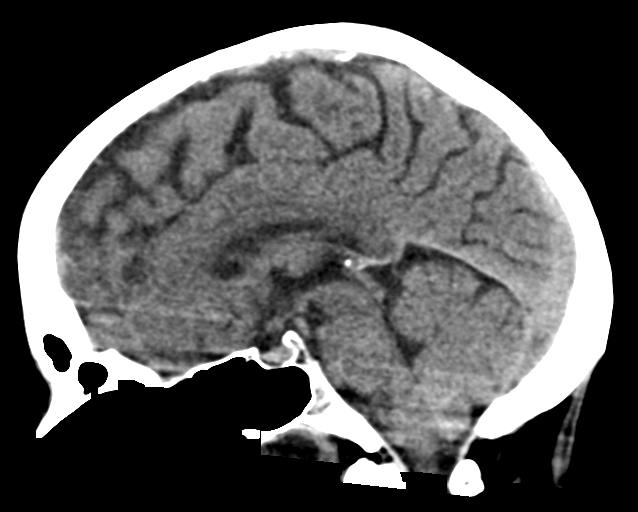
[im 35/52  brain]
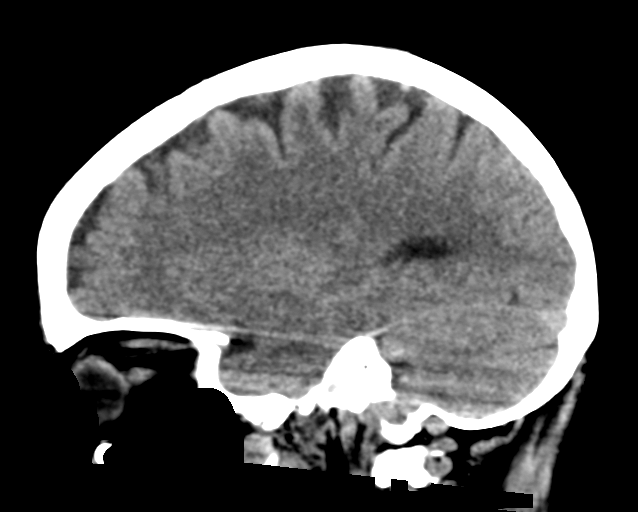

[16 of 46 positions shown; findings below may reference images not displayed]

FINDINGS: Brain: The ventricles are normal in size and configuration. There is
no intracranial mass, hemorrhage, extra-axial fluid collection, or
midline shift. Gray-white compartments appear normal. No acute
infarct evident.

Vascular: No hyperdense vessel. There is calcification in each
carotid siphon region.

Skull:  Bony calvarium appears intact.

Sinuses/Orbits: Visualized paranasal sinuses are clear. Visualized
orbits appear symmetric bilaterally.

Other:  Visualized mastoid air cells are clear.
IMPRESSION: Calcification in each carotid siphon region. No intracranial mass,
hemorrhage, or extra-axial fluid collection. Gray-white compartments
appear normal.

## 2017-04-26 IMAGING — CR DG CHEST 1V PORT
1 series · 1 of 1 positions shown · non-contrast
Comparison: 09/23/2016.

CLINICAL DATA: ET tube placement.

EXAM:
PORTABLE CHEST 1 VIEW

[AP]
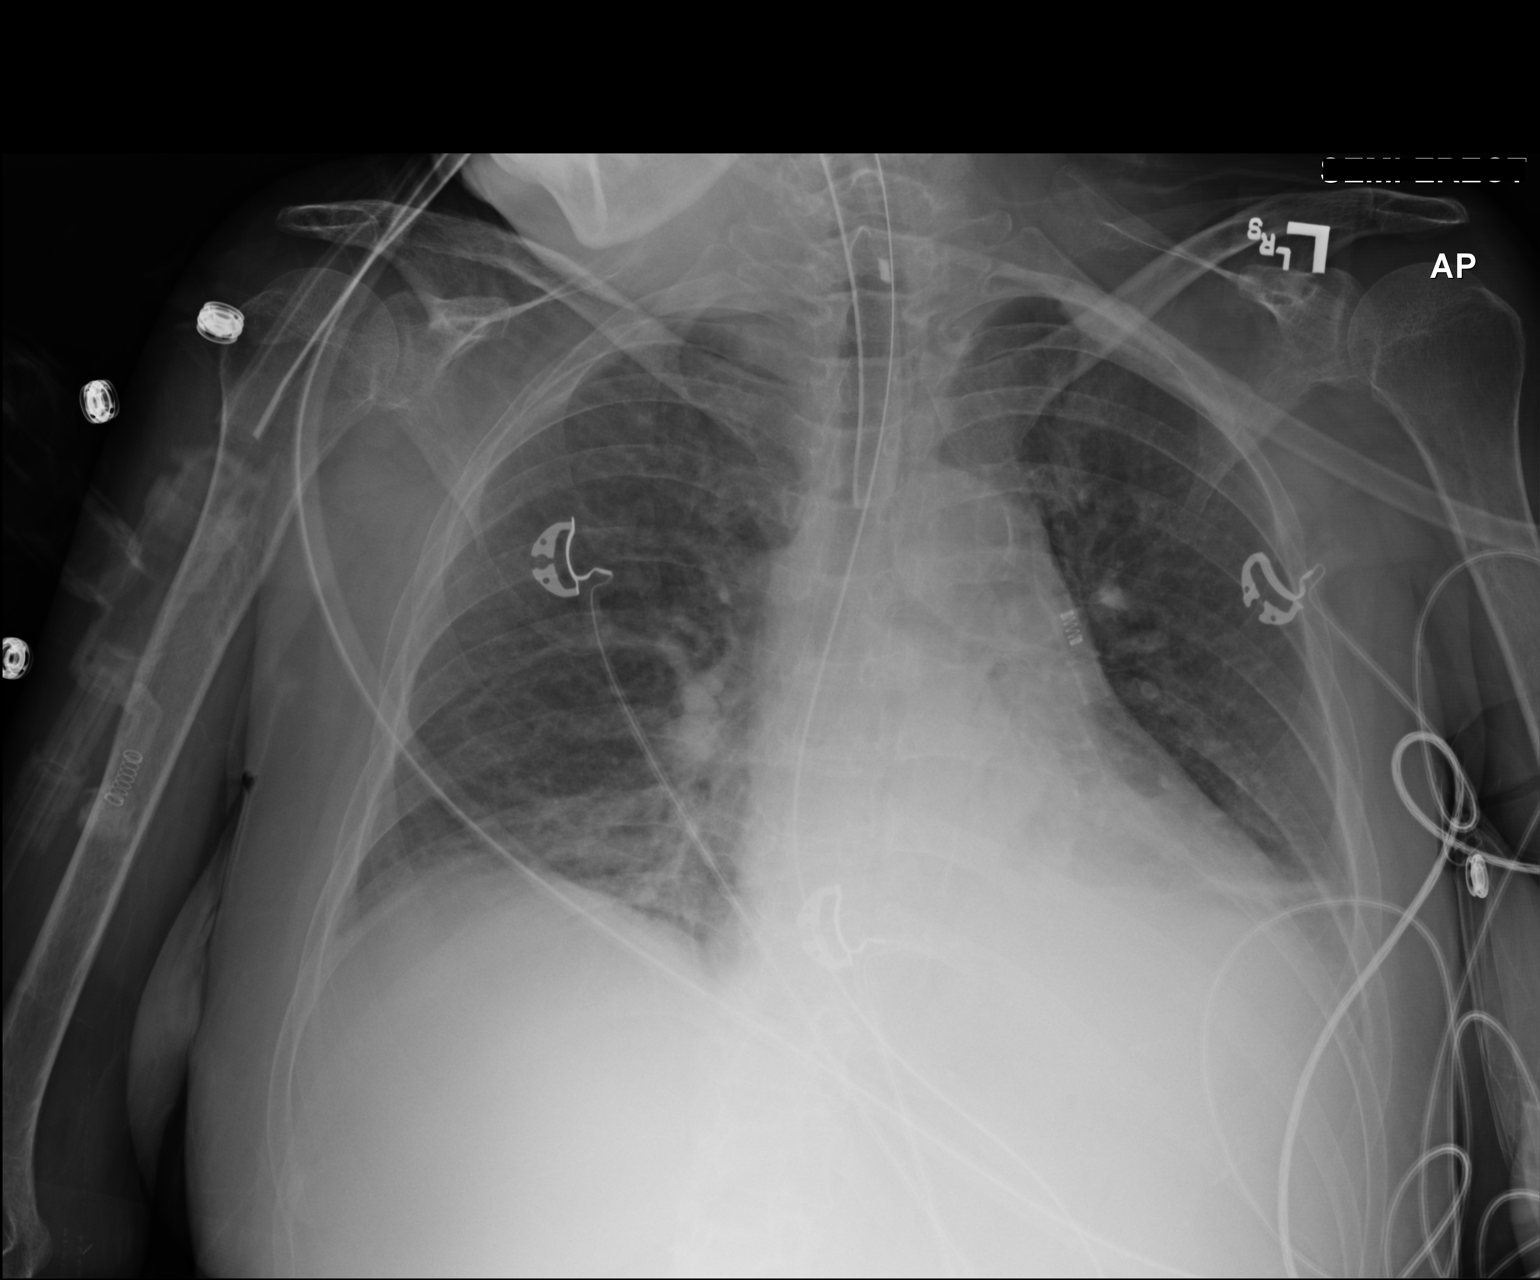

[1 of 1 positions shown; findings below may reference images not displayed]

FINDINGS: ET tube 3 cm above carina. The heart is enlarged. Moderate-size LEFT
effusion is slightly improved. Bibasilar subsegmental atelectasis.
Increasing opacity at the RIGHT lung base could represent developing
pneumonia. Overall there is slightly less retrocardiac density,
which may be due to decreasing LEFT effusion.
IMPRESSION: Slight worsening aeration in the RIGHT lower lung zone could
represent developing pneumonia. Slight improvement LEFT pleural
effusion.

## 2017-04-27 IMAGING — CR DG CHEST 1V PORT
1 series · 1 of 1 positions shown · non-contrast
Comparison: 09/24/2016

CLINICAL DATA: Pneumonia

EXAM:
PORTABLE CHEST 1 VIEW

[AP]
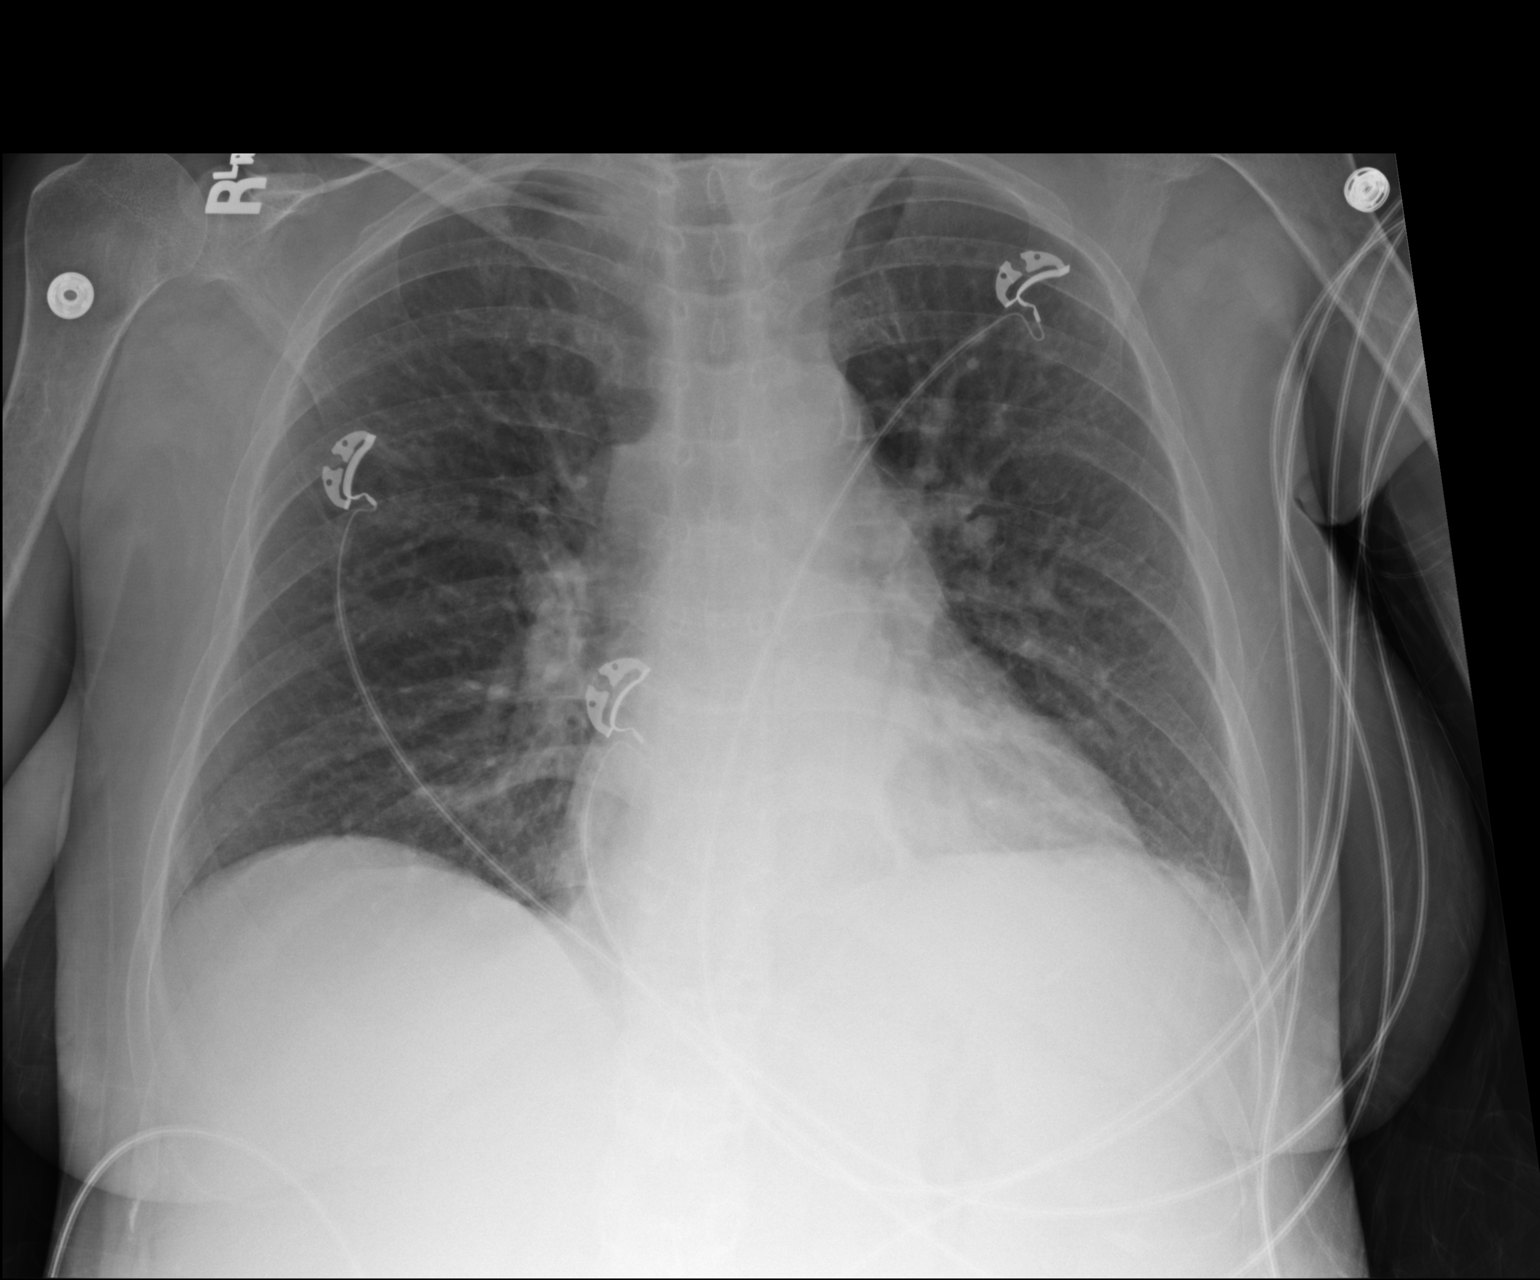

[1 of 1 positions shown; findings below may reference images not displayed]

FINDINGS: The heart size and mediastinal contours are within normal limits.
Both lungs are clear. The visualized skeletal structures are
unremarkable.
IMPRESSION: No acute abnormality noted.

## 2017-05-09 IMAGING — CT CT HEAD W/O CM
3 series · 15 of 46 positions shown, 18 images · non-contrast
Comparison: September 22, 2016

CLINICAL DATA: Seizure activity.  End-stage renal disease

EXAM:
CT HEAD WITHOUT CONTRAST
TECHNIQUE: Contiguous axial images were obtained from the base of the skull
through the vertex without intravenous contrast.

[Series 2: head trauma wo · axial · 0.41mm/px · z∈[+1351,+1471]mm · 9 of 29 slices shown, 12 images]
[im 3/29  brain]
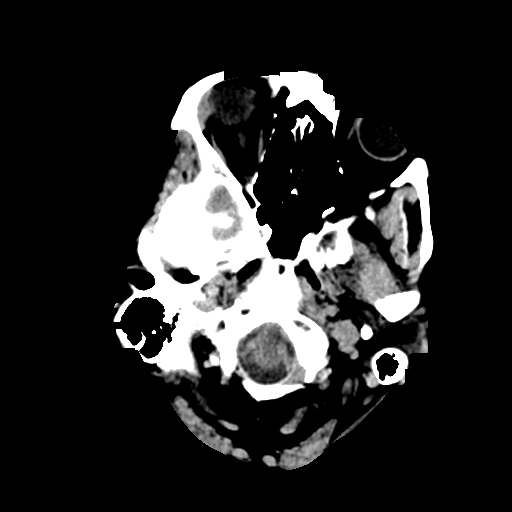
[im 3/29  bone]
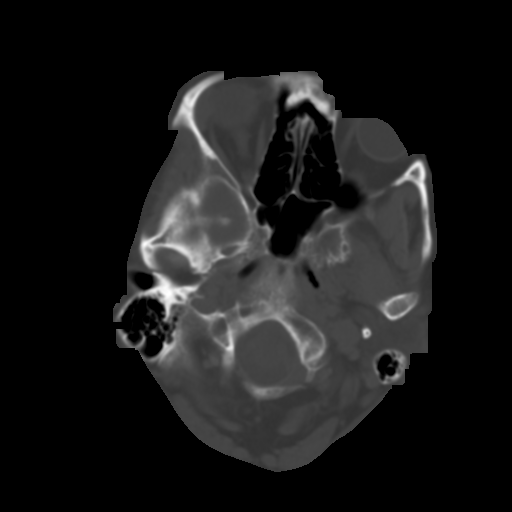
[im 6/29  brain]
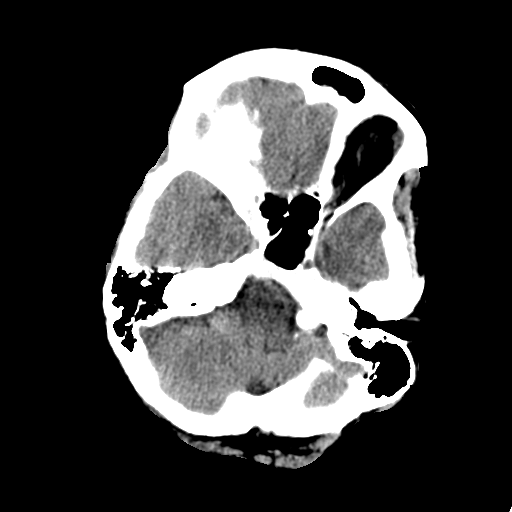
[im 9/29  brain]
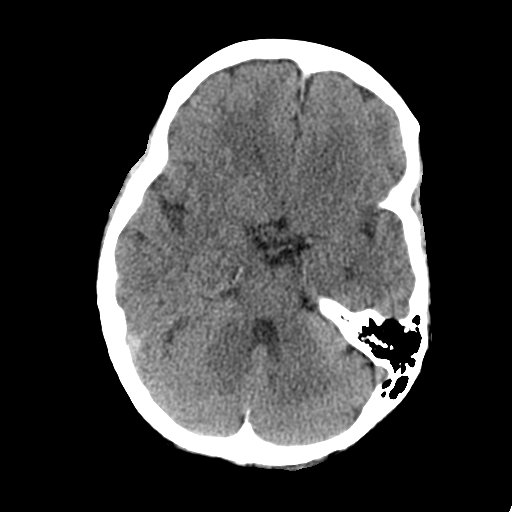
[im 12/29  brain]
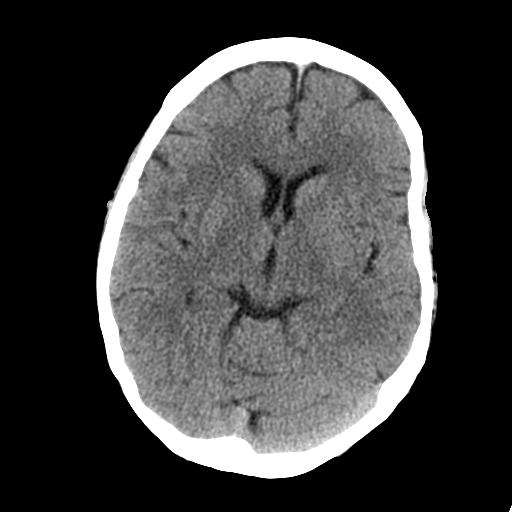
[im 15/29  brain]
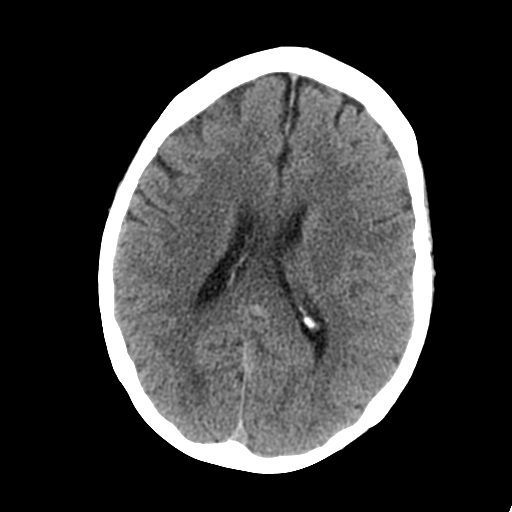
[im 15/29  bone]
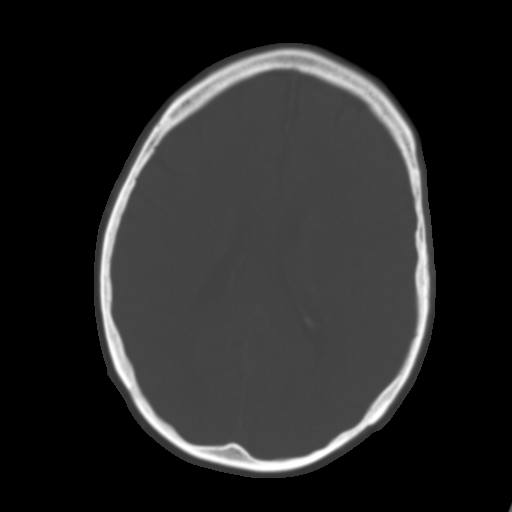
[im 18/29  brain]
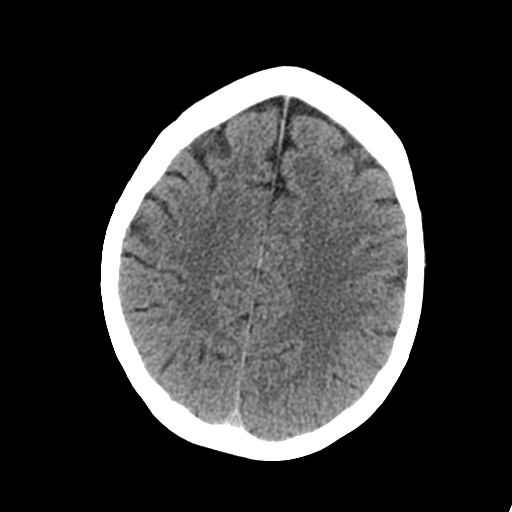
[im 21/29  brain]
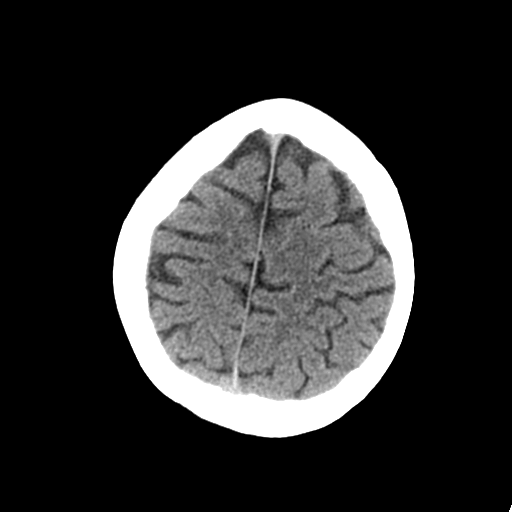
[im 24/29  brain]
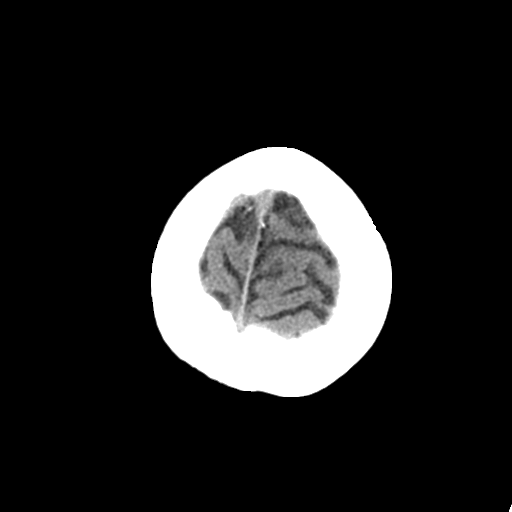
[im 27/29  brain]
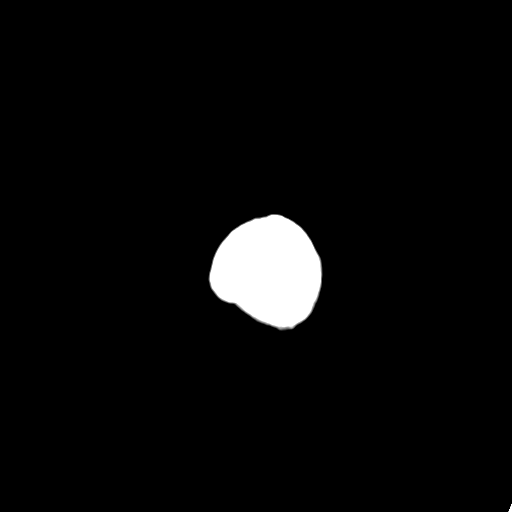
[im 27/29  bone]
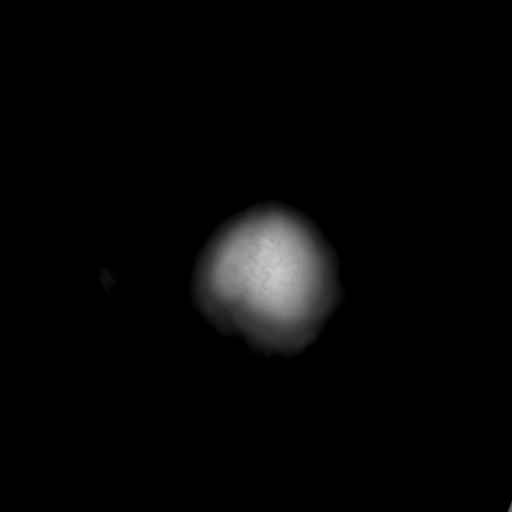

[Series 4: coronal soft tissue · coronal · 0.28mm/px · 3 of 63 slices shown]
[im 21/63  brain]
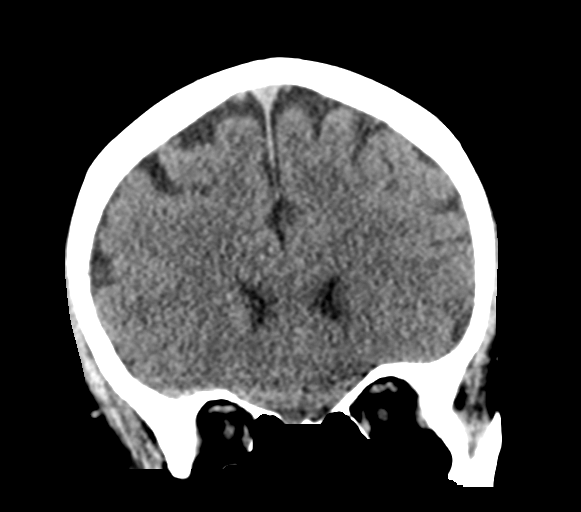
[im 28/63  brain]
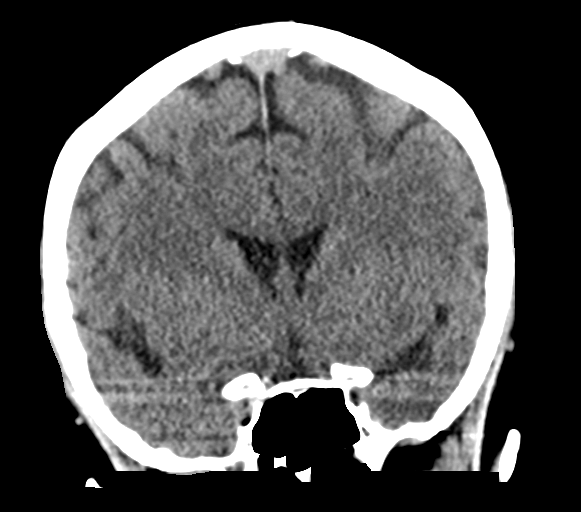
[im 35/63  brain]
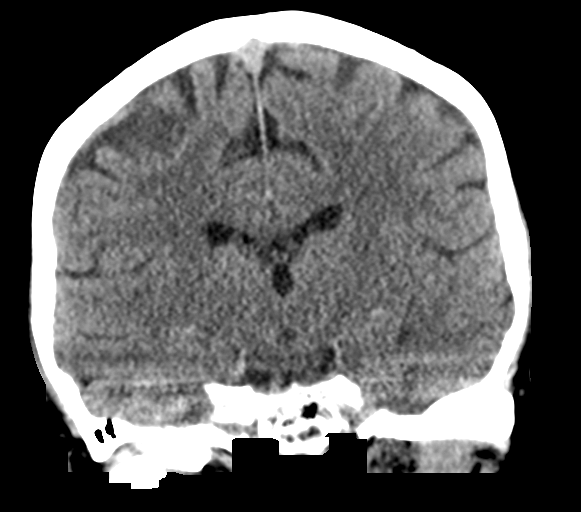

[Series 5: sagittal soft tissue · sagittal · 0.29mm/px · 3 of 51 slices shown]
[im 17/51  brain]
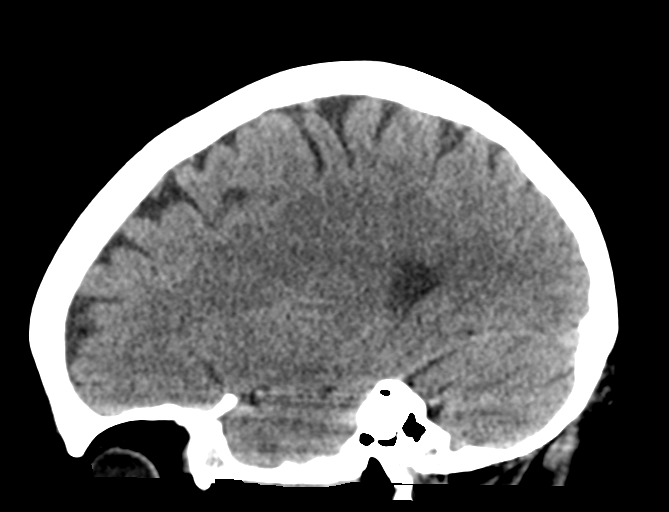
[im 26/51  brain]
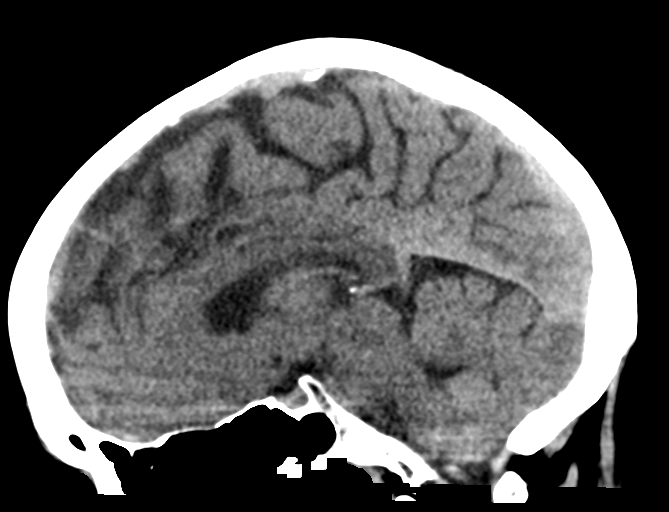
[im 34/51  brain]
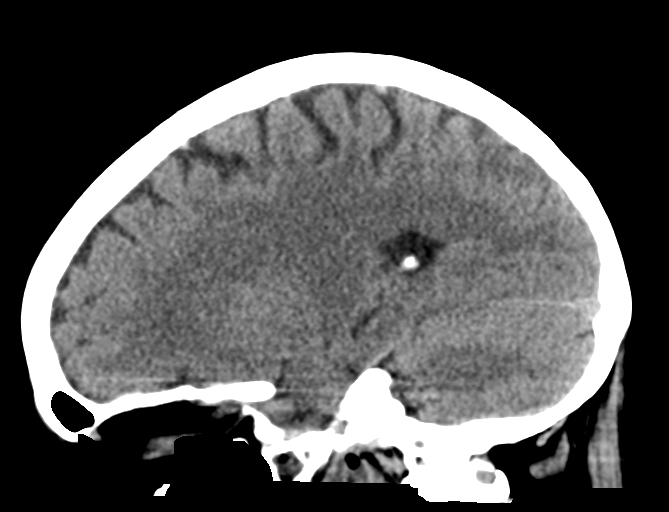

[15 of 46 positions shown; findings below may reference images not displayed]

FINDINGS: Brain: The ventricles are normal in size and configuration. There is
no intracranial mass, hemorrhage, extra-axial fluid collection, or
midline shift gray-white compartments are normal. No acute infarct
evident.

Vascular: No hyperdense vessel. Calcification is noted in the
carotid siphon regions bilaterally.

Skull: The bony calvarium appears intact.

Sinuses/Orbits: Visualized paranasal sinuses are clear. Visualized
orbits appear symmetric bilaterally.

Other: Visualized mastoid air cells are clear.
IMPRESSION: Vascular calcification in the carotid siphon regions. No
intracranial mass, hemorrhage, or extra-axial fluid collection.
Gray-white compartments are normal.

## 2017-05-09 IMAGING — CR DG CHEST 1V PORT
1 series · 1 of 1 positions shown · non-contrast
Comparison: 09/25/2016 .

CLINICAL DATA: Dialysis.  Unresponsive .

EXAM:
PORTABLE CHEST 1 VIEW

[ap portable]
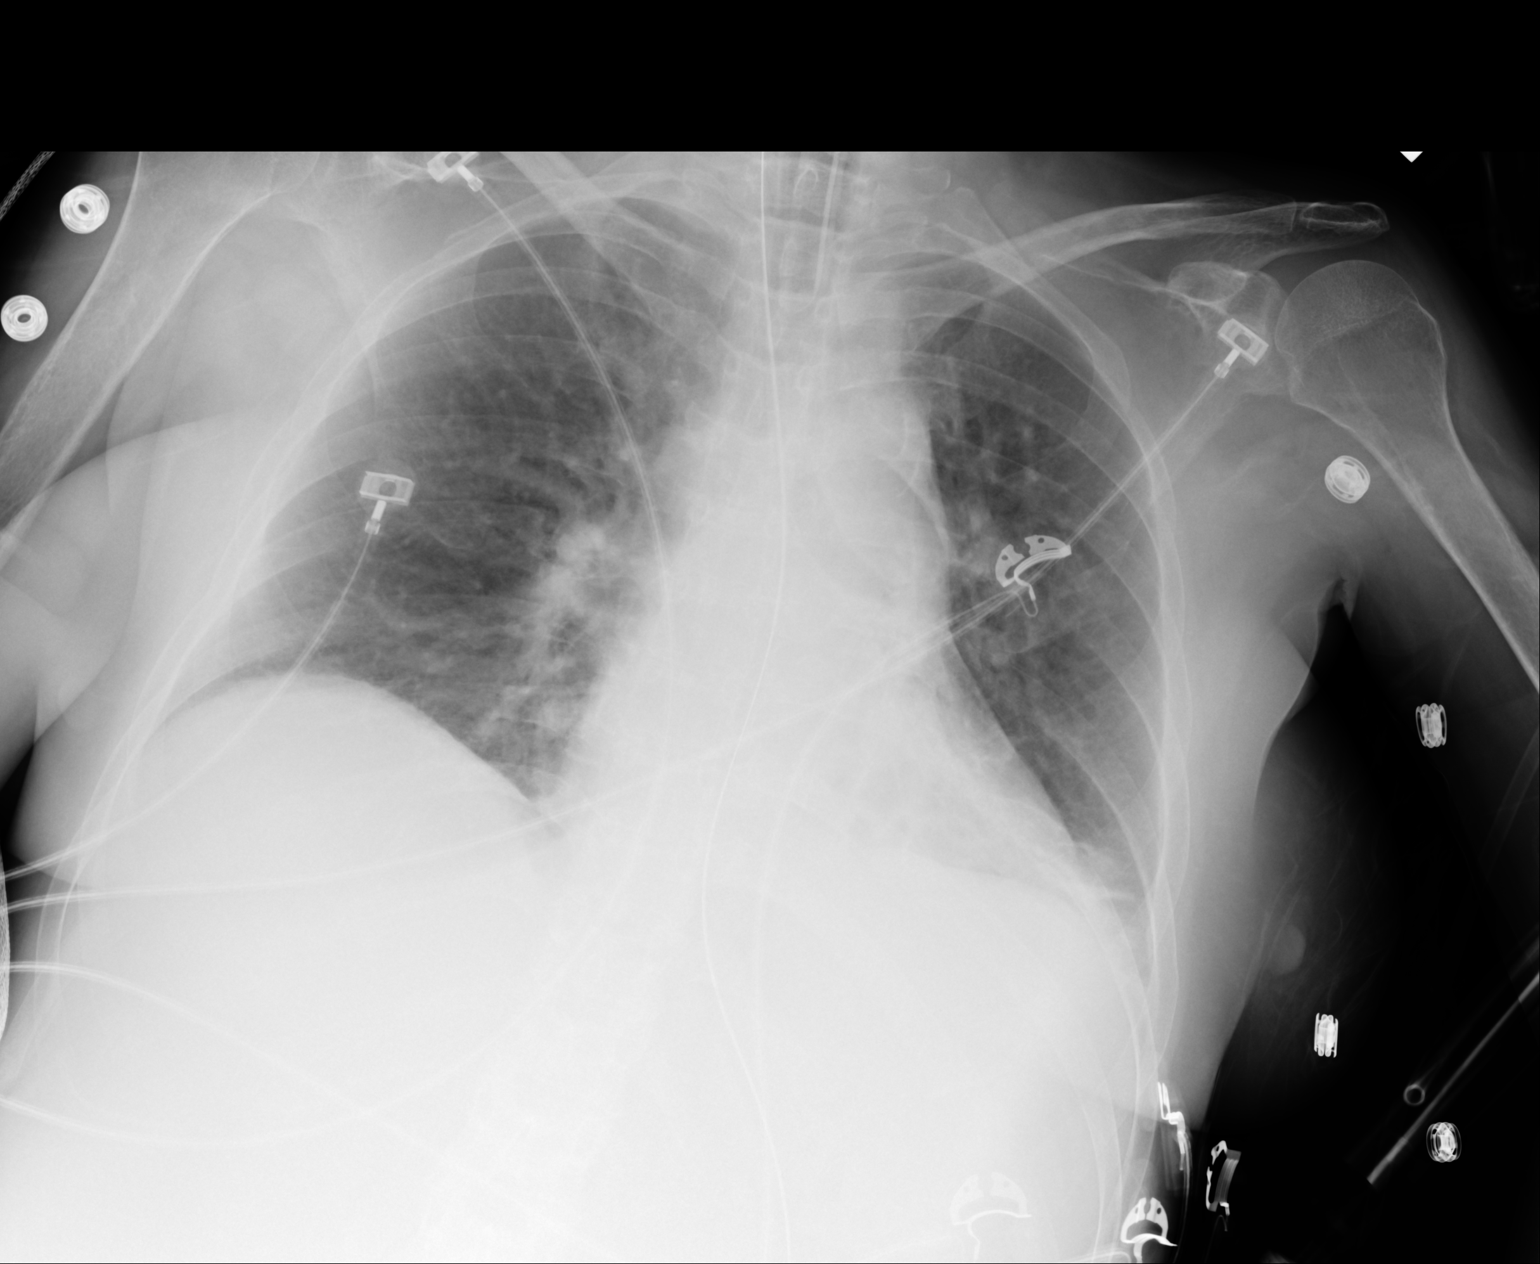

[1 of 1 positions shown; findings below may reference images not displayed]

FINDINGS: Endotracheal tube noted with tip 3.7 cm above the carina. NG tube
noted with tip below left hemidiaphragm. Mild cardiomegaly with mild
pulmonary vascular prominence. Left base subsegmental atelectasis.
Small left pleural effusion cannot be excluded. No pneumothorax .
IMPRESSION: 1.  Endotracheal tube and NG tube in stable position.

2.  Cardiomegaly with mild pulmonary vascular prominence.

3. Mild left base subsegmental atelectasis. Small left pleural
effusion cannot be excluded .

## 2017-05-09 IMAGING — CR DG CHEST 1V PORT
1 series · 1 of 1 positions shown · non-contrast
Comparison: 10/07/2016

CLINICAL DATA: Respiratory failure. Check endotracheal tube
position.

EXAM:
PORTABLE CHEST 1 VIEW

[AP]
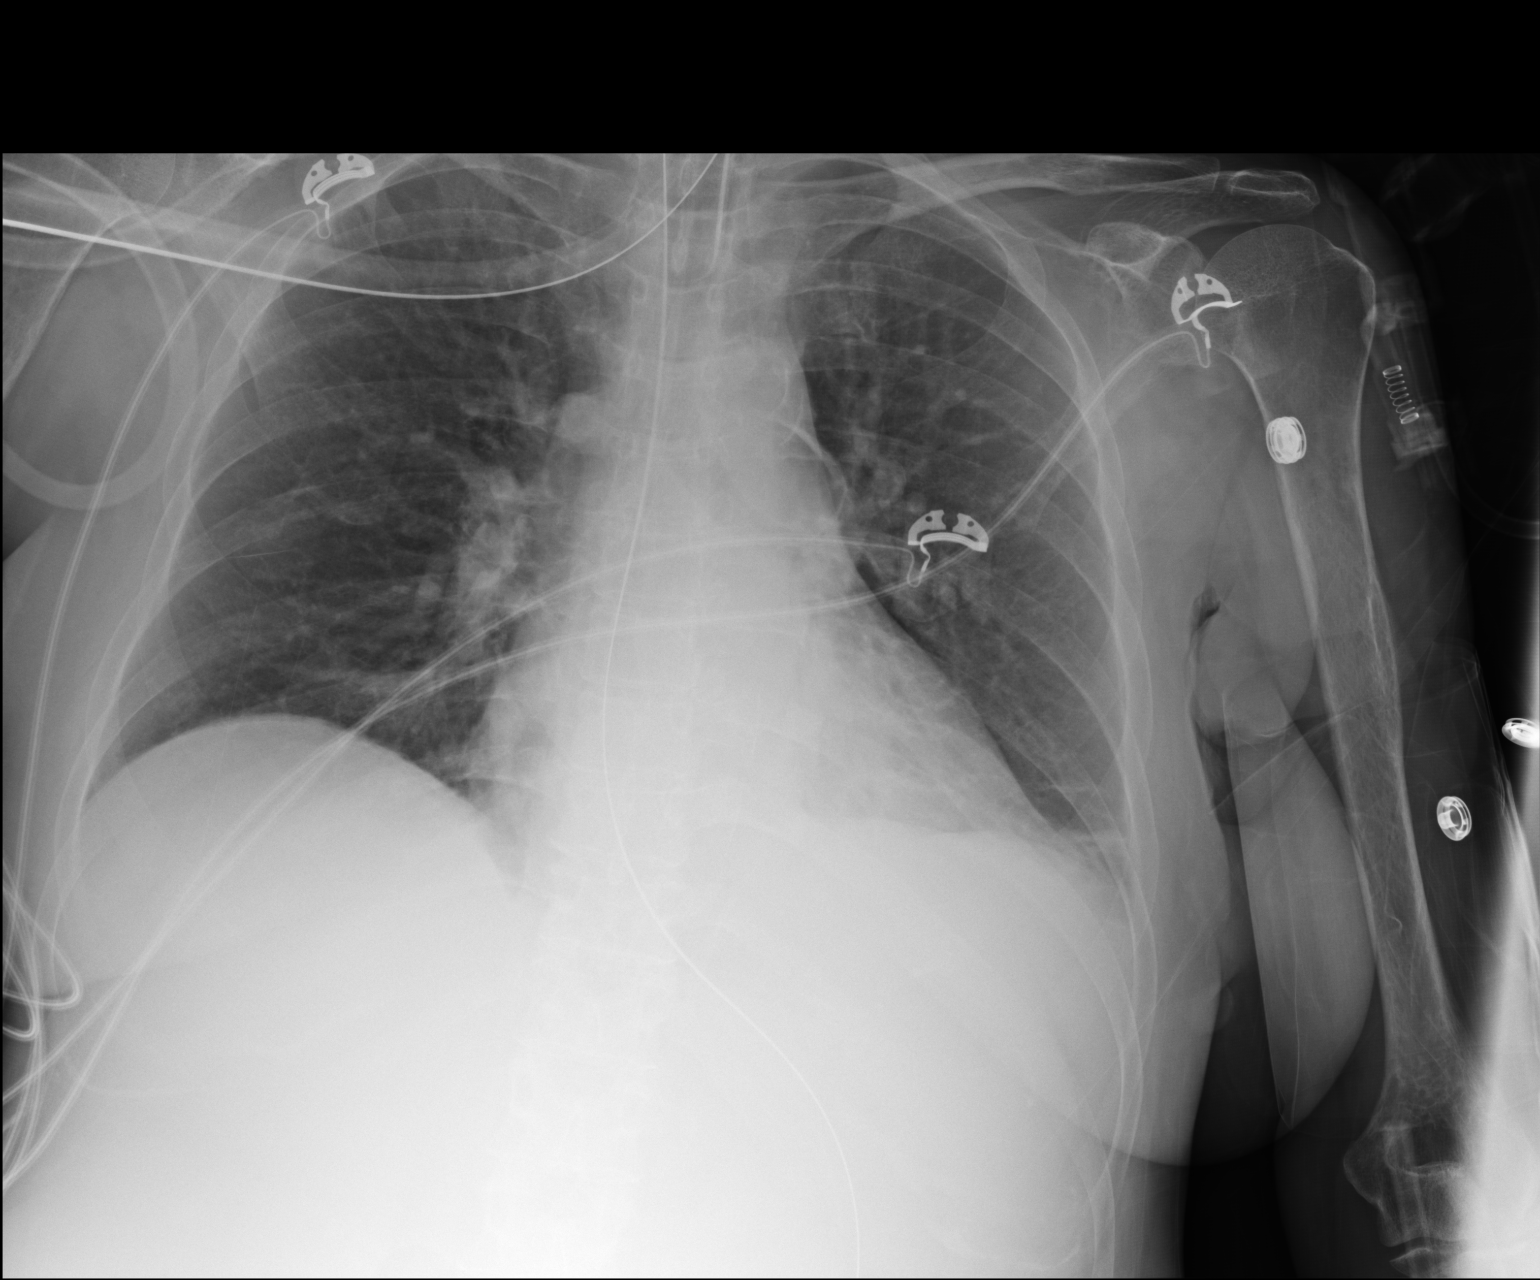

[1 of 1 positions shown; findings below may reference images not displayed]

FINDINGS: The tip of an endotracheal tube is 5 cm above the carina. Gastric
tube extends into the expected location of the stomach. The tip is
excluded on this exam. The heart and mediastinal contours are stable
with aortic atherosclerosis. Subsegmental atelectasis and/or
scarring at the left lung base. Trace left effusion. Mild central
pulmonary vascular congestion as before.
IMPRESSION: 1. Satisfactory endotracheal and gastric tube positions.
2. Aortic atherosclerosis.
3. Mild central vascular congestion.
4. Left basilar subsegmental atelectasis and/or scarring.
5. Trace left effusion.

## 2017-05-10 IMAGING — CR DG CHEST 1V PORT
1 series · 1 of 1 positions shown · non-contrast
Comparison: 10/07/2016.

CLINICAL DATA: Respiratory failure.  Shortness of breath .

EXAM:
PORTABLE CHEST 1 VIEW

[AP]
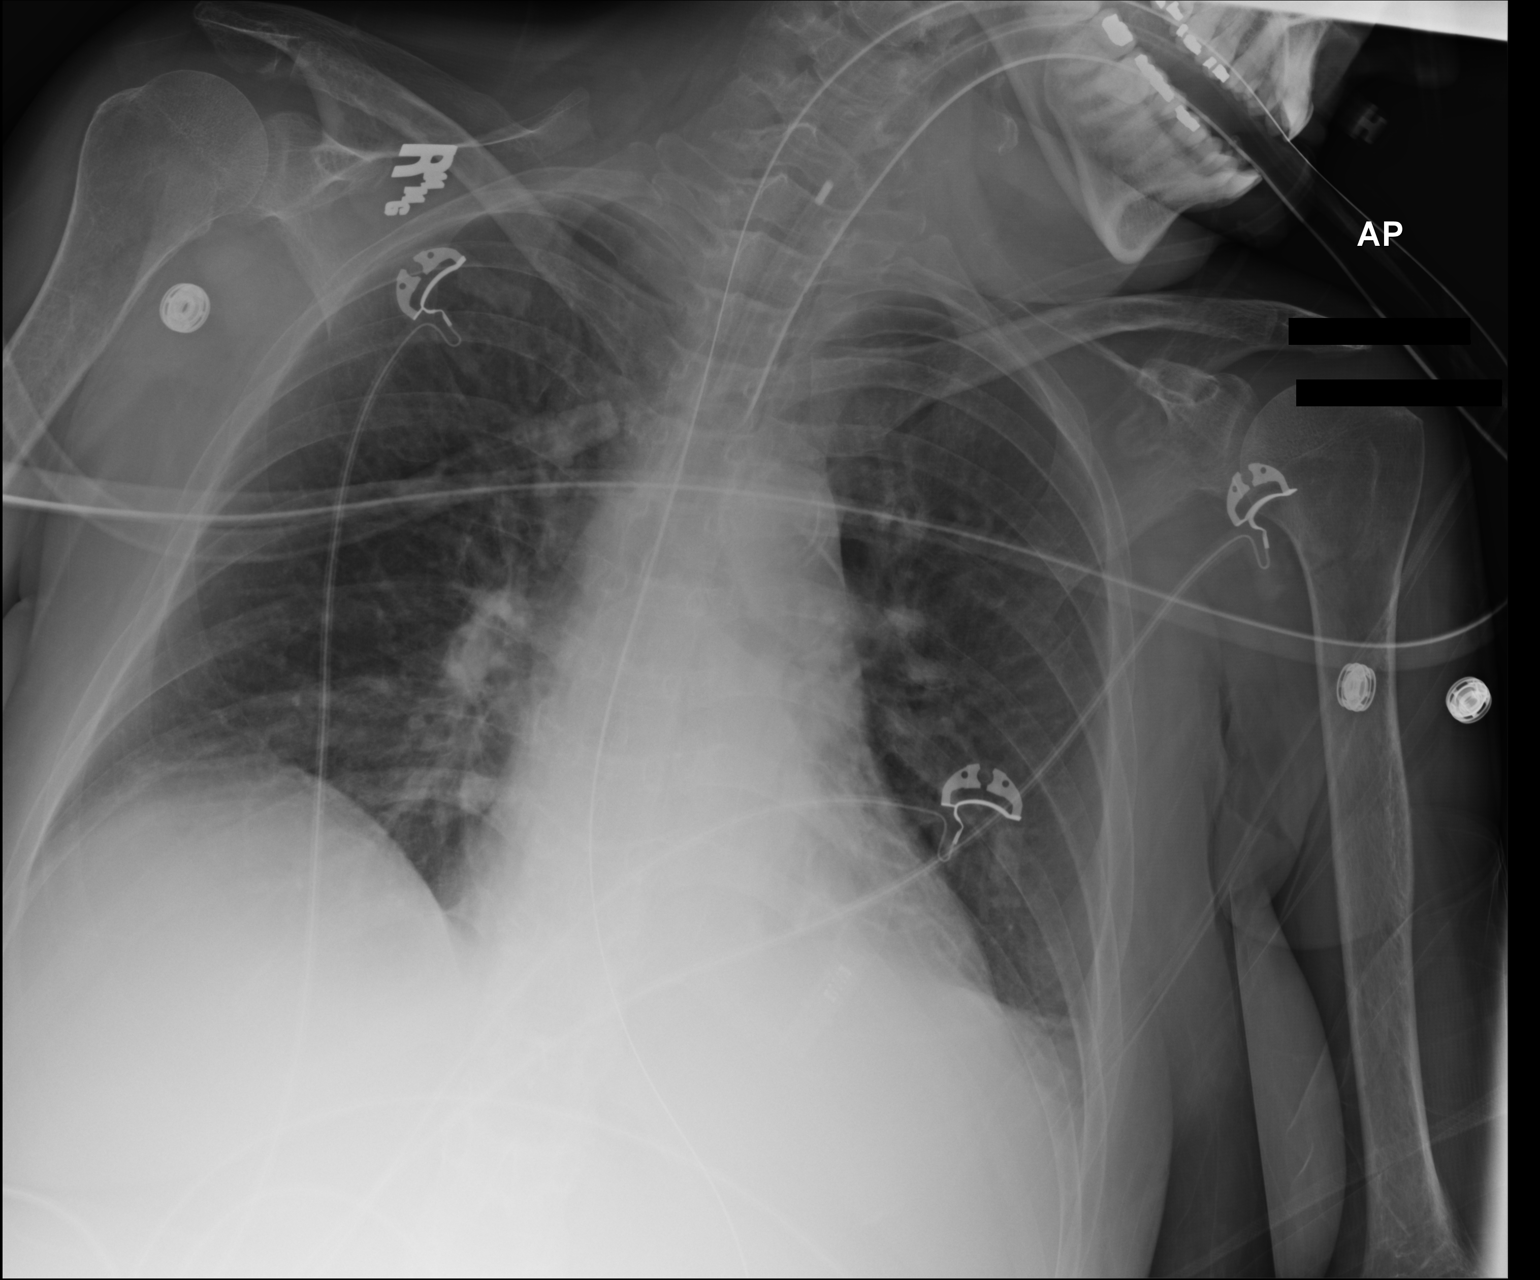

[1 of 1 positions shown; findings below may reference images not displayed]

FINDINGS: Endotracheal tube and NG tube in stable position. Heart size normal.
Basilar subsegmental atelectasis. No pleural effusion or
pneumothorax.
IMPRESSION: 1. Lines and tubes in stable position.

2. Basilar subsegmental atelectasis. Tiny left pleural effusion.
Chest is stable from prior exam.

## 2017-05-11 IMAGING — CR DG CHEST 1V PORT
1 series · 1 of 1 positions shown · non-contrast
Comparison: 10/08/2016

CLINICAL DATA: Acute respiratory failure

EXAM:
PORTABLE CHEST 1 VIEW

[AP]
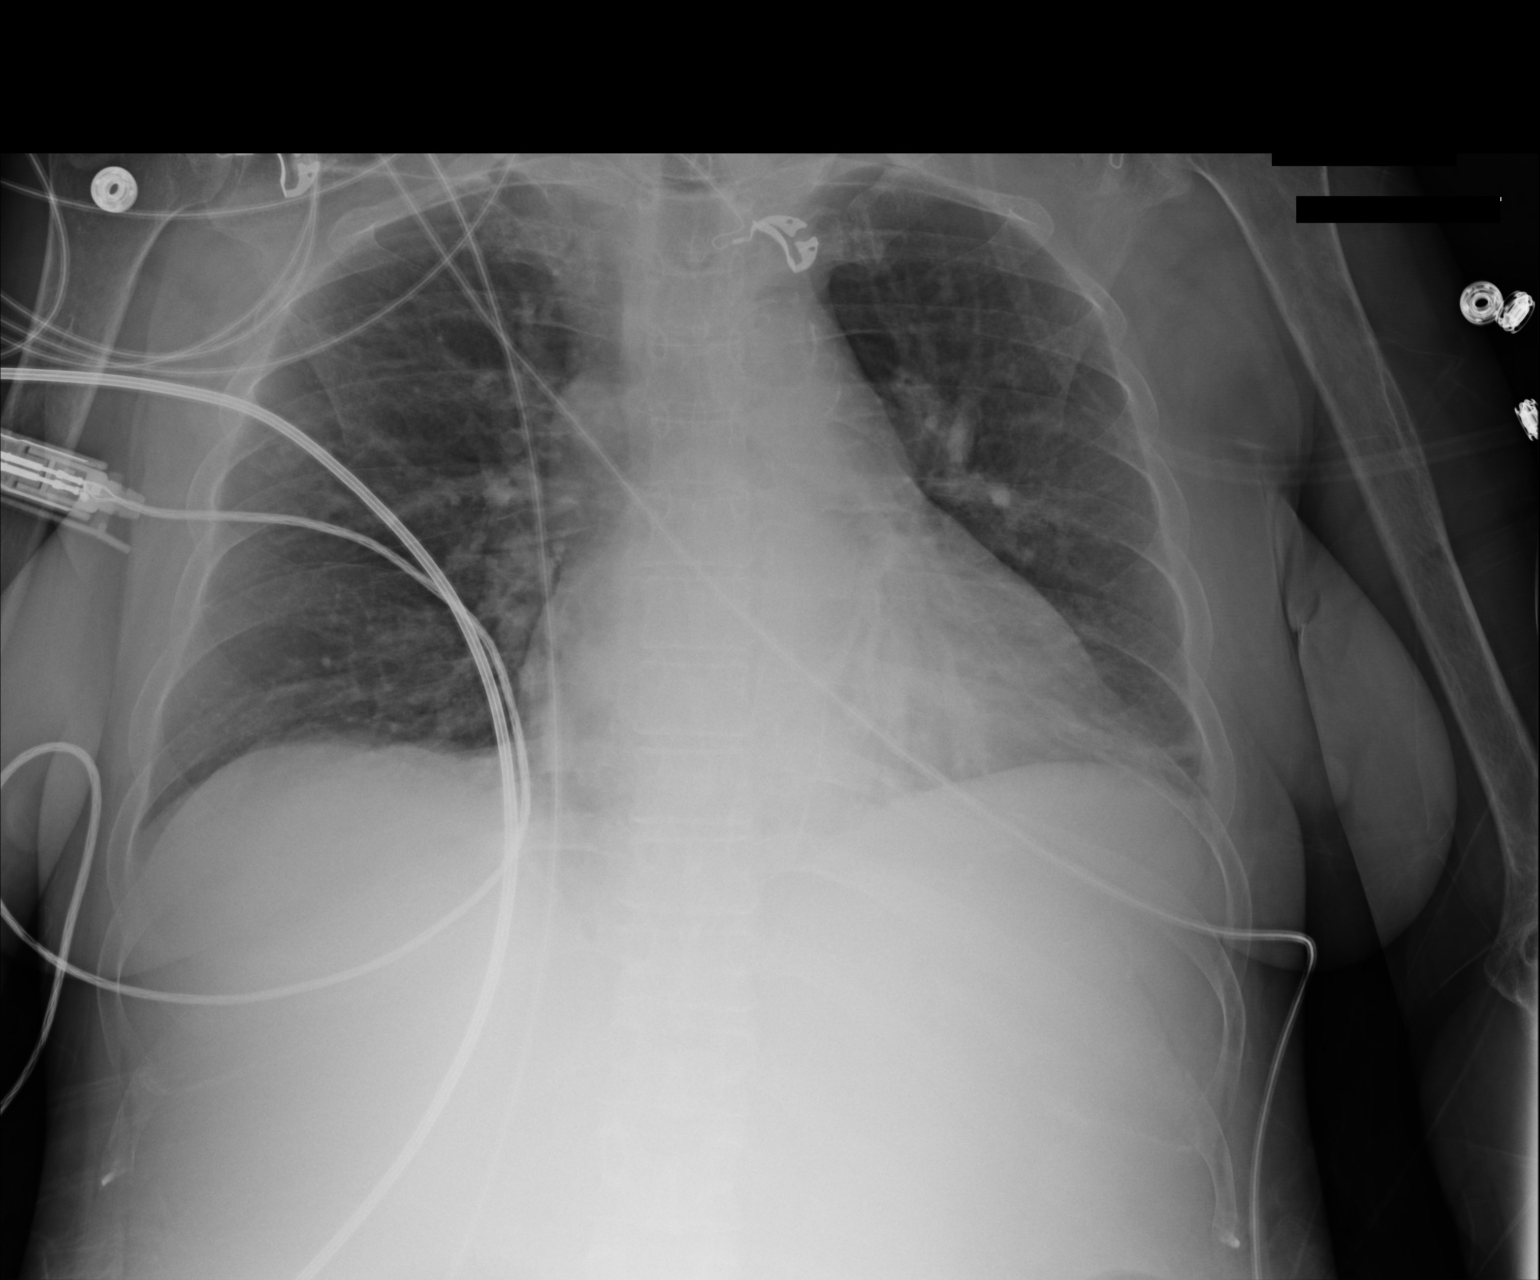

[1 of 1 positions shown; findings below may reference images not displayed]

FINDINGS: Endotracheal tube and NG tube removed.

Improved aeration of the lung bases. Mild residual left lower lobe
atelectasis or infiltrate remains. No effusion
IMPRESSION: Improved aeration in the lung bases following extubation. Mild
residual left lower lobe airspace disease.

## 2017-05-12 ENCOUNTER — Telehealth: Payer: Self-pay | Admitting: Gastroenterology

## 2017-05-13 NOTE — Telephone Encounter (Signed)
REVIEWED.  

## 2017-05-19 ENCOUNTER — Ambulatory Visit: Payer: Medicare Other | Admitting: Nurse Practitioner

## 2017-06-01 IMAGING — CT CT ABD-PELV W/ CM
2 of 4 series · 16 of 46 positions shown, 18 images · IV contrast (Isovue)
Comparison: CT abdomen pelvis 09/19/2016

CLINICAL DATA: Diffuse abdominal pain.

EXAM:
CT ABDOMEN AND PELVIS WITH CONTRAST
TECHNIQUE: Multidetector CT imaging of the abdomen and pelvis was performed
using the standard protocol following bolus administration of
intravenous contrast.
CONTRAST:  75mL LLHAAX-JGG IOPAMIDOL (LLHAAX-JGG) INJECTION 61%,
75mL LLHAAX-JGG IOPAMIDOL (LLHAAX-JGG) INJECTION 61%

[Series 2: axial st · axial · 0.85mm/px · z∈[-516,-111]mm · 13 of 91 slices shown, 15 images]
[im 5/91  soft-tissue]
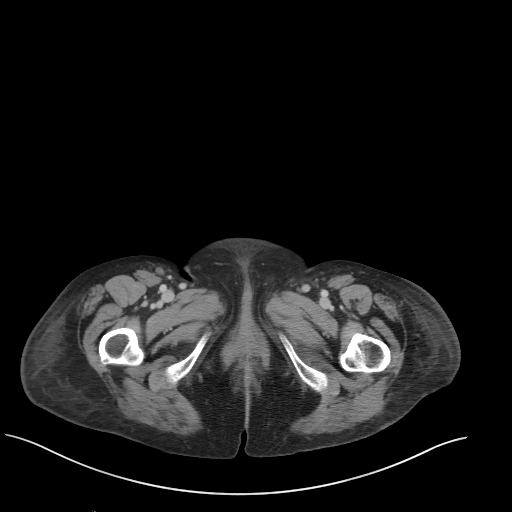
[im 5/91  bone]
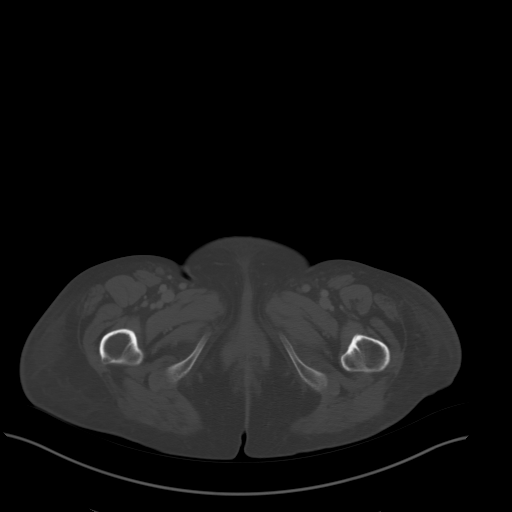
[im 13/91  soft-tissue]
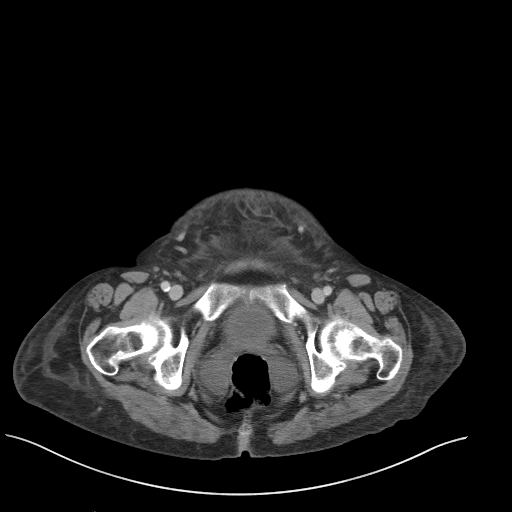
[im 18/91  soft-tissue]
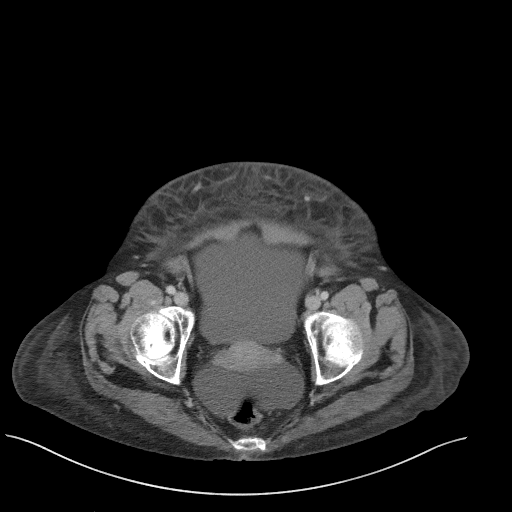
[im 26/91  soft-tissue]
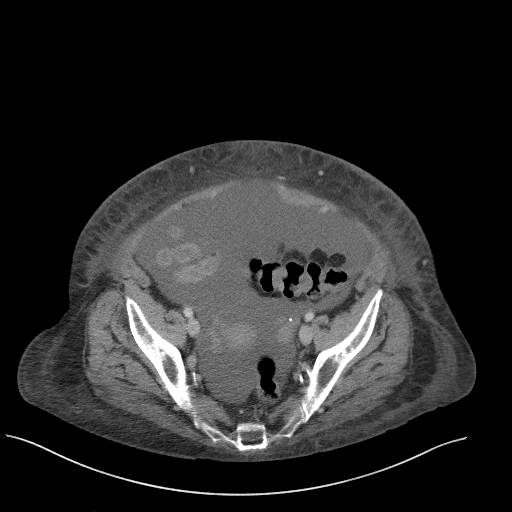
[im 31/91  soft-tissue]
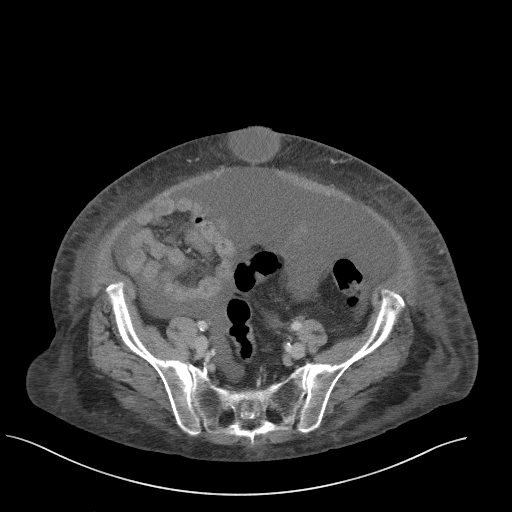
[im 39/91  soft-tissue]
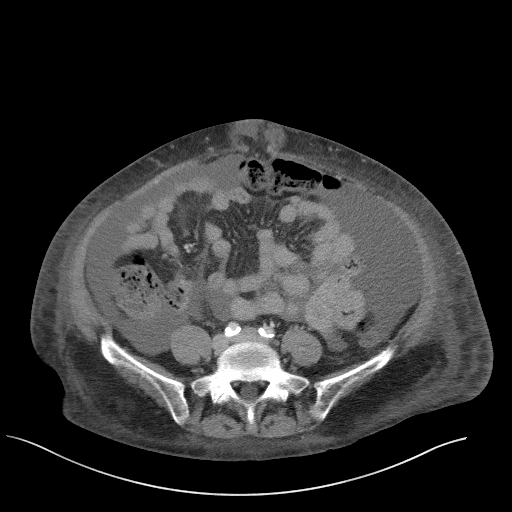
[im 48/91  soft-tissue]
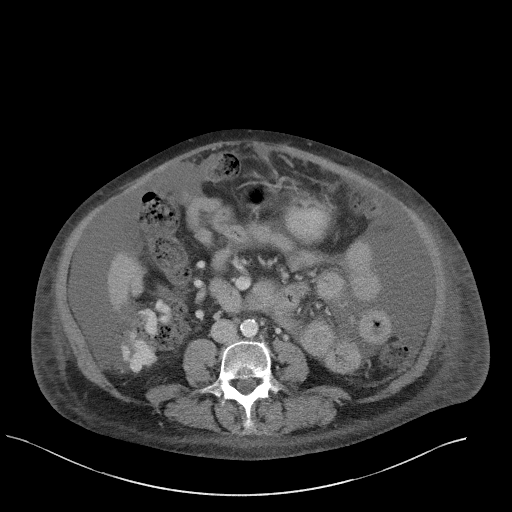
[im 52/91  soft-tissue]
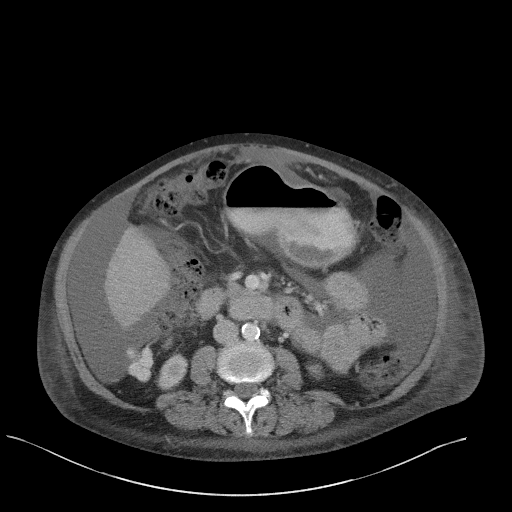
[im 61/91  soft-tissue]
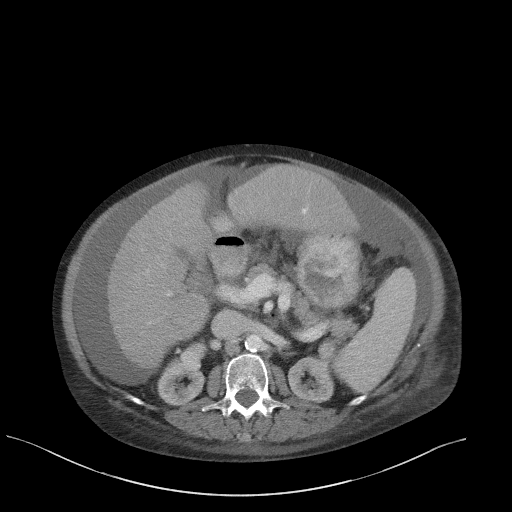
[im 61/91  bone]
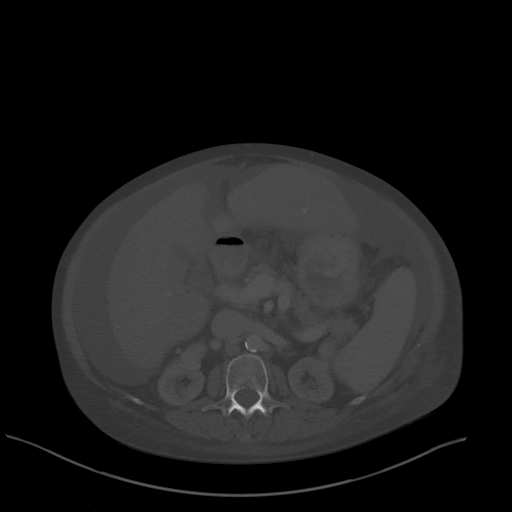
[im 65/91  soft-tissue]
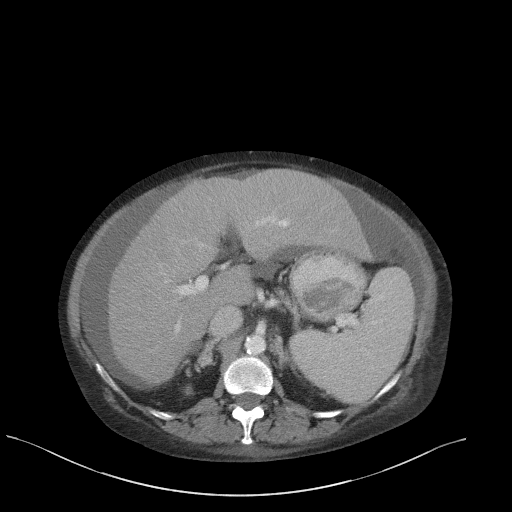
[im 73/91  soft-tissue]
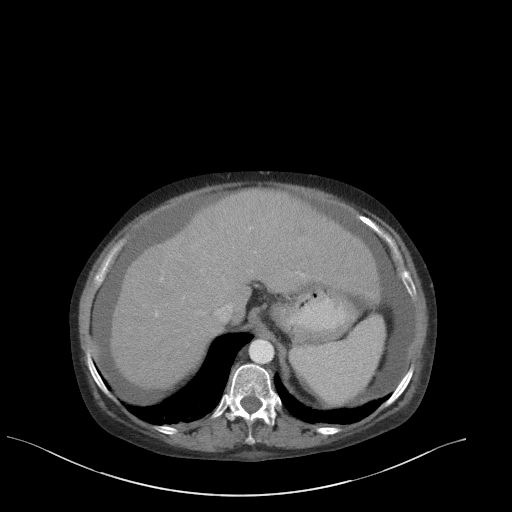
[im 78/91  soft-tissue]
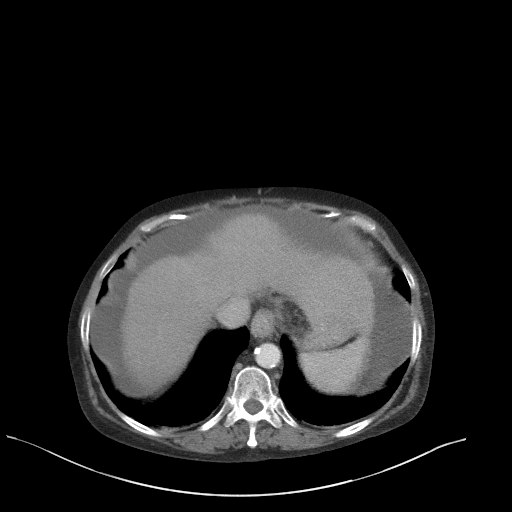
[im 86/91  soft-tissue]
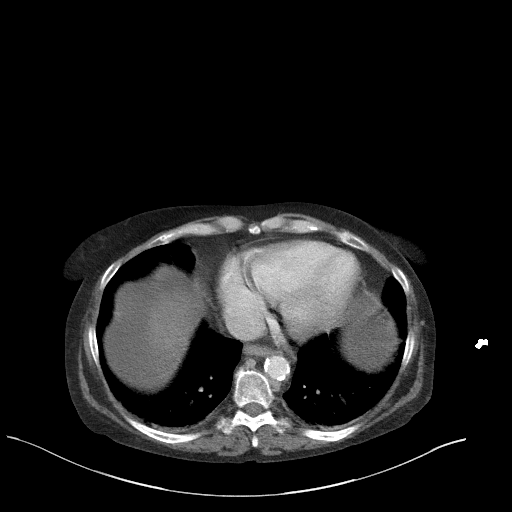

[Series 5: coronal st · coronal · 0.89mm/px · 3 of 117 slices shown]
[im 39/117  soft-tissue]
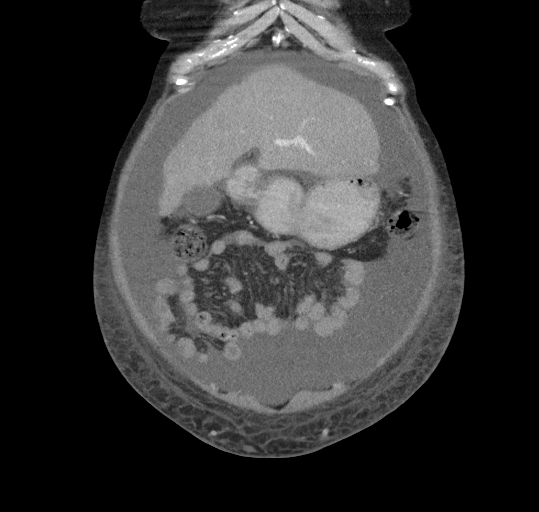
[im 52/117  soft-tissue]
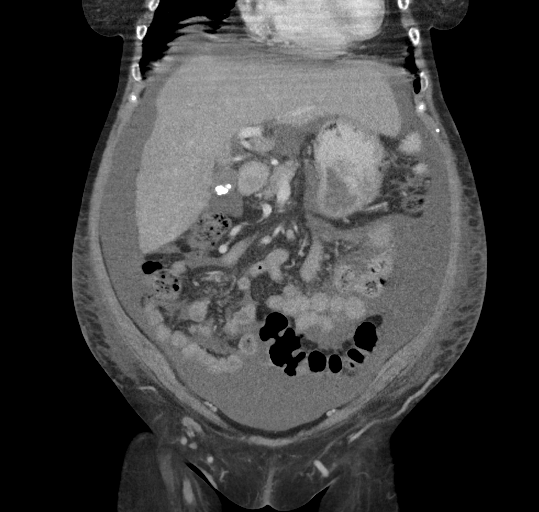
[im 65/117  soft-tissue]
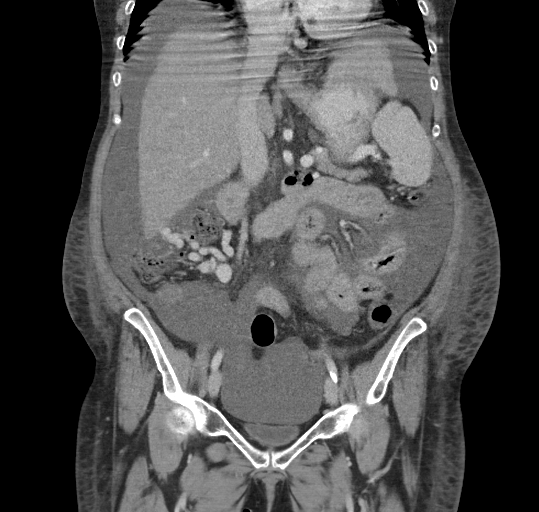

[16 of 46 positions shown; findings below may reference images not displayed]

FINDINGS: Lower chest: No pulmonary nodules. No visible pleural or pericardial
effusion.

Hepatobiliary: There is hypertrophy of the left hepatic lobe and
caudate with a diffusely nodular hepatic contour. There is a large
amount of perihepatic ascites that tracks into the right paracolic
gutter and pelvis. There is cholelithiasis. No focal liver lesions.
The portal vein is patent.

Pancreas: Normal pancreatic contours and enhancement. No
peripancreatic fluid collection or pancreatic ductal dilatation.

Spleen: The spleen is mildly enlarged. There is a large amount of
perisplenic ascites tracking down the left pericolic gutter into the
pelvis.

Adrenals/Urinary Tract: Normal adrenal glands. Both kidneys are
atrophic. There is a dilated vein connecting the right renal vein
and superior mesenteric vein.

Stomach/Bowel: No abnormal bowel dilatation. No bowel wall
thickening or adjacent fat stranding to indicate acute inflammation.
No abdominal fluid collection.

Vascular/Lymphatic: There is atherosclerotic calcification of the
non aneurysmal abdominal aorta. The portal vein, superior mesenteric
vein and splenic vein are patent. No abdominal or pelvic adenopathy.

Reproductive: Normal uterus and ovaries.

Musculoskeletal: No lytic or blastic osseous lesion. Normal
visualized extrathoracic and extraperitoneal soft tissues.

Other: There is an umbilical hernia that is fluid filled.
IMPRESSION: 1. Hepatic cirrhosis and findings of portal hypertension, including
large volume ascites throughout the abdomen, extending along both
paracolic gutters into the pelvis.
2. Cholelithiasis.
3. Aortic atherosclerosis.

## 2017-06-10 NOTE — Telephone Encounter (Signed)
Wanda [name], can you mail a sympathy card

## 2017-06-10 NOTE — Telephone Encounter (Signed)
So sorry to hear this.  

## 2017-06-10 NOTE — Telephone Encounter (Signed)
[name] called this morning to let us know that Wanda [name] past away this morning around 5am. He thanked all of [place] for the care we have given her.

## 2017-06-10 NOTE — Telephone Encounter (Signed)
Thank you for the update. I am sorry to hear this.

## 2017-06-10 DEATH — deceased

## 2017-06-22 IMAGING — DX DG ABDOMEN ACUTE W/ 1V CHEST
4 series · 4 of 4 positions shown · non-contrast
Comparison: Chest radiograph October 09, 2016

CLINICAL DATA: Abdominal pain. History of end-stage renal disease
on dialysis, pneumonia, cirrhosis.

EXAM:
DG ABDOMEN ACUTE W/ 1V CHEST

[chest pa]
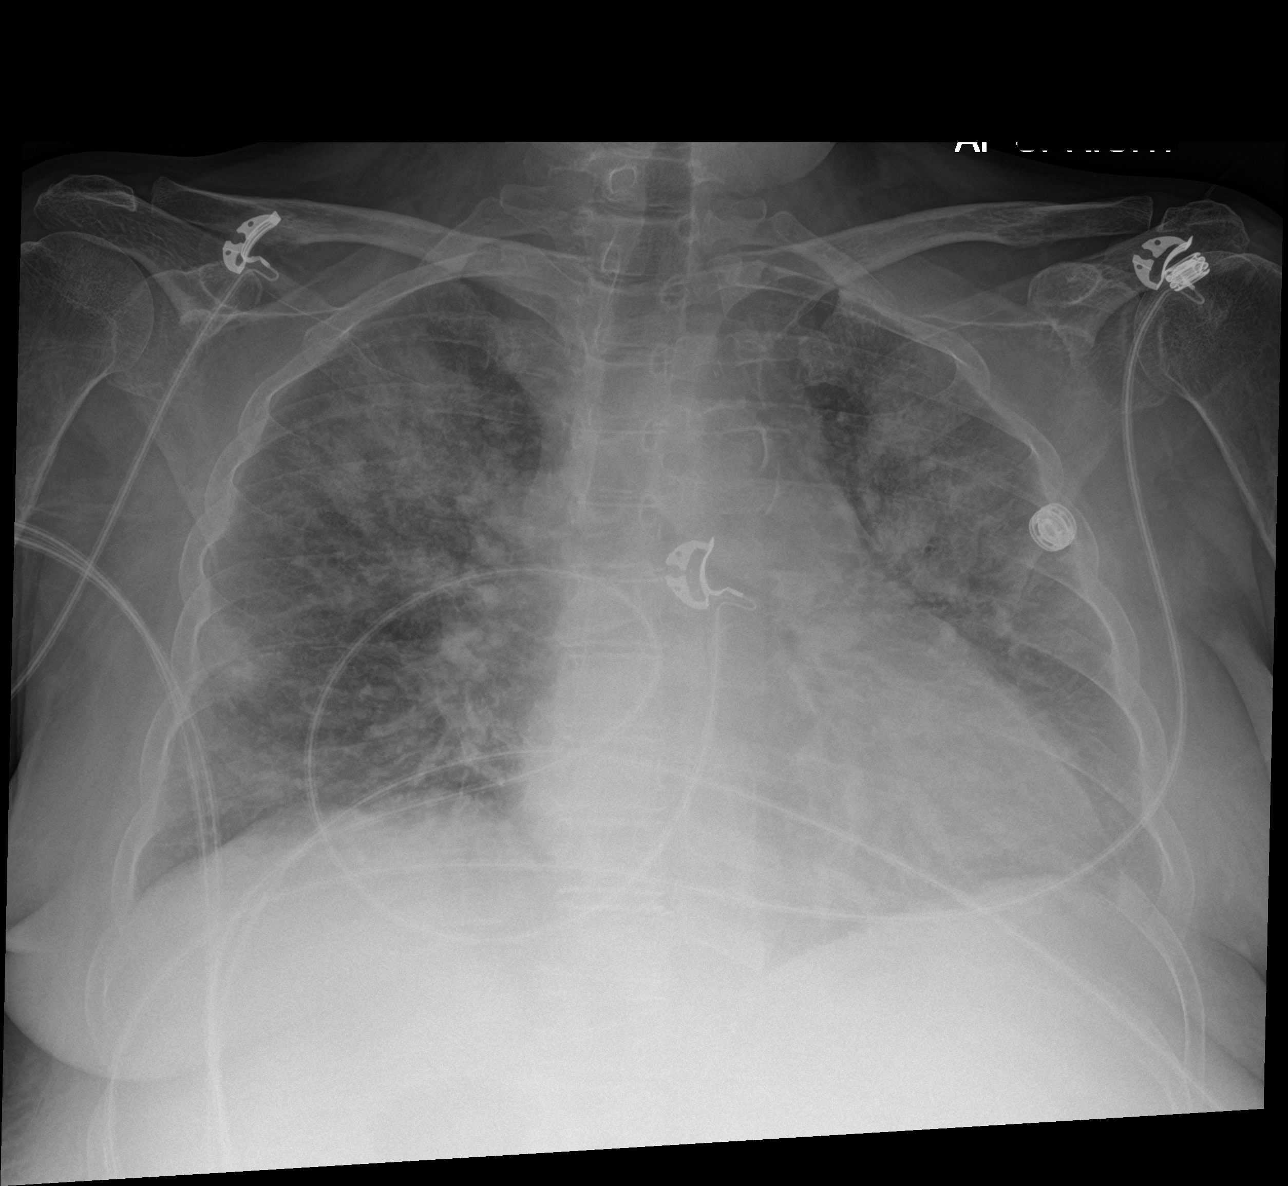

[abdomen erect]
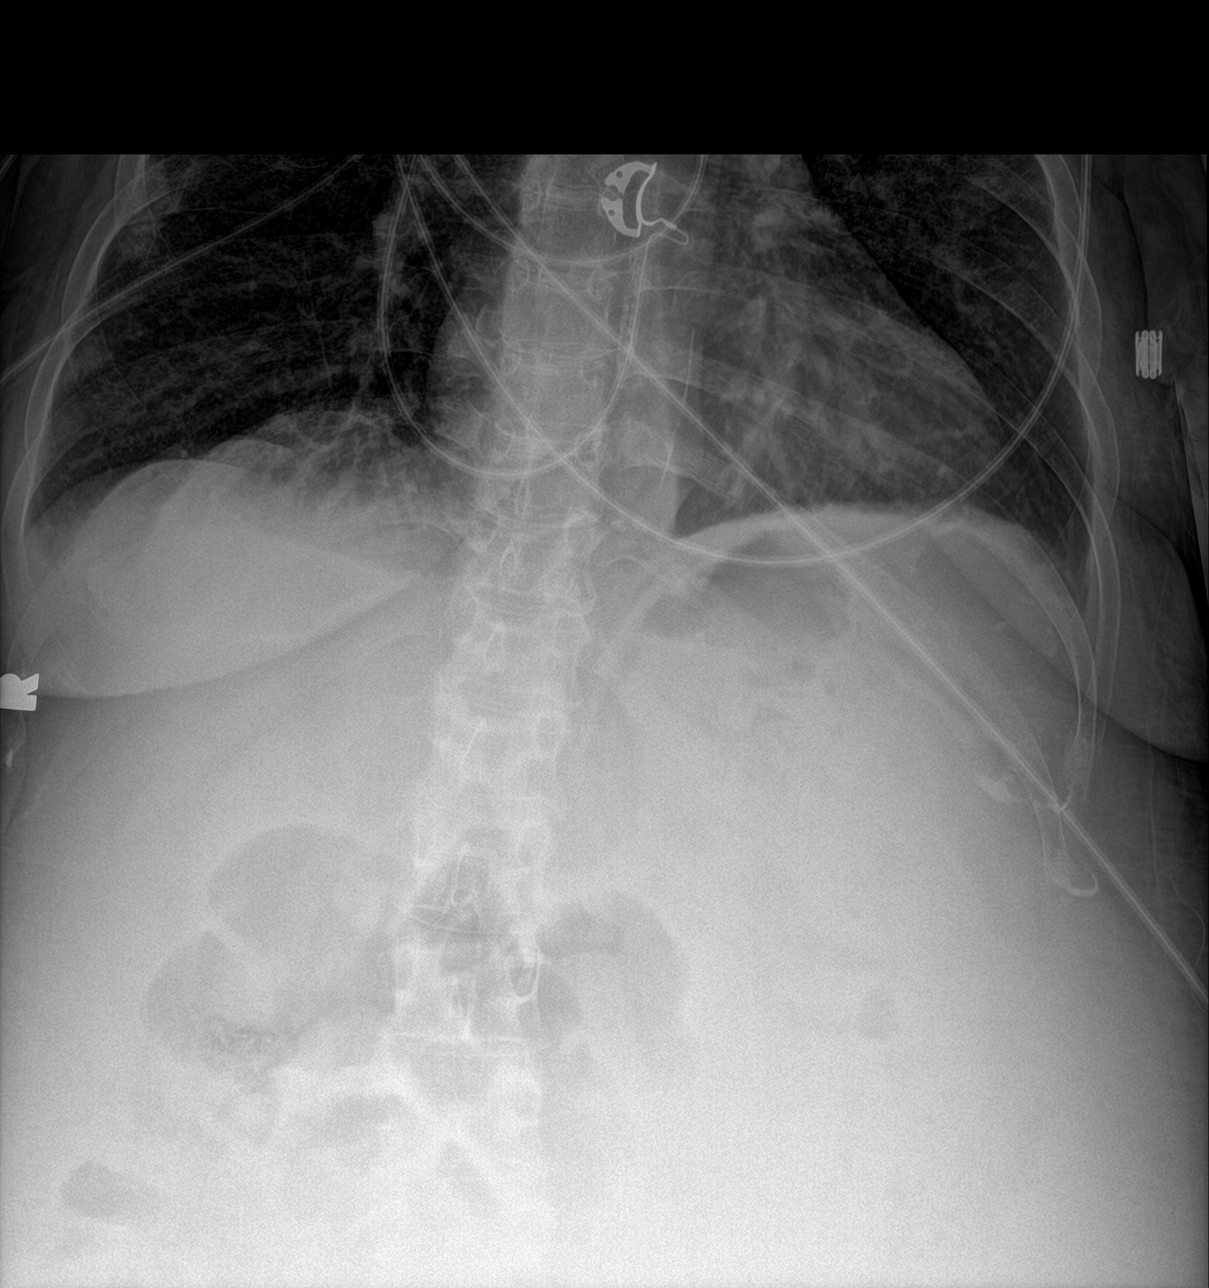

[abdomen supine (1 of 2)]
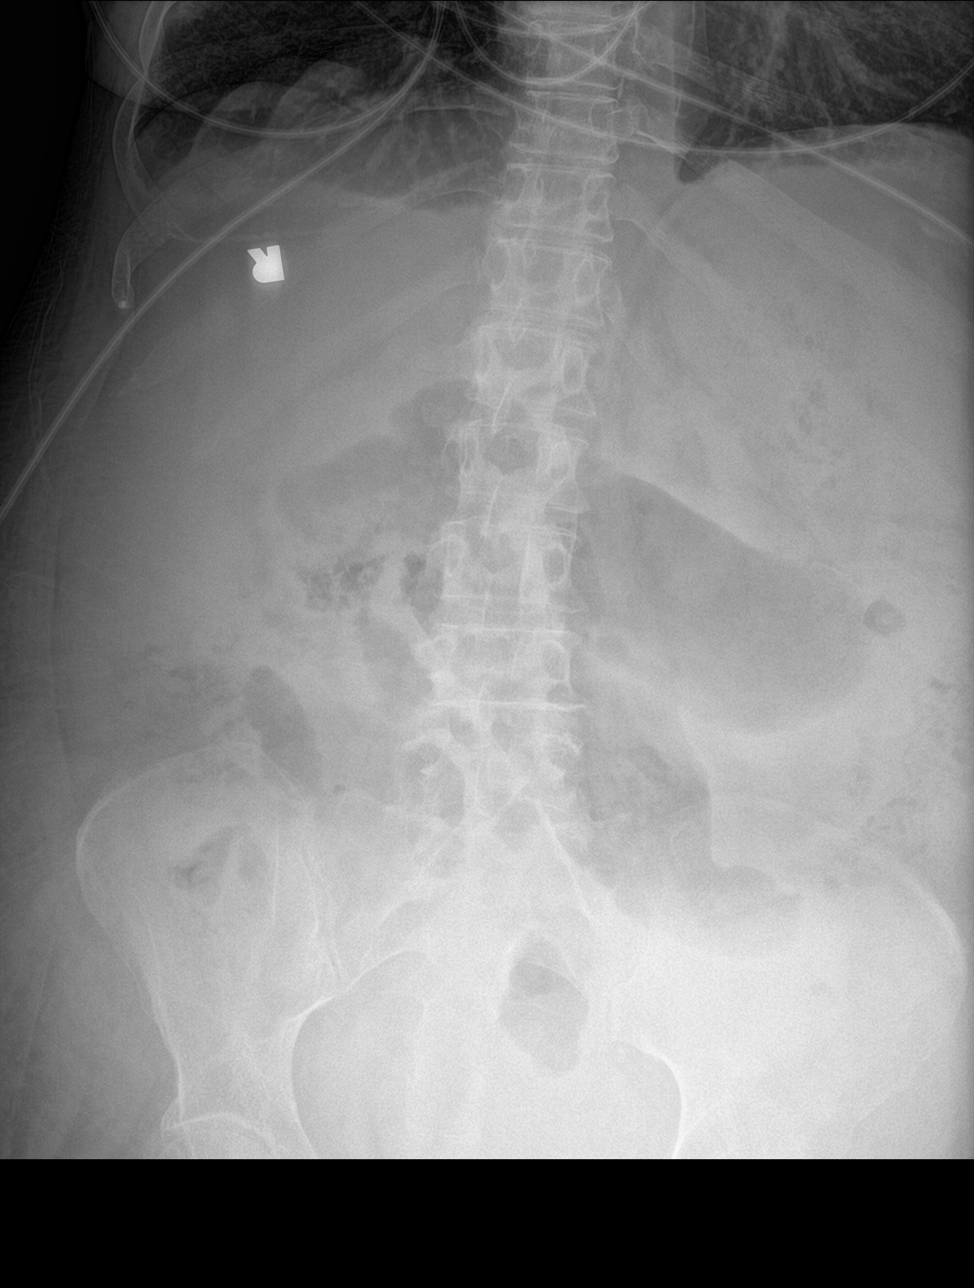

[abdomen supine (2 of 2)]
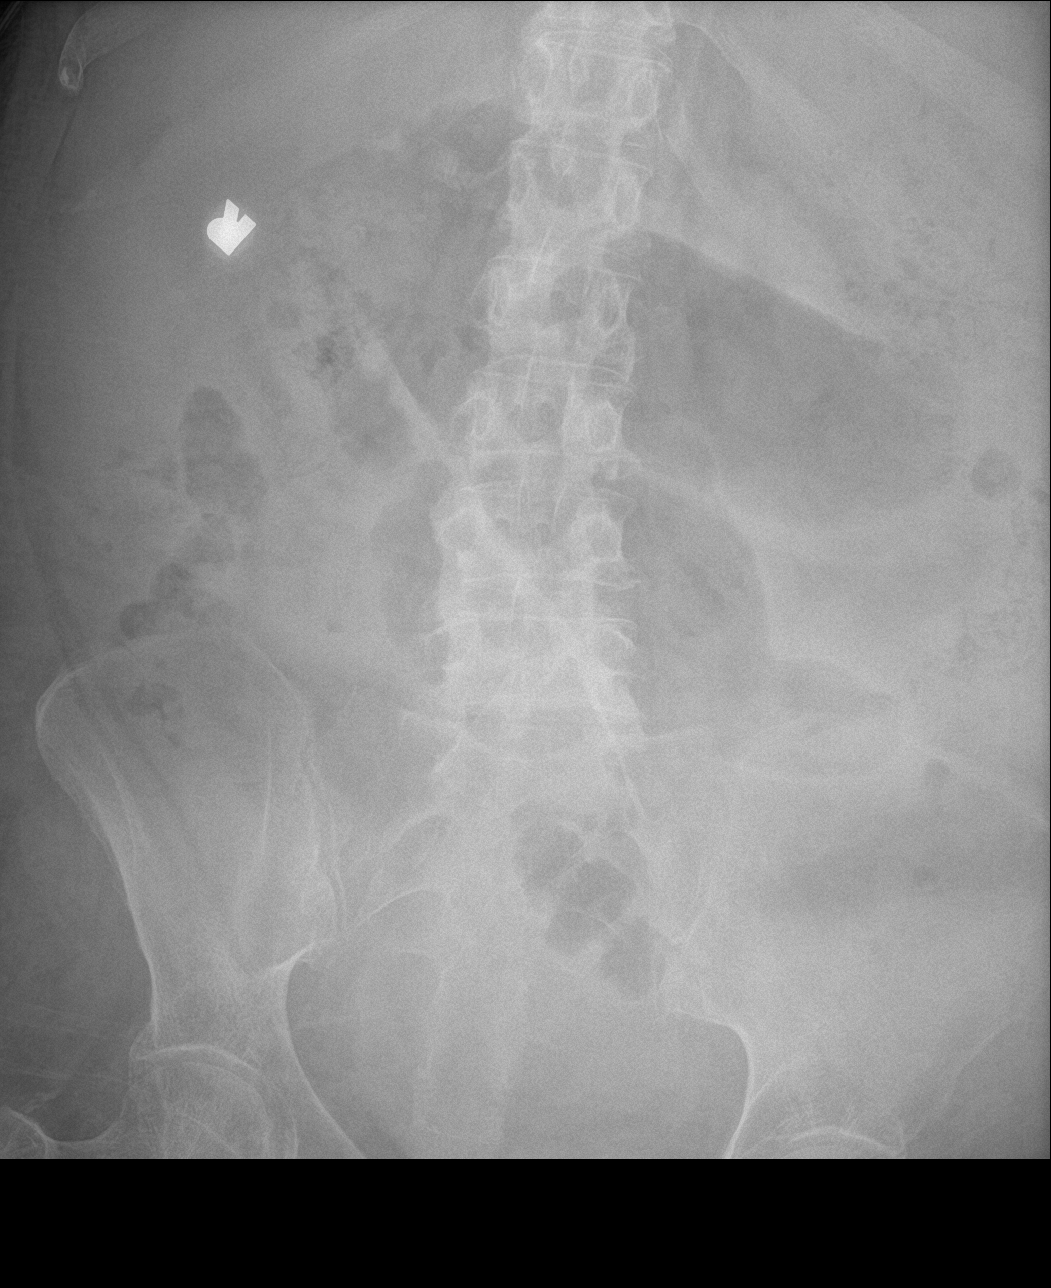

[4 of 4 positions shown; findings below may reference images not displayed]

FINDINGS: Increased diffuse interstitial and alveolar airspace opacities.
Cardiac silhouette is mildly enlarged, unchanged. Calcified aortic
knob. No pleural effusion. No pneumothorax.

Hazy appearance of the abdomen suggests ascites. No intra-abdominal
mass effect or pathologic calcification on this habitus limited
examination. Bowel gas pattern is nondilated and nonobstructive.
Soft tissue planes and included osseous structures are
nonsuspicious.
IMPRESSION: Interstitial and alveolar airspace opacities concerning for
multifocal pneumonia, possible underlying pulmonary edema. Mild
cardiomegaly.

Probable ascites.  Nonspecific bowel gas pattern.

## 2017-06-24 IMAGING — CR DG CHEST 1V PORT
1 series · 1 of 1 positions shown · non-contrast
Comparison: Acute abdominal series 11/20/2016

CLINICAL DATA: Pulmonary edema.

EXAM:
PORTABLE CHEST 1 VIEW

[pa]
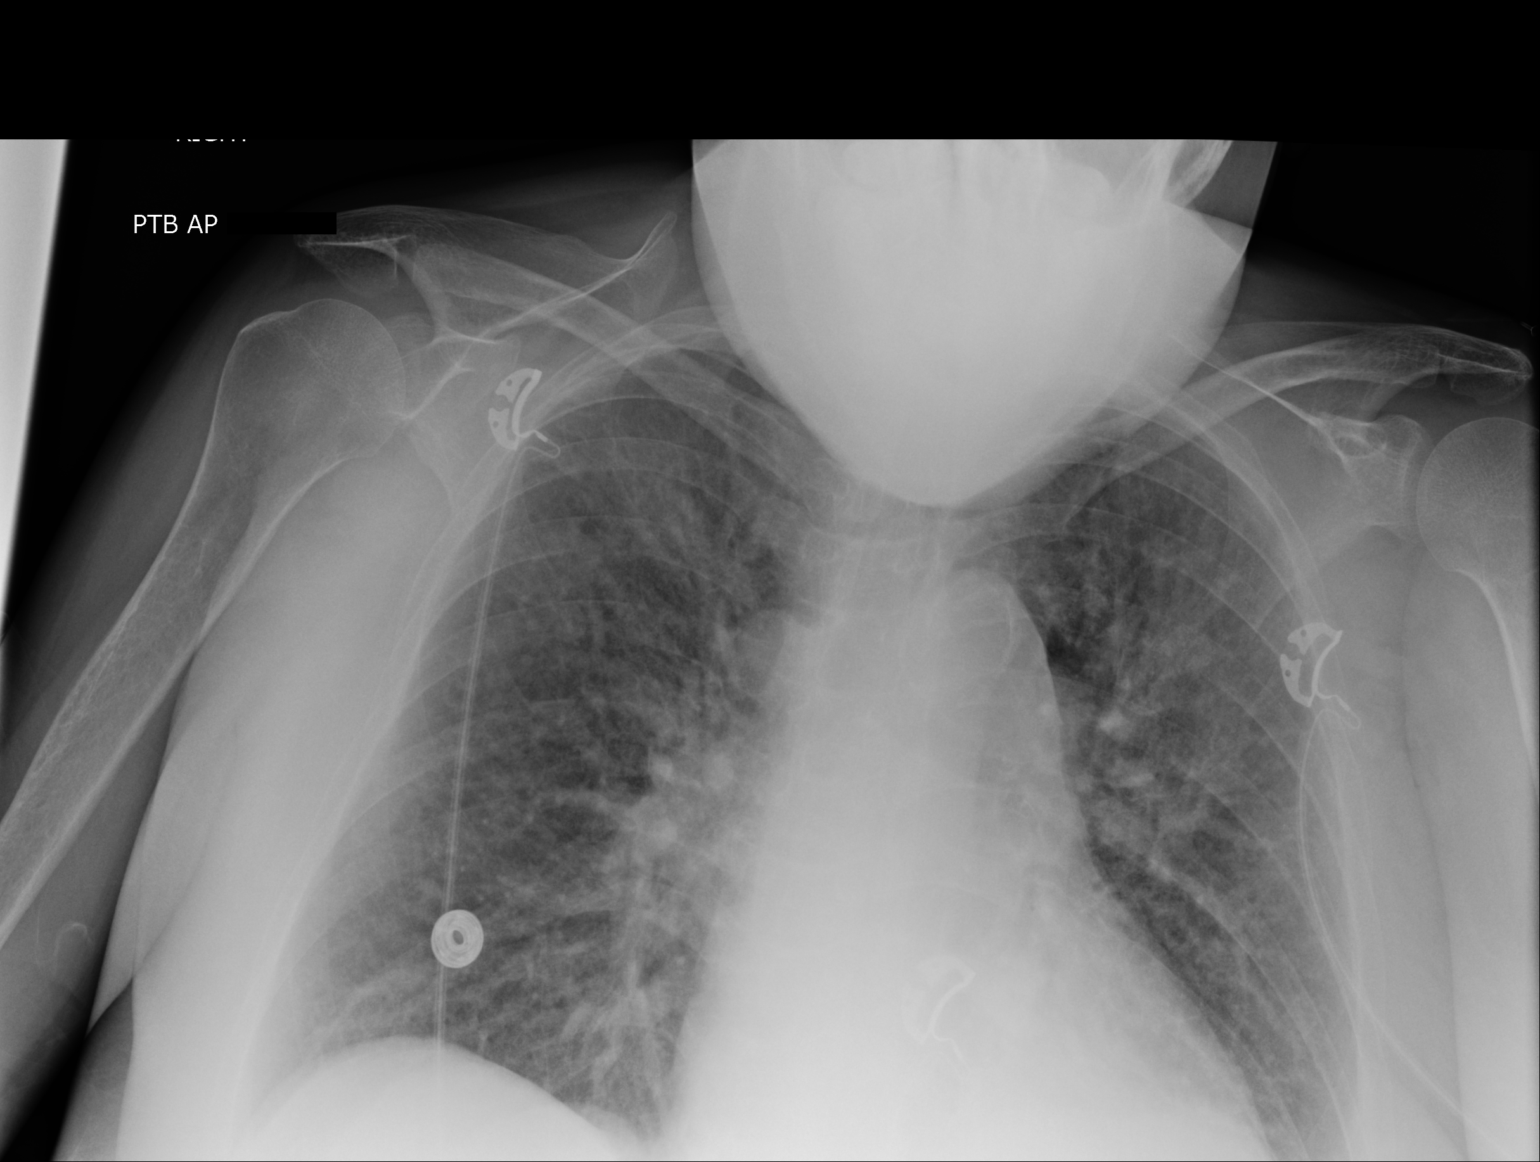

[1 of 1 positions shown; findings below may reference images not displayed]

FINDINGS: Heart is mildly enlarged. Interstitial edema is improved.
Previously-seen upper lobe airspace consolidation is improved. The
left base is not imaged. Atherosclerotic calcifications are present
at the aortic arch.
IMPRESSION: 1. Cardiomegaly with improving interstitial edema.

## 2017-06-25 IMAGING — CT CT ABD-PELV W/O CM
2 of 4 series · 15 of 46 positions shown, 17 images · non-contrast
Comparison: CT of the abdomen and pelvis performed 10/30/2016

CLINICAL DATA: Acute onset of worsening abdominal distention.
Initial encounter.

EXAM:
CT ABDOMEN AND PELVIS WITHOUT CONTRAST
TECHNIQUE: Multidetector CT imaging of the abdomen and pelvis was performed
following the standard protocol without IV contrast.

[Series 2: axial st · axial · 0.80mm/px · z∈[+820,+1254]mm · 12 of 95 slices shown, 14 images]
[im 4/95  soft-tissue]
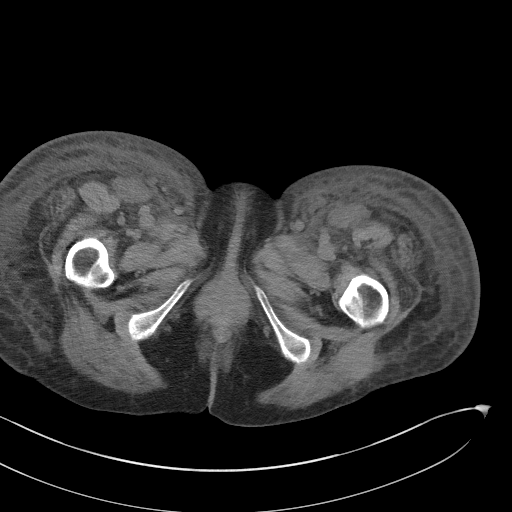
[im 4/95  bone]
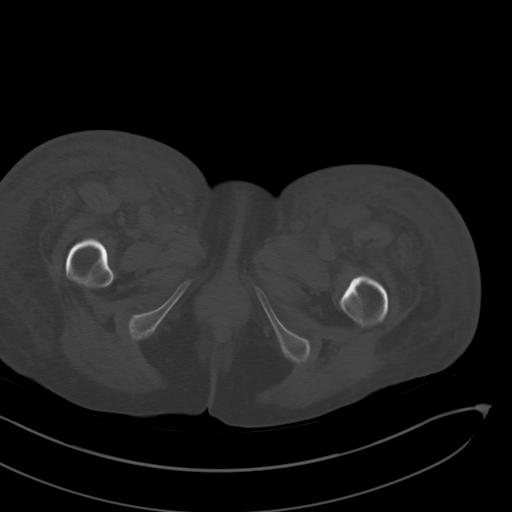
[im 12/95  soft-tissue]
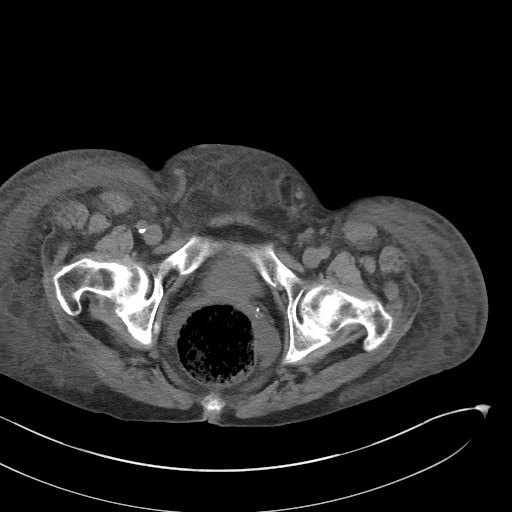
[im 20/95  soft-tissue]
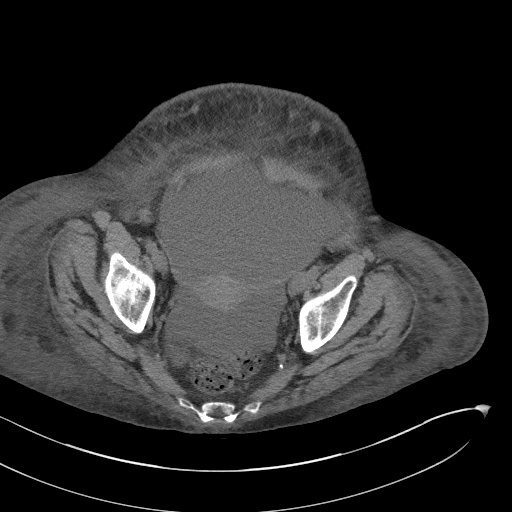
[im 28/95  soft-tissue]
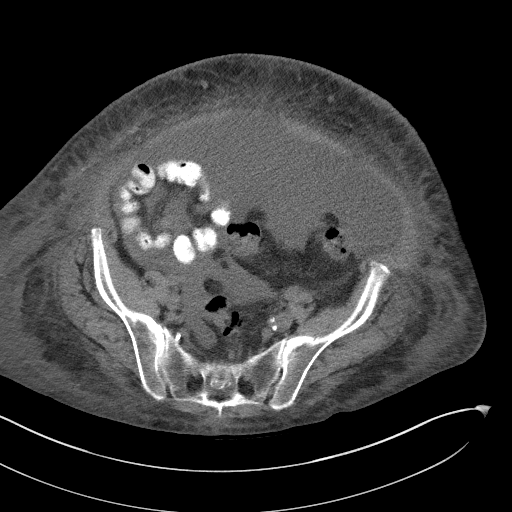
[im 36/95  soft-tissue]
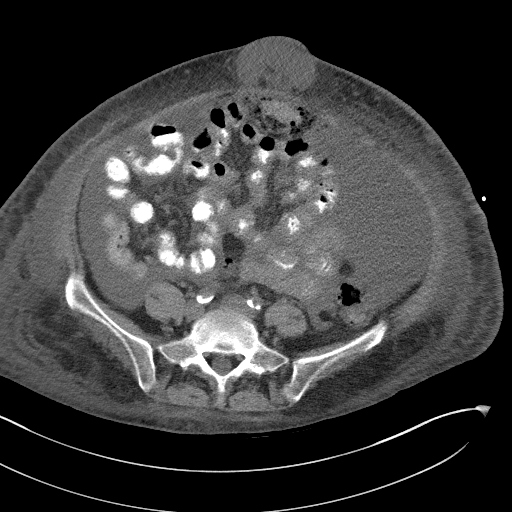
[im 44/95  soft-tissue]
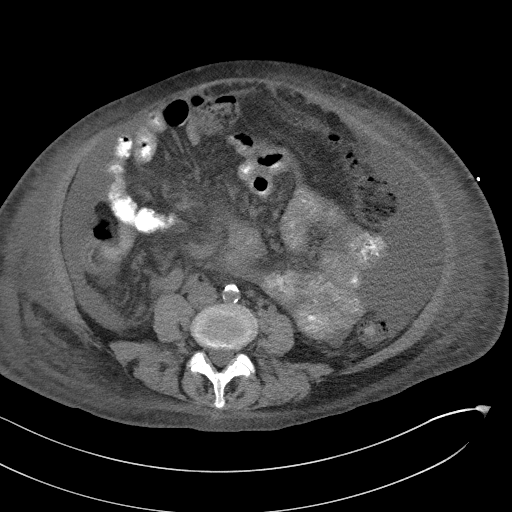
[im 51/95  soft-tissue]
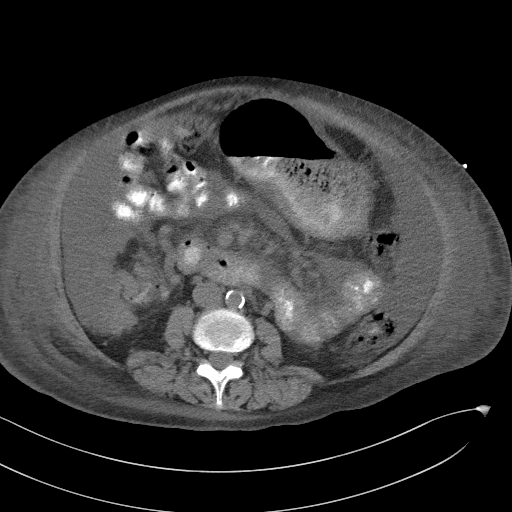
[im 59/95  soft-tissue]
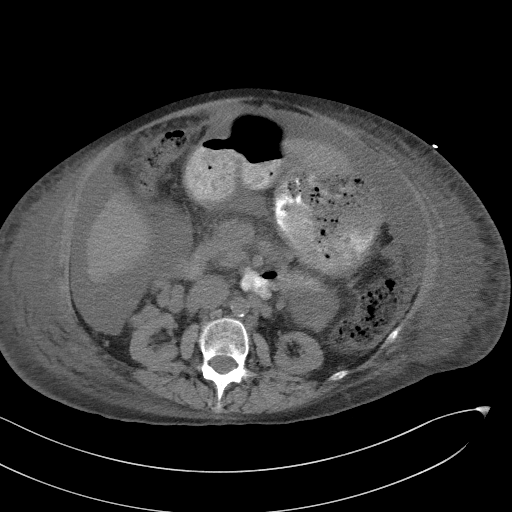
[im 67/95  soft-tissue]
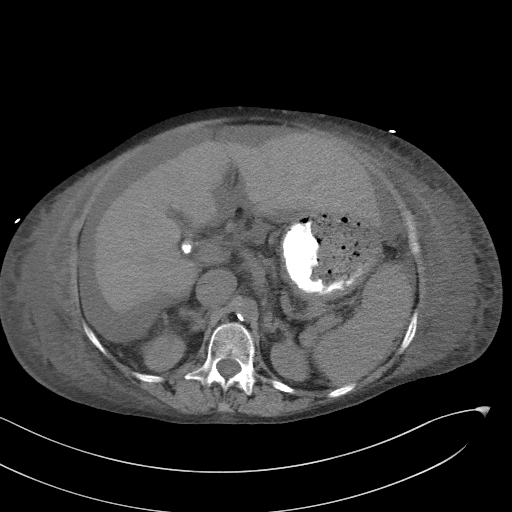
[im 67/95  bone]
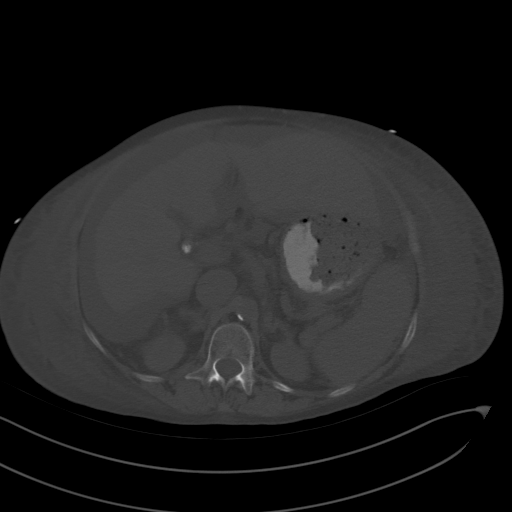
[im 75/95  soft-tissue]
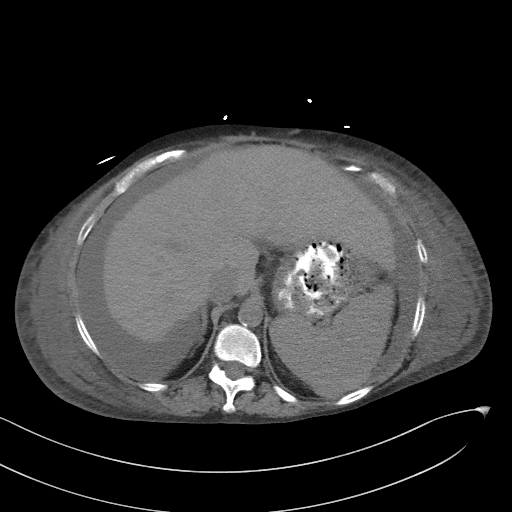
[im 83/95  soft-tissue]
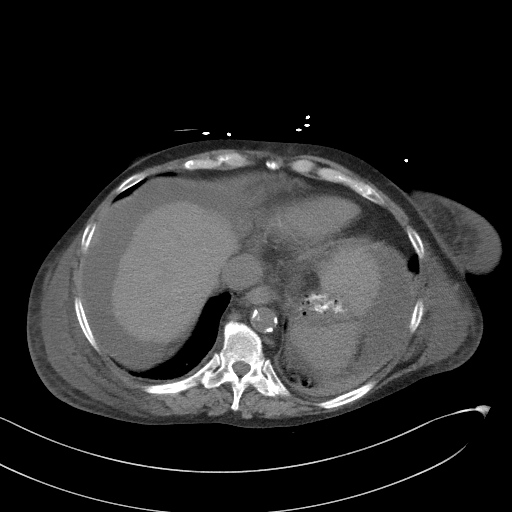
[im 91/95  soft-tissue]
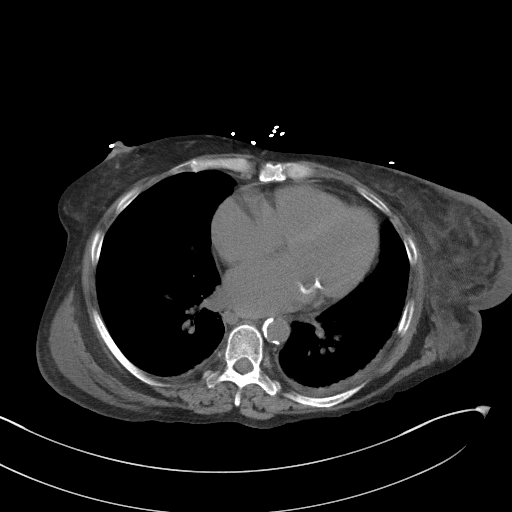

[Series 5: coronal st · coronal · 0.86mm/px · 3 of 107 slices shown]
[im 36/107  soft-tissue]
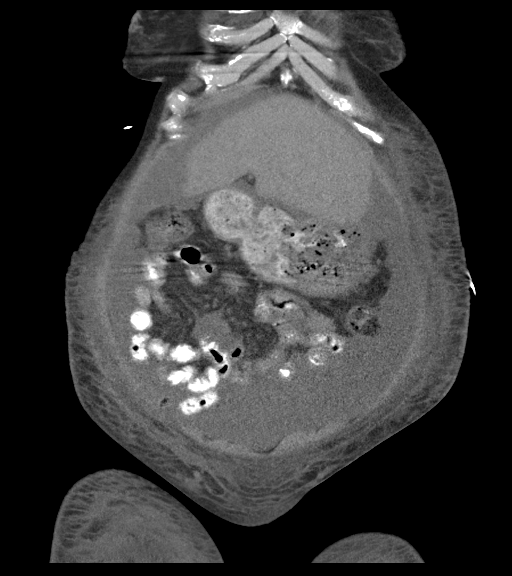
[im 48/107  soft-tissue]
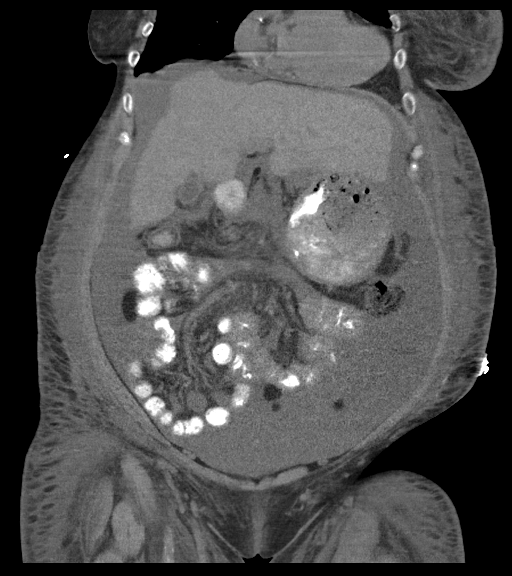
[im 59/107  soft-tissue]
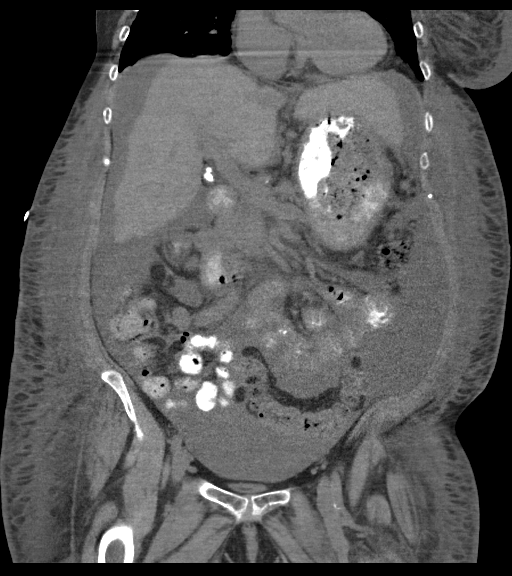

[15 of 46 positions shown; findings below may reference images not displayed]

FINDINGS: Lower chest: Trace bilateral pleural fluid is noted. Diffuse
coronary artery calcifications are seen.

Hepatobiliary: The nodular contour of the liver is compatible with
hepatic cirrhosis. No definite mass is characterized. Stones are
seen within the gallbladder. The gallbladder is otherwise grossly
unremarkable. The common bile duct remains normal in caliber.

Prominent splenic, gastric and right renal varices are noted.

Pancreas: The pancreas is within normal limits.

Spleen: The spleen is unremarkable in appearance.

Adrenals/Urinary Tract: The adrenal glands are unremarkable in
appearance. Mild bilateral renal atrophy is noted. There is no
evidence of hydronephrosis. No renal or ureteral stones are
identified. No perinephric stranding is seen.

Stomach/Bowel: The appendix is normal in caliber, without evidence
of appendicitis. Vague wall thickening is noted along the ascending
colon, concerning for infectious or inflammatory colitis. The
remainder of the colon is grossly unremarkable.

Contrast is noted within the small bowel. The small bowel is grossly
unremarkable in appearance. The stomach is grossly unremarkable in
appearance.

Vascular/Lymphatic: Scattered calcification is seen along the
abdominal aorta and its branches. The abdominal aorta is otherwise
grossly unremarkable. The inferior vena cava is grossly
unremarkable. No retroperitoneal lymphadenopathy is seen. No pelvic
sidewall lymphadenopathy is identified.

Reproductive: The bladder is decompressed and not well assessed. The
uterus is grossly unremarkable. The ovaries are relatively
symmetric. No suspicious adnexal masses are seen.

Other: Relatively large volume ascites is seen within the abdomen
and pelvis, extending into a moderate umbilical hernia.

Diffuse soft tissue edema is seen tracking along the abdominal and
pelvic wall. Asymmetric edema and subcutaneous inflammation are
noted at the left breast.

Musculoskeletal: No acute osseous abnormalities are identified. The
visualized musculature is unremarkable in appearance.
IMPRESSION: 1. Vague wall thickening along the ascending colon, concerning for
infectious or inflammatory colitis.
2. Relatively large volume ascites within the abdomen and pelvis,
extending into a moderate umbilical hernia.
3. Diffuse soft tissue edema tracking along the abdominal and pelvic
wall.
4. Asymmetric edema and subcutaneous inflammation noted at the left
breast. This could reflect underlying systemic edema, though would
correlate clinically to exclude inflammatory cancer of the left
breast.
5. Findings of hepatic cirrhosis, with prominent splenic, gastric
and right renal varices.
6. Trace bilateral pleural fluid.
7. Diffuse coronary artery calcifications seen.
8. Cholelithiasis.  Gallbladder otherwise unremarkable.
9. Mild bilateral renal atrophy noted.
10. Scattered aortic atherosclerosis.

## 2017-06-25 IMAGING — US US ABDOMEN LIMITED
1 series · 3 of 3 positions shown · non-contrast
Comparison: Abdominal series [DATE].  Ultrasound 10/30/2016.

CLINICAL DATA: Ascites.  Evaluate for paracentesis.

EXAM:
LIMITED ABDOMEN ULTRASOUND FOR ASCITES
TECHNIQUE: Limited ultrasound survey for ascites was performed in all four
abdominal quadrants.

[Series 1: us abdomen limited · 0.27mm/px · 3 of 3 slices shown]
[im 1/3]
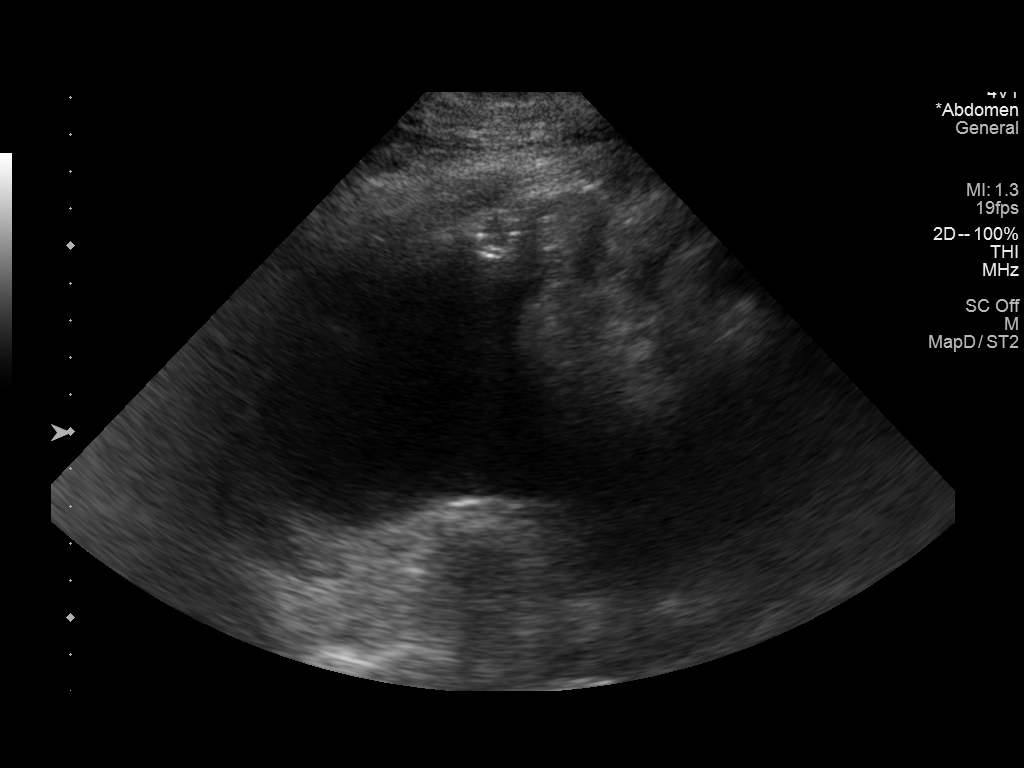
[im 2/3]
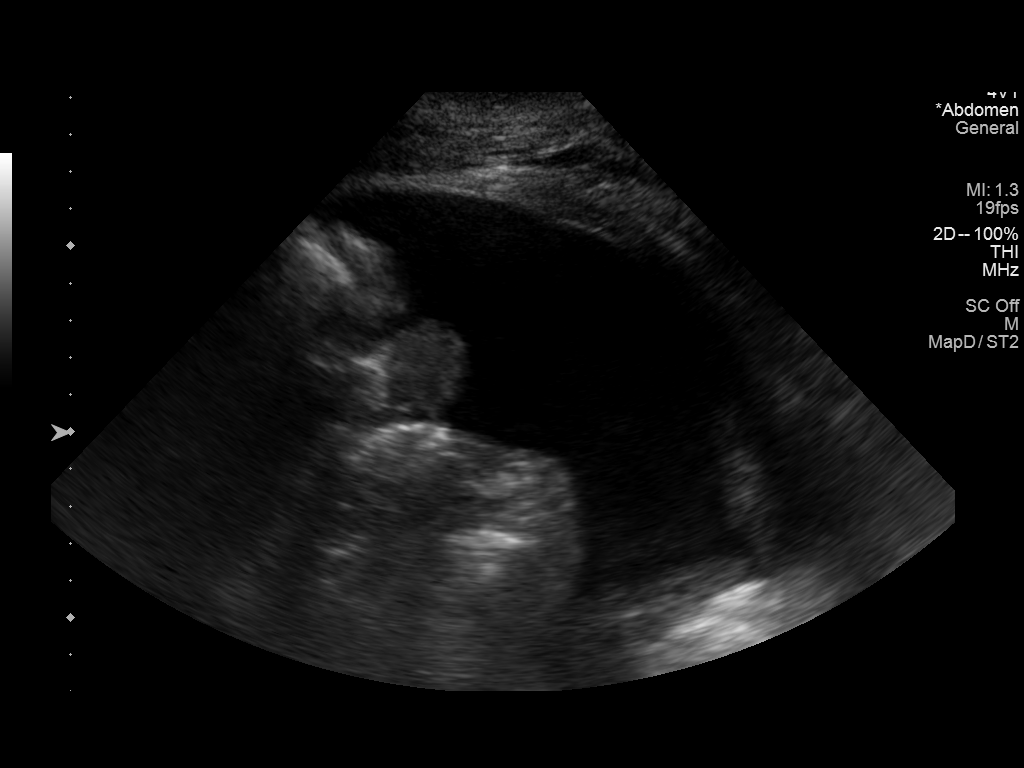
[im 3/3]
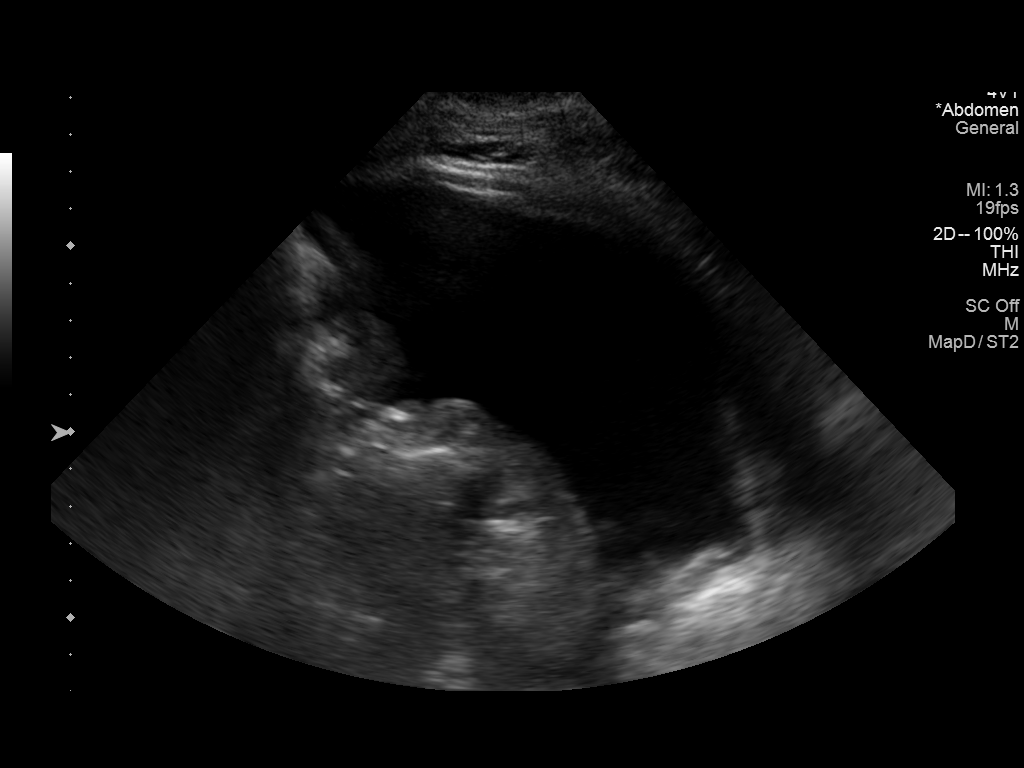

[3 of 3 positions shown; findings below may reference images not displayed]

FINDINGS: Small pockets of ascites noted in both lower quadrants. Ascites
volume not enough for safe paracentesis. Paracentesis not performed
at this time .
IMPRESSION: Small amount of ascites.  Paracentesis not performed at this time.

## 2017-07-10 IMAGING — DX DG CHEST 2V
2 series · 2 of 2 positions shown · non-contrast
Comparison: Portable chest x-ray November 22, 2016

CLINICAL DATA: Weakness, no chest complaints. Acute renal failure.
History of cirrhosis, former smoker.

EXAM:
CHEST  2 VIEW

[chest lat]
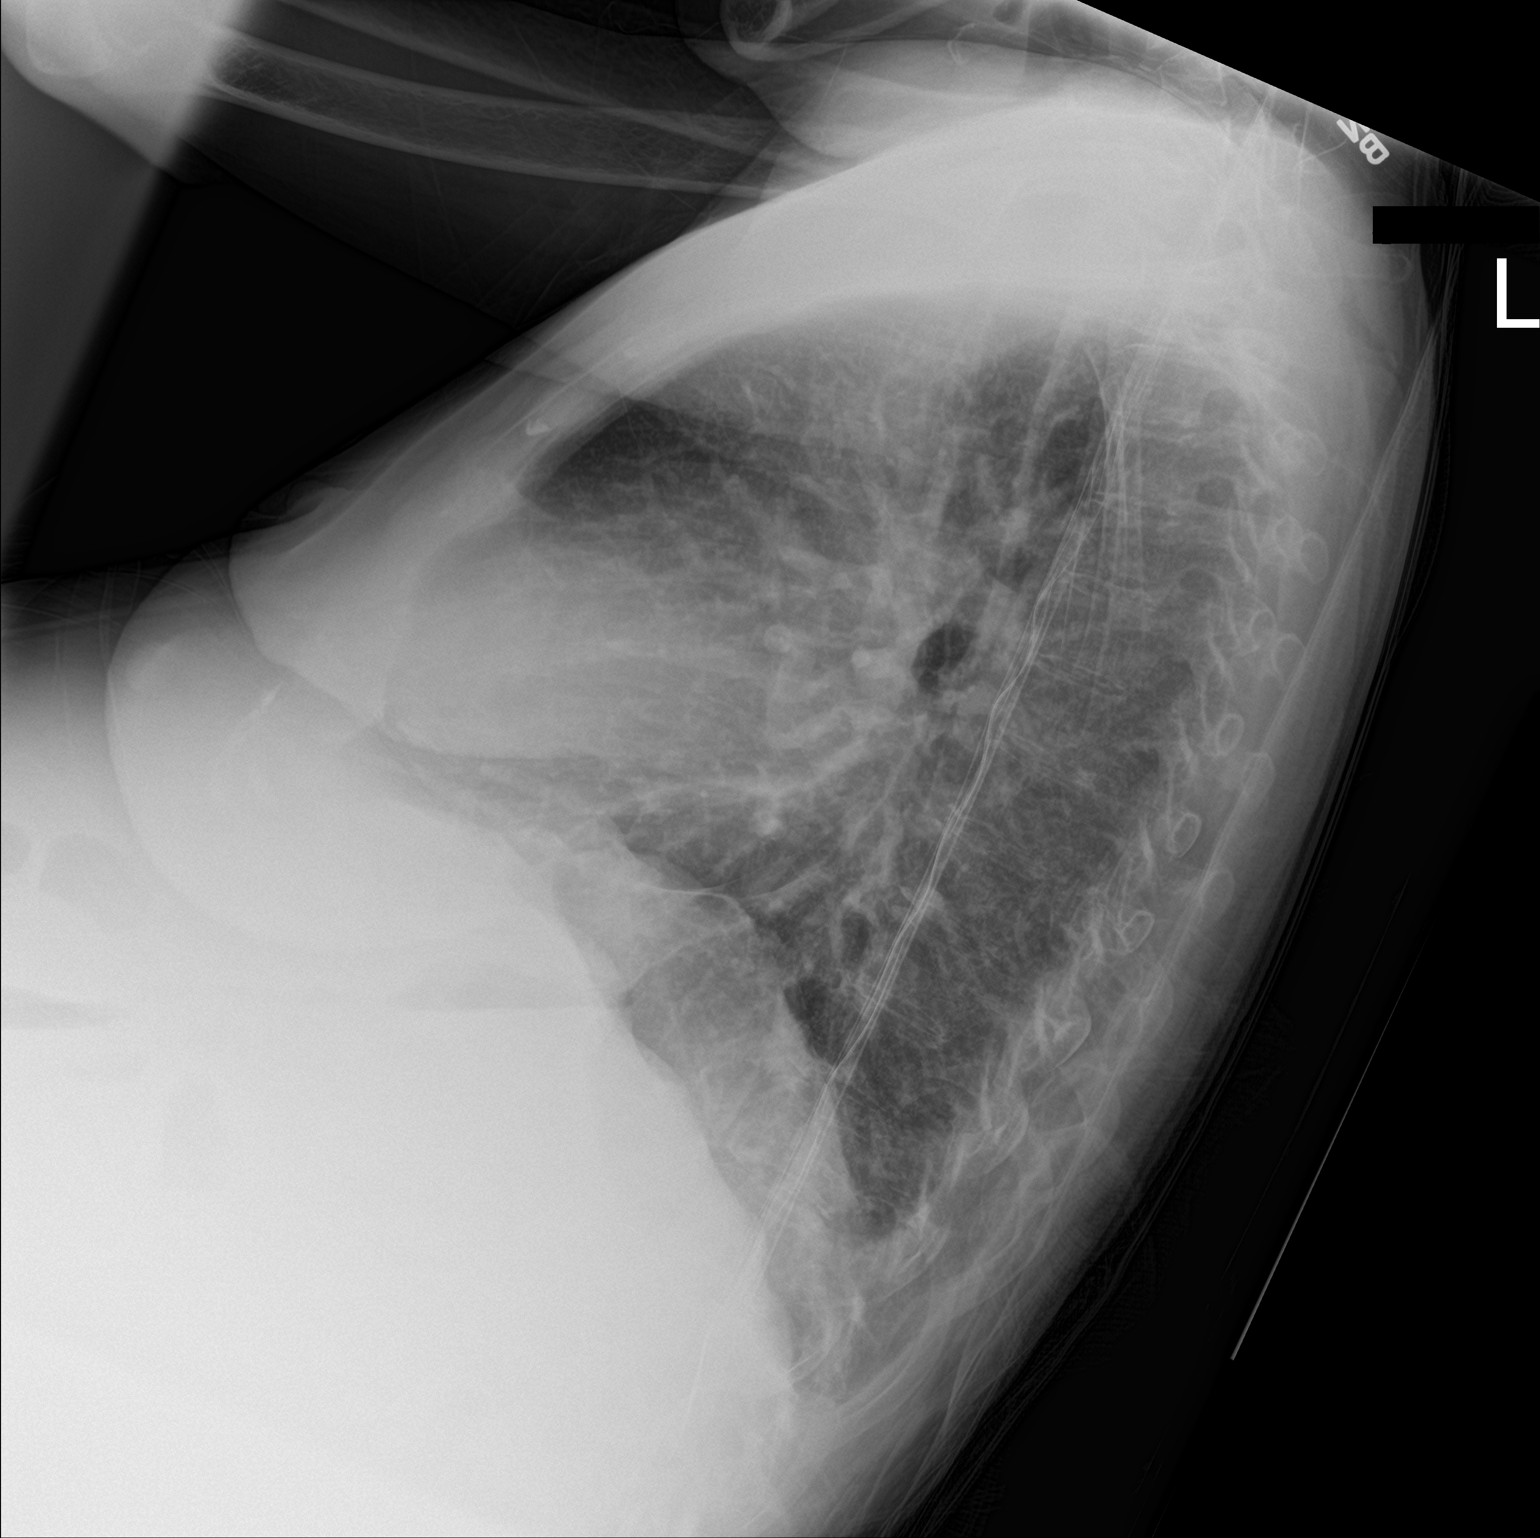

[chest ap]
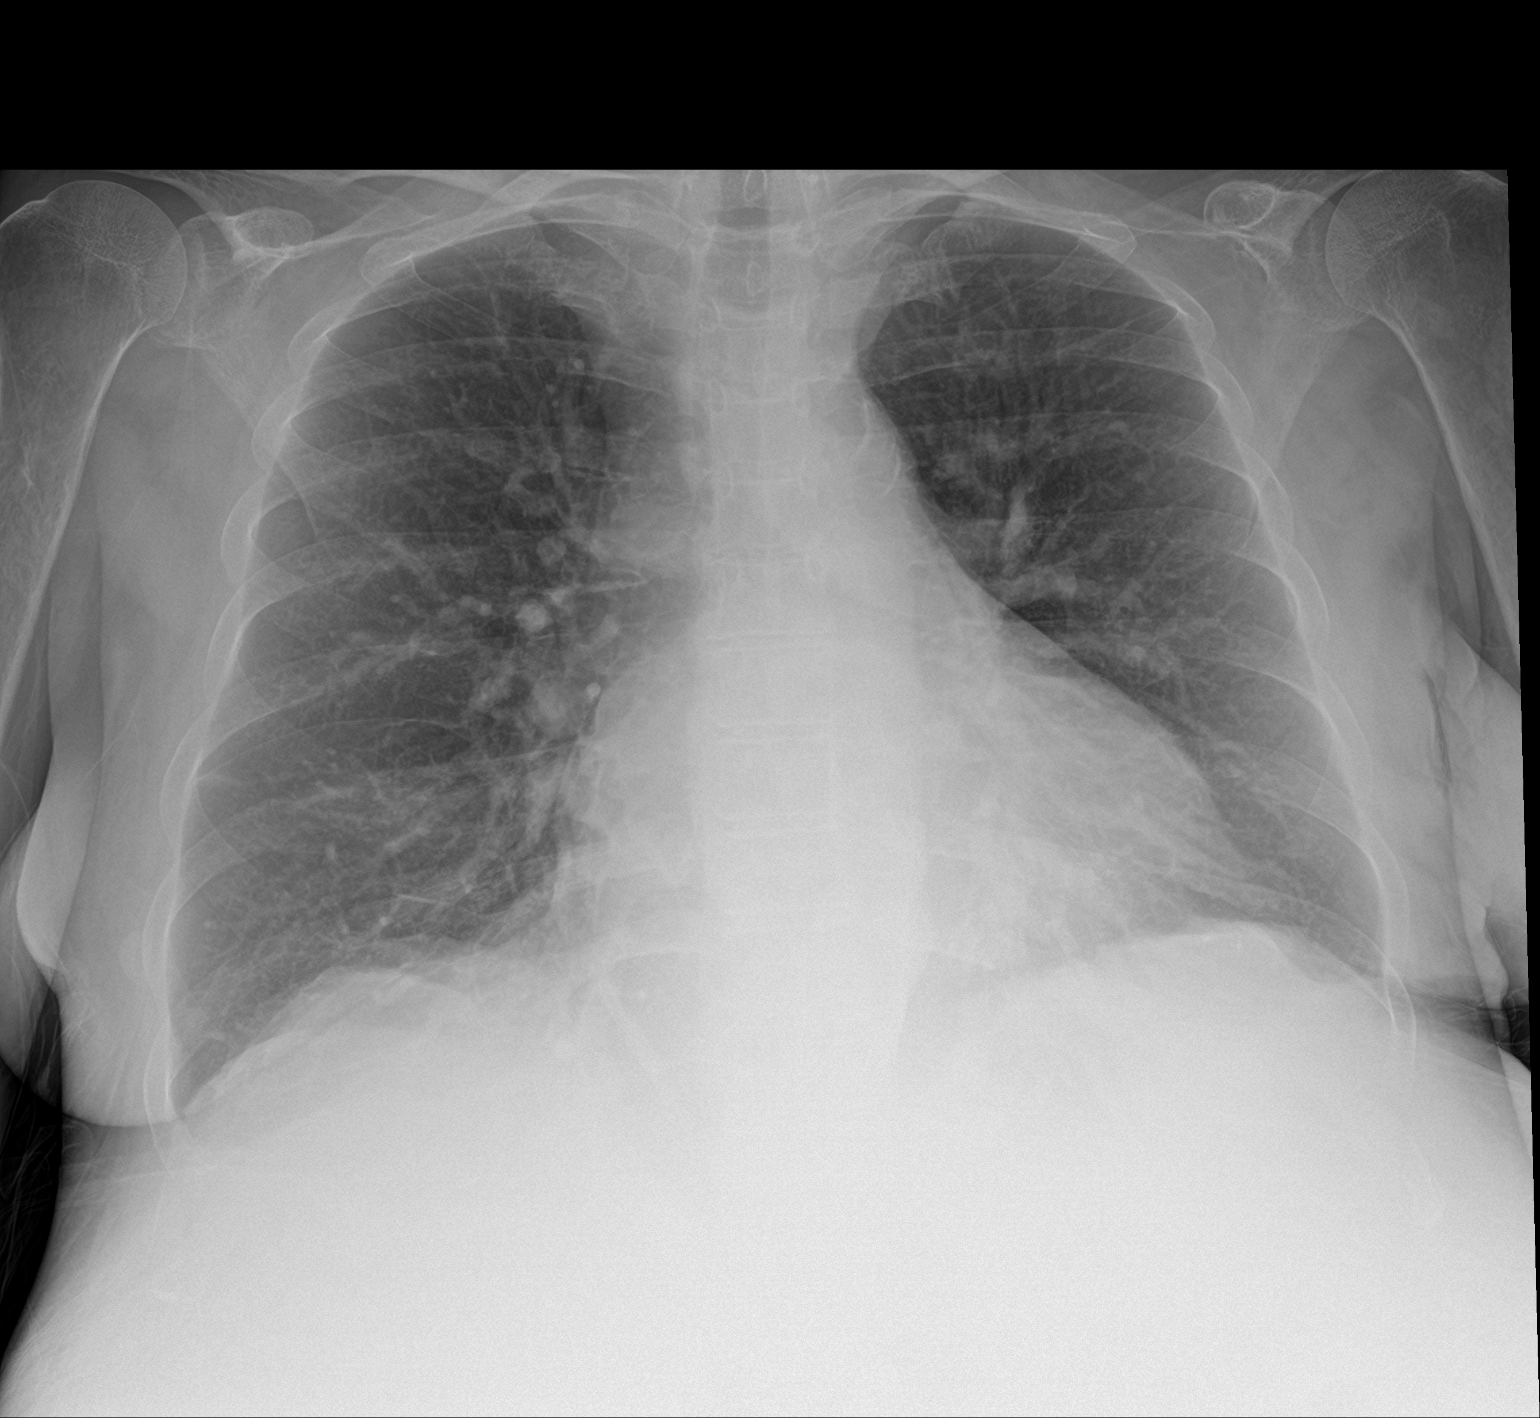

[2 of 2 positions shown; findings below may reference images not displayed]

FINDINGS: The lungs are adequately inflated. The pulmonary vascularity is
mildly prominent. The interstitial markings are less conspicuous
today. The cardiac silhouette is mildly enlarged. The retrocardiac
density has decreased. There is no significant pleural effusion.
There is calcification in the wall of the aortic arch. The
mediastinum is normal in width.
IMPRESSION: Chronic bronchitic changes, stable. Improving left lower lobe
atelectasis or infiltrate. Decreased pulmonary interstitial edema
and pulmonary vascular congestion consistent with improving CHF.

Thoracic aortic atherosclerosis.

## 2017-07-15 IMAGING — DX DG CHEST 2V
3 series · 3 of 3 positions shown · non-contrast
Comparison: Chest radiograph [DATE]

CLINICAL DATA: Leg and abdominal swelling, no dialysis since [REDACTED]. History of cirrhosis.

EXAM:
CHEST  2 VIEW

[chest pa]
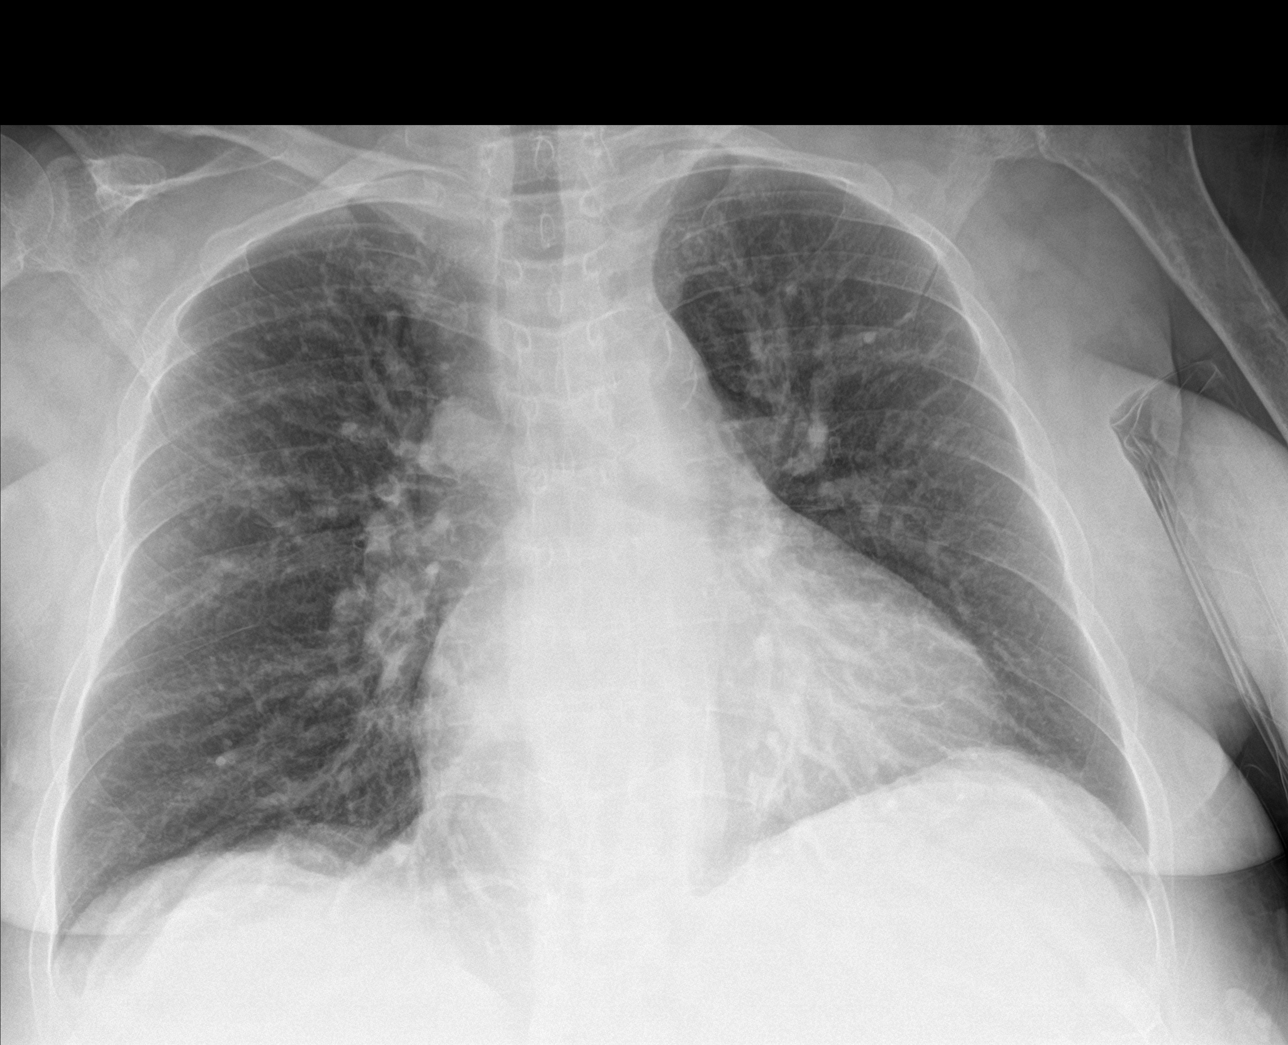

[chest lat (1 of 2)]
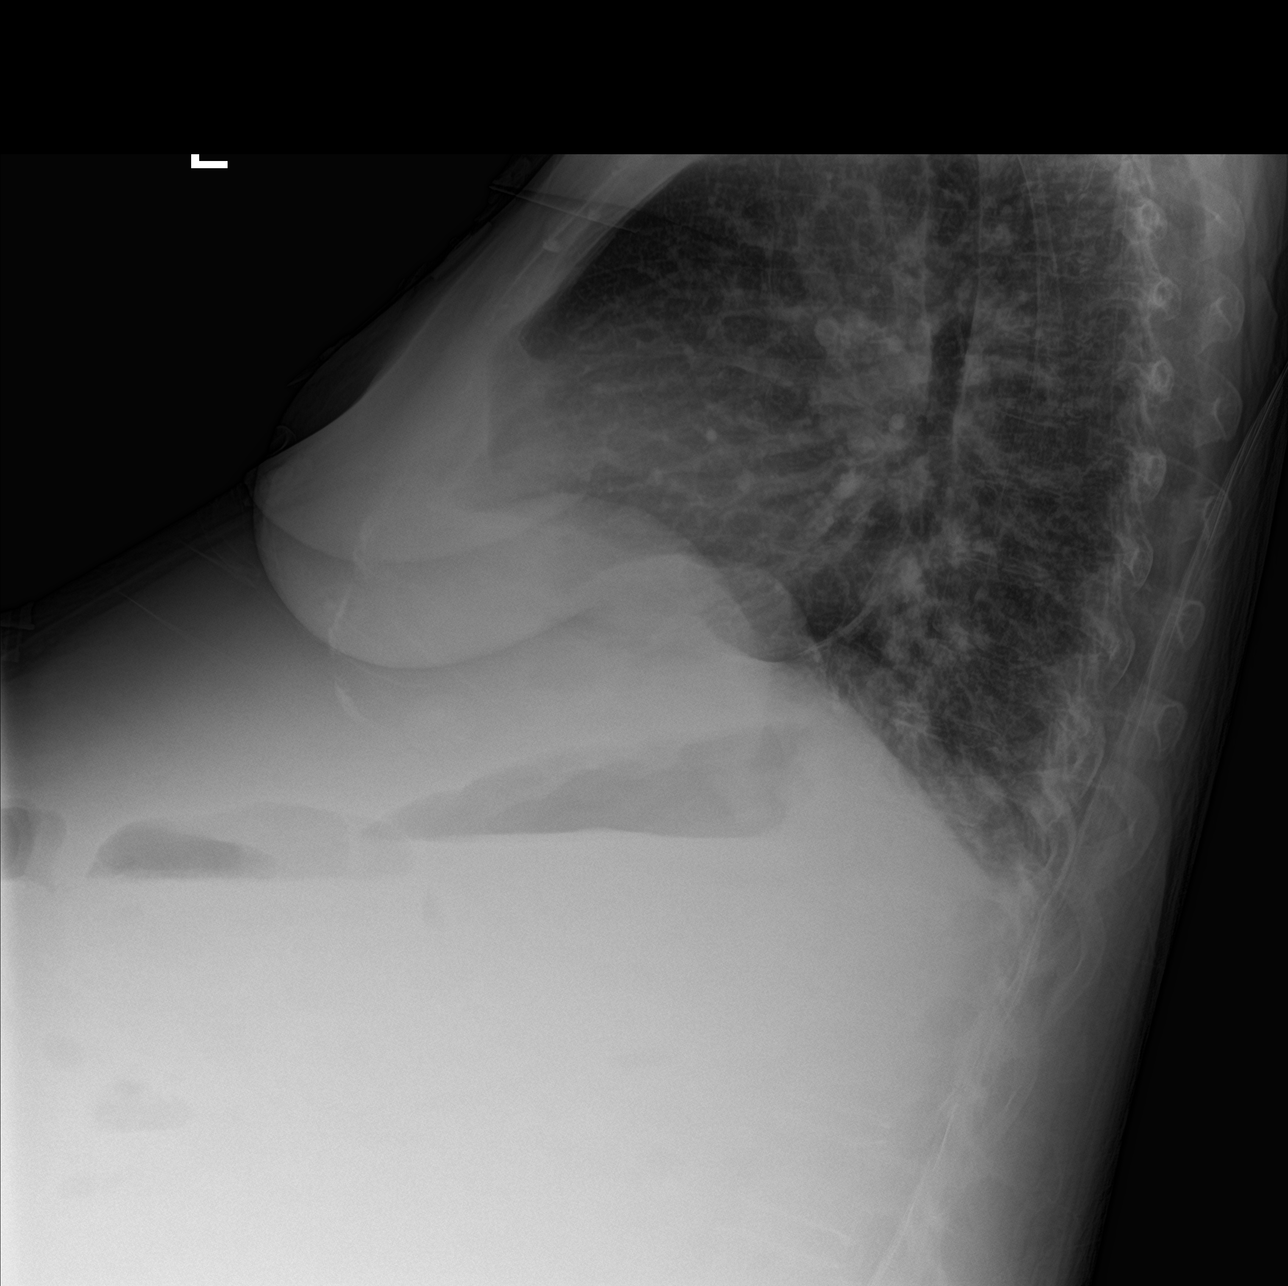

[chest lat (2 of 2)]
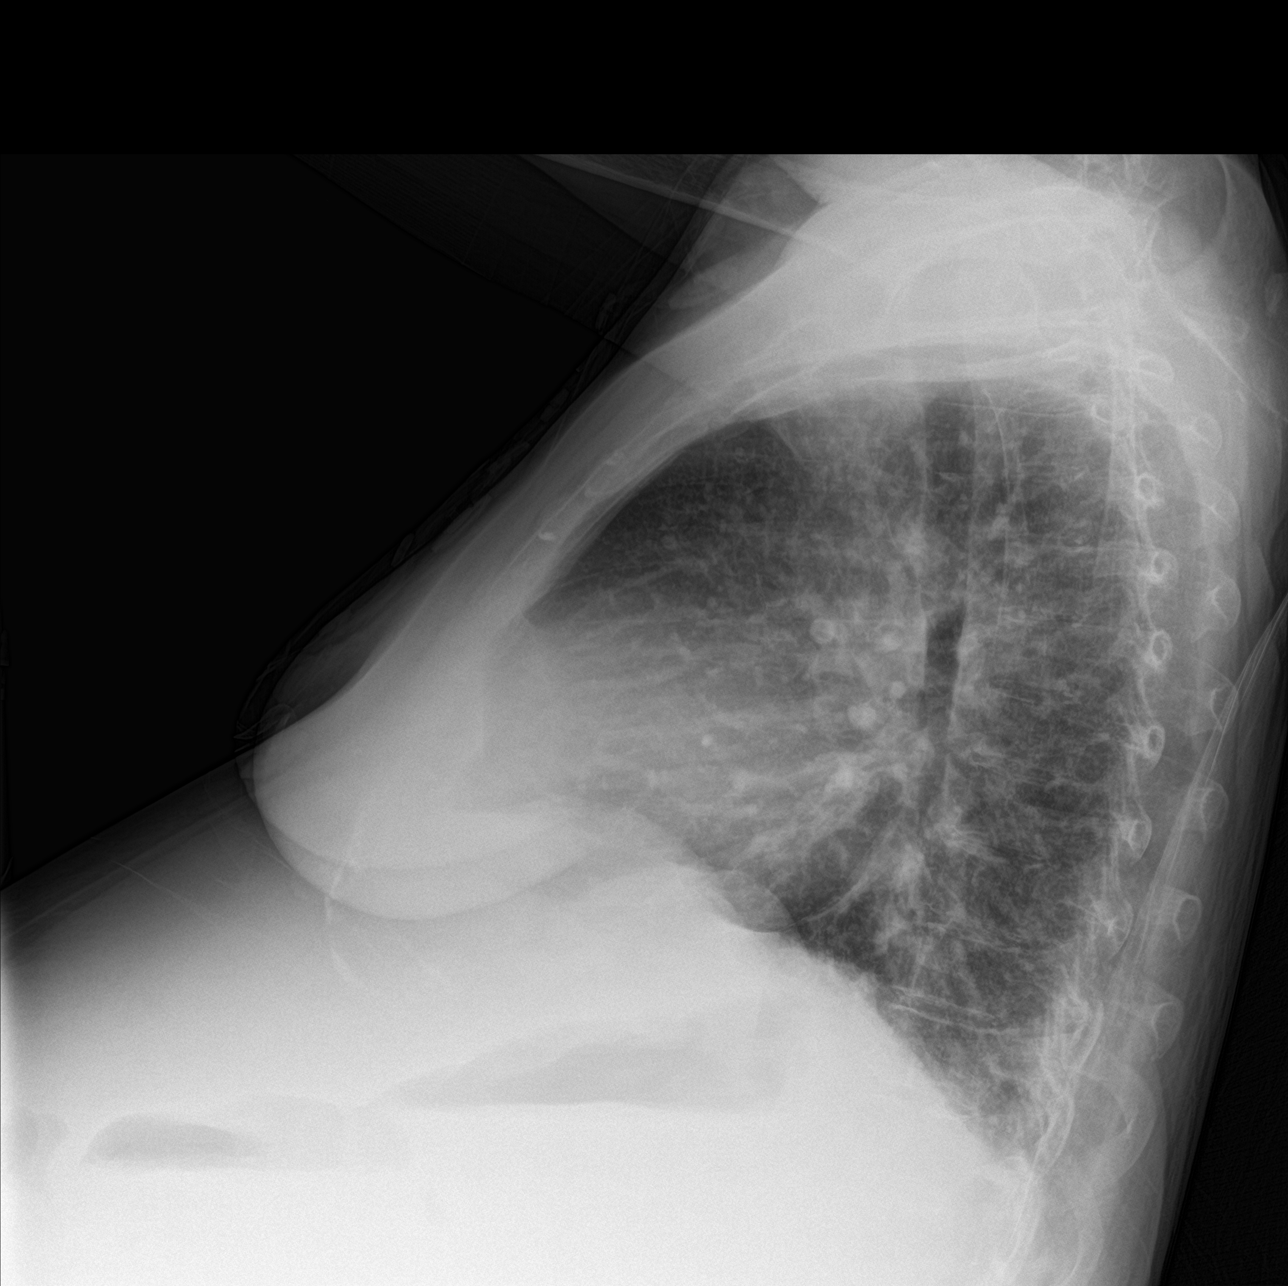

[3 of 3 positions shown; findings below may reference images not displayed]

FINDINGS: Cardiac silhouette is mildly enlarged. Mediastinal silhouette is
nonsuspicious. Calcified aortic knob. Similar pulmonary vascular
congestion and chronic bronchitic changes without pleural effusion
or focal consolidation Included abdomen demonstrates distention with
bowel air-fluid levels at a similar level.
IMPRESSION: Stable cardiomegaly and pulmonary vascular congestion. Mild
bronchitic changes without focal consolidation.

Suspected ascites.

## 2017-07-15 IMAGING — DX DG HIP (WITH OR WITHOUT PELVIS) 2-3V*R*
4 series · 4 of 4 positions shown · non-contrast
Comparison: None.

CLINICAL DATA: Leg and abdominal swelling, no dialysis since [REDACTED]. History of cirrhosis.

EXAM:
DG HIP (WITH OR WITHOUT PELVIS) 2-3V RIGHT

[pelvis ap (1 of 2)]
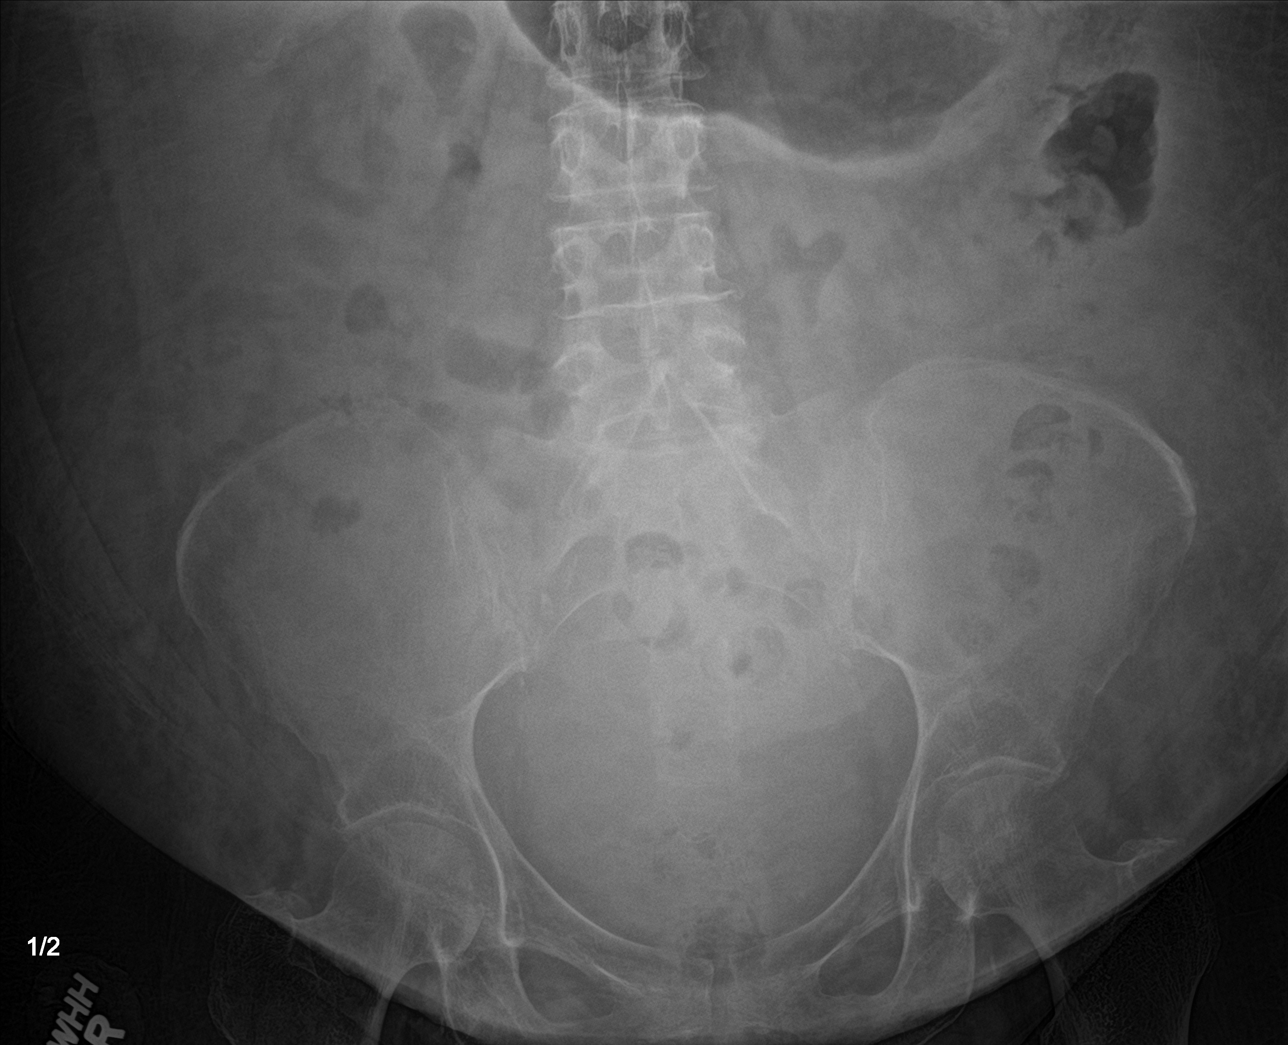

[hip ap]
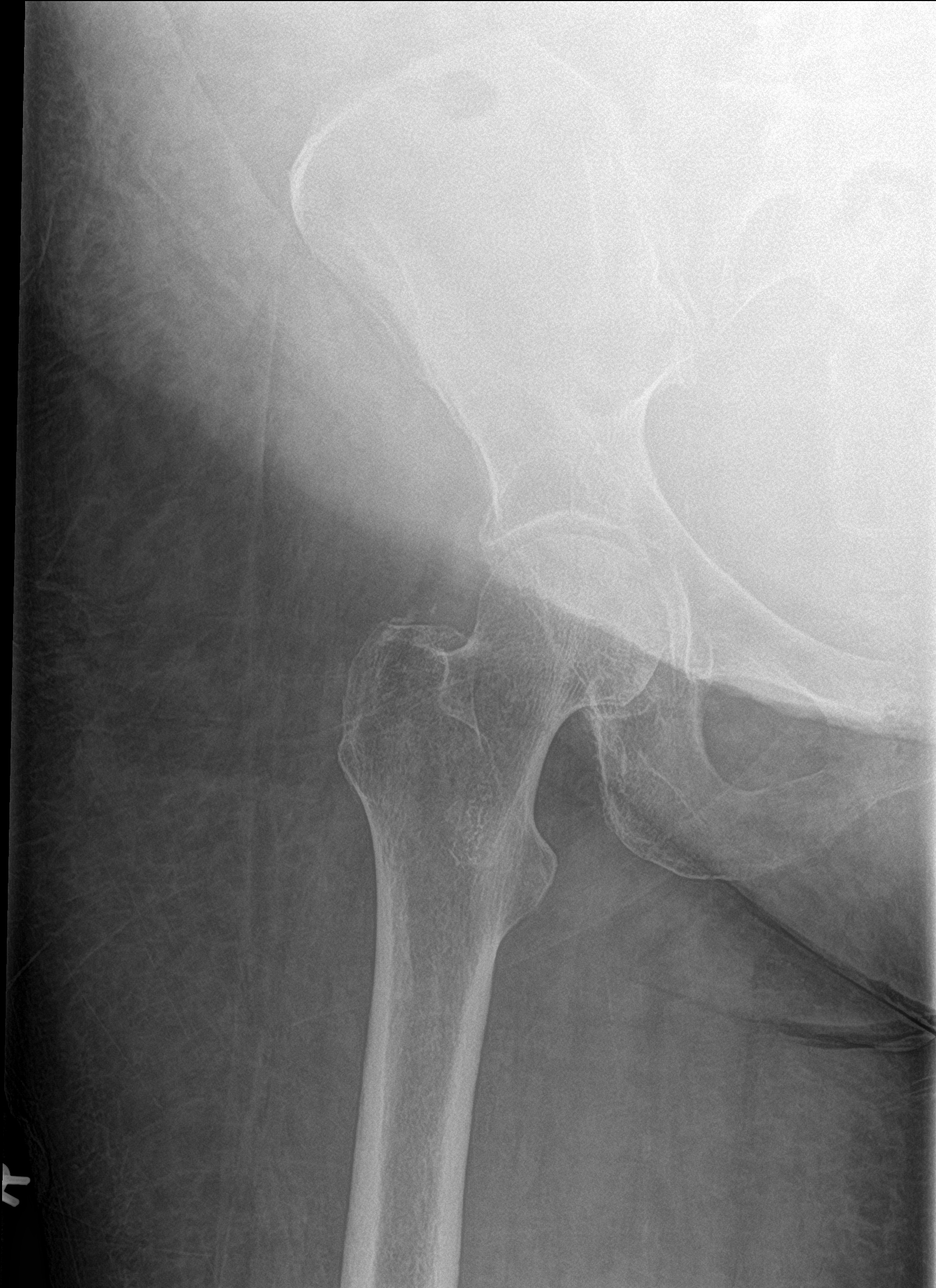

[hip lat]
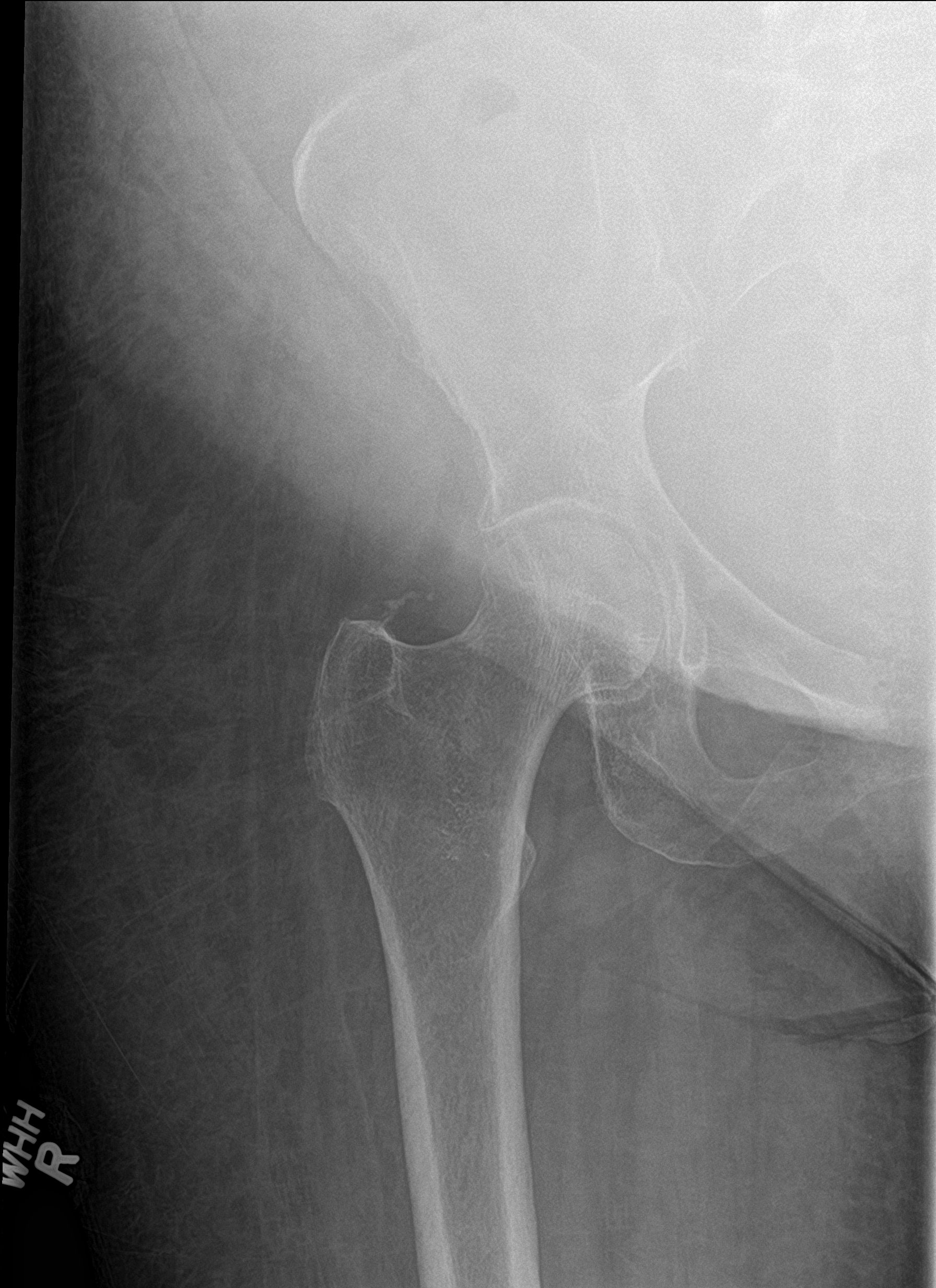

[pelvis ap (2 of 2)]
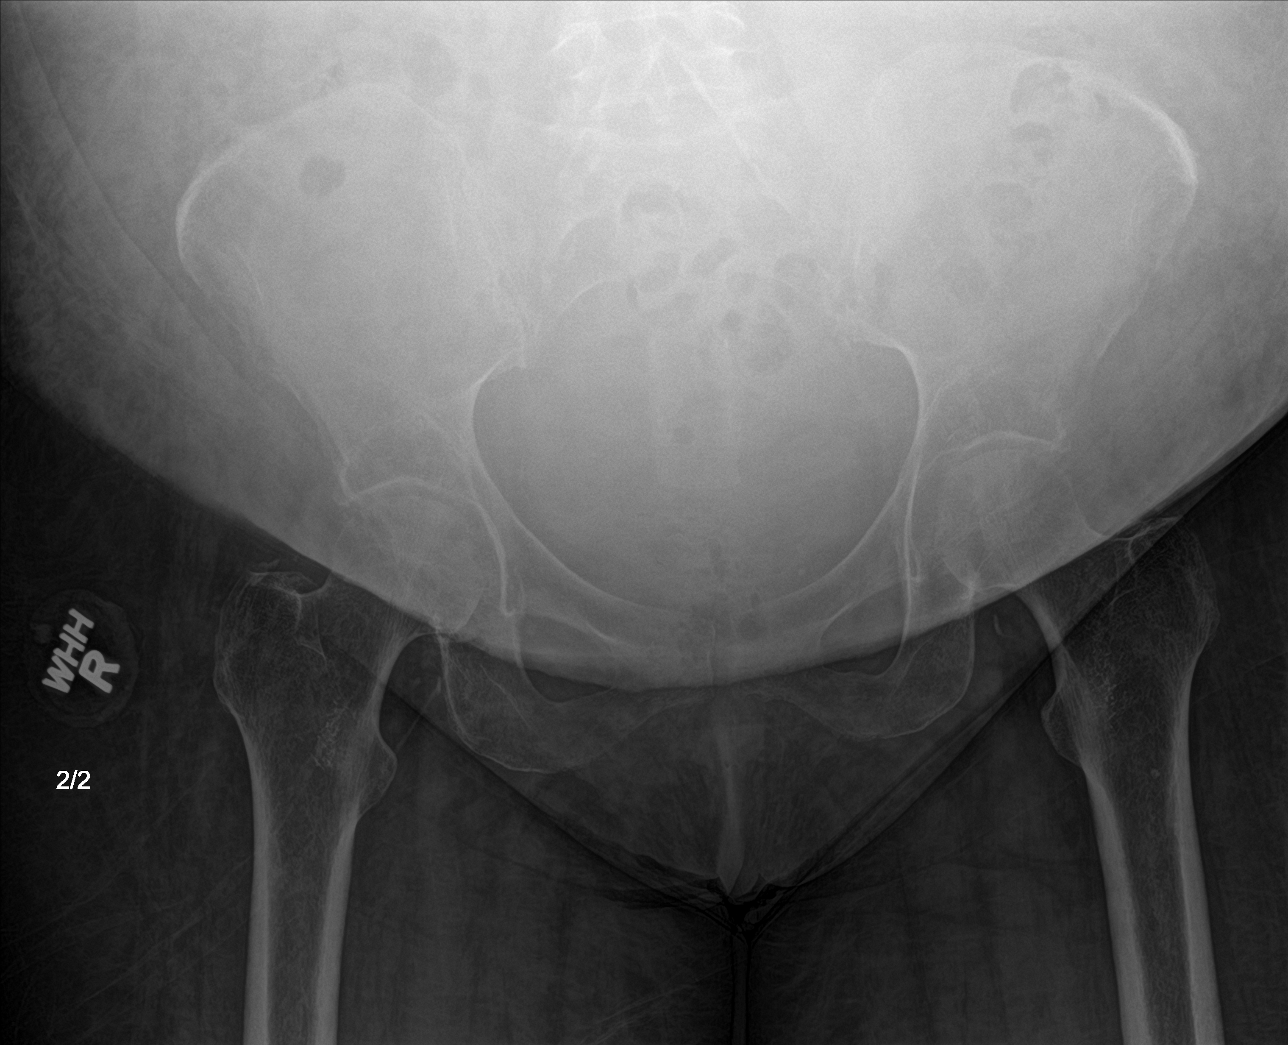

[4 of 4 positions shown; findings below may reference images not displayed]

FINDINGS: There is no evidence of hip fracture or dislocation. There is no
evidence of arthropathy or other focal bone abnormality. Moderate
vascular calcifications. Large body habitus.
IMPRESSION: No acute osseous process.

## 2017-07-16 IMAGING — US US PARACENTESIS
1 series · 6 of 6 positions shown · non-contrast
Comparison: none

INDICATION: Cirrhosis, ascites

[Series 1: us paracentesis · 0.32mm/px · 6 of 6 slices shown]
[im 1/6]
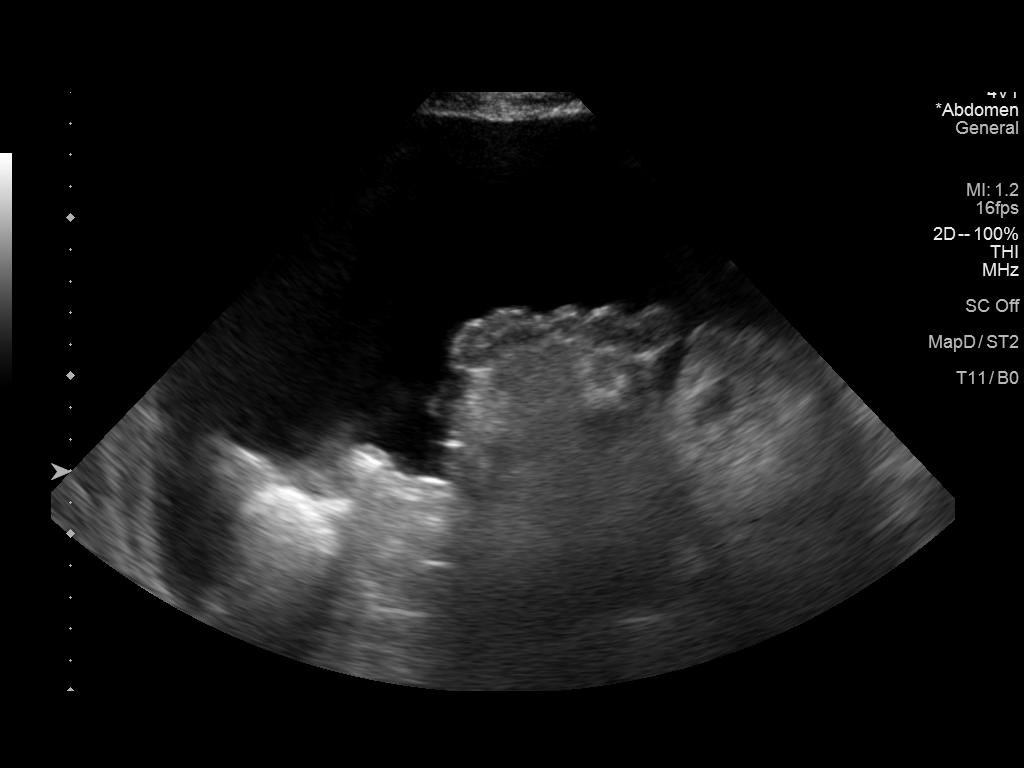
[im 2/6]
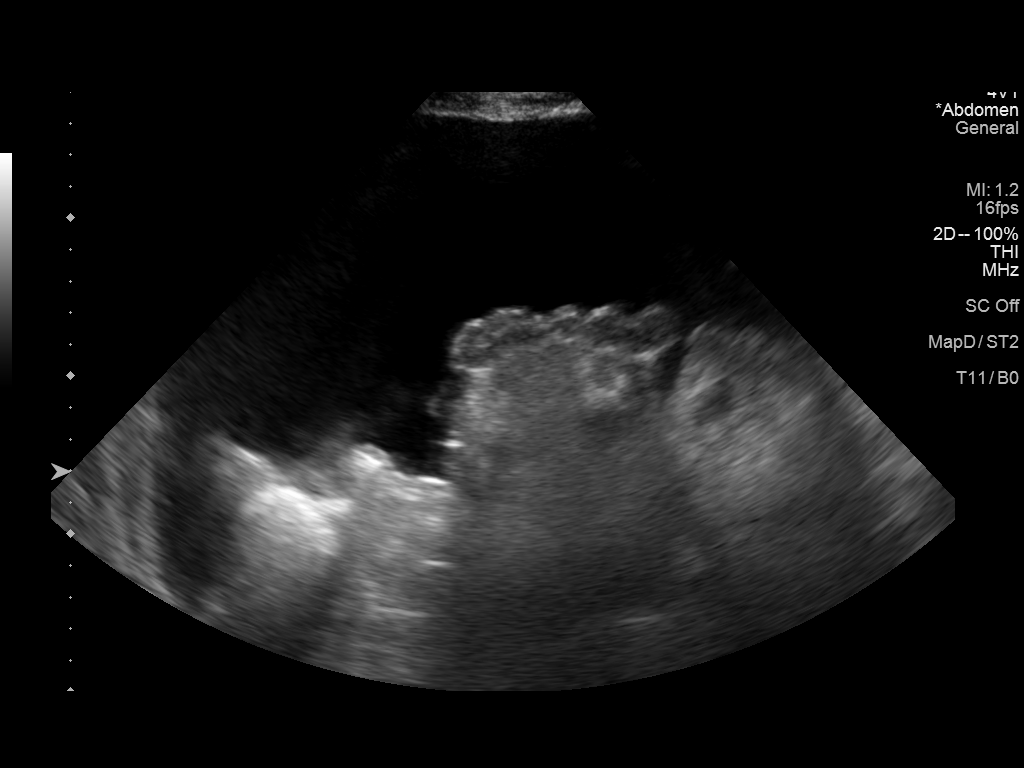
[im 3/6]
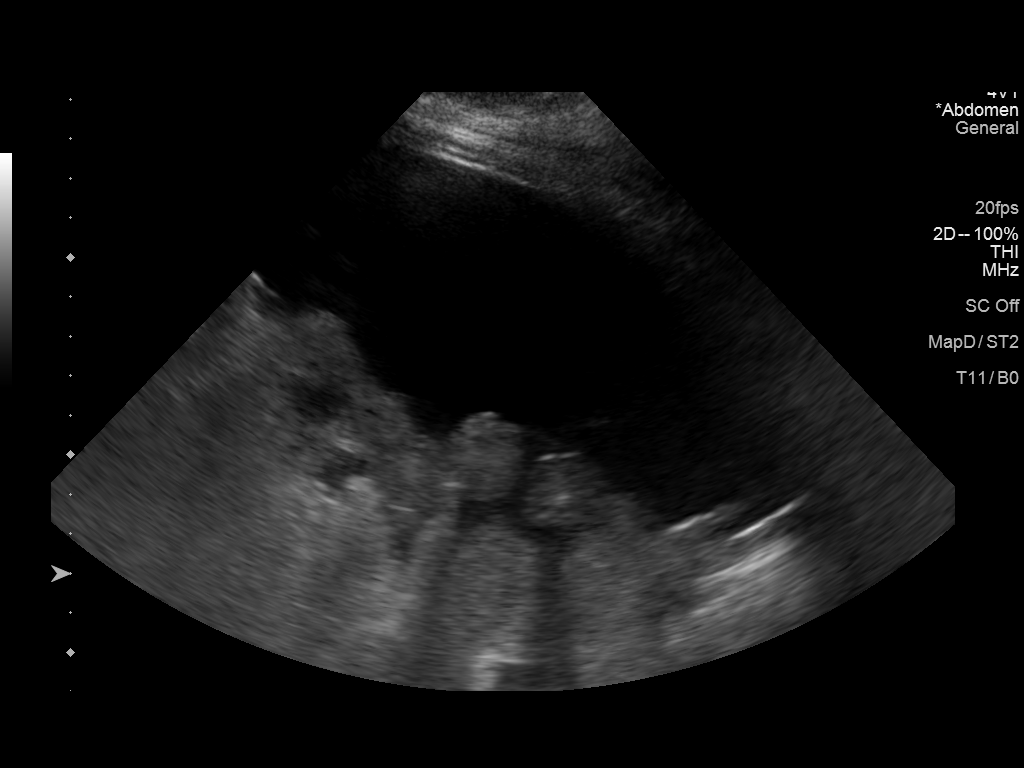
[im 4/6]
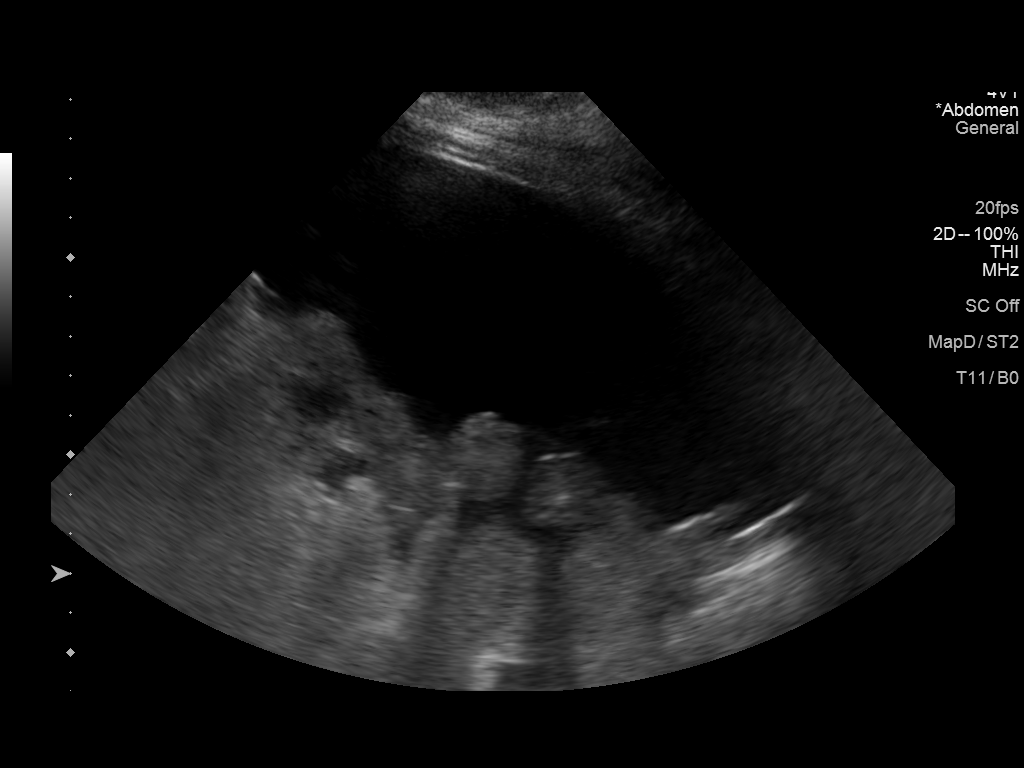
[im 5/6]
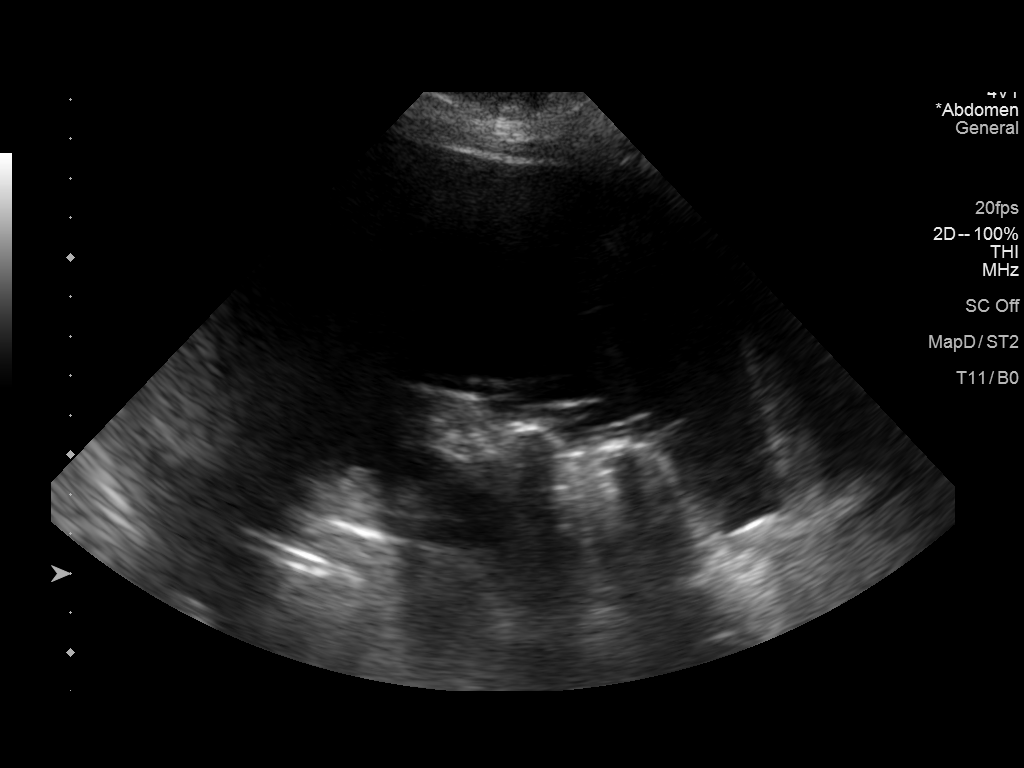
[im 6/6]
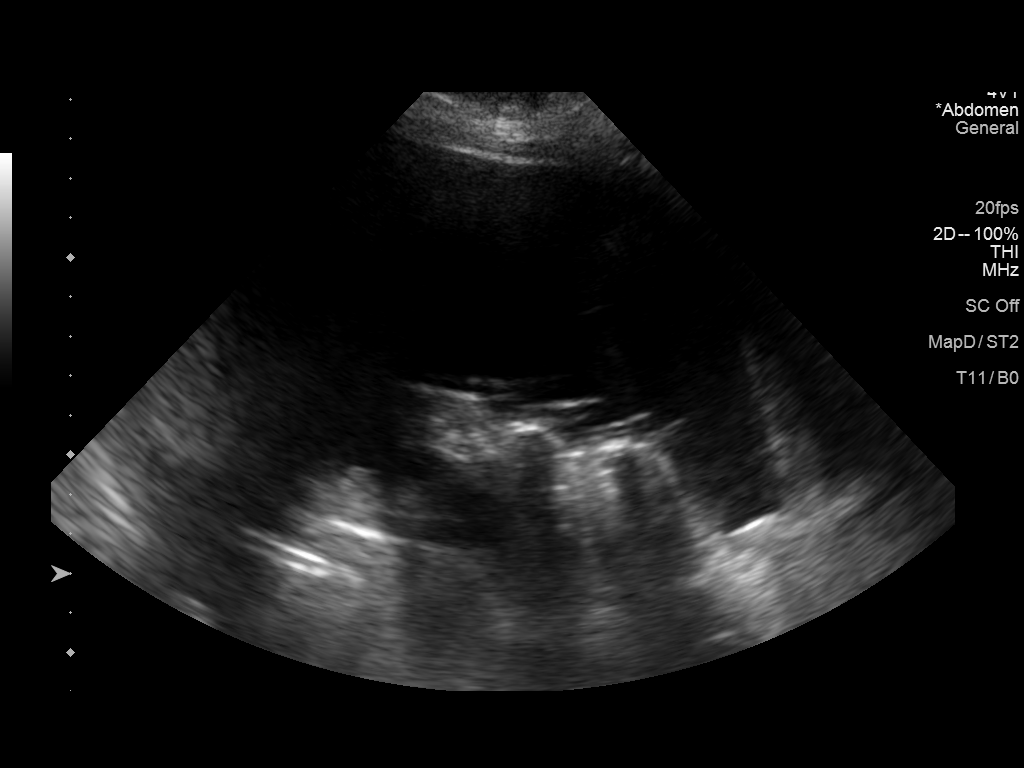

[6 of 6 positions shown; findings below may reference images not displayed]

EXAM:
ULTRASOUND GUIDED THERAPEUTIC PARACENTESIS

MEDICATIONS:
None.

COMPLICATIONS:
None immediate.

PROCEDURE:
Procedure, benefits, and risks of procedure were discussed with
patient.

Written informed consent for procedure was obtained.

Time out protocol followed.

Adequate collection of ascites localized by ultrasound in RIGHT
lower quadrant.

Skin prepped and draped in usual sterile fashion.

Skin and soft tissues anesthetized with 10 mL of 1% lidocaine.

5 French Yueh catheter placed into peritoneal cavity.

5.4 L of yellow fluid aspirated by vacuum bottle suction.

Procedure tolerated well by patient without immediate complication.
FINDINGS: As above
IMPRESSION: Successful ultrasound-guided paracentesis yielding 5.4 liters of
peritoneal fluid.

## 2017-07-24 IMAGING — DX DG TIBIA/FIBULA 2V*L*
4 series · 4 of 4 positions shown · non-contrast
Comparison: None.

CLINICAL DATA: Pain and swelling of the left lower leg.  Blisters.

EXAM:
LEFT TIBIA AND FIBULA - 2 VIEW

[tibia ap (1 of 2)]
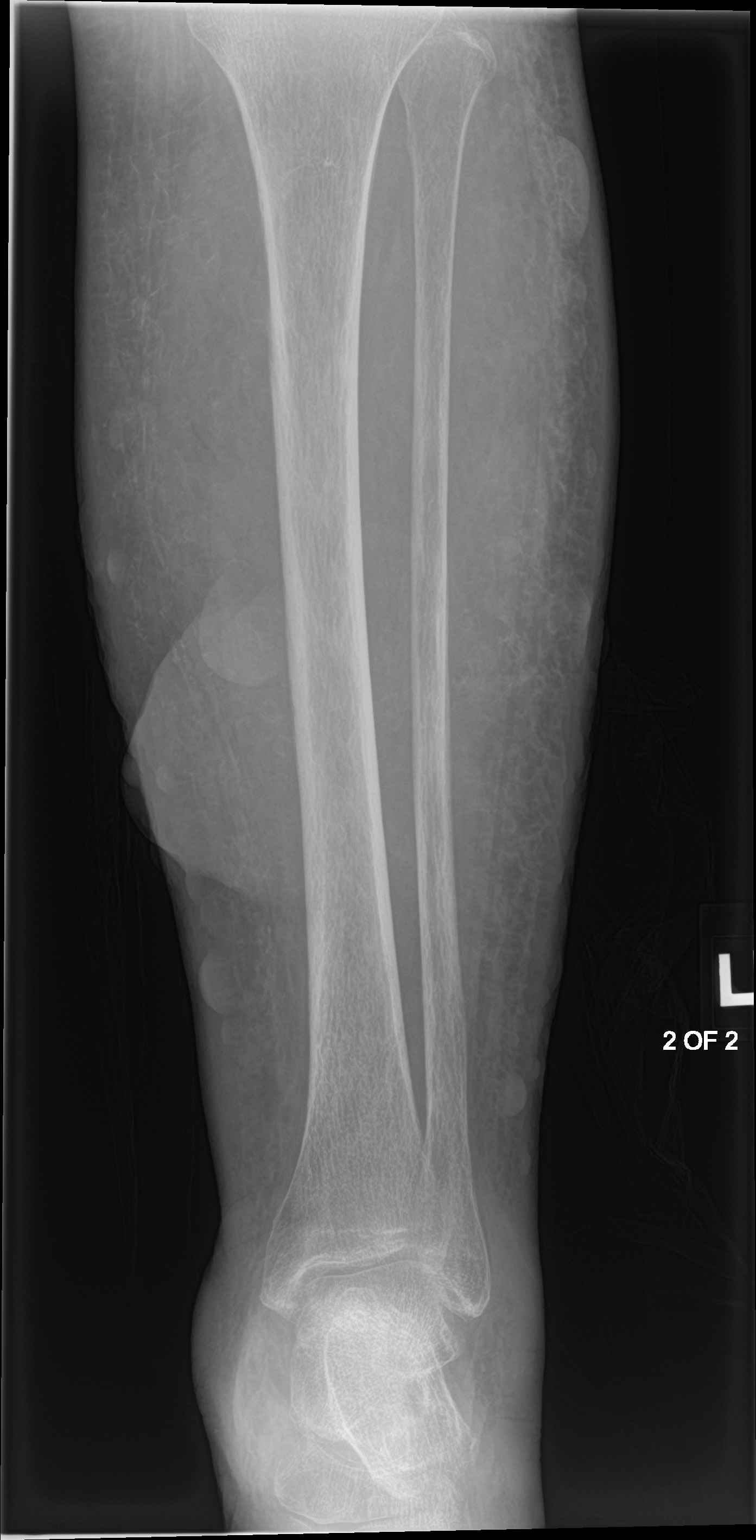

[tibia ap (2 of 2)]
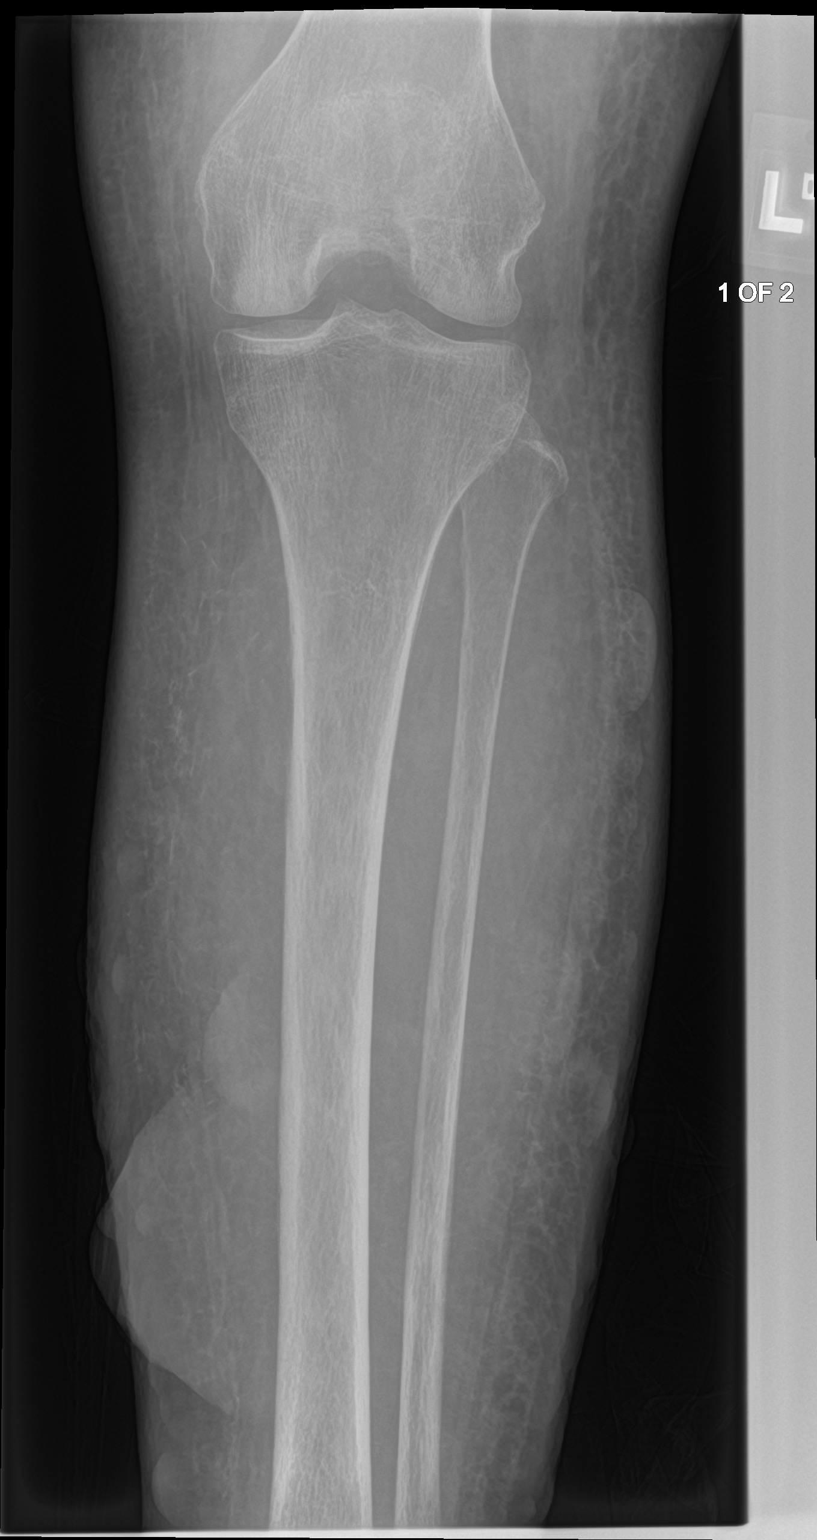

[tibia lat (1 of 2)]
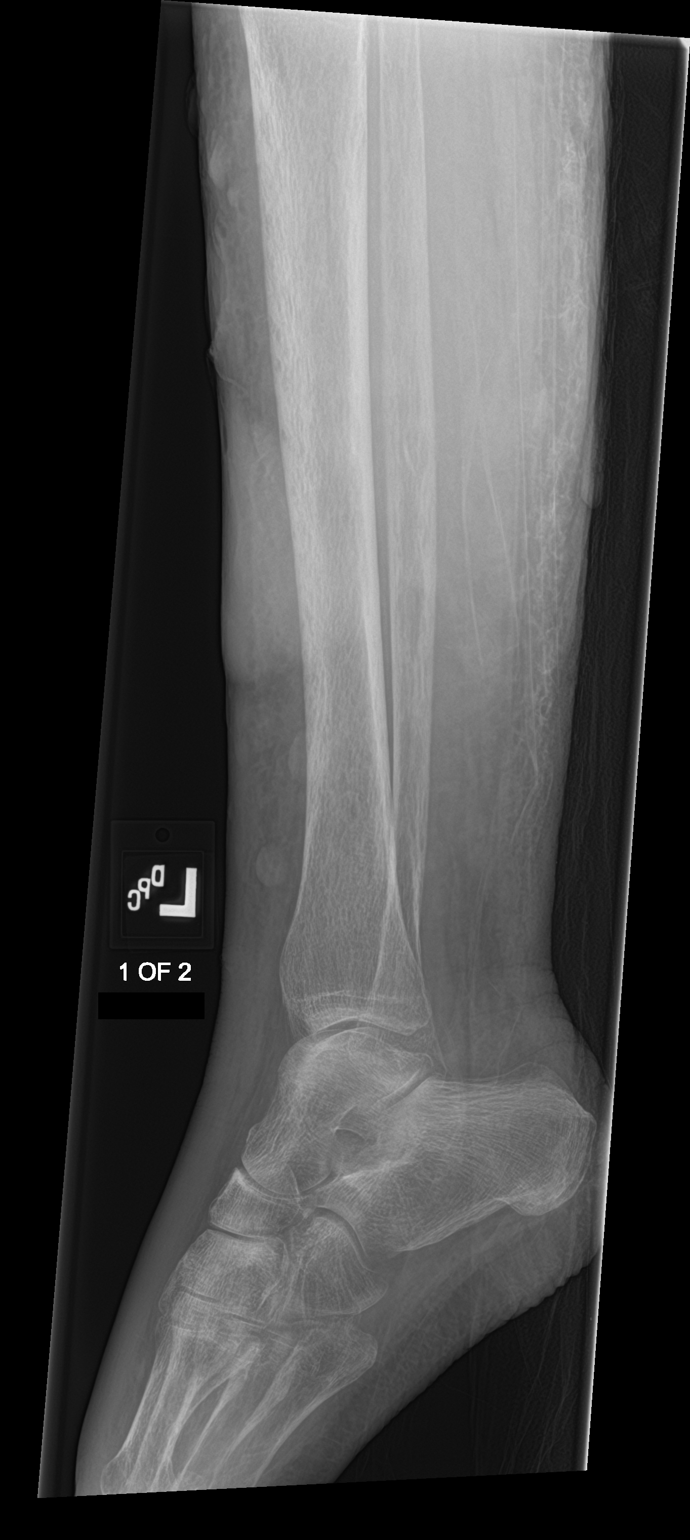

[tibia lat (2 of 2)]
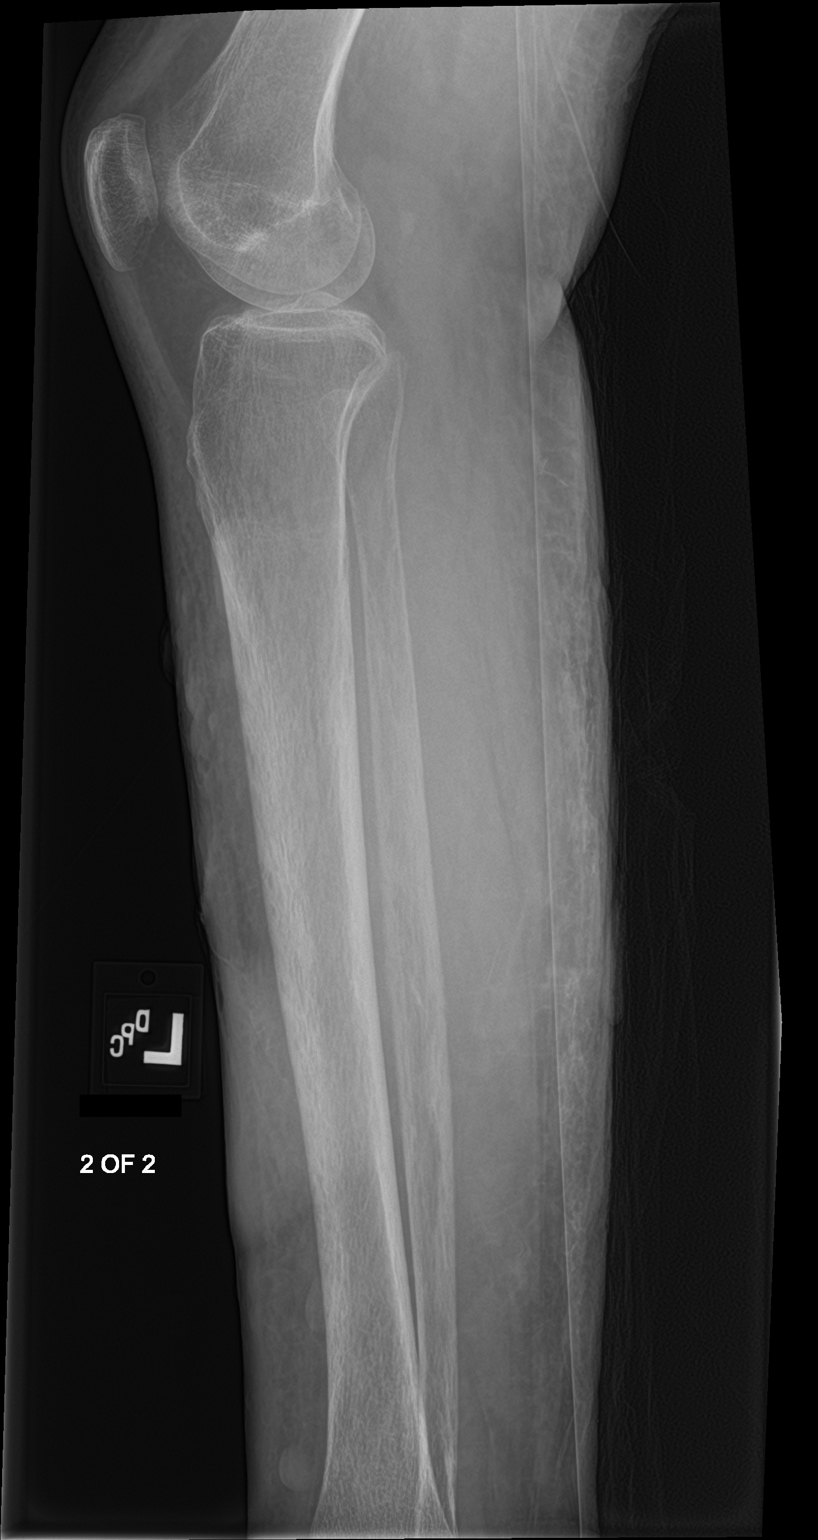

[4 of 4 positions shown; findings below may reference images not displayed]

FINDINGS: No acute fracture nor bone destruction of the left tibia nor fibula.
Joint spaces are maintained. Cutaneous nodular densities consistent
with reported soft tissue blisters are seen with soft tissue
induration potentially representing cellulitis or possibly changes
of venous insufficiency.
IMPRESSION: 1. Negative for acute fracture nor bone destruction. Joint
dislocations.
2. Soft tissue induration with cutaneous masslike abnormalities
consistent with reported history of blisters. Cellulitis or venous
insufficiency might account for the subcutaneous soft tissue
induration.

## 2017-08-14 IMAGING — CR DG CHEST 1V SAME DAY
1 series · 1 of 1 positions shown · non-contrast
Comparison: 3343 hours on the same day

CLINICAL DATA: PICC line placement

EXAM:
CHEST - 1 VIEW SAME DAY

[ap portable]
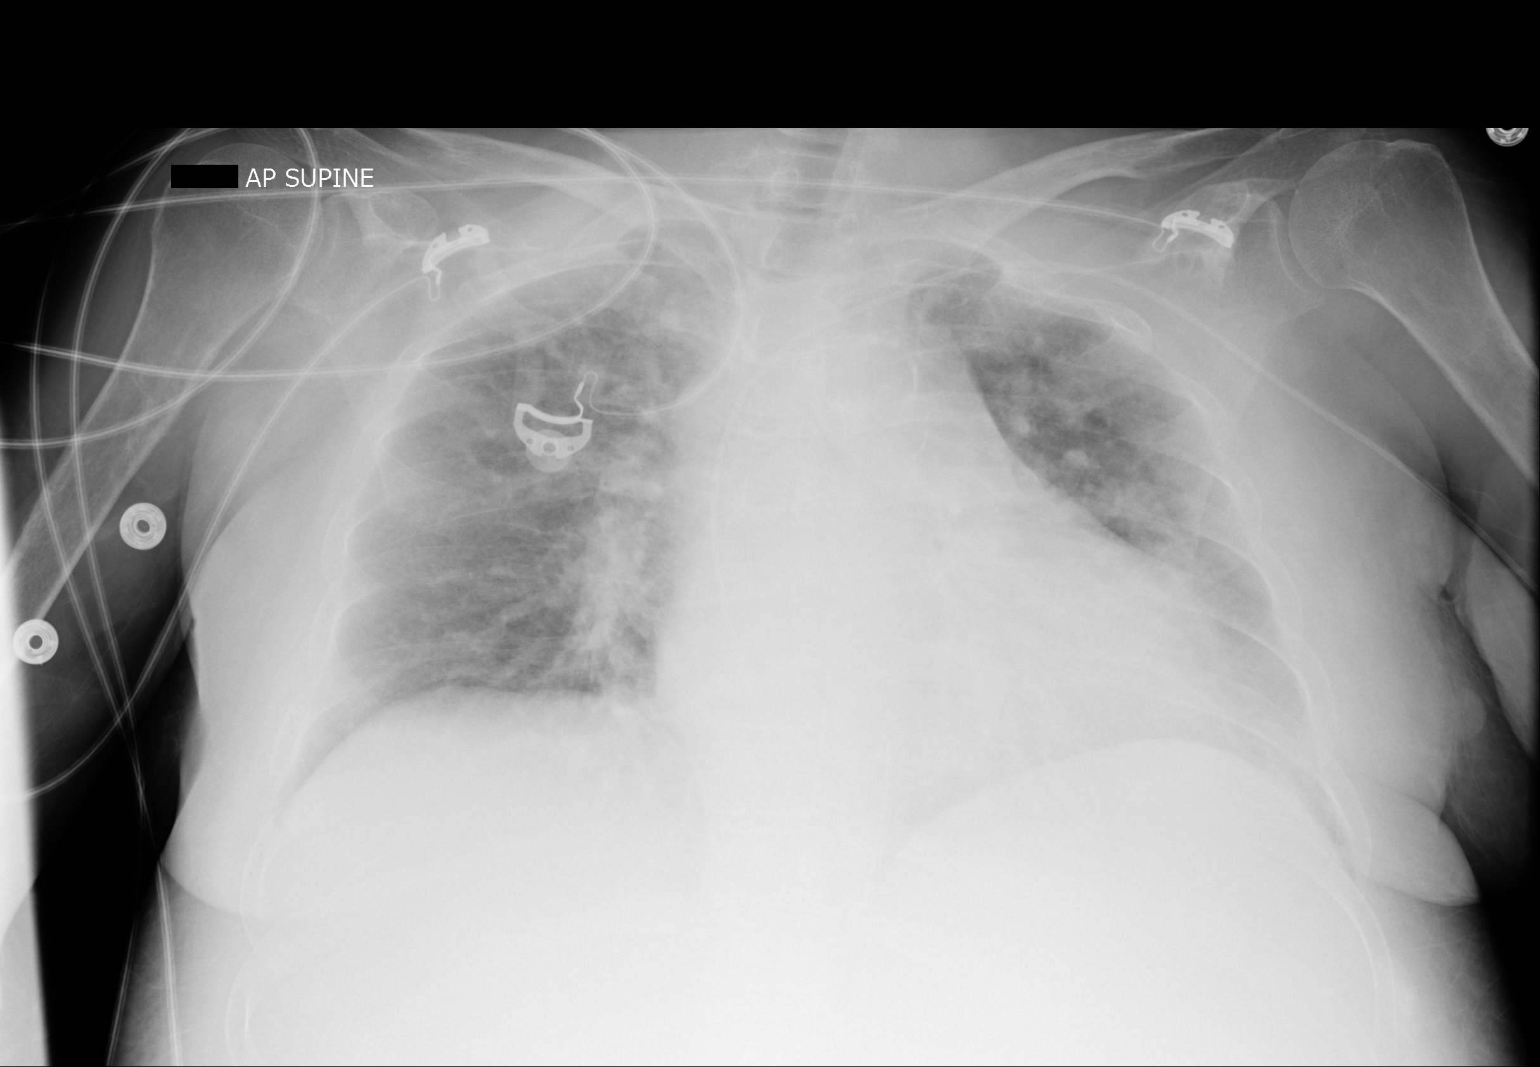

[1 of 1 positions shown; findings below may reference images not displayed]

FINDINGS: Left-sided PICC line tip projects over the expected location of the
proximal right atrium. Pullback approximately 2 cm. Stable
cardiomegaly with aortic atherosclerosis and mild CHF. Low lung
volumes are noted. No acute nor suspicious osseous lesions.
IMPRESSION: 1. Tip of left-sided PICC line tip projects over the proximal right
atrium. Pullback approximately 2 cm.
2. Cardiomegaly with aortic atherosclerosis and mild CHF.

## 2017-08-14 IMAGING — CR DG CHEST 1V PORT
1 series · 1 of 1 positions shown · non-contrast
Comparison: 12/22/2016

CLINICAL DATA: Altered mentation and wheeze

EXAM:
PORTABLE CHEST 1 VIEW

[portable]
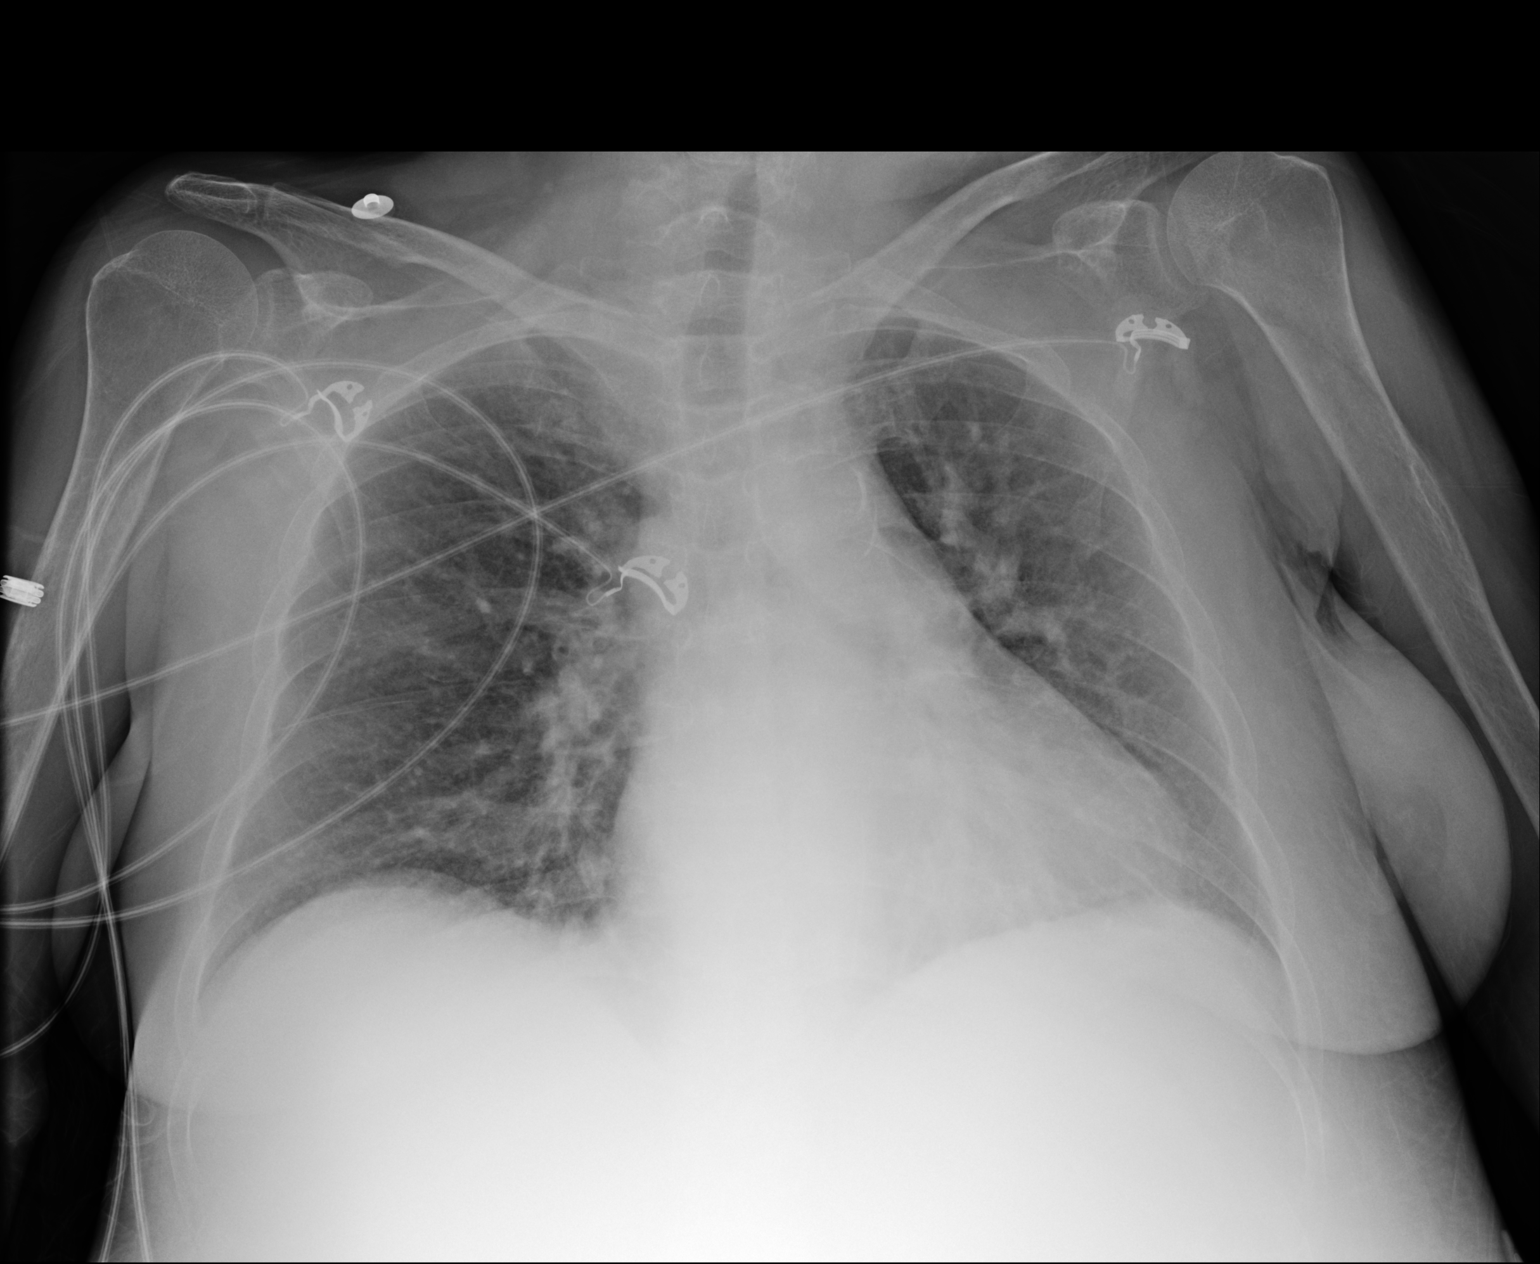

[1 of 1 positions shown; findings below may reference images not displayed]

FINDINGS: Borderline cardiomegaly with aortic atherosclerosis. Slight interval
increase in pulmonary vascular congestion consistent mild CHF. No
pneumonic consolidation, effusion or pneumothorax. Minimal
atelectasis and/or scarring at the right lung base. No acute nor
suspicious osseous lesions.
IMPRESSION: Interval increase in pulmonary vascular congestion consistent with
mild CHF. Stable aortic atherosclerosis

## 2017-08-15 IMAGING — CT CT HEAD W/O CM
3 series · 15 of 46 positions shown, 18 images · non-contrast
Comparison: 09/29/2016

CLINICAL DATA: Altered mental status.  Ventilator dependence.

EXAM:
CT HEAD WITHOUT CONTRAST
TECHNIQUE: Contiguous axial images were obtained from the base of the skull
through the vertex without intravenous contrast.

[Series 2: head trauma wo · axial · 0.43mm/px · z∈[+25,+145]mm · 9 of 29 slices shown, 12 images]
[im 3/29  brain]
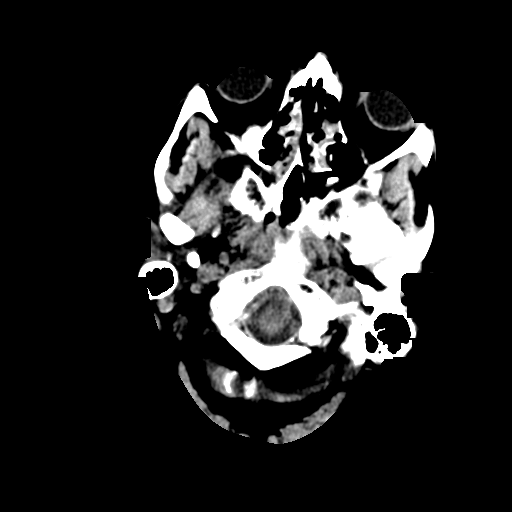
[im 3/29  bone]
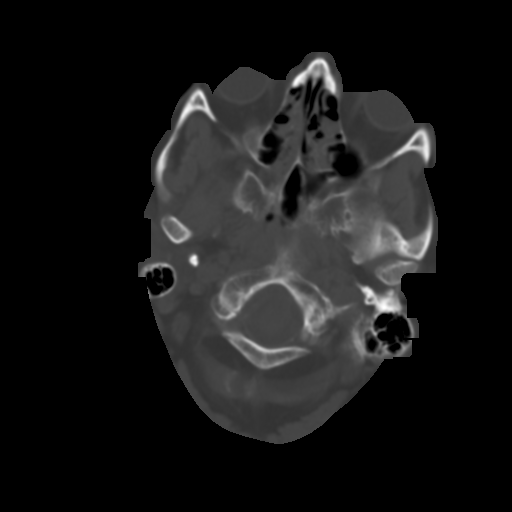
[im 6/29  brain]
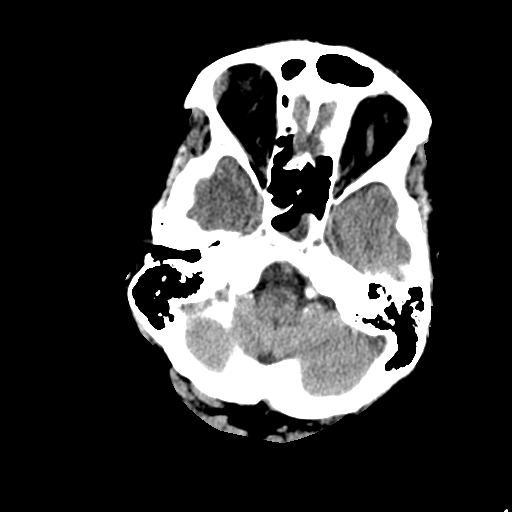
[im 9/29  brain]
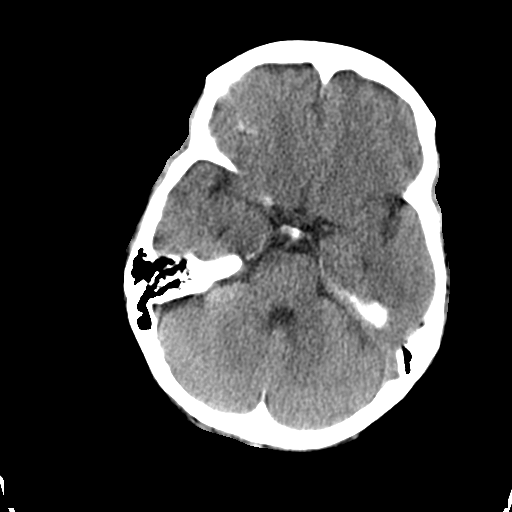
[im 12/29  brain]
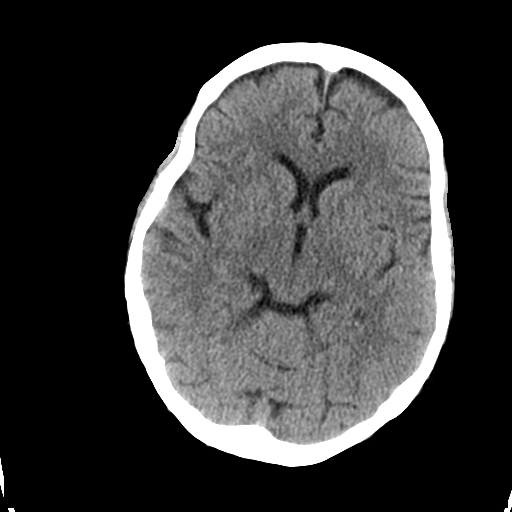
[im 15/29  brain]
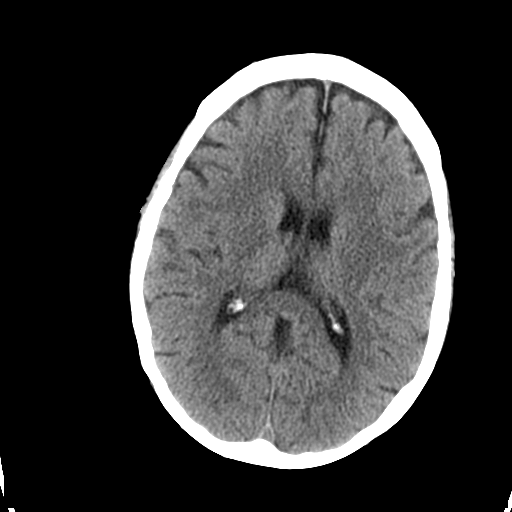
[im 15/29  bone]
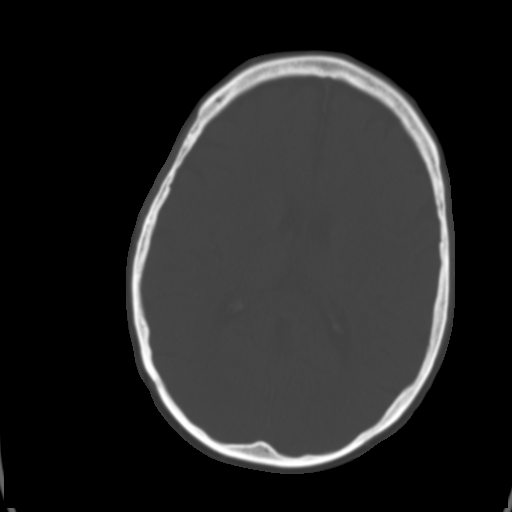
[im 18/29  brain]
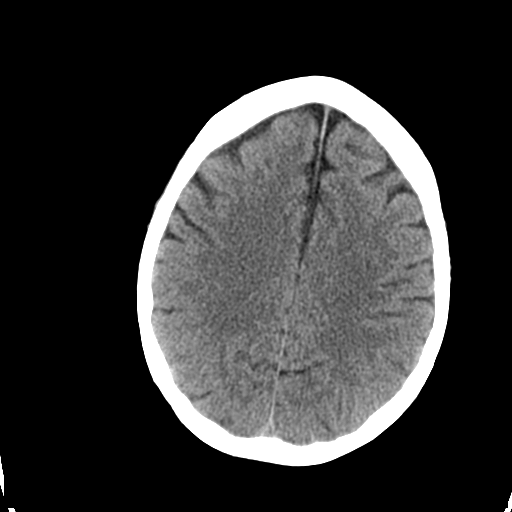
[im 21/29  brain]
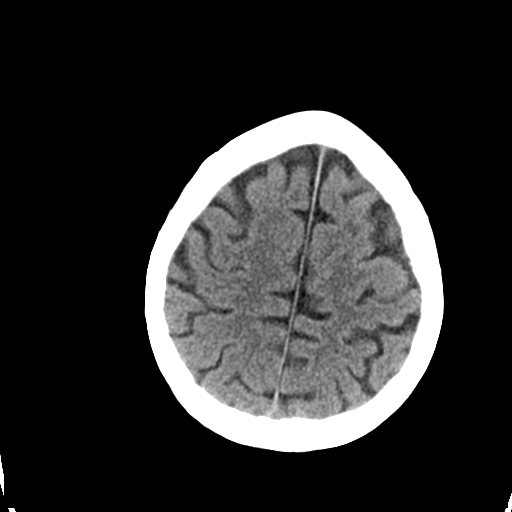
[im 24/29  brain]
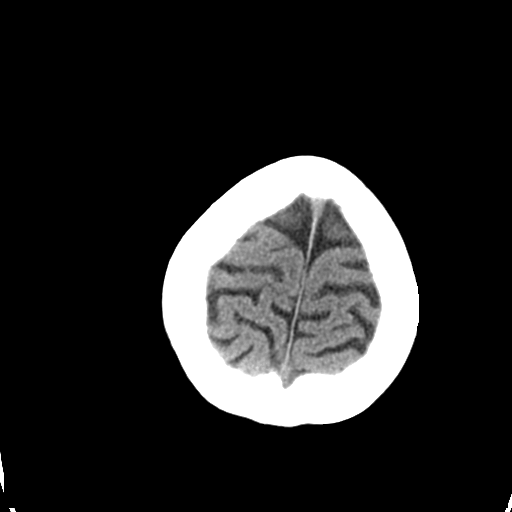
[im 27/29  brain]
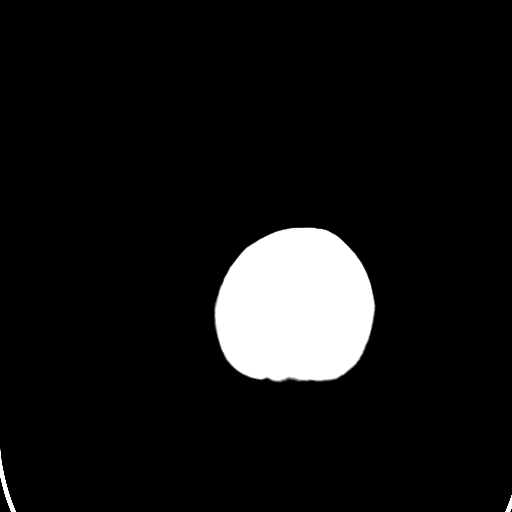
[im 27/29  bone]
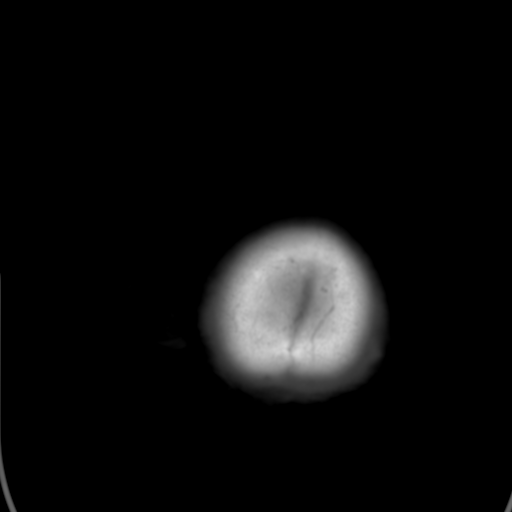

[Series 4: coronal soft tissue · coronal · 0.30mm/px · 3 of 67 slices shown]
[im 23/67  brain]
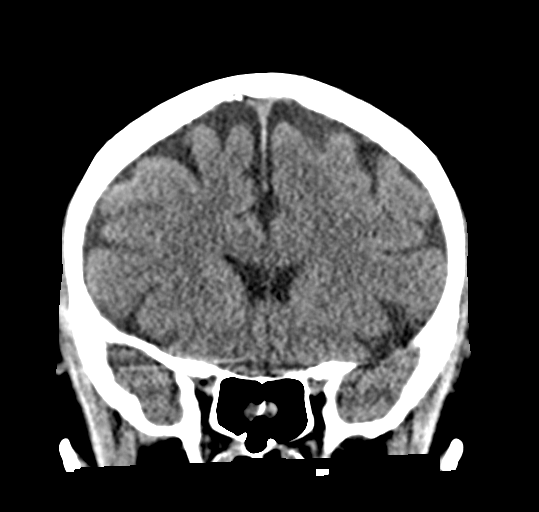
[im 30/67  brain]
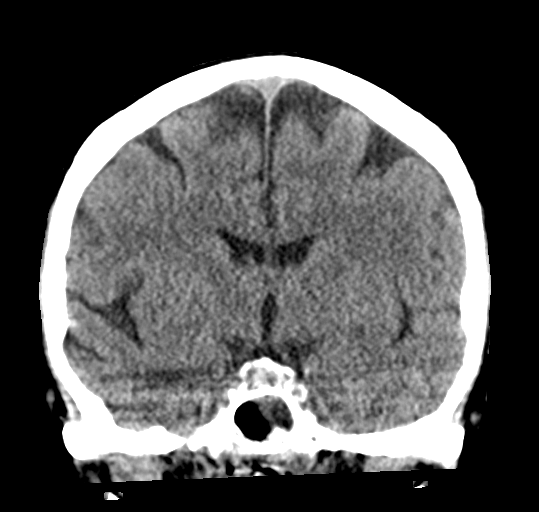
[im 37/67  brain]
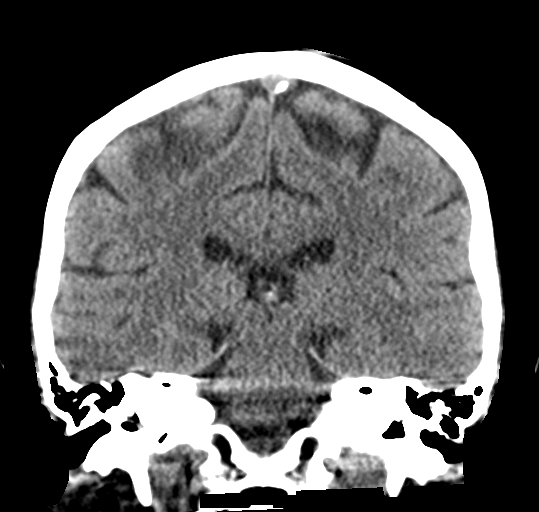

[Series 5: sagittal soft tissue · sagittal · 0.33mm/px · 3 of 54 slices shown]
[im 18/54  brain]
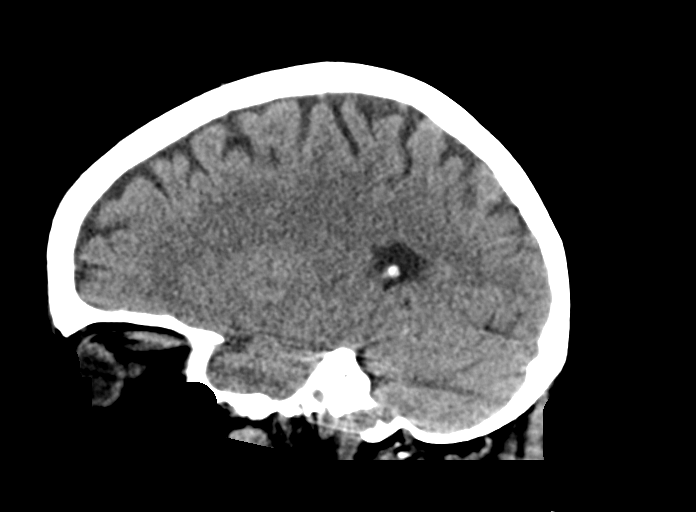
[im 27/54  brain]
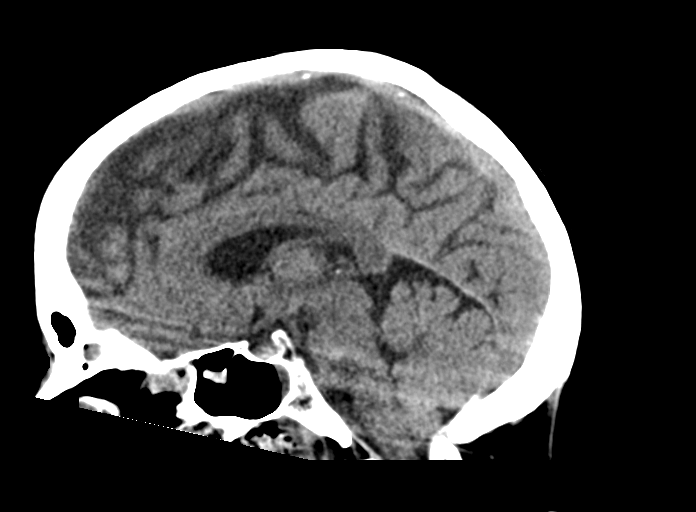
[im 36/54  brain]
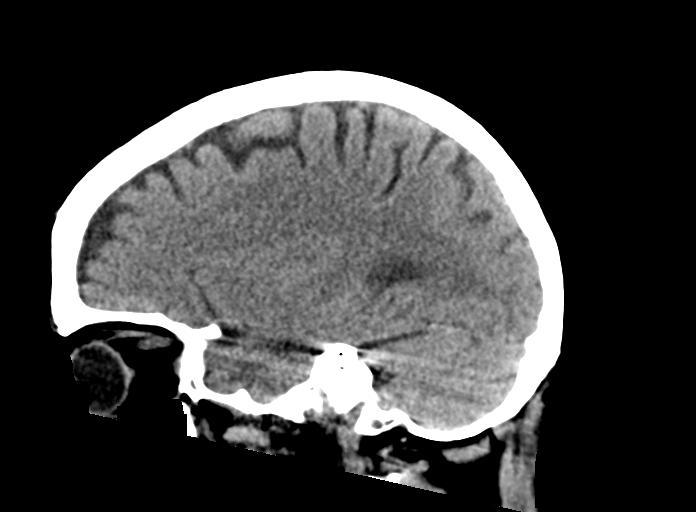

[15 of 46 positions shown; findings below may reference images not displayed]

FINDINGS: Brain: There is no evidence for acute hemorrhage, hydrocephalus,
mass lesion, or abnormal extra-axial fluid collection. No definite
CT evidence for acute infarction.

Vascular: No hyperdense vessel or unexpected calcification.

Skull: No evidence for fracture. No worrisome lytic or sclerotic
lesion.

Sinuses/Orbits: Mucosal disease in the ethmoid air cells and
maxillary sinus has progressed in the interval. Visualized portions
of the globes and intraorbital fat are unremarkable.

Other: None.
IMPRESSION: 1. No acute intracranial abnormality.
2. Interval development of paranasal sinus disease, nonspecific in
the setting of intubation.

## 2017-08-15 IMAGING — CR DG CHEST 1V PORT
1 series · 1 of 1 positions shown · non-contrast
Comparison: Chest x-ray of earlier today.

CLINICAL DATA: Status post orogastric tube placement.

EXAM:
PORTABLE CHEST 1 VIEW

[portable]
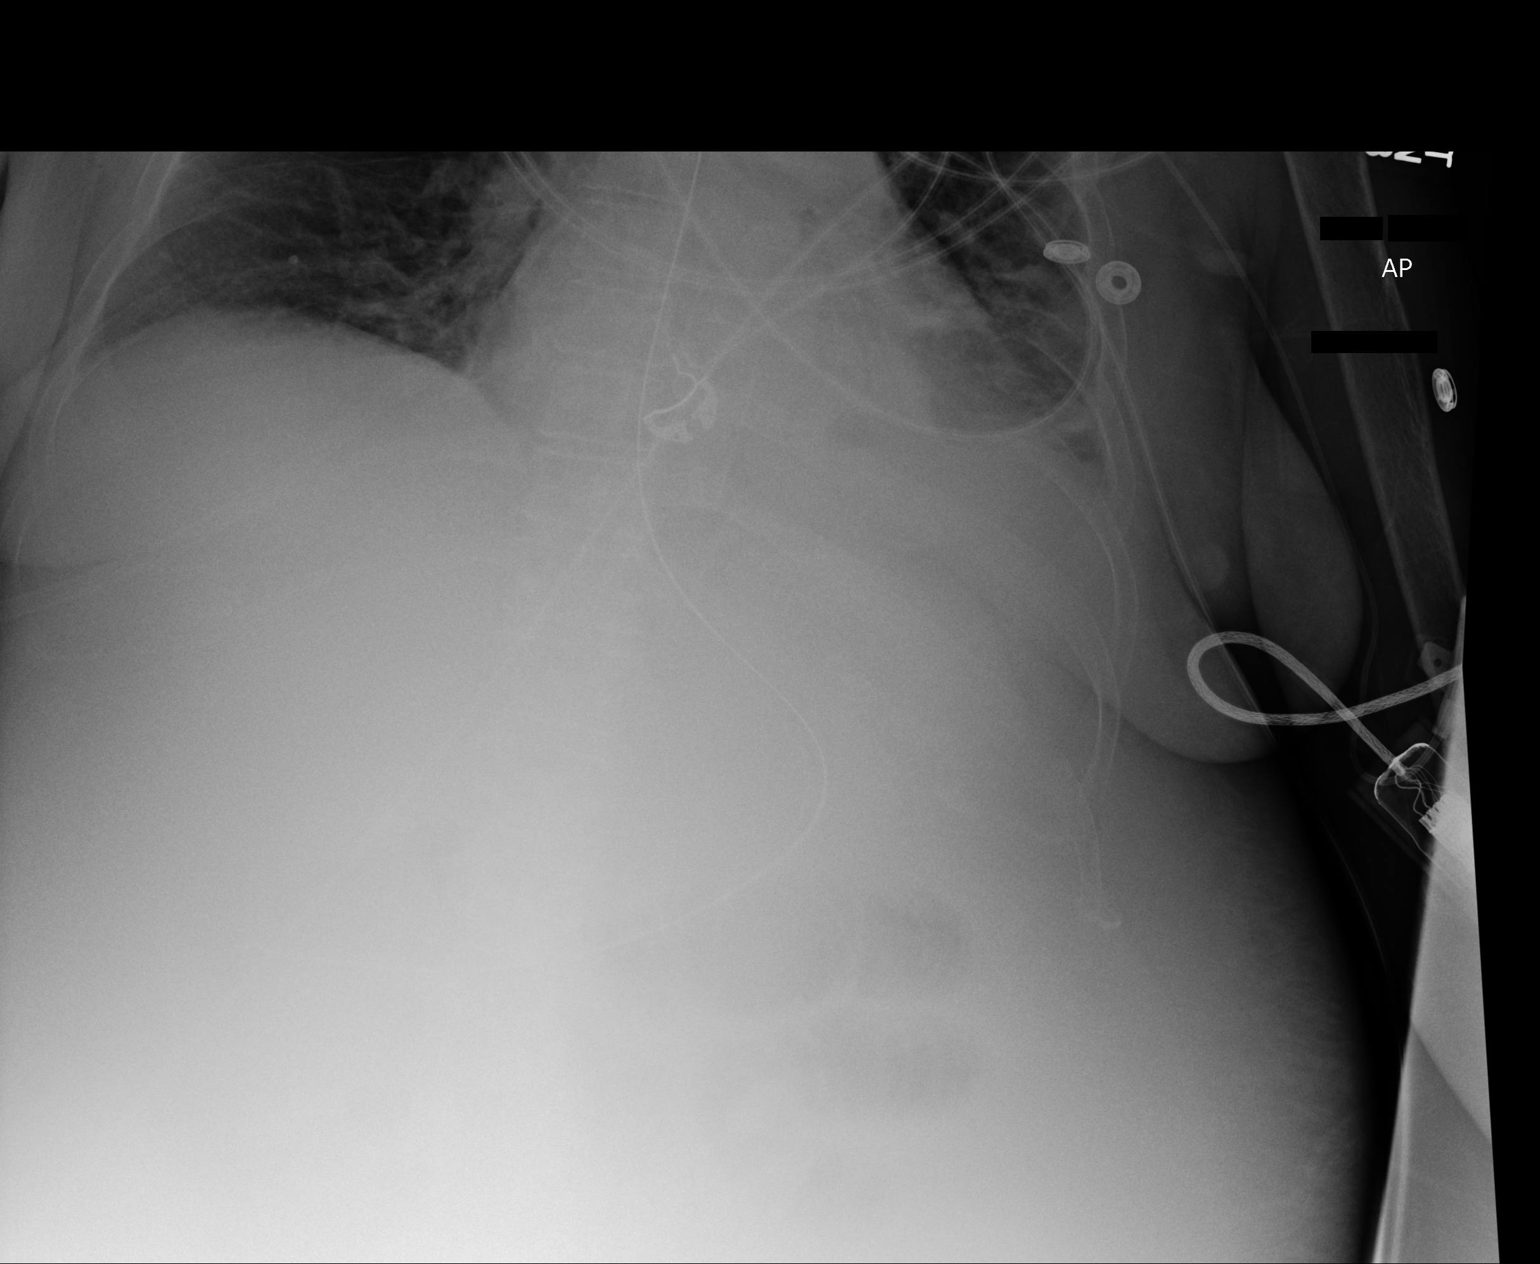

[1 of 1 positions shown; findings below may reference images not displayed]

FINDINGS: The orogastric tube tip projects at approximately the level of the
pylorus or proximal duodenum. The proximal port projects in the
distal gastric body. There are loops of mildly distended gas-filled
small bowel in the left mid abdomen.
IMPRESSION: Reasonable positioning of the orogastric tube.

## 2017-08-15 IMAGING — US US PARACENTESIS
1 series · 3 of 3 positions shown · non-contrast
Comparison: none

INDICATION: Cirrhosis, ascites

[Series 1: us paracentesis · 0.25mm/px · 3 of 3 slices shown]
[im 1/3]
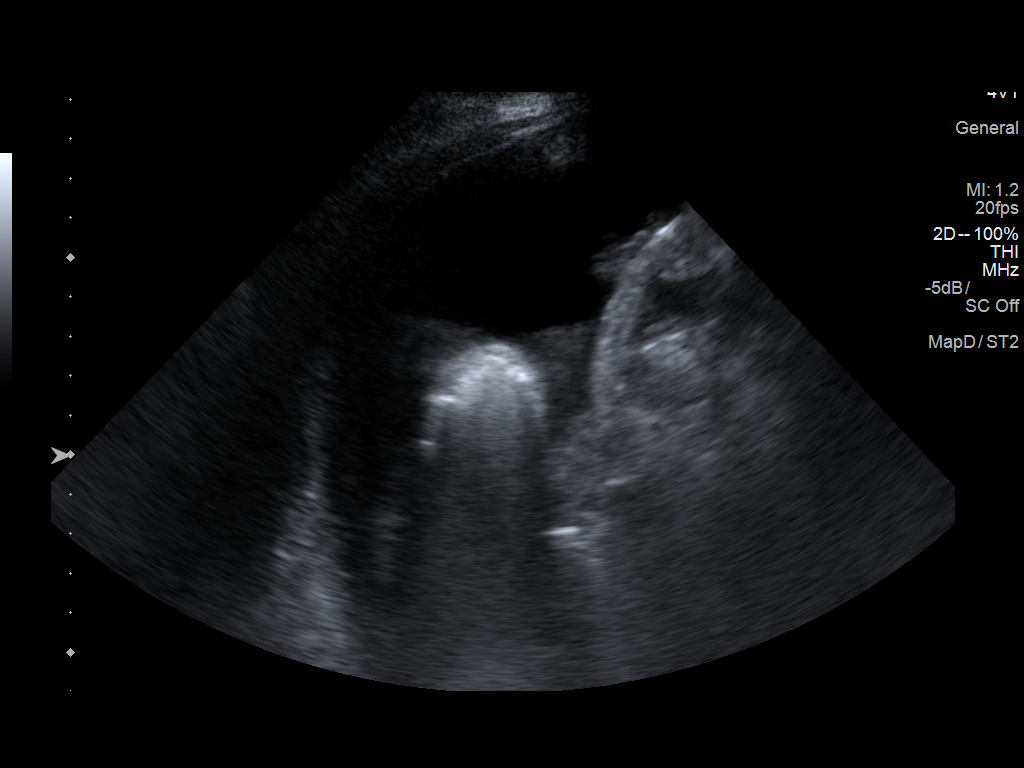
[im 2/3]
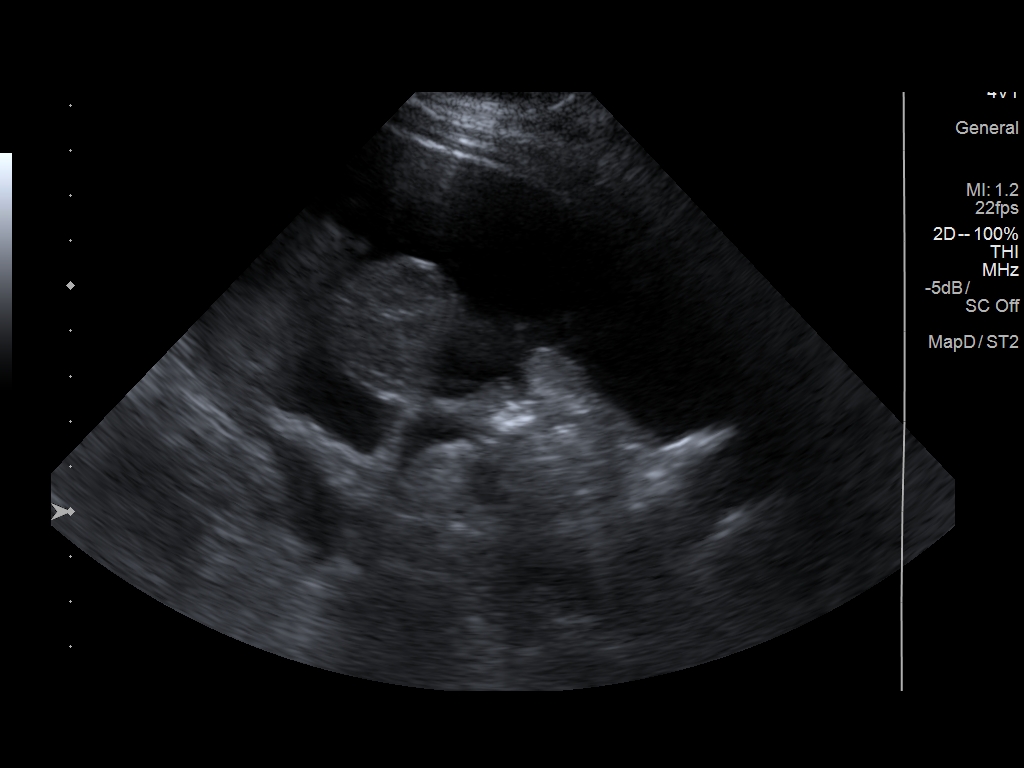
[im 3/3]
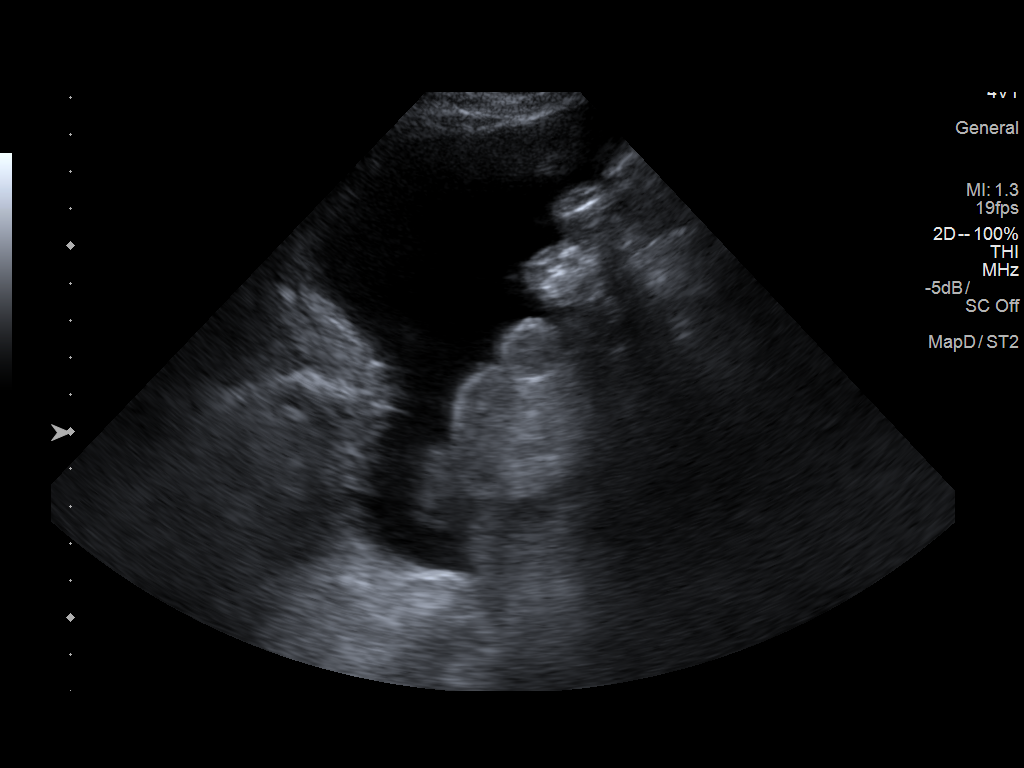

[3 of 3 positions shown; findings below may reference images not displayed]

EXAM:
ULTRASOUND GUIDED DIAGNOSTIC AND THERAPEUTIC PARACENTESIS

MEDICATIONS:
None.

COMPLICATIONS:
None immediate.

PROCEDURE:
Procedure, benefits, and risks of procedure were discussed with
patient.

Written informed consent for procedure was obtained.

Time out protocol followed.

Adequate collection of ascites localized by ultrasound in RIGHT
lower quadrant.

Skin prepped and draped in usual sterile fashion.

Skin and soft tissues anesthetized with 10 mL of 1% lidocaine.

5 French Yueh catheter placed into peritoneal cavity.

2.7 L of dark yellow ascitic fluid aspirated by vacuum bottle
suction.

Procedure tolerated well by patient without immediate complication.
FINDINGS: A total of approximately 2.7 L of ascitic fluid was removed.

Samples were sent to the laboratory as requested by the clinical
team.
IMPRESSION: Successful ultrasound-guided paracentesis yielding 2.7 liters of
peritoneal fluid.

## 2017-08-16 IMAGING — CR DG CHEST 1V PORT
1 series · 1 of 1 positions shown · non-contrast
Comparison: 01/13/2017

CLINICAL DATA: Respiratory failure

EXAM:
PORTABLE CHEST 1 VIEW

[portable]
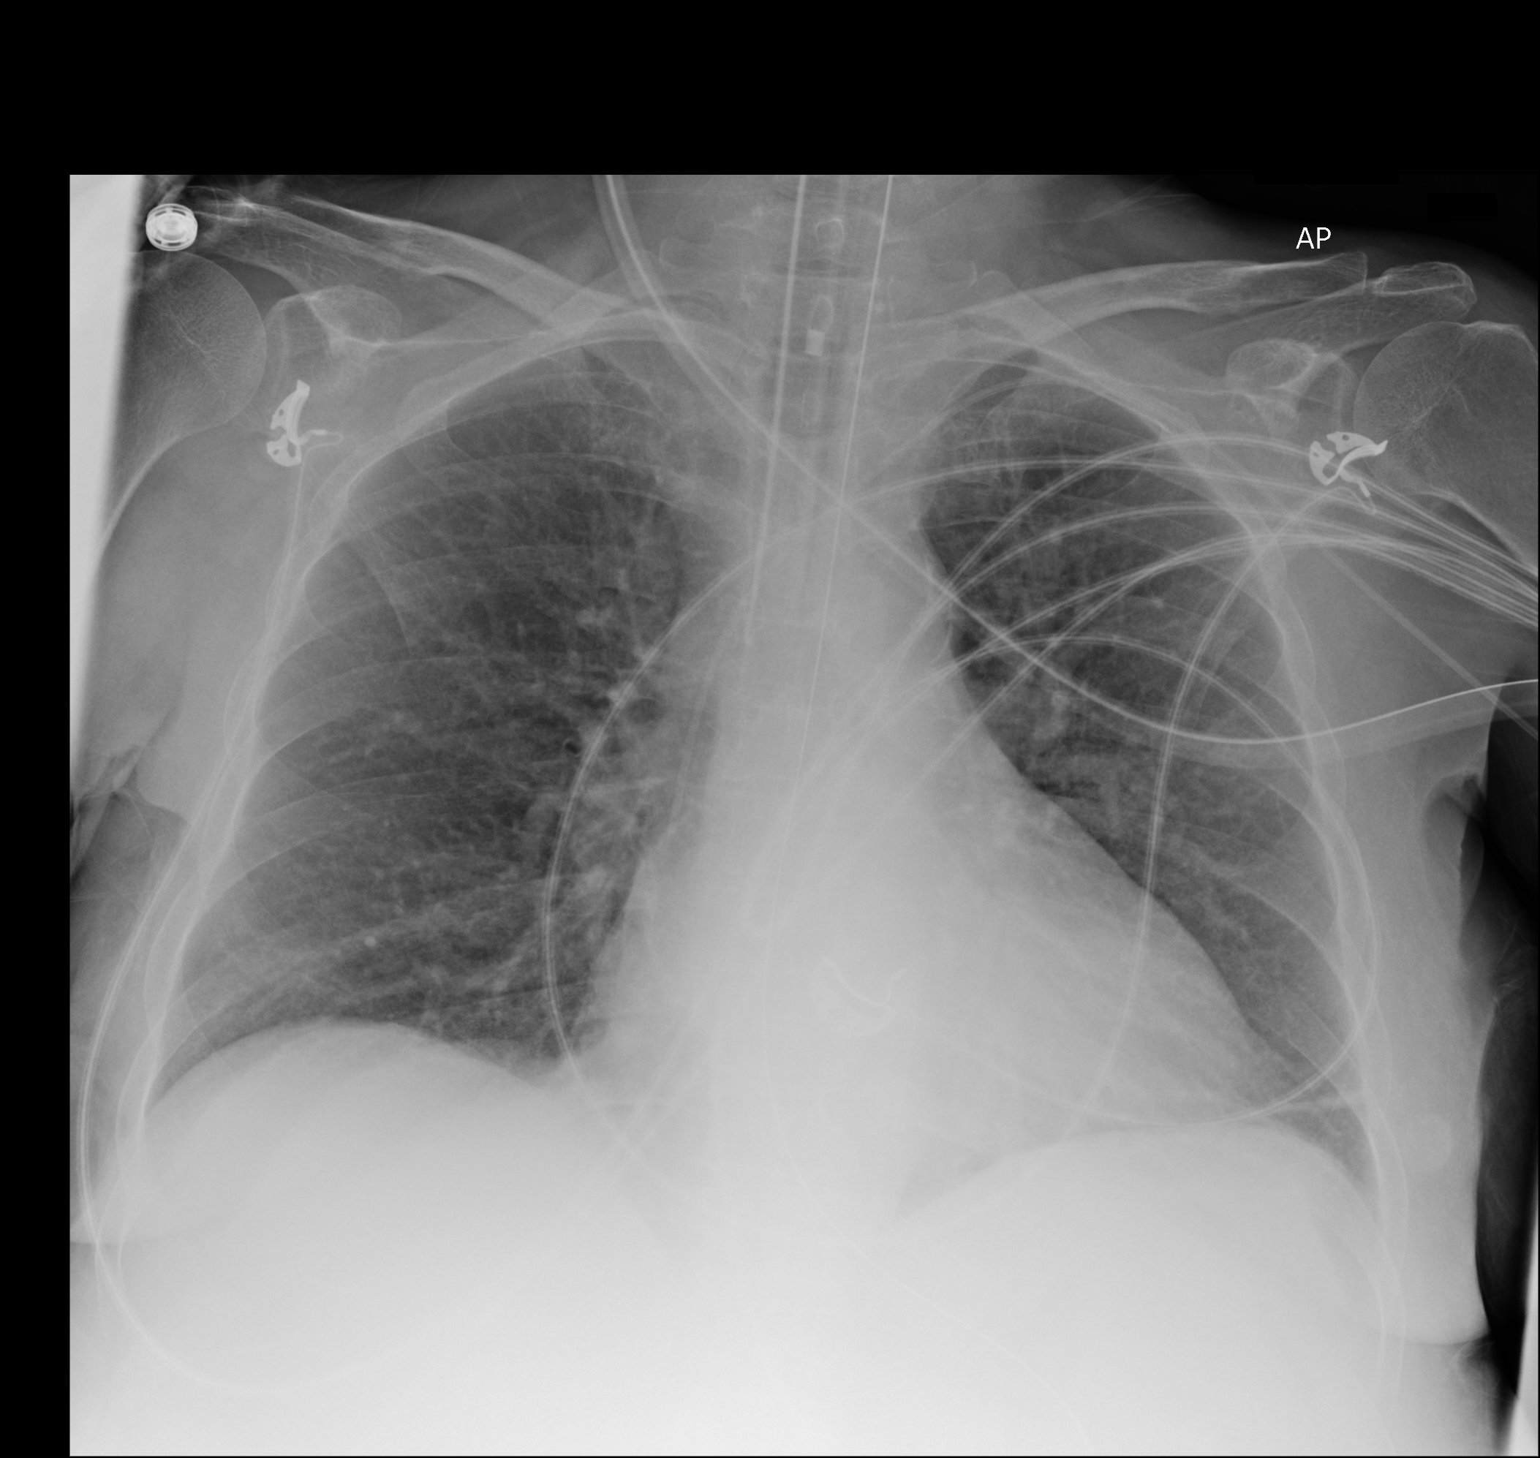

[1 of 1 positions shown; findings below may reference images not displayed]

FINDINGS: ET tube tip is above the carina. There is a left arm PICC line with
tip in the cavoatrial junction. OG tube tip is in the stomach.
Aortic atherosclerosis. Normal heart size. Decrease in left pleural
effusion and pulmonary vascular congestion.
IMPRESSION: 1. Decrease in left pleural effusion and pulmonary vascular
congestion.

## 2017-08-17 IMAGING — CR DG CHEST 1V PORT
1 series · 1 of 1 positions shown · non-contrast
Comparison: 01/14/2017

CLINICAL DATA: Respiratory failure

EXAM:
PORTABLE CHEST 1 VIEW

[portable]
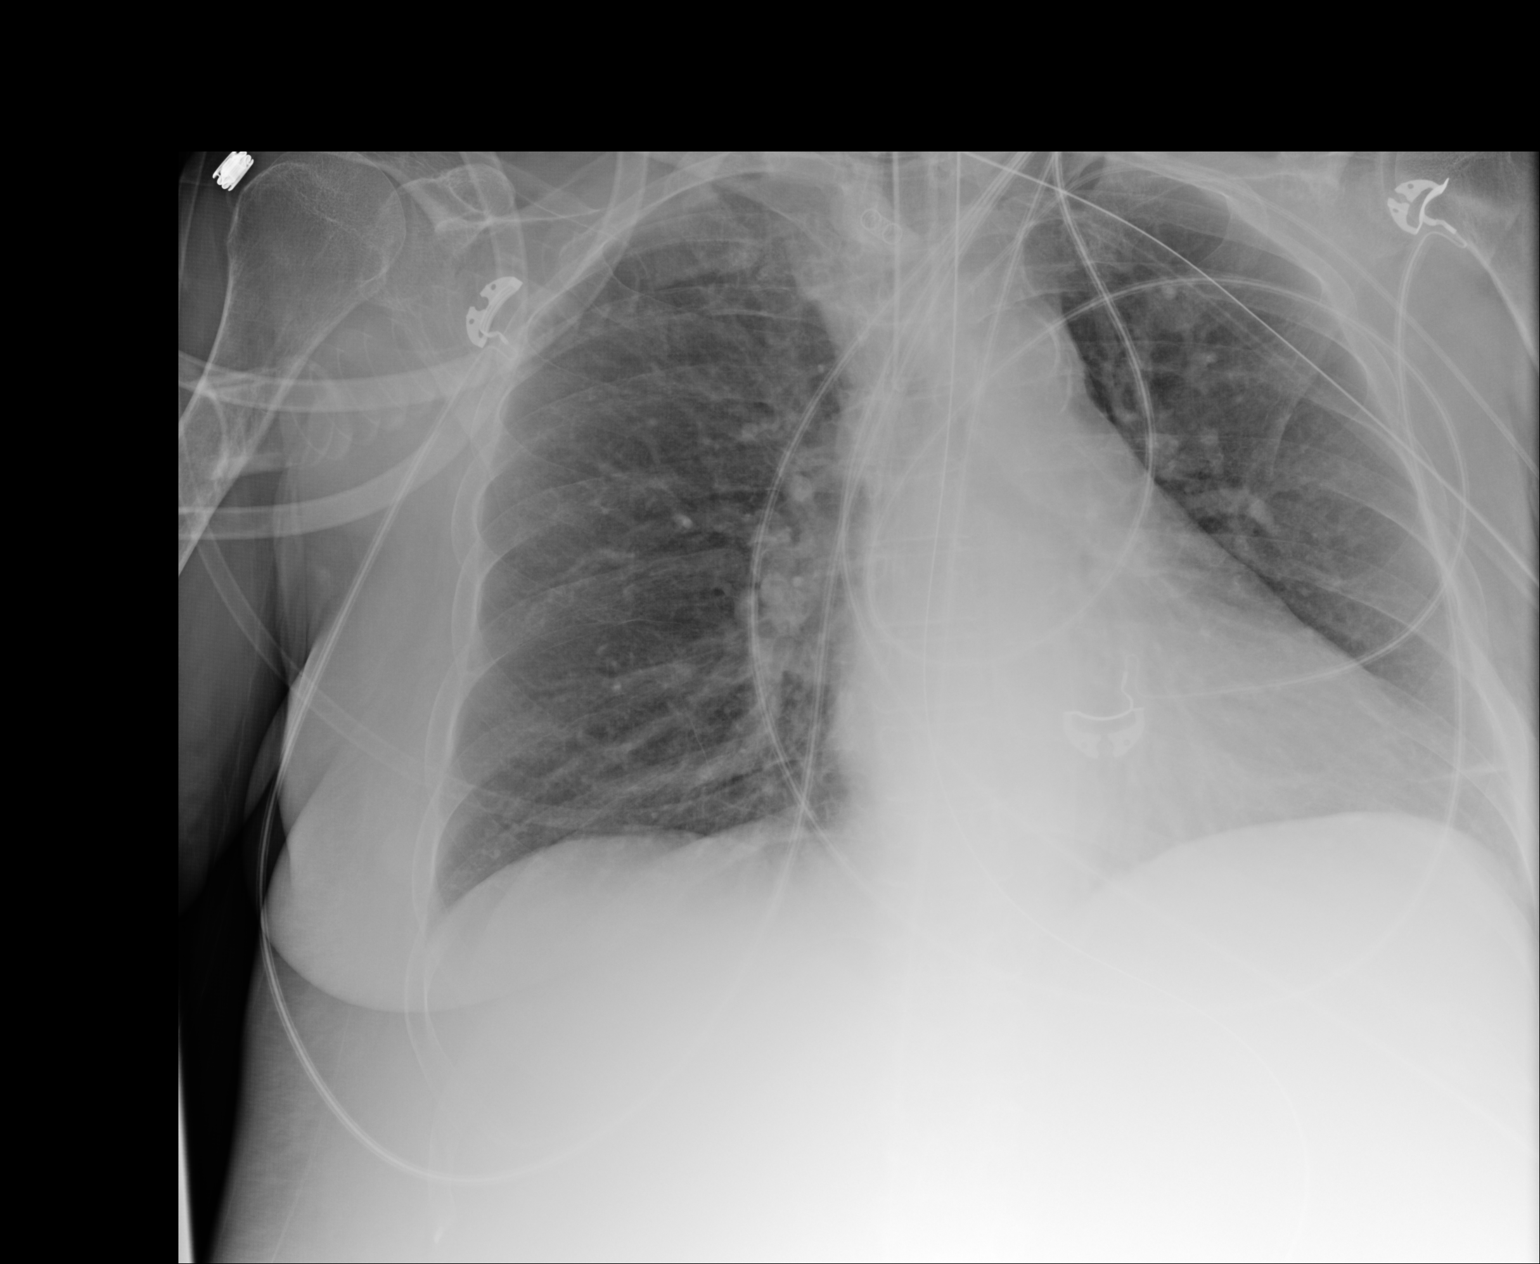

[1 of 1 positions shown; findings below may reference images not displayed]

FINDINGS: Cardiac shadow is stable. Endotracheal tube and nasogastric catheter
are noted in satisfactory position. The lungs are well aerated
bilaterally. Minimal platelike atelectasis is noted in the left
base. No new focal abnormality is seen.
IMPRESSION: Minimal left basilar atelectasis. No new focal abnormality is noted.

## 2017-08-18 IMAGING — CR DG CHEST 1V PORT
1 series · 1 of 1 positions shown · non-contrast
Comparison: January 15, 2017

CLINICAL DATA: Respiratory failure

EXAM:
PORTABLE CHEST 1 VIEW

[portable]
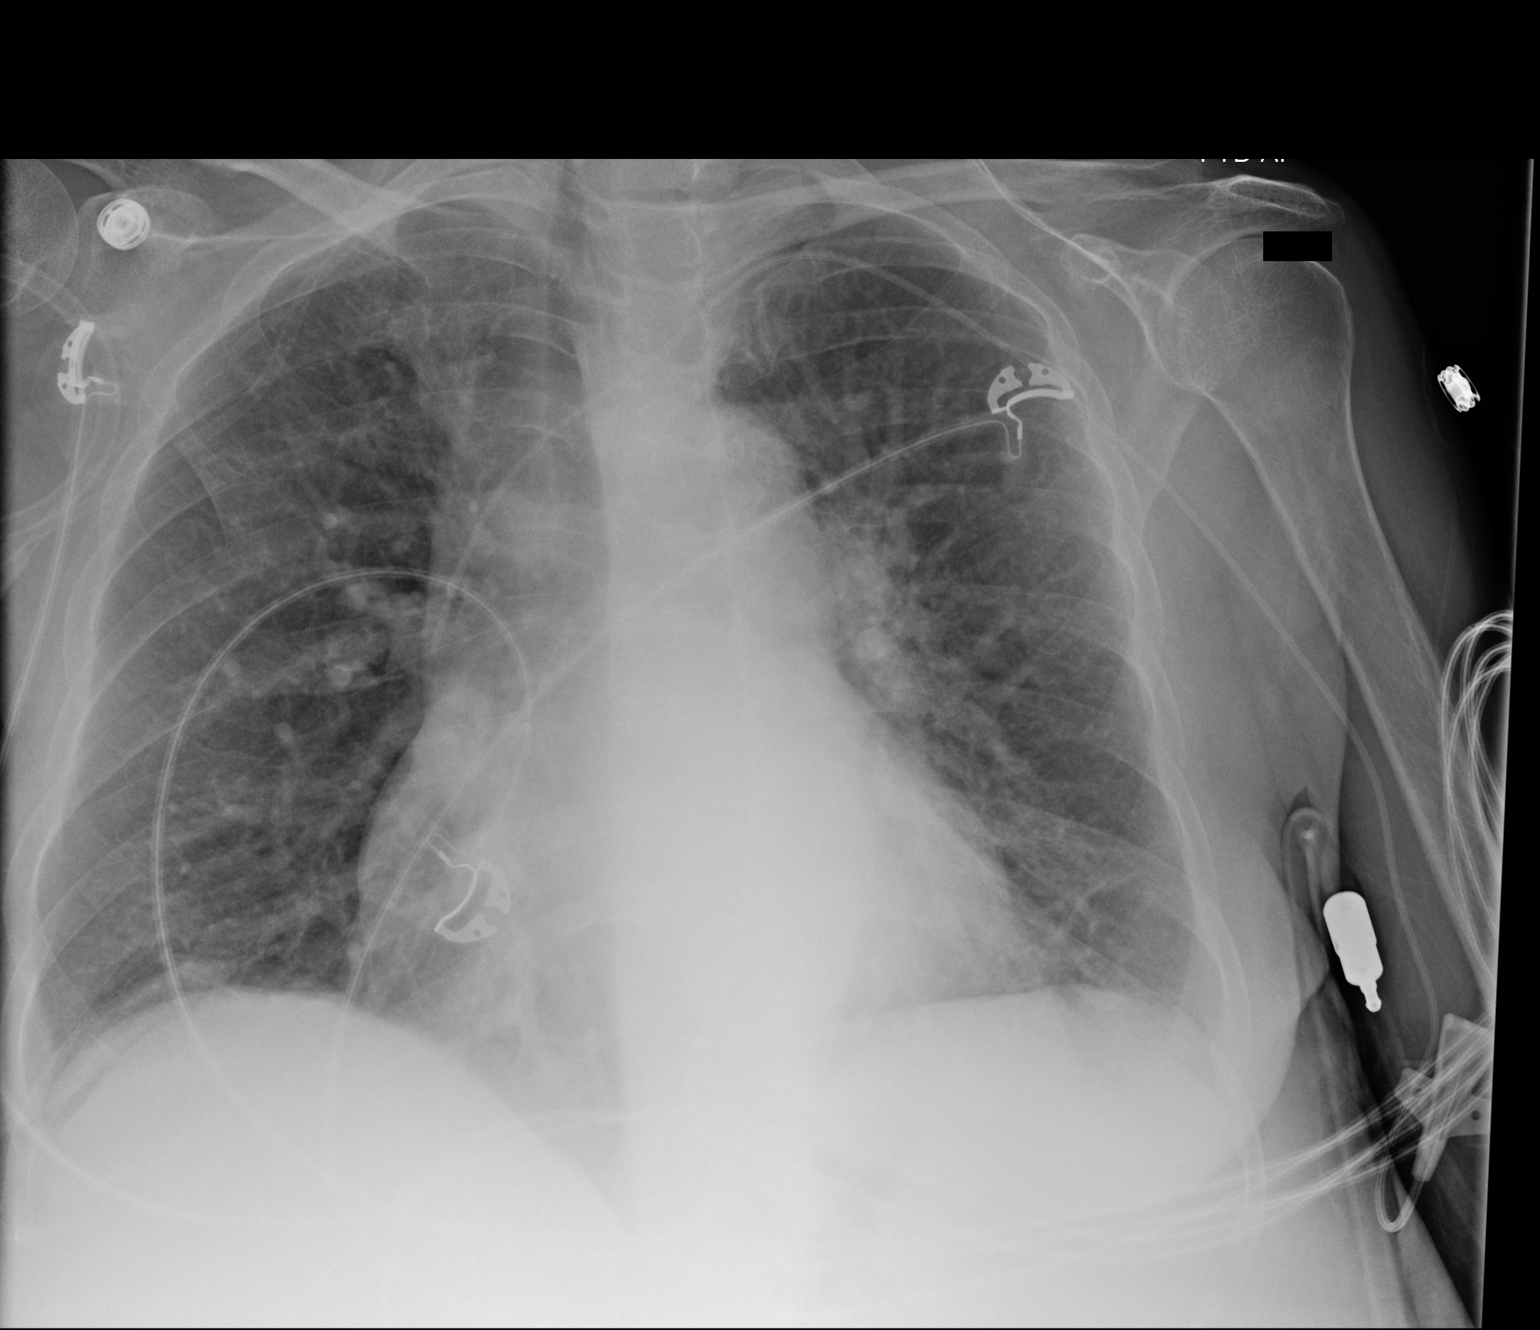

[1 of 1 positions shown; findings below may reference images not displayed]

FINDINGS: Endotracheal tube and nasogastric tube have been removed. No
pneumothorax. Central catheter tip is in the superior vena cava,
unchanged. There is mild left base atelectasis. There is trace
interstitial edema. No consolidation. There is cardiomegaly with
pulmonary venous hypertension. No adenopathy. No bone lesions. There
is aortic atherosclerosis.
IMPRESSION: Central catheter tip in superior vena cava. No pneumothorax.
Evidence of a degree of congestive heart failure with mild
interstitial edema. Stable cardiac silhouette. Mild left base
atelectasis noted. There is aortic atherosclerosis.

## 2017-10-02 IMAGING — DX DG CHEST 2V
2 series · 2 of 2 positions shown · non-contrast
Comparison: Radiographs 01/16/2017, additional priors.

CLINICAL DATA: Left-sided chest pain.

EXAM:
CHEST  2 VIEW

[chest lat]
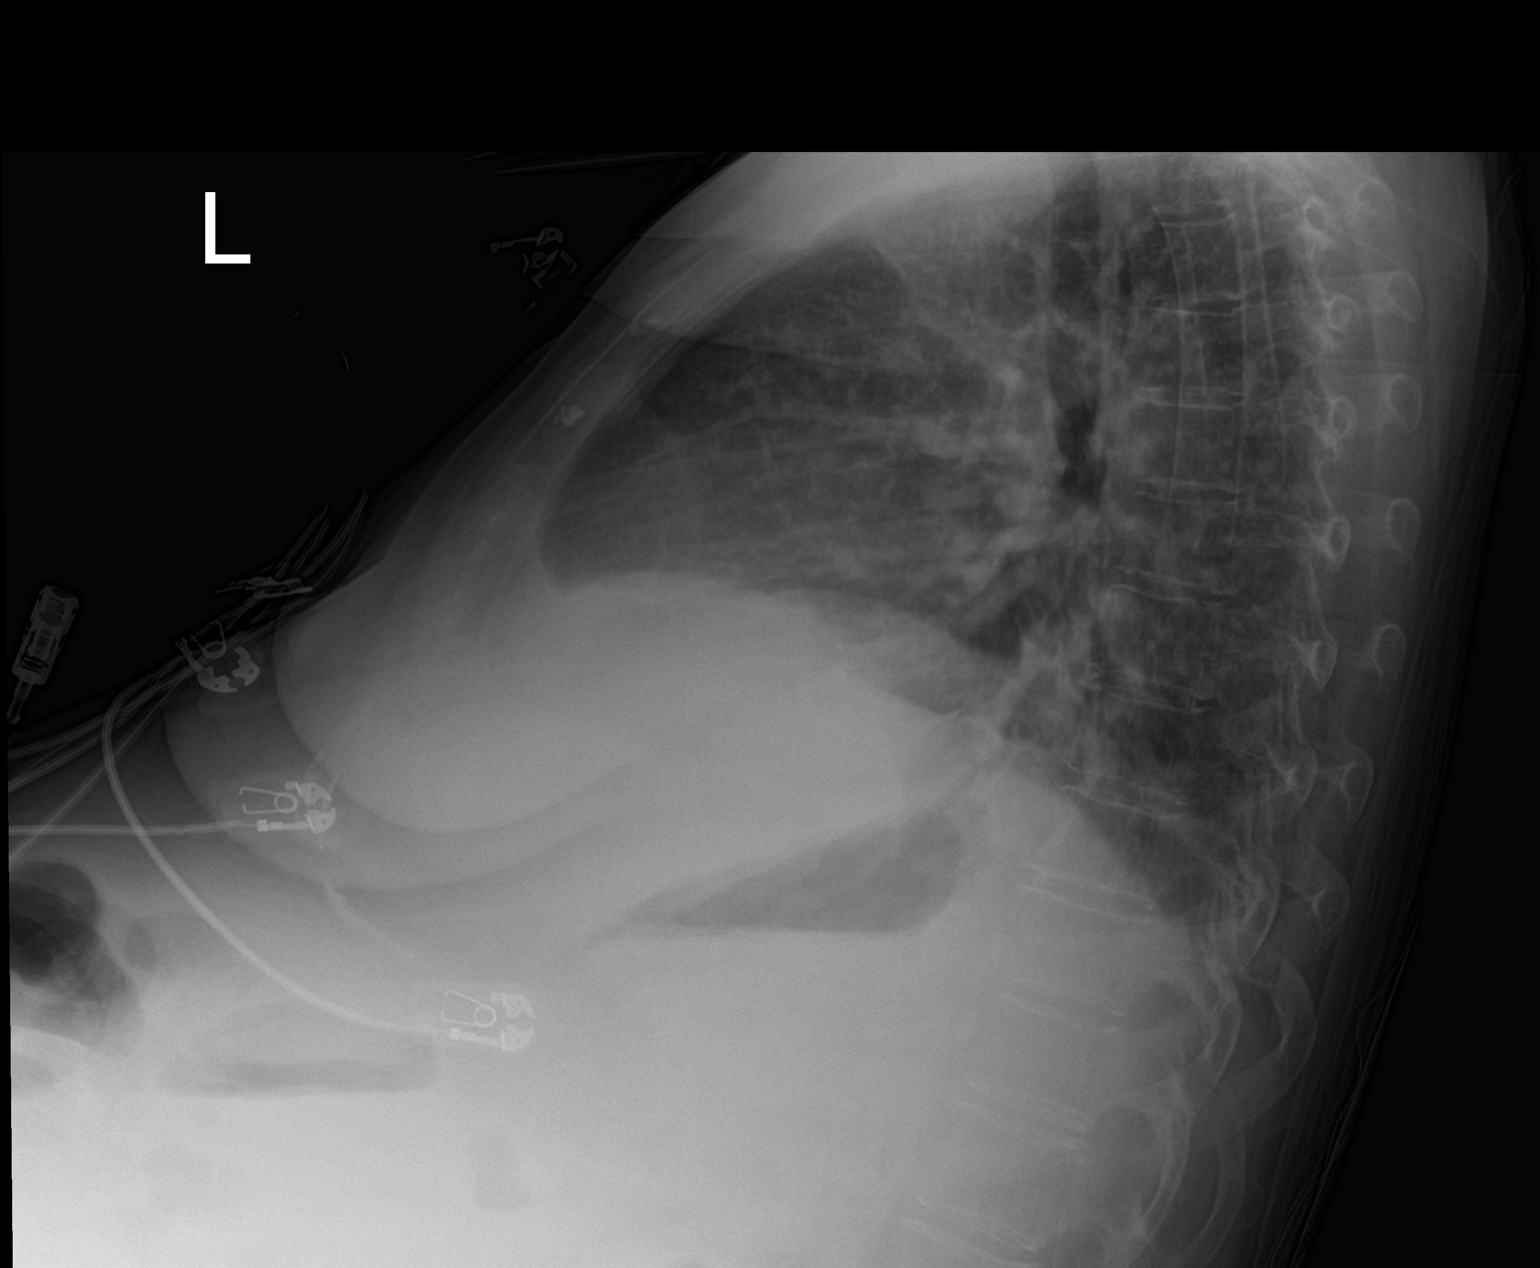

[chest ap]
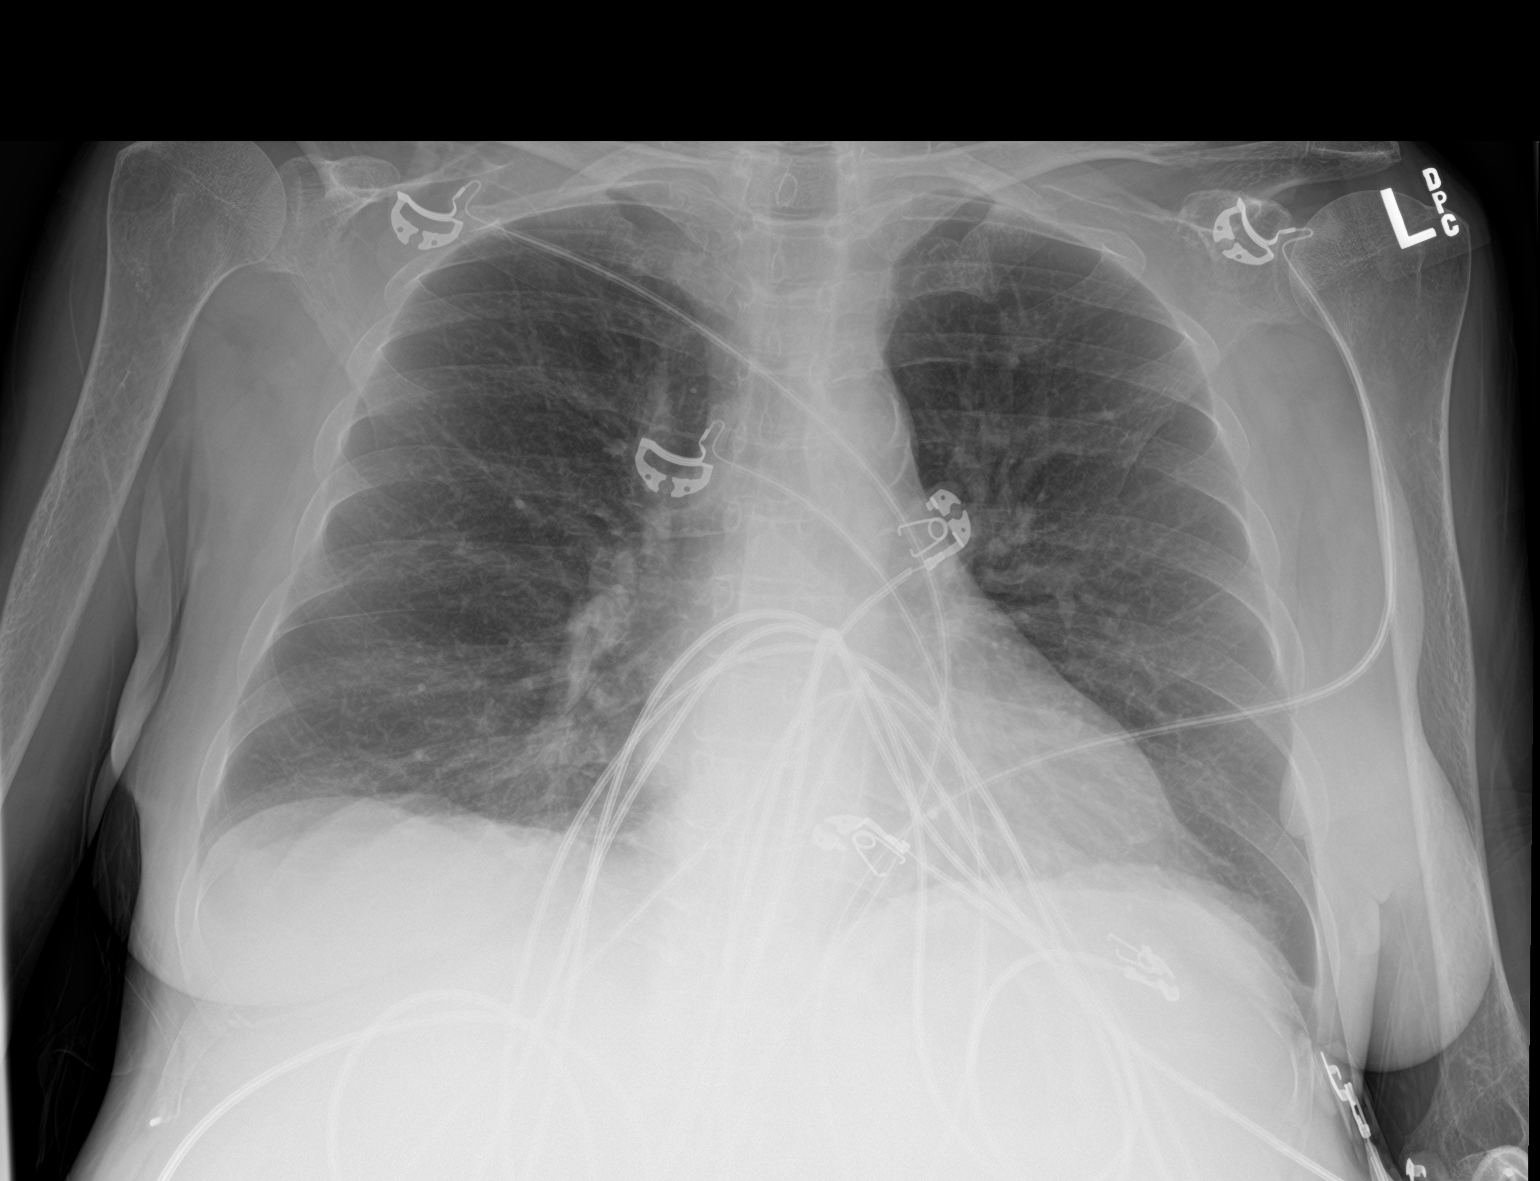

[2 of 2 positions shown; findings below may reference images not displayed]

FINDINGS: Decreased cardiomegaly from prior exam. Again seen atherosclerosis
of the aortic arch. Improved vascular congestion from prior exam. No
focal airspace disease. No large pleural effusion. No pneumothorax.
Bones are under mineralized.
IMPRESSION: 1. Decreased cardiomegaly and vascular congestion from prior exam.
2. Aortic atherosclerosis.

## 2017-10-31 IMAGING — US US PARACENTESIS
1 series · 1 of 1 positions shown · non-contrast
Comparison: none

INDICATION: Cirrhosis, ascites

[Series 1: us paracentesis · 0.28mm/px · 1 of 1 slices shown]
[im 1/1]
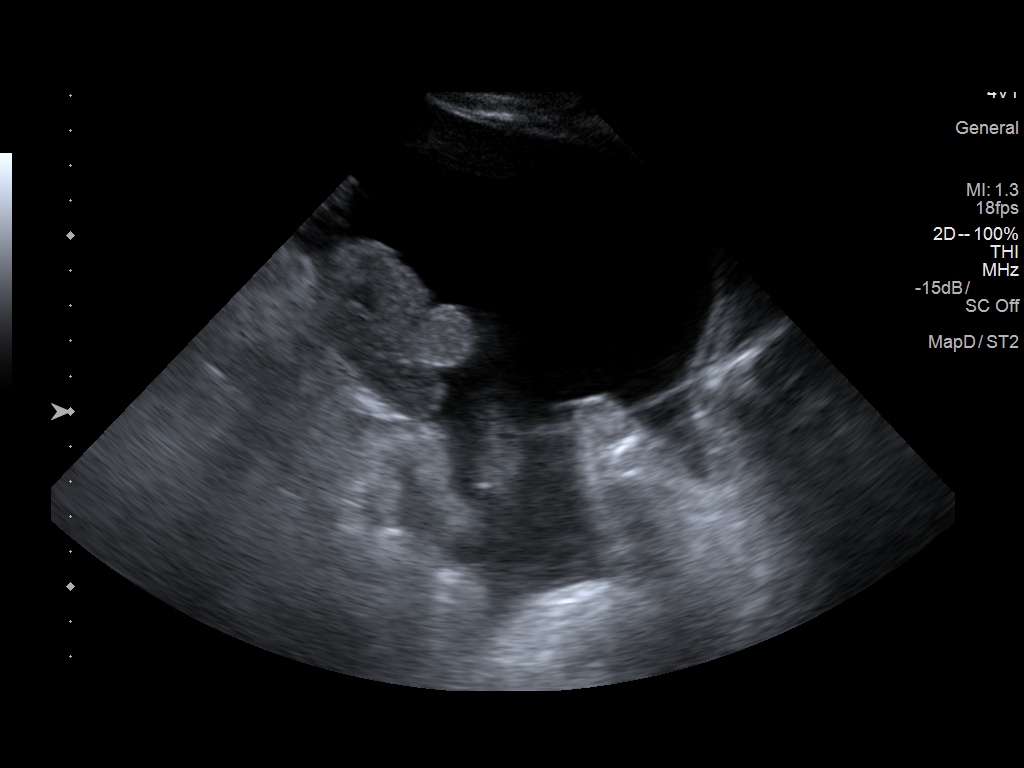

[1 of 1 positions shown; findings below may reference images not displayed]

EXAM:
ULTRASOUND GUIDED THERAPEUTIC PARACENTESIS

MEDICATIONS:
None.

COMPLICATIONS:
None immediate.

PROCEDURE:
Procedure, benefits, and risks of procedure were discussed with
patient.

Written informed consent for procedure was obtained.

Time out protocol followed.

Adequate collection of ascites localized by ultrasound in RIGHT
lower quadrant.

Skin prepped and draped in usual sterile fashion.

Skin and soft tissues anesthetized with 10 mL of 1% lidocaine.

5 French Yueh catheter placed into peritoneal cavity.

4.9 L of yellow colored ascitic fluid aspirated by vacuum bottle
suction.

Procedure tolerated well by patient without immediate complication.
FINDINGS: As above
IMPRESSION: Successful ultrasound-guided paracentesis yielding 4.9 liters of
peritoneal fluid.

## 2017-11-04 IMAGING — CR DG PORTABLE PELVIS
1 series · 1 of 1 positions shown · non-contrast
Comparison: CT from 11/23/2016

CLINICAL DATA: Fall.

EXAM:
PORTABLE PELVIS 1-2 VIEWS

[ap]
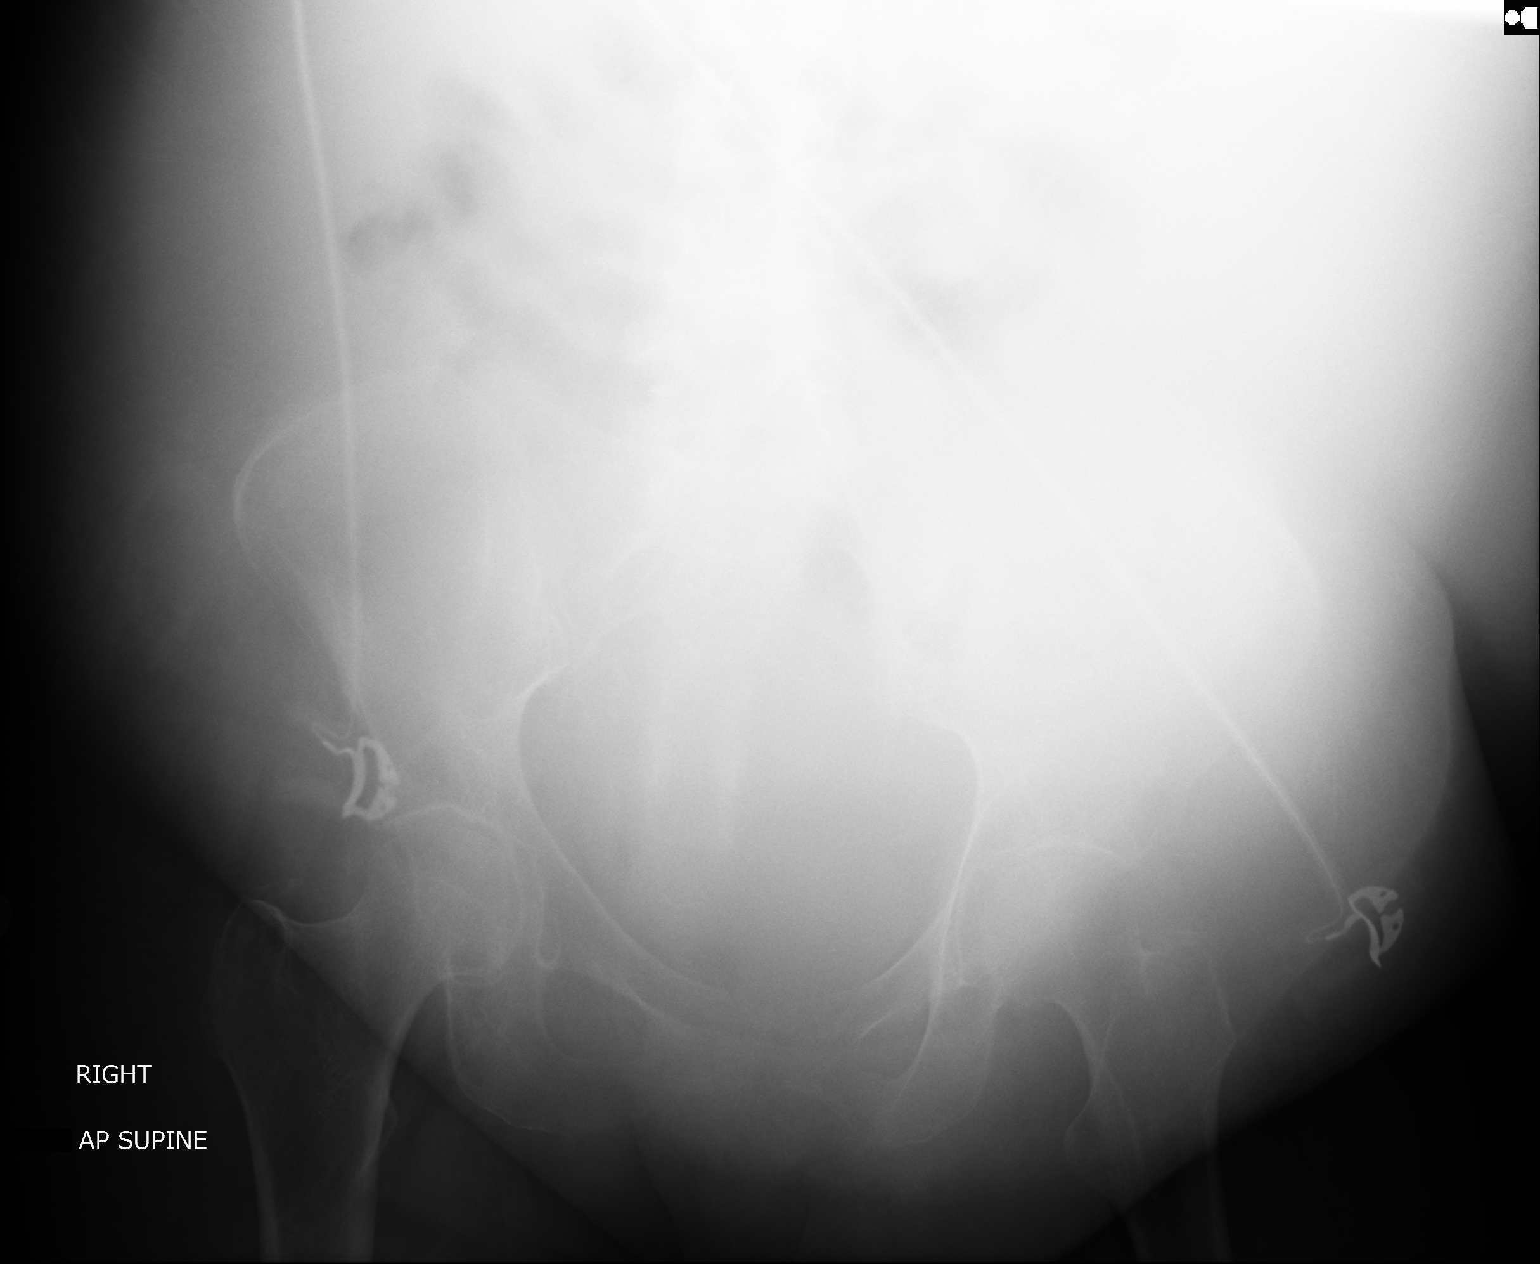

[1 of 1 positions shown; findings below may reference images not displayed]

FINDINGS: Bone detail is limited due to extensive soft tissue. Pelvic bony
ring appears to be grossly intact. Normal alignment of both hips. No
gross hip fracture.
IMPRESSION: Limited examination of the pelvis as described. No gross acute
abnormality.

## 2017-11-04 IMAGING — CR DG CHEST 1V PORT
1 series · 1 of 1 positions shown · non-contrast
Comparison: 03/02/2017.

CLINICAL DATA: Patient fell.  Pain.

EXAM:
PORTABLE CHEST 1 VIEW

[portable]
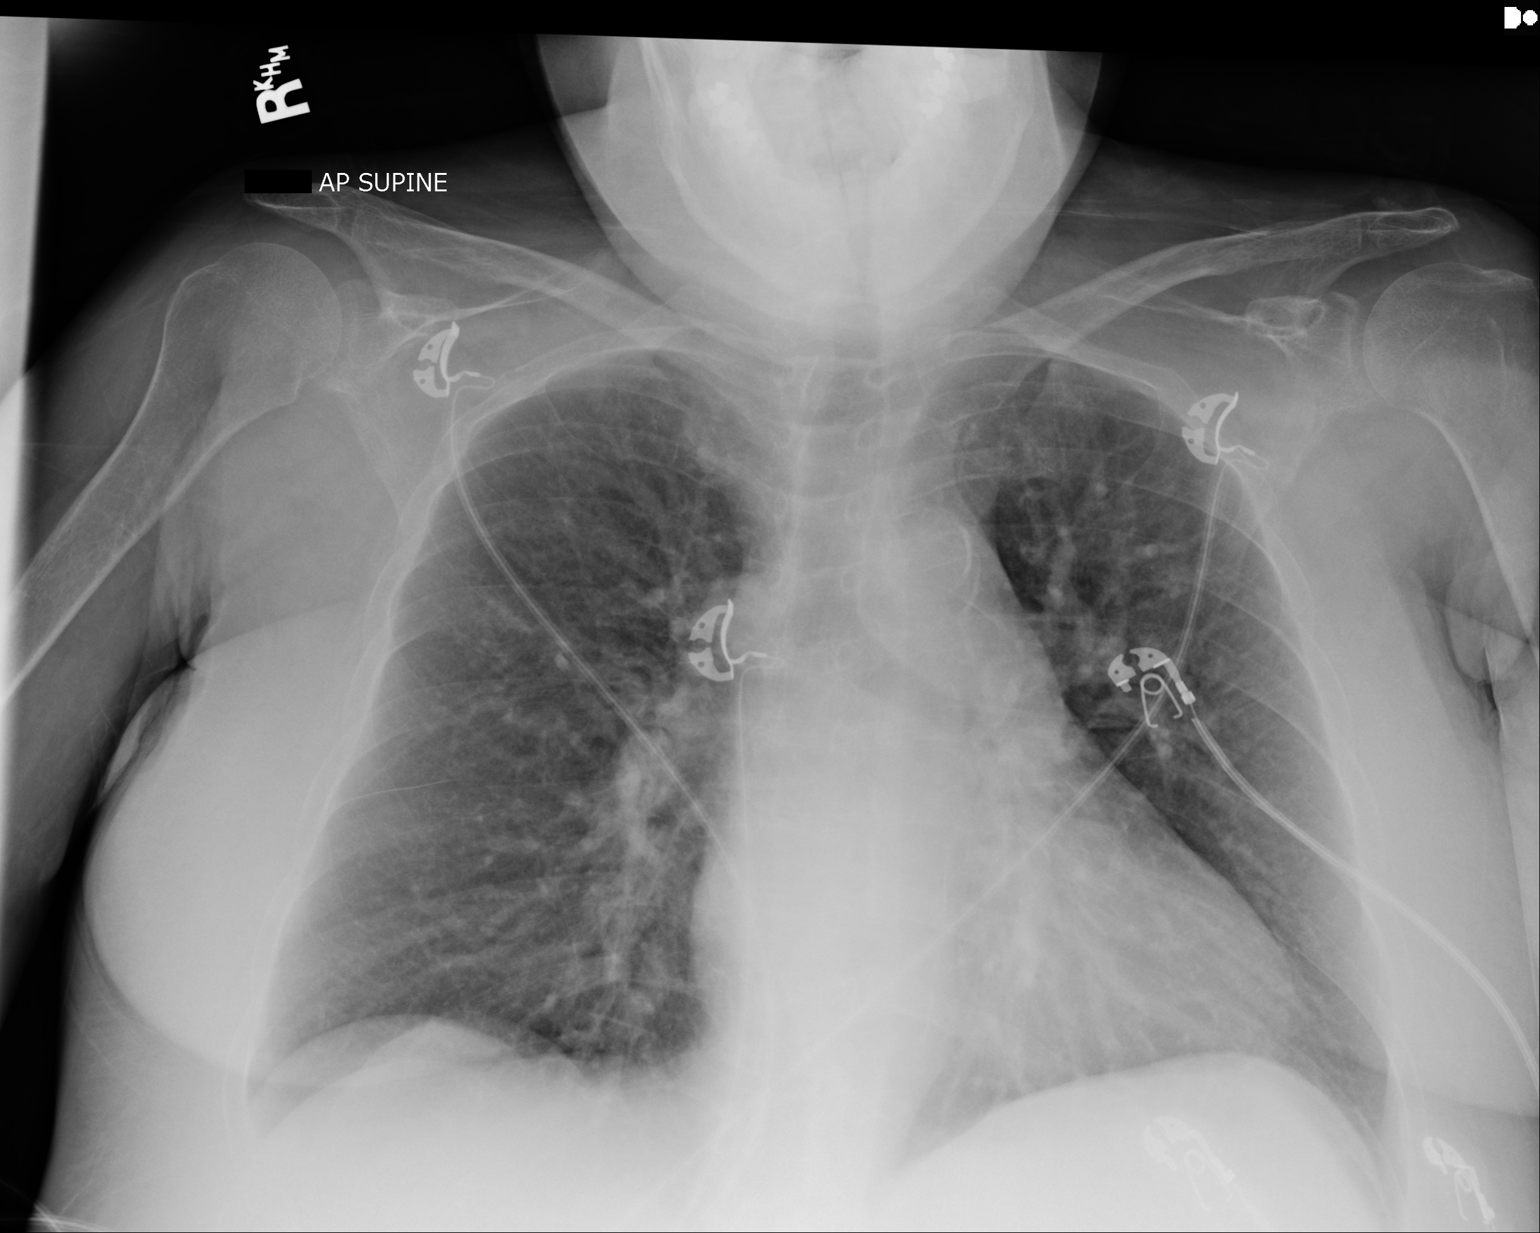

[1 of 1 positions shown; findings below may reference images not displayed]

FINDINGS: Mild cardiac enlargement. Mild vascular congestion. No overt failure
or infiltrates. No visible rib fracture. No pneumothorax. Thoracic
atherosclerosis.
IMPRESSION: Mild cardiac enlargement.  No active infiltrates or failure.

## 2017-11-06 IMAGING — US US PARACENTESIS
1 series · 2 of 2 positions shown · non-contrast
Comparison: none

INDICATION: Cirrhosis due to NASH, recurrent ascites

[Series 1: us paracentesis · 0.38mm/px · 2 of 2 slices shown]
[im 1/2]
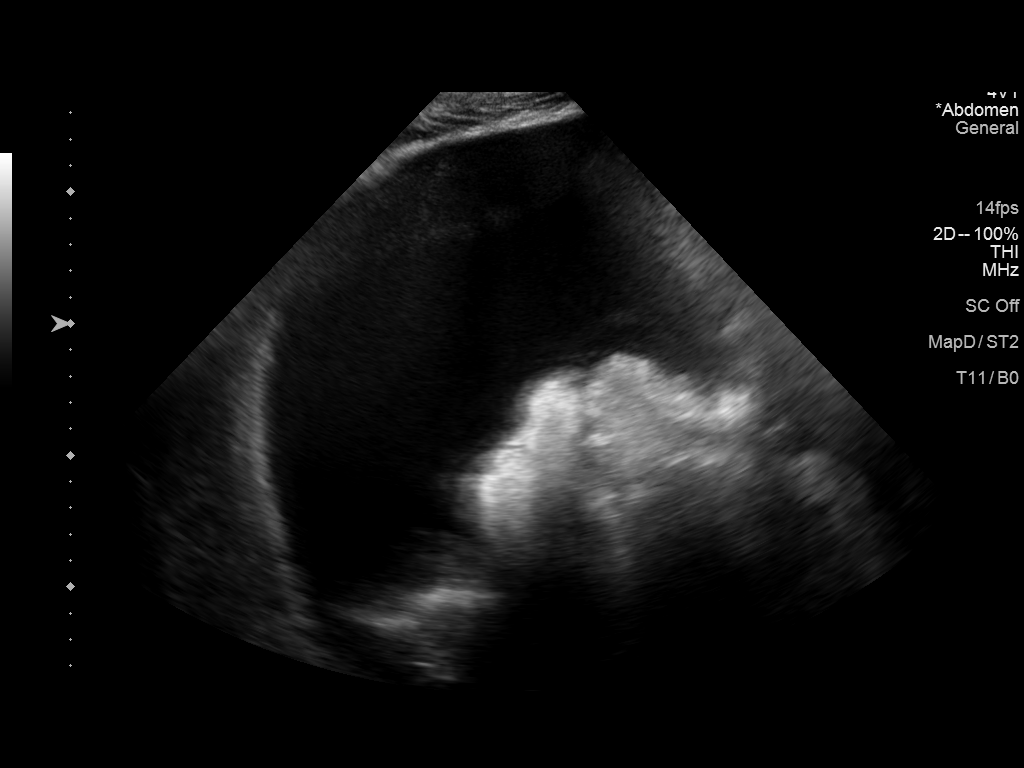
[im 2/2]
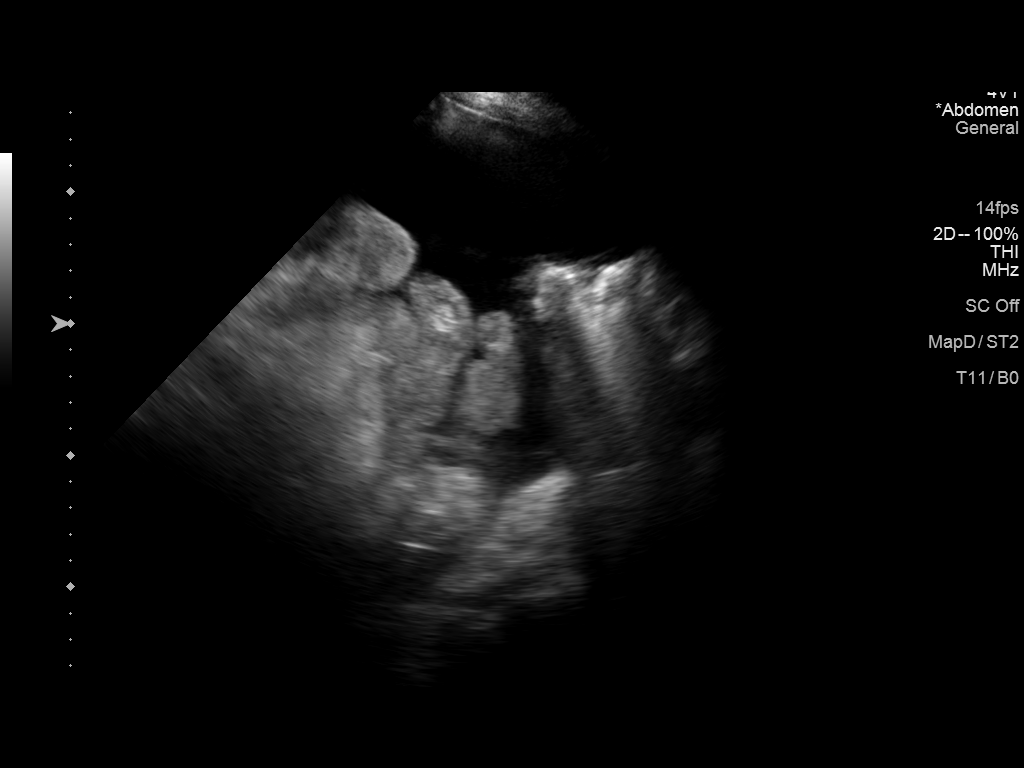

[2 of 2 positions shown; findings below may reference images not displayed]

EXAM:
ULTRASOUND GUIDED DIAGNOSTIC AND THERAPEUTIC PARACENTESIS

MEDICATIONS:
None.

COMPLICATIONS:
None immediate.

PROCEDURE:
Procedure performed portably in the ICU.

Procedure, benefits, and risks of procedure were discussed with
patient.

Written informed consent for procedure was obtained.

Time out protocol followed.

Adequate collection of ascites localized by ultrasound in RIGHT
lower quadrant.

Skin prepped and draped in usual sterile fashion.

Skin and soft tissues anesthetized with 10 mL of 1% lidocaine.

5 French Yueh catheter placed into peritoneal cavity.

9.2 L of yellow ascitic fluid aspirated by vacuum bottle suction.

Procedure tolerated well by patient without immediate complication.
FINDINGS: A total of approximately 9.2 L of ascitic fluid was removed.

Samples were sent to the laboratory as requested by the clinical
team.
IMPRESSION: Successful ultrasound-guided paracentesis yielding 9.2 liters of
peritoneal fluid.

## 2018-12-06 NOTE — Progress Notes (Signed)
REVIEWED-NO ADDITIONAL RECOMMENDATIONS.
